# Patient Record
Sex: Male | Born: 1970 | Race: Black or African American | Hispanic: No | Smoking: Current every day smoker
Health system: Southern US, Community
[De-identification: ages and names within clinical notes are randomized; demographics above are authoritative.]

## PROBLEM LIST (undated history)

## (undated) DIAGNOSIS — I2510 Atherosclerotic heart disease of native coronary artery without angina pectoris: Secondary | ICD-10-CM

## (undated) DIAGNOSIS — E11610 Type 2 diabetes mellitus with diabetic neuropathic arthropathy: Secondary | ICD-10-CM

## (undated) DIAGNOSIS — I209 Angina pectoris, unspecified: Secondary | ICD-10-CM

## (undated) DIAGNOSIS — D649 Anemia, unspecified: Secondary | ICD-10-CM

## (undated) DIAGNOSIS — T7840XA Allergy, unspecified, initial encounter: Secondary | ICD-10-CM

## (undated) DIAGNOSIS — Z720 Tobacco use: Secondary | ICD-10-CM

## (undated) DIAGNOSIS — K5900 Constipation, unspecified: Secondary | ICD-10-CM

## (undated) DIAGNOSIS — E1165 Type 2 diabetes mellitus with hyperglycemia: Secondary | ICD-10-CM

## (undated) DIAGNOSIS — G709 Myoneural disorder, unspecified: Secondary | ICD-10-CM

## (undated) DIAGNOSIS — IMO0002 Reserved for concepts with insufficient information to code with codable children: Secondary | ICD-10-CM

## (undated) DIAGNOSIS — H409 Unspecified glaucoma: Secondary | ICD-10-CM

## (undated) DIAGNOSIS — Z9989 Dependence on other enabling machines and devices: Secondary | ICD-10-CM

## (undated) DIAGNOSIS — Z973 Presence of spectacles and contact lenses: Secondary | ICD-10-CM

## (undated) DIAGNOSIS — E785 Hyperlipidemia, unspecified: Secondary | ICD-10-CM

## (undated) DIAGNOSIS — T8781 Dehiscence of amputation stump: Secondary | ICD-10-CM

## (undated) DIAGNOSIS — I10 Essential (primary) hypertension: Secondary | ICD-10-CM

## (undated) DIAGNOSIS — N189 Chronic kidney disease, unspecified: Secondary | ICD-10-CM

## (undated) HISTORY — DX: Type 2 diabetes mellitus with hyperglycemia: E11.65

## (undated) HISTORY — PX: OTHER SURGICAL HISTORY: SHX169

## (undated) HISTORY — PX: EYE SURGERY: SHX253

## (undated) HISTORY — DX: Anemia, unspecified: D64.9

## (undated) HISTORY — DX: Unspecified glaucoma: H40.9

## (undated) HISTORY — DX: Allergy, unspecified, initial encounter: T78.40XA

## (undated) HISTORY — DX: Tobacco use: Z72.0

## (undated) HISTORY — DX: Type 2 diabetes mellitus with diabetic neuropathic arthropathy: E11.610

## (undated) HISTORY — DX: Dehiscence of amputation stump: T87.81

## (undated) HISTORY — DX: Reserved for concepts with insufficient information to code with codable children: IMO0002

---

## 1998-02-04 ENCOUNTER — Encounter: Payer: Self-pay | Admitting: Internal Medicine

## 1998-02-04 ENCOUNTER — Emergency Department (HOSPITAL_COMMUNITY): Admission: EM | Admit: 1998-02-04 | Discharge: 1998-02-04 | Payer: Self-pay | Admitting: Internal Medicine

## 1998-05-18 ENCOUNTER — Encounter: Payer: Self-pay | Admitting: Emergency Medicine

## 1998-05-18 ENCOUNTER — Emergency Department (HOSPITAL_COMMUNITY): Admission: EM | Admit: 1998-05-18 | Discharge: 1998-05-18 | Payer: Self-pay | Admitting: Emergency Medicine

## 1998-11-06 ENCOUNTER — Emergency Department (HOSPITAL_COMMUNITY): Admission: EM | Admit: 1998-11-06 | Discharge: 1998-11-06 | Payer: Self-pay | Admitting: Emergency Medicine

## 2000-08-17 ENCOUNTER — Emergency Department (HOSPITAL_COMMUNITY): Admission: EM | Admit: 2000-08-17 | Discharge: 2000-08-17 | Payer: Self-pay | Admitting: Emergency Medicine

## 2000-08-19 ENCOUNTER — Encounter: Admission: RE | Admit: 2000-08-19 | Discharge: 2000-08-19 | Payer: Self-pay | Admitting: Family Medicine

## 2001-07-26 ENCOUNTER — Emergency Department (HOSPITAL_COMMUNITY): Admission: EM | Admit: 2001-07-26 | Discharge: 2001-07-26 | Payer: Self-pay | Admitting: Emergency Medicine

## 2001-07-26 ENCOUNTER — Encounter: Payer: Self-pay | Admitting: Emergency Medicine

## 2001-12-18 ENCOUNTER — Emergency Department (HOSPITAL_COMMUNITY): Admission: EM | Admit: 2001-12-18 | Discharge: 2001-12-18 | Payer: Self-pay | Admitting: Emergency Medicine

## 2001-12-22 ENCOUNTER — Encounter: Admission: RE | Admit: 2001-12-22 | Discharge: 2001-12-22 | Payer: Self-pay | Admitting: Internal Medicine

## 2003-03-20 ENCOUNTER — Emergency Department (HOSPITAL_COMMUNITY): Admission: EM | Admit: 2003-03-20 | Discharge: 2003-03-20 | Payer: Self-pay | Admitting: Emergency Medicine

## 2003-09-04 ENCOUNTER — Emergency Department (HOSPITAL_COMMUNITY): Admission: EM | Admit: 2003-09-04 | Discharge: 2003-09-04 | Payer: Self-pay | Admitting: Emergency Medicine

## 2006-05-04 ENCOUNTER — Emergency Department (HOSPITAL_COMMUNITY): Admission: EM | Admit: 2006-05-04 | Discharge: 2006-05-04 | Payer: Self-pay | Admitting: Emergency Medicine

## 2006-05-26 DIAGNOSIS — E1165 Type 2 diabetes mellitus with hyperglycemia: Secondary | ICD-10-CM

## 2006-05-26 DIAGNOSIS — E1149 Type 2 diabetes mellitus with other diabetic neurological complication: Secondary | ICD-10-CM | POA: Insufficient documentation

## 2007-10-25 ENCOUNTER — Emergency Department (HOSPITAL_COMMUNITY): Admission: EM | Admit: 2007-10-25 | Discharge: 2007-10-25 | Payer: Self-pay | Admitting: Emergency Medicine

## 2008-01-19 ENCOUNTER — Encounter: Payer: Self-pay | Admitting: Family Medicine

## 2008-01-19 ENCOUNTER — Ambulatory Visit: Payer: Self-pay | Admitting: Internal Medicine

## 2008-01-19 LAB — CONVERTED CEMR LAB
ALT: 9 units/L (ref 0–53)
AST: 9 units/L (ref 0–37)
Albumin: 4.3 g/dL (ref 3.5–5.2)
Alkaline Phosphatase: 80 units/L (ref 39–117)
BUN: 12 mg/dL (ref 6–23)
Basophils Absolute: 0 10*3/uL (ref 0.0–0.1)
Basophils Relative: 0 % (ref 0–1)
Calcium: 9.5 mg/dL (ref 8.4–10.5)
Chloride: 105 meq/L (ref 96–112)
Eosinophils Absolute: 0.1 10*3/uL (ref 0.0–0.7)
HDL: 44 mg/dL (ref >39)
LDL Cholesterol: 46 mg/dL (ref 0–99)
MCHC: 32.6 g/dL (ref 30.0–36.0)
MCV: 83.7 fL (ref 78.0–100.0)
Monocytes Relative: 6 % (ref 3–12)
Neutro Abs: 4.5 10*3/uL (ref 1.7–7.7)
Neutrophils Relative %: 60 % (ref 43–77)
Platelets: 239 10*3/uL (ref 150–400)
Potassium: 4.2 meq/L (ref 3.5–5.3)
RBC: 5.65 M/uL (ref 4.22–5.81)
RDW: 14.8 % (ref 11.5–15.5)
Sodium: 140 meq/L (ref 135–145)
TSH: 1.235 microintl units/mL (ref 0.350–4.50)

## 2008-01-25 ENCOUNTER — Ambulatory Visit: Payer: Self-pay | Admitting: *Deleted

## 2008-04-19 ENCOUNTER — Ambulatory Visit: Payer: Self-pay | Admitting: Internal Medicine

## 2008-04-19 ENCOUNTER — Encounter: Payer: Self-pay | Admitting: Family Medicine

## 2008-04-19 LAB — CONVERTED CEMR LAB
AST: 9 units/L (ref 0–37)
Albumin: 4.2 g/dL (ref 3.5–5.2)
Alkaline Phosphatase: 71 units/L (ref 39–117)
Angiotensin 1 Converting Enzyme: 12 units/L (ref 9–67)
Basophils Absolute: 0 10*3/uL (ref 0.0–0.1)
Basophils Relative: 0 % (ref 0–1)
Chloride: 103 meq/L (ref 96–112)
Lymphocytes Relative: 33 % (ref 12–46)
MCHC: 32.3 g/dL (ref 30.0–36.0)
Monocytes Relative: 6 % (ref 3–12)
Neutro Abs: 6 10*3/uL (ref 1.7–7.7)
Neutrophils Relative %: 60 % (ref 43–77)
Potassium: 4.2 meq/L (ref 3.5–5.3)
RBC: 5.53 M/uL (ref 4.22–5.81)
RDW: 14.9 % (ref 11.5–15.5)
Sodium: 142 meq/L (ref 135–145)
Total Protein: 7.6 g/dL (ref 6.0–8.3)

## 2008-05-24 ENCOUNTER — Ambulatory Visit: Payer: Self-pay | Admitting: Internal Medicine

## 2008-05-27 ENCOUNTER — Ambulatory Visit: Payer: Self-pay | Admitting: Internal Medicine

## 2008-06-19 ENCOUNTER — Emergency Department (HOSPITAL_COMMUNITY): Admission: EM | Admit: 2008-06-19 | Discharge: 2008-06-19 | Payer: Self-pay | Admitting: Emergency Medicine

## 2008-07-22 ENCOUNTER — Ambulatory Visit: Payer: Self-pay | Admitting: Internal Medicine

## 2008-09-28 ENCOUNTER — Emergency Department (HOSPITAL_COMMUNITY): Admission: EM | Admit: 2008-09-28 | Discharge: 2008-09-28 | Payer: Self-pay | Admitting: Emergency Medicine

## 2009-06-01 ENCOUNTER — Emergency Department (HOSPITAL_COMMUNITY): Admission: EM | Admit: 2009-06-01 | Discharge: 2009-06-01 | Payer: Self-pay | Admitting: Emergency Medicine

## 2009-12-06 ENCOUNTER — Emergency Department (HOSPITAL_COMMUNITY): Admission: EM | Admit: 2009-12-06 | Discharge: 2009-12-06 | Payer: Self-pay | Admitting: Emergency Medicine

## 2010-12-25 LAB — URINE MICROSCOPIC-ADD ON

## 2010-12-25 LAB — DIFFERENTIAL
Eosinophils Relative: 2
Lymphocytes Relative: 23
Lymphs Abs: 2.3
Neutrophils Relative %: 73

## 2010-12-25 LAB — COMPREHENSIVE METABOLIC PANEL
AST: 10
CO2: 29
Calcium: 9.2
Creatinine, Ser: 0.72
GFR calc Af Amer: >60
GFR calc non Af Amer: >60

## 2010-12-25 LAB — URINALYSIS, ROUTINE W REFLEX MICROSCOPIC
Bilirubin Urine: NEGATIVE
Leukocytes, UA: NEGATIVE
Nitrite: NEGATIVE
Specific Gravity, Urine: 1.046 — ABNORMAL HIGH
Urobilinogen, UA: 1

## 2010-12-25 LAB — CBC
MCHC: 32.8
MCV: 83.8
RBC: 5.7
RDW: 15.1

## 2010-12-25 LAB — LIPASE, BLOOD: Lipase: 14

## 2011-04-01 ENCOUNTER — Emergency Department (HOSPITAL_COMMUNITY)
Admission: EM | Admit: 2011-04-01 | Discharge: 2011-04-02 | Disposition: A | Discharge status: Home / Self Care | Payer: Self-pay | Attending: Emergency Medicine | Admitting: Emergency Medicine

## 2011-04-01 DIAGNOSIS — Z0389 Encounter for observation for other suspected diseases and conditions ruled out: Secondary | ICD-10-CM | POA: Insufficient documentation

## 2011-04-01 HISTORY — DX: Essential (primary) hypertension: I10

## 2011-04-02 ENCOUNTER — Encounter: Payer: Self-pay | Admitting: *Deleted

## 2011-04-02 ENCOUNTER — Emergency Department (HOSPITAL_COMMUNITY): Payer: Self-pay
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
## 2011-04-02 NOTE — ED Notes (Signed)
Pt not found in departmrnt

## 2011-04-22 ENCOUNTER — Emergency Department (HOSPITAL_COMMUNITY)
Admission: EM | Admit: 2011-04-22 | Discharge: 2011-04-22 | Disposition: A | Discharge status: Home / Self Care | Payer: Self-pay | Attending: Emergency Medicine | Admitting: Emergency Medicine

## 2011-04-22 ENCOUNTER — Encounter (HOSPITAL_COMMUNITY): Payer: Self-pay | Admitting: Emergency Medicine

## 2011-04-22 ENCOUNTER — Emergency Department (HOSPITAL_COMMUNITY): Payer: Self-pay

## 2011-04-22 DIAGNOSIS — X58XXXA Exposure to other specified factors, initial encounter: Secondary | ICD-10-CM | POA: Insufficient documentation

## 2011-04-22 DIAGNOSIS — S4990XA Unspecified injury of shoulder and upper arm, unspecified arm, initial encounter: Secondary | ICD-10-CM

## 2011-04-22 DIAGNOSIS — E119 Type 2 diabetes mellitus without complications: Secondary | ICD-10-CM | POA: Insufficient documentation

## 2011-04-22 DIAGNOSIS — S4980XA Other specified injuries of shoulder and upper arm, unspecified arm, initial encounter: Secondary | ICD-10-CM | POA: Insufficient documentation

## 2011-04-22 DIAGNOSIS — I10 Essential (primary) hypertension: Secondary | ICD-10-CM | POA: Insufficient documentation

## 2011-04-22 DIAGNOSIS — S46909A Unspecified injury of unspecified muscle, fascia and tendon at shoulder and upper arm level, unspecified arm, initial encounter: Secondary | ICD-10-CM | POA: Insufficient documentation

## 2011-04-22 MED ORDER — KETOROLAC TROMETHAMINE 60 MG/2ML IM SOLN
60.0000 mg | Freq: Once | INTRAMUSCULAR | Status: AC
  Administered 2011-04-22: 60 mg via INTRAMUSCULAR
  Filled 2011-04-22: qty 2

## 2011-04-22 MED ORDER — PREDNISONE 10 MG PO TABS: 50.0000 mg | ORAL_TABLET | Freq: Every day | ORAL | Status: DC

## 2011-04-22 MED ORDER — KETOROLAC TROMETHAMINE 30 MG/ML IJ SOLN
INTRAMUSCULAR | Status: AC
  Filled 2011-04-22: qty 1

## 2011-04-22 MED ORDER — TRAMADOL-ACETAMINOPHEN 37.5-325 MG PO TABS: 1.0000 | ORAL_TABLET | Freq: Four times a day (QID) | ORAL | Status: AC | PRN

## 2011-04-22 NOTE — ED Notes (Signed)
Pt states he was changing tire a month ago and hurt his R shoulder. Pt states he has pain in R shoulder and tingling and weakness in R hand and has been dropping things when using that hand. Pt reports occasional pain to R hand but mostly in R shoulder.Pt denies pain med use today.

## 2011-04-22 NOTE — ED Provider Notes (Signed)
History     CSN: [id number]  Arrival date & time 04/22/11  49   First MD Initiated Contact with Patient 04/22/11 1756      Chief Complaint  Patient presents with  . Arm Injury    (Consider location/radiation/quality/duration/timing/severity/associated sxs/prior treatment) HPI  Pt presents to the ED with complaints of shoulder pain. He was changing his tired approx 1 month ago when he injured his right shoulder. Since then he describes symptoms of tingling and weakness in his Right hand and feels as though he has been dropping things. He denies any neck pain or injury, denies inability to use R arm.   Past Medical History  Diagnosis Date  . Diabetes mellitus   . Hypertension     History reviewed. No pertinent past surgical history.  Family History  Problem Relation Age of Onset  . Cancer Mother   . Diabetes Father   . Hypertension Father     History  Substance Use Topics  . Smoking status: Current Everyday Smoker  . Smokeless tobacco: Not on file  . Alcohol Use: No      Review of Systems  All other systems reviewed and are negative.    Allergies  Penicillins  Home Medications   Current Outpatient Rx  Name Route Sig Dispense Refill  . PREDNISONE 10 MG PO TABS Oral Take 5 tablets (50 mg total) by mouth daily. 7 tablet 0  . TRAMADOL-ACETAMINOPHEN 37.5-325 MG PO TABS Oral Take 1 tablet by mouth every 6 (six) hours as needed for pain. 30 tablet 0    BP 147/95  Pulse 102  Temp 98.6 F (37 C)  SpO2 100%  Physical Exam  Nursing note and vitals reviewed. Constitutional: He appears well-developed and well-nourished.  HENT:  Head: Normocephalic and atraumatic.  Cardiovascular: Normal rate.   Pulmonary/Chest: Effort normal.  Musculoskeletal:       Right shoulder: He exhibits decreased range of motion, tenderness, bony tenderness, effusion and spasm. He exhibits no swelling, no crepitus, no deformity, no laceration, no pain, normal pulse and normal  strength.  Skin: Skin is warm and dry.    ED Course  Procedures (including critical care time)  Labs Reviewed - No data to display Dg Shoulder Right  04/22/2011  *RADIOLOGY REPORT*  Clinical Data: Injured right shoulder pain for 1 month.  No injury.  RIGHT SHOULDER - 2+ VIEW  Comparison: 04/02/2011  Findings: Degenerative change AC joint and glenohumeral joint.  No fracture or dislocation.  No acute bony abnormality.  Soft tissues intact.  IMPRESSION: Degenerative changes.  No acute findings.  No change from priors.  Original Report Authenticated By: Staci Righter, M.D.     1. Shoulder injury       MDM  Pt given a sling, referral to ortho, a strong antiinflammatory and a small Rx for [place], [place] 04/22/11 1948

## 2011-04-23 ENCOUNTER — Encounter (HOSPITAL_COMMUNITY): Payer: Self-pay | Admitting: *Deleted

## 2011-04-23 ENCOUNTER — Emergency Department (HOSPITAL_COMMUNITY)
Admission: EM | Admit: 2011-04-23 | Discharge: 2011-04-23 | Disposition: A | Discharge status: Home / Self Care | Payer: Self-pay | Attending: Emergency Medicine | Admitting: Emergency Medicine

## 2011-04-23 DIAGNOSIS — R221 Localized swelling, mass and lump, neck: Secondary | ICD-10-CM | POA: Insufficient documentation

## 2011-04-23 DIAGNOSIS — E119 Type 2 diabetes mellitus without complications: Secondary | ICD-10-CM | POA: Insufficient documentation

## 2011-04-23 DIAGNOSIS — R220 Localized swelling, mass and lump, head: Secondary | ICD-10-CM | POA: Insufficient documentation

## 2011-04-23 DIAGNOSIS — Z79899 Other long term (current) drug therapy: Secondary | ICD-10-CM | POA: Insufficient documentation

## 2011-04-23 DIAGNOSIS — K0889 Other specified disorders of teeth and supporting structures: Secondary | ICD-10-CM

## 2011-04-23 DIAGNOSIS — I10 Essential (primary) hypertension: Secondary | ICD-10-CM | POA: Insufficient documentation

## 2011-04-23 DIAGNOSIS — R6883 Chills (without fever): Secondary | ICD-10-CM | POA: Insufficient documentation

## 2011-04-23 DIAGNOSIS — K089 Disorder of teeth and supporting structures, unspecified: Secondary | ICD-10-CM | POA: Insufficient documentation

## 2011-04-23 DIAGNOSIS — R739 Hyperglycemia, unspecified: Secondary | ICD-10-CM

## 2011-04-23 DIAGNOSIS — R51 Headache: Secondary | ICD-10-CM | POA: Insufficient documentation

## 2011-04-23 LAB — GLUCOSE, CAPILLARY
Glucose-Capillary: 513 mg/dL — ABNORMAL HIGH (ref 70–99)
Glucose-Capillary: 407 mg/dL — ABNORMAL HIGH (ref 70–99)
Glucose-Capillary: 246 mg/dL — ABNORMAL HIGH (ref 70–99)

## 2011-04-23 LAB — POCT I-STAT, CHEM 8
Chloride: 100 mEq/L (ref 96–112)
HCT: 48 % (ref 39.0–52.0)
Potassium: 4.2 mEq/L (ref 3.5–5.1)

## 2011-04-23 MED ORDER — INSULIN REGULAR HUMAN 100 UNIT/ML IJ SOLN
10.0000 [IU] | Freq: Once | INTRAMUSCULAR | Status: AC
  Administered 2011-04-23: 10 [IU] via INTRAVENOUS
  Filled 2011-04-23: qty 0.1

## 2011-04-23 MED ORDER — SODIUM CHLORIDE 0.9 % IV BOLUS (SEPSIS): 1000.0000 mL | Freq: Once | INTRAVENOUS | Status: DC

## 2011-04-23 MED ORDER — LISINOPRIL 10 MG PO TABS: 10.0000 mg | ORAL_TABLET | Freq: Every day | ORAL | Status: DC

## 2011-04-23 MED ORDER — METFORMIN HCL 500 MG PO TABS: 500.0000 mg | ORAL_TABLET | Freq: Two times a day (BID) | ORAL | Status: DC

## 2011-04-23 MED ORDER — CLINDAMYCIN HCL 150 MG PO CAPS: 300.0000 mg | ORAL_CAPSULE | Freq: Three times a day (TID) | ORAL | Status: AC

## 2011-04-23 MED ORDER — SODIUM CHLORIDE 0.9 % IV BOLUS (SEPSIS)
1000.0000 mL | Freq: Once | INTRAVENOUS | Status: AC
  Administered 2011-04-23: 1000 mL via INTRAVENOUS

## 2011-04-23 MED ORDER — INSULIN REGULAR HUMAN 100 UNIT/ML IJ SOLN: 10.0000 [IU] | Freq: Once | INTRAMUSCULAR | Status: DC

## 2011-04-23 MED ORDER — ACETAMINOPHEN 500 MG PO TABS
1000.0000 mg | ORAL_TABLET | Freq: Once | ORAL | Status: AC
  Administered 2011-04-23: 1000 mg via ORAL
  Filled 2011-04-23: qty 2

## 2011-04-23 NOTE — ED Notes (Signed)
Pt brought in via ems and taken to room 3 and made comfortable. Pt is alert and oriented and able to make needs known.

## 2011-04-23 NOTE — ED Notes (Signed)
[id number] Expected date:04/23/11<BR> Expected time: 2:12 AM<BR> Means of arrival:Ambulance<BR> Comments:<BR> Headache, hyperglycemic

## 2011-04-23 NOTE — ED Provider Notes (Signed)
History     CSN: [id number]  Arrival date & time 04/23/11  [id number]   First MD Initiated Contact with Patient 04/23/11 0248      Chief Complaint  Patient presents with  . Headache    started at 0130 pt was seen in ED on 04/22/11 for right shoulder pain. pt was d/c from ED around 2000 on 04/22/11. pt woke up this am with h/a and shaking. pt is noncompliant with taking medications. pt has not taken b/p or DM meds.   . Shaking    started at 0130    (Consider location/radiation/quality/duration/timing/severity/associated sxs/prior treatment) HPI Comments: The patient is a 41 year old male with a history of diabetes and hypertension who is noncompliant with his medications for this he says over 1 year. He states that he was here earlier in the emergency department and had an x-ray of his shoulder along with an intramuscular shot of Toradol. This helped his shoulder pain and currently he has no pain in this area. Upon falling asleep tonight he awoke with pain in his left temporal area, dental pain and pain along his teeth with swelling of his gums. This was associated with chills but denies fevers nausea vomiting shortness of breath cough or difficulty swallowing. The symptoms were acute in onset, constant, nothing makes better or worse. He denies any recent dental work or trauma to his face or teeth.  The history is provided by the patient and medical records.    Past Medical History  Diagnosis Date  . Diabetes mellitus   . Hypertension     History reviewed. No pertinent past surgical history.  Family History  Problem Relation Age of Onset  . Cancer Mother   . Diabetes Father   . Hypertension Father     History  Substance Use Topics  . Smoking status: Current Everyday Smoker  . Smokeless tobacco: Not on file  . Alcohol Use: No      Review of Systems  Constitutional: Positive for chills. Negative for fever.  HENT: Positive for dental problem. Negative for ear pain, sore throat,  facial swelling, rhinorrhea, mouth sores, trouble swallowing, neck pain, neck stiffness and voice change.   Cardiovascular: Negative for chest pain and leg swelling.  Gastrointestinal: Negative for vomiting.  Skin: Negative for rash.  Neurological: Positive for headaches.    Allergies  Penicillins  Home Medications   Current Outpatient Rx  Name Route Sig Dispense Refill  . CLINDAMYCIN HCL 150 MG PO CAPS Oral Take 2 capsules (300 mg total) by mouth 3 (three) times daily. May dispense as 150mg  capsules 60 capsule 0  . LISINOPRIL 10 MG PO TABS Oral Take 1 tablet (10 mg total) by mouth daily. 30 tablet 1  . METFORMIN HCL 500 MG PO TABS Oral Take 1 tablet (500 mg total) by mouth 2 (two) times daily with a meal. 60 tablet 1  . PREDNISONE 10 MG PO TABS Oral Take 5 tablets (50 mg total) by mouth daily. 7 tablet 0  . TRAMADOL-ACETAMINOPHEN 37.5-325 MG PO TABS Oral Take 1 tablet by mouth every 6 (six) hours as needed for pain. 30 tablet 0    BP 130/82  Pulse 100  Temp(Src) 98.7 F (37.1 C) (Oral)  Resp 20  Ht 5\' 4"  (1.626 m)  SpO2 100%  Physical Exam  Nursing note and vitals reviewed. Constitutional: He appears well-developed and well-nourished. No distress.  HENT:  Head: Normocephalic and atraumatic.  Mouth/Throat: No oropharyngeal exudate.       Diffuse  mild dental disease with caries, gingival tenderness but no signs of abscesses, no angioedema of the tongue the lips or the gingiva. Oropharynx is clear without swelling, exudate, asymmetry or hypertrophy.  TM's normal bilaterally  Eyes: Conjunctivae and EOM are normal. Pupils are equal, round, and reactive to light. Right eye exhibits no discharge. Left eye exhibits no discharge. No scleral icterus.  Neck: Normal range of motion. Neck supple. No JVD present. No thyromegaly present.  Cardiovascular: Normal rate, regular rhythm, normal heart sounds and intact distal pulses.  Exam reveals no gallop and no friction rub.   No murmur  heard. Pulmonary/Chest: Effort normal and breath sounds normal. No respiratory distress. He has no wheezes. He has no rales.  Abdominal: Soft. Bowel sounds are normal. He exhibits no distension and no mass. There is no tenderness.  Musculoskeletal: Normal range of motion. He exhibits no edema and no tenderness.  Lymphadenopathy:    He has no cervical adenopathy.  Neurological: He is alert. Coordination normal.  Skin: Skin is warm and dry. No rash noted. No erythema.  Psychiatric: He has a normal mood and affect. His behavior is normal.    ED Course  Procedures (including critical care time)  Labs Reviewed  GLUCOSE, CAPILLARY - Abnormal; Notable for the following:    Glucose-Capillary 513 (*)    All other components within normal limits  POCT I-STAT, CHEM 8 - Abnormal; Notable for the following:    Glucose, Bld 527 (*)    All other components within normal limits  GLUCOSE, CAPILLARY - Abnormal; Notable for the following:    Glucose-Capillary 407 (*)    All other components within normal limits  POCT CBG MONITORING  I-STAT, CHEM 8   Dg Shoulder Right  04/22/2011  *RADIOLOGY REPORT*  Clinical Data: Injured right shoulder pain for 1 month.  No injury.  RIGHT SHOULDER - 2+ VIEW  Comparison: 04/02/2011  Findings: Degenerative change AC joint and glenohumeral joint.  No fracture or dislocation.  No acute bony abnormality.  Soft tissues intact.  IMPRESSION: Degenerative changes.  No acute findings.  No change from priors.  Original Report Authenticated By: Staci Righter, M.D.     1. Hyperglycemia   2. Hypertension   3. Toothache   4. Headache       MDM  Physical exam consistent with dental disease, no drainable abscess is present, patient will require outpatient anti-inflammatories as well as antibiotics. Blood pressure is elevated at 160/100. CBG pending  Elect light panel shows no signs of anion gap acidosis, blood sugar was initially 500, then reduced to 400 with fluids and  after insulin more fluids it came down to 240 or so. Patient is feeling much better, 2 L of IV fluids and given and patient is tolerating by mouth. I will start him back on some home medications including lisinopril, metformin as well as an antibiotic clindamycin for his dental work. I will also give him followup instructions and phone numbers for both dental work and medical care. I spoke with the patient explaining his results and his need for close followup. He has expressed his understanding and is amenable to discharge.  Discharge Prescriptions include:  #1 clindamycin #2 lisinopril #3 metformin    Johnna Acosta, MD 04/23/11 [phone]

## 2011-04-23 NOTE — ED Provider Notes (Signed)
Medical screening examination/treatment/procedure(s) were performed by non-physician practitioner and as supervising physician I was immediately available for consultation/collaboration. Rolland Porter, MD, Abram Sander   Janice Norrie, MD 04/23/11 [phone]

## 2012-01-08 ENCOUNTER — Emergency Department (HOSPITAL_COMMUNITY)
Admission: EM | Admit: 2012-01-08 | Discharge: 2012-01-08 | Disposition: A | Discharge status: Home / Self Care | Payer: Self-pay | Attending: Emergency Medicine | Admitting: Emergency Medicine

## 2012-01-08 ENCOUNTER — Encounter (HOSPITAL_COMMUNITY): Payer: Self-pay | Admitting: *Deleted

## 2012-01-08 ENCOUNTER — Emergency Department (HOSPITAL_COMMUNITY): Payer: Self-pay

## 2012-01-08 DIAGNOSIS — S20219A Contusion of unspecified front wall of thorax, initial encounter: Secondary | ICD-10-CM | POA: Insufficient documentation

## 2012-01-08 DIAGNOSIS — E119 Type 2 diabetes mellitus without complications: Secondary | ICD-10-CM | POA: Insufficient documentation

## 2012-01-08 DIAGNOSIS — Z880 Allergy status to penicillin: Secondary | ICD-10-CM | POA: Insufficient documentation

## 2012-01-08 DIAGNOSIS — Y92009 Unspecified place in unspecified non-institutional (private) residence as the place of occurrence of the external cause: Secondary | ICD-10-CM | POA: Insufficient documentation

## 2012-01-08 DIAGNOSIS — IMO0002 Reserved for concepts with insufficient information to code with codable children: Secondary | ICD-10-CM | POA: Insufficient documentation

## 2012-01-08 DIAGNOSIS — I10 Essential (primary) hypertension: Secondary | ICD-10-CM | POA: Insufficient documentation

## 2012-01-08 DIAGNOSIS — F17200 Nicotine dependence, unspecified, uncomplicated: Secondary | ICD-10-CM | POA: Insufficient documentation

## 2012-01-08 MED ORDER — HYDROCODONE-ACETAMINOPHEN 5-325 MG PO TABS
2.0000 | ORAL_TABLET | Freq: Once | ORAL | Status: AC
  Administered 2012-01-08: 2 via ORAL
  Filled 2012-01-08: qty 2

## 2012-01-08 MED ORDER — IBUPROFEN 400 MG PO TABS
600.0000 mg | ORAL_TABLET | Freq: Once | ORAL | Status: AC
  Administered 2012-01-08: 600 mg via ORAL
  Filled 2012-01-08: qty 1

## 2012-01-08 MED ORDER — IBUPROFEN 600 MG PO TABS: 600.0000 mg | ORAL_TABLET | Freq: Four times a day (QID) | ORAL | Status: DC | PRN

## 2012-01-08 MED ORDER — METHOCARBAMOL 500 MG PO TABS
500.0000 mg | ORAL_TABLET | Freq: Once | ORAL | Status: AC
  Administered 2012-01-08: 500 mg via ORAL
  Filled 2012-01-08: qty 1

## 2012-01-08 MED ORDER — HYDROCODONE-ACETAMINOPHEN 5-325 MG PO TABS: 1.0000 | ORAL_TABLET | Freq: Four times a day (QID) | ORAL | Status: DC | PRN

## 2012-01-08 MED ORDER — METHOCARBAMOL 500 MG PO TABS: 500.0000 mg | ORAL_TABLET | Freq: Two times a day (BID) | ORAL | Status: DC
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
## 2012-01-11 ENCOUNTER — Emergency Department (HOSPITAL_COMMUNITY): Payer: Self-pay

## 2012-01-11 ENCOUNTER — Encounter (HOSPITAL_COMMUNITY): Payer: Self-pay | Admitting: Emergency Medicine

## 2012-01-11 ENCOUNTER — Emergency Department (HOSPITAL_COMMUNITY)
Admission: EM | Admit: 2012-01-11 | Discharge: 2012-01-11 | Disposition: A | Discharge status: Home / Self Care | Payer: Self-pay | Attending: Emergency Medicine | Admitting: Emergency Medicine

## 2012-01-11 DIAGNOSIS — R05 Cough: Secondary | ICD-10-CM | POA: Insufficient documentation

## 2012-01-11 DIAGNOSIS — S20219A Contusion of unspecified front wall of thorax, initial encounter: Secondary | ICD-10-CM | POA: Insufficient documentation

## 2012-01-11 DIAGNOSIS — R0602 Shortness of breath: Secondary | ICD-10-CM | POA: Insufficient documentation

## 2012-01-11 DIAGNOSIS — IMO0002 Reserved for concepts with insufficient information to code with codable children: Secondary | ICD-10-CM | POA: Insufficient documentation

## 2012-01-11 DIAGNOSIS — R059 Cough, unspecified: Secondary | ICD-10-CM | POA: Insufficient documentation

## 2012-01-11 MED ORDER — OXYCODONE-ACETAMINOPHEN 5-325 MG PO TABS
2.0000 | ORAL_TABLET | Freq: Once | ORAL | Status: AC
  Administered 2012-01-11: 2 via ORAL
  Filled 2012-01-11: qty 2

## 2012-01-11 MED ORDER — OXYCODONE-ACETAMINOPHEN 5-325 MG PO TABS: 1.0000 | ORAL_TABLET | Freq: Four times a day (QID) | ORAL | Status: DC | PRN
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
## 2012-01-11 NOTE — ED Provider Notes (Signed)
Medical screening examination/treatment/procedure(s) were performed by non-physician practitioner and as supervising physician I was immediately available for consultation/collaboration.  Jasper Riling. Alvino Chapel, MD 01/11/12 1556

## 2012-07-24 ENCOUNTER — Encounter (HOSPITAL_COMMUNITY): Payer: Self-pay | Admitting: Emergency Medicine

## 2012-07-24 ENCOUNTER — Emergency Department (HOSPITAL_COMMUNITY): Payer: Self-pay

## 2012-07-24 ENCOUNTER — Emergency Department (HOSPITAL_COMMUNITY)
Admission: EM | Admit: 2012-07-24 | Discharge: 2012-07-24 | Disposition: A | Discharge status: Home / Self Care | Payer: Self-pay | Attending: Emergency Medicine | Admitting: Emergency Medicine

## 2012-07-24 DIAGNOSIS — Z79899 Other long term (current) drug therapy: Secondary | ICD-10-CM | POA: Insufficient documentation

## 2012-07-24 DIAGNOSIS — K219 Gastro-esophageal reflux disease without esophagitis: Secondary | ICD-10-CM | POA: Insufficient documentation

## 2012-07-24 DIAGNOSIS — K5900 Constipation, unspecified: Secondary | ICD-10-CM | POA: Insufficient documentation

## 2012-07-24 DIAGNOSIS — R0789 Other chest pain: Secondary | ICD-10-CM

## 2012-07-24 DIAGNOSIS — Z7982 Long term (current) use of aspirin: Secondary | ICD-10-CM | POA: Insufficient documentation

## 2012-07-24 DIAGNOSIS — Z860100 Personal history of colon polyps, unspecified: Secondary | ICD-10-CM | POA: Insufficient documentation

## 2012-07-24 DIAGNOSIS — I4891 Unspecified atrial fibrillation: Secondary | ICD-10-CM | POA: Insufficient documentation

## 2012-07-24 DIAGNOSIS — R51 Headache: Secondary | ICD-10-CM

## 2012-07-24 DIAGNOSIS — I10 Essential (primary) hypertension: Secondary | ICD-10-CM | POA: Insufficient documentation

## 2012-07-24 DIAGNOSIS — Z87442 Personal history of urinary calculi: Secondary | ICD-10-CM | POA: Insufficient documentation

## 2012-07-24 DIAGNOSIS — Z9889 Other specified postprocedural states: Secondary | ICD-10-CM | POA: Insufficient documentation

## 2012-07-24 DIAGNOSIS — Z9089 Acquired absence of other organs: Secondary | ICD-10-CM | POA: Insufficient documentation

## 2012-07-24 DIAGNOSIS — Z8701 Personal history of pneumonia (recurrent): Secondary | ICD-10-CM | POA: Insufficient documentation

## 2012-07-24 DIAGNOSIS — Z87891 Personal history of nicotine dependence: Secondary | ICD-10-CM | POA: Insufficient documentation

## 2012-07-24 DIAGNOSIS — R109 Unspecified abdominal pain: Secondary | ICD-10-CM | POA: Insufficient documentation

## 2012-07-24 LAB — CSF CELL COUNT WITH DIFFERENTIAL
RBC Count, CSF: 0 /mm3
Tube #: 2

## 2012-07-24 LAB — BASIC METABOLIC PANEL
Chloride: 101 mEq/L (ref 96–112)
GFR calc Af Amer: >90 mL/min (ref >90)
GFR calc non Af Amer: >90 mL/min (ref >90)
Potassium: 4.5 mEq/L (ref 3.5–5.1)
Sodium: 137 mEq/L (ref 135–145)

## 2012-07-24 LAB — URINALYSIS, ROUTINE W REFLEX MICROSCOPIC
Bilirubin Urine: NEGATIVE
Glucose, UA: 100 mg/dL — AB
Hgb urine dipstick: NEGATIVE
Ketones, ur: NEGATIVE mg/dL
Protein, ur: NEGATIVE mg/dL

## 2012-07-24 LAB — PROTIME-INR: Prothrombin Time: 12.9 seconds (ref 11.6–15.2)

## 2012-07-24 LAB — POCT I-STAT TROPONIN I: Troponin i, poc: 0.01 ng/mL (ref 0.00–0.08)

## 2012-07-24 LAB — GRAM STAIN

## 2012-07-24 LAB — CBC
MCHC: 29.9 g/dL — ABNORMAL LOW (ref 30.0–36.0)
Platelets: 195 10*3/uL (ref 150–400)
RDW: 15.1 % (ref 11.5–15.5)
WBC: 11.3 10*3/uL — ABNORMAL HIGH (ref 4.0–10.5)

## 2012-07-24 LAB — TROPONIN I: Troponin I: <0.3 ng/mL (ref <0.30)

## 2012-07-24 LAB — D-DIMER, QUANTITATIVE: D-Dimer, Quant: <0.27 ug/mL-FEU (ref 0.00–0.48)

## 2012-07-24 NOTE — ED Provider Notes (Signed)
History     CSN: [id number]  Arrival date & time 07/24/12  [id number]   First MD Initiated Contact with Patient 07/24/12 1139      Chief Complaint  Patient presents with  . Chest Pain  . Headache    (Consider location/radiation/quality/duration/timing/severity/associated sxs/prior treatment) HPI Comments: Patient presents with left-sided chest pain that has been constant since last night of sharp stabbing in the left side of his ribs and worse with movement worse with breathing. Associated with shortness of breath. He's had this pain on and off for 3-4 months. He's not had evaluated. He has a history of diabetes and hypertension and is noncompliant with his medications. He is a smoker. He also complains of a sudden onset headache this morning on the right side of his head there was maximal onset immediately. He has not had headaches like this in the past. Denies any focal weakness, numbness or tingling. Denies any fevers, chills, cough or congestion. He's never had a stress test.  The history is provided by the patient.    Past Medical History  Diagnosis Date  . Diabetes mellitus   . Hypertension     History reviewed. No pertinent past surgical history.  Family History  Problem Relation Age of Onset  . Cancer Mother   . Diabetes Father   . Hypertension Father     History  Substance Use Topics  . Smoking status: Current Every Day Smoker -- 1.00 packs/day    Types: Cigarettes  . Smokeless tobacco: Not on file  . Alcohol Use: Yes      Review of Systems  Constitutional: Positive for activity change. Negative for fever and fatigue.  HENT: Negative for congestion and rhinorrhea.   Respiratory: Positive for chest tightness and shortness of breath. Negative for cough.   Cardiovascular: Positive for chest pain.  Gastrointestinal: Negative for nausea, vomiting and abdominal pain.  Genitourinary: Negative for dysuria and hematuria.  Musculoskeletal: Positive for back pain.  Negative for myalgias and arthralgias.  Neurological: Positive for dizziness, light-headedness and headaches. Negative for weakness.  A complete 10 system review of systems was obtained and all systems are negative except as noted in the HPI and PMH.    Allergies  Bee venom and Penicillins  Home Medications   Current Outpatient Rx  Name  Route  Sig  Dispense  Refill  . ibuprofen (ADVIL,MOTRIN) 600 MG tablet   Oral   Take 1 tablet (600 mg total) by mouth every 6 (six) hours as needed for pain.   30 tablet   0     BP 158/93  Pulse 104  Temp(Src) 98.2 F (36.8 C) (Oral)  Resp 16  SpO2 100%  Physical Exam  Constitutional: He is oriented to person, place, and time. He appears well-developed and well-nourished. No distress.  HENT:  Head: Normocephalic and atraumatic.  Mouth/Throat: Oropharynx is clear and moist. No oropharyngeal exudate.  Eyes: Conjunctivae and EOM are normal. Pupils are equal, round, and reactive to light.  Neck: Normal range of motion. Neck supple.  No meningismus  Cardiovascular: Normal rate, regular rhythm and normal heart sounds.   No murmur heard. Pulmonary/Chest: Effort normal and breath sounds normal. No respiratory distress. He exhibits tenderness.  Left chest wall tender to palpation. No rash  Abdominal: Soft. There is no tenderness. There is no rebound and no guarding.  Musculoskeletal: Normal range of motion. He exhibits no edema and no tenderness.  Neurological: He is alert and oriented to person, place, and time. No  cranial nerve deficit. He exhibits normal muscle tone. Coordination normal.  5 Out of 5 strength throughout, cranial nerves 2-12 intact, no ataxia finger to nose  Skin: Skin is warm.    ED Course  Procedures (including critical care time)  Labs Reviewed  CBC - Abnormal; Notable for the following:    WBC 11.3 (*)    MCHC 29.9 (*)    All other components within normal limits  BASIC METABOLIC PANEL - Abnormal; Notable for the  following:    Glucose, Bld 273 (*)    All other components within normal limits  PROTEIN AND GLUCOSE, CSF - Abnormal; Notable for the following:    Glucose, CSF 135 (*)    Total  Protein, CSF 54 (*)    All other components within normal limits  CSF CULTURE  GRAM STAIN  PROTIME-INR  TROPONIN I  D-DIMER, QUANTITATIVE  SEDIMENTATION RATE  TROPONIN I  URINALYSIS, ROUTINE W REFLEX MICROSCOPIC  CSF CELL COUNT WITH DIFFERENTIAL  POCT I-STAT TROPONIN I   Dg Chest 2 View  07/24/2012  *RADIOLOGY REPORT*  Clinical Data: Chest pain, cough.  CHEST - 2 VIEW  Comparison: 01/11/2012  Findings: Heart and mediastinal contours are within normal limits. No focal opacities or effusions.  No acute bony abnormality.  IMPRESSION: No active cardiopulmonary disease.   Original Report Authenticated By: Rolm Baptise, M.D.    Ct Head Wo Contrast  07/24/2012  *RADIOLOGY REPORT*  Clinical Data:  42 year old male headache dizziness blurred vision and hypertension diabetes.  CT HEAD WITHOUT CONTRAST  Technique:  Contiguous axial images were obtained from the base of the skull through the vertex without contrast.  Comparison: None.  Findings: Visualized paranasal sinuses and mastoids are clear. Visualized orbits and scalp soft tissues are within normal limits. No acute osseous abnormality identified.  Cerebral volume is within normal limits for age.  No midline shift, ventriculomegaly, mass effect, evidence of mass lesion, intracranial hemorrhage or evidence of cortically based acute infarction.  Gray-white matter differentiation is within normal limits throughout the brain.  No suspicious intracranial vascular hyperdensity.  IMPRESSION: Normal noncontrast CT appearance of the brain.   Original Report Authenticated By: Roselyn Reef, M.D.    Dg Lumbar Puncture Fluoro Guide  07/24/2012  *RADIOLOGY REPORT*  Clinical Data: Sudden onset headache.  Untreated hypertension.  FLUOROSCOPIC GUIDED LUMBAR PUNCTURE  Comparison: Head CT  obtained earlier today.  Fluoroscopy time:  0 minutes and 22 seconds  Description: The procedure and risks were discussed with the patient, including the risks of bleeding, infection and CSF leak. Informed consent was obtained.  Preliminary fluoroscopic survey of the lumbar spine demonstrated five non-rib bearing lumbar vertebrae.  Using sterile technique, local anesthesia and fluoroscopic guidance, a 20 gauge spinal needle was inserted into the spinal canal at the L4-5 level.  14 ml of clear, colorless CSF was withdrawn and sent to the laboratory for testing.  An opening pressure of 21 cm of water was obtained and a closing pressure of 15 cm of water was obtained.  The patient tolerated the procedure well with no immediate complications.  IMPRESSION: Successful fluoroscopic guided lumbar puncture, as described above.   Original Report Authenticated By: Claudie Revering, M.D.      1. Chest wall pain   2. Headache   3. Hypertension       MDM  4 months of chest pain has been intermittent but constant since last night. It is reproducible and worse with movement and palpation. This is atypical for  ACS. Patient also complains of sudden onset headache that is different than his usual headaches.  EKG is nonischemic. Troponin is negative. D-dimer is negative. Chest pain is atypical for ACS or PE.  D-dimer obtained as patient stated he may have had some blood streaked sputum and was negative.  CT scan is negative for acute hemorrhage. Given patient's description of his headache, lumbar puncture was performed. Patient given list of PCPs and instructed to be compliant with meds. LP results pending at time of sign out to Dr. Wilson Singer.   Date: 07/24/2012  Rate: 102  Rhythm: sinus tachycardia  QRS Axis: normal  Intervals: normal  ST/T Wave abnormalities: normal  Conduction Disutrbances:none  Narrative Interpretation:   Old EKG Reviewed: none available    Ezequiel Essex, MD 07/24/12 1818

## 2012-07-24 NOTE — Procedures (Signed)
Fluoroscopic lumbar puncture performed at L4-5.  See radiology report.

## 2012-07-24 NOTE — ED Notes (Addendum)
Patient reports chest pain x 3-4 months.  Patient has had a headache since this morning.  Patient has a h/o DM and HTN.  Patient also reports dizziness, and diaphoresis.  Patient denies N/V.  Patient is non-compliant with HTN meds.

## 2012-07-24 NOTE — ED Notes (Signed)
Pt. Urinated right before he went to CT. Will collect urine when available. Nurse was notified

## 2012-07-24 NOTE — Progress Notes (Signed)
During [facility] ED 07/24/12 visit CM spoke with pt who confirms self pay [facility] resident with no pcp. CM discussed and provided written information for self pay pcps, importance of pcp for f/u care, www.needymeds.org, discounted pharmacies and other [place] resources such as financial assistance, DSS and  health department Reviewed Health connect number to assist with finding self pay provider close to pt's residence. Reviewed resources for [place] self pay pcps like [employer], family medicine at [address], [facility] family practice, general medical clinics, [facility], [facility], [facility] urgent care plus others, [facility] out patient pharmacies and housing Pt voiced understanding and appreciation of resources provided  Pt states he has Blue cross and blue shield coverage that will start in May 2014 (scheduled to see a provider at [employer]) but appreciates the list of self pay pcps to bridge him until that time Pt states [facility] but will check with family practice at [place].   CM answered questions about concerns after lumbar puncture and referred him to ED RN

## 2012-07-28 ENCOUNTER — Ambulatory Visit: Payer: Self-pay | Admitting: Family Medicine

## 2012-07-28 LAB — CSF CULTURE W GRAM STAIN: Culture: NO GROWTH

## 2012-07-31 ENCOUNTER — Ambulatory Visit: Payer: Self-pay | Admitting: Internal Medicine

## 2012-08-03 ENCOUNTER — Ambulatory Visit (INDEPENDENT_AMBULATORY_CARE_PROVIDER_SITE_OTHER): Payer: BC Managed Care – PPO | Admitting: Internal Medicine

## 2012-08-03 ENCOUNTER — Encounter: Payer: Self-pay | Admitting: Internal Medicine

## 2012-08-03 VITALS — BP 160/80 | HR 88 | Temp 99.3°F | Ht 65.5 in | Wt 179.0 lb

## 2012-08-03 DIAGNOSIS — I10 Essential (primary) hypertension: Secondary | ICD-10-CM | POA: Insufficient documentation

## 2012-08-03 DIAGNOSIS — E1159 Type 2 diabetes mellitus with other circulatory complications: Secondary | ICD-10-CM | POA: Insufficient documentation

## 2012-08-03 DIAGNOSIS — E1165 Type 2 diabetes mellitus with hyperglycemia: Secondary | ICD-10-CM

## 2012-08-03 DIAGNOSIS — E1142 Type 2 diabetes mellitus with diabetic polyneuropathy: Secondary | ICD-10-CM

## 2012-08-03 DIAGNOSIS — N529 Male erectile dysfunction, unspecified: Secondary | ICD-10-CM | POA: Insufficient documentation

## 2012-08-03 DIAGNOSIS — E1149 Type 2 diabetes mellitus with other diabetic neurological complication: Secondary | ICD-10-CM

## 2012-08-03 DIAGNOSIS — F17200 Nicotine dependence, unspecified, uncomplicated: Secondary | ICD-10-CM

## 2012-08-03 DIAGNOSIS — G43909 Migraine, unspecified, not intractable, without status migrainosus: Secondary | ICD-10-CM

## 2012-08-03 MED ORDER — METFORMIN HCL 500 MG PO TABS: 500.0000 mg | ORAL_TABLET | Freq: Two times a day (BID) | ORAL | Status: DC

## 2012-08-03 MED ORDER — ATORVASTATIN CALCIUM 20 MG PO TABS: 20.0000 mg | ORAL_TABLET | Freq: Every day | ORAL | Status: DC

## 2012-08-03 MED ORDER — AMLODIPINE BESYLATE 5 MG PO TABS: 5.0000 mg | ORAL_TABLET | Freq: Every day | ORAL | Status: DC

## 2012-08-03 MED ORDER — LISINOPRIL 10 MG PO TABS: 10.0000 mg | ORAL_TABLET | Freq: Every day | ORAL | Status: DC

## 2012-08-03 NOTE — Assessment & Plan Note (Signed)
Patient has history of type 2 diabetes that is poorly controlled. He has evidence of diabetic polyneuropathy. Continue metformin 500 mg twice daily. Check A1c and microalb/creatinine ratio. Refer to diabetic educator for nutrition counseling. He may need insulin therapy.  New glucometer with instruction provided.  Reviewed basics of carb modified diet.

## 2012-08-03 NOTE — Assessment & Plan Note (Signed)
I strongly encouraged tobacco cessation.  Plan to discuss use of Chantix at next OV.

## 2012-08-03 NOTE — Assessment & Plan Note (Signed)
Patient with poorly controlled hypertension. Continue lisinopril 10 mg. Add amlodipine 5 mg.

## 2012-08-03 NOTE — Assessment & Plan Note (Signed)
ED likely secondary to history of diabetes, hypertension and tobacco use.  Initiate further workup for erectile dysfunction once blood sugar and hypertension better controlled.

## 2012-08-03 NOTE — Progress Notes (Signed)
Subjective:    Patient ID: [name], male    DOB: 17-Sep-1970, 42 y.o.   MRN: [id number]  HPI  42 year old [place] American male with history of type 2 diabetes hypertension to establish. It has been several years since he was evaluated by primary care physician. [facility]. Patient has been diabetic for at least 10 years. He reports blood sugars are poorly controlled. He does not monitor his blood sugars. He does not follow diabetic diet. He reports increased thirst but no polyuria.  He is not recall his last eye exam. He reports burning sensation in the bottoms of both feet.  Patient also hypertensive. He currently takes lisinopril 10 mg once daily. He has frequent headaches. He was in the ER a month ago with severe migraine. Patient reports he had lumbar puncture which was unremarkable.  Review of Systems   Constitutional: Negative for activity change, and unexpected weight change. poor appetitie Eyes: Negative for visual disturbance.  Respiratory: Negative for cough, chest tightness and shortness of breath.   Cardiovascular: Negative for chest pain.  Genitourinary: Negative for difficulty urinating.  Neurological: positive for headaches.  Gastrointestinal: Negative for abdominal pain, heartburn melena or hematochezia Psych: Negative for depression or anxiety Endo:  Erectile dysfunction        Past Medical History  Diagnosis Date  . Diabetes mellitus   . Hypertension   . Tobacco use     History   Social History  . Marital Status: Single    Spouse Name: N/A    Number of Children: N/A  . Years of Education: N/A   Occupational History  . [employer]   Social History Main Topics  . Smoking status: Current Every Day Smoker -- 1.00 packs/day for 20 years    Types: Cigarettes  . Smokeless tobacco: Not on file  . Alcohol Use: Yes  . Drug Use: No  . Sexually Active: Not Currently   Other Topics Concern  . Not on file   Social History  Narrative  . No narrative on file    No past surgical history on file.  Family History  Problem Relation Age of Onset  . Cancer Mother   . Diabetes Father   . Hypertension Father     Allergies  Allergen Reactions  . Bee Venom Swelling  . Penicillins Hives    No current outpatient prescriptions on file prior to visit.   No current facility-administered medications on file prior to visit.    BP 160/80  Pulse 88  Temp(Src) 99.3 F (37.4 C) (Oral)  Ht 5' 5.5" (1.664 m)  Wt 179 lb (81.194 kg)  BMI 29.32 kg/m2    Objective:   Physical Exam  Constitutional: He is oriented to person, place, and time. He appears well-developed and well-nourished.  HENT:  Head: Normocephalic and atraumatic.  Right Ear: External ear normal.  Left Ear: External ear normal.  Mouth/Throat: Oropharynx is clear and moist.  Eyes: Conjunctivae and EOM are normal. Pupils are equal, round, and reactive to light.  Neck: Neck supple.  No carotid bruit  Cardiovascular: Normal rate, regular rhythm and normal heart sounds.   Pulmonary/Chest: Effort normal and breath sounds normal. He has no wheezes.  Abdominal: Soft. Bowel sounds are normal. He exhibits no mass. There is no tenderness.  Genitourinary: Penis normal.  Uncircumcised, normal testicular exam  Musculoskeletal: He exhibits no edema.  Neurological: He is alert and oriented to person, place, and time. No cranial nerve deficit.  Skin: Skin is warm and dry.  Diabetic foot exam-no cracks or fissures, normal distal pulses, decreased sensation to temperature and vibration  Psychiatric: He has a normal mood and affect. His behavior is normal.          Assessment & Plan:

## 2012-08-04 ENCOUNTER — Other Ambulatory Visit (INDEPENDENT_AMBULATORY_CARE_PROVIDER_SITE_OTHER): Payer: BC Managed Care – PPO

## 2012-08-04 DIAGNOSIS — E1142 Type 2 diabetes mellitus with diabetic polyneuropathy: Secondary | ICD-10-CM

## 2012-08-04 DIAGNOSIS — I10 Essential (primary) hypertension: Secondary | ICD-10-CM

## 2012-08-04 DIAGNOSIS — E1149 Type 2 diabetes mellitus with other diabetic neurological complication: Secondary | ICD-10-CM

## 2012-08-04 DIAGNOSIS — E1165 Type 2 diabetes mellitus with hyperglycemia: Secondary | ICD-10-CM

## 2012-08-04 LAB — CBC WITH DIFFERENTIAL/PLATELET
Basophils Relative: 0.4 % (ref 0.0–3.0)
Eosinophils Relative: 1.7 % (ref 0.0–5.0)
HCT: 46.1 % (ref 39.0–52.0)
Lymphs Abs: 3.6 10*3/uL (ref 0.7–4.0)
MCV: 85.8 fl (ref 78.0–100.0)
Monocytes Absolute: 0.6 10*3/uL (ref 0.1–1.0)
Monocytes Relative: 4.9 % (ref 3.0–12.0)
Platelets: 221 10*3/uL (ref 150.0–400.0)
RBC: 5.37 Mil/uL (ref 4.22–5.81)
WBC: 11.4 10*3/uL — ABNORMAL HIGH (ref 4.5–10.5)

## 2012-08-04 LAB — BASIC METABOLIC PANEL
BUN: 13 mg/dL (ref 6–23)
Chloride: 103 mEq/L (ref 96–112)
GFR: 131.98 mL/min (ref >60.00)
Potassium: 4.5 mEq/L (ref 3.5–5.1)
Sodium: 137 mEq/L (ref 135–145)

## 2012-08-04 LAB — LIPID PANEL
LDL Cholesterol: 46 mg/dL (ref 0–99)
VLDL: 14.2 mg/dL (ref 0.0–40.0)

## 2012-08-04 LAB — HEPATIC FUNCTION PANEL
ALT: 13 U/L (ref 0–53)
AST: 8 U/L (ref 0–37)
Albumin: 3.6 g/dL (ref 3.5–5.2)
Alkaline Phosphatase: 75 U/L (ref 39–117)
Total Bilirubin: 0.3 mg/dL (ref 0.3–1.2)

## 2012-08-04 LAB — MICROALBUMIN / CREATININE URINE RATIO
Creatinine,U: 152.9 mg/dL
Microalb, Ur: 5.7 mg/dL — ABNORMAL HIGH (ref 0.0–1.9)

## 2012-08-04 LAB — VITAMIN B12: Vitamin B-12: 231 pg/mL (ref 211–911)

## 2012-08-04 LAB — HEMOGLOBIN A1C: Hgb A1c MFr Bld: 10.6 % — ABNORMAL HIGH (ref 4.6–6.5)

## 2012-08-11 ENCOUNTER — Encounter: Payer: Self-pay | Admitting: Internal Medicine

## 2012-08-11 ENCOUNTER — Ambulatory Visit (INDEPENDENT_AMBULATORY_CARE_PROVIDER_SITE_OTHER): Payer: BC Managed Care – PPO | Admitting: Internal Medicine

## 2012-08-11 VITALS — BP 162/102 | HR 102 | Temp 98.4°F | Wt 174.0 lb

## 2012-08-11 DIAGNOSIS — E1149 Type 2 diabetes mellitus with other diabetic neurological complication: Secondary | ICD-10-CM

## 2012-08-11 DIAGNOSIS — E1142 Type 2 diabetes mellitus with diabetic polyneuropathy: Secondary | ICD-10-CM

## 2012-08-11 DIAGNOSIS — N529 Male erectile dysfunction, unspecified: Secondary | ICD-10-CM

## 2012-08-11 DIAGNOSIS — I10 Essential (primary) hypertension: Secondary | ICD-10-CM

## 2012-08-11 MED ORDER — SILDENAFIL CITRATE 50 MG PO TABS: 50.0000 mg | ORAL_TABLET | ORAL | Status: DC | PRN

## 2012-08-11 MED ORDER — INSULIN DETEMIR 100 UNIT/ML FLEXPEN: 15.0000 [IU] | PEN_INJECTOR | Freq: Every day | SUBCUTANEOUS | Status: DC

## 2012-08-11 MED ORDER — INSULIN PEN NEEDLE 31G X 8 MM MISC: 1.0000 | Freq: Every day | Status: DC

## 2012-08-11 NOTE — Progress Notes (Signed)
  Subjective:    Patient ID: [name], male    DOB: 11-29-1970, 41 y.o.   MRN: [id number]  HPI  42 year old African American male for followup regarding uncontrolled type 2 diabetes hypertension.  Patient's A1c 10.6. He has noticed mild GI upset with metformin. His fasting blood sugars are 200-250. He is planning to diabetic educator and ophthalmologist.   Review of Systems Negative for chest pain.  He has reduced his tobacco use.  Marland Kitchen Past Medical History  Diagnosis Date  . Diabetes mellitus   . Hypertension   . Tobacco use     History   Social History  . Marital Status: Single    Spouse Name: N/A    Number of Children: N/A  . Years of Education: N/A   Occupational History  . [employer]   Social History Main Topics  . Smoking status: Current Every Day Smoker -- 1.00 packs/day for 20 years    Types: Cigarettes  . Smokeless tobacco: Not on file  . Alcohol Use: Yes  . Drug Use: No  . Sexually Active: Not Currently   Other Topics Concern  . Not on file   Social History Narrative  . No narrative on file    No past surgical history on file.  Family History  Problem Relation Age of Onset  . Cancer Mother   . Diabetes Father   . Hypertension Father     Allergies  Allergen Reactions  . Bee Venom Swelling  . Penicillins Hives    Current Outpatient Prescriptions on File Prior to Visit  Medication Sig Dispense Refill  . amLODipine (NORVASC) 5 MG tablet Take 1 tablet (5 mg total) by mouth daily.  30 tablet  1  . atorvastatin (LIPITOR) 20 MG tablet Take 1 tablet (20 mg total) by mouth daily.  30 tablet  1  . lisinopril (PRINIVIL,ZESTRIL) 10 MG tablet Take 1 tablet (10 mg total) by mouth daily.  30 tablet  1  . metFORMIN (GLUCOPHAGE) 500 MG tablet Take 1 tablet (500 mg total) by mouth 2 (two) times daily with a meal.  60 tablet  1   No current facility-administered medications on file prior to visit.    BP 162/102  Pulse 102  Temp(Src) 98.4 F (36.9 C)  (Oral)  Wt 174 lb (78.926 kg)  BMI 28.5 kg/m2        Objective:   Physical Exam  Constitutional: He is oriented to person, place, and time. He appears well-developed and well-nourished.  Cardiovascular: Normal rate, regular rhythm and normal heart sounds.   Pulmonary/Chest: Effort normal and breath sounds normal. He has no wheezes.  Neurological: He is alert and oriented to person, place, and time.  Psychiatric: He has a normal mood and affect. His behavior is normal.          Assessment & Plan:

## 2012-08-11 NOTE — Assessment & Plan Note (Addendum)
Blood sugars improving. Start Levemir 15 units once daily. Titrate every other day by 2 units until fasting morning blood sugar 150 or less.  Insulin teaching provided.  Lab Results  Component Value Date   HGBA1C 10.6* 08/04/2012

## 2012-08-11 NOTE — Assessment & Plan Note (Signed)
Patient recently started amlodipine.  Wait 2-4 weeks until further medication changes.

## 2012-08-11 NOTE — Assessment & Plan Note (Signed)
Trial of viagra 50 mg.  Samples provided.  Check testosterone levels at next OV.

## 2012-08-11 NOTE — Patient Instructions (Signed)
Increase levemir dose by 2 units every other day until your morning blood sugar is less than 150.

## 2012-09-05 ENCOUNTER — Encounter: Payer: BC Managed Care – PPO | Attending: Internal Medicine | Admitting: *Deleted

## 2012-09-05 ENCOUNTER — Encounter: Payer: Self-pay | Admitting: *Deleted

## 2012-09-05 VITALS — Ht 65.5 in | Wt 181.6 lb

## 2012-09-05 DIAGNOSIS — E119 Type 2 diabetes mellitus without complications: Secondary | ICD-10-CM | POA: Insufficient documentation

## 2012-09-05 DIAGNOSIS — E1165 Type 2 diabetes mellitus with hyperglycemia: Secondary | ICD-10-CM

## 2012-09-05 DIAGNOSIS — Z713 Dietary counseling and surveillance: Secondary | ICD-10-CM | POA: Insufficient documentation

## 2012-09-05 DIAGNOSIS — E1149 Type 2 diabetes mellitus with other diabetic neurological complication: Secondary | ICD-10-CM

## 2012-09-05 NOTE — Progress Notes (Signed)
  Medical Nutrition Therapy:  Appt start time: [id number] end time:  [id number].  Assessment:  Primary concerns today: patient here for initial diabetes education. States history of Diabetes for about 10 years with no diabetes education before today.  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 Does own shopping and food preparation at home. Eats out about twice a week, usually fast food. SMBG twice a day currently, before meals. Reported range is between 200 to 250. States his BG before starting on Levemir was in the 300-500 range. Not active now due to foot pain - neuropathy.  MEDICATIONS: see list. Diabetes medication is Metformin and Levemir   DIETARY INTAKE:  Usual eating pattern includes 1-2 meals and 1 snacks per day.  Everyday foods include starch and meats.  Avoided foods include vegetables, .    24-hr recall:  B ( AM): skips unless he has bacon and egg sandwich, water Snk ( AM): none  L ( PM): skips often, not hungry Snk ( PM): none D ( PM): meat and starch, doesn't like vegetables but would like to start trying some, water Snk ( PM): chips occasionally Beverages: water  Usual physical activity: none now  Estimated energy needs: 1600 calories 180 g carbohydrates 120 g protein 44 g fat  Progress Towards Goal(s):  In progress.   Nutritional Diagnosis:  NB-1.1 Food and nutrition-related knowledge deficit As related to diabetes management.  As evidenced by A1c of 10.6% on 08/04/2012.    Intervention:  Nutrition counseling and diabetes education initiated. Discussed basic physiology of diabetes, Foot Care, Complications, SMBG and rationale of checking BG at alternate times of day, A1c, Carb Counting and reading food labels. Plan to discuss benefits of increased activity and reinforce carb counting at next visit.   Plan:  Aim for 3 Carb Choices per meal (45 grams) +/- 1 either way  Aim for 0-2 Carbs per snack if hungry  Consider reading food labels for Total  Carbohydrate of foods Continue checking BG daily as directed by MD  Continue taking Levemir and Metformin medication as directed by MD  Use moisturizing cream on your feet regularly, note foot care guide in Living Well With Diabetes Book  Handouts given during visit include: Living Well with Diabetes Carb Counting and Food Label handouts Meal Plan Card  Insulin administration sites  Monitoring/Evaluation:  Dietary intake, exercise, reading food labels, and body weight in 4 week(s).

## 2012-09-05 NOTE — Patient Instructions (Signed)
Plan:  Aim for 3 Carb Choices per meal (45 grams) +/- 1 either way  Aim for 0-2 Carbs per snack if hungry  Consider reading food labels for Total Carbohydrate of foods Continue checking BG daily as directed by MD  Continue taking Levemir and Metformin medication as directed by MD

## 2012-10-02 ENCOUNTER — Encounter (INDEPENDENT_AMBULATORY_CARE_PROVIDER_SITE_OTHER): Payer: Self-pay | Admitting: Ophthalmology

## 2012-10-03 ENCOUNTER — Ambulatory Visit: Payer: BC Managed Care – PPO | Admitting: Family Medicine

## 2012-10-09 ENCOUNTER — Ambulatory Visit: Payer: BC Managed Care – PPO | Admitting: Family Medicine

## 2012-10-16 ENCOUNTER — Ambulatory Visit: Payer: BC Managed Care – PPO | Admitting: Family Medicine

## 2012-10-16 ENCOUNTER — Encounter (INDEPENDENT_AMBULATORY_CARE_PROVIDER_SITE_OTHER): Payer: Self-pay | Admitting: Ophthalmology

## 2012-11-28 ENCOUNTER — Ambulatory Visit (INDEPENDENT_AMBULATORY_CARE_PROVIDER_SITE_OTHER): Payer: BC Managed Care – PPO | Admitting: Internal Medicine

## 2012-11-28 ENCOUNTER — Encounter: Payer: Self-pay | Admitting: Internal Medicine

## 2012-11-28 VITALS — BP 172/98 | HR 88 | Temp 99.7°F | Ht 65.5 in | Wt 179.0 lb

## 2012-11-28 DIAGNOSIS — E1142 Type 2 diabetes mellitus with diabetic polyneuropathy: Secondary | ICD-10-CM

## 2012-11-28 DIAGNOSIS — R197 Diarrhea, unspecified: Secondary | ICD-10-CM

## 2012-11-28 DIAGNOSIS — E1149 Type 2 diabetes mellitus with other diabetic neurological complication: Secondary | ICD-10-CM

## 2012-11-28 DIAGNOSIS — R112 Nausea with vomiting, unspecified: Secondary | ICD-10-CM | POA: Insufficient documentation

## 2012-11-28 DIAGNOSIS — R1032 Left lower quadrant pain: Secondary | ICD-10-CM

## 2012-11-28 LAB — BASIC METABOLIC PANEL
CO2: 30 mEq/L (ref 19–32)
Calcium: 9.3 mg/dL (ref 8.4–10.5)
Creatinine, Ser: 0.9 mg/dL (ref 0.4–1.5)
GFR: 123.08 mL/min (ref >60.00)
Glucose, Bld: 192 mg/dL — ABNORMAL HIGH (ref 70–99)

## 2012-11-28 LAB — CBC WITH DIFFERENTIAL/PLATELET
Basophils Absolute: 0 10*3/uL (ref 0.0–0.1)
Eosinophils Absolute: 0.1 10*3/uL (ref 0.0–0.7)
Lymphocytes Relative: 33.9 % (ref 12.0–46.0)
MCHC: 32.8 g/dL (ref 30.0–36.0)
Neutro Abs: 6.1 10*3/uL (ref 1.4–7.7)
Neutrophils Relative %: 60.5 % (ref 43.0–77.0)
Platelets: 246 10*3/uL (ref 150.0–400.0)
RDW: 16.6 % — ABNORMAL HIGH (ref 11.5–14.6)

## 2012-11-28 LAB — HEMOGLOBIN A1C: Hgb A1c MFr Bld: 7 % — ABNORMAL HIGH (ref 4.6–6.5)

## 2012-11-28 LAB — HEPATIC FUNCTION PANEL
Bilirubin, Direct: 0 mg/dL (ref 0.0–0.3)
Total Bilirubin: 0.5 mg/dL (ref 0.3–1.2)
Total Protein: 7.7 g/dL (ref 6.0–8.3)

## 2012-11-28 LAB — LIPASE: Lipase: 18 U/L (ref 11.0–59.0)

## 2012-11-28 MED ORDER — ACYCLOVIR 5 % EX OINT: TOPICAL_OINTMENT | CUTANEOUS | Status: DC

## 2012-11-28 MED ORDER — METFORMIN HCL 500 MG PO TABS: 500.0000 mg | ORAL_TABLET | Freq: Two times a day (BID) | ORAL | Status: DC

## 2012-11-28 MED ORDER — ONDANSETRON HCL 4 MG PO TABS: 4.0000 mg | ORAL_TABLET | Freq: Three times a day (TID) | ORAL | Status: DC | PRN

## 2012-11-28 MED ORDER — INSULIN DETEMIR 100 UNIT/ML FLEXPEN: 15.0000 [IU] | PEN_INJECTOR | Freq: Every day | SUBCUTANEOUS | Status: DC

## 2012-11-28 MED ORDER — ATORVASTATIN CALCIUM 20 MG PO TABS: 20.0000 mg | ORAL_TABLET | Freq: Every day | ORAL | Status: DC

## 2012-11-28 MED ORDER — AMLODIPINE BESYLATE 5 MG PO TABS: 5.0000 mg | ORAL_TABLET | Freq: Every day | ORAL | Status: DC

## 2012-11-28 MED ORDER — LISINOPRIL 10 MG PO TABS: 10.0000 mg | ORAL_TABLET | Freq: Every day | ORAL | Status: DC

## 2012-11-28 MED ORDER — CIPROFLOXACIN HCL 500 MG PO TABS: 500.0000 mg | ORAL_TABLET | Freq: Two times a day (BID) | ORAL | Status: DC

## 2012-11-28 MED ORDER — METRONIDAZOLE 500 MG PO TABS: 500.0000 mg | ORAL_TABLET | Freq: Three times a day (TID) | ORAL | Status: DC

## 2012-11-28 NOTE — Patient Instructions (Signed)
Please hold metformin, lisinopril and lipitor until your nausea, vomiting and diarrhea has resolved. Increase fluid intake Follow BRAT diet as directed Please contact our office if your symptoms do not improve or gets worse.

## 2012-11-28 NOTE — Progress Notes (Signed)
  Subjective:    Patient ID: [name], male    DOB: [birth year], 42 y.o.   MRN: [id number]  HPI  42 year old African American male for followup regarding uncontrolled type 2 diabetes and hypertension.  Patient reports he ran out of insulin one month ago. His blood sugars are greater than 200. He also ran out of his blood pressure and cholesterol medication.  Patient complains of nausea,vomiting, abdominal cramps and diarrhea. His symptoms started 3-4 days ago. He denies any unusual food intake. No sick contacts. He vomited twice today and experienced 2 loose stools. Patient able to keep down water. He reports low-grade fever at home. He has left lower quadrant pain that is rated 6/10.  He denies any use of recent antibiotics   Review of Systems Low grade fever, Negative for hematochezia  Past Medical History  Diagnosis Date  . Diabetes mellitus   . Hypertension   . Tobacco use     History   Social History  . Marital Status: Single    Spouse Name: N/A    Number of Children: N/A  . Years of Education: N/A   Occupational History  . [employer]   Social History Main Topics  . Smoking status: Current Every Day Smoker -- 1.00 packs/day for 20 years    Types: Cigarettes  . Smokeless tobacco: Never Used  . Alcohol Use: No  . Drug Use: No  . Sexual Activity: Not Currently   Other Topics Concern  . Not on file   Social History Narrative  . No narrative on file    No past surgical history on file.  Family History  Problem Relation Age of Onset  . Cancer Mother   . Diabetes Father   . Hypertension Father     Allergies  Allergen Reactions  . Bee Venom Swelling  . Penicillins Hives    Current Outpatient Prescriptions on File Prior to Visit  Medication Sig Dispense Refill  . Insulin Pen Needle (B-D ULTRAFINE III SHORT PEN) 31G X 8 MM MISC 1 each by Does not apply route daily.  100 each  3  . sildenafil (VIAGRA) 50 MG tablet Take 1 tablet (50 mg total) by mouth  as needed for erectile dysfunction.  4 tablet  0   No current facility-administered medications on file prior to visit.    BP 172/98  Pulse 88  Temp(Src) 99.7 F (37.6 C) (Oral)  Ht 5' 5.5" (1.664 m)  Wt 179 lb (81.194 kg)  BMI 29.32 kg/m2       Objective:   Physical Exam  Constitutional: He is oriented to person, place, and time. He appears well-developed and well-nourished.  HENT:  Head: Normocephalic and atraumatic.  Right Ear: External ear normal.  Left Ear: External ear normal.  Mouth/Throat: Oropharynx is clear and moist.  Neck: Neck supple.  Cardiovascular: Normal rate, regular rhythm and normal heart sounds.   Pulmonary/Chest: Effort normal and breath sounds normal. He has no wheezes.  Abdominal: Soft. Bowel sounds are normal. He exhibits no mass. There is no rebound and no guarding.  Mild diffuse abdominal tenderness.  LLQ more tender  Lymphadenopathy:    He has no cervical adenopathy.  Neurological: He is alert and oriented to person, place, and time. No cranial nerve deficit.  Skin: Skin is warm and dry.  Psychiatric: He has a normal mood and affect. His behavior is normal.          Assessment & Plan:

## 2012-11-28 NOTE — Assessment & Plan Note (Signed)
Blood sugars improved while he was taking metformin and Levemir. Patient advised to hold metformin, lisinopril and Lipitor until gastrointestinal illness resolved. Monitor A1c.  He would like to transfer care to Dr. Alain Marion due to transportation issues.

## 2012-11-28 NOTE — Assessment & Plan Note (Signed)
Patient with 4 days of nausea, vomiting, and diarrhea. I suspect viral gastroenteritis.  Patient's abdominal pain mainly localized to left lower quadrant. Consider diverticulitis. Empiric treatment with ciprofloxacin and at night as are considering low-grade fever.  Check CBCD, BMET and Lipase.  Increase fluid intake. Use Zofran 4 mg every 8 hours as needed for nausea.  Patient advised to call office if symptoms persist or worsen.

## 2012-11-29 ENCOUNTER — Telehealth: Payer: Self-pay | Admitting: Internal Medicine

## 2012-11-29 NOTE — Progress Notes (Signed)
Quick Note:  Left a message for return call. ______ 

## 2012-11-29 NOTE — Telephone Encounter (Signed)
See result note.  

## 2012-11-29 NOTE — Telephone Encounter (Signed)
Pt inquiring about lab results from 11/28/12. Please assist.

## 2012-12-01 ENCOUNTER — Encounter: Payer: Self-pay | Admitting: Internal Medicine

## 2012-12-01 ENCOUNTER — Ambulatory Visit (INDEPENDENT_AMBULATORY_CARE_PROVIDER_SITE_OTHER): Payer: Self-pay | Admitting: Internal Medicine

## 2012-12-01 VITALS — BP 142/90 | HR 84 | Temp 99.1°F | Wt 180.0 lb

## 2012-12-01 DIAGNOSIS — Z23 Encounter for immunization: Secondary | ICD-10-CM

## 2012-12-01 DIAGNOSIS — E1140 Type 2 diabetes mellitus with diabetic neuropathy, unspecified: Secondary | ICD-10-CM | POA: Insufficient documentation

## 2012-12-01 DIAGNOSIS — E1149 Type 2 diabetes mellitus with other diabetic neurological complication: Secondary | ICD-10-CM

## 2012-12-01 DIAGNOSIS — F17200 Nicotine dependence, unspecified, uncomplicated: Secondary | ICD-10-CM

## 2012-12-01 DIAGNOSIS — N529 Male erectile dysfunction, unspecified: Secondary | ICD-10-CM

## 2012-12-01 DIAGNOSIS — F430 Acute stress reaction: Secondary | ICD-10-CM | POA: Insufficient documentation

## 2012-12-01 DIAGNOSIS — E1142 Type 2 diabetes mellitus with diabetic polyneuropathy: Secondary | ICD-10-CM

## 2012-12-01 DIAGNOSIS — I10 Essential (primary) hypertension: Secondary | ICD-10-CM

## 2012-12-01 MED ORDER — VARENICLINE TARTRATE 1 MG PO TABS: 1.0000 mg | ORAL_TABLET | Freq: Two times a day (BID) | ORAL | Status: DC

## 2012-12-01 MED ORDER — TADALAFIL 5 MG PO TABS: 5.0000 mg | ORAL_TABLET | Freq: Every day | ORAL | Status: DC | PRN

## 2012-12-01 MED ORDER — VARENICLINE TARTRATE 0.5 MG X 11 & 1 MG X 42 PO MISC: ORAL | Status: DC

## 2012-12-01 NOTE — Progress Notes (Signed)
Subjective:    Patient ID: [name], male    DOB: Apr 13, 1970, 42 y.o.   MRN: [id number]  HPI  42 year old African American male for followup regarding nausea, vomiting And diarrhea. Patient reports his symptoms completely resolved with finishing course of Cipro, Flagyl and Zofran. He is eating and drinking normally.  Patient complains of stress reaction. It stems from marital issues with his wife. Wife has left him in the past 5-6 times.  Type 2 diabetes-blood sugars are improving. He denies any hypoglycemic episodes.  Hypertension - he recently restarted his medications.  Tobacco use - he is interested in smoking cessation.  Review of Systems Negative for chest pain.  Mild numbness in his feet  Past Medical History  Diagnosis Date  . Diabetes mellitus   . Hypertension   . Tobacco use     History   Social History  . Marital Status: Single    Spouse Name: N/A    Number of Children: N/A  . Years of Education: N/A   Occupational History  . [employer]   Social History Main Topics  . Smoking status: Current Every Day Smoker -- 1.00 packs/day for 20 years    Types: Cigarettes  . Smokeless tobacco: Never Used  . Alcohol Use: No  . Drug Use: No  . Sexual Activity: Not Currently   Other Topics Concern  . Not on file   Social History Narrative  . No narrative on file    No past surgical history on file.  Family History  Problem Relation Age of Onset  . Cancer Mother   . Diabetes Father   . Hypertension Father     Allergies  Allergen Reactions  . Bee Venom Swelling  . Penicillins Hives    Current Outpatient Prescriptions on File Prior to Visit  Medication Sig Dispense Refill  . acyclovir ointment (ZOVIRAX) 5 % Apply topically every 3 (three) hours. Apply 6 times daily for 7 days  15 g  0  . amLODipine (NORVASC) 5 MG tablet Take 1 tablet (5 mg total) by mouth daily.  90 tablet  1  . atorvastatin (LIPITOR) 20 MG tablet Take 1 tablet (20 mg total) by  mouth daily.  90 tablet  1  . Insulin Detemir (LEVEMIR FLEXPEN) 100 UNIT/ML SOPN Inject 15 Units into the skin at bedtime.  9 mL  3  . Insulin Pen Needle (B-D ULTRAFINE III SHORT PEN) 31G X 8 MM MISC 1 each by Does not apply route daily.  100 each  3  . lisinopril (PRINIVIL,ZESTRIL) 10 MG tablet Take 1 tablet (10 mg total) by mouth daily.  90 tablet  1  . metFORMIN (GLUCOPHAGE) 500 MG tablet Take 1 tablet (500 mg total) by mouth 2 (two) times daily with a meal.  180 tablet  1   No current facility-administered medications on file prior to visit.    BP 142/90  Pulse 84  Temp(Src) 99.1 F (37.3 C) (Oral)  Wt 180 lb (81.647 kg)  BMI 29.49 kg/m2       Objective:   Physical Exam  Constitutional: He is oriented to person, place, and time. He appears well-developed and well-nourished.  Cardiovascular: Normal rate, regular rhythm and normal heart sounds.   No murmur heard. Pulmonary/Chest: Effort normal and breath sounds normal. He has no wheezes.  Musculoskeletal:  Trace lower extremity edema bilaterally  Neurological: He is alert and oriented to person, place, and time.  Skin:  See diabetic foot exam details  Psychiatric:  He has a normal mood and affect. His behavior is normal.          Assessment & Plan:    1. Viral gastroenteritis-resolved

## 2012-12-01 NOTE — Assessment & Plan Note (Signed)
Blood sugar control improving.  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 He he reports completing diabetic eye exam. He was referred to Dr. Helaine Chess specialist. He has yet to follow through with appointment.  Patient encouraged to keep appt with Dr. Zigmund Daniel. Lab Results  Component Value Date   HGBA1C 7.0* 11/28/2012    Patient updated with influenza vaccine.

## 2012-12-01 NOTE — Assessment & Plan Note (Signed)
Patient restarted BP medications.  No changes for now.  BP: 142/90 mmHg

## 2012-12-01 NOTE — Assessment & Plan Note (Signed)
Start Chantix.  Patient motivated to quit smoking.

## 2012-12-01 NOTE — Assessment & Plan Note (Signed)
Patient has mild diabetic peripheral neuropathy. He wears shoes with wide toe box. He understands the importance of inspecting his feet daily.  Hopefully his mild neuropathy will improve with better control of his diabetes.

## 2012-12-01 NOTE — Assessment & Plan Note (Signed)
Patient experiencing stress from marital issues. Refer to Richardo Priest for counseling.

## 2012-12-01 NOTE — Assessment & Plan Note (Signed)
Trial of Cialis 5 mg.  Samples provided.

## 2013-02-01 ENCOUNTER — Ambulatory Visit: Payer: BC Managed Care – PPO | Admitting: Internal Medicine

## 2013-02-02 ENCOUNTER — Encounter: Payer: Self-pay | Admitting: Internal Medicine

## 2013-02-02 ENCOUNTER — Ambulatory Visit (INDEPENDENT_AMBULATORY_CARE_PROVIDER_SITE_OTHER): Payer: Self-pay | Admitting: Internal Medicine

## 2013-02-02 VITALS — BP 144/92 | HR 88 | Temp 98.4°F | Ht 65.5 in | Wt 184.0 lb

## 2013-02-02 DIAGNOSIS — Z23 Encounter for immunization: Secondary | ICD-10-CM

## 2013-02-02 DIAGNOSIS — I10 Essential (primary) hypertension: Secondary | ICD-10-CM

## 2013-02-02 DIAGNOSIS — E1142 Type 2 diabetes mellitus with diabetic polyneuropathy: Secondary | ICD-10-CM

## 2013-02-02 DIAGNOSIS — E11319 Type 2 diabetes mellitus with unspecified diabetic retinopathy without macular edema: Secondary | ICD-10-CM | POA: Insufficient documentation

## 2013-02-02 DIAGNOSIS — F17200 Nicotine dependence, unspecified, uncomplicated: Secondary | ICD-10-CM

## 2013-02-02 DIAGNOSIS — E1139 Type 2 diabetes mellitus with other diabetic ophthalmic complication: Secondary | ICD-10-CM

## 2013-02-02 DIAGNOSIS — E1149 Type 2 diabetes mellitus with other diabetic neurological complication: Secondary | ICD-10-CM

## 2013-02-02 MED ORDER — INSULIN LISPRO 100 UNIT/ML (KWIKPEN): PEN_INJECTOR | SUBCUTANEOUS | Status: DC

## 2013-02-02 MED ORDER — INSULIN DETEMIR 100 UNIT/ML FLEXPEN: 30.0000 [IU] | PEN_INJECTOR | Freq: Every day | SUBCUTANEOUS | Status: DC

## 2013-02-02 MED ORDER — INSULIN PEN NEEDLE 31G X 8 MM MISC: Status: DC

## 2013-02-02 NOTE — Progress Notes (Signed)
Subjective:    Patient ID: [name], male    DOB: Feb 11, 1971, 42 y.o.   MRN: [id number]  HPI  42 year old African American male with history of uncontrolled type 2 diabetes, hypertension and tobacco use for followup. Patient reports his blood sugars have been much higher in the morning since previous visit. His fasting blood sugars are between 200 -300. He admits to poor dietary compliance. He is eating generous portion of carbs with the evening meal.  He has been urinating more frequently.  He does report using his insulin and metformin a regular basis.  Patient was scheduled to see a retinal specialist. He never followed through due to financial reasons.  Tobacco use-patient never filled Chantix. Smoking habit is unchanged.  This largest carbohydrate meals in the evening. (He eats rice or pasta)  Review of Systems Negative for chest pain, polyuria    Past Medical History  Diagnosis Date  . Diabetes mellitus   . Hypertension   . Tobacco use     History   Social History  . Marital Status: Single    Spouse Name: N/A    Number of Children: N/A  . Years of Education: N/A   Occupational History  . [employer]   Social History Main Topics  . Smoking status: Current Every Day Smoker -- 1.00 packs/day for 20 years    Types: Cigarettes  . Smokeless tobacco: Never Used  . Alcohol Use: No  . Drug Use: No  . Sexual Activity: Not Currently   Other Topics Concern  . Not on file   Social History Narrative  . No narrative on file    No past surgical history on file.  Family History  Problem Relation Age of Onset  . Cancer Mother   . Diabetes Father   . Hypertension Father     Allergies  Allergen Reactions  . Bee Venom Swelling  . Penicillins Hives    Current Outpatient Prescriptions on File Prior to Visit  Medication Sig Dispense Refill  . amLODipine (NORVASC) 5 MG tablet Take 1 tablet (5 mg total) by mouth daily.  90 tablet  1  . atorvastatin (LIPITOR) 20  MG tablet Take 1 tablet (20 mg total) by mouth daily.  90 tablet  1  . lisinopril (PRINIVIL,ZESTRIL) 10 MG tablet Take 1 tablet (10 mg total) by mouth daily.  90 tablet  1  . metFORMIN (GLUCOPHAGE) 500 MG tablet Take 1 tablet (500 mg total) by mouth 2 (two) times daily with a meal.  180 tablet  1  . tadalafil (CIALIS) 5 MG tablet Take 1 tablet (5 mg total) by mouth daily as needed for erectile dysfunction.  30 tablet  0   No current facility-administered medications on file prior to visit.    BP 144/92  Pulse 88  Temp(Src) 98.4 F (36.9 C) (Oral)  Ht 5' 5.5" (1.664 m)  Wt 184 lb (83.462 kg)  BMI 30.14 kg/m2    Objective:   Physical Exam  Constitutional: He is oriented to person, place, and time. He appears well-developed and well-nourished. No distress.  HENT:  Head: Normocephalic and atraumatic.  Neck: Neck supple.  Cardiovascular: Normal rate, regular rhythm and normal heart sounds.   No murmur heard. Pulmonary/Chest: Effort normal and breath sounds normal. He has no wheezes.  Musculoskeletal: He exhibits no edema.  Neurological: He is alert and oriented to person, place, and time. No cranial nerve deficit.  Skin:  See diabetic foot exam  Psychiatric: He has a  normal mood and affect. His behavior is normal.          Assessment & Plan:

## 2013-02-02 NOTE — Assessment & Plan Note (Signed)
Blood sugar control has significantly worsened secondary to dietary compliance. Increase Levemir to 30 units at bedtime. Add Humalog 10 units 10-15 minutes before evening meal. Continue metformin 500 mg twice daily. Patient urged to decrease his carbohydrate portions. Reassess in 3 weeks. Patient advised to complete blood test today to check his electrolytes and kidney function. He declined.

## 2013-02-02 NOTE — Assessment & Plan Note (Signed)
Patient never followed up at retinal specialist as directed. Patient strongly urged to reschedule appointment. We discussed possibility of losing his vision secondary to diabetic retinopathy.

## 2013-02-02 NOTE — Assessment & Plan Note (Signed)
Patient never filled Chantix.  Patient not ready to quit.

## 2013-02-02 NOTE — Patient Instructions (Signed)
Please complete the following lab tests before your next follow up appointment: BMET - 250.02

## 2013-02-02 NOTE — Assessment & Plan Note (Signed)
Stable.  No change in medication for now.

## 2013-02-07 ENCOUNTER — Other Ambulatory Visit: Payer: Self-pay | Admitting: Internal Medicine

## 2013-02-07 DIAGNOSIS — IMO0001 Reserved for inherently not codable concepts without codable children: Secondary | ICD-10-CM

## 2013-02-26 ENCOUNTER — Ambulatory Visit: Payer: BC Managed Care – PPO | Admitting: Internal Medicine

## 2013-02-26 DIAGNOSIS — Z0289 Encounter for other administrative examinations: Secondary | ICD-10-CM

## 2013-03-07 ENCOUNTER — Emergency Department (HOSPITAL_COMMUNITY)
Admission: EM | Admit: 2013-03-07 | Discharge: 2013-03-07 | Disposition: A | Discharge status: Home / Self Care | Payer: BC Managed Care – PPO | Attending: Emergency Medicine | Admitting: Emergency Medicine

## 2013-03-07 ENCOUNTER — Other Ambulatory Visit: Payer: Self-pay

## 2013-03-07 ENCOUNTER — Emergency Department (HOSPITAL_COMMUNITY): Payer: BC Managed Care – PPO

## 2013-03-07 ENCOUNTER — Encounter (HOSPITAL_COMMUNITY): Payer: Self-pay | Admitting: Emergency Medicine

## 2013-03-07 DIAGNOSIS — R000 Tachycardia, unspecified: Secondary | ICD-10-CM | POA: Insufficient documentation

## 2013-03-07 DIAGNOSIS — G43909 Migraine, unspecified, not intractable, without status migrainosus: Secondary | ICD-10-CM | POA: Insufficient documentation

## 2013-03-07 DIAGNOSIS — J029 Acute pharyngitis, unspecified: Secondary | ICD-10-CM | POA: Insufficient documentation

## 2013-03-07 DIAGNOSIS — R05 Cough: Secondary | ICD-10-CM | POA: Insufficient documentation

## 2013-03-07 DIAGNOSIS — R059 Cough, unspecified: Secondary | ICD-10-CM | POA: Insufficient documentation

## 2013-03-07 DIAGNOSIS — E119 Type 2 diabetes mellitus without complications: Secondary | ICD-10-CM | POA: Insufficient documentation

## 2013-03-07 DIAGNOSIS — Z79899 Other long term (current) drug therapy: Secondary | ICD-10-CM | POA: Insufficient documentation

## 2013-03-07 DIAGNOSIS — R509 Fever, unspecified: Secondary | ICD-10-CM | POA: Insufficient documentation

## 2013-03-07 DIAGNOSIS — Z880 Allergy status to penicillin: Secondary | ICD-10-CM | POA: Insufficient documentation

## 2013-03-07 DIAGNOSIS — F17200 Nicotine dependence, unspecified, uncomplicated: Secondary | ICD-10-CM | POA: Insufficient documentation

## 2013-03-07 DIAGNOSIS — E785 Hyperlipidemia, unspecified: Secondary | ICD-10-CM | POA: Insufficient documentation

## 2013-03-07 DIAGNOSIS — Z794 Long term (current) use of insulin: Secondary | ICD-10-CM | POA: Insufficient documentation

## 2013-03-07 DIAGNOSIS — IMO0001 Reserved for inherently not codable concepts without codable children: Secondary | ICD-10-CM | POA: Insufficient documentation

## 2013-03-07 DIAGNOSIS — R1084 Generalized abdominal pain: Secondary | ICD-10-CM | POA: Insufficient documentation

## 2013-03-07 DIAGNOSIS — R5381 Other malaise: Secondary | ICD-10-CM | POA: Insufficient documentation

## 2013-03-07 DIAGNOSIS — R079 Chest pain, unspecified: Secondary | ICD-10-CM | POA: Insufficient documentation

## 2013-03-07 DIAGNOSIS — R109 Unspecified abdominal pain: Secondary | ICD-10-CM

## 2013-03-07 DIAGNOSIS — I10 Essential (primary) hypertension: Secondary | ICD-10-CM | POA: Insufficient documentation

## 2013-03-07 DIAGNOSIS — R0682 Tachypnea, not elsewhere classified: Secondary | ICD-10-CM | POA: Insufficient documentation

## 2013-03-07 DIAGNOSIS — R197 Diarrhea, unspecified: Secondary | ICD-10-CM | POA: Insufficient documentation

## 2013-03-07 DIAGNOSIS — R112 Nausea with vomiting, unspecified: Secondary | ICD-10-CM | POA: Insufficient documentation

## 2013-03-07 HISTORY — DX: Hyperlipidemia, unspecified: E78.5

## 2013-03-07 LAB — URINALYSIS, ROUTINE W REFLEX MICROSCOPIC
Leukocytes, UA: NEGATIVE
Nitrite: NEGATIVE
Specific Gravity, Urine: 1.023 (ref 1.005–1.030)
pH: 5.5 (ref 5.0–8.0)

## 2013-03-07 LAB — CBC WITH DIFFERENTIAL/PLATELET
Basophils Relative: 0 % (ref 0–1)
Eosinophils Absolute: 0.2 10*3/uL (ref 0.0–0.7)
HCT: 45.1 % (ref 39.0–52.0)
Hemoglobin: 14.9 g/dL (ref 13.0–17.0)
Lymphs Abs: 2.5 10*3/uL (ref 0.7–4.0)
MCH: 28.2 pg (ref 26.0–34.0)
MCHC: 33 g/dL (ref 30.0–36.0)
MCV: 85.4 fL (ref 78.0–100.0)
Monocytes Absolute: 0.6 10*3/uL (ref 0.1–1.0)
Monocytes Relative: 7 % (ref 3–12)
Neutrophils Relative %: 64 % (ref 43–77)
WBC: 9.2 10*3/uL (ref 4.0–10.5)

## 2013-03-07 LAB — COMPREHENSIVE METABOLIC PANEL
ALT: 13 U/L (ref 0–53)
AST: 10 U/L (ref 0–37)
Albumin: 3.7 g/dL (ref 3.5–5.2)
Alkaline Phosphatase: 78 U/L (ref 39–117)
BUN: 14 mg/dL (ref 6–23)
CO2: 26 mEq/L (ref 19–32)
Calcium: 9.1 mg/dL (ref 8.4–10.5)
Chloride: 102 mEq/L (ref 96–112)
Creatinine, Ser: 0.87 mg/dL (ref 0.50–1.35)
GFR calc Af Amer: >90 mL/min (ref >90)
GFR calc non Af Amer: >90 mL/min (ref >90)
Glucose, Bld: 127 mg/dL — ABNORMAL HIGH (ref 70–99)
Potassium: 3.7 mEq/L (ref 3.5–5.1)
Sodium: 138 mEq/L (ref 135–145)
Total Bilirubin: 0.6 mg/dL (ref 0.3–1.2)
Total Protein: 7.5 g/dL (ref 6.0–8.3)

## 2013-03-07 LAB — LIPASE, BLOOD: Lipase: 11 U/L (ref 11–59)

## 2013-03-07 MED ORDER — ONDANSETRON 8 MG PO TBDP: 8.0000 mg | ORAL_TABLET | Freq: Three times a day (TID) | ORAL | Status: DC | PRN

## 2013-03-07 MED ORDER — IOHEXOL 300 MG/ML  SOLN
50.0000 mL | Freq: Once | INTRAMUSCULAR | Status: AC | PRN
  Administered 2013-03-07: 50 mL via ORAL

## 2013-03-07 MED ORDER — ONDANSETRON HCL 4 MG/2ML IJ SOLN
4.0000 mg | Freq: Once | INTRAMUSCULAR | Status: AC
  Administered 2013-03-07: 4 mg via INTRAVENOUS
  Filled 2013-03-07: qty 2

## 2013-03-07 MED ORDER — IOHEXOL 300 MG/ML  SOLN
100.0000 mL | Freq: Once | INTRAMUSCULAR | Status: AC | PRN
  Administered 2013-03-07: 100 mL via INTRAVENOUS

## 2013-03-07 MED ORDER — MORPHINE SULFATE 4 MG/ML IJ SOLN
4.0000 mg | Freq: Once | INTRAMUSCULAR | Status: AC
  Administered 2013-03-07: 4 mg via INTRAVENOUS
  Filled 2013-03-07: qty 1

## 2013-03-07 MED ORDER — SODIUM CHLORIDE 0.9 % IV BOLUS (SEPSIS)
1000.0000 mL | Freq: Once | INTRAVENOUS | Status: AC
  Administered 2013-03-07: 1000 mL via INTRAVENOUS

## 2013-03-07 MED ORDER — KETOROLAC TROMETHAMINE 30 MG/ML IJ SOLN
30.0000 mg | Freq: Once | INTRAMUSCULAR | Status: AC
  Administered 2013-03-07: 30 mg via INTRAVENOUS
  Filled 2013-03-07: qty 1

## 2013-03-07 NOTE — ED Notes (Signed)
Pt c/o intermittent central and L sided chest pain, intermittent generalized abdominal pain, migraine, and n/v/d x 1 day.  Pain score 7/10.

## 2013-03-07 NOTE — ED Notes (Signed)
He tells me he's had left-sided mid and lower abd. Pain in "sharp waves" since yesterday.  He grimaces suddenly occasionally as if in pain.  He denies fevr/n/v/d and is in no distress.

## 2013-03-07 NOTE — ED Provider Notes (Signed)
CSN: [id number]     Arrival date & time 03/07/13  0818 History   First MD Initiated Contact with Patient 03/07/13 [id number]     Chief Complaint  Patient presents with  . Abdominal Pain  . Chest Pain  . Diarrhea  . Migraine   (Consider location/radiation/quality/duration/timing/severity/associated sxs/prior Treatment) Patient is a 42 y.o. male presenting with abdominal pain, chest pain, diarrhea, and migraines. The history is provided by the patient.  Abdominal Pain Pain location:  Generalized Associated symptoms: chest pain, cough, diarrhea, fatigue, fever, nausea, sore throat and vomiting   Associated symptoms: no shortness of breath   Chest Pain Associated symptoms: abdominal pain, cough, fatigue, fever, headache, nausea and vomiting   Associated symptoms: no back pain, no numbness, no shortness of breath and no weakness   Diarrhea Associated symptoms: abdominal pain, fever, headaches, myalgias and vomiting   Migraine Associated symptoms include chest pain, abdominal pain and headaches. Pertinent negatives include no shortness of breath.   patient states that starting yesterday he began to have fevers chills, headache, cough, nausea vomiting diarrhea abdominal pain. He states he feels bad all over. He states he's not had an appetite. The headache is throbbing on the right side. Abdominal pain as dull and diffuse. No dysuria. No sick contacts. No blood in the stool or emesis. Patient states he has not checked his blood sugars in a couple weeks. He denies urinary frequency or polyuria  Past Medical History  Diagnosis Date  . Diabetes mellitus   . Hypertension   . Tobacco use   . Hyperlipidemia    Past Surgical History  Procedure Laterality Date  . Spinal tap     Family History  Problem Relation Age of Onset  . Cancer Mother   . Diabetes Father   . Hypertension Father    History  Substance Use Topics  . Smoking status: Current Every Day Smoker -- 1.00 packs/day for 20 years   Types: Cigarettes  . Smokeless tobacco: Never Used  . Alcohol Use: Yes     Comment: occ    Review of Systems  Constitutional: Positive for fever, appetite change and fatigue. Negative for activity change.  HENT: Positive for sore throat.   Eyes: Negative for pain.  Respiratory: Positive for cough. Negative for chest tightness and shortness of breath.   Cardiovascular: Positive for chest pain. Negative for leg swelling.  Gastrointestinal: Positive for nausea, vomiting, abdominal pain and diarrhea.  Genitourinary: Negative for flank pain.  Musculoskeletal: Positive for myalgias. Negative for back pain and neck stiffness.  Skin: Negative for rash.  Neurological: Positive for headaches. Negative for weakness and numbness.  Psychiatric/Behavioral: Negative for behavioral problems.    Allergies  Bee venom and Penicillins  Home Medications   Current Outpatient Rx  Name  Route  Sig  Dispense  Refill  . alum & mag hydroxide-simeth (MAALOX/MYLANTA) 200-200-20 MG/5ML suspension   Oral   Take 30 mLs by mouth every 6 (six) hours as needed for indigestion or heartburn.         Marland Kitchen amLODipine (NORVASC) 5 MG tablet   Oral   Take 1 tablet (5 mg total) by mouth daily.   90 tablet   1   . atorvastatin (LIPITOR) 20 MG tablet   Oral   Take 1 tablet (20 mg total) by mouth daily.   90 tablet   1   . Insulin Detemir (LEVEMIR FLEXPEN) 100 UNIT/ML SOPN   Subcutaneous   Inject 30 Units into the skin at bedtime.  9 mL   3   . insulin lispro (HUMALOG KWIKPEN) 100 UNIT/ML SOPN      Use 10 units 10-15 minutes before your evening meal   9 mL   1   . Insulin Pen Needle (B-D ULTRAFINE III SHORT PEN) 31G X 8 MM MISC      Use twice daily as directed   100 each   3   . lisinopril (PRINIVIL,ZESTRIL) 10 MG tablet   Oral   Take 1 tablet (10 mg total) by mouth daily.   90 tablet   1   . metFORMIN (GLUCOPHAGE) 500 MG tablet   Oral   Take 1 tablet (500 mg total) by mouth 2 (two) times  daily with a meal.   180 tablet   1   . ondansetron (ZOFRAN-ODT) 8 MG disintegrating tablet   Oral   Take 1 tablet (8 mg total) by mouth every 8 (eight) hours as needed for nausea or vomiting.   10 tablet   0    BP 160/98  Pulse 89  Temp(Src) 98.5 F (36.9 C) (Oral)  Resp 22  SpO2 100% Physical Exam  Nursing note and vitals reviewed. Constitutional: He is oriented to person, place, and time. He appears well-developed and well-nourished.  HENT:  Head: Normocephalic and atraumatic.  Minimal posterior pharyngeal erythema without exudate.  Eyes: EOM are normal. Pupils are equal, round, and reactive to light.  Neck: Normal range of motion. Neck supple.  Cardiovascular: Regular rhythm and normal heart sounds.   No murmur heard. Mild tachycardia  Pulmonary/Chest: Effort normal.  Mild tachypnea  Abdominal: Soft. Bowel sounds are normal. He exhibits no distension and no mass. There is tenderness. There is no rebound and no guarding.  Mild diffuse tenderness, worse on the right side. No hernias palpated  Musculoskeletal: Normal range of motion. He exhibits no edema.  Neurological: He is alert and oriented to person, place, and time. No cranial nerve deficit.  Skin: Skin is warm and dry.  Psychiatric: He has a normal mood and affect.    ED Course  Procedures (including critical care time) Labs Review Labs Reviewed  GLUCOSE, CAPILLARY - Abnormal; Notable for the following:    Glucose-Capillary 136 (*)    All other components within normal limits  COMPREHENSIVE METABOLIC PANEL - Abnormal; Notable for the following:    Glucose, Bld 127 (*)    All other components within normal limits  URINALYSIS, ROUTINE W REFLEX MICROSCOPIC - Abnormal; Notable for the following:    Bilirubin Urine SMALL (*)    All other components within normal limits  CBC WITH DIFFERENTIAL  LIPASE, BLOOD   Imaging Review Ct Abdomen Pelvis W Contrast  03/07/2013   CLINICAL DATA:  Left-sided mid abdominal  pain for 24 hr  EXAM: CT ABDOMEN AND PELVIS WITH CONTRAST  TECHNIQUE: Multidetector CT imaging of the abdomen and pelvis was performed using the standard protocol following bolus administration of intravenous contrast.  CONTRAST:  77mL OMNIPAQUE IOHEXOL 300 MG/ML SOLN, 174mL OMNIPAQUE IOHEXOL 300 MG/ML SOLN  COMPARISON:  09/28/2008  FINDINGS: Minimal dependent bibasilar subpleural presumed atelectasis is noted. Lung bases are otherwise clear.  At the dome of the right hepatic lobe, there is a 1.3 cm hypodense mass image 14, with this contiguous peripheral enhancement and subsequent suggestion of partial filling in on the renal delayed images series 7, incompletely visualized. 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 Retrospectively, this is not as well visualized on the prior exam due to  lack of contrast on the previous study.  Medial segment left hepatic lobe hypodensity is compatible with focal fat. Gallbladder, adrenal glands, right kidney, spleen, and pancreas are normal. 3 mm too small to characterize hypodensity left lower renal pole image 31, most likely corresponding to a previously seen presumed hyperdense cyst in this region. No hydroureteronephrosis or radiopaque ureteral or bladder calculus.  No ascites or lymphadenopathy. No free air. Appendix is normal. Bladder is normal. No acute osseous abnormality.  IMPRESSION: No acute intra-abdominal or pelvic pathology.   Electronically Signed   By: Conchita Paris M.D.   On: 03/07/2013 14:04    EKG Interpretation   None       Date: 03/07/2013  Rate: 105  Rhythm: sinus tachycardia  QRS Axis: normal  Intervals: normal  ST/T Wave abnormalities: normal  Conduction Disutrbances:none  Narrative Interpretation:   Old EKG Reviewed: none available    MDM   1. Abdominal pain   2. Nausea vomiting and diarrhea    Patient was abdominal pain and tenderness with nausea vomiting diarrhea. Feels better after IV fluids. He continued  tenderness and CT scan was done which was reassuring. Patient has tolerated orals will be discharged home.    Jasper Riling. Alvino Chapel, MD 03/07/13 [phone]

## 2013-07-10 ENCOUNTER — Emergency Department (HOSPITAL_COMMUNITY)
Admission: EM | Admit: 2013-07-10 | Discharge: 2013-07-10 | Disposition: A | Discharge status: Home / Self Care | Payer: Self-pay | Attending: Emergency Medicine | Admitting: Emergency Medicine

## 2013-07-10 ENCOUNTER — Encounter (HOSPITAL_COMMUNITY): Payer: Self-pay | Admitting: Emergency Medicine

## 2013-07-10 ENCOUNTER — Emergency Department (HOSPITAL_COMMUNITY): Payer: Self-pay

## 2013-07-10 DIAGNOSIS — I10 Essential (primary) hypertension: Secondary | ICD-10-CM | POA: Insufficient documentation

## 2013-07-10 DIAGNOSIS — H53149 Visual discomfort, unspecified: Secondary | ICD-10-CM | POA: Insufficient documentation

## 2013-07-10 DIAGNOSIS — R0602 Shortness of breath: Secondary | ICD-10-CM | POA: Insufficient documentation

## 2013-07-10 DIAGNOSIS — K5900 Constipation, unspecified: Secondary | ICD-10-CM | POA: Insufficient documentation

## 2013-07-10 DIAGNOSIS — R61 Generalized hyperhidrosis: Secondary | ICD-10-CM | POA: Insufficient documentation

## 2013-07-10 DIAGNOSIS — Z794 Long term (current) use of insulin: Secondary | ICD-10-CM | POA: Insufficient documentation

## 2013-07-10 DIAGNOSIS — Z79899 Other long term (current) drug therapy: Secondary | ICD-10-CM | POA: Insufficient documentation

## 2013-07-10 DIAGNOSIS — Z880 Allergy status to penicillin: Secondary | ICD-10-CM | POA: Insufficient documentation

## 2013-07-10 DIAGNOSIS — R109 Unspecified abdominal pain: Secondary | ICD-10-CM

## 2013-07-10 DIAGNOSIS — F17200 Nicotine dependence, unspecified, uncomplicated: Secondary | ICD-10-CM | POA: Insufficient documentation

## 2013-07-10 DIAGNOSIS — D708 Other neutropenia: Secondary | ICD-10-CM | POA: Insufficient documentation

## 2013-07-10 DIAGNOSIS — R059 Cough, unspecified: Secondary | ICD-10-CM | POA: Insufficient documentation

## 2013-07-10 DIAGNOSIS — R739 Hyperglycemia, unspecified: Secondary | ICD-10-CM

## 2013-07-10 DIAGNOSIS — R51 Headache: Secondary | ICD-10-CM | POA: Insufficient documentation

## 2013-07-10 DIAGNOSIS — E119 Type 2 diabetes mellitus without complications: Secondary | ICD-10-CM | POA: Insufficient documentation

## 2013-07-10 DIAGNOSIS — K6289 Other specified diseases of anus and rectum: Secondary | ICD-10-CM | POA: Insufficient documentation

## 2013-07-10 DIAGNOSIS — R509 Fever, unspecified: Secondary | ICD-10-CM | POA: Insufficient documentation

## 2013-07-10 DIAGNOSIS — D1803 Hemangioma of intra-abdominal structures: Secondary | ICD-10-CM

## 2013-07-10 DIAGNOSIS — K921 Melena: Secondary | ICD-10-CM | POA: Insufficient documentation

## 2013-07-10 DIAGNOSIS — H5789 Other specified disorders of eye and adnexa: Secondary | ICD-10-CM | POA: Insufficient documentation

## 2013-07-10 DIAGNOSIS — D72829 Elevated white blood cell count, unspecified: Secondary | ICD-10-CM | POA: Insufficient documentation

## 2013-07-10 DIAGNOSIS — R42 Dizziness and giddiness: Secondary | ICD-10-CM | POA: Insufficient documentation

## 2013-07-10 DIAGNOSIS — R05 Cough: Secondary | ICD-10-CM | POA: Insufficient documentation

## 2013-07-10 DIAGNOSIS — E785 Hyperlipidemia, unspecified: Secondary | ICD-10-CM | POA: Insufficient documentation

## 2013-07-10 DIAGNOSIS — R079 Chest pain, unspecified: Secondary | ICD-10-CM | POA: Insufficient documentation

## 2013-07-10 DIAGNOSIS — R197 Diarrhea, unspecified: Secondary | ICD-10-CM | POA: Insufficient documentation

## 2013-07-10 DIAGNOSIS — M7989 Other specified soft tissue disorders: Secondary | ICD-10-CM | POA: Insufficient documentation

## 2013-07-10 LAB — CBG MONITORING, ED
Glucose-Capillary: 264 mg/dL — ABNORMAL HIGH (ref 70–99)
Glucose-Capillary: 228 mg/dL — ABNORMAL HIGH (ref 70–99)

## 2013-07-10 LAB — BASIC METABOLIC PANEL
BUN: 13 mg/dL (ref 6–23)
CALCIUM: 9.2 mg/dL (ref 8.4–10.5)
CO2: 27 meq/L (ref 19–32)
CREATININE: 0.82 mg/dL (ref 0.50–1.35)
Chloride: 100 mEq/L (ref 96–112)
GFR calc non Af Amer: >90 mL/min (ref >90)
Glucose, Bld: 339 mg/dL — ABNORMAL HIGH (ref 70–99)
Potassium: 4.3 mEq/L (ref 3.7–5.3)
Sodium: 139 mEq/L (ref 137–147)

## 2013-07-10 LAB — CBC
HEMATOCRIT: 44.1 % (ref 39.0–52.0)
Hemoglobin: 15.4 g/dL (ref 13.0–17.0)
MCH: 30.2 pg (ref 26.0–34.0)
MCHC: 34.9 g/dL (ref 30.0–36.0)
MCV: 86.5 fL (ref 78.0–100.0)
Platelets: 200 10*3/uL (ref 150–400)
RBC: 5.1 MIL/uL (ref 4.22–5.81)
RDW: 14 % (ref 11.5–15.5)
WBC: 11.9 10*3/uL — AB (ref 4.0–10.5)

## 2013-07-10 LAB — URINALYSIS, ROUTINE W REFLEX MICROSCOPIC
Bilirubin Urine: NEGATIVE
HGB URINE DIPSTICK: NEGATIVE
KETONES UR: NEGATIVE mg/dL
LEUKOCYTES UA: NEGATIVE
Nitrite: NEGATIVE
PH: 5.5 (ref 5.0–8.0)
Protein, ur: NEGATIVE mg/dL
Specific Gravity, Urine: 1.039 — ABNORMAL HIGH (ref 1.005–1.030)
Urobilinogen, UA: 1 mg/dL (ref 0.0–1.0)

## 2013-07-10 LAB — URINE MICROSCOPIC-ADD ON

## 2013-07-10 LAB — I-STAT TROPONIN, ED
TROPONIN I, POC: 0 ng/mL (ref 0.00–0.08)
Troponin i, poc: 0 ng/mL (ref 0.00–0.08)

## 2013-07-10 LAB — PRO B NATRIURETIC PEPTIDE: Pro B Natriuretic peptide (BNP): 23.1 pg/mL (ref 0–125)

## 2013-07-10 LAB — LIPASE, BLOOD: Lipase: 16 U/L (ref 11–59)

## 2013-07-10 LAB — HEPATIC FUNCTION PANEL
ALK PHOS: 117 U/L (ref 39–117)
ALT: 11 U/L (ref 0–53)
AST: 14 U/L (ref 0–37)
Albumin: 3.4 g/dL — ABNORMAL LOW (ref 3.5–5.2)
BILIRUBIN TOTAL: 0.2 mg/dL — AB (ref 0.3–1.2)
Bilirubin, Direct: <0.2 mg/dL (ref 0.0–0.3)
TOTAL PROTEIN: 7.2 g/dL (ref 6.0–8.3)

## 2013-07-10 LAB — POC OCCULT BLOOD, ED: FECAL OCCULT BLD: NEGATIVE

## 2013-07-10 MED ORDER — IOHEXOL 300 MG/ML  SOLN
80.0000 mL | Freq: Once | INTRAMUSCULAR | Status: AC | PRN
  Administered 2013-07-10: 80 mL via INTRAVENOUS

## 2013-07-10 MED ORDER — IOHEXOL 300 MG/ML  SOLN
25.0000 mL | Freq: Once | INTRAMUSCULAR | Status: AC | PRN
  Administered 2013-07-10: 25 mL via ORAL

## 2013-07-10 MED ORDER — MORPHINE SULFATE 4 MG/ML IJ SOLN
4.0000 mg | Freq: Once | INTRAMUSCULAR | Status: AC
  Administered 2013-07-10: 4 mg via INTRAVENOUS
  Filled 2013-07-10: qty 1

## 2013-07-10 MED ORDER — ONDANSETRON HCL 4 MG/2ML IJ SOLN
4.0000 mg | Freq: Once | INTRAMUSCULAR | Status: AC
  Administered 2013-07-10: 4 mg via INTRAVENOUS
  Filled 2013-07-10: qty 2

## 2013-07-10 MED ORDER — ASPIRIN 81 MG PO CHEW
324.0000 mg | CHEWABLE_TABLET | Freq: Once | ORAL | Status: AC
  Administered 2013-07-10: 324 mg via ORAL
  Filled 2013-07-10: qty 4

## 2013-07-10 MED ORDER — SODIUM CHLORIDE 0.9 % IV BOLUS (SEPSIS)
1000.0000 mL | Freq: Once | INTRAVENOUS | Status: AC
  Administered 2013-07-10: 1000 mL via INTRAVENOUS

## 2013-07-10 MED ORDER — MORPHINE SULFATE 4 MG/ML IJ SOLN: 4.0000 mg | Freq: Once | INTRAMUSCULAR | Status: DC

## 2013-07-10 MED ORDER — HYDROMORPHONE HCL PF 1 MG/ML IJ SOLN
1.0000 mg | Freq: Once | INTRAMUSCULAR | Status: AC
  Administered 2013-07-10: 1 mg via INTRAVENOUS
  Filled 2013-07-10: qty 1

## 2013-07-10 NOTE — ED Notes (Signed)
Pt. reports left chest pain onset last night with slight SOB and occasional dry cough , denies nausea or diaphoresis .

## 2013-07-10 NOTE — Discharge Instructions (Signed)
Call and make follow up appointment with your doctor in 2 days. Return to Emergency department if you develop any worsening symptoms of chest pain or shortness of breath.    Chest Pain (Nonspecific) Chest pain has many causes. Your pain could be caused by something serious, such as a heart attack or a blood clot in the lungs. It could also be caused by something less serious, such as a chest bruise or a virus. Follow up with your doctor. More lab tests or other studies may be needed to find the cause of your pain. Most of the time, nonspecific chest pain will improve within 2 to 3 days of rest and mild pain medicine. HOME CARE  For chest bruises, you may put ice on the sore area for 15-20 minutes, 03-04 times a day. Do this only if it makes you feel better.  Put ice in a plastic bag.  Place a towel between the skin and the bag.  Rest for the next 2 to 3 days.  Go back to work if the pain improves.  See your doctor if the pain lasts longer than 1 to 2 weeks.  Only take medicine as told by your doctor.  Quit smoking if you smoke. GET HELP RIGHT AWAY IF:   There is more pain or pain that spreads to the arm, neck, jaw, back, or belly (abdomen).  You have shortness of breath.  You cough more than usual or cough up blood.  You have very bad back or belly pain, feel sick to your stomach (nauseous), or throw up (vomit).  You have very bad weakness.  You pass out (faint).  You have a fever. Any of these problems may be serious and may be an emergency. Do not wait to see if the problems will go away. Get medical help right away. Call your local emergency services 911 in [place] Do not drive yourself to the hospital. MAKE SURE YOU:   Understand these instructions.  Will watch this condition.  Will get help right away if you or your child is not doing well or gets worse. Document Released: 09/01/2007 Document Revised: 06/07/2011 Document Reviewed: 09/01/2007 [facility] Patient  Information 2014 [place], [place].  High Blood Sugar High blood sugar (hyperglycemia) means that the level of sugar in your blood is higher than it should be. Signs of high blood sugar include:  Feeling thirsty.  Frequent peeing (urinating).  Feeling tired or sleepy.  Dry mouth.  Vision changes.  Feeling weak.  Feeling hungry but losing weight.  Numbness and tingling in your hands or feet.  Headache. When you ignore these signs, your blood sugar may keep going up. These problems may get worse, and other problems may begin. HOME CARE  Check your blood sugars as told by your doctor. Write down the numbers with the date and time.  Take the right amount of insulin or diabetes pills at the right time. Write down the dose with date and time.  Refill your insulin or diabetes pills before running out.  Watch what you eat. Follow your meal plan.  Drink liquids without sugar, such as water. Check with your doctor if you have kidney or heart disease.  Follow your doctor's orders for exercise. Exercise at the same time of day.  Keep your doctor's appointments. GET HELP RIGHT AWAY IF:   You have trouble thinking or are confused.  You have fast breathing with fruity smelling breath.  You pass out (faint).  You have 2 to 3  days of high blood sugars and you do not know why.  You have chest pain.  You are feeling sick to your stomach (nauseous) or throwing up (vomiting).  You have sudden vision changes. MAKE SURE YOU:   Understand these instructions.  Will watch your condition.  Will get help right away if you are not doing well or get worse. Document Released: 01/10/2009 Document Revised: 06/07/2011 Document Reviewed: 01/10/2009 [facility] Patient Information 2014 [place], [place].

## 2013-07-10 NOTE — ED Provider Notes (Signed)
CSN: [id number]     Arrival date & time 07/10/13  [id number] History   First MD Initiated Contact with Patient 07/10/13 0602     Chief Complaint  Patient presents with  . Chest Pain     (Consider location/radiation/quality/duration/timing/severity/associated sxs/prior Treatment) HPI 43 yo male with hx of DM, HTN, HLD, and 20 pack year hx smoker presents with chest pain, that started last night at 11pm. Patient states pain was acute in onset reaching maximal pain within 20 min. Pain described as left sided sharp 6/10 pain constant without radiation. Patient admits to associated SOB. Patient states he was at rest in his bed when pain started. Patient admit to similar episodes in past. Pain worse with movement. Patient admits to dizziness this morning when he woke up to go to the bathroom. Admits to sweats this morning as well. Denies any palpitations or vomiting.    CAD Risk Factors: HTN: Yes Hyperlipidemia: Yes Cigarette smoking: Yes Diabetes Mellitus: Yes Family hx of CAD or MI < 62 yo : Yes Cocaine Use: No  Heart Score: ( a score 0-3 are low risk with less than 2% risk of MACE at 6 weeks.) Suspicion: 1 EKG:0 AGE:72 Risk Factors:2 Troponin:0 Total: 3  PERC Criteria: Age > 52 yo: No HR > 100 bpm: No O2 sat on RA < 95%: No Prior hx of DVT/PE:No Trauma or surgery in past 4 wks:No Hemoptysis:No Exogenous Estrogen/Testosterone use:No Unilateral Leg swelling: No     Past Medical History  Diagnosis Date  . Diabetes mellitus   . Hypertension   . Tobacco use   . Hyperlipidemia    Past Surgical History  Procedure Laterality Date  . Spinal tap     Family History  Problem Relation Age of Onset  . Cancer Mother   . Diabetes Father   . Hypertension Father    History  Substance Use Topics  . Smoking status: Current Every Day Smoker -- 1.00 packs/day for 20 years    Types: Cigarettes  . Smokeless tobacco: Never Used  . Alcohol Use: Yes     Comment: occ    Review of  Systems  Constitutional: Positive for fever and chills.  Eyes: Positive for photophobia and redness. Negative for pain and visual disturbance.  Respiratory: Positive for cough (dry ). Negative for wheezing.   Cardiovascular: Positive for leg swelling (left leg x 1 month).  Gastrointestinal: Positive for abdominal pain (lower abdominal pain), diarrhea (3 episodes since last night), constipation (x 1 week), blood in stool (Started last night. BRBPR. "strings of blood". No hx of hemorrhoids. ) and rectal pain (with BMs, started one month ago. ).  Genitourinary: Negative for dysuria, hematuria, discharge, penile swelling, scrotal swelling, penile pain and testicular pain.  Neurological: Positive for headaches (Sharp HA to left temple started last night also).  All other systems reviewed and are negative.     Allergies  Bee venom and Penicillins  Home Medications   Current Outpatient Rx  Name  Route  Sig  Dispense  Refill  . amLODipine (NORVASC) 5 MG tablet   Oral   Take 1 tablet (5 mg total) by mouth daily.   90 tablet   1   . atorvastatin (LIPITOR) 20 MG tablet   Oral   Take 1 tablet (20 mg total) by mouth daily.   90 tablet   1   . Insulin Detemir (LEVEMIR FLEXPEN) 100 UNIT/ML SOPN   Subcutaneous   Inject 30 Units into the skin at bedtime.  9 mL   3   . insulin lispro (HUMALOG KWIKPEN) 100 UNIT/ML SOPN      Use 10 units 10-15 minutes before your evening meal   9 mL   1   . lisinopril (PRINIVIL,ZESTRIL) 10 MG tablet   Oral   Take 1 tablet (10 mg total) by mouth daily.   90 tablet   1   . metFORMIN (GLUCOPHAGE) 500 MG tablet   Oral   Take 1 tablet (500 mg total) by mouth 2 (two) times daily with a meal.   180 tablet   1   . Insulin Pen Needle (B-D ULTRAFINE III SHORT PEN) 31G X 8 MM MISC      Use twice daily as directed   100 each   3    BP 145/89  Pulse 80  Temp(Src) 98.7 F (37.1 C)  Resp 14  SpO2 100% Physical Exam  ED Course  Procedures  (including critical care time) Labs Review Labs Reviewed  CBC - Abnormal; Notable for the following:    WBC 11.9 (*)    All other components within normal limits  BASIC METABOLIC PANEL - Abnormal; Notable for the following:    Glucose, Bld 339 (*)    All other components within normal limits  HEPATIC FUNCTION PANEL - Abnormal; Notable for the following:    Albumin 3.4 (*)    Total Bilirubin 0.2 (*)    All other components within normal limits  URINALYSIS, ROUTINE W REFLEX MICROSCOPIC - Abnormal; Notable for the following:    Specific Gravity, Urine 1.039 (*)    Glucose, UA >1000 (*)    All other components within normal limits  CBG MONITORING, ED - Abnormal; Notable for the following:    Glucose-Capillary 264 (*)    All other components within normal limits  CBG MONITORING, ED - Abnormal; Notable for the following:    Glucose-Capillary 228 (*)    All other components within normal limits  PRO B NATRIURETIC PEPTIDE  LIPASE, BLOOD  URINE MICROSCOPIC-ADD ON  OCCULT BLOOD X 1 CARD TO LAB, STOOL  I-STAT TROPOININ, ED  I-STAT TROPOININ, ED  POC OCCULT BLOOD, ED   Imaging Review Dg Chest 2 View  07/10/2013   CLINICAL DATA:  CHEST PAIN  EXAM: CHEST  2 VIEW  COMPARISON:  DG CHEST 2 VIEW dated 07/24/2012  FINDINGS: The heart size and mediastinal contours are within normal limits. Both lungs are clear. The visualized skeletal structures are unremarkable.  IMPRESSION: No active cardiopulmonary disease.   Electronically Signed   By: Kathreen Devoid   On: 07/10/2013 06:19   Ct Abdomen Pelvis W Contrast  07/10/2013   CLINICAL DATA:  Abdominal pain.  EXAM: CT ABDOMEN AND PELVIS WITH CONTRAST  TECHNIQUE: Multidetector CT imaging of the abdomen and pelvis was performed using the standard protocol following bolus administration of intravenous contrast.  CONTRAST:  23mL OMNIPAQUE IOHEXOL 300 MG/ML  SOLN  COMPARISON:  CT ABD/PELVIS W CM dated 03/07/2013  FINDINGS: Lung bases are clear  Stable 1.3 cm low  density nodule right lobe of the liver. Liver is otherwise unremarkable. The spleen, adrenals, kidneys, pancreas are unremarkable.  Gallbladder and gallbladder fossa are unremarkable.  The bowel is negative, appendix identified negative.  No abdominal aortic aneurysm. Celiac, SMA, IMA, portal vein unremarkable  No abdominal nor pelvic masses, free fluid, loculated fluid collections, nor adenopathy.  There is no abdominal wall nor inguinal hernia.  There are no aggressive appearing osseous lesions. Degenerative disc disease changes  at L5-S1.  IMPRESSION: Stable likely small hemangioma within the liver. Otherwise no intra abdominal or pelvic pathology. No CT evidence accounting for the patient's clinical presentation.   Electronically Signed   By: Margaree Mackintosh M.D.   On: 07/10/2013 10:00     EKG Interpretation   Date/Time:  Tuesday July 10 2013 05:56:51 EDT Ventricular Rate:  108 PR Interval:  134 QRS Duration: 76 QT Interval:  324 QTC Calculation: 434 R Axis:   87 Text Interpretation:  Sinus tachycardia Right atrial enlargement  Borderline ECG Confirmed by WARD,  DO, KRISTEN [id number]) on 07/10/2013  6:02:56 AM      MDM   Final diagnoses:  Chest pain  Abdominal pain  Hyperglycemia   6:00 am - Patient initially with Tender abdomen on exam. Patient given a liter of fluids and IV pain medication.  7:30 am - Reexamined afterwards and continues to be significantly tender on exam. Discussed patient with Dr. Rogene Houston. Plan to get CT abdomen due to significant abdominal tenderness.   Filed Vitals:   07/10/13 0743 07/10/13 0747 07/10/13 0859  BP: 154/88  145/89  Pulse: 85  80  Temp:  98.7 F (37.1 C)   Resp:   14  SpO2: 100%  100%   Patient afebrile with stable VS.  Hemoccult negative - doubt GI bleed UA consistent with dehydration. Patietn given IV fluids. Mild leukocytosis to 11.9  Hyperglycemia at 339, no gap present. No ketonuria . Doubt DKA. Glucose improved to 228 with IV  fluids Hepatic function appears wnl  Lipase negative. Doubt pancreatitis.   CT negative for any acute changes. Small hemangioma of liver.    Heart Score = 3. Delta troponin negative, Doubt ACS.   Patient PERC negative. PE unlikely. CXR shows no evidence of widened mediastinum, pneumoperitoneum, or pneumomediastinum. Doubt aortic dissection or esophageal rupture. no Cardiomegaly on CXR and no evidence of low voltage on EKG. Doubt Pericardial tamponade.   Discussed results with patient. Patient states he is feeling better after tx. Patient advised to follow up in 2 days with his PCP for further management of his chronic medical conditions. Patient agrees with plan. Return precautions given for any worsening CP, SOB, abdominal pain. Discharged in good condition.   Meds given in ED:  Medications  sodium chloride 0.9 % bolus 1,000 mL (0 mLs Intravenous Stopped 07/10/13 0752)  morphine 4 MG/ML injection 4 mg (4 mg Intravenous Given 07/10/13 0652)  ondansetron (ZOFRAN) injection 4 mg (4 mg Intravenous Given 07/10/13 0652)  sodium chloride 0.9 % bolus 1,000 mL (0 mLs Intravenous Stopped 07/10/13 1035)  aspirin chewable tablet 324 mg (324 mg Oral Given 07/10/13 0753)  iohexol (OMNIPAQUE) 300 MG/ML solution 25 mL (25 mLs Oral Contrast Given 07/10/13 0809)  ondansetron (ZOFRAN) injection 4 mg (4 mg Intravenous Given 07/10/13 0801)  HYDROmorphone (DILAUDID) injection 1 mg (1 mg Intravenous Given 07/10/13 0804)  iohexol (OMNIPAQUE) 300 MG/ML solution 80 mL (80 mLs Intravenous Contrast Given 07/10/13 0917)    New Prescriptions   No medications on file     Sherrie George, PA-C 07/11/13 1507

## 2013-07-10 NOTE — ED Provider Notes (Addendum)
Medical screening examination/treatment/procedure(s) were conducted as a shared visit with non-physician practitioner(s) and myself.  I personally evaluated the patient during the encounter.   EKG Interpretation   Date/Time:  Tuesday July 10 2013 05:56:51 EDT Ventricular Rate:  108 PR Interval:  134 QRS Duration: 76 QT Interval:  324 QTC Calculation: 434 R Axis:   87 Text Interpretation:  Sinus tachycardia Right atrial enlargement  Borderline ECG Confirmed by WARD,  DO, KRISTEN [id number]) on 07/10/2013  6:02:56 AM      Results for orders placed during the hospital encounter of 07/10/13  CBC      Result Value Ref Range   WBC 11.9 (*) 4.0 - 10.5 K/uL   RBC 5.10  4.22 - 5.81 MIL/uL   Hemoglobin 15.4  13.0 - 17.0 g/dL   HCT 44.1  39.0 - 52.0 %   MCV 86.5  78.0 - 100.0 fL   MCH 30.2  26.0 - 34.0 pg   MCHC 34.9  30.0 - 36.0 g/dL   RDW 14.0  11.5 - 15.5 %   Platelets 200  150 - 400 K/uL  BASIC METABOLIC PANEL      Result Value Ref Range   Sodium 139  137 - 147 mEq/L   Potassium 4.3  3.7 - 5.3 mEq/L   Chloride 100  96 - 112 mEq/L   CO2 27  19 - 32 mEq/L   Glucose, Bld 339 (*) 70 - 99 mg/dL   BUN 13  6 - 23 mg/dL   Creatinine, Ser 0.82  0.50 - 1.35 mg/dL   Calcium 9.2  8.4 - 10.5 mg/dL   GFR calc non Af Amer >90  >90 mL/min   GFR calc Af Amer >90  >90 mL/min  PRO B NATRIURETIC PEPTIDE      Result Value Ref Range   Pro B Natriuretic peptide (BNP) 23.1  0 - 125 pg/mL  HEPATIC FUNCTION PANEL      Result Value Ref Range   Total Protein 7.2  6.0 - 8.3 g/dL   Albumin 3.4 (*) 3.5 - 5.2 g/dL   AST 14  0 - 37 U/L   ALT 11  0 - 53 U/L   Alkaline Phosphatase 117  39 - 117 U/L   Total Bilirubin 0.2 (*) 0.3 - 1.2 mg/dL   Bilirubin, Direct <0.2  0.0 - 0.3 mg/dL   Indirect Bilirubin NOT CALCULATED  0.3 - 0.9 mg/dL  LIPASE, BLOOD      Result Value Ref Range   Lipase 16  11 - 59 U/L  URINALYSIS, ROUTINE W REFLEX MICROSCOPIC      Result Value Ref Range   Color, Urine YELLOW  YELLOW   APPearance CLEAR  CLEAR   Specific Gravity, Urine 1.039 (*) 1.005 - 1.030   pH 5.5  5.0 - 8.0   Glucose, UA >1000 (*) NEGATIVE mg/dL   Hgb urine dipstick NEGATIVE  NEGATIVE   Bilirubin Urine NEGATIVE  NEGATIVE   Ketones, ur NEGATIVE  NEGATIVE mg/dL   Protein, ur NEGATIVE  NEGATIVE mg/dL   Urobilinogen, UA 1.0  0.0 - 1.0 mg/dL   Nitrite NEGATIVE  NEGATIVE   Leukocytes, UA NEGATIVE  NEGATIVE  URINE MICROSCOPIC-ADD ON      Result Value Ref Range   Squamous Epithelial / LPF RARE  RARE   WBC, UA 0-2  <3 WBC/hpf   Bacteria, UA RARE  RARE   Urine-Other SPERM PRESENT    CBG MONITORING, ED      Result Value  Ref Range   Glucose-Capillary 264 (*) 70 - 99 mg/dL   Dg Chest 2 View  07/10/2013   CLINICAL DATA:  CHEST PAIN  EXAM: CHEST  2 VIEW  COMPARISON:  DG CHEST 2 VIEW dated 07/24/2012  FINDINGS: The heart size and mediastinal contours are within normal limits. Both lungs are clear. The visualized skeletal structures are unremarkable.  IMPRESSION: No active cardiopulmonary disease.   Electronically Signed   By: Kathreen Devoid   On: 07/10/2013 06:19    Patient with onset of chest pain left-sided abdominal pain nausea vomiting and diarrhea last evening at 11 PM. Patient has been seen for this in the past last time was December. Patient does have several risk factors for coronary artery disease. EKG without acute changes. Chest x-rays negative for pneumonia pneumothorax or pulmonary edema. Labs without significant abnormalities. Patient has marked tenderness in the left side of the abdomen CT abdomen need to be done and serial troponins will need to be done. In addition patient's lipase and liver function tests without any significant abnormalities no evidence of pancreatitis.  Mervin Kung, MD 07/10/13 [id number]  Mervin Kung, MD 07/30/13 [phone]

## 2013-07-20 ENCOUNTER — Encounter: Payer: Self-pay | Admitting: Internal Medicine

## 2013-07-20 ENCOUNTER — Ambulatory Visit (INDEPENDENT_AMBULATORY_CARE_PROVIDER_SITE_OTHER): Payer: Self-pay | Admitting: Internal Medicine

## 2013-07-20 VITALS — BP 150/102 | HR 100 | Temp 98.1°F | Ht 65.5 in | Wt 178.0 lb

## 2013-07-20 DIAGNOSIS — F17200 Nicotine dependence, unspecified, uncomplicated: Secondary | ICD-10-CM

## 2013-07-20 DIAGNOSIS — R079 Chest pain, unspecified: Secondary | ICD-10-CM | POA: Insufficient documentation

## 2013-07-20 DIAGNOSIS — I10 Essential (primary) hypertension: Secondary | ICD-10-CM

## 2013-07-20 LAB — D-DIMER, QUANTITATIVE: D-Dimer, Quant: 0.27 ug/mL-FEU (ref 0.00–0.48)

## 2013-07-20 MED ORDER — LISINOPRIL 20 MG PO TABS: 20.0000 mg | ORAL_TABLET | Freq: Every day | ORAL | Status: DC

## 2013-07-20 MED ORDER — OMEPRAZOLE 20 MG PO CPDR: 20.0000 mg | DELAYED_RELEASE_CAPSULE | Freq: Every day | ORAL | Status: DC

## 2013-07-20 MED ORDER — AMLODIPINE BESYLATE 5 MG PO TABS: 5.0000 mg | ORAL_TABLET | Freq: Every day | ORAL | Status: DC

## 2013-07-20 MED ORDER — METFORMIN HCL 500 MG PO TABS: 500.0000 mg | ORAL_TABLET | Freq: Two times a day (BID) | ORAL | Status: DC

## 2013-07-20 MED ORDER — ATORVASTATIN CALCIUM 20 MG PO TABS: 20.0000 mg | ORAL_TABLET | Freq: Every day | ORAL | Status: DC

## 2013-07-20 NOTE — Assessment & Plan Note (Signed)
Smoking cessation strongly encouraged.

## 2013-07-20 NOTE — Assessment & Plan Note (Signed)
Patient's blood pressure is poorly controlled. Increase lisinopril to 20 mg and continue amlodipine 5 mg. BP: 150/102 mmHg

## 2013-07-20 NOTE — Progress Notes (Signed)
Subjective:    Patient ID: [name], male    DOB: October 17, 1970, 43 y.o.   MRN: [id number]  HPI  43 year old African American male with history of uncontrolled type 2 diabetes, hypertension and hyperlipidemia for emergency room followup. Patient was seen in 07/10/2013 secondary to sharp left-sided chest pain. Patient reports he started having symptoms the night before around 11 PM. He woke up experiencing sharp intermittent pain that was nonexertional. He reports he experienced mild associated shortness of breath. Patient also complained of abdominal discomfort. He reports he has experienced exertional chest tightness in the past.  Emergency room workup included CT of abdomen and pelvis which was unremarkable. His cardiac enzyme was negative and serum lipase was normal. His chest x-ray also unremarkable. Patient was given aspirin and IV morphine in his symptoms improved.  Patient reports chest discomfort has significantly improved.  Patient reports he had associated migraine headache.  He still smokes 1 pack per day despite medical advice.   Review of Systems Negative for leg swelling.  Headache.  Chest wall tenderness.  He denies heartburn symptoms.    Past Medical History  Diagnosis Date  . Diabetes mellitus type II, uncontrolled   . Hypertension   . Tobacco use   . Hyperlipidemia     History   Social History  . Marital Status: Single    Spouse Name: N/A    Number of Children: N/A  . Years of Education: N/A   Occupational History  . 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 Smoking status: Current Every Day Smoker -- 1.00 packs/day for 20 years    Types: Cigarettes  . Smokeless tobacco: Never Used  . Alcohol Use: Yes     Comment: occ  . Drug Use: No  . Sexual Activity: Not Currently   Other Topics Concern  . Not on file   Social History Narrative  . No narrative on file    Past Surgical History  Procedure  Laterality Date  . Spinal tap      Family History  Problem Relation Age of Onset  . Cancer Mother   . Diabetes Father   . Hypertension Father     Allergies  Allergen Reactions  . Bee Venom Swelling  . Penicillins Hives    Current Outpatient Prescriptions on File Prior to Visit  Medication Sig Dispense Refill  . Insulin Detemir (LEVEMIR FLEXPEN) 100 UNIT/ML SOPN Inject 30 Units into the skin at bedtime.  9 mL  3  . insulin lispro (HUMALOG KWIKPEN) 100 UNIT/ML SOPN Use 10 units 10-15 minutes before your evening meal  9 mL  1  . Insulin Pen Needle (B-D ULTRAFINE III SHORT PEN) 31G X 8 MM MISC Use twice daily as directed  100 each  3   No current facility-administered medications on file prior to visit.    BP 150/102  Pulse 100  Temp(Src) 98.1 F (36.7 C) (Oral)  Ht 5' 5.5" (1.664 m)  Wt 178 lb (80.74 kg)  BMI 29.16 kg/m2     Objective:   Physical Exam  Constitutional: He is oriented to person, place, and time. He appears well-developed and well-nourished. No distress.  HENT:  Head: Normocephalic and atraumatic.  Right Ear: External ear normal.  Left Ear: External ear normal.  Neck: Neck supple.  No carotid bruit  Cardiovascular: Normal rate, regular rhythm and normal heart sounds.   Pulmonary/Chest: Effort normal and breath sounds normal.  He has no wheezes.  Left chest wall tenderness (lateral to left nipple)  Musculoskeletal: He exhibits no edema.  Neurological: He is alert and oriented to person, place, and time. No cranial nerve deficit.  Skin: Skin is warm and dry.  Psychiatric: He has a normal mood and affect. His behavior is normal.          Assessment & Plan:

## 2013-07-20 NOTE — Assessment & Plan Note (Addendum)
43 year old [place] American male with recently evaluated in emergency room for atypical chest pain. He reports he has had exertional symptoms in the past. His cardiac enzyme was negative. Obtain d-dimer.  He also complains of abdominal pain.  Trial of empiric PPI.  Refer to cardiology considering multiple risk factors for ischemic heart disease.  EKG shows normal sinus rhythm at 96 beats per minute.

## 2013-07-20 NOTE — Progress Notes (Signed)
Pre visit review using our clinic review tool, if applicable. No additional management support is needed unless otherwise documented below in the visit note. 

## 2013-07-23 ENCOUNTER — Telehealth: Payer: Self-pay | Admitting: Internal Medicine

## 2013-07-23 NOTE — Telephone Encounter (Signed)
Relevant patient education mailed to patient.  

## 2013-07-26 ENCOUNTER — Encounter: Payer: Self-pay | Admitting: Interventional Cardiology

## 2013-07-26 ENCOUNTER — Ambulatory Visit (INDEPENDENT_AMBULATORY_CARE_PROVIDER_SITE_OTHER): Payer: Self-pay | Admitting: Interventional Cardiology

## 2013-07-26 ENCOUNTER — Encounter: Payer: Self-pay | Admitting: Cardiology

## 2013-07-26 VITALS — BP 160/90 | HR 102 | Ht 65.0 in | Wt 176.0 lb

## 2013-07-26 DIAGNOSIS — N529 Male erectile dysfunction, unspecified: Secondary | ICD-10-CM

## 2013-07-26 DIAGNOSIS — F17200 Nicotine dependence, unspecified, uncomplicated: Secondary | ICD-10-CM

## 2013-07-26 DIAGNOSIS — I10 Essential (primary) hypertension: Secondary | ICD-10-CM

## 2013-07-26 DIAGNOSIS — I209 Angina pectoris, unspecified: Secondary | ICD-10-CM

## 2013-07-26 LAB — CBC WITH DIFFERENTIAL/PLATELET
BASOS ABS: 0 10*3/uL (ref 0.0–0.1)
Basophils Relative: 0.4 % (ref 0.0–3.0)
Eosinophils Absolute: 0.2 10*3/uL (ref 0.0–0.7)
Eosinophils Relative: 1.6 % (ref 0.0–5.0)
HCT: 50.4 % (ref 39.0–52.0)
Hemoglobin: 16.4 g/dL (ref 13.0–17.0)
LYMPHS ABS: 2.9 10*3/uL (ref 0.7–4.0)
Lymphocytes Relative: 27.3 % (ref 12.0–46.0)
MCHC: 32.5 g/dL (ref 30.0–36.0)
MCV: 88.8 fl (ref 78.0–100.0)
MONO ABS: 0.5 10*3/uL (ref 0.1–1.0)
MONOS PCT: 4.4 % (ref 3.0–12.0)
Neutro Abs: 7.1 10*3/uL (ref 1.4–7.7)
Neutrophils Relative %: 66.3 % (ref 43.0–77.0)
PLATELETS: 243 10*3/uL (ref 150.0–400.0)
RBC: 5.68 Mil/uL (ref 4.22–5.81)
RDW: 15.8 % — AB (ref 11.5–14.6)
WBC: 10.7 10*3/uL — ABNORMAL HIGH (ref 4.5–10.5)

## 2013-07-26 LAB — BASIC METABOLIC PANEL
BUN: 10 mg/dL (ref 6–23)
CO2: 30 mEq/L (ref 19–32)
Calcium: 10.2 mg/dL (ref 8.4–10.5)
Chloride: 99 mEq/L (ref 96–112)
Creatinine, Ser: 0.8 mg/dL (ref 0.4–1.5)
GFR: 135.18 mL/min (ref >60.00)
GLUCOSE: 214 mg/dL — AB (ref 70–99)
Potassium: 4.4 mEq/L (ref 3.5–5.1)
Sodium: 136 mEq/L (ref 135–145)

## 2013-07-26 LAB — PROTIME-INR
INR: 1.1 ratio — AB (ref 0.8–1.0)
Prothrombin Time: 12 s (ref 9.6–13.1)

## 2013-07-26 MED ORDER — NITROGLYCERIN 0.4 MG SL SUBL: 0.4000 mg | SUBLINGUAL_TABLET | SUBLINGUAL | Status: DC | PRN

## 2013-07-26 NOTE — Progress Notes (Signed)
Patient ID: [name], male   DOB: 1970/09/20, 43 y.o.   MRN: [id number]     Patient ID: [name] MRN: [id number] DOB/AGE: 1970/09/06 43 y.o.   Referring Physician Dr. Shawna Orleans   Reason for Consultation: chest pain  HPI: 43 y/o who has RF for CAd including HTN and DM.  He has had intermittent CP over the past few weeks.  He went to the ER for a visit after the first episode.  He also had left sided abdominal pain with diarrhea which lasted two days.   He had a negative w/u there in the ER.  CT abdomen was negative as well.  The abdominal pain has been intermitent and is not always associated with the chest pain.  CP worse with stretching.  Better with rest.  Not changed by walking.  Lifting soething heavy can bring on chest pain. No regular exercise.  Walks a lot on the job, also lifts a lot.  Works as a Training and development officer at [employer].  Father had MIx3 and CABG.  Unsure of age.  Siblings are healthy.   Patient feels that the symptoms are getting more frequent. He had an episode yesterday lasting 30 minutes. He just rested and it eventually went away.   Current Outpatient Prescriptions  Medication Sig Dispense Refill  . amLODipine (NORVASC) 5 MG tablet Take 1 tablet (5 mg total) by mouth daily.  90 tablet  1  . atorvastatin (LIPITOR) 20 MG tablet Take 1 tablet (20 mg total) by mouth daily.  90 tablet  1  . Insulin Detemir (LEVEMIR FLEXPEN) 100 UNIT/ML SOPN Inject 30 Units into the skin at bedtime.  9 mL  3  . insulin lispro (HUMALOG KWIKPEN) 100 UNIT/ML SOPN Use 10 units 10-15 minutes before your evening meal  9 mL  1  . Insulin Pen Needle (B-D ULTRAFINE III SHORT PEN) 31G X 8 MM MISC Use twice daily as directed  100 each  3  . lisinopril (PRINIVIL,ZESTRIL) 20 MG tablet Take 1 tablet (20 mg total) by mouth daily.  90 tablet  1  . metFORMIN (GLUCOPHAGE) 500 MG tablet Take 1 tablet (500 mg total) by mouth 2 (two) times daily with a meal.  180 tablet  1  . omeprazole (PRILOSEC) 20 MG capsule Take 1  capsule (20 mg total) by mouth daily.  90 capsule  0   No current facility-administered medications for this visit.   Past Medical History  Diagnosis Date  . Diabetes mellitus type II, uncontrolled   . Hypertension   . Tobacco use   . Hyperlipidemia     Family History  Problem Relation Age of Onset  . Cancer Mother   . Diabetes Father   . Hypertension Father     History   Social History  . Marital Status: Single    Spouse Name: N/A    Number of Children: N/A  . Years of Education: N/A   Occupational History  . 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 Smoking status: Current Every Day Smoker -- 1.00 packs/day for 20 years    Types: Cigarettes  . Smokeless tobacco: Never Used  . Alcohol Use: Yes     Comment: occ  . Drug Use: No  . Sexual Activity: Not Currently   Other Topics Concern  . Not on file   Social History Narrative  . No narrative on file    Past Surgical History  Procedure Laterality Date  . Spinal tap        (Not in a hospital admission)  Review of systems complete and found to be negative unless listed above .  No nausea, vomiting.  No fever chills, No focal weakness,  No palpitations.  Physical Exam: Filed Vitals:   07/26/13 0756  BP: 160/90  Pulse: 102    Weight: 176 lb (79.833 kg)  Physical exam:  [place]/AT EOMI No JVD, No carotid bruit RRR S1S2  No wheezing Soft. NT, nondistended No edema. 2+ right radial pulse No focal motor or sensory deficits Normal affect  Labs:   Lab Results  Component Value Date   WBC 11.9* 07/10/2013   HGB 15.4 07/10/2013   HCT 44.1 07/10/2013   MCV 86.5 07/10/2013   PLT 200 07/10/2013   No results found for this basename: NA, K, CL, CO2, BUN, CREATININE, CALCIUM, LABALBU, PROT, BILITOT, ALKPHOS, ALT, AST, GLUCOSE,  in the last 168 hours Lab Results  Component Value Date   TROPONINI <0.30 07/24/2012    Lab Results  Component Value Date   CHOL 101 08/04/2012   CHOL  106 04/19/2008   CHOL 104 01/19/2008   Lab Results  Component Value Date   HDL 40.40 08/04/2012   HDL 44 01/19/2008   Lab Results  Component Value Date   LDLCALC 46 08/04/2012   LDLCALC 46 01/19/2008   Lab Results  Component Value Date   TRIG 71.0 08/04/2012   TRIG 70 01/19/2008   Lab Results  Component Value Date   CHOLHDL 3 08/04/2012   CHOLHDL 2.4 Ratio 01/19/2008   No results found for this basename: LDLDIRECT      Radiology:Negative CXR, negative abdominal CT EKG: NSR, early repol 4/24; sinus tach, early repol 4/14  ASSESSMENT AND PLAN:  1) angina- some atypical features but it is getting more frequent, in a patient with multiple risk factors for heart disease including diabetes, hypertension and cigarette smoking. Given his diabetes, he may not have classic symptoms. Will prescribe sublingual nitroglycerin. Continue amlodipine.  Instructed him that if he has more discomfort that is not relieved with nitroglycerin, he should go to the emergency room. We'll long discussion regarding evaluation of his discomfort. We discussed stress testing versus cardiac catheterization. Since his symptoms are getting worse and episodes are getting longer, we decided on cardiac cath. His questions about the procedure were answered. The risks and benefits including possible stroke, MI, need for emergent surgery and bleeding were all mentioned. He is willing to proceed. If he had a negative stress test and his symptoms continued to get worse, we would likely pursue cardiac catheterization.  2) tobacco abuse: He stop smoking.  3) hypertension: His lisinopril was increased a week ago. We may have to increase his amlodipine as well. I stressed the importance of long-term blood pressure control.  4) he'll also need aggressive diabetes control with a target hemoglobin A1c of less than 7.  Erectile dysfunction: He inquired if he would be a candidate for Viagra.  If his cardiac evaluation is negative and he  does not need any sustained nitrate use, he could use Viagra in the future.  Signed:   Mina Marble, MD, [facility] 07/26/2013, 8:15 AM

## 2013-07-26 NOTE — Patient Instructions (Addendum)
Your physician recommends that you return for lab work today for cbc with diff, bmet and pt/inr.  Your physician has requested that you have a cardiac catheterization. Cardiac catheterization is used to diagnose and/or treat various heart conditions. Doctors may recommend this procedure for a number of different reasons. The most common reason is to evaluate chest pain. Chest pain can be a symptom of coronary artery disease (CAD), and cardiac catheterization can show whether plaque is narrowing or blocking your heart's arteries. This procedure is also used to evaluate the valves, as well as measure the blood flow and oxygen levels in different parts of your heart. For further information please visit [email or web address]. Please follow instruction sheet, as given.   Your physician has recommended you make the following change in your medication:  1. Start Nitroglycerin 0.4 mg SL as needed for chest pain.

## 2013-07-31 ENCOUNTER — Ambulatory Visit (HOSPITAL_COMMUNITY)
Admission: RE | Admit: 2013-07-31 | Discharge: 2013-07-31 | Disposition: A | Discharge status: Home / Self Care | Payer: Self-pay | Source: Ambulatory Visit | Attending: Interventional Cardiology | Admitting: Interventional Cardiology

## 2013-07-31 ENCOUNTER — Encounter (HOSPITAL_COMMUNITY)
Admission: RE | Disposition: A | Discharge status: Home / Self Care | Payer: Self-pay | Source: Ambulatory Visit | Attending: Interventional Cardiology

## 2013-07-31 ENCOUNTER — Encounter (HOSPITAL_COMMUNITY): Payer: Self-pay | Admitting: Interventional Cardiology

## 2013-07-31 DIAGNOSIS — I10 Essential (primary) hypertension: Secondary | ICD-10-CM | POA: Insufficient documentation

## 2013-07-31 DIAGNOSIS — I2510 Atherosclerotic heart disease of native coronary artery without angina pectoris: Secondary | ICD-10-CM

## 2013-07-31 DIAGNOSIS — I209 Angina pectoris, unspecified: Secondary | ICD-10-CM

## 2013-07-31 DIAGNOSIS — Z794 Long term (current) use of insulin: Secondary | ICD-10-CM | POA: Insufficient documentation

## 2013-07-31 DIAGNOSIS — E785 Hyperlipidemia, unspecified: Secondary | ICD-10-CM | POA: Insufficient documentation

## 2013-07-31 DIAGNOSIS — F17200 Nicotine dependence, unspecified, uncomplicated: Secondary | ICD-10-CM | POA: Insufficient documentation

## 2013-07-31 DIAGNOSIS — IMO0001 Reserved for inherently not codable concepts without codable children: Secondary | ICD-10-CM | POA: Insufficient documentation

## 2013-07-31 DIAGNOSIS — E1165 Type 2 diabetes mellitus with hyperglycemia: Secondary | ICD-10-CM

## 2013-07-31 HISTORY — DX: Angina pectoris, unspecified: I20.9

## 2013-07-31 HISTORY — PX: LEFT HEART CATHETERIZATION WITH CORONARY ANGIOGRAM: SHX5451

## 2013-07-31 LAB — GLUCOSE, CAPILLARY
GLUCOSE-CAPILLARY: 234 mg/dL — AB (ref 70–99)
Glucose-Capillary: 264 mg/dL — ABNORMAL HIGH (ref 70–99)

## 2013-07-31 SURGERY — LEFT HEART CATHETERIZATION WITH CORONARY ANGIOGRAM
Anesthesia: LOCAL

## 2013-07-31 MED ORDER — HEPARIN SODIUM (PORCINE) 1000 UNIT/ML IJ SOLN
INTRAMUSCULAR | Status: AC
  Filled 2013-07-31: qty 1

## 2013-07-31 MED ORDER — ASPIRIN 81 MG PO CHEW: 81.0000 mg | CHEWABLE_TABLET | Freq: Every day | ORAL | Status: DC

## 2013-07-31 MED ORDER — SODIUM CHLORIDE 0.9 % IV SOLN: 1.0000 mL/kg/h | INTRAVENOUS | Status: AC

## 2013-07-31 MED ORDER — SODIUM CHLORIDE 0.9 % IJ SOLN: 3.0000 mL | INTRAMUSCULAR | Status: DC | PRN

## 2013-07-31 MED ORDER — SODIUM CHLORIDE 0.9 % IV SOLN
INTRAVENOUS | Status: DC
  Administered 2013-07-31: 1000 mL via INTRAVENOUS

## 2013-07-31 MED ORDER — MIDAZOLAM HCL 2 MG/2ML IJ SOLN
INTRAMUSCULAR | Status: AC
  Filled 2013-07-31: qty 2

## 2013-07-31 MED ORDER — SODIUM CHLORIDE 0.9 % IV SOLN: 250.0000 mL | INTRAVENOUS | Status: DC | PRN

## 2013-07-31 MED ORDER — HEPARIN (PORCINE) IN NACL 2-0.9 UNIT/ML-% IJ SOLN
INTRAMUSCULAR | Status: AC
  Filled 2013-07-31: qty 1000

## 2013-07-31 MED ORDER — LIDOCAINE HCL (PF) 1 % IJ SOLN
INTRAMUSCULAR | Status: AC
  Filled 2013-07-31: qty 30

## 2013-07-31 MED ORDER — METFORMIN HCL 500 MG PO TABS: 500.0000 mg | ORAL_TABLET | Freq: Two times a day (BID) | ORAL | Status: DC

## 2013-07-31 MED ORDER — FENTANYL CITRATE 0.05 MG/ML IJ SOLN
INTRAMUSCULAR | Status: AC
  Filled 2013-07-31: qty 2

## 2013-07-31 MED ORDER — SODIUM CHLORIDE 0.9 % IJ SOLN: 3.0000 mL | Freq: Two times a day (BID) | INTRAMUSCULAR | Status: DC

## 2013-07-31 MED ORDER — VERAPAMIL HCL 2.5 MG/ML IV SOLN
INTRAVENOUS | Status: AC
  Filled 2013-07-31: qty 2

## 2013-07-31 MED ORDER — ASPIRIN 81 MG PO CHEW
81.0000 mg | CHEWABLE_TABLET | ORAL | Status: AC
  Administered 2013-07-31: 81 mg via ORAL
  Filled 2013-07-31: qty 1

## 2013-07-31 MED ORDER — NITROGLYCERIN 0.2 MG/ML ON CALL CATH LAB
INTRAVENOUS | Status: AC
  Filled 2013-07-31: qty 1

## 2013-07-31 NOTE — H&P (View-Only) (Signed)
Patient ID: [name], male   DOB: 1970-11-17, 43 y.o.   MRN: [id number]     Patient ID: [name] MRN: [id number] DOB/AGE: [birth year] 43 y.o.   Referring Physician Dr. Shawna Orleans   Reason for Consultation: chest pain  HPI: 43 y/o who has RF for CAd including HTN and DM.  He has had intermittent CP over the past few weeks.  He went to the ER for a visit after the first episode.  He also had left sided abdominal pain with diarrhea which lasted two days.   He had a negative w/u there in the ER.  CT abdomen was negative as well.  The abdominal pain has been intermitent and is not always associated with the chest pain.  CP worse with stretching.  Better with rest.  Not changed by walking.  Lifting soething heavy can bring on chest pain. No regular exercise.  Walks a lot on the job, also lifts a lot.  Works as a Training and development officer at [employer].  Father had MIx3 and CABG.  Unsure of age.  Siblings are healthy.   Patient feels that the symptoms are getting more frequent. He had an episode yesterday lasting 30 minutes. He just rested and it eventually went away.   Current Outpatient Prescriptions  Medication Sig Dispense Refill  . amLODipine (NORVASC) 5 MG tablet Take 1 tablet (5 mg total) by mouth daily.  90 tablet  1  . atorvastatin (LIPITOR) 20 MG tablet Take 1 tablet (20 mg total) by mouth daily.  90 tablet  1  . Insulin Detemir (LEVEMIR FLEXPEN) 100 UNIT/ML SOPN Inject 30 Units into the skin at bedtime.  9 mL  3  . insulin lispro (HUMALOG KWIKPEN) 100 UNIT/ML SOPN Use 10 units 10-15 minutes before your evening meal  9 mL  1  . Insulin Pen Needle (B-D ULTRAFINE III SHORT PEN) 31G X 8 MM MISC Use twice daily as directed  100 each  3  . lisinopril (PRINIVIL,ZESTRIL) 20 MG tablet Take 1 tablet (20 mg total) by mouth daily.  90 tablet  1  . metFORMIN (GLUCOPHAGE) 500 MG tablet Take 1 tablet (500 mg total) by mouth 2 (two) times daily with a meal.  180 tablet  1  . omeprazole (PRILOSEC) 20 MG capsule Take 1  capsule (20 mg total) by mouth daily.  90 capsule  0   No current facility-administered medications for this visit.   Past Medical History  Diagnosis Date  . Diabetes mellitus type II, uncontrolled   . Hypertension   . Tobacco use   . Hyperlipidemia     Family History  Problem Relation Age of Onset  . Cancer Mother   . Diabetes Father   . Hypertension Father     History   Social History  . Marital Status: Single    Spouse Name: N/A    Number of Children: N/A  . Years of Education: N/A   Occupational History  . 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 Smoking status: Current Every Day Smoker -- 1.00 packs/day for 20 years    Types: Cigarettes  . Smokeless tobacco: Never Used  . Alcohol Use: Yes     Comment: occ  . Drug Use: No  . Sexual Activity: Not Currently   Other Topics Concern  . Not on file   Social History Narrative  . No narrative on file    Past Surgical History  Procedure Laterality Date  . Spinal tap        (Not in a hospital admission)  Review of systems complete and found to be negative unless listed above .  No nausea, vomiting.  No fever chills, No focal weakness,  No palpitations.  Physical Exam: Filed Vitals:   07/26/13 0756  BP: 160/90  Pulse: 102    Weight: 176 lb (79.833 kg)  Physical exam:  [place]/AT EOMI No JVD, No carotid bruit RRR S1S2  No wheezing Soft. NT, nondistended No edema. 2+ right radial pulse No focal motor or sensory deficits Normal affect  Labs:   Lab Results  Component Value Date   WBC 11.9* 07/10/2013   HGB 15.4 07/10/2013   HCT 44.1 07/10/2013   MCV 86.5 07/10/2013   PLT 200 07/10/2013   No results found for this basename: NA, K, CL, CO2, BUN, CREATININE, CALCIUM, LABALBU, PROT, BILITOT, ALKPHOS, ALT, AST, GLUCOSE,  in the last 168 hours Lab Results  Component Value Date   TROPONINI <0.30 07/24/2012    Lab Results  Component Value Date   CHOL 101 08/04/2012   CHOL  106 04/19/2008   CHOL 104 01/19/2008   Lab Results  Component Value Date   HDL 40.40 08/04/2012   HDL 44 01/19/2008   Lab Results  Component Value Date   LDLCALC 46 08/04/2012   LDLCALC 46 01/19/2008   Lab Results  Component Value Date   TRIG 71.0 08/04/2012   TRIG 70 01/19/2008   Lab Results  Component Value Date   CHOLHDL 3 08/04/2012   CHOLHDL 2.4 Ratio 01/19/2008   No results found for this basename: LDLDIRECT      Radiology:Negative CXR, negative abdominal CT EKG: NSR, early repol 4/24; sinus tach, early repol 4/14  ASSESSMENT AND PLAN:  1) angina- some atypical features but it is getting more frequent, in a patient with multiple risk factors for heart disease including diabetes, hypertension and cigarette smoking. Given his diabetes, he may not have classic symptoms. Will prescribe sublingual nitroglycerin. Continue amlodipine.  Instructed him that if he has more discomfort that is not relieved with nitroglycerin, he should go to the emergency room. We'll long discussion regarding evaluation of his discomfort. We discussed stress testing versus cardiac catheterization. Since his symptoms are getting worse and episodes are getting longer, we decided on cardiac cath. His questions about the procedure were answered. The risks and benefits including possible stroke, MI, need for emergent surgery and bleeding were all mentioned. He is willing to proceed. If he had a negative stress test and his symptoms continued to get worse, we would likely pursue cardiac catheterization.  2) tobacco abuse: He stop smoking.  3) hypertension: His lisinopril was increased a week ago. We may have to increase his amlodipine as well. I stressed the importance of long-term blood pressure control.  4) he'll also need aggressive diabetes control with a target hemoglobin A1c of less than 7.  Erectile dysfunction: He inquired if he would be a candidate for Viagra.  If his cardiac evaluation is negative and he  does not need any sustained nitrate use, he could use Viagra in the future.  Signed:   Mina Marble, MD, [facility] 07/26/2013, 8:15 AM

## 2013-07-31 NOTE — Interval H&P Note (Signed)
Cath Lab Visit (complete for each Cath Lab visit)  Clinical Evaluation Leading to the Procedure:   ACS: no  Non-ACS:    Anginal Classification: CCS III  Anti-ischemic medical therapy: Minimal Therapy (1 class of medications)  Non-Invasive Test Results: No non-invasive testing performed  Prior CABG: No previous CABG      History and Physical Interval Note:  07/31/2013 7:53 AM  [name]  has presented today for surgery, with the diagnosis of Chest Pain  The various methods of treatment have been discussed with the patient and family. After consideration of risks, benefits and other options for treatment, the patient has consented to  Procedure(s): LEFT HEART CATHETERIZATION WITH CORONARY ANGIOGRAM (N/A) as a surgical intervention .  The patient's history has been reviewed, patient examined, no change in status, stable for surgery.  I have reviewed the patient's chart and labs.  Questions were answered to the patient's satisfaction.     Jettie Booze

## 2013-07-31 NOTE — Discharge Instructions (Signed)

## 2013-07-31 NOTE — CV Procedure (Signed)
       PROCEDURE:  Left heart catheterization with selective coronary angiography, left ventriculogram.  INDICATIONS:  Recurrent chest pain  The risks, benefits, and details of the procedure were explained to the patient.  The patient verbalized understanding and wanted to proceed.  Informed written consent was obtained.  PROCEDURE TECHNIQUE:  After Xylocaine anesthesia a 24F slender sheath was placed in the right radial artery with a single anterior needle wall stick.  IV heparin was given. Right coronary angiography was done using a Judkins R4 guide catheter.  Left coronary angiography was done using a Judkins L3.5 guide catheter.  Left ventriculography was done using a pigtail catheter.  A TR band was used for hemostasis.   CONTRAST:  Total of 85 cc.  COMPLICATIONS:  None.    HEMODYNAMICS:  Aortic pressure was 117/74; LV pressure was 116/9; LVEDP 12.  There was no gradient between the left ventricle and aorta.    ANGIOGRAPHIC DATA:   The left main coronary artery is widely patent.  The left anterior descending artery is a large vessel which wraps around the apex. In the proximal LAD, there is an area of smooth, mild atherosclerosis, but to 30% in the cranial views. There are multiple small diagonal vessels which are widely patent.  The left circumflex artery is a large vessel. The first obtuse marginal is small but patent. The second obtuse marginal is large and branches across the lateral wall. The circumflex system appears angiographically normal.  The right coronary artery is a large dominant vessel. There is no angiographically apparent coronary artery disease. The PDA and posterolateral artery are medium size and widely patent.    LEFT VENTRICULOGRAM:  Left ventricular angiogram was done in the 30 RAO projection and revealed normal left ventricular wall motion and systolic function with an estimated ejection fraction of 60%.  LVEDP was 12  mmHg.  IMPRESSIONS:  1. Normal left  main coronary artery. 2.  Mild disease in the proximal left anterior descending artery without significant obstructive disease.  Patent  branches. 3. Normal left circumflex artery and its branches. 4. Normal right coronary artery. 5. Normal left ventricular systolic function.  LVEDP 12  mmHg.  Ejection fraction 60 %.  RECOMMENDATION:  He does have evidence of mild atherosclerosis in the proximal LAD. This is not hemodynamically significant but it stresses the importance of risk factor modification. I talked to him extensively about stopping smoking and diabetes control. He would need to change these risk factors to avoid worsening of his atherosclerosis in the future.  He will followup with me in the office. He will also follow up with primary care.  He will not be taking sustained release nitrates.  He will be ok to take Viagra per his request.

## 2013-07-31 NOTE — Progress Notes (Signed)
Discharge instructions dicussed and reviewed with pt.  Pt verbalize understanding.  Will continue to monitor

## 2013-08-17 ENCOUNTER — Ambulatory Visit (INDEPENDENT_AMBULATORY_CARE_PROVIDER_SITE_OTHER): Payer: Self-pay | Admitting: Interventional Cardiology

## 2013-08-17 ENCOUNTER — Encounter: Payer: Self-pay | Admitting: Interventional Cardiology

## 2013-08-17 VITALS — BP 120/80 | HR 118 | Ht 66.0 in | Wt 176.0 lb

## 2013-08-17 DIAGNOSIS — I498 Other specified cardiac arrhythmias: Secondary | ICD-10-CM

## 2013-08-17 DIAGNOSIS — R000 Tachycardia, unspecified: Secondary | ICD-10-CM

## 2013-08-17 DIAGNOSIS — I2510 Atherosclerotic heart disease of native coronary artery without angina pectoris: Secondary | ICD-10-CM | POA: Insufficient documentation

## 2013-08-17 DIAGNOSIS — N529 Male erectile dysfunction, unspecified: Secondary | ICD-10-CM

## 2013-08-17 DIAGNOSIS — F17200 Nicotine dependence, unspecified, uncomplicated: Secondary | ICD-10-CM

## 2013-08-17 MED ORDER — SILDENAFIL CITRATE 100 MG PO TABS: 50.0000 mg | ORAL_TABLET | Freq: Every day | ORAL | Status: DC | PRN

## 2013-08-17 NOTE — Progress Notes (Signed)
Patient ID: [name], male   DOB: 1970-09-25, 43 y.o.   MRN: [id number]    [address] [place], [place]  [address] Phone: [phone] Fax:  [phone]  Date:  08/17/2013   ID:  [name], DOB 03-03-1971, MRN [id number]  PCP:  Drema Pry, DO      History of Present Illness: [name] is a 43 y.o. male risk factors for coronary artery disease who had a cardiac cath in May 2015. This revealed mild, proximal LAD disease. Medical therapy was stressed to him. He has not stopped smoking. His sugar is under better control. He is looking to quit smoking. He does follow his blood pressure. He has occasional sharp chest pains which are not related to exertion. These are unchanged from before the catheterization.   Wt Readings from Last 3 Encounters:  08/17/13 176 lb (79.833 kg)  07/31/13 176 lb (79.833 kg)  07/31/13 176 lb (79.833 kg)     Past Medical History  Diagnosis Date  . Diabetes mellitus type II, uncontrolled   . Hypertension   . Tobacco use   . Hyperlipidemia   . Other and unspecified angina pectoris     Current Outpatient Prescriptions  Medication Sig Dispense Refill  . amLODipine (NORVASC) 5 MG tablet Take 1 tablet (5 mg total) by mouth daily.  90 tablet  1  . atorvastatin (LIPITOR) 20 MG tablet Take 1 tablet (20 mg total) by mouth daily.  90 tablet  1  . Cyanocobalamin (VITAMIN B-12 PO) Take 1 capsule by mouth daily.      . Insulin Detemir (LEVEMIR FLEXPEN) 100 UNIT/ML SOPN Inject 30 Units into the skin at bedtime.  9 mL  3  . insulin lispro (HUMALOG KWIKPEN) 100 UNIT/ML SOPN Use 10 units 10-15 minutes before your evening meal  9 mL  1  . Insulin Pen Needle (B-D ULTRAFINE III SHORT PEN) 31G X 8 MM MISC Use twice daily as directed  100 each  3  . lisinopril (PRINIVIL,ZESTRIL) 20 MG tablet Take 1 tablet (20 mg total) by mouth daily.  90 tablet  1  . metFORMIN (GLUCOPHAGE) 500 MG tablet Take 1 tablet (500 mg total) by mouth 2 (two) times  daily with a meal.  180 tablet  1  . nitroGLYCERIN (NITROSTAT) 0.4 MG SL tablet Place 1 tablet (0.4 mg total) under the tongue every 5 (five) minutes as needed for chest pain.  25 tablet  5  . omeprazole (PRILOSEC) 20 MG capsule Take 1 capsule (20 mg total) by mouth daily.  90 capsule  0  . sildenafil (VIAGRA) 100 MG tablet Take 0.5 tablets (50 mg total) by mouth daily as needed for erectile dysfunction.  5 tablet  11   No current facility-administered medications for this visit.    Allergies:    Allergies  Allergen Reactions  . Bee Venom Swelling  . Penicillins Hives    Social History:  The patient  reports that he has been smoking Cigarettes.  He has a 20 pack-year smoking history. He has never used smokeless tobacco. He reports that he drinks alcohol. He reports that he does not use illicit drugs.   Family History:  The patient's family history includes Cancer in his mother; Diabetes in his father; Hypertension in his father.   ROS:  Please see the history of present illness.  No nausea, vomiting.  No fevers, chills.  No focal weakness.  No dysuria.   All other systems  reviewed and negative.   PHYSICAL EXAM: VS:  BP 120/80  Pulse 118  Ht 5\' 6"  (1.676 m)  Wt 176 lb (79.833 kg)  BMI 28.42 kg/m2 Well nourished, well developed, in no acute distress HEENT: normal Neck: no JVD, no carotid bruits Cardiac:  normal S1, S2; RRR;  Lungs:  clear to auscultation bilaterally, no wheezing, rhonchi or rales Abd: soft, nontender, no hepatomegaly Ext: no edema Skin: warm and dry Neuro:   no focal abnormalities noted  EKG:  Sinus tachycardia     ASSESSMENT AND PLAN:  1. CAD: Mild, nonobstructive. He given his age, I think he needs aggressive risk factor modification. We stressed the importance of smoking cessation and diabetes control. He will call the [place] quit line to help with stopping smoking. He'll also followup with his primary care Dr.  He was counseled extensively today in  the office by myself as well as the nurse practitioner student. 2. Erectile dysfunction: He wants to try Viagra. This is fine since he is not using nitrates. 3. Chest pain: Atypical: Not from coronary disease. He had a negative d-dimer a month ago. 4. Tobacco abuse: He will decide on what tool he would use to help stop smoking. He does not think he can stop on his own.  Signed, Mina Marble, MD, [facility] 08/17/2013 6:33 PM

## 2013-08-17 NOTE — Patient Instructions (Signed)
Rx sent in for Viagra 100 mg take 1/2 tablet as needed.   Your physician recommends that you continue on your current medications as directed. Please refer to the Current Medication list given to you today.  Your physician wants you to follow-up in: 1 year with Dr. Irish Lack. You will receive a reminder letter in the mail two months in advance. If you don't receive a letter, please call our office to schedule the follow-up appointment.  Smoking Cessation Quitting smoking is important to your health and has many advantages. However, it is not always easy to quit since nicotine is a very addictive drug. Often times, people try 3 times or more before being able to quit. This document explains the best ways for you to prepare to quit smoking. Quitting takes hard work and a lot of effort, but you can do it. ADVANTAGES OF QUITTING SMOKING  You will live longer, feel better, and live better.  Your body will feel the impact of quitting smoking almost immediately.  Within 20 minutes, blood pressure decreases. Your pulse returns to its normal level.  After 8 hours, carbon monoxide levels in the blood return to normal. Your oxygen level increases.  After 24 hours, the chance of having a heart attack starts to decrease. Your breath, hair, and body stop smelling like smoke.  After 48 hours, damaged nerve endings begin to recover. Your sense of taste and smell improve.  After 72 hours, the body is virtually free of nicotine. Your bronchial tubes relax and breathing becomes easier.  After 2 to 12 weeks, lungs can hold more air. Exercise becomes easier and circulation improves.  The risk of having a heart attack, stroke, cancer, or lung disease is greatly reduced.  After 1 year, the risk of coronary heart disease is cut in half.  After 5 years, the risk of stroke falls to the same as a nonsmoker.  After 10 years, the risk of lung cancer is cut in half and the risk of other cancers decreases  significantly.  After 15 years, the risk of coronary heart disease drops, usually to the level of a nonsmoker.  If you are pregnant, quitting smoking will improve your chances of having a healthy baby.  The people you live with, especially any children, will be healthier.  You will have extra money to spend on things other than cigarettes. QUESTIONS TO THINK ABOUT BEFORE ATTEMPTING TO QUIT You may want to talk about your answers with your caregiver.  Why do you want to quit?  If you tried to quit in the past, what helped and what did not?  What will be the most difficult situations for you after you quit? How will you plan to handle them?  Who can help you through the tough times? Your family? Friends? A caregiver?  What pleasures do you get from smoking? What ways can you still get pleasure if you quit? Here are some questions to ask your caregiver:  How can you help me to be successful at quitting?  What medicine do you think would be best for me and how should I take it?  What should I do if I need more help?  What is smoking withdrawal like? How can I get information on withdrawal? GET READY  Set a quit date.  Change your environment by getting rid of all cigarettes, ashtrays, matches, and lighters in your home, car, or work. Do not let people smoke in your home.  Review your past attempts to quit.  Think about what worked and what did not. GET SUPPORT AND ENCOURAGEMENT You have a better chance of being successful if you have help. You can get support in many ways.  Tell your family, friends, and co-workers that you are going to quit and need their support. Ask them not to smoke around you.  Get individual, group, or telephone counseling and support. Programs are available at [employer] and health centers. Call your local health department for information about programs in your area.  Spiritual beliefs and practices may help some smokers quit.  Download a "quit  meter" on your computer to keep track of quit statistics, such as how long you have gone without smoking, cigarettes not smoked, and money saved.  Get a self-help book about quitting smoking and staying off of tobacco. Boyle yourself from urges to smoke. Talk to someone, go for a walk, or occupy your time with a task.  Change your normal routine. Take a different route to work. Drink tea instead of coffee. Eat breakfast in a different place.  Reduce your stress. Take a hot bath, exercise, or read a book.  Plan something enjoyable to do every day. Reward yourself for not smoking.  Explore interactive web-based programs that specialize in helping you quit. GET MEDICINE AND USE IT CORRECTLY Medicines can help you stop smoking and decrease the urge to smoke. Combining medicine with the above behavioral methods and support can greatly increase your chances of successfully quitting smoking.  Nicotine replacement therapy helps deliver nicotine to your body without the negative effects and risks of smoking. Nicotine replacement therapy includes nicotine gum, lozenges, inhalers, nasal sprays, and skin patches. Some may be available over-the-counter and others require a prescription.  Antidepressant medicine helps people abstain from smoking, but how this works is unknown. This medicine is available by prescription.  Nicotinic receptor partial agonist medicine simulates the effect of nicotine in your brain. This medicine is available by prescription. Ask your caregiver for advice about which medicines to use and how to use them based on your health history. Your caregiver will tell you what side effects to look out for if you choose to be on a medicine or therapy. Carefully read the information on the package. Do not use any other product containing nicotine while using a nicotine replacement product.  RELAPSE OR DIFFICULT SITUATIONS Most relapses occur within the  first 3 months after quitting. Do not be discouraged if you start smoking again. Remember, most people try several times before finally quitting. You may have symptoms of withdrawal because your body is used to nicotine. You may crave cigarettes, be irritable, feel very hungry, cough often, get headaches, or have difficulty concentrating. The withdrawal symptoms are only temporary. They are strongest when you first quit, but they will go away within 10 14 days. To reduce the chances of relapse, try to:  Avoid drinking alcohol. Drinking lowers your chances of successfully quitting.  Reduce the amount of caffeine you consume. Once you quit smoking, the amount of caffeine in your body increases and can give you symptoms, such as a rapid heartbeat, sweating, and anxiety.  Avoid smokers because they can make you want to smoke.  Do not let weight gain distract you. Many smokers will gain weight when they quit, usually less than 10 pounds. Eat a healthy diet and stay active. You can always lose the weight gained after you quit.  Find ways to improve your mood other than smoking.  FOR MORE INFORMATION  www.smokefree.gov  Document Released: 03/09/2001 Document Revised: 09/14/2011 Document Reviewed: 06/24/2011 [facility] Patient Information 2014 [place], [place].    Visit [email or web address]  Call [phone]

## 2013-08-27 ENCOUNTER — Encounter: Payer: Self-pay | Admitting: Internal Medicine

## 2013-08-27 ENCOUNTER — Ambulatory Visit (INDEPENDENT_AMBULATORY_CARE_PROVIDER_SITE_OTHER): Payer: Self-pay | Admitting: Internal Medicine

## 2013-08-27 VITALS — BP 124/84 | HR 96 | Temp 99.2°F | Ht 66.0 in | Wt 179.0 lb

## 2013-08-27 DIAGNOSIS — E1149 Type 2 diabetes mellitus with other diabetic neurological complication: Secondary | ICD-10-CM

## 2013-08-27 DIAGNOSIS — IMO0002 Reserved for concepts with insufficient information to code with codable children: Secondary | ICD-10-CM

## 2013-08-27 DIAGNOSIS — E1165 Type 2 diabetes mellitus with hyperglycemia: Secondary | ICD-10-CM

## 2013-08-27 DIAGNOSIS — N529 Male erectile dysfunction, unspecified: Secondary | ICD-10-CM

## 2013-08-27 DIAGNOSIS — I2510 Atherosclerotic heart disease of native coronary artery without angina pectoris: Secondary | ICD-10-CM

## 2013-08-27 NOTE — Assessment & Plan Note (Signed)
Samples of basal and meal time insulin provided.  A1c before next OV.

## 2013-08-27 NOTE — Progress Notes (Signed)
Subjective:    Patient ID: [name], male    DOB: 02/28/1971, 43 y.o.   MRN: [id number]  HPI  43 year old [place] American male for followup. Interval medical history-patient seen by cardiology regarding chest pain. He underwent cardiac cath in May of 2015. It showed mild, proximal LAD disease. Cardiology recommends medical treatment.  Type 2 diabetes-patient reports using his insulin as directed. He is not checking his blood sugars on regular basis. He is currently unemployed.  Tobacco use - patient unable to quit smoking.  He complains of ED and he would like to try Cialis.  Review of Systems See HPI     Past Medical History  Diagnosis Date  . Diabetes mellitus type II, uncontrolled   . Hypertension   . Tobacco use   . Hyperlipidemia   . Other and unspecified angina pectoris     History   Social History  . Marital Status: Single    Spouse Name: N/A    Number of Children: N/A  . Years of Education: N/A   Occupational History  . 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 Smoking status: Current Every Day Smoker -- 1.00 packs/day for 20 years    Types: Cigarettes  . Smokeless tobacco: Never Used  . Alcohol Use: Yes     Comment: occ  . Drug Use: No  . Sexual Activity: Not Currently   Other Topics Concern  . Not on file   Social History Narrative  . No narrative on file    Past Surgical History  Procedure Laterality Date  . Spinal tap      Family History  Problem Relation Age of Onset  . Cancer Mother   . Diabetes Father   . Hypertension Father     Allergies  Allergen Reactions  . Bee Venom Swelling  . Penicillins Hives    Current Outpatient Prescriptions on File Prior to Visit  Medication Sig Dispense Refill  . amLODipine (NORVASC) 5 MG tablet Take 1 tablet (5 mg total) by mouth daily.  90 tablet  1  . atorvastatin (LIPITOR) 20 MG tablet Take 1 tablet (20 mg total) by mouth daily.  90 tablet  1    . Insulin Detemir (LEVEMIR FLEXPEN) 100 UNIT/ML SOPN Inject 30 Units into the skin at bedtime.  9 mL  3  . insulin lispro (HUMALOG KWIKPEN) 100 UNIT/ML SOPN Use 10 units 10-15 minutes before your evening meal  9 mL  1  . Insulin Pen Needle (B-D ULTRAFINE III SHORT PEN) 31G X 8 MM MISC Use twice daily as directed  100 each  3  . lisinopril (PRINIVIL,ZESTRIL) 20 MG tablet Take 1 tablet (20 mg total) by mouth daily.  90 tablet  1  . metFORMIN (GLUCOPHAGE) 500 MG tablet Take 1 tablet (500 mg total) by mouth 2 (two) times daily with a meal.  180 tablet  1  . Cyanocobalamin (VITAMIN B-12 PO) Take 1 capsule by mouth daily.      . nitroGLYCERIN (NITROSTAT) 0.4 MG SL tablet Place 1 tablet (0.4 mg total) under the tongue every 5 (five) minutes as needed for chest pain.  25 tablet  5  . omeprazole (PRILOSEC) 20 MG capsule Take 1 capsule (20 mg total) by mouth daily.  90 capsule  0   No current facility-administered medications on file prior to visit.    BP 124/84  Pulse 96  Temp(Src) 99.2 F (37.3 C) (  Oral)  Ht 5\' 6"  (1.676 m)  Wt 179 lb (81.194 kg)  BMI 28.91 kg/m2    Objective:   Physical Exam  Constitutional: He is oriented to person, place, and time. He appears well-developed and well-nourished.  HENT:  Head: Normocephalic and atraumatic.  Cardiovascular: Regular rhythm and normal heart sounds.   No murmur heard. tachycardic  Pulmonary/Chest: Effort normal and breath sounds normal. He has no wheezes.  Neurological: He is alert and oriented to person, place, and time. No cranial nerve deficit.        Assessment & Plan:

## 2013-08-27 NOTE — Patient Instructions (Addendum)
Please complete the following lab tests before your next follow up appointment:  BMET, A1c - 250.02 

## 2013-08-27 NOTE — Assessment & Plan Note (Signed)
Patient requests trial of Cialis.  Rx for 30 day trial of 5 mg provided.

## 2013-08-27 NOTE — Progress Notes (Signed)
Pre visit review using our clinic review tool, if applicable. No additional management support is needed unless otherwise documented below in the visit note. 

## 2013-08-27 NOTE — Assessment & Plan Note (Signed)
Continue medical therapy.  I stressed importance of smoking cessation.

## 2013-10-26 ENCOUNTER — Telehealth: Payer: Self-pay | Admitting: *Deleted

## 2013-10-26 DIAGNOSIS — IMO0002 Reserved for concepts with insufficient information to code with codable children: Secondary | ICD-10-CM

## 2013-10-26 DIAGNOSIS — E1149 Type 2 diabetes mellitus with other diabetic neurological complication: Secondary | ICD-10-CM

## 2013-10-26 DIAGNOSIS — E1165 Type 2 diabetes mellitus with hyperglycemia: Principal | ICD-10-CM

## 2013-10-26 NOTE — Telephone Encounter (Signed)
Left message on machine for patient to schedule a lab appointment for cholesterol Lipid Diabetic bundle

## 2013-12-02 ENCOUNTER — Emergency Department (HOSPITAL_COMMUNITY)
Admission: EM | Admit: 2013-12-02 | Discharge: 2013-12-02 | Disposition: A | Discharge status: Home / Self Care | Payer: Self-pay | Attending: Emergency Medicine | Admitting: Emergency Medicine

## 2013-12-02 ENCOUNTER — Emergency Department (HOSPITAL_COMMUNITY): Payer: Self-pay

## 2013-12-02 ENCOUNTER — Encounter (HOSPITAL_COMMUNITY): Payer: Self-pay | Admitting: Emergency Medicine

## 2013-12-02 DIAGNOSIS — Z79899 Other long term (current) drug therapy: Secondary | ICD-10-CM | POA: Insufficient documentation

## 2013-12-02 DIAGNOSIS — E08621 Diabetes mellitus due to underlying condition with foot ulcer: Secondary | ICD-10-CM

## 2013-12-02 DIAGNOSIS — Z760 Encounter for issue of repeat prescription: Secondary | ICD-10-CM | POA: Insufficient documentation

## 2013-12-02 DIAGNOSIS — Z880 Allergy status to penicillin: Secondary | ICD-10-CM | POA: Insufficient documentation

## 2013-12-02 DIAGNOSIS — L97509 Non-pressure chronic ulcer of other part of unspecified foot with unspecified severity: Secondary | ICD-10-CM | POA: Insufficient documentation

## 2013-12-02 DIAGNOSIS — Z794 Long term (current) use of insulin: Secondary | ICD-10-CM | POA: Insufficient documentation

## 2013-12-02 DIAGNOSIS — I10 Essential (primary) hypertension: Secondary | ICD-10-CM | POA: Insufficient documentation

## 2013-12-02 DIAGNOSIS — E1169 Type 2 diabetes mellitus with other specified complication: Secondary | ICD-10-CM

## 2013-12-02 DIAGNOSIS — L97519 Non-pressure chronic ulcer of other part of right foot with unspecified severity: Secondary | ICD-10-CM

## 2013-12-02 DIAGNOSIS — IMO0002 Reserved for concepts with insufficient information to code with codable children: Secondary | ICD-10-CM | POA: Insufficient documentation

## 2013-12-02 DIAGNOSIS — I209 Angina pectoris, unspecified: Secondary | ICD-10-CM | POA: Insufficient documentation

## 2013-12-02 DIAGNOSIS — F17200 Nicotine dependence, unspecified, uncomplicated: Secondary | ICD-10-CM | POA: Insufficient documentation

## 2013-12-02 DIAGNOSIS — E1165 Type 2 diabetes mellitus with hyperglycemia: Secondary | ICD-10-CM | POA: Insufficient documentation

## 2013-12-02 DIAGNOSIS — E785 Hyperlipidemia, unspecified: Secondary | ICD-10-CM | POA: Insufficient documentation

## 2013-12-02 LAB — CBG MONITORING, ED: Glucose-Capillary: 249 mg/dL — ABNORMAL HIGH (ref 70–99)

## 2013-12-02 MED ORDER — LISINOPRIL 20 MG PO TABS: 20.0000 mg | ORAL_TABLET | Freq: Every day | ORAL | Status: DC

## 2013-12-02 MED ORDER — OMEPRAZOLE 20 MG PO CPDR: 20.0000 mg | DELAYED_RELEASE_CAPSULE | Freq: Every day | ORAL | Status: DC

## 2013-12-02 MED ORDER — INSULIN LISPRO 100 UNIT/ML (KWIKPEN): PEN_INJECTOR | SUBCUTANEOUS | Status: DC

## 2013-12-02 MED ORDER — ATORVASTATIN CALCIUM 20 MG PO TABS: 20.0000 mg | ORAL_TABLET | Freq: Every day | ORAL | Status: DC

## 2013-12-02 MED ORDER — AMLODIPINE BESYLATE 5 MG PO TABS: 5.0000 mg | ORAL_TABLET | Freq: Every day | ORAL | Status: DC

## 2013-12-02 MED ORDER — METFORMIN HCL 500 MG PO TABS: 500.0000 mg | ORAL_TABLET | Freq: Two times a day (BID) | ORAL | Status: DC

## 2013-12-02 MED ORDER — INSULIN DETEMIR 100 UNIT/ML FLEXPEN: 30.0000 [IU] | PEN_INJECTOR | Freq: Every day | SUBCUTANEOUS | Status: DC

## 2013-12-02 MED ORDER — MUPIROCIN CALCIUM 2 % EX CREA: 1.0000 "application " | TOPICAL_CREAM | Freq: Two times a day (BID) | CUTANEOUS | Status: DC

## 2013-12-02 MED ORDER — DOXYCYCLINE HYCLATE 100 MG PO CAPS: 100.0000 mg | ORAL_CAPSULE | Freq: Two times a day (BID) | ORAL | Status: DC

## 2013-12-02 NOTE — ED Notes (Signed)
MD at bedside. 

## 2013-12-02 NOTE — ED Notes (Signed)
Pt without medications x 1 week.

## 2013-12-02 NOTE — ED Notes (Signed)
Pt states that he is diabetic.  C/o ulcer to 3rd digit on rt foot x 1 wk.  Also states that he has been feeling numbness to bilateral legs x 1 wk.

## 2013-12-02 NOTE — ED Provider Notes (Signed)
CSN: [id number]     Arrival date & time 12/02/13  1056 History   First MD Initiated Contact with Patient 12/02/13 1141     Chief Complaint  Patient presents with  . Skin Ulcer     (Consider location/radiation/quality/duration/timing/severity/associated sxs/prior Treatment) HPI Comments: Patient presents with an ulcer on his right foot at the third digit. He states it started about a week ago. He has a history of diabetes and states that he's been out of his medicines for about a week. He still taking insulin reportedly. He states his blood sugars have been high in the 4-500 range. He denies any nausea or vomiting. He denies he fevers. He has bilateral worsening numbness to both legs from the knees down in a stocking glove pattern.  He has had this numbness before and says it normally worsens when his sugars are uncontrolled. He denies any weakness of the legs but he has had some trouble walking due to the numbness and the pain from his ulcer.   Past Medical History  Diagnosis Date  . Diabetes mellitus type II, uncontrolled   . Hypertension   . Tobacco use   . Hyperlipidemia   . Other and unspecified angina pectoris    Past Surgical History  Procedure Laterality Date  . Spinal tap     Family History  Problem Relation Age of Onset  . Cancer Mother   . Diabetes Father   . Hypertension Father    History  Substance Use Topics  . Smoking status: Current Every Day Smoker -- 1.00 packs/day for 20 years    Types: Cigarettes  . Smokeless tobacco: Never Used  . Alcohol Use: Yes     Comment: occ    Review of Systems  Constitutional: Negative for fever, chills, diaphoresis and fatigue.  HENT: Negative for congestion, rhinorrhea and sneezing.   Eyes: Negative.   Respiratory: Negative for cough, chest tightness and shortness of breath.   Cardiovascular: Negative for chest pain and leg swelling.  Gastrointestinal: Negative for nausea, vomiting, abdominal pain, diarrhea and blood in  stool.  Genitourinary: Negative for frequency, hematuria, flank pain and difficulty urinating.  Musculoskeletal: Negative for arthralgias and back pain.  Skin: Positive for wound. Negative for rash.  Neurological: Positive for numbness. Negative for dizziness, speech difficulty, weakness and headaches.      Allergies  Bee venom and Penicillins  Home Medications   Prior to Admission medications   Medication Sig Start Date End Date Taking? Authorizing Provider  Cyanocobalamin (VITAMIN B-12 PO) Take 1 capsule by mouth daily.   Yes Historical Provider, MD  tadalafil (CIALIS) 5 MG tablet Take 1 tablet (5 mg total) by mouth daily as needed for erectile dysfunction. 08/27/13  Yes Doe-Hyun R Shawna Orleans, DO  amLODipine (NORVASC) 5 MG tablet Take 1 tablet (5 mg total) by mouth daily. 12/02/13   Malvin Johns, MD  atorvastatin (LIPITOR) 20 MG tablet Take 1 tablet (20 mg total) by mouth daily. 12/02/13   Malvin Johns, MD  doxycycline (VIBRAMYCIN) 100 MG capsule Take 1 capsule (100 mg total) by mouth 2 (two) times daily. One po bid x 7 days 12/02/13   Malvin Johns, MD  Insulin Detemir (LEVEMIR FLEXPEN) 100 UNIT/ML Pen Inject 30 Units into the skin at bedtime. 12/02/13   Malvin Johns, MD  insulin lispro (HUMALOG KWIKPEN) 100 UNIT/ML KiwkPen Use 10 units 10-15 minutes before your evening meal 12/02/13   Malvin Johns, MD  lisinopril (PRINIVIL,ZESTRIL) 20 MG tablet Take 1 tablet (20 mg total)  by mouth daily. 12/02/13   Malvin Johns, MD  metFORMIN (GLUCOPHAGE) 500 MG tablet Take 1 tablet (500 mg total) by mouth 2 (two) times daily with a meal. 12/02/13   Malvin Johns, MD  mupirocin cream (BACTROBAN) 2 % Apply 1 application topically 2 (two) times daily. 12/02/13   Malvin Johns, MD  nitroGLYCERIN (NITROSTAT) 0.4 MG SL tablet Place 1 tablet (0.4 mg total) under the tongue every 5 (five) minutes as needed for chest pain. 07/26/13   Jettie Booze, MD  omeprazole (PRILOSEC) 20 MG capsule Take 1 capsule (20 mg total) by mouth  daily. 12/02/13   Malvin Johns, MD   BP 145/92  Pulse 97  Temp(Src) 97.8 F (36.6 C) (Oral)  Resp 18  SpO2 99% Physical Exam  Constitutional: He is oriented to person, place, and time. He appears well-developed and well-nourished.  HENT:  Head: Normocephalic and atraumatic.  Eyes: Pupils are equal, round, and reactive to light.  Neck: Normal range of motion. Neck supple.  Cardiovascular: Normal rate, regular rhythm and normal heart sounds.   Pulmonary/Chest: Effort normal and breath sounds normal. No respiratory distress. He has no wheezes. He has no rales. He exhibits no tenderness.  Abdominal: Soft. Bowel sounds are normal. There is no tenderness. There is no rebound and no guarding.  Musculoskeletal: Normal range of motion. He exhibits no edema.  Lymphadenopathy:    He has no cervical adenopathy.  Neurological: He is alert and oriented to person, place, and time.  Subjective numbness to LT both lower extremities from the knees down.  Normal motor function to legs  Skin: Skin is warm and dry. No rash noted.  1cm ulcer to the medial surface of the right third toe.  No evidence of penetration to deeper tissues.  No evidence of gangrene,, although he does have some mild darker discoloration to the dorsum of the toe.  Pedal pulses are intact  Psychiatric: He has a normal mood and affect.    ED Course  Procedures (including critical care time) Labs Review Results for orders placed during the hospital encounter of 12/02/13  CBG MONITORING, ED      Result Value Ref Range   Glucose-Capillary 249 (*) 70 - 99 mg/dL   Dg Toe 3rd Right  12/02/2013   CLINICAL DATA:  Pain and swelling involving the right foot for 1 week. Open sore involving the medial side of the right third digit. History diabetes.  EXAM: RIGHT THIRD TOE  COMPARISON:  None.  FINDINGS: Examination is degraded due to obliquity, overlying osseous and soft tissue structures and the 3rd digit being held in flexion.  No definite  fracture or dislocation. Joint spaces appear preserved given obliquity. No definite erosions. Regional soft tissues appear normal. No discrete area of osteolysis to suggest osteomyelitis. No radiopaque foreign body.  IMPRESSION: No fracture or definite evidence of osteomyelitis. Further evaluation could be performed with MRI as clinically indicated.   Electronically Signed   By: Sandi Mariscal M.D.   On: 12/02/2013 12:42     Imaging Review Dg Toe 3rd Right  12/02/2013   CLINICAL DATA:  Pain and swelling involving the right foot for 1 week. Open sore involving the medial side of the right third digit. History diabetes.  EXAM: RIGHT THIRD TOE  COMPARISON:  None.  FINDINGS: Examination is degraded due to obliquity, overlying osseous and soft tissue structures and the 3rd digit being held in flexion.  No definite fracture or dislocation. Joint spaces appear preserved given obliquity.  No definite erosions. Regional soft tissues appear normal. No discrete area of osteolysis to suggest osteomyelitis. No radiopaque foreign body.  IMPRESSION: No fracture or definite evidence of osteomyelitis. Further evaluation could be performed with MRI as clinically indicated.   Electronically Signed   By: Sandi Mariscal M.D.   On: 12/02/2013 12:42     EKG Interpretation None      MDM   Final diagnoses:  Diabetic ulcer of right foot associated with diabetes mellitus due to underlying condition    Patient presents with a small ulcer to his right toe. I will go ahead and start him on doxycycline and Bactroban ointment. I gave him a referral to call make an appointment to follow up at the wound care center. I advised to return here for symptoms worsen. I also given a refill on his medications. And I gave him a resource guide for outpatient followup.    Malvin Johns, MD 12/02/13 1316

## 2013-12-02 NOTE — Discharge Instructions (Signed)
Diabetes and Foot Care Diabetes may cause you to have problems because of poor blood supply (circulation) to your feet and legs. This may cause the skin on your feet to become thinner, break easier, and heal more slowly. Your skin may become dry, and the skin may peel and crack. You may also have nerve damage in your legs and feet causing decreased feeling in them. You may not notice minor injuries to your feet that could lead to infections or more serious problems. Taking care of your feet is one of the most important things you can do for yourself.  HOME CARE INSTRUCTIONS  Wear shoes at all times, even in the house. Do not go barefoot. Bare feet are easily injured.  Check your feet daily for blisters, cuts, and redness. If you cannot see the bottom of your feet, use a mirror or ask someone for help.  Wash your feet with warm water (do not use hot water) and mild soap. Then pat your feet and the areas between your toes until they are completely dry. Do not soak your feet as this can dry your skin.  Apply a moisturizing lotion or petroleum jelly (that does not contain alcohol and is unscented) to the skin on your feet and to dry, brittle toenails. Do not apply lotion between your toes.  Trim your toenails straight across. Do not dig under them or around the cuticle. File the edges of your nails with an emery board or nail file.  Do not cut corns or calluses or try to remove them with medicine.  Wear clean socks or stockings every day. Make sure they are not too tight. Do not wear knee-high stockings since they may decrease blood flow to your legs.  Wear shoes that fit properly and have enough cushioning. To break in new shoes, wear them for just a few hours a day. This prevents you from injuring your feet. Always look in your shoes before you put them on to be sure there are no objects inside.  Do not cross your legs. This may decrease the blood flow to your feet.  If you find a minor scrape,  cut, or break in the skin on your feet, keep it and the skin around it clean and dry. These areas may be cleansed with mild soap and water. Do not cleanse the area with peroxide, alcohol, or iodine.  When you remove an adhesive bandage, be sure not to damage the skin around it.  If you have a wound, look at it several times a day to make sure it is healing.  Do not use heating pads or hot water bottles. They may burn your skin. If you have lost feeling in your feet or legs, you may not know it is happening until it is too late.  Make sure your health care provider performs a complete foot exam at least annually or more often if you have foot problems. Report any cuts, sores, or bruises to your health care provider immediately. SEEK MEDICAL CARE IF:   You have an injury that is not healing.  You have cuts or breaks in the skin.  You have an ingrown nail.  You notice redness on your legs or feet.  You feel burning or tingling in your legs or feet.  You have pain or cramps in your legs and feet.  Your legs or feet are numb.  Your feet always feel cold. SEEK IMMEDIATE MEDICAL CARE IF:   There is increasing redness,  swelling, or pain in or around a wound.  There is a red line that goes up your leg.  Pus is coming from a wound.  You develop a fever or as directed by your health care provider.  You notice a bad smell coming from an ulcer or wound. Document Released: 03/12/2000 Document Revised: 11/15/2012 Document Reviewed: 08/22/2012 [facility] Patient Information 2015 [place], [place]. This information is not intended to replace advice given to you by your health care provider. Make sure you discuss any questions you have with your health care provider.  Skin Ulcer A skin ulcer is an open sore that can be shallow or deep. Skin ulcers sometimes become infected and are difficult to treat. It may be 1 month or longer before real healing progress is made. CAUSES   Injury.  Problems  with the veins or arteries.  Diabetes.  Insect bites.  Bedsores.  Inflammatory conditions. SYMPTOMS   Pain, redness, swelling, and tenderness around the ulcer.  Fever.  Bleeding from the ulcer.  Yellow or clear fluid coming from the ulcer. DIAGNOSIS  There are many types of skin ulcers. Any open sores will be examined. Certain tests will be done to determine the kind of ulcer you have. The right treatment depends on the type of ulcer you have. TREATMENT  Treatment is a long-term challenge. It may include:  Wearing an elastic wrap, compression stockings, or gel cast over the ulcer area.  Taking antibiotic medicines or putting antibiotic creams on the affected area if there is an infection. HOME CARE INSTRUCTIONS  Put on your bandages (dressings), wraps, or casts over the ulcer as directed by your caregiver.  Change all dressings as directed by your caregiver.  Take all medicines as directed by your caregiver.  Keep the affected area clean and dry.  Avoid injuries to the affected area.  Eat a well-balanced, healthy diet that includes plenty of fruit and vegetables.  If you smoke, consider quitting or decreasing the amount of cigarettes you smoke.  Once the ulcer heals, get regular exercise as directed by your caregiver.  Work with your caregiver to make sure your blood pressure, cholesterol, and diabetes are well-controlled.  Keep your skin moisturized. Dry skin can crack and lead to skin ulcers. SEEK IMMEDIATE MEDICAL CARE IF:   Your pain gets worse.  You have swelling, redness, or fluids around the ulcer.  You have chills.  You have a fever. MAKE SURE YOU:   Understand these instructions.  Will watch your condition.  Will get help right away if you are not doing well or get worse. Document Released: 04/22/2004 Document Revised: 06/07/2011 Document Reviewed: 10/30/2010 [facility] Patient Information 2015 [place], [place]. This information is not intended to  replace advice given to you by your health care provider. Make sure you discuss any questions you have with your health care provider.   Emergency Department Resource Guide 1) Find a Doctor and Pay Out of Pocket Although you won't have to find out who is covered by your insurance plan, it is a good idea to ask around and get recommendations. You will then need to call the office and see if the doctor you have chosen will accept you as a new patient and what types of options they offer for patients who are self-pay. Some doctors offer discounts or will set up payment plans for their patients who do not have insurance, but you will need to ask so you aren't surprised when you get to your appointment.  2)  [place] Department Not all health departments have doctors that can see patients for sick visits, but many do, so it is worth a call to see if yours does. If you don't know where your local health department is, you can check in your phone book. The CDC also has a tool to help you locate your state's health department, and many state websites also have listings of all of their local health departments.  3) Find a [facility] If your illness is not likely to be very severe or complicated, you may want to try a walk in clinic. These are popping up all over the country in pharmacies, drugstores, and shopping centers. They're usually staffed by nurse practitioners or physician assistants that have been trained to treat common illnesses and complaints. They're usually fairly quick and inexpensive. However, if you have serious medical issues or chronic medical problems, these are probably not your best option.  No Primary Care Doctor: - Call Health Connect at  [phone] - they can help you locate a primary care doctor that  accepts your insurance, provides certain services, etc. - Physician Referral Service- [phone]  Chronic Pain Problems: Organization         Address  Phone    Notes  [facility]  [phone] Patients need to be referred by their primary care doctor.   Medication Assistance: Organization         Address  Phone   Notes  [facility] Medication [facility] [address] [place], [place] [address] [phone] --Must be a resident of [facility] -- Must have NO insurance coverage whatsoever (no Medicaid/ Medicare, etc.) -- The pt. MUST have a primary care doctor that directs their care regularly and follows them in the community   MedAssist  [phone]   [employer]  [phone]    Agencies that provide inexpensive medical care: Organization         Address  Phone   Notes  [place]  [phone]   [facility]    [phone]   [facility] [place],  [phone]   [place] [address], [place] [phone]   Planned Parenthood    [phone]   [facility]    [phone]   [place] and [address], [place] Phone:  [phone], Fax:  [phone] Hours of Operation:  9 am - 6 pm, M-F.  Also accepts Medicaid/Medicare and self-pay.  [facility] [address], [address], [place] Phone: [phone], Fax: [phone]. Hours of Operation:  8:30 am - 5:30 pm, M-F.  Also accepts Medicaid and self-pay.  [facility] [place] [address], [place] Phone: [phone]   [place], [place], [place] [phone], Ext. [phone] Mondays & Thursdays: 7-9 AM.  First 15 patients are seen on a first come, first serve basis.    [place] Providers:  Organization         Address  Phone   Notes  [facility] [address], [place] [phone] Also accepts self-pay patients.  [place], [place]  [phone]   [place], [address],  [place] [phone]   [place] [address] [place] [phone]   Lucianne Lei [address], [place]   [phone] Only accepts [place] Access [place] patients after they have their name applied to their card.   Self-Pay (no insurance) in [facility]:  Organization         Address  Phone   Notes  Sickle Cell Patients, [facility] [place] [phone]   [facility] [place] [phone]   [facility] [place]  [place], [place],  [phone]   [facility]/Dr. Osei-Bonsu  [address], [place] or [address], [place] [phone] Phone number for both [place] and [place] locations is the same.  [facility] [address] [place] [phone]   [facility] [address] [place] or [address] [phone]   [facility] [address], [place] [phone], phone; [phone], fax Sees patients 1st and 3rd Saturday of every month.  Must not qualify for public or private insurance (i.e. Medicaid, Medicare, [place] Health Choice, Veterans' Benefits)  Household income should be no more than 200% of the poverty level The clinic cannot treat you if you are pregnant or think you are pregnant  Sexually transmitted diseases are not treated at the clinic.   Dental Care: Organization         Address  Phone  Notes  [facility] Department of [facility] [place] [phone] Accepts children up to age 77 who are enrolled in [place] or [place]; pregnant women with a Medicaid card; and children who have  applied for Medicaid or [place] Health Choice, but were declined, whose parents can pay a reduced fee at time of service.  [facility] Department of [facility]  [address], [place] [phone] Accepts children up to age 58 who are enrolled in [place] or [place]; pregnant women with a Medicaid card; and children who have applied for Medicaid or [place] Health Choice, but were declined, whose parents can pay a reduced fee at time of service.  [place] Adult [facility] Access PROGRAM  [place] [phone] Patients are seen by appointment only. Walk-ins are not accepted. [place] will see patients 22 years of age and older. Monday - Tuesday (8am-5pm) Most Wednesdays (8:30-5pm) $30 per visit, cash only  [facility] Adult Dental Access PROGRAM  [address], [employer] [phone] Patients are seen by appointment only. Walk-ins are not accepted. [place] will see patients 46 years of age and older. One Wednesday Evening (Monthly: Volunteer Based).  $30 per visit, cash only  [place]  [phone] for adults; Children under age 34, call Graduate Pediatric Dentistry at [phone]. Children aged 74-14, please call [phone] to request a pediatric application.  Dental services are provided in all areas of dental care including fillings, crowns and bridges, complete and partial dentures, implants, gum treatment, root canals, and extractions. Preventive care is also provided. Treatment is provided to both adults and children. Patients are selected via a lottery and there is often a waiting list.   [facility] [address], [place]  [phone])  [id number] www.drcivils.com   [facility] [address] [place], [place] [phone], Ext. [phone] Second and Fourth Thursday of each month, opens at 6:30 AM; Clinic ends at 9 AM.  Patients are seen on a first-come first-served basis, and a  limited number are seen during each clinic.   [facility]  [address] Hillard Danker [place], [place] [phone]   Eligibility Requirements You must have lived in [place], [place], or [place] counties for at least the last three months.   You cannot be eligible for state or federal sponsored [employer], including [employer], [place], or [employer].   You generally cannot be eligible for healthcare insurance through your employer.    How to apply: Eligibility screenings are held every Tuesday and Wednesday afternoon from 1:00 pm until 4:00 pm. You do not need an appointment for the interview!  [facility] [address], [place], [place]   [place]  [place] Department  [place]  [phone]    Behavioral Health Resources in the Community: Intensive Outpatient Programs Organization         Address  Phone  Notes  [place]. [address], [place], [place] [phone]   [facility] Outpatient [address], [place], [place]   ADS: Alcohol & Drug Svcs [address], [place], [place]   [place] [address]  [place], [place] or [phone]   Substance Abuse Resources Organization         Address  Phone  Notes  Alcohol and Drug Services  [phone]   [place]  [phone]   [facility] [place]   Chinita Pester  [phone]   Residential & Outpatient Substance Abuse Program  [phone]   Psychological Services Organization         Address  Phone  Notes  [facility] [place]  [place]  [phone]   [place] [address], [place] or [phone]    Mobile Crisis Teams Organization          Address  Phone  Notes  [facility], Mobile Crisis Care Unit  [phone]   [facility]ive Psychotherapeutic Services  [address] [place], [place]   [place] [address], [place] [phone]    Self-Help/Support Groups Organization         Address  Phone             Notes  [place]. of [place] - variety of support groups  [place]yler Call for more information  Narcotics Anonymous (NA), [facility] [address], [employer] [place]  2 meetings at this location   Special educational needs teacher         Address  Phone  Notes  ASAP Residential Treatment [place],    [place]  [phone]   [facility]  [address] [place] [id number], [name], [place]   [place], [place] [phone] Admissions: 8am-3pm M-F  Incentives Substance [place] [address]    [place], [place] [phone]   [facility] [address] Jadene Pierini [place], [place]   [facility] [address]  [place], [place]   [facility]ight Programs -  Intensive Outpatient [address], [place], [place] [phone]   [facility] [facility] [place].,  [place], [place] [phone] or [phone]   [facility]idential Treatment Services (RTS) [address], [place], [place] Accepts Medicaid  [facility] [place] [address],  [place] [phone] Substance Abuse/Addiction Treatment   [facility] Organization         Address  Phone  Notes  [facility]  [phone]   Domenic Schwab, PhD [address] [place]lis [place], [place]   [phone] or [phone]   [employer] [place], [place] [phone]   [address], [place], [place] [phone] Insurance/Medicaid/sponsorship  through [facility] and Families [address] [place] [place]                                    [place], [place] [phone] [place] [address], [place] [phone]    Dr. Adele Schilder  [phone]   [facility] Dept. 1) [address], [place] 2) [place] 3)  [place] [address], [place] [phone]  [phone]   [place] [phone] or [phone] (After Hours)

## 2014-03-06 ENCOUNTER — Encounter (HOSPITAL_COMMUNITY): Payer: Self-pay

## 2014-03-06 ENCOUNTER — Emergency Department (HOSPITAL_COMMUNITY): Payer: Self-pay

## 2014-03-06 ENCOUNTER — Emergency Department (HOSPITAL_COMMUNITY)
Admission: EM | Admit: 2014-03-06 | Discharge: 2014-03-06 | Disposition: A | Discharge status: Home / Self Care | Payer: Self-pay | Attending: Emergency Medicine | Admitting: Emergency Medicine

## 2014-03-06 DIAGNOSIS — K2970 Gastritis, unspecified, without bleeding: Secondary | ICD-10-CM | POA: Insufficient documentation

## 2014-03-06 DIAGNOSIS — Z9889 Other specified postprocedural states: Secondary | ICD-10-CM | POA: Insufficient documentation

## 2014-03-06 DIAGNOSIS — I209 Angina pectoris, unspecified: Secondary | ICD-10-CM | POA: Insufficient documentation

## 2014-03-06 DIAGNOSIS — R109 Unspecified abdominal pain: Secondary | ICD-10-CM

## 2014-03-06 DIAGNOSIS — K920 Hematemesis: Secondary | ICD-10-CM

## 2014-03-06 DIAGNOSIS — G629 Polyneuropathy, unspecified: Secondary | ICD-10-CM | POA: Insufficient documentation

## 2014-03-06 DIAGNOSIS — I10 Essential (primary) hypertension: Secondary | ICD-10-CM | POA: Insufficient documentation

## 2014-03-06 DIAGNOSIS — E119 Type 2 diabetes mellitus without complications: Secondary | ICD-10-CM | POA: Insufficient documentation

## 2014-03-06 DIAGNOSIS — E785 Hyperlipidemia, unspecified: Secondary | ICD-10-CM | POA: Insufficient documentation

## 2014-03-06 DIAGNOSIS — Z794 Long term (current) use of insulin: Secondary | ICD-10-CM | POA: Insufficient documentation

## 2014-03-06 DIAGNOSIS — Z79899 Other long term (current) drug therapy: Secondary | ICD-10-CM | POA: Insufficient documentation

## 2014-03-06 DIAGNOSIS — K921 Melena: Secondary | ICD-10-CM | POA: Insufficient documentation

## 2014-03-06 DIAGNOSIS — Z720 Tobacco use: Secondary | ICD-10-CM | POA: Insufficient documentation

## 2014-03-06 LAB — COMPREHENSIVE METABOLIC PANEL
ALBUMIN: 3.4 g/dL — AB (ref 3.5–5.2)
ALT: 12 U/L (ref 0–53)
ANION GAP: 13 (ref 5–15)
AST: 16 U/L (ref 0–37)
Alkaline Phosphatase: 107 U/L (ref 39–117)
BUN: 16 mg/dL (ref 6–23)
CO2: 25 meq/L (ref 19–32)
CREATININE: 0.85 mg/dL (ref 0.50–1.35)
Calcium: 9.6 mg/dL (ref 8.4–10.5)
Chloride: 99 mEq/L (ref 96–112)
GFR calc Af Amer: >90 mL/min (ref >90)
Glucose, Bld: 300 mg/dL — ABNORMAL HIGH (ref 70–99)
Potassium: 4.5 mEq/L (ref 3.7–5.3)
Sodium: 137 mEq/L (ref 137–147)
Total Protein: 7.5 g/dL (ref 6.0–8.3)

## 2014-03-06 LAB — CBC WITH DIFFERENTIAL/PLATELET
BASOS PCT: 0 % (ref 0–1)
Basophils Absolute: 0 10*3/uL (ref 0.0–0.1)
EOS PCT: 2 % (ref 0–5)
Eosinophils Absolute: 0.2 10*3/uL (ref 0.0–0.7)
HEMATOCRIT: 43 % (ref 39.0–52.0)
Hemoglobin: 14.1 g/dL (ref 13.0–17.0)
Lymphocytes Relative: 34 % (ref 12–46)
Lymphs Abs: 3.6 10*3/uL (ref 0.7–4.0)
MCH: 28.5 pg (ref 26.0–34.0)
MCHC: 32.8 g/dL (ref 30.0–36.0)
MCV: 87 fL (ref 78.0–100.0)
MONO ABS: 0.7 10*3/uL (ref 0.1–1.0)
Monocytes Relative: 6 % (ref 3–12)
Neutro Abs: 6.2 10*3/uL (ref 1.7–7.7)
Neutrophils Relative %: 58 % (ref 43–77)
Platelets: 258 10*3/uL (ref 150–400)
RBC: 4.94 MIL/uL (ref 4.22–5.81)
RDW: 14.6 % (ref 11.5–15.5)
WBC: 10.6 10*3/uL — ABNORMAL HIGH (ref 4.0–10.5)

## 2014-03-06 LAB — LIPASE, BLOOD: LIPASE: 12 U/L (ref 11–59)

## 2014-03-06 LAB — CBG MONITORING, ED: GLUCOSE-CAPILLARY: 285 mg/dL — AB (ref 70–99)

## 2014-03-06 LAB — POC OCCULT BLOOD, ED: Fecal Occult Bld: NEGATIVE

## 2014-03-06 MED ORDER — ONDANSETRON HCL 4 MG/2ML IJ SOLN
4.0000 mg | Freq: Once | INTRAMUSCULAR | Status: AC
  Administered 2014-03-06: 4 mg via INTRAVENOUS
  Filled 2014-03-06: qty 2

## 2014-03-06 MED ORDER — GABAPENTIN 300 MG PO CAPS
300.0000 mg | ORAL_CAPSULE | Freq: Three times a day (TID) | ORAL | Status: DC
Start: 2014-03-07 — End: 2014-07-12

## 2014-03-06 MED ORDER — TRAMADOL HCL 50 MG PO TABS: 50.0000 mg | ORAL_TABLET | Freq: Four times a day (QID) | ORAL | Status: DC | PRN

## 2014-03-06 MED ORDER — SODIUM CHLORIDE 0.9 % IV SOLN
80.0000 mg | Freq: Once | INTRAVENOUS | Status: AC
  Administered 2014-03-06: 80 mg via INTRAVENOUS
  Filled 2014-03-06: qty 80

## 2014-03-06 MED ORDER — GABAPENTIN 300 MG PO CAPS
300.0000 mg | ORAL_CAPSULE | Freq: Once | ORAL | Status: AC
  Administered 2014-03-06: 300 mg via ORAL
  Filled 2014-03-06 (×2): qty 1

## 2014-03-06 MED ORDER — OMEPRAZOLE 20 MG PO CPDR: 20.0000 mg | DELAYED_RELEASE_CAPSULE | Freq: Every day | ORAL | Status: DC

## 2014-03-06 MED ORDER — MORPHINE SULFATE 4 MG/ML IJ SOLN
4.0000 mg | Freq: Once | INTRAMUSCULAR | Status: AC
  Administered 2014-03-06: 4 mg via INTRAVENOUS
  Filled 2014-03-06: qty 1

## 2014-03-06 NOTE — Discharge Instructions (Signed)
We saw you in the ER for the stomach pain, bloody emesis. Please take the omeprazole as prescribed and see the GI doctor - we suspect that you have gastritis. RETURN TO THE ER IF YOU CONTINUE TO HAVE BLOODY VOMITUS OR BLOODY STOOLS.  The pain in your feet appear to be neuropathy. Unfortunately, we dont know how to manage neuropathy from the ER, and it is prudent that you see a primary care doctor for further care. Pain meds have been prescribed for it, and take gabapentin regularly for now. Dont be surprised if there is poor response to meds.  The workup in the ER is not complete, and is limited to screening for life threatening and emergent conditions only, so please see a primary care doctor for further evaluation.   Diabetic Neuropathy Diabetic neuropathy is a nerve disease or nerve damage that is caused by diabetes mellitus. About half of all people with diabetes mellitus have some form of nerve damage. Nerve damage is more common in those who have had diabetes mellitus for many years and who generally have not had good control of their blood sugar (glucose) level. Diabetic neuropathy is a common complication of diabetes mellitus. There are three more common types of diabetic neuropathy and a fourth type that is less common and less understood:   Peripheral neuropathy--This is the most common type of diabetic neuropathy. It causes damage to the nerves of the feet and legs first and then eventually the hands and arms.The damage affects the ability to sense touch.  Autonomic neuropathy--This type causes damage to the autonomic nervous system, which controls the following functions:  Heartbeat.  Body temperature.  Blood pressure.  Urination.  Digestion.  Sweating.  Sexual function.  Focal neuropathy--Focal neuropathy can be painful and unpredictable and occurs most often in older adults with diabetes mellitus. It involves a specific nerve or one area and often comes on suddenly. It  usually does not cause long-term problems.  Radiculoplexus neuropathy-- Sometimes called lumbosacral radiculoplexus neuropathy, radiculoplexus neuropathy affects the nerves of the thighs, hips, buttocks, or legs. It is more common in people with type 2 diabetes mellitus and in older men. It is characterized by debilitating pain, weakness, and atrophy, usually in the thigh muscles. CAUSES  The cause of peripheral, autonomic, and focal neuropathies is diabetes mellitus that is uncontrolled and high glucose levels. The cause of radiculoplexus neuropathy is unknown. However, it is thought to be caused by inflammation related to uncontrolled glucose levels. SIGNS AND SYMPTOMS  Peripheral Neuropathy Peripheral neuropathy develops slowly over time. When the nerves of the feet and legs no longer work there may be:   Burning, stabbing, or aching pain in the legs or feet.  Inability to feel pressure or pain in your feet. This can lead to:  Thick calluses over pressure areas.  Pressure sores.  Ulcers.  Foot deformities.  Reduced ability to feel temperature changes.  Muscle weakness. Autonomic Neuropathy The symptoms of autonomic neuropathy vary depending on which nerves are affected. Symptoms may include:  Problems with digestion, such as:  Feeling sick to your stomach (nausea).  Vomiting.  Bloating.  Constipation.  Diarrhea.  Abdominal pain.  Difficulty with urination. This occurs if you lose your ability to sense when your bladder is full. Problems include:  Urine leakage (incontinence).  Inability to empty your bladder completely (retention).  Rapid or irregular heartbeat (palpitations).  Blood pressure drops when you stand up (orthostatic hypotension). When you stand up you may feel:  Dizzy.  Weak.  Faint.  In men, inability to attain and maintain an erection.  In women, vaginal dryness and problems with decreased sexual desire and arousal.  Problems with body  temperature regulation.  Increased or decreased sweating. Focal Neuropathy  Abnormal eye movements or abnormal alignment of both eyes.  Weakness in the wrist.  Foot drop. This results in an inability to lift the foot properly and abnormal walking or foot movement.  Paralysis on one side of your face (Bell palsy).  Chest or abdominal pain. Radiculoplexus Neuropathy  Sudden, severe pain in your hip, thigh, or buttocks.  Weakness and wasting of thigh muscles.  Difficulty rising from a seated position.  Abdominal swelling.  Unexplained weight loss (usually more than 10 lb [4.5 kg]). DIAGNOSIS  Peripheral Neuropathy Your senses may be tested. Sensory function testing can be done with:  A light touch using a monofilament.  A vibration with tuning fork.  A sharp sensation with a pin prick. Other tests that can help diagnose neuropathy are:  Nerve conduction velocity. This test checks the transmission of an electrical current through a nerve.  Electromyography. This shows how muscles respond to electrical signals transmitted by nearby nerves.  Quantitative sensory testing. This is used to assess how your nerves respond to vibrations and changes in temperature. Autonomic Neuropathy Diagnosis is often based on reported symptoms. Tell your health care provider if you experience:   Dizziness.   Constipation.   Diarrhea.   Inappropriate urination or inability to urinate.   Inability to get or maintain an erection.  Tests that may be done include:   Electrocardiography or Holter monitor. These are tests that can help show problems with the heart rate or heart rhythm.   An X-ray exam may be done. Focal Neuropathy Diagnosis is made based on your symptoms and what your health care provider finds during your exam. Other tests may be done. They may include:  Nerve conduction velocities. This checks the transmission of electrical current through a  nerve.  Electromyography. This shows how muscles respond to electrical signals transmitted by nearby nerves.  Quantitative sensory testing. This test is used to assess how your nerves respond to vibration and changes in temperature. Radiculoplexus Neuropathy  Often the first thing is to eliminate any other issue or problems that might be the cause, as there is no stick test for diagnosis.  X-ray exam of your spine and lumbar region.  Spinal tap to rule out cancer.  MRI to rule out other lesions. TREATMENT  Once nerve damage occurs, it cannot be reversed. The goal of treatment is to keep the disease or nerve damage from getting worse and affecting more nerve fibers. Controlling your blood glucose level is the key. Most people with radiculoplexus neuropathy see at least a partial improvement over time. You will need to keep your blood glucose and HbA1c levels in the target range determined by your health care provider. Things that help control blood glucose levels include:   Blood glucose monitoring.   Meal planning.   Physical activity.   Diabetes medicine.  Over time, maintaining lower blood glucose levels helps lessen symptoms. Sometimes, prescription pain medicine is needed. HOME CARE INSTRUCTIONS:  Do not smoke.  Keep your blood glucose level in the range that you and your health care provider have determined acceptable for you.  Keep your blood pressure level in the range that you and your health care provider have determined acceptable for you.  Eat a well-balanced diet.  Be active  every day.  Check your feet every day. SEEK MEDICAL CARE IF:   You have burning, stabbing, or aching pain in the legs or feet.  You are unable to feel pressure or pain in your feet.  You develop problems with digestion such as:  Nausea.  Vomiting.  Bloating.  Constipation.  Diarrhea.  Abdominal pain.  You have difficulty with urination, such  as:  Incontinence.  Retention.  You have palpitations.  You develop orthostatic hypotension. When you stand up you may feel:  Dizzy.  Weak.  Faint.  You cannot attain and maintain an erection (in men).  You have vaginal dryness and problems with decreased sexual desire and arousal (in women).  You have severe pain in your thighs, legs, or buttocks.  You have unexplained weight loss. Document Released: 05/24/2001 Document Revised: 01/03/2013 Document Reviewed: 08/24/2012 [facility] Patient Information 2015 [place], [place]. This information is not intended to replace advice given to you by your health care provider. Make sure you discuss any questions you have with your health care provider.  Gastritis, Adult Gastritis is soreness and swelling (inflammation) of the lining of the stomach. Gastritis can develop as a sudden onset (acute) or long-term (chronic) condition. If gastritis is not treated, it can lead to stomach bleeding and ulcers. CAUSES  Gastritis occurs when the stomach lining is weak or damaged. Digestive juices from the stomach then inflame the weakened stomach lining. The stomach lining may be weak or damaged due to viral or bacterial infections. One common bacterial infection is the Helicobacter pylori infection. Gastritis can also result from excessive alcohol consumption, taking certain medicines, or having too much acid in the stomach.  SYMPTOMS  In some cases, there are no symptoms. When symptoms are present, they may include:  Pain or a burning sensation in the upper abdomen.  Nausea.  Vomiting.  An uncomfortable feeling of fullness after eating. DIAGNOSIS  Your caregiver may suspect you have gastritis based on your symptoms and a physical exam. To determine the cause of your gastritis, your caregiver may perform the following:  Blood or stool tests to check for the H pylori bacterium.  Gastroscopy. A thin, flexible tube (endoscope) is passed down the  esophagus and into the stomach. The endoscope has a light and camera on the end. Your caregiver uses the endoscope to view the inside of the stomach.  Taking a tissue sample (biopsy) from the stomach to examine under a microscope. TREATMENT  Depending on the cause of your gastritis, medicines may be prescribed. If you have a bacterial infection, such as an H pylori infection, antibiotics may be given. If your gastritis is caused by too much acid in the stomach, H2 blockers or antacids may be given. Your caregiver may recommend that you stop taking aspirin, ibuprofen, or other nonsteroidal anti-inflammatory drugs (NSAIDs). HOME CARE INSTRUCTIONS  Only take over-the-counter or prescription medicines as directed by your caregiver.  If you were given antibiotic medicines, take them as directed. Finish them even if you start to feel better.  Drink enough fluids to keep your urine clear or pale yellow.  Avoid foods and drinks that make your symptoms worse, such as:  Caffeine or alcoholic drinks.  Chocolate.  Peppermint or mint flavorings.  Garlic and onions.  Spicy foods.  Citrus fruits, such as oranges, lemons, or limes.  Tomato-based foods such as sauce, chili, salsa, and pizza.  Fried and fatty foods.  Eat small, frequent meals instead of large meals. SEEK IMMEDIATE MEDICAL CARE IF:  You have black or dark red stools.  You vomit blood or material that looks like coffee grounds.  You are unable to keep fluids down.  Your abdominal pain gets worse.  You have a fever.  You do not feel better after 1 week.  You have any other questions or concerns. MAKE SURE YOU:  Understand these instructions.  Will watch your condition.  Will get help right away if you are not doing well or get worse. Document Released: 03/09/2001 Document Revised: 09/14/2011 Document Reviewed: 04/28/2011 [facility] Patient Information 2015 [place], [place]. This information is not intended to replace  advice given to you by your health care provider. Make sure you discuss any questions you have with your health care provider.  RESOURCE GUIDE  Chronic Pain Problems: Contact [facility]  [phone] Patients need to be referred by their primary care doctor.  Insufficient Money for Medicine: Contact [employer]:  call "211."   No Primary Care Doctor: - Call [facility] Connect  [phone] - can help you locate a primary care doctor that  accepts your insurance, provides certain services, etc. - Physician Referral Service- [phone]  Agencies that provide inexpensive medical care: - [facility]  [facility]  [phone] - [facility]  [phone] - [facility]  [phone] - Planned Parenthood  [facility]  [phone]  [place] Providers: - [facility]- [address]  [phone], Mon-Fri 9am-7pm, Sat 9am-1pm - [facility]- [place], [address] [place]  [place], [address] [place]  [place]- [address]  [place], [address], [phone]  Only accepts [place] patients after they have their name  applied to their card  Self Pay (no insurance) in [place]: - Sickle Cell Patients: Dr Kevan Ny, [name]  [place], [facility]- [place]  [facility] [place]- [id number] [place], [facility]- see information above (Speak to [employer] if you do not have insurance)       -  [facility]- [place],  [place], [place]  Dr Vista Lawman-  [address], [place], [place], [place]        -  Urgent Medical and [facility] - [address] [phone]       -  [facility] [place]- [address], [place], also [address] [phone]       -    [facility]- [place], [place], 1st & 3rd Saturday        every month, 10am-1pm  [facility] [place], [place] [address] [phone]  [facility] [place]. [place],  [phone]  1) Find a Doctor and Pay Out of Pocket Although you won't have to find out who is covered by your insurance plan, it is a good idea to ask around and get  recommendations. You will then need to call the office and see if the doctor you have chosen will accept you as a new patient and what types of options they offer for patients who are self-pay. Some doctors offer discounts or will set up payment plans for their patients who do not have insurance, but you will need to ask so you aren't surprised when you get to your appointment.  2) Contact Your Local Health Department Not all health departments have doctors that can see patients for sick visits, but many do, so it is worth a call to see if yours does. If you don't know where your local health department is, you can check in your phone book. The CDC also has a tool to help you locate your state's health department, and many state websites also have listings of all of their local health departments.  3) Find a [facility] If your illness is not likely to be very severe or complicated, you may want to try a walk in clinic. These are popping up all over the country in pharmacies, drugstores, and shopping centers. They're usually staffed by nurse practitioners or physician assistants that have been trained to treat common illnesses and complaints. They're usually fairly quick and inexpensive. However, if you have serious medical issues or chronic medical problems, these are probably not your  best option  STD Testing - [place], [facility], [address] [place], phone [phone] or [phone].  Monday - Friday, call for an appointment. - [place], [facility], [address], [place], phone [phone] or [phone].  Monday - Friday, call for an appointment.  Abuse/Neglect: - [place] [phone] - [place] [phone] (After Hours)  Emergency Shelter:  [facility] [phone]  Maternity Homes: - [facility] [phone] - [place] [phone]  MRSA Hotline #:   (915)550-2654  Dental Assistance If unable to pay or uninsured, contact:  [facility]. to become qualified for the adult dental clinic.  Patients with Medicaid: [facility] [phone] [address], [place] [address], [phone]  If unable to pay, or uninsured, contact [facility] [phone] in [place], [place] in [facility]) to become qualified for the adult dental clinic  [facility] [address] [place], [place] [address] [phone] [email or web address]  Other [place]: - [facility] [place], [place], [place], [address], [place], 2nd and 4th Thursday of the month at 6:30am.  10 clients each day by appointment, can sometimes see walk-in patients if someone does not show for an appointment. [facility]- [address] Hillard Danker [place], [place], [address], [place], [place], [place], [address], [place] Department- [place] Department- [place] Department- [phone]

## 2014-03-06 NOTE — ED Notes (Signed)
Pt presents with c/o abdominal pain and hematemesis that started around 3 a.m. this morning. Pt reports he has vomited twice since this morning. Pt also c/o knots on the bottoms of both of his feet. Pt reports he has had these for approx 2 months and it is uncomfortable for him to walk. Pt is a diabetic.

## 2014-03-06 NOTE — ED Notes (Signed)
Pt complaints of nausea, not actively vomiting at this time. States he vomited blood streaked emesis twice this am. States he's been under a lot of stress recently and smokes about half a pack a day. Complaints of pain on the edges of his feet. Balls of feet have calyses on both ends, no open ulcers, one small blood filled blister visualized. Feet warm with strong pulses bilaterally. CBG 285, states he's been out of his medications for about a month now and needs to get his prescriptions refilled. Pain radiating across upper abdomen, sharp/aching, tender to touch, hypoactive bowl sounds x4 quadrants, soft. States no BM over the last 3 days and not passing gas.

## 2014-03-06 NOTE — ED Provider Notes (Signed)
CSN: [id number]     Arrival date & time 03/06/14  1145 History   First MD Initiated Contact with Patient 03/06/14 1213     Chief Complaint  Patient presents with  . Hematemesis  . Abdominal Pain     (Consider location/radiation/quality/duration/timing/severity/associated sxs/prior Treatment) HPI Comments: Pt comes in with cc of nausea, abd pain and emesis. Pt has hx of DM. He also c/o bilateral feet numbness. Pt's sx started 3 days ago with abd pain. No BM since then. Pt is passing flatus. There is anorexia and nausea, with emesis x 2 - with blood streaks.  Pt has his blood sugar maintained ok. No chest pain, dib. No bloody stools that he can think of. Pt also has bilateral feet pain - described as burning type pain. Pain started > 1 week ago. Constant. Worse with ambulation.  Patient is a 43 y.o. male presenting with abdominal pain. The history is provided by the patient.  Abdominal Pain Associated symptoms: nausea   Associated symptoms: no chest pain, no cough, no dysuria, no shortness of breath and no vomiting     Past Medical History  Diagnosis Date  . Diabetes mellitus type II, uncontrolled   . Hypertension   . Tobacco use   . Hyperlipidemia   . Other and unspecified angina pectoris    Past Surgical History  Procedure Laterality Date  . Spinal tap     Family History  Problem Relation Age of Onset  . Cancer Mother   . Diabetes Father   . Hypertension Father    History  Substance Use Topics  . Smoking status: Current Every Day Smoker -- 1.00 packs/day for 20 years    Types: Cigarettes  . Smokeless tobacco: Never Used  . Alcohol Use: Yes     Comment: occ    Review of Systems  Constitutional: Negative for activity change and appetite change.  Respiratory: Negative for cough and shortness of breath.   Cardiovascular: Negative for chest pain.  Gastrointestinal: Positive for nausea, abdominal pain and blood in stool. Negative for vomiting.  Genitourinary: Negative  for dysuria.  Neurological: Positive for numbness.      Allergies  Bee venom and Penicillins  Home Medications   Prior to Admission medications   Medication Sig Start Date End Date Taking? Authorizing Provider  amLODipine (NORVASC) 5 MG tablet Take 1 tablet (5 mg total) by mouth daily. 12/02/13  Yes Malvin Johns, MD  atorvastatin (LIPITOR) 20 MG tablet Take 1 tablet (20 mg total) by mouth daily. 12/02/13  Yes Malvin Johns, MD  Cyanocobalamin (VITAMIN B-12 PO) Take 1 capsule by mouth daily.   Yes Historical Provider, MD  Insulin Detemir (LEVEMIR FLEXPEN) 100 UNIT/ML Pen Inject 30 Units into the skin at bedtime. 12/02/13  Yes Malvin Johns, MD  insulin lispro (HUMALOG KWIKPEN) 100 UNIT/ML KiwkPen Use 10 units 10-15 minutes before your evening meal 12/02/13  Yes Malvin Johns, MD  lisinopril (PRINIVIL,ZESTRIL) 20 MG tablet Take 1 tablet (20 mg total) by mouth daily. 12/02/13  Yes Malvin Johns, MD  metFORMIN (GLUCOPHAGE) 500 MG tablet Take 1 tablet (500 mg total) by mouth 2 (two) times daily with a meal. 12/02/13  Yes Malvin Johns, MD  omeprazole (PRILOSEC) 20 MG capsule Take 1 capsule (20 mg total) by mouth daily. 12/02/13  Yes Malvin Johns, MD  doxycycline (VIBRAMYCIN) 100 MG capsule Take 1 capsule (100 mg total) by mouth 2 (two) times daily. One po bid x 7 days Patient not taking: Reported on 03/06/2014 12/02/13  Malvin Johns, MD  gabapentin (NEURONTIN) 300 MG capsule Take 1 capsule (300 mg total) by mouth 3 (three) times daily. 03/07/14   Varney Biles, MD  mupirocin cream (BACTROBAN) 2 % Apply 1 application topically 2 (two) times daily. Patient not taking: Reported on 03/06/2014 12/02/13   Malvin Johns, MD  nitroGLYCERIN (NITROSTAT) 0.4 MG SL tablet Place 1 tablet (0.4 mg total) under the tongue every 5 (five) minutes as needed for chest pain. Patient not taking: Reported on 03/06/2014 07/26/13   Jettie Booze, MD  omeprazole (PRILOSEC) 20 MG capsule Take 1 capsule (20 mg total) by mouth  daily. 03/06/14   Varney Biles, MD  traMADol (ULTRAM) 50 MG tablet Take 1 tablet (50 mg total) by mouth every 6 (six) hours as needed. 03/06/14   Ardell Aaronson, MD   BP 162/105 mmHg  Pulse 104  Temp(Src) 97.8 F (36.6 C) (Oral)  Resp 14  SpO2 100% Physical Exam  Constitutional: He is oriented to person, place, and time. He appears well-developed.  HENT:  Head: Normocephalic and atraumatic.  Eyes: Conjunctivae and EOM are normal. Pupils are equal, round, and reactive to light.  Neck: Normal range of motion. Neck supple.  Cardiovascular: Normal rate and regular rhythm.   Pulmonary/Chest: Effort normal and breath sounds normal.  Abdominal: Soft. Bowel sounds are normal. He exhibits no distension. There is tenderness. There is no rebound and no guarding.  Epigastric abd pain  Musculoskeletal:  Reproducible tenderness at the plantar aspect of the feet. Skin is warm to touch.  Neurological: He is alert and oriented to person, place, and time.  Skin: Skin is warm.  Nursing note and vitals reviewed.   ED Course  Procedures (including critical care time) Labs Review Labs Reviewed  CBC WITH DIFFERENTIAL - Abnormal; Notable for the following:    WBC 10.6 (*)    All other components within normal limits  COMPREHENSIVE METABOLIC PANEL - Abnormal; Notable for the following:    Glucose, Bld 300 (*)    Albumin 3.4 (*)    Total Bilirubin <0.2 (*)    All other components within normal limits  CBG MONITORING, ED - Abnormal; Notable for the following:    Glucose-Capillary 285 (*)    All other components within normal limits  LIPASE, BLOOD  POC OCCULT BLOOD, ED    Imaging Review Dg Abd Acute W/chest  03/06/2014   CLINICAL DATA:  Initial encounter for vomiting.  EXAM: ACUTE ABDOMEN SERIES (ABDOMEN 2 VIEW & CHEST 1 VIEW)  COMPARISON:  CT scan from 07/10/2013.  FINDINGS: The lungs are clear without focal consolidation, edema, effusion or pneumothorax. Cardio pericardial silhouette is within  normal limits for size. Imaged bony structures of the thorax are intact.  Insert upper RA new gaseous bowel dilatation to suggest obstruction. No unexpected abdominal pelvic calcification. Multiple repair phleboliths are seen over the lower pelvis. Visualized bony structures are unremarkable.  IMPRESSION: Negative abdominal radiographs.  No acute cardiopulmonary disease.   Electronically Signed   By: Misty Stanley M.D.   On: 03/06/2014 13:53     EKG Interpretation None      MDM   Final diagnoses:  Abdominal pain  Gastritis  Hematemesis without nausea  Hematochezia  Neuropathy    Pt comes in with cc of of abd pain, nausea, bloody emesis.   DDx includes: Pancreatitis Hepatobiliary pathology including cholecystitis Gastritis/PUD SBO ACS syndrome  AAS ordered, as pt has had no BM. No signs of SBO. Pt's GI labs are normal. Has  epigastric pain - and we have given him GI f.u recs, as it appears that gastritis is possible. No chest pain, dib, and GI sx present for more than 3 days-  ACS unlikely. For the feet - we will start gabapentin. It appears that pt has neuropathy. [facility] outpatient respurced provided.     Varney Biles, MD 03/07/14 [id number]

## 2014-03-07 ENCOUNTER — Encounter (HOSPITAL_COMMUNITY): Payer: Self-pay | Admitting: Interventional Cardiology

## 2014-07-12 ENCOUNTER — Emergency Department (HOSPITAL_COMMUNITY)
Admission: EM | Admit: 2014-07-12 | Discharge: 2014-07-12 | Disposition: A | Discharge status: Home / Self Care | Payer: Self-pay | Attending: Emergency Medicine | Admitting: Emergency Medicine

## 2014-07-12 ENCOUNTER — Encounter (HOSPITAL_COMMUNITY): Payer: Self-pay

## 2014-07-12 DIAGNOSIS — Z720 Tobacco use: Secondary | ICD-10-CM | POA: Insufficient documentation

## 2014-07-12 DIAGNOSIS — E1343 Other specified diabetes mellitus with diabetic autonomic (poly)neuropathy: Secondary | ICD-10-CM

## 2014-07-12 DIAGNOSIS — Z792 Long term (current) use of antibiotics: Secondary | ICD-10-CM | POA: Insufficient documentation

## 2014-07-12 DIAGNOSIS — E1143 Type 2 diabetes mellitus with diabetic autonomic (poly)neuropathy: Secondary | ICD-10-CM | POA: Insufficient documentation

## 2014-07-12 DIAGNOSIS — I10 Essential (primary) hypertension: Secondary | ICD-10-CM | POA: Insufficient documentation

## 2014-07-12 DIAGNOSIS — E785 Hyperlipidemia, unspecified: Secondary | ICD-10-CM | POA: Insufficient documentation

## 2014-07-12 DIAGNOSIS — Z79899 Other long term (current) drug therapy: Secondary | ICD-10-CM | POA: Insufficient documentation

## 2014-07-12 DIAGNOSIS — E138 Other specified diabetes mellitus with unspecified complications: Secondary | ICD-10-CM

## 2014-07-12 DIAGNOSIS — Z880 Allergy status to penicillin: Secondary | ICD-10-CM | POA: Insufficient documentation

## 2014-07-12 DIAGNOSIS — Z794 Long term (current) use of insulin: Secondary | ICD-10-CM | POA: Insufficient documentation

## 2014-07-12 LAB — CBC
HCT: 47.3 % (ref 39.0–52.0)
Hemoglobin: 15.8 g/dL (ref 13.0–17.0)
MCH: 28.7 pg (ref 26.0–34.0)
MCHC: 33.4 g/dL (ref 30.0–36.0)
MCV: 86 fL (ref 78.0–100.0)
Platelets: 242 10*3/uL (ref 150–400)
RBC: 5.5 MIL/uL (ref 4.22–5.81)
RDW: 13.7 % (ref 11.5–15.5)
WBC: 9.3 10*3/uL (ref 4.0–10.5)

## 2014-07-12 LAB — I-STAT TROPONIN, ED: Troponin i, poc: 0 ng/mL (ref 0.00–0.08)

## 2014-07-12 LAB — BASIC METABOLIC PANEL
Anion gap: 9 (ref 5–15)
BUN: 12 mg/dL (ref 6–23)
CHLORIDE: 97 mmol/L (ref 96–112)
CO2: 27 mmol/L (ref 19–32)
Calcium: 9.6 mg/dL (ref 8.4–10.5)
Creatinine, Ser: 1.05 mg/dL (ref 0.50–1.35)
GFR calc Af Amer: >90 mL/min (ref >90)
GFR, EST NON AFRICAN AMERICAN: 85 mL/min — AB (ref >90)
GLUCOSE: 446 mg/dL — AB (ref 70–99)
Potassium: 4.6 mmol/L (ref 3.5–5.1)
SODIUM: 133 mmol/L — AB (ref 135–145)

## 2014-07-12 LAB — RAPID URINE DRUG SCREEN, HOSP PERFORMED
Amphetamines: NOT DETECTED
BARBITURATES: NOT DETECTED
BENZODIAZEPINES: NOT DETECTED
Cocaine: NOT DETECTED
Opiates: NOT DETECTED
Tetrahydrocannabinol: NOT DETECTED

## 2014-07-12 LAB — POC OCCULT BLOOD, ED: Fecal Occult Bld: NEGATIVE

## 2014-07-12 MED ORDER — GABAPENTIN 300 MG PO CAPS: 300.0000 mg | ORAL_CAPSULE | Freq: Three times a day (TID) | ORAL | Status: DC

## 2014-07-12 MED ORDER — INSULIN DETEMIR 100 UNIT/ML FLEXPEN: 30.0000 [IU] | PEN_INJECTOR | Freq: Every day | SUBCUTANEOUS | Status: DC

## 2014-07-12 MED ORDER — AMLODIPINE BESYLATE 5 MG PO TABS: 5.0000 mg | ORAL_TABLET | Freq: Every day | ORAL | Status: DC

## 2014-07-12 MED ORDER — FENTANYL CITRATE (PF) 100 MCG/2ML IJ SOLN
100.0000 ug | Freq: Once | INTRAMUSCULAR | Status: AC
  Administered 2014-07-12: 100 ug via INTRAVENOUS
  Filled 2014-07-12: qty 2

## 2014-07-12 MED ORDER — METFORMIN HCL 500 MG PO TABS: 500.0000 mg | ORAL_TABLET | Freq: Two times a day (BID) | ORAL | Status: DC

## 2014-07-12 MED ORDER — LISINOPRIL 20 MG PO TABS: 20.0000 mg | ORAL_TABLET | Freq: Every day | ORAL | Status: DC

## 2014-07-12 MED ORDER — INSULIN LISPRO 100 UNIT/ML (KWIKPEN): PEN_INJECTOR | SUBCUTANEOUS | Status: DC

## 2014-07-12 NOTE — ED Notes (Addendum)
Pt presents with 2 day h/o L sided chest pain.  Pt reports pain is constant and intermittently radiates into L arm.  +shortness of breath and nausea.  Last cath x 1 year ago with 30% blockage. Pt also reports vomiting bright red blood and noting bright red blood in stool x 2 days with onset of L abdominal pain.  Pt also reports "sores" on both feet, requesting to be checked due to diabetes.

## 2014-07-12 NOTE — ED Notes (Signed)
Pt given glass of water.  

## 2014-07-12 NOTE — ED Provider Notes (Signed)
CSN: [id number]     Arrival date & time 07/12/14  1116 History   First MD Initiated Contact with Patient 07/12/14 1156     No chief complaint on file.     HPI Pt presents with 2 day h/o L sided chest pain. Pt reports pain is constant and intermittently radiates into L arm. +shortness of breath and nausea. Last cath x 1 year ago with 30% blockage. Pt also reports vomiting bright red blood and noting bright red blood in stool x 2 days with onset of L abdominal pain. Pt also reports "sores" on both feet, requesting to be checked due to diabetes Past Medical History  Diagnosis Date  . Diabetes mellitus type II, uncontrolled   . Hypertension   . Tobacco use   . Hyperlipidemia   . Other and unspecified angina pectoris    Past Surgical History  Procedure Laterality Date  . Spinal tap    . Left heart catheterization with coronary angiogram N/A 07/31/2013    Procedure: LEFT HEART CATHETERIZATION WITH CORONARY ANGIOGRAM;  Surgeon: Jettie Booze, MD;  Location: [facility] CATH LAB;  Service: Cardiovascular;  Laterality: N/A;   Family History  Problem Relation Age of Onset  . Cancer Mother   . Diabetes Father   . Hypertension Father    History  Substance Use Topics  . Smoking status: Current Every Day Smoker -- 1.00 packs/day for 20 years    Types: Cigarettes  . Smokeless tobacco: Never Used  . Alcohol Use: Yes     Comment: occ    Review of Systems    Allergies  Bee venom and Penicillins  Home Medications   Prior to Admission medications   Medication Sig Start Date End Date Taking? Authorizing Provider  amLODipine (NORVASC) 5 MG tablet Take 1 tablet (5 mg total) by mouth daily. 07/12/14   Leonard Schwartz, MD  atorvastatin (LIPITOR) 20 MG tablet Take 1 tablet (20 mg total) by mouth daily. 12/02/13   Malvin Johns, MD  Cyanocobalamin (VITAMIN B-12 PO) Take 1 capsule by mouth daily.    Historical Provider, MD  doxycycline (VIBRAMYCIN) 100 MG capsule Take 1 capsule (100 mg total) by  mouth 2 (two) times daily. One po bid x 7 days Patient not taking: Reported on 03/06/2014 12/02/13   Malvin Johns, MD  gabapentin (NEURONTIN) 300 MG capsule Take 1 capsule (300 mg total) by mouth 3 (three) times daily. 07/12/14   Leonard Schwartz, MD  Insulin Detemir (LEVEMIR FLEXPEN) 100 UNIT/ML Pen Inject 30 Units into the skin at bedtime. 07/12/14   Leonard Schwartz, MD  insulin lispro (HUMALOG KWIKPEN) 100 UNIT/ML KiwkPen Use 10 units 10-15 minutes before your evening meal 07/12/14   Leonard Schwartz, MD  lisinopril (PRINIVIL,ZESTRIL) 20 MG tablet Take 1 tablet (20 mg total) by mouth daily. 07/12/14   Leonard Schwartz, MD  metFORMIN (GLUCOPHAGE) 500 MG tablet Take 1 tablet (500 mg total) by mouth 2 (two) times daily with a meal. 07/12/14   Leonard Schwartz, MD  mupirocin cream (BACTROBAN) 2 % Apply 1 application topically 2 (two) times daily. Patient not taking: Reported on 03/06/2014 12/02/13   Malvin Johns, MD  nitroGLYCERIN (NITROSTAT) 0.4 MG SL tablet Place 1 tablet (0.4 mg total) under the tongue every 5 (five) minutes as needed for chest pain. Patient not taking: Reported on 03/06/2014 07/26/13   Jettie Booze, MD  omeprazole (PRILOSEC) 20 MG capsule Take 1 capsule (20 mg total) by mouth daily. 12/02/13   Malvin Johns, MD  omeprazole (  PRILOSEC) 20 MG capsule Take 1 capsule (20 mg total) by mouth daily. 03/06/14   Varney Biles, MD  traMADol (ULTRAM) 50 MG tablet Take 1 tablet (50 mg total) by mouth every 6 (six) hours as needed. 03/06/14   Ankit Kathrynn Humble, MD   BP 115/85 mmHg  Pulse 99  Temp(Src) 97.5 F (36.4 C) (Oral)  Resp 16  Ht 5\' 5"  (1.651 m)  Wt 174 lb (78.926 kg)  BMI 28.96 kg/m2  SpO2 98% Physical Exam Physical Exam  Nursing note and vitals reviewed. Constitutional: He is oriented to person, place, and time. He appears well-developed and well-nourished. No distress.  HENT:  Head: Normocephalic and atraumatic.  Eyes: Pupils are equal, round, and reactive to light.  Neck: Normal range of  motion.  Cardiovascular: Normal rate and intact distal pulses.   Pulmonary/Chest: No respiratory distress.  Abdominal: Normal appearance. He exhibits no distension.  Musculoskeletal: Normal range of motion.  Neurological: He is alert and oriented to person, place, and time. No cranial nerve deficit.  Skin: Skin is warm and dry. No rash noted.  Psychiatric: He has a normal mood and affect. His behavior is normal.   ED Course  Procedures (including critical care time) Labs Review Labs Reviewed  BASIC METABOLIC PANEL - Abnormal; Notable for the following:    Sodium 133 (*)    Glucose, Bld 446 (*)    GFR calc non Af Amer 85 (*)    All other components within normal limits  CBC  URINE RAPID DRUG SCREEN (HOSP PERFORMED)  I-STAT TROPOININ, ED  POC OCCULT BLOOD, ED    Imaging Review No results found.   EKG Interpretation   Date/Time:  Friday July 12 2014 11:21:39 EDT Ventricular Rate:  121 PR Interval:  128 QRS Duration: 74 QT Interval:  304 QTC Calculation: 431 R Axis:   90 Text Interpretation:  Sinus tachycardia Biatrial enlargement Rightward  axis Abnormal ECG Confirmed by Aayla Marrocco  MD, Jonty Morrical ([id number]) on 07/12/2014  11:55:07 AM     Patient is out of his home medications for the last several months.  States he feels back to baseline and wants to go home. MDM   Final diagnoses:  Other specified diabetes mellitus with unspecified complications  Diabetic autonomic neuropathy associated with other specified diabetes mellitus        Leonard Schwartz, MD 07/14/14 2148

## 2014-07-12 NOTE — ED Notes (Signed)
Dr Beaton at bedside 

## 2014-07-12 NOTE — Discharge Instructions (Signed)
Diabetic Neuropathy Diabetic neuropathy is a nerve disease or nerve damage that is caused by diabetes mellitus. About half of all people with diabetes mellitus have some form of nerve damage. Nerve damage is more common in those who have had diabetes mellitus for many years and who generally have not had good control of their blood sugar (glucose) level. Diabetic neuropathy is a common complication of diabetes mellitus. There are three more common types of diabetic neuropathy and a fourth type that is less common and less understood:   Peripheral neuropathy--This is the most common type of diabetic neuropathy. It causes damage to the nerves of the feet and legs first and then eventually the hands and arms.The damage affects the ability to sense touch.  Autonomic neuropathy--This type causes damage to the autonomic nervous system, which controls the following functions:  Heartbeat.  Body temperature.  Blood pressure.  Urination.  Digestion.  Sweating.  Sexual function.  Focal neuropathy--Focal neuropathy can be painful and unpredictable and occurs most often in older adults with diabetes mellitus. It involves a specific nerve or one area and often comes on suddenly. It usually does not cause long-term problems.  Radiculoplexus neuropathy-- Sometimes called lumbosacral radiculoplexus neuropathy, radiculoplexus neuropathy affects the nerves of the thighs, hips, buttocks, or legs. It is more common in people with type 2 diabetes mellitus and in older men. It is characterized by debilitating pain, weakness, and atrophy, usually in the thigh muscles. CAUSES  The cause of peripheral, autonomic, and focal neuropathies is diabetes mellitus that is uncontrolled and high glucose levels. The cause of radiculoplexus neuropathy is unknown. However, it is thought to be caused by inflammation related to uncontrolled glucose levels. SIGNS AND SYMPTOMS  Peripheral Neuropathy Peripheral neuropathy develops  slowly over time. When the nerves of the feet and legs no longer work there may be:   Burning, stabbing, or aching pain in the legs or feet.  Inability to feel pressure or pain in your feet. This can lead to:  Thick calluses over pressure areas.  Pressure sores.  Ulcers.  Foot deformities.  Reduced ability to feel temperature changes.  Muscle weakness. Autonomic Neuropathy The symptoms of autonomic neuropathy vary depending on which nerves are affected. Symptoms may include:  Problems with digestion, such as:  Feeling sick to your stomach (nausea).  Vomiting.  Bloating.  Constipation.  Diarrhea.  Abdominal pain.  Difficulty with urination. This occurs if you lose your ability to sense when your bladder is full. Problems include:  Urine leakage (incontinence).  Inability to empty your bladder completely (retention).  Rapid or irregular heartbeat (palpitations).  Blood pressure drops when you stand up (orthostatic hypotension). When you stand up you may feel:  Dizzy.  Weak.  Faint.  In men, inability to attain and maintain an erection.  In women, vaginal dryness and problems with decreased sexual desire and arousal.  Problems with body temperature regulation.  Increased or decreased sweating. Focal Neuropathy  Abnormal eye movements or abnormal alignment of both eyes.  Weakness in the wrist.  Foot drop. This results in an inability to lift the foot properly and abnormal walking or foot movement.  Paralysis on one side of your face (Bell palsy).  Chest or abdominal pain. Radiculoplexus Neuropathy  Sudden, severe pain in your hip, thigh, or buttocks.  Weakness and wasting of thigh muscles.  Difficulty rising from a seated position.  Abdominal swelling.  Unexplained weight loss (usually more than 10 lb [4.5 kg]). DIAGNOSIS  Peripheral Neuropathy Your senses may   be tested. Sensory function testing can be done with:  A light touch using a  monofilament.  A vibration with tuning fork.  A sharp sensation with a pin prick. Other tests that can help diagnose neuropathy are:  Nerve conduction velocity. This test checks the transmission of an electrical current through a nerve.  Electromyography. This shows how muscles respond to electrical signals transmitted by nearby nerves.  Quantitative sensory testing. This is used to assess how your nerves respond to vibrations and changes in temperature. Autonomic Neuropathy Diagnosis is often based on reported symptoms. Tell your health care provider if you experience:   Dizziness.   Constipation.   Diarrhea.   Inappropriate urination or inability to urinate.   Inability to get or maintain an erection.  Tests that may be done include:   Electrocardiography or Holter monitor. These are tests that can help show problems with the heart rate or heart rhythm.   An X-ray exam may be done. Focal Neuropathy Diagnosis is made based on your symptoms and what your health care provider finds during your exam. Other tests may be done. They may include:  Nerve conduction velocities. This checks the transmission of electrical current through a nerve.  Electromyography. This shows how muscles respond to electrical signals transmitted by nearby nerves.  Quantitative sensory testing. This test is used to assess how your nerves respond to vibration and changes in temperature. Radiculoplexus Neuropathy  Often the first thing is to eliminate any other issue or problems that might be the cause, as there is no stick test for diagnosis.  X-ray exam of your spine and lumbar region.  Spinal tap to rule out cancer.  MRI to rule out other lesions. TREATMENT  Once nerve damage occurs, it cannot be reversed. The goal of treatment is to keep the disease or nerve damage from getting worse and affecting more nerve fibers. Controlling your blood glucose level is the key. Most people with  radiculoplexus neuropathy see at least a partial improvement over time. You will need to keep your blood glucose and HbA1c levels in the target range determined by your health care provider. Things that help control blood glucose levels include:   Blood glucose monitoring.   Meal planning.   Physical activity.   Diabetes medicine.  Over time, maintaining lower blood glucose levels helps lessen symptoms. Sometimes, prescription pain medicine is needed. HOME CARE INSTRUCTIONS:  Do not smoke.  Keep your blood glucose level in the range that you and your health care provider have determined acceptable for you.  Keep your blood pressure level in the range that you and your health care provider have determined acceptable for you.  Eat a well-balanced diet.  Be active every day.  Check your feet every day. SEEK MEDICAL CARE IF:   You have burning, stabbing, or aching pain in the legs or feet.  You are unable to feel pressure or pain in your feet.  You develop problems with digestion such as:  Nausea.  Vomiting.  Bloating.  Constipation.  Diarrhea.  Abdominal pain.  You have difficulty with urination, such as:  Incontinence.  Retention.  You have palpitations.  You develop orthostatic hypotension. When you stand up you may feel:  Dizzy.  Weak.  Faint.  You cannot attain and maintain an erection (in men).  You have vaginal dryness and problems with decreased sexual desire and arousal (in women).  You have severe pain in your thighs, legs, or buttocks.  You have unexplained weight loss.   Document Released: 05/24/2001 Document Revised: 01/03/2013 Document Reviewed: 08/24/2012 [employer] Patient Information 2015 [employer]. This information is not intended to replace advice given to you by your health care provider. Make sure you discuss any questions you have with your health care provider. 

## 2015-07-07 DIAGNOSIS — G629 Polyneuropathy, unspecified: Secondary | ICD-10-CM | POA: Insufficient documentation

## 2015-10-06 IMAGING — CR DG CHEST 2V
2 series · 2 of 2 positions shown · non-contrast
Comparison: DG CHEST 2 VIEW dated 07/24/2012

CLINICAL DATA: CHEST PAIN

EXAM:
CHEST  2 VIEW

[w chest pa]
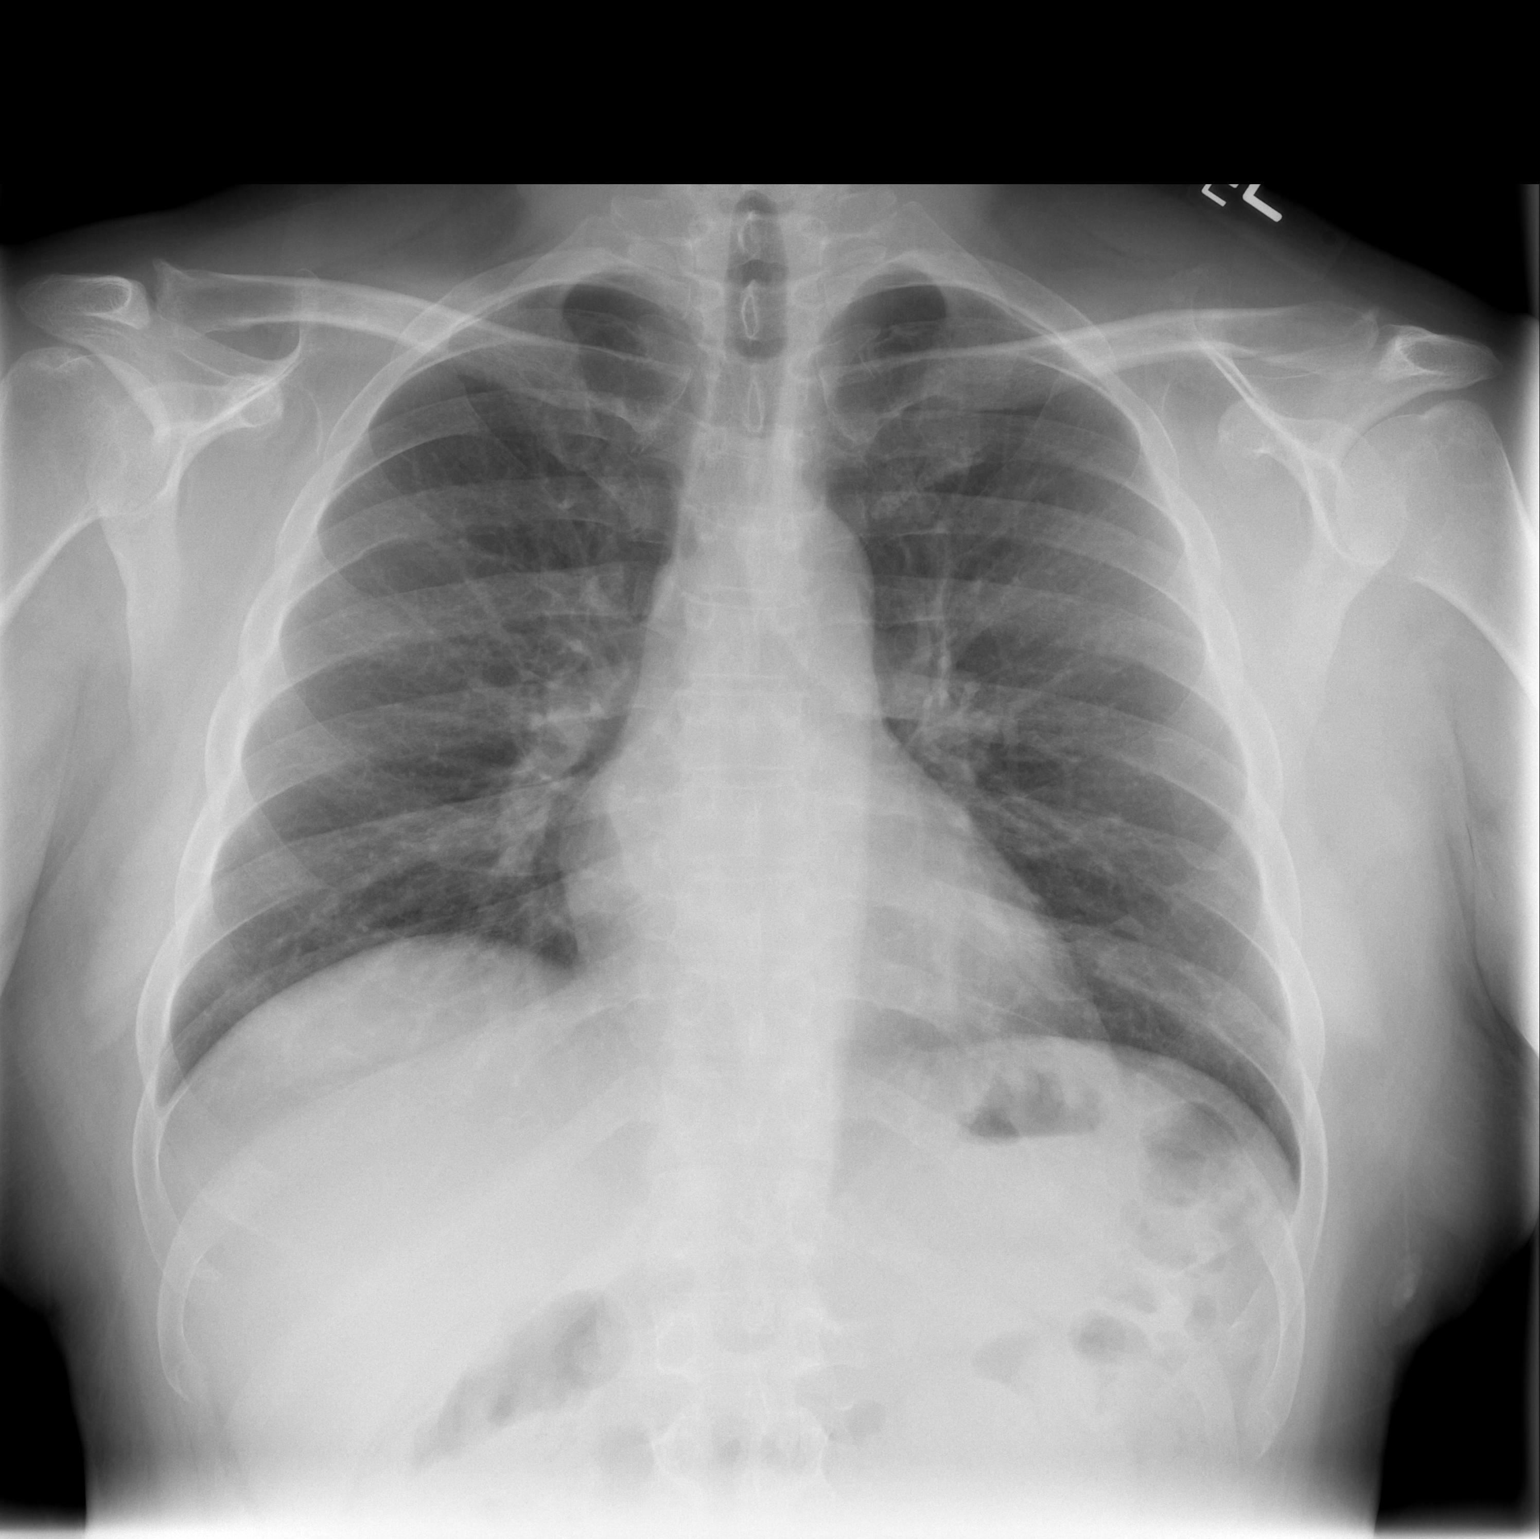

[w chest lat]
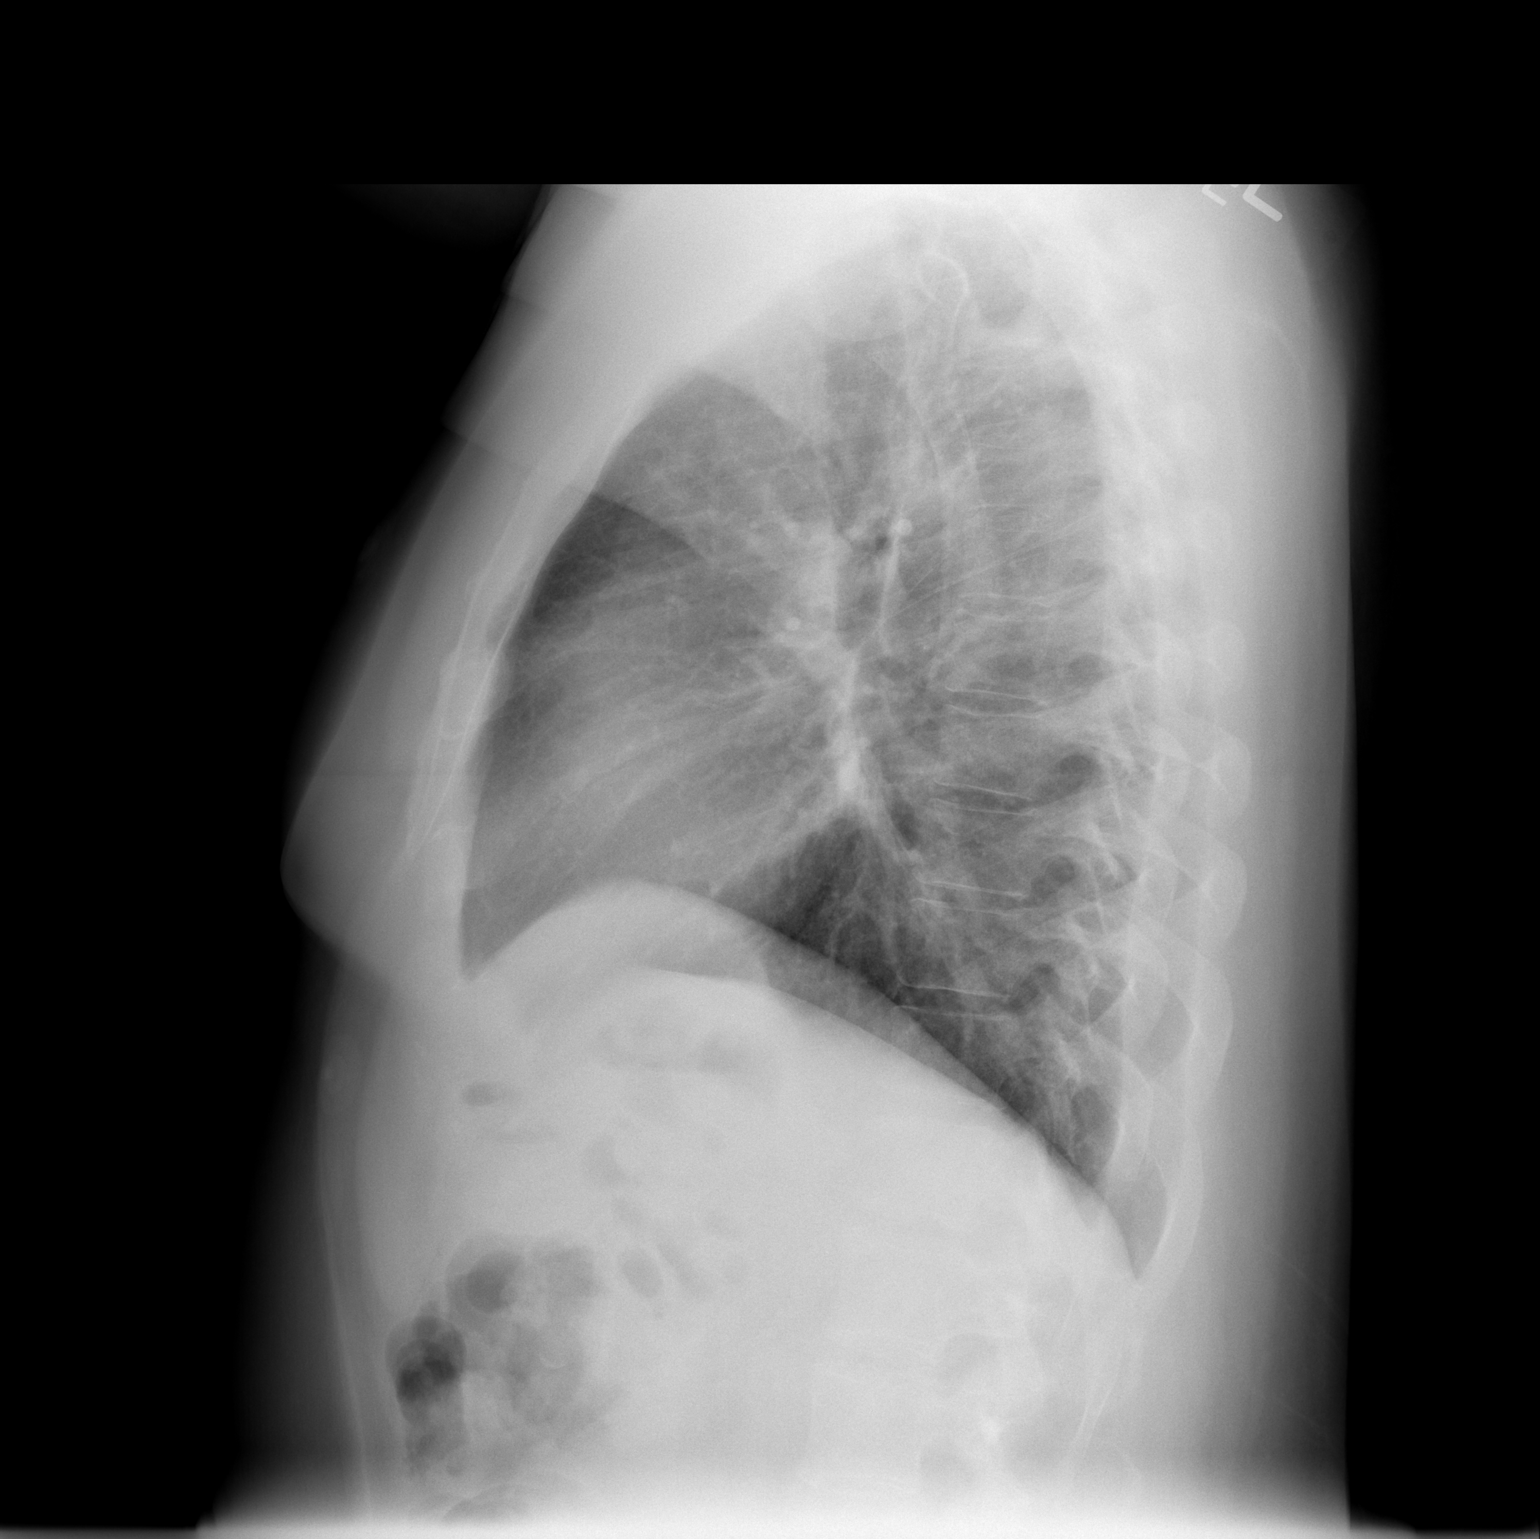

[2 of 2 positions shown; findings below may reference images not displayed]

FINDINGS: The heart size and mediastinal contours are within normal limits.
Both lungs are clear. The visualized skeletal structures are
unremarkable.
IMPRESSION: No active cardiopulmonary disease.

## 2015-10-13 DIAGNOSIS — L732 Hidradenitis suppurativa: Secondary | ICD-10-CM | POA: Insufficient documentation

## 2016-08-20 ENCOUNTER — Inpatient Hospital Stay (HOSPITAL_COMMUNITY)
Admission: EM | Admit: 2016-08-20 | Discharge: 2016-08-25 | DRG: 617 | Disposition: A | Discharge status: Home / Self Care | Payer: Self-pay | Attending: Internal Medicine | Admitting: Internal Medicine

## 2016-08-20 ENCOUNTER — Emergency Department (HOSPITAL_COMMUNITY): Payer: Self-pay

## 2016-08-20 ENCOUNTER — Encounter (HOSPITAL_COMMUNITY): Payer: Self-pay

## 2016-08-20 DIAGNOSIS — I10 Essential (primary) hypertension: Secondary | ICD-10-CM | POA: Diagnosis present

## 2016-08-20 DIAGNOSIS — M869 Osteomyelitis, unspecified: Secondary | ICD-10-CM

## 2016-08-20 DIAGNOSIS — Z91030 Bee allergy status: Secondary | ICD-10-CM

## 2016-08-20 DIAGNOSIS — E13621 Other specified diabetes mellitus with foot ulcer: Secondary | ICD-10-CM

## 2016-08-20 DIAGNOSIS — Z880 Allergy status to penicillin: Secondary | ICD-10-CM

## 2016-08-20 DIAGNOSIS — E1165 Type 2 diabetes mellitus with hyperglycemia: Secondary | ICD-10-CM | POA: Diagnosis present

## 2016-08-20 DIAGNOSIS — Z794 Long term (current) use of insulin: Secondary | ICD-10-CM

## 2016-08-20 DIAGNOSIS — M86472 Chronic osteomyelitis with draining sinus, left ankle and foot: Secondary | ICD-10-CM

## 2016-08-20 DIAGNOSIS — F17210 Nicotine dependence, cigarettes, uncomplicated: Secondary | ICD-10-CM | POA: Diagnosis present

## 2016-08-20 DIAGNOSIS — M86672 Other chronic osteomyelitis, left ankle and foot: Secondary | ICD-10-CM | POA: Diagnosis present

## 2016-08-20 DIAGNOSIS — L97509 Non-pressure chronic ulcer of other part of unspecified foot with unspecified severity: Secondary | ICD-10-CM

## 2016-08-20 DIAGNOSIS — Z79899 Other long term (current) drug therapy: Secondary | ICD-10-CM

## 2016-08-20 DIAGNOSIS — E785 Hyperlipidemia, unspecified: Secondary | ICD-10-CM | POA: Diagnosis present

## 2016-08-20 DIAGNOSIS — L97529 Non-pressure chronic ulcer of other part of left foot with unspecified severity: Secondary | ICD-10-CM | POA: Diagnosis present

## 2016-08-20 DIAGNOSIS — B351 Tinea unguium: Secondary | ICD-10-CM | POA: Diagnosis present

## 2016-08-20 DIAGNOSIS — E11621 Type 2 diabetes mellitus with foot ulcer: Secondary | ICD-10-CM | POA: Diagnosis present

## 2016-08-20 DIAGNOSIS — E1149 Type 2 diabetes mellitus with other diabetic neurological complication: Secondary | ICD-10-CM | POA: Diagnosis present

## 2016-08-20 DIAGNOSIS — K5900 Constipation, unspecified: Secondary | ICD-10-CM | POA: Diagnosis not present

## 2016-08-20 DIAGNOSIS — Z7982 Long term (current) use of aspirin: Secondary | ICD-10-CM

## 2016-08-20 DIAGNOSIS — IMO0002 Reserved for concepts with insufficient information to code with codable children: Secondary | ICD-10-CM | POA: Diagnosis present

## 2016-08-20 DIAGNOSIS — K219 Gastro-esophageal reflux disease without esophagitis: Secondary | ICD-10-CM | POA: Diagnosis present

## 2016-08-20 DIAGNOSIS — E1169 Type 2 diabetes mellitus with other specified complication: Principal | ICD-10-CM | POA: Diagnosis present

## 2016-08-20 HISTORY — DX: Non-pressure chronic ulcer of other part of unspecified foot with unspecified severity: E13.621

## 2016-08-20 LAB — CBC WITH DIFFERENTIAL/PLATELET
BASOS ABS: 0 10*3/uL (ref 0.0–0.1)
BASOS PCT: 0 %
EOS ABS: 0.2 10*3/uL (ref 0.0–0.7)
EOS PCT: 2 %
HCT: 36.5 % — ABNORMAL LOW (ref 39.0–52.0)
Hemoglobin: 11.6 g/dL — ABNORMAL LOW (ref 13.0–17.0)
Lymphocytes Relative: 25 %
Lymphs Abs: 3.8 10*3/uL (ref 0.7–4.0)
MCH: 27.6 pg (ref 26.0–34.0)
MCHC: 31.8 g/dL (ref 30.0–36.0)
MCV: 86.9 fL (ref 78.0–100.0)
MONO ABS: 0.6 10*3/uL (ref 0.1–1.0)
Monocytes Relative: 4 %
Neutro Abs: 10.5 10*3/uL — ABNORMAL HIGH (ref 1.7–7.7)
Neutrophils Relative %: 69 %
PLATELETS: 300 10*3/uL (ref 150–400)
RBC: 4.2 MIL/uL — ABNORMAL LOW (ref 4.22–5.81)
RDW: 14.5 % (ref 11.5–15.5)
WBC: 15.1 10*3/uL — ABNORMAL HIGH (ref 4.0–10.5)

## 2016-08-20 LAB — URINALYSIS, ROUTINE W REFLEX MICROSCOPIC
Bilirubin Urine: NEGATIVE
Glucose, UA: 50 mg/dL — AB
Hgb urine dipstick: NEGATIVE
Ketones, ur: NEGATIVE mg/dL
Leukocytes, UA: NEGATIVE
Nitrite: NEGATIVE
PH: 5 (ref 5.0–8.0)
Protein, ur: >=300 mg/dL — AB
SQUAMOUS EPITHELIAL / LPF: NONE SEEN
Specific Gravity, Urine: 1.025 (ref 1.005–1.030)

## 2016-08-20 LAB — COMPREHENSIVE METABOLIC PANEL
ALBUMIN: 3.2 g/dL — AB (ref 3.5–5.0)
ALT: 13 U/L — ABNORMAL LOW (ref 17–63)
AST: 14 U/L — AB (ref 15–41)
Alkaline Phosphatase: 95 U/L (ref 38–126)
Anion gap: 6 (ref 5–15)
BILIRUBIN TOTAL: 0.2 mg/dL — AB (ref 0.3–1.2)
BUN: 15 mg/dL (ref 6–20)
CALCIUM: 8.7 mg/dL — AB (ref 8.9–10.3)
CO2: 27 mmol/L (ref 22–32)
Chloride: 104 mmol/L (ref 101–111)
Creatinine, Ser: 1.17 mg/dL (ref 0.61–1.24)
GFR calc Af Amer: >60 mL/min (ref >60)
GFR calc non Af Amer: >60 mL/min (ref >60)
GLUCOSE: 208 mg/dL — AB (ref 65–99)
Potassium: 3.9 mmol/L (ref 3.5–5.1)
Sodium: 137 mmol/L (ref 135–145)
TOTAL PROTEIN: 6.9 g/dL (ref 6.5–8.1)

## 2016-08-20 LAB — I-STAT CG4 LACTIC ACID, ED
Lactic Acid, Venous: 1.13 mmol/L (ref 0.5–1.9)
Lactic Acid, Venous: 0.89 mmol/L (ref 0.5–1.9)

## 2016-08-20 LAB — CBG MONITORING, ED: Glucose-Capillary: 190 mg/dL — ABNORMAL HIGH (ref 65–99)

## 2016-08-20 LAB — GLUCOSE, CAPILLARY: Glucose-Capillary: 141 mg/dL — ABNORMAL HIGH (ref 65–99)

## 2016-08-20 LAB — C-REACTIVE PROTEIN: CRP: 2.2 mg/dL — ABNORMAL HIGH (ref <1.0)

## 2016-08-20 LAB — SEDIMENTATION RATE: Sed Rate: 54 mm/hr — ABNORMAL HIGH (ref 0–16)

## 2016-08-20 MED ORDER — METRONIDAZOLE 500 MG PO TABS
500.0000 mg | ORAL_TABLET | Freq: Three times a day (TID) | ORAL | Status: DC
  Administered 2016-08-20 – 2016-08-24 (×10): 500 mg via ORAL
  Filled 2016-08-20 (×11): qty 1

## 2016-08-20 MED ORDER — CLINDAMYCIN PHOSPHATE 600 MG/50ML IV SOLN: 600.0000 mg | Freq: Three times a day (TID) | INTRAVENOUS | Status: DC

## 2016-08-20 MED ORDER — AMLODIPINE BESYLATE 5 MG PO TABS
5.0000 mg | ORAL_TABLET | Freq: Every day | ORAL | Status: DC
  Administered 2016-08-21 – 2016-08-25 (×5): 5 mg via ORAL
  Filled 2016-08-20 (×6): qty 1

## 2016-08-20 MED ORDER — SODIUM CHLORIDE 0.9 % IV BOLUS (SEPSIS)
1000.0000 mL | Freq: Once | INTRAVENOUS | Status: AC
  Administered 2016-08-20: 1000 mL via INTRAVENOUS

## 2016-08-20 MED ORDER — HEPARIN SODIUM (PORCINE) 5000 UNIT/ML IJ SOLN
5000.0000 [IU] | Freq: Three times a day (TID) | INTRAMUSCULAR | Status: DC
  Administered 2016-08-20 – 2016-08-24 (×7): 5000 [IU] via SUBCUTANEOUS
  Filled 2016-08-20 (×8): qty 1

## 2016-08-20 MED ORDER — DEXTROSE 5 % IV SOLN
2.0000 g | INTRAVENOUS | Status: DC
  Administered 2016-08-20 – 2016-08-22 (×3): 2 g via INTRAVENOUS
  Filled 2016-08-20 (×4): qty 2

## 2016-08-20 MED ORDER — NICOTINE 21 MG/24HR TD PT24
21.0000 mg | MEDICATED_PATCH | Freq: Every day | TRANSDERMAL | Status: DC
  Administered 2016-08-20 – 2016-08-25 (×6): 21 mg via TRANSDERMAL
  Filled 2016-08-20 (×6): qty 1

## 2016-08-20 MED ORDER — INSULIN ASPART 100 UNIT/ML ~~LOC~~ SOLN
0.0000 [IU] | Freq: Every day | SUBCUTANEOUS | Status: DC
Start: 2016-08-20 — End: 2016-08-25

## 2016-08-20 MED ORDER — ACETAMINOPHEN 325 MG PO TABS: 650.0000 mg | ORAL_TABLET | Freq: Four times a day (QID) | ORAL | Status: DC | PRN

## 2016-08-20 MED ORDER — INSULIN DETEMIR 100 UNIT/ML FLEXPEN: 15.0000 [IU] | PEN_INJECTOR | Freq: Every day | SUBCUTANEOUS | Status: DC

## 2016-08-20 MED ORDER — TERBINAFINE HCL 250 MG PO TABS
250.0000 mg | ORAL_TABLET | Freq: Every day | ORAL | Status: DC
  Administered 2016-08-21 – 2016-08-23 (×3): 250 mg via ORAL
  Filled 2016-08-20 (×4): qty 1

## 2016-08-20 MED ORDER — AMLODIPINE BESYLATE 5 MG PO TABS: 5.0000 mg | ORAL_TABLET | Freq: Every day | ORAL | Status: DC

## 2016-08-20 MED ORDER — LEVOFLOXACIN IN D5W 750 MG/150ML IV SOLN
750.0000 mg | Freq: Once | INTRAVENOUS | Status: AC
  Administered 2016-08-20: 750 mg via INTRAVENOUS
  Filled 2016-08-20: qty 150

## 2016-08-20 MED ORDER — KETOROLAC TROMETHAMINE 15 MG/ML IJ SOLN
15.0000 mg | Freq: Once | INTRAMUSCULAR | Status: AC
  Administered 2016-08-21: 15 mg via INTRAVENOUS
  Filled 2016-08-20: qty 1

## 2016-08-20 MED ORDER — LISINOPRIL 20 MG PO TABS
20.0000 mg | ORAL_TABLET | Freq: Every day | ORAL | Status: DC
Start: 2016-08-20 — End: 2016-08-20

## 2016-08-20 MED ORDER — GABAPENTIN 300 MG PO CAPS
300.0000 mg | ORAL_CAPSULE | Freq: Three times a day (TID) | ORAL | Status: DC
  Administered 2016-08-21 – 2016-08-25 (×12): 300 mg via ORAL
  Filled 2016-08-20 (×13): qty 1

## 2016-08-20 MED ORDER — INSULIN ASPART 100 UNIT/ML ~~LOC~~ SOLN
0.0000 [IU] | Freq: Three times a day (TID) | SUBCUTANEOUS | Status: DC
  Administered 2016-08-21: 2 [IU] via SUBCUTANEOUS
  Administered 2016-08-21: 5 [IU] via SUBCUTANEOUS
  Administered 2016-08-21: 2 [IU] via SUBCUTANEOUS
  Administered 2016-08-22: 3 [IU] via SUBCUTANEOUS
  Administered 2016-08-22: 5 [IU] via SUBCUTANEOUS
  Administered 2016-08-23: 3 [IU] via SUBCUTANEOUS
  Administered 2016-08-24: 2 [IU] via SUBCUTANEOUS
  Administered 2016-08-24: 3 [IU] via SUBCUTANEOUS

## 2016-08-20 MED ORDER — LISINOPRIL 10 MG PO TABS
10.0000 mg | ORAL_TABLET | Freq: Every day | ORAL | Status: DC
  Administered 2016-08-21 – 2016-08-25 (×5): 10 mg via ORAL
  Filled 2016-08-20 (×6): qty 1

## 2016-08-20 NOTE — H&P (Signed)
Date: 08/20/2016               Patient Name:  [name] MRN: [id number]  DOB: [birth year] Age / Sex: 46 y.o., male   PCP: Rosine Abe, DO         Medical Service: Internal Medicine Teaching Service         Attending Physician: Dr. Larey Dresser    First Contact: Dr. Minus Liberty Pager: [phone]  Second Contact: Dr. Ignacia Marvel Pager: [phone]       After Hours (After 5p/  First Contact Pager: [phone]  weekends / holidays): Second Contact Pager: [phone]   Chief Complaint: Toe infection  History of Present Illness: [name] is a 46 y.o. gentleman with PMH poorly controlled insulin-dependent T2DM, diabetic neuropathy, HLD, HTN, hidradenitis suppurativa, and GERD who presents with a painful and necrotic left great toe that has been progressively worsening over the last 2 months. His symptoms started with swelling, discoloration, and bad odor. He reports that it then "blistered up" under his toe nail and became more painful, and his toe nail is now almost falling off. The pain started two weeks ago.  He has kept it wrapped and took no medications for his symptoms. He has not check his blood sugars in a long time, and states the has not taken his insulin for months. He states that he ran out of his insulin and could not get it refilled. He denies fevers, chills, nausea, polyuria, polydipsia, or past experience with infections of this nature.   In the ED he was afebrile, HR 102, RR 18, BP 194/98, SpO2 100% on RA. Labs remarkable for WBC 15.1, Hb 11.6 (MCV 87), and glucose 208 with glucosuria. XR of the left toe showed soft tissue swelling and emphysema in the toe with no radiographic evidence of osteomyelitis. IMTS contacted for admission.   Meds:  No outpatient prescriptions have been marked as taking for the 08/20/16 encounter [facility] Encounter).   Allergies: Allergies as of 08/20/2016 - Review Complete 08/20/2016  Allergen Reaction Noted  . Bee venom  Swelling 07/24/2012  . Penicillins Hives 04/01/2011   Past Medical History:  Diagnosis Date  . Diabetes mellitus type II, uncontrolled ([place])   . Hyperlipidemia   . Hypertension   . Other and unspecified angina pectoris   . Tobacco use     Family History:  Family History  Problem Relation Age of Onset  . Cancer Mother   . Diabetes Father   . Hypertension Father     Social History:  Social History   Social History  . Marital status: Single    Spouse name: N/A  . Number of children: N/A  . Years of education: N/A   Occupational History  . 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 Smoking status: Current Every Day Smoker    Packs/day: 1.00    Years: 20.00    Types: Cigarettes  . Smokeless tobacco: Never Used  . Alcohol use Yes     Comment: occ  . Drug use: No  . Sexual activity: Not Currently   Other Topics Concern  . Not on file   Social History Narrative   Works in [employer], recently moved in early 2018 from [place]. Previous saw free clinic providers.     Review of Systems: A complete ROS was negative except as per HPI.   Physical Exam: Blood pressure Marland Kitchen)  165/89, pulse 85, temperature 98.9 F (37.2 C), resp. rate 18, SpO2 100 %.  General appearance: WDWN man resting comfortably in bed, in mild distress, conversational and polite HENT: Normocephalic, atraumatic, moist mucous membranes Eyes: PERRL, non-icteric Cardiovascular: Mildly tachycardic rate and regular rhythm, no murmurs, rubs, gallops Respiratory: Clear to auscultation bilaterally, normal work of breathing Abdomen: BS+, soft, non-tender, non-distended Extremities: Severe onychomycosis of all toenails, left great toe is dark, swollen with dry ulcer undermining his nailbed, nail is loose, underlying deep tissues visible, malodorous, tender to palpation and swelling extends to ankle, 1+ peripheral pulses Skin: Warm, dry Neuro: Alert and  oriented Psych: Appropriate affect, clear speech, thoughts linear and goal-directed  Assessment & Plan by Problem: Active Problems:   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 Patient with no systemic symptoms, leukocytosis, mild to moderate pain. Likely developed from severe onychomycosis.  -- MRI left foot w/o contrast -- Consult ortho surgery  -- Wound care consult -- Check ESR, CRP, blood cx, trend CBC -- Ceftriaxone 2g IV QD and Metronidazole 500 mg po Q8H -- Consider obtaining ABIs to assess for PVD  Uncontrolled insulin-dependent T2DM, last A1c in care everywhere 8.9 in 01/2016 but prior to that 12+. Has not taken his insulin in weeks/months. Prescribed Metformin 500 BID, Levemir 30 units QHS, and Humalog 10 units with dinner -- Check A1c -- CBG TID AC HS -- Levemir 15 units QHS -- SSI-M and HS coverage -- CM diet -- Gabapentin 300 mg TID for neuropathy  Onychomycosis, 100% of toenails compromised -- Terbinafine 250 mg for 12 weeks  HTN -- home Amlodipine 5 mg and Lisinopril 20 mg   Tobacco abuse, 1 ppd smoker for 20 years, desires to quit but unsucessful in attempts -- Nicotine patch  FEN/GI: CM diet, replete electrolytes as needed  DVT ppx: Heparin TID  Code status: Full code  Dispo: Admit patient to Observation with expected length of stay less than 2 midnights.  Signed: Asencion Partridge, MD 08/20/2016, 8:46 PM  Pager: [phone]

## 2016-08-20 NOTE — ED Triage Notes (Signed)
Pt presents with left greater toe infection. Toe appears black, swollen with visible underlying fascia. His toe nail is barely hanging on. This has been going on for 2 months. Pt is a diabetic.

## 2016-08-20 NOTE — ED Notes (Signed)
Attempted report x1. 

## 2016-08-20 NOTE — ED Notes (Signed)
Patient transported to MRI 

## 2016-08-20 NOTE — ED Provider Notes (Signed)
[facility] DEPT Provider Note   CSN: [id number] Arrival date & time: 08/20/16  1619     History   Chief Complaint Chief Complaint  Patient presents with  . Toe Pain    HPI BRENAN MODESTO is a 46 y.o. male.  The patient has had about 2 months of symptoms. Developed a wound on his left great toe. Over the last few weeks, has developed pain, swelling about the wound. Has not sought any medical care until now. Denies any fevers or chills. Denies any other systemic symptoms. Had persistence and worsening of symptoms so presented here.   The history is provided by the patient.  Illness  This is a new problem. Episode onset: 2 months. The problem occurs constantly. The problem has been gradually worsening. Pertinent negatives include no chest pain, no abdominal pain, no headaches and no shortness of breath. He has tried nothing for the symptoms.    Past Medical History:  Diagnosis Date  . Diabetes mellitus type II, uncontrolled ([place])   . Hyperlipidemia   . Hypertension   . Other and unspecified angina pectoris   . Tobacco use     Patient Active Problem List   Diagnosis Date Noted  . Foot ulcer due to secondary DM ([place]) 08/20/2016  . Coronary atherosclerosis of native coronary artery 08/17/2013  . Other and unspecified angina pectoris   . Chest pain 07/20/2013  . Diabetic retinopathy ([place]) 02/02/2013  . Stress reaction 12/01/2012  . Diabetic neuropathy ([place]) 12/01/2012  . Hypertension 08/03/2012  . Tobacco use disorder 08/03/2012  . Erectile dysfunction 08/03/2012  . Migraine 08/03/2012  . Diabetes mellitus with neurological manifestations, uncontrolled ([place]) 05/26/2006    Past Surgical History:  Procedure Laterality Date  . LEFT HEART CATHETERIZATION WITH CORONARY ANGIOGRAM N/A 07/31/2013   Procedure: LEFT HEART CATHETERIZATION WITH CORONARY ANGIOGRAM;  Surgeon: Jettie Booze, MD;  Location: [facility] CATH LAB;  Service: Cardiovascular;  Laterality: N/A;  . spinal  tap         Home Medications    Prior to Admission medications   Medication Sig Start Date End Date Taking? Authorizing Provider  amLODipine (NORVASC) 5 MG tablet Take 1 tablet (5 mg total) by mouth daily. 07/12/14   Leonard Schwartz, MD  atorvastatin (LIPITOR) 20 MG tablet Take 1 tablet (20 mg total) by mouth daily. 12/02/13   Malvin Johns, MD  Cyanocobalamin (VITAMIN B-12 PO) Take 1 capsule by mouth daily.    [provider]  doxycycline (VIBRAMYCIN) 100 MG capsule Take 1 capsule (100 mg total) by mouth 2 (two) times daily. One po bid x 7 days Patient not taking: Reported on 03/06/2014 12/02/13   Malvin Johns, MD  gabapentin (NEURONTIN) 300 MG capsule Take 1 capsule (300 mg total) by mouth 3 (three) times daily. 07/12/14   Leonard Schwartz, MD  Insulin Detemir (LEVEMIR FLEXPEN) 100 UNIT/ML Pen Inject 30 Units into the skin at bedtime. 07/12/14   Leonard Schwartz, MD  insulin lispro (HUMALOG KWIKPEN) 100 UNIT/ML KiwkPen Use 10 units 10-15 minutes before your evening meal 07/12/14   Leonard Schwartz, MD  lisinopril (PRINIVIL,ZESTRIL) 20 MG tablet Take 1 tablet (20 mg total) by mouth daily. 07/12/14   Leonard Schwartz, MD  metFORMIN (GLUCOPHAGE) 500 MG tablet Take 1 tablet (500 mg total) by mouth 2 (two) times daily with a meal. 07/12/14   Leonard Schwartz, MD  mupirocin cream (BACTROBAN) 2 % Apply 1 application topically 2 (two) times daily. Patient not taking: Reported on 03/06/2014 12/02/13  Malvin Johns, MD  nitroGLYCERIN (NITROSTAT) 0.4 MG SL tablet Place 1 tablet (0.4 mg total) under the tongue every 5 (five) minutes as needed for chest pain. Patient not taking: Reported on 03/06/2014 07/26/13   Jettie Booze, MD  omeprazole (PRILOSEC) 20 MG capsule Take 1 capsule (20 mg total) by mouth daily. 12/02/13   Malvin Johns, MD  omeprazole (PRILOSEC) 20 MG capsule Take 1 capsule (20 mg total) by mouth daily. 03/06/14   Varney Biles, MD  traMADol (ULTRAM) 50 MG tablet Take 1 tablet (50 mg total)  by mouth every 6 (six) hours as needed. 03/06/14   Varney Biles, MD    Family History Family History  Problem Relation Age of Onset  . Cancer Mother   . Diabetes Father   . Hypertension Father     Social History Social History  Substance Use Topics  . Smoking status: Current Every Day Smoker    Packs/day: 1.00    Years: 20.00    Types: Cigarettes  . Smokeless tobacco: Never Used  . Alcohol use Yes     Comment: occ     Allergies   Bee venom and Penicillins   Review of Systems Review of Systems  Constitutional: Positive for chills. Negative for fever.  Respiratory: Negative for shortness of breath.   Cardiovascular: Negative for chest pain.  Gastrointestinal: Negative for abdominal pain, diarrhea, nausea and vomiting.  Skin: Positive for rash and wound.  Neurological: Negative for light-headedness and headaches.  All other systems reviewed and are negative.    Physical Exam Updated Vital Signs BP (!) 175/116 (BP Location: Right Arm)   Pulse 99   Temp 99 F (37.2 C) (Oral)   Resp 18   SpO2 100%   Physical Exam  Constitutional: He appears well-developed and well-nourished.  HENT:  Head: Normocephalic and atraumatic.  Mouth/Throat: Mucous membranes are not dry.  Eyes: Conjunctivae are normal.  Neck: Neck supple.  Cardiovascular: Normal rate and regular rhythm.   No murmur heard. Pulmonary/Chest: Effort normal and breath sounds normal. No respiratory distress.  Abdominal: Soft. There is no tenderness.  Musculoskeletal: He exhibits no edema.  Left foot: Great toe with wound to the plantar aspect. Surrounding erythema, swelling, warmth, extending up foot. Mild black discoloration on the plantar aspect. No crepitus appreciated. Full ROM of all joints.   Neurological: He is alert.  Skin: Skin is warm and dry.  Psychiatric: He has a normal mood and affect.  Nursing note and vitals reviewed.    ED Treatments / Results  Labs (all labs ordered are listed,  but only abnormal results are displayed) Labs Reviewed  COMPREHENSIVE METABOLIC PANEL - Abnormal; Notable for the following:       Result Value   Glucose, Bld 208 (*)    Calcium 8.7 (*)    Albumin 3.2 (*)    AST 14 (*)    ALT 13 (*)    Total Bilirubin 0.2 (*)    All other components within normal limits  CBC WITH DIFFERENTIAL/PLATELET - Abnormal; Notable for the following:    WBC 15.1 (*)    RBC 4.20 (*)    Hemoglobin 11.6 (*)    HCT 36.5 (*)    Neutro Abs 10.5 (*)    All other components within normal limits  URINALYSIS, ROUTINE W REFLEX MICROSCOPIC - Abnormal; Notable for the following:    Glucose, UA 50 (*)    Protein, ur >=300 (*)    Bacteria, UA RARE (*)  All other components within normal limits  C-REACTIVE PROTEIN - Abnormal; Notable for the following:    CRP 2.2 (*)    All other components within normal limits  SEDIMENTATION RATE - Abnormal; Notable for the following:    Sed Rate 54 (*)    All other components within normal limits  GLUCOSE, CAPILLARY - Abnormal; Notable for the following:    Glucose-Capillary 141 (*)    All other components within normal limits  CBG MONITORING, ED - Abnormal; Notable for the following:    Glucose-Capillary 190 (*)    All other components within normal limits  CULTURE, BLOOD (ROUTINE X 2)  CULTURE, BLOOD (ROUTINE X 2)  HEMOGLOBIN A1C  HIV ANTIBODY (ROUTINE TESTING)  BASIC METABOLIC PANEL  CBC  I-STAT CG4 LACTIC ACID, ED  I-STAT CG4 LACTIC ACID, ED    EKG  EKG Interpretation None       Radiology Dg Toe Great Left  Result Date: 08/20/2016 CLINICAL DATA:  Diabetic with left great toe infection. Presents with black, swollen toe. Necrosis. EXAM: LEFT GREAT TOE COMPARISON:  None. FINDINGS: There is soft tissue swelling throughout the great toe. There is soft tissue emphysema distally, undermining the nail bed. No evidence of foreign body. Allowing for the overlying soft tissue emphysema, no definite bone destruction to  suggest osteomyelitis. No evidence of acute fracture or dislocation. There are prominent vascular calcifications, typical of diabetes. IMPRESSION: Soft tissue swelling and emphysema in the great toe. No radiographic evidence of osteomyelitis. Electronically Signed   By: Richardean Sale M.D.   On: 08/20/2016 17:12    Procedures Procedures (including critical care time)  Medications Ordered in ED Medications  amLODipine (NORVASC) tablet 5 mg (not administered)  gabapentin (NEURONTIN) capsule 300 mg (not administered)  Insulin Detemir (LEVEMIR) FlexPen 15 Units (not administered)  lisinopril (PRINIVIL,ZESTRIL) tablet 20 mg (not administered)  insulin aspart (novoLOG) injection 0-15 Units (not administered)  insulin aspart (novoLOG) injection 0-5 Units (0 Units Subcutaneous Not Given 08/20/16 2136)  heparin injection 5,000 Units (not administered)  nicotine (NICODERM CQ - dosed in mg/24 hours) patch 21 mg (not administered)  cefTRIAXone (ROCEPHIN) 2 g in dextrose 5 % 50 mL IVPB (not administered)    And  metroNIDAZOLE (FLAGYL) tablet 500 mg (not administered)  levofloxacin (LEVAQUIN) IVPB 750 mg (750 mg Intravenous New Bag/Given 08/20/16 2021)  sodium chloride 0.9 % bolus 1,000 mL (1,000 mLs Intravenous New Bag/Given 08/20/16 2020)     Initial Impression / Assessment and Plan / ED Course  I have reviewed the triage vital signs and the nursing notes.  Pertinent labs & imaging results that were available during my care of the patient were reviewed by me and considered in my medical decision making (see chart for details).     Patient with evidence of diabetic foot wound with surrounding cellulitis. X-ray with evidence of soft tissue inflammation and free air, likely related to the patient's open wounds. No crepitus appreciated on exam. Patient is not toxic appearing here. Vital signs show mild tachycardia, otherwise afebrile and normal and stable vital signs. Leukocytosis to 15 here. Meets  sepsis criteria. Started on antibiotics. We'll admit him to the hospital. Orthopedics notified of the patient's case.  Final Clinical Impressions(s) / ED Diagnoses   Final diagnoses:  Diabetic foot ulcer Cellulitis    New Prescriptions Current Discharge Medication List       Maryan Puls, MD 08/20/16 2259    Drenda Freeze, MD 08/21/16 [phone]

## 2016-08-21 ENCOUNTER — Inpatient Hospital Stay (HOSPITAL_COMMUNITY): Payer: Self-pay

## 2016-08-21 DIAGNOSIS — L732 Hidradenitis suppurativa: Secondary | ICD-10-CM

## 2016-08-21 DIAGNOSIS — Z823 Family history of stroke: Secondary | ICD-10-CM

## 2016-08-21 DIAGNOSIS — Z8249 Family history of ischemic heart disease and other diseases of the circulatory system: Secondary | ICD-10-CM

## 2016-08-21 DIAGNOSIS — E785 Hyperlipidemia, unspecified: Secondary | ICD-10-CM

## 2016-08-21 DIAGNOSIS — I10 Essential (primary) hypertension: Secondary | ICD-10-CM

## 2016-08-21 DIAGNOSIS — E13621 Other specified diabetes mellitus with foot ulcer: Secondary | ICD-10-CM

## 2016-08-21 DIAGNOSIS — K219 Gastro-esophageal reflux disease without esophagitis: Secondary | ICD-10-CM

## 2016-08-21 DIAGNOSIS — B351 Tinea unguium: Secondary | ICD-10-CM

## 2016-08-21 DIAGNOSIS — M86472 Chronic osteomyelitis with draining sinus, left ankle and foot: Secondary | ICD-10-CM

## 2016-08-21 DIAGNOSIS — M86172 Other acute osteomyelitis, left ankle and foot: Secondary | ICD-10-CM

## 2016-08-21 DIAGNOSIS — Z809 Family history of malignant neoplasm, unspecified: Secondary | ICD-10-CM

## 2016-08-21 DIAGNOSIS — L97509 Non-pressure chronic ulcer of other part of unspecified foot with unspecified severity: Secondary | ICD-10-CM

## 2016-08-21 DIAGNOSIS — F17210 Nicotine dependence, cigarettes, uncomplicated: Secondary | ICD-10-CM

## 2016-08-21 DIAGNOSIS — Z91120 Patient's intentional underdosing of medication regimen due to financial hardship: Secondary | ICD-10-CM

## 2016-08-21 DIAGNOSIS — E1140 Type 2 diabetes mellitus with diabetic neuropathy, unspecified: Secondary | ICD-10-CM

## 2016-08-21 DIAGNOSIS — Z833 Family history of diabetes mellitus: Secondary | ICD-10-CM

## 2016-08-21 LAB — MRSA PCR SCREENING: MRSA BY PCR: NEGATIVE

## 2016-08-21 LAB — BASIC METABOLIC PANEL
Anion gap: 6 (ref 5–15)
BUN: 15 mg/dL (ref 6–20)
CHLORIDE: 105 mmol/L (ref 101–111)
CO2: 25 mmol/L (ref 22–32)
CREATININE: 1.16 mg/dL (ref 0.61–1.24)
Calcium: 8.2 mg/dL — ABNORMAL LOW (ref 8.9–10.3)
GFR calc Af Amer: >60 mL/min (ref >60)
GFR calc non Af Amer: >60 mL/min (ref >60)
GLUCOSE: 169 mg/dL — AB (ref 65–99)
Potassium: 3.4 mmol/L — ABNORMAL LOW (ref 3.5–5.1)
Sodium: 136 mmol/L (ref 135–145)

## 2016-08-21 LAB — CBC
HCT: 32.5 % — ABNORMAL LOW (ref 39.0–52.0)
Hemoglobin: 10.2 g/dL — ABNORMAL LOW (ref 13.0–17.0)
MCH: 27.3 pg (ref 26.0–34.0)
MCHC: 31.4 g/dL (ref 30.0–36.0)
MCV: 87.1 fL (ref 78.0–100.0)
PLATELETS: 290 10*3/uL (ref 150–400)
RBC: 3.73 MIL/uL — ABNORMAL LOW (ref 4.22–5.81)
RDW: 14.4 % (ref 11.5–15.5)
WBC: 13.9 10*3/uL — ABNORMAL HIGH (ref 4.0–10.5)

## 2016-08-21 LAB — GLUCOSE, CAPILLARY
Glucose-Capillary: 134 mg/dL — ABNORMAL HIGH (ref 65–99)
Glucose-Capillary: 142 mg/dL — ABNORMAL HIGH (ref 65–99)
Glucose-Capillary: 238 mg/dL — ABNORMAL HIGH (ref 65–99)
Glucose-Capillary: 102 mg/dL — ABNORMAL HIGH (ref 65–99)

## 2016-08-21 LAB — HIV ANTIBODY (ROUTINE TESTING W REFLEX): HIV Screen 4th Generation wRfx: NONREACTIVE

## 2016-08-21 MED ORDER — CHLORHEXIDINE GLUCONATE 4 % EX LIQD
60.0000 mL | Freq: Once | CUTANEOUS | Status: AC
  Administered 2016-08-22: 4 via TOPICAL

## 2016-08-21 MED ORDER — GLUCERNA SHAKE PO LIQD
237.0000 mL | Freq: Two times a day (BID) | ORAL | Status: DC
  Administered 2016-08-21 – 2016-08-23 (×2): 237 mL via ORAL

## 2016-08-21 MED ORDER — POTASSIUM CHLORIDE CRYS ER 20 MEQ PO TBCR
40.0000 meq | EXTENDED_RELEASE_TABLET | Freq: Once | ORAL | Status: AC
  Administered 2016-08-21: 40 meq via ORAL
  Filled 2016-08-21: qty 2

## 2016-08-21 NOTE — Progress Notes (Signed)
   Subjective: No acute events since admission. No fevers, chills, nausea, or other systemic symptoms. He spoke with Dr. due to this morning and was informed of the plan to amputate, and is anxious about the prospect of losing his toe.  Objective:  Vital signs in last 24 hours: Vitals:   08/20/16 2040 08/20/16 2138 08/20/16 2300 08/21/16 0400  BP: (!) 165/89 (!) 175/116  (!) 153/84  Pulse: 85 99  79  Resp: 18 18  18   Temp: 98.9 F (37.2 C) 99 F (37.2 C)  99.3 F (37.4 C)  TempSrc:  Oral  Oral  SpO2: 100% 100%  100%  Weight:   182 lb 1.6 oz (82.6 kg)   Height:   5\' 5"  (1.651 m)    Physical Exam  Constitutional: He is oriented to person, place, and time. He appears well-developed and well-nourished. No distress.  Cardiovascular: Normal rate and regular rhythm.   DP pulses 3+ bilaterally  Pulmonary/Chest: Effort normal and breath sounds normal.  Musculoskeletal:  Left great toe with ulceration, dusky discoloration with surrounding pallor on plantar surface Onychomycosis of all toes  Neurological: He is alert and oriented to person, place, and time.  Psychiatric: He has a normal mood and affect. His behavior is normal.   CBC Latest Ref Rng & Units 08/21/2016 08/20/2016 07/12/2014  WBC 4.0 - 10.5 K/uL 13.9(H) 15.1(H) 9.3  Hemoglobin 13.0 - 17.0 g/dL 10.2(L) 11.6(L) 15.8  Hematocrit 39.0 - 52.0 % 32.5(L) 36.5(L) 47.3  Platelets 150 - 400 K/uL 290 300 242   CMP Latest Ref Rng & Units 08/21/2016 08/20/2016 07/12/2014  Glucose 65 - 99 mg/dL 169(H) 208(H) 446(H)  BUN 6 - 20 mg/dL 15 15 12   Creatinine 0.61 - 1.24 mg/dL 1.16 1.17 1.05  Sodium 135 - 145 mmol/L 136 137 133(L)  Potassium 3.5 - 5.1 mmol/L 3.4(L) 3.9 4.6  Chloride 101 - 111 mmol/L 105 104 97  CO2 22 - 32 mmol/L 25 27 27   Calcium 8.9 - 10.3 mg/dL 8.2(L) 8.7(L) 9.6  Total Protein 6.5 - 8.1 g/dL - 6.9 -  Total Bilirubin 0.3 - 1.2 mg/dL - 0.2(L) -  Alkaline Phos 38 - 126 U/L - 95 -  AST 15 - 41 U/L - 14(L) -  ALT 17 - 63 U/L  - 13(L) -     Assessment/Plan:  Active Problems:   Foot ulcer due to secondary DM ([name])   Chronic osteomyelitis of left foot with draining sinus ([name])   46 y.o. male with diabetes and osteomyelitis of left great toe secondary to a diabetic foot ulcer. He is afebrile and hemodynamically stable with leukocytosis improving on antibiotics. Plan for amputation tomorrow.  #Osteomyelitis of L Great Toe #Diabetic Toe Ulcer Leukocytosis -continue ceftriaxone and metronidazole -appreciate management by Dr Sharol Given, plan for amputation tomorrow 5/27 -NPO at midnight  #DM On Humalog 10U once daily with dinner and levemir 30U QHS. -SSI  #HTN -continue home lisinopril 10 mg daily and amlodipine 5 mg daily  #Onychomycosis -continue terbinafine 250 mg PO daily  Fluids: none Diet: Carb; NPO at midnight DVT Prophylaxis: lovenox Code Status: full  Dispo: Anticipated discharge in approximately 1-2 day(s).   Minus Liberty, MD 08/21/2016, 6:35 AM Pager: [phone]

## 2016-08-21 NOTE — Consult Note (Signed)
[facility] Nurse wound consult note Reason for Consult: Simultaneously consulted with Orthopedics.  Patient was seen by Dr. Sharol Given this morning and will have surgery on this toe tomorrow. Wound type: Infectious, neuropathic WOC nursing team will not follow, but will remain available to this patient, the nursing and medical teams.  Please re-consult if needed. Thanks, Maudie Flakes, MSN, RN, [place], Arther Abbott  Pager# [phone]

## 2016-08-21 NOTE — Consult Note (Signed)
ORTHOPAEDIC CONSULTATION  REQUESTING PHYSICIAN: Bartholomew Crews, MD  Chief Complaint: Pain and swelling and drainage left great toe for several months.  HPI: [name] is a 46 y.o. male who presents with chronic ulceration left great toe with drainage pain and swelling. Patient has diabetic insensate neuropathy most recently moved to [facility] from [facility].  Past Medical History:  Diagnosis Date  . Diabetes mellitus type II, uncontrolled ([place])   . Hyperlipidemia   . Hypertension   . Other and unspecified angina pectoris   . Tobacco use    Past Surgical History:  Procedure Laterality Date  . LEFT HEART CATHETERIZATION WITH CORONARY ANGIOGRAM N/A 07/31/2013   Procedure: LEFT HEART CATHETERIZATION WITH CORONARY ANGIOGRAM;  Surgeon: Jettie Booze, MD;  Location: [facility] CATH LAB;  Service: Cardiovascular;  Laterality: N/A;  . spinal tap     Social History   Social History  . Marital status: Single    Spouse name: N/A  . Number of children: N/A  . Years of education: N/A   Occupational History  . 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 Smoking status: Current Every Day Smoker    Packs/day: 1.00    Years: 20.00    Types: Cigarettes  . Smokeless tobacco: Never Used  . Alcohol use Yes     Comment: occ  . Drug use: No  . Sexual activity: Not Currently   Other Topics Concern  . None   Social History Narrative   Works in [employer], recently moved in early 2018 from [place]. Previous saw free clinic providers.    Family History  Problem Relation Age of Onset  . Cancer Mother   . Diabetes Father   . Hypertension Father    - negative except otherwise stated in the family history section Allergies  Allergen Reactions  . Bee Venom Swelling  . Penicillins Hives   Prior to Admission medications   Medication Sig Start Date End Date Taking? Authorizing Provider  amLODipine (NORVASC) 5 MG tablet  Take 1 tablet (5 mg total) by mouth daily. 07/12/14   Leonard Schwartz, MD  atorvastatin (LIPITOR) 20 MG tablet Take 1 tablet (20 mg total) by mouth daily. 12/02/13   Malvin Johns, MD  Cyanocobalamin (VITAMIN B-12 PO) Take 1 capsule by mouth daily.    [provider]  doxycycline (VIBRAMYCIN) 100 MG capsule Take 1 capsule (100 mg total) by mouth 2 (two) times daily. One po bid x 7 days Patient not taking: Reported on 03/06/2014 12/02/13   Malvin Johns, MD  gabapentin (NEURONTIN) 300 MG capsule Take 1 capsule (300 mg total) by mouth 3 (three) times daily. 07/12/14   Leonard Schwartz, MD  Insulin Detemir (LEVEMIR FLEXPEN) 100 UNIT/ML Pen Inject 30 Units into the skin at bedtime. 07/12/14   Leonard Schwartz, MD  insulin lispro (HUMALOG KWIKPEN) 100 UNIT/ML KiwkPen Use 10 units 10-15 minutes before your evening meal 07/12/14   Leonard Schwartz, MD  lisinopril (PRINIVIL,ZESTRIL) 20 MG tablet Take 1 tablet (20 mg total) by mouth daily. 07/12/14   Leonard Schwartz, MD  metFORMIN (GLUCOPHAGE) 500 MG tablet Take 1 tablet (500 mg total) by mouth 2 (two) times daily with a meal. 07/12/14   Leonard Schwartz, MD  mupirocin cream (BACTROBAN) 2 % Apply 1 application topically 2 (two) times daily. Patient not taking: Reported on 03/06/2014 12/02/13   Malvin Johns, MD  nitroGLYCERIN (NITROSTAT) 0.4 MG SL tablet  Place 1 tablet (0.4 mg total) under the tongue every 5 (five) minutes as needed for chest pain. Patient not taking: Reported on 03/06/2014 07/26/13   Jettie Booze, MD  omeprazole (PRILOSEC) 20 MG capsule Take 1 capsule (20 mg total) by mouth daily. 12/02/13   Malvin Johns, MD  omeprazole (PRILOSEC) 20 MG capsule Take 1 capsule (20 mg total) by mouth daily. 03/06/14   Varney Biles, MD  traMADol (ULTRAM) 50 MG tablet Take 1 tablet (50 mg total) by mouth every 6 (six) hours as needed. 03/06/14   Varney Biles, MD   Dg Toe Great Left  Result Date: 08/20/2016 CLINICAL DATA:  Diabetic with left great toe  infection. Presents with black, swollen toe. Necrosis. EXAM: LEFT GREAT TOE COMPARISON:  None. FINDINGS: There is soft tissue swelling throughout the great toe. There is soft tissue emphysema distally, undermining the nail bed. No evidence of foreign body. Allowing for the overlying soft tissue emphysema, no definite bone destruction to suggest osteomyelitis. No evidence of acute fracture or dislocation. There are prominent vascular calcifications, typical of diabetes. IMPRESSION: Soft tissue swelling and emphysema in the great toe. No radiographic evidence of osteomyelitis. Electronically Signed   By: Richardean Sale M.D.   On: 08/20/2016 17:12   - pertinent xrays, CT, MRI studies were reviewed and independently interpreted  Positive ROS: All other systems have been reviewed and were otherwise negative with the exception of those mentioned in the HPI and as above.  Physical Exam: General: Alert, no acute distress Psychiatric: Patient is competent for consent with normal mood and affect Lymphatic: No axillary or cervical lymphadenopathy Cardiovascular: No pedal edema Respiratory: No cyanosis, no use of accessory musculature GI: No organomegaly, abdomen is soft and non-tender  Skin: Examination patient has macerated skin around the left great toe. He has ulceration that extends down to bone.   Neurologic: Patient does not have protective sensation bilateral lower extremities.   MUSCULOSKELETAL:  Examination of the left foot patient has a good dorsalis pedis pulse he has swelling ulceration and maceration of the left great toe. Radiographs shows a lytic changes consistent with osteomyelitis.  Assessment: Assessment: Osteomyelitis ulceration left great toe with diabetic insensate neuropathy.  Plan: Plan: We'll plan for an amputation of left great toe tomorrow Sunday at the MTP joint. Patient can be discharged postoperatively once he is safe with ambulation with touchdown weightbearing on the  left.  Thank you for the consult and the opportunity to see [name], [name] [phone] 8:13 AM

## 2016-08-21 NOTE — Progress Notes (Signed)
Initial Nutrition Assessment  DOCUMENTATION CODES:  Obesity unspecified  INTERVENTION:  Comprehensive DM education given   Glucerna Shake po BID, each supplement provides 220 kcal and 10 grams of protein  NUTRITION DIAGNOSIS:  Food and nutrition related knowledge deficit related to Poor prior education as evidenced by Report of extremely poor eating patterns for diabetic and necrotic DM ulcer.  GOAL:  Patient will meet greater than or equal to 90% of their needs  MONITOR:  PO intake, Supplement acceptance, Diet advancement, Labs, Skin  REASON FOR ASSESSMENT:  Malnutrition Screening Tool    ASSESSMENT:  46 y/o male  Pmhx HTN, DM, HLD, GERD. Presented with painful necrotic Left toe that has worsened over last 2 months. Has poorly controlled BG. Anticipated Amputation of toe Sunday.   Pt reports disordered eating pattern and dietary habits. He does not eat breakfast. He "snack throughout the day" at work rather than having "meal". He then goes home and "may have  bag of chips" before going to bed.   He says this episode was a wake-up call and he desires to improve his DM control. RD visit consisted of Comprehensive Diabetic education.   Handouts provided included: "Label reading for diabetics", "Type 2 diabetes Nutrition Therapy" and a copy of the "My plate".   Pt says "I have not eaten vegetables in over 40 years" and he has been reluctant to incorporate them into his diet because "I dont know how they will affect me".   RD explained the need for a balanced diet. Taught how fiber and protein slow sugar absorption and can improve BG control. Went over the My plate recommendations and what the ideal food group ratios at each meal are food for best glycemic control.   He drinks sugary Beverages. Explained how these are the quickest ways to increase ones BG and are a terrible choice for diabetics. Recommended he either switch to SF beverages or drink his sugary beverage at meals while  incorporating the number of carbs into his total amount for the meal ie Con Carb diet.   Showed nutritional label and what pertinent information to look for- Portion Size and carb amount. Gave starting goal of 60-80 g carb per meal and 15-30 per snack.   Discussed carb counting and showed the appropriate serving sizes of most common carbs foods.   Went over a [employer] and illustrated numerous meal examples that followed the DM diet.   Pt reports understanding. RD encouraged him to f/u with either PCP or endocrinologist where further resources, such as 1 on 1 DM counseling, should be availabele  Physical Assessment: deferred-pt in street clothes   Labs BGs:135-210, WBC: 13.9, CRP: 2.2, k: 3.4,  Medications: IV abx, insulin,    Recent Labs Lab 08/20/16 1638 08/21/16 0345  NA 137 136  K 3.9 3.4*  CL 104 105  CO2 27 25  BUN 15 15  CREATININE 1.17 1.16  CALCIUM 8.7* 8.2*  GLUCOSE 208* 169*   Diet Order:  Diet Carb Modified Fluid consistency: Thin; Room service appropriate? Yes Diet NPO time specified  Skin: DM ulcer left great toe  Last BM:  5/25  Height:  Ht Readings from Last 1 Encounters:  08/20/16 5\' 5"  (1.651 m)   Weight:  Wt Readings from Last 1 Encounters:  08/20/16 182 lb 1.6 oz (82.6 kg)   Wt Readings from Last 10 Encounters:  08/20/16 182 lb 1.6 oz (82.6 kg)  07/12/14 174 lb (78.9 kg)  08/27/13 179 lb (81.2 kg)  08/17/13 176 lb (79.8 kg)  07/31/13 176 lb (79.8 kg)  07/26/13 176 lb (79.8 kg)  07/20/13 178 lb (80.7 kg)  02/02/13 184 lb (83.5 kg)  12/01/12 180 lb (81.6 kg)  11/28/12 179 lb (81.2 kg)   Ideal Body Weight:  59.1 kg  BMI:  Body mass index is 30.3 kg/m.  Estimated Nutritional Needs:  Kcal:  1750-2000 (21-24 kcal/kg bw) Protein:  77-89 g Pro (1.3-1.5 g/kg ibw) Fluid:  1.8-2 Liters (1 ml/kcal)  EDUCATION NEEDS:  Education needs addressed  Burtis Junes RD, LDN, CNSC Clinical Nutrition Pager: [phone] 08/21/2016 2:34 PM

## 2016-08-22 ENCOUNTER — Inpatient Hospital Stay (HOSPITAL_COMMUNITY): Payer: Self-pay | Admitting: Anesthesiology

## 2016-08-22 ENCOUNTER — Encounter (HOSPITAL_COMMUNITY)
Admission: EM | Disposition: A | Discharge status: Home / Self Care | Payer: Self-pay | Source: Home / Self Care | Attending: Internal Medicine

## 2016-08-22 HISTORY — PX: AMPUTATION: SHX166

## 2016-08-22 LAB — CBC
HCT: 35.7 % — ABNORMAL LOW (ref 39.0–52.0)
HEMOGLOBIN: 11.1 g/dL — AB (ref 13.0–17.0)
MCH: 27.1 pg (ref 26.0–34.0)
MCHC: 31.1 g/dL (ref 30.0–36.0)
MCV: 87.1 fL (ref 78.0–100.0)
Platelets: 303 10*3/uL (ref 150–400)
RBC: 4.1 MIL/uL — ABNORMAL LOW (ref 4.22–5.81)
RDW: 14.5 % (ref 11.5–15.5)
WBC: 14.3 10*3/uL — ABNORMAL HIGH (ref 4.0–10.5)

## 2016-08-22 LAB — HEMOGLOBIN A1C
HEMOGLOBIN A1C: 9.8 % — AB (ref 4.8–5.6)
MEAN PLASMA GLUCOSE: 235 mg/dL

## 2016-08-22 LAB — BASIC METABOLIC PANEL
Anion gap: 5 (ref 5–15)
BUN: 20 mg/dL (ref 6–20)
CO2: 28 mmol/L (ref 22–32)
CREATININE: 1.13 mg/dL (ref 0.61–1.24)
Calcium: 8.7 mg/dL — ABNORMAL LOW (ref 8.9–10.3)
Chloride: 106 mmol/L (ref 101–111)
GFR calc Af Amer: >60 mL/min (ref >60)
GFR calc non Af Amer: >60 mL/min (ref >60)
Glucose, Bld: 243 mg/dL — ABNORMAL HIGH (ref 65–99)
Potassium: 4 mmol/L (ref 3.5–5.1)
SODIUM: 139 mmol/L (ref 135–145)

## 2016-08-22 LAB — GLUCOSE, CAPILLARY
GLUCOSE-CAPILLARY: 176 mg/dL — AB (ref 65–99)
Glucose-Capillary: 207 mg/dL — ABNORMAL HIGH (ref 65–99)
Glucose-Capillary: 87 mg/dL (ref 65–99)
Glucose-Capillary: 95 mg/dL (ref 65–99)
Glucose-Capillary: 90 mg/dL (ref 65–99)
Glucose-Capillary: 181 mg/dL — ABNORMAL HIGH (ref 65–99)

## 2016-08-22 SURGERY — AMPUTATION DIGIT
Anesthesia: Regional | Site: Toe | Laterality: Left

## 2016-08-22 MED ORDER — POLYETHYLENE GLYCOL 3350 17 G PO PACK: 17.0000 g | PACK | Freq: Every day | ORAL | Status: DC | PRN

## 2016-08-22 MED ORDER — METHOCARBAMOL 500 MG PO TABS
500.0000 mg | ORAL_TABLET | Freq: Four times a day (QID) | ORAL | Status: DC | PRN
  Administered 2016-08-23: 500 mg via ORAL
  Filled 2016-08-22 (×2): qty 1

## 2016-08-22 MED ORDER — PROPOFOL 500 MG/50ML IV EMUL
INTRAVENOUS | Status: DC | PRN
  Administered 2016-08-22: 25 ug/kg/min via INTRAVENOUS

## 2016-08-22 MED ORDER — ROPIVACAINE HCL 5 MG/ML IJ SOLN
INTRAMUSCULAR | Status: DC | PRN
  Administered 2016-08-22: 40 mL via PERINEURAL

## 2016-08-22 MED ORDER — MIDAZOLAM HCL 2 MG/2ML IJ SOLN
1.0000 mg | Freq: Once | INTRAMUSCULAR | Status: AC
  Administered 2016-08-22: 1 mg via INTRAVENOUS

## 2016-08-22 MED ORDER — POVIDONE-IODINE 10 % EX SWAB: 2.0000 "application " | Freq: Once | CUTANEOUS | Status: DC

## 2016-08-22 MED ORDER — METOCLOPRAMIDE HCL 5 MG/ML IJ SOLN: 5.0000 mg | Freq: Three times a day (TID) | INTRAMUSCULAR | Status: DC | PRN

## 2016-08-22 MED ORDER — PROMETHAZINE HCL 25 MG/ML IJ SOLN: 6.2500 mg | INTRAMUSCULAR | Status: DC | PRN

## 2016-08-22 MED ORDER — ONDANSETRON HCL 4 MG PO TABS
4.0000 mg | ORAL_TABLET | Freq: Four times a day (QID) | ORAL | Status: DC | PRN
  Administered 2016-08-24: 4 mg via ORAL
  Filled 2016-08-22: qty 1

## 2016-08-22 MED ORDER — METOCLOPRAMIDE HCL 5 MG PO TABS
5.0000 mg | ORAL_TABLET | Freq: Three times a day (TID) | ORAL | Status: DC | PRN
  Administered 2016-08-24: 10 mg via ORAL
  Filled 2016-08-22: qty 2

## 2016-08-22 MED ORDER — MAGNESIUM CITRATE PO SOLN: 1.0000 | Freq: Once | ORAL | Status: DC | PRN

## 2016-08-22 MED ORDER — ONDANSETRON HCL 4 MG/2ML IJ SOLN
4.0000 mg | Freq: Four times a day (QID) | INTRAMUSCULAR | Status: DC | PRN
  Administered 2016-08-23 – 2016-08-24 (×2): 4 mg via INTRAVENOUS
  Filled 2016-08-22 (×2): qty 2

## 2016-08-22 MED ORDER — METHOCARBAMOL 1000 MG/10ML IJ SOLN
500.0000 mg | Freq: Four times a day (QID) | INTRAVENOUS | Status: DC | PRN
  Filled 2016-08-22: qty 5

## 2016-08-22 MED ORDER — ACETAMINOPHEN 650 MG RE SUPP: 650.0000 mg | Freq: Four times a day (QID) | RECTAL | Status: DC | PRN

## 2016-08-22 MED ORDER — FENTANYL CITRATE (PF) 100 MCG/2ML IJ SOLN
INTRAMUSCULAR | Status: AC
  Administered 2016-08-22: 100 ug via INTRAVENOUS
  Filled 2016-08-22: qty 2

## 2016-08-22 MED ORDER — MIDAZOLAM HCL 2 MG/2ML IJ SOLN: 0.5000 mg | Freq: Once | INTRAMUSCULAR | Status: DC | PRN

## 2016-08-22 MED ORDER — LACTATED RINGERS IV SOLN
INTRAVENOUS | Status: DC | PRN
  Administered 2016-08-22: 12:00:00 via INTRAVENOUS

## 2016-08-22 MED ORDER — MIDAZOLAM HCL 2 MG/2ML IJ SOLN
INTRAMUSCULAR | Status: AC
Start: 2016-08-22 — End: 2016-08-22
  Filled 2016-08-22: qty 2

## 2016-08-22 MED ORDER — HYDROMORPHONE HCL 1 MG/ML IJ SOLN: 1.0000 mg | INTRAMUSCULAR | Status: DC | PRN

## 2016-08-22 MED ORDER — ACETAMINOPHEN 325 MG PO TABS
650.0000 mg | ORAL_TABLET | Freq: Four times a day (QID) | ORAL | Status: DC | PRN
  Administered 2016-08-24: 650 mg via ORAL
  Filled 2016-08-22: qty 2

## 2016-08-22 MED ORDER — BISACODYL 10 MG RE SUPP: 10.0000 mg | Freq: Every day | RECTAL | Status: DC | PRN

## 2016-08-22 MED ORDER — MIDAZOLAM HCL 2 MG/2ML IJ SOLN
INTRAMUSCULAR | Status: AC
  Administered 2016-08-22: 1 mg via INTRAVENOUS
  Filled 2016-08-22: qty 2

## 2016-08-22 MED ORDER — FENTANYL CITRATE (PF) 100 MCG/2ML IJ SOLN
100.0000 ug | Freq: Once | INTRAMUSCULAR | Status: AC
  Administered 2016-08-22: 100 ug via INTRAVENOUS

## 2016-08-22 MED ORDER — FENTANYL CITRATE (PF) 250 MCG/5ML IJ SOLN
INTRAMUSCULAR | Status: AC
  Filled 2016-08-22: qty 5

## 2016-08-22 MED ORDER — PROPOFOL 10 MG/ML IV BOLUS
INTRAVENOUS | Status: AC
Start: 2016-08-22 — End: 2016-08-22
  Filled 2016-08-22: qty 20

## 2016-08-22 MED ORDER — HYDROMORPHONE HCL 1 MG/ML IJ SOLN
0.2500 mg | INTRAMUSCULAR | Status: DC | PRN
Start: 2016-08-22 — End: 2016-08-22

## 2016-08-22 MED ORDER — MEPERIDINE HCL 25 MG/ML IJ SOLN: 6.2500 mg | INTRAMUSCULAR | Status: DC | PRN

## 2016-08-22 MED ORDER — LACTATED RINGERS IV SOLN
Freq: Once | INTRAVENOUS | Status: AC
  Administered 2016-08-22: 11:00:00 via INTRAVENOUS

## 2016-08-22 MED ORDER — SODIUM CHLORIDE 0.9 % IV SOLN: INTRAVENOUS | Status: DC

## 2016-08-22 MED ORDER — OXYCODONE HCL 5 MG PO TABS
5.0000 mg | ORAL_TABLET | ORAL | Status: DC | PRN
Start: 2016-08-22 — End: 2016-08-25
  Administered 2016-08-22 (×2): 5 mg via ORAL
  Administered 2016-08-23: 10 mg via ORAL
  Administered 2016-08-23: 5 mg via ORAL
  Administered 2016-08-23 – 2016-08-24 (×2): 10 mg via ORAL
  Filled 2016-08-22: qty 1
  Filled 2016-08-22: qty 2
  Filled 2016-08-22: qty 1
  Filled 2016-08-22 (×4): qty 2
  Filled 2016-08-22: qty 1

## 2016-08-22 MED ORDER — 0.9 % SODIUM CHLORIDE (POUR BTL) OPTIME
TOPICAL | Status: DC | PRN
  Administered 2016-08-22: 1000 mL

## 2016-08-22 MED ORDER — DOCUSATE SODIUM 100 MG PO CAPS
100.0000 mg | ORAL_CAPSULE | Freq: Two times a day (BID) | ORAL | Status: DC
  Administered 2016-08-22 – 2016-08-23 (×3): 100 mg via ORAL
  Filled 2016-08-22 (×4): qty 1

## 2016-08-22 SURGICAL SUPPLY — 28 items
BLADE SURG 21 STRL SS (BLADE) ×2 IMPLANT
BNDG CMPR 9X4 STRL LF SNTH (GAUZE/BANDAGES/DRESSINGS)
BNDG COHESIVE 4X5 TAN STRL (GAUZE/BANDAGES/DRESSINGS) ×2 IMPLANT
BNDG ESMARK 4X9 LF (GAUZE/BANDAGES/DRESSINGS) IMPLANT
BNDG GAUZE ELAST 4 BULKY (GAUZE/BANDAGES/DRESSINGS) ×2 IMPLANT
COVER SURGICAL LIGHT HANDLE (MISCELLANEOUS) ×3 IMPLANT
DRAPE U-SHAPE 47X51 STRL (DRAPES) ×2 IMPLANT
DRSG ADAPTIC 3X8 NADH LF (GAUZE/BANDAGES/DRESSINGS) ×1 IMPLANT
DRSG PAD ABDOMINAL 8X10 ST (GAUZE/BANDAGES/DRESSINGS) ×1 IMPLANT
DURAPREP 26ML APPLICATOR (WOUND CARE) ×2 IMPLANT
ELECT REM PT RETURN 9FT ADLT (ELECTROSURGICAL) ×2
ELECTRODE REM PT RTRN 9FT ADLT (ELECTROSURGICAL) ×1 IMPLANT
GAUZE SPONGE 4X4 12PLY STRL (GAUZE/BANDAGES/DRESSINGS) ×1 IMPLANT
GLOVE BIOGEL PI IND STRL 9 (GLOVE) ×1 IMPLANT
GLOVE BIOGEL PI INDICATOR 9 (GLOVE) ×1
GLOVE SURG ORTHO 9.0 STRL STRW (GLOVE) ×2 IMPLANT
GOWN STRL REUS W/ TWL XL LVL3 (GOWN DISPOSABLE) ×2 IMPLANT
GOWN STRL REUS W/TWL XL LVL3 (GOWN DISPOSABLE) ×4
KIT BASIN OR (CUSTOM PROCEDURE TRAY) ×2 IMPLANT
KIT ROOM TURNOVER OR (KITS) ×2 IMPLANT
MANIFOLD NEPTUNE II (INSTRUMENTS) ×1 IMPLANT
NEEDLE 22X1 1/2 (OR ONLY) (NEEDLE) IMPLANT
NS IRRIG 1000ML POUR BTL (IV SOLUTION) ×2 IMPLANT
PACK ORTHO EXTREMITY (CUSTOM PROCEDURE TRAY) ×2 IMPLANT
PAD ARMBOARD 7.5X6 YLW CONV (MISCELLANEOUS) ×4 IMPLANT
SUT ETHILON 2 0 PSLX (SUTURE) ×2 IMPLANT
SYR CONTROL 10ML LL (SYRINGE) IMPLANT
TOWEL OR 17X26 10 PK STRL BLUE (TOWEL DISPOSABLE) ×2 IMPLANT

## 2016-08-22 NOTE — Anesthesia Procedure Notes (Signed)
Anesthesia Regional Block: Ankle block   Pre-Anesthetic Checklist: ,, timeout performed, Correct Patient, Correct Site, Correct Laterality, Correct Procedure, Correct Position, site marked, Risks and benefits discussed,  Surgical consent,  Pre-op evaluation,  At surgeon's request and post-op pain management  Laterality: Left and Lower  Prep: chloraprep       Needles:   Needle Type: Quincke     Needle Length: 4cm  Needle Gauge: 25     Additional Needles:   Procedures:,,,, other,,,,  (perineural infiltration)  Narrative:  Start time: 08/22/2016 12:17 PM End time: 08/22/2016 12:24 PM Injection made incrementally with aspirations every 5 mL.  Performed by: Personally  Anesthesiologist: Glennon Mac, Jalana Moore  Additional Notes: Pt identified in Holding room.  Monitors applied. Working IV access confirmed. Sterile prep L ankle.  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  40cc 0.5% Ropivacaine injected incrementally after negative aspiration.  Patient asymptomatic, VSS, no heme aspirated, tolerated well.  Jenita Seashore, MD

## 2016-08-22 NOTE — Transfer of Care (Signed)
Immediate Anesthesia Transfer of Care Note  Patient: [name]  Procedure(s) Performed: Procedure(s): GREAT TOE AMPUTATION (Left)  Patient Location: PACU  Anesthesia Type:MAC, Regional and MAC combined with regional for post-op pain  Level of Consciousness: awake, oriented, sedated, patient cooperative and responds to stimulation  Airway & Oxygen Therapy: Patient Spontanous Breathing  Post-op Assessment: Report given to RN, Post -op Vital signs reviewed and stable, Patient moving all extremities and Patient moving all extremities X 4  Post vital signs: Reviewed and stable  Last Vitals:  Vitals:   08/22/16 1220 08/22/16 1225  BP: (!) 187/94 (!) 177/103  Pulse: 92 90  Resp: (!) 21 12  Temp:      Last Pain:  Vitals:   08/22/16 1220  TempSrc:   PainSc: 0-No pain      Patients Stated Pain Goal: 2 (03/47/42 5956)  Complications: No apparent anesthesia complications

## 2016-08-22 NOTE — Interval H&P Note (Signed)
History and Physical Interval Note:  08/22/2016 7:32 AM  [name]  has presented today for surgery, with the diagnosis of osteomyelitis  The various methods of treatment have been discussed with the patient and family. After consideration of risks, benefits and other options for treatment, the patient has consented to  Procedure(s): GREAT TOE AMPUTATION (Left) as a surgical intervention .  The patient's history has been reviewed, patient examined, no change in status, stable for surgery.  I have reviewed the patient's chart and labs.  Questions were answered to the patient's satisfaction.     [name]

## 2016-08-22 NOTE — Op Note (Signed)
08/20/2016 - 08/22/2016  2:30 PM  PATIENT:  [name]    PRE-OPERATIVE DIAGNOSIS:  osteomyelitis left great toe  POST-OPERATIVE DIAGNOSIS:  Same  PROCEDURE:  GREAT TOE AMPUTATION  SURGEON:  [name], MD  PHYSICIAN ASSISTANT:None ANESTHESIA:   General  PREOPERATIVE INDICATIONS:  [name] is a  46 y.o. male with a diagnosis of osteomyelitis who failed conservative measures and elected for surgical management.    The risks benefits and alternatives were discussed with the patient preoperatively including but not limited to the risks of infection, bleeding, nerve injury, cardiopulmonary complications, the need for revision surgery, among others, and the patient was willing to proceed.  OPERATIVE IMPLANTS: None  OPERATIVE FINDINGS: No abscess at the MTP joint  OPERATIVE PROCEDURE: Patient was brought to the operating room after undergoing regional anesthetic. After adequate levels anesthesia were obtained patient's left lower extremity was prepped using DuraPrep draped into a sterile field a timeout was called. A fishmouth incision was just made distal to the MTP joint. The great toe was amputated through the MTP joint. Electrocautery was used for hemostasis the wound was irrigated with normal saline. There is no abscess no evidence of involvement of the MTP joint. The skin was closed using 2-0 nylon a sterile compressive dressing was applied patient was taken the PACU in stable condition.  Anticipate patient could discharge to home tomorrow does not need antibiotics for discharge.

## 2016-08-22 NOTE — Progress Notes (Signed)
Subjective: He is feeling well this morning without new complaints. He has mild left foot pain, no fevers or chills. NPO sinc midnight anticipating amputation of L great toe this morning.  Objective:  Vital signs in last 24 hours: Vitals:   08/21/16 0400 08/21/16 1722 08/21/16 2024 08/22/16 0319  BP: (!) 153/84 (!) 152/85 134/75 136/77  Pulse: 79 93 96 87  Resp: 18  18 18   Temp: 99.3 F (37.4 C) 98.6 F (37 C) 99.4 F (37.4 C) 99 F (37.2 C)  TempSrc: Oral Oral Oral Oral  SpO2: 100% 100% 100% 100%  Weight:      Height:       Physical Exam  Constitutional: He appears well-developed and well-nourished. No distress.  Cardiovascular: Normal rate, regular rhythm and intact distal pulses.   Pulmonary/Chest: Effort normal and breath sounds normal.  Musculoskeletal: He exhibits no edema or tenderness.  Psychiatric: He has a normal mood and affect. His behavior is normal.   CBC Latest Ref Rng & Units 08/22/2016 08/21/2016 08/20/2016  WBC 4.0 - 10.5 K/uL 14.3(H) 13.9(H) 15.1(H)  Hemoglobin 13.0 - 17.0 g/dL 11.1(L) 10.2(L) 11.6(L)  Hematocrit 39.0 - 52.0 % 35.7(L) 32.5(L) 36.5(L)  Platelets 150 - 400 K/uL 303 290 300   CMP Latest Ref Rng & Units 08/22/2016 08/21/2016 08/20/2016  Glucose 65 - 99 mg/dL 243(H) 169(H) 208(H)  BUN 6 - 20 mg/dL 20 15 15   Creatinine 0.61 - 1.24 mg/dL 1.13 1.16 1.17  Sodium 135 - 145 mmol/L 139 136 137  Potassium 3.5 - 5.1 mmol/L 4.0 3.4(L) 3.9  Chloride 101 - 111 mmol/L 106 105 104  CO2 22 - 32 mmol/L 28 25 27   Calcium 8.9 - 10.3 mg/dL 8.7(L) 8.2(L) 8.7(L)  Total Protein 6.5 - 8.1 g/dL - - 6.9  Total Bilirubin 0.3 - 1.2 mg/dL - - 0.2(L)  Alkaline Phos 38 - 126 U/L - - 95  AST 15 - 41 U/L - - 14(L)  ALT 17 - 63 U/L - - 13(L)    Assessment/Plan:  Active Problems:   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 male with diabetes and osteomyelitis of left great toe secondary to a diabetic  foot ulcer. He is afebrile and hemodynamically stable with leukocytosis improving on antibiotics. Plan for amputation 5/27.  #Osteomyelitis of L Great Toe #Diabetic Toe Ulcer Osteomyelitis findings on MRI including distal and possible proximal phalanx of the L great toe, elevated inflammatory markers, and persistently elevated leukocytosis. He has palpable pulses bilaterally suggesting fair perfusion of his extremities which is reassuring for would healing. ABIs were obtained at [facility] based clinic but specific results not available for review at this time. He is scheduled for amputation today which should be curative and likely will not need antibiotics past his surgery. He needs assistance with continued smoking abstinence and we will offer nicotine replacement therapy and follow up at clinic.  #DM On Humalog 10U once daily with dinner and levemir 30U QHS. He has been very uncontrolled lately due to lack of medication access. We will arrange close follow up in [facility]. His medication access could be a barrier to leaving today with outpt pharmacy closed and no insurance coverage. I will consult case management about him for these issues.  #HTN -continue home lisinopril 10 mg daily and amlodipine 5 mg daily  #Onychomycosis -continue terbinafine 250 mg PO daily  Dispo: Anticipated discharge in approximately 1 day(s).  Collier Salina, MD PGY-II Internal Medicine Resident Pager# [phone] 08/22/2016, 9:22 AM

## 2016-08-22 NOTE — Anesthesia Preprocedure Evaluation (Addendum)
Anesthesia Evaluation  Patient identified by MRN, date of birth, ID band Patient awake    Reviewed: Allergy & Precautions, NPO status , Patient's Chart, lab work & pertinent test results  History of Anesthesia Complications Negative for: history of anesthetic complications  Airway Mallampati: II  TM Distance: >3 FB Neck ROM: Full    Dental  (+) Poor Dentition, Loose, Missing, Chipped, Dental Advisory Given   Pulmonary COPD, Current Smoker,    breath sounds clear to auscultation       Cardiovascular hypertension, Pt. on medications and Pt. on home beta blockers (-) angina Rhythm:Regular Rate:Normal  '16 Dobutamine stress: Normal left ventricular function and global wall motion with stress. Global  LV function is preserved with stress, EF 60-65% '16 ECHO: EF 55-60%, wall motion and valves OK   Neuro/Psych negative neurological ROS     GI/Hepatic Neg liver ROS, GERD  Medicated and Controlled,  Endo/Other  diabetes (glu 87), Insulin Dependent, Oral Hypoglycemic Agents  Renal/GU negative Renal ROS     Musculoskeletal   Abdominal   Peds  Hematology   Anesthesia Other Findings   Reproductive/Obstetrics                            Anesthesia Physical Anesthesia Plan  ASA: III  Anesthesia Plan: Regional   Post-op Pain Management:    Induction:   Airway Management Planned: Natural Airway and Nasal Cannula  Additional Equipment:   Intra-op Plan:   Post-operative Plan:   Informed Consent: I have reviewed the patients History and Physical, chart, labs and discussed the procedure including the risks, benefits and alternatives for the proposed anesthesia with the patient or authorized representative who has indicated his/her understanding and acceptance.   Dental advisory given  Plan Discussed with: CRNA and Surgeon  Anesthesia Plan Comments: (Plan routine monitors, ankle block with MAC)         Anesthesia Quick Evaluation

## 2016-08-22 NOTE — Progress Notes (Signed)
Orthopedic Tech Progress Note Patient Details:  [name] November 16, 1970 [id number]  Ortho Devices Type of Ortho Device: Postop shoe/boot Ortho Device/Splint Location: LLE Ortho Device/Splint Interventions: Ordered, Application   [name] 08/22/2016, 3:24 PM

## 2016-08-22 NOTE — H&P (View-Only) (Signed)
ORTHOPAEDIC CONSULTATION  REQUESTING PHYSICIAN: Bartholomew Crews, MD  Chief Complaint: Pain and swelling and drainage left great toe for several months.  HPI: [name] is a 46 y.o. male who presents with chronic ulceration left great toe with drainage pain and swelling. Patient has diabetic insensate neuropathy most recently moved to [facility] from [facility]  Past Medical History:  Diagnosis Date  . Diabetes mellitus type II, uncontrolled ([place])   . Hyperlipidemia   . Hypertension   . Other and unspecified angina pectoris   . Tobacco use    Past Surgical History:  Procedure Laterality Date  . LEFT HEART CATHETERIZATION WITH CORONARY ANGIOGRAM N/A 07/31/2013   Procedure: LEFT HEART CATHETERIZATION WITH CORONARY ANGIOGRAM;  Surgeon: Jettie Booze, MD;  Location: [facility] CATH LAB;  Service: Cardiovascular;  Laterality: N/A;  . spinal tap     Social History   Social History  . Marital status: Single    Spouse name: N/A  . Number of children: N/A  . Years of education: N/A   Occupational History  . 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 Smoking status: Current Every Day Smoker    Packs/day: 1.00    Years: 20.00    Types: Cigarettes  . Smokeless tobacco: Never Used  . Alcohol use Yes     Comment: occ  . Drug use: No  . Sexual activity: Not Currently   Other Topics Concern  . None   Social History Narrative   Works in [employer], recently moved in early 2018 from [place]. Previous saw free clinic providers.    Family History  Problem Relation Age of Onset  . Cancer Mother   . Diabetes Father   . Hypertension Father    - negative except otherwise stated in the family history section Allergies  Allergen Reactions  . Bee Venom Swelling  . Penicillins Hives   Prior to Admission medications   Medication Sig Start Date End Date Taking? Authorizing Provider  amLODipine (NORVASC) 5 MG tablet  Take 1 tablet (5 mg total) by mouth daily. 07/12/14   Leonard Schwartz, MD  atorvastatin (LIPITOR) 20 MG tablet Take 1 tablet (20 mg total) by mouth daily. 12/02/13   Malvin Johns, MD  Cyanocobalamin (VITAMIN B-12 PO) Take 1 capsule by mouth daily.    [provider]  doxycycline (VIBRAMYCIN) 100 MG capsule Take 1 capsule (100 mg total) by mouth 2 (two) times daily. One po bid x 7 days Patient not taking: Reported on 03/06/2014 12/02/13   Malvin Johns, MD  gabapentin (NEURONTIN) 300 MG capsule Take 1 capsule (300 mg total) by mouth 3 (three) times daily. 07/12/14   Leonard Schwartz, MD  Insulin Detemir (LEVEMIR FLEXPEN) 100 UNIT/ML Pen Inject 30 Units into the skin at bedtime. 07/12/14   Leonard Schwartz, MD  insulin lispro (HUMALOG KWIKPEN) 100 UNIT/ML KiwkPen Use 10 units 10-15 minutes before your evening meal 07/12/14   Leonard Schwartz, MD  lisinopril (PRINIVIL,ZESTRIL) 20 MG tablet Take 1 tablet (20 mg total) by mouth daily. 07/12/14   Leonard Schwartz, MD  metFORMIN (GLUCOPHAGE) 500 MG tablet Take 1 tablet (500 mg total) by mouth 2 (two) times daily with a meal. 07/12/14   Leonard Schwartz, MD  mupirocin cream (BACTROBAN) 2 % Apply 1 application topically 2 (two) times daily. Patient not taking: Reported on 03/06/2014 12/02/13   Malvin Johns, MD  nitroGLYCERIN (NITROSTAT) 0.4 MG SL tablet  Place 1 tablet (0.4 mg total) under the tongue every 5 (five) minutes as needed for chest pain. Patient not taking: Reported on 03/06/2014 07/26/13   Jettie Booze, MD  omeprazole (PRILOSEC) 20 MG capsule Take 1 capsule (20 mg total) by mouth daily. 12/02/13   Malvin Johns, MD  omeprazole (PRILOSEC) 20 MG capsule Take 1 capsule (20 mg total) by mouth daily. 03/06/14   Varney Biles, MD  traMADol (ULTRAM) 50 MG tablet Take 1 tablet (50 mg total) by mouth every 6 (six) hours as needed. 03/06/14   Varney Biles, MD   Dg Toe Great Left  Result Date: 08/20/2016 CLINICAL DATA:  Diabetic with left great toe  infection. Presents with black, swollen toe. Necrosis. EXAM: LEFT GREAT TOE COMPARISON:  None. FINDINGS: There is soft tissue swelling throughout the great toe. There is soft tissue emphysema distally, undermining the nail bed. No evidence of foreign body. Allowing for the overlying soft tissue emphysema, no definite bone destruction to suggest osteomyelitis. No evidence of acute fracture or dislocation. There are prominent vascular calcifications, typical of diabetes. IMPRESSION: Soft tissue swelling and emphysema in the great toe. No radiographic evidence of osteomyelitis. Electronically Signed   By: Richardean Sale M.D.   On: 08/20/2016 17:12   - pertinent xrays, CT, MRI studies were reviewed and independently interpreted  Positive ROS: All other systems have been reviewed and were otherwise negative with the exception of those mentioned in the HPI and as above.  Physical Exam: General: Alert, no acute distress Psychiatric: Patient is competent for consent with normal mood and affect Lymphatic: No axillary or cervical lymphadenopathy Cardiovascular: No pedal edema Respiratory: No cyanosis, no use of accessory musculature GI: No organomegaly, abdomen is soft and non-tender  Skin: Examination patient has macerated skin around the left great toe. He has ulceration that extends down to bone.   Neurologic: Patient does not have protective sensation bilateral lower extremities.   MUSCULOSKELETAL:  Examination of the left foot patient has a good dorsalis pedis pulse he has swelling ulceration and maceration of the left great toe. Radiographs shows a lytic changes consistent with osteomyelitis.  Assessment: Assessment: Osteomyelitis ulceration left great toe with diabetic insensate neuropathy.  Plan: Plan: We'll plan for an amputation of left great toe tomorrow Sunday at the MTP joint. Patient can be discharged postoperatively once he is safe with ambulation with touchdown weightbearing on the  left.  Thank you for the consult and the opportunity to see [name], [place] [phone] 8:13 AM

## 2016-08-22 NOTE — Anesthesia Postprocedure Evaluation (Signed)
Anesthesia Post Note  Patient: [name]  Procedure(s) Performed: Procedure(s) (LRB): GREAT TOE AMPUTATION (Left)  Patient location during evaluation: PACU Anesthesia Type: Regional and MAC Level of consciousness: awake and alert, patient cooperative and oriented Pain management: pain level controlled Vital Signs Assessment: post-procedure vital signs reviewed and stable Respiratory status: spontaneous breathing, nonlabored ventilation and respiratory function stable Cardiovascular status: blood pressure returned to baseline and stable Postop Assessment: no signs of nausea or vomiting Anesthetic complications: no       Last Vitals:  Vitals:   08/22/16 1220 08/22/16 1225  BP: (!) 187/94 (!) 177/103  Pulse: 92 90  Resp: (!) 21 12  Temp:      Last Pain:  Vitals:   08/22/16 1220  TempSrc:   PainSc: 0-No pain                 Fredderick Swanger,E. Jossilyn Benda

## 2016-08-23 ENCOUNTER — Other Ambulatory Visit: Payer: Self-pay

## 2016-08-23 ENCOUNTER — Encounter (HOSPITAL_COMMUNITY): Payer: Self-pay | Admitting: Orthopedic Surgery

## 2016-08-23 DIAGNOSIS — E119 Type 2 diabetes mellitus without complications: Secondary | ICD-10-CM

## 2016-08-23 LAB — GLUCOSE, CAPILLARY
GLUCOSE-CAPILLARY: 197 mg/dL — AB (ref 65–99)
Glucose-Capillary: 118 mg/dL — ABNORMAL HIGH (ref 65–99)
Glucose-Capillary: 145 mg/dL — ABNORMAL HIGH (ref 65–99)

## 2016-08-23 MED ORDER — LISINOPRIL 20 MG PO TABS: 20.0000 mg | ORAL_TABLET | Freq: Every day | ORAL | 0 refills | Status: DC

## 2016-08-23 MED ORDER — ATORVASTATIN CALCIUM 20 MG PO TABS: 20.0000 mg | ORAL_TABLET | Freq: Every day | ORAL | 0 refills | Status: DC

## 2016-08-23 MED ORDER — OXYCODONE HCL 5 MG PO TABS: 5.0000 mg | ORAL_TABLET | ORAL | 0 refills | Status: AC | PRN

## 2016-08-23 MED ORDER — SENNOSIDES-DOCUSATE SODIUM 8.6-50 MG PO TABS: ORAL_TABLET | ORAL | 0 refills | Status: DC

## 2016-08-23 MED ORDER — TERBINAFINE HCL 250 MG PO TABS: 250.0000 mg | ORAL_TABLET | Freq: Every day | ORAL | 0 refills | Status: DC

## 2016-08-23 MED ORDER — IBUPROFEN 200 MG PO TABS
600.0000 mg | ORAL_TABLET | Freq: Four times a day (QID) | ORAL | Status: DC | PRN
  Administered 2016-08-24: 600 mg via ORAL
  Filled 2016-08-23: qty 3

## 2016-08-23 MED ORDER — INSULIN DETEMIR 100 UNIT/ML FLEXPEN: 30.0000 [IU] | PEN_INJECTOR | Freq: Every day | SUBCUTANEOUS | 0 refills | Status: DC

## 2016-08-23 MED ORDER — ACETAMINOPHEN 325 MG PO TABS: 650.0000 mg | ORAL_TABLET | Freq: Four times a day (QID) | ORAL | Status: AC | PRN

## 2016-08-23 MED ORDER — METOPROLOL TARTRATE 25 MG PO TABS: 25.0000 mg | ORAL_TABLET | Freq: Two times a day (BID) | ORAL | 0 refills | Status: DC

## 2016-08-23 MED ORDER — OXYCODONE HCL 5 MG PO TABS: 5.0000 mg | ORAL_TABLET | ORAL | 0 refills | Status: DC | PRN

## 2016-08-23 MED ORDER — METFORMIN HCL 500 MG PO TABS: 500.0000 mg | ORAL_TABLET | Freq: Two times a day (BID) | ORAL | 0 refills | Status: DC

## 2016-08-23 MED ORDER — NICOTINE 21 MG/24HR TD PT24: 21.0000 mg | MEDICATED_PATCH | Freq: Every day | TRANSDERMAL | 0 refills | Status: DC

## 2016-08-23 MED ORDER — INSULIN LISPRO 100 UNIT/ML (KWIKPEN): 10.0000 [IU] | PEN_INJECTOR | Freq: Every evening | SUBCUTANEOUS | 0 refills | Status: DC

## 2016-08-23 MED ORDER — METFORMIN HCL 500 MG PO TABS
500.0000 mg | ORAL_TABLET | Freq: Two times a day (BID) | ORAL | Status: DC
  Administered 2016-08-23 – 2016-08-25 (×3): 500 mg via ORAL
  Filled 2016-08-23 (×4): qty 1

## 2016-08-23 MED ORDER — GABAPENTIN 300 MG PO CAPS
300.0000 mg | ORAL_CAPSULE | Freq: Three times a day (TID) | ORAL | 0 refills | Status: DC
Start: 2016-08-23 — End: 2017-06-01

## 2016-08-23 MED ORDER — AMLODIPINE BESYLATE 5 MG PO TABS: 5.0000 mg | ORAL_TABLET | Freq: Every day | ORAL | 0 refills | Status: DC

## 2016-08-23 NOTE — Progress Notes (Signed)
Patient sitting by window when I entered the room.  Stated was light-headed and proceeded to vomit.  Did not consume supper meal.  CBG 118 at this time.  V/S stable.  No complaints of chest pain.  Peri-orbital edema present.. C/O stomach "tightness".

## 2016-08-23 NOTE — Progress Notes (Signed)
   Subjective: No acute events overnight, POD1 from left great toe amputation.  Pain has been rising to 7/10 between doses of pain meds, but is gone for a couple of hours after oxycodone.  Has been up and walking with walker without incident.  Objective:  Vital signs in last 24 hours: Vitals:   08/22/16 1526 08/22/16 2111 08/23/16 0029 08/23/16 0434  BP: (!) 178/100 (!) 162/82 (!) 142/75 (!) 160/88  Pulse: 87 98 92 91  Resp: 14 16 16 16   Temp: 97.7 F (36.5 C) 98.7 F (37.1 C) 98.8 F (37.1 C) 99 F (37.2 C)  TempSrc:  Oral Oral Oral  SpO2: 100% 100% 100% 99%  Weight:      Height:       Physical Exam  Constitutional: He is oriented to person, place, and time. He appears well-developed and well-nourished. No distress.  Cardiovascular: Normal rate and regular rhythm.   DP pulses 3+ bilaterally  Pulmonary/Chest: Effort normal and breath sounds normal.  Musculoskeletal:  Left foot wrapped in clean bandage Mild-moderate circumferential tenderness to palpation to mid shin on left, minimal edema, no erythema or streaking  Neurological: He is alert and oriented to person, place, and time.  Psychiatric: He has a normal mood and affect. His behavior is normal.   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 male with diabetes and osteomyelitis of left great toe secondary to a diabetic foot ulcer. He is afebrile and hemodynamically stable with leukocytosis improving on antibiotics, now status post amputation of his great toe on 5/27.  Ready for discharge once evaluated and provided with gait training by PT.  #Osteomyelitis of L Great Toe #Diabetic Toe Ulcer POD1 great toe amputation by Dr Sharol Given, recovering well -pain control with tylenol, ibuprofen, and oxycodone PRN -follow up w/ Dr Sharol Given in 1 week  #DM On Humalog 10U once daily with dinner and levemir 30U QHS. -SSI -resume home  metformin 500 mg BID  #HTN -continue home lisinopril 10 mg daily and amlodipine 5 mg daily  #Onychomycosis -continue terbinafine 250 mg PO daily  Fluids: none Diet: Carb DVT Prophylaxis: lovenox Code Status: full  Dispo: Anticipated discharge home today.  Minus Liberty, MD 08/23/2016, 11:04 AM Pager: [phone]

## 2016-08-23 NOTE — Progress Notes (Signed)
Patient continues to complain of "lightheadedness", but feels noticeably better.  Able to walk around room independently with contact guard assist.  States does not feel comfortable to go home at this time and wishes to stay one more night due to feeling of 'nearly passing out'.  Discussed episode with MD as well as his vital signs and present status.  Patient states requires [place] for ambulation at home and that his father is blind, debilitated, and lives with him.  Reviewed some discharge material with him and removed IV as per his request.

## 2016-08-23 NOTE — Progress Notes (Signed)
Patient ID: [name], male   DOB: Aug 20, 1970, 46 y.o.   MRN: [id number] Postoperative day 1 amputation great toe. No signs of infection at the MTP joint. Patient may discharge to home without oral antibiotics touchdown weightbearing follow-up in the office in 1 week

## 2016-08-23 NOTE — Evaluation (Signed)
Physical Therapy Evaluation Patient Details Name: [name] MRN: [id number] DOB: 1971-01-29 Today's Date: 08/23/2016   History of Present Illness  [name] is a  46 y.o. male with a diagnosis of osteomyelitis who failed conservative measures and elected for surgical management.  Left great toe amputation.   Clinical Impression  Pt admitted with above diagnosis. Pt currently with functional limitations due to the deficits listed below (see PT Problem List). Pt was able to ambulate with RW with good stability overall.  Cues needed for TDWB left LE at times.  Will do well at home.  May need a tray for RW so he can carry things to his dad.   Pt encouraged to purchase a tray at equipment store. Will follow acutely. Pt will benefit from skilled PT to increase their independence and safety with mobility to allow discharge to the venue listed below.     Follow Up Recommendations No PT follow up;Supervision - Intermittent    Equipment Recommendations  Rolling walker with 5" wheels , tray for RW.   Recommendations for Other Services       Precautions / Restrictions Precautions Precautions: Fall Required Braces or Orthoses: Other Brace/Splint Other Brace/Splint: cast shoe left foot Restrictions Weight Bearing Restrictions: Yes LLE Weight Bearing: Touchdown weight bearing      Mobility  Bed Mobility Overal bed mobility: Independent                Transfers Overall transfer level: Needs assistance Equipment used: Rolling walker (2 wheeled) Transfers: Sit to/from Stand Sit to Stand: Supervision         General transfer comment: cues for hand placement as pt attempting to pull up on RW initially.   Ambulation/Gait Ambulation/Gait assistance: Supervision Ambulation Distance (Feet): 100 Feet Assistive device: Rolling walker (2 wheeled) Gait Pattern/deviations: Step-to pattern;Decreased step length - left;Decreased stance time - left;Decreased weight shift to  left;Decreased dorsiflexion - left;Antalgic;Wide base of support   Gait velocity interpretation: Below normal speed for age/gender General Gait Details: Pt needed cues to sequence steps and RW as well as cues for TDWB.  Pt needed incr cues for TDWB as he fatigued. Overall, should do well.  Pt encouraged to ambulate short distances so thatt he could keep weight off of left LE.   Stairs            Wheelchair Mobility    Modified Rankin (Stroke Patients Only)       Balance Overall balance assessment: Needs assistance Sitting-balance support: No upper extremity supported;Feet supported Sitting balance-Leahy Scale: Good     Standing balance support: Bilateral upper extremity supported;During functional activity Standing balance-Leahy Scale: Fair Standing balance comment: can stand statically without RW                             Pertinent Vitals/Pain Pain Assessment: 0-10 Pain Score: 5  Pain Location: left toe Pain Descriptors / Indicators: Aching;Grimacing;Guarding Pain Intervention(s): Limited activity within patient's tolerance;Monitored during session;Repositioned;Premedicated before session    Home Living Family/patient expects to be discharged to:: Private residence Living Arrangements: Parent Available Help at Discharge: Family;Available PRN/intermittently Type of Home: House Home Access: Level entry     Home Layout: One level Home Equipment: Shower seat Additional Comments: Siblings will check in on pt and father    Prior Function Level of Independence: Independent         Comments: 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  Father needs  assist with meal prep etc which pt states he can perform.       Hand Dominance        Extremity/Trunk Assessment   Upper Extremity Assessment Upper Extremity Assessment: Defer to OT evaluation    Lower Extremity Assessment Lower Extremity Assessment: LLE deficits/detail LLE Deficits / Details: ankle NT  secondary to pain, knee and hip WNL LLE Sensation: decreased light touch    Cervical / Trunk Assessment Cervical / Trunk Assessment: Normal  Communication   Communication: No difficulties  Cognition Arousal/Alertness: Awake/alert Behavior During Therapy: WFL for tasks assessed/performed Overall Cognitive Status: Within Functional Limits for tasks assessed                                        General Comments      Exercises     Assessment/Plan    PT Assessment Patient needs continued PT services  PT Problem List Decreased activity tolerance;Decreased balance;Decreased mobility;Decreased knowledge of use of DME;Decreased safety awareness;Decreased knowledge of precautions;Pain       PT Treatment Interventions DME instruction;Gait training;Functional mobility training;Therapeutic activities;Therapeutic exercise;Balance training;Patient/family education    PT Goals (Current goals can be found in the Care Plan section)  Acute Rehab PT Goals Patient Stated Goal: to go home PT Goal Formulation: With patient Time For Goal Achievement: 08/30/16 Potential to Achieve Goals: Good    Frequency Min 3X/week   Barriers to discharge Decreased caregiver support pt takes care of his blind father.     Co-evaluation               AM-PAC PT "6 Clicks" Daily Activity  Outcome Measure Difficulty turning over in bed (including adjusting bedclothes, sheets and blankets)?: None Difficulty moving from lying on back to sitting on the side of the bed? : None Difficulty sitting down on and standing up from a chair with arms (e.g., wheelchair, bedside commode, etc,.)?: None Help needed moving to and from a bed to chair (including a wheelchair)?: A Little Help needed walking in hospital room?: A Little Help needed climbing 3-5 steps with a railing? : A Little 6 Click Score: 21    End of Session Equipment Utilized During Treatment: Gait belt Activity Tolerance: Patient  limited by fatigue;Patient limited by pain Patient left: in chair;with call bell/phone within reach;with family/visitor present Nurse Communication: Mobility status PT Visit Diagnosis: Muscle weakness (generalized) (M62.81);Pain Pain - Right/Left: Left Pain - part of body: Ankle and joints of foot    Time: 1153-1205 PT Time Calculation (min) (ACUTE ONLY): 12 min   Charges:   PT Evaluation $PT Eval Low Complexity: 1 Procedure     PT G Codes:        Kimani Hovis,PT Acute Rehabilitation [phone] (pager)   Denice [name] 08/23/2016, 12:27 PM

## 2016-08-23 NOTE — Discharge Summary (Signed)
Name: [name] MRN: [id number] DOB: Jul 11, 1970 46 y.o. 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      Medication List    STOP taking these medications   insulin lispro 100 UNIT/ML KiwkPen Commonly known as:  HUMALOG KWIKPEN Replaced by:  insulin lispro 100 UNIT/ML injection   traMADol 50 MG tablet Commonly known as:  ULTRAM     TAKE these medications   acetaminophen 325 MG tablet Commonly known as:  TYLENOL Take 2 tablets (650 mg total) by mouth every 6 (six) hours as needed for mild pain (or Fever >/= 101).   amLODipine 5 MG tablet Commonly known as:  NORVASC Take 1 tablet (5 mg total) by mouth daily.   aspirin EC 81 MG tablet Take 81 mg by mouth daily.   atorvastatin 20 MG tablet Commonly known as:  LIPITOR Take 1 tablet (20 mg total) by mouth daily.   blood glucose meter kit and supplies Kit Dispense based on patient and insurance preference. Use up to four times daily as directed. (FOR ICD-9 250.00, 250.01).   gabapentin 300 MG capsule Commonly known as:  NEURONTIN Take 1  capsule (300 mg total) by mouth 3 (three) times daily.   insulin detemir 100 UNIT/ML injection Commonly known as:  LEVEMIR Inject 0.1 mLs (10 Units total) into the skin at bedtime. What changed:  how much to take   insulin lispro 100 UNIT/ML injection Commonly known as:  HUMALOG Inject 0.1 mLs (10 Units total) into the skin daily with supper. Replaces:  insulin lispro 100 UNIT/ML KiwkPen   INSULIN SYRINGE .5CC/31GX5/16" 31G X 5/16" 0.5 ML Misc 1 each by Does not apply route as needed.   lisinopril 20 MG tablet Commonly known as:  PRINIVIL,ZESTRIL Take 1 tablet (20 mg total) by mouth daily.   metFORMIN 500 MG tablet Commonly known as:  GLUCOPHAGE Take 1 tablet (500 mg total) by mouth 2 (two) times daily with a meal.   metoprolol tartrate 25 MG tablet Commonly known as:  LOPRESSOR Take 1 tablet (25 mg total) by mouth 2 (two) times daily.   nicotine 21 mg/24hr patch Commonly known as:  NICODERM CQ - dosed in mg/24 hours Place 1 patch (21 mg total) onto the skin daily.   nitroGLYCERIN 0.4 MG SL tablet Commonly known as:  NITROSTAT Place 1 tablet (0.4 mg total) under the tongue every 5 (five) minutes as needed for chest pain.   omeprazole 20 MG capsule Commonly known as:  PRILOSEC Take 1 capsule (20 mg total) by mouth daily.   ondansetron 4 MG tablet Commonly known as:  ZOFRAN Take 1 tablet (4 mg total)  by mouth every 6 (six) hours as needed for nausea.   oxyCODONE 5 MG immediate release tablet Commonly known as:  Oxy IR/ROXICODONE Take 1-2 tablets (5-10 mg total) by mouth every 4 (four) hours as needed for breakthrough pain.   senna-docusate 8.6-50 MG tablet Commonly known as:  Senokot-S While taking oxycodone, take 1-2 pills twice daily as needed to have a bowel movement daily.   sildenafil 20 MG tablet Commonly known as:  REVATIO Take 20 mg by mouth daily as needed for erectile dysfunction.   VITAMIN B-12 PO Take 1 capsule by mouth daily.              Durable Medical Equipment        Start     Ordered   08/23/16 1235  For home use only DME Walker rolling  Once    Question:  Patient needs a walker to treat with the following condition  Answer:  Toe amputation status ([place])   08/23/16 1234      Disposition and follow-up:   [name] was discharged from [facility] in Good condition.  At the hospital follow up visit please address:  1.  Status Post Left Great Toe Amputation for Osteomyelitis.  Ask about ambulation, pain, swelling, and systemic signs of infection. Ensure follow-up with orthopedic surgery.  2.  DM.  Please titrate insulin and hypoglycemics as indicated.  3.  Tobacco Abuse.  Encourage tobacco cessation and offered pharmacological support as needed.  2.  Labs / imaging needed at time of follow-up: none  3.  Pending labs/ test needing follow-up: none  Follow-up Appointments: Follow-up Information    [name], MD Follow up in 1 week(s).   Specialty:  Orthopedic Surgery Why:  Please call to [facility] appointment. Contact information: [place] [address] [phone]        [place]. Go on 09/01/2016.   Why:  at 10:15am Contact information: 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 Osteomyelitis of Left Great Toe [name] is a 46 year old man with history of poorly controlled diabetes who was admitted with worsening pain and purulent discharge from his left great toe, which has had diabetic ulcer for past several months. He is afebrile and hemodynamically stable had leukocytosis to 15. MRI of the left foot suggested osteomyelitis of the  distal phalanx of left great toe. Orthopedic surgery was consulted, and the great toe was amputated by Dr. Sharol Given on 9/14 complication. His discharge was delayed by nausea, vomiting, and constipation which resolved after escalating bowel regimen and having bowel movements. He was eating and drinking well, pain improved, and able to bear weight and ambulate with walker prior to discharge. He will follow-up with and establish care at the [facility] and follow-up with his orthopedic surgeon next week.  2. DM Controlled diabetes with A1c 9.8% at this admission. He has had difficulties affording diabetes medicines, most recently on 70/30 insulin and metformin. He was maintained on sliding scale corrective insulin and started on 10 units of Levemir as an inpatient, and discharged with prescriptions for Levemir, Humalog once daily, and metformin.  He will need outpatient follow-up to continue  titrating insulin.  3. Tobacco Abuse Counseled on importance of tobacco cessation for wound healing. Motivated to quit smoking. Prescribed nicotine patches on discharge.  Discharge Vitals:   BP (!) 146/89 (BP Location: Right Arm)   Pulse 89   Temp 98.1 F (36.7 C) (Oral)   Resp 20   Ht _0  (1.651 m)   Wt 182 lb 1.6 oz (82.6 kg)   SpO2 100%   BMI 30.30 kg/m   Pertinent Labs, Studies, and Procedures:   CBC Latest Ref Rng & Units 08/24/2016 08/22/2016 08/21/2016  WBC 4.0 - 10.5 K/uL 13.1(H) 14.3(H) 13.9(H)  Hemoglobin 13.0 - 17.0 g/dL 11.1(L) 11.1(L) 10.2(L)  Hematocrit 39.0 - 52.0 % 35.1(L) 35.7(L) 32.5(L)  Platelets 150 - 400 K/uL 289 303 290   CMP Latest Ref Rng & Units 08/24/2016 08/22/2016 08/21/2016  Glucose 65 - 99 mg/dL 131(H) 243(H) 169(H)  BUN 6 - 20 mg/dL _1 Creatinine 0.61 - 1.24 mg/dL 1.00 1.13 1.16  Sodium 135 - 145 mmol/L 134(L) 139 136  Potassium 3.5 - 5.1 mmol/L 4.0 4.0 3.4(L)  Chloride 101 - 111 mmol/L 101 106 105  CO2 22 - 32 mmol/L _2 Calcium 8.9 - 10.3 mg/dL 8.7(L) 8.7(L) 8.2(L)    Total Protein 6.5 - 8.1 g/dL 6.5 - -  Total Bilirubin 0.3 - 1.2 mg/dL 0.5 - -  Alkaline Phos 38 - 126 U/L 65 - -  AST 15 - 41 U/L 29 - -  ALT 17 - 63 U/L 22 - -   Lab Results  Component Value Date   HGBA1C 9.8 (H) 08/20/2016   MRI Left Foot w/o Contrast 08/21/2016 IMPRESSION: 1. Prominent marrow T2 hyperintensity and cortical thinning of the distal first phalanx worrisome for osteomyelitis. Low-level edema in the head of the proximal phalanx is likely reactive. 2. Degenerative changes at the Lisfranc joint. 3. No evidence of soft tissue abscess. Nonspecific soft tissue edema in the dorsal forefoot and great toe. Possible soft tissue ulcer over the plantar aspect of the distal first phalanx.   Discharge Instructions: Discharge Instructions    Diet - low sodium heart healthy    Complete by:  As directed    Diet - low sodium heart healthy    Complete by:  As directed    Increase activity slowly    Complete by:  As directed    Increase activity slowly    Complete by:  As directed    Post-op shoe    Complete by:  As directed    Touch down weight bearing    Complete by:  As directed    Laterality:  left   Extremity:  Lower     You were admitted to the hospital for a bone infection (osteomyelitis) of your left big toe.  Because this is a problem that cannot be fully treated by antibiotics alone, the toe was amputated by Dr Sharol Given.  Most important thing that you can do going forward is making sure your blood sugars are well-controlled and that you quit smoking.  Make sure you keep your feet clean and dry, and get in the habit of looking them over every day to make sure there are no injuries or calluses he hadn't noticed.  Please make sure you follow-up with Dr. Sharol Given to make sure the wound is healing.   It is also important to see a medical doctor regularly; we are looking forward to seeing you at the [facility] to continue working  on your diabetes and smoking.   Please look over the [facility] information I have provided, fill out as much of it as you can, and bring it with you to your appointment at the [facility].  This is a program to provide you with insurance.  If you have fevers, chills, worse redness or swelling in your foot, or pus coming from the wound on your foot, please return to the emergency department.    Signed: Minus Liberty, MD 08/25/2016, 10:38 AM   Pager: [phone]

## 2016-08-23 NOTE — Care Management Note (Signed)
Case Management Note  Patient Details  Name: [name] MRN: [id number] Date of Birth: January 27, 1971  Subjective/Objective:     46 yr old gentleman admitted with foot ulcer, patient underwent a left great toe.            Action/Plan: patient has no HH needs, CM ordered RW and provided information for DSS given by Education officer, museum, also informed patient how to access system or FMLA. CM will arrange a followup appt and medical home for patient at [facility] on Tuesday and will contact patient.     Expected Discharge Date:  08/23/16               Expected Discharge Plan:  Home/Self Care  In-House Referral:  NA  Discharge planning Services  CM Consult (CM will arrange appt at [facility] and contact patient)  Post Acute Care Choice:  Durable Medical Equipment Choice offered to:  Patient  DME Arranged:  Walker rolling DME Agency:  [place]:    [place]:  NA  Status of Service:  Completed, signed off  If discussed at [place] of Stay Meetings, dates discussed:    Additional Comments:  Ninfa Meeker, RN 08/23/2016, 2:34 PM

## 2016-08-24 DIAGNOSIS — E11621 Type 2 diabetes mellitus with foot ulcer: Secondary | ICD-10-CM

## 2016-08-24 LAB — COMPREHENSIVE METABOLIC PANEL
ALT: 22 U/L (ref 17–63)
ANION GAP: 6 (ref 5–15)
AST: 29 U/L (ref 15–41)
Albumin: 2.8 g/dL — ABNORMAL LOW (ref 3.5–5.0)
Alkaline Phosphatase: 65 U/L (ref 38–126)
BUN: 12 mg/dL (ref 6–20)
CHLORIDE: 101 mmol/L (ref 101–111)
CO2: 27 mmol/L (ref 22–32)
Calcium: 8.7 mg/dL — ABNORMAL LOW (ref 8.9–10.3)
Creatinine, Ser: 1 mg/dL (ref 0.61–1.24)
GFR calc non Af Amer: >60 mL/min (ref >60)
Glucose, Bld: 131 mg/dL — ABNORMAL HIGH (ref 65–99)
POTASSIUM: 4 mmol/L (ref 3.5–5.1)
SODIUM: 134 mmol/L — AB (ref 135–145)
Total Bilirubin: 0.5 mg/dL (ref 0.3–1.2)
Total Protein: 6.5 g/dL (ref 6.5–8.1)

## 2016-08-24 LAB — CBC
HCT: 35.1 % — ABNORMAL LOW (ref 39.0–52.0)
Hemoglobin: 11.1 g/dL — ABNORMAL LOW (ref 13.0–17.0)
MCH: 27.1 pg (ref 26.0–34.0)
MCHC: 31.6 g/dL (ref 30.0–36.0)
MCV: 85.8 fL (ref 78.0–100.0)
PLATELETS: 289 10*3/uL (ref 150–400)
RBC: 4.09 MIL/uL — AB (ref 4.22–5.81)
RDW: 14.1 % (ref 11.5–15.5)
WBC: 13.1 10*3/uL — ABNORMAL HIGH (ref 4.0–10.5)

## 2016-08-24 LAB — GLUCOSE, CAPILLARY
GLUCOSE-CAPILLARY: 160 mg/dL — AB (ref 65–99)
GLUCOSE-CAPILLARY: 83 mg/dL (ref 65–99)
Glucose-Capillary: 138 mg/dL — ABNORMAL HIGH (ref 65–99)
Glucose-Capillary: 117 mg/dL — ABNORMAL HIGH (ref 65–99)

## 2016-08-24 MED ORDER — POLYETHYLENE GLYCOL 3350 17 G PO PACK: 17.0000 g | PACK | Freq: Once | ORAL | Status: DC

## 2016-08-24 MED ORDER — SENNOSIDES-DOCUSATE SODIUM 8.6-50 MG PO TABS
2.0000 | ORAL_TABLET | Freq: Two times a day (BID) | ORAL | Status: DC
  Administered 2016-08-24 – 2016-08-25 (×2): 2 via ORAL
  Filled 2016-08-24 (×2): qty 2

## 2016-08-24 MED ORDER — INSULIN DETEMIR 100 UNIT/ML ~~LOC~~ SOLN
10.0000 [IU] | Freq: Every day | SUBCUTANEOUS | Status: DC
  Administered 2016-08-24: 10 [IU] via SUBCUTANEOUS
  Filled 2016-08-24: qty 0.1

## 2016-08-24 MED ORDER — INSULIN LISPRO 100 UNIT/ML ~~LOC~~ SOLN: 10.0000 [IU] | Freq: Every day | SUBCUTANEOUS | 0 refills | Status: DC

## 2016-08-24 MED ORDER — ONDANSETRON HCL 4 MG PO TABS: 4.0000 mg | ORAL_TABLET | Freq: Four times a day (QID) | ORAL | 0 refills | Status: DC | PRN

## 2016-08-24 MED ORDER — SODIUM CHLORIDE 0.9 % IV SOLN
INTRAVENOUS | Status: DC
  Administered 2016-08-24: 15:00:00 via INTRAVENOUS
  Filled 2016-08-24 (×2): qty 1000

## 2016-08-24 MED ORDER — INSULIN DETEMIR 100 UNIT/ML ~~LOC~~ SOLN: 30.0000 [IU] | Freq: Every day | SUBCUTANEOUS | 0 refills | Status: DC

## 2016-08-24 MED ORDER — "INSULIN SYRINGE 31G X 5/16"" 0.5 ML MISC": 1.0000 | 0 refills | Status: DC | PRN

## 2016-08-24 MED FILL — AMLODIPINE BESYLATE 5 MG TA: 5 | 30 days supply | Qty: 30 | Fill #0

## 2016-08-24 MED FILL — HumaLOG 100 UNIT/ML SOLN: 100 | 30 days supply | Qty: 10 | Fill #0

## 2016-08-24 MED FILL — GABAPENTIN 300 MG CAPSULE: 300 | 30 days supply | Qty: 90 | Fill #0

## 2016-08-24 MED FILL — LISINOPRIL 20 MG TABLET: 20 | 30 days supply | Qty: 30 | Fill #0

## 2016-08-24 MED FILL — ATORVASTATIN 20 MG TABLET: 20 | 30 days supply | Qty: 30 | Fill #0

## 2016-08-24 NOTE — Progress Notes (Signed)
Physical Therapy Treatment Patient Details Name: [name] MRN: [id number] DOB: 25-Aug-1970 Today's Date: 08/24/2016    History of Present Illness [name] is a  46 y.o. male with a diagnosis of osteomyelitis who failed conservative measures and elected for surgical management.  Left great toe amputation.     PT Comments    Pt performed supine exercises during session to improve strength and promote functional independence with RW use.  Pt issued HEP for continued use at home.  Pt remains to present with difficulty maintaining weight bearing.  Pt required demonstration and max VCs to correct technique with 50% compliance.  Pt nauseated during session and RN into medicate during session. Plans to d/c home today.      Follow Up Recommendations  No PT follow up;Supervision - Intermittent     Equipment Recommendations  Rolling walker with 5" wheels    Recommendations for Other Services       Precautions / Restrictions Precautions Precautions: Fall Required Braces or Orthoses: Other Brace/Splint Other Brace/Splint: cast shoe left foot Restrictions Weight Bearing Restrictions: Yes LLE Weight Bearing: Touchdown weight bearing Other Position/Activity Restrictions: Pt putting weight through LLE and required re-education on weight bearing to comply with order from MD.  Pt able to maintain 50 % of the time and begins to put weight through his left heel despite cueing for weight bearing.      Mobility  Bed Mobility Overal bed mobility: Independent                Transfers Overall transfer level: Needs assistance Equipment used: Rolling walker (2 wheeled) Transfers: Sit to/from Stand Sit to Stand: Supervision         General transfer comment: Pt stood impulsively placing LLE down for support without use of RW.  Pt reqiored re-ed on correct use and assist from RW to maintain weight bearing.    Ambulation/Gait Ambulation/Gait assistance: Supervision Ambulation  Distance (Feet): 100 Feet Assistive device: Rolling walker (2 wheeled) Gait Pattern/deviations: Step-to pattern;Antalgic;Trunk flexed   Gait velocity interpretation: Below normal speed for age/gender General Gait Details: PTA adjusted RW height to improve fit.  Pt required demonstration for correct use of RW to maintain TDWB.  Pt required cues to keep RW close.  Pt fatigues quickly and begins to put weight through L heel despite cueing.     Stairs            Wheelchair Mobility    Modified Rankin (Stroke Patients Only)       Balance Overall balance assessment: Needs assistance Sitting-balance support: No upper extremity supported;Feet supported Sitting balance-Leahy Scale: Good     Standing balance support: Bilateral upper extremity supported;During functional activity Standing balance-Leahy Scale: Fair Standing balance comment: can stand statically without RW                            Cognition Arousal/Alertness: Awake/alert Behavior During Therapy: Impulsive Overall Cognitive Status: Within Functional Limits for tasks assessed                                        Exercises General Exercises - Lower Extremity Ankle Circles/Pumps: AROM;Both;20 reps;Supine Quad Sets: AROM;Both;Supine;10 reps Gluteal Sets: AROM;Both;10 reps;Supine Heel Slides: AROM;Both;10 reps;Supine Straight Leg Raises: AROM;Both;10 reps;Supine Hip Flexion/Marching: AROM;Both;10 reps;Supine    General Comments  Pertinent Vitals/Pain Pain Assessment: 0-10 Pain Score: 5  Pain Descriptors / Indicators: Aching;Grimacing;Guarding Pain Intervention(s): Monitored during session;Repositioned    Home Living                      Prior Function            PT Goals (current goals can now be found in the care plan section) Acute Rehab PT Goals Patient Stated Goal: to go home Potential to Achieve Goals: Good Progress towards PT goals: Progressing  toward goals    Frequency    Min 3X/week      PT Plan Current plan remains appropriate    Co-evaluation              AM-PAC PT "6 Clicks" Daily Activity  Outcome Measure  Difficulty turning over in bed (including adjusting bedclothes, sheets and blankets)?: None Difficulty moving from lying on back to sitting on the side of the bed? : None Difficulty sitting down on and standing up from a chair with arms (e.g., wheelchair, bedside commode, etc,.)?: A Little Help needed moving to and from a bed to chair (including a wheelchair)?: A Little Help needed walking in hospital room?: A Little Help needed climbing 3-5 steps with a railing? : A Little 6 Click Score: 20    End of Session Equipment Utilized During Treatment: Gait belt Activity Tolerance: Patient limited by fatigue;Patient limited by pain Patient left: in chair;with call bell/phone within reach;with family/visitor present Nurse Communication: Mobility status PT Visit Diagnosis: Muscle weakness (generalized) (M62.81);Pain Pain - Right/Left: Left Pain - part of body: Ankle and joints of foot     Time: 5790-3833 PT Time Calculation (min) (ACUTE ONLY): 37 min  Charges:  $Gait Training: 8-22 mins $Therapeutic Exercise: 8-22 mins                    G Codes:       Governor R[place]s, PTA pager [phone]    Cristela Blue 08/24/2016, 12:08 PM

## 2016-08-24 NOTE — Progress Notes (Signed)
Inpatient Diabetes Program Recommendations  AACE/ADA: New Consensus Statement on Inpatient Glycemic Control (2015)  Target Ranges:  Prepandial:   less than 140 mg/dL      Peak postprandial:   less than 180 mg/dL (1-2 hours)      Critically ill patients:  140 - 180 mg/dL   Results for CHURCHILL, GRIMSLEY (MRN [id number]) as of 08/24/2016 15:23  Ref. Range 08/23/2016 05:42 08/23/2016 17:31 08/23/2016 21:25  Glucose-Capillary Latest Ref Range: 65 - 99 mg/dL 197 (H) 118 (H) 145 (H)   Results for ABDALRAHMAN, CLEMENTSON (MRN [id number]) as of 08/24/2016 15:23  Ref. Range 08/24/2016 08:01 08/24/2016 11:39  Glucose-Capillary Latest Ref Range: 65 - 99 mg/dL 138 (H) 117 (H)    Admit with: Osteomyelitis of L Great Toe  History: DM2  Home DM Meds: 70/30 Insulin- 12 units AM/ 8 units PM (confirmed with patient at bedside)       Metformin 500 mg BID  Current Insulin Orders: Novolog Moderate Correction Scale/ SSI (0-15 units) TID AC + HS      Metformin 500 mg BID      MD- I spoke with patient today and confirmed that he was using 70/30 Insulin pens prior to hospitalization.  Note that discharge plans include Levemir and Humalog.    Note that pt's appetite has been poor at times and that he continues to have nausea and vomiting.  Do you want to send pt home on Levemir 30 units daily?  His fasting CBG this AM was 138 mg/dl and patient has not received any Levemir since his admission on 08/20/16.  Patient also stated he does not have a CBG meter at home.  Please provide patient with CBG meter Rx at time of discharge.     Spoke with patient today about his home diabetes care plan.  Patient stated he does not check his CBGs and does not have a CBG meter at home.  Has been using 70/30 insulin at home prior to admission and uses the insulin pens.  Verbally walked me through how to use insulin pens at home.  Reminded pt to always hold the needle in the skin for 10 seconds after injection to allow for all the  insulin to be absorbed into the skin.  Reviewed Levemir and Humalog insulins with patient and how they work, when to take.  RN to give specific instructions on how much insulin to take at time of d/c.  Reviewed proper storage of insulin and CBG goals.  Encouraged pt to check his CBGs TID before meals at home and to keep a record for the PCP.  Discussed with patient that he will be taken on as a new patient in the Internal Medicine clinic.  Discussed with patient that the providers in the clinic will help him get his medicines and assist with insulin titration.    --Will follow patient during hospitalization--  Wyn Quaker RN, MSN, CDE Diabetes Coordinator Inpatient Glycemic Control Team Team Pager: [phone] (8a-5p)

## 2016-08-24 NOTE — Care Management Note (Addendum)
Case Management Note  Patient Details  Name: [name] MRN: [id number] Date of Birth: [birth year]  Subjective/Objective:    45 yr old gentleman admitted with osteomyelitis of the left great toe. Patient s/p amputation of the left great toe on 08/20/16.                 Action/Plan: Case manager spoke with Patient concerning discharge needs and DME. Patient will be followed by [facility]. CM will try to get appointment scheduled prior to his discharge. CM will provide [place] letter, will fax scripts to [facility] outpatient pharmacy.   Expected Discharge Date:  08/23/16               Expected Discharge Plan:  Home/Self Care  In-House Referral:  NA  Discharge planning Services  CM Consult (CM will arrange appt at [facility] and contact patient)  Post Acute Care Choice:  Durable Medical Equipment Choice offered to:  Patient  DME Arranged:  Walker rolling DME Agency:  [place]:    [place]:  NA  Status of Service:  Completed, signed off  If discussed at [place] of Stay Meetings, dates discussed:    Additional Comments:  Ninfa Meeker, RN 08/24/2016, 11:36 AM

## 2016-08-24 NOTE — Progress Notes (Signed)
Orthopedic Tech Progress Note Patient Details:  [name] Mar 21, 1971 [id number]  Ortho Devices Type of Ortho Device: [place] shoe Ortho Device/Splint Location: LLE Ortho Device/Splint Interventions: Application   [name] 08/24/2016, 3:13 PM

## 2016-08-24 NOTE — Progress Notes (Signed)
Orthopedic Tech Progress Note Patient Details:  MINOR IDEN 1970/04/10 [id number]  Patient ID: [name], male   DOB: 1970-05-23, 46 y.o.   MRN: [id number]   [name] 08/24/2016, 1:21 PMCalled [employer] for left off loading shoe.

## 2016-08-24 NOTE — Progress Notes (Signed)
   Subjective: Prior to planned discharge yesterday afternoon he became nauseous and started vomiting small amount of clear liquid. He has also been anorexic and has not eaten since breakfast yesterday morning. Still complaining of pain to 7 out of 10 in intensity.  Does not feel comfortable with home today.  Objective:  Vital signs in last 24 hours: Vitals:   08/23/16 1737 08/23/16 1908 08/23/16 2100 08/24/16 0500  BP: (!) 170/90 (!) 169/95 (!) 153/98 (!) 141/82  Pulse: (!) 108 100 94 91  Resp: 20 20 18 20   Temp:  99.7 F (37.6 C) 98.4 F (36.9 C) 98.9 F (37.2 C)  TempSrc:  Oral Oral Oral  SpO2: 100% 100% 100% 99%  Weight:      Height:       Physical Exam  Constitutional: He is oriented to person, place, and time. He appears well-developed and well-nourished. No distress.  Cardiovascular: Normal rate and regular rhythm.   Pulmonary/Chest: Effort normal and breath sounds normal.  Musculoskeletal:  Left foot wrapped in clean bandage Mild-moderate circumferential tenderness to palpation to mid shin on left, minimal edema, no erythema or streaking  Neurological: He is alert and oriented to person, place, and time.  Psychiatric: He has a normal mood and affect. His behavior is normal.   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 male with diabetes and osteomyelitis of left great toe secondary to a diabetic foot ulcer. He is afebrile and hemodynamically stable with leukocytosis improving on antibiotics, now status post amputation of his great toe on 5/27.  Discharge is contingent on control of nausea and vomiting and ability to sustain himself orally.   #Nausea and Vomiting Likely multifactorial with contributions of pain, opiate therapy, constipation, and antibiotics. -Tylenol and  NSAID therapy, oxycodone as needed for uncontrolled pain -Antiemetics when necessary -Aggressive bowel regimen -No further antibiotics or antifungals -Will need stimulant laxative while on opiates  #Osteomyelitis of L Great Toe #Diabetic Toe Ulcer POD2 great toe amputation by Dr Sharol Given, recovering well -No further antibiotics -follow up w/ Dr Sharol Given in 1 week  #DM On Humalog 10U once daily with dinner and levemir 30U QHS. -SSI -resume home metformin 500 mg BID  #HTN -continue home lisinopril 10 mg daily and amlodipine 5 mg daily  #Onychomycosis -Discontinued terbinafine as possible contribution to nausea and vomiting  Fluids: none Diet: Carb DVT Prophylaxis: lovenox Code Status: full  Dispo: Anticipated discharge 0-1 days when tolerating by mouth and nausea and vomiting controlled.  Minus Liberty, MD 08/24/2016, 2:23 PM Pager: [phone]

## 2016-08-25 ENCOUNTER — Telehealth: Payer: Self-pay

## 2016-08-25 DIAGNOSIS — Z880 Allergy status to penicillin: Secondary | ICD-10-CM

## 2016-08-25 DIAGNOSIS — F17200 Nicotine dependence, unspecified, uncomplicated: Secondary | ICD-10-CM

## 2016-08-25 DIAGNOSIS — Z794 Long term (current) use of insulin: Secondary | ICD-10-CM

## 2016-08-25 DIAGNOSIS — L97529 Non-pressure chronic ulcer of other part of left foot with unspecified severity: Secondary | ICD-10-CM

## 2016-08-25 DIAGNOSIS — E1169 Type 2 diabetes mellitus with other specified complication: Principal | ICD-10-CM

## 2016-08-25 DIAGNOSIS — Z91030 Bee allergy status: Secondary | ICD-10-CM

## 2016-08-25 DIAGNOSIS — B9689 Other specified bacterial agents as the cause of diseases classified elsewhere: Secondary | ICD-10-CM

## 2016-08-25 DIAGNOSIS — Z89412 Acquired absence of left great toe: Secondary | ICD-10-CM

## 2016-08-25 DIAGNOSIS — Z7982 Long term (current) use of aspirin: Secondary | ICD-10-CM

## 2016-08-25 DIAGNOSIS — Z79899 Other long term (current) drug therapy: Secondary | ICD-10-CM

## 2016-08-25 LAB — GLUCOSE, CAPILLARY
GLUCOSE-CAPILLARY: 98 mg/dL (ref 65–99)
Glucose-Capillary: 100 mg/dL — ABNORMAL HIGH (ref 65–99)

## 2016-08-25 LAB — CULTURE, BLOOD (ROUTINE X 2)
CULTURE: NO GROWTH
CULTURE: NO GROWTH
SPECIAL REQUESTS: ADEQUATE
Special Requests: ADEQUATE

## 2016-08-25 MED ORDER — ONDANSETRON HCL 4 MG PO TABS: 4.0000 mg | ORAL_TABLET | Freq: Four times a day (QID) | ORAL | 0 refills | Status: DC | PRN

## 2016-08-25 MED ORDER — BLOOD GLUCOSE MONITOR KIT: PACK | 0 refills | Status: AC

## 2016-08-25 MED ORDER — INSULIN DETEMIR 100 UNIT/ML ~~LOC~~ SOLN: 10.0000 [IU] | Freq: Every day | SUBCUTANEOUS | 0 refills | Status: DC

## 2016-08-25 MED FILL — ONDANSETRON HCL 4 MG TABLET: 4 | 5 days supply | Qty: 20 | Fill #0

## 2016-08-25 MED FILL — metFORMIN HCL 500 MG TABS: 500 | 30 days supply | Qty: 60 | Fill #0

## 2016-08-25 MED FILL — LEVEMIR 100 UNITS/ML VIAL: 100 | 33 days supply | Qty: 10 | Fill #0

## 2016-08-25 MED FILL — oxyCODONE HCL 5 MG TABS: 5 | 3 days supply | Qty: 30 | Fill #0

## 2016-08-25 NOTE — Telephone Encounter (Signed)
Hospital TOC per Dr Inda Castle, discharge 08/25/2016, appt 09/01/2016 @ 10:15am.

## 2016-08-25 NOTE — Progress Notes (Signed)
Physical Therapy Treatment Patient Details Name: [name] MRN: [id number] DOB: 22-Jul-1970 Today's Date: 08/25/2016    History of Present Illness [name] is a  46 y.o. male with a diagnosis of osteomyelitis who failed conservative measures and elected for surgical management.  Left great toe amputation.     PT Comments    Pt performed increased gait during session.  PTA educated on fit and placement of darco shoe.  MD changed weight bearing status with darco shoe.  Pt reviewed exercises with PTA and educated on technique and frequency.  Plan to d/c home today.    Follow Up Recommendations  No PT follow up;Supervision - Intermittent     Equipment Recommendations  Rolling walker with 5" wheels    Recommendations for Other Services       Precautions / Restrictions Precautions Precautions: Fall Required Braces or Orthoses: Other Brace/Splint Other Brace/Splint: Darco Shoe ( off loading ) Restrictions Weight Bearing Restrictions: Yes LLE Weight Bearing: Weight bearing as tolerated Other Position/Activity Restrictions: Pt with better carryover with darco off loading boot.      Mobility  Bed Mobility Overal bed mobility: Independent                Transfers Overall transfer level: Needs assistance Equipment used: Rolling walker (2 wheeled) Transfers: Sit to/from Stand Sit to Stand: Supervision         General transfer comment: Pt required cues for hand placement to and from seated surface, pt reaching for RW to pull into standing.    Ambulation/Gait Ambulation/Gait assistance: Supervision Ambulation Distance (Feet): 250 Feet Assistive device: Rolling walker (2 wheeled) Gait Pattern/deviations: Step-to pattern;Narrow base of support     General Gait Details: Cues for sequencing.  pt able to advance distance with darco shoe.  Pt tolerated gait well.  Cues for RW position and safety.     Stairs            Wheelchair Mobility    Modified  Rankin (Stroke Patients Only)       Balance Overall balance assessment: Needs assistance Sitting-balance support: No upper extremity supported;Feet supported Sitting balance-Leahy Scale: Good     Standing balance support: Bilateral upper extremity supported;During functional activity Standing balance-Leahy Scale: Fair Standing balance comment: can stand statically without RW                            Cognition Arousal/Alertness: Awake/alert Behavior During Therapy: Impulsive Overall Cognitive Status: Within Functional Limits for tasks assessed                                        Exercises General Exercises - Lower Extremity Ankle Circles/Pumps: AROM;Both;20 reps;Supine Quad Sets: AROM;Both;Supine;10 reps Gluteal Sets: AROM;Both;10 reps;Supine Heel Slides: AROM;Both;10 reps;Supine Hip ABduction/ADduction: AROM;Both;10 reps;Supine Straight Leg Raises: AROM;Both;10 reps;Supine    General Comments        Pertinent Vitals/Pain Pain Assessment: 0-10 Pain Score: 4  Pain Location: left toe Pain Descriptors / Indicators: Discomfort Pain Intervention(s): Monitored during session;Repositioned    Home Living                      Prior Function            PT Goals (current goals can now be found in the care plan section) Acute Rehab PT Goals Patient Stated  Goal: to go home Potential to Achieve Goals: Good Progress towards PT goals: Progressing toward goals    Frequency    Min 3X/week      PT Plan Current plan remains appropriate    Co-evaluation              AM-PAC PT "6 Clicks" Daily Activity  Outcome Measure  Difficulty turning over in bed (including adjusting bedclothes, sheets and blankets)?: None Difficulty moving from lying on back to sitting on the side of the bed? : A Little Difficulty sitting down on and standing up from a chair with arms (e.g., wheelchair, bedside commode, etc,.)?: A Little Help needed  moving to and from a bed to chair (including a wheelchair)?: A Little Help needed walking in hospital room?: A Little Help needed climbing 3-5 steps with a railing? : A Little 6 Click Score: 19    End of Session Equipment Utilized During Treatment: Gait belt Activity Tolerance: Patient limited by fatigue;Patient limited by pain Patient left: with call bell/phone within reach;with family/visitor present;in bed Nurse Communication: Mobility status PT Visit Diagnosis: Muscle weakness (generalized) (M62.81);Pain Pain - Right/Left: Left Pain - part of body: Ankle and joints of foot     Time: 4327-6147 PT Time Calculation (min) (ACUTE ONLY): 21 min  Charges:  $Gait Training: 8-22 mins                    G Codes:       Governor Rooks, PTA pager [place] 08/25/2016, 1:57 PM

## 2016-08-25 NOTE — Progress Notes (Signed)
   Subjective: Has noted some mild discomfort with swallowing since yesterday, was able to eat breakfast and dinner and drink plenty of water without difficulties. Feels ready for discharge.  Objective:  Vital signs in last 24 hours: Vitals:   08/23/16 2100 08/24/16 0500 08/24/16 2116 08/25/16 0446  BP: (!) 153/98 (!) 141/82 140/77 (!) 146/89  Pulse: 94 91 90 89  Resp: 18 20 20 20   Temp: 98.4 F (36.9 C) 98.9 F (37.2 C) 98.6 F (37 C) 98.1 F (36.7 C)  TempSrc: Oral Oral Oral Oral  SpO2: 100% 99% 100% 100%  Weight:      Height:       Physical Exam  Constitutional: He is oriented to person, place, and time. He appears well-developed and well-nourished. No distress.  Cardiovascular: Normal rate and regular rhythm.   Pulmonary/Chest: Effort normal and breath sounds normal.  Musculoskeletal:  Left foot wrapped in clean bandage Mild-moderate circumferential tenderness to palpation to mid shin on left, minimal edema, no erythema or streaking  Neurological: He is alert and oriented to person, place, and time.  Psychiatric: He has a normal mood and affect. His behavior is normal.   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 male with diabetes and osteomyelitis of left great toe secondary to a diabetic foot ulcer. He is afebrile and hemodynamically stable status post amputation of his great toe on 5/27.  He is now eating and drinking well and able to ambulate with walker and ready for discharge.  #Nausea and Vomiting #Constipation Resolved.  Likely multifactorial with contributions of pain, opiate therapy, constipation, and antibiotics. -Tylenol and NSAID therapy, oxycodone as needed for uncontrolled pain -Antiemetics when necessary -No further antibiotics or antifungals -Continue  stimulant laxative while on opiates  #Osteomyelitis of L Great Toe #Diabetic Toe Ulcer Great toe amputation by Dr Sharol Given on 5/27 recovering well. -No further antibiotics -follow up w/ Dr Sharol Given in 1 week  #DM -SSI -resume home metformin 500 mg BID  #HTN -continue home lisinopril 10 mg daily and amlodipine 5 mg daily  #Onychomycosis -Discontinued terbinafine as possible contribution to nausea and vomiting  Fluids: none Diet: Carb DVT Prophylaxis: lovenox Code Status: full  Dispo: Anticipated discharge today.  Minus Liberty, MD 08/25/2016, 6:50 AM Pager: [phone]

## 2016-08-26 ENCOUNTER — Ambulatory Visit: Payer: Self-pay | Attending: Internal Medicine | Admitting: Physician Assistant

## 2016-08-26 ENCOUNTER — Encounter: Payer: Self-pay | Admitting: Physician Assistant

## 2016-08-26 VITALS — BP 166/104 | HR 100 | Temp 98.6°F | Resp 16 | Wt 185.4 lb

## 2016-08-26 DIAGNOSIS — E1165 Type 2 diabetes mellitus with hyperglycemia: Secondary | ICD-10-CM

## 2016-08-26 DIAGNOSIS — Z7982 Long term (current) use of aspirin: Secondary | ICD-10-CM | POA: Insufficient documentation

## 2016-08-26 DIAGNOSIS — Z794 Long term (current) use of insulin: Secondary | ICD-10-CM | POA: Insufficient documentation

## 2016-08-26 DIAGNOSIS — I10 Essential (primary) hypertension: Secondary | ICD-10-CM | POA: Insufficient documentation

## 2016-08-26 DIAGNOSIS — Z8739 Personal history of other diseases of the musculoskeletal system and connective tissue: Secondary | ICD-10-CM | POA: Insufficient documentation

## 2016-08-26 DIAGNOSIS — Z880 Allergy status to penicillin: Secondary | ICD-10-CM | POA: Insufficient documentation

## 2016-08-26 DIAGNOSIS — E1149 Type 2 diabetes mellitus with other diabetic neurological complication: Secondary | ICD-10-CM | POA: Insufficient documentation

## 2016-08-26 DIAGNOSIS — S98112A Complete traumatic amputation of left great toe, initial encounter: Secondary | ICD-10-CM

## 2016-08-26 DIAGNOSIS — Z9114 Patient's other noncompliance with medication regimen: Secondary | ICD-10-CM | POA: Insufficient documentation

## 2016-08-26 DIAGNOSIS — Z89412 Acquired absence of left great toe: Secondary | ICD-10-CM | POA: Insufficient documentation

## 2016-08-26 DIAGNOSIS — Z87891 Personal history of nicotine dependence: Secondary | ICD-10-CM | POA: Insufficient documentation

## 2016-08-26 DIAGNOSIS — E785 Hyperlipidemia, unspecified: Secondary | ICD-10-CM | POA: Insufficient documentation

## 2016-08-26 DIAGNOSIS — IMO0002 Reserved for concepts with insufficient information to code with codable children: Secondary | ICD-10-CM

## 2016-08-26 DIAGNOSIS — F17200 Nicotine dependence, unspecified, uncomplicated: Secondary | ICD-10-CM

## 2016-08-26 DIAGNOSIS — Z79899 Other long term (current) drug therapy: Secondary | ICD-10-CM | POA: Insufficient documentation

## 2016-08-26 LAB — GLUCOSE, POCT (MANUAL RESULT ENTRY): POC Glucose: 231 mg/dl — AB (ref 70–99)

## 2016-08-26 NOTE — Progress Notes (Signed)
Patient ID: [name], male   DOB: Nov 26, 1970, 46 y.o.   MRN: [id number]   [name], is a 46 y.o. male  BJS:[id number]  YWV:[id number]  DOB - [birth year]  Subjective:  Chief Complaint and HPI: [name] is a 46 y.o. 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 Osteomyelitis of Left Great Toe [name] is a 46 year old man with history of poorly controlled diabetes who was admitted with worsening pain and purulent discharge from his left great toe, which has had diabetic ulcer for past several months. He is afebrile and hemodynamically stable had leukocytosis to 15. MRI of the left foot suggested osteomyelitis of the distal phalanx of left great toe. Orthopedic surgery was consulted, and the great toe was amputated by Dr. Sharol Given on 8/54 complication. His discharge was delayed by nausea, vomiting, and constipation which resolved after escalating bowel regimen and having bowel movements. He was eating and drinking well, pain improved, and able to bear weight and ambulate with walker prior to discharge. He will follow-up with and establish care at the [facility] and follow-up with his orthopedic surgeon next week.  2. DM Controlled diabetes with A1c 9.8% at this admission. He has had difficulties affording diabetes medicines, most recently on 70/30 insulin and metformin. He was maintained on sliding scale corrective insulin and started on 10 units of Levemir as an inpatient, and discharged with prescriptions for Levemir, Humalog once daily, and metformin.  He will need outpatient follow-up to continue titrating insulin.  3. Tobacco Abuse Counseled on importance of tobacco cessation for wound  healing. Motivated to quit smoking. Prescribed nicotine patches on discharge. Marland Kitchen   ED/Hospital notes reviewed.    Today he presents able to ambulate without his walker.  Pain is moderate.  Ortho f/up is on June 4th.  He is doing well.  No f/c.  He has not taken any of his meds today but he did get his levemir and BP meds filled.  He has a glucometer.  No new complaints or problems.  Social history:  Working at [employer], +smoker-quit 1 week ago ROS:   Constitutional:  No f/c, No night sweats, No unexplained weight loss. EENT:  No vision changes, No blurry vision, No hearing changes. No mouth, throat, or ear problems.  Respiratory: No cough, No SOB Cardiac: No CP, no palpitations GI:  No abd pain, No N/V/D. GU: No Urinary s/sx Musculoskeletal: No joint pain Neuro: No headache, no dizziness, no motor weakness.  Skin: No rash Endocrine:  No polydipsia. No polyuria.  Psych: Denies SI/HI  No problems updated.  ALLERGIES: Allergies  Allergen Reactions  . Bee Venom Swelling  . Penicillins Hives    Has patient had a PCN reaction causing immediate rash, facial/tongue/throat swelling, SOB or lightheadedness with hypotension: Yes Has patient had a PCN reaction causing severe rash involving mucus membranes or skin necrosis: No Has patient had a PCN reaction that required hospitalization: No Has patient had a PCN reaction occurring within the last 10 years: No If all of the above answers are "NO", then may proceed with Cephalosporin use.     PAST MEDICAL HISTORY: Past Medical History:  Diagnosis Date  . Diabetes mellitus type II, uncontrolled ([place])   . Hyperlipidemia   .  Hypertension   . Other and unspecified angina pectoris   . Tobacco use     MEDICATIONS AT HOME: Prior to Admission medications   Medication Sig Start Date End Date Taking? Authorizing Provider  acetaminophen (TYLENOL) 325 MG tablet Take 2 tablets (650 mg total) by mouth every 6 (six) hours as needed for mild pain (or  Fever >/= 101). 08/23/16 09/22/16 Yes Minus Liberty, MD  amLODipine (NORVASC) 5 MG tablet Take 1 tablet (5 mg total) by mouth daily. 08/23/16 11/21/16 Yes Minus Liberty, MD  aspirin EC 81 MG tablet Take 81 mg by mouth daily. 02/11/16  Yes [provider]  atorvastatin (LIPITOR) 20 MG tablet Take 1 tablet (20 mg total) by mouth daily. 08/23/16 11/21/16 Yes Minus Liberty, MD  blood glucose meter kit and supplies KIT Dispense based on patient and insurance preference. Use up to four times daily as directed. (FOR ICD-9 250.00, 250.01). 08/25/16  Yes Minus Liberty, MD  Cyanocobalamin (VITAMIN B-12 PO) Take 1 capsule by mouth daily.   Yes [provider]  gabapentin (NEURONTIN) 300 MG capsule Take 1 capsule (300 mg total) by mouth 3 (three) times daily. 08/23/16 11/21/16 Yes Minus Liberty, MD  insulin detemir (LEVEMIR) 100 UNIT/ML injection Inject 0.1 mLs (10 Units total) into the skin at bedtime. 08/25/16 11/23/16 Yes Minus Liberty, MD  insulin lispro (HUMALOG) 100 UNIT/ML injection Inject 0.1 mLs (10 Units total) into the skin daily with supper. 08/24/16 11/22/16 Yes Minus Liberty, MD  Insulin Syringe-Needle U-100 (INSULIN SYRINGE .5CC/31GX5/16") 31G X 5/16" 0.5 ML MISC 1 each by Does not apply route as needed. 08/24/16  Yes Minus Liberty, MD  lisinopril (PRINIVIL,ZESTRIL) 20 MG tablet Take 1 tablet (20 mg total) by mouth daily. 08/23/16 11/21/16 Yes Minus Liberty, MD  metFORMIN (GLUCOPHAGE) 500 MG tablet Take 1 tablet (500 mg total) by mouth 2 (two) times daily with a meal. 08/23/16 11/21/16 Yes Minus Liberty, MD  metoprolol tartrate (LOPRESSOR) 25 MG tablet Take 1 tablet (25 mg total) by mouth 2 (two) times daily. 08/23/16 11/21/16 Yes Minus Liberty, MD  nitroGLYCERIN (NITROSTAT) 0.4 MG SL tablet Place 1 tablet (0.4 mg total) under the tongue every 5 (five) minutes as needed for chest pain. 07/26/13  Yes Jettie Booze, MD  omeprazole  (PRILOSEC) 20 MG capsule Take 1 capsule (20 mg total) by mouth daily. 12/02/13  Yes Malvin Johns, MD  ondansetron (ZOFRAN) 4 MG tablet Take 1 tablet (4 mg total) by mouth every 6 (six) hours as needed for nausea. 08/25/16  Yes Minus Liberty, MD  oxyCODONE (OXY IR/ROXICODONE) 5 MG immediate release tablet Take 1-2 tablets (5-10 mg total) by mouth every 4 (four) hours as needed for breakthrough pain. 08/23/16 08/30/16 Yes Lorella Nimrod, MD  senna-docusate (SENOKOT-S) 8.6-50 MG tablet While taking oxycodone, take 1-2 pills twice daily as needed to have a bowel movement daily. 08/23/16  Yes Minus Liberty, MD  sildenafil (REVATIO) 20 MG tablet Take 20 mg by mouth daily as needed for erectile dysfunction. 07/07/15  Yes [provider]  nicotine (NICODERM CQ - DOSED IN MG/24 HOURS) 21 mg/24hr patch Place 1 patch (21 mg total) onto the skin daily. Patient not taking: Reported on 08/26/2016 08/23/16   Minus Liberty, MD     Objective:  EXAM:   Vitals:   08/26/16 1626  BP: (!) 166/104  Pulse: 100  Resp: 16  Temp: 98.6 F (37 C)  TempSrc: Oral  SpO2: 100%  Weight: 185 lb 6.4 oz (84.1 kg)  General appearance : A&OX3. NAD. Non-toxic-appearing, ambulating without assistance.  HEENT: Atraumatic and Normocephalic.  PERRLA. EOM intact.  Neck: supple, no JVD. No cervical lymphadenopathy. No thyromegaly Chest/Lungs:  Breathing-non-labored, Good air entry bilaterally, breath sounds normal without rales, rhonchi, or wheezing  CVS: S1 S2 regular, no murmurs, gallops, rubs  Extremities: Bilateral Lower Ext shows no edema, both legs are warm to touch with = pulse throughout.  l leg/ankle without swelling.  Dressing not removed due to orthopedist instructions of not unwrapping unless their was a problem.   Neurology:  CN II-XII grossly intact, Non focal.   Psych:  TP linear. J/I WNL. Normal speech. Appropriate eye contact and affect.  Skin:  No Rash  Data Review Lab Results  Component  Value Date   HGBA1C 9.8 (H) 08/20/2016   HGBA1C 7.0 (H) 11/28/2012   HGBA1C 10.6 (H) 08/04/2012     Assessment & Plan   1. Hypertension, unspecified type Uncontrolled-hasn't taken meds today-h/o non-compliance Check blood pressures 3 times weekly and record and bring to next visit Take meds as directed We have discussed target BP range and blood pressure goal. I have advised patient to check BP regularly and to call us back or report to clinic if the numbers are consistently higher than 140/90. We discussed the importance of compliance with medical therapy and DASH diet recommended, consequences of uncontrolled hypertension discussed.   2. Diabetes mellitus with neurological manifestations, uncontrolled ([place]) Uncontrolled - POCT glucose (manual entry) Check blood sugars fasting and at bedtime Take all meds as directed.  3. Lower limb amputation, great toe, left ([place]) Doing well.  No sign systemic infection.  Continue to elevate daily to reduce swelling and pain  4. Smoking-1-800-quitnow.  Advised to pick up patches.  He hasn't smoked in 1 week! Congratulations!  Patient have been counseled extensively about nutrition and exercise  Return in about 3 weeks (around 09/16/2016) for assign PCP; f/up htn and DM.  The patient was given clear instructions to go to ER or return to medical center if symptoms don't improve, worsen or new problems develop. The patient verbalized understanding. The patient was told to call to get lab results if they haven't heard anything in the next week.   Freeman Caldron, PA-C [facility] and [place], [place]   08/26/2016, 4:59 PM

## 2016-08-26 NOTE — Patient Instructions (Addendum)
1-800-quitnow  Check blood sugars fasting and at bedtime  Check blood pressures 3 times weekly and record and bring to next visit

## 2016-08-30 ENCOUNTER — Ambulatory Visit (INDEPENDENT_AMBULATORY_CARE_PROVIDER_SITE_OTHER): Payer: Self-pay | Admitting: Family

## 2016-08-30 ENCOUNTER — Encounter (INDEPENDENT_AMBULATORY_CARE_PROVIDER_SITE_OTHER): Payer: Self-pay | Admitting: Family

## 2016-08-30 VITALS — Ht 65.0 in | Wt 185.0 lb

## 2016-08-30 DIAGNOSIS — IMO0002 Reserved for concepts with insufficient information to code with codable children: Secondary | ICD-10-CM

## 2016-08-30 DIAGNOSIS — Z89412 Acquired absence of left great toe: Secondary | ICD-10-CM

## 2016-08-30 MED ORDER — NICOTINE 21 MG/24HR TD PT24: 21.0000 mg | MEDICATED_PATCH | Freq: Every day | TRANSDERMAL | 0 refills | Status: DC

## 2016-08-30 NOTE — Progress Notes (Signed)
   Post-Op Visit Note   Patient: [name]           Date of Birth: 1970-07-04           MRN: [id number] Visit Date: 08/30/2016 PCP: Rosine Abe, DO  Chief Complaint:  Chief Complaint  Patient presents with  . Left Foot - Routine Post Op    08/22/16 left great toe amputation     HPI:  The patient is 46 year old gentleman who is 1 week status post left great toe amputation. The patient has been full weightbearing in a postop shoe. Complains of little drainage wonders when he can return to work.    Ortho Exam Incision well proximally sutures there is no drainage no odor no gaping no erythema no sign of infection  Visit Diagnoses:  1. Great toe amputation status, left [name])     Plan: Have provided a note to continue out of work. He'll minimize his weightbearing. Begin cleansing the foot daily with dial soap. Apply dry dressing and elevate for swelling. Follow-up in office in one more week for suture removal.  Follow-Up Instructions: Return in about 1 week (around 09/06/2016).   Imaging: No results found.  Orders:  No orders of the defined types were placed in this encounter.  Meds ordered this encounter  Medications  . nicotine (NICODERM CQ - DOSED IN MG/24 HOURS) 21 mg/24hr patch    Sig: Place 1 patch (21 mg total) onto the skin daily.    Dispense:  28 patch    Refill:  0     PMFS History: Patient Active Problem List   Diagnosis Date Noted  . Chronic osteomyelitis of left foot with draining sinus ([name])   . 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 Coronary atherosclerosis of native coronary artery 08/17/2013  . Other and unspecified angina pectoris   . Chest pain 07/20/2013  . Diabetic retinopathy ([place]) 02/02/2013  . Stress reaction 12/01/2012  . Diabetic neuropathy ([place]) 12/01/2012  . Hypertension 08/03/2012  . Tobacco use disorder 08/03/2012  . Erectile dysfunction 08/03/2012  . Migraine 08/03/2012  . Diabetes mellitus with neurological  manifestations, uncontrolled ([place]) 05/26/2006   Past Medical History:  Diagnosis Date  . Diabetes mellitus type II, uncontrolled ([place])   . Hyperlipidemia   . Hypertension   . Other and unspecified angina pectoris   . Tobacco use     Family History  Problem Relation Age of Onset  . Cancer Mother   . Diabetes Father   . Hypertension Father     Past Surgical History:  Procedure Laterality Date  . AMPUTATION Left 08/22/2016   Procedure: GREAT TOE AMPUTATION;  Surgeon: [name], MD;  Location: [place];  Service: Orthopedics;  Laterality: Left;  . LEFT HEART CATHETERIZATION WITH CORONARY ANGIOGRAM N/A 07/31/2013   Procedure: LEFT HEART CATHETERIZATION WITH CORONARY ANGIOGRAM;  Surgeon: Jettie Booze, MD;  [facility] LAB;  Service: Cardiovascular;  Laterality: N/A;  . spinal tap     Social History   Occupational History  . 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 Smoking status: Current Every Day Smoker    Packs/day: 1.00    Years: 20.00    Types: Cigarettes  . Smokeless tobacco: Never Used  . Alcohol use No     Comment: occ  . Drug use: No  . Sexual activity: Not Currently

## 2016-09-01 ENCOUNTER — Ambulatory Visit (INDEPENDENT_AMBULATORY_CARE_PROVIDER_SITE_OTHER): Payer: Self-pay | Admitting: Internal Medicine

## 2016-09-01 ENCOUNTER — Ambulatory Visit: Payer: Self-pay | Admitting: Pharmacist

## 2016-09-01 VITALS — BP 168/91 | HR 97 | Temp 98.7°F | Ht 65.0 in | Wt 185.1 lb

## 2016-09-01 DIAGNOSIS — Z79899 Other long term (current) drug therapy: Secondary | ICD-10-CM

## 2016-09-01 DIAGNOSIS — E1149 Type 2 diabetes mellitus with other diabetic neurological complication: Secondary | ICD-10-CM

## 2016-09-01 DIAGNOSIS — Z8249 Family history of ischemic heart disease and other diseases of the circulatory system: Secondary | ICD-10-CM

## 2016-09-01 DIAGNOSIS — F17200 Nicotine dependence, unspecified, uncomplicated: Secondary | ICD-10-CM

## 2016-09-01 DIAGNOSIS — Z809 Family history of malignant neoplasm, unspecified: Secondary | ICD-10-CM

## 2016-09-01 DIAGNOSIS — E1165 Type 2 diabetes mellitus with hyperglycemia: Principal | ICD-10-CM

## 2016-09-01 DIAGNOSIS — Z7982 Long term (current) use of aspirin: Secondary | ICD-10-CM

## 2016-09-01 DIAGNOSIS — Z8739 Personal history of other diseases of the musculoskeletal system and connective tissue: Secondary | ICD-10-CM

## 2016-09-01 DIAGNOSIS — Z89412 Acquired absence of left great toe: Secondary | ICD-10-CM

## 2016-09-01 DIAGNOSIS — IMO0002 Reserved for concepts with insufficient information to code with codable children: Secondary | ICD-10-CM

## 2016-09-01 DIAGNOSIS — F329 Major depressive disorder, single episode, unspecified: Secondary | ICD-10-CM | POA: Insufficient documentation

## 2016-09-01 DIAGNOSIS — F32A Depression, unspecified: Secondary | ICD-10-CM | POA: Insufficient documentation

## 2016-09-01 DIAGNOSIS — L97509 Non-pressure chronic ulcer of other part of unspecified foot with unspecified severity: Secondary | ICD-10-CM

## 2016-09-01 DIAGNOSIS — Z833 Family history of diabetes mellitus: Secondary | ICD-10-CM

## 2016-09-01 DIAGNOSIS — F17210 Nicotine dependence, cigarettes, uncomplicated: Secondary | ICD-10-CM

## 2016-09-01 DIAGNOSIS — E13621 Other specified diabetes mellitus with foot ulcer: Secondary | ICD-10-CM

## 2016-09-01 DIAGNOSIS — Z794 Long term (current) use of insulin: Secondary | ICD-10-CM

## 2016-09-01 DIAGNOSIS — F321 Major depressive disorder, single episode, moderate: Secondary | ICD-10-CM

## 2016-09-01 DIAGNOSIS — I10 Essential (primary) hypertension: Secondary | ICD-10-CM

## 2016-09-01 DIAGNOSIS — I25119 Atherosclerotic heart disease of native coronary artery with unspecified angina pectoris: Secondary | ICD-10-CM

## 2016-09-01 DIAGNOSIS — I2510 Atherosclerotic heart disease of native coronary artery without angina pectoris: Secondary | ICD-10-CM

## 2016-09-01 LAB — GLUCOSE, CAPILLARY: Glucose-Capillary: 347 mg/dL — ABNORMAL HIGH (ref 65–99)

## 2016-09-01 MED ORDER — CITALOPRAM HYDROBROMIDE 20 MG PO TABS: 20.0000 mg | ORAL_TABLET | Freq: Every day | ORAL | 0 refills | Status: DC

## 2016-09-01 MED ORDER — AMLODIPINE BESYLATE 10 MG PO TABS: 10.0000 mg | ORAL_TABLET | Freq: Every day | ORAL | 0 refills | Status: DC

## 2016-09-01 MED ORDER — METFORMIN HCL 1000 MG PO TABS: 500.0000 mg | ORAL_TABLET | Freq: Two times a day (BID) | ORAL | 0 refills | Status: DC

## 2016-09-01 NOTE — Assessment & Plan Note (Addendum)
Lab Results  Component Value Date   HGBA1C 9.8 (H) 08/20/2016   HGBA1C 7.0 (H) 11/28/2012   HGBA1C 10.6 (H) 08/04/2012    Recent Labs  09/01/16 1039  GLUCAP 347*    Current medications: Metformin 1000 mg daily Current insulin: Levemir 10 units daily at bedtime, Humalog 10 units with largest meal  He has not been checking blood sugars at home because he has been too distracted by his foot after the amputation as well as the stressful living situation with his father.  Assessment HgbA1c goal: <7.0 Glycemic control: uncontrolled Complications: neuropathy  Plan Medications: Increase metformin to 2000 mg daily Insulin: Continue current insulin Other: -Emphasized the importance of checking blood sugars at home for glycemic control, wound healing, and to prevent further amputations -Refer to diabetes educator -Follow-up in 2 weeks

## 2016-09-01 NOTE — Assessment & Plan Note (Addendum)
BP Readings from Last 3 Encounters:  09/01/16 (!) 168/91  08/26/16 (!) 166/104  08/25/16 (!) 146/89   Lab Results  Component Value Date   CREATININE 1.00 08/24/2016   Lab Results  Component Value Date   K 4.0 08/24/2016   Current medications: amlodipine 5 mg daily, Lisinopril 20 mg daily, metoprolol 25 mg twice a day  Reports that he did not take medicines this morning because he was in a hurry.  Assessment BP goal: <130/80 BP control: uncontrolled  Plan Medications: Increase amlodipine to 10 mg daily Other: -Sized importance of medication adherence -Check BMP today -Follow-up in 2 weeks -Anticipate need to further uptitrate lisinopril and/or add additional agent

## 2016-09-01 NOTE — Assessment & Plan Note (Signed)
Currently smoking 1 pack per day. Still wants to quit smoking, and was encouraged by his ability to go without smoking or was in the hospital.   -Emphasized importance of tobacco cessation for wound healing -Provided samples of 21 mg nicotine patches -Given information 1 800 quit now and encouraged to call

## 2016-09-01 NOTE — Assessment & Plan Note (Addendum)
Previously [place] patient.  Coronary angiography in 2015 showed mild nonobstructive CAD in proximal LAD.  A/P History of nonobstructive CAD with no anginal symptoms.  -Continue atorvastatin 20 mg daily, metoprolol 25 mg twice a day, aspirin 81 mg daily -Check lipids

## 2016-09-01 NOTE — Assessment & Plan Note (Addendum)
  Depression screen [facility] 2/9 09/01/2016 08/26/2016  Decreased Interest 1 1  Down, Depressed, Hopeless 1 2  PHQ - 2 Score 2 3  Altered sleeping 1 2  Tired, decreased energy 1 2  Change in appetite 3 2  Feeling bad or failure about yourself  1 1  Trouble concentrating 3 1  Moving slowly or fidgety/restless 3 0  Suicidal thoughts 0 0  PHQ-9 Score 14 11  Difficult doing work/chores Very difficult -   Reports symptoms of sadness, hopelessness, frustration, anger which have progressively worsened over the last 10 years. Symptoms are exacerbated by poor social situation in which he is living with his father and feeling lack of independence and interpersonal conflict.  Denies any intent to harm himself, but said that if he were to die he would be okay with it because he doesn't report much in the future. Says that every day his mood makes it hard for him to function.  Current medications: none  Previous medications: none  Assessment Active major depression with moderate severity symptoms.  Plan  Medications: Start citalopram 20 mg daily  Other:  -Provided information [facility] in case of urgent need -Follow-up in 2 weeks

## 2016-09-01 NOTE — Progress Notes (Addendum)
   CC: "I'm here to get my diabetes under control."  HPI:  JosephJoseph [name] is a 46 y.o. man with history of DM, HTN, and non-obstructive CAD who presents for further management of diabetes.  Please see A&P for status of the patient's chronic medical conditions.  He was recently admitted from May 25-30 with osteomyelitis of his left great toe underwent amputation. He recently moved back to [place] and unable to afford medicines.  Had hospital follow-up appointment [facility] last week, but wishes to establish care at the [facility].  Past Medical History:  Diagnosis Date  . Diabetes mellitus type II, uncontrolled ([place])   . Hyperlipidemia   . Hypertension   . Other and unspecified angina pectoris   . Tobacco use    Family History  Problem Relation Age of Onset  . Cancer Mother   . Diabetes Father   . Hypertension Father    Social History   Social History  . Marital status: Single    Spouse name: N/A  . Number of children: N/A  . 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 Smoking status: Current Every Day Smoker    Packs/day: 1.00    Years: 20.00    Types: Cigarettes  . Smokeless tobacco: Never Used  . Alcohol use No     Comment: occ  . Drug use: No  . Sexual activity: Not Currently   Other Topics Concern  . Not on file   Social History Narrative   Works in [employer], recently moved in early 2018 from [place]. Previous saw free clinic providers.     Review of Systems:    Review of Systems  Constitutional: Negative for chills and fever.  Respiratory: Negative for cough, shortness of breath and wheezing.   Cardiovascular: Negative for chest pain and palpitations.  Gastrointestinal: Negative for abdominal pain, constipation, diarrhea, nausea and vomiting.  Genitourinary: Negative for dysuria and hematuria.  Psychiatric/Behavioral: Positive for  depression. Negative for suicidal ideas.    Physical Exam:  Vitals:   09/01/16 1004  BP: (!) 172/89  Pulse: (!) 107  Temp: 98.7 F (37.1 C)  TempSrc: Oral  SpO2: 100%  Weight: 185 lb 1.6 oz (84 kg)  Height: 5\' 5"  (1.651 m)     Physical Exam  Constitutional: He is oriented to person, place, and time. He appears well-developed and well-nourished. No distress.  Cardiovascular: Normal rate and regular rhythm.   Pulmonary/Chest: Effort normal and breath sounds normal.  Musculoskeletal:  Left great toe amputation site well approximated, sutures in place, no drainage, erythema, tenderness Mild tenderness to palpation of left lower leg with no edema  Neurological: He is alert and oriented to person, place, and time.  Skin: Skin is warm and dry. No erythema.  Psychiatric:  Mood depressed Affect normal and reactive       Assessment & Plan:   See Encounters Tab for problem based charting.  Patient discussed with Dr. Dareen Piano

## 2016-09-01 NOTE — Assessment & Plan Note (Addendum)
He was seen by orthopedics 2 days ago for 1 week post-op follow-up and his wound was healing well.  Will follow-up again next week for suture removal.  A/P Healing well status post left great toe amputation 2 weeks ago.  -Keep orthopedics follow-up next week -Emphasized importance of tobacco cessation and glycemic control for wound healing

## 2016-09-01 NOTE — Telephone Encounter (Signed)
Pt seen today in [facility] for HFU.Despina Hidden Cassady6/6/20184:20 PM

## 2016-09-01 NOTE — Patient Instructions (Addendum)
For your high blood pressure, make sure you take your medicines every day as prescribed. I have increased the dose of your amlodipine to 10 mg daily (you can take 2 of the 5 mg pills you have right now until you run out).  For depression, please start taking citalopram every day.  To make sure that your foot heals up and you don't have further amputations, it is very important that you stop smoking and get your blood sugars under control.  Please check your blood sugars twice a day; once first thing in the morning, and once before or after a meal. Take your Levemir every night and your Humalog with your largest meal of the day. I have also increased your metformin dose to 2000 mg per day (you can take two of the 500 mg pills you have twice per day until you run out) and asked for our diabetes educator to set up an appointment with you.    Please call 1-800-QUIT NOW for more help quitting smoking.  Steps to Quit Smoking Smoking tobacco can be bad for your health. It can also affect almost every organ in your body. Smoking puts you and people around you at risk for many serious long-lasting (chronic) diseases. Quitting smoking is hard, but it is one of the best things that you can do for your health. It is never too late to quit. What are the benefits of quitting smoking? When you quit smoking, you lower your risk for getting serious diseases and conditions. They can include:  Lung cancer or lung disease.  Heart disease.  Stroke.  Heart attack.  Not being able to have children (infertility).  Weak bones (osteoporosis) and broken bones (fractures).  If you have coughing, wheezing, and shortness of breath, those symptoms may get better when you quit. You may also get sick less often. If you are pregnant, quitting smoking can help to lower your chances of having a baby of low birth weight. What can I do to help me quit smoking? Talk with your doctor about what can help you quit smoking.  Some things you can do (strategies) include:  Quitting smoking totally, instead of slowly cutting back how much you smoke over a period of time.  Going to in-person counseling. You are more likely to quit if you go to many counseling sessions.  Using resources and support systems, such as: ? Database administrator with a Social worker. ? Phone quitlines. ? Careers information officer. ? Support groups or group counseling. ? Text messaging programs. ? Mobile phone apps or applications.  Taking medicines. Some of these medicines may have nicotine in them. If you are pregnant or breastfeeding, do not take any medicines to quit smoking unless your doctor says it is okay. Talk with your doctor about counseling or other things that can help you.  Talk with your doctor about using more than one strategy at the same time, such as taking medicines while you are also going to in-person counseling. This can help make quitting easier. What things can I do to make it easier to quit? Quitting smoking might feel very hard at first, but there is a lot that you can do to make it easier. Take these steps:  Talk to your family and friends. Ask them to support and encourage you.  Call phone quitlines, reach out to support groups, or work with a Social worker.  Ask people who smoke to not smoke around you.  Avoid places that make you want (trigger) to  smoke, such as: ? Bars. ? Parties. ? Smoke-break areas at work.  Spend time with people who do not smoke.  Lower the stress in your life. Stress can make you want to smoke. Try these things to help your stress: ? Getting regular exercise. ? Deep-breathing exercises. ? Yoga. ? Meditating. ? Doing a body scan. To do this, close your eyes, focus on one area of your body at a time from head to toe, and notice which parts of your body are tense. Try to relax the muscles in those areas.  Download or buy apps on your mobile phone or tablet that can help you stick to your  quit plan. There are many free apps, such as QuitGuide from the [employer] Office manager for Disease Control and Prevention). You can find more support from smokefree.gov and other websites.  This information is not intended to replace advice given to you by your health care provider. Make sure you discuss any questions you have with your health care provider. Document Released: 01/09/2009 Document Revised: 11/11/2015 Document Reviewed: 07/30/2014 Elsevier Interactive Patient Education  2018 [employer].

## 2016-09-01 NOTE — Progress Notes (Signed)
Medications were reviewed with patient. He states he is doing well, may need help with insulin and NRT samples while awaiting insurance coverage. Unable to work with patient for full visit due to wanting to go home. He has a follow up appointment scheduled in [facility] 09/03/16 and can touch base that day.  Patient verbalized understanding by repeating back information and was advised to contact me if further medication-related questions arise.

## 2016-09-02 ENCOUNTER — Encounter: Payer: Self-pay | Admitting: Internal Medicine

## 2016-09-02 LAB — BMP8+ANION GAP
Anion Gap: 13 mmol/L (ref 10.0–18.0)
BUN / CREAT RATIO: 15 (ref 9–20)
BUN: 14 mg/dL (ref 6–24)
CO2: 26 mmol/L (ref 18–29)
CREATININE: 0.94 mg/dL (ref 0.76–1.27)
Calcium: 9.2 mg/dL (ref 8.7–10.2)
Chloride: 98 mmol/L (ref 96–106)
GFR calc Af Amer: 112 mL/min/{1.73_m2} (ref >59)
GFR, EST NON AFRICAN AMERICAN: 97 mL/min/{1.73_m2} (ref >59)
Glucose: 308 mg/dL — ABNORMAL HIGH (ref 65–99)
Potassium: 4.6 mmol/L (ref 3.5–5.2)
SODIUM: 137 mmol/L (ref 134–144)

## 2016-09-02 LAB — LIPID PANEL
CHOL/HDL RATIO: 2.6 ratio (ref 0.0–5.0)
CHOLESTEROL TOTAL: 126 mg/dL (ref 100–199)
HDL: 48 mg/dL (ref >39)
LDL CALC: 62 mg/dL (ref 0–99)
TRIGLYCERIDES: 79 mg/dL (ref 0–149)
VLDL CHOLESTEROL CAL: 16 mg/dL (ref 5–40)

## 2016-09-02 LAB — CBC
HEMATOCRIT: 38.9 % (ref 37.5–51.0)
HEMOGLOBIN: 12.2 g/dL — AB (ref 13.0–17.7)
MCH: 27.5 pg (ref 26.6–33.0)
MCHC: 31.4 g/dL — AB (ref 31.5–35.7)
MCV: 88 fL (ref 79–97)
Platelets: 379 10*3/uL (ref 150–379)
RBC: 4.43 x10E6/uL (ref 4.14–5.80)
RDW: 15 % (ref 12.3–15.4)
WBC: 11.5 10*3/uL — ABNORMAL HIGH (ref 3.4–10.8)

## 2016-09-03 ENCOUNTER — Ambulatory Visit: Payer: Self-pay

## 2016-09-03 ENCOUNTER — Ambulatory Visit: Payer: Self-pay | Admitting: Pharmacist

## 2016-09-03 NOTE — Progress Notes (Signed)
Internal Medicine Clinic Attending  Case discussed with Dr. O'Sullivan at the time of the visit.  We reviewed the resident's history and exam and pertinent patient test results.  I agree with the assessment, diagnosis, and plan of care documented in the resident's note. 

## 2016-09-07 ENCOUNTER — Ambulatory Visit (INDEPENDENT_AMBULATORY_CARE_PROVIDER_SITE_OTHER): Payer: Self-pay | Admitting: Orthopedic Surgery

## 2016-09-07 ENCOUNTER — Telehealth (INDEPENDENT_AMBULATORY_CARE_PROVIDER_SITE_OTHER): Payer: Self-pay | Admitting: Radiology

## 2016-09-07 DIAGNOSIS — IMO0002 Reserved for concepts with insufficient information to code with codable children: Secondary | ICD-10-CM

## 2016-09-07 DIAGNOSIS — Z89412 Acquired absence of left great toe: Secondary | ICD-10-CM

## 2016-09-07 NOTE — Progress Notes (Signed)
Office Visit Note   Patient: [name]           Date of Birth: 07-Aug-1970           MRN: [id number] Visit Date: 09/07/2016              Requested by: Shawna Orleans, Doe-Hyun R, DO No address on file PCP: Shawna Orleans, Doe-Hyun R, DO  No chief complaint on file.     HPI: Patient is a 46 shell gentleman who is 2 weeks status post amputation left great toe at the MTP joint he has no complaints he is ambulating in a postoperative shoe for weightbearing  Assessment & Plan: Visit Diagnoses:  1. Great toe amputation status, left ([place])     Plan: We will harvest the sutures today recommended a stiff soled shoe such as a new balance walking shoe to take pressure off the forefoot he may return to work as he feels comfortable. Follow-up in 4 weeks. He is to wash the foot with soap and water and use moisturizing lotion for scar massage.  Follow-Up Instructions: Return in about 4 weeks (around 10/05/2016).   Ortho Exam  Patient is alert, oriented, no adenopathy, well-dressed, normal affect, normal respiratory effort. Examination the surgical incision is well-healed there is no redness no cellulitis no drainage no signs of infection.  Imaging: No results found.  Labs: Lab Results  Component Value Date   HGBA1C 9.8 (H) 08/20/2016   HGBA1C 7.0 (H) 11/28/2012   HGBA1C 10.6 (H) 08/04/2012   ESRSEDRATE 54 (H) 08/20/2016   ESRSEDRATE 8 07/24/2012   ESRSEDRATE 4 04/19/2008   CRP 2.2 (H) 08/20/2016   REPTSTATUS 08/25/2016 FINAL 08/20/2016   GRAMSTAIN  07/24/2012    CYTOSPIN NO WBC SEEN NO ORGANISMS SEEN Gram Stain Report Called to,Read Back By and Verified With: R.[id number]/[email or web address]   CULT NO GROWTH 5 DAYS 08/20/2016    Orders:  No orders of the defined types were placed in this encounter.  No orders of the defined types were placed in this encounter.    Procedures: No procedures performed  Clinical Data: No additional findings.  ROS:  All other systems negative, except as  noted in the HPI. Review of Systems  Objective: Vital Signs: There were no vitals taken for this visit.  Specialty Comments:  No specialty comments available.  PMFS History: Patient Active Problem List   Diagnosis Date Noted  . Depression 09/01/2016  . Chronic osteomyelitis of left foot with draining sinus ([name])   . 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 Coronary atherosclerosis of native coronary artery 08/17/2013  . Other and unspecified angina pectoris   . Diabetic retinopathy ([place]) 02/02/2013  . Diabetic neuropathy ([place]) 12/01/2012  . Hypertension 08/03/2012  . Tobacco use disorder 08/03/2012  . Erectile dysfunction 08/03/2012  . Migraine 08/03/2012  . Diabetes mellitus with neurological manifestations, uncontrolled ([place]) 05/26/2006   Past Medical History:  Diagnosis Date  . Diabetes mellitus type II, uncontrolled ([place])   . Hyperlipidemia   . Hypertension   . Other and unspecified angina pectoris   . Tobacco use     Family History  Problem Relation Age of Onset  . Cancer Mother   . Diabetes Father   . Hypertension Father     Past Surgical History:  Procedure Laterality Date  . AMPUTATION Left 08/22/2016   Procedure: GREAT TOE AMPUTATION;  Surgeon: [name], MD;  Location: [facility];  Service: Orthopedics;  Laterality: Left;  . LEFT  HEART CATHETERIZATION WITH CORONARY ANGIOGRAM N/A 07/31/2013   Procedure: LEFT HEART CATHETERIZATION WITH CORONARY ANGIOGRAM;  Surgeon: Jettie Booze, MD;  Location: [facility] CATH LAB;  Service: Cardiovascular;  Laterality: N/A;  . 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 Smoking status: Current Every Day Smoker    Packs/day: 1.00    Years: 20.00    Types: Cigarettes  . Smokeless tobacco: Never Used     Comment: wants patches   . Alcohol use No     Comment: occ  . Drug use: No  . Sexual activity: Not Currently

## 2016-09-07 NOTE — Telephone Encounter (Signed)
Office notes, operative note, MRI and xray report all faxed to [facility] [phone] Claim number [id number] per patient request

## 2016-09-08 ENCOUNTER — Ambulatory Visit: Payer: Self-pay

## 2016-09-15 ENCOUNTER — Ambulatory Visit: Payer: Self-pay | Admitting: Dietician

## 2016-09-15 ENCOUNTER — Ambulatory Visit (INDEPENDENT_AMBULATORY_CARE_PROVIDER_SITE_OTHER): Payer: Self-pay | Admitting: Internal Medicine

## 2016-09-15 VITALS — BP 162/86 | HR 97 | Temp 98.1°F | Wt 186.2 lb

## 2016-09-15 DIAGNOSIS — Z9114 Patient's other noncompliance with medication regimen: Secondary | ICD-10-CM | POA: Insufficient documentation

## 2016-09-15 DIAGNOSIS — E13621 Other specified diabetes mellitus with foot ulcer: Secondary | ICD-10-CM

## 2016-09-15 DIAGNOSIS — L97509 Non-pressure chronic ulcer of other part of unspecified foot with unspecified severity: Secondary | ICD-10-CM

## 2016-09-15 DIAGNOSIS — E1165 Type 2 diabetes mellitus with hyperglycemia: Secondary | ICD-10-CM

## 2016-09-15 DIAGNOSIS — I2510 Atherosclerotic heart disease of native coronary artery without angina pectoris: Secondary | ICD-10-CM

## 2016-09-15 DIAGNOSIS — IMO0002 Reserved for concepts with insufficient information to code with codable children: Secondary | ICD-10-CM

## 2016-09-15 DIAGNOSIS — Z89411 Acquired absence of right great toe: Secondary | ICD-10-CM

## 2016-09-15 DIAGNOSIS — F331 Major depressive disorder, recurrent, moderate: Secondary | ICD-10-CM

## 2016-09-15 DIAGNOSIS — I10 Essential (primary) hypertension: Secondary | ICD-10-CM

## 2016-09-15 DIAGNOSIS — H43391 Other vitreous opacities, right eye: Secondary | ICD-10-CM | POA: Insufficient documentation

## 2016-09-15 DIAGNOSIS — E1149 Type 2 diabetes mellitus with other diabetic neurological complication: Secondary | ICD-10-CM

## 2016-09-15 DIAGNOSIS — F17210 Nicotine dependence, cigarettes, uncomplicated: Secondary | ICD-10-CM

## 2016-09-15 DIAGNOSIS — E11621 Type 2 diabetes mellitus with foot ulcer: Secondary | ICD-10-CM

## 2016-09-15 DIAGNOSIS — E1140 Type 2 diabetes mellitus with diabetic neuropathy, unspecified: Secondary | ICD-10-CM

## 2016-09-15 NOTE — Progress Notes (Signed)
   CC: "I'll be honest with you doc, I haven't been taking the medicines."  HPI:  [name] is a 46 y.o. man with history of DM, HTN, and non-obstructive CAD who presents for further management of diabetes.  Please see A&P for status of the patient's chronic medical conditions.  Past Medical History:  Diagnosis Date  . Diabetes mellitus type II, uncontrolled ([place])   . Hyperlipidemia   . Hypertension   . Other and unspecified angina pectoris   . Tobacco use     Review of Systems:    Review of Systems  Constitutional: Negative for chills and fever.  Respiratory: Negative for cough and shortness of breath.   Cardiovascular: Negative for chest pain and palpitations.  Musculoskeletal:       Foot pain is improving  Psychiatric/Behavioral: Positive for depression. Negative for suicidal ideas. The patient is nervous/anxious.        Irritable    Physical Exam:  Vitals:   09/15/16 0957  BP: (!) 162/86  Pulse: 97  Temp: 98.1 F (36.7 C)  TempSrc: Oral  Weight: 186 lb 3.2 oz (84.5 kg)     Physical Exam  Constitutional: He is oriented to person, place, and time. He appears well-developed and well-nourished. No distress.  Eyes: Conjunctivae and EOM are normal. Pupils are equal, round, and reactive to light.  Central visual acuity grossly intact, if the region name badge at 5 feet with each eye  Cardiovascular: Normal rate and regular rhythm.   Pulmonary/Chest: Effort normal and breath sounds normal.  Musculoskeletal:  Right foot status post amputation of great toe MTP, healing well with no exudate, erythema, bleeding, or edema  Neurological: He is alert and oriented to person, place, and time.  Psychiatric: He has a normal mood and affect. His behavior is normal.    Assessment & Plan:   See Encounters Tab for problem based charting.  Patient discussed with Dr. Evette Doffing

## 2016-09-15 NOTE — Patient Instructions (Addendum)
Make sure you focus on taking your medicines every day.  You can take the Levemir insulin at any time of day as long as it's around the same time every day.    We'll see you back in about 2 months. Use those 2 months to take control of your health and make sure you're taking her medicines.  I'm worried that you will have more amputations in the future and that you're going to really regret not having taken the medicines.

## 2016-09-15 NOTE — Assessment & Plan Note (Addendum)
[name] says that he has not been taking any of his medicines, which he attributes to stressful living situation staying with his father.  He says they are like to "alpha dogs "going head-to-head time, constantly fighting and bickering. Says this environment has been distracting and has been unable to focus on his own health and taking medicines because of that.  He says that he knows taking his medicines is important and he is motivated to do so, and is confident that he will be able to do so.  One change that he thinks will improve his adherence is planning to take his medicines first thing when he gets to work, so he is not distracted uncomfortable but his home situation.  He is also hoping to move out from his father's house and find his own housing, and is willing to consider homeless shelters.  -Promoted medication adherence through motivational interviewing techniques

## 2016-09-15 NOTE — Assessment & Plan Note (Addendum)
Lab Results  Component Value Date   HGBA1C 9.8 (H) 08/20/2016   HGBA1C 7.0 (H) 11/28/2012   HGBA1C 10.6 (H) 08/04/2012   No results for input(s): GLUCAP in the last 72 hours.  Current medications: (not taking) Metformin 2000 mg daily Current insulin: (not taking) Levemir 10 units daily at bedtime, Humalog 10 units with largest meal  Assessment HgbA1c goal: <7.0 Glycemic control: uncontrolled Complications: neuropathy  Uncontrolled diabetes secondary to medication nonadherence.  Plan Medications: Continue current indication Insulin: Continue current insulin Other: -Use motivational interviewing to encourage adherence -needs ophtho referral when insurance becomes active

## 2016-09-15 NOTE — Assessment & Plan Note (Signed)
Since he woke up this morning he reports seeing 2 black spots in superior visual field of his right eye.  They look like small dots with linear extensions sticking out from the, and they move slightlybit when he moves his eyes.  They are not preventing him from reading or seeing. He denies any eye pain, loss of vision, or flashing lights.  A/P Symptoms consistent with vitreous floaters.  Vitreous hemorrhage is also a concern in this uncontrolled diabetic without DR screening.  -Continue to monitor symptoms; if numerous new floaters, flashes of light, decreased vision, or eye pain, go to ED -Needs ophthalmology referral for evaluation and by retinopathy screening when insurance becomes active

## 2016-09-15 NOTE — Assessment & Plan Note (Addendum)
BP Readings from Last 3 Encounters:  09/01/16 (!) 168/91  08/26/16 (!) 166/104  08/25/16 (!) 146/89   Lab Results  Component Value Date   CREATININE 0.94 09/01/2016   Lab Results  Component Value Date   K 4.6 09/01/2016   Current medications: (not taking) amlodipine 10 mg daily, Lisinopril 20 mg daily, metoprolol 25 mg twice a day   Assessment BP goal: <130/80 BP control: uncontrolled  Essential hypertension uncontrolled secondary to medication noncompliance.  Plan Medications: Continue current prescription Other: -motivational interviewing for medication adherence

## 2016-09-15 NOTE — Assessment & Plan Note (Signed)
  Depression screen [facility] 2/9 09/01/2016 08/26/2016  Decreased Interest 1 1  Down, Depressed, Hopeless 1 2  PHQ - 2 Score 2 3  Altered sleeping 1 2  Tired, decreased energy 1 2  Change in appetite 3 2  Feeling bad or failure about yourself  1 1  Trouble concentrating 3 1  Moving slowly or fidgety/restless 3 0  Suicidal thoughts 0 0  PHQ-9 Score 14 11  Difficult doing work/chores Very difficult -    Current medications: citalopram 20 mg daily  Previous medications: none  Assessment Active major depression with moderate severity symptoms.  Plan  Medications:   Other:

## 2016-09-15 NOTE — Assessment & Plan Note (Addendum)
He saw orthopedic surgeon Dr. Sharol Given last week for two-week follow-up after right great toe amputation. At that time sutures were removed, he was cleared to return to work and recommended to wear a stiff soled shoe.  His wound is healing well and he is able to ambulate. He has been back at work for 2 days.  A/P Surgical wound healing well. -Follow-up with orthopedic surgery as scheduled

## 2016-09-15 NOTE — Progress Notes (Signed)
Internal Medicine Clinic Attending  Case discussed with Dr. O'Sullivan at the time of the visit.  We reviewed the resident's history and exam and pertinent patient test results.  I agree with the assessment, diagnosis, and plan of care documented in the resident's note. 

## 2016-09-16 ENCOUNTER — Ambulatory Visit: Payer: Self-pay | Admitting: Internal Medicine

## 2016-09-23 ENCOUNTER — Ambulatory Visit: Payer: Self-pay | Admitting: Dietician

## 2016-09-30 NOTE — Addendum Note (Signed)
Addended by: Hulan Fray on: 09/30/2016 04:45 PM   Modules accepted: Orders

## 2016-10-05 ENCOUNTER — Ambulatory Visit (INDEPENDENT_AMBULATORY_CARE_PROVIDER_SITE_OTHER): Payer: MEDICAID | Admitting: Orthopedic Surgery

## 2016-12-27 ENCOUNTER — Telehealth: Payer: Self-pay | Admitting: *Deleted

## 2016-12-27 ENCOUNTER — Emergency Department (HOSPITAL_COMMUNITY)
Admission: EM | Admit: 2016-12-27 | Discharge: 2016-12-27 | Disposition: A | Discharge status: Home / Self Care | Payer: Self-pay | Attending: Emergency Medicine | Admitting: Emergency Medicine

## 2016-12-27 ENCOUNTER — Ambulatory Visit: Payer: Self-pay

## 2016-12-27 ENCOUNTER — Encounter (HOSPITAL_COMMUNITY): Payer: Self-pay | Admitting: *Deleted

## 2016-12-27 DIAGNOSIS — E11319 Type 2 diabetes mellitus with unspecified diabetic retinopathy without macular edema: Secondary | ICD-10-CM | POA: Insufficient documentation

## 2016-12-27 DIAGNOSIS — Z794 Long term (current) use of insulin: Secondary | ICD-10-CM | POA: Insufficient documentation

## 2016-12-27 DIAGNOSIS — Z7982 Long term (current) use of aspirin: Secondary | ICD-10-CM | POA: Insufficient documentation

## 2016-12-27 DIAGNOSIS — Z79899 Other long term (current) drug therapy: Secondary | ICD-10-CM | POA: Insufficient documentation

## 2016-12-27 DIAGNOSIS — E1140 Type 2 diabetes mellitus with diabetic neuropathy, unspecified: Secondary | ICD-10-CM | POA: Insufficient documentation

## 2016-12-27 DIAGNOSIS — F17210 Nicotine dependence, cigarettes, uncomplicated: Secondary | ICD-10-CM | POA: Insufficient documentation

## 2016-12-27 DIAGNOSIS — I2510 Atherosclerotic heart disease of native coronary artery without angina pectoris: Secondary | ICD-10-CM | POA: Insufficient documentation

## 2016-12-27 DIAGNOSIS — I10 Essential (primary) hypertension: Secondary | ICD-10-CM | POA: Insufficient documentation

## 2016-12-27 DIAGNOSIS — M79675 Pain in left toe(s): Secondary | ICD-10-CM | POA: Insufficient documentation

## 2016-12-27 LAB — URINALYSIS, ROUTINE W REFLEX MICROSCOPIC
BACTERIA UA: NONE SEEN
Bilirubin Urine: NEGATIVE
GLUCOSE, UA: 150 mg/dL — AB
HGB URINE DIPSTICK: NEGATIVE
KETONES UR: NEGATIVE mg/dL
LEUKOCYTES UA: NEGATIVE
NITRITE: NEGATIVE
Specific Gravity, Urine: 1.026 (ref 1.005–1.030)
pH: 5 (ref 5.0–8.0)

## 2016-12-27 LAB — CBC
HEMATOCRIT: 38.7 % — AB (ref 39.0–52.0)
HEMOGLOBIN: 12.4 g/dL — AB (ref 13.0–17.0)
MCH: 27.3 pg (ref 26.0–34.0)
MCHC: 32 g/dL (ref 30.0–36.0)
MCV: 85.1 fL (ref 78.0–100.0)
Platelets: 275 10*3/uL (ref 150–400)
RBC: 4.55 MIL/uL (ref 4.22–5.81)
RDW: 14.4 % (ref 11.5–15.5)
WBC: 10.7 10*3/uL — AB (ref 4.0–10.5)

## 2016-12-27 LAB — BASIC METABOLIC PANEL
ANION GAP: 7 (ref 5–15)
BUN: 15 mg/dL (ref 6–20)
CO2: 28 mmol/L (ref 22–32)
Calcium: 9.2 mg/dL (ref 8.9–10.3)
Chloride: 103 mmol/L (ref 101–111)
Creatinine, Ser: 1.32 mg/dL — ABNORMAL HIGH (ref 0.61–1.24)
Glucose, Bld: 197 mg/dL — ABNORMAL HIGH (ref 65–99)
POTASSIUM: 4.2 mmol/L (ref 3.5–5.1)
SODIUM: 138 mmol/L (ref 135–145)

## 2016-12-27 LAB — CBG MONITORING, ED: Glucose-Capillary: 218 mg/dL — ABNORMAL HIGH (ref 65–99)

## 2016-12-27 MED ORDER — SULFAMETHOXAZOLE-TRIMETHOPRIM 800-160 MG PO TABS: 1.0000 | ORAL_TABLET | Freq: Two times a day (BID) | ORAL | 0 refills | Status: AC

## 2016-12-27 MED ORDER — IBUPROFEN 600 MG PO TABS: 600.0000 mg | ORAL_TABLET | Freq: Four times a day (QID) | ORAL | 0 refills | Status: DC | PRN

## 2016-12-27 NOTE — Telephone Encounter (Signed)
Ok thank you 

## 2016-12-27 NOTE — Discharge Instructions (Signed)
Taken Bactrim, twice a day for the next 10 days  Ibuprofen 3 times a day as needed for no more than 5 days  Please call the clinic above for a follow-up appointment.  Emergency department for increased redness swelling pain or fevers.

## 2016-12-27 NOTE — Telephone Encounter (Signed)
Per EPIC, pt went to the ED.

## 2016-12-27 NOTE — ED Triage Notes (Signed)
PT states he had left big toe amputated a couple of months ago.  Pt states swollen and painful.  Pt states he is not been checking blood sugar but takes insulin.

## 2016-12-27 NOTE — Telephone Encounter (Signed)
Pt came to the the clinic to be seen since he works here at [employer]. Stated he had a toe amputated on his left foot this year - now having pain and foot is swollen. Asked if he called the surgeon who did the surgery; stated no, since he works here, decided to come to the clinic. No morning appts available - 1345 PM given to pt in [facility] or go to the ED if unable to wait.

## 2016-12-27 NOTE — ED Provider Notes (Signed)
[facility] DEPT Provider Note   CSN: [id number] Arrival date & time: 12/27/16  1109     History   Chief Complaint Chief Complaint  Patient presents with  . Toe Pain    Infection    HPI [name] is a 46 y.o. male.  HPI  The patient is a 46 year old male, known history of diabetes, hypertension and hyperlipidemia who had a left great toe amputation a couple of months ago by Dr. Sharol Given, his postoperative course has been complicated only by mild persistent pain at that area. He reports that last night around midnight he developed increasing amounts of pain and feels like his foot and toes are slightly swollen. He denies fevers or chills, the pain has been persistent since over the last 12 hours, worse with ambulation. He also states that he has had chronic swelling in his left lower extremity which is not new.  Past Medical History:  Diagnosis Date  . Diabetes mellitus type II, uncontrolled ([place])   . Hyperlipidemia   . Hypertension   . Other and unspecified angina pectoris   . Tobacco use     Patient Active Problem List   Diagnosis Date Noted  . Vitreous floaters of right eye 09/15/2016  . Nonadherence to medication 09/15/2016  . Depression 09/01/2016  . Foot ulcer due to secondary DM ([place]) 08/20/2016  . Coronary atherosclerosis of native coronary artery 08/17/2013  . Diabetic retinopathy ([place]) 02/02/2013  . Diabetic neuropathy ([place]) 12/01/2012  . Hypertension 08/03/2012  . Tobacco use disorder 08/03/2012  . Erectile dysfunction 08/03/2012  . Diabetes mellitus with neurological manifestations, uncontrolled ([place]) 05/26/2006    Past Surgical History:  Procedure Laterality Date  . AMPUTATION Left 08/22/2016   Procedure: GREAT TOE AMPUTATION;  Surgeon: [name], MD;  Location: [facility];  Service: Orthopedics;  Laterality: Left;  . LEFT HEART CATHETERIZATION WITH CORONARY ANGIOGRAM N/A 07/31/2013   Procedure: LEFT HEART CATHETERIZATION WITH CORONARY ANGIOGRAM;   Surgeon: Jettie Booze, MD;  Location: [facility] CATH LAB;  Service: Cardiovascular;  Laterality: N/A;  . spinal tap         Home Medications    Prior to Admission medications   Medication Sig Start Date End Date Taking? Authorizing Provider  amLODipine (NORVASC) 10 MG tablet Take 1 tablet (10 mg total) by mouth daily. 09/01/16 11/30/16  Minus Liberty, MD  aspirin EC 81 MG tablet Take 81 mg by mouth daily. 02/11/16   [provider]  atorvastatin (LIPITOR) 20 MG tablet Take 1 tablet (20 mg total) by mouth daily. 08/23/16 11/21/16  Minus Liberty, MD  blood glucose meter kit and supplies KIT Dispense based on patient and insurance preference. Use up to four times daily as directed. (FOR ICD-9 250.00, 250.01). 08/25/16   Minus Liberty, MD  citalopram (CELEXA) 20 MG tablet Take 1 tablet (20 mg total) by mouth daily. 09/01/16 09/01/17  Minus Liberty, MD  Cyanocobalamin (VITAMIN B-12 PO) Take 1 capsule by mouth daily.    [provider]  gabapentin (NEURONTIN) 300 MG capsule Take 1 capsule (300 mg total) by mouth 3 (three) times daily. 08/23/16 11/21/16  Minus Liberty, MD  ibuprofen (ADVIL,MOTRIN) 600 MG tablet Take 1 tablet (600 mg total) by mouth every 6 (six) hours as needed. 12/27/16   Noemi Chapel, MD  insulin detemir (LEVEMIR) 100 UNIT/ML injection Inject 0.1 mLs (10 Units total) into the skin at bedtime. 08/25/16 11/23/16  Minus Liberty, MD  insulin lispro (HUMALOG) 100 UNIT/ML injection Inject 0.1 mLs (10  Units total) into the skin daily with supper. 08/24/16 11/22/16  Minus Liberty, MD  Insulin Syringe-Needle U-100 (INSULIN SYRINGE .5CC/31GX5/16") 31G X 5/16" 0.5 ML MISC 1 each by Does not apply route as needed. 08/24/16   Minus Liberty, MD  lisinopril (PRINIVIL,ZESTRIL) 20 MG tablet Take 1 tablet (20 mg total) by mouth daily. 08/23/16 11/21/16  Minus Liberty, MD  metFORMIN (GLUCOPHAGE) 1000 MG tablet Take 0.5 tablets (500 mg total) by  mouth 2 (two) times daily with a meal. 09/01/16 11/30/16  Minus Liberty, MD  metoprolol tartrate (LOPRESSOR) 25 MG tablet Take 1 tablet (25 mg total) by mouth 2 (two) times daily. 08/23/16 11/21/16  Minus Liberty, MD  nicotine (NICODERM CQ - DOSED IN MG/24 HOURS) 21 mg/24hr patch Place 1 patch (21 mg total) onto the skin daily. 08/30/16   Suzan Slick, NP  nitroGLYCERIN (NITROSTAT) 0.4 MG SL tablet Place 1 tablet (0.4 mg total) under the tongue every 5 (five) minutes as needed for chest pain. 07/26/13   Jettie Booze, MD  ondansetron (ZOFRAN) 4 MG tablet Take 1 tablet (4 mg total) by mouth every 6 (six) hours as needed for nausea. 08/25/16   Minus Liberty, MD  senna-docusate (SENOKOT-S) 8.6-50 MG tablet While taking oxycodone, take 1-2 pills twice daily as needed to have a bowel movement daily. 08/23/16   Minus Liberty, MD  sildenafil (REVATIO) 20 MG tablet Take 20 mg by mouth daily as needed for erectile dysfunction. 07/07/15   [provider]  sulfamethoxazole-trimethoprim (BACTRIM DS,SEPTRA DS) 800-160 MG tablet Take 1 tablet by mouth 2 (two) times daily. 12/27/16 01/03/17  Noemi Chapel, MD    Family History Family History  Problem Relation Age of Onset  . Cancer Mother   . Diabetes Father   . Hypertension Father     Social History Social History  Substance Use Topics  . Smoking status: Current Every Day Smoker    Packs/day: 1.00    Years: 20.00    Types: Cigarettes  . Smokeless tobacco: Never Used     Comment: wants patches   . Alcohol use No     Comment: occ     Allergies   Bee venom and Penicillins   Review of Systems Review of Systems  Constitutional: Negative for fever.  Respiratory: Negative for cough, chest tightness and shortness of breath.   Cardiovascular: Positive for leg swelling. Negative for chest pain.  Musculoskeletal: Positive for joint swelling.     Physical Exam Updated Vital Signs BP (!) 147/99 (BP Location: Left Arm)    Pulse 98   Temp 97.9 F (36.6 C) (Oral)   Resp 18   Ht '5\' 4"'$  (1.626 m)   Wt 81.6 kg (180 lb)   SpO2 100%   BMI 30.90 kg/m   Physical Exam  Constitutional: He appears well-developed and well-nourished. No distress.  HENT:  Head: Normocephalic and atraumatic.  Mouth/Throat: Oropharynx is clear and moist. No oropharyngeal exudate.  Eyes: Pupils are equal, round, and reactive to light. Conjunctivae and EOM are normal. Right eye exhibits no discharge. Left eye exhibits no discharge. No scleral icterus.  Neck: Normal range of motion. Neck supple. No JVD present. No thyromegaly present.  Cardiovascular: Normal rate, regular rhythm, normal heart sounds and intact distal pulses.  Exam reveals no gallop and no friction rub.   No murmur heard. Pulmonary/Chest: Effort normal and breath sounds normal. No respiratory distress. He has no wheezes. He has no rales.  Abdominal: Soft. Bowel sounds are normal. He exhibits  no distension and no mass. There is no tenderness.  Musculoskeletal: Normal range of motion. He exhibits no edema or tenderness.  The patient has mild tenderness at the surgical bed of the left great toe. The incision site is intact, there is no redness, there is no warmth, there is slight swelling of the left foot compared to the right foot. The other 4 toes of the left foot seemed to have good range of motion without tenderness. There is mild pitting edema of the left lower extremity compared to the right. He states this is chronic.  Lymphadenopathy:    He has no cervical adenopathy.  Neurological: He is alert. Coordination normal.  Skin: Skin is warm and dry. No rash noted. No erythema.  Psychiatric: He has a normal mood and affect. His behavior is normal.  Nursing note and vitals reviewed.    ED Treatments / Results  Labs (all labs ordered are listed, but only abnormal results are displayed) Labs Reviewed  BASIC METABOLIC PANEL - Abnormal; Notable for the following:        Result Value   Glucose, Bld 197 (*)    Creatinine, Ser 1.32 (*)    All other components within normal limits  CBC - Abnormal; Notable for the following:    WBC 10.7 (*)    Hemoglobin 12.4 (*)    HCT 38.7 (*)    All other components within normal limits  CBG MONITORING, ED - Abnormal; Notable for the following:    Glucose-Capillary 218 (*)    All other components within normal limits  URINALYSIS, ROUTINE W REFLEX MICROSCOPIC    EKG  EKG Interpretation None       Radiology No results found.  Procedures Procedures (including critical care time)  Medications Ordered in ED Medications - No data to display   Initial Impression / Assessment and Plan / ED Course  I have reviewed the triage vital signs and the nursing notes.  Pertinent labs & imaging results that were available during my care of the patient were reviewed by me and considered in my medical decision making (see chart for details).    Thankfully white blood cell count is unremarkable, renal function is consistent with a slight chronic renal insufficiency. I do not think imaging is necessary as he may have a very early infection, otherwise he has had some chronic pain since the surgery. Nothing on his exam is suggestive of a systemic or aggressive infection. I will start a course of Bactrim in case he has a mild early staph infection. Short course of anti-inflammatories and close follow-up. He also has requested smoking cessation with a family doctor. He does not have one, he will be [facility].   Final Clinical Impressions(s) / ED Diagnoses   Final diagnoses:  Pain of toe of left foot    New Prescriptions New Prescriptions   IBUPROFEN (ADVIL,MOTRIN) 600 MG TABLET    Take 1 tablet (600 mg total) by mouth every 6 (six) hours as needed.   SULFAMETHOXAZOLE-TRIMETHOPRIM (BACTRIM DS,SEPTRA DS) 800-160 MG TABLET    Take 1 tablet by mouth 2 (two) times daily.     Noemi Chapel, MD 12/27/16 [phone]

## 2017-04-07 LAB — HM DIABETES EYE EXAM

## 2017-04-25 ENCOUNTER — Encounter (HOSPITAL_COMMUNITY): Payer: Self-pay | Admitting: *Deleted

## 2017-04-25 ENCOUNTER — Emergency Department (HOSPITAL_COMMUNITY): Payer: BLUE CROSS/BLUE SHIELD

## 2017-04-25 ENCOUNTER — Emergency Department (HOSPITAL_COMMUNITY)
Admission: EM | Admit: 2017-04-25 | Discharge: 2017-04-25 | Disposition: A | Discharge status: Home / Self Care | Payer: BLUE CROSS/BLUE SHIELD | Attending: Emergency Medicine | Admitting: Emergency Medicine

## 2017-04-25 DIAGNOSIS — Z79899 Other long term (current) drug therapy: Secondary | ICD-10-CM | POA: Diagnosis not present

## 2017-04-25 DIAGNOSIS — Z7982 Long term (current) use of aspirin: Secondary | ICD-10-CM | POA: Insufficient documentation

## 2017-04-25 DIAGNOSIS — M25572 Pain in left ankle and joints of left foot: Secondary | ICD-10-CM | POA: Diagnosis not present

## 2017-04-25 DIAGNOSIS — I10 Essential (primary) hypertension: Secondary | ICD-10-CM | POA: Diagnosis not present

## 2017-04-25 DIAGNOSIS — R079 Chest pain, unspecified: Secondary | ICD-10-CM | POA: Diagnosis present

## 2017-04-25 DIAGNOSIS — F17210 Nicotine dependence, cigarettes, uncomplicated: Secondary | ICD-10-CM | POA: Insufficient documentation

## 2017-04-25 DIAGNOSIS — R0789 Other chest pain: Secondary | ICD-10-CM | POA: Diagnosis not present

## 2017-04-25 DIAGNOSIS — E119 Type 2 diabetes mellitus without complications: Secondary | ICD-10-CM | POA: Diagnosis not present

## 2017-04-25 LAB — BASIC METABOLIC PANEL
Anion gap: 6 (ref 5–15)
BUN: 16 mg/dL (ref 6–20)
CALCIUM: 9 mg/dL (ref 8.9–10.3)
CO2: 26 mmol/L (ref 22–32)
CREATININE: 1.21 mg/dL (ref 0.61–1.24)
Chloride: 104 mmol/L (ref 101–111)
Glucose, Bld: 227 mg/dL — ABNORMAL HIGH (ref 65–99)
Potassium: 4.2 mmol/L (ref 3.5–5.1)
SODIUM: 136 mmol/L (ref 135–145)

## 2017-04-25 LAB — I-STAT TROPONIN, ED: TROPONIN I, POC: 0 ng/mL (ref 0.00–0.08)

## 2017-04-25 LAB — CBC
HCT: 39.7 % (ref 39.0–52.0)
Hemoglobin: 13.2 g/dL (ref 13.0–17.0)
MCH: 28 pg (ref 26.0–34.0)
MCHC: 33.2 g/dL (ref 30.0–36.0)
MCV: 84.3 fL (ref 78.0–100.0)
PLATELETS: 259 10*3/uL (ref 150–400)
RBC: 4.71 MIL/uL (ref 4.22–5.81)
RDW: 14 % (ref 11.5–15.5)
WBC: 10.7 10*3/uL — AB (ref 4.0–10.5)

## 2017-04-25 MED ORDER — METHOCARBAMOL 500 MG PO TABS: 500.0000 mg | ORAL_TABLET | Freq: Two times a day (BID) | ORAL | 0 refills | Status: DC

## 2017-04-25 MED ORDER — DIAZEPAM 5 MG PO TABS
5.0000 mg | ORAL_TABLET | Freq: Once | ORAL | Status: AC
  Administered 2017-04-25: 5 mg via ORAL
  Filled 2017-04-25: qty 1

## 2017-04-25 NOTE — ED Notes (Signed)
Patient transported to X-ray 

## 2017-04-25 NOTE — ED Provider Notes (Signed)
[facility] DEPT Provider Note   CSN: [id number] Arrival date & time: 04/25/17  1962     History   Chief Complaint Chief Complaint  Patient presents with  . Chest Pain  . Foot Pain    left    HPI [name] is a 47 y.o. male.  47 year old male presents with 2 days of constant left-sided chest pain characterized as sharp and worse with movement.  Has had some cough and congestion but denies any dyspnea or diaphoresis.  Pain is also worse when he moves his left arm.  Denies any syncope or near syncope.  No fever or chills.  Has a secondary complaint of long-standing left foot pain worse at the lateral aspect of it as well as worse with standing.  Patient had his left great toe amputation and has had pain since then.  Has been using over-the-counter medications without relief.      Past Medical History:  Diagnosis Date  . Diabetes mellitus type II, uncontrolled ([place])   . Hyperlipidemia   . Hypertension   . Other and unspecified angina pectoris   . Tobacco use     Patient Active Problem List   Diagnosis Date Noted  . Vitreous floaters of right eye 09/15/2016  . Nonadherence to medication 09/15/2016  . Depression 09/01/2016  . Foot ulcer due to secondary DM ([place]) 08/20/2016  . Coronary atherosclerosis of native coronary artery 08/17/2013  . Diabetic retinopathy ([place]) 02/02/2013  . Diabetic neuropathy ([place]) 12/01/2012  . Hypertension 08/03/2012  . Tobacco use disorder 08/03/2012  . Erectile dysfunction 08/03/2012  . Diabetes mellitus with neurological manifestations, uncontrolled ([place]) 05/26/2006    Past Surgical History:  Procedure Laterality Date  . AMPUTATION Left 08/22/2016   Procedure: GREAT TOE AMPUTATION;  Surgeon: [name], MD;  Location: [facility];  Service: Orthopedics;  Laterality: Left;  . LEFT HEART CATHETERIZATION WITH CORONARY ANGIOGRAM N/A 07/31/2013   Procedure: LEFT HEART CATHETERIZATION WITH CORONARY ANGIOGRAM;   Surgeon: Jettie Booze, MD;  Location: [facility] CATH LAB;  Service: Cardiovascular;  Laterality: N/A;  . spinal tap         Home Medications    Prior to Admission medications   Medication Sig Start Date End Date Taking? Authorizing Provider  amLODipine (NORVASC) 10 MG tablet Take 1 tablet (10 mg total) by mouth daily. 09/01/16 11/30/16  Minus Liberty, MD  aspirin EC 81 MG tablet Take 81 mg by mouth daily. 02/11/16   [provider]  atorvastatin (LIPITOR) 20 MG tablet Take 1 tablet (20 mg total) by mouth daily. 08/23/16 11/21/16  Minus Liberty, MD  blood glucose meter kit and supplies KIT Dispense based on patient and insurance preference. Use up to four times daily as directed. (FOR ICD-9 250.00, 250.01). 08/25/16   Minus Liberty, MD  citalopram (CELEXA) 20 MG tablet Take 1 tablet (20 mg total) by mouth daily. 09/01/16 09/01/17  Minus Liberty, MD  Cyanocobalamin (VITAMIN B-12 PO) Take 1 capsule by mouth daily.    [provider]  gabapentin (NEURONTIN) 300 MG capsule Take 1 capsule (300 mg total) by mouth 3 (three) times daily. 08/23/16 11/21/16  Minus Liberty, MD  ibuprofen (ADVIL,MOTRIN) 600 MG tablet Take 1 tablet (600 mg total) by mouth every 6 (six) hours as needed. 12/27/16   Noemi Chapel, MD  insulin detemir (LEVEMIR) 100 UNIT/ML injection Inject 0.1 mLs (10 Units total) into the skin at bedtime. 08/25/16 11/23/16  Minus Liberty, MD  insulin lispro (HUMALOG) 100  UNIT/ML injection Inject 0.1 mLs (10 Units total) into the skin daily with supper. 08/24/16 11/22/16  Minus Liberty, MD  Insulin Syringe-Needle U-100 (INSULIN SYRINGE .5CC/31GX5/16") 31G X 5/16" 0.5 ML MISC 1 each by Does not apply route as needed. 08/24/16   Minus Liberty, MD  lisinopril (PRINIVIL,ZESTRIL) 20 MG tablet Take 1 tablet (20 mg total) by mouth daily. 08/23/16 11/21/16  Minus Liberty, MD  metFORMIN (GLUCOPHAGE) 1000 MG tablet Take 0.5 tablets (500 mg total) by  mouth 2 (two) times daily with a meal. 09/01/16 11/30/16  Minus Liberty, MD  metoprolol tartrate (LOPRESSOR) 25 MG tablet Take 1 tablet (25 mg total) by mouth 2 (two) times daily. 08/23/16 11/21/16  Minus Liberty, MD  nicotine (NICODERM CQ - DOSED IN MG/24 HOURS) 21 mg/24hr patch Place 1 patch (21 mg total) onto the skin daily. 08/30/16   Suzan Slick, NP  nitroGLYCERIN (NITROSTAT) 0.4 MG SL tablet Place 1 tablet (0.4 mg total) under the tongue every 5 (five) minutes as needed for chest pain. 07/26/13   Jettie Booze, MD  ondansetron (ZOFRAN) 4 MG tablet Take 1 tablet (4 mg total) by mouth every 6 (six) hours as needed for nausea. 08/25/16   Minus Liberty, MD  senna-docusate (SENOKOT-S) 8.6-50 MG tablet While taking oxycodone, take 1-2 pills twice daily as needed to have a bowel movement daily. 08/23/16   Minus Liberty, MD  sildenafil (REVATIO) 20 MG tablet Take 20 mg by mouth daily as needed for erectile dysfunction. 07/07/15   [provider]    Family History Family History  Problem Relation Age of Onset  . Cancer Mother   . Diabetes Father   . Hypertension Father     Social History Social History   Tobacco Use  . Smoking status: Current Every Day Smoker    Packs/day: 1.00    Years: 20.00    Pack years: 20.00    Types: Cigarettes  . Smokeless tobacco: Never Used  . Tobacco comment: wants patches   Substance Use Topics  . Alcohol use: No    Comment: occ  . Drug use: No     Allergies   Bee venom and Penicillins   Review of Systems Review of Systems  All other systems reviewed and are negative.    Physical Exam Updated Vital Signs BP (!) 176/138 (BP Location: Left Arm) Comment: pt states they take BP medication but has not taken it this morning.  Pulse (!) 104   Temp 98.6 F (37 C) (Oral)   Resp (!) 22   SpO2 100%   Physical Exam  Constitutional: He is oriented to person, place, and time. He appears well-developed and  well-nourished.  Non-toxic appearance. No distress.  HENT:  Head: Normocephalic and atraumatic.  Eyes: Conjunctivae, EOM and lids are normal. Pupils are equal, round, and reactive to light.  Neck: Normal range of motion. Neck supple. No tracheal deviation present. No thyroid mass present.  Cardiovascular: Normal rate, regular rhythm and normal heart sounds. Exam reveals no gallop.  No murmur heard. Pulmonary/Chest: Effort normal and breath sounds normal. No stridor. No respiratory distress. He has no decreased breath sounds. He has no wheezes. He has no rhonchi. He has no rales.    Abdominal: Soft. Normal appearance and bowel sounds are normal. He exhibits no distension. There is no tenderness. There is no rebound and no CVA tenderness.  Musculoskeletal: Normal range of motion. He exhibits no edema or tenderness.       Feet:  Neurological:  He is alert and oriented to person, place, and time. He has normal strength. No cranial nerve deficit or sensory deficit. GCS eye subscore is 4. GCS verbal subscore is 5. GCS motor subscore is 6.  Skin: Skin is warm and dry. No abrasion and no rash noted.  Psychiatric: He has a normal mood and affect. His speech is normal and behavior is normal.  Nursing note and vitals reviewed.    ED Treatments / Results  Labs (all labs ordered are listed, but only abnormal results are displayed) Labs Reviewed  BASIC METABOLIC PANEL  CBC  I-STAT TROPONIN, ED    EKG  EKG Interpretation  Date/Time:  Monday April 25 2017 08:44:16 EST Ventricular Rate:  103 PR Interval:    QRS Duration: 87 QT Interval:  335 QTC Calculation: 439 R Axis:   83 Text Interpretation:  Sinus tachycardia Right atrial enlargement Confirmed by Lacretia Leigh ([id number]) on 04/25/2017 9:03:36 AM       Radiology No results found.  Procedures Procedures (including critical care time)  Medications Ordered in ED Medications  diazepam (VALIUM) tablet 5 mg (not administered)      Initial Impression / Assessment and Plan / ED Course  I have reviewed the triage vital signs and the nursing notes.  Pertinent labs & imaging results that were available during my care of the patient were reviewed by me and considered in my medical decision making (see chart for details).     Patient with 2 days of persistent chest pain that is reproducible.  Troponin negative.  Chest x-ray without acute findings EKG essentially unchanged.  Suspect musculoskeletal etiology treated with Valium and he feels better.  Will prescribe muscle relaxants.  Patient left foot discomfort is likely from his prior great toe amputation.  Was instructed to follow-up with his doctor for this.  Final Clinical Impressions(s) / ED Diagnoses   Final diagnoses:  None    ED Discharge Orders    None       Lacretia Leigh, MD 04/25/17 1034

## 2017-04-25 NOTE — ED Triage Notes (Signed)
Pt complains of chest pain and shortness of breath or the past couple days, left foot pain since having a toe amputated 6 months ago.

## 2017-05-10 ENCOUNTER — Telehealth: Payer: Self-pay | Admitting: General Practice

## 2017-05-10 NOTE — Telephone Encounter (Signed)
Copied from [place] [id number]. 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

## 2017-05-11 NOTE — Telephone Encounter (Signed)
Ok Thx 

## 2017-05-11 NOTE — Telephone Encounter (Signed)
LVM to inform patient Dr Alain Marion accepted him has a patient. To please call the office back and set up a new patient appointment with him.    When making the appointment, change the type to a OV and in the comments put NEW PATIENT. - We will have a manger override it later. Thank you.

## 2017-05-16 NOTE — Telephone Encounter (Signed)
Patient has made an appointment for 05/31/17.

## 2017-05-25 ENCOUNTER — Encounter (HOSPITAL_COMMUNITY): Payer: Self-pay | Admitting: *Deleted

## 2017-05-25 ENCOUNTER — Inpatient Hospital Stay (HOSPITAL_COMMUNITY)
Admission: EM | Admit: 2017-05-25 | Discharge: 2017-05-29 | DRG: 617 | Disposition: A | Discharge status: Home Health | Payer: BLUE CROSS/BLUE SHIELD | Attending: Oncology | Admitting: Oncology

## 2017-05-25 ENCOUNTER — Emergency Department (HOSPITAL_COMMUNITY): Payer: BLUE CROSS/BLUE SHIELD

## 2017-05-25 ENCOUNTER — Other Ambulatory Visit: Payer: Self-pay

## 2017-05-25 DIAGNOSIS — E785 Hyperlipidemia, unspecified: Secondary | ICD-10-CM | POA: Diagnosis present

## 2017-05-25 DIAGNOSIS — E11628 Type 2 diabetes mellitus with other skin complications: Secondary | ICD-10-CM | POA: Diagnosis present

## 2017-05-25 DIAGNOSIS — Z794 Long term (current) use of insulin: Secondary | ICD-10-CM

## 2017-05-25 DIAGNOSIS — E1169 Type 2 diabetes mellitus with other specified complication: Secondary | ICD-10-CM | POA: Diagnosis present

## 2017-05-25 DIAGNOSIS — Z9119 Patient's noncompliance with other medical treatment and regimen: Secondary | ICD-10-CM

## 2017-05-25 DIAGNOSIS — F17210 Nicotine dependence, cigarettes, uncomplicated: Secondary | ICD-10-CM | POA: Diagnosis present

## 2017-05-25 DIAGNOSIS — Z9114 Patient's other noncompliance with medication regimen: Secondary | ICD-10-CM | POA: Diagnosis not present

## 2017-05-25 DIAGNOSIS — L97519 Non-pressure chronic ulcer of other part of right foot with unspecified severity: Secondary | ICD-10-CM | POA: Diagnosis present

## 2017-05-25 DIAGNOSIS — Z599 Problem related to housing and economic circumstances, unspecified: Secondary | ICD-10-CM

## 2017-05-25 DIAGNOSIS — E1149 Type 2 diabetes mellitus with other diabetic neurological complication: Secondary | ICD-10-CM | POA: Diagnosis present

## 2017-05-25 DIAGNOSIS — Z809 Family history of malignant neoplasm, unspecified: Secondary | ICD-10-CM

## 2017-05-25 DIAGNOSIS — I2510 Atherosclerotic heart disease of native coronary artery without angina pectoris: Secondary | ICD-10-CM | POA: Diagnosis present

## 2017-05-25 DIAGNOSIS — M86272 Subacute osteomyelitis, left ankle and foot: Secondary | ICD-10-CM | POA: Diagnosis present

## 2017-05-25 DIAGNOSIS — E11319 Type 2 diabetes mellitus with unspecified diabetic retinopathy without macular edema: Secondary | ICD-10-CM | POA: Diagnosis present

## 2017-05-25 DIAGNOSIS — E1165 Type 2 diabetes mellitus with hyperglycemia: Secondary | ICD-10-CM | POA: Diagnosis not present

## 2017-05-25 DIAGNOSIS — M869 Osteomyelitis, unspecified: Secondary | ICD-10-CM | POA: Diagnosis present

## 2017-05-25 DIAGNOSIS — Z79899 Other long term (current) drug therapy: Secondary | ICD-10-CM | POA: Diagnosis not present

## 2017-05-25 DIAGNOSIS — E1140 Type 2 diabetes mellitus with diabetic neuropathy, unspecified: Secondary | ICD-10-CM | POA: Diagnosis present

## 2017-05-25 DIAGNOSIS — E11621 Type 2 diabetes mellitus with foot ulcer: Secondary | ICD-10-CM | POA: Diagnosis present

## 2017-05-25 DIAGNOSIS — E1152 Type 2 diabetes mellitus with diabetic peripheral angiopathy with gangrene: Secondary | ICD-10-CM | POA: Diagnosis not present

## 2017-05-25 DIAGNOSIS — I10 Essential (primary) hypertension: Secondary | ICD-10-CM | POA: Diagnosis present

## 2017-05-25 DIAGNOSIS — M25511 Pain in right shoulder: Secondary | ICD-10-CM | POA: Diagnosis not present

## 2017-05-25 DIAGNOSIS — L089 Local infection of the skin and subcutaneous tissue, unspecified: Secondary | ICD-10-CM

## 2017-05-25 DIAGNOSIS — E1159 Type 2 diabetes mellitus with other circulatory complications: Secondary | ICD-10-CM | POA: Diagnosis present

## 2017-05-25 DIAGNOSIS — F329 Major depressive disorder, single episode, unspecified: Secondary | ICD-10-CM | POA: Diagnosis present

## 2017-05-25 DIAGNOSIS — Z880 Allergy status to penicillin: Secondary | ICD-10-CM | POA: Diagnosis not present

## 2017-05-25 DIAGNOSIS — IMO0002 Reserved for concepts with insufficient information to code with codable children: Secondary | ICD-10-CM | POA: Diagnosis present

## 2017-05-25 DIAGNOSIS — Z7982 Long term (current) use of aspirin: Secondary | ICD-10-CM

## 2017-05-25 DIAGNOSIS — Z8249 Family history of ischemic heart disease and other diseases of the circulatory system: Secondary | ICD-10-CM

## 2017-05-25 DIAGNOSIS — Z89412 Acquired absence of left great toe: Secondary | ICD-10-CM | POA: Diagnosis not present

## 2017-05-25 DIAGNOSIS — I96 Gangrene, not elsewhere classified: Secondary | ICD-10-CM | POA: Diagnosis not present

## 2017-05-25 DIAGNOSIS — L02611 Cutaneous abscess of right foot: Secondary | ICD-10-CM | POA: Diagnosis present

## 2017-05-25 DIAGNOSIS — M75101 Unspecified rotator cuff tear or rupture of right shoulder, not specified as traumatic: Secondary | ICD-10-CM

## 2017-05-25 DIAGNOSIS — N179 Acute kidney failure, unspecified: Secondary | ICD-10-CM | POA: Diagnosis not present

## 2017-05-25 DIAGNOSIS — Z7984 Long term (current) use of oral hypoglycemic drugs: Secondary | ICD-10-CM | POA: Diagnosis not present

## 2017-05-25 DIAGNOSIS — K219 Gastro-esophageal reflux disease without esophagitis: Secondary | ICD-10-CM | POA: Diagnosis present

## 2017-05-25 DIAGNOSIS — M86371 Chronic multifocal osteomyelitis, right ankle and foot: Secondary | ICD-10-CM | POA: Diagnosis not present

## 2017-05-25 DIAGNOSIS — Z91030 Bee allergy status: Secondary | ICD-10-CM | POA: Diagnosis not present

## 2017-05-25 DIAGNOSIS — L84 Corns and callosities: Secondary | ICD-10-CM | POA: Diagnosis not present

## 2017-05-25 DIAGNOSIS — M86171 Other acute osteomyelitis, right ankle and foot: Secondary | ICD-10-CM | POA: Diagnosis not present

## 2017-05-25 DIAGNOSIS — R739 Hyperglycemia, unspecified: Secondary | ICD-10-CM

## 2017-05-25 DIAGNOSIS — Z833 Family history of diabetes mellitus: Secondary | ICD-10-CM | POA: Diagnosis not present

## 2017-05-25 DIAGNOSIS — Z89411 Acquired absence of right great toe: Secondary | ICD-10-CM | POA: Diagnosis not present

## 2017-05-25 DIAGNOSIS — Z89421 Acquired absence of other right toe(s): Secondary | ICD-10-CM | POA: Diagnosis not present

## 2017-05-25 DIAGNOSIS — Z91120 Patient's intentional underdosing of medication regimen due to financial hardship: Secondary | ICD-10-CM | POA: Diagnosis not present

## 2017-05-25 DIAGNOSIS — F17200 Nicotine dependence, unspecified, uncomplicated: Secondary | ICD-10-CM | POA: Diagnosis present

## 2017-05-25 DIAGNOSIS — K5900 Constipation, unspecified: Secondary | ICD-10-CM | POA: Diagnosis not present

## 2017-05-25 DIAGNOSIS — L97518 Non-pressure chronic ulcer of other part of right foot with other specified severity: Secondary | ICD-10-CM | POA: Diagnosis not present

## 2017-05-25 DIAGNOSIS — M25571 Pain in right ankle and joints of right foot: Secondary | ICD-10-CM | POA: Diagnosis present

## 2017-05-25 DIAGNOSIS — M7501 Adhesive capsulitis of right shoulder: Secondary | ICD-10-CM | POA: Diagnosis present

## 2017-05-25 LAB — CBC WITH DIFFERENTIAL/PLATELET
Basophils Absolute: 0 10*3/uL (ref 0.0–0.1)
Basophils Relative: 0 %
EOS PCT: 1 %
Eosinophils Absolute: 0.1 10*3/uL (ref 0.0–0.7)
HCT: 36.2 % — ABNORMAL LOW (ref 39.0–52.0)
HEMOGLOBIN: 11.4 g/dL — AB (ref 13.0–17.0)
LYMPHS ABS: 2.3 10*3/uL (ref 0.7–4.0)
LYMPHS PCT: 20 %
MCH: 27.1 pg (ref 26.0–34.0)
MCHC: 31.5 g/dL (ref 30.0–36.0)
MCV: 86 fL (ref 78.0–100.0)
MONOS PCT: 7 %
Monocytes Absolute: 0.8 10*3/uL (ref 0.1–1.0)
Neutro Abs: 8.5 10*3/uL — ABNORMAL HIGH (ref 1.7–7.7)
Neutrophils Relative %: 72 %
Platelets: 347 10*3/uL (ref 150–400)
RBC: 4.21 MIL/uL — AB (ref 4.22–5.81)
RDW: 14 % (ref 11.5–15.5)
WBC: 11.7 10*3/uL — AB (ref 4.0–10.5)

## 2017-05-25 LAB — URINALYSIS, ROUTINE W REFLEX MICROSCOPIC
BILIRUBIN URINE: NEGATIVE
Glucose, UA: 150 mg/dL — AB
Ketones, ur: NEGATIVE mg/dL
Leukocytes, UA: NEGATIVE
Nitrite: NEGATIVE
Specific Gravity, Urine: 1.025 (ref 1.005–1.030)
pH: 5 (ref 5.0–8.0)

## 2017-05-25 LAB — CBG MONITORING, ED: Glucose-Capillary: 123 mg/dL — ABNORMAL HIGH (ref 65–99)

## 2017-05-25 LAB — GLUCOSE, CAPILLARY: Glucose-Capillary: 181 mg/dL — ABNORMAL HIGH (ref 65–99)

## 2017-05-25 LAB — COMPREHENSIVE METABOLIC PANEL
ALBUMIN: 2.8 g/dL — AB (ref 3.5–5.0)
ALT: 14 U/L — AB (ref 17–63)
AST: 14 U/L — AB (ref 15–41)
Alkaline Phosphatase: 92 U/L (ref 38–126)
Anion gap: 10 (ref 5–15)
BUN: 13 mg/dL (ref 6–20)
CO2: 22 mmol/L (ref 22–32)
Calcium: 9.1 mg/dL (ref 8.9–10.3)
Chloride: 105 mmol/L (ref 101–111)
Creatinine, Ser: 1.28 mg/dL — ABNORMAL HIGH (ref 0.61–1.24)
GFR calc Af Amer: >60 mL/min (ref >60)
Glucose, Bld: 203 mg/dL — ABNORMAL HIGH (ref 65–99)
POTASSIUM: 3.9 mmol/L (ref 3.5–5.1)
SODIUM: 137 mmol/L (ref 135–145)
Total Bilirubin: 0.5 mg/dL (ref 0.3–1.2)
Total Protein: 7.3 g/dL (ref 6.5–8.1)

## 2017-05-25 LAB — I-STAT CG4 LACTIC ACID, ED
LACTIC ACID, VENOUS: 0.74 mmol/L (ref 0.5–1.9)
LACTIC ACID, VENOUS: 0.72 mmol/L (ref 0.5–1.9)

## 2017-05-25 LAB — MRSA PCR SCREENING: MRSA by PCR: NEGATIVE

## 2017-05-25 MED ORDER — SENNOSIDES-DOCUSATE SODIUM 8.6-50 MG PO TABS: 1.0000 | ORAL_TABLET | Freq: Two times a day (BID) | ORAL | Status: DC | PRN

## 2017-05-25 MED ORDER — TETANUS-DIPHTH-ACELL PERTUSSIS 5-2.5-18.5 LF-MCG/0.5 IM SUSP
0.5000 mL | Freq: Once | INTRAMUSCULAR | Status: DC
  Filled 2017-05-25: qty 0.5

## 2017-05-25 MED ORDER — ACETAMINOPHEN 325 MG PO TABS
650.0000 mg | ORAL_TABLET | Freq: Four times a day (QID) | ORAL | Status: DC | PRN
  Administered 2017-05-25: 650 mg via ORAL
  Filled 2017-05-25: qty 2

## 2017-05-25 MED ORDER — HYDROMORPHONE HCL 1 MG/ML IJ SOLN
0.5000 mg | Freq: Once | INTRAMUSCULAR | Status: AC
  Administered 2017-05-25: 0.5 mg via INTRAVENOUS
  Filled 2017-05-25: qty 1

## 2017-05-25 MED ORDER — OXYCODONE HCL 5 MG PO TABS
5.0000 mg | ORAL_TABLET | ORAL | Status: DC | PRN
  Administered 2017-05-25 – 2017-05-28 (×5): 5 mg via ORAL
  Filled 2017-05-25 (×5): qty 1

## 2017-05-25 MED ORDER — ATORVASTATIN CALCIUM 20 MG PO TABS
20.0000 mg | ORAL_TABLET | Freq: Every day | ORAL | Status: DC
  Administered 2017-05-26 – 2017-05-29 (×3): 20 mg via ORAL
  Filled 2017-05-25 (×3): qty 1

## 2017-05-25 MED ORDER — AMLODIPINE BESYLATE 10 MG PO TABS
10.0000 mg | ORAL_TABLET | Freq: Every day | ORAL | Status: DC
  Administered 2017-05-25 – 2017-05-29 (×4): 10 mg via ORAL
  Filled 2017-05-25: qty 1
  Filled 2017-05-25: qty 2
  Filled 2017-05-25 (×2): qty 1

## 2017-05-25 MED ORDER — LISINOPRIL 20 MG PO TABS
20.0000 mg | ORAL_TABLET | Freq: Every day | ORAL | Status: DC
  Administered 2017-05-25 – 2017-05-29 (×4): 20 mg via ORAL
  Filled 2017-05-25 (×4): qty 1

## 2017-05-25 MED ORDER — CLINDAMYCIN PHOSPHATE 600 MG/50ML IV SOLN
600.0000 mg | Freq: Once | INTRAVENOUS | Status: AC
  Administered 2017-05-25: 600 mg via INTRAVENOUS
  Filled 2017-05-25: qty 50

## 2017-05-25 MED ORDER — ACETAMINOPHEN 650 MG RE SUPP: 650.0000 mg | Freq: Four times a day (QID) | RECTAL | Status: DC | PRN

## 2017-05-25 MED ORDER — INSULIN ASPART 100 UNIT/ML ~~LOC~~ SOLN
0.0000 [IU] | SUBCUTANEOUS | Status: DC
  Administered 2017-05-25 – 2017-05-26 (×3): 3 [IU] via SUBCUTANEOUS
  Administered 2017-05-26: 5 [IU] via SUBCUTANEOUS
  Administered 2017-05-27 (×4): 3 [IU] via SUBCUTANEOUS
  Administered 2017-05-27: 2 [IU] via SUBCUTANEOUS
  Administered 2017-05-28: 5 [IU] via SUBCUTANEOUS
  Administered 2017-05-28: 2 [IU] via SUBCUTANEOUS

## 2017-05-25 MED ORDER — VANCOMYCIN HCL IN DEXTROSE 1-5 GM/200ML-% IV SOLN
1000.0000 mg | Freq: Two times a day (BID) | INTRAVENOUS | Status: AC
  Administered 2017-05-26 – 2017-05-29 (×8): 1000 mg via INTRAVENOUS
  Filled 2017-05-25 (×8): qty 200

## 2017-05-25 MED ORDER — METOPROLOL TARTRATE 25 MG PO TABS
25.0000 mg | ORAL_TABLET | Freq: Two times a day (BID) | ORAL | Status: DC
  Administered 2017-05-25 – 2017-05-29 (×9): 25 mg via ORAL
  Filled 2017-05-25 (×9): qty 1

## 2017-05-25 MED ORDER — POLYETHYLENE GLYCOL 3350 17 G PO PACK: 17.0000 g | PACK | Freq: Every day | ORAL | Status: DC | PRN

## 2017-05-25 MED ORDER — VANCOMYCIN HCL IN DEXTROSE 1-5 GM/200ML-% IV SOLN
1000.0000 mg | Freq: Once | INTRAVENOUS | Status: AC
Start: 2017-05-25 — End: 2017-05-25
  Administered 2017-05-25: 1000 mg via INTRAVENOUS
  Filled 2017-05-25: qty 200

## 2017-05-25 MED ORDER — CIPROFLOXACIN IN D5W 400 MG/200ML IV SOLN
400.0000 mg | Freq: Once | INTRAVENOUS | Status: DC
  Filled 2017-05-25: qty 200

## 2017-05-25 MED ORDER — SODIUM CHLORIDE 0.9 % IV SOLN
1.0000 g | Freq: Three times a day (TID) | INTRAVENOUS | Status: DC
  Administered 2017-05-25 – 2017-05-29 (×12): 1 g via INTRAVENOUS
  Filled 2017-05-25 (×14): qty 1

## 2017-05-25 MED ORDER — SODIUM CHLORIDE 0.9 % IV SOLN: INTRAVENOUS | Status: DC

## 2017-05-25 NOTE — ED Provider Notes (Signed)
[facility] EMERGENCY DEPARTMENT Provider Note   CSN: [id number] Arrival date & time: 05/25/17  0815     History   Chief Complaint Chief Complaint  Patient presents with  . Toe Pain  . Shoulder Pain    HPI   Blood pressure (!) 183/112, pulse (!) 111, temperature 98.6 F (37 C), temperature source Oral, resp. rate 17, height _0  (1.626 m), weight 85.7 kg (189 lb), SpO2 100 %.  [name] is a 47 y.o. male with past medical history significant for insulin-dependent type 2 diabetes (noncompliant with all medications) hypertension and hyperlipidemia complaining of pain, swelling, foul smell to right great toe worsening over the course of the month.  He denies fevers or chills but he is worried that it may need to be amputated like the left great toe.  He states he has been cleaning and changing the dressing once or twice a day and applying [place] to the area to cover the smell.  He is also reporting a right shoulder pain with reduced range of motion, patient works in the food services in the hospital.  He is right-hand dominant.  There was chart review shows triage note reporting chest pain or shortness of breath however when I discussed this with the patient he adamantly denies this.  Past Medical History:  Diagnosis Date  . Diabetes mellitus type II, uncontrolled ([place])   . Hyperlipidemia   . Hypertension   . Other and unspecified angina pectoris   . Tobacco use     Patient Active Problem List   Diagnosis Date Noted  . Osteomyelitis ([place]) 05/25/2017  . Vitreous floaters of right eye 09/15/2016  . Nonadherence to medication 09/15/2016  . Depression 09/01/2016  . Foot ulcer due to secondary DM ([place]) 08/20/2016  . Coronary atherosclerosis of native coronary artery 08/17/2013  . Diabetic retinopathy ([place]) 02/02/2013  . Diabetic neuropathy ([place]) 12/01/2012  . Hypertension 08/03/2012  . Tobacco use disorder 08/03/2012  . Erectile dysfunction 08/03/2012    . Diabetes mellitus with neurological manifestations, uncontrolled ([place]) 05/26/2006    Past Surgical History:  Procedure Laterality Date  . AMPUTATION Left 08/22/2016   Procedure: GREAT TOE AMPUTATION;  Surgeon: [name], MD;  Location: [facility];  Service: Orthopedics;  Laterality: Left;  . LEFT HEART CATHETERIZATION WITH CORONARY ANGIOGRAM N/A 07/31/2013   Procedure: LEFT HEART CATHETERIZATION WITH CORONARY ANGIOGRAM;  Surgeon: Jettie Booze, MD;  Location: [facility] CATH LAB;  Service: Cardiovascular;  Laterality: N/A;  . spinal tap    . toe amputated         Home Medications    Prior to Admission medications   Medication Sig Start Date End Date Taking? Authorizing Provider  amLODipine (NORVASC) 10 MG tablet Take 1 tablet (10 mg total) by mouth daily. 09/01/16 05/25/17 Yes Minus Liberty, MD  aspirin EC 81 MG tablet Take 81 mg by mouth daily. 02/11/16  Yes [provider]  atorvastatin (LIPITOR) 20 MG tablet Take 1 tablet (20 mg total) by mouth daily. 08/23/16 05/25/17 Yes Minus Liberty, MD  citalopram (CELEXA) 20 MG tablet Take 1 tablet (20 mg total) by mouth daily. 09/01/16 09/01/17 Yes Minus Liberty, MD  Cyanocobalamin (VITAMIN B-12 PO) Take 1 capsule by mouth daily.   Yes [provider]  gabapentin (NEURONTIN) 300 MG capsule Take 1 capsule (300 mg total) by mouth 3 (three) times daily. 08/23/16 05/25/17 Yes Minus Liberty, MD  ibuprofen (ADVIL,MOTRIN) 200 MG tablet Take 600 mg by mouth every  6 (six) hours as needed for moderate pain.   Yes [provider]  insulin detemir (LEVEMIR) 100 UNIT/ML injection Inject 0.1 mLs (10 Units total) into the skin at bedtime. 08/25/16 05/25/17 Yes Minus Liberty, MD  insulin lispro (HUMALOG) 100 UNIT/ML injection Inject 0.1 mLs (10 Units total) into the skin daily with supper. Patient taking differently: Inject 15 Units into the skin daily with supper.  08/24/16 05/25/17 Yes Minus Liberty, MD   lisinopril (PRINIVIL,ZESTRIL) 20 MG tablet Take 1 tablet (20 mg total) by mouth daily. 08/23/16 05/25/17 Yes Minus Liberty, MD  metFORMIN (GLUCOPHAGE) 1000 MG tablet Take 0.5 tablets (500 mg total) by mouth 2 (two) times daily with a meal. 09/01/16 05/25/17 Yes Minus Liberty, MD  methocarbamol (ROBAXIN) 500 MG tablet Take 1 tablet (500 mg total) by mouth 2 (two) times daily. 04/25/17  Yes Lacretia Leigh, MD  metoprolol tartrate (LOPRESSOR) 25 MG tablet Take 1 tablet (25 mg total) by mouth 2 (two) times daily. 08/23/16 05/25/17 Yes Minus Liberty, MD  nitroGLYCERIN (NITROSTAT) 0.4 MG SL tablet Place 1 tablet (0.4 mg total) under the tongue every 5 (five) minutes as needed for chest pain. 07/26/13  Yes Jettie Booze, MD  senna-docusate (SENOKOT-S) 8.6-50 MG tablet While taking oxycodone, take 1-2 pills twice daily as needed to have a bowel movement daily. 08/23/16  Yes Minus Liberty, MD  sildenafil (REVATIO) 20 MG tablet Take 20 mg by mouth daily as needed for erectile dysfunction. 07/07/15  Yes [provider]  blood glucose meter kit and supplies KIT Dispense based on patient and insurance preference. Use up to four times daily as directed. (FOR ICD-9 250.00, 250.01). 08/25/16   Minus Liberty, MD  Insulin Syringe-Needle U-100 (INSULIN SYRINGE .5CC/31GX5/16") 31G X 5/16" 0.5 ML MISC 1 each by Does not apply route as needed. 08/24/16   Minus Liberty, MD  nicotine (NICODERM CQ - DOSED IN MG/24 HOURS) 21 mg/24hr patch Place 1 patch (21 mg total) onto the skin daily. Patient not taking: Reported on 04/25/2017 08/30/16   Suzan Slick, NP    Family History Family History  Problem Relation Age of Onset  . Cancer Mother   . Diabetes Father   . Hypertension Father     Social History Social History   Tobacco Use  . Smoking status: Current Every Day Smoker    Packs/day: 1.00    Years: 20.00    Pack years: 20.00    Types: Cigarettes  . Smokeless tobacco: Never  Used  . Tobacco comment: wants patches   Substance Use Topics  . Alcohol use: No    Comment: occ  . Drug use: No     Allergies   Bee venom and Penicillins   Review of Systems Review of Systems  A complete review of systems was obtained and all systems are negative except as noted in the HPI and PMH.   Physical Exam Updated Vital Signs BP (!) 150/84   Pulse 87   Temp 98.6 F (37 C) (Oral)   Resp (!) 24   Ht _0  (1.626 m)   Wt 85.7 kg (189 lb)   SpO2 100%   BMI 32.44 kg/m   Physical Exam  Constitutional: He is oriented to person, place, and time. He appears well-developed and well-nourished. No distress.  HENT:  Head: Normocephalic and atraumatic.  Mouth/Throat: Oropharynx is clear and moist.  Eyes: Conjunctivae and EOM are normal. Pupils are equal, round, and reactive to light.  Neck: Normal range of motion.  Cardiovascular: Normal rate, regular rhythm and intact distal pulses.  Pulmonary/Chest: Effort normal and breath sounds normal.  Abdominal: Soft. There is no tenderness.  Musculoskeletal: Normal range of motion.       Arms: Neurological: He is alert and oriented to person, place, and time.  Skin: He is not diaphoretic.  Onychomycosis, swelling, foul smell to right great toe with laceration on the dorsal aspect, duskiness on the volar aspect.  No gross discharge.  Psychiatric: He has a normal mood and affect.  Nursing note and vitals reviewed.        ED Treatments / Results  Labs (all labs ordered are listed, but only abnormal results are displayed) Labs Reviewed  COMPREHENSIVE METABOLIC PANEL - Abnormal; Notable for the following components:      Result Value   Glucose, Bld 203 (*)    Creatinine, Ser 1.28 (*)    Albumin 2.8 (*)    AST 14 (*)    ALT 14 (*)    All other components within normal limits  CBC WITH DIFFERENTIAL/PLATELET - Abnormal; Notable for the following components:   WBC 11.7 (*)    RBC 4.21 (*)    Hemoglobin 11.4 (*)    HCT  36.2 (*)    Neutro Abs 8.5 (*)    All other components within normal limits  URINALYSIS, ROUTINE W REFLEX MICROSCOPIC - Abnormal; Notable for the following components:   APPearance HAZY (*)    Glucose, UA 150 (*)    Hgb urine dipstick SMALL (*)    Protein, ur >=300 (*)    Bacteria, UA RARE (*)    Squamous Epithelial / LPF 0-5 (*)    All other components within normal limits  CULTURE, BLOOD (ROUTINE X 2)  CULTURE, BLOOD (ROUTINE X 2)  MRSA PCR SCREENING  I-STAT CG4 LACTIC ACID, ED  I-STAT CG4 LACTIC ACID, ED    EKG  EKG Interpretation None       Radiology Dg Shoulder Right  Result Date: 05/25/2017 CLINICAL DATA:  Pain and decreased range of motion in the right shoulder for a few months. EXAM: RIGHT SHOULDER - 2+ VIEW COMPARISON:  04/22/2011 FINDINGS: Degenerative AC joint spurring that is directed superiorly. Chronic insertional type changes at the greater tuberosity. No glenohumeral narrowing or spurring. No fracture deformity or soft tissue calcification. IMPRESSION: 1. No acute finding or significant change from 2013. 2. AC joint spurring. Electronically Signed   By: Monte Fantasia M.D.   On: 05/25/2017 12:33   Dg Toe Great Right  Result Date: 05/25/2017 CLINICAL DATA:  Right great toe pain and wound. EXAM: RIGHT GREAT TOE COMPARISON:  Radiographs of December 02, 2013. FINDINGS: There is noted lytic destruction involving the distal tuft of the first distal phalanx consistent with osteomyelitis. Soft tissue gas is noted in the right first toe consistent with cellulitis. No radiopaque foreign body is noted. There is also noted lytic destruction involving the third distal phalanx with overlying soft tissue ulceration. IMPRESSION: Findings consistent with osteomyelitis involving first and third distal phalanges. Electronically Signed   By: Marijo Conception, M.D.   On: 05/25/2017 12:33    Procedures Procedures (including critical care time)  Medications Ordered in ED Medications    Tdap (BOOSTRIX) injection 0.5 mL (0.5 mLs Intramuscular Not Given 05/25/17 1321)  amLODipine (NORVASC) tablet 10 mg (not administered)  lisinopril (PRINIVIL,ZESTRIL) tablet 20 mg (not administered)  metoprolol tartrate (LOPRESSOR) tablet 25 mg (not administered)  HYDROmorphone (DILAUDID) injection 0.5 mg (0.5 mg Intravenous Given  05/25/17 1318)  vancomycin (VANCOCIN) IVPB 1000 mg/200 mL premix (1,000 mg Intravenous New Bag/Given 05/25/17 1324)  clindamycin (CLEOCIN) IVPB 600 mg (600 mg Intravenous New Bag/Given 05/25/17 1324)     Initial Impression / Assessment and Plan / ED Course  I have reviewed the triage vital signs and the nursing notes.  Pertinent labs & imaging results that were available during my care of the patient were reviewed by me and considered in my medical decision making (see chart for details).     Vitals:   05/25/17 1115 05/25/17 1130 05/25/17 1230 05/25/17 1245  BP: (!) 181/103 (!) 181/95 (!) 157/75 (!) 150/84  Pulse: 86 85 82 87  Resp: _0 (!) 24  Temp:      TempSrc:      SpO2: 100% 100% 100% 100%  Weight:      Height:        Medications  Tdap (BOOSTRIX) injection 0.5 mL (0.5 mLs Intramuscular Not Given 05/25/17 1321)  amLODipine (NORVASC) tablet 10 mg (not administered)  lisinopril (PRINIVIL,ZESTRIL) tablet 20 mg (not administered)  metoprolol tartrate (LOPRESSOR) tablet 25 mg (not administered)  HYDROmorphone (DILAUDID) injection 0.5 mg (0.5 mg Intravenous Given 05/25/17 1318)  vancomycin (VANCOCIN) IVPB 1000 mg/200 mL premix (1,000 mg Intravenous New Bag/Given 05/25/17 1324)  clindamycin (CLEOCIN) IVPB 600 mg (600 mg Intravenous New Bag/Given 05/25/17 1324)    [name] is 47 y.o. male presenting with diabetic foot wound, ulceration to right great toe.  He has been noncompliant with his insulin.  Foul smell from toe.  Blood work with a mild leukocytosis of 11.7.  Hyperglycemia without ketosis, mild elevation in creatinine to 1.28.  Patient is  initially quite hypertensive, also noncompliant with his hypertensive medications.  X-ray consistent with an osteomyelitis.  This should be covered by the vancomycin.  I will speak to orthopedics.  Admitted to internal medicine teaching service.  Final Clinical Impressions(s) / ED Diagnoses   Final diagnoses:  Osteomyelitis, unspecified site, unspecified type ([place])  Diabetic foot infection ([place])  Non compliance w medication regimen  Hyperglycemia without ketosis    ED Discharge Orders    None       Karen Kays Charna Elizabeth 05/25/17 1434    Quintella Reichert, MD 05/29/17 [phone]

## 2017-05-25 NOTE — H&P (Signed)
Date: 05/25/2017               Patient Name:  [name] MRN: [id number]  DOB: 12-19-1970 Age / Sex: 47 y.o., male   PCP: Patient, No Pcp Per         Medical Service: Internal Medicine Teaching Service         Attending Physician: Dr. Rebeca Alert, Raynaldo Opitz, MD    First Contact: Dr. Ronalee Red Pager: [phone]  Second Contact: Dr. Danford Bad Pager: [phone]       After Hours (After 5p/  First Contact Pager: [phone]  weekends / holidays): Second Contact Pager: [phone]   Chief Complaint: right toe pain  History of Present Illness:  [name] is a 47yo male with PMH of diabetes c/b osteomyelitis s/p L great toe amputation and HTN who presents with 1 month of right great toe pain.   He states that he is not quite sure what the initial wound looked like, however, he stated it started feeling "soft". He has taken alleve and motrin for the pain and has been using cologne to cover the odor. He endorses pain up to his mid-shin and that the pain has made it difficult for him to stand steady. He endorses intermittent numbness and tingling in his bilateral feet. He denies subjective fevers, chest pain, shortness of breath, dysuria, abdominal pain, N/V, polydipsia, or polyuria. He had his left great toe amputated approximately 7-8 months ago.  He does also complain of pain in his right arm for the last 2-3 days. Denies trauma to the area. Approximately 5-6 years ago, he injured his rotator cuff and had similar pain. The pain resolved after his physician gave him some pain medication. He endorses pain with active and passive range of motion and cannot lift his shoulder above 90 degrees.  He smokes 1/2 ppd, denies alcohol use or other illicit drugs. He states he was previously on diabetes and BP medications, but stopped taking all of his medications a few months ago because he ran out of the prescriptions. He was seen in our clinic in June 2018, but did not follow up. He states he previously did not have  insurance and thus was unable to afford appointments or medications. He has new insurance now with his job and states he has an appointment coming up to be seen as a new patient at [facility].  ED Course: - BP 183/112, HR 111, RR 17, temp 98.6, O2 100% on RA - CBC with WBC 11.7, Hb 11.4. Lactic acid 0.74. Cr 1.28 (baseline ~1-1.2), gluc 203. BCx x2 collected. UA with 150 glucose, small Hgb, 300 protein, rare bacteria, 0-5 squam epithelial cells - Right shoulder x-ray with AC joint spurring, no acute changes. Right great toe x-ray with osteomyelitis involving first and third distal phalanges. - Received IV dilaudid and clindamycin/vancomycin. Ortho consulted by EDP.  Meds:  Current Meds  Medication Sig  . amLODipine (NORVASC) 10 MG tablet Take 1 tablet (10 mg total) by mouth daily.  Marland Kitchen aspirin EC 81 MG tablet Take 81 mg by mouth daily.  Marland Kitchen atorvastatin (LIPITOR) 20 MG tablet Take 1 tablet (20 mg total) by mouth daily.  . citalopram (CELEXA) 20 MG tablet Take 1 tablet (20 mg total) by mouth daily.  . Cyanocobalamin (VITAMIN B-12 PO) Take 1 capsule by mouth daily.  Marland Kitchen gabapentin (NEURONTIN) 300 MG capsule Take 1 capsule (300 mg total) by mouth 3 (three) times daily.  Marland Kitchen ibuprofen (ADVIL,MOTRIN) 200 MG tablet  Take 600 mg by mouth every 6 (six) hours as needed for moderate pain.  Marland Kitchen insulin detemir (LEVEMIR) 100 UNIT/ML injection Inject 0.1 mLs (10 Units total) into the skin at bedtime.  . insulin lispro (HUMALOG) 100 UNIT/ML injection Inject 0.1 mLs (10 Units total) into the skin daily with supper. (Patient taking differently: Inject 15 Units into the skin daily with supper. )  . lisinopril (PRINIVIL,ZESTRIL) 20 MG tablet Take 1 tablet (20 mg total) by mouth daily.  . metFORMIN (GLUCOPHAGE) 1000 MG tablet Take 0.5 tablets (500 mg total) by mouth 2 (two) times daily with a meal.  . methocarbamol (ROBAXIN) 500 MG tablet Take 1 tablet (500 mg total) by mouth 2 (two) times daily.  . metoprolol tartrate  (LOPRESSOR) 25 MG tablet Take 1 tablet (25 mg total) by mouth 2 (two) times daily.  . nitroGLYCERIN (NITROSTAT) 0.4 MG SL tablet Place 1 tablet (0.4 mg total) under the tongue every 5 (five) minutes as needed for chest pain.  Marland Kitchen senna-docusate (SENOKOT-S) 8.6-50 MG tablet While taking oxycodone, take 1-2 pills twice daily as needed to have a bowel movement daily.  . sildenafil (REVATIO) 20 MG tablet Take 20 mg by mouth daily as needed for erectile dysfunction.   Allergies: Allergies as of 05/25/2017 - Review Complete 05/25/2017  Allergen Reaction Noted  . Bee venom Swelling 07/24/2012  . Penicillins Hives 04/01/2011   Past Medical History:  Diagnosis Date  . Diabetes mellitus type II, uncontrolled ([place])   . Hyperlipidemia   . Hypertension   . Other and unspecified angina pectoris   . Tobacco use    Family History:  - Father: diabetes Family History  Problem Relation Age of Onset  . Cancer Mother   . Diabetes Father   . Hypertension Father    Social History:  - smokes 1/2ppd - denies alcohol use or other illicit drugs - works at [employer] at [employer] - bee stings - PCN ("I break out")  Review of Systems: A complete ROS was negative except as per HPI.   Physical Exam: Blood pressure (!) 150/84, pulse 87, temperature 98.6 F (37 C), temperature source Oral, resp. rate (!) 24, height 5\' 4"  (1.626 m), weight 189 lb (85.7 kg), SpO2 100 %. GEN: Well-appearing, well-nourished young male. Alert and oriented. No acute distress.  HENT: [place]/AT. Moist mucous membranes. No visible lesions. EYES: PERRL. Sclera non-icteric. Conjunctiva clear. RESP: Clear to auscultation bilaterally. No wheezes, rales, or rhonchi. No increased work of breathing. CV: Normal rate and regular rhythm. No murmurs, gallops, or rubs. No LE edema. ABD: Soft. Non-tender. Non-distended. Normoactive bowel sounds. RIGHT SHOULDER: Decreased active and passive range of motion. TTP over anterior/superior  glenohumeral joint. 2+ radial pulse on R wrist. Normal sensation in BUE. EXT: No edema. Warm. S/p left great toe amputation, does not appear infected. Right great toe ulcerated, foul-smelling. Third toe with extension from the tip that extends below 2nd digit     NEURO: Cranial nerves II-XII grossly intact. Able to lift all four extremities against gravity. No apparent audiovisual hallucinations. Speech fluent and appropriate. PSYCH: Patient is calm and pleasant. Appropriate affect. Well-groomed; speech is appropriate and on-subject.  Labs CBC Latest Ref Rng & Units 05/25/2017 04/25/2017 12/27/2016  WBC 4.0 - 10.5 K/uL 11.7(H) 10.7(H) 10.7(H)  Hemoglobin 13.0 - 17.0 g/dL 11.4(L) 13.2 12.4(L)  Hematocrit 39.0 - 52.0 % 36.2(L) 39.7 38.7(L)  Platelets 150 - 400 K/uL 347 259 275   CMP Latest Ref Rng & Units 05/25/2017  04/25/2017 12/27/2016  Glucose 65 - 99 mg/dL 203(H) 227(H) 197(H)  BUN 6 - 20 mg/dL 13 16 15   Creatinine 0.61 - 1.24 mg/dL 1.28(H) 1.21 1.32(H)  Sodium 135 - 145 mmol/L 137 136 138  Potassium 3.5 - 5.1 mmol/L 3.9 4.2 4.2  Chloride 101 - 111 mmol/L 105 104 103  CO2 22 - 32 mmol/L 22 26 28   Calcium 8.9 - 10.3 mg/dL 9.1 9.0 9.2  Total Protein 6.5 - 8.1 g/dL 7.3 - -  Total Bilirubin 0.3 - 1.2 mg/dL 0.5 - -  Alkaline Phos 38 - 126 U/L 92 - -  AST 15 - 41 U/L 14(L) - -  ALT 17 - 63 U/L 14(L) - -   Lactic acid 0.74 -> 0.72 BCx pending UA with 150 glucose, small Hgb, >=300 protein, 0-5 WBC, rare bacteria, 0-5 squam epithelial cells, +granular casts, +mucus, +hyaline casts  Right toe x-ray Findings consistent with osteomyelitis involving first and third distal phalanges.  Right shoulder x-ray 1. No acute finding or significant change from 2013. 2. AC joint spurring.  Assessment & Plan by Problem: Active Problems:   Osteomyelitis [facility])  Mr. Sassano is a 47yo male with PMH of diabetes c/b osteomyelitis s/p L great toe amputation and HTN who presents with 1 month of right great  toe pain, found to have osteomyelitis of first and third distal phalanges on x-ray. S/p 1 dose of vancomycin and clindamycin. Ortho has been consulted.  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 Afebrile and HDS. Blood cultures drawn. Ortho has been consulted, will require amputation. Received vanc and clindamycin in ED. Will add polymicrobial coverage. - Ortho consulted; appreciate their assistance - Admit to med-surg - NPO - Holding lovenox for possible surgery - Oxycodone 5mg  q4h PRN for pain - Senna BID PRN for constipation - CBC, BMET in AM - F/u BCx 2/27 -> - Continue vancomycin - D/c clindamycin - Will add cefepime for polymicrobial coverage  Right shoulder pain For the last 2-3 days. Passive and active ROM limited secondary to pain. Denies trauma to the area. Neurovascularly intact. X-ray without acute changes compared to previous. Differential includes adhesive capsulitis vs septic arthritis - F/u BCx 2/27 -> - Antibiotics, as above - Consider U/S  - Tylenol PRN for mild pain - Oxycodone 5mg  q4h PRN for severe pain  Diabetes Stopped all medications a few months ago. A1c in 07/2016 was 9.8. Was previously on insulin and metformin - Hold home metformin - SSI-M q4h while NPO - CBG monitoring  HTN BP elevated at 183/112. Reports stopping all of his medications a few months ago. Will need better BP control prior to going to the OR. - Start home amlodipine 10mg  daily, home lisinopril 20mg  daily, and home metoprolol 25mg  BID  HLD Stopped all medications a few months ago - Start home atorvastatin 20mg  daily  Diet: NPO until surgery VTE PPx: SCDs Code Status: Full code Dispo: Admit patient to Inpatient with expected length of stay greater than 2 midnights.  Signed: Colbert Ewing, MD 05/25/2017, 2:06 PM  Pager: Mamie Nick [phone]

## 2017-05-25 NOTE — Progress Notes (Signed)
Patient arrived to 6N25 A&Ox4, VSS, IV intact.  No family at bedside.  Patient oriented to room and equipment.  Will continue to monitor.

## 2017-05-25 NOTE — Progress Notes (Signed)
Pharmacy Antibiotic Note  [name] is a 47 y.o. male admitted on 05/25/2017 with osteomyelitis.  Pharmacy has been consulted for vancomycin and cefepime dosing. SCr 1.28 with CrCl ~ 70 ml/min.  Vancomycin trough goal 15-20  Plan: 1) Vancomycin 1g IV q12 2) Cefepime 1g IV q8 3) Follow renal function, cultures, LOT, level if needed  Height: 5\' 4"  (162.6 cm) Weight: 189 lb (85.7 kg) IBW/kg (Calculated) : 59.2  Temp (24hrs), Avg:98.6 F (37 C), Min:98.6 F (37 C), Max:98.6 F (37 C)  Recent Labs  Lab 05/25/17 0827 05/25/17 0840 05/25/17 1142  WBC 11.7*  --   --   CREATININE 1.28*  --   --   LATICACIDVEN  --  0.74 0.72    Estimated Creatinine Clearance: 70.4 mL/min (A) (by C-G formula based on SCr of 1.28 mg/dL (H)).    Allergies  Allergen Reactions  . Bee Venom Swelling  . Penicillins Hives    Has patient had a PCN reaction causing immediate rash, facial/tongue/throat swelling, SOB or lightheadedness with hypotension: Yes Has patient had a PCN reaction causing severe rash involving mucus membranes or skin necrosis: No Has patient had a PCN reaction that required hospitalization: No Has patient had a PCN reaction occurring within the last 10 years: No If all of the above answers are "NO", then may proceed with Cephalosporin use.     Thank you for allowing pharmacy to be a part of this patient's care.  Deboraha Sprang 05/25/2017 3:27 PM

## 2017-05-25 NOTE — ED Triage Notes (Signed)
States he had left great toe amputated 6 months ago , now his right great toe is hurting the same way. C/o right shoulder pain denies injury.

## 2017-05-26 ENCOUNTER — Other Ambulatory Visit: Payer: Self-pay

## 2017-05-26 DIAGNOSIS — L97518 Non-pressure chronic ulcer of other part of right foot with other specified severity: Secondary | ICD-10-CM

## 2017-05-26 DIAGNOSIS — E11621 Type 2 diabetes mellitus with foot ulcer: Secondary | ICD-10-CM

## 2017-05-26 DIAGNOSIS — M25511 Pain in right shoulder: Secondary | ICD-10-CM

## 2017-05-26 LAB — CBC
HCT: 32.4 % — ABNORMAL LOW (ref 39.0–52.0)
Hemoglobin: 10.4 g/dL — ABNORMAL LOW (ref 13.0–17.0)
MCH: 27.7 pg (ref 26.0–34.0)
MCHC: 32.1 g/dL (ref 30.0–36.0)
MCV: 86.4 fL (ref 78.0–100.0)
Platelets: 330 10*3/uL (ref 150–400)
RBC: 3.75 MIL/uL — ABNORMAL LOW (ref 4.22–5.81)
RDW: 14 % (ref 11.5–15.5)
WBC: 11.8 10*3/uL — ABNORMAL HIGH (ref 4.0–10.5)

## 2017-05-26 LAB — GLUCOSE, CAPILLARY
Glucose-Capillary: 101 mg/dL — ABNORMAL HIGH (ref 65–99)
Glucose-Capillary: 111 mg/dL — ABNORMAL HIGH (ref 65–99)
Glucose-Capillary: 151 mg/dL — ABNORMAL HIGH (ref 65–99)
Glucose-Capillary: 167 mg/dL — ABNORMAL HIGH (ref 65–99)
Glucose-Capillary: 174 mg/dL — ABNORMAL HIGH (ref 65–99)
Glucose-Capillary: 206 mg/dL — ABNORMAL HIGH (ref 65–99)
Glucose-Capillary: 92 mg/dL (ref 65–99)
Glucose-Capillary: 99 mg/dL (ref 65–99)

## 2017-05-26 LAB — BASIC METABOLIC PANEL
Anion gap: 10 (ref 5–15)
BUN: 13 mg/dL (ref 6–20)
CO2: 24 mmol/L (ref 22–32)
Calcium: 8.5 mg/dL — ABNORMAL LOW (ref 8.9–10.3)
Chloride: 103 mmol/L (ref 101–111)
Creatinine, Ser: 1.36 mg/dL — ABNORMAL HIGH (ref 0.61–1.24)
GFR calc Af Amer: >60 mL/min (ref >60)
GFR calc non Af Amer: >60 mL/min (ref >60)
Glucose, Bld: 100 mg/dL — ABNORMAL HIGH (ref 65–99)
Potassium: 4 mmol/L (ref 3.5–5.1)
Sodium: 137 mmol/L (ref 135–145)

## 2017-05-26 NOTE — Progress Notes (Signed)
  Date: 05/26/2017  Patient name: [name]  Medical record number: [id number]  Date of birth: [birth year]   I have seen and evaluated this patient and I have discussed the plan of care with the house staff. Please see their note for complete details. I concur with their findings with the following additions/corrections:   47 yo M with h/o DM with toe amputation last year presenting with diabetic foot ulcers on 1st and 3rd toes of R foot that have gotten worse overtime. Intermittent significant pain. Foul odor.  X-rays show osteomyelitis of 1st and 3rd toe on right foot. Exam is consistent with this, significantly diminished sensation to light touch, but pulses and cap refill are maintained. No fevers or systemic symptoms.  We have consulted orthopedics for likely amputation, and will continue antibiotics in the meantime.  Lenice Pressman, M.D., Ph.D. 05/26/2017, 9:56 PM

## 2017-05-26 NOTE — Progress Notes (Addendum)
Subjective: No acute events overnight following admission. Denies subjective fever or chills. He is resting comfortably on rounds but notes continued pain to his shoulder and toe/foot, pain medicine has provided some relief. He has been NPO in anticipation of potential procedures, is hungry. No adverse reaction to cefepime. Ortho has been consulted. Have not been by to see him yet.  Objective:  Vital signs in last 24 hours: Vitals:   05/25/17 1730 05/25/17 1844 05/25/17 2232 05/26/17 0450  BP: 113/64 (!) 138/103 138/78 131/76  Pulse: 75 77 78 72  Resp: (!) 21 18 16 18   Temp:  98.1 F (36.7 C) 98.7 F (37.1 C) 98.3 F (36.8 C)  TempSrc:  Oral Oral Oral  SpO2: 100% 98% 100% 99%  Weight:  184 lb 4.9 oz (83.6 kg)    Height:  5\' 4"  (1.626 m)     General: Resting in bed comfortably, no acute distress HEENT: Normal conjuctiva, moist mucus membranes  CV: NRRR, nl s1 & s2, no m/r/g Resp: Clear breath sounds bilaterally, normal work of breathing, no distress  Abd: Soft, +BS Extr: s/p L great toe amputation. TTP of R shoulder with pain on ROM testing. R shoulder was above head on entering the room. Stable appearance of R great toe and 3rd toe, neurovascularly intact. Decreased sensation of R great toe. Neuro: Alert and oriented x3 Skin: Warm, dry    Assessment/Plan:  Active Problems:   Osteomyelitis [facility])  [name] is a 47yo male with PMH of diabetes c/b osteomyelitis s/p L great toe amputation and HTN who presents with 1 month of right great toe pain, found to have osteomyelitis of first and third distal phalanges on x-ray. Currently on Vanc/cefepime (has PCN allergy). Ortho has been consulted. Patient is currently NPO and holding lovenox.  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 Remains afebrile and HDS. WBC stable at 11.7. Blood cultures drawn. Ortho has been consulted. Currently on vancomycin and cefepime (has PCN allergy). -  Ortho consulted; appreciate their assistance - F/u ortho recs - NPO - Holding lovenox for possible surgery - Oxycodone 5mg  q4h PRN for pain - Senna BID PRN for constipation - F/u BCx 2/27 -> - Continue vancomycin and cefepime (2/27 -> - CBC in AM - Monitor fever curve  Right shoulder pain Passive and active ROM limited secondary to pain, although slightly improved today. Able to lift arm over his head, but states that it gets "stuck" in that position. Neurovascularly intact. Most likely adhesive capsulitis. - F/u BCx 2/27 -> - Antibiotics, as above - Tylenol PRN for mild pain - Oxycodone 5mg  q4h PRN for severe pain - PT eval  Diabetes Stopped all medications a few months ago. A1c in 07/2016 was 9.8. Was previously on insulin and metformin. BG 100-181 here. Patient reportedly has follow up with new PCP at [facility]. Will be important to improve glycemic control after this admission. - Hold home metformin - SSI-M q4h while NPO - CBG monitoring q4h  ?AKI Cr 1.36, from 1.28 on admission. (baseline 0.9 - 1.3). May be new baseline? - Avoid nephrotoxic agents - BMET in AM  HTN BP improved at 131/76 after restarting previous anti-hypertensives. Reports stopping all of his medications a few months ago. - Continue home amlodipine 10mg  daily, home lisinopril 20mg  daily, and home metoprolol 25mg  BID  HLD Stopped all medications a few months ago - Continue home atorvastatin 20mg  daily  Dispo: Anticipated discharge in approximately 3-4 day(s).  Joseph Ewing, MD 05/26/2017, 6:48 AM Pager: Mamie Nick [phone]

## 2017-05-26 NOTE — Evaluation (Signed)
Physical Therapy Evaluation Patient Details Name: [name] MRN: [id number] DOB: 02-04-1971 Today's Date: 05/26/2017   History of Present Illness  Pt is a 47 y/o male admitted secondary to R great and 3rd toe osteomyelitis. Possible OR on 3/1 or 3/2. Also presenting with R shoulder pain; per MD notes may be adhesive capsulitis. PMH includes DM, HTN, tobacco use, and L great toe amputation.   Clinical Impression  Pt admitted secondary to problem above with deficits below. Pt with R shoulder pain and presenting with limited ROM. Reviewed ROM HEP with pt for R shoulder pain. Pt overall requiring min guard to supervision for mobility. Pt reports possible OR on 3/1 or 3/2 to address R toe osteomyelitis, so will need to reassess mobility following surgery. At this time feel pt will benefit from outpatient PT to address R shoulder deficits, however, will update recommendations as needed following surgery. Will continue to follow acutely to maximize functional mobility independence and safety.      Follow Up Recommendations Outpatient PT;Supervision for mobility/OOB(for R shoulder pain )    Equipment Recommendations  Other (comment)(TBD following surgery )    Recommendations for Other Services OT consult     Precautions / Restrictions Precautions Precautions: None Restrictions Weight Bearing Restrictions: No      Mobility  Bed Mobility Overal bed mobility: Independent                Transfers Overall transfer level: Needs assistance Equipment used: None Transfers: Sit to/from Stand Sit to Stand: Min guard         General transfer comment: Min guard for safety.   Ambulation/Gait Ambulation/Gait assistance: Min guard;Supervision Ambulation Distance (Feet): 500 Feet Assistive device: None Gait Pattern/deviations: Step-through pattern;Decreased stride length;Wide base of support Gait velocity: WFL Gait velocity interpretation: at or above normal speed for  age/gender General Gait Details: Wide BOS with toe out gait throughout. Overall steady requring min guard to supervision for safety.   Stairs            Wheelchair Mobility    Modified Rankin (Stroke Patients Only)       Balance Overall balance assessment: Needs assistance Sitting-balance support: No upper extremity supported;Feet supported Sitting balance-Leahy Scale: Good     Standing balance support: No upper extremity supported;During functional activity Standing balance-Leahy Scale: Good                               Pertinent Vitals/Pain Pain Assessment: 0-10 Pain Score: 6  Pain Location: R shoulder  Pain Descriptors / Indicators: Aching Pain Intervention(s): Limited activity within patient's tolerance;Monitored during session;Repositioned    Home Living Family/patient expects to be discharged to:: Private residence Living Arrangements: Spouse/significant other Available Help at Discharge: Family;Available PRN/intermittently Type of Home: House Home Access: Level entry     Home Layout: One level Home Equipment: Walker - 2 wheels;Shower seat      Prior Function Level of Independence: Independent               Hand Dominance   Dominant Hand: Right    Extremity/Trunk Assessment   Upper Extremity Assessment Upper Extremity Assessment: RUE deficits/detail RUE Deficits / Details: Restrictions noted in shoulder ER, IR, and abduction. Unable to relax shoulder muscles for PT to perform PROM on shoulder. Very painful R shoulder which limited AROM.    Lower Extremity Assessment Lower Extremity Assessment: Overall WFL for tasks assessed    Cervical /  Trunk Assessment Cervical / Trunk Assessment: Normal  Communication   Communication: No difficulties  Cognition Arousal/Alertness: Awake/alert Behavior During Therapy: WFL for tasks assessed/performed Overall Cognitive Status: Within Functional Limits for tasks assessed                                         General Comments      Exercises Other Exercises Other Exercises: Educated about gentle AAROM using LUE for R shoulder ROM. Educated not to go past the point of pain.    Assessment/Plan    PT Assessment Patient needs continued PT services  PT Problem List Decreased strength;Decreased range of motion;Decreased mobility;Pain       PT Treatment Interventions Gait training;DME instruction;Functional mobility training;Therapeutic activities;Balance training;Therapeutic exercise;Neuromuscular re-education;Patient/family education    PT Goals (Current goals can be found in the Care Plan section)  Acute Rehab PT Goals Patient Stated Goal: to decreased R shoulder pain  PT Goal Formulation: With patient Time For Goal Achievement: 06/09/17 Potential to Achieve Goals: Good    Frequency Min 3X/week   Barriers to discharge        Co-evaluation               AM-PAC PT "6 Clicks" Daily Activity  Outcome Measure Difficulty turning over in bed (including adjusting bedclothes, sheets and blankets)?: None Difficulty moving from lying on back to sitting on the side of the bed? : None Difficulty sitting down on and standing up from a chair with arms (e.g., wheelchair, bedside commode, etc,.)?: Unable Help needed moving to and from a bed to chair (including a wheelchair)?: A Little Help needed walking in hospital room?: A Little Help needed climbing 3-5 steps with a railing? : A Little 6 Click Score: 18    End of Session Equipment Utilized During Treatment: Gait belt Activity Tolerance: Patient tolerated treatment well Patient left: in chair;with call bell/phone within reach(RUE positioned on pillow for comfort ) Nurse Communication: Mobility status PT Visit Diagnosis: Other abnormalities of gait and mobility (R26.89);Pain Pain - Right/Left: Right Pain - part of body: Shoulder    Time: 0375-4360 PT Time Calculation (min) (ACUTE ONLY): 20  min   Charges:   PT Evaluation $PT Eval Low Complexity: 1 Low     PT G Codes:        Leighton Ruff, PT, DPT  Acute Rehabilitation Services  Pager: [phone]   Rudean Hitt 05/26/2017, 7:12 PM

## 2017-05-26 NOTE — Consult Note (Signed)
Reason for Consult:Right digit osteomyelitis Referring Physician: [name] is an 47 y.o. male.  HPI: [name] was admitted after struggling for about [name] month+ of great toe ulceration and pain. He had [name] contralateral ulcer last year that resulted in great toe amputation contralaterally by Dr. Sharol Given. He c/o significant intermittent pain in the toe and foot.  Past Medical History:  Diagnosis Date  . Diabetes mellitus type II, uncontrolled ([place])   . Hyperlipidemia   . Hypertension   . Other and unspecified angina pectoris   . Tobacco use     Past Surgical History:  Procedure Laterality Date  . AMPUTATION Left 08/22/2016   Procedure: GREAT TOE AMPUTATION;  Surgeon: Joseph Minion, MD;  Location: [place];  Service: Orthopedics;  Laterality: Left;  . LEFT HEART CATHETERIZATION WITH CORONARY ANGIOGRAM N/[name] 07/31/2013   Procedure: LEFT HEART CATHETERIZATION WITH CORONARY ANGIOGRAM;  Surgeon: Joseph Booze, MD;  Location: [facility] CATH LAB;  Service: Cardiovascular;  Laterality: N/[name];  . spinal tap    . toe amputated      Family History  Problem Relation Age of Onset  . Cancer Mother   . Diabetes Father   . Hypertension Father     Social History:  reports that he has been smoking cigarettes.  He has [name] 20.00 pack-year smoking history. he has never used smokeless tobacco. He reports that he does not drink alcohol or use drugs.  Allergies:  Allergies  Allergen Reactions  . Bee Venom Swelling  . Penicillins Hives    Has patient had [name] PCN reaction causing immediate rash, facial/tongue/throat swelling, SOB or lightheadedness with hypotension: Yes Has patient had [name] PCN reaction causing severe rash involving mucus membranes or skin necrosis: No Has patient had [name] PCN reaction that required hospitalization: No Has patient had [name] PCN reaction occurring within the last 10 years: No If all of the above answers are "NO", then may proceed with Cephalosporin use.     Medications: I have  reviewed the patient's current medications.  Results for orders placed or performed during the hospital encounter of 05/25/17 (from the past 48 hour(s))  Comprehensive metabolic panel     Status: Abnormal   Collection Time: 05/25/17  8:27 AM  Result Value Ref Range   Sodium 137 135 - 145 mmol/L   Potassium 3.9 3.5 - 5.1 mmol/L   Chloride 105 101 - 111 mmol/L   CO2 22 22 - 32 mmol/L   Glucose, Bld 203 (H) 65 - 99 mg/dL   BUN 13 6 - 20 mg/dL   Creatinine, Ser 1.28 (H) 0.61 - 1.24 mg/dL   Calcium 9.1 8.9 - 10.3 mg/dL   Total Protein 7.3 6.5 - 8.1 g/dL   Albumin 2.8 (L) 3.5 - 5.0 g/dL   AST 14 (L) 15 - 41 U/L   ALT 14 (L) 17 - 63 U/L   Alkaline Phosphatase 92 38 - 126 U/L   Total Bilirubin 0.5 0.3 - 1.2 mg/dL   GFR calc non Af Amer >60 >60 mL/min   GFR calc Af Amer >60 >60 mL/min    Comment: (NOTE) The eGFR has been calculated using the CKD EPI equation. This calculation has not been validated in all clinical situations. eGFR's persistently <60 mL/min signify possible Chronic Kidney Disease.    Anion gap 10 5 - 15    Comment: Performed at [facility] [address], [place], [place] [address]  CBC with Differential  Status: Abnormal   Collection Time: 05/25/17  8:27 AM  Result Value Ref Range   WBC 11.7 (H) 4.0 - 10.5 K/uL   RBC 4.21 (L) 4.22 - 5.81 MIL/uL   Hemoglobin 11.4 (L) 13.0 - 17.0 g/dL   HCT 36.2 (L) 39.0 - 52.0 %   MCV 86.0 78.0 - 100.0 fL   MCH 27.1 26.0 - 34.0 pg   MCHC 31.5 30.0 - 36.0 g/dL   RDW 14.0 11.5 - 15.5 %   Platelets 347 150 - 400 K/uL   Neutrophils Relative % 72 %   Neutro Abs 8.5 (H) 1.7 - 7.7 K/uL   Lymphocytes Relative 20 %   Lymphs Abs 2.3 0.7 - 4.0 K/uL   Monocytes Relative 7 %   Monocytes Absolute 0.8 0.1 - 1.0 K/uL   Eosinophils Relative 1 %   Eosinophils Absolute 0.1 0.0 - 0.7 K/uL   Basophils Relative 0 %   Basophils Absolute 0.0 0.0 - 0.1 K/uL    Comment: Performed at [facility], [address], [place], [place]  [address]  I-Stat CG4 Lactic Acid, ED     Status: None   Collection Time: 05/25/17  8:40 AM  Result Value Ref Range   Lactic Acid, Venous 0.74 0.5 - 1.9 mmol/L  I-Stat CG4 Lactic Acid, ED     Status: None   Collection Time: 05/25/17 11:42 AM  Result Value Ref Range   Lactic Acid, Venous 0.72 0.5 - 1.9 mmol/L  Urinalysis, Routine w reflex microscopic     Status: Abnormal   Collection Time: 05/25/17  1:00 PM  Result Value Ref Range   Color, Urine YELLOW YELLOW   APPearance HAZY ([name]) CLEAR   Specific Gravity, Urine 1.025 1.005 - 1.030   pH 5.0 5.0 - 8.0   Glucose, UA 150 ([name]) NEGATIVE mg/dL   Hgb urine dipstick SMALL ([name]) NEGATIVE   Bilirubin Urine NEGATIVE NEGATIVE   Ketones, ur NEGATIVE NEGATIVE mg/dL   Protein, ur >=300 ([name]) NEGATIVE mg/dL   Nitrite NEGATIVE NEGATIVE   Leukocytes, UA NEGATIVE NEGATIVE   RBC / HPF 0-5 0 - 5 RBC/hpf   WBC, UA 0-5 0 - 5 WBC/hpf   Bacteria, UA RARE ([name]) NONE SEEN   Squamous Epithelial / LPF 0-5 ([name]) NONE SEEN   Mucus PRESENT    Hyaline Casts, UA PRESENT    Granular Casts, UA PRESENT     Comment: Performed at [facility], [address], [place], [place] [address]  CBG monitoring, ED     Status: Abnormal   Collection Time: 05/25/17  3:32 PM  Result Value Ref Range   Glucose-Capillary 123 (H) 65 - 99 mg/dL  MRSA PCR Screening     Status: None   Collection Time: 05/25/17  6:25 PM  Result Value Ref Range   MRSA by PCR NEGATIVE NEGATIVE    Comment:        The GeneXpert MRSA Assay (FDA approved for NASAL specimens only), is one component of [name] comprehensive MRSA colonization surveillance program. It is not intended to diagnose MRSA infection nor to guide or monitor treatment for MRSA infections. Performed at [facility], [place] [address], [place], [place] [address]   Glucose, capillary     Status: Abnormal   Collection Time: 05/25/17  8:20 PM  Result Value Ref Range   Glucose-Capillary 181 (H) 65 - 99 mg/dL   Comment 1 Notify RN    Glucose, capillary     Status:  Abnormal   Collection Time: 05/26/17 12:26 AM  Result Value Ref Range   Glucose-Capillary 167 (H) 65 - 99 mg/dL  Glucose, capillary     Status: Abnormal   Collection Time: 05/26/17  3:57 AM  Result Value Ref Range   Glucose-Capillary 111 (H) 65 - 99 mg/dL  CBC     Status: Abnormal   Collection Time: 05/26/17  5:17 AM  Result Value Ref Range   WBC 11.8 (H) 4.0 - 10.5 K/uL   RBC 3.75 (L) 4.22 - 5.81 MIL/uL   Hemoglobin 10.4 (L) 13.0 - 17.0 g/dL   HCT 32.4 (L) 39.0 - 52.0 %   MCV 86.4 78.0 - 100.0 fL   MCH 27.7 26.0 - 34.0 pg   MCHC 32.1 30.0 - 36.0 g/dL   RDW 14.0 11.5 - 15.5 %   Platelets 330 150 - 400 K/uL    Comment: Performed at [facility], [place], [place], [place] [address]  Basic metabolic panel     Status: Abnormal   Collection Time: 05/26/17  5:17 AM  Result Value Ref Range   Sodium 137 135 - 145 mmol/L   Potassium 4.0 3.5 - 5.1 mmol/L   Chloride 103 101 - 111 mmol/L   CO2 24 22 - 32 mmol/L   Glucose, Bld 100 (H) 65 - 99 mg/dL   BUN 13 6 - 20 mg/dL   Creatinine, Ser 1.36 (H) 0.61 - 1.24 mg/dL   Calcium 8.5 (L) 8.9 - 10.3 mg/dL   GFR calc non Af Amer >60 >60 mL/min   GFR calc Af Amer >60 >60 mL/min    Comment: (NOTE) The eGFR has been calculated using the CKD EPI equation. This calculation has not been validated in all clinical situations. eGFR's persistently <60 mL/min signify possible Chronic Kidney Disease.    Anion gap 10 5 - 15    Comment: Performed at [facility] [address] [place], [place] [address]  Glucose, capillary     Status: None   Collection Time: 05/26/17  8:27 AM  Result Value Ref Range   Glucose-Capillary 99 65 - 99 mg/dL  Glucose, capillary     Status: None   Collection Time: 05/26/17 12:02 PM  Result Value Ref Range   Glucose-Capillary 92 65 - 99 mg/dL  Glucose, capillary     Status: Abnormal   Collection Time: 05/26/17 12:39 PM  Result Value Ref Range   Glucose-Capillary 101 (H)  65 - 99 mg/dL    Dg Shoulder Right  Result Date: 05/25/2017 CLINICAL DATA:  Pain and decreased range of motion in the right shoulder for [name] few months. EXAM: RIGHT SHOULDER - 2+ VIEW COMPARISON:  04/22/2011 FINDINGS: Degenerative AC joint spurring that is directed superiorly. Chronic insertional type changes at the greater tuberosity. No glenohumeral narrowing or spurring. No fracture deformity or soft tissue calcification. IMPRESSION: 1. No acute finding or significant change from 2013. 2. AC joint spurring. Electronically Signed   By: Monte Fantasia M.D.   On: 05/25/2017 12:33   Dg Toe Great Right  Result Date: 05/25/2017 CLINICAL DATA:  Right great toe pain and wound. EXAM: RIGHT GREAT TOE COMPARISON:  Radiographs of December 02, 2013. FINDINGS: There is noted lytic destruction involving the distal tuft of the first distal phalanx consistent with osteomyelitis. Soft tissue gas is noted in the right first toe consistent with cellulitis. No radiopaque foreign body is noted. There is also noted lytic destruction involving the third distal phalanx with  overlying soft tissue ulceration. IMPRESSION: Findings consistent with osteomyelitis involving first and third distal phalanges. Electronically Signed   By: Marijo Conception, M.D.   On: 05/25/2017 12:33    Review of Systems  Constitutional: Negative for weight loss.  HENT: Negative for ear discharge, ear pain, hearing loss and tinnitus.   Eyes: Negative for blurred vision, double vision, photophobia and pain.  Respiratory: Negative for cough, sputum production and shortness of breath.   Cardiovascular: Negative for chest pain.  Gastrointestinal: Negative for abdominal pain, nausea and vomiting.  Genitourinary: Negative for dysuria, flank pain, frequency and urgency.  Musculoskeletal: Positive for joint pain (Right great toe). Negative for back pain, falls, myalgias and neck pain.  Neurological: Negative for dizziness, tingling, sensory change, focal  weakness, loss of consciousness and headaches.  Endo/Heme/Allergies: Does not bruise/bleed easily.  Psychiatric/Behavioral: Negative for depression, memory loss and substance abuse. The patient is not nervous/anxious.    Blood pressure 131/76, pulse 72, temperature 98.3 F (36.8 C), temperature source Oral, resp. rate 18, height 5' 4" (1.626 m), weight 83.6 kg (184 lb 4.9 oz), SpO2 99 %. Physical Exam  Constitutional: He appears well-developed and well-nourished. No distress.  HENT:  Head: Normocephalic and atraumatic.  Eyes: Conjunctivae are normal. Right eye exhibits no discharge. Left eye exhibits no discharge. No scleral icterus.  Neck: Normal range of motion.  Cardiovascular: Normal rate and regular rhythm.  Respiratory: Effort normal. No respiratory distress.  Musculoskeletal:  RLE Ulceration tip of great toe, sausage digit, malodorous, 3rd toe tip necrotic, no ecchymosis or rash  Nontender  No knee or ankle effusion  Knee stable to varus/ valgus and anterior/posterior stress  Sens DPN, SPN, TN intact  Motor EHL, ext, flex, evers 5/5  DP 2+, PT 1+, No significant edema   Neurological: He is alert.  Skin: Skin is warm and dry. He is not diaphoretic.  Psychiatric: He has [name] normal mood and affect. His behavior is normal.    Assessment/Plan: Right great toe ulceration, right great and 3rd toe phalangeal osteomyelitis, and right 3rd toe necrosis -- Will need some sort of amputation(s). Dr. Sharol Given to evaluate later today or in AM for definitive plan. Will make NPO after MN in case he can be put on schedule tomorrow. DM HTN    Lisette Abu, PA-C Orthopedic Surgery [phone] 05/26/2017, 12:58 PM

## 2017-05-26 NOTE — Discharge Summary (Addendum)
Name: [name] MRN: [id number] DOB: January 11, 1971 47 y.o. PCP: Cassandria Anger, MD  Date of Admission: 05/25/2017  9:46 AM Date of Discharge: 05/29/2017 Attending Physician: Dr. Beryle Beams  Discharge Diagnosis: 1. 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 Diabetes 3. Right shoulder pain  Discharge Medications: Allergies as of 05/29/2017      Reactions   Bee Venom Swelling   SWELLING REACTION UNSPECIFIED    Penicillins Hives, Other (See Comments)   PATIENT HAS HAD A PCN REACTION WITH IMMEDIATE RASH, FACIAL/TONGUE/THROAT SWELLING, SOB, OR LIGHTHEADEDNESS WITH HYPOTENSION:  #  #  #  YES  #  #  #   Has patient had a PCN reaction causing severe rash involving mucus membranes or skin necrosis: No Has patient had a PCN reaction that required hospitalization: No Has patient had a PCN reaction occurring within the last 10 years: No Allergy Severity noted prior to 05/28/17      Medication List    STOP taking these medications   insulin detemir 100 UNIT/ML injection Commonly known as:  LEVEMIR   insulin lispro 100 UNIT/ML injection Commonly known as:  HUMALOG   INSULIN SYRINGE .5CC/31GX5/16" 31G X 5/16" 0.5 ML Misc   metFORMIN 1000 MG tablet Commonly known as:  GLUCOPHAGE     TAKE these medications   aspirin EC 81 MG tablet Take 81 mg by mouth daily.   blood glucose meter kit and supplies Kit Dispense based on patient and insurance preference. Use up to four times daily as directed. (FOR ICD-9 250.00, 250.01).   ibuprofen 200 MG tablet Commonly known as:  ADVIL,MOTRIN Take 600 mg by mouth every 6 (six) hours as needed for moderate pain.   nitroGLYCERIN 0.4 MG SL tablet Commonly known as:  NITROSTAT Place 1 tablet (0.4 mg total) under the tongue every 5 (five) minutes as needed for chest pain.   oxyCODONE 5 MG immediate release tablet Commonly known as:  Oxy IR/ROXICODONE Take 1 tablet (5 mg total) by mouth every 4 (four) hours as needed for  severe pain.   senna-docusate 8.6-50 MG tablet Commonly known as:  Senokot-S While taking oxycodone, take 1-2 pills twice daily as needed to have a bowel movement daily.   sildenafil 20 MG tablet Commonly known as:  REVATIO Take 20 mg by mouth daily as needed for erectile dysfunction.   VITAMIN B-12 PO Take 1 capsule by mouth daily.       Disposition and follow-up:   [name] was discharged from [facility] in stable condition.  At the hospital follow up visit please address:  1.    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  Diabetes: Please ensure that the patient takes metformin 1068m bid, invokana 1052mqd, and close follow up with pcp   Right shoulder pain: please make sure the patient goes to physical therapy and follows with workup.   2.  Labs / imaging needed at time of follow-up: none  3.  Pending labs/ test needing follow-up: none  Follow-up Appointments: Follow-up Information    DuNewt MinionMD Follow up in 2 week(s).   Specialty:  Orthopedic Surgery Contact information: [phone]        PlCassandria AngerMD. Go on 06/24/2017.   Specialty:  Internal Medicine Why:  9 am Contact information: [address]  [place] [address] [phone]           Hospital Course by problem list:   1. Osteomyelitis of right 1st and 3rd distal phalanges Patient presented with 1 month of worsening great toe pain in the setting of uncontrolled diabetes. Stopped all of his medications a few months ago due to financial issues. X-ray consistent with osteomyelitis of right 1st and 3rd distal phalanges. He was afebrile and WBC elevated to 11.8 on HDS. He was started on vancomycin and cefepime (due to PCN allergy). Ortho was consulted and he had amputation of his 1st and  3rd toe on 05/28/17. The patient did well subsequent to the procedure and he was continued on antibiotics for 24 hrs after. The patient had no growth on blood cultures for 5 days.   2. Diabetes Patient reports that he stopped all his medications a few months ago due to loss of insurance. A1c in 07/2016 was 9.8. He was previously on insulin and metformin. He states that he has an appointment to establish care with a new PCP at [facility]. Will need improved glycemic control after this admission.  He required very minimal amount of sliding scale insulin (4-5 units) during hospitalization. The patient had blood glucose ranging 102-124 on day of discharge. He was discharged with instructions to take invokana 156m daily and metformin 10040mbid. He was told to follow up with pcp within 1-2 weeks.   3. Right shoulder pain Limited active and passive ROM due to pain. Has reportedly had previous rotator cuff injury causing similar pain. X-ray of right shoulder showed no acute changes. Neurovascularly intact. Pain is most likely due to adhesive capsulitis. PT was consulted to assist with ROM exercises and they recommended outpatient PT.   Discharge Vitals:   BP (!) 142/85 (BP Location: Right Arm)   Pulse 80   Temp 99.2 F (37.3 C) (Oral)   Resp 18   Ht _0  (1.626 m)   Wt 184 lb 4.9 oz (83.6 kg)   SpO2 100%   BMI 31.64 kg/m   Pertinent Labs, Studies, and Procedures:  CBC Latest Ref Rng & Units 05/28/2017 05/27/2017 05/26/2017  WBC 4.0 - 10.5 K/uL 12.3(H) 12.6(H) 11.8(H)  Hemoglobin 13.0 - 17.0 g/dL 9.9(L) 9.9(L) 10.4(L)  Hematocrit 39.0 - 52.0 % 31.0(L) 31.5(L) 32.4(L)  Platelets 150 - 400 K/uL 344 331 330   CMP Latest Ref Rng & Units 06/01/2017 05/28/2017 05/27/2017  Glucose 70 - 99 mg/dL 154(H) 102(H) 130(H)  BUN 6 - 23 mg/dL _1 Creatinine 0.40 - 1.50 mg/dL 1.06 1.30(H) 1.33(H)  Sodium 135 - 145 mEq/L 140 137 136  Potassium 3.5 - 5.1 mEq/L 3.9 3.7 3.8  Chloride 96 - 112 mEq/L 103 104 104  CO2 19 -  32 mEq/L _2 Calcium 8.4 - 10.5 mg/dL 8.9 8.2(L) 8.3(L)  Total Protein 6.0 - 8.3 g/dL 7.0 - -  Total Bilirubin 0.2 - 1.2 mg/dL 0.2 - -  Alkaline Phos 39 - 117 U/L 91 - -  AST 0 - 37 U/L 10 - -  ALT 0 - 53 U/L 14 - -   Lactic acid 0.74 -> 0.72 UA with small Hgb, 150 glucose, 300 protein, rare bacteria, 0-5 squam epithelial BCx (2/27) no growth  Right shoulder x-ray 05/25/2017 1. No acute finding or significant change from 2013. 2. AC joint spurring.  Right great toe x-ray 05/25/2017 Findings consistent with osteomyelitis involving first and third distal phalanges.  Discharge Instructions: Discharge  Instructions    Call MD for:  extreme fatigue   Complete by:  As directed    Call MD for:  persistant dizziness or light-headedness   Complete by:  As directed    Call MD for:  redness, tenderness, or signs of infection (pain, swelling, redness, odor or green/yellow discharge around incision site)   Complete by:  As directed    Call MD for:  severe uncontrolled pain   Complete by:  As directed    Call MD for:  temperature >100.4   Complete by:  As directed    Diet - low sodium heart healthy   Complete by:  As directed    Increase activity slowly   Complete by:  As directed      Signed: Lars Mage, MD 06/02/2017, 3:31 PM   Pager: [phone]

## 2017-05-27 DIAGNOSIS — E1152 Type 2 diabetes mellitus with diabetic peripheral angiopathy with gangrene: Secondary | ICD-10-CM

## 2017-05-27 DIAGNOSIS — L97519 Non-pressure chronic ulcer of other part of right foot with unspecified severity: Secondary | ICD-10-CM

## 2017-05-27 DIAGNOSIS — L089 Local infection of the skin and subcutaneous tissue, unspecified: Secondary | ICD-10-CM

## 2017-05-27 DIAGNOSIS — M86171 Other acute osteomyelitis, right ankle and foot: Secondary | ICD-10-CM

## 2017-05-27 DIAGNOSIS — E11628 Type 2 diabetes mellitus with other skin complications: Secondary | ICD-10-CM

## 2017-05-27 DIAGNOSIS — L84 Corns and callosities: Secondary | ICD-10-CM

## 2017-05-27 DIAGNOSIS — I96 Gangrene, not elsewhere classified: Secondary | ICD-10-CM

## 2017-05-27 DIAGNOSIS — N179 Acute kidney failure, unspecified: Secondary | ICD-10-CM

## 2017-05-27 LAB — GLUCOSE, CAPILLARY
Glucose-Capillary: 119 mg/dL — ABNORMAL HIGH (ref 65–99)
Glucose-Capillary: 124 mg/dL — ABNORMAL HIGH (ref 65–99)
Glucose-Capillary: 139 mg/dL — ABNORMAL HIGH (ref 65–99)
Glucose-Capillary: 170 mg/dL — ABNORMAL HIGH (ref 65–99)
Glucose-Capillary: 177 mg/dL — ABNORMAL HIGH (ref 65–99)
Glucose-Capillary: 186 mg/dL — ABNORMAL HIGH (ref 65–99)

## 2017-05-27 LAB — BASIC METABOLIC PANEL
Anion gap: 8 (ref 5–15)
BUN: 16 mg/dL (ref 6–20)
CO2: 24 mmol/L (ref 22–32)
Calcium: 8.3 mg/dL — ABNORMAL LOW (ref 8.9–10.3)
Chloride: 104 mmol/L (ref 101–111)
Creatinine, Ser: 1.33 mg/dL — ABNORMAL HIGH (ref 0.61–1.24)
GFR calc Af Amer: >60 mL/min (ref >60)
GFR calc non Af Amer: >60 mL/min (ref >60)
Glucose, Bld: 130 mg/dL — ABNORMAL HIGH (ref 65–99)
Potassium: 3.8 mmol/L (ref 3.5–5.1)
Sodium: 136 mmol/L (ref 135–145)

## 2017-05-27 LAB — CBC
HCT: 31.5 % — ABNORMAL LOW (ref 39.0–52.0)
Hemoglobin: 9.9 g/dL — ABNORMAL LOW (ref 13.0–17.0)
MCH: 27.1 pg (ref 26.0–34.0)
MCHC: 31.4 g/dL (ref 30.0–36.0)
MCV: 86.3 fL (ref 78.0–100.0)
Platelets: 331 10*3/uL (ref 150–400)
RBC: 3.65 MIL/uL — ABNORMAL LOW (ref 4.22–5.81)
RDW: 13.9 % (ref 11.5–15.5)
WBC: 12.6 10*3/uL — ABNORMAL HIGH (ref 4.0–10.5)

## 2017-05-27 MED ORDER — CHLORHEXIDINE GLUCONATE 4 % EX LIQD
60.0000 mL | Freq: Once | CUTANEOUS | Status: AC
  Administered 2017-05-28: 4 via TOPICAL
  Filled 2017-05-27: qty 15

## 2017-05-27 MED ORDER — POVIDONE-IODINE 10 % EX SWAB: 2.0000 "application " | Freq: Once | CUTANEOUS | Status: DC

## 2017-05-27 MED ORDER — CLINDAMYCIN PHOSPHATE 900 MG/50ML IV SOLN
900.0000 mg | INTRAVENOUS | Status: AC
  Administered 2017-05-28: 900 mg via INTRAVENOUS
  Filled 2017-05-27 (×2): qty 50

## 2017-05-27 NOTE — Progress Notes (Signed)
   Subjective: [name] was seen laying in his bed this morning doing well and stated that he is aware that he will need to get an amputation.   Objective:  Vital signs in last 24 hours: Vitals:   05/26/17 0450 05/26/17 1354 05/26/17 1942 05/27/17 0347  BP: 131/76 (!) 148/78 (!) 149/81 122/73  Pulse: 72 80 86 76  Resp: 18 (!) 22 17 17   Temp: 98.3 F (36.8 C) 98.8 F (37.1 C) 98.9 F (37.2 C) 98.6 F (37 C)  TempSrc: Oral Oral Oral Oral  SpO2: 99% 100% 100% 100%  Weight:      Height:       Physical Exam  Constitutional: He appears well-developed and well-nourished. No distress.  HENT:  Head: Normocephalic and atraumatic.  Eyes: Conjunctivae are normal.  Cardiovascular: Normal rate, regular rhythm and normal heart sounds.  Respiratory: Effort normal and breath sounds normal. No respiratory distress. He has no wheezes.  GI: Soft. Bowel sounds are normal. He exhibits no distension. There is no tenderness.  Musculoskeletal: He exhibits no edema.  Neurological: He is alert.  Skin: Skin is dry. He is not diaphoretic.  Right 1st and 3rd toe are with necrotic tissue and erythematous. Left first toe has amputated. Several calluses present on the plantar aspect of bilateral feet  Psychiatric: He has a normal mood and affect. His behavior is normal. Judgment and thought content normal.    Assessment/Plan:  [name] is a 47yo male with Diabetes mellitus type 2, htn, and history of osteomyelitis of left great toe s/p in first toe amputation presented with one month history of right great toe pain. Right foot xray showed osteomyelitis of first and third distal phalanges.   Osteomyelitis of right 1st and 3rd distal phalanges Patient is currently on vancomycin and cefepime for osteomyelitis.  The BX evaluated patient this morning and scheduled patient for amputation of great toe and third toe on 05/28/2017. CBC this morning showed wbc=12.6 from prior 11.8 yesterday. The patient is  currently afebrile.   -Appreciate orthopedic recommendations -NPO at midnight 05/27/17 -Holding lovenox  -Continue Oxycodone 5mg  q4h PRN for pain -Senna BID PRN for constipation -Blood culture showing no growth for 2 days -Continue vancomycin and cefepime (2/27 ->present) -CBC in AM  Right shoulder pain Thought to be possible septic arthritis vs impingement.  Will consider evaluation with [facility].   - Tylenol PRN for mild pain - Oxycodone 5mg  q4h PRN for severe pain - PT recommended outpatient PT to continue working with patient   Diabetes mellitus Patient's last A1c was 9.8 in May 2018.  The patient states that he stopped all his medication a few months ago.    Patient is currently been placed on a scale insulin every 4 hours.  Patient's glucose has ranged 119-177 over the past 24 hours.  - CBG monitoring q4h -We will counsel the patient on importance of good blood glucose control in order to prevent further infections.  AKI Cr=1.33 from 1.36 yesterday, baseline 0.9 - 1.2   - Avoid nephrotoxic agents - Bmp in AM  HTN His blood pressure has ranged 122-149/73-81  - Continue amlodipine 10mg  daily, lisinopril 20mg  daily, and metoprolol 25mg  BID  HLD - Continue atorvastatin 20mg  daily   Dispo: Anticipated discharge in approximately 1-2 day(s).   Joseph Mage, MD Internal Medicine PGY1 [phone] 05/27/2017, 1:34 PM

## 2017-05-27 NOTE — Consult Note (Signed)
ORTHOPAEDIC CONSULTATION  REQUESTING PHYSICIAN: Oda Kilts, MD  Chief Complaint: Osteomyelitis ulceration drainage right foot first and third toe.  HPI: [name] is a 47 y.o. male who presents with osteomyelitis ulceration right foot great toe and third toe.  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  Past Medical History:  Diagnosis Date  . Diabetes mellitus type II, uncontrolled ([place])   . Hyperlipidemia   . Hypertension   . Other and unspecified angina pectoris   . Tobacco use    Past Surgical History:  Procedure Laterality Date  . AMPUTATION Left 08/22/2016   Procedure: GREAT TOE AMPUTATION;  Surgeon: [name], MD;  Location: [facility];  Service: Orthopedics;  Laterality: Left;  . LEFT HEART CATHETERIZATION WITH CORONARY ANGIOGRAM N/A 07/31/2013   Procedure: LEFT HEART CATHETERIZATION WITH CORONARY ANGIOGRAM;  Surgeon: Jettie Booze, MD;  Location: [facility] CATH LAB;  Service: Cardiovascular;  Laterality: N/A;  . spinal tap    . toe amputated     Social History   Socioeconomic History  . Marital status: Single    Spouse name: None  . Number of children: None  . Years of education: None  . Highest education level: None  Social Needs  . Financial resource strain: None  . Food insecurity - worry: None  . Food insecurity - inability: None  . Transportation needs - medical: None  . Transportation needs - non-medical: None  Occupational History  . Occupation: Leisure centre manager: [employer]  Tobacco Use  . Smoking status: Current Every Day Smoker    Packs/day: 1.00    Years: 20.00    Pack years: 20.00    Types: Cigarettes  . Smokeless tobacco: Never Used  . Tobacco comment: wants patches   Substance and Sexual Activity  . Alcohol use: No    Comment: occ  . Drug use: No  . Sexual activity: Not Currently  Other Topics Concern  . None  Social History Narrative   Works in [employer],  recently moved in early 2018 from [place]. Previous saw free clinic providers.       Living with and helping take are of his elderly father.   Family History  Problem Relation Age of Onset  . Cancer Mother   . Diabetes Father   . Hypertension Father    - negative except otherwise stated in the family history section Allergies  Allergen Reactions  . Bee Venom Swelling  . Penicillins Hives    Has patient had a PCN reaction causing immediate rash, facial/tongue/throat swelling, SOB or lightheadedness with hypotension: Yes Has patient had a PCN reaction causing severe rash involving mucus membranes or skin necrosis: No Has patient had a PCN reaction that required hospitalization: No Has patient had a PCN reaction occurring within the last 10 years: No If all of the above answers are "NO", then may proceed with Cephalosporin use.    Prior to Admission medications   Medication Sig Start Date End Date Taking? Authorizing Provider  amLODipine (NORVASC) 10 MG tablet Take 1 tablet (10 mg total) by mouth daily. 09/01/16 05/25/17 Yes Minus Liberty, MD  aspirin EC 81 MG tablet Take 81 mg by mouth daily. 02/11/16  Yes [provider]  atorvastatin (LIPITOR) 20 MG tablet Take 1 tablet (20 mg total) by mouth daily. 08/23/16 05/25/17 Yes Minus Liberty, MD  citalopram (CELEXA) 20 MG tablet Take 1 tablet (20 mg total) by  mouth daily. 09/01/16 09/01/17 Yes Minus Liberty, MD  Cyanocobalamin (VITAMIN B-12 PO) Take 1 capsule by mouth daily.   Yes [provider]  gabapentin (NEURONTIN) 300 MG capsule Take 1 capsule (300 mg total) by mouth 3 (three) times daily. 08/23/16 05/25/17 Yes Minus Liberty, MD  ibuprofen (ADVIL,MOTRIN) 200 MG tablet Take 600 mg by mouth every 6 (six) hours as needed for moderate pain.   Yes [provider]  insulin detemir (LEVEMIR) 100 UNIT/ML injection Inject 0.1 mLs (10 Units total) into the skin at bedtime. 08/25/16 05/25/17 Yes  Minus Liberty, MD  insulin lispro (HUMALOG) 100 UNIT/ML injection Inject 0.1 mLs (10 Units total) into the skin daily with supper. Patient taking differently: Inject 15 Units into the skin daily with supper.  08/24/16 05/25/17 Yes Minus Liberty, MD  lisinopril (PRINIVIL,ZESTRIL) 20 MG tablet Take 1 tablet (20 mg total) by mouth daily. 08/23/16 05/25/17 Yes Minus Liberty, MD  metFORMIN (GLUCOPHAGE) 1000 MG tablet Take 0.5 tablets (500 mg total) by mouth 2 (two) times daily with a meal. 09/01/16 05/25/17 Yes Minus Liberty, MD  methocarbamol (ROBAXIN) 500 MG tablet Take 1 tablet (500 mg total) by mouth 2 (two) times daily. 04/25/17  Yes Lacretia Leigh, MD  metoprolol tartrate (LOPRESSOR) 25 MG tablet Take 1 tablet (25 mg total) by mouth 2 (two) times daily. 08/23/16 05/25/17 Yes Minus Liberty, MD  nitroGLYCERIN (NITROSTAT) 0.4 MG SL tablet Place 1 tablet (0.4 mg total) under the tongue every 5 (five) minutes as needed for chest pain. 07/26/13  Yes Jettie Booze, MD  senna-docusate (SENOKOT-S) 8.6-50 MG tablet While taking oxycodone, take 1-2 pills twice daily as needed to have a bowel movement daily. 08/23/16  Yes Minus Liberty, MD  sildenafil (REVATIO) 20 MG tablet Take 20 mg by mouth daily as needed for erectile dysfunction. 07/07/15  Yes [provider]  blood glucose meter kit and supplies KIT Dispense based on patient and insurance preference. Use up to four times daily as directed. (FOR ICD-9 250.00, 250.01). 08/25/16   Minus Liberty, MD  Insulin Syringe-Needle U-100 (INSULIN SYRINGE .5CC/31GX5/16") 31G X 5/16" 0.5 ML MISC 1 each by Does not apply route as needed. 08/24/16   Minus Liberty, MD  nicotine (NICODERM CQ - DOSED IN MG/24 HOURS) 21 mg/24hr patch Place 1 patch (21 mg total) onto the skin daily. Patient not taking: Reported on 04/25/2017 08/30/16   Suzan Slick, NP   Dg Shoulder Right  Result Date: 05/25/2017 CLINICAL DATA:  Pain and  decreased range of motion in the right shoulder for a few months. EXAM: RIGHT SHOULDER - 2+ VIEW COMPARISON:  04/22/2011 FINDINGS: Degenerative AC joint spurring that is directed superiorly. Chronic insertional type changes at the greater tuberosity. No glenohumeral narrowing or spurring. No fracture deformity or soft tissue calcification. IMPRESSION: 1. No acute finding or significant change from 2013. 2. AC joint spurring. Electronically Signed   By: Monte Fantasia M.D.   On: 05/25/2017 12:33   Dg Toe Great Right  Result Date: 05/25/2017 CLINICAL DATA:  Right great toe pain and wound. EXAM: RIGHT GREAT TOE COMPARISON:  Radiographs of December 02, 2013. FINDINGS: There is noted lytic destruction involving the distal tuft of the first distal phalanx consistent with osteomyelitis. Soft tissue gas is noted in the right first toe consistent with cellulitis. No radiopaque foreign body is noted. There is also noted lytic destruction involving the third distal phalanx with overlying soft tissue ulceration. IMPRESSION: Findings consistent with osteomyelitis involving first and third distal  phalanges. Electronically Signed   By: Marijo Conception, M.D.   On: 05/25/2017 12:33   - pertinent xrays, CT, MRI studies were reviewed and independently interpreted  Positive ROS: All other systems have been reviewed and were otherwise negative with the exception of those mentioned in the HPI and as above.  Physical Exam: General: Alert, no acute distress Psychiatric: Patient is competent for consent with normal mood and affect Lymphatic: No axillary or cervical lymphadenopathy Cardiovascular: No pedal edema Respiratory: No cyanosis, no use of accessory musculature GI: No organomegaly, abdomen is soft and non-tender  Skin: Patient has maceration ulceration and draining from the great toe and third toe.  There is no ascending cellulitis.  Images:  '@ENCIMAGES'$ @   Neurologic: Patient does not have protective  sensation bilateral lower extremities.   MUSCULOSKELETAL:  Examination patient has a palpable pulse.  Radiographs shows osteomyelitis of the tuft of the great toe and third toe right foot.  He has callus on the plantar aspect of both feet there is no open ulcers on the plantar aspect of his feet patient has no venous ulcers.  Assessment: Assessment: Diabetic insensate neuropathy with osteomyelitis ulceration abscess right great toe and right third toe.  Plan: Plan: We will plan for amputation of the great toe and third toe tomorrow morning Saturday.  Risks and benefits were discussed including risk of the wound not healing.  Patient states he understands wished to proceed at this time  Thank you for the consult and the opportunity to see Mr. Berl Bonfanti, [place] [phone] 8:09 AM

## 2017-05-27 NOTE — H&P (View-Only) (Signed)
ORTHOPAEDIC CONSULTATION  REQUESTING PHYSICIAN: Oda Kilts, MD  Chief Complaint: Osteomyelitis ulceration drainage right foot first and third toe.  HPI: [name] is a 47 y.o. male who presents with osteomyelitis ulceration right foot great toe and third toe.  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  Past Medical History:  Diagnosis Date  . Diabetes mellitus type II, uncontrolled ([place])   . Hyperlipidemia   . Hypertension   . Other and unspecified angina pectoris   . Tobacco use    Past Surgical History:  Procedure Laterality Date  . AMPUTATION Left 08/22/2016   Procedure: GREAT TOE AMPUTATION;  Surgeon: [name], MD;  Location: [facility];  Service: Orthopedics;  Laterality: Left;  . LEFT HEART CATHETERIZATION WITH CORONARY ANGIOGRAM N/A 07/31/2013   Procedure: LEFT HEART CATHETERIZATION WITH CORONARY ANGIOGRAM;  Surgeon: Jettie Booze, MD;  Location: [facility] CATH LAB;  Service: Cardiovascular;  Laterality: N/A;  . spinal tap    . toe amputated     Social History   Socioeconomic History  . Marital status: Single    Spouse name: None  . Number of children: None  . Years of education: None  . Highest education level: None  Social Needs  . Financial resource strain: None  . Food insecurity - worry: None  . Food insecurity - inability: None  . Transportation needs - medical: None  . Transportation needs - non-medical: None  Occupational History  . Occupation: Leisure centre manager: [employer]  Tobacco Use  . Smoking status: Current Every Day Smoker    Packs/day: 1.00    Years: 20.00    Pack years: 20.00    Types: Cigarettes  . Smokeless tobacco: Never Used  . Tobacco comment: wants patches   Substance and Sexual Activity  . Alcohol use: No    Comment: occ  . Drug use: No  . Sexual activity: Not Currently  Other Topics Concern  . None  Social History Narrative   Works in [employer],  recently moved in early 2018 from [place]. Previous saw free clinic providers.       Living with and helping take are of his elderly father.   Family History  Problem Relation Age of Onset  . Cancer Mother   . Diabetes Father   . Hypertension Father    - negative except otherwise stated in the family history section Allergies  Allergen Reactions  . Bee Venom Swelling  . Penicillins Hives    Has patient had a PCN reaction causing immediate rash, facial/tongue/throat swelling, SOB or lightheadedness with hypotension: Yes Has patient had a PCN reaction causing severe rash involving mucus membranes or skin necrosis: No Has patient had a PCN reaction that required hospitalization: No Has patient had a PCN reaction occurring within the last 10 years: No If all of the above answers are "NO", then may proceed with Cephalosporin use.    Prior to Admission medications   Medication Sig Start Date End Date Taking? Authorizing Provider  amLODipine (NORVASC) 10 MG tablet Take 1 tablet (10 mg total) by mouth daily. 09/01/16 05/25/17 Yes Minus Liberty, MD  aspirin EC 81 MG tablet Take 81 mg by mouth daily. 02/11/16  Yes [provider]  atorvastatin (LIPITOR) 20 MG tablet Take 1 tablet (20 mg total) by mouth daily. 08/23/16 05/25/17 Yes Minus Liberty, MD  citalopram (CELEXA) 20 MG tablet Take 1 tablet (20 mg total) by  mouth daily. 09/01/16 09/01/17 Yes Minus Liberty, MD  Cyanocobalamin (VITAMIN B-12 PO) Take 1 capsule by mouth daily.   Yes [provider]  gabapentin (NEURONTIN) 300 MG capsule Take 1 capsule (300 mg total) by mouth 3 (three) times daily. 08/23/16 05/25/17 Yes Minus Liberty, MD  ibuprofen (ADVIL,MOTRIN) 200 MG tablet Take 600 mg by mouth every 6 (six) hours as needed for moderate pain.   Yes [provider]  insulin detemir (LEVEMIR) 100 UNIT/ML injection Inject 0.1 mLs (10 Units total) into the skin at bedtime. 08/25/16 05/25/17 Yes  Minus Liberty, MD  insulin lispro (HUMALOG) 100 UNIT/ML injection Inject 0.1 mLs (10 Units total) into the skin daily with supper. Patient taking differently: Inject 15 Units into the skin daily with supper.  08/24/16 05/25/17 Yes Minus Liberty, MD  lisinopril (PRINIVIL,ZESTRIL) 20 MG tablet Take 1 tablet (20 mg total) by mouth daily. 08/23/16 05/25/17 Yes Minus Liberty, MD  metFORMIN (GLUCOPHAGE) 1000 MG tablet Take 0.5 tablets (500 mg total) by mouth 2 (two) times daily with a meal. 09/01/16 05/25/17 Yes Minus Liberty, MD  methocarbamol (ROBAXIN) 500 MG tablet Take 1 tablet (500 mg total) by mouth 2 (two) times daily. 04/25/17  Yes Lacretia Leigh, MD  metoprolol tartrate (LOPRESSOR) 25 MG tablet Take 1 tablet (25 mg total) by mouth 2 (two) times daily. 08/23/16 05/25/17 Yes Minus Liberty, MD  nitroGLYCERIN (NITROSTAT) 0.4 MG SL tablet Place 1 tablet (0.4 mg total) under the tongue every 5 (five) minutes as needed for chest pain. 07/26/13  Yes Jettie Booze, MD  senna-docusate (SENOKOT-S) 8.6-50 MG tablet While taking oxycodone, take 1-2 pills twice daily as needed to have a bowel movement daily. 08/23/16  Yes Minus Liberty, MD  sildenafil (REVATIO) 20 MG tablet Take 20 mg by mouth daily as needed for erectile dysfunction. 07/07/15  Yes [provider]  blood glucose meter kit and supplies KIT Dispense based on patient and insurance preference. Use up to four times daily as directed. (FOR ICD-9 250.00, 250.01). 08/25/16   Minus Liberty, MD  Insulin Syringe-Needle U-100 (INSULIN SYRINGE .5CC/31GX5/16") 31G X 5/16" 0.5 ML MISC 1 each by Does not apply route as needed. 08/24/16   Minus Liberty, MD  nicotine (NICODERM CQ - DOSED IN MG/24 HOURS) 21 mg/24hr patch Place 1 patch (21 mg total) onto the skin daily. Patient not taking: Reported on 04/25/2017 08/30/16   Suzan Slick, NP   Dg Shoulder Right  Result Date: 05/25/2017 CLINICAL DATA:  Pain and  decreased range of motion in the right shoulder for a few months. EXAM: RIGHT SHOULDER - 2+ VIEW COMPARISON:  04/22/2011 FINDINGS: Degenerative AC joint spurring that is directed superiorly. Chronic insertional type changes at the greater tuberosity. No glenohumeral narrowing or spurring. No fracture deformity or soft tissue calcification. IMPRESSION: 1. No acute finding or significant change from 2013. 2. AC joint spurring. Electronically Signed   By: Monte Fantasia M.D.   On: 05/25/2017 12:33   Dg Toe Great Right  Result Date: 05/25/2017 CLINICAL DATA:  Right great toe pain and wound. EXAM: RIGHT GREAT TOE COMPARISON:  Radiographs of December 02, 2013. FINDINGS: There is noted lytic destruction involving the distal tuft of the first distal phalanx consistent with osteomyelitis. Soft tissue gas is noted in the right first toe consistent with cellulitis. No radiopaque foreign body is noted. There is also noted lytic destruction involving the third distal phalanx with overlying soft tissue ulceration. IMPRESSION: Findings consistent with osteomyelitis involving first and third distal  phalanges. Electronically Signed   By: Marijo Conception, M.D.   On: 05/25/2017 12:33   - pertinent xrays, CT, MRI studies were reviewed and independently interpreted  Positive ROS: All other systems have been reviewed and were otherwise negative with the exception of those mentioned in the HPI and as above.  Physical Exam: General: Alert, no acute distress Psychiatric: Patient is competent for consent with normal mood and affect Lymphatic: No axillary or cervical lymphadenopathy Cardiovascular: No pedal edema Respiratory: No cyanosis, no use of accessory musculature GI: No organomegaly, abdomen is soft and non-tender  Skin: Patient has maceration ulceration and draining from the great toe and third toe.  There is no ascending cellulitis.  Images:  '@ENCIMAGES'$ @   Neurologic: Patient does not have protective  sensation bilateral lower extremities.   MUSCULOSKELETAL:  Examination patient has a palpable pulse.  Radiographs shows osteomyelitis of the tuft of the great toe and third toe right foot.  He has callus on the plantar aspect of both feet there is no open ulcers on the plantar aspect of his feet patient has no venous ulcers.  Assessment: Assessment: Diabetic insensate neuropathy with osteomyelitis ulceration abscess right great toe and right third toe.  Plan: Plan: We will plan for amputation of the great toe and third toe tomorrow morning Saturday.  Risks and benefits were discussed including risk of the wound not healing.  Patient states he understands wished to proceed at this time  Thank you for the consult and the opportunity to see Mr. Zimere Dunlevy, [place] [phone] 8:09 AM

## 2017-05-27 NOTE — Progress Notes (Signed)
Medicine attending: I examined this patient today together with resident physician Dr. Lars Mage and I concur with her evaluation and management plan which we discussed together. We appreciate prompt orthopedic surgery consultation. 47 year old man with hypertension, type 2 diabetes, previous history of osteomyelitis of the left great toe resulting in amputation now presenting with pain of the right great toe with x-rays showing osteomyelitis of the first and third digits. He has been afebrile.  Initial white count 11,700 little change from previous baselines going back to October 2018. On exam, great toe right foot is necrosing and foul-smelling.  Third digit with ischemic changes. Lungs are clear.  Regular cardiac rhythm without murmur. Impression: Infected diabetic foot ulcer with associated osteomyelitis Plan: Amputation per orthopedic surgery on March 2.  Continue antibiotics until source removed.  MRSA screen is negative.  I would favor stopping vancomycin.

## 2017-05-27 NOTE — Evaluation (Signed)
Occupational Therapy Evaluation Patient Details Name: [name] MRN: [id number] DOB: [birth year] Today's Date: 05/27/2017    History of Present Illness Pt is a 47 y/o male admitted secondary to R great and 3rd toe osteomyelitis. Possible OR on 3/1 or 3/2. Also presenting with R shoulder pain; per MD notes may be adhesive capsulitis. PMH includes DM, HTN, tobacco use, and L great toe amputation.    Clinical Impression   Pt reports he was independent with ADL and mobility PTA. Currently pt overall mod I with ADL and functional mobility. Pt presenting with R foot pain < R shoulder pain with limited AROM and decreased strength (grossly 4/5). Pt planning to d/c home with intermittent supervision. Pt with pending sx for R great and 3rd toe amputation (planned for 3/2); will keep on OT caseload and follow up post op for any further OT needs.     Follow Up Recommendations  No OT follow up    Equipment Recommendations  None recommended by OT    Recommendations for Other Services       Precautions / Restrictions Precautions Precautions: None Restrictions Weight Bearing Restrictions: No      Mobility Bed Mobility Overal bed mobility: Independent                Transfers Overall transfer level: Modified independent                    Balance Overall balance assessment: Mild deficits observed, not formally tested                                         ADL either performed or assessed with clinical judgement   ADL Overall ADL's : Modified independent                                             Vision         Perception     Praxis      Pertinent Vitals/Pain Pain Assessment: 0-10 Pain Score: 7  Pain Location: R shoulder, R foot Pain Descriptors / Indicators: Aching Pain Intervention(s): Monitored during session;Limited activity within patient's tolerance     Hand Dominance Right   Extremity/Trunk Assessment  Upper Extremity Assessment Upper Extremity Assessment: RUE deficits/detail RUE Deficits / Details: Able to achieve full PROM shoulder FF and ABD but painful. Pt able to acheive AROM ~90 degrees FF and 60 degrees ABD. Grossly 4/5 strength at shoulder   Lower Extremity Assessment Lower Extremity Assessment: Defer to PT evaluation   Cervical / Trunk Assessment Cervical / Trunk Assessment: Normal   Communication Communication Communication: No difficulties   Cognition Arousal/Alertness: Awake/alert Behavior During Therapy: WFL for tasks assessed/performed Overall Cognitive Status: Within Functional Limits for tasks assessed                                     General Comments       Exercises     Shoulder Instructions      Home Living Family/patient expects to be discharged to:: Private residence Living Arrangements: Spouse/significant other Available Help at Discharge: Family;Available PRN/intermittently Type of Home: House Home Access: Level entry     Home Layout:  One level     Bathroom Shower/Tub: Occupational psychologist: Standard     Home Equipment: Environmental consultant - 2 wheels;Shower seat          Prior Functioning/Environment Level of Independence: Independent                 OT Problem List: Decreased strength;Decreased range of motion;Impaired balance (sitting and/or standing);Pain      OT Treatment/Interventions: Self-care/ADL training;Therapeutic exercise;DME and/or AE instruction;Therapeutic activities;Patient/family education;Balance training    OT Goals(Current goals can be found in the care plan section) Acute Rehab OT Goals Patient Stated Goal: have surgery and get back home OT Goal Formulation: With patient Time For Goal Achievement: 06/10/17 Potential to Achieve Goals: Good  OT Frequency: Min 2X/week   Barriers to D/C:            Co-evaluation              AM-PAC PT "6 Clicks" Daily Activity     Outcome  Measure Help from another person eating meals?: None Help from another person taking care of personal grooming?: None Help from another person toileting, which includes using toliet, bedpan, or urinal?: None Help from another person bathing (including washing, rinsing, drying)?: None Help from another person to put on and taking off regular upper body clothing?: None Help from another person to put on and taking off regular lower body clothing?: None 6 Click Score: 24   End of Session    Activity Tolerance: Patient tolerated treatment well Patient left: in bed;with call bell/phone within reach  OT Visit Diagnosis: Pain Pain - Right/Left: Right Pain - part of body: Shoulder;Ankle and joints of foot                Time: 1165-7903 OT Time Calculation (min): 10 min Charges:  OT General Charges $OT Visit: 1 Visit OT Evaluation $OT Eval Low Complexity: 1 Low G-Codes:     Blakeleigh Domek A. Ulice Brilliant, M.S., OTR/L Pager: [place] 05/27/2017, 2:44 PM

## 2017-05-28 ENCOUNTER — Encounter (HOSPITAL_COMMUNITY)
Admission: EM | Disposition: A | Discharge status: Home Health | Payer: Self-pay | Source: Home / Self Care | Attending: Oncology

## 2017-05-28 ENCOUNTER — Inpatient Hospital Stay (HOSPITAL_COMMUNITY): Payer: BLUE CROSS/BLUE SHIELD | Admitting: Anesthesiology

## 2017-05-28 DIAGNOSIS — E1149 Type 2 diabetes mellitus with other diabetic neurological complication: Secondary | ICD-10-CM

## 2017-05-28 DIAGNOSIS — M869 Osteomyelitis, unspecified: Secondary | ICD-10-CM

## 2017-05-28 DIAGNOSIS — E1165 Type 2 diabetes mellitus with hyperglycemia: Secondary | ICD-10-CM

## 2017-05-28 DIAGNOSIS — L089 Local infection of the skin and subcutaneous tissue, unspecified: Secondary | ICD-10-CM

## 2017-05-28 DIAGNOSIS — M86371 Chronic multifocal osteomyelitis, right ankle and foot: Secondary | ICD-10-CM

## 2017-05-28 DIAGNOSIS — E11628 Type 2 diabetes mellitus with other skin complications: Secondary | ICD-10-CM

## 2017-05-28 DIAGNOSIS — K5900 Constipation, unspecified: Secondary | ICD-10-CM

## 2017-05-28 HISTORY — PX: AMPUTATION: SHX166

## 2017-05-28 LAB — GLUCOSE, CAPILLARY
Glucose-Capillary: 90 mg/dL (ref 65–99)
Glucose-Capillary: 95 mg/dL (ref 65–99)
Glucose-Capillary: 102 mg/dL — ABNORMAL HIGH (ref 65–99)
Glucose-Capillary: 107 mg/dL — ABNORMAL HIGH (ref 65–99)
Glucose-Capillary: 228 mg/dL — ABNORMAL HIGH (ref 65–99)
Glucose-Capillary: 147 mg/dL — ABNORMAL HIGH (ref 65–99)

## 2017-05-28 LAB — CBC
HCT: 31 % — ABNORMAL LOW (ref 39.0–52.0)
Hemoglobin: 9.9 g/dL — ABNORMAL LOW (ref 13.0–17.0)
MCH: 27.4 pg (ref 26.0–34.0)
MCHC: 31.9 g/dL (ref 30.0–36.0)
MCV: 85.9 fL (ref 78.0–100.0)
PLATELETS: 344 10*3/uL (ref 150–400)
RBC: 3.61 MIL/uL — ABNORMAL LOW (ref 4.22–5.81)
RDW: 13.5 % (ref 11.5–15.5)
WBC: 12.3 10*3/uL — ABNORMAL HIGH (ref 4.0–10.5)

## 2017-05-28 LAB — BASIC METABOLIC PANEL
Anion gap: 8 (ref 5–15)
BUN: 16 mg/dL (ref 6–20)
CALCIUM: 8.2 mg/dL — AB (ref 8.9–10.3)
CHLORIDE: 104 mmol/L (ref 101–111)
CO2: 25 mmol/L (ref 22–32)
CREATININE: 1.3 mg/dL — AB (ref 0.61–1.24)
GFR calc Af Amer: >60 mL/min (ref >60)
GFR calc non Af Amer: >60 mL/min (ref >60)
GLUCOSE: 102 mg/dL — AB (ref 65–99)
Potassium: 3.7 mmol/L (ref 3.5–5.1)
Sodium: 137 mmol/L (ref 135–145)

## 2017-05-28 SURGERY — AMPUTATION DIGIT
Anesthesia: Monitor Anesthesia Care | Site: Foot | Laterality: Right

## 2017-05-28 MED ORDER — BISACODYL 10 MG RE SUPP: 10.0000 mg | Freq: Every day | RECTAL | Status: DC | PRN

## 2017-05-28 MED ORDER — ONDANSETRON HCL 4 MG PO TABS: 4.0000 mg | ORAL_TABLET | Freq: Four times a day (QID) | ORAL | Status: DC | PRN

## 2017-05-28 MED ORDER — FENTANYL CITRATE (PF) 100 MCG/2ML IJ SOLN: 25.0000 ug | INTRAMUSCULAR | Status: DC | PRN

## 2017-05-28 MED ORDER — 0.9 % SODIUM CHLORIDE (POUR BTL) OPTIME
TOPICAL | Status: DC | PRN
  Administered 2017-05-28: 1000 mL

## 2017-05-28 MED ORDER — DOCUSATE SODIUM 100 MG PO CAPS
100.0000 mg | ORAL_CAPSULE | Freq: Two times a day (BID) | ORAL | Status: DC
  Administered 2017-05-28 – 2017-05-29 (×2): 100 mg via ORAL
  Filled 2017-05-28 (×2): qty 1

## 2017-05-28 MED ORDER — METHOCARBAMOL 500 MG PO TABS
500.0000 mg | ORAL_TABLET | Freq: Four times a day (QID) | ORAL | Status: DC | PRN
  Administered 2017-05-28: 500 mg via ORAL
  Filled 2017-05-28: qty 1

## 2017-05-28 MED ORDER — SODIUM CHLORIDE 0.9 % IV SOLN
INTRAVENOUS | Status: DC
  Administered 2017-05-28: 17:00:00 via INTRAVENOUS

## 2017-05-28 MED ORDER — FENTANYL CITRATE (PF) 250 MCG/5ML IJ SOLN
INTRAMUSCULAR | Status: AC
  Filled 2017-05-28: qty 5

## 2017-05-28 MED ORDER — ONDANSETRON HCL 4 MG/2ML IJ SOLN
INTRAMUSCULAR | Status: DC | PRN
  Administered 2017-05-28: 4 mg via INTRAVENOUS

## 2017-05-28 MED ORDER — BUPIVACAINE-EPINEPHRINE (PF) 0.5% -1:200000 IJ SOLN
INTRAMUSCULAR | Status: DC | PRN
  Administered 2017-05-28: 30 mL via PERINEURAL

## 2017-05-28 MED ORDER — LACTATED RINGERS IV SOLN
INTRAVENOUS | Status: DC
  Administered 2017-05-28 (×3): via INTRAVENOUS

## 2017-05-28 MED ORDER — METHOCARBAMOL 1000 MG/10ML IJ SOLN
500.0000 mg | Freq: Four times a day (QID) | INTRAVENOUS | Status: DC | PRN
  Filled 2017-05-28: qty 5

## 2017-05-28 MED ORDER — METOCLOPRAMIDE HCL 5 MG PO TABS: 5.0000 mg | ORAL_TABLET | Freq: Three times a day (TID) | ORAL | Status: DC | PRN

## 2017-05-28 MED ORDER — POLYETHYLENE GLYCOL 3350 17 G PO PACK: 17.0000 g | PACK | Freq: Every day | ORAL | Status: DC | PRN

## 2017-05-28 MED ORDER — INSULIN ASPART 100 UNIT/ML ~~LOC~~ SOLN
0.0000 [IU] | Freq: Three times a day (TID) | SUBCUTANEOUS | Status: DC
  Administered 2017-05-29 (×2): 2 [IU] via SUBCUTANEOUS

## 2017-05-28 MED ORDER — MAGNESIUM CITRATE PO SOLN: 1.0000 | Freq: Once | ORAL | Status: DC | PRN

## 2017-05-28 MED ORDER — ONDANSETRON HCL 4 MG/2ML IJ SOLN: 4.0000 mg | Freq: Four times a day (QID) | INTRAMUSCULAR | Status: DC | PRN

## 2017-05-28 MED ORDER — PROPOFOL 10 MG/ML IV BOLUS
INTRAVENOUS | Status: AC
Start: 2017-05-28 — End: ?
  Filled 2017-05-28: qty 20

## 2017-05-28 MED ORDER — MIDAZOLAM HCL 2 MG/2ML IJ SOLN
INTRAMUSCULAR | Status: AC
  Filled 2017-05-28: qty 2

## 2017-05-28 MED ORDER — FENTANYL CITRATE (PF) 250 MCG/5ML IJ SOLN
INTRAMUSCULAR | Status: DC | PRN
  Administered 2017-05-28 (×3): 50 ug via INTRAVENOUS

## 2017-05-28 MED ORDER — PROPOFOL 500 MG/50ML IV EMUL
INTRAVENOUS | Status: DC | PRN
  Administered 2017-05-28: 50 ug/kg/min via INTRAVENOUS

## 2017-05-28 MED ORDER — METOCLOPRAMIDE HCL 5 MG/ML IJ SOLN: 5.0000 mg | Freq: Three times a day (TID) | INTRAMUSCULAR | Status: DC | PRN

## 2017-05-28 MED ORDER — INSULIN ASPART 100 UNIT/ML ~~LOC~~ SOLN: 0.0000 [IU] | Freq: Every day | SUBCUTANEOUS | Status: DC

## 2017-05-28 MED ORDER — MIDAZOLAM HCL 2 MG/2ML IJ SOLN
INTRAMUSCULAR | Status: DC | PRN
  Administered 2017-05-28: 2 mg via INTRAVENOUS

## 2017-05-28 MED ORDER — PROPOFOL 10 MG/ML IV BOLUS
INTRAVENOUS | Status: DC | PRN
  Administered 2017-05-28: 20 mg via INTRAVENOUS

## 2017-05-28 SURGICAL SUPPLY — 30 items
BLADE SURG 21 STRL SS (BLADE) ×2 IMPLANT
BNDG CMPR 9X4 STRL LF SNTH (GAUZE/BANDAGES/DRESSINGS) ×1
BNDG COHESIVE 4X5 TAN STRL (GAUZE/BANDAGES/DRESSINGS) ×2 IMPLANT
BNDG ESMARK 4X9 LF (GAUZE/BANDAGES/DRESSINGS) ×1 IMPLANT
BNDG GAUZE ELAST 4 BULKY (GAUZE/BANDAGES/DRESSINGS) ×1 IMPLANT
COVER SURGICAL LIGHT HANDLE (MISCELLANEOUS) ×4 IMPLANT
DRAPE U-SHAPE 47X51 STRL (DRAPES) ×2 IMPLANT
DRSG ADAPTIC 3X8 NADH LF (GAUZE/BANDAGES/DRESSINGS) ×1 IMPLANT
DRSG PAD ABDOMINAL 8X10 ST (GAUZE/BANDAGES/DRESSINGS) ×1 IMPLANT
DURAPREP 26ML APPLICATOR (WOUND CARE) ×2 IMPLANT
ELECT REM PT RETURN 9FT ADLT (ELECTROSURGICAL) ×2
ELECTRODE REM PT RTRN 9FT ADLT (ELECTROSURGICAL) ×1 IMPLANT
GAUZE SPONGE 4X4 12PLY STRL (GAUZE/BANDAGES/DRESSINGS) IMPLANT
GAUZE SPONGE 4X4 12PLY STRL LF (GAUZE/BANDAGES/DRESSINGS) ×1 IMPLANT
GLOVE BIOGEL PI IND STRL 9 (GLOVE) ×1 IMPLANT
GLOVE BIOGEL PI INDICATOR 9 (GLOVE) ×1
GLOVE SURG ORTHO 9.0 STRL STRW (GLOVE) ×3 IMPLANT
GOWN STRL REUS W/ TWL XL LVL3 (GOWN DISPOSABLE) ×2 IMPLANT
GOWN STRL REUS W/TWL XL LVL3 (GOWN DISPOSABLE) ×4
KIT BASIN OR (CUSTOM PROCEDURE TRAY) ×2 IMPLANT
KIT ROOM TURNOVER OR (KITS) ×2 IMPLANT
MANIFOLD NEPTUNE II (INSTRUMENTS) ×2 IMPLANT
NEEDLE 22X1 1/2 (OR ONLY) (NEEDLE) IMPLANT
NS IRRIG 1000ML POUR BTL (IV SOLUTION) ×2 IMPLANT
PACK ORTHO EXTREMITY (CUSTOM PROCEDURE TRAY) ×2 IMPLANT
PAD ABD 8X10 STRL (GAUZE/BANDAGES/DRESSINGS) ×1 IMPLANT
PAD ARMBOARD 7.5X6 YLW CONV (MISCELLANEOUS) ×4 IMPLANT
SUT ETHILON 2 0 PSLX (SUTURE) ×2 IMPLANT
SYR CONTROL 10ML LL (SYRINGE) IMPLANT
TOWEL OR 17X26 10 PK STRL BLUE (TOWEL DISPOSABLE) ×2 IMPLANT

## 2017-05-28 NOTE — Progress Notes (Signed)
Pharmacy Antibiotic Note  [name] is a 47 y.o. male admitted on 05/25/2017 with osteomyelitis.  Pharmacy has been consulted for vancomycin and cefepime dosing. SCr 1.28 with CrCl ~ 70 ml/min.  Vancomycin trough goal 15-20  Plan: Continue vancomycin 1g IV q12 Continue cefepime 1g IV q8 Monitor clinical picture, renal function, VT prn F/U C&S, abx deescalation / LOT  24 hr postop abx. Stop dates put in place for tomorrow  Height: 5\' 4"  (162.6 cm) Weight: 184 lb 4.9 oz (83.6 kg) IBW/kg (Calculated) : 59.2  Temp (24hrs), Avg:98.3 F (36.8 C), Min:97.3 F (36.3 C), Max:99.1 F (37.3 C)  Recent Labs  Lab 05/25/17 0827 05/25/17 0840 05/25/17 1142 05/26/17 0517 05/27/17 0510 05/28/17 0340  WBC 11.7*  --   --  11.8* 12.6* 12.3*  CREATININE 1.28*  --   --  1.36* 1.33* 1.30*  LATICACIDVEN  --  0.74 0.72  --   --   --     Estimated Creatinine Clearance: 68.6 mL/min (A) (by C-G formula based on SCr of 1.3 mg/dL (H)).    Allergies  Allergen Reactions  . Bee Venom Swelling    SWELLING REACTION UNSPECIFIED   . Penicillins Hives and Other (See Comments)    PATIENT HAS HAD A PCN REACTION WITH IMMEDIATE RASH, FACIAL/TONGUE/THROAT SWELLING, SOB, OR LIGHTHEADEDNESS WITH HYPOTENSION:  #  #  #  YES  #  #  #   Has patient had a PCN reaction causing severe rash involving mucus membranes or skin necrosis: No Has patient had a PCN reaction that required hospitalization: No Has patient had a PCN reaction occurring within the last 10 years: No Allergy Severity noted prior to 05/28/17     Thank you for allowing pharmacy to be a part of this patient's care.  [name] 05/28/2017 1:20 PM

## 2017-05-28 NOTE — Anesthesia Postprocedure Evaluation (Signed)
Anesthesia Post Note  Patient: [name]  Procedure(s) Performed: AMPUTATION 1st & 3rd TOE (Right Foot)     Patient location during evaluation: PACU Anesthesia Type: Regional and MAC Level of consciousness: awake and alert Pain management: pain level controlled Vital Signs Assessment: post-procedure vital signs reviewed and stable Respiratory status: spontaneous breathing, nonlabored ventilation, respiratory function stable and patient connected to nasal cannula oxygen Cardiovascular status: stable and blood pressure returned to baseline Postop Assessment: no apparent nausea or vomiting Anesthetic complications: no    Last Vitals:  Vitals:   05/28/17 1130 05/28/17 1145  BP: (!) 143/86 (!) 148/84  Pulse: 75 76  Resp: 16 18  Temp:  (!) 36.3 C  SpO2: 99% 99%    Last Pain:  Vitals:   05/28/17 1130  TempSrc:   PainSc: 0-No pain                 Kailand Seda

## 2017-05-28 NOTE — Op Note (Signed)
05/28/2017  11:14 AM  PATIENT:  [name]    PRE-OPERATIVE DIAGNOSIS:  OSTJO 1st and 2nd toes  POST-OPERATIVE DIAGNOSIS:  Same  PROCEDURE:  AMPUTATION 1st & 3rd TOE  SURGEON:  [name], MD  PHYSICIAN ASSISTANT:None ANESTHESIA:   General  PREOPERATIVE INDICATIONS:  [name] is a  47 y.o. male with a diagnosis of OSTJO 1st and 2nd toes who failed conservative measures and elected for surgical management.    The risks benefits and alternatives were discussed with the patient preoperatively including but not limited to the risks of infection, bleeding, nerve injury, cardiopulmonary complications, the need for revision surgery, among others, and the patient was willing to proceed.  OPERATIVE IMPLANTS: None.  @ENCIMAGES @  OPERATIVE FINDINGS: No deep abscess.  OPERATIVE PROCEDURE: Patient was brought the operating room after undergoing a regional block.  After adequate levels of anesthesia obtained patient's right lower extremity was prepped using DuraPrep draped into a sterile field a timeout was called.  A racquet incision was made just distal to the MTP joint of the great toe and third toe.  The great toe and third toe were amputated through the MTP joint.  Electrocautery was used for hemostasis the wound was irrigated with normal saline there was no signs of abscess there was minimal petechial bleeding.  The incisions were closed with 2-0 nylon.  A sterile compressive dressing was applied patient was taken the PACU in stable condition.   DISCHARGE PLANNING:  Antibiotic duration: 24 hours postoperatively  Weightbearing: Nonweightbearing on the right  Pain medication: As per protocol  Dressing care/ Wound VAC: Continue dry dressing until follow-up in the office in 2 weeks.  Ambulatory devices: Walker or crutches  Discharge to: Home.  Follow-up: In the office 1 week post operative.

## 2017-05-28 NOTE — Progress Notes (Signed)
    Subjective: [name] was seen laying in his bed eating lunch after amputation of right 1st and 3rd digits. No apparent complications during procedure. He feels well and is without complaint.   Objective:  Vital signs in last 24 hours: Vitals:   05/28/17 1103 05/28/17 1115 05/28/17 1130 05/28/17 1145  BP: 139/77 140/81 (!) 143/86 (!) 148/84  Pulse: 76 78 75 76  Resp: 16 16 16 18   Temp: (!) 97.3 F (36.3 C)   (!) 97.3 F (36.3 C)  TempSrc:      SpO2: 100% 100% 99% 99%  Weight:      Height:       Physical Exam  Constitutional: He appears well-developed and well-nourished. No distress.  HENT:  Head: Normocephalic and atraumatic.  Cardiovascular: Normal rate, regular rhythm and normal heart sounds.  Respiratory: Effort normal and breath sounds normal. No respiratory distress. He has no wheezes.  GI: Soft. Bowel sounds are normal. He exhibits no distension. There is no tenderness.  Musculoskeletal: He exhibits no edema.  Neurological: He is alert.  Skin: Skin is warm and dry. He is not diaphoretic.  S/p amputation of right 1st and 3rd digit. Wrapped in clean dry bandages and is without foul odor.   Psychiatric: He has a normal mood and affect. His behavior is normal. Judgment and thought content normal.   Assessment/Plan: [name] is a 47yo male with uncontrolled diabetes mellitus type 2, htn, and history of osteomyelitis of left great toe s/p in first toe amputation presented with one month history of right great toe pain. Right foot xray showed osteomyelitis of first and third distal phalanges and is now s/p amputation of the affected digits 3/2. Blood cultures are negative and he will remain on an additional 24 hours of antibiotics.  Osteomyelitis of right 1st and 3rd distal phalanges, S/p Amputations 3/2.  Had amputation of both 1st and 3rd digits earlier this morning. Blood cultures continue to remain negative and will require an additional 24 hours of parenteral  antibiotics. He does not seem to require much pain medication.  -Appreciate ortho recs -Expect patient will be ready for discharge within the next day or so -Continue Oxycodone 5mg  q4h PRN for pain -Senna BID PRN for constipation -Continue vancomycin and cefepime (2/27 ->present) for an additional 24 hrs -CBC in AM  Right shoulder pain PT recs outpatient PT which will be ordered on dc.  -Tylenol PRN for mild pain  Diabetes mellitus Patient's last A1c was 9.8 in May 2018.  The patient states that he stopped all his medication a few months ago.  He seems to be doing well with sliding scale insulin.  -CBG monitoring q4h -Will choose appropriate med based on insulin requirements here in hospital -He has an appointment to establish with a PCP 3/29  AKI Cr unchanged at 1.3. Baseline seems to be around 1.  -Bmp in AM  HTN Continue amlodipine 10mg  daily, lisinopril 20mg  daily, and metoprolol 25mg  BID  HLD Continue atorvastatin 20mg  daily  Dispo: Anticipated discharge home tomorrow.   Einar Gip, DO Internal Medicine PGY2 ZLDJT:[phone] 05/28/2017, 1:24 PM

## 2017-05-28 NOTE — Interval H&P Note (Signed)
History and Physical Interval Note:  05/28/2017 7:33 AM  [name]  has presented today for surgery, with the diagnosis of OSTJO 1st and 2nd toes  The various methods of treatment have been discussed with the patient and family. After consideration of risks, benefits and other options for treatment, the patient has consented to  Procedure(s): AMPUTATION 1st & 3rd TOE (Right) as a surgical intervention .  The patient's history has been reviewed, patient examined, no change in status, stable for surgery.  I have reviewed the patient's chart and labs.  Questions were answered to the patient's satisfaction.     [name]

## 2017-05-28 NOTE — Progress Notes (Signed)
Orthopedic Tech Progress Note Patient Details:  [name] 23-Aug-1970 [id number]  Ortho Devices Type of Ortho Device: Postop shoe/boot Ortho Device/Splint Location: rle Ortho Device/Splint Interventions: Application   Post Interventions Patient Tolerated: Well Instructions Provided: Care of device   [name] 05/28/2017, 1:03 PM

## 2017-05-28 NOTE — Anesthesia Preprocedure Evaluation (Signed)
Anesthesia Evaluation  Patient identified by MRN, date of birth, ID band Patient awake    Reviewed: Allergy & Precautions, NPO status , Patient's Chart, lab work & pertinent test results, reviewed documented beta blocker date and time   History of Anesthesia Complications Negative for: history of anesthetic complications  Airway Mallampati: II  TM Distance: >3 FB Neck ROM: Full    Dental  (+) Poor Dentition, Loose, Missing, Chipped, Dental Advisory Given   Pulmonary COPD, Current Smoker,    breath sounds clear to auscultation       Cardiovascular hypertension, Pt. on medications and Pt. on home beta blockers (-) angina+ CAD  (-) Past MI and (-) CHF  Rhythm:Regular Rate:Normal  '16 Dobutamine stress: Normal left ventricular function and global wall motion with stress. Global  LV function is preserved with stress, EF 60-65% '16 ECHO: EF 55-60%, wall motion and valves OK   Neuro/Psych PSYCHIATRIC DISORDERS Depression negative neurological ROS     GI/Hepatic Neg liver ROS, GERD  Medicated and Controlled,  Endo/Other  diabetes, Insulin Dependent, Oral Hypoglycemic Agents  Renal/GU negative Renal ROS     Musculoskeletal   Abdominal   Peds  Hematology   Anesthesia Other Findings   Reproductive/Obstetrics                             Anesthesia Physical Anesthesia Plan  ASA: III  Anesthesia Plan: MAC and Regional   Post-op Pain Management:    Induction:   PONV Risk Score and Plan: 0  Airway Management Planned: Nasal Cannula  Additional Equipment:   Intra-op Plan:   Post-operative Plan:   Informed Consent: I have reviewed the patients History and Physical, chart, labs and discussed the procedure including the risks, benefits and alternatives for the proposed anesthesia with the patient or authorized representative who has indicated his/her understanding and acceptance.     Plan  Discussed with: CRNA and Surgeon  Anesthesia Plan Comments:         Anesthesia Quick Evaluation

## 2017-05-28 NOTE — Transfer of Care (Signed)
Immediate Anesthesia Transfer of Care Note  Patient: [name]  Procedure(s) Performed: AMPUTATION 1st & 3rd TOE (Right Foot)  Patient Location: PACU  Anesthesia Type:MAC  Level of Consciousness: awake, alert  and oriented  Airway & Oxygen Therapy: Patient Spontanous Breathing and Patient connected to nasal cannula oxygen  Post-op Assessment: Report given to RN and Post -op Vital signs reviewed and stable  Post vital signs: Reviewed and stable  Last Vitals:  Vitals:   05/28/17 0809 05/28/17 1103  BP: (!) 165/99 (P) 139/77  Pulse: 74 (P) 76  Resp:  (P) 16  Temp: 36.7 C (!) (P) 36.3 C  SpO2:  (P) 100%    Last Pain:  Vitals:   05/28/17 1103  TempSrc:   PainSc: (P) 0-No pain      Patients Stated Pain Goal: 4 (77/11/65 [id number])  Complications: No apparent anesthesia complications

## 2017-05-28 NOTE — Anesthesia Procedure Notes (Signed)
Anesthesia Regional Block: Popliteal block   Pre-Anesthetic Checklist: ,, timeout performed, Correct Patient, Correct Site, Correct Laterality, Correct Procedure, Correct Position, site marked, Risks and benefits discussed,  Surgical consent,  Pre-op evaluation,  At surgeon's request and post-op pain management  Laterality: Lower and Right  Prep: chloraprep       Needles:  Injection technique: Single-shot  Needle Type: Echogenic Stimulator Needle          Additional Needles:   Procedures:, nerve stimulator,,, ultrasound used (permanent image in chart),,,,   Nerve Stimulator or Paresthesia:  Response: plantar, 0.4 mA,   Additional Responses:   Narrative:  Start time: 05/28/2017 9:51 AM End time: 05/28/2017 9:54 AM Injection made incrementally with aspirations every 5 mL.  Performed by: Personally  Anesthesiologist: Oleta Mouse, MD  Additional Notes: H+P and labs reviewed, risks and benefits discussed with patient, procedure tolerated well without complications

## 2017-05-29 ENCOUNTER — Encounter (HOSPITAL_COMMUNITY): Payer: Self-pay | Admitting: Orthopedic Surgery

## 2017-05-29 DIAGNOSIS — Z89421 Acquired absence of other right toe(s): Secondary | ICD-10-CM

## 2017-05-29 DIAGNOSIS — Z89411 Acquired absence of right great toe: Secondary | ICD-10-CM

## 2017-05-29 DIAGNOSIS — Z91120 Patient's intentional underdosing of medication regimen due to financial hardship: Secondary | ICD-10-CM

## 2017-05-29 LAB — GLUCOSE, CAPILLARY
Glucose-Capillary: 124 mg/dL — ABNORMAL HIGH (ref 65–99)
Glucose-Capillary: 143 mg/dL — ABNORMAL HIGH (ref 65–99)

## 2017-05-29 MED ORDER — METFORMIN HCL ER (MOD) 1000 MG PO TB24: 1000.0000 mg | ORAL_TABLET | Freq: Two times a day (BID) | ORAL | 1 refills | Status: DC

## 2017-05-29 MED ORDER — PHENOL 1.4 % MT LIQD
1.0000 | OROMUCOSAL | Status: DC | PRN
  Administered 2017-05-29: 1 via OROMUCOSAL
  Filled 2017-05-29: qty 177

## 2017-05-29 MED ORDER — OXYCODONE HCL 5 MG PO TABS: 5.0000 mg | ORAL_TABLET | ORAL | 0 refills | Status: DC | PRN

## 2017-05-29 MED ORDER — CANAGLIFLOZIN 100 MG PO TABS: 100.0000 mg | ORAL_TABLET | Freq: Every day | ORAL | 0 refills | Status: DC

## 2017-05-29 NOTE — Care Management (Signed)
RW ordered from [facility] to be delivered prior to d/c.

## 2017-05-29 NOTE — Progress Notes (Signed)
   Subjective: Mr. Min was seen resting in his bed this morning. He stated that his pain was well controlled. He does not have any sob or chest pain. He stated that he had some throat irritation secondary to have been intubated during amputation procedure.   Objective:  Vital signs in last 24 hours:       Vitals:   05/28/17 2128 05/28/17 2151 05/29/17 0203 05/29/17 0456  BP: (!) 145/74 (!) 145/74 138/75 140/64  Pulse: 87 87 79 76  Resp: 16  17 17   Temp: 99 F (37.2 C)  98.4 F (36.9 C) 98.4 F (36.9 C)  TempSrc: Oral  Oral Oral  SpO2: 99%  100% 100%  Weight:      Height:       Physical Exam  Constitutional: He appears well-developed and well-nourished. No distress.  HENT:  Head: Normocephalic.  Eyes: Conjunctivae are normal.  Cardiovascular: Normal rate, regular rhythm and normal heart sounds.  Respiratory: Effort normal and breath sounds normal. No respiratory distress. He has no wheezes.  GI: Soft. Bowel sounds are normal. He exhibits no distension. There is no tenderness.  Musculoskeletal:  Patient's right 1st and 3rd toes have been amputated and are bandaged. Unable to see site of amputation due to dressing.   Neurological: He is alert.  Skin: He is not diaphoretic.  Psychiatric: He has a normal mood and affect. His behavior is normal. Judgment and thought content normal.   Assessment/Plan:  Mr. Mullaly is a 47yo male withDiabetes mellitus type 2, htn, and history of osteomyelitis of left great toe s/p in first toe amputation presented with one month history of right great toe pain. Right foot xray showedosteomyelitis of first and third distal phalanges.  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 He received 24 hrs of antibiotics post operatively with cefepime and vancomycin. The patient received a total of 5 days  antibiotics (2/27-3/3). He is afebrile and his pain is well controlled.   -ContinueOxycodone 5mg  q4h PRN for pain -Senna BID PRN for constipation -Blood culture showing no growth for 3 days -Follow up with orthopedics outpatient in 2 weeks  Right shoulder pain Thought to be possible shoulder impingement.   - Tylenol PRN for mild pain - Oxycodone 5mg  q4h PRN for severe pain - Will follow with  PTrecommended outpatient PT to continue working with patient  Diabetesmellitus Patient's last A1c was 9.8 in May 2018.Patient's glucose has ranged 102-124 over the past 24 hours.  - CBG monitoringq4h -We will counsel the patient on importance of good blood glucose control in order to prevent further infections.  AKI The patient's last cr was 1.30 yesterday and it continues to downtrend from 1.36>1.33. The patient's baseline 0.9-1.2  - Avoid nephrotoxic agents - Bmpin AM  HTN His blood pressure has ranged 136-165/84-99  -Continue amlodipine 10mg  daily, lisinopril 20mg  daily, and metoprolol 25mg  BID  HLD -Continue atorvastatin 20mg  daily   Dispo: Anticipated discharge today.   Lars Mage, MD 05/29/2017, 11:22 AM Pager: [phone]

## 2017-05-29 NOTE — Progress Notes (Signed)
Medicine attending: Clinical status and database reviewed with resident physician Dr. Lars Mage and I concur with her evaluation and management plan which we discussed together.  Now 24 hours post amputation of 2 toes right foot for diabetic ulcers with associated osteomyelitis.  He is afebrile on antibiotics.  Now that we have source control, antibiotics can be discontinued.  Plan discharge today if okay with orthopedic surgery.  He will continue to follow-up with his primary care physician and with orthopedic surgery.

## 2017-05-29 NOTE — Care Management Note (Signed)
Case Management Note  Patient Details  Name: [name] MRN: [id number] Date of Birth: Jul 08, 1970  Subjective/Objective:  Presented with R great toe pain/ osteomyelitis of first and third distal phalanges.    S/p amputation of right 1st and 3rd digit,3/2  PCP: Dr.A. Plotnikov  Action/Plan: Transition to home with home health services (PT) to follow. Pt states has transportation to home.  Expected Discharge Date:  05/29/17               Expected Discharge Plan:  [place]  In-House Referral:     Discharge planning Services  CM Consult  Post Acute Care Choice:    Choice offered to:  Patient  DME Arranged:    DME Agency:     HH Arranged:  PT [place]:  [place], pending MD's order. Order requested per NCM.  Status of Service:  Completed, signed off  If discussed at [place] of Stay Meetings, dates discussed:    Additional Comments:  Sharin Mons, RN 05/29/2017, 1:56 PM

## 2017-05-29 NOTE — Discharge Instructions (Signed)
It was a pleasure to take care of you [name]. During your hospitalization you were taken care of for diabetic foot infection. It is very important that you maintain good control of your blood glucose. It is for this reason that we have started you on the following medications  -Metformin XR 1000mg  twice daily  -Invokana 100mg  daily before breakfast   Please follow up with your primary care physician within 1-2 weeks to follow up on your blood glucose measurements and also orthopedic surgeon in 2 weeks. Please call to schedule appointments.   Bone and Joint Infections, Adult Bone infections (osteomyelitis) and joint infections (septic arthritis) occur when bacteria or other germs get inside a bone or a joint. This can happen if you have an infection in another part of your body that spreads through your blood. Germs from your skin or from outside of your body can also cause this type of infection if you have a wound or a broken bone (fracture) that breaks the skin. Anyone can get a bone infection or joint infection. You may be more likely to get this type of infection if you have a condition, such as diabetes, that lowers your ability to fight infection or increases your chances of getting an infection. Bone and joint infections can cause damage, and they can spread to other areas of your body. They need to be treated quickly. What are the causes? Most bone and joint infections are caused by bacteria. They can also be caused by other germs, such as viruses and funguses. What increases the risk? This condition is more likely to develop in:  People who recently had surgery, especially bone or joint surgery.  People who have a long-term (chronic) disease, such as: ? HIV (human immunodeficiency virus). ? Diabetes. ? Rheumatoid arthritis. ? Sickle cell anemia.  Elderly people.  People who take medicines that block or weaken the bodys defense system (immune system).  People who have a  condition that reduces their blood flow.  People who are on kidney dialysis.  People who have an artificial joint.  People who have had a joint or bone repaired with plates or screws (surgical hardware).  People who use or abuse IV drugs.  People who have had trauma, such as stepping on a nail.  What are the signs or symptoms? Symptoms vary depending on the type and location of your infection. Common symptoms of bone and joint infections include:  Fever and chills.  Redness and warmth.  Swelling.  Pain and stiffness.  Drainage of fluid or pus near the infection.  Weight loss and fatigue.  Decreased ability to use a hand or foot.  How is this diagnosed? This condition may be diagnosed based on symptoms, medical history, a physical exam, and diagnostic tests. Tests can help to identify the cause of the infection. You may have various tests, such as:  A sample of tissue, fluid, or blood taken to be examined under a microscope.  A procedure to remove fluid from the infected joint with a needle (joint aspiration) for testing in a lab.  Pus or discharge swabbed from a wound for testing to identify germs and to determine what type of medicine will kill them (culture and sensitivity).  Blood tests to look for evidence of infection and inflammation (biomarkers).  Imaging studies to determine how severe the bone or joint infection is. These may include: ? X-rays. ? CT scan. ? MRI. ? Bone scan.  How is this treated? Treatment depends on  the cause and type of infection. Antibiotic medicines are usually the first treatment for a bone or joint infection. Treatment with antibiotics may include:  Getting IV antibiotics. This may be done in a hospital at first. You may have to continue IV antibiotics at home for several weeks. You may also have to take antibiotics by mouth for several weeks after that.  Taking more than one kind of antibiotic. Treatment may start with a type of  antibiotic that works against many different bacteria (broad spectrumantibiotics). IV antibiotics may be changed if tests show that another type may work better.  Other treatments may include:  Draining fluid from the joint by placing a needle into it (aspiration).  Surgery to remove: ? Dead or dying tissue from a bone or joint. ? An infected artificial joint. ? Infected plates or screws that were used to repair a broken bone.  Follow these instructions at home:  Take medicines only as directed by your health care provider.  Take your antibiotic medicine as directed by your health care provider. Finish the antibiotic even if you start to feel better.  Follow instructions from your health care provider about how to take IV antibiotics at home.  Ask your health care provider if you have any restrictions on your activities.  Keep all follow-up visits as directed by your health care provider. This is important. Contact a health care provider if:  You have a fever or chills.  You have redness, warmth, pain, or swelling that returns after treatment. Get help right away if:  You have rapid breathing or you have trouble breathing.  You have chest pain.  You cannot drink fluids or make urine.  The affected arm or leg swells, changes color, or turns blue. This information is not intended to replace advice given to you by your health care provider. Make sure you discuss any questions you have with your health care provider. Document Released: 03/15/2005 Document Revised: 08/21/2015 Document Reviewed: 03/13/2014 Elsevier Interactive Patient Education  [employer].

## 2017-05-30 ENCOUNTER — Telehealth: Payer: Self-pay | Admitting: *Deleted

## 2017-05-30 LAB — CULTURE, BLOOD (ROUTINE X 2)
Culture: NO GROWTH
Culture: NO GROWTH
SPECIAL REQUESTS: ADEQUATE
SPECIAL REQUESTS: ADEQUATE

## 2017-05-30 NOTE — Telephone Encounter (Signed)
Transition Care Management Follow-up Telephone Call   Date discharged? 05/29/17   How have you been since you were released from the hospital? Pt states he is doing ok. He had reschedule his appt til late becuase he did not know he was going to be d/c this early. Inform pt that's why I was calling to follow-up, and move hosp f/u appt up sooner    Do you understand why you were in the hospital? YES   Do you understand the discharge instructions? YES   Where were you discharged to? Home   Items Reviewed:  Medications reviewed: YES  Allergies reviewed: YES  Dietary changes reviewed: YES, diabetic and heart healthy  Referrals reviewed: No referral needed   Functional Questionnaire:   Activities of Daily Living (ADLs):   He states he are independent in the following: feeding, continence, grooming and toileting States he require assistance with the following: ambulation, bathing and hygiene and dressing   Any transportation issues/concerns?: NO   Any patient concerns? NO   Confirmed importance and date/time of follow-up visits scheduled YES, appt 05/31/17  Provider Appointment booked with Dr. Alain Marion  Confirmed with patient if condition begins to worsen call PCP or go to the ER.  Patient was given the office number and encouraged to call back with question or concerns.  : YES

## 2017-05-31 ENCOUNTER — Ambulatory Visit: Payer: Self-pay | Admitting: Internal Medicine

## 2017-05-31 ENCOUNTER — Ambulatory Visit: Payer: BLUE CROSS/BLUE SHIELD | Admitting: Internal Medicine

## 2017-06-01 ENCOUNTER — Ambulatory Visit (INDEPENDENT_AMBULATORY_CARE_PROVIDER_SITE_OTHER): Payer: BLUE CROSS/BLUE SHIELD | Admitting: Internal Medicine

## 2017-06-01 ENCOUNTER — Other Ambulatory Visit (INDEPENDENT_AMBULATORY_CARE_PROVIDER_SITE_OTHER): Payer: BLUE CROSS/BLUE SHIELD

## 2017-06-01 ENCOUNTER — Encounter: Payer: Self-pay | Admitting: Internal Medicine

## 2017-06-01 VITALS — BP 146/92 | HR 101 | Temp 98.7°F | Ht 64.0 in | Wt 187.0 lb

## 2017-06-01 DIAGNOSIS — I25119 Atherosclerotic heart disease of native coronary artery with unspecified angina pectoris: Secondary | ICD-10-CM | POA: Diagnosis not present

## 2017-06-01 DIAGNOSIS — D509 Iron deficiency anemia, unspecified: Secondary | ICD-10-CM

## 2017-06-01 DIAGNOSIS — E1159 Type 2 diabetes mellitus with other circulatory complications: Secondary | ICD-10-CM

## 2017-06-01 DIAGNOSIS — I10 Essential (primary) hypertension: Secondary | ICD-10-CM

## 2017-06-01 DIAGNOSIS — E1165 Type 2 diabetes mellitus with hyperglycemia: Secondary | ICD-10-CM

## 2017-06-01 DIAGNOSIS — Z23 Encounter for immunization: Secondary | ICD-10-CM

## 2017-06-01 DIAGNOSIS — I739 Peripheral vascular disease, unspecified: Secondary | ICD-10-CM

## 2017-06-01 DIAGNOSIS — F17200 Nicotine dependence, unspecified, uncomplicated: Secondary | ICD-10-CM

## 2017-06-01 DIAGNOSIS — Z9114 Patient's other noncompliance with medication regimen: Secondary | ICD-10-CM | POA: Diagnosis not present

## 2017-06-01 DIAGNOSIS — M86371 Chronic multifocal osteomyelitis, right ankle and foot: Secondary | ICD-10-CM | POA: Diagnosis not present

## 2017-06-01 DIAGNOSIS — E1142 Type 2 diabetes mellitus with diabetic polyneuropathy: Secondary | ICD-10-CM | POA: Diagnosis not present

## 2017-06-01 DIAGNOSIS — E13621 Other specified diabetes mellitus with foot ulcer: Secondary | ICD-10-CM

## 2017-06-01 DIAGNOSIS — H43391 Other vitreous opacities, right eye: Secondary | ICD-10-CM

## 2017-06-01 DIAGNOSIS — E559 Vitamin D deficiency, unspecified: Secondary | ICD-10-CM | POA: Diagnosis not present

## 2017-06-01 DIAGNOSIS — IMO0002 Reserved for concepts with insufficient information to code with codable children: Secondary | ICD-10-CM

## 2017-06-01 DIAGNOSIS — L97509 Non-pressure chronic ulcer of other part of unspecified foot with unspecified severity: Secondary | ICD-10-CM

## 2017-06-01 DIAGNOSIS — E1149 Type 2 diabetes mellitus with other diabetic neurological complication: Secondary | ICD-10-CM | POA: Diagnosis not present

## 2017-06-01 LAB — LIPID PANEL
Cholesterol: 93 mg/dL (ref 0–200)
HDL: 39.7 mg/dL (ref >39.00)
LDL Cholesterol: 43 mg/dL (ref 0–99)
NONHDL: 52.9
TRIGLYCERIDES: 51 mg/dL (ref 0.0–149.0)
Total CHOL/HDL Ratio: 2
VLDL: 10.2 mg/dL (ref 0.0–40.0)

## 2017-06-01 LAB — URINALYSIS, ROUTINE W REFLEX MICROSCOPIC
BILIRUBIN URINE: NEGATIVE
Ketones, ur: NEGATIVE
Leukocytes, UA: NEGATIVE
Nitrite: NEGATIVE
PH: 5 (ref 5.0–8.0)
Specific Gravity, Urine: >=1.03 — AB (ref 1.000–1.030)
URINE GLUCOSE: NEGATIVE
UROBILINOGEN UA: 1 (ref 0.0–1.0)

## 2017-06-01 LAB — TSH: TSH: 1.97 u[IU]/mL (ref 0.35–4.50)

## 2017-06-01 LAB — HEPATIC FUNCTION PANEL
ALBUMIN: 3.2 g/dL — AB (ref 3.5–5.2)
ALK PHOS: 91 U/L (ref 39–117)
ALT: 14 U/L (ref 0–53)
AST: 10 U/L (ref 0–37)
Bilirubin, Direct: 0.1 mg/dL (ref 0.0–0.3)
TOTAL PROTEIN: 7 g/dL (ref 6.0–8.3)
Total Bilirubin: 0.2 mg/dL (ref 0.2–1.2)

## 2017-06-01 LAB — MICROALBUMIN / CREATININE URINE RATIO
Creatinine,U: 182.9 mg/dL
MICROALB UR: 92 mg/dL — AB (ref 0.0–1.9)
Microalb Creat Ratio: 50.3 mg/g — ABNORMAL HIGH (ref 0.0–30.0)

## 2017-06-01 LAB — BASIC METABOLIC PANEL
BUN: 16 mg/dL (ref 6–23)
CALCIUM: 8.9 mg/dL (ref 8.4–10.5)
CO2: 30 meq/L (ref 19–32)
CREATININE: 1.06 mg/dL (ref 0.40–1.50)
Chloride: 103 mEq/L (ref 96–112)
GFR: 96.04 mL/min (ref >60.00)
GLUCOSE: 154 mg/dL — AB (ref 70–99)
Potassium: 3.9 mEq/L (ref 3.5–5.1)
SODIUM: 140 meq/L (ref 135–145)

## 2017-06-01 LAB — HEMOGLOBIN A1C: Hgb A1c MFr Bld: 11.3 % — ABNORMAL HIGH (ref 4.6–6.5)

## 2017-06-01 LAB — IBC PANEL
IRON: 38 ug/dL — AB (ref 42–165)
Saturation Ratios: 12.5 % — ABNORMAL LOW (ref 20.0–50.0)
Transferrin: 218 mg/dL (ref 212.0–360.0)

## 2017-06-01 LAB — PSA: PSA: 0.64 ng/mL (ref 0.10–4.00)

## 2017-06-01 LAB — VITAMIN B12: Vitamin B-12: 260 pg/mL (ref 211–911)

## 2017-06-01 LAB — VITAMIN D 25 HYDROXY (VIT D DEFICIENCY, FRACTURES): VITD: 2.63 ng/mL — ABNORMAL LOW (ref 30.00–100.00)

## 2017-06-01 LAB — FERRITIN: FERRITIN: 172 ng/mL (ref 22.0–322.0)

## 2017-06-01 LAB — IRON: IRON: 38 ug/dL — AB (ref 42–165)

## 2017-06-01 MED ORDER — METFORMIN HCL 500 MG PO TABS: 500.0000 mg | ORAL_TABLET | Freq: Two times a day (BID) | ORAL | 11 refills | Status: DC

## 2017-06-01 MED ORDER — METOPROLOL TARTRATE 25 MG PO TABS: 25.0000 mg | ORAL_TABLET | Freq: Two times a day (BID) | ORAL | 11 refills | Status: DC

## 2017-06-01 MED ORDER — ATORVASTATIN CALCIUM 20 MG PO TABS: 20.0000 mg | ORAL_TABLET | Freq: Every day | ORAL | 11 refills | Status: DC

## 2017-06-01 MED ORDER — LISINOPRIL 20 MG PO TABS: 20.0000 mg | ORAL_TABLET | Freq: Every day | ORAL | 11 refills | Status: DC

## 2017-06-01 MED ORDER — CITALOPRAM HYDROBROMIDE 20 MG PO TABS: 20.0000 mg | ORAL_TABLET | Freq: Every day | ORAL | 11 refills | Status: DC

## 2017-06-01 MED ORDER — GABAPENTIN 300 MG PO CAPS: 300.0000 mg | ORAL_CAPSULE | Freq: Three times a day (TID) | ORAL | 5 refills | Status: DC

## 2017-06-01 NOTE — Progress Notes (Signed)
Subjective:  Patient ID: [name], male    DOB: 1970-09-26  Age: 47 y.o. MRN: [id number]  CC: No chief complaint on file.   HPI [name] presents for a new pt visit DM2 x20 years, HTN, foot ulcers B w/osteomyelitis.  His medical problems have been unstable lately.  There has been an element of noncompliance due to financial and other reasons.  [name] has run out of his medicines.  He thinks he will be able to get them tomorrow after he gets his paycheck.   Outpatient Medications Prior to Visit  Medication Sig Dispense Refill  . aspirin EC 81 MG tablet Take 81 mg by mouth daily.    . blood glucose meter kit and supplies KIT Dispense based on patient and insurance preference. Use up to four times daily as directed. (FOR ICD-9 250.00, 250.01). 1 each 0  . canagliflozin (INVOKANA) 100 MG TABS tablet Take 1 tablet (100 mg total) by mouth daily before breakfast. 30 tablet 0  . citalopram (CELEXA) 20 MG tablet Take 1 tablet (20 mg total) by mouth daily. 30 tablet 0  . Cyanocobalamin (VITAMIN B-12 PO) Take 1 capsule by mouth daily.    Marland Kitchen ibuprofen (ADVIL,MOTRIN) 200 MG tablet Take 600 mg by mouth every 6 (six) hours as needed for moderate pain.    . methocarbamol (ROBAXIN) 500 MG tablet Take 1 tablet (500 mg total) by mouth 2 (two) times daily. 20 tablet 0  . nicotine (NICODERM CQ - DOSED IN MG/24 HOURS) 21 mg/24hr patch Place 1 patch (21 mg total) onto the skin daily. 28 patch 0  . nitroGLYCERIN (NITROSTAT) 0.4 MG SL tablet Place 1 tablet (0.4 mg total) under the tongue every 5 (five) minutes as needed for chest pain. 25 tablet 5  . oxyCODONE (OXY IR/ROXICODONE) 5 MG immediate release tablet Take 1 tablet (5 mg total) by mouth every 4 (four) hours as needed for severe pain. 10 tablet 0  . senna-docusate (SENOKOT-S) 8.6-50 MG tablet While taking oxycodone, take 1-2 pills twice daily as needed to have a bowel movement daily. 30 tablet 0  . sildenafil (REVATIO) 20 MG tablet Take 20 mg  by mouth daily as needed for erectile dysfunction.    Marland Kitchen amLODipine (NORVASC) 10 MG tablet Take 1 tablet (10 mg total) by mouth daily. 90 tablet 0  . atorvastatin (LIPITOR) 20 MG tablet Take 1 tablet (20 mg total) by mouth daily. 90 tablet 0  . gabapentin (NEURONTIN) 300 MG capsule Take 1 capsule (300 mg total) by mouth 3 (three) times daily. 270 capsule 0  . lisinopril (PRINIVIL,ZESTRIL) 20 MG tablet Take 1 tablet (20 mg total) by mouth daily. 90 tablet 0  . metFORMIN (GLUMETZA) 1000 MG (MOD) 24 hr tablet Take 1 tablet (1,000 mg total) by mouth 2 (two) times daily. (Patient not taking: Reported on 06/01/2017) 60 tablet 1  . metoprolol tartrate (LOPRESSOR) 25 MG tablet Take 1 tablet (25 mg total) by mouth 2 (two) times daily. 180 tablet 0   No facility-administered medications prior to visit.     ROS Review of Systems  Constitutional: Negative for appetite change, fatigue and unexpected weight change.  HENT: Negative for congestion, nosebleeds, sneezing, sore throat and trouble swallowing.   Eyes: Positive for visual disturbance. Negative for itching.  Respiratory: Negative for cough.   Cardiovascular: Negative for chest pain, palpitations and leg swelling.  Gastrointestinal: Negative for abdominal distention, blood in stool, diarrhea and nausea.  Genitourinary: Negative for frequency and hematuria.  Musculoskeletal: Positive for arthralgias and gait problem. Negative for back pain, joint swelling and neck pain.  Skin: Negative for rash.  Neurological: Negative for dizziness, tremors, speech difficulty and weakness.  Psychiatric/Behavioral: Negative for agitation, dysphoric mood and sleep disturbance. The patient is not nervous/anxious.     Objective:  BP (!) 146/92 (BP Location: Left Arm, Patient Position: Sitting, Cuff Size: Large)   Pulse (!) 101   Temp 98.7 F (37.1 C) (Oral)   Ht 5' 4" (1.626 m)   Wt 187 lb (84.8 kg)   SpO2 99%   BMI 32.10 kg/m   BP Readings from Last 3  Encounters:  06/01/17 (!) 146/92  05/29/17 (!) 142/85  04/25/17 (!) 165/113    Wt Readings from Last 3 Encounters:  06/01/17 187 lb (84.8 kg)  05/25/17 184 lb 4.9 oz (83.6 kg)  12/27/16 180 lb (81.6 kg)    Physical Exam  Constitutional: He is oriented to person, place, and time. He appears well-developed. No distress.  NAD  HENT:  Mouth/Throat: Oropharynx is clear and moist.  Eyes: Conjunctivae are normal. Pupils are equal, round, and reactive to light.  Neck: Normal range of motion. No JVD present. No thyromegaly present.  Cardiovascular: Normal rate, regular rhythm, normal heart sounds and intact distal pulses. Exam reveals no gallop and no friction rub.  No murmur heard. Pulmonary/Chest: Effort normal and breath sounds normal. No respiratory distress. He has no wheezes. He has no rales. He exhibits no tenderness.  Abdominal: Soft. Bowel sounds are normal. He exhibits no distension and no mass. There is no tenderness. There is no rebound and no guarding.  Musculoskeletal: Normal range of motion. He exhibits tenderness. He exhibits no edema.  Lymphadenopathy:    He has no cervical adenopathy.  Neurological: He is alert and oriented to person, place, and time. He has normal reflexes. No cranial nerve deficit. He exhibits normal muscle tone. He displays a negative Romberg sign. Coordination abnormal. Gait normal.  Skin: Skin is warm and dry. No rash noted.  Psychiatric: He has a normal mood and affect. His behavior is normal. Judgment and thought content normal.  L foot wounds are dressed  Lab Results  Component Value Date   WBC 12.3 (H) 05/28/2017   HGB 9.9 (L) 05/28/2017   HCT 31.0 (L) 05/28/2017   PLT 344 05/28/2017   GLUCOSE 102 (H) 05/28/2017   CHOL 126 09/01/2016   TRIG 79 09/01/2016   HDL 48 09/01/2016   LDLCALC 62 09/01/2016   ALT 14 (L) 05/25/2017   AST 14 (L) 05/25/2017   NA 137 05/28/2017   K 3.7 05/28/2017   CL 104 05/28/2017   CREATININE 1.30 (H) 05/28/2017     BUN 16 05/28/2017   CO2 25 05/28/2017   TSH 0.74 08/04/2012   INR 1.1 (H) 07/26/2013   HGBA1C 9.8 (H) 08/20/2016   MICROALBUR 5.7 (H) 08/04/2012    Dg Shoulder Right  Result Date: 05/25/2017 CLINICAL DATA:  Pain and decreased range of motion in the right shoulder for a few months. EXAM: RIGHT SHOULDER - 2+ VIEW COMPARISON:  04/22/2011 FINDINGS: Degenerative AC joint spurring that is directed superiorly. Chronic insertional type changes at the greater tuberosity. No glenohumeral narrowing or spurring. No fracture deformity or soft tissue calcification. IMPRESSION: 1. No acute finding or significant change from 2013. 2. AC joint spurring. Electronically Signed   By: Monte Fantasia M.D.   On: 05/25/2017 12:33   Dg Toe Great Right  Result Date: 05/25/2017 CLINICAL DATA:  Right great toe pain and wound. EXAM: RIGHT GREAT TOE COMPARISON:  Radiographs of December 02, 2013. FINDINGS: There is noted lytic destruction involving the distal tuft of the first distal phalanx consistent with osteomyelitis. Soft tissue gas is noted in the right first toe consistent with cellulitis. No radiopaque foreign body is noted. There is also noted lytic destruction involving the third distal phalanx with overlying soft tissue ulceration. IMPRESSION: Findings consistent with osteomyelitis involving first and third distal phalanges. Electronically Signed   By: Marijo Conception, M.D.   On: 05/25/2017 12:33    Assessment & Plan:   There are no diagnoses linked to this encounter. I am having Nat Math. Pro maintain his nitroGLYCERIN, Cyanocobalamin (VITAMIN B-12 PO), aspirin EC, sildenafil, atorvastatin, gabapentin, lisinopril, metoprolol tartrate, senna-docusate, blood glucose meter kit and supplies, nicotine, citalopram, amLODipine, ibuprofen, methocarbamol, oxyCODONE, metFORMIN, and canagliflozin.  No orders of the defined types were placed in this encounter.    Follow-up: No Follow-up on file.  Walker Kehr, MD

## 2017-06-01 NOTE — Assessment & Plan Note (Signed)
Recurrent B feet -- Dr Sharol Given S/p R toe amputation #1,3 04/2017 S/p L toe amp #1 2018

## 2017-06-01 NOTE — Assessment & Plan Note (Signed)
Labs

## 2017-06-01 NOTE — Assessment & Plan Note (Addendum)
1/2 PPD - discussed - she lost weight.  We discussed tobacco associated vascular complications etc.

## 2017-06-02 ENCOUNTER — Telehealth: Payer: Self-pay | Admitting: Internal Medicine

## 2017-06-02 DIAGNOSIS — Z0279 Encounter for issue of other medical certificate: Secondary | ICD-10-CM

## 2017-06-02 MED ORDER — FERROUS SULFATE 325 (65 FE) MG PO TABS: 325.0000 mg | ORAL_TABLET | Freq: Every day | ORAL | 3 refills | Status: DC

## 2017-06-02 MED ORDER — B COMPLEX PO TABS: 1.0000 | ORAL_TABLET | Freq: Every day | ORAL | 3 refills | Status: DC

## 2017-06-02 MED ORDER — VITAMIN D3 1.25 MG (50000 UT) PO CAPS: 1.0000 | ORAL_CAPSULE | ORAL | 3 refills | Status: DC

## 2017-06-02 NOTE — Telephone Encounter (Signed)
FMLA forms have been completed and placed in providers box for him to review and sign.

## 2017-06-02 NOTE — Telephone Encounter (Signed)
The corrections have been made and sent to employer.

## 2017-06-02 NOTE — Telephone Encounter (Signed)
Forms have been signed, copy sent to scan & charged for.   Patient informed his original is up front for pick up.

## 2017-06-02 NOTE — Telephone Encounter (Signed)
Pt called back to advise the dates are incorrect on the FMLA forms. They need the date he went into the hospital, which was 2/27. Pt states his return date says August, and he does not anticipate being out that long. Pt states he does a follow up in 2 weeks. So he says about 3/27 or so.  Pt asked if you can resend to fax:  [phone] Attn: Juliann Pulse  Also. Pt states they are going to fax the first papers back to you.

## 2017-06-02 NOTE — Telephone Encounter (Signed)
We had a misunderstanding on what the patient is needing. He has been out of work since 05/25/17. We can approve him to be out until his FU on 06/15/17. With a return date of 06/16/17. Patient has not been at his job long enough to be eligible for intermittent FMLA.   I will redo the forms and send them to employer again.

## 2017-06-03 ENCOUNTER — Encounter (HOSPITAL_COMMUNITY): Payer: Self-pay

## 2017-06-03 ENCOUNTER — Encounter: Payer: Self-pay | Admitting: Internal Medicine

## 2017-06-03 ENCOUNTER — Ambulatory Visit (HOSPITAL_COMMUNITY)
Admission: RE | Admit: 2017-06-03 | Discharge: 2017-06-03 | Disposition: A | Discharge status: Home / Self Care | Payer: BLUE CROSS/BLUE SHIELD | Source: Ambulatory Visit | Attending: Internal Medicine | Admitting: Internal Medicine

## 2017-06-03 DIAGNOSIS — L97509 Non-pressure chronic ulcer of other part of unspecified foot with unspecified severity: Secondary | ICD-10-CM | POA: Diagnosis not present

## 2017-06-03 DIAGNOSIS — M86371 Chronic multifocal osteomyelitis, right ankle and foot: Secondary | ICD-10-CM

## 2017-06-03 DIAGNOSIS — E13621 Other specified diabetes mellitus with foot ulcer: Secondary | ICD-10-CM

## 2017-06-03 DIAGNOSIS — I739 Peripheral vascular disease, unspecified: Secondary | ICD-10-CM | POA: Insufficient documentation

## 2017-06-03 DIAGNOSIS — E559 Vitamin D deficiency, unspecified: Secondary | ICD-10-CM | POA: Insufficient documentation

## 2017-06-03 NOTE — Assessment & Plan Note (Signed)
Obtain arterial Doppler lower extremity ultrasound to rule out peripheral arterial disease.  Will hold off Invokana for now Check vitamin B12 and vitamin D levels Risks associated with treatment noncompliance were discussed. Compliance was encouraged. Stop smoking

## 2017-06-03 NOTE — Assessment & Plan Note (Signed)
Obtain arterial Doppler lower extremity ultrasound to rule out peripheral arterial disease.  Will hold off Invokana for now  Risks associated with treatment noncompliance were discussed. Compliance was encouraged. Stop smoking

## 2017-06-03 NOTE — Assessment & Plan Note (Signed)
Start Vitamin D 50,000 units weekly

## 2017-06-03 NOTE — Assessment & Plan Note (Addendum)
Restart lisinopril, metoprolol, amlodipine, ASA Risks associated with treatment noncompliance were discussed. Compliance was encouraged. Stop smoking

## 2017-06-03 NOTE — Assessment & Plan Note (Signed)
Risks of noncompliance including extremity amputation, heart attack, stroke, blindness were all discussed.  Compliance encouraged.

## 2017-06-03 NOTE — Assessment & Plan Note (Addendum)
Restart lisinopril, metoprolol, amlodipine Risks associated with treatment noncompliance were discussed. Compliance was encouraged. Stop smoking

## 2017-06-06 ENCOUNTER — Telehealth: Payer: Self-pay | Admitting: Internal Medicine

## 2017-06-06 NOTE — Telephone Encounter (Signed)
Copied from Page [phone]. Topic: General - Other >> Jun 06, 2017  4:35 PM Cecelia Byars, NT wrote: Reason for CRM: [facility] called and needs verbal orders for home  health physical therapy  1 x time a week for 3 weeks

## 2017-06-06 NOTE — Telephone Encounter (Signed)
Sharla Kidney, PT with [facility] called to say "when I was out seeing the patient at 11 am this morning, his BP was 170/100. He denied any symptoms. He said he had taken all of his medicines about 1 hour before my arrival. I did not do any exercise, just teaching about his diet. I wanted to let the provider know about the BP reading." I advised this would be sent to the provider for review.

## 2017-06-07 NOTE — Telephone Encounter (Signed)
Notified Gerilynn @ [phone] ok verbal for PT.Marland KitchenJohny Chess

## 2017-06-08 ENCOUNTER — Encounter: Payer: Self-pay | Admitting: Internal Medicine

## 2017-06-08 ENCOUNTER — Ambulatory Visit (INDEPENDENT_AMBULATORY_CARE_PROVIDER_SITE_OTHER): Payer: BLUE CROSS/BLUE SHIELD

## 2017-06-08 ENCOUNTER — Other Ambulatory Visit (INDEPENDENT_AMBULATORY_CARE_PROVIDER_SITE_OTHER): Payer: Self-pay

## 2017-06-08 ENCOUNTER — Encounter (INDEPENDENT_AMBULATORY_CARE_PROVIDER_SITE_OTHER): Payer: Self-pay

## 2017-06-08 VITALS — Ht 64.0 in | Wt 187.0 lb

## 2017-06-08 DIAGNOSIS — IMO0002 Reserved for concepts with insufficient information to code with codable children: Secondary | ICD-10-CM

## 2017-06-08 NOTE — Progress Notes (Signed)
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 Surgical dressing has remained intact though it is wet today. Patient states that he tried to cover his foot with a bag in the shower but it did not work. The dressing is removed and there is slight maceration to the skin from the wet bandage. Incision is healing and well approximated and the stitches are intact. 4X4 and ace bandage has been applied and advised pt to change this dressing daily. Additional supplies have been given to the pt.  A letter has been given to pt to advise to remain out of work pending his post op evaluation with Dr. Sharol Given next week.  They will discuss his return to work plan at that time. He is very concerned stating his employer is expecting him to return on 06/16/17. Advised pt to weight bear through heel, change dressing daily and to call with any questions or concerns. Pt voiced understanding and will update with any changes.   Tammra Pressman, [place], [employer]

## 2017-06-09 NOTE — Telephone Encounter (Signed)
Spoke with pt and he stated he is doing well today but has not has his BP checked since

## 2017-06-15 ENCOUNTER — Encounter: Payer: Self-pay | Admitting: Internal Medicine

## 2017-06-15 ENCOUNTER — Ambulatory Visit (INDEPENDENT_AMBULATORY_CARE_PROVIDER_SITE_OTHER): Payer: BLUE CROSS/BLUE SHIELD | Admitting: Internal Medicine

## 2017-06-15 ENCOUNTER — Ambulatory Visit (INDEPENDENT_AMBULATORY_CARE_PROVIDER_SITE_OTHER): Payer: BLUE CROSS/BLUE SHIELD | Admitting: Orthopedic Surgery

## 2017-06-15 DIAGNOSIS — E538 Deficiency of other specified B group vitamins: Secondary | ICD-10-CM | POA: Insufficient documentation

## 2017-06-15 DIAGNOSIS — IMO0002 Reserved for concepts with insufficient information to code with codable children: Secondary | ICD-10-CM

## 2017-06-15 DIAGNOSIS — E1149 Type 2 diabetes mellitus with other diabetic neurological complication: Secondary | ICD-10-CM

## 2017-06-15 DIAGNOSIS — E113319 Type 2 diabetes mellitus with moderate nonproliferative diabetic retinopathy with macular edema, unspecified eye: Secondary | ICD-10-CM

## 2017-06-15 DIAGNOSIS — E1159 Type 2 diabetes mellitus with other circulatory complications: Secondary | ICD-10-CM

## 2017-06-15 DIAGNOSIS — E1165 Type 2 diabetes mellitus with hyperglycemia: Secondary | ICD-10-CM | POA: Diagnosis not present

## 2017-06-15 DIAGNOSIS — I10 Essential (primary) hypertension: Secondary | ICD-10-CM

## 2017-06-15 DIAGNOSIS — E13621 Other specified diabetes mellitus with foot ulcer: Secondary | ICD-10-CM

## 2017-06-15 DIAGNOSIS — L97509 Non-pressure chronic ulcer of other part of unspecified foot with unspecified severity: Secondary | ICD-10-CM | POA: Diagnosis not present

## 2017-06-15 DIAGNOSIS — E559 Vitamin D deficiency, unspecified: Secondary | ICD-10-CM | POA: Diagnosis not present

## 2017-06-15 NOTE — Assessment & Plan Note (Signed)
On B12 

## 2017-06-15 NOTE — Assessment & Plan Note (Signed)
back on Metformin ADA diet

## 2017-06-15 NOTE — Assessment & Plan Note (Signed)
On Vit D now 

## 2017-06-15 NOTE — Patient Instructions (Signed)
Hemoglobin A1c Test Some of the sugar (glucose) that circulates in your blood sticks or binds to blood proteins. Hemoglobin (Hb or Hgb) is one type of blood protein that glucose binds to. It also carries oxygen in the red blood cells (RBCs). When glucose binds to Hb, the glucose-coated Hb is called glycated Hb. Once Hb is glycated, it remains that way for the life of the RBC. This is about 120 days. Rather than testing your blood glucose level on one single day, the hemoglobin A1c (HbA1c) test measures the average amount of glycated hemoglobin and, therefore, the average amount of glucose in your blood during the 3-4 months just before the test is done. The HbA1c test is used to monitor long-term control of blood sugar in people who have diabetes mellitus. The HbA1c test can also be used in addition to or in combination with fasting blood glucose level and oral glucose tolerance tests. What do the results mean? It is your responsibility to obtain your test results. Ask the lab or department performing the test when and how you will get your results. Contact your health care provider to discuss any questions you have about your results. Range of Normal Values Ranges for normal values may vary among different labs and hospitals. You should always check with your health care provider after having lab work or other tests done to discuss the meaning of your test results and whether your values are considered within normal limits. The ranges for normal HbA1c test results are as follows:  Adult or child without diabetes: 4-5.9%.  Adult or child with diabetes and good blood glucose control: less than 6.5%.  Several factors can affect HbA1c test results. These may include:  Diseases (hemoglobinopathies) that cause a change in the shape, size, or amount of Hb in your blood.  Longer than normal RBC life span.  Abnormally low levels of certain proteins in your blood.  Eating foods or taking supplements that  are high in vitamin C (ascorbic acid).  Meaning of Results Outside Normal Value Ranges Abnormally high HbA1c values are most commonly an indication of prediabetes mellitus and diabetes mellitus:  An HbA1c result of 5.7-6.4% is considered diagnostic of prediabetes mellitus.  An HbA1c result of 6.5% or higher on two separate occasions is considered diagnostic of diabetes mellitus.  Abnormally low HbA1c values can be caused by several health conditions. These may include:  Pregnancy.  A large amount of blood loss.  Blood transfusions.  Low red blood cell count (anemia). This is caused by premature destruction of red blood cells.  Long-term kidney failure.  Some unusual forms of Hb (Hb variants), such as sickle cell trait.  Discuss your test results with your health care provider. He or she will use the results to make a diagnosis and determine a treatment plan that is right for you. Talk with your health care provider to discuss your results, treatment options, and if necessary, the need for more tests. Talk with your health care provider if you have any questions about your results. This information is not intended to replace advice given to you by your health care provider. Make sure you discuss any questions you have with your health care provider. Document Released: 04/06/2004 Document Revised: 12/10/2015 Document Reviewed: 07/30/2013 Elsevier Interactive Patient Education  2018 [employer]. Diabetes Mellitus and Nutrition When you have diabetes (diabetes mellitus), it is very important to have healthy eating habits because your blood sugar (glucose) levels are greatly affected by what you eat  and drink. Eating healthy foods in the appropriate amounts, at about the same times every day, can help you:  Control your blood glucose.  Lower your risk of heart disease.  Improve your blood pressure.  Reach or maintain a healthy weight.  Every person with diabetes is different, and  each person has different needs for a meal plan. Your health care provider may recommend that you work with a diet and nutrition specialist (dietitian) to make a meal plan that is best for you. Your meal plan may vary depending on factors such as:  The calories you need.  The medicines you take.  Your weight.  Your blood glucose, blood pressure, and cholesterol levels.  Your activity level.  Other health conditions you have, such as heart or kidney disease.  How do carbohydrates affect me? Carbohydrates affect your blood glucose level more than any other type of food. Eating carbohydrates naturally increases the amount of glucose in your blood. Carbohydrate counting is a method for keeping track of how many carbohydrates you eat. Counting carbohydrates is important to keep your blood glucose at a healthy level, especially if you use insulin or take certain oral diabetes medicines. It is important to know how many carbohydrates you can safely have in each meal. This is different for every person. Your dietitian can help you calculate how many carbohydrates you should have at each meal and for snack. Foods that contain carbohydrates include:  Bread, cereal, rice, pasta, and crackers.  Potatoes and corn.  Peas, beans, and lentils.  Milk and yogurt.  Fruit and juice.  Desserts, such as cakes, cookies, ice cream, and candy.  How does alcohol affect me? Alcohol can cause a sudden decrease in blood glucose (hypoglycemia), especially if you use insulin or take certain oral diabetes medicines. Hypoglycemia can be a life-threatening condition. Symptoms of hypoglycemia (sleepiness, dizziness, and confusion) are similar to symptoms of having too much alcohol. If your health care provider says that alcohol is safe for you, follow these guidelines:  Limit alcohol intake to no more than 1 drink per day for nonpregnant women and 2 drinks per day for men. One drink equals 12 oz of beer, 5 oz of  wine, or 1 oz of hard liquor.  Do not drink on an empty stomach.  Keep yourself hydrated with water, diet soda, or unsweetened iced tea.  Keep in mind that regular soda, juice, and other mixers may contain a lot of sugar and must be counted as carbohydrates.  What are tips for following this plan? Reading food labels  Start by checking the serving size on the label. The amount of calories, carbohydrates, fats, and other nutrients listed on the label are based on one serving of the food. Many foods contain more than one serving per package.  Check the total grams (g) of carbohydrates in one serving. You can calculate the number of servings of carbohydrates in one serving by dividing the total carbohydrates by 15. For example, if a food has 30 g of total carbohydrates, it would be equal to 2 servings of carbohydrates.  Check the number of grams (g) of saturated and trans fats in one serving. Choose foods that have low or no amount of these fats.  Check the number of milligrams (mg) of sodium in one serving. Most people should limit total sodium intake to less than 2,300 mg per day.  Always check the nutrition information of foods labeled as "low-fat" or "nonfat". These foods may be higher in  added sugar or refined carbohydrates and should be avoided.  Talk to your dietitian to identify your daily goals for nutrients listed on the label. Shopping  Avoid buying canned, premade, or processed foods. These foods tend to be high in fat, sodium, and added sugar.  Shop around the outside edge of the grocery store. This includes fresh fruits and vegetables, bulk grains, fresh meats, and fresh dairy. Cooking  Use low-heat cooking methods, such as baking, instead of high-heat cooking methods like deep frying.  Cook using healthy oils, such as olive, canola, or sunflower oil.  Avoid cooking with butter, cream, or high-fat meats. Meal planning  Eat meals and snacks regularly, preferably at the  same times every day. Avoid going long periods of time without eating.  Eat foods high in fiber, such as fresh fruits, vegetables, beans, and whole grains. Talk to your dietitian about how many servings of carbohydrates you can eat at each meal.  Eat 4-6 ounces of lean protein each day, such as lean meat, chicken, fish, eggs, or tofu. 1 ounce is equal to 1 ounce of meat, chicken, or fish, 1 egg, or 1/4 cup of tofu.  Eat some foods each day that contain healthy fats, such as avocado, nuts, seeds, and fish. Lifestyle   Check your blood glucose regularly.  Exercise at least 30 minutes 5 or more days each week, or as told by your health care provider.  Take medicines as told by your health care provider.  Do not use any products that contain nicotine or tobacco, such as cigarettes and e-cigarettes. If you need help quitting, ask your health care provider.  Work with a Social worker or diabetes educator to identify strategies to manage stress and any emotional and social challenges. What are some questions to ask my health care provider?  Do I need to meet with a diabetes educator?  Do I need to meet with a dietitian?  What number can I call if I have questions?  When are the best times to check my blood glucose? Where to find more information:  American Diabetes Association: diabetes.org/food-and-fitness/food  Academy of Nutrition and Dietetics: PokerClues.dk  [employer] of Diabetes and Digestive and Kidney Diseases (NIH): ContactWire.be Summary  A healthy meal plan will help you control your blood glucose and maintain a healthy lifestyle.  Working with a diet and nutrition specialist (dietitian) can help you make a meal plan that is best for you.  Keep in mind that carbohydrates and alcohol have immediate effects on your blood glucose levels. It is  important to count carbohydrates and to use alcohol carefully. This information is not intended to replace advice given to you by your health care provider. Make sure you discuss any questions you have with your health care provider. Document Released: 12/10/2004 Document Revised: 04/19/2016 Document Reviewed: 04/19/2016 Elsevier Interactive Patient Education  [employer].

## 2017-06-15 NOTE — Assessment & Plan Note (Signed)
F/u w/Dr Sharol Given

## 2017-06-15 NOTE — Progress Notes (Signed)
Subjective:  Patient ID: [name], male    DOB: 1970/09/27  Age: 46 y.o. MRN: [id number]  CC: No chief complaint on file.   HPI [name] presents for CRF, DM, HTN, wound f/u  Outpatient Medications Prior to Visit  Medication Sig Dispense Refill  . aspirin EC 81 MG tablet Take 81 mg by mouth daily.    Marland Kitchen atorvastatin (LIPITOR) 20 MG tablet Take 1 tablet (20 mg total) by mouth daily. 30 tablet 11  . b complex vitamins tablet Take 1 tablet by mouth daily. 100 tablet 3  . blood glucose meter kit and supplies KIT Dispense based on patient and insurance preference. Use up to four times daily as directed. (FOR ICD-9 250.00, 250.01). 1 each 0  . Cholecalciferol (VITAMIN D3) 50000 units CAPS Take 1 capsule by mouth once a week. 12 capsule 3  . citalopram (CELEXA) 20 MG tablet Take 1 tablet (20 mg total) by mouth daily. 30 tablet 11  . Cyanocobalamin (VITAMIN B-12 PO) Take 1 capsule by mouth daily.    . ferrous sulfate 325 (65 FE) MG tablet Take 1 tablet (325 mg total) by mouth daily. 30 tablet 3  . gabapentin (NEURONTIN) 300 MG capsule Take 1 capsule (300 mg total) by mouth 3 (three) times daily. 90 capsule 5  . ibuprofen (ADVIL,MOTRIN) 200 MG tablet Take 600 mg by mouth every 6 (six) hours as needed for moderate pain.    Marland Kitchen lisinopril (PRINIVIL,ZESTRIL) 20 MG tablet Take 1 tablet (20 mg total) by mouth daily. 30 tablet 11  . metFORMIN (GLUCOPHAGE) 500 MG tablet Take 1 tablet (500 mg total) by mouth 2 (two) times daily with a meal. 60 tablet 11  . metoprolol tartrate (LOPRESSOR) 25 MG tablet Take 1 tablet (25 mg total) by mouth 2 (two) times daily. 60 tablet 11  . nitroGLYCERIN (NITROSTAT) 0.4 MG SL tablet Place 1 tablet (0.4 mg total) under the tongue every 5 (five) minutes as needed for chest pain. 25 tablet 5  . oxyCODONE (OXY IR/ROXICODONE) 5 MG immediate release tablet Take 1 tablet (5 mg total) by mouth every 4 (four) hours as needed for severe pain. 10 tablet 0  .  senna-docusate (SENOKOT-S) 8.6-50 MG tablet While taking oxycodone, take 1-2 pills twice daily as needed to have a bowel movement daily. 30 tablet 0  . sildenafil (REVATIO) 20 MG tablet Take 20 mg by mouth daily as needed for erectile dysfunction.     No facility-administered medications prior to visit.     ROS Review of Systems  Constitutional: Negative for appetite change, fatigue and unexpected weight change.  HENT: Negative for congestion, nosebleeds, sneezing, sore throat and trouble swallowing.   Eyes: Negative for itching and visual disturbance.  Respiratory: Negative for cough.   Cardiovascular: Negative for chest pain, palpitations and leg swelling.  Gastrointestinal: Negative for abdominal distention, blood in stool, diarrhea and nausea.  Genitourinary: Negative for frequency and hematuria.  Musculoskeletal: Positive for arthralgias and gait problem. Negative for back pain, joint swelling and neck pain.  Skin: Negative for rash.  Neurological: Negative for dizziness, tremors, speech difficulty and weakness.  Psychiatric/Behavioral: Negative for agitation, dysphoric mood and sleep disturbance. The patient is not nervous/anxious.     Objective:  BP (!) 136/92 (BP Location: Left Arm, Patient Position: Sitting, Cuff Size: Large)   Pulse 81   Temp 97.7 F (36.5 C) (Oral)   Ht _0  (1.626 m)   Wt 188 lb (85.3 kg)   SpO2  100%   BMI 32.27 kg/m   BP Readings from Last 3 Encounters:  06/15/17 (!) 136/92  06/01/17 (!) 146/92  05/29/17 (!) 142/85    Wt Readings from Last 3 Encounters:  06/15/17 188 lb (85.3 kg)  06/08/17 187 lb (84.8 kg)  06/01/17 187 lb (84.8 kg)    Physical Exam  Constitutional: He is oriented to person, place, and time. He appears well-developed. No distress.  NAD  HENT:  Mouth/Throat: Oropharynx is clear and moist.  Eyes: Conjunctivae are normal. Pupils are equal, round, and reactive to light.  Neck: Normal range of motion. No JVD present. No  thyromegaly present.  Cardiovascular: Normal rate, regular rhythm, normal heart sounds and intact distal pulses. Exam reveals no gallop and no friction rub.  No murmur heard. Pulmonary/Chest: Effort normal and breath sounds normal. No respiratory distress. He has no wheezes. He has no rales. He exhibits no tenderness.  Abdominal: Soft. Bowel sounds are normal. He exhibits no distension and no mass. There is no tenderness. There is no rebound and no guarding.  Musculoskeletal: Normal range of motion. He exhibits edema and tenderness.  Lymphadenopathy:    He has no cervical adenopathy.  Neurological: He is alert and oriented to person, place, and time. He has normal reflexes. No cranial nerve deficit. He exhibits normal muscle tone. He displays a negative Romberg sign. Coordination abnormal. Gait normal.  Skin: Skin is warm and dry. No rash noted.  Psychiatric: He has a normal mood and affect. His behavior is normal. Judgment and thought content normal.  L foot is dressed  Lab Results  Component Value Date   WBC 12.3 (H) 05/28/2017   HGB 9.9 (L) 05/28/2017   HCT 31.0 (L) 05/28/2017   PLT 344 05/28/2017   GLUCOSE 154 (H) 06/01/2017   CHOL 93 06/01/2017   TRIG 51.0 06/01/2017   HDL 39.70 06/01/2017   LDLCALC 43 06/01/2017   ALT 14 06/01/2017   AST 10 06/01/2017   NA 140 06/01/2017   K 3.9 06/01/2017   CL 103 06/01/2017   CREATININE 1.06 06/01/2017   BUN 16 06/01/2017   CO2 30 06/01/2017   TSH 1.97 06/01/2017   PSA 0.64 06/01/2017   INR 1.1 (H) 07/26/2013   HGBA1C 11.3 (H) 06/01/2017   MICROALBUR 92.0 (H) 06/01/2017    No results found.  Assessment & Plan:   There are no diagnoses linked to this encounter. I am having Nat Math. Hallberg maintain his nitroGLYCERIN, Cyanocobalamin (VITAMIN B-12 PO), aspirin EC, sildenafil, senna-docusate, blood glucose meter kit and supplies, ibuprofen, oxyCODONE, gabapentin, atorvastatin, lisinopril, metoprolol tartrate, citalopram, metFORMIN,  Vitamin D3, ferrous sulfate, and b complex vitamins.  No orders of the defined types were placed in this encounter.    Follow-up: No Follow-up on file.  Walker Kehr, MD

## 2017-06-15 NOTE — Assessment & Plan Note (Signed)
Risks including blindness, associated with DM treatment noncompliance were discussed. Compliance was encouraged.

## 2017-06-15 NOTE — Assessment & Plan Note (Addendum)
BP Readings from Last 3 Encounters:  06/15/17 (!) 136/92  06/01/17 (!) 146/92  05/29/17 (!) 142/85   May need to adjust lisinopril, metoprolol, amlodipine

## 2017-06-16 ENCOUNTER — Ambulatory Visit (INDEPENDENT_AMBULATORY_CARE_PROVIDER_SITE_OTHER): Payer: BLUE CROSS/BLUE SHIELD | Admitting: Orthopedic Surgery

## 2017-06-16 ENCOUNTER — Encounter (INDEPENDENT_AMBULATORY_CARE_PROVIDER_SITE_OTHER): Payer: Self-pay | Admitting: Orthopedic Surgery

## 2017-06-16 VITALS — Ht 64.0 in | Wt 188.0 lb

## 2017-06-16 DIAGNOSIS — Z89411 Acquired absence of right great toe: Secondary | ICD-10-CM

## 2017-06-16 DIAGNOSIS — S98111A Complete traumatic amputation of right great toe, initial encounter: Secondary | ICD-10-CM

## 2017-06-16 NOTE — Progress Notes (Signed)
Office Visit Note   Patient: [name]           Date of Birth: July 20, 1970           MRN: [id number] Visit Date: 06/16/2017              Requested by: Cassandria Anger, MD [place], [place] [address] PCP: Cassandria Anger, MD  Chief Complaint  Patient presents with  . Right Foot - Routine Post Op    05/28/17 right foot 1st and 3rd toe amputation       HPI: Patient is a 47 year old gentleman presents in follow-up status post right great toe and third toe amputation.  Patient also has amputation of the left great toe.  Patient states he feels well has no complaints.  Assessment & Plan: Visit Diagnoses:  1. Amputated great toe, right ([place])     Plan: Sutures harvested today patient is given a prescription for a biotech for extra-depth shoes and custom orthotics with peripheral vascular disease disease and diabetic insensate neuropathy.  Patient is status post partial foot amputations on both feet.  Patient would like to return to work tomorrow he was given a note to return to work.  Follow-Up Instructions: Return if symptoms worsen or fail to improve.   Ortho Exam  Patient is alert, oriented, no adenopathy, well-dressed, normal affect, normal respiratory effort. Examination patient has no swelling in his foot he does have dry cracked skin the incisions are well-healed.  The sutures are harvested.  Patient was given instructions and heel cord stretching and also recommended a knee-high 15-20 mm compression stocking.  Imaging: No results found. No images are attached to the encounter.  Labs: Lab Results  Component Value Date   HGBA1C 11.3 (H) 06/01/2017   HGBA1C 9.8 (H) 08/20/2016   HGBA1C 7.0 (H) 11/28/2012   ESRSEDRATE 54 (H) 08/20/2016   ESRSEDRATE 8 07/24/2012   ESRSEDRATE 4 04/19/2008   CRP 2.2 (H) 08/20/2016   REPTSTATUS 05/30/2017 FINAL 05/25/2017   GRAMSTAIN  07/24/2012    CYTOSPIN NO WBC SEEN NO ORGANISMS SEEN Gram Stain Report Called  to,Read Back By and Verified With: [id number]   CULT  05/25/2017    NO GROWTH 5 DAYS Performed at [facility], [place] [address], [place], [place] [address]     @[email or web address] [phone]  Body mass index is 32.27 kg/m.  Orders:  No orders of the defined types were placed in this encounter.  No orders of the defined types were placed in this encounter.    Procedures: No procedures performed  Clinical Data: No additional findings.  ROS:  All other systems negative, except as noted in the HPI. Review of Systems  Objective: Vital Signs: Ht 5\' 4"  (1.626 m)   Wt 188 lb (85.3 kg)   BMI 32.27 kg/m   Specialty Comments:  No specialty comments available.  PMFS History: Patient Active Problem List   Diagnosis Date Noted  . B12 deficiency 06/15/2017  . Vitamin D deficiency 06/03/2017  . Anemia 06/01/2017  . Diabetic foot infection ([place])   . Osteomyelitis ([place]) 05/25/2017  . Vitreous floaters of right eye 09/15/2016  . Non compliance w medication regimen 09/15/2016  . Depression 09/01/2016  . Foot ulcer due to secondary DM ([place]) 08/20/2016  . Coronary atherosclerosis of native coronary artery 08/17/2013  . Diabetic retinopathy ([place]) 02/02/2013  . Diabetic neuropathy ([place]) 12/01/2012  . Hypertension associated with diabetes ([place]) 08/03/2012  . Tobacco use disorder 08/03/2012  .  Erectile dysfunction 08/03/2012  . Diabetes mellitus with neurological manifestations, uncontrolled ([place]) 05/26/2006   Past Medical History:  Diagnosis Date  . Diabetes mellitus type II, uncontrolled ([place])   . Hyperlipidemia   . Hypertension   . Other and unspecified angina pectoris   . Tobacco use     Family History  Problem Relation Age of Onset  . Cancer Mother   . Diabetes Father   . Hypertension Father     Past Surgical History:  Procedure Laterality Date  . AMPUTATION Left 08/22/2016   Procedure: GREAT TOE AMPUTATION;  Surgeon: [name], MD;  Location:  [place];  Service: Orthopedics;  Laterality: Left;  . AMPUTATION Right 05/28/2017   Procedure: AMPUTATION 1st & 3rd TOE;  Surgeon: [name], MD;  Location: [place] [facility];  Service: Orthopedics;  Laterality: Right;  . LEFT HEART CATHETERIZATION WITH CORONARY ANGIOGRAM N/A 07/31/2013   Procedure: LEFT HEART CATHETERIZATION WITH CORONARY ANGIOGRAM;  Surgeon: Jettie Booze, MD;  Location: [facility] CATH LAB;  Service: Cardiovascular;  Laterality: N/A;  . spinal tap    . toe amputated     Social History   Occupational History  . Occupation: Leisure centre manager: [employer]  Tobacco Use  . Smoking status: Current Every Day Smoker    Packs/day: 1.00    Years: 20.00    Pack years: 20.00    Types: Cigarettes  . Smokeless tobacco: Never Used  . Tobacco comment: wants patches   Substance and Sexual Activity  . Alcohol use: No    Comment: occ  . Drug use: No  . Sexual activity: Not Currently

## 2017-06-24 ENCOUNTER — Encounter (HOSPITAL_COMMUNITY): Payer: BLUE CROSS/BLUE SHIELD

## 2017-06-24 ENCOUNTER — Ambulatory Visit: Payer: Self-pay | Admitting: Internal Medicine

## 2017-07-21 ENCOUNTER — Telehealth (INDEPENDENT_AMBULATORY_CARE_PROVIDER_SITE_OTHER): Payer: Self-pay | Admitting: Orthopedic Surgery

## 2017-07-21 NOTE — Telephone Encounter (Signed)
06/16/2017 OV Note refaxed to [facility] [phone]

## 2017-07-28 ENCOUNTER — Ambulatory Visit: Payer: BLUE CROSS/BLUE SHIELD | Admitting: Internal Medicine

## 2017-08-02 ENCOUNTER — Encounter: Payer: Self-pay | Admitting: Internal Medicine

## 2017-08-02 ENCOUNTER — Ambulatory Visit (INDEPENDENT_AMBULATORY_CARE_PROVIDER_SITE_OTHER): Payer: BLUE CROSS/BLUE SHIELD | Admitting: Internal Medicine

## 2017-08-02 DIAGNOSIS — E1159 Type 2 diabetes mellitus with other circulatory complications: Secondary | ICD-10-CM

## 2017-08-02 DIAGNOSIS — E559 Vitamin D deficiency, unspecified: Secondary | ICD-10-CM

## 2017-08-02 DIAGNOSIS — I10 Essential (primary) hypertension: Secondary | ICD-10-CM | POA: Diagnosis not present

## 2017-08-02 DIAGNOSIS — E13621 Other specified diabetes mellitus with foot ulcer: Secondary | ICD-10-CM | POA: Diagnosis not present

## 2017-08-02 DIAGNOSIS — E113319 Type 2 diabetes mellitus with moderate nonproliferative diabetic retinopathy with macular edema, unspecified eye: Secondary | ICD-10-CM | POA: Diagnosis not present

## 2017-08-02 DIAGNOSIS — N5201 Erectile dysfunction due to arterial insufficiency: Secondary | ICD-10-CM | POA: Diagnosis not present

## 2017-08-02 DIAGNOSIS — E538 Deficiency of other specified B group vitamins: Secondary | ICD-10-CM | POA: Diagnosis not present

## 2017-08-02 DIAGNOSIS — E1142 Type 2 diabetes mellitus with diabetic polyneuropathy: Secondary | ICD-10-CM

## 2017-08-02 DIAGNOSIS — L97509 Non-pressure chronic ulcer of other part of unspecified foot with unspecified severity: Secondary | ICD-10-CM | POA: Diagnosis not present

## 2017-08-02 DIAGNOSIS — F17200 Nicotine dependence, unspecified, uncomplicated: Secondary | ICD-10-CM | POA: Diagnosis not present

## 2017-08-02 MED ORDER — VARDENAFIL HCL 20 MG PO TABS: 20.0000 mg | ORAL_TABLET | Freq: Every day | ORAL | 6 refills | Status: DC | PRN

## 2017-08-02 NOTE — Assessment & Plan Note (Signed)
Healed

## 2017-08-02 NOTE — Assessment & Plan Note (Signed)
Will try Levitra (not to take w/NTG) Injections discussed

## 2017-08-02 NOTE — Patient Instructions (Signed)
One of the ED treatment options: Alprostadil intracavernosal injection What is this medicine? ALPROSTADIL (al PROS ta dil) is used to treat erectile dysfunction (ED). This medicine helps to create and maintain an erection. This medicine may be used for other purposes; ask your health care provider or pharmacist if you have questions. COMMON BRAND NAME(S): Caverject, Caverject Impulse, Edex What should I tell my health care provider before I take this medicine? They need to know if you have any of these conditions: -an abnormally formed penis -have been advised not to engage in sexual activity -have ever had a painful erection that lasted more than 4 hours -heart problems -leukemia -low blood pressure -penile implant -sickle cell disease or trait -tumor of the bone marrow (multiple myeloma) -an unusual or allergic reaction to alprostadil or other medicines, foods, dyes, or preservatives How should I use this medicine? This medicine is for injection into the penis. You will be taught how to use this medicine. Use exactly as directed. Do not take your medicine more often than directed. It is important that you put your used needles and syringes in a special sharps container. Do not put them in a trash can. If you do not have a sharps container, call your pharmacist or healthcare provider to get one. This medicine is for use in men only and is not for use in children. Overdosage: If you think you have taken too much of this medicine contact a poison control center or emergency room at once. NOTE: This medicine is only for you. Do not share this medicine with others. What if I miss a dose? You should not use this medicine more than 3 times a week. Each dose should be given at least 24 hours apart. What may interact with this medicine? -medicines for blood pressure This list may not describe all possible interactions. Give your health care provider a list of all the medicines, herbs,  non-prescription drugs, or dietary supplements you use. Also tell them if you smoke, drink alcohol, or use illegal drugs. Some items may interact with your medicine. What should I watch for while using this medicine? Contact your doctor or health care professional right away if you have an erection that lasts longer than 4 hours or if it becomes painful. This may be a sign of a serious problem and must be treated right away to prevent permanent damage. Do not change the dose of your medication. Call your doctor or health care professional to determine if your dose needs to be changed. This medicine does not protect you or your partner against HIV infection (the virus that causes AIDS) or other sexually transmitted diseases. What side effects may I notice from receiving this medicine? Side effects that you should report to your doctor or health care professional as soon as possible: -allergic reactions like skin rash, itching or hives, swelling of the face, lips, or tongue -prolonged or painful erection Side effects that usually do not require medical attention (report to your doctor or health care professional if they continue or are bothersome): -bleeding, bruising, or pain at site of injection -change in blood pressure This list may not describe all possible side effects. Call your doctor for medical advice about side effects. You may report side effects to FDA at 1-800-FDA-1088. Where should I keep my medicine? Keep out of reach of children. Caverject: Store unopened product at or below 25 degrees C (77 degrees F). Do not freeze. See product instructions for storage when product is in  use. Different products may have different instructions for storage. Throw away any unused medicine after the expiration date. Edex: Store unopened product at room temperature between 15 and 30 degrees C (59 and 86 degrees F). Do not freeze. See product instructions for storage when product is in use. Throw away any  unused medicine after the expiration date. When traveling, do not store this medicine in checked luggage during air travel or leave in a closed automobile. NOTE: This sheet is a summary. It may not cover all possible information. If you have questions about this medicine, talk to your doctor, pharmacist, or health care provider.  2018 Elsevier/Gold Standard (2014-08-14 13:19:52)

## 2017-08-02 NOTE — Assessment & Plan Note (Signed)
Vit D 

## 2017-08-02 NOTE — Assessment & Plan Note (Signed)
S/p surgery 

## 2017-08-02 NOTE — Progress Notes (Signed)
Subjective:  Patient ID: [name], male    DOB: 09/19/1970  Age: 47 y.o. MRN: [id number]  CC: No chief complaint on file.   HPI Alvie Fowles Delange presents for DM, dyslipidemia, HTN, neuropathy f/u. C/o ED CBGs 119  Outpatient Medications Prior to Visit  Medication Sig Dispense Refill  . aspirin EC 81 MG tablet Take 81 mg by mouth daily.    Marland Kitchen atorvastatin (LIPITOR) 20 MG tablet Take 1 tablet (20 mg total) by mouth daily. 30 tablet 11  . b complex vitamins tablet Take 1 tablet by mouth daily. 100 tablet 3  . blood glucose meter kit and supplies KIT Dispense based on patient and insurance preference. Use up to four times daily as directed. (FOR ICD-9 250.00, 250.01). 1 each 0  . Cholecalciferol (VITAMIN D3) 50000 units CAPS Take 1 capsule by mouth once a week. 12 capsule 3  . citalopram (CELEXA) 20 MG tablet Take 1 tablet (20 mg total) by mouth daily. 30 tablet 11  . Cyanocobalamin (VITAMIN B-12 PO) Take 1 capsule by mouth daily.    . ferrous sulfate 325 (65 FE) MG tablet Take 1 tablet (325 mg total) by mouth daily. 30 tablet 3  . gabapentin (NEURONTIN) 300 MG capsule Take 1 capsule (300 mg total) by mouth 3 (three) times daily. 90 capsule 5  . ibuprofen (ADVIL,MOTRIN) 200 MG tablet Take 600 mg by mouth every 6 (six) hours as needed for moderate pain.    Marland Kitchen lisinopril (PRINIVIL,ZESTRIL) 20 MG tablet Take 1 tablet (20 mg total) by mouth daily. 30 tablet 11  . metFORMIN (GLUCOPHAGE) 500 MG tablet Take 1 tablet (500 mg total) by mouth 2 (two) times daily with a meal. 60 tablet 11  . metoprolol tartrate (LOPRESSOR) 25 MG tablet Take 1 tablet (25 mg total) by mouth 2 (two) times daily. 60 tablet 11  . nitroGLYCERIN (NITROSTAT) 0.4 MG SL tablet Place 1 tablet (0.4 mg total) under the tongue every 5 (five) minutes as needed for chest pain. 25 tablet 5  . oxyCODONE (OXY IR/ROXICODONE) 5 MG immediate release tablet Take 1 tablet (5 mg total) by mouth every 4 (four) hours as needed for severe  pain. 10 tablet 0  . senna-docusate (SENOKOT-S) 8.6-50 MG tablet While taking oxycodone, take 1-2 pills twice daily as needed to have a bowel movement daily. 30 tablet 0  . sildenafil (REVATIO) 20 MG tablet Take 20 mg by mouth daily as needed for erectile dysfunction.     No facility-administered medications prior to visit.     ROS Review of Systems  Constitutional: Negative for appetite change, fatigue and unexpected weight change.  HENT: Negative for congestion, nosebleeds, sneezing, sore throat and trouble swallowing.   Eyes: Positive for visual disturbance. Negative for itching.  Respiratory: Negative for cough.   Cardiovascular: Negative for chest pain, palpitations and leg swelling.  Gastrointestinal: Negative for abdominal distention, blood in stool, diarrhea and nausea.  Genitourinary: Negative for frequency and hematuria.  Musculoskeletal: Positive for arthralgias, gait problem and myalgias. Negative for back pain and neck pain.  Skin: Negative for rash.  Neurological: Negative for dizziness, tremors, speech difficulty and weakness.  Psychiatric/Behavioral: Negative for agitation, dysphoric mood, sleep disturbance and suicidal ideas. The patient is not nervous/anxious.     Objective:  BP (!) 142/78 (BP Location: Left Arm, Patient Position: Sitting, Cuff Size: Large)   Pulse (!) 102   Temp 98.5 F (36.9 C) (Oral)   Ht 5' 4"  (1.626 m)   Wt  188 lb (85.3 kg)   SpO2 99%   BMI 32.27 kg/m   BP Readings from Last 3 Encounters:  08/02/17 (!) 142/78  06/15/17 (!) 136/92  06/01/17 (!) 146/92    Wt Readings from Last 3 Encounters:  08/02/17 188 lb (85.3 kg)  06/16/17 188 lb (85.3 kg)  06/15/17 188 lb (85.3 kg)    Physical Exam  Constitutional: He is oriented to person, place, and time. He appears well-developed. No distress.  NAD  HENT:  Mouth/Throat: Oropharynx is clear and moist.  Eyes: Pupils are equal, round, and reactive to light. Conjunctivae are normal.  Neck:  Normal range of motion. No JVD present. No thyromegaly present.  Cardiovascular: Normal rate, regular rhythm, normal heart sounds and intact distal pulses. Exam reveals no gallop and no friction rub.  No murmur heard. Pulmonary/Chest: Effort normal and breath sounds normal. No respiratory distress. He has no wheezes. He has no rales. He exhibits no tenderness.  Abdominal: Soft. Bowel sounds are normal. He exhibits no distension and no mass. There is no tenderness. There is no rebound and no guarding.  Musculoskeletal: Normal range of motion. He exhibits tenderness. He exhibits no edema.  Lymphadenopathy:    He has no cervical adenopathy.  Neurological: He is alert and oriented to person, place, and time. He has normal reflexes. No cranial nerve deficit. He exhibits normal muscle tone. He displays a negative Romberg sign. Coordination and gait normal.  Skin: Skin is warm and dry. No rash noted.  Psychiatric: He has a normal mood and affect. His behavior is normal. Judgment and thought content normal.   Calluces, amputated scars  Lab Results  Component Value Date   WBC 12.3 (H) 05/28/2017   HGB 9.9 (L) 05/28/2017   HCT 31.0 (L) 05/28/2017   PLT 344 05/28/2017   GLUCOSE 154 (H) 06/01/2017   CHOL 93 06/01/2017   TRIG 51.0 06/01/2017   HDL 39.70 06/01/2017   LDLCALC 43 06/01/2017   ALT 14 06/01/2017   AST 10 06/01/2017   NA 140 06/01/2017   K 3.9 06/01/2017   CL 103 06/01/2017   CREATININE 1.06 06/01/2017   BUN 16 06/01/2017   CO2 30 06/01/2017   TSH 1.97 06/01/2017   PSA 0.64 06/01/2017   INR 1.1 (H) 07/26/2013   HGBA1C 11.3 (H) 06/01/2017   MICROALBUR 92.0 (H) 06/01/2017    No results found.  Assessment & Plan:   There are no diagnoses linked to this encounter. I am having Nat Math. [place] maintain his nitroGLYCERIN, Cyanocobalamin (VITAMIN B-12 PO), aspirin EC, sildenafil, senna-docusate, blood glucose meter kit and supplies, ibuprofen, oxyCODONE, gabapentin, atorvastatin,  lisinopril, metoprolol tartrate, citalopram, metFORMIN, Vitamin D3, ferrous sulfate, and b complex vitamins.  No orders of the defined types were placed in this encounter.    Follow-up: No follow-ups on file.  Walker Kehr, MD

## 2017-08-02 NOTE — Assessment & Plan Note (Signed)
Gabapentin FMLA

## 2017-08-02 NOTE — Assessment & Plan Note (Signed)
BP Readings from Last 3 Encounters:  08/02/17 (!) 142/78  06/15/17 (!) 136/92  06/01/17 (!) 146/92

## 2017-08-02 NOTE — Assessment & Plan Note (Signed)
Trying to quit 

## 2017-08-02 NOTE — Assessment & Plan Note (Signed)
On B12 

## 2017-08-26 ENCOUNTER — Ambulatory Visit (INDEPENDENT_AMBULATORY_CARE_PROVIDER_SITE_OTHER): Payer: BLUE CROSS/BLUE SHIELD | Admitting: Internal Medicine

## 2017-08-26 ENCOUNTER — Encounter: Payer: Self-pay | Admitting: Internal Medicine

## 2017-08-26 ENCOUNTER — Other Ambulatory Visit (INDEPENDENT_AMBULATORY_CARE_PROVIDER_SITE_OTHER): Payer: BLUE CROSS/BLUE SHIELD

## 2017-08-26 VITALS — BP 176/98 | HR 97 | Temp 98.8°F | Ht 64.0 in | Wt 188.0 lb

## 2017-08-26 DIAGNOSIS — E08621 Diabetes mellitus due to underlying condition with foot ulcer: Secondary | ICD-10-CM | POA: Insufficient documentation

## 2017-08-26 DIAGNOSIS — D509 Iron deficiency anemia, unspecified: Secondary | ICD-10-CM

## 2017-08-26 DIAGNOSIS — E538 Deficiency of other specified B group vitamins: Secondary | ICD-10-CM | POA: Diagnosis not present

## 2017-08-26 DIAGNOSIS — E11621 Type 2 diabetes mellitus with foot ulcer: Secondary | ICD-10-CM

## 2017-08-26 DIAGNOSIS — L97512 Non-pressure chronic ulcer of other part of right foot with fat layer exposed: Secondary | ICD-10-CM

## 2017-08-26 DIAGNOSIS — L97502 Non-pressure chronic ulcer of other part of unspecified foot with fat layer exposed: Secondary | ICD-10-CM

## 2017-08-26 LAB — CBC WITH DIFFERENTIAL/PLATELET
Basophils Absolute: 0.1 10*3/uL (ref 0.0–0.1)
Basophils Relative: 0.7 % (ref 0.0–3.0)
EOS PCT: 2.4 % (ref 0.0–5.0)
Eosinophils Absolute: 0.3 10*3/uL (ref 0.0–0.7)
HCT: 34.1 % — ABNORMAL LOW (ref 39.0–52.0)
HEMOGLOBIN: 11.2 g/dL — AB (ref 13.0–17.0)
Lymphocytes Relative: 28 % (ref 12.0–46.0)
Lymphs Abs: 3.4 10*3/uL (ref 0.7–4.0)
MCHC: 33 g/dL (ref 30.0–36.0)
MCV: 83.4 fl (ref 78.0–100.0)
MONO ABS: 0.7 10*3/uL (ref 0.1–1.0)
MONOS PCT: 5.8 % (ref 3.0–12.0)
Neutro Abs: 7.7 10*3/uL (ref 1.4–7.7)
Neutrophils Relative %: 63.1 % (ref 43.0–77.0)
Platelets: 340 10*3/uL (ref 150.0–400.0)
RBC: 4.08 Mil/uL — AB (ref 4.22–5.81)
RDW: 15.6 % — ABNORMAL HIGH (ref 11.5–15.5)
WBC: 12.2 10*3/uL — ABNORMAL HIGH (ref 4.0–10.5)

## 2017-08-26 LAB — VITAMIN B12: VITAMIN B 12: 371 pg/mL (ref 211–911)

## 2017-08-26 MED ORDER — SULFAMETHOXAZOLE-TRIMETHOPRIM 800-160 MG PO TABS: 1.0000 | ORAL_TABLET | Freq: Two times a day (BID) | ORAL | 0 refills | Status: DC

## 2017-08-26 NOTE — Patient Instructions (Signed)
Please take all new medication as prescribed - the antibiotic (to [facility])  You will be contacted regarding the referral for: podiatry asap  You are given the work note  Ok to also use neosporin and gauze covering for now to help protect this  Please continue all other medications as before, and refills have been done if requested.  Please have the pharmacy call with any other refills you may need.  Please keep your appointments with your specialists as you may have planned  Please go to the LAB in the Basement (turn left off the elevator) for the tests to be done today  You will be contacted by phone if any changes need to be made immediately.  Otherwise, you will receive a letter about your results with an explanation, but please check with MyChart first.  Please remember to sign up for MyChart if you have not done so, as this will be important to you in the future with finding out test results, communicating by private email, and scheduling acute appointments online when needed.

## 2017-08-26 NOTE — Progress Notes (Signed)
Subjective:    Patient ID: [name], male    DOB: 29-May-1970, 47 y.o.   MRN: [id number]  HPI  Here to f/u right foot pain with long hx of uncontrolled DM, PCP is Dr Alain Marion, pt states aporox 1 yr ago became s/p left great toe amputation, then about 4 mo ago also with right great toe and middle toe amputations, seemed to heal ok, but now has much pain worsening to distal plantar foot he can feel despite his neuropathy, has had recently much standing at work, has a ? callous he thinks, wears the DM shoes at work. No fever or drainage.  Also has recent anemia, low b12 and iron, and has been taking his recommended replacement therapy.  Pt denies chest pain, increased sob or doe, wheezing, orthopnea, PND, increased LE swelling, palpitations, dizziness or syncope.  Pt denies polydipsia, polyuria Past Medical History:  Diagnosis Date  . Diabetes mellitus type II, uncontrolled ([place])   . Hyperlipidemia   . Hypertension   . Other and unspecified angina pectoris   . Tobacco use    Past Surgical History:  Procedure Laterality Date  . AMPUTATION Left 08/22/2016   Procedure: GREAT TOE AMPUTATION;  Surgeon: [name], MD;  Location: [place];  Service: Orthopedics;  Laterality: Left;  . AMPUTATION Right 05/28/2017   Procedure: AMPUTATION 1st & 3rd TOE;  Surgeon: [name], MD;  Location: [facility];  Service: Orthopedics;  Laterality: Right;  . EYE SURGERY    . LEFT HEART CATHETERIZATION WITH CORONARY ANGIOGRAM N/A 07/31/2013   Procedure: LEFT HEART CATHETERIZATION WITH CORONARY ANGIOGRAM;  Surgeon: Jettie Booze, MD;  Location: [facility] CATH LAB;  Service: Cardiovascular;  Laterality: N/A;  . spinal tap    . toe amputated      reports that he has been smoking cigarettes.  He has a 20.00 pack-year smoking history. He has never used smokeless tobacco. He reports that he does not drink alcohol or use drugs. family history includes Cancer in his mother; Diabetes in his father; Hypertension in his  father. Allergies  Allergen Reactions  . Bee Venom Swelling    SWELLING REACTION UNSPECIFIED   . Penicillins Hives and Other (See Comments)    PATIENT HAS HAD A PCN REACTION WITH IMMEDIATE RASH, FACIAL/TONGUE/THROAT SWELLING, SOB, OR LIGHTHEADEDNESS WITH HYPOTENSION:  #  #  #  YES  #  #  #   Has patient had a PCN reaction causing severe rash involving mucus membranes or skin necrosis: No Has patient had a PCN reaction that required hospitalization: No Has patient had a PCN reaction occurring within the last 10 years: No Allergy Severity noted prior to 05/28/17    Current Outpatient Medications on File Prior to Visit  Medication Sig Dispense Refill  . aspirin EC 81 MG tablet Take 81 mg by mouth daily.    Marland Kitchen atorvastatin (LIPITOR) 20 MG tablet Take 1 tablet (20 mg total) by mouth daily. 30 tablet 11  . b complex vitamins tablet Take 1 tablet by mouth daily. 100 tablet 3  . blood glucose meter kit and supplies KIT Dispense based on patient and insurance preference. Use up to four times daily as directed. (FOR ICD-9 250.00, 250.01). 1 each 0  . Cholecalciferol (VITAMIN D3) 50000 units CAPS Take 1 capsule by mouth once a week. 12 capsule 3  . citalopram (CELEXA) 20 MG tablet Take 1 tablet (20 mg total) by mouth daily. 30 tablet 11  . Cyanocobalamin (VITAMIN B-12  PO) Take 1 capsule by mouth daily.    . ferrous sulfate 325 (65 FE) MG tablet Take 1 tablet (325 mg total) by mouth daily. 30 tablet 3  . gabapentin (NEURONTIN) 300 MG capsule Take 1 capsule (300 mg total) by mouth 3 (three) times daily. 90 capsule 5  . ibuprofen (ADVIL,MOTRIN) 200 MG tablet Take 600 mg by mouth every 6 (six) hours as needed for moderate pain.    Marland Kitchen lisinopril (PRINIVIL,ZESTRIL) 20 MG tablet Take 1 tablet (20 mg total) by mouth daily. 30 tablet 11  . metFORMIN (GLUCOPHAGE) 500 MG tablet Take 1 tablet (500 mg total) by mouth 2 (two) times daily with a meal. 60 tablet 11  . metoprolol tartrate (LOPRESSOR) 25 MG tablet Take 1  tablet (25 mg total) by mouth 2 (two) times daily. 60 tablet 11  . nitroGLYCERIN (NITROSTAT) 0.4 MG SL tablet Place 1 tablet (0.4 mg total) under the tongue every 5 (five) minutes as needed for chest pain. 25 tablet 5  . oxyCODONE (OXY IR/ROXICODONE) 5 MG immediate release tablet Take 1 tablet (5 mg total) by mouth every 4 (four) hours as needed for severe pain. 10 tablet 0  . senna-docusate (SENOKOT-S) 8.6-50 MG tablet While taking oxycodone, take 1-2 pills twice daily as needed to have a bowel movement daily. 30 tablet 0  . sildenafil (REVATIO) 20 MG tablet Take 20 mg by mouth daily as needed for erectile dysfunction.    . vardenafil (LEVITRA) 20 MG tablet Take 1 tablet (20 mg total) by mouth daily as needed for erectile dysfunction. 20 tablet 6   No current facility-administered medications on file prior to visit.    Review of Systems  Constitutional: Negative for other unusual diaphoresis or sweats HENT: Negative for ear discharge or swelling Eyes: Negative for other worsening visual disturbances Respiratory: Negative for stridor or other swelling  Gastrointestinal: Negative for worsening distension or other blood Genitourinary: Negative for retention or other urinary change Musculoskeletal: Negative for other MSK pain or swelling Skin: Negative for color change or other new lesions Neurological: Negative for worsening tremors and other numbness  Psychiatric/Behavioral: Negative for worsening agitation or other fatigue All other system neg per pt    Objective:   Physical Exam BP (!) 176/98 Comment: Pt stated that he has not taken meds yet today  Pulse 97   Temp 98.8 F (37.1 C) (Oral)   Ht 5' 4"  (1.626 m)   Wt 188 lb (85.3 kg)   SpO2 98%   BMI 32.27 kg/m  VS noted,  Constitutional: Pt appears in NAD HENT: Head: NCAT.  Right Ear: External ear normal.  Left Ear: External ear normal.  Eyes: . Pupils are equal, round, and reactive to light. Conjunctivae and EOM are normal Nose:  without d/c or deformity Neck: Neck supple. Gross normal ROM Cardiovascular: Normal rate and regular rhythm.   Pulmonary/Chest: Effort normal and breath sounds without rales or wheezing.  Neurological: Pt is alert. At baseline orientation, motor grossly intact Skin: Skin is warm, has trace right pedal/ankle edema assoc with 1.5 cm distal plantar ulceration/callous about 5 mm depth but no drainage or red streaks, and foot o/w dorsalis pedis 1+  Psychiatric: Pt behavior is normal without agitation  No other exam findings  Lab Results  Component Value Date   WBC 12.3 (H) 05/28/2017   HGB 9.9 (L) 05/28/2017   HCT 31.0 (L) 05/28/2017   PLT 344 05/28/2017   GLUCOSE 154 (H) 06/01/2017   CHOL 93 06/01/2017  TRIG 51.0 06/01/2017   HDL 39.70 06/01/2017   LDLCALC 43 06/01/2017   ALT 14 06/01/2017   AST 10 06/01/2017   NA 140 06/01/2017   K 3.9 06/01/2017   CL 103 06/01/2017   CREATININE 1.06 06/01/2017   BUN 16 06/01/2017   CO2 30 06/01/2017   TSH 1.97 06/01/2017   PSA 0.64 06/01/2017   INR 1.1 (H) 07/26/2013   HGBA1C 11.3 (H) 06/01/2017   MICROALBUR 92.0 (H) 06/01/2017       Assessment & Plan:

## 2017-08-27 NOTE — Assessment & Plan Note (Signed)
For f/u lab today 

## 2017-08-27 NOTE — Assessment & Plan Note (Signed)
For f/u cbc and iron today

## 2017-08-27 NOTE — Assessment & Plan Note (Signed)
With possible underlying infection, for antibx course, off work x 1 wk, refer podiatry

## 2017-08-29 ENCOUNTER — Telehealth: Payer: Self-pay | Admitting: Internal Medicine

## 2017-08-29 NOTE — Telephone Encounter (Signed)
Patient has dropped off FMLA to be completed for Dr.John taking him out of work. May 31 - June 09 - Return day June 10.   Forms have been completed & placed in Dr. Gwynn Burly box to sign.   Patient also needs the intermitting forms that Dr.Plotnikov completed during his last visit re done. The need more information on why it says he can only work 2 days a week.   Dr.Plot is out of office June 5th. Once he returns I will follow up with him and inform patient.

## 2017-08-31 NOTE — Telephone Encounter (Signed)
Forms have been completed & signed by both providers.   Copy sent to scan, no charge - patient has been charged for forms in March.   Patient informed the original is ready to be picked up. Also informed him the fax number I have will not go through.  He will take them to employer.

## 2017-09-01 ENCOUNTER — Encounter: Payer: Self-pay | Admitting: Podiatry

## 2017-09-01 ENCOUNTER — Ambulatory Visit (INDEPENDENT_AMBULATORY_CARE_PROVIDER_SITE_OTHER): Payer: BLUE CROSS/BLUE SHIELD | Admitting: Podiatry

## 2017-09-01 ENCOUNTER — Ambulatory Visit (INDEPENDENT_AMBULATORY_CARE_PROVIDER_SITE_OTHER): Payer: BLUE CROSS/BLUE SHIELD

## 2017-09-01 DIAGNOSIS — L97519 Non-pressure chronic ulcer of other part of right foot with unspecified severity: Secondary | ICD-10-CM

## 2017-09-01 DIAGNOSIS — E08621 Diabetes mellitus due to underlying condition with foot ulcer: Secondary | ICD-10-CM | POA: Diagnosis not present

## 2017-09-01 DIAGNOSIS — M216X9 Other acquired deformities of unspecified foot: Secondary | ICD-10-CM | POA: Diagnosis not present

## 2017-09-01 DIAGNOSIS — M21961 Unspecified acquired deformity of right lower leg: Secondary | ICD-10-CM | POA: Diagnosis not present

## 2017-09-01 NOTE — Progress Notes (Signed)
Subjective:  Patient ID: [name], male    DOB: 1970/12/28,  MRN: [id number]  Chief Complaint  Patient presents with  . Diabetes    diabetic foot exam - hx of several amputated toes  . Diabetic Ulcer    right foot - 2nd/3rd metatarsal    47 y.o. male presents with the above complaint.  Reports history of amputations to the toes of both feet by Dr. due to.  Chronic wound to the right foot for the past several months.  Was discharged from hospital in March.  Past Medical History:  Diagnosis Date  . Diabetes mellitus type II, uncontrolled ([place])   . Hyperlipidemia   . Hypertension   . Other and unspecified angina pectoris   . Tobacco use    Past Surgical History:  Procedure Laterality Date  . AMPUTATION Left 08/22/2016   Procedure: GREAT TOE AMPUTATION;  Surgeon: [name], MD;  Location: [place];  Service: Orthopedics;  Laterality: Left;  . AMPUTATION Right 05/28/2017   Procedure: AMPUTATION 1st & 3rd TOE;  Surgeon: [name], MD;  Location: [place];  Service: Orthopedics;  Laterality: Right;  . EYE SURGERY    . LEFT HEART CATHETERIZATION WITH CORONARY ANGIOGRAM N/A 07/31/2013   Procedure: LEFT HEART CATHETERIZATION WITH CORONARY ANGIOGRAM;  Surgeon: Jettie Booze, MD;  Location: [facility] CATH LAB;  Service: Cardiovascular;  Laterality: N/A;  . spinal tap    . toe amputated      Current Outpatient Medications:  .  aspirin EC 81 MG tablet, Take 81 mg by mouth daily., Disp: , Rfl:  .  atorvastatin (LIPITOR) 20 MG tablet, Take 1 tablet (20 mg total) by mouth daily., Disp: 30 tablet, Rfl: 11 .  b complex vitamins tablet, Take 1 tablet by mouth daily., Disp: 100 tablet, Rfl: 3 .  blood glucose meter kit and supplies KIT, Dispense based on patient and insurance preference. Use up to four times daily as directed. (FOR ICD-9 250.00, 250.01)., Disp: 1 each, Rfl: 0 .  Cholecalciferol (VITAMIN D3) 50000 units CAPS, Take 1 capsule by mouth once a week., Disp: 12 capsule, Rfl: 3 .   citalopram (CELEXA) 20 MG tablet, Take 1 tablet (20 mg total) by mouth daily., Disp: 30 tablet, Rfl: 11 .  Cyanocobalamin (VITAMIN B-12 PO), Take 1 capsule by mouth daily., Disp: , Rfl:  .  ferrous sulfate 325 (65 FE) MG tablet, Take 1 tablet (325 mg total) by mouth daily., Disp: 30 tablet, Rfl: 3 .  gabapentin (NEURONTIN) 300 MG capsule, Take 1 capsule (300 mg total) by mouth 3 (three) times daily., Disp: 90 capsule, Rfl: 5 .  ibuprofen (ADVIL,MOTRIN) 200 MG tablet, Take 600 mg by mouth every 6 (six) hours as needed for moderate pain., Disp: , Rfl:  .  lisinopril (PRINIVIL,ZESTRIL) 20 MG tablet, Take 1 tablet (20 mg total) by mouth daily., Disp: 30 tablet, Rfl: 11 .  metFORMIN (GLUCOPHAGE) 500 MG tablet, Take 1 tablet (500 mg total) by mouth 2 (two) times daily with a meal., Disp: 60 tablet, Rfl: 11 .  metoprolol tartrate (LOPRESSOR) 25 MG tablet, Take 1 tablet (25 mg total) by mouth 2 (two) times daily., Disp: 60 tablet, Rfl: 11 .  nitroGLYCERIN (NITROSTAT) 0.4 MG SL tablet, Place 1 tablet (0.4 mg total) under the tongue every 5 (five) minutes as needed for chest pain., Disp: 25 tablet, Rfl: 5 .  oxyCODONE (OXY IR/ROXICODONE) 5 MG immediate release tablet, Take 1 tablet (5 mg total) by mouth every  4 (four) hours as needed for severe pain., Disp: 10 tablet, Rfl: 0 .  senna-docusate (SENOKOT-S) 8.6-50 MG tablet, While taking oxycodone, take 1-2 pills twice daily as needed to have a bowel movement daily., Disp: 30 tablet, Rfl: 0 .  sildenafil (REVATIO) 20 MG tablet, Take 20 mg by mouth daily as needed for erectile dysfunction., Disp: , Rfl:  .  sulfamethoxazole-trimethoprim (BACTRIM DS,SEPTRA DS) 800-160 MG tablet, Take 1 tablet by mouth 2 (two) times daily., Disp: 20 tablet, Rfl: 0 .  vardenafil (LEVITRA) 20 MG tablet, Take 1 tablet (20 mg total) by mouth daily as needed for erectile dysfunction., Disp: 20 tablet, Rfl: 6  Allergies  Allergen Reactions  . Bee Venom Swelling    SWELLING REACTION  UNSPECIFIED   . Penicillins Hives and Other (See Comments)    PATIENT HAS HAD A PCN REACTION WITH IMMEDIATE RASH, FACIAL/TONGUE/THROAT SWELLING, SOB, OR LIGHTHEADEDNESS WITH HYPOTENSION:  #  #  #  YES  #  #  #   Has patient had a PCN reaction causing severe rash involving mucus membranes or skin necrosis: No Has patient had a PCN reaction that required hospitalization: No Has patient had a PCN reaction occurring within the last 10 years: No Allergy Severity noted prior to 05/28/17    Review of Systems: Negative except as noted in the HPI. Denies N/V/F/Ch. Objective:  There were no vitals filed for this visit. General AA&O x3. Normal mood and affect.  Vascular Dorsalis pedis and posterior tibial pulses  present 1+ bilaterally  Capillary refill normal to all digits. Pedal hair growth diminished.  Neurologic Epicritic sensation grossly present. Protective sensation absent  Dermatologic (Wound) Wound Location: R 2nd/3rd MPJ plantar Wound Measurement: 2x2 Wound Base: Granular/Healthy Peri-wound: Macerated, Calloused Exudate: None: wound tissue dry   Orthopedic: MMT 5/5 in dorsiflexion, plantarflexion, inversion, and eversion. Normal lower extremity joint ROM without pain or crepitus. History of amputation bilateral hallux, right third toe   Radiographs: Taken and reviewed. No osseous erosions present. No acute fractures or dislocations.  Assessment & Plan:  Patient was evaluated and treated and all questions answered.  Ulceration right second third metatarsal heads -X-rays taken reviewed no acute fracture dislocation no underlying osseous erosions -Discussed with patient that due to the concerns for pressure would likely benefit from eventual trans-metatarsal amputation.  However will attempt aggressive wound care for right now to try to alleviate the condition.  Would also consider possible Achilles tendon lengthening prior to transmetatarsal amputation -Wound excisionally debrided as  below -Dispensed cam boot -Dressed with medihoney and DSD  Procedure: Excisional Debridement of Wound Rationale: Removal of non-viable soft tissue from the wound to promote healing.  Anesthesia: none Pre-Debridement Wound Measurements: 1.5 cm x 1.5 cm x 0.1 cm  Post-Debridement Wound Measurements: 2 cm x 2 cm x 01 cm  Type of Debridement: Excisional Tissue Removed: Non-viable soft tissue Depth of Debridement: subq Instrumentation: 312 blade, tissue nipper. Technique: Sharp excisional debridement to bleeding, viable wound base.  Dressing: Dry, sterile, compression dressing. Disposition: Patient tolerated procedure well. Patient to return in 1 week for follow-up.     History of left hallux amputation  -Would benefit from insert with toe filler will fabricate for patient.  Patient will need appointment with orthotist  Return in about 1 week (around 09/08/2017) for Wound Care.

## 2017-09-08 ENCOUNTER — Ambulatory Visit (INDEPENDENT_AMBULATORY_CARE_PROVIDER_SITE_OTHER): Payer: BLUE CROSS/BLUE SHIELD | Admitting: Podiatry

## 2017-09-08 DIAGNOSIS — M21961 Unspecified acquired deformity of right lower leg: Secondary | ICD-10-CM

## 2017-09-08 DIAGNOSIS — M216X9 Other acquired deformities of unspecified foot: Secondary | ICD-10-CM | POA: Diagnosis not present

## 2017-09-08 DIAGNOSIS — E08621 Diabetes mellitus due to underlying condition with foot ulcer: Secondary | ICD-10-CM

## 2017-09-08 DIAGNOSIS — L97519 Non-pressure chronic ulcer of other part of right foot with unspecified severity: Secondary | ICD-10-CM | POA: Diagnosis not present

## 2017-09-08 NOTE — Patient Instructions (Signed)
Pre-Operative Instructions  Congratulations, you have decided to take an important step towards improving your quality of life.  You can be assured that the doctors and staff at [facility] will be with you every step of the way.  Here are some important things you should know:  1. Plan to be at the surgery center/hospital at least 1 (one) hour prior to your scheduled time, unless otherwise directed by the surgical center/hospital staff.  You must have a responsible adult accompany you, remain during the surgery and drive you home.  Make sure you have directions to the surgical center/hospital to ensure you arrive on time. 2. If you are having surgery at [facility] or [facility], you will need a copy of your medical history and physical form from your family physician within one month prior to the date of surgery. We will give you a form for your primary physician to complete.  3. We make every effort to accommodate the date you request for surgery.  However, there are times where surgery dates or times have to be moved.  We will contact you as soon as possible if a change in schedule is required.   4. No aspirin/ibuprofen for one week before surgery.  If you are on aspirin, any non-steroidal anti-inflammatory medications (Mobic, Aleve, Ibuprofen) should not be taken seven (7) days prior to your surgery.  You make take Tylenol for pain prior to surgery.  5. Medications - If you are taking daily heart and blood pressure medications, seizure, reflux, allergy, asthma, anxiety, pain or diabetes medications, make sure you notify the surgery center/hospital before the day of surgery so they can tell you which medications you should take or avoid the day of surgery. 6. No food or drink after midnight the night before surgery unless directed otherwise by surgical center/hospital staff. 7. No alcoholic beverages 24-hours prior to surgery.  No smoking 24-hours prior or 24-hours after  surgery. 8. Wear loose pants or shorts. They should be loose enough to fit over bandages, boots, and casts. 9. Don't wear slip-on shoes. Sneakers are preferred. 10. Bring your boot with you to the surgery center/hospital.  Also bring crutches or a walker if your physician has prescribed it for you.  If you do not have this equipment, it will be provided for you after surgery. 11. If you have not been contacted by the surgery center/hospital by the day before your surgery, call to confirm the date and time of your surgery. 12. Leave-time from work may vary depending on the type of surgery you have.  Appropriate arrangements should be made prior to surgery with your employer. 13. Prescriptions will be provided immediately following surgery by your doctor.  Fill these as soon as possible after surgery and take the medication as directed. Pain medications will not be refilled on weekends and must be approved by the doctor. 14. Remove nail polish on the operative foot and avoid getting pedicures prior to surgery. 15. Wash the night before surgery.  The night before surgery wash the foot and leg well with water and the antibacterial soap provided. Be sure to pay special attention to beneath the toenails and in between the toes.  Wash for at least three (3) minutes. Rinse thoroughly with water and dry well with a towel.  Perform this wash unless told not to do so by your physician.  Enclosed: 1 Ice pack (please put in freezer the night before surgery)   1 Hibiclens skin cleaner     Pre-op instructions  If you have any questions regarding the instructions, please do not hesitate to call our office.  [place]: [address], [name], [place] [address] -- [phone]  [place]: [address] [place], [place] [address] -- [phone]  [place]: [address]  [place], [place] [address] -- [phone]  [place]: [address], [address], [place], [place] [address] -- [phone]  Website:  [email or web address] 

## 2017-09-09 ENCOUNTER — Telehealth: Payer: Self-pay | Admitting: *Deleted

## 2017-09-09 NOTE — Telephone Encounter (Signed)
I called the patient to see if he'd like to have surgery on Friday, 09/16/2017.  He stated that was fine.  I told him someone from the surgical center would call him a day or two prior to the surgery date and give him the arrival time.  I informed him that he needs to register on-line with the surgical center, instructions are in the brochure that we gave you.  I called Caren Griffins at the surgical center.  She said Dr. March Rummage can start at 7 am that morning.

## 2017-09-12 ENCOUNTER — Encounter: Payer: Self-pay | Admitting: Podiatry

## 2017-09-15 ENCOUNTER — Telehealth: Payer: Self-pay | Admitting: *Deleted

## 2017-09-15 NOTE — Telephone Encounter (Signed)
[facility] states they do not have a program, but have a flyer in office "You Can Quit" call to register [phone] or [email or web address].

## 2017-09-15 NOTE — Telephone Encounter (Signed)
I informed pt of the "You Can Quit" registration [id number].

## 2017-09-16 ENCOUNTER — Encounter: Payer: Self-pay | Admitting: Podiatry

## 2017-09-16 ENCOUNTER — Other Ambulatory Visit: Payer: Self-pay | Admitting: Podiatry

## 2017-09-16 DIAGNOSIS — M216X1 Other acquired deformities of right foot: Secondary | ICD-10-CM | POA: Diagnosis not present

## 2017-09-16 MED ORDER — OXYCODONE-ACETAMINOPHEN 10-325 MG PO TABS: 1.0000 | ORAL_TABLET | ORAL | 0 refills | Status: DC | PRN

## 2017-09-16 MED ORDER — CLINDAMYCIN HCL 150 MG PO CAPS: 150.0000 mg | ORAL_CAPSULE | Freq: Two times a day (BID) | ORAL | 0 refills | Status: DC

## 2017-09-22 ENCOUNTER — Encounter: Payer: Self-pay | Admitting: Podiatry

## 2017-09-22 ENCOUNTER — Ambulatory Visit (INDEPENDENT_AMBULATORY_CARE_PROVIDER_SITE_OTHER): Payer: BLUE CROSS/BLUE SHIELD | Admitting: Podiatry

## 2017-09-22 DIAGNOSIS — L97519 Non-pressure chronic ulcer of other part of right foot with unspecified severity: Secondary | ICD-10-CM | POA: Diagnosis not present

## 2017-09-22 DIAGNOSIS — E08621 Diabetes mellitus due to underlying condition with foot ulcer: Secondary | ICD-10-CM | POA: Diagnosis not present

## 2017-09-22 DIAGNOSIS — M216X9 Other acquired deformities of unspecified foot: Secondary | ICD-10-CM

## 2017-09-22 DIAGNOSIS — M21961 Unspecified acquired deformity of right lower leg: Secondary | ICD-10-CM

## 2017-09-22 NOTE — Progress Notes (Signed)
  Subjective:  Patient ID: [name], male    DOB: 10-01-1970,  MRN: [id number]  Chief Complaint  Patient presents with  . Routine Post Op     dos 06.21.2019 Tendo-Achilles Length Rt and/or Gastrocnemius Recess Rt; pt stated, "been doing okay, not much pain, just regular soreness; unable to take pain meds, makes my stomach hurt"   DOS: 09/16/17 Procedure: Right gastrocnemius recession  47 y.o. male returns for post-op check. Denies N/V/F/Ch.  States is been doing okay not having much pain thinks that the pain meds because stomach pain  Objective:  There were no vitals filed for this visit. General AA&O x3. Normal mood and affect.  Vascular Dorsalis pedis and posterior tibial pulses  present 1+ bilaterally  Capillary refill normal to all digits. Pedal hair growth diminished.  Neurologic Epicritic sensation grossly present. Protective sensation absent  Dermatologic (Wound) Wound Location: Right sub-met 3 Wound Measurement: 0.8 x 0.8 superficial Wound Base: Granular/Healthy Peri-wound: Calloused Exudate: None: wound tissue dry Wound progress: Improved since last check.  Gastroc incision skin healing well without signs of infection. Skin edges well coapted without signs of infection.  Orthopedic: MMT 5/5 in dorsiflexion, plantarflexion, inversion, and eversion. Normal lower extremity joint ROM without pain or crepitus. Ankle range of motion to 10 degrees   Assessment & Plan:  Patient was evaluated and treated and all questions answered.  S/p gastrocnemius recession right -Progressing as expected post-operatively. -Staples: Intact. -Medications refilled: None -Foot redressed.  Ulcer right forefoot -Debrided as below -Appears to be improving -Dressed with medihoney and DSD  Procedure: Excisional Debridement of Wound Rationale: Removal of non-viable soft tissue from the wound to promote healing.  Anesthesia: none Pre-Debridement Wound Measurements: 0.5 cm x 0.5 cm x  0.1 cm  Post-Debridement Wound Measurements: 0.8 cm x 0.8 cm x 0.1 cm  Type of Debridement: Excisional Tissue Removed: Non-viable soft tissue Depth of Debridement: subq Instrumentation: 312 blade Technique: Sharp excisional debridement to bleeding, viable wound base.  Dressing: Dry, sterile, compression dressing. Disposition: Patient tolerated procedure well. Patient to return in 1 week for follow-up.     Return in about 6 days (around 09/28/2017).

## 2017-09-25 NOTE — Progress Notes (Signed)
  Subjective:  Patient ID: [name], male    DOB: June 30, 1970,  MRN: [id number]  Chief Complaint  Patient presents with  . Diabetic Ulcer    right foot - not any better - looks like further breakdown along lateral edge of foot    47 y.o. male presents with the above complaint.  States the wound does not look any better and has a new area of breakdown Objective:  There were no vitals filed for this visit. General AA&O x3. Normal mood and affect.  Vascular Dorsalis pedis and posterior tibial pulses  present 1+ bilaterally  Capillary refill normal to all digits. Pedal hair growth diminished.  Neurologic Epicritic sensation grossly present. Protective sensation absent  Dermatologic (Wound) Wound Location: R 2nd/3rd MPJ plantar Wound Measurement: 2x2 Wound Base: Granular/Healthy Peri-wound: Macerated, Calloused Exudate: None: wound tissue dry   Orthopedic: MMT 5/5 in dorsiflexion, plantarflexion, inversion, and eversion. Normal lower extremity joint ROM without pain or crepitus. History of amputation bilateral hallux, right third toe   Assessment & Plan:  Patient was evaluated and treated and all questions answered.  Ulceration right second third metatarsal heads -Wound appears status quo would benefit from ankle tendon lengthening to decrease plantar forefoot pressure.  Consider TAL versus gastric positional response alternatives patient agrees to proceed  Procedure: Selective Debridement of Wound Rationale: Removal of devitalized tissue from the wound to promote healing.  Pre-Debridement Wound Measurements: 2 cm x 2 cm x 0.2 cm  Post-Debridement Wound Measurements: same as pre-debridement. Type of Debridement: Selective Tissue Removed: Devitalized soft-tissue Instrumentation: 3-0 mm dermal curette Dressing: Dry, sterile, compression dressing. Disposition: Patient tolerated procedure well. Patient to return in 1 week for follow-up.      History of left hallux  amputation  -Would benefit from insert with toe filler will fabricate for patient.  Patient will need appointment with orthotist  Return for post op care.

## 2017-09-27 NOTE — Progress Notes (Signed)
DOS 09/16/2017  Tendo-Achilles lengthening and Gastrocnemius recess right.

## 2017-09-28 ENCOUNTER — Ambulatory Visit (INDEPENDENT_AMBULATORY_CARE_PROVIDER_SITE_OTHER): Payer: BLUE CROSS/BLUE SHIELD | Admitting: Podiatry

## 2017-09-28 ENCOUNTER — Encounter: Payer: Self-pay | Admitting: Podiatry

## 2017-09-28 VITALS — BP 213/121 | HR 101 | Temp 97.8°F | Resp 16

## 2017-09-28 DIAGNOSIS — E08621 Diabetes mellitus due to underlying condition with foot ulcer: Secondary | ICD-10-CM

## 2017-09-28 DIAGNOSIS — L97519 Non-pressure chronic ulcer of other part of right foot with unspecified severity: Secondary | ICD-10-CM | POA: Diagnosis not present

## 2017-10-03 ENCOUNTER — Encounter: Payer: Self-pay | Admitting: Podiatry

## 2017-10-07 ENCOUNTER — Ambulatory Visit (INDEPENDENT_AMBULATORY_CARE_PROVIDER_SITE_OTHER): Payer: BLUE CROSS/BLUE SHIELD | Admitting: Podiatry

## 2017-10-07 ENCOUNTER — Encounter: Payer: Self-pay | Admitting: Podiatry

## 2017-10-07 DIAGNOSIS — L97519 Non-pressure chronic ulcer of other part of right foot with unspecified severity: Secondary | ICD-10-CM | POA: Diagnosis not present

## 2017-10-07 DIAGNOSIS — E08621 Diabetes mellitus due to underlying condition with foot ulcer: Secondary | ICD-10-CM | POA: Diagnosis not present

## 2017-10-07 NOTE — Progress Notes (Signed)
  Subjective:  Patient ID: [name], male    DOB: Oct 21, 1970,  MRN: [id number]  Chief Complaint  Patient presents with  . Diabetic Ulcer    m 9  DFU 2 week follow up   DOS: 09/16/17 Procedure: Right gastrocnemius recession  47 y.o. male returns for post-op check. Denies N/V/F/Ch.  Doing well, no post-op issues.  Objective:  There were no vitals filed for this visit. General AA&O x3. Normal mood and affect.  Vascular Dorsalis pedis and posterior tibial pulses  present 1+ bilaterally  Capillary refill normal to all digits. Pedal hair growth diminished.  Neurologic Epicritic sensation grossly present. Protective sensation absent  Dermatologic (Wound) Wound Location: Right sub-met 3 Wound Measurement: 0.5x0.5 superficial Wound Base: Granular/Healthy Peri-wound: Calloused Exudate: None: wound tissue dry Wound progress: Improved since last check.  Gastroc incision skin healing well without signs of infection. Skin edges well coapted without signs of infection.  Orthopedic: MMT 5/5 in dorsiflexion, plantarflexion, inversion, and eversion. Normal lower extremity joint ROM without pain or crepitus. Ankle range of motion to 10 degrees   Assessment & Plan:  Patient was evaluated and treated and all questions answered.  S/p gastrocnemius recession right -Progressing as expected post-operatively. -Staples: removed. -Medications refilled: None -Foot redressed.  Ulcer right forefoot -Debrided as below -Continues to improve. -Dressed with medihoney and DSD -Will get appt for toe filler.  Procedure: Excisional Debridement of Wound Rationale: Removal of non-viable soft tissue from the wound to promote healing.  Anesthesia: none Pre-Debridement Wound Measurements: overlying hyperkeratosis  Post-Debridement Wound Measurements: 0.5 cm x 0.5 cm x 0.1 cm  Type of Debridement: Excisional Tissue Removed: Non-viable soft tissue Depth of Debridement: subq Instrumentation: 312  blade, tissue nipper. Technique: Sharp excisional debridement to bleeding, viable wound base.  Dressing: Dry, sterile, compression dressing. Disposition: Patient tolerated procedure well. Patient to return in 1 week for follow-up.     Return in about 1 week (around 10/14/2017) for Wound Care, Right.

## 2017-10-12 ENCOUNTER — Ambulatory Visit (INDEPENDENT_AMBULATORY_CARE_PROVIDER_SITE_OTHER): Payer: BLUE CROSS/BLUE SHIELD | Admitting: Orthotics

## 2017-10-12 DIAGNOSIS — M21962 Unspecified acquired deformity of left lower leg: Secondary | ICD-10-CM

## 2017-10-12 DIAGNOSIS — E08621 Diabetes mellitus due to underlying condition with foot ulcer: Secondary | ICD-10-CM | POA: Diagnosis not present

## 2017-10-12 DIAGNOSIS — L97529 Non-pressure chronic ulcer of other part of left foot with unspecified severity: Secondary | ICD-10-CM

## 2017-10-12 DIAGNOSIS — M21961 Unspecified acquired deformity of right lower leg: Secondary | ICD-10-CM

## 2017-10-12 DIAGNOSIS — L97519 Non-pressure chronic ulcer of other part of right foot with unspecified severity: Secondary | ICD-10-CM | POA: Diagnosis not present

## 2017-10-12 DIAGNOSIS — M216X9 Other acquired deformities of unspecified foot: Secondary | ICD-10-CM

## 2017-10-12 NOTE — Progress Notes (Signed)
Patient cast today for b/l toe fillers (hallux)

## 2017-10-13 ENCOUNTER — Ambulatory Visit (INDEPENDENT_AMBULATORY_CARE_PROVIDER_SITE_OTHER): Payer: BLUE CROSS/BLUE SHIELD | Admitting: Podiatry

## 2017-10-13 ENCOUNTER — Encounter: Payer: Self-pay | Admitting: Podiatry

## 2017-10-13 VITALS — BP 166/109 | HR 112 | Temp 98.8°F | Resp 16

## 2017-10-13 DIAGNOSIS — L97519 Non-pressure chronic ulcer of other part of right foot with unspecified severity: Secondary | ICD-10-CM

## 2017-10-13 DIAGNOSIS — E08621 Diabetes mellitus due to underlying condition with foot ulcer: Secondary | ICD-10-CM

## 2017-10-17 NOTE — Progress Notes (Signed)
  Subjective:  Patient ID: [name], male    DOB: September 22, 1970,  MRN: [id number]  Chief Complaint  Patient presents with  . Foot Ulcer    F/U Right foot ulcer -Pt denies N/V/F/Ch/drainage/swelling/or redness Tx: dressing   DOS: 09/16/17 Procedure: Right gastrocnemius recession  47 y.o. male returns for post-op check. Denies N/V/F/Ch.  Denies nausea vomiting fever chills.  Denies drainage or swelling.  Doing well.  Ready to go back to work  Objective:   Vitals:   10/13/17 1648  BP: (!) 166/109  Pulse: (!) 112  Resp: 16  Temp: 98.8 F (37.1 C)   General AA&O x3. Normal mood and affect.  Vascular Dorsalis pedis and posterior tibial pulses  present 1+ bilaterally  Capillary refill normal to all digits. Pedal hair growth diminished.  Neurologic Epicritic sensation grossly present. Protective sensation absent  Dermatologic (Wound) Wound Location: Right sub-met 3 Wound Measurement: Epithelialized Wound Base: Granular/Healthy Peri-wound: Calloused Exudate: None: wound tissue dry Wound progress: Improved since last check.  Gastroc incision skin healing well without signs of infection. Skin edges well coapted without signs of infection.  Orthopedic: MMT 5/5 in dorsiflexion, plantarflexion, inversion, and eversion. Normal lower extremity joint ROM without pain or crepitus. Ankle range of motion to 10 degrees   Assessment & Plan:  Patient was evaluated and treated and all questions answered.  S/p gastrocnemius recession right -Progressing as expected post-operatively. -Incisions healing well. -Medications refilled: None -Foot redressed.  Ulcer right forefoot -Epithelialized today. No debridement.  Cleared to go back to work.  Advised to monitor for signs of worsening should worsen -  Return in about 2 weeks (around 10/27/2017).

## 2017-10-17 NOTE — Progress Notes (Signed)
  Subjective:  Patient ID: [name], male    DOB: 16-Oct-1970,  MRN: [id number]  Chief Complaint  Patient presents with  . Routine Post Op    Pt. stated," my foot hurts every now and than, but it's doing fine." -pt denies N/v/f/ch Tx: oxycodone and antibiotic  . Foot Ulcer    F/U L foot ulcer Pt. stated," it's doing fine." Tx: none   DOS: 09/16/17 Procedure: Right gastrocnemius recession  47 y.o. male returns for post-op check. Denies N/V/F/Ch. Foot hurts every now and then, otherwise states it's doing fine.  Objective:   Vitals:   09/28/17 0929  BP: (!) 213/121  Pulse: (!) 101  Resp: 16  Temp: 97.8 F (36.6 C)   General AA&O x3. Normal mood and affect.  Vascular Dorsalis pedis and posterior tibial pulses  present 1+ bilaterally  Capillary refill normal to all digits. Pedal hair growth diminished.  Neurologic Epicritic sensation grossly present. Protective sensation absent  Dermatologic (Wound) Wound Location: Right sub-met 3 Wound Measurement: 0.5 x 0.8  Wound Base: Granular/Healthy Peri-wound: Calloused Exudate: None: wound tissue dry Wound progress: Improved since last check.  Gastroc incision skin healing well without signs of infection. Skin edges well coapted without signs of infection.  Orthopedic: MMT 5/5 in dorsiflexion, plantarflexion, inversion, and eversion. Normal lower extremity joint ROM without pain or crepitus. Ankle range of motion to 10 degrees   Assessment & Plan:  Patient was evaluated and treated and all questions answered.  S/p gastrocnemius recession right -Progressing as expected post-operatively. -Staples: Intact. -Medications refilled: None -Foot redressed.  Ulcer right forefoot -Debrided as below -Appears to be improving -Dressed with medihoney and DSD  Procedure: Selective Debridement of Wound Rationale: Removal of devitalized tissue from the wound to promote healing.  Pre-Debridement Wound Measurements: 0.8 cm x 0.5 cm x  0.1 cm  Post-Debridement Wound Measurements: same as pre-debridement. Type of Debridement: Selective Tissue Removed: Devitalized soft-tissue Instrumentation: 3-0 mm dermal curette Dressing: Dry, sterile, compression dressing. Disposition: Patient tolerated procedure well. Patient to return in 1 week for follow-up.      Return in about 1 week (around 10/05/2017) for Post-op.

## 2017-10-27 ENCOUNTER — Ambulatory Visit: Payer: BLUE CROSS/BLUE SHIELD | Admitting: Podiatry

## 2017-11-03 ENCOUNTER — Other Ambulatory Visit: Payer: BLUE CROSS/BLUE SHIELD | Admitting: Orthotics

## 2017-11-08 ENCOUNTER — Other Ambulatory Visit: Payer: BLUE CROSS/BLUE SHIELD | Admitting: Orthotics

## 2017-11-14 ENCOUNTER — Other Ambulatory Visit: Payer: BLUE CROSS/BLUE SHIELD | Admitting: Orthotics

## 2017-11-21 ENCOUNTER — Ambulatory Visit: Payer: BLUE CROSS/BLUE SHIELD | Admitting: Orthotics

## 2017-11-21 DIAGNOSIS — Z89419 Acquired absence of unspecified great toe: Secondary | ICD-10-CM

## 2017-11-21 DIAGNOSIS — M216X9 Other acquired deformities of unspecified foot: Secondary | ICD-10-CM

## 2017-11-21 DIAGNOSIS — L97519 Non-pressure chronic ulcer of other part of right foot with unspecified severity: Principal | ICD-10-CM

## 2017-11-21 DIAGNOSIS — E08621 Diabetes mellitus due to underlying condition with foot ulcer: Secondary | ICD-10-CM

## 2017-11-21 DIAGNOSIS — L97402 Non-pressure chronic ulcer of unspecified heel and midfoot with fat layer exposed: Secondary | ICD-10-CM

## 2017-11-21 NOTE — Progress Notes (Signed)
Patient came in today for definitive fitting and dispensing L5000 toefillers b/l.  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  The fit of the f/o were good with no apparent issues.  Patient was advised to monitor residual limb/foot for any irriatation and/or skin breakdown.

## 2017-11-29 LAB — HM DIABETES EYE EXAM

## 2017-12-05 ENCOUNTER — Encounter: Payer: BLUE CROSS/BLUE SHIELD | Admitting: Internal Medicine

## 2018-04-11 ENCOUNTER — Telehealth: Payer: Self-pay | Admitting: Podiatry

## 2018-04-11 NOTE — Telephone Encounter (Signed)
Pt's request for temporary handicap placard was approved by Dr. March Rummage but for only 1 month. Patient will need to make appointment with Dr. March Rummage for longer handicap placard. Left voicemail for patient to call back to schedule.

## 2018-04-11 NOTE — Telephone Encounter (Signed)
Left message informing pt that Ddr. March Rummage had not seen him in office since July 2019, and if he was having a problem with his feet that required he continue to use the handicap parking, Dr. March Rummage needed to see him to make sure his problem was not worsening. I told pt I would have the front staff complete a handicap sticker for 1 month, to get him in to be evaluated.

## 2018-04-11 NOTE — Telephone Encounter (Signed)
Patient needs a handicap form filled out. Please let one of the staff member know how many months he is allowed.

## 2018-04-21 ENCOUNTER — Ambulatory Visit (INDEPENDENT_AMBULATORY_CARE_PROVIDER_SITE_OTHER): Payer: BLUE CROSS/BLUE SHIELD | Admitting: Podiatry

## 2018-04-21 ENCOUNTER — Encounter: Payer: Self-pay | Admitting: Podiatry

## 2018-04-21 VITALS — BP 201/115 | HR 103 | Resp 16

## 2018-04-21 DIAGNOSIS — E1169 Type 2 diabetes mellitus with other specified complication: Secondary | ICD-10-CM | POA: Diagnosis not present

## 2018-04-21 DIAGNOSIS — E1142 Type 2 diabetes mellitus with diabetic polyneuropathy: Secondary | ICD-10-CM | POA: Diagnosis not present

## 2018-04-21 DIAGNOSIS — B351 Tinea unguium: Secondary | ICD-10-CM

## 2018-04-21 DIAGNOSIS — L84 Corns and callosities: Secondary | ICD-10-CM

## 2018-04-21 DIAGNOSIS — Z89419 Acquired absence of unspecified great toe: Secondary | ICD-10-CM | POA: Diagnosis not present

## 2018-04-23 NOTE — Progress Notes (Signed)
Subjective:  Patient ID: [name], male    DOB: 1970/06/01,  MRN: [id number]  Chief Complaint  Patient presents with  . debride    BL nail trimming -BFS: 160 x 1 wk A1C: na   . Foot Ulcer    F/U R foot ulcer Pt. states," it's still very sore (7/10 sharp constant pain)." Tx: none -pt states he would like a handicap sticker -pt denies N/V/F/CH/drainage/swelling/redness    48 y.o. male presents  for diabetic foot care. Last AMBS was 160.  States that he did not follow-up after last visit because he was concerned that I would not let him go back to work.  States that the ulcer has remained healed and he has not had any issues with his foot.  Requesting a handicap sticker because he has a lot of pain in his foot and has to walk a far distance in and out of work and would like to be able to park closer. Reports numbness and tingling in their feet. Denies cramping in legs and thighs.  Review of Systems: Negative except as noted in the HPI. Denies N/V/F/Ch.  Past Medical History:  Diagnosis Date  . Diabetes mellitus type II, uncontrolled ([place])   . Hyperlipidemia   . Hypertension   . Other and unspecified angina pectoris   . Tobacco use     Current Outpatient Medications:  .  aspirin EC 81 MG tablet, Take 81 mg by mouth daily., Disp: , Rfl:  .  b complex vitamins tablet, Take 1 tablet by mouth daily., Disp: 100 tablet, Rfl: 3 .  blood glucose meter kit and supplies KIT, Dispense based on patient and insurance preference. Use up to four times daily as directed. (FOR ICD-9 250.00, 250.01)., Disp: 1 each, Rfl: 0 .  Cholecalciferol (VITAMIN D3) 50000 units CAPS, Take 1 capsule by mouth once a week., Disp: 12 capsule, Rfl: 3 .  citalopram (CELEXA) 20 MG tablet, Take 1 tablet (20 mg total) by mouth daily., Disp: 30 tablet, Rfl: 11 .  clindamycin (CLEOCIN) 150 MG capsule, Take 1 capsule (150 mg total) by mouth 2 (two) times daily., Disp: 14 capsule, Rfl: 0 .  Cyanocobalamin (VITAMIN B-12  PO), Take 1 capsule by mouth daily., Disp: , Rfl:  .  ferrous sulfate 325 (65 FE) MG tablet, Take 1 tablet (325 mg total) by mouth daily., Disp: 30 tablet, Rfl: 3 .  ibuprofen (ADVIL,MOTRIN) 200 MG tablet, Take 600 mg by mouth every 6 (six) hours as needed for moderate pain., Disp: , Rfl:  .  metFORMIN (GLUCOPHAGE) 500 MG tablet, Take 1 tablet (500 mg total) by mouth 2 (two) times daily with a meal., Disp: 60 tablet, Rfl: 11 .  nitroGLYCERIN (NITROSTAT) 0.4 MG SL tablet, Place 1 tablet (0.4 mg total) under the tongue every 5 (five) minutes as needed for chest pain., Disp: 25 tablet, Rfl: 5 .  oxyCODONE (OXY IR/ROXICODONE) 5 MG immediate release tablet, Take 1 tablet (5 mg total) by mouth every 4 (four) hours as needed for severe pain., Disp: 10 tablet, Rfl: 0 .  oxyCODONE-acetaminophen (PERCOCET) 10-325 MG tablet, Take 1 tablet by mouth every 4 (four) hours as needed for pain., Disp: 20 tablet, Rfl: 0 .  senna-docusate (SENOKOT-S) 8.6-50 MG tablet, While taking oxycodone, take 1-2 pills twice daily as needed to have a bowel movement daily., Disp: 30 tablet, Rfl: 0 .  sildenafil (REVATIO) 20 MG tablet, Take 20 mg by mouth daily as needed for erectile dysfunction., Disp: , Rfl:  .  sulfamethoxazole-trimethoprim (BACTRIM DS,SEPTRA DS) 800-160 MG tablet, Take 1 tablet by mouth 2 (two) times daily., Disp: 20 tablet, Rfl: 0 .  vardenafil (LEVITRA) 20 MG tablet, Take 1 tablet (20 mg total) by mouth daily as needed for erectile dysfunction., Disp: 20 tablet, Rfl: 6 .  atorvastatin (LIPITOR) 20 MG tablet, Take 1 tablet (20 mg total) by mouth daily., Disp: 30 tablet, Rfl: 11 .  gabapentin (NEURONTIN) 300 MG capsule, Take 1 capsule (300 mg total) by mouth 3 (three) times daily., Disp: 90 capsule, Rfl: 5 .  lisinopril (PRINIVIL,ZESTRIL) 20 MG tablet, Take 1 tablet (20 mg total) by mouth daily., Disp: 30 tablet, Rfl: 11 .  metoprolol tartrate (LOPRESSOR) 25 MG tablet, Take 1 tablet (25 mg total) by mouth 2 (two)  times daily., Disp: 60 tablet, Rfl: 11  Social History   Tobacco Use  Smoking Status Current Every Day Smoker  . Packs/day: 1.00  . Years: 20.00  . Pack years: 20.00  . Types: Cigarettes  Smokeless Tobacco Never Used  Tobacco Comment   wants patches     Allergies  Allergen Reactions  . Bee Venom Swelling    SWELLING REACTION UNSPECIFIED   . Penicillins Hives and Other (See Comments)    PATIENT HAS HAD A PCN REACTION WITH IMMEDIATE RASH, FACIAL/TONGUE/THROAT SWELLING, SOB, OR LIGHTHEADEDNESS WITH HYPOTENSION:  #  #  #  YES  #  #  #   Has patient had a PCN reaction causing severe rash involving mucus membranes or skin necrosis: No Has patient had a PCN reaction that required hospitalization: No Has patient had a PCN reaction occurring within the last 10 years: No Allergy Severity noted prior to 05/28/17    Objective:   Vitals:   04/21/18 1250  BP: (!) 201/115  Pulse: (!) 103  Resp: 16   There is no height or weight on file to calculate BMI. Constitutional Well developed. Well nourished.  Vascular Dorsalis pedis pulses present 1+ bilaterally  Posterior tibial pulses present 1+ bilaterally  Pedal hair growth diminished. Capillary refill normal to all digits.  No cyanosis or clubbing noted.  Neurologic Normal speech. Oriented to person, place, and time. Epicritic sensation to light touch grossly present bilaterally. Protective sensation with 5.07 monofilament  absent bilaterally. Vibratory sensation absent bilaterally.  Dermatologic Nails elongated, thickened, dystrophic. Significant hyperkeratosis plantar forefoot right sub-met 234 No open ulceration noted upon significant debridement Hyperkeratosis noted left plantar forefoot  Orthopedic: Normal joint ROM without pain or crepitus bilaterally. No visible deformities. No bony tenderness. R Hallux, 3rd toe amputations noted. L hallux amputation noted.   Assessment:   1. Onychomycosis of multiple toenails with type  2 diabetes mellitus and peripheral neuropathy ([place])   2. History of amputation of hallux ([place])   3. Pre-ulcerative calluses    Plan:  Patient was evaluated and treated and all questions answered.  Diabetes with Amputation Hx, Onychomycosis -Educated on diabetic footcare. Diabetic risk level 3 -Nails x10 debrided sharply and manually with large nail nipper and rotary burr.   Procedure: Nail Debridement Rationale: Patient meets criteria for routine foot care due to amputation history Type of Debridement: manual, sharp debridement. Instrumentation: Nail nipper, rotary burr. Number of Nails: 6   Procedure: Paring of Lesion Rationale: painful hyperkeratotic lesion Type of Debridement: manual, sharp debridement. Instrumentation: 312 blade Number of Lesions: 3   No follow-ups on file.

## 2018-05-03 ENCOUNTER — Encounter: Payer: Self-pay | Admitting: Podiatry

## 2018-05-03 ENCOUNTER — Other Ambulatory Visit: Payer: Self-pay

## 2018-05-03 ENCOUNTER — Emergency Department (HOSPITAL_COMMUNITY)
Admission: EM | Admit: 2018-05-03 | Discharge: 2018-05-03 | Disposition: A | Discharge status: Home / Self Care | Payer: BLUE CROSS/BLUE SHIELD | Attending: Emergency Medicine | Admitting: Emergency Medicine

## 2018-05-03 ENCOUNTER — Encounter (HOSPITAL_COMMUNITY): Payer: Self-pay

## 2018-05-03 DIAGNOSIS — I10 Essential (primary) hypertension: Secondary | ICD-10-CM | POA: Diagnosis not present

## 2018-05-03 DIAGNOSIS — Z7982 Long term (current) use of aspirin: Secondary | ICD-10-CM | POA: Insufficient documentation

## 2018-05-03 DIAGNOSIS — L02811 Cutaneous abscess of head [any part, except face]: Secondary | ICD-10-CM

## 2018-05-03 DIAGNOSIS — L0211 Cutaneous abscess of neck: Secondary | ICD-10-CM | POA: Insufficient documentation

## 2018-05-03 DIAGNOSIS — F17210 Nicotine dependence, cigarettes, uncomplicated: Secondary | ICD-10-CM | POA: Insufficient documentation

## 2018-05-03 DIAGNOSIS — Z7984 Long term (current) use of oral hypoglycemic drugs: Secondary | ICD-10-CM | POA: Diagnosis not present

## 2018-05-03 DIAGNOSIS — R221 Localized swelling, mass and lump, neck: Secondary | ICD-10-CM | POA: Diagnosis present

## 2018-05-03 DIAGNOSIS — E119 Type 2 diabetes mellitus without complications: Secondary | ICD-10-CM | POA: Diagnosis not present

## 2018-05-03 MED ORDER — DOXYCYCLINE HYCLATE 100 MG PO CAPS: 100.0000 mg | ORAL_CAPSULE | Freq: Two times a day (BID) | ORAL | 0 refills | Status: DC

## 2018-05-03 MED ORDER — LIDOCAINE-EPINEPHRINE (PF) 2 %-1:200000 IJ SOLN
20.0000 mL | Freq: Once | INTRAMUSCULAR | Status: AC
  Administered 2018-05-03: 20 mL
  Filled 2018-05-03: qty 20

## 2018-05-03 MED ORDER — IBUPROFEN 400 MG PO TABS: 400.0000 mg | ORAL_TABLET | Freq: Three times a day (TID) | ORAL | 0 refills | Status: DC | PRN

## 2018-05-03 NOTE — ED Triage Notes (Signed)
Pt states that his left foot is swollen for about 1 month. Pt states he had amputations at that time.  Pt states that he has a knot on his neck as well.

## 2018-05-06 NOTE — ED Provider Notes (Signed)
[facility] DEPT Provider Note   CSN: [id number] Arrival date & time: 05/03/18  7564     History   Chief Complaint Chief Complaint  Patient presents with  . Joint Swelling  . Neck Pain    HPI [name] is a 48 y.o. male.  HPI Patient is a 48 year old male presents emergency department complaints of posterior neck pain and swelling over the past month with some occasional drainage.  He denies fevers and chills.  He has a history of abscess formation and states he believes he has another abscess of his posterior neck/posterior scalp.  He also presents complaining of ongoing pain in his left foot over the past 2 months.  He has not called his podiatrist about this.  He denies fevers and chills.  Denies redness.  Denies significant swelling of his left foot.  Reports is more of chronic related symptoms in his left foot.  Denies abdominal pain.  No chest pain.  Denies nausea vomiting.  No fevers or chills.  No other complaints at this time.  Symptoms are mild in severity.  No neck stiffness.  Able to fully range neck.   Past Medical History:  Diagnosis Date  . Diabetes mellitus type II, uncontrolled ([place])   . Hyperlipidemia   . Hypertension   . Other and unspecified angina pectoris   . Tobacco use     Patient Active Problem List   Diagnosis Date Noted  . Diabetic ulcer of foot with fat layer exposed ([place]) 08/26/2017  . B12 deficiency 06/15/2017  . Vitamin D deficiency 06/03/2017  . Iron deficiency anemia 06/01/2017  . Diabetic foot infection ([place])   . Osteomyelitis ([place]) 05/25/2017  . Vitreous floaters of right eye 09/15/2016  . Non compliance w medication regimen 09/15/2016  . Depression 09/01/2016  . Foot ulcer due to secondary DM ([place]) 08/20/2016  . Hidradenitis suppurativa 10/13/2015  . Peripheral polyneuropathy 07/07/2015  . Coronary atherosclerosis of native coronary artery 08/17/2013  . Diabetic retinopathy ([place]) 02/02/2013  .  Diabetic neuropathy ([place]) 12/01/2012  . Hypertension associated with diabetes ([place]) 08/03/2012  . Tobacco use disorder 08/03/2012  . Erectile dysfunction 08/03/2012  . Diabetes mellitus with neurological manifestations, uncontrolled ([place]) 05/26/2006    Past Surgical History:  Procedure Laterality Date  . AMPUTATION Left 08/22/2016   Procedure: GREAT TOE AMPUTATION;  Surgeon: [name], MD;  Location: [facility];  Service: Orthopedics;  Laterality: Left;  . AMPUTATION Right 05/28/2017   Procedure: AMPUTATION 1st & 3rd TOE;  Surgeon: [name], MD;  Location: [facility];  Service: Orthopedics;  Laterality: Right;  . EYE SURGERY    . LEFT HEART CATHETERIZATION WITH CORONARY ANGIOGRAM N/A 07/31/2013   Procedure: LEFT HEART CATHETERIZATION WITH CORONARY ANGIOGRAM;  Surgeon: Jettie Booze, MD;  Location: [facility] CATH LAB;  Service: Cardiovascular;  Laterality: N/A;  . spinal tap    . toe amputated          Home Medications    Prior to Admission medications   Medication Sig Start Date End Date Taking? Authorizing Provider  aspirin EC 81 MG tablet Take 81 mg by mouth daily. 02/11/16  Yes [provider]  atorvastatin (LIPITOR) 20 MG tablet Take 1 tablet (20 mg total) by mouth daily. 06/01/17 05/03/18 Yes Plotnikov, Evie Lacks, MD  Cholecalciferol (VITAMIN D3) 50000 units CAPS Take 1 capsule by mouth once a week. 06/02/17  Yes Plotnikov, Evie Lacks, MD  citalopram (CELEXA) 20 MG tablet Take 1 tablet (  20 mg total) by mouth daily. 06/01/17 06/01/18 Yes Plotnikov, Evie Lacks, MD  ferrous sulfate 325 (65 FE) MG tablet Take 1 tablet (325 mg total) by mouth daily. 06/02/17 06/02/18 Yes Plotnikov, Evie Lacks, MD  lisinopril (PRINIVIL,ZESTRIL) 20 MG tablet Take 1 tablet (20 mg total) by mouth daily. 06/01/17 05/03/18 Yes Plotnikov, Evie Lacks, MD  metFORMIN (GLUCOPHAGE) 500 MG tablet Take 1 tablet (500 mg total) by mouth 2 (two) times daily with a meal. 06/01/17  Yes Plotnikov, Evie Lacks, MD  metoprolol tartrate  (LOPRESSOR) 25 MG tablet Take 1 tablet (25 mg total) by mouth 2 (two) times daily. 06/01/17 05/03/18 Yes Plotnikov, Evie Lacks, MD  sildenafil (REVATIO) 20 MG tablet Take 20 mg by mouth daily as needed for erectile dysfunction. 07/07/15  Yes [provider]  b complex vitamins tablet Take 1 tablet by mouth daily. Patient not taking: Reported on 05/03/2018 06/02/17   Plotnikov, Evie Lacks, MD  blood glucose meter kit and supplies KIT Dispense based on patient and insurance preference. Use up to four times daily as directed. (FOR ICD-9 250.00, 250.01). 08/25/16   Minus Liberty, MD  clindamycin (CLEOCIN) 150 MG capsule Take 1 capsule (150 mg total) by mouth 2 (two) times daily. Patient not taking: Reported on 05/03/2018 09/16/17   [name], DPM  doxycycline (VIBRAMYCIN) 100 MG capsule Take 1 capsule (100 mg total) by mouth 2 (two) times daily. 05/03/18   Jola Schmidt, MD  gabapentin (NEURONTIN) 300 MG capsule Take 1 capsule (300 mg total) by mouth 3 (three) times daily. Patient not taking: Reported on 05/03/2018 06/01/17 08/30/17  Plotnikov, Evie Lacks, MD  ibuprofen (ADVIL,MOTRIN) 400 MG tablet Take 1 tablet (400 mg total) by mouth every 8 (eight) hours as needed. 05/03/18   Jola Schmidt, MD  nitroGLYCERIN (NITROSTAT) 0.4 MG SL tablet Place 1 tablet (0.4 mg total) under the tongue every 5 (five) minutes as needed for chest pain. Patient not taking: Reported on 05/03/2018 07/26/13   Jettie Booze, MD  oxyCODONE (OXY IR/ROXICODONE) 5 MG immediate release tablet Take 1 tablet (5 mg total) by mouth every 4 (four) hours as needed for severe pain. Patient not taking: Reported on 05/03/2018 05/29/17   Lars Mage, MD  oxyCODONE-acetaminophen (PERCOCET) 10-325 MG tablet Take 1 tablet by mouth every 4 (four) hours as needed for pain. Patient not taking: Reported on 05/03/2018 09/16/17   [name], DPM  senna-docusate (SENOKOT-S) 8.6-50 MG tablet While taking oxycodone, take 1-2 pills twice daily as  needed to have a bowel movement daily. Patient not taking: Reported on 05/03/2018 08/23/16   Minus Liberty, MD  sulfamethoxazole-trimethoprim (BACTRIM DS,SEPTRA DS) 800-160 MG tablet Take 1 tablet by mouth 2 (two) times daily. Patient not taking: Reported on 05/03/2018 08/26/17   Biagio Borg, MD  vardenafil (LEVITRA) 20 MG tablet Take 1 tablet (20 mg total) by mouth daily as needed for erectile dysfunction. Patient not taking: Reported on 05/03/2018 08/02/17 08/02/18  Plotnikov, Evie Lacks, MD    Family History Family History  Problem Relation Age of Onset  . Cancer Mother   . Diabetes Father   . Hypertension Father     Social History Social History   Tobacco Use  . Smoking status: Current Every Day Smoker    Packs/day: 1.00    Years: 20.00    Pack years: 20.00    Types: Cigarettes  . Smokeless tobacco: Never Used  . Tobacco comment: wants patches   Substance Use Topics  . Alcohol  use: No    Comment: occ  . Drug use: No     Allergies   Bee venom and Penicillins   Review of Systems Review of Systems  All other systems reviewed and are negative.    Physical Exam Updated Vital Signs BP (!) 182/110 (BP Location: Right Arm)   Pulse 83   Temp 98.6 F (37 C) (Oral)   Resp 18   Ht '5\' 4"'$  (1.626 m)   Wt 83.9 kg   SpO2 100%   BMI 31.76 kg/m   Physical Exam Vitals signs and nursing note reviewed.  Constitutional:      Appearance: He is well-developed.  HENT:     Head: Normocephalic.     Comments: Small posterior superior neck abscess without drainage at this time.  No surrounding erythema.  Full range of motion of neck.  No swelling or erythema of the anterior neck.  This area is located at the beginning of the hairline. Neck:     Musculoskeletal: Normal range of motion.  Pulmonary:     Effort: Pulmonary effort is normal.  Abdominal:     General: There is no distension.  Musculoskeletal: Normal range of motion.     Comments: Several amputated toes on each foot  with well-healed surgical incisions.  Normal pulses in both feet.  Compartments of both feet are soft.  There is no erythema or swelling of either foot or leg.  Neurological:     Mental Status: He is alert and oriented to person, place, and time.      ED Treatments / Results  Labs (all labs ordered are listed, but only abnormal results are displayed) Labs Reviewed - No data to display  EKG None  Radiology No results found.  Procedures .Marland KitchenIncision and Drainage Performed by: Jola Schmidt, MD Authorized by: Jola Schmidt, MD     INCISION AND DRAINAGE Performed by: Jola Schmidt Consent: Verbal consent obtained. Risks and benefits: risks, benefits and alternatives were discussed Time out performed prior to procedure Type: abscess Body area: posterior neck Anesthesia: local infiltration Incision was made with a scalpel. Local anesthetic: lidocaine 2% with epinephrine Anesthetic total: 6 ml Complexity: complex Blunt dissection to break up loculations Drainage: purulent Drainage amount: small Packing material: none Patient tolerance: Patient tolerated the procedure well with no immediate complications.     Medications Ordered in ED Medications  lidocaine-EPINEPHrine (XYLOCAINE W/EPI) 2 %-1:200000 (PF) injection 20 mL (20 mLs Infiltration Given 05/03/18 0917)     Initial Impression / Assessment and Plan / ED Course  I have reviewed the triage vital signs and the nursing notes.  Pertinent labs & imaging results that were available during my care of the patient were reviewed by me and considered in my medical decision making (see chart for details).     Nonspecific chronic complaints of the feet.  I recommend they follow-up with his podiatrist regarding these.  In terms of his posterior neck there was apparent abscess.  This underwent incision and drainage.  Patient tolerated the procedure well.  There is a small amount of purulent material removed from the abscess.   After formal incision and drainage I do not think he needs antibiotics.  I have recommended warm compresses.  Patient is encouraged to return the emergency department for new or worsening symptoms  Final Clinical Impressions(s) / ED Diagnoses   Final diagnoses:  Scalp abscess    ED Discharge Orders         Ordered    ibuprofen (  ADVIL,MOTRIN) 400 MG tablet  Every 8 hours PRN     05/03/18 1145    doxycycline (VIBRAMYCIN) 100 MG capsule  2 times daily     05/03/18 1145           Jola Schmidt, MD 05/06/18 1319

## 2018-05-21 ENCOUNTER — Encounter (HOSPITAL_COMMUNITY): Payer: Self-pay | Admitting: Emergency Medicine

## 2018-05-21 ENCOUNTER — Inpatient Hospital Stay (HOSPITAL_COMMUNITY): Payer: BLUE CROSS/BLUE SHIELD

## 2018-05-21 ENCOUNTER — Observation Stay (HOSPITAL_COMMUNITY)
Admission: EM | Admit: 2018-05-21 | Discharge: 2018-05-22 | Disposition: A | Discharge status: Home / Self Care | Payer: BLUE CROSS/BLUE SHIELD | Attending: Internal Medicine | Admitting: Internal Medicine

## 2018-05-21 ENCOUNTER — Emergency Department (HOSPITAL_COMMUNITY): Payer: BLUE CROSS/BLUE SHIELD

## 2018-05-21 ENCOUNTER — Other Ambulatory Visit: Payer: Self-pay

## 2018-05-21 DIAGNOSIS — Z7982 Long term (current) use of aspirin: Secondary | ICD-10-CM | POA: Diagnosis not present

## 2018-05-21 DIAGNOSIS — F17210 Nicotine dependence, cigarettes, uncomplicated: Secondary | ICD-10-CM | POA: Diagnosis not present

## 2018-05-21 DIAGNOSIS — F17200 Nicotine dependence, unspecified, uncomplicated: Secondary | ICD-10-CM | POA: Diagnosis present

## 2018-05-21 DIAGNOSIS — E1165 Type 2 diabetes mellitus with hyperglycemia: Secondary | ICD-10-CM

## 2018-05-21 DIAGNOSIS — IMO0002 Reserved for concepts with insufficient information to code with codable children: Secondary | ICD-10-CM | POA: Diagnosis present

## 2018-05-21 DIAGNOSIS — I129 Hypertensive chronic kidney disease with stage 1 through stage 4 chronic kidney disease, or unspecified chronic kidney disease: Secondary | ICD-10-CM | POA: Insufficient documentation

## 2018-05-21 DIAGNOSIS — I10 Essential (primary) hypertension: Secondary | ICD-10-CM | POA: Diagnosis present

## 2018-05-21 DIAGNOSIS — E1159 Type 2 diabetes mellitus with other circulatory complications: Secondary | ICD-10-CM | POA: Diagnosis present

## 2018-05-21 DIAGNOSIS — Z79899 Other long term (current) drug therapy: Secondary | ICD-10-CM | POA: Diagnosis not present

## 2018-05-21 DIAGNOSIS — M868X7 Other osteomyelitis, ankle and foot: Secondary | ICD-10-CM | POA: Diagnosis not present

## 2018-05-21 DIAGNOSIS — E11610 Type 2 diabetes mellitus with diabetic neuropathic arthropathy: Secondary | ICD-10-CM | POA: Diagnosis not present

## 2018-05-21 DIAGNOSIS — N183 Chronic kidney disease, stage 3 (moderate): Secondary | ICD-10-CM | POA: Insufficient documentation

## 2018-05-21 DIAGNOSIS — M86179 Other acute osteomyelitis, unspecified ankle and foot: Secondary | ICD-10-CM | POA: Diagnosis present

## 2018-05-21 DIAGNOSIS — E785 Hyperlipidemia, unspecified: Secondary | ICD-10-CM | POA: Insufficient documentation

## 2018-05-21 DIAGNOSIS — M869 Osteomyelitis, unspecified: Secondary | ICD-10-CM | POA: Diagnosis present

## 2018-05-21 DIAGNOSIS — M86272 Subacute osteomyelitis, left ankle and foot: Secondary | ICD-10-CM | POA: Diagnosis present

## 2018-05-21 DIAGNOSIS — E1149 Type 2 diabetes mellitus with other diabetic neurological complication: Secondary | ICD-10-CM | POA: Diagnosis present

## 2018-05-21 DIAGNOSIS — Z7984 Long term (current) use of oral hypoglycemic drugs: Secondary | ICD-10-CM | POA: Insufficient documentation

## 2018-05-21 DIAGNOSIS — L0390 Cellulitis, unspecified: Secondary | ICD-10-CM | POA: Diagnosis present

## 2018-05-21 DIAGNOSIS — M86371 Chronic multifocal osteomyelitis, right ankle and foot: Secondary | ICD-10-CM | POA: Diagnosis not present

## 2018-05-21 DIAGNOSIS — E1122 Type 2 diabetes mellitus with diabetic chronic kidney disease: Secondary | ICD-10-CM | POA: Insufficient documentation

## 2018-05-21 LAB — BASIC METABOLIC PANEL
Anion gap: 6 (ref 5–15)
BUN: 18 mg/dL (ref 6–20)
CO2: 26 mmol/L (ref 22–32)
Calcium: 9.2 mg/dL (ref 8.9–10.3)
Chloride: 106 mmol/L (ref 98–111)
Creatinine, Ser: 1.65 mg/dL — ABNORMAL HIGH (ref 0.61–1.24)
GFR calc Af Amer: 56 mL/min — ABNORMAL LOW (ref >60)
GFR, EST NON AFRICAN AMERICAN: 48 mL/min — AB (ref >60)
Glucose, Bld: 188 mg/dL — ABNORMAL HIGH (ref 70–99)
Potassium: 4.1 mmol/L (ref 3.5–5.1)
Sodium: 138 mmol/L (ref 135–145)

## 2018-05-21 LAB — CBC WITH DIFFERENTIAL/PLATELET
Abs Immature Granulocytes: 0.04 10*3/uL (ref 0.00–0.07)
BASOS PCT: 0 %
Basophils Absolute: 0 10*3/uL (ref 0.0–0.1)
Eosinophils Absolute: 0.3 10*3/uL (ref 0.0–0.5)
Eosinophils Relative: 3 %
HCT: 37.5 % — ABNORMAL LOW (ref 39.0–52.0)
Hemoglobin: 11.7 g/dL — ABNORMAL LOW (ref 13.0–17.0)
Immature Granulocytes: 0 %
Lymphocytes Relative: 24 %
Lymphs Abs: 2.6 10*3/uL (ref 0.7–4.0)
MCH: 27.5 pg (ref 26.0–34.0)
MCHC: 31.2 g/dL (ref 30.0–36.0)
MCV: 88 fL (ref 80.0–100.0)
Monocytes Absolute: 0.7 10*3/uL (ref 0.1–1.0)
Monocytes Relative: 6 %
Neutro Abs: 7.4 10*3/uL (ref 1.7–7.7)
Neutrophils Relative %: 67 %
Platelets: 314 10*3/uL (ref 150–400)
RBC: 4.26 MIL/uL (ref 4.22–5.81)
RDW: 14.6 % (ref 11.5–15.5)
WBC: 11.1 10*3/uL — ABNORMAL HIGH (ref 4.0–10.5)
nRBC: 0 % (ref 0.0–0.2)

## 2018-05-21 LAB — MRSA PCR SCREENING: MRSA by PCR: NEGATIVE

## 2018-05-21 LAB — LACTIC ACID, PLASMA
Lactic Acid, Venous: 0.8 mmol/L (ref 0.5–1.9)
Lactic Acid, Venous: 0.8 mmol/L (ref 0.5–1.9)

## 2018-05-21 LAB — GLUCOSE, CAPILLARY
GLUCOSE-CAPILLARY: 151 mg/dL — AB (ref 70–99)
GLUCOSE-CAPILLARY: 216 mg/dL — AB (ref 70–99)
Glucose-Capillary: 159 mg/dL — ABNORMAL HIGH (ref 70–99)

## 2018-05-21 LAB — HEMOGLOBIN A1C
Hgb A1c MFr Bld: 7.9 % — ABNORMAL HIGH (ref 4.8–5.6)
Mean Plasma Glucose: 180.03 mg/dL

## 2018-05-21 LAB — PREALBUMIN: Prealbumin: 18.9 mg/dL (ref 18–38)

## 2018-05-21 MED ORDER — ASPIRIN EC 81 MG PO TBEC
81.0000 mg | DELAYED_RELEASE_TABLET | Freq: Every day | ORAL | Status: DC
  Administered 2018-05-21 – 2018-05-22 (×2): 81 mg via ORAL
  Filled 2018-05-21 (×2): qty 1

## 2018-05-21 MED ORDER — HEPARIN SODIUM (PORCINE) 5000 UNIT/ML IJ SOLN
5000.0000 [IU] | Freq: Three times a day (TID) | INTRAMUSCULAR | Status: DC
  Administered 2018-05-21 – 2018-05-22 (×3): 5000 [IU] via SUBCUTANEOUS
  Filled 2018-05-21 (×3): qty 1

## 2018-05-21 MED ORDER — INSULIN ASPART 100 UNIT/ML ~~LOC~~ SOLN: 0.0000 [IU] | Freq: Every day | SUBCUTANEOUS | Status: DC

## 2018-05-21 MED ORDER — FERROUS SULFATE 325 (65 FE) MG PO TABS
325.0000 mg | ORAL_TABLET | Freq: Every day | ORAL | Status: DC
  Administered 2018-05-22: 325 mg via ORAL
  Filled 2018-05-21: qty 1

## 2018-05-21 MED ORDER — LISINOPRIL 20 MG PO TABS
20.0000 mg | ORAL_TABLET | Freq: Every day | ORAL | Status: DC
  Administered 2018-05-22: 20 mg via ORAL
  Filled 2018-05-21: qty 1

## 2018-05-21 MED ORDER — IBUPROFEN 200 MG PO TABS
400.0000 mg | ORAL_TABLET | Freq: Four times a day (QID) | ORAL | Status: DC | PRN
  Administered 2018-05-21 (×2): 400 mg via ORAL
  Filled 2018-05-21 (×2): qty 2

## 2018-05-21 MED ORDER — VANCOMYCIN HCL 10 G IV SOLR
1750.0000 mg | Freq: Once | INTRAVENOUS | Status: AC
  Administered 2018-05-21: 1750 mg via INTRAVENOUS
  Filled 2018-05-21: qty 1750

## 2018-05-21 MED ORDER — CITALOPRAM HYDROBROMIDE 20 MG PO TABS
20.0000 mg | ORAL_TABLET | Freq: Every day | ORAL | Status: DC
  Administered 2018-05-22: 20 mg via ORAL
  Filled 2018-05-21: qty 1

## 2018-05-21 MED ORDER — INSULIN ASPART 100 UNIT/ML ~~LOC~~ SOLN
0.0000 [IU] | Freq: Three times a day (TID) | SUBCUTANEOUS | Status: DC
  Administered 2018-05-21: 3 [IU] via SUBCUTANEOUS
  Administered 2018-05-22: 2 [IU] via SUBCUTANEOUS

## 2018-05-21 MED ORDER — ATORVASTATIN CALCIUM 10 MG PO TABS
20.0000 mg | ORAL_TABLET | Freq: Every day | ORAL | Status: DC
  Administered 2018-05-21 – 2018-05-22 (×2): 20 mg via ORAL
  Filled 2018-05-21 (×2): qty 2

## 2018-05-21 MED ORDER — NICOTINE 14 MG/24HR TD PT24
14.0000 mg | MEDICATED_PATCH | Freq: Every day | TRANSDERMAL | Status: DC
  Filled 2018-05-21: qty 1

## 2018-05-21 MED ORDER — ACETAMINOPHEN 325 MG PO TABS
650.0000 mg | ORAL_TABLET | Freq: Four times a day (QID) | ORAL | Status: DC | PRN
  Administered 2018-05-22: 650 mg via ORAL
  Filled 2018-05-21: qty 2

## 2018-05-21 MED ORDER — LISINOPRIL 20 MG PO TABS
20.0000 mg | ORAL_TABLET | Freq: Once | ORAL | Status: AC
  Administered 2018-05-21: 20 mg via ORAL
  Filled 2018-05-21: qty 1

## 2018-05-21 MED ORDER — METOPROLOL TARTRATE 25 MG PO TABS
25.0000 mg | ORAL_TABLET | Freq: Once | ORAL | Status: AC
  Administered 2018-05-21: 25 mg via ORAL
  Filled 2018-05-21: qty 1

## 2018-05-21 MED ORDER — VANCOMYCIN HCL IN DEXTROSE 1-5 GM/200ML-% IV SOLN
1000.0000 mg | INTRAVENOUS | Status: DC
  Administered 2018-05-22: 1000 mg via INTRAVENOUS
  Filled 2018-05-21: qty 200

## 2018-05-21 MED ORDER — ACETAMINOPHEN 650 MG RE SUPP: 650.0000 mg | Freq: Four times a day (QID) | RECTAL | Status: DC | PRN

## 2018-05-21 MED ORDER — METOPROLOL TARTRATE 25 MG PO TABS
25.0000 mg | ORAL_TABLET | Freq: Two times a day (BID) | ORAL | Status: DC
  Administered 2018-05-21 – 2018-05-22 (×2): 25 mg via ORAL
  Filled 2018-05-21 (×2): qty 1

## 2018-05-21 NOTE — Plan of Care (Signed)
?  Problem: Education: ?Goal: Knowledge of General Education information will improve ?Description: Including pain rating scale, medication(s)/side effects and non-pharmacologic comfort measures ?Outcome: Progressing ?  ?Problem: Health Behavior/Discharge Planning: ?Goal: Ability to manage health-related needs will improve ?Outcome: Progressing ?  ?Problem: Clinical Measurements: ?Goal: Diagnostic test results will improve ?Outcome: Progressing ?Goal: Cardiovascular complication will be avoided ?Outcome: Progressing ?  ?

## 2018-05-21 NOTE — ED Triage Notes (Signed)
History of amputation left greater toe and have a callus bottom and medial aspect of left. Notice today blood medial side of foot. Bleeding controlled. Pain 8/10 pressure has history of neuropathy, DM, and HTN.

## 2018-05-21 NOTE — Progress Notes (Signed)
Pharmacy Antibiotic Note  [name] is a 48 y.o. male admitted on 05/21/2018 with foot pain and bleeding.  Pharmacy has been consulted for vancomycin dosing for osteomyelitis.  Patient has a history of left greater toe amputation due to osteomyelitis.  SCr 1.65 (SCr 1-1.3 in 2018), CrCL 54 ml/min - afebrile, WBC 11.1, LA 0.8.  Plan: Vanc 1750mg  IV x 1, then 1gm IV Q24H for AUC 458 using SCr 1.65 Monitor renal fxn, clinical progress, vanc level as indicated   Height: 5\' 4"  (162.6 cm) Weight: 189 lb 3 oz (85.8 kg) IBW/kg (Calculated) : 59.2  Temp (24hrs), Avg:98.3 F (36.8 C), Min:98.3 F (36.8 C), Max:98.3 F (36.8 C)  Recent Labs  Lab 05/21/18 0841 05/21/18 0849  WBC  --  11.1*  CREATININE  --  1.65*  LATICACIDVEN 0.8  --     Estimated Creatinine Clearance: 54.1 mL/min (A) (by C-G formula based on SCr of 1.65 mg/dL (H)).    Allergies  Allergen Reactions  . Bee Venom Swelling    SWELLING REACTION UNSPECIFIED   . Penicillins Hives and Other (See Comments)    PATIENT HAS HAD A PCN REACTION WITH IMMEDIATE RASH, FACIAL/TONGUE/THROAT SWELLING, SOB, OR LIGHTHEADEDNESS WITH HYPOTENSION:  #  #  #  YES  #  #  #   Has patient had a PCN reaction causing severe rash involving mucus membranes or skin necrosis: No Has patient had a PCN reaction that required hospitalization: No Has patient had a PCN reaction occurring within the last 10 years: No Allergy Severity noted prior to 05/28/17     Vanc 2/23 >>  2/23 BCx -   [name] D. Mina Marble, PharmD, BCPS, [place] 05/21/2018, 10:24 AM

## 2018-05-21 NOTE — Consult Note (Signed)
ORTHOPAEDIC CONSULTATION  REQUESTING PHYSICIAN: Barb Merino, MD  Chief Complaint: Bleeding with ulcerative callus left foot.  HPI: [name] is a 48 y.o. male who presents with acute bleeding left foot with chronic ulcers of both feet status post bilateral great toe amputations.  Past Medical History:  Diagnosis Date  . Diabetes mellitus type II, uncontrolled ([place])   . Hyperlipidemia   . Hypertension   . Other and unspecified angina pectoris   . Tobacco use    Past Surgical History:  Procedure Laterality Date  . AMPUTATION Left 08/22/2016   Procedure: GREAT TOE AMPUTATION;  Surgeon: [name], MD;  Location: [facility];  Service: Orthopedics;  Laterality: Left;  . AMPUTATION Right 05/28/2017   Procedure: AMPUTATION 1st & 3rd TOE;  Surgeon: [name], MD;  Location: [place];  Service: Orthopedics;  Laterality: Right;  . EYE SURGERY    . LEFT HEART CATHETERIZATION WITH CORONARY ANGIOGRAM N/A 07/31/2013   Procedure: LEFT HEART CATHETERIZATION WITH CORONARY ANGIOGRAM;  Surgeon: Jettie Booze, MD;  Location: [facility] CATH LAB;  Service: Cardiovascular;  Laterality: N/A;  . spinal tap    . toe amputated     Social History   Socioeconomic History  . Marital status: Legally Separated    Spouse name: Not on file  . Number of children: Not on file  . Years of education: Not on file  . Highest education level: Not on file  Occupational History  . Occupation: Leisure centre manager: [employer]  Social Needs  . Financial resource strain: Not on file  . Food insecurity:    Worry: Not on file    Inability: Not on file  . Transportation needs:    Medical: Not on file    Non-medical: Not on file  Tobacco Use  . Smoking status: Current Every Day Smoker    Packs/day: 1.00    Years: 20.00    Pack years: 20.00    Types: Cigarettes  . Smokeless tobacco: Never Used  . Tobacco comment: wants patches   Substance and Sexual Activity  . Alcohol use: No   Comment: occ  . Drug use: No  . Sexual activity: Not Currently  Lifestyle  . Physical activity:    Days per week: Not on file    Minutes per session: Not on file  . Stress: Not on file  Relationships  . Social connections:    Talks on phone: Not on file    Gets together: Not on file    Attends religious service: Not on file    Active member of club or organization: Not on file    Attends meetings of clubs or organizations: Not on file    Relationship status: Not on file  Other Topics Concern  . Not on file  Social History Narrative   Works in [employer], recently moved in early 2018 from [place]. Previous saw free clinic providers.       Living with and helping take are of his elderly father.   Family History  Problem Relation Age of Onset  . Cancer Mother   . Diabetes Father   . Hypertension Father    - negative except otherwise stated in the family history section Allergies  Allergen Reactions  . Bee Venom Swelling    SWELLING REACTION UNSPECIFIED   . Penicillins Hives and Other (See Comments)    PATIENT HAS HAD A PCN REACTION WITH IMMEDIATE RASH, FACIAL/TONGUE/THROAT SWELLING,  SOB, OR LIGHTHEADEDNESS WITH HYPOTENSION:  #  #  #  YES  #  #  #   Has patient had a PCN reaction causing severe rash involving mucus membranes or skin necrosis: No Has patient had a PCN reaction that required hospitalization: No Has patient had a PCN reaction occurring within the last 10 years: No Allergy Severity noted prior to 05/28/17    Prior to Admission medications   Medication Sig Start Date End Date Taking? Authorizing Provider  aspirin EC 81 MG tablet Take 81 mg by mouth daily. 02/11/16  Yes [provider]  atorvastatin (LIPITOR) 20 MG tablet Take 1 tablet (20 mg total) by mouth daily. 06/01/17 05/21/18 Yes Plotnikov, Evie Lacks, MD  citalopram (CELEXA) 20 MG tablet Take 1 tablet (20 mg total) by mouth daily. 06/01/17 06/01/18 Yes Plotnikov, Evie Lacks, MD  ferrous sulfate 325  (65 FE) MG tablet Take 1 tablet (325 mg total) by mouth daily. 06/02/17 06/02/18 Yes Plotnikov, Evie Lacks, MD  ibuprofen (ADVIL,MOTRIN) 400 MG tablet Take 1 tablet (400 mg total) by mouth every 8 (eight) hours as needed. Patient taking differently: Take 400 mg by mouth every 8 (eight) hours as needed for mild pain.  05/03/18  Yes Jola Schmidt, MD  lisinopril (PRINIVIL,ZESTRIL) 20 MG tablet Take 1 tablet (20 mg total) by mouth daily. 06/01/17 05/21/18 Yes Plotnikov, Evie Lacks, MD  metFORMIN (GLUCOPHAGE) 500 MG tablet Take 1 tablet (500 mg total) by mouth 2 (two) times daily with a meal. 06/01/17  Yes Plotnikov, Evie Lacks, MD  metoprolol tartrate (LOPRESSOR) 25 MG tablet Take 1 tablet (25 mg total) by mouth 2 (two) times daily. 06/01/17 05/21/18 Yes Plotnikov, Evie Lacks, MD  blood glucose meter kit and supplies KIT Dispense based on patient and insurance preference. Use up to four times daily as directed. (FOR ICD-9 250.00, 250.01). 08/25/16   Minus Liberty, MD  gabapentin (NEURONTIN) 300 MG capsule Take 1 capsule (300 mg total) by mouth 3 (three) times daily. Patient not taking: Reported on 05/03/2018 06/01/17 08/30/17  Plotnikov, Evie Lacks, MD   Mr Foot Left Wo Contrast  Result Date: 05/21/2018 CLINICAL DATA:  Chronic left foot pain and swelling in a diabetic patient. Wound on the medial aspect of the left foot. EXAM: MRI OF THE LEFT FOOT WITHOUT CONTRAST TECHNIQUE: Multiplanar, multisequence MR imaging of the left forefoot was performed. No intravenous contrast was administered. COMPARISON:  Plain films left foot 05/21/2018 and 08/20/2016. MRI left foot 08/21/2016. FINDINGS: Bones/Joint/Cartilage As seen on the prior study, the patient is status post amputation of the great toe. Charcot change is seen about the tarsometatarsal joints with subluxation of first through fourth metatarsals laterally, early bony destructive change about the tarsometatarsal joints, joint effusions at the first, second and third  tarsometatarsal joints and marrow edema about the joints. Edema extends into the neck of the first metatarsal, mid diaphysis of the second metatarsal and mid diaphysis of the third metatarsal. There is also some marrow edema in the proximal fourth and fifth metatarsals. Remote healed fracture of distal third metatarsal is identified. Mild edema is seen in the head of the second metatarsal. Sclerosis and thickening of the base of the proximal phalanx of the second toe is identified. Bone marrow signal is otherwise normal. Ligaments Intact. Muscles and Tendons Intermediate increased T2 signal in intrinsic musculature the foot is likely due to diabetic myopathy. Soft tissues There is subcutaneous edema about the foot. No abscess is identified. The patient's skin wound is not  visualized. IMPRESSION: Charcot change at the articulations of the midfoot. Marrow edema in all of the metatarsals and throughout the cuneiforms and visualized cuboid is likely due to stress change related to neuropathic disease. Osteomyelitis is possible but the appearance is not typical for osteomyelitis. Negative for abscess. Status post amputation of the great toe. Remote healed third metatarsal fracture. Sclerosis and thickening of the base the proximal phalanx of the second toe likely due to remote osteomyelitis. Electronically Signed   By: Inge Rise M.D.   On: 05/21/2018 14:54   Dg Foot Complete Left  Result Date: 05/21/2018 CLINICAL DATA:  Left foot wound for 6 months. EXAM: LEFT FOOT - COMPLETE 3+ VIEW COMPARISON:  08/20/2016 FINDINGS: There is an abnormal appearance of the bases of the first through fifth metatarsal bones with subchondral osteolytic changes. Similar but less pronounced osteolytic changes are seen within the cuneiform bones and the cuboid bone at the tarsometatarsal junctions. There is a widening of the first Lisfranc joint, consistent with disruption of the associated ligaments. Mixed lytic and sclerotic  changes and remodeling a seen of the proximal second phalanx. There has been a prior first toe amputation at the metatarsophalangeal joint. There is diffuse soft tissue swelling of the foot. No soft tissue emphysema is seen. IMPRESSION: 1. Abnormal appearance of the bases of the first through fifth metatarsal bones with irregular subchondral osteolytic changes. Similar changes within the cuneiform bones and the cuboid bone at the tarsometatarsal junctions. Findings are consistent with osteomyelitis. 2. Widening of the first Lisfranc joint, consistent with joint disruption. 3. Diffuse soft tissue edema without soft tissue emphysema. Electronically Signed   By: Fidela Salisbury M.D.   On: 05/21/2018 10:03   - pertinent xrays, CT, MRI studies were reviewed and independently interpreted  Positive ROS: All other systems have been reviewed and were otherwise negative with the exception of those mentioned in the HPI and as above.  Physical Exam: General: Alert, no acute distress Psychiatric: Patient is competent for consent with normal mood and affect Lymphatic: No axillary or cervical lymphadenopathy Cardiovascular: No pedal edema Respiratory: No cyanosis, no use of accessory musculature GI: No organomegaly, abdomen is soft and non-tender    Images:  _0 @  Labs:  Lab Results  Component Value Date   HGBA1C 7.9 (H) 05/21/2018   HGBA1C 11.3 (H) 06/01/2017   HGBA1C 9.8 (H) 08/20/2016   ESRSEDRATE 54 (H) 08/20/2016   ESRSEDRATE 8 07/24/2012   ESRSEDRATE 4 04/19/2008   CRP 2.2 (H) 08/20/2016   REPTSTATUS PENDING 05/21/2018   GRAMSTAIN  07/24/2012    CYTOSPIN NO WBC SEEN NO ORGANISMS SEEN Gram Stain Report Called to,Read Back By and Verified With: [id number]   CULT NO GROWTH < 12 HOURS 05/21/2018    Lab Results  Component Value Date   ALBUMIN 3.2 (L) 06/01/2017   ALBUMIN 2.8 (L) 05/25/2017   ALBUMIN 2.8 (L) 08/24/2016    Neurologic: Patient does not have  protective sensation bilateral lower extremities.   MUSCULOSKELETAL:   Skin: Examination patient has no ascending cellulitis.  He has thick callus on the plantar aspect of both feet.  There is no fluctuance no abscess no cellulitis there is no ulcer that probes to bone on either foot.  He does not have a palpable dorsalis pedis pulse.  Calf is nontender.  Hemoglobin A1c has improved and is currently 7.9.  Patient albumin is also improved at 3.2.  I have reviewed the MRI scan and patient does have edema throughout the metatarsals  which has the appearance more of a stress reaction.  There is no abscess.  The MRI scan appears more consistent with Charcot arthropathy than osteomyelitis.    Assessment: Assessment: Diabetic insensate neuropathy with Charcot arthropathy with acute bleeding but no definite osteomyelitis.  Plan: Plan: Would recommend several days of IV antibiotics discharge on oral doxycycline and I will follow-up in the office in 1 week for wound care.  Thank you for the consult and the opportunity to see [name], [place] [phone] 5:27 PM

## 2018-05-21 NOTE — H&P (Signed)
History and Physical    ACE BERGFELD DUK:[id number] DOB: 12/22/1970 DOA: 05/21/2018  PCP: Cassandria Anger, MD  Patient coming from: home   I have personally briefly reviewed patient's old medical records available.   Chief Complaint: left foot pain and bleeding   HPI: [name] is a 48 y.o. male with medical history significant of type 2 diabetes currently on metformin, hypertension, CKD stage III, previous history of diabetic foot infections and bilateral toe amputations who is presenting to the emergency room with an episode of bleeding from his left foot and ongoing pain for more than 6 months.  According to the patient, he had amputation of the right foot toes about 2 years ago and then he had amputation of left foot toes about a year ago and has been doing well.  He says that his left foot is somehow more swollen for last many months and has a big callus on his foot. Today, while he was coming to work, he noticed some blood on the medial aspect of the left foot that prompted him to come to the emergency room.  Patient is having worsening pain, about 8 out of 10, more with ambulation and weightbearing, is stated going on for more than 6 months and now worse.  Denies any fever or chills.  His left foot is slightly bigger than the right, however he thinks it is his usual self. Patient really does not want to stay in the hospital. ED Course: Blood pressures were high, otherwise is stable.  Afebrile.  WBC count is 11.  Creatinine 1.65.  Blood sugars 188.  X-ray of the left foot shows multiple metatarsal head osteomyelitic changes, unsure whether acute or chronic.  Blood cultures were drawn and patient was given vancomycin in the ER.  Case was discussed with orthopedics and admission requested.  Review of Systems: As per HPI otherwise 10 point review of systems negative.    Past Medical History:  Diagnosis Date  . Diabetes mellitus type II, uncontrolled ([place])   . Hyperlipidemia     . Hypertension   . Other and unspecified angina pectoris   . Tobacco use     Past Surgical History:  Procedure Laterality Date  . AMPUTATION Left 08/22/2016   Procedure: GREAT TOE AMPUTATION;  Surgeon: [name], MD;  Location: [place];  Service: Orthopedics;  Laterality: Left;  . AMPUTATION Right 05/28/2017   Procedure: AMPUTATION 1st & 3rd TOE;  Surgeon: [name], MD;  Location: [place];  Service: Orthopedics;  Laterality: Right;  . EYE SURGERY    . LEFT HEART CATHETERIZATION WITH CORONARY ANGIOGRAM N/A 07/31/2013   Procedure: LEFT HEART CATHETERIZATION WITH CORONARY ANGIOGRAM;  Surgeon: Jettie Booze, MD;  Location: [facility] CATH LAB;  Service: Cardiovascular;  Laterality: N/A;  . spinal tap    . toe amputated       reports that he has been smoking cigarettes. He has a 20.00 pack-year smoking history. He has never used smokeless tobacco. He reports that he does not drink alcohol or use drugs.  Allergies  Allergen Reactions  . Bee Venom Swelling    SWELLING REACTION UNSPECIFIED   . Penicillins Hives and Other (See Comments)    PATIENT HAS HAD A PCN REACTION WITH IMMEDIATE RASH, FACIAL/TONGUE/THROAT SWELLING, SOB, OR LIGHTHEADEDNESS WITH HYPOTENSION:  #  #  #  YES  #  #  #   Has patient had a PCN reaction causing severe rash involving mucus membranes or skin  necrosis: No Has patient had a PCN reaction that required hospitalization: No Has patient had a PCN reaction occurring within the last 10 years: No Allergy Severity noted prior to 05/28/17     Family History  Problem Relation Age of Onset  . Cancer Mother   . Diabetes Father   . Hypertension Father      Prior to Admission medications   Medication Sig Start Date End Date Taking? Authorizing Provider  aspirin EC 81 MG tablet Take 81 mg by mouth daily. 02/11/16   [provider]  atorvastatin (LIPITOR) 20 MG tablet Take 1 tablet (20 mg total) by mouth daily. 06/01/17 05/03/18  Plotnikov, Evie Lacks, MD  blood  glucose meter kit and supplies KIT Dispense based on patient and insurance preference. Use up to four times daily as directed. (FOR ICD-9 250.00, 250.01). 08/25/16   Minus Liberty, MD  citalopram (CELEXA) 20 MG tablet Take 1 tablet (20 mg total) by mouth daily. 06/01/17 06/01/18  Plotnikov, Evie Lacks, MD  ferrous sulfate 325 (65 FE) MG tablet Take 1 tablet (325 mg total) by mouth daily. 06/02/17 06/02/18  Plotnikov, Evie Lacks, MD  gabapentin (NEURONTIN) 300 MG capsule Take 1 capsule (300 mg total) by mouth 3 (three) times daily. Patient not taking: Reported on 05/03/2018 06/01/17 08/30/17  Plotnikov, Evie Lacks, MD  ibuprofen (ADVIL,MOTRIN) 400 MG tablet Take 1 tablet (400 mg total) by mouth every 8 (eight) hours as needed. 05/03/18   Jola Schmidt, MD  lisinopril (PRINIVIL,ZESTRIL) 20 MG tablet Take 1 tablet (20 mg total) by mouth daily. 06/01/17 05/03/18  Plotnikov, Evie Lacks, MD  metFORMIN (GLUCOPHAGE) 500 MG tablet Take 1 tablet (500 mg total) by mouth 2 (two) times daily with a meal. 06/01/17   Plotnikov, Evie Lacks, MD  metoprolol tartrate (LOPRESSOR) 25 MG tablet Take 1 tablet (25 mg total) by mouth 2 (two) times daily. 06/01/17 05/03/18  Plotnikov, Evie Lacks, MD    Physical Exam: Vitals:   05/21/18 0810 05/21/18 1022 05/21/18 1028 05/21/18 1214  BP:  (!) 207/112 (!) 207/112 (!) 117/104  Pulse:  77 77 78  Resp:  _0 Temp:   97.8 F (36.6 C) 98.7 F (37.1 C)  TempSrc:   Oral Oral  SpO2:  100% 99% 98%  Weight: 85.8 kg     Height: _1  (1.626 m)       Constitutional: NAD, calm, comfortable Vitals:   05/21/18 0810 05/21/18 1022 05/21/18 1028 05/21/18 1214  BP:  (!) 207/112 (!) 207/112 (!) 117/104  Pulse:  77 77 78  Resp:  _2 Temp:   97.8 F (36.6 C) 98.7 F (37.1 C)  TempSrc:   Oral Oral  SpO2:  100% 99% 98%  Weight: 85.8 kg     Height: _3  (1.626 m)      Eyes: PERRL, lids and conjunctivae normal ENMT: Mucous membranes are moist. Posterior pharynx clear of any exudate or  lesions.Normal dentition.  Neck: normal, supple, no masses, no thyromegaly Respiratory: clear to auscultation bilaterally, no wheezing, no crackles. Normal respiratory effort. No accessory muscle use.  Cardiovascular: Regular rate and rhythm, no murmurs / rubs / gallops. No extremity edema. 2+ pedal pulses. No carotid bruits.  Abdomen: no tenderness, no masses palpated. No hepatosplenomegaly. Bowel sounds positive.  Skin: no rashes, lesions, ulcers. No induration Neurologic: CN 2-12 grossly intact. Sensation intact, DTR normal. Strength 5/5 in all 4.  Psychiatric: Normal judgment and insight. Alert and oriented x 3. Normal  mood.   Musculoskeletal:  Right foot with multiple toe amputations, clean and dry and nontender. Left foot with great toe amputation, callus with fluctuation on the medial aspect of the foot. There is generalized swelling but no localized erythema or redness.  No drainage. No localized tenderness on palpation.  Labs on Admission: I have personally reviewed following labs and imaging studies  CBC: Recent Labs  Lab 05/21/18 0849  WBC 11.1*  NEUTROABS 7.4  HGB 11.7*  HCT 37.5*  MCV 88.0  PLT 542   Basic Metabolic Panel: Recent Labs  Lab 05/21/18 0849  NA 138  K 4.1  CL 106  CO2 26  GLUCOSE 188*  BUN 18  CREATININE 1.65*  CALCIUM 9.2   GFR: Estimated Creatinine Clearance: 54.1 mL/min (A) (by C-G formula based on SCr of 1.65 mg/dL (H)). Liver Function Tests: No results for input(s): AST, ALT, ALKPHOS, BILITOT, PROT, ALBUMIN in the last 168 hours. No results for input(s): LIPASE, AMYLASE in the last 168 hours. No results for input(s): AMMONIA in the last 168 hours. Coagulation Profile: No results for input(s): INR, PROTIME in the last 168 hours. Cardiac Enzymes: No results for input(s): CKTOTAL, CKMB, CKMBINDEX, TROPONINI in the last 168 hours. BNP (last 3 results) No results for input(s): PROBNP in the last 8760 hours. HbA1C: No results for  input(s): HGBA1C in the last 72 hours. CBG: No results for input(s): GLUCAP in the last 168 hours. Lipid Profile: No results for input(s): CHOL, HDL, LDLCALC, TRIG, CHOLHDL, LDLDIRECT in the last 72 hours. Thyroid Function Tests: No results for input(s): TSH, T4TOTAL, FREET4, T3FREE, THYROIDAB in the last 72 hours. Anemia Panel: No results for input(s): VITAMINB12, FOLATE, FERRITIN, TIBC, IRON, RETICCTPCT in the last 72 hours. Urine analysis:    Component Value Date/Time   COLORURINE YELLOW 06/01/2017 1602   APPEARANCEUR CLEAR 06/01/2017 1602   LABSPEC >=1.030 (A) 06/01/2017 1602   PHURINE 5.0 06/01/2017 1602   GLUCOSEU NEGATIVE 06/01/2017 1602   HGBUR LARGE (A) 06/01/2017 1602   BILIRUBINUR NEGATIVE 06/01/2017 1602   KETONESUR NEGATIVE 06/01/2017 1602   PROTEINUR >=300 (A) 05/25/2017 1300   UROBILINOGEN 1.0 06/01/2017 1602   NITRITE NEGATIVE 06/01/2017 1602   LEUKOCYTESUR NEGATIVE 06/01/2017 1602    Radiological Exams on Admission: Dg Foot Complete Left  Result Date: 05/21/2018 CLINICAL DATA:  Left foot wound for 6 months. EXAM: LEFT FOOT - COMPLETE 3+ VIEW COMPARISON:  08/20/2016 FINDINGS: There is an abnormal appearance of the bases of the first through fifth metatarsal bones with subchondral osteolytic changes. Similar but less pronounced osteolytic changes are seen within the cuneiform bones and the cuboid bone at the tarsometatarsal junctions. There is a widening of the first Lisfranc joint, consistent with disruption of the associated ligaments. Mixed lytic and sclerotic changes and remodeling a seen of the proximal second phalanx. There has been a prior first toe amputation at the metatarsophalangeal joint. There is diffuse soft tissue swelling of the foot. No soft tissue emphysema is seen. IMPRESSION: 1. Abnormal appearance of the bases of the first through fifth metatarsal bones with irregular subchondral osteolytic changes. Similar changes within the cuneiform bones and the  cuboid bone at the tarsometatarsal junctions. Findings are consistent with osteomyelitis. 2. Widening of the first Lisfranc joint, consistent with joint disruption. 3. Diffuse soft tissue edema without soft tissue emphysema. Electronically Signed   By: Fidela Salisbury M.D.   On: 05/21/2018 10:03     Assessment/Plan Principal Problem:   Osteomyelitis ([name]) Active Problems:  Diabetes mellitus with neurological manifestations, uncontrolled ([place])   Hypertension associated with diabetes ([place])   Tobacco use disorder   Osteomyelitis of foot, acute ([place])     1.Osteomyelitis of the multiple bones of left foot: Suspect subacute osteomyelitis.  Continue vancomycin with levels.  Will check MRI of the foot.  Patient will be followed by orthopedics.  Blood cultures were drawn, however do not anticipate positive growth because of no systemic evidence of infection.  Adequate pain medications.  2.  Type 2 diabetes with uncontrolled blood sugars: Patient only on metformin at home.  Unsure about his diabetic control.  Will check A1c.  Keep on sliding scale insulin.  May need to uptitrate his medications depends upon the results.  3.  Hypertension: Accelerated blood pressures on arrival.He had missed his morning doses of medications.  Blood pressure medications were given with fair control.  4.  Diabetic neuropathy: On gabapentin that he will continue.  DVT prophylaxis: Heparin subcu Code Status: Full code Family Communication: No family at bedside Disposition Plan: Home when is stable Consults called: Orthopedics Admission status: Inpatient   Barb Merino MD [facility] Pager [phone]  If 7PM-7AM, please contact night-coverage [email or web address] Password [facility]  05/21/2018, 12:18 PM

## 2018-05-21 NOTE — ED Provider Notes (Addendum)
[facility] EMERGENCY DEPARTMENT Provider Note   CSN: [id number] Arrival date & time: 05/21/18  0801    History   Chief Complaint Chief Complaint  Patient presents with  . Foot Pain  . Wound Check    HPI [name] is a 48 y.o. male.     The history is provided by the patient and medical records. No language interpreter was used.  Foot Pain   Wound Check     [name] is a 48 y.o. 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  Patient states that he has had this callus for a month or so, but the skin began bleeding today.  He has had progressively worsening pain over the last several days, but today, he went to work and occurred fairly well, he felt it was so painful.  Pain is described as sharp.  He saw his doctor about 2 weeks ago who started him on doxycycline for the above issue and he does not feel like it is been helpful. Per chart review, it actually appears that he was placed on this medication because of a scalp abscess, not his foot pain. Did take this medication until completed.  No fever or chills.  Of note, hypertensive in triage.  He did not take either of his blood pressure medications today.  Denies any chest pain or shortness of breath.  No headaches or abdominal pain.  No visual changes, lightheadedness or syncopal episodes.   Past Medical History:  Diagnosis Date  . Diabetes mellitus type II, uncontrolled ([place])   . Hyperlipidemia   . Hypertension   . Other and unspecified angina pectoris   . Tobacco use     Patient Active Problem List   Diagnosis Date Noted  . Wound cellulitis 05/22/2018  . Osteomyelitis of foot, acute ([place]) 05/21/2018  . 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 Diabetic ulcer of foot with fat layer  exposed ([place]) 08/26/2017  . B12 deficiency 06/15/2017  . Vitamin D deficiency 06/03/2017  . Iron deficiency anemia 06/01/2017  . Diabetic foot infection ([place])   . Osteomyelitis ([place]) 05/25/2017  . Vitreous floaters of right eye 09/15/2016  . Non compliance w medication regimen 09/15/2016  . Depression 09/01/2016  . Foot ulcer due to secondary DM ([place]) 08/20/2016  . Hidradenitis suppurativa 10/13/2015  . Peripheral polyneuropathy 07/07/2015  . Coronary atherosclerosis of native coronary artery 08/17/2013  . Diabetic retinopathy ([place]) 02/02/2013  . Diabetic neuropathy ([place]) 12/01/2012  . Hypertension associated with diabetes ([place]) 08/03/2012  . Tobacco use disorder 08/03/2012  . Erectile dysfunction 08/03/2012  . Diabetes mellitus with neurological manifestations, uncontrolled ([place]) 05/26/2006    Past Surgical History:  Procedure Laterality Date  . AMPUTATION Left 08/22/2016   Procedure: GREAT TOE AMPUTATION;  Surgeon: [name], MD;  Location: [facility];  Service: Orthopedics;  Laterality: Left;  . AMPUTATION Right 05/28/2017   Procedure: AMPUTATION 1st & 3rd TOE;  Surgeon: [name], MD;  Location: [facility];  Service: Orthopedics;  Laterality: Right;  . EYE SURGERY    . LEFT HEART CATHETERIZATION WITH CORONARY ANGIOGRAM N/A 07/31/2013   Procedure: LEFT HEART CATHETERIZATION WITH CORONARY ANGIOGRAM;  Surgeon: Jettie Booze, MD;  Location: [facility] CATH LAB;  Service: Cardiovascular;  Laterality: N/A;  . spinal tap    .  toe amputated          Home Medications    Prior to Admission medications   Medication Sig Start Date End Date Taking? Authorizing Provider  aspirin EC 81 MG tablet Take 81 mg by mouth daily. 02/11/16  Yes [provider]  atorvastatin (LIPITOR) 20 MG tablet Take 1 tablet (20 mg total) by mouth daily. 06/01/17 05/21/18 Yes Plotnikov, Evie Lacks, MD  citalopram (CELEXA) 20 MG tablet Take 1 tablet (20 mg total) by mouth daily. 06/01/17 06/01/18 Yes Plotnikov,  Evie Lacks, MD  ferrous sulfate 325 (65 FE) MG tablet Take 1 tablet (325 mg total) by mouth daily. 06/02/17 06/02/18 Yes Plotnikov, Evie Lacks, MD  lisinopril (PRINIVIL,ZESTRIL) 20 MG tablet Take 1 tablet (20 mg total) by mouth daily. 06/01/17 05/21/18 Yes Plotnikov, Evie Lacks, MD  metFORMIN (GLUCOPHAGE) 500 MG tablet Take 1 tablet (500 mg total) by mouth 2 (two) times daily with a meal. 06/01/17  Yes Plotnikov, Evie Lacks, MD  metoprolol tartrate (LOPRESSOR) 25 MG tablet Take 1 tablet (25 mg total) by mouth 2 (two) times daily. 06/01/17 05/21/18 Yes Plotnikov, Evie Lacks, MD  blood glucose meter kit and supplies KIT Dispense based on patient and insurance preference. Use up to four times daily as directed. (FOR ICD-9 250.00, 250.01). 08/25/16   Minus Liberty, MD  doxycycline (VIBRAMYCIN) 100 MG capsule Take 1 capsule (100 mg total) by mouth 2 (two) times daily. 05/22/18   Caren Griffins, MD  gabapentin (NEURONTIN) 300 MG capsule Take 1 capsule (300 mg total) by mouth 3 (three) times daily. Patient not taking: Reported on 05/03/2018 06/01/17 08/30/17  Plotnikov, Evie Lacks, MD    Family History Family History  Problem Relation Age of Onset  . Cancer Mother   . Diabetes Father   . Hypertension Father     Social History Social History   Tobacco Use  . Smoking status: Current Every Day Smoker    Packs/day: 1.00    Years: 20.00    Pack years: 20.00    Types: Cigarettes  . Smokeless tobacco: Never Used  . Tobacco comment: wants patches   Substance Use Topics  . Alcohol use: No    Comment: occ  . Drug use: No     Allergies   Bee venom and Penicillins   Review of Systems Review of Systems  Musculoskeletal: Positive for arthralgias and myalgias.  Skin: Positive for wound.  Neurological: Negative for weakness and numbness.  All other systems reviewed and are negative.    Physical Exam Updated Vital Signs BP (!) 168/97 (BP Location: Left Arm)   Pulse 77   Temp 98.3 F (36.8 C) (Oral)    Resp 16   Ht _0  (1.626 m)   Wt 89 kg   SpO2 100%   BMI 33.68 kg/m   Physical Exam Vitals signs and nursing note reviewed.  Constitutional:      General: He is not in acute distress.    Appearance: He is well-developed.  HENT:     Head: Normocephalic and atraumatic.  Neck:     Musculoskeletal: Neck supple.  Cardiovascular:     Heart sounds: Normal heart sounds. No murmur.     Comments: Tachycardic, but regular. Pulmonary:     Effort: Pulmonary effort is normal. No respiratory distress.     Breath sounds: Normal breath sounds.  Abdominal:     General: There is no distension.     Palpations: Abdomen is soft.     Tenderness: There  is no abdominal tenderness.  Musculoskeletal:     Comments: Tenderness and swelling to the arch of the left foot.  No overlying erythema. 2+ DP pulse. Sensation intact, however diminished which per patient is baseline due to his chronic diabetic neuropathy. Full ROM and strength to LE's.   Skin:    General: Skin is warm and dry.  Neurological:     Mental Status: He is alert and oriented to person, place, and time.      ED Treatments / Results  Labs (all labs ordered are listed, but only abnormal results are displayed) Labs Reviewed  CBC WITH DIFFERENTIAL/PLATELET - Abnormal; Notable for the following components:      Result Value   WBC 11.1 (*)    Hemoglobin 11.7 (*)    HCT 37.5 (*)    All other components within normal limits  BASIC METABOLIC PANEL - Abnormal; Notable for the following components:   Glucose, Bld 188 (*)    Creatinine, Ser 1.65 (*)    GFR calc non Af Amer 48 (*)    GFR calc Af Amer 56 (*)    All other components within normal limits  HEMOGLOBIN A1C - Abnormal; Notable for the following components:   Hgb A1c MFr Bld 7.9 (*)    All other components within normal limits  GLUCOSE, CAPILLARY - Abnormal; Notable for the following components:   Glucose-Capillary 151 (*)    All other components within normal limits  BASIC  METABOLIC PANEL - Abnormal; Notable for the following components:   Glucose, Bld 133 (*)    BUN 26 (*)    Creatinine, Ser 1.68 (*)    Calcium 8.4 (*)    GFR calc non Af Amer 47 (*)    GFR calc Af Amer 55 (*)    All other components within normal limits  CBC - Abnormal; Notable for the following components:   WBC 11.4 (*)    RBC 3.83 (*)    Hemoglobin 9.9 (*)    HCT 33.3 (*)    MCH 25.8 (*)    MCHC 29.7 (*)    All other components within normal limits  GLUCOSE, CAPILLARY - Abnormal; Notable for the following components:   Glucose-Capillary 216 (*)    All other components within normal limits  GLUCOSE, CAPILLARY - Abnormal; Notable for the following components:   Glucose-Capillary 159 (*)    All other components within normal limits  GLUCOSE, CAPILLARY - Abnormal; Notable for the following components:   Glucose-Capillary 125 (*)    All other components within normal limits  GLUCOSE, CAPILLARY - Abnormal; Notable for the following components:   Glucose-Capillary 177 (*)    All other components within normal limits  CULTURE, BLOOD (ROUTINE X 2)  CULTURE, BLOOD (ROUTINE X 2)  MRSA PCR SCREENING  LACTIC ACID, PLASMA  LACTIC ACID, PLASMA  HIV ANTIBODY (ROUTINE TESTING W REFLEX)  PREALBUMIN    EKG None  Radiology No results found.  Procedures Procedures (including critical care time)  CRITICAL CARE Performed by: Ozella Almond Ward  Total critical care time: 35 minutes  Critical care time was exclusive of separately billable procedures and treating other patients.  Critical care was necessary to treat or prevent imminent or life-threatening deterioration.  Critical care was time spent personally by me on the following activities: development of treatment plan with patient and/or surrogate as well as nursing, discussions with consultants, evaluation of patient's response to treatment, examination of patient, obtaining history from patient or surrogate, ordering  and  performing treatments and interventions, ordering and review of laboratory studies, ordering and review of radiographic studies, pulse oximetry and re-evaluation of patient's condition.   Medications Ordered in ED Medications  lisinopril (PRINIVIL,ZESTRIL) tablet 20 mg (20 mg Oral Given 05/21/18 0834)  metoprolol tartrate (LOPRESSOR) tablet 25 mg (25 mg Oral Given 05/21/18 0835)  vancomycin (VANCOCIN) 1,750 mg in sodium chloride 0.9 % 500 mL IVPB (0 mg Intravenous Stopped 05/21/18 1342)     Initial Impression / Assessment and Plan / ED Course  I have reviewed the triage vital signs and the nursing notes.  Pertinent labs & imaging results that were available during my care of the patient were reviewed by me and considered in my medical decision making (see chart for details).       [name] is a 48 y.o. male who presents to ED for worsening left foot pain.  Does have a history of diabetes as well as osteomyelitis requiring amputation of the great toe of this foot.  Given his worsening pain, leukocytosis of 11.1.  Blood cultures were obtained.  Fortunately, lactic is negative.  X-ray of the foot is concerning for osteomyelitis at the bases of his first through fifth metatarsal bones as well as within the cuneiform bones and the cuboid bone at the tarsometatarsal junction.  Given a dose of vancomycin in the ER.  Discussed with on-call orthopedics, Dr. Erlinda Hong.  He will inform Dr. Sharol Given that patient is here for consult. Hospitalist consulted who will admit.   Patient discussed with Dr. Roderic Palau who agrees with treatment plan.    Final Clinical Impressions(s) / ED Diagnoses   Final diagnoses:  Osteomyelitis of left foot, unspecified type [facility])  Uncontrolled hypertension    ED Discharge Orders         Ordered    doxycycline (VIBRAMYCIN) 100 MG capsule  2 times daily     05/22/18 0931           Ward, Ozella Almond, PA-C 05/21/18 1105    [name] Ferguson, MD 05/22/18 2254    Ward,  Ozella Almond, PA-C 05/31/18 1628    [name] Ferguson, MD 06/04/18 1452

## 2018-05-22 DIAGNOSIS — M86179 Other acute osteomyelitis, unspecified ankle and foot: Secondary | ICD-10-CM

## 2018-05-22 DIAGNOSIS — E11610 Type 2 diabetes mellitus with diabetic neuropathic arthropathy: Secondary | ICD-10-CM

## 2018-05-22 DIAGNOSIS — E1149 Type 2 diabetes mellitus with other diabetic neurological complication: Secondary | ICD-10-CM | POA: Diagnosis not present

## 2018-05-22 DIAGNOSIS — E1165 Type 2 diabetes mellitus with hyperglycemia: Secondary | ICD-10-CM

## 2018-05-22 DIAGNOSIS — I10 Essential (primary) hypertension: Secondary | ICD-10-CM

## 2018-05-22 DIAGNOSIS — E1159 Type 2 diabetes mellitus with other circulatory complications: Secondary | ICD-10-CM | POA: Diagnosis not present

## 2018-05-22 DIAGNOSIS — L0390 Cellulitis, unspecified: Secondary | ICD-10-CM | POA: Diagnosis present

## 2018-05-22 DIAGNOSIS — M86371 Chronic multifocal osteomyelitis, right ankle and foot: Secondary | ICD-10-CM | POA: Diagnosis not present

## 2018-05-22 LAB — BASIC METABOLIC PANEL
Anion gap: 8 (ref 5–15)
BUN: 26 mg/dL — ABNORMAL HIGH (ref 6–20)
CO2: 22 mmol/L (ref 22–32)
Calcium: 8.4 mg/dL — ABNORMAL LOW (ref 8.9–10.3)
Chloride: 107 mmol/L (ref 98–111)
Creatinine, Ser: 1.68 mg/dL — ABNORMAL HIGH (ref 0.61–1.24)
GFR calc Af Amer: 55 mL/min — ABNORMAL LOW (ref >60)
GFR, EST NON AFRICAN AMERICAN: 47 mL/min — AB (ref >60)
Glucose, Bld: 133 mg/dL — ABNORMAL HIGH (ref 70–99)
Potassium: 4 mmol/L (ref 3.5–5.1)
SODIUM: 137 mmol/L (ref 135–145)

## 2018-05-22 LAB — CBC
HCT: 33.3 % — ABNORMAL LOW (ref 39.0–52.0)
Hemoglobin: 9.9 g/dL — ABNORMAL LOW (ref 13.0–17.0)
MCH: 25.8 pg — ABNORMAL LOW (ref 26.0–34.0)
MCHC: 29.7 g/dL — ABNORMAL LOW (ref 30.0–36.0)
MCV: 86.9 fL (ref 80.0–100.0)
Platelets: 264 10*3/uL (ref 150–400)
RBC: 3.83 MIL/uL — ABNORMAL LOW (ref 4.22–5.81)
RDW: 14.5 % (ref 11.5–15.5)
WBC: 11.4 10*3/uL — ABNORMAL HIGH (ref 4.0–10.5)
nRBC: 0 % (ref 0.0–0.2)

## 2018-05-22 LAB — GLUCOSE, CAPILLARY
GLUCOSE-CAPILLARY: 125 mg/dL — AB (ref 70–99)
GLUCOSE-CAPILLARY: 177 mg/dL — AB (ref 70–99)

## 2018-05-22 LAB — HIV ANTIBODY (ROUTINE TESTING W REFLEX): HIV SCREEN 4TH GENERATION: NONREACTIVE

## 2018-05-22 MED ORDER — DOXYCYCLINE HYCLATE 100 MG PO CAPS: 100.0000 mg | ORAL_CAPSULE | Freq: Two times a day (BID) | ORAL | 0 refills | Status: DC

## 2018-05-22 NOTE — Plan of Care (Signed)
  Problem: Pain Managment: Goal: General experience of comfort will improve Outcome: Progressing   Problem: Safety: Goal: Ability to remain free from injury will improve Outcome: Progressing   Problem: Skin Integrity: Goal: Risk for impaired skin integrity will decrease Outcome: Progressing   

## 2018-05-22 NOTE — Progress Notes (Signed)
Provided discharge education/instructions, all questions and concerns addressed, Pt not in distress, discharged home with belongings.

## 2018-05-22 NOTE — Plan of Care (Signed)
  Problem: Health Behavior/Discharge Planning: Goal: Ability to manage health-related needs will improve Outcome: Progressing   Problem: Activity: Goal: Risk for activity intolerance will decrease Outcome: Progressing   Problem: Nutrition: Goal: Adequate nutrition will be maintained Outcome: Progressing   Problem: Coping: Goal: Level of anxiety will decrease Outcome: Progressing   Problem: Elimination: Goal: Will not experience complications related to bowel motility Outcome: Progressing   Problem: Pain Managment: Goal: General experience of comfort will improve Outcome: Progressing   Problem: Safety: Goal: Ability to remain free from injury will improve Outcome: Progressing   Problem: Skin Integrity: Goal: Risk for impaired skin integrity will decrease Outcome: Progressing   

## 2018-05-22 NOTE — Discharge Instructions (Signed)
Follow with Dr. Sharol Given whithin a week. Please call his office shortly after discharge  Please get a complete blood count and chemistry panel checked by your Primary MD at your next visit, and again as instructed by your Primary MD. Please get your medications reviewed and adjusted by your Primary MD.  Please request your Primary MD to go over all Hospital Tests and Procedure/Radiological results at the follow up, please get all Hospital records sent to your Prim MD by signing hospital release before you go home.  In some cases, there will be blood work, cultures and biopsy results pending at the time of your discharge. Please request that your primary care M.D. goes through all the records of your hospital data and follows up on these results.  If you had Pneumonia of Lung problems at the Hospital: Please get a 2 view Chest X ray done in 6-8 weeks after hospital discharge or sooner if instructed by your Primary MD.  If you have Congestive Heart Failure: Please call your Cardiologist or Primary MD anytime you have any of the following symptoms:  1) 3 pound weight gain in 24 hours or 5 pounds in 1 week  2) shortness of breath, with or without a dry hacking cough  3) swelling in the hands, feet or stomach  4) if you have to sleep on extra pillows at night in order to breathe  Follow cardiac low salt diet and 1.5 lit/day fluid restriction.  If you have diabetes Accuchecks 4 times/day, Once in AM empty stomach and then before each meal. Log in all results and show them to your primary doctor at your next visit. If any glucose reading is under 80 or above 300 call your primary MD immediately.  If you have Seizure/Convulsions/Epilepsy: Please do not drive, operate heavy machinery, participate in activities at heights or participate in high speed sports until you have seen by Primary MD or a Neurologist and advised to do so again.  If you had Gastrointestinal Bleeding: Please ask your Primary MD  to check a complete blood count within one week of discharge or at your next visit. Your endoscopic/colonoscopic biopsies that are pending at the time of discharge, will also need to followed by your Primary MD.  Get Medicines reviewed and adjusted. Please take all your medications with you for your next visit with your Primary MD  Please request your Primary MD to go over all hospital tests and procedure/radiological results at the follow up, please ask your Primary MD to get all Hospital records sent to his/her office.  If you experience worsening of your admission symptoms, develop shortness of breath, life threatening emergency, suicidal or homicidal thoughts you must seek medical attention immediately by calling 911 or calling your MD immediately  if symptoms less severe.  You must read complete instructions/literature along with all the possible adverse reactions/side effects for all the Medicines you take and that have been prescribed to you. Take any new Medicines after you have completely understood and accpet all the possible adverse reactions/side effects.   Do not drive or operate heavy machinery when taking Pain medications.   Do not take more than prescribed Pain, Sleep and Anxiety Medications  Special Instructions: If you have smoked or chewed Tobacco  in the last 2 yrs please stop smoking, stop any regular Alcohol  and or any Recreational drug use.  Wear Seat belts while driving.  Please note You were cared for by a hospitalist during your hospital stay. If you  have any questions about your discharge medications or the care you received while you were in the hospital after you are discharged, you can call the unit and asked to speak with the hospitalist on call if the hospitalist that took care of you is not available. Once you are discharged, your primary care physician will handle any further medical issues. Please note that NO REFILLS for any discharge medications will be  authorized once you are discharged, as it is imperative that you return to your primary care physician (or establish a relationship with a primary care physician if you do not have one) for your aftercare needs so that they can reassess your need for medications and monitor your lab values.  You can reach the hospitalist office at phone [phone] or fax [phone]   If you do not have a primary care physician, you can call [phone] for a physician referral.  Activity: As tolerated with Full fall precautions use walker/cane & assistance as needed    Diet: diabetic  Disposition Home

## 2018-05-22 NOTE — Discharge Summary (Addendum)
Physician Discharge Summary  [name] KPV:[id number] DOB: 1970/10/01 DOA: 05/21/2018  PCP: Cassandria Anger, MD  Admit date: 05/21/2018 Discharge date: 05/22/2018  Admitted From: home Disposition: home  Recommendations for Outpatient Follow-up:  1. Orthopedics (Dr. Sharol Given) in 1 week.  Discharge Condition: stable CODE STATUS: full Diet recommendation: regular diet  HPI: Per admitting MD, [name] is a 48 y.o. male with medical history significant of type 2 diabetes with neuropathy, hypertension, CKD stage III, previous history of diabetic foot infections and bilateral toe amputations. He presented to the ED 05/21/2018 with an episode of bleeding from his left foot, and ongoing pain for >6 months, getting worse. There is a callous on the bottom of both feet. According to the patient, he had amputation of the right foot toes about 2 years ago and then he had amputation of left foot toes about a year ago and has been doing well. Patient is having worsening pain of left medial foot, about 8 out of 10, more with ambulation and weightbearing. Denies any fever or chills. His left foot is slightly bigger than the right, however he thinks it is his usual self.  Hospital Course: Primary problem: Suspect osteomyelitis / cellulitis -patient was admitted to the hospital with small area of bleeding at the callus from the bottom of the left foot, with initial concerns for cellulitis and osteomyelitis. Xray of L foot was initially concerning for osteomyelitis at the bases of 1st-5th metatarsal bones, however a follow up MRI indicated no abscesses and changes more consistent with charcot arthropathy rather than osteomyelitis.  Dr. Sharol Given was consulted and followed patient while hospitalized, I have discussed case with Dr. Sharol Given over the phone, patient is afebrile, stable, he was initially admitted on broad-spectrum antibiotics but will be transitioned to doxycycline and will have outpatient  follow-up in orthopedic surgery's office within a week. Active problems: Hypertension -resume home medications on discharge Diabetic neuropathy -Continue gabapentin 344m tid. Type II diabetes -resume home medications on discharge CKD stage III -patient's creatinine 2018 was around 1.3, however this year appears to be around 1.6 and stable.  Suspect some progression of his CKD due to diabetes.  Patient also is taking ibuprofen on a daily basis, advised to avoid in the future.  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 ([facility]   Wound cellulitis   Discharge Instructions  Allergies as of 05/22/2018      Reactions   Bee Venom Swelling   SWELLING REACTION UNSPECIFIED    Penicillins Hives, Other (See Comments)   PATIENT HAS HAD A PCN REACTION WITH IMMEDIATE RASH, FACIAL/TONGUE/THROAT SWELLING, SOB, OR LIGHTHEADEDNESS WITH HYPOTENSION:  #  #  #  YES  #  #  #   Has patient had a PCN reaction causing severe rash involving mucus membranes or skin necrosis: No Has patient had a PCN reaction that required hospitalization: No Has patient had a PCN reaction occurring within the last 10 years: No Allergy Severity noted prior to 05/28/17      Medication List    STOP taking these medications   ibuprofen 400 MG tablet Commonly known as:  ADVIL,MOTRIN     TAKE these medications   aspirin EC 81 MG tablet Take 81 mg by mouth  daily.   atorvastatin 20 MG tablet Commonly known as:  LIPITOR Take 1 tablet (20 mg total) by mouth daily.   blood glucose meter kit and supplies Kit Dispense based on patient and insurance preference. Use up to four times daily as directed. (FOR ICD-9 250.00, 250.01).   citalopram 20 MG  tablet Commonly known as:  CELEXA Take 1 tablet (20 mg total) by mouth daily.   doxycycline 100 MG capsule Commonly known as:  VIBRAMYCIN Take 1 capsule (100 mg total) by mouth 2 (two) times daily.   ferrous sulfate 325 (65 FE) MG tablet Take 1 tablet (325 mg total) by mouth daily.   gabapentin 300 MG capsule Commonly known as:  NEURONTIN Take 1 capsule (300 mg total) by mouth 3 (three) times daily.   lisinopril 20 MG tablet Commonly known as:  PRINIVIL,ZESTRIL Take 1 tablet (20 mg total) by mouth daily.   metFORMIN 500 MG tablet Commonly known as:  GLUCOPHAGE Take 1 tablet (500 mg total) by mouth 2 (two) times daily with a meal.   metoprolol tartrate 25 MG tablet Commonly known as:  LOPRESSOR Take 1 tablet (25 mg total) by mouth 2 (two) times daily.      Follow-up Information    [name], MD Follow up in 1 week(s).   Specialty:  Orthopedic Surgery Contact information: [place] [address] [phone]           Consultations: Ortho  Mr Foot Left Wo Contrast  Result Date: 05/21/2018 CLINICAL DATA:  Chronic left foot pain and swelling in a diabetic patient. Wound on the medial aspect of the left foot. EXAM: MRI OF THE LEFT FOOT WITHOUT CONTRAST TECHNIQUE: Multiplanar, multisequence MR imaging of the left forefoot was performed. No intravenous contrast was administered. COMPARISON:  Plain films left foot 05/21/2018 and 08/20/2016. MRI left foot 08/21/2016. FINDINGS: Bones/Joint/Cartilage As seen on the prior study, the patient is status post amputation of the great toe. Charcot change is seen about the tarsometatarsal joints with subluxation of first through fourth metatarsals laterally, early bony destructive change about the tarsometatarsal joints, joint effusions at the first, second and third tarsometatarsal joints and marrow edema about the joints. Edema extends into the neck of the first metatarsal, mid diaphysis of the second  metatarsal and mid diaphysis of the third metatarsal. There is also some marrow edema in the proximal fourth and fifth metatarsals. Remote healed fracture of distal third metatarsal is identified. Mild edema is seen in the head of the second metatarsal. Sclerosis and thickening of the base of the proximal phalanx of the second toe is identified. Bone marrow signal is otherwise normal. Ligaments Intact. Muscles and Tendons Intermediate increased T2 signal in intrinsic musculature the foot is likely due to diabetic myopathy. Soft tissues There is subcutaneous edema about the foot. No abscess is identified. The patient's skin wound is not visualized. IMPRESSION: Charcot change at the articulations of the midfoot. Marrow edema in all of the metatarsals and throughout the cuneiforms and visualized cuboid is likely due to stress change related to neuropathic disease. Osteomyelitis is possible but the appearance is not typical for osteomyelitis. Negative for abscess. Status post amputation of the great toe. Remote healed third metatarsal fracture. Sclerosis and thickening of the base the proximal phalanx of the second toe likely due to remote osteomyelitis. Electronically Signed   By: Inge Rise M.D.   On: 05/21/2018 14:54   Dg Foot Complete Left  Result Date: 05/21/2018 CLINICAL DATA:  Left foot wound for 6 months. EXAM: LEFT FOOT - COMPLETE 3+ VIEW COMPARISON:  08/20/2016 FINDINGS: There is an abnormal appearance of the bases of the first through fifth metatarsal bones with subchondral osteolytic changes. Similar but less pronounced osteolytic changes are seen within the cuneiform bones and the cuboid bone at the tarsometatarsal junctions. There is a widening of the first Lisfranc joint, consistent with disruption of the associated ligaments. Mixed lytic and sclerotic changes and remodeling a seen of the proximal second phalanx. There has been a prior first toe amputation at the metatarsophalangeal joint. There  is diffuse soft tissue swelling of the foot. No soft tissue emphysema is seen. IMPRESSION: 1. Abnormal appearance of the bases of the first through fifth metatarsal bones with irregular subchondral osteolytic changes. Similar changes within the cuneiform bones and the cuboid bone at the tarsometatarsal junctions. Findings are consistent with osteomyelitis. 2. Widening of the first Lisfranc joint, consistent with joint disruption. 3. Diffuse soft tissue edema without soft tissue emphysema. Electronically Signed   By: Fidela Salisbury M.D.   On: 05/21/2018 10:03    Subjective: No chest pain, no shortness of breath, eager to go home  Discharge Exam: BP (!) 168/97 (BP Location: Left Arm)   Pulse 77   Temp 98.3 F (36.8 C) (Oral)   Resp 16   Ht 5' 4" (1.626 m)   Wt 89 kg   SpO2 100%   BMI 33.68 kg/m   General: Pt is alert, awake, not in acute distress Cardiovascular: RRR, S1/S2 +, no rubs, no gallops Respiratory: CTA bilaterally, no wheezing, no rhonchi Abdominal: Soft, NT, ND, bowel sounds + Extremities: L foot- tenderness, swelling of medial L foot. No erythema. DP pulse 2+ right foot, absent left foot. Baseline sensation is diminished, unchanged. Full ROM, strength in bilateral LEs. Thick callous on the plantar aspect of both feet. No ulceration.   The results of significant diagnostics from this hospitalization (including imaging, microbiology, ancillary and laboratory) are listed below for reference.     Microbiology: Recent Results (from the past 240 hour(s))  Culture, blood (routine x 2)     Status: None (Preliminary result)   Collection Time: 05/21/18  8:49 AM  Result Value Ref Range Status   Specimen Description BLOOD RIGHT ARM  Final   Special Requests   Final    BOTTLES DRAWN AEROBIC AND ANAEROBIC Blood Culture results may not be optimal due to an excessive volume of blood received in culture bottles Performed at [facility] [address], [place], [place]  [address]    Culture NO GROWTH < 12 HOURS  Final   Report Status PENDING  Incomplete  Culture, blood (routine x 2)     Status: None (Preliminary result)   Collection Time: 05/21/18  9:00 AM  Result Value Ref Range Status   Specimen Description BLOOD LEFT ANTECUBITAL  Final   Special Requests   Final    BOTTLES DRAWN AEROBIC AND ANAEROBIC Blood Culture results may not be optimal due to an inadequate volume of blood received in culture bottles Performed at [facility] [address] [place], [place] [address]    Culture NO GROWTH < 12 HOURS  Final   Report Status PENDING  Incomplete  MRSA PCR Screening     Status: None   Collection Time: 05/21/18  5:16 PM  Result Value Ref Range Status   MRSA by PCR NEGATIVE NEGATIVE Final    Comment:  The GeneXpert MRSA Assay (FDA approved for NASAL specimens only), is one component of a comprehensive MRSA colonization surveillance program. It is not intended to diagnose MRSA infection nor to guide or monitor treatment for MRSA infections. Performed at [facility], [place] [address], [place], [place] [address]      Labs: BNP (last 3 results) No results for input(s): BNP in the last 8760 hours. Basic Metabolic Panel: Recent Labs  Lab 05/21/18 0849 05/22/18 0240  NA 138 137  K 4.1 4.0  CL 106 107  CO2 26 22  GLUCOSE 188* 133*  BUN 18 26*  CREATININE 1.65* 1.68*  CALCIUM 9.2 8.4*   Liver Function Tests: No results for input(s): AST, ALT, ALKPHOS, BILITOT, PROT, ALBUMIN in the last 168 hours. No results for input(s): LIPASE, AMYLASE in the last 168 hours. No results for input(s): AMMONIA in the last 168 hours. CBC: Recent Labs  Lab 05/21/18 0849 05/22/18 0240  WBC 11.1* 11.4*  NEUTROABS 7.4  --   HGB 11.7* 9.9*  HCT 37.5* 33.3*  MCV 88.0 86.9  PLT 314 264   Cardiac Enzymes: No results for input(s): CKTOTAL, CKMB, CKMBINDEX, TROPONINI in the last 168 hours. BNP: Invalid input(s): POCBNP CBG: Recent Labs    Lab 05/21/18 1316 05/21/18 1617 05/21/18 2128 05/22/18 0723  GLUCAP 151* 216* 159* 125*   D-Dimer No results for input(s): DDIMER in the last 72 hours. Hgb A1c Recent Labs    05/21/18 1404  HGBA1C 7.9*   Lipid Profile No results for input(s): CHOL, HDL, LDLCALC, TRIG, CHOLHDL, LDLDIRECT in the last 72 hours. Thyroid function studies No results for input(s): TSH, T4TOTAL, T3FREE, THYROIDAB in the last 72 hours.  Invalid input(s): FREET3 Anemia work up No results for input(s): VITAMINB12, FOLATE, FERRITIN, TIBC, IRON, RETICCTPCT in the last 72 hours. Urinalysis    Component Value Date/Time   COLORURINE YELLOW 06/01/2017 1602   APPEARANCEUR CLEAR 06/01/2017 1602   LABSPEC >=1.030 (A) 06/01/2017 1602   PHURINE 5.0 06/01/2017 1602   GLUCOSEU NEGATIVE 06/01/2017 1602   HGBUR LARGE (A) 06/01/2017 1602   BILIRUBINUR NEGATIVE 06/01/2017 1602   KETONESUR NEGATIVE 06/01/2017 1602   PROTEINUR >=300 (A) 05/25/2017 1300   UROBILINOGEN 1.0 06/01/2017 1602   NITRITE NEGATIVE 06/01/2017 1602   LEUKOCYTESUR NEGATIVE 06/01/2017 1602   Sepsis Labs Invalid input(s): PROCALCITONIN,  WBC,  LACTICIDVEN  FURTHER DISCHARGE INSTRUCTIONS:   Get Medicines reviewed and adjusted: Please take all your medications with you for your next visit with your Primary MD   Laboratory/radiological data: Please request your Primary MD to go over all hospital tests and procedure/radiological results at the follow up, please ask your Primary MD to get all Hospital records sent to his/her office.   In some cases, they will be blood work, cultures and biopsy results pending at the time of your discharge. Please request that your primary care M.D. goes through all the records of your hospital data and follows up on these results.   Also Note the following: If you experience worsening of your admission symptoms, develop shortness of breath, life threatening emergency, suicidal or homicidal thoughts you must  seek medical attention immediately by calling 911 or calling your MD immediately  if symptoms less severe.   You must read complete instructions/literature along with all the possible adverse reactions/side effects for all the Medicines you take and that have been prescribed to you. Take any new Medicines after you have completely understood and accpet all the possible adverse reactions/side  effects.    Do not drive when taking Pain medications or sleeping medications (Benzodaizepines)   Do not take more than prescribed Pain, Sleep and Anxiety Medications. It is not advisable to combine anxiety,sleep and pain medications without talking with your primary care practitioner   Special Instructions: If you have smoked or chewed Tobacco  in the last 2 yrs please stop smoking, stop any regular Alcohol  and or any Recreational drug use.   Wear Seat belts while driving.   Please note: You were cared for by a hospitalist during your hospital stay. Once you are discharged, your primary care physician will handle any further medical issues. Please note that NO REFILLS for any discharge medications will be authorized once you are discharged, as it is imperative that you return to your primary care physician (or establish a relationship with a primary care physician if you do not have one) for your post hospital discharge needs so that they can reassess your need for medications and monitor your lab values.  Time coordinating discharge: 35 minutes  SIGNED:  Hazel Sams, PA-S  05/22/2018, 11:22 AM

## 2018-05-23 DIAGNOSIS — M79676 Pain in unspecified toe(s): Secondary | ICD-10-CM

## 2018-05-26 LAB — CULTURE, BLOOD (ROUTINE X 2)
Culture: NO GROWTH
Culture: NO GROWTH

## 2018-05-30 ENCOUNTER — Ambulatory Visit (INDEPENDENT_AMBULATORY_CARE_PROVIDER_SITE_OTHER): Payer: BLUE CROSS/BLUE SHIELD | Admitting: Orthopedic Surgery

## 2018-05-30 ENCOUNTER — Encounter (INDEPENDENT_AMBULATORY_CARE_PROVIDER_SITE_OTHER): Payer: Self-pay | Admitting: Orthopedic Surgery

## 2018-05-30 VITALS — Ht 64.0 in | Wt 196.0 lb

## 2018-05-30 DIAGNOSIS — E11610 Type 2 diabetes mellitus with diabetic neuropathic arthropathy: Secondary | ICD-10-CM

## 2018-05-30 DIAGNOSIS — M14672 Charcot's joint, left ankle and foot: Secondary | ICD-10-CM

## 2018-05-30 NOTE — Progress Notes (Addendum)
Office Visit Note   Patient: [name]           Date of Birth: 1971-01-09           MRN: [id number] Visit Date: 05/30/2018              Requested by: Cassandria Anger, MD [place], [place] [address] PCP: Cassandria Anger, MD  Chief Complaint  Patient presents with  . Left Foot - Follow-up      HPI: The patient is a 48 year old gentleman who presents for posthospitalization follow-up for diabetic insensate neuropathy with ulcerations of the left midfoot due to Charcot arthropathy with collapse. He was hospitalized from 05/21/2018 through 05/22/2018 and has remained out of work until yesterday 05/29/2018. He asked for FMLA to cover his days out while hospitalized and he may require days out for flares as well.  He was treated with intravenous antibiotic therapy and is on doxycycline 100 mg twice daily for a total of 10 days post discharge. He was followed by podiatry, Dr. March Rummage and did have inserts made by [facility] but has developed some worsening of Charcot collapse on the left foot and we discussed adding double upright bracing for the left side today.  The patient is concerned about his work and the need to stand for her and walk for long periods.  He is wearing new balance stiff soled shoes with inserts.  He does report pain over the left ankle and foot.  Assessment & Plan: Visit Diagnoses:  1. Charcot's joint, left ankle and foot   2. 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 Sharol Given.  The patient will likely continue to have intermittent flares and difficulties with his left Charcot foot and may require periods out of work of 1 to 2 days as a time secondary to this over the next 6 months.  He continues to be at risk for amputation should he develop further collapse and recurrent wounds.  He was given another prescription for [employer] to add double  upright bracing to the left shoe. We will also refer her to endocrinology as he reports a history of diabetes for 20 or more years now and has never been formally evaluated and now has worsening manifestations of his diabetes.   He will follow-up here in 4 weeks or sooner should he have difficulties in the interim.  Follow-Up Instructions: Return in about 4 weeks (around 06/27/2018).   Ortho Exam  Patient is alert, oriented, no adenopathy, well-dressed, normal affect, normal respiratory effort. The left plantar foot has a very large area of thick adherent callus approximately 12 x 7 cm and this was debrided sharply with a #10 blade knife per Dr. Sharol Given.  There is no underlying callus.  He has an excellent palpable pedal pulse.  He has a well-healed amputation site over the left great toe.  There is no current sign of cellulitis of the foot or ankle.  He does have moderate rocker-bottom collapse.  There is no open wound currently.  Imaging: No results found. No images are attached to the encounter.  Labs: Lab Results  Component Value Date   HGBA1C 7.9 (H) 05/21/2018   HGBA1C 11.3 (H) 06/01/2017   HGBA1C 9.8 (H) 08/20/2016   ESRSEDRATE 54 (H) 08/20/2016   ESRSEDRATE 8 07/24/2012   ESRSEDRATE 4 04/19/2008   CRP 2.2 (  H) 08/20/2016   REPTSTATUS 05/26/2018 FINAL 05/21/2018   GRAMSTAIN  07/24/2012    CYTOSPIN NO WBC SEEN NO ORGANISMS SEEN Gram Stain Report Called to,Read Back By and Verified With: [id number]   CULT NO GROWTH 5 DAYS 05/21/2018     Lab Results  Component Value Date   ALBUMIN 3.2 (L) 06/01/2017   ALBUMIN 2.8 (L) 05/25/2017   ALBUMIN 2.8 (L) 08/24/2016   PREALBUMIN 18.9 05/21/2018    Body mass index is 33.64 kg/m.  Orders:  No orders of the defined types were placed in this encounter.  No orders of the defined types were placed in this encounter.    Procedures: No procedures performed  Clinical Data: No additional findings.  ROS:  All  other systems negative, except as noted in the HPI. Review of Systems  Objective: Vital Signs: Ht 5\' 4"  (1.626 m)   Wt 196 lb (88.9 kg)   BMI 33.64 kg/m   Specialty Comments:  No specialty comments available.  PMFS History: Patient Active Problem List   Diagnosis Date Noted  . Wound cellulitis 05/22/2018  . Osteomyelitis of foot, acute ([place]) 05/21/2018  . 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 Diabetic ulcer of foot with fat layer exposed ([place]) 08/26/2017  . B12 deficiency 06/15/2017  . Vitamin D deficiency 06/03/2017  . Iron deficiency anemia 06/01/2017  . Diabetic foot infection ([place])   . Osteomyelitis ([place]) 05/25/2017  . Vitreous floaters of right eye 09/15/2016  . Non compliance w medication regimen 09/15/2016  . Depression 09/01/2016  . Foot ulcer due to secondary DM ([place]) 08/20/2016  . Hidradenitis suppurativa 10/13/2015  . Peripheral polyneuropathy 07/07/2015  . Coronary atherosclerosis of native coronary artery 08/17/2013  . Diabetic retinopathy ([place]) 02/02/2013  . Diabetic neuropathy ([place]) 12/01/2012  . Hypertension associated with diabetes ([place]) 08/03/2012  . Tobacco use disorder 08/03/2012  . Erectile dysfunction 08/03/2012  . Diabetes mellitus with neurological manifestations, uncontrolled ([place]) 05/26/2006   Past Medical History:  Diagnosis Date  . Diabetes mellitus type II, uncontrolled ([place])   . Hyperlipidemia   . Hypertension   . Other and unspecified angina pectoris   . Tobacco use     Family History  Problem Relation Age of Onset  . Cancer Mother   . Diabetes Father   . Hypertension Father     Past Surgical History:  Procedure Laterality Date  . AMPUTATION Left 08/22/2016   Procedure: GREAT TOE AMPUTATION;  Surgeon: [name], MD;  Location: [facility];  Service: Orthopedics;  Laterality: Left;  . AMPUTATION Right 05/28/2017   Procedure: AMPUTATION 1st & 3rd TOE;  Surgeon: [name], MD;  Location: [facility];   Service: Orthopedics;  Laterality: Right;  . EYE SURGERY    . LEFT HEART CATHETERIZATION WITH CORONARY ANGIOGRAM N/A 07/31/2013   Procedure: LEFT HEART CATHETERIZATION WITH CORONARY ANGIOGRAM;  Surgeon: Jettie Booze, MD;  Location: [facility] CATH LAB;  Service: Cardiovascular;  Laterality: N/A;  . spinal tap    . toe amputated     Social History   Occupational History  . Occupation: Leisure centre manager: [employer]  Tobacco Use  . Smoking status: Current Every Day Smoker    Packs/day: 1.00    Years: 20.00    Pack years: 20.00    Types: Cigarettes  . Smokeless tobacco: Never Used  . Tobacco comment: wants patches   Substance and Sexual Activity  . Alcohol use: No  Comment: occ  . Drug use: No  . Sexual activity: Not Currently

## 2018-05-30 NOTE — Addendum Note (Signed)
Addended by: Milas Gain on: 05/30/2018 01:24 PM   Modules accepted: Orders

## 2018-06-02 ENCOUNTER — Telehealth: Payer: Self-pay | Admitting: Podiatry

## 2018-06-02 NOTE — Telephone Encounter (Signed)
I would like a copy of my medical records.

## 2018-06-05 ENCOUNTER — Encounter (HOSPITAL_COMMUNITY): Payer: Self-pay

## 2018-06-05 ENCOUNTER — Emergency Department (HOSPITAL_COMMUNITY)
Admission: EM | Admit: 2018-06-05 | Discharge: 2018-06-05 | Disposition: A | Discharge status: Home / Self Care | Payer: BLUE CROSS/BLUE SHIELD | Attending: Emergency Medicine | Admitting: Emergency Medicine

## 2018-06-05 ENCOUNTER — Other Ambulatory Visit: Payer: Self-pay

## 2018-06-05 DIAGNOSIS — R51 Headache: Secondary | ICD-10-CM | POA: Diagnosis present

## 2018-06-05 DIAGNOSIS — Z5321 Procedure and treatment not carried out due to patient leaving prior to being seen by health care provider: Secondary | ICD-10-CM | POA: Diagnosis not present

## 2018-06-05 LAB — COMPREHENSIVE METABOLIC PANEL
ALT: 14 U/L (ref 0–44)
AST: 15 U/L (ref 15–41)
Albumin: 3.6 g/dL (ref 3.5–5.0)
Alkaline Phosphatase: 92 U/L (ref 38–126)
Anion gap: 5 (ref 5–15)
BUN: 22 mg/dL — ABNORMAL HIGH (ref 6–20)
CHLORIDE: 107 mmol/L (ref 98–111)
CO2: 27 mmol/L (ref 22–32)
Calcium: 9.3 mg/dL (ref 8.9–10.3)
Creatinine, Ser: 1.49 mg/dL — ABNORMAL HIGH (ref 0.61–1.24)
GFR calc Af Amer: >60 mL/min (ref >60)
GFR calc non Af Amer: 55 mL/min — ABNORMAL LOW (ref >60)
Glucose, Bld: 173 mg/dL — ABNORMAL HIGH (ref 70–99)
Potassium: 4.5 mmol/L (ref 3.5–5.1)
SODIUM: 139 mmol/L (ref 135–145)
Total Bilirubin: 0.3 mg/dL (ref 0.3–1.2)
Total Protein: 7.9 g/dL (ref 6.5–8.1)

## 2018-06-05 LAB — CBC
HEMATOCRIT: 40.8 % (ref 39.0–52.0)
Hemoglobin: 12.2 g/dL — ABNORMAL LOW (ref 13.0–17.0)
MCH: 27.2 pg (ref 26.0–34.0)
MCHC: 29.9 g/dL — ABNORMAL LOW (ref 30.0–36.0)
MCV: 90.9 fL (ref 80.0–100.0)
Platelets: 272 10*3/uL (ref 150–400)
RBC: 4.49 MIL/uL (ref 4.22–5.81)
RDW: 14.6 % (ref 11.5–15.5)
WBC: 10.7 10*3/uL — ABNORMAL HIGH (ref 4.0–10.5)
nRBC: 0 % (ref 0.0–0.2)

## 2018-06-05 LAB — LIPASE, BLOOD: Lipase: 36 U/L (ref 11–51)

## 2018-06-05 MED ORDER — SODIUM CHLORIDE 0.9% FLUSH: 3.0000 mL | Freq: Once | INTRAVENOUS | Status: DC

## 2018-06-05 NOTE — ED Triage Notes (Signed)
Patient c/o left abdominal pain and lower abdominal pain x 3 days. Patient c/o constipation x 3 days. Patient denies passing gas.  Patient also c/o constant headache x 2 weeks. Patient denies any blurred vision or sensitivity to light. Patient was hypertensive in triage (172/106). Patient states that he did not take his BP meds today.

## 2018-06-05 NOTE — ED Notes (Signed)
Pt called to be roomed with no response.

## 2018-06-05 NOTE — ED Notes (Signed)
Called for patient in lobby.

## 2018-06-26 ENCOUNTER — Telehealth (INDEPENDENT_AMBULATORY_CARE_PROVIDER_SITE_OTHER): Payer: Self-pay | Admitting: *Deleted

## 2018-06-26 NOTE — Telephone Encounter (Signed)
Called pt and they answered no to all covid-19 pre screening questions.  

## 2018-06-27 ENCOUNTER — Encounter (INDEPENDENT_AMBULATORY_CARE_PROVIDER_SITE_OTHER): Payer: Self-pay | Admitting: Orthopedic Surgery

## 2018-06-27 ENCOUNTER — Ambulatory Visit (INDEPENDENT_AMBULATORY_CARE_PROVIDER_SITE_OTHER): Payer: BLUE CROSS/BLUE SHIELD | Admitting: Orthopedic Surgery

## 2018-06-27 ENCOUNTER — Other Ambulatory Visit: Payer: Self-pay

## 2018-06-27 VITALS — Ht 64.0 in | Wt 189.0 lb

## 2018-06-27 DIAGNOSIS — E11610 Type 2 diabetes mellitus with diabetic neuropathic arthropathy: Secondary | ICD-10-CM

## 2018-06-27 DIAGNOSIS — S98111A Complete traumatic amputation of right great toe, initial encounter: Secondary | ICD-10-CM

## 2018-06-27 DIAGNOSIS — S98112A Complete traumatic amputation of left great toe, initial encounter: Secondary | ICD-10-CM

## 2018-06-27 DIAGNOSIS — Z89412 Acquired absence of left great toe: Secondary | ICD-10-CM | POA: Diagnosis not present

## 2018-06-27 NOTE — Progress Notes (Signed)
Office Visit Note   Patient: [name]           Date of Birth: [birth year]           MRN: [id number] Visit Date: 06/27/2018              Requested by: Cassandria Anger, MD [place],  [place] [address] PCP: Cassandria Anger, MD  Chief Complaint  Patient presents with   Left Foot - Follow-up      HPI: Patient is a 48 year old gentleman with diabetic insensate neuropathy Charcot collapse on the left foot with amputation left great toe as well as amputation of the right first and third toes.  Patient complains of pain on the plantar aspect of both feet.  Patient states that he works standing on his feet over 8 hours a day.  Assessment & Plan: Visit Diagnoses:  1. 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 ([place])   2. Amputated great toe, right ([place])   3. Amputated great toe, left ([place])     Plan: Patient was given a note to be out of work for 2 weeks and follow-up in 2 weeks.  I have concerned with the large ulcer on the plantar aspect of the left foot.  Follow-Up Instructions: Return in about 2 weeks (around 07/11/2018).   Ortho Exam  Patient is alert, oriented, no adenopathy, well-dressed, normal affect, normal respiratory effort. Examination patient has good pulses bilaterally.  He has a massive ulcerative callus on the plantar aspect of the rocker-bottom deformity of the left foot.  The left great toe amputation is well-healed.  After informed consent a 10 blade knife was used to debride the skin and soft tissue back to healthy viable tissue.  Silver nitrate was used for hemostasis a Band-Aid was applied the left foot ulcer measures 7 x 5 cm after debridement and is 5 mm deep.  Examination the right foot patient has a well-healed first and third toe amputation.  There are 2 ulcers laterally these were also debrided of skin and soft tissue with a 10 blade knife these each measured 3 cm in diameter there is no exposed bone or tendon in all 3  wounds.  There is no cellulitis no drainage.  Imaging: No results found. No images are attached to the encounter.  Labs: Lab Results  Component Value Date   HGBA1C 7.9 (H) 05/21/2018   HGBA1C 11.3 (H) 06/01/2017   HGBA1C 9.8 (H) 08/20/2016   ESRSEDRATE 54 (H) 08/20/2016   ESRSEDRATE 8 07/24/2012   ESRSEDRATE 4 04/19/2008   CRP 2.2 (H) 08/20/2016   REPTSTATUS 05/26/2018 FINAL 05/21/2018   GRAMSTAIN  07/24/2012    CYTOSPIN NO WBC SEEN NO ORGANISMS SEEN Gram Stain Report Called to,Read Back By and Verified With: R.[id number]/[email or web address]   CULT NO GROWTH 5 DAYS 05/21/2018     Lab Results  Component Value Date   ALBUMIN 3.6 06/05/2018   ALBUMIN 3.2 (L) 06/01/2017   ALBUMIN 2.8 (L) 05/25/2017   PREALBUMIN 18.9 05/21/2018    Body mass index is 32.44 kg/m.  Orders:  No orders of the defined types were placed in this encounter.  No orders of the defined types were placed in this encounter.    Procedures: No procedures performed  Clinical Data: No additional findings.  ROS:  All other systems negative, except as noted in the HPI. Review of Systems  Objective: Vital Signs: Ht 5\' 4"  (1.626 m)    Wt 189 lb (85.7  kg)    BMI 32.44 kg/m   Specialty Comments:  No specialty comments available.  PMFS History: Patient Active Problem List   Diagnosis Date Noted   Wound cellulitis 05/22/2018   Osteomyelitis of foot, acute ([place]) 05/21/2018   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 ([place])    Diabetic ulcer of foot with fat layer exposed ([place]) 08/26/2017   B12 deficiency 06/15/2017   Vitamin D deficiency 06/03/2017   Iron deficiency anemia 06/01/2017   Diabetic foot infection ([place])    Osteomyelitis ([place]) 05/25/2017   Vitreous floaters of right eye 09/15/2016   Non compliance w medication regimen 09/15/2016   Depression 09/01/2016   Foot ulcer due to secondary DM ([place]) 08/20/2016   Hidradenitis suppurativa 10/13/2015    Peripheral polyneuropathy 07/07/2015   Coronary atherosclerosis of native coronary artery 08/17/2013   Diabetic retinopathy ([place]) 02/02/2013   Diabetic neuropathy ([place]) 12/01/2012   Hypertension associated with diabetes ([place]) 08/03/2012   Tobacco use disorder 08/03/2012   Erectile dysfunction 08/03/2012   Diabetes mellitus with neurological manifestations, uncontrolled ([place]) 05/26/2006   Past Medical History:  Diagnosis Date   Diabetes mellitus type II, uncontrolled ([place])    Hyperlipidemia    Hypertension    Other and unspecified angina pectoris    Tobacco use     Family History  Problem Relation Age of Onset   Cancer Mother    Diabetes Father    Hypertension Father     Past Surgical History:  Procedure Laterality Date   AMPUTATION Left 08/22/2016   Procedure: GREAT TOE AMPUTATION;  Surgeon: [name], MD;  Location: [facility];  Service: Orthopedics;  Laterality: Left;   AMPUTATION Right 05/28/2017   Procedure: AMPUTATION 1st & 3rd TOE;  Surgeon: [name], MD;  Location: [facility];  Service: Orthopedics;  Laterality: Right;   EYE SURGERY     LEFT HEART CATHETERIZATION WITH CORONARY ANGIOGRAM N/A 07/31/2013   Procedure: LEFT HEART CATHETERIZATION WITH CORONARY ANGIOGRAM;  Surgeon: Jettie Booze, MD;  Location[facility] LAB;  Service: Cardiovascular;  Laterality: N/A;   spinal tap     toe amputated     Social History   Occupational History   Occupation: Leisure centre manager: [employer]  Tobacco Use   Smoking status: Current Every Day Smoker    Packs/day: 1.00    Years: 20.00    Pack years: 20.00    Types: Cigarettes   Smokeless tobacco: Never Used   Tobacco comment: wants patches   Substance and Sexual Activity   Alcohol use: Not Currently    Comment: occ   Drug use: No   Sexual activity: Not Currently

## 2018-06-30 ENCOUNTER — Encounter: Payer: Self-pay | Admitting: Internal Medicine

## 2018-06-30 ENCOUNTER — Ambulatory Visit: Payer: BLUE CROSS/BLUE SHIELD | Admitting: Podiatry

## 2018-07-11 ENCOUNTER — Ambulatory Visit (INDEPENDENT_AMBULATORY_CARE_PROVIDER_SITE_OTHER): Payer: BLUE CROSS/BLUE SHIELD | Admitting: Orthopedic Surgery

## 2018-07-11 ENCOUNTER — Encounter (INDEPENDENT_AMBULATORY_CARE_PROVIDER_SITE_OTHER): Payer: Self-pay | Admitting: Orthopedic Surgery

## 2018-07-11 ENCOUNTER — Other Ambulatory Visit: Payer: Self-pay

## 2018-07-11 VITALS — Ht 64.0 in | Wt 189.0 lb

## 2018-07-11 DIAGNOSIS — S98112A Complete traumatic amputation of left great toe, initial encounter: Secondary | ICD-10-CM

## 2018-07-11 DIAGNOSIS — Z89412 Acquired absence of left great toe: Secondary | ICD-10-CM

## 2018-07-11 DIAGNOSIS — E11610 Type 2 diabetes mellitus with diabetic neuropathic arthropathy: Secondary | ICD-10-CM

## 2018-07-11 DIAGNOSIS — L97521 Non-pressure chronic ulcer of other part of left foot limited to breakdown of skin: Secondary | ICD-10-CM

## 2018-07-11 NOTE — Progress Notes (Signed)
Office Visit Note   Patient: [name]           Date of Birth: 1970-07-10           MRN: [id number] Visit Date: 07/11/2018              Requested by: Cassandria Anger, MD [place], [place] [address] PCP: Cassandria Anger, MD  Chief Complaint  Patient presents with   Left Foot - Follow-up      HPI: Patient is a 48 year old gentleman with diabetic insensate neuropathy Charcot collapse left foot status post left great toe amputation with double upright braces custom orthotics and extra-depth shoe.  Patient was states that he has recurrent ulcerations on the plantar aspect of his left foot.  He states he does want to return to work but has difficulty standing.  Assessment & Plan: Visit Diagnoses:  1. 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 ([place])   2. Amputated great toe, left ([place])   3. Non-pressure chronic ulcer of other part of left foot limited to breakdown of skin [facility])     Plan: Patient was given a note that he may return to part-time work he needs to minimize standing.  Discussed that with patient's Charcot arthropathy collapse of the foot with ulceration and toe amputation patient's best option is to apply for disability he should not be working long-term on his feet.  Patient is at risk of amputation.  Follow-Up Instructions: Return in about 3 months (around 10/10/2018).   Ortho Exam  Patient is alert, oriented, no adenopathy, well-dressed, normal affect, normal respiratory effort. Examination patient has a palpable pulse he has a Charcot rocker-bottom deformity of the left foot with a large ulcer.  The great toe amputation is well-healed he is currently wearing extra-depth shoe with double upright braces and an orthotic.  After informed consent a 10 blade knife was used to debride the skin and soft tissue back to bleeding viable granulation tissue this was touched with silver nitrate the ulcerative area is 6 cm in diameter and  3 mm deep.  There is no abscess no ulceration no signs of infection no signs of active Charcot arthropathy.  Imaging: No results found. No images are attached to the encounter.  Labs: Lab Results  Component Value Date   HGBA1C 7.9 (H) 05/21/2018   HGBA1C 11.3 (H) 06/01/2017   HGBA1C 9.8 (H) 08/20/2016   ESRSEDRATE 54 (H) 08/20/2016   ESRSEDRATE 8 07/24/2012   ESRSEDRATE 4 04/19/2008   CRP 2.2 (H) 08/20/2016   REPTSTATUS 05/26/2018 FINAL 05/21/2018   GRAMSTAIN  07/24/2012    CYTOSPIN NO WBC SEEN NO ORGANISMS SEEN Gram Stain Report Called to,Read Back By and Verified With: R.[email or web address]/[id number]/[email or web address]   CULT NO GROWTH 5 DAYS 05/21/2018     Lab Results  Component Value Date   ALBUMIN 3.6 06/05/2018   ALBUMIN 3.2 (L) 06/01/2017   ALBUMIN 2.8 (L) 05/25/2017   PREALBUMIN 18.9 05/21/2018    Body mass index is 32.44 kg/m.  Orders:  No orders of the defined types were placed in this encounter.  No orders of the defined types were placed in this encounter.    Procedures: No procedures performed  Clinical Data: No additional findings.  ROS:  All other systems negative, except as noted in the HPI. Review of Systems  Objective: Vital Signs: Ht 5\' 4"  (1.626 m)    Wt 189 lb (85.7 kg)    BMI 32.44 kg/m  Specialty Comments:  No specialty comments available.  PMFS History: Patient Active Problem List   Diagnosis Date Noted   Wound cellulitis 05/22/2018   Osteomyelitis of foot, acute ([place]) 05/21/2018   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 ([place])    Diabetic ulcer of foot with fat layer exposed ([place]) 08/26/2017   B12 deficiency 06/15/2017   Vitamin D deficiency 06/03/2017   Iron deficiency anemia 06/01/2017   Diabetic foot infection ([place])    Osteomyelitis ([place]) 05/25/2017   Vitreous floaters of right eye 09/15/2016   Non compliance w medication regimen 09/15/2016   Depression 09/01/2016   Foot ulcer due to secondary DM  ([place]) 08/20/2016   Hidradenitis suppurativa 10/13/2015   Peripheral polyneuropathy 07/07/2015   Coronary atherosclerosis of native coronary artery 08/17/2013   Diabetic retinopathy ([place]) 02/02/2013   Diabetic neuropathy ([place]) 12/01/2012   Hypertension associated with diabetes ([place]) 08/03/2012   Tobacco use disorder 08/03/2012   Erectile dysfunction 08/03/2012   Diabetes mellitus with neurological manifestations, uncontrolled ([place]) 05/26/2006   Past Medical History:  Diagnosis Date   Diabetes mellitus type II, uncontrolled ([place])    Hyperlipidemia    Hypertension    Other and unspecified angina pectoris    Tobacco use     Family History  Problem Relation Age of Onset   Cancer Mother    Diabetes Father    Hypertension Father     Past Surgical History:  Procedure Laterality Date   AMPUTATION Left 08/22/2016   Procedure: GREAT TOE AMPUTATION;  Surgeon: [name], MD;  Location: [facility];  Service: Orthopedics;  Laterality: Left;   AMPUTATION Right 05/28/2017   Procedure: AMPUTATION 1st & 3rd TOE;  Surgeon: [name], MD;  Location: [facility];  Service: Orthopedics;  Laterality: Right;   EYE SURGERY     LEFT HEART CATHETERIZATION WITH CORONARY ANGIOGRAM N/A 07/31/2013   Procedure: LEFT HEART CATHETERIZATION WITH CORONARY ANGIOGRAM;  Surgeon: Jettie Booze, MD;  Location: [facility] CATH LAB;  Service: Cardiovascular;  Laterality: N/A;   spinal tap     toe amputated     Social History   Occupational History   Occupation: Leisure centre manager: [employer]  Tobacco Use   Smoking status: Current Every Day Smoker    Packs/day: 1.00    Years: 20.00    Pack years: 20.00    Types: Cigarettes   Smokeless tobacco: Never Used   Tobacco comment: wants patches   Substance and Sexual Activity   Alcohol use: Not Currently    Comment: occ   Drug use: No   Sexual activity: Not Currently

## 2018-07-14 ENCOUNTER — Ambulatory Visit (INDEPENDENT_AMBULATORY_CARE_PROVIDER_SITE_OTHER): Payer: BLUE CROSS/BLUE SHIELD | Admitting: Internal Medicine

## 2018-07-14 ENCOUNTER — Encounter: Payer: Self-pay | Admitting: Internal Medicine

## 2018-07-14 ENCOUNTER — Ambulatory Visit: Payer: BLUE CROSS/BLUE SHIELD | Admitting: Podiatry

## 2018-07-14 ENCOUNTER — Other Ambulatory Visit: Payer: Self-pay

## 2018-07-14 DIAGNOSIS — L089 Local infection of the skin and subcutaneous tissue, unspecified: Secondary | ICD-10-CM | POA: Insufficient documentation

## 2018-07-14 DIAGNOSIS — E1142 Type 2 diabetes mellitus with diabetic polyneuropathy: Secondary | ICD-10-CM

## 2018-07-14 DIAGNOSIS — E11628 Type 2 diabetes mellitus with other skin complications: Secondary | ICD-10-CM | POA: Insufficient documentation

## 2018-07-14 DIAGNOSIS — E113593 Type 2 diabetes mellitus with proliferative diabetic retinopathy without macular edema, bilateral: Secondary | ICD-10-CM | POA: Diagnosis not present

## 2018-07-14 DIAGNOSIS — E1165 Type 2 diabetes mellitus with hyperglycemia: Secondary | ICD-10-CM

## 2018-07-14 NOTE — Progress Notes (Signed)
Virtual Visit via Video Note  I connected with [name] on 07/14/18 at  2:00 PM EDT by a video enabled telemedicine application and verified that I am speaking with the correct person using two identifiers.   I discussed the limitations of evaluation and management by telemedicine and the availability of in person appointments. The patient expressed understanding and agreed to proceed.  -Location of the patient : Home  -Location of the provider : Office  -The names of all persons participating in the telemedicine service : [place]     Name: [name]  MRN/ DOB: [id number], 04-May-1970   Age/ Sex: 48 y.o., male    PCP: Plotnikov, Evie Lacks, MD   Reason for Endocrinology Evaluation: Type 2 Diabetes Mellitus     Date of Initial Endocrinology Visit: 07/14/2018     PATIENT IDENTIFIER: [name] is a 48 y.o. male with a past medical history of T2DM, and HTN, Hx  toe amputations Bilaterally .The patient presented for initial endocrinology clinic visit on 07/14/2018 for consultative assistance with his diabetes management.    HPI: Mr. Dutton was    Diagnosed with T2DM 20 yrs ago Prior Medications tried/Intolerance: Was on insulin 6 months ago, Glipizide  Currently checking blood sugars 2x / day,  before breakfast and bedtime  Hypoglycemia episodes : 0           Hemoglobin A1c has ranged from 7.0% in 2014, peaking at 11.3% in 2019. Patient required assistance for hypoglycemia: no Patient has required hospitalization within the last 1 year from hyper or hypoglycemia: no  In terms of diet, the patient drinks sweet tea. Eats 2 meals a day and snacks between meals.    HOME DIABETES REGIMEN: Metformin 500 mg BID   Statin: yes ACE-I/ARB: yes Prior Diabetic Education: no   GLUCOSE LOG: Date  Lunch  Bedtime  07/14/18 208 160  4/16 200 140  4/14 160 140    DIABETIC COMPLICATIONS: Microvascular complications:   Neuropathy, charcot foot,  retinopathy  S/P laser   Denies: CKD,  Last eye exam: Completed 2019  Macrovascular complications:    Denies: CAD, PVD, CVA   PAST HISTORY: Past Medical History:  Past Medical History:  Diagnosis Date  . Diabetes mellitus type II, uncontrolled ([place])   . Hyperlipidemia   . Hypertension   . Other and unspecified angina pectoris   . Tobacco use     Past Surgical History:  Past Surgical History:  Procedure Laterality Date  . AMPUTATION Left 08/22/2016   Procedure: GREAT TOE AMPUTATION;  Surgeon: [name], MD;  Location: [facility];  Service: Orthopedics;  Laterality: Left;  . AMPUTATION Right 05/28/2017   Procedure: AMPUTATION 1st & 3rd TOE;  Surgeon: [name], MD;  Location: [facility];  Service: Orthopedics;  Laterality: Right;  . EYE SURGERY    . LEFT HEART CATHETERIZATION WITH CORONARY ANGIOGRAM N/A 07/31/2013   Procedure: LEFT HEART CATHETERIZATION WITH CORONARY ANGIOGRAM;  Surgeon: Jettie Booze, MD;  Location: [facility] CATH LAB;  Service: Cardiovascular;  Laterality: N/A;  . spinal tap    . toe amputated        Social History:  reports that he has been smoking cigarettes. He has a 20.00 pack-year smoking history. He has never used smokeless tobacco. He reports previous alcohol use. He reports that he does not use drugs. Family History:  Family History  Problem Relation Age of Onset  . Cancer Mother   . Diabetes  Father   . Hypertension Father       HOME MEDICATIONS: Allergies as of 07/14/2018      Reactions   Bee Venom Swelling   SWELLING REACTION UNSPECIFIED    Penicillins Hives, Other (See Comments)   PATIENT HAS HAD A PCN REACTION WITH IMMEDIATE RASH, FACIAL/TONGUE/THROAT SWELLING, SOB, OR LIGHTHEADEDNESS WITH HYPOTENSION:  #  #  #  YES  #  #  #   Has patient had a PCN reaction causing severe rash involving mucus membranes or skin necrosis: No Has patient had a PCN reaction that required hospitalization: No Has patient had a PCN reaction occurring within the  last 10 years: No Allergy Severity noted prior to 05/28/17      Medication List       Accurate as of July 14, 2018 12:25 PM. Always use your most recent med list.        aspirin EC 81 MG tablet Take 81 mg by mouth daily.   atorvastatin 20 MG tablet Commonly known as:  LIPITOR Take 1 tablet (20 mg total) by mouth daily.   blood glucose meter kit and supplies Kit Dispense based on patient and insurance preference. Use up to four times daily as directed. (FOR ICD-9 250.00, 250.01).   citalopram 20 MG tablet Commonly known as:  CeleXA Take 1 tablet (20 mg total) by mouth daily.   doxycycline 100 MG capsule Commonly known as:  VIBRAMYCIN Take 1 capsule (100 mg total) by mouth 2 (two) times daily.   ferrous sulfate 325 (65 FE) MG tablet Take 1 tablet (325 mg total) by mouth daily.   gabapentin 300 MG capsule Commonly known as:  Neurontin Take 1 capsule (300 mg total) by mouth 3 (three) times daily.   lisinopril 20 MG tablet Commonly known as:  ZESTRIL Take 1 tablet (20 mg total) by mouth daily.   metFORMIN 500 MG tablet Commonly known as:  GLUCOPHAGE Take 1 tablet (500 mg total) by mouth 2 (two) times daily with a meal.   metoprolol tartrate 25 MG tablet Commonly known as:  LOPRESSOR Take 1 tablet (25 mg total) by mouth 2 (two) times daily.        ALLERGIES: Allergies  Allergen Reactions  . Bee Venom Swelling    SWELLING REACTION UNSPECIFIED   . Penicillins Hives and Other (See Comments)     REVIEW OF SYSTEMS: A comprehensive ROS was conducted with the patient and is negative except as per HPI and below:  Review of Systems  Constitutional: Negative for fever and weight loss.  HENT: Negative for congestion and sore throat.   Eyes: Negative for blurred vision and pain.  Respiratory: Negative for cough and shortness of breath.   Cardiovascular: Negative for chest pain and palpitations.  Gastrointestinal: Negative for diarrhea and nausea.  Genitourinary:  Negative for frequency.  Neurological: Positive for tingling. Negative for tremors.  Endo/Heme/Allergies: Negative for polydipsia.  Psychiatric/Behavioral: Negative for depression and memory loss.        DATA REVIEWED:  Lab Results  Component Value Date   HGBA1C 7.9 (H) 05/21/2018   HGBA1C 11.3 (H) 06/01/2017   HGBA1C 9.8 (H) 08/20/2016   Lab Results  Component Value Date   MICROALBUR 92.0 (H) 06/01/2017   LDLCALC 43 06/01/2017   CREATININE 1.49 (H) 06/05/2018   Lab Results  Component Value Date   MICRALBCREAT 50.3 (H) 06/01/2017    Lab Results  Component Value Date   CHOL 93 06/01/2017   HDL 39.70 06/01/2017  LDLCALC 43 06/01/2017   TRIG 51.0 06/01/2017   CHOLHDL 2 06/01/2017        ASSESSMENT / PLAN / RECOMMENDATIONS:   1) Type 2 Diabetes Mellitus, Historically Poorly controlled, With Neuropathic complications, retinopathy and B/L toe amputations - Most recent A1c of 7.9 %. Goal A1c <  7.0 %.   Plan: GENERAL: I have discussed with the patient the pathophysiology of diabetes. We went over the natural progression of the disease. We talked about both insulin resistance and insulin deficiency. We stressed the importance of lifestyle changes including diet and exercise. I explained the complications associated with diabetes including retinopathy, nephropathy, neuropathy as well as increased risk of cardiovascular disease. We went over the benefit seen with glycemic control.    I explained to the patient that diabetic patients are at higher than normal risk for amputations. The patient was informed that diabetes is the number one cause of non-traumatic amputations in [place]. Unfortunately the patient already has a lot of the complications, but we discussed that controlling his BG's at this time will slow the progression of microvascular damage.   We discussed the importance of avoiding sugar-sweetened beverages and avoiding snacks.   We will start by titrating his  metformin dose up and will see him in 4 weeks to assess the need for another add on therapy.   MEDICATIONS:  Increase Metformin to 2 tablets with Breakfast and 2 with Supper - titration provided to increase dose to 2 tabs with supper and 1 tab with Breakfast for one week, followed by 2 tabs BID   EDUCATION / INSTRUCTIONS:  BG monitoring instructions: Patient is instructed to check his blood sugars 2 times a day, fasting and bedtime.  Call [facility] if: BG persistently < 70 or > 300. . I reviewed the Rule of 15 for the treatment of hypoglycemia in detail with the patient. Literature supplied.   2) Diabetic complications:   Eye: Does  have known diabetic retinopathy.   Neuro/ Feet: Does  have known diabetic peripheral neuropathy.  Renal: Patient does not have known baseline CKD. He is  on an ACEI/ARB at present.   3) Lipids: Patient is on a statin.    4) Hypertension: Historically this has been above goal of < 140/90 mmHg.     I discussed the assessment and treatment plan with the patient. The patient was provided an opportunity to ask questions and all were answered. The patient agreed with the plan and demonstrated an understanding of the instructions.   The patient was advised to call back or seek an in-person evaluation if the symptoms worsen or if the condition fails to improve as anticipated.  F/u in 4 weeks    Signed electronically by: Mack Guise, MD  [facility] Endocrinology  [facility] [address] [place], [place] [address] Phone: [phone] FAX: [phone]   CC: Cassandria Anger, MD [place] [address] Phone: [phone] Fax: [phone]   Return to Endocrinology clinic as below: Future Appointments  Date Time Provider [place]  07/14/2018  2:00 PM Brenly Trawick, Melanie Crazier, MD [facility] None  10/10/2018  8:15 AM [place], Neta Mends, PA-C [facility] None

## 2018-07-18 ENCOUNTER — Other Ambulatory Visit: Payer: Self-pay | Admitting: Internal Medicine

## 2018-07-18 NOTE — Telephone Encounter (Signed)
Requested medication (s) are due for refill today: yes  Requested medication (s) are on the active medication list: yes  Last refill:   06/01/17  Expired RX's  Future visit scheduled: No  Notes to clinic:  Needs appointment. Expired RX's    Requested Prescriptions  Pending Prescriptions Disp Refills   gabapentin (NEURONTIN) 300 MG capsule 90 capsule 5    Sig: Take 1 capsule (300 mg total) by mouth 3 (three) times daily.     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 associated with type 2 diabetes mellitus, unspecified laterality ([place])   [facility] Plotnikov, Evie Lacks, MD   1 year ago Hypertension associated with diabetes ([place])   [place] Plotnikov, Evie Lacks, MD   1 year ago Foot ulcer due to secondary DM [facility])   [place], Evie Lacks, MD   4 years ago Coronary atherosclerosis of native coronary artery   Therapist, music at [employer], [place], DO      Future Appointments            In 2 months Rayburn, Neta Mends, PA-C [place]          metFORMIN (GLUCOPHAGE) 500 MG tablet 60 tablet 11    Sig: Take 1 tablet (500 mg total) by mouth 2 (two) times daily with a meal.     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 associated with type 2 diabetes mellitus, unspecified laterality ([place])   [facility], Evie Lacks, MD   1 year ago Hypertension associated with diabetes ([place])   [facility], Evie Lacks, MD   1 year ago Foot ulcer due to secondary DM [facility])   [facility], Evie Lacks, MD   4 years ago Coronary atherosclerosis of native coronary artery   Therapist, music at [employer], [place], DO      Future Appointments            In 2 months Rayburn, Neta Mends, PA-C [place] - HBA1C is between 0 and 7.9 and within 180 days    Hgb A1c MFr Bld  Date Value Ref Range Status  05/21/2018 7.9 (H) 4.8 - 5.6 %  Final    Comment:    (NOTE) Pre diabetes:          5.7%-6.4% Diabetes:              >6.4% Glycemic control for   <7.0% adults with diabetes          Passed - eGFR in normal range and within 360 days    GFR calc Af Amer  Date Value Ref Range Status  06/05/2018 >60 >60 mL/min Final   GFR calc non Af Amer  Date Value Ref Range Status  06/05/2018 55 (L) >60 mL/min Final   GFR  Date Value Ref Range Status  06/01/2017 96.04 >60.00 mL/min Final        lisinopril (ZESTRIL) 20 MG tablet 30 tablet 11    Sig: Take 1 tablet (20 mg total) by mouth daily.     Cardiovascular:  ACE Inhibitors Failed - 07/18/2018 10:42 AM      Failed - Cr in normal range and within 180 days    Creatinine, Ser  Date Value Ref Range Status  06/05/2018 1.49 (H) 0.61 - 1.24 mg/dL Final         Failed - Last BP in normal range    BP Readings from Last  1 Encounters:  06/05/18 (!) 172/106         Failed - Valid encounter within last 6 months    Recent Outpatient Visits          10 months ago Diabetic ulcer of other part of right foot associated with diabetes mellitus due to underlying condition, with fat layer exposed ([place])   [facility] -Georges Mouse, MD   11 months ago 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 associated with type 2 diabetes mellitus, unspecified laterality ([place])   [facility], Evie Lacks, MD   1 year ago Hypertension associated with diabetes ([place])   [facility], Evie Lacks, MD   1 year ago Foot ulcer due to secondary DM [facility])   [facility], Evie Lacks, MD   4 years ago Coronary atherosclerosis of native coronary artery   Therapist, music at [employer], [place], DO      Future Appointments            In 2 months Rayburn, Neta Mends, PA-C [place] in normal range and within 180 days    Potassium  Date Value Ref Range Status  06/05/2018 4.5 3.5 - 5.1 mmol/L Final         Passed - Patient is not pregnant

## 2018-07-19 MED ORDER — LISINOPRIL 20 MG PO TABS: 20.0000 mg | ORAL_TABLET | Freq: Every day | ORAL | 0 refills | Status: DC

## 2018-07-19 MED ORDER — GABAPENTIN 300 MG PO CAPS: 300.0000 mg | ORAL_CAPSULE | Freq: Three times a day (TID) | ORAL | 0 refills | Status: DC

## 2018-07-19 MED ORDER — METFORMIN HCL 500 MG PO TABS: 500.0000 mg | ORAL_TABLET | Freq: Two times a day (BID) | ORAL | 0 refills | Status: DC

## 2018-07-26 ENCOUNTER — Encounter (INDEPENDENT_AMBULATORY_CARE_PROVIDER_SITE_OTHER): Payer: Self-pay | Admitting: Family

## 2018-07-26 ENCOUNTER — Other Ambulatory Visit: Payer: Self-pay

## 2018-07-26 ENCOUNTER — Ambulatory Visit (INDEPENDENT_AMBULATORY_CARE_PROVIDER_SITE_OTHER): Payer: BLUE CROSS/BLUE SHIELD | Admitting: Family

## 2018-07-26 VITALS — Ht 64.0 in | Wt 189.0 lb

## 2018-07-26 DIAGNOSIS — L97422 Non-pressure chronic ulcer of left heel and midfoot with fat layer exposed: Secondary | ICD-10-CM | POA: Diagnosis not present

## 2018-07-26 DIAGNOSIS — E08621 Diabetes mellitus due to underlying condition with foot ulcer: Secondary | ICD-10-CM | POA: Diagnosis not present

## 2018-07-26 DIAGNOSIS — E11610 Type 2 diabetes mellitus with diabetic neuropathic arthropathy: Secondary | ICD-10-CM | POA: Diagnosis not present

## 2018-07-26 MED ORDER — MUPIROCIN 2 % EX OINT: 1.0000 "application " | TOPICAL_OINTMENT | Freq: Two times a day (BID) | CUTANEOUS | 6 refills | Status: DC

## 2018-07-26 NOTE — Progress Notes (Signed)
Office Visit Note   Patient: [name]           Date of Birth: July 20, 1970           MRN: [id number] Visit Date: 07/26/2018              Requested by: Cassandria Anger, MD [place], [place] [address] PCP: Cassandria Anger, MD  Chief Complaint  Patient presents with  . 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 Today is wearing regular shoe wear. Has double upright braces custom orthotics and extra-depth shoe is not wearing these today. States they are painful.   Patient states that he has recurrent ulcerations on the plantar aspect of his left foot. States he has a current ulcer that is new, states noticed blood from foot and pain that began a week ago. States this has not been ongoing. Denies any fever or chills. No purulence. He has returned to work over at [employer] working in [employer], is on his feet for prolonged period of time.  Assessment & Plan: Visit Diagnoses:  1. Diabetic ulcer of left midfoot associated with diabetes mellitus due to underlying condition, with fat layer exposed ([place])   2. Diabetic neurogenic arthropathy ([place])     Plan: Patient was given a note that he may return to seated light duty work this following Monday. he needs to minimize standing.  Patient is at risk of amputation.  Follow-Up Instructions: Return in about 15 days (around 08/10/2018).   Ortho Exam  Patient is alert, oriented, no adenopathy, well-dressed, normal affect, normal respiratory effort. Examination patient has a palpable pulse he has a Charcot rocker-bottom deformity of the left foot with a large ulcer.  The great toe amputation is well-healed. After informed consent a 10 blade knife was used to debride the skin and soft tissue back to bleeding viable granulation tissue this was touched with silver  nitrate the ulcerative area is 3 cm in diameter, there is a central ulcer that is 7 mm and 5 mm deep. Does not probe to bone or tendon. There is no abscess no ulceration no signs of infection no signs of active Charcot arthropathy.  Imaging: No results found. No images are attached to the encounter.  Labs: Lab Results  Component Value Date   HGBA1C 7.9 (H) 05/21/2018   HGBA1C 11.3 (H) 06/01/2017   HGBA1C 9.8 (H) 08/20/2016   ESRSEDRATE 54 (H) 08/20/2016   ESRSEDRATE 8 07/24/2012   ESRSEDRATE 4 04/19/2008   CRP 2.2 (H) 08/20/2016   REPTSTATUS 05/26/2018 FINAL 05/21/2018   GRAMSTAIN  07/24/2012    CYTOSPIN NO WBC SEEN NO ORGANISMS SEEN Gram Stain Report Called to,Read Back By and Verified With: [id number]   CULT NO GROWTH 5 DAYS 05/21/2018     Lab Results  Component Value Date   ALBUMIN 3.6 06/05/2018   ALBUMIN 3.2 (L) 06/01/2017   ALBUMIN 2.8 (L) 05/25/2017   PREALBUMIN 18.9 05/21/2018    Body mass index is 32.44 kg/m.  Orders:  No orders of the defined types were placed in this encounter.  Meds ordered this encounter  Medications  . mupirocin ointment (BACTROBAN) 2 %    Sig: Apply 1 application topically 2 (two) times daily.    Dispense:  22 g  Refill:  6     Procedures: No procedures performed  Clinical Data: No additional findings.  ROS:  All other systems negative, except as noted in the HPI. Review of Systems  Constitutional: Negative for chills and fever.  Cardiovascular: Negative for leg swelling.  Skin: Positive for wound. Negative for color change.    Objective: Vital Signs: Ht 5\' 4"  (1.626 m)   Wt 189 lb (85.7 kg)   BMI 32.44 kg/m   Specialty Comments:  No specialty comments available.  PMFS History: Patient Active Problem List   Diagnosis Date Noted  . 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 Type 2 diabetes mellitus with  hyperglycemia, without long-term current use of insulin ([place]) 07/14/2018  . Wound cellulitis 05/22/2018  . Osteomyelitis of foot, acute ([place]) 05/21/2018  . 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 ([place])   . Diabetic ulcer of foot with fat layer exposed ([place]) 08/26/2017  . B12 deficiency 06/15/2017  . Vitamin D deficiency 06/03/2017  . Iron deficiency anemia 06/01/2017  . Diabetic foot infection ([place])   . Osteomyelitis ([place]) 05/25/2017  . Vitreous floaters of right eye 09/15/2016  . Non compliance w medication regimen 09/15/2016  . Depression 09/01/2016  . Foot ulcer due to secondary DM ([place]) 08/20/2016  . Hidradenitis suppurativa 10/13/2015  . Peripheral polyneuropathy 07/07/2015  . Coronary atherosclerosis of native coronary artery 08/17/2013  . Diabetic retinopathy ([place]) 02/02/2013  . Diabetic neuropathy ([place]) 12/01/2012  . Hypertension associated with diabetes ([place]) 08/03/2012  . Tobacco use disorder 08/03/2012  . Erectile dysfunction 08/03/2012  . Diabetes mellitus with neurological manifestations, uncontrolled ([place]) 05/26/2006   Past Medical History:  Diagnosis Date  . Diabetes mellitus type II, uncontrolled ([place])   . Hyperlipidemia   . Hypertension   . Other and unspecified angina pectoris   . Tobacco use     Family History  Problem Relation Age of Onset  . Cancer Mother   . Diabetes Father   . Hypertension Father     Past Surgical History:  Procedure Laterality Date  . AMPUTATION Left 08/22/2016   Procedure: GREAT TOE AMPUTATION;  Surgeon: [name], MD;  Location: [facility];  Service: Orthopedics;  Laterality: Left;  . AMPUTATION Right 05/28/2017   Procedure: AMPUTATION 1st & 3rd TOE;  Surgeon: [name], MD;  Location: [facility];  Service: Orthopedics;  Laterality: Right;  . EYE SURGERY    . LEFT HEART CATHETERIZATION WITH CORONARY ANGIOGRAM N/A 07/31/2013   Procedure: LEFT HEART CATHETERIZATION WITH CORONARY ANGIOGRAM;  Surgeon: Jettie Booze, MD;  Location: [facility] CATH LAB;  Service: Cardiovascular;  Laterality: N/A;  . spinal tap    . toe amputated     Social History   Occupational History  . Occupation: Leisure centre manager: [employer]  Tobacco Use  . Smoking status: Current Every Day Smoker    Packs/day: 1.00    Years: 20.00    Pack years: 20.00    Types: Cigarettes  . Smokeless tobacco: Never Used  . Tobacco comment: wants patches   Substance and Sexual Activity  . Alcohol use: Not Currently    Comment: occ  . Drug use: No  . Sexual activity: Not Currently

## 2018-08-03 ENCOUNTER — Telehealth: Payer: Self-pay | Admitting: Orthopedic Surgery

## 2018-08-03 NOTE — Telephone Encounter (Signed)
Received voicemail message from patient stating he was recently out of work on light duty and his employer need to know specifically how long he can stand in 6 hours without sitting down? Patient asked if his employer Rolanda Lundborg can be called with this information. The number to contact Levada Dy is [phone]. The number to contact patient is [phone]

## 2018-08-04 NOTE — Telephone Encounter (Signed)
Can you call?

## 2018-08-04 NOTE — Telephone Encounter (Signed)
Patient called back and wanted to know how long he could stand in between his 6 hours of worktime and it was advised to him no mo re than 15 mins every 3-4 hours if he can tolerate.

## 2018-08-04 NOTE — Telephone Encounter (Signed)
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 If they have any other concerns please advise.

## 2018-08-07 ENCOUNTER — Other Ambulatory Visit (INDEPENDENT_AMBULATORY_CARE_PROVIDER_SITE_OTHER): Payer: Self-pay

## 2018-08-10 ENCOUNTER — Other Ambulatory Visit: Payer: Self-pay

## 2018-08-10 ENCOUNTER — Encounter: Payer: Self-pay | Admitting: Orthopedic Surgery

## 2018-08-10 ENCOUNTER — Ambulatory Visit (INDEPENDENT_AMBULATORY_CARE_PROVIDER_SITE_OTHER): Payer: BLUE CROSS/BLUE SHIELD | Admitting: Orthopedic Surgery

## 2018-08-10 VITALS — Ht 64.0 in | Wt 189.0 lb

## 2018-08-10 DIAGNOSIS — E11610 Type 2 diabetes mellitus with diabetic neuropathic arthropathy: Secondary | ICD-10-CM

## 2018-08-10 DIAGNOSIS — E08621 Diabetes mellitus due to underlying condition with foot ulcer: Secondary | ICD-10-CM

## 2018-08-10 DIAGNOSIS — L97422 Non-pressure chronic ulcer of left heel and midfoot with fat layer exposed: Secondary | ICD-10-CM

## 2018-08-11 ENCOUNTER — Ambulatory Visit (INDEPENDENT_AMBULATORY_CARE_PROVIDER_SITE_OTHER): Payer: BLUE CROSS/BLUE SHIELD | Admitting: Internal Medicine

## 2018-08-11 ENCOUNTER — Telehealth: Payer: Self-pay | Admitting: Orthopedic Surgery

## 2018-08-11 ENCOUNTER — Encounter: Payer: Self-pay | Admitting: Internal Medicine

## 2018-08-11 DIAGNOSIS — E1165 Type 2 diabetes mellitus with hyperglycemia: Secondary | ICD-10-CM | POA: Diagnosis not present

## 2018-08-11 DIAGNOSIS — E113593 Type 2 diabetes mellitus with proliferative diabetic retinopathy without macular edema, bilateral: Secondary | ICD-10-CM | POA: Diagnosis not present

## 2018-08-11 NOTE — Progress Notes (Signed)
Virtual Visit via Video Note  I connected with [name] on 08/11/18 at  2:00 PM EDT by a video enabled telemedicine application and verified that I am speaking with the correct person using two identifiers.   I discussed the limitations of evaluation and management by telemedicine and the availability of in person appointments. The patient expressed understanding and agreed to proceed.   -Location of the patient :Home -Location of the provider : Office -The names of all persons participating in the telemedicine service : Pt and myself        Name: [name]  Age/ Sex: 48 y.o., male   MRN/ DOB: [id number], 1970-05-12     PCP: Cassandria Anger, MD   Reason for Endocrinology Evaluation: Type 2 Diabetes Mellitus     Initial Endocrinology Clinic Visit: 07/14/2018    PATIENT IDENTIFIER: [name] is a 48 y.o. male with a past medical history of T2DM, and HTN, Hx  toe amputations Bilaterally  The patient has followed with Endocrinology clinic since 07/14/2018 for consultative assistance with management of his diabetes.  DIABETIC HISTORY:  [name] was diagnosed with T2DM over 20 years ago. He has been on Metformin since diagnosis, he also has been on glipizide. He was on insulin until sometime in 2019. His hemoglobin A1c has ranged from 7.0% in 2014, peaking at 11.3% in 2019.   SUBJECTIVE:   During the last visit (07/14/2018): BG's were between 140-200 mg/dL he titrated Metformin   Today (08/11/2018): [name] is here for a 4 week virtual follow up on diabetes management.  He checks his blood sugars 1 times daily, preprandial to breakfast . The patient has not had hypoglycemic episodes since the last clinic visit.Otherwise, the patient has not required any recent emergency interventions for hypoglycemia and has not had recent hospitalizations secondary to hyper or hypoglycemic episodes.    ROS: As per HPI and as detailed below: Review of Systems   Constitutional: Negative for fever.  Respiratory: Negative for cough and shortness of breath.   Cardiovascular: Negative for chest pain and palpitations.  Gastrointestinal: Negative for diarrhea and nausea.  Genitourinary: Negative for frequency.  Endo/Heme/Allergies: Negative for polydipsia.      HOME DIABETES REGIMEN:  Metformin 500 mg XR 2 tabs BID - Taking 3 a day     GLUCOSE LOG :   Date  Fasting   08/11/18 120  5/14 115  5/13 125  5/12 120     HISTORY:  Past Medical History:  Past Medical History:  Diagnosis Date  . Diabetes mellitus type II, uncontrolled ([place])   . Hyperlipidemia   . Hypertension   . Other and unspecified angina pectoris   . Tobacco use     Past Surgical History:  Past Surgical History:  Procedure Laterality Date  . AMPUTATION Left 08/22/2016   Procedure: GREAT TOE AMPUTATION;  Surgeon: [name], MD;  Location: [facility];  Service: Orthopedics;  Laterality: Left;  . AMPUTATION Right 05/28/2017   Procedure: AMPUTATION 1st & 3rd TOE;  Surgeon: [name], MD;  Location: [facility];  Service: Orthopedics;  Laterality: Right;  . EYE SURGERY    . LEFT HEART CATHETERIZATION WITH CORONARY ANGIOGRAM N/A 07/31/2013   Procedure: LEFT HEART CATHETERIZATION WITH CORONARY ANGIOGRAM;  Surgeon: Jettie Booze, MD;  [facility] CATH LAB;  Service: Cardiovascular;  Laterality: N/A;  . spinal tap    . toe amputated       Social History:  reports that he has been smoking cigarettes. He has a 20.00 pack-year smoking history. He has never used smokeless tobacco. He reports previous alcohol use. He reports that he does not use drugs. Family History:  Family History  Problem Relation Age of Onset  . Cancer Mother   . Diabetes Father   . Hypertension Father       HOME MEDICATIONS: Allergies as of 08/11/2018      Reactions   Bee Venom Swelling   SWELLING REACTION UNSPECIFIED    Penicillins Hives, Other (See Comments)   PATIENT HAS HAD A PCN REACTION  WITH IMMEDIATE RASH, FACIAL/TONGUE/THROAT SWELLING, SOB, OR LIGHTHEADEDNESS WITH HYPOTENSION:  #  #  #  YES  #  #  #   Has patient had a PCN reaction causing severe rash involving mucus membranes or skin necrosis: No Has patient had a PCN reaction that required hospitalization: No Has patient had a PCN reaction occurring within the last 10 years: No Allergy Severity noted prior to 05/28/17      Medication List       Accurate as of Aug 11, 2018  9:03 AM. If you have any questions, ask your nurse or doctor.        aspirin EC 81 MG tablet Take 81 mg by mouth daily.   atorvastatin 20 MG tablet Commonly known as:  LIPITOR Take 1 tablet (20 mg total) by mouth daily.   blood glucose meter kit and supplies Kit Dispense based on patient and insurance preference. Use up to four times daily as directed. (FOR ICD-9 250.00, 250.01).   citalopram 20 MG tablet Commonly known as:  CeleXA Take 1 tablet (20 mg total) by mouth daily.   doxycycline 100 MG capsule Commonly known as:  VIBRAMYCIN Take 1 capsule (100 mg total) by mouth 2 (two) times daily.   ferrous sulfate 325 (65 FE) MG tablet Take 1 tablet (325 mg total) by mouth daily.   gabapentin 300 MG capsule Commonly known as:  Neurontin Take 1 capsule (300 mg total) by mouth 3 (three) times daily. Patient needs office visit before refills will be given   lisinopril 20 MG tablet Commonly known as:  ZESTRIL Take 1 tablet (20 mg total) by mouth daily. Patient needs office visit before refills will be given   metFORMIN 500 MG tablet Commonly known as:  GLUCOPHAGE Take 1 tablet (500 mg total) by mouth 2 (two) times daily with a meal. Patient needs office visit before refills will be given   metoprolol tartrate 25 MG tablet Commonly known as:  LOPRESSOR Take 1 tablet (25 mg total) by mouth 2 (two) times daily.   mupirocin ointment 2 % Commonly known as:  Bactroban Apply 1 application topically 2 (two) times daily.        DATA  REVIEWED:  Lab Results  Component Value Date   HGBA1C 7.9 (H) 05/21/2018   HGBA1C 11.3 (H) 06/01/2017   HGBA1C 9.8 (H) 08/20/2016   Lab Results  Component Value Date   MICROALBUR 92.0 (H) 06/01/2017   LDLCALC 43 06/01/2017   CREATININE 1.49 (H) 06/05/2018   Lab Results  Component Value Date   MICRALBCREAT 50.3 (H) 06/01/2017     Lab Results  Component Value Date   CHOL 93 06/01/2017   HDL 39.70 06/01/2017   LDLCALC 43 06/01/2017   TRIG 51.0 06/01/2017   CHOLHDL 2 06/01/2017         ASSESSMENT / PLAN / RECOMMENDATIONS:   1) Type 2 Diabetes  Mellitus, Historically Poorly controlled, With Neuropathic complications, retinopathy and B/L toe amputations - Most recent A1c of 7.9 %. Goal A1c <  7.0 %.   Plan:  - His BG's look optimal, we will not be making any changes at this time - I have asked him to check BG's at bedtime as well.   MEDICATIONS:  Continue Metformin 500 mg XR 2 tab with Breakfast and 1 tablet with Supper   EDUCATION / INSTRUCTIONS:  BG monitoring instructions: Patient is instructed to check his blood sugars 2 times a day, fasting and bedtime.  Call [place] [facility] if: BG persistently < 70 or > 300. . I reviewed the Rule of 15 for the treatment of hypoglycemia in detail with the patient. Literature supplied.    I discussed the assessment and treatment plan with the patient. The patient was provided an opportunity to ask questions and all were answered. The patient agreed with the plan and demonstrated an understanding of the instructions.   The patient was advised to call back or seek an in-person evaluation if the symptoms worsen or if the condition fails to improve as anticipated.    F/U in 3 months    Signed electronically by: Mack Guise, MD  [facility] Endocrinology  [facility] [address] [place], [place] [address] Phone: [phone] FAX: [phone]   CC: Cassandria Anger, MD  [place] [address] Phone: [phone]  Fax: [phone]  Return to Endocrinology clinic as below: Future Appointments  Date Time Provider [place]  08/11/2018  2:00 PM [place] Stauber, Melanie Crazier, MD [facility] None  09/11/2018  9:15 AM [name], MD [facility] None  10/10/2018  8:15 AM Rayburn, Neta Mends, PA-C [facility] None

## 2018-08-11 NOTE — Telephone Encounter (Signed)
Blanch Media would like [name] to call her about a note that was sent for the PT.

## 2018-08-11 NOTE — Telephone Encounter (Signed)
Returned call to Blanch Media at [employer] job and she states that she received patient's note and wanted to know how his appt went yesterday and I informed her that our physician has not updated his chart yet and as soon as he do we will give her a call to update her of patient's condition. She wants to be informed if patient will be returning to work or will he need to be released for disability and needs this in his office notes. I will update Dr Sharol Given of this on Monday and hold this message.

## 2018-08-15 ENCOUNTER — Encounter: Payer: Self-pay | Admitting: Orthopedic Surgery

## 2018-08-15 NOTE — Telephone Encounter (Signed)
Patient's HR office was called and indeed informed that per Dr Sharol Given recommended at this time that patient is permanently disabled and needs to sign up for disability. She informed me that this was all she needed from [place] at this time.

## 2018-08-15 NOTE — Progress Notes (Signed)
Office Visit Note   Patient: [name]           Date of Birth: Aug 08, 1970           MRN: [id number] Visit Date: 08/10/2018              Requested by: Cassandria Anger, MD [place], [place] [address] PCP: Cassandria Anger, MD  Chief Complaint  Patient presents with  . Right Foot - Routine Post Op    05/28/17 right foot 1st & 3rd toe amp      HPI: Patient is a 48 year old gentleman who presents in follow-up for both lower extremities.  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  Patient states that even with minimal weightbearing he is having worsening of the ulcers on both feet.  Assessment & Plan: Visit Diagnoses:  1. Diabetic neurogenic arthropathy ([place])   2. Diabetic ulcer of left midfoot associated with diabetes mellitus due to underlying condition, with fat layer exposed ([place])   3. 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 ([place])     Plan: The ulcers were debrided on both feet.  With patient's diabetic insensate neuropathy history of partial foot amputations with this Charcot arthropathy and present ulcerations.  I have recommended the patient be permanently disabled.  Discussed that with prolonged weightbearing on either foot he could require an amputation.  Follow-Up Instructions: Return in about 4 weeks (around 09/07/2018).   Ortho Exam  Patient is alert, oriented, no adenopathy, well-dressed, normal affect, normal respiratory effort. Examination patient has good pulses.  There is no signs of any infection or active Charcot process in either foot.  The left foot has a large Wegner grade 1 ulcer.  After informed consent a 10 blade knife was used to debride the skin and soft tissue back to healthy viable granulation tissue the ulcer is 6 x 4 cm and 3 mm deep.  Right foot he also has another Wegner grade 1 ulcer which is 5 cm in diameter.  After informed consent a 10 blade knife was used  to debride the skin and soft tissue back to healthy viable granulation tissue.  Silver nitrate was used on both feet and a dry dressing was applied.  Imaging: No results found. No images are attached to the encounter.  Labs: Lab Results  Component Value Date   HGBA1C 7.9 (H) 05/21/2018   HGBA1C 11.3 (H) 06/01/2017   HGBA1C 9.8 (H) 08/20/2016   ESRSEDRATE 54 (H) 08/20/2016   ESRSEDRATE 8 07/24/2012   ESRSEDRATE 4 04/19/2008   CRP 2.2 (H) 08/20/2016   REPTSTATUS 05/26/2018 FINAL 05/21/2018   GRAMSTAIN  07/24/2012    CYTOSPIN NO WBC SEEN NO ORGANISMS SEEN Gram Stain Report Called to,Read Back By and Verified With: [id number]   CULT NO GROWTH 5 DAYS 05/21/2018     Lab Results  Component Value Date   ALBUMIN 3.6 06/05/2018   ALBUMIN 3.2 (L) 06/01/2017   ALBUMIN 2.8 (L) 05/25/2017   PREALBUMIN 18.9 05/21/2018    Body mass index is 32.44 kg/m.  Orders:  No orders of the defined types were placed in this encounter.  No orders of the defined types were placed in this encounter.    Procedures: No procedures performed  Clinical Data: No additional findings.  ROS:  All other systems negative, except as noted in the HPI. Review of Systems  Objective: Vital Signs: Ht 5\' 4"  (1.626 m)  Wt 189 lb (85.7 kg)   BMI 32.44 kg/m   Specialty Comments:  No specialty comments available.  PMFS History: Patient Active Problem List   Diagnosis Date Noted  . 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 Type 2 diabetes mellitus with hyperglycemia, without long-term current use of insulin ([place]) 07/14/2018  . Wound cellulitis 05/22/2018  . Osteomyelitis of foot, acute ([place]) 05/21/2018  . 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 ([place])   . Diabetic ulcer of foot with fat layer exposed ([place]) 08/26/2017  . B12 deficiency 06/15/2017  . Vitamin D deficiency  06/03/2017  . Iron deficiency anemia 06/01/2017  . Diabetic foot infection ([place])   . Osteomyelitis ([place]) 05/25/2017  . Vitreous floaters of right eye 09/15/2016  . Non compliance w medication regimen 09/15/2016  . Depression 09/01/2016  . Foot ulcer due to secondary DM ([place]) 08/20/2016  . Hidradenitis suppurativa 10/13/2015  . Peripheral polyneuropathy 07/07/2015  . Coronary atherosclerosis of native coronary artery 08/17/2013  . Diabetic retinopathy ([place]) 02/02/2013  . Diabetic neuropathy ([place]) 12/01/2012  . Hypertension associated with diabetes ([place]) 08/03/2012  . Tobacco use disorder 08/03/2012  . Erectile dysfunction 08/03/2012  . Diabetes mellitus with neurological manifestations, uncontrolled ([place]) 05/26/2006   Past Medical History:  Diagnosis Date  . Diabetes mellitus type II, uncontrolled ([place])   . Hyperlipidemia   . Hypertension   . Other and unspecified angina pectoris   . Tobacco use     Family History  Problem Relation Age of Onset  . Cancer Mother   . Diabetes Father   . Hypertension Father     Past Surgical History:  Procedure Laterality Date  . AMPUTATION Left 08/22/2016   Procedure: GREAT TOE AMPUTATION;  Surgeon: [name], MD;  Location: [facility];  Service: Orthopedics;  Laterality: Left;  . AMPUTATION Right 05/28/2017   Procedure: AMPUTATION 1st & 3rd TOE;  Surgeon: [name], MD;  Location: [facility];  Service: Orthopedics;  Laterality: Right;  . EYE SURGERY    . LEFT HEART CATHETERIZATION WITH CORONARY ANGIOGRAM N/A 07/31/2013   Procedure: LEFT HEART CATHETERIZATION WITH CORONARY ANGIOGRAM;  Surgeon: Jettie Booze, MD;  Location: [facility] CATH LAB;  Service: Cardiovascular;  Laterality: N/A;  . spinal tap    . toe amputated     Social History   Occupational History  . Occupation: Leisure centre manager: [employer]  Tobacco Use  . Smoking status: Current Every Day Smoker    Packs/day: 1.00    Years: 20.00    Pack years: 20.00    Types:  Cigarettes  . Smokeless tobacco: Never Used  . Tobacco comment: wants patches   Substance and Sexual Activity  . Alcohol use: Not Currently    Comment: occ  . Drug use: No  . Sexual activity: Not Currently

## 2018-09-11 ENCOUNTER — Encounter: Payer: Self-pay | Admitting: Orthopedic Surgery

## 2018-09-11 ENCOUNTER — Other Ambulatory Visit: Payer: Self-pay

## 2018-09-11 ENCOUNTER — Ambulatory Visit (INDEPENDENT_AMBULATORY_CARE_PROVIDER_SITE_OTHER): Payer: BC Managed Care – PPO | Admitting: Orthopedic Surgery

## 2018-09-11 VITALS — Ht 64.0 in | Wt 180.0 lb

## 2018-09-11 DIAGNOSIS — L97422 Non-pressure chronic ulcer of left heel and midfoot with fat layer exposed: Secondary | ICD-10-CM | POA: Diagnosis not present

## 2018-09-11 DIAGNOSIS — E08621 Diabetes mellitus due to underlying condition with foot ulcer: Secondary | ICD-10-CM | POA: Diagnosis not present

## 2018-09-11 DIAGNOSIS — E11610 Type 2 diabetes mellitus with diabetic neuropathic arthropathy: Secondary | ICD-10-CM | POA: Diagnosis not present

## 2018-09-11 DIAGNOSIS — L97511 Non-pressure chronic ulcer of other part of right foot limited to breakdown of skin: Secondary | ICD-10-CM | POA: Insufficient documentation

## 2018-09-11 NOTE — Progress Notes (Addendum)
Office Visit Note   Patient: [name]           Date of Birth: 05-16-1970           MRN: [id number] Visit Date: 09/11/2018              Requested by: Cassandria Anger, MD [place],  [place] [address] PCP: Cassandria Anger, MD  Chief Complaint  Patient presents with  . Right Foot - Follow-up  . Left Foot - Follow-up      HPI: Patient is a 48 year old gentleman who presents in follow-up for diabetic insensate neuropathic ulcers on the plantar aspect of both feet.  Patient is currently been doing dressing changes with Bactroban every 2 days.  Patient did have some donuts currently does not have any at this time.  He has custom orthotics with a double upright brace on the left.  Assessment & Plan: Visit Diagnoses:  1. Diabetic ulcer of left midfoot associated with diabetes mellitus due to underlying condition, with fat layer exposed ([place])   2. 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 ([place])   3. Non-pressure chronic ulcer of other part of right foot limited to breakdown of skin ([place])     Plan: A felt relieving donut was placed beneath his orthotic for the left shoe.  Patient will decrease his activities.  Follow-Up Instructions: Return in about 3 weeks (around 10/02/2018).   Ortho Exam  Patient is alert, oriented, no adenopathy, well-dressed, normal affect, normal respiratory effort. Examination patient has good pulses in both feet.  He has dorsiflexion to neutral bilaterally.  He has a large Wegner grade 1 ulcer on the left foot and 3 Wegner grade 1 ulcers on the right foot.  After informed consent a 10 blade knife was used to debride the skin and soft tissue back to healthy viable granulation tissue on the left foot.  The ulcer is 5 cm in diameter.  Silver nitrate was used for hemostasis.  The ulcer is 5 mm deep.  Examination the right foot there are 2 ulcers over the forefoot 1 ulcer over the midfoot.  These were also debrided of skin  and soft tissue with a 10 blade knife.  Silver nitrate was used for hemostasis the forefoot ulcers were 2 cm in diameter each and 3 mm deep in the midfoot ulcer is 4 cm in diameter and 3 mm deep.  Dry dressings were applied to both feet a felt relieving pad was placed under the orthotic for the left foot.  Imaging: No results found. No images are attached to the encounter.  Labs: Lab Results  Component Value Date   HGBA1C 7.9 (H) 05/21/2018   HGBA1C 11.3 (H) 06/01/2017   HGBA1C 9.8 (H) 08/20/2016   ESRSEDRATE 54 (H) 08/20/2016   ESRSEDRATE 8 07/24/2012   ESRSEDRATE 4 04/19/2008   CRP 2.2 (H) 08/20/2016   REPTSTATUS 05/26/2018 FINAL 05/21/2018   GRAMSTAIN  07/24/2012    CYTOSPIN NO WBC SEEN NO ORGANISMS SEEN Gram Stain Report Called to,Read Back By and Verified With: [id number]   CULT NO GROWTH 5 DAYS 05/21/2018     Lab Results  Component Value Date   ALBUMIN 3.6 06/05/2018   ALBUMIN 3.2 (L) 06/01/2017   ALBUMIN 2.8 (L) 05/25/2017   PREALBUMIN 18.9 05/21/2018    Body mass index is 30.9 kg/m.  Orders:  No orders of the defined types were placed in this encounter.  No orders of the defined types  were placed in this encounter.    Procedures: No procedures performed  Clinical Data: No additional findings.  ROS:  All other systems negative, except as noted in the HPI. Review of Systems  Objective: Vital Signs: Ht 5\' 4"  (1.626 m)   Wt 180 lb (81.6 kg)   BMI 30.90 kg/m   Specialty Comments:  No specialty comments available.  PMFS History: Patient Active Problem List   Diagnosis Date Noted  . Non-pressure chronic ulcer of other part of right foot limited to breakdown of skin ([place]) 09/11/2018  . 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 Type 2 diabetes mellitus with hyperglycemia, without long-term current use of insulin ([place]) 07/14/2018  . Wound  cellulitis 05/22/2018  . Osteomyelitis of foot, acute ([place]) 05/21/2018  . 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 ([place])   . Diabetic ulcer of left midfoot associated with diabetes mellitus due to underlying condition, with fat layer exposed ([place]) 08/26/2017  . B12 deficiency 06/15/2017  . Vitamin D deficiency 06/03/2017  . Iron deficiency anemia 06/01/2017  . Diabetic foot infection ([place])   . Osteomyelitis ([place]) 05/25/2017  . Vitreous floaters of right eye 09/15/2016  . Non compliance w medication regimen 09/15/2016  . Depression 09/01/2016  . Foot ulcer due to secondary DM ([place]) 08/20/2016  . Hidradenitis suppurativa 10/13/2015  . Peripheral polyneuropathy 07/07/2015  . Coronary atherosclerosis of native coronary artery 08/17/2013  . Diabetic retinopathy ([place]) 02/02/2013  . Diabetic neuropathy ([place]) 12/01/2012  . Hypertension associated with diabetes ([place]) 08/03/2012  . Tobacco use disorder 08/03/2012  . Erectile dysfunction 08/03/2012  . Diabetes mellitus with neurological manifestations, uncontrolled ([place]) 05/26/2006   Past Medical History:  Diagnosis Date  . Diabetes mellitus type II, uncontrolled ([place])   . Hyperlipidemia   . Hypertension   . Other and unspecified angina pectoris   . Tobacco use     Family History  Problem Relation Age of Onset  . Cancer Mother   . Diabetes Father   . Hypertension Father     Past Surgical History:  Procedure Laterality Date  . AMPUTATION Left 08/22/2016   Procedure: GREAT TOE AMPUTATION;  Surgeon: [name], MD;  Location: [facility];  Service: Orthopedics;  Laterality: Left;  . AMPUTATION Right 05/28/2017   Procedure: AMPUTATION 1st & 3rd TOE;  Surgeon: [name], MD;  Location: [facility];  Service: Orthopedics;  Laterality: Right;  . EYE SURGERY    . LEFT HEART CATHETERIZATION WITH CORONARY ANGIOGRAM N/A 07/31/2013   Procedure: LEFT HEART CATHETERIZATION WITH CORONARY ANGIOGRAM;  Surgeon: Jettie Booze, MD;   Location[facility] C[facility] LAB;  Service: Cardiovascular;  Laterality: N/A;  . spinal tap    . toe amputated     Social History   Occupational History  . Occupation: Leisure centre manager: [employer]  Tobacco Use  . Smoking status: Current Every Day Smoker    Packs/day: 1.00    Years: 20.00    Pack years: 20.00    Types: Cigarettes  . Smokeless tobacco: Never Used  . Tobacco comment: wants patches   Substance and Sexual Activity  . Alcohol use: Not Currently    Comment: occ  . Drug use: No  . Sexual activity: Not Currently

## 2018-09-28 ENCOUNTER — Encounter: Payer: BLUE CROSS/BLUE SHIELD | Admitting: Internal Medicine

## 2018-10-02 ENCOUNTER — Other Ambulatory Visit: Payer: Self-pay

## 2018-10-02 ENCOUNTER — Ambulatory Visit (INDEPENDENT_AMBULATORY_CARE_PROVIDER_SITE_OTHER): Payer: Self-pay | Admitting: Orthopedic Surgery

## 2018-10-02 ENCOUNTER — Encounter: Payer: Self-pay | Admitting: Orthopedic Surgery

## 2018-10-02 VITALS — Ht 64.0 in | Wt 180.0 lb

## 2018-10-02 DIAGNOSIS — E08621 Diabetes mellitus due to underlying condition with foot ulcer: Secondary | ICD-10-CM

## 2018-10-02 DIAGNOSIS — L97422 Non-pressure chronic ulcer of left heel and midfoot with fat layer exposed: Secondary | ICD-10-CM

## 2018-10-02 DIAGNOSIS — E11610 Type 2 diabetes mellitus with diabetic neuropathic arthropathy: Secondary | ICD-10-CM

## 2018-10-02 NOTE — Progress Notes (Signed)
Office Visit Note   Patient: [name]           Date of Birth: 1971/02/22           MRN: [id number] Visit Date: 10/02/2018              Requested by: Cassandria Anger, MD [place],  [place] [address] PCP: Cassandria Anger, MD  Chief Complaint  Patient presents with  . Left Foot - Follow-up  . Right Foot - Follow-up      HPI: Patient is a 48 year old gentleman who presents in follow-up for diabetic insensate neuropathy Charcot collapse with a large Wegner grade 1 ulcer of the plantar aspect of the left foot.  Patient has been using Bactroban for dressing changes complains of the ulcer getting larger with more drainage.  Assessment & Plan: Visit Diagnoses:  1. Diabetic ulcer of left midfoot associated with diabetes mellitus due to underlying condition, with fat layer exposed ([place])   2. 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 ([place])     Plan: Ulcer was debrided of skin and soft tissue he has a good felt relieving pad underneath the custom orthotics.  Continue with wound care.  Follow-Up Instructions: Return in about 4 weeks (around 10/30/2018).   Ortho Exam  Patient is alert, oriented, no adenopathy, well-dressed, normal affect, normal respiratory effort. Examination patient has a palpable dorsalis pedis pulse there is no redness no cellulitis no signs of infection or active Charcot arthropathy.  Patient has a massive ulcer on the Charcot rocker-bottom deformity.  After informed consent 10 blade knife was used to debride the skin and soft tissue back to healthy viable granulation tissue this was touched with silver nitrate the ulcer is 5 x 6 cm and 3 mm deep.  Band-Aid and ointment was applied.  Imaging: No results found. No images are attached to the encounter.  Labs: Lab Results  Component Value Date   HGBA1C 7.9 (H) 05/21/2018   HGBA1C 11.3 (H) 06/01/2017   HGBA1C 9.8 (H) 08/20/2016   ESRSEDRATE 54 (H) 08/20/2016   ESRSEDRATE 8 07/24/2012   ESRSEDRATE 4 04/19/2008   CRP 2.2 (H) 08/20/2016   REPTSTATUS 05/26/2018 FINAL 05/21/2018   GRAMSTAIN  07/24/2012    CYTOSPIN NO WBC SEEN NO ORGANISMS SEEN Gram Stain Report Called to,Read Back By and Verified With: [id number]   CULT NO GROWTH 5 DAYS 05/21/2018     Lab Results  Component Value Date   ALBUMIN 3.6 06/05/2018   ALBUMIN 3.2 (L) 06/01/2017   ALBUMIN 2.8 (L) 05/25/2017   PREALBUMIN 18.9 05/21/2018    No results found for: MG Lab Results  Component Value Date   VD25OH 2.63 (L) 06/01/2017    Lab Results  Component Value Date   PREALBUMIN 18.9 05/21/2018   CBC EXTENDED Latest Ref Rng & Units 06/05/2018 05/22/2018 05/21/2018  WBC 4.0 - 10.5 K/uL 10.7(H) 11.4(H) 11.1(H)  RBC 4.22 - 5.81 MIL/uL 4.49 3.83(L) 4.26  HGB 13.0 - 17.0 g/dL 12.2(L) 9.9(L) 11.7(L)  HCT 39.0 - 52.0 % 40.8 33.3(L) 37.5(L)  PLT 150 - 400 K/uL 272 264 314  NEUTROABS 1.7 - 7.7 K/uL - - 7.4  LYMPHSABS 0.7 - 4.0 K/uL - - 2.6     Body mass index is 30.9 kg/m.  Orders:  No orders of the defined types were placed in this encounter.  No orders of the defined types were placed in this encounter.    Procedures: No procedures performed  Clinical Data: No additional findings.  ROS:  All other systems negative, except as noted in the HPI. Review of Systems  Objective: Vital Signs: Ht 5\' 4"  (1.626 m)   Wt 180 lb (81.6 kg)   BMI 30.90 kg/m   Specialty Comments:  No specialty comments available.  PMFS History: Patient Active Problem List   Diagnosis Date Noted  . Non-pressure chronic ulcer of other part of right foot limited to breakdown of skin ([place]) 09/11/2018  . 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 Type 2 diabetes mellitus with hyperglycemia, without long-term current use of insulin ([place]) 07/14/2018  . Wound cellulitis 05/22/2018  .  Osteomyelitis of foot, acute ([place]) 05/21/2018  . 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 ([place])   . Diabetic ulcer of left midfoot associated with diabetes mellitus due to underlying condition, with fat layer exposed ([place]) 08/26/2017  . B12 deficiency 06/15/2017  . Vitamin D deficiency 06/03/2017  . Iron deficiency anemia 06/01/2017  . Diabetic foot infection ([place])   . Osteomyelitis ([place]) 05/25/2017  . Vitreous floaters of right eye 09/15/2016  . Non compliance w medication regimen 09/15/2016  . Depression 09/01/2016  . Foot ulcer due to secondary DM ([place]) 08/20/2016  . Hidradenitis suppurativa 10/13/2015  . Peripheral polyneuropathy 07/07/2015  . Coronary atherosclerosis of native coronary artery 08/17/2013  . Diabetic retinopathy ([place]) 02/02/2013  . Diabetic neuropathy ([place]) 12/01/2012  . Hypertension associated with diabetes ([place]) 08/03/2012  . Tobacco use disorder 08/03/2012  . Erectile dysfunction 08/03/2012  . Diabetes mellitus with neurological manifestations, uncontrolled ([place]) 05/26/2006   Past Medical History:  Diagnosis Date  . Diabetes mellitus type II, uncontrolled ([place])   . Hyperlipidemia   . Hypertension   . Other and unspecified angina pectoris   . Tobacco use     Family History  Problem Relation Age of Onset  . Cancer Mother   . Diabetes Father   . Hypertension Father     Past Surgical History:  Procedure Laterality Date  . AMPUTATION Left 08/22/2016   Procedure: GREAT TOE AMPUTATION;  Surgeon: [name], MD;  Location: [facility];  Service: Orthopedics;  Laterality: Left;  . AMPUTATION Right 05/28/2017   Procedure: AMPUTATION 1st & 3rd TOE;  Surgeon: [name], MD;  Location: [facility];  Service: Orthopedics;  Laterality: Right;  . EYE SURGERY    . LEFT HEART CATHETERIZATION WITH CORONARY ANGIOGRAM N/A 07/31/2013   Procedure: LEFT HEART CATHETERIZATION WITH CORONARY ANGIOGRAM;  Surgeon: Jettie Booze, MD;  Location[facility] LAB;   Service: Cardiovascular;  Laterality: N/A;  . spinal tap    . toe amputated     Social History   Occupational History  . Occupation: Leisure centre manager: [employer]  Tobacco Use  . Smoking status: Current Every Day Smoker    Packs/day: 1.00    Years: 20.00    Pack years: 20.00    Types: Cigarettes  . Smokeless tobacco: Never Used  . Tobacco comment: wants patches   Substance and Sexual Activity  . Alcohol use: Not Currently    Comment: occ  . Drug use: No  . Sexual activity: Not Currently

## 2018-10-10 ENCOUNTER — Ambulatory Visit: Payer: Self-pay | Admitting: Physician Assistant

## 2018-10-17 ENCOUNTER — Encounter: Payer: Self-pay | Admitting: Internal Medicine

## 2018-10-17 ENCOUNTER — Ambulatory Visit (INDEPENDENT_AMBULATORY_CARE_PROVIDER_SITE_OTHER): Payer: BLUE CROSS/BLUE SHIELD | Admitting: Internal Medicine

## 2018-10-17 ENCOUNTER — Other Ambulatory Visit (INDEPENDENT_AMBULATORY_CARE_PROVIDER_SITE_OTHER): Payer: BLUE CROSS/BLUE SHIELD

## 2018-10-17 ENCOUNTER — Other Ambulatory Visit: Payer: Self-pay

## 2018-10-17 DIAGNOSIS — E538 Deficiency of other specified B group vitamins: Secondary | ICD-10-CM | POA: Diagnosis not present

## 2018-10-17 DIAGNOSIS — E113593 Type 2 diabetes mellitus with proliferative diabetic retinopathy without macular edema, bilateral: Secondary | ICD-10-CM | POA: Diagnosis not present

## 2018-10-17 DIAGNOSIS — E559 Vitamin D deficiency, unspecified: Secondary | ICD-10-CM | POA: Diagnosis not present

## 2018-10-17 DIAGNOSIS — F321 Major depressive disorder, single episode, moderate: Secondary | ICD-10-CM | POA: Diagnosis not present

## 2018-10-17 DIAGNOSIS — E1159 Type 2 diabetes mellitus with other circulatory complications: Secondary | ICD-10-CM | POA: Diagnosis not present

## 2018-10-17 DIAGNOSIS — I152 Hypertension secondary to endocrine disorders: Secondary | ICD-10-CM

## 2018-10-17 DIAGNOSIS — I10 Essential (primary) hypertension: Secondary | ICD-10-CM | POA: Diagnosis not present

## 2018-10-17 DIAGNOSIS — E118 Type 2 diabetes mellitus with unspecified complications: Secondary | ICD-10-CM

## 2018-10-17 HISTORY — DX: Type 2 diabetes mellitus with unspecified complications: E11.8

## 2018-10-17 LAB — HEPATIC FUNCTION PANEL
ALT: 10 U/L (ref 0–53)
AST: 7 U/L (ref 0–37)
Albumin: 3.9 g/dL (ref 3.5–5.2)
Alkaline Phosphatase: 130 U/L — ABNORMAL HIGH (ref 39–117)
Bilirubin, Direct: 0.1 mg/dL (ref 0.0–0.3)
Total Bilirubin: 0.2 mg/dL (ref 0.2–1.2)
Total Protein: 7.3 g/dL (ref 6.0–8.3)

## 2018-10-17 LAB — URINALYSIS, ROUTINE W REFLEX MICROSCOPIC
Bilirubin Urine: NEGATIVE
Ketones, ur: NEGATIVE
Leukocytes,Ua: NEGATIVE
Nitrite: NEGATIVE
Specific Gravity, Urine: >=1.03 — AB (ref 1.000–1.030)
Total Protein, Urine: >=300 — AB
Urine Glucose: >=1000 — AB
Urobilinogen, UA: 0.2 (ref 0.0–1.0)
pH: 5 (ref 5.0–8.0)

## 2018-10-17 LAB — VITAMIN B12: Vitamin B-12: 348 pg/mL (ref 211–911)

## 2018-10-17 LAB — CBC WITH DIFFERENTIAL/PLATELET
Basophils Absolute: 0.1 10*3/uL (ref 0.0–0.1)
Basophils Relative: 0.9 % (ref 0.0–3.0)
Eosinophils Absolute: 0.2 10*3/uL (ref 0.0–0.7)
Eosinophils Relative: 1.6 % (ref 0.0–5.0)
HCT: 39.8 % (ref 39.0–52.0)
Hemoglobin: 12.7 g/dL — ABNORMAL LOW (ref 13.0–17.0)
Lymphocytes Relative: 24 % (ref 12.0–46.0)
Lymphs Abs: 2.8 10*3/uL (ref 0.7–4.0)
MCHC: 32 g/dL (ref 30.0–36.0)
MCV: 85.5 fl (ref 78.0–100.0)
Monocytes Absolute: 0.5 10*3/uL (ref 0.1–1.0)
Monocytes Relative: 4.4 % (ref 3.0–12.0)
Neutro Abs: 8.1 10*3/uL — ABNORMAL HIGH (ref 1.4–7.7)
Neutrophils Relative %: 69.1 % (ref 43.0–77.0)
Platelets: 242 10*3/uL (ref 150.0–400.0)
RBC: 4.65 Mil/uL (ref 4.22–5.81)
RDW: 15.6 % — ABNORMAL HIGH (ref 11.5–15.5)
WBC: 11.7 10*3/uL — ABNORMAL HIGH (ref 4.0–10.5)

## 2018-10-17 LAB — BASIC METABOLIC PANEL
BUN: 22 mg/dL (ref 6–23)
CO2: 23 mEq/L (ref 19–32)
Calcium: 9.1 mg/dL (ref 8.4–10.5)
Chloride: 101 mEq/L (ref 96–112)
Creatinine, Ser: 1.85 mg/dL — ABNORMAL HIGH (ref 0.40–1.50)
GFR: 47.24 mL/min — ABNORMAL LOW (ref >60.00)
Glucose, Bld: 344 mg/dL — ABNORMAL HIGH (ref 70–99)
Potassium: 4.8 mEq/L (ref 3.5–5.1)
Sodium: 136 mEq/L (ref 135–145)

## 2018-10-17 LAB — LIPID PANEL
Cholesterol: 126 mg/dL (ref 0–200)
HDL: 49.9 mg/dL (ref >39.00)
LDL Cholesterol: 58 mg/dL (ref 0–99)
NonHDL: 76.33
Total CHOL/HDL Ratio: 3
Triglycerides: 90 mg/dL (ref 0.0–149.0)
VLDL: 18 mg/dL (ref 0.0–40.0)

## 2018-10-17 LAB — VITAMIN D 25 HYDROXY (VIT D DEFICIENCY, FRACTURES): VITD: 12.27 ng/mL — ABNORMAL LOW (ref 30.00–100.00)

## 2018-10-17 LAB — TSH: TSH: 2.25 u[IU]/mL (ref 0.35–4.50)

## 2018-10-17 LAB — HEMOGLOBIN A1C: Hgb A1c MFr Bld: 11.2 % — ABNORMAL HIGH (ref 4.6–6.5)

## 2018-10-17 LAB — PSA: PSA: 0.75 ng/mL (ref 0.10–4.00)

## 2018-10-17 MED ORDER — LISINOPRIL 20 MG PO TABS: 20.0000 mg | ORAL_TABLET | Freq: Every day | ORAL | 3 refills | Status: DC

## 2018-10-17 MED ORDER — ATORVASTATIN CALCIUM 20 MG PO TABS: 20.0000 mg | ORAL_TABLET | Freq: Every day | ORAL | 3 refills | Status: DC

## 2018-10-17 MED ORDER — CITALOPRAM HYDROBROMIDE 20 MG PO TABS: 20.0000 mg | ORAL_TABLET | Freq: Every day | ORAL | 3 refills | Status: DC

## 2018-10-17 MED ORDER — METFORMIN HCL 500 MG PO TABS: 500.0000 mg | ORAL_TABLET | Freq: Two times a day (BID) | ORAL | 3 refills | Status: DC

## 2018-10-17 MED ORDER — GABAPENTIN 300 MG PO CAPS: 300.0000 mg | ORAL_CAPSULE | Freq: Three times a day (TID) | ORAL | 3 refills | Status: DC

## 2018-10-17 MED ORDER — FERROUS SULFATE 325 (65 FE) MG PO TABS: 325.0000 mg | ORAL_TABLET | Freq: Every day | ORAL | 1 refills | Status: DC

## 2018-10-17 MED ORDER — METOPROLOL TARTRATE 25 MG PO TABS: 25.0000 mg | ORAL_TABLET | Freq: Two times a day (BID) | ORAL | 3 refills | Status: DC

## 2018-10-17 NOTE — Assessment & Plan Note (Signed)
Labs

## 2018-10-17 NOTE — Assessment & Plan Note (Signed)
lisinopril, metoprolol

## 2018-10-17 NOTE — Assessment & Plan Note (Signed)
F/u w/ Dr Sharol Given: L big toe removed Lmidfoot ulcers x2 R big toe and 3d toe removed R midfoot ulcer - shallow Ortho shoes Disabled since 2019

## 2018-10-17 NOTE — Patient Instructions (Signed)
These suggestions will probably help you to improve your metabolism if you are not overweight and to lose weight if you are overweight: 1.  Reduce your consumption of sugars and starches.  Eliminate high fructose corn syrup from your diet.  Reduce your consumption of processed foods.  For desserts try to have seasonal fruits, berries with with green, nuts, cheeses or dark chocolate with more than 70% cacao. 2.  Do not snack 3.  You do not have to eat breakfast.  If you choose to have breakfast-eat plain greek yogurt, eggs, oatmeal (without sugar) 4.  Drink water, freshly brewed unsweetened tea (green, black or herbal) or coffee.  Do not drink sodas including diet sodas , juices, beverages sweetened with artificial sweeteners. 5.  Reduce your consumption of refined grains. 6.  Avoid protein drinks such as Optifast, Slim fast etc. Eat chicken, fish, meat, dairy and beans for your sources of protein 7.  Natural unprocessed fats like cold pressed virgin olive oil, butter, coconut oil are good for you.  Eat avocados 8.  Increase your consumption of fiber.  Fruits, berries, vegetables, whole grains, flaxseeds, Chia seeds, beans, popcorn, nuts, oatmeal are good sources of fiber 9.  Use vinegar in your diet, i.e. apple cider vinegar, red wine or balsamic vinegar 10.  You can try fasting.  For example you can skip breakfast and lunch every other day (24-hour fast) 11.  Stress reduction, good night sleep, relaxation, meditation, yoga and other physical activity is likely to help you to maintain low weight too. 12.  If you drink alcohol, limit your alcohol intake to no more than 2 drinks a day.   Mediterranean diet is good for you.  The Mediterranean diet is a way of eating based on the traditional cuisine of countries bordering the [employer]. While there is no single definition of the Mediterranean diet, it is typically high in vegetables, fruits, whole grains, beans, nut and seeds, and olive  oil. The main components of Mediterranean diet include: Marland Kitchen Daily consumption of vegetables, fruits, whole grains and healthy fats  . Weekly intake of fish, poultry, beans and eggs  . Moderate portions of dairy products  . Limited intake of red meat Other important elements of the Mediterranean diet are sharing meals with family and friends, enjoying a glass of red wine and being physically active. Health benefits of a Mediterranean diet: A traditional Mediterranean diet consisting of large quantities of fresh fruits and vegetables, nuts, fish and olive oil-coupled with physical activity-can reduce your risk of serious mental and physical health problems by: Preventing heart disease and strokes. Following a Mediterranean diet limits your intake of refined breads, processed foods, and red meat, and encourages drinking red wine instead of hard liquor-all factors that can help prevent heart disease and stroke. Keeping you agile. If you're an older adult, the nutrients gained with a Mediterranean diet may reduce your risk of developing muscle weakness and other signs of frailty by about 70 percent. Reducing the risk of Alzheimer's. Research suggests that the [place] diet may improve cholesterol, blood sugar levels, and overall blood vessel health, which in turn may reduce your risk of Alzheimer's disease or dementia. Halving the risk of Parkinson's disease. The high levels of antioxidants in the Mediterranean diet can prevent cells from undergoing a damaging process called oxidative stress, thereby cutting the risk of Parkinson's disease in half. Increasing longevity. By reducing your risk of developing heart disease or cancer with the Mediterranean diet, you're reducing your risk  of death at any age by 20%. Protecting against type 2 diabetes. A Mediterranean diet is rich in fiber which digests slowly, prevents huge swings in blood sugar, and can help you maintain a healthy weight.    Cabbage soup  recipe that will not make you gain weight: Take 1 small head of CABG, 1 average pack of celery, 4 green peppers, 4 onions, 2 cans diced tomatoes (they are not available without salt), salt and spices to taste.  Chop cabbage, celery, peppers and onions.  And tomatoes and 2-2.5 liters (2.5 quarts) of water so that it would just cover the vegetables.  Bring to boil.  Add spices and salt.  Turn heat to low/medium and simmer for 20-25 minutes.  Naturally, you can make a smaller batch and change some of the ingredients.

## 2018-10-17 NOTE — Assessment & Plan Note (Signed)
Celexa

## 2018-10-17 NOTE — Assessment & Plan Note (Signed)
Labs Metformin 

## 2018-10-17 NOTE — Progress Notes (Signed)
Subjective:  Patient ID: [name], male    DOB: 12/03/1970  Age: 48 y.o. MRN: [id number]  CC: No chief complaint on file.   HPI [name] presents for DM, foot amputation, tobacco 1 ppd, HTN f/u  Outpatient Medications Prior to Visit  Medication Sig Dispense Refill  . aspirin EC 81 MG tablet Take 81 mg by mouth daily.    . blood glucose meter kit and supplies KIT Dispense based on patient and insurance preference. Use up to four times daily as directed. (FOR ICD-9 250.00, 250.01). 1 each 0  . ferrous sulfate 325 (65 FE) MG tablet Take 1 tablet (325 mg total) by mouth daily. 30 tablet 3  . gabapentin (NEURONTIN) 300 MG capsule Take 1 capsule (300 mg total) by mouth 3 (three) times daily. Patient needs office visit before refills will be given 90 capsule 0  . lisinopril (ZESTRIL) 20 MG tablet Take 1 tablet (20 mg total) by mouth daily. Patient needs office visit before refills will be given 30 tablet 0  . metFORMIN (GLUCOPHAGE) 500 MG tablet Take 1 tablet (500 mg total) by mouth 2 (two) times daily with a meal. Patient needs office visit before refills will be given 60 tablet 0  . mupirocin ointment (BACTROBAN) 2 % Apply 1 application topically 2 (two) times daily. 22 g 6  . atorvastatin (LIPITOR) 20 MG tablet Take 1 tablet (20 mg total) by mouth daily. 30 tablet 11  . citalopram (CELEXA) 20 MG tablet Take 1 tablet (20 mg total) by mouth daily. 30 tablet 11  . metoprolol tartrate (LOPRESSOR) 25 MG tablet Take 1 tablet (25 mg total) by mouth 2 (two) times daily. 60 tablet 11  . doxycycline (VIBRAMYCIN) 100 MG capsule Take 1 capsule (100 mg total) by mouth 2 (two) times daily. (Patient not taking: Reported on 10/17/2018) 20 capsule 0   No facility-administered medications prior to visit.     ROS: Review of Systems  Constitutional: Positive for fatigue and unexpected weight change. Negative for appetite change.  HENT: Negative for congestion, nosebleeds, sneezing, sore throat  and trouble swallowing.   Eyes: Negative for itching and visual disturbance.  Respiratory: Negative for cough.   Cardiovascular: Negative for chest pain, palpitations and leg swelling.  Gastrointestinal: Negative for abdominal distention, blood in stool, diarrhea and nausea.  Genitourinary: Negative for frequency and hematuria.  Musculoskeletal: Positive for arthralgias and gait problem. Negative for back pain, joint swelling and neck pain.  Skin: Positive for wound. Negative for rash.  Neurological: Positive for weakness and numbness. Negative for dizziness, tremors and speech difficulty.  Psychiatric/Behavioral: Negative for agitation, dysphoric mood and sleep disturbance. The patient is not nervous/anxious.     Objective:  BP 130/88 (BP Location: Left Arm, Patient Position: Sitting, Cuff Size: Large)   Pulse (!) 108   Temp 98.7 F (37.1 C) (Oral)   Ht '5\' 4"'$  (1.626 m)   Wt 199 lb (90.3 kg)   SpO2 99%   BMI 34.16 kg/m   BP Readings from Last 3 Encounters:  10/17/18 130/88  06/05/18 (!) 172/106  05/22/18 (!) 168/97    Wt Readings from Last 3 Encounters:  10/17/18 199 lb (90.3 kg)  10/02/18 180 lb (81.6 kg)  09/11/18 180 lb (81.6 kg)    Physical Exam Constitutional:      General: He is not in acute distress.    Appearance: He is well-developed.     Comments: NAD  Eyes:     Conjunctiva/sclera: Conjunctivae  normal.     Pupils: Pupils are equal, round, and reactive to light.  Neck:     Musculoskeletal: Normal range of motion.     Thyroid: No thyromegaly.     Vascular: No JVD.  Cardiovascular:     Rate and Rhythm: Normal rate and regular rhythm.     Heart sounds: Normal heart sounds. No murmur. No friction rub. No gallop.   Pulmonary:     Effort: Pulmonary effort is normal. No respiratory distress.     Breath sounds: Normal breath sounds. No wheezing or rales.  Chest:     Chest wall: No tenderness.  Abdominal:     General: Bowel sounds are normal. There is no  distension.     Palpations: Abdomen is soft. There is no mass.     Tenderness: There is no abdominal tenderness. There is no guarding or rebound.  Musculoskeletal: Normal range of motion.        General: No tenderness.  Lymphadenopathy:     Cervical: No cervical adenopathy.  Skin:    General: Skin is warm and dry.     Findings: No rash.  Neurological:     Mental Status: He is alert and oriented to person, place, and time.     Cranial Nerves: No cranial nerve deficit.     Motor: No abnormal muscle tone.     Coordination: Coordination abnormal.     Gait: Gait normal.     Deep Tendon Reflexes: Reflexes are normal and symmetric.  Psychiatric:        Behavior: Behavior normal.        Thought Content: Thought content normal.        Judgment: Judgment normal.   L big toe removed Lmidfoot ulcers x2 R big toe and 3d toe removed R midfoot ulcer - shallow  Lab Results  Component Value Date   WBC 10.7 (H) 06/05/2018   HGB 12.2 (L) 06/05/2018   HCT 40.8 06/05/2018   PLT 272 06/05/2018   GLUCOSE 173 (H) 06/05/2018   CHOL 93 06/01/2017   TRIG 51.0 06/01/2017   HDL 39.70 06/01/2017   LDLCALC 43 06/01/2017   ALT 14 06/05/2018   AST 15 06/05/2018   NA 139 06/05/2018   K 4.5 06/05/2018   CL 107 06/05/2018   CREATININE 1.49 (H) 06/05/2018   BUN 22 (H) 06/05/2018   CO2 27 06/05/2018   TSH 1.97 06/01/2017   PSA 0.64 06/01/2017   INR 1.1 (H) 07/26/2013   HGBA1C 7.9 (H) 05/21/2018   MICROALBUR 92.0 (H) 06/01/2017    No results found.  Assessment & Plan:   There are no diagnoses linked to this encounter.   No orders of the defined types were placed in this encounter.    Follow-up: No follow-ups on file.  Walker Kehr, MD

## 2018-10-18 ENCOUNTER — Other Ambulatory Visit: Payer: Self-pay | Admitting: Internal Medicine

## 2018-10-18 DIAGNOSIS — E113593 Type 2 diabetes mellitus with proliferative diabetic retinopathy without macular edema, bilateral: Secondary | ICD-10-CM

## 2018-10-18 MED ORDER — B COMPLEX PO TABS: 1.0000 | ORAL_TABLET | Freq: Every day | ORAL | 3 refills | Status: DC

## 2018-10-18 MED ORDER — VITAMIN D3 1.25 MG (50000 UT) PO CAPS: 1.0000 | ORAL_CAPSULE | ORAL | 0 refills | Status: DC

## 2018-10-18 MED ORDER — REPAGLINIDE 1 MG PO TABS: 1.0000 mg | ORAL_TABLET | Freq: Three times a day (TID) | ORAL | 3 refills | Status: DC

## 2018-10-18 MED ORDER — VITAMIN D3 50 MCG (2000 UT) PO CAPS: 2000.0000 [IU] | ORAL_CAPSULE | Freq: Every day | ORAL | 3 refills | Status: DC

## 2018-10-26 ENCOUNTER — Telehealth: Payer: Self-pay | Admitting: Internal Medicine

## 2018-10-26 NOTE — Telephone Encounter (Signed)
Pt states that he went to pick upmetFORMIN (GLUCOPHAGE) 500 MG tabletlisinopril (ZESTRIL) 20 MG tablet  ferrous sulfate 325 (65 FE) MG tablet gabapentin (NEURONTIN) 300 MG capsule  He can't afford these meds and the pharmacy told him to talk to the dr about some other options, dosages , generic etc. pls fu with pt at [phone]

## 2018-10-26 NOTE — Telephone Encounter (Signed)
I do not understand.  Very all were supposed to be generic. Thanks

## 2018-10-27 NOTE — Telephone Encounter (Signed)
Left detailed message informing pt I placed a GoodRx card upfront for him.

## 2018-10-27 NOTE — Telephone Encounter (Signed)
Left message for patient to call back  

## 2018-10-30 ENCOUNTER — Other Ambulatory Visit: Payer: Self-pay

## 2018-10-30 ENCOUNTER — Ambulatory Visit (INDEPENDENT_AMBULATORY_CARE_PROVIDER_SITE_OTHER): Payer: BLUE CROSS/BLUE SHIELD | Admitting: Orthopedic Surgery

## 2018-10-30 ENCOUNTER — Encounter: Payer: Self-pay | Admitting: Orthopedic Surgery

## 2018-10-30 VITALS — Ht 64.0 in | Wt 199.0 lb

## 2018-10-30 DIAGNOSIS — E08621 Diabetes mellitus due to underlying condition with foot ulcer: Secondary | ICD-10-CM | POA: Diagnosis not present

## 2018-10-30 DIAGNOSIS — L97511 Non-pressure chronic ulcer of other part of right foot limited to breakdown of skin: Secondary | ICD-10-CM | POA: Diagnosis not present

## 2018-10-30 DIAGNOSIS — L97422 Non-pressure chronic ulcer of left heel and midfoot with fat layer exposed: Secondary | ICD-10-CM | POA: Diagnosis not present

## 2018-10-31 ENCOUNTER — Encounter: Payer: Self-pay | Admitting: Orthopedic Surgery

## 2018-10-31 NOTE — Progress Notes (Signed)
Office Visit Note   Patient: [name]           Date of Birth: 04-27-1970           MRN: [id number] Visit Date: 10/30/2018              Requested by: Cassandria Anger, MD [place],  [place] [address] PCP: Cassandria Anger, MD  Chief Complaint  Patient presents with  . Right Foot - Wound Check  . 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  Patient denies any drainage or cellulitis.  He states he has been using Bactroban dressing changes.   Assessment & Plan: Visit Diagnoses:  1. Diabetic ulcer of left midfoot associated with diabetes mellitus due to underlying condition, with fat layer exposed ([place])   2. Non-pressure chronic ulcer of other part of right foot limited to breakdown of skin ([place])     Plan: Ulcers were debrided x3.  Patient will continue with Bactroban ointment dressing changes for the wounds.  Follow-Up Instructions: No follow-ups on file.   Ortho Exam  Patient is alert, oriented, no adenopathy, well-dressed, normal affect, normal respiratory effort. Examination patient ambulates with a double upright brace on the left.  He has 3 large ulcers on the plantar aspect of both feet.  After informed consent a 10 blade knife was used to debride the ulcer on the left foot this is 6 cm in diameter 5 mm deep with healthy tissue at the base of the wound.  A Band-Aid was applied.  Right foot has 2 ulcers one is 3 cm in diameter the other is 4 cm in diameter and these were both debrided of skin and soft tissue with a 10 blade knife these were each 5 mm deep.  Band-Aids were applied.  There is no exposed bone or tendon good healthy granulation tissue at the base silver nitrate was used for hemostasis.  Hemoglobin A1c most recently 11.2 with poor control of diabetes.  Imaging: No results found. No images are attached to the encounter.  Labs:  Lab Results  Component Value Date   HGBA1C 11.2 (H) 10/17/2018   HGBA1C 7.9 (H) 05/21/2018   HGBA1C 11.3 (H) 06/01/2017   ESRSEDRATE 54 (H) 08/20/2016   ESRSEDRATE 8 07/24/2012   ESRSEDRATE 4 04/19/2008   CRP 2.2 (H) 08/20/2016   REPTSTATUS 05/26/2018 FINAL 05/21/2018   GRAMSTAIN  07/24/2012    CYTOSPIN NO WBC SEEN NO ORGANISMS SEEN Gram Stain Report Called to,Read Back By and Verified With: [id number]   CULT NO GROWTH 5 DAYS 05/21/2018     Lab Results  Component Value Date   ALBUMIN 3.9 10/17/2018   ALBUMIN 3.6 06/05/2018   ALBUMIN 3.2 (L) 06/01/2017   PREALBUMIN 18.9 05/21/2018    No results found for: MG Lab Results  Component Value Date   VD25OH 12.27 (L) 10/17/2018   VD25OH 2.63 (L) 06/01/2017    Lab Results  Component Value Date   PREALBUMIN 18.9 05/21/2018   CBC EXTENDED Latest Ref Rng & Units 10/17/2018 06/05/2018 05/22/2018  WBC 4.0 - 10.5 K/uL 11.7(H) 10.7(H) 11.4(H)  RBC 4.22 - 5.81 Mil/uL 4.65 4.49 3.83(L)  HGB 13.0 - 17.0 g/dL 12.7(L) 12.2(L) 9.9(L)  HCT 39.0 - 52.0 % 39.8 40.8 33.3(L)  PLT 150.0 - 400.0 K/uL 242.0 272 264  NEUTROABS 1.4 - 7.7 K/uL 8.1(H) - -  LYMPHSABS 0.7 - 4.0 K/uL 2.8 - -     Body mass index is 34.16 kg/m.  Orders:  No orders of the defined types were placed in this encounter.  No orders of the defined types were placed in this encounter.    Procedures: No procedures performed  Clinical Data: No additional findings.  ROS:  All other systems negative, except as noted in the HPI. Review of Systems  Objective: Vital Signs: Ht 5\' 4"  (1.626 m)   Wt 199 lb (90.3 kg)   BMI 34.16 kg/m   Specialty Comments:  No specialty comments available.  PMFS History: Patient Active Problem List   Diagnosis Date Noted  . Diabetic foot ([facility]) 10/17/2018  . Non-pressure chronic ulcer of other part of right foot limited to breakdown of skin ([place]) 09/11/2018  . 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 Type 2 diabetes mellitus with hyperglycemia, without long-term current use of insulin ([place]) 07/14/2018  . Wound cellulitis 05/22/2018  . Osteomyelitis of foot, acute ([place]) 05/21/2018  . 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 ([place])   . Diabetic ulcer of left midfoot associated with diabetes mellitus due to underlying condition, with fat layer exposed ([place]) 08/26/2017  . B12 deficiency 06/15/2017  . Vitamin D deficiency 06/03/2017  . Iron deficiency anemia 06/01/2017  . Diabetic foot infection ([place])   . Osteomyelitis ([place]) 05/25/2017  . Vitreous floaters of right eye 09/15/2016  . Non compliance w medication regimen 09/15/2016  . Depression 09/01/2016  . Foot ulcer due to secondary DM ([place]) 08/20/2016  . Hidradenitis suppurativa 10/13/2015  . Peripheral polyneuropathy 07/07/2015  . Coronary atherosclerosis of native coronary artery 08/17/2013  . Diabetic retinopathy ([place]) 02/02/2013  . Diabetic neuropathy ([place]) 12/01/2012  . Hypertension associated with diabetes ([place]) 08/03/2012  . Tobacco use disorder 08/03/2012  . Erectile dysfunction 08/03/2012  . Diabetes mellitus with neurological manifestations, uncontrolled ([place]) 05/26/2006   Past Medical History:  Diagnosis Date  . Diabetes mellitus type II, uncontrolled ([place])   . Hyperlipidemia   . Hypertension   . Other and unspecified angina pectoris   . Tobacco use     Family History  Problem Relation Age of Onset  . Cancer Mother   . Diabetes Father   . Hypertension Father     Past Surgical History:  Procedure Laterality Date  . AMPUTATION Left 08/22/2016   Procedure: GREAT TOE AMPUTATION;  Surgeon: [name], MD;  Location: [facility];  Service: Orthopedics;  Laterality: Left;  . AMPUTATION Right 05/28/2017   Procedure: AMPUTATION 1st & 3rd TOE;  Surgeon: [name], MD;  Location: [facility];  Service: Orthopedics;   Laterality: Right;  . EYE SURGERY    . LEFT HEART CATHETERIZATION WITH CORONARY ANGIOGRAM N/A 07/31/2013   Procedure: LEFT HEART CATHETERIZATION WITH CORONARY ANGIOGRAM;  Surgeon: Jettie Booze, MD;  Location: [facility] CATH LAB;  Service: Cardiovascular;  Laterality: N/A;  . spinal tap    . toe amputated     Social History   Occupational History  . Occupation: Leisure centre manager: [employer]  Tobacco Use  . Smoking status: Current Every Day Smoker    Packs/day: 1.00    Years: 20.00    Pack years: 20.00    Types: Cigarettes  . Smokeless tobacco: Never Used  . Tobacco comment: wants patches   Substance and Sexual Activity  . Alcohol  use: Not Currently    Comment: occ  . Drug use: No  . Sexual activity: Not Currently

## 2018-11-09 ENCOUNTER — Other Ambulatory Visit: Payer: Self-pay

## 2018-11-10 ENCOUNTER — Ambulatory Visit (INDEPENDENT_AMBULATORY_CARE_PROVIDER_SITE_OTHER): Payer: BLUE CROSS/BLUE SHIELD | Admitting: Internal Medicine

## 2018-11-10 VITALS — BP 162/98 | HR 104 | Temp 98.6°F | Ht 64.0 in | Wt 197.0 lb

## 2018-11-10 DIAGNOSIS — E113593 Type 2 diabetes mellitus with proliferative diabetic retinopathy without macular edema, bilateral: Secondary | ICD-10-CM

## 2018-11-10 DIAGNOSIS — S98139A Complete traumatic amputation of one unspecified lesser toe, initial encounter: Secondary | ICD-10-CM | POA: Diagnosis not present

## 2018-11-10 LAB — GLUCOSE, POCT (MANUAL RESULT ENTRY): POC Glucose: 292 mg/dl — AB (ref 70–99)

## 2018-11-10 MED ORDER — GLIPIZIDE 5 MG PO TABS: 5.0000 mg | ORAL_TABLET | Freq: Two times a day (BID) | ORAL | 3 refills | Status: DC

## 2018-11-10 NOTE — Progress Notes (Signed)
Name: [name]  Age/ Sex: 48 y.o., male   MRN/ DOB: [id number], Apr 25, 1970     PCP: Cassandria Anger, MD   Reason for Endocrinology Evaluation: Type 2 Diabetes Mellitus     Initial Endocrinology Clinic Visit: 07/14/2018    PATIENT IDENTIFIER: [name] is a 48 y.o. male with a past medical history of T2DM, and HTN, Hx toe amputations Bilaterally  The patient has followed with Endocrinology clinic since 07/14/2018 for consultative assistance with management of his diabetes.  DIABETIC HISTORY:  Mr. Corter was diagnosed with T2DM over 20 years ago. He has been on Metformin since diagnosis, he also has been on glipizide. He was on insulin until sometime in 2019. His hemoglobin A1c has ranged from 7.0% in 2014, peaking at 11.3% in 2019.   SUBJECTIVE:   During the last visit (08/11/2018): Continued Metformin as he stated his sugars were no higher then 140 mg/dL.   Today (11/10/2018): [name] is here for a 3 month  follow up on diabetes management.  He checks his blood sugars 2 times daily, preprandial to breakfast . The patient has not had hypoglycemic episodes since the last clinic visit.Otherwise, the patient has not required any recent emergency interventions for hypoglycemia and has not had recent hospitalizations secondary to hyper or hypoglycemic episodes.     ROS: As per HPI and as detailed below: Review of Systems  Constitutional: Negative for fever.  HENT: Negative for congestion and sore throat.   Respiratory: Negative for cough and shortness of breath.   Cardiovascular: Negative for chest pain and palpitations.  Gastrointestinal: Negative for diarrhea and nausea.  Genitourinary: Negative for frequency.  Endo/Heme/Allergies: Negative for polydipsia.      HOME DIABETES REGIMEN:  Metformin 500 mg XR 2 tabs BID     GLUCOSE LOG : Did not bring      HISTORY:  Past Medical History:  Past Medical History:  Diagnosis Date  . Diabetes  mellitus type II, uncontrolled ([place])   . Hyperlipidemia   . Hypertension   . Other and unspecified angina pectoris   . Tobacco use    Past Surgical History:  Past Surgical History:  Procedure Laterality Date  . AMPUTATION Left 08/22/2016   Procedure: GREAT TOE AMPUTATION;  Surgeon: [name], MD;  Location: [facility];  Service: Orthopedics;  Laterality: Left;  . AMPUTATION Right 05/28/2017   Procedure: AMPUTATION 1st & 3rd TOE;  Surgeon: [name], MD;  Location: [facility];  Service: Orthopedics;  Laterality: Right;  . EYE SURGERY    . LEFT HEART CATHETERIZATION WITH CORONARY ANGIOGRAM N/A 07/31/2013   Procedure: LEFT HEART CATHETERIZATION WITH CORONARY ANGIOGRAM;  Surgeon: Jettie Booze, MD;  Location: [facility] CATH LAB;  Service: Cardiovascular;  Laterality: N/A;  . spinal tap    . toe amputated      Social History:  reports that he has been smoking cigarettes. He has a 20.00 pack-year smoking history. He has never used smokeless tobacco. He reports previous alcohol use. He reports that he does not use drugs. Family History:  Family History  Problem Relation Age of Onset  . Cancer Mother   . Diabetes Father   . Hypertension Father      HOME MEDICATIONS: Allergies as of 11/10/2018      Reactions   Bee Venom Swelling   SWELLING REACTION UNSPECIFIED    Penicillins Hives, Other (See Comments)   PATIENT HAS HAD A PCN REACTION WITH IMMEDIATE  RASH, FACIAL/TONGUE/THROAT SWELLING, SOB, OR LIGHTHEADEDNESS WITH HYPOTENSION:  #  #  #  YES  #  #  #   Has patient had a PCN reaction causing severe rash involving mucus membranes or skin necrosis: No Has patient had a PCN reaction that required hospitalization: No Has patient had a PCN reaction occurring within the last 10 years: No Allergy Severity noted prior to 05/28/17      Medication List       Accurate as of November 10, 2018  2:14 PM. If you have any questions, ask your nurse or doctor.        aspirin EC 81 MG tablet Take 81 mg by  mouth daily.   atorvastatin 20 MG tablet Commonly known as: LIPITOR Take 1 tablet (20 mg total) by mouth daily.   b complex vitamins tablet Take 1 tablet by mouth daily.   blood glucose meter kit and supplies Kit Dispense based on patient and insurance preference. Use up to four times daily as directed. (FOR ICD-9 250.00, 250.01).   citalopram 20 MG tablet Commonly known as: CeleXA Take 1 tablet (20 mg total) by mouth daily.   ferrous sulfate 325 (65 FE) MG tablet Take 1 tablet (325 mg total) by mouth daily.   gabapentin 300 MG capsule Commonly known as: Neurontin Take 1 capsule (300 mg total) by mouth 3 (three) times daily. Patient needs office visit before refills will be given   lisinopril 20 MG tablet Commonly known as: ZESTRIL Take 1 tablet (20 mg total) by mouth daily.   metFORMIN 500 MG tablet Commonly known as: GLUCOPHAGE Take 1 tablet (500 mg total) by mouth 2 (two) times daily with a meal.   metoprolol tartrate 25 MG tablet Commonly known as: LOPRESSOR Take 1 tablet (25 mg total) by mouth 2 (two) times daily.   mupirocin ointment 2 % Commonly known as: Bactroban Apply 1 application topically 2 (two) times daily.   repaglinide 1 MG tablet Commonly known as: Prandin Take 1 tablet (1 mg total) by mouth 3 (three) times daily before meals.   Vitamin D3 1.25 MG (50000 UT) Caps Take 1 capsule by mouth once a week.   Vitamin D3 50 MCG (2000 UT) capsule Take 1 capsule (2,000 Units total) by mouth daily.      OBJECTIVE:    PHYSICAL EXAM: VS: BP (!) 162/98 (BP Location: Left Arm, Patient Position: Sitting, Cuff Size: Normal)   Pulse (!) 104   Temp 98.6 F (37 C)   Ht '5\' 4"'$  (1.626 m)   Wt 197 lb (89.4 kg)   SpO2 98%   BMI 33.81 kg/m    EXAM: General: Pt appears well and is in NAD  Hydration: Well-hydrated with with moist mucous membranes and good skin turgor  Eyes: External eye exam normal without stare, lid lag or exophthalmos.  EOM intact.   Neck:  General: Supple without adenopathy. Thyroid: Thyroid size normal.  No goiter or nodules appreciated. No thyroid bruit.  Lungs: Clear with good BS bilat with no rales, rhonchi, or wheezes  Heart: Auscultation: RRR with normal S1 and S2, no gallops or murmurs Carotid arteries: no bruits Periph. circulation: no peripheral edema  Abdomen: Normoactive bowel sounds, soft, nontender, without masses or organomegaly palpable  Lymphatics:   Extremities:  BL LE: no pretibial edema normal ROM and strength, no joint enlargement or tenderness  Skin: Hair: texture and amount normal with gender appropriate distribution Skin Inspection: no rashes Skin Palpation: skin temperature, texture, and thickness  normal to palpation  Neuro: Cranial nerves: II - XII grossly intact ;  Motor: normal strength throughout  Mental Status: Judgment, insight: intact Orientation: oriented to time, place, and person Mood and affect: no depression, anxiety, or agitation   Diabetic Foot Exam 11/10/2018  Right 1st and 3rd toe amputations, Left 1st toe amputation Left plantar open wound under, Right plantar callous formation  The pedal pulses are undetectable  The sensation is decreased to a screening 5.07, 10 gram monofilament bilaterally  DATA REVIEWED:  Lab Results  Component Value Date   HGBA1C 11.2 (H) 10/17/2018   HGBA1C 7.9 (H) 05/21/2018   HGBA1C 11.3 (H) 06/01/2017   Lab Results  Component Value Date   MICROALBUR 92.0 (H) 06/01/2017   LDLCALC 58 10/17/2018   CREATININE 1.85 (H) 10/17/2018   Lab Results  Component Value Date   MICRALBCREAT 50.3 (H) 06/01/2017     Lab Results  Component Value Date   CHOL 126 10/17/2018   HDL 49.90 10/17/2018   LDLCALC 58 10/17/2018   TRIG 90.0 10/17/2018   CHOLHDL 3 10/17/2018         ASSESSMENT / PLAN / RECOMMENDATIONS:   1) Type 2 Diabetes Mellitus, Poorly controlled, With Neuropathic complications, retinopathy and B/L toe amputations - Most recent A1c of 11.2  %. Goal A1c <  7.0 %.   - Pt claims to checking his glucose twice a day with BG's in the 120's mg/dL. I explained to him that his A1c tells a different story, unfortunately moving forward will have to depend on in-office BG readings and his A1C in making treatment decision.  - Discussed the importance of lifestyle changes in diabetes control - I have discussed with the patient the pathophysiology of diabetes. We went over the natural progression of the disease. We talked about both insulin resistance and insulin deficiency. We stressed the importance of lifestyle changes including diet and exercise. I explained the complications associated with diabetes including retinopathy, nephropathy, neuropathy as well as increased risk of cardiovascular disease. We went over the benefit seen with glycemic control.   I explained to the patient that diabetic patients are at higher than normal risk for amputations.  - I have advised him that with an A1c of 11.2% , insulin is recommended but he declined and would like to try Glipizide instead.   MEDICATIONS:  Continue Metformin 500 mg XR 2 tab with Breakfast and 1 tablet with Supper   Glipizide 5 mg, 1 tablet with Breakfast and 1 tablet with Supper   EDUCATION / INSTRUCTIONS:  BG monitoring instructions: Patient is instructed to check his blood sugars 2 times a day, fasting and bedtime.  Call [facility] if: BG persistently < 70 or > 300. . I reviewed the Rule of 15 for the treatment of hypoglycemia in detail with the patient. Literature supplied.     F/U in 3 months    Signed electronically by: Mack Guise, MD  [facility] Endocrinology  [facility] [address] [place], [place] [address] Phone: [phone] FAX: [phone]   CC: Cassandria Anger, MD [place] [address] Phone: [phone]  Fax: [phone]  Return to Endocrinology clinic as below: Future  Appointments  Date Time Provider [place]  11/27/2018  9:30 AM [name], MD [facility] None  01/17/2019  2:00 PM Plotnikov, Evie Lacks, MD [id number]

## 2018-11-10 NOTE — Patient Instructions (Addendum)
-   Continue Metformin 500 mg Twice a day  - Start Glipizide 5 mg, 1 tablet Before Breakfast and Before Supper    - Check Sugar Before breakfast and Supper    - HOW TO TREAT LOW BLOOD SUGARS (Blood sugar LESS THAN 70 MG/DL)  Please follow the RULE OF 15 for the treatment of hypoglycemia treatment (when your (blood sugars are less than 70 mg/dL)    STEP 1: Take 15 grams of carbohydrates when your blood sugar is low, which includes:   3-4 GLUCOSE TABS  OR  3-4 OZ OF JUICE OR REGULAR SODA OR  ONE TUBE OF GLUCOSE GEL     STEP 2: RECHECK blood sugar in 15 MINUTES STEP 3: If your blood sugar is still low at the 15 minute recheck --> then, go back to STEP 1 and treat AGAIN with another 15 grams of carbohydrates.

## 2018-11-13 ENCOUNTER — Encounter: Payer: Self-pay | Admitting: Internal Medicine

## 2018-11-13 DIAGNOSIS — S98139A Complete traumatic amputation of one unspecified lesser toe, initial encounter: Secondary | ICD-10-CM | POA: Insufficient documentation

## 2018-11-27 ENCOUNTER — Encounter: Payer: Self-pay | Admitting: Orthopedic Surgery

## 2018-11-27 ENCOUNTER — Ambulatory Visit (INDEPENDENT_AMBULATORY_CARE_PROVIDER_SITE_OTHER): Payer: Self-pay | Admitting: Orthopedic Surgery

## 2018-11-27 VITALS — Ht 64.0 in | Wt 197.0 lb

## 2018-11-27 DIAGNOSIS — L97511 Non-pressure chronic ulcer of other part of right foot limited to breakdown of skin: Secondary | ICD-10-CM

## 2018-11-27 DIAGNOSIS — E08621 Diabetes mellitus due to underlying condition with foot ulcer: Secondary | ICD-10-CM

## 2018-11-27 DIAGNOSIS — E11610 Type 2 diabetes mellitus with diabetic neuropathic arthropathy: Secondary | ICD-10-CM

## 2018-11-27 DIAGNOSIS — L97422 Non-pressure chronic ulcer of left heel and midfoot with fat layer exposed: Secondary | ICD-10-CM

## 2018-11-27 NOTE — Progress Notes (Signed)
Office Visit Note   Patient: [name]           Date of Birth: 1971-03-03           MRN: [id number] Visit Date: 11/27/2018              Requested by: Cassandria Anger, MD [place],   [id number] PCP: Cassandria Anger, MD  Chief Complaint  Patient presents with  . Left Foot - Follow-up  . 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  Patient has a double upright brace on the left extra-depth shoes custom orthotics bilaterally.  Assessment & Plan: Visit Diagnoses:  1. Diabetic ulcer of left midfoot associated with diabetes mellitus due to underlying condition, with fat layer exposed ([place])   2. 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 ([place])   4. Diabetic neurogenic arthropathy ([place])     Plan: The ulcers were debrided x2 continue with protective shoe wear and orthotics reevaluate in 4 weeks.  Follow-Up Instructions: Return in about 4 weeks (around 12/25/2018).   Ortho Exam  Patient is alert, oriented, no adenopathy, well-dressed, normal affect, normal respiratory effort. Examination patient has increasing size of the ulcers on both feet with hypertrophic callus.  There is no ascending cellulitis no drainage no signs of infection.  After informed consent the ulcer on the left foot was debrided of skin and soft tissue back to healthy viable granulation tissue this was touched with silver nitrate the ulcer is 5 cm in diameter 5 mm deep a Band-Aid was applied.  Semination the right foot he has a hypertrophic callus over the base of the fifth metatarsal this was pared this is 2 cm in diameter no open ulcer.  Patient does have a Waggoner grade 1 ulcer over the forefoot.  After informed consent this was  debrided of skin and soft tissue back to healthy viable granulation tissue this was 3 cm in diameter on the right foot 5 mm deep a Band-Aid was also applied.  There is no exposed bone or tendon on either ulcer no drainage no signs of infection.  Imaging: No results found. No images are attached to the encounter.  Labs: Lab Results  Component Value Date   HGBA1C 11.2 (H) 10/17/2018   HGBA1C 7.9 (H) 05/21/2018   HGBA1C 11.3 (H) 06/01/2017   ESRSEDRATE 54 (H) 08/20/2016   ESRSEDRATE 8 07/24/2012   ESRSEDRATE 4 04/19/2008   CRP 2.2 (H) 08/20/2016   REPTSTATUS 05/26/2018 FINAL 05/21/2018   GRAMSTAIN  07/24/2012    CYTOSPIN NO WBC SEEN NO ORGANISMS SEEN Gram Stain Report Called to,Read Back By and Verified With: R.[id number]/[email or web address]   CULT NO GROWTH 5 DAYS 05/21/2018     Lab Results  Component Value Date   ALBUMIN 3.9 10/17/2018   ALBUMIN 3.6 06/05/2018   ALBUMIN 3.2 (L) 06/01/2017   PREALBUMIN 18.9 05/21/2018    No results found for: MG Lab Results  Component Value Date   VD25OH 12.27 (L) 10/17/2018   VD25OH 2.63 (L) 06/01/2017    Lab Results  Component Value Date   PREALBUMIN 18.9 05/21/2018   CBC EXTENDED Latest Ref Rng & Units 10/17/2018 06/05/2018 05/22/2018  WBC 4.0 - 10.5 K/uL 11.7(H) 10.7(H)  11.4(H)  RBC 4.22 - 5.81 Mil/uL 4.65 4.49 3.83(L)  HGB 13.0 - 17.0 g/dL 12.7(L) 12.2(L) 9.9(L)  HCT 39.0 - 52.0 % 39.8 40.8 33.3(L)  PLT 150.0 - 400.0 K/uL 242.0 272 264  NEUTROABS 1.4 - 7.7 K/uL 8.1(H) - -  LYMPHSABS 0.7 - 4.0 K/uL 2.8 - -     Body mass index is 33.81 kg/m.  Orders:  No orders of the defined types were placed in this encounter.  No orders of the defined types were placed in this encounter.    Procedures: No procedures performed  Clinical Data: No additional findings.  ROS:  All other systems negative, except as noted in the HPI. Review of Systems  Objective: Vital Signs: Ht 5\' 4"  (1.626 m)   Wt 197 lb (89.4 kg)   BMI 33.81  kg/m   Specialty Comments:  No specialty comments available.  PMFS History: Patient Active Problem List   Diagnosis Date Noted  . 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[place]) 11/13/2018  . Diabetic foot ([place]) 10/17/2018  . Non-pressure chronic ulcer of other part of right foot limited to breakdown of skin ([place]) 09/11/2018  . Type 2 diabetes mellitus with proliferative retinopathy of both eyes, without long-term current use of insulin ([place]) 07/14/2018  . Type 2 diabetes mellitus with hyperglycemia, without long-term current use of insulin ([place]) 07/14/2018  . Wound cellulitis 05/22/2018  . Osteomyelitis of foot, acute ([place]) 05/21/2018  . 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 Diabetic ulcer of left midfoot associated with diabetes mellitus due to underlying condition, with fat layer exposed ([place]) 08/26/2017  . B12 deficiency 06/15/2017  . Vitamin D deficiency 06/03/2017  . Iron deficiency anemia 06/01/2017  . Diabetic foot infection ([name])   . Osteomyelitis ([place]) 05/25/2017  . Vitreous floaters of right eye 09/15/2016  . Non compliance w medication regimen 09/15/2016  . Depression 09/01/2016  . Foot ulcer due to secondary DM ([place]) 08/20/2016  . Hidradenitis suppurativa 10/13/2015  . Peripheral polyneuropathy 07/07/2015  . Coronary atherosclerosis of native coronary artery 08/17/2013  . Diabetic retinopathy ([place]) 02/02/2013  . Diabetic neuropathy ([place]) 12/01/2012  . Hypertension associated with diabetes ([place]) 08/03/2012  . Tobacco use disorder 08/03/2012  . Erectile dysfunction 08/03/2012  . Diabetes mellitus with neurological manifestations, uncontrolled ([place]) 05/26/2006   Past Medical History:  Diagnosis Date  . Diabetes mellitus type II, uncontrolled ([place])   . Hyperlipidemia   . Hypertension   . Other and unspecified angina pectoris   . Tobacco use     Family History  Problem Relation Age of Onset  . Cancer Mother   . Diabetes Father   . Hypertension  Father     Past Surgical History:  Procedure Laterality Date  . AMPUTATION Left 08/22/2016   Procedure: GREAT TOE AMPUTATION;  Surgeon: [name], MD;  Location: [facility];  Service: Orthopedics;  Laterality: Left;  . AMPUTATION Right 05/28/2017   Procedure: AMPUTATION 1st & 3rd TOE;  Surgeon: [name], MD;  Location: [facility];  Service: Orthopedics;  Laterality: Right;  . EYE SURGERY    . LEFT HEART CATHETERIZATION WITH CORONARY ANGIOGRAM N/A 07/31/2013   Procedure: LEFT HEART CATHETERIZATION WITH CORONARY ANGIOGRAM;  Surgeon: Jettie Booze, MD;  Location: [facility];  Service: Cardiovascular;  Laterality: N/A;  . spinal tap    . toe amputated     Social History   Occupational History  . Occupation: Leisure centre manager: [employer]  Tobacco Use  . Smoking status: Current Every Day Smoker    Packs/day: 1.00    Years: 20.00    Pack years: 20.00    Types: Cigarettes  . Smokeless tobacco: Never Used  . Tobacco comment: wants patches   Substance and Sexual Activity  . Alcohol use: Not Currently    Comment: occ  . Drug use: No  . Sexual activity: Not Currently

## 2018-12-25 ENCOUNTER — Ambulatory Visit (INDEPENDENT_AMBULATORY_CARE_PROVIDER_SITE_OTHER): Payer: Medicaid Other | Admitting: Orthopedic Surgery

## 2018-12-25 ENCOUNTER — Other Ambulatory Visit: Payer: Self-pay

## 2018-12-25 ENCOUNTER — Encounter: Payer: Self-pay | Admitting: Orthopedic Surgery

## 2018-12-25 VITALS — Ht 64.0 in | Wt 197.0 lb

## 2018-12-25 DIAGNOSIS — L97511 Non-pressure chronic ulcer of other part of right foot limited to breakdown of skin: Secondary | ICD-10-CM | POA: Diagnosis not present

## 2018-12-25 DIAGNOSIS — E08621 Diabetes mellitus due to underlying condition with foot ulcer: Secondary | ICD-10-CM | POA: Diagnosis not present

## 2018-12-25 DIAGNOSIS — L97422 Non-pressure chronic ulcer of left heel and midfoot with fat layer exposed: Secondary | ICD-10-CM | POA: Diagnosis not present

## 2018-12-25 NOTE — Progress Notes (Signed)
Office Visit Note   Patient: [name]           Date of Birth: [birth year]           MRN: [id number] Visit Date: 12/25/2018              Requested by: Cassandria Anger, MD [place],  [place] [id number] PCP: Cassandria Anger, MD  Chief Complaint  Patient presents with  . Right Foot - Follow-up  . Left Foot - Follow-up      HPI: Patient is a 48 year old gentleman status post right foot partial foot amputation with bilateral Wegner grade 1 ulcers on both feet.  Patient is wearing a double upright brace on the left custom orthotics bilaterally extra-depth shoes bilaterally.  Assessment & Plan: Visit Diagnoses:  1. Diabetic ulcer of left midfoot associated with diabetes mellitus due to underlying condition, with fat layer exposed ([place])   2. Non-pressure chronic ulcer of other part of right foot limited to breakdown of skin ([place])     Plan: Patient's wounds appeared healthy after debridement.  He will continue with protective shoe wear minimize weightbearing.  Recommend that he could increase his Neurontin to 100 mg 3 times a day to see if this would help with his neuropathic pain.  Follow-Up Instructions: Return in about 4 weeks (around 01/22/2019).   Ortho Exam  Patient is alert, oriented, no adenopathy, well-dressed, normal affect, normal respiratory effort. Examination patient's feet are plantigrade bilaterally.  There is no redness no cellulitis no signs of infection.  Patient has large insensate neuropathic Wegner grade 1 ulcers on the plantar aspect of both feet.  After informed consent a 10 blade knife was used to debride the ulcer and callus from both feet.  The right foot ulcer is 3 cm in diameter 5 mm deep this was touched with silver nitrate inside healthy granulation tissue no exposed bone or tendon.  The left foot ulcer was debrided with a 10 blade knife the skin and soft tissue back to healthy viable granulation tissue this measured 4 x 6 cm after  debridement and 5 mm deep.  This was also touched with silver nitrate for hemostasis there was good healthy granulation tissue with no exposed bone or tendon no drainage.  Imaging: No results found. No images are attached to the encounter.  Labs: Lab Results  Component Value Date   HGBA1C 11.2 (H) 10/17/2018   HGBA1C 7.9 (H) 05/21/2018   HGBA1C 11.3 (H) 06/01/2017   ESRSEDRATE 54 (H) 08/20/2016   ESRSEDRATE 8 07/24/2012   ESRSEDRATE 4 04/19/2008   CRP 2.2 (H) 08/20/2016   REPTSTATUS 05/26/2018 FINAL 05/21/2018   GRAMSTAIN  07/24/2012    CYTOSPIN NO WBC SEEN NO ORGANISMS SEEN Gram Stain Report Called to,Read Back By and Verified With: R.[id number]/[email or web address]   CULT NO GROWTH 5 DAYS 05/21/2018     Lab Results  Component Value Date   ALBUMIN 3.9 10/17/2018   ALBUMIN 3.6 06/05/2018   ALBUMIN 3.2 (L) 06/01/2017   PREALBUMIN 18.9 05/21/2018    No results found for: MG Lab Results  Component Value Date   VD25OH 12.27 (L) 10/17/2018   VD25OH 2.63 (L) 06/01/2017    Lab Results  Component Value Date   PREALBUMIN 18.9 05/21/2018   CBC EXTENDED Latest Ref Rng & Units 10/17/2018 06/05/2018 05/22/2018  WBC 4.0 - 10.5 K/uL 11.7(H) 10.7(H) 11.4(H)  RBC 4.22 - 5.81 Mil/uL 4.65 4.49 3.83(L)  HGB 13.0 - 17.0 g/dL 12.7(L)  12.2(L) 9.9(L)  HCT 39.0 - 52.0 % 39.8 40.8 33.3(L)  PLT 150.0 - 400.0 K/uL 242.0 272 264  NEUTROABS 1.4 - 7.7 K/uL 8.1(H) - -  LYMPHSABS 0.7 - 4.0 K/uL 2.8 - -     Body mass index is 33.81 kg/m.  Orders:  No orders of the defined types were placed in this encounter.  No orders of the defined types were placed in this encounter.    Procedures: No procedures performed  Clinical Data: No additional findings.  ROS:  All other systems negative, except as noted in the HPI. Review of Systems  Objective: Vital Signs: Ht 5\' 4"  (1.626 m)   Wt 197 lb (89.4 kg)   BMI 33.81 kg/m   Specialty Comments:  No specialty comments available.  PMFS  History: Patient Active Problem List   Diagnosis Date Noted  . 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[place]) 11/13/2018  . Diabetic foot ([place]) 10/17/2018  . Non-pressure chronic ulcer of other part of right foot limited to breakdown of skin ([place]) 09/11/2018  . Type 2 diabetes mellitus with proliferative retinopathy of both eyes, without long-term current use of insulin ([place]) 07/14/2018  . Type 2 diabetes mellitus with hyperglycemia, without long-term current use of insulin ([place]) 07/14/2018  . Wound cellulitis 05/22/2018  . Osteomyelitis of foot, acute ([place]) 05/21/2018  . 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 Diabetic ulcer of left midfoot associated with diabetes mellitus due to underlying condition, with fat layer exposed ([place]) 08/26/2017  . B12 deficiency 06/15/2017  . Vitamin D deficiency 06/03/2017  . Iron deficiency anemia 06/01/2017  . Diabetic foot infection ([name])   . Osteomyelitis ([place]) 05/25/2017  . Vitreous floaters of right eye 09/15/2016  . Non compliance w medication regimen 09/15/2016  . Depression 09/01/2016  . Foot ulcer due to secondary DM ([place]) 08/20/2016  . Hidradenitis suppurativa 10/13/2015  . Peripheral polyneuropathy 07/07/2015  . Coronary atherosclerosis of native coronary artery 08/17/2013  . Diabetic retinopathy ([place]) 02/02/2013  . Diabetic neuropathy ([place]) 12/01/2012  . Hypertension associated with diabetes ([place]) 08/03/2012  . Tobacco use disorder 08/03/2012  . Erectile dysfunction 08/03/2012  . Diabetes mellitus with neurological manifestations, uncontrolled ([place]) 05/26/2006   Past Medical History:  Diagnosis Date  . Diabetes mellitus type II, uncontrolled ([place])   . Hyperlipidemia   . Hypertension   . Other and unspecified angina pectoris   . Tobacco use     Family History  Problem Relation Age of Onset  . Cancer Mother   . Diabetes Father   . Hypertension Father     Past Surgical History:  Procedure Laterality Date  . AMPUTATION  Left 08/22/2016   Procedure: GREAT TOE AMPUTATION;  Surgeon: [name], MD;  Location: [facility];  Service: Orthopedics;  Laterality: Left;  . AMPUTATION Right 05/28/2017   Procedure: AMPUTATION 1st & 3rd TOE;  Surgeon: [name], MD;  Location: [facility];  Service: Orthopedics;  Laterality: Right;  . EYE SURGERY    . LEFT HEART CATHETERIZATION WITH CORONARY ANGIOGRAM N/A 07/31/2013   Procedure: LEFT HEART CATHETERIZATION WITH CORONARY ANGIOGRAM;  Surgeon: Jettie Booze, MD;  Location: [facility];  Service: Cardiovascular;  Laterality: N/A;  . spinal tap    . toe amputated     Social History   Occupational History  . Occupation: Leisure centre manager: [employer]  Tobacco Use  . Smoking status: Current Every Day Smoker    Packs/day: 1.00  Years: 20.00    Pack years: 20.00    Types: Cigarettes  . Smokeless tobacco: Never Used  . Tobacco comment: wants patches   Substance and Sexual Activity  . Alcohol use: Not Currently    Comment: occ  . Drug use: No  . Sexual activity: Not Currently

## 2019-01-17 ENCOUNTER — Ambulatory Visit: Payer: PRIVATE HEALTH INSURANCE | Admitting: Internal Medicine

## 2019-01-22 ENCOUNTER — Ambulatory Visit: Payer: Medicaid Other | Admitting: Orthopedic Surgery

## 2019-01-24 ENCOUNTER — Ambulatory Visit (INDEPENDENT_AMBULATORY_CARE_PROVIDER_SITE_OTHER): Payer: Medicaid Other | Admitting: Internal Medicine

## 2019-01-24 ENCOUNTER — Other Ambulatory Visit: Payer: Self-pay

## 2019-01-24 ENCOUNTER — Ambulatory Visit: Payer: Medicaid Other | Admitting: Family

## 2019-01-24 ENCOUNTER — Encounter: Payer: Self-pay | Admitting: Internal Medicine

## 2019-01-24 ENCOUNTER — Other Ambulatory Visit (INDEPENDENT_AMBULATORY_CARE_PROVIDER_SITE_OTHER): Payer: Medicaid Other

## 2019-01-24 DIAGNOSIS — E538 Deficiency of other specified B group vitamins: Secondary | ICD-10-CM

## 2019-01-24 DIAGNOSIS — E11610 Type 2 diabetes mellitus with diabetic neuropathic arthropathy: Secondary | ICD-10-CM

## 2019-01-24 DIAGNOSIS — N183 Chronic kidney disease, stage 3 unspecified: Secondary | ICD-10-CM

## 2019-01-24 DIAGNOSIS — I10 Essential (primary) hypertension: Secondary | ICD-10-CM

## 2019-01-24 DIAGNOSIS — E1159 Type 2 diabetes mellitus with other circulatory complications: Secondary | ICD-10-CM

## 2019-01-24 DIAGNOSIS — I25119 Atherosclerotic heart disease of native coronary artery with unspecified angina pectoris: Secondary | ICD-10-CM | POA: Diagnosis not present

## 2019-01-24 DIAGNOSIS — Z6833 Body mass index (BMI) 33.0-33.9, adult: Secondary | ICD-10-CM

## 2019-01-24 DIAGNOSIS — F17200 Nicotine dependence, unspecified, uncomplicated: Secondary | ICD-10-CM

## 2019-01-24 DIAGNOSIS — I152 Hypertension secondary to endocrine disorders: Secondary | ICD-10-CM

## 2019-01-24 DIAGNOSIS — N1832 Chronic kidney disease, stage 3b: Secondary | ICD-10-CM | POA: Insufficient documentation

## 2019-01-24 DIAGNOSIS — E669 Obesity, unspecified: Secondary | ICD-10-CM | POA: Insufficient documentation

## 2019-01-24 DIAGNOSIS — E6609 Other obesity due to excess calories: Secondary | ICD-10-CM

## 2019-01-24 LAB — BASIC METABOLIC PANEL
BUN: 18 mg/dL (ref 6–23)
CO2: 28 mEq/L (ref 19–32)
Calcium: 9.6 mg/dL (ref 8.4–10.5)
Chloride: 102 mEq/L (ref 96–112)
Creatinine, Ser: 1.63 mg/dL — ABNORMAL HIGH (ref 0.40–1.50)
GFR: 54.62 mL/min — ABNORMAL LOW (ref >60.00)
Glucose, Bld: 198 mg/dL — ABNORMAL HIGH (ref 70–99)
Potassium: 4.2 mEq/L (ref 3.5–5.1)
Sodium: 136 mEq/L (ref 135–145)

## 2019-01-24 LAB — HEPATIC FUNCTION PANEL
ALT: 9 U/L (ref 0–53)
AST: 8 U/L (ref 0–37)
Albumin: 3.8 g/dL (ref 3.5–5.2)
Alkaline Phosphatase: 92 U/L (ref 39–117)
Bilirubin, Direct: 0.1 mg/dL (ref 0.0–0.3)
Total Bilirubin: 0.4 mg/dL (ref 0.2–1.2)
Total Protein: 7.5 g/dL (ref 6.0–8.3)

## 2019-01-24 MED ORDER — CARVEDILOL 12.5 MG PO TABS: 12.5000 mg | ORAL_TABLET | Freq: Two times a day (BID) | ORAL | 11 refills | Status: DC

## 2019-01-24 NOTE — Assessment & Plan Note (Signed)
Diabetic kidney disease

## 2019-01-24 NOTE — Assessment & Plan Note (Signed)
Diet discussed 

## 2019-01-24 NOTE — Assessment & Plan Note (Addendum)
NAS diet. Wt loss Cont Lisinopril. Added Coreg

## 2019-01-24 NOTE — Assessment & Plan Note (Signed)
Try ON!, Zyn or Velo pouches in place of smoking

## 2019-01-24 NOTE — Progress Notes (Signed)
Subjective:  Patient ID: [name], male    DOB: [birth year]  Age: 48 y.o. MRN: [id number]  CC: No chief complaint on file.   HPI [name] presents for a 3 mo f/u - HTN, DM, diabetic foot C/o wt gain, smoking  Not taking Metoprolol  Outpatient Medications Prior to Visit  Medication Sig Dispense Refill   aspirin EC 81 MG tablet Take 81 mg by mouth daily.     b complex vitamins tablet Take 1 tablet by mouth daily. 100 tablet 3   blood glucose meter kit and supplies KIT Dispense based on patient and insurance preference. Use up to four times daily as directed. (FOR ICD-9 250.00, 250.01). 1 each 0   Cholecalciferol (VITAMIN D3) 1.25 MG (50000 UT) CAPS Take 1 capsule by mouth once a week. 8 capsule 0   Cholecalciferol (VITAMIN D3) 50 MCG (2000 UT) capsule Take 1 capsule (2,000 Units total) by mouth daily. 100 capsule 3   citalopram (CELEXA) 20 MG tablet Take 1 tablet (20 mg total) by mouth daily. 90 tablet 3   ferrous sulfate 325 (65 FE) MG tablet Take 1 tablet (325 mg total) by mouth daily. 90 tablet 1   gabapentin (NEURONTIN) 300 MG capsule Take 1 capsule (300 mg total) by mouth 3 (three) times daily. Patient needs office visit before refills will be given 270 capsule 3   glipiZIDE (GLUCOTROL) 5 MG tablet Take 1 tablet (5 mg total) by mouth 2 (two) times daily before a meal. 60 tablet 3   lisinopril (ZESTRIL) 20 MG tablet Take 1 tablet (20 mg total) by mouth daily. 90 tablet 3   metFORMIN (GLUCOPHAGE) 500 MG tablet Take 1 tablet (500 mg total) by mouth 2 (two) times daily with a meal. 180 tablet 3   mupirocin ointment (BACTROBAN) 2 % Apply 1 application topically 2 (two) times daily. 22 g 6   repaglinide (PRANDIN) 1 MG tablet Take 1 tablet (1 mg total) by mouth 3 (three) times daily before meals. 270 tablet 3   atorvastatin (LIPITOR) 20 MG tablet Take 1 tablet (20 mg total) by mouth daily. 90 tablet 3   metoprolol tartrate (LOPRESSOR) 25 MG tablet Take 1 tablet  (25 mg total) by mouth 2 (two) times daily. 180 tablet 3   No facility-administered medications prior to visit.     ROS: Review of Systems  Constitutional: Positive for fatigue and unexpected weight change. Negative for appetite change.  HENT: Negative for congestion, nosebleeds, sneezing, sore throat and trouble swallowing.   Eyes: Negative for itching and visual disturbance.  Respiratory: Negative for cough.   Cardiovascular: Negative for chest pain, palpitations and leg swelling.  Gastrointestinal: Positive for constipation. Negative for abdominal distention, blood in stool, diarrhea and nausea.  Genitourinary: Negative for frequency and hematuria.  Musculoskeletal: Negative for back pain, gait problem, joint swelling and neck pain.  Skin: Positive for wound. Negative for rash.  Neurological: Negative for dizziness, tremors, speech difficulty and weakness.  Psychiatric/Behavioral: Negative for agitation, dysphoric mood, sleep disturbance and suicidal ideas. The patient is not nervous/anxious.     Objective:  BP (!) 160/98 (BP Location: Left Arm, Patient Position: Sitting, Cuff Size: Large)    Pulse (!) 107    Temp 98.1 F (36.7 C) (Oral)    Ht _0  (1.626 m)    Wt 198 lb (89.8 kg)    SpO2 99%    BMI 33.99 kg/m   BP Readings from Last 3 Encounters:  01/24/19 Marland Kitchen)  160/98  11/10/18 (!) 162/98  10/17/18 130/88    Wt Readings from Last 3 Encounters:  01/24/19 198 lb (89.8 kg)  12/25/18 197 lb (89.4 kg)  11/27/18 197 lb (89.4 kg)    Physical Exam Constitutional:      General: He is not in acute distress.    Appearance: Normal appearance. He is well-developed. He is obese.     Comments: NAD  Eyes:     Conjunctiva/sclera: Conjunctivae normal.     Pupils: Pupils are equal, round, and reactive to light.  Neck:     Musculoskeletal: Normal range of motion.     Thyroid: No thyromegaly.     Vascular: No JVD.  Cardiovascular:     Rate and Rhythm: Normal rate and regular rhythm.       Heart sounds: Normal heart sounds. No murmur. No friction rub. No gallop.   Pulmonary:     Effort: Pulmonary effort is normal. No respiratory distress.     Breath sounds: Normal breath sounds. No wheezing or rales.  Chest:     Chest wall: No tenderness.  Abdominal:     General: Bowel sounds are normal. There is no distension.     Palpations: Abdomen is soft. There is no mass.     Tenderness: There is no abdominal tenderness. There is no guarding or rebound.  Musculoskeletal: Normal range of motion.        General: No tenderness.  Lymphadenopathy:     Cervical: No cervical adenopathy.  Skin:    General: Skin is warm and dry.     Findings: No rash.  Neurological:     Mental Status: He is alert and oriented to person, place, and time.     Cranial Nerves: No cranial nerve deficit.     Motor: No abnormal muscle tone.     Coordination: Coordination normal.     Gait: Gait normal.     Deep Tendon Reflexes: Reflexes are normal and symmetric.  Psychiatric:        Behavior: Behavior normal.        Thought Content: Thought content normal.        Judgment: Judgment normal.   wound is dressed  Lab Results  Component Value Date   WBC 11.7 (H) 10/17/2018   HGB 12.7 (L) 10/17/2018   HCT 39.8 10/17/2018   PLT 242.0 10/17/2018   GLUCOSE 344 (H) 10/17/2018   CHOL 126 10/17/2018   TRIG 90.0 10/17/2018   HDL 49.90 10/17/2018   LDLCALC 58 10/17/2018   ALT 10 10/17/2018   AST 7 10/17/2018   NA 136 10/17/2018   K 4.8 10/17/2018   CL 101 10/17/2018   CREATININE 1.85 (H) 10/17/2018   BUN 22 10/17/2018   CO2 23 10/17/2018   TSH 2.25 10/17/2018   PSA 0.75 10/17/2018   INR 1.1 (H) 07/26/2013   HGBA1C 11.2 (H) 10/17/2018   MICROALBUR 92.0 (H) 06/01/2017    No results found.  Assessment & Plan:   There are no diagnoses linked to this encounter.   No orders of the defined types were placed in this encounter.    Follow-up: No follow-ups on file.  Walker Kehr, MD

## 2019-01-24 NOTE — Patient Instructions (Addendum)
  These suggestions will probably help you to improve your metabolism if you are not overweight and to lose weight if you are overweight:   1.  Reduce your consumption of sugars and starches.  Eliminate high fructose corn syrup from your diet.  Reduce your consumption of processed foods.  For desserts try to have seasonal fruits, berries, nuts, cheeses or dark chocolate with more than 70% cacao. 2.  Do not snack 3.  You do not have to eat breakfast.  If you choose to have breakfast-eat plain greek yogurt, eggs, oatmeal (without sugar) 4.  Drink water, freshly brewed unsweetened tea (green, black or herbal) or coffee.  Do not drink sodas including diet sodas , juices, beverages sweetened with artificial sweeteners. 5.  Reduce your consumption of refined grains. 6.  Avoid protein drinks such as Optifast, Slim fast etc. Eat chicken, fish, meat, dairy and beans for your sources of protein 7.  Natural unprocessed fats like cold pressed virgin olive oil, butter, coconut oil are good for you.  Eat avocados 8.  Increase your consumption of fiber.  Fruits, berries, vegetables, whole grains, flaxseeds, Chia seeds, beans, popcorn, nuts, oatmeal are good sources of fiber 9.  Use vinegar in your diet, i.e. apple cider vinegar, red wine or balsamic vinegar 10.  You can try fasting.  For example you can skip breakfast and lunch every other day (24-hour fast) 11.  Stress reduction, good night sleep, relaxation, meditation, yoga and other physical activity is likely to help you to maintain low weight too. 12. If you're fasting - don't use Repaglinide   Try ON!, Zyn or Velo pouches in place of smoking

## 2019-01-24 NOTE — Assessment & Plan Note (Signed)
On B12 

## 2019-01-24 NOTE — Assessment & Plan Note (Signed)
F/u w/ podiatry

## 2019-01-25 ENCOUNTER — Ambulatory Visit (INDEPENDENT_AMBULATORY_CARE_PROVIDER_SITE_OTHER): Payer: Medicaid Other | Admitting: Orthopedic Surgery

## 2019-01-25 ENCOUNTER — Encounter: Payer: Self-pay | Admitting: Orthopedic Surgery

## 2019-01-25 VITALS — Ht 64.0 in | Wt 198.0 lb

## 2019-01-25 DIAGNOSIS — L97422 Non-pressure chronic ulcer of left heel and midfoot with fat layer exposed: Secondary | ICD-10-CM | POA: Diagnosis not present

## 2019-01-25 DIAGNOSIS — E08621 Diabetes mellitus due to underlying condition with foot ulcer: Secondary | ICD-10-CM

## 2019-01-25 LAB — HEMOGLOBIN A1C: Hgb A1c MFr Bld: 12.4 % — ABNORMAL HIGH (ref 4.6–6.5)

## 2019-01-25 LAB — VITAMIN D 25 HYDROXY (VIT D DEFICIENCY, FRACTURES): VITD: 14.82 ng/mL — ABNORMAL LOW (ref 30.00–100.00)

## 2019-01-26 ENCOUNTER — Other Ambulatory Visit: Payer: Self-pay | Admitting: Internal Medicine

## 2019-01-26 MED ORDER — VITAMIN D3 1.25 MG (50000 UT) PO CAPS: 1.0000 | ORAL_CAPSULE | ORAL | 0 refills | Status: DC

## 2019-01-26 MED ORDER — REPAGLINIDE 2 MG PO TABS: 2.0000 mg | ORAL_TABLET | Freq: Three times a day (TID) | ORAL | 0 refills | Status: DC

## 2019-01-30 ENCOUNTER — Encounter: Payer: Self-pay | Admitting: Orthopedic Surgery

## 2019-01-30 NOTE — Progress Notes (Signed)
Office Visit Note   Patient: [name]           Date of Birth: Mar 10, 1971           MRN: [id number] Visit Date: 01/25/2019              Requested by: Cassandria Anger, MD [place],  [place] [id number] PCP: Cassandria Anger, MD  Chief Complaint  Patient presents with  . Right Foot - Follow-up  . Left Foot - Follow-up      HPI: Patient is a 48 year old gentleman who presents in follow-up for Wagner grade 1 ulcer left foot.  Patient is in regular shoewear full weightbearing has a double upright brace dry dressing change daily.  Patient states he has an odor and a small amount of clear drainage.  Assessment & Plan: Visit Diagnoses:  1. Diabetic ulcer of left midfoot associated with diabetes mellitus due to underlying condition, with fat layer exposed ([place])     Plan: Callus was pared continue with the protective shoe wear.  Importance of pressure unloading was reviewed.  Follow-Up Instructions: Return in about 4 weeks (around 02/22/2019).   Ortho Exam  Patient is alert, oriented, no adenopathy, well-dressed, normal affect, normal respiratory effort. Examination patient has a Wagner grade 1 ulcer on the left foot that is 5 cm in diameter 5 mm deep there is callus which is pared.  There is healthy granulation tissue at the base no exposed bone or tendon no cellulitis no odor no drainage.  Imaging: No results found. No images are attached to the encounter.  Labs: Lab Results  Component Value Date   HGBA1C 12.4 (H) 01/24/2019   HGBA1C 11.2 (H) 10/17/2018   HGBA1C 7.9 (H) 05/21/2018   ESRSEDRATE 54 (H) 08/20/2016   ESRSEDRATE 8 07/24/2012   ESRSEDRATE 4 04/19/2008   CRP 2.2 (H) 08/20/2016   REPTSTATUS 05/26/2018 FINAL 05/21/2018   GRAMSTAIN  07/24/2012    CYTOSPIN NO WBC SEEN NO ORGANISMS SEEN Gram Stain Report Called to,Read Back By and Verified With: R.[id number]/[email or web address]   CULT NO GROWTH 5 DAYS 05/21/2018     Lab Results   Component Value Date   ALBUMIN 3.8 01/24/2019   ALBUMIN 3.9 10/17/2018   ALBUMIN 3.6 06/05/2018   PREALBUMIN 18.9 05/21/2018    No results found for: MG Lab Results  Component Value Date   VD25OH 14.82 (L) 01/24/2019   VD25OH 12.27 (L) 10/17/2018   VD25OH 2.63 (L) 06/01/2017    Lab Results  Component Value Date   PREALBUMIN 18.9 05/21/2018   CBC EXTENDED Latest Ref Rng & Units 10/17/2018 06/05/2018 05/22/2018  WBC 4.0 - 10.5 K/uL 11.7(H) 10.7(H) 11.4(H)  RBC 4.22 - 5.81 Mil/uL 4.65 4.49 3.83(L)  HGB 13.0 - 17.0 g/dL 12.7(L) 12.2(L) 9.9(L)  HCT 39.0 - 52.0 % 39.8 40.8 33.3(L)  PLT 150.0 - 400.0 K/uL 242.0 272 264  NEUTROABS 1.4 - 7.7 K/uL 8.1(H) - -  LYMPHSABS 0.7 - 4.0 K/uL 2.8 - -     Body mass index is 33.99 kg/m.  Orders:  No orders of the defined types were placed in this encounter.  No orders of the defined types were placed in this encounter.    Procedures: No procedures performed  Clinical Data: No additional findings.  ROS:  All other systems negative, except as noted in the HPI. Review of Systems  Objective: Vital Signs: Ht 5\' 4"  (1.626 m)   Wt 198 lb (89.8 kg)   BMI  33.99 kg/m   Specialty Comments:  No specialty comments available.  PMFS History: Patient Active Problem List   Diagnosis Date Noted  . CRI (chronic renal insufficiency), stage 3 (moderate) 01/24/2019  . Obesity 01/24/2019  . 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[place]) 11/13/2018  . Diabetic foot ([place]) 10/17/2018  . Non-pressure chronic ulcer of other part of right foot limited to breakdown of skin ([place]) 09/11/2018  . 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 Type 2 diabetes mellitus with hyperglycemia, without long-term current use of insulin ([place]) 07/14/2018  . Wound cellulitis 05/22/2018  . Osteomyelitis of foot, acute ([place]) 05/21/2018  . 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 ([place])    . Diabetic ulcer of left midfoot associated with diabetes mellitus due to underlying condition, with fat layer exposed ([place]) 08/26/2017  . B12 deficiency 06/15/2017  . Vitamin D deficiency 06/03/2017  . Iron deficiency anemia 06/01/2017  . Diabetic foot infection ([place])   . Osteomyelitis ([place]) 05/25/2017  . Vitreous floaters of right eye 09/15/2016  . Non compliance w medication regimen 09/15/2016  . Depression 09/01/2016  . Foot ulcer due to secondary DM ([place]) 08/20/2016  . Hidradenitis suppurativa 10/13/2015  . Peripheral polyneuropathy 07/07/2015  . Coronary atherosclerosis of native coronary artery 08/17/2013  . Diabetic retinopathy ([place]) 02/02/2013  . Diabetic neuropathy ([place]) 12/01/2012  . Hypertension associated with diabetes ([place]) 08/03/2012  . Tobacco use disorder 08/03/2012  . Erectile dysfunction 08/03/2012  . Diabetes mellitus with neurological manifestations, uncontrolled ([place]) 05/26/2006   Past Medical History:  Diagnosis Date  . Diabetes mellitus type II, uncontrolled ([place])   . Hyperlipidemia   . Hypertension   . Other and unspecified angina pectoris   . Tobacco use     Family History  Problem Relation Age of Onset  . Cancer Mother   . Diabetes Father   . Hypertension Father     Past Surgical History:  Procedure Laterality Date  . AMPUTATION Left 08/22/2016   Procedure: GREAT TOE AMPUTATION;  Surgeon: [name], MD;  Location: [facility];  Service: Orthopedics;  Laterality: Left;  . AMPUTATION Right 05/28/2017   Procedure: AMPUTATION 1st & 3rd TOE;  Surgeon: [name], MD;  Location: [facility];  Service: Orthopedics;  Laterality: Right;  . EYE SURGERY    . LEFT HEART CATHETERIZATION WITH CORONARY ANGIOGRAM N/A 07/31/2013   Procedure: LEFT HEART CATHETERIZATION WITH CORONARY ANGIOGRAM;  Surgeon: Jettie Booze, MD;  Location: [facility] CATH [facility];  Service: Cardiovascular;  Laterality: N/A;  . spinal tap    . toe amputated     Social History   Occupational  History  . Occupation: Leisure centre manager:  [employer]  Tobacco Use  . Smoking status: Current Every Day Smoker    Packs/day: 1.00    Years: 20.00    Pack years: 20.00    Types: Cigarettes  . Smokeless tobacco: Never Used  . Tobacco comment: wants patches   Substance and Sexual Activity  . Alcohol use: Not Currently    Comment: occ  . Drug use: No  . Sexual activity: Not Currently

## 2019-02-14 ENCOUNTER — Ambulatory Visit: Payer: PRIVATE HEALTH INSURANCE | Admitting: Internal Medicine

## 2019-02-26 ENCOUNTER — Ambulatory Visit: Payer: Medicaid Other | Admitting: Orthopedic Surgery

## 2019-02-28 ENCOUNTER — Ambulatory Visit: Payer: Medicaid Other | Admitting: Internal Medicine

## 2019-03-13 ENCOUNTER — Other Ambulatory Visit: Payer: Self-pay

## 2019-03-13 ENCOUNTER — Ambulatory Visit (INDEPENDENT_AMBULATORY_CARE_PROVIDER_SITE_OTHER): Payer: Medicaid Other | Admitting: Orthopedic Surgery

## 2019-03-13 ENCOUNTER — Encounter: Payer: Self-pay | Admitting: Orthopedic Surgery

## 2019-03-13 VITALS — Ht 64.0 in | Wt 198.0 lb

## 2019-03-13 DIAGNOSIS — L97511 Non-pressure chronic ulcer of other part of right foot limited to breakdown of skin: Secondary | ICD-10-CM

## 2019-03-13 MED ORDER — MUPIROCIN 2 % EX OINT: 1.0000 "application " | TOPICAL_OINTMENT | Freq: Two times a day (BID) | CUTANEOUS | 6 refills | Status: DC

## 2019-03-13 NOTE — Addendum Note (Signed)
Addended by: Georgette Dover on: 03/13/2019 02:09 PM   Modules accepted: Orders

## 2019-03-13 NOTE — Progress Notes (Signed)
Office Visit Note   Patient: Joseph SHEHADEH           Date of Birth: December 13, 1970           MRN: [id number] Visit Date: 03/13/2019              Requested by: Cassandria Anger, MD [place],  [place] [id number] PCP: Cassandria Anger, MD  Chief Complaint  Patient presents with  . Right Foot - Follow-up  . Left Foot - Follow-up      HPI: This is a pleasant 48 year old gentleman we have been following for a left foot plantar ulcer down to 5 pad he was last here 3 months ago for debridement.  He is now another ulcer beneath his right forefoot he thought that was it was draining just a little bit  Assessment & Plan: Visit Diagnoses: No diagnosis found.  Plan: He will cover these with mupirocin and dressing follow-up in 4 weeks sooner if he has any concerns  Follow-Up Instructions: No follow-ups on file.   Ortho Exam  Patient is alert, oriented, no adenopathy, well-dressed, normal affect, normal respiratory effort. Bilateral foot pulses are palpable.  Left foot: 6 x 6 ulcer down to fat pad no associated foul odor.  After verbal consent this was debrided to a healthy bleeding surface  Right foot: There is a 4 x 4 ulcer does not probe deep cannot appreciate any drainage today just mild odor verbal consent was contained obtained and is 2 was debrided down to a bleeding base dressings were applied  Imaging: No results found. No images are attached to the encounter.  Labs: Lab Results  Component Value Date   HGBA1C 12.4 (H) 01/24/2019   HGBA1C 11.2 (H) 10/17/2018   HGBA1C 7.9 (H) 05/21/2018   ESRSEDRATE 54 (H) 08/20/2016   ESRSEDRATE 8 07/24/2012   ESRSEDRATE 4 04/19/2008   CRP 2.2 (H) 08/20/2016   REPTSTATUS 05/26/2018 FINAL 05/21/2018   GRAMSTAIN  07/24/2012    CYTOSPIN NO WBC SEEN NO ORGANISMS SEEN Gram Stain Report Called to,Read Back By and Verified With: R.KELSER/[id number]/MURPHYD   CULT NO GROWTH 5 DAYS 05/21/2018     Lab Results  Component  Value Date   ALBUMIN 3.8 01/24/2019   ALBUMIN 3.9 10/17/2018   ALBUMIN 3.6 06/05/2018   PREALBUMIN 18.9 05/21/2018    No results found for: MG Lab Results  Component Value Date   VD25OH 14.82 (L) 01/24/2019   VD25OH 12.27 (L) 10/17/2018   VD25OH 2.63 (L) 06/01/2017    Lab Results  Component Value Date   PREALBUMIN 18.9 05/21/2018   CBC EXTENDED Latest Ref Rng & Units 10/17/2018 06/05/2018 05/22/2018  WBC 4.0 - 10.5 K/uL 11.7(H) 10.7(H) 11.4(H)  RBC 4.22 - 5.81 Mil/uL 4.65 4.49 3.83(L)  HGB 13.0 - 17.0 g/dL 12.7(L) 12.2(L) 9.9(L)  HCT 39.0 - 52.0 % 39.8 40.8 33.3(L)  PLT 150.0 - 400.0 K/uL 242.0 272 264  NEUTROABS 1.4 - 7.7 K/uL 8.1(H) - -  LYMPHSABS 0.7 - 4.0 K/uL 2.8 - -     Body mass index is 33.99 kg/m.  Orders:  No orders of the defined types were placed in this encounter.  Meds ordered this encounter  Medications  . mupirocin ointment (BACTROBAN) 2 %    Sig: Apply 1 application topically 2 (two) times daily.    Dispense:  22 g    Refill:  6     Procedures: No procedures performed  Clinical Data: No additional findings.  ROS:  All other systems negative, except as noted in the HPI. Review of Systems  Objective: Vital Signs: Ht 5\' 4"  (1.626 m)   Wt 198 lb (89.8 kg)   BMI 33.99 kg/m   Specialty Comments:  No specialty comments available.  PMFS History: Patient Active Problem List   Diagnosis Date Noted  . CRI (chronic renal insufficiency), stage 3 (moderate) 01/24/2019  . Obesity 01/24/2019  . 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[place]) 11/13/2018  . Diabetic foot ([place]) 10/17/2018  . 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 Type 2 diabetes mellitus with hyperglycemia, without long-term current use of insulin ([place]) 07/14/2018  . Wound cellulitis 05/22/2018  . Osteomyelitis  of foot, acute ([place]) 05/21/2018  . 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 ([place])   . Diabetic ulcer of left midfoot associated with diabetes mellitus due to underlying condition, with fat layer exposed ([place]) 08/26/2017  . B12 deficiency 06/15/2017  . Vitamin D deficiency 06/03/2017  . Iron deficiency anemia 06/01/2017  . Diabetic foot infection ([place])   . Osteomyelitis ([place]) 05/25/2017  . Vitreous floaters of right eye 09/15/2016  . Non compliance w medication regimen 09/15/2016  . Depression 09/01/2016  . Foot ulcer due to secondary DM ([place]) 08/20/2016  . Hidradenitis suppurativa 10/13/2015  . Peripheral polyneuropathy 07/07/2015  . Coronary atherosclerosis of native coronary artery 08/17/2013  . Diabetic retinopathy ([place]) 02/02/2013  . Diabetic neuropathy ([place]) 12/01/2012  . Hypertension associated with diabetes ([place]) 08/03/2012  . Tobacco use disorder 08/03/2012  . Erectile dysfunction 08/03/2012  . Diabetes mellitus with neurological manifestations, uncontrolled ([place]) 05/26/2006   Past Medical History:  Diagnosis Date  . Diabetes mellitus type II, uncontrolled ([place])   . Hyperlipidemia   . Hypertension   . Other and unspecified angina pectoris   . Tobacco use     Family History  Problem Relation Age of Onset  . Cancer Mother   . Diabetes Father   . Hypertension Father     Past Surgical History:  Procedure Laterality Date  . AMPUTATION Left 08/22/2016   Procedure: GREAT TOE AMPUTATION;  Surgeon: [name], MD;  Location: [facility];  Service: Orthopedics;  Laterality: Left;  . AMPUTATION Right 05/28/2017   Procedure: AMPUTATION 1st & 3rd TOE;  Surgeon: [name], MD;  Location: [facility];  Service: Orthopedics;  Laterality: Right;  . EYE SURGERY    . LEFT HEART CATHETERIZATION WITH CORONARY ANGIOGRAM N/A 07/31/2013   Procedure: LEFT HEART CATHETERIZATION WITH CORONARY ANGIOGRAM;  Surgeon: Jettie Booze, MD;  Location: [facility] CATH LAB;  Service:  Cardiovascular;  Laterality: N/A;  . spinal tap    . toe amputated     Social History   Occupational History  . Occupation: Leisure centre manager: [employer]  Tobacco Use  . Smoking status: Current Every Day Smoker    Packs/day: 1.00    Years: 20.00    Pack years: 20.00    Types: Cigarettes  . Smokeless tobacco: Never Used  . Tobacco comment: wants patches   Substance and Sexual Activity  . Alcohol use: Not Currently    Comment: occ  . Drug use: No  . Sexual activity: Not Currently

## 2019-03-27 ENCOUNTER — Encounter: Payer: Self-pay | Admitting: Internal Medicine

## 2019-03-27 ENCOUNTER — Other Ambulatory Visit: Payer: Self-pay

## 2019-03-27 ENCOUNTER — Ambulatory Visit (INDEPENDENT_AMBULATORY_CARE_PROVIDER_SITE_OTHER): Payer: Medicaid Other | Admitting: Internal Medicine

## 2019-03-27 VITALS — BP 162/102 | HR 95 | Temp 98.0°F | Ht 64.0 in | Wt 200.8 lb

## 2019-03-27 DIAGNOSIS — E11610 Type 2 diabetes mellitus with diabetic neuropathic arthropathy: Secondary | ICD-10-CM

## 2019-03-27 DIAGNOSIS — E1165 Type 2 diabetes mellitus with hyperglycemia: Secondary | ICD-10-CM | POA: Diagnosis not present

## 2019-03-27 DIAGNOSIS — E113593 Type 2 diabetes mellitus with proliferative diabetic retinopathy without macular edema, bilateral: Secondary | ICD-10-CM | POA: Diagnosis not present

## 2019-03-27 LAB — GLUCOSE, POCT (MANUAL RESULT ENTRY): POC Glucose: 286 mg/dl — AB (ref 70–99)

## 2019-03-27 MED ORDER — TRESIBA FLEXTOUCH 100 UNIT/ML ~~LOC~~ SOPN: 20.0000 [IU] | PEN_INJECTOR | Freq: Every day | SUBCUTANEOUS | 3 refills | Status: DC

## 2019-03-27 MED ORDER — INSULIN PEN NEEDLE 32G X 4 MM MISC: 1.0000 | Freq: Every day | 3 refills | Status: DC

## 2019-03-27 NOTE — Progress Notes (Signed)
Name: [name]  Age/ Sex: 48 y.o., male   MRN/ DOB: [id number], [birth year]     PCP: Cassandria Anger, MD   Reason for Endocrinology Evaluation: Type 2 Diabetes Mellitus     Initial Endocrinology Clinic Visit: 07/14/2018    PATIENT IDENTIFIER: [name] is a 48 y.o. male with a past medical history of T2DM, and HTN, Hx toe amputations Bilaterally  The patient has followed with Endocrinology clinic since 07/14/2018 for consultative assistance with management of his diabetes.  DIABETIC HISTORY:  [name] was diagnosed with T2DM over 20 years ago. He has been on Metformin since diagnosis, he also has been on glipizide and prandin but never took them. He was on insulin until sometime in 2019. His hemoglobin A1c has ranged from 7.0% in 2014, peaking at 11.3% in 2019.   SUBJECTIVE:   During the last visit (11/10/2018): Continued Metformin and added Glipizide    Today (03/27/2019): [name] is here for a 3 month  follow up on diabetes management.  He checks his blood sugars 2 times a week . Pt did not being his meter today. Since his last visit here, Glipizide has been switched to Prandin by his PCP. The patient has not had hypoglycemic episodes since the last clinic visit.Otherwise, the patient has not required any recent emergency interventions for hypoglycemia and has not had recent hospitalizations secondary to hyper or hypoglycemic episodes.    The pt admits to not taking Glipizide nor the prandin, pt states he can not afford his medications.   Pt is following with Dr. Sharol Given for charcot foot  ROS: As per HPI and as detailed below: Review of Systems  Constitutional: Negative for fever.  HENT: Negative for congestion and sore throat.   Respiratory: Negative for cough and shortness of breath.   Cardiovascular: Negative for chest pain and palpitations.  Gastrointestinal: Negative for diarrhea and nausea.  Genitourinary: Negative for frequency.    Endo/Heme/Allergies: Negative for polydipsia.      HOME DIABETES REGIMEN:  Metformin 500 mg XR 1 tab BID  Prandin 2 mg TID - not taking    GLUCOSE LOG : Did not bring      HISTORY:  Past Medical History:  Past Medical History:  Diagnosis Date  . Diabetes mellitus type II, uncontrolled ([place])   . Hyperlipidemia   . Hypertension   . Other and unspecified angina pectoris   . Tobacco use    Past Surgical History:  Past Surgical History:  Procedure Laterality Date  . AMPUTATION Left 08/22/2016   Procedure: GREAT TOE AMPUTATION;  Surgeon: [name], MD;  Location: [facility];  Service: Orthopedics;  Laterality: Left;  . AMPUTATION Right 05/28/2017   Procedure: AMPUTATION 1st & 3rd TOE;  Surgeon: [name], MD;  Location: [facility];  Service: Orthopedics;  Laterality: Right;  . EYE SURGERY    . LEFT HEART CATHETERIZATION WITH CORONARY ANGIOGRAM N/A 07/31/2013   Procedure: LEFT HEART CATHETERIZATION WITH CORONARY ANGIOGRAM;  Surgeon: Jettie Booze, MD;  Location: [facility] CATH LAB;  Service: Cardiovascular;  Laterality: N/A;  . spinal tap    . toe amputated      Social History:  reports that he has been smoking cigarettes. He has a 20.00 pack-year smoking history. He has never used smokeless tobacco. He reports previous alcohol use. He reports that he does not use drugs. Family History:  Family History  Problem Relation Age of Onset  . Cancer Mother   .  Diabetes Father   . Hypertension Father      HOME MEDICATIONS: Allergies as of 03/27/2019      Reactions   Bee Venom Swelling   SWELLING REACTION UNSPECIFIED    Penicillins Hives, Other (See Comments)   PATIENT HAS HAD A PCN REACTION WITH IMMEDIATE RASH, FACIAL/TONGUE/THROAT SWELLING, SOB, OR LIGHTHEADEDNESS WITH HYPOTENSION:  #  #  #  YES  #  #  #   Has patient had a PCN reaction causing severe rash involving mucus membranes or skin necrosis: No Has patient had a PCN reaction that required hospitalization: No Has patient  had a PCN reaction occurring within the last 10 years: No Allergy Severity noted prior to 05/28/17      Medication List       Accurate as of March 27, 2019  2:41 PM. If you have any questions, ask your nurse or doctor.        STOP taking these medications   repaglinide 2 MG tablet Commonly known as: Prandin Stopped by: Dorita Sciara, MD     TAKE these medications   aspirin EC 81 MG tablet Take 81 mg by mouth daily.   atorvastatin 20 MG tablet Commonly known as: LIPITOR Take 1 tablet (20 mg total) by mouth daily.   b complex vitamins tablet Take 1 tablet by mouth daily.   blood glucose meter kit and supplies Kit Dispense based on patient and insurance preference. Use up to four times daily as directed. (FOR ICD-9 250.00, 250.01).   carvedilol 12.5 MG tablet Commonly known as: COREG Take 1 tablet (12.5 mg total) by mouth 2 (two) times daily with a meal.   citalopram 20 MG tablet Commonly known as: CeleXA Take 1 tablet (20 mg total) by mouth daily.   ferrous sulfate 325 (65 FE) MG tablet Take 1 tablet (325 mg total) by mouth daily.   gabapentin 300 MG capsule Commonly known as: Neurontin Take 1 capsule (300 mg total) by mouth 3 (three) times daily. Patient needs office visit before refills will be given   Insulin Pen Needle 32G X 4 MM Misc 1 Device by Does not apply route daily. Started by: Dorita Sciara, MD   lisinopril 20 MG tablet Commonly known as: ZESTRIL Take 1 tablet (20 mg total) by mouth daily.   metFORMIN 500 MG tablet Commonly known as: GLUCOPHAGE Take 1 tablet (500 mg total) by mouth 2 (two) times daily with a meal.   mupirocin ointment 2 % Commonly known as: Bactroban Apply 1 application topically 2 (two) times daily.   Tyler Aas FlexTouch 100 UNIT/ML Sopn FlexTouch Pen Generic drug: insulin degludec Inject 0.2 mLs (20 Units total) into the skin daily. Started by: Dorita Sciara, MD   Vitamin D3 50 MCG (2000 UT)  capsule Take 1 capsule (2,000 Units total) by mouth daily.   Vitamin D3 1.25 MG (50000 UT) Caps Take 1 capsule by mouth once a week.      OBJECTIVE:    PHYSICAL EXAM: VS: BP (!) 162/102 (BP Location: Left Arm, Patient Position: Sitting, Cuff Size: Normal)   Pulse 95   Temp 98 F (36.7 C)   Ht 5' 4"  (1.626 m)   Wt 200 lb 12.8 oz (91.1 kg)   SpO2 98%   BMI 34.47 kg/m    EXAM: General: Pt appears well and is in NAD  Lungs: Clear with good BS bilat with no rales, rhonchi, or wheezes  Heart: Auscultation: RRR with normal S1 and S2  Abdomen: Normoactive bowel sounds, soft, nontender, without masses or organomegaly palpable  Lymphatics:   Extremities: BL LE: no pretibial edema   Neuro: Cranial nerves: II - XII grossly intact  Motor: normal strength throughout  Mental Status: Judgment, insight: intact Orientation: oriented to time, place, and person Mood and affect: no depression, anxiety, or agitation   Diabetic Foot Exam 11/10/2018  Right 1st and 3rd toe amputations, Left 1st toe amputation Left plantar open wound under, Right plantar callous formation  The pedal pulses are undetectable  The sensation is decreased to a screening 5.07, 10 gram monofilament bilaterally  DATA REVIEWED:  Lab Results  Component Value Date   HGBA1C 12.4 (H) 01/24/2019   HGBA1C 11.2 (H) 10/17/2018   HGBA1C 7.9 (H) 05/21/2018   Lab Results  Component Value Date   MICROALBUR 92.0 (H) 06/01/2017   LDLCALC 58 10/17/2018   CREATININE 1.63 (H) 01/24/2019   Lab Results  Component Value Date   MICRALBCREAT 50.3 (H) 06/01/2017     Lab Results  Component Value Date   CHOL 126 10/17/2018   HDL 49.90 10/17/2018   LDLCALC 58 10/17/2018   TRIG 90.0 10/17/2018   CHOLHDL 3 10/17/2018         ASSESSMENT / PLAN / RECOMMENDATIONS:   1) Type 2 Diabetes Mellitus, Poorly controlled, With Neuropathic complications, retinopathy and B/L toe amputations - Most recent A1c of 12.4 %. Goal A1c <  7.0  %.   - Pt continues with hyperglycemia due to medication non-adherence. I have put him on glipizide that he did not take, his PCP put him on prandin but the pt admits that he is unable to take any medication other then metformin due to cost.  - I have advised the pt to contact medicaid and ask for a patient advocate or a social worker to help with medication , he also states he is very confused about what to take and not to take.  - Given that there are no pt assistance programs for prandin nor glipizide that I am aware of, will switch him to long acting insulin and proceed with pt assistance program. He was provided with the forms to fill out and a sample of tresiba.  - We discussed the importance of compliance with medications to improve his glycemic control and avoid foot infections, promote healing and avoid amputations.  - Pt expressed understanding    MEDICATIONS:  Continue Metformin 500 mg XR 1 tab with Breakfast and 1 tablet with Supper   Stop Prandin   StartTresiba at 20 units daily    EDUCATION / INSTRUCTIONS:  BG monitoring instructions: Patient is instructed to check his blood sugars 2 times a day, fasting and bedtime.  Call [facility] if: BG persistently < 70 or > 300. . I reviewed the Rule of 15 for the treatment of hypoglycemia in detail with the patient. Literature supplied.     F/U in 3 months    Signed electronically by: Mack Guise, MD  [facility] Endocrinology  [facility] [address] [place], [place] [address] Phone: [phone] FAX: [phone]   CC: Cassandria Anger, MD [place] [address] Phone: [phone]  Fax: [phone]  Return to Endocrinology clinic as below: Future Appointments  Date Time Provider [place]  04/10/2019  1:00 PM [name], MD [facility] None  04/26/2019  1:40 PM Plotnikov, Evie Lacks, MD [facility] None  07/03/2019  9:50 AM Eleanora Guinyard,  Melanie Crazier,  MD [facility] None

## 2019-03-27 NOTE — Patient Instructions (Addendum)
-   Continue Metformin 500 mg Twice a day  - Stop Prandin - Start Tresiba (insulin) 20 units daily    - Check Sugar Before breakfast and Supper    - HOW TO TREAT LOW BLOOD SUGARS (Blood sugar LESS THAN 70 MG/DL)  Please follow the RULE OF 15 for the treatment of hypoglycemia treatment (when your (blood sugars are less than 70 mg/dL)    STEP 1: Take 15 grams of carbohydrates when your blood sugar is low, which includes:   3-4 GLUCOSE TABS  OR  3-4 OZ OF JUICE OR REGULAR SODA OR  ONE TUBE OF GLUCOSE GEL     STEP 2: RECHECK blood sugar in 15 MINUTES STEP 3: If your blood sugar is still low at the 15 minute recheck --> then, go back to STEP 1 and treat AGAIN with another 15 grams of carbohydrates.

## 2019-04-04 ENCOUNTER — Other Ambulatory Visit: Payer: Self-pay

## 2019-04-04 ENCOUNTER — Telehealth: Payer: Self-pay | Admitting: Internal Medicine

## 2019-04-04 MED ORDER — TRESIBA FLEXTOUCH 100 UNIT/ML ~~LOC~~ SOPN: 20.0000 [IU] | PEN_INJECTOR | Freq: Every day | SUBCUTANEOUS | 3 refills | Status: DC

## 2019-04-04 NOTE — Telephone Encounter (Signed)
Refill sent, vm lft for pt

## 2019-04-04 NOTE — Telephone Encounter (Signed)
MEDICATION: Tyler Aas  PHARMACY:  [facility] on [address]  IS THIS A 90 DAY SUPPLY :   IS PATIENT OUT OF MEDICATION:  NEW RX from 03/17/19  IF NOT; HOW MUCH IS LEFT:   LAST APPOINTMENT DATE: @12 /29/2020  NEXT APPOINTMENT DATE:@4 /08/2019  DO WE HAVE YOUR PERMISSION TO LEAVE A DETAILED MESSAGE: yes  OTHER COMMENTS:    **Let patient know to contact pharmacy at the end of the day to make sure medication is ready. **  ** Please notify patient to allow 48-72 hours to process**  **Encourage patient to contact the pharmacy for refills or they can request refills through [facility]**

## 2019-04-09 ENCOUNTER — Telehealth: Payer: Self-pay

## 2019-04-09 NOTE — Telephone Encounter (Signed)
Patient called in stating that his medicaid denied his medication and he needs an alternative to take since he can't get anything approved     Please call and advise

## 2019-04-09 NOTE — Telephone Encounter (Signed)
Pt's are responsible for reaching out to their insurance companies and letting us know what is covered so that we can order it. I will reach out to pt and inform him of this and let him know to call us back once he knows.

## 2019-04-10 ENCOUNTER — Ambulatory Visit (INDEPENDENT_AMBULATORY_CARE_PROVIDER_SITE_OTHER): Payer: Medicaid Other | Admitting: Orthopedic Surgery

## 2019-04-10 ENCOUNTER — Other Ambulatory Visit: Payer: Self-pay

## 2019-04-10 ENCOUNTER — Encounter: Payer: Self-pay | Admitting: Orthopedic Surgery

## 2019-04-10 VITALS — Ht 64.0 in | Wt 200.0 lb

## 2019-04-10 DIAGNOSIS — L97511 Non-pressure chronic ulcer of other part of right foot limited to breakdown of skin: Secondary | ICD-10-CM | POA: Diagnosis not present

## 2019-04-10 DIAGNOSIS — E11610 Type 2 diabetes mellitus with diabetic neuropathic arthropathy: Secondary | ICD-10-CM | POA: Diagnosis not present

## 2019-04-10 DIAGNOSIS — L97422 Non-pressure chronic ulcer of left heel and midfoot with fat layer exposed: Secondary | ICD-10-CM

## 2019-04-10 DIAGNOSIS — E08621 Diabetes mellitus due to underlying condition with foot ulcer: Secondary | ICD-10-CM

## 2019-04-10 NOTE — Progress Notes (Signed)
Office Visit Note   Patient: [name]           Date of Birth: September 26, 1970           MRN: [id number] Visit Date: 04/10/2019              Requested by: Cassandria Anger, MD [address] [place],  [place] [address] PCP: Plotnikov, Evie Lacks, MD  Chief Complaint  Patient presents with  . Right Foot - Follow-up  . Left Foot - Follow-up      HPI: Patient is a 49 year old gentleman with diabetic insensate neuropathy toe collapse who presents with recurrent ulcerations midfoot bilateral feet.  He currently wears extra-depth shoes custom orthotics and a double upright brace.  Assessment & Plan: Visit Diagnoses:  1. 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 Diabetic ulcer of left midfoot associated with diabetes mellitus due to underlying condition, with fat layer exposed ([place])   3. 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  Follow-Up Instructions: Return in about 4 weeks (around 05/08/2019).   Ortho Exam  Patient is alert, oriented, no adenopathy, well-dressed, normal affect, normal respiratory effort. Examination patient has 2 large recurrent Wagner grade 1 ulcers in the plantar aspect of both feet he has Charcot arthropathy with rocker-bottom deformity without redness cellulitis drainage no signs of active Charcot process.  He has massive ulcers on the plantar aspect of both feet.  After informed consent the ulcer on the right foot was debrided of skin and soft tissue back to healthy viable tissue.  The ulcer was 4 x 6 cm after debridement and 5 mm deep.  Examination of the left foot he also has a large midfoot ulcer after informed consent a 10 blade knife was used to debride the skin and soft tissue back to healthy viable tissue this ulcer is 6 cm in diameter 5 mm deep.  Band-Aids were applied to both wounds.  There is no  exposed bone or tendon no cellulitis no drainage.  Imaging: No results found. No images are attached to the encounter.  Labs: Lab Results  Component Value Date   HGBA1C 12.4 (H) 01/24/2019   HGBA1C 11.2 (H) 10/17/2018   HGBA1C 7.9 (H) 05/21/2018   ESRSEDRATE 54 (H) 08/20/2016   ESRSEDRATE 8 07/24/2012   ESRSEDRATE 4 04/19/2008   CRP 2.2 (H) 08/20/2016   REPTSTATUS 05/26/2018 FINAL 05/21/2018   GRAMSTAIN  07/24/2012    CYTOSPIN NO WBC SEEN NO ORGANISMS SEEN Gram Stain Report Called to,Read Back By and Verified With: R.[id number]/[email or web address]   CULT NO GROWTH 5 DAYS 05/21/2018     Lab Results  Component Value Date   ALBUMIN 3.8 01/24/2019   ALBUMIN 3.9 10/17/2018   ALBUMIN 3.6 06/05/2018   PREALBUMIN 18.9 05/21/2018    No results found for: MG Lab Results  Component Value Date   VD25OH 14.82 (L) 01/24/2019   VD25OH 12.27 (L) 10/17/2018   VD25OH 2.63 (L) 06/01/2017    Lab Results  Component Value Date   PREALBUMIN 18.9 05/21/2018   CBC EXTENDED Latest Ref Rng & Units 10/17/2018 06/05/2018 05/22/2018  WBC 4.0 - 10.5 K/uL 11.7(H) 10.7(H) 11.4(H)  RBC 4.22 - 5.81 Mil/uL 4.65 4.49 3.83(L)  HGB 13.0 - 17.0 g/dL 12.7(L) 12.2(L) 9.9(L)  HCT 39.0 - 52.0 % 39.8 40.8 33.3(L)  PLT 150.0 - 400.0 K/uL 242.0  272 264  NEUTROABS 1.4 - 7.7 K/uL 8.1(H) - -  LYMPHSABS 0.7 - 4.0 K/uL 2.8 - -     Body mass index is 34.33 kg/m.  Orders:  No orders of the defined types were placed in this encounter.  No orders of the defined types were placed in this encounter.    Procedures: No procedures performed  Clinical Data: No additional findings.  ROS:  All other systems negative, except as noted in the HPI. Review of Systems  Objective: Vital Signs: Ht 5\' 4"  (1.626 m)   Wt 200 lb (90.7 kg)   BMI 34.33 kg/m   Specialty Comments:  No specialty comments available.  PMFS History: Patient Active Problem List   Diagnosis Date Noted  . CRI (chronic renal  insufficiency), stage 3 (moderate) 01/24/2019  . Obesity 01/24/2019  . 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[place]) 11/13/2018  . Diabetic foot ([place]) 10/17/2018  . Non-pressure chronic ulcer of other part of right foot limited to breakdown of skin ([place]) 09/11/2018  . 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 Type 2 diabetes mellitus with hyperglycemia, without long-term current use of insulin ([place]) 07/14/2018  . Wound cellulitis 05/22/2018  . Osteomyelitis of foot, acute ([place]) 05/21/2018  . 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 Diabetic ulcer of left midfoot associated with diabetes mellitus due to underlying condition, with fat layer exposed ([place]) 08/26/2017  . B12 deficiency 06/15/2017  . Vitamin D deficiency 06/03/2017  . Iron deficiency anemia 06/01/2017  . Diabetic foot infection ([place])   . Osteomyelitis ([place]) 05/25/2017  . Vitreous floaters of right eye 09/15/2016  . Non compliance w medication regimen 09/15/2016  . Depression 09/01/2016  . Foot ulcer due to secondary DM ([place]) 08/20/2016  . Hidradenitis suppurativa 10/13/2015  . Peripheral polyneuropathy 07/07/2015  . Coronary atherosclerosis of native coronary artery 08/17/2013  . Diabetic retinopathy ([place]) 02/02/2013  . Diabetic neuropathy ([place]) 12/01/2012  . Hypertension associated with diabetes ([place]) 08/03/2012  . Tobacco use disorder 08/03/2012  . Erectile dysfunction 08/03/2012  . Diabetes mellitus with neurological manifestations, uncontrolled ([place]) 05/26/2006   Past Medical History:  Diagnosis Date  . Diabetes mellitus type II, uncontrolled ([place])   . Hyperlipidemia   . Hypertension   . Other and unspecified angina pectoris   . Tobacco use     Family History  Problem Relation Age of Onset  . Cancer Mother   . Diabetes Father   . Hypertension Father     Past Surgical History:  Procedure Laterality Date  .  AMPUTATION Left 08/22/2016   Procedure: GREAT TOE AMPUTATION;  Surgeon: [name], MD;  Location: [facility];  Service: Orthopedics;  Laterality: Left;  . AMPUTATION Right 05/28/2017   Procedure: AMPUTATION 1st & 3rd TOE;  Surgeon: [name], MD;  Location: [facility];  Service: Orthopedics;  Laterality: Right;  . EYE SURGERY    . LEFT HEART CATHETERIZATION WITH CORONARY ANGIOGRAM N/A 07/31/2013   Procedure: LEFT HEART CATHETERIZATION WITH CORONARY ANGIOGRAM;  Surgeon: Jettie Booze, MD;  Location: [facility] C[facility] LAB;  Service: Cardiovascular;  Laterality: N/A;  . spinal tap    . toe amputated     Social History   Occupational History  . Occupation: Leisure centre manager: [employer]  Tobacco Use  . Smoking status: Current Every Day Smoker    Packs/day: 1.00    Years:  20.00    Pack years: 20.00    Types: Cigarettes  . Smokeless tobacco: Never Used  . Tobacco comment: wants patches   Substance and Sexual Activity  . Alcohol use: Not Currently    Comment: occ  . Drug use: No  . Sexual activity: Not Currently

## 2019-04-12 NOTE — Telephone Encounter (Signed)
Patient called back in today saying that he reached out to his PCP and they said they were unable to assist him because Dr. Kelton Pillar prescribed the Rx. I explained to patient that I think he was supposed top reach out to insurance company to find out what was covered. He stated that they were busy and he has been unsuccessfully with reaching them. I told him I will let the nurse know. He said ok and expressed that he is still without insulin.    Please advise

## 2019-04-13 NOTE — Telephone Encounter (Signed)
Correction [place], I called pt and lft vm to stop by and pick up sample and application.

## 2019-04-13 NOTE — Telephone Encounter (Signed)
Signed application has been faxed

## 2019-04-13 NOTE — Telephone Encounter (Signed)
Phone message from 04/12/19 was not sent to me, would you like to begin a PA for trulicity or change? I will also check to see if we have samples since pt stated that he is out of insulin,please advise.

## 2019-04-13 NOTE — Telephone Encounter (Signed)
Pt dropped off application when he picked up sample, placed it on Dr. Kelton Pillar desk for [employer]

## 2019-04-26 ENCOUNTER — Other Ambulatory Visit: Payer: Self-pay

## 2019-04-26 ENCOUNTER — Ambulatory Visit: Payer: Medicaid Other | Admitting: Internal Medicine

## 2019-04-26 ENCOUNTER — Encounter: Payer: Self-pay | Admitting: Internal Medicine

## 2019-04-26 DIAGNOSIS — E559 Vitamin D deficiency, unspecified: Secondary | ICD-10-CM

## 2019-04-26 DIAGNOSIS — E08621 Diabetes mellitus due to underlying condition with foot ulcer: Secondary | ICD-10-CM

## 2019-04-26 DIAGNOSIS — L97422 Non-pressure chronic ulcer of left heel and midfoot with fat layer exposed: Secondary | ICD-10-CM

## 2019-04-26 DIAGNOSIS — D509 Iron deficiency anemia, unspecified: Secondary | ICD-10-CM

## 2019-04-26 DIAGNOSIS — E538 Deficiency of other specified B group vitamins: Secondary | ICD-10-CM | POA: Diagnosis not present

## 2019-04-26 DIAGNOSIS — F321 Major depressive disorder, single episode, moderate: Secondary | ICD-10-CM

## 2019-04-26 MED ORDER — ESCITALOPRAM OXALATE 10 MG PO TABS: 10.0000 mg | ORAL_TABLET | Freq: Every day | ORAL | 1 refills | Status: DC

## 2019-04-26 MED ORDER — AMLODIPINE BESY-BENAZEPRIL HCL 5-20 MG PO CAPS: 1.0000 | ORAL_CAPSULE | Freq: Every day | ORAL | 3 refills | Status: DC

## 2019-04-26 NOTE — Assessment & Plan Note (Signed)
CBC

## 2019-04-26 NOTE — Patient Instructions (Signed)
Stop Lisinopril. Start Lotrel

## 2019-04-26 NOTE — Assessment & Plan Note (Signed)
Vit B12 

## 2019-04-26 NOTE — Assessment & Plan Note (Signed)
F/u w/Endo - Abby Nena Jordan, MD Tyler Aas Metformin

## 2019-04-26 NOTE — Progress Notes (Signed)
Subjective:  Patient ID: [name], male    DOB: November 21, 1970  Age: 49 y.o. MRN: [id number]  CC: No chief complaint on file.   HPI [name] presents for DM - Tyler Aas is not covered per pt... C/o insomnia, poor appetite, some depression Girlfriend has cerebral palsy - stress  Outpatient Medications Prior to Visit  Medication Sig Dispense Refill  . aspirin EC 81 MG tablet Take 81 mg by mouth daily.    Marland Kitchen b complex vitamins tablet Take 1 tablet by mouth daily. 100 tablet 3  . blood glucose meter kit and supplies KIT Dispense based on patient and insurance preference. Use up to four times daily as directed. (FOR ICD-9 250.00, 250.01). 1 each 0  . carvedilol (COREG) 12.5 MG tablet Take 1 tablet (12.5 mg total) by mouth 2 (two) times daily with a meal. 60 tablet 11  . Cholecalciferol (VITAMIN D3) 1.25 MG (50000 UT) CAPS Take 1 capsule by mouth once a week. 12 capsule 0  . Cholecalciferol (VITAMIN D3) 50 MCG (2000 UT) capsule Take 1 capsule (2,000 Units total) by mouth daily. 100 capsule 3  . citalopram (CELEXA) 20 MG tablet Take 1 tablet (20 mg total) by mouth daily. 90 tablet 3  . ferrous sulfate 325 (65 FE) MG tablet Take 1 tablet (325 mg total) by mouth daily. 90 tablet 1  . gabapentin (NEURONTIN) 300 MG capsule Take 1 capsule (300 mg total) by mouth 3 (three) times daily. Patient needs office visit before refills will be given 270 capsule 3  . insulin degludec (TRESIBA FLEXTOUCH) 100 UNIT/ML SOPN FlexTouch Pen Inject 0.2 mLs (20 Units total) into the skin daily. 18 mL 3  . Insulin Pen Needle 32G X 4 MM MISC 1 Device by Does not apply route daily. 100 each 3  . lisinopril (ZESTRIL) 20 MG tablet Take 1 tablet (20 mg total) by mouth daily. 90 tablet 3  . metFORMIN (GLUCOPHAGE) 500 MG tablet Take 1 tablet (500 mg total) by mouth 2 (two) times daily with a meal. 180 tablet 3  . mupirocin ointment (BACTROBAN) 2 % Apply 1 application topically 2 (two) times daily. 22 g 6  .  atorvastatin (LIPITOR) 20 MG tablet Take 1 tablet (20 mg total) by mouth daily. 90 tablet 3   No facility-administered medications prior to visit.    ROS: Review of Systems  Constitutional: Positive for fatigue. Negative for appetite change and unexpected weight change.  HENT: Negative for congestion, nosebleeds, sneezing, sore throat and trouble swallowing.   Eyes: Negative for itching and visual disturbance.  Respiratory: Negative for cough.   Cardiovascular: Negative for chest pain, palpitations and leg swelling.  Gastrointestinal: Negative for abdominal distention, blood in stool, diarrhea and nausea.  Genitourinary: Negative for frequency and hematuria.  Musculoskeletal: Negative for back pain, gait problem, joint swelling and neck pain.  Skin: Negative for rash.  Neurological: Negative for dizziness, tremors, speech difficulty and weakness.  Psychiatric/Behavioral: Positive for dysphoric mood and sleep disturbance. Negative for agitation and suicidal ideas. The patient is not nervous/anxious.     Objective:  BP (!) 178/112 (BP Location: Left Arm, Patient Position: Sitting, Cuff Size: Large)   Pulse 99   Temp 98.5 F (36.9 C) (Oral)   Ht '5\' 4"'$  (1.626 m)   Wt 200 lb (90.7 kg)   SpO2 99%   BMI 34.33 kg/m   BP Readings from Last 3 Encounters:  04/26/19 (!) 178/112  03/27/19 (!) 162/102  01/24/19 (!) 160/98  Wt Readings from Last 3 Encounters:  04/26/19 200 lb (90.7 kg)  04/10/19 200 lb (90.7 kg)  03/27/19 200 lb 12.8 oz (91.1 kg)    Physical Exam Constitutional:      General: He is not in acute distress.    Appearance: He is well-developed. He is obese.     Comments: NAD  Eyes:     Conjunctiva/sclera: Conjunctivae normal.     Pupils: Pupils are equal, round, and reactive to light.  Neck:     Thyroid: No thyromegaly.     Vascular: No JVD.  Cardiovascular:     Rate and Rhythm: Normal rate and regular rhythm.     Heart sounds: Normal heart sounds. No murmur.  No friction rub. No gallop.   Pulmonary:     Effort: Pulmonary effort is normal. No respiratory distress.     Breath sounds: Normal breath sounds. No wheezing or rales.  Chest:     Chest wall: No tenderness.  Abdominal:     General: Bowel sounds are normal. There is no distension.     Palpations: Abdomen is soft. There is no mass.     Tenderness: There is no abdominal tenderness. There is no guarding or rebound.  Musculoskeletal:        General: Deformity present. No tenderness. Normal range of motion.     Cervical back: Normal range of motion.  Lymphadenopathy:     Cervical: No cervical adenopathy.  Skin:    General: Skin is warm and dry.     Findings: No rash.  Neurological:     Mental Status: He is alert and oriented to person, place, and time.     Cranial Nerves: No cranial nerve deficit.     Motor: No abnormal muscle tone.     Coordination: Coordination normal.     Gait: Gait normal.     Deep Tendon Reflexes: Reflexes are normal and symmetric.  Psychiatric:        Behavior: Behavior normal.        Thought Content: Thought content normal.        Judgment: Judgment normal.   feet deformities  Lab Results  Component Value Date   WBC 11.7 (H) 10/17/2018   HGB 12.7 (L) 10/17/2018   HCT 39.8 10/17/2018   PLT 242.0 10/17/2018   GLUCOSE 198 (H) 01/24/2019   CHOL 126 10/17/2018   TRIG 90.0 10/17/2018   HDL 49.90 10/17/2018   LDLCALC 58 10/17/2018   ALT 9 01/24/2019   AST 8 01/24/2019   NA 136 01/24/2019   K 4.2 01/24/2019   CL 102 01/24/2019   CREATININE 1.63 (H) 01/24/2019   BUN 18 01/24/2019   CO2 28 01/24/2019   TSH 2.25 10/17/2018   PSA 0.75 10/17/2018   INR 1.1 (H) 07/26/2013   HGBA1C 12.4 (H) 01/24/2019   MICROALBUR 92.0 (H) 06/01/2017    No results found.  Assessment & Plan:    Follow-up: No follow-ups on file.  Walker Kehr, MD

## 2019-04-26 NOTE — Assessment & Plan Note (Signed)
Start Lexapro at hs

## 2019-04-26 NOTE — Assessment & Plan Note (Signed)
Vit D weekly

## 2019-05-08 ENCOUNTER — Other Ambulatory Visit: Payer: Self-pay

## 2019-05-08 ENCOUNTER — Encounter: Payer: Self-pay | Admitting: Orthopedic Surgery

## 2019-05-08 ENCOUNTER — Ambulatory Visit (INDEPENDENT_AMBULATORY_CARE_PROVIDER_SITE_OTHER): Payer: Medicaid Other | Admitting: Orthopedic Surgery

## 2019-05-08 VITALS — Ht 64.0 in | Wt 200.0 lb

## 2019-05-08 DIAGNOSIS — L97511 Non-pressure chronic ulcer of other part of right foot limited to breakdown of skin: Secondary | ICD-10-CM | POA: Diagnosis not present

## 2019-05-08 DIAGNOSIS — L97422 Non-pressure chronic ulcer of left heel and midfoot with fat layer exposed: Secondary | ICD-10-CM | POA: Diagnosis not present

## 2019-05-08 DIAGNOSIS — E08621 Diabetes mellitus due to underlying condition with foot ulcer: Secondary | ICD-10-CM

## 2019-05-08 NOTE — Progress Notes (Signed)
Office Visit Note   Patient: [name]           Date of Birth: 1970/12/06           MRN: [id number] Visit Date: 05/08/2019              Requested by: Cassandria Anger, MD [address] [place],  [place] [address] PCP: Plotnikov, Evie Lacks, MD  Chief Complaint  Patient presents with  . Left Foot - Follow-up  . Right Foot - Follow-up      HPI: Patient is a 49 year old gentleman presents in follow-up for Wagner grade 1 ulcers bilateral feet.  Patient is status post ray amputations with Charcot collapse.  Patient states he has been doing Bactroban dressing changes daily he has been full weightbearing he states he does have custom orthotics but is not wearing them today.  Assessment & Plan: Visit Diagnoses:  1. Non-pressure chronic ulcer of other part of right foot limited to breakdown of skin ([place])   2. Diabetic ulcer of left midfoot associated with diabetes mellitus due to underlying condition, with fat layer exposed ([place])     Plan: Recommended minimizing weightbearing continue with Bactroban and dry dressing changes daily.  Follow-Up Instructions: Return in about 3 weeks (around 05/29/2019).   Ortho Exam  Patient is alert, oriented, no adenopathy, well-dressed, normal affect, normal respiratory effort. Examination patient's ulcer on the left foot appears much larger.  The right foot ulcer appears stable.  After informed consent a 10 blade knife was used to debride the skin and soft tissue back to healthy viable tissue there is no exposed bone or tendon the left foot midfoot ulcer is 5 cm in diameter 5 mm deep silver nitrate was used hemostasis sterile dressing was applied.  Examination the right foot this also was debrided of skin and soft tissue back to healthy viable tissue this wound was 2 x 5 cm and 3 mm deep.  There is no exposed bone or tendon.  Sterile dressing was applied.  Imaging: No results found. No images are attached to the encounter.  Labs: Lab  Results  Component Value Date   HGBA1C 12.4 (H) 01/24/2019   HGBA1C 11.2 (H) 10/17/2018   HGBA1C 7.9 (H) 05/21/2018   ESRSEDRATE 54 (H) 08/20/2016   ESRSEDRATE 8 07/24/2012   ESRSEDRATE 4 04/19/2008   CRP 2.2 (H) 08/20/2016   REPTSTATUS 05/26/2018 FINAL 05/21/2018   GRAMSTAIN  07/24/2012    CYTOSPIN NO WBC SEEN NO ORGANISMS SEEN Gram Stain Report Called to,Read Back By and Verified With: R.[id number]/[email or web address]   CULT NO GROWTH 5 DAYS 05/21/2018     Lab Results  Component Value Date   ALBUMIN 3.8 01/24/2019   ALBUMIN 3.9 10/17/2018   ALBUMIN 3.6 06/05/2018   PREALBUMIN 18.9 05/21/2018    No results found for: MG Lab Results  Component Value Date   VD25OH 14.82 (L) 01/24/2019   VD25OH 12.27 (L) 10/17/2018   VD25OH 2.63 (L) 06/01/2017    Lab Results  Component Value Date   PREALBUMIN 18.9 05/21/2018   CBC EXTENDED Latest Ref Rng & Units 10/17/2018 06/05/2018 05/22/2018  WBC 4.0 - 10.5 K/uL 11.7(H) 10.7(H) 11.4(H)  RBC 4.22 - 5.81 Mil/uL 4.65 4.49 3.83(L)  HGB 13.0 - 17.0 g/dL 12.7(L) 12.2(L) 9.9(L)  HCT 39.0 - 52.0 % 39.8 40.8 33.3(L)  PLT 150.0 - 400.0 K/uL 242.0 272 264  NEUTROABS 1.4 - 7.7 K/uL 8.1(H) - -  LYMPHSABS 0.7 - 4.0 K/uL 2.8 - -  Body mass index is 34.33 kg/m.  Orders:  No orders of the defined types were placed in this encounter.  No orders of the defined types were placed in this encounter.    Procedures: No procedures performed  Clinical Data: No additional findings.  ROS:  All other systems negative, except as noted in the HPI. Review of Systems  Objective: Vital Signs: Ht 5\' 4"  (1.626 m)   Wt 200 lb (90.7 kg)   BMI 34.33 kg/m   Specialty Comments:  No specialty comments available.  PMFS History: Patient Active Problem List   Diagnosis Date Noted  . CRI (chronic renal insufficiency), stage 3 (moderate) 01/24/2019  . Obesity 01/24/2019  . 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[place]) 11/13/2018  . Diabetic foot ([place]) 10/17/2018  .  Non-pressure chronic ulcer of other part of right foot limited to breakdown of skin ([place]) 09/11/2018  . Type 2 diabetes mellitus with proliferative retinopathy of both eyes, without long-term current use of insulin ([place]) 07/14/2018  . Type 2 diabetes mellitus with hyperglycemia, without long-term current use of insulin ([place]) 07/14/2018  . Wound cellulitis 05/22/2018  . Osteomyelitis of foot, acute ([place]) 05/21/2018  . 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 Diabetic ulcer of left midfoot associated with diabetes mellitus due to underlying condition, with fat layer exposed ([place]) 08/26/2017  . B12 deficiency 06/15/2017  . Vitamin D deficiency 06/03/2017  . Iron deficiency anemia 06/01/2017  . Diabetic foot infection ([place])   . Osteomyelitis ([place]) 05/25/2017  . Vitreous floaters of right eye 09/15/2016  . Non compliance w medication regimen 09/15/2016  . Depression 09/01/2016  . Foot ulcer due to secondary DM ([place]) 08/20/2016  . Hidradenitis suppurativa 10/13/2015  . Peripheral polyneuropathy 07/07/2015  . Coronary atherosclerosis of native coronary artery 08/17/2013  . Diabetic retinopathy ([place]) 02/02/2013  . Diabetic neuropathy ([place]) 12/01/2012  . Hypertension associated with diabetes ([place]) 08/03/2012  . Tobacco use disorder 08/03/2012  . Erectile dysfunction 08/03/2012  . Diabetes mellitus with neurological manifestations, uncontrolled ([place]) 05/26/2006   Past Medical History:  Diagnosis Date  . Diabetes mellitus type II, uncontrolled ([place])   . Hyperlipidemia   . Hypertension   . Other and unspecified angina pectoris   . Tobacco use     Family History  Problem Relation Age of Onset  . Cancer Mother   . Diabetes Father   . Hypertension Father     Past Surgical History:  Procedure Laterality Date  . AMPUTATION Left 08/22/2016   Procedure: GREAT TOE AMPUTATION;  Surgeon: [name], MD;  Location: [facility];  Service: Orthopedics;  Laterality:  Left;  . AMPUTATION Right 05/28/2017   Procedure: AMPUTATION 1st & 3rd TOE;  Surgeon: [name], MD;  Location: [facility];  Service: Orthopedics;  Laterality: Right;  . EYE SURGERY    . LEFT HEART CATHETERIZATION WITH CORONARY ANGIOGRAM N/A 07/31/2013   Procedure: LEFT HEART CATHETERIZATION WITH CORONARY ANGIOGRAM;  Surgeon: Jettie Booze, MD;  Location[facility] CATH LAB;  Service: Cardiovascular;  Laterality: N/A;  . spinal tap    . toe amputated     Social History   Occupational History  . Occupation: Leisure centre manager: [employer]  Tobacco Use  . Smoking status: Current Every Day Smoker    Packs/day: 1.00    Years: 20.00    Pack years: 20.00    Types: Cigarettes  . Smokeless tobacco: Never Used  . Tobacco comment: wants patches  Substance and Sexual Activity  . Alcohol use: Not Currently    Comment: occ  . Drug use: No  . Sexual activity: Not Currently

## 2019-05-29 ENCOUNTER — Ambulatory Visit (INDEPENDENT_AMBULATORY_CARE_PROVIDER_SITE_OTHER): Payer: Medicaid Other | Admitting: Physician Assistant

## 2019-05-29 ENCOUNTER — Other Ambulatory Visit: Payer: Self-pay

## 2019-05-29 ENCOUNTER — Encounter: Payer: Self-pay | Admitting: Orthopedic Surgery

## 2019-05-29 VITALS — Ht 64.0 in | Wt 200.0 lb

## 2019-05-29 DIAGNOSIS — E11610 Type 2 diabetes mellitus with diabetic neuropathic arthropathy: Secondary | ICD-10-CM | POA: Diagnosis not present

## 2019-05-29 NOTE — Progress Notes (Signed)
Office Visit Note   Patient: [name]           Date of Birth: 02/17/1971           MRN: [id number] Visit Date: 05/29/2019              Requested by: Cassandria Anger, MD [address] [place],  [place] [address] PCP: Plotnikov, Evie Lacks, MD  Chief Complaint  Patient presents with  . Right Foot - Open Wound  . Left Foot - Open Wound      HPI: This is a pleasant gentleman for follow-up on his bilateral foot ulcers.  He has no drainage or bandage.  He does have thickened calluses on the plantar surface of his left foot and underneath the metatarsal heads on his right foot.  There is no surrounding cellulitis he feels well he is using orthotics  Assessment & Plan: Visit Diagnoses: No diagnosis found.  Plan: He will follow up in 1 month for reevaluation.  He is to use his orthotics.  Given his Charcot arthropathy he understands that he will not be able to do any type of weightbearing work.  I have also showed him some stretching of the Achilles especially for his right foot.  Follow-Up Instructions: No follow-ups on file.   Ortho Exam  Patient is alert, oriented, no adenopathy, well-dressed, normal affect, normal respiratory effort. Bilateral feet no erythema no drainage minimal swelling he has thickened calluses beneath this metatarsal heads on the right foot and on the plantar surface of the foot on the left.  With verbal permission these were debrided to a softer skin.  Imaging: No results found. No images are attached to the encounter.  Labs: Lab Results  Component Value Date   HGBA1C 12.4 (H) 01/24/2019   HGBA1C 11.2 (H) 10/17/2018   HGBA1C 7.9 (H) 05/21/2018   ESRSEDRATE 54 (H) 08/20/2016   ESRSEDRATE 8 07/24/2012   ESRSEDRATE 4 04/19/2008   CRP 2.2 (H) 08/20/2016   REPTSTATUS 05/26/2018 FINAL 05/21/2018   GRAMSTAIN  07/24/2012    CYTOSPIN NO WBC SEEN NO ORGANISMS SEEN Gram Stain Report Called to,Read Back By and Verified With:  R.[id number]   CULT NO GROWTH 5 DAYS 05/21/2018     Lab Results  Component Value Date   ALBUMIN 3.8 01/24/2019   ALBUMIN 3.9 10/17/2018   ALBUMIN 3.6 06/05/2018   PREALBUMIN 18.9 05/21/2018    No results found for: MG Lab Results  Component Value Date   VD25OH 14.82 (L) 01/24/2019   VD25OH 12.27 (L) 10/17/2018   VD25OH 2.63 (L) 06/01/2017    Lab Results  Component Value Date   PREALBUMIN 18.9 05/21/2018   CBC EXTENDED Latest Ref Rng & Units 10/17/2018 06/05/2018 05/22/2018  WBC 4.0 - 10.5 K/uL 11.7(H) 10.7(H) 11.4(H)  RBC 4.22 - 5.81 Mil/uL 4.65 4.49 3.83(L)  HGB 13.0 - 17.0 g/dL 12.7(L) 12.2(L) 9.9(L)  HCT 39.0 - 52.0 % 39.8 40.8 33.3(L)  PLT 150.0 - 400.0 K/uL 242.0 272 264  NEUTROABS 1.4 - 7.7 K/uL 8.1(H) - -  LYMPHSABS 0.7 - 4.0 K/uL 2.8 - -     Body mass index is 34.33 kg/m.  Orders:  No orders of the defined types were placed in this encounter.  No orders of the defined types were placed in this encounter.    Procedures: No procedures performed  Clinical Data: No additional findings.  ROS:  All other systems negative, except as noted in the HPI. Review of Systems  Objective: Vital Signs: Ht 5\' 4"  (1.626 m)   Wt 200 lb (90.7 kg)   BMI 34.33 kg/m   Specialty Comments:  No specialty comments available.  PMFS History: Patient Active Problem List   Diagnosis Date Noted  . CRI (chronic renal insufficiency), stage 3 (moderate) 01/24/2019  . Obesity 01/24/2019  . 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[place]) 11/13/2018  . Diabetic foot ([place]) 10/17/2018  . Non-pressure chronic ulcer of other part of right foot limited to breakdown of skin ([place]) 09/11/2018  . 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 Type 2 diabetes mellitus with hyperglycemia, without long-term current use of insulin ([place]) 07/14/2018  . Wound cellulitis 05/22/2018  . Osteomyelitis of foot, acute  ([place]) 05/21/2018  . 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 ([place])   . Diabetic ulcer of left midfoot associated with diabetes mellitus due to underlying condition, with fat layer exposed ([place]) 08/26/2017  . B12 deficiency 06/15/2017  . Vitamin D deficiency 06/03/2017  . Iron deficiency anemia 06/01/2017  . Diabetic foot infection ([place])   . Osteomyelitis ([place]) 05/25/2017  . Vitreous floaters of right eye 09/15/2016  . Non compliance w medication regimen 09/15/2016  . Depression 09/01/2016  . Foot ulcer due to secondary DM ([place]) 08/20/2016  . Hidradenitis suppurativa 10/13/2015  . Peripheral polyneuropathy 07/07/2015  . Coronary atherosclerosis of native coronary artery 08/17/2013  . Diabetic retinopathy ([place]) 02/02/2013  . Diabetic neuropathy ([place]) 12/01/2012  . Hypertension associated with diabetes ([place]) 08/03/2012  . Tobacco use disorder 08/03/2012  . Erectile dysfunction 08/03/2012  . Diabetes mellitus with neurological manifestations, uncontrolled ([place]) 05/26/2006   Past Medical History:  Diagnosis Date  . Diabetes mellitus type II, uncontrolled ([place])   . Hyperlipidemia   . Hypertension   . Other and unspecified angina pectoris   . Tobacco use     Family History  Problem Relation Age of Onset  . Cancer Mother   . Diabetes Father   . Hypertension Father     Past Surgical History:  Procedure Laterality Date  . AMPUTATION Left 08/22/2016   Procedure: GREAT TOE AMPUTATION;  Surgeon: [name], MD;  Location: [facility];  Service: Orthopedics;  Laterality: Left;  . AMPUTATION Right 05/28/2017   Procedure: AMPUTATION 1st & 3rd TOE;  Surgeon: [name], MD;  Location: [facility];  Service: Orthopedics;  Laterality: Right;  . EYE SURGERY    . LEFT HEART CATHETERIZATION WITH CORONARY ANGIOGRAM N/A 07/31/2013   Procedure: LEFT HEART CATHETERIZATION WITH CORONARY ANGIOGRAM;  Surgeon: Jettie Booze, MD;  Location: [facility] CATH LAB;  Service: Cardiovascular;   Laterality: N/A;  . spinal tap    . toe amputated     Social History   Occupational History  . Occupation: Leisure centre manager: [employer]  Tobacco Use  . Smoking status: Current Every Day Smoker    Packs/day: 1.00    Years: 20.00    Pack years: 20.00    Types: Cigarettes  . Smokeless tobacco: Never Used  . Tobacco comment: wants patches   Substance and Sexual Activity  . Alcohol use: Not Currently    Comment: occ  . Drug use: No  . Sexual activity: Not Currently

## 2019-06-26 ENCOUNTER — Ambulatory Visit (INDEPENDENT_AMBULATORY_CARE_PROVIDER_SITE_OTHER): Payer: Medicaid Other | Admitting: Orthopedic Surgery

## 2019-06-26 ENCOUNTER — Other Ambulatory Visit: Payer: Self-pay

## 2019-06-26 ENCOUNTER — Ambulatory Visit: Payer: Medicaid Other | Admitting: Internal Medicine

## 2019-06-26 ENCOUNTER — Encounter: Payer: Self-pay | Admitting: Orthopedic Surgery

## 2019-06-26 DIAGNOSIS — M86179 Other acute osteomyelitis, unspecified ankle and foot: Secondary | ICD-10-CM

## 2019-06-26 DIAGNOSIS — M86172 Other acute osteomyelitis, left ankle and foot: Secondary | ICD-10-CM

## 2019-06-26 DIAGNOSIS — E11610 Type 2 diabetes mellitus with diabetic neuropathic arthropathy: Secondary | ICD-10-CM | POA: Diagnosis not present

## 2019-06-26 DIAGNOSIS — L02612 Cutaneous abscess of left foot: Secondary | ICD-10-CM

## 2019-06-26 MED ORDER — DOXYCYCLINE HYCLATE 100 MG PO TABS: 100.0000 mg | ORAL_TABLET | Freq: Two times a day (BID) | ORAL | 0 refills | Status: DC

## 2019-06-26 NOTE — Progress Notes (Signed)
Office Visit Note   Patient: [name]           Date of Birth: 1970-05-28           MRN: [id number] Visit Date: 06/26/2019              Requested by: Cassandria Anger, MD [address] [place],  [place] [address] PCP: Plotnikov, Evie Lacks, MD  Chief Complaint  Patient presents with  . Left Foot - Follow-up  . Right Foot - Follow-up      HPI: Patient is a 49 year old gentleman who was seen in follow-up for both lower extremities.  Patient states that he has developed new purulent drainage from the mid aspect the left foot.  Patient notices a foul-smelling odor.  Assessment & Plan: Visit Diagnoses:  1. 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 ([place])   2. Cutaneous abscess of left foot   3. Osteomyelitis of foot, acute ([place])     Plan: Due to the exposed bone abscess and osteomyelitis of the left foot have recommended patient proceed with a transtibial amputation.  Risks and benefits and postoperative care was discussed.  Patient states he would like to wait until next week.  We will call a prescription for doxycycline.  Discussed that if he develops any systemic symptoms he would need to go to the emergency room urgently for urgent surgical intervention.  Discussed inpatient versus outpatient rehab.  Patient currently has an x-ray Shoe with double upright brace from [employer] and he will follow-up with [employer] for prosthetic evaluation fabrication.  Follow-Up Instructions: Return in about 2 weeks (around 07/10/2019).   Ortho Exam  Patient is alert, oriented, no adenopathy, well-dressed, normal affect, normal respiratory effort. Examination patient has good pulses in both feet he has 2 ulcers on the plantar aspect of the right foot.  After informed consent a 10 blade knife was used to breed the skin and soft tissue back to healthy viable granulation tissue beneath the 2nd-4th metatarsal heads.  The ulcer is 2 x 5 cm in diameter and 5 mm deep this  does not probe to bone or tendon the deep tissue was healthy and viable.  Examination of the left foot patient has a large ulcer on the mid plantar aspect the left foot.  The ulcer was debrided with a 10 blade knife there was purulent drainage the ulcer probes to bone.  The ulcer is 2 cm in diameter and 2 cm deep.  Patient's last hemoglobin A1c was uncontrolled at 12.4.  Imaging: No results found. No images are attached to the encounter.  Labs: Lab Results  Component Value Date   HGBA1C 12.4 (H) 01/24/2019   HGBA1C 11.2 (H) 10/17/2018   HGBA1C 7.9 (H) 05/21/2018   ESRSEDRATE 54 (H) 08/20/2016   ESRSEDRATE 8 07/24/2012   ESRSEDRATE 4 04/19/2008   CRP 2.2 (H) 08/20/2016   REPTSTATUS 05/26/2018 FINAL 05/21/2018   GRAMSTAIN  07/24/2012    CYTOSPIN NO WBC SEEN NO ORGANISMS SEEN Gram Stain Report Called to,Read Back By and Verified With: R.[id number]/[email or web address]   CULT NO GROWTH 5 DAYS 05/21/2018     Lab Results  Component Value Date   ALBUMIN 3.8 01/24/2019   ALBUMIN 3.9 10/17/2018   ALBUMIN 3.6 06/05/2018   PREALBUMIN 18.9 05/21/2018    No results found for: MG Lab Results  Component Value Date   VD25OH 14.82 (L) 01/24/2019   VD25OH 12.27 (L) 10/17/2018   VD25OH 2.63 (L) 06/01/2017  Lab Results  Component Value Date   PREALBUMIN 18.9 05/21/2018   CBC EXTENDED Latest Ref Rng & Units 10/17/2018 06/05/2018 05/22/2018  WBC 4.0 - 10.5 K/uL 11.7(H) 10.7(H) 11.4(H)  RBC 4.22 - 5.81 Mil/uL 4.65 4.49 3.83(L)  HGB 13.0 - 17.0 g/dL 12.7(L) 12.2(L) 9.9(L)  HCT 39.0 - 52.0 % 39.8 40.8 33.3(L)  PLT 150.0 - 400.0 K/uL 242.0 272 264  NEUTROABS 1.4 - 7.7 K/uL 8.1(H) - -  LYMPHSABS 0.7 - 4.0 K/uL 2.8 - -     There is no height or weight on file to calculate BMI.  Orders:  No orders of the defined types were placed in this encounter.  Meds ordered this encounter  Medications  . doxycycline (VIBRA-TABS) 100 MG tablet    Sig: Take 1 tablet (100 mg total) by mouth 2 (two)  times daily.    Dispense:  30 tablet    Refill:  0     Procedures: No procedures performed  Clinical Data: No additional findings.  ROS:  All other systems negative, except as noted in the HPI. Review of Systems  Objective: Vital Signs: There were no vitals taken for this visit.  Specialty Comments:  No specialty comments available.  PMFS History: Patient Active Problem List   Diagnosis Date Noted  . CRI (chronic renal insufficiency), stage 3 (moderate) 01/24/2019  . Obesity 01/24/2019  . 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[place]) 11/13/2018  . Diabetic foot ([place]) 10/17/2018  . Non-pressure chronic ulcer of other part of right foot limited to breakdown of skin ([place]) 09/11/2018  . Type 2 diabetes mellitus with proliferative retinopathy of both eyes, without long-term current use of insulin ([place]) 07/14/2018  . Type 2 diabetes mellitus with hyperglycemia, without long-term current use of insulin ([place]) 07/14/2018  . Wound cellulitis 05/22/2018  . Osteomyelitis of foot, acute ([place]) 05/21/2018  . 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 ([place])   . Diabetic ulcer of left midfoot associated with diabetes mellitus due to underlying condition, with fat layer exposed ([place]) 08/26/2017  . B12 deficiency 06/15/2017  . Vitamin D deficiency 06/03/2017  . Iron deficiency anemia 06/01/2017  . Diabetic foot infection ([place])   . Osteomyelitis ([place]) 05/25/2017  . Vitreous floaters of right eye 09/15/2016  . Non compliance w medication regimen 09/15/2016  . Depression 09/01/2016  . Foot ulcer due to secondary DM ([place]) 08/20/2016  . Hidradenitis suppurativa 10/13/2015  . Peripheral polyneuropathy 07/07/2015  . Coronary atherosclerosis of native coronary artery 08/17/2013  . Diabetic retinopathy ([place]) 02/02/2013  . Diabetic neuropathy ([place]) 12/01/2012  . Hypertension associated with diabetes ([place]) 08/03/2012  . Tobacco use disorder 08/03/2012  . Erectile dysfunction 08/03/2012  .  Diabetes mellitus with neurological manifestations, uncontrolled ([place]) 05/26/2006   Past Medical History:  Diagnosis Date  . Diabetes mellitus type II, uncontrolled ([place])   . Hyperlipidemia   . Hypertension   . Other and unspecified angina pectoris   . Tobacco use     Family History  Problem Relation Age of Onset  . Cancer Mother   . Diabetes Father   . Hypertension Father     Past Surgical History:  Procedure Laterality Date  . AMPUTATION Left 08/22/2016   Procedure: GREAT TOE AMPUTATION;  Surgeon: [name], MD;  Location: [facility];  Service: Orthopedics;  Laterality: Left;  . AMPUTATION Right 05/28/2017   Procedure: AMPUTATION 1st & 3rd TOE;  Surgeon: [name], MD;  Location: [facility];  Service: Orthopedics;  Laterality: Right;  .  EYE SURGERY    . LEFT HEART CATHETERIZATION WITH CORONARY ANGIOGRAM N/A 07/31/2013   Procedure: LEFT HEART CATHETERIZATION WITH CORONARY ANGIOGRAM;  Surgeon: Jettie Booze, MD;  Location: [facility] CATH LAB;  Service: Cardiovascular;  Laterality: N/A;  . spinal tap    . toe amputated     Social History   Occupational History  . Occupation: Leisure centre manager: [employer]  Tobacco Use  . Smoking status: Current Every Day Smoker    Packs/day: 1.00    Years: 20.00    Pack years: 20.00    Types: Cigarettes  . Smokeless tobacco: Never Used  . Tobacco comment: wants patches   Substance and Sexual Activity  . Alcohol use: Not Currently    Comment: occ  . Drug use: No  . Sexual activity: Not Currently

## 2019-06-28 ENCOUNTER — Ambulatory Visit (INDEPENDENT_AMBULATORY_CARE_PROVIDER_SITE_OTHER): Payer: Medicaid Other | Admitting: Internal Medicine

## 2019-06-28 ENCOUNTER — Other Ambulatory Visit: Payer: Self-pay

## 2019-06-28 ENCOUNTER — Ambulatory Visit (INDEPENDENT_AMBULATORY_CARE_PROVIDER_SITE_OTHER): Payer: Medicaid Other | Admitting: Podiatry

## 2019-06-28 ENCOUNTER — Encounter: Payer: Self-pay | Admitting: Internal Medicine

## 2019-06-28 ENCOUNTER — Ambulatory Visit (INDEPENDENT_AMBULATORY_CARE_PROVIDER_SITE_OTHER): Payer: Medicaid Other

## 2019-06-28 ENCOUNTER — Telehealth: Payer: Self-pay | Admitting: *Deleted

## 2019-06-28 VITALS — BP 196/102 | HR 109 | Temp 98.8°F | Ht 64.0 in | Wt 196.0 lb

## 2019-06-28 VITALS — BP 177/101 | HR 104 | Temp 99.1°F

## 2019-06-28 DIAGNOSIS — E538 Deficiency of other specified B group vitamins: Secondary | ICD-10-CM

## 2019-06-28 DIAGNOSIS — Z9114 Patient's other noncompliance with medication regimen: Secondary | ICD-10-CM

## 2019-06-28 DIAGNOSIS — M86172 Other acute osteomyelitis, left ankle and foot: Secondary | ICD-10-CM | POA: Diagnosis not present

## 2019-06-28 DIAGNOSIS — M86179 Other acute osteomyelitis, unspecified ankle and foot: Secondary | ICD-10-CM

## 2019-06-28 DIAGNOSIS — N183 Chronic kidney disease, stage 3 unspecified: Secondary | ICD-10-CM

## 2019-06-28 DIAGNOSIS — E113593 Type 2 diabetes mellitus with proliferative diabetic retinopathy without macular edema, bilateral: Secondary | ICD-10-CM | POA: Diagnosis not present

## 2019-06-28 DIAGNOSIS — E11628 Type 2 diabetes mellitus with other skin complications: Secondary | ICD-10-CM

## 2019-06-28 DIAGNOSIS — E559 Vitamin D deficiency, unspecified: Secondary | ICD-10-CM

## 2019-06-28 DIAGNOSIS — L089 Local infection of the skin and subcutaneous tissue, unspecified: Secondary | ICD-10-CM | POA: Diagnosis not present

## 2019-06-28 LAB — BASIC METABOLIC PANEL
BUN: 21 mg/dL (ref 6–23)
CO2: 28 mEq/L (ref 19–32)
Calcium: 8.8 mg/dL (ref 8.4–10.5)
Chloride: 103 mEq/L (ref 96–112)
Creatinine, Ser: 2.03 mg/dL — ABNORMAL HIGH (ref 0.40–1.50)
GFR: 42.32 mL/min — ABNORMAL LOW (ref >60.00)
Glucose, Bld: 214 mg/dL — ABNORMAL HIGH (ref 70–99)
Potassium: 4.1 mEq/L (ref 3.5–5.1)
Sodium: 138 mEq/L (ref 135–145)

## 2019-06-28 LAB — CBC WITH DIFFERENTIAL/PLATELET
Basophils Absolute: 0.1 10*3/uL (ref 0.0–0.1)
Basophils Relative: 0.9 % (ref 0.0–3.0)
Eosinophils Absolute: 0.2 10*3/uL (ref 0.0–0.7)
Eosinophils Relative: 1.3 % (ref 0.0–5.0)
HCT: 34.5 % — ABNORMAL LOW (ref 39.0–52.0)
Hemoglobin: 11.3 g/dL — ABNORMAL LOW (ref 13.0–17.0)
Lymphocytes Relative: 15.7 % (ref 12.0–46.0)
Lymphs Abs: 2.3 10*3/uL (ref 0.7–4.0)
MCHC: 32.8 g/dL (ref 30.0–36.0)
MCV: 84.3 fl (ref 78.0–100.0)
Monocytes Absolute: 0.9 10*3/uL (ref 0.1–1.0)
Monocytes Relative: 5.9 % (ref 3.0–12.0)
Neutro Abs: 11.3 10*3/uL — ABNORMAL HIGH (ref 1.4–7.7)
Neutrophils Relative %: 76.2 % (ref 43.0–77.0)
Platelets: 273 10*3/uL (ref 150.0–400.0)
RBC: 4.09 Mil/uL — ABNORMAL LOW (ref 4.22–5.81)
RDW: 15.1 % (ref 11.5–15.5)
WBC: 14.9 10*3/uL — ABNORMAL HIGH (ref 4.0–10.5)

## 2019-06-28 LAB — SEDIMENTATION RATE: Sed Rate: 80 mm/hr — ABNORMAL HIGH (ref 0–15)

## 2019-06-28 LAB — HEMOGLOBIN A1C: Hgb A1c MFr Bld: 9 % — ABNORMAL HIGH (ref 4.6–6.5)

## 2019-06-28 MED ORDER — CARVEDILOL 25 MG PO TABS: 25.0000 mg | ORAL_TABLET | Freq: Two times a day (BID) | ORAL | 11 refills | Status: DC

## 2019-06-28 NOTE — Assessment & Plan Note (Signed)
Ongoing issue

## 2019-06-28 NOTE — Assessment & Plan Note (Signed)
The patient states he is taking vitamin B12

## 2019-06-28 NOTE — Progress Notes (Signed)
  Subjective:  Patient ID: [name], male    DOB: Nov 15, 1970,  MRN: [id number]  Chief Complaint  Patient presents with  . Wound Check    L plantar midfoot. x1 yr. Second opinion. Pt stated, "I went in for my regular wound care 2 days ago. When the new doctor started scraping, a lot of pus came out. It hadn't had any symptoms/drainage before that. They're talking about amputating the foot. I have had streaks up my leg for the past month, but the wound only hurts when I walk on it - 6/10".   . Diabetes    Pt could not recall a recent HgbA1c result. He stated, "[My glucose has] been in the 200s because of this infection".    49 y.o. male presents for wound care. Hx confirmed with patient.  Objective:  Physical Exam: Wound Location: left midfoot Wound Measurement: 7.5x4.5 post-debridement Wound Base: Granular/Healthy Peri-wound: Macerated, Calloused Exudate: Scant/small amount Purulent exudate + induration     Radiographs:  X-ray of the left foot: no soft tissue emphysema, no signs of osteomyelitis. Charcot midfoot changes noted.  Vitals:   06/28/19 1530  BP: (!) 177/101  Pulse: (!) 104  Temp: 99.1 F (37.3 C)    Assessment:   1. Acute osteomyelitis of foot ([place])    Plan:  Patient was evaluated and treated and all questions answered.  Ulcer left midfoot. -Labs reviewed. Elevated ESR and WBC.  He is tachycardic as well.  -Will direct admit for IV abx given local purulence and failure of p.o. therapy. The purulence was superficial and did not extend deep. There was no probe to bone noted today. -The patient that I do think he has signs of a local infection but at this point I do not see definitive involvement of the bone or signs of osteomyelitis.  X-rays were taken today. No XR were available for comparison from Dr. Jess Barters evaluation.  -I discussed with the patient that based upon today's findings, his foot does appear salvageable.  We discussed that attempted salvage  would be severlely limited by his poor glycemic control. I gave no guarantees on the success of attempted salvage. -Offload ulcer with CAM boot -Wound cleansed and debrided as below -Plan for admission to the hospital for IV Abx. Would benefit from MRI for eval of OM.  -Would benefit from [facility] application and debridement in the OR.   Procedure: Excisional Debridement of Wound Rationale: Removal of non-viable soft tissue from the wound to promote healing.  Anesthesia: none Pre-Debridement Wound Measurements: 1.5 cm x 1.5 cm x 0.3 cm  Post-Debridement Wound Measurements: 7.5 cm x 4.5 cm x 0.3 cm  Type of Debridement: Sharp Excisional Tissue Removed: Non-viable soft tissue Depth of Debridement: subcutaneous tissue. Technique: Sharp excisional debridement to bleeding, viable wound base.  Dressing: Dry, sterile, compression dressing. Disposition: Patient tolerated procedure well. Patient to return in 1 week for follow-up.  No follow-ups on file.

## 2019-06-28 NOTE — Assessment & Plan Note (Addendum)
[name] is out of insulin. He has not been able to see Dr Kelton Pillar lately.  He understands that his diabetes control is very poor and that we are far apart from our goal hemoglobin A1c of 7.0 or less.  He understands that his poor diabetes control makes his prospect of recovering from foot osteomyelitis very unlikely.

## 2019-06-28 NOTE — Assessment & Plan Note (Signed)
BMET 

## 2019-06-28 NOTE — Assessment & Plan Note (Addendum)
The patient states that he is taking Vit d now.  Compliance encouraged

## 2019-06-28 NOTE — Telephone Encounter (Signed)
[facility] - Pt Placement Department-Sean states no current vacancies, may be late in the evening or tomorrow. I informed Dr. March Rummage and he stated he would like to continue for pt to go to [facility] to inpt Med/Surg for diabetic left foot infection. Hilliard Clark states he will contact pt as soon as possible. I informed pt that [facility] - Pt Placement would possibly call tomorrow or late today.

## 2019-06-28 NOTE — Assessment & Plan Note (Addendum)
[name] is aware that his foot infection is very serious.  He understands that his diabetes control is very poor and that we are far apart from our goal hemoglobin A1c of 7.0 or less.  He understands that his poor diabetes control makes his prospect of recovering from foot osteomyelitis very unlikely.  I very much agree with Dr. Jess Barters assessment and plan, however, I am not a surgeon.  [name] would like to see Dr. March Rummage ([facility]) for a second opinion and I will try to arrange for his consultation soon.  [name] understands that we do not have much time and if his condition deteriorates, he knows to go the emergency room ASAP. He was offered a transtibial amputation and I agree.

## 2019-06-28 NOTE — Progress Notes (Signed)
Subjective:  Patient ID: [name], male    DOB: December 23, 1970  Age: 49 y.o. MRN: [id number]  CC: No chief complaint on file.   HPI [name] presents for L foot infection. He was offered a transtibial amputation.  F/u DM, HTN, Vit d def.  He is out of insulin. He has not been able to see Dr Kelton Pillar in a follow-up for unclear reasons.   She has noticed discolored streaks on his left shin and calf about a month ago.  There is no pain or fever.  He is asking for a 2nd opinion re his foot . Per dr Jess Barters note on 06/26/19: "  HPI: Patient is a 49 year old gentleman who was seen in follow-up for both lower extremities.  Patient states that he has developed new purulent drainage from the mid aspect the left foot.  Patient notices a foul-smelling odor.  Assessment & Plan: Visit Diagnoses:  1. 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 ([place])   2. Cutaneous abscess of left foot   3. Osteomyelitis of foot, acute ([place])     Plan: Due to the exposed bone abscess and osteomyelitis of the left foot have recommended patient proceed with a transtibial amputation.  Risks and benefits and postoperative care was discussed.  Patient states he would like to wait until next week.  We will call a prescription for doxycycline.  Discussed that if he develops any systemic symptoms he would need to go to the emergency room urgently for urgent surgical intervention.  Discussed inpatient versus outpatient rehab.  Patient currently has an x-ray Shoe with double upright brace from [employer] and he will follow-up with [employer] for prosthetic evaluation fabrication.  Follow-Up Instructions: Return in about 2 weeks (around 07/10/2019).   Ortho Exam  Patient is alert, oriented, no adenopathy, well-dressed, normal affect, normal respiratory effort. Examination patient has good pulses in both feet he has 2 ulcers on the plantar aspect of the right foot.  After informed consent a  10 blade knife was used to breed the skin and soft tissue back to healthy viable granulation tissue beneath the 2nd-4th metatarsal heads.  The ulcer is 2 x 5 cm in diameter and 5 mm deep this does not probe to bone or tendon the deep tissue was healthy and viable.  Examination of the left foot patient has a large ulcer on the mid plantar aspect the left foot.  The ulcer was debrided with a 10 blade knife there was purulent drainage the ulcer probes to bone.  The ulcer is 2 cm in diameter and 2 cm deep.  Patient's last hemoglobin A1c was uncontrolled at 12.4."   Outpatient Medications Prior to Visit  Medication Sig Dispense Refill  . amLODipine-benazepril (LOTREL) 5-20 MG capsule Take 1 capsule by mouth daily. 90 capsule 3  . aspirin EC 81 MG tablet Take 81 mg by mouth daily.    Marland Kitchen b complex vitamins tablet Take 1 tablet by mouth daily. 100 tablet 3  . blood glucose meter kit and supplies KIT Dispense based on patient and insurance preference. Use up to four times daily as directed. (FOR ICD-9 250.00, 250.01). 1 each 0  . carvedilol (COREG) 12.5 MG tablet Take 1 tablet (12.5 mg total) by mouth 2 (two) times daily with a meal. 60 tablet 11  . Cholecalciferol (VITAMIN D3) 1.25 MG (50000 UT) CAPS Take 1 capsule by mouth once a week. 12 capsule 0  . Cholecalciferol (VITAMIN D3) 50 MCG (2000 UT)  capsule Take 1 capsule (2,000 Units total) by mouth daily. 100 capsule 3  . citalopram (CELEXA) 20 MG tablet Take 1 tablet (20 mg total) by mouth daily. 90 tablet 3  . doxycycline (VIBRA-TABS) 100 MG tablet Take 1 tablet (100 mg total) by mouth 2 (two) times daily. 30 tablet 0  . escitalopram (LEXAPRO) 10 MG tablet Take 1 tablet (10 mg total) by mouth at bedtime. 90 tablet 1  . ferrous sulfate 325 (65 FE) MG tablet Take 1 tablet (325 mg total) by mouth daily. 90 tablet 1  . gabapentin (NEURONTIN) 300 MG capsule Take 1 capsule (300 mg total) by mouth 3 (three) times daily. Patient needs office visit before  refills will be given 270 capsule 3  . insulin degludec (TRESIBA FLEXTOUCH) 100 UNIT/ML SOPN FlexTouch Pen Inject 0.2 mLs (20 Units total) into the skin daily. 18 mL 3  . Insulin Pen Needle 32G X 4 MM MISC 1 Device by Does not apply route daily. 100 each 3  . metFORMIN (GLUCOPHAGE) 500 MG tablet Take 1 tablet (500 mg total) by mouth 2 (two) times daily with a meal. 180 tablet 3  . mupirocin ointment (BACTROBAN) 2 % Apply 1 application topically 2 (two) times daily. 22 g 6  . atorvastatin (LIPITOR) 20 MG tablet Take 1 tablet (20 mg total) by mouth daily. 90 tablet 3   No facility-administered medications prior to visit.    ROS: Review of Systems  Constitutional: Positive for fatigue. Negative for appetite change and unexpected weight change.  HENT: Negative for congestion, nosebleeds, sneezing, sore throat and trouble swallowing.   Eyes: Negative for itching and visual disturbance.  Respiratory: Negative for cough.   Cardiovascular: Negative for chest pain, palpitations and leg swelling.  Gastrointestinal: Negative for abdominal distention, blood in stool, diarrhea and nausea.  Genitourinary: Negative for frequency and hematuria.  Musculoskeletal: Positive for gait problem. Negative for back pain, joint swelling and neck pain.  Skin: Positive for color change and wound. Negative for rash.  Neurological: Negative for dizziness, tremors, speech difficulty and weakness.  Psychiatric/Behavioral: Negative for agitation, dysphoric mood, sleep disturbance and suicidal ideas. The patient is not nervous/anxious.     Objective:  BP (!) 196/102 (BP Location: Left Arm, Patient Position: Sitting, Cuff Size: Large)   Pulse (!) 109   Temp 98.8 F (37.1 C) (Oral)   Ht '5\' 4"'$  (1.626 m)   Wt 196 lb (88.9 kg) Comment: wearing leg brace  SpO2 98%   BMI 33.64 kg/m   BP Readings from Last 3 Encounters:  06/28/19 (!) 196/102  04/26/19 (!) 178/112  03/27/19 (!) 162/102    Wt Readings from Last 3  Encounters:  06/28/19 196 lb (88.9 kg)  05/29/19 200 lb (90.7 kg)  05/08/19 200 lb (90.7 kg)    Physical Exam Constitutional:      General: He is not in acute distress.    Appearance: He is well-developed.     Comments: NAD  Eyes:     Conjunctiva/sclera: Conjunctivae normal.     Pupils: Pupils are equal, round, and reactive to light.  Neck:     Thyroid: No thyromegaly.     Vascular: No JVD.  Cardiovascular:     Rate and Rhythm: Normal rate and regular rhythm.     Heart sounds: Normal heart sounds. No murmur. No friction rub. No gallop.   Pulmonary:     Effort: Pulmonary effort is normal. No respiratory distress.     Breath sounds: Normal breath sounds.  No wheezing or rales.  Chest:     Chest wall: No tenderness.  Abdominal:     General: Bowel sounds are normal. There is no distension.     Palpations: Abdomen is soft. There is no mass.     Tenderness: There is no abdominal tenderness. There is no guarding or rebound.  Musculoskeletal:        General: Swelling and deformity present. No tenderness. Normal range of motion.     Cervical back: Normal range of motion.  Lymphadenopathy:     Cervical: No cervical adenopathy.  Skin:    General: Skin is warm and dry.     Findings: Lesion present. No rash.  Neurological:     Mental Status: He is alert and oriented to person, place, and time.     Cranial Nerves: No cranial nerve deficit.     Motor: No abnormal muscle tone.     Coordination: Coordination abnormal.     Gait: Gait abnormal.     Deep Tendon Reflexes: Reflexes are normal and symmetric.  Psychiatric:        Behavior: Behavior normal.        Thought Content: Thought content normal.        Judgment: Judgment normal.   Left foot with postop and Charcot deformities. L foot with a large deep ulcer in the bottom of his left foot. discolored streaks on his left shin and calf   I spent >40 minutes with the patient and more than 50% of time was spent in counseling and  coordination of care for his foot osteomyelitis. His wound was redressed.  Coban bandage applied.  Lab Results  Component Value Date   WBC 11.7 (H) 10/17/2018   HGB 12.7 (L) 10/17/2018   HCT 39.8 10/17/2018   PLT 242.0 10/17/2018   GLUCOSE 198 (H) 01/24/2019   CHOL 126 10/17/2018   TRIG 90.0 10/17/2018   HDL 49.90 10/17/2018   LDLCALC 58 10/17/2018   ALT 9 01/24/2019   AST 8 01/24/2019   NA 136 01/24/2019   K 4.2 01/24/2019   CL 102 01/24/2019   CREATININE 1.63 (H) 01/24/2019   BUN 18 01/24/2019   CO2 28 01/24/2019   TSH 2.25 10/17/2018   PSA 0.75 10/17/2018   INR 1.1 (H) 07/26/2013   HGBA1C 12.4 (H) 01/24/2019   MICROALBUR 92.0 (H) 06/01/2017    No results found.  Assessment & Plan:     Walker Kehr, MD

## 2019-06-29 ENCOUNTER — Encounter (HOSPITAL_COMMUNITY)
Admission: AD | Disposition: A | Discharge status: Home / Self Care | Payer: Self-pay | Source: Ambulatory Visit | Attending: Internal Medicine

## 2019-06-29 ENCOUNTER — Inpatient Hospital Stay (HOSPITAL_COMMUNITY): Payer: Medicaid Other

## 2019-06-29 ENCOUNTER — Inpatient Hospital Stay (HOSPITAL_COMMUNITY): Payer: Medicaid Other | Admitting: Anesthesiology

## 2019-06-29 ENCOUNTER — Encounter (HOSPITAL_COMMUNITY): Payer: Self-pay | Admitting: Internal Medicine

## 2019-06-29 ENCOUNTER — Inpatient Hospital Stay (HOSPITAL_COMMUNITY)
Admission: AD | Admit: 2019-06-29 | Discharge: 2019-07-02 | DRG: 624 | Disposition: A | Discharge status: Home / Self Care | Payer: Medicaid Other | Source: Ambulatory Visit | Attending: Internal Medicine | Admitting: Internal Medicine

## 2019-06-29 ENCOUNTER — Other Ambulatory Visit: Payer: Self-pay

## 2019-06-29 DIAGNOSIS — L97428 Non-pressure chronic ulcer of left heel and midfoot with other specified severity: Secondary | ICD-10-CM | POA: Diagnosis not present

## 2019-06-29 DIAGNOSIS — L97423 Non-pressure chronic ulcer of left heel and midfoot with necrosis of muscle: Secondary | ICD-10-CM | POA: Diagnosis not present

## 2019-06-29 DIAGNOSIS — L97422 Non-pressure chronic ulcer of left heel and midfoot with fat layer exposed: Secondary | ICD-10-CM | POA: Diagnosis not present

## 2019-06-29 DIAGNOSIS — L97429 Non-pressure chronic ulcer of left heel and midfoot with unspecified severity: Secondary | ICD-10-CM | POA: Diagnosis not present

## 2019-06-29 DIAGNOSIS — Z8249 Family history of ischemic heart disease and other diseases of the circulatory system: Secondary | ICD-10-CM | POA: Diagnosis not present

## 2019-06-29 DIAGNOSIS — N179 Acute kidney failure, unspecified: Secondary | ICD-10-CM | POA: Diagnosis not present

## 2019-06-29 DIAGNOSIS — N1831 Chronic kidney disease, stage 3a: Secondary | ICD-10-CM | POA: Diagnosis present

## 2019-06-29 DIAGNOSIS — E1140 Type 2 diabetes mellitus with diabetic neuropathy, unspecified: Secondary | ICD-10-CM | POA: Diagnosis present

## 2019-06-29 DIAGNOSIS — E1122 Type 2 diabetes mellitus with diabetic chronic kidney disease: Secondary | ICD-10-CM | POA: Diagnosis present

## 2019-06-29 DIAGNOSIS — Z809 Family history of malignant neoplasm, unspecified: Secondary | ICD-10-CM

## 2019-06-29 DIAGNOSIS — L0889 Other specified local infections of the skin and subcutaneous tissue: Secondary | ICD-10-CM

## 2019-06-29 DIAGNOSIS — E11628 Type 2 diabetes mellitus with other skin complications: Secondary | ICD-10-CM

## 2019-06-29 DIAGNOSIS — Z91030 Bee allergy status: Secondary | ICD-10-CM | POA: Diagnosis not present

## 2019-06-29 DIAGNOSIS — Z880 Allergy status to penicillin: Secondary | ICD-10-CM | POA: Diagnosis not present

## 2019-06-29 DIAGNOSIS — E11649 Type 2 diabetes mellitus with hypoglycemia without coma: Secondary | ICD-10-CM | POA: Diagnosis not present

## 2019-06-29 DIAGNOSIS — Z7982 Long term (current) use of aspirin: Secondary | ICD-10-CM

## 2019-06-29 DIAGNOSIS — E11621 Type 2 diabetes mellitus with foot ulcer: Secondary | ICD-10-CM | POA: Diagnosis present

## 2019-06-29 DIAGNOSIS — L089 Local infection of the skin and subcutaneous tissue, unspecified: Secondary | ICD-10-CM

## 2019-06-29 DIAGNOSIS — E785 Hyperlipidemia, unspecified: Secondary | ICD-10-CM | POA: Diagnosis present

## 2019-06-29 DIAGNOSIS — L97529 Non-pressure chronic ulcer of other part of left foot with unspecified severity: Secondary | ICD-10-CM | POA: Diagnosis present

## 2019-06-29 DIAGNOSIS — Z833 Family history of diabetes mellitus: Secondary | ICD-10-CM

## 2019-06-29 DIAGNOSIS — Z794 Long term (current) use of insulin: Secondary | ICD-10-CM

## 2019-06-29 DIAGNOSIS — F17210 Nicotine dependence, cigarettes, uncomplicated: Secondary | ICD-10-CM | POA: Diagnosis present

## 2019-06-29 DIAGNOSIS — I129 Hypertensive chronic kidney disease with stage 1 through stage 4 chronic kidney disease, or unspecified chronic kidney disease: Secondary | ICD-10-CM | POA: Diagnosis present

## 2019-06-29 DIAGNOSIS — Z20822 Contact with and (suspected) exposure to covid-19: Secondary | ICD-10-CM | POA: Diagnosis present

## 2019-06-29 HISTORY — PX: IRRIGATION AND DEBRIDEMENT FOOT: SHX6602

## 2019-06-29 LAB — COMPREHENSIVE METABOLIC PANEL
ALT: 15 U/L (ref 0–44)
AST: 11 U/L — ABNORMAL LOW (ref 15–41)
Albumin: 2.9 g/dL — ABNORMAL LOW (ref 3.5–5.0)
Alkaline Phosphatase: 78 U/L (ref 38–126)
Anion gap: 8 (ref 5–15)
BUN: 18 mg/dL (ref 6–20)
CO2: 25 mmol/L (ref 22–32)
Calcium: 8.5 mg/dL — ABNORMAL LOW (ref 8.9–10.3)
Chloride: 107 mmol/L (ref 98–111)
Creatinine, Ser: 1.94 mg/dL — ABNORMAL HIGH (ref 0.61–1.24)
GFR calc Af Amer: 46 mL/min — ABNORMAL LOW (ref >60)
GFR calc non Af Amer: 39 mL/min — ABNORMAL LOW (ref >60)
Glucose, Bld: 171 mg/dL — ABNORMAL HIGH (ref 70–99)
Potassium: 4 mmol/L (ref 3.5–5.1)
Sodium: 140 mmol/L (ref 135–145)
Total Bilirubin: 0.4 mg/dL (ref 0.3–1.2)
Total Protein: 7.3 g/dL (ref 6.5–8.1)

## 2019-06-29 LAB — CBC WITH DIFFERENTIAL/PLATELET
Abs Immature Granulocytes: 0.06 10*3/uL (ref 0.00–0.07)
Basophils Absolute: 0 10*3/uL (ref 0.0–0.1)
Basophils Relative: 0 %
Eosinophils Absolute: 0.2 10*3/uL (ref 0.0–0.5)
Eosinophils Relative: 2 %
HCT: 34.7 % — ABNORMAL LOW (ref 39.0–52.0)
Hemoglobin: 10.7 g/dL — ABNORMAL LOW (ref 13.0–17.0)
Immature Granulocytes: 0 %
Lymphocytes Relative: 21 %
Lymphs Abs: 2.8 10*3/uL (ref 0.7–4.0)
MCH: 27.2 pg (ref 26.0–34.0)
MCHC: 30.8 g/dL (ref 30.0–36.0)
MCV: 88.1 fL (ref 80.0–100.0)
Monocytes Absolute: 0.7 10*3/uL (ref 0.1–1.0)
Monocytes Relative: 5 %
Neutro Abs: 9.8 10*3/uL — ABNORMAL HIGH (ref 1.7–7.7)
Neutrophils Relative %: 72 %
Platelets: 303 10*3/uL (ref 150–400)
RBC: 3.94 MIL/uL — ABNORMAL LOW (ref 4.22–5.81)
RDW: 14.4 % (ref 11.5–15.5)
WBC: 13.7 10*3/uL — ABNORMAL HIGH (ref 4.0–10.5)
nRBC: 0 % (ref 0.0–0.2)

## 2019-06-29 LAB — RESPIRATORY PANEL BY RT PCR (FLU A&B, COVID)
Influenza A by PCR: NEGATIVE
Influenza B by PCR: NEGATIVE
SARS Coronavirus 2 by RT PCR: NEGATIVE

## 2019-06-29 LAB — GLUCOSE, CAPILLARY
Glucose-Capillary: 140 mg/dL — ABNORMAL HIGH (ref 70–99)
Glucose-Capillary: 54 mg/dL — ABNORMAL LOW (ref 70–99)
Glucose-Capillary: 61 mg/dL — ABNORMAL LOW (ref 70–99)
Glucose-Capillary: 131 mg/dL — ABNORMAL HIGH (ref 70–99)
Glucose-Capillary: 71 mg/dL (ref 70–99)
Glucose-Capillary: 93 mg/dL (ref 70–99)

## 2019-06-29 LAB — HEMOGLOBIN A1C
Hgb A1c MFr Bld: 8.8 % — ABNORMAL HIGH (ref 4.8–5.6)
Mean Plasma Glucose: 205.86 mg/dL

## 2019-06-29 LAB — TSH: TSH: 1.77 u[IU]/mL (ref 0.350–4.500)

## 2019-06-29 LAB — C-REACTIVE PROTEIN: CRP: 8.1 mg/dL — ABNORMAL HIGH (ref <1.0)

## 2019-06-29 LAB — HIV ANTIBODY (ROUTINE TESTING W REFLEX): HIV Screen 4th Generation wRfx: NONREACTIVE

## 2019-06-29 LAB — SARS CORONAVIRUS 2 (TAT 6-24 HRS): SARS Coronavirus 2: NEGATIVE

## 2019-06-29 LAB — MRSA PCR SCREENING: MRSA by PCR: NEGATIVE

## 2019-06-29 LAB — SEDIMENTATION RATE: Sed Rate: 71 mm/hr — ABNORMAL HIGH (ref 0–16)

## 2019-06-29 SURGERY — IRRIGATION AND DEBRIDEMENT FOOT
Anesthesia: Monitor Anesthesia Care | Site: Foot | Laterality: Left

## 2019-06-29 MED ORDER — POLYETHYLENE GLYCOL 3350 17 G PO PACK: 17.0000 g | PACK | Freq: Every day | ORAL | Status: DC | PRN

## 2019-06-29 MED ORDER — DEXTROSE 50 % IV SOLN
INTRAVENOUS | Status: AC
  Administered 2019-06-29: 25 mL via INTRAVENOUS
  Filled 2019-06-29: qty 50

## 2019-06-29 MED ORDER — GABAPENTIN 300 MG PO CAPS
300.0000 mg | ORAL_CAPSULE | Freq: Three times a day (TID) | ORAL | Status: DC
  Administered 2019-06-29 – 2019-07-02 (×9): 300 mg via ORAL
  Filled 2019-06-29 (×9): qty 1

## 2019-06-29 MED ORDER — ATORVASTATIN CALCIUM 10 MG PO TABS
20.0000 mg | ORAL_TABLET | Freq: Every day | ORAL | Status: DC
  Administered 2019-06-29 – 2019-07-02 (×4): 20 mg via ORAL
  Filled 2019-06-29 (×4): qty 2

## 2019-06-29 MED ORDER — BUPIVACAINE HCL (PF) 0.5 % IJ SOLN
INTRAMUSCULAR | Status: AC
  Filled 2019-06-29: qty 30

## 2019-06-29 MED ORDER — FERROUS SULFATE 325 (65 FE) MG PO TABS
325.0000 mg | ORAL_TABLET | Freq: Every day | ORAL | Status: DC
  Administered 2019-06-30 – 2019-07-02 (×3): 325 mg via ORAL
  Filled 2019-06-29 (×3): qty 1

## 2019-06-29 MED ORDER — VITAMIN D (ERGOCALCIFEROL) 1.25 MG (50000 UNIT) PO CAPS: 50000.0000 [IU] | ORAL_CAPSULE | ORAL | Status: DC

## 2019-06-29 MED ORDER — DOCUSATE SODIUM 100 MG PO CAPS: 100.0000 mg | ORAL_CAPSULE | Freq: Every day | ORAL | Status: DC | PRN

## 2019-06-29 MED ORDER — ONDANSETRON HCL 4 MG/2ML IJ SOLN
INTRAMUSCULAR | Status: AC
  Filled 2019-06-29: qty 2

## 2019-06-29 MED ORDER — PROPOFOL 1000 MG/100ML IV EMUL
INTRAVENOUS | Status: AC
  Filled 2019-06-29: qty 100

## 2019-06-29 MED ORDER — LACTATED RINGERS IV SOLN: INTRAVENOUS | Status: DC

## 2019-06-29 MED ORDER — ALBUTEROL SULFATE (2.5 MG/3ML) 0.083% IN NEBU: 2.5000 mg | INHALATION_SOLUTION | RESPIRATORY_TRACT | Status: DC | PRN

## 2019-06-29 MED ORDER — GADOBUTROL 1 MMOL/ML IV SOLN
10.0000 mL | Freq: Once | INTRAVENOUS | Status: AC
  Administered 2019-06-29: 8 mL via INTRAVENOUS

## 2019-06-29 MED ORDER — PROPOFOL 500 MG/50ML IV EMUL
INTRAVENOUS | Status: DC | PRN
  Administered 2019-06-29: 25 ug/kg/min via INTRAVENOUS

## 2019-06-29 MED ORDER — INSULIN ASPART 100 UNIT/ML ~~LOC~~ SOLN
0.0000 [IU] | Freq: Three times a day (TID) | SUBCUTANEOUS | Status: DC
  Administered 2019-06-29 – 2019-06-30 (×2): 2 [IU] via SUBCUTANEOUS

## 2019-06-29 MED ORDER — FENTANYL CITRATE (PF) 100 MCG/2ML IJ SOLN
INTRAMUSCULAR | Status: AC
  Filled 2019-06-29: qty 2

## 2019-06-29 MED ORDER — MIDAZOLAM HCL 5 MG/5ML IJ SOLN
INTRAMUSCULAR | Status: DC | PRN
  Administered 2019-06-29: 2 mg via INTRAVENOUS

## 2019-06-29 MED ORDER — ONDANSETRON HCL 4 MG/2ML IJ SOLN: 4.0000 mg | Freq: Four times a day (QID) | INTRAMUSCULAR | Status: DC | PRN

## 2019-06-29 MED ORDER — HYDROMORPHONE HCL 1 MG/ML IJ SOLN: 0.2500 mg | INTRAMUSCULAR | Status: DC | PRN

## 2019-06-29 MED ORDER — HYDROCODONE-ACETAMINOPHEN 5-325 MG PO TABS: 1.0000 | ORAL_TABLET | ORAL | Status: DC | PRN

## 2019-06-29 MED ORDER — VANCOMYCIN HCL 1000 MG IV SOLR
INTRAVENOUS | Status: DC | PRN
  Administered 2019-06-29: 1000 mg

## 2019-06-29 MED ORDER — ACETAMINOPHEN 10 MG/ML IV SOLN: 1000.0000 mg | Freq: Once | INTRAVENOUS | Status: DC | PRN

## 2019-06-29 MED ORDER — DEXTROSE 50 % IV SOLN: 25.0000 mL | Freq: Once | INTRAVENOUS | Status: AC

## 2019-06-29 MED ORDER — LACTATED RINGERS IV SOLN: INTRAVENOUS | Status: DC | PRN

## 2019-06-29 MED ORDER — INSULIN ASPART 100 UNIT/ML ~~LOC~~ SOLN
6.0000 [IU] | Freq: Three times a day (TID) | SUBCUTANEOUS | Status: DC
  Administered 2019-06-29 – 2019-07-01 (×4): 6 [IU] via SUBCUTANEOUS

## 2019-06-29 MED ORDER — FENTANYL CITRATE (PF) 100 MCG/2ML IJ SOLN
INTRAMUSCULAR | Status: DC | PRN
  Administered 2019-06-29: 25 ug via INTRAVENOUS
  Administered 2019-06-29: 100 ug via INTRAVENOUS
  Administered 2019-06-29: 50 ug via INTRAVENOUS

## 2019-06-29 MED ORDER — ESCITALOPRAM OXALATE 10 MG PO TABS
10.0000 mg | ORAL_TABLET | Freq: Every day | ORAL | Status: DC
  Administered 2019-06-30 – 2019-07-02 (×3): 10 mg via ORAL
  Filled 2019-06-29 (×3): qty 1

## 2019-06-29 MED ORDER — CARVEDILOL 25 MG PO TABS
25.0000 mg | ORAL_TABLET | ORAL | Status: AC
  Administered 2019-06-29: 25 mg via ORAL
  Filled 2019-06-29: qty 1

## 2019-06-29 MED ORDER — ACETAMINOPHEN 650 MG RE SUPP: 650.0000 mg | Freq: Four times a day (QID) | RECTAL | Status: DC | PRN

## 2019-06-29 MED ORDER — ACETAMINOPHEN 325 MG PO TABS
650.0000 mg | ORAL_TABLET | Freq: Four times a day (QID) | ORAL | Status: DC | PRN
  Administered 2019-06-29: 650 mg via ORAL
  Filled 2019-06-29: qty 2

## 2019-06-29 MED ORDER — VANCOMYCIN HCL 2000 MG/400ML IV SOLN
2000.0000 mg | Freq: Once | INTRAVENOUS | Status: AC
  Administered 2019-06-29: 2000 mg via INTRAVENOUS
  Filled 2019-06-29: qty 400

## 2019-06-29 MED ORDER — PROMETHAZINE HCL 25 MG/ML IJ SOLN: 6.2500 mg | INTRAMUSCULAR | Status: DC | PRN

## 2019-06-29 MED ORDER — AMLODIPINE BESYLATE 5 MG PO TABS
5.0000 mg | ORAL_TABLET | Freq: Every day | ORAL | Status: DC
  Administered 2019-06-29 – 2019-06-30 (×2): 5 mg via ORAL
  Filled 2019-06-29 (×2): qty 1

## 2019-06-29 MED ORDER — INSULIN GLARGINE 100 UNIT/ML ~~LOC~~ SOLN
20.0000 [IU] | Freq: Every day | SUBCUTANEOUS | Status: DC
  Administered 2019-06-29 – 2019-07-02 (×4): 20 [IU] via SUBCUTANEOUS
  Filled 2019-06-29 (×4): qty 0.2

## 2019-06-29 MED ORDER — BENAZEPRIL HCL 20 MG PO TABS
20.0000 mg | ORAL_TABLET | Freq: Every day | ORAL | Status: DC
  Filled 2019-06-29: qty 1

## 2019-06-29 MED ORDER — SODIUM CHLORIDE 0.9 % IV SOLN: INTRAVENOUS | Status: AC

## 2019-06-29 MED ORDER — SODIUM CHLORIDE 0.9% FLUSH
3.0000 mL | Freq: Two times a day (BID) | INTRAVENOUS | Status: DC
  Administered 2019-06-30 – 2019-07-02 (×4): 3 mL via INTRAVENOUS

## 2019-06-29 MED ORDER — AMLODIPINE BESY-BENAZEPRIL HCL 5-20 MG PO CAPS: 1.0000 | ORAL_CAPSULE | Freq: Every day | ORAL | Status: DC

## 2019-06-29 MED ORDER — CARVEDILOL 25 MG PO TABS
25.0000 mg | ORAL_TABLET | Freq: Two times a day (BID) | ORAL | Status: DC
  Administered 2019-06-30 – 2019-07-02 (×5): 25 mg via ORAL
  Filled 2019-06-29 (×5): qty 1

## 2019-06-29 MED ORDER — SODIUM CHLORIDE 0.9 % IV SOLN
2.0000 g | Freq: Two times a day (BID) | INTRAVENOUS | Status: DC
  Administered 2019-06-29 – 2019-07-02 (×7): 2 g via INTRAVENOUS
  Filled 2019-06-29 (×7): qty 2

## 2019-06-29 MED ORDER — VANCOMYCIN HCL 1000 MG IV SOLR
INTRAVENOUS | Status: AC
  Filled 2019-06-29: qty 1000

## 2019-06-29 MED ORDER — HEPARIN SODIUM (PORCINE) 5000 UNIT/ML IJ SOLN
5000.0000 [IU] | Freq: Three times a day (TID) | INTRAMUSCULAR | Status: DC
  Administered 2019-06-29 – 2019-06-30 (×2): 5000 [IU] via SUBCUTANEOUS
  Filled 2019-06-29 (×2): qty 1

## 2019-06-29 MED ORDER — SODIUM CHLORIDE 0.9 % IR SOLN
Status: DC | PRN
  Administered 2019-06-29: 3000 mL

## 2019-06-29 MED ORDER — MEPERIDINE HCL 50 MG/ML IJ SOLN: 6.2500 mg | INTRAMUSCULAR | Status: DC | PRN

## 2019-06-29 MED ORDER — VANCOMYCIN HCL 1250 MG/250ML IV SOLN
1250.0000 mg | INTRAVENOUS | Status: DC
  Administered 2019-06-30: 1250 mg via INTRAVENOUS
  Filled 2019-06-29 (×2): qty 250

## 2019-06-29 MED ORDER — CITALOPRAM HYDROBROMIDE 20 MG PO TABS: 20.0000 mg | ORAL_TABLET | Freq: Every day | ORAL | Status: DC

## 2019-06-29 MED ORDER — BUPIVACAINE HCL (PF) 0.5 % IJ SOLN
INTRAMUSCULAR | Status: DC | PRN
  Administered 2019-06-29: 10 mL

## 2019-06-29 MED ORDER — MIDAZOLAM HCL 2 MG/2ML IJ SOLN
INTRAMUSCULAR | Status: AC
  Filled 2019-06-29: qty 2

## 2019-06-29 SURGICAL SUPPLY — 51 items
APL PRP STRL LF DISP 70% ISPRP (MISCELLANEOUS) ×1
BLADE HEX COATED 2.75 (ELECTRODE) ×2 IMPLANT
BLADE SURG 15 STRL LF DISP TIS (BLADE) ×1 IMPLANT
BLADE SURG 15 STRL SS (BLADE) ×2
BNDG CMPR 9X4 STRL LF SNTH (GAUZE/BANDAGES/DRESSINGS)
BNDG ELASTIC 3X5.8 VLCR STR LF (GAUZE/BANDAGES/DRESSINGS) ×2 IMPLANT
BNDG ELASTIC 4X5.8 VLCR STR LF (GAUZE/BANDAGES/DRESSINGS) ×1 IMPLANT
BNDG ESMARK 4X9 LF (GAUZE/BANDAGES/DRESSINGS) ×1 IMPLANT
BNDG GAUZE ELAST 4 BULKY (GAUZE/BANDAGES/DRESSINGS) ×2 IMPLANT
CHLORAPREP W/TINT 26 (MISCELLANEOUS) ×2 IMPLANT
COVER BACK TABLE 60X90IN (DRAPES) ×1 IMPLANT
COVER WAND RF STERILE (DRAPES) IMPLANT
CUFF TOURN SGL QUICK 18X4 (TOURNIQUET CUFF) IMPLANT
DRAPE EXTREMITY T 121X128X90 (DISPOSABLE) ×2 IMPLANT
DRAPE IMP U-DRAPE 54X76 (DRAPES) ×2 IMPLANT
DRAPE U-SHAPE 47X51 STRL (DRAPES) ×1 IMPLANT
DRSG EMULSION OIL 3X3 NADH (GAUZE/BANDAGES/DRESSINGS) ×2 IMPLANT
DRSG PAD ABDOMINAL 8X10 ST (GAUZE/BANDAGES/DRESSINGS) IMPLANT
ELECT REM PT RETURN 15FT ADLT (MISCELLANEOUS) ×2 IMPLANT
GAUZE 4X4 16PLY RFD (DISPOSABLE) IMPLANT
GAUZE SPONGE 4X4 12PLY STRL (GAUZE/BANDAGES/DRESSINGS) ×2 IMPLANT
GLOVE BIO SURGEON STRL SZ7.5 (GLOVE) ×2 IMPLANT
GLOVE BIOGEL PI IND STRL 8 (GLOVE) ×1 IMPLANT
GLOVE BIOGEL PI INDICATOR 8 (GLOVE) ×1
GOWN STRL REUS W/ TWL LRG LVL3 (GOWN DISPOSABLE) ×1 IMPLANT
GOWN STRL REUS W/ TWL XL LVL3 (GOWN DISPOSABLE) ×1 IMPLANT
GOWN STRL REUS W/TWL LRG LVL3 (GOWN DISPOSABLE) ×2
GOWN STRL REUS W/TWL XL LVL3 (GOWN DISPOSABLE) ×2
KIT BASIN OR (CUSTOM PROCEDURE TRAY) ×2 IMPLANT
MANIFOLD NEPTUNE II (INSTRUMENTS) ×2 IMPLANT
NDL HYPO 25X1 1.5 SAFETY (NEEDLE) ×1 IMPLANT
NEEDLE HYPO 25X1 1.5 SAFETY (NEEDLE) ×2 IMPLANT
NS IRRIG 1000ML POUR BTL (IV SOLUTION) IMPLANT
PACK ORTHO EXTREMITY (CUSTOM PROCEDURE TRAY) ×2 IMPLANT
PADDING CAST ABS 4INX4YD NS (CAST SUPPLIES)
PADDING CAST ABS COTTON 4X4 ST (CAST SUPPLIES) ×1 IMPLANT
PENCIL SMOKE EVACUATOR (MISCELLANEOUS) IMPLANT
SET IRRIG Y TYPE TUR BLADDER L (SET/KITS/TRAYS/PACK) IMPLANT
SLEEVE SCD COMPRESS KNEE MED (MISCELLANEOUS) ×2 IMPLANT
STOCKINETTE 8 INCH (MISCELLANEOUS) ×3 IMPLANT
SUT ETHILON 4 0 PS 2 18 (SUTURE) IMPLANT
SUT MNCRL AB 3-0 PS2 18 (SUTURE) IMPLANT
SUT MNCRL AB 4-0 PS2 18 (SUTURE) IMPLANT
SUT MON AB 5-0 PS2 18 (SUTURE) IMPLANT
SUT VIC AB 3-0 FS2 27 (SUTURE) ×2 IMPLANT
SUT VICRYL 4-0 PS2 18IN ABS (SUTURE) ×1 IMPLANT
SYR BULB 3OZ (MISCELLANEOUS) ×1 IMPLANT
SYR CONTROL 10ML LL (SYRINGE) ×2 IMPLANT
TOWEL OR 17X26 10 PK STRL BLUE (TOWEL DISPOSABLE) ×2 IMPLANT
UNDERPAD 30X36 HEAVY ABSORB (UNDERPADS AND DIAPERS) ×2 IMPLANT
YANKAUER SUCT BULB TIP NO VENT (SUCTIONS) ×1 IMPLANT

## 2019-06-29 NOTE — H&P (Signed)
History and Physical    [name]:[id number] DOB: 1970/12/10 DOA: 06/29/2019  PCP: Cassandria Anger, MD  Patient coming from:  Home in referral from podiatry office on 06/28/2019  I have personally briefly reviewed patient's old medical records in [place]  Chief Complaint:  Left diabetic foot ucler with concern for osteomyelitis  HPI: [name] is a 49 y.o. 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 Patient was seen by Dr Sharol Given on 3/30 diagnosed with infected ulcer left foot ulcer with concern for osteomyelitis. At that time patient was started on oral doxycycline with plans for further intervention.  Patient however desired a second opinion and followed up with  primary care on 4/1 and was referred to [name], patients podiatrist.  S/p podiatry evaluation patient was referred for admission for IV antibiotics , further imaging with MRI with plans for aggressive woundcare and further surgical intervention.  Currently patient states, when he initially presented  For care on 3/30 he only noted patient associated with left foot plantar ulcer. He noted no drainage, or redness. ON further ROS patient denies any fever/ chills/ nausea/vomiting,chest pain, sob, palpitations presyncope, cough , dysuria. He notes no weight loss , but notes decrease appetite.    ED Course: N/A   Review of Systems: As per HPI otherwise 10 point review of systems negative.   Past Medical History:  Diagnosis Date  . Diabetes mellitus type II, uncontrolled ([place])   . Hyperlipidemia   . Hypertension   . Other and unspecified angina pectoris   . Tobacco use      Past Surgical History:  Procedure Laterality Date  . AMPUTATION Left 08/22/2016   Procedure: GREAT TOE AMPUTATION;  Surgeon: [name], MD;  Location: [facility];  Service: Orthopedics;  Laterality: Left;  . AMPUTATION Right 05/28/2017   Procedure: AMPUTATION 1st & 3rd TOE;  Surgeon: [name], MD;  Location: [facility];  Service: Orthopedics;  Laterality: Right;  . EYE SURGERY    . LEFT HEART CATHETERIZATION WITH CORONARY ANGIOGRAM N/A 07/31/2013   Procedure: LEFT HEART CATHETERIZATION WITH CORONARY ANGIOGRAM;  Surgeon: Jettie Booze, MD;  Location: [facility] CATH LAB;  Service: Cardiovascular;  Laterality: N/A;  . spinal tap    . toe amputated       reports that he has been smoking cigarettes. He has a 20.00 pack-year smoking history. He has never used smokeless tobacco. He reports previous alcohol use. He reports that he does not use drugs.  Allergies  Allergen Reactions  . Bee Venom Swelling    SWELLING REACTION UNSPECIFIED   . Penicillins Hives and Other (See Comments)    PATIENT HAS HAD A PCN REACTION WITH IMMEDIATE RASH, FACIAL/TONGUE/THROAT SWELLING, SOB, OR LIGHTHEADEDNESS WITH HYPOTENSION:  #  #  #  YES  #  #  #   Has patient had a PCN reaction causing severe rash involving mucus membranes or skin necrosis: No Has patient had a PCN reaction that required hospitalization: No Has patient had a PCN reaction occurring within the last 10 years: No Allergy Severity noted prior to 05/28/17     Family History  Problem Relation Age of Onset  .  Cancer Mother   . Diabetes Father   . Hypertension Father     Prior to Admission medications   Medication Sig Start Date End Date Taking? Authorizing Provider  amLODipine-benazepril (LOTREL) 5-20 MG capsule Take 1 capsule by mouth daily. 04/26/19  Yes Plotnikov, Evie Lacks, MD  aspirin EC 81 MG tablet Take 81 mg by mouth daily. 02/11/16  Yes [provider]  atorvastatin (LIPITOR) 20 MG tablet Take 1 tablet (20 mg total) by mouth  daily. 10/17/18 06/29/19 Yes Plotnikov, Evie Lacks, MD  b complex vitamins tablet Take 1 tablet by mouth daily. 10/18/18  Yes Plotnikov, Evie Lacks, MD  carvedilol (COREG) 25 MG tablet Take 1 tablet (25 mg total) by mouth 2 (two) times daily. 06/28/19 06/27/20 Yes Plotnikov, Evie Lacks, MD  Cholecalciferol (VITAMIN D3) 1.25 MG (50000 UT) CAPS Take 1 capsule by mouth once a week. 01/26/19  Yes Plotnikov, Evie Lacks, MD  Cholecalciferol (VITAMIN D3) 50 MCG (2000 UT) capsule Take 1 capsule (2,000 Units total) by mouth daily. 10/18/18  Yes Plotnikov, Evie Lacks, MD  doxycycline (VIBRA-TABS) 100 MG tablet Take 1 tablet (100 mg total) by mouth 2 (two) times daily. 06/26/19  Yes [name], MD  escitalopram (LEXAPRO) 10 MG tablet Take 1 tablet (10 mg total) by mouth at bedtime. 04/26/19  Yes Plotnikov, Evie Lacks, MD  ferrous sulfate 325 (65 FE) MG tablet Take 1 tablet (325 mg total) by mouth daily. 10/17/18 10/17/19 Yes Plotnikov, Evie Lacks, MD  gabapentin (NEURONTIN) 300 MG capsule Take 1 capsule (300 mg total) by mouth 3 (three) times daily. Patient needs office visit before refills will be given 10/17/18  Yes Plotnikov, Evie Lacks, MD  insulin degludec (TRESIBA FLEXTOUCH) 100 UNIT/ML SOPN FlexTouch Pen Inject 0.2 mLs (20 Units total) into the skin daily. 04/04/19  Yes Shamleffer, Melanie Crazier, MD  metFORMIN (GLUCOPHAGE) 500 MG tablet Take 1 tablet (500 mg total) by mouth 2 (two) times daily with a meal. 10/17/18  Yes Plotnikov, Evie Lacks, MD  blood glucose meter kit and supplies KIT Dispense based on patient and insurance preference. Use up to four times daily as directed. (FOR ICD-9 250.00, 250.01). 08/25/16   Minus Liberty, MD  citalopram (CELEXA) 20 MG tablet Take 1 tablet (20 mg total) by mouth daily. Patient not taking: Reported on 06/29/2019 10/17/18 10/17/19  Plotnikov, Evie Lacks, MD  Insulin Pen Needle 32G X 4 MM MISC 1 Device by Does not apply route daily. 03/27/19   Shamleffer, Melanie Crazier, MD  mupirocin  ointment (BACTROBAN) 2 % Apply 1 application topically 2 (two) times daily. 03/13/19   Persons, Bevely Palmer, [place]    Physical Exam: Vitals:   06/29/19 0952 06/29/19 0957  BP: (!) 169/98 (!) 169/98  Pulse: 96 95  Resp: 18 18  Temp: 98.6 F (37 C) 98.6 F (37 C)  TempSrc: Oral Oral  SpO2: 100% 99%    Constitutional: NAD, calm, comfortable Vitals:   06/29/19 0952 06/29/19 0957  BP: (!) 169/98 (!) 169/98  Pulse: 96 95  Resp: 18 18  Temp: 98.6 F (37 C) 98.6 F (37 C)  TempSrc: Oral Oral  SpO2: 100% 99%   Eyes: PERRL, lids and conjunctivae normal ENMT: Mucous membranes are moist. Posterior pharynx clear of any exudate or lesions.Normal dentition.  Neck: normal, supple, no masses, no thyromegaly Respiratory: clear to auscultation bilaterally, no wheezing, no crackles. Normal respiratory effort. No accessory muscle use.  Cardiovascular: Regular rate and rhythm, no murmurs / rubs / gallops. No  extremity edema. warm  Abdomen: no tenderness, no masses palpated. No hepatosplenomegaly. Bowel sounds positive.  Musculoskeletal: no clubbing / cyanosis. No joint deformity upper and lower extremities b/l toe amputations. Good ROM, no contractures. Normal muscle tone.  Skin: no rashes, lesions, Left plantar ulcer no odor noted, +drainage on dressing, no expanding cellulitis  Neurologic: CN 2-12 grossly intact. Sensation intact, DTR normal. Strength 5/5 in all 4.  Psychiatric: Normal judgment and insight. Alert and oriented x 3. Normal mood.    Labs on Admission: I have personally reviewed following labs and imaging studies  CBC: Recent Labs  Lab 06/28/19 1010  WBC 14.9*  NEUTROABS 11.3*  HGB 11.3*  HCT 34.5*  MCV 84.3  PLT 644.0   Basic Metabolic Panel: Recent Labs  Lab 06/28/19 1010  NA 138  K 4.1  CL 103  CO2 28  GLUCOSE 214*  BUN 21  CREATININE 2.03*  CALCIUM 8.8   GFR: Estimated Creatinine Clearance: 44.3 mL/min (A) (by C-G formula based on SCr of 2.03 mg/dL  (H)). Liver Function Tests: No results for input(s): AST, ALT, ALKPHOS, BILITOT, PROT, ALBUMIN in the last 168 hours. No results for input(s): LIPASE, AMYLASE in the last 168 hours. No results for input(s): AMMONIA in the last 168 hours. Coagulation Profile: No results for input(s): INR, PROTIME in the last 168 hours. Cardiac Enzymes: No results for input(s): CKTOTAL, CKMB, CKMBINDEX, TROPONINI in the last 168 hours. BNP (last 3 results) No results for input(s): PROBNP in the last 8760 hours. HbA1C: Recent Labs    06/28/19 1010  HGBA1C 9.0*   CBG: No results for input(s): GLUCAP in the last 168 hours. Lipid Profile: No results for input(s): CHOL, HDL, LDLCALC, TRIG, CHOLHDL, LDLDIRECT in the last 72 hours. Thyroid Function Tests: No results for input(s): TSH, T4TOTAL, FREET4, T3FREE, THYROIDAB in the last 72 hours. Anemia Panel: No results for input(s): VITAMINB12, FOLATE, FERRITIN, TIBC, IRON, RETICCTPCT in the last 72 hours. Urine analysis:    Component Value Date/Time   COLORURINE YELLOW 10/17/2018 1441   APPEARANCEUR CLEAR 10/17/2018 1441   LABSPEC >=1.030 (A) 10/17/2018 1441   PHURINE 5.0 10/17/2018 1441   GLUCOSEU >=1000 (A) 10/17/2018 1441   HGBUR MODERATE (A) 10/17/2018 1441   BILIRUBINUR NEGATIVE 10/17/2018 1441   KETONESUR NEGATIVE 10/17/2018 1441   PROTEINUR >=300 (A) 05/25/2017 1300   UROBILINOGEN 0.2 10/17/2018 1441   NITRITE NEGATIVE 10/17/2018 1441   LEUKOCYTESUR NEGATIVE 10/17/2018 1441    Radiological Exams on Admission: DG Foot Complete Left  Result Date: 06/28/2019 Please see detailed radiograph report in office note.   EKG: Independently reviewed. Pending.   Assessment/Plan  Left diabetic foot ulcer with concern for Acute Osteomyelitis  -MRI  -broad spectrum iv abx  -wound care  - podiatry consult  -prn pain medications    Mild AKI on CRI  - ivfs  - hold nephrotoxic medications ( holding ace)   DMII insulin dependent uncontrolled   -resume lantus 20 units daily  - patient states he was loss to follow up with Endocrinologist and has been of insulin x 2 months  - A1c 9  -insulin sliding scale finger sticks per hospital protocol  -diabetic educator consult   HTN - not at goal  -resume home medications as able  - titrate home medications as need  HLD -continue statin   Tobacco abuse  1/2 pack per day  Patient goal is for complete cessation  Currently does not want nicotine patch   Fen:  Stable replete  prn    DVT prophylaxis: heparin Code Status: FULL Family Communication: N/A Disposition Plan:  Per podiatry  Consults called:[name] , Podiatry  Admission status:inpatient   Clance Boll MD [facility] If 7PM-7AM, please contact night-coverage [email or web address] Password [facility]  06/29/2019, 10:37 AM

## 2019-06-29 NOTE — Progress Notes (Signed)
Pharmacy Antibiotic Note  [name] is a 49 y.o. male admitted on 06/29/2019 with diabetic foot infection.  MRI ordered to rule out osteomyelitis. Pharmacy has been consulted for Vancomycin & Cefepime dosing. WBC elevated at 13.7, Afebrile. Scr elevated 1.94 (est CrCl ~52ml/min)  Plan: Cefepime 2gm IV q12h Vancomycin 2g IV x1 followed by 1250mg  IV q24h to target AUC 400-550 Monitor renal function and cx data  Check Vancomycin levels at steady-state     Temp (24hrs), Avg:98.8 F (37.1 C), Min:98.6 F (37 C), Max:99.1 F (37.3 C)  Recent Labs  Lab 06/28/19 1010 06/29/19 1048  WBC 14.9* 13.7*  CREATININE 2.03*  --     Estimated Creatinine Clearance: 44.3 mL/min (A) (by C-G formula based on SCr of 2.03 mg/dL (H)).    Allergies  Allergen Reactions  . Bee Venom Swelling    SWELLING REACTION UNSPECIFIED   . Penicillins Hives and Other (See Comments)    PATIENT HAS HAD A PCN REACTION WITH IMMEDIATE RASH, FACIAL/TONGUE/THROAT SWELLING, SOB, OR LIGHTHEADEDNESS WITH HYPOTENSION:  #  #  #  YES  #  #  #   Has patient had a PCN reaction causing severe rash involving mucus membranes or skin necrosis: No Has patient had a PCN reaction that required hospitalization: No Has patient had a PCN reaction occurring within the last 10 years: No Allergy Severity noted prior to 05/28/17     Antimicrobials this admission: 4/2 Cefepime >>  4/2 Vancomycin >>   Dose adjustments this admission:  Microbiology results:  Thank you for allowing pharmacy to be a part of this patient's care.  Netta Cedars PharmD, BCPS 06/29/2019 11:04 AM

## 2019-06-29 NOTE — Plan of Care (Signed)
Nutrition Education Note RD consulted for nutrition education regarding diabetes.   Lab Results  Component Value Date   HGBA1C 8.8 (H) 06/29/2019   Met with patient at bedside this afternoon. He reports ongoing poor appetite/intake over the past 2-3 months. He stated that he just has not had an appetite and will snack on things like chips, crackers, and drink fruit juices, but usually does not eat a meal. He reports typically having a decent appetite, 1-2 meals/day and stated that he does not like many vegetables, reports only eating cabbage or green beans.   RD provided "Carbohydrate Counting for People with Diabetes" handout from the Academy of [employer] and Dietetics. Discussed different food groups and their effects on blood sugar, emphasizing carbohydrate-containing foods. Provided list of carbohydrates and recommended serving sizes of common foods.  Discussed importance of controlled and consistent carbohydrate intake throughout the day. Provided examples of ways to balance meals/snacks and encouraged intake of high-fiber, whole grain complex carbohydrates. Teach back method used.  Expect fair compliance.  There is no height or weight on file to calculate BMI. Pt meets criteria for obese based on current BMI.  Current diet order is NPO, will monitor for diet advancement and provide nutrition supplements as appropriate (pt would prefer strawberry or vanilla flavored supplements). Labs and medications reviewed. No further nutrition interventions warranted at this time. RD contact information provided. If additional nutrition issues arise, please re-consult RD.  Lajuan Lines, RD, LDN Clinical Nutrition After Hours/Weekend Pager # in [phone]

## 2019-06-29 NOTE — Progress Notes (Addendum)
Subjective:  Patient ID: [name], male    DOB: 1970/04/01,  MRN: [id number]  Seen in pre-op. Patient denies complaints.  Objective:   Vitals:   06/29/19 0957 06/29/19 1800  BP: (!) 169/98 (!) 184/81  Pulse: 95 95  Resp: 18 18  Temp: 98.6 F (37 C) 98.7 F (37.1 C)  SpO2: 99% 100%   General AA&O x3. Normal mood and affect.  Vascular Dorsalis pedis and posterior tibial pulses 2/4 bilat. Brisk capillary refill to all digits. Pedal hair present.  Neurologic Epicritic sensation grossly intact.  Dermatologic Wound dressed LLE. No ascending erythema.  Orthopedic: MMT 5/5 in dorsiflexion, plantarflexion, inversion, and eversion. Normal joint ROM without pain or crepitus.   Results for orders placed or performed during the hospital encounter of 06/29/19 (from the past 24 hour(s))  HIV Antibody (routine testing w rflx)     Status: None   Collection Time: 06/29/19 10:48 AM  Result Value Ref Range   HIV Screen 4th Generation wRfx NON REACTIVE NON REACTIVE  Comprehensive metabolic panel     Status: Abnormal   Collection Time: 06/29/19 10:48 AM  Result Value Ref Range   Sodium 140 135 - 145 mmol/L   Potassium 4.0 3.5 - 5.1 mmol/L   Chloride 107 98 - 111 mmol/L   CO2 25 22 - 32 mmol/L   Glucose, Bld 171 (H) 70 - 99 mg/dL   BUN 18 6 - 20 mg/dL   Creatinine, Ser 1.94 (H) 0.61 - 1.24 mg/dL   Calcium 8.5 (L) 8.9 - 10.3 mg/dL   Total Protein 7.3 6.5 - 8.1 g/dL   Albumin 2.9 (L) 3.5 - 5.0 g/dL   AST 11 (L) 15 - 41 U/L   ALT 15 0 - 44 U/L   Alkaline Phosphatase 78 38 - 126 U/L   Total Bilirubin 0.4 0.3 - 1.2 mg/dL   GFR calc non Af Amer 39 (L) >60 mL/min   GFR calc Af Amer 46 (L) >60 mL/min   Anion gap 8 5 - 15  CBC WITH DIFFERENTIAL     Status: Abnormal   Collection Time: 06/29/19 10:48 AM  Result Value Ref Range   WBC 13.7 (H) 4.0 - 10.5 K/uL   RBC 3.94 (L) 4.22 - 5.81 MIL/uL   Hemoglobin 10.7 (L) 13.0 - 17.0 g/dL   HCT 34.7 (L) 39.0 - 52.0 %   MCV 88.1 80.0 - 100.0 fL     MCH 27.2 26.0 - 34.0 pg   MCHC 30.8 30.0 - 36.0 g/dL   RDW 14.4 11.5 - 15.5 %   Platelets 303 150 - 400 K/uL   nRBC 0.0 0.0 - 0.2 %   Neutrophils Relative % 72 %   Neutro Abs 9.8 (H) 1.7 - 7.7 K/uL   Lymphocytes Relative 21 %   Lymphs Abs 2.8 0.7 - 4.0 K/uL   Monocytes Relative 5 %   Monocytes Absolute 0.7 0.1 - 1.0 K/uL   Eosinophils Relative 2 %   Eosinophils Absolute 0.2 0.0 - 0.5 K/uL   Basophils Relative 0 %   Basophils Absolute 0.0 0.0 - 0.1 K/uL   Immature Granulocytes 0 %   Abs Immature Granulocytes 0.06 0.00 - 0.07 K/uL  TSH     Status: None   Collection Time: 06/29/19 10:48 AM  Result Value Ref Range   TSH 1.770 0.350 - 4.500 uIU/mL  Hemoglobin A1c     Status: Abnormal   Collection Time: 06/29/19 10:48 AM  Result Value Ref Range  Hgb A1c MFr Bld 8.8 (H) 4.8 - 5.6 %   Mean Plasma Glucose 205.86 mg/dL  C-reactive protein     Status: Abnormal   Collection Time: 06/29/19 10:48 AM  Result Value Ref Range   CRP 8.1 (H) <1.0 mg/dL  Sedimentation rate     Status: Abnormal   Collection Time: 06/29/19 10:48 AM  Result Value Ref Range   Sed Rate 71 (H) 0 - 16 mm/hr  Glucose, capillary     Status: Abnormal   Collection Time: 06/29/19 11:36 AM  Result Value Ref Range   Glucose-Capillary 140 (H) 70 - 99 mg/dL  MRSA PCR Screening     Status: None   Collection Time: 06/29/19 12:04 PM   Specimen: Nasal Mucosa; Nasopharyngeal  Result Value Ref Range   MRSA by PCR NEGATIVE NEGATIVE  SARS CORONAVIRUS 2 (TAT 6-24 HRS) Nasopharyngeal Nasopharyngeal Swab     Status: None   Collection Time: 06/29/19 12:04 PM   Specimen: Nasopharyngeal Swab  Result Value Ref Range   SARS Coronavirus 2 NEGATIVE NEGATIVE  Respiratory Panel by RT PCR (Flu A&B, Covid) - Nasopharyngeal Swab     Status: None   Collection Time: 06/29/19  4:26 PM   Specimen: Nasopharyngeal Swab  Result Value Ref Range   SARS Coronavirus 2 by RT PCR NEGATIVE NEGATIVE   Influenza A by PCR NEGATIVE NEGATIVE    Influenza B by PCR NEGATIVE NEGATIVE  Glucose, capillary     Status: Abnormal   Collection Time: 06/29/19  6:10 PM  Result Value Ref Range   Glucose-Capillary 54 (L) 70 - 99 mg/dL  Glucose, capillary     Status: Abnormal   Collection Time: 06/29/19  6:12 PM  Result Value Ref Range   Glucose-Capillary 61 (L) 70 - 99 mg/dL  Glucose, capillary     Status: Abnormal   Collection Time: 06/29/19  6:38 PM  Result Value Ref Range   Glucose-Capillary 131 (H) 70 - 99 mg/dL    Assessment & Plan:  Patient was evaluated and treated and all questions answered.  Diabetic Foot Ulcer Left With Infection -Labs reviewed. Leukocytosis, slightly decreased from yesterday. -MRI reviewed. No signs of deep abscess or osteo. Stable Charcot changes. -Proceed with debridement and VAC application tonight. Discussed with patient proceeding with these based upon MRI findings. Patient agreed to proceed.  -Continue ABx -Will continue to follow.  [name], DPM  Accessible via secure chat for questions or concerns.

## 2019-06-29 NOTE — Op Note (Signed)
Patient Name: [name] DOB: [birth year]  MRN: [id number]   Date of Service: 06/29/2019  Surgeon: Dr. Hardie Pulley, DPM Assistants: None Pre-operative Diagnosis:  Diabetic ulcer left foot Post-operative Diagnosis:  * No Diagnosis Codes entered * Procedures:  1) Debridement left foot ulcer  2) Application of wound VAC Pathology/Specimens: * No specimens in log * Anesthesia: MAC/local Hemostasis: * Missing tourniquet times found for documented tourniquets in log: [id number] * Estimated Blood Loss: 10 ml Materials: * No implants in log * Medications: 1g Vancomycin topical Complications: none  Indications for Procedure:  This is a 49 y.o. male with a chornic LLE wound. He recently had signs of infection. BKA was recommended. HE declined and sought second opinion. The wound did have signs of local infection but without bone exposure. MRI was performed and did not suggest signs of OM. Patient opted for limb saving approach and we did discuss that this was reasonable given the findings.    Procedure in Detail: Patient was identified in pre-operative holding area. Formal consent was signed and the left lower extremity was marked. Patient was brought back to the operating room. Anesthesia was induced. The extremity was prepped and draped in the usual sterile fashion. Timeout was taken to confirm patient name, laterality, and procedure prior to incision.   Attention was then directed to the left foot. There was a wound measuring 7.5 x 3 to the plantar left foot. The wound was sharply excisionally debrided to bleeding fascial tissue. The wound measured 8x3 post-debridement. A black sponge for a wound VAC was then applied and adhered with transparent dressing. IT was set to 125 mm Hg with good seal noted.  The foot was then dressed with 4x4, kerlix, ACE bandage. Patient tolerated the procedure well.   Disposition: Following a period of post-operative monitoring, patient will be transferred back  to the floor.

## 2019-06-29 NOTE — Plan of Care (Signed)

## 2019-06-29 NOTE — Progress Notes (Signed)
Patient arrived to 1514 from PACU, no c/o pain per patient. Asking for food. A X O X 4 slightly drowsy. Vitals stable. Dressing intact, wound vac running. Patient oriented to room and staff. WIll continue to monitor.

## 2019-06-29 NOTE — Transfer of Care (Signed)
Immediate Anesthesia Transfer of Care Note  Patient: [name]  Procedure(s) Performed: IRRIGATION AND DEBRIDEMENT FOOT application wound vac (Left Foot)  Patient Location: PACU  Anesthesia Type:MAC  Level of Consciousness: awake, alert , oriented and patient cooperative  Airway & Oxygen Therapy: Patient Spontanous Breathing and Patient connected to face mask oxygen  Post-op Assessment: Report given to RN, Post -op Vital signs reviewed and stable and Patient moving all extremities X 4  Post vital signs: stable  Last Vitals:  Vitals Value Taken Time  BP 144/89 06/29/19 2019  Temp    Pulse 81 06/29/19 2021  Resp 16 06/29/19 2021  SpO2 100 % 06/29/19 2021  Vitals shown include unvalidated device data.  Last Pain:  Vitals:   06/29/19 1800  TempSrc: Oral  PainSc: 0-No pain         Complications: No apparent anesthesia complications

## 2019-06-29 NOTE — Progress Notes (Signed)
Inpatient Diabetes Program Recommendations  AACE/ADA: New Consensus Statement on Inpatient Glycemic Control (2015)  Target Ranges:  Prepandial:   less than 140 mg/dL      Peak postprandial:   less than 180 mg/dL (1-2 hours)      Critically ill patients:  140 - 180 mg/dL   Lab Results  Component Value Date   GLUCAP 140 (H) 06/29/2019   HGBA1C 8.8 (H) 06/29/2019    Review of Glycemic Control Results for [name], [name] (MRN [id number]) as of 06/29/2019 15:39  Ref. Range 01/24/2019 14:42 06/28/2019 10:10  Hemoglobin A1C Latest Ref Range: 4.6 - 6.5 % 12.4 (H) 9.0 (H)   Diabetes history: DM 2 Outpatient Diabetes medications: Tresiba 20 units Daily, Metformin 500 mg bid Current orders for Inpatient glycemic control:  Lantus 20 units Daily Novolog 0-15 units tid Novolog 6 units tid  Inpatient Diabetes Program Recommendations:    Spoke with pt at bedside regarding A1c and glucose control at home. Pt was taking Tresiba 20 units and metformin at home. Pt was given Tresiba samples from his Endocrinologist and filled out prior authorization forms for it at the time of his appointment on 03/27/19. Pt had been rationing his insulin.  Pt's A1c has improved from 12.4% to 9% this admission. Pt has [employer] and should be able to afford either Lantus or Levemir insulins.   Spoke with pt about consistently checking glucose at home. Pt has plenty of glucose testing supplies at home.  Tried calling pt's Endocrinologist, Dr. Kelton Pillar at [name]. Office closed today.  At time of d/c MD to please write prescription for Lantus solostar insulin pen (with potential to substitute with Levemir flexpen) at time of d/c along with insulin pen needles.  Lantus SoloStar Insulin Pen (Order # [id number]) Levemir Flextouch Insulin Pen (Order # [id number]) Insulin pen needles order [phone]   Pt complains of lack of appetite over the past 3-4 days.  Thanks,  Joseph Headings RN, MSN,  BC-ADM Inpatient Diabetes Coordinator Team Pager [phone] (8a-5p)

## 2019-06-29 NOTE — Progress Notes (Signed)
PT Cancellation Note  Patient Details Name: [name] MRN: [id number] DOB: 1970/11/25   Cancelled Treatment:     PT order received but eval deferred - RN advises pt for surgery this evening at 6pm.  Will follow.   Rehema Muffley 06/29/2019, 3:48 PM

## 2019-06-29 NOTE — Anesthesia Preprocedure Evaluation (Addendum)
Anesthesia Evaluation  Patient identified by MRN, date of birth, ID band Patient awake    Reviewed: Allergy & Precautions, NPO status , Patient's Chart, lab work & pertinent test results, reviewed documented beta blocker date and time   Airway Mallampati: II   Neck ROM: Full    Dental  (+) Missing,    Pulmonary Current Smoker,    breath sounds clear to auscultation       Cardiovascular hypertension, Pt. on medications and Pt. on home beta blockers + CAD   Rhythm:Regular Rate:Normal     Neuro/Psych PSYCHIATRIC DISORDERS Depression    GI/Hepatic   Endo/Other  diabetes, Type 2, Insulin Dependent, Oral Hypoglycemic Agents  Renal/GU      Musculoskeletal negative musculoskeletal ROS (+)   Abdominal Normal abdominal exam  (+)   Peds  Hematology   Anesthesia Other Findings   Reproductive/Obstetrics                            Anesthesia Physical Anesthesia Plan  ASA: III  Anesthesia Plan: MAC   Post-op Pain Management:    Induction: Intravenous  PONV Risk Score and Plan: 1 and Propofol infusion  Airway Management Planned: Natural Airway and Simple Face Mask  Additional Equipment: None  Intra-op Plan:   Post-operative Plan:   Informed Consent: I have reviewed the patients History and Physical, chart, labs and discussed the procedure including the risks, benefits and alternatives for the proposed anesthesia with the patient or authorized representative who has indicated his/her understanding and acceptance.       Plan Discussed with: CRNA  Anesthesia Plan Comments:        Anesthesia Quick Evaluation

## 2019-06-29 NOTE — Anesthesia Procedure Notes (Addendum)
Procedure Name: MAC Date/Time: 06/29/2019 7:40 PM Performed by: Lissa Morales, CRNA Pre-anesthesia Checklist: Patient identified, Emergency Drugs available, Suction available, Patient being monitored and Timeout performed Patient Re-evaluated:Patient Re-evaluated prior to induction Oxygen Delivery Method: Simple face mask Placement Confirmation: positive ETCO2

## 2019-06-29 NOTE — Discharge Instructions (Addendum)
Resources from Diabetes Talk Glucerna shake supplement Take multivitamin Daily  Wound Care Orders: -Change dressing three times weekly. Cleanse wound. Black foam to wound base, followed by adherent dressing. Pierce dressing for track pad. Set to 125 mm Hg. Bridge to side of foot. Dress with padded kerlix and ACE bandage. Do not have tubing directly under ACE bandage.

## 2019-06-30 DIAGNOSIS — E11621 Type 2 diabetes mellitus with foot ulcer: Principal | ICD-10-CM

## 2019-06-30 DIAGNOSIS — L97429 Non-pressure chronic ulcer of left heel and midfoot with unspecified severity: Secondary | ICD-10-CM

## 2019-06-30 LAB — CBC
HCT: 30.7 % — ABNORMAL LOW (ref 39.0–52.0)
Hemoglobin: 9.4 g/dL — ABNORMAL LOW (ref 13.0–17.0)
MCH: 27.3 pg (ref 26.0–34.0)
MCHC: 30.6 g/dL (ref 30.0–36.0)
MCV: 89.2 fL (ref 80.0–100.0)
Platelets: 288 10*3/uL (ref 150–400)
RBC: 3.44 MIL/uL — ABNORMAL LOW (ref 4.22–5.81)
RDW: 14.6 % (ref 11.5–15.5)
WBC: 14.1 10*3/uL — ABNORMAL HIGH (ref 4.0–10.5)
nRBC: 0 % (ref 0.0–0.2)

## 2019-06-30 LAB — COMPREHENSIVE METABOLIC PANEL
ALT: 13 U/L (ref 0–44)
AST: 9 U/L — ABNORMAL LOW (ref 15–41)
Albumin: 2.4 g/dL — ABNORMAL LOW (ref 3.5–5.0)
Alkaline Phosphatase: 63 U/L (ref 38–126)
Anion gap: 7 (ref 5–15)
BUN: 21 mg/dL — ABNORMAL HIGH (ref 6–20)
CO2: 23 mmol/L (ref 22–32)
Calcium: 8.2 mg/dL — ABNORMAL LOW (ref 8.9–10.3)
Chloride: 111 mmol/L (ref 98–111)
Creatinine, Ser: 1.89 mg/dL — ABNORMAL HIGH (ref 0.61–1.24)
GFR calc Af Amer: 47 mL/min — ABNORMAL LOW (ref >60)
GFR calc non Af Amer: 41 mL/min — ABNORMAL LOW (ref >60)
Glucose, Bld: 143 mg/dL — ABNORMAL HIGH (ref 70–99)
Potassium: 3.9 mmol/L (ref 3.5–5.1)
Sodium: 141 mmol/L (ref 135–145)
Total Bilirubin: 0.6 mg/dL (ref 0.3–1.2)
Total Protein: 6 g/dL — ABNORMAL LOW (ref 6.5–8.1)

## 2019-06-30 LAB — PROTIME-INR
INR: 1.1 (ref 0.8–1.2)
Prothrombin Time: 14.1 seconds (ref 11.4–15.2)

## 2019-06-30 LAB — GLUCOSE, CAPILLARY
Glucose-Capillary: 140 mg/dL — ABNORMAL HIGH (ref 70–99)
Glucose-Capillary: 99 mg/dL (ref 70–99)
Glucose-Capillary: 100 mg/dL — ABNORMAL HIGH (ref 70–99)
Glucose-Capillary: 96 mg/dL (ref 70–99)

## 2019-06-30 MED ORDER — GLUCERNA SHAKE PO LIQD
237.0000 mL | Freq: Three times a day (TID) | ORAL | Status: DC
  Administered 2019-06-30 – 2019-07-01 (×3): 237 mL via ORAL
  Filled 2019-06-30 (×9): qty 237

## 2019-06-30 MED ORDER — AMLODIPINE BESYLATE 10 MG PO TABS
10.0000 mg | ORAL_TABLET | Freq: Every day | ORAL | Status: DC
  Administered 2019-07-01 – 2019-07-02 (×2): 10 mg via ORAL
  Filled 2019-06-30 (×2): qty 1

## 2019-06-30 MED ORDER — JUVEN PO PACK
1.0000 | PACK | Freq: Two times a day (BID) | ORAL | Status: DC
  Administered 2019-06-30 – 2019-07-02 (×5): 1 via ORAL
  Filled 2019-06-30 (×6): qty 1

## 2019-06-30 MED ORDER — ENOXAPARIN SODIUM 40 MG/0.4ML ~~LOC~~ SOLN
40.0000 mg | SUBCUTANEOUS | Status: DC
  Administered 2019-06-30 – 2019-07-02 (×3): 40 mg via SUBCUTANEOUS
  Filled 2019-06-30 (×3): qty 0.4

## 2019-06-30 NOTE — Progress Notes (Signed)
Subjective:  Patient ID: [name], male    DOB: 10-18-1970,  MRN: [id number]  Seen bedside. Pain controlled. Denies complaints. Objective:   Vitals:   06/30/19 0514 06/30/19 0857  BP: (!) 167/96 (!) 157/89  Pulse: 84 86  Resp: 18 18  Temp: 98.1 F (36.7 C) 98.3 F (36.8 C)  SpO2: 100% 95%   General AA&O x3. Normal mood and affect.  Vascular Dorsalis pedis and posterior tibial pulses 2/4 bilat. Brisk capillary refill to all digits. Pedal hair present.  Neurologic Epicritic sensation grossly intact.  Dermatologic Wound VAC on and functioning with minimal drainage in cannister  Orthopedic: Calf warm, no pain to palpation   Results for orders placed or performed during the hospital encounter of 06/29/19 (from the past 24 hour(s))  HIV Antibody (routine testing w rflx)     Status: None   Collection Time: 06/29/19 10:48 AM  Result Value Ref Range   HIV Screen 4th Generation wRfx NON REACTIVE NON REACTIVE  Comprehensive metabolic panel     Status: Abnormal   Collection Time: 06/29/19 10:48 AM  Result Value Ref Range   Sodium 140 135 - 145 mmol/L   Potassium 4.0 3.5 - 5.1 mmol/L   Chloride 107 98 - 111 mmol/L   CO2 25 22 - 32 mmol/L   Glucose, Bld 171 (H) 70 - 99 mg/dL   BUN 18 6 - 20 mg/dL   Creatinine, Ser 1.94 (H) 0.61 - 1.24 mg/dL   Calcium 8.5 (L) 8.9 - 10.3 mg/dL   Total Protein 7.3 6.5 - 8.1 g/dL   Albumin 2.9 (L) 3.5 - 5.0 g/dL   AST 11 (L) 15 - 41 U/L   ALT 15 0 - 44 U/L   Alkaline Phosphatase 78 38 - 126 U/L   Total Bilirubin 0.4 0.3 - 1.2 mg/dL   GFR calc non Af Amer 39 (L) >60 mL/min   GFR calc Af Amer 46 (L) >60 mL/min   Anion gap 8 5 - 15  CBC WITH DIFFERENTIAL     Status: Abnormal   Collection Time: 06/29/19 10:48 AM  Result Value Ref Range   WBC 13.7 (H) 4.0 - 10.5 K/uL   RBC 3.94 (L) 4.22 - 5.81 MIL/uL   Hemoglobin 10.7 (L) 13.0 - 17.0 g/dL   HCT 34.7 (L) 39.0 - 52.0 %   MCV 88.1 80.0 - 100.0 fL   MCH 27.2 26.0 - 34.0 pg   MCHC 30.8 30.0 -  36.0 g/dL   RDW 14.4 11.5 - 15.5 %   Platelets 303 150 - 400 K/uL   nRBC 0.0 0.0 - 0.2 %   Neutrophils Relative % 72 %   Neutro Abs 9.8 (H) 1.7 - 7.7 K/uL   Lymphocytes Relative 21 %   Lymphs Abs 2.8 0.7 - 4.0 K/uL   Monocytes Relative 5 %   Monocytes Absolute 0.7 0.1 - 1.0 K/uL   Eosinophils Relative 2 %   Eosinophils Absolute 0.2 0.0 - 0.5 K/uL   Basophils Relative 0 %   Basophils Absolute 0.0 0.0 - 0.1 K/uL   Immature Granulocytes 0 %   Abs Immature Granulocytes 0.06 0.00 - 0.07 K/uL  TSH     Status: None   Collection Time: 06/29/19 10:48 AM  Result Value Ref Range   TSH 1.770 0.350 - 4.500 uIU/mL  Hemoglobin A1c     Status: Abnormal   Collection Time: 06/29/19 10:48 AM  Result Value Ref Range   Hgb A1c MFr Bld 8.8 (H)  4.8 - 5.6 %   Mean Plasma Glucose 205.86 mg/dL  C-reactive protein     Status: Abnormal   Collection Time: 06/29/19 10:48 AM  Result Value Ref Range   CRP 8.1 (H) <1.0 mg/dL  Sedimentation rate     Status: Abnormal   Collection Time: 06/29/19 10:48 AM  Result Value Ref Range   Sed Rate 71 (H) 0 - 16 mm/hr  Glucose, capillary     Status: Abnormal   Collection Time: 06/29/19 11:36 AM  Result Value Ref Range   Glucose-Capillary 140 (H) 70 - 99 mg/dL  MRSA PCR Screening     Status: None   Collection Time: 06/29/19 12:04 PM   Specimen: Nasal Mucosa; Nasopharyngeal  Result Value Ref Range   MRSA by PCR NEGATIVE NEGATIVE  SARS CORONAVIRUS 2 (TAT 6-24 HRS) Nasopharyngeal Nasopharyngeal Swab     Status: None   Collection Time: 06/29/19 12:04 PM   Specimen: Nasopharyngeal Swab  Result Value Ref Range   SARS Coronavirus 2 NEGATIVE NEGATIVE  Respiratory Panel by RT PCR (Flu A&B, Covid) - Nasopharyngeal Swab     Status: None   Collection Time: 06/29/19  4:26 PM   Specimen: Nasopharyngeal Swab  Result Value Ref Range   SARS Coronavirus 2 by RT PCR NEGATIVE NEGATIVE   Influenza A by PCR NEGATIVE NEGATIVE   Influenza B by PCR NEGATIVE NEGATIVE  Glucose,  capillary     Status: Abnormal   Collection Time: 06/29/19  6:10 PM  Result Value Ref Range   Glucose-Capillary 54 (L) 70 - 99 mg/dL  Glucose, capillary     Status: Abnormal   Collection Time: 06/29/19  6:12 PM  Result Value Ref Range   Glucose-Capillary 61 (L) 70 - 99 mg/dL  Glucose, capillary     Status: Abnormal   Collection Time: 06/29/19  6:38 PM  Result Value Ref Range   Glucose-Capillary 131 (H) 70 - 99 mg/dL  Glucose, capillary     Status: None   Collection Time: 06/29/19  8:22 PM  Result Value Ref Range   Glucose-Capillary 71 70 - 99 mg/dL  Glucose, capillary     Status: None   Collection Time: 06/29/19  9:04 PM  Result Value Ref Range   Glucose-Capillary 93 70 - 99 mg/dL  Comprehensive metabolic panel     Status: Abnormal   Collection Time: 06/30/19  5:43 AM  Result Value Ref Range   Sodium 141 135 - 145 mmol/L   Potassium 3.9 3.5 - 5.1 mmol/L   Chloride 111 98 - 111 mmol/L   CO2 23 22 - 32 mmol/L   Glucose, Bld 143 (H) 70 - 99 mg/dL   BUN 21 (H) 6 - 20 mg/dL   Creatinine, Ser 1.89 (H) 0.61 - 1.24 mg/dL   Calcium 8.2 (L) 8.9 - 10.3 mg/dL   Total Protein 6.0 (L) 6.5 - 8.1 g/dL   Albumin 2.4 (L) 3.5 - 5.0 g/dL   AST 9 (L) 15 - 41 U/L   ALT 13 0 - 44 U/L   Alkaline Phosphatase 63 38 - 126 U/L   Total Bilirubin 0.6 0.3 - 1.2 mg/dL   GFR calc non Af Amer 41 (L) >60 mL/min   GFR calc Af Amer 47 (L) >60 mL/min   Anion gap 7 5 - 15  CBC     Status: Abnormal   Collection Time: 06/30/19  5:43 AM  Result Value Ref Range   WBC 14.1 (H) 4.0 - 10.5 K/uL   RBC  3.44 (L) 4.22 - 5.81 MIL/uL   Hemoglobin 9.4 (L) 13.0 - 17.0 g/dL   HCT 30.7 (L) 39.0 - 52.0 %   MCV 89.2 80.0 - 100.0 fL   MCH 27.3 26.0 - 34.0 pg   MCHC 30.6 30.0 - 36.0 g/dL   RDW 14.6 11.5 - 15.5 %   Platelets 288 150 - 400 K/uL   nRBC 0.0 0.0 - 0.2 %  Protime-INR     Status: None   Collection Time: 06/30/19  5:43 AM  Result Value Ref Range   Prothrombin Time 14.1 11.4 - 15.2 seconds   INR 1.1 0.8 - 1.2    Glucose, capillary     Status: Abnormal   Collection Time: 06/30/19  7:53 AM  Result Value Ref Range   Glucose-Capillary 140 (H) 70 - 99 mg/dL    Assessment & Plan:  Patient was evaluated and treated and all questions answered.  Diabetic Foot Ulcer Left With Infection -Micro from 4/1 (office culture) pending -No believed osteomyelitis found intra-op. Nor suggested on MRI. -Continue [place]. Due for change Sunday. -Will need [facility] for [facility] therapy. Orders placed. -Change dressing three times weekly. Cleanse wound. Black foam to wound base, followed by adherent dressing. Pierce dressing for track pad. Set to 125 mm Hg. Bridge to side of foot. -Plan for D/c tomorrow after change assuming home needs arranged. -Plan for PO Abx at d/c. -Will continue to follow.  [name], DPM  Accessible via secure chat for questions or concerns.

## 2019-06-30 NOTE — Progress Notes (Signed)
PROGRESS NOTE    [name]    Code Status: Full Code  LKG:[id number] DOB: 08-Aug-1970 DOA: 06/29/2019 LOS: 1 days  PCP: Cassandria Anger, MD CC: No chief complaint on file.      Hospital Summary   Per HPI: [name] is a 49 y.o. 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 Patient was seen by Dr Sharol Given on 3/30 diagnosed with infected ulcer left foot ulcer with concern for osteomyelitis.  At that time patient was started on oral doxycycline with plans for further intervention. He was recommended a BKA, however desired a second opinion and followed up with  primary care on 4/1 and was referred to [name], patients podiatrist. After podiatry evaluation patient was referred for admission for IV antibiotics, further imaging with MRI with plans for aggressive woundcare and further surgical intervention.   4/2: No evidence of Osteomyelitis on MRI. Underwent debridement of left foot ulcer and application of wound VAC  A & P   Active Problems:   Diabetic foot ulcer ([place])   1. Left diabetic foot ulcer without osteomyelitis, s/p debridement and wound vac (06/29/19 with Dr. March Rummage) a. Wound Vac change tomorrow b. Will need home health for vac (orders placed by podiatry) c. Ok to DC tomorrow per podiatry with PO antibiotics d. Continue vancomycin/cefepime for now e. Cultures pending, follow up f. Wound care: Change dressing three times weekly. Cleanse wound. Black foam to wound base, followed by adherent dressing. Pierce dressing for track pad. Set to 125 mm Hg. Bridge to side of foot.  2. AKI on CKD 3a a. Improving b. Continue holding ACEi  3. Type  2 Diabetes a. Continue Lantus 20 u, Novolog 6 u TID with meals, and mdoerate sliding scale  4. Hypertension a. Home benazepril on hold b. Increase amlodipine to 10 mg c. Continue Coreg  5. Hyperlipidemia a. Continue statin  6. Tobacco use a. Recommend cessation   DVT prophylaxis: lovenox Family Communication: no family at bedside Disposition Plan:   Patient came from:  home                                                                                          Anticipated d/c place: home  Barriers to d/c: continue IV antibiotics, pending micro cultures. Plan for Dc tomorrow pending podiatry recommendations  Pressure injury documentation    None  Consultants  Podiatry  Procedures  4/2 wound debridement and wound vac  Antibiotics   Anti-infectives (From admission, onward)   Start     Dose/Rate Route Frequency Ordered Stop   06/30/19 1400  vancomycin (VANCOREADY) IVPB 1250 mg/250 mL     1,250 mg 166.7 mL/hr over 90 Minutes Intravenous Every 24 hours 06/29/19 1129     06/29/19 2004  vancomycin (VANCOCIN) powder  Status:  Discontinued  As needed 06/29/19 2004 06/29/19 2105   06/29/19 1200  ceFEPIme (MAXIPIME) 2 g in sodium chloride 0.9 % 100 mL IVPB     2 g 200 mL/hr over 30 Minutes Intravenous Every 12 hours 06/29/19 1118     06/29/19 1130  vancomycin (VANCOREADY) IVPB 2000 mg/400 mL     2,000 mg 200 mL/hr over 120 Minutes Intravenous  Once 06/29/19 1118 06/29/19 1515        Subjective   Patient seen and examined at bedside in no acute distress and resting comfortably. No acute events overnight. Denies any acute complaints at this time. Tolerating diet well.   Objective   Vitals:   06/29/19 2102 06/30/19 0102 06/30/19 0514 06/30/19 0857  BP: (!) 165/96 (!) 149/83 (!) 167/96 (!) 157/89  Pulse: 80 83 84 86  Resp: 20 16 18 18   Temp: (!) 97.5 F (36.4 C) 98.1 F (36.7 C) 98.1 F (36.7 C) 98.3 F (36.8 C)  TempSrc:    Oral  SpO2: 100% 100% 100% 95%    Weight:      Height:        Intake/Output Summary (Last 24 hours) at 06/30/2019 1026 Last data filed at 06/30/2019 0445 Gross per 24 hour  Intake 1493.13 ml  Output 750 ml  Net 743.13 ml   Filed Weights   06/29/19 1800  Weight: 88.9 kg    Examination:  Physical Exam Vitals and nursing note reviewed.  Constitutional:      Appearance: Normal appearance.  HENT:     Head: Normocephalic and atraumatic.  Eyes:     Conjunctiva/sclera: Conjunctivae normal.  Cardiovascular:     Rate and Rhythm: Normal rate and regular rhythm.  Pulmonary:     Effort: Pulmonary effort is normal.     Breath sounds: Normal breath sounds.  Abdominal:     General: Abdomen is flat.     Palpations: Abdomen is soft.  Musculoskeletal:     Comments: Left foot with badage c/d/i Right foot with ace wrap  Skin:    Coloration: Skin is not jaundiced or pale.  Neurological:     Mental Status: He is alert. Mental status is at baseline.  Psychiatric:        Mood and Affect: Mood normal.        Behavior: Behavior normal.     Data Reviewed: I have personally reviewed following labs and imaging studies  CBC: Recent Labs  Lab 06/28/19 1010 06/29/19 1048 06/30/19 0543  WBC 14.9* 13.7* 14.1*  NEUTROABS 11.3* 9.8*  --   HGB 11.3* 10.7* 9.4*  HCT 34.5* 34.7* 30.7*  MCV 84.3 88.1 89.2  PLT 273.0 303 163   Basic Metabolic Panel: Recent Labs  Lab 06/28/19 1010 06/29/19 1048 06/30/19 0543  NA 138 140 141  K 4.1 4.0 3.9  CL 103 107 111  CO2 28 25 23   GLUCOSE 214* 171* 143*  BUN 21 18 21*  CREATININE 2.03* 1.94* 1.89*  CALCIUM 8.8 8.5* 8.2*   GFR: Estimated Creatinine Clearance: 47.5 mL/min (A) (by C-G formula based on SCr of 1.89 mg/dL (H)). Liver Function Tests: Recent Labs  Lab 06/29/19 1048 06/30/19 0543  AST 11* 9*  ALT 15 13  ALKPHOS 78 63  BILITOT 0.4 0.6  PROT 7.3 6.0*  ALBUMIN 2.9* 2.4*   No results for input(s): LIPASE, AMYLASE in the last 168 hours. No results for input(s):  AMMONIA in the last 168 hours. Coagulation Profile: Recent Labs  Lab 06/30/19 0543  INR  1.1   Cardiac Enzymes: No results for input(s): CKTOTAL, CKMB, CKMBINDEX, TROPONINI in the last 168 hours. BNP (last 3 results) No results for input(s): PROBNP in the last 8760 hours. HbA1C: Recent Labs    06/28/19 1010 06/29/19 1048  HGBA1C 9.0* 8.8*   CBG: Recent Labs  Lab 06/29/19 1812 06/29/19 1838 06/29/19 2022 06/29/19 2104 06/30/19 0753  GLUCAP 61* 131* 71 93 140*   Lipid Profile: No results for input(s): CHOL, HDL, LDLCALC, TRIG, CHOLHDL, LDLDIRECT in the last 72 hours. Thyroid Function Tests: Recent Labs    06/29/19 1048  TSH 1.770   Anemia Panel: No results for input(s): VITAMINB12, FOLATE, FERRITIN, TIBC, IRON, RETICCTPCT in the last 72 hours. Sepsis Labs: No results for input(s): PROCALCITON, LATICACIDVEN in the last 168 hours.  Recent Results (from the past 240 hour(s))  MRSA PCR Screening     Status: None   Collection Time: 06/29/19 12:04 PM   Specimen: Nasal Mucosa; Nasopharyngeal  Result Value Ref Range Status   MRSA by PCR NEGATIVE NEGATIVE Final    Comment:        The GeneXpert MRSA Assay (FDA approved for NASAL specimens only), is one component of a comprehensive MRSA colonization surveillance program. It is not intended to diagnose MRSA infection nor to guide or monitor treatment for MRSA infections. Performed at [facility], [place] [address], [place], [place] [address]   SARS CORONAVIRUS 2 (TAT 6-24 HRS) Nasopharyngeal Nasopharyngeal Swab     Status: None   Collection Time: 06/29/19 12:04 PM   Specimen: Nasopharyngeal Swab  Result Value Ref Range Status   SARS Coronavirus 2 NEGATIVE NEGATIVE Final    Comment: (NOTE) SARS-CoV-2 target nucleic acids are NOT DETECTED. The SARS-CoV-2 RNA is generally detectable in upper and lower respiratory specimens during the acute phase of infection. Negative results do not preclude  SARS-CoV-2 infection, do not rule out co-infections with other pathogens, and should not be used as the sole basis for treatment or other patient management decisions. Negative results must be combined with clinical observations, patient history, and epidemiological information. The expected result is Negative. Fact Sheet for Patients: SugarRoll.be Fact Sheet for Healthcare Providers: https://www.woods-mathews.com/ This test is not yet approved or cleared by the [place] FDA and  has been authorized for detection and/or diagnosis of SARS-CoV-2 by FDA under an Emergency Use Authorization (EUA). This EUA will remain  in effect (meaning this test can be used) for the duration of the COVID-19 declaration under Section 56 4(b)(1) of the Act, 21 U.S.C. section 360bbb-3(b)(1), unless the authorization is terminated or revoked sooner. Performed at [facility], [place] [address] [place], [place] [address]   Respiratory Panel by RT PCR (Flu A&B, Covid) - Nasopharyngeal Swab     Status: None   Collection Time: 06/29/19  4:26 PM   Specimen: Nasopharyngeal Swab  Result Value Ref Range Status   SARS Coronavirus 2 by RT PCR NEGATIVE NEGATIVE Final    Comment: (NOTE) SARS-CoV-2 target nucleic acids are NOT DETECTED. The SARS-CoV-2 RNA is generally detectable in upper respiratoy specimens during the acute phase of infection. The lowest concentration of SARS-CoV-2 viral copies this assay can detect is 131 copies/mL. A negative result does not preclude SARS-Cov-2 infection and should not be used as the sole basis for treatment or other patient management decisions. A negative result may occur with  improper specimen collection/handling, submission of specimen other than nasopharyngeal swab, presence of viral mutation(s) within the areas targeted by this  assay, and inadequate number of viral copies (<131 copies/mL). A negative result must be combined  with clinical observations, patient history, and epidemiological information. The expected result is Negative. Fact Sheet for Patients:  PinkCheek.be Fact Sheet for Healthcare Providers:  GravelBags.it This test is not yet ap proved or cleared by the [place] FDA and  has been authorized for detection and/or diagnosis of SARS-CoV-2 by FDA under an Emergency Use Authorization (EUA). This EUA will remain  in effect (meaning this test can be used) for the duration of the COVID-19 declaration under Section 564(b)(1) of the Act, 21 U.S.C. section 360bbb-3(b)(1), unless the authorization is terminated or revoked sooner.    Influenza A by PCR NEGATIVE NEGATIVE Final   Influenza B by PCR NEGATIVE NEGATIVE Final    Comment: (NOTE) The Xpert Xpress SARS-CoV-2/FLU/RSV assay is intended as an aid in  the diagnosis of influenza from Nasopharyngeal swab specimens and  should not be used as a sole basis for treatment. Nasal washings and  aspirates are unacceptable for Xpert Xpress SARS-CoV-2/FLU/RSV  testing. Fact Sheet for Patients: PinkCheek.be Fact Sheet for Healthcare Providers: GravelBags.it This test is not yet approved or cleared by the [place] FDA and  has been authorized for detection and/or diagnosis of SARS-CoV-2 by  FDA under an Emergency Use Authorization (EUA). This EUA will remain  in effect (meaning this test can be used) for the duration of the  Covid-19 declaration under Section 564(b)(1) of the Act, 21  U.S.C. section 360bbb-3(b)(1), unless the authorization is  terminated or revoked. Performed at [facility], [place] [address] [place], [place] [address]          Radiology Studies: MR FOOT LEFT W WO CONTRAST  Result Date: 06/29/2019 CLINICAL DATA:  Foot pain and swelling. EXAM: MRI OF THE LEFT FOREFOOT WITHOUT AND WITH CONTRAST  TECHNIQUE: Multiplanar, multisequence MR imaging of the left foot was performed both before and after administration of intravenous contrast. 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 Chronic deformity of the second proximal phalanx. I do not see any findings suspicious for septic arthritis or osteomyelitis. 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 There appears to be a large open wound on the plantar aspect of the midfoot with possible surrounding blister formation. I do not see a discrete drainable soft tissue abscess or significant cellulitis or myofasciitis. IMPRESSION: 1. Large open wound on the plantar aspect of the midfoot with possible surrounding blister formation. No discrete drainable soft tissue abscess or significant cellulitis. 2. No findings suspicious for septic arthritis or osteomyelitis. 3. 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 Electronically Signed   By: Marijo Sanes M.D.   On: 06/29/2019 18:39   DG Foot Complete Left  Result Date: 06/28/2019 Please see detailed radiograph report in office note.       Scheduled Meds: . amLODipine  5 mg Oral Daily  . atorvastatin  20 mg Oral Daily  . carvedilol  25 mg Oral BID WC  . escitalopram  10 mg Oral Daily  . feeding supplement (GLUCERNA SHAKE)  237 mL Oral TID BM  . ferrous sulfate  325 mg Oral Daily  . gabapentin  300 mg Oral TID  . heparin  5,000 Units Subcutaneous Q8H  . insulin aspart  0-15 Units Subcutaneous TID WC  . insulin aspart  6 Units Subcutaneous TID WC  . insulin glargine  20  Units Subcutaneous Daily  . nutrition supplement (JUVEN)  1 packet Oral BID BM  . sodium chloride flush  3 mL Intravenous Q12H  . [START ON 07/04/2019] Vitamin D (Ergocalciferol)  50,000 Units Oral Weekly   Continuous Infusions: . ceFEPime (MAXIPIME) IV 2 g (06/29/19 2316)  .  vancomycin       Time spent: 25 minutes with over 50% of the time coordinating the patient's care    Harold Hedge, DO [facility] Pager [phone]  Call night coverage person covering after 7pm

## 2019-06-30 NOTE — Evaluation (Signed)
Occupational Therapy Evaluation Patient Details Name: [name] MRN: [id number] DOB: [birth year] Today's Date: 06/30/2019    History of Present Illness Pt s/p I&D of L foot with wound vac placement.  Pt with hx of uncontrolled DM and toe amputations bil feet.   Clinical Impression   PTA Pt independent in ADL and mobility. Lives with his significant other who is independent at [facility] level - and he does most of the IADL around the house. Today he is +2 safety for transfers, heavy education on how to use RW to maintain NWB on LLE. Pt required constant cues and is currently unable to maintain NWB for wound vac integrity. Pt able to don shoe on RLE without assist, but required mod A for boot on LLE. Pt will benefit from skilled OT in the acute setting as well as afterwards at the [facility] level to maximize safety and independence in ADL, functional transfers and IADL and return to PLOF.     Follow Up Recommendations  Home health OT;Supervision - Intermittent    Equipment Recommendations  3 in 1 bedside commode    Recommendations for Other Services       Precautions / Restrictions Precautions Precautions: Fall Required Braces or Orthoses: Other Brace Other Brace: air walker boot L foot Restrictions Weight Bearing Restrictions: Yes LLE Weight Bearing: Non weight bearing Other Position/Activity Restrictions: NWB L foot      Mobility Bed Mobility Overal bed mobility: Modified Independent             General bed mobility comments: Pt unassisted to EOB sitting  Transfers Overall transfer level: Needs assistance Equipment used: Rolling walker (2 wheeled) Transfers: Sit to/from Stand Sit to Stand: [employer] safety/equipment(initially +2 for safety, able to progress)         General transfer comment: cues for safety, LE management to minimize WB and use of UEs to self assist    Balance Overall balance assessment: Needs assistance Sitting-balance support: No upper extremity  supported;Feet supported Sitting balance-Leahy Scale: Good     Standing balance support: Bilateral upper extremity supported Standing balance-Leahy Scale: Poor Standing balance comment: dependent on BUE for standing and cues to maintain WB precautions                           ADL either performed or assessed with clinical judgement   ADL Overall ADL's : Needs assistance/impaired Eating/Feeding: Set up   Grooming: Set up;Sitting Grooming Details (indicate cue type and reason): in recliner Upper Body Bathing: Set up   Lower Body Bathing: Min guard   Upper Body Dressing : Set up   Lower Body Dressing: Sitting/lateral leans;Moderate assistance Lower Body Dressing Details (indicate cue type and reason): to don boot max A, able to don shoe on R foot without assist Toilet Transfer: Minimal assistance;+2 for safety/equipment;RW Toilet Transfer Details (indicate cue type and reason): vc for safe hand placement Toileting- Clothing Manipulation and Hygiene: Moderate assistance;Sit to/from stand Toileting - Clothing Manipulation Details (indicate cue type and reason): cannot maintain WB in standing without BUE support     Functional mobility during ADLs: Minimal assistance;+2 for safety/equipment;Rolling walker;Cueing for sequencing;Cueing for safety General ADL Comments: educated on sequence of dressing, performing ADL in seating due to NWB of LLE     Vision Baseline Vision/History: Wears glasses Patient Visual Report: No change from baseline       Perception     Praxis      Pertinent  Vitals/Pain Pain Assessment: No/denies pain     Hand Dominance Right   Extremity/Trunk Assessment Upper Extremity Assessment Upper Extremity Assessment: Overall WFL for tasks assessed   Lower Extremity Assessment Lower Extremity Assessment: Overall WFL for tasks assessed       Communication Communication Communication: No difficulties   Cognition Arousal/Alertness:  Awake/alert Behavior During Therapy: Impulsive;WFL for tasks assessed/performed Overall Cognitive Status: No family/caregiver present to determine baseline cognitive functioning Area of Impairment: Safety/judgement                         Safety/Judgement: Decreased awareness of deficits;Decreased awareness of safety     General Comments: Pt reports walking on foot to bathroom earlier, and even with cues had trouble following directions for maintaining WB precautions   General Comments  Pt very plesant, very good attitude and enjoys joking around, hard worker    Exercises     Shoulder Instructions      Home Living Family/patient expects to be discharged to:: Private residence Living Arrangements: Spouse/significant other Available Help at Discharge: Family;Available PRN/intermittently Type of Home: House Home Access: Ramped entrance     Home Layout: One level     Bathroom Shower/Tub: Occupational psychologist: Standard Bathroom Accessibility: Yes   Home Equipment: Walker - 2 wheels          Prior Functioning/Environment Level of Independence: Independent        Comments: Pt was assisting GF who is in [place] 2* CP        OT Problem List: Decreased activity tolerance;Impaired balance (sitting and/or standing);Decreased safety awareness;Decreased knowledge of use of DME or AE;Decreased knowledge of precautions;Pain      OT Treatment/Interventions: Self-care/ADL training;Energy conservation;DME and/or AE instruction;Therapeutic activities;Patient/family education;Balance training    OT Goals(Current goals can be found in the care plan section) Acute Rehab OT Goals Patient Stated Goal: Regain IND OT Goal Formulation: With patient Time For Goal Achievement: 07/14/19 Potential to Achieve Goals: Good ADL Goals Pt Will Perform Grooming: with modified independence;sitting Pt Will Perform Upper Body Dressing: with modified independence;sitting Pt Will  Perform Lower Body Dressing: with supervision;sitting/lateral leans Pt Will Transfer to Toilet: with modified independence;ambulating Pt Will Perform Toileting - Clothing Manipulation and hygiene: with modified independence;sitting/lateral leans  OT Frequency: Min 2X/week   Barriers to D/C:    sig other is in a WC, unsure of ability to provide assistance if needed       Co-evaluation PT/OT/SLP Co-Evaluation/Treatment: Yes Reason for Co-Treatment: To address functional/ADL transfers;For patient/therapist safety PT goals addressed during session: Mobility/safety with mobility;Balance;Proper use of DME OT goals addressed during session: ADL's and self-care;Proper use of Adaptive equipment and DME      AM-PAC OT "6 Clicks" Daily Activity     Outcome Measure Help from another person eating meals?: A Little Help from another person taking care of personal grooming?: A Little Help from another person toileting, which includes using toliet, bedpan, or urinal?: A Lot Help from another person bathing (including washing, rinsing, drying)?: A Little Help from another person to put on and taking off regular upper body clothing?: A Little Help from another person to put on and taking off regular lower body clothing?: A Lot 6 Click Score: 16   End of Session Equipment Utilized During Treatment: Gait belt;Rolling walker;Other (comment)(air boot on LLE, wound vac) Nurse Communication: Mobility status;Precautions;Weight bearing status  Activity Tolerance: Patient tolerated treatment well Patient left: in chair;with call bell/phone within  reach;with chair alarm set  OT Visit Diagnosis: Unsteadiness on feet (R26.81);Other abnormalities of gait and mobility (R26.89);Pain Pain - Right/Left: Left Pain - part of body: Ankle and joints of foot                Time: 1779-3903 OT Time Calculation (min): 30 min Charges:  OT General Charges $OT Visit: 1 Visit OT Evaluation $OT Eval Moderate Complexity: [place] OTR/L Acute Rehabilitation Services Pager: [phone] Office: [place] 06/30/2019, 1:01 PM

## 2019-06-30 NOTE — TOC Initial Note (Signed)
Transition of Care [facility]) - Initial/Assessment Note    Patient Details  Name: [name] MRN: [id number] Date of Birth: [birth year]  Transition of Care (TOC) CM/SW Contact:    Joaquin Courts, RN Phone Number: 06/30/2019, 4:17 PM  Clinical Narrative:    CM received call from MD stating patient will need wound vac for home.  Completed vac forms were faxed to [facility] and a voicemail was left for [employer] rep, Olivia Mackie, requesting vac unit delivery to hospital.  CM called all [place] agencies servicing the [place] area including [facility], [facility], [facility], [employer], [facility], [facility], [facility], [facility], [facility], and [facility]. All agencies have declined due to payor source and lack of staffing. CM is unable to arrange [facility] services for patient.  [name] was notified of this and stated patient could come to office to have wound vac dressings changed.                 Expected Discharge Plan: [place] Barriers to Discharge: Continued Medical Work up   Patient Goals and CMS Choice Patient states their goals for this hospitalization and ongoing recovery are:: to go home CMS Medicare.gov Compare Post Acute Care list provided to:: Patient Choice offered to / list presented to : Patient  Expected Discharge Plan and Services Expected Discharge Plan: [place]   Discharge Planning Services: CM Consult Post Acute Care Choice: [place] arrangements for the past 2 months: Single Family Home                 DME Arranged: Negative pressure wound device DME Agency: [employer] Date DME Agency Contacted: 06/30/19 Time DME Agency Contacted: 108 Representative spoke with at DME Agency: faxed to number on forms, voicemail left for [facility] Arranged: (see note)          Prior Living Arrangements/Services Living arrangements for the past 2 months: Single Family Home   Patient language and need for interpreter reviewed:: Yes Do you feel safe  going back to the place where you live?: Yes      Need for Family Participation in Patient Care: No (Comment) Care giver support system in place?: No (comment)   Criminal Activity/Legal Involvement Pertinent to Current Situation/Hospitalization: No - Comment as needed  Activities of Daily Living Home Assistive Devices/Equipment: Eyeglasses, CBG Meter ADL Screening (condition at time of admission) Patient's cognitive ability adequate to safely complete daily activities?: Yes Is the patient deaf or have difficulty hearing?: No Does the patient have difficulty seeing, even when wearing glasses/contacts?: No Does the patient have difficulty concentrating, remembering, or making decisions?: No Patient able to express need for assistance with ADLs?: Yes Does the patient have difficulty dressing or bathing?: No Independently performs ADLs?: Yes (appropriate for developmental age) Does the patient have difficulty walking or climbing stairs?: Yes Weakness of Legs: Left Weakness of Arms/Hands: None  Permission Sought/Granted                  Emotional Assessment           Psych Involvement: No (comment)  Admission diagnosis:  Diabetic foot ulcer ([place]) [V40.086, L97.509] Patient Active Problem List   Diagnosis Date Noted  . Diabetic foot ulcer ([place]) 06/29/2019  . CRI (chronic renal insufficiency), stage 3 (moderate) 01/24/2019  . Obesity 01/24/2019  . 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[place]) 11/13/2018  . Diabetic foot ([place]) 10/17/2018  . Non-pressure chronic ulcer of other part of  right foot limited to breakdown of skin ([place]) 09/11/2018  . 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 Type 2 diabetes mellitus with hyperglycemia, without long-term current use of insulin ([place]) 07/14/2018  . Wound cellulitis 05/22/2018  . Osteomyelitis of foot, acute ([place]) 05/21/2018  . 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 Diabetic ulcer of left midfoot associated with diabetes mellitus due to underlying condition, with fat layer exposed ([place]) 08/26/2017  . B12 deficiency 06/15/2017  . Vitamin D deficiency 06/03/2017  . Iron deficiency anemia 06/01/2017  . Diabetic foot infection ([place])   . Osteomyelitis ([place]) 05/25/2017  . Vitreous floaters of right eye 09/15/2016  . Non compliance w medication regimen 09/15/2016  . Depression 09/01/2016  . Foot ulcer due to secondary DM ([place]) 08/20/2016  . Hidradenitis suppurativa 10/13/2015  . Peripheral polyneuropathy 07/07/2015  . Coronary atherosclerosis of native coronary artery 08/17/2013  . Diabetic retinopathy ([place]) 02/02/2013  . Diabetic neuropathy ([place]) 12/01/2012  . Hypertension associated with diabetes ([place]) 08/03/2012  . Tobacco use disorder 08/03/2012  . Erectile dysfunction 08/03/2012  . Diabetes mellitus with neurological manifestations, uncontrolled ([place]) 05/26/2006   PCP:  Plotnikov, Evie Lacks, MD Pharmacy:   [facility] [address] ([address]), [place] - [place] [address] [place] [address] Phone: [phone] Fax: [phone]  [place], [place] - [address] [facility] [address] [place] [address] Phone: [phone] Fax: [phone]  [id number] - [place], [place] [address] Phone: [phone] Fax: [phone]     Social Determinants of Health (SDOH) Interventions    Readmission Risk Interventions No flowsheet data found.

## 2019-06-30 NOTE — Evaluation (Signed)
Physical Therapy Evaluation Patient Details Name: [name] MRN: [id number] DOB: 01/22/1971 Today's Date: 06/30/2019   History of Present Illness  Pt s/p I&D of L foot with wound vac placement.  Pt with hx of uncontrolled DM and toe amputations bil feet.  Clinical Impression  Pt admitted as above and presenting with functional mobility limitations 2* NWB on L LE and ambulatory balance deficits.  Pt should progress to dc home with intermittent assist of family and would benefit from follow up HHPT to regain function in home setting.    Follow Up Recommendations Home health PT    Equipment Recommendations  Other (comment)(TBD)    Recommendations for Other Services       Precautions / Restrictions Precautions Precautions: Fall Restrictions Weight Bearing Restrictions: Yes Other Position/Activity Restrictions: NWB L foot      Mobility  Bed Mobility Overal bed mobility: Modified Independent             General bed mobility comments: Pt unassisted to EOB sitting  Transfers Overall transfer level: Needs assistance Equipment used: Rolling walker (2 wheeled) Transfers: Sit to/from Stand Sit to Stand: Min assist         General transfer comment: cues for safety, LE management to minimize WB and use of UEs to self assist  Ambulation/Gait Ambulation/Gait assistance: Min assist Gait Distance (Feet): 32 Feet Assistive device: Rolling walker (2 wheeled) Gait Pattern/deviations: Step-to pattern;Decreased step length - right;Decreased step length - left;Shuffle;Trunk flexed Gait velocity: decr   General Gait Details: cues for sequence, posture, increased UE WB for NWB, and safety;  Physical assist for balance and support  Stairs            Wheelchair Mobility    Modified [place] (Stroke Patients Only)       Balance Overall balance assessment: Needs assistance Sitting-balance support: No upper extremity supported;Feet supported Sitting balance-Leahy  Scale: Good     Standing balance support: Bilateral upper extremity supported Standing balance-Leahy Scale: Poor                               Pertinent Vitals/Pain Pain Assessment: No/denies pain    Home Living Family/patient expects to be discharged to:: Private residence Living Arrangements: Spouse/significant other Available Help at Discharge: Family;Available PRN/intermittently Type of Home: House Home Access: Ramped entrance     Home Layout: One level Home Equipment: Walker - 2 wheels      Prior Function Level of Independence: Independent         Comments: Pt was assisting GF who is in WC 2* CP     Hand Dominance   Dominant Hand: Right    Extremity/Trunk Assessment   Upper Extremity Assessment Upper Extremity Assessment: Overall WFL for tasks assessed    Lower Extremity Assessment Lower Extremity Assessment: Overall WFL for tasks assessed       Communication   Communication: No difficulties  Cognition Arousal/Alertness: Awake/alert Behavior During Therapy: Impulsive;WFL for tasks assessed/performed Overall Cognitive Status: Within Functional Limits for tasks assessed                                        General Comments      Exercises     Assessment/Plan    PT Assessment Patient needs continued PT services  PT Problem List Decreased strength;Decreased activity tolerance;Decreased balance;Decreased mobility;Decreased knowledge  of use of DME;Decreased safety awareness;Decreased knowledge of precautions       PT Treatment Interventions DME instruction;Gait training;Functional mobility training;Therapeutic activities;Therapeutic exercise;Balance training;Patient/family education    PT Goals (Current goals can be found in the Care Plan section)  Acute Rehab PT Goals Patient Stated Goal: Regain IND PT Goal Formulation: With patient Time For Goal Achievement: 07/14/19 Potential to Achieve Goals: Fair     Frequency Min 3X/week   Barriers to discharge Decreased caregiver support Does not have 24/7 assist; pt assists GF who is in wc    Co-evaluation PT/OT/SLP Co-Evaluation/Treatment: Yes Reason for Co-Treatment: To address functional/ADL transfers PT goals addressed during session: Mobility/safety with mobility OT goals addressed during session: ADL's and self-care       AM-PAC PT "6 Clicks" Mobility  Outcome Measure Help needed turning from your back to your side while in a flat bed without using bedrails?: None Help needed moving from lying on your back to sitting on the side of a flat bed without using bedrails?: None Help needed moving to and from a bed to a chair (including a wheelchair)?: A Little Help needed standing up from a chair using your arms (e.g., wheelchair or bedside chair)?: A Little Help needed to walk in hospital room?: A Little Help needed climbing 3-5 steps with a railing? : A Lot 6 Click Score: 19    End of Session Equipment Utilized During Treatment: Gait belt Activity Tolerance: Patient tolerated treatment well;Patient limited by fatigue Patient left: in chair;with call bell/phone within reach;with chair alarm set Nurse Communication: Mobility status PT Visit Diagnosis: Difficulty in walking, not elsewhere classified (R26.2)    Time: 1120-1150 PT Time Calculation (min) (ACUTE ONLY): 30 min   Charges:   PT Evaluation $PT Eval Low Complexity: 1 Low          [place] Pager [phone] Office [phone]   Jasmyne Lodato 06/30/2019, 12:30 PM

## 2019-07-01 DIAGNOSIS — L089 Local infection of the skin and subcutaneous tissue, unspecified: Secondary | ICD-10-CM

## 2019-07-01 DIAGNOSIS — E11628 Type 2 diabetes mellitus with other skin complications: Secondary | ICD-10-CM

## 2019-07-01 DIAGNOSIS — L97422 Non-pressure chronic ulcer of left heel and midfoot with fat layer exposed: Secondary | ICD-10-CM

## 2019-07-01 LAB — BASIC METABOLIC PANEL
Anion gap: 5 (ref 5–15)
BUN: 27 mg/dL — ABNORMAL HIGH (ref 6–20)
CO2: 23 mmol/L (ref 22–32)
Calcium: 8.4 mg/dL — ABNORMAL LOW (ref 8.9–10.3)
Chloride: 112 mmol/L — ABNORMAL HIGH (ref 98–111)
Creatinine, Ser: 2.05 mg/dL — ABNORMAL HIGH (ref 0.61–1.24)
GFR calc Af Amer: 43 mL/min — ABNORMAL LOW (ref >60)
GFR calc non Af Amer: 37 mL/min — ABNORMAL LOW (ref >60)
Glucose, Bld: 80 mg/dL (ref 70–99)
Potassium: 4.5 mmol/L (ref 3.5–5.1)
Sodium: 140 mmol/L (ref 135–145)

## 2019-07-01 LAB — WOUND CULTURE
MICRO NUMBER:: 10317896
RESULT:: NO GROWTH
SPECIMEN QUALITY:: ADEQUATE

## 2019-07-01 LAB — GLUCOSE, CAPILLARY
Glucose-Capillary: 76 mg/dL (ref 70–99)
Glucose-Capillary: 93 mg/dL (ref 70–99)
Glucose-Capillary: 116 mg/dL — ABNORMAL HIGH (ref 70–99)
Glucose-Capillary: 149 mg/dL — ABNORMAL HIGH (ref 70–99)
Glucose-Capillary: 179 mg/dL — ABNORMAL HIGH (ref 70–99)

## 2019-07-01 LAB — CBC
HCT: 30.2 % — ABNORMAL LOW (ref 39.0–52.0)
Hemoglobin: 9.2 g/dL — ABNORMAL LOW (ref 13.0–17.0)
MCH: 27.5 pg (ref 26.0–34.0)
MCHC: 30.5 g/dL (ref 30.0–36.0)
MCV: 90.1 fL (ref 80.0–100.0)
Platelets: 301 10*3/uL (ref 150–400)
RBC: 3.35 MIL/uL — ABNORMAL LOW (ref 4.22–5.81)
RDW: 14.6 % (ref 11.5–15.5)
WBC: 12.4 10*3/uL — ABNORMAL HIGH (ref 4.0–10.5)
nRBC: 0 % (ref 0.0–0.2)

## 2019-07-01 MED ORDER — SODIUM CHLORIDE 0.9 % IV SOLN: INTRAVENOUS | Status: AC

## 2019-07-01 MED ORDER — VANCOMYCIN HCL 750 MG/150ML IV SOLN
750.0000 mg | INTRAVENOUS | Status: DC
  Administered 2019-07-01 – 2019-07-02 (×2): 750 mg via INTRAVENOUS
  Filled 2019-07-01 (×2): qty 150

## 2019-07-01 MED ORDER — DEXTROSE 50 % IV SOLN
INTRAVENOUS | Status: AC
  Filled 2019-07-01: qty 50

## 2019-07-01 MED ORDER — INSULIN ASPART 100 UNIT/ML ~~LOC~~ SOLN
0.0000 [IU] | Freq: Three times a day (TID) | SUBCUTANEOUS | Status: DC
  Administered 2019-07-02: 1 [IU] via SUBCUTANEOUS

## 2019-07-01 MED ORDER — TRAZODONE HCL 50 MG PO TABS: 50.0000 mg | ORAL_TABLET | Freq: Once | ORAL | Status: DC

## 2019-07-01 NOTE — Anesthesia Postprocedure Evaluation (Signed)
Anesthesia Post Note  Patient: [name]  Procedure(s) Performed: IRRIGATION AND DEBRIDEMENT FOOT application wound vac (Left Foot)     Patient location during evaluation: PACU Anesthesia Type: MAC Level of consciousness: awake and alert Pain management: pain level controlled Vital Signs Assessment: post-procedure vital signs reviewed and stable Respiratory status: spontaneous breathing, nonlabored ventilation, respiratory function stable and patient connected to nasal cannula oxygen Cardiovascular status: stable and blood pressure returned to baseline Postop Assessment: no apparent nausea or vomiting Anesthetic complications: no    Last Vitals:  Vitals:   06/30/19 2039 07/01/19 0547  BP: (!) 154/92 (!) 152/94  Pulse: 80 71  Resp: 18 20  Temp: 37.2 C 36.8 C  SpO2: 100% 100%    Last Pain:  Vitals:   07/01/19 0547  TempSrc: Oral  PainSc:                  Effie Berkshire

## 2019-07-01 NOTE — Progress Notes (Signed)
Writer called pt's room by pt, stating "I don't feel well". Pt very sweaty and closed his eyes and laid back in bed. Difficult to arouse. RR called. CBG 40. 1 amp of Dextrose given IV. Sarah from [facility] here. Aware of pt condition. Paging doctor to check on orders for Novolog coverage.  Pt now alert and talking in full sentences.  Pt continues on telemetry. MD returned page and will adjust orders. Closely monitoring.  Rechecking CBG now. CBG 149 Pt eating graham crackers.

## 2019-07-01 NOTE — Progress Notes (Signed)
PROGRESS NOTE    [name]    Code Status: Full Code  UJW:[id number] DOB: [birth year] DOA: 06/29/2019 LOS: 2 days  PCP: Cassandria Anger, MD CC: No chief complaint on file.      Hospital Summary   Per HPI: [name] is a 49 y.o. 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 Patient was seen by Dr Sharol Given on 3/30 diagnosed with infected ulcer left foot ulcer with concern for osteomyelitis.  At that time patient was started on oral doxycycline with plans for further intervention. He was recommended a BKA, however desired a second opinion and followed up with  primary care on 4/1 and was referred to [name], patients podiatrist. After podiatry evaluation patient was referred for admission for IV antibiotics, further imaging with MRI with plans for aggressive woundcare and further surgical intervention.   4/2: No evidence of Osteomyelitis on MRI. Underwent debridement of left foot ulcer and application of wound VAC  A & P   Principal Problem:   Diabetic infection of left foot ([place]) Active Problems:   Diabetic foot ulcer ([place])   1. Left diabetic foot ulcer without osteomyelitis, s/p debridement and wound vac (06/29/19 with Dr. March Rummage) with gram-positive cocci in pairs a. Will need home health and wound VAC at discharge.  Unfortunately he cannot get his wound VAC until tomorrow and so needs to stay until supplies are delivered. b. Continue vancomycin/cefepime for now, at discharge transition to p.o. antibiotics c. Cultures pending d. Wound care: Change dressing three times weekly. Cleanse wound. Black foam to wound base, followed by adherent  dressing. Pierce dressing for track pad. Set to 125 mm Hg. Bridge to side of foot.  2. AKI on CKD 3a a. Continue holding ACEi b. Will give IV hydration  3. Type 2 Diabetes a. Continue Lantus 20 u, Novolog 6 u TID with meals, and mdoerate sliding scale  4. Hypertension a. Home benazepril on hold b. Increased amlodipine to 10 mg c. Continue Coreg  5. Hyperlipidemia a. Continue statin  6. Tobacco use a. Recommend cessation   DVT prophylaxis: lovenox Family Communication: no family at bedside patient to update girlfriend Disposition Plan:   Patient came from:  home                                                                                          Anticipated d/c place: home  Barriers to d/c: Continue IV antibiotics, IV fluids and awaiting on wound VAC supplies to be delivered tomorrow.  Form signed on chart  Pressure injury documentation    None  Consultants  Podiatry  Procedures  4/2 wound debridement and wound vac  Antibiotics   Anti-infectives (From admission, onward)   Start     Dose/Rate Route Frequency Ordered Stop   06/30/19 1400  vancomycin (VANCOREADY) IVPB 1250  mg/250 mL     1,250 mg 166.7 mL/hr over 90 Minutes Intravenous Every 24 hours 06/29/19 1129     06/29/19 2004  vancomycin (VANCOCIN) powder  Status:  Discontinued       As needed 06/29/19 2004 06/29/19 2105   06/29/19 1200  ceFEPIme (MAXIPIME) 2 g in sodium chloride 0.9 % 100 mL IVPB     2 g 200 mL/hr over 30 Minutes Intravenous Every 12 hours 06/29/19 1118     06/29/19 1130  vancomycin (VANCOREADY) IVPB 2000 mg/400 mL     2,000 mg 200 mL/hr over 120 Minutes Intravenous  Once 06/29/19 1118 06/29/19 1515        Subjective   Patient seen and examined at bedside no acute distress and resting comfortably.  No events overnight.  Tolerating diet.  Denies any chest pain, shortness of breath, fever, nausea, vomiting, urinary complaints.  Admits to having bowel movement.  Otherwise ROS  negative   Objective   Vitals:   06/30/19 1153 06/30/19 1711 06/30/19 2039 07/01/19 0547  BP: (!) 147/83 (!) 155/90 (!) 154/92 (!) 152/94  Pulse: 85 85 80 71  Resp: 18 18 18 20   Temp: 98.4 F (36.9 C) 98.1 F (36.7 C) 98.9 F (37.2 C) 98.3 F (36.8 C)  TempSrc: Oral Oral Oral Oral  SpO2: 100% 99% 100% 100%  Weight:      Height:        Intake/Output Summary (Last 24 hours) at 07/01/2019 1155 Last data filed at 07/01/2019 0830 Gross per 24 hour  Intake 600 ml  Output 850 ml  Net -250 ml   Filed Weights   06/29/19 1800  Weight: 88.9 kg    Examination:  Physical Exam Vitals and nursing note reviewed.  Constitutional:      Appearance: Normal appearance.  HENT:     Head: Normocephalic and atraumatic.  Eyes:     Conjunctiva/sclera: Conjunctivae normal.  Cardiovascular:     Rate and Rhythm: Normal rate and regular rhythm.  Pulmonary:     Effort: Pulmonary effort is normal.     Breath sounds: Normal breath sounds.  Abdominal:     General: Abdomen is flat.     Palpations: Abdomen is soft.  Musculoskeletal:     Comments: Bilateral foot Ace wrap in place  Skin:    Coloration: Skin is not jaundiced or pale.  Neurological:     Mental Status: He is alert. Mental status is at baseline.  Psychiatric:        Mood and Affect: Mood normal.        Behavior: Behavior normal.     Data Reviewed: I have personally reviewed following labs and imaging studies  CBC: Recent Labs  Lab 06/28/19 1010 06/29/19 1048 06/30/19 0543 07/01/19 0538  WBC 14.9* 13.7* 14.1* 12.4*  NEUTROABS 11.3* 9.8*  --   --   HGB 11.3* 10.7* 9.4* 9.2*  HCT 34.5* 34.7* 30.7* 30.2*  MCV 84.3 88.1 89.2 90.1  PLT 273.0 303 288 997   Basic Metabolic Panel: Recent Labs  Lab 06/28/19 1010 06/29/19 1048 06/30/19 0543 07/01/19 0538  NA 138 140 141 140  K 4.1 4.0 3.9 4.5  CL 103 107 111 112*  CO2 28 25 23 23   GLUCOSE 214* 171* 143* 80  BUN 21 18 21* 27*  CREATININE 2.03* 1.94* 1.89* 2.05*    CALCIUM 8.8 8.5* 8.2* 8.4*   GFR: Estimated Creatinine Clearance: 43.8 mL/min (A) (by C-G formula based on SCr of 2.05 mg/dL (  H)). Liver Function Tests: Recent Labs  Lab 06/29/19 1048 06/30/19 0543  AST 11* 9*  ALT 15 13  ALKPHOS 78 63  BILITOT 0.4 0.6  PROT 7.3 6.0*  ALBUMIN 2.9* 2.4*   No results for input(s): LIPASE, AMYLASE in the last 168 hours. No results for input(s): AMMONIA in the last 168 hours. Coagulation Profile: Recent Labs  Lab 06/30/19 0543  INR 1.1   Cardiac Enzymes: No results for input(s): CKTOTAL, CKMB, CKMBINDEX, TROPONINI in the last 168 hours. BNP (last 3 results) No results for input(s): PROBNP in the last 8760 hours. HbA1C: Recent Labs    06/29/19 1048  HGBA1C 8.8*   CBG: Recent Labs  Lab 06/30/19 1153 06/30/19 1719 06/30/19 2043 07/01/19 0819 07/01/19 1147  GLUCAP 99 100* 96 76 93   Lipid Profile: No results for input(s): CHOL, HDL, LDLCALC, TRIG, CHOLHDL, LDLDIRECT in the last 72 hours. Thyroid Function Tests: Recent Labs    06/29/19 1048  TSH 1.770   Anemia Panel: No results for input(s): VITAMINB12, FOLATE, FERRITIN, TIBC, IRON, RETICCTPCT in the last 72 hours. Sepsis Labs: No results for input(s): PROCALCITON, LATICACIDVEN in the last 168 hours.  Recent Results (from the past 240 hour(s))  WOUND CULTURE     Status: None   Collection Time: 06/28/19  4:00 PM   Specimen: Wound  Result Value Ref Range Status   MICRO NUMBER: [id number]  Final   SPECIMEN QUALITY: Adequate  Final   SOURCE: FOOT, LEFT  Final   STATUS: FINAL  Final   GRAM STAIN:   Final    No white blood cells seen Few epithelial cells Few Gram positive cocci in pairs   RESULT: No Growth  Final  MRSA PCR Screening     Status: None   Collection Time: 06/29/19 12:04 PM   Specimen: Nasal Mucosa; Nasopharyngeal  Result Value Ref Range Status   MRSA by PCR NEGATIVE NEGATIVE Final    Comment:        The GeneXpert MRSA Assay (FDA approved for NASAL  specimens only), is one component of a comprehensive MRSA colonization surveillance program. It is not intended to diagnose MRSA infection nor to guide or monitor treatment for MRSA infections. Performed at [facility], [place] [address], [place], [place] [address]   SARS CORONAVIRUS 2 (TAT 6-24 HRS) Nasopharyngeal Nasopharyngeal Swab     Status: None   Collection Time: 06/29/19 12:04 PM   Specimen: Nasopharyngeal Swab  Result Value Ref Range Status   SARS Coronavirus 2 NEGATIVE NEGATIVE Final    Comment: (NOTE) SARS-CoV-2 target nucleic acids are NOT DETECTED. The SARS-CoV-2 RNA is generally detectable in upper and lower respiratory specimens during the acute phase of infection. Negative results do not preclude SARS-CoV-2 infection, do not rule out co-infections with other pathogens, and should not be used as the sole basis for treatment or other patient management decisions. Negative results must be combined with clinical observations, patient history, and epidemiological information. The expected result is Negative. Fact Sheet for Patients: SugarRoll.be Fact Sheet for Healthcare Providers: https://www.woods-mathews.com/ This test is not yet approved or cleared by the [place] FDA and  has been authorized for detection and/or diagnosis of SARS-CoV-2 by FDA under an Emergency Use Authorization (EUA). This EUA will remain  in effect (meaning this test can be used) for the duration of the COVID-19 declaration under Section 56 4(b)(1) of the Act, 21 U.S.C. section 360bbb-3(b)(1), unless the authorization is terminated or revoked sooner. Performed at [facility], [place] [address], [place], [place] [address]   Respiratory Panel by RT PCR (Flu A&B, Covid) - Nasopharyngeal Swab     Status: None   Collection Time: 06/29/19  4:26 PM   Specimen: Nasopharyngeal Swab  Result Value Ref Range Status   SARS Coronavirus 2  by RT PCR NEGATIVE NEGATIVE Final    Comment: (NOTE) SARS-CoV-2 target nucleic acids are NOT DETECTED. The SARS-CoV-2 RNA is generally detectable in upper respiratoy specimens during the acute phase of infection. The lowest concentration of SARS-CoV-2 viral copies this assay can detect is 131 copies/mL. A negative result does not preclude SARS-Cov-2 infection and should not be used as the sole basis for treatment or other patient management decisions. A negative result may occur with  improper specimen collection/handling, submission of specimen other than nasopharyngeal swab, presence of viral mutation(s) within the areas targeted by this assay, and inadequate number of viral copies (<131 copies/mL). A negative result must be combined with clinical observations, patient history, and epidemiological information. The expected result is Negative. Fact Sheet for Patients:  PinkCheek.be Fact Sheet for Healthcare Providers:  GravelBags.it This test is not yet ap proved or cleared by the [place] FDA and  has been authorized for detection and/or diagnosis of SARS-CoV-2 by FDA under an Emergency Use Authorization (EUA). This EUA will remain  in effect (meaning this test can be used) for the duration of the COVID-19 declaration under Section 564(b)(1) of the Act, 21 U.S.C. section 360bbb-3(b)(1), unless the authorization is terminated or revoked sooner.    Influenza A by PCR NEGATIVE NEGATIVE Final   Influenza B by PCR NEGATIVE NEGATIVE Final    Comment: (NOTE) The Xpert Xpress SARS-CoV-2/FLU/RSV assay is intended as an aid in  the diagnosis of influenza from Nasopharyngeal swab specimens and  should not be used as a sole basis for treatment. Nasal washings and  aspirates are unacceptable for Xpert Xpress SARS-CoV-2/FLU/RSV  testing. Fact Sheet for Patients: PinkCheek.be Fact Sheet for Healthcare  Providers: GravelBags.it This test is not yet approved or cleared by the [place] FDA and  has been authorized for detection and/or diagnosis of SARS-CoV-2 by  FDA under an Emergency Use Authorization (EUA). This EUA will remain  in effect (meaning this test can be used) for the duration of the  Covid-19 declaration under Section 564(b)(1) of the Act, 21  U.S.C. section 360bbb-3(b)(1), unless the authorization is  terminated or revoked. Performed at [facility], [place] [address] [place], [place] [address]          Radiology Studies: MR FOOT LEFT W WO CONTRAST  Result Date: 06/29/2019 CLINICAL DATA:  Foot pain and swelling. EXAM: MRI OF THE LEFT FOREFOOT WITHOUT AND WITH CONTRAST TECHNIQUE: Multiplanar, multisequence MR imaging of the left foot was performed both before and after administration of intravenous contrast. 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 Chronic deformity of the second proximal phalanx. I do not see any findings suspicious for septic arthritis or osteomyelitis. 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 There appears to be a large open wound on the plantar aspect of the midfoot with possible surrounding blister formation. I do not see a discrete drainable soft tissue abscess or significant cellulitis or myofasciitis. IMPRESSION: 1. Large open wound on the plantar aspect of the midfoot with possible surrounding blister formation. No discrete drainable soft tissue abscess or significant cellulitis. 2.  No findings suspicious for septic arthritis or osteomyelitis. 3. 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 Electronically Signed   By: Marijo Sanes M.D.   On: 06/29/2019 18:39        Scheduled Meds: . amLODipine  10 mg Oral Daily  . atorvastatin  20 mg  Oral Daily  . carvedilol  25 mg Oral BID WC  . enoxaparin (LOVENOX) injection  40 mg Subcutaneous Q24H  . escitalopram  10 mg Oral Daily  . feeding supplement (GLUCERNA SHAKE)  237 mL Oral TID BM  . ferrous sulfate  325 mg Oral Daily  . gabapentin  300 mg Oral TID  . insulin aspart  0-15 Units Subcutaneous TID WC  . insulin aspart  6 Units Subcutaneous TID WC  . insulin glargine  20 Units Subcutaneous Daily  . nutrition supplement (JUVEN)  1 packet Oral BID BM  . sodium chloride flush  3 mL Intravenous Q12H  . traZODone  50 mg Oral Once  . [START ON 07/04/2019] Vitamin D (Ergocalciferol)  50,000 Units Oral Weekly   Continuous Infusions: . ceFEPime (MAXIPIME) IV 2 g (07/01/19 0026)  . vancomycin 1,250 mg (06/30/19 1417)     Time spent: 26 minutes with over 50% of the time coordinating the patient's care    Harold Hedge, DO [facility] Pager [phone]  Call night coverage person covering after 7pm

## 2019-07-01 NOTE — Progress Notes (Signed)
    Durable Medical Equipment  (From admission, onward)         Start     Ordered   07/01/19 0953  For home use only DME standard manual wheelchair with seat cushion  Once    Comments: Patient suffers from left foot wound with wound vac and ambulatory dysfunction which impairs their ability to perform daily activities like dressing in the home.  A walker will not resolve issue with performing activities of daily living. A wheelchair will allow patient to safely perform daily activities. Patient can safely propel the wheelchair in the home or has a caregiver who can provide assistance. Length of need 6 months . Accessories: elevating leg rests (ELRs), wheel locks, extensions and anti-tippers.   07/01/19 1740

## 2019-07-01 NOTE — Progress Notes (Signed)
  Subjective:  Patient ID: [name], male    DOB: 01-07-1971,  MRN: [id number]  Seen bedside. In good spirits denies complaints. Objective:   Vitals:   06/30/19 2039 07/01/19 0547  BP: (!) 154/92 (!) 152/94  Pulse: 80 71  Resp: 18 20  Temp: 98.9 F (37.2 C) 98.3 F (36.8 C)  SpO2: 100% 100%   General AA&O x3. Normal mood and affect.  Vascular Dorsalis pedis and posterior tibial pulses 2/4 bilat. Brisk capillary refill to all digits. Pedal hair present.  Neurologic Epicritic sensation grossly intact.  Dermatologic Wound healing well, granular base, no signs of infeciton.  Orthopedic: Calf warm, no pain to palpation   Results for orders placed or performed during the hospital encounter of 06/29/19 (from the past 24 hour(s))  Glucose, capillary     Status: Abnormal   Collection Time: 06/30/19  7:53 AM  Result Value Ref Range   Glucose-Capillary 140 (H) 70 - 99 mg/dL  Glucose, capillary     Status: None   Collection Time: 06/30/19 11:53 AM  Result Value Ref Range   Glucose-Capillary 99 70 - 99 mg/dL  Glucose, capillary     Status: Abnormal   Collection Time: 06/30/19  5:19 PM  Result Value Ref Range   Glucose-Capillary 100 (H) 70 - 99 mg/dL   Comment 1 Notify RN   Glucose, capillary     Status: None   Collection Time: 06/30/19  8:43 PM  Result Value Ref Range   Glucose-Capillary 96 70 - 99 mg/dL  Basic metabolic panel     Status: Abnormal   Collection Time: 07/01/19  5:38 AM  Result Value Ref Range   Sodium 140 135 - 145 mmol/L   Potassium 4.5 3.5 - 5.1 mmol/L   Chloride 112 (H) 98 - 111 mmol/L   CO2 23 22 - 32 mmol/L   Glucose, Bld 80 70 - 99 mg/dL   BUN 27 (H) 6 - 20 mg/dL   Creatinine, Ser 2.05 (H) 0.61 - 1.24 mg/dL   Calcium 8.4 (L) 8.9 - 10.3 mg/dL   GFR calc non Af Amer 37 (L) >60 mL/min   GFR calc Af Amer 43 (L) >60 mL/min   Anion gap 5 5 - 15  CBC     Status: Abnormal   Collection Time: 07/01/19  5:38 AM  Result Value Ref Range   WBC 12.4 (H) 4.0 -  10.5 K/uL   RBC 3.35 (L) 4.22 - 5.81 MIL/uL   Hemoglobin 9.2 (L) 13.0 - 17.0 g/dL   HCT 30.2 (L) 39.0 - 52.0 %   MCV 90.1 80.0 - 100.0 fL   MCH 27.5 26.0 - 34.0 pg   MCHC 30.5 30.0 - 36.0 g/dL   RDW 14.6 11.5 - 15.5 %   Platelets 301 150 - 400 K/uL   nRBC 0.0 0.0 - 0.2 %    Assessment & Plan:  Patient was evaluated and treated and all questions answered.  Diabetic Foot Ulcer Left With Infection -Micro from 4/1 (office culture) pending -No believed osteomyelitis found intra-op. Nor suggested on MRI. [facility] removed today, no VAC sponge available for DPM to reapply. RN to reapply today. -Plan for d/c today with PO abx and outpatient f/u for VAC changes, assuming VAC has been delivered for home use.  [name], DPM  Accessible via secure chat for questions or concerns.

## 2019-07-01 NOTE — TOC Progression Note (Signed)
Transition of Care [facility]) - Progression Note    Patient Details  Name: [name] MRN: [id number] Date of Birth: January 17, 1971  [facility] CM/SW Contact  Joaquin Courts, RN Phone Number: 07/01/2019, 4:42 PM  Clinical Narrative:    CM spoke with [employer] rep Amy who states agency can provide [facility] services to patient only, agency cannot staff/provide HHPT services, however if MD was in agreement for [facility] services only then agency will accept patient. CM notified MD of this.  Medicaid prior-authorization form for wound vac submitted to [employer] rep.  Neither medicaid nor [employer] are open on Sunday, request will have to wait until Monday to process.  Additionally, patient was ordered for wheelchair and referral was given to [facility], however when rep ran patient's insurance it stated not eligible for wheelchair.  CM contacted weekday rep Thedore Mins with request to verify eligibility and work on resolving issue with insurance once Medicaid is open on Monday.      Expected Discharge Plan: [place] Barriers to Discharge: Continued Medical Work up  Expected Discharge Plan and Services Expected Discharge Plan: [place]   Discharge Planning Services: CM Consult Post Acute Care Choice: [place] arrangements for the past 2 months: Single Family Home                 DME Arranged: Negative pressure wound device DME Agency: [employer] Date DME Agency Contacted: 06/30/19 Time DME Agency Contacted: 1400 Representative spoke with at DME Agency: faxed to number on forms, voicemail left for tracy HH Arranged: RN [place] Agency: [facility] Date [place]: 07/01/19 Time [place]: [place] Representative spoke with at [place]: Amy   Social Determinants of Health ([place]) Interventions    Readmission Risk Interventions No flowsheet data found.

## 2019-07-01 NOTE — Progress Notes (Signed)
Pharmacy Antibiotic Note  [name] is a 49 y.o. male admitted on 06/29/2019 with diabetic foot infection.  MRI ordered to rule out osteomyelitis. Pharmacy has been consulted for Vancomycin & Cefepime dosing.  D3 Vanc/cefepime SCr 2.05 rising CrCl 44 WBC 12.4 improving  Afebrile  Plan:  Due to rising SCr, change vanc from 1250mg  IV q24 to 750mg  IV q24 - goal AUC 400-550  Continue same cefepime dosing - 2g IV q12   Height: 5\' 4"  (162.6 cm) Weight: 88.9 kg (195 lb 15.8 oz) IBW/kg (Calculated) : 59.2  Temp (24hrs), Avg:98.4 F (36.9 C), Min:98.1 F (36.7 C), Max:98.9 F (37.2 C)  Recent Labs  Lab 06/28/19 1010 06/29/19 1048 06/30/19 0543 07/01/19 0538  WBC 14.9* 13.7* 14.1* 12.4*  CREATININE 2.03* 1.94* 1.89* 2.05*    Estimated Creatinine Clearance: 43.8 mL/min (A) (by C-G formula based on SCr of 2.05 mg/dL (H)).    Allergies  Allergen Reactions  . Bee Venom Swelling    SWELLING REACTION UNSPECIFIED   . Penicillins Hives and Other (See Comments)    PATIENT HAS HAD A PCN REACTION WITH IMMEDIATE RASH, FACIAL/TONGUE/THROAT SWELLING, SOB, OR LIGHTHEADEDNESS WITH HYPOTENSION:  **Can tolerate cephalosporins**    Antimicrobials this admission: 4/2 Cefepime >>  4/2 Vancomycin >>   Dose adjustments this admission:  Microbiology results:  Thank you for allowing pharmacy to be a part of this patient's care.  Kara Mead PharmD, BCPS 07/01/2019 1:17 PM

## 2019-07-01 NOTE — Progress Notes (Signed)
Physical Therapy Treatment Patient Details Name: Joseph Joseph MRN: [id number] DOB: Nov 17, 1970 Today's Date: 07/01/2019    History of Present Illness Pt s/p I&D of L foot with wound vac placement.  Pt with hx of uncontrolled DM and toe amputations bil feet.    PT Comments    Pt continues very motivated/cooperative but struggling to follow through with NWB on L LE.  Have requested use of wc in home - home is wc accessible as pt's significant other does use a wc.  Follow up home health services have been difficult to secure for pt 2* payor source but Case Manager has found an agency to provide the essential follow up wound care pt needs (pt transporting several times a week to physician's office as an alternative is not practical for pt).  Per Case Management  [facility] agency "found a [facility] Joseph agency for the patient but they will only provide Joseph Joseph, they will decline if he needs HHPT" but "MD will not remove the HHPT from the order unless you say that he is ok without HHPT".  Case Manager has clarified that "the [facility] rep did say that they can encorporate some PT aspects into the RN visits, so i can ask them to work with him on this but they cannot send a PT because of payment issues with medicaid" and "i will contact the md again and ask if he could change his order once your note is in and i will let him know i have requests Joseph [facility] agency to include NWB training during RN visits".  With this information, HHPT recommendation has been removed in order that this pt can receive the critical wound care treatment he requires.     Follow Up Recommendations  No PT follow up     Equipment Recommendations  Wheelchair (measurements PT)    Recommendations for Other Services       Precautions / Restrictions Precautions Precautions: Fall Required Braces or Orthoses: Other Brace Other Brace: air walker boot L foot Restrictions Weight Bearing Restrictions: Yes LLE Weight Bearing: Non weight bearing Other  Position/Activity Restrictions: NWB L foot    Mobility  Bed Mobility                  Transfers Overall transfer level: Needs assistance Equipment used: Rolling walker (2 wheeled) Transfers: Sit to/from Stand Sit to Stand: Min guard         General transfer comment: cues for safety, LE management to minimize WB on L LE and use of UEs to self assist  Ambulation/Gait Ambulation/Gait assistance: Min assist;Min guard Gait Distance (Feet): 32 Feet Assistive device: Rolling walker (2 wheeled) Gait Pattern/deviations: Step-to pattern;Decreased step length - right;Decreased step length - left;Shuffle;Trunk flexed Gait velocity: decr   General Gait Details: cues for sequence, posture, increased UE WB for NWB, and safety;  Physical assist for balance and support.  Pt continues to struggle with NWB on L LE   Stairs             Wheelchair Mobility    Modified Rankin (Stroke Patients Only)       Balance Overall balance assessment: Needs assistance Sitting-balance support: No upper extremity supported;Feet supported Sitting balance-Leahy Scale: Good     Standing balance support: Bilateral upper extremity supported Standing balance-Leahy Scale: Poor Standing balance comment: dependent on BUE for standing and cues to maintain WB precautions  Cognition Arousal/Alertness: Awake/alert Behavior During Therapy: Impulsive;WFL for tasks assessed/performed   Area of Impairment: Safety/judgement                         Safety/Judgement: Decreased awareness of deficits;Decreased awareness of safety     General Comments: Pt sitting up on window - states he made it over from bed without use of RW but it was close so he didnt put weight on L LE      Exercises      General Comments        Pertinent Vitals/Pain Pain Assessment: No/denies pain    Home Living                      Prior Function             PT Goals (current goals can now be found in the care plan section) Acute Rehab PT Goals Patient Stated Goal: Regain IND PT Goal Formulation: With patient Time For Goal Achievement: 07/14/19 Potential to Achieve Goals: Fair Progress towards PT goals: Progressing toward goals    Frequency    Min 3X/week      PT Plan Discharge plan needs to be updated    Co-evaluation              AM-PAC PT "6 Clicks" Mobility   Outcome Measure  Help needed turning from your back to your side while in a flat bed without using bedrails?: None Help needed moving from lying on your back to sitting on the side of a flat bed without using bedrails?: None Help needed moving to and from a bed to a chair (including a wheelchair)?: A Little Help needed standing up from a chair using your arms (e.g., wheelchair or bedside chair)?: A Little Help needed to walk in hospital room?: A Little Help needed climbing 3-5 steps with a railing? : A Lot 6 Click Score: 19    End of Session Equipment Utilized During Treatment: Gait belt Activity Tolerance: Patient tolerated treatment well;Patient limited by fatigue Patient left: in chair;with call bell/phone within reach;with chair alarm set Nurse Communication: Mobility status PT Visit Diagnosis: Difficulty in walking, not elsewhere classified (R26.2)     Time: 6761-9509 PT Time Calculation (min) (ACUTE ONLY): 22 min  Charges:  $Gait Training: 8-22 mins                     [place] Pager [phone] Office [phone]    [name] 07/01/2019, 1:43 PM

## 2019-07-02 LAB — BASIC METABOLIC PANEL
Anion gap: 4 — ABNORMAL LOW (ref 5–15)
BUN: 27 mg/dL — ABNORMAL HIGH (ref 6–20)
CO2: 22 mmol/L (ref 22–32)
Calcium: 8.4 mg/dL — ABNORMAL LOW (ref 8.9–10.3)
Chloride: 112 mmol/L — ABNORMAL HIGH (ref 98–111)
Creatinine, Ser: 2 mg/dL — ABNORMAL HIGH (ref 0.61–1.24)
GFR calc Af Amer: 44 mL/min — ABNORMAL LOW (ref >60)
GFR calc non Af Amer: 38 mL/min — ABNORMAL LOW (ref >60)
Glucose, Bld: 92 mg/dL (ref 70–99)
Potassium: 4.2 mmol/L (ref 3.5–5.1)
Sodium: 138 mmol/L (ref 135–145)

## 2019-07-02 LAB — GLUCOSE, CAPILLARY
Glucose-Capillary: 70 mg/dL (ref 70–99)
Glucose-Capillary: 139 mg/dL — ABNORMAL HIGH (ref 70–99)

## 2019-07-02 MED ORDER — LANTUS SOLOSTAR 100 UNIT/ML ~~LOC~~ SOPN: 15.0000 [IU] | PEN_INJECTOR | Freq: Every day | SUBCUTANEOUS | 1 refills | Status: DC

## 2019-07-02 MED ORDER — AMLODIPINE BESYLATE 10 MG PO TABS: 10.0000 mg | ORAL_TABLET | Freq: Every day | ORAL | 0 refills | Status: DC

## 2019-07-02 MED ORDER — INSULIN PEN NEEDLE 32G X 4 MM MISC: 30.0000 | Freq: Every day | 1 refills | Status: DC

## 2019-07-02 MED ORDER — CEFUROXIME AXETIL 500 MG PO TABS: 500.0000 mg | ORAL_TABLET | Freq: Two times a day (BID) | ORAL | 0 refills | Status: AC

## 2019-07-02 MED ORDER — INSULIN PEN NEEDLE 32G X 4 MM MISC: 1.0000 | Freq: Every day | 1 refills | Status: DC

## 2019-07-02 NOTE — TOC Transition Note (Signed)
Transition of Care [facility]) - CM/SW Discharge Note   Patient Details  Name: [name] MRN: [id number] Date of Birth: 03-10-1971  [facility] of [facility] CM/SW Contact:  Lennart Pall, LCSW Phone Number: 07/02/2019, 2:09 PM   Clinical Narrative:   Have confirmed that [facility] for home has been delivered to pt's room and HHRN/PT set via [facility].  No further needs.    Final next level of care: [place] Barriers to Discharge: Barriers Resolved   Patient Goals and CMS Choice Patient states their goals for this hospitalization and ongoing recovery are:: to go home CMS Medicare.gov Compare Post Acute Care list provided to:: Patient Choice offered to / list presented to : Patient  Discharge Placement                       Discharge Plan and Services   Discharge Planning Services: CM Consult Post Acute Care Choice: Home Health          DME Arranged: Negative pressure wound device DME Agency: [employer] Date DME Agency Contacted: 06/30/19 Time DME Agency Contacted: 1400 Representative spoke with at DME Agency: confirmed order being processed with Leontine Locket HH Arranged: PT, RN [facility] Agency: [facility] Date [place]: 07/01/19 Time [place]: [phone] Representative spoke with at [place]: Amy  Social Determinants of Health ([place]) Interventions     Readmission Risk Interventions No flowsheet data found.

## 2019-07-02 NOTE — Discharge Summary (Addendum)
Physician Discharge Summary  [name] ZOX:[id number] DOB: 1970/03/30   PCP: Joseph Anger, MD  Admit date: 06/29/2019 Discharge date: 07/02/2019 Length of Stay: 3 days   Code Status: Full Code  Admitted From:  Home Discharged to:   [place]: RN  Equipment/Devices: Wound Vac Discharge Condition:  Stable  Recommendations for Outpatient Follow-up   1. Follow up with PCP in 1 week and discuss BP and episode of hypoglycemia as well as follow up on renal function. Had improved AKI and holding Lotrel at DC. Discharged with Lantus 15 u and continued Metformin 2. Follow up BMP/CBC  3. 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 Patient was seen by [name] on 3/30 diagnosed with infected ulcer left foot ulcer with concern for osteomyelitis.  At that time patient was started on oral doxycycline with plans for further intervention. He was recommended a BKA, however desired a second opinion and followed up with primary care on 4/1 and was referred toDr [name], patients podiatrist. After podiatry evaluation patient was referred for admission for IV antibiotics, further imaging with MRI with plans foraggressive woundcare and further surgical intervention.   4/2: No evidence of Osteomyelitis on MRI. Underwent debridement of left foot ulcer and application of wound VAC 4/4: hypoglycemic episode requiring 1 amp dextrose 4/5: discharged in stable condition with wound Vac. Discussed antibiotics with ID, ok to discharge on Ceftin 500 mg bid for total 14 days antibiotics  (last dose 4/16) for gram positive cocci in pairs on gram stain.   A & P   Principal Problem:   Diabetic infection of left foot ([place]) Active Problems:   Diabetic foot ulcer ([place])    1. Left diabetic foot ulcer without osteomyelitis, s/p debridement and wound vac (06/29/19 with Joseph. March [name]) with gram-positive cocci in pairs a. Discussed with ID: Ceftin 500 mg bid x two weeks total antibiotics from procedure (last dose on 4/16) b. Wound vac  c. Follow up with podiatry this week  2. AKI on CKD 3a a. Improved, not resolved b. Continue holding home Lotrel at discharge and changed to Amlodipine, continued Coreg c. Follow up BMP outpatient  3. Type 2 Diabetes with hypoglycemia a. Hypoglycemia with POC glucose to 40, resolved with 1 amp dextrose and PO intake b. Discharged with Lantus 15 u daily and home metformin which should be more affordable with insurance, DC'd [place] c. Follow up with pcp  4. Hypertension a. Hold home Lotrel  b. Continue Amlodipine 10 mg (started this stay) and Coreg  5. Hyperlipidemia a. Continue statin  6. Tobacco use a. Recommend cessation    Consultants  . Podiatry  Procedures  Left diabetic foot ulcer without osteomyelitis, s/p debridement and wound vac (06/29/19 with Joseph. March [name])  Antibiotics   Anti-infectives (From admission, onward)   Start     Dose/Rate Route Frequency Ordered Stop   07/02/19 0000  cefUROXime (CEFTIN) 500 MG tablet     500 mg Oral 2 times daily with meals 07/02/19 1321 07/13/19 2359   07/01/19 1400  vancomycin (VANCOREADY) IVPB 750 mg/150 mL     750 mg 150  mL/hr over 60 Minutes Intravenous Every 24 hours 07/01/19 1317     06/30/19 1400  vancomycin (VANCOREADY) IVPB 1250 mg/250 mL  Status:  Discontinued     1,250 mg 166.7 mL/hr over 90 Minutes Intravenous Every 24 hours 06/29/19 1129 07/01/19 1317   06/29/19 2004  vancomycin (VANCOCIN) powder  Status:  Discontinued       As needed 06/29/19 2004 06/29/19 2105   06/29/19  1200  ceFEPIme (MAXIPIME) 2 g in sodium chloride 0.9 % 100 mL IVPB     2 g 200 mL/hr over 30 Minutes Intravenous Every 12 hours 06/29/19 1118     06/29/19 1130  vancomycin (VANCOREADY) IVPB 2000 mg/400 mL     2,000 mg 200 mL/hr over 120 Minutes Intravenous  Once 06/29/19 1118 06/29/19 1515       Subjective  Patient seen and examined at bedside no acute distress and resting comfortably.  No events overnight.  Tolerating diet. In good spirits and anticipating discharge.   Denies any chest pain, shortness of breath, fever, nausea, vomiting, urinary or bowel complaints. Otherwise ROS negative    Objective   Discharge Exam: Vitals:   07/02/19 0539 07/02/19 1328  BP: (!) 158/93 (!) 143/83  Pulse: 74 78  Resp: 17 18  Temp: 97.6 F (36.4 C) 98.4 F (36.9 C)  SpO2: 99% 99%   Vitals:   07/01/19 2104 07/02/19 0237 07/02/19 0539 07/02/19 1328  BP: (!) 170/94 139/87 (!) 158/93 (!) 143/83  Pulse: 72 75 74 78  Resp: 18 20 17 18   Temp: (!) 97.5 F (36.4 C) 97.8 F (36.6 C) 97.6 F (36.4 C) 98.4 F (36.9 C)  TempSrc: Oral Oral Oral Oral  SpO2: 97% 98% 99% 99%  Weight:      Height:        Physical Exam Vitals and nursing note reviewed.  Constitutional:      Appearance: Normal appearance.  HENT:     Head: Normocephalic and atraumatic.  Eyes:     Conjunctiva/sclera: Conjunctivae normal.  Cardiovascular:     Rate and Rhythm: Normal rate and regular rhythm.  Pulmonary:     Effort: Pulmonary effort is normal.     Breath sounds: Normal breath sounds.  Abdominal:     General: Abdomen is flat.     Palpations: Abdomen is soft.  Musculoskeletal:     Comments: Left foot with wound vac, no sign of worsening infection  Skin:    Coloration: Skin is not jaundiced or pale.  Neurological:     Mental Status: He is alert. Mental status is at baseline.  Psychiatric:        Mood and Affect: Mood normal.        Behavior: Behavior normal.       The results of significant diagnostics  from this hospitalization (including imaging, microbiology, ancillary and laboratory) are listed below for reference.     Microbiology: Recent Results (from the past 240 hour(s))  WOUND CULTURE     Status: None   Collection Time: 06/28/19  4:00 PM   Specimen: Wound  Result Value Ref Range Status   MICRO NUMBER: [id number]  Final   SPECIMEN QUALITY: Adequate  Final   SOURCE: FOOT, LEFT  Final   STATUS: FINAL  Final   GRAM STAIN:   Final    No white blood cells seen Few epithelial cells Few Gram positive cocci in pairs   RESULT: No Growth  Final  MRSA PCR Screening     Status:  None   Collection Time: 06/29/19 12:04 PM   Specimen: Nasal Mucosa; Nasopharyngeal  Result Value Ref Range Status   MRSA by PCR NEGATIVE NEGATIVE Final    Comment:        The GeneXpert MRSA Assay (FDA approved for NASAL specimens only), is one component of a comprehensive MRSA colonization surveillance program. It is not intended to diagnose MRSA infection nor to guide or monitor treatment for MRSA infections. Performed at [facility], [place], [place] [address] [place], [place] [address]   SARS CORONAVIRUS 2 (TAT 6-24 HRS) Nasopharyngeal Nasopharyngeal Swab     Status: None   Collection Time: 06/29/19 12:04 PM   Specimen: Nasopharyngeal Swab  Result Value Ref Range Status   SARS Coronavirus 2 NEGATIVE NEGATIVE Final    Comment: (NOTE) SARS-CoV-2 target nucleic acids are NOT DETECTED. The SARS-CoV-2 RNA is generally detectable in upper and lower respiratory specimens during the acute phase of infection. Negative results do not preclude SARS-CoV-2 infection, do not rule out co-infections with other pathogens, and should not be used as the sole basis for treatment or other patient management decisions. Negative results must be combined with clinical observations, patient history, and epidemiological information. The expected result is Negative. Fact Sheet for  Patients: SugarRoll.be Fact Sheet for Healthcare Providers: https://www.woods-mathews.com/ This test is not yet approved or cleared by the [place] FDA and  has been authorized for detection and/or diagnosis of SARS-CoV-2 by FDA under an Emergency Use Authorization (EUA). This EUA will remain  in effect (meaning this test can be used) for the duration of the COVID-19 declaration under Section 56 4(b)(1) of the Act, 21 U.S.C. section 360bbb-3(b)(1), unless the authorization is terminated or revoked sooner. Performed at [facility], [place] [address], [place], [place] [address]   Respiratory Panel by RT PCR (Flu A&B, Covid) - Nasopharyngeal Swab     Status: None   Collection Time: 06/29/19  4:26 PM   Specimen: Nasopharyngeal Swab  Result Value Ref Range Status   SARS Coronavirus 2 by RT PCR NEGATIVE NEGATIVE Final    Comment: (NOTE) SARS-CoV-2 target nucleic acids are NOT DETECTED. The SARS-CoV-2 RNA is generally detectable in upper respiratoy specimens during the acute phase of infection. The lowest concentration of SARS-CoV-2 viral copies this assay can detect is 131 copies/mL. A negative result does not preclude SARS-Cov-2 infection and should not be used as the sole basis for treatment or other patient management decisions. A negative result may occur with  improper specimen collection/handling, submission of specimen other than nasopharyngeal swab, presence of viral mutation(s) within the areas targeted by this assay, and inadequate number of viral copies (<131 copies/mL). A negative result must be combined with clinical observations, patient history, and epidemiological information. The expected result is Negative. Fact Sheet for Patients:  PinkCheek.be Fact Sheet for Healthcare Providers:  GravelBags.it This test is not yet ap proved or cleared by the [place] FDA  and  has been authorized for detection and/or diagnosis of SARS-CoV-2 by FDA under an Emergency Use Authorization (EUA). This EUA will remain  in effect (meaning this test can be used) for the duration of the COVID-19 declaration under Section 564(b)(1) of the Act, 21 U.S.C. section 360bbb-3(b)(1), unless the authorization is terminated or revoked sooner.    Influenza A by PCR NEGATIVE NEGATIVE Final   Influenza B by PCR NEGATIVE NEGATIVE Final    Comment: (NOTE) The Xpert Xpress SARS-CoV-2/FLU/RSV assay is intended as an aid in  the diagnosis  of influenza from Nasopharyngeal swab specimens and  should not be used as a sole basis for treatment. Nasal washings and  aspirates are unacceptable for Xpert Xpress SARS-CoV-2/FLU/RSV  testing. Fact Sheet for Patients: PinkCheek.be Fact Sheet for Healthcare Providers: GravelBags.it This test is not yet approved or cleared by the [place] FDA and  has been authorized for detection and/or diagnosis of SARS-CoV-2 by  FDA under an Emergency Use Authorization (EUA). This EUA will remain  in effect (meaning this test can be used) for the duration of the  Covid-19 declaration under Section 564(b)(1) of the Act, 21  U.S.C. section 360bbb-3(b)(1), unless the authorization is  terminated or revoked. Performed at [facility], [place] [address], [place], [place] [address]      Labs: BNP (last 3 results) No results for input(s): BNP in the last 8760 hours. Basic Metabolic Panel: Recent Labs  Lab 06/28/19 1010 06/29/19 1048 06/30/19 0543 07/01/19 0538 07/02/19 0542  NA 138 140 141 140 138  K 4.1 4.0 3.9 4.5 4.2  CL 103 107 111 112* 112*  CO2 28 25 23 23 22   GLUCOSE 214* 171* 143* 80 92  BUN 21 18 21* 27* 27*  CREATININE 2.03* 1.94* 1.89* 2.05* 2.00*  CALCIUM 8.8 8.5* 8.2* 8.4* 8.4*   Liver Function Tests: Recent Labs  Lab 06/29/19 1048 06/30/19 0543  AST  11* 9*  ALT 15 13  ALKPHOS 78 63  BILITOT 0.4 0.6  PROT 7.3 6.0*  ALBUMIN 2.9* 2.4*   No results for input(s): LIPASE, AMYLASE in the last 168 hours. No results for input(s): AMMONIA in the last 168 hours. CBC: Recent Labs  Lab 06/28/19 1010 06/29/19 1048 06/30/19 0543 07/01/19 0538  WBC 14.9* 13.7* 14.1* 12.4*  NEUTROABS 11.3* 9.8*  --   --   HGB 11.3* 10.7* 9.4* 9.2*  HCT 34.5* 34.7* 30.7* 30.2*  MCV 84.3 88.1 89.2 90.1  PLT 273.0 303 288 301   Cardiac Enzymes: No results for input(s): CKTOTAL, CKMB, CKMBINDEX, TROPONINI in the last 168 hours. BNP: Invalid input(s): POCBNP CBG: Recent Labs  Lab 07/01/19 1534 07/01/19 1626 07/01/19 2101 07/02/19 0726 07/02/19 1118  GLUCAP 149* 116* 179* 70 139*   D-Dimer No results for input(s): DDIMER in the last 72 hours. Hgb A1c No results for input(s): HGBA1C in the last 72 hours. Lipid Profile No results for input(s): CHOL, HDL, LDLCALC, TRIG, CHOLHDL, LDLDIRECT in the last 72 hours. Thyroid function studies No results for input(s): TSH, T4TOTAL, T3FREE, THYROIDAB in the last 72 hours.  Invalid input(s): FREET3 Anemia work up No results for input(s): VITAMINB12, FOLATE, FERRITIN, TIBC, IRON, RETICCTPCT in the last 72 hours. Urinalysis    Component Value Date/Time   COLORURINE YELLOW 10/17/2018 1441   APPEARANCEUR CLEAR 10/17/2018 1441   LABSPEC >=1.030 (A) 10/17/2018 1441   PHURINE 5.0 10/17/2018 1441   GLUCOSEU >=1000 (A) 10/17/2018 1441   HGBUR MODERATE (A) 10/17/2018 1441   BILIRUBINUR NEGATIVE 10/17/2018 1441   KETONESUR NEGATIVE 10/17/2018 1441   PROTEINUR >=300 (A) 05/25/2017 1300   UROBILINOGEN 0.2 10/17/2018 1441   NITRITE NEGATIVE 10/17/2018 1441   LEUKOCYTESUR NEGATIVE 10/17/2018 1441   Sepsis Labs Invalid input(s): PROCALCITONIN,  WBC,  LACTICIDVEN Microbiology Recent Results (from the past 240 hour(s))  WOUND CULTURE     Status: None   Collection Time: 06/28/19  4:00 PM   Specimen: Wound   Result Value Ref Range Status   MICRO NUMBER: [id number]  Final   SPECIMEN QUALITY: Adequate  Final   SOURCE: FOOT, LEFT  Final   STATUS: FINAL  Final   GRAM STAIN:   Final    No white blood cells seen Few epithelial cells Few Gram positive cocci in pairs   RESULT: No Growth  Final  MRSA PCR Screening     Status: None   Collection Time: 06/29/19 12:04 PM   Specimen: Nasal Mucosa; Nasopharyngeal  Result Value Ref Range Status   MRSA by PCR NEGATIVE NEGATIVE Final    Comment:        The GeneXpert MRSA Assay (FDA approved for NASAL specimens only), is one component of a comprehensive MRSA colonization surveillance program. It is not intended to diagnose MRSA infection nor to guide or monitor treatment for MRSA infections. Performed at [facility], [place] [address], [place], [place] [address]   SARS CORONAVIRUS 2 (TAT 6-24 HRS) Nasopharyngeal Nasopharyngeal Swab     Status: None   Collection Time: 06/29/19 12:04 PM   Specimen: Nasopharyngeal Swab  Result Value Ref Range Status   SARS Coronavirus 2 NEGATIVE NEGATIVE Final    Comment: (NOTE) SARS-CoV-2 target nucleic acids are NOT DETECTED. The SARS-CoV-2 RNA is generally detectable in upper and lower respiratory specimens during the acute phase of infection. Negative results do not preclude SARS-CoV-2 infection, do not rule out co-infections with other pathogens, and should not be used as the sole basis for treatment or other patient management decisions. Negative results must be combined with clinical observations, patient history, and epidemiological information. The expected result is Negative. Fact Sheet for Patients: SugarRoll.be Fact Sheet for Healthcare Providers: https://www.woods-mathews.com/ This test is not yet approved or cleared by the [place] FDA and  has been authorized for detection and/or diagnosis of SARS-CoV-2 by FDA under an Emergency Use  Authorization (EUA). This EUA will remain  in effect (meaning this test can be used) for the duration of the COVID-19 declaration under Section 56 4(b)(1) of the Act, 21 U.S.C. section 360bbb-3(b)(1), unless the authorization is terminated or revoked sooner. Performed at [facility], [place] [address] [place], [place] [address]   Respiratory Panel by RT PCR (Flu A&B, Covid) - Nasopharyngeal Swab     Status: None   Collection Time: 06/29/19  4:26 PM   Specimen: Nasopharyngeal Swab  Result Value Ref Range Status   SARS Coronavirus 2 by RT PCR NEGATIVE NEGATIVE Final    Comment: (NOTE) SARS-CoV-2 target nucleic acids are NOT DETECTED. The SARS-CoV-2 RNA is generally detectable in upper respiratoy specimens during the acute phase of infection. The lowest concentration of SARS-CoV-2 viral copies this assay can detect is 131 copies/mL. A negative result does not preclude SARS-Cov-2 infection and should not be used as the sole basis for treatment or other patient management decisions. A negative result may occur with  improper specimen collection/handling, submission of specimen other than nasopharyngeal swab, presence of viral mutation(s) within the areas targeted by this assay, and inadequate number of viral copies (<131 copies/mL). A negative result must be combined with clinical observations, patient history, and epidemiological information. The expected result is Negative. Fact Sheet for Patients:  PinkCheek.be Fact Sheet for Healthcare Providers:  GravelBags.it This test is not yet ap proved or cleared by the [place] FDA and  has been authorized for detection and/or diagnosis of SARS-CoV-2 by FDA under an Emergency Use Authorization (EUA). This EUA will remain  in effect (meaning this test can be used) for the duration of the COVID-19 declaration under Section 564(b)(1)  of the Act, 21 U.S.C. section 360bbb-3(b)(1),  unless the authorization is terminated or revoked sooner.    Influenza A by PCR NEGATIVE NEGATIVE Final   Influenza B by PCR NEGATIVE NEGATIVE Final    Comment: (NOTE) The Xpert Xpress SARS-CoV-2/FLU/RSV assay is intended as an aid in  the diagnosis of influenza from Nasopharyngeal swab specimens and  should not be used as a sole basis for treatment. Nasal washings and  aspirates are unacceptable for Xpert Xpress SARS-CoV-2/FLU/RSV  testing. Fact Sheet for Patients: PinkCheek.be Fact Sheet for Healthcare Providers: GravelBags.it This test is not yet approved or cleared by the [place] FDA and  has been authorized for detection and/or diagnosis of SARS-CoV-2 by  FDA under an Emergency Use Authorization (EUA). This EUA will remain  in effect (meaning this test can be used) for the duration of the  Covid-19 declaration under Section 564(b)(1) of the Act, 21  U.S.C. section 360bbb-3(b)(1), unless the authorization is  terminated or revoked. Performed at [facility], [place] [address], [place], [place] [address]     Discharge Instructions     Discharge Instructions    Diet - low sodium heart healthy   Complete by: As directed    Discharge instructions   Complete by: As directed    You were seen and examined in the hospital for a diabetic foot infection and cared for by a hospitalist.   Upon Discharge:  - Take Cefuroxime (Ceftin) twice daily for the next 11 days, do not skip doses. Make sure you take to completion. If you begin to have diarrhea then contact your primary care physician - Hold your Amlodipine-Benazepril (Lotrel), Take Amlodipine 10 mg daily instead - Get lab work this week and follow up with your primary care physician - check your blood sugar with meals. If your blood sugar is below 80 or you have symptoms of low blood sugar then have a sugary snack and recheck your blood sugar - make  an appointment with your Podiatrist this week Make an appointment with your primary care physician within 7 days Get lab work prior to your follow up appointment with your PCP Bring all home medications to your appointment to review Request that your primary physician go over all hospital tests and procedures/radiological results at the follow up.   Please get all hospital records sent to your physician by signing a hospital release before you go home.   Read the complete instructions along with all the possible side effects for all the medicines you take and that have been prescribed to you. Take any new medicines after you have completely understood and accept all the possible adverse reactions/side effects.   If you have any questions about your discharge medications or the care you received while you were in the hospital, you can call the unit and asked to speak with the hospitalist on call. Once you are discharged, your primary care physician will handle any further medical issues. Please note that NO REFILLS for any discharge medications will be authorized, as it is imperative that you return to your primary care physician (or establish a relationship with a primary care physician if you do not have one) for your aftercare needs so that they can reassess your need for medications and monitor your lab values.   Do not drive, operate heavy machinery, perform activities at heights, swimming or participation in water activities or provide baby sitting services if your were admitted for loss of consciousness/seizures or if you  are on sedating medications including, but not limited to benzodiazepines, sleep medications, narcotic pain medications, etc., until you have been cleared to do so by a medical doctor.   Do not take more than prescribed medications.   Wear a seat belt while driving.  If you have smoked or chewed Tobacco in the last 2 years please stop smoking; also stop any regular Alcohol  and/or any Recreational drug use including marijuana.  If you experience worsening of your admission symptoms or develop shortness of breath, chest pain, suicidal or homicidal thoughts or experience a life threatening emergency, you must seek medical attention immediately by calling 911 or calling your PCP immediately.   Increase activity slowly   Complete by: As directed      Allergies as of 07/02/2019      Reactions   Bee Venom Swelling   SWELLING REACTION UNSPECIFIED    Penicillins Hives, Other (See Comments)   PATIENT HAS HAD A PCN REACTION WITH IMMEDIATE RASH, FACIAL/TONGUE/THROAT SWELLING, SOB, OR LIGHTHEADEDNESS WITH HYPOTENSION:  **Can tolerate cephalosporins**      Medication List    STOP taking these medications   amLODipine-benazepril 5-20 MG capsule Commonly known as: Lotrel   doxycycline 100 MG tablet Commonly known as: VIBRA-TABS   Tresiba FlexTouch 100 UNIT/ML FlexTouch Pen Generic drug: insulin degludec     TAKE these medications   amLODipine 10 MG tablet Commonly known as: NORVASC Take 1 tablet (10 mg total) by mouth daily. Start taking on: July 03, 2019   aspirin EC 81 MG tablet Take 81 mg by mouth daily.   atorvastatin 20 MG tablet Commonly known as: LIPITOR Take 1 tablet (20 mg total) by mouth daily.   b complex vitamins tablet Take 1 tablet by mouth daily.   blood glucose meter kit and supplies Kit Dispense based on patient and insurance preference. Use up to four times daily as directed. (FOR ICD-9 250.00, 250.01).   carvedilol 25 MG tablet Commonly known as: Coreg Take 1 tablet (25 mg total) by mouth 2 (two) times daily.   cefUROXime 500 MG tablet Commonly known as: CEFTIN Take 1 tablet (500 mg total) by mouth 2 (two) times daily with a meal for 11 days.   escitalopram 10 MG tablet Commonly known as: Lexapro Take 1 tablet (10 mg total) by mouth at bedtime.   ferrous sulfate 325 (65 FE) MG tablet Take 1 tablet (325 mg total) by mouth  daily.   gabapentin 300 MG capsule Commonly known as: Neurontin Take 1 capsule (300 mg total) by mouth 3 (three) times daily. Patient needs office visit before refills will be [name]   Insulin Pen Needle 32G X 4 MM Misc 1 Device by Does not apply route daily. What changed: Another medication with the same name was added. Make sure you understand how and when to take each.   Insulin Pen Needle 32G X 4 MM Misc 30 each by Does not apply route daily. What changed: You were already taking a medication with the same name, and this prescription was added. Make sure you understand how and when to take each.   Insulin Pen Needle 32G X 4 MM Misc 1 each by Does not apply route daily. What changed: You were already taking a medication with the same name, and this prescription was added. Make sure you understand how and when to take each.   Lantus SoloStar 100 UNIT/ML Solostar Pen Generic drug: insulin glargine Inject 15 Units into the skin daily.   metFORMIN  500 MG tablet Commonly known as: GLUCOPHAGE Take 1 tablet (500 mg total) by mouth 2 (two) times daily with a meal.   mupirocin ointment 2 % Commonly known as: Bactroban Apply 1 application topically 2 (two) times daily.   Vitamin D3 50 MCG (2000 UT) capsule Take 1 capsule (2,000 Units total) by mouth daily.   Vitamin D3 1.25 MG (50000 UT) Caps Take 1 capsule by mouth once a week.            Durable Medical Equipment  (From admission, onward)         Start     Ordered   07/01/19 0953  For home use only DME standard manual wheelchair with seat cushion  Once    Comments: Patient suffers from left foot wound with wound vac and ambulatory dysfunction which impairs their ability to perform daily activities like dressing in the home.  A walker will not resolve issue with performing activities of daily living. A wheelchair will allow patient to safely perform daily activities. Patient can safely propel the wheelchair in the home or has  a caregiver who can provide assistance. Length of need 6 months . Accessories: elevating leg rests (ELRs), wheel locks, extensions and anti-tippers.   07/01/19 0953          Allergies  Allergen Reactions  . Bee Venom Swelling    SWELLING REACTION UNSPECIFIED   . Penicillins Hives and Other (See Comments)    PATIENT HAS HAD A PCN REACTION WITH IMMEDIATE RASH, FACIAL/TONGUE/THROAT SWELLING, SOB, OR LIGHTHEADEDNESS WITH HYPOTENSION:  **Can tolerate cephalosporins**    Time coordinating discharge: Over 30 minutes   SIGNED:   Harold Hedge, D.O. [facility] Pager: [phone]  07/02/2019, 2:04 PM

## 2019-07-03 ENCOUNTER — Ambulatory Visit (INDEPENDENT_AMBULATORY_CARE_PROVIDER_SITE_OTHER): Payer: Medicaid Other | Admitting: Podiatry

## 2019-07-03 ENCOUNTER — Ambulatory Visit: Payer: Medicaid Other | Admitting: Internal Medicine

## 2019-07-03 ENCOUNTER — Encounter: Payer: Self-pay | Admitting: Podiatry

## 2019-07-03 ENCOUNTER — Other Ambulatory Visit: Payer: Self-pay

## 2019-07-03 ENCOUNTER — Telehealth: Payer: Self-pay | Admitting: *Deleted

## 2019-07-03 ENCOUNTER — Ambulatory Visit: Payer: Medicaid Other | Admitting: Podiatry

## 2019-07-03 VITALS — Temp 97.6°F

## 2019-07-03 DIAGNOSIS — E08621 Diabetes mellitus due to underlying condition with foot ulcer: Secondary | ICD-10-CM | POA: Diagnosis not present

## 2019-07-03 DIAGNOSIS — L97422 Non-pressure chronic ulcer of left heel and midfoot with fat layer exposed: Secondary | ICD-10-CM

## 2019-07-03 NOTE — Telephone Encounter (Signed)
[name] w/ [facility] is requesting order clarification on the Wound Vac Change. She saw him today and changed but How often should she changing and since he is coming in of Friday should she change before visit. Please call [phone].

## 2019-07-03 NOTE — Telephone Encounter (Signed)
I spoke with [facility] - [name] and informed that beginning next week [facility] should do Monday and Wednesday wound vac care and Dr. March Rummage would perform on Fridays. [name] asked if the wound vac was placed at the hospital and I stated it was. [name] states she will contact [employer] for supplies and states at her visit today pt did not have the wound vac turned on. [name] states she told him it would only perform properly if cut on.

## 2019-07-05 ENCOUNTER — Ambulatory Visit (INDEPENDENT_AMBULATORY_CARE_PROVIDER_SITE_OTHER): Payer: Medicaid Other | Admitting: Podiatry

## 2019-07-05 ENCOUNTER — Other Ambulatory Visit: Payer: Self-pay

## 2019-07-05 DIAGNOSIS — E08621 Diabetes mellitus due to underlying condition with foot ulcer: Secondary | ICD-10-CM

## 2019-07-05 DIAGNOSIS — L97422 Non-pressure chronic ulcer of left heel and midfoot with fat layer exposed: Secondary | ICD-10-CM

## 2019-07-06 ENCOUNTER — Ambulatory Visit: Payer: Medicaid Other | Admitting: Podiatry

## 2019-07-09 ENCOUNTER — Telehealth: Payer: Self-pay | Admitting: Internal Medicine

## 2019-07-09 MED ORDER — AMLODIPINE BESYLATE 10 MG PO TABS: 10.0000 mg | ORAL_TABLET | Freq: Every day | ORAL | 3 refills | Status: DC

## 2019-07-09 NOTE — Telephone Encounter (Signed)
FYI

## 2019-07-09 NOTE — Telephone Encounter (Signed)
He should be on Amlodipine and Carvedilol Thanks,

## 2019-07-09 NOTE — Telephone Encounter (Signed)
   Patient states he is taking his medications daily as instructed. He does not have a BP monitor [facility] to return to see patient on Wednesday

## 2019-07-09 NOTE — Telephone Encounter (Signed)
New message:   Jackelyn Poling is calling with [facility] and states the pt's BP 180/108 HR 107 and states pt has not taken his medication

## 2019-07-09 NOTE — Telephone Encounter (Signed)
LM for pt to call back.

## 2019-07-10 NOTE — Telephone Encounter (Signed)
Pt.notified

## 2019-07-11 ENCOUNTER — Telehealth: Payer: Self-pay | Admitting: Internal Medicine

## 2019-07-11 ENCOUNTER — Telehealth: Payer: Self-pay

## 2019-07-11 NOTE — Telephone Encounter (Signed)
Spoke to nurse Olivia Mackie from [facility] and informed her that the parameters from pt last visit 02/2019 were anything persistently below 70 or anything persistently above 300 to contact office. Also asked if she would let pt know that he needs to reschedule appt with [place] that he missed on 07/03/2019 due to hospitalization.

## 2019-07-11 NOTE — Telephone Encounter (Signed)
Please advise, pt reports to be taking both BP medications as prescribed.

## 2019-07-11 NOTE — Telephone Encounter (Signed)
Nurse from [facility] called stating the patient has blood sugar of 284 and she wanted to let the Dr know. Nurse number is [phone].

## 2019-07-11 NOTE — Telephone Encounter (Signed)
New message   1. What are BP readings? 170/100 - taken in both arm   2. Are you having any other symptoms (ex. Dizziness, headache, blurred vision, passed out)? No   3. What is your BP issue?  Wanted to let the MD to know, it was elevated on Monday 4.12.21 as well.

## 2019-07-12 ENCOUNTER — Other Ambulatory Visit: Payer: Self-pay

## 2019-07-13 ENCOUNTER — Other Ambulatory Visit: Payer: Self-pay | Admitting: Internal Medicine

## 2019-07-13 ENCOUNTER — Ambulatory Visit: Payer: Medicaid Other | Admitting: Podiatry

## 2019-07-13 VITALS — Temp 96.6°F

## 2019-07-13 DIAGNOSIS — L97422 Non-pressure chronic ulcer of left heel and midfoot with fat layer exposed: Secondary | ICD-10-CM | POA: Diagnosis not present

## 2019-07-13 DIAGNOSIS — E08621 Diabetes mellitus due to underlying condition with foot ulcer: Secondary | ICD-10-CM

## 2019-07-13 MED ORDER — CLONIDINE HCL 0.1 MG PO TABS: 0.1000 mg | ORAL_TABLET | Freq: Two times a day (BID) | ORAL | 11 refills | Status: DC

## 2019-07-13 NOTE — Telephone Encounter (Signed)
I emailed Rx for Clonidine to [facility]  - take 1 bid Thx

## 2019-07-16 ENCOUNTER — Encounter: Payer: Self-pay | Admitting: Internal Medicine

## 2019-07-16 ENCOUNTER — Ambulatory Visit (INDEPENDENT_AMBULATORY_CARE_PROVIDER_SITE_OTHER): Payer: Medicaid Other | Admitting: Internal Medicine

## 2019-07-16 ENCOUNTER — Other Ambulatory Visit: Payer: Self-pay

## 2019-07-16 VITALS — BP 132/84 | HR 110 | Temp 98.6°F | Ht 64.0 in | Wt 197.8 lb

## 2019-07-16 DIAGNOSIS — E1121 Type 2 diabetes mellitus with diabetic nephropathy: Secondary | ICD-10-CM | POA: Diagnosis not present

## 2019-07-16 DIAGNOSIS — Z794 Long term (current) use of insulin: Secondary | ICD-10-CM | POA: Insufficient documentation

## 2019-07-16 DIAGNOSIS — N1831 Chronic kidney disease, stage 3a: Secondary | ICD-10-CM

## 2019-07-16 DIAGNOSIS — E1142 Type 2 diabetes mellitus with diabetic polyneuropathy: Secondary | ICD-10-CM | POA: Diagnosis not present

## 2019-07-16 DIAGNOSIS — E1122 Type 2 diabetes mellitus with diabetic chronic kidney disease: Secondary | ICD-10-CM | POA: Insufficient documentation

## 2019-07-16 DIAGNOSIS — E113593 Type 2 diabetes mellitus with proliferative diabetic retinopathy without macular edema, bilateral: Secondary | ICD-10-CM

## 2019-07-16 LAB — GLUCOSE, POCT (MANUAL RESULT ENTRY): POC Glucose: 189 mg/dl — AB (ref 70–99)

## 2019-07-16 NOTE — Telephone Encounter (Signed)
Pt.notified

## 2019-07-16 NOTE — Patient Instructions (Signed)
-   Continue Metformin 500 mg TWO tablet daily  - Continue Lantus 15 units daily   - Avoid sugar -sweetened beverages, may try flavored sparking water such as [place]       HOW TO TREAT LOW BLOOD SUGARS (Blood sugar LESS THAN 70 MG/DL)  Please follow the RULE OF 15 for the treatment of hypoglycemia treatment (when your (blood sugars are less than 70 mg/dL)    STEP 1: Take 15 grams of carbohydrates when your blood sugar is low, which includes:   3-4 GLUCOSE TABS  OR  3-4 OZ OF JUICE OR REGULAR SODA OR  ONE TUBE OF GLUCOSE GEL     STEP 2: RECHECK blood sugar in 15 MINUTES STEP 3: If your blood sugar is still low at the 15 minute recheck --> then, go back to STEP 1 and treat AGAIN with another 15 grams of carbohydrates.

## 2019-07-16 NOTE — Progress Notes (Signed)
Name: [name]  Age/ Sex: 49 y.o., male   MRN/ DOB: [id number], January 07, 1971     PCP: Cassandria Anger, MD   Reason for Endocrinology Evaluation: Type 2 Diabetes Mellitus     Initial Endocrinology Clinic Visit: 07/14/2018    PATIENT IDENTIFIER: [name] is a 49 y.o. male with a past medical history of T2DM, and HTN, Hx toe amputations Bilaterally  The patient has followed with Endocrinology clinic since 07/14/2018 for consultative assistance with management of his diabetes.  DIABETIC HISTORY:  [name] was diagnosed with T2DM over 20 years ago. He has been on Metformin since diagnosis, he also has been on glipizide and prandin but never took them. He was on insulin until sometime in 2019. His hemoglobin A1c has ranged from 7.0% in 2014, peaking at 11.3% in 2019.   Pt could not afford prandin nor glipizide and was switched to basal insulin in 02/2019   SUBJECTIVE:   During the last visit (03/27/2019): Continued Metformin , stopped prandin and started Basal insulin.    Today (07/16/2019): [name] is here for a 3 month  follow up on diabetes management.  He checks his blood sugars 2 times a week . Pt did not being his meter today. The patient has not had hypoglycemic episodes since being home but he did have a hypoglycemic episodes during hospitalization S/P left foot ulcer debridement 06/29/2019  Eats 2 meals a day , snacks regularly    ROS: As per HPI and as detailed below: Review of Systems  Constitutional: Positive for fever.  Gastrointestinal: Positive for constipation. Negative for diarrhea and nausea.  Genitourinary: Negative for frequency.  Endo/Heme/Allergies: Negative for polydipsia.      HOME DIABETES REGIMEN:  Metformin 500 mg XR 2 tab daily  Lantus 15 units daily - skips it 2 days a week    GLUCOSE LOG : Did not bring      HISTORY:  Past Medical History:  Past Medical History:  Diagnosis Date  . Diabetes mellitus type II,  uncontrolled ([place])   . Hyperlipidemia   . Hypertension   . Other and unspecified angina pectoris   . Tobacco use    Past Surgical History:  Past Surgical History:  Procedure Laterality Date  . AMPUTATION Left 08/22/2016   Procedure: GREAT TOE AMPUTATION;  Surgeon: [name], MD;  Location: [place];  Service: Orthopedics;  Laterality: Left;  . AMPUTATION Right 05/28/2017   Procedure: AMPUTATION 1st & 3rd TOE;  Surgeon: [name], MD;  Location: [facility];  Service: Orthopedics;  Laterality: Right;  . EYE SURGERY    . IRRIGATION AND DEBRIDEMENT FOOT Left 06/29/2019   Procedure: IRRIGATION AND DEBRIDEMENT FOOT application wound vac;  Surgeon: [name], DPM;  Location: [facility];  Service: Podiatry;  Laterality: Left;  . LEFT HEART CATHETERIZATION WITH CORONARY ANGIOGRAM N/A 07/31/2013   Procedure: LEFT HEART CATHETERIZATION WITH CORONARY ANGIOGRAM;  Surgeon: Jettie Booze, MD;  Location[facility] C[facility];  Service: Cardiovascular;  Laterality: N/A;  . spinal tap    . toe amputated      Social History:  reports that he has been smoking cigarettes. He has a 20.00 pack-year smoking history. He has never used smokeless tobacco. He reports previous alcohol use. He reports that he does not use drugs. Family History:  Family History  Problem Relation Age of Onset  . Cancer Mother   . Diabetes Father   . Hypertension Father  HOME MEDICATIONS: Allergies as of 07/16/2019      Reactions   Bee Venom Swelling   SWELLING REACTION UNSPECIFIED    Penicillins Hives, Other (See Comments)   PATIENT HAS HAD A PCN REACTION WITH IMMEDIATE RASH, FACIAL/TONGUE/THROAT SWELLING, SOB, OR LIGHTHEADEDNESS WITH HYPOTENSION:  **Can tolerate cephalosporins**      Medication List       Accurate as of July 16, 2019  3:17 PM. If you have any questions, ask your nurse or doctor.        amLODipine 10 MG tablet Commonly known as: NORVASC Take 1 tablet (10 mg total) by mouth daily.   aspirin EC 81  MG tablet Take 81 mg by mouth daily.   atorvastatin 20 MG tablet Commonly known as: LIPITOR Take 1 tablet (20 mg total) by mouth daily.   b complex vitamins tablet Take 1 tablet by mouth daily.   blood glucose meter kit and supplies Kit Dispense based on patient and insurance preference. Use up to four times daily as directed. (FOR ICD-9 250.00, 250.01).   carvedilol 25 MG tablet Commonly known as: Coreg Take 1 tablet (25 mg total) by mouth 2 (two) times daily.   cloNIDine 0.1 MG tablet Commonly known as: CATAPRES Take 1 tablet (0.1 mg total) by mouth 2 (two) times daily.   escitalopram 10 MG tablet Commonly known as: Lexapro Take 1 tablet (10 mg total) by mouth at bedtime.   ferrous sulfate 325 (65 FE) MG tablet Take 1 tablet (325 mg total) by mouth daily.   gabapentin 300 MG capsule Commonly known as: Neurontin Take 1 capsule (300 mg total) by mouth 3 (three) times daily. Patient needs office visit before refills will be given   Insulin Pen Needle 32G X 4 MM Misc 1 Device by Does not apply route daily.   Insulin Pen Needle 32G X 4 MM Misc 30 each by Does not apply route daily.   Insulin Pen Needle 32G X 4 MM Misc 1 each by Does not apply route daily.   Lantus SoloStar 100 UNIT/ML Solostar Pen Generic drug: insulin glargine Inject 15 Units into the skin daily.   metFORMIN 500 MG tablet Commonly known as: GLUCOPHAGE Take 1 tablet (500 mg total) by mouth 2 (two) times daily with a meal.   mupirocin ointment 2 % Commonly known as: Bactroban Apply 1 application topically 2 (two) times daily.   Vitamin D3 50 MCG (2000 UT) capsule Take 1 capsule (2,000 Units total) by mouth daily.   Vitamin D3 1.25 MG (50000 UT) Caps Take 1 capsule by mouth once a week.      OBJECTIVE:    PHYSICAL EXAM: VS: BP 132/84 (BP Location: Left Arm, Patient Position: Sitting, Cuff Size: Large)   Pulse (!) 110   Temp 98.6 F (37 C)   Ht 5' 4"  (1.626 m)   Wt 197 lb 12.8 oz (89.7  kg)   SpO2 98%   BMI 33.95 kg/m    EXAM: General: Pt appears well and is in NAD  Lungs: Clear with good BS bilat with no rales, rhonchi, or wheezes  Heart: Auscultation: RRR with normal S1 and S2  Extremities: BL LE: no pretibial edema on the right, left boot in place  Mental Status: Judgment, insight: intact Orientation: oriented to time, place, and person Mood and affect: no depression, anxiety, or agitation    DATA REVIEWED:  Lab Results  Component Value Date   HGBA1C 8.8 (H) 06/29/2019   HGBA1C 9.0 (H) 06/28/2019  HGBA1C 12.4 (H) 01/24/2019   Lab Results  Component Value Date   MICROALBUR 92.0 (H) 06/01/2017   LDLCALC 58 10/17/2018   CREATININE 2.00 (H) 07/02/2019   Lab Results  Component Value Date   MICRALBCREAT 50.3 (H) 06/01/2017     Lab Results  Component Value Date   CHOL 126 10/17/2018   HDL 49.90 10/17/2018   LDLCALC 58 10/17/2018   TRIG 90.0 10/17/2018   CHOLHDL 3 10/17/2018         ASSESSMENT / PLAN / RECOMMENDATIONS:   1) Type 2 Diabetes Mellitus, Poorly controlled, With Neuropathic complications, retinopathy , CKD III and B/L toe amputations - Most recent A1c of 8.8 %. Goal A1c <  7.0 %.   - A1c has slowly improved, he admits to continued dietary indiscretions, and at times he would forget to take his medications.  - I went over with the patient various strategies as to how he can be reminded to take his medications - GFR low which limites oral glycemic agent choices.  - His post-prandial today is 189 mg/dL which is acceptable  - We again discussed avoiding sugar-sweetened beverages, will refer him to our CDE    MEDICATIONS:  Continue Metformin 500 mg XR 2 tabs daily   Continue Lantus 15 units daily    EDUCATION / INSTRUCTIONS:  BG monitoring instructions: Patient is instructed to check his blood sugars 2 times a day, fasting and bedtime.  Call [facility] if: BG persistently < 70 or > 300. . I reviewed the Rule of  15 for the treatment of hypoglycemia in detail with the patient. Literature supplied.     F/U in 3 months    Signed electronically by: Mack Guise, MD  [facility] Endocrinology  [facility] [address] [place], [place] [address] Phone: [phone] FAX: [phone]   CC: Cassandria Anger, MD [place] [address] Phone: [phone]  Fax: [phone]  Return to Endocrinology clinic as below: Future Appointments  Date Time Provider [place]  07/19/2019 10:40 AM Plotnikov, Evie Lacks, MD [facility] None  07/20/2019  2:15 PM [name], DPM [facility]  10/19/2019  8:50 AM Elanor Cale, Melanie Crazier, MD [facility] None

## 2019-07-18 ENCOUNTER — Telehealth: Payer: Self-pay | Admitting: Internal Medicine

## 2019-07-18 ENCOUNTER — Other Ambulatory Visit: Payer: Self-pay

## 2019-07-18 MED ORDER — ACCU-CHEK SOFTCLIX LANCETS MISC: 12 refills | Status: DC

## 2019-07-18 MED ORDER — GLUCOSE BLOOD VI STRP: ORAL_STRIP | 12 refills | Status: DC

## 2019-07-18 NOTE — Telephone Encounter (Signed)
Clarification given to [name], pt is taking both meds

## 2019-07-18 NOTE — Telephone Encounter (Signed)
   Traci from [facility] calling to verify if  patient should be taking both Carvedilol and Clonidine  Phone [phone]

## 2019-07-18 NOTE — Telephone Encounter (Signed)
done

## 2019-07-18 NOTE — Telephone Encounter (Signed)
Medication Refill Request  Did you call your pharmacy and request this refill first? No - hasn't used this pharmacy for this RX  . If patient has not contacted pharmacy first, instruct them to do so for future refills.  . Remind them that contacting the pharmacy for their refill is the quickest method to get the refill.  . Refill policy also stated that it will take anywhere between 24-72 hours to receive the refill.    Name of medication? Lancets / test strips (Accu chek glucometer)  Is this a 90 day supply? Patient unsure  Name and location of pharmacy?  [facility] ([address] [place] - [address] Phone:  [phone]  Fax:  [phone]       . Is the request for diabetes test strips? yes . If yes, what brand? accu chek glucometer

## 2019-07-19 ENCOUNTER — Encounter: Payer: Self-pay | Admitting: Internal Medicine

## 2019-07-19 ENCOUNTER — Ambulatory Visit (INDEPENDENT_AMBULATORY_CARE_PROVIDER_SITE_OTHER): Payer: Medicaid Other | Admitting: Internal Medicine

## 2019-07-19 ENCOUNTER — Other Ambulatory Visit: Payer: Self-pay

## 2019-07-19 DIAGNOSIS — L089 Local infection of the skin and subcutaneous tissue, unspecified: Secondary | ICD-10-CM | POA: Diagnosis not present

## 2019-07-19 DIAGNOSIS — E11628 Type 2 diabetes mellitus with other skin complications: Secondary | ICD-10-CM

## 2019-07-19 DIAGNOSIS — E559 Vitamin D deficiency, unspecified: Secondary | ICD-10-CM | POA: Diagnosis not present

## 2019-07-19 DIAGNOSIS — E538 Deficiency of other specified B group vitamins: Secondary | ICD-10-CM | POA: Diagnosis not present

## 2019-07-19 MED ORDER — SENNOSIDES-DOCUSATE SODIUM 8.6-50 MG PO TABS: 2.0000 | ORAL_TABLET | Freq: Every day | ORAL | 6 refills | Status: DC

## 2019-07-19 NOTE — Assessment & Plan Note (Addendum)
F/u w/[name] Vacuum device PO abx Better per pt

## 2019-07-19 NOTE — Assessment & Plan Note (Signed)
On B12 

## 2019-07-19 NOTE — Progress Notes (Signed)
Subjective:  Patient ID: [name], male    DOB: Mar 13, 1971  Age: 49 y.o. MRN: [id number]  CC: No chief complaint on file.   HPI [name] presents for foot infection, HTN, DM f/u [facility] 97-115 C/o constipation [name] has to change doctors due to [facility] Access  Outpatient Medications Prior to Visit  Medication Sig Dispense Refill  . Accu-Chek Softclix Lancets lancets Use as instructed to test blood sugar 2 times daily E11.65 100 each 12  . amLODipine (NORVASC) 10 MG tablet Take 1 tablet (10 mg total) by mouth daily. 90 tablet 3  . aspirin EC 81 MG tablet Take 81 mg by mouth daily.    Marland Kitchen b complex vitamins tablet Take 1 tablet by mouth daily. 100 tablet 3  . blood glucose meter kit and supplies KIT Dispense based on patient and insurance preference. Use up to four times daily as directed. (FOR ICD-9 250.00, 250.01). 1 each 0  . carvedilol (COREG) 25 MG tablet Take 1 tablet (25 mg total) by mouth 2 (two) times daily. 60 tablet 11  . Cholecalciferol (VITAMIN D3) 1.25 MG (50000 UT) CAPS Take 1 capsule by mouth once a week. 12 capsule 0  . Cholecalciferol (VITAMIN D3) 50 MCG (2000 UT) capsule Take 1 capsule (2,000 Units total) by mouth daily. 100 capsule 3  . cloNIDine (CATAPRES) 0.1 MG tablet Take 1 tablet (0.1 mg total) by mouth 2 (two) times daily. 60 tablet 11  . escitalopram (LEXAPRO) 10 MG tablet Take 1 tablet (10 mg total) by mouth at bedtime. 90 tablet 1  . ferrous sulfate 325 (65 FE) MG tablet Take 1 tablet (325 mg total) by mouth daily. 90 tablet 1  . gabapentin (NEURONTIN) 300 MG capsule Take 1 capsule (300 mg total) by mouth 3 (three) times daily. Patient needs office visit before refills will be given 270 capsule 3  . glucose blood test strip Use as instructed to test blood sugar 2 times daily E11.65 100 each 12  . insulin glargine (LANTUS SOLOSTAR) 100 UNIT/ML Solostar Pen Inject 15 Units into the skin daily. 15 mL 1  . Insulin Pen Needle 32G X 4 MM MISC 1  Device by Does not apply route daily. 100 each 3  . Insulin Pen Needle 32G X 4 MM MISC 30 each by Does not apply route daily. 30 each 1  . Insulin Pen Needle 32G X 4 MM MISC 1 each by Does not apply route daily. 30 each 1  . metFORMIN (GLUCOPHAGE) 500 MG tablet Take 1 tablet (500 mg total) by mouth 2 (two) times daily with a meal. 180 tablet 3  . mupirocin ointment (BACTROBAN) 2 % Apply 1 application topically 2 (two) times daily. 22 g 6  . atorvastatin (LIPITOR) 20 MG tablet Take 1 tablet (20 mg total) by mouth daily. 90 tablet 3   No facility-administered medications prior to visit.    ROS: Review of Systems  Objective:  BP (!) 166/92 (BP Location: Left Arm, Patient Position: Sitting, Cuff Size: Large)   Pulse 93   Temp 97.9 F (36.6 C) (Oral)   Ht 5' 4" (1.626 m)   Wt 201 lb (91.2 kg)   SpO2 99%   BMI 34.50 kg/m   BP Readings from Last 3 Encounters:  07/19/19 (!) 166/92  07/16/19 132/84  07/02/19 (!) 143/83    Wt Readings from Last 3 Encounters:  07/19/19 201 lb (91.2 kg)  07/16/19 197 lb 12.8 oz (89.7 kg)  06/29/19  195 lb 15.8 oz (88.9 kg)    Physical Exam  Lab Results  Component Value Date   WBC 12.4 (H) 07/01/2019   HGB 9.2 (L) 07/01/2019   HCT 30.2 (L) 07/01/2019   PLT 301 07/01/2019   GLUCOSE 92 07/02/2019   CHOL 126 10/17/2018   TRIG 90.0 10/17/2018   HDL 49.90 10/17/2018   LDLCALC 58 10/17/2018   ALT 13 06/30/2019   AST 9 (L) 06/30/2019   NA 138 07/02/2019   K 4.2 07/02/2019   CL 112 (H) 07/02/2019   CREATININE 2.00 (H) 07/02/2019   BUN 27 (H) 07/02/2019   CO2 22 07/02/2019   TSH 1.770 06/29/2019   PSA 0.75 10/17/2018   INR 1.1 06/30/2019   HGBA1C 8.8 (H) 06/29/2019   MICROALBUR 92.0 (H) 06/01/2017    MR FOOT LEFT W WO CONTRAST  Result Date: 06/29/2019 CLINICAL DATA:  Foot pain and swelling. EXAM: MRI OF THE LEFT FOREFOOT WITHOUT AND WITH CONTRAST TECHNIQUE: Multiplanar, multisequence MR imaging of the left foot was performed both before and  after administration of intravenous contrast. CONTRAST:  63m GADAVIST GADOBUTROL 1 MMOL/ML IV SOLN COMPARISON:  MRI 05/21/2018 and radiographs 06/28/2019 FINDINGS: Surgical changes from prior right great toe amputation. Chronic deformity of the second proximal phalanx. I do not see any findings suspicious for septic arthritis or osteomyelitis. 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 There appears to be a large open wound on the plantar aspect of the midfoot with possible surrounding blister formation. I do not see a discrete drainable soft tissue abscess or significant cellulitis or myofasciitis. IMPRESSION: 1. Large open wound on the plantar aspect of the midfoot with possible surrounding blister formation. No discrete drainable soft tissue abscess or significant cellulitis. 2. No findings suspicious for septic arthritis or osteomyelitis. 3. 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 Electronically Signed   By: PMarijo SanesM.D.   On: 06/29/2019 18:39    Assessment & Plan:   There are no diagnoses linked to this encounter.   No orders of the defined types were placed in this encounter.    Follow-up: No follow-ups on file.  AWalker Kehr MD

## 2019-07-19 NOTE — Assessment & Plan Note (Signed)
Vit D 

## 2019-07-20 ENCOUNTER — Ambulatory Visit: Payer: Medicaid Other | Admitting: Podiatry

## 2019-07-20 DIAGNOSIS — L97522 Non-pressure chronic ulcer of other part of left foot with fat layer exposed: Secondary | ICD-10-CM | POA: Diagnosis not present

## 2019-07-20 DIAGNOSIS — L97511 Non-pressure chronic ulcer of other part of right foot limited to breakdown of skin: Secondary | ICD-10-CM

## 2019-07-20 NOTE — Progress Notes (Signed)
  Subjective:  Patient ID: [name], male    DOB: 1970/05/24,  MRN: [id number]  Chief Complaint  Patient presents with  . Wound Check    Pt denies fever/chills/nausea/vomiting and states he has no concerns.    49 y.o. male presents for wound care. Hx confirmed with patient.  Objective:  Physical Exam: Wound Location: left midfoot Wound Measurement: 1.5x1 Wound Base: Granular/Healthy Peri-wound: Macerated Exudate: Scant/small amount Serosanguinous exudate wound without warmth, erythema, signs of acute infection  Superficial abrasion right dorsal foot measuring 3x1    Assessment:   1. Ulcer of left foot with fat layer exposed ([place])   2. Ulcerated, foot, right, limited to breakdown of skin [facility])    Plan:  Patient was evaluated and treated and all questions answered.  Ulcer left foot -Continues to improve. Almost fully healed -Discussed once the wound heals he will likely benefit from [place] fabrication given propensity of the area to ulcerate 2/2 midfoot charcot changes. -Wound VAC reapplied  Procedure: Wound VAC Application Location: left midfoot Wound Measurement: 1.5 cm x 1 cm x 0.3 cm  Technique: Black foam to wound base, followed by adherent dressing. Set to 125 mmHg with good seal noted. Disposition: Patient tolerated procedure well.   Return in about 1 week (around 07/27/2019).

## 2019-07-25 ENCOUNTER — Other Ambulatory Visit: Payer: Self-pay

## 2019-07-25 MED ORDER — ACCU-CHEK FASTCLIX LANCETS MISC: 1.0000 | 12 refills | Status: DC

## 2019-07-26 ENCOUNTER — Other Ambulatory Visit: Payer: Self-pay

## 2019-07-26 ENCOUNTER — Ambulatory Visit (INDEPENDENT_AMBULATORY_CARE_PROVIDER_SITE_OTHER): Payer: Medicaid Other | Admitting: Podiatry

## 2019-07-26 DIAGNOSIS — L97522 Non-pressure chronic ulcer of other part of left foot with fat layer exposed: Secondary | ICD-10-CM | POA: Diagnosis not present

## 2019-08-01 ENCOUNTER — Telehealth: Payer: Self-pay | Admitting: *Deleted

## 2019-08-01 NOTE — Telephone Encounter (Signed)
I spoke with [facility] - [name] states she removed the wound vac and applied a wet-to-dry dressing. I told her that would fine.

## 2019-08-01 NOTE — Telephone Encounter (Signed)
[facility] - [name] states pt's wound vac doesn't need changing, wound is partially open and macerated, would like to do wet to dry dressing until appt tomorrow's appt.

## 2019-08-02 ENCOUNTER — Ambulatory Visit: Payer: Medicaid Other | Admitting: Podiatry

## 2019-08-02 ENCOUNTER — Other Ambulatory Visit: Payer: Self-pay

## 2019-08-02 DIAGNOSIS — L97522 Non-pressure chronic ulcer of other part of left foot with fat layer exposed: Secondary | ICD-10-CM

## 2019-08-02 NOTE — Telephone Encounter (Signed)
That's fine

## 2019-08-03 ENCOUNTER — Telehealth: Payer: Self-pay | Admitting: *Deleted

## 2019-08-03 MED ORDER — SILVER SULFADIAZINE 1 % EX CREA: 1.0000 "application " | TOPICAL_CREAM | Freq: Every day | CUTANEOUS | 1 refills | Status: DC

## 2019-08-03 NOTE — Telephone Encounter (Signed)
Pt called states the wound vac was removed and he would like to know how to dress the foot.

## 2019-08-03 NOTE — Telephone Encounter (Signed)
Dr. March Rummage ordered pt to apply silvadene dressing daily. I informed pt and sent orders for silvadene to the [facility].

## 2019-08-06 NOTE — Telephone Encounter (Signed)
Debbie - [facility] states the wound vac is to be removed and she needed wound care dressing instructions.

## 2019-08-06 NOTE — Telephone Encounter (Signed)
I informed [name] - [facility] of Dr. March Rummage orders for Daily silvadene cream and dry sterile dressing. [name] states they can go out 3 times a week and will teach pt or pt's caregiver dressing changes for the remaining 4 days.

## 2019-08-06 NOTE — Telephone Encounter (Signed)
Unable to leave wound care instructions for [facility] - Debbie, her voicemail box is full.

## 2019-08-09 ENCOUNTER — Ambulatory Visit: Payer: Medicaid Other | Admitting: Podiatry

## 2019-08-16 ENCOUNTER — Ambulatory Visit (INDEPENDENT_AMBULATORY_CARE_PROVIDER_SITE_OTHER): Payer: Medicaid Other | Admitting: Podiatry

## 2019-08-16 ENCOUNTER — Other Ambulatory Visit: Payer: Self-pay

## 2019-08-16 DIAGNOSIS — L97522 Non-pressure chronic ulcer of other part of left foot with fat layer exposed: Secondary | ICD-10-CM | POA: Diagnosis not present

## 2019-08-20 ENCOUNTER — Telehealth: Payer: Self-pay | Admitting: *Deleted

## 2019-08-20 NOTE — Telephone Encounter (Signed)
Pt called back and left message with questions.  I returned call and pt states medicaid gave him numbers for medical supply companies. I gave him [employer] number and [employer] number to see if they could help pt.

## 2019-08-20 NOTE — Telephone Encounter (Signed)
Pt states Dr. March Rummage gave rx for diabetic shoes and [facility] states they no longer sell diabetic shoes.

## 2019-08-22 ENCOUNTER — Telehealth: Payer: Self-pay | Admitting: Podiatry

## 2019-08-22 NOTE — Telephone Encounter (Signed)
Pt called me back and left message originally stating [employer] told him they did the diabetic shoes and he would need a rx faxed to them..  I returned pts call and he said he spoke to [employer] again and they do not do diabetic shoes. He has recently changed medicaid plans but has not gotten a confirmation yet and I told him to once he gets that we can see if they will cover it in our office.

## 2019-08-30 ENCOUNTER — Other Ambulatory Visit: Payer: Self-pay

## 2019-08-30 ENCOUNTER — Ambulatory Visit: Payer: Medicaid Other | Admitting: Podiatry

## 2019-08-30 VITALS — Temp 97.5°F

## 2019-08-30 DIAGNOSIS — L97522 Non-pressure chronic ulcer of other part of left foot with fat layer exposed: Secondary | ICD-10-CM

## 2019-08-30 DIAGNOSIS — L97511 Non-pressure chronic ulcer of other part of right foot limited to breakdown of skin: Secondary | ICD-10-CM

## 2019-09-04 ENCOUNTER — Ambulatory Visit: Payer: Medicaid Other | Admitting: Nutrition

## 2019-09-05 ENCOUNTER — Ambulatory Visit: Payer: Medicaid Other | Admitting: Family Medicine

## 2019-09-10 NOTE — Progress Notes (Signed)
  Subjective:  Patient ID: [name], male    DOB: 02-22-1971,  MRN: [id number]  Chief Complaint  Patient presents with  . Wound Check    L midfoot. Pt stated, "It's doing well. The wound vac hasn't been draining anything. 4/10 pain. No pus/foul odor/fever/chills/N&V/abnormal glucose".    49 y.o. male presents for wound care. Hx confirmed with patient.  Objective:  Physical Exam: Wound Location: left midfoot Wound Measurement: 5x3 Wound Base: Granular/Healthy Peri-wound: Calloused Exudate: Moderate amount Serosanguinous exudate wound without warmth, erythema, signs of acute infection  Assessment:   1. Diabetic ulcer of left midfoot associated with diabetes mellitus due to underlying condition, with fat layer exposed ([place])      Plan:  Patient was evaluated and treated and all questions answered.  Ulcer left midfoot -Improving, no signs of infection -Offload ulcer with surgical shoe -Wound cleansed and debrided -Continue VAC with HHC  Procedure: Wound VAC Application Location: left midfoot Wound Measurement: 5 cm x 3 cm x 0.3 cm  Technique: Black foam to wound base, followed by adherent dressing. Set to 125 mmHg with good seal noted. Disposition: Patient tolerated procedure well.  No follow-ups on file.

## 2019-09-12 ENCOUNTER — Other Ambulatory Visit: Payer: Self-pay

## 2019-09-12 ENCOUNTER — Encounter: Payer: Medicaid Other | Attending: Internal Medicine | Admitting: Nutrition

## 2019-09-12 DIAGNOSIS — E1165 Type 2 diabetes mellitus with hyperglycemia: Secondary | ICD-10-CM

## 2019-09-16 NOTE — Progress Notes (Signed)
Patient was identified by name and DOB.  He is here today "to learn about how to control my diabetes".  Motivation for the above is "9" (out of 10). Discussed his complications from diabetes, lack of circulation to extremities, kidney failure, eye disease, and the need to get this diabetes under good control! He agreed and says he is eager to "do what it takes". SBGM;  Did not bring meter.  Says he test his blood sugar 1-2 hours after breakfast, and it is ususally between 120-160.   Diabetes Medications:  Lantus 15u-bedtime-- "forgets some times, maybe 2 or 3 times a week"   Metformin: 1 tablet q AM. Exercise:  None due to difficulty walking Wants to loose 40-50 pounds Typical day: 3M water to take his medciations 12PM: 1 hot dog with cheese and chilly with french fries, or 1 egg cooked in margarine,with cheese and 2-3 pieces of bacon, 2 slices of bread, sweet tea, or water. 3-4 PM: chips- up to 1/2 a large bag, sweat tea, or water, sometimes fruit coctalin,  Or canned peaches or pairs in sweet syrup, watermellon, brownies, or cookies and icecream.   8PM: Lantus 15u.   10PM: bed--does not sleep, just lays there and doses off until 2AM.    Says sometimes he goes for 3 days and does not eat anything--he has no appetitie.  Suggestions given . 1.  Stop all sweet drinks today!.   2.  Test blood sugar before breakfast once a day, and at bedtime the next day, alternating. 3.  Note on refrigerator to take Lantus insulin. Discussed the importance of the insulin, in bringing down the blood sugar from all the snacking he does in the afternoon and evening.   4.  Choose lower fat choices for snacking:  Handout given for this. 5.  Switch to canned fruit in its own juice,  6.  Do chair exercises given --starting out doing 10 min. Twice daily and working up to 15 min. Twice daily.   7.  Discussed the idea tht high fat foods like fries, cheese, bacon and things cooked in lots of oil will raise blood sugar.   Handout given for other breakfasts chioces.   Return 1 month.

## 2019-09-16 NOTE — Patient Instructions (Signed)
1.  Stop all sweet drinks today!.   2.  Test blood sugar before breakfast once a day, and at bedtime the next day, alternating. 3.  Note on refrigerator to take Lantus insulin. Discussed the importance of the insulin, in bringing down the blood sugar from all the snacking he does in the afternoon and evening.   4.  Choose lower fat choices for snacking from handout given 5.  Switch to canned fruit in its own juice,  6.  Do chair exercises given --starting out doing 10 min. Twice daily and working up to 15 min. Twice daily.   7.  Choose from list of other breakfasts chioces on handout given 8. Return 1 month.

## 2019-09-24 NOTE — Progress Notes (Signed)
  Subjective:  Patient ID: [name], male    DOB: Oct 21, 1970,  MRN: [id number]  Chief Complaint  Patient presents with  . Wound Check    Pt states no concerns and denies fever/chills/nausea/vomiting.   49 y.o. male presents for postop care.  Denies postop issues.  Has been using the wound VAC as directed with home health care assistance  Objective:  Physical Exam: Wound is fully granular with small central skin bridge no warmth no erythema no surrounding signs of infection  2.5x1, 2x4      Assessment:   1. 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  Post-operative State -Wound cleansed -Wound VAC reapplied  Procedure: Wound VAC Application Location: left midfoot Wound Measurement: 2.5x1, 2x4 Technique: Black foam to wound base, followed by adherent dressing. Set to 125 mmHg with good seal noted. Disposition: Patient tolerated procedure well.    No follow-ups on file.

## 2019-09-29 NOTE — Progress Notes (Addendum)
  Subjective:  Patient ID: [name], male    DOB: [birth year],  MRN: [id number]  Chief Complaint  Patient presents with  . Wound Check    pt is here for a wound check of the left foot pt states that he is doing a lot better, pt states that the left foot is painful to the touch. pt also states that he has not been wearing his boot like he should be    49 y.o. male presents for wound care. Hx confirmed with patient.  Objective:  Physical Exam: Wound Location: left midfoot Wound Measurement: 0.3x0.2 Wound Base: Granular/Healthy Peri-wound: Macerated Exudate: Scant/small amount Serosanguinous exudate wound without warmth, erythema, signs of acute infection  Assessment:   1. Ulcer of left foot with fat layer exposed ([place])    Plan:  Patient was evaluated and treated and all questions answered.  Ulcer left foot -Discussed the importance of compliance with weightbearing precautions -No further need for VAC therapy -Dressed with Silvadene and DSD  No follow-ups on file.

## 2019-10-04 ENCOUNTER — Ambulatory Visit (INDEPENDENT_AMBULATORY_CARE_PROVIDER_SITE_OTHER): Payer: Medicaid Other | Admitting: Podiatry

## 2019-10-04 DIAGNOSIS — Z5329 Procedure and treatment not carried out because of patient's decision for other reasons: Secondary | ICD-10-CM

## 2019-10-04 NOTE — Progress Notes (Signed)
   Complete physical exam  Patient: [name]   DOB: 01/16/2000   49 y.o. Male  MRN: [id number]  Subjective:    No chief complaint on file.   [name] is a 49 y.o. male who presents today for a complete physical exam. She reports consuming a {diet types:17450} diet. {types:19826} She generally feels {DESC; WELL/FAIRLY WELL/POORLY:18703}. She reports sleeping {DESC; WELL/FAIRLY WELL/POORLY:18703}. She {does/does not:200015} have additional problems to discuss today.    Most recent fall risk assessment:    09/23/2021   10:42 AM  Fall Risk   Falls in the past year? 0  Number falls in past yr: 0  Injury with Fall? 0  Risk for fall due to : No Fall Risks  Follow up Falls evaluation completed     Most recent depression screenings:    09/23/2021   10:42 AM 08/14/2020   10:46 AM  PHQ 2/9 Scores  PHQ - 2 Score 0 0  PHQ- 9 Score 5     {VISON DENTAL STD PSA (Optional):27386}  {History (Optional):23778}  Patient Care Team: Jessup, Joy, NP as PCP - General (Nurse Practitioner)   Outpatient Medications Prior to Visit  Medication Sig   fluticasone (FLONASE) 50 MCG/ACT nasal spray Place 2 sprays into both nostrils in the morning and at bedtime. After 7 days, reduce to once daily.   norgestimate-ethinyl estradiol (SPRINTEC 28) 0.25-35 MG-MCG tablet Take 1 tablet by mouth daily.   Nystatin POWD Apply liberally to affected area 2 times per day   spironolactone (ALDACTONE) 100 MG tablet Take 1 tablet (100 mg total) by mouth daily.   No facility-administered medications prior to visit.    ROS        Objective:     There were no vitals taken for this visit. {Vitals History (Optional):23777}  Physical Exam   No results found for any visits on 10/29/21. {Show previous labs (optional):23779}    Assessment & Plan:    Routine Health Maintenance and Physical Exam  Immunization History  Administered Date(s) Administered   DTaP 04/01/1999, 05/28/1999,  08/06/1999, 04/21/2000, 11/05/2003   Hepatitis A 09/01/2007, 09/06/2008   Hepatitis B 01/17/1999, 02/24/1999, 08/06/1999   HiB (PRP-OMP) 04/01/1999, 05/28/1999, 08/06/1999, 04/21/2000   IPV 04/01/1999, 05/28/1999, 01/25/2000, 11/05/2003   Influenza,inj,Quad PF,6+ Mos 12/07/2013   Influenza-Unspecified 03/08/2012   MMR 01/24/2001, 11/05/2003   Meningococcal Polysaccharide 09/06/2011   Pneumococcal Conjugate-13 04/21/2000   Pneumococcal-Unspecified 08/06/1999, 10/20/1999   Tdap 09/06/2011   Varicella 01/25/2000, 09/01/2007    Health Maintenance  Topic Date Due   HIV Screening  Never done   Hepatitis C Screening  Never done   INFLUENZA VACCINE  10/27/2021   PAP-Cervical Cytology Screening  10/29/2021 (Originally 01/16/2020)   PAP SMEAR-Modifier  10/29/2021 (Originally 01/16/2020)   TETANUS/TDAP  10/29/2021 (Originally 09/05/2021)   HPV VACCINES  Discontinued   COVID-19 Vaccine  Discontinued    Discussed health benefits of physical activity, and encouraged her to engage in regular exercise appropriate for her age and condition.  Problem List Items Addressed This Visit   None Visit Diagnoses     Annual physical exam    -  Primary   Cervical cancer screening       Need for Tdap vaccination          No follow-ups on file.     Joy Jessup, NP   

## 2019-10-05 ENCOUNTER — Encounter: Payer: Self-pay | Admitting: Gastroenterology

## 2019-10-05 ENCOUNTER — Encounter: Payer: Self-pay | Admitting: Nurse Practitioner

## 2019-10-05 ENCOUNTER — Ambulatory Visit: Payer: Medicaid Other | Attending: Family Medicine | Admitting: Nurse Practitioner

## 2019-10-05 ENCOUNTER — Other Ambulatory Visit: Payer: Self-pay

## 2019-10-05 DIAGNOSIS — E1142 Type 2 diabetes mellitus with diabetic polyneuropathy: Secondary | ICD-10-CM | POA: Diagnosis not present

## 2019-10-05 DIAGNOSIS — Z794 Long term (current) use of insulin: Secondary | ICD-10-CM

## 2019-10-05 DIAGNOSIS — I10 Essential (primary) hypertension: Secondary | ICD-10-CM

## 2019-10-05 DIAGNOSIS — E1159 Type 2 diabetes mellitus with other circulatory complications: Secondary | ICD-10-CM

## 2019-10-05 DIAGNOSIS — Z1211 Encounter for screening for malignant neoplasm of colon: Secondary | ICD-10-CM

## 2019-10-05 DIAGNOSIS — Z7689 Persons encountering health services in other specified circumstances: Secondary | ICD-10-CM

## 2019-10-05 DIAGNOSIS — E785 Hyperlipidemia, unspecified: Secondary | ICD-10-CM

## 2019-10-05 DIAGNOSIS — I152 Hypertension secondary to endocrine disorders: Secondary | ICD-10-CM

## 2019-10-05 MED ORDER — ATORVASTATIN CALCIUM 20 MG PO TABS: 20.0000 mg | ORAL_TABLET | Freq: Every day | ORAL | 1 refills | Status: DC

## 2019-10-05 MED ORDER — BLOOD PRESSURE MONITOR DEVI: 0 refills | Status: AC

## 2019-10-05 NOTE — Progress Notes (Signed)
Virtual Visit via Telephone Note Due to national recommendations of social distancing due to [place] 19, telehealth visit is felt to be most appropriate for this patient at this time.  I discussed the limitations, risks, security and privacy concerns of performing an evaluation and management service by telephone and the availability of in person appointments. I also discussed with the patient that there may be a patient responsible charge related to this service. The patient expressed understanding and agreed to proceed.    I connected with [name] on 10/05/19  at  10:30 AM EDT  EDT by telephone and verified that I am speaking with the correct person using two identifiers.   Consent I discussed the limitations, risks, security and privacy concerns of performing an evaluation and management service by telephone and the availability of in person appointments. I also discussed with the patient that there may be a patient responsible charge related to this service. The patient expressed understanding and agreed to proceed.   Location of Patient: Private Residence    Location of Provider: [place] and [employer] Office    Persons participating in Telemedicine visit: [name] FNP-BC [place]    History of Present Illness: Telemedicine visit for: Establish Care  has a past medical history of Diabetes mellitus type II, uncontrolled ([place]), Hyperlipidemia, Hypertension, Other and unspecified angina pectoris, and Tobacco use, bilateral toe amputations.   DM TYPE 2 Diagnosed over 20 years ago.  Highest A1c 11.3.  Diabetes is not well controlled due to poor dietary adherence and lack of exercise.  He also has chronic kidney disease.  Currently limited in regard to exercise due to foot problem.  His diet consists mostly of carbohydrates and he states he does not eat any vegetables.  He sees Dr. Kelton Pillar for his diabetes. Taking Lantus 15 units daily and  Metformin 500 mg BID. Renal function has declined however currently on low dose metformin based on guidelines.  Currently seeing Dr. March Rummage with podiatry for ulcer of left foot with fat layer exposed.  Hyperglycemic symptoms include polyneuropathy for which he takes gabapentin. Monitoring blood glucose levels 1-2 times per day.  LDL at goal.  He is taking atorvastatin 20 mg daily Lab Results  Component Value Date   HGBA1C 8.8 (H) 06/29/2019   Lab Results  Component Value Date   LDLCALC 58 10/17/2018   Essential Hypertension Blood pressure has been labile.  He does not monitor his blood pressure at home as he does not have a device.  I have attempted to order for a blood pressure monitor through his insurance company.  He endorses medication compliance taking amlodipine 10 mg daily, carvedilol 25 mg twice daily, clonidine 0.1 mg twice daily Denies chest pain, shortness of breath, palpitations, lightheadedness, dizziness, headaches or BLE edema.  BP Readings from Last 3 Encounters:  07/19/19 (!) 166/92  07/16/19 132/84  07/02/19 (!) 143/83   Past Medical History:  Diagnosis Date  . Diabetes mellitus type II, uncontrolled ([place])   . Hyperlipidemia   . Hypertension   . Other and unspecified angina pectoris   . Tobacco use     Past Surgical History:  Procedure Laterality Date  . AMPUTATION Left 08/22/2016   Procedure: GREAT TOE AMPUTATION;  Surgeon: [name], MD;  Location: [facility];  Service: Orthopedics;  Laterality: Left;  . AMPUTATION Right 05/28/2017   Procedure: AMPUTATION 1st & 3rd TOE;  Surgeon: [name], MD;  Location: [facility];  Service: Orthopedics;  Laterality: Right;  . EYE SURGERY     Both Eye Lasik   . IRRIGATION AND DEBRIDEMENT FOOT Left 06/29/2019   Procedure: IRRIGATION AND DEBRIDEMENT FOOT application wound vac;  Surgeon: [name], DPM;  Location: [facility];  Service: Podiatry;  Laterality: Left;  . LEFT HEART CATHETERIZATION WITH CORONARY ANGIOGRAM N/A 07/31/2013    Procedure: LEFT HEART CATHETERIZATION WITH CORONARY ANGIOGRAM;  Surgeon: Jettie Booze, MD;  [facility] C[facility] LAB;  Service: Cardiovascular;  Laterality: N/A;  . spinal tap    . toe amputated      Family History  Problem Relation Age of Onset  . Cancer Mother   . Lung cancer Mother   . Diabetes Father   . Hypertension Father     Social History   Socioeconomic History  . Marital status: Legally Separated    Spouse name: Not on file  . Number of children: Not on file  . Years of education: Not on file  . Highest education level: Not on file  Occupational History  . Occupation: Leisure centre manager: [employer]  Tobacco Use  . Smoking status: Current Every Day Smoker    Packs/day: 1.00    Years: 20.00    Pack years: 20.00    Types: Cigarettes  . Smokeless tobacco: Never Used  . Tobacco comment: wants patches   Vaping Use  . Vaping Use: Never used  Substance and Sexual Activity  . Alcohol use: Not Currently    Comment: occ  . Drug use: No  . Sexual activity: Not Currently  Other Topics Concern  . Not on file  Social History Narrative   Works in [employer], recently moved in early 2018 from [place]. Previous saw free clinic providers.       Living with and helping take are of his elderly father.   Social Determinants of Health   Financial Resource Strain:   . Difficulty of Paying Living Expenses:   Food Insecurity:   . Worried About Charity fundraiser in the Last Year:   . Arboriculturist in the Last Year:   Transportation Needs:   . Film/video editor (Medical):   Marland Kitchen Lack of Transportation (Non-Medical):   Physical Activity:   . Days of Exercise per Week:   . Minutes of Exercise per Session:   Stress:   . Feeling of Stress :   Social Connections:   . Frequency of Communication with Friends and Family:   . Frequency of Social Gatherings with Friends and Family:   . Attends Religious Services:   . Active Member of Clubs or  Organizations:   . Attends Archivist Meetings:   Marland Kitchen Marital Status:      Observations/Objective: Awake, alert and oriented x 3   ROS  Assessment and Plan: Ravinder was seen today for new patient (initial visit).  Diagnoses and all orders for this visit:  Encounter to establish care  Type 2 diabetes mellitus with diabetic polyneuropathy, with long-term current use of insulin ([place]) Continue blood sugar control as discussed in office today, low carbohydrate diet, and regular physical exercise as tolerated, 150 minutes per week (30 min each day, 5 days per week, or 50 min 3 days per week). Keep blood sugar logs with fasting goal of 90-130 mg/dl, post prandial (after you eat) less than 180.  For Hypoglycemia: BS <60 and Hyperglycemia BS >400; contact the clinic ASAP. Annual eye exams  and foot exams are recommended.  Colon cancer screening -     Ambulatory referral to Gastroenterology  Hypertension associated with diabetes ([place]) -     Blood Pressure Monitor DEVI; Please provide patient with insurance approved blood pressure monitor. I10.0 Continue all antihypertensives as prescribed.  Remember to bring in your blood pressure log with you for your follow up appointment.  DASH/Mediterranean Diets are healthier choices for HTN.   Dyslipidemia, goal LDL below 70 -     atorvastatin (LIPITOR) 20 MG tablet; Take 1 tablet (20 mg total) by mouth daily. INSTRUCTIONS: Work on a low fat, heart healthy diet and participate in regular aerobic exercise program by working out at least 150 minutes per week; 5 days a week-30 minutes per day. Avoid red meat/beef/steak,  fried foods. junk foods, sodas, sugary drinks, unhealthy snacking, alcohol and smoking.  Drink at least 80 oz of water per day and monitor your carbohydrate intake daily.      Follow Up Instructions Return in about 4 weeks (around 11/02/2019).     I discussed the assessment and treatment plan with the patient. The patient was  provided an opportunity to ask questions and all were answered. The patient agreed with the plan and demonstrated an understanding of the instructions.   The patient was advised to call back or seek an in-person evaluation if the symptoms worsen or if the condition fails to improve as anticipated.  I provided 17 minutes of non-face-to-face time during this encounter including median intraservice time, reviewing previous notes, labs, imaging, medications and explaining diagnosis and management.  Gildardo Pounds, FNP-BC

## 2019-10-10 ENCOUNTER — Other Ambulatory Visit: Payer: Self-pay | Admitting: Nurse Practitioner

## 2019-10-10 ENCOUNTER — Other Ambulatory Visit: Payer: Self-pay

## 2019-10-10 ENCOUNTER — Ambulatory Visit: Payer: Medicaid Other | Attending: Nurse Practitioner

## 2019-10-10 DIAGNOSIS — E785 Hyperlipidemia, unspecified: Secondary | ICD-10-CM

## 2019-10-10 DIAGNOSIS — D72829 Elevated white blood cell count, unspecified: Secondary | ICD-10-CM

## 2019-10-10 DIAGNOSIS — E1159 Type 2 diabetes mellitus with other circulatory complications: Secondary | ICD-10-CM

## 2019-10-10 NOTE — Progress Notes (Addendum)
  Subjective:  Patient ID: [name], male    DOB: 19-Sep-1970,  MRN: [id number]  Chief Complaint  Patient presents with  . Wound Check    L plantar midfoot. Pt stated, "Doing better. 4/10 pain (improved). [facility] told me that they're not doing shoes right now". No pus/foul odor/fever/chills/N&V/significantly abnormal glucose.  . Follow-up    R plantar forefoot, submet 3-5. Pt stated, "The [submet 3] callus is the most painful - 6/10. No drainage".    49 y.o. male presents for wound care. Hx confirmed with patient.  Objective:  Physical Exam: Wound Location: left midfoot Wound Measurement: healed Wound Base: epithelized. Peri-wound: Macerated Exudate: Scant/small amount Serosanguinous exudate wound without warmth, erythema, signs of acute infection  Plantar H PK right submet three with small open ulcer upon debridement. Assessment:   1. Ulcer of left foot with fat layer exposed ([place])   2. Ulcerated, foot, right, limited to breakdown of skin [facility])    Plan:  Patient was evaluated and treated and all questions answered.  Ulcer left foot -Appears healed.  Would benefit from diabetic shoes.  Superficial ulcer right foot -Gently debrided dressed with Silvadene and Band-Aid  No follow-ups on file.

## 2019-10-10 NOTE — Progress Notes (Signed)
  Subjective:  Patient ID: [name], male    DOB: Jan 03, 1971,  MRN: [id number]  Chief Complaint  Patient presents with  . Follow-up    pt states "I'm doing good and that the [facility] nurse told me that because I am healing so well that she doesn't need to come out anymore". still having a throbbing sensation , but nothing severe     49 y.o. male presents for wound care. Hx confirmed with patient.  Objective:  Physical Exam: Wound Location: left midfoot Wound Measurement: 0.5x1 Wound Base: Granular/Healthy Peri-wound: Macerated Exudate: Scant/small amount Serosanguinous exudate wound without warmth, erythema, signs of acute infection  Assessment:   1. Ulcer of left foot with fat layer exposed ([place])    Plan:  Patient was evaluated and treated and all questions answered.  Ulcer left foot -Continues to improve. Almost fully healed -Discussed once the wound heals he will likely benefit from [place] fabrication given propensity of the area to ulcerate 2/2 midfoot charcot changes. -Dressed with silvadene and DSD. -No further VAC  No follow-ups on file.

## 2019-10-10 NOTE — Progress Notes (Signed)
  Subjective:  Patient ID: [name], male    DOB: 1970/09/15,  MRN: [id number]  Chief Complaint  Patient presents with  . Wound Check    Wound vac check. Pt states no concerns. Denies fever/chills/nausea/vomiting.    49 y.o. male presents for wound care. Hx confirmed with patient.  Objective:  Physical Exam: Wound Location: left midfoot Wound Measurement: 1.5x1 Wound Base: Granular/Healthy Peri-wound: Macerated Exudate: Scant/small amount Serosanguinous exudate wound without warmth, erythema, signs of acute infection  Assessment:   1. Ulcer of left foot with fat layer exposed ([place])    Plan:  Patient was evaluated and treated and all questions answered.  Ulcer left foot -Continues to improve. Almost fully healed -Discussed once the wound heals he will likely benefit from [place] fabrication given propensity of the area to ulcerate 2/2 midfoot charcot changes. -Dressed with silvadene and DSD. -No further VAC  No follow-ups on file.

## 2019-10-10 NOTE — Progress Notes (Signed)
  Subjective:  Patient ID: [name], male    DOB: 03-12-1971,  MRN: [id number]  Chief Complaint  Patient presents with  . wound vac    L foot wound vac change; "no concerns"    49 y.o. male presents for wound care. Hx confirmed with patient.  Objective:  Physical Exam: Wound Location: left midfoot Wound Measurement: 6*3.5 Wound Base: Granular/Healthy Peri-wound: Macerated Exudate: Scant/small amount Serosanguinous exudate wound without warmth, erythema, signs of acute infection    Assessment:   1. 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  Ulcer left midfoot -Wound cleansed and Betadine applied for maceration  Procedure: Wound VAC Application Location: left midfoot Wound Measurement: 6*3.5  Technique: Black foam to wound base, followed by adherent dressing. Set to 125 mmHg with good seal noted. Disposition: Patient tolerated procedure well.  No follow-ups on file.

## 2019-10-11 LAB — CMP14+EGFR
ALT: 8 IU/L (ref 0–44)
AST: 9 IU/L (ref 0–40)
Albumin/Globulin Ratio: 1.1 — ABNORMAL LOW (ref 1.2–2.2)
Albumin: 3.7 g/dL — ABNORMAL LOW (ref 4.0–5.0)
Alkaline Phosphatase: 101 IU/L (ref 48–121)
BUN/Creatinine Ratio: 10 (ref 9–20)
BUN: 21 mg/dL (ref 6–24)
Bilirubin Total: <0.2 mg/dL (ref 0.0–1.2)
CO2: 21 mmol/L (ref 20–29)
Calcium: 9.4 mg/dL (ref 8.7–10.2)
Chloride: 106 mmol/L (ref 96–106)
Creatinine, Ser: 2.2 mg/dL — ABNORMAL HIGH (ref 0.76–1.27)
GFR calc Af Amer: 39 mL/min/{1.73_m2} — ABNORMAL LOW (ref >59)
GFR calc non Af Amer: 34 mL/min/{1.73_m2} — ABNORMAL LOW (ref >59)
Globulin, Total: 3.3 g/dL (ref 1.5–4.5)
Glucose: 202 mg/dL — ABNORMAL HIGH (ref 65–99)
Potassium: 4.6 mmol/L (ref 3.5–5.2)
Sodium: 139 mmol/L (ref 134–144)
Total Protein: 7 g/dL (ref 6.0–8.5)

## 2019-10-11 LAB — CBC
Hematocrit: 36.6 % — ABNORMAL LOW (ref 37.5–51.0)
Hemoglobin: 11.6 g/dL — ABNORMAL LOW (ref 13.0–17.7)
MCH: 27.4 pg (ref 26.6–33.0)
MCHC: 31.7 g/dL (ref 31.5–35.7)
MCV: 86 fL (ref 79–97)
Platelets: 326 10*3/uL (ref 150–450)
RBC: 4.24 x10E6/uL (ref 4.14–5.80)
RDW: 13.6 % (ref 11.6–15.4)
WBC: 12.1 10*3/uL — ABNORMAL HIGH (ref 3.4–10.8)

## 2019-10-11 LAB — LIPID PANEL
Chol/HDL Ratio: 2.1 ratio (ref 0.0–5.0)
Cholesterol, Total: 97 mg/dL — ABNORMAL LOW (ref 100–199)
HDL: 47 mg/dL (ref >39)
LDL Chol Calc (NIH): 39 mg/dL (ref 0–99)
Triglycerides: 43 mg/dL (ref 0–149)
VLDL Cholesterol Cal: 11 mg/dL (ref 5–40)

## 2019-10-16 ENCOUNTER — Encounter: Payer: Medicaid Other | Attending: Internal Medicine | Admitting: Nutrition

## 2019-10-16 DIAGNOSIS — E1165 Type 2 diabetes mellitus with hyperglycemia: Secondary | ICD-10-CM | POA: Insufficient documentation

## 2019-10-19 ENCOUNTER — Other Ambulatory Visit: Payer: Self-pay

## 2019-10-19 ENCOUNTER — Encounter: Payer: Self-pay | Admitting: Internal Medicine

## 2019-10-19 ENCOUNTER — Ambulatory Visit (INDEPENDENT_AMBULATORY_CARE_PROVIDER_SITE_OTHER): Payer: Medicaid Other | Admitting: Internal Medicine

## 2019-10-19 ENCOUNTER — Encounter: Payer: Self-pay | Admitting: Podiatry

## 2019-10-19 ENCOUNTER — Ambulatory Visit (INDEPENDENT_AMBULATORY_CARE_PROVIDER_SITE_OTHER): Payer: Medicaid Other | Admitting: Podiatry

## 2019-10-19 VITALS — BP 168/98 | HR 102 | Ht 64.0 in | Wt 200.0 lb

## 2019-10-19 DIAGNOSIS — E1121 Type 2 diabetes mellitus with diabetic nephropathy: Secondary | ICD-10-CM

## 2019-10-19 DIAGNOSIS — E1142 Type 2 diabetes mellitus with diabetic polyneuropathy: Secondary | ICD-10-CM | POA: Diagnosis not present

## 2019-10-19 DIAGNOSIS — L97511 Non-pressure chronic ulcer of other part of right foot limited to breakdown of skin: Secondary | ICD-10-CM

## 2019-10-19 DIAGNOSIS — L97522 Non-pressure chronic ulcer of other part of left foot with fat layer exposed: Secondary | ICD-10-CM | POA: Diagnosis not present

## 2019-10-19 DIAGNOSIS — E113593 Type 2 diabetes mellitus with proliferative diabetic retinopathy without macular edema, bilateral: Secondary | ICD-10-CM

## 2019-10-19 DIAGNOSIS — Z794 Long term (current) use of insulin: Secondary | ICD-10-CM

## 2019-10-19 DIAGNOSIS — N1831 Chronic kidney disease, stage 3a: Secondary | ICD-10-CM

## 2019-10-19 LAB — POCT GLYCOSYLATED HEMOGLOBIN (HGB A1C): Hemoglobin A1C: 8.7 % — AB (ref 4.0–5.6)

## 2019-10-19 MED ORDER — LANTUS SOLOSTAR 100 UNIT/ML ~~LOC~~ SOPN: 15.0000 [IU] | PEN_INJECTOR | Freq: Every day | SUBCUTANEOUS | 11 refills | Status: DC

## 2019-10-19 MED ORDER — METFORMIN HCL 500 MG PO TABS: 500.0000 mg | ORAL_TABLET | Freq: Two times a day (BID) | ORAL | 3 refills | Status: DC

## 2019-10-19 MED ORDER — CLINDAMYCIN HCL 300 MG PO CAPS
300.0000 mg | ORAL_CAPSULE | Freq: Two times a day (BID) | ORAL | 0 refills | Status: DC
Start: 2019-10-19 — End: 2019-11-26

## 2019-10-19 NOTE — Progress Notes (Signed)
Name: [name]  Age/ Sex: 49 y.o., male   MRN/ DOB: [id number], Jul 16, 1970     PCP: Gildardo Pounds, NP   Reason for Endocrinology Evaluation: Type 2 Diabetes Mellitus     Initial Endocrinology Clinic Visit: 07/14/2018    PATIENT IDENTIFIER: [name] is a 49 y.o. male with a past medical history of T2DM, and HTN, Hx toe amputations Bilaterally  The patient has followed with Endocrinology clinic since 07/14/2018 for consultative assistance with management of his diabetes.  DIABETIC HISTORY:  [name] was diagnosed with T2DM over 20 years ago. He has been on Metformin since diagnosis, he also has been on glipizide and prandin but never took them. He was on insulin until sometime in 2019. His hemoglobin A1c has ranged from 7.0% in 2014, peaking at 11.3% in 2019.   Pt could not afford prandin nor glipizide and was switched to basal insulin in 02/2019   SUBJECTIVE:   During the last visit (07/16/2019): A1c 8.8% Continued Metformin and lantus    Today (10/19/2019): [name] is here for a 3 month  follow up on diabetes management.  He has not been checking his sugar , nor has he been taking his medications for at least the past 3 weeks, the pt admits to having insulin and metformin at home.  S/P left foot ulcer debridement 06/29/2019   Denies sob  Denies nausea or diarrhea     HOME DIABETES REGIMEN:  Metformin 500 mg XR 2 tab daily - stopped taking  Lantus 15 units daily - stopped 3 weeks     GLUCOSE LOG : Did not bring      HISTORY:  Past Medical History:  Past Medical History:  Diagnosis Date  . Diabetes mellitus type II, uncontrolled ([place])   . Hyperlipidemia   . Hypertension   . Other and unspecified angina pectoris   . Tobacco use    Past Surgical History:  Past Surgical History:  Procedure Laterality Date  . AMPUTATION Left 08/22/2016   Procedure: GREAT TOE AMPUTATION;  Surgeon: [name], MD;  Location: [facility];  Service:  Orthopedics;  Laterality: Left;  . AMPUTATION Right 05/28/2017   Procedure: AMPUTATION 1st & 3rd TOE;  Surgeon: [name], MD;  Location: [facility];  Service: Orthopedics;  Laterality: Right;  . EYE SURGERY     Both Eye Lasik   . IRRIGATION AND DEBRIDEMENT FOOT Left 06/29/2019   Procedure: IRRIGATION AND DEBRIDEMENT FOOT application wound vac;  Surgeon: [name], DPM;  Location: [facility];  Service: Podiatry;  Laterality: Left;  . LEFT HEART CATHETERIZATION WITH CORONARY ANGIOGRAM N/A 07/31/2013   Procedure: LEFT HEART CATHETERIZATION WITH CORONARY ANGIOGRAM;  Surgeon: Jettie Booze, MD;  Location: [facility] CATH LAB;  Service: Cardiovascular;  Laterality: N/A;  . spinal tap    . toe amputated      Social History:  reports that he has been smoking cigarettes. He has a 20.00 pack-year smoking history. He has never used smokeless tobacco. He reports previous alcohol use. He reports that he does not use drugs. Family History:  Family History  Problem Relation Age of Onset  . Cancer Mother   . Lung cancer Mother   . Diabetes Father   . Hypertension Father      HOME MEDICATIONS: Allergies as of 10/19/2019      Reactions   Bee Venom Swelling   SWELLING REACTION UNSPECIFIED    Penicillins Hives, Other (See  Comments)   PATIENT HAS HAD A PCN REACTION WITH IMMEDIATE RASH, FACIAL/TONGUE/THROAT SWELLING, SOB, OR LIGHTHEADEDNESS WITH HYPOTENSION:  **Can tolerate cephalosporins**   Clonidine Derivatives    Pt states "It is making me dizzy"      Medication List       Accurate as of October 19, 2019  9:11 AM. If you have any questions, ask your nurse or doctor.        Accu-Chek FastClix Lancets Misc 1 Package by Does not apply route as directed. Use as instructed to test blood sugar 2 times daily E11.65   amLODipine 10 MG tablet Commonly known as: NORVASC Take 1 tablet (10 mg total) by mouth daily.   aspirin EC 81 MG tablet Take 81 mg by mouth daily.   atorvastatin 20 MG  tablet Commonly known as: LIPITOR Take 1 tablet (20 mg total) by mouth daily.   b complex vitamins tablet Take 1 tablet by mouth daily.   blood glucose meter kit and supplies Kit Dispense based on patient and insurance preference. Use up to four times daily as directed. (FOR ICD-9 250.00, 250.01).   Blood Pressure Monitor Devi Please provide patient with insurance approved blood pressure monitor. I10.0   carvedilol 25 MG tablet Commonly known as: Coreg Take 1 tablet (25 mg total) by mouth 2 (two) times daily.   clindamycin 300 MG capsule Commonly known as: Cleocin Take 1 capsule (300 mg total) by mouth 2 (two) times daily. Started by: [name], DPM   cloNIDine 0.1 MG tablet Commonly known as: CATAPRES Take 1 tablet (0.1 mg total) by mouth 2 (two) times daily.   escitalopram 10 MG tablet Commonly known as: Lexapro Take 1 tablet (10 mg total) by mouth at bedtime.   ferrous sulfate 325 (65 FE) MG tablet Take 1 tablet (325 mg total) by mouth daily.   gabapentin 300 MG capsule Commonly known as: Neurontin Take 1 capsule (300 mg total) by mouth 3 (three) times daily. Patient needs office visit before refills will be given   glucose blood test strip Use as instructed to test blood sugar 2 times daily E11.65   Insulin Pen Needle 32G X 4 MM Misc 1 each by Does not apply route daily.   Lantus SoloStar 100 UNIT/ML Solostar Pen Generic drug: insulin glargine Inject 15 Units into the skin daily.   metFORMIN 500 MG tablet Commonly known as: GLUCOPHAGE Take 1 tablet (500 mg total) by mouth 2 (two) times daily with a meal.   senna-docusate 8.6-50 MG tablet Commonly known as: Senokot-S Take 2 tablets by mouth daily.   silver sulfADIAZINE 1 % cream Commonly known as: Silvadene Apply 1 application topically daily.   Vitamin D3 50 MCG (2000 UT) capsule Take 1 capsule (2,000 Units total) by mouth daily.      OBJECTIVE:    PHYSICAL EXAM: VS: BP (!) 168/98 (BP  Location: Left Arm, Patient Position: Sitting, Cuff Size: Normal)   Pulse 102   Ht _0  (1.626 m)   Wt 200 lb (90.7 kg)   SpO2 98%   BMI 34.33 kg/m    EXAM: General: Pt appears well and is in NAD  Lungs: Clear with good BS bilat with no rales, rhonchi, or wheezes  Heart: Auscultation: RRR with normal S1 and S2  Extremities: BL LE: no pretibial edema on the right, left boot in place  Mental Status: Judgment, insight: intact Orientation: oriented to time, place, and person Mood and affect: no depression, anxiety, or agitation  DATA REVIEWED:  Lab Results  Component Value Date   HGBA1C 8.7 (A) 10/19/2019   HGBA1C 8.8 (H) 06/29/2019   HGBA1C 9.0 (H) 06/28/2019   Lab Results  Component Value Date   MICROALBUR 92.0 (H) 06/01/2017   LDLCALC 39 10/10/2019   CREATININE 2.20 (H) 10/10/2019   Lab Results  Component Value Date   MICRALBCREAT 50.3 (H) 06/01/2017     Lab Results  Component Value Date   CHOL 97 (L) 10/10/2019   HDL 47 10/10/2019   LDLCALC 39 10/10/2019   TRIG 43 10/10/2019   CHOLHDL 2.1 10/10/2019         ASSESSMENT / PLAN / RECOMMENDATIONS:   1) Type 2 Diabetes Mellitus, Poorly controlled, With Neuropathic complications, retinopathy , CKD III and B/L toe amputations - Most recent A1c of 8.7 %. Goal A1c <  7.0 %.   - Poorly controlled diabetes due to medication non-adherence, we again discussed the risk of microvascular complications to include renal failure, increased risk of infections , delayed healing as well as amputations with hyperglycemia. - Pt counseled about the importance of compliance with medication intake.   MEDICATIONS:  Restart Metformin 500 mg XR 2 tabs daily   Restart Lantus 15 units daily    EDUCATION / INSTRUCTIONS:  BG monitoring instructions: Patient is instructed to check his blood sugars 2 times a day, fasting and bedtime.  Call [place] [facility] if: BG persistently < 70 or > 300. . I reviewed the Rule of 15  for the treatment of hypoglycemia in detail with the patient. Literature supplied.     F/U in 6 months    Signed electronically by: Mack Guise, MD  [facility] Endocrinology  [facility] [address] [place], [place] [address] Phone: [phone] FAX: [phone]   CC: Gildardo Pounds, NP [place] [address] Phone: [phone]  Fax: [phone]  Return to Endocrinology clinic as below: Future Appointments  Date Time Provider [place]  11/01/2019  8:50 AM Argentina Donovan, PA-C [facility] None  11/07/2019  2:00 PM Ocie Doyne, RN [place] [facility]  11/16/2019  9:10 AM Gildardo Pounds, NP [facility] None  11/26/2019 10:00 AM [facility] PREVISIT RM 51 [facility]  12/10/2019  9:30 AM Armbruster, Carlota Raspberry, MD [facility]

## 2019-10-19 NOTE — Progress Notes (Signed)
°  Subjective:  Patient ID: [name], male    DOB: 1970/07/13,  MRN: [id number]  Chief Complaint  Patient presents with   Foot Ulcer    i have some spots on both feet and they are starting to smell and i was to get some kind of boot    49 y.o. male presents for wound care. Hx confirmed with patient.  Reports severe malodor to both feet.  Missed his last appointment due to car trouble. Objective:  Physical Exam: Wound Location: left midfoot Wound Measurement: 6x5 Wound Base: fibronecrotic Peri-wound: hyperkeratotic, blistered rim Exudate: pink caseous discharge, appearance of macerated skin cells wound without warmth, erythema, signs of acute infection  Wound Location: right submet 2/3 Wound Measurement: 4x4 post debridement Wound Base: fibrogranular Peri-wound: Hyperkeratotic Exudate: pink caseous discharge, appearance of macerated skin cells wound without warmth, erythema, signs of acute infection  Assessment:   1. Ulcer of left foot with fat layer exposed ([place])   2. Ulcerated, foot, right, limited to breakdown of skin [facility])    Plan:  Patient was evaluated and treated and all questions answered.  Ulcers bilateral feet -Debrided as below -Both wounds without frank purulence, but with malodorous macerated skin cells -Rx doxycycline as prophylaxis -Dressed with Betadine wet-to-dry bilaterally -Patient to performed Epsom salt soaks to promote drainage and to thoroughly cleanse the areas -Discussed the importance of compliance with appointments if he hopes for salvage of his foot. -We will have to hold off putting for boot to the left side for right now.  Once we get the left foot more healed we can look into fitting for a Crow boot left  Procedure: Excisional Debridement of Wound Indication: Removal of non-viable soft tissue from the wound to promote healing.  Anesthesia: none Pre-Debridement Wound Measurements: overlying HPK  Post-Debridement Wound Measurements:  6x5x0.3; 4x4x0x2   Type of Debridement: Sharp Excisional Tissue Removed: Non-viable soft tissue Instrumentation: 15 blade and tissue nipper Depth of Debridement: subcutaneous tissue. Technique: Sharp excisional debridement to bleeding, viable wound base.  Dressing: Dry, sterile, compression dressing. Disposition: Patient tolerated procedure well. Patient to return in 1 week for follow-up.  No follow-ups on file.

## 2019-10-19 NOTE — Patient Instructions (Signed)
-   Restart Metformin 500 mg TWO tablet daily  - Restart  Lantus 15 units daily      HOW TO TREAT LOW BLOOD SUGARS (Blood sugar LESS THAN 70 MG/DL)  Please follow the RULE OF 15 for the treatment of hypoglycemia treatment (when your (blood sugars are less than 70 mg/dL)    STEP 1: Take 15 grams of carbohydrates when your blood sugar is low, which includes:   3-4 GLUCOSE TABS  OR  3-4 OZ OF JUICE OR REGULAR SODA OR  ONE TUBE OF GLUCOSE GEL     STEP 2: RECHECK blood sugar in 15 MINUTES STEP 3: If your blood sugar is still low at the 15 minute recheck --> then, go back to STEP 1 and treat AGAIN with another 15 grams of carbohydrates.

## 2019-10-22 ENCOUNTER — Other Ambulatory Visit: Payer: Self-pay | Admitting: Nurse Practitioner

## 2019-10-22 NOTE — Telephone Encounter (Signed)
Medication: amLODipine (NORVASC) 10 MG tablet [[id number]] , aspirin EC 81 MG tablet [[id number]] , atorvastatin (LIPITOR) 20 MG tablet [[id number]] , carvedilol (COREG) 25 MG tablet [[id number]] , clindamycin (CLEOCIN) 300 MG capsule [[id number]] , cloNIDine (CATAPRES) 0.1 MG tablet [[id number]] , escitalopram (LEXAPRO) 10 MG tablet [[id number]] , ferrous sulfate 325 (65 FE) MG tablet [[id number]]  ENDED, gabapentin (NEURONTIN) 300 MG capsule [[id number]] , senna-docusate (SENOKOT-S) 8.6-50 MG tablet [[id number]] , Cholecalciferol (VITAMIN D3) 50 MCG (2000 UT) capsule [[id number]]   Has the patient contacted their pharmacy? YES (Agent: If no, request that the patient contact the pharmacy for the refill.) (Agent: If yes, when and what did the pharmacy advise?)  Preferred Pharmacy (with phone number or street name): [facility] [place]  Phone:  [phone] Fax:  [phone]     Agent: Please be advised that RX refills may take up to 3 business days. We ask that you follow-up with your pharmacy.

## 2019-10-22 NOTE — Telephone Encounter (Signed)
[place] called and spoke to [employer] about the refills requested. I asked if they received the refills sent for Atorvastatin sent on 7/9 #90/1 refill-Yes Carvedilol sent on 4/1 #60/11 refills-No Clindamycin-picked up by patient 7/23 Clonidine sent 4/21 #60/11 refills-Yes Senokot sent 4/22 #100/6 refills-Yes; she will refill the one's above that they have.

## 2019-10-22 NOTE — Telephone Encounter (Signed)
Requested medication (s) are due for refill today: Yes  Requested medication (s) are on the active medication list: Yes  Last refill:  All expired or refilled by another provider  Future visit scheduled: Yes  Notes to clinic: Unable to refill due to expired Rx and last refilled by another provider     Requested Prescriptions  Pending Prescriptions Disp Refills   aspirin EC 81 MG tablet 30 tablet     Sig: Take 1 tablet (81 mg total) by mouth daily.      Analgesics:  NSAIDS - aspirin Passed - 10/22/2019  5:52 PM      Passed - Patient is not pregnant      Passed - Valid encounter within last 12 months    Recent Outpatient Visits           2 weeks ago Encounter to establish care   [place], Vernia Buff, NP   3 years ago Hypertension, unspecified type   [place], Dionne Bucy, [place]       Future Appointments             In 1 week Argentina Donovan, PA-C [place]   In 3 weeks Gildardo Pounds, NP [place]              escitalopram (LEXAPRO) 10 MG tablet 90 tablet 1    Sig: Take 1 tablet (10 mg total) by mouth at bedtime.      Psychiatry:  Antidepressants - SSRI Passed - 10/22/2019  5:52 PM      Passed - Completed PHQ-2 or PHQ-9 in the last 360 days.      Passed - Valid encounter within last 6 months    Recent Outpatient Visits           2 weeks ago Encounter to establish care   [place], Vernia Buff, NP   3 years ago Hypertension, unspecified type   [place], Dionne Bucy, PA-C       Future Appointments             In 1 week Argentina Donovan, PA-C [place]   In 3 weeks Gildardo Pounds, NP [place]              ferrous sulfate 325 (65 FE) MG tablet 90 tablet 1    Sig: Take 1  tablet (325 mg total) by mouth daily.      Endocrinology:  Minerals - Iron Supplementation Failed - 10/22/2019  5:52 PM      Failed - HGB in normal range and within 360 days    Hemoglobin  Date Value Ref Range Status  10/10/2019 11.6 (L) 13.0 - 17.7 g/dL Final          Failed - HCT in normal range and within 360 days    Hematocrit  Date Value Ref Range Status  10/10/2019 36.6 (L) 37.5 - 51.0 % Final          Failed - Fe (serum) in normal range and within 360 days    Iron  Date Value Ref Range Status  06/01/2017 38 (L) 42 - 165 ug/dL Final  06/01/2017 38 (L) 42 - 165 ug/dL Final   Saturation Ratios  Date Value Ref Range Status  06/01/2017 12.5 (  L) 20.0 - 50.0 % Final          Failed - Ferritin in normal range and within 360 days    Ferritin  Date Value Ref Range Status  06/01/2017 172.0 22.0 - 322.0 ng/mL Final          Passed - RBC in normal range and within 360 days    RBC  Date Value Ref Range Status  10/10/2019 4.24 4.14 - 5.80 x10E6/uL Final  07/01/2019 3.35 (L) 4.22 - 5.81 MIL/uL Final          Passed - Valid encounter within last 12 months    Recent Outpatient Visits           2 weeks ago Encounter to establish care   [place], Vernia Buff, NP   3 years ago Hypertension, unspecified type   [place], Dionne Bucy, [place]       Future Appointments             In 1 week Argentina Donovan, PA-C [place]   In 3 weeks Gildardo Pounds, NP [place]              gabapentin (NEURONTIN) 300 MG capsule 270 capsule 3    Sig: Take 1 capsule (300 mg total) by mouth 3 (three) times daily. Patient needs office visit before refills will be given      Neurology: Anticonvulsants - gabapentin Passed - 10/22/2019  5:52 PM      Passed - Valid encounter within last 12 months    Recent Outpatient Visits           2 weeks ago  Encounter to establish care   [place], Vernia Buff, NP   3 years ago Hypertension, unspecified type   [place], Dionne Bucy, PA-C       Future Appointments             In 1 week Argentina Donovan, PA-C [place]   In 3 weeks Gildardo Pounds, NP [place]              Cholecalciferol (VITAMIN D3) 50 MCG (2000 UT) capsule 100 capsule 3    Sig: Take 1 capsule (2,000 Units total) by mouth daily.      Endocrinology:  Vitamins - Vitamin D Supplementation Failed - 10/22/2019  5:52 PM      Failed - 50,000 IU strengths are not delegated      Failed - Phosphate in normal range and within 360 days    No results found for: PHOS        Failed - Vitamin D in normal range and within 360 days    Vit D, 1,25-Dihydroxy  Date Value Ref Range Status  01/19/2008 13 (L) 30 - 89 Final    Comment:    See lab report for associated comment(s)   VITD  Date Value Ref Range Status  01/24/2019 14.82 (L) 30.00 - 100.00 ng/mL Final          Passed - Ca in normal range and within 360 days    Calcium  Date Value Ref Range Status  10/10/2019 9.4 8.7 - 10.2 mg/dL Final   Calcium, Ion  Date Value Ref Range Status  04/23/2011 1.17 1.12 - 1.32  mmol/L Final          Passed - Valid encounter within last 12 months    Recent Outpatient Visits           2 weeks ago Encounter to establish care   [name], [place], NP   3 years ago Hypertension, unspecified type   [place], Dionne Bucy, PA-C       Future Appointments             In 1 week Argentina Donovan, PA-C [place]   In 3 weeks Gildardo Pounds, NP [place]              carvedilol (COREG) 25 MG tablet 60 tablet 11    Sig: Take 1 tablet (25 mg total)  by mouth 2 (two) times daily.      Cardiovascular:  Beta Blockers Failed - 10/22/2019  5:52 PM      Failed - Last BP in normal range    BP Readings from Last 1 Encounters:  10/19/19 (!) 168/98          Passed - Last Heart Rate in normal range    Pulse Readings from Last 1 Encounters:  10/19/19 102          Passed - Valid encounter within last 6 months    Recent Outpatient Visits           2 weeks ago Encounter to establish care   [place], Vernia Buff, NP   3 years ago Hypertension, unspecified type   [place], Dionne Bucy, [place]       Future Appointments             In 1 week [place], Dionne Bucy, PA-C [place]   In 3 weeks Gildardo Pounds, NP [place]

## 2019-10-23 MED ORDER — GABAPENTIN 300 MG PO CAPS: 300.0000 mg | ORAL_CAPSULE | Freq: Three times a day (TID) | ORAL | 3 refills | Status: DC

## 2019-10-23 MED ORDER — ASPIRIN EC 81 MG PO TBEC: 81.0000 mg | DELAYED_RELEASE_TABLET | Freq: Every day | ORAL | 2 refills | Status: DC

## 2019-10-23 MED ORDER — FERROUS SULFATE 325 (65 FE) MG PO TABS: 325.0000 mg | ORAL_TABLET | Freq: Every day | ORAL | 1 refills | Status: DC

## 2019-10-23 MED ORDER — ESCITALOPRAM OXALATE 10 MG PO TABS: 10.0000 mg | ORAL_TABLET | Freq: Every day | ORAL | 1 refills | Status: DC

## 2019-10-23 MED ORDER — CARVEDILOL 25 MG PO TABS: 25.0000 mg | ORAL_TABLET | Freq: Two times a day (BID) | ORAL | 1 refills | Status: DC

## 2019-10-23 MED ORDER — VITAMIN D3 50 MCG (2000 UT) PO CAPS: 2000.0000 [IU] | ORAL_CAPSULE | Freq: Every day | ORAL | 2 refills | Status: DC

## 2019-11-01 ENCOUNTER — Ambulatory Visit: Payer: Medicaid Other | Attending: Physician Assistant | Admitting: Physician Assistant

## 2019-11-01 ENCOUNTER — Other Ambulatory Visit: Payer: Self-pay

## 2019-11-01 DIAGNOSIS — L299 Pruritus, unspecified: Secondary | ICD-10-CM | POA: Diagnosis not present

## 2019-11-01 DIAGNOSIS — R21 Rash and other nonspecific skin eruption: Secondary | ICD-10-CM | POA: Diagnosis not present

## 2019-11-01 MED ORDER — CETIRIZINE HCL 10 MG PO TABS: 10.0000 mg | ORAL_TABLET | Freq: Every day | ORAL | 11 refills | Status: DC

## 2019-11-01 NOTE — Progress Notes (Signed)
Virtual Visit via Telephone Note  I connected with [name] on 11/01/19 at  8:50 AM EDT by telephone and verified that I am speaking with the correct person using two identifiers.   I discussed the limitations, risks, security and privacy concerns of performing an evaluation and management service by telephone and the availability of in person appointments. I also discussed with the patient that there may be a patient responsible charge related to this service. The patient expressed understanding and agreed to proceed.  PATIENT visit by telephone virtually in the context of Covid-19 pandemic. Patient location:  home My Location:  [place] office Persons on the call:  Me and the patient  History of Present Illness:  He has been having a rash that itches on his chest now for over a year.  He also gets lesions on his elbows and upper buttocks at times.  Not painful.  No fevers.  He has not tried any creams.  He says it is reminiscent of when he had chicken pox.    Just saw endocrine and A1C=8.7 last month    Observations/Objective:  NAD.  A&Ox3   Assessment and Plan: 1. Rash Will see if this helps in the event it is an allergic type rash(when appt was made, he did not say he had a rash, hence the virtual appt.) - Ambulatory referral to Dermatology - cetirizine (ZYRTEC) 10 MG tablet; Take 1 tablet (10 mg total) by mouth daily. Prn itching  Dispense: 30 tablet; Refill: 11  2. Itching - cetirizine (ZYRTEC) 10 MG tablet; Take 1 tablet (10 mg total) by mouth daily. Prn itching  Dispense: 30 tablet; Refill: 11    Follow Up Instructions: See PCP in 2-3 months   I discussed the assessment and treatment plan with the patient. The patient was provided an opportunity to ask questions and all were answered. The patient agreed with the plan and demonstrated an understanding of the instructions.   The patient was advised to call back or seek an in-person evaluation if the symptoms worsen or if  the condition fails to improve as anticipated.  I provided 9 minutes of non-face-to-face time during this encounter.   [place]eman Caldron, PA-C  Patient ID: TRONG GOSLING, male   DOB: 1970-05-24, 49 y.o.   MRN: [id number]

## 2019-11-06 ENCOUNTER — Other Ambulatory Visit: Payer: Self-pay

## 2019-11-06 ENCOUNTER — Ambulatory Visit: Payer: Medicaid Other | Admitting: Podiatry

## 2019-11-06 DIAGNOSIS — L97522 Non-pressure chronic ulcer of other part of left foot with fat layer exposed: Secondary | ICD-10-CM

## 2019-11-06 DIAGNOSIS — L97511 Non-pressure chronic ulcer of other part of right foot limited to breakdown of skin: Secondary | ICD-10-CM

## 2019-11-06 NOTE — Progress Notes (Addendum)
  Subjective:  Patient ID: [name], male    DOB: 1970/12/26,  MRN: [id number]  Chief Complaint  Patient presents with  . Wound Check    L plantar midfoot. Pt stated, "The odor resolved with doxycycline. No drainage. It's still pretty sore". No fever/chills/N&V.  Marland Kitchen Callouses    R plantar forefoot. Pt stated, "They feel the same".  . Diabetes    Most recent HgbA1c on file = 8.7.   49 y.o. male presents for wound care. Hx confirmed with patient.  Thinks the wounds are doing much better. Objective:  Physical Exam: Wound Location: left midfoot Wound Measurement: 0.5x1 Wound Base: fibronecrotic Peri-wound: hyperkeratotic, blistered rim Exudate: scant pink caseous discharge, appearance of macerated skin cells wound without warmth, erythema, signs of acute infection  Wound Location: right submet 2/3 Wound Measurement: epithelialized post debridement Wound Base: epithelialized Peri-wound: Hyperkeratotic wound without warmth, erythema, signs of acute infection  Assessment:   1. Ulcer of left foot with fat layer exposed ([place])   2. Ulcerated, foot, right, limited to breakdown of skin [facility])    Plan:  Patient was evaluated and treated and all questions answered.  Ulcers bilateral feet -Debrided as below -Rx for [employer] for DM shoes and toe filler left. Would consider brace if left foot insert does not alleviate pressure sufficiently. -Dressed with betadine and DSD.  Procedure: Selective Debridement of Wound Rationale: Removal of devitalized tissue from the wound to promote healing.  Pre-Debridement Wound Measurements: 0.5 cm x 1 cm x 0.2 cm  Post-Debridement Wound Measurements: same as pre-debridement. Type of Debridement: sharp selective Tissue Removed: Devitalized soft-tissue Dressing: Dry, sterile, compression dressing. Disposition: Patient tolerated procedure well. Patient to return in 1 week for follow-up.    No follow-ups on file.

## 2019-11-07 ENCOUNTER — Encounter: Payer: Medicaid Other | Attending: Internal Medicine | Admitting: Nutrition

## 2019-11-07 ENCOUNTER — Other Ambulatory Visit: Payer: Self-pay

## 2019-11-07 DIAGNOSIS — E1165 Type 2 diabetes mellitus with hyperglycemia: Secondary | ICD-10-CM | POA: Diagnosis not present

## 2019-11-07 DIAGNOSIS — IMO0002 Reserved for concepts with insufficient information to code with codable children: Secondary | ICD-10-CM

## 2019-11-07 DIAGNOSIS — E1149 Type 2 diabetes mellitus with other diabetic neurological complication: Secondary | ICD-10-CM

## 2019-11-08 ENCOUNTER — Telehealth: Payer: Self-pay | Admitting: *Deleted

## 2019-11-08 NOTE — Progress Notes (Signed)
Patient feels that he is not making progress in his diet/blood sugar control. He is feeling like he needing more help with unhealthy choices that he is making.  Money is a strong issue with food choices, plus girlfriend has MS with constant care, and refusal to help with food prep, or house work.  Says stress level is high at home, and does not feel like cooking and cleaning up.   SBGM: tests once a day, Fasting:  Today blood sugar was 138. Exercise: has stationary bike, but does not use it due to foot problems.   Diet history reveals he is sleeping until 10AM,  12PM: balogna sandwich, no mayo and flavored water to drink-no calories 8-9PM: bag of chips that he eats all evening while watching TV.  Drinking water, or unsweet tea during day.  Says has stopped drinking sweet drinks  Does not take a mulivitamin, but does take Vit.D  Discussed need to take with with balogna sandwich.  He agreed to do this. Discussion: 1.  Gave much praise for stopping sweet drinks. And wanting to do some exercise 2.  Suggested he wear a Phylliss Blakes for 14 days and return with reader for review of blood sugars.  Believe that this might help to motivate him to change night time eating habits, and start eating more vegetables and fruit.   He agreed to wear the Elenor Legato, and a LIbre 14 day sensor Lot number:  Exp. Date   Was inserted into his right upper, outer arm and he was given a log book to record exercise time and other meals eaten that are different from the above diet.  He agreed to do this and return in 2 weeks for download 3.  Need for exercise.  Suggested talking with food doctor first, about using the bike, but with no resistance on pedals and to make sure shoes fit properly before starting this.  Work up to 20 min. Daily.  And to start back doing chair exercises of holding a weighted object, like a can of vegetables in each hand and raising arms above head, to the sides and holding for 20 sec.  He re demonstrated  this exercise and 2 other shown.

## 2019-11-08 NOTE — Patient Instructions (Addendum)
Return 2 weeks for review of Libre sensor readings Do chair exercises shown, for 20 min.  Call foot doctor and ask if can start back on bike with no resistance on the pedlas, for 10-15  minutes daily Try raw veg., like broccoli carrots and celery for HS snacking, with light ranch dressing

## 2019-11-08 NOTE — Telephone Encounter (Signed)
No contra-indications to using exercise bike

## 2019-11-08 NOTE — Telephone Encounter (Signed)
Pt states his dietician needs to know if he can use the exercise bike after his surgery on the feet.

## 2019-11-09 NOTE — Telephone Encounter (Signed)
Left message for pt to call for information requested 11/08/2019.

## 2019-11-09 NOTE — Telephone Encounter (Signed)
I informed pt of Dr. Eleanora Neighbor okay for him to use the bike. Pt states understanding and said [employer] does not take his Medicaid for diabetic shoes. I told pt I would in for the Diabetic shoe coordinator and she knew of a facility that did and would call him.

## 2019-11-16 ENCOUNTER — Other Ambulatory Visit: Payer: Self-pay

## 2019-11-16 ENCOUNTER — Encounter: Payer: Self-pay | Admitting: Nurse Practitioner

## 2019-11-16 ENCOUNTER — Ambulatory Visit: Payer: Medicaid Other | Attending: Nurse Practitioner | Admitting: Nurse Practitioner

## 2019-11-16 VITALS — BP 197/117 | HR 92 | Temp 97.7°F | Ht 65.5 in | Wt 200.0 lb

## 2019-11-16 DIAGNOSIS — Z794 Long term (current) use of insulin: Secondary | ICD-10-CM | POA: Diagnosis not present

## 2019-11-16 DIAGNOSIS — E1159 Type 2 diabetes mellitus with other circulatory complications: Secondary | ICD-10-CM

## 2019-11-16 DIAGNOSIS — L089 Local infection of the skin and subcutaneous tissue, unspecified: Secondary | ICD-10-CM

## 2019-11-16 DIAGNOSIS — E1142 Type 2 diabetes mellitus with diabetic polyneuropathy: Secondary | ICD-10-CM | POA: Diagnosis not present

## 2019-11-16 DIAGNOSIS — I10 Essential (primary) hypertension: Secondary | ICD-10-CM

## 2019-11-16 DIAGNOSIS — E785 Hyperlipidemia, unspecified: Secondary | ICD-10-CM | POA: Diagnosis not present

## 2019-11-16 LAB — GLUCOSE, POCT (MANUAL RESULT ENTRY): POC Glucose: 184 mg/dl — AB (ref 70–99)

## 2019-11-16 MED ORDER — GLUCOSE BLOOD VI STRP: ORAL_STRIP | 12 refills | Status: DC

## 2019-11-16 MED ORDER — AMLODIPINE BESYLATE 10 MG PO TABS: 10.0000 mg | ORAL_TABLET | Freq: Every day | ORAL | 3 refills | Status: DC

## 2019-11-16 MED ORDER — CARVEDILOL 25 MG PO TABS: 25.0000 mg | ORAL_TABLET | Freq: Two times a day (BID) | ORAL | 1 refills | Status: DC

## 2019-11-16 MED ORDER — ACCU-CHEK FASTCLIX LANCETS MISC: 1.0000 | 12 refills | Status: DC

## 2019-11-16 MED ORDER — SULFAMETHOXAZOLE-TRIMETHOPRIM 400-80 MG PO TABS: 2.0000 | ORAL_TABLET | Freq: Two times a day (BID) | ORAL | 0 refills | Status: AC

## 2019-11-16 MED ORDER — ATORVASTATIN CALCIUM 20 MG PO TABS: 20.0000 mg | ORAL_TABLET | Freq: Every day | ORAL | 1 refills | Status: DC

## 2019-11-16 MED ORDER — CLONIDINE HCL 0.1 MG PO TABS
0.2000 mg | ORAL_TABLET | Freq: Once | ORAL | Status: AC
  Administered 2019-11-16: 0.2 mg via ORAL

## 2019-11-16 MED ORDER — LISINOPRIL 5 MG PO TABS: 5.0000 mg | ORAL_TABLET | Freq: Every day | ORAL | 3 refills | Status: DC

## 2019-11-16 NOTE — Progress Notes (Signed)
Assessment & Plan:  [name] was seen today for follow-up.  Diagnoses and all orders for this visit:  Type 2 diabetes mellitus with diabetic polyneuropathy, with long-term current use of insulin ([name]) -     Glucose (CBG) -     glucose blood test strip; Use as instructed to test blood sugar 2 times daily E11.65 -     Accu-Chek FastClix Lancets MISC; 1 Package by Does not apply route as directed. Use as instructed to test blood sugar 2 times daily E11.65 Continue blood sugar control as discussed in office today, low carbohydrate diet, and regular physical exercise as tolerated, 150 minutes per week (30 min each day, 5 days per week, or 50 min 3 days per week). Keep blood sugar logs with fasting goal of 90-130 mg/dl, post prandial (after you eat) less than 180.  For Hypoglycemia: BS <60 and Hyperglycemia BS >400; contact the clinic ASAP. Annual eye exams and foot exams are recommended.   Essential hypertension -     cloNIDine (CATAPRES) tablet 0.2 mg -     amLODipine (NORVASC) 10 MG tablet; Take 1 tablet (10 mg total) by mouth daily. -     carvedilol (COREG) 25 MG tablet; Take 1 tablet (25 mg total) by mouth 2 (two) times daily. -     lisinopril (ZESTRIL) 5 MG tablet; Take 1 tablet (5 mg total) by mouth daily. Continue all antihypertensives as prescribed.  Remember to bring in your blood pressure log with you for your follow up appointment.  DASH/Mediterranean Diets are healthier choices for HTN.    Dyslipidemia, goal LDL below 70 -     atorvastatin (LIPITOR) 20 MG tablet; Take 1 tablet (20 mg total) by mouth daily. INSTRUCTIONS: Work on a low fat, heart healthy diet and participate in regular aerobic exercise program by working out at least 150 minutes per week; 5 days a week-30 minutes per day. Avoid red meat/beef/steak,  fried foods. junk foods, sodas, sugary drinks, unhealthy snacking, alcohol and smoking.  Drink at least 80 oz of water per day and monitor your carbohydrate intake daily.    Skin infection -     sulfamethoxazole-trimethoprim (BACTRIM) 400-80 MG tablet; Take 2 tablets by mouth 2 (two) times daily for 7 days.    Patient has been counseled on age-appropriate routine health concerns for screening and prevention. These are reviewed and up-to-date. Referrals have been placed accordingly. Immunizations are up-to-date or declined.    Subjective:   Chief Complaint  Patient presents with  . Follow-up    Pt. is here for hypertension follow up.    HPI [name] 49 y.o. male presents to office today for follow up.  has a past medical history of Diabetes mellitus type II, uncontrolled ([place]), Hyperlipidemia, Hypertension, Other and unspecified angina pectoris, and Tobacco use.   Essential Hypertension Not well controlled. Required 0.2mg  of clonidine today. Added lisinopril 5mg  today. He endorses medication adherence taking amlodipine 10 mg daily, carvedilol 25 mg BID, clonidine 0.1 mg BID. Denies chest pain, shortness of breath, palpitations, lightheadedness, dizziness, headaches or BLE edema. He continues to smoke.   BP Readings from Last 3 Encounters:  11/16/19 (!) 197/117  10/19/19 (!) 168/98  07/19/19 (!) 166/92    DM TYPE 2 Not well controlled. LDL at goal. Current medications: atorvastatin 20 mg daily, lantus 15 units daily and metformin 500 MG BID.  He takes gabapentin 300 mg TID for neuropathy.  Lab Results  Component Value Date   LDLCALC 39  10/10/2019   Lab Results  Component Value Date   HGBA1C 8.7 (A) 10/19/2019     Rash: Patient complains of rash involving the bilateral arms, legs and torso. Rash started several months ago. Appearance of rash at onset: Other appearance: similar to previous chicken pox lesions.  Discomfort associated with rash: is pruritic.  Patient has had previous evaluation of rash. Patient has not had previous treatment.   Patient has not identified precipitant. Patient has not had new exposures (soaps, lotions, laundry  detergents, foods, medications, plants, insects or animals.)  Review of Systems  Constitutional: Negative for fever, malaise/fatigue and weight loss.  HENT: Negative.  Negative for nosebleeds.   Eyes: Negative.  Negative for blurred vision, double vision and photophobia.  Respiratory: Negative.  Negative for cough and shortness of breath.   Cardiovascular: Negative.  Negative for chest pain, palpitations and leg swelling.  Gastrointestinal: Negative.  Negative for heartburn, nausea and vomiting.  Musculoskeletal: Negative.  Negative for myalgias.  Skin: Positive for itching and rash.  Neurological: Negative.  Negative for dizziness, focal weakness, seizures and headaches.  Psychiatric/Behavioral: Negative.  Negative for suicidal ideas.    Past Medical History:  Diagnosis Date  . Diabetes mellitus type II, uncontrolled ([place])   . Hyperlipidemia   . Hypertension   . Other and unspecified angina pectoris   . Tobacco use     Past Surgical History:  Procedure Laterality Date  . AMPUTATION Left 08/22/2016   Procedure: GREAT TOE AMPUTATION;  Surgeon: [name], MD;  Location: [place];  Service: Orthopedics;  Laterality: Left;  . AMPUTATION Right 05/28/2017   Procedure: AMPUTATION 1st & 3rd TOE;  Surgeon: [name], MD;  Location: [place];  Service: Orthopedics;  Laterality: Right;  . EYE SURGERY     Both Eye Lasik   . IRRIGATION AND DEBRIDEMENT FOOT Left 06/29/2019   Procedure: IRRIGATION AND DEBRIDEMENT FOOT application wound vac;  Surgeon: [name], DPM;  Location: [facility];  Service: Podiatry;  Laterality: Left;  . LEFT HEART CATHETERIZATION WITH CORONARY ANGIOGRAM N/A 07/31/2013   Procedure: LEFT HEART CATHETERIZATION WITH CORONARY ANGIOGRAM;  Surgeon: Jettie Booze, MD;  Location: [facility] CATH LAB;  Service: Cardiovascular;  Laterality: N/A;  . spinal tap    . toe amputated      Family History  Problem Relation Age of Onset  . Cancer Mother   . Lung cancer Mother   .  Diabetes Father   . Hypertension Father     Social History Reviewed with no changes to be made today.   Outpatient Medications Prior to Visit  Medication Sig Dispense Refill  . aspirin EC 81 MG tablet Take 1 tablet (81 mg total) by mouth daily. 30 tablet 2  . b complex vitamins tablet Take 1 tablet by mouth daily. 100 tablet 3  . blood glucose meter kit and supplies KIT Dispense based on patient and insurance preference. Use up to four times daily as directed. (FOR ICD-9 250.00, 250.01). 1 each 0  . Blood Pressure Monitor DEVI Please provide patient with insurance approved blood pressure monitor. I10.0 1 each 0  . cetirizine (ZYRTEC) 10 MG tablet Take 1 tablet (10 mg total) by mouth daily. Prn itching 30 tablet 11  . Cholecalciferol (VITAMIN D3) 50 MCG (2000 UT) capsule Take 1 capsule (2,000 Units total) by mouth daily. 100 capsule 2  . clindamycin (CLEOCIN) 300 MG capsule Take 1 capsule (300 mg total) by mouth 2 (two) times daily. 21 capsule  0  . escitalopram (LEXAPRO) 10 MG tablet Take 1 tablet (10 mg total) by mouth at bedtime. 90 tablet 1  . ferrous sulfate 325 (65 FE) MG tablet Take 1 tablet (325 mg total) by mouth daily. 90 tablet 1  . gabapentin (NEURONTIN) 300 MG capsule Take 1 capsule (300 mg total) by mouth 3 (three) times daily. Patient needs office visit before refills will be given 270 capsule 3  . insulin glargine (LANTUS SOLOSTAR) 100 UNIT/ML Solostar Pen Inject 15 Units into the skin daily. 15 mL 11  . Insulin Pen Needle 32G X 4 MM MISC 1 each by Does not apply route daily. 30 each 1  . metFORMIN (GLUCOPHAGE) 500 MG tablet Take 1 tablet (500 mg total) by mouth 2 (two) times daily with a meal. 180 tablet 3  . Accu-Chek FastClix Lancets MISC 1 Package by Does not apply route as directed. Use as instructed to test blood sugar 2 times daily E11.65 100 each 12  . carvedilol (COREG) 25 MG tablet Take 1 tablet (25 mg total) by mouth 2 (two) times daily. 180 tablet 1  . glucose blood  test strip Use as instructed to test blood sugar 2 times daily E11.65 100 each 12  . cloNIDine (CATAPRES) 0.1 MG tablet Take 1 tablet (0.1 mg total) by mouth 2 (two) times daily. (Patient not taking: Reported on 10/19/2019) 60 tablet 11  . silver sulfADIAZINE (SILVADENE) 1 % cream Apply 1 application topically daily. (Patient not taking: Reported on 10/19/2019) 50 g 1  . amLODipine (NORVASC) 10 MG tablet Take 1 tablet (10 mg total) by mouth daily. (Patient not taking: Reported on 10/19/2019) 90 tablet 3  . atorvastatin (LIPITOR) 20 MG tablet Take 1 tablet (20 mg total) by mouth daily. (Patient not taking: Reported on 10/19/2019) 90 tablet 1  . senna-docusate (SENOKOT-S) 8.6-50 MG tablet Take 2 tablets by mouth daily. (Patient not taking: Reported on 10/19/2019) 100 tablet 6   No facility-administered medications prior to visit.    Allergies  Allergen Reactions  . Bee Venom Swelling    SWELLING REACTION UNSPECIFIED   . Penicillins Hives and Other (See Comments)    PATIENT HAS HAD A PCN REACTION WITH IMMEDIATE RASH, FACIAL/TONGUE/THROAT SWELLING, SOB, OR LIGHTHEADEDNESS WITH HYPOTENSION:  **Can tolerate cephalosporins**  . Clonidine Derivatives     Pt states "It is making me dizzy"       Objective:    BP (!) 197/117 (BP Location: Right Arm, Patient Position: Sitting, Cuff Size: Normal)   Pulse 92   Temp 97.7 F (36.5 C) (Temporal)   Ht 5' 5.5" (1.664 m)   Wt 200 lb (90.7 kg)   SpO2 100%   BMI 32.78 kg/m  Wt Readings from Last 3 Encounters:  11/16/19 200 lb (90.7 kg)  10/19/19 200 lb (90.7 kg)  09/16/19 198 lb 6.4 oz (90 kg)    Physical Exam Vitals and nursing note reviewed.  Constitutional:      Appearance: He is well-developed.  HENT:     Head: Normocephalic and atraumatic.  Cardiovascular:     Rate and Rhythm: Normal rate and regular rhythm.     Heart sounds: Normal heart sounds. No murmur heard.  No friction rub. No gallop.   Pulmonary:     Effort: Pulmonary effort is  normal. No tachypnea or respiratory distress.     Breath sounds: Normal breath sounds. No decreased breath sounds, wheezing, rhonchi or rales.  Chest:     Chest wall: No tenderness.  Abdominal:     General: Bowel sounds are normal.     Palpations: Abdomen is soft.  Musculoskeletal:        General: Normal range of motion.     Cervical back: Normal range of motion.  Skin:    General: Skin is warm and dry.     Findings: Rash present. Rash is papular. Rash is not crusting, pustular or vesicular.  Neurological:     Mental Status: He is alert and oriented to person, place, and time.     Coordination: Coordination normal.  Psychiatric:        Behavior: Behavior normal. Behavior is cooperative.        Thought Content: Thought content normal.        Judgment: Judgment normal.          Patient has been counseled extensively about nutrition and exercise as well as the importance of adherence with medications and regular follow-up. The patient was given clear instructions to go to ER or return to medical center if symptoms don't improve, worsen or new problems develop. The patient verbalized understanding.   Follow-up: Return in about 3 weeks (around 12/07/2019) for BP CHECK WITH LUKE/CMP. See me in 6 weeks for BP recheck.   Gildardo Pounds, FNP-BC [facility] and [facility] [place], [place]   11/17/2019, 11:43 PM

## 2019-11-17 ENCOUNTER — Encounter: Payer: Self-pay | Admitting: Nurse Practitioner

## 2019-11-21 ENCOUNTER — Encounter: Payer: Medicaid Other | Admitting: Nutrition

## 2019-11-26 ENCOUNTER — Other Ambulatory Visit: Payer: Self-pay

## 2019-11-26 ENCOUNTER — Ambulatory Visit (AMBULATORY_SURGERY_CENTER): Payer: Self-pay

## 2019-11-26 VITALS — Ht 64.0 in | Wt 197.6 lb

## 2019-11-26 DIAGNOSIS — Z1211 Encounter for screening for malignant neoplasm of colon: Secondary | ICD-10-CM

## 2019-11-26 MED ORDER — NA SULFATE-K SULFATE-MG SULF 17.5-3.13-1.6 GM/177ML PO SOLN: 1.0000 | Freq: Once | ORAL | 0 refills | Status: AC

## 2019-11-26 NOTE — Progress Notes (Signed)
No egg or soy allergy known to patient  No issues with past sedation with any surgeries or procedures No intubation problems in the past  No FH of Malignant Hyperthermia No diet pills per patient No home 02 use per patient  No blood thinners per patient  Pt reports issues with constipation - instructed to start Miralax twice daily x 5 days prior to prep due to constipation (patient reports 1 BM per 3-4 days); No A fib or A flutter  EMMI video via [place] 19 guidelines implemented in PV today with Pt and RN  COVID vaccines completed on 06/2019 per pt;  Due to the COVID-19 pandemic we are asking patients to follow these guidelines. Please only bring one care partner. Please be aware that your care partner may wait in the car in the parking lot or if they feel like they will be too hot to wait in the car, they may wait in the lobby on the 4th floor. All care partners are required to wear a mask the entire time (we do not have any that we can provide them), they need to practice social distancing, and we will do a Covid check for all patient's and care partners when you arrive. Also we will check their temperature and your temperature. If the care partner waits in their car they need to stay in the parking lot the entire time and we will call them on their cell phone when the patient is ready for discharge so they can bring the car to the front of the building. Also all patient's will need to wear a mask into building.

## 2019-11-27 ENCOUNTER — Ambulatory Visit (INDEPENDENT_AMBULATORY_CARE_PROVIDER_SITE_OTHER): Payer: Medicaid Other | Admitting: Podiatry

## 2019-11-27 DIAGNOSIS — L97522 Non-pressure chronic ulcer of other part of left foot with fat layer exposed: Secondary | ICD-10-CM | POA: Diagnosis not present

## 2019-11-27 DIAGNOSIS — L97511 Non-pressure chronic ulcer of other part of right foot limited to breakdown of skin: Secondary | ICD-10-CM | POA: Diagnosis not present

## 2019-11-27 NOTE — Progress Notes (Signed)
  Subjective:  Patient ID: [name], male    DOB: 1970/06/24,  MRN: [id number]  Chief Complaint  Patient presents with  . Wound Check    Pt states no concerns and healing well   49 y.o. male presents for wound care. Hx confirmed with patient.  Thinks the wounds are doing much better. Objective:  Physical Exam: Wound Location: left midfoot Wound Measurement: epithelialized Wound Base: hyperkeratotic Peri-wound: hyperkeratotic Exudate: n/a wound without warmth, erythema, signs of acute infection  Wound Location: right submet 2/3 Wound Measurement: epithelialized post debridement Wound Base: hyperatotic, fibrotic Peri-wound: Hyperkeratotic wound without warmth, erythema, signs of acute infection  Assessment:   1. Ulcer of left foot with fat layer exposed ([place])   2. Ulcerated, foot, right, limited to breakdown of skin [facility])    Plan:  Patient was evaluated and treated and all questions answered.  Ulcers bilateral feet -Right foot debrided as below -Rx for DM shoes and toe filler left. Would consider brace if left foot insert does not alleviate pressure sufficiently. -Dressed with silvadene and DSD.  Procedure: Selective Debridement of Wound Rationale: Removal of devitalized tissue from the wound to promote healing.  Pre-Debridement Wound Measurements: 0.5x0.5  Post-Debridement Wound Measurements: same as pre-debridement. Type of Debridement: sharp selective Tissue Removed: Devitalized soft-tissue Dressing: Dry, sterile, compression dressing. Disposition: Patient tolerated procedure well. Patient to return in 1 week for follow-up.    No follow-ups on file.

## 2019-12-07 ENCOUNTER — Ambulatory Visit: Payer: Medicaid Other | Attending: Family Medicine | Admitting: Pharmacist

## 2019-12-07 ENCOUNTER — Encounter: Payer: Self-pay | Admitting: Pharmacist

## 2019-12-07 ENCOUNTER — Other Ambulatory Visit: Payer: Self-pay

## 2019-12-07 DIAGNOSIS — E1142 Type 2 diabetes mellitus with diabetic polyneuropathy: Secondary | ICD-10-CM

## 2019-12-07 DIAGNOSIS — I10 Essential (primary) hypertension: Secondary | ICD-10-CM

## 2019-12-07 DIAGNOSIS — Z794 Long term (current) use of insulin: Secondary | ICD-10-CM

## 2019-12-07 NOTE — Progress Notes (Signed)
   S:    PCP: Zelda   Patient arrives in good spirits.  Presents to the clinic for hypertension evaluation, counseling, and management. Patient was referred and last seen by Primary Care Provider on 11/16/2019.   Pt denies HA, blurred vision, dizziness. Denies chest pains or shortness of breath. He reports that is trying to better with his medication adherence overall. He is taking his clonidine and lisinopril. However, he is not taking carvedilol.   Medication adherence denied. Current BP Medications include:  Carvedilol 25 mg BID (not taking), clonidine 0.1 mg BID, lisinopril 5 mg    Dietary habits include: not compliant with salt restriction but is interesting in using salt substitutes; drinks green tea throughout the day  Exercise habits include: unable to exercise fully d/t toe amputations; does plan to increase activity level on his bike  Family / Social history:  - FHx: DM, HTN - Tobacco: current every day smoker - Alcohol: none currently    O:  Today's Vitals   12/07/19 1030  BP: (!) 156/95  Pulse: 93   Home BP readings: has cuff at home but cannot recall readings   Last 3 Office BP readings: BP Readings from Last 3 Encounters:  11/16/19 (!) 197/117  10/19/19 (!) 168/98  07/19/19 (!) 166/92    BMET    Component Value Date/Time   NA 139 10/10/2019 0958   K 4.6 10/10/2019 0958   CL 106 10/10/2019 0958   CO2 21 10/10/2019 0958   GLUCOSE 202 (H) 10/10/2019 0958   GLUCOSE 92 07/02/2019 0542   BUN 21 10/10/2019 0958   CREATININE 2.20 (H) 10/10/2019 0958   CALCIUM 9.4 10/10/2019 0958   GFRNONAA 34 (L) 10/10/2019 0958   GFRAA 39 (L) 10/10/2019 0958    Renal function: CrCl cannot be calculated (Patient's most recent lab result is older than the maximum 21 days allowed.).  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 BP Goal = < 130/80 mmHg. Pt is not fully adherent. This is a concern, especially regarding pt's BB and clonidine. I had a discussion with him regarding importance of medication adherence. Pt verbalizes plan to improve this.   -Continued current regimen.  -Counseled on lifestyle modifications for blood pressure control including reduced dietary sodium, increased exercise, adequate sleep.  Results reviewed and written information provided.   Total time in face-to-face counseling 15 minutes.   F/U Clinic Visit in 2 weeks.   Benard Halsted, PharmD, [place] [phone]

## 2019-12-08 ENCOUNTER — Other Ambulatory Visit: Payer: Self-pay | Admitting: Nurse Practitioner

## 2019-12-08 DIAGNOSIS — N1832 Chronic kidney disease, stage 3b: Secondary | ICD-10-CM

## 2019-12-08 LAB — CMP14+EGFR
ALT: 9 IU/L (ref 0–44)
AST: 9 IU/L (ref 0–40)
Albumin/Globulin Ratio: 1.1 — ABNORMAL LOW (ref 1.2–2.2)
Albumin: 3.8 g/dL — ABNORMAL LOW (ref 4.0–5.0)
Alkaline Phosphatase: 108 IU/L (ref 48–121)
BUN/Creatinine Ratio: 9 (ref 9–20)
BUN: 20 mg/dL (ref 6–24)
Bilirubin Total: 0.3 mg/dL (ref 0.0–1.2)
CO2: 25 mmol/L (ref 20–29)
Calcium: 9.2 mg/dL (ref 8.7–10.2)
Chloride: 102 mmol/L (ref 96–106)
Creatinine, Ser: 2.16 mg/dL — ABNORMAL HIGH (ref 0.76–1.27)
GFR calc Af Amer: 40 mL/min/{1.73_m2} — ABNORMAL LOW (ref >59)
GFR calc non Af Amer: 34 mL/min/{1.73_m2} — ABNORMAL LOW (ref >59)
Globulin, Total: 3.6 g/dL (ref 1.5–4.5)
Glucose: 147 mg/dL — ABNORMAL HIGH (ref 65–99)
Potassium: 4.6 mmol/L (ref 3.5–5.2)
Sodium: 138 mmol/L (ref 134–144)
Total Protein: 7.4 g/dL (ref 6.0–8.5)

## 2019-12-10 ENCOUNTER — Other Ambulatory Visit: Payer: Self-pay

## 2019-12-10 ENCOUNTER — Encounter: Payer: Self-pay | Admitting: Gastroenterology

## 2019-12-10 ENCOUNTER — Ambulatory Visit (AMBULATORY_SURGERY_CENTER): Payer: Medicaid Other | Admitting: Gastroenterology

## 2019-12-10 VITALS — BP 158/92 | HR 89 | Temp 97.3°F | Resp 18 | Ht 64.0 in | Wt 197.6 lb

## 2019-12-10 DIAGNOSIS — D122 Benign neoplasm of ascending colon: Secondary | ICD-10-CM

## 2019-12-10 DIAGNOSIS — Z1211 Encounter for screening for malignant neoplasm of colon: Secondary | ICD-10-CM | POA: Diagnosis not present

## 2019-12-10 DIAGNOSIS — D125 Benign neoplasm of sigmoid colon: Secondary | ICD-10-CM | POA: Diagnosis not present

## 2019-12-10 DIAGNOSIS — D123 Benign neoplasm of transverse colon: Secondary | ICD-10-CM

## 2019-12-10 MED ORDER — SODIUM CHLORIDE 0.9 % IV SOLN: 500.0000 mL | Freq: Once | INTRAVENOUS | Status: DC

## 2019-12-10 NOTE — Progress Notes (Signed)
Called to room to assist during endoscopic procedure.  Patient ID and intended procedure confirmed with present staff. Received instructions for my participation in the procedure from the performing physician.  

## 2019-12-10 NOTE — Op Note (Signed)
[place] Patient Name: [name] Procedure Date: 12/10/2019 8:49 AM MRN: [id number] Endoscopist: Remo Lipps P. Havery Moros , MD Age: 49 Referring MD:  Date of Birth: 11/10/1970 Gender: Male Account #: [id number] Procedure:                Colonoscopy Indications:              Screening for colorectal malignant neoplasm, This                            is the patient's first colonoscopy Medicines:                Monitored Anesthesia Care Procedure:                Pre-Anesthesia Assessment:                           - Prior to the procedure, a History and Physical                            was performed, and patient medications and                            allergies were reviewed. The patient's tolerance of                            previous anesthesia was also reviewed. The risks                            and benefits of the procedure and the sedation                            options and risks were discussed with the patient.                            All questions were answered, and informed consent                            was obtained. Prior Anticoagulants: The patient has                            taken no previous anticoagulant or antiplatelet                            agents. ASA Grade Assessment: III - A patient with                            severe systemic disease. After reviewing the risks                            and benefits, the patient was deemed in                            satisfactory condition to undergo the procedure.  After obtaining informed consent, the colonoscope                            was passed under direct vision. Throughout the                            procedure, the patient's blood pressure, pulse, and                            oxygen saturations were monitored continuously. The                            Colonoscope was introduced through the anus and                            advanced to the the  cecum, identified by                            appendiceal orifice and ileocecal valve. The                            colonoscopy was performed without difficulty. The                            patient tolerated the procedure well. The quality                            of the bowel preparation was good. The ileocecal                            valve, appendiceal orifice, and rectum were                            photographed. Scope In: 9:04:13 AM Scope Out: 9:26:10 AM Scope Withdrawal Time: 0 hours 18 minutes 22 seconds  Total Procedure Duration: 0 hours 21 minutes 57 seconds  Findings:                 The perianal and digital rectal examinations were                            normal.                           A 5 mm polyp was found in the ascending colon. The                            polyp was sessile. The polyp was removed with a                            cold snare. Resection and retrieval were complete.                           Two sessile polyps were found in the transverse  colon. The polyps were 3 mm in size. These polyps                            were removed with a cold snare. Resection and                            retrieval were complete.                           A 4-5 mm polyp was found in the sigmoid colon. The                            polyp was sessile. The polyp was removed with a                            cold snare. Resection and retrieval were complete.                           Scattered small-mouthed diverticula were found in                            the transverse colon and left colon.                           Internal hemorrhoids were found during retroflexion.                           The exam was otherwise without abnormality. Complications:            No immediate complications. Estimated blood loss:                            Minimal. Estimated Blood Loss:     Estimated blood loss was minimal. Impression:                - One 5 mm polyp in the ascending colon, removed                            with a cold snare. Resected and retrieved.                           - Two 3 mm polyps in the transverse colon, removed                            with a cold snare. Resected and retrieved.                           - One 4 mm polyp in the sigmoid colon, removed with                            a cold snare. Resected and retrieved.                           - Diverticulosis in the transverse  colon and in the                            left colon.                           - Internal hemorrhoids.                           - The examination was otherwise normal. Recommendation:           - Patient has a contact number available for                            emergencies. The signs and symptoms of potential                            delayed complications were discussed with the                            patient. Return to normal activities tomorrow.                            Written discharge instructions were provided to the                            patient.                           - Resume previous diet.                           - Continue present medications.                           - Await pathology results. Remo Lipps P. Rasheda Ledger, MD 12/10/2019 9:30:10 AM This report has been signed electronically.

## 2019-12-10 NOTE — Progress Notes (Signed)
Pt's states no medical or surgical changes since previsit or office visit. 

## 2019-12-10 NOTE — Progress Notes (Signed)
Report to PACU, RN, vss, BBS= Clear.  

## 2019-12-10 NOTE — Patient Instructions (Signed)
Please read handouts provided. Continue present medications. Await pathology results.   YOU HAD AN ENDOSCOPIC PROCEDURE TODAY AT THE [facility]:   Refer to the procedure report that was given to you for any specific questions about what was found during the examination.  If the procedure report does not answer your questions, please call your gastroenterologist to clarify.  If you requested that your care partner not be given the details of your procedure findings, then the procedure report has been included in a sealed envelope for you to review at your convenience later.  YOU SHOULD EXPECT: Some feelings of bloating in the abdomen. Passage of more gas than usual.  Walking can help get rid of the air that was put into your GI tract during the procedure and reduce the bloating. If you had a lower endoscopy (such as a colonoscopy or flexible sigmoidoscopy) you may notice spotting of blood in your stool or on the toilet paper. If you underwent a bowel prep for your procedure, you may not have a normal bowel movement for a few days.  Please Note:  You might notice some irritation and congestion in your nose or some drainage.  This is from the oxygen used during your procedure.  There is no need for concern and it should clear up in a day or so.  SYMPTOMS TO REPORT IMMEDIATELY:  Following lower endoscopy (colonoscopy or flexible sigmoidoscopy):  Excessive amounts of blood in the stool  Significant tenderness or worsening of abdominal pains  Swelling of the abdomen that is new, acute  Fever of 100F or higher   For urgent or emergent issues, a gastroenterologist can be reached at any hour by calling [phone]. Do not use MyChart messaging for urgent concerns.    DIET:  We do recommend a small meal at first, but then you may proceed to your regular diet.  Drink plenty of fluids but you should avoid alcoholic beverages for 24 hours.  ACTIVITY:  You should plan to take it easy  for the rest of today and you should NOT DRIVE or use heavy machinery until tomorrow (because of the sedation medicines used during the test).    FOLLOW UP: Our staff will call the number listed on your records 48-72 hours following your procedure to check on you and address any questions or concerns that you may have regarding the information given to you following your procedure. If we do not reach you, we will leave a message.  We will attempt to reach you two times.  During this call, we will ask if you have developed any symptoms of COVID 19. If you develop any symptoms (ie: fever, flu-like symptoms, shortness of breath, cough etc.) before then, please call [phone].  If you test positive for Covid 19 in the 2 weeks post procedure, please call and report this information to us.    If any biopsies were taken you will be contacted by phone or by letter within the next 1-3 weeks.  Please call us at [phone] if you have not heard about the biopsies in 3 weeks.    SIGNATURES/CONFIDENTIALITY: You and/or your care partner have signed paperwork which will be entered into your electronic medical record.  These signatures attest to the fact that that the information above on your After Visit Summary has been reviewed and is understood.  Full responsibility of the confidentiality of this discharge information lies with you and/or your care-partner.  

## 2019-12-12 ENCOUNTER — Telehealth: Payer: Self-pay

## 2019-12-12 NOTE — Telephone Encounter (Signed)
  Follow up Call-  Call back number 12/10/2019  Post procedure Call Back phone  # [phone]  Permission to leave phone message Yes  Some recent data might be hidden     Patient questions:  Do you have a fever, pain , or abdominal swelling? No. Pain Score  0 *  Have you tolerated food without any problems? Yes.    Have you been able to return to your normal activities? Yes.    Do you have any questions about your discharge instructions: Diet   No. Medications  No. Follow up visit  No.  Do you have questions or concerns about your Care? No.  Actions: * If pain score is 4 or above: No action needed, pain <4.  1. Have you developed a fever since your procedure? no  2.   Have you had an respiratory symptoms (SOB or cough) since your procedure? no  3.   Have you tested positive for COVID 19 since your procedure no  4.   Have you had any family members/close contacts diagnosed with the COVID 19 since your procedure?  no   If yes to any of these questions please route to Joylene John, RN and Joella Prince, RN

## 2019-12-21 ENCOUNTER — Ambulatory Visit: Payer: Medicaid Other | Attending: Nurse Practitioner | Admitting: Pharmacist

## 2019-12-21 ENCOUNTER — Encounter: Payer: Self-pay | Admitting: Pharmacist

## 2019-12-21 ENCOUNTER — Other Ambulatory Visit: Payer: Self-pay

## 2019-12-21 VITALS — BP 153/96 | HR 95

## 2019-12-21 DIAGNOSIS — I10 Essential (primary) hypertension: Secondary | ICD-10-CM

## 2019-12-21 NOTE — Patient Instructions (Addendum)
Dermatology appointment with [facility] scheduled for 03/12/2020 @ 11:15 AM. Address: [address], [place]. Phone: [phone].

## 2019-12-21 NOTE — Progress Notes (Signed)
° °  S:    PCP: Zelda   Patient arrives in good spirits.  Presents to the clinic for hypertension evaluation, counseling, and management. Patient was referred and last seen by Primary Care Provider on 11/16/2019. Pharmacy saw him 12/07/19. Pt reported non-compliance with carvedilol at that visit. No medication changes were made.   Today, pt reports doing better with adherence. He has taken his morning doses of carvedilol and clonidine today. Additionally, he has taken his lisinopril and amlodipine this morning.   Pt denies HA, blurred vision, dizziness. Denies chest pains or shortness of breath.   Medication adherence reported. Current BP Medications include:  Amlodipine 10 mg daily, Carvedilol 25 mg BID, clonidine 0.1 mg BID, lisinopril 5 mg   Dietary habits include: now using salt substitutes; drinks green tea throughout the day  Exercise habits include: unable to exercise fully d/t toe amputations; does plan to increase activity level on his bike  Family / Social history:  - FHx: DM, HTN - Tobacco: current every day smoker - Alcohol: none currently    O:  Today's Vitals   12/21/19 1115  BP: (!) 153/96  Pulse: 95     Home BP readings: has cuff at home but cannot recall readings   Last 3 Office BP readings: BP Readings from Last 3 Encounters:  12/21/19 (!) 153/96  12/10/19 (!) 158/92  12/07/19 (!) 156/95   BMET    Component Value Date/Time   NA 138 12/07/2019 1030   K 4.6 12/07/2019 1030   CL 102 12/07/2019 1030   CO2 25 12/07/2019 1030   GLUCOSE 147 (H) 12/07/2019 1030   GLUCOSE 92 07/02/2019 0542   BUN 20 12/07/2019 1030   CREATININE 2.16 (H) 12/07/2019 1030   CALCIUM 9.2 12/07/2019 1030   GFRNONAA 34 (L) 12/07/2019 1030   GFRAA 40 (L) 12/07/2019 1030    Renal function: Estimated Creatinine Clearance: 41.3 mL/min (A) (by C-G formula based on SCr of 2.16 mg/dL (H)).  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 BP Goal = < 130/80 mmHg. He reports doing a better job from an adherence standpoint, however, his HR today is in the 90s with carvedilol 25 mg BID. Will hold off on changes today. He has a referral authorized for nephrology. I have provided the number to that clinic so he can call them to set up an appointment.  -Continued current regimen.  -Counseled on lifestyle modifications for blood pressure control including reduced dietary sodium, increased exercise, adequate sleep. -Consider hydralazine in the future. We could titrate lisinopril dose but would have to be cautious given renal function.   Results reviewed and written information provided.   Total time in face-to-face counseling 15 minutes.   F/U Clinic Visit in 2 weeks.   Benard Halsted, PharmD, [place] [phone]

## 2019-12-25 ENCOUNTER — Ambulatory Visit: Payer: Medicaid Other | Admitting: Podiatry

## 2019-12-25 ENCOUNTER — Other Ambulatory Visit: Payer: Self-pay

## 2019-12-25 DIAGNOSIS — L97522 Non-pressure chronic ulcer of other part of left foot with fat layer exposed: Secondary | ICD-10-CM | POA: Diagnosis not present

## 2019-12-25 DIAGNOSIS — L97511 Non-pressure chronic ulcer of other part of right foot limited to breakdown of skin: Secondary | ICD-10-CM

## 2019-12-26 ENCOUNTER — Telehealth: Payer: Self-pay | Admitting: Nurse Practitioner

## 2019-12-26 NOTE — Telephone Encounter (Signed)
No appt available before 01/01/20   Copied from [place] #[id number]. Topic: General - Other >> Dec 25, 2019 11:28 AM [name] wrote: Reason for CRM: pt called in for assistance. Pt says that he is aware that he has an apt with PCP on 10/5, pt would like to know if provider could see him sooner? Pt states that he seen his podiatrist and was told that he need a Rx for diabetic shoes from PCP in order for him to receive his diabetic shoes.    CB: [phone] - please assist pt

## 2019-12-26 NOTE — Progress Notes (Signed)
  Subjective:  Patient ID: [name], male    DOB: 07/07/1970,  MRN: [id number]  Chief Complaint  Patient presents with  . Wound Check    Denies constitutional symptoms. Left plantar midfoot soreness at callous. Pt still waiting on getting his shoes.   49 y.o. male presents for wound care. Hx confirmed with patient. States he has had a lot going on and did not get his Rx filled for his shoes. Objective:  Physical Exam: Wound Location: left midfoot Wound Measurement: epithelialized Wound Base: hyperkeratotic Peri-wound: hyperkeratotic Exudate: n/a wound without warmth, erythema, signs of acute infection  Wound Location: right submet 2/3 Wound Measurement: 1x1.5 Wound Base: hyperatotic, fibrotic Peri-wound: Hyperkeratotic wound without warmth, erythema, signs of acute infection  Assessment:   1. 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 Ulcerated, foot, right, limited to breakdown of skin [facility])    Plan:  Patient was evaluated and treated and all questions answered.  Ulcers bilateral feet -Left foot with significant hyperkeratosis but no open wound -Right with small residual wound -Discussed the importance of compliance with the boot and getting his Diabetic shoes.  Procedure: Selective Debridement of Wound Rationale: Removal of devitalized tissue from the wound to promote healing.  Pre-Debridement Wound Measurements: HPK  Post-Debridement Wound Measurements: 1 cm x 1.5 cm x 0.2 cm Type of Debridement: sharp selective Tissue Removed: Devitalized soft-tissue Dressing: Dry, sterile, compression dressing. Disposition: Patient tolerated procedure well. Patient to return in 1 week for follow-up. \  No follow-ups on file.

## 2019-12-31 ENCOUNTER — Other Ambulatory Visit: Payer: Self-pay | Admitting: Nephrology

## 2019-12-31 DIAGNOSIS — N1832 Chronic kidney disease, stage 3b: Secondary | ICD-10-CM

## 2020-01-01 ENCOUNTER — Encounter: Payer: Self-pay | Admitting: Nurse Practitioner

## 2020-01-01 ENCOUNTER — Other Ambulatory Visit: Payer: Self-pay

## 2020-01-01 ENCOUNTER — Ambulatory Visit: Payer: Medicaid Other | Attending: Nurse Practitioner | Admitting: Nurse Practitioner

## 2020-01-01 ENCOUNTER — Telehealth: Payer: Self-pay

## 2020-01-01 VITALS — BP 181/98 | HR 85 | Temp 97.7°F | Ht 65.5 in | Wt 199.0 lb

## 2020-01-01 DIAGNOSIS — F17200 Nicotine dependence, unspecified, uncomplicated: Secondary | ICD-10-CM | POA: Diagnosis not present

## 2020-01-01 DIAGNOSIS — I25119 Atherosclerotic heart disease of native coronary artery with unspecified angina pectoris: Secondary | ICD-10-CM | POA: Diagnosis not present

## 2020-01-01 DIAGNOSIS — E1142 Type 2 diabetes mellitus with diabetic polyneuropathy: Secondary | ICD-10-CM

## 2020-01-01 DIAGNOSIS — Z794 Long term (current) use of insulin: Secondary | ICD-10-CM | POA: Diagnosis not present

## 2020-01-01 DIAGNOSIS — I10 Essential (primary) hypertension: Secondary | ICD-10-CM | POA: Diagnosis not present

## 2020-01-01 DIAGNOSIS — A4902 Methicillin resistant Staphylococcus aureus infection, unspecified site: Secondary | ICD-10-CM

## 2020-01-01 DIAGNOSIS — L089 Local infection of the skin and subcutaneous tissue, unspecified: Secondary | ICD-10-CM

## 2020-01-01 LAB — GLUCOSE, POCT (MANUAL RESULT ENTRY): POC Glucose: 210 mg/dl — AB (ref 70–99)

## 2020-01-01 MED ORDER — NICOTINE 21 MG/24HR TD PT24: 21.0000 mg | MEDICATED_PATCH | Freq: Every day | TRANSDERMAL | 0 refills | Status: AC

## 2020-01-01 MED ORDER — LISINOPRIL 5 MG PO TABS: 10.0000 mg | ORAL_TABLET | Freq: Every day | ORAL | 3 refills | Status: DC

## 2020-01-01 MED ORDER — SULFAMETHOXAZOLE-TRIMETHOPRIM 800-160 MG PO TABS: 1.0000 | ORAL_TABLET | Freq: Two times a day (BID) | ORAL | 0 refills | Status: DC

## 2020-01-01 MED ORDER — LISINOPRIL 10 MG PO TABS: 10.0000 mg | ORAL_TABLET | Freq: Every day | ORAL | 3 refills | Status: DC

## 2020-01-01 NOTE — Telephone Encounter (Signed)
Patient is in the clinic today for appointment with PCP and is requesting order for diabetic shoe.  Call placed to [facility].    Need to confirm if PCP needs to write the order or if the podiatrist is writing the order.  Also need to confirm where the order should be sent if [employer] is not in network with patient's insurance.  Message left for Dawn with call back requested to this CM

## 2020-01-01 NOTE — Telephone Encounter (Signed)
Pt arrive to appt, 01/01/2020.

## 2020-01-01 NOTE — Progress Notes (Signed)
Assessment & Plan:  [name] was seen today for blood pressure check.  Diagnoses and all orders for this visit:  Type 2 diabetes mellitus with diabetic polyneuropathy, with long-term current use of insulin ([name]) -     Glucose (CBG)  Tobacco dependence -     nicotine (NICODERM CQ - DOSED IN MG/24 HOURS) 21 mg/24hr patch; Place 1 patch (21 mg total) onto the skin daily. Call the pharmacy 1 week prior to completion for next set of patches 1. [name] continues to smoke 1 pack of cigarettes per day. 2. [name] was counseled on the dangers of tobacco use, and was advised to quit. We reviewed specific strategies to maximize success, including removing cigarettes and smoking materials from environment, stress management and support of family/friends as well as pharmacological alternatives. 3. A total of 5 minutes was spent on counseling for smoking cessation and [name] is ready to quit and has chosen nicotine patches to start today.  4. [name] was offered Wellbutrin, Chantix, Nicotine patch, Nicotine gum or lozenges.  Due to out of pocket costs [name] was also given smoking cessation support and advised to contact: the Smoking Cessation hotline: 1-800-QUIT-NOW.  [name] was also informed of our Smoking cessation classes which are also available through [facility] and [facility] by calling [phone] or visit our website at [email or web address]  5. Will follow up at next scheduled office visit.    Essential hypertension -     ECHOCARDIOGRAM COMPLETE; Future -     lisinopril (ZESTRIL) 10 MG tablet; Take 1 tablet (10 mg total) by mouth daily. Continue all antihypertensives as prescribed.  Remember to bring in your blood pressure log with you for your follow up appointment.  DASH/Mediterranean Diets are healthier choices for HTN.    Atherosclerosis of native coronary artery of native heart with angina pectoris ([place]) -     ECHOCARDIOGRAM COMPLETE; Future  Infection of skin due to  methicillin resistant Staphylococcus aureus (MRSA) -     sulfamethoxazole-trimethoprim (BACTRIM DS) 800-160 MG tablet; Take 1 tablet by mouth 2 (two) times daily for 7 days.      Patient has been counseled on age-appropriate routine health concerns for screening and prevention. These are reviewed and up-to-date. Referrals have been placed accordingly. Immunizations are up-to-date or declined.    Subjective:   Chief Complaint  Patient presents with  . Blood Pressure Check    Pt. is here for BP check. Pt. stated he's been breaking out on his arms and chest.    HPI [name] 49 y.o. male presents to office today for HTN.  has a past medical history of Allergy, Anemia, Diabetes mellitus type II, uncontrolled ([place]), Glaucoma, Hyperlipidemia, Hypertension, Other and unspecified angina pectoris, and Tobacco use.   Last EKG abnormal with possible atrial enlargement. Will order ECHO. May need cardiology referral.   Essential Hypertension Poorly controlled. He is still smoking. Increasing lisinoprol from 79m to 10 mg today. He will continue on clonidine 0.1 mg BID, carvedilol 25 mg BID and amlodipine 10 mg daily. Denies chest pain, palpitations, lightheadedness, dizziness, headaches or BLE edema.  BP Readings from Last 3 Encounters:  01/01/20 (!) 181/98  12/21/19 (!) 153/96  12/10/19 (!) 158/92    DM TYPE  Seeing endocrinology Dr. SKelton Pillar Does not have his meter today.  Taking metformin 5060mBID, lantus 15 unit daily.  Lab Results  Component Value Date   HGBA1C 8.7 (A) 10/19/2019   SKIN infection Onset a few months ago. Associated symptoms:  Itching and irritation.         Review of Systems  Constitutional: Negative for fever, malaise/fatigue and weight loss.  HENT: Negative.  Negative for nosebleeds.   Eyes: Negative.  Negative for blurred vision, double vision and photophobia.  Respiratory: Negative.  Negative for cough and shortness of breath.   Cardiovascular:  Negative.  Negative for chest pain, palpitations and leg swelling.  Gastrointestinal: Negative.  Negative for heartburn, nausea and vomiting.  Musculoskeletal: Negative.  Negative for myalgias.  Skin: Positive for itching and rash.       SEE HPI  Neurological: Negative.  Negative for dizziness, focal weakness, seizures and headaches.  Psychiatric/Behavioral: Negative.  Negative for suicidal ideas.    Past Medical History:  Diagnosis Date  . Allergy    seasonal allergies  . Anemia    on meds  . Diabetes mellitus type II, uncontrolled ([place])    on meds  . Glaucoma    right   . Hyperlipidemia    on meds  . Hypertension    on meds  . Other and unspecified angina pectoris   . Tobacco use     Past Surgical History:  Procedure Laterality Date  . AMPUTATION Left 08/22/2016   Procedure: GREAT TOE AMPUTATION;  Surgeon: [name], MD;  Location: [place] [facility];  Service: Orthopedics;  Laterality: Left;  . AMPUTATION Right 05/28/2017   Procedure: AMPUTATION 1st & 3rd TOE;  Surgeon: [name], MD;  Location: [facility];  Service: Orthopedics;  Laterality: Right;  . EYE SURGERY     Both Eye Lasik   . IRRIGATION AND DEBRIDEMENT FOOT Left 06/29/2019   Procedure: IRRIGATION AND DEBRIDEMENT FOOT application wound vac;  Surgeon: [name], DPM;  Location: [facility];  Service: Podiatry;  Laterality: Left;  . LEFT HEART CATHETERIZATION WITH CORONARY ANGIOGRAM N/A 07/31/2013   Procedure: LEFT HEART CATHETERIZATION WITH CORONARY ANGIOGRAM;  Surgeon: Jettie Booze, MD;  Location[facility] C[facility] LAB;  Service: Cardiovascular;  Laterality: N/A;  . spinal tap    . toe amputated      Family History  Problem Relation Age of Onset  . Cancer Mother   . Lung cancer Mother 29  . Diabetes Father   . Hypertension Father   . Colon cancer Neg Hx   . Colon polyps Neg Hx   . Esophageal cancer Neg Hx   . Rectal cancer Neg Hx   . Stomach cancer Neg Hx     Social History Reviewed with no changes to be made  today.   Outpatient Medications Prior to Visit  Medication Sig Dispense Refill  . Accu-Chek FastClix Lancets MISC 1 Package by Does not apply route as directed. Use as instructed to test blood sugar 2 times daily E11.65 100 each 12  . amLODipine (NORVASC) 10 MG tablet Take 1 tablet (10 mg total) by mouth daily. 90 tablet 3  . aspirin EC 81 MG tablet Take 1 tablet (81 mg total) by mouth daily. 30 tablet 2  . atorvastatin (LIPITOR) 20 MG tablet Take 1 tablet (20 mg total) by mouth daily. 90 tablet 1  . blood glucose meter kit and supplies KIT Dispense based on patient and insurance preference. Use up to four times daily as directed. (FOR ICD-9 250.00, 250.01). 1 each 0  . Blood Pressure Monitor DEVI Please provide patient with insurance approved blood pressure monitor. I10.0 1 each 0  . carvedilol (COREG) 25 MG tablet Take 1 tablet (25 mg total) by mouth 2 (  two) times daily. 180 tablet 1  . cetirizine (ZYRTEC) 10 MG tablet Take 1 tablet (10 mg total) by mouth daily. Prn itching 30 tablet 11  . Cholecalciferol (VITAMIN D3) 50 MCG (2000 UT) capsule Take 1 capsule (2,000 Units total) by mouth daily. 100 capsule 2  . cloNIDine (CATAPRES) 0.1 MG tablet Take 1 tablet (0.1 mg total) by mouth 2 (two) times daily. 60 tablet 11  . escitalopram (LEXAPRO) 10 MG tablet Take 1 tablet (10 mg total) by mouth at bedtime. 90 tablet 1  . ferrous sulfate 325 (65 FE) MG tablet Take 1 tablet (325 mg total) by mouth daily. 90 tablet 1  . gabapentin (NEURONTIN) 300 MG capsule Take 1 capsule (300 mg total) by mouth 3 (three) times daily. Patient needs office visit before refills will be given 270 capsule 3  . glucose blood test strip Use as instructed to test blood sugar 2 times daily E11.65 100 each 12  . insulin glargine (LANTUS SOLOSTAR) 100 UNIT/ML Solostar Pen Inject 15 Units into the skin daily. 15 mL 11  . Insulin Pen Needle 32G X 4 MM MISC 1 each by Does not apply route daily. 30 each 1  . metFORMIN (GLUCOPHAGE)  500 MG tablet Take 1 tablet (500 mg total) by mouth 2 (two) times daily with a meal. 180 tablet 3  . lisinopril (ZESTRIL) 5 MG tablet Take 1 tablet (5 mg total) by mouth daily. 90 tablet 3  . b complex vitamins tablet Take 1 tablet by mouth daily. (Patient not taking: Reported on 01/01/2020) 100 tablet 3   No facility-administered medications prior to visit.    Allergies  Allergen Reactions  . Bee Venom Swelling    SWELLING REACTION UNSPECIFIED   . Penicillins Hives and Other (See Comments)    PATIENT HAS HAD A PCN REACTION WITH IMMEDIATE RASH, FACIAL/TONGUE/THROAT SWELLING, SOB, OR LIGHTHEADEDNESS WITH HYPOTENSION:  **Can tolerate cephalosporins**  . Clonidine Derivatives     Pt states "It is making me dizzy"       Objective:    BP (!) 181/98 (BP Location: Left Arm, Patient Position: Sitting, Cuff Size: Normal)   Pulse 85   Temp 97.7 F (36.5 C) (Temporal)   Ht 5' 5.5" (1.664 m)   Wt 199 lb (90.3 kg)   SpO2 100%   BMI 32.61 kg/m  Wt Readings from Last 3 Encounters:  01/01/20 199 lb (90.3 kg)  12/10/19 197 lb 9.6 oz (89.6 kg)  11/26/19 197 lb 9.6 oz (89.6 kg)    Physical Exam Vitals and nursing note reviewed.  Constitutional:      Appearance: He is well-developed.  HENT:     Head: Normocephalic and atraumatic.  Cardiovascular:     Rate and Rhythm: Normal rate and regular rhythm.     Heart sounds: Normal heart sounds. No murmur heard.  No friction rub. No gallop.   Pulmonary:     Effort: Pulmonary effort is normal. No tachypnea or respiratory distress.     Breath sounds: Normal breath sounds. No decreased breath sounds, wheezing, rhonchi or rales.  Chest:     Chest wall: No tenderness.  Abdominal:     General: Bowel sounds are normal.     Palpations: Abdomen is soft.  Musculoskeletal:        General: Normal range of motion.     Cervical back: Normal range of motion.  Skin:    General: Skin is warm and dry.     Findings: Rash (see photos) present.  Neurological:     Mental Status: He is alert and oriented to person, place, and time.     Coordination: Coordination normal.  Psychiatric:        Behavior: Behavior normal. Behavior is cooperative.        Thought Content: Thought content normal.        Judgment: Judgment normal.          Patient has been counseled extensively about nutrition and exercise as well as the importance of adherence with medications and regular follow-up. The patient was given clear instructions to go to ER or return to medical center if symptoms don't improve, worsen or new problems develop. The patient verbalized understanding.   Follow-up: Return for BP CHECK WITH LUKE in 2 weeks. BMP needed. Bring meds. See me in 8 weeks.   Gildardo Pounds, FNP-BC [facility] and [place], [place]   01/03/2020, 9:01 AM

## 2020-01-02 ENCOUNTER — Telehealth: Payer: Self-pay | Admitting: Podiatry

## 2020-01-02 ENCOUNTER — Telehealth: Payer: Self-pay

## 2020-01-02 DIAGNOSIS — E08621 Diabetes mellitus due to underlying condition with foot ulcer: Secondary | ICD-10-CM

## 2020-01-02 DIAGNOSIS — L97522 Non-pressure chronic ulcer of other part of left foot with fat layer exposed: Secondary | ICD-10-CM

## 2020-01-02 NOTE — Telephone Encounter (Signed)
I can write it Friday.

## 2020-01-02 NOTE — Telephone Encounter (Signed)
Are you able to place referral for patient for diabetic exam to get diabetic shoes

## 2020-01-02 NOTE — Telephone Encounter (Signed)
Received message yesterday 10.5 from [facility] about pts diabetic shoes.  I returned call and spoke to her and explained that pts insurance does not cover the shoes in our office that he needs a rx from pcp to go to either [facility] ([place])or [facility]([place]).

## 2020-01-02 NOTE — Telephone Encounter (Signed)
Call received from Dawn/[facility]. She explained that medicaid does not accept orders from a podiatrist. They need the order from the PCP.  Dawn stated that the only companies that supply diabetic shoes for medicaid patients are [facility] in [place] and [facility] in [place].  The orders can be faxed to either of those companies.

## 2020-01-02 NOTE — Telephone Encounter (Signed)
Patient called requesting a new prescription for diabetic shoes. He found a place in [place] that will take his insurance. Please provide new Rx for diabetic shoes.   (if you want me to write it let me know and I will have him come by and pick it up).

## 2020-01-02 NOTE — Telephone Encounter (Signed)
New message    The patient is asking for a referral to get his foot exam for diabetic shoes   [facility] in [place] due to insurance will cover the company will not take the referral from his Foot doctor only from his PCP or Endocrinology.    Phone # [phone]

## 2020-01-03 ENCOUNTER — Encounter: Payer: Self-pay | Admitting: Nurse Practitioner

## 2020-01-03 MED ORDER — SULFAMETHOXAZOLE-TRIMETHOPRIM 800-160 MG PO TABS: 1.0000 | ORAL_TABLET | Freq: Two times a day (BID) | ORAL | 0 refills | Status: AC

## 2020-01-03 NOTE — Telephone Encounter (Signed)
F/u   The patient voiced the [facility] will not accept the prescription from this food MD only from his PCP or Endocrinology.   Please advise

## 2020-01-04 NOTE — Telephone Encounter (Signed)
Rx written, placed up front for pickup

## 2020-01-04 NOTE — Addendum Note (Signed)
Addended by: Hardie Pulley on: 01/04/2020 07:38 AM   Modules accepted: Orders

## 2020-01-04 NOTE — Telephone Encounter (Signed)
Left message requesting patient have DME supplier send forms to fill out for shoe request.  Per Dr Kelton Pillar [facility] is medical supply place, does he need to go all the way to [place] to get this ? there are local supplies that he can get shoes from. He typically get s prescription from his podiatrist and either me or his PCP co-sign it . I suggest he contac t his podiatrist for an updated exam and prescription. If he doesn't have one, I will be happy to refer him to one

## 2020-01-07 ENCOUNTER — Other Ambulatory Visit: Payer: Self-pay | Admitting: Nurse Practitioner

## 2020-01-07 MED ORDER — MISC. DEVICES MISC: 0 refills | Status: DC

## 2020-01-07 NOTE — Telephone Encounter (Signed)
Spoke with patient and he didn't have time to contact the DME company just yet. He will try to contact them today

## 2020-01-07 NOTE — Telephone Encounter (Signed)
Prescription has been placed for diabetic shoes

## 2020-01-08 ENCOUNTER — Ambulatory Visit
Admission: RE | Admit: 2020-01-08 | Discharge: 2020-01-08 | Disposition: A | Discharge status: Home / Self Care | Payer: Medicaid Other | Source: Ambulatory Visit | Attending: Nephrology | Admitting: Nephrology

## 2020-01-08 DIAGNOSIS — N1832 Chronic kidney disease, stage 3b: Secondary | ICD-10-CM

## 2020-01-14 MED ORDER — MISC. DEVICES MISC: 0 refills | Status: AC

## 2020-01-14 NOTE — Addendum Note (Signed)
Addended byMariane Baumgarten on: 01/14/2020 02:32 PM   Modules accepted: Orders

## 2020-01-14 NOTE — Telephone Encounter (Signed)
CMA faxed it to [facility].

## 2020-01-15 ENCOUNTER — Ambulatory Visit: Payer: Medicaid Other | Admitting: Pharmacist

## 2020-01-22 ENCOUNTER — Other Ambulatory Visit: Payer: Self-pay

## 2020-01-22 ENCOUNTER — Encounter: Payer: Self-pay | Admitting: Pharmacist

## 2020-01-22 ENCOUNTER — Ambulatory Visit: Payer: Medicaid Other | Attending: Nurse Practitioner | Admitting: Pharmacist

## 2020-01-22 ENCOUNTER — Ambulatory Visit (HOSPITAL_COMMUNITY)
Admission: RE | Admit: 2020-01-22 | Discharge: 2020-01-22 | Disposition: A | Discharge status: Home / Self Care | Payer: Medicaid Other | Source: Ambulatory Visit | Attending: Nurse Practitioner | Admitting: Nurse Practitioner

## 2020-01-22 VITALS — BP 113/79 | HR 88

## 2020-01-22 DIAGNOSIS — I10 Essential (primary) hypertension: Secondary | ICD-10-CM | POA: Insufficient documentation

## 2020-01-22 DIAGNOSIS — E119 Type 2 diabetes mellitus without complications: Secondary | ICD-10-CM | POA: Insufficient documentation

## 2020-01-22 DIAGNOSIS — E785 Hyperlipidemia, unspecified: Secondary | ICD-10-CM | POA: Insufficient documentation

## 2020-01-22 DIAGNOSIS — I25119 Atherosclerotic heart disease of native coronary artery with unspecified angina pectoris: Secondary | ICD-10-CM | POA: Diagnosis present

## 2020-01-22 NOTE — Progress Notes (Signed)
Echocardiogram 2D Echocardiogram has been performed.  Oneal Deputy Annagrace Carr 01/22/2020, 2:03 PM

## 2020-01-22 NOTE — Patient Instructions (Signed)
Morning Medications Evening Medications   Carvedilol: take one 25mg  tablet Carvedilol: take one 25mg  tablet  Clonidine: take one 0.1 mg tablet  Clonidine: take one 0.1 mg tablet  Lisinopril: take one 10 mg tablet   Amlodipine: take one 10 mg tablet

## 2020-01-22 NOTE — Progress Notes (Signed)
S:    PCP: Zelda   Patient arrives in good spirits.  Presents to the clinic for hypertension evaluation, counseling, and management. Patient was referred by Archie Patten. Last seen by Primary Care Provider on 01/21/20. Pharmacy saw him 12/21/2019. Patient has history of medication non-compliance. Lisinopril with increased to 10 mg daily last visit.  Today, patient presents with medication bottles in hand, which represented some medication adherence issues. Contacted the patient's pharmacy to confirm fill history, which supported non-compliance as well.   Pt denies HA, blurred vision. Denies chest pains or shortness of breath. Reports occasional dizziness after taking clonidine.   After interviewing the patient, there was some confusion with medication directions (see below). Of note, he reports taking all four blood pressure medications this morning. Current BP Medications include:    Amlodipine 10 mg daily   Carvedilol 25 mg BID (actually only taking once daily)  Clonidine 0.1 mg BID (reports missed doses)  Lisinopril 10 mg (actually taking two 5 mg tablets BID)  Dietary habits include: now using salt substitutes; drinks green tea throughout the day  Exercise habits include: unable to exercise fully d/t toe amputations; does plan to increase activity level on his bike  Family / Social history:  - FHx: DM, HTN - Tobacco: current every day smoker - Alcohol: none currently    O:  Today's Vitals   01/22/20 1116  BP: 113/79  Pulse: 88    Home BP readings: has cuff at home but does not use   Last 3 Office BP readings: BP Readings from Last 3 Encounters:  01/22/20 113/79  01/01/20 (!) 181/98  12/21/19 (!) 153/96   BMET    Component Value Date/Time   NA 138 12/07/2019 1030   K 4.6 12/07/2019 1030   CL 102 12/07/2019 1030   CO2 25 12/07/2019 1030   GLUCOSE 147 (H) 12/07/2019 1030   GLUCOSE 92 07/02/2019 0542   BUN 20 12/07/2019 1030   CREATININE 2.16 (H) 12/07/2019  1030   CALCIUM 9.2 12/07/2019 1030   GFRNONAA 34 (L) 12/07/2019 1030   GFRAA 40 (L) 12/07/2019 1030    Renal function: CrCl cannot be calculated (Patient's most recent lab result is older than the maximum 21 days allowed.).  Clinical ASCVD: No ; Coronary angiography in 2015 showed mild nonobstructive CAD in proximal LAD; patient does have a ECHO scheduled for today.  The ASCVD Risk score Mikey Bussing DC Jr., et al., 2013) failed to calculate for the following reasons:   The valid total cholesterol range is 130 to 320 mg/dL   A/P: Hypertension longstanding, potentially controlled (if taking medications) on current regimen. BP Goal = < 130/80 mmHg. Patient has history of non-compliance. He reports taking all four blood pressure medications this morning, resulting in a controlled BP of 113/79 mmHg at today's visit. Will hold off on changes today, and provide motivational interviewing for medication compliance. Patient's kidneys have declined over past year, checking BMP today.  -Continued current regimen, educated on proper use of his prescribed medications. Advised patient to pick up new lisinopril 10 mg prescription from pharmacy to avoid further confusion of taking two 5 mg tables.  -Provided patient with detailed medication chart -Labs today: BMP -Counseled on lifestyle modifications for blood pressure control including reduced dietary sodium, increased exercise, adequate sleep.   Results reviewed and written information provided.   Total time in face-to-face counseling 15 minutes.   F/U PCP visit on 02/26/20  Harriet Pho, PharmD PGY-1 [facility] Pharmacy Resident  01/22/2020 4:04 PM  Benard Halsted, PharmD, [place] [phone]

## 2020-01-23 LAB — ECHOCARDIOGRAM COMPLETE
Area-P 1/2: 2.36 cm2
S' Lateral: 1.8 cm

## 2020-01-23 LAB — BASIC METABOLIC PANEL
BUN/Creatinine Ratio: 9 (ref 9–20)
BUN: 25 mg/dL — ABNORMAL HIGH (ref 6–24)
CO2: 22 mmol/L (ref 20–29)
Calcium: 8.7 mg/dL (ref 8.7–10.2)
Chloride: 102 mmol/L (ref 96–106)
Creatinine, Ser: 2.77 mg/dL — ABNORMAL HIGH (ref 0.76–1.27)
GFR calc Af Amer: 29 mL/min/{1.73_m2} — ABNORMAL LOW (ref >59)
GFR calc non Af Amer: 25 mL/min/{1.73_m2} — ABNORMAL LOW (ref >59)
Glucose: 192 mg/dL — ABNORMAL HIGH (ref 65–99)
Potassium: 4.3 mmol/L (ref 3.5–5.2)
Sodium: 137 mmol/L (ref 134–144)

## 2020-01-25 ENCOUNTER — Ambulatory Visit
Admission: EM | Admit: 2020-01-25 | Discharge: 2020-01-25 | Disposition: A | Discharge status: Home / Self Care | Payer: Medicaid Other | Attending: Physician Assistant | Admitting: Physician Assistant

## 2020-01-25 ENCOUNTER — Encounter: Payer: Self-pay | Admitting: Emergency Medicine

## 2020-01-25 ENCOUNTER — Other Ambulatory Visit: Payer: Self-pay

## 2020-01-25 DIAGNOSIS — L02412 Cutaneous abscess of left axilla: Secondary | ICD-10-CM | POA: Diagnosis not present

## 2020-01-25 MED ORDER — TRAMADOL HCL 50 MG PO TABS: 50.0000 mg | ORAL_TABLET | Freq: Three times a day (TID) | ORAL | 0 refills | Status: DC | PRN

## 2020-01-25 MED ORDER — DOXYCYCLINE HYCLATE 100 MG PO CAPS: 100.0000 mg | ORAL_CAPSULE | Freq: Two times a day (BID) | ORAL | 0 refills | Status: DC

## 2020-01-25 NOTE — ED Provider Notes (Signed)
[name]    CSN: [id number] Arrival date & time: 01/25/20  1126      History   Chief Complaint Chief Complaint  Patient presents with  . Abscess    HPI [name] is a 49 y.o. male.   49 year old male with history of DM, HTN, HLD comes in for 1 week history of left axilla abscess. No obvious erythema, warmth. Denies fever. Topical medicine, warm compress without relief. Fasting CBG~120     Past Medical History:  Diagnosis Date  . Allergy    seasonal allergies  . Anemia    on meds  . Diabetes mellitus type II, uncontrolled ([place])    on meds  . Glaucoma    right   . Hyperlipidemia    on meds  . Hypertension    on meds  . Other and unspecified angina pectoris   . Tobacco use     Patient Active Problem List   Diagnosis Date Noted  . Type 2 diabetes mellitus with diabetic polyneuropathy, with long-term current use of insulin ([place]) 07/16/2019  . Type 2 diabetes mellitus with stage 3a chronic kidney disease, with long-term current use of insulin ([place]) 07/16/2019  . Diabetic foot ulcer ([place]) 06/29/2019  . CRI (chronic renal insufficiency), stage 3 (moderate) ([place]) 01/24/2019  . Obesity 01/24/2019  . 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[place]) 11/13/2018  . Diabetic foot ([place]) 10/17/2018  . Non-pressure chronic ulcer of other part of right foot limited to breakdown of skin ([place]) 09/11/2018  . Type 2 diabetes mellitus with proliferative retinopathy of both eyes, without long-term current use of insulin ([place]) 07/14/2018  . Diabetic infection of left foot ([place]) 07/14/2018  . Wound cellulitis 05/22/2018  . Osteomyelitis of foot, acute ([place]) 05/21/2018  . 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 Diabetic ulcer of left midfoot associated with diabetes mellitus due to underlying condition, with fat layer exposed ([place]) 08/26/2017  . B12 deficiency 06/15/2017  . Vitamin D deficiency 06/03/2017  . Iron deficiency anemia 06/01/2017  .  Diabetic foot infection ([place])   . Osteomyelitis ([place]) 05/25/2017  . Vitreous floaters of right eye 09/15/2016  . Non compliance w medication regimen 09/15/2016  . Depression 09/01/2016  . Foot ulcer due to secondary DM ([place]) 08/20/2016  . Hidradenitis suppurativa 10/13/2015  . Peripheral polyneuropathy 07/07/2015  . Coronary atherosclerosis of native coronary artery 08/17/2013  . Diabetic retinopathy ([place]) 02/02/2013  . Diabetic neuropathy ([place]) 12/01/2012  . Hypertension associated with diabetes ([place]) 08/03/2012  . Tobacco use disorder 08/03/2012  . Erectile dysfunction 08/03/2012  . Diabetes mellitus with neurological manifestations, uncontrolled ([place]) 05/26/2006    Past Surgical History:  Procedure Laterality Date  . AMPUTATION Left 08/22/2016   Procedure: GREAT TOE AMPUTATION;  Surgeon: [name], MD;  Location: [place];  Service: Orthopedics;  Laterality: Left;  . AMPUTATION Right 05/28/2017   Procedure: AMPUTATION 1st & 3rd TOE;  Surgeon: [name], MD;  Location: [facility];  Service: Orthopedics;  Laterality: Right;  . EYE SURGERY     Both Eye Lasik   . IRRIGATION AND DEBRIDEMENT FOOT Left 06/29/2019   Procedure: IRRIGATION AND DEBRIDEMENT FOOT application wound vac;  Surgeon: [name], DPM;  Location: [facility];  Service: Podiatry;  Laterality: Left;  . LEFT HEART CATHETERIZATION WITH CORONARY ANGIOGRAM N/A 07/31/2013   Procedure: LEFT HEART CATHETERIZATION WITH CORONARY ANGIOGRAM;  Surgeon: Jettie Booze, MD;  Location: [facility] CATH LAB;  Service: Cardiovascular;  Laterality: N/A;  . spinal tap    . toe amputated         Home Medications    Prior to Admission medications   Medication Sig Start Date End Date Taking? Authorizing Provider  Accu-Chek FastClix Lancets MISC 1 Package by Does not apply route as directed. Use as instructed to test blood sugar 2 times daily E11.65 11/16/19   Gildardo Pounds, NP  amLODipine (NORVASC) 10 MG tablet Take 1 tablet (10 mg total)  by mouth daily. 11/16/19   Gildardo Pounds, NP  aspirin EC 81 MG tablet Take 1 tablet (81 mg total) by mouth daily. 10/23/19   Charlott Rakes, MD  atorvastatin (LIPITOR) 20 MG tablet Take 1 tablet (20 mg total) by mouth daily. 11/16/19 02/14/20  Gildardo Pounds, NP  b complex vitamins tablet Take 1 tablet by mouth daily. Patient not taking: Reported on 01/01/2020 10/18/18   Plotnikov, Evie Lacks, MD  blood glucose meter kit and supplies KIT Dispense based on patient and insurance preference. Use up to four times daily as directed. (FOR ICD-9 250.00, 250.01). 08/25/16   Minus Liberty, MD  Blood Pressure Monitor DEVI Please provide patient with insurance approved blood pressure monitor. I10.0 10/05/19   Gildardo Pounds, NP  carvedilol (COREG) 25 MG tablet Take 1 tablet (25 mg total) by mouth 2 (two) times daily. 11/16/19 05/14/20  Gildardo Pounds, NP  cetirizine (ZYRTEC) 10 MG tablet Take 1 tablet (10 mg total) by mouth daily. Prn itching 11/01/19   Argentina Donovan, PA-C  Cholecalciferol (VITAMIN D3) 50 MCG (2000 UT) capsule Take 1 capsule (2,000 Units total) by mouth daily. 10/23/19   Charlott Rakes, MD  cloNIDine (CATAPRES) 0.1 MG tablet Take 1 tablet (0.1 mg total) by mouth 2 (two) times daily. 07/13/19   Plotnikov, Evie Lacks, MD  doxycycline (VIBRAMYCIN) 100 MG capsule Take 1 capsule (100 mg total) by mouth 2 (two) times daily. 01/25/20   Tasia Catchings, Amy V, PA-C  escitalopram (LEXAPRO) 10 MG tablet Take 1 tablet (10 mg total) by mouth at bedtime. 10/23/19   Charlott Rakes, MD  ferrous sulfate 325 (65 FE) MG tablet Take 1 tablet (325 mg total) by mouth daily. 10/23/19 04/20/20  Charlott Rakes, MD  gabapentin (NEURONTIN) 300 MG capsule Take 1 capsule (300 mg total) by mouth 3 (three) times daily. Patient needs office visit before refills will be given 10/23/19   Gildardo Pounds, NP  glucose blood test strip Use as instructed to test blood sugar 2 times daily E11.65 11/16/19   Gildardo Pounds, NP  insulin  glargine (LANTUS SOLOSTAR) 100 UNIT/ML Solostar Pen Inject 15 Units into the skin daily. 10/19/19   Shamleffer, Melanie Crazier, MD  Insulin Pen Needle 32G X 4 MM MISC 1 each by Does not apply route daily. 07/02/19   Harold Hedge, MD  lisinopril (ZESTRIL) 10 MG tablet Take 1 tablet (10 mg total) by mouth daily. 01/01/20 03/31/20  Gildardo Pounds, NP  metFORMIN (GLUCOPHAGE) 500 MG tablet Take 1 tablet (500 mg total) by mouth 2 (two) times daily with a meal. 10/19/19   Shamleffer, Melanie Crazier, MD  Misc. Devices MISC Please provide patient with insurance approved diabetic shoes. E11.65 01/14/20   Gildardo Pounds, NP  nicotine (NICODERM CQ - DOSED IN MG/24 HOURS) 21 mg/24hr patch Place 1 patch (21 mg total) onto the skin daily. Call the pharmacy 1 week prior to completion for next set of patches 01/01/20 02/12/20  [name]  W, NP  traMADol (ULTRAM) 50 MG tablet Take 1 tablet (50 mg total) by mouth every 8 (eight) hours as needed. 01/25/20   Ok Edwards, PA-C    Family History Family History  Problem Relation Age of Onset  . Cancer Mother   . Lung cancer Mother 55  . Diabetes Father   . Hypertension Father   . Colon cancer Neg Hx   . Colon polyps Neg Hx   . Esophageal cancer Neg Hx   . Rectal cancer Neg Hx   . Stomach cancer Neg Hx     Social History Social History   Tobacco Use  . Smoking status: Current Every Day Smoker    Packs/day: 0.00    Years: 20.00    Pack years: 0.00    Types: Cigarettes  . Smokeless tobacco: Never Used  . Tobacco comment: wants patches   Vaping Use  . Vaping Use: Never used  Substance Use Topics  . Alcohol use: Not Currently    Comment: occ  . Drug use: No     Allergies   Bee venom, Penicillins, and Clonidine derivatives   Review of Systems Review of Systems  Reason unable to perform ROS: See HPI as above.     Physical Exam Triage Vital Signs ED Triage Vitals  Enc Vitals Group     BP 01/25/20 1148 (!) 195/124     Pulse Rate 01/25/20  1148 92     Resp 01/25/20 1148 18     Temp 01/25/20 1148 98.8 F (37.1 C)     Temp Source 01/25/20 1148 Oral     SpO2 01/25/20 1148 98 %     Weight --      Height --      Head Circumference --      Peak Flow --      Pain Score 01/25/20 1149 8     Pain Loc --      Pain Edu? --      Excl. in [place]? --    No data found.  Updated Vital Signs BP (!) 199/120 (BP Location: Left Arm)   Pulse 92   Temp 98.8 F (37.1 C) (Oral)   Resp 18   SpO2 98%   Physical Exam Constitutional:      General: He is not in acute distress.    Appearance: Normal appearance. He is well-developed. He is not toxic-appearing or diaphoretic.  HENT:     Head: Normocephalic and atraumatic.  Eyes:     Conjunctiva/sclera: Conjunctivae normal.     Pupils: Pupils are equal, round, and reactive to light.  Pulmonary:     Effort: Pulmonary effort is normal. No respiratory distress.  Musculoskeletal:     Cervical back: Normal range of motion and neck supple.  Skin:    General: Skin is warm and dry.     Comments: 2 abscess to the axilla approx 6cm x 3cm in size, seems to be communicating. No significant cellulitis noted.   Neurological:     Mental Status: He is alert and oriented to person, place, and time.      UC Treatments / Results  Labs (all labs ordered are listed, but only abnormal results are displayed) Labs Reviewed  AEROBIC CULTURE (SUPERFICIAL SPECIMEN)    EKG   Radiology No results found.  Procedures Incision and Drainage  Date/Time: 01/25/2020 5:15 PM Performed by: Ok Edwards, PA-C Authorized by: Ok Edwards, PA-C   Consent:    Consent obtained:  Verbal   Consent given by:  Patient   Risks discussed:  Bleeding, incomplete drainage, infection and pain   Alternatives discussed:  Referral Location:    Type:  Abscess   Location: left axilla  Pre-procedure details:    Skin preparation:  Chloraprep Anesthesia (see MAR for exact dosages):    Anesthesia method:  Local infiltration    Local anesthetic:  Lidocaine 2% WITH epi Procedure type:    Complexity:  Complex Procedure details:    Needle aspiration: no     Incision types:  Cruciate   Incision depth:  Subcutaneous   Scalpel blade:  11   Wound management:  Probed and deloculated, extensive cleaning and debrided   Drainage:  Purulent   Drainage amount:  Copious   Wound treatment:  Wound left open   Packing materials:  None Post-procedure details:    Patient tolerance of procedure:  Tolerated well, no immediate complications   (including critical care time)  Medications Ordered in UC Medications - No data to display  Initial Impression / Assessment and Plan / UC Course  I have reviewed the triage vital signs and the nursing notes.  Pertinent labs & imaging results that were available during my care of the patient were reviewed by me and considered in my medical decision making (see chart for details).    Patient tolerated procedure well. No significant cellulitis noted. Large communicating abscess, given history of DM, will send for wound culture and start on doxycycline.  Wound care instructions given. Return precautions given. Patient expresses understanding and agrees to plan.   Final Clinical Impressions(s) / UC Diagnoses   Final diagnoses:  Abscess of axilla, left    ED Prescriptions    Medication Sig Dispense Auth. Provider   doxycycline (VIBRAMYCIN) 100 MG capsule Take 1 capsule (100 mg total) by mouth 2 (two) times daily. 20 capsule Aadith Raudenbush V, PA-C   traMADol (ULTRAM) 50 MG tablet Take 1 tablet (50 mg total) by mouth every 8 (eight) hours as needed. 6 tablet Ok Edwards, PA-C     I have reviewed the PDMP during this encounter.   Ok Edwards, PA-C 01/25/20 1717

## 2020-01-25 NOTE — ED Triage Notes (Signed)
Pt with painful abscess under left axillary area x 1 week; denies drainage

## 2020-01-25 NOTE — Discharge Instructions (Addendum)
Start doxycycline as directed. Tylenol 1000mg  three times a day. Tramadol as needed. You can remove current dressing in 24 hours. Keep wound clean and dry. You can clean gently with soap and water. Do not soak area in water. Monitor for spreading redness, increased warmth, increased swelling, fever, go to the ED for further evaluation. Otherwise follow up with PCP in 1 week for recheck

## 2020-01-27 LAB — AEROBIC CULTURE W GRAM STAIN (SUPERFICIAL SPECIMEN)

## 2020-01-28 ENCOUNTER — Other Ambulatory Visit: Payer: Self-pay | Admitting: Nurse Practitioner

## 2020-01-28 DIAGNOSIS — E1159 Type 2 diabetes mellitus with other circulatory complications: Secondary | ICD-10-CM

## 2020-01-28 DIAGNOSIS — I25119 Atherosclerotic heart disease of native coronary artery with unspecified angina pectoris: Secondary | ICD-10-CM

## 2020-01-28 DIAGNOSIS — I5189 Other ill-defined heart diseases: Secondary | ICD-10-CM

## 2020-01-28 MED ORDER — INSULIN LISPRO (1 UNIT DIAL) 100 UNIT/ML (KWIKPEN): PEN_INJECTOR | SUBCUTANEOUS | 5 refills | Status: DC

## 2020-01-28 MED ORDER — LANTUS SOLOSTAR 100 UNIT/ML ~~LOC~~ SOPN: 15.0000 [IU] | PEN_INJECTOR | Freq: Two times a day (BID) | SUBCUTANEOUS | 0 refills | Status: DC

## 2020-01-28 MED ORDER — INSULIN PEN NEEDLE 32G X 4 MM MISC: 1 refills | Status: DC

## 2020-01-29 ENCOUNTER — Ambulatory Visit (INDEPENDENT_AMBULATORY_CARE_PROVIDER_SITE_OTHER): Payer: Medicaid Other | Admitting: Podiatry

## 2020-01-29 ENCOUNTER — Other Ambulatory Visit: Payer: Self-pay

## 2020-01-29 DIAGNOSIS — L97522 Non-pressure chronic ulcer of other part of left foot with fat layer exposed: Secondary | ICD-10-CM | POA: Diagnosis not present

## 2020-01-29 DIAGNOSIS — L97511 Non-pressure chronic ulcer of other part of right foot limited to breakdown of skin: Secondary | ICD-10-CM

## 2020-01-29 NOTE — Progress Notes (Signed)
  Subjective:  Patient ID: [name], male    DOB: 09-22-1970,  MRN: [id number]  Chief Complaint  Patient presents with  . Wound Check    Bilateral foot wounds, pt concerned with appearance. Denies fever/nausea/vomiting/chills.   49 y.o. male presents for wound care. Hx confirmed with patient. Purchased shoes because he is having issues with getting the DM shoes that were ordered. Objective:  Physical Exam: Wound Location: left midfoot Wound Measurement: 3x1 Wound Base: fibrotic, macerated Peri-wound: hyperkeratotic Exudate: dry wound without warmth, erythema, signs of acute infection  Wound Location: right submet 2/3 Wound Measurement: epithealized Wound Base: hyperatotic, fibrotic Peri-wound: Hyperkeratotic wound without warmth, erythema, signs of acute infection  Assessment:   1. Ulcer of left foot with fat layer exposed ([place])   2. Ulcerated, foot, right, limited to breakdown of skin [facility])    Plan:  Patient was evaluated and treated and all questions answered.  Ulcers bilateral feet -Left recurrence noted. -Right appears heald. -Discussed again the importance of compliance with the boot and getting his Diabetic shoes. We will keep him in the boot  Procedure: Excisional Debridement of Wound Indication: Removal of non-viable soft tissue from the wound to promote healing.  Anesthesia: none Pre-Debridement Wound Measurements: overlying HPK   Post-Debridement Wound Measurements: 3 cm x 1 cm x 0.2 cm  Type of Debridement: Sharp Excisional Tissue Removed: Non-viable soft tissue Instrumentation: 15 blade and tissue nipper Depth of Debridement: subcutaneous tissue. Technique: Sharp excisional debridement to bleeding, viable wound base.  Dressing: Dry, sterile, compression dressing. Disposition: Patient tolerated procedure well. Patient to return in 1 week for follow-up.  No follow-ups on file.

## 2020-02-07 ENCOUNTER — Telehealth: Payer: Self-pay | Admitting: Nurse Practitioner

## 2020-02-07 NOTE — Telephone Encounter (Signed)
Copied from [place] [phone]. Topic: General - Other >> Feb 07, 2020  1:10 PM Leward Quan A wrote: Reason for CRM: Patient called in to inquire of [name] that since he is on so many medication he would like an updated  list sent to his My Cart of what he is to be taking and when to take them, any questions please call Ph# [phone]

## 2020-02-08 NOTE — Telephone Encounter (Signed)
Copied from [place] [id number]. Topic: General - Inquiry >> Feb 08, 2020  1:29 PM Gillis Ends D wrote: Reason for CRM: Patient wanted to know why he was referred back to a Cardiologist. He can be reached at [phone]. Please advise

## 2020-02-12 ENCOUNTER — Other Ambulatory Visit: Payer: Self-pay | Admitting: Nurse Practitioner

## 2020-02-12 ENCOUNTER — Ambulatory Visit: Payer: Medicaid Other | Attending: Nurse Practitioner

## 2020-02-12 ENCOUNTER — Other Ambulatory Visit: Payer: Self-pay

## 2020-02-12 DIAGNOSIS — E1122 Type 2 diabetes mellitus with diabetic chronic kidney disease: Secondary | ICD-10-CM

## 2020-02-12 DIAGNOSIS — N1831 Chronic kidney disease, stage 3a: Secondary | ICD-10-CM

## 2020-02-12 DIAGNOSIS — Z794 Long term (current) use of insulin: Secondary | ICD-10-CM

## 2020-02-13 ENCOUNTER — Other Ambulatory Visit: Payer: Self-pay | Admitting: Nurse Practitioner

## 2020-02-13 LAB — CMP14+EGFR
ALT: 11 IU/L (ref 0–44)
AST: 10 IU/L (ref 0–40)
Albumin/Globulin Ratio: 1 — ABNORMAL LOW (ref 1.2–2.2)
Albumin: 3.3 g/dL — ABNORMAL LOW (ref 4.0–5.0)
Alkaline Phosphatase: 109 IU/L (ref 44–121)
BUN/Creatinine Ratio: 8 — ABNORMAL LOW (ref 9–20)
BUN: 21 mg/dL (ref 6–24)
Bilirubin Total: <0.2 mg/dL (ref 0.0–1.2)
CO2: 22 mmol/L (ref 20–29)
Calcium: 8.9 mg/dL (ref 8.7–10.2)
Chloride: 104 mmol/L (ref 96–106)
Creatinine, Ser: 2.79 mg/dL — ABNORMAL HIGH (ref 0.76–1.27)
GFR calc Af Amer: 29 mL/min/{1.73_m2} — ABNORMAL LOW (ref >59)
GFR calc non Af Amer: 25 mL/min/{1.73_m2} — ABNORMAL LOW (ref >59)
Globulin, Total: 3.4 g/dL (ref 1.5–4.5)
Glucose: 226 mg/dL — ABNORMAL HIGH (ref 65–99)
Potassium: 4.4 mmol/L (ref 3.5–5.2)
Sodium: 138 mmol/L (ref 134–144)
Total Protein: 6.7 g/dL (ref 6.0–8.5)

## 2020-02-14 NOTE — Telephone Encounter (Signed)
CMA spoke to patient and informed him PCP refer him to Cardiologist based on her Echocardiogram results. Pt. Understood.

## 2020-02-18 NOTE — Telephone Encounter (Signed)
Spoke to patient and informed that his Lantus should be cover now under his insurance.  CMA spoke to the pharmacist and was able to have them change it to a Brand instead of Generic.

## 2020-02-18 NOTE — Telephone Encounter (Signed)
Copied from [place] [phone]. Topic: General - Other >> Feb 18, 2020 11:35 AM Yvette Rack wrote: Reason for CRM: Pt stated he was advised by his pharmacy that his insurance will not cover the cost for his insulin. Pt stated he will need a Rx for another medication that his insurance will cover. Pt requests call back

## 2020-02-26 ENCOUNTER — Ambulatory Visit: Payer: Medicaid Other | Attending: Nurse Practitioner | Admitting: Nurse Practitioner

## 2020-02-26 ENCOUNTER — Other Ambulatory Visit: Payer: Self-pay

## 2020-02-26 ENCOUNTER — Encounter: Payer: Self-pay | Admitting: Nurse Practitioner

## 2020-02-26 DIAGNOSIS — F32A Depression, unspecified: Secondary | ICD-10-CM

## 2020-02-26 DIAGNOSIS — E785 Hyperlipidemia, unspecified: Secondary | ICD-10-CM

## 2020-02-26 DIAGNOSIS — I25119 Atherosclerotic heart disease of native coronary artery with unspecified angina pectoris: Secondary | ICD-10-CM

## 2020-02-26 DIAGNOSIS — Z794 Long term (current) use of insulin: Secondary | ICD-10-CM | POA: Diagnosis not present

## 2020-02-26 DIAGNOSIS — E1142 Type 2 diabetes mellitus with diabetic polyneuropathy: Secondary | ICD-10-CM | POA: Diagnosis not present

## 2020-02-26 DIAGNOSIS — F17200 Nicotine dependence, unspecified, uncomplicated: Secondary | ICD-10-CM

## 2020-02-26 DIAGNOSIS — I10 Essential (primary) hypertension: Secondary | ICD-10-CM | POA: Diagnosis not present

## 2020-02-26 DIAGNOSIS — D509 Iron deficiency anemia, unspecified: Secondary | ICD-10-CM

## 2020-02-26 DIAGNOSIS — F419 Anxiety disorder, unspecified: Secondary | ICD-10-CM

## 2020-02-26 MED ORDER — CARVEDILOL 25 MG PO TABS: 25.0000 mg | ORAL_TABLET | Freq: Two times a day (BID) | ORAL | 1 refills | Status: DC

## 2020-02-26 MED ORDER — ACCU-CHEK FASTCLIX LANCETS MISC: 1.0000 | 12 refills | Status: DC

## 2020-02-26 MED ORDER — AMLODIPINE BESYLATE 10 MG PO TABS: 10.0000 mg | ORAL_TABLET | Freq: Every day | ORAL | 3 refills | Status: DC

## 2020-02-26 MED ORDER — LANTUS SOLOSTAR 100 UNIT/ML ~~LOC~~ SOPN: 15.0000 [IU] | PEN_INJECTOR | Freq: Two times a day (BID) | SUBCUTANEOUS | 0 refills | Status: DC

## 2020-02-26 MED ORDER — ASPIRIN EC 81 MG PO TBEC: 81.0000 mg | DELAYED_RELEASE_TABLET | Freq: Every day | ORAL | 1 refills | Status: DC

## 2020-02-26 MED ORDER — ESCITALOPRAM OXALATE 10 MG PO TABS: 10.0000 mg | ORAL_TABLET | Freq: Every day | ORAL | 1 refills | Status: DC

## 2020-02-26 MED ORDER — BUPROPION HCL ER (SR) 150 MG PO TB12: 150.0000 mg | ORAL_TABLET | Freq: Every day | ORAL | 0 refills | Status: DC

## 2020-02-26 MED ORDER — ATORVASTATIN CALCIUM 20 MG PO TABS: 20.0000 mg | ORAL_TABLET | Freq: Every day | ORAL | 1 refills | Status: DC

## 2020-02-26 MED ORDER — FERROUS SULFATE 325 (65 FE) MG PO TABS: 325.0000 mg | ORAL_TABLET | Freq: Every day | ORAL | 1 refills | Status: DC

## 2020-02-26 MED ORDER — GABAPENTIN 300 MG PO CAPS: 300.0000 mg | ORAL_CAPSULE | Freq: Three times a day (TID) | ORAL | 3 refills | Status: DC

## 2020-02-26 MED ORDER — INSULIN PEN NEEDLE 32G X 4 MM MISC: 1 refills | Status: DC

## 2020-02-26 MED ORDER — LISINOPRIL 10 MG PO TABS: 10.0000 mg | ORAL_TABLET | Freq: Every day | ORAL | 3 refills | Status: DC

## 2020-02-26 MED ORDER — GLUCOSE BLOOD VI STRP: ORAL_STRIP | 12 refills | Status: DC

## 2020-02-26 MED ORDER — INSULIN LISPRO (1 UNIT DIAL) 100 UNIT/ML (KWIKPEN): PEN_INJECTOR | SUBCUTANEOUS | 5 refills | Status: DC

## 2020-02-26 NOTE — Progress Notes (Addendum)
Virtual Visit via Telephone Note Due to national recommendations of social distancing due to [place] 19, telehealth visit is felt to be most appropriate for this patient at this time.  I discussed the limitations, risks, security and privacy concerns of performing an evaluation and management service by telephone and the availability of in person appointments. I also discussed with the patient that there may be a patient responsible charge related to this service. The patient expressed understanding and agreed to proceed.    I connected with Brandun Pinn Motton on 02/26/20  at  11:10 AM EST  EDT by telephone and verified that I am speaking with the correct person using two identifiers.   Consent I discussed the limitations, risks, security and privacy concerns of performing an evaluation and management service by telephone and the availability of in person appointments. I also discussed with the patient that there may be a patient responsible charge related to this service. The patient expressed understanding and agreed to proceed.   Location of Patient: Private Residence    Location of Provider: [facility] [employer] [facility]    Persons participating in Telemedicine visit: [name] FNP-BC [place]    History of Present Illness: Telemedicine visit for: F/U PMH:  Anemia, Diabetes mellitus type II, uncontrolled with neuropathy, diabetic foot ulcer and toe amputation, Coronary atherosclerosis,  Glaucoma, Hyperlipidemia, Hypertension, unspecified angina pectoris, and Tobacco use,  DM TYPE 2 He is not at home and does not have his meter. Last reading he can recall was a fasting reading of 135. Diabetes is not well controlled. He endorses mediation adherence taking lantus 15 units BID, SSI Humalog. Takes gabapentin for peripheral neuropathy. On ACE and Statin. LDL at goal  Lab Results  Component Value Date   HGBA1C 8.7 (A) 10/19/2019   Lab Results   Component Value Date   LDLCALC 39 10/10/2019   Essential Hypertension Continues to smoke. Would like to quit. Will try wellbutrin. Nicotine patches did not work for him. He is only taking 0.1 mg clonidine daily instead of BID. States when he takes the second dose it makes him dizzy. Blood pressures at home still slightly elevated with last reading 140/100.Marland Kitchen He does have a history of non adherence. Other medications include amlodipine 10 mg daily, carvedilol 25 mg BID and lisinopril 10 mg daily. Denies chest pain, shortness of breath, palpitations, lightheadedness, dizziness, headaches or BLE edema. Will have him come in for BP check and may need to increase lisinopril based on renal function. His last appt with nephrology was 12-28-2019. BP Readings from Last 3 Encounters:  01/25/20 (!) 199/120  01/22/20 113/79  01/01/20 (!) 181/98    Past Medical History:  Diagnosis Date  . Allergy    seasonal allergies  . Anemia    on meds  . Diabetes mellitus type II, uncontrolled ([place])    on meds  . Glaucoma    right   . Hyperlipidemia    on meds  . Hypertension    on meds  . Other and unspecified angina pectoris   . Tobacco use     Past Surgical History:  Procedure Laterality Date  . AMPUTATION Left 08/22/2016   Procedure: GREAT TOE AMPUTATION;  Surgeon: [name], MD;  Location: [facility];  Service: Orthopedics;  Laterality: Left;  . AMPUTATION Right 05/28/2017   Procedure: AMPUTATION 1st & 3rd TOE;  Surgeon: [name], MD;  Location: [facility];  Service: Orthopedics;  Laterality: Right;  .  EYE SURGERY     Both Eye Lasik   . IRRIGATION AND DEBRIDEMENT FOOT Left 06/29/2019   Procedure: IRRIGATION AND DEBRIDEMENT FOOT application wound vac;  Surgeon: [name], DPM;  Location: [facility];  Service: Podiatry;  Laterality: Left;  . LEFT HEART CATHETERIZATION WITH CORONARY ANGIOGRAM N/A 07/31/2013   Procedure: LEFT HEART CATHETERIZATION WITH CORONARY ANGIOGRAM;  Surgeon: Jettie Booze,  MD;  Location: [facility] CATH [facility];  Service: Cardiovascular;  Laterality: N/A;  . spinal tap    . toe amputated      Family History  Problem Relation Age of Onset  . Cancer Mother   . Lung cancer Mother 32  . Diabetes Father   . Hypertension Father   . Colon cancer Neg Hx   . Colon polyps Neg Hx   . Esophageal cancer Neg Hx   . Rectal cancer Neg Hx   . Stomach cancer Neg Hx     Social History   Socioeconomic History  . Marital status: Legally Separated    Spouse name: Not on file  . Number of children: Not on file  . Years of education: Not on file  . Highest education level: Not on file  Occupational History  . Occupation: Leisure centre manager:  [employer]  Tobacco Use  . Smoking status: Current Every Day Smoker    Packs/day: 0.00    Years: 20.00    Pack years: 0.00    Types: Cigarettes  . Smokeless tobacco: Never Used  . Tobacco comment: wants patches   Vaping Use  . Vaping Use: Never used  Substance and Sexual Activity  . Alcohol use: Not Currently    Comment: occ  . Drug use: No  . Sexual activity: Not Currently  Other Topics Concern  . Not on file  Social History Narrative   Works in [employer], recently moved in early 2018 from [place]. Previous saw free clinic providers.       Living with and helping take are of his elderly father.   Social Determinants of Health   Financial Resource Strain:   . Difficulty of Paying Living Expenses: Not on file  Food Insecurity:   . Worried About Charity fundraiser in the Last Year: Not on file  . Ran Out of Food in the Last Year: Not on file  Transportation Needs:   . Lack of Transportation (Medical): Not on file  . Lack of Transportation (Non-Medical): Not on file  Physical Activity:   . Days of Exercise per Week: Not on file  . Minutes of Exercise per Session: Not on file  Stress:   . Feeling of Stress : Not on file  Social Connections:   . Frequency of Communication with Friends and Family: Not  on file  . Frequency of Social Gatherings with Friends and Family: Not on file  . Attends Religious Services: Not on file  . Active Member of Clubs or Organizations: Not on file  . Attends Archivist Meetings: Not on file  . Marital Status: Not on file     Observations/Objective: Awake, alert and oriented x 3   Review of Systems  Constitutional: Negative for fever, malaise/fatigue and weight loss.  HENT: Negative.  Negative for nosebleeds.   Eyes: Negative.  Negative for blurred vision, double vision and photophobia.  Respiratory: Negative.  Negative for cough and shortness of breath.   Cardiovascular: Negative.  Negative for chest pain, palpitations and leg swelling.  Gastrointestinal: Negative.  Negative for heartburn, nausea and vomiting.  Musculoskeletal: Negative.  Negative for myalgias.  Neurological: Negative.  Negative for dizziness, focal weakness, seizures and headaches.  Psychiatric/Behavioral: Positive for depression. Negative for suicidal ideas.    Assessment and Plan: Diagnoses and all orders for this visit:  Type 2 diabetes mellitus with diabetic polyneuropathy, with long-term current use of insulin ([name]) -     gabapentin (NEURONTIN) 300 MG capsule; Take 1 capsule (300 mg total) by mouth 3 (three) times daily. Patient needs office visit before refills will be given -     glucose blood test strip; Use as instructed to test blood sugar 2 times daily E11.65 -     insulin glargine (LANTUS SOLOSTAR) 100 UNIT/ML Solostar Pen; Inject 15 Units into the skin 2 (two) times daily. -     insulin lispro (HUMALOG KWIKPEN) 100 UNIT/ML KwikPen; For blood sugars 0-150 give 0 units of insulin, 151-200 give 2 units of insulin, 201-250 give 4 units, 251-300 give 6 units, 301-350 give 8 units, 351-400 give 10 units,> 400 give 12 units and call M.D. Discussed hypoglycemia protocol. -     Insulin Pen Needle 32G X 4 MM MISC; Use as instructed. Inject into the skin three time daily. -      Accu-Chek FastClix Lancets MISC; 1 Package by Does not apply route as directed. Use as instructed to test blood sugar 2 times daily E11.65 Continue blood sugar control as discussed in office today, low carbohydrate diet, and regular physical exercise as tolerated, 150 minutes per week (30 min each day, 5 days per week, or 50 min 3 days per week). Keep blood sugar logs with fasting goal of 90-130 mg/dl, post prandial (after you eat) less than 180.  For Hypoglycemia: BS <60 and Hyperglycemia BS >400; contact the clinic ASAP. Annual eye exams and foot exams are recommended.   Dyslipidemia, goal LDL below 70 -     atorvastatin (LIPITOR) 20 MG tablet; Take 1 tablet (20 mg total) by mouth daily. INSTRUCTIONS: Work on a low fat, heart healthy diet and participate in regular aerobic exercise program by working out at least 150 minutes per week; 5 days a week-30 minutes per day. Avoid red meat/beef/steak,  fried foods. junk foods, sodas, sugary drinks, unhealthy snacking, alcohol and smoking.  Drink at least 80 oz of water per day and monitor your carbohydrate intake daily.    Essential hypertension -     carvedilol (COREG) 25 MG tablet; Take 1 tablet (25 mg total) by mouth 2 (two) times daily. -     lisinopril (ZESTRIL) 10 MG tablet; Take 1 tablet (10 mg total) by mouth daily. -     amLODipine (NORVASC) 10 MG tablet; Take 1 tablet (10 mg total) by mouth daily. Continue all antihypertensives as prescribed.  Remember to bring in your blood pressure log with you for your follow up appointment.  DASH/Mediterranean Diets are healthier choices for HTN.    Tobacco dependency -     buPROPion (WELLBUTRIN SR) 150 MG 12 hr tablet; Take 1 tablet (150 mg total) by mouth daily.  Anxiety and depression -     escitalopram (LEXAPRO) 10 MG tablet; Take 1 tablet (10 mg total) by mouth at bedtime.  Atherosclerosis of native coronary artery of native heart with angina pectoris ([name]) -     aspirin EC 81 MG tablet;  Take 1 tablet (81 mg total) by mouth daily.  Iron deficiency anemia, unspecified iron deficiency anemia type -  ferrous sulfate 325 (65 FE) MG tablet; Take 1 tablet (325 mg total) by mouth daily.     Follow Up Instructions Return in about 6 weeks (around 04/08/2020).     I discussed the assessment and treatment plan with the patient. The patient was provided an opportunity to ask questions and all were answered. The patient agreed with the plan and demonstrated an understanding of the instructions.   The patient was advised to call back or seek an in-person evaluation if the symptoms worsen or if the condition fails to improve as anticipated.  I provided 20 minutes of non-face-to-face time during this encounter including median intraservice time, reviewing previous notes, labs, imaging, medications and explaining diagnosis and management.  Gildardo Pounds, FNP-BC

## 2020-02-27 ENCOUNTER — Other Ambulatory Visit: Payer: Self-pay | Admitting: Nurse Practitioner

## 2020-02-27 DIAGNOSIS — Z1211 Encounter for screening for malignant neoplasm of colon: Secondary | ICD-10-CM

## 2020-02-27 DIAGNOSIS — E785 Hyperlipidemia, unspecified: Secondary | ICD-10-CM

## 2020-02-27 DIAGNOSIS — IMO0002 Reserved for concepts with insufficient information to code with codable children: Secondary | ICD-10-CM

## 2020-02-27 DIAGNOSIS — E1149 Type 2 diabetes mellitus with other diabetic neurological complication: Secondary | ICD-10-CM

## 2020-02-27 DIAGNOSIS — E1165 Type 2 diabetes mellitus with hyperglycemia: Secondary | ICD-10-CM

## 2020-02-27 DIAGNOSIS — D509 Iron deficiency anemia, unspecified: Secondary | ICD-10-CM

## 2020-02-29 ENCOUNTER — Other Ambulatory Visit: Payer: Self-pay

## 2020-02-29 ENCOUNTER — Ambulatory Visit (INDEPENDENT_AMBULATORY_CARE_PROVIDER_SITE_OTHER): Payer: Medicaid Other | Admitting: Podiatry

## 2020-02-29 DIAGNOSIS — L97522 Non-pressure chronic ulcer of other part of left foot with fat layer exposed: Secondary | ICD-10-CM | POA: Diagnosis not present

## 2020-02-29 DIAGNOSIS — L97511 Non-pressure chronic ulcer of other part of right foot limited to breakdown of skin: Secondary | ICD-10-CM

## 2020-02-29 NOTE — Progress Notes (Signed)
  Subjective:  Patient ID: [name], male    DOB: [birth year],  MRN: [id number]  Chief Complaint  Patient presents with  . Wound Check    PT stated that he is doing okay he does have some pain but its not as bad. He denies any fever/chills/nausea and vomitting at this time.    49 y.o. male presents for wound care. Hx confirmed with patient. Purchased shoes because he is having issues with getting the DM shoes that were ordered. Objective:  Physical Exam: Wound Location: left midfoot Wound Measurement: 1x1 Wound Base: fibrotic, macerated Peri-wound: hyperkeratotic Exudate: dry wound without warmth, erythema, signs of acute infection  Wound Location: right submet 2/3 Wound Measurement: 0.5x0.3 Wound Base: hyperatotic, fibrotic Peri-wound: Hyperkeratotic wound without warmth, erythema, signs of acute infection  Assessment:   1. Ulcer of left foot with fat layer exposed ([place])   2. Ulcerated, foot, right, limited to breakdown of skin [facility])    Plan:  Patient was evaluated and treated and all questions answered.  Ulcers bilateral feet -Both wounds healing. It is imperative he get diabetic shoes with custom inserts if these are to go on to complete healing and not cause further consequence. These were thoroughly debrided today. -Rx written for him to get DM [facility].  Procedure: Excisional Debridement of Wound Indication: Removal of non-viable soft tissue from the wound to promote healing.  Anesthesia: none Pre-Debridement Wound Measurements: overlying HPK Post-Debridement Wound Measurements: 1 cm x 1 cm x 0.2 cm, 0.5x0.3x0.2 Type of Debridement: Sharp Excisional Tissue Removed: Non-viable soft tissue Instrumentation: 15 blade and tissue nipper Depth of Debridement: subcutaneous tissue. Technique: Sharp excisional debridement to bleeding, viable wound base.  Dressing: Dry, sterile, compression dressing. Disposition: Patient tolerated procedure well. Patient to  return in 1 week for follow-up.     No follow-ups on file.

## 2020-03-03 ENCOUNTER — Other Ambulatory Visit: Payer: Self-pay

## 2020-03-03 ENCOUNTER — Ambulatory Visit: Payer: Medicaid Other | Attending: Nurse Practitioner

## 2020-03-03 DIAGNOSIS — IMO0002 Reserved for concepts with insufficient information to code with codable children: Secondary | ICD-10-CM

## 2020-03-03 DIAGNOSIS — E1165 Type 2 diabetes mellitus with hyperglycemia: Secondary | ICD-10-CM

## 2020-03-03 DIAGNOSIS — E11628 Type 2 diabetes mellitus with other skin complications: Secondary | ICD-10-CM

## 2020-03-03 DIAGNOSIS — E1149 Type 2 diabetes mellitus with other diabetic neurological complication: Secondary | ICD-10-CM

## 2020-03-03 DIAGNOSIS — E11621 Type 2 diabetes mellitus with foot ulcer: Secondary | ICD-10-CM

## 2020-03-03 DIAGNOSIS — L089 Local infection of the skin and subcutaneous tissue, unspecified: Secondary | ICD-10-CM

## 2020-03-03 DIAGNOSIS — D509 Iron deficiency anemia, unspecified: Secondary | ICD-10-CM

## 2020-03-04 LAB — CBC WITH DIFFERENTIAL/PLATELET
Basophils Absolute: 0 10*3/uL (ref 0.0–0.2)
Basos: 0 %
EOS (ABSOLUTE): 0.3 10*3/uL (ref 0.0–0.4)
Eos: 2 %
Hematocrit: 33.9 % — ABNORMAL LOW (ref 37.5–51.0)
Hemoglobin: 10.6 g/dL — ABNORMAL LOW (ref 13.0–17.7)
Immature Grans (Abs): 0.1 10*3/uL (ref 0.0–0.1)
Immature Granulocytes: 1 %
Lymphocytes Absolute: 2.6 10*3/uL (ref 0.7–3.1)
Lymphs: 20 %
MCH: 27 pg (ref 26.6–33.0)
MCHC: 31.3 g/dL — ABNORMAL LOW (ref 31.5–35.7)
MCV: 87 fL (ref 79–97)
Monocytes Absolute: 0.7 10*3/uL (ref 0.1–0.9)
Monocytes: 5 %
Neutrophils Absolute: 9.2 10*3/uL — ABNORMAL HIGH (ref 1.4–7.0)
Neutrophils: 72 %
Platelets: 301 10*3/uL (ref 150–450)
RBC: 3.92 x10E6/uL — ABNORMAL LOW (ref 4.14–5.80)
RDW: 14.1 % (ref 11.6–15.4)
WBC: 12.8 10*3/uL — ABNORMAL HIGH (ref 3.4–10.8)

## 2020-03-04 LAB — HEMOGLOBIN A1C
Est. average glucose Bld gHb Est-mCnc: 180 mg/dL
Hgb A1c MFr Bld: 7.9 % — ABNORMAL HIGH (ref 4.8–5.6)

## 2020-03-06 ENCOUNTER — Ambulatory Visit (INDEPENDENT_AMBULATORY_CARE_PROVIDER_SITE_OTHER): Payer: Medicaid Other | Admitting: Interventional Cardiology

## 2020-03-06 ENCOUNTER — Encounter: Payer: Self-pay | Admitting: Interventional Cardiology

## 2020-03-06 ENCOUNTER — Other Ambulatory Visit: Payer: Self-pay

## 2020-03-06 VITALS — BP 160/94 | HR 82 | Ht 65.5 in | Wt 201.4 lb

## 2020-03-06 DIAGNOSIS — Z794 Long term (current) use of insulin: Secondary | ICD-10-CM

## 2020-03-06 DIAGNOSIS — I10 Essential (primary) hypertension: Secondary | ICD-10-CM

## 2020-03-06 DIAGNOSIS — E1151 Type 2 diabetes mellitus with diabetic peripheral angiopathy without gangrene: Secondary | ICD-10-CM

## 2020-03-06 DIAGNOSIS — I25119 Atherosclerotic heart disease of native coronary artery with unspecified angina pectoris: Secondary | ICD-10-CM

## 2020-03-06 DIAGNOSIS — N1832 Chronic kidney disease, stage 3b: Secondary | ICD-10-CM | POA: Diagnosis not present

## 2020-03-06 MED ORDER — CLONIDINE HCL 0.1 MG PO TABS: 0.1000 mg | ORAL_TABLET | Freq: Two times a day (BID) | ORAL | 11 refills | Status: DC

## 2020-03-06 NOTE — Patient Instructions (Signed)
Medication Instructions:  Your physician has recommended you make the following change in your medication:   TAKE: clonidine 0.1 mg tablet twice a day  *If you need a refill on your cardiac medications before your next appointment, please call your pharmacy*   Lab Work: None   If you have labs (blood work) drawn today and your tests are completely normal, you will receive your results only by: Marland Kitchen MyChart Message (if you have MyChart) OR . A paper copy in the mail If you have any lab test that is abnormal or we need to change your treatment, we will call you to review the results.   Testing/Procedures: Your physician has requested that you have en exercise stress myoview. For further information please visit [email or web address]. Please follow instruction sheet, as given.  Follow-Up: Based on test results   Other Instructions  High-Fiber Diet Fiber, also called dietary fiber, is a type of carbohydrate that is found in fruits, vegetables, whole grains, and beans. A high-fiber diet can have many health benefits. Your health care provider may recommend a high-fiber diet to help:  Prevent constipation. Fiber can make your bowel movements more regular.  Lower your cholesterol.  Relieve the following conditions: ? Swelling of veins in the anus (hemorrhoids). ? Swelling and irritation (inflammation) of specific areas of the digestive tract (uncomplicated diverticulosis). ? A problem of the large intestine (colon) that sometimes causes pain and diarrhea (irritable bowel syndrome, IBS).  Prevent overeating as part of a weight-loss plan.  Prevent heart disease, type 2 diabetes, and certain cancers. What is my plan? The recommended daily fiber intake in grams (g) includes:  38 g for men age 45 or younger.  30 g for men over age 92.  49 g for women age 35 or younger.  21 g for women over age 90. You can get the recommended daily intake of dietary fiber by:  Eating a variety of  fruits, vegetables, grains, and beans.  Taking a fiber supplement, if it is not possible to get enough fiber through your diet. What do I need to know about a high-fiber diet?  It is better to get fiber through food sources rather than from fiber supplements. There is not a lot of research about how effective supplements are.  Always check the fiber content on the nutrition facts label of any prepackaged food. Look for foods that contain 5 g of fiber or more per serving.  Talk with a diet and nutrition specialist (dietitian) if you have questions about specific foods that are recommended or not recommended for your medical condition, especially if those foods are not listed below.  Gradually increase how much fiber you consume. If you increase your intake of dietary fiber too quickly, you may have bloating, cramping, or gas.  Drink plenty of water. Water helps you to digest fiber. What are tips for following this plan?  Eat a wide variety of high-fiber foods.  Make sure that half of the grains that you eat each day are whole grains.  Eat breads and cereals that are made with whole-grain flour instead of refined flour or white flour.  Eat brown rice, bulgur wheat, or millet instead of white rice.  Start the day with a breakfast that is high in fiber, such as a cereal that contains 5 g of fiber or more per serving.  Use beans in place of meat in soups, salads, and pasta dishes.  Eat high-fiber snacks, such as berries, raw vegetables, nuts, and popcorn.  Choose whole fruits and vegetables instead of processed forms like juice or sauce. What foods can I eat?  Fruits Berries. Pears. Apples. Oranges. Avocado. Prunes and raisins. Dried figs. Vegetables Sweet potatoes. Spinach. Kale. Artichokes. Cabbage. Broccoli. Cauliflower. Green peas. Carrots. Squash. Grains Whole-grain breads. Multigrain cereal. Oats and oatmeal. Brown rice. Barley. Bulgur wheat. Petrey. Quinoa. Bran muffins.  Popcorn. Rye wafer crackers. Meats and other proteins Navy, kidney, and pinto beans. Soybeans. Split peas. Lentils. Nuts and seeds. Dairy Fiber-fortified yogurt. Beverages Fiber-fortified soy milk. Fiber-fortified orange juice. Other foods Fiber bars. The items listed above may not be a complete list of recommended foods and beverages. Contact a dietitian for more options. What foods are not recommended? Fruits Fruit juice. Cooked, strained fruit. Vegetables Fried potatoes. Canned vegetables. Well-cooked vegetables. Grains White bread. Pasta made with refined flour. White rice. Meats and other proteins Fatty cuts of meat. Fried chicken or fried fish. Dairy Milk. Yogurt. Cream cheese. Sour cream. Fats and oils Butters. Beverages Soft drinks. Other foods Cakes and pastries. The items listed above may not be a complete list of foods and beverages to avoid. Contact a dietitian for more information. Summary  Fiber is a type of carbohydrate. It is found in fruits, vegetables, whole grains, and beans.  There are many health benefits of eating a high-fiber diet, such as preventing constipation, lowering blood cholesterol, helping with weight loss, and reducing your risk of heart disease, diabetes, and certain cancers.  Gradually increase your intake of fiber. Increasing too fast can result in cramping, bloating, and gas. Drink plenty of water while you increase your fiber.  The best sources of fiber include whole fruits and vegetables, whole grains, nuts, seeds, and beans. This information is not intended to replace advice given to you by your health care provider. Make sure you discuss any questions you have with your health care provider. Document Revised: 01/17/2017 Document Reviewed: 01/17/2017 Elsevier Patient Education  2020 [employer].

## 2020-03-06 NOTE — Progress Notes (Signed)
Cardiology Office Note   Date:  03/06/2020   ID:  [name], DOB 07-27-1970, MRN [id number]  PCP:  Joseph Pounds, NP    No chief complaint on file.  CAD  Wt Readings from Last 3 Encounters:  03/06/20 201 lb 6.4 oz (91.4 kg)  01/01/20 199 lb (90.3 kg)  12/10/19 197 lb 9.6 oz (89.6 kg)       History of Present Illness: [name] is a 49 y.o. male who is being seen today for the evaluation of CAD at the request of Joseph Pounds, NP.  Records show I saw him in 2015.    Echo in 2016 showed: "Left ventricular systolic function is normal.  LV ejection fraction = 55-60%.   Left ventricular filling pattern is impaired.  The right ventricle is normal in size and function.  No significant stenosis or regurgitation seen  There is no pericardial effusion.  No old study suitable for comparison"  Has PAD, with an ulcer on his foot.     He does report chest pains.  He describes pain on both sides of his chest.  No triggers that he can think of.  Pain lasts for 5-10 minutes, occurs once a month.  Walking is difficult due to feet problems.  Most strenuous activity is housework.  Girlfriend has CP and is unable.   He had a cath a few years ago and had minimal disease in 2015:  1. "Normal left main coronary artery. 2.  Mild disease in the proximal left anterior descending artery without significant obstructive disease.  Patent  branches. 3. Normal left circumflex artery and its branches. 4. Normal right coronary artery. 5. Normal left ventricular systolic function.  LVEDP 12  mmHg.  Ejection fraction 60 %."   Past Medical History:  Diagnosis Date  . Allergy    seasonal allergies  . Anemia    on meds  . Diabetes mellitus type II, uncontrolled ([place])    on meds  . Glaucoma    right   . Hyperlipidemia    on meds  . Hypertension    on meds  . Other and unspecified angina pectoris   . Tobacco use     Past Surgical History:  Procedure Laterality Date   . AMPUTATION Left 08/22/2016   Procedure: GREAT TOE AMPUTATION;  Surgeon: Joseph Minion, MD;  Location: [facility];  Service: Orthopedics;  Laterality: Left;  . AMPUTATION Right 05/28/2017   Procedure: AMPUTATION 1st & 3rd TOE;  Surgeon: Joseph Minion, MD;  Location: [facility];  Service: Orthopedics;  Laterality: Right;  . EYE SURGERY     Both Eye Lasik   . IRRIGATION AND DEBRIDEMENT FOOT Left 06/29/2019   Procedure: IRRIGATION AND DEBRIDEMENT FOOT application wound vac;  Surgeon: [name], DPM;  Location: [facility];  Service: Podiatry;  Laterality: Left;  . LEFT HEART CATHETERIZATION WITH CORONARY ANGIOGRAM N/A 07/31/2013   Procedure: LEFT HEART CATHETERIZATION WITH CORONARY ANGIOGRAM;  Surgeon: Joseph Booze, MD;  Location: [facility] C[facility];  Service: Cardiovascular;  Laterality: N/A;  . spinal tap    . toe amputated       Current Outpatient Medications  Medication Sig Dispense Refill  . Accu-Chek FastClix Lancets MISC 1 Package by Does not apply route as directed. Use as instructed to test blood sugar 2 times daily E11.65 100 each 12  . amLODipine (NORVASC) 10 MG tablet Take 1 tablet (10 mg total) by mouth daily. 90 tablet  3  . aspirin EC 81 MG tablet Take 1 tablet (81 mg total) by mouth daily. 90 tablet 1  . atorvastatin (LIPITOR) 20 MG tablet Take 1 tablet (20 mg total) by mouth daily. 90 tablet 1  . b complex vitamins tablet Take 1 tablet by mouth daily. 100 tablet 3  . blood glucose meter kit and supplies KIT Dispense based on patient and insurance preference. Use up to four times daily as directed. (FOR ICD-9 250.00, 250.01). 1 each 0  . Blood Pressure Monitor DEVI Please provide patient with insurance approved blood pressure monitor. I10.0 1 each 0  . buPROPion (WELLBUTRIN SR) 150 MG 12 hr tablet Take 1 tablet (150 mg total) by mouth daily. 90 tablet 0  . carvedilol (COREG) 25 MG tablet Take 1 tablet (25 mg total) by mouth 2 (two) times daily. 180 tablet 1  . cetirizine (ZYRTEC) 10 MG  tablet Take 1 tablet (10 mg total) by mouth daily. Prn itching 30 tablet 11  . Cholecalciferol (VITAMIN D3) 50 MCG (2000 UT) capsule Take 1 capsule (2,000 Units total) by mouth daily. 100 capsule 2  . cloNIDine (CATAPRES) 0.1 MG tablet Take 1 tablet (0.1 mg total) by mouth 2 (two) times daily. (Patient taking differently: Take 0.1 mg by mouth daily.) 60 tablet 11  . escitalopram (LEXAPRO) 10 MG tablet Take 1 tablet (10 mg total) by mouth at bedtime. 90 tablet 1  . ferrous sulfate 325 (65 FE) MG tablet Take 1 tablet (325 mg total) by mouth daily. 90 tablet 1  . gabapentin (NEURONTIN) 300 MG capsule Take 1 capsule (300 mg total) by mouth 3 (three) times daily. Patient needs office visit before refills will be given 270 capsule 3  . glucose blood test strip Use as instructed to test blood sugar 2 times daily E11.65 100 each 12  . insulin glargine (LANTUS SOLOSTAR) 100 UNIT/ML Solostar Pen Inject 15 Units into the skin 2 (two) times daily. 27 mL 0  . insulin lispro (HUMALOG KWIKPEN) 100 UNIT/ML KwikPen For blood sugars 0-150 give 0 units of insulin, 151-200 give 2 units of insulin, 201-250 give 4 units, 251-300 give 6 units, 301-350 give 8 units, 351-400 give 10 units,> 400 give 12 units and call M.D. Discussed hypoglycemia protocol. 15 mL 5  . Insulin Pen Needle 32G X 4 MM MISC Use as instructed. Inject into the skin three time daily. 100 each 1  . lisinopril (ZESTRIL) 10 MG tablet Take 1 tablet (10 mg total) by mouth daily. 90 tablet 3  . Misc. Devices MISC Please provide patient with insurance approved diabetic shoes. E11.65 1 each 0  . traMADol (ULTRAM) 50 MG tablet Take 1 tablet (50 mg total) by mouth every 8 (eight) hours as needed. 6 tablet 0   No current facility-administered medications for this visit.    Allergies:   Bee venom, Penicillins, and Clonidine derivatives    Social History:  The patient  reports that he has been smoking cigarettes. He has been smoking about 0.00 packs per day for  the past 20.00 years. He has never used smokeless tobacco. He reports previous alcohol use. He reports that he does not use drugs.   Family History:  The patient's family history includes Cancer in his mother; Diabetes in his father; Hypertension in his father; Lung cancer (age of onset: 87) in his mother.    ROS:  Please see the history of present illness.   Otherwise, review of systems are positive for chest pain.  All other systems are reviewed and negative.    PHYSICAL EXAM: VS:  BP (!) 160/94   Pulse 82   Ht 5' 5.5" (1.664 m)   Wt 201 lb 6.4 oz (91.4 kg)   SpO2 93%   BMI 33.00 kg/m  , BMI Body mass index is 33 kg/m. GEN: Well nourished, well developed, in no acute distress  HEENT: normal  Neck: no JVD, carotid bruits, or masses Cardiac: RRR; no murmurs, rubs, or gallops,no edema  Respiratory:  clear to auscultation bilaterally, normal work of breathing GI: soft, nontender, nondistended, + BS, obese MS: no deformity or atrophy  Skin: warm and dry, no rash Neuro:  Strength and sensation are intact Psych: euthymic mood, full affect   EKG:   The ekg ordered today demonstrates normal ECG   Recent Labs: 06/29/2019: TSH 1.770 02/12/2020: ALT 11; BUN 21; Creatinine, Ser 2.79; Potassium 4.4; Sodium 138 03/03/2020: Hemoglobin 10.6; Platelets 301   Lipid Panel    Component Value Date/Time   CHOL 97 (L) 10/10/2019 0958   TRIG 43 10/10/2019 0958   HDL 47 10/10/2019 0958   CHOLHDL 2.1 10/10/2019 0958   CHOLHDL 3 10/17/2018 1441   VLDL 18.0 10/17/2018 1441   LDLCALC 39 10/10/2019 0958     Other studies Reviewed: Additional studies/ records that were reviewed today with results demonstrating: *cath results reviewed- labs reviewed.   ASSESSMENT AND PLAN:  1.   CAD: Minimal in 2015.  Atypical chest pain.  Plan for stress test.  Will try exercise cardiolite. Otherwise can change to [place].  Would only cath is high risk stress test and sx worsened.  2.   DM: 8.7 A1C.  Whole  food, [name] based diet.  3.   Foot ulcer: Follows with podiatry.  Prior amputations due to infected toes.  4.   HTN:  High today.  ACE-I limited by CRI.  BP has been high at home.  increase clonidine to 0.1 BID.  5.   CRI: DM, HTN.  Avoid nephrotoxins. ACE-I dose limited.  6.   TObacco abuse: needs to stop smoking.  Has failed with patches.  He has some pills to try.  Looks like wellbutrin.    Current medicines are reviewed at length with the patient today.  The patient concerns regarding his medicines were addressed.  The following changes have been made:  No change  Labs/ tests ordered today include: stress test No orders of the defined types were placed in this encounter.   Recommend 150 minutes/week of aerobic exercise Low fat, low carb, high fiber diet recommended  Disposition:   FU in 1 year   Signed, Larae Grooms, MD  03/06/2020 10:09 AM    [facility] [address], [place], [place]  [address] Phone: [phone]; Fax: [phone]

## 2020-03-12 ENCOUNTER — Telehealth: Payer: Self-pay

## 2020-03-12 NOTE — Telephone Encounter (Signed)
Spoke with the patient, detailed instructions given. He stated that he understood and would be here for his test. Asked to call back with any questions. S.Carleta Woodrow EMTP 

## 2020-03-14 ENCOUNTER — Other Ambulatory Visit (HOSPITAL_COMMUNITY)
Admission: RE | Admit: 2020-03-14 | Discharge: 2020-03-14 | Disposition: A | Discharge status: Home / Self Care | Payer: Medicaid Other | Source: Ambulatory Visit | Attending: Interventional Cardiology | Admitting: Interventional Cardiology

## 2020-03-14 DIAGNOSIS — Z01812 Encounter for preprocedural laboratory examination: Secondary | ICD-10-CM | POA: Insufficient documentation

## 2020-03-14 DIAGNOSIS — Z20822 Contact with and (suspected) exposure to covid-19: Secondary | ICD-10-CM | POA: Insufficient documentation

## 2020-03-15 LAB — SARS CORONAVIRUS 2 (TAT 6-24 HRS): SARS Coronavirus 2: NEGATIVE

## 2020-03-18 ENCOUNTER — Other Ambulatory Visit: Payer: Self-pay

## 2020-03-18 ENCOUNTER — Ambulatory Visit (HOSPITAL_COMMUNITY): Payer: Medicaid Other | Attending: Cardiology

## 2020-03-18 DIAGNOSIS — I25119 Atherosclerotic heart disease of native coronary artery with unspecified angina pectoris: Secondary | ICD-10-CM | POA: Diagnosis not present

## 2020-03-18 DIAGNOSIS — I10 Essential (primary) hypertension: Secondary | ICD-10-CM | POA: Diagnosis not present

## 2020-03-18 LAB — MYOCARDIAL PERFUSION IMAGING
LV dias vol: 61 mL (ref 62–150)
LV sys vol: 28 mL
Peak HR: 113 {beats}/min
Rest HR: 104 {beats}/min
SDS: 1
SRS: 0
SSS: 1
TID: 0.95

## 2020-03-18 MED ORDER — TECHNETIUM TC 99M TETROFOSMIN IV KIT
30.5000 | PACK | Freq: Once | INTRAVENOUS | Status: AC | PRN
  Administered 2020-03-18: 30.5 via INTRAVENOUS
  Filled 2020-03-18: qty 31

## 2020-03-18 MED ORDER — REGADENOSON 0.4 MG/5ML IV SOLN
0.4000 mg | Freq: Once | INTRAVENOUS | Status: AC
  Administered 2020-03-18: 0.4 mg via INTRAVENOUS

## 2020-03-18 MED ORDER — TECHNETIUM TC 99M TETROFOSMIN IV KIT
10.5000 | PACK | Freq: Once | INTRAVENOUS | Status: AC | PRN
  Administered 2020-03-18: 10.5 via INTRAVENOUS
  Filled 2020-03-18: qty 11

## 2020-03-19 ENCOUNTER — Ambulatory Visit (INDEPENDENT_AMBULATORY_CARE_PROVIDER_SITE_OTHER): Payer: Medicaid Other | Admitting: Podiatry

## 2020-03-19 ENCOUNTER — Other Ambulatory Visit: Payer: Self-pay | Admitting: Podiatry

## 2020-03-19 ENCOUNTER — Ambulatory Visit (INDEPENDENT_AMBULATORY_CARE_PROVIDER_SITE_OTHER): Payer: Medicaid Other

## 2020-03-19 VITALS — Temp 99.0°F

## 2020-03-19 DIAGNOSIS — E0843 Diabetes mellitus due to underlying condition with diabetic autonomic (poly)neuropathy: Secondary | ICD-10-CM | POA: Diagnosis not present

## 2020-03-19 DIAGNOSIS — L97512 Non-pressure chronic ulcer of other part of right foot with fat layer exposed: Secondary | ICD-10-CM

## 2020-03-19 DIAGNOSIS — M79671 Pain in right foot: Secondary | ICD-10-CM

## 2020-03-19 DIAGNOSIS — M869 Osteomyelitis, unspecified: Secondary | ICD-10-CM

## 2020-03-19 DIAGNOSIS — L97511 Non-pressure chronic ulcer of other part of right foot limited to breakdown of skin: Secondary | ICD-10-CM | POA: Diagnosis not present

## 2020-03-19 MED ORDER — CIPROFLOXACIN HCL 500 MG PO TABS: 500.0000 mg | ORAL_TABLET | Freq: Two times a day (BID) | ORAL | 0 refills | Status: DC

## 2020-03-19 MED ORDER — SULFAMETHOXAZOLE-TRIMETHOPRIM 800-160 MG PO TABS: 1.0000 | ORAL_TABLET | Freq: Two times a day (BID) | ORAL | 0 refills | Status: DC

## 2020-03-19 NOTE — Progress Notes (Signed)
   Subjective:  49 y.o. male with PMHx of diabetes mellitus PMHx great toe amputations bilateral feet. He is actively being treated for bilateral foot wounds to the fifth metatarsals bilateral. Patient states that recently he has noticed increased pain associated to the right fifth toe area. He presents for further treatment and evaluation.    Past Medical History:  Diagnosis Date  . Allergy    seasonal allergies  . Anemia    on meds  . Diabetes mellitus type II, uncontrolled ([place])    on meds  . Glaucoma    right   . Hyperlipidemia    on meds  . Hypertension    on meds  . Other and unspecified angina pectoris   . Tobacco use       Objective/Physical Exam General: The patient is alert and oriented x3 in no acute distress.  Dermatology:  Wound #1 noted to the RT fifth MTPJ measuring 2.0x2.0x1.0 cm (LxWxD).   To the noted ulceration(s), there is no eschar. Heavy necrotic debris with strong malodor. Wound probes to bone. Skin is warm bilateral lower extremities.  Vascular: Palpable pedal pulses bilaterally. No significant edema or erythema noted. Capillary refill within normal limits.  Neurological: Epicritic and protective threshold diminished bilaterally.   Musculoskeletal Exam: H/o bilateral great toe amputations. Absence of the RT 3rd toe also noted.   Radiographic exam: Cortical erosion noted to the lateral aspect of the fifth metatarsal head consistent with osteomyelitis of the right foot. Abscence of the great toe and 3rd toe of the right foot at the MTPJ. Dorsal dislocation of the  2nd toe at the MTPJ noted.   Assessment: 1. Ulcer RT fifth MTPJ secondary to diabetes mellitus 2. diabetes mellitus w/ peripheral neuropathy 3. OM RT fifth metatarsal head  4. H/o b/l toe amputations  Plan of Care:  1. Patient was evaluated. 2. medically necessary excisional debridement including muscle and deep fascial tissue was performed using a tissue nipper and a chisel blade.  Excisional debridement of all the necrotic nonviable tissue down to healthy bleeding viable tissue was performed with post-debridement measurements same as pre-. 3. the wound was cleansed and dry sterile dressing applied. 4. Recommend betadine daily.  5. Post surgical shoe dispensed.  6. Today we discussed the conservative versus surgical management of the presenting pathology. The patient opts for surgical management. All possible complications and details of the procedure were explained. All patient questions were answered. No guarantees were expressed or implied. 7. Authorization for surgery was initiated today. Surgery will consist of transmetatarsal amputation right foot. Surgery will be scheduled for next Thursday, 03/27/20, with Dr. March Rummage.  8. Cultures taken and sent to pathology  9. Rx Bactrim DS and Cipro 500mg  BID #20 10. Strongly recommended immediately going to the ED if patient begins to experience symptoms associated to increasing infection.  11. RTC 1 week postop    Edrick Kins, DPM [facility]  Dr. Edrick Kins, DPM    [address], [place] [address]                Office [phone]  Fax [phone]

## 2020-03-20 ENCOUNTER — Telehealth: Payer: Self-pay

## 2020-03-20 NOTE — Telephone Encounter (Signed)
DOS 03/27/2020  TRANSMETATARSAL AMPUTATION RT FOOT - 28805  RECEIVED AUTHORIZATION FAX FROM [facility] - NO AUTH REQUIRED FOR CPT [phone], REF [id number]

## 2020-03-22 LAB — WOUND CULTURE
MICRO NUMBER:: 11348760
SPECIMEN QUALITY:: ADEQUATE

## 2020-03-22 LAB — CLIENT EDUCATION TRACKING

## 2020-03-24 ENCOUNTER — Telehealth: Payer: Self-pay | Admitting: Interventional Cardiology

## 2020-03-24 NOTE — Telephone Encounter (Signed)
Patient was just seen on 03/06/20 and had a normal stress test on 03/18/20. Will forward to Dr. Irish Lack to see if he is cleared for surgery and if he can hold ASA.

## 2020-03-24 NOTE — Telephone Encounter (Signed)
   Primary Cardiologist: Larae Grooms, MD  Chart reviewed as part of pre-operative protocol coverage. Given past medical history and time since last visit, based on ACC/AHA guidelines, Arlind Klingerman Hon would be at acceptable risk for the planned procedure without further cardiovascular testing. He was last seen 03/06/20 with atypical chest pain and subsequent gated lexiscan was normal with no evidence of ischemia.   Await recommendations regarding holding Aspirin prior to transmetatarsal amputation of the right foot from his primary cardiologist, Dr. Irish Lack.   Loel Dubonnet, NP 03/24/2020, 3:16 PM

## 2020-03-24 NOTE — Telephone Encounter (Signed)
° °  [facility] Pre-operative Risk Assessment    HEARTCARE STAFF: - Please ensure there is not already an duplicate clearance open for this procedure. - Under Visit Info/Reason for Call, type in Other and utilize the format Clearance MM/DD/YY or Clearance TBD. Do not use dashes or single digits. - If request is for dental extraction, please clarify the # of teeth to be extracted.  Request for surgical clearance:  1. What type of surgery is being performed? Transmetatarsal amputation of the right foot  2. When is this surgery scheduled? 03/27/2020   3. What type of clearance is required (medical clearance vs. Pharmacy clearance to hold med vs. Both)? Medical  4. Are there any medications that need to be held prior to surgery and how long?Asa 20m   5. Practice name and name of physician performing surgery? Dr MHardie Pulley [facility]  6. What is the office phone number? [phone]   7.   What is the office fax number? [phone] 8.   Anesthesia type (None, local, MAC, general) ? Choice   Joseph Slovak12/27/2021, 11:16 AM  _________________________________________________________________   (provider comments below)

## 2020-03-24 NOTE — Telephone Encounter (Signed)
See clearance phone note that was already open.

## 2020-03-24 NOTE — Telephone Encounter (Signed)
Patient is calling back with questions regarding his medical clearance for surgery on 03/27/20. Please advise.

## 2020-03-24 NOTE — Telephone Encounter (Signed)
Patient called the office to follow up on clearance request, see other phone note from today. I spoke with him and made him aware that we are waiting to hear back from Dr Irish Lack in regards to holding his Asa 81 mg prior to his procedure. He verbalized his understanding and appreciation.

## 2020-03-24 NOTE — Telephone Encounter (Signed)
Pt called in and stated that he dropped off a surgical clearance on 12/23 for foot surgery. He would like to know the status.  He is having surgery on 12/30 and they need it back as soon as possible.    Fax number was on the paper.  He did not have the info with him   Best number [phone]

## 2020-03-24 NOTE — Telephone Encounter (Signed)
Spoke with Darrick Penna, surgery coordinator at [facility] and she states that the patient needs a history and physical filled out prior to patients upcoming procedure on 03/27/2020. She states that she informed the patient that he needed to take the form to his primary care providers office to be completed however the patient says that his primary care told him to bring the form to our office for completion. Please advise. Thanks, MI

## 2020-03-25 ENCOUNTER — Other Ambulatory Visit: Payer: Self-pay | Admitting: Podiatry

## 2020-03-25 ENCOUNTER — Inpatient Hospital Stay (HOSPITAL_COMMUNITY): Payer: Medicaid Other | Admitting: Certified Registered Nurse Anesthetist

## 2020-03-25 ENCOUNTER — Inpatient Hospital Stay (HOSPITAL_COMMUNITY)
Admission: EM | Admit: 2020-03-25 | Discharge: 2020-04-08 | DRG: 239 | Disposition: A | Discharge status: Home Health | Payer: Medicaid Other | Attending: Internal Medicine | Admitting: Internal Medicine

## 2020-03-25 ENCOUNTER — Inpatient Hospital Stay (HOSPITAL_COMMUNITY): Payer: Medicaid Other

## 2020-03-25 ENCOUNTER — Ambulatory Visit: Payer: Self-pay | Admitting: Podiatry

## 2020-03-25 ENCOUNTER — Encounter (HOSPITAL_COMMUNITY): Payer: Self-pay

## 2020-03-25 ENCOUNTER — Ambulatory Visit (INDEPENDENT_AMBULATORY_CARE_PROVIDER_SITE_OTHER): Payer: Medicaid Other | Admitting: Podiatry

## 2020-03-25 ENCOUNTER — Other Ambulatory Visit: Payer: Self-pay

## 2020-03-25 ENCOUNTER — Encounter (HOSPITAL_COMMUNITY)
Admission: EM | Disposition: A | Discharge status: Home Health | Payer: Self-pay | Source: Home / Self Care | Attending: Internal Medicine

## 2020-03-25 ENCOUNTER — Ambulatory Visit (INDEPENDENT_AMBULATORY_CARE_PROVIDER_SITE_OTHER): Payer: Medicaid Other

## 2020-03-25 VITALS — Temp 98.7°F

## 2020-03-25 DIAGNOSIS — M86171 Other acute osteomyelitis, right ankle and foot: Secondary | ICD-10-CM | POA: Diagnosis not present

## 2020-03-25 DIAGNOSIS — E0843 Diabetes mellitus due to underlying condition with diabetic autonomic (poly)neuropathy: Secondary | ICD-10-CM

## 2020-03-25 DIAGNOSIS — L97422 Non-pressure chronic ulcer of left heel and midfoot with fat layer exposed: Secondary | ICD-10-CM | POA: Diagnosis not present

## 2020-03-25 DIAGNOSIS — F32A Depression, unspecified: Secondary | ICD-10-CM | POA: Diagnosis present

## 2020-03-25 DIAGNOSIS — E1149 Type 2 diabetes mellitus with other diabetic neurological complication: Secondary | ICD-10-CM | POA: Diagnosis present

## 2020-03-25 DIAGNOSIS — Z9889 Other specified postprocedural states: Secondary | ICD-10-CM | POA: Diagnosis not present

## 2020-03-25 DIAGNOSIS — Z794 Long term (current) use of insulin: Secondary | ICD-10-CM | POA: Diagnosis not present

## 2020-03-25 DIAGNOSIS — N179 Acute kidney failure, unspecified: Secondary | ICD-10-CM | POA: Diagnosis not present

## 2020-03-25 DIAGNOSIS — H409 Unspecified glaucoma: Secondary | ICD-10-CM | POA: Diagnosis present

## 2020-03-25 DIAGNOSIS — D75839 Thrombocytosis, unspecified: Secondary | ICD-10-CM | POA: Diagnosis not present

## 2020-03-25 DIAGNOSIS — E1122 Type 2 diabetes mellitus with diabetic chronic kidney disease: Secondary | ICD-10-CM | POA: Diagnosis present

## 2020-03-25 DIAGNOSIS — I2510 Atherosclerotic heart disease of native coronary artery without angina pectoris: Secondary | ICD-10-CM | POA: Diagnosis present

## 2020-03-25 DIAGNOSIS — E1159 Type 2 diabetes mellitus with other circulatory complications: Secondary | ICD-10-CM | POA: Diagnosis not present

## 2020-03-25 DIAGNOSIS — Z833 Family history of diabetes mellitus: Secondary | ICD-10-CM

## 2020-03-25 DIAGNOSIS — E11628 Type 2 diabetes mellitus with other skin complications: Secondary | ICD-10-CM

## 2020-03-25 DIAGNOSIS — N183 Chronic kidney disease, stage 3 unspecified: Secondary | ICD-10-CM | POA: Diagnosis present

## 2020-03-25 DIAGNOSIS — B957 Other staphylococcus as the cause of diseases classified elsewhere: Secondary | ICD-10-CM | POA: Diagnosis present

## 2020-03-25 DIAGNOSIS — E1165 Type 2 diabetes mellitus with hyperglycemia: Secondary | ICD-10-CM | POA: Diagnosis present

## 2020-03-25 DIAGNOSIS — M869 Osteomyelitis, unspecified: Secondary | ICD-10-CM

## 2020-03-25 DIAGNOSIS — N1832 Chronic kidney disease, stage 3b: Secondary | ICD-10-CM | POA: Diagnosis present

## 2020-03-25 DIAGNOSIS — M868X7 Other osteomyelitis, ankle and foot: Secondary | ICD-10-CM | POA: Diagnosis present

## 2020-03-25 DIAGNOSIS — L97519 Non-pressure chronic ulcer of other part of right foot with unspecified severity: Secondary | ICD-10-CM | POA: Diagnosis present

## 2020-03-25 DIAGNOSIS — M216X1 Other acquired deformities of right foot: Secondary | ICD-10-CM

## 2020-03-25 DIAGNOSIS — F17210 Nicotine dependence, cigarettes, uncomplicated: Secondary | ICD-10-CM | POA: Diagnosis present

## 2020-03-25 DIAGNOSIS — E785 Hyperlipidemia, unspecified: Secondary | ICD-10-CM | POA: Diagnosis present

## 2020-03-25 DIAGNOSIS — L97514 Non-pressure chronic ulcer of other part of right foot with necrosis of bone: Secondary | ICD-10-CM | POA: Diagnosis not present

## 2020-03-25 DIAGNOSIS — A480 Gas gangrene: Secondary | ICD-10-CM

## 2020-03-25 DIAGNOSIS — J982 Interstitial emphysema: Secondary | ICD-10-CM | POA: Diagnosis present

## 2020-03-25 DIAGNOSIS — M8580 Other specified disorders of bone density and structure, unspecified site: Secondary | ICD-10-CM

## 2020-03-25 DIAGNOSIS — Z20822 Contact with and (suspected) exposure to covid-19: Secondary | ICD-10-CM | POA: Diagnosis present

## 2020-03-25 DIAGNOSIS — E1169 Type 2 diabetes mellitus with other specified complication: Secondary | ICD-10-CM | POA: Diagnosis present

## 2020-03-25 DIAGNOSIS — Z79899 Other long term (current) drug therapy: Secondary | ICD-10-CM

## 2020-03-25 DIAGNOSIS — L089 Local infection of the skin and subcutaneous tissue, unspecified: Secondary | ICD-10-CM | POA: Diagnosis not present

## 2020-03-25 DIAGNOSIS — E1152 Type 2 diabetes mellitus with diabetic peripheral angiopathy with gangrene: Secondary | ICD-10-CM | POA: Diagnosis present

## 2020-03-25 DIAGNOSIS — E11621 Type 2 diabetes mellitus with foot ulcer: Secondary | ICD-10-CM | POA: Diagnosis present

## 2020-03-25 DIAGNOSIS — T148XXA Other injury of unspecified body region, initial encounter: Secondary | ICD-10-CM | POA: Diagnosis present

## 2020-03-25 DIAGNOSIS — I10 Essential (primary) hypertension: Secondary | ICD-10-CM | POA: Diagnosis not present

## 2020-03-25 DIAGNOSIS — D72829 Elevated white blood cell count, unspecified: Secondary | ICD-10-CM | POA: Diagnosis not present

## 2020-03-25 DIAGNOSIS — E1142 Type 2 diabetes mellitus with diabetic polyneuropathy: Secondary | ICD-10-CM | POA: Diagnosis present

## 2020-03-25 DIAGNOSIS — IMO0002 Reserved for concepts with insufficient information to code with codable children: Secondary | ICD-10-CM | POA: Diagnosis present

## 2020-03-25 DIAGNOSIS — E871 Hypo-osmolality and hyponatremia: Secondary | ICD-10-CM | POA: Diagnosis not present

## 2020-03-25 DIAGNOSIS — L02611 Cutaneous abscess of right foot: Secondary | ICD-10-CM | POA: Diagnosis present

## 2020-03-25 DIAGNOSIS — Z89421 Acquired absence of other right toe(s): Secondary | ICD-10-CM | POA: Diagnosis not present

## 2020-03-25 DIAGNOSIS — Z89412 Acquired absence of left great toe: Secondary | ICD-10-CM | POA: Diagnosis not present

## 2020-03-25 DIAGNOSIS — D509 Iron deficiency anemia, unspecified: Secondary | ICD-10-CM | POA: Diagnosis present

## 2020-03-25 DIAGNOSIS — R339 Retention of urine, unspecified: Secondary | ICD-10-CM | POA: Diagnosis not present

## 2020-03-25 DIAGNOSIS — Z8249 Family history of ischemic heart disease and other diseases of the circulatory system: Secondary | ICD-10-CM | POA: Diagnosis not present

## 2020-03-25 DIAGNOSIS — I313 Pericardial effusion (noninflammatory): Secondary | ICD-10-CM | POA: Diagnosis present

## 2020-03-25 DIAGNOSIS — L97509 Non-pressure chronic ulcer of other part of unspecified foot with unspecified severity: Secondary | ICD-10-CM | POA: Diagnosis present

## 2020-03-25 DIAGNOSIS — R7881 Bacteremia: Secondary | ICD-10-CM | POA: Diagnosis present

## 2020-03-25 DIAGNOSIS — D631 Anemia in chronic kidney disease: Secondary | ICD-10-CM | POA: Diagnosis present

## 2020-03-25 DIAGNOSIS — L03115 Cellulitis of right lower limb: Secondary | ICD-10-CM | POA: Diagnosis present

## 2020-03-25 DIAGNOSIS — R1110 Vomiting, unspecified: Secondary | ICD-10-CM

## 2020-03-25 DIAGNOSIS — I152 Hypertension secondary to endocrine disorders: Secondary | ICD-10-CM | POA: Diagnosis present

## 2020-03-25 DIAGNOSIS — G629 Polyneuropathy, unspecified: Secondary | ICD-10-CM

## 2020-03-25 DIAGNOSIS — Z7982 Long term (current) use of aspirin: Secondary | ICD-10-CM

## 2020-03-25 DIAGNOSIS — Z801 Family history of malignant neoplasm of trachea, bronchus and lung: Secondary | ICD-10-CM

## 2020-03-25 DIAGNOSIS — K529 Noninfective gastroenteritis and colitis, unspecified: Secondary | ICD-10-CM | POA: Diagnosis not present

## 2020-03-25 DIAGNOSIS — N1831 Chronic kidney disease, stage 3a: Secondary | ICD-10-CM | POA: Diagnosis present

## 2020-03-25 DIAGNOSIS — N184 Chronic kidney disease, stage 4 (severe): Secondary | ICD-10-CM | POA: Diagnosis not present

## 2020-03-25 DIAGNOSIS — E11649 Type 2 diabetes mellitus with hypoglycemia without coma: Secondary | ICD-10-CM | POA: Diagnosis not present

## 2020-03-25 LAB — PREALBUMIN: Prealbumin: 10.5 mg/dL — ABNORMAL LOW (ref 18–38)

## 2020-03-25 LAB — COMPREHENSIVE METABOLIC PANEL
ALT: 28 U/L (ref 0–44)
AST: 14 U/L — ABNORMAL LOW (ref 15–41)
Albumin: 2.9 g/dL — ABNORMAL LOW (ref 3.5–5.0)
Alkaline Phosphatase: 83 U/L (ref 38–126)
Anion gap: 9 (ref 5–15)
BUN: 29 mg/dL — ABNORMAL HIGH (ref 6–20)
CO2: 19 mmol/L — ABNORMAL LOW (ref 22–32)
Calcium: 8.6 mg/dL — ABNORMAL LOW (ref 8.9–10.3)
Chloride: 110 mmol/L (ref 98–111)
Creatinine, Ser: 2.58 mg/dL — ABNORMAL HIGH (ref 0.61–1.24)
GFR, Estimated: 29 mL/min — ABNORMAL LOW (ref >60)
Glucose, Bld: 193 mg/dL — ABNORMAL HIGH (ref 70–99)
Potassium: 3.9 mmol/L (ref 3.5–5.1)
Sodium: 138 mmol/L (ref 135–145)
Total Bilirubin: 0.3 mg/dL (ref 0.3–1.2)
Total Protein: 7.9 g/dL (ref 6.5–8.1)

## 2020-03-25 LAB — CBC WITH DIFFERENTIAL/PLATELET
Abs Immature Granulocytes: 0.29 10*3/uL — ABNORMAL HIGH (ref 0.00–0.07)
Basophils Absolute: 0 10*3/uL (ref 0.0–0.1)
Basophils Relative: 0 %
Eosinophils Absolute: 0.3 10*3/uL (ref 0.0–0.5)
Eosinophils Relative: 2 %
HCT: 30.1 % — ABNORMAL LOW (ref 39.0–52.0)
Hemoglobin: 9.4 g/dL — ABNORMAL LOW (ref 13.0–17.0)
Immature Granulocytes: 2 %
Lymphocytes Relative: 17 %
Lymphs Abs: 3.1 10*3/uL (ref 0.7–4.0)
MCH: 27.2 pg (ref 26.0–34.0)
MCHC: 31.2 g/dL (ref 30.0–36.0)
MCV: 87 fL (ref 80.0–100.0)
Monocytes Absolute: 1.1 10*3/uL — ABNORMAL HIGH (ref 0.1–1.0)
Monocytes Relative: 6 %
Neutro Abs: 13.1 10*3/uL — ABNORMAL HIGH (ref 1.7–7.7)
Neutrophils Relative %: 73 %
Platelets: 428 10*3/uL — ABNORMAL HIGH (ref 150–400)
RBC: 3.46 MIL/uL — ABNORMAL LOW (ref 4.22–5.81)
RDW: 14.7 % (ref 11.5–15.5)
WBC: 17.8 10*3/uL — ABNORMAL HIGH (ref 4.0–10.5)
nRBC: 0 % (ref 0.0–0.2)

## 2020-03-25 LAB — RESP PANEL BY RT-PCR (FLU A&B, COVID) ARPGX2
Influenza A by PCR: NEGATIVE
Influenza B by PCR: NEGATIVE
SARS Coronavirus 2 by RT PCR: NEGATIVE

## 2020-03-25 LAB — GLUCOSE, CAPILLARY
Glucose-Capillary: 100 mg/dL — ABNORMAL HIGH (ref 70–99)
Glucose-Capillary: 103 mg/dL — ABNORMAL HIGH (ref 70–99)

## 2020-03-25 LAB — C-REACTIVE PROTEIN: CRP: 5 mg/dL — ABNORMAL HIGH (ref <1.0)

## 2020-03-25 LAB — LACTIC ACID, PLASMA: Lactic Acid, Venous: 1.3 mmol/L (ref 0.5–1.9)

## 2020-03-25 SURGERY — IRRIGATION AND DEBRIDEMENT EXTREMITY
Anesthesia: Monitor Anesthesia Care | Laterality: Right

## 2020-03-25 MED ORDER — METRONIDAZOLE 500 MG PO TABS
500.0000 mg | ORAL_TABLET | Freq: Once | ORAL | Status: AC
  Administered 2020-03-25: 500 mg via ORAL
  Filled 2020-03-25: qty 1

## 2020-03-25 MED ORDER — SODIUM CHLORIDE 0.9 % IV SOLN
2.0000 g | Freq: Once | INTRAVENOUS | Status: AC
  Administered 2020-03-25: 2 g via INTRAVENOUS
  Filled 2020-03-25: qty 2

## 2020-03-25 MED ORDER — ONDANSETRON HCL 4 MG/2ML IJ SOLN
INTRAMUSCULAR | Status: AC
  Filled 2020-03-25: qty 2

## 2020-03-25 MED ORDER — ONDANSETRON HCL 4 MG/2ML IJ SOLN
4.0000 mg | Freq: Four times a day (QID) | INTRAMUSCULAR | Status: DC | PRN
  Administered 2020-03-28 – 2020-04-03 (×4): 4 mg via INTRAVENOUS
  Filled 2020-03-25 (×4): qty 2

## 2020-03-25 MED ORDER — OXYCODONE HCL 5 MG PO TABS: 5.0000 mg | ORAL_TABLET | Freq: Once | ORAL | Status: DC | PRN

## 2020-03-25 MED ORDER — PROPOFOL 500 MG/50ML IV EMUL
INTRAVENOUS | Status: AC
  Filled 2020-03-25: qty 50

## 2020-03-25 MED ORDER — OXYCODONE HCL 5 MG/5ML PO SOLN: 5.0000 mg | Freq: Once | ORAL | Status: DC | PRN

## 2020-03-25 MED ORDER — LACTATED RINGERS IV SOLN: INTRAVENOUS | Status: DC

## 2020-03-25 MED ORDER — NICOTINE 7 MG/24HR TD PT24
7.0000 mg | MEDICATED_PATCH | Freq: Every day | TRANSDERMAL | Status: DC
  Administered 2020-03-27 – 2020-04-08 (×13): 7 mg via TRANSDERMAL
  Filled 2020-03-25 (×14): qty 1

## 2020-03-25 MED ORDER — MIDAZOLAM HCL 5 MG/5ML IJ SOLN
INTRAMUSCULAR | Status: DC | PRN
  Administered 2020-03-25 (×2): 1 mg via INTRAVENOUS

## 2020-03-25 MED ORDER — VANCOMYCIN HCL 1500 MG/300ML IV SOLN
1500.0000 mg | Freq: Once | INTRAVENOUS | Status: AC
  Administered 2020-03-25: 1500 mg via INTRAVENOUS
  Filled 2020-03-25: qty 300

## 2020-03-25 MED ORDER — FERROUS SULFATE 325 (65 FE) MG PO TABS
325.0000 mg | ORAL_TABLET | Freq: Every day | ORAL | Status: DC
  Administered 2020-03-26 – 2020-04-08 (×14): 325 mg via ORAL
  Filled 2020-03-25 (×14): qty 1

## 2020-03-25 MED ORDER — CARVEDILOL 25 MG PO TABS
25.0000 mg | ORAL_TABLET | Freq: Two times a day (BID) | ORAL | Status: DC
  Administered 2020-03-25 – 2020-04-08 (×27): 25 mg via ORAL
  Filled 2020-03-25 (×10): qty 1
  Filled 2020-03-25: qty 2
  Filled 2020-03-25 (×2): qty 1
  Filled 2020-03-25: qty 2
  Filled 2020-03-25 (×9): qty 1
  Filled 2020-03-25: qty 2
  Filled 2020-03-25 (×3): qty 1

## 2020-03-25 MED ORDER — ONDANSETRON HCL 4 MG PO TABS: 4.0000 mg | ORAL_TABLET | Freq: Four times a day (QID) | ORAL | Status: DC | PRN

## 2020-03-25 MED ORDER — HYDRALAZINE HCL 20 MG/ML IJ SOLN
10.0000 mg | INTRAMUSCULAR | Status: DC | PRN
  Administered 2020-03-25: 10 mg via INTRAVENOUS
  Filled 2020-03-25: qty 1

## 2020-03-25 MED ORDER — ASPIRIN EC 81 MG PO TBEC
81.0000 mg | DELAYED_RELEASE_TABLET | Freq: Every day | ORAL | Status: DC
  Administered 2020-03-26 – 2020-04-08 (×14): 81 mg via ORAL
  Filled 2020-03-25 (×14): qty 1

## 2020-03-25 MED ORDER — ACETAMINOPHEN 325 MG PO TABS
650.0000 mg | ORAL_TABLET | Freq: Four times a day (QID) | ORAL | Status: DC | PRN
  Administered 2020-03-26 – 2020-04-02 (×2): 650 mg via ORAL
  Filled 2020-03-25 (×2): qty 2

## 2020-03-25 MED ORDER — PROPOFOL 500 MG/50ML IV EMUL
INTRAVENOUS | Status: DC | PRN
  Administered 2020-03-25: 125 ug/kg/min via INTRAVENOUS

## 2020-03-25 MED ORDER — AMLODIPINE BESYLATE 10 MG PO TABS
10.0000 mg | ORAL_TABLET | Freq: Every day | ORAL | Status: DC
  Administered 2020-03-26 – 2020-04-08 (×13): 10 mg via ORAL
  Filled 2020-03-25 (×14): qty 1

## 2020-03-25 MED ORDER — ESCITALOPRAM OXALATE 10 MG PO TABS
10.0000 mg | ORAL_TABLET | Freq: Every day | ORAL | Status: DC
  Administered 2020-03-25 – 2020-04-07 (×13): 10 mg via ORAL
  Filled 2020-03-25 (×15): qty 1

## 2020-03-25 MED ORDER — ACETAMINOPHEN 650 MG RE SUPP: 650.0000 mg | Freq: Four times a day (QID) | RECTAL | Status: DC | PRN

## 2020-03-25 MED ORDER — CLONIDINE HCL 0.1 MG PO TABS
0.1000 mg | ORAL_TABLET | Freq: Two times a day (BID) | ORAL | Status: DC
  Administered 2020-03-25 – 2020-04-08 (×27): 0.1 mg via ORAL
  Filled 2020-03-25 (×27): qty 1

## 2020-03-25 MED ORDER — VANCOMYCIN HCL 1250 MG/250ML IV SOLN
1250.0000 mg | INTRAVENOUS | Status: DC
  Administered 2020-03-27: 1250 mg via INTRAVENOUS
  Filled 2020-03-25 (×2): qty 250

## 2020-03-25 MED ORDER — GABAPENTIN 300 MG PO CAPS
300.0000 mg | ORAL_CAPSULE | Freq: Three times a day (TID) | ORAL | Status: DC
  Administered 2020-03-25 – 2020-04-08 (×40): 300 mg via ORAL
  Filled 2020-03-25 (×40): qty 1

## 2020-03-25 MED ORDER — B COMPLEX-C PO TABS
1.0000 | ORAL_TABLET | Freq: Every day | ORAL | Status: DC
  Administered 2020-03-26 – 2020-04-08 (×14): 1 via ORAL
  Filled 2020-03-25 (×15): qty 1

## 2020-03-25 MED ORDER — ONDANSETRON HCL 4 MG/2ML IJ SOLN
INTRAMUSCULAR | Status: DC | PRN
  Administered 2020-03-25: 4 mg via INTRAVENOUS

## 2020-03-25 MED ORDER — SODIUM CHLORIDE 0.9 % IV SOLN
2.0000 g | INTRAVENOUS | Status: DC
  Administered 2020-03-25 – 2020-04-01 (×8): 2 g via INTRAVENOUS
  Filled 2020-03-25 (×2): qty 2
  Filled 2020-03-25: qty 20
  Filled 2020-03-25: qty 2
  Filled 2020-03-25: qty 20
  Filled 2020-03-25 (×2): qty 2
  Filled 2020-03-25: qty 20

## 2020-03-25 MED ORDER — BUPROPION HCL ER (SR) 150 MG PO TB12
150.0000 mg | ORAL_TABLET | Freq: Every day | ORAL | Status: DC
  Administered 2020-03-26 – 2020-04-08 (×14): 150 mg via ORAL
  Filled 2020-03-25 (×14): qty 1

## 2020-03-25 MED ORDER — SODIUM CHLORIDE 0.9 % IV SOLN
INTRAVENOUS | Status: AC
  Filled 2020-03-25: qty 20

## 2020-03-25 MED ORDER — PROMETHAZINE HCL 25 MG/ML IJ SOLN: 6.2500 mg | INTRAMUSCULAR | Status: DC | PRN

## 2020-03-25 MED ORDER — SENNOSIDES-DOCUSATE SODIUM 8.6-50 MG PO TABS
1.0000 | ORAL_TABLET | Freq: Two times a day (BID) | ORAL | Status: DC
  Administered 2020-03-25 – 2020-03-28 (×6): 1 via ORAL
  Filled 2020-03-25 (×6): qty 1

## 2020-03-25 MED ORDER — BUPIVACAINE HCL (PF) 0.5 % IJ SOLN
INTRAMUSCULAR | Status: DC | PRN
  Administered 2020-03-25: 20 mL

## 2020-03-25 MED ORDER — INSULIN GLARGINE 100 UNIT/ML ~~LOC~~ SOLN
15.0000 [IU] | Freq: Two times a day (BID) | SUBCUTANEOUS | Status: DC
  Administered 2020-03-26 – 2020-03-30 (×10): 15 [IU] via SUBCUTANEOUS
  Filled 2020-03-25 (×11): qty 0.15

## 2020-03-25 MED ORDER — HYDROMORPHONE HCL 1 MG/ML IJ SOLN
0.5000 mg | INTRAMUSCULAR | Status: DC | PRN
  Administered 2020-03-27 – 2020-04-02 (×4): 0.5 mg via INTRAVENOUS
  Filled 2020-03-25 (×4): qty 0.5

## 2020-03-25 MED ORDER — VANCOMYCIN HCL POWD
Status: DC | PRN
  Administered 2020-03-25: 1000 mg via TOPICAL

## 2020-03-25 MED ORDER — FENTANYL CITRATE (PF) 100 MCG/2ML IJ SOLN: 25.0000 ug | INTRAMUSCULAR | Status: DC | PRN

## 2020-03-25 MED ORDER — VANCOMYCIN HCL 1000 MG IV SOLR
INTRAVENOUS | Status: AC
  Filled 2020-03-25: qty 1000

## 2020-03-25 MED ORDER — OXYCODONE HCL 5 MG PO TABS
5.0000 mg | ORAL_TABLET | ORAL | Status: DC | PRN
  Administered 2020-03-26 – 2020-04-07 (×9): 5 mg via ORAL
  Filled 2020-03-25 (×12): qty 1

## 2020-03-25 MED ORDER — ATORVASTATIN CALCIUM 20 MG PO TABS
20.0000 mg | ORAL_TABLET | Freq: Every day | ORAL | Status: DC
  Administered 2020-03-26 – 2020-04-08 (×14): 20 mg via ORAL
  Filled 2020-03-25 (×14): qty 1

## 2020-03-25 MED ORDER — INSULIN ASPART 100 UNIT/ML ~~LOC~~ SOLN
0.0000 [IU] | SUBCUTANEOUS | Status: DC
  Administered 2020-03-26: 1 [IU] via SUBCUTANEOUS
  Administered 2020-03-26: 2 [IU] via SUBCUTANEOUS
  Administered 2020-03-26 – 2020-03-30 (×10): 1 [IU] via SUBCUTANEOUS
  Administered 2020-03-30: 2 [IU] via SUBCUTANEOUS
  Administered 2020-04-02 (×3): 1 [IU] via SUBCUTANEOUS

## 2020-03-25 MED ORDER — MIDAZOLAM HCL 2 MG/2ML IJ SOLN
INTRAMUSCULAR | Status: AC
  Filled 2020-03-25: qty 2

## 2020-03-25 MED ORDER — METRONIDAZOLE 500 MG PO TABS
500.0000 mg | ORAL_TABLET | Freq: Three times a day (TID) | ORAL | Status: DC
  Administered 2020-03-25 – 2020-04-08 (×40): 500 mg via ORAL
  Filled 2020-03-25 (×41): qty 1

## 2020-03-25 MED ORDER — PROPOFOL 10 MG/ML IV BOLUS
INTRAVENOUS | Status: DC | PRN
  Administered 2020-03-25 (×2): 20 mg via INTRAVENOUS

## 2020-03-25 MED ORDER — MEPERIDINE HCL 50 MG/ML IJ SOLN: 6.2500 mg | INTRAMUSCULAR | Status: DC | PRN

## 2020-03-25 MED ORDER — BUPIVACAINE HCL (PF) 0.5 % IJ SOLN
INTRAMUSCULAR | Status: AC
  Filled 2020-03-25: qty 30

## 2020-03-25 MED ORDER — HEPARIN SODIUM (PORCINE) 5000 UNIT/ML IJ SOLN
5000.0000 [IU] | Freq: Three times a day (TID) | INTRAMUSCULAR | Status: DC
  Administered 2020-03-26 – 2020-04-08 (×38): 5000 [IU] via SUBCUTANEOUS
  Filled 2020-03-25 (×40): qty 1

## 2020-03-25 MED ORDER — METHOCARBAMOL 1000 MG/10ML IJ SOLN: 500.0000 mg | Freq: Four times a day (QID) | INTRAVENOUS | Status: DC | PRN

## 2020-03-25 MED ORDER — MIDAZOLAM HCL 2 MG/2ML IJ SOLN: 0.5000 mg | Freq: Once | INTRAMUSCULAR | Status: DC | PRN

## 2020-03-25 SURGICAL SUPPLY — 57 items
APL PRP STRL LF DISP 70% ISPRP (MISCELLANEOUS) ×1
BLADE SURG 15 STRL LF DISP TIS (BLADE) ×1 IMPLANT
BLADE SURG 15 STRL SS (BLADE) ×2
BNDG CMPR 9X4 STRL LF SNTH (GAUZE/BANDAGES/DRESSINGS) ×1
BNDG ELASTIC 3X5.8 VLCR STR LF (GAUZE/BANDAGES/DRESSINGS) ×2 IMPLANT
BNDG ELASTIC 4X5.8 VLCR STR LF (GAUZE/BANDAGES/DRESSINGS) ×2 IMPLANT
BNDG ELASTIC 6X5.8 VLCR STR LF (GAUZE/BANDAGES/DRESSINGS) ×2 IMPLANT
BNDG ESMARK 4X9 LF (GAUZE/BANDAGES/DRESSINGS) ×2 IMPLANT
BNDG GAUZE ELAST 4 BULKY (GAUZE/BANDAGES/DRESSINGS) ×2 IMPLANT
CHLORAPREP W/TINT 26 (MISCELLANEOUS) ×2 IMPLANT
CNTNR URN SCR LID CUP LEK RST (MISCELLANEOUS) ×1 IMPLANT
CONT SPEC 4OZ STRL OR WHT (MISCELLANEOUS) ×2
COVER BACK TABLE 60X90IN (DRAPES) ×2 IMPLANT
CUFF TOURN SGL QUICK 18X4 (TOURNIQUET CUFF) ×2 IMPLANT
CUFF TOURN SGL QUICK 24 (TOURNIQUET CUFF) ×2
CUFF TRNQT CYL 24X4X16.5-23 (TOURNIQUET CUFF) ×1 IMPLANT
DRAPE 3/4 80X56 (DRAPES) ×2 IMPLANT
DRAPE EXTREMITY T 121X128X90 (DISPOSABLE) ×2 IMPLANT
DRAPE SHEET LG 3/4 BI-LAMINATE (DRAPES) ×2 IMPLANT
DRAPE U-SHAPE 47X51 STRL (DRAPES) ×2 IMPLANT
ELECT REM PT RETURN 15FT ADLT (MISCELLANEOUS) ×2 IMPLANT
GAUZE SPONGE 4X4 12PLY STRL (GAUZE/BANDAGES/DRESSINGS) ×2 IMPLANT
GAUZE XEROFORM 1X8 LF (GAUZE/BANDAGES/DRESSINGS) ×2 IMPLANT
GLOVE BIO SURGEON STRL SZ7.5 (GLOVE) ×2 IMPLANT
GLOVE BIOGEL PI IND STRL 8 (GLOVE) ×1 IMPLANT
GLOVE BIOGEL PI INDICATOR 8 (GLOVE) ×1
GOWN STRL REUS W/ TWL XL LVL3 (GOWN DISPOSABLE) ×1 IMPLANT
GOWN STRL REUS W/TWL XL LVL3 (GOWN DISPOSABLE) ×2
KIT BASIN OR (CUSTOM PROCEDURE TRAY) ×2 IMPLANT
KIT TURNOVER KIT A (KITS) IMPLANT
MANIFOLD NEPTUNE II (INSTRUMENTS) ×2 IMPLANT
NDL HYPO 25X1 1.5 SAFETY (NEEDLE) ×1 IMPLANT
NEEDLE HYPO 25X1 1.5 SAFETY (NEEDLE) ×2 IMPLANT
NS IRRIG 1000ML POUR BTL (IV SOLUTION) ×2 IMPLANT
PADDING CAST ABS 4INX4YD NS (CAST SUPPLIES) ×1
PADDING CAST ABS COTTON 4X4 ST (CAST SUPPLIES) ×1 IMPLANT
PENCIL SMOKE EVAC W/HOLSTER (ELECTROSURGICAL) ×2 IMPLANT
PENCIL SMOKE EVACUATOR (MISCELLANEOUS) IMPLANT
SET IRRIG Y TYPE TUR BLADDER L (SET/KITS/TRAYS/PACK) ×2 IMPLANT
SPONGE LAP 4X18 RFD (DISPOSABLE) ×2 IMPLANT
STAPLER VISISTAT 35W (STAPLE) ×2 IMPLANT
STOCKINETTE 6  STRL (DRAPES) ×2
STOCKINETTE 6 STRL (DRAPES) ×1 IMPLANT
SUCTION FRAZIER HANDLE 10FR (MISCELLANEOUS) ×2
SUCTION TUBE FRAZIER 10FR DISP (MISCELLANEOUS) ×1 IMPLANT
SUT ETHILON 3 0 PS 1 (SUTURE) ×2 IMPLANT
SUT ETHILON 4 0 PS 2 18 (SUTURE) ×2 IMPLANT
SUT MNCRL AB 3-0 PS2 18 (SUTURE) ×2 IMPLANT
SUT MNCRL AB 4-0 PS2 18 (SUTURE) ×2 IMPLANT
SUT VIC AB 2-0 SH 27 (SUTURE) ×2
SUT VIC AB 2-0 SH 27XBRD (SUTURE) ×1 IMPLANT
SYR BULB EAR ULCER 3OZ GRN STR (SYRINGE) ×2 IMPLANT
SYR CONTROL 10ML LL (SYRINGE) ×2 IMPLANT
TRAY PREP A LATEX SAFE STRL (SET/KITS/TRAYS/PACK) ×2 IMPLANT
TUBE IRRIGATION SET MISONIX (TUBING) ×2 IMPLANT
UNDERPAD 30X36 HEAVY ABSORB (UNDERPADS AND DIAPERS) ×2 IMPLANT
YANKAUER SUCT BULB TIP NO VENT (SUCTIONS) ×2 IMPLANT

## 2020-03-25 NOTE — Transfer of Care (Signed)
Immediate Anesthesia Transfer of Care Note  Patient: [name]  Procedure(s) Performed: IRRIGATION AND DEBRIDEMENT OF RIGHT FOOT. AMPUTATION OF FIFTH TOE AND PARTIAL OF FOURTH. (Right )  Patient Location: PACU  Anesthesia Type:MAC  Level of Consciousness: oriented and sedated  Airway & Oxygen Therapy: Patient Spontanous Breathing and Patient connected to face mask oxygen  Post-op Assessment: Report given to RN and Post -op Vital signs reviewed and stable  Post vital signs: Reviewed and stable  Last Vitals:  Vitals Value Taken Time  BP    Temp    Pulse    Resp    SpO2      Last Pain:  Vitals:   03/25/20 1156  PainSc: 6          Complications: No complications documented.

## 2020-03-25 NOTE — ED Provider Notes (Signed)
[facility] DEPT Provider Note   CSN: [id number] Arrival date & time: 03/25/20  1144     History Chief Complaint  Patient presents with   Foot Pain    [name] is a 49 y.o. male.  HPI 49 year old presents with right foot infection. He has had issues with that foot for months, but now the infection is worsening. Is having purulent drainage. No fevers. Has pain with movement, otherwise it's not too bad. Saw podiatry today, and his surgery was bumped up and he was told to come to the ER for admission.  Past Medical History:  Diagnosis Date   Allergy    seasonal allergies   Anemia    on meds   Diabetes mellitus type II, uncontrolled ([place])    on meds   Glaucoma    right    Hyperlipidemia    on meds   Hypertension    on meds   Other and unspecified angina pectoris    Tobacco use     Patient Active Problem List   Diagnosis Date Noted   Type 2 diabetes mellitus with diabetic polyneuropathy, with long-term current use of insulin ([place]) 07/16/2019   Type 2 diabetes mellitus with stage 3a chronic kidney disease, with long-term current use of insulin ([place]) 07/16/2019   Diabetic foot ulcer ([place]) 06/29/2019   CRI (chronic renal insufficiency), stage 3 (moderate) ([place]) 01/24/2019   Obesity 01/24/2019   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[place]) 11/13/2018   Diabetic foot ([place]) 10/17/2018   Non-pressure chronic ulcer of other part of right foot limited to breakdown of skin ([place]) 09/11/2018   Type 2 diabetes mellitus with proliferative retinopathy of both eyes, without long-term current use of insulin ([place]) 07/14/2018   Diabetic infection of left foot ([place]) 07/14/2018   Wound cellulitis 05/22/2018   Osteomyelitis of foot, acute ([place]) 05/21/2018   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 ([place])    Diabetic ulcer of left midfoot associated with diabetes mellitus due to underlying condition, with fat layer exposed ([place])  08/26/2017   B12 deficiency 06/15/2017   Vitamin D deficiency 06/03/2017   Iron deficiency anemia 06/01/2017   Diabetic foot infection ([place])    Osteomyelitis ([place]) 05/25/2017   Vitreous floaters of right eye 09/15/2016   Non compliance w medication regimen 09/15/2016   Depression 09/01/2016   Foot ulcer due to secondary DM ([place]) 08/20/2016   Hidradenitis suppurativa 10/13/2015   Peripheral polyneuropathy 07/07/2015   Coronary atherosclerosis of native coronary artery 08/17/2013   Diabetic retinopathy ([place]) 02/02/2013   Diabetic neuropathy ([place]) 12/01/2012   Hypertension associated with diabetes ([place]) 08/03/2012   Tobacco use disorder 08/03/2012   Erectile dysfunction 08/03/2012   Diabetes mellitus with neurological manifestations, uncontrolled ([place]) 05/26/2006    Past Surgical History:  Procedure Laterality Date   AMPUTATION Left 08/22/2016   Procedure: GREAT TOE AMPUTATION;  Surgeon: [name], MD;  Location: [facility];  Service: Orthopedics;  Laterality: Left;   AMPUTATION Right 05/28/2017   Procedure: AMPUTATION 1st & 3rd TOE;  Surgeon: [name], MD;  Location: [facility];  Service: Orthopedics;  Laterality: Right;   EYE SURGERY     Both Eye Lasik    IRRIGATION AND DEBRIDEMENT FOOT Left 06/29/2019   Procedure: IRRIGATION AND DEBRIDEMENT FOOT application wound vac;  Surgeon: [name], DPM;  Location: [facility];  Service: Podiatry;  Laterality: Left;   LEFT HEART CATHETERIZATION WITH CORONARY ANGIOGRAM N/A 07/31/2013   Procedure:  LEFT HEART CATHETERIZATION WITH CORONARY ANGIOGRAM;  Surgeon: Jettie Booze, MD;  Location: [facility] CATH LAB;  Service: Cardiovascular;  Laterality: N/A;   spinal tap     toe amputated         Family History  Problem Relation Age of Onset   Cancer Mother    Lung cancer Mother 44   Diabetes Father    Hypertension Father    Colon cancer Neg Hx    Colon polyps Neg Hx    Esophageal cancer Neg Hx    Rectal cancer  Neg Hx    Stomach cancer Neg Hx     Social History   Tobacco Use   Smoking status: Current Every Day Smoker    Packs/day: 0.00    Years: 20.00    Pack years: 0.00    Types: Cigarettes   Smokeless tobacco: Never Used   Tobacco comment: wants patches   Vaping Use   Vaping Use: Never used  Substance Use Topics   Alcohol use: Not Currently    Comment: occ   Drug use: No    Home Medications Prior to Admission medications   Medication Sig Start Date End Date Taking? Authorizing Provider  Accu-Chek FastClix Lancets MISC 1 Package by Does not apply route as directed. Use as instructed to test blood sugar 2 times daily E11.65 02/26/20   Gildardo Pounds, NP  amLODipine (NORVASC) 10 MG tablet Take 1 tablet (10 mg total) by mouth daily. 02/26/20   Gildardo Pounds, NP  aspirin EC 81 MG tablet Take 1 tablet (81 mg total) by mouth daily. 02/26/20   Gildardo Pounds, NP  atorvastatin (LIPITOR) 20 MG tablet Take 1 tablet (20 mg total) by mouth daily. 02/26/20 05/26/20  Gildardo Pounds, NP  b complex vitamins tablet Take 1 tablet by mouth daily. 10/18/18   Plotnikov, Evie Lacks, MD  blood glucose meter kit and supplies KIT Dispense based on patient and insurance preference. Use up to four times daily as directed. (FOR ICD-9 250.00, 250.01). 08/25/16   Minus Liberty, MD  Blood Pressure Monitor DEVI Please provide patient with insurance approved blood pressure monitor. I10.0 10/05/19   Gildardo Pounds, NP  buPROPion (WELLBUTRIN SR) 150 MG 12 hr tablet Take 1 tablet (150 mg total) by mouth daily. 02/26/20 05/26/20  Gildardo Pounds, NP  carvedilol (COREG) 25 MG tablet Take 1 tablet (25 mg total) by mouth 2 (two) times daily. 02/26/20 08/24/20  Gildardo Pounds, NP  cetirizine (ZYRTEC) 10 MG tablet Take 1 tablet (10 mg total) by mouth daily. Prn itching 11/01/19   Argentina Donovan, PA-C  Cholecalciferol (VITAMIN D3) 50 MCG (2000 UT) capsule Take 1 capsule (2,000 Units total) by mouth daily.  10/23/19   Charlott Rakes, MD  ciprofloxacin (CIPRO) 500 MG tablet Take 1 tablet (500 mg total) by mouth 2 (two) times daily. 03/19/20   Edrick Kins, DPM  cloNIDine (CATAPRES) 0.1 MG tablet Take 1 tablet (0.1 mg total) by mouth 2 (two) times daily. 03/06/20   Jettie Booze, MD  escitalopram (LEXAPRO) 10 MG tablet Take 1 tablet (10 mg total) by mouth at bedtime. 02/26/20   Gildardo Pounds, NP  ferrous sulfate 325 (65 FE) MG tablet Take 1 tablet (325 mg total) by mouth daily. 02/26/20 08/24/20  Gildardo Pounds, NP  gabapentin (NEURONTIN) 300 MG capsule Take 1 capsule (300 mg total) by mouth 3 (three) times daily. Patient needs office visit before refills will be given  02/26/20   Gildardo Pounds, NP  glucose blood test strip Use as instructed to test blood sugar 2 times daily E11.65 02/26/20   Gildardo Pounds, NP  insulin glargine (LANTUS SOLOSTAR) 100 UNIT/ML Solostar Pen Inject 15 Units into the skin 2 (two) times daily. 02/26/20 05/26/20  Gildardo Pounds, NP  insulin lispro (HUMALOG KWIKPEN) 100 UNIT/ML KwikPen For blood sugars 0-150 give 0 units of insulin, 151-200 give 2 units of insulin, 201-250 give 4 units, 251-300 give 6 units, 301-350 give 8 units, 351-400 give 10 units,> 400 give 12 units and call M.D. Discussed hypoglycemia protocol. 02/26/20   Gildardo Pounds, NP  Insulin Pen Needle 32G X 4 MM MISC Use as instructed. Inject into the skin three time daily. 02/26/20   Gildardo Pounds, NP  lisinopril (ZESTRIL) 10 MG tablet Take 1 tablet (10 mg total) by mouth daily. 02/26/20 05/26/20  Gildardo Pounds, NP  Misc. Devices MISC Please provide patient with insurance approved diabetic shoes. E11.65 01/14/20   Gildardo Pounds, NP  sulfamethoxazole-trimethoprim (BACTRIM DS) 800-160 MG tablet Take 1 tablet by mouth 2 (two) times daily. 03/19/20   Edrick Kins, DPM  traMADol (ULTRAM) 50 MG tablet Take 1 tablet (50 mg total) by mouth every 8 (eight) hours as needed. 01/25/20   Tasia Catchings, Amy V,  PA-C    Allergies    Bee venom, Penicillins, and Clonidine derivatives  Review of Systems   Review of Systems  Constitutional: Negative for fever.  Musculoskeletal: Positive for arthralgias.  Skin: Positive for wound.  All other systems reviewed and are negative.   Physical Exam Updated Vital Signs BP (!) 183/99    Pulse 92    Temp 98.2 F (36.8 C)    Resp 16    SpO2 100%   Physical Exam Vitals and nursing note reviewed.  Constitutional:      Appearance: He is well-developed and well-nourished.  HENT:     Head: Normocephalic and atraumatic.     Right Ear: External ear normal.     Left Ear: External ear normal.     Nose: Nose normal.  Eyes:     General:        Right eye: No discharge.        Left eye: No discharge.  Cardiovascular:     Rate and Rhythm: Normal rate and regular rhythm.     Heart sounds: Normal heart sounds.  Pulmonary:     Effort: Pulmonary effort is normal.     Breath sounds: Normal breath sounds.  Abdominal:     General: There is no distension.  Musculoskeletal:        General: No edema.     Cervical back: Neck supple.     Comments: Right foot is wrapped. It is also swollen with drainage and foul smell from lateral distal foot wound  Skin:    General: Skin is warm and dry.  Neurological:     Mental Status: He is alert.  Psychiatric:        Mood and Affect: Mood is not anxious.     ED Results / Procedures / Treatments   Labs (all labs ordered are listed, but only abnormal results are displayed) Labs Reviewed  CULTURE, BLOOD (ROUTINE X 2)  CULTURE, BLOOD (ROUTINE X 2)  RESP PANEL BY RT-PCR (FLU A&B, COVID) ARPGX2  COMPREHENSIVE METABOLIC PANEL  LACTIC ACID, PLASMA  LACTIC ACID, PLASMA  CBC WITH DIFFERENTIAL/PLATELET    EKG None  Radiology  DG Foot Complete Right  Result Date: 03/25/2020 Please see detailed radiograph report in office note.   Procedures Procedures (including critical care time)  Medications Ordered in  ED Medications  vancomycin (VANCOREADY) IVPB 1500 mg/300 mL (has no administration in time range)  ceFEPIme (MAXIPIME) 2 g in sodium chloride 0.9 % 100 mL IVPB (0 g Intravenous Stopped 03/25/20 1510)  metroNIDAZOLE (FLAGYL) tablet 500 mg (500 mg Oral Given 03/25/20 1442)    ED Course  I have reviewed the triage vital signs and the nursing notes.  Pertinent labs & imaging results that were available during my care of the patient were reviewed by me and considered in my medical decision making (see chart for details).    MDM Rules/Calculators/A&P                          I discussed case with Dr. March Rummage. He'd like to take to OR tonight if possible. Labs, antibiotics (discussed with pharmacy) have been ordered. Is not ill/septic appearing. Will need admission when labs return. Care to Dr. Stark Jock. Final Clinical Impression(s) / ED Diagnoses Final diagnoses:  Acute osteomyelitis of right foot [facility])    Rx / DC Orders ED Discharge Orders    None       Sherwood Gambler, MD 03/25/20 [phone]

## 2020-03-25 NOTE — ED Triage Notes (Signed)
Pt presents with c/o foot pain secondary to a right foot infection. Pt reports he is supposed to have surgery on that foot on Thursday but he went to his MD today and was told that he needed to come on to the ER.

## 2020-03-25 NOTE — Patient Instructions (Addendum)
DUE TO COVID-19 ONLY ONE VISITOR IS ALLOWED TO COME WITH YOU AND STAY IN THE WAITING ROOM ONLY DURING PRE OP AND PROCEDURE DAY OF SURGERY. THE 1 VISITOR  MAY VISIT WITH YOU AFTER SURGERY IN YOUR PRIVATE ROOM DURING VISITING HOURS ONLY!  YOU NEED TO HAVE A COVID 19 TEST ON: 03/26/20 @ 10:00 AM , THIS TEST MUST BE DONE BEFORE SURGERY,  COVID TESTING SITE [place] [address], IT IS ON THE RIGHT GOING OUT [address] APPROXIMATELY  2 MINUTES PAST [employer] ON THE RIGHT. ONCE YOUR COVID TEST IS COMPLETED,  PLEASE BEGIN THE QUARANTINE INSTRUCTIONS AS OUTLINED IN YOUR HANDOUT.                [name]    Your procedure is scheduled on: 03/27/20   Report to [facility] Main  Entrance   Report to admitting at: 6:15 AM      Call this number if you have problems the morning of surgery [phone]    Remember: Do not eat food or drink liquids :After Midnight.   BRUSH YOUR TEETH MORNING OF SURGERY AND RINSE YOUR MOUTH OUT, NO CHEWING GUM CANDY OR MINTS.    Take these medicines the morning of surgery with A SIP OF WATER: amlodipine,bupropion,carvedilol,cetirizine,cipro,clonidine,gabapentin,bactrim.  How to Manage Your Diabetes Before and After Surgery  Why is it important to control my blood sugar before and after surgery? . Improving blood sugar levels before and after surgery helps healing and can limit problems. . A way of improving blood sugar control is eating a healthy diet by: o  Eating less sugar and carbohydrates o  Increasing activity/exercise o  Talking with your doctor about reaching your blood sugar goals . High blood sugars (greater than 180 mg/dL) can raise your risk of infections and slow your recovery, so you will need to focus on controlling your diabetes during the weeks before surgery. . Make sure that the doctor who takes care of your diabetes knows about your planned surgery including the date and location.  How do I  manage my blood sugar before surgery? . Check your blood sugar at least 4 times a day, starting 2 days before surgery, to make sure that the level is not too high or low. o Check your blood sugar the morning of your surgery when you wake up and every 2 hours until you get to the Short Stay unit. . If your blood sugar is less than 70 mg/dL, you will need to treat for low blood sugar: o Do not take insulin. o Treat a low blood sugar (less than 70 mg/dL) with  cup of clear juice (cranberry or apple), 4 glucose tablets, OR glucose gel. o Recheck blood sugar in 15 minutes after treatment (to make sure it is greater than 70 mg/dL). If your blood sugar is not greater than 70 mg/dL on recheck, call [phone] for further instructions. . Report your blood sugar to the short stay nurse when you get to Short Stay.  . If you are admitted to the hospital after surgery: o Your blood sugar will be checked by the staff and you will probably be given insulin after surgery (instead of oral diabetes medicines) to make sure you have good blood sugar levels. o The goal for blood sugar control after surgery is 80-180 mg/dL.   WHAT DO I DO ABOUT MY DIABETES MEDICATION?  Marland Kitchen Do not take oral diabetes medicines (pills) the morning of surgery.  Marland Kitchen  THE DAY BEFORE SURGERY, take usual dose of Lantus and humalog insulin in the morning. At night time: Take half of the lantus dose,and DO NOT take the bedtime dose of Humalog.       . THE MORNING OF SURGERY, take half of the lantus insulin dose.  . If your CBG is greater than 220 mg/dL, you may take  of your sliding scale  . (correction) dose of Humalog insulin.                        You may not have any metal on your body including hair pins and              piercings  Do not wear jewelry,lotions, powders or perfumes, deodorant             Men may shave face and neck.   Do not bring valuables to the hospital. [place].  Contacts, dentures or bridgework may not be worn into surgery.  Leave suitcase in the car. After surgery it may be brought to your room.     Patients discharged the day of surgery will not be allowed to drive home. IF YOU ARE HAVING SURGERY AND GOING HOME THE SAME DAY, YOU MUST HAVE AN ADULT TO DRIVE YOU HOME AND BE WITH YOU FOR 24 HOURS. YOU MAY GO HOME BY TAXI OR UBER OR ORTHERWISE, BUT AN ADULT MUST ACCOMPANY YOU HOME AND STAY WITH YOU FOR 24 HOURS.  Name and phone number of your driver:  Special Instructions: N/A              Please read over the following fact sheets you were given: _____________________________________________________________________         [facility] - Preparing for Surgery Before surgery, you can play an important role.  Because skin is not sterile, your skin needs to be as free of germs as possible.  You can reduce the number of germs on your skin by washing with CHG (chlorahexidine gluconate) soap before surgery.  CHG is an antiseptic cleaner which kills germs and bonds with the skin to continue killing germs even after washing. Please DO NOT use if you have an allergy to CHG or antibacterial soaps.  If your skin becomes reddened/irritated stop using the CHG and inform your nurse when you arrive at Short Stay. Do not shave (including legs and underarms) for at least 48 hours prior to the first CHG shower.  You may shave your face/neck. Please follow these instructions carefully:  1.  Shower with CHG Soap the night before surgery and the  morning of Surgery.  2.  If you choose to wash your hair, wash your hair first as usual with your  normal  shampoo.  3.  After you shampoo, rinse your hair and body thoroughly to remove the  shampoo.                           4.  Use CHG as you would any other liquid soap.  You can apply chg directly  to the skin and wash                       Gently with a scrungie or clean washcloth.  5.  Apply the CHG Soap to your body  ONLY FROM THE NECK DOWN.   Do not use on face/ open                           Wound or open sores. Avoid contact with eyes, ears mouth and genitals (private parts).                       Wash face,  Genitals (private parts) with your normal soap.             6.  Wash thoroughly, paying special attention to the area where your surgery  will be performed.  7.  Thoroughly rinse your body with warm water from the neck down.  8.  DO NOT shower/wash with your normal soap after using and rinsing off  the CHG Soap.                9.  Pat yourself dry with a clean towel.            10.  Wear clean pajamas.            11.  Place clean sheets on your bed the night of your first shower and do not  sleep with pets. Day of Surgery : Do not apply any lotions/deodorants the morning of surgery.  Please wear clean clothes to the hospital/surgery center.  FAILURE TO FOLLOW THESE INSTRUCTIONS MAY RESULT IN THE CANCELLATION OF YOUR SURGERY PATIENT SIGNATURE_________________________________  NURSE SIGNATURE__________________________________  ________________________________________________________________________

## 2020-03-25 NOTE — H&P (Signed)
[facility] History and Physical   Patient: [name] AYT:[id number]   PCP: Gildardo Pounds, NP DOB: [birth year]   DOA: 03/25/2020   DOS: 03/25/2020   DOS: the patient was seen and examined on 03/25/2020  Patient coming from: The patient is coming from Home  Chief Complaint: Wound infection  HPI: KEYSHAWN HELLWIG is a 49 y.o. male with Past medical history of type 2 diabetes mellitus, prior history of amputation, HTN, CKD 3a. Patient presented with complaints of worsening wound ongoing for last 1 week. Denies any fever or chills.  Had foul-smelling discharge coming out from the wound. No diarrhea no constipation. No injury reported as well. Patient was seen by podiatry in the clinic and was brought to the ER for further work-up. At the time of my evaluation denies any pain in his leg.  No headache no chest pain.  No shortness of breath. Tolerated prior amputations well without any complication. Able to ambulate around the house without any shortness of breath or chest pain. No dizziness no lightheadedness. Reports occasional fatigue. Smokes 5 to 6 cigarettes a day. No alcohol abuse no drug abuse.  ED Course: X-ray negative for osteomyelitis.  Patient was referred for admission for podiatry evaluation.  Review of Systems: as mentioned in the history of present illness.  All other systems reviewed and are negative.  Past Medical History:  Diagnosis Date  . Allergy    seasonal allergies  . Anemia    on meds  . Diabetes mellitus type II, uncontrolled ([place])    on meds  . Glaucoma    right   . Hyperlipidemia    on meds  . Hypertension    on meds  . Other and unspecified angina pectoris   . Tobacco use    Past Surgical History:  Procedure Laterality Date  . AMPUTATION Left 08/22/2016   Procedure: GREAT TOE AMPUTATION;  Surgeon: [name], MD;  Location: [place];  Service: Orthopedics;  Laterality: Left;  . AMPUTATION Right 05/28/2017   Procedure: AMPUTATION  1st & 3rd TOE;  Surgeon: [name], MD;  Location: [place];  Service: Orthopedics;  Laterality: Right;  . EYE SURGERY     Both Eye Lasik   . IRRIGATION AND DEBRIDEMENT FOOT Left 06/29/2019   Procedure: IRRIGATION AND DEBRIDEMENT FOOT application wound vac;  Surgeon: [name], DPM;  Location: [facility];  Service: Podiatry;  Laterality: Left;  . LEFT HEART CATHETERIZATION WITH CORONARY ANGIOGRAM N/A 07/31/2013   Procedure: LEFT HEART CATHETERIZATION WITH CORONARY ANGIOGRAM;  Surgeon: Jettie Booze, MD;  Location: [facility] C[facility];  Service: Cardiovascular;  Laterality: N/A;  . spinal tap    . toe amputated     Social History:  reports that he has been smoking cigarettes. He has been smoking about 0.00 packs per day for the past 20.00 years. He has never used smokeless tobacco. He reports previous alcohol use. He reports that he does not use drugs.  Allergies  Allergen Reactions  . Bee Venom Swelling    SWELLING REACTION UNSPECIFIED   . Penicillins Hives and Other (See Comments)    PATIENT HAS HAD A PCN REACTION WITH IMMEDIATE RASH, FACIAL/TONGUE/THROAT SWELLING, SOB, OR LIGHTHEADEDNESS WITH HYPOTENSION:  **Can tolerate cephalosporins**  . Clonidine Derivatives     Pt states "It is making me dizzy"   Family history reviewed and not pertinent Family History  Problem Relation Age of Onset  . Cancer Mother   . Lung cancer Mother  55  . Diabetes Father   . Hypertension Father   . Colon cancer Neg Hx   . Colon polyps Neg Hx   . Esophageal cancer Neg Hx   . Rectal cancer Neg Hx   . Stomach cancer Neg Hx      Prior to Admission medications   Medication Sig Start Date End Date Taking? Authorizing Provider  acetaminophen (TYLENOL) 500 MG tablet Take 1,000 mg by mouth every 6 (six) hours as needed for mild pain.   Yes [provider]  amLODipine (NORVASC) 10 MG tablet Take 1 tablet (10 mg total) by mouth daily. 02/26/20  Yes Claiborne Rigg, NP  aspirin EC 81 MG tablet Take  1 tablet (81 mg total) by mouth daily. 02/26/20  Yes Claiborne Rigg, NP  atorvastatin (LIPITOR) 20 MG tablet Take 1 tablet (20 mg total) by mouth daily. 02/26/20 05/26/20 Yes Claiborne Rigg, NP  b complex vitamins tablet Take 1 tablet by mouth daily. 10/18/18  Yes Plotnikov, Georgina Quint, MD  buPROPion (WELLBUTRIN SR) 150 MG 12 hr tablet Take 1 tablet (150 mg total) by mouth daily. 02/26/20 05/26/20 Yes Claiborne Rigg, NP  carvedilol (COREG) 25 MG tablet Take 1 tablet (25 mg total) by mouth 2 (two) times daily. 02/26/20 08/24/20 Yes Claiborne Rigg, NP  Cholecalciferol (VITAMIN D3) 50 MCG (2000 UT) capsule Take 1 capsule (2,000 Units total) by mouth daily. 10/23/19  Yes Hoy Register, MD  ciprofloxacin (CIPRO) 500 MG tablet Take 1 tablet (500 mg total) by mouth 2 (two) times daily. 03/19/20  Yes Felecia Shelling, DPM  cloNIDine (CATAPRES) 0.1 MG tablet Take 1 tablet (0.1 mg total) by mouth 2 (two) times daily. 03/06/20  Yes Corky Crafts, MD  escitalopram (LEXAPRO) 10 MG tablet Take 1 tablet (10 mg total) by mouth at bedtime. 02/26/20  Yes Claiborne Rigg, NP  ferrous sulfate 325 (65 FE) MG tablet Take 1 tablet (325 mg total) by mouth daily. 02/26/20 08/24/20 Yes Claiborne Rigg, NP  gabapentin (NEURONTIN) 300 MG capsule Take 1 capsule (300 mg total) by mouth 3 (three) times daily. Patient needs office visit before refills will be given 02/26/20  Yes Claiborne Rigg, NP  insulin glargine (LANTUS SOLOSTAR) 100 UNIT/ML Solostar Pen Inject 15 Units into the skin 2 (two) times daily. 02/26/20 05/26/20 Yes Claiborne Rigg, NP  lisinopril (ZESTRIL) 10 MG tablet Take 1 tablet (10 mg total) by mouth daily. 02/26/20 05/26/20 Yes Claiborne Rigg, NP  sulfamethoxazole-trimethoprim (BACTRIM DS) 800-160 MG tablet Take 1 tablet by mouth 2 (two) times daily. 03/19/20  Yes Felecia Shelling, DPM  Accu-Chek FastClix Lancets MISC 1 Package by Does not apply route as directed. Use as instructed to test blood sugar 2  times daily E11.65 02/26/20   Claiborne Rigg, NP  blood glucose meter kit and supplies KIT Dispense based on patient and insurance preference. Use up to four times daily as directed. (FOR ICD-9 250.00, 250.01). 08/25/16   Alm Bustard, MD  Blood Pressure Monitor DEVI Please provide patient with insurance approved blood pressure monitor. I10.0 10/05/19   Claiborne Rigg, NP  glucose blood test strip Use as instructed to test blood sugar 2 times daily E11.65 02/26/20   Claiborne Rigg, NP  insulin lispro (HUMALOG KWIKPEN) 100 UNIT/ML KwikPen For blood sugars 0-150 give 0 units of insulin, 151-200 give 2 units of insulin, 201-250 give 4 units, 251-300 give 6 units, 301-350 give 8 units, 351-400 give  10 units,> 400 give 12 units and call M.D. Discussed hypoglycemia protocol. 02/26/20   Gildardo Pounds, NP  Insulin Pen Needle 32G X 4 MM MISC Use as instructed. Inject into the skin three time daily. 02/26/20   Gildardo Pounds, NP  Misc. Devices MISC Please provide patient with insurance approved diabetic shoes. E11.65 01/14/20   Gildardo Pounds, NP  cetirizine (ZYRTEC) 10 MG tablet Take 1 tablet (10 mg total) by mouth daily. Prn itching Patient not taking: Reported on 03/25/2020 11/01/19 03/25/20  Argentina Donovan, PA-C    Physical Exam: Vitals:   03/25/20 1700 03/25/20 1800 03/25/20 1830 03/25/20 1916  BP: (!) 205/108 (!) 189/108 (!) 159/87   Pulse: 81 85 92   Resp: $Remo'16 18 16   'MsgFu$ Temp:      SpO2: 100% 100% 100%   Weight:    90.7 kg  Height:    '5\' 4"'$  (1.626 m)    General: alert and oriented to time, place, and person. Appear in mild distress, affect appropriate Eyes: PERRL, Conjunctiva normal ENT: Oral Mucosa Clear, moist  Neck: no JVD, no Abnormal Mass Or lumps Cardiovascular: S1 and S2 Present, no Murmur, peripheral pulses symmetrical Respiratory: good respiratory effort, Bilateral Air entry equal and Decreased, no of accessory muscle use, Clear to Auscultation, no Crackles, no  wheezes Abdomen: Bowel Sound present, Soft and no tenderness, no hernia Skin: Redness of right leg Extremities: Trace pedal edema, no calf tenderness Neurologic: without any new focal findings Gait not checked due to patient safety concerns  Data Reviewed: I have personally reviewed and interpreted labs, imaging as discussed below.  CBC: Recent Labs  Lab 03/25/20 1500  WBC 17.8*  NEUTROABS 13.1*  HGB 9.4*  HCT 30.1*  MCV 87.0  PLT 629*   Basic Metabolic Panel: Recent Labs  Lab 03/25/20 1500  NA 138  K 3.9  CL 110  CO2 19*  GLUCOSE 193*  BUN 29*  CREATININE 2.58*  CALCIUM 8.6*   GFR: Estimated Creatinine Clearance: 34.8 mL/min (A) (by C-G formula based on SCr of 2.58 mg/dL (H)). Liver Function Tests: Recent Labs  Lab 03/25/20 1500  AST 14*  ALT 28  ALKPHOS 83  BILITOT 0.3  PROT 7.9  ALBUMIN 2.9*   No results for input(s): LIPASE, AMYLASE in the last 168 hours. No results for input(s): AMMONIA in the last 168 hours. Coagulation Profile: No results for input(s): INR, PROTIME in the last 168 hours. Cardiac Enzymes: No results for input(s): CKTOTAL, CKMB, CKMBINDEX, TROPONINI in the last 168 hours. BNP (last 3 results) No results for input(s): PROBNP in the last 8760 hours. HbA1C: No results for input(s): HGBA1C in the last 72 hours. CBG: Recent Labs  Lab 03/25/20 1922  GLUCAP 100*   Lipid Profile: No results for input(s): CHOL, HDL, LDLCALC, TRIG, CHOLHDL, LDLDIRECT in the last 72 hours. Thyroid Function Tests: No results for input(s): TSH, T4TOTAL, FREET4, T3FREE, THYROIDAB in the last 72 hours. Anemia Panel: No results for input(s): VITAMINB12, FOLATE, FERRITIN, TIBC, IRON, RETICCTPCT in the last 72 hours. Urine analysis:    Component Value Date/Time   COLORURINE YELLOW 10/17/2018 1441   APPEARANCEUR CLEAR 10/17/2018 1441   LABSPEC >=1.030 (A) 10/17/2018 1441   PHURINE 5.0 10/17/2018 1441   GLUCOSEU >=1000 (A) 10/17/2018 1441   HGBUR MODERATE  (A) 10/17/2018 1441   BILIRUBINUR NEGATIVE 10/17/2018 1441   KETONESUR NEGATIVE 10/17/2018 1441   PROTEINUR >=300 (A) 05/25/2017 1300   UROBILINOGEN 0.2 10/17/2018 1441  NITRITE NEGATIVE 10/17/2018 1441   LEUKOCYTESUR NEGATIVE 10/17/2018 1441    Radiological Exams on Admission: DG Foot Complete Right  Result Date: 03/25/2020 Please see detailed radiograph report in office note.  Echocardiogram: 60 to 65% EF.  Recent stress test Mar 18, 2020 normal.  I reviewed all nursing notes, pharmacy notes, vitals, pertinent old records.  Assessment/Plan 1.  Right leg cellulitis with osteomyelitis. Evidence of gas concerning for necrosis and gangrene Podiatry consulted. Patient will be taken to the OR today. Remains NPO. Low risk for cardiovascular outcome based on the recent negative stress test. Pain control per podiatry. DVT prophylaxis per podiatry.  2.  Type 2 diabetes mellitus uncontrolled with hyperglycemia with renal complication and nonhealing wound as well as HLD Currently n.p.o. Holding oral hypoglycemic agent as well as insulin. Sliding scale insulin for now for every 4 hours.  3.  Chronic kidney disease stage IIIa. Renal function baseline. Monitor  4.  CAD. Negative stress test. Potential PVD. Doppler 2019 noncompressible arteries but normal ABI. We will check ABI again. Patient can proceed with the procedure.  5.  HTN. Blood pressure elevated. As needed IV hydralazine. Continue with home regimen of Norvasc, Coreg, clonidine Hold lisinopril.  6.  Neuropathy. Continue gabapentin. Continue pain control with as well per  7.  Active smoker. Counseled the patient to quit smoking. Patient currently willing. Patient is on Wellbutrin dose can be adjusted for smoking cessation. Nicotine patch for now.  Nutrition: NPO  DVT Prophylaxis: Subcutaneous Heparin   Advance goals of care discussion: Full code   Consults: Podiatry  Family Communication: no family was  present at bedside, at the time of interview.   Disposition:  From: Home Likely will need SNF on discharge.   Author: Berle Mull, MD [facility] 03/25/2020 7:38 PM   To reach On-call, see care teams to locate the attending and reach out to them via [email or web address]. If 7PM-7AM, please contact night-coverage If you still have difficulty reaching the attending provider, please page the [facility] (Director on Call) for [facility] on amion for assistance.

## 2020-03-25 NOTE — Plan of Care (Signed)
Plan for OR today for emergency Incision and Drainage. PT states he has not had anything to eat or drink today. Will follow post-operatively.  [name], DPM

## 2020-03-25 NOTE — H&P (Signed)
Anesthesia H&P Update: History and Physical Exam reviewed; patient is OK for planned anesthetic and procedure. ? ?

## 2020-03-25 NOTE — Progress Notes (Signed)
  Subjective:  Patient ID: [name], male    DOB: 12-06-1970,  MRN: [id number]  Chief Complaint  Patient presents with  . Wound Check    "I think it is worse" Denies fever/chills/nausea/vomiting.    49 y.o. male presents for wound care. Hx confirmed with patient.  Thinks that the wound is doing worse since he was seen in the office last week.  Denies nausea vomiting fevers and chills.  Denies other acute concerns.  Unsure how the ulcer started as this was a new ulcer that we have not treated.  States that there was a new spot that has since developed since last visit. Objective:  Physical Exam: Wound Location: Right fifth metatarsal head and proximal metatarsal Wound Base: Necrotic and boggy Peri-wound: Reddened Exudate: Large amount of dishwater pus appears to have worsened since last recheck, + induration and + tenderness  Vitals:   03/25/20 1027  Temp: 98.7 F (37.1 C)  BP 105  No images are attached to the encounter.  Radiographs:  X-ray of the right foot: Osteomyelitis noted to the fifth metatarsal with multiple foci of subcutaneous emphysema concerning for gas gangrene Assessment:   1. Other acute osteomyelitis of right foot ([place])   2. 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 Ulcer of right foot with necrosis of bone ([place])   4. Diabetic foot infection ([place])   5. Bone erosion    Plan:  Patient was evaluated and treated and all questions answered.  Ulcer right foot with osteomyelitis and gas gangrene -Purulence was cultured today -Wound has significantly worsened since he was seen in the office last week.  He has failed outpatient therapy with p.o. antibiotics -Discussed with patient that given findings in appearance he is to proceed directly to the emergency room for treatment.  We discussed that should he not get proper treatment he stands a risk of losing his foot.  He will need admission and surgery for incision and drainage.  I am concerned from a soft tissue envelope  and the proximal aspect of the new lesion to the fifth met tarsal area that transmetatarsal amputation may not be possible.  Will assess options after initial incision and drainage surgery.  We will follow patient while hospitalized.  No follow-ups on file.

## 2020-03-25 NOTE — Progress Notes (Signed)
Pharmacy Antibiotic Note  [name] is a 49 y.o. male admitted on 03/25/2020 with diabetic foot infection.  Pharmacy has been consulted for Vancomycin dosing. SCr 2.58  Plan: Vancomycin 1500 mg IV x1 then 1250 IV q48h.  (est AUC 488 using SCr 2.58) Ceftriaxone and metronidazole per MD. Measure Vanc peak and trough at steady state.  Goal AUC = 400 - 550.   Follow up renal function, culture results, and clinical course.   Height: 5\' 4"  (162.6 cm) Weight: 90.7 kg (200 lb) IBW/kg (Calculated) : 59.2  Temp (24hrs), Avg:98.5 F (36.9 C), Min:98.2 F (36.8 C), Max:98.7 F (37.1 C)  Recent Labs  Lab 03/25/20 1500  WBC 17.8*  CREATININE 2.58*  LATICACIDVEN 1.3    Estimated Creatinine Clearance: 34.8 mL/min (A) (by C-G formula based on SCr of 2.58 mg/dL (H)).    Allergies  Allergen Reactions  . Bee Venom Swelling    SWELLING REACTION UNSPECIFIED   . Penicillins Hives and Other (See Comments)    PATIENT HAS HAD A PCN REACTION WITH IMMEDIATE RASH, FACIAL/TONGUE/THROAT SWELLING, SOB, OR LIGHTHEADEDNESS WITH HYPOTENSION:  **Can tolerate cephalosporins**  . Clonidine Derivatives     Pt states "It is making me dizzy"    Antimicrobials this admission:  PTA Cipro / Bactrim 12/22 >>12/28 12/28 Vanc >> 12/28 Cefepime x1 12/28 metronidazole >>  12/28 Ceftriaxone >>   Dose adjustments this admission:   Microbiology results:  12/22 Wound (no site specified):  Proteus mirabilis (pan-sens except amp, cefazolin) 12/28 BCx:   Thank you for allowing pharmacy to be a part of this patient's care.  Gretta Arab PharmD, BCPS Clinical Pharmacist [facility] [phone] 03/25/2020 6:44 PM

## 2020-03-25 NOTE — Progress Notes (Signed)
Pt. Needs orders for upcomming surgery.PAT and labs on 03/26/20.Thanks.

## 2020-03-25 NOTE — Anesthesia Preprocedure Evaluation (Addendum)
Anesthesia Evaluation  Patient identified by MRN, date of birth, ID band Patient awake    Reviewed: Allergy & Precautions, NPO status , Patient's Chart, lab work & pertinent test results, reviewed documented beta blocker date and time   History of Anesthesia Complications Negative for: history of anesthetic complications  Airway Mallampati: I  TM Distance: >3 FB Neck ROM: Full    Dental  (+) Poor Dentition, Missing, Dental Advisory Given, Chipped   Pulmonary Current SmokerPatient did not abstain from smoking.,  03/25/2020 SARS coronavirus NEG   breath sounds clear to auscultation       Cardiovascular hypertension, Pt. on medications and Pt. on home beta blockers (-) angina+ Peripheral Vascular Disease   Rhythm:Regular Rate:Normal  12/2019 ECHO: EF 60-65%, mod LVH, Grade 1 DD, no significant valvular abnormalities  03/18/2020 Stress: EF: 55%. no ST segment deviation noted during stress.The study is normal, EF normal (55-65%). low risk study.     Neuro/Psych Depression Glaucoma Diabetic peripheral neuropathy    GI/Hepatic negative GI ROS, Neg liver ROS,   Endo/Other  diabetes (glu 100), Insulin Dependent  Renal/GU Renal Insufficiency and ARFRenal disease (creat 2.58)     Musculoskeletal   Abdominal   Peds  Hematology  (+) Blood dyscrasia (Hb 9.4), anemia ,   Anesthesia Other Findings   Reproductive/Obstetrics                            Anesthesia Physical Anesthesia Plan  ASA: III  Anesthesia Plan: MAC   Post-op Pain Management:    Induction:   PONV Risk Score and Plan: 0 and Ondansetron  Airway Management Planned: Natural Airway and Simple Face Mask  Additional Equipment: None  Intra-op Plan:   Post-operative Plan:   Informed Consent: I have reviewed the patients History and Physical, chart, labs and discussed the procedure including the risks, benefits and alternatives  for the proposed anesthesia with the patient or authorized representative who has indicated his/her understanding and acceptance.     Dental advisory given  Plan Discussed with: CRNA and Surgeon  Anesthesia Plan Comments:        Anesthesia Quick Evaluation

## 2020-03-25 NOTE — Op Note (Signed)
Patient Name: [name] DOB: [birth year]  MRN: [id number]   Date of Surgery: 03/25/2020  Surgeon: Dr. Hardie Pulley, DPM Assistants: none  Pre-operative Diagnosis:  Gas gangrene, abscess, diabetic foot infection right foot Post-operative Diagnosis:  Same Procedures:  1) Incision and drainage complex foot abscess right foot  2) Partial 5th Ray Resection Pathology/Specimens: ID Type Source Tests Collected by Time Destination  A : right fifth metatarsal Tissue Wound GRAM STAIN, AEROBIC/ANAEROBIC CULTURE (SURGICAL/DEEP WOUND) [name], DPM 03/25/2020 2012    Anesthesia: MAC local Hemostasis: * Missing tourniquet times found for documented tourniquets in log: 250539 * Estimated Blood Loss: 25 ml Materials: * No implants in log * Medications: 20 mls 0.5% Marcaine plain, 1 g vancomycin powder topical Complications: None  Indications for Procedure:  This is a 49 y.o. male with a severe right foot infection.  He was scheduled for transmetatarsal mutation after being seen in the office last week however the wound significantly worsened.  When he presented to the office today he was noted that he had a new large wound that communicated to the first wound with dishwater pus and severe malodor.  X-rays were taken and showed subcutaneous emphysema.  It was discussed he would benefit from incision and drainage with removal of all nonviable soft tissue and bone.  This it was discussed he likely will still need transmetatarsal amputation later if possible.  All risk benefits alternatives of surgery were discussed the patient.  Side effects of not pursuing surgery were discussed as well.  Patient agreed to proceed   Procedure in Detail: Patient was identified in pre-operative holding area. Formal consent was signed and the right lower extremity was marked. Patient was brought back to the operating room. Anesthesia was induced. The extremity was prepped and draped in the usual sterile  fashion. Timeout was taken to confirm patient name, laterality, and procedure prior to incision.   Attention was then directed to the right foot.  There were 2 large wounds with purulence expressible from both areas.  The distal wound went directly down to the fifth metatarsal bone and the fifth proximal phalanx.  The wound was sharply incised from the proximal aspect of the fifth metatarsal to the distal aspect of the fifth toe.  Dissection was continued down to level of the bone.  This dissection was carried through large amounts of nonviable necrotic tissue.  The fifth metatarsophalangeal joint was examined and both the proximal phalanx and metatarsal head were noted to be soft and readily punctured with a 15 blade.  They had a slight grayish appearance. The wound was initially irrigated with approximately 1 L normal saline via pulse lavage.  For this reason both the fifth toe and the fifth metatarsal were deemed nonviable.  An incision was then made around the circumferential aspect of the fifth toe and the fifth toe was disarticulated at the metatarsophalangeal joint.  The fifth metatarsal was then transected with a bone cutter until more firm viable bone was noted.  The majority of the fifth metatarsal was noted to be soft and nonviable.  A portion of the fifth metatarsal head was collected for bone culture.  The wound was then further excisionally debrided with a rongeur to viable bleeding tissue.  The wound was then further irrigated with 2 L normal saline via pulse lavage.  1 g of vancomycin powder was applied topically to the wound.  The foot was then dressed with Betadine soaked 4 x 4's, Kerlix, Ace bandage. Patient tolerated  the procedure well.  Disposition: Following a period of post-operative monitoring, patient will be transferred to the floor.  He will be admitted to the hospital service.  He will most likely will need a modified transmetatarsal amputation.  We will tentatively plan for this  procedure Thursday after allowing the tissues to settle.  He will continue IV antibiotics while in the hospital.  He will likely remain hospitalized for several days as cultures return and through his second surgery.  Podiatry will continue to follow.

## 2020-03-25 NOTE — Anesthesia Procedure Notes (Signed)
Performed by: Theadore Blunck D, CRNA Oxygen Delivery Method: Simple face mask       

## 2020-03-25 NOTE — Progress Notes (Signed)
A consult was received from an ED physician for Vancomycin per pharmacy dosing.  The patient's profile has been reviewed for ht/wt/allergies/indication/available labs.   A one time order has been placed for Vancomycin 1500mg .  Further antibiotics/pharmacy consults should be ordered by admitting physician if indicated.                       Thank you,  Gretta Arab PharmD, BCPS Clinical Pharmacist [facility] [phone] 03/25/2020 2:22 PM

## 2020-03-25 NOTE — Telephone Encounter (Signed)
   Primary Cardiologist: Larae Grooms, MD  Chart reviewed as part of pre-operative protocol coverage. Given past medical history and time since last visit, based on ACC/AHA guidelines, [name] would be at acceptable risk for the planned procedure without further cardiovascular testing. He was last seen 03/06/20 by Dr. Irish Lack and had normal exercise cardiolite 03/18/20.   Seen by podiatry today with office note remarkable for "Discussed with patient that given findings in appearance he is to proceed directly to the emergency room for treatment.  We discussed that should he not get proper treatment he stands a risk of losing his foot.  He will need admission and surgery for incision and drainage.  I am concerned from a soft tissue envelope and the proximal aspect of the new lesion to the fifth met tarsal area that transmetatarsal amputation may not be possible."  Preop request was not clear how long Aspirin was requested to be held prior to procedure. As he will likely be admitted today based on podiatry and ED notation, will route clearance to requesting office and allow in-hospital team to determine length of aspirin hold if indicated.  I will route this recommendation to the requesting party via Epic fax function and remove from pre-op pool.  Please call with questions.  Loel Dubonnet, NP 03/25/2020, 2:33 PM

## 2020-03-25 NOTE — Anesthesia Postprocedure Evaluation (Signed)
Anesthesia Post Note  Patient: [name]  Procedure(s) Performed: IRRIGATION AND DEBRIDEMENT OF RIGHT FOOT. AMPUTATION OF FIFTH TOE AND PARTIAL OF FOURTH. (Right )     Patient location during evaluation: PACU Anesthesia Type: MAC Level of consciousness: awake and alert, patient cooperative and oriented Pain management: pain level controlled (pt says he does not have pain) Vital Signs Assessment: post-procedure vital signs reviewed and stable Respiratory status: spontaneous breathing, nonlabored ventilation and respiratory function stable Cardiovascular status: stable and blood pressure returned to baseline Postop Assessment: no apparent nausea or vomiting Anesthetic complications: no   No complications documented.  Last Vitals:  Vitals:   03/25/20 1830 03/25/20 2034  BP: (!) 159/87   Pulse: 92 84  Resp: 16   Temp:  37.1 C  SpO2: 100%                   Zayan Delvecchio,E. Kawehi Hostetter

## 2020-03-26 ENCOUNTER — Inpatient Hospital Stay (HOSPITAL_COMMUNITY): Payer: Medicaid Other

## 2020-03-26 ENCOUNTER — Encounter (HOSPITAL_COMMUNITY): Payer: Self-pay | Admitting: Podiatry

## 2020-03-26 ENCOUNTER — Other Ambulatory Visit (HOSPITAL_COMMUNITY): Payer: Medicaid Other

## 2020-03-26 ENCOUNTER — Encounter (HOSPITAL_COMMUNITY)
Admission: RE | Admit: 2020-03-26 | Discharge: 2020-03-26 | Disposition: A | Discharge status: Home / Self Care | Payer: Medicaid Other | Source: Ambulatory Visit | Attending: Nurse Practitioner | Admitting: Nurse Practitioner

## 2020-03-26 DIAGNOSIS — A480 Gas gangrene: Secondary | ICD-10-CM | POA: Diagnosis not present

## 2020-03-26 DIAGNOSIS — M86171 Other acute osteomyelitis, right ankle and foot: Secondary | ICD-10-CM | POA: Diagnosis not present

## 2020-03-26 DIAGNOSIS — E0843 Diabetes mellitus due to underlying condition with diabetic autonomic (poly)neuropathy: Secondary | ICD-10-CM | POA: Diagnosis not present

## 2020-03-26 DIAGNOSIS — L089 Local infection of the skin and subcutaneous tissue, unspecified: Secondary | ICD-10-CM

## 2020-03-26 DIAGNOSIS — T148XXA Other injury of unspecified body region, initial encounter: Secondary | ICD-10-CM

## 2020-03-26 DIAGNOSIS — Z9889 Other specified postprocedural states: Secondary | ICD-10-CM | POA: Diagnosis not present

## 2020-03-26 LAB — COMPREHENSIVE METABOLIC PANEL
ALT: 21 U/L (ref 0–44)
AST: 13 U/L — ABNORMAL LOW (ref 15–41)
Albumin: 2.6 g/dL — ABNORMAL LOW (ref 3.5–5.0)
Alkaline Phosphatase: 68 U/L (ref 38–126)
Anion gap: 8 (ref 5–15)
BUN: 26 mg/dL — ABNORMAL HIGH (ref 6–20)
CO2: 17 mmol/L — ABNORMAL LOW (ref 22–32)
Calcium: 8.4 mg/dL — ABNORMAL LOW (ref 8.9–10.3)
Chloride: 112 mmol/L — ABNORMAL HIGH (ref 98–111)
Creatinine, Ser: 2.67 mg/dL — ABNORMAL HIGH (ref 0.61–1.24)
GFR, Estimated: 28 mL/min — ABNORMAL LOW (ref >60)
Glucose, Bld: 160 mg/dL — ABNORMAL HIGH (ref 70–99)
Potassium: 4.4 mmol/L (ref 3.5–5.1)
Sodium: 137 mmol/L (ref 135–145)
Total Bilirubin: 0.4 mg/dL (ref 0.3–1.2)
Total Protein: 6.8 g/dL (ref 6.5–8.1)

## 2020-03-26 LAB — CBC
HCT: 26.5 % — ABNORMAL LOW (ref 39.0–52.0)
Hemoglobin: 8.2 g/dL — ABNORMAL LOW (ref 13.0–17.0)
MCH: 27.4 pg (ref 26.0–34.0)
MCHC: 30.9 g/dL (ref 30.0–36.0)
MCV: 88.6 fL (ref 80.0–100.0)
Platelets: 382 10*3/uL (ref 150–400)
RBC: 2.99 MIL/uL — ABNORMAL LOW (ref 4.22–5.81)
RDW: 14.8 % (ref 11.5–15.5)
WBC: 16.6 10*3/uL — ABNORMAL HIGH (ref 4.0–10.5)
nRBC: 0 % (ref 0.0–0.2)

## 2020-03-26 LAB — GLUCOSE, CAPILLARY
Glucose-Capillary: 108 mg/dL — ABNORMAL HIGH (ref 70–99)
Glucose-Capillary: 108 mg/dL — ABNORMAL HIGH (ref 70–99)
Glucose-Capillary: 118 mg/dL — ABNORMAL HIGH (ref 70–99)
Glucose-Capillary: 121 mg/dL — ABNORMAL HIGH (ref 70–99)
Glucose-Capillary: 139 mg/dL — ABNORMAL HIGH (ref 70–99)
Glucose-Capillary: 143 mg/dL — ABNORMAL HIGH (ref 70–99)
Glucose-Capillary: 163 mg/dL — ABNORMAL HIGH (ref 70–99)

## 2020-03-26 LAB — SEDIMENTATION RATE: Sed Rate: 80 mm/hr — ABNORMAL HIGH (ref 0–16)

## 2020-03-26 LAB — HEMOGLOBIN A1C
Hgb A1c MFr Bld: 7.9 % — ABNORMAL HIGH (ref 4.8–5.6)
Mean Plasma Glucose: 180.03 mg/dL

## 2020-03-26 LAB — HIV ANTIBODY (ROUTINE TESTING W REFLEX): HIV Screen 4th Generation wRfx: NONREACTIVE

## 2020-03-26 MED ORDER — CLINDAMYCIN PHOSPHATE 900 MG/50ML IV SOLN
900.0000 mg | INTRAVENOUS | Status: AC
  Administered 2020-03-27: 900 mg via INTRAVENOUS
  Filled 2020-03-26 (×3): qty 50

## 2020-03-26 MED ORDER — CHLORHEXIDINE GLUCONATE CLOTH 2 % EX PADS
6.0000 | MEDICATED_PAD | CUTANEOUS | Status: DC
  Administered 2020-03-26: 6 via TOPICAL

## 2020-03-26 MED ORDER — CHLORHEXIDINE GLUCONATE 0.12 % MT SOLN
15.0000 mL | Freq: Once | OROMUCOSAL | Status: AC
  Administered 2020-03-27: 15 mL via OROMUCOSAL
  Filled 2020-03-26: qty 15

## 2020-03-26 MED ORDER — ORAL CARE MOUTH RINSE
15.0000 mL | Freq: Once | OROMUCOSAL | Status: AC
  Administered 2020-03-26: 15 mL via OROMUCOSAL

## 2020-03-26 MED ORDER — LACTATED RINGERS IV SOLN: INTRAVENOUS | Status: DC

## 2020-03-26 MED FILL — Vancomycin HCl For IV Soln 1 GM (Base Equivalent): INTRAVENOUS | Qty: 1000 | Status: AC

## 2020-03-26 NOTE — Progress Notes (Signed)
Pt alert and aware in bed. He states he is experiencing some pain. He requested prayer. The chaplain offered caring and supportive presence, prayers and blessings. Further visits will be offered during pt's stay.

## 2020-03-26 NOTE — Progress Notes (Signed)
Subjective:  Patient ID: [name], male    DOB: 1970-08-26,  MRN: [id number]  Patient seen bedside. Denies complaints today. No issues since surgery.  Objective:   Vitals:   03/26/20 0817 03/26/20 1145  BP: 135/81 138/81  Pulse: 78 76  Resp: 19 16  Temp: 98.6 F (37 C) 98.1 F (36.7 C)  SpO2: 100% 98%   General AA&O x3. Normal mood and affect.  Vascular Foot warm and well perfused.  Neurologic Epicritic sensation grossly diminished.  Dermatologic Dressing C/D/I  Orthopedic: + motor to remaining toes and ankle   Results for orders placed or performed during the hospital encounter of 03/25/20  Resp Panel by RT-PCR (Flu A&B, Covid) Nasopharyngeal Swab     Status: None   Collection Time: 03/25/20  2:24 PM   Specimen: Nasopharyngeal Swab; Nasopharyngeal(NP) swabs in vial transport medium  Result Value Ref Range Status   SARS Coronavirus 2 by RT PCR NEGATIVE NEGATIVE Final    Comment: (NOTE) SARS-CoV-2 target nucleic acids are NOT DETECTED.  The SARS-CoV-2 RNA is generally detectable in upper respiratory specimens during the acute phase of infection. The lowest concentration of SARS-CoV-2 viral copies this assay can detect is 138 copies/mL. A negative result does not preclude SARS-Cov-2 infection and should not be used as the sole basis for treatment or other patient management decisions. A negative result may occur with  improper specimen collection/handling, submission of specimen other than nasopharyngeal swab, presence of viral mutation(s) within the areas targeted by this assay, and inadequate number of viral copies(<138 copies/mL). A negative result must be combined with clinical observations, patient history, and epidemiological information. The expected result is Negative.  Fact Sheet for Patients:  EntrepreneurPulse.com.au  Fact Sheet for Healthcare Providers:  IncredibleEmployment.be  This test is no t yet approved or  cleared by the [place] FDA and  has been authorized for detection and/or diagnosis of SARS-CoV-2 by FDA under an Emergency Use Authorization (EUA). This EUA will remain  in effect (meaning this test can be used) for the duration of the COVID-19 declaration under Section 564(b)(1) of the Act, 21 U.S.C.section 360bbb-3(b)(1), unless the authorization is terminated  or revoked sooner.       Influenza A by PCR NEGATIVE NEGATIVE Final   Influenza B by PCR NEGATIVE NEGATIVE Final    Comment: (NOTE) The Xpert Xpress SARS-CoV-2/FLU/RSV plus assay is intended as an aid in the diagnosis of influenza from Nasopharyngeal swab specimens and should not be used as a sole basis for treatment. Nasal washings and aspirates are unacceptable for Xpert Xpress SARS-CoV-2/FLU/RSV testing.  Fact Sheet for Patients: EntrepreneurPulse.com.au  Fact Sheet for Healthcare Providers: IncredibleEmployment.be  This test is not yet approved or cleared by the [place] FDA and has been authorized for detection and/or diagnosis of SARS-CoV-2 by FDA under an Emergency Use Authorization (EUA). This EUA will remain in effect (meaning this test can be used) for the duration of the COVID-19 declaration under Section 564(b)(1) of the Act, 21 U.S.C. section 360bbb-3(b)(1), unless the authorization is terminated or revoked.  Performed at [facility], [place] [address] [place], [place] [address]   Culture, blood (routine x 2)     Status: None (Preliminary result)   Collection Time: 03/25/20  3:00 PM   Specimen: BLOOD  Result Value Ref Range Status   Specimen Description   Final    BLOOD LEFT ANTECUBITAL Performed at [facility] [address] [place],  [address]  Special Requests   Final    BOTTLES DRAWN AEROBIC AND ANAEROBIC Blood Culture adequate volume Performed at [facility] [address]  [place], [place] [address]    Culture   Final    NO GROWTH < 12 HOURS Performed at [facility] [address] [place], [place] [address]    Report Status PENDING  Incomplete  Culture, blood (routine x 2)     Status: None (Preliminary result)   Collection Time: 03/25/20  3:00 PM   Specimen: BLOOD LEFT HAND  Result Value Ref Range Status   Specimen Description   Final    BLOOD LEFT HAND Performed at [facility] [address], [place], [place] [address]    Special Requests   Final    BOTTLES DRAWN AEROBIC AND ANAEROBIC Blood Culture adequate volume Performed at [facility] [address] [place], [place] [address]    Culture   Final    NO GROWTH < 12 HOURS Performed at [facility] [address], [place], [place] [address]    Report Status PENDING  Incomplete  Aerobic/Anaerobic Culture (surgical/deep wound)     Status: None (Preliminary result)   Collection Time: 03/25/20  8:12 PM   Specimen: Wound; Tissue  Result Value Ref Range Status   Specimen Description   Final    WOUND Performed at [facility] [address], [place], [place] [address]    Special Requests   Final    NONE Performed at [facility], [place] [address] [place], [place] [address]    Gram Stain   Final    NO WBC SEEN NO ORGANISMS SEEN Performed at [facility], [place] [address], [place],  [address]    Culture PENDING  Incomplete   Report Status PENDING  Incomplete   Assessment & Plan:  Patient was evaluated and treated and all questions answered.  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 Orders already in computer from outpatient procedure. -Continue empiric abx. Cultures pending -WBAT RLE in boot -NPO after midnight -PT/OT -Will continue to follow.  [name], DPM  Accessible via secure chat for questions or  concerns.

## 2020-03-26 NOTE — Consult Note (Addendum)
WOC consult requested for RLE wound prior to podiatry involvement.  Their team is now following for assessment and plan of care and they performed surgery yesterday.  Please refer to their team for further questions regarding plan of care.  Please re-consult if further assistance is needed.  Thank-you,  Julien Girt MSN, [place], [place], [place], [place]

## 2020-03-26 NOTE — Progress Notes (Signed)
ABI completed.   Please see CV Proc for preliminary results.   Jaeline Whobrey, RVT  

## 2020-03-26 NOTE — Progress Notes (Signed)
Inpatient Diabetes Program Recommendations  AACE/ADA: New Consensus Statement on Inpatient Glycemic Control (2015)  Target Ranges:  Prepandial:   less than 140 mg/dL      Peak postprandial:   less than 180 mg/dL (1-2 hours)      Critically ill patients:  140 - 180 mg/dL   Lab Results  Component Value Date   GLUCAP 118 (H) 03/26/2020   HGBA1C 7.9 (H) 03/25/2020    Review of Glycemic Control  Diabetes history: DM2 Outpatient Diabetes medications: Lantus 15 units bid, Humalog 0-12 units tidwc (not covered on insurance) Current orders for Inpatient glycemic control: Lantus 15 units bid, Novolog 0-9 units Q4H  HgbA1C - 7.9%  Inpatient Diabetes Program Recommendations:     Agree with orders.  Will need to find out why Medicaid is not covering pt's Humalog insulin. TOC consult.  Continue to follow.  Thank you. Lorenda Peck, RD, LDN, CDE Inpatient Diabetes Coordinator [phone]

## 2020-03-26 NOTE — Progress Notes (Signed)
[facility]  PROGRESS NOTE  [name] ZOX:[id number] DOB: [birth year] DOA: 03/25/2020 PCP: Gildardo Pounds, NP   Brief HPI:   49 year old male with past medical history of diabetes mellitus type 2, prior history of amputation, hypertension, CKD stage III presented with worsening right foot wound ongoing for past 1 week.  He had foul-smelling discharge coming from the wound.  He was seen by podiatry.  He underwent incision and drainage of right foot gas gangrene with partial fifth ray resection.    Subjective   Patient seen and examined, s/p I&D right foot gas gangrene with partial fifth ray resection.  Denies any pain.   Assessment/Plan:     1. 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  Patient underwent incision and drainage for gas gangrene of right foot along with partial fifth ray resection.  Podiatry following, plan for transmetatarsal amputation in a.m. Continue vancomycin.  Follow wound culture results. 2. Diabetes mellitus type 2-continue sliding scale insulin with NovoLog.  CBG well controlled. 3. CKD stage III-creatinine 2.67, at baseline. 4. Hypertension-continue Norvasc, Coreg, clonidine.  Blood pressure well controlled. 5. Neuropathy-continue gabapentin. 6. Tobacco abuse-nicotine patch     COVID-19 Labs  Recent Labs    03/25/20 2307  CRP 5.0*    Lab Results  Component Value Date   SARSCOV2NAA NEGATIVE 03/25/2020   SARSCOV2NAA NEGATIVE 03/14/2020   [place] NEGATIVE 06/29/2019   [place] NEGATIVE 06/29/2019     Scheduled medications:   . amLODipine  10 mg Oral Daily  . aspirin EC  81 mg Oral Daily  . atorvastatin  20 mg Oral Daily  . B-complex with vitamin C  1 tablet Oral Daily  . buPROPion  150 mg Oral Daily  . carvedilol  25 mg Oral BID  . cloNIDine  0.1 mg Oral BID  . escitalopram  10 mg Oral QHS  . ferrous sulfate  325 mg Oral Daily  . gabapentin  300 mg Oral TID  . heparin  5,000  Units Subcutaneous Q8H  . insulin aspart  0-9 Units Subcutaneous Q4H  . insulin glargine  15 Units Subcutaneous BID  . metroNIDAZOLE  500 mg Oral Q8H  . nicotine  7 mg Transdermal Daily  . senna-docusate  1 tablet Oral BID         CBG: Recent Labs  Lab 03/26/20 0013 03/26/20 0402 03/26/20 0714 03/26/20 1140 03/26/20 1627  GLUCAP 108* 139* 118* 121* 108*    SpO2: 98 % O2 Flow Rate (L/min): 10 L/min    CBC: Recent Labs  Lab 03/25/20 1500 03/26/20 0403  WBC 17.8* 16.6*  NEUTROABS 13.1*  --   HGB 9.4* 8.2*  HCT 30.1* 26.5*  MCV 87.0 88.6  PLT 428* 811    Basic Metabolic Panel: Recent Labs  Lab 03/25/20 1500 03/26/20 0403  NA 138 137  K 3.9 4.4  CL 110 112*  CO2 19* 17*  GLUCOSE 193* 160*  BUN 29* 26*  CREATININE 2.58* 2.67*  CALCIUM 8.6* 8.4*     Liver Function Tests: Recent Labs  Lab 03/25/20 1500 03/26/20 0403  AST 14* 13*  ALT 28 21  ALKPHOS 83 68  BILITOT 0.3 0.4  PROT 7.9 6.8  ALBUMIN 2.9* 2.6*     Antibiotics: Anti-infectives (From admission, onward)   Start     Dose/Rate Route Frequency Ordered Stop   03/27/20 1500  vancomycin (VANCOREADY) IVPB 1250 mg/250 mL        1,250 mg 166.7 mL/hr over 90  Minutes Intravenous Every 48 hours 03/25/20 2020     03/27/20 0600  clindamycin (CLEOCIN) IVPB 900 mg        900 mg 100 mL/hr over 30 Minutes Intravenous On call to O.R. 03/26/20 1633 03/28/20 0559   03/25/20 2200  metroNIDAZOLE (FLAGYL) tablet 500 mg        500 mg Oral Every 8 hours 03/25/20 1841     03/25/20 1845  cefTRIAXone (ROCEPHIN) 2 g in sodium chloride 0.9 % 100 mL IVPB        2 g 200 mL/hr over 30 Minutes Intravenous Every 24 hours 03/25/20 1841     03/25/20 1500  vancomycin (VANCOREADY) IVPB 1500 mg/300 mL        1,500 mg 150 mL/hr over 120 Minutes Intravenous  Once 03/25/20 1426 03/25/20 1744   03/25/20 1430  ceFEPIme (MAXIPIME) 2 g in sodium chloride 0.9 % 100 mL IVPB        2 g 200 mL/hr over 30 Minutes Intravenous  Once  03/25/20 1425 03/25/20 1510   03/25/20 1430  metroNIDAZOLE (FLAGYL) tablet 500 mg        500 mg Oral  Once 03/25/20 1425 03/25/20 1442       DVT prophylaxis: Heparin  Code Status: Full code  Family Communication: No family at bedside   Consultants:  Podiatry  Procedures:      Objective   Vitals:   03/26/20 0439 03/26/20 0817 03/26/20 1145 03/26/20 1631  BP: 137/85 135/81 138/81 116/74  Pulse: 81 78 76 72  Resp: 16 19 16 16   Temp: 98.4 F (36.9 C) 98.6 F (37 C) 98.1 F (36.7 C) 98 F (36.7 C)  TempSrc: Oral Oral Oral Oral  SpO2: 100% 100% 98% 98%  Weight:      Height:        Intake/Output Summary (Last 24 hours) at 03/26/2020 1745 Last data filed at 03/26/2020 1416 Gross per 24 hour  Intake 2200.83 ml  Output 250 ml  Net 1950.83 ml    12/27 1901 - 12/29 0700 In: 1720.8 [P.O.:600; I.V.:1020.8] Out: -   Filed Weights   03/25/20 1916  Weight: 90.7 kg    Physical Examination:    General-appears in no acute distress  Heart-S1-S2, regular, no murmur auscultated  Lungs-clear to auscultation bilaterally, no wheezing or crackles auscultated  Abdomen-soft, nontender, no organomegaly  Extremities-right foot in dressing  Neuro-alert, oriented x3, no focal deficit noted   Status is: Inpatient  Dispo: The patient is from: Home              Anticipated d/c is to: Home              Anticipated d/c date is: 04/01/2020              Patient currently not medically stable for discharge  Barrier to discharge-plan for transmetatarsal amputation in a.m.      Data Reviewed:   Recent Results (from the past 240 hour(s))  WOUND CULTURE     Status: Abnormal   Collection Time: 03/19/20 10:00 AM  Result Value Ref Range Status   MICRO NUMBER: [id number]  Final   SPECIMEN QUALITY: Adequate  Final   SOURCE: WOUND (SITE NOT SPECIFIED)  Final   STATUS: FINAL  Final   GRAM STAIN:   Final    Rare epithelial cells Rare Polymorphonuclear leukocytes Many Gram  negative bacilli Many Gram positive cocci in clusters   ISOLATE 1: Proteus mirabilis (A)  Final  Comment: Heavy growth of Proteus mirabilis      Susceptibility   Proteus mirabilis - AEROBIC CULT, GRAM STAIN NEGATIVE 1    AMPICILLIN  Resistant     AMPICILLIN/SULBACTAM <=2 Sensitive     CEFAZOLIN* 8 Resistant      * For infections other than uncomplicated UTIcaused by E. coli, K. pneumoniae or P. mirabilis:Cefazolin is resistant if MIC > or = 8 mcg/mL.(Distinguishing susceptible versus intermediatefor isolates with MIC < or = 4 mcg/mL requiresadditional testing.)    CEFEPIME <=1 Sensitive     CEFTRIAXONE <=1 Sensitive     CIPROFLOXACIN <=0.25 Sensitive     LEVOFLOXACIN <=0.12 Sensitive     ERTAPENEM <=0.5 Sensitive     GENTAMICIN <=1 Sensitive     PIP/TAZO <=4 Sensitive     TOBRAMYCIN <=1 Sensitive     TRIMETH/SULFA* <=20 Sensitive      * For infections other than uncomplicated UTIcaused by E. coli, K. pneumoniae or P. mirabilis:Cefazolin is resistant if MIC > or = 8 mcg/mL.(Distinguishing susceptible versus intermediatefor isolates with MIC < or = 4 mcg/mL requiresadditional testing.)Legend:S = Susceptible  I = IntermediateR = Resistant  NS = Not susceptible* = Not tested  NR = Not reported**NN = See antimicrobic comments  WOUND CULTURE     Status: None (Preliminary result)   Collection Time: 03/25/20 11:00 AM   Specimen: Foot; Wound  Result Value Ref Range Status   MICRO NUMBER: [id number]  Preliminary   SPECIMEN QUALITY: Adequate  Preliminary   SOURCE: ULCER RIGHT FOOT  Preliminary   STATUS: PRELIMINARY  Preliminary   GRAM STAIN:   Preliminary    Many White blood cells seen Rare epithelial cells Many Gram negative bacilli Many Gram positive cocci in clusters  Resp Panel by RT-PCR (Flu A&B, Covid) Nasopharyngeal Swab     Status: None   Collection Time: 03/25/20  2:24 PM   Specimen: Nasopharyngeal Swab; Nasopharyngeal(NP) swabs in vial transport medium  Result Value Ref Range Status    SARS Coronavirus 2 by RT PCR NEGATIVE NEGATIVE Final    Comment: (NOTE) SARS-CoV-2 target nucleic acids are NOT DETECTED.  The SARS-CoV-2 RNA is generally detectable in upper respiratory specimens during the acute phase of infection. The lowest concentration of SARS-CoV-2 viral copies this assay can detect is 138 copies/mL. A negative result does not preclude SARS-Cov-2 infection and should not be used as the sole basis for treatment or other patient management decisions. A negative result may occur with  improper specimen collection/handling, submission of specimen other than nasopharyngeal swab, presence of viral mutation(s) within the areas targeted by this assay, and inadequate number of viral copies(<138 copies/mL). A negative result must be combined with clinical observations, patient history, and epidemiological information. The expected result is Negative.  Fact Sheet for Patients:  EntrepreneurPulse.com.au  Fact Sheet for Healthcare Providers:  IncredibleEmployment.be  This test is no t yet approved or cleared by the [place] FDA and  has been authorized for detection and/or diagnosis of SARS-CoV-2 by FDA under an Emergency Use Authorization (EUA). This EUA will remain  in effect (meaning this test can be used) for the duration of the COVID-19 declaration under Section 564(b)(1) of the Act, 21 U.S.C.section 360bbb-3(b)(1), unless the authorization is terminated  or revoked sooner.       Influenza A by PCR NEGATIVE NEGATIVE Final   Influenza B by PCR NEGATIVE NEGATIVE Final    Comment: (NOTE) The Xpert Xpress SARS-CoV-2/FLU/RSV plus assay is intended as an aid in the  diagnosis of influenza from Nasopharyngeal swab specimens and should not be used as a sole basis for treatment. Nasal washings and aspirates are unacceptable for Xpert Xpress SARS-CoV-2/FLU/RSV testing.  Fact Sheet for  Patients: EntrepreneurPulse.com.au  Fact Sheet for Healthcare Providers: IncredibleEmployment.be  This test is not yet approved or cleared by the [place] FDA and has been authorized for detection and/or diagnosis of SARS-CoV-2 by FDA under an Emergency Use Authorization (EUA). This EUA will remain in effect (meaning this test can be used) for the duration of the COVID-19 declaration under Section 564(b)(1) of the Act, 21 U.S.C. section 360bbb-3(b)(1), unless the authorization is terminated or revoked.  Performed at [facility], [place] [address] [place], [place] [address]   Culture, blood (routine x 2)     Status: None (Preliminary result)   Collection Time: 03/25/20  3:00 PM   Specimen: BLOOD  Result Value Ref Range Status   Specimen Description   Final    BLOOD LEFT ANTECUBITAL Performed at [facility] [address] [place], [place] [address]    Special Requests   Final    BOTTLES DRAWN AEROBIC AND ANAEROBIC Blood Culture adequate volume Performed at [facility] [address] [place], [place] [address]    Culture   Final    NO GROWTH < 12 HOURS Performed at [facility] [address] [place], [place] [address]    Report Status PENDING  Incomplete  Culture, blood (routine x 2)     Status: None (Preliminary result)   Collection Time: 03/25/20  3:00 PM   Specimen: BLOOD LEFT HAND  Result Value Ref Range Status   Specimen Description   Final    BLOOD LEFT HAND Performed at [facility] [address] [place], [place] [address]    Special Requests   Final    BOTTLES DRAWN AEROBIC AND ANAEROBIC Blood Culture adequate volume Performed at [facility] [address] [place], [place] [address]    Culture   Final    NO GROWTH < 12 HOURS Performed at [facility] [address] [place], [place] [address]    Report  Status PENDING  Incomplete  Aerobic/Anaerobic Culture (surgical/deep wound)     Status: None (Preliminary result)   Collection Time: 03/25/20  8:12 PM   Specimen: Wound; Tissue  Result Value Ref Range Status   Specimen Description   Final    WOUND Performed at [facility] [address] [place], [place] [address]    Special Requests   Final    NONE Performed at [facility], [place] [address] [place], [place] [address]    Gram Stain   Final    NO WBC SEEN NO ORGANISMS SEEN Performed at [facility], [place] [address], [place], [place] [address]    Culture PENDING  Incomplete   Report Status PENDING  Incomplete    No results for input(s): LIPASE, AMYLASE in the last 168 hours. No results for input(s): AMMONIA in the last 168 hours.  Cardiac Enzymes: No results for input(s): CKTOTAL, CKMB, CKMBINDEX, TROPONINI in the last 168 hours. BNP (last 3 results) No results for input(s): BNP in the last 8760 hours.  ProBNP (last 3 results) No results for input(s): PROBNP in the last 8760 hours.  Studies:  DG Foot 2 Views Right  Result Date: 03/25/2020 CLINICAL DATA:  Transmetatarsal amputation EXAM: RIGHT FOOT - 2 VIEW  COMPARISON:  03/19/2020 FINDINGS: Previous amputation at the level of the MTP joint involving the first and third digits. Chronic dorsal displacement of the base of the second proximal phalanx with respect to the head of the metatarsal. Interval amputation of fifth digit at the level of the proximal shaft of the metatarsal. Vascular calcifications. IMPRESSION: 1. Interval amputation of fifth digit at the level of the proximal shaft of the metatarsal. 2. Previous amputation of the first and third digits. Chronic dislocation at the second MTP joint. Electronically Signed   By: Donavan Foil M.D.   On: 03/25/2020 21:26   DG Foot Complete Right  Result Date: 03/25/2020 Please see detailed radiograph report in office note.  VAS [place]  ABI WITH/WO TBI  Result Date: 03/26/2020 LOWER EXTREMITY DOPPLER STUDY Indications: Gangrene, and peripheral artery disease.  Vascular Interventions: Pre-Op TMA 12/30 s/p toe amputations. Performing Technologist: Vonzell Schlatter RVT  Examination Guidelines: A complete evaluation includes at minimum, Doppler waveform signals and systolic blood pressure reading at the level of bilateral brachial, anterior tibial, and posterior tibial arteries, when vessel segments are accessible. Bilateral testing is considered an integral part of a complete examination. Photoelectric Plethysmograph (PPG) waveforms and toe systolic pressure readings are included as required and additional duplex testing as needed. Limited examinations for reoccurring indications may be performed as noted.  ABI Findings: +---------+------------------+-----+---------+---------------------+ Right    Rt Pressure (mmHg)IndexWaveform Comment               +---------+------------------+-----+---------+---------------------+ Brachial 125                    triphasic                      +---------+------------------+-----+---------+---------------------+ PTA      154               1.23 triphasic                      +---------+------------------+-----+---------+---------------------+ DP       164               1.31 triphasic                      +---------+------------------+-----+---------+---------------------+ Great Toe                                recent toes amputated +---------+------------------+-----+---------+---------------------+ +--------+------------------+-----+---------+---------------+ Left    Lt Pressure (mmHg)IndexWaveform Comment         +--------+------------------+-----+---------+---------------+ Brachial                       triphasicIV and bandages +--------+------------------+-----+---------+---------------+ PTA     121               0.97 triphasic                 +--------+------------------+-----+---------+---------------+ DP      140               1.12 triphasic                +--------+------------------+-----+---------+---------------+  Summary: Right: Resting right ankle-brachial index indicates noncompressible right lower extremity arteries. Left: Resting left ankle-brachial index is within normal range. No evidence of significant left lower extremity arterial disease.  *See table(s) above for measurements and observations.  Electronically signed by Jamelle Haring on 03/26/2020 at 5:30:30  PM.   Final        Oswald Hillock   [facility] If 7PM-7AM, please contact night-coverage at [email or web address], Office  [phone]   03/26/2020, 5:45 PM  LOS: 1 day

## 2020-03-26 NOTE — Plan of Care (Signed)
  Problem: Education: Goal: Knowledge of General Education information will improve Description: Including pain rating scale, medication(s)/side effects and non-pharmacologic comfort measures Outcome: Progressing   Problem: Health Behavior/Discharge Planning: Goal: Ability to manage health-related needs will improve Outcome: Progressing   Problem: Clinical Measurements: Goal: Will remain free from infection Outcome: Progressing   

## 2020-03-27 ENCOUNTER — Encounter (HOSPITAL_COMMUNITY): Payer: Self-pay | Admitting: Internal Medicine

## 2020-03-27 ENCOUNTER — Encounter (HOSPITAL_COMMUNITY)
Admission: EM | Disposition: A | Discharge status: Home Health | Payer: Self-pay | Source: Home / Self Care | Attending: Internal Medicine

## 2020-03-27 ENCOUNTER — Inpatient Hospital Stay (HOSPITAL_COMMUNITY): Payer: Medicaid Other | Admitting: Physician Assistant

## 2020-03-27 ENCOUNTER — Ambulatory Visit (HOSPITAL_COMMUNITY): Admission: RE | Admit: 2020-03-27 | Payer: Medicaid Other | Source: Home / Self Care | Admitting: Podiatry

## 2020-03-27 ENCOUNTER — Inpatient Hospital Stay (HOSPITAL_COMMUNITY): Payer: Medicaid Other

## 2020-03-27 DIAGNOSIS — E0843 Diabetes mellitus due to underlying condition with diabetic autonomic (poly)neuropathy: Secondary | ICD-10-CM | POA: Diagnosis not present

## 2020-03-27 DIAGNOSIS — T148XXA Other injury of unspecified body region, initial encounter: Secondary | ICD-10-CM | POA: Diagnosis not present

## 2020-03-27 DIAGNOSIS — M86171 Other acute osteomyelitis, right ankle and foot: Secondary | ICD-10-CM | POA: Diagnosis not present

## 2020-03-27 DIAGNOSIS — Z9889 Other specified postprocedural states: Secondary | ICD-10-CM | POA: Diagnosis not present

## 2020-03-27 DIAGNOSIS — L97514 Non-pressure chronic ulcer of other part of right foot with necrosis of bone: Secondary | ICD-10-CM

## 2020-03-27 HISTORY — PX: TRANSMETATARSAL AMPUTATION: SHX6197

## 2020-03-27 LAB — GLUCOSE, CAPILLARY
Glucose-Capillary: 109 mg/dL — ABNORMAL HIGH (ref 70–99)
Glucose-Capillary: 123 mg/dL — ABNORMAL HIGH (ref 70–99)
Glucose-Capillary: 125 mg/dL — ABNORMAL HIGH (ref 70–99)
Glucose-Capillary: 125 mg/dL — ABNORMAL HIGH (ref 70–99)
Glucose-Capillary: 126 mg/dL — ABNORMAL HIGH (ref 70–99)
Glucose-Capillary: 147 mg/dL — ABNORMAL HIGH (ref 70–99)
Glucose-Capillary: 90 mg/dL (ref 70–99)

## 2020-03-27 LAB — CBC
HCT: 24.7 % — ABNORMAL LOW (ref 39.0–52.0)
Hemoglobin: 7.6 g/dL — ABNORMAL LOW (ref 13.0–17.0)
MCH: 27.2 pg (ref 26.0–34.0)
MCHC: 30.8 g/dL (ref 30.0–36.0)
MCV: 88.5 fL (ref 80.0–100.0)
Platelets: 376 10*3/uL (ref 150–400)
RBC: 2.79 MIL/uL — ABNORMAL LOW (ref 4.22–5.81)
RDW: 15 % (ref 11.5–15.5)
WBC: 14 10*3/uL — ABNORMAL HIGH (ref 4.0–10.5)
nRBC: 0 % (ref 0.0–0.2)

## 2020-03-27 LAB — BASIC METABOLIC PANEL
Anion gap: 7 (ref 5–15)
BUN: 28 mg/dL — ABNORMAL HIGH (ref 6–20)
CO2: 19 mmol/L — ABNORMAL LOW (ref 22–32)
Calcium: 8.1 mg/dL — ABNORMAL LOW (ref 8.9–10.3)
Chloride: 110 mmol/L (ref 98–111)
Creatinine, Ser: 2.89 mg/dL — ABNORMAL HIGH (ref 0.61–1.24)
GFR, Estimated: 26 mL/min — ABNORMAL LOW (ref >60)
Glucose, Bld: 121 mg/dL — ABNORMAL HIGH (ref 70–99)
Potassium: 3.9 mmol/L (ref 3.5–5.1)
Sodium: 136 mmol/L (ref 135–145)

## 2020-03-27 LAB — PREPARE RBC (CROSSMATCH)

## 2020-03-27 LAB — ABO/RH: ABO/RH(D): O POS

## 2020-03-27 SURGERY — AMPUTATION, FOOT, TRANSMETATARSAL
Anesthesia: General | Site: Toe | Laterality: Right

## 2020-03-27 MED ORDER — SODIUM CHLORIDE 0.9% IV SOLUTION: Freq: Once | INTRAVENOUS | Status: AC

## 2020-03-27 MED ORDER — MIDAZOLAM HCL 5 MG/5ML IJ SOLN
INTRAMUSCULAR | Status: DC | PRN
  Administered 2020-03-27: 2 mg via INTRAVENOUS

## 2020-03-27 MED ORDER — PROPOFOL 500 MG/50ML IV EMUL
INTRAVENOUS | Status: DC | PRN
  Administered 2020-03-27: 100 ug/kg/min via INTRAVENOUS

## 2020-03-27 MED ORDER — VANCOMYCIN HCL 1 G IV SOLR
INTRAVENOUS | Status: DC | PRN
  Administered 2020-03-27: 1000 mg via TOPICAL

## 2020-03-27 MED ORDER — BUPIVACAINE HCL (PF) 0.5 % IJ SOLN
INTRAMUSCULAR | Status: AC
  Filled 2020-03-27: qty 30

## 2020-03-27 MED ORDER — SODIUM CHLORIDE 0.9 % IR SOLN
Status: DC | PRN
  Administered 2020-03-27: 3000 mL

## 2020-03-27 MED ORDER — JUVEN PO PACK
1.0000 | PACK | Freq: Two times a day (BID) | ORAL | Status: DC
  Administered 2020-03-27 – 2020-04-02 (×7): 1 via ORAL
  Filled 2020-03-27 (×15): qty 1

## 2020-03-27 MED ORDER — PROMETHAZINE HCL 25 MG/ML IJ SOLN
6.2500 mg | INTRAMUSCULAR | Status: DC | PRN
Start: 2020-03-27 — End: 2020-03-27

## 2020-03-27 MED ORDER — OXYCODONE HCL 5 MG PO TABS: 5.0000 mg | ORAL_TABLET | Freq: Once | ORAL | Status: DC | PRN

## 2020-03-27 MED ORDER — PROSOURCE PLUS PO LIQD
30.0000 mL | Freq: Two times a day (BID) | ORAL | Status: DC
  Administered 2020-03-27 – 2020-03-31 (×8): 30 mL via ORAL
  Filled 2020-03-27 (×12): qty 30

## 2020-03-27 MED ORDER — ONDANSETRON HCL 4 MG/2ML IJ SOLN
INTRAMUSCULAR | Status: DC | PRN
  Administered 2020-03-27: 4 mg via INTRAVENOUS

## 2020-03-27 MED ORDER — OXYCODONE HCL 5 MG/5ML PO SOLN: 5.0000 mg | Freq: Once | ORAL | Status: DC | PRN

## 2020-03-27 MED ORDER — HYDROMORPHONE HCL 1 MG/ML IJ SOLN
0.2500 mg | INTRAMUSCULAR | Status: DC | PRN
Start: 2020-03-27 — End: 2020-03-27

## 2020-03-27 MED ORDER — BUPIVACAINE HCL (PF) 0.5 % IJ SOLN
INTRAMUSCULAR | Status: DC | PRN
Start: 2020-03-27 — End: 2020-03-27
  Administered 2020-03-27: 20 mL

## 2020-03-27 MED ORDER — 0.9 % SODIUM CHLORIDE (POUR BTL) OPTIME
TOPICAL | Status: DC | PRN
  Administered 2020-03-27: 1000 mL

## 2020-03-27 MED ORDER — AMISULPRIDE (ANTIEMETIC) 5 MG/2ML IV SOLN: 10.0000 mg | Freq: Once | INTRAVENOUS | Status: DC | PRN

## 2020-03-27 MED ORDER — LACTATED RINGERS IV SOLN: INTRAVENOUS | Status: DC | PRN

## 2020-03-27 MED ORDER — VANCOMYCIN HCL 1000 MG IV SOLR
INTRAVENOUS | Status: AC
  Filled 2020-03-27: qty 1000

## 2020-03-27 SURGICAL SUPPLY — 44 items
BLADE OSCILLATING/SAGITTAL (BLADE) ×2
BLADE SURG SZ10 CARB STEEL (BLADE) ×4 IMPLANT
BLADE SW THK.38XMED LNG THN (BLADE) IMPLANT
BNDG ELASTIC 4X5.8 VLCR STR LF (GAUZE/BANDAGES/DRESSINGS) ×2 IMPLANT
BNDG GAUZE ELAST 4 BULKY (GAUZE/BANDAGES/DRESSINGS) ×1 IMPLANT
CANISTER WOUND CARE 500ML ATS (WOUND CARE) ×1 IMPLANT
COVER MAYO STAND STRL (DRAPES) ×2 IMPLANT
COVER SURGICAL LIGHT HANDLE (MISCELLANEOUS) ×2 IMPLANT
COVER WAND RF STERILE (DRAPES) ×1 IMPLANT
CUFF TOURN SGL QUICK 18X4 (TOURNIQUET CUFF) ×2 IMPLANT
CUFF TOURN SGL QUICK 24 (TOURNIQUET CUFF)
CUFF TRNQT CYL 24X4X16.5-23 (TOURNIQUET CUFF) ×1 IMPLANT
DECANTER SPIKE VIAL GLASS SM (MISCELLANEOUS) ×1 IMPLANT
DRSG ADAPTIC 3X8 NADH LF (GAUZE/BANDAGES/DRESSINGS) ×1 IMPLANT
DRSG PAD ABDOMINAL 8X10 ST (GAUZE/BANDAGES/DRESSINGS) ×1 IMPLANT
DRSG VAC ATS MED SENSATRAC (GAUZE/BANDAGES/DRESSINGS) ×1 IMPLANT
ELECT REM PT RETURN 15FT ADLT (MISCELLANEOUS) ×2 IMPLANT
GAUZE SPONGE 4X4 12PLY STRL (GAUZE/BANDAGES/DRESSINGS) ×1 IMPLANT
GAUZE XEROFORM 5X9 LF (GAUZE/BANDAGES/DRESSINGS) IMPLANT
GLOVE BIO SURGEON STRL SZ7.5 (GLOVE) ×2 IMPLANT
GLOVE SRG 8 PF TXTR STRL LF DI (GLOVE) ×1 IMPLANT
GLOVE SURG UNDER POLY LF SZ8 (GLOVE) ×2
GOWN STRL REUS W/TWL XL LVL3 (GOWN DISPOSABLE) ×2 IMPLANT
HANDPIECE INTERPULSE COAX TIP (DISPOSABLE) ×2
KIT BASIN OR (CUSTOM PROCEDURE TRAY) ×2 IMPLANT
MANIFOLD NEPTUNE II (INSTRUMENTS) ×2 IMPLANT
MICROMATRIX 1000MG (Tissue) ×2 IMPLANT
NS IRRIG 1000ML POUR BTL (IV SOLUTION) ×2 IMPLANT
PACK ORTHO EXTREMITY (CUSTOM PROCEDURE TRAY) ×2 IMPLANT
PAD CAST 4YDX4 CTTN HI CHSV (CAST SUPPLIES) IMPLANT
PADDING CAST COTTON 4X4 STRL (CAST SUPPLIES) ×2
SET HNDPC FAN SPRY TIP SCT (DISPOSABLE) IMPLANT
SOLUTION PARTIC MCRMTRX 1000MG (Tissue) IMPLANT
STAPLER VISISTAT 35W (STAPLE) ×1 IMPLANT
STOCKINETTE 8 INCH (MISCELLANEOUS) ×2 IMPLANT
SUT ETHILON 2 0 PS N (SUTURE) ×4 IMPLANT
SUT ETHILON 3 0 PS 1 (SUTURE) ×2 IMPLANT
SUT VIC AB 3-0 CT1 27 (SUTURE) ×2
SUT VIC AB 3-0 CT1 TAPERPNT 27 (SUTURE) IMPLANT
SWAB COLLECTION DEVICE MRSA (MISCELLANEOUS) ×1 IMPLANT
SWAB CULTURE ESWAB REG 1ML (MISCELLANEOUS) ×1 IMPLANT
TOWEL OR 17X26 10 PK STRL BLUE (TOWEL DISPOSABLE) ×2 IMPLANT
TRAY PREP A LATEX SAFE STRL (SET/KITS/TRAYS/PACK) ×2 IMPLANT
UNDERPAD 30X36 HEAVY ABSORB (UNDERPADS AND DIAPERS) ×4 IMPLANT

## 2020-03-27 NOTE — Progress Notes (Addendum)
Initial Nutrition Assessment  DOCUMENTATION CODES:   Obesity unspecified  INTERVENTION:  - will order 30 ml Prosource Plus BID, each supplement provides 100 kcal and 15 grams protein.  - will order Juven BID, each packet provides 95 calories, 2.5 grams of protein (collagen), and 9.8 grams of carbohydrate (3 grams sugar); also contains 7 grams of L-arginine and L-glutamine, 300 mg vitamin C, 15 mg vitamin E, 1.2 mcg vitamin B-12, 9.5 mg zinc, 200 mg calcium, and 1.5 g  Calcium Beta-hydroxy-Beta-methylbutyrate to support wound healing.   NUTRITION DIAGNOSIS:   Increased nutrient needs related to acute illness,wound healing as evidenced by estimated needs.  GOAL:   Patient will meet greater than or equal to 90% of their needs  MONITOR:   PO intake,Supplement acceptance,Labs,Weight trends  REASON FOR ASSESSMENT:   Consult Wound healing  ASSESSMENT:   49 y.o. male with medical history of type 2 DM, prior hx of amputation, HTN, and stage 3 CKD. He smokes 5-6 cigarettes/day. He presented to the ED with complaints of worsening wound x1 week. Foul-smelling drainage from wound.  Patient out of the room to OR at the time of attempted visit. Patient with R foot osteomyelitis and ulceration. In OR for transmetatarsal amputation and wound vac placement.   Patient ate 50% of lunch yesterday and no other intakes documented since admission. MST score of 1.   He has not been seen by a [place] RD since 06/29/19. At that time he had been experiencing poor appetite x2-3 months.   Weight on admission date of 12/28 was 200 lb, which appears to be a stated weight although weight has been stable (197-201 lb) since 07/19/19. No information documented in the edema section of flow sheet.    Labs reviewed; CBGs: 125, 125, 109, 90 mg/dl, BUN: 28 mg/dl, creatinine: 2.89 mg/dl, Ca: 8.1 mg/dl, GFR: 26 ml/min.  Medications reviewed; 1 tablet vitamin B-complex with C, 325 mg ferrous sulfate/day, sliding  scale novolog, 15 units lantus BID, 1 tablet senokot BID. IVF; LR @ 50 ml/hr.     NUTRITION - FOCUSED PHYSICAL EXAM:  unable to complete at this time.   Diet Order:   Diet Order            Diet Carb Modified Fluid consistency: Thin; Room service appropriate? Yes  Diet effective now                 EDUCATION NEEDS:   No education needs have been identified at this time  Skin:  Skin Assessment: Skin Integrity Issues: Skin Integrity Issues:: Incisions Incisions: R foot (12/28 and 12/30)  Last BM:  PTA/unknown  Height:   Ht Readings from Last 1 Encounters:  03/25/20 5\' 4"  (1.626 m)    Weight:   Wt Readings from Last 1 Encounters:  03/25/20 90.7 kg    Estimated Nutritional Needs:  Kcal:  1900-2100 kcal Protein:  90-105 grams Fluid:  >/= 1.9 L/day      Jarome Matin, MS, RD, LDN, CNSC Inpatient Clinical Dietitian RD pager # available in AMION  After hours/weekend pager # available in [facility]

## 2020-03-27 NOTE — Progress Notes (Signed)
Subjective:  Patient ID: [name], male    DOB: January 24, 1971,  MRN: [id number]  Patient seen in pre-op. Understands plan for OR today. Denies overnight events and interval issues.  Objective:   Vitals:   03/27/20 0611 03/27/20 0643  BP: (!) 142/85 139/80  Pulse: 77 76  Resp: 16 18  Temp: 97.8 F (36.6 C) 98.3 F (36.8 C)  SpO2: 100% 100%   General AA&O x3. Normal mood and affect.  Vascular Foot warm and well perfused.  Neurologic Epicritic sensation grossly diminished.  Dermatologic Dressing C/D/I  Orthopedic: + motor to remaining toes and ankle   Results for orders placed or performed during the hospital encounter of 03/25/20  Resp Panel by RT-PCR (Flu A&B, Covid) Nasopharyngeal Swab     Status: None   Collection Time: 03/25/20  2:24 PM   Specimen: Nasopharyngeal Swab; Nasopharyngeal(NP) swabs in vial transport medium  Result Value Ref Range Status   SARS Coronavirus 2 by RT PCR NEGATIVE NEGATIVE Final    Comment: (NOTE) SARS-CoV-2 target nucleic acids are NOT DETECTED.  The SARS-CoV-2 RNA is generally detectable in upper respiratory specimens during the acute phase of infection. The lowest concentration of SARS-CoV-2 viral copies this assay can detect is 138 copies/mL. A negative result does not preclude SARS-Cov-2 infection and should not be used as the sole basis for treatment or other patient management decisions. A negative result may occur with  improper specimen collection/handling, submission of specimen other than nasopharyngeal swab, presence of viral mutation(s) within the areas targeted by this assay, and inadequate number of viral copies(<138 copies/mL). A negative result must be combined with clinical observations, patient history, and epidemiological information. The expected result is Negative.  Fact Sheet for Patients:  EntrepreneurPulse.com.au  Fact Sheet for Healthcare Providers:   IncredibleEmployment.be  This test is no t yet approved or cleared by the [place] FDA and  has been authorized for detection and/or diagnosis of SARS-CoV-2 by FDA under an Emergency Use Authorization (EUA). This EUA will remain  in effect (meaning this test can be used) for the duration of the COVID-19 declaration under Section 564(b)(1) of the Act, 21 U.S.C.section 360bbb-3(b)(1), unless the authorization is terminated  or revoked sooner.       Influenza A by PCR NEGATIVE NEGATIVE Final   Influenza B by PCR NEGATIVE NEGATIVE Final    Comment: (NOTE) The Xpert Xpress SARS-CoV-2/FLU/RSV plus assay is intended as an aid in the diagnosis of influenza from Nasopharyngeal swab specimens and should not be used as a sole basis for treatment. Nasal washings and aspirates are unacceptable for Xpert Xpress SARS-CoV-2/FLU/RSV testing.  Fact Sheet for Patients: EntrepreneurPulse.com.au  Fact Sheet for Healthcare Providers: IncredibleEmployment.be  This test is not yet approved or cleared by the [place] FDA and has been authorized for detection and/or diagnosis of SARS-CoV-2 by FDA under an Emergency Use Authorization (EUA). This EUA will remain in effect (meaning this test can be used) for the duration of the COVID-19 declaration under Section 564(b)(1) of the Act, 21 U.S.C. section 360bbb-3(b)(1), unless the authorization is terminated or revoked.  Performed at [facility], [place] [address] [place], [place] [address]   Culture, blood (routine x 2)     Status: None (Preliminary result)   Collection Time: 03/25/20  3:00 PM   Specimen: BLOOD  Result Value Ref Range Status   Specimen Description   Final    BLOOD LEFT ANTECUBITAL Performed at [facility] [address]  [place], [place] [address]    Special Requests   Final    BOTTLES DRAWN AEROBIC AND ANAEROBIC Blood Culture  adequate volume Performed at [facility] [address], [place], [place] [address]    Culture   Final    NO GROWTH 2 DAYS Performed at [facility] [address], [place], [place] [address]    Report Status PENDING  Incomplete  Culture, blood (routine x 2)     Status: None (Preliminary result)   Collection Time: 03/25/20  3:00 PM   Specimen: BLOOD LEFT HAND  Result Value Ref Range Status   Specimen Description   Final    BLOOD LEFT HAND Performed at [facility] [address] [place], [place] [address]    Special Requests   Final    BOTTLES DRAWN AEROBIC AND ANAEROBIC Blood Culture adequate volume Performed at [facility] [address], [place], [place] [address]    Culture   Final    NO GROWTH 2 DAYS Performed at [facility] [address] [place], [place] [address]    Report Status PENDING  Incomplete  Aerobic/Anaerobic Culture (surgical/deep wound)     Status: None (Preliminary result)   Collection Time: 03/25/20  8:12 PM   Specimen: Wound; Tissue  Result Value Ref Range Status   Specimen Description   Final    WOUND Performed at [facility] [address], [place], [place] [address]    Special Requests   Final    NONE Performed at [facility], [place] [address], [place], [place] [address]    Gram Stain   Final    NO WBC SEEN NO ORGANISMS SEEN Performed at [facility], [place] [address], [place],  [address]    Culture PENDING  Incomplete   Report Status PENDING  Incomplete   Assessment & Plan:  Patient was evaluated and treated and all questions answered.  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 Orders already in computer from outpatient procedure. -Continue empiric abx. Cultures pending -WBAT RLE in boot after surgery. -PT/OT -Will continue to follow.  [name],  DPM  Accessible via secure chat for questions or concerns.

## 2020-03-27 NOTE — Anesthesia Preprocedure Evaluation (Signed)
Anesthesia Evaluation  Patient identified by MRN, date of birth, ID band Patient awake    Reviewed: Allergy & Precautions, NPO status , Patient's Chart, lab work & pertinent test results, reviewed documented beta blocker date and time   History of Anesthesia Complications Negative for: history of anesthetic complications  Airway Mallampati: I  TM Distance: >3 FB Neck ROM: Full    Dental  (+) Poor Dentition, Missing, Dental Advisory Given, Chipped   Pulmonary Current SmokerPatient did not abstain from smoking.,  03/25/2020 SARS coronavirus NEG   breath sounds clear to auscultation       Cardiovascular hypertension, Pt. on medications and Pt. on home beta blockers (-) angina+ Peripheral Vascular Disease   Rhythm:Regular Rate:Normal  12/2019 ECHO: EF 60-65%, mod LVH, Grade 1 DD, no significant valvular abnormalities  03/18/2020 Stress: EF: 55%. no ST segment deviation noted during stress.The study is normal, EF normal (55-65%). low risk study.     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)     Musculoskeletal   Abdominal (+) + obese,   Peds  Hematology  (+) Blood dyscrasia (Hb 9.4), anemia ,   Anesthesia Other Findings   Reproductive/Obstetrics                             Anesthesia Physical  Anesthesia Plan  ASA: III  Anesthesia Plan: General   Post-op Pain Management:    Induction: Intravenous  PONV Risk Score and Plan: 1 and Ondansetron and Treatment may vary due to age or medical condition  Airway Management Planned: LMA  Additional Equipment: None  Intra-op Plan:   Post-operative Plan:   Informed Consent: I have reviewed the patients History and Physical, chart, labs and discussed the procedure  including the risks, benefits and alternatives for the proposed anesthesia with the patient or authorized representative who has indicated his/her understanding and acceptance.     Dental advisory given  Plan Discussed with: CRNA and Surgeon  Anesthesia Plan Comments:         Anesthesia Quick Evaluation

## 2020-03-27 NOTE — H&P (Signed)
Anesthesia H&P Update: History and Physical Exam reviewed; patient is OK for planned anesthetic and procedure. ? ?

## 2020-03-27 NOTE — TOC Progression Note (Signed)
Transition of Care [facility]) - Progression Note    Patient Details  Name: IMMANUEL FEDAK MRN: [id number] Date of Birth: [birth year]  Transition of [facility]) CM/SW Contact  Purcell Mouton, RN Phone Number: 03/27/2020, 3:11 PM  Clinical Narrative:     Spoke with pt concerning discharge plans. Pt states that he will go home. TOC will continue to follow.   Expected Discharge Plan: [place] Barriers to Discharge: No Barriers Identified  Expected Discharge Plan and Services Expected Discharge Plan: [place] arrangements for the past 2 months: Single Family Home                                       Social Determinants of Health (SDOH) Interventions    Readmission Risk Interventions No flowsheet data found.

## 2020-03-27 NOTE — Op Note (Signed)
Patient Name: [name] DOB: 1971/02/25  MRN: [id number]   Date of Surgery: 03/27/20  Surgeon: Dr. Hardie Pulley, DPM Assistants: none  Pre-operative Diagnosis:  Osteomyelitis, ulceration right foot Post-operative Diagnosis:  same Procedures:  1) Transmetatarsal amputation right foot  2) Medial Plantar artery rotation flap  3) Application of wound VAC Pathology/Specimens: ID Type Source Tests Collected by Time Destination  1 : Forefoot right foot Amputation Foot, Right SURGICAL PATHOLOGY [name], DPM 03/27/2020 9767   2 : Right 5th metatarsal access for viable margins ink side distal Tissue Foot, Right SURGICAL PATHOLOGY [name], DPM 03/27/2020 3419   A : Right 5th metatarsal wound Wound Foot, Right GRAM STAIN (Canceled), AEROBIC/ANAEROBIC CULTURE (SURGICAL/DEEP WOUND) [name], DPM 03/27/2020 0830    Anesthesia: MAC/local Hemostasis:  Total Tourniquet Time Documented: Calf (Right) - 70 minutes Total: Calf (Right) - 70 minutes  Estimated Blood Loss: 25 mL Materials:  Implant Name Type Inv. Item Serial No. Manufacturer Lot No. LRB No. Used Action  MICROMATRIX 1000MG - [id number] Tissue MICROMATRIX 1000MG [id number] Right 1 Implanted   Medications: 1g vancomycin powder, 20 ml marcaine 0.5% plain. Complications: none  Indications for Procedure:  This is a 49 y.o. male with an infection to the right foot. He previously underwent incision and drainage for osteomyelitis. He presents today for planned conversion to TMA.   Procedure in Detail: Patient was identified in pre-operative holding area. Formal consent was signed and the right lower extremity was marked. Patient was brought back to the operating room. Anesthesia was induced. The extremity was prepped and draped in the usual sterile fashion. Timeout was taken to confirm patient name, laterality, and procedure prior to incision.   Attention was then directed to the right foot.  There was a large wound laterally with exposed 5th metatarsal and some necrosis of the wound bed. The wound was first copiously irrigated with 3L of normal saline via pulse lavage. This was excisionally sharply debrided with a 15 blade. The resultant wound deficit measured 9x5. The wound extent was to bone with necrosis of the 5th metatarsal noted. To cover this deficit a medial plantar artery flap was planned in lieu of the traditional transmetatarsal amputation flap.  The flaps were planned with care to preserve the medial plantar artery angiosome. An incision was made at the distal aspect of the first metatarsal. The incision was slightly curved at the MPJ level so as not to include the remaining digits in the flaps. Full thickness dorsal and plantar flaps were raised off of the bones. The skin was raised off of the 1st metatarsal medially with care to preserve the skin at this area. The remaining digits were disarticulated at the metatarsophalangeal joints. Full thickness flaps were raised off of the remaining metatarsals. Hemostasis was achieved with cautery. The plantar plates and associated tendons were excised from the plantar portion of the flap.  The 1st, 2nd, 3rd, and 4th metatarsals were then each transected with care to preserve the metatarsal parabola. The distal metatarssals were excised in toto. After transection of the 1st-4th metatarsals, the 5th metatarsal which was much shorter than the others was transected to a point of hard viable bone. The resected piece was inked and sent for pathology to assess for viable resection margin. A curette was used to obtain a piece of the cancellous bone of the remaining 5th metatarsal for micro culture.  The wound was then thoroughly irrigated for another 3L of normal saline via pulse  lavage. The medial plantar flap was rotated into position over the deficit. The excess skin was excised and the skin edges remodeled. The skin was then undermined to allow  for layered closure. Deep closure was performed with 3-0 vicryl to cover the remaining bones. Skin closure was performed with 3-0 nylon mattress suture and skin staples. The lateral wound was much improved, but was left open to allow for delayed healing. The residual wound was 3x0.5x0.5  A wound VAC sponge was applied to the wound bed and along the incision over adaptic. Adherent dressing was applied, a suction hole was pierced into the drape. The trackpad was applied over this area and set to suction at 125 mmHg with good seal noted.  The foot was then dressed with cast padding and ACE bandage. Patient tolerated the procedure well.  Disposition: Following a period of post-operative monitoring, patient will be transferred back to the floor. He will need continued care at least until dressing change on Saturday. Depending on results of the bone culture he may need continued abx therapy.  He will likely benefit from delayed closureof the lateral wound at a later date.

## 2020-03-27 NOTE — Progress Notes (Signed)
Orthopedic Tech Progress Note Patient Details:  [name] 09-03-1970 [id number] Will apply when drain is pulled out. Ortho Devices Type of Ortho Device: CAM walker Ortho Device/Splint Location: RLE Ortho Device/Splint Interventions: Ordered   Post Interventions Instructions Provided: Care of device   [name] 03/27/2020, 11:38 AM

## 2020-03-27 NOTE — Anesthesia Postprocedure Evaluation (Signed)
Anesthesia Post Note  Patient: [name]  Procedure(s) Performed: TRANSMETATARSAL AMPUTATION RIGHT FOOT, MEDIAL PLANTAR ARTERY FLAP, APPLICATION OF WOUND VAC  (Right Toe)     Patient location during evaluation: PACU Anesthesia Type: General Level of consciousness: awake and alert Pain management: pain level controlled Vital Signs Assessment: post-procedure vital signs reviewed and stable Respiratory status: spontaneous breathing, nonlabored ventilation and respiratory function stable Cardiovascular status: blood pressure returned to baseline and stable Postop Assessment: no apparent nausea or vomiting Anesthetic complications: no   No complications documented.  Last Vitals:  Vitals:   03/27/20 1015 03/27/20 1153  BP: 125/77 (!) 141/86  Pulse: 68 69  Resp: 11 17  Temp:  36.4 C  SpO2: 100% 100%    Last Pain:  Vitals:   03/27/20 1153  TempSrc: Oral  PainSc:                  Lynda Rainwater

## 2020-03-27 NOTE — Brief Op Note (Signed)
03/27/2020  9:43 AM  PATIENT:  [name]  49 y.o. male  PRE-OPERATIVE DIAGNOSIS:  ULCER RIGHT FOOT  POST-OPERATIVE DIAGNOSIS:  ULCER RIGHT FOOT  PROCEDURE:  Procedure(s): TRANSMETATARSAL AMPUTATION RIGHT FOOT, MEDIAL PLANTAR ARTERY FLAP, APPLICATION OF WOUND VAC  (Right)  SURGEON:  Surgeon(s) and Role:    * [name], DPM - Primary  PHYSICIAN ASSISTANT:   ASSISTANTS: none   ANESTHESIA:   local and MAC  EBL:  25 mL   BLOOD ADMINISTERED:none  DRAINS: Wound VAC   LOCAL MEDICATIONS USED:  MARCAINE    and Amount: 20 ml  SPECIMEN:   ID Type Source Tests Collected by Time Destination  1 : Forefoot right foot Amputation Foot, Right SURGICAL PATHOLOGY [name], DPM 03/27/2020 7414   2 : Right 5th metatarsal access for viable margins ink side distal Tissue Foot, Right SURGICAL PATHOLOGY [name], DPM 03/27/2020 2395   A : Right 5th metatarsal wound Wound Foot, Right GRAM STAIN (Canceled), AEROBIC/ANAEROBIC CULTURE (SURGICAL/DEEP WOUND) [name], DPM 03/27/2020 0830       DISPOSITION OF SPECIMEN:  As above  COUNTS:  YES  TOURNIQUET:   Total Tourniquet Time Documented: Calf (Right) - 70 minutes Total: Calf (Right) - 70 minutes   DICTATION: .Viviann Spare Dictation  PLAN OF CARE: Admit to inpatient   PATIENT DISPOSITION:  PACU - hemodynamically stable.   Delay start of Pharmacological VTE agent (>24hrs) due to surgical blood loss or risk of bleeding: yes

## 2020-03-27 NOTE — TOC Progression Note (Signed)
Transition of Care [facility]) - Progression Note    Patient Details  Name: [name] MRN: [id number] Date of Birth: [birth year]  [facility]) CM/SW Contact  Purcell Mouton, RN Phone Number: 03/27/2020, 1:14 PM  Clinical Narrative:    TOC will follow for disposition and discharge needs.   Expected Discharge Plan: [place] Barriers to Discharge: No Barriers Identified  Expected Discharge Plan and Services Expected Discharge Plan: [place] arrangements for the past 2 months: Single Family Home                                       Social Determinants of Health (SDOH) Interventions    Readmission Risk Interventions No flowsheet data found.

## 2020-03-27 NOTE — Progress Notes (Signed)
[facility]  PROGRESS NOTE  [name] CZY:[id number] DOB: 22-Jan-1971 DOA: 03/25/2020 PCP: Gildardo Pounds, NP   Brief HPI:   49 year old male with past medical history of diabetes mellitus type 2, prior history of amputation, hypertension, CKD stage III presented with worsening right foot wound ongoing for past 1 week.  He had foul-smelling discharge coming from the wound.  He was seen by podiatry.  He underwent incision and drainage of right foot gas gangrene with partial fifth ray resection.    Subjective   Patient seen and examined, underwent neurosurgery, transmetatarsal amputation of right foot, application of wound VAC.   Assessment/Plan:     1. 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  Patient underwent incision and drainage for gas gangrene of right foot along with partial fifth ray resection.  Podiatry took patient for surgery, he underwent transmetatarsal amputation of right foot.discharge continue vancomycin.  Follow wound culture results. 2. Diabetes mellitus type 2-continue sliding scale insulin with NovoLog.  CBG well controlled. 3. CKD stage III-creatinine 2.67, at baseline. 4. Hypertension-continue Norvasc, Coreg, clonidine.  Blood pressure well controlled. 5. Neuropathy-continue gabapentin. 6. Tobacco abuse-nicotine patch     COVID-19 Labs  Recent Labs    03/25/20 2307  CRP 5.0*    Lab Results  Component Value Date   SARSCOV2NAA NEGATIVE 03/25/2020   SARSCOV2NAA NEGATIVE 03/14/2020   SARSCOV2NAA NEGATIVE 06/29/2019   [place] NEGATIVE 06/29/2019     Scheduled medications:   . (feeding supplement) PROSource Plus  30 mL Oral BID BM  . amLODipine  10 mg Oral Daily  . aspirin EC  81 mg Oral Daily  . atorvastatin  20 mg Oral Daily  . B-complex with vitamin C  1 tablet Oral Daily  . buPROPion  150 mg Oral Daily  . carvedilol  25 mg Oral BID  . Chlorhexidine Gluconate Cloth  6 each Topical UD   . cloNIDine  0.1 mg Oral BID  . escitalopram  10 mg Oral QHS  . ferrous sulfate  325 mg Oral Daily  . gabapentin  300 mg Oral TID  . heparin  5,000 Units Subcutaneous Q8H  . insulin aspart  0-9 Units Subcutaneous Q4H  . insulin glargine  15 Units Subcutaneous BID  . metroNIDAZOLE  500 mg Oral Q8H  . nicotine  7 mg Transdermal Daily  . nutrition supplement (JUVEN)  1 packet Oral BID BM  . senna-docusate  1 tablet Oral BID         CBG: Recent Labs  Lab 03/26/20 2353 03/27/20 0435 03/27/20 0641 03/27/20 0940 03/27/20 1119  GLUCAP 143* 125* 125* 109* 90    SpO2: 100 % O2 Flow Rate (L/min): 2 L/min    CBC: Recent Labs  Lab 03/25/20 1500 03/26/20 0403 03/27/20 0426  WBC 17.8* 16.6* 14.0*  NEUTROABS 13.1*  --   --   HGB 9.4* 8.2* 7.6*  HCT 30.1* 26.5* 24.7*  MCV 87.0 88.6 88.5  PLT 428* 382 093    Basic Metabolic Panel: Recent Labs  Lab 03/25/20 1500 03/26/20 0403 03/27/20 0426  NA 138 137 136  K 3.9 4.4 3.9  CL 110 112* 110  CO2 19* 17* 19*  GLUCOSE 193* 160* 121*  BUN 29* 26* 28*  CREATININE 2.58* 2.67* 2.89*  CALCIUM 8.6* 8.4* 8.1*     Liver Function Tests: Recent Labs  Lab 03/25/20 1500 03/26/20 0403  AST 14* 13*  ALT 28 21  ALKPHOS 83 68  BILITOT 0.3 0.4  PROT 7.9 6.8  ALBUMIN 2.9* 2.6*     Antibiotics: Anti-infectives (From admission, onward)   Start     Dose/Rate Route Frequency Ordered Stop   03/27/20 1500  vancomycin (VANCOREADY) IVPB 1250 mg/250 mL        1,250 mg 166.7 mL/hr over 90 Minutes Intravenous Every 48 hours 03/25/20 2020     03/27/20 0815  vancomycin (VANCOCIN) powder  Status:  Discontinued          As needed 03/27/20 0815 03/27/20 1103   03/27/20 0600  clindamycin (CLEOCIN) IVPB 900 mg        900 mg 100 mL/hr over 30 Minutes Intravenous On call to O.R. 03/26/20 1633 03/27/20 0745   03/25/20 2200  metroNIDAZOLE (FLAGYL) tablet 500 mg        500 mg Oral Every 8 hours 03/25/20 1841     03/25/20 1845  cefTRIAXone  (ROCEPHIN) 2 g in sodium chloride 0.9 % 100 mL IVPB        2 g 200 mL/hr over 30 Minutes Intravenous Every 24 hours 03/25/20 1841     03/25/20 1500  vancomycin (VANCOREADY) IVPB 1500 mg/300 mL        1,500 mg 150 mL/hr over 120 Minutes Intravenous  Once 03/25/20 1426 03/25/20 1744   03/25/20 1430  ceFEPIme (MAXIPIME) 2 g in sodium chloride 0.9 % 100 mL IVPB        2 g 200 mL/hr over 30 Minutes Intravenous  Once 03/25/20 1425 03/25/20 1510   03/25/20 1430  metroNIDAZOLE (FLAGYL) tablet 500 mg        500 mg Oral  Once 03/25/20 1425 03/25/20 1442       DVT prophylaxis: Heparin  Code Status: Full code  Family Communication: No family at bedside   Consultants:  Podiatry  Procedures:      Objective   Vitals:   03/27/20 1000 03/27/20 1015 03/27/20 1153 03/27/20 1241  BP: (!) 141/82 125/77 (!) 141/86 (!) 150/88  Pulse: 69 68 69 72  Resp: 15 11 17    Temp:   97.6 F (36.4 C)   TempSrc:   Oral   SpO2: 100% 100% 100% 100%  Weight:      Height:        Intake/Output Summary (Last 24 hours) at 03/27/2020 1446 Last data filed at 03/27/2020 2458 Gross per 24 hour  Intake 1379.17 ml  Output 260 ml  Net 1119.17 ml    12/28 1901 - 12/30 0700 In: 0998 [P.O.:1380; I.V.:2000] Out: 485 [Urine:485]  Filed Weights   03/25/20 1916  Weight: 90.7 kg    Physical Examination:    General-appears in no acute distress  Heart-S1-S2, regular, no murmur auscultated  Lungs-clear to auscultation bilaterally, no wheezing or crackles auscultated  Abdomen-soft, nontender, no organomegaly  Extremities-right foot in dressing  Neuro-alert, oriented x3, no focal deficit noted   Status is: Inpatient  Dispo: The patient is from: Home              Anticipated d/c is to: Home              Anticipated d/c date is: 04/01/2020              Patient currently not medically stable for discharge  Barrier to discharge-plan for transmetatarsal amputation in a.m.      Data Reviewed:    Recent Results (from the past 240 hour(s))  WOUND CULTURE     Status: Abnormal   Collection Time: 03/19/20 10:00  AM  Result Value Ref Range Status   MICRO NUMBER: [id number]  Final   SPECIMEN QUALITY: Adequate  Final   SOURCE: WOUND (SITE NOT SPECIFIED)  Final   STATUS: FINAL  Final   GRAM STAIN:   Final    Rare epithelial cells Rare Polymorphonuclear leukocytes Many Gram negative bacilli Many Gram positive cocci in clusters   ISOLATE 1: Proteus mirabilis (A)  Final    Comment: Heavy growth of Proteus mirabilis      Susceptibility   Proteus mirabilis - AEROBIC CULT, GRAM STAIN NEGATIVE 1    AMPICILLIN  Resistant     AMPICILLIN/SULBACTAM <=2 Sensitive     CEFAZOLIN* 8 Resistant      * For infections other than uncomplicated UTIcaused by E. coli, K. pneumoniae or P. mirabilis:Cefazolin is resistant if MIC > or = 8 mcg/mL.(Distinguishing susceptible versus intermediatefor isolates with MIC < or = 4 mcg/mL requiresadditional testing.)    CEFEPIME <=1 Sensitive     CEFTRIAXONE <=1 Sensitive     CIPROFLOXACIN <=0.25 Sensitive     LEVOFLOXACIN <=0.12 Sensitive     ERTAPENEM <=0.5 Sensitive     GENTAMICIN <=1 Sensitive     PIP/TAZO <=4 Sensitive     TOBRAMYCIN <=1 Sensitive     TRIMETH/SULFA* <=20 Sensitive      * For infections other than uncomplicated UTIcaused by E. coli, K. pneumoniae or P. mirabilis:Cefazolin is resistant if MIC > or = 8 mcg/mL.(Distinguishing susceptible versus intermediatefor isolates with MIC < or = 4 mcg/mL requiresadditional testing.)Legend:S = Susceptible  I = IntermediateR = Resistant  NS = Not susceptible* = Not tested  NR = Not reported**NN = See antimicrobic comments  WOUND CULTURE     Status: None (Preliminary result)   Collection Time: 03/25/20 11:00 AM   Specimen: Foot; Wound  Result Value Ref Range Status   MICRO NUMBER: [id number]  Preliminary   SPECIMEN QUALITY: Adequate  Preliminary   SOURCE: ULCER RIGHT FOOT  Preliminary   STATUS: PRELIMINARY   Preliminary   GRAM STAIN:   Preliminary    Many White blood cells seen Rare epithelial cells Many Gram negative bacilli Many Gram positive cocci in clusters  Resp Panel by RT-PCR (Flu A&B, Covid) Nasopharyngeal Swab     Status: None   Collection Time: 03/25/20  2:24 PM   Specimen: Nasopharyngeal Swab; Nasopharyngeal(NP) swabs in vial transport medium  Result Value Ref Range Status   SARS Coronavirus 2 by RT PCR NEGATIVE NEGATIVE Final    Comment: (NOTE) SARS-CoV-2 target nucleic acids are NOT DETECTED.  The SARS-CoV-2 RNA is generally detectable in upper respiratory specimens during the acute phase of infection. The lowest concentration of SARS-CoV-2 viral copies this assay can detect is 138 copies/mL. A negative result does not preclude SARS-Cov-2 infection and should not be used as the sole basis for treatment or other patient management decisions. A negative result may occur with  improper specimen collection/handling, submission of specimen other than nasopharyngeal swab, presence of viral mutation(s) within the areas targeted by this assay, and inadequate number of viral copies(<138 copies/mL). A negative result must be combined with clinical observations, patient history, and epidemiological information. The expected result is Negative.  Fact Sheet for Patients:  EntrepreneurPulse.com.au  Fact Sheet for Healthcare Providers:  IncredibleEmployment.be  This test is no t yet approved or cleared by the [place] FDA and  has been authorized for detection and/or diagnosis of SARS-CoV-2 by FDA under an Emergency Use Authorization (EUA). This EUA will remain  in effect (meaning this test can be used) for the duration of the COVID-19 declaration under Section 564(b)(1) of the Act, 21 U.S.C.section 360bbb-3(b)(1), unless the authorization is terminated  or revoked sooner.       Influenza A by PCR NEGATIVE NEGATIVE Final   Influenza B by PCR  NEGATIVE NEGATIVE Final    Comment: (NOTE) The Xpert Xpress SARS-CoV-2/FLU/RSV plus assay is intended as an aid in the diagnosis of influenza from Nasopharyngeal swab specimens and should not be used as a sole basis for treatment. Nasal washings and aspirates are unacceptable for Xpert Xpress SARS-CoV-2/FLU/RSV testing.  Fact Sheet for Patients: EntrepreneurPulse.com.au  Fact Sheet for Healthcare Providers: IncredibleEmployment.be  This test is not yet approved or cleared by the [place] FDA and has been authorized for detection and/or diagnosis of SARS-CoV-2 by FDA under an Emergency Use Authorization (EUA). This EUA will remain in effect (meaning this test can be used) for the duration of the COVID-19 declaration under Section 564(b)(1) of the Act, 21 U.S.C. section 360bbb-3(b)(1), unless the authorization is terminated or revoked.  Performed at [name], [place] [address], [place], [place] [address]   Culture, blood (routine x 2)     Status: None (Preliminary result)   Collection Time: 03/25/20  3:00 PM   Specimen: BLOOD  Result Value Ref Range Status   Specimen Description   Final    BLOOD LEFT ANTECUBITAL Performed at [facility] [address] [place], [place] [address]    Special Requests   Final    BOTTLES DRAWN AEROBIC AND ANAEROBIC Blood Culture adequate volume Performed at [facility] [address] [place], [place] [address]    Culture   Final    NO GROWTH 2 DAYS Performed at [facility] [address], [place], [place] [address]    Report Status PENDING  Incomplete  Culture, blood (routine x 2)     Status: None (Preliminary result)   Collection Time: 03/25/20  3:00 PM   Specimen: BLOOD LEFT HAND  Result Value Ref Range Status   Specimen Description   Final    BLOOD LEFT HAND Performed at [facility] [address],  [place], [place] [address]    Special Requests   Final    BOTTLES DRAWN AEROBIC AND ANAEROBIC Blood Culture adequate volume Performed at [facility] [address], [place], [place] [address]    Culture   Final    NO GROWTH 2 DAYS Performed at [facility] [address], [place], [place] [address]    Report Status PENDING  Incomplete  Aerobic/Anaerobic Culture (surgical/deep wound)     Status: None (Preliminary result)   Collection Time: 03/25/20  8:12 PM   Specimen: Wound; Tissue  Result Value Ref Range Status   Specimen Description   Final    WOUND Performed at [facility] [address] [place], [place] [address]    Special Requests   Final    NONE Performed at [facility], [place] [address] [place], [place] [address]    Gram Stain NO WBC SEEN NO ORGANISMS SEEN   Final   Culture   Final    CULTURE REINCUBATED FOR BETTER GROWTH Performed at [facility], [place] [address], [place], [place] [address]    Report Status PENDING  Incomplete    No results for input(s): LIPASE, AMYLASE in the last 168 hours. No results  for input(s): AMMONIA in the last 168 hours.  Cardiac Enzymes: No results for input(s): CKTOTAL, CKMB, CKMBINDEX, TROPONINI in the last 168 hours. BNP (last 3 results) No results for input(s): BNP in the last 8760 hours.  ProBNP (last 3 results) No results for input(s): PROBNP in the last 8760 hours.  Studies:  DG Foot 2 Views Right  Result Date: 03/27/2020 CLINICAL DATA:  Postop transmetatarsal amputation EXAM: RIGHT FOOT - 2 VIEW COMPARISON:  None. FINDINGS: Interval transmetatarsal amputation. No bone destruction. No periosteal reaction. No fracture or dislocation. Postsurgical changes in the overlying soft tissues. Small plantar calcaneal spur. Lateral wound VAC noted. IMPRESSION: Interval transmetatarsal amputation. Electronically Signed   By: Kathreen Devoid   On: 03/27/2020 10:57   DG  Foot 2 Views Right  Result Date: 03/25/2020 CLINICAL DATA:  Transmetatarsal amputation EXAM: RIGHT FOOT - 2 VIEW COMPARISON:  03/19/2020 FINDINGS: Previous amputation at the level of the MTP joint involving the first and third digits. Chronic dorsal displacement of the base of the second proximal phalanx with respect to the head of the metatarsal. Interval amputation of fifth digit at the level of the proximal shaft of the metatarsal. Vascular calcifications. IMPRESSION: 1. Interval amputation of fifth digit at the level of the proximal shaft of the metatarsal. 2. Previous amputation of the first and third digits. Chronic dislocation at the second MTP joint. Electronically Signed   By: Donavan Foil M.D.   On: 03/25/2020 21:26   VAS [place] ABI WITH/WO TBI  Result Date: 03/26/2020 LOWER EXTREMITY DOPPLER STUDY Indications: Gangrene, and peripheral artery disease.  Vascular Interventions: Pre-Op TMA 12/30 s/p toe amputations. Performing Technologist: Vonzell Schlatter RVT  Examination Guidelines: A complete evaluation includes at minimum, Doppler waveform signals and systolic blood pressure reading at the level of bilateral brachial, anterior tibial, and posterior tibial arteries, when vessel segments are accessible. Bilateral testing is considered an integral part of a complete examination. Photoelectric Plethysmograph (PPG) waveforms and toe systolic pressure readings are included as required and additional duplex testing as needed. Limited examinations for reoccurring indications may be performed as noted.  ABI Findings: +---------+------------------+-----+---------+---------------------+ Right    Rt Pressure (mmHg)IndexWaveform Comment               +---------+------------------+-----+---------+---------------------+ Brachial 125                    triphasic                      +---------+------------------+-----+---------+---------------------+ PTA      154               1.23 triphasic                       +---------+------------------+-----+---------+---------------------+ DP       164               1.31 triphasic                      +---------+------------------+-----+---------+---------------------+ Great Toe                                recent toes amputated +---------+------------------+-----+---------+---------------------+ +--------+------------------+-----+---------+---------------+ Left    Lt Pressure (mmHg)IndexWaveform Comment         +--------+------------------+-----+---------+---------------+ Brachial  triphasicIV and bandages +--------+------------------+-----+---------+---------------+ PTA     121               0.97 triphasic                +--------+------------------+-----+---------+---------------+ DP      140               1.12 triphasic                +--------+------------------+-----+---------+---------------+  Summary: Right: Resting right ankle-brachial index indicates noncompressible right lower extremity arteries. Left: Resting left ankle-brachial index is within normal range. No evidence of significant left lower extremity arterial disease.  *See table(s) above for measurements and observations.  Electronically signed by Jamelle Haring on 03/26/2020 at 5:30:30 PM.   Final        [facility] If 7PM-7AM, please contact night-coverage at [email or web address], Office  [phone]   03/27/2020, 2:46 PM  LOS: 2 days

## 2020-03-27 NOTE — Progress Notes (Signed)
CRITICAL VALUE ALERT  Critical Value: Hgb-7.6 from 8.2 03/26/2020  Date & Time Notied: 0500  Provider Notified: Ardith Dark NP  Orders Received/Actions taken: MD Aware/Defer to AP/will monitor

## 2020-03-27 NOTE — Transfer of Care (Signed)
Immediate Anesthesia Transfer of Care Note  Patient: [name]  Procedure(s) Performed: TRANSMETATARSAL AMPUTATION RIGHT FOOT, MEDIAL PLANTAR ARTERY FLAP, APPLICATION OF WOUND VAC  (Right Toe)  Patient Location: PACU  Anesthesia Type:MAC  Level of Consciousness: awake, alert  and oriented  Airway & Oxygen Therapy: Patient Spontanous Breathing and Patient connected to face mask  Post-op Assessment: Report given to RN and Post -op Vital signs reviewed and stable  Post vital signs: Reviewed and stable  Last Vitals:  Vitals Value Taken Time  BP    Temp    Pulse 74 03/27/20 0934  Resp 17 03/27/20 0934  SpO2 100 % 03/27/20 0934  Vitals shown include unvalidated device data.  Last Pain:  Vitals:   03/27/20 0710  TempSrc:   PainSc: 4       Patients Stated Pain Goal: 3 (22/97/98 9211)  Complications: No complications documented.

## 2020-03-28 ENCOUNTER — Inpatient Hospital Stay (HOSPITAL_COMMUNITY): Payer: Medicaid Other

## 2020-03-28 DIAGNOSIS — Z9889 Other specified postprocedural states: Secondary | ICD-10-CM | POA: Diagnosis not present

## 2020-03-28 DIAGNOSIS — E0843 Diabetes mellitus due to underlying condition with diabetic autonomic (poly)neuropathy: Secondary | ICD-10-CM | POA: Diagnosis not present

## 2020-03-28 DIAGNOSIS — M86171 Other acute osteomyelitis, right ankle and foot: Secondary | ICD-10-CM | POA: Diagnosis not present

## 2020-03-28 DIAGNOSIS — A480 Gas gangrene: Secondary | ICD-10-CM | POA: Diagnosis not present

## 2020-03-28 LAB — GLUCOSE, CAPILLARY
Glucose-Capillary: 75 mg/dL (ref 70–99)
Glucose-Capillary: 75 mg/dL (ref 70–99)
Glucose-Capillary: 90 mg/dL (ref 70–99)
Glucose-Capillary: 115 mg/dL — ABNORMAL HIGH (ref 70–99)
Glucose-Capillary: 104 mg/dL — ABNORMAL HIGH (ref 70–99)

## 2020-03-28 LAB — WOUND CULTURE
MICRO NUMBER:: 11362994
SPECIMEN QUALITY:: ADEQUATE

## 2020-03-28 LAB — CBC
HCT: 26.1 % — ABNORMAL LOW (ref 39.0–52.0)
Hemoglobin: 7.8 g/dL — ABNORMAL LOW (ref 13.0–17.0)
MCH: 27.4 pg (ref 26.0–34.0)
MCHC: 29.9 g/dL — ABNORMAL LOW (ref 30.0–36.0)
MCV: 91.6 fL (ref 80.0–100.0)
Platelets: 378 10*3/uL (ref 150–400)
RBC: 2.85 MIL/uL — ABNORMAL LOW (ref 4.22–5.81)
RDW: 14.9 % (ref 11.5–15.5)
WBC: 15.4 10*3/uL — ABNORMAL HIGH (ref 4.0–10.5)
nRBC: 0 % (ref 0.0–0.2)

## 2020-03-28 MED ORDER — DEXTROSE-NACL 5-0.45 % IV SOLN: INTRAVENOUS | Status: DC

## 2020-03-28 MED ORDER — VANCOMYCIN HCL IN DEXTROSE 1-5 GM/200ML-% IV SOLN
1000.0000 mg | INTRAVENOUS | Status: DC
  Administered 2020-03-29: 1000 mg via INTRAVENOUS
  Filled 2020-03-28 (×2): qty 200

## 2020-03-28 MED ORDER — PROCHLORPERAZINE EDISYLATE 10 MG/2ML IJ SOLN
10.0000 mg | Freq: Four times a day (QID) | INTRAMUSCULAR | Status: DC | PRN
  Administered 2020-03-28 – 2020-04-01 (×3): 10 mg via INTRAVENOUS
  Filled 2020-03-28 (×3): qty 2

## 2020-03-28 NOTE — Progress Notes (Signed)
Pt voiding 200 mL this shift. Bladder scanned performed showing 521mL. In and out catheterization performed returning 625 mL of clear, yellow urine. Pt tolerated procedure well. MD made aware. Will continue to monitor.

## 2020-03-28 NOTE — Progress Notes (Signed)
PT Cancellation Note  Patient Details Name: [name] MRN: [id number] DOB: January 14, 1971   Cancelled Treatment:     PT order received and eval attempted x 2 this am but deferred 2* ongoing nausea/vomiting and lethargy.  RN aware.   Will follow.   Shandria Clinch 03/28/2020, 1:15 PM

## 2020-03-28 NOTE — Progress Notes (Signed)
Pt sleepy minimally verbal.  The chaplain attempted several visits, silent prayers offered at bedside. Further visits will be offered.

## 2020-03-28 NOTE — Progress Notes (Signed)
[facility]  PROGRESS NOTE  [name] XBD:[id number] DOB: [birth year] DOA: 03/25/2020 PCP: Gildardo Pounds, NP   Brief HPI:   49 year old male with past medical history of diabetes mellitus type 2, prior history of amputation, hypertension, CKD stage III presented with worsening right foot wound ongoing for past 1 week.  He had foul-smelling discharge coming from the wound.  He was seen by podiatry.  He underwent incision and drainage of right foot gas gangrene with partial fifth ray resection.    Subjective   Patient seen and examined, has been having intermittent episodes of vomiting.   Assessment/Plan:     1. 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  Patient underwent incision and drainage for gas gangrene of right foot along with partial fifth ray resection.  Podiatry took patient for surgery, he underwent transmetatarsal amputation of right foot.discharge continue vancomycin.  Follow wound culture results. 2. Vomiting-we will obtain abdominal x-ray to rule out ileus versus SBO.  Will keep n.p.o., start D5 half-normal saline at 100 mill per hour.  Continue Zofran as needed for nausea vomiting vomiting 3. Diabetes mellitus type 2-continue sliding scale insulin with NovoLog.  CBG well controlled. 4. CKD stage III-creatinine is 2.89, little worse from yesterday.  Likely from vomiting.  Started on IV fluids.  Follow BMP in am. 5. Hypertension-continue Norvasc, Coreg, clonidine.  Blood pressure well controlled. 6. Neuropathy-continue gabapentin. 7. Tobacco abuse-nicotine patch     COVID-19 Labs  Recent Labs    03/25/20 2307  CRP 5.0*    Lab Results  Component Value Date   SARSCOV2NAA NEGATIVE 03/25/2020   SARSCOV2NAA NEGATIVE 03/14/2020   SARSCOV2NAA NEGATIVE 06/29/2019   [place] NEGATIVE 06/29/2019     Scheduled medications:   . (feeding supplement) PROSource Plus  30 mL Oral BID BM  . amLODipine  10 mg  Oral Daily  . aspirin EC  81 mg Oral Daily  . atorvastatin  20 mg Oral Daily  . B-complex with vitamin C  1 tablet Oral Daily  . buPROPion  150 mg Oral Daily  . carvedilol  25 mg Oral BID  . Chlorhexidine Gluconate Cloth  6 each Topical UD  . cloNIDine  0.1 mg Oral BID  . escitalopram  10 mg Oral QHS  . ferrous sulfate  325 mg Oral Daily  . gabapentin  300 mg Oral TID  . heparin  5,000 Units Subcutaneous Q8H  . insulin aspart  0-9 Units Subcutaneous Q4H  . insulin glargine  15 Units Subcutaneous BID  . metroNIDAZOLE  500 mg Oral Q8H  . nicotine  7 mg Transdermal Daily  . nutrition supplement (JUVEN)  1 packet Oral BID BM  . senna-docusate  1 tablet Oral BID         CBG: Recent Labs  Lab 03/27/20 1944 03/27/20 2330 03/28/20 0344 03/28/20 0744 03/28/20 1140  GLUCAP 147* 123* 75 75 90    SpO2: 100 % O2 Flow Rate (L/min): 2 L/min    CBC: Recent Labs  Lab 03/25/20 1500 03/26/20 0403 03/27/20 0426 03/28/20 0305  WBC 17.8* 16.6* 14.0* 15.4*  NEUTROABS 13.1*  --   --   --   HGB 9.4* 8.2* 7.6* 7.8*  HCT 30.1* 26.5* 24.7* 26.1*  MCV 87.0 88.6 88.5 91.6  PLT 428* 382 376 834    Basic Metabolic Panel: Recent Labs  Lab 03/25/20 1500 03/26/20 0403 03/27/20 0426  NA 138 137 136  K 3.9 4.4 3.9  CL 110 112*  110  CO2 19* 17* 19*  GLUCOSE 193* 160* 121*  BUN 29* 26* 28*  CREATININE 2.58* 2.67* 2.89*  CALCIUM 8.6* 8.4* 8.1*     Liver Function Tests: Recent Labs  Lab 03/25/20 1500 03/26/20 0403  AST 14* 13*  ALT 28 21  ALKPHOS 83 68  BILITOT 0.3 0.4  PROT 7.9 6.8  ALBUMIN 2.9* 2.6*     Antibiotics: Anti-infectives (From admission, onward)   Start     Dose/Rate Route Frequency Ordered Stop   03/29/20 1800  vancomycin (VANCOCIN) IVPB 1000 mg/200 mL premix        1,000 mg 200 mL/hr over 60 Minutes Intravenous Every 48 hours 03/28/20 1012     03/27/20 1500  vancomycin (VANCOREADY) IVPB 1250 mg/250 mL  Status:  Discontinued        1,250 mg 166.7  mL/hr over 90 Minutes Intravenous Every 48 hours 03/25/20 2020 03/28/20 1012   03/27/20 0815  vancomycin (VANCOCIN) powder  Status:  Discontinued          As needed 03/27/20 0815 03/27/20 1103   03/27/20 0600  clindamycin (CLEOCIN) IVPB 900 mg        900 mg 100 mL/hr over 30 Minutes Intravenous On call to O.R. 03/26/20 1633 03/27/20 0745   03/25/20 2200  metroNIDAZOLE (FLAGYL) tablet 500 mg        500 mg Oral Every 8 hours 03/25/20 1841     03/25/20 1845  cefTRIAXone (ROCEPHIN) 2 g in sodium chloride 0.9 % 100 mL IVPB        2 g 200 mL/hr over 30 Minutes Intravenous Every 24 hours 03/25/20 1841     03/25/20 1500  vancomycin (VANCOREADY) IVPB 1500 mg/300 mL        1,500 mg 150 mL/hr over 120 Minutes Intravenous  Once 03/25/20 1426 03/25/20 1744   03/25/20 1430  ceFEPIme (MAXIPIME) 2 g in sodium chloride 0.9 % 100 mL IVPB        2 g 200 mL/hr over 30 Minutes Intravenous  Once 03/25/20 1425 03/25/20 1510   03/25/20 1430  metroNIDAZOLE (FLAGYL) tablet 500 mg        500 mg Oral  Once 03/25/20 1425 03/25/20 1442       DVT prophylaxis: Heparin  Code Status: Full code  Family Communication: No family at bedside   Consultants:  Podiatry  Procedures:      Objective   Vitals:   03/28/20 0248 03/28/20 0555 03/28/20 1100 03/28/20 1327  BP: 131/65 132/80 111/68 125/72  Pulse: 78 73 74 72  Resp: 17 14 16 16   Temp: 99 F (37.2 C) 98.1 F (36.7 C) 98 F (36.7 C) 98.3 F (36.8 C)  TempSrc: Oral Oral Oral Oral  SpO2:  100% 99% 100%  Weight:      Height:        Intake/Output Summary (Last 24 hours) at 03/28/2020 1425 Last data filed at 03/28/2020 1039 Gross per 24 hour  Intake 1370 ml  Output 200 ml  Net 1170 ml    12/29 1901 - 12/31 0700 In: 3229.2 [P.O.:1320; I.V.:1559.2] Out: 260 [Urine:235]  Filed Weights   03/25/20 1916  Weight: 90.7 kg    Physical Examination:    General-appears in no acute distress  Heart-S1-S2, regular, no murmur  auscultated  Lungs-clear to auscultation bilaterally, no wheezing or crackles auscultated  Abdomen-soft, nontender, no organomegaly  Extremities- right foot in dressing  Neuro-alert, oriented x3, no focal deficit noted   Status is: Inpatient  Dispo: The patient is from: Home              Anticipated d/c is to: Home              Anticipated d/c date is: 04/01/2020              Patient currently not medically stable for discharge  Barrier to discharge-plan for transmetatarsal amputation in a.m.      Data Reviewed:   Recent Results (from the past 240 hour(s))  WOUND CULTURE     Status: Abnormal   Collection Time: 03/19/20 10:00 AM  Result Value Ref Range Status   MICRO NUMBER: [id number]  Final   SPECIMEN QUALITY: Adequate  Final   SOURCE: WOUND (SITE NOT SPECIFIED)  Final   STATUS: FINAL  Final   GRAM STAIN:   Final    Rare epithelial cells Rare Polymorphonuclear leukocytes Many Gram negative bacilli Many Gram positive cocci in clusters   ISOLATE 1: Proteus mirabilis (A)  Final    Comment: Heavy growth of Proteus mirabilis      Susceptibility   Proteus mirabilis - AEROBIC CULT, GRAM STAIN NEGATIVE 1    AMPICILLIN  Resistant     AMPICILLIN/SULBACTAM <=2 Sensitive     CEFAZOLIN* 8 Resistant      * For infections other than uncomplicated UTIcaused by E. coli, K. pneumoniae or P. mirabilis:Cefazolin is resistant if MIC > or = 8 mcg/mL.(Distinguishing susceptible versus intermediatefor isolates with MIC < or = 4 mcg/mL requiresadditional testing.)    CEFEPIME <=1 Sensitive     CEFTRIAXONE <=1 Sensitive     CIPROFLOXACIN <=0.25 Sensitive     LEVOFLOXACIN <=0.12 Sensitive     ERTAPENEM <=0.5 Sensitive     GENTAMICIN <=1 Sensitive     PIP/TAZO <=4 Sensitive     TOBRAMYCIN <=1 Sensitive     TRIMETH/SULFA* <=20 Sensitive      * For infections other than uncomplicated UTIcaused by E. coli, K. pneumoniae or P. mirabilis:Cefazolin is resistant if MIC > or = 8 mcg/mL.(Distinguishing  susceptible versus intermediatefor isolates with MIC < or = 4 mcg/mL requiresadditional testing.)Legend:S = Susceptible  I = IntermediateR = Resistant  NS = Not susceptible* = Not tested  NR = Not reported**NN = See antimicrobic comments  WOUND CULTURE     Status: None   Collection Time: 03/25/20 11:00 AM   Specimen: Foot; Wound  Result Value Ref Range Status   MICRO NUMBER: [id number]  Final   SPECIMEN QUALITY: Adequate  Final   SOURCE: ULCER RIGHT FOOT  Final   STATUS: FINAL  Final   GRAM STAIN:   Final    Many White blood cells seen Rare epithelial cells Many Gram negative bacilli Many Gram positive cocci in clusters   RESULT:   Final    Growth of skin flora (note: Growth does not include S. aureus, beta-hemolytic Streptococci or P. aeruginosa).  Resp Panel by RT-PCR (Flu A&B, Covid) Nasopharyngeal Swab     Status: None   Collection Time: 03/25/20  2:24 PM   Specimen: Nasopharyngeal Swab; Nasopharyngeal(NP) swabs in vial transport medium  Result Value Ref Range Status   SARS Coronavirus 2 by RT PCR NEGATIVE NEGATIVE Final    Comment: (NOTE) SARS-CoV-2 target nucleic acids are NOT DETECTED.  The SARS-CoV-2 RNA is generally detectable in upper respiratory specimens during the acute phase of infection. The lowest concentration of SARS-CoV-2 viral copies this assay can detect is 138 copies/mL. A negative result does not preclude  SARS-Cov-2 infection and should not be used as the sole basis for treatment or other patient management decisions. A negative result may occur with  improper specimen collection/handling, submission of specimen other than nasopharyngeal swab, presence of viral mutation(s) within the areas targeted by this assay, and inadequate number of viral copies(<138 copies/mL). A negative result must be combined with clinical observations, patient history, and epidemiological information. The expected result is Negative.  Fact Sheet for Patients:   EntrepreneurPulse.com.au  Fact Sheet for Healthcare Providers:  IncredibleEmployment.be  This test is no t yet approved or cleared by the [place] FDA and  has been authorized for detection and/or diagnosis of SARS-CoV-2 by FDA under an Emergency Use Authorization (EUA). This EUA will remain  in effect (meaning this test can be used) for the duration of the COVID-19 declaration under Section 564(b)(1) of the Act, 21 U.S.C.section 360bbb-3(b)(1), unless the authorization is terminated  or revoked sooner.       Influenza A by PCR NEGATIVE NEGATIVE Final   Influenza B by PCR NEGATIVE NEGATIVE Final    Comment: (NOTE) The Xpert Xpress SARS-CoV-2/FLU/RSV plus assay is intended as an aid in the diagnosis of influenza from Nasopharyngeal swab specimens and should not be used as a sole basis for treatment. Nasal washings and aspirates are unacceptable for Xpert Xpress SARS-CoV-2/FLU/RSV testing.  Fact Sheet for Patients: EntrepreneurPulse.com.au  Fact Sheet for Healthcare Providers: IncredibleEmployment.be  This test is not yet approved or cleared by the [place] FDA and has been authorized for detection and/or diagnosis of SARS-CoV-2 by FDA under an Emergency Use Authorization (EUA). This EUA will remain in effect (meaning this test can be used) for the duration of the COVID-19 declaration under Section 564(b)(1) of the Act, 21 U.S.C. section 360bbb-3(b)(1), unless the authorization is terminated or revoked.  Performed at [facility], [place] [address] [place], [place] [address]   Culture, blood (routine x 2)     Status: None (Preliminary result)   Collection Time: 03/25/20  3:00 PM   Specimen: BLOOD  Result Value Ref Range Status   Specimen Description   Final    BLOOD LEFT ANTECUBITAL Performed at [place] [address] [place], [place] [address]     Special Requests   Final    BOTTLES DRAWN AEROBIC AND ANAEROBIC Blood Culture adequate volume Performed at [place] [address], [place], [place] [address]    Culture   Final    NO GROWTH 3 DAYS Performed at [facility], [place] [address] [place], [place] [address]    Report Status PENDING  Incomplete  Culture, blood (routine x 2)     Status: None (Preliminary result)   Collection Time: 03/25/20  3:00 PM   Specimen: BLOOD LEFT HAND  Result Value Ref Range Status   Specimen Description   Final    BLOOD LEFT HAND Performed at [place] [address], [place], [place] [address]    Special Requests   Final    BOTTLES DRAWN AEROBIC AND ANAEROBIC Blood Culture adequate volume Performed at [place] [address], [place], [place] [address]    Culture   Final    NO GROWTH 3 DAYS Performed at [facility], [place] [address] [place], [place] [address]    Report Status PENDING  Incomplete  Aerobic/Anaerobic Culture (surgical/deep wound)     Status: None (Preliminary result)   Collection Time: 03/25/20  8:12  PM   Specimen: Wound; Tissue  Result Value Ref Range Status   Specimen Description   Final    WOUND Performed at [facility] [address], [place], [place] [address]    Special Requests   Final    NONE Performed at [name], [place] [address] [place], [place] [address]    Gram Stain   Final    NO WBC SEEN NO ORGANISMS SEEN Performed at [facility], [place] [address] [place], [place] [address]    Culture   Final    CULTURE REINCUBATED FOR BETTER GROWTH NO ANAEROBES ISOLATED; CULTURE IN PROGRESS FOR 5 DAYS    Report Status PENDING  Incomplete  Aerobic/Anaerobic Culture (surgical/deep wound)     Status: None (Preliminary result)   Collection Time: 03/27/20  8:30 AM   Specimen: Foot, Right; Wound  Result Value Ref Range Status   Specimen  Description   Final    FOOT RIGHT 5TH METATARSAL Performed at [facility] [address], [place], [place] [address]    Special Requests   Final    NONE Performed at [facility], [place] [address] [place], [place] [address]    Gram Stain NO WBC SEEN NO ORGANISMS SEEN   Final   Culture   Final    NO GROWTH < 24 HOURS Performed at [facility], [place] [address] [place], [place] [address]    Report Status PENDING  Incomplete    No results for input(s): LIPASE, AMYLASE in the last 168 hours. No results for input(s): AMMONIA in the last 168 hours.  Cardiac Enzymes: No results for input(s): CKTOTAL, CKMB, CKMBINDEX, TROPONINI in the last 168 hours. BNP (last 3 results) No results for input(s): BNP in the last 8760 hours.  ProBNP (last 3 results) No results for input(s): PROBNP in the last 8760 hours.  Studies:  DG Foot 2 Views Right  Result Date: 03/27/2020 CLINICAL DATA:  Postop transmetatarsal amputation EXAM: RIGHT FOOT - 2 VIEW COMPARISON:  None. FINDINGS: Interval transmetatarsal amputation. No bone destruction. No periosteal reaction. No fracture or dislocation. Postsurgical changes in the overlying soft tissues. Small plantar calcaneal spur. Lateral wound VAC noted. IMPRESSION: Interval transmetatarsal amputation. Electronically Signed   By: Kathreen Devoid   On: 03/27/2020 10:57   VAS [place] ABI WITH/WO TBI  Result Date: 03/26/2020 LOWER EXTREMITY DOPPLER STUDY Indications: Gangrene, and peripheral artery disease.  Vascular Interventions: Pre-Op TMA 12/30 s/p toe amputations. Performing Technologist: Vonzell Schlatter RVT  Examination Guidelines: A complete evaluation includes at minimum, Doppler waveform signals and systolic blood pressure reading at the level of bilateral brachial, anterior tibial, and posterior tibial arteries, when vessel segments are accessible. Bilateral testing is considered an integral part of a complete examination.  Photoelectric Plethysmograph (PPG) waveforms and toe systolic pressure readings are included as required and additional duplex testing as needed. Limited examinations for reoccurring indications may be performed as noted.  ABI Findings: +---------+------------------+-----+---------+---------------------+ Right    Rt Pressure (mmHg)IndexWaveform Comment               +---------+------------------+-----+---------+---------------------+ Brachial 125                    triphasic                      +---------+------------------+-----+---------+---------------------+ PTA      154  1.23 triphasic                      +---------+------------------+-----+---------+---------------------+ DP       164               1.31 triphasic                      +---------+------------------+-----+---------+---------------------+ Great Toe                                recent toes amputated +---------+------------------+-----+---------+---------------------+ +--------+------------------+-----+---------+---------------+ Left    Lt Pressure (mmHg)IndexWaveform Comment         +--------+------------------+-----+---------+---------------+ Brachial                       triphasicIV and bandages +--------+------------------+-----+---------+---------------+ PTA     121               0.97 triphasic                +--------+------------------+-----+---------+---------------+ DP      140               1.12 triphasic                +--------+------------------+-----+---------+---------------+  Summary: Right: Resting right ankle-brachial index indicates noncompressible right lower extremity arteries. Left: Resting left ankle-brachial index is within normal range. No evidence of significant left lower extremity arterial disease.  *See table(s) above for measurements and observations.  Electronically signed by Jamelle Haring on 03/26/2020 at 5:30:30 PM.   Final        [facility] If 7PM-7AM, please contact night-coverage at [email or web address], Office  [phone]   03/28/2020, 2:25 PM  LOS: 3 days

## 2020-03-28 NOTE — Progress Notes (Signed)
Patient has had three episodes of emesis, each time small amounts. Zofran given at 0130. Paged M. Sharlet Salina NP to request additional nausea medication / orders. VSS. Will continue to monitor.

## 2020-03-28 NOTE — Progress Notes (Signed)
Pharmacy Antibiotic Note  [name] is a 49 y.o. male admitted on 03/25/2020 with diabetic foot infection.  Pharmacy has been consulted for Vancomycin dosing.  SCr up to 2.89. Discussed with MD, will continue current regimen and await cx results for now. New cx drawn on 12/30 in the OR.  Plan: Decrease vancomycin to 1g IV Q48h Ceftriaxone + Flagyl per MD Monitor renal function F/U cx results, LOT   Height: 5\' 4"  (162.6 cm) Weight: 90.7 kg (200 lb) IBW/kg (Calculated) : 59.2  Temp (24hrs), Avg:98.1 F (36.7 C), Min:97.5 F (36.4 C), Max:99 F (37.2 C)  Recent Labs  Lab 03/25/20 1500 03/26/20 0403 03/27/20 0426 03/28/20 0305  WBC 17.8* 16.6* 14.0* 15.4*  CREATININE 2.58* 2.67* 2.89*  --   LATICACIDVEN 1.3  --   --   --     Estimated Creatinine Clearance: 31.1 mL/min (A) (by C-G formula based on SCr of 2.89 mg/dL (H)).    Allergies  Allergen Reactions  . Bee Venom Swelling    SWELLING REACTION UNSPECIFIED   . Penicillins Hives and Other (See Comments)    PATIENT HAS HAD A PCN REACTION WITH IMMEDIATE RASH, FACIAL/TONGUE/THROAT SWELLING, SOB, OR LIGHTHEADEDNESS WITH HYPOTENSION:  **Can tolerate cephalosporins**  . Clonidine Derivatives     Pt states "It is making me dizzy"    Thank you for allowing pharmacy to be a part of this patient's care.  Elenor Quinones, PharmD, BCPS, BCIDP Clinical Pharmacist 03/28/2020 8:20 AM

## 2020-03-29 DIAGNOSIS — M86171 Other acute osteomyelitis, right ankle and foot: Secondary | ICD-10-CM | POA: Diagnosis not present

## 2020-03-29 DIAGNOSIS — A480 Gas gangrene: Secondary | ICD-10-CM | POA: Diagnosis not present

## 2020-03-29 DIAGNOSIS — Z9889 Other specified postprocedural states: Secondary | ICD-10-CM | POA: Diagnosis not present

## 2020-03-29 DIAGNOSIS — E0843 Diabetes mellitus due to underlying condition with diabetic autonomic (poly)neuropathy: Secondary | ICD-10-CM | POA: Diagnosis not present

## 2020-03-29 LAB — CBC
HCT: 27.4 % — ABNORMAL LOW (ref 39.0–52.0)
Hemoglobin: 8.4 g/dL — ABNORMAL LOW (ref 13.0–17.0)
MCH: 27.5 pg (ref 26.0–34.0)
MCHC: 30.7 g/dL (ref 30.0–36.0)
MCV: 89.5 fL (ref 80.0–100.0)
Platelets: 423 10*3/uL — ABNORMAL HIGH (ref 150–400)
RBC: 3.06 MIL/uL — ABNORMAL LOW (ref 4.22–5.81)
RDW: 15.2 % (ref 11.5–15.5)
WBC: 23.6 10*3/uL — ABNORMAL HIGH (ref 4.0–10.5)
nRBC: 0 % (ref 0.0–0.2)

## 2020-03-29 LAB — GLUCOSE, CAPILLARY
Glucose-Capillary: 107 mg/dL — ABNORMAL HIGH (ref 70–99)
Glucose-Capillary: 109 mg/dL — ABNORMAL HIGH (ref 70–99)
Glucose-Capillary: 113 mg/dL — ABNORMAL HIGH (ref 70–99)
Glucose-Capillary: 118 mg/dL — ABNORMAL HIGH (ref 70–99)
Glucose-Capillary: 132 mg/dL — ABNORMAL HIGH (ref 70–99)
Glucose-Capillary: 133 mg/dL — ABNORMAL HIGH (ref 70–99)
Glucose-Capillary: 68 mg/dL — ABNORMAL LOW (ref 70–99)

## 2020-03-29 LAB — BASIC METABOLIC PANEL
Anion gap: 8 (ref 5–15)
BUN: 36 mg/dL — ABNORMAL HIGH (ref 6–20)
CO2: 17 mmol/L — ABNORMAL LOW (ref 22–32)
Calcium: 8.2 mg/dL — ABNORMAL LOW (ref 8.9–10.3)
Chloride: 112 mmol/L — ABNORMAL HIGH (ref 98–111)
Creatinine, Ser: 2.79 mg/dL — ABNORMAL HIGH (ref 0.61–1.24)
GFR, Estimated: 27 mL/min — ABNORMAL LOW (ref >60)
Glucose, Bld: 113 mg/dL — ABNORMAL HIGH (ref 70–99)
Potassium: 3.7 mmol/L (ref 3.5–5.1)
Sodium: 137 mmol/L (ref 135–145)

## 2020-03-29 MED ORDER — DEXTROSE 50 % IV SOLN
12.5000 g | INTRAVENOUS | Status: AC
  Administered 2020-03-29: 12.5 g via INTRAVENOUS

## 2020-03-29 MED ORDER — MELATONIN 5 MG PO TABS
5.0000 mg | ORAL_TABLET | Freq: Every evening | ORAL | Status: DC | PRN
Start: 2020-03-29 — End: 2020-04-09
  Administered 2020-03-29 – 2020-04-06 (×3): 5 mg via ORAL
  Filled 2020-03-29 (×3): qty 1

## 2020-03-29 MED ORDER — DEXTROSE 50 % IV SOLN
INTRAVENOUS | Status: AC
  Filled 2020-03-29: qty 50

## 2020-03-29 NOTE — Progress Notes (Signed)
CBG rechecked and was 107.

## 2020-03-29 NOTE — Progress Notes (Signed)
[facility]  PROGRESS NOTE  [name] GBE:[id number] DOB: Dec 29, 1970 DOA: 03/25/2020 PCP: Gildardo Pounds, NP   Brief HPI:   50 year old male with past medical history of diabetes mellitus type 2, prior history of amputation, hypertension, CKD stage III presented with worsening right foot wound ongoing for past 1 week.  He had foul-smelling discharge coming from the wound.  He was seen by podiatry.  He underwent incision and drainage of right foot gas gangrene with partial fifth ray resection.    Subjective   Patient seen and examined, developed vomiting yesterday had 2 loose stools this morning.  Abdominal x-ray obtained shows possible gastroenteritis.  Denies vomiting this morning.  Wants to try liquid diet.   Assessment/Plan:     1. 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  Patient underwent incision and drainage for gas gangrene of right foot along with partial fifth ray resection.  Podiatry took patient for surgery, he underwent transmetatarsal amputation of right foot.discharge continue vancomycin.  Follow wound culture results.  If cultures remain negative he can be discharged on p.o. antibiotics as per podiatry.-Heel WB in boot RLE 2. Gastroenteritis-patient developed vomiting, abdominal x-ray showed changes consistent with gastroenteritis.  Vomiting has resolved.  Will start clear liquid diet.  Continue Zofran 4 nausea and vomiting.  Continue D5 half-normal saline at 100 mL/h.   3. Diabetes mellitus type 2-continue sliding scale insulin with NovoLog.  CBG well controlled. 4. CKD stage III-creatinine is 2.79, almost at baseline.    Follow BMP in am. 5. Hypertension-continue Norvasc, Coreg, clonidine.  Blood pressure well controlled. 6. Neuropathy-continue gabapentin. 7. Tobacco abuse-nicotine patch     COVID-19 Labs  No results for input(s): DDIMER, FERRITIN, LDH, CRP in the last 72 hours.  Lab Results  Component  Value Date   SARSCOV2NAA NEGATIVE 03/25/2020   SARSCOV2NAA NEGATIVE 03/14/2020   SARSCOV2NAA NEGATIVE 06/29/2019   [place] NEGATIVE 06/29/2019     Scheduled medications:   . (feeding supplement) PROSource Plus  30 mL Oral BID BM  . amLODipine  10 mg Oral Daily  . aspirin EC  81 mg Oral Daily  . atorvastatin  20 mg Oral Daily  . B-complex with vitamin C  1 tablet Oral Daily  . buPROPion  150 mg Oral Daily  . carvedilol  25 mg Oral BID  . Chlorhexidine Gluconate Cloth  6 each Topical UD  . cloNIDine  0.1 mg Oral BID  . escitalopram  10 mg Oral QHS  . ferrous sulfate  325 mg Oral Daily  . gabapentin  300 mg Oral TID  . heparin  5,000 Units Subcutaneous Q8H  . insulin aspart  0-9 Units Subcutaneous Q4H  . insulin glargine  15 Units Subcutaneous BID  . metroNIDAZOLE  500 mg Oral Q8H  . nicotine  7 mg Transdermal Daily  . nutrition supplement (JUVEN)  1 packet Oral BID BM         CBG: Recent Labs  Lab 03/29/20 0031 03/29/20 0146 03/29/20 0424 03/29/20 0806 03/29/20 1110  GLUCAP 68* 107* 109* 113* 118*    SpO2: 100 % O2 Flow Rate (L/min): 2 L/min    CBC: Recent Labs  Lab 03/25/20 1500 03/26/20 0403 03/27/20 0426 03/28/20 0305 03/29/20 0443  WBC 17.8* 16.6* 14.0* 15.4* 23.6*  NEUTROABS 13.1*  --   --   --   --   HGB 9.4* 8.2* 7.6* 7.8* 8.4*  HCT 30.1* 26.5* 24.7* 26.1* 27.4*  MCV 87.0 88.6 88.5 91.6  89.5  PLT 428* 382 376 378 423*    Basic Metabolic Panel: Recent Labs  Lab 03/25/20 1500 03/26/20 0403 03/27/20 0426 03/29/20 0443  NA 138 137 136 137  K 3.9 4.4 3.9 3.7  CL 110 112* 110 112*  CO2 19* 17* 19* 17*  GLUCOSE 193* 160* 121* 113*  BUN 29* 26* 28* 36*  CREATININE 2.58* 2.67* 2.89* 2.79*  CALCIUM 8.6* 8.4* 8.1* 8.2*     Liver Function Tests: Recent Labs  Lab 03/25/20 1500 03/26/20 0403  AST 14* 13*  ALT 28 21  ALKPHOS 83 68  BILITOT 0.3 0.4  PROT 7.9 6.8  ALBUMIN 2.9* 2.6*     Antibiotics: Anti-infectives (From  admission, onward)   Start     Dose/Rate Route Frequency Ordered Stop   03/29/20 1800  vancomycin (VANCOCIN) IVPB 1000 mg/200 mL premix        1,000 mg 200 mL/hr over 60 Minutes Intravenous Every 48 hours 03/28/20 1012     03/27/20 1500  vancomycin (VANCOREADY) IVPB 1250 mg/250 mL  Status:  Discontinued        1,250 mg 166.7 mL/hr over 90 Minutes Intravenous Every 48 hours 03/25/20 2020 03/28/20 1012   03/27/20 0815  vancomycin (VANCOCIN) powder  Status:  Discontinued          As needed 03/27/20 0815 03/27/20 1103   03/27/20 0600  clindamycin (CLEOCIN) IVPB 900 mg        900 mg 100 mL/hr over 30 Minutes Intravenous On call to O.R. 03/26/20 1633 03/27/20 0745   03/25/20 2200  metroNIDAZOLE (FLAGYL) tablet 500 mg        500 mg Oral Every 8 hours 03/25/20 1841     03/25/20 1845  cefTRIAXone (ROCEPHIN) 2 g in sodium chloride 0.9 % 100 mL IVPB        2 g 200 mL/hr over 30 Minutes Intravenous Every 24 hours 03/25/20 1841     03/25/20 1500  vancomycin (VANCOREADY) IVPB 1500 mg/300 mL        1,500 mg 150 mL/hr over 120 Minutes Intravenous  Once 03/25/20 1426 03/25/20 1744   03/25/20 1430  ceFEPIme (MAXIPIME) 2 g in sodium chloride 0.9 % 100 mL IVPB        2 g 200 mL/hr over 30 Minutes Intravenous  Once 03/25/20 1425 03/25/20 1510   03/25/20 1430  metroNIDAZOLE (FLAGYL) tablet 500 mg        500 mg Oral  Once 03/25/20 1425 03/25/20 1442       DVT prophylaxis: Heparin  Code Status: Full code  Family Communication: No family at bedside   Consultants:  Podiatry  Procedures:      Objective   Vitals:   03/28/20 1327 03/28/20 1948 03/29/20 0431 03/29/20 1344  BP: 125/72 (!) 142/78 (!) 153/83 115/71  Pulse: 72 78 87 76  Resp: 16 18 20 18   Temp: 98.3 F (36.8 C) 98.2 F (36.8 C) 99.1 F (37.3 C) 98.4 F (36.9 C)  TempSrc: Oral Oral Oral Oral  SpO2: 100% 100% 100% 100%  Weight:      Height:        Intake/Output Summary (Last 24 hours) at 03/29/2020 1517 Last data filed at  03/29/2020 1513 Gross per 24 hour  Intake 3270 ml  Output 625 ml  Net 2645 ml    12/30 1901 - 01/01 0700 In: 1763.3 [P.O.:540; I.V.:873.3] Out: 825 [Urine:825]  Filed Weights   03/25/20 1916  Weight: 90.7 kg  Physical Examination:   General-appears in no acute distress Heart-S1-S2, regular, no murmur auscultated Lungs-clear to auscultation bilaterally, no wheezing or crackles auscultated Abdomen-soft, nontender, no organomegaly Extremities-right foot in dressing Neuro-alert, oriented x3, no focal deficit noted  Status is: Inpatient  Dispo: The patient is from: Home              Anticipated d/c is to: Home              Anticipated d/c date is: 03/31/2020              Patient currently not medically stable for discharge  Barrier to discharge-on empiric antibiotics after transmetatarsal amputation, podiatry following      Data Reviewed:   Recent Results (from the past 240 hour(s))  WOUND CULTURE     Status: None   Collection Time: 03/25/20 11:00 AM   Specimen: Foot; Wound  Result Value Ref Range Status   MICRO NUMBER: [id number]  Final   SPECIMEN QUALITY: Adequate  Final   SOURCE: ULCER RIGHT FOOT  Final   STATUS: FINAL  Final   GRAM STAIN:   Final    Many White blood cells seen Rare epithelial cells Many Gram negative bacilli Many Gram positive cocci in clusters   RESULT:   Final    Growth of skin flora (note: Growth does not include S. aureus, beta-hemolytic Streptococci or P. aeruginosa).  Resp Panel by RT-PCR (Flu A&B, Covid) Nasopharyngeal Swab     Status: None   Collection Time: 03/25/20  2:24 PM   Specimen: Nasopharyngeal Swab; Nasopharyngeal(NP) swabs in vial transport medium  Result Value Ref Range Status   SARS Coronavirus 2 by RT PCR NEGATIVE NEGATIVE Final    Comment: (NOTE) SARS-CoV-2 target nucleic acids are NOT DETECTED.  The SARS-CoV-2 RNA is generally detectable in upper respiratory specimens during the acute phase of infection. The  lowest concentration of SARS-CoV-2 viral copies this assay can detect is 138 copies/mL. A negative result does not preclude SARS-Cov-2 infection and should not be used as the sole basis for treatment or other patient management decisions. A negative result may occur with  improper specimen collection/handling, submission of specimen other than nasopharyngeal swab, presence of viral mutation(s) within the areas targeted by this assay, and inadequate number of viral copies(<138 copies/mL). A negative result must be combined with clinical observations, patient history, and epidemiological information. The expected result is Negative.  Fact Sheet for Patients:  EntrepreneurPulse.com.au  Fact Sheet for Healthcare Providers:  IncredibleEmployment.be  This test is no t yet approved or cleared by the [place] FDA and  has been authorized for detection and/or diagnosis of SARS-CoV-2 by FDA under an Emergency Use Authorization (EUA). This EUA will remain  in effect (meaning this test can be used) for the duration of the COVID-19 declaration under Section 564(b)(1) of the Act, 21 U.S.C.section 360bbb-3(b)(1), unless the authorization is terminated  or revoked sooner.       Influenza A by PCR NEGATIVE NEGATIVE Final   Influenza B by PCR NEGATIVE NEGATIVE Final    Comment: (NOTE) The Xpert Xpress SARS-CoV-2/FLU/RSV plus assay is intended as an aid in the diagnosis of influenza from Nasopharyngeal swab specimens and should not be used as a sole basis for treatment. Nasal washings and aspirates are unacceptable for Xpert Xpress SARS-CoV-2/FLU/RSV testing.  Fact Sheet for Patients: EntrepreneurPulse.com.au  Fact Sheet for Healthcare Providers: IncredibleEmployment.be  This test is not yet approved or cleared by the [place] FDA and has been  authorized for detection and/or diagnosis of SARS-CoV-2 by FDA under  an Emergency Use Authorization (EUA). This EUA will remain in effect (meaning this test can be used) for the duration of the COVID-19 declaration under Section 564(b)(1) of the Act, 21 U.S.C. section 360bbb-3(b)(1), unless the authorization is terminated or revoked.  Performed at [facility], [place] [address], [place], [place] [address]   Culture, blood (routine x 2)     Status: None (Preliminary result)   Collection Time: 03/25/20  3:00 PM   Specimen: BLOOD  Result Value Ref Range Status   Specimen Description   Final    BLOOD LEFT ANTECUBITAL Performed at [facility] [place] [address] [place], [place] [address]    Special Requests   Final    BOTTLES DRAWN AEROBIC AND ANAEROBIC Blood Culture adequate volume Performed at [facility] [address] [place], [place] [address]    Culture   Final    NO GROWTH 4 DAYS Performed at [facility], [place] [address] [place], [place] [address]    Report Status PENDING  Incomplete  Culture, blood (routine x 2)     Status: None (Preliminary result)   Collection Time: 03/25/20  3:00 PM   Specimen: BLOOD LEFT HAND  Result Value Ref Range Status   Specimen Description   Final    BLOOD LEFT HAND Performed at [facility] [address], [place], [place] [address]    Special Requests   Final    BOTTLES DRAWN AEROBIC AND ANAEROBIC Blood Culture adequate volume Performed at [facility] [address] [place], [place] [address]    Culture   Final    NO GROWTH 4 DAYS Performed at [facility], [place] [address], [place], [place] [address]    Report Status PENDING  Incomplete  Aerobic/Anaerobic Culture (surgical/deep wound)     Status: None (Preliminary result)   Collection Time: 03/25/20  8:12 PM   Specimen: Wound; Tissue  Result Value Ref Range Status   Specimen Description   Final    WOUND Performed at [facility] [address], [place], [place] [address]    Special Requests   Final    NONE Performed at [facility], [place] [address] [place], [place] [address]    Gram Stain   Final    NO WBC SEEN NO ORGANISMS SEEN Performed at [facility], [place] [address], [place], [place] [address]    Culture   Final    RARE STAPHYLOCOCCUS LUGDUNENSIS NO ANAEROBES ISOLATED; CULTURE IN PROGRESS FOR 5 DAYS    Report Status PENDING  Incomplete  Aerobic/Anaerobic Culture (surgical/deep wound)     Status: None (Preliminary result)   Collection Time: 03/27/20  8:30 AM   Specimen: Foot, Right; Wound  Result Value Ref Range Status   Specimen Description   Final    FOOT RIGHT 5TH METATARSAL Performed at [facility] [address], [place], [place] [address]    Special Requests   Final    NONE Performed at [facility], [place] [address] [place], [place] [address]    Gram Stain NO WBC SEEN NO ORGANISMS SEEN   Final   Culture   Final    NO GROWTH 2 DAYS Performed at [facility], [place] [address], [place], [place] [address]    Report Status PENDING  Incomplete    No  results for input(s): LIPASE, AMYLASE in the last 168 hours. No results for input(s): AMMONIA in the last 168 hours.  Cardiac Enzymes: No results for input(s): CKTOTAL, CKMB, CKMBINDEX, TROPONINI in the last 168 hours. BNP (last 3 results) No results for input(s): BNP in the last 8760 hours.  ProBNP (last 3 results) No results for input(s): PROBNP in the last 8760 hours.  Studies:  DG Abd 2 Views  Result Date: 03/28/2020 CLINICAL DATA:  RIGHT foot wound. EXAM: ABDOMEN - 2 VIEW COMPARISON:  None. FINDINGS: Scattered nonspecific air-fluid levels within the small and large bowel. No dilated large or small bowel loops. No evidence of free intraperitoneal air is seen. No acute appearing osseous abnormality. IMPRESSION: 1. Scattered nonspecific air-fluid levels  throughout the abdomen, involving both large and small bowel, suggesting gastroenteritis or enterocolitis of infectious or inflammatory nature. 2. No evidence of bowel obstruction. Electronically Signed   By: Franki Cabot M.D.   On: 03/28/2020 15:40       [place]   [facility] If 7PM-7AM, please contact night-coverage at [email or web address], Office  [phone]   03/29/2020, 3:17 PM  LOS: 4 days

## 2020-03-29 NOTE — Progress Notes (Signed)
Subjective:  Patient ID: [name], male    DOB: 1970-06-28,  MRN: [id number]  Patient seen bedside. Denies new complaints today. Foot is hurting but controlled with medications. Eager to go home.  Objective:   Vitals:   03/28/20 1948 03/29/20 0431  BP: (!) 142/78 (!) 153/83  Pulse: 78 87  Resp: 18 20  Temp: 98.2 F (36.8 C) 99.1 F (37.3 C)  SpO2: 100% 100%   General AA&O x3. Normal mood and affect.  Vascular Foot warm and well perfused.  Neurologic Epicritic sensation grossly diminished.  Dermatologic Wound well-appearing. Skin edges soft and viable. Lateral wound granulating in. No residual warmth/erythema. Continued edema. <50 ccs in cannister.  Orthopedic: + motor to foot   Results for orders placed or performed during the hospital encounter of 03/25/20  Resp Panel by RT-PCR (Flu A&B, Covid) Nasopharyngeal Swab     Status: None   Collection Time: 03/25/20  2:24 PM   Specimen: Nasopharyngeal Swab; Nasopharyngeal(NP) swabs in vial transport medium  Result Value Ref Range Status   SARS Coronavirus 2 by RT PCR NEGATIVE NEGATIVE Final    Comment: (NOTE) SARS-CoV-2 target nucleic acids are NOT DETECTED.  The SARS-CoV-2 RNA is generally detectable in upper respiratory specimens during the acute phase of infection. The lowest concentration of SARS-CoV-2 viral copies this assay can detect is 138 copies/mL. A negative result does not preclude SARS-Cov-2 infection and should not be used as the sole basis for treatment or other patient management decisions. A negative result may occur with  improper specimen collection/handling, submission of specimen other than nasopharyngeal swab, presence of viral mutation(s) within the areas targeted by this assay, and inadequate number of viral copies(<138 copies/mL). A negative result must be combined with clinical observations, patient history, and epidemiological information. The expected result is Negative.  Fact Sheet for  Patients:  EntrepreneurPulse.com.au  Fact Sheet for Healthcare Providers:  IncredibleEmployment.be  This test is no t yet approved or cleared by the [place] FDA and  has been authorized for detection and/or diagnosis of SARS-CoV-2 by FDA under an Emergency Use Authorization (EUA). This EUA will remain  in effect (meaning this test can be used) for the duration of the COVID-19 declaration under Section 564(b)(1) of the Act, 21 U.S.C.section 360bbb-3(b)(1), unless the authorization is terminated  or revoked sooner.       Influenza A by PCR NEGATIVE NEGATIVE Final   Influenza B by PCR NEGATIVE NEGATIVE Final    Comment: (NOTE) The Xpert Xpress SARS-CoV-2/FLU/RSV plus assay is intended as an aid in the diagnosis of influenza from Nasopharyngeal swab specimens and should not be used as a sole basis for treatment. Nasal washings and aspirates are unacceptable for Xpert Xpress SARS-CoV-2/FLU/RSV testing.  Fact Sheet for Patients: EntrepreneurPulse.com.au  Fact Sheet for Healthcare Providers: IncredibleEmployment.be  This test is not yet approved or cleared by the [place] FDA and has been authorized for detection and/or diagnosis of SARS-CoV-2 by FDA under an Emergency Use Authorization (EUA). This EUA will remain in effect (meaning this test can be used) for the duration of the COVID-19 declaration under Section 564(b)(1) of the Act, 21 U.S.C. section 360bbb-3(b)(1), unless the authorization is terminated or revoked.  Performed at [facility], [place] [address] [place],  [address]   Culture, blood (routine x 2)     Status: None (Preliminary result)   Collection Time: 03/25/20  3:00 PM   Specimen: BLOOD  Result Value Ref Range Status   Specimen Description  Final    BLOOD LEFT ANTECUBITAL Performed at [facility] [address] [place], [place]  [address]    Special Requests   Final    BOTTLES DRAWN AEROBIC AND ANAEROBIC Blood Culture adequate volume Performed at [facility] [address], [place], [place] [address]    Culture   Final    NO GROWTH 4 DAYS Performed at [facility], [place] [address] [place], [place] [address]    Report Status PENDING  Incomplete  Culture, blood (routine x 2)     Status: None (Preliminary result)   Collection Time: 03/25/20  3:00 PM   Specimen: BLOOD LEFT HAND  Result Value Ref Range Status   Specimen Description   Final    BLOOD LEFT HAND Performed at [facility] [address] [place], [place] [address]    Special Requests   Final    BOTTLES DRAWN AEROBIC AND ANAEROBIC Blood Culture adequate volume Performed at [facility] [address], [place], [place] [address]    Culture   Final    NO GROWTH 4 DAYS Performed at [facility], [place] [address] [place], [place] [address]    Report Status PENDING  Incomplete  Aerobic/Anaerobic Culture (surgical/deep wound)     Status: None (Preliminary result)   Collection Time: 03/25/20  8:12 PM   Specimen: Wound; Tissue  Result Value Ref Range Status   Specimen Description   Final    WOUND Performed at [place] [address] [place], [place] [address]    Special Requests   Final    NONE Performed at [facility], [place] [address], [place], [place] [address]    Gram Stain   Final    NO WBC SEEN NO ORGANISMS SEEN Performed at [facility], [place] [address], [place], [place] [address]    Culture   Final    RARE STAPHYLOCOCCUS LUGDUNENSIS NO ANAEROBES ISOLATED; CULTURE IN PROGRESS FOR 5 DAYS    Report Status PENDING  Incomplete  Aerobic/Anaerobic Culture (surgical/deep wound)     Status: None (Preliminary result)   Collection Time: 03/27/20  8:30 AM   Specimen: Foot, Right; Wound  Result Value Ref Range Status   Specimen  Description   Final    FOOT RIGHT 5TH METATARSAL Performed at [facility] [address], [place], [place] [address]    Special Requests   Final    NONE Performed at [facility], [place] [address], [place], [place] [address]    Gram Stain NO WBC SEEN NO ORGANISMS SEEN   Final   Culture   Final    NO GROWTH 2 DAYS Performed at [facility], [place] [address] [place],  [address]    Report Status PENDING  Incomplete   Assessment & Plan:  Patient was evaluated and treated and all questions answered.   S/p TMA with Rotation flap, residual lateral wound -Wound VAC removed. Wound healing well. Reapply VAC today. Orders in for this change. -Continue empiric abx. Cultures pending. No growth of clean margin of bone x2 days. If this remains negative should need only PO abx at d/c. Previous cultures proteus and skin flora.  -Heel WB in boot RLE -PT/OT -Will continue to follow. -WBC elevated, wound well appearing. Will monitor. -Anticipate d/c tomorrow or Monday.  [name], DPM  Accessible via secure chat for questions or concerns.

## 2020-03-29 NOTE — Progress Notes (Signed)
Patient's blood glucose was 68, standing order for adult hypoglycemia initiated. Patient is asymptomatic.

## 2020-03-29 NOTE — Evaluation (Signed)
Physical Therapy Evaluation Patient Details Name: [name] MRN: [id number] DOB: 27-Jul-1970 Today's Date: 03/29/2020   History of Present Illness  50 year old male with past medical history of diabetes mellitus type 2, prior history of amputation L foot (great toe amp), hypertension, CKD stage III presented with worsening right foot. He had foul-smelling discharge coming from the R foot wound.  He was seen by podiatry and is now s/p TMA R  foot with wound vac placement  Clinical Impression  Pt admitted with above diagnosis.  Pt with extremely unsteady gait today. Requiring +2 mod assist for balance to amb a few steps with RW. May need SNF however recommend HHPT if pt not agreeable, he may need to function from w/c level initially if d/c's home   Pt currently with functional limitations due to the deficits listed below (see PT Problem List). Pt will benefit from skilled PT to increase their independence and safety with mobility to allow discharge to the venue listed below.       Follow Up Recommendations SNF;Home health PT (pending progress)    Equipment Recommendations  None recommended by PT    Recommendations for Other Services       Precautions / Restrictions Precautions Precautions: Fall Required Braces or Orthoses: Other Brace Other Brace: cam boot on R Restrictions Weight Bearing Restrictions: No Other Position/Activity Restrictions: WBAT in camboot      Mobility  Bed Mobility                    Transfers Overall transfer level: Needs assistance Equipment used: Rolling walker (2 wheeled) Transfers: Sit to/from Stand Sit to Stand: Mod assist;+2 safety/equipment         General transfer comment: cues for hand placement  Ambulation/Gait Ambulation/Gait assistance: Mod assist;+2 physical assistance;Min assist Gait Distance (Feet): 6 Feet Assistive device: Rolling walker (2 wheeled) Gait Pattern/deviations: Step-to pattern;Step-through pattern      General Gait Details: extremely unsteady, repeated anterior LOB, difficulty maintaining decr step length to improve balance and safety and stay within frame of RW. requiring +2 throughout for balance/fall prevention.  Stairs            Wheelchair Mobility    Modified Rankin (Stroke Patients Only)       Balance Overall balance assessment: Needs assistance Sitting-balance support: Feet supported Sitting balance-Leahy Scale: Fair Sitting balance - Comments: LOB, to L initially, requiring assist back  to midline     Standing balance-Leahy Scale: Poor Standing balance comment: reliant on UEs and external assist                             Pertinent Vitals/Pain Pain Assessment: No/denies pain    Home Living Family/patient expects to be discharged to:: Private residence Living Arrangements: Spouse/significant other Available Help at Discharge: Family;Available PRN/intermittently Type of Home: House Home Access: Ramped entrance     Home Layout: One level Home Equipment: Walker - 2 wheels;Wheelchair - manual      Prior Function Level of Independence: Independent         Comments: girlfriend has CP, uses a w/c     Hand Dominance        Extremity/Trunk Assessment   Upper Extremity Assessment Upper Extremity Assessment: Defer to OT evaluation    Lower Extremity Assessment Lower Extremity Assessment: Generalized weakness       Communication   Communication: No difficulties  Cognition Arousal/Alertness: Awake/alert Behavior During Therapy: [facility]  for tasks assessed/performed Overall Cognitive Status: Within Functional Limits for tasks assessed                                        General Comments      Exercises     Assessment/Plan    PT Assessment Patient needs continued PT services  PT Problem List Decreased strength;Decreased activity tolerance;Decreased balance;Decreased mobility;Decreased knowledge of use of  DME;Decreased knowledge of precautions       PT Treatment Interventions DME instruction;Therapeutic activities;Functional mobility training;Gait training;Therapeutic exercise;Patient/family education    PT Goals (Current goals can be found in the Care Plan section)  Acute Rehab PT Goals Patient Stated Goal: home PT Goal Formulation: With patient Time For Goal Achievement: 04/12/20 Potential to Achieve Goals: Good    Frequency Min 3X/week   Barriers to discharge        Co-evaluation               AM-PAC PT "6 Clicks" Mobility  Outcome Measure Help needed turning from your back to your side while in a flat bed without using bedrails?: A Little Help needed moving from lying on your back to sitting on the side of a flat bed without using bedrails?: A Little Help needed moving to and from a bed to a chair (including a wheelchair)?: A Lot Help needed standing up from a chair using your arms (e.g., wheelchair or bedside chair)?: A Lot Help needed to walk in hospital room?: A Lot Help needed climbing 3-5 steps with a railing? : Total 6 Click Score: 13    End of Session Equipment Utilized During Treatment: Gait belt Activity Tolerance: Patient tolerated treatment well Patient left: in chair;with call bell/phone within reach;with chair alarm set Nurse Communication: Mobility status PT Visit Diagnosis: Other abnormalities of gait and mobility (R26.89)    Time: 0383-3383 PT Time Calculation (min) (ACUTE ONLY): 15 min   Charges:   PT Evaluation $PT Eval Low Complexity: [place], PT  Acute Rehab Dept ([place]) [phone] Pager [phone]  03/29/2020   [facility] 03/29/2020, 12:50 PM

## 2020-03-30 DIAGNOSIS — A480 Gas gangrene: Secondary | ICD-10-CM | POA: Diagnosis not present

## 2020-03-30 DIAGNOSIS — I10 Essential (primary) hypertension: Secondary | ICD-10-CM | POA: Diagnosis not present

## 2020-03-30 DIAGNOSIS — D72829 Elevated white blood cell count, unspecified: Secondary | ICD-10-CM

## 2020-03-30 LAB — GLUCOSE, CAPILLARY
Glucose-Capillary: 125 mg/dL — ABNORMAL HIGH (ref 70–99)
Glucose-Capillary: 129 mg/dL — ABNORMAL HIGH (ref 70–99)
Glucose-Capillary: 139 mg/dL — ABNORMAL HIGH (ref 70–99)
Glucose-Capillary: 157 mg/dL — ABNORMAL HIGH (ref 70–99)
Glucose-Capillary: 84 mg/dL (ref 70–99)
Glucose-Capillary: 96 mg/dL (ref 70–99)

## 2020-03-30 LAB — BLOOD CULTURE ID PANEL (REFLEXED) - BCID2

## 2020-03-30 LAB — CBC WITH DIFFERENTIAL/PLATELET
Abs Immature Granulocytes: 0.26 10*3/uL — ABNORMAL HIGH (ref 0.00–0.07)
Basophils Absolute: 0.1 10*3/uL (ref 0.0–0.1)
Basophils Relative: 0 %
Eosinophils Absolute: 0.5 10*3/uL (ref 0.0–0.5)
Eosinophils Relative: 2 %
HCT: 26.4 % — ABNORMAL LOW (ref 39.0–52.0)
Hemoglobin: 8.1 g/dL — ABNORMAL LOW (ref 13.0–17.0)
Immature Granulocytes: 1 %
Lymphocytes Relative: 8 %
Lymphs Abs: 2.1 10*3/uL (ref 0.7–4.0)
MCH: 27.3 pg (ref 26.0–34.0)
MCHC: 30.7 g/dL (ref 30.0–36.0)
MCV: 88.9 fL (ref 80.0–100.0)
Monocytes Absolute: 1.4 10*3/uL — ABNORMAL HIGH (ref 0.1–1.0)
Monocytes Relative: 6 %
Neutro Abs: 21.7 10*3/uL — ABNORMAL HIGH (ref 1.7–7.7)
Neutrophils Relative %: 83 %
Platelets: 443 10*3/uL — ABNORMAL HIGH (ref 150–400)
RBC: 2.97 MIL/uL — ABNORMAL LOW (ref 4.22–5.81)
RDW: 15.1 % (ref 11.5–15.5)
WBC: 26 10*3/uL — ABNORMAL HIGH (ref 4.0–10.5)
nRBC: 0 % (ref 0.0–0.2)

## 2020-03-30 LAB — CULTURE, BLOOD (ROUTINE X 2)
Culture: NO GROWTH
Special Requests: ADEQUATE

## 2020-03-30 LAB — COMPREHENSIVE METABOLIC PANEL
ALT: 15 U/L (ref 0–44)
AST: 15 U/L (ref 15–41)
Albumin: 2.3 g/dL — ABNORMAL LOW (ref 3.5–5.0)
Alkaline Phosphatase: 60 U/L (ref 38–126)
Anion gap: 7 (ref 5–15)
BUN: 29 mg/dL — ABNORMAL HIGH (ref 6–20)
CO2: 17 mmol/L — ABNORMAL LOW (ref 22–32)
Calcium: 8 mg/dL — ABNORMAL LOW (ref 8.9–10.3)
Chloride: 109 mmol/L (ref 98–111)
Creatinine, Ser: 2.45 mg/dL — ABNORMAL HIGH (ref 0.61–1.24)
GFR, Estimated: 31 mL/min — ABNORMAL LOW (ref >60)
Glucose, Bld: 135 mg/dL — ABNORMAL HIGH (ref 70–99)
Potassium: 3.7 mmol/L (ref 3.5–5.1)
Sodium: 133 mmol/L — ABNORMAL LOW (ref 135–145)
Total Bilirubin: 0.2 mg/dL — ABNORMAL LOW (ref 0.3–1.2)
Total Protein: 6.5 g/dL (ref 6.5–8.1)

## 2020-03-30 LAB — MAGNESIUM: Magnesium: 1.7 mg/dL (ref 1.7–2.4)

## 2020-03-30 NOTE — Progress Notes (Signed)
Patient ID: [name], male   DOB: 1970-08-06, 50 y.o.   MRN: [id number]  PROGRESS NOTE    [name]  HYW:[id number] DOB: 1970/10/29 DOA: 03/25/2020 PCP: Gildardo Pounds, NP   Brief Narrative:  50 year old male with history of diabetes mellitus type 2, prior amputation of the hypertension, chronic renal disease stage III presented with worsening right cellulitis wound with foul-smelling discharge.  He was started on broad-spectrum antibiotics for right foot cellulitis concerning for necrosis and gangrene.  Podiatry was consulted.  He underwent I&D of right foot along with partial fifth ray resection.  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  Currently still on broad-spectrum antibiotics including Rocephin, vancomycin and Flagyl.  Wound cultures growing Staphylococcus lugdunensis: Susceptibilities pending.  Blood cultures negative so far -Currently afebrile with worsening leukocytosis -PT recommends SNF/home health PT.  Consult social worker  Leukocytosis -Worsening.  Continue antibiotics.  Follow cultures  Gastroenteritis -Improving.  Advance diet as tolerated.  DC IV fluids -Use antiemetics as needed  Diabetes mellitus type 2 -Continue CBGs with SSI  Chronic renal disease stage IIIb -Creatinine 2.45 today.  Monitor  Anemia of chronic disease -From renal failure.  Hemoglobin stable.  Monitor  Hyponatremia -Sodium slightly on the lower side.  Monitor  Neuropathy -Continue gabapentin  Hypertension -Blood pressure stable.  Continue with Norvasc, Coreg and clonidine  Tobacco abuse -Continue nicotine patch  DVT prophylaxis: Heparin subcutaneous Code Status: Full Family Communication: None at bedside Disposition Plan: Status is: Inpatient  Remains inpatient appropriate because:Inpatient level of care  appropriate due to severity of illness   Dispo: The patient is from: Home              Anticipated d/c is to: Home versus SNF              Anticipated d/c date is: 2 days              Patient currently is not medically stable to d/c.  Consultants: Podiatry  Procedures: I&D for gas gangrene of right foot along with partial fifth ray resection  Antimicrobials:  Anti-infectives (From admission, onward)   Start     Dose/Rate Route Frequency Ordered Stop   03/29/20 1800  vancomycin (VANCOCIN) IVPB 1000 mg/200 mL premix        1,000 mg 200 mL/hr over 60 Minutes Intravenous Every 48 hours 03/28/20 1012     03/27/20 1500  vancomycin (VANCOREADY) IVPB 1250 mg/250 mL  Status:  Discontinued        1,250 mg 166.7 mL/hr over 90 Minutes Intravenous Every 48 hours 03/25/20 2020 03/28/20 1012   03/27/20 0815  vancomycin (VANCOCIN) powder  Status:  Discontinued          As needed 03/27/20 0815 03/27/20 1103   03/27/20 0600  clindamycin (CLEOCIN) IVPB 900 mg        900 mg 100 mL/hr over 30 Minutes Intravenous On call to O.R. 03/26/20 1633 03/27/20 0745   03/25/20 2200  metroNIDAZOLE (FLAGYL) tablet 500 mg        500 mg Oral Every 8 hours 03/25/20 1841     03/25/20 1845  cefTRIAXone (ROCEPHIN) 2 g in sodium chloride 0.9 % 100 mL IVPB        2 g 200 mL/hr over 30 Minutes Intravenous Every 24 hours 03/25/20 1841     03/25/20  1500  vancomycin (VANCOREADY) IVPB 1500 mg/300 mL        1,500 mg 150 mL/hr over 120 Minutes Intravenous  Once 03/25/20 1426 03/25/20 1744   03/25/20 1430  ceFEPIme (MAXIPIME) 2 g in sodium chloride 0.9 % 100 mL IVPB        2 g 200 mL/hr over 30 Minutes Intravenous  Once 03/25/20 1425 03/25/20 1510   03/25/20 1430  metroNIDAZOLE (FLAGYL) tablet 500 mg        500 mg Oral  Once 03/25/20 1425 03/25/20 1442       Subjective: Patient seen and examined at bedside.  Denies worsening foot pain.  No overnight fever, vomiting or shortness of breath reported.  Objective: Vitals:    03/29/20 0431 03/29/20 1344 03/29/20 2030 03/30/20 0423  BP: (!) 153/83 115/71 (!) 144/82 132/79  Pulse: 87 76 76 70  Resp: 20 18 18 18   Temp: 99.1 F (37.3 C) 98.4 F (36.9 C) 98.4 F (36.9 C) 98.2 F (36.8 C)  TempSrc: Oral Oral Oral Oral  SpO2: 100% 100% 99% 98%  Weight:      Height:        Intake/Output Summary (Last 24 hours) at 03/30/2020 1058 Last data filed at 03/30/2020 1005 Gross per 24 hour  Intake 4125 ml  Output 1150 ml  Net 2975 ml   Filed Weights   03/25/20 1916  Weight: 90.7 kg    Examination:  General exam: Appears calm and comfortable.  Looks chronically ill. Respiratory system: Bilateral decreased breath sounds at bases Cardiovascular system: S1 & S2 heard, Rate controlled Gastrointestinal system: Abdomen is nondistended, soft and nontender. Normal bowel sounds heard. Extremities: No cyanosis, clubbing; trace lower extremity edema.  Right foot dressing present Central nervous system: Alert and oriented. No focal neurological deficits. Moving extremities Skin: No other obvious ecchymosis/lesions  psychiatry: Flat affect   Data Reviewed: I have personally reviewed following labs and imaging studies  CBC: Recent Labs  Lab 03/25/20 1500 03/26/20 0403 03/27/20 0426 03/28/20 0305 03/29/20 0443 03/30/20 0749  WBC 17.8* 16.6* 14.0* 15.4* 23.6* 26.0*  NEUTROABS 13.1*  --   --   --   --  21.7*  HGB 9.4* 8.2* 7.6* 7.8* 8.4* 8.1*  HCT 30.1* 26.5* 24.7* 26.1* 27.4* 26.4*  MCV 87.0 88.6 88.5 91.6 89.5 88.9  PLT 428* 382 376 378 423* 941*   Basic Metabolic Panel: Recent Labs  Lab 03/25/20 1500 03/26/20 0403 03/27/20 0426 03/29/20 0443 03/30/20 0749  NA 138 137 136 137 133*  K 3.9 4.4 3.9 3.7 3.7  CL 110 112* 110 112* 109  CO2 19* 17* 19* 17* 17*  GLUCOSE 193* 160* 121* 113* 135*  BUN 29* 26* 28* 36* 29*  CREATININE 2.58* 2.67* 2.89* 2.79* 2.45*  CALCIUM 8.6* 8.4* 8.1* 8.2* 8.0*  MG  --   --   --   --  1.7   GFR: Estimated Creatinine  Clearance: 36.6 mL/min (A) (by C-G formula based on SCr of 2.45 mg/dL (H)). Liver Function Tests: Recent Labs  Lab 03/25/20 1500 03/26/20 0403 03/30/20 0749  AST 14* 13* 15  ALT 28 21 15   ALKPHOS 83 68 60  BILITOT 0.3 0.4 0.2*  PROT 7.9 6.8 6.5  ALBUMIN 2.9* 2.6* 2.3*   No results for input(s): LIPASE, AMYLASE in the last 168 hours. No results for input(s): AMMONIA in the last 168 hours. Coagulation Profile: No results for input(s): INR, PROTIME in the last 168 hours. Cardiac Enzymes: No results for  input(s): CKTOTAL, CKMB, CKMBINDEX, TROPONINI in the last 168 hours. BNP (last 3 results) No results for input(s): PROBNP in the last 8760 hours. HbA1C: No results for input(s): HGBA1C in the last 72 hours. CBG: Recent Labs  Lab 03/29/20 1715 03/29/20 2027 03/30/20 0019 03/30/20 0419 03/30/20 0739  GLUCAP 132* 133* 157* 139* 125*   Lipid Profile: No results for input(s): CHOL, HDL, LDLCALC, TRIG, CHOLHDL, LDLDIRECT in the last 72 hours. Thyroid Function Tests: No results for input(s): TSH, T4TOTAL, FREET4, T3FREE, THYROIDAB in the last 72 hours. Anemia Panel: No results for input(s): VITAMINB12, FOLATE, FERRITIN, TIBC, IRON, RETICCTPCT in the last 72 hours. Sepsis Labs: Recent Labs  Lab 03/25/20 1500  LATICACIDVEN 1.3    Recent Results (from the past 240 hour(s))  WOUND CULTURE     Status: None   Collection Time: 03/25/20 11:00 AM   Specimen: Foot; Wound  Result Value Ref Range Status   MICRO NUMBER: [id number]  Final   SPECIMEN QUALITY: Adequate  Final   SOURCE: ULCER RIGHT FOOT  Final   STATUS: FINAL  Final   GRAM STAIN:   Final    Many White blood cells seen Rare epithelial cells Many Gram negative bacilli Many Gram positive cocci in clusters   RESULT:   Final    Growth of skin flora (note: Growth does not include S. aureus, beta-hemolytic Streptococci or P. aeruginosa).  Resp Panel by RT-PCR (Flu A&B, Covid) Nasopharyngeal Swab     Status: None   Collection  Time: 03/25/20  2:24 PM   Specimen: Nasopharyngeal Swab; Nasopharyngeal(NP) swabs in vial transport medium  Result Value Ref Range Status   SARS Coronavirus 2 by RT PCR NEGATIVE NEGATIVE Final    Comment: (NOTE) SARS-CoV-2 target nucleic acids are NOT DETECTED.  The SARS-CoV-2 RNA is generally detectable in upper respiratory specimens during the acute phase of infection. The lowest concentration of SARS-CoV-2 viral copies this assay can detect is 138 copies/mL. A negative result does not preclude SARS-Cov-2 infection and should not be used as the sole basis for treatment or other patient management decisions. A negative result may occur with  improper specimen collection/handling, submission of specimen other than nasopharyngeal swab, presence of viral mutation(s) within the areas targeted by this assay, and inadequate number of viral copies(<138 copies/mL). A negative result must be combined with clinical observations, patient history, and epidemiological information. The expected result is Negative.  Fact Sheet for Patients:  EntrepreneurPulse.com.au  Fact Sheet for Healthcare Providers:  IncredibleEmployment.be  This test is no t yet approved or cleared by the [place] FDA and  has been authorized for detection and/or diagnosis of SARS-CoV-2 by FDA under an Emergency Use Authorization (EUA). This EUA will remain  in effect (meaning this test can be used) for the duration of the COVID-19 declaration under Section 564(b)(1) of the Act, 21 U.S.C.section 360bbb-3(b)(1), unless the authorization is terminated  or revoked sooner.       Influenza A by PCR NEGATIVE NEGATIVE Final   Influenza B by PCR NEGATIVE NEGATIVE Final    Comment: (NOTE) The Xpert Xpress SARS-CoV-2/FLU/RSV plus assay is intended as an aid in the diagnosis of influenza from Nasopharyngeal swab specimens and should not be used as a sole basis for treatment. Nasal washings  and aspirates are unacceptable for Xpert Xpress SARS-CoV-2/FLU/RSV testing.  Fact Sheet for Patients: EntrepreneurPulse.com.au  Fact Sheet for Healthcare Providers: IncredibleEmployment.be  This test is not yet approved or cleared by the [place] FDA and has been  authorized for detection and/or diagnosis of SARS-CoV-2 by FDA under an Emergency Use Authorization (EUA). This EUA will remain in effect (meaning this test can be used) for the duration of the COVID-19 declaration under Section 564(b)(1) of the Act, 21 U.S.C. section 360bbb-3(b)(1), unless the authorization is terminated or revoked.  Performed at [facility], [place] [address], [place], [place] [address]   Culture, blood (routine x 2)     Status: None (Preliminary result)   Collection Time: 03/25/20  3:00 PM   Specimen: BLOOD  Result Value Ref Range Status   Specimen Description   Final    BLOOD LEFT ANTECUBITAL Performed at [place] [address] [place], [place] [address]    Special Requests   Final    BOTTLES DRAWN AEROBIC AND ANAEROBIC Blood Culture adequate volume Performed at [facility] [address] [place], [place] [address]    Culture  Setup Time   Final    GRAM POSITIVE COCCI IN CLUSTERS ANAEROBIC BOTTLE ONLY Organism ID to follow CRITICAL RESULT CALLED TO, READ BACK BY AND VERIFIED WITH: C. SHADE PHARMD, AT 1055 03/30/20 BY Rush Landmark Performed at [facility], [place] [address], [place], [place] [address]    Culture GRAM POSITIVE COCCI  Final   Report Status PENDING  Incomplete  Culture, blood (routine x 2)     Status: None   Collection Time: 03/25/20  3:00 PM   Specimen: BLOOD LEFT HAND  Result Value Ref Range Status   Specimen Description   Final    BLOOD LEFT HAND Performed at [facility] [address] [place], [place] [address]    Special Requests   Final     BOTTLES DRAWN AEROBIC AND ANAEROBIC Blood Culture adequate volume Performed at [place] [address], [place], [place] [address]    Culture   Final    NO GROWTH 5 DAYS Performed at [facility], [place] [address] [place],  [address]    Report Status 03/30/2020 FINAL  Final  Blood Culture ID Panel (Reflexed)     Status: None   Collection Time: 03/25/20  3:00 PM  Result Value Ref Range Status   Enterococcus faecalis NOT DETECTED NOT DETECTED Final   Enterococcus Faecium NOT DETECTED NOT DETECTED Final   Listeria monocytogenes NOT DETECTED NOT DETECTED Final   Staphylococcus species NOT DETECTED NOT DETECTED Final   Staphylococcus aureus (BCID) NOT DETECTED NOT DETECTED Final   Staphylococcus epidermidis NOT DETECTED NOT DETECTED Final   Staphylococcus lugdunensis NOT DETECTED NOT DETECTED Final   Streptococcus species NOT DETECTED NOT DETECTED Final   Streptococcus agalactiae NOT DETECTED NOT DETECTED Final   Streptococcus pneumoniae NOT DETECTED NOT DETECTED Final   Streptococcus pyogenes NOT DETECTED NOT DETECTED Final   A.calcoaceticus-baumannii NOT DETECTED NOT DETECTED Final   Bacteroides fragilis NOT DETECTED NOT DETECTED Final   Enterobacterales NOT DETECTED NOT DETECTED Final   Enterobacter cloacae complex NOT DETECTED NOT DETECTED Final   Escherichia coli NOT DETECTED NOT DETECTED Final   Klebsiella aerogenes NOT DETECTED NOT DETECTED Final   Klebsiella oxytoca NOT DETECTED NOT DETECTED Final   Klebsiella pneumoniae NOT DETECTED NOT DETECTED Final   Proteus species NOT DETECTED NOT DETECTED Final   Salmonella species NOT DETECTED NOT DETECTED Final   Serratia marcescens NOT DETECTED NOT DETECTED Final   Haemophilus influenzae NOT DETECTED NOT DETECTED Final   Neisseria meningitidis NOT DETECTED NOT DETECTED Final  Pseudomonas aeruginosa NOT DETECTED NOT DETECTED Final   Stenotrophomonas maltophilia NOT DETECTED NOT DETECTED Final    Candida albicans NOT DETECTED NOT DETECTED Final   Candida auris NOT DETECTED NOT DETECTED Final   Candida glabrata NOT DETECTED NOT DETECTED Final   Candida krusei NOT DETECTED NOT DETECTED Final   Candida parapsilosis NOT DETECTED NOT DETECTED Final   Candida tropicalis NOT DETECTED NOT DETECTED Final   Cryptococcus neoformans/gattii NOT DETECTED NOT DETECTED Final    Comment: Performed at [facility], [place] [address] [place], [place] [address]  Aerobic/Anaerobic Culture (surgical/deep wound)     Status: None (Preliminary result)   Collection Time: 03/25/20  8:12 PM   Specimen: Wound; Tissue  Result Value Ref Range Status   Specimen Description   Final    WOUND Performed at [facility] [address], [place], [place] [address]    Special Requests   Final    NONE Performed at [facility], [place] [address], [place], [place] [address]    Gram Stain   Final    NO WBC SEEN NO ORGANISMS SEEN Performed at [facility], [place] [address], [place], [place] [address]    Culture   Final    RARE STAPHYLOCOCCUS LUGDUNENSIS SUSCEPTIBILITIES TO FOLLOW NO ANAEROBES ISOLATED; CULTURE IN PROGRESS FOR 5 DAYS    Report Status PENDING  Incomplete  Aerobic/Anaerobic Culture (surgical/deep wound)     Status: None (Preliminary result)   Collection Time: 03/27/20  8:30 AM   Specimen: Foot, Right; Wound  Result Value Ref Range Status   Specimen Description   Final    FOOT RIGHT 5TH METATARSAL Performed at [facility] [address] [place], [place] [address]    Special Requests   Final    NONE Performed at [facility], [place] [address] [place], [place] [address]    Gram Stain NO WBC SEEN NO ORGANISMS SEEN   Final   Culture   Final    NO GROWTH 3 DAYS Performed at [facility], [place] [address] [place], [place] [address]    Report Status PENDING  Incomplete         Radiology Studies: DG  Abd 2 Views  Result Date: 03/28/2020 CLINICAL DATA:  RIGHT foot wound. EXAM: ABDOMEN - 2 VIEW COMPARISON:  None. FINDINGS: Scattered nonspecific air-fluid levels within the small and large bowel. No dilated large or small bowel loops. No evidence of free intraperitoneal air is seen. No acute appearing osseous abnormality. IMPRESSION: 1. Scattered nonspecific air-fluid levels throughout the abdomen, involving both large and small bowel, suggesting gastroenteritis or enterocolitis of infectious or inflammatory nature. 2. No evidence of bowel obstruction. Electronically Signed   By: Franki Cabot M.D.   On: 03/28/2020 15:40        Scheduled Meds: . (feeding supplement) PROSource Plus  30 mL Oral BID BM  . amLODipine  10 mg Oral Daily  . aspirin EC  81 mg Oral Daily  . atorvastatin  20 mg Oral Daily  . B-complex with vitamin C  1 tablet Oral Daily  . buPROPion  150 mg Oral Daily  . carvedilol  25 mg Oral BID  . Chlorhexidine Gluconate Cloth  6 each Topical UD  . cloNIDine  0.1 mg Oral BID  . escitalopram  10 mg Oral QHS  . ferrous sulfate  325 mg Oral Daily  . gabapentin  300 mg Oral TID  . heparin  5,000 Units Subcutaneous Q8H  . insulin aspart  0-9 Units Subcutaneous Q4H  . insulin glargine  15 Units Subcutaneous BID  . metroNIDAZOLE  500 mg Oral Q8H  . nicotine  7 mg Transdermal Daily  . nutrition supplement (JUVEN)  1 packet Oral BID BM   Continuous Infusions: . cefTRIAXone (ROCEPHIN)  IV 2 g (03/29/20 1733)  . dextrose 5 % and 0.45% NaCl 100 mL/hr at 03/30/20 0543  . lactated ringers Stopped (03/28/20 1121)  . methocarbamol (ROBAXIN) IV    . vancomycin 1,000 mg (03/29/20 1829)          Aline August, MD [facility] 03/30/2020, 10:58 AM

## 2020-03-30 NOTE — Progress Notes (Signed)
PHARMACY - PHYSICIAN COMMUNICATION CRITICAL VALUE ALERT - BLOOD CULTURE IDENTIFICATION (BCID)  [name] is an 50 y.o. male who presented to [facility] on 03/25/2020 with a chief complaint of diabetic foot infection s/p I&D and amputation on 12/28 and 12/30.  Cultures of foot/abscess growing rare staph lugdunensis.  Assessment:   12/28 BCx: 1/4 bottles with GPC clusters.  BCID = no results  Name of physician (or Provider) Contacted: Dr. Starla Link  Current antibiotics: Vancomycin, Ceftriaxone, Metronidazole  Changes to prescribed antibiotics recommended:  Patient is on recommended antibiotics - No changes needed  Results for orders placed or performed during the hospital encounter of 03/25/20  Blood Culture ID Panel (Reflexed) (Collected: 03/25/2020  3:00 PM)  Result Value Ref Range   Enterococcus faecalis NOT DETECTED NOT DETECTED   Enterococcus Faecium NOT DETECTED NOT DETECTED   Listeria monocytogenes NOT DETECTED NOT DETECTED   Staphylococcus species NOT DETECTED NOT DETECTED   Staphylococcus aureus (BCID) NOT DETECTED NOT DETECTED   Staphylococcus epidermidis NOT DETECTED NOT DETECTED   Staphylococcus lugdunensis NOT DETECTED NOT DETECTED   Streptococcus species NOT DETECTED NOT DETECTED   Streptococcus agalactiae NOT DETECTED NOT DETECTED   Streptococcus pneumoniae NOT DETECTED NOT DETECTED   Streptococcus pyogenes NOT DETECTED NOT DETECTED   A.calcoaceticus-baumannii NOT DETECTED NOT DETECTED   Bacteroides fragilis NOT DETECTED NOT DETECTED   Enterobacterales NOT DETECTED NOT DETECTED   Enterobacter cloacae complex NOT DETECTED NOT DETECTED   Escherichia coli NOT DETECTED NOT DETECTED   Klebsiella aerogenes NOT DETECTED NOT DETECTED   Klebsiella oxytoca NOT DETECTED NOT DETECTED   Klebsiella pneumoniae NOT DETECTED NOT DETECTED   Proteus species NOT DETECTED NOT DETECTED   Salmonella species NOT DETECTED NOT DETECTED   Serratia marcescens NOT DETECTED NOT DETECTED    Haemophilus influenzae NOT DETECTED NOT DETECTED   Neisseria meningitidis NOT DETECTED NOT DETECTED   Pseudomonas aeruginosa NOT DETECTED NOT DETECTED   Stenotrophomonas maltophilia NOT DETECTED NOT DETECTED   Candida albicans NOT DETECTED NOT DETECTED   Candida auris NOT DETECTED NOT DETECTED   Candida glabrata NOT DETECTED NOT DETECTED   Candida krusei NOT DETECTED NOT DETECTED   Candida parapsilosis NOT DETECTED NOT DETECTED   Candida tropicalis NOT DETECTED NOT DETECTED   Cryptococcus neoformans/gattii NOT DETECTED NOT DETECTED    Gretta Arab PharmD, BCPS Clinical Pharmacist [facility] main pharmacy [phone] 03/30/2020 10:57 AM

## 2020-03-31 ENCOUNTER — Encounter (HOSPITAL_COMMUNITY): Payer: Self-pay | Admitting: Podiatry

## 2020-03-31 ENCOUNTER — Inpatient Hospital Stay (HOSPITAL_COMMUNITY): Payer: Medicaid Other

## 2020-03-31 DIAGNOSIS — T148XXA Other injury of unspecified body region, initial encounter: Secondary | ICD-10-CM | POA: Diagnosis not present

## 2020-03-31 DIAGNOSIS — R7881 Bacteremia: Secondary | ICD-10-CM

## 2020-03-31 DIAGNOSIS — N184 Chronic kidney disease, stage 4 (severe): Secondary | ICD-10-CM

## 2020-03-31 DIAGNOSIS — A480 Gas gangrene: Secondary | ICD-10-CM | POA: Diagnosis not present

## 2020-03-31 LAB — CBC WITH DIFFERENTIAL/PLATELET
Abs Immature Granulocytes: 0.23 10*3/uL — ABNORMAL HIGH (ref 0.00–0.07)
Basophils Absolute: 0.1 10*3/uL (ref 0.0–0.1)
Basophils Relative: 0 %
Eosinophils Absolute: 0.5 10*3/uL (ref 0.0–0.5)
Eosinophils Relative: 2 %
HCT: 25.4 % — ABNORMAL LOW (ref 39.0–52.0)
Hemoglobin: 7.9 g/dL — ABNORMAL LOW (ref 13.0–17.0)
Immature Granulocytes: 1 %
Lymphocytes Relative: 9 %
Lymphs Abs: 2.1 10*3/uL (ref 0.7–4.0)
MCH: 27.2 pg (ref 26.0–34.0)
MCHC: 31.1 g/dL (ref 30.0–36.0)
MCV: 87.6 fL (ref 80.0–100.0)
Monocytes Absolute: 1.1 10*3/uL — ABNORMAL HIGH (ref 0.1–1.0)
Monocytes Relative: 4 %
Neutro Abs: 20.3 10*3/uL — ABNORMAL HIGH (ref 1.7–7.7)
Neutrophils Relative %: 84 %
Platelets: 451 10*3/uL — ABNORMAL HIGH (ref 150–400)
RBC: 2.9 MIL/uL — ABNORMAL LOW (ref 4.22–5.81)
RDW: 15.2 % (ref 11.5–15.5)
WBC: 24.3 10*3/uL — ABNORMAL HIGH (ref 4.0–10.5)
nRBC: 0 % (ref 0.0–0.2)

## 2020-03-31 LAB — GLUCOSE, CAPILLARY
Glucose-Capillary: 42 mg/dL — CL (ref 70–99)
Glucose-Capillary: 90 mg/dL (ref 70–99)
Glucose-Capillary: 72 mg/dL (ref 70–99)
Glucose-Capillary: 64 mg/dL — ABNORMAL LOW (ref 70–99)
Glucose-Capillary: 74 mg/dL (ref 70–99)
Glucose-Capillary: 82 mg/dL (ref 70–99)
Glucose-Capillary: 56 mg/dL — ABNORMAL LOW (ref 70–99)
Glucose-Capillary: 86 mg/dL (ref 70–99)
Glucose-Capillary: 101 mg/dL — ABNORMAL HIGH (ref 70–99)
Glucose-Capillary: 64 mg/dL — ABNORMAL LOW (ref 70–99)

## 2020-03-31 LAB — ECHOCARDIOGRAM COMPLETE
Area-P 1/2: 3.53 cm2
Calc EF: 64.1 %
Height: 64 in
S' Lateral: 2.4 cm
Single Plane A2C EF: 67.1 %
Single Plane A4C EF: 63.3 %
Weight: 3200 oz

## 2020-03-31 LAB — TYPE AND SCREEN
ABO/RH(D): O POS
Antibody Screen: NEGATIVE
Unit division: 0

## 2020-03-31 LAB — C-REACTIVE PROTEIN: CRP: 8.9 mg/dL — ABNORMAL HIGH (ref <1.0)

## 2020-03-31 LAB — BASIC METABOLIC PANEL
Anion gap: 7 (ref 5–15)
BUN: 29 mg/dL — ABNORMAL HIGH (ref 6–20)
CO2: 18 mmol/L — ABNORMAL LOW (ref 22–32)
Calcium: 8.2 mg/dL — ABNORMAL LOW (ref 8.9–10.3)
Chloride: 115 mmol/L — ABNORMAL HIGH (ref 98–111)
Creatinine, Ser: 2.34 mg/dL — ABNORMAL HIGH (ref 0.61–1.24)
GFR, Estimated: 33 mL/min — ABNORMAL LOW (ref >60)
Glucose, Bld: 65 mg/dL — ABNORMAL LOW (ref 70–99)
Potassium: 3.8 mmol/L (ref 3.5–5.1)
Sodium: 140 mmol/L (ref 135–145)

## 2020-03-31 LAB — BPAM RBC
Blood Product Expiration Date: 202201312359
Unit Type and Rh: 5100

## 2020-03-31 LAB — SURGICAL PATHOLOGY

## 2020-03-31 LAB — MAGNESIUM: Magnesium: 1.8 mg/dL (ref 1.7–2.4)

## 2020-03-31 MED ORDER — VANCOMYCIN HCL IN DEXTROSE 1-5 GM/200ML-% IV SOLN
1000.0000 mg | INTRAVENOUS | Status: DC
  Administered 2020-03-31 – 2020-04-01 (×2): 1000 mg via INTRAVENOUS
  Filled 2020-03-31 (×2): qty 200

## 2020-03-31 MED ORDER — DEXTROSE 50 % IV SOLN
INTRAVENOUS | Status: AC
  Filled 2020-03-31: qty 50

## 2020-03-31 MED ORDER — INSULIN GLARGINE 100 UNIT/ML ~~LOC~~ SOLN: 7.0000 [IU] | Freq: Two times a day (BID) | SUBCUTANEOUS | Status: DC

## 2020-03-31 MED ORDER — INSULIN GLARGINE 100 UNIT/ML ~~LOC~~ SOLN
5.0000 [IU] | Freq: Every day | SUBCUTANEOUS | Status: DC
  Administered 2020-04-02 – 2020-04-04 (×3): 5 [IU] via SUBCUTANEOUS
  Filled 2020-03-31 (×5): qty 0.05

## 2020-03-31 MED ORDER — INSULIN GLARGINE 100 UNIT/ML ~~LOC~~ SOLN
7.0000 [IU] | Freq: Two times a day (BID) | SUBCUTANEOUS | Status: DC
  Administered 2020-03-31: 7 [IU] via SUBCUTANEOUS
  Filled 2020-03-31 (×2): qty 0.07

## 2020-03-31 NOTE — Progress Notes (Signed)
Inpatient Diabetes Program Recommendations  AACE/ADA: New Consensus Statement on Inpatient Glycemic Control (2015)  Target Ranges:  Prepandial:   less than 140 mg/dL      Peak postprandial:   less than 180 mg/dL (1-2 hours)      Critically ill patients:  140 - 180 mg/dL   Results for CLARON, ROSENCRANS (MRN [id number]) as of 03/31/2020 07:17  Ref. Range 03/30/2020 00:19 03/30/2020 04:19 03/30/2020 07:39 03/30/2020 12:16 03/30/2020 16:49 03/30/2020 21:09  Glucose-Capillary Latest Ref Range: 70 - 99 mg/dL 157 (H)  2 units NOVOLOG  139 (H)  1 unit NOVOLOG  125 (H)  1 unit NOVOLOG  129 (H)  1 unit NOVOLOG  15 units LANTUS  96 84     15 units LANTUS   Results for DOMINGO, FUSON (MRN [id number]) as of 03/31/2020 07:17  Ref. Range 03/31/2020 01:04 03/31/2020 01:35 03/31/2020 03:35  Glucose-Capillary Latest Ref Range: 70 - 99 mg/dL 42 (LL) 90 72     Home DM Meds: Lantus 15 units BID       Humalog 0-12 units per SSI  Current Orders: Lantus 15 units BID      Novolog Sensitive Correction Scale/ SSI (0-9 units) Q4 hours     MD- Note patient with Severe Hypoglycemia at 1am today.  Please consider:  1. Reduce Lantus to 10 units BID  2. Change timing of the Novolog SSi to TID AC + HS (pt allowed PO diet)     --Will follow patient during hospitalization--  Wyn Quaker RN, MSN, CDE Diabetes Coordinator Inpatient Glycemic Control Team Team Pager: [phone] (8a-5p)

## 2020-03-31 NOTE — Progress Notes (Incomplete)
Patient's

## 2020-03-31 NOTE — Progress Notes (Signed)
Patient ID: [name], male   DOB: 06-02-1970, 50 y.o.   MRN: [id number]  PROGRESS NOTE    [name]  YDX:[id number] DOB: [birth year] DOA: 03/25/2020 PCP: Gildardo Pounds, NP   Brief Narrative:  50 year old male with history of diabetes mellitus type 2, prior amputation of the hypertension, chronic renal disease stage III presented with worsening right cellulitis wound with foul-smelling discharge.  He was started on broad-spectrum antibiotics for right foot cellulitis concerning for necrosis and gangrene.  Podiatry was consulted.  He underwent I&D of right foot along with partial fifth ray resection.  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  Currently still on broad-spectrum antibiotics including Rocephin, vancomycin and Flagyl.  Wound cultures growing Staphylococcus lugdunensis: Susceptibilities pending.  Blood cultures as below -Currently afebrile with significant leukocytosis -PT recommends SNF/home health PT.  Consult social worker  Gram-positive bacteremia -Blood cultures from 03/25/2020 was reported positive on 03/30/2020 for gram-positive cocci in clusters.  Await final identification and sensitivities.  Blood cultures repeated for today. -2D echo  Leukocytosis -WBCs 24.3 today; slight improvement since yesterday.  Continue antibiotics.  Follow cultures  Gastroenteritis -Improving.  Advance diet as tolerated.  Off IV fluids. -Use antiemetics as needed  Diabetes mellitus type 2 with hypoglycemia -Continue CBGs with SSI.  Decrease Lantus to 7 units twice a day.  Chronic renal disease stage IIIb -Creatinine 2.34 today.  Monitor  Anemia of chronic disease -From renal failure.  Hemoglobin stable.  Monitor  Hyponatremia -Improved.  Neuropathy -Continue gabapentin  Hypertension -Blood pressure stable.   Continue with Norvasc, Coreg and clonidine  Tobacco abuse -Continue nicotine patch  DVT prophylaxis: Heparin subcutaneous Code Status: Full Family Communication: None at bedside Disposition Plan: Status is: Inpatient  Remains inpatient appropriate because:Inpatient level of care appropriate due to severity of illness   Dispo: The patient is from: Home              Anticipated d/c is to: Home versus SNF              Anticipated d/c date is: 2 days              Patient currently is not medically stable to d/c.  Consultants: Podiatry  Procedures: I&D for gas gangrene of right foot along with partial fifth ray resection  Antimicrobials:  Anti-infectives (From admission, onward)   Start     Dose/Rate Route Frequency Ordered Stop   03/29/20 1800  vancomycin (VANCOCIN) IVPB 1000 mg/200 mL premix        1,000 mg 200 mL/hr over 60 Minutes Intravenous Every 48 hours 03/28/20 1012     03/27/20 1500  vancomycin (VANCOREADY) IVPB 1250 mg/250 mL  Status:  Discontinued        1,250 mg 166.7 mL/hr over 90 Minutes Intravenous Every 48 hours 03/25/20 2020 03/28/20 1012   03/27/20 0815  vancomycin (VANCOCIN) powder  Status:  Discontinued          As needed 03/27/20 0815 03/27/20 1103   03/27/20 0600  clindamycin (CLEOCIN) IVPB 900 mg        900 mg 100 mL/hr over 30 Minutes Intravenous On call to O.R. 03/26/20 1633 03/27/20 0745   03/25/20 2200  metroNIDAZOLE (FLAGYL) tablet 500 mg        500 mg Oral Every 8 hours 03/25/20 1841  03/25/20 1845  cefTRIAXone (ROCEPHIN) 2 g in sodium chloride 0.9 % 100 mL IVPB        2 g 200 mL/hr over 30 Minutes Intravenous Every 24 hours 03/25/20 1841     03/25/20 1500  vancomycin (VANCOREADY) IVPB 1500 mg/300 mL        1,500 mg 150 mL/hr over 120 Minutes Intravenous  Once 03/25/20 1426 03/25/20 1744   03/25/20 1430  ceFEPIme (MAXIPIME) 2 g in sodium chloride 0.9 % 100 mL IVPB        2 g 200 mL/hr over 30 Minutes Intravenous  Once 03/25/20 1425 03/25/20  1510   03/25/20 1430  metroNIDAZOLE (FLAGYL) tablet 500 mg        500 mg Oral  Once 03/25/20 1425 03/25/20 1442       Subjective: Patient seen and examined at bedside.  Denies worsening lower extremity pain.  No overnight fever, vomiting or worsening shortness of breath reported.  Wants to go home. Objective: Vitals:   03/30/20 0423 03/30/20 1257 03/30/20 2141 03/31/20 0541  BP: 132/79 140/80 (!) 142/75 (!) 141/76  Pulse: 70 75 82 82  Resp: 18 20 18 18   Temp: 98.2 F (36.8 C) 97.7 F (36.5 C) 98.2 F (36.8 C) 98.2 F (36.8 C)  TempSrc: Oral Oral Oral Oral  SpO2: 98% 100% 99% 100%  Weight:      Height:        Intake/Output Summary (Last 24 hours) at 03/31/2020 0821 Last data filed at 03/31/2020 0500 Gross per 24 hour  Intake 300 ml  Output 1015 ml  Net -715 ml   Filed Weights   03/25/20 1916  Weight: 90.7 kg    Examination:  General exam: Chronically ill looking.  No acute distress. Respiratory system: Decreased breath sounds at bases bilaterally.  No wheezing  cardiovascular system: Rate controlled, S1-S2 heard Gastrointestinal system: Abdomen is nondistended, soft and nontender.  Bowel sounds are heard  extremities: Mild lower extremity edema present.  No clubbing.  Right foot dressing present Central nervous system: Awake, slow to respond.  No focal neurological deficits.  Moves extremities  Skin: No obvious petechiae/other lesions psychiatry: Affect is flat   Data Reviewed: I have personally reviewed following labs and imaging studies  CBC: Recent Labs  Lab 03/25/20 1500 03/26/20 0403 03/27/20 0426 03/28/20 0305 03/29/20 0443 03/30/20 0749 03/31/20 0454  WBC 17.8*   < > 14.0* 15.4* 23.6* 26.0* 24.3*  NEUTROABS 13.1*  --   --   --   --  21.7* 20.3*  HGB 9.4*   < > 7.6* 7.8* 8.4* 8.1* 7.9*  HCT 30.1*   < > 24.7* 26.1* 27.4* 26.4* 25.4*  MCV 87.0   < > 88.5 91.6 89.5 88.9 87.6  PLT 428*   < > 376 378 423* 443* 451*   < > = values in this interval not  displayed.   Basic Metabolic Panel: Recent Labs  Lab 03/26/20 0403 03/27/20 0426 03/29/20 0443 03/30/20 0749 03/31/20 0454  NA 137 136 137 133* 140  K 4.4 3.9 3.7 3.7 3.8  CL 112* 110 112* 109 115*  CO2 17* 19* 17* 17* 18*  GLUCOSE 160* 121* 113* 135* 65*  BUN 26* 28* 36* 29* 29*  CREATININE 2.67* 2.89* 2.79* 2.45* 2.34*  CALCIUM 8.4* 8.1* 8.2* 8.0* 8.2*  MG  --   --   --  1.7 1.8   GFR: Estimated Creatinine Clearance: 38.4 mL/min (A) (by C-G formula based on SCr of 2.34  mg/dL (H)). Liver Function Tests: Recent Labs  Lab 03/25/20 1500 03/26/20 0403 03/30/20 0749  AST 14* 13* 15  ALT 28 21 15   ALKPHOS 83 68 60  BILITOT 0.3 0.4 0.2*  PROT 7.9 6.8 6.5  ALBUMIN 2.9* 2.6* 2.3*   No results for input(s): LIPASE, AMYLASE in the last 168 hours. No results for input(s): AMMONIA in the last 168 hours. Coagulation Profile: No results for input(s): INR, PROTIME in the last 168 hours. Cardiac Enzymes: No results for input(s): CKTOTAL, CKMB, CKMBINDEX, TROPONINI in the last 168 hours. BNP (last 3 results) No results for input(s): PROBNP in the last 8760 hours. HbA1C: No results for input(s): HGBA1C in the last 72 hours. CBG: Recent Labs  Lab 03/31/20 0104 03/31/20 0135 03/31/20 0335 03/31/20 0739 03/31/20 0806  GLUCAP 42* 90 72 64* 74   Lipid Profile: No results for input(s): CHOL, HDL, LDLCALC, TRIG, CHOLHDL, LDLDIRECT in the last 72 hours. Thyroid Function Tests: No results for input(s): TSH, T4TOTAL, FREET4, T3FREE, THYROIDAB in the last 72 hours. Anemia Panel: No results for input(s): VITAMINB12, FOLATE, FERRITIN, TIBC, IRON, RETICCTPCT in the last 72 hours. Sepsis Labs: Recent Labs  Lab 03/25/20 1500  LATICACIDVEN 1.3    Recent Results (from the past 240 hour(s))  WOUND CULTURE     Status: None   Collection Time: 03/25/20 11:00 AM   Specimen: Foot; Wound  Result Value Ref Range Status   MICRO NUMBER: [id number]  Final   SPECIMEN QUALITY: Adequate  Final    SOURCE: ULCER RIGHT FOOT  Final   STATUS: FINAL  Final   GRAM STAIN:   Final    Many White blood cells seen Rare epithelial cells Many Gram negative bacilli Many Gram positive cocci in clusters   RESULT:   Final    Growth of skin flora (note: Growth does not include S. aureus, beta-hemolytic Streptococci or P. aeruginosa).  Resp Panel by RT-PCR (Flu A&B, Covid) Nasopharyngeal Swab     Status: None   Collection Time: 03/25/20  2:24 PM   Specimen: Nasopharyngeal Swab; Nasopharyngeal(NP) swabs in vial transport medium  Result Value Ref Range Status   SARS Coronavirus 2 by RT PCR NEGATIVE NEGATIVE Final    Comment: (NOTE) SARS-CoV-2 target nucleic acids are NOT DETECTED.  The SARS-CoV-2 RNA is generally detectable in upper respiratory specimens during the acute phase of infection. The lowest concentration of SARS-CoV-2 viral copies this assay can detect is 138 copies/mL. A negative result does not preclude SARS-Cov-2 infection and should not be used as the sole basis for treatment or other patient management decisions. A negative result may occur with  improper specimen collection/handling, submission of specimen other than nasopharyngeal swab, presence of viral mutation(s) within the areas targeted by this assay, and inadequate number of viral copies(<138 copies/mL). A negative result must be combined with clinical observations, patient history, and epidemiological information. The expected result is Negative.  Fact Sheet for Patients:  EntrepreneurPulse.com.au  Fact Sheet for Healthcare Providers:  IncredibleEmployment.be  This test is no t yet approved or cleared by the [place] FDA and  has been authorized for detection and/or diagnosis of SARS-CoV-2 by FDA under an Emergency Use Authorization (EUA). This EUA will remain  in effect (meaning this test can be used) for the duration of the COVID-19 declaration under Section 564(b)(1) of the  Act, 21 U.S.C.section 360bbb-3(b)(1), unless the authorization is terminated  or revoked sooner.       Influenza A by PCR NEGATIVE NEGATIVE Final  Influenza B by PCR NEGATIVE NEGATIVE Final    Comment: (NOTE) The Xpert Xpress SARS-CoV-2/FLU/RSV plus assay is intended as an aid in the diagnosis of influenza from Nasopharyngeal swab specimens and should not be used as a sole basis for treatment. Nasal washings and aspirates are unacceptable for Xpert Xpress SARS-CoV-2/FLU/RSV testing.  Fact Sheet for Patients: EntrepreneurPulse.com.au  Fact Sheet for Healthcare Providers: IncredibleEmployment.be  This test is not yet approved or cleared by the [place] FDA and has been authorized for detection and/or diagnosis of SARS-CoV-2 by FDA under an Emergency Use Authorization (EUA). This EUA will remain in effect (meaning this test can be used) for the duration of the COVID-19 declaration under Section 564(b)(1) of the Act, 21 U.S.C. section 360bbb-3(b)(1), unless the authorization is terminated or revoked.  Performed at [facility], [place] [address], [place], [place] [address]   Culture, blood (routine x 2)     Status: None (Preliminary result)   Collection Time: 03/25/20  3:00 PM   Specimen: BLOOD  Result Value Ref Range Status   Specimen Description   Final    BLOOD LEFT ANTECUBITAL Performed at [facility] [address] [place], [place] [address]    Special Requests   Final    BOTTLES DRAWN AEROBIC AND ANAEROBIC Blood Culture adequate volume Performed at [facility] [address], [place], [place] [address]    Culture  Setup Time   Final    GRAM POSITIVE COCCI IN CLUSTERS ANAEROBIC BOTTLE ONLY Organism ID to follow CRITICAL RESULT CALLED TO, READ BACK BY AND VERIFIED WITH: C. SHADE PHARMD, AT 1055 03/30/20 BY Rush Landmark Performed at [facility], [place] [address]  [place], [place] [address]    Culture GRAM POSITIVE COCCI  Final   Report Status PENDING  Incomplete  Culture, blood (routine x 2)     Status: None   Collection Time: 03/25/20  3:00 PM   Specimen: BLOOD LEFT HAND  Result Value Ref Range Status   Specimen Description   Final    BLOOD LEFT HAND Performed at [facility] [name], [place], [place] [address]    Special Requests   Final    BOTTLES DRAWN AEROBIC AND ANAEROBIC Blood Culture adequate volume Performed at [facility] [address] [place], [place] [address]    Culture   Final    NO GROWTH 5 DAYS Performed at [facility], [place] [address], [place], [place] [address]    Report Status 03/30/2020 FINAL  Final  Blood Culture ID Panel (Reflexed)     Status: None   Collection Time: 03/25/20  3:00 PM  Result Value Ref Range Status   Enterococcus faecalis NOT DETECTED NOT DETECTED Final   Enterococcus Faecium NOT DETECTED NOT DETECTED Final   Listeria monocytogenes NOT DETECTED NOT DETECTED Final   Staphylococcus species NOT DETECTED NOT DETECTED Final   Staphylococcus aureus (BCID) NOT DETECTED NOT DETECTED Final   Staphylococcus epidermidis NOT DETECTED NOT DETECTED Final   Staphylococcus lugdunensis NOT DETECTED NOT DETECTED Final   Streptococcus species NOT DETECTED NOT DETECTED Final   Streptococcus agalactiae NOT DETECTED NOT DETECTED Final   Streptococcus pneumoniae NOT DETECTED NOT DETECTED Final   Streptococcus pyogenes NOT DETECTED NOT DETECTED Final   A.calcoaceticus-baumannii NOT DETECTED NOT DETECTED Final   Bacteroides fragilis NOT DETECTED NOT DETECTED Final   Enterobacterales NOT DETECTED NOT DETECTED Final   Enterobacter cloacae complex NOT DETECTED  NOT DETECTED Final   Escherichia coli NOT DETECTED NOT DETECTED Final   Klebsiella aerogenes NOT DETECTED NOT DETECTED Final   Klebsiella oxytoca NOT DETECTED NOT DETECTED Final   Klebsiella pneumoniae NOT DETECTED  NOT DETECTED Final   Proteus species NOT DETECTED NOT DETECTED Final   Salmonella species NOT DETECTED NOT DETECTED Final   Serratia marcescens NOT DETECTED NOT DETECTED Final   Haemophilus influenzae NOT DETECTED NOT DETECTED Final   Neisseria meningitidis NOT DETECTED NOT DETECTED Final   Pseudomonas aeruginosa NOT DETECTED NOT DETECTED Final   Stenotrophomonas maltophilia NOT DETECTED NOT DETECTED Final   Candida albicans NOT DETECTED NOT DETECTED Final   Candida auris NOT DETECTED NOT DETECTED Final   Candida glabrata NOT DETECTED NOT DETECTED Final   Candida krusei NOT DETECTED NOT DETECTED Final   Candida parapsilosis NOT DETECTED NOT DETECTED Final   Candida tropicalis NOT DETECTED NOT DETECTED Final   Cryptococcus neoformans/gattii NOT DETECTED NOT DETECTED Final    Comment: Performed at [facility], [place] [address], [place], [place] [address]  Aerobic/Anaerobic Culture (surgical/deep wound)     Status: None (Preliminary result)   Collection Time: 03/25/20  8:12 PM   Specimen: Wound; Tissue  Result Value Ref Range Status   Specimen Description   Final    WOUND Performed at [facility] [address] [place], [place] [address]    Special Requests   Final    NONE Performed at [facility], [place] [address], [place], [place] [address]    Gram Stain   Final    NO WBC SEEN NO ORGANISMS SEEN Performed at [facility], [place] [address], [place], [place] [address]    Culture   Final    RARE STAPHYLOCOCCUS LUGDUNENSIS SUSCEPTIBILITIES TO FOLLOW NO ANAEROBES ISOLATED; CULTURE IN PROGRESS FOR 5 DAYS    Report Status PENDING  Incomplete  Aerobic/Anaerobic Culture (surgical/deep wound)     Status: None (Preliminary result)   Collection Time: 03/27/20  8:30 AM   Specimen: Foot, Right; Wound  Result Value Ref Range Status   Specimen Description   Final    FOOT RIGHT 5TH METATARSAL Performed at [facility] [address], [place], [place] [address]    Special Requests   Final    NONE Performed at [facility], [place] [address], [place], [place] [address]    Gram Stain NO WBC SEEN NO ORGANISMS SEEN   Final   Culture   Final    NO GROWTH 3 DAYS Performed at [facility], [place] [address] [place], [place] [address]    Report Status PENDING  Incomplete         Radiology Studies: No results found.      Scheduled Meds: . (feeding supplement) PROSource Plus  30 mL Oral BID BM  . amLODipine  10 mg Oral Daily  . aspirin EC  81 mg Oral Daily  . atorvastatin  20 mg Oral Daily  . B-complex with vitamin C  1 tablet Oral Daily  . buPROPion  150 mg Oral Daily  . carvedilol  25 mg Oral BID  . Chlorhexidine Gluconate Cloth  6 each Topical UD  . cloNIDine  0.1 mg Oral BID  . dextrose      . escitalopram  10 mg Oral QHS  . ferrous sulfate  325 mg Oral Daily  . gabapentin  300 mg Oral TID  . heparin  5,000 Units  Subcutaneous Q8H  . insulin aspart  0-9 Units Subcutaneous Q4H  . insulin glargine  15 Units Subcutaneous BID  . metroNIDAZOLE  500 mg Oral Q8H  . nicotine  7 mg Transdermal Daily  . nutrition supplement (JUVEN)  1 packet Oral BID BM   Continuous Infusions: . cefTRIAXone (ROCEPHIN)  IV 2 g (03/30/20 1824)  . methocarbamol (ROBAXIN) IV    . vancomycin 1,000 mg (03/29/20 1829)          Aline August, MD [facility] 03/31/2020, 8:21 AM

## 2020-03-31 NOTE — Progress Notes (Signed)
Subjective: [name] is a 50 y.o. male patient seen at bedside, resting comfortably in no acute distress s/p day #4 S/p TMA with Rotation flap, residual lateral wound, right foot performed by Dr. March Rummage. Patient denies pain at surgical site, denies calf pain, denies headache, chest pain, shortness of breath, nausea, vomitting, denies loss of appetite, reports that he is ready to go home and does not understand why he is still in hospital. No other issues noted.   Patient Active Problem List   Diagnosis Date Noted  . Wound infection 03/25/2020  . Gas gangrene of foot ([place])   . Type 2 diabetes mellitus with diabetic polyneuropathy, with long-term current use of insulin ([place]) 07/16/2019  . Type 2 diabetes mellitus with stage 3a chronic kidney disease, with long-term current use of insulin ([place]) 07/16/2019  . Diabetic foot ulcer ([place]) 06/29/2019  . CRI (chronic renal insufficiency), stage 3 (moderate) ([place]) 01/24/2019  . Obesity 01/24/2019  . 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[name]) 11/13/2018  . Diabetic foot ([place]) 10/17/2018  . Non-pressure chronic ulcer of other part of right foot limited to breakdown of skin ([place]) 09/11/2018  . Type 2 diabetes mellitus with proliferative retinopathy of both eyes, without long-term current use of insulin ([place]) 07/14/2018  . Diabetic infection of left foot ([place]) 07/14/2018  . Wound cellulitis 05/22/2018  . Osteomyelitis of foot, acute ([place]) 05/21/2018  . 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 ([place])   . Diabetic ulcer of left midfoot associated with diabetes mellitus due to underlying condition, with fat layer exposed ([place]) 08/26/2017  . B12 deficiency 06/15/2017  . Vitamin D deficiency 06/03/2017  . Iron deficiency anemia 06/01/2017  . Diabetic foot infection ([place])   . Osteomyelitis ([place]) 05/25/2017  . Vitreous floaters of right eye 09/15/2016  . Non compliance w medication regimen 09/15/2016  . Depression 09/01/2016  . Foot ulcer due to  secondary DM ([place]) 08/20/2016  . Hidradenitis suppurativa 10/13/2015  . Peripheral polyneuropathy 07/07/2015  . Coronary atherosclerosis of native coronary artery 08/17/2013  . Diabetic retinopathy ([place]) 02/02/2013  . Diabetic neuropathy ([place]) 12/01/2012  . Hypertension associated with diabetes ([place]) 08/03/2012  . Tobacco use disorder 08/03/2012  . Erectile dysfunction 08/03/2012  . Diabetes mellitus with neurological manifestations, uncontrolled ([place]) 05/26/2006     Current Facility-Administered Medications:  .  (feeding supplement) PROSource Plus liquid 30 mL, 30 mL, Oral, BID BM, Darrick Meigs, Gagan S, MD, 30 mL at 03/30/20 2119 .  acetaminophen (TYLENOL) tablet 650 mg, 650 mg, Oral, Q6H PRN, 650 mg at 03/26/20 1014 **OR** acetaminophen (TYLENOL) suppository 650 mg, 650 mg, Rectal, Q6H PRN, [name], DPM .  amLODipine (NORVASC) tablet 10 mg, 10 mg, Oral, Daily, [name], DPM, 10 mg at 03/31/20 0950 .  aspirin EC tablet 81 mg, 81 mg, Oral, Daily, [name], DPM, 81 mg at 03/31/20 0950 .  atorvastatin (LIPITOR) tablet 20 mg, 20 mg, Oral, Daily, [name], DPM, 20 mg at 03/31/20 0951 .  B-complex with vitamin C tablet 1 tablet, 1 tablet, Oral, Daily, [name], DPM, 1 tablet at 03/31/20 0947 .  buPROPion (WELLBUTRIN SR) 12 hr tablet 150 mg, 150 mg, Oral, Daily, [name], DPM, 150 mg at 03/31/20 0951 .  carvedilol (COREG) tablet 25 mg, 25 mg, Oral, BID, [name], DPM, 25 mg at 03/31/20 0950 .  cefTRIAXone (ROCEPHIN) 2 g in sodium chloride 0.9 % 100 mL IVPB, 2 g, Intravenous, Q24H, Price, Christian Mate, DPM, Last  Rate: 200 mL/hr at 03/30/20 1824, 2 g at 03/30/20 1824 .  Chlorhexidine Gluconate Cloth 2 % PADS 6 each, 6 each, Topical, UD, [name], DPM, 6 each at 03/26/20 2144 .  cloNIDine (CATAPRES) tablet 0.1 mg, 0.1 mg, Oral, BID, [name], DPM, 0.1 mg at 03/31/20 0949 .  escitalopram (LEXAPRO) tablet 10 mg, 10 mg, Oral, QHS, [name], DPM, 10 mg at 03/30/20 2117 .  ferrous sulfate tablet 325 mg, 325 mg, Oral, Daily, [name], DPM, 325 mg at 03/31/20 0949 .  gabapentin (NEURONTIN) capsule 300 mg, 300 mg, Oral, TID, [name], DPM, 300 mg at 03/31/20 0951 .  heparin injection 5,000 Units, 5,000 Units, Subcutaneous, Q8H, [name], DPM, 5,000 Units at 03/31/20 0507 .  hydrALAZINE (APRESOLINE) injection 10 mg, 10 mg, Intravenous, Q4H PRN, [name], DPM, 10 mg at 03/25/20 1811 .  HYDROmorphone (DILAUDID) injection 0.5 mg, 0.5 mg, Intravenous, Q2H PRN, [name], DPM, 0.5 mg at 03/28/20 1010 .  insulin aspart (novoLOG) injection 0-9 Units, 0-9 Units, Subcutaneous, Q4H, [name], DPM, 1 Units at 03/30/20 1221 .  insulin glargine (LANTUS) injection 7 Units, 7 Units, Subcutaneous, BID, Starla Link, Kshitiz, MD, 7 Units at 03/31/20 1237 .  melatonin tablet 5 mg, 5 mg, Oral, QHS PRN, Blount, Xenia T, NP, 5 mg at 03/29/20 2158 .  methocarbamol (ROBAXIN) 500 mg in dextrose 5 % 50 mL IVPB, 500 mg, Intravenous, Q6H PRN, [name], DPM .  metroNIDAZOLE (FLAGYL) tablet 500 mg, 500 mg, Oral, Q8H, [name], DPM, 500 mg at 03/31/20 0507 .  nicotine (NICODERM CQ - dosed in mg/24 hr) patch 7 mg, 7 mg, Transdermal, Daily, [name], DPM, 7 mg at 03/31/20 9629 .  nutrition supplement (JUVEN) (JUVEN) powder packet 1 packet, 1 packet, Oral, BID BM, Oswald Hillock, MD, 1 packet at 03/30/20 1552 .  ondansetron (ZOFRAN) tablet 4 mg, 4 mg, Oral, Q6H PRN **OR** ondansetron (ZOFRAN) injection 4 mg, 4 mg, Intravenous, Q6H PRN, [name], DPM, 4 mg at 03/28/20 1028 .  oxyCODONE (Oxy IR/ROXICODONE) immediate release tablet 5 mg, 5 mg, Oral, Q4H PRN, [name], DPM, 5 mg at 03/29/20 0917 .  prochlorperazine (COMPAZINE) injection 10 mg, 10 mg, Intravenous, Q6H PRN, Lang Snow, FNP, 10 mg at 03/28/20 1534 .  vancomycin (VANCOCIN) IVPB 1000 mg/200 mL premix, 1,000 mg, Intravenous,  Q36H, [name], RPH, Last Rate: 200 mL/hr at 03/31/20 1002, 1,000 mg at 03/31/20 1002  Allergies  Allergen Reactions  . Bee Venom Swelling    SWELLING REACTION UNSPECIFIED   . Penicillins Hives and Other (See Comments)    Full body hives as a child with no shortness of breath or swelling  **Can tolerate cephalosporins**  . Clonidine Derivatives     Pt states "It is making me dizzy"     Objective: Today's Vitals   03/30/20 2000 03/30/20 2141 03/31/20 0541 03/31/20 1304  BP:  (!) 142/75 (!) 141/76 135/77  Pulse:  82 82 79  Resp:  18 18 16   Temp:  98.2 F (36.8 C) 98.2 F (36.8 C) 98.6 F (37 C)  TempSrc:  Oral Oral Oral  SpO2:  99% 100%   Weight:      Height:      PainSc: 3        General: No acute distress  Right Lower extremity: Dressing to left foot clean, dry, intact. No strikethrough noted, Upon  removal of dressings staples intact with no dehiscence and wound vac intact to dorsolateral foot. CFT to TMA flaps intact, No significant erythema, minimal edema, mild bloodly drainage in canister approximately 60cc. No other acute signs of infection. No calf pain. Range of motion excluding surgical site within normal limits with no pain or crepitation.    Results for orders placed or performed during the hospital encounter of 03/25/20  Resp Panel by RT-PCR (Flu A&B, Covid) Nasopharyngeal Swab     Status: None   Collection Time: 03/25/20  2:24 PM   Specimen: Nasopharyngeal Swab; Nasopharyngeal(NP) swabs in vial transport medium  Result Value Ref Range Status   SARS Coronavirus 2 by RT PCR NEGATIVE NEGATIVE Final    Comment: (NOTE) SARS-CoV-2 target nucleic acids are NOT DETECTED.  The SARS-CoV-2 RNA is generally detectable in upper respiratory specimens during the acute phase of infection. The lowest concentration of SARS-CoV-2 viral copies this assay can detect is 138 copies/mL. A negative result does not preclude SARS-Cov-2 infection and should not be used as the  sole basis for treatment or other patient management decisions. A negative result may occur with  improper specimen collection/handling, submission of specimen other than nasopharyngeal swab, presence of viral mutation(s) within the areas targeted by this assay, and inadequate number of viral copies(<138 copies/mL). A negative result must be combined with clinical observations, patient history, and epidemiological information. The expected result is Negative.  Fact Sheet for Patients:  EntrepreneurPulse.com.au  Fact Sheet for Healthcare Providers:  IncredibleEmployment.be  This test is no t yet approved or cleared by the [place] FDA and  has been authorized for detection and/or diagnosis of SARS-CoV-2 by FDA under an Emergency Use Authorization (EUA). This EUA will remain  in effect (meaning this test can be used) for the duration of the COVID-19 declaration under Section 564(b)(1) of the Act, 21 U.S.C.section 360bbb-3(b)(1), unless the authorization is terminated  or revoked sooner.       Influenza A by PCR NEGATIVE NEGATIVE Final   Influenza B by PCR NEGATIVE NEGATIVE Final    Comment: (NOTE) The Xpert Xpress SARS-CoV-2/FLU/RSV plus assay is intended as an aid in the diagnosis of influenza from Nasopharyngeal swab specimens and should not be used as a sole basis for treatment. Nasal washings and aspirates are unacceptable for Xpert Xpress SARS-CoV-2/FLU/RSV testing.  Fact Sheet for Patients: EntrepreneurPulse.com.au  Fact Sheet for Healthcare Providers: IncredibleEmployment.be  This test is not yet approved or cleared by the [place] FDA and has been authorized for detection and/or diagnosis of SARS-CoV-2 by FDA under an Emergency Use Authorization (EUA). This EUA will remain in effect (meaning this test can be used) for the duration of the COVID-19 declaration under Section 564(b)(1) of the  Act, 21 U.S.C. section 360bbb-3(b)(1), unless the authorization is terminated or revoked.  Performed at [facility], [place] [address] [place], [place] [address]   Culture, blood (routine x 2)     Status: None (Preliminary result)   Collection Time: 03/25/20  3:00 PM   Specimen: BLOOD  Result Value Ref Range Status   Specimen Description   Final    BLOOD LEFT ANTECUBITAL Performed at [facility] [address] [place], [place] [address]    Special Requests   Final    BOTTLES DRAWN AEROBIC AND ANAEROBIC Blood Culture adequate volume Performed at [facility] [address], [place], [place] [address]    Culture  Setup Time   Final  GRAM POSITIVE COCCI IN CLUSTERS ANAEROBIC BOTTLE ONLY Organism ID to follow CRITICAL RESULT CALLED TO, READ BACK BY AND VERIFIED WITH: C. SHADE PHARMD, AT 1055 03/30/20 BY Rush Landmark Performed at [facility], [place] [address], [place], [place] [address]    Culture GRAM POSITIVE COCCI  Final   Report Status PENDING  Incomplete  Culture, blood (routine x 2)     Status: None   Collection Time: 03/25/20  3:00 PM   Specimen: BLOOD LEFT HAND  Result Value Ref Range Status   Specimen Description   Final    BLOOD LEFT HAND Performed at [facility] [address], [place], [place] [address]    Special Requests   Final    BOTTLES DRAWN AEROBIC AND ANAEROBIC Blood Culture adequate volume Performed at [facility] [address] [place], [place] [address]    Culture   Final    NO GROWTH 5 DAYS Performed at [facility], [place] [address] [place], [place] [address]    Report Status 03/30/2020 FINAL  Final  Blood Culture ID Panel (Reflexed)     Status: None   Collection Time: 03/25/20  3:00 PM  Result Value Ref Range Status   Enterococcus faecalis NOT DETECTED NOT DETECTED Final   Enterococcus Faecium NOT DETECTED NOT DETECTED Final   Listeria  monocytogenes NOT DETECTED NOT DETECTED Final   Staphylococcus species NOT DETECTED NOT DETECTED Final   Staphylococcus aureus (BCID) NOT DETECTED NOT DETECTED Final   Staphylococcus epidermidis NOT DETECTED NOT DETECTED Final   Staphylococcus lugdunensis NOT DETECTED NOT DETECTED Final   Streptococcus species NOT DETECTED NOT DETECTED Final   Streptococcus agalactiae NOT DETECTED NOT DETECTED Final   Streptococcus pneumoniae NOT DETECTED NOT DETECTED Final   Streptococcus pyogenes NOT DETECTED NOT DETECTED Final   A.calcoaceticus-baumannii NOT DETECTED NOT DETECTED Final   Bacteroides fragilis NOT DETECTED NOT DETECTED Final   Enterobacterales NOT DETECTED NOT DETECTED Final   Enterobacter cloacae complex NOT DETECTED NOT DETECTED Final   Escherichia coli NOT DETECTED NOT DETECTED Final   Klebsiella aerogenes NOT DETECTED NOT DETECTED Final   Klebsiella oxytoca NOT DETECTED NOT DETECTED Final   Klebsiella pneumoniae NOT DETECTED NOT DETECTED Final   Proteus species NOT DETECTED NOT DETECTED Final   Salmonella species NOT DETECTED NOT DETECTED Final   Serratia marcescens NOT DETECTED NOT DETECTED Final   Haemophilus influenzae NOT DETECTED NOT DETECTED Final   Neisseria meningitidis NOT DETECTED NOT DETECTED Final   Pseudomonas aeruginosa NOT DETECTED NOT DETECTED Final   Stenotrophomonas maltophilia NOT DETECTED NOT DETECTED Final   Candida albicans NOT DETECTED NOT DETECTED Final   Candida auris NOT DETECTED NOT DETECTED Final   Candida glabrata NOT DETECTED NOT DETECTED Final   Candida krusei NOT DETECTED NOT DETECTED Final   Candida parapsilosis NOT DETECTED NOT DETECTED Final   Candida tropicalis NOT DETECTED NOT DETECTED Final   Cryptococcus neoformans/gattii NOT DETECTED NOT DETECTED Final    Comment: Performed at [facility], [address], [place], [place] [address]  Aerobic/Anaerobic Culture (surgical/deep wound)     Status: None (Preliminary result)   Collection  Time: 03/25/20  8:12 PM   Specimen: Wound; Tissue  Result Value Ref Range Status   Specimen Description   Final    WOUND Performed at [facility] [address] [place], [place] [address]    Special Requests   Final    NONE Performed at [facility]  [place] [address] [place], [place] [address]    Gram Stain   Final    NO WBC SEEN NO ORGANISMS SEEN Performed at [facility], [place] [address], [place], [place] [address]    Culture   Final    RARE STAPHYLOCOCCUS LUGDUNENSIS SUSCEPTIBILITIES TO FOLLOW MIXED ANAEROBIC FLORA PRESENT.  CALL LAB IF FURTHER IID REQUIRED.    Report Status PENDING  Incomplete  Aerobic/Anaerobic Culture (surgical/deep wound)     Status: None (Preliminary result)   Collection Time: 03/27/20  8:30 AM   Specimen: Foot, Right; Wound  Result Value Ref Range Status   Specimen Description   Final    FOOT RIGHT 5TH METATARSAL Performed at [facility] [address], [place], [place] [address]    Special Requests   Final    NONE Performed at [facility], [place] [address] [place], [place] [address]    Gram Stain NO WBC SEEN NO ORGANISMS SEEN   Final   Culture   Final    NO GROWTH 4 DAYS Performed at [facility], [place] [address] [place], [place] [address]    Report Status PENDING  Incomplete  Culture, blood (routine x 2)     Status: None (Preliminary result)   Collection Time: 03/31/20  4:54 AM   Specimen: BLOOD  Result Value Ref Range Status   Specimen Description   Final    BLOOD FOREARM Performed at [facility] [address], [place], [place] [address]    Special Requests   Final    BOTTLES DRAWN AEROBIC ONLY Blood Culture adequate volume Performed at [facility] [facility] [address], [place], [place] [address]    Culture   Final    NO GROWTH < 12 HOURS Performed at [facility] [address], [name], [place]  [address]    Report Status PENDING  Incomplete  Culture, blood (routine x 2)     Status: None (Preliminary result)   Collection Time: 03/31/20  4:54 AM   Specimen: BLOOD LEFT HAND  Result Value Ref Range Status   Specimen Description   Final    BLOOD LEFT HAND Performed at [facility] [address], [place], [place] [address]    Special Requests   Final    BOTTLES DRAWN AEROBIC ONLY Blood Culture adequate volume Performed at [facility] [address], [place], [place] [address]    Culture   Final    NO GROWTH < 12 HOURS Performed at [facility] [facility] [address] [place], [place] [address]    Report Status PENDING  Incomplete      Assessment and Plan:  Problem List Items Addressed This Visit   None   Visit Diagnoses    Acute osteomyelitis of right foot ([place])    -  Primary   Relevant Medications   ceFEPIme (MAXIPIME) 2 g in sodium chloride 0.9 % 100 mL IVPB (Completed)   metroNIDAZOLE (FLAGYL) tablet 500 mg (Completed)   vancomycin (VANCOREADY) IVPB 1500 mg/300 mL (Completed)   cefTRIAXone (ROCEPHIN) 2 g in sodium chloride 0.9 % 100 mL IVPB   metroNIDAZOLE (FLAGYL) tablet 500 mg   clindamycin (CLEOCIN) IVPB 900 mg (Completed)   vancomycin (VANCOCIN) IVPB 1000 mg/200 mL premix   Post-operative state       Relevant Orders   DG Foot 2 Views Right (Completed)   DG Foot 2 Views Right (  Completed)   Diabetes 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  -At this time there are no additional podiatric procedures planned; patient is stable from podiatry point of view -Recommend continue with antibiotics and follow up cultures due to continued elevated wbc  -Weightbearing to heel with CAM boot on right -Continue PT/OT -Continue with rest and elevation to assist with pain and edema control  -Anticipated discharge plan of care: To home with antibiotics based on culture results with home nursing. Patient after discharge to follow up in office with Dr. March Rummage within 1 week.   Dr. Landis Martins, DPM [facility] [phone] office [phone] cell

## 2020-03-31 NOTE — Progress Notes (Signed)
Physical Therapy working with pt. He c/o "dizziness". VSS. CBG checked and was 56. Hypoglycemia protocol followed. Patient's appetite is poor and nursing staff continues to encourage pt to eat and drink at least every 1-2 hrs. Continue to monitor. Eulas Post, RN

## 2020-03-31 NOTE — Progress Notes (Signed)
Pharmacy Penicillin Allergy Assessment   History of B-Lactam Allergy: I discussed [name]'s penicillin allergy history with him this afternoon. He recalls that he had full body hives as a child with no swelling or shortness of breath. He has not taken any penicillin since that time. When I asked about specific antibiotics he also recalled having hives with amoxicillin but could not remember exactly when this was.  [name] reports that he does tolerate cephalosporins including cefdinir and cephalexin.   [name] did not wish to re-try penicillins at this time.   [name], PharmD, BCPS, BCIDP Infectious Diseases Clinical Pharmacist Phone: [phone] 03/31/2020 1:17 PM

## 2020-03-31 NOTE — Progress Notes (Signed)
  Echocardiogram 2D Echocardiogram has been performed.  Bobbye [place] 03/31/2020, 11:53 AM

## 2020-03-31 NOTE — Progress Notes (Signed)
Physical Therapy Treatment Patient Details Name: [name] MRN: [id number] DOB: 05-May-1970 Today's Date: 03/31/2020    History of Present Illness 50 year old male with past medical history of diabetes mellitus type 2, prior history of amputation L foot (great toe amp), hypertension, CKD stage III presented with worsening right foot. He had foul-smelling discharge coming from the R foot wound.  He was seen by podiatry and is now s/p TMA R  foot with wound vac placement    PT Comments    Pt reports being tired but agreeable for therapy.  Pt reports feeling very unsteady and also required assist for stability with standing and attempts for marching.  Pt reported dizziness with standing and requested return to bed as dizziness worsened.  Vitals obtained.  Also requested nurse tech check blood sugar and glucose 56, RN notified.  Continue to recommend SNF.   03/31/20 1417  Vital Signs  Pulse Rate 77  Pulse Rate Source Monitor  BP (!) 142/82  BP Location Right Arm  BP Method Automatic  Patient Position (if appropriate) Lying  Oxygen Therapy  SpO2 98 %  O2 Device Room Air     Follow Up Recommendations  SNF     Equipment Recommendations  None recommended by PT    Recommendations for Other Services       Precautions / Restrictions Precautions Precautions: Fall Precaution Comments: NPWT R foot Required Braces or Orthoses: Other Brace Other Brace: cam boot on right Restrictions Other Position/Activity Restrictions: WBAT in [place]    Mobility  Bed Mobility Overal bed mobility: Needs Assistance Bed Mobility: Supine to Sit;Sit to Supine     Supine to sit: Supervision Sit to supine: Min assist   General bed mobility comments: pt requested assist to don CAM boot stating he could not perform on his own, assist for return to bed due to dizziness  Transfers Overall transfer level: Needs assistance Equipment used: Rolling walker (2 wheeled) Transfers: Sit to/from  Stand Sit to Stand: Mod assist;Min assist         General transfer comment: verbal cues for hand placement and weight shifting, mod assist initially for rise and stability however improved to min assist with second performance; pt with dizziness upon standing and requested return to bed, vitals obtained and RN notified, pt feels blood sugar may be low  Ambulation/Gait                 Stairs             Wheelchair Mobility    Modified Rankin (Stroke Patients Only)       Balance Overall balance assessment: Needs assistance         Standing balance support: Bilateral upper extremity supported Standing balance-Leahy Scale: Poor Standing balance comment: reliant on UEs and external assist                            Cognition Arousal/Alertness: Awake/alert Behavior During Therapy: WFL for tasks assessed/performed Overall Cognitive Status: Within Functional Limits for tasks assessed                                        Exercises      General Comments        Pertinent Vitals/Pain Pain Assessment: No/denies pain    Home Living  Prior Function            PT Goals (current goals can now be found in the care plan section) Progress towards PT goals: Progressing toward goals    Frequency    Min 3X/week      PT Plan Current plan remains appropriate    Co-evaluation              AM-PAC PT "6 Clicks" Mobility   Outcome Measure  Help needed turning from your back to your side while in a flat bed without using bedrails?: A Little Help needed moving from lying on your back to sitting on the side of a flat bed without using bedrails?: A Little Help needed moving to and from a bed to a chair (including a wheelchair)?: A Lot Help needed standing up from a chair using your arms (e.g., wheelchair or bedside chair)?: A Lot Help needed to walk in hospital room?: A Lot Help needed climbing  3-5 steps with a railing? : Total 6 Click Score: 13    End of Session Equipment Utilized During Treatment: Gait belt Activity Tolerance: Other (comment) (limited by low glucose level) Patient left: with call bell/phone within reach;in bed;with bed alarm set Nurse Communication: Mobility status PT Visit Diagnosis: Other abnormalities of gait and mobility (R26.89)     Time: 6045-4098 PT Time Calculation (min) (ACUTE ONLY): 18 min  Charges:  $Therapeutic Activity: 8-22 mins                    Jannette Spanner PT, DPT Acute Rehabilitation Services Pager: [phone] Office: [phone]  Aceson Labell,KATHrine E 03/31/2020, 4:06 PM

## 2020-03-31 NOTE — Progress Notes (Signed)
Pharmacy Antibiotic Note  [name] is a 50 y.o. male admitted on 03/25/2020 with diabetic foot infection.  Pharmacy has been consulted for Vancomycin dosing.  Day #7 abx - Afebrile - WBC 24.3, unchanged - SCr 2.34, slightly improved - CRP 8.9 - Repeat blood cultures pending  Plan: Adjust vancomycin to 1g IV Q36h (estimated AUC 479 using SCr 2.34) Ceftriaxone + Flagyl per MD Monitor renal function F/U cx results, LOT   Height: 5\' 4"  (162.6 cm) Weight: 90.7 kg (200 lb) IBW/kg (Calculated) : 59.2  Temp (24hrs), Avg:98 F (36.7 C), Min:97.7 F (36.5 C), Max:98.2 F (36.8 C)  Recent Labs  Lab 03/25/20 1500 03/26/20 0403 03/27/20 0426 03/28/20 0305 03/29/20 0443 03/30/20 0749 03/31/20 0454  WBC 17.8* 16.6* 14.0* 15.4* 23.6* 26.0* 24.3*  CREATININE 2.58* 2.67* 2.89*  --  2.79* 2.45* 2.34*  LATICACIDVEN 1.3  --   --   --   --   --   --     Estimated Creatinine Clearance: 38.4 mL/min (A) (by C-G formula based on SCr of 2.34 mg/dL (H)).    Allergies  Allergen Reactions  . Bee Venom Swelling    SWELLING REACTION UNSPECIFIED   . Penicillins Hives and Other (See Comments)    PATIENT HAS HAD A PCN REACTION WITH IMMEDIATE RASH, FACIAL/TONGUE/THROAT SWELLING, SOB, OR LIGHTHEADEDNESS WITH HYPOTENSION:  **Can tolerate cephalosporins**  . Clonidine Derivatives     Pt states "It is making me dizzy"   Antimicrobials this admission:  PTA Cipro/Bactrim 12/22 >>12/28 12/28 Vanc >> 12/28 Cefepime x1 12/28 metronidazole >>  12/28 Ceftriaxone >>  12/30 clinda x 1  Dose adjustments this admission:   Microbiology results:  12/22 Wound (no site specified):  Proteus mirabilis (pan-sens except amp, cefazolin) 12/28 BCx: 1/4 GPC, nothing on BCID 12/28 tissue from wound: rare staph lugdunensis 12/30 right foot 5th metatarsal wound Cx: ngtd 1/3 BCx:  Thank you for allowing pharmacy to be a part of this patient's care.  Peggyann Juba, PharmD, BCPS Pharmacy:  [phone] 03/31/2020 9:11 AM

## 2020-03-31 NOTE — Progress Notes (Signed)
Hospitalist on call Jeannette Corpus) notified via Amion of patient's recent hypoglycemia and treatment measures to increase blood glucose. Patient asymptomatic and appears pleasant and fully oriented. Patient denies dizziness. Glucose at recheck is 90. Extra orange juice provided. Will recheck bedside glucose for 0400 coverage as appropriate.

## 2020-04-01 DIAGNOSIS — A480 Gas gangrene: Secondary | ICD-10-CM | POA: Diagnosis not present

## 2020-04-01 DIAGNOSIS — T148XXA Other injury of unspecified body region, initial encounter: Secondary | ICD-10-CM | POA: Diagnosis not present

## 2020-04-01 DIAGNOSIS — N184 Chronic kidney disease, stage 4 (severe): Secondary | ICD-10-CM | POA: Diagnosis not present

## 2020-04-01 DIAGNOSIS — R7881 Bacteremia: Secondary | ICD-10-CM | POA: Diagnosis not present

## 2020-04-01 LAB — GLUCOSE, CAPILLARY
Glucose-Capillary: 101 mg/dL — ABNORMAL HIGH (ref 70–99)
Glucose-Capillary: 106 mg/dL — ABNORMAL HIGH (ref 70–99)
Glucose-Capillary: 118 mg/dL — ABNORMAL HIGH (ref 70–99)
Glucose-Capillary: 144 mg/dL — ABNORMAL HIGH (ref 70–99)
Glucose-Capillary: 81 mg/dL (ref 70–99)
Glucose-Capillary: 84 mg/dL (ref 70–99)
Glucose-Capillary: 92 mg/dL (ref 70–99)

## 2020-04-01 LAB — AEROBIC/ANAEROBIC CULTURE W GRAM STAIN (SURGICAL/DEEP WOUND)
Culture: NO GROWTH
Gram Stain: NONE SEEN
Gram Stain: NONE SEEN

## 2020-04-01 LAB — CBC WITH DIFFERENTIAL/PLATELET
Abs Immature Granulocytes: 0.16 10*3/uL — ABNORMAL HIGH (ref 0.00–0.07)
Basophils Absolute: 0 10*3/uL (ref 0.0–0.1)
Basophils Relative: 0 %
Eosinophils Absolute: 0.5 10*3/uL (ref 0.0–0.5)
Eosinophils Relative: 2 %
HCT: 26.9 % — ABNORMAL LOW (ref 39.0–52.0)
Hemoglobin: 8.2 g/dL — ABNORMAL LOW (ref 13.0–17.0)
Immature Granulocytes: 1 %
Lymphocytes Relative: 13 %
Lymphs Abs: 2.8 10*3/uL (ref 0.7–4.0)
MCH: 27.5 pg (ref 26.0–34.0)
MCHC: 30.5 g/dL (ref 30.0–36.0)
MCV: 90.3 fL (ref 80.0–100.0)
Monocytes Absolute: 0.9 10*3/uL (ref 0.1–1.0)
Monocytes Relative: 5 %
Neutro Abs: 16.7 10*3/uL — ABNORMAL HIGH (ref 1.7–7.7)
Neutrophils Relative %: 79 %
Platelets: 453 10*3/uL — ABNORMAL HIGH (ref 150–400)
RBC: 2.98 MIL/uL — ABNORMAL LOW (ref 4.22–5.81)
RDW: 15.3 % (ref 11.5–15.5)
WBC: 21.1 10*3/uL — ABNORMAL HIGH (ref 4.0–10.5)
nRBC: 0 % (ref 0.0–0.2)

## 2020-04-01 LAB — BASIC METABOLIC PANEL
Anion gap: 8 (ref 5–15)
BUN: 23 mg/dL — ABNORMAL HIGH (ref 6–20)
CO2: 18 mmol/L — ABNORMAL LOW (ref 22–32)
Calcium: 8 mg/dL — ABNORMAL LOW (ref 8.9–10.3)
Chloride: 112 mmol/L — ABNORMAL HIGH (ref 98–111)
Creatinine, Ser: 2.15 mg/dL — ABNORMAL HIGH (ref 0.61–1.24)
GFR, Estimated: 37 mL/min — ABNORMAL LOW (ref >60)
Glucose, Bld: 140 mg/dL — ABNORMAL HIGH (ref 70–99)
Potassium: 3.5 mmol/L (ref 3.5–5.1)
Sodium: 138 mmol/L (ref 135–145)

## 2020-04-01 LAB — MAGNESIUM: Magnesium: 1.9 mg/dL (ref 1.7–2.4)

## 2020-04-01 LAB — C-REACTIVE PROTEIN: CRP: 6.6 mg/dL — ABNORMAL HIGH (ref <1.0)

## 2020-04-01 NOTE — Plan of Care (Signed)
  Problem: Education: Goal: Knowledge of General Education information will improve Description: Including pain rating scale, medication(s)/side effects and non-pharmacologic comfort measures Outcome: Progressing   Problem: Clinical Measurements: Goal: Will remain free from infection Outcome: Progressing   Problem: Activity: Goal: Risk for activity intolerance will decrease Outcome: Progressing   Problem: Nutrition: Goal: Adequate nutrition will be maintained Outcome: Progressing   Problem: Coping: Goal: Level of anxiety will decrease Outcome: Progressing   Problem: Pain Managment: Goal: General experience of comfort will improve Outcome: Progressing   Problem: Safety: Goal: Ability to remain free from injury will improve Outcome: Progressing   Problem: Skin Integrity: Goal: Risk for impaired skin integrity will decrease Outcome: Progressing   

## 2020-04-01 NOTE — Progress Notes (Signed)
Subjective: [name] is a 50 y.o. male patient seen at bedside, resting comfortably in no acute distress s/p day #4 S/p TMA with Rotation flap, residual lateral wound, right foot. Pain is controlled right foot but he is having issues with balance. PT has recommended D/c to SNF.  Patient Active Problem List   Diagnosis Date Noted  . Wound infection 03/25/2020  . Gas gangrene of foot ([place])   . Type 2 diabetes mellitus with diabetic polyneuropathy, with long-term current use of insulin ([place]) 07/16/2019  . Type 2 diabetes mellitus with stage 3a chronic kidney disease, with long-term current use of insulin ([place]) 07/16/2019  . Diabetic foot ulcer ([place]) 06/29/2019  . CRI (chronic renal insufficiency), stage 3 (moderate) ([place]) 01/24/2019  . Obesity 01/24/2019  . 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[place]) 11/13/2018  . Diabetic foot ([place]) 10/17/2018  . Non-pressure chronic ulcer of other part of right foot limited to breakdown of skin ([place]) 09/11/2018  . Type 2 diabetes mellitus with proliferative retinopathy of both eyes, without long-term current use of insulin ([place]) 07/14/2018  . Diabetic infection of left foot ([place]) 07/14/2018  . Wound cellulitis 05/22/2018  . Osteomyelitis of foot, acute ([place]) 05/21/2018  . 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 ([place])   . Diabetic ulcer of left midfoot associated with diabetes mellitus due to underlying condition, with fat layer exposed ([place]) 08/26/2017  . B12 deficiency 06/15/2017  . Vitamin D deficiency 06/03/2017  . Iron deficiency anemia 06/01/2017  . Diabetic foot infection ([place])   . Osteomyelitis ([place]) 05/25/2017  . Vitreous floaters of right eye 09/15/2016  . Non compliance w medication regimen 09/15/2016  . Depression 09/01/2016  . Foot ulcer due to secondary DM ([place]) 08/20/2016  . Hidradenitis suppurativa 10/13/2015  . Peripheral polyneuropathy 07/07/2015  . Coronary atherosclerosis of native coronary artery 08/17/2013  . Diabetic  retinopathy ([place]) 02/02/2013  . Diabetic neuropathy ([place]) 12/01/2012  . Hypertension associated with diabetes ([place]) 08/03/2012  . Tobacco use disorder 08/03/2012  . Erectile dysfunction 08/03/2012  . Diabetes mellitus with neurological manifestations, uncontrolled ([place]) 05/26/2006     Current Facility-Administered Medications:  .  (feeding supplement) PROSource Plus liquid 30 mL, 30 mL, Oral, BID BM, Darrick Meigs, Marge Duncans, MD, 30 mL at 03/31/20 1400 .  acetaminophen (TYLENOL) tablet 650 mg, 650 mg, Oral, Q6H PRN, 650 mg at 03/26/20 1014 **OR** acetaminophen (TYLENOL) suppository 650 mg, 650 mg, Rectal, Q6H PRN, [name], DPM .  amLODipine (NORVASC) tablet 10 mg, 10 mg, Oral, Daily, [name], DPM, 10 mg at 04/01/20 1200 .  aspirin EC tablet 81 mg, 81 mg, Oral, Daily, [name], DPM, 81 mg at 04/01/20 1159 .  atorvastatin (LIPITOR) tablet 20 mg, 20 mg, Oral, Daily, [name], DPM, 20 mg at 04/01/20 1202 .  B-complex with vitamin C tablet 1 tablet, 1 tablet, Oral, Daily, [name], DPM, 1 tablet at 04/01/20 1202 .  buPROPion (WELLBUTRIN SR) 12 hr tablet 150 mg, 150 mg, Oral, Daily, [name], DPM, 150 mg at 04/01/20 1202 .  carvedilol (COREG) tablet 25 mg, 25 mg, Oral, BID, [name], DPM, 25 mg at 04/01/20 1201 .  cefTRIAXone (ROCEPHIN) 2 g in sodium chloride 0.9 % 100 mL IVPB, 2 g, Intravenous, Q24H, Daishaun Ayre, Christian Mate, DPM, Last Rate: 200 mL/hr at 04/01/20 1707, 2 g at 04/01/20 1707 .  Chlorhexidine Gluconate Cloth 2 % PADS 6 each, 6 each, Topical, UD, [name], DPM, 6 each  at 03/26/20 2144 .  cloNIDine (CATAPRES) tablet 0.1 mg, 0.1 mg, Oral, BID, [name], DPM, 0.1 mg at 04/01/20 1200 .  escitalopram (LEXAPRO) tablet 10 mg, 10 mg, Oral, QHS, [name], DPM, 10 mg at 03/31/20 2141 .  ferrous sulfate tablet 325 mg, 325 mg, Oral, Daily, [name], DPM, 325 mg at 04/01/20 1159 .  gabapentin (NEURONTIN) capsule 300 mg, 300 mg,  Oral, TID, [name], DPM, 300 mg at 04/01/20 1626 .  heparin injection 5,000 Units, 5,000 Units, Subcutaneous, Q8H, [name], DPM, 5,000 Units at 04/01/20 1455 .  hydrALAZINE (APRESOLINE) injection 10 mg, 10 mg, Intravenous, Q4H PRN, [name], DPM, 10 mg at 03/25/20 1811 .  HYDROmorphone (DILAUDID) injection 0.5 mg, 0.5 mg, Intravenous, Q2H PRN, [name], DPM, 0.5 mg at 03/28/20 1010 .  insulin aspart (novoLOG) injection 0-9 Units, 0-9 Units, Subcutaneous, Q4H, [name], DPM, 1 Units at 03/30/20 1221 .  insulin glargine (LANTUS) injection 5 Units, 5 Units, Subcutaneous, Daily, Alekh, Kshitiz, MD .  melatonin tablet 5 mg, 5 mg, Oral, QHS PRN, Blount, Xenia T, NP, 5 mg at 03/29/20 2158 .  methocarbamol (ROBAXIN) 500 mg in dextrose 5 % 50 mL IVPB, 500 mg, Intravenous, Q6H PRN, [name], DPM .  metroNIDAZOLE (FLAGYL) tablet 500 mg, 500 mg, Oral, Q8H, [name], DPM, 500 mg at 04/01/20 1455 .  nicotine (NICODERM CQ - dosed in mg/24 hr) patch 7 mg, 7 mg, Transdermal, Daily, [name], DPM, 7 mg at 04/01/20 1016 .  nutrition supplement (JUVEN) (JUVEN) powder packet 1 packet, 1 packet, Oral, BID BM, Oswald Hillock, MD, 1 packet at 03/31/20 1739 .  ondansetron (ZOFRAN) tablet 4 mg, 4 mg, Oral, Q6H PRN **OR** ondansetron (ZOFRAN) injection 4 mg, 4 mg, Intravenous, Q6H PRN, [name], DPM, 4 mg at 04/01/20 0903 .  oxyCODONE (Oxy IR/ROXICODONE) immediate release tablet 5 mg, 5 mg, Oral, Q4H PRN, [name], DPM, 5 mg at 03/29/20 0917 .  prochlorperazine (COMPAZINE) injection 10 mg, 10 mg, Intravenous, Q6H PRN, Lang Snow, FNP, 10 mg at 04/01/20 1014 .  vancomycin (VANCOCIN) IVPB 1000 mg/200 mL premix, 1,000 mg, Intravenous, Q36H, [name], RPH, Last Rate: 200 mL/hr at 03/31/20 1002, 1,000 mg at 03/31/20 1002  Allergies  Allergen Reactions  . Bee Venom Swelling    SWELLING REACTION UNSPECIFIED   . Penicillins Hives and  Other (See Comments)    Full body hives as a child with no shortness of breath or swelling  **Can tolerate cephalosporins**  . Clonidine Derivatives     Pt states "It is making me dizzy"     Objective: Today's Vitals   03/31/20 2143 04/01/20 0416 04/01/20 0903 04/01/20 1515  BP: 136/78 137/71  132/74  Pulse: 80 83  76  Resp: 16 19  15   Temp: 98 F (36.7 C) 99.3 F (37.4 C)  97.9 F (36.6 C)  TempSrc: Oral Oral  Oral  SpO2: 98% 99%  92%  Weight:      Height:      PainSc: 0-No pain 0-No pain 0-No pain     General: No acute distress  Right Lower extremity: Dressing to left foot clean, dry, intact. No strikethrough noted, Upon removal of VAC staples intact with no dehiscence. Minimal SS drainage in cannister. Wound macerated today, but granulating in well. No other acute signs of infection. No calf pain. Range of motion excluding surgical site within  normal limits with no pain or crepitation.    Results for orders placed or performed during the hospital encounter of 03/25/20  Resp Panel by RT-PCR (Flu A&B, Covid) Nasopharyngeal Swab     Status: None   Collection Time: 03/25/20  2:24 PM   Specimen: Nasopharyngeal Swab; Nasopharyngeal(NP) swabs in vial transport medium  Result Value Ref Range Status   SARS Coronavirus 2 by RT PCR NEGATIVE NEGATIVE Final    Comment: (NOTE) SARS-CoV-2 target nucleic acids are NOT DETECTED.  The SARS-CoV-2 RNA is generally detectable in upper respiratory specimens during the acute phase of infection. The lowest concentration of SARS-CoV-2 viral copies this assay can detect is 138 copies/mL. A negative result does not preclude SARS-Cov-2 infection and should not be used as the sole basis for treatment or other patient management decisions. A negative result may occur with  improper specimen collection/handling, submission of specimen other than nasopharyngeal swab, presence of viral mutation(s) within the areas targeted by this assay, and  inadequate number of viral copies(<138 copies/mL). A negative result must be combined with clinical observations, patient history, and epidemiological information. The expected result is Negative.  Fact Sheet for Patients:  EntrepreneurPulse.com.au  Fact Sheet for Healthcare Providers:  IncredibleEmployment.be  This test is no t yet approved or cleared by the [place] FDA and  has been authorized for detection and/or diagnosis of SARS-CoV-2 by FDA under an Emergency Use Authorization (EUA). This EUA will remain  in effect (meaning this test can be used) for the duration of the COVID-19 declaration under Section 564(b)(1) of the Act, 21 U.S.C.section 360bbb-3(b)(1), unless the authorization is terminated  or revoked sooner.       Influenza A by PCR NEGATIVE NEGATIVE Final   Influenza B by PCR NEGATIVE NEGATIVE Final    Comment: (NOTE) The Xpert Xpress SARS-CoV-2/FLU/RSV plus assay is intended as an aid in the diagnosis of influenza from Nasopharyngeal swab specimens and should not be used as a sole basis for treatment. Nasal washings and aspirates are unacceptable for Xpert Xpress SARS-CoV-2/FLU/RSV testing.  Fact Sheet for Patients: EntrepreneurPulse.com.au  Fact Sheet for Healthcare Providers: IncredibleEmployment.be  This test is not yet approved or cleared by the [place] FDA and has been authorized for detection and/or diagnosis of SARS-CoV-2 by FDA under an Emergency Use Authorization (EUA). This EUA will remain in effect (meaning this test can be used) for the duration of the COVID-19 declaration under Section 564(b)(1) of the Act, 21 U.S.C. section 360bbb-3(b)(1), unless the authorization is terminated or revoked.  Performed at [facility], [place] [address], [place], [place] [address]   Culture, blood (routine x 2)     Status: None (Preliminary result)   Collection  Time: 03/25/20  3:00 PM   Specimen: BLOOD  Result Value Ref Range Status   Specimen Description   Final    BLOOD LEFT ANTECUBITAL Performed at [facility] [address], [place], [place] [address]    Special Requests   Final    BOTTLES DRAWN AEROBIC AND ANAEROBIC Blood Culture adequate volume Performed at [facility] [address] [place], [place] [address]    Culture  Setup Time   Final    GRAM POSITIVE COCCI IN CLUSTERS ANAEROBIC BOTTLE ONLY CRITICAL RESULT CALLED TO, READ BACK BY AND VERIFIED WITH: C. SHADE PHARMD, AT 1055 03/30/20 BY Rush Landmark Performed at [facility], [place] [address] [place], [place] [address]    Culture GRAM POSITIVE COCCI  Final   Report Status PENDING  Incomplete  Culture, blood (routine x 2)     Status: None   Collection Time: 03/25/20  3:00 PM   Specimen: BLOOD LEFT HAND  Result Value Ref Range Status   Specimen Description   Final    BLOOD LEFT HAND Performed at [facility] [address], [place], [place] [address]    Special Requests   Final    BOTTLES DRAWN AEROBIC AND ANAEROBIC Blood Culture adequate volume Performed at [place] [facility] [address] [place], [place] [address]    Culture   Final    NO GROWTH 5 DAYS Performed at [facility], [place] [address] [place], [place] [address]    Report Status 03/30/2020 FINAL  Final  Blood Culture ID Panel (Reflexed)     Status: None   Collection Time: 03/25/20  3:00 PM  Result Value Ref Range Status   Enterococcus faecalis NOT DETECTED NOT DETECTED Final   Enterococcus Faecium NOT DETECTED NOT DETECTED Final   Listeria monocytogenes NOT DETECTED NOT DETECTED Final   Staphylococcus species NOT DETECTED NOT DETECTED Final   Staphylococcus aureus (BCID) NOT DETECTED NOT DETECTED Final   Staphylococcus epidermidis NOT DETECTED NOT DETECTED Final   Staphylococcus lugdunensis NOT DETECTED NOT DETECTED Final    Streptococcus species NOT DETECTED NOT DETECTED Final   Streptococcus agalactiae NOT DETECTED NOT DETECTED Final   Streptococcus pneumoniae NOT DETECTED NOT DETECTED Final   Streptococcus pyogenes NOT DETECTED NOT DETECTED Final   A.calcoaceticus-baumannii NOT DETECTED NOT DETECTED Final   Bacteroides fragilis NOT DETECTED NOT DETECTED Final   Enterobacterales NOT DETECTED NOT DETECTED Final   Enterobacter cloacae complex NOT DETECTED NOT DETECTED Final   Escherichia coli NOT DETECTED NOT DETECTED Final   Klebsiella aerogenes NOT DETECTED NOT DETECTED Final   Klebsiella oxytoca NOT DETECTED NOT DETECTED Final   Klebsiella pneumoniae NOT DETECTED NOT DETECTED Final   Proteus species NOT DETECTED NOT DETECTED Final   Salmonella species NOT DETECTED NOT DETECTED Final   Serratia marcescens NOT DETECTED NOT DETECTED Final   Haemophilus influenzae NOT DETECTED NOT DETECTED Final   Neisseria meningitidis NOT DETECTED NOT DETECTED Final   Pseudomonas aeruginosa NOT DETECTED NOT DETECTED Final   Stenotrophomonas maltophilia NOT DETECTED NOT DETECTED Final   Candida albicans NOT DETECTED NOT DETECTED Final   Candida auris NOT DETECTED NOT DETECTED Final   Candida glabrata NOT DETECTED NOT DETECTED Final   Candida krusei NOT DETECTED NOT DETECTED Final   Candida parapsilosis NOT DETECTED NOT DETECTED Final   Candida tropicalis NOT DETECTED NOT DETECTED Final   Cryptococcus neoformans/gattii NOT DETECTED NOT DETECTED Final    Comment: Performed at [facility], [address] [place], [place] [address]  Aerobic/Anaerobic Culture (surgical/deep wound)     Status: None   Collection Time: 03/25/20  8:12 PM   Specimen: Wound; Tissue  Result Value Ref Range Status   Specimen Description   Final    WOUND Performed at [facility] [address], [place], [place] [address]    Special Requests   Final    NONE Performed at [facility], [place] [address] [place], [place] [address]    Gram Stain   Final    NO WBC SEEN NO ORGANISMS SEEN Performed at [facility], [place] [address] [place],  [address]    Culture   Final    RARE STAPHYLOCOCCUS LUGDUNENSIS MIXED  ANAEROBIC FLORA PRESENT.  CALL LAB IF FURTHER IID REQUIRED.    Report Status 04/01/2020 FINAL  Final   Organism ID, Bacteria STAPHYLOCOCCUS LUGDUNENSIS  Final      Susceptibility   Staphylococcus lugdunensis - MIC*    CIPROFLOXACIN <=0.5 SENSITIVE Sensitive     ERYTHROMYCIN >=8 RESISTANT Resistant     GENTAMICIN <=0.5 SENSITIVE Sensitive     OXACILLIN >=4 RESISTANT Resistant     TETRACYCLINE <=1 SENSITIVE Sensitive     VANCOMYCIN <=0.5 SENSITIVE Sensitive     TRIMETH/SULFA <=10 SENSITIVE Sensitive     CLINDAMYCIN >=8 RESISTANT Resistant     RIFAMPIN <=0.5 SENSITIVE Sensitive     Inducible Clindamycin NEGATIVE Sensitive     * RARE STAPHYLOCOCCUS LUGDUNENSIS  Aerobic/Anaerobic Culture (surgical/deep wound)     Status: None   Collection Time: 03/27/20  8:30 AM   Specimen: Foot, Right; Wound  Result Value Ref Range Status   Specimen Description   Final    FOOT RIGHT 5TH METATARSAL Performed at [facility] [address], [place], [place] [address]    Special Requests   Final    NONE Performed at [facility], [place] [address], [place], [place] [address]    Gram Stain NO WBC SEEN NO ORGANISMS SEEN   Final   Culture   Final    No growth aerobically or anaerobically. Performed at [facility], [place] [address] [place], [place] [address]    Report Status 04/01/2020 FINAL  Final  Culture, blood (routine x 2)     Status: None (Preliminary result)   Collection Time: 03/31/20  4:54 AM   Specimen: BLOOD  Result Value Ref Range Status   Specimen Description   Final    BLOOD FOREARM Performed at [facility] [address], [place], [place] [address]    Special Requests   Final    BOTTLES DRAWN  AEROBIC ONLY Blood Culture adequate volume Performed at [facility] [address], [place], [place] [address]    Culture   Final    NO GROWTH < 24 HOURS Performed at [place] [address], [place], [place] [address]    Report Status PENDING  Incomplete  Culture, blood (routine x 2)     Status: None (Preliminary result)   Collection Time: 03/31/20  4:54 AM   Specimen: BLOOD LEFT HAND  Result Value Ref Range Status   Specimen Description   Final    BLOOD LEFT HAND Performed at [facility] [address] [place], [place] [address]    Special Requests   Final    BOTTLES DRAWN AEROBIC ONLY Blood Culture adequate volume Performed at [facility] [address] [place], [place] [address]    Culture   Final    NO GROWTH < 24 HOURS Performed at [facility] [address], [place], [place] [address]    Report Status PENDING  Incomplete      Assessment and Plan:  Problem List Items Addressed This Visit   None   Visit Diagnoses    Acute osteomyelitis of right foot ([place])    -  Primary   Relevant Medications   ceFEPIme (MAXIPIME) 2 g in sodium chloride 0.9 % 100 mL IVPB (Completed)   metroNIDAZOLE (FLAGYL) tablet 500 mg (Completed)   vancomycin (VANCOREADY) IVPB 1500 mg/300 mL (Completed)   cefTRIAXone (ROCEPHIN) 2 g in sodium chloride 0.9 % 100 mL  IVPB   metroNIDAZOLE (FLAGYL) tablet 500 mg   clindamycin (CLEOCIN) IVPB 900 mg (Completed)   vancomycin (VANCOCIN) IVPB 1000 mg/200 mL premix   Post-operative state       Relevant Orders   DG Foot 2 Views Right (Completed)   DG Foot 2 Views Right (Completed)   Diabetes mellitus due to underlying condition with diabetic autonomic neuropathy, unspecified whether long term insulin use ([name])       Relevant Medications   aspirin EC tablet 81 mg   atorvastatin (LIPITOR) tablet 20 mg   insulin aspart (novoLOG) injection 0-9 Units   clindamycin (CLEOCIN)  IVPB 900 mg (Completed)   insulin glargine (LANTUS) injection 5 Units   Vomiting       Relevant Orders   DG Abd 2 Views (Completed)     -Patient seen and evaluated at bedside -Continue with wound vac dressing changes - wound VAC not reapplied today given maceration. Resume VAC tomorrow; Patient will need home care for vac.  -At this time there are no additional podiatric procedures planned; patient is stable from podiatry point of view -WBC downtrending. Clean margin suggestive of no residual OM. At d/c can de-escalate to 2 weeks PO abx for ST infection. -Weightbearing to heel with CAM boot on right -Continue PT/OT -Continue with rest and elevation to assist with pain and edema control  -Anticipated discharge plan of care: D/c home vs SNF  [name], DPM

## 2020-04-01 NOTE — Progress Notes (Signed)
Patient up to bathroom and chair several times on 7 a to 7  p shift, balance poor, patient at significant risk for falling. Very poor po intake, tells nursing staff he will eat something but then does not.  Ate no solid food this shift, did drink an ensure and gatorade.

## 2020-04-01 NOTE — Progress Notes (Signed)
Patient ID: [name], male   DOB: 1970/12/19, 50 y.o.   MRN: [id number]  PROGRESS NOTE    [name]  UXL:[id number] DOB: November 20, 1970 DOA: 03/25/2020 PCP: Gildardo Pounds, NP   Brief Narrative:  50 year old male with history of diabetes mellitus type 2, prior amputation of the hypertension, chronic renal disease stage III presented with worsening right cellulitis wound with foul-smelling discharge.  He was started on broad-spectrum antibiotics for right foot cellulitis concerning for necrosis and gangrene.  Podiatry was consulted.  He underwent I&D of right foot along with partial fifth ray resection.  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  Currently still on broad-spectrum antibiotics including Rocephin, vancomycin and Flagyl.  Wound cultures growing Staphylococcus lugdunensis: Susceptibilities pending.  Blood cultures as below -Currently afebrile with significant leukocytosis.  CRP 6.6 today -PT recommends SNF.  Consult social worker  Gram-positive bacteremia -Blood cultures from 03/25/2020 was reported positive on 03/30/2020 for gram-positive cocci in clusters.  Await final identification and sensitivities.  Blood cultures repeated on 03/31/2020 are negative so far. -2D echo did not show any vegetation.  Leukocytosis -WBCs 21.1 today; slight improvement since yesterday.  Continue antibiotics.  Follow cultures  Thrombocytosis -Probably reactive.  Monitor  Gastroenteritis -Improving.  Still intermittently vomiting.  Off IV fluids. -Use antiemetics as needed  Diabetes mellitus type 2 with hypoglycemia -Continue CBGs with SSI.  DC Lantus because of persistent hypoglycemia.  Chronic renal disease stage IIIb -Creatinine 2.15 today.  Monitor  Anemia of chronic disease -From renal failure.  Hemoglobin stable.   Monitor  Hyponatremia -Improved.  Neuropathy -Continue gabapentin  Hypertension -Blood pressure stable.  Continue with Norvasc, Coreg and clonidine  Tobacco abuse -Continue nicotine patch  DVT prophylaxis: Heparin subcutaneous Code Status: Full Family Communication: None at bedside Disposition Plan: Status is: Inpatient  Remains inpatient appropriate because:Inpatient level of care appropriate due to severity of illness   Dispo: The patient is from: Home              Anticipated d/c is to: SNF              Anticipated d/c date is: 2 days              Patient currently is not medically stable to d/c.  Consultants: Podiatry  Procedures: I&D for gas gangrene of right foot along with partial fifth ray resection  Antimicrobials:  Anti-infectives (From admission, onward)   Start     Dose/Rate Route Frequency Ordered Stop   03/31/20 1000  vancomycin (VANCOCIN) IVPB 1000 mg/200 mL premix        1,000 mg 200 mL/hr over 60 Minutes Intravenous Every 36 hours 03/31/20 0914     03/29/20 1800  vancomycin (VANCOCIN) IVPB 1000 mg/200 mL premix  Status:  Discontinued        1,000 mg 200 mL/hr over 60 Minutes Intravenous Every 48 hours 03/28/20 1012 03/31/20 0914   03/27/20 1500  vancomycin (VANCOREADY) IVPB 1250 mg/250 mL  Status:  Discontinued        1,250 mg 166.7 mL/hr over 90 Minutes Intravenous Every 48 hours 03/25/20 2020 03/28/20 1012   03/27/20 0815  vancomycin (VANCOCIN) powder  Status:  Discontinued          As needed 03/27/20 0815 03/27/20 1103   03/27/20 0600  clindamycin (CLEOCIN) IVPB 900 mg  900 mg 100 mL/hr over 30 Minutes Intravenous On call to O.R. 03/26/20 1633 03/27/20 0745   03/25/20 2200  metroNIDAZOLE (FLAGYL) tablet 500 mg        500 mg Oral Every 8 hours 03/25/20 1841     03/25/20 1845  cefTRIAXone (ROCEPHIN) 2 g in sodium chloride 0.9 % 100 mL IVPB        2 g 200 mL/hr over 30 Minutes Intravenous Every 24 hours 03/25/20 1841     03/25/20 1500   vancomycin (VANCOREADY) IVPB 1500 mg/300 mL        1,500 mg 150 mL/hr over 120 Minutes Intravenous  Once 03/25/20 1426 03/25/20 1744   03/25/20 1430  ceFEPIme (MAXIPIME) 2 g in sodium chloride 0.9 % 100 mL IVPB        2 g 200 mL/hr over 30 Minutes Intravenous  Once 03/25/20 1425 03/25/20 1510   03/25/20 1430  metroNIDAZOLE (FLAGYL) tablet 500 mg        500 mg Oral  Once 03/25/20 1425 03/25/20 1442       Subjective: Patient seen and examined at bedside.  Poor historian.  Denies worsening lower extremity pain, fever.  Wants to go home today.  Nursing staff reports vomiting this morning and low blood sugars. objective: Vitals:   03/31/20 1304 03/31/20 1417 03/31/20 2143 04/01/20 0416  BP: 135/77 (!) 142/82 136/78 137/71  Pulse: 79 77 80 83  Resp: 16  16 19   Temp: 98.6 F (37 C)  98 F (36.7 C) 99.3 F (37.4 C)  TempSrc: Oral  Oral Oral  SpO2:  98% 98% 99%  Weight:      Height:        Intake/Output Summary (Last 24 hours) at 04/01/2020 0804 Last data filed at 04/01/2020 0600 Gross per 24 hour  Intake 800 ml  Output 965 ml  Net -165 ml   Filed Weights   03/25/20 1916  Weight: 90.7 kg    Examination:  General exam: No distress.  Chronically ill looking.   Respiratory system: Bilateral decreased breath sounds at bases with some scattered crackles cardiovascular system: S1-S2 heard, rate controlled  gastrointestinal system: Abdomen is nondistended, soft and nontender.  Normal bowel sounds heard  extremities: Right foot has a dressing.  Mild lower extremity edema present.  No cyanosis.   Central nervous system: Alert and awake.  No focal neurological deficits.  Moving extremities  skin: No other ecchymosis/lesions  psychiatry: Flat affect   Data Reviewed: I have personally reviewed following labs and imaging studies  CBC: Recent Labs  Lab 03/25/20 1500 03/26/20 0403 03/28/20 0305 03/29/20 0443 03/30/20 0749 03/31/20 0454 04/01/20 0412  WBC 17.8*   < > 15.4* 23.6*  26.0* 24.3* 21.1*  NEUTROABS 13.1*  --   --   --  21.7* 20.3* 16.7*  HGB 9.4*   < > 7.8* 8.4* 8.1* 7.9* 8.2*  HCT 30.1*   < > 26.1* 27.4* 26.4* 25.4* 26.9*  MCV 87.0   < > 91.6 89.5 88.9 87.6 90.3  PLT 428*   < > 378 423* 443* 451* 453*   < > = values in this interval not displayed.   Basic Metabolic Panel: Recent Labs  Lab 03/27/20 0426 03/29/20 0443 03/30/20 0749 03/31/20 0454 04/01/20 0412  NA 136 137 133* 140 138  K 3.9 3.7 3.7 3.8 3.5  CL 110 112* 109 115* 112*  CO2 19* 17* 17* 18* 18*  GLUCOSE 121* 113* 135* 65* 140*  BUN 28* 36*  29* 29* 23*  CREATININE 2.89* 2.79* 2.45* 2.34* 2.15*  CALCIUM 8.1* 8.2* 8.0* 8.2* 8.0*  MG  --   --  1.7 1.8 1.9   GFR: Estimated Creatinine Clearance: 41.7 mL/min (A) (by C-G formula based on SCr of 2.15 mg/dL (H)). Liver Function Tests: Recent Labs  Lab 03/25/20 1500 03/26/20 0403 03/30/20 0749  AST 14* 13* 15  ALT 28 21 15   ALKPHOS 83 68 60  BILITOT 0.3 0.4 0.2*  PROT 7.9 6.8 6.5  ALBUMIN 2.9* 2.6* 2.3*   No results for input(s): LIPASE, AMYLASE in the last 168 hours. No results for input(s): AMMONIA in the last 168 hours. Coagulation Profile: No results for input(s): INR, PROTIME in the last 168 hours. Cardiac Enzymes: No results for input(s): CKTOTAL, CKMB, CKMBINDEX, TROPONINI in the last 168 hours. BNP (last 3 results) No results for input(s): PROBNP in the last 8760 hours. HbA1C: No results for input(s): HGBA1C in the last 72 hours. CBG: Recent Labs  Lab 03/31/20 1626 03/31/20 2009 04/01/20 0020 04/01/20 0410 04/01/20 0736  GLUCAP 101* 64* 92 118* 101*   Lipid Profile: No results for input(s): CHOL, HDL, LDLCALC, TRIG, CHOLHDL, LDLDIRECT in the last 72 hours. Thyroid Function Tests: No results for input(s): TSH, T4TOTAL, FREET4, T3FREE, THYROIDAB in the last 72 hours. Anemia Panel: No results for input(s): VITAMINB12, FOLATE, FERRITIN, TIBC, IRON, RETICCTPCT in the last 72 hours. Sepsis Labs: Recent Labs   Lab 03/25/20 1500  LATICACIDVEN 1.3    Recent Results (from the past 240 hour(s))  WOUND CULTURE     Status: None   Collection Time: 03/25/20 11:00 AM   Specimen: Foot; Wound  Result Value Ref Range Status   MICRO NUMBER: [id number]  Final   SPECIMEN QUALITY: Adequate  Final   SOURCE: ULCER RIGHT FOOT  Final   STATUS: FINAL  Final   GRAM STAIN:   Final    Many White blood cells seen Rare epithelial cells Many Gram negative bacilli Many Gram positive cocci in clusters   RESULT:   Final    Growth of skin flora (note: Growth does not include S. aureus, beta-hemolytic Streptococci or P. aeruginosa).  Resp Panel by RT-PCR (Flu A&B, Covid) Nasopharyngeal Swab     Status: None   Collection Time: 03/25/20  2:24 PM   Specimen: Nasopharyngeal Swab; Nasopharyngeal(NP) swabs in vial transport medium  Result Value Ref Range Status   SARS Coronavirus 2 by RT PCR NEGATIVE NEGATIVE Final    Comment: (NOTE) SARS-CoV-2 target nucleic acids are NOT DETECTED.  The SARS-CoV-2 RNA is generally detectable in upper respiratory specimens during the acute phase of infection. The lowest concentration of SARS-CoV-2 viral copies this assay can detect is 138 copies/mL. A negative result does not preclude SARS-Cov-2 infection and should not be used as the sole basis for treatment or other patient management decisions. A negative result may occur with  improper specimen collection/handling, submission of specimen other than nasopharyngeal swab, presence of viral mutation(s) within the areas targeted by this assay, and inadequate number of viral copies(<138 copies/mL). A negative result must be combined with clinical observations, patient history, and epidemiological information. The expected result is Negative.  Fact Sheet for Patients:  EntrepreneurPulse.com.au  Fact Sheet for Healthcare Providers:  IncredibleEmployment.be  This test is no t yet approved or cleared by  the [place] FDA and  has been authorized for detection and/or diagnosis of SARS-CoV-2 by FDA under an Emergency Use Authorization (EUA). This EUA will remain  in  effect (meaning this test can be used) for the duration of the COVID-19 declaration under Section 564(b)(1) of the Act, 21 U.S.C.section 360bbb-3(b)(1), unless the authorization is terminated  or revoked sooner.       Influenza A by PCR NEGATIVE NEGATIVE Final   Influenza B by PCR NEGATIVE NEGATIVE Final    Comment: (NOTE) The Xpert Xpress SARS-CoV-2/FLU/RSV plus assay is intended as an aid in the diagnosis of influenza from Nasopharyngeal swab specimens and should not be used as a sole basis for treatment. Nasal washings and aspirates are unacceptable for Xpert Xpress SARS-CoV-2/FLU/RSV testing.  Fact Sheet for Patients: EntrepreneurPulse.com.au  Fact Sheet for Healthcare Providers: IncredibleEmployment.be  This test is not yet approved or cleared by the [place] FDA and has been authorized for detection and/or diagnosis of SARS-CoV-2 by FDA under an Emergency Use Authorization (EUA). This EUA will remain in effect (meaning this test can be used) for the duration of the COVID-19 declaration under Section 564(b)(1) of the Act, 21 U.S.C. section 360bbb-3(b)(1), unless the authorization is terminated or revoked.  Performed at [facility], [place] [address], [place], [place] [address]   Culture, blood (routine x 2)     Status: None (Preliminary result)   Collection Time: 03/25/20  3:00 PM   Specimen: BLOOD  Result Value Ref Range Status   Specimen Description   Final    BLOOD LEFT ANTECUBITAL Performed at [facility] [address], [name], [place] [address]    Special Requests   Final    BOTTLES DRAWN AEROBIC AND ANAEROBIC Blood Culture adequate volume Performed at [facility] [address] [place],  [place] [address]    Culture  Setup Time   Final    GRAM POSITIVE COCCI IN CLUSTERS ANAEROBIC BOTTLE ONLY CRITICAL RESULT CALLED TO, READ BACK BY AND VERIFIED WITH: C. SHADE PHARMD, AT 1055 03/30/20 BY Rush Landmark Performed at [facility], [place] [address] [place], [place] [address]    Culture GRAM POSITIVE COCCI  Final   Report Status PENDING  Incomplete  Culture, blood (routine x 2)     Status: None   Collection Time: 03/25/20  3:00 PM   Specimen: BLOOD LEFT HAND  Result Value Ref Range Status   Specimen Description   Final    BLOOD LEFT HAND Performed at [facility] [address] [place], [place] [address]    Special Requests   Final    BOTTLES DRAWN AEROBIC AND ANAEROBIC Blood Culture adequate volume Performed at [facility] [address], [place], [place] [address]    Culture   Final    NO GROWTH 5 DAYS Performed at [facility], [place] [address], [place], [place] [address]    Report Status 03/30/2020 FINAL  Final  Blood Culture ID Panel (Reflexed)     Status: None   Collection Time: 03/25/20  3:00 PM  Result Value Ref Range Status   Enterococcus faecalis NOT DETECTED NOT DETECTED Final   Enterococcus Faecium NOT DETECTED NOT DETECTED Final   Listeria monocytogenes NOT DETECTED NOT DETECTED Final   Staphylococcus species NOT DETECTED NOT DETECTED Final   Staphylococcus aureus (BCID) NOT DETECTED NOT DETECTED Final   Staphylococcus epidermidis NOT DETECTED NOT DETECTED Final   Staphylococcus lugdunensis NOT DETECTED NOT DETECTED Final   Streptococcus species NOT DETECTED NOT DETECTED Final   Streptococcus agalactiae NOT DETECTED NOT DETECTED Final   Streptococcus pneumoniae NOT DETECTED NOT  DETECTED Final   Streptococcus pyogenes NOT DETECTED NOT DETECTED Final   A.calcoaceticus-baumannii NOT DETECTED NOT DETECTED Final   Bacteroides fragilis NOT DETECTED NOT DETECTED Final   Enterobacterales NOT DETECTED NOT DETECTED Final    Enterobacter cloacae complex NOT DETECTED NOT DETECTED Final   Escherichia coli NOT DETECTED NOT DETECTED Final   Klebsiella aerogenes NOT DETECTED NOT DETECTED Final   Klebsiella oxytoca NOT DETECTED NOT DETECTED Final   Klebsiella pneumoniae NOT DETECTED NOT DETECTED Final   Proteus species NOT DETECTED NOT DETECTED Final   Salmonella species NOT DETECTED NOT DETECTED Final   Serratia marcescens NOT DETECTED NOT DETECTED Final   Haemophilus influenzae NOT DETECTED NOT DETECTED Final   Neisseria meningitidis NOT DETECTED NOT DETECTED Final   Pseudomonas aeruginosa NOT DETECTED NOT DETECTED Final   Stenotrophomonas maltophilia NOT DETECTED NOT DETECTED Final   Candida albicans NOT DETECTED NOT DETECTED Final   Candida auris NOT DETECTED NOT DETECTED Final   Candida glabrata NOT DETECTED NOT DETECTED Final   Candida krusei NOT DETECTED NOT DETECTED Final   Candida parapsilosis NOT DETECTED NOT DETECTED Final   Candida tropicalis NOT DETECTED NOT DETECTED Final   Cryptococcus neoformans/gattii NOT DETECTED NOT DETECTED Final    Comment: Performed at [facility], [address] [place], [place] [address]  Aerobic/Anaerobic Culture (surgical/deep wound)     Status: None (Preliminary result)   Collection Time: 03/25/20  8:12 PM   Specimen: Wound; Tissue  Result Value Ref Range Status   Specimen Description   Final    WOUND Performed at [facility] [address], [place], [place] [address]    Special Requests   Final    NONE Performed at [facility], [place] [address], [place], [place] [address]    Gram Stain   Final    NO WBC SEEN NO ORGANISMS SEEN Performed at [name], [place] [address], [place], [place] [address]    Culture   Final    RARE STAPHYLOCOCCUS LUGDUNENSIS SUSCEPTIBILITIES TO FOLLOW MIXED ANAEROBIC FLORA PRESENT.  CALL LAB IF FURTHER IID REQUIRED.    Report Status PENDING  Incomplete  Aerobic/Anaerobic Culture  (surgical/deep wound)     Status: None (Preliminary result)   Collection Time: 03/27/20  8:30 AM   Specimen: Foot, Right; Wound  Result Value Ref Range Status   Specimen Description   Final    FOOT RIGHT 5TH METATARSAL Performed at [facility] [address], [place], [place] [address]    Special Requests   Final    NONE Performed at [facility], [place] [address], [place], [place] [address]    Gram Stain NO WBC SEEN NO ORGANISMS SEEN   Final   Culture   Final    NO GROWTH 4 DAYS Performed at [facility], [place] [address] [place], [place] [address]    Report Status PENDING  Incomplete  Culture, blood (routine x 2)     Status: None (Preliminary result)   Collection Time: 03/31/20  4:54 AM   Specimen: BLOOD  Result Value Ref Range Status   Specimen Description   Final    BLOOD FOREARM Performed at [facility] [address], [place], [place] [address]    Special Requests   Final    BOTTLES DRAWN AEROBIC ONLY Blood Culture adequate volume Performed at [facility] [address] [place],  [address]    Culture  Final    NO GROWTH < 24 HOURS Performed at [facility], [place] [address] [place], [place] [address]    Report Status PENDING  Incomplete  Culture, blood (routine x 2)     Status: None (Preliminary result)   Collection Time: 03/31/20  4:54 AM   Specimen: BLOOD LEFT HAND  Result Value Ref Range Status   Specimen Description   Final    BLOOD LEFT HAND Performed at [facility] [address] [place], [place] [address]    Special Requests   Final    BOTTLES DRAWN AEROBIC ONLY Blood Culture adequate volume Performed at [facility] [address] [place], [place] [address]    Culture   Final    NO GROWTH < 24 HOURS Performed at [facility] [address] [place],  [address]    Report Status PENDING  Incomplete          Radiology Studies: ECHOCARDIOGRAM COMPLETE  Result Date: 03/31/2020    ECHOCARDIOGRAM REPORT   Patient Name:   [name] Date of Exam: 03/31/2020 Medical Rec #:  [id number]        Height:       64.0 in Accession #:    [id number]       Weight:       200.0 lb Date of Birth:  [birth year]        BSA:          1.956 m Patient Age:    5 years         BP:           141/76 mmHg Patient Gender: M                HR:           77 bpm. Exam Location:  Inpatient Procedure: 2D Echo, Cardiac Doppler and Color Doppler Indications:    Bacteremia  History:        Patient has prior history of Echocardiogram examinations, most                 recent 01/22/2020. Signs/Symptoms:Bacteremia; Risk                 Factors:Diabetes, Current Smoker and Hypertension.  Sonographer:    Roseanna Rainbow RDCS Referring Phys: [id number] [facility]  Sonographer Comments: Technically difficult study due to poor echo windows. IMPRESSIONS  1. Left ventricular ejection fraction, by estimation, is 60 to 65%. Left ventricular ejection fraction by 2D MOD biplane is 64.1 %. Left ventricular ejection fraction by PLAX is 65 %. The left ventricle has normal function. The left ventricle has no regional wall motion abnormalities. There is mild concentric left ventricular hypertrophy. Left ventricular diastolic parameters are consistent with Grade I diastolic dysfunction (impaired relaxation).  2. Right ventricular systolic function is normal. The right ventricular size is normal.  3. The pericardial effusion is circumferential and posterior to the left ventricle. There is no evidence of cardiac tamponade.  4. The mitral valve is normal in structure. No evidence of mitral valve regurgitation. No evidence of mitral stenosis.  5. The aortic valve was not well visualized. Aortic valve regurgitation is not visualized. No aortic stenosis is present.  6. The inferior vena cava is normal in size with greater than 50% respiratory variability, suggesting  right atrial pressure of 3 mmHg. FINDINGS  Left Ventricle: Left ventricular ejection fraction, by estimation, is 60 to 65%. Left ventricular ejection  fraction by PLAX is 65 %. Left ventricular ejection fraction by 2D MOD biplane is 64.1 %. The left ventricle has normal function. The left ventricle has no regional wall motion abnormalities. The left ventricular internal cavity size was normal in size. There is mild concentric left ventricular hypertrophy. Left ventricular diastolic parameters are consistent with Grade I diastolic dysfunction (impaired relaxation). Indeterminate filling pressures. Right Ventricle: The right ventricular size is normal. No increase in right ventricular wall thickness. Right ventricular systolic function is normal. Left Atrium: Left atrial size was normal in size. Right Atrium: Right atrial size was normal in size. Pericardium: Trivial pericardial effusion is present. The pericardial effusion is circumferential and posterior to the left ventricle. There is no evidence of cardiac tamponade. Mitral Valve: The mitral valve is normal in structure. No evidence of mitral valve regurgitation. No evidence of mitral valve stenosis. Tricuspid Valve: The tricuspid valve is normal in structure. Tricuspid valve regurgitation is trivial. No evidence of tricuspid stenosis. Aortic Valve: The aortic valve was not well visualized. Aortic valve regurgitation is not visualized. No aortic stenosis is present. Pulmonic Valve: The pulmonic valve was normal in structure. Pulmonic valve regurgitation is not visualized. No evidence of pulmonic stenosis. Aorta: The aortic root is normal in size and structure. Venous: The inferior vena cava is normal in size with greater than 50% respiratory variability, suggesting right atrial pressure of 3 mmHg. IAS/Shunts: No atrial level shunt detected by color flow Doppler.  LEFT VENTRICLE PLAX 2D                        Biplane EF (MOD) LV EF:         Left            LV  Biplane EF:   Left                ventricular                      ventricular                ejection                         ejection                fraction by                      fraction by                PLAX is 65                       2D MOD                %.                               biplane is LVIDd:         3.70 cm                          64.1 %. LVIDs:         2.40 cm LV PW:         1.33 cm         Diastology LV IVS:        1.17 cm  LV e' medial:    9.46 cm/s LVOT diam:     1.90 cm         LV E/e' medial:  10.1 LV SV:         69              LV e' lateral:   8.38 cm/s LV SV Index:   35              LV E/e' lateral: 11.4 LVOT Area:     2.84 cm  LV Volumes (MOD) LV vol d, MOD    93.7 ml A2C: LV vol d, MOD    75.8 ml A4C: LV vol s, MOD    30.8 ml A2C: LV vol s, MOD    27.8 ml A4C: LV SV MOD A2C:   62.9 ml LV SV MOD A4C:   75.8 ml LV SV MOD BP:    54.9 ml RIGHT VENTRICLE             IVC RV S prime:     12.00 cm/s  IVC diam: 1.50 cm TAPSE (M-mode): 2.1 cm LEFT ATRIUM           Index       RIGHT ATRIUM          Index LA diam:      3.70 cm 1.89 cm/m  RA Area:     9.46 cm LA Vol (A2C): 40.6 ml 20.75 ml/m RA Volume:   17.70 ml 9.05 ml/m LA Vol (A4C): 26.4 ml 13.49 ml/m  AORTIC VALVE LVOT Vmax:   111.00 cm/s LVOT Vmean:  75.400 cm/s LVOT VTI:    0.243 m  AORTA Ao Root diam: 3.20 cm MITRAL VALVE MV Area (PHT): 3.53 cm    SHUNTS MV Decel Time: 215 msec    Systemic VTI:  0.24 m MV E velocity: 95.50 cm/s  Systemic Diam: 1.90 cm MV A velocity: 81.00 cm/s MV E/A ratio:  1.18 Eleonore Chiquito MD Electronically signed by Eleonore Chiquito MD Signature Date/Time: 03/31/2020/3:17:08 PM    Final         Scheduled Meds: . (feeding supplement) PROSource Plus  30 mL Oral BID BM  . amLODipine  10 mg Oral Daily  . aspirin EC  81 mg Oral Daily  . atorvastatin  20 mg Oral Daily  . B-complex with vitamin C  1 tablet Oral Daily  . buPROPion  150 mg Oral Daily  . carvedilol  25 mg Oral BID  . Chlorhexidine  Gluconate Cloth  6 each Topical UD  . cloNIDine  0.1 mg Oral BID  . escitalopram  10 mg Oral QHS  . ferrous sulfate  325 mg Oral Daily  . gabapentin  300 mg Oral TID  . heparin  5,000 Units Subcutaneous Q8H  . insulin aspart  0-9 Units Subcutaneous Q4H  . insulin glargine  5 Units Subcutaneous Daily  . metroNIDAZOLE  500 mg Oral Q8H  . nicotine  7 mg Transdermal Daily  . nutrition supplement (JUVEN)  1 packet Oral BID BM   Continuous Infusions: . cefTRIAXone (ROCEPHIN)  IV 2 g (03/31/20 1732)  . methocarbamol (ROBAXIN) IV    . vancomycin 1,000 mg (03/31/20 1002)          Aline August, MD [facility] 04/01/2020, 8:04 AM

## 2020-04-02 DIAGNOSIS — M86171 Other acute osteomyelitis, right ankle and foot: Secondary | ICD-10-CM | POA: Diagnosis not present

## 2020-04-02 DIAGNOSIS — E0843 Diabetes mellitus due to underlying condition with diabetic autonomic (poly)neuropathy: Secondary | ICD-10-CM | POA: Diagnosis not present

## 2020-04-02 DIAGNOSIS — Z9889 Other specified postprocedural states: Secondary | ICD-10-CM | POA: Diagnosis not present

## 2020-04-02 LAB — CBC WITH DIFFERENTIAL/PLATELET
Abs Immature Granulocytes: 0.3 10*3/uL — ABNORMAL HIGH (ref 0.00–0.07)
Basophils Absolute: 0.1 10*3/uL (ref 0.0–0.1)
Basophils Relative: 0 %
Eosinophils Absolute: 0.5 10*3/uL (ref 0.0–0.5)
Eosinophils Relative: 2 %
HCT: 26.6 % — ABNORMAL LOW (ref 39.0–52.0)
Hemoglobin: 8.4 g/dL — ABNORMAL LOW (ref 13.0–17.0)
Immature Granulocytes: 2 %
Lymphocytes Relative: 15 %
Lymphs Abs: 2.9 10*3/uL (ref 0.7–4.0)
MCH: 27.6 pg (ref 26.0–34.0)
MCHC: 31.6 g/dL (ref 30.0–36.0)
MCV: 87.5 fL (ref 80.0–100.0)
Monocytes Absolute: 1.1 10*3/uL — ABNORMAL HIGH (ref 0.1–1.0)
Monocytes Relative: 5 %
Neutro Abs: 14.5 10*3/uL — ABNORMAL HIGH (ref 1.7–7.7)
Neutrophils Relative %: 76 %
Platelets: 498 10*3/uL — ABNORMAL HIGH (ref 150–400)
RBC: 3.04 MIL/uL — ABNORMAL LOW (ref 4.22–5.81)
RDW: 15.8 % — ABNORMAL HIGH (ref 11.5–15.5)
WBC: 19.3 10*3/uL — ABNORMAL HIGH (ref 4.0–10.5)
nRBC: 0 % (ref 0.0–0.2)

## 2020-04-02 LAB — COMPREHENSIVE METABOLIC PANEL WITH GFR
ALT: 14 U/L (ref 0–44)
AST: 14 U/L — ABNORMAL LOW (ref 15–41)
Albumin: 2.4 g/dL — ABNORMAL LOW (ref 3.5–5.0)
Alkaline Phosphatase: 62 U/L (ref 38–126)
Anion gap: 7 (ref 5–15)
BUN: 20 mg/dL (ref 6–20)
CO2: 19 mmol/L — ABNORMAL LOW (ref 22–32)
Calcium: 7.9 mg/dL — ABNORMAL LOW (ref 8.9–10.3)
Chloride: 111 mmol/L (ref 98–111)
Creatinine, Ser: 2.28 mg/dL — ABNORMAL HIGH (ref 0.61–1.24)
GFR, Estimated: 34 mL/min — ABNORMAL LOW
Glucose, Bld: 139 mg/dL — ABNORMAL HIGH (ref 70–99)
Potassium: 3.8 mmol/L (ref 3.5–5.1)
Sodium: 137 mmol/L (ref 135–145)
Total Bilirubin: 0.4 mg/dL (ref 0.3–1.2)
Total Protein: 6.8 g/dL (ref 6.5–8.1)

## 2020-04-02 LAB — GLUCOSE, CAPILLARY
Glucose-Capillary: 129 mg/dL — ABNORMAL HIGH (ref 70–99)
Glucose-Capillary: 113 mg/dL — ABNORMAL HIGH (ref 70–99)
Glucose-Capillary: 103 mg/dL — ABNORMAL HIGH (ref 70–99)
Glucose-Capillary: 144 mg/dL — ABNORMAL HIGH (ref 70–99)
Glucose-Capillary: 202 mg/dL — ABNORMAL HIGH (ref 70–99)

## 2020-04-02 LAB — C-REACTIVE PROTEIN: CRP: 4.2 mg/dL — ABNORMAL HIGH

## 2020-04-02 LAB — CULTURE, BLOOD (ROUTINE X 2): Special Requests: ADEQUATE

## 2020-04-02 LAB — MAGNESIUM: Magnesium: 1.7 mg/dL (ref 1.7–2.4)

## 2020-04-02 MED ORDER — DOXYCYCLINE HYCLATE 100 MG PO TABS
100.0000 mg | ORAL_TABLET | Freq: Two times a day (BID) | ORAL | Status: DC
  Administered 2020-04-02 – 2020-04-08 (×12): 100 mg via ORAL
  Filled 2020-04-02 (×13): qty 1

## 2020-04-02 MED ORDER — INSULIN ASPART 100 UNIT/ML ~~LOC~~ SOLN
0.0000 [IU] | Freq: Three times a day (TID) | SUBCUTANEOUS | Status: DC
  Administered 2020-04-02: 2 [IU] via SUBCUTANEOUS
  Administered 2020-04-03 – 2020-04-05 (×3): 1 [IU] via SUBCUTANEOUS

## 2020-04-02 NOTE — Progress Notes (Signed)
Physical Therapy Treatment Patient Details Name: [name] MRN: [id number] DOB: March 14, 1971 Today's Date: 04/02/2020    History of Present Illness 50 year old male with past medical history of diabetes mellitus type 2, prior history of amputation L foot (great toe amp), hypertension, CKD stage III presented with worsening right foot. He had foul-smelling discharge coming from the R foot wound.  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 Continue to recommend  SNF. Will continue to update d/c rec's pending progress. Pt is agreeable to SNF at this time.   Follow Up Recommendations  SNF     Equipment Recommendations  None recommended by PT    Recommendations for Other Services       Precautions / Restrictions Precautions Precautions: Fall Required Braces or Orthoses: Other Brace Other Brace: cam boot on right Restrictions Weight Bearing Restrictions: No Other Position/Activity Restrictions: WBAT in camb[place]    Mobility  Bed Mobility Overal bed mobility: Needs Assistance Bed Mobility: Supine to Sit     Supine to sit: Supervision     General bed mobility comments: for safety, requires assist to don camboot RLE prior to OOB  Transfers Overall transfer level: Needs assistance Equipment used: Rolling walker (2 wheeled) Transfers: Sit to/from Stand Sit to Stand: Min assist         General transfer comment: verbal cues for hand placement, assist to safely rise and transition to RW  Ambulation/Gait Ambulation/Gait assistance: Min assist Gait Distance (Feet): 18 Feet Assistive device: Rolling walker (2 wheeled) Gait Pattern/deviations: Step-to pattern;Decreased stance time - right Gait velocity: decr   General Gait Details: assist for intermittent steadying, cues for step length and RW position. +2 for chair follow. pt fatigued after above distance  requiring seated rest   Stairs             Wheelchair Mobility    Modified Rankin (Stroke Patients Only)       Balance   Sitting-balance support: Feet supported;No upper extremity supported Sitting balance-Leahy Scale: Good     Standing balance support: Bilateral upper extremity supported Standing balance-Leahy Scale: Poor Standing balance comment: reliant on UEs and external assist                            Cognition Arousal/Alertness: Awake/alert Behavior During Therapy: WFL for tasks assessed/performed Overall Cognitive Status: Within Functional Limits for tasks assessed                                        Exercises      General Comments        Pertinent Vitals/Pain Pain Assessment: No/denies pain    Home Living                      Prior Function            PT Goals (current goals can now be found in the care plan section) Acute Rehab PT Goals Patient Stated Goal: home PT Goal Formulation: With patient Time For Goal Achievement: 04/12/20 Potential to Achieve Goals: Good Progress towards PT goals: Progressing toward goals    Frequency    Min 3X/week      PT Plan Current plan  remains appropriate    Co-evaluation              AM-PAC PT "6 Clicks" Mobility   Outcome Measure  Help needed turning from your back to your side while in a flat bed without using bedrails?: A Little Help needed moving from lying on your back to sitting on the side of a flat bed without using bedrails?: A Little Help needed moving to and from a bed to a chair (including a wheelchair)?: A Little Help needed standing up from a chair using your arms (e.g., wheelchair or bedside chair)?: A Little Help needed to walk in hospital room?: A Little Help needed climbing 3-5 steps with a railing? : A Lot 6 Click Score: 17    End of Session Equipment Utilized During Treatment: Gait belt Activity Tolerance: Patient tolerated  treatment well Patient left: in chair;with call bell/phone within reach;with chair alarm set Nurse Communication: Mobility status PT Visit Diagnosis: Other abnormalities of gait and mobility (R26.89)     Time: 3546-5681 PT Time Calculation (min) (ACUTE ONLY): 16 min  Charges:  $Gait Training: 8-22 mins                     Baxter Flattery, PT  Acute Rehab Dept ([place]) [phone] Pager [phone]  04/02/2020    [facility] 04/02/2020, 4:34 PM

## 2020-04-02 NOTE — Progress Notes (Signed)
Patient's wound a measurements: Length: 3 cm, Width: 1.5 cm, Depth: 0.04 cm. Wound vac dressing reapplied.  Jerene Pitch

## 2020-04-02 NOTE — TOC Progression Note (Signed)
Transition of Care [facility]) - Progression Note    Patient Details  Name: [name] MRN: [id number] Date of Birth: 23-Jul-1970  Transition of [facility]) CM/SW Contact  Purcell Mouton, RN Phone Number: 04/02/2020, 4:12 PM  Clinical Narrative:     Spoke with pt concerning discharge plans. Pt continued to decline SNF. At present time unable to find [facility] for pt. Explained to pt that he will need to go to Outpatient PT related to not being able to find [facility]. Pt may be able to go to wound care center for wound care. Pt understood. Pt was concerned about his truck in the ED. Asked pt to call [employer]. Will continue to follow.   Expected Discharge Plan: [place] Barriers to Discharge: No Barriers Identified  Expected Discharge Plan and Services Expected Discharge Plan: [place] arrangements for the past 2 months: Single Family Home                                       Social Determinants of Health (SDOH) Interventions    Readmission Risk Interventions No flowsheet data found.

## 2020-04-02 NOTE — Progress Notes (Signed)
Pt alert and aware sitting up in chair at bedside. Pt states he would like to go home. Yet the team is recommending that he go to rehab. We discussed this subject the pro and cons. The chaplain offered caring presence, prayers and blessing. He provided a new testament bible for pt to read. Further visits will be offered.

## 2020-04-02 NOTE — Progress Notes (Signed)
Patient's blood sugar was checked at 2032 and it was 139 mg/dL.

## 2020-04-02 NOTE — Progress Notes (Signed)
[facility]  PROGRESS NOTE  [name] OIN:[id number] DOB: June 24, 1970 DOA: 03/25/2020 PCP: Gildardo Pounds, NP Admit date - 03/25/2020   Admitting Physician Lavina Hamman, MD  Outpatient Primary MD for the patient is Gildardo Pounds, NP  LOS - 8 Brief Narrative   [name] is a 49 y.o. year old male with medical history significant for Type 2 diabetes, HTN, CKD stage III, chronic right foot wound presenting with worsening right foot pain for the past week and after palpation evaluation by his podiatrist was directed to the ED due to concern for right foot ulcer with osteomyelitis and gas gangrene based off wound evaluation) necrotic, red and, purulent drainage) and x-ray (outpatient) presents with osteomyelitis of fifth metatarsal with multifocal subcutaneous emphysema. He underwent emergent operative debridement on 12/30 with transmetatarsal amputation. During hospital course he is remained on vancomycin, ceftriaxone, and Flagyl for Peptostreptococcus bacteremia (1 of 2 blood cultures 03/25/2020) and Staphylococcus lugdunensis surgical wound culture.   Subjective  Feels foot pain well controlled. Denies any fevers or chills. Anxious to be able to go home.  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 Remains afebrile, white count slowly downtrending, repeat blood cultures unremarkable. Suspect polymicrobial, surgical culture positive for Staphylococcus lugdunensis -Transition vancomycin to doxycycline for staphylococcal bacteremia -Continue p.o. Flagyl for Peptostreptococcus, discontinue ceftriaxone -Plan to continue oral antibiotics for 2 weeks for soft tissue infection right foot -PT recommends SNF continue monitor CBC for downtrend  depending resuming wound VAC, and will need home care for VAC  Peptostreptococcus bacteremia. Repeat blood cultures on 1/30 remain negative. TTE with no vegetations -Continue to monitor on transition to doxycycline  Type 2 diabetes with peripheral neuropathy and hyperlipidemia. A1c 7.9. Glucose remains well controlled here -Continue Lantus 5 units, sliding scale insulin as needed -Continue gabapentin-continue statin  Pericardial effusion. Incidentally noted on TTE from 1/3. Trivial in size per report circumferential with no evidence of cardiac tamponade. Symptomatically has no dyspnea, no chest pain, no tachycardia -Closely monitor clinically  Chronic normocytic anemia, stable. Has history of iron deficiency anemia, takes iron at home. Slightly worse from baseline, likely related to postoperative changes. Previous baseline 9-10, currently 7.9-8.4. No episodes of bleeding-monitor CBC  CKD stage III, stable. Creatinine stable at baseline -Avoid nephrotoxins, monitor output  Hypertension at goal -Continue amlodipine, Coreg, clonidine -Home lisinopril held on admission  Mood disorder, stable-continue Lexapro, Wellbutrin  HLD, stable-continue Lipitor, aspirin    Family Communication  : None  Code Status : Full  Disposition Plan  :  Patient is from home. Anticipated d/c date: 1-2 days. Barriers to d/c or necessity for inpatient status:  Transitioning from IV antibiotics to p.o., needs close monitoring to ensure no repeat fevers, continue downtrending white count. PT recommends skilled nursing facility but patient likes to stay home, will arrange outpatient PT Consults  : Podiatry  Procedures  : Transmetatarsal amputation 12/30  DVT Prophylaxis  :  Heparin  MDM: The below labs and imaging reports were reviewed and summarized above.  Medication management as above.  Lab Results  Component Value Date   PLT 498 (H) 04/02/2020    Diet :  Diet Order            Diet heart  healthy/carb modified Room service appropriate? Yes; Fluid consistency: Thin  Diet  effective now                  Inpatient Medications Scheduled Meds: . (feeding supplement) PROSource Plus  30 mL Oral BID BM  . amLODipine  10 mg Oral Daily  . aspirin EC  81 mg Oral Daily  . atorvastatin  20 mg Oral Daily  . B-complex with vitamin C  1 tablet Oral Daily  . buPROPion  150 mg Oral Daily  . carvedilol  25 mg Oral BID  . Chlorhexidine Gluconate Cloth  6 each Topical UD  . cloNIDine  0.1 mg Oral BID  . doxycycline  100 mg Oral Q12H  . escitalopram  10 mg Oral QHS  . ferrous sulfate  325 mg Oral Daily  . gabapentin  300 mg Oral TID  . heparin  5,000 Units Subcutaneous Q8H  . insulin aspart  0-6 Units Subcutaneous TID WC  . insulin glargine  5 Units Subcutaneous Daily  . metroNIDAZOLE  500 mg Oral Q8H  . nicotine  7 mg Transdermal Daily  . nutrition supplement (JUVEN)  1 packet Oral BID BM   Continuous Infusions: . cefTRIAXone (ROCEPHIN)  IV 2 g (04/01/20 1707)  . methocarbamol (ROBAXIN) IV     PRN Meds:.acetaminophen **OR** acetaminophen, hydrALAZINE, HYDROmorphone (DILAUDID) injection, melatonin, methocarbamol (ROBAXIN) IV, ondansetron **OR** ondansetron (ZOFRAN) IV, oxyCODONE, prochlorperazine  Antibiotics  :   Anti-infectives (From admission, onward)   Start     Dose/Rate Route Frequency Ordered Stop   04/02/20 2200  doxycycline (VIBRA-TABS) tablet 100 mg        100 mg Oral Every 12 hours 04/02/20 1405     03/31/20 1000  vancomycin (VANCOCIN) IVPB 1000 mg/200 mL premix  Status:  Discontinued        1,000 mg 200 mL/hr over 60 Minutes Intravenous Every 36 hours 03/31/20 0914 04/02/20 1405   03/29/20 1800  vancomycin (VANCOCIN) IVPB 1000 mg/200 mL premix  Status:  Discontinued        1,000 mg 200 mL/hr over 60 Minutes Intravenous Every 48 hours 03/28/20 1012 03/31/20 0914   03/27/20 1500  vancomycin (VANCOREADY) IVPB 1250 mg/250 mL  Status:  Discontinued        1,250  mg 166.7 mL/hr over 90 Minutes Intravenous Every 48 hours 03/25/20 2020 03/28/20 1012   03/27/20 0815  vancomycin (VANCOCIN) powder  Status:  Discontinued          As needed 03/27/20 0815 03/27/20 1103   03/27/20 0600  clindamycin (CLEOCIN) IVPB 900 mg        900 mg 100 mL/hr over 30 Minutes Intravenous On call to O.R. 03/26/20 1633 03/27/20 0745   03/25/20 2200  metroNIDAZOLE (FLAGYL) tablet 500 mg        500 mg Oral Every 8 hours 03/25/20 1841     03/25/20 1845  cefTRIAXone (ROCEPHIN) 2 g in sodium chloride 0.9 % 100 mL IVPB        2 g 200 mL/hr over 30 Minutes Intravenous Every 24 hours 03/25/20 1841     03/25/20 1500  vancomycin (VANCOREADY) IVPB 1500 mg/300 mL        1,500 mg 150 mL/hr over 120 Minutes Intravenous  Once 03/25/20 1426 03/25/20 1744   03/25/20 1430  ceFEPIme (MAXIPIME) 2 g in sodium chloride 0.9 % 100 mL IVPB        2 g 200 mL/hr over 30 Minutes Intravenous  Once 03/25/20 1425 03/25/20 1510   03/25/20 1430  metroNIDAZOLE (FLAGYL) tablet 500  mg        500 mg Oral  Once 03/25/20 1425 03/25/20 1442       Objective   Vitals:   04/02/20 0418 04/02/20 0910 04/02/20 0911 04/02/20 1227  BP: (!) 143/81 (!) 151/90 (!) 151/90 127/73  Pulse: 81 81  80  Resp: 18 20  20   Temp: 98.8 F (37.1 C) 98.9 F (37.2 C)  98.1 F (36.7 C)  TempSrc: Oral Oral  Oral  SpO2: 99% 100%  98%  Weight:      Height:        SpO2: 98 % O2 Flow Rate (L/min): 2 L/min  Wt Readings from Last 3 Encounters:  03/25/20 90.7 kg  03/18/20 91.2 kg  03/06/20 91.4 kg     Intake/Output Summary (Last 24 hours) at 04/02/2020 1503 Last data filed at 04/02/2020 1448 Gross per 24 hour  Intake 1160 ml  Output 1100 ml  Net 60 ml    Physical Exam:     Awake Alert, Oriented X 3, Normal affect No new F.N deficits,  [place].AT, Normal respiratory effort on room air, CTAB RRR,No Gallops,Rubs or new Murmurs,  +ve B.Sounds, Abd Soft, No tenderness, No rebound, guarding or rigidity. Right foot status  post transmetatarsal imitation with dressing in place that is clean, dry, intact.   I have personally reviewed the following:   Data Reviewed:  CBC Recent Labs  Lab 03/29/20 0443 03/30/20 0749 03/31/20 0454 04/01/20 0412 04/02/20 0605  WBC 23.6* 26.0* 24.3* 21.1* 19.3*  HGB 8.4* 8.1* 7.9* 8.2* 8.4*  HCT 27.4* 26.4* 25.4* 26.9* 26.6*  PLT 423* 443* 451* 453* 498*  MCV 89.5 88.9 87.6 90.3 87.5  MCH 27.5 27.3 27.2 27.5 27.6  MCHC 30.7 30.7 31.1 30.5 31.6  RDW 15.2 15.1 15.2 15.3 15.8*  LYMPHSABS  --  2.1 2.1 2.8 2.9  MONOABS  --  1.4* 1.1* 0.9 1.1*  EOSABS  --  0.5 0.5 0.5 0.5  BASOSABS  --  0.1 0.1 0.0 0.1    Chemistries  Recent Labs  Lab 03/29/20 0443 03/30/20 0749 03/31/20 0454 04/01/20 0412 04/02/20 0605  NA 137 133* 140 138 137  K 3.7 3.7 3.8 3.5 3.8  CL 112* 109 115* 112* 111  CO2 17* 17* 18* 18* 19*  GLUCOSE 113* 135* 65* 140* 139*  BUN 36* 29* 29* 23* 20  CREATININE 2.79* 2.45* 2.34* 2.15* 2.28*  CALCIUM 8.2* 8.0* 8.2* 8.0* 7.9*  MG  --  1.7 1.8 1.9 1.7  AST  --  15  --   --  14*  ALT  --  15  --   --  14  ALKPHOS  --  60  --   --  62  BILITOT  --  0.2*  --   --  0.4   ------------------------------------------------------------------------------------------------------------------ No results for input(s): CHOL, HDL, LDLCALC, TRIG, CHOLHDL, LDLDIRECT in the last 72 hours.  Lab Results  Component Value Date   HGBA1C 7.9 (H) 03/25/2020   ------------------------------------------------------------------------------------------------------------------ No results for input(s): TSH, T4TOTAL, T3FREE, THYROIDAB in the last 72 hours.  Invalid input(s): FREET3 ------------------------------------------------------------------------------------------------------------------ No results for input(s): VITAMINB12, FOLATE, FERRITIN, TIBC, IRON, RETICCTPCT in the last 72 hours.  Coagulation profile No results for input(s): INR, PROTIME in the last 168  hours.  No results for input(s): DDIMER in the last 72 hours.  Cardiac Enzymes No results for input(s): CKMB, TROPONINI, MYOGLOBIN in the last 168 hours.  Invalid input(s): CK ------------------------------------------------------------------------------------------------------------------ No results found for: BNP  Micro  Results Recent Results (from the past 240 hour(s))  WOUND CULTURE     Status: None   Collection Time: 03/25/20 11:00 AM   Specimen: Foot; Wound  Result Value Ref Range Status   MICRO NUMBER: [id number]  Final   SPECIMEN QUALITY: Adequate  Final   SOURCE: ULCER RIGHT FOOT  Final   STATUS: FINAL  Final   GRAM STAIN:   Final    Many White blood cells seen Rare epithelial cells Many Gram negative bacilli Many Gram positive cocci in clusters   RESULT:   Final    Growth of skin flora (note: Growth does not include S. aureus, beta-hemolytic Streptococci or P. aeruginosa).  Resp Panel by RT-PCR (Flu A&B, Covid) Nasopharyngeal Swab     Status: None   Collection Time: 03/25/20  2:24 PM   Specimen: Nasopharyngeal Swab; Nasopharyngeal(NP) swabs in vial transport medium  Result Value Ref Range Status   SARS Coronavirus 2 by RT PCR NEGATIVE NEGATIVE Final    Comment: (NOTE) SARS-CoV-2 target nucleic acids are NOT DETECTED.  The SARS-CoV-2 RNA is generally detectable in upper respiratory specimens during the acute phase of infection. The lowest concentration of SARS-CoV-2 viral copies this assay can detect is 138 copies/mL. A negative result does not preclude SARS-Cov-2 infection and should not be used as the sole basis for treatment or other patient management decisions. A negative result may occur with  improper specimen collection/handling, submission of specimen other than nasopharyngeal swab, presence of viral mutation(s) within the areas targeted by this assay, and inadequate number of viral copies(<138 copies/mL). A negative result must be combined with clinical  observations, patient history, and epidemiological information. The expected result is Negative.  Fact Sheet for Patients:  EntrepreneurPulse.com.au  Fact Sheet for Healthcare Providers:  IncredibleEmployment.be  This test is no t yet approved or cleared by the [place] FDA and  has been authorized for detection and/or diagnosis of SARS-CoV-2 by FDA under an Emergency Use Authorization (EUA). This EUA will remain  in effect (meaning this test can be used) for the duration of the COVID-19 declaration under Section 564(b)(1) of the Act, 21 U.S.C.section 360bbb-3(b)(1), unless the authorization is terminated  or revoked sooner.       Influenza A by PCR NEGATIVE NEGATIVE Final   Influenza B by PCR NEGATIVE NEGATIVE Final    Comment: (NOTE) The Xpert Xpress SARS-CoV-2/FLU/RSV plus assay is intended as an aid in the diagnosis of influenza from Nasopharyngeal swab specimens and should not be used as a sole basis for treatment. Nasal washings and aspirates are unacceptable for Xpert Xpress SARS-CoV-2/FLU/RSV testing.  Fact Sheet for Patients: EntrepreneurPulse.com.au  Fact Sheet for Healthcare Providers: IncredibleEmployment.be  This test is not yet approved or cleared by the [place] FDA and has been authorized for detection and/or diagnosis of SARS-CoV-2 by FDA under an Emergency Use Authorization (EUA). This EUA will remain in effect (meaning this test can be used) for the duration of the COVID-19 declaration under Section 564(b)(1) of the Act, 21 U.S.C. section 360bbb-3(b)(1), unless the authorization is terminated or revoked.  Performed at [facility], [place] [address] [place], [place] [address]   Culture, blood (routine x 2)     Status: Abnormal   Collection Time: 03/25/20  3:00 PM   Specimen: BLOOD  Result Value Ref Range Status   Specimen Description   Final    BLOOD  LEFT ANTECUBITAL Performed at [facility] [address] [place], [place] [address]  Special Requests   Final    BOTTLES DRAWN AEROBIC AND ANAEROBIC Blood Culture adequate volume Performed at [facility] [address], [place], [place] [address]    Culture  Setup Time   Final    GRAM POSITIVE COCCI IN CLUSTERS ANAEROBIC BOTTLE ONLY CRITICAL RESULT CALLED TO, READ BACK BY AND VERIFIED WITH: C. SHADE PHARMD, AT 1055 03/30/20 BY D. VANHOOK    Culture (A)  Final    PEPTOSTREPTOCOCCUS ASACCHAROLYTICUS Standardized susceptibility testing for this organism is not available. Performed at [facility], [place] [address] [place], [place] [address]    Report Status 04/02/2020 FINAL  Final  Culture, blood (routine x 2)     Status: None   Collection Time: 03/25/20  3:00 PM   Specimen: BLOOD LEFT HAND  Result Value Ref Range Status   Specimen Description   Final    BLOOD LEFT HAND Performed at [facility] [address], [place], [place] [address]    Special Requests   Final    BOTTLES DRAWN AEROBIC AND ANAEROBIC Blood Culture adequate volume Performed at [facility] [address] [place], [place] [address]    Culture   Final    NO GROWTH 5 DAYS Performed at [facility], [place] [address], [place], [place] [address]    Report Status 03/30/2020 FINAL  Final  Blood Culture ID Panel (Reflexed)     Status: None   Collection Time: 03/25/20  3:00 PM  Result Value Ref Range Status   Enterococcus faecalis NOT DETECTED NOT DETECTED Final   Enterococcus Faecium NOT DETECTED NOT DETECTED Final   Listeria monocytogenes NOT DETECTED NOT DETECTED Final   Staphylococcus species NOT DETECTED NOT DETECTED Final   Staphylococcus aureus (BCID) NOT DETECTED NOT DETECTED Final   Staphylococcus epidermidis NOT DETECTED NOT DETECTED Final   Staphylococcus lugdunensis NOT DETECTED NOT DETECTED Final   Streptococcus  species NOT DETECTED NOT DETECTED Final   Streptococcus agalactiae NOT DETECTED NOT DETECTED Final   Streptococcus pneumoniae NOT DETECTED NOT DETECTED Final   Streptococcus pyogenes NOT DETECTED NOT DETECTED Final   A.calcoaceticus-baumannii NOT DETECTED NOT DETECTED Final   Bacteroides fragilis NOT DETECTED NOT DETECTED Final   Enterobacterales NOT DETECTED NOT DETECTED Final   Enterobacter cloacae complex NOT DETECTED NOT DETECTED Final   Escherichia coli NOT DETECTED NOT DETECTED Final   Klebsiella aerogenes NOT DETECTED NOT DETECTED Final   Klebsiella oxytoca NOT DETECTED NOT DETECTED Final   Klebsiella pneumoniae NOT DETECTED NOT DETECTED Final   Proteus species NOT DETECTED NOT DETECTED Final   Salmonella species NOT DETECTED NOT DETECTED Final   Serratia marcescens NOT DETECTED NOT DETECTED Final   Haemophilus influenzae NOT DETECTED NOT DETECTED Final   Neisseria meningitidis NOT DETECTED NOT DETECTED Final   Pseudomonas aeruginosa NOT DETECTED NOT DETECTED Final   Stenotrophomonas maltophilia NOT DETECTED NOT DETECTED Final   Candida albicans NOT DETECTED NOT DETECTED Final   Candida auris NOT DETECTED NOT DETECTED Final   Candida glabrata NOT DETECTED NOT DETECTED Final   Candida krusei NOT DETECTED NOT DETECTED Final   Candida parapsilosis NOT DETECTED NOT DETECTED Final   Candida tropicalis NOT DETECTED NOT DETECTED Final   Cryptococcus neoformans/gattii NOT DETECTED NOT DETECTED Final    Comment: Performed at [facility], [address] [place], [place] [address]  Aerobic/Anaerobic Culture (surgical/deep wound)     Status: None   Collection Time: 03/25/20  8:12 PM  Specimen: Wound; Tissue  Result Value Ref Range Status   Specimen Description   Final    WOUND Performed at [facility] [address], [place], [place] [address]    Special Requests   Final    NONE Performed at [facility], [place] [address]  [place], [place] [address]    Gram Stain   Final    NO WBC SEEN NO ORGANISMS SEEN Performed at [facility], [place] [address], [place], [place] [address]    Culture   Final    RARE STAPHYLOCOCCUS LUGDUNENSIS MIXED ANAEROBIC FLORA PRESENT.  CALL LAB IF FURTHER IID REQUIRED.    Report Status 04/01/2020 FINAL  Final   Organism ID, Bacteria STAPHYLOCOCCUS LUGDUNENSIS  Final      Susceptibility   Staphylococcus lugdunensis - MIC*    CIPROFLOXACIN <=0.5 SENSITIVE Sensitive     ERYTHROMYCIN >=8 RESISTANT Resistant     GENTAMICIN <=0.5 SENSITIVE Sensitive     OXACILLIN >=4 RESISTANT Resistant     TETRACYCLINE <=1 SENSITIVE Sensitive     VANCOMYCIN <=0.5 SENSITIVE Sensitive     TRIMETH/SULFA <=10 SENSITIVE Sensitive     CLINDAMYCIN >=8 RESISTANT Resistant     RIFAMPIN <=0.5 SENSITIVE Sensitive     Inducible Clindamycin NEGATIVE Sensitive     * RARE STAPHYLOCOCCUS LUGDUNENSIS  Aerobic/Anaerobic Culture (surgical/deep wound)     Status: None   Collection Time: 03/27/20  8:30 AM   Specimen: Foot, Right; Wound  Result Value Ref Range Status   Specimen Description   Final    FOOT RIGHT 5TH METATARSAL Performed at [facility] [address] [place], [place] [address]    Special Requests   Final    NONE Performed at [facility], [place] [address], [place], [place] [address]    Gram Stain NO WBC SEEN NO ORGANISMS SEEN   Final   Culture   Final    No growth aerobically or anaerobically. Performed at [facility], [place] [address] [place], [place] [address]    Report Status 04/01/2020 FINAL  Final  Culture, blood (routine x 2)     Status: None (Preliminary result)   Collection Time: 03/31/20  4:54 AM   Specimen: BLOOD  Result Value Ref Range Status   Specimen Description   Final    BLOOD FOREARM Performed at [facility] [address], [place], [place] [address]    Special Requests   Final    BOTTLES DRAWN AEROBIC ONLY  Blood Culture adequate volume Performed at [facility] [address], [place], [place] [address]    Culture   Final    NO GROWTH 2 DAYS Performed at [facility] [address], [place], [place] [address]    Report Status PENDING  Incomplete  Culture, blood (routine x 2)     Status: None (Preliminary result)   Collection Time: 03/31/20  4:54 AM   Specimen: BLOOD LEFT HAND  Result Value Ref Range Status   Specimen Description   Final    BLOOD LEFT HAND Performed at [facility] [address], [place], [place] [address]    Special Requests   Final    BOTTLES DRAWN AEROBIC ONLY Blood Culture adequate volume Performed at [facility] [address], [place],  [address]    Culture   Final    NO GROWTH 2 DAYS Performed at [facility],  [address], [place], [place] [address]    Report Status PENDING  Incomplete    Radiology Reports DG Abd 2 Views  Result Date: 03/28/2020 CLINICAL DATA:  RIGHT foot wound. EXAM: ABDOMEN - 2 VIEW COMPARISON:  None. FINDINGS: Scattered nonspecific air-fluid levels within the small and large bowel. No dilated large or small bowel loops. No evidence of free intraperitoneal air is seen. No acute appearing osseous abnormality. IMPRESSION: 1. Scattered nonspecific air-fluid levels throughout the abdomen, involving both large and small bowel, suggesting gastroenteritis or enterocolitis of infectious or inflammatory nature. 2. No evidence of bowel obstruction. Electronically Signed   By: Franki Cabot M.D.   On: 03/28/2020 15:40   DG Foot 2 Views Right  Result Date: 03/27/2020 CLINICAL DATA:  Postop transmetatarsal amputation EXAM: RIGHT FOOT - 2 VIEW COMPARISON:  None. FINDINGS: Interval transmetatarsal amputation. No bone destruction. No periosteal reaction. No fracture or dislocation. Postsurgical changes in the overlying soft tissues. Small plantar calcaneal spur. Lateral  wound VAC noted. IMPRESSION: Interval transmetatarsal amputation. Electronically Signed   By: Kathreen Devoid   On: 03/27/2020 10:57   DG Foot 2 Views Right  Result Date: 03/25/2020 CLINICAL DATA:  Transmetatarsal amputation EXAM: RIGHT FOOT - 2 VIEW COMPARISON:  03/19/2020 FINDINGS: Previous amputation at the level of the MTP joint involving the first and third digits. Chronic dorsal displacement of the base of the second proximal phalanx with respect to the head of the metatarsal. Interval amputation of fifth digit at the level of the proximal shaft of the metatarsal. Vascular calcifications. IMPRESSION: 1. Interval amputation of fifth digit at the level of the proximal shaft of the metatarsal. 2. Previous amputation of the first and third digits. Chronic dislocation at the second MTP joint. Electronically Signed   By: Donavan Foil M.D.   On: 03/25/2020 21:26   DG Foot Complete Right  Result Date: 03/25/2020 Please see detailed radiograph report in office note.  DG Foot Complete Right  Result Date: 03/19/2020 Please see detailed radiograph report in office note.  VAS [place] ABI WITH/WO TBI  Result Date: 03/26/2020 LOWER EXTREMITY DOPPLER STUDY Indications: Gangrene, and peripheral artery disease.  Vascular Interventions: Pre-Op TMA 12/30 s/p toe amputations. Performing Technologist: Vonzell Schlatter RVT  Examination Guidelines: A complete evaluation includes at minimum, Doppler waveform signals and systolic blood pressure reading at the level of bilateral brachial, anterior tibial, and posterior tibial arteries, when vessel segments are accessible. Bilateral testing is considered an integral part of a complete examination. Photoelectric Plethysmograph (PPG) waveforms and toe systolic pressure readings are included as required and additional duplex testing as needed. Limited examinations for reoccurring indications may be performed as noted.  ABI Findings:  +---------+------------------+-----+---------+---------------------+ Right    Rt Pressure (mmHg)IndexWaveform Comment               +---------+------------------+-----+---------+---------------------+ Brachial 125                    triphasic                      +---------+------------------+-----+---------+---------------------+ PTA      154               1.23 triphasic                      +---------+------------------+-----+---------+---------------------+ DP       164               1.31 triphasic                      +---------+------------------+-----+---------+---------------------+  Great Toe                                recent toes amputated +---------+------------------+-----+---------+---------------------+ +--------+------------------+-----+---------+---------------+ Left    Lt Pressure (mmHg)IndexWaveform Comment         +--------+------------------+-----+---------+---------------+ Brachial                       triphasicIV and bandages +--------+------------------+-----+---------+---------------+ PTA     121               0.97 triphasic                +--------+------------------+-----+---------+---------------+ DP      140               1.12 triphasic                +--------+------------------+-----+---------+---------------+  Summary: Right: Resting right ankle-brachial index indicates noncompressible right lower extremity arteries. Left: Resting left ankle-brachial index is within normal range. No evidence of significant left lower extremity arterial disease.  *See table(s) above for measurements and observations.  Electronically signed by Jamelle Haring on 03/26/2020 at 5:30:30 PM.   Final    MYOCARDIAL PERFUSION IMAGING  Result Date: 03/18/2020  Nuclear stress EF: 55%.  There was no ST segment deviation noted during stress.  The study is normal.  The left ventricular ejection fraction is normal (55-65%).  This is a low risk study.   Gwyndolyn Kaufman, MD   ECHOCARDIOGRAM COMPLETE  Result Date: 03/31/2020    ECHOCARDIOGRAM REPORT   Patient Name:   [name] Date of Exam: 03/31/2020 Medical Rec #:  [id number]        Height:       64.0 in Accession #:    [id number]       Weight:       200.0 lb Date of Birth:  08/01/1970        BSA:          1.956 m Patient Age:    28 years         BP:           141/76 mmHg Patient Gender: M                HR:           77 bpm. Exam Location:  Inpatient Procedure: 2D Echo, Cardiac Doppler and Color Doppler Indications:    Bacteremia  History:        Patient has prior history of Echocardiogram examinations, most                 recent 01/22/2020. Signs/Symptoms:Bacteremia; Risk                 Factors:Diabetes, Current Smoker and Hypertension.  Sonographer:    Roseanna Rainbow RDCS Referring Phys: [id number] [facility]  Sonographer Comments: Technically difficult study due to poor echo windows. IMPRESSIONS  1. Left ventricular ejection fraction, by estimation, is 60 to 65%. Left ventricular ejection fraction by 2D MOD biplane is 64.1 %. Left ventricular ejection fraction by PLAX is 65 %. The left ventricle has normal function. The left ventricle has no regional wall motion abnormalities. There is mild concentric left ventricular hypertrophy. Left ventricular diastolic parameters are consistent with Grade I diastolic dysfunction (impaired relaxation).  2. Right ventricular systolic function is normal. The right ventricular size is normal.  3. The pericardial effusion  is circumferential and posterior to the left ventricle. There is no evidence of cardiac tamponade.  4. The mitral valve is normal in structure. No evidence of mitral valve regurgitation. No evidence of mitral stenosis.  5. The aortic valve was not well visualized. Aortic valve regurgitation is not visualized. No aortic stenosis is present.  6. The inferior vena cava is normal in size with greater than 50% respiratory variability, suggesting right atrial  pressure of 3 mmHg. FINDINGS  Left Ventricle: Left ventricular ejection fraction, by estimation, is 60 to 65%. Left ventricular ejection fraction by PLAX is 65 %. Left ventricular ejection fraction by 2D MOD biplane is 64.1 %. The left ventricle has normal function. The left ventricle has no regional wall motion abnormalities. The left ventricular internal cavity size was normal in size. There is mild concentric left ventricular hypertrophy. Left ventricular diastolic parameters are consistent with Grade I diastolic dysfunction (impaired relaxation). Indeterminate filling pressures. Right Ventricle: The right ventricular size is normal. No increase in right ventricular wall thickness. Right ventricular systolic function is normal. Left Atrium: Left atrial size was normal in size. Right Atrium: Right atrial size was normal in size. Pericardium: Trivial pericardial effusion is present. The pericardial effusion is circumferential and posterior to the left ventricle. There is no evidence of cardiac tamponade. Mitral Valve: The mitral valve is normal in structure. No evidence of mitral valve regurgitation. No evidence of mitral valve stenosis. Tricuspid Valve: The tricuspid valve is normal in structure. Tricuspid valve regurgitation is trivial. No evidence of tricuspid stenosis. Aortic Valve: The aortic valve was not well visualized. Aortic valve regurgitation is not visualized. No aortic stenosis is present. Pulmonic Valve: The pulmonic valve was normal in structure. Pulmonic valve regurgitation is not visualized. No evidence of pulmonic stenosis. Aorta: The aortic root is normal in size and structure. Venous: The inferior vena cava is normal in size with greater than 50% respiratory variability, suggesting right atrial pressure of 3 mmHg. IAS/Shunts: No atrial level shunt detected by color flow Doppler.  LEFT VENTRICLE PLAX 2D                        Biplane EF (MOD) LV EF:         Left            LV Biplane EF:   Left                 ventricular                      ventricular                ejection                         ejection                fraction by                      fraction by                PLAX is 65                       2D MOD                %.  biplane is LVIDd:         3.70 cm                          64.1 %. LVIDs:         2.40 cm LV PW:         1.33 cm         Diastology LV IVS:        1.17 cm         LV e' medial:    9.46 cm/s LVOT diam:     1.90 cm         LV E/e' medial:  10.1 LV SV:         69              LV e' lateral:   8.38 cm/s LV SV Index:   35              LV E/e' lateral: 11.4 LVOT Area:     2.84 cm  LV Volumes (MOD) LV vol d, MOD    93.7 ml A2C: LV vol d, MOD    75.8 ml A4C: LV vol s, MOD    30.8 ml A2C: LV vol s, MOD    27.8 ml A4C: LV SV MOD A2C:   62.9 ml LV SV MOD A4C:   75.8 ml LV SV MOD BP:    54.9 ml RIGHT VENTRICLE             IVC RV S prime:     12.00 cm/s  IVC diam: 1.50 cm TAPSE (M-mode): 2.1 cm LEFT ATRIUM           Index       RIGHT ATRIUM          Index LA diam:      3.70 cm 1.89 cm/m  RA Area:     9.46 cm LA Vol (A2C): 40.6 ml 20.75 ml/m RA Volume:   17.70 ml 9.05 ml/m LA Vol (A4C): 26.4 ml 13.49 ml/m  AORTIC VALVE LVOT Vmax:   111.00 cm/s LVOT Vmean:  75.400 cm/s LVOT VTI:    0.243 m  AORTA Ao Root diam: 3.20 cm MITRAL VALVE MV Area (PHT): 3.53 cm    SHUNTS MV Decel Time: 215 msec    Systemic VTI:  0.24 m MV E velocity: 95.50 cm/s  Systemic Diam: 1.90 cm MV A velocity: 81.00 cm/s MV E/A ratio:  1.18 Eleonore Chiquito MD Electronically signed by Eleonore Chiquito MD Signature Date/Time: 03/31/2020/3:17:08 PM    Final      Time Spent in minutes  30     Desiree Hane M.D on 04/02/2020 at 3:03 PM  To page go to [email or web address] - password [facility]

## 2020-04-03 DIAGNOSIS — E0843 Diabetes mellitus due to underlying condition with diabetic autonomic (poly)neuropathy: Secondary | ICD-10-CM | POA: Diagnosis not present

## 2020-04-03 DIAGNOSIS — M86171 Other acute osteomyelitis, right ankle and foot: Secondary | ICD-10-CM | POA: Diagnosis not present

## 2020-04-03 DIAGNOSIS — L97422 Non-pressure chronic ulcer of left heel and midfoot with fat layer exposed: Secondary | ICD-10-CM

## 2020-04-03 DIAGNOSIS — E11621 Type 2 diabetes mellitus with foot ulcer: Secondary | ICD-10-CM | POA: Diagnosis not present

## 2020-04-03 DIAGNOSIS — D72829 Elevated white blood cell count, unspecified: Secondary | ICD-10-CM | POA: Diagnosis not present

## 2020-04-03 LAB — BASIC METABOLIC PANEL
Anion gap: 10 (ref 5–15)
BUN: 21 mg/dL — ABNORMAL HIGH (ref 6–20)
CO2: 17 mmol/L — ABNORMAL LOW (ref 22–32)
Calcium: 8.2 mg/dL — ABNORMAL LOW (ref 8.9–10.3)
Chloride: 111 mmol/L (ref 98–111)
Creatinine, Ser: 2.48 mg/dL — ABNORMAL HIGH (ref 0.61–1.24)
GFR, Estimated: 31 mL/min — ABNORMAL LOW (ref >60)
Glucose, Bld: 90 mg/dL (ref 70–99)
Potassium: 3.9 mmol/L (ref 3.5–5.1)
Sodium: 138 mmol/L (ref 135–145)

## 2020-04-03 LAB — GLUCOSE, CAPILLARY
Glucose-Capillary: 139 mg/dL — ABNORMAL HIGH (ref 70–99)
Glucose-Capillary: 90 mg/dL (ref 70–99)
Glucose-Capillary: 143 mg/dL — ABNORMAL HIGH (ref 70–99)

## 2020-04-03 LAB — CBC
HCT: 27.2 % — ABNORMAL LOW (ref 39.0–52.0)
Hemoglobin: 8.5 g/dL — ABNORMAL LOW (ref 13.0–17.0)
MCH: 28 pg (ref 26.0–34.0)
MCHC: 31.3 g/dL (ref 30.0–36.0)
MCV: 89.5 fL (ref 80.0–100.0)
Platelets: 484 10*3/uL — ABNORMAL HIGH (ref 150–400)
RBC: 3.04 MIL/uL — ABNORMAL LOW (ref 4.22–5.81)
RDW: 16 % — ABNORMAL HIGH (ref 11.5–15.5)
WBC: 19.2 10*3/uL — ABNORMAL HIGH (ref 4.0–10.5)
nRBC: 0 % (ref 0.0–0.2)

## 2020-04-03 MED ORDER — ADULT MULTIVITAMIN W/MINERALS CH
1.0000 | ORAL_TABLET | ORAL | Status: DC
  Administered 2020-04-03 – 2020-04-08 (×6): 1 via ORAL
  Filled 2020-04-03 (×6): qty 1

## 2020-04-03 MED ORDER — ENSURE MAX PROTEIN PO LIQD
11.0000 [oz_av] | Freq: Every day | ORAL | Status: DC
  Administered 2020-04-03: 11 [oz_av] via ORAL
  Filled 2020-04-03 (×6): qty 330

## 2020-04-03 NOTE — Plan of Care (Signed)
  Problem: Education: Goal: Knowledge of General Education information will improve Description: Including pain rating scale, medication(s)/side effects and non-pharmacologic comfort measures Outcome: Progressing   Problem: Clinical Measurements: Goal: Respiratory complications will improve Outcome: Progressing Goal: Cardiovascular complication will be avoided Outcome: Progressing   Problem: Nutrition: Goal: Adequate nutrition will be maintained Outcome: Progressing   Problem: Coping: Goal: Level of anxiety will decrease Outcome: Progressing   Problem: Elimination: Goal: Will not experience complications related to urinary retention Outcome: Progressing   Problem: Pain Managment: Goal: General experience of comfort will improve Outcome: Progressing   Problem: Safety: Goal: Ability to remain free from injury will improve Outcome: Progressing   Problem: Skin Integrity: Goal: Risk for impaired skin integrity will decrease Outcome: Progressing

## 2020-04-03 NOTE — Progress Notes (Signed)
Patient up to [facility], voided approximately 50 mls yellow urine and had a small soft BM.  Placed patient back in bed, performed bladder scanner which read as 640 mls in bladder.  Notified MD, in and out cath performed as per protocol.  Obtained 500 mls yellow urine per cath.  This information will be passed on to night shift to monitor for retention.

## 2020-04-03 NOTE — Progress Notes (Signed)
Foley catheter discontinued at 0900, patient has not voided since removal.  Has no urge to void.  Bladder scan performed, read as 120 mls.  Patient encouraged to drink.  Will continue to monitor.

## 2020-04-03 NOTE — Progress Notes (Signed)
[facility]  PROGRESS NOTE  [name] ZOX:[id number] DOB: 1970-05-19 DOA: 03/25/2020 PCP: Gildardo Pounds, NP Admit date - 03/25/2020   Admitting Physician Lavina Hamman, MD  Outpatient Primary MD for the patient is Gildardo Pounds, NP  LOS - 9 Brief Narrative   [name] is a 50 y.o. year old male with medical history significant for Type 2 diabetes, HTN, CKD stage III, chronic right foot wound presenting with worsening right foot pain for the past week and after palpation evaluation by his podiatrist was directed to the ED due to concern for right foot ulcer with osteomyelitis and gas gangrene based off wound evaluation) necrotic, red and, purulent drainage) and x-ray (outpatient) presents with osteomyelitis of fifth metatarsal with multifocal subcutaneous emphysema. He underwent emergent operative debridement on 12/30 with transmetatarsal amputation. During hospital course he is remained on vancomycin, ceftriaxone, and Flagyl for Peptostreptococcus bacteremia (1 of 2 blood cultures 03/25/2020) and Staphylococcus lugdunensis surgical wound culture.   Subjective  Anxious to go home.  States he has been having loose stool with occasional abdominal pain.  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 Remains afebrile, white count slowly downtrending, repeat blood cultures unremarkable. Suspect polymicrobial, surgical culture positive for Staphylococcus lugdunensis -Doxycycline for staphylococcal bacteremia, discontinue vancomycin on 1/5 -Continue p.o. Flagyl for Peptostreptococcus, discontinued ceftriaxone on 1/5 -Plan to continue oral antibiotics for 2 weeks for soft tissue infection right foot -PT  recommends SNF continue monitor CBC for downtrend depending resuming wound VAC, and will need home care for [facility].  Patient does not want to go to SNF  Peptostreptococcus bacteremia. Repeat blood cultures on 1/30 remain negative. TTE with no vegetations -Continue to monitor on transition to doxycycline  Leukocytosis, slowly downtrending.  19.2 today essentially unchanged from 2.3 yesterday.  Remains afebrile.  No localizing signs or symptoms of infection. - Nursing will monitor stool to ensure Bristol size not consistent with C. difficile - Continue closely monitor CBC  Type 2 diabetes with peripheral neuropathy and hyperlipidemia. A1c 7.9. Glucose remains well controlled here -Continue Lantus 5 units, sliding scale insulin as needed -Continue gabapentin-continue statin  Pericardial effusion. Incidentally noted on TTE from 1/3. Trivial in size per report circumferential with no evidence of cardiac tamponade. Symptomatically has no dyspnea, no chest pain, no tachycardia -Closely monitor clinically  Chronic normocytic anemia, stable. Has history of iron deficiency anemia, takes iron at home. Slightly worse from baseline, likely related to postoperative changes. Previous baseline 9-10, currently 7.9-8.4. No episodes of bleeding-monitor CBC  CKD stage III, stable. Creatinine stable at baseline -Avoid nephrotoxins, monitor output  Hypertension at goal -Continue amlodipine, Coreg, clonidine -Home lisinopril held on admission  Mood disorder, stable-continue Lexapro, Wellbutrin  HLD, stable-continue Lipitor, aspirin    Family Communication  : None  Code Status : Full  Disposition Plan  :  Patient is from home. Anticipated d/c date: 1-2 days. Barriers to d/c or necessity for inpatient status:  Ensure stability/improvement in leukocytosis. PT recommends skilled nursing facility but patient likes to stay home, will arrange outpatient PT Consults  : Podiatry  Procedures  : Transmetatarsal  amputation 12/30  DVT Prophylaxis  :  Heparin  MDM: The below labs and imaging reports were reviewed and summarized above.  Medication management  as above.  Lab Results  Component Value Date   PLT 484 (H) 04/03/2020    Diet :  Diet Order            Diet heart healthy/carb modified Room service appropriate? Yes; Fluid consistency: Thin  Diet effective now                  Inpatient Medications Scheduled Meds: . amLODipine  10 mg Oral Daily  . aspirin EC  81 mg Oral Daily  . atorvastatin  20 mg Oral Daily  . B-complex with vitamin C  1 tablet Oral Daily  . buPROPion  150 mg Oral Daily  . carvedilol  25 mg Oral BID  . Chlorhexidine Gluconate Cloth  6 each Topical UD  . cloNIDine  0.1 mg Oral BID  . doxycycline  100 mg Oral Q12H  . escitalopram  10 mg Oral QHS  . ferrous sulfate  325 mg Oral Daily  . gabapentin  300 mg Oral TID  . heparin  5,000 Units Subcutaneous Q8H  . insulin aspart  0-6 Units Subcutaneous TID WC  . insulin glargine  5 Units Subcutaneous Daily  . metroNIDAZOLE  500 mg Oral Q8H  . multivitamin with minerals  1 tablet Oral Q24H  . nicotine  7 mg Transdermal Daily  . Ensure Max Protein  11 oz Oral QHS   Continuous Infusions: . methocarbamol (ROBAXIN) IV     PRN Meds:.acetaminophen **OR** acetaminophen, hydrALAZINE, HYDROmorphone (DILAUDID) injection, melatonin, methocarbamol (ROBAXIN) IV, ondansetron **OR** ondansetron (ZOFRAN) IV, oxyCODONE, prochlorperazine  Antibiotics  :   Anti-infectives (From admission, onward)   Start     Dose/Rate Route Frequency Ordered Stop   04/02/20 2200  doxycycline (VIBRA-TABS) tablet 100 mg        100 mg Oral Every 12 hours 04/02/20 1405     03/31/20 1000  vancomycin (VANCOCIN) IVPB 1000 mg/200 mL premix  Status:  Discontinued        1,000 mg 200 mL/hr over 60 Minutes Intravenous Every 36 hours 03/31/20 0914 04/02/20 1405   03/29/20 1800  vancomycin (VANCOCIN) IVPB 1000 mg/200 mL premix  Status:  Discontinued         1,000 mg 200 mL/hr over 60 Minutes Intravenous Every 48 hours 03/28/20 1012 03/31/20 0914   03/27/20 1500  vancomycin (VANCOREADY) IVPB 1250 mg/250 mL  Status:  Discontinued        1,250 mg 166.7 mL/hr over 90 Minutes Intravenous Every 48 hours 03/25/20 2020 03/28/20 1012   03/27/20 0815  vancomycin (VANCOCIN) powder  Status:  Discontinued          As needed 03/27/20 0815 03/27/20 1103   03/27/20 0600  clindamycin (CLEOCIN) IVPB 900 mg        900 mg 100 mL/hr over 30 Minutes Intravenous On call to O.R. 03/26/20 1633 03/27/20 0745   03/25/20 2200  metroNIDAZOLE (FLAGYL) tablet 500 mg        500 mg Oral Every 8 hours 03/25/20 1841     03/25/20 1845  cefTRIAXone (ROCEPHIN) 2 g in sodium chloride 0.9 % 100 mL IVPB  Status:  Discontinued        2 g 200 mL/hr over 30 Minutes Intravenous Every 24 hours 03/25/20 1841 04/02/20 1508   03/25/20 1500  vancomycin (VANCOREADY) IVPB 1500 mg/300 mL        1,500 mg 150 mL/hr over 120 Minutes Intravenous  Once 03/25/20 1426 03/25/20 1744   03/25/20 1430  ceFEPIme (MAXIPIME) 2 g  in sodium chloride 0.9 % 100 mL IVPB        2 g 200 mL/hr over 30 Minutes Intravenous  Once 03/25/20 1425 03/25/20 1510   03/25/20 1430  metroNIDAZOLE (FLAGYL) tablet 500 mg        500 mg Oral  Once 03/25/20 1425 03/25/20 1442       Objective   Vitals:   04/02/20 1227 04/02/20 2036 04/03/20 0601 04/03/20 1441  BP: 127/73 137/87 (!) 143/85 140/82  Pulse: 80 73 78 78  Resp: 20 20 18 20   Temp: 98.1 F (36.7 C) 97.6 F (36.4 C) 98.6 F (37 C) 98 F (36.7 C)  TempSrc: Oral Oral Oral Oral  SpO2: 98% 100% 99% 99%  Weight:      Height:        SpO2: 99 % O2 Flow Rate (L/min): 2 L/min  Wt Readings from Last 3 Encounters:  03/25/20 90.7 kg  03/18/20 91.2 kg  03/06/20 91.4 kg     Intake/Output Summary (Last 24 hours) at 04/03/2020 1621 Last data filed at 04/03/2020 5361 Gross per 24 hour  Intake 480 ml  Output 725 ml  Net -245 ml    Physical  Exam:     Awake Alert, Oriented X 3, Normal affect No new F.N deficits,  [place].AT, Normal respiratory effort on room air, CTAB RRR,No Gallops,Rubs or new Murmurs,  +ve B.Sounds, Abd Soft, No tenderness, No rebound, guarding or rigidity. Right foot status post transmetatarsal imitation with dressing in place that is clean, dry, intact.  And wound VAC in place   I have personally reviewed the following:   Data Reviewed:  CBC Recent Labs  Lab 03/30/20 0749 03/31/20 0454 04/01/20 0412 04/02/20 0605 04/03/20 0537  WBC 26.0* 24.3* 21.1* 19.3* 19.2*  HGB 8.1* 7.9* 8.2* 8.4* 8.5*  HCT 26.4* 25.4* 26.9* 26.6* 27.2*  PLT 443* 451* 453* 498* 484*  MCV 88.9 87.6 90.3 87.5 89.5  MCH 27.3 27.2 27.5 27.6 28.0  MCHC 30.7 31.1 30.5 31.6 31.3  RDW 15.1 15.2 15.3 15.8* 16.0*  LYMPHSABS 2.1 2.1 2.8 2.9  --   MONOABS 1.4* 1.1* 0.9 1.1*  --   EOSABS 0.5 0.5 0.5 0.5  --   BASOSABS 0.1 0.1 0.0 0.1  --     Chemistries  Recent Labs  Lab 03/30/20 0749 03/31/20 0454 04/01/20 0412 04/02/20 0605 04/03/20 0537  NA 133* 140 138 137 138  K 3.7 3.8 3.5 3.8 3.9  CL 109 115* 112* 111 111  CO2 17* 18* 18* 19* 17*  GLUCOSE 135* 65* 140* 139* 90  BUN 29* 29* 23* 20 21*  CREATININE 2.45* 2.34* 2.15* 2.28* 2.48*  CALCIUM 8.0* 8.2* 8.0* 7.9* 8.2*  MG 1.7 1.8 1.9 1.7  --   AST 15  --   --  14*  --   ALT 15  --   --  14  --   ALKPHOS 60  --   --  62  --   BILITOT 0.2*  --   --  0.4  --    ------------------------------------------------------------------------------------------------------------------ No results for input(s): CHOL, HDL, LDLCALC, TRIG, CHOLHDL, LDLDIRECT in the last 72 hours.  Lab Results  Component Value Date   HGBA1C 7.9 (H) 03/25/2020   ------------------------------------------------------------------------------------------------------------------ No results for input(s): TSH, T4TOTAL, T3FREE, THYROIDAB in the last 72 hours.  Invalid input(s):  FREET3 ------------------------------------------------------------------------------------------------------------------ No results for input(s): VITAMINB12, FOLATE, FERRITIN, TIBC, IRON, RETICCTPCT in the last 72 hours.  Coagulation profile No results for input(s):  INR, PROTIME in the last 168 hours.  No results for input(s): DDIMER in the last 72 hours.  Cardiac Enzymes No results for input(s): CKMB, TROPONINI, MYOGLOBIN in the last 168 hours.  Invalid input(s): CK ------------------------------------------------------------------------------------------------------------------ No results found for: BNP  Micro Results Recent Results (from the past 240 hour(s))  WOUND CULTURE     Status: None   Collection Time: 03/25/20 11:00 AM   Specimen: Foot; Wound  Result Value Ref Range Status   MICRO NUMBER: [id number]  Final   SPECIMEN QUALITY: Adequate  Final   SOURCE: ULCER RIGHT FOOT  Final   STATUS: FINAL  Final   GRAM STAIN:   Final    Many White blood cells seen Rare epithelial cells Many Gram negative bacilli Many Gram positive cocci in clusters   RESULT:   Final    Growth of skin flora (note: Growth does not include S. aureus, beta-hemolytic Streptococci or P. aeruginosa).  Resp Panel by RT-PCR (Flu A&B, Covid) Nasopharyngeal Swab     Status: None   Collection Time: 03/25/20  2:24 PM   Specimen: Nasopharyngeal Swab; Nasopharyngeal(NP) swabs in vial transport medium  Result Value Ref Range Status   SARS Coronavirus 2 by RT PCR NEGATIVE NEGATIVE Final    Comment: (NOTE) SARS-CoV-2 target nucleic acids are NOT DETECTED.  The SARS-CoV-2 RNA is generally detectable in upper respiratory specimens during the acute phase of infection. The lowest concentration of SARS-CoV-2 viral copies this assay can detect is 138 copies/mL. A negative result does not preclude SARS-Cov-2 infection and should not be used as the sole basis for treatment or other patient management decisions. A  negative result may occur with  improper specimen collection/handling, submission of specimen other than nasopharyngeal swab, presence of viral mutation(s) within the areas targeted by this assay, and inadequate number of viral copies(<138 copies/mL). A negative result must be combined with clinical observations, patient history, and epidemiological information. The expected result is Negative.  Fact Sheet for Patients:  EntrepreneurPulse.com.au  Fact Sheet for Healthcare Providers:  IncredibleEmployment.be  This test is no t yet approved or cleared by the [place] FDA and  has been authorized for detection and/or diagnosis of SARS-CoV-2 by FDA under an Emergency Use Authorization (EUA). This EUA will remain  in effect (meaning this test can be used) for the duration of the COVID-19 declaration under Section 564(b)(1) of the Act, 21 U.S.C.section 360bbb-3(b)(1), unless the authorization is terminated  or revoked sooner.       Influenza A by PCR NEGATIVE NEGATIVE Final   Influenza B by PCR NEGATIVE NEGATIVE Final    Comment: (NOTE) The Xpert Xpress SARS-CoV-2/FLU/RSV plus assay is intended as an aid in the diagnosis of influenza from Nasopharyngeal swab specimens and should not be used as a sole basis for treatment. Nasal washings and aspirates are unacceptable for Xpert Xpress SARS-CoV-2/FLU/RSV testing.  Fact Sheet for Patients: EntrepreneurPulse.com.au  Fact Sheet for Healthcare Providers: IncredibleEmployment.be  This test is not yet approved or cleared by the [place] FDA and has been authorized for detection and/or diagnosis of SARS-CoV-2 by FDA under an Emergency Use Authorization (EUA). This EUA will remain in effect (meaning this test can be used) for the duration of the COVID-19 declaration under Section 564(b)(1) of the Act, 21 U.S.C. section 360bbb-3(b)(1), unless the authorization  is terminated or revoked.  Performed at [facility], [place] [address] [place], [place] [address]   Culture, blood (routine x 2)     Status: Abnormal  Collection Time: 03/25/20  3:00 PM   Specimen: BLOOD  Result Value Ref Range Status   Specimen Description   Final    BLOOD LEFT ANTECUBITAL Performed at [facility] [address], [place], [place] [address]    Special Requests   Final    BOTTLES DRAWN AEROBIC AND ANAEROBIC Blood Culture adequate volume Performed at [facility] [address] [place], [place] [address]    Culture  Setup Time   Final    GRAM POSITIVE COCCI IN CLUSTERS ANAEROBIC BOTTLE ONLY CRITICAL RESULT CALLED TO, READ BACK BY AND VERIFIED WITH: C. SHADE PHARMD, AT 1055 03/30/20 BY D. VANHOOK    Culture (A)  Final    PEPTOSTREPTOCOCCUS ASACCHAROLYTICUS Standardized susceptibility testing for this organism is not available. Performed at [facility], [name] [address], [place], [place] [address]    Report Status 04/02/2020 FINAL  Final  Culture, blood (routine x 2)     Status: None   Collection Time: 03/25/20  3:00 PM   Specimen: BLOOD LEFT HAND  Result Value Ref Range Status   Specimen Description   Final    BLOOD LEFT HAND Performed at [facility] [address] [place], [place] [address]    Special Requests   Final    BOTTLES DRAWN AEROBIC AND ANAEROBIC Blood Culture adequate volume Performed at [facility] [address] [place], [place] [address]    Culture   Final    NO GROWTH 5 DAYS Performed at [facility], [place] [address], [place], [place] [address]    Report Status 03/30/2020 FINAL  Final  Blood Culture ID Panel (Reflexed)     Status: None   Collection Time: 03/25/20  3:00 PM  Result Value Ref Range Status   Enterococcus faecalis NOT DETECTED NOT DETECTED Final   Enterococcus Faecium NOT DETECTED NOT DETECTED Final   Listeria  monocytogenes NOT DETECTED NOT DETECTED Final   Staphylococcus species NOT DETECTED NOT DETECTED Final   Staphylococcus aureus (BCID) NOT DETECTED NOT DETECTED Final   Staphylococcus epidermidis NOT DETECTED NOT DETECTED Final   Staphylococcus lugdunensis NOT DETECTED NOT DETECTED Final   Streptococcus species NOT DETECTED NOT DETECTED Final   Streptococcus agalactiae NOT DETECTED NOT DETECTED Final   Streptococcus pneumoniae NOT DETECTED NOT DETECTED Final   Streptococcus pyogenes NOT DETECTED NOT DETECTED Final   A.calcoaceticus-baumannii NOT DETECTED NOT DETECTED Final   Bacteroides fragilis NOT DETECTED NOT DETECTED Final   Enterobacterales NOT DETECTED NOT DETECTED Final   Enterobacter cloacae complex NOT DETECTED NOT DETECTED Final   Escherichia coli NOT DETECTED NOT DETECTED Final   Klebsiella aerogenes NOT DETECTED NOT DETECTED Final   Klebsiella oxytoca NOT DETECTED NOT DETECTED Final   Klebsiella pneumoniae NOT DETECTED NOT DETECTED Final   Proteus species NOT DETECTED NOT DETECTED Final   Salmonella species NOT DETECTED NOT DETECTED Final   Serratia marcescens NOT DETECTED NOT DETECTED Final   Haemophilus influenzae NOT DETECTED NOT DETECTED Final   Neisseria meningitidis NOT DETECTED NOT DETECTED Final   Pseudomonas aeruginosa NOT DETECTED NOT DETECTED Final   Stenotrophomonas maltophilia NOT DETECTED NOT DETECTED Final   Candida albicans NOT DETECTED NOT DETECTED Final   Candida auris NOT DETECTED NOT DETECTED Final   Candida glabrata NOT DETECTED NOT DETECTED Final   Candida krusei NOT DETECTED NOT DETECTED Final   Candida parapsilosis NOT DETECTED NOT DETECTED Final   Candida tropicalis NOT DETECTED NOT DETECTED Final  Cryptococcus neoformans/gattii NOT DETECTED NOT DETECTED Final    Comment: Performed at [facility], [place] [address], [place], [name] [address]  Aerobic/Anaerobic Culture (surgical/deep wound)     Status: None   Collection Time: 03/25/20  8:12 PM    Specimen: Wound; Tissue  Result Value Ref Range Status   Specimen Description   Final    WOUND Performed at [facility] [address], [place], [place] [address]    Special Requests   Final    NONE Performed at [facility], [place] [address], [place], [place] [address]    Gram Stain   Final    NO WBC SEEN NO ORGANISMS SEEN Performed at [facility], [place] [address] [place] [address], [place], [place] [address]    Culture   Final    RARE STAPHYLOCOCCUS LUGDUNENSIS MIXED ANAEROBIC FLORA PRESENT.  CALL LAB IF FURTHER IID REQUIRED.    Report Status 04/01/2020 FINAL  Final   Organism ID, Bacteria STAPHYLOCOCCUS LUGDUNENSIS  Final      Susceptibility   Staphylococcus lugdunensis - MIC*    CIPROFLOXACIN <=0.5 SENSITIVE Sensitive     ERYTHROMYCIN >=8 RESISTANT Resistant     GENTAMICIN <=0.5 SENSITIVE Sensitive     OXACILLIN >=4 RESISTANT Resistant     TETRACYCLINE <=1 SENSITIVE Sensitive     VANCOMYCIN <=0.5 SENSITIVE Sensitive     TRIMETH/SULFA <=10 SENSITIVE Sensitive     CLINDAMYCIN >=8 RESISTANT Resistant     RIFAMPIN <=0.5 SENSITIVE Sensitive     Inducible Clindamycin NEGATIVE Sensitive     * RARE STAPHYLOCOCCUS LUGDUNENSIS  Aerobic/Anaerobic Culture (surgical/deep wound)     Status: None   Collection Time: 03/27/20  8:30 AM   Specimen: Foot, Right; Wound  Result Value Ref Range Status   Specimen Description   Final    FOOT RIGHT 5TH METATARSAL Performed at [facility] [address], [place], [place] [address]    Special Requests   Final    NONE Performed at [facility], [place] [address], [place], [place] [address]    Gram Stain NO WBC SEEN NO ORGANISMS SEEN   Final   Culture   Final    No growth aerobically or anaerobically. Performed at [facility], [place] [address] [place], [place] [address]    Report Status 04/01/2020 FINAL  Final  Culture, blood (routine x 2)     Status: None  (Preliminary result)   Collection Time: 03/31/20  4:54 AM   Specimen: BLOOD  Result Value Ref Range Status   Specimen Description   Final    BLOOD FOREARM Performed at [facility] [address] [place], [place] [address]    Special Requests   Final    BOTTLES DRAWN AEROBIC ONLY Blood Culture adequate volume Performed at [facility] [address] [place], [place] [address]    Culture   Final    NO GROWTH 3 DAYS Performed at [facility], [place] [address] [place], [place] [address]    Report Status PENDING  Incomplete  Culture, blood (routine x 2)     Status: None (Preliminary result)   Collection Time: 03/31/20  4:54 AM   Specimen: BLOOD LEFT HAND  Result Value Ref Range Status   Specimen Description   Final    BLOOD LEFT HAND Performed at [facility] [address], [place],  [address]    Special Requests  Final    BOTTLES DRAWN AEROBIC ONLY Blood Culture adequate volume Performed at [facility] [address] [place], [place] [address]    Culture   Final    NO GROWTH 3 DAYS Performed at [facility], [place] [address], [place], [place] [address]    Report Status PENDING  Incomplete    Radiology Reports DG Abd 2 Views  Result Date: 03/28/2020 CLINICAL DATA:  RIGHT foot wound. EXAM: ABDOMEN - 2 VIEW COMPARISON:  None. FINDINGS: Scattered nonspecific air-fluid levels within the small and large bowel. No dilated large or small bowel loops. No evidence of free intraperitoneal air is seen. No acute appearing osseous abnormality. IMPRESSION: 1. Scattered nonspecific air-fluid levels throughout the abdomen, involving both large and small bowel, suggesting gastroenteritis or enterocolitis of infectious or inflammatory nature. 2. No evidence of bowel obstruction. Electronically Signed   By: Franki Cabot M.D.   On: 03/28/2020 15:40   DG Foot 2 Views Right  Result Date:  03/27/2020 CLINICAL DATA:  Postop transmetatarsal amputation EXAM: RIGHT FOOT - 2 VIEW COMPARISON:  None. FINDINGS: Interval transmetatarsal amputation. No bone destruction. No periosteal reaction. No fracture or dislocation. Postsurgical changes in the overlying soft tissues. Small plantar calcaneal spur. Lateral wound VAC noted. IMPRESSION: Interval transmetatarsal amputation. Electronically Signed   By: Kathreen Devoid   On: 03/27/2020 10:57   DG Foot 2 Views Right  Result Date: 03/25/2020 CLINICAL DATA:  Transmetatarsal amputation EXAM: RIGHT FOOT - 2 VIEW COMPARISON:  03/19/2020 FINDINGS: Previous amputation at the level of the MTP joint involving the first and third digits. Chronic dorsal displacement of the base of the second proximal phalanx with respect to the head of the metatarsal. Interval amputation of fifth digit at the level of the proximal shaft of the metatarsal. Vascular calcifications. IMPRESSION: 1. Interval amputation of fifth digit at the level of the proximal shaft of the metatarsal. 2. Previous amputation of the first and third digits. Chronic dislocation at the second MTP joint. Electronically Signed   By: Donavan Foil M.D.   On: 03/25/2020 21:26   DG Foot Complete Right  Result Date: 03/25/2020 Please see detailed radiograph report in office note.  DG Foot Complete Right  Result Date: 03/19/2020 Please see detailed radiograph report in office note.  VAS [place] ABI WITH/WO TBI  Result Date: 03/26/2020 LOWER EXTREMITY DOPPLER STUDY Indications: Gangrene, and peripheral artery disease.  Vascular Interventions: Pre-Op TMA 12/30 s/p toe amputations. Performing Technologist: Vonzell Schlatter RVT  Examination Guidelines: A complete evaluation includes at minimum, Doppler waveform signals and systolic blood pressure reading at the level of bilateral brachial, anterior tibial, and posterior tibial arteries, when vessel segments are accessible. Bilateral testing is considered an integral  part of a complete examination. Photoelectric Plethysmograph (PPG) waveforms and toe systolic pressure readings are included as required and additional duplex testing as needed. Limited examinations for reoccurring indications may be performed as noted.  ABI Findings: +---------+------------------+-----+---------+---------------------+ Right    Rt Pressure (mmHg)IndexWaveform Comment               +---------+------------------+-----+---------+---------------------+ Brachial 125                    triphasic                      +---------+------------------+-----+---------+---------------------+ PTA      154               1.23 triphasic                      +---------+------------------+-----+---------+---------------------+  DP       164               1.31 triphasic                      +---------+------------------+-----+---------+---------------------+ Great Toe                                recent toes amputated +---------+------------------+-----+---------+---------------------+ +--------+------------------+-----+---------+---------------+ Left    Lt Pressure (mmHg)IndexWaveform Comment         +--------+------------------+-----+---------+---------------+ Brachial                       triphasicIV and bandages +--------+------------------+-----+---------+---------------+ PTA     121               0.97 triphasic                +--------+------------------+-----+---------+---------------+ DP      140               1.12 triphasic                +--------+------------------+-----+---------+---------------+  Summary: Right: Resting right ankle-brachial index indicates noncompressible right lower extremity arteries. Left: Resting left ankle-brachial index is within normal range. No evidence of significant left lower extremity arterial disease.  *See table(s) above for measurements and observations.  Electronically signed by Jamelle Haring on 03/26/2020 at 5:30:30  PM.   Final    MYOCARDIAL PERFUSION IMAGING  Result Date: 03/18/2020  Nuclear stress EF: 55%.  There was no ST segment deviation noted during stress.  The study is normal.  The left ventricular ejection fraction is normal (55-65%).  This is a low risk study.  Gwyndolyn Kaufman, MD   ECHOCARDIOGRAM COMPLETE  Result Date: 03/31/2020    ECHOCARDIOGRAM REPORT   Patient Name:   [name] Date of Exam: 03/31/2020 Medical Rec #:  [id number]        Height:       64.0 in Accession #:    [id number]       Weight:       200.0 lb Date of Birth:  08-Jul-1970        BSA:          1.956 m Patient Age:    8 years         BP:           141/76 mmHg Patient Gender: M                HR:           77 bpm. Exam Location:  Inpatient Procedure: 2D Echo, Cardiac Doppler and Color Doppler Indications:    Bacteremia  History:        Patient has prior history of Echocardiogram examinations, most                 recent 01/22/2020. Signs/Symptoms:Bacteremia; Risk                 Factors:Diabetes, Current Smoker and Hypertension.  Sonographer:    Roseanna Rainbow RDCS Referring Phys: [id number] [facility]  Sonographer Comments: Technically difficult study due to poor echo windows. IMPRESSIONS  1. Left ventricular ejection fraction, by estimation, is 60 to 65%. Left ventricular ejection fraction by 2D MOD biplane is 64.1 %. Left ventricular ejection fraction by PLAX is 65 %. The left ventricle has normal function. The left  ventricle has no regional wall motion abnormalities. There is mild concentric left ventricular hypertrophy. Left ventricular diastolic parameters are consistent with Grade I diastolic dysfunction (impaired relaxation).  2. Right ventricular systolic function is normal. The right ventricular size is normal.  3. The pericardial effusion is circumferential and posterior to the left ventricle. There is no evidence of cardiac tamponade.  4. The mitral valve is normal in structure. No evidence of mitral valve regurgitation.  No evidence of mitral stenosis.  5. The aortic valve was not well visualized. Aortic valve regurgitation is not visualized. No aortic stenosis is present.  6. The inferior vena cava is normal in size with greater than 50% respiratory variability, suggesting right atrial pressure of 3 mmHg. FINDINGS  Left Ventricle: Left ventricular ejection fraction, by estimation, is 60 to 65%. Left ventricular ejection fraction by PLAX is 65 %. Left ventricular ejection fraction by 2D MOD biplane is 64.1 %. The left ventricle has normal function. The left ventricle has no regional wall motion abnormalities. The left ventricular internal cavity size was normal in size. There is mild concentric left ventricular hypertrophy. Left ventricular diastolic parameters are consistent with Grade I diastolic dysfunction (impaired relaxation). Indeterminate filling pressures. Right Ventricle: The right ventricular size is normal. No increase in right ventricular wall thickness. Right ventricular systolic function is normal. Left Atrium: Left atrial size was normal in size. Right Atrium: Right atrial size was normal in size. Pericardium: Trivial pericardial effusion is present. The pericardial effusion is circumferential and posterior to the left ventricle. There is no evidence of cardiac tamponade. Mitral Valve: The mitral valve is normal in structure. No evidence of mitral valve regurgitation. No evidence of mitral valve stenosis. Tricuspid Valve: The tricuspid valve is normal in structure. Tricuspid valve regurgitation is trivial. No evidence of tricuspid stenosis. Aortic Valve: The aortic valve was not well visualized. Aortic valve regurgitation is not visualized. No aortic stenosis is present. Pulmonic Valve: The pulmonic valve was normal in structure. Pulmonic valve regurgitation is not visualized. No evidence of pulmonic stenosis. Aorta: The aortic root is normal in size and structure. Venous: The inferior vena cava is normal in size  with greater than 50% respiratory variability, suggesting right atrial pressure of 3 mmHg. IAS/Shunts: No atrial level shunt detected by color flow Doppler.  LEFT VENTRICLE PLAX 2D                        Biplane EF (MOD) LV EF:         Left            LV Biplane EF:   Left                ventricular                      ventricular                ejection                         ejection                fraction by                      fraction by                PLAX is 65  2D MOD                %.                               biplane is LVIDd:         3.70 cm                          64.1 %. LVIDs:         2.40 cm LV PW:         1.33 cm         Diastology LV IVS:        1.17 cm         LV e' medial:    9.46 cm/s LVOT diam:     1.90 cm         LV E/e' medial:  10.1 LV SV:         69              LV e' lateral:   8.38 cm/s LV SV Index:   35              LV E/e' lateral: 11.4 LVOT Area:     2.84 cm  LV Volumes (MOD) LV vol d, MOD    93.7 ml A2C: LV vol d, MOD    75.8 ml A4C: LV vol s, MOD    30.8 ml A2C: LV vol s, MOD    27.8 ml A4C: LV SV MOD A2C:   62.9 ml LV SV MOD A4C:   75.8 ml LV SV MOD BP:    54.9 ml RIGHT VENTRICLE             IVC RV S prime:     12.00 cm/s  IVC diam: 1.50 cm TAPSE (M-mode): 2.1 cm LEFT ATRIUM           Index       RIGHT ATRIUM          Index LA diam:      3.70 cm 1.89 cm/m  RA Area:     9.46 cm LA Vol (A2C): 40.6 ml 20.75 ml/m RA Volume:   17.70 ml 9.05 ml/m LA Vol (A4C): 26.4 ml 13.49 ml/m  AORTIC VALVE LVOT Vmax:   111.00 cm/s LVOT Vmean:  75.400 cm/s LVOT VTI:    0.243 m  AORTA Ao Root diam: 3.20 cm MITRAL VALVE MV Area (PHT): 3.53 cm    SHUNTS MV Decel Time: 215 msec    Systemic VTI:  0.24 m MV E velocity: 95.50 cm/s  Systemic Diam: 1.90 cm MV A velocity: 81.00 cm/s MV E/A ratio:  1.18 Eleonore Chiquito MD Electronically signed by Eleonore Chiquito MD Signature Date/Time: 03/31/2020/3:17:08 PM    Final      Time Spent in minutes  30     Desiree Hane M.D on  04/03/2020 at 4:21 PM  To page go to [email or web address] - password [facility]

## 2020-04-03 NOTE — Progress Notes (Signed)
Nutrition Follow-up  DOCUMENTATION CODES:   Obesity unspecified  INTERVENTION:   -MVI with minerals daily -D/c Prosource Plus -D/c Juven -Ensure Max po daily, each supplement provides 150 kcal and 30 grams of protein.  -Magic cup BID with meals, each supplement provides 290 kcal and 9 grams of protein  NUTRITION DIAGNOSIS:   Increased nutrient needs related to acute illness,wound healing as evidenced by estimated needs.  Ongoing  GOAL:   Patient will meet greater than or equal to 90% of their needs  Progressing   MONITOR:   PO intake,Supplement acceptance,Labs,Weight trends  REASON FOR ASSESSMENT:   Consult Wound healing  ASSESSMENT:   50 y.o. male with medical history of type 2 DM, prior hx of amputation, HTN, and stage 3 CKD. He smokes 5-6 cigarettes/day. He presented to the ED with complaints of worsening wound x1 week. Foul-smelling drainage from wound.  12/30- s/p Procedures:             1) Transmetatarsal amputation right foot             2) Medial Plantar artery rotation flap             3) Application of wound VAC  Reviewed I/O's: +175 ml x 24 hours and +7.7 L since admission  UOP: 1 L x 24 hours  Pt unavailable at time of attempted contact.   Pt with very good appetite. Noted meal completion 80-100%. He is refusing Juven and Prosource Plus supplements.   Per chart review, SNF is being recommended, however, pt desires to go home. TOC continuing to work on most appropriate discharge disposition.   Medications reviewed and include ferrous sulfate and flagyl.   Labs reviewed: CBGS: 103-202 (inpatient orders for glycemic control are 0-6 units insulin aspart TID with meals and 5 units insulin glargine daily).   Diet Order:   Diet Order            Diet heart healthy/carb modified Room service appropriate? Yes; Fluid consistency: Thin  Diet effective now                 EDUCATION NEEDS:   No education needs have been identified at this  time  Skin:  Skin Assessment: Skin Integrity Issues: Skin Integrity Issues:: Incisions,Wound VAC Wound Vac: rt foot s/p TMA on 03/27/20 Incisions: rt foot  Last BM:  04/04/19  Height:   Ht Readings from Last 1 Encounters:  03/25/20 5\' 4"  (1.626 m)    Weight:   Wt Readings from Last 1 Encounters:  03/25/20 90.7 kg   BMI:  Body mass index is 34.33 kg/m.  Estimated Nutritional Needs:   Kcal:  1900-2100 kcal  Protein:  90-105 grams  Fluid:  >/= 1.9 L/day    Loistine Chance, RD, LDN, CDCES Registered Dietitian II Certified Diabetes Care and Education Specialist Please refer to [facility] for RD and/or RD on-call/weekend/after hours pager

## 2020-04-04 ENCOUNTER — Encounter: Payer: Medicaid Other | Admitting: Podiatry

## 2020-04-04 DIAGNOSIS — Z794 Long term (current) use of insulin: Secondary | ICD-10-CM | POA: Diagnosis not present

## 2020-04-04 DIAGNOSIS — M86171 Other acute osteomyelitis, right ankle and foot: Secondary | ICD-10-CM | POA: Diagnosis not present

## 2020-04-04 DIAGNOSIS — E1142 Type 2 diabetes mellitus with diabetic polyneuropathy: Secondary | ICD-10-CM | POA: Diagnosis not present

## 2020-04-04 LAB — BASIC METABOLIC PANEL
Anion gap: 6 (ref 5–15)
BUN: 20 mg/dL (ref 6–20)
CO2: 22 mmol/L (ref 22–32)
Calcium: 8.3 mg/dL — ABNORMAL LOW (ref 8.9–10.3)
Chloride: 110 mmol/L (ref 98–111)
Creatinine, Ser: 2.25 mg/dL — ABNORMAL HIGH (ref 0.61–1.24)
GFR, Estimated: 35 mL/min — ABNORMAL LOW (ref >60)
Glucose, Bld: 100 mg/dL — ABNORMAL HIGH (ref 70–99)
Potassium: 3.9 mmol/L (ref 3.5–5.1)
Sodium: 138 mmol/L (ref 135–145)

## 2020-04-04 LAB — GLUCOSE, CAPILLARY
Glucose-Capillary: 179 mg/dL — ABNORMAL HIGH (ref 70–99)
Glucose-Capillary: 148 mg/dL — ABNORMAL HIGH (ref 70–99)
Glucose-Capillary: 92 mg/dL (ref 70–99)
Glucose-Capillary: 102 mg/dL — ABNORMAL HIGH (ref 70–99)

## 2020-04-04 LAB — CBC
HCT: 25.3 % — ABNORMAL LOW (ref 39.0–52.0)
Hemoglobin: 7.9 g/dL — ABNORMAL LOW (ref 13.0–17.0)
MCH: 27.1 pg (ref 26.0–34.0)
MCHC: 31.2 g/dL (ref 30.0–36.0)
MCV: 86.6 fL (ref 80.0–100.0)
Platelets: 472 10*3/uL — ABNORMAL HIGH (ref 150–400)
RBC: 2.92 MIL/uL — ABNORMAL LOW (ref 4.22–5.81)
RDW: 16.1 % — ABNORMAL HIGH (ref 11.5–15.5)
WBC: 19.4 10*3/uL — ABNORMAL HIGH (ref 4.0–10.5)
nRBC: 0 % (ref 0.0–0.2)

## 2020-04-04 NOTE — Progress Notes (Signed)
Pt alert and aware sitting up in chair. Pt states how grateful he is to have a bible to read. He said he has questions. And so we explored those questions. He decided that he wanted to be saved. So we prayed the sinner's prayer and he gave his life to [place]. The chaplain offered caring and supportive presence, prayers and blessing. Further visits will be offered while a pt here.

## 2020-04-04 NOTE — TOC Progression Note (Addendum)
Transition of Care [facility]) - Progression Note    Patient Details  Name: [name] MRN: [id number] Date of Birth: [birth year]  Transition of [facility]) CM/SW Contact  Purcell Mouton, RN Phone Number: 04/04/2020, 4:52 PM  Clinical Narrative:     [name] was closed, VM was left. Will give pt information to call for an appointment. Pt is aware that he will need to call to make an appointment at wound care center.   Expected Discharge Plan: [place] Barriers to Discharge: No Barriers Identified  Expected Discharge Plan and Services Expected Discharge Plan: [place] arrangements for the past 2 months: Single Family Home                                       Social Determinants of Health (SDOH) Interventions    Readmission Risk Interventions No flowsheet data found.

## 2020-04-04 NOTE — Progress Notes (Signed)
Physical Therapy Treatment Patient Details Name: [name] MRN: [id number] DOB: May 02, 1970 Today's Date: 04/04/2020    History of Present Illness 50 year old male with past medical history of diabetes mellitus type 2, prior history of amputation L foot (great toe amp), hypertension, CKD stage III presented with worsening right foot. He had foul-smelling discharge coming from the R foot wound.  He was seen by podiatry and is now s/p TMA R  foot with wound vac placement    PT Comments    Pt progressing well. incr gait distance today however continues to need min to min/guard assist as well as cues for sequence and safety with amb.   Discussed utilizing w/c at home and doing transfers only initially for incr safety  And fall prevention. Home is w/c accessible as pt's girlfriend uses a w/c. [name] does not wish to go to rehab/SNF  Follow Up Recommendations  HHPT/OT     Equipment Recommendations  None recommended by PT    Recommendations for Other Services       Precautions / Restrictions Precautions Precautions: Fall Precaution Comments: NPWT R foot Required Braces or Orthoses: Other Brace Other Brace: cam boot on right Restrictions Weight Bearing Restrictions: No    Mobility  Bed Mobility Overal bed mobility: Needs Assistance Bed Mobility: Supine to Sit     Supine to sit: Modified independent (Device/Increase time)     General bed mobility comments: no physical assist  Transfers Overall transfer level: Needs assistance Equipment used: Rolling walker (2 wheeled) Transfers: Sit to/from Stand Sit to Stand: Min guard         General transfer comment: verbal cues for hand placement, min/guard for safety  to safely rise and transition to RW  Ambulation/Gait Ambulation/Gait assistance: Min guard;Min assist Gait Distance (Feet): 40 Feet (x2) Assistive device: Rolling walker (2 wheeled) Gait Pattern/deviations: Step-to pattern;Decreased stance time - right      General Gait Details: multi-modal cues for RW position, step length/step to gait. steadying assist however no overt LOB   Stairs             Wheelchair Mobility    Modified Rankin (Stroke Patients Only)       Balance   Sitting-balance support: Feet supported;No upper extremity supported Sitting balance-Leahy Scale: Good     Standing balance support: Bilateral upper extremity supported Standing balance-Leahy Scale: Poor Standing balance comment: reliant on UEs and external assist                            Cognition Arousal/Alertness: Awake/alert Behavior During Therapy: WFL for tasks assessed/performed Overall Cognitive Status: Within Functional Limits for tasks assessed                                        Exercises      General Comments        Pertinent Vitals/Pain Pain Assessment: No/denies pain    Home Living                      Prior Function            PT Goals (current goals can now be found in the care plan section) Acute Rehab PT Goals Patient Stated Goal: home PT Goal Formulation: With patient Time For Goal Achievement: 04/12/20 Potential to Achieve Goals: Good Progress towards PT goals:  Progressing toward goals    Frequency    Min 3X/week      PT Plan Current plan remains appropriate    Co-evaluation              AM-PAC PT "6 Clicks" Mobility   Outcome Measure  Help needed turning from your back to your side while in a flat bed without using bedrails?: A Little Help needed moving from lying on your back to sitting on the side of a flat bed without using bedrails?: A Little Help needed moving to and from a bed to a chair (including a wheelchair)?: A Little Help needed standing up from a chair using your arms (e.g., wheelchair or bedside chair)?: A Little Help needed to walk in hospital room?: A Little Help needed climbing 3-5 steps with a railing? : A Little 6 Click Score: 18     End of Session Equipment Utilized During Treatment: Gait belt Activity Tolerance: Patient tolerated treatment well Patient left: in chair;with call bell/phone within reach;with chair alarm set Nurse Communication: Mobility status PT Visit Diagnosis: Other abnormalities of gait and mobility (R26.89)     Time: 1025-8527 PT Time Calculation (min) (ACUTE ONLY): 21 min  Charges:  $Gait Training: 8-22 mins                     Baxter Flattery, PT  Acute Rehab Dept ([place]) [phone] Pager [phone]  04/04/2020    [facility] 04/04/2020, 2:14 PM

## 2020-04-04 NOTE — Plan of Care (Signed)
  Problem: Education: Goal: Knowledge of General Education information will improve Description: Including pain rating scale, medication(s)/side effects and non-pharmacologic comfort measures Outcome: Progressing   Problem: Clinical Measurements: Goal: Respiratory complications will improve Outcome: Progressing   Problem: Activity: Goal: Risk for activity intolerance will decrease Outcome: Progressing   Problem: Nutrition: Goal: Adequate nutrition will be maintained Outcome: Progressing   Problem: Coping: Goal: Level of anxiety will decrease Outcome: Progressing   Problem: Elimination: Goal: Will not experience complications related to bowel motility Outcome: Progressing   Problem: Pain Managment: Goal: General experience of comfort will improve Outcome: Progressing   Problem: Safety: Goal: Ability to remain free from injury will improve Outcome: Progressing   Problem: Skin Integrity: Goal: Risk for impaired skin integrity will decrease Outcome: Progressing

## 2020-04-04 NOTE — Discharge Summary (Signed)
[name] RJJ:[id number] DOB: [birth year] DOA: 03/25/2020  PCP: Gildardo Pounds, NP  Admit date: 03/25/2020 Discharge date: 04/08/2020  Admitted From: home Disposition:  home  Recommendations for Outpatient Follow-up:  1. Follow up with PCP in 1-2 weeks --repeat Outpatient TTE in 4-6 weeks --outpatient referral made to urology for foley due to urinary retention--they will call patient to confirm appointment --advised to hold lisinopril (previous home med) in setting of AKI until BMP check 2. Please obtain CBC (Wbc) and BMP (cr)in one week   Home Health:PT, RN  Equipment/Devices:foley, wound vac Discharge Condition:Stable  CODE STATUS:FULL Code   Brief/Interim Summary: History of present illness:  [name] is a 50 y.o. year old male with medical history significant for Type 2 diabetes, HTN, CKD stage III, chronic right foot wound presenting with worsening right foot pain for the past week and after palpation evaluation by his podiatrist was directed to the ED due to concern for right foot ulcer with osteomyelitis and gas gangrene based off wound evaluation) necrotic, red and, purulent drainage) and x-ray (outpatient) presents with osteomyelitis of fifth metatarsal with multifocal subcutaneous emphysema. He underwent emergent operative debridement on 12/30 with transmetatarsal amputation. During hospital course he is remained on vancomycin, ceftriaxone, and Flagyl for Peptostreptococcus bacteremia (1 of 2 blood cultures 03/25/2020) and Staphylococcus lugdunensis surgical wound culture.  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 Remains afebrile,  white count slowly downtrending from 30 to 19, repeat blood cultures showed clearance of bacteremia. Empirically on vancomycin, flagyl, and ceftriaxone before deescalating to oral antibiotics based off culture data -Doxycycline for staphylococcal bacteremia,  -Continue p.o. Flagyl for Peptostreptococcus, -Plan to continue doxycycline and flagyl for additional 8 days to complete  2 week course for surrounding cellulitis of right foot -PT recommends HHPT.  --Will need home care for [facility] on discharge, provided with assistance from case management --Weightbearing to heel with CAM boot on right --Continue with rest and elevation to assist with pain and edema control  --Close follow up with podiatrist arranged within week of discharge  Peptostreptococcus bacteremia. Repeat blood cultures on 1/3 show clearance. TTE with no vegetations -Continue doxycycline as above x8 days on discharge, to complete total course of 2 weeks  Leukocytosis, slowly downtrended from peak of 30 on admission. Remains stable at 19.x 3 days.   Remains afebrile, no new pain or worsening redness at foot or other areas, wound vac draining apporpriately.  No localizing signs or symptoms of infection.  In chart review seems to have elevated WBC at baseline of 12-14.  - Advise close CBC with PCP follow up  Type 2 diabetes with peripheral neuropathy and hyperlipidemia. A1c 7.9. Glucose remains well controlled here on carb modified diet and patient admits to lower appetite in hospital because of food preference -will reduce home regimen from 15 U BID to Lantus 15 U daily with close monitoring of glucose at home and titrate as needed with help of PCP--I expect him to need more as he admits he will eat more of the type of foods he likes -Continue gabapentin -continue statin  Pericardial effusion. Incidentally noted on TTE from 1/3. Trivial in size per report circumferential with no evidence of cardiac tamponade. Symptomatically has no  dyspnea, no chest  pain, no tachycardia -Closely monitor clinically --repeat Outpatient TTE in 4-6 weeks  Chronic normocytic anemia, stable.  Has history of iron deficiency anemia, takes iron at home. Slightly worse from baseline, likely related to postoperative changes. Previous baseline 9-10, currently 7.9-8.4. No episodes of bleeding -continue iron  CKD stage III, stable.  Creatinine stable at baseline -Avoid nephrotoxins, monitor output  Acute urinary retention.  Patient did not pass voiding trial continue to retain urine despite In-N-Out cath and required foley and maintained adequate output - Foley catheter replaced prior to discharge - Will need outpatient follow-up with urology  Hypertension at goal -Continue amlodipine, Coreg, clonidine -Home lisinopril held on admission due to AKI, advise to continue to hold until seen by PCP  Mood disorder, stable -continue Lexapro, Wellbutrin  HLD, stable -continue Lipitor, aspirin   Consultations:  Podiatry  Procedures/Studies: S/p TMA of Right foot on 12/30 Subjective:  Discharge Exam: Vitals:   04/07/20 2053 04/08/20 1431  BP: (!) 150/81 (!) 144/85  Pulse: 78 81  Resp: 14 16  Temp: 98.4 F (36.9 C)   SpO2: 99% 100%   Vitals:   04/07/20 0509 04/07/20 1243 04/07/20 2053 04/08/20 1431  BP: (!) 153/82 103/73 (!) 150/81 (!) 144/85  Pulse: 84 71 78 81  Resp: 17 16 14 16   Temp: 98.4 F (36.9 C) 98 F (36.7 C) 98.4 F (36.9 C)   TempSrc: Oral Oral Oral   SpO2: 99% 100% 99% 100%  Weight:      Height:       Awake Alert, Oriented X 3, Normal affect No new F.N deficits,  [place].AT, Normal respiratory effort on room air, CTAB RRR,No Gallops,Rubs or new Murmurs,  +ve B.Sounds, Abd Soft, No tenderness, No rebound, guarding or rigidity. Right foot status post transmetatarsal amputation with dressing in place that is clean, dry, intact.  And wound VAC in place  Discharge Diagnoses:  Active Problems:   Diabetes  mellitus with neurological manifestations, uncontrolled ([place])   Hypertension associated with diabetes ([place])   Depression   Peripheral polyneuropathy   CRI (chronic renal insufficiency), stage 3 (moderate) ([name])   Diabetic foot ulcer ([name])   Wound infection   Gas gangrene of foot [facility])    Discharge Instructions  Discharge Instructions    Diet - low sodium heart healthy   Complete by: As directed    Diet - low sodium heart healthy   Complete by: As directed    Discharge wound care:   Complete by: As directed    Wound vac in place. Follow up in wound care center   Discharge wound care:   Complete by: As directed    Wound vac in place. To go home with. To follow with podiatry   Increase activity slowly   Complete by: As directed    Increase activity slowly   Complete by: As directed      Allergies as of 04/08/2020      Reactions   Bee Venom Swelling   SWELLING REACTION UNSPECIFIED    Penicillins Hives, Other (See Comments)   Full body hives as a child with no shortness of breath or swelling  **Can tolerate cephalosporins**   Clonidine Derivatives    Pt states "It is making me dizzy"      Medication List    STOP taking these medications   ciprofloxacin 500 MG tablet Commonly known as: Cipro   lisinopril 10 MG tablet Commonly known as: ZESTRIL   sulfamethoxazole-trimethoprim 800-160 MG tablet Commonly known as:  BACTRIM DS     TAKE these medications   Accu-Chek FastClix Lancets Misc 1 Package by Does not apply route as directed. Use as instructed to test blood sugar 2 times daily E11.65   acetaminophen 500 MG tablet Commonly known as: TYLENOL Take 1,000 mg by mouth every 6 (six) hours as needed for mild pain.   amLODipine 10 MG tablet Commonly known as: NORVASC Take 1 tablet (10 mg total) by mouth daily.   aspirin EC 81 MG tablet Take 1 tablet (81 mg total) by mouth daily.   atorvastatin 20 MG tablet Commonly known as: LIPITOR Take 1 tablet (20 mg total)  by mouth daily.   b complex vitamins tablet Take 1 tablet by mouth daily.   blood glucose meter kit and supplies Kit Dispense based on patient and insurance preference. Use up to four times daily as directed. (FOR ICD-9 250.00, 250.01).   Blood Pressure Monitor Devi Please provide patient with insurance approved blood pressure monitor. I10.0   buPROPion 150 MG 12 hr tablet Commonly known as: Wellbutrin SR Take 1 tablet (150 mg total) by mouth daily.   carvedilol 25 MG tablet Commonly known as: Coreg Take 1 tablet (25 mg total) by mouth 2 (two) times daily.   cloNIDine 0.1 MG tablet Commonly known as: CATAPRES Take 1 tablet (0.1 mg total) by mouth 2 (two) times daily.   doxycycline 100 MG tablet Commonly known as: VIBRA-TABS Take 1 tablet (100 mg total) by mouth every 12 (twelve) hours for 8 days.   escitalopram 10 MG tablet Commonly known as: Lexapro Take 1 tablet (10 mg total) by mouth at bedtime.   ferrous sulfate 325 (65 FE) MG tablet Take 1 tablet (325 mg total) by mouth daily.   gabapentin 300 MG capsule Commonly known as: Neurontin Take 1 capsule (300 mg total) by mouth 3 (three) times daily. Patient needs office visit before refills will be given   glucose blood test strip Use as instructed to test blood sugar 2 times daily E11.65   insulin lispro 100 UNIT/ML KwikPen Commonly known as: HumaLOG KwikPen For blood sugars 0-150 give 0 units of insulin, 151-200 give 2 units of insulin, 201-250 give 4 units, 251-300 give 6 units, 301-350 give 8 units, 351-400 give 10 units,> 400 give 12 units and call M.D. Discussed hypoglycemia protocol.   Insulin Pen Needle 32G X 4 MM Misc Use as instructed. Inject into the skin three time daily.   Lantus SoloStar 100 UNIT/ML Solostar Pen Generic drug: insulin glargine Inject 15 Units into the skin daily. What changed: when to take this   metroNIDAZOLE 500 MG tablet Commonly known as: FLAGYL Take 1 tablet (500 mg total) by  mouth every 8 (eight) hours for 8 days.   Misc. Devices Misc Please provide patient with insurance approved diabetic shoes. E11.65   nicotine 7 mg/24hr patch Commonly known as: NICODERM CQ - dosed in mg/24 hr Place 1 patch (7 mg total) onto the skin daily.   Vitamin D3 50 MCG (2000 UT) capsule Take 1 capsule (2,000 Units total) by mouth daily.            Discharge Care Instructions  (From admission, onward)         Start     Ordered   04/08/20 0000  Discharge wound care:       Comments: Wound vac in place. Follow up in wound care center   04/08/20 1615   04/08/20 0000  Discharge wound care:  Comments: Wound vac in place. To go home with. To follow with podiatry   04/08/20 1615          Follow-up Information    [facility]. Schedule an appointment as soon as possible for a visit in 1 week(s).   Why: Please call and make an appointment next week Contact information: [place], [place] Follow up.   Why: agency will provide home health physical therapy and nurse. Contact information: [address] [place] [address] [phone]              Allergies  Allergen Reactions  . Bee Venom Swelling    SWELLING REACTION UNSPECIFIED   . Penicillins Hives and Other (See Comments)    Full body hives as a child with no shortness of breath or swelling  **Can tolerate cephalosporins**  . Clonidine Derivatives     Pt states "It is making me dizzy"        The results of significant diagnostics from this hospitalization (including imaging, microbiology, ancillary and laboratory) are listed below for reference.     Microbiology: Recent Results (from the past 240 hour(s))  Culture, blood (routine x 2)     Status: None   Collection Time: 03/31/20  4:54 AM   Specimen: BLOOD  Result Value Ref Range Status   Specimen Description   Final     BLOOD FOREARM Performed at [facility] [address] [place], [place] [address]    Special Requests   Final    BOTTLES DRAWN AEROBIC ONLY Blood Culture adequate volume Performed at [facility] [address], [place], [place] [address]    Culture   Final    NO GROWTH 5 DAYS Performed at [facility], [place] [address], [place], [place] [address]    Report Status 04/05/2020 FINAL  Final  Culture, blood (routine x 2)     Status: None   Collection Time: 03/31/20  4:54 AM   Specimen: BLOOD LEFT HAND  Result Value Ref Range Status   Specimen Description   Final    BLOOD LEFT HAND Performed at [facility] [address], [place], [place] [address]    Special Requests   Final    BOTTLES DRAWN AEROBIC ONLY Blood Culture adequate volume Performed at [facility] [address], [place], [place] [address]    Culture   Final    NO GROWTH 5 DAYS Performed at [facility], [place] [address] [place], [place] [address]    Report Status 04/05/2020 FINAL  Final     Labs: BNP (last 3 results) No results for input(s): BNP in the last 8760 hours. Basic Metabolic Panel: Recent Labs  Lab 04/02/20 0605 04/03/20 0537 04/04/20 0835  NA 137 138 138  K 3.8 3.9 3.9  CL 111 111 110  CO2 19* 17* 22  GLUCOSE 139* 90 100*  BUN 20 21* 20  CREATININE 2.28* 2.48* 2.25*  CALCIUM 7.9* 8.2* 8.3*  MG 1.7  --   --    Liver Function Tests: Recent Labs  Lab 04/02/20 0605  AST 14*  ALT 14  ALKPHOS 62  BILITOT 0.4  PROT 6.8  ALBUMIN 2.4*   No results for input(s): LIPASE, AMYLASE in the last 168 hours. No results for input(s): AMMONIA in the last 168 hours.  CBC: Recent Labs  Lab 04/02/20 0605 04/03/20 0537 04/04/20 0523  WBC 19.3* 19.2* 19.4*  NEUTROABS 14.5*  --   --   HGB 8.4* 8.5* 7.9*  HCT 26.6* 27.2* 25.3*  MCV 87.5 89.5 86.6  PLT 498* 484* 472*   Cardiac Enzymes: No results for input(s):  CKTOTAL, CKMB, CKMBINDEX, TROPONINI in the last 168 hours. BNP: Invalid input(s): POCBNP CBG: Recent Labs  Lab 04/07/20 2130 04/08/20 0242 04/08/20 0344 04/08/20 0751 04/08/20 1109  GLUCAP 98 72 109* 156* 137*   D-Dimer No results for input(s): DDIMER in the last 72 hours. Hgb A1c No results for input(s): HGBA1C in the last 72 hours. Lipid Profile No results for input(s): CHOL, HDL, LDLCALC, TRIG, CHOLHDL, LDLDIRECT in the last 72 hours. Thyroid function studies No results for input(s): TSH, T4TOTAL, T3FREE, THYROIDAB in the last 72 hours.  Invalid input(s): FREET3 Anemia work up No results for input(s): VITAMINB12, FOLATE, FERRITIN, TIBC, IRON, RETICCTPCT in the last 72 hours. Urinalysis    Component Value Date/Time   COLORURINE YELLOW 10/17/2018 1441   APPEARANCEUR CLEAR 10/17/2018 1441   LABSPEC >=1.030 (A) 10/17/2018 1441   PHURINE 5.0 10/17/2018 1441   GLUCOSEU >=1000 (A) 10/17/2018 1441   HGBUR MODERATE (A) 10/17/2018 1441   BILIRUBINUR NEGATIVE 10/17/2018 1441   KETONESUR NEGATIVE 10/17/2018 1441   PROTEINUR >=300 (A) 05/25/2017 1300   UROBILINOGEN 0.2 10/17/2018 1441   NITRITE NEGATIVE 10/17/2018 1441   LEUKOCYTESUR NEGATIVE 10/17/2018 1441   Sepsis Labs Invalid input(s): PROCALCITONIN,  WBC,  LACTICIDVEN Microbiology Recent Results (from the past 240 hour(s))  Culture, blood (routine x 2)     Status: None   Collection Time: 03/31/20  4:54 AM   Specimen: BLOOD  Result Value Ref Range Status   Specimen Description   Final    BLOOD FOREARM Performed at [facility], [place] [address], [place], [place] [address]    Special Requests   Final    BOTTLES DRAWN AEROBIC ONLY Blood Culture adequate volume Performed at [facility] [address], [place], [place] [address]    Culture   Final    NO GROWTH 5 DAYS Performed at [facility], [place] [address], [place], [place] [address]    Report Status 04/05/2020 FINAL   Final  Culture, blood (routine x 2)     Status: None   Collection Time: 03/31/20  4:54 AM   Specimen: BLOOD LEFT HAND  Result Value Ref Range Status   Specimen Description   Final    BLOOD LEFT HAND Performed at [facility] [address] [place], [place] [address]    Special Requests   Final    BOTTLES DRAWN AEROBIC ONLY Blood Culture adequate volume Performed at [facility] [address], [place], [place] [address]    Culture   Final    NO GROWTH 5 DAYS Performed at [facility], [place] [address] [place], [place] [address]    Report Status 04/05/2020 FINAL  Final     Time coordinating discharge: Over 30 minutes  SIGNED:   Desiree Hane, MD  [facility] 04/08/2020, 4:17 PM Pager   If 7PM-7AM, please contact night-coverage [email or web address] Password [email or web address]

## 2020-04-04 NOTE — Progress Notes (Signed)
[facility]  PROGRESS NOTE  Aarya Quebedeaux Heiland TFT:[id number] DOB: [birth year] DOA: 03/25/2020 PCP: Gildardo Pounds, NP Admit date - 03/25/2020   Admitting Physician Lavina Hamman, MD  Outpatient Primary MD for the patient is Gildardo Pounds, NP  LOS - 10 Brief Narrative   MCKAY TEGTMEYER is a 50 y.o. year old male with medical history significant for Type 2 diabetes, HTN, CKD stage III, chronic right foot wound presenting with worsening right foot pain for the past week and after palpation evaluation by his podiatrist was directed to the ED due to concern for right foot ulcer with osteomyelitis and gas gangrene based off wound evaluation) necrotic, red and, purulent drainage) and x-ray (outpatient) presents with osteomyelitis of fifth metatarsal with multifocal subcutaneous emphysema. He underwent emergent operative debridement on 12/30 with transmetatarsal amputation. During hospital course he is remained on vancomycin, ceftriaxone, and Flagyl for Peptostreptococcus bacteremia (1 of 2 blood cultures 03/25/2020) and Staphylococcus lugdunensis surgical wound culture.   Subjective  No acute events overnight.  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 Remains afebrile, white count slowly downtrending, repeat blood cultures unremarkable. Suspect polymicrobial, surgical culture positive for Staphylococcus lugdunensis -Doxycycline for staphylococcal bacteremia, discontinue vancomycin on 1/5 -Continue p.o. Flagyl for Peptostreptococcus, discontinued ceftriaxone on 1/5 -Plan to continue oral antibiotics for 2 weeks for soft tissue infection right foot -PT recommends SNF continue monitor CBC for downtrend depending resuming wound VAC,  and will need home care for [facility].  Patient does not want to go to SNF  Peptostreptococcus bacteremia. Repeat blood cultures on 1/30 remain negative. TTE with no vegetations -Stable antibiotics  Leukocytosis, slowly downtrending.  Stable at 19.  Remains afebrile.  No localizing signs or symptoms of infection. - Continue closely monitor CBC  Type 2 diabetes with peripheral neuropathy and hyperlipidemia. A1c 7.9. Glucose remains well controlled here -Continue Lantus 5 units, sliding scale insulin as needed -Continue gabapentin -continue statin  Pericardial effusion. Incidentally noted on TTE from 1/3. Trivial in size per report circumferential with no evidence of cardiac tamponade. Symptomatically has no dyspnea, no chest pain, no tachycardia -Closely monitor clinically  Chronic normocytic anemia, stable. Has history of iron deficiency anemia, takes iron at home. Slightly worse from baseline, likely related to postoperative changes. Previous baseline 9-10, currently 7.9-8.4. No episodes of bleeding-monitor CBC  CKD stage III, stable. Creatinine stable at baseline -Avoid nephrotoxins, monitor output  Hypertension at goal -Continue amlodipine, Coreg, clonidine -Home lisinopril held on admission  Mood disorder, stable-continue Lexapro, Wellbutrin  HLD, stable-continue Lipitor, aspirin    Family Communication  : None  Code Status : Full  Disposition Plan  :  Patient is from home. Anticipated d/c date: Medically stable for discharge. Barriers to d/c or necessity for inpatient status:  Medically stable for discharge, needs safe disposition will need case management assistance to ensure wound VAC when discharged home  Consults  : Podiatry  Procedures  : Transmetatarsal amputation 12/30  DVT Prophylaxis  :  Heparin  MDM: The below labs and imaging reports were reviewed and summarized above.  Medication management as above.  Lab Results  Component Value Date   PLT 472 (H)  04/04/2020    Diet :  Diet Order  Diet heart healthy/carb modified Room service appropriate? Yes; Fluid consistency: Thin  Diet effective now                  Inpatient Medications Scheduled Meds: . amLODipine  10 mg Oral Daily  . aspirin EC  81 mg Oral Daily  . atorvastatin  20 mg Oral Daily  . B-complex with vitamin C  1 tablet Oral Daily  . buPROPion  150 mg Oral Daily  . carvedilol  25 mg Oral BID  . Chlorhexidine Gluconate Cloth  6 each Topical UD  . cloNIDine  0.1 mg Oral BID  . doxycycline  100 mg Oral Q12H  . escitalopram  10 mg Oral QHS  . ferrous sulfate  325 mg Oral Daily  . gabapentin  300 mg Oral TID  . heparin  5,000 Units Subcutaneous Q8H  . insulin aspart  0-6 Units Subcutaneous TID WC  . insulin glargine  5 Units Subcutaneous Daily  . metroNIDAZOLE  500 mg Oral Q8H  . multivitamin with minerals  1 tablet Oral Q24H  . nicotine  7 mg Transdermal Daily  . Ensure Max Protein  11 oz Oral QHS   Continuous Infusions: . methocarbamol (ROBAXIN) IV     PRN Meds:.acetaminophen **OR** acetaminophen, hydrALAZINE, HYDROmorphone (DILAUDID) injection, melatonin, methocarbamol (ROBAXIN) IV, ondansetron **OR** ondansetron (ZOFRAN) IV, oxyCODONE, prochlorperazine  Antibiotics  :   Anti-infectives (From admission, onward)   Start     Dose/Rate Route Frequency Ordered Stop   04/02/20 2200  doxycycline (VIBRA-TABS) tablet 100 mg        100 mg Oral Every 12 hours 04/02/20 1405     03/31/20 1000  vancomycin (VANCOCIN) IVPB 1000 mg/200 mL premix  Status:  Discontinued        1,000 mg 200 mL/hr over 60 Minutes Intravenous Every 36 hours 03/31/20 0914 04/02/20 1405   03/29/20 1800  vancomycin (VANCOCIN) IVPB 1000 mg/200 mL premix  Status:  Discontinued        1,000 mg 200 mL/hr over 60 Minutes Intravenous Every 48 hours 03/28/20 1012 03/31/20 0914   03/27/20 1500  vancomycin (VANCOREADY) IVPB 1250 mg/250 mL  Status:  Discontinued        1,250 mg 166.7 mL/hr over  90 Minutes Intravenous Every 48 hours 03/25/20 2020 03/28/20 1012   03/27/20 0815  vancomycin (VANCOCIN) powder  Status:  Discontinued          As needed 03/27/20 0815 03/27/20 1103   03/27/20 0600  clindamycin (CLEOCIN) IVPB 900 mg        900 mg 100 mL/hr over 30 Minutes Intravenous On call to O.R. 03/26/20 1633 03/27/20 0745   03/25/20 2200  metroNIDAZOLE (FLAGYL) tablet 500 mg        500 mg Oral Every 8 hours 03/25/20 1841     03/25/20 1845  cefTRIAXone (ROCEPHIN) 2 g in sodium chloride 0.9 % 100 mL IVPB  Status:  Discontinued        2 g 200 mL/hr over 30 Minutes Intravenous Every 24 hours 03/25/20 1841 04/02/20 1508   03/25/20 1500  vancomycin (VANCOREADY) IVPB 1500 mg/300 mL        1,500 mg 150 mL/hr over 120 Minutes Intravenous  Once 03/25/20 1426 03/25/20 1744   03/25/20 1430  ceFEPIme (MAXIPIME) 2 g in sodium chloride 0.9 % 100 mL IVPB        2 g 200 mL/hr over 30 Minutes Intravenous  Once 03/25/20 1425 03/25/20 1510   03/25/20 1430  metroNIDAZOLE (FLAGYL) tablet 500 mg        500 mg Oral  Once 03/25/20 1425 03/25/20 1442       Objective   Vitals:   04/03/20 1441 04/03/20 2143 04/04/20 0518 04/04/20 1310  BP: 140/82 133/79 (!) 143/83 126/76  Pulse: 78 75 79 70  Resp: 20 20 20 18   Temp: 98 F (36.7 C) 98.1 F (36.7 C) 98.2 F (36.8 C) 97.8 F (36.6 C)  TempSrc: Oral Oral Oral Oral  SpO2: 99% 95% 100% 100%  Weight:      Height:        SpO2: 100 % O2 Flow Rate (L/min): 2 L/min  Wt Readings from Last 3 Encounters:  03/25/20 90.7 kg  03/18/20 91.2 kg  03/06/20 91.4 kg     Intake/Output Summary (Last 24 hours) at 04/04/2020 1913 Last data filed at 04/04/2020 1836 Gross per 24 hour  Intake 1530 ml  Output 850 ml  Net 680 ml    Physical Exam:     Awake Alert, Oriented X 3, Normal affect No new F.N deficits,  [place].AT, Normal respiratory effort on room air, CTAB RRR,No Gallops,Rubs or new Murmurs,  +ve B.Sounds, Abd Soft, No tenderness, No rebound,  guarding or rigidity. Right foot status post transmetatarsal imitation with dressing in place that is clean, dry, intact.  And wound VAC in place   I have personally reviewed the following:   Data Reviewed:  CBC Recent Labs  Lab 03/30/20 0749 03/31/20 0454 04/01/20 0412 04/02/20 0605 04/03/20 0537 04/04/20 0523  WBC 26.0* 24.3* 21.1* 19.3* 19.2* 19.4*  HGB 8.1* 7.9* 8.2* 8.4* 8.5* 7.9*  HCT 26.4* 25.4* 26.9* 26.6* 27.2* 25.3*  PLT 443* 451* 453* 498* 484* 472*  MCV 88.9 87.6 90.3 87.5 89.5 86.6  MCH 27.3 27.2 27.5 27.6 28.0 27.1  MCHC 30.7 31.1 30.5 31.6 31.3 31.2  RDW 15.1 15.2 15.3 15.8* 16.0* 16.1*  LYMPHSABS 2.1 2.1 2.8 2.9  --   --   MONOABS 1.4* 1.1* 0.9 1.1*  --   --   EOSABS 0.5 0.5 0.5 0.5  --   --   BASOSABS 0.1 0.1 0.0 0.1  --   --     Chemistries  Recent Labs  Lab 03/30/20 0749 03/31/20 0454 04/01/20 0412 04/02/20 0605 04/03/20 0537 04/04/20 0835  NA 133* 140 138 137 138 138  K 3.7 3.8 3.5 3.8 3.9 3.9  CL 109 115* 112* 111 111 110  CO2 17* 18* 18* 19* 17* 22  GLUCOSE 135* 65* 140* 139* 90 100*  BUN 29* 29* 23* 20 21* 20  CREATININE 2.45* 2.34* 2.15* 2.28* 2.48* 2.25*  CALCIUM 8.0* 8.2* 8.0* 7.9* 8.2* 8.3*  MG 1.7 1.8 1.9 1.7  --   --   AST 15  --   --  14*  --   --   ALT 15  --   --  14  --   --   ALKPHOS 60  --   --  62  --   --   BILITOT 0.2*  --   --  0.4  --   --    ------------------------------------------------------------------------------------------------------------------ No results for input(s): CHOL, HDL, LDLCALC, TRIG, CHOLHDL, LDLDIRECT in the last 72 hours.  Lab Results  Component Value Date   HGBA1C 7.9 (H) 03/25/2020   ------------------------------------------------------------------------------------------------------------------ No results for input(s): TSH, T4TOTAL, T3FREE, THYROIDAB in the last 72 hours.  Invalid input(s):  FREET3 ------------------------------------------------------------------------------------------------------------------ No results for input(s): VITAMINB12, FOLATE, FERRITIN, TIBC, IRON,  RETICCTPCT in the last 72 hours.  Coagulation profile No results for input(s): INR, PROTIME in the last 168 hours.  No results for input(s): DDIMER in the last 72 hours.  Cardiac Enzymes No results for input(s): CKMB, TROPONINI, MYOGLOBIN in the last 168 hours.  Invalid input(s): CK ------------------------------------------------------------------------------------------------------------------ No results found for: BNP  Micro Results Recent Results (from the past 240 hour(s))  Aerobic/Anaerobic Culture (surgical/deep wound)     Status: None   Collection Time: 03/25/20  8:12 PM   Specimen: Wound; Tissue  Result Value Ref Range Status   Specimen Description   Final    WOUND Performed at [facility] [address], [place], [place] [address]    Special Requests   Final    NONE Performed at [facility], [place] [address], [place], [place] [address]    Gram Stain   Final    NO WBC SEEN NO ORGANISMS SEEN Performed at [facility], [place] [address], [place], [place] [address]    Culture   Final    RARE STAPHYLOCOCCUS LUGDUNENSIS MIXED ANAEROBIC FLORA PRESENT.  CALL LAB IF FURTHER IID REQUIRED.    Report Status 04/01/2020 FINAL  Final   Organism ID, Bacteria STAPHYLOCOCCUS LUGDUNENSIS  Final      Susceptibility   Staphylococcus lugdunensis - MIC*    CIPROFLOXACIN <=0.5 SENSITIVE Sensitive     ERYTHROMYCIN >=8 RESISTANT Resistant     GENTAMICIN <=0.5 SENSITIVE Sensitive     OXACILLIN >=4 RESISTANT Resistant     TETRACYCLINE <=1 SENSITIVE Sensitive     VANCOMYCIN <=0.5 SENSITIVE Sensitive     TRIMETH/SULFA <=10 SENSITIVE Sensitive     CLINDAMYCIN >=8 RESISTANT Resistant     RIFAMPIN <=0.5 SENSITIVE Sensitive     Inducible Clindamycin NEGATIVE  Sensitive     * RARE STAPHYLOCOCCUS LUGDUNENSIS  Aerobic/Anaerobic Culture (surgical/deep wound)     Status: None   Collection Time: 03/27/20  8:30 AM   Specimen: Foot, Right; Wound  Result Value Ref Range Status   Specimen Description   Final    FOOT RIGHT 5TH METATARSAL Performed at [facility] [address] [place], [place] [address]    Special Requests   Final    NONE Performed at [facility], [place] [address], [place], [place] [address]    Gram Stain NO WBC SEEN NO ORGANISMS SEEN   Final   Culture   Final    No growth aerobically or anaerobically. Performed at [facility], [place] [address] [place], [place] [address]    Report Status 04/01/2020 FINAL  Final  Culture, blood (routine x 2)     Status: None (Preliminary result)   Collection Time: 03/31/20  4:54 AM   Specimen: BLOOD  Result Value Ref Range Status   Specimen Description   Final    BLOOD FOREARM Performed at [facility] [address], [place], [place] [address]    Special Requests   Final    BOTTLES DRAWN AEROBIC ONLY Blood Culture adequate volume Performed at [facility] [address], [place], [place] [address]    Culture   Final    NO GROWTH 3 DAYS Performed at [facility], [place] [address] [place], [place] [address]    Report Status PENDING  Incomplete  Culture, blood (routine x 2)     Status: None (Preliminary result)   Collection Time: 03/31/20  4:54 AM   Specimen: BLOOD  LEFT HAND  Result Value Ref Range Status   Specimen Description   Final    BLOOD LEFT HAND Performed at [facility] [address] [place], [place] [address]    Special Requests   Final    BOTTLES DRAWN AEROBIC ONLY Blood Culture adequate volume Performed at [facility] [address] [place], [place] [address]    Culture   Final    NO GROWTH 3 DAYS Performed at [facility],  [place] [address] [place], [place] [address]    Report Status PENDING  Incomplete    Radiology Reports DG Abd 2 Views  Result Date: 03/28/2020 CLINICAL DATA:  RIGHT foot wound. EXAM: ABDOMEN - 2 VIEW COMPARISON:  None. FINDINGS: Scattered nonspecific air-fluid levels within the small and large bowel. No dilated large or small bowel loops. No evidence of free intraperitoneal air is seen. No acute appearing osseous abnormality. IMPRESSION: 1. Scattered nonspecific air-fluid levels throughout the abdomen, involving both large and small bowel, suggesting gastroenteritis or enterocolitis of infectious or inflammatory nature. 2. No evidence of bowel obstruction. Electronically Signed   By: Franki Cabot M.D.   On: 03/28/2020 15:40   DG Foot 2 Views Right  Result Date: 03/27/2020 CLINICAL DATA:  Postop transmetatarsal amputation EXAM: RIGHT FOOT - 2 VIEW COMPARISON:  None. FINDINGS: Interval transmetatarsal amputation. No bone destruction. No periosteal reaction. No fracture or dislocation. Postsurgical changes in the overlying soft tissues. Small plantar calcaneal spur. Lateral wound VAC noted. IMPRESSION: Interval transmetatarsal amputation. Electronically Signed   By: Kathreen Devoid   On: 03/27/2020 10:57   DG Foot 2 Views Right  Result Date: 03/25/2020 CLINICAL DATA:  Transmetatarsal amputation EXAM: RIGHT FOOT - 2 VIEW COMPARISON:  03/19/2020 FINDINGS: Previous amputation at the level of the MTP joint involving the first and third digits. Chronic dorsal displacement of the base of the second proximal phalanx with respect to the head of the metatarsal. Interval amputation of fifth digit at the level of the proximal shaft of the metatarsal. Vascular calcifications. IMPRESSION: 1. Interval amputation of fifth digit at the level of the proximal shaft of the metatarsal. 2. Previous amputation of the first and third digits. Chronic dislocation at the second MTP joint. Electronically Signed   By: Donavan Foil M.D.    On: 03/25/2020 21:26   DG Foot Complete Right  Result Date: 03/25/2020 Please see detailed radiograph report in office note.  DG Foot Complete Right  Result Date: 03/19/2020 Please see detailed radiograph report in office note.  VAS [place] ABI WITH/WO TBI  Result Date: 03/26/2020 LOWER EXTREMITY DOPPLER STUDY Indications: Gangrene, and peripheral artery disease.  Vascular Interventions: Pre-Op TMA 12/30 s/p toe amputations. Performing Technologist: Vonzell Schlatter RVT  Examination Guidelines: A complete evaluation includes at minimum, Doppler waveform signals and systolic blood pressure reading at the level of bilateral brachial, anterior tibial, and posterior tibial arteries, when vessel segments are accessible. Bilateral testing is considered an integral part of a complete examination. Photoelectric Plethysmograph (PPG) waveforms and toe systolic pressure readings are included as required and additional duplex testing as needed. Limited examinations for reoccurring indications may be performed as noted.  ABI Findings: +---------+------------------+-----+---------+---------------------+ Right    Rt Pressure (mmHg)IndexWaveform Comment               +---------+------------------+-----+---------+---------------------+ Brachial 125                    triphasic                      +---------+------------------+-----+---------+---------------------+  PTA      154               1.23 triphasic                      +---------+------------------+-----+---------+---------------------+ DP       164               1.31 triphasic                      +---------+------------------+-----+---------+---------------------+ Great Toe                                recent toes amputated +---------+------------------+-----+---------+---------------------+ +--------+------------------+-----+---------+---------------+ Left    Lt Pressure (mmHg)IndexWaveform Comment          +--------+------------------+-----+---------+---------------+ Brachial                       triphasicIV and bandages +--------+------------------+-----+---------+---------------+ PTA     121               0.97 triphasic                +--------+------------------+-----+---------+---------------+ DP      140               1.12 triphasic                +--------+------------------+-----+---------+---------------+  Summary: Right: Resting right ankle-brachial index indicates noncompressible right lower extremity arteries. Left: Resting left ankle-brachial index is within normal range. No evidence of significant left lower extremity arterial disease.  *See table(s) above for measurements and observations.  Electronically signed by Jamelle Haring on 03/26/2020 at 5:30:30 PM.   Final    MYOCARDIAL PERFUSION IMAGING  Result Date: 03/18/2020  Nuclear stress EF: 55%.  There was no ST segment deviation noted during stress.  The study is normal.  The left ventricular ejection fraction is normal (55-65%).  This is a low risk study.  Gwyndolyn Kaufman, MD   ECHOCARDIOGRAM COMPLETE  Result Date: 03/31/2020    ECHOCARDIOGRAM REPORT   Patient Name:   JERIMIE MANCUSO Date of Exam: 03/31/2020 Medical Rec #:  [id number]        Height:       64.0 in Accession #:    [id number]       Weight:       200.0 lb Date of Birth:  [birth year]        BSA:          1.956 m Patient Age:    60 years         BP:           141/76 mmHg Patient Gender: M                HR:           77 bpm. Exam Location:  Inpatient Procedure: 2D Echo, Cardiac Doppler and Color Doppler Indications:    Bacteremia  History:        Patient has prior history of Echocardiogram examinations, most                 recent 01/22/2020. Signs/Symptoms:Bacteremia; Risk                 Factors:Diabetes, Current Smoker and Hypertension.  Sonographer:    Roseanna Rainbow RDCS Referring Phys: [id number] [facility]  Sonographer Comments: Technically difficult study  due to poor echo windows. IMPRESSIONS  1. Left ventricular ejection fraction, by estimation, is 60 to 65%. Left ventricular ejection fraction by 2D MOD biplane is 64.1 %. Left ventricular ejection fraction by PLAX is 65 %. The left ventricle has normal function. The left ventricle has no regional wall motion abnormalities. There is mild concentric left ventricular hypertrophy. Left ventricular diastolic parameters are consistent with Grade I diastolic dysfunction (impaired relaxation).  2. Right ventricular systolic function is normal. The right ventricular size is normal.  3. The pericardial effusion is circumferential and posterior to the left ventricle. There is no evidence of cardiac tamponade.  4. The mitral valve is normal in structure. No evidence of mitral valve regurgitation. No evidence of mitral stenosis.  5. The aortic valve was not well visualized. Aortic valve regurgitation is not visualized. No aortic stenosis is present.  6. The inferior vena cava is normal in size with greater than 50% respiratory variability, suggesting right atrial pressure of 3 mmHg. FINDINGS  Left Ventricle: Left ventricular ejection fraction, by estimation, is 60 to 65%. Left ventricular ejection fraction by PLAX is 65 %. Left ventricular ejection fraction by 2D MOD biplane is 64.1 %. The left ventricle has normal function. The left ventricle has no regional wall motion abnormalities. The left ventricular internal cavity size was normal in size. There is mild concentric left ventricular hypertrophy. Left ventricular diastolic parameters are consistent with Grade I diastolic dysfunction (impaired relaxation). Indeterminate filling pressures. Right Ventricle: The right ventricular size is normal. No increase in right ventricular wall thickness. Right ventricular systolic function is normal. Left Atrium: Left atrial size was normal in size. Right Atrium: Right atrial size was normal in size. Pericardium: Trivial pericardial  effusion is present. The pericardial effusion is circumferential and posterior to the left ventricle. There is no evidence of cardiac tamponade. Mitral Valve: The mitral valve is normal in structure. No evidence of mitral valve regurgitation. No evidence of mitral valve stenosis. Tricuspid Valve: The tricuspid valve is normal in structure. Tricuspid valve regurgitation is trivial. No evidence of tricuspid stenosis. Aortic Valve: The aortic valve was not well visualized. Aortic valve regurgitation is not visualized. No aortic stenosis is present. Pulmonic Valve: The pulmonic valve was normal in structure. Pulmonic valve regurgitation is not visualized. No evidence of pulmonic stenosis. Aorta: The aortic root is normal in size and structure. Venous: The inferior vena cava is normal in size with greater than 50% respiratory variability, suggesting right atrial pressure of 3 mmHg. IAS/Shunts: No atrial level shunt detected by color flow Doppler.  LEFT VENTRICLE PLAX 2D                        Biplane EF (MOD) LV EF:         Left            LV Biplane EF:   Left                ventricular                      ventricular                ejection                         ejection                fraction by  fraction by                PLAX is 65                       2D MOD                %.                               biplane is LVIDd:         3.70 cm                          64.1 %. LVIDs:         2.40 cm LV PW:         1.33 cm         Diastology LV IVS:        1.17 cm         LV e' medial:    9.46 cm/s LVOT diam:     1.90 cm         LV E/e' medial:  10.1 LV SV:         69              LV e' lateral:   8.38 cm/s LV SV Index:   35              LV E/e' lateral: 11.4 LVOT Area:     2.84 cm  LV Volumes (MOD) LV vol d, MOD    93.7 ml A2C: LV vol d, MOD    75.8 ml A4C: LV vol s, MOD    30.8 ml A2C: LV vol s, MOD    27.8 ml A4C: LV SV MOD A2C:   62.9 ml LV SV MOD A4C:   75.8 ml LV SV MOD BP:    54.9 ml RIGHT  VENTRICLE             IVC RV S prime:     12.00 cm/s  IVC diam: 1.50 cm TAPSE (M-mode): 2.1 cm LEFT ATRIUM           Index       RIGHT ATRIUM          Index LA diam:      3.70 cm 1.89 cm/m  RA Area:     9.46 cm LA Vol (A2C): 40.6 ml 20.75 ml/m RA Volume:   17.70 ml 9.05 ml/m LA Vol (A4C): 26.4 ml 13.49 ml/m  AORTIC VALVE LVOT Vmax:   111.00 cm/s LVOT Vmean:  75.400 cm/s LVOT VTI:    0.243 m  AORTA Ao Root diam: 3.20 cm MITRAL VALVE MV Area (PHT): 3.53 cm    SHUNTS MV Decel Time: 215 msec    Systemic VTI:  0.24 m MV E velocity: 95.50 cm/s  Systemic Diam: 1.90 cm MV A velocity: 81.00 cm/s MV E/A ratio:  1.18 Eleonore Chiquito MD Electronically signed by Eleonore Chiquito MD Signature Date/Time: 03/31/2020/3:17:08 PM    Final      Time Spent in minutes  30     Desiree Hane M.D on 04/04/2020 at 7:13 PM  To page go to [email or web address] - password [email or web address]

## 2020-04-04 NOTE — Progress Notes (Signed)
Patient had voided 375 mL. Rescanned bladder and there was 253 mL in bladder. Will continue to monitor.

## 2020-04-04 NOTE — Progress Notes (Addendum)
Performed bladder scanner which read 336 mL in bladder. Nurse encouraged patient to use the bedside commode to see if that would help patient to urinate. Patient refused and patient stated that he did not have to urinate. Patient is also aware that a catheter might have to be put back in place for urinary retention, and patient is already saying that he does not want that. Will continue to monitor.

## 2020-04-05 DIAGNOSIS — E0843 Diabetes mellitus due to underlying condition with diabetic autonomic (poly)neuropathy: Secondary | ICD-10-CM | POA: Diagnosis not present

## 2020-04-05 LAB — CULTURE, BLOOD (ROUTINE X 2)
Culture: NO GROWTH
Culture: NO GROWTH
Special Requests: ADEQUATE
Special Requests: ADEQUATE

## 2020-04-05 MED ORDER — INSULIN GLARGINE 100 UNIT/ML ~~LOC~~ SOLN
2.0000 [IU] | Freq: Every day | SUBCUTANEOUS | Status: DC
  Administered 2020-04-06: 2 [IU] via SUBCUTANEOUS
  Filled 2020-04-05 (×3): qty 0.02

## 2020-04-05 NOTE — Progress Notes (Signed)
Physical Therapy Treatment Patient Details Name: [name] MRN: [id number] DOB: [birth year] Today's Date: 04/05/2020    History of Present Illness 50 year old male with past medical history of diabetes mellitus type 2, prior history of amputation L foot (great toe amp), hypertension, CKD stage III presented with worsening right foot. He had foul-smelling discharge coming from the R foot wound.  He was seen by podiatry and is now s/p TMA R  foot with wound vac placement    PT Comments    Pt progressing well.worked on stand pivot transfers  Today to simulate transfers from bed to w/c at home. Reviewed locking brakes, safety, etc. Pt familiar with  Techniques.  He is hopeful to go home soon , however [name] states their may be a delay d/t VAC for home not available. It is noted that there is very little exudate in VAC canister--he is hopeful to transition to home even if he has to do dressing changes until [name] arrives at home.    Follow Up Recommendations  Home health PT;Other (comment) (HHOT)     Equipment Recommendations  None recommended by PT --has w/c   Recommendations for Other Services       Precautions / Restrictions Precautions Precautions: Fall Precaution Comments: NPWT R foot Required Braces or Orthoses: Other Brace Other Brace: cam boot on right Restrictions Weight Bearing Restrictions: No Other Position/Activity Restrictions: WBAT in camboot    Mobility  Bed Mobility Overal bed mobility: Needs Assistance Bed Mobility: Supine to Sit     Supine to sit: Modified independent (Device/Increase time)     General bed mobility comments: no physical assist  Transfers Overall transfer level: Needs assistance Equipment used: Rolling walker (2 wheeled) Transfers: Sit to/from [facility] Sit to Stand: Supervision Stand pivot transfers: Supervision       General transfer comment: for safety only. cues to stay in frame of walker for stand pivot , no  physical assist (to simulate pivot to w/c at home)  Ambulation/Gait                 Stairs             Wheelchair Mobility    Modified Rankin (Stroke Patients Only)       Balance     Sitting balance-Leahy Scale: Good       Standing balance-Leahy Scale: Poor Standing balance comment: reliant on UEs                            Cognition Arousal/Alertness: Awake/alert Behavior During Therapy: WFL for tasks assessed/performed Overall Cognitive Status: Within Functional Limits for tasks assessed                                        Exercises      General Comments        Pertinent Vitals/Pain Pain Assessment: No/denies pain    Home Living                      Prior Function            PT Goals (current goals can now be found in the care plan section) Acute Rehab PT Goals Patient Stated Goal: home PT Goal Formulation: With patient Time For Goal Achievement: 04/12/20 Potential to Achieve Goals: Good Progress towards PT goals: Progressing toward  goals    Frequency    Min 3X/week      PT Plan Current plan remains appropriate    Co-evaluation              AM-PAC PT "6 Clicks" Mobility   Outcome Measure  Help needed turning from your back to your side while in a flat bed without using bedrails?: None Help needed moving from lying on your back to sitting on the side of a flat bed without using bedrails?: None Help needed moving to and from a bed to a chair (including a wheelchair)?: None Help needed standing up from a chair using your arms (e.g., wheelchair or bedside chair)?: None Help needed to walk in hospital room?: A Little Help needed climbing 3-5 steps with a railing? : A Little 6 Click Score: 22    End of Session Equipment Utilized During Treatment: Gait belt Activity Tolerance: Patient tolerated treatment well Patient left: in chair;with call bell/phone within reach;with chair alarm  set   PT Visit Diagnosis: Other abnormalities of gait and mobility (R26.89)     Time: 9518-8416 PT Time Calculation (min) (ACUTE ONLY): 18 min  Charges:  $Therapeutic Activity: 8-22 mins                     [name], PT  Acute Rehab Dept ([place]) [phone] Pager [phone]  04/05/2020\    [name] 04/05/2020, 11:41 AM

## 2020-04-05 NOTE — Progress Notes (Signed)
[facility]  PROGRESS NOTE  [name] MEQ:[id number] DOB: 01-13-1971 DOA: 03/25/2020 PCP: Gildardo Pounds, NP Admit date - 03/25/2020   Admitting Physician Lavina Hamman, MD  Outpatient Primary MD for the patient is Gildardo Pounds, NP  LOS - 11 Brief Narrative   [name] is a 50 y.o. year old male with medical history significant for Type 2 diabetes, HTN, CKD stage III, chronic right foot wound presenting with worsening right foot pain for the past week and after palpation evaluation by his podiatrist was directed to the ED due to concern for right foot ulcer with osteomyelitis and gas gangrene based off wound evaluation) necrotic, red and, purulent drainage) and x-ray (outpatient) presents with osteomyelitis of fifth metatarsal with multifocal subcutaneous emphysema. He underwent emergent operative debridement on 12/30 with transmetatarsal amputation. During hospital course he is remained on vancomycin, ceftriaxone, and Flagyl for Peptostreptococcus bacteremia (1 of 2 blood cultures 03/25/2020) and Staphylococcus lugdunensis surgical wound culture.   Subjective  No acute events overnight.  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 Remains afebrile, white count slowly downtrending, repeat blood cultures unremarkable. Suspect polymicrobial, surgical culture positive for Staphylococcus lugdunensis -Doxycycline for staphylococcal bacteremia, discontinue vancomycin on 1/5 -Continue p.o. Flagyl for Peptostreptococcus, discontinued ceftriaxone on 1/5 -Plan to continue oral antibiotics for 2 weeks for soft tissue infection right foot -PT recommends SNF continue monitor CBC for downtrend depending resuming wound VAC,  and will need home care for [facility].  PT recs HHPT but with arrangements with [facility] will likely get outpatient PT--[facility] assisting but due to weekend will likely have to wait till Monday when wound care center open to ensure able to go home with wound vac  Peptostreptococcus bacteremia. Repeat blood cultures on 1/30 remain negative. TTE with no vegetations -Stable antibiotics  Leukocytosis, slowly downtrending.  Stable at 19.  Remains afebrile.  No localizing signs or symptoms of infection. - Continue closely monitor CBC  Type 2 diabetes with peripheral neuropathy and hyperlipidemia. A1c 7.9. Glucose remains well controlled here -Continue Lantus 5 units, sliding scale insulin as needed -Continue gabapentin -continue statin  Pericardial effusion. Incidentally noted on TTE from 1/3. Trivial in size per report circumferential with no evidence of cardiac tamponade. Symptomatically has no dyspnea, no chest pain, no tachycardia -Closely monitor clinically  Chronic normocytic anemia, stable. Has history of iron deficiency anemia, takes iron at home. Slightly worse from baseline, likely related to postoperative changes. Previous baseline 9-10, currently 7.9-8.4. No episodes of bleeding-monitor CBC  CKD stage III, stable. Creatinine stable at baseline -Avoid nephrotoxins, monitor output  Hypertension at goal -Continue amlodipine, Coreg, clonidine -Home lisinopril held on admission  Mood disorder, stable-continue Lexapro, Wellbutrin  HLD, stable-continue Lipitor, aspirin    Family Communication  : None  Code Status : Full  Disposition Plan  :  Patient is from home. Anticipated d/c date: Medically stable for discharge. Barriers to d/c or necessity for inpatient status:  Medically stable for discharge, needs safe disposition appreciate case management assistance to ensure wound VAC when discharged home, currently wound care center closed this weekend, and need insurance auth  Consults  :  Podiatry  Procedures  : Transmetatarsal amputation 12/30  DVT Prophylaxis  :  Heparin  MDM: The below labs and imaging reports  were reviewed and summarized above.  Medication management as above.  Lab Results  Component Value Date   PLT 472 (H) 04/04/2020    Diet :  Diet Order            Diet heart healthy/carb modified Room service appropriate? Yes; Fluid consistency: Thin  Diet effective now                  Inpatient Medications Scheduled Meds: . amLODipine  10 mg Oral Daily  . aspirin EC  81 mg Oral Daily  . atorvastatin  20 mg Oral Daily  . B-complex with vitamin C  1 tablet Oral Daily  . buPROPion  150 mg Oral Daily  . carvedilol  25 mg Oral BID  . Chlorhexidine Gluconate Cloth  6 each Topical UD  . cloNIDine  0.1 mg Oral BID  . doxycycline  100 mg Oral Q12H  . escitalopram  10 mg Oral QHS  . ferrous sulfate  325 mg Oral Daily  . gabapentin  300 mg Oral TID  . heparin  5,000 Units Subcutaneous Q8H  . insulin aspart  0-6 Units Subcutaneous TID WC  . [START ON 04/06/2020] insulin glargine  2 Units Subcutaneous Daily  . metroNIDAZOLE  500 mg Oral Q8H  . multivitamin with minerals  1 tablet Oral Q24H  . nicotine  7 mg Transdermal Daily  . Ensure Max Protein  11 oz Oral QHS   Continuous Infusions: . methocarbamol (ROBAXIN) IV     PRN Meds:.acetaminophen **OR** acetaminophen, hydrALAZINE, HYDROmorphone (DILAUDID) injection, melatonin, methocarbamol (ROBAXIN) IV, ondansetron **OR** ondansetron (ZOFRAN) IV, oxyCODONE, prochlorperazine  Antibiotics  :   Anti-infectives (From admission, onward)   Start     Dose/Rate Route Frequency Ordered Stop   04/02/20 2200  doxycycline (VIBRA-TABS) tablet 100 mg        100 mg Oral Every 12 hours 04/02/20 1405     03/31/20 1000  vancomycin (VANCOCIN) IVPB 1000 mg/200 mL premix  Status:  Discontinued        1,000 mg 200 mL/hr over 60 Minutes Intravenous Every 36 hours 03/31/20 0914 04/02/20 1405   03/29/20 1800  vancomycin  (VANCOCIN) IVPB 1000 mg/200 mL premix  Status:  Discontinued        1,000 mg 200 mL/hr over 60 Minutes Intravenous Every 48 hours 03/28/20 1012 03/31/20 0914   03/27/20 1500  vancomycin (VANCOREADY) IVPB 1250 mg/250 mL  Status:  Discontinued        1,250 mg 166.7 mL/hr over 90 Minutes Intravenous Every 48 hours 03/25/20 2020 03/28/20 1012   03/27/20 0815  vancomycin (VANCOCIN) powder  Status:  Discontinued          As needed 03/27/20 0815 03/27/20 1103   03/27/20 0600  clindamycin (CLEOCIN) IVPB 900 mg        900 mg 100 mL/hr over 30 Minutes Intravenous On call to O.R. 03/26/20 1633 03/27/20 0745   03/25/20 2200  metroNIDAZOLE (FLAGYL) tablet 500 mg        500 mg Oral Every 8 hours 03/25/20 1841     03/25/20 1845  cefTRIAXone (ROCEPHIN) 2 g in sodium chloride 0.9 % 100 mL IVPB  Status:  Discontinued        2 g 200 mL/hr over 30 Minutes Intravenous Every 24 hours 03/25/20 1841 04/02/20 1508   03/25/20 1500  vancomycin (VANCOREADY) IVPB 1500 mg/300 mL        1,500 mg 150 mL/hr over 120 Minutes Intravenous  Once 03/25/20 1426  03/25/20 1744   03/25/20 1430  ceFEPIme (MAXIPIME) 2 g in sodium chloride 0.9 % 100 mL IVPB        2 g 200 mL/hr over 30 Minutes Intravenous  Once 03/25/20 1425 03/25/20 1510   03/25/20 1430  metroNIDAZOLE (FLAGYL) tablet 500 mg        500 mg Oral  Once 03/25/20 1425 03/25/20 1442       Objective   Vitals:   04/04/20 0518 04/04/20 1310 04/04/20 2045 04/05/20 0445  BP: (!) 143/83 126/76 135/80 (!) 143/86  Pulse: 79 70 76 78  Resp: 20 18 (!) 22 (!) 21  Temp: 98.2 F (36.8 C) 97.8 F (36.6 C) 98 F (36.7 C) 97.7 F (36.5 C)  TempSrc: Oral Oral Oral Oral  SpO2: 100% 100% 100% 99%  Weight:      Height:        SpO2: 99 % O2 Flow Rate (L/min): 2 L/min  Wt Readings from Last 3 Encounters:  03/25/20 90.7 kg  03/18/20 91.2 kg  03/06/20 91.4 kg     Intake/Output Summary (Last 24 hours) at 04/05/2020 1508 Last data filed at 04/05/2020 0100 Gross per 24  hour  Intake 480 ml  Output 1275 ml  Net -795 ml    Physical Exam:     Awake Alert, Oriented X 3, Normal affect No new F.N deficits,  [place].AT, Normal respiratory effort on room air, CTAB RRR,No Gallops,Rubs or new Murmurs,  +ve B.Sounds, Abd Soft, No tenderness, No rebound, guarding or rigidity. Right foot status post transmetatarsal imitation with dressing in place that is clean, dry, intact.  And wound VAC in place   I have personally reviewed the following:   Data Reviewed:  CBC Recent Labs  Lab 03/30/20 0749 03/31/20 0454 04/01/20 0412 04/02/20 0605 04/03/20 0537 04/04/20 0523  WBC 26.0* 24.3* 21.1* 19.3* 19.2* 19.4*  HGB 8.1* 7.9* 8.2* 8.4* 8.5* 7.9*  HCT 26.4* 25.4* 26.9* 26.6* 27.2* 25.3*  PLT 443* 451* 453* 498* 484* 472*  MCV 88.9 87.6 90.3 87.5 89.5 86.6  MCH 27.3 27.2 27.5 27.6 28.0 27.1  MCHC 30.7 31.1 30.5 31.6 31.3 31.2  RDW 15.1 15.2 15.3 15.8* 16.0* 16.1*  LYMPHSABS 2.1 2.1 2.8 2.9  --   --   MONOABS 1.4* 1.1* 0.9 1.1*  --   --   EOSABS 0.5 0.5 0.5 0.5  --   --   BASOSABS 0.1 0.1 0.0 0.1  --   --     Chemistries  Recent Labs  Lab 03/30/20 0749 03/31/20 0454 04/01/20 0412 04/02/20 0605 04/03/20 0537 04/04/20 0835  NA 133* 140 138 137 138 138  K 3.7 3.8 3.5 3.8 3.9 3.9  CL 109 115* 112* 111 111 110  CO2 17* 18* 18* 19* 17* 22  GLUCOSE 135* 65* 140* 139* 90 100*  BUN 29* 29* 23* 20 21* 20  CREATININE 2.45* 2.34* 2.15* 2.28* 2.48* 2.25*  CALCIUM 8.0* 8.2* 8.0* 7.9* 8.2* 8.3*  MG 1.7 1.8 1.9 1.7  --   --   AST 15  --   --  14*  --   --   ALT 15  --   --  14  --   --   ALKPHOS 60  --   --  62  --   --   BILITOT 0.2*  --   --  0.4  --   --    ------------------------------------------------------------------------------------------------------------------ No results for input(s): CHOL, HDL, LDLCALC, TRIG, CHOLHDL, LDLDIRECT in  the last 72 hours.  Lab Results  Component Value Date   HGBA1C 7.9 (H) 03/25/2020    ------------------------------------------------------------------------------------------------------------------ No results for input(s): TSH, T4TOTAL, T3FREE, THYROIDAB in the last 72 hours.  Invalid input(s): FREET3 ------------------------------------------------------------------------------------------------------------------ No results for input(s): VITAMINB12, FOLATE, FERRITIN, TIBC, IRON, RETICCTPCT in the last 72 hours.  Coagulation profile No results for input(s): INR, PROTIME in the last 168 hours.  No results for input(s): DDIMER in the last 72 hours.  Cardiac Enzymes No results for input(s): CKMB, TROPONINI, MYOGLOBIN in the last 168 hours.  Invalid input(s): CK ------------------------------------------------------------------------------------------------------------------ No results found for: BNP  Micro Results Recent Results (from the past 240 hour(s))  Aerobic/Anaerobic Culture (surgical/deep wound)     Status: None   Collection Time: 03/27/20  8:30 AM   Specimen: Foot, Right; Wound  Result Value Ref Range Status   Specimen Description   Final    FOOT RIGHT 5TH METATARSAL Performed at [place] [address] [place], [name] [address]    Special Requests   Final    NONE Performed at [facility], [place] [address], [place], [place] [address]    Gram Stain NO WBC SEEN NO ORGANISMS SEEN   Final   Culture   Final    No growth aerobically or anaerobically. Performed at [facility], [place] [address] [place], [place] [address]    Report Status 04/01/2020 FINAL  Final  Culture, blood (routine x 2)     Status: None   Collection Time: 03/31/20  4:54 AM   Specimen: BLOOD  Result Value Ref Range Status   Specimen Description   Final    BLOOD FOREARM Performed at [facility] [address] [place], [place] [address]    Special Requests   Final    BOTTLES DRAWN AEROBIC ONLY Blood Culture  adequate volume Performed at [place] [address] [place], [place] [address]    Culture   Final    NO GROWTH 5 DAYS Performed at [facility], [place] [address], [employer] [place], [place] [address]    Report Status 04/05/2020 FINAL  Final  Culture, blood (routine x 2)     Status: None   Collection Time: 03/31/20  4:54 AM   Specimen: BLOOD LEFT HAND  Result Value Ref Range Status   Specimen Description   Final    BLOOD LEFT HAND Performed at [facility] [address] [place], [place] [address]    Special Requests   Final    BOTTLES DRAWN AEROBIC ONLY Blood Culture adequate volume Performed at [facility] [address], [place], [place] [address]    Culture   Final    NO GROWTH 5 DAYS Performed at [facility], [place] [address], [place], [place] [address]    Report Status 04/05/2020 FINAL  Final    Radiology Reports DG Abd 2 Views  Result Date: 03/28/2020 CLINICAL DATA:  RIGHT foot wound. EXAM: ABDOMEN - 2 VIEW COMPARISON:  None. FINDINGS: Scattered nonspecific air-fluid levels within the small and large bowel. No dilated large or small bowel loops. No evidence of free intraperitoneal air is seen. No acute appearing osseous abnormality. IMPRESSION: 1. Scattered nonspecific air-fluid levels throughout the abdomen, involving both large and small bowel, suggesting gastroenteritis or enterocolitis of infectious or inflammatory nature. 2. No evidence of bowel obstruction. Electronically Signed   By: Franki Cabot M.D.   On: 03/28/2020 15:40  DG Foot 2 Views Right  Result Date: 03/27/2020 CLINICAL DATA:  Postop transmetatarsal amputation EXAM: RIGHT FOOT - 2 VIEW COMPARISON:  None. FINDINGS: Interval transmetatarsal amputation. No bone destruction. No periosteal reaction. No fracture or dislocation. Postsurgical changes in the overlying soft tissues. Small plantar calcaneal spur. Lateral wound VAC noted. IMPRESSION:  Interval transmetatarsal amputation. Electronically Signed   By: Kathreen Devoid   On: 03/27/2020 10:57   DG Foot 2 Views Right  Result Date: 03/25/2020 CLINICAL DATA:  Transmetatarsal amputation EXAM: RIGHT FOOT - 2 VIEW COMPARISON:  03/19/2020 FINDINGS: Previous amputation at the level of the MTP joint involving the first and third digits. Chronic dorsal displacement of the base of the second proximal phalanx with respect to the head of the metatarsal. Interval amputation of fifth digit at the level of the proximal shaft of the metatarsal. Vascular calcifications. IMPRESSION: 1. Interval amputation of fifth digit at the level of the proximal shaft of the metatarsal. 2. Previous amputation of the first and third digits. Chronic dislocation at the second MTP joint. Electronically Signed   By: Donavan Foil M.D.   On: 03/25/2020 21:26   DG Foot Complete Right  Result Date: 03/25/2020 Please see detailed radiograph report in office note.  DG Foot Complete Right  Result Date: 03/19/2020 Please see detailed radiograph report in office note.  VAS [place] ABI WITH/WO TBI  Result Date: 03/26/2020 LOWER EXTREMITY DOPPLER STUDY Indications: Gangrene, and peripheral artery disease.  Vascular Interventions: Pre-Op TMA 12/30 s/p toe amputations. Performing Technologist: Vonzell Schlatter RVT  Examination Guidelines: A complete evaluation includes at minimum, Doppler waveform signals and systolic blood pressure reading at the level of bilateral brachial, anterior tibial, and posterior tibial arteries, when vessel segments are accessible. Bilateral testing is considered an integral part of a complete examination. Photoelectric Plethysmograph (PPG) waveforms and toe systolic pressure readings are included as required and additional duplex testing as needed. Limited examinations for reoccurring indications may be performed as noted.  ABI Findings: +---------+------------------+-----+---------+---------------------+ Right     Rt Pressure (mmHg)IndexWaveform Comment               +---------+------------------+-----+---------+---------------------+ Brachial 125                    triphasic                      +---------+------------------+-----+---------+---------------------+ PTA      154               1.23 triphasic                      +---------+------------------+-----+---------+---------------------+ DP       164               1.31 triphasic                      +---------+------------------+-----+---------+---------------------+ Great Toe                                recent toes amputated +---------+------------------+-----+---------+---------------------+ +--------+------------------+-----+---------+---------------+ Left    Lt Pressure (mmHg)IndexWaveform Comment         +--------+------------------+-----+---------+---------------+ Brachial                       triphasicIV and bandages +--------+------------------+-----+---------+---------------+ PTA     121  0.97 triphasic                +--------+------------------+-----+---------+---------------+ DP      140               1.12 triphasic                +--------+------------------+-----+---------+---------------+  Summary: Right: Resting right ankle-brachial index indicates noncompressible right lower extremity arteries. Left: Resting left ankle-brachial index is within normal range. No evidence of significant left lower extremity arterial disease.  *See table(s) above for measurements and observations.  Electronically signed by Jamelle Haring on 03/26/2020 at 5:30:30 PM.   Final    MYOCARDIAL PERFUSION IMAGING  Result Date: 03/18/2020  Nuclear stress EF: 55%.  There was no ST segment deviation noted during stress.  The study is normal.  The left ventricular ejection fraction is normal (55-65%).  This is a low risk study.  Gwyndolyn Kaufman, MD   ECHOCARDIOGRAM COMPLETE  Result Date: 03/31/2020     ECHOCARDIOGRAM REPORT   Patient Name:   ABU HEAVIN Date of Exam: 03/31/2020 Medical Rec #:  [id number]        Height:       64.0 in Accession #:    [id number]       Weight:       200.0 lb Date of Birth:  February 28, 1971        BSA:          1.956 m Patient Age:    19 years         BP:           141/76 mmHg Patient Gender: M                HR:           77 bpm. Exam Location:  Inpatient Procedure: 2D Echo, Cardiac Doppler and Color Doppler Indications:    Bacteremia  History:        Patient has prior history of Echocardiogram examinations, most                 recent 01/22/2020. Signs/Symptoms:Bacteremia; Risk                 Factors:Diabetes, Current Smoker and Hypertension.  Sonographer:    Roseanna Rainbow RDCS Referring Phys: [id number] [facility]  Sonographer Comments: Technically difficult study due to poor echo windows. IMPRESSIONS  1. Left ventricular ejection fraction, by estimation, is 60 to 65%. Left ventricular ejection fraction by 2D MOD biplane is 64.1 %. Left ventricular ejection fraction by PLAX is 65 %. The left ventricle has normal function. The left ventricle has no regional wall motion abnormalities. There is mild concentric left ventricular hypertrophy. Left ventricular diastolic parameters are consistent with Grade I diastolic dysfunction (impaired relaxation).  2. Right ventricular systolic function is normal. The right ventricular size is normal.  3. The pericardial effusion is circumferential and posterior to the left ventricle. There is no evidence of cardiac tamponade.  4. The mitral valve is normal in structure. No evidence of mitral valve regurgitation. No evidence of mitral stenosis.  5. The aortic valve was not well visualized. Aortic valve regurgitation is not visualized. No aortic stenosis is present.  6. The inferior vena cava is normal in size with greater than 50% respiratory variability, suggesting right atrial pressure of 3 mmHg. FINDINGS  Left Ventricle: Left ventricular ejection  fraction, by estimation, is 60 to 65%. Left ventricular ejection fraction by PLAX is 65 %.  Left ventricular ejection fraction by 2D MOD biplane is 64.1 %. The left ventricle has normal function. The left ventricle has no regional wall motion abnormalities. The left ventricular internal cavity size was normal in size. There is mild concentric left ventricular hypertrophy. Left ventricular diastolic parameters are consistent with Grade I diastolic dysfunction (impaired relaxation). Indeterminate filling pressures. Right Ventricle: The right ventricular size is normal. No increase in right ventricular wall thickness. Right ventricular systolic function is normal. Left Atrium: Left atrial size was normal in size. Right Atrium: Right atrial size was normal in size. Pericardium: Trivial pericardial effusion is present. The pericardial effusion is circumferential and posterior to the left ventricle. There is no evidence of cardiac tamponade. Mitral Valve: The mitral valve is normal in structure. No evidence of mitral valve regurgitation. No evidence of mitral valve stenosis. Tricuspid Valve: The tricuspid valve is normal in structure. Tricuspid valve regurgitation is trivial. No evidence of tricuspid stenosis. Aortic Valve: The aortic valve was not well visualized. Aortic valve regurgitation is not visualized. No aortic stenosis is present. Pulmonic Valve: The pulmonic valve was normal in structure. Pulmonic valve regurgitation is not visualized. No evidence of pulmonic stenosis. Aorta: The aortic root is normal in size and structure. Venous: The inferior vena cava is normal in size with greater than 50% respiratory variability, suggesting right atrial pressure of 3 mmHg. IAS/Shunts: No atrial level shunt detected by color flow Doppler.  LEFT VENTRICLE PLAX 2D                        Biplane EF (MOD) LV EF:         Left            LV Biplane EF:   Left                ventricular                      ventricular                 ejection                         ejection                fraction by                      fraction by                PLAX is 65                       2D MOD                %.                               biplane is LVIDd:         3.70 cm                          64.1 %. LVIDs:         2.40 cm LV PW:         1.33 cm         Diastology LV IVS:        1.17 cm  LV e' medial:    9.46 cm/s LVOT diam:     1.90 cm         LV E/e' medial:  10.1 LV SV:         69              LV e' lateral:   8.38 cm/s LV SV Index:   35              LV E/e' lateral: 11.4 LVOT Area:     2.84 cm  LV Volumes (MOD) LV vol d, MOD    93.7 ml A2C: LV vol d, MOD    75.8 ml A4C: LV vol s, MOD    30.8 ml A2C: LV vol s, MOD    27.8 ml A4C: LV SV MOD A2C:   62.9 ml LV SV MOD A4C:   75.8 ml LV SV MOD BP:    54.9 ml RIGHT VENTRICLE             IVC RV S prime:     12.00 cm/s  IVC diam: 1.50 cm TAPSE (M-mode): 2.1 cm LEFT ATRIUM           Index       RIGHT ATRIUM          Index LA diam:      3.70 cm 1.89 cm/m  RA Area:     9.46 cm LA Vol (A2C): 40.6 ml 20.75 ml/m RA Volume:   17.70 ml 9.05 ml/m LA Vol (A4C): 26.4 ml 13.49 ml/m  AORTIC VALVE LVOT Vmax:   111.00 cm/s LVOT Vmean:  75.400 cm/s LVOT VTI:    0.243 m  AORTA Ao Root diam: 3.20 cm MITRAL VALVE MV Area (PHT): 3.53 cm    SHUNTS MV Decel Time: 215 msec    Systemic VTI:  0.24 m MV E velocity: 95.50 cm/s  Systemic Diam: 1.90 cm MV A velocity: 81.00 cm/s MV E/A ratio:  1.18 Eleonore Chiquito MD Electronically signed by Eleonore Chiquito MD Signature Date/Time: 03/31/2020/3:17:08 PM    Final      Time Spent in minutes  30     Desiree Hane M.D on 04/05/2020 at 3:08 PM  To page go to [email or web address] - password [facility]

## 2020-04-06 DIAGNOSIS — E11621 Type 2 diabetes mellitus with foot ulcer: Secondary | ICD-10-CM | POA: Diagnosis not present

## 2020-04-06 DIAGNOSIS — L97422 Non-pressure chronic ulcer of left heel and midfoot with fat layer exposed: Secondary | ICD-10-CM | POA: Diagnosis not present

## 2020-04-06 DIAGNOSIS — E0843 Diabetes mellitus due to underlying condition with diabetic autonomic (poly)neuropathy: Secondary | ICD-10-CM | POA: Diagnosis not present

## 2020-04-06 NOTE — Progress Notes (Signed)
[facility]  PROGRESS NOTE  [name] EQA:[id number] DOB: 1970/08/07 DOA: 03/25/2020 PCP: Gildardo Pounds, NP Admit date - 03/25/2020   Admitting Physician Lavina Hamman, MD  Outpatient Primary MD for the patient is Gildardo Pounds, NP  LOS - 12 Brief Narrative   [name] is a 50 y.o. year old male with medical history significant for Type 2 diabetes, HTN, CKD stage III, chronic right foot wound presenting with worsening right foot pain for the past week and after palpation evaluation by his podiatrist was directed to the ED due to concern for right foot ulcer with osteomyelitis and gas gangrene based off wound evaluation) necrotic, red and, purulent drainage) and x-ray (outpatient) presents with osteomyelitis of fifth metatarsal with multifocal subcutaneous emphysema. He underwent emergent operative debridement on 12/30 with transmetatarsal amputation. During hospital course he is remained on vancomycin, ceftriaxone, and Flagyl for Peptostreptococcus bacteremia (1 of 2 blood cultures 03/25/2020) and Staphylococcus lugdunensis surgical wound culture.   Subjective  No acute events overnight.  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 Remains afebrile, white count slowly downtrending, repeat blood cultures unremarkable. Suspect polymicrobial, surgical culture positive for Staphylococcus lugdunensis -Doxycycline for staphylococcal bacteremia, discontinue vancomycin on 1/5 -Continue p.o. Flagyl for Peptostreptococcus, discontinued ceftriaxone on 1/5 -Plan to continue oral antibiotics for 2 weeks for soft tissue infection right foot -PT recommends SNF continue monitor CBC for downtrend depending resuming wound VAC, and  will need home care for [facility]  PT recs HHPT--TOC was able to arrange home health therapy with home health RN, and PT --TOC assisting with obtaining insurance Auth for initiating home wound VAC but needs signature from his podiatrist will likely have to wait till Monday   Peptostreptococcus bacteremia. Repeat blood cultures on 1/30 remain negative. TTE with no vegetations -Stable antibiotics  Leukocytosis, slowly downtrending.  Stable at 19.  Remains afebrile.  No localizing signs or symptoms of infection. - Continue closely monitor CBC  Type 2 diabetes with peripheral neuropathy and hyperlipidemia. A1c 7.9. Glucose remains well controlled here -Continue Lantus 5 units, sliding scale insulin as needed -Continue gabapentin -continue statin  Pericardial effusion. Incidentally noted on TTE from 1/3. Trivial in size per report circumferential with no evidence of cardiac tamponade. Symptomatically has no dyspnea, no chest pain, no tachycardia -Closely monitor clinically  Chronic normocytic anemia, stable. Has history of iron deficiency anemia, takes iron at home. Slightly worse from baseline, likely related to postoperative changes. Previous baseline 9-10, currently 7.9-8.4. No episodes of bleeding -monitor CBC  CKD stage III, stable. Creatinine stable at baseline -Avoid nephrotoxins, monitor output  Hypertension at goal -Continue amlodipine, Coreg, clonidine -Home lisinopril held on admission  Mood disorder, stable-continue Lexapro, Wellbutrin  HLD, stable-continue Lipitor, aspirin    Family Communication  : None  Code Status : Full  Disposition Plan  :  Patient is from home. Anticipated d/c date: Medically stable for discharge. Barriers to d/c or necessity for inpatient status:  Medically stable for discharge, but needs safe disposition appreciate case management assistance to ensure wound VAC when discharged home via insurance auth needing signature from his podiatrist, currently  wound care center closed this weekend  Consults  : Podiatry  Procedures  : Transmetatarsal amputation 12/30  DVT Prophylaxis  :  Heparin  MDM: The  below labs and imaging reports were reviewed and summarized above.  Medication management as above.  Lab Results  Component Value Date   PLT 472 (H) 04/04/2020    Diet :  Diet Order            Diet heart healthy/carb modified Room service appropriate? Yes; Fluid consistency: Thin  Diet effective now                  Inpatient Medications Scheduled Meds: . amLODipine  10 mg Oral Daily  . aspirin EC  81 mg Oral Daily  . atorvastatin  20 mg Oral Daily  . B-complex with vitamin C  1 tablet Oral Daily  . buPROPion  150 mg Oral Daily  . carvedilol  25 mg Oral BID  . Chlorhexidine Gluconate Cloth  6 each Topical UD  . cloNIDine  0.1 mg Oral BID  . doxycycline  100 mg Oral Q12H  . escitalopram  10 mg Oral QHS  . ferrous sulfate  325 mg Oral Daily  . gabapentin  300 mg Oral TID  . heparin  5,000 Units Subcutaneous Q8H  . insulin aspart  0-6 Units Subcutaneous TID WC  . insulin glargine  2 Units Subcutaneous Daily  . metroNIDAZOLE  500 mg Oral Q8H  . multivitamin with minerals  1 tablet Oral Q24H  . nicotine  7 mg Transdermal Daily  . Ensure Max Protein  11 oz Oral QHS   Continuous Infusions: . methocarbamol (ROBAXIN) IV     PRN Meds:.acetaminophen **OR** acetaminophen, hydrALAZINE, HYDROmorphone (DILAUDID) injection, melatonin, methocarbamol (ROBAXIN) IV, ondansetron **OR** ondansetron (ZOFRAN) IV, oxyCODONE, prochlorperazine  Antibiotics  :   Anti-infectives (From admission, onward)   Start     Dose/Rate Route Frequency Ordered Stop   04/02/20 2200  doxycycline (VIBRA-TABS) tablet 100 mg        100 mg Oral Every 12 hours 04/02/20 1405     03/31/20 1000  vancomycin (VANCOCIN) IVPB 1000 mg/200 mL premix  Status:  Discontinued        1,000 mg 200 mL/hr over 60 Minutes Intravenous Every 36 hours 03/31/20 0914 04/02/20 1405    03/29/20 1800  vancomycin (VANCOCIN) IVPB 1000 mg/200 mL premix  Status:  Discontinued        1,000 mg 200 mL/hr over 60 Minutes Intravenous Every 48 hours 03/28/20 1012 03/31/20 0914   03/27/20 1500  vancomycin (VANCOREADY) IVPB 1250 mg/250 mL  Status:  Discontinued        1,250 mg 166.7 mL/hr over 90 Minutes Intravenous Every 48 hours 03/25/20 2020 03/28/20 1012   03/27/20 0815  vancomycin (VANCOCIN) powder  Status:  Discontinued          As needed 03/27/20 0815 03/27/20 1103   03/27/20 0600  clindamycin (CLEOCIN) IVPB 900 mg        900 mg 100 mL/hr over 30 Minutes Intravenous On call to O.R. 03/26/20 1633 03/27/20 0745   03/25/20 2200  metroNIDAZOLE (FLAGYL) tablet 500 mg        500 mg Oral Every 8 hours 03/25/20 1841     03/25/20 1845  cefTRIAXone (ROCEPHIN) 2 g in sodium chloride 0.9 % 100 mL IVPB  Status:  Discontinued        2 g 200 mL/hr over 30 Minutes Intravenous Every 24 hours 03/25/20 1841 04/02/20 1508   03/25/20 1500  vancomycin (VANCOREADY) IVPB 1500 mg/300 mL        1,500 mg 150 mL/hr over 120 Minutes Intravenous  Once  03/25/20 1426 03/25/20 1744   03/25/20 1430  ceFEPIme (MAXIPIME) 2 g in sodium chloride 0.9 % 100 mL IVPB        2 g 200 mL/hr over 30 Minutes Intravenous  Once 03/25/20 1425 03/25/20 1510   03/25/20 1430  metroNIDAZOLE (FLAGYL) tablet 500 mg        500 mg Oral  Once 03/25/20 1425 03/25/20 1442       Objective   Vitals:   04/05/20 0445 04/05/20 2102 04/06/20 0505 04/06/20 1217  BP: (!) 143/86 126/84 140/83 126/77  Pulse: 78 72 80 77  Resp: (!) 21 18 18 17   Temp: 97.7 F (36.5 C) 97.7 F (36.5 C) 98.6 F (37 C) 98.7 F (37.1 C)  TempSrc: Oral Oral Oral Oral  SpO2: 99% 100% 99% 99%  Weight:      Height:        SpO2: 99 % O2 Flow Rate (L/min): 2 L/min  Wt Readings from Last 3 Encounters:  03/25/20 90.7 kg  03/18/20 91.2 kg  03/06/20 91.4 kg     Intake/Output Summary (Last 24 hours) at 04/06/2020 1701 Last data filed at 04/06/2020  1656 Gross per 24 hour  Intake 504 ml  Output 1700 ml  Net -1196 ml    Physical Exam:     Awake Alert, Oriented X 3, Normal affect No new F.N deficits,  [place].AT, Normal respiratory effort on room air, Right foot status post transmetatarsal imitation with dressing in place that is clean, dry, intact.  And wound VAC in place   I have personally reviewed the following:   Data Reviewed:  CBC Recent Labs  Lab 03/31/20 0454 04/01/20 0412 04/02/20 0605 04/03/20 0537 04/04/20 0523  WBC 24.3* 21.1* 19.3* 19.2* 19.4*  HGB 7.9* 8.2* 8.4* 8.5* 7.9*  HCT 25.4* 26.9* 26.6* 27.2* 25.3*  PLT 451* 453* 498* 484* 472*  MCV 87.6 90.3 87.5 89.5 86.6  MCH 27.2 27.5 27.6 28.0 27.1  MCHC 31.1 30.5 31.6 31.3 31.2  RDW 15.2 15.3 15.8* 16.0* 16.1*  LYMPHSABS 2.1 2.8 2.9  --   --   MONOABS 1.1* 0.9 1.1*  --   --   EOSABS 0.5 0.5 0.5  --   --   BASOSABS 0.1 0.0 0.1  --   --     Chemistries  Recent Labs  Lab 03/31/20 0454 04/01/20 0412 04/02/20 0605 04/03/20 0537 04/04/20 0835  NA 140 138 137 138 138  K 3.8 3.5 3.8 3.9 3.9  CL 115* 112* 111 111 110  CO2 18* 18* 19* 17* 22  GLUCOSE 65* 140* 139* 90 100*  BUN 29* 23* 20 21* 20  CREATININE 2.34* 2.15* 2.28* 2.48* 2.25*  CALCIUM 8.2* 8.0* 7.9* 8.2* 8.3*  MG 1.8 1.9 1.7  --   --   AST  --   --  14*  --   --   ALT  --   --  14  --   --   ALKPHOS  --   --  62  --   --   BILITOT  --   --  0.4  --   --    ------------------------------------------------------------------------------------------------------------------ No results for input(s): CHOL, HDL, LDLCALC, TRIG, CHOLHDL, LDLDIRECT in the last 72 hours.  Lab Results  Component Value Date   HGBA1C 7.9 (H) 03/25/2020   ------------------------------------------------------------------------------------------------------------------ No results for input(s): TSH, T4TOTAL, T3FREE, THYROIDAB in the last 72 hours.  Invalid input(s):  FREET3 ------------------------------------------------------------------------------------------------------------------ No results for input(s): VITAMINB12, FOLATE, FERRITIN, TIBC,  IRON, RETICCTPCT in the last 72 hours.  Coagulation profile No results for input(s): INR, PROTIME in the last 168 hours.  No results for input(s): DDIMER in the last 72 hours.  Cardiac Enzymes No results for input(s): CKMB, TROPONINI, MYOGLOBIN in the last 168 hours.  Invalid input(s): CK ------------------------------------------------------------------------------------------------------------------ No results found for: BNP  Micro Results Recent Results (from the past 240 hour(s))  Culture, blood (routine x 2)     Status: None   Collection Time: 03/31/20  4:54 AM   Specimen: BLOOD  Result Value Ref Range Status   Specimen Description   Final    BLOOD FOREARM Performed at [facility] [address], [place], [place] [address]    Special Requests   Final    BOTTLES DRAWN AEROBIC ONLY Blood Culture adequate volume Performed at [facility] [address], [place], [place] [address]    Culture   Final    NO GROWTH 5 DAYS Performed at [facility], [place] [address], [place], [place] [address]    Report Status 04/05/2020 FINAL  Final  Culture, blood (routine x 2)     Status: None   Collection Time: 03/31/20  4:54 AM   Specimen: BLOOD LEFT HAND  Result Value Ref Range Status   Specimen Description   Final    BLOOD LEFT HAND Performed at [facility] [address], [place], [place] [address]    Special Requests   Final    BOTTLES DRAWN AEROBIC ONLY Blood Culture adequate volume Performed at [facility] [address], [place], [place] [address]    Culture   Final    NO GROWTH 5 DAYS Performed at [facility], [place] [address], [place], [place] [address]    Report Status 04/05/2020 FINAL  Final     Radiology Reports DG Abd 2 Views  Result Date: 03/28/2020 CLINICAL DATA:  RIGHT foot wound. EXAM: ABDOMEN - 2 VIEW COMPARISON:  None. FINDINGS: Scattered nonspecific air-fluid levels within the small and large bowel. No dilated large or small bowel loops. No evidence of free intraperitoneal air is seen. No acute appearing osseous abnormality. IMPRESSION: 1. Scattered nonspecific air-fluid levels throughout the abdomen, involving both large and small bowel, suggesting gastroenteritis or enterocolitis of infectious or inflammatory nature. 2. No evidence of bowel obstruction. Electronically Signed   By: Franki Cabot M.D.   On: 03/28/2020 15:40   DG Foot 2 Views Right  Result Date: 03/27/2020 CLINICAL DATA:  Postop transmetatarsal amputation EXAM: RIGHT FOOT - 2 VIEW COMPARISON:  None. FINDINGS: Interval transmetatarsal amputation. No bone destruction. No periosteal reaction. No fracture or dislocation. Postsurgical changes in the overlying soft tissues. Small plantar calcaneal spur. Lateral wound VAC noted. IMPRESSION: Interval transmetatarsal amputation. Electronically Signed   By: Kathreen Devoid   On: 03/27/2020 10:57   DG Foot 2 Views Right  Result Date: 03/25/2020 CLINICAL DATA:  Transmetatarsal amputation EXAM: RIGHT FOOT - 2 VIEW COMPARISON:  03/19/2020 FINDINGS: Previous amputation at the level of the MTP joint involving the first and third digits. Chronic dorsal displacement of the base of the second proximal phalanx with respect to the head of the metatarsal. Interval amputation of fifth digit at the level of the proximal shaft of the metatarsal. Vascular calcifications. IMPRESSION: 1. Interval amputation of fifth digit at the level of the proximal shaft of the metatarsal. 2. Previous amputation of the first and third digits. Chronic dislocation at the second MTP  joint. Electronically Signed   By: Donavan Foil M.D.   On: 03/25/2020 21:26   DG Foot Complete Right  Result Date:  03/25/2020 Please see detailed radiograph report in office note.  DG Foot Complete Right  Result Date: 03/19/2020 Please see detailed radiograph report in office note.  VAS [place] ABI WITH/WO TBI  Result Date: 03/26/2020 LOWER EXTREMITY DOPPLER STUDY Indications: Gangrene, and peripheral artery disease.  Vascular Interventions: Pre-Op TMA 12/30 s/p toe amputations. Performing Technologist: Vonzell Schlatter RVT  Examination Guidelines: A complete evaluation includes at minimum, Doppler waveform signals and systolic blood pressure reading at the level of bilateral brachial, anterior tibial, and posterior tibial arteries, when vessel segments are accessible. Bilateral testing is considered an integral part of a complete examination. Photoelectric Plethysmograph (PPG) waveforms and toe systolic pressure readings are included as required and additional duplex testing as needed. Limited examinations for reoccurring indications may be performed as noted.  ABI Findings: +---------+------------------+-----+---------+---------------------+ Right    Rt Pressure (mmHg)IndexWaveform Comment               +---------+------------------+-----+---------+---------------------+ Brachial 125                    triphasic                      +---------+------------------+-----+---------+---------------------+ PTA      154               1.23 triphasic                      +---------+------------------+-----+---------+---------------------+ DP       164               1.31 triphasic                      +---------+------------------+-----+---------+---------------------+ Great Toe                                recent toes amputated +---------+------------------+-----+---------+---------------------+ +--------+------------------+-----+---------+---------------+ Left    Lt Pressure (mmHg)IndexWaveform Comment         +--------+------------------+-----+---------+---------------+ Brachial                        triphasicIV and bandages +--------+------------------+-----+---------+---------------+ PTA     121               0.97 triphasic                +--------+------------------+-----+---------+---------------+ DP      140               1.12 triphasic                +--------+------------------+-----+---------+---------------+  Summary: Right: Resting right ankle-brachial index indicates noncompressible right lower extremity arteries. Left: Resting left ankle-brachial index is within normal range. No evidence of significant left lower extremity arterial disease.  *See table(s) above for measurements and observations.  Electronically signed by Jamelle Haring on 03/26/2020 at 5:30:30 PM.   Final    MYOCARDIAL PERFUSION IMAGING  Result Date: 03/18/2020  Nuclear stress EF: 55%.  There was no ST segment deviation noted during stress.  The study is normal.  The left ventricular ejection fraction is normal (55-65%).  This is a low risk study.  Gwyndolyn Kaufman, MD   ECHOCARDIOGRAM COMPLETE  Result Date: 03/31/2020    ECHOCARDIOGRAM REPORT   Patient  Name:   MAYFORD ALBERG Date of Exam: 03/31/2020 Medical Rec #:  [id number]        Height:       64.0 in Accession #:    [id number]       Weight:       200.0 lb Date of Birth:  1971/02/12        BSA:          1.956 m Patient Age:    88 years         BP:           141/76 mmHg Patient Gender: M                HR:           77 bpm. Exam Location:  Inpatient Procedure: 2D Echo, Cardiac Doppler and Color Doppler Indications:    Bacteremia  History:        Patient has prior history of Echocardiogram examinations, most                 recent 01/22/2020. Signs/Symptoms:Bacteremia; Risk                 Factors:Diabetes, Current Smoker and Hypertension.  Sonographer:    Roseanna Rainbow RDCS Referring Phys: [id number] [facility]  Sonographer Comments: Technically difficult study due to poor echo windows. IMPRESSIONS  1. Left ventricular ejection fraction, by  estimation, is 60 to 65%. Left ventricular ejection fraction by 2D MOD biplane is 64.1 %. Left ventricular ejection fraction by PLAX is 65 %. The left ventricle has normal function. The left ventricle has no regional wall motion abnormalities. There is mild concentric left ventricular hypertrophy. Left ventricular diastolic parameters are consistent with Grade I diastolic dysfunction (impaired relaxation).  2. Right ventricular systolic function is normal. The right ventricular size is normal.  3. The pericardial effusion is circumferential and posterior to the left ventricle. There is no evidence of cardiac tamponade.  4. The mitral valve is normal in structure. No evidence of mitral valve regurgitation. No evidence of mitral stenosis.  5. The aortic valve was not well visualized. Aortic valve regurgitation is not visualized. No aortic stenosis is present.  6. The inferior vena cava is normal in size with greater than 50% respiratory variability, suggesting right atrial pressure of 3 mmHg. FINDINGS  Left Ventricle: Left ventricular ejection fraction, by estimation, is 60 to 65%. Left ventricular ejection fraction by PLAX is 65 %. Left ventricular ejection fraction by 2D MOD biplane is 64.1 %. The left ventricle has normal function. The left ventricle has no regional wall motion abnormalities. The left ventricular internal cavity size was normal in size. There is mild concentric left ventricular hypertrophy. Left ventricular diastolic parameters are consistent with Grade I diastolic dysfunction (impaired relaxation). Indeterminate filling pressures. Right Ventricle: The right ventricular size is normal. No increase in right ventricular wall thickness. Right ventricular systolic function is normal. Left Atrium: Left atrial size was normal in size. Right Atrium: Right atrial size was normal in size. Pericardium: Trivial pericardial effusion is present. The pericardial effusion is circumferential and posterior to the  left ventricle. There is no evidence of cardiac tamponade. Mitral Valve: The mitral valve is normal in structure. No evidence of mitral valve regurgitation. No evidence of mitral valve stenosis. Tricuspid Valve: The tricuspid valve is normal in structure. Tricuspid valve regurgitation is trivial. No evidence of tricuspid stenosis. Aortic Valve: The aortic valve was not well visualized. Aortic valve regurgitation is not  visualized. No aortic stenosis is present. Pulmonic Valve: The pulmonic valve was normal in structure. Pulmonic valve regurgitation is not visualized. No evidence of pulmonic stenosis. Aorta: The aortic root is normal in size and structure. Venous: The inferior vena cava is normal in size with greater than 50% respiratory variability, suggesting right atrial pressure of 3 mmHg. IAS/Shunts: No atrial level shunt detected by color flow Doppler.  LEFT VENTRICLE PLAX 2D                        Biplane EF (MOD) LV EF:         Left            LV Biplane EF:   Left                ventricular                      ventricular                ejection                         ejection                fraction by                      fraction by                PLAX is 65                       2D MOD                %.                               biplane is LVIDd:         3.70 cm                          64.1 %. LVIDs:         2.40 cm LV PW:         1.33 cm         Diastology LV IVS:        1.17 cm         LV e' medial:    9.46 cm/s LVOT diam:     1.90 cm         LV E/e' medial:  10.1 LV SV:         69              LV e' lateral:   8.38 cm/s LV SV Index:   35              LV E/e' lateral: 11.4 LVOT Area:     2.84 cm  LV Volumes (MOD) LV vol d, MOD    93.7 ml A2C: LV vol d, MOD    75.8 ml A4C: LV vol s, MOD    30.8 ml A2C: LV vol s, MOD    27.8 ml A4C: LV SV MOD A2C:   62.9 ml LV SV MOD A4C:   75.8 ml LV SV MOD BP:    54.9 ml RIGHT VENTRICLE             IVC  RV S prime:     12.00 cm/s  IVC diam: 1.50 cm TAPSE  (M-mode): 2.1 cm LEFT ATRIUM           Index       RIGHT ATRIUM          Index LA diam:      3.70 cm 1.89 cm/m  RA Area:     9.46 cm LA Vol (A2C): 40.6 ml 20.75 ml/m RA Volume:   17.70 ml 9.05 ml/m LA Vol (A4C): 26.4 ml 13.49 ml/m  AORTIC VALVE LVOT Vmax:   111.00 cm/s LVOT Vmean:  75.400 cm/s LVOT VTI:    0.243 m  AORTA Ao Root diam: 3.20 cm MITRAL VALVE MV Area (PHT): 3.53 cm    SHUNTS MV Decel Time: 215 msec    Systemic VTI:  0.24 m MV E velocity: 95.50 cm/s  Systemic Diam: 1.90 cm MV A velocity: 81.00 cm/s MV E/A ratio:  1.18 Eleonore Chiquito MD Electronically signed by Eleonore Chiquito MD Signature Date/Time: 03/31/2020/3:17:08 PM    Final      Time Spent in minutes  30     Desiree Hane M.D on 04/06/2020 at 5:01 PM  To page go to [email or web address] - password [email or web address]

## 2020-04-06 NOTE — TOC Progression Note (Signed)
Transition of Care [facility]) - Progression Note    Patient Details  Name: [name] MRN: [id number] Date of Birth: [birth year]  [facility] CM/SW Contact  Joaquin Courts, RN Phone Number: 04/06/2020, 1:51 PM  Clinical Narrative:    CM attempted to begin the process for patient to receive wound vac for home use.  Vac therapy insurance auth form initiated but requires clinical information and signature supplies by MD.  Podiatry on-call service contacted, however on-call provider is unfamiliar with wound and would not be able to provide needed information.  On-call provide will attempt to reach [name] and notify this form needs to be completed.  Once form is completed it will need to be sent to [facility] for processing.  TOC will continue to follow.   Expected Discharge Plan: [place] Barriers to Discharge: No Barriers Identified  Expected Discharge Plan and Services Expected Discharge Plan: [place] arrangements for the past 2 months: Single Family Home                                       Social Determinants of Health (SDOH) Interventions    Readmission Risk Interventions No flowsheet data found.

## 2020-04-06 NOTE — TOC Progression Note (Signed)
Transition of Care [facility]) - Progression Note    Patient Details  Name: [name] MRN: [id number] Date of Birth: 1970/12/25  Transition [facility]) CM/SW Contact  Joaquin Courts, RN Phone Number: 04/06/2020, 2:23 PM  Clinical Narrative:    HHPT/RN services to be provided by [facility], rep Corene Cornea given referral.    Expected Discharge Plan: [place] Barriers to Discharge: No Barriers Identified  Expected Discharge Plan and Services Expected Discharge Plan: [place] arrangements for the past 2 months: [place]: PT,RN [place]: [place] ([place]) Date [place]: 04/06/20 Time [place] [name]: 1422 Representative spoke with at [place]: [place] ([place]) Interventions    Readmission Risk Interventions No flowsheet data found.

## 2020-04-07 DIAGNOSIS — E0843 Diabetes mellitus due to underlying condition with diabetic autonomic (poly)neuropathy: Secondary | ICD-10-CM | POA: Diagnosis not present

## 2020-04-07 DIAGNOSIS — M86171 Other acute osteomyelitis, right ankle and foot: Secondary | ICD-10-CM | POA: Diagnosis not present

## 2020-04-07 LAB — GLUCOSE, CAPILLARY
Glucose-Capillary: 120 mg/dL — ABNORMAL HIGH (ref 70–99)
Glucose-Capillary: 94 mg/dL (ref 70–99)
Glucose-Capillary: 70 mg/dL (ref 70–99)
Glucose-Capillary: 159 mg/dL — ABNORMAL HIGH (ref 70–99)
Glucose-Capillary: 153 mg/dL — ABNORMAL HIGH (ref 70–99)
Glucose-Capillary: 141 mg/dL — ABNORMAL HIGH (ref 70–99)
Glucose-Capillary: 93 mg/dL (ref 70–99)
Glucose-Capillary: 88 mg/dL (ref 70–99)
Glucose-Capillary: 96 mg/dL (ref 70–99)
Glucose-Capillary: 97 mg/dL (ref 70–99)
Glucose-Capillary: 60 mg/dL — ABNORMAL LOW (ref 70–99)
Glucose-Capillary: 90 mg/dL (ref 70–99)
Glucose-Capillary: 108 mg/dL — ABNORMAL HIGH (ref 70–99)

## 2020-04-07 NOTE — Progress Notes (Signed)
Pt alert and aware sitting up in chair. Pt states he is doing better and is looking forward to going home soon.  He said he has been here for two weeks. He talked about how much he misses home. The chaplain offered caring and supportive presence and listening ear. Prayers and blessings were offered and follow-up visits for as long as he remains a pt here.

## 2020-04-07 NOTE — Progress Notes (Signed)
Physical Therapy Treatment Patient Details Name: [name] AGE MRN: [id number] DOB: January 28, 1971 Today's Date: 04/07/2020    History of Present Illness 50 year old male with past medical history of diabetes mellitus type 2, prior history of amputation L foot (great toe amp), hypertension, CKD stage III presented with worsening right foot. He had foul-smelling discharge coming from the R foot wound.  He was seen by podiatry and is now s/p TMA R  foot with wound vac placement    PT Comments    Pt preferred to amb only in room today. Then reviewed stand pivot transfers. Pt continues to progress and is hopeful to go home. Pt asking about driving, have advised him to speak with his MD.  Again reviewed importance of bed to chair transfers only for safety until HHPT advises ok to amb.   Follow Up Recommendations  Home health PT;Other (comment) (HHOT)     Equipment Recommendations  None recommended by PT    Recommendations for Other Services       Precautions / Restrictions Precautions Precautions: Fall Precaution Comments: NPWT R foot Required Braces or Orthoses: Other Brace Other Brace: cam boot on right Restrictions Weight Bearing Restrictions: No Other Position/Activity Restrictions: WBAT in camboot    Mobility  Bed Mobility Overal bed mobility: Needs Assistance Bed Mobility: Supine to Sit     Supine to sit: Modified independent (Device/Increase time)     General bed mobility comments: no physical assist  Transfers Overall transfer level: Needs assistance Equipment used: Rolling walker (2 wheeled) Transfers: Sit to/from [facility] Sit to Stand: Supervision Stand pivot transfers: Supervision       General transfer comment: for safety only. cues to stay in frame of walker for stand pivot , no physical assist (to simulate pivot to w/c at home)  Ambulation/Gait Ambulation/Gait assistance: Min guard Gait Distance (Feet): 20 Feet Assistive device:  Rolling walker (2 wheeled) Gait Pattern/deviations: Step-to pattern;Decreased stance time - right     General Gait Details: multi-modal cues for RW position, step length/step to gait. steadying assist however no overt LOB   Stairs             Wheelchair Mobility    Modified Rankin (Stroke Patients Only)       Balance     Sitting balance-Leahy Scale: Good       Standing balance-Leahy Scale: Poor Standing balance comment: reliant on UEs                            Cognition Arousal/Alertness: Awake/alert Behavior During Therapy: WFL for tasks assessed/performed Overall Cognitive Status: Within Functional Limits for tasks assessed                                        Exercises      General Comments        Pertinent Vitals/Pain Pain Assessment: No/denies pain    Home Living                      Prior Function            PT Goals (current goals can now be found in the care plan section) Acute Rehab PT Goals Patient Stated Goal: home PT Goal Formulation: With patient Time For Goal Achievement: 04/12/20 Potential to Achieve Goals: Good Progress towards PT goals: Progressing toward  goals    Frequency    Min 3X/week      PT Plan Current plan remains appropriate    Co-evaluation              AM-PAC PT "6 Clicks" Mobility   Outcome Measure  Help needed turning from your back to your side while in a flat bed without using bedrails?: None Help needed moving from lying on your back to sitting on the side of a flat bed without using bedrails?: None Help needed moving to and from a bed to a chair (including a wheelchair)?: None Help needed standing up from a chair using your arms (e.g., wheelchair or bedside chair)?: None Help needed to walk in hospital room?: A Little Help needed climbing 3-5 steps with a railing? : A Little 6 Click Score: 22    End of Session Equipment Utilized During Treatment: Gait  belt Activity Tolerance: Patient tolerated treatment well Patient left: in chair;with call bell/phone within reach;with chair alarm set   PT Visit Diagnosis: Other abnormalities of gait and mobility (R26.89)     Time: 3539-1225 PT Time Calculation (min) (ACUTE ONLY): 18 min  Charges:  $Gait Training: 8-22 mins                     Baxter Flattery, PT  Acute Rehab Dept ([place]) [phone] Pager [phone]  04/07/2020    [facility] 04/07/2020, 11:38 AM

## 2020-04-07 NOTE — Progress Notes (Signed)
[facility]  PROGRESS NOTE  [name] Null NLG:[id number] DOB: 11/12/1970 DOA: 03/25/2020 PCP: Gildardo Pounds, NP Admit date - 03/25/2020   Admitting Physician Lavina Hamman, MD  Outpatient Primary MD for the patient is Gildardo Pounds, NP  LOS - 13 Brief Narrative   [name] is a 50 y.o. year old male with medical history significant for Type 2 diabetes, HTN, CKD stage III, chronic right foot wound presenting with worsening right foot pain for the past week and after palpation evaluation by his podiatrist was directed to the ED due to concern for right foot ulcer with osteomyelitis and gas gangrene based off wound evaluation) necrotic, red and, purulent drainage) and x-ray (outpatient) presents with osteomyelitis of fifth metatarsal with multifocal subcutaneous emphysema. He underwent emergent operative debridement on 12/30 with transmetatarsal amputation. During hospital course he is remained on vancomycin, ceftriaxone, and Flagyl for Peptostreptococcus bacteremia (1 of 2 blood cultures 03/25/2020) and Staphylococcus lugdunensis surgical wound culture.   Subjective  No acute events overnight.  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 Remains afebrile, white count slowly downtrending, repeat blood cultures unremarkable. Suspect polymicrobial, surgical culture positive for Staphylococcus lugdunensis -Doxycycline for staphylococcal bacteremia, discontinue vancomycin on 1/5 -Continue p.o. Flagyl for Peptostreptococcus, discontinued ceftriaxone on 1/5 -Plan to continue oral antibiotics for 2 weeks for soft tissue infection right foot -PT recommends SNF continue monitor CBC for downtrend depending resuming wound VAC, and  will need home care for [facility].  PT recs HHPT--TOC was able to arrange home health therapy with home health RN, and PT --TOC assisting with obtaining insurance Auth for initiating home wound VAC but needs signature from his podiatrist will likely have to wait till Monday   Peptostreptococcus bacteremia. Repeat blood cultures on 1/30 remain negative. TTE with no vegetations -Stable antibiotics  Leukocytosis, slowly downtrending.  Stable at 19.  Remains afebrile.  No localizing signs or symptoms of infection. - Continue closely monitor CBC  Type 2 diabetes with peripheral neuropathy and hyperlipidemia. A1c 7.9. Glucose remains well controlled here -Continue Lantus 5 units, sliding scale insulin as needed -Continue gabapentin -continue statin  Pericardial effusion. Incidentally noted on TTE from 1/3. Trivial in size per report circumferential with no evidence of cardiac tamponade. Symptomatically has no dyspnea, no chest pain, no tachycardia -Closely monitor clinically  Chronic normocytic anemia, stable. Has history of iron deficiency anemia, takes iron at home. Slightly worse from baseline, likely related to postoperative changes. Previous baseline 9-10, currently 7.9-8.4. No episodes of bleeding -monitor CBC  CKD stage III, stable. Creatinine stable at baseline -Avoid nephrotoxins, monitor output  Hypertension at goal -Continue amlodipine, Coreg, clonidine -Home lisinopril held on admission  Mood disorder, stable-continue Lexapro, Wellbutrin  HLD, stable-continue Lipitor, aspirin    Family Communication  : None  Code Status : Full  Disposition Plan  :  Patient is from home. Anticipated d/c date: Medically stable for discharge. Barriers to d/c or necessity for inpatient status:  Medically stable for discharge, but needs safe disposition appreciate case management assistance to ensure wound VAC when discharged home via insurance auth needing signature from his podiatrist, hopeful for  discharge in 24 hours  Consults  : Podiatry  Procedures  : Transmetatarsal amputation 12/30  DVT Prophylaxis  :  Heparin  MDM: The below  labs and imaging reports were reviewed and summarized above.  Medication management as above.  Lab Results  Component Value Date   PLT 472 (H) 04/04/2020    Diet :  Diet Order            Diet heart healthy/carb modified Room service appropriate? Yes; Fluid consistency: Thin  Diet effective now                  Inpatient Medications Scheduled Meds: . amLODipine  10 mg Oral Daily  . aspirin EC  81 mg Oral Daily  . atorvastatin  20 mg Oral Daily  . B-complex with vitamin C  1 tablet Oral Daily  . buPROPion  150 mg Oral Daily  . carvedilol  25 mg Oral BID  . Chlorhexidine Gluconate Cloth  6 each Topical UD  . cloNIDine  0.1 mg Oral BID  . doxycycline  100 mg Oral Q12H  . escitalopram  10 mg Oral QHS  . ferrous sulfate  325 mg Oral Daily  . gabapentin  300 mg Oral TID  . heparin  5,000 Units Subcutaneous Q8H  . insulin aspart  0-6 Units Subcutaneous TID WC  . insulin glargine  2 Units Subcutaneous Daily  . metroNIDAZOLE  500 mg Oral Q8H  . multivitamin with minerals  1 tablet Oral Q24H  . nicotine  7 mg Transdermal Daily  . Ensure Max Protein  11 oz Oral QHS   Continuous Infusions: . methocarbamol (ROBAXIN) IV     PRN Meds:.acetaminophen **OR** acetaminophen, hydrALAZINE, HYDROmorphone (DILAUDID) injection, melatonin, methocarbamol (ROBAXIN) IV, ondansetron **OR** ondansetron (ZOFRAN) IV, oxyCODONE, prochlorperazine  Antibiotics  :   Anti-infectives (From admission, onward)   Start     Dose/Rate Route Frequency Ordered Stop   04/02/20 2200  doxycycline (VIBRA-TABS) tablet 100 mg        100 mg Oral Every 12 hours 04/02/20 1405     03/31/20 1000  vancomycin (VANCOCIN) IVPB 1000 mg/200 mL premix  Status:  Discontinued        1,000 mg 200 mL/hr over 60 Minutes Intravenous Every 36 hours 03/31/20 0914 04/02/20 1405   03/29/20 1800   vancomycin (VANCOCIN) IVPB 1000 mg/200 mL premix  Status:  Discontinued        1,000 mg 200 mL/hr over 60 Minutes Intravenous Every 48 hours 03/28/20 1012 03/31/20 0914   03/27/20 1500  vancomycin (VANCOREADY) IVPB 1250 mg/250 mL  Status:  Discontinued        1,250 mg 166.7 mL/hr over 90 Minutes Intravenous Every 48 hours 03/25/20 2020 03/28/20 1012   03/27/20 0815  vancomycin (VANCOCIN) powder  Status:  Discontinued          As needed 03/27/20 0815 03/27/20 1103   03/27/20 0600  clindamycin (CLEOCIN) IVPB 900 mg        900 mg 100 mL/hr over 30 Minutes Intravenous On call to O.R. 03/26/20 1633 03/27/20 0745   03/25/20 2200  metroNIDAZOLE (FLAGYL) tablet 500 mg        500 mg Oral Every 8 hours 03/25/20 1841     03/25/20 1845  cefTRIAXone (ROCEPHIN) 2 g in sodium chloride 0.9 % 100 mL IVPB  Status:  Discontinued        2 g 200 mL/hr over 30 Minutes Intravenous Every 24 hours 03/25/20 1841 04/02/20 1508   03/25/20 1500  vancomycin (VANCOREADY) IVPB 1500 mg/300 mL        1,500 mg 150 mL/hr over 120 Minutes Intravenous  Once 03/25/20  1426 03/25/20 1744   03/25/20 1430  ceFEPIme (MAXIPIME) 2 g in sodium chloride 0.9 % 100 mL IVPB        2 g 200 mL/hr over 30 Minutes Intravenous  Once 03/25/20 1425 03/25/20 1510   03/25/20 1430  metroNIDAZOLE (FLAGYL) tablet 500 mg        500 mg Oral  Once 03/25/20 1425 03/25/20 1442       Objective   Vitals:   04/06/20 2145 04/07/20 0509 04/07/20 1243 04/07/20 2053  BP: (!) 148/81 (!) 153/82 103/73 (!) 150/81  Pulse: 81 84 71 78  Resp: 18 17 16 14   Temp: 98.1 F (36.7 C) 98.4 F (36.9 C) 98 F (36.7 C) 98.4 F (36.9 C)  TempSrc: Oral Oral Oral Oral  SpO2: 100% 99% 100% 99%  Weight:      Height:        SpO2: 99 % O2 Flow Rate (L/min): 2 L/min  Wt Readings from Last 3 Encounters:  03/25/20 90.7 kg  03/18/20 91.2 kg  03/06/20 91.4 kg     Intake/Output Summary (Last 24 hours) at 04/07/2020 2219 Last data filed at 04/07/2020 2100 Gross  per 24 hour  Intake 120 ml  Output 1100 ml  Net -980 ml    Physical Exam:  Awake Alert, Oriented X 3, Normal affect No new F.N deficits,  [place].AT, Normal respiratory effort on room air, Right foot status post transmetatarsal imitation with dressing in place that is clean, dry, intact.  And wound VAC in place   I have personally reviewed the following:   Data Reviewed:  CBC Recent Labs  Lab 04/01/20 0412 04/02/20 0605 04/03/20 0537 04/04/20 0523  WBC 21.1* 19.3* 19.2* 19.4*  HGB 8.2* 8.4* 8.5* 7.9*  HCT 26.9* 26.6* 27.2* 25.3*  PLT 453* 498* 484* 472*  MCV 90.3 87.5 89.5 86.6  MCH 27.5 27.6 28.0 27.1  MCHC 30.5 31.6 31.3 31.2  RDW 15.3 15.8* 16.0* 16.1*  LYMPHSABS 2.8 2.9  --   --   MONOABS 0.9 1.1*  --   --   EOSABS 0.5 0.5  --   --   BASOSABS 0.0 0.1  --   --     Chemistries  Recent Labs  Lab 04/01/20 0412 04/02/20 0605 04/03/20 0537 04/04/20 0835  NA 138 137 138 138  K 3.5 3.8 3.9 3.9  CL 112* 111 111 110  CO2 18* 19* 17* 22  GLUCOSE 140* 139* 90 100*  BUN 23* 20 21* 20  CREATININE 2.15* 2.28* 2.48* 2.25*  CALCIUM 8.0* 7.9* 8.2* 8.3*  MG 1.9 1.7  --   --   AST  --  14*  --   --   ALT  --  14  --   --   ALKPHOS  --  62  --   --   BILITOT  --  0.4  --   --    ------------------------------------------------------------------------------------------------------------------ No results for input(s): CHOL, HDL, LDLCALC, TRIG, CHOLHDL, LDLDIRECT in the last 72 hours.  Lab Results  Component Value Date   HGBA1C 7.9 (H) 03/25/2020   ------------------------------------------------------------------------------------------------------------------ No results for input(s): TSH, T4TOTAL, T3FREE, THYROIDAB in the last 72 hours.  Invalid input(s): FREET3 ------------------------------------------------------------------------------------------------------------------ No results for input(s): VITAMINB12, FOLATE, FERRITIN, TIBC, IRON, RETICCTPCT in the last 72  hours.  Coagulation profile No results for input(s): INR, PROTIME in the last 168 hours.  No results for input(s): DDIMER in the last 72 hours.  Cardiac Enzymes No results for input(s): CKMB,  TROPONINI, MYOGLOBIN in the last 168 hours.  Invalid input(s): CK ------------------------------------------------------------------------------------------------------------------ No results found for: BNP  Micro Results Recent Results (from the past 240 hour(s))  Culture, blood (routine x 2)     Status: None   Collection Time: 03/31/20  4:54 AM   Specimen: BLOOD  Result Value Ref Range Status   Specimen Description   Final    BLOOD FOREARM Performed at [facility] [address] [place], [place] [address]    Special Requests   Final    BOTTLES DRAWN AEROBIC ONLY Blood Culture adequate volume Performed at [facility] [address] [place], [place] [address]    Culture   Final    NO GROWTH 5 DAYS Performed at [facility], [place] [address], [place], [place] [address]    Report Status 04/05/2020 FINAL  Final  Culture, blood (routine x 2)     Status: None   Collection Time: 03/31/20  4:54 AM   Specimen: BLOOD LEFT HAND  Result Value Ref Range Status   Specimen Description   Final    BLOOD LEFT HAND Performed at [facility] [address] [place], [place] [address]    Special Requests   Final    BOTTLES DRAWN AEROBIC ONLY Blood Culture adequate volume Performed at [facility] [address], [place], [place] [address]    Culture   Final    NO GROWTH 5 DAYS Performed at [facility], [place] [address], [place], [place] [address]    Report Status 04/05/2020 FINAL  Final    Radiology Reports DG Abd 2 Views  Result Date: 03/28/2020 CLINICAL DATA:  RIGHT foot wound. EXAM: ABDOMEN - 2 VIEW COMPARISON:  None. FINDINGS: Scattered nonspecific air-fluid levels within the small and large  bowel. No dilated large or small bowel loops. No evidence of free intraperitoneal air is seen. No acute appearing osseous abnormality. IMPRESSION: 1. Scattered nonspecific air-fluid levels throughout the abdomen, involving both large and small bowel, suggesting gastroenteritis or enterocolitis of infectious or inflammatory nature. 2. No evidence of bowel obstruction. Electronically Signed   By: Franki Cabot M.D.   On: 03/28/2020 15:40   DG Foot 2 Views Right  Result Date: 03/27/2020 CLINICAL DATA:  Postop transmetatarsal amputation EXAM: RIGHT FOOT - 2 VIEW COMPARISON:  None. FINDINGS: Interval transmetatarsal amputation. No bone destruction. No periosteal reaction. No fracture or dislocation. Postsurgical changes in the overlying soft tissues. Small plantar calcaneal spur. Lateral wound VAC noted. IMPRESSION: Interval transmetatarsal amputation. Electronically Signed   By: Kathreen Devoid   On: 03/27/2020 10:57   DG Foot 2 Views Right  Result Date: 03/25/2020 CLINICAL DATA:  Transmetatarsal amputation EXAM: RIGHT FOOT - 2 VIEW COMPARISON:  03/19/2020 FINDINGS: Previous amputation at the level of the MTP joint involving the first and third digits. Chronic dorsal displacement of the base of the second proximal phalanx with respect to the head of the metatarsal. Interval amputation of fifth digit at the level of the proximal shaft of the metatarsal. Vascular calcifications. IMPRESSION: 1. Interval amputation of fifth digit at the level of the proximal shaft of the metatarsal. 2. Previous amputation of the first and third digits. Chronic dislocation at the second MTP joint. Electronically Signed   By: Donavan Foil M.D.   On: 03/25/2020 21:26   DG Foot Complete Right  Result Date: 03/25/2020 Please see detailed radiograph report in office note.  DG Foot Complete Right  Result  Date: 03/19/2020 Please see detailed radiograph report in office note.  VAS [place] ABI WITH/WO TBI  Result Date:  03/26/2020 LOWER EXTREMITY DOPPLER STUDY Indications: Gangrene, and peripheral artery disease.  Vascular Interventions: Pre-Op TMA 12/30 s/p toe amputations. Performing Technologist: Vonzell Schlatter RVT  Examination Guidelines: A complete evaluation includes at minimum, Doppler waveform signals and systolic blood pressure reading at the level of bilateral brachial, anterior tibial, and posterior tibial arteries, when vessel segments are accessible. Bilateral testing is considered an integral part of a complete examination. Photoelectric Plethysmograph (PPG) waveforms and toe systolic pressure readings are included as required and additional duplex testing as needed. Limited examinations for reoccurring indications may be performed as noted.  ABI Findings: +---------+------------------+-----+---------+---------------------+ Right    Rt Pressure (mmHg)IndexWaveform Comment               +---------+------------------+-----+---------+---------------------+ Brachial 125                    triphasic                      +---------+------------------+-----+---------+---------------------+ PTA      154               1.23 triphasic                      +---------+------------------+-----+---------+---------------------+ DP       164               1.31 triphasic                      +---------+------------------+-----+---------+---------------------+ Great Toe                                recent toes amputated +---------+------------------+-----+---------+---------------------+ +--------+------------------+-----+---------+---------------+ Left    Lt Pressure (mmHg)IndexWaveform Comment         +--------+------------------+-----+---------+---------------+ Brachial                       triphasicIV and bandages +--------+------------------+-----+---------+---------------+ PTA     121               0.97 triphasic                 +--------+------------------+-----+---------+---------------+ DP      140               1.12 triphasic                +--------+------------------+-----+---------+---------------+  Summary: Right: Resting right ankle-brachial index indicates noncompressible right lower extremity arteries. Left: Resting left ankle-brachial index is within normal range. No evidence of significant left lower extremity arterial disease.  *See table(s) above for measurements and observations.  Electronically signed by Jamelle Haring on 03/26/2020 at 5:30:30 PM.   Final    MYOCARDIAL PERFUSION IMAGING  Result Date: 03/18/2020  Nuclear stress EF: 55%.  There was no ST segment deviation noted during stress.  The study is normal.  The left ventricular ejection fraction is normal (55-65%).  This is a low risk study.  Gwyndolyn Kaufman, MD   ECHOCARDIOGRAM COMPLETE  Result Date: 03/31/2020    ECHOCARDIOGRAM REPORT   Patient Name:   [name] Date of Exam: 03/31/2020 Medical Rec #:  [id number]        Height:       64.0 in Accession #:    [id number]  Weight:       200.0 lb Date of Birth:  Jun 10, 1970        BSA:          1.956 m Patient Age:    38 years         BP:           141/76 mmHg Patient Gender: M                HR:           77 bpm. Exam Location:  Inpatient Procedure: 2D Echo, Cardiac Doppler and Color Doppler Indications:    Bacteremia  History:        Patient has prior history of Echocardiogram examinations, most                 recent 01/22/2020. Signs/Symptoms:Bacteremia; Risk                 Factors:Diabetes, Current Smoker and Hypertension.  Sonographer:    Roseanna Rainbow RDCS Referring Phys: [id number] [facility]  Sonographer Comments: Technically difficult study due to poor echo windows. IMPRESSIONS  1. Left ventricular ejection fraction, by estimation, is 60 to 65%. Left ventricular ejection fraction by 2D MOD biplane is 64.1 %. Left ventricular ejection fraction by PLAX is 65 %. The left ventricle has  normal function. The left ventricle has no regional wall motion abnormalities. There is mild concentric left ventricular hypertrophy. Left ventricular diastolic parameters are consistent with Grade I diastolic dysfunction (impaired relaxation).  2. Right ventricular systolic function is normal. The right ventricular size is normal.  3. The pericardial effusion is circumferential and posterior to the left ventricle. There is no evidence of cardiac tamponade.  4. The mitral valve is normal in structure. No evidence of mitral valve regurgitation. No evidence of mitral stenosis.  5. The aortic valve was not well visualized. Aortic valve regurgitation is not visualized. No aortic stenosis is present.  6. The inferior vena cava is normal in size with greater than 50% respiratory variability, suggesting right atrial pressure of 3 mmHg. FINDINGS  Left Ventricle: Left ventricular ejection fraction, by estimation, is 60 to 65%. Left ventricular ejection fraction by PLAX is 65 %. Left ventricular ejection fraction by 2D MOD biplane is 64.1 %. The left ventricle has normal function. The left ventricle has no regional wall motion abnormalities. The left ventricular internal cavity size was normal in size. There is mild concentric left ventricular hypertrophy. Left ventricular diastolic parameters are consistent with Grade I diastolic dysfunction (impaired relaxation). Indeterminate filling pressures. Right Ventricle: The right ventricular size is normal. No increase in right ventricular wall thickness. Right ventricular systolic function is normal. Left Atrium: Left atrial size was normal in size. Right Atrium: Right atrial size was normal in size. Pericardium: Trivial pericardial effusion is present. The pericardial effusion is circumferential and posterior to the left ventricle. There is no evidence of cardiac tamponade. Mitral Valve: The mitral valve is normal in structure. No evidence of mitral valve regurgitation. No  evidence of mitral valve stenosis. Tricuspid Valve: The tricuspid valve is normal in structure. Tricuspid valve regurgitation is trivial. No evidence of tricuspid stenosis. Aortic Valve: The aortic valve was not well visualized. Aortic valve regurgitation is not visualized. No aortic stenosis is present. Pulmonic Valve: The pulmonic valve was normal in structure. Pulmonic valve regurgitation is not visualized. No evidence of pulmonic stenosis. Aorta: The aortic root is normal in size and structure. Venous: The inferior vena cava is normal  in size with greater than 50% respiratory variability, suggesting right atrial pressure of 3 mmHg. IAS/Shunts: No atrial level shunt detected by color flow Doppler.  LEFT VENTRICLE PLAX 2D                        Biplane EF (MOD) LV EF:         Left            LV Biplane EF:   Left                ventricular                      ventricular                ejection                         ejection                fraction by                      fraction by                PLAX is 65                       2D MOD                %.                               biplane is LVIDd:         3.70 cm                          64.1 %. LVIDs:         2.40 cm LV PW:         1.33 cm         Diastology LV IVS:        1.17 cm         LV e' medial:    9.46 cm/s LVOT diam:     1.90 cm         LV E/e' medial:  10.1 LV SV:         69              LV e' lateral:   8.38 cm/s LV SV Index:   35              LV E/e' lateral: 11.4 LVOT Area:     2.84 cm  LV Volumes (MOD) LV vol d, MOD    93.7 ml A2C: LV vol d, MOD    75.8 ml A4C: LV vol s, MOD    30.8 ml A2C: LV vol s, MOD    27.8 ml A4C: LV SV MOD A2C:   62.9 ml LV SV MOD A4C:   75.8 ml LV SV MOD BP:    54.9 ml RIGHT VENTRICLE             IVC RV S prime:     12.00 cm/s  IVC diam: 1.50 cm TAPSE (M-mode): 2.1 cm LEFT ATRIUM           Index       RIGHT ATRIUM  Index LA diam:      3.70 cm 1.89 cm/m  RA Area:     9.46 cm LA Vol (A2C): 40.6 ml 20.75  ml/m RA Volume:   17.70 ml 9.05 ml/m LA Vol (A4C): 26.4 ml 13.49 ml/m  AORTIC VALVE LVOT Vmax:   111.00 cm/s LVOT Vmean:  75.400 cm/s LVOT VTI:    0.243 m  AORTA Ao Root diam: 3.20 cm MITRAL VALVE MV Area (PHT): 3.53 cm    SHUNTS MV Decel Time: 215 msec    Systemic VTI:  0.24 m MV E velocity: 95.50 cm/s  Systemic Diam: 1.90 cm MV A velocity: 81.00 cm/s MV E/A ratio:  1.18 Eleonore Chiquito MD Electronically signed by Eleonore Chiquito MD Signature Date/Time: 03/31/2020/3:17:08 PM    Final      Time Spent in minutes  30     Desiree Hane M.D on 04/07/2020 at 10:19 PM  To page go to [email or web address] - password [facility]

## 2020-04-07 NOTE — TOC Progression Note (Signed)
Transition of Care [facility]) - Progression Note    Patient Details  Name: [name] MRN: [id number] Date of Birth: [birth year]  [facility] CM/SW Contact  Purcell Mouton, RN Phone Number: 04/07/2020, 12:41 PM  Clinical Narrative:     A call was made to [facility] [phone], office is closed at present time. Need MD(Podiatrist) to sign for wound Vac.  Expected Discharge Plan: [place] Barriers to Discharge: No Barriers Identified  Expected Discharge Plan and Services Expected Discharge Plan: [place] arrangements for the past 2 months: [place]: PT,RN [place]: [place] ([place]) Date [place]: 04/06/20 Time [place]: 1422 Representative spoke with at [place]: [place] ([place]) Interventions    Readmission Risk Interventions No flowsheet data found.

## 2020-04-08 DIAGNOSIS — I152 Hypertension secondary to endocrine disorders: Secondary | ICD-10-CM

## 2020-04-08 DIAGNOSIS — M86171 Other acute osteomyelitis, right ankle and foot: Secondary | ICD-10-CM | POA: Diagnosis not present

## 2020-04-08 DIAGNOSIS — E1159 Type 2 diabetes mellitus with other circulatory complications: Secondary | ICD-10-CM | POA: Diagnosis not present

## 2020-04-08 DIAGNOSIS — E1142 Type 2 diabetes mellitus with diabetic polyneuropathy: Secondary | ICD-10-CM | POA: Diagnosis not present

## 2020-04-08 DIAGNOSIS — E11621 Type 2 diabetes mellitus with foot ulcer: Secondary | ICD-10-CM | POA: Diagnosis not present

## 2020-04-08 LAB — GLUCOSE, CAPILLARY
Glucose-Capillary: 98 mg/dL (ref 70–99)
Glucose-Capillary: 72 mg/dL (ref 70–99)
Glucose-Capillary: 109 mg/dL — ABNORMAL HIGH (ref 70–99)
Glucose-Capillary: 156 mg/dL — ABNORMAL HIGH (ref 70–99)
Glucose-Capillary: 137 mg/dL — ABNORMAL HIGH (ref 70–99)

## 2020-04-08 MED ORDER — LANTUS SOLOSTAR 100 UNIT/ML ~~LOC~~ SOPN: 15.0000 [IU] | PEN_INJECTOR | Freq: Every day | SUBCUTANEOUS | 0 refills | Status: DC

## 2020-04-08 MED ORDER — NICOTINE 7 MG/24HR TD PT24: 7.0000 mg | MEDICATED_PATCH | Freq: Every day | TRANSDERMAL | 0 refills | Status: DC

## 2020-04-08 MED ORDER — DOXYCYCLINE HYCLATE 100 MG PO TABS: 100.0000 mg | ORAL_TABLET | Freq: Two times a day (BID) | ORAL | 0 refills | Status: AC

## 2020-04-08 MED ORDER — METRONIDAZOLE 500 MG PO TABS: 500.0000 mg | ORAL_TABLET | Freq: Three times a day (TID) | ORAL | 0 refills | Status: AC

## 2020-04-08 NOTE — Progress Notes (Signed)
Wound vac machine delivered to pt and Tracey from [facility] will call the pt in the room to provide instruction about the wound vac over the phone. Pt is very familiar with the wound vac as he head one on his other foot last year. Pt refused to be connected and switched  to the new wound vac. Pt said he will connect himself to the wound vac when he gets home and that the wound vac has 2 hour timeline.

## 2020-04-08 NOTE — Discharge Instructions (Signed)
You were treated for infection in the bone of your foot, osteomyelitis mutation and antibiotics.  You will continue to take doxycycline and Flagyl for additional 8 days.  You have close follow-up with your podiatrist Dr. March Rummage.  Concerning your Foley you should continue that until you are contacted by urology office for an appointment.  They will contact you on phone.  Concerning her diabetes you should continue taking your Lantus with close monitoring of your blood sugars.  We have reduced your home regimen to 15 units daily please closely monitor as you increase your fluid intake at home.  It is recommended you follow-up with your primary care doctor within 1 to 2 weeks of leaving the hospital.

## 2020-04-08 NOTE — TOC Progression Note (Signed)
Transition of Care [facility]) - Progression Note    Patient Details  Name: ALCIDES NUTTING MRN: [id number] Date of Birth: [birth year]  Transition of Care [facility]) CM/SW Contact  Purcell Mouton, RN Phone Number: 04/08/2020, 11:13 AM  Clinical Narrative:    Waiting on [employer] for wound VAC. A call to [employer] revealed that insurance has not given authorization at present time.    Expected Discharge Plan: [place] Barriers to Discharge: No Barriers Identified  Expected Discharge Plan and Services Expected Discharge Plan: [place] arrangements for the past 2 months: [place]: PT,RN [place]: [place] ([place]) Date [place]: 04/06/20 Time [place]: 1422 Representative spoke with at [place]: [place] ([place]) Interventions    Readmission Risk Interventions No flowsheet data found.

## 2020-04-09 ENCOUNTER — Telehealth: Payer: Self-pay

## 2020-04-09 LAB — GLUCOSE, CAPILLARY: Glucose-Capillary: 109 mg/dL — ABNORMAL HIGH (ref 70–99)

## 2020-04-09 NOTE — Telephone Encounter (Signed)
Transition Care Management Unsuccessful Follow-up Telephone Call  Date of discharge and from where:  04/08/2020, [facility]   Attempts:  1st Attempt  Reason for unsuccessful TCM follow-up call:  Left voice message - call placed to # [phone]. Call back requested to this CM.  Patient has appointment with Ms Raul Del, NP 04/14/2020.

## 2020-04-10 ENCOUNTER — Telehealth: Payer: Self-pay | Admitting: *Deleted

## 2020-04-10 ENCOUNTER — Telehealth: Payer: Self-pay

## 2020-04-10 NOTE — Telephone Encounter (Signed)
We can remove and do a betadine WTD and will reapply tomorrow in office. Please let her know

## 2020-04-10 NOTE — Telephone Encounter (Signed)
Please contact [name] w/ [facility] [id number] [phone]) , has more questions concerning removing the wound vac.

## 2020-04-10 NOTE — Telephone Encounter (Signed)
Pt called to inform him to bring wound vac machine/sponge/cannister. Pt states he is in possession of these items and is understanding that he must bring them to his appointment.

## 2020-04-10 NOTE — Telephone Encounter (Signed)
Transition Care Management Follow-up Telephone Call  Date of discharge and from where: 04/08/2020, [name]   How have you been since you were released from the hospital? He said he feels fine, except some right foot pain.  He explained that he is just trying to " learn to balance again."   Any questions or concerns? Yes - he just wants to know when he can have the foley catheter removed. He has contacted urology and has an appointment scheduled for 04/15/2020.  He was not sure of the name of the urology practice but he has the phone number.  He said he has been emptying the urine bag and reports that the urine is somewhat cloudy.   Items Reviewed:  Did the pt receive and understand the discharge instructions provided? Yes   Medications obtained and verified? Yes  - he said that he has all medications and did not have any questions about the med regime or need to review the med list  Other? No   Any new allergies since your discharge? No   Do you have support at home? Yes - his girlfriend  [place] and Equipment/Supplies: Were home health services ordered? yes If so, what is the name of the agency? [facility]  Has the agency set up a time to come to the patient's home? The nurse saw him this morning Were any new equipment or medical supplies ordered?  Yes: he has a wound VAC and boot for his right foot.  What is the name of the medical supply agency? [employer] - wound VAC Were you able to get the supplies/equipment?yes Do you have any questions related to the use of the equipment or supplies? No - he is familiar with the use of the VAC and has been wearing the boot.   He also has a walker, home BP monitor and glucometer. He has not checked his BP yet today. His blood sugar was 120 this morning.   Functional Questionnaire: (I = Independent and D = Dependent) ADLs:independent. Using walker when ambulating  Follow up appointments reviewed:   PCP Hospital f/u appt  confirmed? Yes  [name], NP 04/14/2020.   Specialist f/u appt confirmed? Yes  - Podiatry - 04/11/2020; urology - 04/15/2020; endocrinology- 04/21/2020.   Are transportation arrangements needed? No  - he uses [facility] transportation services  If their condition worsens, is the pt aware to call PCP or go to the Emergency Dept.? Yes  Was the patient provided with contact information for the PCP's office or ED? Yes  Was to pt encouraged to call back with questions or concerns?yes

## 2020-04-10 NOTE — Telephone Encounter (Signed)
[name] [name] is wanting to know if she should be changing patient's wound vac before his appointment on 04/11/20. Please advise.

## 2020-04-11 ENCOUNTER — Ambulatory Visit (INDEPENDENT_AMBULATORY_CARE_PROVIDER_SITE_OTHER): Payer: Medicaid Other | Admitting: Podiatry

## 2020-04-11 ENCOUNTER — Other Ambulatory Visit: Payer: Self-pay

## 2020-04-11 DIAGNOSIS — L97514 Non-pressure chronic ulcer of other part of right foot with necrosis of bone: Secondary | ICD-10-CM | POA: Diagnosis not present

## 2020-04-11 NOTE — Progress Notes (Signed)
  Subjective:  Patient ID: [name], male    DOB: 1971-02-15,  MRN: [id number]  No chief complaint on file.  DOS: 03/27/20 Procedure: TMA with Rotation flap  50 y.o. male presents with the above complaint. History confirmed with patient.  Still having issues with balance.  Otherwise no new complaints.  Objective:  Physical Exam: tenderness at the surgical site, local edema noted and calf supple, nontender. Incision: healing well, no significant drainage, no dehiscence, no significant erythema; lateral wound healing well - measuring 2x0.5 Assessment:   1. 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  Post-operative State -Overall healing well. No suture or staple removal. -VAC reapplied along lateral wound and incision. -Weight-bear as tolerated in boot  Procedure: Wound VAC Application Location: right lateral foot Wound Measurement: 2 cm x 0.5 cm x 0.5 cm  Technique: Black foam to wound base, followed by adherent dressing. Set to 125 mmHg with good seal noted. Disposition: Patient tolerated procedure well.   No follow-ups on file.

## 2020-04-13 NOTE — Telephone Encounter (Signed)
NOTED

## 2020-04-14 ENCOUNTER — Encounter: Payer: Self-pay | Admitting: Nurse Practitioner

## 2020-04-14 ENCOUNTER — Other Ambulatory Visit: Payer: Self-pay

## 2020-04-14 ENCOUNTER — Ambulatory Visit: Payer: Medicaid Other | Attending: Nurse Practitioner | Admitting: Nurse Practitioner

## 2020-04-14 ENCOUNTER — Telehealth: Payer: Self-pay | Admitting: Nurse Practitioner

## 2020-04-14 DIAGNOSIS — F419 Anxiety disorder, unspecified: Secondary | ICD-10-CM

## 2020-04-14 DIAGNOSIS — E1122 Type 2 diabetes mellitus with diabetic chronic kidney disease: Secondary | ICD-10-CM

## 2020-04-14 DIAGNOSIS — Z09 Encounter for follow-up examination after completed treatment for conditions other than malignant neoplasm: Secondary | ICD-10-CM | POA: Diagnosis not present

## 2020-04-14 DIAGNOSIS — F32A Depression, unspecified: Secondary | ICD-10-CM

## 2020-04-14 DIAGNOSIS — Z794 Long term (current) use of insulin: Secondary | ICD-10-CM

## 2020-04-14 DIAGNOSIS — N1831 Chronic kidney disease, stage 3a: Secondary | ICD-10-CM

## 2020-04-14 MED ORDER — ESCITALOPRAM OXALATE 20 MG PO TABS: 20.0000 mg | ORAL_TABLET | Freq: Every day | ORAL | 1 refills | Status: DC

## 2020-04-14 NOTE — Progress Notes (Signed)
Virtual Visit via Telephone Note Due to national recommendations of social distancing due to Post 19, telehealth visit is felt to be most appropriate for this patient at this time.  I discussed the limitations, risks, security and privacy concerns of performing an evaluation and management service by telephone and the availability of in person appointments. I also discussed with the patient that there may be a patient responsible charge related to this service. The patient expressed understanding and agreed to proceed.    I connected with [name] on 04/14/20  at  10:10 AM EST  EDT by telephone and verified that I am speaking with the correct person using two identifiers.   Consent I discussed the limitations, risks, security and privacy concerns of performing an evaluation and management service by telephone and the availability of in person appointments. I also discussed with the patient that there may be a patient responsible charge related to this service. The patient expressed understanding and agreed to proceed.   Location of Patient: Private  Residence    Location of Provider: [place] and [place] participating in Telemedicine visit: [name] FNP-BC [place]    History of Present Illness: Telemedicine visit for: HFU He has a PMH of poorly controlled DM2, HTN, CKD stage 3, amputation of the toes B/L Feet.    Admitted on 12-28 with right diabetic foot ulcer complicated by osteomyelitis, gas gangrene. Required emergent debridement with transmetatarsal amputation.  Discharged home with doxycycline, po flagyl and HH PT with wound vac CAM boot and close follow up with podiatry. Hospital course also complicated by urinary retention and he was unable to pass voiding trial. Needs to follow up with Urology as he currently has a foley catheter.   He is concerned today due to the weather he will not be able to make his urology  appointment tomorrow. I have instructed him to contact urology and see if he can be rescheduled ASAP.  He also was evaluated by a [name] Nurse last Thursday however she did not follow up with him on Saturday. I will attempt to reach out to them regarding the expected next visit and patient will reach out as well.  Depression Feeling more depressed and discouraged regarding his current state of health. Agreeable to increasing lexapro to 20mg    Depression screen [facility] 2/9 02/26/2020 01/01/2020 10/05/2019 10/17/2018 09/01/2016  Decreased Interest 2 0 3 0 1  Down, Depressed, Hopeless 0 0 3 0 1  PHQ - 2 Score 2 0 6 0 2  Altered sleeping 0 1 3 - 1  Tired, decreased energy 2 1 3  - 1  Change in appetite 2 2 3  - 3  Feeling bad or failure about yourself  0 0 0 - 1  Trouble concentrating 0 0 0 - 3  Moving slowly or fidgety/restless 0 0 0 - 3  Suicidal thoughts 0 0 0 - 0  PHQ-9 Score 6 4 15  - 14  Difficult doing work/chores - - - - Very difficult  Some recent data might be hidden   DM 2 Currently taking lantus 15 units daily. Not on humalog due to low readings related to decreased appetite.  Lab Results  Component Value Date   HGBA1C 7.9 (H) 03/25/2020    Past Medical History:  Diagnosis Date  . Allergy    seasonal allergies  . Anemia    on meds  . Diabetes mellitus type II, uncontrolled ([place])  on meds  . Glaucoma    right   . Hyperlipidemia    on meds  . Hypertension    on meds  . Other and unspecified angina pectoris   . Tobacco use     Past Surgical History:  Procedure Laterality Date  . AMPUTATION Left 08/22/2016   Procedure: GREAT TOE AMPUTATION;  Surgeon: [name], MD;  Location: [place];  Service: Orthopedics;  Laterality: Left;  . AMPUTATION Right 05/28/2017   Procedure: AMPUTATION 1st & 3rd TOE;  Surgeon: [name], MD;  Location: [place];  Service: Orthopedics;  Laterality: Right;  . EYE SURGERY     Both Eye Lasik   . I & D EXTREMITY Right 03/25/2020   Procedure:  IRRIGATION AND DEBRIDEMENT OF RIGHT FOOT. AMPUTATION OF FIFTH TOE AND PARTIAL OF FOURTH.;  Surgeon: [name], DPM;  Location: [facility];  Service: Podiatry;  Laterality: Right;  . IRRIGATION AND DEBRIDEMENT FOOT Left 06/29/2019   Procedure: IRRIGATION AND DEBRIDEMENT FOOT application wound vac;  Surgeon: [name], DPM;  Location: [facility];  Service: Podiatry;  Laterality: Left;  . LEFT HEART CATHETERIZATION WITH CORONARY ANGIOGRAM N/A 07/31/2013   Procedure: LEFT HEART CATHETERIZATION WITH CORONARY ANGIOGRAM;  Surgeon: Jettie Booze, MD;  Location: [facility] CATH LAB;  Service: Cardiovascular;  Laterality: N/A;  . spinal tap    . toe amputated    . TRANSMETATARSAL AMPUTATION Right 03/27/2020   Procedure: TRANSMETATARSAL AMPUTATION RIGHT FOOT, MEDIAL PLANTAR ARTERY FLAP, APPLICATION OF WOUND VAC ;  Surgeon: [name], DPM;  Location: [facility];  Service: Podiatry;  Laterality: Right;    Family History  Problem Relation Age of Onset  . Cancer Mother   . Lung cancer Mother 55  . Diabetes Father   . Hypertension Father   . Colon cancer Neg Hx   . Colon polyps Neg Hx   . Esophageal cancer Neg Hx   . Rectal cancer Neg Hx   . Stomach cancer Neg Hx     Social History   Socioeconomic History  . Marital status: Legally Separated    Spouse name: Not on file  . Number of children: Not on file  . Years of education: Not on file  . Highest education level: Not on file  Occupational History  . Occupation: Leisure centre manager: [employer]  Tobacco Use  . Smoking status: Current Every Day Smoker    Packs/day: 0.00    Years: 20.00    Pack years: 0.00    Types: Cigarettes  . Smokeless tobacco: Never Used  . Tobacco comment: wants patches   Vaping Use  . Vaping Use: Never used  Substance and Sexual Activity  . Alcohol use: Not Currently    Comment: occ  . Drug use: No  . Sexual activity: Not Currently  Other Topics Concern  . Not on file  Social History Narrative    Works in [employer], recently moved in early 2018 from [place]. Previous saw free clinic providers.       Living with and helping take are of his elderly father.   Social Determinants of Health   Financial Resource Strain: Not on file  Food Insecurity: Not on file  Transportation Needs: Not on file  Physical Activity: Not on file  Stress: Not on file  Social Connections: Not on file     Observations/Objective: Awake, alert and oriented x 3   Review of Systems  Constitutional:  Negative for fever, malaise/fatigue and weight loss.  HENT: Negative.  Negative for nosebleeds.   Eyes: Negative.  Negative for blurred vision, double vision and photophobia.  Respiratory: Negative.  Negative for cough and shortness of breath.   Cardiovascular: Negative.  Negative for chest pain, palpitations and leg swelling.  Gastrointestinal: Negative.  Negative for heartburn, nausea and vomiting.  Musculoskeletal: Negative for back pain and myalgias.  Neurological: Negative.  Negative for dizziness, focal weakness, seizures and headaches.  Psychiatric/Behavioral: Positive for depression. Negative for suicidal ideas. The patient is nervous/anxious.     Assessment and Plan: Donnell was seen today for hospitalization follow-up.  Diagnoses and all orders for this visit:  Hospital discharge follow-up Follow up with podiatry Will follow up with [facility] regarding missed visit  Anxiety and depression -     escitalopram (LEXAPRO) 20 MG tablet; Take 1 tablet (20 mg total) by mouth at bedtime.  Type 2 diabetes mellitus with stage 3a chronic kidney disease, with long-term current use of insulin ([name]) Continue blood sugar control as discussed in office today, low carbohydrate diet, and regular physical exercise as tolerated, 150 minutes per week (30 min each day, 5 days per week, or 50 min 3 days per week). Keep blood sugar logs with fasting goal of 90-130 mg/dl, post prandial (after you eat) less than 180.   For Hypoglycemia: BS <60 and Hyperglycemia BS >400; contact the clinic ASAP. Annual eye exams and foot exams are recommended.  He will send his blood glucose readings each week for the next 2 weeks via mychart     Follow Up Instructions Return in about 3 months (around 07/13/2020).     I discussed the assessment and treatment plan with the patient. The patient was provided an opportunity to ask questions and all were answered. The patient agreed with the plan and demonstrated an understanding of the instructions.   The patient was advised to call back or seek an in-person evaluation if the symptoms worsen or if the condition fails to improve as anticipated.  I provided 22 minutes of non-face-to-face time during this encounter including median intraservice time, reviewing previous notes, labs, imaging, medications and explaining diagnosis and management.  Gildardo Pounds, FNP-BC

## 2020-04-14 NOTE — Telephone Encounter (Signed)
Called Donita , DOB and name verified, Verbal orders provided.

## 2020-04-14 NOTE — Telephone Encounter (Signed)
Donita with [facility] calling for skilled nursing verbal orders 1 wk 1 3 wk 8  cb [phone]

## 2020-04-17 ENCOUNTER — Telehealth: Payer: Self-pay | Admitting: Nurse Practitioner

## 2020-04-17 NOTE — Telephone Encounter (Signed)
Will forward to pcp

## 2020-04-17 NOTE — Telephone Encounter (Signed)
Donita with [facility] called saying pt needs referral to a uroloigist for a foley cath that was left in from the hospital   CB#  [phone]

## 2020-04-18 ENCOUNTER — Encounter: Payer: Self-pay | Admitting: Nurse Practitioner

## 2020-04-21 ENCOUNTER — Ambulatory Visit: Payer: Medicaid Other | Admitting: Internal Medicine

## 2020-04-21 ENCOUNTER — Ambulatory Visit: Payer: Self-pay | Admitting: *Deleted

## 2020-04-21 ENCOUNTER — Telehealth: Payer: Self-pay

## 2020-04-21 NOTE — Telephone Encounter (Addendum)
Patient has been in hospital recently and is complaining dark stool that started in hospital and no taste that also started in hospital. Patient is currently on iron supplement and treatment from hospital/surgery. Patient is not constipated and he does not report any rectal bleeding. Call to office for hospital follow up appointment-no answer. Message sent over for scheduling- patient is aware if he sees blood in stool- he needs to call back- patient has been anemic for some time- not sure if hemoccult study has been done.   Reason for Disposition . Unusual stool color probably from food or medicine  Answer Assessment - Initial Assessment Questions 1. COLOR: "What color is it?" "Is that color in part or all of the stool?"     Black- all 2. ONSET: "When was the unusual color first noted?"     Started in the hospital 3. CAUSE: "Have you eaten any food or taken any medicine of this color?" (See listing in BACKGROUND)     Patient is using iron 4. OTHER SYMPTOMS: "Do you have any other symptoms?" (e.g., diarrhea, jaundice, abdominal pain, fever).     No  Protocols used: STOOLS - UNUSUAL COLOR-A-AH

## 2020-04-21 NOTE — Telephone Encounter (Signed)
Nurse from [facility] called today about pt foot wasn't looking good when she went to apply the wound vac on Saturday. Nurse states that the skin is intact but yellowish drainage. Pt is schedule tomorrow to see Dr. March Rummage.

## 2020-04-22 ENCOUNTER — Ambulatory Visit (INDEPENDENT_AMBULATORY_CARE_PROVIDER_SITE_OTHER): Payer: Medicaid Other | Admitting: Podiatry

## 2020-04-22 ENCOUNTER — Other Ambulatory Visit: Payer: Self-pay | Admitting: Podiatry

## 2020-04-22 ENCOUNTER — Ambulatory Visit: Payer: Medicaid Other | Attending: Nurse Practitioner

## 2020-04-22 ENCOUNTER — Other Ambulatory Visit: Payer: Self-pay

## 2020-04-22 ENCOUNTER — Ambulatory Visit (INDEPENDENT_AMBULATORY_CARE_PROVIDER_SITE_OTHER): Payer: Medicaid Other

## 2020-04-22 ENCOUNTER — Other Ambulatory Visit: Payer: Self-pay | Admitting: Nurse Practitioner

## 2020-04-22 ENCOUNTER — Other Ambulatory Visit: Payer: Medicaid Other

## 2020-04-22 DIAGNOSIS — E113593 Type 2 diabetes mellitus with proliferative diabetic retinopathy without macular edema, bilateral: Secondary | ICD-10-CM

## 2020-04-22 DIAGNOSIS — L97514 Non-pressure chronic ulcer of other part of right foot with necrosis of bone: Secondary | ICD-10-CM | POA: Diagnosis not present

## 2020-04-22 DIAGNOSIS — Z9889 Other specified postprocedural states: Secondary | ICD-10-CM

## 2020-04-22 DIAGNOSIS — D72829 Elevated white blood cell count, unspecified: Secondary | ICD-10-CM

## 2020-04-22 DIAGNOSIS — E1159 Type 2 diabetes mellitus with other circulatory complications: Secondary | ICD-10-CM

## 2020-04-22 DIAGNOSIS — I152 Hypertension secondary to endocrine disorders: Secondary | ICD-10-CM

## 2020-04-22 NOTE — Telephone Encounter (Signed)
He had colonoscopy last year. We are waiting for his lab work to be done to check his CBC as well.

## 2020-04-22 NOTE — Progress Notes (Signed)
  Subjective:  Patient ID: MANOLITO JUREWICZ, male    DOB: 11-May-1970,  MRN: [id number]  Chief Complaint  Patient presents with  . Post-op Problem    -per nurse stitche cam loose and foot looked infected -per pt foot looks and feels fine, with occasional throbbing; 5/10 -pt denies N/V/F/C/h Tx: vac change    DOS: 03/27/20 Procedure: TMA with Rotation flap  50 y.o. male presents for wound care. Hx confirmed with patient.  Objective:  Physical Exam: Wound Location: right lateral foot Wound Measurement: 3x1x0.5 Wound Base: Mixed Granular/Fibrotic Peri-wound: Normal Exudate: Scant/small amount Serosanguinous exudate wound without warmth, erythema, signs of acute infection  Some marginal necrosis along the incision noted.   Assessment:   1. 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 Post-operative state    Plan:  Patient was evaluated and treated and all questions answered.  Ulcer right foot, s/p TMA -No signs of acute infection noted today. -Dressing applied consisting of prisma, sterile gauze, kerlix and ACE bandage -Offload ulcer with CAM boot -Resume VAC tomorrow  No follow-ups on file.

## 2020-04-22 NOTE — Telephone Encounter (Signed)
Iron can cause dark stools. Please let us know if you notice any blood in your stools

## 2020-04-23 ENCOUNTER — Telehealth: Payer: Self-pay | Admitting: *Deleted

## 2020-04-23 NOTE — Telephone Encounter (Signed)
[name] w/ [facility] is concerned with the masceration on the bottom of patient's foot were the toes would have ended has healed 90% since Monday. Could she get a verbal order to discontinue wound vac or use it on the open outer side of the foot,where the toes were amputated, so much moisture there.Please advise.

## 2020-04-23 NOTE — Telephone Encounter (Signed)
Called [facility],(  2nd attempt ) per message from physician, no answer.

## 2020-04-23 NOTE — Telephone Encounter (Signed)
Yes please provide verbal order only for wound VAC to be used on the lateral side of the foot.

## 2020-04-23 NOTE — Telephone Encounter (Signed)
Called [facility], no answer, left V message to return call back concerning patient and doctor's order.

## 2020-04-24 NOTE — Telephone Encounter (Signed)
Called and spoke with Judson Roch w/ [facility],giving message per [name], verbalized understanding and said that she would also continue to cover the foot (toe  Amputations) with gauges until next visit.

## 2020-04-25 ENCOUNTER — Telehealth: Payer: Self-pay | Admitting: *Deleted

## 2020-04-25 ENCOUNTER — Encounter: Payer: Medicaid Other | Admitting: Podiatry

## 2020-04-25 NOTE — Telephone Encounter (Signed)
completed

## 2020-04-29 ENCOUNTER — Other Ambulatory Visit: Payer: Self-pay

## 2020-04-29 ENCOUNTER — Ambulatory Visit (INDEPENDENT_AMBULATORY_CARE_PROVIDER_SITE_OTHER): Payer: Medicaid Other | Admitting: Podiatry

## 2020-04-29 DIAGNOSIS — Z9889 Other specified postprocedural states: Secondary | ICD-10-CM

## 2020-04-29 DIAGNOSIS — L97514 Non-pressure chronic ulcer of other part of right foot with necrosis of bone: Secondary | ICD-10-CM

## 2020-05-01 NOTE — Telephone Encounter (Signed)
Spoke to patient to inform on PCP advising and scheduled a lab appt.

## 2020-05-02 ENCOUNTER — Ambulatory Visit: Payer: Medicaid Other | Attending: Nurse Practitioner

## 2020-05-02 ENCOUNTER — Other Ambulatory Visit: Payer: Self-pay

## 2020-05-02 DIAGNOSIS — D72829 Elevated white blood cell count, unspecified: Secondary | ICD-10-CM

## 2020-05-02 DIAGNOSIS — I152 Hypertension secondary to endocrine disorders: Secondary | ICD-10-CM

## 2020-05-02 DIAGNOSIS — E1159 Type 2 diabetes mellitus with other circulatory complications: Secondary | ICD-10-CM

## 2020-05-02 NOTE — Telephone Encounter (Signed)
Copied from [place] [id number]. Topic: Quick Communication - Home Health Verbal Orders >> Apr 30, 2020 10:41 AM C[place], Jaci Carrel wrote: Caller/Agency: [facility] Callback Number: [phone] Requesting /PT Frequency: 1 week 1, 2 week 5

## 2020-05-03 LAB — CBC WITH DIFFERENTIAL/PLATELET
Basophils Absolute: 0.1 10*3/uL (ref 0.0–0.2)
Basos: 0 %
EOS (ABSOLUTE): 0.4 10*3/uL (ref 0.0–0.4)
Eos: 3 %
Hematocrit: 32.7 % — ABNORMAL LOW (ref 37.5–51.0)
Hemoglobin: 10.5 g/dL — ABNORMAL LOW (ref 13.0–17.7)
Immature Grans (Abs): 0.1 10*3/uL (ref 0.0–0.1)
Immature Granulocytes: 1 %
Lymphocytes Absolute: 2.9 10*3/uL (ref 0.7–3.1)
Lymphs: 20 %
MCH: 27.9 pg (ref 26.6–33.0)
MCHC: 32.1 g/dL (ref 31.5–35.7)
MCV: 87 fL (ref 79–97)
Monocytes Absolute: 0.9 10*3/uL (ref 0.1–0.9)
Monocytes: 6 %
Neutrophils Absolute: 10.5 10*3/uL — ABNORMAL HIGH (ref 1.4–7.0)
Neutrophils: 70 %
Platelets: 344 10*3/uL (ref 150–450)
RBC: 3.77 x10E6/uL — ABNORMAL LOW (ref 4.14–5.80)
RDW: 14.9 % (ref 11.6–15.4)
WBC: 14.8 10*3/uL — ABNORMAL HIGH (ref 3.4–10.8)

## 2020-05-03 LAB — CMP14+EGFR
ALT: 26 IU/L (ref 0–44)
AST: 16 IU/L (ref 0–40)
Albumin/Globulin Ratio: 1.1 — ABNORMAL LOW (ref 1.2–2.2)
Albumin: 3.5 g/dL — ABNORMAL LOW (ref 4.0–5.0)
Alkaline Phosphatase: 136 IU/L — ABNORMAL HIGH (ref 44–121)
BUN/Creatinine Ratio: 9 (ref 9–20)
BUN: 22 mg/dL (ref 6–24)
Bilirubin Total: <0.2 mg/dL (ref 0.0–1.2)
CO2: 20 mmol/L (ref 20–29)
Calcium: 8.9 mg/dL (ref 8.7–10.2)
Chloride: 103 mmol/L (ref 96–106)
Creatinine, Ser: 2.34 mg/dL — ABNORMAL HIGH (ref 0.76–1.27)
GFR calc Af Amer: 36 mL/min/{1.73_m2} — ABNORMAL LOW (ref >59)
GFR calc non Af Amer: 31 mL/min/{1.73_m2} — ABNORMAL LOW (ref >59)
Globulin, Total: 3.3 g/dL (ref 1.5–4.5)
Glucose: 258 mg/dL — ABNORMAL HIGH (ref 65–99)
Potassium: 4.6 mmol/L (ref 3.5–5.2)
Sodium: 138 mmol/L (ref 134–144)
Total Protein: 6.8 g/dL (ref 6.0–8.5)

## 2020-05-06 NOTE — Telephone Encounter (Signed)
Attempt to reach [facility] [place] for verbal approvals. No answer and LVM.

## 2020-05-07 ENCOUNTER — Telehealth: Payer: Self-pay | Admitting: *Deleted

## 2020-05-07 ENCOUNTER — Telehealth: Payer: Self-pay | Admitting: Podiatry

## 2020-05-07 NOTE — Telephone Encounter (Signed)
Patient's home health nurse is calling with concerns about patients foot. She is requesting a call back as soon as possible as she is at his house with him.

## 2020-05-07 NOTE — Telephone Encounter (Signed)
Sarah w/ [facility] is calling with concerns about the top of patient's foot,odor,large amount of drainage and masceration around edges. Called and spoke with Judson Roch, and she said that she has spoken with [name] already.

## 2020-05-07 NOTE — Telephone Encounter (Signed)
Called back, ordered betadine WTD. Go to ED should the wound worsen

## 2020-05-09 ENCOUNTER — Telehealth: Payer: Self-pay | Admitting: Podiatry

## 2020-05-09 ENCOUNTER — Other Ambulatory Visit: Payer: Self-pay

## 2020-05-09 ENCOUNTER — Ambulatory Visit (INDEPENDENT_AMBULATORY_CARE_PROVIDER_SITE_OTHER): Payer: Medicaid Other | Admitting: Podiatry

## 2020-05-09 ENCOUNTER — Telehealth: Payer: Self-pay | Admitting: Nurse Practitioner

## 2020-05-09 ENCOUNTER — Ambulatory Visit: Payer: Self-pay | Admitting: Podiatry

## 2020-05-09 DIAGNOSIS — L97514 Non-pressure chronic ulcer of other part of right foot with necrosis of bone: Secondary | ICD-10-CM

## 2020-05-09 DIAGNOSIS — Z9889 Other specified postprocedural states: Secondary | ICD-10-CM

## 2020-05-09 NOTE — Telephone Encounter (Signed)
Copied from [place] [id number]. Topic: Appointment Scheduling - Scheduling Inquiry for Clinic >> May 09, 2020  1:32 PM Lenon Curt, Rana Snare A wrote: Patient has recently had a procedure (amputation of toes on right foot) and has wounds in need of cleaning  Patient has an appointment at [facility] for a wound cleaning on 05/16/20  A physical is required for patient to be seen at the wound center  Agent was unable to schedule a satisfactory appointment at the time of call  Patient's home health aide is requesting to be contacted for further advising

## 2020-05-09 NOTE — Telephone Encounter (Signed)
They need new skilled nurse frequencies verbal orders since it was changed the wound care iodine soak from wet to dry. [phone]

## 2020-05-12 NOTE — Telephone Encounter (Signed)
Judson Roch was following up to see if it was possible to get the PT an appt prior to Friday the 18th. Pre-surgery physical is required and there were no appts open, please advise if PT can be fit in before this time. Judson Roch also wanted to update the verbal orders to 1x5. Please advise.

## 2020-05-13 ENCOUNTER — Encounter (HOSPITAL_BASED_OUTPATIENT_CLINIC_OR_DEPARTMENT_OTHER): Payer: Self-pay | Admitting: Podiatry

## 2020-05-13 ENCOUNTER — Other Ambulatory Visit: Payer: Self-pay

## 2020-05-13 ENCOUNTER — Encounter: Payer: Medicaid Other | Admitting: Podiatry

## 2020-05-13 ENCOUNTER — Other Ambulatory Visit (HOSPITAL_COMMUNITY)
Admission: RE | Admit: 2020-05-13 | Discharge: 2020-05-13 | Disposition: A | Discharge status: Home / Self Care | Payer: Medicaid Other | Source: Ambulatory Visit | Attending: Podiatry | Admitting: Podiatry

## 2020-05-13 ENCOUNTER — Telehealth: Payer: Self-pay

## 2020-05-13 DIAGNOSIS — Z20822 Contact with and (suspected) exposure to covid-19: Secondary | ICD-10-CM | POA: Diagnosis not present

## 2020-05-13 DIAGNOSIS — Z01812 Encounter for preprocedural laboratory examination: Secondary | ICD-10-CM | POA: Diagnosis not present

## 2020-05-13 LAB — SARS CORONAVIRUS 2 (TAT 6-24 HRS): SARS Coronavirus 2: NEGATIVE

## 2020-05-13 NOTE — Telephone Encounter (Signed)
Spoke to [employer]. Patient have a appt. W/ his kidney specialist who can fill out a form for his procedure on 05/16/2020. Verbal approve for verbal orders for 1x5.

## 2020-05-13 NOTE — Progress Notes (Signed)
  Subjective:  Patient ID: [name], male    DOB: 02/12/1971,  MRN: [id number]  Chief Complaint  Patient presents with  . Routine Post Op    POV#3 Pt states nurse had some concern over loose staples/wound opening. Pt denies fever/nausea/vomiting/chills. Pt states he has finished his antibiotics and is not currently taking any.   DOS: 03/27/20 Procedure: TMA with Rotation flap  50 y.o. male presents for wound care. Hx confirmed with patient. Nurse was concerned the wound was not making progress and was looking worse. Objective:  Physical Exam: Wound Location: right lateral foot Wound Measurement: 3x1x0.5 Wound Base: Mixed Granular/Fibrotic Peri-wound: Normal Exudate: Scant/small amount Serosanguinous exudate wound without warmth, erythema, signs of acute infection  Continued marginal necrosis along the incision noted  Assessment:   1. 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 Post-operative state    Plan:  Patient was evaluated and treated and all questions answered.  Ulcer right foot, s/p TMA -Wound laterally remains slow to heal. He has marginal wound necrosis. He would benefit from debridement and graft application for promotion of healing and to prevent infection and further wound complications.   -Patient has failed all conservative therapy and wishes to proceed with surgical intervention. All risks, benefits, and alternatives discussed with patient. No guarantees given. Consent reviewed and signed by patient. -Planned procedures: Right foot debridement and irrigation with application of skin graft substitute  -ASA 3 - Patient with moderate systemic disease with functional limitations; Risk factors: DM -Post-op anticoagulation: chemoprophylaxis not indicated   No follow-ups on file.

## 2020-05-13 NOTE — Telephone Encounter (Signed)
DOS 05/16/2020  IRRIGATION & DEBRIDEMENT RT - 24401 APPLICATION SKIN GRAFT RT - 15275  RECEIVED AUTH FAX FROM [facility] - CPT 02725 DOES NOT [place], CPT [place] WAS APPROVED. AUTH # [id number] GOOD FOR 05/16/2020 ONLY

## 2020-05-13 NOTE — H&P (View-Only) (Signed)
  Subjective:  Patient ID: [name], male    DOB: November 20, 1970,  MRN: [id number]  Chief Complaint  Patient presents with  . Routine Post Op    POV#3 Pt states nurse had some concern over loose staples/wound opening. Pt denies fever/nausea/vomiting/chills. Pt states he has finished his antibiotics and is not currently taking any.   DOS: 03/27/20 Procedure: TMA with Rotation flap  50 y.o. male presents for wound care. Hx confirmed with patient. Nurse was concerned the wound was not making progress and was looking worse. Objective:  Physical Exam: Wound Location: right lateral foot Wound Measurement: 3x1x0.5 Wound Base: Mixed Granular/Fibrotic Peri-wound: Normal Exudate: Scant/small amount Serosanguinous exudate wound without warmth, erythema, signs of acute infection  Continued marginal necrosis along the incision noted  Assessment:   1. 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 Post-operative state    Plan:  Patient was evaluated and treated and all questions answered.  Ulcer right foot, s/p TMA -Wound laterally remains slow to heal. He has marginal wound necrosis. He would benefit from debridement and graft application for promotion of healing and to prevent infection and further wound complications.   -Patient has failed all conservative therapy and wishes to proceed with surgical intervention. All risks, benefits, and alternatives discussed with patient. No guarantees given. Consent reviewed and signed by patient. -Planned procedures: Right foot debridement and irrigation with application of skin graft substitute  -ASA 3 - Patient with moderate systemic disease with functional limitations; Risk factors: DM -Post-op anticoagulation: chemoprophylaxis not indicated   No follow-ups on file.

## 2020-05-13 NOTE — Addendum Note (Signed)
Addended by: Hardie Pulley on: 05/13/2020 05:13 PM   Modules accepted: Orders
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
## 2020-05-14 ENCOUNTER — Emergency Department (HOSPITAL_COMMUNITY)
Admission: EM | Admit: 2020-05-14 | Discharge: 2020-05-14 | Disposition: A | Discharge status: Home / Self Care | Payer: Medicaid Other | Attending: Emergency Medicine | Admitting: Emergency Medicine

## 2020-05-14 ENCOUNTER — Emergency Department (HOSPITAL_COMMUNITY): Payer: Medicaid Other

## 2020-05-14 ENCOUNTER — Encounter (HOSPITAL_COMMUNITY): Payer: Self-pay

## 2020-05-14 ENCOUNTER — Encounter (HOSPITAL_BASED_OUTPATIENT_CLINIC_OR_DEPARTMENT_OTHER): Payer: Self-pay | Admitting: Podiatry

## 2020-05-14 DIAGNOSIS — M542 Cervicalgia: Secondary | ICD-10-CM | POA: Insufficient documentation

## 2020-05-14 DIAGNOSIS — N1831 Chronic kidney disease, stage 3a: Secondary | ICD-10-CM | POA: Insufficient documentation

## 2020-05-14 DIAGNOSIS — Y92410 Unspecified street and highway as the place of occurrence of the external cause: Secondary | ICD-10-CM | POA: Insufficient documentation

## 2020-05-14 DIAGNOSIS — E113553 Type 2 diabetes mellitus with stable proliferative diabetic retinopathy, bilateral: Secondary | ICD-10-CM | POA: Insufficient documentation

## 2020-05-14 DIAGNOSIS — R519 Headache, unspecified: Secondary | ICD-10-CM | POA: Diagnosis not present

## 2020-05-14 DIAGNOSIS — E1142 Type 2 diabetes mellitus with diabetic polyneuropathy: Secondary | ICD-10-CM | POA: Insufficient documentation

## 2020-05-14 DIAGNOSIS — I129 Hypertensive chronic kidney disease with stage 1 through stage 4 chronic kidney disease, or unspecified chronic kidney disease: Secondary | ICD-10-CM | POA: Diagnosis not present

## 2020-05-14 DIAGNOSIS — S27321A Contusion of lung, unilateral, initial encounter: Secondary | ICD-10-CM | POA: Diagnosis not present

## 2020-05-14 DIAGNOSIS — Z79899 Other long term (current) drug therapy: Secondary | ICD-10-CM | POA: Insufficient documentation

## 2020-05-14 DIAGNOSIS — F17210 Nicotine dependence, cigarettes, uncomplicated: Secondary | ICD-10-CM | POA: Insufficient documentation

## 2020-05-14 DIAGNOSIS — Z794 Long term (current) use of insulin: Secondary | ICD-10-CM | POA: Diagnosis not present

## 2020-05-14 DIAGNOSIS — S299XXA Unspecified injury of thorax, initial encounter: Secondary | ICD-10-CM | POA: Diagnosis present

## 2020-05-14 DIAGNOSIS — Z7982 Long term (current) use of aspirin: Secondary | ICD-10-CM | POA: Diagnosis not present

## 2020-05-14 LAB — COMPREHENSIVE METABOLIC PANEL
ALT: 25 U/L (ref 0–44)
AST: 17 U/L (ref 15–41)
Albumin: 3.3 g/dL — ABNORMAL LOW (ref 3.5–5.0)
Alkaline Phosphatase: 117 U/L (ref 38–126)
Anion gap: 12 (ref 5–15)
BUN: 20 mg/dL (ref 6–20)
CO2: 20 mmol/L — ABNORMAL LOW (ref 22–32)
Calcium: 8.8 mg/dL — ABNORMAL LOW (ref 8.9–10.3)
Chloride: 105 mmol/L (ref 98–111)
Creatinine, Ser: 2.13 mg/dL — ABNORMAL HIGH (ref 0.61–1.24)
GFR, Estimated: 37 mL/min — ABNORMAL LOW (ref >60)
Glucose, Bld: 131 mg/dL — ABNORMAL HIGH (ref 70–99)
Potassium: 3.6 mmol/L (ref 3.5–5.1)
Sodium: 137 mmol/L (ref 135–145)
Total Bilirubin: 0.5 mg/dL (ref 0.3–1.2)
Total Protein: 8.2 g/dL — ABNORMAL HIGH (ref 6.5–8.1)

## 2020-05-14 LAB — CBC WITH DIFFERENTIAL/PLATELET
Abs Immature Granulocytes: 0.13 10*3/uL — ABNORMAL HIGH (ref 0.00–0.07)
Basophils Absolute: 0 10*3/uL (ref 0.0–0.1)
Basophils Relative: 0 %
Eosinophils Absolute: 0.1 10*3/uL (ref 0.0–0.5)
Eosinophils Relative: 1 %
HCT: 33 % — ABNORMAL LOW (ref 39.0–52.0)
Hemoglobin: 10.2 g/dL — ABNORMAL LOW (ref 13.0–17.0)
Immature Granulocytes: 1 %
Lymphocytes Relative: 13 %
Lymphs Abs: 2.3 10*3/uL (ref 0.7–4.0)
MCH: 27.9 pg (ref 26.0–34.0)
MCHC: 30.9 g/dL (ref 30.0–36.0)
MCV: 90.4 fL (ref 80.0–100.0)
Monocytes Absolute: 0.9 10*3/uL (ref 0.1–1.0)
Monocytes Relative: 5 %
Neutro Abs: 14.4 10*3/uL — ABNORMAL HIGH (ref 1.7–7.7)
Neutrophils Relative %: 80 %
Platelets: 337 10*3/uL (ref 150–400)
RBC: 3.65 MIL/uL — ABNORMAL LOW (ref 4.22–5.81)
RDW: 15.8 % — ABNORMAL HIGH (ref 11.5–15.5)
WBC: 17.9 10*3/uL — ABNORMAL HIGH (ref 4.0–10.5)
nRBC: 0 % (ref 0.0–0.2)

## 2020-05-14 MED ORDER — HYDROCODONE-ACETAMINOPHEN 5-325 MG PO TABS: 1.0000 | ORAL_TABLET | Freq: Four times a day (QID) | ORAL | 0 refills | Status: DC | PRN

## 2020-05-14 MED ORDER — ONDANSETRON HCL 4 MG/2ML IJ SOLN
4.0000 mg | Freq: Once | INTRAMUSCULAR | Status: AC
  Administered 2020-05-14: 4 mg via INTRAVENOUS
  Filled 2020-05-14: qty 2

## 2020-05-14 MED ORDER — HYDROMORPHONE HCL 1 MG/ML IJ SOLN
0.5000 mg | Freq: Once | INTRAMUSCULAR | Status: AC
  Administered 2020-05-14: 0.5 mg via INTRAVENOUS
  Filled 2020-05-14: qty 1
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
## 2020-05-14 NOTE — Discharge Instructions (Addendum)
Follow-up with your doctor or return to the emergency department if problems

## 2020-05-15 ENCOUNTER — Encounter (HOSPITAL_BASED_OUTPATIENT_CLINIC_OR_DEPARTMENT_OTHER): Payer: Self-pay | Admitting: Podiatry

## 2020-05-15 ENCOUNTER — Telehealth: Payer: Self-pay

## 2020-05-15 NOTE — Telephone Encounter (Signed)
Transition Care Management Follow-up Telephone Call  Date of discharge and from where: 05/14/2020 from [place]  How have you been since you were released from the hospital? Pt states that he is in a lot of pain right now. Pt has been encouraged to seek medical attention if the pain worsens. Pt has also been encouraged to schedule with pcp for further evaluation.   Any questions or concerns? No  Items Reviewed:  Did the pt receive and understand the discharge instructions provided? Yes   Medications obtained and verified? Yes   Other? No   Any new allergies since your discharge? Yes   Dietary orders reviewed? N/A  Do you have support at home? Yes   Functional Questionnaire: (I = Independent and D = Dependent) ADLs: I  Bathing/Dressing- I  Meal Prep- I  Eating- I  Maintaining continence- I  Transferring/Ambulation- I  Managing Meds- I   Follow up appointments reviewed:    [facility] f/u appt confirmed? Yes  Scheduled to see Hardie Pulley, DPM on 05/27/2020 @ 08:45AM.  Are transportation arrangements needed? No  If their condition worsens, is the pt aware to call PCP or go to the Emergency Dept.? Yes Was the patient provided with contact information for the PCP's office or ED? Yes Was to pt encouraged to call back with questions or concerns? Yes

## 2020-05-15 NOTE — Anesthesia Preprocedure Evaluation (Addendum)
Anesthesia Evaluation  Patient identified by MRN, date of birth, ID band Patient awake    Reviewed: Allergy & Precautions, NPO status , Patient's Chart, lab work & pertinent test results  Airway Mallampati: II  TM Distance: >3 FB     Dental  (+) Dental Advisory Given   Pulmonary Current Smoker and Patient abstained from smoking.,    breath sounds clear to auscultation       Cardiovascular hypertension, Pt. on medications + CAD   Rhythm:Regular Rate:Normal     Neuro/Psych  Neuromuscular disease    GI/Hepatic negative GI ROS, Neg liver ROS,   Endo/Other  diabetes, Type 2, Insulin Dependent  Renal/GU CRFRenal disease     Musculoskeletal   Abdominal   Peds  Hematology  (+) anemia ,   Anesthesia Other Findings   Reproductive/Obstetrics                            Anesthesia Physical Anesthesia Plan  ASA: III  Anesthesia Plan: MAC   Post-op Pain Management:    Induction:   PONV Risk Score and Plan: 0 and Propofol infusion, Ondansetron and Treatment may vary due to age or medical condition  Airway Management Planned: Natural Airway and Simple Face Mask  Additional Equipment: None  Intra-op Plan:   Post-operative Plan:   Informed Consent:   Plan Discussed with:   Anesthesia Plan Comments:         Anesthesia Quick Evaluation

## 2020-05-16 ENCOUNTER — Ambulatory Visit (HOSPITAL_BASED_OUTPATIENT_CLINIC_OR_DEPARTMENT_OTHER)
Admission: RE | Admit: 2020-05-16 | Discharge: 2020-05-16 | Disposition: A | Discharge status: Home / Self Care | Payer: Medicaid Other | Attending: Podiatry | Admitting: Podiatry

## 2020-05-16 ENCOUNTER — Encounter (HOSPITAL_BASED_OUTPATIENT_CLINIC_OR_DEPARTMENT_OTHER): Payer: Self-pay | Admitting: Podiatry

## 2020-05-16 ENCOUNTER — Telehealth: Payer: Self-pay | Admitting: Nurse Practitioner

## 2020-05-16 ENCOUNTER — Encounter: Payer: Self-pay | Admitting: Podiatry

## 2020-05-16 ENCOUNTER — Ambulatory Visit (HOSPITAL_BASED_OUTPATIENT_CLINIC_OR_DEPARTMENT_OTHER): Payer: Medicaid Other | Admitting: Anesthesiology

## 2020-05-16 ENCOUNTER — Encounter (HOSPITAL_BASED_OUTPATIENT_CLINIC_OR_DEPARTMENT_OTHER)
Admission: RE | Disposition: A | Discharge status: Home / Self Care | Payer: Self-pay | Source: Home / Self Care | Attending: Podiatry

## 2020-05-16 DIAGNOSIS — F17290 Nicotine dependence, other tobacco product, uncomplicated: Secondary | ICD-10-CM | POA: Diagnosis not present

## 2020-05-16 DIAGNOSIS — Y839 Surgical procedure, unspecified as the cause of abnormal reaction of the patient, or of later complication, without mention of misadventure at the time of the procedure: Secondary | ICD-10-CM | POA: Insufficient documentation

## 2020-05-16 DIAGNOSIS — Z794 Long term (current) use of insulin: Secondary | ICD-10-CM | POA: Insufficient documentation

## 2020-05-16 DIAGNOSIS — T8131XA Disruption of external operation (surgical) wound, not elsewhere classified, initial encounter: Secondary | ICD-10-CM | POA: Insufficient documentation

## 2020-05-16 DIAGNOSIS — E11621 Type 2 diabetes mellitus with foot ulcer: Secondary | ICD-10-CM | POA: Insufficient documentation

## 2020-05-16 DIAGNOSIS — L97514 Non-pressure chronic ulcer of other part of right foot with necrosis of bone: Secondary | ICD-10-CM | POA: Diagnosis not present

## 2020-05-16 HISTORY — DX: Dependence on other enabling machines and devices: Z99.89

## 2020-05-16 HISTORY — DX: Constipation, unspecified: K59.00

## 2020-05-16 HISTORY — DX: Chronic kidney disease, unspecified: N18.9

## 2020-05-16 HISTORY — PX: IRRIGATION AND DEBRIDEMENT FOOT: SHX6602

## 2020-05-16 HISTORY — DX: Presence of spectacles and contact lenses: Z97.3

## 2020-05-16 LAB — GLUCOSE, CAPILLARY
Glucose-Capillary: 136 mg/dL — ABNORMAL HIGH (ref 70–99)
Glucose-Capillary: 117 mg/dL — ABNORMAL HIGH (ref 70–99)

## 2020-05-16 SURGERY — IRRIGATION AND DEBRIDEMENT FOOT
Anesthesia: Monitor Anesthesia Care | Site: Foot | Laterality: Right

## 2020-05-16 MED ORDER — MIDAZOLAM HCL 5 MG/5ML IJ SOLN
INTRAMUSCULAR | Status: DC | PRN
  Administered 2020-05-16: 1 mg via INTRAVENOUS

## 2020-05-16 MED ORDER — BUPIVACAINE HCL (PF) 0.5 % IJ SOLN
INTRAMUSCULAR | Status: DC | PRN
  Administered 2020-05-16: 10 mL

## 2020-05-16 MED ORDER — FENTANYL CITRATE (PF) 100 MCG/2ML IJ SOLN
25.0000 ug | INTRAMUSCULAR | Status: DC | PRN
Start: 2020-05-16 — End: 2020-05-16

## 2020-05-16 MED ORDER — LIDOCAINE HCL (PF) 2 % IJ SOLN
INTRAMUSCULAR | Status: AC
  Filled 2020-05-16: qty 5

## 2020-05-16 MED ORDER — CLINDAMYCIN PHOSPHATE 900 MG/50ML IV SOLN
INTRAVENOUS | Status: AC
  Filled 2020-05-16: qty 50

## 2020-05-16 MED ORDER — FENTANYL CITRATE (PF) 100 MCG/2ML IJ SOLN
INTRAMUSCULAR | Status: DC | PRN
  Administered 2020-05-16: 50 ug via INTRAVENOUS

## 2020-05-16 MED ORDER — MIDAZOLAM HCL 2 MG/2ML IJ SOLN
INTRAMUSCULAR | Status: AC
  Filled 2020-05-16: qty 2

## 2020-05-16 MED ORDER — CLINDAMYCIN PHOSPHATE 900 MG/50ML IV SOLN
900.0000 mg | INTRAVENOUS | Status: AC
  Administered 2020-05-16: 900 mg via INTRAVENOUS

## 2020-05-16 MED ORDER — PROPOFOL 10 MG/ML IV BOLUS
INTRAVENOUS | Status: DC | PRN
  Administered 2020-05-16: 20 mg via INTRAVENOUS

## 2020-05-16 MED ORDER — SODIUM CHLORIDE 0.9 % IR SOLN
Status: DC | PRN
  Administered 2020-05-16: 200 mL

## 2020-05-16 MED ORDER — PROPOFOL 10 MG/ML IV BOLUS
INTRAVENOUS | Status: AC
  Filled 2020-05-16: qty 40

## 2020-05-16 MED ORDER — SODIUM CHLORIDE 0.9 % IV SOLN: INTRAVENOUS | Status: DC

## 2020-05-16 MED ORDER — PROPOFOL 500 MG/50ML IV EMUL
INTRAVENOUS | Status: DC | PRN
  Administered 2020-05-16: 100 ug/kg/min via INTRAVENOUS

## 2020-05-16 MED ORDER — FENTANYL CITRATE (PF) 100 MCG/2ML IJ SOLN
INTRAMUSCULAR | Status: AC
  Filled 2020-05-16: qty 2

## 2020-05-16 MED ORDER — LIDOCAINE HCL (CARDIAC) PF 100 MG/5ML IV SOSY
PREFILLED_SYRINGE | INTRAVENOUS | Status: DC | PRN
  Administered 2020-05-16: 40 mg via INTRAVENOUS

## 2020-05-16 MED ORDER — LACTATED RINGERS IV SOLN: INTRAVENOUS | Status: DC

## 2020-05-16 MED ORDER — VANCOMYCIN HCL 1 G IV SOLR
INTRAVENOUS | Status: DC | PRN
  Administered 2020-05-16: 1000 mg

## 2020-05-16 SURGICAL SUPPLY — 67 items
APL PRP STRL LF DISP 70% ISPRP (MISCELLANEOUS)
BLADE SURG 15 STRL LF DISP TIS (BLADE) ×1 IMPLANT
BLADE SURG 15 STRL SS (BLADE) ×2
BNDG CMPR 9X4 STRL LF SNTH (GAUZE/BANDAGES/DRESSINGS)
BNDG ELASTIC 3X5.8 VLCR STR LF (GAUZE/BANDAGES/DRESSINGS) ×2 IMPLANT
BNDG ELASTIC 4X5.8 VLCR STR LF (GAUZE/BANDAGES/DRESSINGS) ×2 IMPLANT
BNDG ESMARK 4X9 LF (GAUZE/BANDAGES/DRESSINGS) IMPLANT
BNDG GAUZE ELAST 4 BULKY (GAUZE/BANDAGES/DRESSINGS) ×2 IMPLANT
BRUSH SCRUB EZ PLAIN DRY (MISCELLANEOUS) ×1 IMPLANT
CHLORAPREP W/TINT 26 (MISCELLANEOUS) IMPLANT
CNTNR URN SCR LID CUP LEK RST (MISCELLANEOUS) IMPLANT
CONT SPEC 4OZ STRL OR WHT (MISCELLANEOUS)
COVER BACK TABLE 60X90IN (DRAPES) ×2 IMPLANT
COVER WAND RF STERILE (DRAPES) ×2 IMPLANT
CUFF TOURN SGL QUICK 18X4 (TOURNIQUET CUFF) IMPLANT
CUFF TOURN SGL QUICK 24 (TOURNIQUET CUFF)
CUFF TRNQT CYL 24X4X16.5-23 (TOURNIQUET CUFF) IMPLANT
DRAPE 3/4 80X56 (DRAPES) ×2 IMPLANT
DRAPE EXTREMITY T 121X128X90 (DISPOSABLE) ×2 IMPLANT
DRAPE SHEET LG 3/4 BI-LAMINATE (DRAPES) ×2 IMPLANT
DRAPE U-SHAPE 47X51 STRL (DRAPES) ×1 IMPLANT
DRSG ADAPTIC 3X8 NADH LF (GAUZE/BANDAGES/DRESSINGS) ×1 IMPLANT
DRSG PAD ABDOMINAL 8X10 ST (GAUZE/BANDAGES/DRESSINGS) ×1 IMPLANT
ELECT REM PT RETURN 9FT ADLT (ELECTROSURGICAL) ×2
ELECTRODE REM PT RTRN 9FT ADLT (ELECTROSURGICAL) ×1 IMPLANT
GAUZE SPONGE 4X4 12PLY STRL (GAUZE/BANDAGES/DRESSINGS) ×2 IMPLANT
GAUZE XEROFORM 1X8 LF (GAUZE/BANDAGES/DRESSINGS) IMPLANT
GLOVE SRG 8 PF TXTR STRL LF DI (GLOVE) ×1 IMPLANT
GLOVE SURG ENC MOIS LTX SZ7.5 (GLOVE) ×2 IMPLANT
GLOVE SURG UNDER POLY LF SZ8 (GLOVE) ×2
GOWN STRL REUS W/TWL XL LVL3 (GOWN DISPOSABLE) ×2 IMPLANT
IV NS 1000ML (IV SOLUTION) ×2
IV NS 1000ML BAXH (IV SOLUTION) IMPLANT
KIT TURNOVER CYSTO (KITS) ×2 IMPLANT
MANIFOLD NEPTUNE II (INSTRUMENTS) ×2 IMPLANT
MATRIX PURAPLYAM COM 3X4 12 SQ (Tissue) IMPLANT
NDL HYPO 25X1 1.5 SAFETY (NEEDLE) ×1 IMPLANT
NEEDLE HYPO 25X1 1.5 SAFETY (NEEDLE) ×2 IMPLANT
NS IRRIG 1000ML POUR BTL (IV SOLUTION) IMPLANT
NS IRRIG 500ML POUR BTL (IV SOLUTION) ×1 IMPLANT
PACK BASIN DAY SURGERY FS (CUSTOM PROCEDURE TRAY) ×2 IMPLANT
PADDING CAST ABS 4INX4YD NS (CAST SUPPLIES)
PADDING CAST ABS COTTON 4X4 ST (CAST SUPPLIES) ×1 IMPLANT
PENCIL SMOKE EVAC W/HOLSTER (ELECTROSURGICAL) ×1 IMPLANT
PROBE DEBRIDE SONICVAC MISONIX (TIP) ×1 IMPLANT
PURAPLYAM COM 3X4 12 SQ (Tissue) ×4 IMPLANT
SET IRRIG Y TYPE TUR BLADDER L (SET/KITS/TRAYS/PACK) IMPLANT
SPONGE LAP 4X18 RFD (DISPOSABLE) ×2 IMPLANT
STAPLER VISISTAT 35W (STAPLE) IMPLANT
STOCKINETTE 6  STRL (DRAPES) ×2
STOCKINETTE 6 STRL (DRAPES) ×1 IMPLANT
STRIP CLOSURE SKIN 1/2X4 (GAUZE/BANDAGES/DRESSINGS) ×1 IMPLANT
SUCTION FRAZIER HANDLE 10FR (MISCELLANEOUS)
SUCTION TUBE FRAZIER 10FR DISP (MISCELLANEOUS) IMPLANT
SUT ETHILON 2 0 PS N (SUTURE) ×2 IMPLANT
SUT ETHILON 3 0 PS 1 (SUTURE) IMPLANT
SUT ETHILON 4 0 PS 2 18 (SUTURE) IMPLANT
SUT MNCRL AB 3-0 PS2 18 (SUTURE) IMPLANT
SUT MNCRL AB 4-0 PS2 18 (SUTURE) IMPLANT
SUT VIC AB 2-0 SH 27 (SUTURE)
SUT VIC AB 2-0 SH 27XBRD (SUTURE) IMPLANT
SYR BULB EAR ULCER 3OZ GRN STR (SYRINGE) ×2 IMPLANT
SYR CONTROL 10ML LL (SYRINGE) ×2 IMPLANT
TRAY DSU PREP LF (CUSTOM PROCEDURE TRAY) ×2 IMPLANT
TUBE IRRIGATION SET MISONIX (TUBING) ×1 IMPLANT
UNDERPAD 30X36 HEAVY ABSORB (UNDERPADS AND DIAPERS) ×3 IMPLANT
YANKAUER SUCT BULB TIP NO VENT (SUCTIONS) ×2 IMPLANT

## 2020-05-16 NOTE — Op Note (Addendum)
Patient Name: [name] DOB: 21-Nov-1970  MRN: [id number]   Date of Service: 05/16/20   Surgeon: Dr. Hardie Pulley, DPM Assistants: None Pre-operative Diagnosis: Diabetic ulcer right foot, wound dehiscence Post-operative Diagnosis: same Procedures:             1) Wound debridement right foot - wound bed prep for grafting  2) Application of skin graft substitute Pathology/Specimens: ID Type Source Tests Collected by Time Destination  A :  Wound Wound AEROBIC/ANAEROBIC CULTURE W GRAM STAIN (SURGICAL/DEEP WOUND) [name], DPM 05/16/2020 0659    Anesthesia: MAC Hemostasis: Anatomic Estimated Blood Loss: 5 ml Materials: None Medications: 1g Vancomycin powder topical, 10 ccs 0.5% marcaine plain Complications: None  Indications for Procedure:  This is a 50 y.o. 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 He presents for care of delayed healing wound.   Procedure in Detail: Patient was identified in pre-operative holding area. Formal consent was signed and the right lower extremity was marked. Patient was brought back to the operating room and placed on the operating room table in the supine position. Anesthesia was induced.   The extremity was prepped and draped in the usual sterile fashion. Timeout was taken to confirm patient name, laterality, and procedure prior to incision. Attention was then directed to the right foot where a wound laterally measuring 1x5 was noted as well as unstageable eschar distally.  The wound was sharply excisionally debrided with a 15 blade, followed by a misonix ultrasonic debrider. Debridement was performed to bleeding, viable wound base. The wound was debrided to and including the level of the fascial tissue. The wounds were then loosely approximated with retention sutures. Following debridement and retention sutures, the wounds measured 7x1.5 and 4x0.2 distal with a small skin bridge, as well as 1x3.5 laterally. Vancomycin was applied  topically.  Puraply graft was hydrated and applied to each wound and adhered with steri strips and adaptic.  The foot was then dressed with 4x4, kerlix, ABD, and ACE bandage. Patient tolerated the procedure well.   Disposition: Following a period of post-operative monitoring, patient will be transferred back home. He will need continued outpatient debridements and possible grafts until closure is noted.

## 2020-05-16 NOTE — Interval H&P Note (Signed)
History and Physical Interval Note:  05/16/2020 7:28 AM  [name]  has presented today for surgery, with the diagnosis of Ulcer Right Foot.  The various methods of treatment have been discussed with the patient and family. After consideration of risks, benefits and other options for treatment, the patient has consented to  Procedure(s) with comments: IRRIGATION AND DEBRIDEMENT FOOT (Right) - Leave patient in bed as a surgical intervention.  The patient's history has been reviewed, patient examined, no change in status, stable for surgery.  I have reviewed the patient's chart and labs.  Questions were answered to the patient's satisfaction.     [name]

## 2020-05-16 NOTE — Anesthesia Postprocedure Evaluation (Signed)
Anesthesia Post Note  Patient: [name]  Procedure(s) Performed: IRRIGATION AND DEBRIDEMENT FOOT (Right Foot)     Patient location during evaluation: PACU Anesthesia Type: MAC Level of consciousness: awake and alert Pain management: pain level controlled Vital Signs Assessment: post-procedure vital signs reviewed and stable Respiratory status: spontaneous breathing, nonlabored ventilation, respiratory function stable and patient connected to nasal cannula oxygen Cardiovascular status: stable and blood pressure returned to baseline Postop Assessment: no apparent nausea or vomiting Anesthetic complications: no   No complications documented.  Last Vitals:  Vitals:   05/16/20 0930 05/16/20 0959  BP: (!) 195/112 (!) 186/106  Pulse: 91 95  Resp: 13 14  Temp:  37.4 C  SpO2: 99% 99%    Last Pain:  Vitals:   05/16/20 0959  TempSrc:   PainSc: 0-No pain                 Tiajuana Amass

## 2020-05-16 NOTE — Discharge Instructions (Signed)
  After Surgery Instructions   1) If you are recuperating from surgery anywhere other than home, please be sure to leave us the number where you can be reached.  2) Go directly home and rest.  3) Keep the operated foot(feet) elevated six inches above the hip when sitting or lying down. This will help control swelling and pain.  4) Support the elevated foot and leg with pillows. DO NOT PLACE PILLOWS UNDER THE KNEE.  5) DO NOT REMOVE or get your bandages WET, unless you were given different instructions by your doctor to do so. This increases the risk of infection.  6) Wear your surgical shoe or surgical boot at all times when you are up on your feet.  7) A limited amount of pain and swelling may occur. The skin may take on a bruised appearance. DO NOT BE ALARMED, THIS IS NORMAL.  8) For slight pain and swelling, apply an ice pack directly over the bandages for 15 minutes only out of each hour of the day. Continue until seen in the office for your first post op visit. DO NOT APPLY ANY FORM OF HEAT TO THE AREA.  9) Have prescriptions filled immediately and take as directed.  10) Drink lots of liquids, water and juice to stay hydrated.  11) CALL IMMEDIATELY IF:  *Bleeding continues until the following day of surgery  *Pain increases and/or does not respond to medication  *Bandages or cast appears to tight  *If your bandage gets wet  *Trip, fall or stump your surgical foot  *If your temperature goes above 101  *If you have ANY questions at all  12) You are expected to be weightbearing after your surgery.   If you need to reach the nurse for any reason, please call: [place]: [phone] [place]: [phone] [place]: [phone]     Post Anesthesia Home Care Instructions  Activity: Get plenty of rest for the remainder of the day. A responsible individual must stay with you for 24 hours following the procedure.  For the next 24 hours, DO NOT: -Drive  a car -Operate machinery -Drink alcoholic beverages -Take any medication unless instructed by your physician -Make any legal decisions or sign important papers.  Meals: Start with liquid foods such as gelatin or soup. Progress to regular foods as tolerated. Avoid greasy, spicy, heavy foods. If nausea and/or vomiting occur, drink only clear liquids until the nausea and/or vomiting subsides. Call your physician if vomiting continues.  Special Instructions/Symptoms: Your throat may feel dry or sore from the anesthesia or the breathing tube placed in your throat during surgery. If this causes discomfort, gargle with warm salt water. The discomfort should disappear within 24 hours.    

## 2020-05-16 NOTE — Transfer of Care (Signed)
Immediate Anesthesia Transfer of Care Note  Patient: [name]  Procedure(s) Performed: IRRIGATION AND DEBRIDEMENT FOOT (Right Foot)  Patient Location: PACU  Anesthesia Type:MAC  Level of Consciousness: drowsy  Airway & Oxygen Therapy: Patient Spontanous Breathing and Patient connected to nasal cannula oxygen  Post-op Assessment: Report given to RN and Post -op Vital signs reviewed and stable  Post vital signs: Reviewed and stable  Last Vitals:  Vitals Value Taken Time  BP    Temp    Pulse 87 05/16/20 0818  Resp 18 05/16/20 0818  SpO2 100 % 05/16/20 0818  Vitals shown include unvalidated device data.  Last Pain:  Vitals:   05/16/20 0614  TempSrc: Oral  PainSc: 5       Patients Stated Pain Goal: 5 (95/63/87 5643)  Complications: No complications documented.

## 2020-05-16 NOTE — Telephone Encounter (Signed)
Noted  
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
## 2020-05-19 ENCOUNTER — Telehealth: Payer: Self-pay

## 2020-05-19 ENCOUNTER — Telehealth: Payer: Self-pay | Admitting: Nurse Practitioner

## 2020-05-19 ENCOUNTER — Encounter (HOSPITAL_BASED_OUTPATIENT_CLINIC_OR_DEPARTMENT_OTHER): Payer: Self-pay | Admitting: Podiatry

## 2020-05-19 NOTE — Telephone Encounter (Signed)
Copied from [place] [id number]. Topic: General - Other >> May 19, 2020  9:18 AM Yvette Rack wrote: Reason for CRM: Sarah with [facility] called to report that pt blood pressure reading is 190/98. Judson Roch stated she woke pt up and he has not taken his medication but the reading is out of range set so she must report it. Cb# [phone]

## 2020-05-19 NOTE — Telephone Encounter (Signed)
NOTED
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
## 2020-05-19 NOTE — Telephone Encounter (Signed)
The nurse from [facility] would like to know if the patient could start PT again. Also the pt would like to return the wound vac. Please advise.

## 2020-05-19 NOTE — Telephone Encounter (Signed)
Yes he can start PT, we can also return the wound VAC

## 2020-05-19 NOTE — Telephone Encounter (Signed)
LVM with the nurse updating her on the following information.

## 2020-05-21 ENCOUNTER — Telehealth: Payer: Self-pay

## 2020-05-21 NOTE — Telephone Encounter (Signed)
[facility] called today about needing clinical notes for the patient. Please fax information over to [phone] with the reference: [id number].

## 2020-05-22 NOTE — Telephone Encounter (Signed)
Attempt to reach [name] to inform patient is willing to reschedule his PT with them. No answer and LVM.

## 2020-05-23 ENCOUNTER — Telehealth: Payer: Self-pay | Admitting: Nurse Practitioner

## 2020-05-23 NOTE — Telephone Encounter (Signed)
Copied from [place] [phone]. Topic: General - Other >> May 23, 2020  2:49 PM Oneta Rack wrote: Osvaldo Human name: Tanja Port. Relation to pt: PTA from  [facility]  Call back number: [phone]   Reason for call:  Report a missed visit, patient had stomach pain.

## 2020-05-23 NOTE — Telephone Encounter (Signed)
FYI

## 2020-05-26 NOTE — Progress Notes (Signed)
  Subjective:  Patient ID: [name], male    DOB: 1970-08-10,  MRN: [id number]  Chief Complaint  Patient presents with  . Routine Post Op    POV#2 -pt denies N/V/F/Ch -pt states he is doing alright -w/ throbbing occasional pai; 5/10 -wound vac change every M/W/F   . Diabetes    FBS: 130 A1C; unkown    DOS: 03/27/20 Procedure: TMA with Rotation flap  50 y.o. male presents for wound care. Hx confirmed with patient.  Thinks the foot is doing okay having some throbbing has been getting VAC changes Monday Wednesday Friday states that the nurse changed yesterday.  Last fasting blood sugar 130 Objective:  Physical Exam: Wound Location: right lateral foot Wound Measurement: 3x1x0.5 Wound Base: Mixed Granular/Fibrotic Peri-wound: Normal Exudate: Scant/small amount Serosanguinous exudate wound without warmth, erythema, signs of acute infection  Some marginal necrosis along the incision noted.   Assessment:   1. 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 Post-operative state    Plan:  Patient was evaluated and treated and all questions answered.  Ulcer right foot, s/p TMA -Betadine wet-to-dry applied today to be reapplied by home health care  No follow-ups on file.

## 2020-05-27 ENCOUNTER — Ambulatory Visit (INDEPENDENT_AMBULATORY_CARE_PROVIDER_SITE_OTHER): Payer: Medicaid Other | Admitting: Podiatry

## 2020-05-27 ENCOUNTER — Ambulatory Visit: Payer: Self-pay | Admitting: Podiatry

## 2020-05-27 ENCOUNTER — Other Ambulatory Visit: Payer: Self-pay

## 2020-05-27 DIAGNOSIS — L97521 Non-pressure chronic ulcer of other part of left foot limited to breakdown of skin: Secondary | ICD-10-CM

## 2020-05-27 DIAGNOSIS — L97514 Non-pressure chronic ulcer of other part of right foot with necrosis of bone: Secondary | ICD-10-CM

## 2020-05-27 NOTE — Addendum Note (Signed)
Addended by: Hardie Pulley on: 05/27/2020 09:59 AM   Modules accepted: Level of Service

## 2020-05-27 NOTE — Progress Notes (Addendum)
  Subjective:  Patient ID: [name], male    DOB: Mar 26, 1971,  MRN: [id number]  Chief Complaint  Patient presents with  . Routine Post Op    POV#1 -pt denies N/V?F?Ch - dressing intact -pt states," foot has been throbbing a lot; 5/10 occasional pain." - worse with walking Tx: sx shoe, icing and elevation    DOS: 03/27/20 Procedure: TMA with Rotation flap  49 y.o. male presents for wound care. Hx confirmed with patient.   Also requests that I look at the left foot today. He is having pain. Objective:  Physical Exam: Wound Location: right lateral foot Wound Measurement: 2x7.5, 2x0.3 Wound Base: Mixed Granular/Fibrotic Peri-wound: Normal Exudate: Scant/small amount Serosanguinous exudate wound without warmth, erythema, signs of acute infection  Left foot plantar ulceration with only minor skin breakdown. Assessment:   1. 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  Ulcer right foot, s/p TMA -Again would benefit from debridement of the wound, given worsening. -Patient has failed all conservative therapy and wishes to proceed with surgical intervention. All risks, benefits, and alternatives discussed with patient. No guarantees given. Consent reviewed and signed by patient. -Planned procedures: Right foot debridement and irrigation with application of skin graft substitute  -ASA 3 - Patient with moderate systemic disease with functional limitations; Risk factors: DM -Post-op anticoagulation: chemoprophylaxis not indicated   Left foot ulcer -Minimal debridement today -Dressed with povidone and mepilex -Would benefit from surgical shoe, rx sent for [facility] for patient.  No follow-ups on file.

## 2020-05-27 NOTE — H&P (View-Only) (Signed)
  Subjective:  Patient ID: [name], male    DOB: 09-02-1970,  MRN: [id number]  Chief Complaint  Patient presents with  . Routine Post Op    POV#1 -pt denies N/V?F?Ch - dressing intact -pt states," foot has been throbbing a lot; 5/10 occasional pain." - worse with walking Tx: sx shoe, icing and elevation    DOS: 03/27/20 Procedure: TMA with Rotation flap  50 y.o. male presents for wound care. Hx confirmed with patient.   Also requests that I look at the left foot today. He is having pain. Objective:  Physical Exam: Wound Location: right lateral foot Wound Measurement: 2x7.5, 2x0.3 Wound Base: Mixed Granular/Fibrotic Peri-wound: Normal Exudate: Scant/small amount Serosanguinous exudate wound without warmth, erythema, signs of acute infection  Left foot plantar ulceration with only minor skin breakdown. Assessment:   1. 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  Ulcer right foot, s/p TMA -Again would benefit from debridement of the wound, given worsening. -Patient has failed all conservative therapy and wishes to proceed with surgical intervention. All risks, benefits, and alternatives discussed with patient. No guarantees given. Consent reviewed and signed by patient. -Planned procedures: Right foot debridement and irrigation with application of skin graft substitute  -ASA 3 - Patient with moderate systemic disease with functional limitations; Risk factors: DM -Post-op anticoagulation: chemoprophylaxis not indicated   Left foot ulcer -Minimal debridement today -Dressed with povidone and mepilex -Would benefit from surgical shoe, rx sent for [facility] for patient.  No follow-ups on file.

## 2020-05-28 ENCOUNTER — Ambulatory Visit
Admission: EM | Admit: 2020-05-28 | Discharge: 2020-05-28 | Disposition: A | Discharge status: Home / Self Care | Payer: Medicaid Other | Attending: Family Medicine | Admitting: Family Medicine

## 2020-05-28 ENCOUNTER — Other Ambulatory Visit: Payer: Self-pay

## 2020-05-28 ENCOUNTER — Ambulatory Visit (INDEPENDENT_AMBULATORY_CARE_PROVIDER_SITE_OTHER): Payer: Medicaid Other

## 2020-05-28 ENCOUNTER — Encounter: Payer: Self-pay | Admitting: Emergency Medicine

## 2020-05-28 DIAGNOSIS — R079 Chest pain, unspecified: Secondary | ICD-10-CM | POA: Diagnosis not present

## 2020-05-28 DIAGNOSIS — I160 Hypertensive urgency: Secondary | ICD-10-CM

## 2020-05-28 DIAGNOSIS — R000 Tachycardia, unspecified: Secondary | ICD-10-CM

## 2020-05-28 MED ORDER — CLONIDINE HCL 0.1 MG PO TABS
0.1000 mg | ORAL_TABLET | Freq: Once | ORAL | Status: AC
  Administered 2020-05-28: 0.1 mg via ORAL
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
## 2020-05-28 NOTE — Discharge Instructions (Addendum)
Go to [facility]  [address] [place], [place] [address] [phone] For further work-up to rule out a blood clot of the right lung and blood work to evaluate the cause of your beating at a rapid pace.
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
## 2020-05-29 ENCOUNTER — Telehealth: Payer: Self-pay

## 2020-05-29 NOTE — Telephone Encounter (Signed)
DOS 06/04/2020  DEBRIDE WOUND RT - [id number] APPLICATION SKIN GRAFT RT - [id number]  RECEIVED AUTH FAX FROM [facility]. PER [employer], NO PRECERT REQUIRED FOR CPT 11043. APPROVED AUTH FOR CPT [id number], AUTH # [id number] GOOD FOR 06/04/2020 ONLY

## 2020-05-31 ENCOUNTER — Other Ambulatory Visit (HOSPITAL_COMMUNITY)
Admission: RE | Admit: 2020-05-31 | Discharge: 2020-05-31 | Disposition: A | Discharge status: Home / Self Care | Payer: Medicaid Other | Source: Ambulatory Visit | Attending: Podiatry | Admitting: Podiatry

## 2020-05-31 DIAGNOSIS — Z01812 Encounter for preprocedural laboratory examination: Secondary | ICD-10-CM | POA: Insufficient documentation

## 2020-05-31 DIAGNOSIS — Z20822 Contact with and (suspected) exposure to covid-19: Secondary | ICD-10-CM | POA: Diagnosis not present

## 2020-05-31 LAB — SARS CORONAVIRUS 2 (TAT 6-24 HRS): SARS Coronavirus 2: NEGATIVE

## 2020-06-02 ENCOUNTER — Encounter (HOSPITAL_BASED_OUTPATIENT_CLINIC_OR_DEPARTMENT_OTHER): Payer: Self-pay | Admitting: Podiatry

## 2020-06-02 ENCOUNTER — Other Ambulatory Visit: Payer: Self-pay
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
## 2020-06-03 ENCOUNTER — Encounter: Payer: Self-pay | Admitting: Nurse Practitioner

## 2020-06-03 ENCOUNTER — Ambulatory Visit: Payer: Medicaid Other | Attending: Nurse Practitioner | Admitting: Nurse Practitioner

## 2020-06-03 DIAGNOSIS — F32A Depression, unspecified: Secondary | ICD-10-CM

## 2020-06-03 DIAGNOSIS — F17200 Nicotine dependence, unspecified, uncomplicated: Secondary | ICD-10-CM

## 2020-06-03 DIAGNOSIS — Z09 Encounter for follow-up examination after completed treatment for conditions other than malignant neoplasm: Secondary | ICD-10-CM | POA: Diagnosis not present

## 2020-06-03 DIAGNOSIS — F419 Anxiety disorder, unspecified: Secondary | ICD-10-CM

## 2020-06-03 MED ORDER — NICOTINE 21 MG/24HR TD PT24: 21.0000 mg | MEDICATED_PATCH | Freq: Every day | TRANSDERMAL | 0 refills | Status: AC
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
## 2020-06-04 ENCOUNTER — Ambulatory Visit (HOSPITAL_BASED_OUTPATIENT_CLINIC_OR_DEPARTMENT_OTHER): Payer: Medicaid Other | Admitting: Anesthesiology

## 2020-06-04 ENCOUNTER — Encounter (HOSPITAL_BASED_OUTPATIENT_CLINIC_OR_DEPARTMENT_OTHER)
Admission: RE | Disposition: A | Discharge status: Home / Self Care | Payer: Self-pay | Source: Home / Self Care | Attending: Podiatry

## 2020-06-04 ENCOUNTER — Ambulatory Visit (HOSPITAL_BASED_OUTPATIENT_CLINIC_OR_DEPARTMENT_OTHER)
Admission: RE | Admit: 2020-06-04 | Discharge: 2020-06-04 | Disposition: A | Discharge status: Home / Self Care | Payer: Medicaid Other | Attending: Podiatry | Admitting: Podiatry

## 2020-06-04 ENCOUNTER — Encounter: Payer: Self-pay | Admitting: Podiatry

## 2020-06-04 ENCOUNTER — Encounter (HOSPITAL_BASED_OUTPATIENT_CLINIC_OR_DEPARTMENT_OTHER): Payer: Self-pay | Admitting: Podiatry

## 2020-06-04 DIAGNOSIS — L97514 Non-pressure chronic ulcer of other part of right foot with necrosis of bone: Secondary | ICD-10-CM | POA: Diagnosis not present

## 2020-06-04 DIAGNOSIS — E11621 Type 2 diabetes mellitus with foot ulcer: Secondary | ICD-10-CM | POA: Diagnosis not present

## 2020-06-04 DIAGNOSIS — L97519 Non-pressure chronic ulcer of other part of right foot with unspecified severity: Secondary | ICD-10-CM | POA: Diagnosis present

## 2020-06-04 DIAGNOSIS — T8753 Necrosis of amputation stump, right lower extremity: Secondary | ICD-10-CM | POA: Insufficient documentation

## 2020-06-04 DIAGNOSIS — Y838 Other surgical procedures as the cause of abnormal reaction of the patient, or of later complication, without mention of misadventure at the time of the procedure: Secondary | ICD-10-CM | POA: Insufficient documentation

## 2020-06-04 HISTORY — PX: IRRIGATION AND DEBRIDEMENT FOOT: SHX6602

## 2020-06-04 LAB — GLUCOSE, CAPILLARY
Glucose-Capillary: 113 mg/dL — ABNORMAL HIGH (ref 70–99)
Glucose-Capillary: 106 mg/dL — ABNORMAL HIGH (ref 70–99)

## 2020-06-04 SURGERY — IRRIGATION AND DEBRIDEMENT FOOT
Anesthesia: Monitor Anesthesia Care | Site: Foot | Laterality: Right

## 2020-06-04 MED ORDER — ACETAMINOPHEN 500 MG PO TABS
ORAL_TABLET | ORAL | Status: AC
  Filled 2020-06-04: qty 1

## 2020-06-04 MED ORDER — SODIUM CHLORIDE 0.9 % IV SOLN: INTRAVENOUS | Status: DC

## 2020-06-04 MED ORDER — ONDANSETRON HCL 4 MG/2ML IJ SOLN
INTRAMUSCULAR | Status: DC | PRN
Start: 2020-06-04 — End: 2020-06-04
  Administered 2020-06-04: 4 mg via INTRAVENOUS

## 2020-06-04 MED ORDER — AMISULPRIDE (ANTIEMETIC) 5 MG/2ML IV SOLN: 10.0000 mg | Freq: Once | INTRAVENOUS | Status: DC | PRN

## 2020-06-04 MED ORDER — PROPOFOL 500 MG/50ML IV EMUL
INTRAVENOUS | Status: AC
  Filled 2020-06-04: qty 50

## 2020-06-04 MED ORDER — PROPOFOL 500 MG/50ML IV EMUL
INTRAVENOUS | Status: DC | PRN
  Administered 2020-06-04: 50 ug/kg/min via INTRAVENOUS

## 2020-06-04 MED ORDER — BUPIVACAINE HCL (PF) 0.5 % IJ SOLN
INTRAMUSCULAR | Status: DC | PRN
  Administered 2020-06-04: 10 mL

## 2020-06-04 MED ORDER — HYDROCODONE-ACETAMINOPHEN 5-325 MG PO TABS: 1.0000 | ORAL_TABLET | ORAL | 0 refills | Status: DC | PRN

## 2020-06-04 MED ORDER — ACETAMINOPHEN 500 MG PO TABS
1000.0000 mg | ORAL_TABLET | Freq: Once | ORAL | Status: AC
  Administered 2020-06-04: 1000 mg via ORAL

## 2020-06-04 MED ORDER — FENTANYL CITRATE (PF) 100 MCG/2ML IJ SOLN
INTRAMUSCULAR | Status: AC
  Filled 2020-06-04: qty 2

## 2020-06-04 MED ORDER — MIDAZOLAM HCL 5 MG/5ML IJ SOLN
INTRAMUSCULAR | Status: DC | PRN
  Administered 2020-06-04: 2 mg via INTRAVENOUS

## 2020-06-04 MED ORDER — ONDANSETRON HCL 4 MG/2ML IJ SOLN
INTRAMUSCULAR | Status: AC
  Filled 2020-06-04: qty 2

## 2020-06-04 MED ORDER — VANCOMYCIN HCL 1 G IV SOLR
INTRAVENOUS | Status: DC | PRN
  Administered 2020-06-04: 1000 mg via TOPICAL

## 2020-06-04 MED ORDER — CLINDAMYCIN PHOSPHATE 900 MG/50ML IV SOLN
INTRAVENOUS | Status: AC
  Filled 2020-06-04: qty 50

## 2020-06-04 MED ORDER — CLINDAMYCIN PHOSPHATE 900 MG/50ML IV SOLN
900.0000 mg | INTRAVENOUS | Status: AC
  Administered 2020-06-04: 900 mg via INTRAVENOUS

## 2020-06-04 MED ORDER — PROPOFOL 10 MG/ML IV BOLUS
INTRAVENOUS | Status: DC | PRN
  Administered 2020-06-04: 30 mg via INTRAVENOUS

## 2020-06-04 MED ORDER — FENTANYL CITRATE (PF) 100 MCG/2ML IJ SOLN
INTRAMUSCULAR | Status: DC | PRN
  Administered 2020-06-04 (×2): 25 ug via INTRAVENOUS

## 2020-06-04 MED ORDER — CLINDAMYCIN HCL 150 MG PO CAPS: 150.0000 mg | ORAL_CAPSULE | Freq: Two times a day (BID) | ORAL | 0 refills | Status: DC

## 2020-06-04 MED ORDER — LIDOCAINE 2% (20 MG/ML) 5 ML SYRINGE
INTRAMUSCULAR | Status: AC
  Filled 2020-06-04: qty 5

## 2020-06-04 MED ORDER — MIDAZOLAM HCL 2 MG/2ML IJ SOLN
INTRAMUSCULAR | Status: AC
  Filled 2020-06-04: qty 2

## 2020-06-04 MED ORDER — FENTANYL CITRATE (PF) 100 MCG/2ML IJ SOLN
25.0000 ug | INTRAMUSCULAR | Status: DC | PRN
Start: 2020-06-04 — End: 2020-06-04

## 2020-06-04 MED ORDER — ACETAMINOPHEN 500 MG PO TABS
ORAL_TABLET | ORAL | Status: AC
  Filled 2020-06-04: qty 2

## 2020-06-04 SURGICAL SUPPLY — 63 items
ALLOGRAFT NUSHIELD 6X6 36SQ (Tissue) ×1 IMPLANT
APL PRP STRL LF DISP 70% ISPRP (MISCELLANEOUS)
BLADE SURG 15 STRL LF DISP TIS (BLADE) ×1 IMPLANT
BLADE SURG 15 STRL SS (BLADE) ×2
BNDG CMPR 9X4 STRL LF SNTH (GAUZE/BANDAGES/DRESSINGS)
BNDG ELASTIC 3X5.8 VLCR STR LF (GAUZE/BANDAGES/DRESSINGS) ×2 IMPLANT
BNDG ELASTIC 4X5.8 VLCR STR LF (GAUZE/BANDAGES/DRESSINGS) ×2 IMPLANT
BNDG ESMARK 4X9 LF (GAUZE/BANDAGES/DRESSINGS) IMPLANT
BNDG GAUZE ELAST 4 BULKY (GAUZE/BANDAGES/DRESSINGS) ×2 IMPLANT
BRUSH SCRUB EZ PLAIN DRY (MISCELLANEOUS) ×1 IMPLANT
CHLORAPREP W/TINT 26 (MISCELLANEOUS) IMPLANT
CNTNR URN SCR LID CUP LEK RST (MISCELLANEOUS) IMPLANT
CONT SPEC 4OZ STRL OR WHT (MISCELLANEOUS)
COVER BACK TABLE 60X90IN (DRAPES) ×2 IMPLANT
COVER WAND RF STERILE (DRAPES) ×1 IMPLANT
CUFF TOURN SGL QUICK 18X4 (TOURNIQUET CUFF) IMPLANT
CUFF TOURN SGL QUICK 24 (TOURNIQUET CUFF)
CUFF TRNQT CYL 24X4X16.5-23 (TOURNIQUET CUFF) IMPLANT
DRAPE 3/4 80X56 (DRAPES) ×1 IMPLANT
DRAPE EXTREMITY T 121X128X90 (DISPOSABLE) ×2 IMPLANT
DRAPE SHEET LG 3/4 BI-LAMINATE (DRAPES) ×2 IMPLANT
DRAPE U-SHAPE 47X51 STRL (DRAPES) ×1 IMPLANT
DRSG OPSITE POSTOP 3X4 (GAUZE/BANDAGES/DRESSINGS) ×1 IMPLANT
ELECT REM PT RETURN 9FT ADLT (ELECTROSURGICAL)
ELECTRODE REM PT RTRN 9FT ADLT (ELECTROSURGICAL) ×1 IMPLANT
GAUZE SPONGE 4X4 12PLY STRL (GAUZE/BANDAGES/DRESSINGS) ×2 IMPLANT
GAUZE XEROFORM 1X8 LF (GAUZE/BANDAGES/DRESSINGS) IMPLANT
GLOVE SRG 8 PF TXTR STRL LF DI (GLOVE) ×1 IMPLANT
GLOVE SURG ENC MOIS LTX SZ7.5 (GLOVE) ×2 IMPLANT
GLOVE SURG UNDER POLY LF SZ8 (GLOVE) ×2
GOWN STRL REUS W/TWL XL LVL3 (GOWN DISPOSABLE) ×2 IMPLANT
IV NS 1000ML (IV SOLUTION) ×2
IV NS 1000ML BAXH (IV SOLUTION) IMPLANT
KIT TURNOVER CYSTO (KITS) ×2 IMPLANT
MANIFOLD NEPTUNE II (INSTRUMENTS) ×2 IMPLANT
NDL HYPO 25X1 1.5 SAFETY (NEEDLE) ×1 IMPLANT
NEEDLE HYPO 25X1 1.5 SAFETY (NEEDLE) ×2 IMPLANT
NS IRRIG 1000ML POUR BTL (IV SOLUTION) IMPLANT
NS IRRIG 500ML POUR BTL (IV SOLUTION) ×1 IMPLANT
PACK BASIN DAY SURGERY FS (CUSTOM PROCEDURE TRAY) ×2 IMPLANT
PADDING CAST ABS 4INX4YD NS (CAST SUPPLIES)
PADDING CAST ABS COTTON 4X4 ST (CAST SUPPLIES) ×1 IMPLANT
PENCIL SMOKE EVAC W/HOLSTER (ELECTROSURGICAL) ×1 IMPLANT
PROBE DEBRIDE SONICVAC MISONIX (TIP) ×1 IMPLANT
SET IRRIG Y TYPE TUR BLADDER L (SET/KITS/TRAYS/PACK) IMPLANT
SPONGE LAP 4X18 RFD (DISPOSABLE) ×2 IMPLANT
STAPLER VISISTAT 35W (STAPLE) ×1 IMPLANT
STOCKINETTE 6  STRL (DRAPES) ×2
STOCKINETTE 6 STRL (DRAPES) ×1 IMPLANT
SUCTION FRAZIER HANDLE 10FR (MISCELLANEOUS)
SUCTION TUBE FRAZIER 10FR DISP (MISCELLANEOUS) IMPLANT
SUT ETHILON 3 0 PS 1 (SUTURE) ×1 IMPLANT
SUT ETHILON 4 0 PS 2 18 (SUTURE) IMPLANT
SUT MNCRL AB 3-0 PS2 18 (SUTURE) IMPLANT
SUT MNCRL AB 4-0 PS2 18 (SUTURE) IMPLANT
SUT VIC AB 2-0 SH 27 (SUTURE)
SUT VIC AB 2-0 SH 27XBRD (SUTURE) IMPLANT
SYR BULB EAR ULCER 3OZ GRN STR (SYRINGE) ×2 IMPLANT
SYR CONTROL 10ML LL (SYRINGE) ×2 IMPLANT
TRAY DSU PREP LF (CUSTOM PROCEDURE TRAY) ×2 IMPLANT
TUBE IRRIGATION SET MISONIX (TUBING) ×1 IMPLANT
UNDERPAD 30X36 HEAVY ABSORB (UNDERPADS AND DIAPERS) ×3 IMPLANT
YANKAUER SUCT BULB TIP NO VENT (SUCTIONS) ×2 IMPLANT

## 2020-06-04 NOTE — Op Note (Signed)
Patient Name: [name] DOB: 07/15/1970  MRN: [id number]   Date of Service: 06/04/20   Surgeon: Dr. Hardie Pulley, DPM Assistants: None Pre-operative Diagnosis: Ulcer right foot Post-operative Diagnosis: same Procedures:             1) Debridement and irrigation right foot wound  2) Partial wound closure right foot wounds Pathology/Specimens: * No specimens in log * Anesthesia: MAC/local Hemostasis: Anatomic Estimated Blood Loss: 11m Materials:  Implant Name Type Inv. Item Serial No. Manufacturer Lot No. LRB No. Used Action  Organogenesis NuShield    [employer] [id number] Implanted    Medications: 1g vancomycin powder topically. Complications: None  Indications for Procedure:  This is a 50y.o. 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  He presents for planned debridement of the wound to promote healing.   Procedure in Detail: Patient was identified in pre-operative holding area. Formal consent was signed and the right lower extremity was marked. Patient was brought back to the operating room and remained on a stretcher and the supine position. Anesthesia was induced.   The extremity was prepped and draped in the usual sterile fashion. Timeout was taken to confirm patient name, laterality, and procedure prior to incision. Attention was then directed to the right foot where a wound measuring 6 x 1.5 was noted at the anterior margin, and 3 x 1 at the lateral margin.  Both wounds were sharply excisionally debrided with a 15 blade, followed by a misonix ultrasonic debrider. Debridement was performed to bleeding, viable wound base. The wound was debrided to the level of the fascial tissue.  Partial wound closure of the anterolateral margin of the anterior wound was performed with 3-0 nylon.  The lateral wound was similarly closed with 3-0 nylon.  A resulting 5 x 1.5 wound remained anteriorly.  In organogenesis NuShield graft was  applied to the wound and hydrated.  It was held in place with staples.  A non adherent honeycomb dressing was applied over this.  The foot was then further dressed with 4 x 4's Kerlix and Ace bandage.   Disposition: Following a period of post-operative monitoring, patient will be transferred back home.

## 2020-06-04 NOTE — Discharge Instructions (Signed)
  After Surgery Instructions   1) If you are recuperating from surgery anywhere other than home, please be sure to leave [place] the number where you can be reached.  2) Go directly home and rest.  3) Keep the operated foot(feet) elevated six inches above the hip when sitting or lying down. This will help control swelling and pain.  4) Support the elevated foot and leg with pillows. DO NOT PLACE PILLOWS UNDER THE KNEE.  5) DO NOT REMOVE or get your bandages WET, unless you were given different instructions by your doctor to do so. This increases the risk of infection.  6) Wear your surgical shoe or surgical boot at all times when you are up on your feet.  7) A limited amount of pain and swelling may occur. The skin may take on a bruised appearance. DO NOT BE ALARMED, THIS IS NORMAL.  8) For slight pain and swelling, apply an ice pack directly over the bandages for 15 minutes only out of each hour of the day. Continue until seen in the office for your first post op visit. DO NOT APPLY ANY FORM OF HEAT TO THE AREA.  9) Have prescriptions filled immediately and take as directed.  10) Drink lots of liquids, water and juice to stay hydrated.  11) CALL IMMEDIATELY IF:  *Bleeding continues until the following day of surgery  *Pain increases and/or does not respond to medication  *Bandages or cast appears to tight  *If your bandage gets wet  *Trip, fall or stump your surgical foot  *If your temperature goes above 101  *If you have ANY questions at all  12) You are expected to be weightbearing after your surgery.   If you need to reach the nurse for any reason, please call: [place]: [phone] [place]: [phone] [place]: [phone]   Post Anesthesia Home Care Instructions  Activity: Get plenty of rest for the remainder of the day. A responsible adult should stay with you for 24 hours following the procedure.  For the next 24 hours, DO NOT: -Drive a  car -Paediatric nurse -Drink alcoholic beverages -Take any medication unless instructed by your physician -Make any legal decisions or sign important papers.  Meals: Start with liquid foods such as gelatin or soup. Progress to regular foods as tolerated. Avoid greasy, spicy, heavy foods. If nausea and/or vomiting occur, drink only clear liquids until the nausea and/or vomiting subsides. Call your physician if vomiting continues.  Special Instructions/Symptoms: Your throat may feel dry or sore from the anesthesia or the breathing tube placed in your throat during surgery. If this causes discomfort, gargle with warm salt water. The discomfort should disappear within 24 hours.

## 2020-06-04 NOTE — Transfer of Care (Signed)
Immediate Anesthesia Transfer of Care Note  Patient: [name]  Procedure(s) Performed: Procedure(s) (LRB): IRRIGATION AND DEBRIDEMENT FOOT, APPLICATION OF SKIN GRAFT SUBSTITUTE (Right)  Patient Location: PACU  Anesthesia Type: MAC  Level of Consciousness: awake, sedated, patient cooperative and responds to stimulation  Airway & Oxygen Therapy: Patient Spontanous Breathing and Patient connected to face mask oxygen HOB elevated   Post-op Assessment: Report given to PACU RN, Post -op Vital signs reviewed and stable and Patient moving all extremities  Post vital signs: Reviewed and stable  Complications: No apparent anesthesia complications

## 2020-06-04 NOTE — Interval H&P Note (Signed)
History and Physical Interval Note:  06/04/2020 12:29 PM  [name]  has presented today for surgery, with the diagnosis of Ulcer right foot.  The various methods of treatment have been discussed with the patient and family. After consideration of risks, benefits and other options for treatment, the patient has consented to  Procedure(s): IRRIGATION AND DEBRIDEMENT FOOT, Possible skin graft substitute (Right) as a surgical intervention.  The patient's history has been reviewed, patient examined, no change in status, stable for surgery.  I have reviewed the patient's chart and labs.  Questions were answered to the patient's satisfaction.     [name]

## 2020-06-04 NOTE — Anesthesia Procedure Notes (Signed)
Procedure Name: MAC Date/Time: 06/04/2020 1:04 PM Performed by: Justice Rocher, CRNA Pre-anesthesia Checklist: Patient identified, Emergency Drugs available, Suction available, Patient being monitored and Timeout performed Patient Re-evaluated:Patient Re-evaluated prior to induction Oxygen Delivery Method: Simple face mask Preoxygenation: Pre-oxygenation with 100% oxygen Induction Type: IV induction Placement Confirmation: positive ETCO2 and breath sounds checked- equal and bilateral Comments: PT with O2 FM with O/A requiring chin hold intermittently during the OR course. Pt stable

## 2020-06-04 NOTE — Anesthesia Postprocedure Evaluation (Signed)
Anesthesia Post Note  Patient: [name]  Procedure(s) Performed: IRRIGATION AND DEBRIDEMENT FOOT, APPLICATION OF SKIN GRAFT SUBSTITUTE (Right Foot)     Patient location during evaluation: PACU Anesthesia Type: MAC Level of consciousness: awake and alert Pain management: pain level controlled Vital Signs Assessment: post-procedure vital signs reviewed and stable Respiratory status: spontaneous breathing, nonlabored ventilation, respiratory function stable and patient connected to nasal cannula oxygen Cardiovascular status: stable and blood pressure returned to baseline Postop Assessment: no apparent nausea or vomiting Anesthetic complications: no   No complications documented.  Last Vitals:  Vitals:   06/04/20 1500 06/04/20 1524  BP: 117/75 120/75  Pulse:  71  Resp: 19 16  Temp: (!) 36.1 C 36.4 C  SpO2: 96% 100%    Last Pain:  Vitals:   06/04/20 1524  TempSrc:   PainSc: 3                  Tiajuana Amass

## 2020-06-04 NOTE — Anesthesia Preprocedure Evaluation (Signed)
Anesthesia Evaluation  Patient identified by MRN, date of birth, ID band Patient awake    Reviewed: Allergy & Precautions, NPO status , Patient's Chart, lab work & pertinent test results, reviewed documented beta blocker date and time   Airway Mallampati: II  TM Distance: >3 FB     Dental  (+) Dental Advisory Given   Pulmonary Current Smoker and Patient abstained from smoking.,    breath sounds clear to auscultation       Cardiovascular hypertension, Pt. on medications and Pt. on home beta blockers + CAD   Rhythm:Regular Rate:Normal     Neuro/Psych  Neuromuscular disease    GI/Hepatic negative GI ROS, Neg liver ROS,   Endo/Other  diabetes, Type 2, Insulin Dependent  Renal/GU CRFRenal disease     Musculoskeletal   Abdominal   Peds  Hematology  (+) anemia ,   Anesthesia Other Findings   Reproductive/Obstetrics                             Anesthesia Physical  Anesthesia Plan  ASA: III  Anesthesia Plan: MAC   Post-op Pain Management:    Induction:   PONV Risk Score and Plan: 0 and Propofol infusion, Ondansetron and Treatment may vary due to age or medical condition  Airway Management Planned: Natural Airway and Simple Face Mask  Additional Equipment: None  Intra-op Plan:   Post-operative Plan:   Informed Consent: I have reviewed the patients History and Physical, chart, labs and discussed the procedure including the risks, benefits and alternatives for the proposed anesthesia with the patient or authorized representative who has indicated his/her understanding and acceptance.     Dental advisory given  Plan Discussed with: CRNA  Anesthesia Plan Comments:         Anesthesia Quick Evaluation

## 2020-06-05 ENCOUNTER — Telehealth: Payer: Self-pay | Admitting: Nurse Practitioner

## 2020-06-05 NOTE — Telephone Encounter (Signed)
Verbal order were given for patient. 

## 2020-06-05 NOTE — Telephone Encounter (Signed)
Home Health Verbal Orders - Caller/Agency: [facility] Number: [phone] Requesting OT/PT/Skilled Nursing/Social Work/Speech Therapy: Nursing to continue to treat Frequency: 1 week 9

## 2020-06-06 ENCOUNTER — Encounter (HOSPITAL_BASED_OUTPATIENT_CLINIC_OR_DEPARTMENT_OTHER): Payer: Self-pay | Admitting: Podiatry

## 2020-06-06 NOTE — Telephone Encounter (Signed)
[facility]  Callback Number: [phone]  Requesting /PT  Frequency: 1  X 8 weeks

## 2020-06-10 ENCOUNTER — Other Ambulatory Visit: Payer: Self-pay

## 2020-06-10 ENCOUNTER — Ambulatory Visit (INDEPENDENT_AMBULATORY_CARE_PROVIDER_SITE_OTHER): Payer: Medicaid Other | Admitting: Podiatry

## 2020-06-10 DIAGNOSIS — L97514 Non-pressure chronic ulcer of other part of right foot with necrosis of bone: Secondary | ICD-10-CM

## 2020-06-10 NOTE — Progress Notes (Signed)
  Subjective:  Patient ID: [name], male    DOB: April 15, 1970,  MRN: [id number]  Chief Complaint  Patient presents with  . wound care    Right foot wound care - no discharge - no bleeding.    DOS: 03/27/20 Procedure: TMA with Rotation flap  50 y.o. male presents for wound care. Hx confirmed with patient.  Did not change dressings and surgery denies postop issues or concerns.  Home health care has not come due to surgery.  [facility] with [facility] for [facility] care Objective:  Physical Exam: Wound Location: right lateral foot Wound Measurement:4x2.5 distal Wound Base: Mixed Granular/Fibrotic Peri-wound: Normal Exudate: Large amount Serosanguinous exudate wound without warmth, erythema, signs of acute infection Incisions healing well with intact staple and suture material  Assessment:   1. 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  Ulcer right foot, s/p TMA -Improving s/p debridement and partial closure. -New [facility] orders written for silver alginate dressing to be applied every other day -We did discuss the importance of limiting weightbearing to prevent worsening -Dressed with Betadine wet-to-dry today  Return in about 2 weeks (around 06/24/2020) for Wound Care.

## 2020-06-10 NOTE — Telephone Encounter (Signed)
Verbal orders were given to pt.

## 2020-06-12 ENCOUNTER — Telehealth: Payer: Self-pay | Admitting: *Deleted

## 2020-06-12 NOTE — Telephone Encounter (Signed)
I'm fine with those orders please tell her they are approved.

## 2020-06-12 NOTE — Telephone Encounter (Signed)
Clarise Cruz w/ [facility] [phone]) is requesting wound care orders for patient.   She is requesting for the amputation site:cleanse wound with normal saline,apply medi honey and cover with dry dressing.  For the incision site: lateral aspect, paint with betadine, wrap whole foot with keflex daily.Please advise

## 2020-06-16 NOTE — Telephone Encounter (Signed)
Called and informed Judson Roch w/ [facility] that [name] had approved her requested orders for patient, verbalized understanding.

## 2020-06-23 ENCOUNTER — Encounter: Payer: Self-pay | Admitting: Nurse Practitioner

## 2020-06-23 ENCOUNTER — Other Ambulatory Visit: Payer: Self-pay

## 2020-06-23 ENCOUNTER — Ambulatory Visit: Payer: Medicaid Other | Attending: Nurse Practitioner | Admitting: Nurse Practitioner

## 2020-06-23 VITALS — BP 174/104 | HR 103 | Resp 20 | Ht 64.0 in | Wt 177.0 lb

## 2020-06-23 DIAGNOSIS — E1165 Type 2 diabetes mellitus with hyperglycemia: Secondary | ICD-10-CM | POA: Diagnosis not present

## 2020-06-23 DIAGNOSIS — IMO0002 Reserved for concepts with insufficient information to code with codable children: Secondary | ICD-10-CM

## 2020-06-23 DIAGNOSIS — E1149 Type 2 diabetes mellitus with other diabetic neurological complication: Secondary | ICD-10-CM

## 2020-06-23 DIAGNOSIS — E1159 Type 2 diabetes mellitus with other circulatory complications: Secondary | ICD-10-CM | POA: Diagnosis not present

## 2020-06-23 DIAGNOSIS — D72829 Elevated white blood cell count, unspecified: Secondary | ICD-10-CM | POA: Diagnosis not present

## 2020-06-23 DIAGNOSIS — I152 Hypertension secondary to endocrine disorders: Secondary | ICD-10-CM

## 2020-06-23 LAB — POCT GLYCOSYLATED HEMOGLOBIN (HGB A1C): HbA1c, POC (controlled diabetic range): 7 % (ref 0.0–7.0)

## 2020-06-23 LAB — GLUCOSE, POCT (MANUAL RESULT ENTRY): POC Glucose: 133 mg/dl — AB (ref 70–99)

## 2020-06-23 MED ORDER — CLONIDINE HCL 0.2 MG PO TABS
0.2000 mg | ORAL_TABLET | Freq: Once | ORAL | Status: AC
  Administered 2020-06-23: 0.2 mg via ORAL

## 2020-06-23 MED ORDER — CLONIDINE HCL 0.2 MG PO TABS: 0.2000 mg | ORAL_TABLET | Freq: Once | ORAL | Status: DC
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
## 2020-06-24 ENCOUNTER — Ambulatory Visit (INDEPENDENT_AMBULATORY_CARE_PROVIDER_SITE_OTHER): Payer: Medicaid Other | Admitting: Podiatry

## 2020-06-24 ENCOUNTER — Ambulatory Visit: Payer: Medicaid Other | Attending: Nurse Practitioner

## 2020-06-24 DIAGNOSIS — L97521 Non-pressure chronic ulcer of other part of left foot limited to breakdown of skin: Secondary | ICD-10-CM

## 2020-06-24 DIAGNOSIS — L97514 Non-pressure chronic ulcer of other part of right foot with necrosis of bone: Secondary | ICD-10-CM

## 2020-06-24 NOTE — Progress Notes (Signed)
  Subjective:  Patient ID: [name], male    DOB: May 05, 1970,  MRN: [id number]  Chief Complaint  Patient presents with  . Routine Post Op     Return in about 2 weeks (around 06/24/2020) for Wound Care POV #3 DOS 05/16/2020 & 3/9/20222RT FOOT WOUND DEBRIDMENT & IRRIGATION, POSS SKIN GRAFT SUBSTITIUTE   DOS: 03/27/20 Procedure: TMA with Rotation flap  50 y.o. male presents for wound care. Hx confirmed with patient.  States he is having trouble with his left foot as well  Objective:  Physical Exam: Wound Location: right lateral foot Wound Measurement:2x3 Wound Base: Mixed Granular/Fibrotic Peri-wound: Normal Exudate: Large amount Serosanguinous exudate wound without warmth, erythema, signs of acute infection Incisions healing well with intact staple and suture material  Assessment:   1. 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 Ulcer of left foot, limited to breakdown of skin [facility])    Plan:  Patient was evaluated and treated and all questions answered.  Ulcer right foot, s/p TMA -Improving s/p debridement and partial closure. -We did discuss the importance of limiting weightbearing to prevent worsening -Dressed with Betadine wet-to-dry today  No follow-ups on file.

## 2020-06-25 LAB — CBC WITH DIFFERENTIAL/PLATELET
Basophils Absolute: 0.1 10*3/uL (ref 0.0–0.2)
Basos: 1 %
EOS (ABSOLUTE): 0.3 10*3/uL (ref 0.0–0.4)
Eos: 3 %
Hematocrit: 33.5 % — ABNORMAL LOW (ref 37.5–51.0)
Hemoglobin: 10.8 g/dL — ABNORMAL LOW (ref 13.0–17.7)
Immature Grans (Abs): 0 10*3/uL (ref 0.0–0.1)
Immature Granulocytes: 0 %
Lymphocytes Absolute: 3.3 10*3/uL — ABNORMAL HIGH (ref 0.7–3.1)
Lymphs: 26 %
MCH: 27 pg (ref 26.6–33.0)
MCHC: 32.2 g/dL (ref 31.5–35.7)
MCV: 84 fL (ref 79–97)
Monocytes Absolute: 0.8 10*3/uL (ref 0.1–0.9)
Monocytes: 6 %
Neutrophils Absolute: 8.1 10*3/uL — ABNORMAL HIGH (ref 1.4–7.0)
Neutrophils: 64 %
Platelets: 437 10*3/uL (ref 150–450)
RBC: 4 x10E6/uL — ABNORMAL LOW (ref 4.14–5.80)
RDW: 14.1 % (ref 11.6–15.4)
WBC: 12.7 10*3/uL — ABNORMAL HIGH (ref 3.4–10.8)

## 2020-06-29 ENCOUNTER — Other Ambulatory Visit: Payer: Self-pay | Admitting: Nurse Practitioner

## 2020-06-29 DIAGNOSIS — D72828 Other elevated white blood cell count: Secondary | ICD-10-CM

## 2020-06-30 ENCOUNTER — Telehealth: Payer: Self-pay | Admitting: Physician Assistant

## 2020-06-30 NOTE — Telephone Encounter (Signed)
Received a new hem referral from [name], NP for Granulocytosis.[name] has been cld and scheduled to see Murray Hodgkins on 4/8 at 1pm. Pt aware to arrive 20 minutes early.
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
## 2020-07-04 ENCOUNTER — Inpatient Hospital Stay: Payer: Medicaid Other

## 2020-07-04 ENCOUNTER — Inpatient Hospital Stay: Payer: No Typology Code available for payment source | Admitting: Physician Assistant

## 2020-07-04 ENCOUNTER — Inpatient Hospital Stay: Payer: Medicaid Other | Attending: Physician Assistant | Admitting: Physician Assistant

## 2020-07-04 ENCOUNTER — Other Ambulatory Visit: Payer: Self-pay

## 2020-07-04 ENCOUNTER — Encounter: Payer: Self-pay | Admitting: Physician Assistant

## 2020-07-04 ENCOUNTER — Inpatient Hospital Stay: Payer: No Typology Code available for payment source

## 2020-07-04 ENCOUNTER — Telehealth: Payer: Self-pay | Admitting: Physician Assistant

## 2020-07-04 VITALS — BP 162/93 | HR 90 | Temp 95.2°F | Resp 18 | Wt 182.7 lb

## 2020-07-04 DIAGNOSIS — IMO0002 Reserved for concepts with insufficient information to code with codable children: Secondary | ICD-10-CM

## 2020-07-04 DIAGNOSIS — G629 Polyneuropathy, unspecified: Secondary | ICD-10-CM | POA: Insufficient documentation

## 2020-07-04 DIAGNOSIS — E139 Other specified diabetes mellitus without complications: Secondary | ICD-10-CM

## 2020-07-04 DIAGNOSIS — D72829 Elevated white blood cell count, unspecified: Secondary | ICD-10-CM | POA: Insufficient documentation

## 2020-07-04 DIAGNOSIS — D72825 Bandemia: Secondary | ICD-10-CM | POA: Diagnosis not present

## 2020-07-04 DIAGNOSIS — K5900 Constipation, unspecified: Secondary | ICD-10-CM | POA: Insufficient documentation

## 2020-07-04 DIAGNOSIS — Z79899 Other long term (current) drug therapy: Secondary | ICD-10-CM | POA: Diagnosis not present

## 2020-07-04 DIAGNOSIS — Z808 Family history of malignant neoplasm of other organs or systems: Secondary | ICD-10-CM | POA: Insufficient documentation

## 2020-07-04 DIAGNOSIS — Z801 Family history of malignant neoplasm of trachea, bronchus and lung: Secondary | ICD-10-CM | POA: Diagnosis not present

## 2020-07-04 DIAGNOSIS — D649 Anemia, unspecified: Secondary | ICD-10-CM | POA: Insufficient documentation

## 2020-07-04 DIAGNOSIS — F17210 Nicotine dependence, cigarettes, uncomplicated: Secondary | ICD-10-CM | POA: Insufficient documentation

## 2020-07-04 DIAGNOSIS — E785 Hyperlipidemia, unspecified: Secondary | ICD-10-CM | POA: Diagnosis not present

## 2020-07-04 DIAGNOSIS — Z720 Tobacco use: Secondary | ICD-10-CM | POA: Diagnosis not present

## 2020-07-04 DIAGNOSIS — Z7982 Long term (current) use of aspirin: Secondary | ICD-10-CM | POA: Diagnosis not present

## 2020-07-04 DIAGNOSIS — Z794 Long term (current) use of insulin: Secondary | ICD-10-CM | POA: Diagnosis not present

## 2020-07-04 DIAGNOSIS — E1149 Type 2 diabetes mellitus with other diabetic neurological complication: Secondary | ICD-10-CM

## 2020-07-04 DIAGNOSIS — N183 Chronic kidney disease, stage 3 unspecified: Secondary | ICD-10-CM | POA: Diagnosis not present

## 2020-07-04 DIAGNOSIS — E1165 Type 2 diabetes mellitus with hyperglycemia: Secondary | ICD-10-CM

## 2020-07-04 DIAGNOSIS — I10 Essential (primary) hypertension: Secondary | ICD-10-CM | POA: Insufficient documentation

## 2020-07-04 DIAGNOSIS — F17200 Nicotine dependence, unspecified, uncomplicated: Secondary | ICD-10-CM

## 2020-07-04 LAB — IRON AND TIBC
Iron: 45 ug/dL (ref 42–163)
Saturation Ratios: 17 % — ABNORMAL LOW (ref 20–55)
TIBC: 261 ug/dL (ref 202–409)
UIBC: 216 ug/dL (ref 117–376)

## 2020-07-04 LAB — CMP (CANCER CENTER ONLY)
ALT: 24 U/L (ref 0–44)
AST: 11 U/L — ABNORMAL LOW (ref 15–41)
Albumin: 3.1 g/dL — ABNORMAL LOW (ref 3.5–5.0)
Alkaline Phosphatase: 127 U/L — ABNORMAL HIGH (ref 38–126)
Anion gap: 12 (ref 5–15)
BUN: 26 mg/dL — ABNORMAL HIGH (ref 6–20)
CO2: 21 mmol/L — ABNORMAL LOW (ref 22–32)
Calcium: 8.5 mg/dL — ABNORMAL LOW (ref 8.9–10.3)
Chloride: 106 mmol/L (ref 98–111)
Creatinine: 2.38 mg/dL — ABNORMAL HIGH (ref 0.61–1.24)
GFR, Estimated: 32 mL/min — ABNORMAL LOW (ref >60)
Glucose, Bld: 139 mg/dL — ABNORMAL HIGH (ref 70–99)
Potassium: 4.3 mmol/L (ref 3.5–5.1)
Sodium: 139 mmol/L (ref 135–145)
Total Bilirubin: 0.3 mg/dL (ref 0.3–1.2)
Total Protein: 8 g/dL (ref 6.5–8.1)

## 2020-07-04 LAB — CBC WITH DIFFERENTIAL (CANCER CENTER ONLY)
Abs Immature Granulocytes: 0.05 10*3/uL (ref 0.00–0.07)
Basophils Absolute: 0 10*3/uL (ref 0.0–0.1)
Basophils Relative: 0 %
Eosinophils Absolute: 0.4 10*3/uL (ref 0.0–0.5)
Eosinophils Relative: 3 %
HCT: 30.1 % — ABNORMAL LOW (ref 39.0–52.0)
Hemoglobin: 9.4 g/dL — ABNORMAL LOW (ref 13.0–17.0)
Immature Granulocytes: 0 %
Lymphocytes Relative: 26 %
Lymphs Abs: 3.6 10*3/uL (ref 0.7–4.0)
MCH: 26.7 pg (ref 26.0–34.0)
MCHC: 31.2 g/dL (ref 30.0–36.0)
MCV: 85.5 fL (ref 80.0–100.0)
Monocytes Absolute: 0.7 10*3/uL (ref 0.1–1.0)
Monocytes Relative: 5 %
Neutro Abs: 9.3 10*3/uL — ABNORMAL HIGH (ref 1.7–7.7)
Neutrophils Relative %: 66 %
Platelet Count: 302 10*3/uL (ref 150–400)
RBC: 3.52 MIL/uL — ABNORMAL LOW (ref 4.22–5.81)
RDW: 15.5 % (ref 11.5–15.5)
WBC Count: 14 10*3/uL — ABNORMAL HIGH (ref 4.0–10.5)
nRBC: 0 % (ref 0.0–0.2)

## 2020-07-04 LAB — RETIC PANEL
Immature Retic Fract: 14.5 % (ref 2.3–15.9)
RBC.: 3.54 MIL/uL — ABNORMAL LOW (ref 4.22–5.81)
Retic Count, Absolute: 48.5 10*3/uL (ref 19.0–186.0)
Retic Ct Pct: 1.4 % (ref 0.4–3.1)
Reticulocyte Hemoglobin: 30 pg (ref >27.9)

## 2020-07-04 LAB — FERRITIN: Ferritin: 176 ng/mL (ref 24–336)

## 2020-07-04 LAB — FOLATE: Folate: 4.8 ng/mL — ABNORMAL LOW (ref >5.9)

## 2020-07-04 LAB — SAVE SMEAR(SSMR), FOR PROVIDER SLIDE REVIEW

## 2020-07-04 LAB — VITAMIN B12: Vitamin B-12: 222 pg/mL (ref 180–914)

## 2020-07-04 LAB — C-REACTIVE PROTEIN: CRP: 2.7 mg/dL — ABNORMAL HIGH (ref <1.0)

## 2020-07-04 LAB — SEDIMENTATION RATE: Sed Rate: 72 mm/hr — ABNORMAL HIGH (ref 0–16)
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
## 2020-07-04 NOTE — Telephone Encounter (Signed)
Moved patients appt to earlier time today per Provider request. Called and spoke with patient. He confirmed he will be here at the earlier time.

## 2020-07-05 DIAGNOSIS — D649 Anemia, unspecified: Secondary | ICD-10-CM | POA: Insufficient documentation

## 2020-07-05 DIAGNOSIS — D72829 Elevated white blood cell count, unspecified: Secondary | ICD-10-CM | POA: Insufficient documentation

## 2020-07-05 DIAGNOSIS — D631 Anemia in chronic kidney disease: Secondary | ICD-10-CM | POA: Insufficient documentation

## 2020-07-05 LAB — ERYTHROPOIETIN: Erythropoietin: 16.4 m[IU]/mL (ref 2.6–18.5)

## 2020-07-07 LAB — PROTEIN ELECTROPHORESIS, SERUM, WITH REFLEX
A/G Ratio: 0.8 (ref 0.7–1.7)
Albumin ELP: 3.2 g/dL (ref 2.9–4.4)
Alpha-1-Globulin: 0.3 g/dL (ref 0.0–0.4)
Alpha-2-Globulin: 0.8 g/dL (ref 0.4–1.0)
Beta Globulin: 1 g/dL (ref 0.7–1.3)
Gamma Globulin: 1.9 g/dL — ABNORMAL HIGH (ref 0.4–1.8)
Globulin, Total: 4 g/dL — ABNORMAL HIGH (ref 2.2–3.9)
Total Protein ELP: 7.2 g/dL (ref 6.0–8.5)

## 2020-07-08 ENCOUNTER — Ambulatory Visit (INDEPENDENT_AMBULATORY_CARE_PROVIDER_SITE_OTHER): Payer: Medicaid Other | Admitting: Podiatry

## 2020-07-08 ENCOUNTER — Other Ambulatory Visit: Payer: Self-pay

## 2020-07-08 DIAGNOSIS — L97514 Non-pressure chronic ulcer of other part of right foot with necrosis of bone: Secondary | ICD-10-CM

## 2020-07-08 DIAGNOSIS — L97521 Non-pressure chronic ulcer of other part of left foot limited to breakdown of skin: Secondary | ICD-10-CM

## 2020-07-08 NOTE — Progress Notes (Signed)
  Subjective:  Patient ID: [name], male    DOB: [birth year],  MRN: [id number]  Chief Complaint  Patient presents with  . Wound Check    2 WEEK fu  Wound Care POV #3 DOS 05/16/2020 & 3/9/20222RT FOOT WOUND DEBRIDMENT & IRRIGATION, POSS SKIN GRAFT SUBSTITIUTE   DOS: 03/27/20 Procedure: TMA with Rotation flap  50 y.o. male presents for wound care. Hx confirmed with patient.  Denies nausea vomiting fevers and chills is applying medihoney.  States that he has a sore on the left foot. Objective:  Physical Exam: Wound Location: right lateral foot Wound Measurement: 3 x 2 Wound Base: Mixed Granular/Fibrotic Peri-wound: Normal Exudate: Large amount Serosanguinous exudate wound without warmth, erythema, signs of acute infection  Epithelialized tissue left after debridement of large callus with skin maceration but without open ulceration  Assessment:   1. 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 Ulcer of left foot, limited to breakdown of skin [facility])    Plan:  Patient was evaluated and treated and all questions answered.  Ulcer right foot, s/p TMA -Wounds debrided bilaterally -Somewhat improving.  Wound continues to be macerated. Dressed with Betadine wet-to-dry bilaterally today -Likely will need repeat debridement at a later date we will further discussed with patient  Return in about 2 weeks (around 07/22/2020) for Wound Care.

## 2020-07-08 NOTE — Patient Instructions (Signed)
We have ordered a vascular test at:  [facility] [address] [place],  [place]  [address] Get Driving Directions Main: [phone]

## 2020-07-09 ENCOUNTER — Telehealth: Payer: Self-pay | Admitting: Physician Assistant

## 2020-07-09 MED ORDER — FOLIC ACID 1 MG PO TABS: 1.0000 mg | ORAL_TABLET | Freq: Every day | ORAL | 3 refills | Status: DC

## 2020-07-09 NOTE — Telephone Encounter (Signed)
I called [name] to review the lab results after consultation on 07/04/2020.  Results indicate that likely cause of leukocytosis is ongoing foot wound.  This was further confirmed with elevated sed rate. In addition, anemia is likely secondary to CKD but there is a component of folate deficiency. I sent a prescription for folic acid 1 mg daily.   Plan to see [name] back in clinic in approximately 3 months to repeat labs. Patient expressed understanding and satisfaction with the plan provided.

## 2020-07-15 ENCOUNTER — Other Ambulatory Visit: Payer: Self-pay

## 2020-07-15 ENCOUNTER — Ambulatory Visit (HOSPITAL_COMMUNITY)
Admission: RE | Admit: 2020-07-15 | Discharge: 2020-07-15 | Disposition: A | Discharge status: Home / Self Care | Payer: Medicaid Other | Source: Ambulatory Visit | Attending: Podiatry | Admitting: Podiatry

## 2020-07-15 DIAGNOSIS — L97514 Non-pressure chronic ulcer of other part of right foot with necrosis of bone: Secondary | ICD-10-CM | POA: Insufficient documentation

## 2020-07-18 ENCOUNTER — Other Ambulatory Visit: Payer: Self-pay | Admitting: Nurse Practitioner

## 2020-07-18 DIAGNOSIS — I10 Essential (primary) hypertension: Secondary | ICD-10-CM

## 2020-07-18 MED ORDER — CLONIDINE HCL 0.1 MG PO TABS: 0.1000 mg | ORAL_TABLET | Freq: Two times a day (BID) | ORAL | 2 refills | Status: DC

## 2020-07-18 NOTE — Telephone Encounter (Signed)
Requested medication (s) are due for refill today: yes  Requested medication (s) are on the active medication list: yes  Last refill: ?  Future visit scheduled: yes  Notes to clinic: historical provider, Jettie Booze, MD    Requested Prescriptions  Pending Prescriptions Disp Refills   cloNIDine (CATAPRES) 0.1 MG tablet 60 tablet 11    Sig: Take 1 tablet (0.1 mg total) by mouth 2 (two) times daily.      Cardiovascular:  Alpha-2 Agonists Failed - 07/18/2020 10:52 AM      Failed - Last BP in normal range    BP Readings from Last 1 Encounters:  07/04/20 (!) 162/93          Passed - Last Heart Rate in normal range    Pulse Readings from Last 1 Encounters:  07/04/20 90          Passed - Valid encounter within last 6 months    Recent Outpatient Visits           3 weeks ago Diabetes mellitus with neurological manifestations, uncontrolled ([place])   [place], Vernia Buff, NP   1 month ago Hospital discharge follow-up   [place], Zelda W, NP   3 months ago Hospital discharge follow-up   [place], [place] W, NP   4 months ago Type 2 diabetes mellitus with diabetic polyneuropathy, with long-term current use of insulin [facility])   [place], Vernia Buff, NP   5 months ago Essential hypertension   [place], RPH-CPP       Future Appointments             In 2 months Gildardo Pounds, NP [place]

## 2020-07-18 NOTE — Telephone Encounter (Signed)
Medication Refill - Medication: cloNIDine (CATAPRES) 0.1 MG tablet   Has the patient contacted their pharmacy? Yes.   (Agent: If no, request that the patient contact the pharmacy for the refill.) (Agent: If yes, when and what did the pharmacy advise?)pcp has to call Rx in   Preferred Pharmacy (with phone number or street name): [facility] ([address]), [place]  [address] [place]) [place] [address]  Phone:  [phone] Fax:  [phone]   Agent: Please be advised that RX refills may take up to 3 business days. We ask that you follow-up with your pharmacy.

## 2020-07-22 ENCOUNTER — Ambulatory Visit: Payer: Self-pay | Admitting: Podiatry

## 2020-07-22 ENCOUNTER — Ambulatory Visit: Payer: Medicaid Other | Admitting: Podiatry

## 2020-07-22 ENCOUNTER — Other Ambulatory Visit: Payer: Self-pay

## 2020-07-22 ENCOUNTER — Ambulatory Visit (INDEPENDENT_AMBULATORY_CARE_PROVIDER_SITE_OTHER): Payer: Medicaid Other

## 2020-07-22 DIAGNOSIS — L97514 Non-pressure chronic ulcer of other part of right foot with necrosis of bone: Secondary | ICD-10-CM | POA: Diagnosis not present

## 2020-07-22 DIAGNOSIS — M8580 Other specified disorders of bone density and structure, unspecified site: Secondary | ICD-10-CM

## 2020-07-22 DIAGNOSIS — Z5189 Encounter for other specified aftercare: Secondary | ICD-10-CM

## 2020-07-22 NOTE — Progress Notes (Signed)
  Subjective:  Patient ID: [name], male    DOB: 03/20/1971,  MRN: [id number]  Chief Complaint  Patient presents with  . Diabetic Ulcer    Right foot necrosis of the bone. Pt states he has been doing well. Last dressing change was 2 days ago.    DOS: 03/27/20 Procedure: TMA with Rotation flap  50 y.o. male presents for wound care. Hx confirmed with patient.  Denies new postop issues Objective:  Physical Exam: Wound Location: right lateral foot Wound Measurement:4x3 distal Wound Base: Mixed Granular/Fibrotic Peri-wound: Normal Exudate: Large amount Serosanguinous exudate wound without warmth, erythema, signs of acute infection Wound appears healed laterally Palpable bone at the distal aspect of the wound likely second metatarsal  Assessment:   1. Visit for wound check   2. 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 Bone erosion    Plan:  Patient was evaluated and treated and all questions answered.  Ulcer right foot, s/p TMA -Still slow to heal but without signs of acute infection.  He does have some exposed bone distally.  X-rays today show some erosion of this bone.  We did discuss that he would benefit from wound debridement closure and possible metatarsal resection.  Patient amenable -Patient has failed all conservative therapy and wishes to proceed with surgical intervention. All risks, benefits, and alternatives discussed with patient. No guarantees given. Consent reviewed and signed by patient. -Planned procedures: right foot -ASA 3 - Patient with moderate systemic disease with functional limitations; Risk factors: DM -Post-op anticoagulation: chemoprophylaxis not indicated   No follow-ups on file.

## 2020-07-22 NOTE — H&P (View-Only) (Signed)
  Subjective:  Patient ID: [name], male    DOB: 03-21-1971,  MRN: [id number]  Chief Complaint  Patient presents with  . Diabetic Ulcer    Right foot necrosis of the bone. Pt states he has been doing well. Last dressing change was 2 days ago.    DOS: 03/27/20 Procedure: TMA with Rotation flap  50 y.o. male presents for wound care. Hx confirmed with patient.  Denies new postop issues Objective:  Physical Exam: Wound Location: right lateral foot Wound Measurement:4x3 distal Wound Base: Mixed Granular/Fibrotic Peri-wound: Normal Exudate: Large amount Serosanguinous exudate wound without warmth, erythema, signs of acute infection Wound appears healed laterally Palpable bone at the distal aspect of the wound likely second metatarsal  Assessment:   1. Visit for wound check   2. 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 Bone erosion    Plan:  Patient was evaluated and treated and all questions answered.  Ulcer right foot, s/p TMA -Still slow to heal but without signs of acute infection.  He does have some exposed bone distally.  X-rays today show some erosion of this bone.  We did discuss that he would benefit from wound debridement closure and possible metatarsal resection.  Patient amenable -Patient has failed all conservative therapy and wishes to proceed with surgical intervention. All risks, benefits, and alternatives discussed with patient. No guarantees given. Consent reviewed and signed by patient. -Planned procedures: right foot -ASA 3 - Patient with moderate systemic disease with functional limitations; Risk factors: DM -Post-op anticoagulation: chemoprophylaxis not indicated   No follow-ups on file.

## 2020-07-23 ENCOUNTER — Telehealth: Payer: Self-pay | Admitting: Nurse Practitioner

## 2020-07-23 DIAGNOSIS — Z794 Long term (current) use of insulin: Secondary | ICD-10-CM

## 2020-07-23 DIAGNOSIS — E1142 Type 2 diabetes mellitus with diabetic polyneuropathy: Secondary | ICD-10-CM

## 2020-07-23 MED ORDER — INSULIN PEN NEEDLE 32G X 4 MM MISC: 1 refills | Status: DC

## 2020-07-23 MED ORDER — LANTUS SOLOSTAR 100 UNIT/ML ~~LOC~~ SOPN: 15.0000 [IU] | PEN_INJECTOR | Freq: Every day | SUBCUTANEOUS | 0 refills | Status: DC

## 2020-07-23 NOTE — Telephone Encounter (Signed)
Rx sent 

## 2020-07-23 NOTE — Addendum Note (Signed)
Addended by: Daisy Blossom, Annie Main L on: 07/23/2020 04:07 PM   Modules accepted: Orders

## 2020-07-23 NOTE — Telephone Encounter (Signed)
[place] with [facility] is calling and the pt has new pharm and needs new rx lantus solostar 15 ml

## 2020-07-25 ENCOUNTER — Other Ambulatory Visit (HOSPITAL_COMMUNITY)
Admission: RE | Admit: 2020-07-25 | Discharge: 2020-07-25 | Disposition: A | Discharge status: Home / Self Care | Payer: Medicaid Other | Source: Ambulatory Visit | Attending: Podiatry | Admitting: Podiatry

## 2020-07-25 ENCOUNTER — Encounter (HOSPITAL_COMMUNITY): Payer: Self-pay

## 2020-07-25 ENCOUNTER — Encounter (HOSPITAL_COMMUNITY)
Admission: RE | Admit: 2020-07-25 | Discharge: 2020-07-25 | Disposition: A | Discharge status: Home / Self Care | Payer: Medicaid Other | Source: Ambulatory Visit | Attending: Podiatry | Admitting: Podiatry

## 2020-07-25 ENCOUNTER — Other Ambulatory Visit: Payer: Self-pay

## 2020-07-25 DIAGNOSIS — Z01812 Encounter for preprocedural laboratory examination: Secondary | ICD-10-CM | POA: Diagnosis not present

## 2020-07-25 DIAGNOSIS — Z20822 Contact with and (suspected) exposure to covid-19: Secondary | ICD-10-CM | POA: Diagnosis not present

## 2020-07-25 HISTORY — DX: Myoneural disorder, unspecified: G70.9

## 2020-07-25 HISTORY — DX: Atherosclerotic heart disease of native coronary artery without angina pectoris: I25.10

## 2020-07-25 LAB — CBC
HCT: 32.3 % — ABNORMAL LOW (ref 39.0–52.0)
Hemoglobin: 9.6 g/dL — ABNORMAL LOW (ref 13.0–17.0)
MCH: 26.7 pg (ref 26.0–34.0)
MCHC: 29.7 g/dL — ABNORMAL LOW (ref 30.0–36.0)
MCV: 89.7 fL (ref 80.0–100.0)
Platelets: 219 10*3/uL (ref 150–400)
RBC: 3.6 MIL/uL — ABNORMAL LOW (ref 4.22–5.81)
RDW: 15.3 % (ref 11.5–15.5)
WBC: 11.3 10*3/uL — ABNORMAL HIGH (ref 4.0–10.5)
nRBC: 0 % (ref 0.0–0.2)

## 2020-07-25 LAB — BASIC METABOLIC PANEL
Anion gap: 7 (ref 5–15)
BUN: 23 mg/dL — ABNORMAL HIGH (ref 6–20)
CO2: 22 mmol/L (ref 22–32)
Calcium: 8.9 mg/dL (ref 8.9–10.3)
Chloride: 108 mmol/L (ref 98–111)
Creatinine, Ser: 2.08 mg/dL — ABNORMAL HIGH (ref 0.61–1.24)
GFR, Estimated: 38 mL/min — ABNORMAL LOW (ref >60)
Glucose, Bld: 307 mg/dL — ABNORMAL HIGH (ref 70–99)
Potassium: 4.7 mmol/L (ref 3.5–5.1)
Sodium: 137 mmol/L (ref 135–145)

## 2020-07-25 LAB — GLUCOSE, CAPILLARY: Glucose-Capillary: 280 mg/dL — ABNORMAL HIGH (ref 70–99)

## 2020-07-25 NOTE — Progress Notes (Addendum)
PCP - lov 06-23-20 epic  [name], NP Cardiologist - Dr. Eulas Post  PPM/ICD -  Device Orders -  Rep Notified -   Chest x-ray - 05-28-20 EKG - 03-06-20 Stress Test - 03-18-20 ECHO - 03-31-20 Cardiac Cath -  HGBA1C--06-23-20 epic 7.0  Sleep Study -  CPAP -   Fasting Blood Sugar - 200 Checks Blood Sugar ___1__ times a day  Blood Thinner Instructions: Aspirin Instructions: 81 mg  ERAS Protcol - PRE-SURGERY Ensure or G2-   COVID TEST- 4-29  Activity--Able to walk a flight of stairs without sob Anesthesia review: HTN,DM, hgb9.6, creatine 2.08  Patient denies shortness of breath, fever, cough and chest pain at PAT appointment   All instructions explained to the patient, with a verbal understanding of the material. Patient agrees to go over the instructions while at home for a better understanding. Patient also instructed to self quarantine after being tested for COVID-19. The opportunity to ask questions was provided.

## 2020-07-25 NOTE — Patient Instructions (Signed)
DUE TO COVID-19 ONLY ONE VISITOR IS ALLOWED TO COME WITH YOU AND STAY IN THE WAITING ROOM ONLY DURING PRE OP AND PROCEDURE DAY OF SURGERY.   TWO VISITOR  MAY VISIT WITH YOU AFTER SURGERY IN YOUR PRIVATE ROOM DURING VISITING HOURS ONLY!  YOU NEED TO HAVE A COVID 19 TEST ON__4-29_____ @_______ , THIS TEST MUST BE DONE BEFORE SURGERY,  COVID TESTING SITE [address] [place] [address], IT IS ON THE RIGHT GOING OUT [address] APPROXIMATELY  2 MINUTES PAST [employer] ON THE RIGHT. ONCE YOUR COVID TEST IS COMPLETED,  PLEASE BEGIN THE QUARANTINE INSTRUCTIONS AS OUTLINED IN YOUR HANDOUT.                [name]  07/25/2020   Your procedure is scheduled on: 07-29-20   Report to [facility] Main  Entrance   Report to admitting at       3:00 PM     Call this number if you have problems the morning of surgery [phone]    Remember: Do not eat food After Midnight.   You may have clear liquids until2:00 pm then nothing by mouth    CLEAR LIQUID DIET   Foods Allowed                                                                                Foods Excluded  Black Coffee and tea, regular and decaf                             liquids that you cannot  Plain Jell-O any favor except red or purple                                           see through such as: Fruit ices (not with fruit pulp)                                                       milk, soups, orange juice  Iced Popsicles                                                    All solid food Carbonated beverages, regular and diet                                    Cranberry, grape and apple juices Sports drinks like Gatorade Lightly seasoned clear broth or consume(fat free) Sugar, honey syrup   _____________________________________________________________________    BRUSH YOUR TEETH MORNING OF SURGERY AND RINSE YOUR MOUTH OUT, NO CHEWING GUM CANDY OR MINTS.     Take these medicines the  morning of surgery with A SIP OF WATER: gabapentin, clonodine,carvedilol,amlodipine  Take 1/2 of normal insulin dose the night before surgery DO NOT TAKE ANY DIABETIC MEDICATIONS DAY OF YOUR SURGERY                               You may not have any metal on your body including hair pins and              piercings  Do not wear jewelry, , lotions, powders or perfumes, deodorant              Men may shave face and neck.   Do not bring valuables to the hospital. [place].  Contacts, dentures or bridgework may not be worn into surgery.       Patients discharged the day of surgery will not be allowed to drive home. IF YOU ARE HAVING SURGERY AND GOING HOME THE SAME DAY, YOU MUST HAVE AN ADULT TO DRIVE YOU HOME AND BE WITH YOU FOR 24 HOURS. YOU MAY GO HOME BY TAXI OR UBER OR ORTHERWISE, BUT AN ADULT MUST ACCOMPANY YOU HOME AND STAY WITH YOU FOR 24 HOURS.  Name and phone number of your driver:  Special Instructions: N/A              Please read over the following fact sheets you were given: _____________________________________________________________________             [facility] - Preparing for Surgery Before surgery, you can play an important role.  Because skin is not sterile, your skin needs to be as free of germs as possible.  You can reduce the number of germs on your skin by washing with CHG (chlorahexidine gluconate) soap before surgery.  CHG is an antiseptic cleaner which kills germs and bonds with the skin to continue killing germs even after washing. Please DO NOT use if you have an allergy to CHG or antibacterial soaps.  If your skin becomes reddened/irritated stop using the CHG and inform your nurse when you arrive at Short Stay. Do not shave (including legs and underarms) for at least 48 hours prior to the first CHG shower.  You may shave your face/neck. Please follow these instructions carefully:  1.  Shower with CHG Soap the  night before surgery and the  morning of Surgery.  2.  If you choose to wash your hair, wash your hair first as usual with your  normal  shampoo.  3.  After you shampoo, rinse your hair and body thoroughly to remove the  shampoo.                           4.  Use CHG as you would any other liquid soap.  You can apply chg directly  to the skin and wash                       Gently with a scrungie or clean washcloth.  5.  Apply the CHG Soap to your body ONLY FROM THE NECK DOWN.   Do not use on face/ open                           Wound or open sores.  Avoid contact with eyes, ears mouth and genitals (private parts).                       Wash face,  Genitals (private parts) with your normal soap.             6.  Wash thoroughly, paying special attention to the area where your surgery  will be performed.  7.  Thoroughly rinse your body with warm water from the neck down.  8.  DO NOT shower/wash with your normal soap after using and rinsing off  the CHG Soap.                9.  Pat yourself dry with a clean towel.            10.  Wear clean pajamas.            11.  Place clean sheets on your bed the night of your first shower and do not  sleep with pets. Day of Surgery : Do not apply any lotions/deodorants the morning of surgery.  Please wear clean clothes to the hospital/surgery center.  FAILURE TO FOLLOW THESE INSTRUCTIONS MAY RESULT IN THE CANCELLATION OF YOUR SURGERY PATIENT SIGNATURE_________________________________  NURSE SIGNATURE__________________________________  ________________________________________________________________________   [name]  An incentive spirometer is a tool that can help keep your lungs clear and active. This tool measures how well you are filling your lungs with each breath. Taking long deep breaths may help reverse or decrease the chance of developing breathing (pulmonary) problems (especially infection) following:  A long period of time when you  are unable to move or be active. BEFORE THE PROCEDURE   If the spirometer includes an indicator to show your best effort, your nurse or respiratory therapist will set it to a desired goal.  If possible, sit up straight or lean slightly forward. Try not to slouch.  Hold the incentive spirometer in an upright position. INSTRUCTIONS FOR USE  1. Sit on the edge of your bed if possible, or sit up as far as you can in bed or on a chair. 2. Hold the incentive spirometer in an upright position. 3. Breathe out normally. 4. Place the mouthpiece in your mouth and seal your lips tightly around it. 5. Breathe in slowly and as deeply as possible, raising the piston or the ball toward the top of the column. 6. Hold your breath for 3-5 seconds or for as long as possible. Allow the piston or ball to fall to the bottom of the column. 7. Remove the mouthpiece from your mouth and breathe out normally. 8. Rest for a few seconds and repeat Steps 1 through 7 at least 10 times every 1-2 hours when you are awake. Take your time and take a few normal breaths between deep breaths. 9. The spirometer may include an indicator to show your best effort. Use the indicator as a goal to work toward during each repetition. 10. After each set of 10 deep breaths, practice coughing to be sure your lungs are clear. If you have an incision (the cut made at the time of surgery), support your incision when coughing by placing a pillow or rolled up towels firmly against it. Once you are able to get out of bed, walk around indoors and cough well. You may stop using the incentive spirometer when instructed by your caregiver.  RISKS AND COMPLICATIONS  Take your time so you do not get dizzy or light-headed.  If you are in pain, you may need to take or ask for pain medication before doing incentive spirometry. It is harder to take a deep breath if you are having pain. AFTER USE  Rest and breathe slowly and easily.  It can be helpful to  keep track of a log of your progress. Your caregiver can provide you with a simple table to help with this. If you are using the spirometer at home, follow these instructions: [place] IF:   You are having difficultly using the spirometer.  You have trouble using the spirometer as often as instructed.  Your pain medication is not giving enough relief while using the spirometer.  You develop fever of 100.5 F (38.1 C) or higher. SEEK IMMEDIATE MEDICAL CARE IF:   You cough up bloody sputum that had not been present before.  You develop fever of 102 F (38.9 C) or greater.  You develop worsening pain at or near the incision site. MAKE SURE YOU:   Understand these instructions.  Will watch your condition.  Will get help right away if you are not doing well or get worse. Document Released: 07/26/2006 Document Revised: 06/07/2011 Document Reviewed: 09/26/2006 [facility] Patient Information 2014 [place], [place].   ________________________________________________________________________

## 2020-07-26 LAB — SARS CORONAVIRUS 2 (TAT 6-24 HRS): SARS Coronavirus 2: NEGATIVE

## 2020-07-28 ENCOUNTER — Telehealth: Payer: Self-pay | Admitting: Nurse Practitioner

## 2020-07-28 NOTE — Telephone Encounter (Signed)
Home Health Verbal Orders - Caller/Agency: Judson Roch from [place] Number: [phone] Requesting Social Work Frequency: 1x 1

## 2020-07-29 ENCOUNTER — Encounter (HOSPITAL_COMMUNITY): Payer: Self-pay | Admitting: Podiatry

## 2020-07-29 ENCOUNTER — Ambulatory Visit (HOSPITAL_COMMUNITY)
Admission: RE | Admit: 2020-07-29 | Discharge: 2020-07-29 | Disposition: A | Discharge status: Home / Self Care | Payer: Medicaid Other | Attending: Podiatry | Admitting: Podiatry

## 2020-07-29 ENCOUNTER — Ambulatory Visit (HOSPITAL_COMMUNITY): Payer: Medicaid Other | Admitting: Certified Registered"

## 2020-07-29 ENCOUNTER — Encounter (HOSPITAL_COMMUNITY)
Admission: RE | Disposition: A | Discharge status: Home / Self Care | Payer: Self-pay | Source: Home / Self Care | Attending: Podiatry

## 2020-07-29 ENCOUNTER — Ambulatory Visit (HOSPITAL_COMMUNITY): Payer: Medicaid Other | Admitting: Physician Assistant

## 2020-07-29 DIAGNOSIS — Z794 Long term (current) use of insulin: Secondary | ICD-10-CM | POA: Insufficient documentation

## 2020-07-29 DIAGNOSIS — L97514 Non-pressure chronic ulcer of other part of right foot with necrosis of bone: Secondary | ICD-10-CM | POA: Insufficient documentation

## 2020-07-29 DIAGNOSIS — B9689 Other specified bacterial agents as the cause of diseases classified elsewhere: Secondary | ICD-10-CM | POA: Insufficient documentation

## 2020-07-29 DIAGNOSIS — E11621 Type 2 diabetes mellitus with foot ulcer: Secondary | ICD-10-CM | POA: Insufficient documentation

## 2020-07-29 DIAGNOSIS — I10 Essential (primary) hypertension: Secondary | ICD-10-CM | POA: Insufficient documentation

## 2020-07-29 DIAGNOSIS — Z1619 Resistance to other specified beta lactam antibiotics: Secondary | ICD-10-CM | POA: Insufficient documentation

## 2020-07-29 DIAGNOSIS — F17200 Nicotine dependence, unspecified, uncomplicated: Secondary | ICD-10-CM | POA: Insufficient documentation

## 2020-07-29 HISTORY — PX: IRRIGATION AND DEBRIDEMENT FOOT: SHX6602

## 2020-07-29 LAB — GLUCOSE, CAPILLARY
Glucose-Capillary: 155 mg/dL — ABNORMAL HIGH (ref 70–99)
Glucose-Capillary: 111 mg/dL — ABNORMAL HIGH (ref 70–99)

## 2020-07-29 SURGERY — IRRIGATION AND DEBRIDEMENT FOOT
Anesthesia: Monitor Anesthesia Care | Laterality: Right

## 2020-07-29 MED ORDER — GLYCOPYRROLATE PF 0.2 MG/ML IJ SOSY
PREFILLED_SYRINGE | INTRAMUSCULAR | Status: AC
  Filled 2020-07-29: qty 1

## 2020-07-29 MED ORDER — ONDANSETRON HCL 4 MG/2ML IJ SOLN
INTRAMUSCULAR | Status: AC
  Filled 2020-07-29: qty 2

## 2020-07-29 MED ORDER — OXYCODONE HCL 5 MG/5ML PO SOLN
5.0000 mg | Freq: Once | ORAL | Status: DC | PRN
Start: 2020-07-29 — End: 2020-07-30

## 2020-07-29 MED ORDER — HYDROMORPHONE HCL 1 MG/ML IJ SOLN: 0.2500 mg | INTRAMUSCULAR | Status: DC | PRN

## 2020-07-29 MED ORDER — PHENYLEPHRINE HCL (PRESSORS) 10 MG/ML IV SOLN
INTRAVENOUS | Status: AC
  Filled 2020-07-29: qty 1

## 2020-07-29 MED ORDER — LACTATED RINGERS IV SOLN
INTRAVENOUS | Status: DC
Start: 2020-07-29 — End: 2020-07-30

## 2020-07-29 MED ORDER — DEXMEDETOMIDINE (PRECEDEX) IN NS 20 MCG/5ML (4 MCG/ML) IV SYRINGE
PREFILLED_SYRINGE | INTRAVENOUS | Status: DC | PRN
  Administered 2020-07-29: 8 ug via INTRAVENOUS

## 2020-07-29 MED ORDER — LIDOCAINE 2% (20 MG/ML) 5 ML SYRINGE
INTRAMUSCULAR | Status: AC
  Filled 2020-07-29: qty 5

## 2020-07-29 MED ORDER — VANCOMYCIN HCL 1000 MG IV SOLR
INTRAVENOUS | Status: AC
  Filled 2020-07-29: qty 1000

## 2020-07-29 MED ORDER — OXYCODONE HCL 5 MG PO TABS
5.0000 mg | ORAL_TABLET | Freq: Once | ORAL | Status: DC | PRN
Start: 2020-07-29 — End: 2020-07-30

## 2020-07-29 MED ORDER — VANCOMYCIN HCL 1000 MG IV SOLR
INTRAVENOUS | Status: DC | PRN
  Administered 2020-07-29: 1000 mg via TOPICAL

## 2020-07-29 MED ORDER — DEXMEDETOMIDINE (PRECEDEX) IN NS 20 MCG/5ML (4 MCG/ML) IV SYRINGE
PREFILLED_SYRINGE | INTRAVENOUS | Status: AC
  Filled 2020-07-29: qty 5

## 2020-07-29 MED ORDER — HYDROCODONE-ACETAMINOPHEN 5-325 MG PO TABS: 1.0000 | ORAL_TABLET | ORAL | 0 refills | Status: DC | PRN

## 2020-07-29 MED ORDER — PHENYLEPHRINE 40 MCG/ML (10ML) SYRINGE FOR IV PUSH (FOR BLOOD PRESSURE SUPPORT)
PREFILLED_SYRINGE | INTRAVENOUS | Status: AC
  Filled 2020-07-29: qty 10

## 2020-07-29 MED ORDER — LIDOCAINE HCL (CARDIAC) PF 100 MG/5ML IV SOSY
PREFILLED_SYRINGE | INTRAVENOUS | Status: DC | PRN
  Administered 2020-07-29: 50 mg via INTRAVENOUS

## 2020-07-29 MED ORDER — ONDANSETRON HCL 4 MG/2ML IJ SOLN
INTRAMUSCULAR | Status: DC | PRN
  Administered 2020-07-29: 4 mg via INTRAVENOUS

## 2020-07-29 MED ORDER — FENTANYL CITRATE (PF) 250 MCG/5ML IJ SOLN
INTRAMUSCULAR | Status: AC
  Filled 2020-07-29: qty 5

## 2020-07-29 MED ORDER — BUPIVACAINE HCL (PF) 0.5 % IJ SOLN
INTRAMUSCULAR | Status: DC | PRN
  Administered 2020-07-29: 10 mL

## 2020-07-29 MED ORDER — ORAL CARE MOUTH RINSE: 15.0000 mL | Freq: Once | OROMUCOSAL | Status: AC

## 2020-07-29 MED ORDER — MIDAZOLAM HCL 2 MG/2ML IJ SOLN
INTRAMUSCULAR | Status: AC
  Filled 2020-07-29: qty 2

## 2020-07-29 MED ORDER — CHLORHEXIDINE GLUCONATE 0.12 % MT SOLN
15.0000 mL | Freq: Once | OROMUCOSAL | Status: AC
  Administered 2020-07-29: 15 mL via OROMUCOSAL

## 2020-07-29 MED ORDER — MIDAZOLAM HCL 5 MG/5ML IJ SOLN
INTRAMUSCULAR | Status: DC | PRN
  Administered 2020-07-29: 2 mg via INTRAVENOUS

## 2020-07-29 MED ORDER — PROPOFOL 10 MG/ML IV BOLUS
INTRAVENOUS | Status: DC | PRN
  Administered 2020-07-29: 40 mg via INTRAVENOUS

## 2020-07-29 MED ORDER — PROPOFOL 500 MG/50ML IV EMUL
INTRAVENOUS | Status: DC | PRN
  Administered 2020-07-29: 100 ug/kg/min via INTRAVENOUS

## 2020-07-29 MED ORDER — GLYCOPYRROLATE 0.2 MG/ML IJ SOLN
INTRAMUSCULAR | Status: DC | PRN
  Administered 2020-07-29: .1 mg via INTRAVENOUS

## 2020-07-29 MED ORDER — PHENYLEPHRINE HCL-NACL 10-0.9 MG/250ML-% IV SOLN
INTRAVENOUS | Status: DC | PRN
  Administered 2020-07-29: 10 ug/min via INTRAVENOUS

## 2020-07-29 MED ORDER — BUPIVACAINE HCL (PF) 0.5 % IJ SOLN
INTRAMUSCULAR | Status: AC
  Filled 2020-07-29: qty 30

## 2020-07-29 MED ORDER — FENTANYL CITRATE (PF) 100 MCG/2ML IJ SOLN
INTRAMUSCULAR | Status: DC | PRN
  Administered 2020-07-29: 25 ug via INTRAVENOUS
  Administered 2020-07-29: 50 ug via INTRAVENOUS

## 2020-07-29 MED ORDER — PROMETHAZINE HCL 25 MG/ML IJ SOLN: 6.2500 mg | INTRAMUSCULAR | Status: DC | PRN

## 2020-07-29 MED ORDER — PROPOFOL 10 MG/ML IV BOLUS
INTRAVENOUS | Status: AC
  Filled 2020-07-29: qty 20

## 2020-07-29 MED ORDER — CLINDAMYCIN PHOSPHATE 600 MG/50ML IV SOLN
INTRAVENOUS | Status: DC | PRN
  Administered 2020-07-29: 600 mg via INTRAVENOUS

## 2020-07-29 MED ORDER — CLINDAMYCIN PHOSPHATE 900 MG/50ML IV SOLN
INTRAVENOUS | Status: AC
  Filled 2020-07-29: qty 50

## 2020-07-29 SURGICAL SUPPLY — 44 items
BLADE OSCILLATING/SAGITTAL (BLADE) ×2
BLADE SURG 15 STRL LF DISP TIS (BLADE) ×1 IMPLANT
BLADE SURG 15 STRL SS (BLADE) ×2
BLADE SW THK.38XMED LNG THN (BLADE) IMPLANT
BNDG ELASTIC 4X5.8 VLCR STR LF (GAUZE/BANDAGES/DRESSINGS) ×2 IMPLANT
BNDG GAUZE ELAST 4 BULKY (GAUZE/BANDAGES/DRESSINGS) ×2 IMPLANT
CNTNR URN SCR LID CUP LEK RST (MISCELLANEOUS) ×1 IMPLANT
CONT SPEC 4OZ STRL OR WHT (MISCELLANEOUS) ×2
COVER BACK TABLE 60X90IN (DRAPES) ×2 IMPLANT
DRAPE 3/4 80X56 (DRAPES) ×2 IMPLANT
DRAPE EXTREMITY T 121X128X90 (DISPOSABLE) ×2 IMPLANT
DRAPE SHEET LG 3/4 BI-LAMINATE (DRAPES) ×2 IMPLANT
DRAPE U-SHAPE 47X51 STRL (DRAPES) ×2 IMPLANT
DRSG PAD ABDOMINAL 8X10 ST (GAUZE/BANDAGES/DRESSINGS) ×1 IMPLANT
ELECT REM PT RETURN 15FT ADLT (MISCELLANEOUS) ×2 IMPLANT
GAUZE SPONGE 4X4 12PLY STRL (GAUZE/BANDAGES/DRESSINGS) ×2 IMPLANT
GAUZE XEROFORM 1X8 LF (GAUZE/BANDAGES/DRESSINGS) ×2 IMPLANT
GLOVE SRG 8 PF TXTR STRL LF DI (GLOVE) ×1 IMPLANT
GLOVE SURG ENC MOIS LTX SZ7.5 (GLOVE) ×2 IMPLANT
GLOVE SURG LTX SZ8 (GLOVE) ×2 IMPLANT
GLOVE SURG UNDER POLY LF SZ8 (GLOVE) ×2
GOWN STRL REUS W/ TWL XL LVL3 (GOWN DISPOSABLE) ×1 IMPLANT
GOWN STRL REUS W/TWL XL LVL3 (GOWN DISPOSABLE) ×2
KIT BASIN OR (CUSTOM PROCEDURE TRAY) ×2 IMPLANT
KIT TURNOVER KIT A (KITS) ×2 IMPLANT
MANIFOLD NEPTUNE II (INSTRUMENTS) ×2 IMPLANT
MATRIX MICRONIZED WOUND 500 (Tissue) ×1 IMPLANT
MATRIX PURAPLY AM 5X5 COLLAGEN (Tissue) IMPLANT
NDL HYPO 25X1 1.5 SAFETY (NEEDLE) ×1 IMPLANT
NEEDLE HYPO 25X1 1.5 SAFETY (NEEDLE) ×2 IMPLANT
NS IRRIG 1000ML POUR BTL (IV SOLUTION) ×2 IMPLANT
PURAPLY AM 5X5 COLLAGEN (Tissue) ×2 IMPLANT
SET IRRIG Y TYPE TUR BLADDER L (SET/KITS/TRAYS/PACK) ×2 IMPLANT
STAPLER VISISTAT 35W (STAPLE) ×2 IMPLANT
SUCTION FRAZIER HANDLE 10FR (MISCELLANEOUS) ×2
SUCTION TUBE FRAZIER 10FR DISP (MISCELLANEOUS) ×1 IMPLANT
SUT ETHILON 3 0 PS 1 (SUTURE) ×2 IMPLANT
SUT ETHILON 4 0 PS 2 18 (SUTURE) ×2 IMPLANT
SUT MNCRL AB 4-0 PS2 18 (SUTURE) ×2 IMPLANT
SUT VIC AB 2-0 SH 27 (SUTURE) ×2
SUT VIC AB 2-0 SH 27XBRD (SUTURE) ×1 IMPLANT
SYR CONTROL 10ML LL (SYRINGE) ×2 IMPLANT
TUBE IRRIGATION SET MISONIX (TUBING) ×2 IMPLANT
UNDERPAD 30X36 HEAVY ABSORB (UNDERPADS AND DIAPERS) ×2 IMPLANT

## 2020-07-29 NOTE — Op Note (Signed)
  Patient Name: [name] DOB: [birth year]  MRN: [id number]   Date of Surgery: 07/29/2020  Surgeon: Dr. 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 Pathology/Specimens: ID Type Source Tests Collected by Time Destination  A : BONE OF 2ND METATARSEL Tissue Bone FUNGUS CULTURE WITH STAIN, AEROBIC/ANAEROBIC CULTURE W GRAM STAIN (SURGICAL/DEEP WOUND), ANAEROBIC CULTURE W GRAM STAIN [name], DPM 07/29/2020 1819    Anesthesia: MAC/local Hemostasis: * No tourniquets in log * Estimated Blood Loss: 50 ml Materials:  Implant Name Type Inv. Item Serial No. Manufacturer Lot No. LRB No. Used Action  MATRIX MICRONIZED WOUND 500 - [id number] Tissue MATRIX MICRONIZED WOUND 500  [employer] [id number] Right 1 Implanted  PURAPLY NWOUND MATRIX     [employer] [id number] Right 1 Implanted   Medications: 1g Vancomycin powder. Complications: none  Indications for Procedure:  This is a 50 y.o. 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  The wound recently had signs of bone erosion concerning for osteomyelitis.  Discussed would benefit from debridement of the wound with removal of exposed bone.  All risk-benefit alternatives of surgery were discussed with the patient no guarantees were given.   Procedure in Detail: Patient was identified in pre-operative holding area. Formal consent was signed and the right lower extremity was marked. Patient was brought back to the operating room. Anesthesia was induced. The extremity was prepped and draped in the usual sterile fashion. Timeout was taken to confirm patient name, laterality, and procedure prior to incision.    Attention was then directed to the right foot. There was a wound measuring 1 x 3.5 x 1 with exposed bone.  The wound was incised at each margin for better exposure.  A sample of bone was taken for culture. The wound was then thoroughly irrigated with 1L of NS.   The wound was then thoroughly debrided with a rongeur including excisional debridement of the second and third metatarsal bones.  The bones were debrided to from viable bone.  The bones were then transected and smoothed with a oscillating saw and rasp.  The final wound measured 1 x 4 x 1.   PuraPly graft was packed into the wound.  The wound was then loosely closed with 3-0 nylon and skin staples.  The mixture of Osiris matrix powder and vancomycin powder was applied topically to the incision.  The foot was then dressed with xeroform, 4x4, kerlix, ABD, ACE bandage. Patient tolerated the procedure well.   Disposition: Following a period of post-operative monitoring, patient will be transferred home.

## 2020-07-29 NOTE — Interval H&P Note (Signed)
History and Physical Interval Note:  07/29/2020 5:12 PM  [name]  has presented today for surgery, with the diagnosis of Osteomyelitis.  The various methods of treatment have been discussed with the patient and family. After consideration of risks, benefits and other options for treatment, the patient has consented to  Procedure(s): IRRIGATION AND DEBRIDEMENT FOOT; METATARSAL RESECTION AS INDICATED RIGHT FOOT (Right) as a surgical intervention.  The patient's history has been reviewed, patient examined, no change in status, stable for surgery.  I have reviewed the patient's chart and labs.  Questions were answered to the patient's satisfaction.     [name]

## 2020-07-29 NOTE — Progress Notes (Signed)
Orthopedic Tech Progress Note Patient Details:  [name] [birth year] [id number]  Ortho Devices Type of Ortho Device: Postop shoe/boot Ortho Device/Splint Location: Right Foot Ortho Device/Splint Interventions: Application   Post Interventions Patient Tolerated: Well   [name] 07/29/2020, 7:51 PM

## 2020-07-29 NOTE — Anesthesia Preprocedure Evaluation (Signed)
Anesthesia Evaluation  Patient identified by MRN, date of birth, ID band Patient awake    Reviewed: Allergy & Precautions, NPO status , Patient's Chart, lab work & pertinent test results, reviewed documented beta blocker date and time   Airway Mallampati: II  TM Distance: >3 FB     Dental  (+) Dental Advisory Given   Pulmonary neg pulmonary ROS, Current Smoker and Patient abstained from smoking.,    breath sounds clear to auscultation       Cardiovascular hypertension, Pt. on medications and Pt. on home beta blockers negative cardio ROS   Rhythm:Regular Rate:Normal     Neuro/Psych negative neurological ROS  negative psych ROS   GI/Hepatic negative GI ROS, Neg liver ROS,   Endo/Other  negative endocrine ROSdiabetes, Type 2, Insulin Dependent  Renal/GU CRFnegative Renal ROS  negative genitourinary   Musculoskeletal negative musculoskeletal ROS (+)   Abdominal   Peds negative pediatric ROS (+)  Hematology negative hematology ROS (+) anemia ,   Anesthesia Other Findings   Reproductive/Obstetrics negative OB ROS                             Anesthesia Physical  Anesthesia Plan  ASA: III  Anesthesia Plan: MAC   Post-op Pain Management:    Induction:   PONV Risk Score and Plan: 0 and Propofol infusion, Ondansetron and Treatment may vary due to age or medical condition  Airway Management Planned: Natural Airway and Simple Face Mask  Additional Equipment: None  Intra-op Plan:   Post-operative Plan:   Informed Consent: I have reviewed the patients History and Physical, chart, labs and discussed the procedure including the risks, benefits and alternatives for the proposed anesthesia with the patient or authorized representative who has indicated his/her understanding and acceptance.     Dental advisory given  Plan Discussed with: CRNA  Anesthesia Plan Comments:          Anesthesia Quick Evaluation

## 2020-07-29 NOTE — Anesthesia Postprocedure Evaluation (Signed)
Anesthesia Post Note  Patient: [name]  Procedure(s) Performed: IRRIGATION AND DEBRIDEMENT FOOT; METATARSAL RESECTION AS INDICATED RIGHT FOOT (Right )     Patient location during evaluation: PACU Anesthesia Type: MAC Level of consciousness: awake and alert Pain management: pain level controlled Vital Signs Assessment: post-procedure vital signs reviewed and stable Respiratory status: spontaneous breathing, nonlabored ventilation and respiratory function stable Cardiovascular status: blood pressure returned to baseline and stable Postop Assessment: no apparent nausea or vomiting Anesthetic complications: no   No complications documented.  Last Vitals:  Vitals:   07/29/20 1830 07/29/20 1845  BP: 96/71 119/78  Pulse: 72 74  Resp: 12 18  Temp: 36.5 C   SpO2: 100% 100%    Last Pain:  Vitals:   07/29/20 1845  PainSc: Asleep                 Lynda Rainwater

## 2020-07-29 NOTE — Telephone Encounter (Signed)
Verbal orders were given for patient. 

## 2020-07-29 NOTE — Transfer of Care (Signed)
Immediate Anesthesia Transfer of Care Note  Patient: [name]  Procedure(s) Performed: IRRIGATION AND DEBRIDEMENT FOOT; METATARSAL RESECTION AS INDICATED RIGHT FOOT (Right )  Patient Location: PACU  Anesthesia Type:MAC  Level of Consciousness: drowsy  Airway & Oxygen Therapy: Patient Spontanous Breathing and Patient connected to face mask oxygen  Post-op Assessment: Report given to RN and Post -op Vital signs reviewed and stable  Post vital signs: Reviewed and stable  Last Vitals:  Vitals Value Taken Time  BP 96/71 07/29/20 1830  Temp 36.5 C 07/29/20 1830  Pulse 72 07/29/20 1839  Resp 13 07/29/20 1839  SpO2 100 % 07/29/20 1839  Vitals shown include unvalidated device data.  Last Pain:  Vitals:   07/29/20 1830  PainSc: 0-No pain         Complications: No complications documented.

## 2020-07-29 NOTE — Discharge Instructions (Signed)
  After Surgery Instructions   1) If you are recuperating from surgery anywhere other than home, please be sure to leave us the number where you can be reached.  2) Go directly home and rest.  3) Keep the operated foot(feet) elevated six inches above the hip when sitting or lying down. This will help control swelling and pain.  4) Support the elevated foot and leg with pillows. DO NOT PLACE PILLOWS UNDER THE KNEE.  5) DO NOT REMOVE or get your bandages WET, unless you were given different instructions by your doctor to do so. This increases the risk of infection.  6) Wear your surgical shoe or surgical boot at all times when you are up on your feet.  7) A limited amount of pain and swelling may occur. The skin may take on a bruised appearance. DO NOT BE ALARMED, THIS IS NORMAL.  8) For slight pain and swelling, apply an ice pack directly over the bandages for 15 minutes only out of each hour of the day. Continue until seen in the office for your first post op visit. DO NOT APPLY ANY FORM OF HEAT TO THE AREA.  9) Have prescriptions filled immediately and take as directed.  10) Drink lots of liquids, water and juice to stay hydrated.  11) CALL IMMEDIATELY IF:  *Bleeding continues until the following day of surgery  *Pain increases and/or does not respond to medication  *Bandages or cast appears to tight  *If your bandage gets wet  *Trip, fall or stump your surgical foot  *If your temperature goes above 101  *If you have ANY questions at all  12) You are expected to be weightbearing after your surgery.   If you need to reach the nurse for any reason, please call: [place]: [phone] [place]: [phone] [place]: [phone]  

## 2020-07-30 ENCOUNTER — Telehealth: Payer: Self-pay | Admitting: Nurse Practitioner

## 2020-07-30 ENCOUNTER — Other Ambulatory Visit: Payer: Self-pay | Admitting: *Deleted

## 2020-07-30 ENCOUNTER — Telehealth: Payer: Self-pay | Admitting: *Deleted

## 2020-07-30 NOTE — Telephone Encounter (Signed)
Called to request nursing treatment for 1xwk 9.  Any questions, please call at [phone]

## 2020-07-30 NOTE — Telephone Encounter (Signed)
Returned [place] call and provider verbal orders

## 2020-07-30 NOTE — Telephone Encounter (Signed)
[name] w/ [facility] is calling orders of the right foot 1 day post surgery. Should the surgical dressings be left in place until his f/u on May 10 th.  She would also like to make physician aware that patient has a golf ball size callus on bottom of left foot.

## 2020-07-31 ENCOUNTER — Telehealth: Payer: Self-pay | Admitting: Podiatry

## 2020-07-31 ENCOUNTER — Telehealth: Payer: Self-pay | Admitting: Nurse Practitioner

## 2020-07-31 ENCOUNTER — Encounter (HOSPITAL_COMMUNITY): Payer: Self-pay | Admitting: Podiatry

## 2020-07-31 NOTE — Telephone Encounter (Signed)
Returned call to [place] left a detailed vm giving verbal orders

## 2020-07-31 NOTE — Telephone Encounter (Signed)
Pt's physical therapist called stating she needs clarification on weight bearing precautions. Please advise.

## 2020-07-31 NOTE — Telephone Encounter (Signed)
Cecile Hearing PT with [facility] is calling and would like PT orders 1x1, 2x2, 1x5

## 2020-07-31 NOTE — Telephone Encounter (Signed)
Returned call to patient's nurse and gave information per [name], verbalized understanding ,wanted the doctor to know that patient has been putting weight  on the surgery foot.

## 2020-07-31 NOTE — Telephone Encounter (Signed)
Aware of callus left. Please leave dressing intact until 1st POV

## 2020-07-31 NOTE — Telephone Encounter (Signed)
Can you please advise he is heel weightbearing as tolerated in his surgical shoe

## 2020-08-04 ENCOUNTER — Other Ambulatory Visit: Payer: Self-pay | Admitting: Podiatry

## 2020-08-04 DIAGNOSIS — M86171 Other acute osteomyelitis, right ankle and foot: Secondary | ICD-10-CM

## 2020-08-04 DIAGNOSIS — L97514 Non-pressure chronic ulcer of other part of right foot with necrosis of bone: Secondary | ICD-10-CM

## 2020-08-04 LAB — AEROBIC/ANAEROBIC CULTURE W GRAM STAIN (SURGICAL/DEEP WOUND)

## 2020-08-05 ENCOUNTER — Other Ambulatory Visit: Payer: Self-pay

## 2020-08-05 ENCOUNTER — Ambulatory Visit (INDEPENDENT_AMBULATORY_CARE_PROVIDER_SITE_OTHER): Payer: Medicaid Other | Admitting: Podiatry

## 2020-08-05 DIAGNOSIS — L97514 Non-pressure chronic ulcer of other part of right foot with necrosis of bone: Secondary | ICD-10-CM

## 2020-08-05 DIAGNOSIS — M86171 Other acute osteomyelitis, right ankle and foot: Secondary | ICD-10-CM | POA: Diagnosis not present

## 2020-08-05 NOTE — Progress Notes (Signed)
  Subjective:  Patient ID: [name], male    DOB: 01-12-1971,  MRN: [id number]  Chief Complaint  Patient presents with  . Routine Post Op    POV #1 DOS 07/29/2020 RT FOOT WOUND DEBRIDEMENT W/METATARSAL RESECTINO AS INDICATED. Doing well, denies concerns/f/c/n/v. Manageable pain lvls.   . Callouses    Thick/painful callous on bottom on left foot.   . Nail Problem    Thick/painful toenails. Desires trim.     DOS: 07/29/20 Procedures:             1) Debridement of wound down to and including bone.  50 y.o. male presents with the above complaint. History confirmed with patient.   Objective:  Physical Exam: tenderness at the surgical site, local edema noted and calf supple, nontender. Incision: slow healing, slight clear drainage present no warmth, no erythema, no purulence  Assessment:   1. Other acute osteomyelitis of right foot ([name])   2. 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  Post-operative State -Pending referral to ID for abx -Dressed with betadine WTD right -Left foot wound debrided, dressed with betadine and mepilex.  No follow-ups on file.

## 2020-08-06 ENCOUNTER — Telehealth: Payer: Self-pay | Admitting: Physician Assistant

## 2020-08-06 NOTE — Telephone Encounter (Signed)
Called and spoke with patient to confirm 7/13 appt. Patient expressed he is aware

## 2020-08-07 ENCOUNTER — Other Ambulatory Visit: Payer: Self-pay

## 2020-08-07 ENCOUNTER — Telehealth: Payer: Self-pay | Admitting: Podiatry

## 2020-08-07 ENCOUNTER — Ambulatory Visit (INDEPENDENT_AMBULATORY_CARE_PROVIDER_SITE_OTHER): Payer: Medicaid Other | Admitting: Internal Medicine

## 2020-08-07 ENCOUNTER — Encounter: Payer: Self-pay | Admitting: Internal Medicine

## 2020-08-07 ENCOUNTER — Other Ambulatory Visit (HOSPITAL_COMMUNITY): Payer: Self-pay

## 2020-08-07 VITALS — BP 163/88 | HR 85 | Temp 98.0°F | Ht 69.0 in | Wt 187.0 lb

## 2020-08-07 DIAGNOSIS — F17200 Nicotine dependence, unspecified, uncomplicated: Secondary | ICD-10-CM

## 2020-08-07 DIAGNOSIS — M869 Osteomyelitis, unspecified: Secondary | ICD-10-CM | POA: Diagnosis not present

## 2020-08-07 DIAGNOSIS — E11628 Type 2 diabetes mellitus with other skin complications: Secondary | ICD-10-CM

## 2020-08-07 DIAGNOSIS — L089 Local infection of the skin and subcutaneous tissue, unspecified: Secondary | ICD-10-CM | POA: Diagnosis not present

## 2020-08-07 DIAGNOSIS — E1065 Type 1 diabetes mellitus with hyperglycemia: Secondary | ICD-10-CM

## 2020-08-07 MED ORDER — DOXYCYCLINE HYCLATE 100 MG PO TABS: 100.0000 mg | ORAL_TABLET | Freq: Two times a day (BID) | ORAL | 1 refills | Status: AC

## 2020-08-07 MED ORDER — LEVOFLOXACIN 750 MG PO TABS: 750.0000 mg | ORAL_TABLET | ORAL | 1 refills | Status: AC
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
## 2020-08-07 NOTE — Patient Instructions (Addendum)
We'll get blood tests today and also try to schedule a vascular study for your feet  Will also arrange antibiotics for you 1) dalbavancin will be 2 infusion outpatient at the infusion center 2) take oral pills with doxycycline/levofloxacin for 6 weeks   Follow up with me in 4 weeks  Avoid dairy, polyvalent cations like calcium, iron, magnesium, while you are on doxycycline/levofloxacin  Doxy is 100 twice a day Levo is 750 once a day   Please stop smoking if you could, to improve healing of the foot

## 2020-08-07 NOTE — Telephone Encounter (Signed)
Can you give verbal orders for medihoney to open areas of incision every 2-3 times per week. Cover with dry sterile dressing

## 2020-08-07 NOTE — Telephone Encounter (Signed)
Called and left a message for [name] and relayed the message per [name]. [name]

## 2020-08-07 NOTE — Telephone Encounter (Signed)
Home health nurse is requesting wound care orders for the patient; needs the order for home health records. If you are unable to contact, it will be ok for the orders to be given verbally over voicemail.

## 2020-08-08 ENCOUNTER — Ambulatory Visit (HOSPITAL_COMMUNITY)
Admission: RE | Admit: 2020-08-08 | Discharge: 2020-08-08 | Disposition: A | Discharge status: Home / Self Care | Payer: Medicaid Other | Source: Ambulatory Visit | Attending: Internal Medicine | Admitting: Internal Medicine

## 2020-08-08 ENCOUNTER — Encounter: Payer: Self-pay | Admitting: Internal Medicine

## 2020-08-08 DIAGNOSIS — F17200 Nicotine dependence, unspecified, uncomplicated: Secondary | ICD-10-CM | POA: Diagnosis present

## 2020-08-08 DIAGNOSIS — E11628 Type 2 diabetes mellitus with other skin complications: Secondary | ICD-10-CM | POA: Diagnosis present

## 2020-08-08 DIAGNOSIS — L089 Local infection of the skin and subcutaneous tissue, unspecified: Secondary | ICD-10-CM

## 2020-08-08 DIAGNOSIS — M869 Osteomyelitis, unspecified: Secondary | ICD-10-CM | POA: Diagnosis present

## 2020-08-08 DIAGNOSIS — E1065 Type 1 diabetes mellitus with hyperglycemia: Secondary | ICD-10-CM | POA: Diagnosis present

## 2020-08-08 LAB — COMPREHENSIVE METABOLIC PANEL
AG Ratio: 0.9 (calc) — ABNORMAL LOW (ref 1.0–2.5)
ALT: 21 U/L (ref 9–46)
AST: 12 U/L (ref 10–35)
Albumin: 3.4 g/dL — ABNORMAL LOW (ref 3.6–5.1)
Alkaline phosphatase (APISO): 129 U/L (ref 35–144)
BUN/Creatinine Ratio: 10 (calc) (ref 6–22)
BUN: 23 mg/dL (ref 7–25)
CO2: 24 mmol/L (ref 20–32)
Calcium: 8.7 mg/dL (ref 8.6–10.3)
Chloride: 106 mmol/L (ref 98–110)
Creat: 2.37 mg/dL — ABNORMAL HIGH (ref 0.70–1.33)
Globulin: 3.9 g/dL (calc) — ABNORMAL HIGH (ref 1.9–3.7)
Glucose, Bld: 218 mg/dL — ABNORMAL HIGH (ref 65–99)
Potassium: 4.5 mmol/L (ref 3.5–5.3)
Sodium: 136 mmol/L (ref 135–146)
Total Bilirubin: 0.3 mg/dL (ref 0.2–1.2)
Total Protein: 7.3 g/dL (ref 6.1–8.1)

## 2020-08-08 LAB — CBC WITH DIFFERENTIAL/PLATELET
Absolute Monocytes: 725 cells/uL (ref 200–950)
Basophils Absolute: 59 cells/uL (ref 0–200)
Basophils Relative: 0.4 %
Eosinophils Absolute: 503 cells/uL — ABNORMAL HIGH (ref 15–500)
Eosinophils Relative: 3.4 %
HCT: 27.9 % — ABNORMAL LOW (ref 38.5–50.0)
Hemoglobin: 8.7 g/dL — ABNORMAL LOW (ref 13.2–17.1)
Lymphs Abs: 3448 cells/uL (ref 850–3900)
MCH: 26.3 pg — ABNORMAL LOW (ref 27.0–33.0)
MCHC: 31.2 g/dL — ABNORMAL LOW (ref 32.0–36.0)
MCV: 84.3 fL (ref 80.0–100.0)
MPV: 10.4 fL (ref 7.5–12.5)
Monocytes Relative: 4.9 %
Neutro Abs: 10064 cells/uL — ABNORMAL HIGH (ref 1500–7800)
Neutrophils Relative %: 68 %
Platelets: 339 10*3/uL (ref 140–400)
RBC: 3.31 10*6/uL — ABNORMAL LOW (ref 4.20–5.80)
RDW: 14.1 % (ref 11.0–15.0)
Total Lymphocyte: 23.3 %
WBC: 14.8 10*3/uL — ABNORMAL HIGH (ref 3.8–10.8)

## 2020-08-08 LAB — C-REACTIVE PROTEIN: CRP: 43.9 mg/L — ABNORMAL HIGH (ref <8.0)

## 2020-08-08 NOTE — Progress Notes (Signed)
ABI has been completed.   Preliminary results in CV Proc.   Abram Sander 08/08/2020 4:16 PM

## 2020-08-08 NOTE — Progress Notes (Signed)
Thanks. Good to know.  [name]

## 2020-08-08 NOTE — Progress Notes (Signed)
ABI scheduled. Short Stay orders faxed to Short Stay, awaiting confirmed scheduling.  [name]

## 2020-08-11 ENCOUNTER — Telehealth: Payer: Self-pay

## 2020-08-11 ENCOUNTER — Telehealth: Payer: Self-pay | Admitting: Podiatry

## 2020-08-11 NOTE — Telephone Encounter (Signed)
I called patient to let him know he is appointment at the [facility] for IV Dalvancin.Patient scheduled for 08/13/20 @ 10:00 am. Patient aware of appointment and location. Patient verbalized understanding.  [name]

## 2020-08-11 NOTE — Telephone Encounter (Signed)
Pt called stating he needed a new rx to take to [facility] for diabetic shoes and/or foot exam. Pt is scheduled to see [name] 5.24 and I have included in the note to make sure they give rx to pt for diabetic shoes and inserts.

## 2020-08-11 NOTE — Telephone Encounter (Signed)
I have to see if he's ready to be casted there based on his wound progress so I won't write an rx for now

## 2020-08-12 ENCOUNTER — Other Ambulatory Visit (HOSPITAL_COMMUNITY): Payer: Self-pay | Admitting: *Deleted

## 2020-08-13 ENCOUNTER — Other Ambulatory Visit: Payer: Self-pay

## 2020-08-13 ENCOUNTER — Ambulatory Visit (HOSPITAL_COMMUNITY)
Admission: RE | Admit: 2020-08-13 | Discharge: 2020-08-13 | Disposition: A | Discharge status: Home / Self Care | Payer: Medicaid Other | Source: Ambulatory Visit | Attending: Internal Medicine | Admitting: Internal Medicine

## 2020-08-13 DIAGNOSIS — E11628 Type 2 diabetes mellitus with other skin complications: Secondary | ICD-10-CM | POA: Insufficient documentation

## 2020-08-13 MED ORDER — DEXTROSE 5 % IV SOLN
1500.0000 mg | INTRAVENOUS | Status: DC
  Administered 2020-08-13: 1500 mg via INTRAVENOUS
  Filled 2020-08-13: qty 75

## 2020-08-19 ENCOUNTER — Ambulatory Visit (INDEPENDENT_AMBULATORY_CARE_PROVIDER_SITE_OTHER): Payer: Medicaid Other | Admitting: Podiatry

## 2020-08-19 ENCOUNTER — Other Ambulatory Visit: Payer: Self-pay

## 2020-08-19 DIAGNOSIS — L97514 Non-pressure chronic ulcer of other part of right foot with necrosis of bone: Secondary | ICD-10-CM

## 2020-08-19 MED ORDER — SANTYL 250 UNIT/GM EX OINT
TOPICAL_OINTMENT | CUTANEOUS | 0 refills | Status: DC
Start: 2020-08-19 — End: 2020-09-05
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
## 2020-08-20 ENCOUNTER — Ambulatory Visit (HOSPITAL_COMMUNITY)
Admission: RE | Admit: 2020-08-20 | Discharge: 2020-08-20 | Disposition: A | Discharge status: Home / Self Care | Payer: Medicaid Other | Source: Ambulatory Visit | Attending: Internal Medicine | Admitting: Internal Medicine

## 2020-08-20 DIAGNOSIS — E11628 Type 2 diabetes mellitus with other skin complications: Secondary | ICD-10-CM | POA: Insufficient documentation

## 2020-08-20 MED ORDER — DEXTROSE 5 % IV SOLN
1500.0000 mg | INTRAVENOUS | Status: DC
  Administered 2020-08-20: 1500 mg via INTRAVENOUS
  Filled 2020-08-20: qty 75

## 2020-08-21 ENCOUNTER — Telehealth: Payer: Self-pay | Admitting: Podiatry

## 2020-08-21 NOTE — Telephone Encounter (Signed)
Please advise patient there is no asimilar alternative but I would advise calling the pharmacy to see if there is any copay assistance available. If not we can hold off for now with that medication

## 2020-08-21 NOTE — Telephone Encounter (Signed)
Patient called and stated that the ointment that was prescribed is not covered by his insurance and that he would like an alternative. Its cost about $300.00 for the medication.

## 2020-08-26 ENCOUNTER — Encounter: Payer: Self-pay | Admitting: Registered"

## 2020-08-26 ENCOUNTER — Other Ambulatory Visit: Payer: Self-pay

## 2020-08-26 ENCOUNTER — Encounter: Payer: Medicaid Other | Attending: Physician Assistant | Admitting: Registered"

## 2020-08-26 DIAGNOSIS — E119 Type 2 diabetes mellitus without complications: Secondary | ICD-10-CM | POA: Insufficient documentation

## 2020-08-26 NOTE — Patient Instructions (Addendum)
-   Contact primary care provider for assistance with [facility]. Ask for in-person appointments because that is your preference.   - Add ginger or other herbs and spices to microwaveable vegetables. Have vegetables with lunch and dinner.   - Aim to have at least 2-3 meals a day.   - Balance meals with 1/2 plate of non-starchy vegetables + 1/4 plate of lean protein + 1/4 plate of carbohydrates. See page 21 in book.   - Check out [employer]Pages.[employer]ee.

## 2020-08-26 NOTE — Progress Notes (Signed)
Diabetes Self-Management Education  Visit Type:  Follow-up  Appt. Start Time: 11:07 Appt. End Time: 12:30  08/26/2020  [name], identified by name and date of birth, is a 50 y.o. male with a diagnosis of Diabetes:  .   ASSESSMENT  States he doesn't eat vegetables, other than green vegetables. States he is scared to eat vegetables because he doesn't know how his body will tolerate them after 50 years of not eating vegetables. States right now he is a meat and starch guy but willing to try new vegetables. States he does not have an appetite; eats once a day.   States he does not take insulin; stopped taking about 5 months ago. Reports his Freestyle Libre device fell off and he does not know how to put it back on. States he has no discussed this with his provider. States he checks BS 2x/day: FBS (185) and before bed (185-200).  Reports he lives with girlfriend who has cerebral palsy. States he does all of the cooking. States he has to stand up and cook but has toe amputations and ulcers on the bottom of his foot making it challenging to cook. States he will be attending smoking cessation class in June 2022 for the first time. Pt states he has a lot going on and he is stressed.   States he has physical therapy once a week.  Pt expectations: wants help with eating vegetables   There were no vitals taken for this visit. There is no height or weight on file to calculate BMI.    Diabetes Self-Management Education - 08/26/20 1130      Health Coping   How would you rate your overall health? Poor      Psychosocial Assessment   Patient Belief/Attitude about Diabetes Afraid    Self-care barriers None    Self-management support None    Special Needs None    Preferred Learning Style No preference indicated    Learning Readiness Contemplating      Complications   Last HgB A1C per patient/outside source 7.9 %    How often do you check your blood sugar? 1-2 times/day    Fasting  Blood glucose range (mg/dL) 180-200    Postprandial Blood glucose range (mg/dL) 180-200    Number of hypoglycemic episodes per month 0    Number of hyperglycemic episodes per week 0    Have you had a dilated eye exam in the past 12 months? No    Have you had a dental exam in the past 12 months? No    Are you checking your feet? Yes    How many days per week are you checking your feet? 2      Dietary Intake   Breakfast skipped    Lunch 12:30 pm - [employer] - steak and gravy + green beans + macaroni and cheese + roll + sweet tea    Dinner skipped    Beverage(s) sweet tea, water (2-3*16 oz; 32-48 oz)      Exercise   Exercise Type ADL's    How many days per week to you exercise? 7    How many minutes per day do you exercise? 30    Total minutes per week of exercise 210      Patient Education   Previous Diabetes Education Yes (please comment)    Disease state  Definition of diabetes, type 1 and 2, and the diagnosis of diabetes;Factors that contribute to the development of diabetes  Nutrition management  Role of diet in the treatment of diabetes and the relationship between the three main macronutrients and blood glucose level;Food label reading, portion sizes and measuring food.;Effects of alcohol on blood glucose and safety factors with consumption of alcohol.;Information on hints to eating out and maintain blood glucose control.    Physical activity and exercise  Role of exercise on diabetes management, blood pressure control and cardiac health.    Medications Other (comment)   discussed importance of taking medication and updating his healthcare team about his regimen   Monitoring Interpreting lab values - A1C, lipid, urine microalbumina.;Identified appropriate SMBG and/or A1C goals.    Acute complications Taught treatment of hypoglycemia - the 15 rule.;Discussed and identified patients' treatment of hyperglycemia.    Chronic complications Identified and discussed with patient  current chronic  complications;Lipid levels, blood glucose control and heart disease;Applicable immunizations;Reviewed with patient heart disease, higher risk of, and prevention    Psychosocial adjustment Role of stress on diabetes;Worked with patient to identify barriers to care and solutions;Helped patient identify a support system for diabetes management;Identified and addressed patients feelings and concerns about diabetes    Personal strategies to promote health Review risk of smoking and offered smoking cessation      Individualized Goals (developed by patient)   Nutrition Follow meal plan discussed    Medications take my medication as prescribed    Monitoring  test my blood glucose as discussed    Reducing Risk examine blood glucose patterns;treat hypoglycemia with 15 grams of carbs if blood glucose less than 70mg /dL;stop smoking    Health Coping ask for help with (comment)   Freestyle Libre; connect with mental health counselor for stress management     Post-Education Assessment   Patient understands the diabetes disease and treatment process. Demonstrates understanding / competency    Patient understands incorporating nutritional management into lifestyle. Needs Instruction    Patient undertands incorporating physical activity into lifestyle. Needs Review    Patient understands using medications safely. Demonstrates understanding / competency    Patient understands monitoring blood glucose, interpreting and using results Demonstrates understanding / competency    Patient understands prevention, detection, and treatment of acute complications. Demonstrates understanding / competency    Patient understands prevention, detection, and treatment of chronic complications. Needs Instruction    Patient understands how to develop strategies to address psychosocial issues. Demonstrates understanding / competency    Patient understands how to develop strategies to promote health/change behavior. Needs Review       Outcomes   Program Status Not Completed      Subsequent Visit   Since your last visit have you continued or begun to take your medications as prescribed? No    Since your last visit have you had your blood pressure checked? Yes    Is your most recent blood pressure lower, unchanged, or higher since your last visit? Unchanged    Since your last visit, are you checking your blood glucose at least once a day? Yes           Learning Objective:  Patient will have a greater understanding of diabetes self-management. Patient education plan is to attend individual and/or group sessions per assessed needs and concerns.   Plan:   Patient Instructions  - Contact primary care provider for assistance with [facility]. Ask for in-person appointments because that is your preference.   - Add ginger or other herbs and spices to microwaveable vegetables. Have vegetables with lunch and dinner.   -  Aim to have at least 2-3 meals a day.   - Balance meals with 1/2 plate of non-starchy vegetables + 1/4 plate of lean protein + 1/4 plate of carbohydrates. See page 21 in book.   - Check out [employer].        Expected Outcomes:  Demonstrated interest in learning. Expect positive outcomes  Education material provided: ADA - How to Thrive: A Guide for Your Journey with Diabetes, Support group flyer and Diabetes Resources  If problems or questions, patient to contact team via:  Phone and Email  Future DSME appointment: - 4-6 wks

## 2020-08-28 ENCOUNTER — Telehealth: Payer: Self-pay | Admitting: Podiatry

## 2020-08-28 LAB — FUNGUS CULTURE WITH STAIN

## 2020-08-28 LAB — FUNGUS CULTURE RESULT

## 2020-08-28 LAB — FUNGAL ORGANISM REFLEX

## 2020-08-28 NOTE — Telephone Encounter (Signed)
Katie with [facility] called our office in regards to [place] wanting  clarification on his wound care orders of his right foot. She also wants to know how often should this  be done.   Call back - [phone]

## 2020-09-01 ENCOUNTER — Other Ambulatory Visit: Payer: Self-pay | Admitting: Family Medicine

## 2020-09-01 ENCOUNTER — Telehealth: Payer: Self-pay | Admitting: *Deleted

## 2020-09-01 DIAGNOSIS — I10 Essential (primary) hypertension: Secondary | ICD-10-CM

## 2020-09-01 NOTE — Telephone Encounter (Signed)
[name] w/ [facility] [phone]) is calling for updated wound care orders,frequency is needed for patient. Please advise.

## 2020-09-01 NOTE — Telephone Encounter (Signed)
Requested Prescriptions  Pending Prescriptions Disp Refills  . cloNIDine (CATAPRES) 0.1 MG tablet [Pharmacy Med Name: cloNIDine HCl 0.1 MG Oral Tablet] 60 tablet 0    Sig: Take 1 tablet by mouth twice daily     Cardiovascular:  Alpha-2 Agonists Passed - 09/01/2020  2:13 PM      Passed - Last BP in normal range    BP Readings from Last 1 Encounters:  08/20/20 122/75         Passed - Last Heart Rate in normal range    Pulse Readings from Last 1 Encounters:  08/20/20 74         Passed - Valid encounter within last 6 months    Recent Outpatient Visits          2 months ago Diabetes mellitus with neurological manifestations, uncontrolled ([place])   [place], Vernia Buff, NP   3 months ago Hospital discharge follow-up   [place] Gildardo Pounds, NP   4 months ago Hospital discharge follow-up   [place], [place] W, NP   6 months ago Type 2 diabetes mellitus with diabetic polyneuropathy, with long-term current use of insulin [facility])   [place], Vernia Buff, NP   7 months ago Essential hypertension   [place], RPH-CPP      Future Appointments            In 3 days Vu, Rockey Situ, MD [facility]   In 3 weeks Gildardo Pounds, NP [place]

## 2020-09-02 ENCOUNTER — Ambulatory Visit (INDEPENDENT_AMBULATORY_CARE_PROVIDER_SITE_OTHER): Payer: Medicaid Other | Admitting: Podiatry

## 2020-09-02 ENCOUNTER — Other Ambulatory Visit: Payer: Self-pay

## 2020-09-02 DIAGNOSIS — L97521 Non-pressure chronic ulcer of other part of left foot limited to breakdown of skin: Secondary | ICD-10-CM | POA: Diagnosis not present

## 2020-09-02 DIAGNOSIS — L97514 Non-pressure chronic ulcer of other part of right foot with necrosis of bone: Secondary | ICD-10-CM

## 2020-09-02 DIAGNOSIS — M86171 Other acute osteomyelitis, right ankle and foot: Secondary | ICD-10-CM

## 2020-09-03 ENCOUNTER — Ambulatory Visit
Admission: EM | Admit: 2020-09-03 | Discharge: 2020-09-03 | Disposition: A | Discharge status: Home / Self Care | Payer: Medicaid Other | Attending: Student | Admitting: Student

## 2020-09-03 ENCOUNTER — Encounter: Payer: Self-pay | Admitting: Emergency Medicine

## 2020-09-03 ENCOUNTER — Telehealth: Payer: Self-pay | Admitting: Podiatry

## 2020-09-03 ENCOUNTER — Other Ambulatory Visit: Payer: Self-pay

## 2020-09-03 DIAGNOSIS — Z794 Long term (current) use of insulin: Secondary | ICD-10-CM | POA: Diagnosis not present

## 2020-09-03 DIAGNOSIS — N1832 Chronic kidney disease, stage 3b: Secondary | ICD-10-CM | POA: Diagnosis not present

## 2020-09-03 DIAGNOSIS — M25512 Pain in left shoulder: Secondary | ICD-10-CM | POA: Diagnosis not present

## 2020-09-03 DIAGNOSIS — E1142 Type 2 diabetes mellitus with diabetic polyneuropathy: Secondary | ICD-10-CM | POA: Diagnosis not present

## 2020-09-03 MED ORDER — TIZANIDINE HCL 2 MG PO CAPS: 2.0000 mg | ORAL_CAPSULE | Freq: Three times a day (TID) | ORAL | 0 refills | Status: DC

## 2020-09-03 NOTE — Telephone Encounter (Signed)
Patient was seen in office 09/02/20, hospitalized 09/03/20.

## 2020-09-03 NOTE — Discharge Instructions (Addendum)
-  Start the muscle relaxer-Zanaflex (tizanidine), up to 3 times daily for muscle spasms and pain.  This can make you drowsy, so take at bedtime or when you do not need to drive or operate machinery. -Avoid ibuprofen due to the kidney disease.  -Continue monitoring your sugars at home -Ask your primary care for referral to orthopedist.

## 2020-09-03 NOTE — ED Triage Notes (Signed)
Pt has a history of rotator cuff tear, since February with pain off and on.
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
## 2020-09-03 NOTE — Telephone Encounter (Signed)
Patient was seen yesterday and did not received the new ointment for his foot at his pharmacy. Please seen as soon as you can.

## 2020-09-04 ENCOUNTER — Ambulatory Visit (INDEPENDENT_AMBULATORY_CARE_PROVIDER_SITE_OTHER): Payer: Medicaid Other | Admitting: Internal Medicine

## 2020-09-04 ENCOUNTER — Encounter: Payer: Self-pay | Admitting: Internal Medicine

## 2020-09-04 ENCOUNTER — Other Ambulatory Visit: Payer: Self-pay

## 2020-09-04 VITALS — BP 159/93 | HR 87 | Resp 16 | Ht 69.0 in | Wt 188.4 lb

## 2020-09-04 DIAGNOSIS — E11628 Type 2 diabetes mellitus with other skin complications: Secondary | ICD-10-CM

## 2020-09-04 DIAGNOSIS — L089 Local infection of the skin and subcutaneous tissue, unspecified: Secondary | ICD-10-CM

## 2020-09-04 DIAGNOSIS — M869 Osteomyelitis, unspecified: Secondary | ICD-10-CM | POA: Diagnosis not present
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
## 2020-09-04 NOTE — Patient Instructions (Signed)
Please pickup another refill of doxycycline and levofloxacin and finish that   Will get blood tests today   I will send dr Hardie Pulley a message regarding exposed bone still and needing coverage(graft)   See me in around 6 weeks  Follow up with podiatry as discussed with them

## 2020-09-05 ENCOUNTER — Other Ambulatory Visit: Payer: Self-pay

## 2020-09-05 ENCOUNTER — Telehealth: Payer: Self-pay | Admitting: *Deleted

## 2020-09-05 ENCOUNTER — Encounter (HOSPITAL_BASED_OUTPATIENT_CLINIC_OR_DEPARTMENT_OTHER): Payer: Medicaid Other | Attending: Internal Medicine | Admitting: Internal Medicine

## 2020-09-05 DIAGNOSIS — M86671 Other chronic osteomyelitis, right ankle and foot: Secondary | ICD-10-CM | POA: Diagnosis not present

## 2020-09-05 DIAGNOSIS — L97514 Non-pressure chronic ulcer of other part of right foot with necrosis of bone: Secondary | ICD-10-CM | POA: Diagnosis not present

## 2020-09-05 DIAGNOSIS — N183 Chronic kidney disease, stage 3 unspecified: Secondary | ICD-10-CM | POA: Diagnosis not present

## 2020-09-05 DIAGNOSIS — I129 Hypertensive chronic kidney disease with stage 1 through stage 4 chronic kidney disease, or unspecified chronic kidney disease: Secondary | ICD-10-CM | POA: Insufficient documentation

## 2020-09-05 DIAGNOSIS — E1151 Type 2 diabetes mellitus with diabetic peripheral angiopathy without gangrene: Secondary | ICD-10-CM | POA: Insufficient documentation

## 2020-09-05 DIAGNOSIS — E11621 Type 2 diabetes mellitus with foot ulcer: Secondary | ICD-10-CM | POA: Diagnosis not present

## 2020-09-05 DIAGNOSIS — E1122 Type 2 diabetes mellitus with diabetic chronic kidney disease: Secondary | ICD-10-CM | POA: Insufficient documentation

## 2020-09-05 DIAGNOSIS — E1140 Type 2 diabetes mellitus with diabetic neuropathy, unspecified: Secondary | ICD-10-CM | POA: Diagnosis not present

## 2020-09-05 LAB — CBC WITH DIFFERENTIAL/PLATELET
Absolute Monocytes: 673 cells/uL (ref 200–950)
Basophils Absolute: 35 cells/uL (ref 0–200)
Basophils Relative: 0.3 %
Eosinophils Absolute: 354 cells/uL (ref 15–500)
Eosinophils Relative: 3 %
HCT: 30.2 % — ABNORMAL LOW (ref 38.5–50.0)
Hemoglobin: 9.3 g/dL — ABNORMAL LOW (ref 13.2–17.1)
Lymphs Abs: 3339 cells/uL (ref 850–3900)
MCH: 26.2 pg — ABNORMAL LOW (ref 27.0–33.0)
MCHC: 30.8 g/dL — ABNORMAL LOW (ref 32.0–36.0)
MCV: 85.1 fL (ref 80.0–100.0)
MPV: 11.5 fL (ref 7.5–12.5)
Monocytes Relative: 5.7 %
Neutro Abs: 7399 cells/uL (ref 1500–7800)
Neutrophils Relative %: 62.7 %
Platelets: 247 10*3/uL (ref 140–400)
RBC: 3.55 10*6/uL — ABNORMAL LOW (ref 4.20–5.80)
RDW: 14.5 % (ref 11.0–15.0)
Total Lymphocyte: 28.3 %
WBC: 11.8 10*3/uL — ABNORMAL HIGH (ref 3.8–10.8)

## 2020-09-05 LAB — COMPLETE METABOLIC PANEL WITH GFR
AG Ratio: 1 (calc) (ref 1.0–2.5)
ALT: 17 U/L (ref 9–46)
AST: 12 U/L (ref 10–35)
Albumin: 3.5 g/dL — ABNORMAL LOW (ref 3.6–5.1)
Alkaline phosphatase (APISO): 145 U/L — ABNORMAL HIGH (ref 35–144)
BUN/Creatinine Ratio: 11 (calc) (ref 6–22)
BUN: 28 mg/dL — ABNORMAL HIGH (ref 7–25)
CO2: 23 mmol/L (ref 20–32)
Calcium: 8.9 mg/dL (ref 8.6–10.3)
Chloride: 102 mmol/L (ref 98–110)
Creat: 2.5 mg/dL — ABNORMAL HIGH (ref 0.70–1.33)
GFR, Est African American: 33 mL/min/{1.73_m2} — ABNORMAL LOW (ref >=60)
GFR, Est Non African American: 29 mL/min/{1.73_m2} — ABNORMAL LOW (ref >=60)
Globulin: 3.5 g/dL (calc) (ref 1.9–3.7)
Glucose, Bld: 448 mg/dL — ABNORMAL HIGH (ref 65–99)
Potassium: 4.2 mmol/L (ref 3.5–5.3)
Sodium: 132 mmol/L — ABNORMAL LOW (ref 135–146)
Total Bilirubin: 0.3 mg/dL (ref 0.2–1.2)
Total Protein: 7 g/dL (ref 6.1–8.1)

## 2020-09-05 LAB — C-REACTIVE PROTEIN: CRP: 57 mg/L — ABNORMAL HIGH (ref <8.0)

## 2020-09-05 MED ORDER — SANTYL 250 UNIT/GM EX OINT: TOPICAL_OINTMENT | CUTANEOUS | 0 refills | Status: DC

## 2020-09-05 NOTE — Progress Notes (Signed)
JUEL, BELLEROSE ([id number]) Visit Report for 09/05/2020 Chief Complaint Document Details Patient Name: Date of Service: Babb RD, [name] 09/05/2020 12:30 PM Medical Record Number: [id number] Patient Account Number: [id number] Date of Birth/Sex: Treating RN: 09-28-1970 (50 y.o. Male) Lorrin Jackson Primary Care Provider: [name] Other Clinician: Referring Provider: Treating Provider/Extender: Tomasa Blase, [name] in Treatment: 0 Information Obtained from: Patient Chief Complaint 09/05/2020; patient is here for review of the wound extensively on a right TMA amputation site Electronic Signature(s) Signed: 09/05/2020 5:16:47 PM By: [name] MD Entered By: [name] on 09/05/2020 14:26:55 -------------------------------------------------------------------------------- Debridement Details Patient Name: Date of Service: [name], [place] 09/05/2020 12:30 PM Medical Record Number: [id number] Patient Account Number: [id number] Date of Birth/Sex: Treating RN: [birth year] (50 y.o. Male) Lorrin Jackson Primary Care Provider: [name] Other Clinician: Referring Provider: Treating Provider/Extender: Lenetta Quaker in Treatment: 0 Debridement Performed for Assessment: Wound #1 Right Amputation Site - Transmetatarsal Performed By: Physician Ricard Dillon., MD Debridement Type: Debridement Severity of Tissue Pre Debridement: Bone involvement without necrosis Level of Consciousness (Pre-procedure): Awake and Alert Pre-procedure Verification/Time Out Yes - 14:00 Taken: Start Time: 14:01 Pain Control: Lidocaine 4% T opical Solution T Area Debrided (L x W): otal 2.2 (cm) x 0.7 (cm) = 1.54 (cm) Tissue and other material debrided: Slough, Subcutaneous, Slough Level: Skin/Subcutaneous Tissue Debridement Description: Excisional Instrument: Curette Bleeding: Minimum Hemostasis Achieved: Pressure End Time: 14:05 Response to  Treatment: Procedure was tolerated well Level of Consciousness (Post- Awake and Alert procedure): Post Debridement Measurements of Total Wound Length: (cm) 2.2 Width: (cm) 0.7 Depth: (cm) 0.8 Volume: (cm) 0.968 Character of Wound/Ulcer Post Debridement: Stable Severity of Tissue Post Debridement: Bone involvement without necrosis Post Procedure Diagnosis Same as Pre-procedure Electronic Signature(s) Signed: 09/05/2020 5:10:19 PM By: Lorrin Jackson Signed: 09/05/2020 5:16:47 PM By: [name] MD Entered By: [name] on 09/05/2020 14:24:50 -------------------------------------------------------------------------------- HPI Details Patient Name: Date of Service: [name], [name] 09/05/2020 12:30 PM Medical Record Number: [id number] Patient Account Number: [id number] Date of Birth/Sex: Treating RN: December 14, 1970 (50 y.o. Male) Lorrin Jackson Primary Care Provider: [name] Other Clinician: Referring Provider: Treating Provider/Extender: Lenetta Quaker in Treatment: 0 History of Present Illness HPI Description: ADMISSION 09/05/2020 This is a 50 year old man who has type 2 diabetes. Essentially the problem began admitted to [facility] health in December with infection gas gangrene. Cultures ultimately grew staph lugdunensis. 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 March Rummage. Shortly thereafter he developed a nonhealing wound with wound dehiscence noted on 05/09/2020. He had a wound VAC I think for a period of time after the surgery but it did not help. He has undergone an IandD and skin graft on the right foot on 05/16/2020. He underwent a further IandD on 06/04/2020 not debrided to bone. 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 Culture of the metatarsal grew Enterobacter cloacae I. He has been followed by Dr. [name] of infectious disease.  Currently on Levaquin and doxycycline I think at about the 4-week of 6 mark. At the last visit with Dr. March Rummage he was supposed to have use Santyl although I do not think he has it and he has been using I think a Betadine wet-to-dry. He is referred here by Dr. [name] for our review who saw him yesterday. I cannot see that he has had imaging studies of the right  foot. No MRI Past medical history includes type 2 diabetes, in 2021 and ulcer of the left foot that ultimately healed with a wound VAC, stage III chronic kidney disease, first toe amputations bilaterally, hypertension, still a 1/2 pack/day smoker. The patient had ABIs on 08/09/2018 which was 1.00 on the right and 0.96 on the left both areas had triphasic waveforms. Not felt to have an arterial issue. Electronic Signature(s) Signed: 09/05/2020 5:16:47 PM By: [name] MD Entered By: [name] on 09/05/2020 14:31:16 -------------------------------------------------------------------------------- Physical Exam Details Patient Name: Date of Service: [name], [name] 09/05/2020 12:30 PM Medical Record Number: [id number] Patient Account Number: [id number] Date of Birth/Sex: Treating RN: July 18, 1970 (50 y.o. Male) Lorrin Jackson Primary Care Provider: [name] Other Clinician: Referring Provider: Treating Provider/Extender: Lenetta Quaker in Treatment: 0 Constitutional Patient is hypertensive.. Pulse regular and within target range for patient.Marland Kitchen Respirations regular, non-labored and within target range.. Temperature is normal and within the target range for the patient.Marland Kitchen Appears in no distress. Respiratory work of breathing is normal. Cardiovascular Pedal pulses needle pulses are palpable on the right. Notes Wound exam; the patient has a deep wound on the transmetatarsal amputation site at the tip. This is more superficial laterally. He had 2 sutures over the top of the wound which really were not doing  any good at all I remove these he has sutures laterally I did not touch. The wound probes to bone medially the rest of this has a granulated surface which is reason for cautious optimism. I used a #5 curette to clean off the surface of the granulation. This bleeds reasonably easily. The wound circumference has thick callused skin. I did not touch this but if we are successful in getting this to epithelialize something will no doubt have to be done about this. Electronic Signature(s) Signed: 09/05/2020 5:16:47 PM By: [name] MD Entered By: [name] on 09/05/2020 14:33:38 -------------------------------------------------------------------------------- Physician Orders Details Patient Name: Date of Service: [name], [place] 09/05/2020 12:30 PM Medical Record Number: [id number] Patient Account Number: [id number] Date of Birth/Sex: Treating RN: 1971/02/08 (50 y.o. Male) Lorrin Jackson Primary Care Provider: [name] Other Clinician: Referring Provider: Treating Provider/Extender: Lenetta Quaker in Treatment: 0 Verbal / Phone Orders: No Diagnosis Coding Follow-up Appointments ppointment in 1 week. - with Dr. Dellia Nims Return A Bathing/ Shower/ Hygiene May shower and wash wound with soap and water. - on days you change dressing Edema Control - Lymphedema / SCD / Other Avoid standing for long periods of time. Exercise regularly Off-Loading Open toe surgical shoe to: - Wear at all times except driving. Additional Orders / Instructions Follow Agar dmit to [facility] for wound care. May utilize formulary equivalent dressing for wound treatment orders unless otherwise specified. - [facility]: 1-2x week for wound assessment and dressing changes. New wound care orders this week; continue Home Health for wound care. May utilize formulary equivalent dressing for wound treatment orders unless otherwise specified. Wound  Treatment Wound #1 - Amputation Site - Transmetatarsal Wound Laterality: Right Cleanser: Soap and Water [facility]) Every Other Day/30 Days Discharge Instructions: May shower and wash wound with dial antibacterial soap and water prior to dressing change. Cleanser: Wound Cleanser Every Other Day/30 Days Discharge Instructions: Cleanse the wound with wound cleanser prior to applying a clean dressing using gauze sponges, not tissue or cotton balls. Prim Dressing: KerraCel Ag Gelling Fiber Dressing, 4x5 in (silver alginate) [facility]) Every  Other Day/30 Days ary Discharge Instructions: Apply silver alginate to wound bed as instructed Secondary Dressing: Woven Gauze Sponge, Non-Sterile 4x4 in [facility]) Every Other Day/30 Days Discharge Instructions: Apply over primary dressing as directed. Secondary Dressing: ABD Pad, 5x9 [facility]) Every Other Day/30 Days Discharge Instructions: Apply over primary dressing as directed. Secured With: [employer], 4.5x3.1 (in/yd) [facility]) Every Other Day/30 Days Discharge Instructions: Secure with Kerlix as directed. Secured With: Transpore Surgical Tape, 2x10 (in/yd) [facility]) Every Other Day/30 Days Discharge Instructions: Secure dressing with tape as directed. Electronic Signature(s) Signed: 09/05/2020 5:10:19 PM By: Lorrin Jackson Signed: 09/05/2020 5:16:47 PM By: [name] MD Entered By: Lorrin Jackson on 09/05/2020 14:10:27 -------------------------------------------------------------------------------- Problem List Details Patient Name: Date of Service: [name], [name] 09/05/2020 12:30 PM Medical Record Number: [id number] Patient Account Number: [id number] Date of Birth/Sex: Treating RN: September 07, 1970 (50 y.o. Male) Lorrin Jackson Primary Care Provider: [name] Other Clinician: Referring Provider: Treating Provider/Extender: Lenetta Quaker in Treatment: 0 Active  Problems ICD-10 Encounter Code Description Active Date MDM Diagnosis E11.621 Type 2 diabetes mellitus with foot ulcer 09/05/2020 No Yes L97.514 Non-pressure chronic ulcer of other part of right foot with necrosis of bone 09/05/2020 No Yes T81.31XS Disruption of external operation (surgical) wound, not elsewhere classified, 09/05/2020 No Yes sequela M86.671 Other chronic osteomyelitis, right ankle and foot 09/05/2020 No Yes Inactive Problems Resolved Problems Electronic Signature(s) Signed: 09/05/2020 5:16:47 PM By: [name] MD Entered By: [name] on 09/05/2020 14:34:18 -------------------------------------------------------------------------------- Progress Note Details Patient Name: Date of Service: [name], [name] 09/05/2020 12:30 PM Medical Record Number: [id number] Patient Account Number: [id number] Date of Birth/Sex: Treating RN: 11-09-1970 (50 y.o. Male) Lorrin Jackson Primary Care Provider: [name] Other Clinician: Referring Provider: Treating Provider/Extender: Tomasa Blase, [name] in Treatment: 0 Subjective Chief Complaint Information obtained from Patient 09/05/2020; patient is here for review of the wound extensively on a right TMA amputation site History of Present Illness (HPI) ADMISSION 09/05/2020 This is a 50 year old man who has type 2 diabetes. Essentially the problem began admitted to [facility] health in December with infection gas gangrene. Cultures ultimately grew staph lugdunensis. 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 March Rummage. Shortly thereafter he developed a nonhealing wound with wound dehiscence noted on 05/09/2020. He had a wound VAC I think for a period of time after the surgery but it did not help. He has undergone an IandD and skin graft on the right foot on 05/16/2020. He underwent a further IandD on 06/04/2020 not debrided to bone. Noted to have exposed bone with osteomyelitis on  07/29/2020 he underwent an operative debridement with resection of the metatarsal. Culture of the metatarsal grew Enterobacter cloacae I. He has been followed by Dr. [name] of infectious disease. Currently on Levaquin and doxycycline I think at about the 4-week of 6 mark. At the last visit with Dr. March Rummage he was supposed to have use Santyl although I do not think he has it and he has been using I think a Betadine wet-to-dry. He is referred here by Dr. [name] for our review who saw him yesterday. I cannot see that he has had imaging studies of the right foot. No MRI Past medical history includes type 2 diabetes, in 2021 and ulcer of the left foot that ultimately healed with a wound VAC, stage III chronic kidney disease, first toe amputations bilaterally, hypertension, still a 1/2 pack/day smoker. The patient had ABIs on  08/09/2018 which was 1.00 on the right and 0.96 on the left both areas had triphasic waveforms. Not felt to have an arterial issue. Patient History Information obtained from Patient. Allergies bee venom protein (honey bee) (Reaction: swelling), penicillin (Severity: Severe, Reaction: hives) Family History Cancer - Mother, Diabetes - Father, Heart Disease - Father, Hypertension - Mother,Father, No family history of Hereditary Spherocytosis, Kidney Disease, Lung Disease, Seizures, Stroke, Thyroid Problems, Tuberculosis. Social History Current every day smoker - 1/2 ppd, Marital Status - Separated, Alcohol Use - Never, Drug Use - No History, Caffeine Use - Never. Medical History Eyes Denies history of Cataracts, Glaucoma, Optic Neuritis Ear/Nose/Mouth/Throat Denies history of Chronic sinus problems/congestion, Middle ear problems Hematologic/Lymphatic Patient has history of Anemia Cardiovascular Patient has history of Coronary Artery Disease, Hypertension Endocrine Patient has history of Type II Diabetes Genitourinary Denies history of End Stage Renal Disease Integumentary (Skin) Denies  history of History of Burn Musculoskeletal Patient has history of Osteomyelitis - right foot Neurologic Patient has history of Neuropathy Oncologic Denies history of Received Chemotherapy, Received Radiation Psychiatric Denies history of Anorexia/bulimia, Confinement Anxiety Patient is treated with Insulin. Blood sugar is not tested. Hospitalization/Surgery History - right transmet amputation. - IandD right foot wound x2. - 07/29/20 IandD and partial ray amp right foot. Medical A Surgical History Notes nd Respiratory small pulmonary contusion 05/14/20 Cardiovascular hyperlipidemia Genitourinary CKD stage3 Review of Systems (ROS) Constitutional Symptoms (General Health) Denies complaints or symptoms of Fatigue, Fever, Chills, Marked Weight Change. Eyes Complains or has symptoms of Glasses / Contacts - glasses. Ear/Nose/Mouth/Throat Denies complaints or symptoms of Chronic sinus problems or rhinitis. Respiratory Denies complaints or symptoms of Chronic or frequent coughs, Shortness of Breath. Gastrointestinal Denies complaints or symptoms of Frequent diarrhea, Nausea, Vomiting. Endocrine Denies complaints or symptoms of Heat/cold intolerance. Genitourinary Denies complaints or symptoms of Frequent urination. Integumentary (Skin) Complains or has symptoms of Wounds - right foot. Musculoskeletal Denies complaints or symptoms of Muscle Pain, Muscle Weakness. Neurologic Complains or has symptoms of Numbness/parasthesias. Psychiatric Denies complaints or symptoms of Claustrophobia, Suicidal. Objective Constitutional Patient is hypertensive.. Pulse regular and within target range for patient.Marland Kitchen Respirations regular, non-labored and within target range.. Temperature is normal and within the target range for the patient.Marland Kitchen Appears in no distress. Vitals Time Taken: 12:53 PM, Height: 64 in, Source: Stated, Weight: 188 lbs, Source: Stated, BMI: 32.3, Temperature: 98.6 F, Pulse: 88 bpm,  Respiratory Rate: 18 breaths/min, Blood Pressure: 158/84 mmHg, Capillary Blood Glucose: 170 mg/dl. General Notes: glucose per pt report this am Respiratory work of breathing is normal. Cardiovascular Pedal pulses needle pulses are palpable on the right. General Notes: Wound exam; the patient has a deep wound on the transmetatarsal amputation site at the tip. This is more superficial laterally. He had 2 sutures over the top of the wound which really were not doing any good at all I remove these he has sutures laterally I did not touch. The wound probes to bone medially the rest of this has a granulated surface which is reason for cautious optimism. I used a #5 curette to clean off the surface of the granulation. This bleeds reasonably easily. The wound circumference has thick callused skin. I did not touch this but if we are successful in getting this to epithelialize something will no doubt have to be done about this. Integumentary (Hair, Skin) Wound #1 status is Open. Original cause of wound was Surgical Injury. The date acquired was: 02/28/2020. 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 The wound measures 2.2cm length x 0.7cm width x 0.8cm depth; 1.21cm^2 area and 0.968cm^3 volume. There is bone and Fat Layer (Subcutaneous Tissue) exposed. There is no tunneling or undermining noted. There is a small amount of serosanguineous drainage noted. The wound margin is distinct with the outline attached to the wound base. There is large (67-100%) red granulation within the wound bed. There is no necrotic tissue within the wound bed. Assessment Active Problems ICD-10 Type 2 diabetes mellitus with foot ulcer Non-pressure chronic ulcer of other part of right foot with necrosis of bone Disruption of external operation (surgical) wound, not elsewhere classified, sequela Other chronic osteomyelitis, right ankle and foot Procedures Wound #1 Pre-procedure diagnosis of Wound #1  is a Diabetic Wound/Ulcer of the Lower Extremity located on the Right Amputation Site - Transmetatarsal .Severity of Tissue Pre Debridement is: Bone involvement without necrosis. There was a Excisional Skin/Subcutaneous Tissue Debridement with a total area of 1.54 sq cm performed by Ricard Dillon., MD. With the following instrument(s): Curette Material removed includes Subcutaneous Tissue and Slough and after achieving pain control using Lidocaine 4% Topical Solution. No specimens were taken. A time out was conducted at 14:00, prior to the start of the procedure. A Minimum amount of bleeding was controlled with Pressure. The procedure was tolerated well. Post Debridement Measurements: 2.2cm length x 0.7cm width x 0.8cm depth; 0.968cm^3 volume. Character of Wound/Ulcer Post Debridement is stable. Severity of Tissue Post Debridement is: Bone involvement without necrosis. Post procedure Diagnosis Wound #1: Same as Pre-Procedure Plan Follow-up Appointments: Return Appointment in 1 week. - with Dr. Dellia Nims Bathing/ Shower/ Hygiene: May shower and wash wound with soap and water. - on days you change dressing Edema Control - Lymphedema / SCD / Other: Avoid standing for long periods of time. Exercise regularly Off-Loading: Open toe surgical shoe to: - Wear at all times except driving. Additional Orders / Instructions: Follow Nutritious Diet Home Health: Admit to Home Health for wound care. May utilize formulary equivalent dressing for wound treatment orders unless otherwise specified. - [facility]: 1-2x week for wound assessment and dressing changes. New wound care orders this week; continue Home Health for wound care. May utilize formulary equivalent dressing for wound treatment orders unless otherwise specified. WOUND #1: - Amputation Site - Transmetatarsal Wound Laterality: Right Cleanser: Soap and Water [facility]) Every Other Day/30 Days Discharge Instructions: May shower and  wash wound with dial antibacterial soap and water prior to dressing change. Cleanser: Wound Cleanser Every Other Day/30 Days Discharge Instructions: Cleanse the wound with wound cleanser prior to applying a clean dressing using gauze sponges, not tissue or cotton balls. Prim Dressing: KerraCel Ag Gelling Fiber Dressing, 4x5 in (silver alginate) (Home Health) Every Other Day/30 Days ary Discharge Instructions: Apply silver alginate to wound bed as instructed Secondary Dressing: Woven Gauze Sponge, Non-Sterile 4x4 in [facility]) Every Other Day/30 Days Discharge Instructions: Apply over primary dressing as directed. Secondary Dressing: ABD Pad, 5x9 [facility]) Every Other Day/30 Days Discharge Instructions: Apply over primary dressing as directed. Secured With: [employer], 4.5x3.1 (in/yd) [facility]) Every Other Day/30 Days Discharge Instructions: Secure with Kerlix as directed. Secured With: Transpore Surgical T ape, 2x10 (in/yd) (Home Health) Every Other Day/30 Days Discharge Instructions: Secure dressing with tape as directed. 1. Wagner grade 3 diabetic foot ulcer right foot status post right transmetatarsal amputation with wound dehiscence. 2. There have not been any recent imaging studies but it would appear that the involved bone was  removed by Dr. March Rummage in early May. Culture of this bone grew Enterobacter cloacae I currently on antibiotic treatment by Dr. [name] of infectious disease. 3. I do not think at this point there is any need for additional cultures as he is on antibiotics. The wound bed looks clean but there is still exposed bone. He does not appear to have an arterial issue 4. Social front has a couple of complication. Firstly he has Medicaid is primary his insurance which will not pay for outpatient advanced products. 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  He does have a modified surgical shoe and I have asked him to wear this except times when he is driving then he can switch into his normal shoe this should assure that there is no pressure on this area. 5. We use silver alginate in the wound for this week. This is predominantly because its antibacterial and absorptive of debris. Ultimately within the next week or 2 we will switch to silver collagen. I would like to have the opportunity to put an advanced product in here although I am not optimistic of getting this through Medicaid. I spent 50 minutes in review of this patient's past medical history, face-to-face evaluation and preparation of this record Electronic Signature(s) Signed: 09/05/2020 5:16:47 PM By: [name] MD Entered By: [name] on 09/05/2020 14:37:45 -------------------------------------------------------------------------------- HxROS Details Patient Name: Date of Service: [name], [name] 09/05/2020 12:30 PM Medical Record Number: [id number] Patient Account Number: [id number] Date of Birth/Sex: Treating RN: Apr 02, 1970 (50 y.o. Male) Baruch Gouty Primary Care Provider: [name] Other Clinician: Referring Provider: Treating Provider/Extender: Lenetta Quaker in Treatment: 0 Information Obtained From Patient Constitutional Symptoms (General Health) Complaints and Symptoms: Negative for: Fatigue; Fever; Chills; Marked Weight Change Eyes Complaints and Symptoms: Positive for: Glasses / Contacts - glasses Medical History: Negative for: Cataracts; Glaucoma; Optic Neuritis Ear/Nose/Mouth/Throat Complaints and Symptoms: Negative for: Chronic sinus problems or rhinitis Medical History: Negative for: Chronic sinus problems/congestion; Middle ear problems Respiratory Complaints and Symptoms: Negative for: Chronic or frequent coughs; Shortness of Breath Medical History: Past Medical History Notes: small pulmonary contusion  05/14/20 Gastrointestinal Complaints and Symptoms: Negative for: Frequent diarrhea; Nausea; Vomiting Endocrine Complaints and Symptoms: Negative for: Heat/cold intolerance Medical History: Positive for: Type II Diabetes Time with diabetes: 20 yrs Treated with: Insulin Blood sugar tested every day: No Genitourinary Complaints and Symptoms: Negative for: Frequent urination Medical History: Negative for: End Stage Renal Disease Past Medical History Notes: CKD stage3 Integumentary (Skin) Complaints and Symptoms: Positive for: Wounds - right foot Medical History: Negative for: History of Burn Musculoskeletal Complaints and Symptoms: Negative for: Muscle Pain; Muscle Weakness Medical History: Positive for: Osteomyelitis - right foot Neurologic Complaints and Symptoms: Positive for: Numbness/parasthesias Medical History: Positive for: Neuropathy Psychiatric Complaints and Symptoms: Negative for: Claustrophobia; Suicidal Medical History: Negative for: Anorexia/bulimia; Confinement Anxiety Hematologic/Lymphatic Medical History: Positive for: Anemia Cardiovascular Medical History: Positive for: Coronary Artery Disease; Hypertension Past Medical History Notes: hyperlipidemia Immunological Oncologic Medical History: Negative for: Received Chemotherapy; Received Radiation Immunizations Pneumococcal Vaccine: Received Pneumococcal Vaccination: No Implantable Devices No devices added Hospitalization / Surgery History Type of Hospitalization/Surgery right transmet amputation IandD right foot wound x2 07/29/20 IandD and partial ray amp right foot Family and Social History Cancer: Yes - Mother; Diabetes: Yes - Father; Heart Disease: Yes - Father; Hereditary Spherocytosis: No; Hypertension: Yes -  Mother,Father; Kidney Disease: No; Lung Disease: No; Seizures: No; Stroke: No; Thyroid Problems: No; Tuberculosis: No; Current every day smoker - 1/2 ppd; Marital Status - Separated;  Alcohol Use: Never; Drug Use: No History; Caffeine Use: Never; Financial Concerns: No; Food, Clothing or Shelter Needs: No; Support System Lacking: No; Transportation Concerns: No Electronic Signature(s) Signed: 09/05/2020 5:16:47 PM By: [name] MD Signed: 09/05/2020 6:16:40 PM By: Baruch Gouty RN, BSN Entered By: Baruch Gouty on 09/05/2020 13:12:24 -------------------------------------------------------------------------------- SuperBill Details Patient Name: Date of Service: [name], [name] 09/05/2020 Medical Record Number: [id number] Patient Account Number: [id number] Date of Birth/Sex: Treating RN: [birth year] (50 y.o. Male) Lorrin Jackson Primary Care Provider: [name] Other Clinician: Referring Provider: Treating Provider/Extender: Tomasa Blase, [name] in Treatment: 0 Diagnosis Coding ICD-10 Codes Code Description E11.621 Type 2 diabetes mellitus with foot ulcer L97.514 Non-pressure chronic ulcer of other part of right foot with necrosis of bone T81.31XS Disruption of external operation (surgical) wound, not elsewhere classified, sequela M86.671 Other chronic osteomyelitis, right ankle and foot Facility Procedures CPT4 Code: [id number] Description: 99213 - WOUND CARE VISIT-LEV 3 EST PT Modifier: 25 Quantity: 1 Physician Procedures : CPT4 Code Description Modifier [id number] - WC PHYS LEVEL 4 - NEW PT 25 ICD-10 Diagnosis Description E11.621 Type 2 diabetes mellitus with foot ulcer L97.514 Non-pressure chronic ulcer of other part of right foot with necrosis of bone T81.31XS  Disruption of external operation (surgical) wound, not elsewhere classified, sequela M86.671 Other chronic osteomyelitis, right ankle and foot Quantity: 1 : [id number] - WC PHYS SUBQ TISS 20 SQ CM ICD-10 Diagnosis Description L97.514 Non-pressure chronic ulcer of other part of right foot with necrosis of bone E11.621 Type 2 diabetes mellitus with foot  ulcer Quantity: 1 Electronic Signature(s) Signed: 09/05/2020 4:56:04 PM By: Lorrin Jackson Signed: 09/05/2020 5:16:47 PM By: [name] MD Entered By: Lorrin Jackson on 09/05/2020 16:56:03

## 2020-09-05 NOTE — Telephone Encounter (Signed)
CRITICAL VALUE STICKER  CRITICAL VALUE: glucose 448  RECEIVER (on-site recipient of call): Harvin Hazel, RN  DATE & TIME NOTIFIED: 09/05/20 11:10  MESSENGER (representative from lab): [facility]  MD NOTIFIED: Vu  TIME OF NOTIFICATION:11:26  RESPONSE:

## 2020-09-05 NOTE — Progress Notes (Signed)
[name], [name] ([id number]) Visit Report for 09/05/2020 Allergy List Details Patient Name: Date of Service: [place] RD, [name] 09/05/2020 12:30 PM Medical Record Number: [id number] Patient Account Number: [id number] Date of Birth/Sex: Treating RN: [birth year] (50 y.o. Male) [name] Primary Care [name]: [name] Other Clinician: Referring [name]: Treating [name]/Extender: [name] in Treatment: 0 Allergies Active Allergies bee venom protein (honey bee) Reaction: swelling penicillin Reaction: hives Severity: Severe Allergy Notes Electronic Signature(s) Signed: 09/05/2020 6:16:40 PM By: Joseph Gouty RN, BSN Entered By: [name] on 09/05/2020 12:56:54 -------------------------------------------------------------------------------- Arrival Information Details Patient Name: Date of Service: [name] RD, [name] 09/05/2020 12:30 PM Medical Record Number: [id number] Patient Account Number: [id number] Date of Birth/Sex: Treating RN: [name] (50 y.o. Male) [name] Primary Care [name]: [name] Other Clinician: Referring Coral Soler: Treating [name]/Extender: [name] in Treatment: 0 Visit Information Patient Arrived: [name] Arrival Time: 12:48 Accompanied By: self Transfer Assistance: None Patient Identification Verified: Yes Secondary Verification Process Completed: Yes Patient Requires Transmission-Based Precautions: No Patient Has Alerts: No Electronic Signature(s) Signed: 09/05/2020 6:16:40 PM By: Joseph Gouty RN, BSN Entered By: [name] on 09/05/2020 12:53:39 -------------------------------------------------------------------------------- Clinic Level of Care Assessment Details Patient Name: Date of Service: [facility] RD, [name] 09/05/2020 12:30 PM Medical Record Number: [id number] Patient Account Number: [id number] Date of Birth/Sex: Treating RN: [name] (50  y.o. Male) [name] Primary Care [name]: [name] Other Clinician: Referring Arkel Cartwright: Treating [name]/Extender: [name] in Treatment: 0 Clinic Level of Care Assessment Items TOOL 1 Quantity Score X- 1 0 Use when EandM and Procedure is performed on INITIAL visit ASSESSMENTS - Nursing Assessment / Reassessment X- 1 20 General Physical Exam (combine w/ comprehensive assessment (listed just below) when performed on new pt. evals) X- 1 25 Comprehensive Assessment (HX, ROS, Risk Assessments, Wounds Hx, etc.) ASSESSMENTS - Wound and Skin Assessment / Reassessment []  - 0 Dermatologic / Skin Assessment (not related to wound area) ASSESSMENTS - Ostomy and/or Continence Assessment and Care []  - 0 Incontinence Assessment and Management []  - 0 Ostomy Care Assessment and Management (repouching, etc.) PROCESS - Coordination of Care []  - 0 Simple Patient / Family Education for ongoing care X- 1 20 Complex (extensive) Patient / Family Education for ongoing care X- 1 10 Staff obtains Programmer, systems, Records, T Results / Process Orders est X- 1 10 Staff telephones HHA, Nursing Homes / Clarify orders / etc []  - 0 Routine Transfer to another Facility (non-emergent condition) []  - 0 Routine Hospital Admission (non-emergent condition) []  - 0 New Admissions / Biomedical engineer / Ordering NPWT Apligraf, etc. , []  - 0 Emergency Hospital Admission (emergent condition) PROCESS - Special Needs []  - 0 Pediatric / Minor Patient Management []  - 0 Isolation Patient Management []  - 0 Hearing / Language / Visual special needs []  - 0 Assessment of Community assistance (transportation, D/C planning, etc.) []  - 0 Additional assistance / Altered mentation []  - 0 Support Surface(s) Assessment (bed, cushion, seat, etc.) INTERVENTIONS - Miscellaneous []  - 0 External ear exam []  - 0 Patient Transfer (multiple staff / Civil Service fast streamer / Similar devices) []  -  0 Simple Staple / Suture removal (25 or less) []  - 0 Complex Staple / Suture removal (26 or more) []  - 0 Hypo/Hyperglycemic Management (do not check if billed separately) []  - 0 Ankle / Brachial Index (ABI) - do not check if billed separately Has the patient been seen at the hospital within the last three  years: Yes Total Score: 85 Level Of Care: New/Established - Level 3 Electronic Signature(s) Signed: 09/05/2020 5:10:19 PM By: [name] Signed: 09/05/2020 5:10:19 PM By: [name] Entered By: [name] on 09/05/2020 14:11:39 -------------------------------------------------------------------------------- Lower Extremity Assessment Details Patient Name: Date of Service: [name] RD, [name] 09/05/2020 12:30 PM Medical Record Number: [id number] Patient Account Number: [id number] Date of Birth/Sex: Treating RN: [birth year] (50 y.o. Male) [name] Primary Care [name]: [name] Other Clinician: Referring [name]: Treating [name]/Extender: [name] in Treatment: 0 [name]: [Left: No] [Right: No] E[Left: dema] [Right: :] Calf Left: Right: Point of Measurement: From Medial Instep 36.5 cm 35.1 cm Ankle Left: Right: Point of Measurement: From Medial Instep 23.4 cm 24.3 cm Vascular Assessment Pulses: Dorsalis Pedis Palpable: [Left:Yes] [Right:No] Electronic Signature(s) Signed: 09/05/2020 6:16:40 PM By: Joseph Gouty RN, BSN Entered By: [name] on 09/05/2020 13:26:40 -------------------------------------------------------------------------------- Multi Wound Chart Details Patient Name: Date of Service: [name] RD, [name] 09/05/2020 12:30 PM Medical Record Number: [id number] Patient Account Number: [id number] Date of Birth/Sex: Treating RN: [name] (50 y.o. Male) [name] Primary Care Brittanie Dosanjh: [name] Other Clinician: Referring Tynisha Ogan: Treating Shekia Kuper/Extender: [name] in Treatment: 0 Vital Signs Height(in): 64 Capillary Blood Glucose(mg/dl): 170 Weight(lbs): 188 Pulse(bpm): 67 Body Mass Index(BMI): 32 Blood Pressure(mmHg): 158/84 Temperature(F): 98.6 Respiratory Rate(breaths/min): 18 Photos: [1:No Photos Right Amputation Site -] [N/A:N/A N/A] Wound Location: [1:Transmetatarsal Surgical Injury] [N/A:N/A] Wounding Event: [1:Diabetic Wound/Ulcer of the Lower] [N/A:N/A] Primary Etiology: [1:Extremity Open Surgical Wound] [N/A:N/A] Secondary Etiology: [1:Anemia, Coronary Artery Disease,] [N/A:N/A] Comorbid History: [1:Hypertension, Type II Diabetes, Osteomyelitis, Neuropathy 02/28/2020] [N/A:N/A] Date Acquired: [1:0] [N/A:N/A] Weeks of Treatment: [1:Open] [N/A:N/A] Wound Status: [1:2.2x0.7x0.8] [N/A:N/A] Measurements L x W x D (cm) [1:1.21] [N/A:N/A] A (cm) : rea [1:0.968] [N/A:N/A] Volume (cm) : [1:Grade 3] [N/A:N/A] Classification: [1:Culture] [N/A:N/A] Wagner Verification: [1:Small] [N/A:N/A] Exudate A mount: [1:Serosanguineous] [N/A:N/A] Exudate Type: [1:red, brown] [N/A:N/A] Exudate Color: [1:Distinct, outline attached] [N/A:N/A] Wound Margin: [1:Large (67-100%)] [N/A:N/A] Granulation A mount: [1:Red] [N/A:N/A] Granulation Quality: [1:None Present (0%)] [N/A:N/A] Necrotic A mount: [1:Fat Layer (Subcutaneous Tissue): Yes N/A] Exposed Structures: [1:Bone: Yes Fascia: No Tendon: No Muscle: No Joint: No None] [N/A:N/A] Epithelialization: [1:Debridement - Excisional] [N/A:N/A] Debridement: Pre-procedure Verification/Time Out 14:00 [N/A:N/A] Taken: [1:Lidocaine 4% Topical Solution] [N/A:N/A] Pain Control: [1:Subcutaneous] [N/A:N/A] Tissue Debrided: [1:Skin/Subcutaneous Tissue] [N/A:N/A] Level: [1:1.54] [N/A:N/A] Debridement A (sq cm): [1:rea Curette] [N/A:N/A] Instrument: [1:Minimum] [N/A:N/A] Bleeding: [1:Pressure] [N/A:N/A] Hemostasis A chieved: [1:Procedure was tolerated well] [N/A:N/A] Debridement  Treatment Response: [1:2.2x0.7x0.8] [N/A:N/A] Post Debridement Measurements L x W x D (cm) [1:0.968] [N/A:N/A] Post Debridement Volume: (cm) [1:Debridement] [N/A:N/A] Treatment Notes Electronic Signature(s) Signed: 09/05/2020 5:10:19 PM By: [name] Signed: 09/05/2020 5:16:47 PM By: [name] MD Entered By: [name] on 09/05/2020 14:24:20 -------------------------------------------------------------------------------- Multi-Disciplinary Care Plan Details Patient Name: Date of Service: [name] RD, [name] 09/05/2020 12:30 PM Medical Record Number: [id number] Patient Account Number: [id number] Date of Birth/Sex: Treating RN: [name] (50 y.o. Male) [name] Primary Care Shakela Donati: [name] Other Clinician: Referring Rodger Giangregorio: Treating Talitha Dicarlo/Extender: [name] in Treatment: 0 Active Inactive Orientation to the Wound Care Program Nursing Diagnoses: Knowledge deficit related to the wound healing center program Goals: Patient/caregiver will verbalize understanding of the [place] Date Initiated: 09/05/2020 Target Resolution Date: 10/03/2020 Goal Status: Active Interventions: Provide education on orientation to the wound center Notes: Wound/Skin Impairment Nursing Diagnoses: Impaired tissue integrity Goals: Patient/caregiver will verbalize understanding of skin care regimen Date  Initiated: 09/05/2020 Target Resolution Date: 10/03/2020 Goal Status: Active Ulcer/skin breakdown will have a volume reduction of 30% by week 4 Date Initiated: 09/05/2020 Target Resolution Date: 10/03/2020 Goal Status: Active Interventions: Assess patient/caregiver ability to obtain necessary supplies Assess patient/caregiver ability to perform ulcer/skin care regimen upon admission and as needed Assess ulceration(s) every visit Provide education on ulcer and skin care Treatment Activities: Patient referred to home care :  09/05/2020 Skin care regimen initiated : 09/05/2020 Topical wound management initiated : 09/05/2020 Notes: Electronic Signature(s) Signed: 09/05/2020 5:10:19 PM By: [name] Entered By: [name] on 09/05/2020 14:01:06 -------------------------------------------------------------------------------- Pain Assessment Details Patient Name: Date of Service: [name] RD, [name] 09/05/2020 12:30 PM Medical Record Number: [id number] Patient Account Number: [id number] Date of Birth/Sex: Treating RN: [name]-[birth year]-[name] (50 y.o. Male) [name] Primary Care Rylynn Kobs: [name] Other Clinician: Referring Andretta Ergle: Treating Justine Dines/Extender: [name] in Treatment: 0 Active Problems Location of Pain Severity and Description of Pain Patient Has Paino No Site Locations Rate the pain. Current Pain Level: 0 Pain Management and Medication Current Pain Management: Electronic Signature(s) Signed: 09/05/2020 6:16:40 PM By: Joseph Gouty RN, BSN Entered By: [name] on 09/05/2020 13:36:35 -------------------------------------------------------------------------------- Patient/Caregiver Education Details Patient Name: Date of Service: [name] RD, [name] 6/10/2022andnbsp12:30 PM Medical Record Number: [id number] Patient Account Number: [id number] Date of Birth/Gender: Treating RN: [name] (50 y.o. Male) [name] Primary Care Physician: [name] Other Clinician: Referring Physician: Treating Physician/Extender: [name] in Treatment: 0 Education Assessment Education Provided To: Patient Education Topics Provided Offloading: Methods: Explain/Verbal, Printed Responses: State content correctly Rives: o Handouts: Welcome T [facility] [place] o Methods: Explain/Verbal, Printed Responses: State content correctly Wound/Skin Impairment: Methods: Demonstration,  Explain/Verbal, Printed Responses: State content correctly Electronic Signature(s) Signed: 09/05/2020 5:10:19 PM By: [name] Entered By: [name] on 09/05/2020 14:02:01 -------------------------------------------------------------------------------- Wound Assessment Details Patient Name: Date of Service: [name] RD, [name] 09/05/2020 12:30 PM Medical Record Number: [id number] Patient Account Number: [id number] Date of Birth/Sex: Treating RN: [birth year] (50 y.o. Male) [name] Primary Care Mashelle Busick: [name] Other Clinician: Referring Harvin Konicek: Treating Plato Alspaugh/Extender: [name] in Treatment: 0 Wound Status Wound Number: 1 Primary Diabetic Wound/Ulcer of the Lower Extremity Etiology: Wound Location: Right Amputation Site - Transmetatarsal Secondary Open Surgical Wound Wounding Event: Surgical Injury Etiology: Date Acquired: 02/28/2020 Wound Open Wound Open Weeks Of Treatment: 0 Status: Clustered Wound: No Comorbid Anemia, Coronary Artery Disease, Hypertension, Type II History: Diabetes, Osteomyelitis, Neuropathy Photos Wound Measurements Length: (cm) 2.2 Width: (cm) 0.7 Depth: (cm) 0.8 Area: (cm) 1.21 Volume: (cm) 0.968 % Reduction in Area: 0% % Reduction in Volume: 0% Epithelialization: None Tunneling: No Undermining: No Wound Description Classification: Grade 3 Wound Margin: Distinct, outline attached Exudate Amount: Small Exudate Type: Serosanguineous Exudate Color: red, brown Foul Odor After Cleansing: No Slough/Fibrino Yes Wound Bed Granulation Amount: Large (67-100%) Exposed Structure Granulation Quality: Red Fascia Exposed: No Necrotic Amount: None Present (0%) Fat Layer (Subcutaneous Tissue) Exposed: Yes Tendon Exposed: No Muscle Exposed: No Joint Exposed: No Bone Exposed: Yes Electronic Signature(s) Signed: 09/05/2020 4:58:44 PM By: Sandre Kitty Signed: 09/05/2020 6:16:40 PM By:  Joseph Gouty RN, BSN Entered By: Sandre Kitty on 09/05/2020 16:26:25 -------------------------------------------------------------------------------- [place] Details Patient Name: Date of Service: [name] RD, [name] 09/05/2020 12:30 PM Medical Record Number: [id number] Patient Account Number: [id number] Date of Birth/Sex: Treating RN: [name] (50 y.o. Male) [name] Primary Care Keldrick Pomplun: [name]  Other Clinician: Referring Margalit Leece: Treating Lakiya Cottam/Extender: [name] in Treatment: 0 Vital Signs Time Taken: 12:53 Temperature (F): 98.6 Height (in): 64 Pulse (bpm): 88 Source: Stated Respiratory Rate (breaths/min): 18 Weight (lbs): 188 Blood Pressure (mmHg): 158/84 Source: Stated Capillary Blood Glucose (mg/dl): 170 Body Mass Index (BMI): 32.3 Reference Range: 80 - 120 mg / dl Notes glucose per pt report this am Electronic Signature(s) Signed: 09/05/2020 6:16:40 PM By: Joseph Gouty RN, BSN Entered By: [name] on 09/05/2020 12:54:42

## 2020-09-05 NOTE — Progress Notes (Signed)
[name], [name] ([id number]) Visit Report for 09/05/2020 Abuse/Suicide Risk Screen Details Patient Name: Date of Service: [name], [name] 09/05/2020 12:30 PM Medical Record Number: [id number] Patient Account Number: [id number] Date of Birth/Sex: Treating RN: 11/27/1970 (50 y.o. Male) Baruch Gouty Primary Care Lashon Beringer: [name] Other Clinician: Referring Farren Landa: Treating Delray Reza/Extender: Tomasa Blase, [name] in Treatment: 0 Abuse/Suicide Risk Screen Items Answer ABUSE RISK SCREEN: Has anyone close to you tried to hurt or harm you recentlyo No Do you feel uncomfortable with anyone in your familyo No Has anyone forced you do things that you didnt want to doo No Electronic Signature(s) Signed: 09/05/2020 6:16:40 PM By: Baruch Gouty RN, BSN Entered By: Baruch Gouty on 09/05/2020 13:12:33 -------------------------------------------------------------------------------- Activities of Daily Living Details Patient Name: Date of Service: [name], [name] 09/05/2020 12:30 PM Medical Record Number: [id number] Patient Account Number: [id number] Date of Birth/Sex: Treating RN: [birth year] (50 y.o. Male) Baruch Gouty Primary Care Simmie Garin: [name] Other Clinician: Referring Pamila Mendibles: Treating Malinda Mayden/Extender: Lenetta Quaker in Treatment: 0 Activities of Daily Living Items Answer Activities of Daily Living (Please select one for each item) Drive Automobile Completely Able T Medications ake Completely Able Use T elephone Completely Able Care for Appearance Completely Able Use T oilet Completely Able Bath / Shower Completely Able Dress Self Completely Able Feed Self Completely Able Walk Completely Able Get In / Out Bed Completely Able Housework Completely Able Prepare Meals Completely Able Handle Money Completely Able Shop for Self Completely Able Electronic Signature(s) Signed: 09/05/2020 6:16:40 PM By:  Baruch Gouty RN, BSN Entered By: Baruch Gouty on 09/05/2020 13:13:39 -------------------------------------------------------------------------------- Education Screening Details Patient Name: Date of Service: [name], [name] 09/05/2020 12:30 PM Medical Record Number: [id number] Patient Account Number: [id number] Date of Birth/Sex: Treating RN: 1970-05-24 (50 y.o. Male) Baruch Gouty Primary Care Saga Balthazar: [name] Other Clinician: Referring Bethel Gaglio: Treating Everett Ricciardelli/Extender: Lenetta Quaker in Treatment: 0 Primary Learner Assessed: Patient Learning Preferences/Education Level/Primary Language Learning Preference: Explanation, Demonstration, Printed Material Highest Education Level: High School Preferred Language: English Cognitive Barrier Language Barrier: No Translator Needed: No Memory Deficit: No Emotional Barrier: No Cultural/Religious Beliefs Affecting Medical Care: No Physical Barrier Impaired Vision: Yes Glasses Impaired Hearing: No Decreased Hand dexterity: No Knowledge/Comprehension Knowledge Level: High Comprehension Level: High Ability to understand written instructions: High Ability to understand verbal instructions: High Motivation Anxiety Level: Calm Cooperation: Cooperative Education Importance: Acknowledges Need Interest in Health Problems: Asks Questions Perception: Coherent Willingness to Engage in Self-Management High Activities: Readiness to Engage in Self-Management High Activities: Electronic Signature(s) Signed: 09/05/2020 6:16:40 PM By: Baruch Gouty RN, BSN Entered By: Baruch Gouty on 09/05/2020 13:14:49 -------------------------------------------------------------------------------- Fall Risk Assessment Details Patient Name: Date of Service: [name], [name] 09/05/2020 12:30 PM Medical Record Number: [id number] Patient Account Number: [id number] Date of Birth/Sex: Treating RN: 12-11-1970  (50 y.o. Male) Baruch Gouty Primary Care Akoni Parton: [name] Other Clinician: Referring Felder Lebeda: Treating Nyaja [place]/Extender: Lenetta Quaker in Treatment: 0 Fall Risk Assessment Items Have you had 2 or more falls in the last 12 monthso 0 Yes Have you had any fall that resulted in injury in the last 12 monthso 0 No FALLS RISK SCREEN History of falling - immediate or within 3 months 0 No Secondary diagnosis (Do you have 2 or more medical diagnoseso) 0 No Ambulatory aid None/bed rest/wheelchair/nurse 0 No Crutches/cane/walker 15 Yes Furniture 0 No Intravenous therapy Access/Saline/Heparin Lock 0 No Gait/Transferring Normal/ bed rest/ wheelchair 0  No Weak (short steps with or without shuffle, stooped but able to lift head while walking, may seek 10 Yes support from furniture) Impaired (short steps with shuffle, may have difficulty arising from chair, head down, impaired 0 No balance) Mental Status Oriented to own ability 0 Yes Electronic Signature(s) Signed: 09/05/2020 6:16:40 PM By: Baruch Gouty RN, BSN Entered By: Baruch Gouty on 09/05/2020 13:15:15 -------------------------------------------------------------------------------- Foot Assessment Details Patient Name: Date of Service: [name], [name] 09/05/2020 12:30 PM Medical Record Number: [id number] Patient Account Number: [id number] Date of Birth/Sex: Treating RN: Jan 22, 1971 (50 y.o. Male) Baruch Gouty Primary Care Micholas Drumwright: [name] Other Clinician: Referring Froylan Hobby: Treating [name]/Extender: Tomasa Blase, [name] in Treatment: 0 Foot Assessment Items Site Locations + = Sensation present, - = Sensation absent, C = Callus, U = Ulcer R = Redness, W = Warmth, M = Maceration, PU = Pre-ulcerative lesion F = Fissure, S = Swelling, D = Dryness Assessment Right: Left: Other Deformity: No No Prior Foot Ulcer: Yes Yes Prior Amputation: Yes Yes Charcot  Joint: No No Ambulatory Status: Ambulatory With Help Assistance Device: Cane Gait: Steady Electronic Signature(s) Signed: 09/05/2020 6:16:40 PM By: Baruch Gouty RN, BSN Entered By: Baruch Gouty on 09/05/2020 13:25:37 -------------------------------------------------------------------------------- Nutrition Risk Screening Details Patient Name: Date of Service: [name], [name] 09/05/2020 12:30 PM Medical Record Number: [id number] Patient Account Number: [id number] Date of Birth/Sex: Treating RN: 1970/11/26 (50 y.o. Male) Baruch Gouty Primary Care Tamerra Merkley: [name] Other Clinician: Referring Neira Bentsen: Treating Kadien Lineman/Extender: Tomasa Blase, [name] in Treatment: 0 Height (in): 64 Weight (lbs): 188 Body Mass Index (BMI): 32.3 Nutrition Risk Screening Items Score Screening NUTRITION RISK SCREEN: I have an illness or condition that made me change the kind and/or amount of food I eat 0 No I eat fewer than two meals per day 0 No I eat few fruits and vegetables, or milk products 2 Yes I have three or more drinks of beer, liquor or wine almost every day 0 No I have tooth or mouth problems that make it hard for me to eat 0 No I don't always have enough money to buy the food I need 0 No I eat alone most of the time 0 No I take three or more different prescribed or over-the-counter drugs a day 1 Yes Without wanting to, I have lost or gained 10 pounds in the last six months 2 Yes I am not always physically able to shop, cook and/or feed myself 0 No Nutrition Protocols Good Risk Protocol Moderate Risk Protocol 0 Provide education on nutrition High Risk Proctocol Risk Level: Moderate Risk Score: 5 Electronic Signature(s) Signed: 09/05/2020 6:16:40 PM By: Baruch Gouty RN, BSN Entered By: Baruch Gouty on 09/05/2020 13:16:34

## 2020-09-05 NOTE — Addendum Note (Signed)
Addended by: Hardie Pulley on: 09/05/2020 06:22 PM   Modules accepted: Orders

## 2020-09-12 ENCOUNTER — Other Ambulatory Visit: Payer: Self-pay

## 2020-09-12 ENCOUNTER — Encounter (HOSPITAL_BASED_OUTPATIENT_CLINIC_OR_DEPARTMENT_OTHER): Payer: Medicaid Other | Admitting: Internal Medicine

## 2020-09-12 DIAGNOSIS — E11621 Type 2 diabetes mellitus with foot ulcer: Secondary | ICD-10-CM | POA: Diagnosis not present

## 2020-09-12 NOTE — Progress Notes (Signed)
OWAIN, ECKERMAN ([id number]) Visit Report for 09/12/2020 Debridement Details Patient Name: Date of Service: [place] [address], [name] 09/12/2020 10:30 A M Medical Record Number: [id number] Patient Account Number: [id number] Date of Birth/Sex: Treating RN: [birth year] (50 y.o. [name] Primary Care Provider: [name] Other Clinician: Referring Provider: Treating Provider/Extender: Lenetta Quaker in Treatment: 1 Debridement Performed for Assessment: Wound #1 Right Amputation Site - Transmetatarsal Performed By: Physician Ricard Dillon., MD Debridement Type: Debridement Severity of Tissue Pre Debridement: Fat layer exposed Level of Consciousness (Pre-procedure): Awake and Alert Pre-procedure Verification/Time Out Yes - 11:49 Taken: Start Time: 11:50 Pain Control: Lidocaine 4% T opical Solution T Area Debrided (L x W): otal 0.7 (cm) x 1.5 (cm) = 1.05 (cm) Tissue and other material debrided: Non-Viable, Subcutaneous Level: Skin/Subcutaneous Tissue Debridement Description: Excisional Instrument: Curette Bleeding: Minimum Hemostasis Achieved: Pressure End Time: 11:54 Response to Treatment: Procedure was tolerated well Level of Consciousness (Post- Awake and Alert procedure): Post Debridement Measurements of Total Wound Length: (cm) 0.7 Width: (cm) 1.6 Depth: (cm) 0.9 Volume: (cm) 0.792 Character of Wound/Ulcer Post Debridement: Stable Severity of Tissue Post Debridement: Fat layer exposed Post Procedure Diagnosis Same as Pre-procedure Electronic Signature(s) Signed: 09/12/2020 5:27:23 PM By: [name] MD Signed: 09/12/2020 5:38:00 PM By: Lorrin Jackson Entered By: [name] on 09/12/2020 12:41:20 -------------------------------------------------------------------------------- HPI Details Patient Name: Date of Service: [name], [name] 09/12/2020 10:30 A M Medical Record Number: [id number] Patient Account Number:  [id number] Date of Birth/Sex: Treating RN: 28-May-1970 (50 y.o. [name] Primary Care Provider: [name] Other Clinician: Referring Provider: Treating Provider/Extender: Tomasa Blase, [name] in Treatment: 1 History of Present Illness HPI Description: ADMISSION 09/05/2020 This is a 50 year old man who has type 2 diabetes. Essentially the problem began admitted to [facility] health in December with infection gas gangrene. Cultures ultimately grew staph lugdunensis. 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 March Rummage. Shortly thereafter he developed a nonhealing wound with wound dehiscence noted on 05/09/2020. He had a wound VAC I think for a period of time after the surgery but it did not help. He has undergone an IandD and skin graft on the right foot on 05/16/2020. He underwent a further IandD on 06/04/2020 not debrided to bone. 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 Culture of the metatarsal grew Enterobacter cloacae I. He has been followed by Dr. [name] of infectious disease. Currently on Levaquin and doxycycline I think at about the 4-week of 6 mark. At the last visit with Dr. March Rummage he was supposed to have use Santyl although I do not think he has it and he has been using I think a Betadine wet-to-dry. He is referred here by Dr. [name] for our review who saw him yesterday. I cannot see that he has had imaging studies of the right foot. No MRI Past medical history includes type 2 diabetes, in 2021 and ulcer of the left foot that ultimately healed with a wound VAC, stage III chronic kidney disease, first toe amputations bilaterally, hypertension, still a 1/2 pack/day smoker. The patient had ABIs on 08/09/2018 which was 1.00 on the right and 0.96 on the left both areas had triphasic waveforms. Not felt to have an arterial issue. 09/12/2020; 1 week follow-up. He has  been doing the dressings along with home health. This is a deep punched out area in the incision of his transmit  site. We use silver alginate last week to help with drainage and antibacterial properties. He tolerated this well Electronic Signature(s) Signed: 09/12/2020 5:27:23 PM By: [name] MD Entered By: [name] on 09/12/2020 12:42:07 -------------------------------------------------------------------------------- Physical Exam Details Patient Name: Date of Service: [name], [place] [name] 09/12/2020 10:30 A M Medical Record Number: [id number] Patient Account Number: [id number] Date of Birth/Sex: Treating RN: [birth year] (50 y.o. [name] Primary Care Provider: [name] Other Clinician: Referring Provider: Treating Provider/Extender: Tomasa Blase, [name] in Treatment: 1 Constitutional Sitting or standing Blood Pressure is within target range for patient.. Pulse regular and within target range for patient.Marland Kitchen Respirations regular, non-labored and within target range.. Temperature is normal and within the target range for the patient.Marland Kitchen Appears in no distress. Notes Wound exam; deep wound on the transmetatarsal amputation site. As opposed to last week he has no exposed bone but a fairly long wound. Fortunately the granulation at the bottom of this looks healthy. I used a #5 curette to remove fibrinous debris from the wound surface and also callus from the circumference also nonviable subcutaneous tissue Electronic Signature(s) Signed: 09/12/2020 5:27:23 PM By: [name] MD Entered By: [name] on 09/12/2020 12:46:40 -------------------------------------------------------------------------------- Physician Orders Details Patient Name: Date of Service: [name], [place] [name] 09/12/2020 10:30 A M Medical Record Number: [id number] Patient Account Number: [id number] Date of Birth/Sex: Treating RN: Jul 29, 1970 (50 y.o. [name] Primary  Care Provider: [name] Other Clinician: Referring Provider: Treating Provider/Extender: Lenetta Quaker in Treatment: 1 Verbal / Phone Orders: No Diagnosis Coding ICD-10 Coding Code Description E11.621 Type 2 diabetes mellitus with foot ulcer L97.514 Non-pressure chronic ulcer of other part of right foot with necrosis of bone T81.31XS Disruption of external operation (surgical) wound, not elsewhere classified, sequela M86.671 Other chronic osteomyelitis, right ankle and foot Follow-up Appointments ppointment in 1 week. - with Dr. Dellia Nims Return A Bathing/ Shower/ Hygiene May shower and wash wound with soap and water. - on days you change dressing Edema Control - Lymphedema / SCD / Other Avoid standing for long periods of time. Exercise regularly Off-Loading Open toe surgical shoe to: - Wear at all times except driving. Additional Orders / Instructions Follow [facility] to [place] for wound care. May utilize formulary equivalent dressing for wound treatment orders unless otherwise specified. - A [facility]: 2x week for wound assessment and dressing changes. Wound Treatment Wound #1 - Amputation Site - Transmetatarsal Wound Laterality: Right Cleanser: Soap and Water [facility]) Every Other Day/30 Days Discharge Instructions: May shower and wash wound with dial antibacterial soap and water prior to dressing change. Cleanser: Wound Cleanser Every Other Day/30 Days Discharge Instructions: Cleanse the wound with wound cleanser prior to applying a clean dressing using gauze sponges, not tissue or cotton balls. Prim Dressing: KerraCel Ag Gelling Fiber Dressing, 4x5 in (silver alginate) (Home Health) Every Other Day/30 Days ary Discharge Instructions: Pack up into undermined area Secondary Dressing: Woven Gauze Sponge, Non-Sterile 4x4 in [facility]) Every Other Day/30 Days Discharge Instructions: Apply over primary  dressing as directed. Secondary Dressing: ABD Pad, 5x9 [facility]) Every Other Day/30 Days Discharge Instructions: Apply over primary dressing as directed. Secured With: [employer], 4.5x3.1 (in/yd) [facility]) Every Other Day/30 Days Discharge Instructions: Secure with Kerlix as directed. Secured With: Transpore Surgical Tape, 2x10 (in/yd) [facility]) Every Other Day/30 Days Discharge Instructions: Secure dressing with tape as directed. Electronic Signature(s) Signed: 09/12/2020 5:27:23 PM  By: [name] MD Signed: 09/12/2020 5:38:00 PM By: Lorrin Jackson Entered By: Lorrin Jackson on 09/12/2020 11:58:02 -------------------------------------------------------------------------------- Problem List Details Patient Name: Date of Service: [name], [place] [name] 09/12/2020 10:30 A M Medical Record Number: [id number] Patient Account Number: [id number] Date of Birth/Sex: Treating RN: 04/08/1970 (50 y.o. [name] Primary Care Provider: [name] Other Clinician: Referring Provider: Treating Provider/Extender: Lenetta Quaker in Treatment: 1 Active Problems ICD-10 Encounter Code Description Active Date MDM Diagnosis E11.621 Type 2 diabetes mellitus with foot ulcer 09/05/2020 No Yes L97.514 Non-pressure chronic ulcer of other part of right foot with necrosis of bone 09/05/2020 No Yes T81.31XS Disruption of external operation (surgical) wound, not elsewhere classified, 09/05/2020 No Yes sequela M86.671 Other chronic osteomyelitis, right ankle and foot 09/05/2020 No Yes Inactive Problems Resolved Problems Electronic Signature(s) Signed: 09/12/2020 5:27:23 PM By: [name] MD Previous Signature: 09/12/2020 11:49:05 AM Version By: Lorrin Jackson Entered By: [name] on 09/12/2020 12:38:27 -------------------------------------------------------------------------------- Progress Note Details Patient Name: Date of Service: [name],  [name] [name] 09/12/2020 10:30 A M Medical Record Number: [id number] Patient Account Number: [id number] Date of Birth/Sex: Treating RN: 08/25/1970 (50 y.o. [name] Primary Care Provider: [name] Other Clinician: Referring Provider: Treating Provider/Extender: Tomasa Blase, [name] in Treatment: 1 Subjective History of Present Illness (HPI) ADMISSION 09/05/2020 This is a 50 year old man who has type 2 diabetes. Essentially the problem began admitted to [facility] health in December with infection gas gangrene. Cultures ultimately grew staph lugdunensis. 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 March Rummage. Shortly thereafter he developed a nonhealing wound with wound dehiscence noted on 05/09/2020. He had a wound VAC I think for a period of time after the surgery but it did not help. He has undergone an IandD and skin graft on the right foot on 05/16/2020. He underwent a further IandD on 06/04/2020 not debrided to bone. 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 Culture of the metatarsal grew Enterobacter cloacae I. He has been followed by Dr. [name] of infectious disease. Currently on Levaquin and doxycycline I think at about the 4-week of 6 mark. At the last visit with Dr. March Rummage he was supposed to have use Santyl although I do not think he has it and he has been using I think a Betadine wet-to-dry. He is referred here by Dr. [name] for our review who saw him yesterday. I cannot see that he has had imaging studies of the right foot. No MRI Past medical history includes type 2 diabetes, in 2021 and ulcer of the left foot that ultimately healed with a wound VAC, stage III chronic kidney disease, first toe amputations bilaterally, hypertension, still a 1/2 pack/day smoker. The patient had ABIs on 08/09/2018 which was 1.00 on the right and 0.96 on the left both areas had  triphasic waveforms. Not felt to have an arterial issue. 09/12/2020; 1 week follow-up. He has been doing the dressings along with home health. This is a deep punched out area in the incision of his transmit site. We use silver alginate last week to help with drainage and antibacterial properties. He tolerated this well Objective Constitutional Sitting or standing Blood Pressure is within target range for patient.. Pulse regular and within target range for patient.Marland Kitchen Respirations regular, non-labored and within target range.. Temperature is normal and within the target range for the patient.Marland Kitchen Appears in no distress. Vitals  Time Taken: 11:27 AM, Height: 64 in, Source: Stated, Weight: 188 lbs, Source: Stated, BMI: 32.3, Temperature: 98.4 F, Pulse: 85 bpm, Respiratory Rate: 18 breaths/min, Blood Pressure: 135/76 mmHg, Capillary Blood Glucose: 120 mg/dl. General Notes: glucose per pt report this am General Notes: Wound exam; deep wound on the transmetatarsal amputation site. As opposed to last week he has no exposed bone but a fairly long wound. Fortunately the granulation at the bottom of this looks healthy. I used a #5 curette to remove fibrinous debris from the wound surface and also callus from the circumference also nonviable subcutaneous tissue Integumentary (Hair, Skin) Wound #1 status is Open. Original cause of wound was Surgical Injury. The date acquired was: 02/28/2020. The wound has been in treatment 1 weeks. The wound is located on the Right Amputation Site - Transmetatarsal. The wound measures 0.7cm length x 1.6cm width x 0.9cm depth; 0.88cm^2 area and 0.792cm^3 volume. There is bone and Fat Layer (Subcutaneous Tissue) exposed. There is no tunneling or undermining noted. There is a medium amount of serosanguineous drainage noted. The wound margin is distinct with the outline attached to the wound base. There is large (67-100%) red granulation within the wound bed. There is no necrotic tissue  within the wound bed. Assessment Active Problems ICD-10 Type 2 diabetes mellitus with foot ulcer Non-pressure chronic ulcer of other part of right foot with necrosis of bone Disruption of external operation (surgical) wound, not elsewhere classified, sequela Other chronic osteomyelitis, right ankle and foot Procedures Wound #1 Pre-procedure diagnosis of Wound #1 is a Diabetic Wound/Ulcer of the Lower Extremity located on the Right Amputation Site - Transmetatarsal .Severity of Tissue Pre Debridement is: Fat layer exposed. There was a Excisional Skin/Subcutaneous Tissue Debridement with a total area of 1.05 sq cm performed by Ricard Dillon., MD. With the following instrument(s): Curette to remove Non-Viable tissue/material. Material removed includes Subcutaneous Tissue after achieving pain control using Lidocaine 4% Topical Solution. No specimens were taken. A time out was conducted at 11:49, prior to the start of the procedure. A Minimum amount of bleeding was controlled with Pressure. The procedure was tolerated well. Post Debridement Measurements: 0.7cm length x 1.6cm width x 0.9cm depth; 0.792cm^3 volume. Character of Wound/Ulcer Post Debridement is stable. Severity of Tissue Post Debridement is: Fat layer exposed. Post procedure Diagnosis Wound #1: Same as Pre-Procedure Plan Follow-up Appointments: Return Appointment in 1 week. - with Dr. Dellia Nims Bathing/ Shower/ Hygiene: May shower and wash wound with soap and water. - on days you change dressing Edema Control - Lymphedema / SCD / Other: Avoid standing for long periods of time. Exercise regularly Off-Loading: Open toe surgical shoe to: - Wear at all times except driving. Additional Orders / Instructions: Follow Nutritious Diet Home Health: Admit to Home Health for wound care. May utilize formulary equivalent dressing for wound treatment orders unless otherwise specified. - [facility]: 2x week for wound assessment and  dressing changes. WOUND #1: - Amputation Site - Transmetatarsal Wound Laterality: Right Cleanser: Soap and Water [facility]) Every Other Day/30 Days Discharge Instructions: May shower and wash wound with dial antibacterial soap and water prior to dressing change. Cleanser: Wound Cleanser Every Other Day/30 Days Discharge Instructions: Cleanse the wound with wound cleanser prior to applying a clean dressing using gauze sponges, not tissue or cotton balls. Prim Dressing: KerraCel Ag Gelling Fiber Dressing, 4x5 in (silver alginate) (Home Health) Every Other Day/30 Days ary Discharge Instructions: Pack up into undermined area Secondary Dressing: Woven Gauze Sponge, Non-Sterile  4x4 in [facility]) Every Other Day/30 Days Discharge Instructions: Apply over primary dressing as directed. Secondary Dressing: ABD Pad, 5x9 [facility]) Every Other Day/30 Days Discharge Instructions: Apply over primary dressing as directed. Secured With: [employer], 4.5x3.1 (in/yd) [facility]) Every Other Day/30 Days Discharge Instructions: Secure with Kerlix as directed. Secured With: Transpore Surgical T ape, 2x10 (in/yd) (Home Health) Every Other Day/30 Days Discharge Instructions: Secure dressing with tape as directed. 1. Extensive debridement today were continuing with the silver alginate 2 plan would be to change to silver collagen next week. Very unfortunate we do not have an option of an advanced treatment product. 3. He has home health 4. No evidence of infection today and I could not probe to bone which was quite an improvement from last week Electronic Signature(s) Signed: 09/12/2020 5:27:23 PM By: [name] MD Entered By: [name] on 09/12/2020 12:47:38 -------------------------------------------------------------------------------- SuperBill Details Patient Name: Date of Service: [name], [name] 09/12/2020 Medical Record Number: [id number] Patient Account Number:  [id number] Date of Birth/Sex: Treating RN: [birth year] (50 y.o. [name] Primary Care Provider: [name] Other Clinician: Referring Provider: Treating Provider/Extender: Tomasa Blase, [name] in Treatment: 1 Diagnosis Coding ICD-10 Codes Code Description E11.621 Type 2 diabetes mellitus with foot ulcer L97.514 Non-pressure chronic ulcer of other part of right foot with necrosis of bone T81.31XS Disruption of external operation (surgical) wound, not elsewhere classified, sequela M86.671 Other chronic osteomyelitis, right ankle and foot Facility Procedures CPT4 Code: [id number] Description: 85929 - DEB SUBQ TISSUE 20 SQ CM/< ICD-10 Diagnosis Description L97.514 Non-pressure chronic ulcer of other part of right foot with necrosis of bone Modifier: Quantity: 1 Physician Procedures : CPT4 Code Description Modifier [id number] - WC PHYS SUBQ TISS 20 SQ CM ICD-10 Diagnosis Description L97.514 Non-pressure chronic ulcer of other part of right foot with necrosis of bone Quantity: 1 Electronic Signature(s) Signed: 09/12/2020 5:27:23 PM By: [name] MD Entered By: [name] on 09/12/2020 12:47:58

## 2020-09-12 NOTE — Progress Notes (Addendum)
Joseph, [name] ([id number]) Visit Report for 09/12/2020 Arrival Information Details Patient Name: Date of Service: [address] RD, [name] 09/12/2020 10:30 A M Medical Record Number: [id number] Patient Account Number: [id number] Date of Birth/Sex: Treating RN: 05-13-1970 (50 y.o. [name], [name] Primary Care Dariush Mcnellis: [name] Other Clinician: Referring Florian Chauca: Treating Alysia Scism/Extender: Lenetta Quaker in Treatment: 1 Visit Information History Since Last Visit Added or deleted any medications: No Patient Arrived: [name] Any new allergies or adverse reactions: No Arrival Time: 11:26 Had a fall or experienced change in No Accompanied By: self activities of daily living that may affect Transfer Assistance: None risk of falls: Patient Identification Verified: Yes Signs or symptoms of abuse/neglect since last visito No Secondary Verification Process Completed: Yes Hospitalized since last visit: No Patient Requires Transmission-Based Precautions: No Implantable device outside of the clinic excluding No Patient Has Alerts: No cellular tissue based products placed in the center since last visit: Has Dressing in Place as Prescribed: Yes Pain Present Now: Yes Electronic Signature(s) Signed: 09/12/2020 5:33:29 PM By: Baruch Gouty RN, BSN Entered By: Baruch Gouty on 09/12/2020 11:27:05 -------------------------------------------------------------------------------- Encounter Discharge Information Details Patient Name: Date of Service: [name], [place] [name] 09/12/2020 10:30 A M Medical Record Number: [id number] Patient Account Number: [id number] Date of Birth/Sex: Treating RN: [birth year] (50 y.o. [name] Primary Care Conrad Wenzlick: [name] Other Clinician: Referring Sherida Dobkins: Treating Emelynn Rance/Extender: Lenetta Quaker in Treatment: 1 Encounter Discharge Information Items Post Procedure Vitals Discharge Condition:  Stable Temperature (F): 98.4 Ambulatory Status: Cane Pulse (bpm): 85 Discharge Destination: Home Respiratory Rate (breaths/min): 18 Transportation: Private Auto Blood Pressure (mmHg): 135/76 Accompanied By: self Schedule Follow-up Appointment: Yes Clinical Summary of Care: Patient Declined Electronic Signature(s) Signed: 09/12/2020 5:33:29 PM By: Baruch Gouty RN, BSN Entered By: Baruch Gouty on 09/12/2020 17:30:58 -------------------------------------------------------------------------------- Lower Extremity Assessment Details Patient Name: Date of Service: [name], [name] 09/12/2020 10:30 A M Medical Record Number: [id number] Patient Account Number: [id number] Date of Birth/Sex: Treating RN: [birth year] (50 y.o. [name] Primary Care Josie Mesa: [name] Other Clinician: Referring Venson Ferencz: Treating Toney Lizaola/Extender: Tomasa Blase, [name] in Treatment: 1 Edema Assessment Assessed: [Left: No] [Right: No] E[Left: dema] [Right: :] Calf Left: Right: Point of Measurement: From Medial Instep 33 cm Ankle Left: Right: Point of Measurement: From Medial Instep 23.5 cm Vascular Assessment Pulses: Dorsalis Pedis Palpable: [Right:Yes] Electronic Signature(s) Signed: 09/12/2020 5:33:29 PM By: Baruch Gouty RN, BSN Entered By: Baruch Gouty on 09/12/2020 11:35:47 -------------------------------------------------------------------------------- Multi Wound Chart Details Patient Name: Date of Service: [name], [place] [name] 09/12/2020 10:30 A M Medical Record Number: [id number] Patient Account Number: [id number] Date of Birth/Sex: Treating RN: [birth year] (50 y.o. [name] Primary Care Crystalmarie Yasin: [name] Other Clinician: Referring Sung Renton: Treating Ashaunte Standley/Extender: Tomasa Blase, [name] in Treatment: 1 Vital Signs Height(in): 64 Capillary Blood Glucose(mg/dl): 120 Weight(lbs): 188 Pulse(bpm): 82 Body  Mass Index(BMI): 32 Blood Pressure(mmHg): 135/76 Temperature(F): 98.4 Respiratory Rate(breaths/min): 18 Photos: [1:No Photos Right Amputation Site -] [N/A:N/A N/A] Wound Location: [1:Transmetatarsal Surgical Injury] [N/A:N/A] Wounding Event: [1:Diabetic Wound/Ulcer of the Lower] [N/A:N/A] Primary Etiology: [1:Extremity Open Surgical Wound] [N/A:N/A] Secondary Etiology: [1:Anemia, Coronary Artery Disease,] [N/A:N/A] Comorbid History: [1:Hypertension, Type II Diabetes, Osteomyelitis, Neuropathy 02/28/2020] [N/A:N/A] Date Acquired: [1:1] [N/A:N/A] Weeks of Treatment: [1:Open] [N/A:N/A] Wound Status: [1:0.7x1.6x0.9] [N/A:N/A] Measurements L x W x D (cm) [1:0.88] [N/A:N/A] A (cm) : rea [1:0.792] [N/A:N/A] Volume (cm) : [1:27.30%] [N/A:N/A] % Reduction in A rea: [1:18.20%] [N/A:N/A] % Reduction  in Volume: [1:Grade 3] [N/A:N/A] Classification: [1:Medium] [N/A:N/A] Exudate A mount: [1:Serosanguineous] [N/A:N/A] Exudate Type: [1:red, brown] [N/A:N/A] Exudate Color: [1:Distinct, outline attached] [N/A:N/A] Wound Margin: [1:Large (67-100%)] [N/A:N/A] Granulation A mount: [1:Red] [N/A:N/A] Granulation Quality: [1:None Present (0%)] [N/A:N/A] Necrotic A mount: [1:Fat Layer (Subcutaneous Tissue): Yes N/A] Exposed Structures: [1:Bone: Yes Fascia: No Tendon: No Muscle: No Joint: No Small (1-33%)] [N/A:N/A] Epithelialization: [1:Debridement - Excisional] [N/A:N/A] Debridement: Pre-procedure Verification/Time Out 11:49 [N/A:N/A] Taken: [1:Lidocaine 4% Topical Solution] [N/A:N/A] Pain Control: [1:Subcutaneous] [N/A:N/A] Tissue Debrided: [1:Skin/Subcutaneous Tissue] [N/A:N/A] Level: [1:1.05] [N/A:N/A] Debridement A (sq cm): [1:rea Curette] [N/A:N/A] Instrument: [1:Minimum] [N/A:N/A] Bleeding: [1:Pressure] [N/A:N/A] Hemostasis A chieved: [1:Procedure was tolerated well] [N/A:N/A] Debridement Treatment Response: [1:0.7x1.6x0.9] [N/A:N/A] Post Debridement Measurements L x W x D (cm) [1:0.792]  [N/A:N/A] Post Debridement Volume: (cm) [1:Debridement] [N/A:N/A] Treatment Notes Electronic Signature(s) Signed: 09/12/2020 5:27:23 PM By: [name] MD Signed: 09/12/2020 5:38:00 PM By: Lorrin Jackson Entered By: [name] on 09/12/2020 12:41:04 -------------------------------------------------------------------------------- Multi-Disciplinary Care Plan Details Patient Name: Date of Service: [name], [place] [name] 09/12/2020 10:30 A M Medical Record Number: [id number] Patient Account Number: [id number] Date of Birth/Sex: Treating RN: 09/06/1970 (50 y.o. [name] Primary Care Malayjah Otoole: [name] Other Clinician: Referring Mercedees Convery: Treating Nirvana Blanchett/Extender: Lenetta Quaker in Treatment: 1 Active Inactive Orientation to the Wound Care Program Nursing Diagnoses: Knowledge deficit related to the wound healing center program Goals: Patient/caregiver will verbalize understanding of the [place] Date Initiated: 09/05/2020 Target Resolution Date: 10/03/2020 Goal Status: Active Interventions: Provide education on orientation to the wound center Notes: Wound/Skin Impairment Nursing Diagnoses: Impaired tissue integrity Goals: Patient/caregiver will verbalize understanding of skin care regimen Date Initiated: 09/05/2020 Target Resolution Date: 10/03/2020 Goal Status: Active Ulcer/skin breakdown will have a volume reduction of 30% by week 4 Date Initiated: 09/05/2020 Target Resolution Date: 10/03/2020 Goal Status: Active Interventions: Assess patient/caregiver ability to obtain necessary supplies Assess patient/caregiver ability to perform ulcer/skin care regimen upon admission and as needed Assess ulceration(s) every visit Provide education on ulcer and skin care Treatment Activities: Patient referred to home care : 09/05/2020 Skin care regimen initiated : 09/05/2020 Topical wound management initiated :  09/05/2020 Notes: Electronic Signature(s) Signed: 09/12/2020 11:49:19 AM By: Lorrin Jackson Entered By: Lorrin Jackson on 09/12/2020 11:49:18 -------------------------------------------------------------------------------- Pain Assessment Details Patient Name: Date of Service: [name], [name] 09/12/2020 10:30 A M Medical Record Number: [id number] Patient Account Number: [id number] Date of Birth/Sex: Treating RN: [birth year] (50 y.o. [name] Primary Care Beau Vanduzer: [name] Other Clinician: Referring Amaliya Whitelaw: Treating Antonella Upson/Extender: Lenetta Quaker in Treatment: 1 Active Problems Location of Pain Severity and Description of Pain Patient Has Paino Yes Site Locations Pain Location: Pain in Ulcers With Dressing Change: Yes Duration of the Pain. Constant / Intermittento Intermittent Rate the pain. Current Pain Level: 2 Least Pain Level: 0 Character of Pain Describe the Pain: Aching Pain Management and Medication Current Pain Management: Medication: No Rest: Yes How does your wound impact your activities of daily livingo Sleep: No Bathing: No Appetite: No Relationship With Others: No Bladder Continence: No Emotions: No Bowel Continence: No Work: No Toileting: No Drive: No Dressing: No Hobbies: No Electronic Signature(s) Signed: 09/12/2020 5:33:29 PM By: Baruch Gouty RN, BSN Entered By: Baruch Gouty on 09/12/2020 11:28:53 -------------------------------------------------------------------------------- Patient/Caregiver Education Details Patient Name: Date of Service: [name], [place] 6/17/2022andnbsp10:30 A M Medical Record Number: [id number] Patient Account Number: [id number] Date of Birth/Gender: Treating RN: 1971-02-12 (50 y.o. [name] Primary  Care Physician: [name] Other Clinician: Referring Physician: Treating Physician/Extender: Lenetta Quaker in Treatment:  1 Education Assessment Education Provided To: Patient Education Topics Provided Offloading: Methods: Explain/Verbal, Printed Responses: State content correctly Wound/Skin Impairment: Methods: Explain/Verbal, Printed Responses: State content correctly Electronic Signature(s) Signed: 09/12/2020 5:38:00 PM By: Lorrin Jackson Entered By: Lorrin Jackson on 09/12/2020 11:49:40 -------------------------------------------------------------------------------- Wound Assessment Details Patient Name: Date of Service: [name], [place] [name] 09/12/2020 10:30 A M Medical Record Number: [id number] Patient Account Number: [id number] Date of Birth/Sex: Treating RN: [birth year] (50 y.o. 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 Secondary Open Surgical Wound Wounding Event: Surgical Injury Etiology: Date Acquired: 02/28/2020 Wound Open Weeks Of Treatment: 1 Status: Clustered Wound: No Comorbid Anemia, Coronary Artery Disease, Hypertension, Type II History: Diabetes, Osteomyelitis, Neuropathy Photos Wound Measurements Length: (cm) 0.7 Width: (cm) 1.6 Depth: (cm) 0.9 Area: (cm) 0.88 Volume: (cm) 0.792 % Reduction in Area: 27.3% % Reduction in Volume: 18.2% Epithelialization: Small (1-33%) Tunneling: No Undermining: No Wound Description Classification: Grade 3 Wound Margin: Distinct, outline attached Exudate Amount: Medium Exudate Type: Serosanguineous Exudate Color: red, brown Foul Odor After Cleansing: No Slough/Fibrino Yes Wound Bed Granulation Amount: Large (67-100%) Exposed Structure Granulation Quality: Red Fascia Exposed: No Necrotic Amount: None Present (0%) Fat Layer (Subcutaneous Tissue)  Exposed: Yes Tendon Exposed: No Muscle Exposed: No Joint Exposed: No Bone Exposed: Yes Treatment Notes Wound #1 (Amputation Site - Transmetatarsal) Wound Laterality: Right Cleanser Soap and Water Discharge Instruction: May shower and wash wound with dial antibacterial soap and water prior to dressing change. Wound Cleanser Discharge Instruction: Cleanse the wound with wound cleanser prior to applying a clean dressing using gauze sponges, not tissue or cotton balls. Peri-Wound Care Topical Primary Dressing KerraCel Ag Gelling Fiber Dressing, 4x5 in (silver alginate) Discharge Instruction: Pack up into undermined area Secondary Dressing Woven Gauze Sponge, Non-Sterile 4x4 in Discharge Instruction: Apply over primary dressing as directed. ABD Pad, 5x9 Discharge Instruction: Apply over primary dressing as directed. Secured With [employer], 4.5x3.1 (in/yd) Discharge Instruction: Secure with Kerlix as directed. Transpore Surgical Tape, 2x10 (in/yd) Discharge Instruction: Secure dressing with tape as directed. Compression Wrap Compression Stockings Add-Ons Electronic Signature(s) Signed: 09/15/2020 6:32:01 PM By: Baruch Gouty RN, BSN Signed: 09/18/2020 8:38:46 AM By: Leane Call Previous Signature: 09/12/2020 5:33:29 PM Version By: Baruch Gouty RN, BSN Entered By: Leane Call on 09/15/2020 15:56:41 -------------------------------------------------------------------------------- Vitals Details Patient Name: Date of Service: [name], [place] [name] 09/12/2020 10:30 A M Medical Record Number: [id number] Patient Account Number: [id number] Date of Birth/Sex: Treating RN: 1970-12-03 (50 y.o. [name] Primary Care Scharlene Catalina: [name] Other Clinician: Referring Dewight Catino: Treating Fleming Prill/Extender: Lenetta Quaker in Treatment: 1 Vital Signs Time Taken: 11:27 Temperature (F): 98.4 Height (in): 64 Pulse (bpm): 85 Source:  Stated Respiratory Rate (breaths/min): 18 Weight (lbs): 188 Blood Pressure (mmHg): 135/76 Source: Stated Capillary Blood Glucose (mg/dl): 120 Body Mass Index (BMI): 32.3 Reference Range: 80 - 120 mg / dl Notes glucose per pt report this am Electronic Signature(s) Signed: 09/12/2020 5:33:29 PM By: Baruch Gouty RN, BSN Entered By: Baruch Gouty on 09/12/2020 11:27:56

## 2020-09-18 NOTE — Progress Notes (Signed)
  Subjective:  Patient ID: [name], male    DOB: [birth year],  MRN: [id number]  Chief Complaint  Patient presents with   Routine Post Op    POV #3 DOS 07/29/2020 RT FOOT WOUND DEBRIDEMENT W/METATARSAL RESECTINO AS INDICATED/ pt needs new  rx for diabetic shoes and inserts. Pt states he has been doing well.     DOS: 07/29/20 Procedures:             1) Debridement of wound down to and including bone.  50 y.o. male presents with the above complaint. History confirmed with patient.    Objective:  Physical Exam: tenderness at the surgical site, local edema noted and calf supple, nontender. Incision: Wound 3 x 5.5 left, 2 x 1 right centrally, 1 x 0.5 right lateral  Assessment:   1. 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 Other acute osteomyelitis of right foot ([name])   3. Ulcer of left foot, limited to breakdown of skin [name])      Plan:  Patient was evaluated and treated and all questions answered.  Post-operative State -ABx per ID -Wound excisionally debrided left, selectively debrided right -Betadine and dry sterile dressing applied left, Silvadene and dry sterile dressing applied right -Discussed that it is too early to consider fabrication of diabetic shoes  Procedure: Excisional Debridement of Wound Indication: Removal of non-viable soft tissue from the wound to promote healing.  Anesthesia: none Pre-Debridement Wound Measurements: Overlying H PK left Post-Debridement Wound Measurements: 3 cm x 5.5 cm x 0.3 cm  Type of Debridement: Sharp Excisional Tissue Removed: Non-viable soft tissue Instrumentation: 15 blade and tissue nipper Depth of Debridement: subcutaneous tissue. Technique: Sharp excisional debridement to bleeding, viable wound base.  Dressing: Dry, sterile, compression dressing. Disposition: Patient tolerated procedure well.  Procedure: Selective Debridement of Wound Rationale: Removal of devitalized tissue from the wound to promote healing.   Pre-Debridement Wound Measurements: 2x1, 1x0.5  Post-Debridement Wound Measurements: same as pre-debridement. Type of Debridement: sharp selective Instrumentation: dermal curette, tissue nipper Tissue Removed: Devitalized soft-tissue Dressing: Dry, sterile, compression dressing. Disposition: Patient tolerated procedure well.     No follow-ups on file.

## 2020-09-19 ENCOUNTER — Encounter (HOSPITAL_BASED_OUTPATIENT_CLINIC_OR_DEPARTMENT_OTHER): Payer: Medicaid Other | Admitting: Internal Medicine

## 2020-09-19 ENCOUNTER — Ambulatory Visit (INDEPENDENT_AMBULATORY_CARE_PROVIDER_SITE_OTHER): Payer: Medicaid Other | Admitting: Podiatry

## 2020-09-19 ENCOUNTER — Other Ambulatory Visit: Payer: Self-pay

## 2020-09-19 DIAGNOSIS — L97514 Non-pressure chronic ulcer of other part of right foot with necrosis of bone: Secondary | ICD-10-CM

## 2020-09-19 DIAGNOSIS — M86171 Other acute osteomyelitis, right ankle and foot: Secondary | ICD-10-CM

## 2020-09-19 DIAGNOSIS — E11621 Type 2 diabetes mellitus with foot ulcer: Secondary | ICD-10-CM | POA: Diagnosis not present

## 2020-09-19 NOTE — Progress Notes (Signed)
  Subjective:  Patient ID: [name], male    DOB: 03/16/1971,  MRN: [id number]  Chief Complaint  Patient presents with   Routine Post Op    POV#4 Pt states no new concerns, healing well, denies fever/chills/nausea/vomiting. Pt to see wound care today.   DOS: 07/29/20 Procedures:             1) Debridement of wound down to and including bone.  50 y.o. male presents with the above complaint. History confirmed with patient.    Objective:  Physical Exam: tenderness at the surgical site, local edema noted and calf supple, nontender. Incision: Wound with dehiscence but no full ulceration noted. Significant HPK about the distal wound. Early pre-ulcer right 5th met base.  Assessment:   1. 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 Other acute osteomyelitis of right foot ([place])    Plan:  Patient was evaluated and treated and all questions answered.  Post-operative State -ABx per ID -At this point will defer to [facility]. -Minimally debrided excess HPK today. -Dress with saline WTD.  Return in about 6 weeks (around 10/31/2020) for Wound Care.

## 2020-09-19 NOTE — Progress Notes (Signed)
[name], [name] ([id number]) Visit Report for 09/19/2020 Arrival Information Details Patient Name: Date of Service: [address], [name] 09/19/2020 11:00 A M Medical Record Number: [id number] Patient Account Number: [id number] Date of Birth/Sex: Treating RN: 1971/02/23 (50 y.o. [name], [name] Primary Care Eve Rey: [name] Other Clinician: Referring Ashyra Cantin: Treating Jerita Wimbush/Extender: Lenetta Quaker in Treatment: 2 Visit Information History Since Last Visit Added or deleted any medications: No Patient Arrived: [name] Any new allergies or adverse reactions: No Arrival Time: 11:17 Had a fall or experienced change in No Accompanied By: self activities of daily living that may affect Transfer Assistance: None risk of falls: Patient Identification Verified: Yes Signs or symptoms of abuse/neglect since last visito No Secondary Verification Process Completed: Yes Hospitalized since last visit: No Patient Requires Transmission-Based Precautions: No Implantable device outside of the clinic excluding No Patient Has Alerts: No cellular tissue based products placed in the center since last visit: Has Dressing in Place as Prescribed: Yes Pain Present Now: Yes Electronic Signature(s) Signed: 09/19/2020 6:18:32 PM By: Rhae Hammock RN Entered By: Rhae Hammock on 09/19/2020 11:21:35 -------------------------------------------------------------------------------- Clinic Level of Care Assessment Details Patient Name: Date of Service: [name], [place]. 09/19/2020 11:00 A M Medical Record Number: [id number] Patient Account Number: [id number] Date of Birth/Sex: Treating RN: 16-Nov-1970 (50 y.o. [name] Primary Care Adaleena Mooers: [name] Other Clinician: Referring Izamar Linden: Treating Catricia Scheerer/Extender: Lenetta Quaker in Treatment: 2 Clinic Level of Care Assessment Items TOOL 4 Quantity Score []  - 0 Use when only  an EandM is performed on FOLLOW-UP visit ASSESSMENTS - Nursing Assessment / Reassessment X- 1 10 Reassessment of Co-morbidities (includes updates in patient status) X- 1 5 Reassessment of Adherence to Treatment Plan ASSESSMENTS - Wound and Skin A ssessment / Reassessment X - Simple Wound Assessment / Reassessment - one wound 1 5 []  - 0 Complex Wound Assessment / Reassessment - multiple wounds []  - 0 Dermatologic / Skin Assessment (not related to wound area) ASSESSMENTS - Focused Assessment []  - 0 Circumferential Edema Measurements - multi extremities []  - 0 Nutritional Assessment / Counseling / Intervention X- 1 5 Lower Extremity Assessment (monofilament, tuning fork, pulses) []  - 0 Peripheral Arterial Disease Assessment (using hand held doppler) ASSESSMENTS - Ostomy and/or Continence Assessment and Care []  - 0 Incontinence Assessment and Management []  - 0 Ostomy Care Assessment and Management (repouching, etc.) PROCESS - Coordination of Care X - Simple Patient / Family Education for ongoing care 1 15 []  - 0 Complex (extensive) Patient / Family Education for ongoing care X- 1 10 Staff obtains Consents, Records, T Results / Process Orders est X- 1 10 Staff telephones HHA, Nursing Homes / Clarify orders / etc []  - 0 Routine Transfer to another Facility (non-emergent condition) []  - 0 Routine Hospital Admission (non-emergent condition) []  - 0 New Admissions / Biomedical engineer / Ordering NPWT Apligraf, etc. , []  - 0 Emergency Hospital Admission (emergent condition) X- 1 10 Simple Discharge Coordination []  - 0 Complex (extensive) Discharge Coordination PROCESS - Special Needs []  - 0 Pediatric / Minor Patient Management []  - 0 Isolation Patient Management []  - 0 Hearing / Language / Visual special needs []  - 0 Assessment of Community assistance (transportation, D/C planning, etc.) []  - 0 Additional assistance / Altered mentation []  - 0 Support Surface(s)  Assessment (bed, cushion, seat, etc.) INTERVENTIONS - Wound Cleansing / Measurement X - Simple Wound Cleansing - one wound 1 5 []  - 0 Complex Wound Cleansing -  multiple wounds X- 1 5 Wound Imaging (photographs - any number of wounds) []  - 0 Wound Tracing (instead of photographs) []  - 0 Simple Wound Measurement - one wound X- 1 5 Complex Wound Measurement - multiple wounds INTERVENTIONS - Wound Dressings X - Small Wound Dressing one or multiple wounds 1 10 []  - 0 Medium Wound Dressing one or multiple wounds []  - 0 Large Wound Dressing one or multiple wounds X- 1 5 Application of Medications - topical []  - 0 Application of Medications - injection INTERVENTIONS - Miscellaneous []  - 0 External ear exam []  - 0 Specimen Collection (cultures, biopsies, blood, body fluids, etc.) []  - 0 Specimen(s) / Culture(s) sent or taken to Lab for analysis []  - 0 Patient Transfer (multiple staff / Civil Service fast streamer / Similar devices) []  - 0 Simple Staple / Suture removal (25 or less) []  - 0 Complex Staple / Suture removal (26 or more) []  - 0 Hypo / Hyperglycemic Management (close monitor of Blood Glucose) []  - 0 Ankle / Brachial Index (ABI) - do not check if billed separately X- 1 5 Vital Signs Has the patient been seen at the hospital within the last three years: Yes Total Score: 105 Level Of Care: New/Established - Level 3 Electronic Signature(s) Signed: 09/19/2020 6:17:32 PM By: Baruch Gouty RN, BSN Entered By: Baruch Gouty on 09/19/2020 12:04:59 -------------------------------------------------------------------------------- Encounter Discharge Information Details Patient Name: Date of Service: [name] RD, [name] 09/19/2020 11:00 A M Medical Record Number: [id number] Patient Account Number: [id number] Date of Birth/Sex: Treating RN: [birth year] (50 y.o. [name] Primary Care Kyjuan Gause: [name] Other Clinician: Referring Torrez Renfroe: Treating Khaled Herda/Extender: Lenetta Quaker in Treatment: 2 Encounter Discharge Information Items Discharge Condition: Stable Ambulatory Status: Cane Discharge Destination: Home Transportation: Private Auto Accompanied By: self Schedule Follow-up Appointment: Yes Clinical Summary of Care: Electronic Signature(s) Signed: 09/19/2020 6:02:43 PM By: [name] Entered By: [name] on 09/19/2020 12:20:39 -------------------------------------------------------------------------------- Lower Extremity Assessment Details Patient Name: Date of Service: [name], [name] 09/19/2020 11:00 A M Medical Record Number: [id number] Patient Account Number: [id number] Date of Birth/Sex: Treating RN: January 17, 1971 (50 y.o. [name], [name] Primary Care Derrica Sieg: [name] Other Clinician: Referring Sefora Tietje: Treating Olympia Adelsberger/Extender: Tomasa Blase, [name] in Treatment: 2 Edema Assessment Assessed: [Left: No] [Right: Yes] Edema: [Left: N] [Right: o] Calf Left: Right: Point of Measurement: From Medial Instep 33 cm Ankle Left: Right: Point of Measurement: From Medial Instep 23.5 cm Vascular Assessment Pulses: Dorsalis Pedis Palpable: [Right:Yes] Posterior Tibial Palpable: [Right:Yes] Electronic Signature(s) Signed: 09/19/2020 6:18:32 PM By: Rhae Hammock RN Entered By: Rhae Hammock on 09/19/2020 11:26:07 -------------------------------------------------------------------------------- Multi Wound Chart Details Patient Name: Date of Service: [name] RD, [name] 09/19/2020 11:00 A M Medical Record Number: [id number] Patient Account Number: [id number] Date of Birth/Sex: Treating RN: 21-Dec-1970 (50 y.o. [name] Primary Care Yzabella Crunk: [name] Other Clinician: Referring Farrell Broerman: Treating Kizzy Olafson/Extender: Tomasa Blase, [name] in Treatment: 2 Vital Signs Height(in): 64 Capillary Blood Glucose(mg/dl): 180 Weight(lbs):  188 Pulse(bpm): 59 Body Mass Index(BMI): 32 Blood Pressure(mmHg): 144/78 Temperature(F): 98.4 Respiratory Rate(breaths/min): 17 Photos: [1:No Photos Right Amputation Site -] [N/A:N/A N/A] Wound Location: [1:Transmetatarsal Surgical Injury] [N/A:N/A] Wounding Event: [1:Diabetic Wound/Ulcer of the Lower] [N/A:N/A] Primary Etiology: [1:Extremity Open Surgical Wound] [N/A:N/A] Secondary Etiology: [1:Anemia, Coronary Artery Disease,] [N/A:N/A] Comorbid History: [1:Hypertension, Type II Diabetes, Osteomyelitis, Neuropathy 02/28/2020] [N/A:N/A] Date Acquired: [1:2] [N/A:N/A] Weeks of Treatment: [1:Open] [N/A:N/A] Wound Status: [1:0.4x1.5x0.9] [N/A:N/A] Measurements L x W x D (  cm) [1:0.471] [N/A:N/A] A (cm) : rea [1:0.424] [N/A:N/A] Volume (cm) : [1:61.10%] [N/A:N/A] % Reduction in A rea: [1:56.20%] [N/A:N/A] % Reduction in Volume: [1:Grade 3] [N/A:N/A] Classification: [1:Medium] [N/A:N/A] Exudate A mount: [1:Serosanguineous] [N/A:N/A] Exudate Type: [1:red, brown] [N/A:N/A] Exudate Color: [1:Distinct, outline attached] [N/A:N/A] Wound Margin: [1:Large (67-100%)] [N/A:N/A] Granulation A mount: [1:Red] [N/A:N/A] Granulation Quality: [1:None Present (0%)] [N/A:N/A] Necrotic A mount: [1:Fat Layer (Subcutaneous Tissue): Yes N/A] Exposed Structures: [1:Bone: Yes Fascia: No Tendon: No Muscle: No Joint: No Medium (34-66%)] [N/A:N/A] Treatment Notes Electronic Signature(s) Signed: 09/19/2020 5:20:40 PM By: [name] MD Signed: 09/19/2020 6:17:32 PM By: Baruch Gouty RN, BSN Entered By: [name] on 09/19/2020 12:15:09 -------------------------------------------------------------------------------- Multi-Disciplinary Care Plan Details Patient Name: Date of Service: [name] RD, [name] 09/19/2020 11:00 A M Medical Record Number: [id number] Patient Account Number: [id number] Date of Birth/Sex: Treating RN: [birth year] (50 y.o. [name] Primary Care Florette Thai: [name] Other Clinician: Referring Nyelle Wolfson: Treating Kento Gossman/Extender: Lenetta Quaker in Treatment: 2 Active Inactive Orientation to the Wound Care Program Nursing Diagnoses: Knowledge deficit related to the wound healing center program Goals: Patient/caregiver will verbalize understanding of the [place] Date Initiated: 09/05/2020 Target Resolution Date: 10/03/2020 Goal Status: Active Interventions: Provide education on orientation to the wound center Notes: Wound/Skin Impairment Nursing Diagnoses: Impaired tissue integrity Goals: Patient/caregiver will verbalize understanding of skin care regimen Date Initiated: 09/05/2020 Target Resolution Date: 10/03/2020 Goal Status: Active Ulcer/skin breakdown will have a volume reduction of 30% by week 4 Date Initiated: 09/05/2020 Target Resolution Date: 10/03/2020 Goal Status: Active Interventions: Assess patient/caregiver ability to obtain necessary supplies Assess patient/caregiver ability to perform ulcer/skin care regimen upon admission and as needed Assess ulceration(s) every visit Provide education on ulcer and skin care Treatment Activities: Patient referred to home care : 09/05/2020 Skin care regimen initiated : 09/05/2020 Topical wound management initiated : 09/05/2020 Notes: Electronic Signature(s) Signed: 09/19/2020 6:17:32 PM By: Baruch Gouty RN, BSN Entered By: Baruch Gouty on 09/19/2020 12:03:50 -------------------------------------------------------------------------------- Pain Assessment Details Patient Name: Date of Service: [name] RD, [name] 09/19/2020 11:00 A M Medical Record Number: [id number] Patient Account Number: [id number] Date of Birth/Sex: Treating RN: 07-Sep-1970 (50 y.o. [name], [name] Primary Care Felicitas Sine: [name] Other Clinician: Referring Cassius Cullinane: Treating Fabienne Nolasco/Extender: Lenetta Quaker in Treatment: 2 Active  Problems Location of Pain Severity and Description of Pain Patient Has Paino Yes Site Locations Pain Location: Pain in Ulcers With Dressing Change: Yes Duration of the Pain. Constant / Intermittento Intermittent Rate the pain. Current Pain Level: 6 Worst Pain Level: 6 Least Pain Level: 0 Tolerable Pain Level: 6 Character of Pain Describe the Pain: Aching Pain Management and Medication Current Pain Management: Medication: No Cold Application: No Rest: No Massage: No Activity: No T.E.N.S.: No Heat Application: No Leg drop or elevation: No Is the Current Pain Management Adequate: Adequate How does your wound impact your activities of daily livingo Sleep: No Bathing: No Appetite: No Relationship With Others: No Bladder Continence: No Emotions: No Bowel Continence: No Work: No Toileting: No Drive: No Dressing: No Hobbies: No Electronic Signature(s) Signed: 09/19/2020 6:18:32 PM By: Rhae Hammock RN Entered By: Rhae Hammock on 09/19/2020 11:22:45 -------------------------------------------------------------------------------- Patient/Caregiver Education Details Patient Name: Date of Service: [name] RD, [name] 6/24/2022andnbsp11:00 A M Medical Record Number: [id number] Patient Account Number: [id number] Date of Birth/Gender: Treating RN: 09/27/1970 (50 y.o. [name] Primary Care Physician: [name] Other Clinician: Referring Physician: Treating Physician/Extender: Tomasa Blase, Army Melia  Weeks in Treatment: 2 Education Assessment Education Provided To: Patient Education Topics Provided Wound/Skin Impairment: Methods: Explain/Verbal Responses: Reinforcements needed, State content correctly Electronic Signature(s) Signed: 09/19/2020 6:17:32 PM By: Baruch Gouty RN, BSN Entered By: Baruch Gouty on 09/19/2020 12:04:18 -------------------------------------------------------------------------------- Wound Assessment  Details Patient Name: Date of Service: [name] RD, [place] [name] 09/19/2020 11:00 A M Medical Record Number: [id number] Patient Account Number: [id number] Date of Birth/Sex: Treating RN: [birth year] (50 y.o. 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 Secondary Open Surgical Wound Wounding Event: Surgical Injury Etiology: Date Acquired: 02/28/2020 Wound Open Weeks Of Treatment: 2 Status: Clustered Wound: No Comorbid Anemia, Coronary Artery Disease, Hypertension, Type II History: Diabetes, Osteomyelitis, Neuropathy Photos Wound Measurements Length: (cm) 0.4 Width: (cm) 1.5 Depth: (cm) 0.9 Area: (cm) 0.471 Volume: (cm) 0.424 % Reduction in Area: 61.1% % Reduction in Volume: 56.2% Epithelialization: Medium (34-66%) Tunneling: No Undermining: No Wound Description Classification: Grade 3 Wound Margin: Distinct, outline attached Exudate Amount: Medium Exudate Type: Serosanguineous Exudate Color: red, brown Foul Odor After Cleansing: No Slough/Fibrino Yes Wound Bed Granulation Amount: Large (67-100%) Exposed Structure Granulation Quality: Red Fascia Exposed: No Necrotic Amount: None Present (0%) Fat Layer (Subcutaneous Tissue) Exposed: Yes Tendon Exposed: No Muscle Exposed: No Joint Exposed: No Bone Exposed: Yes Treatment Notes Wound #1 (Amputation Site - Transmetatarsal) Wound Laterality: Right Cleanser Soap and Water Discharge Instruction: May shower and wash wound with dial antibacterial soap and water prior to dressing change. Wound Cleanser Discharge Instruction: Cleanse the wound with wound cleanser prior to applying a clean dressing using gauze sponges, not tissue or cotton  balls. Peri-Wound Care Topical Primary Dressing KerraCel Ag Gelling Fiber Dressing, 4x5 in (silver alginate) Discharge Instruction: Pack up into undermined area Secondary Dressing Woven Gauze Sponge, Non-Sterile 4x4 in Discharge Instruction: Apply over primary dressing as directed. ABD Pad, 5x9 Discharge Instruction: Apply over primary dressing as directed. Secured With [employer], 4.5x3.1 (in/yd) Discharge Instruction: Secure with Kerlix as directed. Transpore Surgical Tape, 2x10 (in/yd) Discharge Instruction: Secure dressing with tape as directed. Compression Wrap Compression Stockings Add-Ons Electronic Signature(s) Signed: 09/19/2020 5:07:58 PM By: Sandre Kitty Signed: 09/19/2020 6:18:32 PM By: Rhae Hammock RN Entered By: Sandre Kitty on 09/19/2020 16:49:48 -------------------------------------------------------------------------------- Vitals Details Patient Name: Date of Service: [name] RD, [place] [name] 09/19/2020 11:00 A M Medical Record Number: [id number] Patient Account Number: [id number] Date of Birth/Sex: Treating RN: 08/18/1970 (50 y.o. [name], [name] Primary Care Letica Giaimo: [name] Other Clinician: Referring Shandale Malak: Treating Jarek Longton/Extender: Tomasa Blase, [name] in Treatment: 2 Vital Signs Time Taken: 11:21 Temperature (F): 98.4 Height (in): 64 Pulse (bpm): 88 Weight (lbs): 188 Respiratory Rate (breaths/min): 17 Body Mass Index (BMI): 32.3 Blood Pressure (mmHg): 144/78 Capillary Blood Glucose (mg/dl): 180 Reference Range: 80 - 120 mg / dl Electronic Signature(s) Signed: 09/19/2020 6:18:32 PM By: Rhae Hammock RN Signed: 09/19/2020 6:18:32 PM By: Rhae Hammock RN Entered By: Rhae Hammock on 09/19/2020 11:22:02

## 2020-09-19 NOTE — Progress Notes (Signed)
RAJ, LANDRESS ([id number]) Visit Report for 09/19/2020 HPI Details Patient Name: Date of Service: [address] RD, [name] 09/19/2020 11:00 A M Medical Record Number: [id number] Patient Account Number: [id number] Date of Birth/Sex: Treating RN: Dec 21, 1970 (50 y.o. [name] Primary Care Provider: [name] Other Clinician: Referring Provider: Treating Provider/Extender: Lenetta Quaker in Treatment: 2 History of Present Illness HPI Description: ADMISSION 09/05/2020 This is a 50 year old man who has type 2 diabetes. Essentially the problem began admitted to [facility] health in December with infection gas gangrene. Cultures ultimately grew staph lugdunensis. 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 March Rummage. Shortly thereafter he developed a nonhealing wound with wound dehiscence noted on 05/09/2020. He had a wound VAC I think for a period of time after the surgery but it did not help. He has undergone an IandD and skin graft on the right foot on 05/16/2020. He underwent a further IandD on 06/04/2020 not debrided to bone. 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 Culture of the metatarsal grew Enterobacter cloacae I. He has been followed by Dr. [name] of infectious disease. Currently on Levaquin and doxycycline I think at about the 4-week of 6 mark. At the last visit with Dr. March Rummage he was supposed to have use Santyl although I do not think he has it and he has been using I think a Betadine wet-to-dry. He is referred here by Dr. [name] for our review who saw him yesterday. I cannot see that he has had imaging studies of the right foot. No MRI Past medical history includes type 2 diabetes, in 2021 and ulcer of the left foot that ultimately healed with a wound VAC, stage III chronic kidney disease, first toe amputations bilaterally, hypertension, still a 1/2  pack/day smoker. The patient had ABIs on 08/09/2018 which was 1.00 on the right and 0.96 on the left both areas had triphasic waveforms. Not felt to have an arterial issue. 09/12/2020; 1 week follow-up. He has been doing the dressings along with home health. This is a deep punched out area in the incision of his transmit site. We use silver alginate last week to help with drainage and antibacterial properties. He tolerated this well 6/24; the patient was actually at his podiatrist this morning who debrided the wound. We have been using silver alginate. They are going to follow him in 6 weeks. Electronic Signature(s) Signed: 09/19/2020 5:20:40 PM By: [name] MD Entered By: [name] on 09/19/2020 12:16:15 -------------------------------------------------------------------------------- Physical Exam Details Patient Name: Date of Service: [name], [place] [name] 09/19/2020 11:00 A M Medical Record Number: [id number] Patient Account Number: [id number] Date of Birth/Sex: Treating RN: Aug 08, 1970 (50 y.o. [name] Primary Care Provider: [name] Other Clinician: Referring Provider: Treating Provider/Extender: Lenetta Quaker in Treatment: 2 Constitutional Patient is hypertensive.. Pulse regular and within target range for patient.Marland Kitchen Respirations regular, non-labored and within target range.. Temperature is normal and within the target range for the patient.Marland Kitchen Appears in no distress. Notes Wound exam; this is in a transmetatarsal amputation site. He has a linear horizontal slitlike area. This is even difficult to get into today. It certainly does not go to bone which is gratifying as this was clearly the case when he first came in here. Nevertheless I am not certain there is going to be enough viable tissue around this for adhesion Electronic Signature(s) Signed: 09/19/2020 5:20:40 PM  By: [name] MD Entered By: [name] on 09/19/2020  12:17:26 -------------------------------------------------------------------------------- Physician Orders Details Patient Name: Date of Service: [name], [place] [name] 09/19/2020 11:00 A M Medical Record Number: [id number] Patient Account Number: [id number] Date of Birth/Sex: Treating RN: Aug 21, 1970 (50 y.o. [name] Primary Care Provider: [name] Other Clinician: Referring Provider: Treating Provider/Extender: Lenetta Quaker in Treatment: 2 Verbal / Phone Orders: No Diagnosis Coding ICD-10 Coding Code Description E11.621 Type 2 diabetes mellitus with foot ulcer L97.514 Non-pressure chronic ulcer of other part of right foot with necrosis of bone T81.31XS Disruption of external operation (surgical) wound, not elsewhere classified, sequela M86.671 Other chronic osteomyelitis, right ankle and foot Follow-up Appointments ppointment in 1 week. - with Dr. Dellia Nims Return A Bathing/ Shower/ Hygiene May shower and wash wound with soap and water. - on days you change dressing Edema Control - Lymphedema / SCD / Other Avoid standing for long periods of time. Exercise regularly Off-Loading Open toe surgical shoe to: - Wear at all times except driving. Additional Orders / Instructions Follow Nutritious Diet Non Wound Condition Other Non Wound Condition Orders/Instructions: - cushion bottom of left foot Home Health No change in wound care orders this week; continue Home Health for wound care. May utilize formulary equivalent dressing for wound treatment orders unless otherwise specified. Other Home Health Orders/Instructions: - Advanced Wound Treatment Wound #1 - Amputation Site - Transmetatarsal Wound Laterality: Right Cleanser: Soap and Water [facility]) Every Other Day/30 Days Discharge Instructions: May shower and wash wound with dial antibacterial soap and water prior to dressing change. Cleanser: Wound Cleanser Every Other Day/30  Days Discharge Instructions: Cleanse the wound with wound cleanser prior to applying a clean dressing using gauze sponges, not tissue or cotton balls. Prim Dressing: KerraCel Ag Gelling Fiber Dressing, 4x5 in (silver alginate) (Home Health) Every Other Day/30 Days ary Discharge Instructions: Pack up into undermined area Secondary Dressing: Woven Gauze Sponge, Non-Sterile 4x4 in [facility]) Every Other Day/30 Days Discharge Instructions: Apply over primary dressing as directed. Secondary Dressing: ABD Pad, 5x9 [facility]) Every Other Day/30 Days Discharge Instructions: Apply over primary dressing as directed. Secured With: [employer], 4.5x3.1 (in/yd) [facility]) Every Other Day/30 Days Discharge Instructions: Secure with Kerlix as directed. Secured With: Transpore Surgical Tape, 2x10 (in/yd) [facility]) Every Other Day/30 Days Discharge Instructions: Secure dressing with tape as directed. Electronic Signature(s) Signed: 09/19/2020 5:20:40 PM By: [name] MD Signed: 09/19/2020 6:17:32 PM By: Baruch Gouty RN, BSN Entered By: Baruch Gouty on 09/19/2020 11:58:33 -------------------------------------------------------------------------------- Problem List Details Patient Name: Date of Service: [name], [place] [name] 09/19/2020 11:00 A M Medical Record Number: [id number] Patient Account Number: [id number] Date of Birth/Sex: Treating RN: [birth year] (50 y.o. [name] Primary Care Provider: [name] Other Clinician: Referring Provider: Treating Provider/Extender: Lenetta Quaker in Treatment: 2 Active Problems ICD-10 Encounter Code Description Active Date MDM Diagnosis E11.621 Type 2 diabetes mellitus with foot ulcer 09/05/2020 No Yes L97.514 Non-pressure chronic ulcer of other part of right foot with necrosis of bone 09/05/2020 No Yes T81.31XS Disruption of external operation (surgical) wound, not elsewhere classified, 09/05/2020  No Yes sequela M86.671 Other chronic osteomyelitis, right ankle and foot 09/05/2020 No Yes Inactive Problems Resolved Problems Electronic Signature(s) Signed: 09/19/2020 5:20:40 PM By: [name] MD Entered By: [name] on 09/19/2020 12:14:53 -------------------------------------------------------------------------------- Progress Note Details Patient Name: Date of Service: [name], [place] [name] 09/19/2020 11:00 A M Medical Record Number: [id number]  Patient Account Number: [id number] Date of Birth/Sex: Treating RN: 04/10/1970 (50 y.o. [name] Primary Care Provider: [name] Other Clinician: Referring Provider: Treating Provider/Extender: Lenetta Quaker in Treatment: 2 Subjective History of Present Illness (HPI) ADMISSION 09/05/2020 This is a 50 year old man who has type 2 diabetes. Essentially the problem began admitted to [facility] health in December with infection gas gangrene. Cultures ultimately grew staph lugdunensis. 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 March Rummage. Shortly thereafter he developed a nonhealing wound with wound dehiscence noted on 05/09/2020. He had a wound VAC I think for a period of time after the surgery but it did not help. He has undergone an IandD and skin graft on the right foot on 05/16/2020. He underwent a further IandD on 06/04/2020 not debrided to bone. 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 Culture of the metatarsal grew Enterobacter cloacae I. He has been followed by Dr. [name] of infectious disease. Currently on Levaquin and doxycycline I think at about the 4-week of 6 mark. At the last visit with Dr. March Rummage he was supposed to have use Santyl although I do not think he has it and he has been using I think a Betadine wet-to-dry. He is referred here by Dr. [name] for our review who saw him yesterday. I  cannot see that he has had imaging studies of the right foot. No MRI Past medical history includes type 2 diabetes, in 2021 and ulcer of the left foot that ultimately healed with a wound VAC, stage III chronic kidney disease, first toe amputations bilaterally, hypertension, still a 1/2 pack/day smoker. The patient had ABIs on 08/09/2018 which was 1.00 on the right and 0.96 on the left both areas had triphasic waveforms. Not felt to have an arterial issue. 09/12/2020; 1 week follow-up. He has been doing the dressings along with home health. This is a deep punched out area in the incision of his transmit site. We use silver alginate last week to help with drainage and antibacterial properties. He tolerated this well 6/24; the patient was actually at his podiatrist this morning who debrided the wound. We have been using silver alginate. They are going to follow him in 6 weeks. Objective Constitutional Patient is hypertensive.. Pulse regular and within target range for patient.Marland Kitchen Respirations regular, non-labored and within target range.. Temperature is normal and within the target range for the patient.Marland Kitchen Appears in no distress. Vitals Time Taken: 11:21 AM, Height: 64 in, Weight: 188 lbs, BMI: 32.3, Temperature: 98.4 F, Pulse: 88 bpm, Respiratory Rate: 17 breaths/min, Blood Pressure: 144/78 mmHg, Capillary Blood Glucose: 180 mg/dl. General Notes: Wound exam; this is in a transmetatarsal amputation site. He has a linear horizontal slitlike area. This is even difficult to get into today. It certainly does not go to bone which is gratifying as this was clearly the case when he first came in here. Nevertheless I am not certain there is going to be enough viable tissue around this for adhesion Integumentary (Hair, Skin) Wound #1 status is Open. Original cause of wound was Surgical Injury. The date acquired was: 02/28/2020. The wound has been in treatment 2 weeks. The wound is located on the Right Amputation  Site - Transmetatarsal. The wound measures 0.4cm length x 1.5cm width x 0.9cm depth; 0.471cm^2 area and 0.424cm^3 volume. There is bone and Fat Layer (Subcutaneous Tissue) exposed. There is no tunneling or undermining noted. There  is a medium amount of serosanguineous drainage noted. The wound margin is distinct with the outline attached to the wound base. There is large (67-100%) red granulation within the wound bed. There is no necrotic tissue within the wound bed. Assessment Active Problems ICD-10 Type 2 diabetes mellitus with foot ulcer Non-pressure chronic ulcer of other part of right foot with necrosis of bone Disruption of external operation (surgical) wound, not elsewhere classified, sequela Other chronic osteomyelitis, right ankle and foot Plan Follow-up Appointments: Return Appointment in 1 week. - with Dr. Dellia Nims Bathing/ Shower/ Hygiene: May shower and wash wound with soap and water. - on days you change dressing Edema Control - Lymphedema / SCD / Other: Avoid standing for long periods of time. Exercise regularly Off-Loading: Open toe surgical shoe to: - Wear at all times except driving. Additional Orders / Instructions: Follow Nutritious Diet Non Wound Condition: Other Non Wound Condition Orders/Instructions: - cushion bottom of left foot Home Health: No change in wound care orders this week; continue Home Health for wound care. May utilize formulary equivalent dressing for wound treatment orders unless otherwise specified. Other Home Health Orders/Instructions: - Advanced WOUND #1: - Amputation Site - Transmetatarsal Wound Laterality: Right Cleanser: Soap and Water [facility]) Every Other Day/30 Days Discharge Instructions: May shower and wash wound with dial antibacterial soap and water prior to dressing change. Cleanser: Wound Cleanser Every Other Day/30 Days Discharge Instructions: Cleanse the wound with wound cleanser prior to applying a clean dressing using gauze  sponges, not tissue or cotton balls. Prim Dressing: KerraCel Ag Gelling Fiber Dressing, 4x5 in (silver alginate) (Home Health) Every Other Day/30 Days ary Discharge Instructions: Pack up into undermined area Secondary Dressing: Woven Gauze Sponge, Non-Sterile 4x4 in [facility]) Every Other Day/30 Days Discharge Instructions: Apply over primary dressing as directed. Secondary Dressing: ABD Pad, 5x9 [facility]) Every Other Day/30 Days Discharge Instructions: Apply over primary dressing as directed. Secured With: [employer], 4.5x3.1 (in/yd) [facility]) Every Other Day/30 Days Discharge Instructions: Secure with Kerlix as directed. Secured With: Transpore Surgical T ape, 2x10 (in/yd) (Home Health) Every Other Day/30 Days Discharge Instructions: Secure dressing with tape as directed. 1. Smaller and certainly the improvement in depth indicated by the lack of exposed bone. 2. There is no evidence of infection 3. Nevertheless I am uncertain about how easily this is going to heal. The walls of the tissue around the wound looked fairly senescent and I am not certain that we will be able to get easily viable tissue over this area. An extensive debridement may be necessary although I am hoping to avoid this Electronic Signature(s) Signed: 09/19/2020 5:20:40 PM By: [name] MD Entered By: [name] on 09/19/2020 12:18:42 -------------------------------------------------------------------------------- SuperBill Details Patient Name: Date of Service: [name], [name] 09/19/2020 Medical Record Number: [id number] Patient Account Number: [id number] Date of Birth/Sex: Treating RN: [birth year] (50 y.o. [name] Primary Care Provider: [name] Other Clinician: Referring Provider: Treating Provider/Extender: Lenetta Quaker in Treatment: 2 Diagnosis Coding ICD-10 Codes Code Description E11.621 Type 2 diabetes mellitus with foot  ulcer L97.514 Non-pressure chronic ulcer of other part of right foot with necrosis of bone T81.31XS Disruption of external operation (surgical) wound, not elsewhere classified, sequela M86.671 Other chronic osteomyelitis, right ankle and foot Facility Procedures CPT4 Code: [id number] Description: 99213 - WOUND CARE VISIT-LEV 3 EST PT Modifier: Quantity: 1 Physician Procedures : CPT4 Code Description Modifier [id number] - WC PHYS LEVEL 3 - EST PT ICD-10  Diagnosis Description E11.621 Type 2 diabetes mellitus with foot ulcer L97.514 Non-pressure chronic ulcer of other part of right foot with necrosis of bone T81.31XS  Disruption of external operation (surgical) wound, not elsewhere classified, sequela Quantity: 1 Electronic Signature(s) Signed: 09/19/2020 5:20:40 PM By: [name] MD Entered By: [name] on 09/19/2020 12:18:59

## 2020-09-23 ENCOUNTER — Other Ambulatory Visit: Payer: Self-pay

## 2020-09-23 ENCOUNTER — Encounter: Payer: Self-pay | Admitting: Nurse Practitioner

## 2020-09-23 ENCOUNTER — Ambulatory Visit: Payer: Medicaid Other | Attending: Nurse Practitioner | Admitting: Nurse Practitioner

## 2020-09-23 VITALS — BP 133/78 | HR 85 | Ht 69.0 in | Wt 182.2 lb

## 2020-09-23 DIAGNOSIS — F17210 Nicotine dependence, cigarettes, uncomplicated: Secondary | ICD-10-CM

## 2020-09-23 DIAGNOSIS — I10 Essential (primary) hypertension: Secondary | ICD-10-CM | POA: Diagnosis not present

## 2020-09-23 DIAGNOSIS — Z89412 Acquired absence of left great toe: Secondary | ICD-10-CM | POA: Diagnosis not present

## 2020-09-23 DIAGNOSIS — F321 Major depressive disorder, single episode, moderate: Secondary | ICD-10-CM

## 2020-09-23 DIAGNOSIS — M791 Myalgia, unspecified site: Secondary | ICD-10-CM | POA: Diagnosis not present

## 2020-09-23 DIAGNOSIS — I25119 Atherosclerotic heart disease of native coronary artery with unspecified angina pectoris: Secondary | ICD-10-CM

## 2020-09-23 DIAGNOSIS — F419 Anxiety disorder, unspecified: Secondary | ICD-10-CM | POA: Insufficient documentation

## 2020-09-23 DIAGNOSIS — L97514 Non-pressure chronic ulcer of other part of right foot with necrosis of bone: Secondary | ICD-10-CM | POA: Diagnosis not present

## 2020-09-23 DIAGNOSIS — Z794 Long term (current) use of insulin: Secondary | ICD-10-CM | POA: Diagnosis not present

## 2020-09-23 DIAGNOSIS — D509 Iron deficiency anemia, unspecified: Secondary | ICD-10-CM

## 2020-09-23 DIAGNOSIS — Z89411 Acquired absence of right great toe: Secondary | ICD-10-CM | POA: Diagnosis not present

## 2020-09-23 DIAGNOSIS — E1165 Type 2 diabetes mellitus with hyperglycemia: Secondary | ICD-10-CM | POA: Diagnosis not present

## 2020-09-23 DIAGNOSIS — E1122 Type 2 diabetes mellitus with diabetic chronic kidney disease: Secondary | ICD-10-CM | POA: Insufficient documentation

## 2020-09-23 DIAGNOSIS — I129 Hypertensive chronic kidney disease with stage 1 through stage 4 chronic kidney disease, or unspecified chronic kidney disease: Secondary | ICD-10-CM | POA: Diagnosis not present

## 2020-09-23 DIAGNOSIS — Z8349 Family history of other endocrine, nutritional and metabolic diseases: Secondary | ICD-10-CM | POA: Insufficient documentation

## 2020-09-23 DIAGNOSIS — E1142 Type 2 diabetes mellitus with diabetic polyneuropathy: Secondary | ICD-10-CM | POA: Diagnosis not present

## 2020-09-23 DIAGNOSIS — E785 Hyperlipidemia, unspecified: Secondary | ICD-10-CM | POA: Diagnosis not present

## 2020-09-23 DIAGNOSIS — K5909 Other constipation: Secondary | ICD-10-CM

## 2020-09-23 DIAGNOSIS — N1832 Chronic kidney disease, stage 3b: Secondary | ICD-10-CM | POA: Diagnosis not present

## 2020-09-23 DIAGNOSIS — Z8249 Family history of ischemic heart disease and other diseases of the circulatory system: Secondary | ICD-10-CM | POA: Insufficient documentation

## 2020-09-23 DIAGNOSIS — E11621 Type 2 diabetes mellitus with foot ulcer: Secondary | ICD-10-CM | POA: Insufficient documentation

## 2020-09-23 DIAGNOSIS — Z89421 Acquired absence of other right toe(s): Secondary | ICD-10-CM | POA: Insufficient documentation

## 2020-09-23 DIAGNOSIS — Z7982 Long term (current) use of aspirin: Secondary | ICD-10-CM | POA: Insufficient documentation

## 2020-09-23 DIAGNOSIS — F17200 Nicotine dependence, unspecified, uncomplicated: Secondary | ICD-10-CM

## 2020-09-23 DIAGNOSIS — Z833 Family history of diabetes mellitus: Secondary | ICD-10-CM | POA: Insufficient documentation

## 2020-09-23 DIAGNOSIS — Z79899 Other long term (current) drug therapy: Secondary | ICD-10-CM | POA: Insufficient documentation

## 2020-09-23 LAB — POCT GLYCOSYLATED HEMOGLOBIN (HGB A1C): HbA1c, POC (controlled diabetic range): 10.8 % — AB (ref 0.0–7.0)

## 2020-09-23 LAB — GLUCOSE, POCT (MANUAL RESULT ENTRY): POC Glucose: 223 mg/dl — AB (ref 70–99)

## 2020-09-23 MED ORDER — SENNOSIDES 8.6 MG PO TABS: 1.0000 | ORAL_TABLET | Freq: Every day | ORAL | 0 refills | Status: DC | PRN

## 2020-09-23 MED ORDER — FERROUS SULFATE 325 (65 FE) MG PO TABS: 325.0000 mg | ORAL_TABLET | Freq: Every day | ORAL | 1 refills | Status: DC

## 2020-09-23 MED ORDER — GLUCOSE BLOOD VI STRP: ORAL_STRIP | 12 refills | Status: AC

## 2020-09-23 MED ORDER — TIZANIDINE HCL 2 MG PO CAPS: 2.0000 mg | ORAL_CAPSULE | Freq: Three times a day (TID) | ORAL | 0 refills | Status: DC

## 2020-09-23 MED ORDER — CLONIDINE HCL 0.1 MG PO TABS: 0.1000 mg | ORAL_TABLET | Freq: Two times a day (BID) | ORAL | 1 refills | Status: DC

## 2020-09-23 MED ORDER — ESCITALOPRAM OXALATE 20 MG PO TABS: 20.0000 mg | ORAL_TABLET | Freq: Every day | ORAL | 1 refills | Status: DC

## 2020-09-23 MED ORDER — GABAPENTIN 300 MG PO CAPS: 300.0000 mg | ORAL_CAPSULE | Freq: Three times a day (TID) | ORAL | 3 refills | Status: DC

## 2020-09-23 MED ORDER — CARVEDILOL 25 MG PO TABS: 25.0000 mg | ORAL_TABLET | Freq: Two times a day (BID) | ORAL | 1 refills | Status: DC

## 2020-09-23 MED ORDER — AMLODIPINE BESYLATE 10 MG PO TABS
10.0000 mg | ORAL_TABLET | Freq: Every day | ORAL | 3 refills | Status: DC
Start: 2020-09-23 — End: 2021-04-02

## 2020-09-23 MED ORDER — LANTUS SOLOSTAR 100 UNIT/ML ~~LOC~~ SOPN: 15.0000 [IU] | PEN_INJECTOR | Freq: Two times a day (BID) | SUBCUTANEOUS | 1 refills | Status: DC

## 2020-09-23 MED ORDER — ATORVASTATIN CALCIUM 20 MG PO TABS: 20.0000 mg | ORAL_TABLET | Freq: Every day | ORAL | 1 refills | Status: DC

## 2020-09-23 MED ORDER — ASPIRIN EC 81 MG PO TBEC: 81.0000 mg | DELAYED_RELEASE_TABLET | Freq: Every day | ORAL | 1 refills | Status: DC

## 2020-09-23 MED ORDER — NICOTINE 21 MG/24HR TD PT24
21.0000 mg | MEDICATED_PATCH | Freq: Every day | TRANSDERMAL | 0 refills | Status: AC
  Filled 2020-09-23 – 2020-10-01 (×2): qty 42, 42d supply, fill #0

## 2020-09-23 MED ORDER — ACCU-CHEK FASTCLIX LANCETS MISC: 1.0000 | 12 refills | Status: AC
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
## 2020-09-24 ENCOUNTER — Encounter (HOSPITAL_BASED_OUTPATIENT_CLINIC_OR_DEPARTMENT_OTHER): Payer: Medicaid Other | Admitting: Internal Medicine

## 2020-09-24 DIAGNOSIS — E11621 Type 2 diabetes mellitus with foot ulcer: Secondary | ICD-10-CM | POA: Diagnosis not present

## 2020-09-24 NOTE — Progress Notes (Signed)
DEMAREE, LIBERTO ([id number]) Visit Report for 09/24/2020 Arrival Information Details Patient Name: Date of Service: [name], [name] [name] 09/24/2020 10:15 A M Medical Record Number: [id number] Patient Account Number: [id number] Date of Birth/Sex: Treating RN: June 02, 1970 (50 y.o. [name], [name] Primary Care Saraih Lorton: [name] Other Clinician: Referring Shy Guallpa: Treating Kingslee Mairena/Extender: Lenetta Quaker in Treatment: 2 Visit Information History Since Last Visit Added or deleted any medications: No Patient Arrived: [name] Any new allergies or adverse reactions: No Arrival Time: 10:05 Had a fall or experienced change in No Accompanied By: self activities of daily living that may affect Transfer Assistance: None risk of falls: Patient Identification Verified: Yes Signs or symptoms of abuse/neglect since last visito No Secondary Verification Process Completed: Yes Hospitalized since last visit: No Patient Requires Transmission-Based Precautions: No Implantable device outside of the clinic excluding No Patient Has Alerts: No cellular tissue based products placed in the center since last visit: Has Dressing in Place as Prescribed: Yes Pain Present Now: No Electronic Signature(s) Signed: 09/24/2020 6:02:58 PM By: Rhae Hammock RN Entered By: Rhae Hammock on 09/24/2020 10:05:20 -------------------------------------------------------------------------------- Clinic Level of Care Assessment Details Patient Name: Date of Service: [name], [name]. 09/24/2020 10:15 A M Medical Record Number: [id number] Patient Account Number: [id number] Date of Birth/Sex: Treating RN: [birth year] (50 y.o. [name] Primary Care Manna Gose: [name] Other Clinician: Referring Arslan Kier: Treating Everly Rubalcava/Extender: Lenetta Quaker in Treatment: 2 Clinic Level of Care Assessment Items TOOL 4 Quantity Score X- 1 0 Use when only an  EandM is performed on FOLLOW-UP visit ASSESSMENTS - Nursing Assessment / Reassessment X- 1 10 Reassessment of Co-morbidities (includes updates in patient status) X- 1 5 Reassessment of Adherence to Treatment Plan ASSESSMENTS - Wound and Skin A ssessment / Reassessment X - Simple Wound Assessment / Reassessment - one wound 1 5 []  - 0 Complex Wound Assessment / Reassessment - multiple wounds []  - 0 Dermatologic / Skin Assessment (not related to wound area) ASSESSMENTS - Focused Assessment []  - 0 Circumferential Edema Measurements - multi extremities []  - 0 Nutritional Assessment / Counseling / Intervention X- 1 5 Lower Extremity Assessment (monofilament, tuning fork, pulses) []  - 0 Peripheral Arterial Disease Assessment (using hand held doppler) ASSESSMENTS - Ostomy and/or Continence Assessment and Care []  - 0 Incontinence Assessment and Management []  - 0 Ostomy Care Assessment and Management (repouching, etc.) PROCESS - Coordination of Care X - Simple Patient / Family Education for ongoing care 1 15 []  - 0 Complex (extensive) Patient / Family Education for ongoing care X- 1 10 Staff obtains Consents, Records, T Results / Process Orders est X- 1 10 Staff telephones HHA, Nursing Homes / Clarify orders / etc []  - 0 Routine Transfer to another Facility (non-emergent condition) []  - 0 Routine Hospital Admission (non-emergent condition) []  - 0 New Admissions / Biomedical engineer / Ordering NPWT Apligraf, etc. , []  - 0 Emergency Hospital Admission (emergent condition) X- 1 10 Simple Discharge Coordination []  - 0 Complex (extensive) Discharge Coordination PROCESS - Special Needs []  - 0 Pediatric / Minor Patient Management []  - 0 Isolation Patient Management []  - 0 Hearing / Language / Visual special needs []  - 0 Assessment of Community assistance (transportation, D/C planning, etc.) []  - 0 Additional assistance / Altered mentation []  - 0 Support Surface(s)  Assessment (bed, cushion, seat, etc.) INTERVENTIONS - Wound Cleansing / Measurement X - Simple Wound Cleansing - one wound 1 5 []  - 0 Complex Wound Cleansing -  multiple wounds X- 1 5 Wound Imaging (photographs - any number of wounds) []  - 0 Wound Tracing (instead of photographs) X- 1 5 Simple Wound Measurement - one wound []  - 0 Complex Wound Measurement - multiple wounds INTERVENTIONS - Wound Dressings X - Small Wound Dressing one or multiple wounds 1 10 []  - 0 Medium Wound Dressing one or multiple wounds []  - 0 Large Wound Dressing one or multiple wounds X- 1 5 Application of Medications - topical []  - 0 Application of Medications - injection INTERVENTIONS - Miscellaneous []  - 0 External ear exam []  - 0 Specimen Collection (cultures, biopsies, blood, body fluids, etc.) []  - 0 Specimen(s) / Culture(s) sent or taken to Lab for analysis []  - 0 Patient Transfer (multiple staff / Civil Service fast streamer / Similar devices) []  - 0 Simple Staple / Suture removal (25 or less) []  - 0 Complex Staple / Suture removal (26 or more) []  - 0 Hypo / Hyperglycemic Management (close monitor of Blood Glucose) []  - 0 Ankle / Brachial Index (ABI) - do not check if billed separately X- 1 5 Vital Signs Has the patient been seen at the hospital within the last three years: Yes Total Score: 105 Level Of Care: New/Established - Level 3 Electronic Signature(s) Signed: 09/24/2020 4:56:05 PM By: Levan Hurst RN, BSN Entered By: Levan Hurst on 09/24/2020 16:46:22 -------------------------------------------------------------------------------- Encounter Discharge Information Details Patient Name: Date of Service: [name], [name] [name] 09/24/2020 10:15 A M Medical Record Number: [id number] Patient Account Number: [id number] Date of Birth/Sex: Treating RN: 26-Jan-1971 (50 y.o. [name] Primary Care Aarit Kashuba: [name] Other Clinician: Referring Quaran Kedzierski: Treating Jesalyn Finazzo/Extender: Lenetta Quaker in Treatment: 2 Encounter Discharge Information Items Discharge Condition: Stable Ambulatory Status: Cane Discharge Destination: Home Transportation: Private Auto Accompanied By: self Schedule Follow-up Appointment: Yes Clinical Summary of Care: Electronic Signature(s) Signed: 09/24/2020 6:10:10 PM By: [name] Entered By: [name] on 09/24/2020 10:41:25 -------------------------------------------------------------------------------- Lower Extremity Assessment Details Patient Name: Date of Service: Joseph RD, [name] [name] 09/24/2020 10:15 A M Medical Record Number: [id number] Patient Account Number: [id number] Date of Birth/Sex: Treating RN: [birth year] (50 y.o. [name], [name] Primary Care Dominik Lauricella: [name] Other Clinician: Referring Vernon Ariel: Treating Miral Hoopes/Extender: Tomasa Blase, [name] in Treatment: 2 Edema Assessment Assessed: [Left: No] [Right: Yes] Edema: [Left: N] [Right: o] Calf Left: Right: Point of Measurement: From Medial Instep 34.5 cm Ankle Left: Right: Point of Measurement: From Medial Instep 23.5 cm Vascular Assessment Pulses: Dorsalis Pedis Palpable: [Right:Yes] Posterior Tibial Palpable: [Right:Yes] Electronic Signature(s) Signed: 09/24/2020 6:02:58 PM By: Rhae Hammock RN Entered By: Rhae Hammock on 09/24/2020 10:03:59 -------------------------------------------------------------------------------- Multi Wound Chart Details Patient Name: Date of Service: [name], [name] [name] 09/24/2020 10:15 A M Medical Record Number: [id number] Patient Account Number: [id number] Date of Birth/Sex: Treating RN: July 16, 1970 (50 y.o. [name] Primary Care Brentt Fread: [name] Other Clinician: Referring Darey Hershberger: Treating Jaianna Nicoll/Extender: Tomasa Blase, [name] in Treatment: 2 Vital Signs Height(in): 64 Capillary Blood Glucose(mg/dl): 130 Weight(lbs):  188 Pulse(bpm): 53 Body Mass Index(BMI): 32 Blood Pressure(mmHg): 157/84 Temperature(F): 97.4 Respiratory Rate(breaths/min): 17 Photos: [1:No Photos Right Amputation Site -] [N/A:N/A N/A] Wound Location: [1:Transmetatarsal Surgical Injury] [N/A:N/A] Wounding Event: [1:Diabetic Wound/Ulcer of the Lower] [N/A:N/A] Primary Etiology: [1:Extremity Open Surgical Wound] [N/A:N/A] Secondary Etiology: [1:Anemia, Coronary Artery Disease,] [N/A:N/A] Comorbid History: [1:Hypertension, Type II Diabetes, Osteomyelitis, Neuropathy 02/28/2020] [N/A:N/A] Date Acquired: [1:2] [N/A:N/A] Weeks of Treatment: [1:Open] [N/A:N/A] Wound Status: [1:0.4x1.5x0.7] [N/A:N/A] Measurements L x W x D (  cm) [1:0.471] [N/A:N/A] A (cm) : rea [1:0.33] [N/A:N/A] Volume (cm) : [1:61.10%] [N/A:N/A] % Reduction in A rea: [1:65.90%] [N/A:N/A] % Reduction in Volume: [1:Grade 3] [N/A:N/A] Classification: [1:Medium] [N/A:N/A] Exudate A mount: [1:Serosanguineous] [N/A:N/A] Exudate Type: [1:red, brown] [N/A:N/A] Exudate Color: [1:Distinct, outline attached] [N/A:N/A] Wound Margin: [1:Large (67-100%)] [N/A:N/A] Granulation A mount: [1:Red] [N/A:N/A] Granulation Quality: [1:None Present (0%)] [N/A:N/A] Necrotic A mount: [1:Fat Layer (Subcutaneous Tissue): Yes N/A] Exposed Structures: [1:Bone: Yes Fascia: No Tendon: No Muscle: No Joint: No Medium (34-66%)] [N/A:N/A] Treatment Notes Electronic Signature(s) Signed: 09/24/2020 4:58:19 PM By: [name] MD Signed: 09/24/2020 5:41:46 PM By: Baruch Gouty RN, BSN Entered By: [name] on 09/24/2020 10:28:00 -------------------------------------------------------------------------------- Multi-Disciplinary Care Plan Details Patient Name: Date of Service: [name], [name] [name] 09/24/2020 10:15 A M Medical Record Number: [id number] Patient Account Number: [id number] Date of Birth/Sex: Treating RN: [birth year] (50 y.o. [name] Primary Care Ovie Eastep: [name] Other Clinician: Referring Romir Klimowicz: Treating English Craighead/Extender: Lenetta Quaker in Treatment: 2 Active Inactive Orientation to the Wound Care Program Nursing Diagnoses: Knowledge deficit related to the wound healing center program Goals: Patient/caregiver will verbalize understanding of the [place] Date Initiated: 09/05/2020 Target Resolution Date: 10/03/2020 Goal Status: Active Interventions: Provide education on orientation to the wound center Notes: Wound/Skin Impairment Nursing Diagnoses: Impaired tissue integrity Goals: Patient/caregiver will verbalize understanding of skin care regimen Date Initiated: 09/05/2020 Target Resolution Date: 10/03/2020 Goal Status: Active Ulcer/skin breakdown will have a volume reduction of 30% by week 4 Date Initiated: 09/05/2020 Target Resolution Date: 10/03/2020 Goal Status: Active Interventions: Assess patient/caregiver ability to obtain necessary supplies Assess patient/caregiver ability to perform ulcer/skin care regimen upon admission and as needed Assess ulceration(s) every visit Provide education on ulcer and skin care Treatment Activities: Patient referred to home care : 09/05/2020 Skin care regimen initiated : 09/05/2020 Topical wound management initiated : 09/05/2020 Notes: Electronic Signature(s) Signed: 09/24/2020 4:56:05 PM By: Levan Hurst RN, BSN Entered By: Levan Hurst on 09/24/2020 16:44:53 -------------------------------------------------------------------------------- Pain Assessment Details Patient Name: Date of Service: [name], [name] [name] 09/24/2020 10:15 A M Medical Record Number: [id number] Patient Account Number: [id number] Date of Birth/Sex: Treating RN: [birth year] (50 y.o. [name], [name] Primary Care Gianluca Chhim: [name] Other Clinician: Referring Friend Dorfman: Treating Sylva Overley/Extender: Tomasa Blase, [name] in Treatment: 2 Active  Problems Location of Pain Severity and Description of Pain Patient Has Paino No Site Locations Pain Management and Medication Current Pain Management: Electronic Signature(s) Signed: 09/24/2020 6:02:58 PM By: Rhae Hammock RN Entered By: Rhae Hammock on 09/24/2020 10:04:33 -------------------------------------------------------------------------------- Patient/Caregiver Education Details Patient Name: Date of Service: [name], [name] 6/29/2022andnbsp10:15 A M Medical Record Number: [id number] Patient Account Number: [id number] Date of Birth/Gender: Treating RN: [birth year] (50 y.o. [name] Primary Care Physician: [name] Other Clinician: Referring Physician: Treating Physician/Extender: Lenetta Quaker in Treatment: 2 Education Assessment Education Provided To: Patient Education Topics Provided Wound/Skin Impairment: Methods: Explain/Verbal Responses: State content correctly Electronic Signature(s) Signed: 09/24/2020 4:56:05 PM By: Levan Hurst RN, BSN Entered By: Levan Hurst on 09/24/2020 16:45:03 -------------------------------------------------------------------------------- Wound Assessment Details Patient Name: Date of Service: [name], [name] [name] 09/24/2020 10:15 A M Medical Record Number: [id number] Patient Account Number: [id number] Date of Birth/Sex: Treating RN: [birth year] (50 y.o. Erie Noe Primary Care Wilburt Messina: [name] Other Clinician: Referring Tyreck Bell: Treating Kymoni Lesperance/Extender: Tomasa Blase, [name] in Treatment: 2 Wound Status Wound Number: 1 Primary Diabetic Wound/Ulcer of the Lower Extremity Etiology: Wound  Location: Right Amputation Site - Transmetatarsal Secondary Open Surgical Wound Wounding Event: Surgical Injury Etiology: Date Acquired: 02/28/2020 Wound Open Weeks Of Treatment: 2 Status: Clustered Wound: No Comorbid Anemia, Coronary Artery Disease,  Hypertension, Type II History: Diabetes, Osteomyelitis, Neuropathy Photos Wound Measurements Length: (cm) 0.4 Width: (cm) 1.5 Depth: (cm) 0.7 Area: (cm) 0.471 Volume: (cm) 0.33 % Reduction in Area: 61.1% % Reduction in Volume: 65.9% Epithelialization: Medium (34-66%) Tunneling: No Undermining: No Wound Description Classification: Grade 3 Wound Margin: Distinct, outline attached Exudate Amount: Medium Exudate Type: Serosanguineous Exudate Color: red, brown Foul Odor After Cleansing: No Slough/Fibrino Yes Wound Bed Granulation Amount: Large (67-100%) Exposed Structure Granulation Quality: Red Fascia Exposed: No Necrotic Amount: None Present (0%) Fat Layer (Subcutaneous Tissue) Exposed: Yes Tendon Exposed: No Muscle Exposed: No Joint Exposed: No Bone Exposed: Yes Treatment Notes Wound #1 (Amputation Site - Transmetatarsal) Wound Laterality: Right Cleanser Soap and Water Discharge Instruction: May shower and wash wound with dial antibacterial soap and water prior to dressing change. Wound Cleanser Discharge Instruction: Cleanse the wound with wound cleanser prior to applying a clean dressing using gauze sponges, not tissue or cotton balls. Peri-Wound Care Topical Primary Dressing KerraCel Ag Gelling Fiber Dressing, 4x5 in (silver alginate) Discharge Instruction: Pack up into undermined area Secondary Dressing Woven Gauze Sponge, Non-Sterile 4x4 in Discharge Instruction: Apply over primary dressing as directed. ABD Pad, 5x9 Discharge Instruction: Apply over primary dressing as directed. Secured With [employer], 4.5x3.1 (in/yd) Discharge Instruction: Secure with Kerlix as directed. Transpore Surgical Tape, 2x10 (in/yd) Discharge Instruction: Secure dressing with tape as directed. Compression Wrap Compression Stockings Add-Ons Electronic Signature(s) Signed: 09/24/2020 5:02:46 PM By: Sandre Kitty Signed: 09/24/2020 6:02:58 PM By: Rhae Hammock  RN Entered By: Sandre Kitty on 09/24/2020 16:30:57 -------------------------------------------------------------------------------- Vitals Details Patient Name: Date of Service: [name], [name] [name] 09/24/2020 10:15 A M Medical Record Number: [id number] Patient Account Number: [id number] Date of Birth/Sex: Treating RN: 10/28/1970 (50 y.o. [name], [name] Primary Care Kelena Garrow: [name] Other Clinician: Referring Marion Seese: Treating Zola Runion/Extender: Tomasa Blase, [name] in Treatment: 2 Vital Signs Time Taken: 10:04 Temperature (F): 97.4 Height (in): 64 Pulse (bpm): 89 Weight (lbs): 188 Respiratory Rate (breaths/min): 17 Body Mass Index (BMI): 32.3 Blood Pressure (mmHg): 157/84 Capillary Blood Glucose (mg/dl): 130 Reference Range: 80 - 120 mg / dl Electronic Signature(s) Signed: 09/24/2020 6:02:58 PM By: Rhae Hammock RN Entered By: Rhae Hammock on 09/24/2020 10:05:03

## 2020-09-24 NOTE — Progress Notes (Signed)
RONDEL, EPISCOPO ([id number]) Visit Report for 09/24/2020 HPI Details Patient Name: Date of Service: [address] [place] [address], [name] 09/24/2020 10:15 A M Medical Record Number: [id number] Patient Account Number: [id number] Date of Birth/Sex: Treating RN: 1970/08/06 (50 y.o. [name] Primary Care Provider: [name] Other Clinician: Referring Provider: Treating Provider/Extender: Lenetta Quaker in Treatment: 2 History of Present Illness HPI Description: ADMISSION 09/05/2020 This is a 50 year old man who has type 2 diabetes. Essentially the problem began admitted to [facility] in December with infection gas gangrene. Cultures ultimately grew staph lugdunensis. 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 March Rummage. Shortly thereafter he developed a nonhealing wound with wound dehiscence noted on 05/09/2020. He had a wound VAC I think for a period of time after the surgery but it did not help. He has undergone an IandD and skin graft on the right foot on 05/16/2020. He underwent a further IandD on 06/04/2020 not debrided to bone. 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 Culture of the metatarsal grew Enterobacter cloacae I. He has been followed by Dr. [name] of infectious disease. Currently on Levaquin and doxycycline I think at about the 4-week of 6 mark. At the last visit with Dr. March Rummage he was supposed to have use Santyl although I do not think he has it and he has been using I think a Betadine wet-to-dry. He is referred here by Dr. [name] for our review who saw him yesterday. I cannot see that he has had imaging studies of the right foot. No MRI Past medical history includes type 2 diabetes, in 2021 and ulcer of the left foot that ultimately healed with a wound VAC, stage III chronic kidney disease, first toe amputations bilaterally, hypertension, still a 1/2  pack/day smoker. The patient had ABIs on 08/09/2018 which was 1.00 on the right and 0.96 on the left both areas had triphasic waveforms. Not felt to have an arterial issue. 09/12/2020; 1 week follow-up. He has been doing the dressings along with home health. This is a deep punched out area in the incision of his transmit site. We use silver alginate last week to help with drainage and antibacterial properties. He tolerated this well 6/24; the patient was actually at his podiatrist this morning who debrided the wound. We have been using silver alginate. They are going to follow him in 6 weeks. 6/29; patient is using silver alginate. Apparently saw Dr. March Rummage this week although I have not checked his note I will try to do so. He is following up in 6 weeks. This was the original transmetatarsal amputation when I first saw this 3 mid part of his amputation site was deep down to bone however this seems to have progressively close down which is gratifying to see. He still has a fairly callus uneven surface however spreading this apart its difficult to identify anything that does not look epithelialized. When I first saw this I thought he would require an extensive debridement perhaps back in the OR by Dr. March Rummage however all of this appears to be looking better and at this point I would continue with the silver alginate see if this holds together over the next several weeks. He does not have an arterial issue Electronic Signature(s) Signed: 09/24/2020 4:58:19 PM By: [name] MD Entered By: [name] on 09/24/2020 10:29:49 -------------------------------------------------------------------------------- Physical Exam Details Patient Name: Date of Service: [name], [place]  [name] 09/24/2020 10:15 A M Medical Record Number: [id number] Patient Account Number: [id number] Date of Birth/Sex: Treating RN: 1970/06/26 (50 y.o. [name] Primary Care Provider: [name] Other Clinician: Referring  Provider: Treating Provider/Extender: Lenetta Quaker in Treatment: 2 Constitutional Patient is hypertensive.. Pulse regular and within target range for patient.Marland Kitchen Respirations regular, non-labored and within target range.. Temperature is normal and within the target range for the patient.Marland Kitchen Appears in no distress. Notes Wound exam; transmetatarsal amputation site. Linear definite appears to be adherent. Surrounding tissue is callused thick however spreading this apart does not reveal much that looks open to me. Certainly no palpable deep tissue no palpable bone no evidence of infection Electronic Signature(s) Signed: 09/24/2020 4:58:19 PM By: [name] MD Entered By: [name] on 09/24/2020 10:30:47 -------------------------------------------------------------------------------- Physician Orders Details Patient Name: Date of Service: [name], [place] [name] 09/24/2020 10:15 A M Medical Record Number: [id number] Patient Account Number: [id number] Date of Birth/Sex: Treating RN: [birth year] (50 y.o. [name] Primary Care Provider: [name] Other Clinician: Referring Provider: Treating Provider/Extender: Lenetta Quaker in Treatment: 2 Verbal / Phone Orders: No Diagnosis Coding ICD-10 Coding Code Description E11.621 Type 2 diabetes mellitus with foot ulcer L97.514 Non-pressure chronic ulcer of other part of right foot with necrosis of bone T81.31XS Disruption of external operation (surgical) wound, not elsewhere classified, sequela M86.671 Other chronic osteomyelitis, right ankle and foot Follow-up Appointments ppointment in 2 weeks. - with Dr. Dellia Nims Return A Bathing/ Shower/ Hygiene May shower and wash wound with soap and water. - on days you change dressing Edema Control - Lymphedema / SCD / Other Avoid standing for long periods of time. Exercise regularly Off-Loading Open toe surgical shoe to: - Wear at all  times except driving. Additional Orders / Instructions Follow Nutritious Diet Non Wound Condition Other Non Wound Condition Orders/Instructions: - cushion bottom of left foot Home Health No change in wound care orders this week; continue Home Health for wound care. May utilize formulary equivalent dressing for wound treatment orders unless otherwise specified. Other Home Health Orders/Instructions: - Advanced Wound Treatment Wound #1 - Amputation Site - Transmetatarsal Wound Laterality: Right Cleanser: Soap and Water [facility]) Every Other Day/30 Days Discharge Instructions: May shower and wash wound with dial antibacterial soap and water prior to dressing change. Cleanser: Wound Cleanser Every Other Day/30 Days Discharge Instructions: Cleanse the wound with wound cleanser prior to applying a clean dressing using gauze sponges, not tissue or cotton balls. Prim Dressing: KerraCel Ag Gelling Fiber Dressing, 4x5 in (silver alginate) (Home Health) Every Other Day/30 Days ary Discharge Instructions: Pack up into undermined area Secondary Dressing: Woven Gauze Sponge, Non-Sterile 4x4 in [facility]) Every Other Day/30 Days Discharge Instructions: Apply over primary dressing as directed. Secondary Dressing: ABD Pad, 5x9 [facility]) Every Other Day/30 Days Discharge Instructions: Apply over primary dressing as directed. Secured With: [employer], 4.5x3.1 (in/yd) [facility]) Every Other Day/30 Days Discharge Instructions: Secure with Kerlix as directed. Secured With: Transpore Surgical Tape, 2x10 (in/yd) [facility]) Every Other Day/30 Days Discharge Instructions: Secure dressing with tape as directed. Electronic Signature(s) Signed: 09/24/2020 4:56:05 PM By: Levan Hurst RN, BSN Signed: 09/24/2020 4:58:19 PM By: [name] MD Entered By: Levan Hurst on 09/24/2020 10:20:13 -------------------------------------------------------------------------------- Problem List  Details Patient Name: Date of Service: [name], [place] [name] 09/24/2020 10:15 A M Medical Record Number: [id number] Patient Account Number: [id number] Date of Birth/Sex: Treating RN: June 18, 1970 (50 y.o. Jonette Eva,  [place] Primary Care Provider: [name] Other Clinician: Referring Provider: Treating Provider/Extender: Lenetta Quaker in Treatment: 2 Active Problems ICD-10 Encounter Code Description Active Date MDM Diagnosis E11.621 Type 2 diabetes mellitus with foot ulcer 09/05/2020 No Yes L97.514 Non-pressure chronic ulcer of other part of right foot with necrosis of bone 09/05/2020 No Yes T81.31XS Disruption of external operation (surgical) wound, not elsewhere classified, 09/05/2020 No Yes sequela M86.671 Other chronic osteomyelitis, right ankle and foot 09/05/2020 No Yes Inactive Problems Resolved Problems Electronic Signature(s) Signed: 09/24/2020 4:58:19 PM By: [name] MD Entered By: [name] on 09/24/2020 10:27:48 -------------------------------------------------------------------------------- Progress Note Details Patient Name: Date of Service: [name], [place] [name] 09/24/2020 10:15 A M Medical Record Number: [id number] Patient Account Number: [id number] Date of Birth/Sex: Treating RN: [birth year] (50 y.o. [name] Primary Care Provider: [name] Other Clinician: Referring Provider: Treating Provider/Extender: Lenetta Quaker in Treatment: 2 Subjective History of Present Illness (HPI) ADMISSION 09/05/2020 This is a 50 year old man who has type 2 diabetes. Essentially the problem began admitted to [facility] in December with infection gas gangrene. Cultures ultimately grew staph lugdunensis. 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 March Rummage. Shortly thereafter he developed a nonhealing wound with wound dehiscence noted on 05/09/2020. He had a wound VAC I think for a  period of time after the surgery but it did not help. He has undergone an IandD and skin graft on the right foot on 05/16/2020. He underwent a further IandD on 06/04/2020 not debrided to bone. 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 Culture of the metatarsal grew Enterobacter cloacae I. He has been followed by Dr. [name] of infectious disease. Currently on Levaquin and doxycycline I think at about the 4-week of 6 mark. At the last visit with Dr. March Rummage he was supposed to have use Santyl although I do not think he has it and he has been using I think a Betadine wet-to-dry. He is referred here by Dr. [name] for our review who saw him yesterday. I cannot see that he has had imaging studies of the right foot. No MRI Past medical history includes type 2 diabetes, in 2021 and ulcer of the left foot that ultimately healed with a wound VAC, stage III chronic kidney disease, first toe amputations bilaterally, hypertension, still a 1/2 pack/day smoker. The patient had ABIs on 08/09/2018 which was 1.00 on the right and 0.96 on the left both areas had triphasic waveforms. Not felt to have an arterial issue. 09/12/2020; 1 week follow-up. He has been doing the dressings along with home health. This is a deep punched out area in the incision of his transmit site. We use silver alginate last week to help with drainage and antibacterial properties. He tolerated this well 6/24; the patient was actually at his podiatrist this morning who debrided the wound. We have been using silver alginate. They are going to follow him in 6 weeks. 6/29; patient is using silver alginate. Apparently saw Dr. March Rummage this week although I have not checked his note I will try to do so. He is following up in 6 weeks. This was the original transmetatarsal amputation when I first saw this 3 mid part of his amputation site was deep down to bone however this seems to  have progressively close down which is gratifying to see. He still has a fairly callus uneven surface however spreading  this apart its difficult to identify anything that does not look epithelialized. When I first saw this I thought he would require an extensive debridement perhaps back in the OR by Dr. March Rummage however all of this appears to be looking better and at this point I would continue with the silver alginate see if this holds together over the next several weeks. He does not have an arterial issue Objective Constitutional Patient is hypertensive.. Pulse regular and within target range for patient.Marland Kitchen Respirations regular, non-labored and within target range.. Temperature is normal and within the target range for the patient.Marland Kitchen Appears in no distress. Vitals Time Taken: 10:04 AM, Height: 64 in, Weight: 188 lbs, BMI: 32.3, Temperature: 97.4 F, Pulse: 89 bpm, Respiratory Rate: 17 breaths/min, Blood Pressure: 157/84 mmHg, Capillary Blood Glucose: 130 mg/dl. General Notes: Wound exam; transmetatarsal amputation site. Linear definite appears to be adherent. Surrounding tissue is callused thick however spreading this apart does not reveal much that looks open to me. Certainly no palpable deep tissue no palpable bone no evidence of infection Integumentary (Hair, Skin) Wound #1 status is Open. Original cause of wound was Surgical Injury. The date acquired was: 02/28/2020. The wound has been in treatment 2 weeks. The wound is located on the Right Amputation Site - Transmetatarsal. The wound measures 0.4cm length x 1.5cm width x 0.7cm depth; 0.471cm^2 area and 0.33cm^3 volume. There is bone and Fat Layer (Subcutaneous Tissue) exposed. There is no tunneling or undermining noted. There is a medium amount of serosanguineous drainage noted. The wound margin is distinct with the outline attached to the wound base. There is large (67-100%) red granulation within the wound bed. There is no necrotic tissue  within the wound bed. Assessment Active Problems ICD-10 Type 2 diabetes mellitus with foot ulcer Non-pressure chronic ulcer of other part of right foot with necrosis of bone Disruption of external operation (surgical) wound, not elsewhere classified, sequela Other chronic osteomyelitis, right ankle and foot Plan Follow-up Appointments: Return Appointment in 2 weeks. - with Dr. Dellia Nims Bathing/ Shower/ Hygiene: May shower and wash wound with soap and water. - on days you change dressing Edema Control - Lymphedema / SCD / Other: Avoid standing for long periods of time. Exercise regularly Off-Loading: Open toe surgical shoe to: - Wear at all times except driving. Additional Orders / Instructions: Follow Nutritious Diet Non Wound Condition: Other Non Wound Condition Orders/Instructions: - cushion bottom of left foot Home Health: No change in wound care orders this week; continue Home Health for wound care. May utilize formulary equivalent dressing for wound treatment orders unless otherwise specified. Other Home Health Orders/Instructions: - Advanced WOUND #1: - Amputation Site - Transmetatarsal Wound Laterality: Right Cleanser: Soap and Water [facility]) Every Other Day/30 Days Discharge Instructions: May shower and wash wound with dial antibacterial soap and water prior to dressing change. Cleanser: Wound Cleanser Every Other Day/30 Days Discharge Instructions: Cleanse the wound with wound cleanser prior to applying a clean dressing using gauze sponges, not tissue or cotton balls. Prim Dressing: KerraCel Ag Gelling Fiber Dressing, 4x5 in (silver alginate) (Home Health) Every Other Day/30 Days ary Discharge Instructions: Pack up into undermined area Secondary Dressing: Woven Gauze Sponge, Non-Sterile 4x4 in [facility]) Every Other Day/30 Days Discharge Instructions: Apply over primary dressing as directed. Secondary Dressing: ABD Pad, 5x9 [facility]) Every Other Day/30  Days Discharge Instructions: Apply over primary dressing as directed. Secured With: [employer], 4.5x3.1 (in/yd) [facility]) Every Other Day/30 Days Discharge Instructions: Secure with Kerlix  as directed. Secured With: Transpore Surgical T ape, 2x10 (in/yd) (Home Health) Every Other Day/30 Days Discharge Instructions: Secure dressing with tape as directed. 1. For now I am simply going to watch this. I wonder if this is going to hold together or it might reopen again. There was no drainage nothing that I could really identify probe deeply. I have continued with the silver alginate. Explained this approach to the patient Electronic Signature(s) Signed: 09/24/2020 4:58:19 PM By: [name] MD Entered By: [name] on 09/24/2020 10:31:32 -------------------------------------------------------------------------------- SuperBill Details Patient Name: Date of Service: [name], [name] 09/24/2020 Medical Record Number: [id number] Patient Account Number: [id number] Date of Birth/Sex: Treating RN: 11-03-1970 (50 y.o. [name] Primary Care Provider: [name] Other Clinician: Referring Provider: Treating Provider/Extender: Lenetta Quaker in Treatment: 2 Diagnosis Coding ICD-10 Codes Code Description E11.621 Type 2 diabetes mellitus with foot ulcer L97.514 Non-pressure chronic ulcer of other part of right foot with necrosis of bone T81.31XS Disruption of external operation (surgical) wound, not elsewhere classified, sequela M86.671 Other chronic osteomyelitis, right ankle and foot Facility Procedures CPT4 Code: [id number] Description: 99213 - WOUND CARE VISIT-LEV 3 EST PT Modifier: Quantity: 1 Physician Procedures Electronic Signature(s) Signed: 09/24/2020 4:56:05 PM By: Levan Hurst RN, BSN Signed: 09/24/2020 4:58:19 PM By: [name] MD Entered By: Levan Hurst on 09/24/2020 16:46:28

## 2020-09-25 ENCOUNTER — Other Ambulatory Visit: Payer: Self-pay

## 2020-10-01 ENCOUNTER — Other Ambulatory Visit: Payer: Self-pay

## 2020-10-08 ENCOUNTER — Other Ambulatory Visit: Payer: Self-pay

## 2020-10-08 ENCOUNTER — Telehealth: Payer: Self-pay | Admitting: Nurse Practitioner

## 2020-10-08 ENCOUNTER — Encounter (HOSPITAL_BASED_OUTPATIENT_CLINIC_OR_DEPARTMENT_OTHER): Payer: Medicaid Other | Attending: Internal Medicine | Admitting: Internal Medicine

## 2020-10-08 ENCOUNTER — Inpatient Hospital Stay: Payer: Medicaid Other

## 2020-10-08 ENCOUNTER — Inpatient Hospital Stay: Payer: Medicaid Other | Admitting: Physician Assistant

## 2020-10-08 ENCOUNTER — Ambulatory Visit: Payer: Medicaid Other | Admitting: Registered"

## 2020-10-08 DIAGNOSIS — T8781 Dehiscence of amputation stump: Secondary | ICD-10-CM | POA: Insufficient documentation

## 2020-10-08 DIAGNOSIS — I129 Hypertensive chronic kidney disease with stage 1 through stage 4 chronic kidney disease, or unspecified chronic kidney disease: Secondary | ICD-10-CM | POA: Diagnosis not present

## 2020-10-08 DIAGNOSIS — F17210 Nicotine dependence, cigarettes, uncomplicated: Secondary | ICD-10-CM | POA: Diagnosis not present

## 2020-10-08 DIAGNOSIS — E11621 Type 2 diabetes mellitus with foot ulcer: Secondary | ICD-10-CM | POA: Insufficient documentation

## 2020-10-08 DIAGNOSIS — N183 Chronic kidney disease, stage 3 unspecified: Secondary | ICD-10-CM | POA: Insufficient documentation

## 2020-10-08 DIAGNOSIS — L97514 Non-pressure chronic ulcer of other part of right foot with necrosis of bone: Secondary | ICD-10-CM | POA: Diagnosis not present

## 2020-10-08 DIAGNOSIS — L84 Corns and callosities: Secondary | ICD-10-CM | POA: Diagnosis not present

## 2020-10-08 DIAGNOSIS — E1169 Type 2 diabetes mellitus with other specified complication: Secondary | ICD-10-CM | POA: Insufficient documentation

## 2020-10-08 DIAGNOSIS — M86671 Other chronic osteomyelitis, right ankle and foot: Secondary | ICD-10-CM | POA: Diagnosis not present

## 2020-10-08 DIAGNOSIS — E1122 Type 2 diabetes mellitus with diabetic chronic kidney disease: Secondary | ICD-10-CM | POA: Insufficient documentation

## 2020-10-08 DIAGNOSIS — Y835 Amputation of limb(s) as the cause of abnormal reaction of the patient, or of later complication, without mention of misadventure at the time of the procedure: Secondary | ICD-10-CM | POA: Diagnosis not present

## 2020-10-08 NOTE — Telephone Encounter (Signed)
Amy was called and given verbal orders for patient.

## 2020-10-08 NOTE — Telephone Encounter (Signed)
Copied from [place] [id number]. Topic: General - Other >> Oct 08, 2020  8:53 AM Oneta Rack wrote: Caller name: Amy  Relation to pt: RN from [place]  Call back number: [phone]    Reason for call:  Verbal orders for nursing wound care 2x 9

## 2020-10-09 NOTE — Progress Notes (Signed)
KEYSEAN, SAVINO ([id number]) Visit Report for 10/08/2020 Debridement Details Patient Name: Date of Service: [place] [address], [name] 10/08/2020 10:00 A M Medical Record Number: [id number] Patient Account Number: [id number] Date of Birth/Sex: Treating RN: Feb 01, 1971 (50 y.o. [name] Primary Care Provider: [name] Other Clinician: Referring Provider: Treating Provider/Extender: Lenetta Quaker in Treatment: 4 Debridement Performed for Assessment: Wound #1 Right Amputation Site - Transmetatarsal Performed By: Physician Ricard Dillon., MD Debridement Type: Debridement Severity of Tissue Pre Debridement: Fat layer exposed Level of Consciousness (Pre-procedure): Awake and Alert Pre-procedure Verification/Time Out Yes - 10:30 Taken: Start Time: 10:30 T Area Debrided (L x W): otal 1 (cm) x 1 (cm) = 1 (cm) Tissue and other material debrided: Viable, Non-Viable, Callus, Subcutaneous Level: Skin/Subcutaneous Tissue Debridement Description: Excisional Instrument: Blade, Curette, Forceps Bleeding: Minimum Hemostasis Achieved: Pressure End Time: 10:32 Procedural Pain: 0 Post Procedural Pain: 0 Response to Treatment: Procedure was tolerated well Level of Consciousness (Post- Awake and Alert procedure): Post Debridement Measurements of Total Wound Length: (cm) 0.2 Width: (cm) 0.2 Depth: (cm) 0.8 Volume: (cm) 0.025 Character of Wound/Ulcer Post Debridement: Stable Severity of Tissue Post Debridement: Fat layer exposed Post Procedure Diagnosis Same as Pre-procedure Electronic Signature(s) Signed: 10/08/2020 5:03:28 PM By: [name] MD Signed: 10/09/2020 6:01:10 PM By: Levan Hurst RN, BSN Entered By: [name] on 10/08/2020 10:39:24 -------------------------------------------------------------------------------- HPI Details Patient Name: Date of Service: [name], [name] 10/08/2020 10:00 A M Medical Record Number:  [id number] Patient Account Number: [id number] Date of Birth/Sex: Treating RN: [birth year] (50 y.o. [name] Primary Care Provider: [name] Other Clinician: Referring Provider: Treating Provider/Extender: Lenetta Quaker in Treatment: 4 History of Present Illness HPI Description: ADMISSION 09/05/2020 This is a 50 year old man who has type 2 diabetes. Essentially the problem began admitted to [facility] health in December with infection gas gangrene. Cultures ultimately grew staph lugdunensis. 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 March Rummage. Shortly thereafter he developed a nonhealing wound with wound dehiscence noted on 05/09/2020. He had a wound VAC I think for a period of time after the surgery but it did not help. He has undergone an IandD and skin graft on the right foot on 05/16/2020. He underwent a further IandD on 06/04/2020 not debrided to bone. 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 Culture of the metatarsal grew Enterobacter cloacae I. He has been followed by Dr. [name] of infectious disease. Currently on Levaquin and doxycycline I think at about the 4-week of 6 mark. At the last visit with Dr. March Rummage he was supposed to have use Santyl although I do not think he has it and he has been using I think a Betadine wet-to-dry. He is referred here by Dr. [name] for our review who saw him yesterday. I cannot see that he has had imaging studies of the right foot. No MRI Past medical history includes type 2 diabetes, in 2021 and ulcer of the left foot that ultimately healed with a wound VAC, stage III chronic kidney disease, first toe amputations bilaterally, hypertension, still a 1/2 pack/day smoker. The patient had ABIs on 08/09/2018 which was 1.00 on the right and 0.96 on the left both areas had triphasic waveforms. Not felt to have an arterial  issue. 09/12/2020; 1 week follow-up. He has been doing the dressings along with home health. This is a deep punched out area  in the incision of his transmit site. We use silver alginate last week to help with drainage and antibacterial properties. He tolerated this well 6/24; the patient was actually at his podiatrist this morning who debrided the wound. We have been using silver alginate. They are going to follow him in 6 weeks. 6/29; patient is using silver alginate. Apparently saw Dr. March Rummage this week although I have not checked his note I will try to do so. He is following up in 6 weeks. This was the original transmetatarsal amputation when I first saw this 3 mid part of his amputation site was deep down to bone however this seems to have progressively close down which is gratifying to see. He still has a fairly callus uneven surface however spreading this apart its difficult to identify anything that does not look epithelialized. When I first saw this I thought he would require an extensive debridement perhaps back in the OR by Dr. March Rummage however all of this appears to be looking better and at this point I would continue with the silver alginate see if this holds together over the next several weeks. He does not have an arterial issue 7/13; 2-week follow-up. Unfortunately the area did not hold together there was drainage on his dressing. Still copious amounts of callus. It was difficult to see where this actually was open therefore I elected to go ahead with a vigorous debridement. Still using silver alginate Electronic Signature(s) Signed: 10/08/2020 5:03:28 PM By: [name] MD Entered By: [name] on 10/08/2020 10:40:06 -------------------------------------------------------------------------------- Physical Exam Details Patient Name: Date of Service: [name], [name] 10/08/2020 10:00 A M Medical Record Number: [id number] Patient Account Number: [id number] Date of Birth/Sex:  Treating RN: 11-12-1970 (50 y.o. [name] Primary Care Provider: [name] Other Clinician: Referring Provider: Treating Provider/Extender: Tomasa Blase, [name] in Treatment: 4 Constitutional Sitting or standing Blood Pressure is within target range for patient.. Pulse regular and within target range for patient.Marland Kitchen Respirations regular, non-labored and within target range.. Temperature is normal and within the target range for the patient.Marland Kitchen Appears in no distress. Notes Wound exam; transmetatarsal amputation site; thick callus and subcutaneous tissue. I used a #15 scalpel and pickups to remove copious amounts of callus from the surface of this. Fortunately there is not too many areas that looked like they were substantially open I was eventually able to identify for open areas including 1 crevice. Nothing probe to bone there was no evidence of purulence. Electronic Signature(s) Signed: 10/08/2020 5:03:28 PM By: [name] MD Entered By: [name] on 10/08/2020 10:41:22 -------------------------------------------------------------------------------- Physician Orders Details Patient Name: Date of Service: [name], [place] 10/08/2020 10:00 A M Medical Record Number: [id number] Patient Account Number: [id number] Date of Birth/Sex: Treating RN: 05-05-1970 (50 y.o. [name] Primary Care Provider: Other Clinician: [name] Referring Provider: Treating Provider/Extender: Lenetta Quaker in Treatment: 4 Verbal / Phone Orders: No Diagnosis Coding ICD-10 Coding Code Description E11.621 Type 2 diabetes mellitus with foot ulcer L97.514 Non-pressure chronic ulcer of other part of right foot with necrosis of bone T81.31XS Disruption of external operation (surgical) wound, not elsewhere classified, sequela M86.671 Other chronic osteomyelitis, right ankle and foot Follow-up Appointments ppointment in 1 week. - Dr.  Dellia Nims Return A Bathing/ Shower/ Hygiene May shower and wash wound with soap and water. - on days you change dressing Edema Control - Lymphedema / SCD / Other Avoid standing for long periods of time. Exercise regularly Off-Loading  Open toe surgical shoe to: - Wear at all times except driving. Additional Orders / Instructions Follow Nutritious Diet Non Wound Condition Other Non Wound Condition Orders/Instructions: - cushion bottom of left foot Home Health No change in wound care orders this week; continue Home Health for wound care. May utilize formulary equivalent dressing for wound treatment orders unless otherwise specified. Other Home Health Orders/Instructions: - Advanced Wound Treatment Wound #1 - Amputation Site - Transmetatarsal Wound Laterality: Right Cleanser: Soap and Water [facility]) Every Other Day/30 Days Discharge Instructions: May shower and wash wound with dial antibacterial soap and water prior to dressing change. Cleanser: Wound Cleanser Every Other Day/30 Days Discharge Instructions: Cleanse the wound with wound cleanser prior to applying a clean dressing using gauze sponges, not tissue or cotton balls. Prim Dressing: KerraCel Ag Gelling Fiber Dressing, 4x5 in (silver alginate) (Home Health) Every Other Day/30 Days ary Discharge Instructions: Pack up into undermined area Secondary Dressing: Woven Gauze Sponge, Non-Sterile 4x4 in [facility]) Every Other Day/30 Days Discharge Instructions: Apply over primary dressing as directed. Secondary Dressing: ABD Pad, 5x9 [facility]) Every Other Day/30 Days Discharge Instructions: Apply over primary dressing as directed. Secured With: [employer], 4.5x3.1 (in/yd) [facility]) Every Other Day/30 Days Discharge Instructions: Secure with Kerlix as directed. Secured With: Transpore Surgical Tape, 2x10 (in/yd) [facility]) Every Other Day/30 Days Discharge Instructions: Secure dressing with tape as  directed. Electronic Signature(s) Signed: 10/08/2020 5:03:28 PM By: [name] MD Signed: 10/09/2020 6:01:10 PM By: Levan Hurst RN, BSN Entered By: Levan Hurst on 10/08/2020 10:42:34 -------------------------------------------------------------------------------- Problem List Details Patient Name: Date of Service: [name], [name] 10/08/2020 10:00 A M Medical Record Number: [id number] Patient Account Number: [id number] Date of Birth/Sex: Treating RN: 04/28/1970 (50 y.o. [name] Primary Care Provider: [name] Other Clinician: Referring Provider: Treating Provider/Extender: Lenetta Quaker in Treatment: 4 Active Problems ICD-10 Encounter Code Description Active Date MDM Diagnosis E11.621 Type 2 diabetes mellitus with foot ulcer 09/05/2020 No Yes L97.514 Non-pressure chronic ulcer of other part of right foot with necrosis of bone 09/05/2020 No Yes T81.31XS Disruption of external operation (surgical) wound, not elsewhere classified, 09/05/2020 No Yes sequela M86.671 Other chronic osteomyelitis, right ankle and foot 09/05/2020 No Yes Inactive Problems Resolved Problems Electronic Signature(s) Signed: 10/08/2020 5:03:28 PM By: [name] MD Entered By: [name] on 10/08/2020 10:39:02 -------------------------------------------------------------------------------- Progress Note Details Patient Name: Date of Service: [name], [name] 10/08/2020 10:00 A M Medical Record Number: [id number] Patient Account Number: [id number] Date of Birth/Sex: Treating RN: 19-Oct-1970 (50 y.o. [name] Primary Care Provider: [name] Other Clinician: Referring Provider: Treating Provider/Extender: Tomasa Blase, [name] in Treatment: 4 Subjective History of Present Illness (HPI) ADMISSION 09/05/2020 This is a 50 year old man who has type 2 diabetes. Essentially the problem began admitted to [facility] health in  December with infection gas gangrene. Cultures ultimately grew staph lugdunensis. 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 March Rummage. Shortly thereafter he developed a nonhealing wound with wound dehiscence noted on 05/09/2020. He had a wound VAC I think for a period of time after the surgery but it did not help. He has undergone an IandD and skin graft on the right foot on 05/16/2020. He underwent a further IandD on 06/04/2020 not debrided to bone. 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 Culture of the metatarsal grew Enterobacter cloacae  I. He has been followed by Dr. [name] of infectious disease. Currently on Levaquin and doxycycline I think at about the 4-week of 6 mark. At the last visit with Dr. March Rummage he was supposed to have use Santyl although I do not think he has it and he has been using I think a Betadine wet-to-dry. He is referred here by Dr. [name] for our review who saw him yesterday. I cannot see that he has had imaging studies of the right foot. No MRI Past medical history includes type 2 diabetes, in 2021 and ulcer of the left foot that ultimately healed with a wound VAC, stage III chronic kidney disease, first toe amputations bilaterally, hypertension, still a 1/2 pack/day smoker. The patient had ABIs on 08/09/2018 which was 1.00 on the right and 0.96 on the left both areas had triphasic waveforms. Not felt to have an arterial issue. 09/12/2020; 1 week follow-up. He has been doing the dressings along with home health. This is a deep punched out area in the incision of his transmit site. We use silver alginate last week to help with drainage and antibacterial properties. He tolerated this well 6/24; the patient was actually at his podiatrist this morning who debrided the wound. We have been using silver alginate. They are going to follow him in 6 weeks. 6/29; patient is using  silver alginate. Apparently saw Dr. March Rummage this week although I have not checked his note I will try to do so. He is following up in 6 weeks. This was the original transmetatarsal amputation when I first saw this 3 mid part of his amputation site was deep down to bone however this seems to have progressively close down which is gratifying to see. He still has a fairly callus uneven surface however spreading this apart its difficult to identify anything that does not look epithelialized. When I first saw this I thought he would require an extensive debridement perhaps back in the OR by Dr. March Rummage however all of this appears to be looking better and at this point I would continue with the silver alginate see if this holds together over the next several weeks. He does not have an arterial issue 7/13; 2-week follow-up. Unfortunately the area did not hold together there was drainage on his dressing. Still copious amounts of callus. It was difficult to see where this actually was open therefore I elected to go ahead with a vigorous debridement. Still using silver alginate Objective Constitutional Sitting or standing Blood Pressure is within target range for patient.. Pulse regular and within target range for patient.Marland Kitchen Respirations regular, non-labored and within target range.. Temperature is normal and within the target range for the patient.Marland Kitchen Appears in no distress. Vitals Time Taken: 10:24 AM, Height: 64 in, Weight: 188 lbs, BMI: 32.3, Temperature: 97.9 F, Pulse: 80 bpm, Respiratory Rate: 16 breaths/min, Blood Pressure: 139/85 mmHg, Capillary Blood Glucose: 220 mg/dl. General Notes: glucose per pt report General Notes: Wound exam; transmetatarsal amputation site; thick callus and subcutaneous tissue. I used a #15 scalpel and pickups to remove copious amounts of callus from the surface of this. Fortunately there is not too many areas that looked like they were substantially open I was eventually able  to identify for open areas including 1 crevice. Nothing probe to bone there was no evidence of purulence. Integumentary (Hair, Skin) Wound #1 status is Open. Original cause of wound was Surgical Injury. The date acquired was: 02/28/2020. The wound has been in treatment 4 weeks. The wound is located  on the Right Amputation Site - Transmetatarsal. The wound measures 0.2cm length x 0.2cm width x 0.8cm depth; 0.031cm^2 area and 0.025cm^3 volume. There is Fat Layer (Subcutaneous Tissue) exposed. There is no tunneling or undermining noted. There is a medium amount of serosanguineous drainage noted. The wound margin is distinct with the outline attached to the wound base. There is large (67-100%) pink, pale granulation within the wound bed. There is no necrotic tissue within the wound bed. Assessment Active Problems ICD-10 Type 2 diabetes mellitus with foot ulcer Non-pressure chronic ulcer of other part of right foot with necrosis of bone Disruption of external operation (surgical) wound, not elsewhere classified, sequela Other chronic osteomyelitis, right ankle and foot Procedures Wound #1 Pre-procedure diagnosis of Wound #1 is a Diabetic Wound/Ulcer of the Lower Extremity located on the Right Amputation Site - Transmetatarsal .Severity of Tissue Pre Debridement is: Fat layer exposed. There was a Excisional Skin/Subcutaneous Tissue Debridement with a total area of 1 sq cm performed by Ricard Dillon., MD. With the following instrument(s): Blade, Curette, and Forceps to remove Viable and Non-Viable tissue/material. Material removed includes Callus and Subcutaneous Tissue and. No specimens were taken. A time out was conducted at 10:30, prior to the start of the procedure. A Minimum amount of bleeding was controlled with Pressure. The procedure was tolerated well with a pain level of 0 throughout and a pain level of 0 following the procedure. Post Debridement Measurements: 0.2cm length x 0.2cm width x  0.8cm depth; 0.025cm^3 volume. Character of Wound/Ulcer Post Debridement is stable. Severity of Tissue Post Debridement is: Fat layer exposed. Post procedure Diagnosis Wound #1: Same as Pre-Procedure Plan Follow-up Appointments: Return Appointment in 2 weeks. - with Dr. Dellia Nims Bathing/ Shower/ Hygiene: May shower and wash wound with soap and water. - on days you change dressing Edema Control - Lymphedema / SCD / Other: Avoid standing for long periods of time. Exercise regularly Off-Loading: Open toe surgical shoe to: - Wear at all times except driving. Additional Orders / Instructions: Follow Nutritious Diet Non Wound Condition: Other Non Wound Condition Orders/Instructions: - cushion bottom of left foot Home Health: No change in wound care orders this week; continue Home Health for wound care. May utilize formulary equivalent dressing for wound treatment orders unless otherwise specified. Other Home Health Orders/Instructions: - Advanced WOUND #1: - Amputation Site - Transmetatarsal Wound Laterality: Right Cleanser: Soap and Water [facility]) Every Other Day/30 Days Discharge Instructions: May shower and wash wound with dial antibacterial soap and water prior to dressing change. Cleanser: Wound Cleanser Every Other Day/30 Days Discharge Instructions: Cleanse the wound with wound cleanser prior to applying a clean dressing using gauze sponges, not tissue or cotton balls. Prim Dressing: KerraCel Ag Gelling Fiber Dressing, 4x5 in (silver alginate) (Home Health) Every Other Day/30 Days ary Discharge Instructions: Pack up into undermined area Secondary Dressing: Woven Gauze Sponge, Non-Sterile 4x4 in [facility]) Every Other Day/30 Days Discharge Instructions: Apply over primary dressing as directed. Secondary Dressing: ABD Pad, 5x9 [facility]) Every Other Day/30 Days Discharge Instructions: Apply over primary dressing as directed. Secured With: [employer], 4.5x3.1 (in/yd)  [facility]) Every Other Day/30 Days Discharge Instructions: Secure with Kerlix as directed. Secured With: Transpore Surgical T ape, 2x10 (in/yd) (Home Health) Every Other Day/30 Days Discharge Instructions: Secure dressing with tape as directed. #1 after extensive debridement of this area I am going to continue to use silver alginate. 2. Fortunately this did not identify any deep probing openings to bone.  At 1 point this did open to bone in quite a wide area 3. I did not see anything that was worth culturing 4. Follow-up next week Electronic Signature(s) Signed: 10/08/2020 5:03:28 PM By: [name] MD Entered By: [name] on 10/08/2020 10:42:52 -------------------------------------------------------------------------------- SuperBill Details Patient Name: Date of Service: [name], [name] 10/08/2020 Medical Record Number: [id number] Patient Account Number: [id number] Date of Birth/Sex: Treating RN: 05/11/1970 (50 y.o. [name] Primary Care Provider: [name] Other Clinician: Referring Provider: Treating Provider/Extender: Lenetta Quaker in Treatment: 4 Diagnosis Coding ICD-10 Codes Code Description [phone] Type 2 diabetes mellitus with foot ulcer L97.514 Non-pressure chronic ulcer of other part of right foot with necrosis of bone T81.31XS Disruption of external operation (surgical) wound, not elsewhere classified, sequela M86.671 Other chronic osteomyelitis, right ankle and foot Facility Procedures CPT4 Code: [id number] T Description: 11042 - DEB SUBQ TISSUE 20 SQ CM/< ICD-10 Diagnosis Description L97.514 Non-pressure chronic ulcer of other part of right foot with necrosis of bone 81.31XS Disruption of external operation (surgical) wound, not elsewhere classified, sequela Modifier: Quantity: 1 Physician Procedures : CPT4 Code Description Modifier [id number] 11042 - WC PHYS SUBQ TISS 20 SQ CM ICD-10 Diagnosis Description L97.514  Non-pressure chronic ulcer of other part of right foot with necrosis of bone T81.31XS Disruption of external operation (surgical) wound, not  elsewhere classified, sequela Quantity: 1 Electronic Signature(s) Signed: 10/08/2020 5:03:28 PM By: [name] MD Entered By: [name] on 10/08/2020 10:43:08

## 2020-10-13 ENCOUNTER — Other Ambulatory Visit: Payer: Self-pay | Admitting: Physician Assistant

## 2020-10-13 DIAGNOSIS — D649 Anemia, unspecified: Secondary | ICD-10-CM

## 2020-10-13 NOTE — Progress Notes (Signed)
[name], [name] ([id number]) Visit Report for 10/08/2020 Arrival Information Details Patient Name: Date of Service: [place] RD, [name] 10/08/2020 10:00 A M Medical Record Number: [id number] Patient Account Number: [id number] Date of Birth/Sex: Treating RN: [birth year] (50 y.o. [name] Primary Care Elyna Pangilinan: [name] Other Clinician: Referring Thomasa Heidler: Treating Davied Nocito/Extender: Lenetta Quaker in Treatment: 4 Visit Information History Since Last Visit Added or deleted any medications: No Patient Arrived: Ambulatory Any new allergies or adverse reactions: No Arrival Time: 10:24 Had a fall or experienced change in No Accompanied By: alone activities of daily living that may affect Transfer Assistance: None risk of falls: Patient Identification Verified: Yes Signs or symptoms of abuse/neglect since last visito No Secondary Verification Process Completed: Yes Hospitalized since last visit: No Patient Requires Transmission-Based Precautions: No Implantable device outside of the clinic excluding No Patient Has Alerts: No cellular tissue based products placed in the center since last visit: Has Dressing in Place as Prescribed: Yes Has Compression in Place as Prescribed: Yes Pain Present Now: No Electronic Signature(s) Signed: 10/09/2020 6:01:10 PM By: Levan Hurst RN, BSN Entered By: Levan Hurst on 10/08/2020 10:24:35 -------------------------------------------------------------------------------- Encounter Discharge Information Details Patient Name: Date of Service: [name], [name] 10/08/2020 10:00 A M Medical Record Number: [id number] Patient Account Number: [id number] Date of Birth/Sex: Treating RN: 27-Jan-1971 (50 y.o. [name] Primary Care Shanieka Blea: [name] Other Clinician: Referring Lucian Baswell: Treating Areen Trautner/Extender: Lenetta Quaker in Treatment: 4 Encounter Discharge Information Items  Post Procedure Vitals Discharge Condition: Stable Temperature (F): 97.9 Ambulatory Status: Cane Pulse (bpm): 80 Discharge Destination: Home Respiratory Rate (breaths/min): 16 Transportation: Private Auto Blood Pressure (mmHg): 139/85 Accompanied By: alone Schedule Follow-up Appointment: Yes Clinical Summary of Care: Patient Declined Electronic Signature(s) Signed: 10/09/2020 6:01:10 PM By: Levan Hurst RN, BSN Entered By: Levan Hurst on 10/08/2020 15:28:08 -------------------------------------------------------------------------------- Lower Extremity Assessment Details Patient Name: Date of Service: [name], [name] 10/08/2020 10:00 A M Medical Record Number: [id number] Patient Account Number: [id number] Date of Birth/Sex: Treating RN: 02-Jan-1971 (50 y.o. [name] Primary Care Mahkai Fangman: [name] Other Clinician: Referring Kymora Sciara: Treating Shameca Landen/Extender: [name], [name] in Treatment: 4 Edema Assessment Assessed: [Left: No] [Right: No] Edema: [Left: N] [Right: o] Calf Left: Right: Point of Measurement: From Medial Instep 34.5 cm Ankle Left: Right: Point of Measurement: From Medial Instep 23.5 cm Vascular Assessment Pulses: Dorsalis Pedis Palpable: [Right:Yes] Electronic Signature(s) Signed: 10/09/2020 6:01:10 PM By: Levan Hurst RN, BSN Entered By: Levan Hurst on 10/08/2020 10:29:07 -------------------------------------------------------------------------------- Multi Wound Chart Details Patient Name: Date of Service: [name], [name] 10/08/2020 10:00 A M Medical Record Number: [id number] Patient Account Number: [id number] Date of Birth/Sex: Treating RN: 05-19-1970 (50 y.o. [name] Primary Care Zariah Jost: [name] Other Clinician: Referring Zacharius Funari: Treating Nadea Kirkland/Extender: [name], [name] in Treatment: 4 Vital Signs Height(in): 64 Capillary Blood Glucose(mg/dl):  220 Weight(lbs): 188 Pulse(bpm): 80 Body Mass Index(BMI): 32 Blood Pressure(mmHg): 139/85 Temperature(F): 97.9 Respiratory Rate(breaths/min): 16 Photos: [1:No Photos Right Amputation Site -] [N/A:N/A N/A] Wound Location: [1:Transmetatarsal Surgical Injury] [N/A:N/A] Wounding Event: [1:Diabetic Wound/Ulcer of the Lower] [N/A:N/A] Primary Etiology: [1:Extremity Open Surgical Wound] [N/A:N/A] Secondary Etiology: [1:Anemia, Coronary Artery Disease,] [N/A:N/A] Comorbid History: [1:Hypertension, Type II Diabetes, Osteomyelitis, Neuropathy 02/28/2020] [N/A:N/A] Date Acquired: [1:4] [N/A:N/A] Weeks of Treatment: [1:Open] [N/A:N/A] Wound Status: [1:0.2x0.2x0.8] [N/A:N/A] Measurements L x W x D (cm) [1:0.031] [N/A:N/A] A (cm) : rea [1:0.025] [N/A:N/A] Volume (cm) : [1:97.40%] [N/A:N/A] %  Reduction in A rea: [1:97.40%] [N/A:N/A] % Reduction in Volume: [1:Grade 3] [N/A:N/A] Classification: [1:Medium] [N/A:N/A] Exudate A mount: [1:Serosanguineous] [N/A:N/A] Exudate Type: [1:red, brown] [N/A:N/A] Exudate Color: [1:Distinct, outline attached] [N/A:N/A] Wound Margin: [1:Large (67-100%)] [N/A:N/A] Granulation A mount: [1:Pink, Pale] [N/A:N/A] Granulation Quality: [1:None Present (0%)] [N/A:N/A] Necrotic A mount: [1:Fat Layer (Subcutaneous Tissue): Yes N/A] Exposed Structures: [1:Fascia: No Tendon: No Muscle: No Joint: No Bone: No Medium (34-66%)] [N/A:N/A] Epithelialization: [1:Debridement - Excisional] [N/A:N/A] Debridement: Pre-procedure Verification/Time Out 10:30 [N/A:N/A] Taken: [1:Callus, Subcutaneous] [N/A:N/A] Tissue Debrided: [1:Skin/Subcutaneous Tissue] [N/A:N/A] Level: [1:1] [N/A:N/A] Debridement A (sq cm): [1:rea Blade, Curette, Forceps] [N/A:N/A] Instrument: [1:Minimum] [N/A:N/A] Bleeding: [1:Pressure] [N/A:N/A] Hemostasis A chieved: [1:0] [N/A:N/A] Procedural Pain: [1:0] [N/A:N/A] Post Procedural Pain: [1:Procedure was tolerated well] [N/A:N/A] Debridement Treatment  Response: [1:0.2x0.2x0.8] [N/A:N/A] Post Debridement Measurements L x W x D (cm) [1:0.025] [N/A:N/A] Post Debridement Volume: (cm) [1:Debridement] [N/A:N/A] Treatment Notes Electronic Signature(s) Signed: 10/08/2020 5:03:28 PM By: [name] MD Signed: 10/09/2020 6:01:10 PM By: Levan Hurst RN, BSN Entered By: [name] on 10/08/2020 10:39:13 -------------------------------------------------------------------------------- Multi-Disciplinary Care Plan Details Patient Name: Date of Service: [name], [name] 10/08/2020 10:00 A M Medical Record Number: [id number] Patient Account Number: [id number] Date of Birth/Sex: Treating RN: 1970-05-18 (50 y.o. [name] Primary Care Robi Mitter: [name] Other Clinician: Referring Stefanee Mckell: Treating [name]/Extender: [name], [name] in Treatment: 4 Active Inactive Wound/Skin Impairment Nursing Diagnoses: Impaired tissue integrity Goals: Patient/caregiver will verbalize understanding of skin care regimen Date Initiated: 09/05/2020 Target Resolution Date: 11/07/2020 Goal Status: Active Ulcer/skin breakdown will have a volume reduction of 30% by week 4 Date Initiated: 09/05/2020 Date Inactivated: 10/08/2020 Target Resolution Date: 10/03/2020 Goal Status: Met Interventions: Assess patient/caregiver ability to obtain necessary supplies Assess patient/caregiver ability to perform ulcer/skin care regimen upon admission and as needed Assess ulceration(s) every visit Provide education on ulcer and skin care Treatment Activities: Patient referred to home care : 09/05/2020 Skin care regimen initiated : 09/05/2020 Topical wound management initiated : 09/05/2020 Notes: Electronic Signature(s) Signed: 10/09/2020 6:01:10 PM By: Levan Hurst RN, BSN Entered By: Levan Hurst on 10/08/2020 10:34:22 -------------------------------------------------------------------------------- Pain Assessment  Details Patient Name: Date of Service: [name], [name] 10/08/2020 10:00 A M Medical Record Number: [id number] Patient Account Number: [id number] Date of Birth/Sex: Treating RN: 1971/01/14 (50 y.o. [name] Primary Care Annabelle Rexroad: [name] Other Clinician: Referring Windsor Zirkelbach: Treating Astra Gregg/Extender: Lenetta Quaker in Treatment: 4 Active Problems Location of Pain Severity and Description of Pain Patient Has Paino No Site Locations Pain Management and Medication Current Pain Management: Electronic Signature(s) Signed: 10/09/2020 6:01:10 PM By: Levan Hurst RN, BSN Entered By: Levan Hurst on 10/08/2020 10:29:01 -------------------------------------------------------------------------------- Patient/Caregiver Education Details Patient Name: Date of Service: [name], [place] 7/13/2022andnbsp10:00 [place] Record Number: [id number] Patient Account Number: [id number] Date of Birth/Gender: Treating RN: [birth year] (50 y.o. [name] Primary Care Physician: [name] Other Clinician: Referring Physician: Treating Physician/Extender: Lenetta Quaker in Treatment: 4 Education Assessment Education Provided To: Patient Education Topics Provided Wound/Skin Impairment: Methods: Explain/Verbal Responses: State content correctly Electronic Signature(s) Signed: 10/09/2020 6:01:10 PM By: Levan Hurst RN, BSN Entered By: Levan Hurst on 10/08/2020 10:34:33 -------------------------------------------------------------------------------- Wound Assessment Details Patient Name: Date of Service: [name], [name] 10/08/2020 10:00 A M Medical Record Number: [id number] Patient Account Number: [id number] Date of Birth/Sex: Treating RN: Feb 21, 1971 (50 y.o. 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 Secondary Open Surgical Wound Wounding Event: Surgical Injury Etiology: Date Acquired: 02/28/2020 Wound Open Weeks Of Treatment: 4 Status: Clustered Wound: No Comorbid Anemia, Coronary Artery Disease, Hypertension, Type II History: Diabetes, Osteomyelitis, Neuropathy Photos Wound Measurements Length: (cm) 1 Width: (cm) 4 Depth: (cm) 0.8 Area: (cm) 3.142 Volume: (cm) 2.513 Wound Description Classification: Grade 3 Wound Margin: Distinct, outline attached Exudate Amount: Medium Exudate Type: Serosanguineous Exudate Color: red, brown Foul Odor After Cleansing: Slough/Fibrino % Reduction in Area: -159.7% % Reduction in Volume: -159.6% Epithelialization: Medium (34-66%) Tunneling: No Undermining: No No No Wound Bed Granulation Amount: Large (67-100%) Exposed Structure Granulation Quality: Pink, Pale Fascia Exposed: No Necrotic Amount: None Present (0%) Fat Layer (Subcutaneous Tissue) Exposed: Yes Tendon Exposed: No Muscle Exposed: No Joint Exposed: No Bone Exposed: No Treatment Notes Wound #1 (Amputation Site - Transmetatarsal) Wound Laterality: Right Cleanser Soap and Water Discharge Instruction: May shower and wash wound with dial antibacterial soap and water prior to dressing change. Wound Cleanser Discharge Instruction: Cleanse the wound with wound cleanser prior to applying a clean dressing using gauze sponges, not tissue or cotton balls. Peri-Wound Care Topical Primary Dressing KerraCel Ag Gelling Fiber Dressing, 4x5 in (silver alginate) Discharge Instruction: Pack up into undermined area Secondary Dressing Woven Gauze Sponge, Non-Sterile 4x4 in Discharge Instruction: Apply over primary dressing as directed. ABD Pad, 5x9 Discharge Instruction: Apply over primary dressing as  directed. Secured With [employer], 4.5x3.1 (in/yd) Discharge Instruction: Secure with Kerlix as directed. Transpore Surgical Tape, 2x10 (in/yd) Discharge Instruction: Secure dressing with tape as directed. Compression Wrap Compression Stockings Add-Ons Electronic Signature(s) Signed: 10/09/2020 6:01:10 PM By: Levan Hurst RN, BSN Signed: 10/13/2020 3:49:49 PM By: Sandre Kitty Entered By: Sandre Kitty on 10/09/2020 09:59:58 -------------------------------------------------------------------------------- [place] Details Patient Name: Date of Service: [name], [name] 10/08/2020 10:00 A M Medical Record Number: [id number] Patient Account Number: [id number] Date of Birth/Sex: Treating RN: 12/08/1970 (50 y.o. [name] Primary Care Roberth Berling: [name] Other Clinician: Referring Daci Stubbe: Treating Edge Mauger/Extender: Lenetta Quaker in Treatment: 4 Vital Signs Time Taken: 10:24 Temperature (F): 97.9 Height (in): 64 Pulse (bpm): 80 Weight (lbs): 188 Respiratory Rate (breaths/min): 16 Body Mass Index (BMI): 32.3 Blood Pressure (mmHg): 139/85 Capillary Blood Glucose (mg/dl): 220 Reference Range: 80 - 120 mg / dl Notes glucose per pt report Electronic Signature(s) Signed: 10/09/2020 6:01:10 PM By: Levan Hurst RN, BSN Entered By: Levan Hurst on 10/08/2020 10:25:04

## 2020-10-14 ENCOUNTER — Inpatient Hospital Stay (HOSPITAL_BASED_OUTPATIENT_CLINIC_OR_DEPARTMENT_OTHER): Payer: Medicaid Other | Admitting: Physician Assistant

## 2020-10-14 ENCOUNTER — Inpatient Hospital Stay: Payer: Medicaid Other | Attending: Physician Assistant

## 2020-10-14 ENCOUNTER — Other Ambulatory Visit: Payer: Self-pay

## 2020-10-14 VITALS — BP 155/86 | HR 90 | Temp 98.7°F | Resp 19 | Ht 69.0 in | Wt 189.8 lb

## 2020-10-14 DIAGNOSIS — Z79899 Other long term (current) drug therapy: Secondary | ICD-10-CM | POA: Insufficient documentation

## 2020-10-14 DIAGNOSIS — E785 Hyperlipidemia, unspecified: Secondary | ICD-10-CM | POA: Diagnosis not present

## 2020-10-14 DIAGNOSIS — N189 Chronic kidney disease, unspecified: Secondary | ICD-10-CM | POA: Insufficient documentation

## 2020-10-14 DIAGNOSIS — D72829 Elevated white blood cell count, unspecified: Secondary | ICD-10-CM | POA: Diagnosis not present

## 2020-10-14 DIAGNOSIS — D649 Anemia, unspecified: Secondary | ICD-10-CM

## 2020-10-14 DIAGNOSIS — I2510 Atherosclerotic heart disease of native coronary artery without angina pectoris: Secondary | ICD-10-CM | POA: Insufficient documentation

## 2020-10-14 DIAGNOSIS — Z7982 Long term (current) use of aspirin: Secondary | ICD-10-CM | POA: Diagnosis not present

## 2020-10-14 DIAGNOSIS — H409 Unspecified glaucoma: Secondary | ICD-10-CM | POA: Insufficient documentation

## 2020-10-14 DIAGNOSIS — I129 Hypertensive chronic kidney disease with stage 1 through stage 4 chronic kidney disease, or unspecified chronic kidney disease: Secondary | ICD-10-CM | POA: Insufficient documentation

## 2020-10-14 DIAGNOSIS — E1122 Type 2 diabetes mellitus with diabetic chronic kidney disease: Secondary | ICD-10-CM | POA: Diagnosis not present

## 2020-10-14 DIAGNOSIS — Z794 Long term (current) use of insulin: Secondary | ICD-10-CM | POA: Insufficient documentation

## 2020-10-14 LAB — CBC WITH DIFFERENTIAL (CANCER CENTER ONLY)
Abs Immature Granulocytes: 0.06 10*3/uL (ref 0.00–0.07)
Basophils Absolute: 0 10*3/uL (ref 0.0–0.1)
Basophils Relative: 0 %
Eosinophils Absolute: 0.3 10*3/uL (ref 0.0–0.5)
Eosinophils Relative: 3 %
HCT: 32.1 % — ABNORMAL LOW (ref 39.0–52.0)
Hemoglobin: 10.4 g/dL — ABNORMAL LOW (ref 13.0–17.0)
Immature Granulocytes: 1 %
Lymphocytes Relative: 30 %
Lymphs Abs: 3.6 10*3/uL (ref 0.7–4.0)
MCH: 26.9 pg (ref 26.0–34.0)
MCHC: 32.4 g/dL (ref 30.0–36.0)
MCV: 82.9 fL (ref 80.0–100.0)
Monocytes Absolute: 0.7 10*3/uL (ref 0.1–1.0)
Monocytes Relative: 5 %
Neutro Abs: 7.5 10*3/uL (ref 1.7–7.7)
Neutrophils Relative %: 61 %
Platelet Count: 213 10*3/uL (ref 150–400)
RBC: 3.87 MIL/uL — ABNORMAL LOW (ref 4.22–5.81)
RDW: 16.6 % — ABNORMAL HIGH (ref 11.5–15.5)
WBC Count: 12.2 10*3/uL — ABNORMAL HIGH (ref 4.0–10.5)
nRBC: 0 % (ref 0.0–0.2)

## 2020-10-14 LAB — FOLATE: Folate: 7.1 ng/mL (ref >5.9)

## 2020-10-14 LAB — SEDIMENTATION RATE: Sed Rate: 45 mm/hr — ABNORMAL HIGH (ref 0–16)
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
## 2020-10-15 ENCOUNTER — Encounter: Payer: Self-pay | Admitting: Registered"

## 2020-10-15 ENCOUNTER — Encounter: Payer: Medicaid Other | Attending: Physician Assistant | Admitting: Registered"

## 2020-10-15 ENCOUNTER — Encounter (HOSPITAL_BASED_OUTPATIENT_CLINIC_OR_DEPARTMENT_OTHER): Payer: Medicaid Other | Admitting: Internal Medicine

## 2020-10-15 ENCOUNTER — Other Ambulatory Visit: Payer: Self-pay

## 2020-10-15 DIAGNOSIS — E11621 Type 2 diabetes mellitus with foot ulcer: Secondary | ICD-10-CM | POA: Diagnosis not present

## 2020-10-15 DIAGNOSIS — E119 Type 2 diabetes mellitus without complications: Secondary | ICD-10-CM | POA: Diagnosis present

## 2020-10-15 NOTE — Progress Notes (Signed)
  Diabetes Self-Management Education  Visit Type:  Follow-up  Appt. Start Time: 8:03 Appt. End Time: 8:40  10/15/2020  [name], identified by name and date of birth, is a 50 y.o. male with a diagnosis of Diabetes:  .   ASSESSMENT  States his dad is currently passing away. States he has been stressing and smoking calms him down. States he did not attend smoking cessation class in June because its a lot going on. Reports he has increased insulin since previous visit. 15 units in the morning and 15 units at night. Reports recent BP155/80, BS 185, and A1c 10.8.   There were no vitals taken for this visit. There is no height or weight on file to calculate BMI.    Diabetes Self-Management Education - 00/37/04 8889       Complications   Last HgB A1C per patient/outside source 10.8 %    How often do you check your blood sugar? 1-2 times/day    Fasting Blood glucose range (mg/dL) 180-200      Individualized Goals (developed by patient)   Health Coping ask for help with (comment)   smoking cessation, managing stress     Post-Education Assessment   Patient understands how to develop strategies to promote health/change behavior. Demonstrates understanding / competency      Outcomes   Program Status Not Completed      Subsequent Visit   Since your last visit have you continued or begun to take your medications as prescribed? Yes    Since your last visit have you had your blood pressure checked? Yes    Is your most recent blood pressure lower, unchanged, or higher since your last visit? Unchanged    Since your last visit have you experienced any weight changes? No change    Since your last visit, are you checking your blood glucose at least once a day? Yes             Learning Objective:  Patient will have a greater understanding of diabetes self-management. Patient education plan is to attend individual and/or group sessions per assessed needs and concerns.   Plan:    Patient Instructions  - Contact Smoking Cessation program to begin August 1.    Expected Outcomes:  Demonstrated interest in learning. Expect positive outcomes  Education material provided: Smoking Cessation Program information  If problems or questions, patient to contact team via:  Phone and Email  Future DSME appointment: - 4-6 wks

## 2020-10-15 NOTE — Patient Instructions (Signed)
-   Contact Smoking Cessation program to begin August 1.

## 2020-10-17 ENCOUNTER — Ambulatory Visit: Payer: Medicaid Other | Admitting: Internal Medicine

## 2020-10-17 ENCOUNTER — Other Ambulatory Visit: Payer: Self-pay

## 2020-10-17 ENCOUNTER — Encounter: Payer: Self-pay | Admitting: Internal Medicine

## 2020-10-17 VITALS — BP 139/78 | HR 85 | Temp 98.7°F | Wt 184.0 lb

## 2020-10-17 DIAGNOSIS — M869 Osteomyelitis, unspecified: Secondary | ICD-10-CM

## 2020-10-17 DIAGNOSIS — E11628 Type 2 diabetes mellitus with other skin complications: Secondary | ICD-10-CM

## 2020-10-17 DIAGNOSIS — L089 Local infection of the skin and subcutaneous tissue, unspecified: Secondary | ICD-10-CM | POA: Diagnosis not present

## 2020-10-17 NOTE — Patient Instructions (Signed)
At this time it is difficult to say if you have ongoing infection  I am glad the anterior part had closed and covered the bone   Know that you are still at high risk for recurrent infection   At this time there is no role of further suppressive antibiotics   Keep seeing dr Dellia Nims for wound care. If there is concerning sign of recurrent infection, will need repeat imaging and deep cultures to guide further antibiotics
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
## 2020-10-18 LAB — COMPREHENSIVE METABOLIC PANEL
AG Ratio: 1 (calc) (ref 1.0–2.5)
ALT: 11 U/L (ref 9–46)
AST: 10 U/L (ref 10–35)
Albumin: 3.5 g/dL — ABNORMAL LOW (ref 3.6–5.1)
Alkaline phosphatase (APISO): 90 U/L (ref 35–144)
BUN/Creatinine Ratio: 11 (calc) (ref 6–22)
BUN: 33 mg/dL — ABNORMAL HIGH (ref 7–25)
CO2: 23 mmol/L (ref 20–32)
Calcium: 8.9 mg/dL (ref 8.6–10.3)
Chloride: 107 mmol/L (ref 98–110)
Creat: 3.14 mg/dL — ABNORMAL HIGH (ref 0.70–1.30)
Globulin: 3.4 g/dL (calc) (ref 1.9–3.7)
Glucose, Bld: 90 mg/dL (ref 65–99)
Potassium: 4.8 mmol/L (ref 3.5–5.3)
Sodium: 137 mmol/L (ref 135–146)
Total Bilirubin: 0.4 mg/dL (ref 0.2–1.2)
Total Protein: 6.9 g/dL (ref 6.1–8.1)

## 2020-10-18 LAB — CBC WITH DIFFERENTIAL/PLATELET
Absolute Monocytes: 771 cells/uL (ref 200–950)
Basophils Absolute: 35 cells/uL (ref 0–200)
Basophils Relative: 0.3 %
Eosinophils Absolute: 265 cells/uL (ref 15–500)
Eosinophils Relative: 2.3 %
HCT: 30.8 % — ABNORMAL LOW (ref 38.5–50.0)
Hemoglobin: 9.7 g/dL — ABNORMAL LOW (ref 13.2–17.1)
Lymphs Abs: 2898 cells/uL (ref 850–3900)
MCH: 26.4 pg — ABNORMAL LOW (ref 27.0–33.0)
MCHC: 31.5 g/dL — ABNORMAL LOW (ref 32.0–36.0)
MCV: 83.9 fL (ref 80.0–100.0)
MPV: 10.8 fL (ref 7.5–12.5)
Monocytes Relative: 6.7 %
Neutro Abs: 7533 cells/uL (ref 1500–7800)
Neutrophils Relative %: 65.5 %
Platelets: 262 10*3/uL (ref 140–400)
RBC: 3.67 10*6/uL — ABNORMAL LOW (ref 4.20–5.80)
RDW: 15.1 % — ABNORMAL HIGH (ref 11.0–15.0)
Total Lymphocyte: 25.2 %
WBC: 11.5 10*3/uL — ABNORMAL HIGH (ref 3.8–10.8)

## 2020-10-18 LAB — C-REACTIVE PROTEIN: CRP: 67.4 mg/L — ABNORMAL HIGH (ref <8.0)

## 2020-10-20 NOTE — Progress Notes (Signed)
SHANON, BECVAR ([id number]) Visit Report for 10/15/2020 Arrival Information Details Patient Name: Date of Service: [address] [address], [name] 10/15/2020 10:45 A M Medical Record Number: [id number] Patient Account Number: [id number] Date of Birth/Sex: Treating RN: 1971/03/16 (50 y.o. [name] Primary Care Devine Klingel: [name] Other Clinician: Referring Kalvyn Desa: Treating Ziyad Dyar/Extender: Lenetta Quaker in Treatment: 5 Visit Information History Since Last Visit Added or deleted any medications: No Patient Arrived: [name] Any new allergies or adverse reactions: No Arrival Time: 10:34 Had a fall or experienced change in No Accompanied By: self activities of daily living that may affect Transfer Assistance: None risk of falls: Patient Identification Verified: Yes Signs or symptoms of abuse/neglect since last visito No Secondary Verification Process Completed: Yes Hospitalized since last visit: No Patient Requires Transmission-Based Precautions: No Implantable device outside of the clinic excluding No Patient Has Alerts: No cellular tissue based products placed in the center since last visit: Has Dressing in Place as Prescribed: Yes Pain Present Now: No Electronic Signature(s) Signed: 10/17/2020 10:49:45 AM By: Sandre Kitty Entered By: Sandre Kitty on 10/15/2020 10:34:27 -------------------------------------------------------------------------------- Encounter Discharge Information Details Patient Name: Date of Service: [name], [name] 10/15/2020 10:45 A M Medical Record Number: [id number] Patient Account Number: [id number] Date of Birth/Sex: Treating RN: 09-11-1970 (50 y.o. [name], [name] Primary Care Halimah Bewick: [name] Other Clinician: Referring Kathee Tumlin: Treating Jennier Schissler/Extender: Lenetta Quaker in Treatment: 5 Encounter Discharge Information Items Post Procedure Vitals Discharge Condition:  Stable Temperature (F): 97.4 Ambulatory Status: Ambulatory Pulse (bpm): 74 Discharge Destination: Home Respiratory Rate (breaths/min): 17 Transportation: Private Auto Blood Pressure (mmHg): 143/77 Accompanied By: self Schedule Follow-up Appointment: Yes Clinical Summary of Care: Patient Declined Electronic Signature(s) Signed: 10/16/2020 7:48:44 PM By: Rhae Hammock RN Entered By: Rhae Hammock on 10/15/2020 12:06:02 -------------------------------------------------------------------------------- Lower Extremity Assessment Details Patient Name: Date of Service: [name], [name] 10/15/2020 10:45 A M Medical Record Number: [id number] Patient Account Number: [id number] Date of Birth/Sex: Treating RN: 01/04/1971 (50 y.o. [name] Primary Care Marsi Turvey: [name] Other Clinician: Referring Gilma Bessette: Treating Bassem Bernasconi/Extender: Tomasa Blase, [name] in Treatment: 5 Edema Assessment Assessed: [Left: No] [Right: No] Edema: [Left: N] [Right: o] Calf Left: Right: Point of Measurement: From Medial Instep 34.5 cm Ankle Left: Right: Point of Measurement: From Medial Instep 23.5 cm Vascular Assessment Pulses: Dorsalis Pedis Palpable: [Right:Yes] Electronic Signature(s) Signed: 10/20/2020 4:44:07 PM By: Levan Hurst RN, BSN Entered By: Levan Hurst on 10/15/2020 10:49:17 -------------------------------------------------------------------------------- Multi Wound Chart Details Patient Name: Date of Service: [name], [name] 10/15/2020 10:45 A M Medical Record Number: [id number] Patient Account Number: [id number] Date of Birth/Sex: Treating RN: 10-30-1970 (50 y.o. [name] Primary Care Cheskel Silverio: [name] Other Clinician: Referring Viney Acocella: Treating Juno Alers/Extender: Tomasa Blase, [name] in Treatment: 5 Vital Signs Height(in): 64 Capillary Blood Glucose(mg/dl): 180 Weight(lbs): 188 Pulse(bpm):  85 Body Mass Index(BMI): 32 Blood Pressure(mmHg): 148/82 Temperature(F): 98.7 Respiratory Rate(breaths/min): 16 Photos: [1:No Photos Right Amputation Site -] [N/A:N/A N/A] Wound Location: [1:Transmetatarsal Surgical Injury] [N/A:N/A] Wounding Event: [1:Diabetic Wound/Ulcer of the Lower] [N/A:N/A] Primary Etiology: [1:Extremity Open Surgical Wound] [N/A:N/A] Secondary Etiology: [1:Anemia, Coronary Artery Disease,] [N/A:N/A] Comorbid History: [1:Hypertension, Type II Diabetes, Osteomyelitis, Neuropathy 02/28/2020] [N/A:N/A] Date Acquired: [1:5] [N/A:N/A] Weeks of Treatment: [1:Open] [N/A:N/A] Wound Status: [1:1x0.5x1.4] [N/A:N/A] Measurements L x W x D (cm) [1:0.393] [N/A:N/A] A (cm) : rea [1:0.55] [N/A:N/A] Volume (cm) : [1:67.50%] [N/A:N/A] % Reduction in A rea: [1:43.20%] [N/A:N/A] % Reduction in Volume: [  1:12] Position 1 (o'clock): [1:1.2] Maximum Distance 1 (cm): [1:Yes] [N/A:N/A] Tunneling: [1:Grade 3] [N/A:N/A] Classification: [1:Medium] [N/A:N/A] Exudate A mount: [1:Serosanguineous] [N/A:N/A] Exudate Type: [1:red, brown] [N/A:N/A] Exudate Color: [1:Distinct, outline attached] [N/A:N/A] Wound Margin: [1:Large (67-100%)] [N/A:N/A] Granulation A mount: [1:Pink, Pale] [N/A:N/A] Granulation Quality: [1:None Present (0%)] [N/A:N/A] Necrotic A mount: [1:Fat Layer (Subcutaneous Tissue): Yes N/A] Exposed Structures: [1:Fascia: No Tendon: No Muscle: No Joint: No Bone: No Medium (34-66%)] [N/A:N/A] Epithelialization: [1:Debridement - Excisional] [N/A:N/A] Debridement: Pre-procedure Verification/Time Out 11:00 [N/A:N/A] Taken: [1:Callus, Subcutaneous] [N/A:N/A] Tissue Debrided: [1:Skin/Subcutaneous Tissue] [N/A:N/A] Level: [1:6] [N/A:N/A] Debridement A (sq cm): [1:rea Blade, Curette, Forceps] [N/A:N/A] Instrument: [1:Minimum] [N/A:N/A] Bleeding: [1:Pressure] [N/A:N/A] Hemostasis A chieved: [1:0] [N/A:N/A] Procedural Pain: [1:0] [N/A:N/A] Post Procedural Pain: [1:Procedure  was tolerated well] [N/A:N/A] Debridement Treatment Response: [1:1x0.5x1.4] [N/A:N/A] Post Debridement Measurements L x W x D (cm) [1:0.55] [N/A:N/A] Post Debridement Volume: (cm) [1:Debridement] [N/A:N/A] Treatment Notes Electronic Signature(s) Signed: 10/15/2020 4:58:30 PM By: [name] MD Signed: 10/20/2020 4:44:07 PM By: Levan Hurst RN, BSN Entered By: [name] on 10/15/2020 11:08:40 -------------------------------------------------------------------------------- Multi-Disciplinary Care Plan Details Patient Name: Date of Service: [name], [name] 10/15/2020 10:45 A M Medical Record Number: [id number] Patient Account Number: [id number] Date of Birth/Sex: Treating RN: [birth year] (50 y.o. [name] Primary Care Daymian Lill: [name] Other Clinician: Referring Micaiah Remillard: Treating Hani Patnode/Extender: Tomasa Blase, [name] in Treatment: 5 Active Inactive Wound/Skin Impairment Nursing Diagnoses: Impaired tissue integrity Goals: Patient/caregiver will verbalize understanding of skin care regimen Date Initiated: 09/05/2020 Target Resolution Date: 11/07/2020 Goal Status: Active Ulcer/skin breakdown will have a volume reduction of 30% by week 4 Date Initiated: 09/05/2020 Date Inactivated: 10/08/2020 Target Resolution Date: 10/03/2020 Goal Status: Met Interventions: Assess patient/caregiver ability to obtain necessary supplies Assess patient/caregiver ability to perform ulcer/skin care regimen upon admission and as needed Assess ulceration(s) every visit Provide education on ulcer and skin care Treatment Activities: Patient referred to home care : 09/05/2020 Skin care regimen initiated : 09/05/2020 Topical wound management initiated : 09/05/2020 Notes: Electronic Signature(s) Signed: 10/20/2020 4:44:07 PM By: Levan Hurst RN, BSN Entered By: Levan Hurst on 10/15/2020  11:00:17 -------------------------------------------------------------------------------- Pain Assessment Details Patient Name: Date of Service: [name], [name] 10/15/2020 10:45 A M Medical Record Number: [id number] Patient Account Number: [id number] Date of Birth/Sex: Treating RN: November 02, 1970 (50 y.o. [name] Primary Care Latif Nazareno: [name] Other Clinician: Referring Camar Guyton: Treating Ladarrius Bogdanski/Extender: Tomasa Blase, [name] in Treatment: 5 Active Problems Location of Pain Severity and Description of Pain Patient Has Paino No Site Locations Pain Management and Medication Current Pain Management: Electronic Signature(s) Signed: 10/17/2020 10:49:45 AM By: Sandre Kitty Signed: 10/20/2020 4:44:07 PM By: Levan Hurst RN, BSN Entered By: Sandre Kitty on 10/15/2020 10:35:01 -------------------------------------------------------------------------------- Patient/Caregiver Education Details Patient Name: Date of Service: [name], [place] 7/20/2022andnbsp10:45 A M Medical Record Number: [id number] Patient Account Number: [id number] Date of Birth/Gender: Treating RN: [birth year] (50 y.o. [name] Primary Care Physician: [name] Other Clinician: Referring Physician: Treating Physician/Extender: Lenetta Quaker in Treatment: 5 Education Assessment Education Provided To: Patient Education Topics Provided Wound/Skin Impairment: Methods: Explain/Verbal Responses: State content correctly Electronic Signature(s) Signed: 10/20/2020 4:44:07 PM By: Levan Hurst RN, BSN Entered By: Levan Hurst on 10/15/2020 18:03:10 -------------------------------------------------------------------------------- Wound Assessment Details Patient Name: Date of Service: [name], [name] 10/15/2020 10:45 A M Medical Record Number: [id number] Patient Account Number: [id number] Date of Birth/Sex: Treating  RN: 11-16-1970 (50 y.o. 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 Secondary Open Surgical Wound Wounding Event: Surgical Injury Etiology: Date Acquired: 02/28/2020 Wound Open Weeks Of Treatment: 5 Status: Clustered Wound: No Comorbid Anemia, Coronary Artery Disease, Hypertension, Type II History: Diabetes, Osteomyelitis, Neuropathy Photos Photo Uploaded By: Scammell, Michael on 10/16/2020 13:39:24 Wound Measurements Length: (cm) 1 Width: (cm) 0.5 Depth: (cm) 1.4 Area: (cm) 0.393 Volume: (cm) 0.55 % Reduction in Area: 67.5% % Reduction in Volume: 43.2% Epithelialization: Medium (34-66%) Tunneling: Yes Position (o'clock): 12 Maximum Distance: (cm) 1.2 Undermining: No Wound Description Classification: Grade 3 Wound Margin: Distinct, outline attached Exudate Amount: Medium Exudate Type: Serosanguineous Exudate Color: red, brown Foul Odor After Cleansing: No Slough/Fibrino No Wound Bed Granulation Amount: Large (67-100%) Exposed Structure Granulation Quality: Pink, Pale Fascia Exposed: No Necrotic Amount: None Present (0%) Fat Layer (Subcutaneous Tissue) Exposed: Yes Tendon Exposed: No Muscle Exposed: No Joint Exposed: No Bone Exposed: No Treatment Notes Wound #1 (Amputation Site - Transmetatarsal) Wound Laterality: Right Cleanser Soap and Water Discharge Instruction: May shower and wash wound with dial antibacterial soap and water prior to dressing change. Wound Cleanser Discharge Instruction: Cleanse the wound with wound cleanser prior to applying a clean dressing using gauze sponges, not tissue or cotton balls. Peri-Wound Care Topical Primary Dressing KerraCel Ag Gelling Fiber  Dressing, 4x5 in (silver alginate) Discharge Instruction: Pack into undermined area Secondary Dressing Woven Gauze Sponge, Non-Sterile 4x4 in Discharge Instruction: Apply over primary dressing as directed. ABD Pad, 5x9 Discharge Instruction: Apply over primary dressing as directed. Secured With Kerlix Roll Sterile, 4.5x3.1 (in/yd) Discharge Instruction: Secure with Kerlix as directed. Transpore Surgical Tape, 2x10 (in/yd) Discharge Instruction: Secure dressing with tape as directed. Compression Wrap Compression Stockings Add-Ons Electronic Signature(s) Signed: 10/20/2020 4:44:07 PM By: Lynch, Shatara RN, BSN Entered By: Lynch, Shatara on 10/15/2020 10:49:34 -------------------------------------------------------------------------------- Vitals Details Patient Name: Date of Service: [name], [name] 10/15/2020 10:45 A M Medical Record Number: [id number] Patient Account Number: [id number] Date of Birth/Sex: Treating RN: 11/21/1970 (50 y.o. M) Lynch, Shatara Primary Care Provider: Fleming, Zelda Other Clinician: Referring Provider: Treating Provider/Extender: Robson, Michael Fleming, Zelda Weeks in Treatment: 5 Vital Signs Time Taken: 10:34 Temperature (°F): 98.7 Height (in): 64 Pulse (bpm): 85 Weight (lbs): 188 Respiratory Rate (breaths/min): 16 Body Mass Index (BMI): 32.3 Blood Pressure (mmHg): 148/82 Capillary Blood Glucose (mg/dl): 180 Reference Range: 80 - 120 mg / dl Electronic Signature(s) Signed: 10/17/2020 10:49:45 AM By: Dawkins, Destiny Entered By: Dawkins, Destiny on 10/15/2020 10:34:53 

## 2020-10-20 NOTE — Progress Notes (Signed)
[name], [name] ([id number]) Visit Report for 10/15/2020 Debridement Details Patient Name: Date of Service: [address], [name] 10/15/2020 10:45 A M Medical Record Number: [id number] Patient Account Number: [id number] Date of Birth/Sex: Treating RN: Jul 21, 1970 (50 y.o. [name] Primary Care Provider: [name] Other Clinician: Referring Provider: Treating Provider/Extender: Lenetta Quaker in Treatment: 5 Debridement Performed for Assessment: Wound #1 Right Amputation Site - Transmetatarsal Performed By: Physician Ricard Dillon., MD Debridement Type: Debridement Severity of Tissue Pre Debridement: Fat layer exposed Level of Consciousness (Pre-procedure): Awake and Alert Pre-procedure Verification/Time Out Yes - 11:00 Taken: Start Time: 11:00 T Area Debrided (L x W): otal 2 (cm) x 3 (cm) = 6 (cm) Tissue and other material debrided: Viable, Non-Viable, Callus, Subcutaneous Level: Skin/Subcutaneous Tissue Debridement Description: Excisional Instrument: Blade, Curette, Forceps Bleeding: Minimum Hemostasis Achieved: Pressure End Time: 11:02 Procedural Pain: 0 Post Procedural Pain: 0 Response to Treatment: Procedure was tolerated well Level of Consciousness (Post- Awake and Alert procedure): Post Debridement Measurements of Total Wound Length: (cm) 1 Width: (cm) 0.5 Depth: (cm) 1.4 Volume: (cm) 0.55 Character of Wound/Ulcer Post Debridement: Stable Severity of Tissue Post Debridement: Fat layer exposed Post Procedure Diagnosis Same as Pre-procedure Electronic Signature(s) Signed: 10/15/2020 4:58:30 PM By: [name] MD Signed: 10/20/2020 4:44:07 PM By: Levan Hurst RN, BSN Entered By: [name] on 10/15/2020 11:10:06 -------------------------------------------------------------------------------- HPI Details Patient Name: Date of Service: [name] RD, [name] 10/15/2020 10:45 A M Medical Record Number: [id number] Patient  Account Number: [id number] Date of Birth/Sex: Treating RN: Mar 20, 1971 (50 y.o. [name] Primary Care Provider: [name] Other Clinician: Referring Provider: Treating Provider/Extender: Lenetta Quaker in Treatment: 5 History of Present Illness HPI Description: ADMISSION 09/05/2020 This is a 50 year old man who has type 2 diabetes. Essentially the problem began admitted to [facility] health in December with infection gas gangrene. Cultures ultimately grew staph lugdunensis. 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 March Rummage. Shortly thereafter he developed a nonhealing wound with wound dehiscence noted on 05/09/2020. He had a wound VAC I think for a period of time after the surgery but it did not help. He has undergone an IandD and skin graft on the right foot on 05/16/2020. He underwent a further IandD on 06/04/2020 not debrided to bone. 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 Culture of the metatarsal grew Enterobacter cloacae I. He has been followed by Dr. [name] of infectious disease. Currently on Levaquin and doxycycline I think at about the 4-week of 6 mark. At the last visit with Dr. March Rummage he was supposed to have use Santyl although I do not think he has it and he has been using I think a Betadine wet-to-dry. He is referred here by Dr. [name] for our review who saw him yesterday. I cannot see that he has had imaging studies of the right foot. No MRI Past medical history includes type 2 diabetes, in 2021 and ulcer of the left foot that ultimately healed with a wound VAC, stage III chronic kidney disease, first toe amputations bilaterally, hypertension, still a 1/2 pack/day smoker. The patient had ABIs on 08/09/2018 which was 1.00 on the right and 0.96 on the left both areas had triphasic waveforms. Not felt to have an arterial issue. 09/12/2020; 1 week  follow-up. He has been doing the dressings along with home health. This is a deep punched out area  in the incision of his transmit site. We use silver alginate last week to help with drainage and antibacterial properties. He tolerated this well 6/24; the patient was actually at his podiatrist this morning who debrided the wound. We have been using silver alginate. They are going to follow him in 6 weeks. 6/29; patient is using silver alginate. Apparently saw Dr. March Rummage this week although I have not checked his note I will try to do so. He is following up in 6 weeks. This was the original transmetatarsal amputation when I first saw this 3 mid part of his amputation site was deep down to bone however this seems to have progressively close down which is gratifying to see. He still has a fairly callus uneven surface however spreading this apart its difficult to identify anything that does not look epithelialized. When I first saw this I thought he would require an extensive debridement perhaps back in the OR by Dr. March Rummage however all of this appears to be looking better and at this point I would continue with the silver alginate see if this holds together over the next several weeks. He does not have an arterial issue 7/13; 2-week follow-up. Unfortunately the area did not hold together there was drainage on his dressing. Still copious amounts of callus. It was difficult to see where this actually was open therefore I elected to go ahead with a vigorous debridement. Still using silver alginate 7/20 I brought him back after extensive callus debridement last week. The only open area was on the medial part of the foot extensive debridement of thick surrounding callus and I was able to do identify the remaining tunneling area. Still using silver alginate Electronic Signature(s) Signed: 10/15/2020 4:58:30 PM By: [name] MD Entered By: [name] on 10/15/2020  11:11:42 -------------------------------------------------------------------------------- Physical Exam Details Patient Name: Date of Service: [name] RD, [name] 10/15/2020 10:45 A M Medical Record Number: [id number] Patient Account Number: [id number] Date of Birth/Sex: Treating RN: 29-Oct-1970 (50 y.o. [name] Primary Care Provider: [name] Other Clinician: Referring Provider: Treating Provider/Extender: Lenetta Quaker in Treatment: 5 Constitutional Patient is hypertensive.. Pulse regular and within target range for patient.Marland Kitchen Respirations regular, non-labored and within target range.. Temperature is normal and within the target range for the patient.Marland Kitchen Appears in no distress. Notes Wound exam; transmetatarsal amputation extensive debridement of callus from last time. Most of the top of this wound had closed however medially there was an extensive area of callus that I removed with pickups and #10 scalpel to reveal a full-thickness wound with some tunneling. I then used a #3 curette to remove debris from this area. Granulation looks to clean up quite nicely Electronic Signature(s) Signed: 10/15/2020 4:58:30 PM By: [name] MD Entered By: [name] on 10/15/2020 11:12:45 -------------------------------------------------------------------------------- Physician Orders Details Patient Name: Date of Service: [name] RD, [place]. 10/15/2020 10:45 A M Medical Record Number: [id number] Patient Account Number: [id number] Date of Birth/Sex: Treating RN: [birth year] (50 y.o. [name] Primary Care Provider: [name] Other Clinician: Referring Provider: Treating Provider/Extender: Lenetta Quaker in Treatment: 5 Verbal / Phone Orders: No Diagnosis Coding ICD-10 Coding Code Description E11.621 Type 2 diabetes mellitus with foot ulcer L97.514 Non-pressure chronic ulcer of other part of right foot with necrosis  of bone T81.31XS Disruption of external operation (surgical) wound, not elsewhere classified, sequela M86.671 Other chronic osteomyelitis, right ankle and foot Follow-up Appointments ppointment in 1 week. - Dr. Dellia Nims Return A  Bathing/ Shower/ Hygiene May shower and wash wound with soap and water. - on days you change dressing Edema Control - Lymphedema / SCD / Other Avoid standing for long periods of time. Exercise regularly Off-Loading Open toe surgical shoe to: - Wear at all times except driving. Additional Orders / Instructions Follow Nutritious Diet Non Wound Condition Other Non Wound Condition Orders/Instructions: - cushion bottom of left foot with orthopedic felt or foam Home Health No change in wound care orders this week; continue Home Health for wound care. May utilize formulary equivalent dressing for wound treatment orders unless otherwise specified. Other Home Health Orders/Instructions: - Advanced Wound Treatment Wound #1 - Amputation Site - Transmetatarsal Wound Laterality: Right Cleanser: Soap and Water [facility]) Every Other Day/30 Days Discharge Instructions: May shower and wash wound with dial antibacterial soap and water prior to dressing change. Cleanser: Wound Cleanser Every Other Day/30 Days Discharge Instructions: Cleanse the wound with wound cleanser prior to applying a clean dressing using gauze sponges, not tissue or cotton balls. Prim Dressing: KerraCel Ag Gelling Fiber Dressing, 4x5 in (silver alginate) (Home Health) Every Other Day/30 Days ary Discharge Instructions: Pack into undermined area Secondary Dressing: Woven Gauze Sponge, Non-Sterile 4x4 in [facility]) Every Other Day/30 Days Discharge Instructions: Apply over primary dressing as directed. Secondary Dressing: ABD Pad, 5x9 [facility]) Every Other Day/30 Days Discharge Instructions: Apply over primary dressing as directed. Secured With: [employer], 4.5x3.1 (in/yd) [facility])  Every Other Day/30 Days Discharge Instructions: Secure with Kerlix as directed. Secured With: Transpore Surgical Tape, 2x10 (in/yd) [facility]) Every Other Day/30 Days Discharge Instructions: Secure dressing with tape as directed. Electronic Signature(s) Signed: 10/15/2020 4:58:30 PM By: [name] MD Signed: 10/20/2020 4:44:07 PM By: Levan Hurst RN, BSN Entered By: Levan Hurst on 10/15/2020 11:04:32 -------------------------------------------------------------------------------- Problem List Details Patient Name: Date of Service: [name] RD, [name] 10/15/2020 10:45 A M Medical Record Number: [id number] Patient Account Number: [id number] Date of Birth/Sex: Treating RN: [birth year] (50 y.o. [name] Primary Care Provider: [name] Other Clinician: Referring Provider: Treating Provider/Extender: Lenetta Quaker in Treatment: 5 Active Problems ICD-10 Encounter Code Description Active Date MDM Diagnosis E11.621 Type 2 diabetes mellitus with foot ulcer 09/05/2020 No Yes L97.514 Non-pressure chronic ulcer of other part of right foot with necrosis of bone 09/05/2020 No Yes T81.31XS Disruption of external operation (surgical) wound, not elsewhere classified, 09/05/2020 No Yes sequela M86.671 Other chronic osteomyelitis, right ankle and foot 09/05/2020 No Yes Inactive Problems Resolved Problems Electronic Signature(s) Signed: 10/15/2020 4:58:30 PM By: [name] MD Entered By: [name] on 10/15/2020 11:08:32 -------------------------------------------------------------------------------- Progress Note Details Patient Name: Date of Service: [name] RD, [name] 10/15/2020 10:45 A M Medical Record Number: [id number] Patient Account Number: [id number] Date of Birth/Sex: Treating RN: [birth year] (50 y.o. [name] Primary Care Provider: [name] Other Clinician: Referring Provider: Treating Provider/Extender: Tomasa Blase, [name] in Treatment: 5 Subjective History of Present Illness (HPI) ADMISSION 09/05/2020 This is a 50 year old man who has type 2 diabetes. Essentially the problem began admitted to [facility] health in December with infection gas gangrene. Cultures ultimately grew staph lugdunensis. 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 March Rummage. Shortly thereafter he developed a nonhealing wound with wound dehiscence noted on 05/09/2020. He had a wound VAC I think for a period of time after the surgery but it did not help. He has undergone an IandD and skin graft on the right  foot on 05/16/2020. He underwent a further IandD on 06/04/2020 not debrided to bone. 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 Culture of the metatarsal grew Enterobacter cloacae I. He has been followed by Dr. [name] of infectious disease. Currently on Levaquin and doxycycline I think at about the 4-week of 6 mark. At the last visit with Dr. March Rummage he was supposed to have use Santyl although I do not think he has it and he has been using I think a Betadine wet-to-dry. He is referred here by Dr. [name] for our review who saw him yesterday. I cannot see that he has had imaging studies of the right foot. No MRI Past medical history includes type 2 diabetes, in 2021 and ulcer of the left foot that ultimately healed with a wound VAC, stage III chronic kidney disease, first toe amputations bilaterally, hypertension, still a 1/2 pack/day smoker. The patient had ABIs on 08/09/2018 which was 1.00 on the right and 0.96 on the left both areas had triphasic waveforms. Not felt to have an arterial issue. 09/12/2020; 1 week follow-up. He has been doing the dressings along with home health. This is a deep punched out area in the incision of his transmit site. We use silver alginate last week to help with drainage and antibacterial  properties. He tolerated this well 6/24; the patient was actually at his podiatrist this morning who debrided the wound. We have been using silver alginate. They are going to follow him in 6 weeks. 6/29; patient is using silver alginate. Apparently saw Dr. March Rummage this week although I have not checked his note I will try to do so. He is following up in 6 weeks. This was the original transmetatarsal amputation when I first saw this 3 mid part of his amputation site was deep down to bone however this seems to have progressively close down which is gratifying to see. He still has a fairly callus uneven surface however spreading this apart its difficult to identify anything that does not look epithelialized. When I first saw this I thought he would require an extensive debridement perhaps back in the OR by Dr. March Rummage however all of this appears to be looking better and at this point I would continue with the silver alginate see if this holds together over the next several weeks. He does not have an arterial issue 7/13; 2-week follow-up. Unfortunately the area did not hold together there was drainage on his dressing. Still copious amounts of callus. It was difficult to see where this actually was open therefore I elected to go ahead with a vigorous debridement. Still using silver alginate 7/20 I brought him back after extensive callus debridement last week. The only open area was on the medial part of the foot extensive debridement of thick surrounding callus and I was able to do identify the remaining tunneling area. Still using silver alginate Objective Constitutional Patient is hypertensive.. Pulse regular and within target range for patient.Marland Kitchen Respirations regular, non-labored and within target range.. Temperature is normal and within the target range for the patient.Marland Kitchen Appears in no distress. Vitals Time Taken: 10:34 AM, Height: 64 in, Weight: 188 lbs, BMI: 32.3, Temperature: 98.7 F, Pulse: 85 bpm,  Respiratory Rate: 16 breaths/min, Blood Pressure: 148/82 mmHg, Capillary Blood Glucose: 180 mg/dl. General Notes: Wound exam; transmetatarsal amputation extensive debridement of callus from last time. Most of the top of this wound had closed however medially there was an extensive  area of callus that I removed with pickups and #10 scalpel to reveal a full-thickness wound with some tunneling. I then used a #3 curette to remove debris from this area. Granulation looks to clean up quite nicely Integumentary (Hair, Skin) Wound #1 status is Open. Original cause of wound was Surgical Injury. The date acquired was: 02/28/2020. The wound has been in treatment 5 weeks. The wound is located on the Right Amputation Site - Transmetatarsal. The wound measures 1cm length x 0.5cm width x 1.4cm depth; 0.393cm^2 area and 0.55cm^3 volume. There is Fat Layer (Subcutaneous Tissue) exposed. There is no undermining noted, however, there is tunneling at 12:00 with a maximum distance of 1.2cm. There is a medium amount of serosanguineous drainage noted. The wound margin is distinct with the outline attached to the wound base. There is large (67-100%) pink, pale granulation within the wound bed. There is no necrotic tissue within the wound bed. Assessment Active Problems ICD-10 Type 2 diabetes mellitus with foot ulcer Non-pressure chronic ulcer of other part of right foot with necrosis of bone Disruption of external operation (surgical) wound, not elsewhere classified, sequela Other chronic osteomyelitis, right ankle and foot Procedures Wound #1 Pre-procedure diagnosis of Wound #1 is a Diabetic Wound/Ulcer of the Lower Extremity located on the Right Amputation Site - Transmetatarsal .Severity of Tissue Pre Debridement is: Fat layer exposed. There was a Excisional Skin/Subcutaneous Tissue Debridement with a total area of 6 sq cm performed by Ricard Dillon., MD. With the following instrument(s): Blade, Curette, and  Forceps to remove Viable and Non-Viable tissue/material. Material removed includes Callus and Subcutaneous Tissue and. No specimens were taken. A time out was conducted at 11:00, prior to the start of the procedure. A Minimum amount of bleeding was controlled with Pressure. The procedure was tolerated well with a pain level of 0 throughout and a pain level of 0 following the procedure. Post Debridement Measurements: 1cm length x 0.5cm width x 1.4cm depth; 0.55cm^3 volume. Character of Wound/Ulcer Post Debridement is stable. Severity of Tissue Post Debridement is: Fat layer exposed. Post procedure Diagnosis Wound #1: Same as Pre-Procedure Plan Follow-up Appointments: Return Appointment in 1 week. - Dr. Dellia Nims Bathing/ Shower/ Hygiene: May shower and wash wound with soap and water. - on days you change dressing Edema Control - Lymphedema / SCD / Other: Avoid standing for long periods of time. Exercise regularly Off-Loading: Open toe surgical shoe to: - Wear at all times except driving. Additional Orders / Instructions: Follow Nutritious Diet Non Wound Condition: Other Non Wound Condition Orders/Instructions: - cushion bottom of left foot with orthopedic felt or foam Home Health: No change in wound care orders this week; continue Home Health for wound care. May utilize formulary equivalent dressing for wound treatment orders unless otherwise specified. Other Home Health Orders/Instructions: - Advanced WOUND #1: - Amputation Site - Transmetatarsal Wound Laterality: Right Cleanser: Soap and Water [facility]) Every Other Day/30 Days Discharge Instructions: May shower and wash wound with dial antibacterial soap and water prior to dressing change. Cleanser: Wound Cleanser Every Other Day/30 Days Discharge Instructions: Cleanse the wound with wound cleanser prior to applying a clean dressing using gauze sponges, not tissue or cotton balls. Prim Dressing: KerraCel Ag Gelling Fiber Dressing, 4x5  in (silver alginate) (Home Health) Every Other Day/30 Days ary Discharge Instructions: Pack into undermined area Secondary Dressing: Woven Gauze Sponge, Non-Sterile 4x4 in [facility]) Every Other Day/30 Days Discharge Instructions: Apply over primary dressing as directed. Secondary Dressing: ABD Pad, 5x9 (Home  Health) Every Other Day/30 Days Discharge Instructions: Apply over primary dressing as directed. Secured With: [employer], 4.5x3.1 (in/yd) [facility]) Every Other Day/30 Days Discharge Instructions: Secure with Kerlix as directed. Secured With: Transpore Surgical T ape, 2x10 (in/yd) (Home Health) Every Other Day/30 Days Discharge Instructions: Secure dressing with tape as directed. 1. I am still using silver alginate in this case to the medial wound on the transmetatarsal amputation site. May need silver collagen next week depending on how it responds. 2. No evidence of infection Electronic Signature(s) Signed: 10/15/2020 4:58:30 PM By: [name] MD Entered By: [name] on 10/15/2020 11:13:23 -------------------------------------------------------------------------------- SuperBill Details Patient Name: Date of Service: [name] RD, [name] 10/15/2020 Medical Record Number: [id number] Patient Account Number: [id number] Date of Birth/Sex: Treating RN: 1970-09-24 (50 y.o. [name] Primary Care Provider: [name] Other Clinician: Referring Provider: Treating Provider/Extender: Tomasa Blase, [name] in Treatment: 5 Diagnosis Coding ICD-10 Codes Code Description E11.621 Type 2 diabetes mellitus with foot ulcer L97.514 Non-pressure chronic ulcer of other part of right foot with necrosis of bone T81.31XS Disruption of external operation (surgical) wound, not elsewhere classified, sequela M86.671 Other chronic osteomyelitis, right ankle and foot Facility Procedures Physician Procedures : CPT4 Code Description Modifier [id number]  [id number] - WC PHYS SUBQ TISS 20 SQ CM ICD-10 Diagnosis Description L97.514 Non-pressure chronic ulcer of other part of right foot with necrosis of bone Quantity: 1 Electronic Signature(s) Signed: 10/15/2020 4:58:30 PM By: [name] MD Entered By: [name] on 10/15/2020 11:13:36

## 2020-10-22 ENCOUNTER — Encounter (HOSPITAL_BASED_OUTPATIENT_CLINIC_OR_DEPARTMENT_OTHER): Payer: Medicaid Other | Admitting: Internal Medicine

## 2020-10-22 ENCOUNTER — Other Ambulatory Visit: Payer: Self-pay

## 2020-10-22 DIAGNOSIS — E11621 Type 2 diabetes mellitus with foot ulcer: Secondary | ICD-10-CM | POA: Diagnosis not present

## 2020-10-22 NOTE — Progress Notes (Signed)
[name], [name] ([id number]) Visit Report for 10/22/2020 Debridement Details Patient Name: Date of Service: [name], [name] 10/22/2020 11:00 A M Medical Record Number: [id number] Patient Account Number: [id number] Date of Birth/Sex: Treating RN: 02/21/1971 (50 y.o. [name] Primary Care Provider: Geryl [name] Other Clinician: Referring Provider: Treating Provider/Extender: Lenetta Quaker in Treatment: 6 Debridement Performed for Assessment: Wound #1 Right Amputation Site - Transmetatarsal Performed By: Physician Ricard Dillon., MD Debridement Type: Debridement Severity of Tissue Pre Debridement: Fat layer exposed Level of Consciousness (Pre-procedure): Awake and Alert Pre-procedure Verification/Time Out Yes - 11:35 Taken: Start Time: 11:36 T Area Debrided (L x W): otal 1 (cm) x 0.5 (cm) = 0.5 (cm) Tissue and other material debrided: Non-Viable, Callus, Subcutaneous Level: Skin/Subcutaneous Tissue Debridement Description: Excisional Instrument: Curette Bleeding: Minimum Hemostasis Achieved: Pressure End Time: 11:38 Response to Treatment: Procedure was tolerated well Level of Consciousness (Post- Awake and Alert procedure): Post Debridement Measurements of Total Wound Length: (cm) 1 Width: (cm) 0.5 Depth: (cm) 1.2 Volume: (cm) 0.471 Character of Wound/Ulcer Post Debridement: Stable Severity of Tissue Post Debridement: Fat layer exposed Post Procedure Diagnosis Same as Pre-procedure Electronic Signature(s) Signed: 10/22/2020 4:50:28 PM By: [name] MD Signed: 10/22/2020 5:49:27 PM By: Levan Hurst RN, BSN Entered By: [name] on 10/22/2020 11:55:43 -------------------------------------------------------------------------------- Debridement Details Patient Name: Date of Service: [name] RD, [name] 10/22/2020 11:00 A M Medical Record Number: [id number] Patient Account Number: [id number] Date of Birth/Sex: Treating  RN: 03-06-1971 (50 y.o. [name] Primary Care Provider: Geryl [name] Other Clinician: Referring Provider: Treating Provider/Extender: Lenetta Quaker in Treatment: 6 Debridement Performed for Assessment: Wound #2 Hood Performed By: Physician Ricard Dillon., MD Debridement Type: Debridement Severity of Tissue Pre Debridement: Fat layer exposed Level of Consciousness (Pre-procedure): Awake and Alert Pre-procedure Verification/Time Out Yes - 11:35 Taken: Start Time: 11:38 T Area Debrided (L x W): otal 4 (cm) x 4 (cm) = 16 (cm) Tissue and other material debrided: Non-Viable, Callus, Subcutaneous Level: Skin/Subcutaneous Tissue Debridement Description: Excisional Instrument: Blade, Forceps Bleeding: Minimum Hemostasis Achieved: Pressure End Time: 11:38 Response to Treatment: Procedure was tolerated well Level of Consciousness (Post- Awake and Alert procedure): Post Debridement Measurements of Total Wound Length: (cm) 4 Width: (cm) 4 Depth: (cm) 0.2 Volume: (cm) 2.513 Character of Wound/Ulcer Post Debridement: Stable Severity of Tissue Post Debridement: Fat layer exposed Post Procedure Diagnosis Same as Pre-procedure Electronic Signature(s) Signed: 10/22/2020 4:50:28 PM By: [name] MD Signed: 10/22/2020 5:49:27 PM By: Levan Hurst RN, BSN Entered By: [name] on 10/22/2020 11:55:57 -------------------------------------------------------------------------------- HPI Details Patient Name: Date of Service: [name] RD, [name] 10/22/2020 11:00 A M Medical Record Number: [id number] Patient Account Number: [id number] Date of Birth/Sex: Treating RN: 01-01-1971 (50 y.o. [name] Primary Care Provider: Geryl [name] Other Clinician: Referring Provider: Treating Provider/Extender: Lenetta Quaker in Treatment: 6 History of Present Illness HPI Description: ADMISSION 09/05/2020 This is a  50 year old man who has type 2 diabetes. Essentially the problem began admitted to [facility] health in December with infection gas gangrene. Cultures ultimately grew staph lugdunensis. 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 March Rummage. Shortly thereafter he developed a nonhealing wound with wound dehiscence noted on 05/09/2020. He had a wound VAC I think for a period of time after the surgery but it did not help. He has undergone an IandD and skin graft on the right foot on 05/16/2020. He underwent a  further IandD on 06/04/2020 not debrided to bone. 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 Culture of the metatarsal grew Enterobacter cloacae I. He has been followed by Dr. [name] of infectious disease. Currently on Levaquin and doxycycline I think at about the 4-week of 6 mark. At the last visit with Dr. March Rummage he was supposed to have use Santyl although I do not think he has it and he has been using I think a Betadine wet-to-dry. He is referred here by Dr. [name] for our review who saw him yesterday. I cannot see that he has had imaging studies of the right foot. No MRI Past medical history includes type 2 diabetes, in 2021 and ulcer of the left foot that ultimately healed with a wound VAC, stage III chronic kidney disease, first toe amputations bilaterally, hypertension, still a 1/2 pack/day smoker. The patient had ABIs on 08/09/2018 which was 1.00 on the right and 0.96 on the left both areas had triphasic waveforms. Not felt to have an arterial issue. 09/12/2020; 1 week follow-up. He has been doing the dressings along with home health. This is a deep punched out area in the incision of his transmit site. We use silver alginate last week to help with drainage and antibacterial properties. He tolerated this well 6/24; the patient was actually at his podiatrist this morning who debrided the wound. We have  been using silver alginate. They are going to follow him in 6 weeks. 6/29; patient is using silver alginate. Apparently saw Dr. March Rummage this week although I have not checked his note I will try to do so. He is following up in 6 weeks. This was the original transmetatarsal amputation when I first saw this 3 mid part of his amputation site was deep down to bone however this seems to have progressively close down which is gratifying to see. He still has a fairly callus uneven surface however spreading this apart its difficult to identify anything that does not look epithelialized. When I first saw this I thought he would require an extensive debridement perhaps back in the OR by Dr. March Rummage however all of this appears to be looking better and at this point I would continue with the silver alginate see if this holds together over the next several weeks. He does not have an arterial issue 7/13; 2-week follow-up. Unfortunately the area did not hold together there was drainage on his dressing. Still copious amounts of callus. It was difficult to see where this actually was open therefore I elected to go ahead with a vigorous debridement. Still using silver alginate 7/20 I brought him back after extensive callus debridement last week. The only open area was on the medial part of the foot extensive debridement of thick surrounding callus and I was able to do identify the remaining tunneling area. 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 This is the area that we may have been following. This is smaller this week but still surrounded with thick callus He came in today with a large area of callus on the left midfoot. This I had noticed previously. HOWEVER our intake nurse clearly incorrectly documented this as opening. The callus and split there was an odor so all of this had to be removed. Electronic Signature(s) Signed: 10/22/2020 4:50:28 PM By:  [name] MD Entered By: [name] on 10/22/2020 12:02:02 -------------------------------------------------------------------------------- Physical  Exam Details Patient Name: Date of Service: [name], [name] 10/22/2020 11:00 A M Medical Record Number: [id number] Patient Account Number: [id number] Date of Birth/Sex: Treating RN: 1970/04/11 (50 y.o. [name] Primary Care Provider: Geryl [name] Other Clinician: Referring Provider: Treating Provider/Extender: Tomasa Blase, [name] in Treatment: 6 Constitutional Sitting or standing Blood Pressure is within target range for patient.. Pulse regular and within target range for patient.Marland Kitchen Respirations regular, non-labored and within target range.. Temperature is normal and within the target range for the patient.Marland Kitchen Appears in no distress. Notes Wound exam; transmetatarsal amputation I carefully removed callus and some subcutaneous debris around the remaining wound area. Hemostasis with direct pressure. The wound itself appears healthy and well granulated On the left foot and extensive area of callus removed first with a #10 scalpel and pickups. Underneath a nonviable necrotic surface which I pared down with a #5 curette until we got to an open area. Some of this had normal surrounding what looks like skin however in the mid part of it clearly an open wound Electronic Signature(s) Signed: 10/22/2020 4:50:28 PM By: [name] MD Entered By: [name] on 10/22/2020 12:03:17 -------------------------------------------------------------------------------- Physician Orders Details Patient Name: Date of Service: [name], [place] [name] 10/22/2020 11:00 A M Medical Record Number: [id number] Patient Account Number: [id number] Date of Birth/Sex: Treating RN: [birth year] (50 y.o. [name] Primary Care Provider: Geryl [name] Other Clinician: Referring Provider: Treating Provider/Extender: Lenetta Quaker in Treatment: 6 Verbal / Phone Orders: No Diagnosis Coding ICD-10 Coding Code Description E11.621 Type 2 diabetes mellitus with foot ulcer L97.514 Non-pressure chronic ulcer of other part of right foot with necrosis of bone T81.31XS Disruption of external operation (surgical) wound, not elsewhere classified, sequela M86.671 Other chronic osteomyelitis, right ankle and foot Follow-up Appointments ppointment in 1 week. - with Jeri Cos, PA Return A Bathing/ Shower/ Hygiene May shower and wash wound with soap and water. - on days you change dressing Edema Control - Lymphedema / SCD / Other Avoid standing for long periods of time. Exercise regularly Off-Loading Open toe surgical shoe to: - Wear at all times except driving. Wedge shoe to: - T Left Foot o Additional Orders / Instructions Follow Nutritious Diet Non Wound Condition Other Non Wound Condition Orders/Instructions: - cushion bottom of left foot with orthopedic felt or foam [place] wound care orders this week; continue Home Health for wound care. May utilize formulary equivalent dressing for wound treatment orders unless otherwise specified. - New wound left foot Other Home Health Orders/Instructions: - [facility] Wound Treatment Wound #1 - Amputation Site - Transmetatarsal Wound Laterality: Right Cleanser: Soap and Water [facility]) Every Other Day/30 Days Discharge Instructions: May shower and wash wound with dial antibacterial soap and water prior to dressing change. Cleanser: Wound Cleanser Every Other Day/30 Days Discharge Instructions: Cleanse the wound with wound cleanser prior to applying a clean dressing using gauze sponges, not tissue or cotton balls. Prim Dressing: KerraCel Ag Gelling Fiber Dressing, 4x5 in (silver alginate) (Home Health) Every Other Day/30 Days ary Discharge Instructions: Pack into undermined area Secondary Dressing: Woven Gauze Sponge,  Non-Sterile 4x4 in [facility]) Every Other Day/30 Days Discharge Instructions: Apply over primary dressing as directed. Secondary Dressing: ABD Pad, 5x9 [address] [facility]) Every Other Day/30 Days Discharge Instructions: Apply over primary dressing as directed. Secured With: Coban Self-Adherent Wrap 4x5 (in/yd) Every Other Day/30 Days Discharge Instructions: Secure with Coban as directed. Secured With: [employer]  Sterile, 4.5x3.1 (in/yd) [facility]) Every Other Day/30 Days Discharge Instructions: Secure with Kerlix as directed. Secured With: Transpore Surgical Tape, 2x10 (in/yd) [facility]) Every Other Day/30 Days Discharge Instructions: Secure dressing with tape as directed. Wound #2 - Foot Wound Laterality: Plantar, Left Cleanser: Soap and Water [facility]) Every Other Day/30 Days Discharge Instructions: May shower and wash wound with dial antibacterial soap and water prior to dressing change. Cleanser: Wound Cleanser Every Other Day/30 Days Discharge Instructions: Cleanse the wound with wound cleanser prior to applying a clean dressing using gauze sponges, not tissue or cotton balls. Prim Dressing: KerraCel Ag Gelling Fiber Dressing, 4x5 in (silver alginate) (Home Health) Every Other Day/30 Days ary Discharge Instructions: Pack into undermined area Secondary Dressing: Woven Gauze Sponge, Non-Sterile 4x4 in [facility]) Every Other Day/30 Days Discharge Instructions: Apply over primary dressing as directed. Secondary Dressing: ABD Pad, 5x9 [facility]) Every Other Day/30 Days Discharge Instructions: Apply over primary dressing as directed. Secured With: Coban Self-Adherent Wrap 4x5 (in/yd) Every Other Day/30 Days Discharge Instructions: Secure with Coban as directed. Secured With: [employer], 4.5x3.1 (in/yd) [facility]) Every Other Day/30 Days Discharge Instructions: Secure with Kerlix as directed. Secured With: Transpore Surgical Tape, 2x10 (in/yd) [facility]) Every  Other Day/30 Days Discharge Instructions: Secure dressing with tape as directed. Electronic Signature(s) Signed: 10/22/2020 4:50:28 PM By: [name] MD Signed: 10/22/2020 5:58:16 PM By: Lorrin Jackson Entered By: Lorrin Jackson on 10/22/2020 11:54:14 -------------------------------------------------------------------------------- Problem List Details Patient Name: Date of Service: [name], [place] [name] 10/22/2020 11:00 A M Medical Record Number: [id number] Patient Account Number: [id number] Date of Birth/Sex: Treating RN: Jan 10, 1971 (50 y.o. [name] Primary Care Provider: Geryl [name] Other Clinician: Referring Provider: Treating Provider/Extender: Lenetta Quaker in Treatment: 6 Active Problems ICD-10 Encounter Code Description Active Date MDM Diagnosis E11.621 Type 2 diabetes mellitus with foot ulcer 09/05/2020 No Yes L97.514 Non-pressure chronic ulcer of other part of right foot with necrosis of bone 09/05/2020 No Yes T81.31XS Disruption of external operation (surgical) wound, not elsewhere classified, 09/05/2020 No Yes sequela L97.528 Non-pressure chronic ulcer of other part of left foot with other specified 10/22/2020 No Yes severity Inactive Problems ICD-10 Code Description Active Date Inactive Date M86.671 Other chronic osteomyelitis, right ankle and foot 09/05/2020 09/05/2020 Resolved Problems Electronic Signature(s) Signed: 10/22/2020 4:50:28 PM By: [name] MD Entered By: [name] on 10/22/2020 11:55:21 -------------------------------------------------------------------------------- Progress Note Details Patient Name: Date of Service: [name], [place] [name] 10/22/2020 11:00 A M Medical Record Number: [id number] Patient Account Number: [id number] Date of Birth/Sex: Treating RN: 01-29-1971 (50 y.o. [name] Primary Care Provider: Geryl [name] Other Clinician: Referring Provider: Treating Provider/Extender:  Tomasa Blase, [name] in Treatment: 6 Subjective History of Present Illness (HPI) ADMISSION 09/05/2020 This is a 50 year old man who has type 2 diabetes. Essentially the problem began admitted to [facility] in December with infection gas gangrene. Cultures ultimately grew staph lugdunensis. 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 March Rummage. Shortly thereafter he developed a nonhealing wound with wound dehiscence noted on 05/09/2020. He had a wound VAC I think for a period of time after the surgery but it did not help. He has undergone an IandD and skin graft on the right foot on 05/16/2020. He underwent a further IandD on 06/04/2020 not debrided to bone. 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 Culture of the  metatarsal grew Enterobacter cloacae I. He has been followed by Dr. [name] of infectious disease. Currently on Levaquin and doxycycline I think at about the 4-week of 6 mark. At the last visit with Dr. March Rummage he was supposed to have use Santyl although I do not think he has it and he has been using I think a Betadine wet-to-dry. He is referred here by Dr. [name] for our review who saw him yesterday. I cannot see that he has had imaging studies of the right foot. No MRI Past medical history includes type 2 diabetes, in 2021 and ulcer of the left foot that ultimately healed with a wound VAC, stage III chronic kidney disease, first toe amputations bilaterally, hypertension, still a 1/2 pack/day smoker. The patient had ABIs on 08/09/2018 which was 1.00 on the right and 0.96 on the left both areas had triphasic waveforms. Not felt to have an arterial issue. 09/12/2020; 1 week follow-up. He has been doing the dressings along with home health. This is a deep punched out area in the incision of his transmit site. We use silver alginate last week to help with drainage and  antibacterial properties. He tolerated this well 6/24; the patient was actually at his podiatrist this morning who debrided the wound. We have been using silver alginate. They are going to follow him in 6 weeks. 6/29; patient is using silver alginate. Apparently saw Dr. March Rummage this week although I have not checked his note I will try to do so. He is following up in 6 weeks. This was the original transmetatarsal amputation when I first saw this 3 mid part of his amputation site was deep down to bone however this seems to have progressively close down which is gratifying to see. He still has a fairly callus uneven surface however spreading this apart its difficult to identify anything that does not look epithelialized. When I first saw this I thought he would require an extensive debridement perhaps back in the OR by Dr. March Rummage however all of this appears to be looking better and at this point I would continue with the silver alginate see if this holds together over the next several weeks. He does not have an arterial issue 7/13; 2-week follow-up. Unfortunately the area did not hold together there was drainage on his dressing. Still copious amounts of callus. It was difficult to see where this actually was open therefore I elected to go ahead with a vigorous debridement. Still using silver alginate 7/20 I brought him back after extensive callus debridement last week. The only open area was on the medial part of the foot extensive debridement of thick surrounding callus and I was able to do identify the remaining tunneling area. 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 This is the area that we may have been following. This is smaller this week but still surrounded with thick callus He came in today with a large area of callus on the left midfoot. This I had noticed previously. HOWEVER our intake nurse clearly incorrectly documented this as  opening. The callus and split there was an odor so all of this had to be removed. Objective Constitutional Sitting or standing Blood Pressure is within target range for patient.. Pulse regular and within target range for patient.Marland Kitchen Respirations regular, non-labored and within target range.. Temperature is normal and within the target range for the patient.Marland Kitchen Appears in no distress. Vitals Time Taken: 11:23 AM, Height:  64 in, Weight: 188 lbs, BMI: 32.3, Temperature: 97.9 F, Pulse: 85 bpm, Respiratory Rate: 16 breaths/min, Blood Pressure: 143/81 mmHg, Capillary Blood Glucose: 200 mg/dl. General Notes: Wound exam; transmetatarsal amputation I carefully removed callus and some subcutaneous debris around the remaining wound area. Hemostasis with direct pressure. The wound itself appears healthy and well granulated oo On the left foot and extensive area of callus removed first with a #10 scalpel and pickups. Underneath a nonviable necrotic surface which I pared down with a #5 curette until we got to an open area. Some of this had normal surrounding what looks like skin however in the mid part of it clearly an open wound Integumentary (Hair, Skin) Wound #1 status is Open. Original cause of wound was Surgical Injury. The date acquired was: 02/28/2020. The wound has been in treatment 6 weeks. The wound is located on the Right Amputation Site - Transmetatarsal. The wound measures 1cm length x 0.5cm width x 1.2cm depth; 0.393cm^2 area and 0.471cm^3 volume. There is Fat Layer (Subcutaneous Tissue) exposed. There is no tunneling or undermining noted. There is a medium amount of serosanguineous drainage noted. The wound margin is distinct with the outline attached to the wound base. There is large (67-100%) pink, pale granulation within the wound bed. There is no necrotic tissue within the wound bed. General Notes: calloused periwound Wound #2 status is Open. Original cause of wound was Gradually Appeared. The  date acquired was: 10/15/2020. The wound is located on the [place]. The wound measures 4cm length x 4cm width x 0.2cm depth; 12.566cm^2 area and 2.513cm^3 volume. There is Fat Layer (Subcutaneous Tissue) exposed. There is no tunneling or undermining noted. There is a medium amount of serosanguineous drainage noted. The wound margin is distinct with the outline attached to the wound base. There is large (67-100%) pink, pale granulation within the wound bed. There is no necrotic tissue within the wound bed. Assessment Active Problems ICD-10 Type 2 diabetes mellitus with foot ulcer Non-pressure chronic ulcer of other part of right foot with necrosis of bone Disruption of external operation (surgical) wound, not elsewhere classified, sequela Non-pressure chronic ulcer of other part of left foot with other specified severity Procedures Wound #1 Pre-procedure diagnosis of Wound #1 is a Diabetic Wound/Ulcer of the Lower Extremity located on the Right Amputation Site - Transmetatarsal .Severity of Tissue Pre Debridement is: Fat layer exposed. There was a Excisional Skin/Subcutaneous Tissue Debridement with a total area of 0.5 sq cm performed by Ricard Dillon., MD. With the following instrument(s): Curette to remove Non-Viable tissue/material. Material removed includes Callus and Subcutaneous Tissue and. No specimens were taken. A time out was conducted at 11:35, prior to the start of the procedure. A Minimum amount of bleeding was controlled with Pressure. The procedure was tolerated well. Post Debridement Measurements: 1cm length x 0.5cm width x 1.2cm depth; 0.471cm^3 volume. Character of Wound/Ulcer Post Debridement is stable. Severity of Tissue Post Debridement is: Fat layer exposed. Post procedure Diagnosis Wound #1: Same as Pre-Procedure Wound #2 Pre-procedure diagnosis of Wound #2 is a Diabetic Wound/Ulcer of the Lower Extremity located on the Left,Plantar Foot .Severity of Tissue Pre  Debridement is: Fat layer exposed. There was a Excisional Skin/Subcutaneous Tissue Debridement with a total area of 16 sq cm performed by Ricard Dillon., MD. With the following instrument(s): Blade, and Forceps to remove Non-Viable tissue/material. Material removed includes Callus and Subcutaneous Tissue and. No specimens were taken. A time out was conducted at 11:35, prior to the  start of the procedure. A Minimum amount of bleeding was controlled with Pressure. The procedure was tolerated well. Post Debridement Measurements: 4cm length x 4cm width x 0.2cm depth; 2.513cm^3 volume. Character of Wound/Ulcer Post Debridement is stable. Severity of Tissue Post Debridement is: Fat layer exposed. Post procedure Diagnosis Wound #2: Same as Pre-Procedure Plan Follow-up Appointments: Return Appointment in 1 week. - with Jeri Cos, PA Bathing/ Shower/ Hygiene: May shower and wash wound with soap and water. - on days you change dressing Edema Control - Lymphedema / SCD / Other: Avoid standing for long periods of time. Exercise regularly Off-Loading: Open toe surgical shoe to: - Wear at all times except driving. Wedge shoe to: - T Left Foot o Additional Orders / Instructions: Follow Nutritious Diet Non Wound Condition: Other Non Wound Condition Orders/Instructions: - cushion bottom of left foot with orthopedic felt or foam Home Health: New wound care orders this week; continue Home Health for wound care. May utilize formulary equivalent dressing for wound treatment orders unless otherwise specified. - New wound left foot Other Home Health Orders/Instructions: - [place] #1: - Amputation Site - Transmetatarsal Wound Laterality: Right Cleanser: Soap and Water [place] [facility]) Every Other Day/30 Days Discharge Instructions: May shower and wash wound with dial antibacterial soap and water prior to dressing change. Cleanser: Wound Cleanser Every Other Day/30 Days Discharge  Instructions: Cleanse the wound with wound cleanser prior to applying a clean dressing using gauze sponges, not tissue or cotton balls. Prim Dressing: KerraCel Ag Gelling Fiber Dressing, 4x5 in (silver alginate) (Home Health) Every Other Day/30 Days ary Discharge Instructions: Pack into undermined area Secondary Dressing: Woven Gauze Sponge, Non-Sterile 4x4 in [facility]) Every Other Day/30 Days Discharge Instructions: Apply over primary dressing as directed. Secondary Dressing: ABD Pad, 5x9 [facility]) Every Other Day/30 Days Discharge Instructions: Apply over primary dressing as directed. Secured With: Coban Self-Adherent Wrap 4x5 (in/yd) Every Other Day/30 Days Discharge Instructions: Secure with Coban as directed. Secured With: [employer], 4.5x3.1 (in/yd) [facility]) Every Other Day/30 Days Discharge Instructions: Secure with Kerlix as directed. Secured With: Transpore Surgical T ape, 2x10 (in/yd) (Home Health) Every Other Day/30 Days Discharge Instructions: Secure dressing with tape as directed. WOUND #2: - Foot Wound Laterality: Plantar, Left Cleanser: Soap and Water [facility]) Every Other Day/30 Days Discharge Instructions: May shower and wash wound with dial antibacterial soap and water prior to dressing change. Cleanser: Wound Cleanser Every Other Day/30 Days Discharge Instructions: Cleanse the wound with wound cleanser prior to applying a clean dressing using gauze sponges, not tissue or cotton balls. Prim Dressing: KerraCel Ag Gelling Fiber Dressing, 4x5 in (silver alginate) (Home Health) Every Other Day/30 Days ary Discharge Instructions: Pack into undermined area Secondary Dressing: Woven Gauze Sponge, Non-Sterile 4x4 in [facility]) Every Other Day/30 Days Discharge Instructions: Apply over primary dressing as directed. Secondary Dressing: ABD Pad, 5x9 [facility]) Every Other Day/30 Days Discharge Instructions: Apply over primary dressing as  directed. Secured With: Coban Self-Adherent Wrap 4x5 (in/yd) Every Other Day/30 Days Discharge Instructions: Secure with Coban as directed. Secured With: [employer], 4.5x3.1 (in/yd) [facility]) Every Other Day/30 Days Discharge Instructions: Secure with Kerlix as directed. Secured With: Transpore Surgical T ape, 2x10 (in/yd) (Home Health) Every Other Day/30 Days Discharge Instructions: Secure dressing with tape as directed. 1. Apply silver alginate to the new extensive area in the left midfoot. This is hopefully not going to take him long to epithelialize over but I removed an  extensive amount of callus and nonviable subcutaneous tissue. I have given him a forefoot off loader hopefully to shift his weight back on his heel 2. Still silver alginate to the area on the right TMA. This again has a lot of callus around the wound however he is enough to remove. The wound bed looks healthy and slightly smaller. 3. No evidence of infection in either area Electronic Signature(s) Signed: 10/22/2020 4:50:28 PM By: [name] MD Entered By: [name] on 10/22/2020 12:05:23 -------------------------------------------------------------------------------- SuperBill Details Patient Name: Date of Service: [name] RD, [name] 10/22/2020 Medical Record Number: [id number] Patient Account Number: [id number] Date of Birth/Sex: Treating RN: 08/17/1970 (50 y.o. [name] Primary Care Provider: Geryl [name] Other Clinician: Referring Provider: Treating Provider/Extender: Lenetta Quaker in Treatment: 6 Diagnosis Coding ICD-10 Codes Code Description E11.621 Type 2 diabetes mellitus with foot ulcer L97.514 Non-pressure chronic ulcer of other part of right foot with necrosis of bone T81.31XS Disruption of external operation (surgical) wound, not elsewhere classified, sequela L97.528 Non-pressure chronic ulcer of other part of left foot with other specified  severity Facility Procedures CPT4 Code: 93241991 Description: 44458 - DEB SUBQ TISSUE 20 SQ CM/< ICD-10 Diagnosis Description 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

## 2020-10-22 NOTE — Progress Notes (Signed)
KELLY, EISLER ([id number]) Visit Report for 10/22/2020 Arrival Information Details Patient Name: Date of Service: [address] RD, [name] 10/22/2020 11:00 A M Medical Record Number: [id number] Patient Account Number: [id number] Date of Birth/Sex: Treating RN: 01-Sep-1970 (50 y.o. [name] Primary Care Tobby Fawcett: [name] Other Clinician: Referring Kirston Luty: Treating Amadi Yoshino/Extender: Lenetta Quaker in Treatment: 6 Visit Information History Since Last Visit Added or deleted any medications: No Patient Arrived: [name] Any new allergies or adverse reactions: No Arrival Time: 11:20 Had a fall or experienced change in No Transfer Assistance: None activities of daily living that may affect Patient Identification Verified: Yes risk of falls: Secondary Verification Process Completed: Yes Signs or symptoms of abuse/neglect since last visito No Patient Requires Transmission-Based Precautions: No Hospitalized since last visit: No Patient Has Alerts: No Implantable device outside of the clinic excluding No cellular tissue based products placed in the center since last visit: Has Dressing in Place as Prescribed: Yes Pain Present Now: No Electronic Signature(s) Signed: 10/22/2020 5:58:16 PM By: Lorrin Jackson Entered By: Lorrin Jackson on 10/22/2020 11:23:38 -------------------------------------------------------------------------------- Encounter Discharge Information Details Patient Name: Date of Service: [name], [place] [name] 10/22/2020 11:00 A M Medical Record Number: [id number] Patient Account Number: [id number] Date of Birth/Sex: Treating RN: 26-Apr-1970 (50 y.o. [name] Primary Care Nael Petrosyan: [name] Other Clinician: Referring Skylee Baird: Treating Christian Borgerding/Extender: Lenetta Quaker in Treatment: 6 Encounter Discharge Information Items Post Procedure Vitals Discharge Condition: Stable Temperature (F):  97.9 Ambulatory Status: Cane Pulse (bpm): 85 Discharge Destination: Home Respiratory Rate (breaths/min): 16 Transportation: Private Auto Blood Pressure (mmHg): 143/81 Schedule Follow-up Appointment: Yes Clinical Summary of Care: Provided on 10/22/2020 Form Type Recipient Paper Patient Patient Electronic Signature(s) Signed: 10/22/2020 5:58:16 PM By: Lorrin Jackson Entered By: Lorrin Jackson on 10/22/2020 12:10:06 -------------------------------------------------------------------------------- Lower Extremity Assessment Details Patient Name: Date of Service: [name], [name] 10/22/2020 11:00 A M Medical Record Number: [id number] Patient Account Number: [id number] Date of Birth/Sex: Treating RN: Nov 16, 1970 (50 y.o. [name] Primary Care Christiann Hagerty: [name] Other Clinician: Referring Clotine Heiner: Treating Demi Trieu/Extender: Tomasa Blase, [name] in Treatment: 6 Edema Assessment Assessed: [Left: No] [Right: No] Edema: [Left: N] [Right: o] Calf Left: Right: Point of Measurement: From Medial Instep 34 cm Ankle Left: Right: Point of Measurement: From Medial Instep 23 cm Vascular Assessment Pulses: Dorsalis Pedis Palpable: [Right:Yes] Electronic Signature(s) Signed: 10/22/2020 5:58:16 PM By: Lorrin Jackson Entered By: Lorrin Jackson on 10/22/2020 11:36:12 -------------------------------------------------------------------------------- Multi Wound Chart Details Patient Name: Date of Service: [name], [name] 10/22/2020 11:00 A M Medical Record Number: [id number] Patient Account Number: [id number] Date of Birth/Sex: Treating RN: 10-28-1970 (50 y.o. [name] Primary Care Brodyn Depuy: [name] Other Clinician: Referring Anthonella Klausner: Treating Coleman Kalas/Extender: Tomasa Blase, [name] in Treatment: 6 Vital Signs Height(in): 22 Capillary Blood Glucose(mg/dl): 200 Weight(lbs): 188 Pulse(bpm): 70 Body Mass Index(BMI):  77 Blood Pressure(mmHg): 143/81 Temperature(F): 97.9 Respiratory Rate(breaths/min): 16 Photos: [1:Right Amputation Site -] [2:Left, Plantar Foot] [N/A:N/A N/A] Wound Location: [1:Transmetatarsal Surgical Injury] [2:Gradually Appeared] [N/A:N/A] Wounding Event: [1:Diabetic Wound/Ulcer of the Lower] [2:Diabetic Wound/Ulcer of the Lower] [N/A:N/A] Primary Etiology: [1:Extremity Open Surgical Wound] [2:Extremity N/A] [N/A:N/A] Secondary Etiology: [1:Anemia, Coronary Artery Disease,] [2:Anemia, Coronary Artery Disease,] [N/A:N/A] Comorbid History: [1:Hypertension, Type II Diabetes, Osteomyelitis, Neuropathy 02/28/2020] [2:Hypertension, Type II Diabetes, Osteomyelitis, Neuropathy 10/15/2020] [N/A:N/A] Date Acquired: [1:6] [2:0] [N/A:N/A] Weeks of Treatment: [1:Open] [2:Open] [N/A:N/A] Wound Status: [1:1x0.5x1.2] [2:4x4x0.2] [N/A:N/A] Measurements L x W x D (cm) [1:0.393] [2:12.566] [  N/A:N/A] A (cm) : rea [1:0.471] [2:2.513] [N/A:N/A] Volume (cm) : [1:67.50%] [2:-1500.80%] [N/A:N/A] % Reduction in A [1:rea: 51.30%] [2:-1500.60%] [N/A:N/A] % Reduction in Volume: [1:Grade 3] [2:Grade 1] [N/A:N/A] Classification: [1:Medium] [2:Medium] [N/A:N/A] Exudate A mount: [1:Serosanguineous] [2:Serosanguineous] [N/A:N/A] Exudate Type: [1:red, brown] [2:red, brown] [N/A:N/A] Exudate Color: [1:Distinct, outline attached] [2:Distinct, outline attached] [N/A:N/A] Wound Margin: [1:Large (67-100%)] [2:Large (67-100%)] [N/A:N/A] Granulation A mount: [1:Pink, Pale] [2:Pink, Pale] [N/A:N/A] Granulation Quality: [1:None Present (0%)] [2:None Present (0%)] [N/A:N/A] Necrotic A mount: [1:Fat Layer (Subcutaneous Tissue): Yes Fat Layer (Subcutaneous Tissue): Yes N/A] Exposed Structures: [1:Fascia: No Tendon: No Muscle: No Joint: No Bone: No Medium (34-66%)] [2:Fascia: No Tendon: No Muscle: No Joint: No Bone: No None] [N/A:N/A] Epithelialization: [1:Debridement - Excisional] [2:Debridement - Excisional]  [N/A:N/A] Debridement: Pre-procedure Verification/Time Out 11:35 [2:11:35] [N/A:N/A] Taken: [1:Callus, Subcutaneous] [2:Callus, Subcutaneous] [N/A:N/A] Tissue Debrided: [1:Skin/Subcutaneous Tissue] [2:Skin/Subcutaneous Tissue] [N/A:N/A] Level: [1:0.5] [2:16] [N/A:N/A] Debridement A (sq cm): [1:rea Curette] [2:Blade, Forceps] [N/A:N/A] Instrument: [1:Minimum] [2:Minimum] [N/A:N/A] Bleeding: [1:Pressure] [2:Pressure] [N/A:N/A] Hemostasis A chieved: [1:Procedure was tolerated well] [2:Procedure was tolerated well] [N/A:N/A] Debridement Treatment Response: [1:1x0.5x1.2] [2:4x4x0.2] [N/A:N/A] Post Debridement Measurements L x W x D (cm) [1:0.471] [2:2.513] [N/A:N/A] Post Debridement Volume: (cm) [1:calloused periwound] [2:N/A] [N/A:N/A] Assessment Notes: [1:Debridement] [2:Debridement] [N/A:N/A] Treatment Notes Electronic Signature(s) Signed: 10/22/2020 4:50:28 PM By: [name] MD Signed: 10/22/2020 5:49:27 PM By: Levan Hurst RN, BSN Entered By: [name] on 10/22/2020 11:55:30 -------------------------------------------------------------------------------- Multi-Disciplinary Care Plan Details Patient Name: Date of Service: [name], [name] 10/22/2020 11:00 A M Medical Record Number: [id number] Patient Account Number: [id number] Date of Birth/Sex: Treating RN: [birth year] (50 y.o. [name] Primary Care Isadore Palecek: [name] Other Clinician: Referring Aireal Slater: Treating Jamaris Theard/Extender: Tomasa Blase, [name] in Treatment: 6 Active Inactive Wound/Skin Impairment Nursing Diagnoses: Impaired tissue integrity Goals: Patient/caregiver will verbalize understanding of skin care regimen Date Initiated: 09/05/2020 Target Resolution Date: 11/07/2020 Goal Status: Active Ulcer/skin breakdown will have a volume reduction of 30% by week 4 Date Initiated: 09/05/2020 Date Inactivated: 10/08/2020 Target Resolution Date: 10/03/2020 Goal Status:  Met Interventions: Assess patient/caregiver ability to obtain necessary supplies Assess patient/caregiver ability to perform ulcer/skin care regimen upon admission and as needed Assess ulceration(s) every visit Provide education on ulcer and skin care Treatment Activities: Patient referred to home care : 09/05/2020 Skin care regimen initiated : 09/05/2020 Topical wound management initiated : 09/05/2020 Notes: Electronic Signature(s) Signed: 10/22/2020 5:58:16 PM By: Lorrin Jackson Entered By: Lorrin Jackson on 10/22/2020 11:36:29 -------------------------------------------------------------------------------- Pain Assessment Details Patient Name: Date of Service: [name], [name] 10/22/2020 11:00 A M Medical Record Number: [id number] Patient Account Number: [id number] Date of Birth/Sex: Treating RN: 1971-01-21 (50 y.o. [name] Primary Care Edith Groleau: [name] Other Clinician: Referring Ericha Whittingham: Treating Teresita Fanton/Extender: Lenetta Quaker in Treatment: 6 Active Problems Location of Pain Severity and Description of Pain Patient Has Paino No Site Locations Pain Management and Medication Current Pain Management: Electronic Signature(s) Signed: 10/22/2020 5:58:16 PM By: Lorrin Jackson Entered By: Lorrin Jackson on 10/22/2020 11:27:40 -------------------------------------------------------------------------------- Patient/Caregiver Education Details Patient Name: Date of Service: [name], [name] 7/27/2022andnbsp11:00 A M Medical Record Number: [id number] Patient Account Number: [id number] Date of Birth/Gender: Treating RN: [birth year] (50 y.o. [name] Primary Care Physician: [name] Other Clinician: Referring Physician: Treating Physician/Extender: Lenetta Quaker in Treatment: 6 Education Assessment Education Provided To: Patient Education Topics Provided Elevated Blood Sugar/ Impact on  Healing: Methods: Explain/Verbal Responses: State content correctly Wound/Skin Impairment: Methods: Explain/Verbal, Printed  Responses: State content correctly Electronic Signature(s) Signed: 10/22/2020 5:58:16 PM By: Lorrin Jackson Entered By: Lorrin Jackson on 10/22/2020 11:36:56 -------------------------------------------------------------------------------- Wound Assessment Details Patient Name: Date of Service: [name], [place] [name] 10/22/2020 11:00 A M Medical Record Number: [id number] Patient Account Number: [id number] Date of Birth/Sex: Treating RN: 12/15/1970 (50 y.o. [name] Primary Care Lynea Rollison: [name] Other Clinician: Referring Jaecob Lowden: Treating Alyn Jurney/Extender: Tomasa Blase, [name] in Treatment: 6 Wound Status Wound Number: 1 Primary Diabetic Wound/Ulcer of the Lower Extremity Etiology: Wound Location: Right Amputation Site - Transmetatarsal Secondary Open Surgical Wound Wounding Event: Surgical Injury Etiology: Date Acquired: 02/28/2020 Wound Open Weeks Of Treatment: 6 Status: Clustered Wound: No Comorbid Anemia, Coronary Artery Disease, Hypertension, Type II History: Diabetes, Osteomyelitis, Neuropathy Photos Wound Measurements Length: (cm) 1 Width: (cm) 0.5 Depth: (cm) 1.2 Area: (cm) 0.393 Volume: (cm) 0.471 % Reduction in Area: 67.5% % Reduction in Volume: 51.3% Epithelialization: Medium (34-66%) Tunneling: No Undermining: No Wound Description Classification: Grade 3 Wound Margin: Distinct, outline attached Exudate Amount: Medium Exudate Type: Serosanguineous Exudate Color: red, brown Foul Odor After Cleansing: No Slough/Fibrino No Wound Bed Granulation Amount: Large (67-100%) Exposed Structure Granulation Quality: Pink, Pale Fascia Exposed: No Necrotic Amount: None Present (0%) Fat Layer (Subcutaneous Tissue) Exposed: Yes Tendon Exposed: No Muscle Exposed: No Joint Exposed: No Bone Exposed:  No Assessment Notes calloused periwound Treatment Notes Wound #1 (Amputation Site - Transmetatarsal) Wound Laterality: Right Cleanser Soap and Water Discharge Instruction: May shower and wash wound with dial antibacterial soap and water prior to dressing change. Wound Cleanser Discharge Instruction: Cleanse the wound with wound cleanser prior to applying a clean dressing using gauze sponges, not tissue or cotton balls. Peri-Wound Care Topical Primary Dressing KerraCel Ag Gelling Fiber Dressing, 4x5 in (silver alginate) Discharge Instruction: Pack into undermined area Secondary Dressing Woven Gauze Sponge, Non-Sterile 4x4 in Discharge Instruction: Apply over primary dressing as directed. ABD Pad, 5x9 Discharge Instruction: Apply over primary dressing as directed. Secured With [employer] 4x5 (in/yd) Discharge Instruction: Secure with Coban as directed. Kerlix Roll Sterile, 4.5x3.1 (in/yd) Discharge Instruction: Secure with Kerlix as directed. Transpore Surgical Tape, 2x10 (in/yd) Discharge Instruction: Secure dressing with tape as directed. Compression Wrap Compression Stockings Add-Ons Electronic Signature(s) Signed: 10/22/2020 5:58:16 PM By: Lorrin Jackson Entered By: Lorrin Jackson on 10/22/2020 11:30:05 -------------------------------------------------------------------------------- Wound Assessment Details Patient Name: Date of Service: [name], [name] 10/22/2020 11:00 A M Medical Record Number: [id number] Patient Account Number: [id number] Date of Birth/Sex: Treating RN: Jan 01, 1971 (50 y.o. [name] Primary Care Creedence Kunesh: [name] Other Clinician: Referring Shaynah Hund: Treating Ethyl Vila/Extender: Tomasa Blase, [name] in Treatment: 6 Wound Status Wound Number: 2 Primary Diabetic Wound/Ulcer of the Lower Extremity Etiology: Wound Location: Left, Plantar Foot Wound Open Wounding Event: Gradually Appeared Status: Date  Acquired: 10/15/2020 Comorbid Anemia, Coronary Artery Disease, Hypertension, Type II Weeks Of Treatment: 0 History: Diabetes, Osteomyelitis, Neuropathy Clustered Wound: No Photos Wound Measurements Length: (cm) 4 Width: (cm) 4 Depth: (cm) 0.2 Area: (cm) 12.566 Volume: (cm) 2.513 % Reduction in Area: -1500.8% % Reduction in Volume: -1500.6% Epithelialization: None Tunneling: No Undermining: No Wound Description Classification: Grade 1 Wound Margin: Distinct, outline attached Exudate Amount: Medium Exudate Type: Serosanguineous Exudate Color: red, brown Foul Odor After Cleansing: No Slough/Fibrino No Wound Bed Granulation Amount: Large (67-100%) Exposed Structure Granulation Quality: Pink, Pale Fascia Exposed: No Necrotic Amount: None Present (0%) Fat Layer (Subcutaneous Tissue) Exposed: Yes Tendon Exposed: No Muscle Exposed: No Joint Exposed: No Bone Exposed:  No Treatment Notes Wound #2 (Foot) Wound Laterality: Plantar, Left Cleanser Soap and Water Discharge Instruction: May shower and wash wound with dial antibacterial soap and water prior to dressing change. Wound Cleanser Discharge Instruction: Cleanse the wound with wound cleanser prior to applying a clean dressing using gauze sponges, not tissue or cotton balls. Peri-Wound Care Topical Primary Dressing KerraCel Ag Gelling Fiber Dressing, 4x5 in (silver alginate) Discharge Instruction: Pack into undermined area Secondary Dressing Woven Gauze Sponge, Non-Sterile 4x4 in Discharge Instruction: Apply over primary dressing as directed. ABD Pad, 5x9 Discharge Instruction: Apply over primary dressing as directed. Secured With [employer] 4x5 (in/yd) Discharge Instruction: Secure with Coban as directed. Kerlix Roll Sterile, 4.5x3.1 (in/yd) Discharge Instruction: Secure with Kerlix as directed. Transpore Surgical Tape, 2x10 (in/yd) Discharge Instruction: Secure dressing with tape as  directed. Compression Wrap Compression Stockings Add-Ons Electronic Signature(s) Signed: 10/22/2020 5:58:16 PM By: Lorrin Jackson Entered By: Lorrin Jackson on 10/22/2020 11:51:03 -------------------------------------------------------------------------------- Vitals Details Patient Name: Date of Service: [name], [name] 10/22/2020 11:00 A M Medical Record Number: [id number] Patient Account Number: [id number] Date of Birth/Sex: Treating RN: 10-13-1970 (50 y.o. [name] Primary Care Jeroline Wolbert: [name] Other Clinician: Referring Kyarra Vancamp: Treating Macoy Rodwell/Extender: Lenetta Quaker in Treatment: 6 Vital Signs Time Taken: 11:23 Temperature (F): 97.9 Height (in): 64 Pulse (bpm): 85 Weight (lbs): 188 Respiratory Rate (breaths/min): 16 Body Mass Index (BMI): 32.3 Blood Pressure (mmHg): 143/81 Capillary Blood Glucose (mg/dl): 200 Reference Range: 80 - 120 mg / dl Electronic Signature(s) Signed: 10/22/2020 5:58:16 PM By: Lorrin Jackson Entered By: Lorrin Jackson on 10/22/2020 11:24:09

## 2020-10-23 ENCOUNTER — Ambulatory Visit: Payer: Medicaid Other | Attending: Nurse Practitioner | Admitting: Pharmacist

## 2020-10-23 ENCOUNTER — Other Ambulatory Visit: Payer: Self-pay

## 2020-10-23 ENCOUNTER — Telehealth: Payer: Self-pay

## 2020-10-23 ENCOUNTER — Encounter: Payer: Self-pay | Admitting: Pharmacist

## 2020-10-23 DIAGNOSIS — Z794 Long term (current) use of insulin: Secondary | ICD-10-CM

## 2020-10-23 DIAGNOSIS — E1142 Type 2 diabetes mellitus with diabetic polyneuropathy: Secondary | ICD-10-CM | POA: Diagnosis not present

## 2020-10-23 LAB — GLUCOSE, POCT (MANUAL RESULT ENTRY): POC Glucose: 226 mg/dl — AB (ref 70–99)

## 2020-10-23 MED ORDER — TRULICITY 0.75 MG/0.5ML ~~LOC~~ SOAJ
0.7500 mg | SUBCUTANEOUS | 0 refills | Status: DC
  Filled 2020-10-23: qty 2, 28d supply, fill #0

## 2020-10-23 NOTE — Telephone Encounter (Signed)
PA for Trulicity approved until 10/23/2021

## 2020-10-23 NOTE — Telephone Encounter (Signed)
Thank you :)

## 2020-10-23 NOTE — Progress Notes (Signed)
    S:    PCP: Zelda   No chief complaint on file.  Patient arrives in good spirits.  Presents for diabetes evaluation, education, and management. Patient was referred and last seen by Primary Care Provider on 09/23/2020.   Patient reports diabetes is longstanding. He denies any hx of pancreatitis. No history of thyroid cancer. He does have a hx CAD. He denies any hx of ACS or stroke. No hx of CHF. He does have CKD. Last renal function on 10/17/20 showed a creatinine 3.14 mg/dL. eGFR estimated 25-27 ml/min/1.37m^2.   Family/Social History:  -Fhx: DM, HTN, HLD -Tobacco: 0.5 PPD smoker  -Alcohol: none reported  Insurance coverage/medication affordability: [place] Medicaid  Medication adherence reported.   Current diabetes medications include: Humalog sliding scale, Lantus 15 units BID Current hypertension medications include: amlodipine 10 mg daily clonidine 0.1 mg BID, carvedilol 25 mg BID Current hyperlipidemia medications include: atorvastatin 20 mg daily  Patient denies hypoglycemic events.  Patient reported dietary habits:  - Patient admits to dietary indiscretion  - He had pizza last night  - He saw a nutritionist on 10/15/2020. He says he is trying to implement changes   Patient-reported exercise habits:  - Limited d/t limited mobility    Patient denies nocturia (nighttime urination).  Patient denies neuropathy (nerve pain). Patient denies visual changes. Patient reports self foot exams.     O:  POCT: 226   Lab Results  Component Value Date   HGBA1C 10.8 (A) 09/23/2020   There were no vitals filed for this visit.  Lipid Panel     Component Value Date/Time   CHOL 97 (L) 10/10/2019 0958   TRIG 43 10/10/2019 0958   HDL 47 10/10/2019 0958   CHOLHDL 2.1 10/10/2019 0958   CHOLHDL 3 10/17/2018 1441   VLDL 18.0 10/17/2018 1441   LDLCALC 39 10/10/2019 0958    Home fasting blood sugars: reports 200s. Does not have his meter with him today.    Clinical Atherosclerotic  Cardiovascular Disease (ASCVD): No  The ASCVD Risk score Mikey Bussing DC Jr., et al., 2013) failed to calculate for the following reasons:   The valid total cholesterol range is 130 to 320 mg/dL   A/P: Diabetes longstanding currently uncontrolled. Patient is able to verbalize appropriate hypoglycemia management plan. Medication adherence appears appropriate. CKD is a compelling indication for SGLT-2 inhibition, however, his eGFR is contraindicative to starting an SGLT-2i at this  time. We had a discussion about addition of Trulicity and he is amenable to this.  -Started Trulicity 4.91 mg weekly.  -Continued Lantus 15 units BID. -Extensively discussed pathophysiology of diabetes, recommended lifestyle interventions, dietary effects on blood sugar control -Counseled on s/sx of and management of hypoglycemia -Next A1C anticipated 11/2020.   Written patient instructions provided.  Total time in face to face counseling 30 minutes.   Follow up Pharmacist Clinic Visit in 4 weeks.    Benard Halsted, PharmD, [facility], [place] [phone]

## 2020-10-24 ENCOUNTER — Other Ambulatory Visit: Payer: Self-pay

## 2020-10-24 ENCOUNTER — Telehealth: Payer: Self-pay | Admitting: Nurse Practitioner

## 2020-10-24 NOTE — Telephone Encounter (Signed)
Attempted to call patient but call kept dropping when patient answered.   Letter can not be written at this time, he will need to report to [employer].

## 2020-10-24 NOTE — Telephone Encounter (Signed)
Pt requesting a letter for [employer] in [facility] stating  he's in a wheelchair. Needs it by Friday 29TH OF AUG.  (PT is currently not in a wheelchair) pt states he wants to get out of it.  PT asked if he can get a call when this is ready for pick up.

## 2020-10-27 ENCOUNTER — Encounter: Payer: Medicare Other | Attending: Physician Assistant | Admitting: Nutrition

## 2020-10-27 ENCOUNTER — Other Ambulatory Visit: Payer: Self-pay

## 2020-10-27 ENCOUNTER — Other Ambulatory Visit: Payer: Self-pay | Admitting: Nurse Practitioner

## 2020-10-27 DIAGNOSIS — E1142 Type 2 diabetes mellitus with diabetic polyneuropathy: Secondary | ICD-10-CM

## 2020-10-27 DIAGNOSIS — E1149 Type 2 diabetes mellitus with other diabetic neurological complication: Secondary | ICD-10-CM | POA: Diagnosis not present

## 2020-10-27 DIAGNOSIS — Z794 Long term (current) use of insulin: Secondary | ICD-10-CM

## 2020-10-27 DIAGNOSIS — E1165 Type 2 diabetes mellitus with hyperglycemia: Secondary | ICD-10-CM | POA: Diagnosis present

## 2020-10-27 DIAGNOSIS — IMO0002 Reserved for concepts with insufficient information to code with codable children: Secondary | ICD-10-CM

## 2020-10-27 MED ORDER — FREESTYLE LIBRE SENSOR SYSTEM MISC: 99 refills | Status: DC

## 2020-10-27 MED ORDER — FREESTYLE LIBRE READER DEVI: 12 refills | Status: DC

## 2020-10-27 NOTE — Progress Notes (Signed)
Patient is here today to review blood sugar readings.  He reported that the [place] gave him ended and he did not contact his MD for a prescription for this, and wanted another sample.  I only the the [place] 2 samples, which must be read on his phone.  However, his phone is not capable to download the app for this new sensor.  HE was shown again how to apply and start the sensor, and reported good understanding of this.   He did not bring his meter, but says his blood sugars are in the 180s most of the time. I will message MS. Flemming to order this for him. Insulin dose: Lantus BID.  He says he is taking this every day without missing any doses. He reports taking Trulicity once a week, and took this yesterday Exercise:  says this is hard due to "feet problems" he is having.  Gave him list of chair exercises to do, and stressed the need for this daily. He reports having seen the dietitian and he has gotten "further insight into his diet".   He had no final questions.

## 2020-10-27 NOTE — Patient Instructions (Signed)
Do 15-20 minutes of exercise every day. Use Freestyle Libre sensor every 14 days. Call if questions

## 2020-10-29 ENCOUNTER — Other Ambulatory Visit (HOSPITAL_COMMUNITY): Payer: Self-pay | Admitting: Physician Assistant

## 2020-10-29 ENCOUNTER — Encounter (HOSPITAL_BASED_OUTPATIENT_CLINIC_OR_DEPARTMENT_OTHER): Payer: Medicare Other | Attending: Physician Assistant | Admitting: Physician Assistant

## 2020-10-29 ENCOUNTER — Other Ambulatory Visit: Payer: Self-pay

## 2020-10-29 ENCOUNTER — Other Ambulatory Visit: Payer: Self-pay | Admitting: Physician Assistant

## 2020-10-29 DIAGNOSIS — L97528 Non-pressure chronic ulcer of other part of left foot with other specified severity: Secondary | ICD-10-CM | POA: Insufficient documentation

## 2020-10-29 DIAGNOSIS — E11621 Type 2 diabetes mellitus with foot ulcer: Secondary | ICD-10-CM | POA: Insufficient documentation

## 2020-10-29 DIAGNOSIS — L97514 Non-pressure chronic ulcer of other part of right foot with necrosis of bone: Secondary | ICD-10-CM

## 2020-10-29 DIAGNOSIS — F17200 Nicotine dependence, unspecified, uncomplicated: Secondary | ICD-10-CM | POA: Diagnosis not present

## 2020-10-29 DIAGNOSIS — E1122 Type 2 diabetes mellitus with diabetic chronic kidney disease: Secondary | ICD-10-CM | POA: Insufficient documentation

## 2020-10-29 NOTE — Progress Notes (Signed)
[name], [name]. ([id number]) Visit Report for 10/29/2020 Arrival Information Details Patient Name: Date of Service: [name], Joseph RCUS [name]. 10/29/2020 10:30 A M Medical Record Number: [id number] Patient Account Number: [id number] Date of Birth/Sex: Treating RN: 12/14/1970 (50 y.o. M) [name] Primary Care Provider: Fleming, [name] Other Clinician: Referring Provider: Treating Provider/Extender: [name] [name] Weeks in Treatment: 7 Visit Information History Since Last Visit Added or deleted any medications: No Patient Arrived: [place] Any new allergies or adverse reactions: No Arrival Time: 10:47 Had a fall or experienced change in No Transfer Assistance: None activities of daily living that may affect Patient Identification Verified: Yes risk of falls: Secondary Verification Process Completed: Yes Signs or symptoms of abuse/neglect since last visito No Patient Requires Transmission-Based Precautions: No Hospitalized since last visit: No Patient Has Alerts: No Implantable device outside of the clinic excluding No cellular tissue based products placed in the center since last visit: Has Dressing in Place as Prescribed: Yes Has Footwear/Offloading in Place as Prescribed: Yes Left: Wedge Shoe Pain Present Now: No Electronic Signature(s) Signed: 10/29/2020 5:27:26 PM By: [name] Entered By: [name] on 10/29/2020 10:50:58 -------------------------------------------------------------------------------- Lower Extremity Assessment Details Patient Name: Date of Service: [name], Joseph RCUS [name]. 10/29/2020 10:30 A M Medical Record Number: [id number] Patient Account Number: [id number] Date of Birth/Sex: Treating RN: 10/12/1970 (50 y.o. M) [name] Primary Care Provider: Fleming, [name] Other Clinician: Referring Provider: Treating Provider/Extender: [name] [name] Weeks in Treatment: 7 Edema Assessment Assessed: [Left: Yes] [Right:  Yes] Edema: [Left: No] [Right: No] Calf Left: Right: Point of Measurement: From Medial Instep 34 cm Ankle Left: Right: Point of Measurement: From Medial Instep 23 cm Vascular Assessment Pulses: Dorsalis Pedis Palpable: [Right:Yes] Electronic Signature(s) Signed: 10/29/2020 5:27:26 PM By: [name] Entered By: [name] on 10/29/2020 10:58:50 -------------------------------------------------------------------------------- Multi-Disciplinary Care Plan Details Patient Name: Date of Service: [name], Joseph RCUS [name]. 10/29/2020 10:30 A M Medical Record Number: [id number] Patient Account Number: [id number] Date of Birth/Sex: Treating RN: 05/08/1970 (50 y.o. M) [name] Primary Care Provider: Fleming, [name] Other Clinician: Referring Provider: Treating Provider/Extender: [name] [name] Weeks in Treatment: 7 Active Inactive Wound/Skin Impairment Nursing Diagnoses: Impaired tissue integrity Goals: Patient/caregiver will verbalize understanding of skin care regimen Date Initiated: 09/05/2020 Target Resolution Date: 11/07/2020 Goal Status: Active Ulcer/skin breakdown will have a volume reduction of 30% by week 4 Date Initiated: 09/05/2020 Date Inactivated: 10/08/2020 Target Resolution Date: 10/03/2020 Goal Status: Met Interventions: Assess patient/caregiver ability to obtain necessary supplies Assess patient/caregiver ability to perform ulcer/skin care regimen upon admission and as needed Assess ulceration(s) every visit Provide education on ulcer and skin care Treatment Activities: Patient referred to home care : 09/05/2020 Skin care regimen initiated : 09/05/2020 Topical wound management initiated : 09/05/2020 Notes: Electronic Signature(s) Signed: 10/29/2020 5:57:23 PM By: Boehlein, Linda RN, BSN Entered By: [name] on 10/29/2020 11:33:31 -------------------------------------------------------------------------------- Pain Assessment  Details Patient Name: Date of Service: [name], Joseph RCUS [name]. 10/29/2020 10:30 A M Medical Record Number: [id number] Patient Account Number: [id number] Date of Birth/Sex: Treating RN: 02/22/1971 (50 y.o. M) [name] Primary Care Provider: Fleming, [name] Other Clinician: Referring Provider: Treating Provider/Extender: [name] [name] Weeks in Treatment: 7 Active Problems Location of Pain Severity and Description of Pain Patient Has Paino No Site Locations Pain Management and Medication Current Pain Management: Electronic Signature(s) Signed: 10/29/2020 5:27:26 PM By: [name] Entered By: [name] on 10/29/2020 10:52:17 -------------------------------------------------------------------------------- Patient/Caregiver Education Details Patient Name:   Date of Service: [name], Joseph RCUS [name]. 8/3/2022andnbsp10:30 A M Medical Record Number: [id number] Patient Account Number: [id number] Date of Birth/Gender: Treating RN: 10/07/1970 (50 y.o. [name] Primary Care Physician: [name] Other Clinician: Referring Physician: Treating Physician/Extender: Noni Saupe in Treatment: 7 Education Assessment Education Provided To: Patient Education Topics Provided Offloading: Methods: Explain/Verbal Responses: Reinforcements needed, State content correctly Wound/Skin Impairment: Methods: Explain/Verbal Responses: Reinforcements needed, State content correctly Electronic Signature(s) Signed: 10/29/2020 5:57:23 PM By: Joseph Gouty RN, BSN Entered By: [name] on 10/29/2020 11:36:59 -------------------------------------------------------------------------------- Wound Assessment Details Patient Name: Date of Service: [name], Joseph RCUS [name]. 10/29/2020 10:30 A M Medical Record Number: [id number] Patient Account Number: [id number] Date of Birth/Sex: Treating RN: [birth year] (50 y.o. 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 Secondary Open Surgical Wound Wounding Event: Surgical Injury Etiology: Date Acquired: 02/28/2020 Wound Open Weeks Of Treatment: 7 Status: Clustered Wound: No Comorbid Anemia, Coronary Artery Disease, Hypertension, Type II History: Diabetes, Osteomyelitis, Neuropathy Photos Wound Measurements Length: (cm) 0.4 Width: (cm) 0.3 Depth: (cm) 0.7 Area: (cm) 0.094 Volume: (cm) 0.066 % Reduction in Area: 92.2% % Reduction in Volume: 93.2% Epithelialization: Medium (34-66%) Tunneling: No Undermining: No Wound Description Classification: Grade 3 Wound Margin: Distinct, outline attached Exudate Amount: Medium Exudate Type: Serosanguineous Exudate Color: red, brown Foul Odor After Cleansing: No Slough/Fibrino No Wound Bed Granulation Amount: Large (67-100%) Exposed Structure Granulation Quality: Pink, Pale Fascia Exposed: No Necrotic Amount: None Present (0%) Fat Layer (Subcutaneous Tissue) Exposed: Yes Tendon Exposed: No Muscle Exposed: No Joint Exposed: No Bone Exposed: No Electronic Signature(s) Signed: 10/29/2020 5:27:26 PM By: Lorrin Jackson Entered By: Lorrin Jackson on 10/29/2020 11:02:59 -------------------------------------------------------------------------------- Wound Assessment Details Patient Name: Date of Service: [name], Joseph RCUS [name]. 10/29/2020 10:30 A M Medical Record Number: [id number] Patient Account Number: [id number] Date of Birth/Sex: Treating RN: [birth year] (50 y.o. [name] Primary Care Taiyo Kozma: [name] Other Clinician: Referring Janalee Grobe: Treating Lety Cullens/Extender: [name], [name] Weeks in Treatment: 7 Wound Status Wound Number: 2  Primary Diabetic Wound/Ulcer of the Lower Extremity Etiology: Wound Location: Left, Plantar Foot Wound Open Wounding Event: Gradually Appeared Status: Date Acquired: 10/15/2020 Comorbid Anemia, Coronary Artery Disease, Hypertension, Type II Weeks Of Treatment: 1 History: Diabetes, Osteomyelitis, Neuropathy Clustered Wound: No Photos Wound Measurements Length: (cm) 0.3 Width: (cm) 1 Depth: (cm) 0.1 Area: (cm) 0.236 Volume: (cm) 0.024 % Reduction in Area: 98.1% % Reduction in Volume: 99% Epithelialization: None Tunneling: No Undermining: No Wound Description Classification: Grade 1 Wound Margin: Distinct, outline attached Exudate Amount: Medium Exudate Type: Serosanguineous Exudate Color: red, brown Foul Odor After Cleansing: No Slough/Fibrino No Wound Bed Granulation Amount: Large (67-100%) Exposed Structure Granulation Quality: Pink, Pale Fascia Exposed: No Necrotic Amount: None Present (0%) Fat Layer (Subcutaneous Tissue) Exposed: Yes Tendon Exposed: No Muscle Exposed: No Joint Exposed: No Bone Exposed: No Electronic Signature(s) Signed: 10/29/2020 5:27:26 PM By: Lorrin Jackson Entered By: Lorrin Jackson on 10/29/2020 11:04:30 -------------------------------------------------------------------------------- Wound Assessment Details Patient Name: Date of Service: [name], Joseph RCUS [name]. 10/29/2020 10:30 A M Medical Record Number: [id number] Patient Account Number: [id number] Date of Birth/Sex: Treating RN: 06-Jan-1971 (49 y.o. [name] Primary Care Ersilia Brawley: [name] Other Clinician: Referring Tanayia Wahlquist: Treating Mosiah Bastin/Extender: [name], [name] Weeks in Treatment:  7 Wound Status Wound Number: 3 Primary Diabetic Wound/Ulcer of the Lower Extremity Etiology: Wound Location: Left, Lateral Foot Wound Open Wounding Event: Gradually Appeared Status: Date Acquired: 10/29/2020 Comorbid Anemia, Coronary Artery Disease, Hypertension, Type  II Weeks Of Treatment: 0 History: Diabetes, Osteomyelitis, Neuropathy Clustered Wound: No Wound Measurements Length: (cm) 0.4 Width: (cm) 0.4 Depth: (cm) 0.7 Area: (cm) 0.126 Volume: (cm) 0.088 % Reduction in Area: % Reduction in Volume: Epithelialization: None Tunneling: No Undermining: Yes Starting Position (o'clock): 9 Ending Position (o'clock): 11 Maximum Distance: (cm) 2 Wound Description Classification: Grade 2 Wound Margin: Well defined, not attached Exudate Amount: Medium Exudate Type: Serosanguineous Exudate Color: red, brown Foul Odor After Cleansing: No Slough/Fibrino No Wound Bed Granulation Amount: Large (67-100%) Exposed Structure Granulation Quality: Red Fascia Exposed: No Necrotic Amount: None Present (0%) Fat Layer (Subcutaneous Tissue) Exposed: Yes Tendon Exposed: No Muscle Exposed: No Joint Exposed: No Bone Exposed: No Electronic Signature(s) Signed: 10/29/2020 5:57:23 PM By: Boehlein, Linda RN, BSN Entered By: [name] on 10/29/2020 12:01:19 -------------------------------------------------------------------------------- Wound Assessment Details Patient Name: Date of Service: [name], Joseph RCUS [name]. 10/29/2020 10:30 A M Medical Record Number: [id number] Patient Account Number: [id number] Date of Birth/Sex: Treating RN: 02/12/1971 (50 y.o. M) [name] Primary Care Provider: Fleming, [name] Other Clinician: Referring Provider: Treating Provider/Extender: [name] [name] Weeks in Treatment: 7 Wound Status Wound Number: 4 Primary Diabetic Wound/Ulcer of the Lower Extremity Etiology: Wound Location: Right, Distal, Lateral Foot Wound Open Wounding Event: Gradually Appeared Status: Date Acquired: 10/29/2020 Comorbid Anemia, Coronary Artery Disease, Hypertension, Type II Weeks Of Treatment: 0 History: Diabetes, Osteomyelitis, Neuropathy Clustered Wound: No Wound Measurements Length: (cm) 0.3 Width: (cm) 0.3 Depth:  (cm) 0.5 Area: (cm) 0.071 Volume: (cm) 0.035 Wound Description Classification: Grade 2 Wound Margin: Thickened Exudate Amount: Medium Exudate Type: Serosanguineous Exudate Color: red, brown Foul Odor After Cleansing: Slough/Fibrino % Reduction in Area: % Reduction in Volume: Epithelialization: None Tunneling: No Undermining: No No No Wound Bed Granulation Amount: Large (67-100%) Exposed Structure Granulation Quality: Red Fascia Exposed: No Necrotic Amount: None Present (0%) Fat Layer (Subcutaneous Tissue) Exposed: Yes Tendon Exposed: No Muscle Exposed: No Joint Exposed: No Bone Exposed: No Electronic Signature(s) Signed: 10/29/2020 5:57:23 PM By: Boehlein, Linda RN, BSN Entered By: [name] on 10/29/2020 12:07:31 -------------------------------------------------------------------------------- Vitals Details Patient Name: Date of Service: [name], Joseph RCUS [name]. 10/29/2020 10:30 A M Medical Record Number: [id number] Patient Account Number: [id number] Date of Birth/Sex: Treating RN: 06/22/1970 (50 y.o. M) [name] Primary Care Provider: Fleming, [name] Other Clinician: Referring Provider: Treating Provider/Extender: [name] [name] Weeks in Treatment: 7 Vital Signs Time Taken: 10:51 Temperature (°F): 98.2 Height (in): 64 Pulse (bpm): 97 Weight (lbs): 188 Respiratory Rate (breaths/min): 18 Body Mass Index (BMI): 32.3 Blood Pressure (mmHg): 132/82 Capillary Blood Glucose (mg/dl): 130 Reference Range: 80 - 120 mg / dl Electronic Signature(s) Signed: 10/29/2020 5:27:26 PM By: [name] Entered By: [name] on 10/29/2020 10:51:40 

## 2020-10-29 NOTE — Progress Notes (Addendum)
Joseph, [name] ([id number]) Visit Report for 10/29/2020 Chief Complaint Document Details Patient Name: Date of Service: [place] RD, [name] 10/29/2020 10:30 A M Medical Record Number: [id number] Patient Account Number: [id number] Date of Birth/Sex: Treating RN: 25-Dec-1970 (50 y.o. [name] Primary Care Provider: [name] Other Clinician: Referring Provider: Treating Provider/Extender: Dorris Singh, Army Melia Weeks in Treatment: 7 Information Obtained from: Patient Chief Complaint 09/05/2020; patient is here for review of the wound extensively on a right TMA amputation site Electronic Signature(s) Signed: 10/29/2020 11:30:38 AM By: Worthy Keeler PA-C Entered By: Worthy Keeler on 10/29/2020 11:30:38 -------------------------------------------------------------------------------- Debridement Details Patient Name: Date of Service: [name], [place] 10/29/2020 10:30 A M Medical Record Number: [id number] Patient Account Number: [id number] Date of Birth/Sex: Treating RN: [birth year] (50 y.o. [name] Primary Care Provider: [name] Other Clinician: Referring Provider: Treating Provider/Extender: Lenox Ponds Weeks in Treatment: 7 Debridement Performed for Assessment: Wound #2 Left,Plantar Foot Performed By: Physician Worthy Keeler, PA Debridement Type: Debridement Severity of Tissue Pre Debridement: Fat layer exposed Level of Consciousness (Pre-procedure): Awake and Alert Pre-procedure Verification/Time Out Yes - 11:30 Taken: Start Time: 11:33 Pain Control: Other : banzocaine 20% spray T Area Debrided (L x W): otal 5.5 (cm) x 5 (cm) = 27.5 (cm) Tissue and other material debrided: Non-Viable, Callus, Skin: Epidermis Level: Skin/Epidermis Debridement Description: Selective/Open Wound Instrument: Curette Bleeding: Minimum Hemostasis Achieved: Pressure End Time: 11:46 Procedural Pain: 0 Post Procedural Pain: 0 Response  to Treatment: Procedure was tolerated well Level of Consciousness (Post- Awake and Alert procedure): Post Debridement Measurements of Total Wound Length: (cm) 0.1 Width: (cm) 0.1 Depth: (cm) 0.1 Volume: (cm) 0.001 Character of Wound/Ulcer Post Debridement: Improved Severity of Tissue Post Debridement: Limited to breakdown of skin Post Procedure Diagnosis Same as Pre-procedure Electronic Signature(s) Signed: 10/29/2020 5:22:07 PM By: Worthy Keeler PA-C Signed: 10/29/2020 5:57:23 PM By: Baruch Gouty RN, BSN Entered By: Baruch Gouty on 10/29/2020 11:45:34 -------------------------------------------------------------------------------- Debridement Details Patient Name: Date of Service: [name] RD, [place] 10/29/2020 10:30 A M Medical Record Number: [id number] Patient Account Number: [id number] Date of Birth/Sex: Treating RN: Apr 20, 1970 (50 y.o. [name] Primary Care Provider: [name] Other Clinician: Referring Provider: Treating Provider/Extender: Lenox Ponds Weeks in Treatment: 7 Debridement Performed for Assessment: Wound #3 Right,Proximal,Lateral Foot Performed By: Physician Worthy Keeler, PA Debridement Type: Debridement Severity of Tissue Pre Debridement: Fat layer exposed Level of Consciousness (Pre-procedure): Awake and Alert Pre-procedure Verification/Time Out Yes - 11:30 Taken: Start Time: 11:33 Pain Control: Other : banzocaine 20% spray T Area Debrided (L x W): otal 4 (cm) x 4 (cm) = 16 (cm) Tissue and other material debrided: Non-Viable, Callus, Skin: Epidermis Level: Skin/Epidermis Debridement Description: Selective/Open Wound Instrument: Curette, Forceps, Scissors Bleeding: Minimum Hemostasis Achieved: Pressure End Time: 11:46 Procedural Pain: 0 Post Procedural Pain: 0 Response to Treatment: Procedure was tolerated well Level of Consciousness (Post- Awake and Alert procedure): Post Debridement Measurements of  Total Wound Length: (cm) 0.4 Width: (cm) 0.4 Depth: (cm) 0.7 Volume: (cm) 0.088 Character of Wound/Ulcer Post Debridement: Improved Severity of Tissue Post Debridement: Fat layer exposed Post Procedure Diagnosis Same as Pre-procedure Electronic Signature(s) Signed: 10/29/2020 5:22:07 PM By: Worthy Keeler PA-C Signed: 10/29/2020 5:57:23 PM By: Baruch Gouty RN, BSN Entered By: Baruch Gouty on 10/29/2020 13:10:14 -------------------------------------------------------------------------------- Debridement Details Patient Name: Date of Service: [name] RD, [name] 10/29/2020 10:30 A M Medical Record Number: [id number] Patient  Account Number: [id number] Date of Birth/Sex: Treating RN: September 28, 1970 (50 y.o. [name] Primary Care Provider: [name] Other Clinician: Referring Provider: Treating Provider/Extender: Lenox Ponds Weeks in Treatment: 7 Debridement Performed for Assessment: Wound #4 Right,Distal,Lateral Foot Performed By: Physician Worthy Keeler, PA Debridement Type: Debridement Severity of Tissue Pre Debridement: Fat layer exposed Level of Consciousness (Pre-procedure): Awake and Alert Pre-procedure Verification/Time Out Yes - 11:30 Taken: Start Time: 11:33 Pain Control: Other : banzocaine 20% spray T Area Debrided (L x W): otal 1 (cm) x 1 (cm) = 1 (cm) Tissue and other material debrided: Non-Viable, Callus, Skin: Epidermis Level: Skin/Epidermis Debridement Description: Selective/Open Wound Instrument: Curette Bleeding: Minimum Hemostasis Achieved: Pressure End Time: 11:46 Procedural Pain: 0 Post Procedural Pain: 0 Response to Treatment: Procedure was tolerated well Level of Consciousness (Post- Awake and Alert procedure): Post Debridement Measurements of Total Wound Length: (cm) 0.3 Width: (cm) 0.3 Depth: (cm) 0.5 Volume: (cm) 0.035 Character of Wound/Ulcer Post Debridement: Improved Severity of Tissue Post  Debridement: Fat layer exposed Post Procedure Diagnosis Same as Pre-procedure Electronic Signature(s) Signed: 10/29/2020 5:22:07 PM By: Worthy Keeler PA-C Signed: 10/29/2020 5:57:23 PM By: Baruch Gouty RN, BSN Entered By: Baruch Gouty on 10/29/2020 13:11:02 -------------------------------------------------------------------------------- Debridement Details Patient Name: Date of Service: [name] RD, [place] 10/29/2020 10:30 A M Medical Record Number: [id number] Patient Account Number: [id number] Date of Birth/Sex: Treating RN: 16-Jun-1970 (50 y.o. [name] Primary Care Provider: [name] Other Clinician: Referring Provider: Treating Provider/Extender: Lenox Ponds Weeks in Treatment: 7 Debridement Performed for Assessment: Wound #1 Right Amputation Site - Transmetatarsal Performed By: Physician Worthy Keeler, PA Debridement Type: Debridement Severity of Tissue Pre Debridement: Fat layer exposed Level of Consciousness (Pre-procedure): Awake and Alert Pre-procedure Verification/Time Out Yes - 11:30 Taken: Start Time: 11:33 Pain Control: Other : banzocaine 20% spray T Area Debrided (L x W): otal 1 (cm) x 2 (cm) = 2 (cm) Tissue and other material debrided: Non-Viable, Callus, Slough, Subcutaneous, Skin: Epidermis, Slough Level: Skin/Subcutaneous Tissue Debridement Description: Excisional Instrument: Curette Bleeding: Minimum Hemostasis Achieved: Pressure End Time: 11:46 Procedural Pain: 0 Post Procedural Pain: 0 Response to Treatment: Procedure was tolerated well Level of Consciousness (Post- Awake and Alert procedure): Post Debridement Measurements of Total Wound Length: (cm) 0.4 Width: (cm) 0.3 Depth: (cm) 0.7 Volume: (cm) 0.066 Character of Wound/Ulcer Post Debridement: Improved Severity of Tissue Post Debridement: Fat layer exposed Post Procedure Diagnosis Same as Pre-procedure Electronic Signature(s) Signed: 10/29/2020  5:22:07 PM By: Worthy Keeler PA-C Signed: 10/29/2020 5:57:23 PM By: Baruch Gouty RN, BSN Entered By: Baruch Gouty on 10/29/2020 13:12:49 -------------------------------------------------------------------------------- HPI Details Patient Name: Date of Service: [name] RD, [place] 10/29/2020 10:30 A M Medical Record Number: [id number] Patient Account Number: [id number] Date of Birth/Sex: Treating RN: 1970-10-14 (51 y.o. [name] Primary Care Provider: [name] Other Clinician: Referring Provider: Treating Provider/Extender: Lenox Ponds Weeks in Treatment: 7 History of Present Illness HPI Description: ADMISSION 09/05/2020 This is a 50 year old man who has type 2 diabetes. 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 Cultures ultimately grew staph lugdunensis. 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 March Rummage. Shortly thereafter he developed a nonhealing wound with wound dehiscence noted on 05/09/2020. He had a wound VAC I think for a period of time after the surgery but it did not help. He has undergone an IandD and skin graft on  the right foot on 05/16/2020. He underwent a further IandD on 06/04/2020 not debrided to bone. 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 Culture of the metatarsal grew Enterobacter cloacae I. He has been followed by Dr. [name] of infectious disease. Currently on Levaquin and doxycycline I think at about the 4-week of 6 mark. At the last visit with Dr. March Rummage he was supposed to have use Santyl although I do not think he has it and he has been using I think a Betadine wet-to-dry. He is referred here by Dr. [name] for our review who saw him yesterday. I cannot see that he has had imaging studies of the right foot. No MRI Past medical history includes type 2 diabetes, in 2021 and  ulcer of the left foot that ultimately healed with a wound VAC, stage III chronic kidney disease, first toe amputations bilaterally, hypertension, still a 1/2 pack/day smoker. The patient had ABIs on 08/09/2018 which was 1.00 on the right and 0.96 on the left both areas had triphasic waveforms. Not felt to have an arterial issue. 09/12/2020; 1 week follow-up. He has been doing the dressings along with home health. This is a deep punched out area in the incision of his transmit site. We use silver alginate last week to help with drainage and antibacterial properties. He tolerated this well 6/24; the patient was actually at his podiatrist this morning who debrided the wound. We have been using silver alginate. They are going to follow him in 6 weeks. 6/29; patient is using silver alginate. Apparently saw Dr. March Rummage this week although I have not checked his note I will try to do so. He is following up in 6 weeks. This was the original transmetatarsal amputation when I first saw this 3 mid part of his amputation site was deep down to bone however this seems to have progressively close down which is gratifying to see. He still has a fairly callus uneven surface however spreading this apart its difficult to identify anything that does not look epithelialized. When I first saw this I thought he would require an extensive debridement perhaps back in the OR by Dr. March Rummage however all of this appears to be looking better and at this point I would continue with the silver alginate see if this holds together over the next several weeks. He does not have an arterial issue 7/13; 2-week follow-up. Unfortunately the area did not hold together there was drainage on his dressing. Still copious amounts of callus. It was difficult to see where this actually was open therefore I elected to go ahead with a vigorous debridement. Still using silver alginate 7/20 I brought him back after extensive callus debridement last week. The  only open area was on the medial part of the foot extensive debridement of thick surrounding callus and I was able to do identify the remaining tunneling area. 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 This is the area that we may have been following. This is smaller this week but still surrounded with thick callus He came in today with a large area of callus on the left midfoot. This I had noticed previously. HOWEVER our intake nurse clearly incorrectly documented this as opening. The callus and split there was an odor so all of this had to be removed. 10/29/2020 unfortunately upon evaluation today this patient appears to have significant  mount of callus noted at this point. There does not appear to be any signs of active infection systemically which is great although locally he is having some discomfort around the lateral portion of his foot on the right. The left foot is no having a lot of callus I am not certain even has an open wound if there is anything is extremely tiny. Electronic Signature(s) Signed: 10/29/2020 1:34:30 PM By: Worthy Keeler PA-C Entered By: Worthy Keeler on 10/29/2020 13:34:29 -------------------------------------------------------------------------------- Physical Exam Details Patient Name: Date of Service: [name], [name] 10/29/2020 10:30 A M Medical Record Number: [id number] Patient Account Number: [id number] Date of Birth/Sex: Treating RN: 04-13-1970 (50 y.o. [name] Primary Care Provider: [name] Other Clinician: Referring Provider: Treating Provider/Extender: Dorris Singh, Zelda Weeks in Treatment: 7 Constitutional Well-nourished and well-hydrated in no acute distress. Respiratory normal breathing without difficulty. Psychiatric this patient is able to make decisions and demonstrates good insight into disease process. Alert and Oriented x 3. pleasant and  cooperative. Notes Upon inspection as I continue to debride there actually did appear to be a couple of areas that opened up as far as regions of ulceration that were noted underneath the callus and were not completely obvious and apparent just upon initial inspection. In fact there were 3 spots on the plantar aspect of the foot that we had to count separately that were all open wounds and did not directly connect. Some of these may just be as a result of the extensive callus and some of the abnormal rubbing that hopefully will heal quite readily. Unfortunately there is one on the plantar aspect of the foot that is a small wound opening but unfortunately has a lot of undermining. With this reason I really think we need to check into an MRI to further evaluate and see where things stand here. I think that there is at least a chance of underlying osteomyelitis has had this before although it could be that this is just a deeper wound without obvious signs of osteo which would be the ideal thing. Either way I do believe that the patient needs to have a MRI to further evaluate this. Electronic Signature(s) Signed: 10/29/2020 1:35:46 PM By: Worthy Keeler PA-C Entered By: Worthy Keeler on 10/29/2020 13:35:46 -------------------------------------------------------------------------------- Physician Orders Details Patient Name: Date of Service: [name], [place] 10/29/2020 10:30 A M Medical Record Number: [id number] Patient Account Number: [id number] Date of Birth/Sex: Treating RN: April 05, 1970 (50 y.o. [name] Primary Care Provider: [name] Other Clinician: Referring Provider: Treating Provider/Extender: Lenox Ponds Weeks in Treatment: 7 Verbal / Phone Orders: No Diagnosis Coding ICD-10 Coding Code Description E11.621 Type 2 diabetes mellitus with foot ulcer L97.514 Non-pressure chronic ulcer of other part of right foot with necrosis of bone T81.31XS  Disruption of external operation (surgical) wound, not elsewhere classified, sequela L97.528 Non-pressure chronic ulcer of other part of left foot with other specified severity Follow-up Appointments ppointment in 1 week. - with Margarita Grizzle Return A Bathing/ Shower/ Hygiene May shower and wash wound with soap and water. - on days you change dressing Edema Control - Lymphedema / SCD / Other Avoid standing for long periods of time. Exercise regularly Off-Loading Open toe surgical shoe to: - Wear at all times except driving. on right foot Other: - may wear shoe with memory foam insert to left foot Additional Orders / Instructions Follow Nutritious Diet Non Wound Condition Other Non Wound Condition  Orders/Instructions: - cushion bottom of left foot with orthopedic felt or foam Home Health No change in wound care orders this week; continue Home Health for wound care. May utilize formulary equivalent dressing for wound treatment orders unless otherwise specified. Other Home Health Orders/Instructions: - [facility] Wound Treatment Wound #1 - Amputation Site - Transmetatarsal Wound Laterality: Right Cleanser: Soap and Water [facility]) Every Other Day/30 Days Discharge Instructions: May shower and wash wound with dial antibacterial soap and water prior to dressing change. Cleanser: Wound Cleanser Every Other Day/30 Days Discharge Instructions: Cleanse the wound with wound cleanser prior to applying a clean dressing using gauze sponges, not tissue or cotton balls. Prim Dressing: KerraCel Ag Gelling Fiber Dressing, 4x5 in (silver alginate) (Home Health) Every Other Day/30 Days ary Discharge Instructions: Pack into undermined area Secondary Dressing: Woven Gauze Sponge, Non-Sterile 4x4 in [facility]) Every Other Day/30 Days Discharge Instructions: Apply over primary dressing as directed. Secondary Dressing: ABD Pad, 5x9 [facility]) Every Other Day/30 Days Discharge Instructions: Apply  over primary dressing as directed. Secured With: Coban Self-Adherent Wrap 4x5 (in/yd) Every Other Day/30 Days Discharge Instructions: Secure with Coban as directed. Secured With: [employer], 4.5x3.1 (in/yd) [facility]) Every Other Day/30 Days Discharge Instructions: Secure with Kerlix as directed. Secured With: Transpore Surgical Tape, 2x10 (in/yd) [facility]) Every Other Day/30 Days Discharge Instructions: Secure dressing with tape as directed. Wound #2 - Foot Wound Laterality: Plantar, Left Cleanser: Soap and Water [facility]) Every Other Day/30 Days Discharge Instructions: May shower and wash wound with dial antibacterial soap and water prior to dressing change. Cleanser: Wound Cleanser Every Other Day/30 Days Discharge Instructions: Cleanse the wound with wound cleanser prior to applying a clean dressing using gauze sponges, not tissue or cotton balls. Prim Dressing: KerraCel Ag Gelling Fiber Dressing, 4x5 in (silver alginate) (Home Health) Every Other Day/30 Days ary Discharge Instructions: Pack into undermined area Secondary Dressing: Woven Gauze Sponge, Non-Sterile 4x4 in [facility]) Every Other Day/30 Days Discharge Instructions: Apply over primary dressing as directed. Secondary Dressing: ABD Pad, 5x9 [facility]) Every Other Day/30 Days Discharge Instructions: Apply over primary dressing as directed. Secured With: Coban Self-Adherent Wrap 4x5 (in/yd) Every Other Day/30 Days Discharge Instructions: Secure with Coban as directed. Secured With: [employer], 4.5x3.1 (in/yd) [facility]) Every Other Day/30 Days Discharge Instructions: Secure with Kerlix as directed. Secured With: Transpore Surgical Tape, 2x10 (in/yd) [facility]) Every Other Day/30 Days Discharge Instructions: Secure dressing with tape as directed. Wound #3 - Foot Wound Laterality: Left, Lateral Cleanser: Soap and Water [facility]) Every Other Day/30 Days Discharge Instructions: May  shower and wash wound with dial antibacterial soap and water prior to dressing change. Cleanser: Wound Cleanser Every Other Day/30 Days Discharge Instructions: Cleanse the wound with wound cleanser prior to applying a clean dressing using gauze sponges, not tissue or cotton balls. Prim Dressing: KerraCel Ag Gelling Fiber Dressing, 4x5 in (silver alginate) (Home Health) Every Other Day/30 Days ary Discharge Instructions: Pack into undermined area Secondary Dressing: Woven Gauze Sponge, Non-Sterile 4x4 in [facility]) Every Other Day/30 Days Discharge Instructions: Apply over primary dressing as directed. Secondary Dressing: ABD Pad, 5x9 [facility]) Every Other Day/30 Days Discharge Instructions: Apply over primary dressing as directed. Secured With: Coban Self-Adherent Wrap 4x5 (in/yd) Every Other Day/30 Days Discharge Instructions: Secure with Coban as directed. Secured With: [employer], 4.5x3.1 (in/yd) [facility]) Every Other Day/30 Days Discharge Instructions: Secure with Kerlix as directed. Secured With: [employer]  Surgical Tape, 2x10 (in/yd) [facility]) Every Other Day/30 Days Discharge Instructions: Secure dressing with tape as directed. Wound #4 - Foot Wound Laterality: Right, Lateral, Distal Cleanser: Soap and Water [facility]) Every Other Day/30 Days Discharge Instructions: May shower and wash wound with dial antibacterial soap and water prior to dressing change. Cleanser: Wound Cleanser Every Other Day/30 Days Discharge Instructions: Cleanse the wound with wound cleanser prior to applying a clean dressing using gauze sponges, not tissue or cotton balls. Prim Dressing: KerraCel Ag Gelling Fiber Dressing, 4x5 in (silver alginate) (Home Health) Every Other Day/30 Days ary Discharge Instructions: Pack into undermined area Secondary Dressing: Woven Gauze Sponge, Non-Sterile 4x4 in [facility]) Every Other Day/30 Days Discharge Instructions: Apply over primary dressing  as directed. Secondary Dressing: ABD Pad, 5x9 [facility]) Every Other Day/30 Days Discharge Instructions: Apply over primary dressing as directed. Secured With: Coban Self-Adherent Wrap 4x5 (in/yd) Every Other Day/30 Days Discharge Instructions: Secure with Coban as directed. Secured With: [employer], 4.5x3.1 (in/yd) [facility]) Every Other Day/30 Days Discharge Instructions: Secure with Kerlix as directed. Secured With: Transpore Surgical Tape, 2x10 (in/yd) [facility]) Every Other Day/30 Days Discharge Instructions: Secure dressing with tape as directed. Radiology MRI, lower extremity with/without contrast right foot - diabetic foot ulcer right foot - (ICD10 L97.514 - Non-pressure chronic ulcer of other part of right foot with necrosis of bone) Electronic Signature(s) Signed: 10/29/2020 5:22:07 PM By: Worthy Keeler PA-C Signed: 10/29/2020 5:57:23 PM By: Baruch Gouty RN, BSN Entered By: Baruch Gouty on 10/29/2020 12:08:32 Prescription 10/29/2020 -------------------------------------------------------------------------------- Denton Brick PA Patient Name: Provider: Sep 02, 1970 [id number] Date of Birth: NPI#: Jerilynn Mages [id number] Sex: DEA #: [phone] Phone #: License #: [place] Patient Address: [place] [address] [place], [place] [address] [place], [place] [address] [phone] Allergies penicillin; bee venom protein (honey bee) Provider's Orders MRI, lower extremity with/without contrast right foot - ICD10: L97.514 - diabetic foot ulcer right foot Hand Signature: Date(s): Electronic Signature(s) Signed: 10/29/2020 5:22:07 PM By: Worthy Keeler PA-C Signed: 10/29/2020 5:57:23 PM By: Baruch Gouty RN, BSN Entered By: Baruch Gouty on 10/29/2020 12:08:32 -------------------------------------------------------------------------------- Problem List Details Patient Name: Date  of Service: [name] RD, [place] 10/29/2020 10:30 A M Medical Record Number: [id number] Patient Account Number: [id number] Date of Birth/Sex: Treating RN: 13-Sep-1970 (50 y.o. [name] Primary Care Provider: [name] Other Clinician: Referring Provider: Treating Provider/Extender: Lenox Ponds Weeks in Treatment: 7 Active Problems ICD-10 Encounter Code Description Active Date MDM Diagnosis E11.621 Type 2 diabetes mellitus with foot ulcer 09/05/2020 No Yes L97.514 Non-pressure chronic ulcer of other part of right foot with necrosis of bone 09/05/2020 No Yes T81.31XS Disruption of external operation (surgical) wound, not elsewhere classified, 09/05/2020 No Yes sequela L97.528 Non-pressure chronic ulcer of other part of left foot with other specified 10/22/2020 No Yes severity Inactive Problems ICD-10 Code Description Active Date Inactive Date M86.671 Other chronic osteomyelitis, right ankle and foot 09/05/2020 09/05/2020 Resolved Problems Electronic Signature(s) Signed: 10/29/2020 11:30:24 AM By: Worthy Keeler PA-C Entered By: Worthy Keeler on 10/29/2020 11:30:23 -------------------------------------------------------------------------------- Progress Note Details Patient Name: Date of Service: [name], [name] 10/29/2020 10:30 A M Medical Record Number: [id number] Patient Account Number: [id number] Date of Birth/Sex: Treating RN: 1970/05/22 (50 y.o. [name] Primary Care Provider: [name] Other Clinician: Referring Provider: Treating Provider/Extender: Dorris Singh, Zelda Weeks in  Treatment: 7 Subjective Chief Complaint Information obtained from Patient 09/05/2020; patient is here for review of the wound extensively on a right TMA amputation site History of Present Illness (HPI) ADMISSION 09/05/2020 This is a 50 year old man who has type 2 diabetes. 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 Cultures ultimately grew staph lugdunensis. 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 March Rummage. Shortly thereafter he developed a nonhealing wound with wound dehiscence noted on 05/09/2020. He had a wound VAC I think for a period of time after the surgery but it did not help. He has undergone an IandD and skin graft on the right foot on 05/16/2020. He underwent a further IandD on 06/04/2020 not debrided to bone. 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 Culture of the metatarsal grew Enterobacter cloacae I. He has been followed by Dr. [name] of infectious disease. Currently on Levaquin and doxycycline I think at about the 4-week of 6 mark. At the last visit with Dr. March Rummage he was supposed to have use Santyl although I do not think he has it and he has been using I think a Betadine wet-to-dry. He is referred here by Dr. [name] for our review who saw him yesterday. I cannot see that he has had imaging studies of the right foot. No MRI Past medical history includes type 2 diabetes, in 2021 and ulcer of the left foot that ultimately healed with a wound VAC, stage III chronic kidney disease, first toe amputations bilaterally, hypertension, still a 1/2 pack/day smoker. The patient had ABIs on 08/09/2018 which was 1.00 on the right and 0.96 on the left both areas had triphasic waveforms. Not felt to have an arterial issue. 09/12/2020; 1 week follow-up. He has been doing the dressings along with home health. This is a deep punched out area in the incision of his transmit site. We use silver alginate last week to help with drainage and antibacterial properties. He tolerated this well 6/24; the patient was actually at his podiatrist this morning who debrided the wound. We have been using silver alginate. They are going to follow him in 6 weeks. 6/29; patient is using silver  alginate. Apparently saw Dr. March Rummage this week although I have not checked his note I will try to do so. He is following up in 6 weeks. This was the original transmetatarsal amputation when I first saw this 3 mid part of his amputation site was deep down to bone however this seems to have progressively close down which is gratifying to see. He still has a fairly callus uneven surface however spreading this apart its difficult to identify anything that does not look epithelialized. When I first saw this I thought he would require an extensive debridement perhaps back in the OR by Dr. March Rummage however all of this appears to be looking better and at this point I would continue with the silver alginate see if this holds together over the next several weeks. He does not have an arterial issue 7/13; 2-week follow-up. Unfortunately the area did not hold together there was drainage on his dressing. Still copious amounts of callus. It was difficult to see where this actually was open therefore I elected to go ahead with a vigorous debridement. Still using silver alginate 7/20 I brought him back after extensive callus debridement last week. The only open area was on the medial part of  the foot extensive debridement of thick surrounding callus and I was able to do identify the remaining tunneling area. 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 This is the area that we may have been following. This is smaller this week but still surrounded with thick callus He came in today with a large area of callus on the left midfoot. This I had noticed previously. HOWEVER our intake nurse clearly incorrectly documented this as opening. The callus and split there was an odor so all of this had to be removed. 10/29/2020 unfortunately upon evaluation today this patient appears to have significant mount of callus noted at this point. There does not appear to be any signs  of active infection systemically which is great although locally he is having some discomfort around the lateral portion of his foot on the right. The left foot is no having a lot of callus I am not certain even has an open wound if there is anything is extremely tiny. Objective Constitutional Well-nourished and well-hydrated in no acute distress. Vitals Time Taken: 10:51 AM, Height: 64 in, Weight: 188 lbs, BMI: 32.3, Temperature: 98.2 F, Pulse: 97 bpm, Respiratory Rate: 18 breaths/min, Blood Pressure: 132/82 mmHg, Capillary Blood Glucose: 130 mg/dl. Respiratory normal breathing without difficulty. Psychiatric this patient is able to make decisions and demonstrates good insight into disease process. Alert and Oriented x 3. pleasant and cooperative. General Notes: Upon inspection as I continue to debride there actually did appear to be a couple of areas that opened up as far as regions of ulceration that were noted underneath the callus and were not completely obvious and apparent just upon initial inspection. In fact there were 3 spots on the plantar aspect of the foot that we had to count separately that were all open wounds and did not directly connect. Some of these may just be as a result of the extensive callus and some of the abnormal rubbing that hopefully will heal quite readily. Unfortunately there is one on the plantar aspect of the foot that is a small wound opening but unfortunately has a lot of undermining. With this reason I really think we need to check into an MRI to further evaluate and see where things stand here. I think that there is at least a chance of underlying osteomyelitis has had this before although it could be that this is just a deeper wound without obvious signs of osteo which would be the ideal thing. Either way I do believe that the patient needs to have a MRI to further evaluate this. Integumentary (Hair, Skin) Wound #1 status is Open. Original cause of wound  was Surgical Injury. The date acquired was: 02/28/2020. The wound has been in treatment 7 weeks. The wound is located on the Right Amputation Site - Transmetatarsal. The wound measures 0.4cm length x 0.3cm width x 0.7cm depth; 0.094cm^2 area and 0.066cm^3 volume. There is Fat Layer (Subcutaneous Tissue) exposed. There is no tunneling or undermining noted. There is a medium amount of serosanguineous drainage noted. The wound margin is distinct with the outline attached to the wound base. There is large (67-100%) pink, pale granulation within the wound bed. There is no necrotic tissue within the wound bed. Wound #2 status is Open. Original cause of wound was Gradually Appeared. The date acquired was: 10/15/2020. The wound has been in treatment 1 weeks. The wound is located on the Bath. The wound measures 0.3cm  length x 1cm width x 0.1cm depth; 0.236cm^2 area and 0.024cm^3 volume. There is Fat Layer (Subcutaneous Tissue) exposed. There is no tunneling or undermining noted. There is a medium amount of serosanguineous drainage noted. The wound margin is distinct with the outline attached to the wound base. There is large (67-100%) pink, pale granulation within the wound bed. There is no necrotic tissue within the wound bed. Wound #3 status is Open. Original cause of wound was Gradually Appeared. The date acquired was: 10/29/2020. The wound is located on the Right,Proximal,Lateral Foot. The wound measures 0.4cm length x 0.4cm width x 0.7cm depth; 0.126cm^2 area and 0.088cm^3 volume. There is Fat Layer (Subcutaneous Tissue) exposed. There is no tunneling noted, however, there is undermining starting at 9:00 and ending at 11:00 with a maximum distance of 2cm. There is a medium amount of serosanguineous drainage noted. The wound margin is well defined and not attached to the wound base. There is large (67- 100%) red granulation within the wound bed. There is no necrotic tissue within the wound  bed. Wound #4 status is Open. Original cause of wound was Gradually Appeared. The date acquired was: 10/29/2020. The wound is located on the Right,Distal,Lateral Foot. The wound measures 0.3cm length x 0.3cm width x 0.5cm depth; 0.071cm^2 area and 0.035cm^3 volume. There is Fat Layer (Subcutaneous Tissue) exposed. There is no tunneling or undermining noted. There is a medium amount of serosanguineous drainage noted. The wound margin is thickened. There is large (67-100%) red granulation within the wound bed. There is no necrotic tissue within the wound bed. Assessment Active Problems ICD-10 Type 2 diabetes mellitus with foot ulcer Non-pressure chronic ulcer of other part of right foot with necrosis of bone Disruption of external operation (surgical) wound, not elsewhere classified, sequela Non-pressure chronic ulcer of other part of left foot with other specified severity Procedures Wound #1 Pre-procedure diagnosis of Wound #1 is a Diabetic Wound/Ulcer of the Lower Extremity located on the Right Amputation Site - Transmetatarsal .Severity of Tissue Pre Debridement is: Fat layer exposed. There was a Excisional Skin/Subcutaneous Tissue Debridement with a total area of 2 sq cm performed by Worthy Keeler, PA. With the following instrument(s): Curette to remove Non-Viable tissue/material. Material removed includes Callus, Subcutaneous Tissue, Slough, and Skin: Epidermis after achieving pain control using Other (banzocaine 20% spray). No specimens were taken. A time out was conducted at 11:30, prior to the start of the procedure. A Minimum amount of bleeding was controlled with Pressure. The procedure was tolerated well with a pain level of 0 throughout and a pain level of 0 following the procedure. Post Debridement Measurements: 0.4cm length x 0.3cm width x 0.7cm depth; 0.066cm^3 volume. Character of Wound/Ulcer Post Debridement is improved. Severity of Tissue Post Debridement is: Fat layer  exposed. Post procedure Diagnosis Wound #1: Same as Pre-Procedure Wound #2 Pre-procedure diagnosis of Wound #2 is a Diabetic Wound/Ulcer of the Lower Extremity located on the Left,Plantar Foot .Severity of Tissue Pre Debridement is: Fat layer exposed. There was a Selective/Open Wound Skin/Epidermis Debridement with a total area of 27.5 sq cm performed by Worthy Keeler, PA. With the following instrument(s): Curette to remove Non-Viable tissue/material. Material removed includes Callus and Skin: Epidermis and after achieving pain control using Other (banzocaine 20% spray). No specimens were taken. A time out was conducted at 11:30, prior to the start of the procedure. A Minimum amount of bleeding was controlled with Pressure. The procedure was tolerated well with a pain level of 0 throughout and  a pain level of 0 following the procedure. Post Debridement Measurements: 0.1cm length x 0.1cm width x 0.1cm depth; 0.001cm^3 volume. Character of Wound/Ulcer Post Debridement is improved. Severity of Tissue Post Debridement is: Limited to breakdown of skin. Post procedure Diagnosis Wound #2: Same as Pre-Procedure Wound #3 Pre-procedure diagnosis of Wound #3 is a Diabetic Wound/Ulcer of the Lower Extremity located on the Right,Proximal,Lateral Foot .Severity of Tissue Pre Debridement is: Fat layer exposed. There was a Selective/Open Wound Skin/Epidermis Debridement with a total area of 16 sq cm performed by Worthy Keeler, PA. With the following instrument(s): Curette, Forceps, and Scissors to remove Non-Viable tissue/material. Material removed includes Callus and Skin: Epidermis and after achieving pain control using Other (banzocaine 20% spray). No specimens were taken. A time out was conducted at 11:30, prior to the start of the procedure. A Minimum amount of bleeding was controlled with Pressure. The procedure was tolerated well with a pain level of 0 throughout and a pain level of 0 following the  procedure. Post Debridement Measurements: 0.4cm length x 0.4cm width x 0.7cm depth; 0.088cm^3 volume. Character of Wound/Ulcer Post Debridement is improved. Severity of Tissue Post Debridement is: Fat layer exposed. Post procedure Diagnosis Wound #3: Same as Pre-Procedure Wound #4 Pre-procedure diagnosis of Wound #4 is a Diabetic Wound/Ulcer of the Lower Extremity located on the Right,Distal,Lateral Foot .Severity of Tissue Pre Debridement is: Fat layer exposed. There was a Selective/Open Wound Skin/Epidermis Debridement with a total area of 1 sq cm performed by Worthy Keeler, PA. With the following instrument(s): Curette to remove Non-Viable tissue/material. Material removed includes Callus and Skin: Epidermis and after achieving pain control using Other (banzocaine 20% spray). No specimens were taken. A time out was conducted at 11:30, prior to the start of the procedure. A Minimum amount of bleeding was controlled with Pressure. The procedure was tolerated well with a pain level of 0 throughout and a pain level of 0 following the procedure. Post Debridement Measurements: 0.3cm length x 0.3cm width x 0.5cm depth; 0.035cm^3 volume. Character of Wound/Ulcer Post Debridement is improved. Severity of Tissue Post Debridement is: Fat layer exposed. Post procedure Diagnosis Wound #4: Same as Pre-Procedure Plan Follow-up Appointments: Return Appointment in 1 week. - with Glynn Octave Shower/ Hygiene: May shower and wash wound with soap and water. - on days you change dressing Edema Control - Lymphedema / SCD / Other: Avoid standing for long periods of time. Exercise regularly Off-Loading: Open toe surgical shoe to: - Wear at all times except driving. on right foot Other: - may wear shoe with memory foam insert to left foot Additional Orders / Instructions: Follow Nutritious Diet Non Wound Condition: Other Non Wound Condition Orders/Instructions: - cushion bottom of left foot with orthopedic  felt or foam Home Health: No change in wound care orders this week; continue Home Health for wound care. May utilize formulary equivalent dressing for wound treatment orders unless otherwise specified. Other Home Health Orders/Instructions: - [facility] ordered were: MRI, lower extremity with/without contrast right foot - diabetic foot ulcer right foot WOUND #1: - Amputation Site - Transmetatarsal Wound Laterality: Right Cleanser: Soap and Water [facility]) Every Other Day/30 Days Discharge Instructions: May shower and wash wound with dial antibacterial soap and water prior to dressing change. Cleanser: Wound Cleanser Every Other Day/30 Days Discharge Instructions: Cleanse the wound with wound cleanser prior to applying a clean dressing using gauze sponges, not tissue or cotton balls. Prim Dressing: KerraCel Ag Gelling Fiber Dressing, 4x5  in (silver alginate) [name]) Every Other Day/30 Days ary Discharge Instructions: Pack into undermined area Secondary Dressing: Woven Gauze Sponge, Non-Sterile 4x4 in [facility]) Every Other Day/30 Days Discharge Instructions: Apply over primary dressing as directed. Secondary Dressing: ABD Pad, 5x9 [facility]) Every Other Day/30 Days Discharge Instructions: Apply over primary dressing as directed. Secured With: Coban Self-Adherent Wrap 4x5 (in/yd) Every Other Day/30 Days Discharge Instructions: Secure with Coban as directed. Secured With: [employer], 4.5x3.1 (in/yd) [facility]) Every Other Day/30 Days Discharge Instructions: Secure with Kerlix as directed. Secured With: [employer]ore Surgical T ape, 2x10 (in/yd) (Home Health) Every Other Day/30 Days Discharge Instructions: Secure dressing with tape as directed. WOUND #2: - Foot Wound Laterality: Plantar, Left Cleanser: Soap and Water [facility]) Every Other Day/30 Days Discharge Instructions: May shower and wash wound with dial antibacterial soap and water prior to  dressing change. Cleanser: Wound Cleanser Every Other Day/30 Days Discharge Instructions: Cleanse the wound with wound cleanser prior to applying a clean dressing using gauze sponges, not tissue or cotton balls. Prim Dressing: KerraCel Ag Gelling Fiber Dressing, 4x5 in (silver alginate) (Home Health) Every Other Day/30 Days ary Discharge Instructions: Pack into undermined area Secondary Dressing: Woven Gauze Sponge, Non-Sterile 4x4 in [facility]) Every Other Day/30 Days Discharge Instructions: Apply over primary dressing as directed. Secondary Dressing: ABD Pad, 5x9 [facility]) Every Other Day/30 Days Discharge Instructions: Apply over primary dressing as directed. Secured With: Coban Self-Adherent Wrap 4x5 (in/yd) Every Other Day/30 Days Discharge Instructions: Secure with Coban as directed. Secured With: [employer], 4.5x3.1 (in/yd) [facility]) Every Other Day/30 Days Discharge Instructions: Secure with Kerlix as directed. Secured With: [employer] Surgical T ape, 2x10 (in/yd) (Home Health) Every Other Day/30 Days Discharge Instructions: Secure dressing with tape as directed. WOUND #3: - Foot Wound Laterality: Left, Lateral Cleanser: Soap and Water [facility]) Every Other Day/30 Days Discharge Instructions: May shower and wash wound with dial antibacterial soap and water prior to dressing change. Cleanser: Wound Cleanser Every Other Day/30 Days Discharge Instructions: Cleanse the wound with wound cleanser prior to applying a clean dressing using gauze sponges, not tissue or cotton balls. Prim Dressing: KerraCel Ag Gelling Fiber Dressing, 4x5 in (silver alginate) (Home Health) Every Other Day/30 Days ary Discharge Instructions: Pack into undermined area Secondary Dressing: Woven Gauze Sponge, Non-Sterile 4x4 in [facility]) Every Other Day/30 Days Discharge Instructions: Apply over primary dressing as directed. Secondary Dressing: ABD Pad, 5x9 [facility]) Every Other  Day/30 Days Discharge Instructions: Apply over primary dressing as directed. Secured With: Coban Self-Adherent Wrap 4x5 (in/yd) Every Other Day/30 Days Discharge Instructions: Secure with Coban as directed. Secured With: [employer], 4.5x3.1 (in/yd) [facility]) Every Other Day/30 Days Discharge Instructions: Secure with Kerlix as directed. Secured With: [employer]ore Surgical T ape, 2x10 (in/yd) (Home Health) Every Other Day/30 Days Discharge Instructions: Secure dressing with tape as directed. WOUND #4: - Foot Wound Laterality: Right, Lateral, Distal Cleanser: Soap and Water [facility]) Every Other Day/30 Days Discharge Instructions: May shower and wash wound with dial antibacterial soap and water prior to dressing change. Cleanser: Wound Cleanser Every Other Day/30 Days Discharge Instructions: Cleanse the wound with wound cleanser prior to applying a clean dressing using gauze sponges, not tissue or cotton balls. Prim Dressing: KerraCel Ag Gelling Fiber Dressing, 4x5 in (silver alginate) (Home Health) Every Other Day/30 Days ary Discharge Instructions: Pack into undermined area Secondary Dressing: Woven Gauze Sponge, Non-Sterile 4x4 in [facility]) Every Other Day/30 Days  Discharge Instructions: Apply over primary dressing as directed. Secondary Dressing: ABD Pad, 5x9 [facility]) Every Other Day/30 Days Discharge Instructions: Apply over primary dressing as directed. Secured With: Coban Self-Adherent Wrap 4x5 (in/yd) Every Other Day/30 Days Discharge Instructions: Secure with Coban as directed. Secured With: [employer], 4.5x3.1 (in/yd) [facility]) Every Other Day/30 Days Discharge Instructions: Secure with Kerlix as directed. Secured With: [employer] Surgical T ape, 2x10 (in/yd) (Home Health) Every Other Day/30 Days Discharge Instructions: Secure dressing with tape as directed. 1. After the extensive callus and subcutaneous debridement today the patient did have a  couple wound areas that were not previously noted on the left foot that were noted today. With that being said I removed a significant amount of the callus not necessarily everything but it is much as I could at least today. Subsequently I think we will get a continue with the silver alginate for the time being I think that is probably still the best way to go. 2. Also can recommend that we use roll gauze and Coban to secure in place probably still the best option here. 3. I do think the patient needs to have an MRI to further evaluate the situation. Obviously if he is getting there for the MRI needs to not be wearing the silver alginate dressing for that as it will affect the MRI machine potentially. We will see patient back for reevaluation in 1 week here in the clinic. If anything worsens or changes patient will contact our office for additional recommendations. Electronic Signature(s) Signed: 10/29/2020 1:36:41 PM By: Worthy Keeler PA-C Entered By: Worthy Keeler on 10/29/2020 13:36:41 -------------------------------------------------------------------------------- SuperBill Details Patient Name: Date of Service: [name], [name] 10/29/2020 Medical Record Number: [id number] Patient Account Number: [id number] Date of Birth/Sex: Treating RN: 12-21-1970 (50 y.o. [name] Primary Care Provider: [name] Other Clinician: Referring Provider: Treating Provider/Extender: Dorris Singh, Army Melia Weeks in Treatment: 7 Diagnosis Coding ICD-10 Codes Code Description E11.621 Type 2 diabetes mellitus with foot ulcer L97.514 Non-pressure chronic ulcer of other part of right foot with necrosis of bone T81.31XS Disruption of external operation (surgical) wound, not elsewhere classified, sequela L97.528 Non-pressure chronic ulcer of other part of left foot with other specified severity Facility Procedures CPT4 Code: 62263335 Description: 45625 - DEB SUBQ TISSUE 20 SQ CM/<  ICD-10 Diagnosis Description L97.514 Non-pressure chronic ulcer of other part of right foot with necrosis of bone Modifier: Quantity: 1 CPT4 Code: 63893734 Description: 28768 - DEBRIDE WOUND 1ST 20 SQ CM OR < ICD-10 Diagnosis Description L97.514 Non-pressure chronic ulcer of other part of right foot with necrosis of bone L97.528 Non-pressure chronic ulcer of other part of left foot with other specified  severit Modifier: y Quantity: 1 CPT4 Code: 11572620 Description: 35597 - DEBRIDE WOUND EA ADDL 20 SQ CM ICD-10 Diagnosis Description L97.514 Non-pressure chronic ulcer of other part of right foot with necrosis of bone L97.528 Non-pressure chronic ulcer of other part of left foot with other specified  severity Modifier: Quantity: 2 Physician Procedures : CPT4 Code Description Modifier [id number] 36468 - WC PHYS LEVEL 4 - EST PT 25 ICD-10 Diagnosis Description E11.621 Type 2 diabetes mellitus with foot ulcer L97.514 Non-pressure chronic ulcer of other part of right foot with necrosis of bone T81.31XS  Disruption of external operation (surgical) wound, not elsewhere classified, sequela L97.528 Non-pressure chronic ulcer of other part of left foot with other specified severity Quantity: 1 : [id number] 11042 - WC PHYS SUBQ TISS 20  SQ CM ICD-10 Diagnosis Description L97.514 Non-pressure chronic ulcer of other part of right foot with necrosis of bone Quantity: 1 : [id number] - WC PHYS DEBR WO ANESTH 20 SQ CM ICD-10 Diagnosis Description L97.514 Non-pressure chronic ulcer of other part of right foot with necrosis of bone L97.528 Non-pressure chronic ulcer of other part of left foot with other specified  severity Quantity: 1 : [id number] - WC PHYS DEBR WO ANESTH EA ADD 20 CM ICD-10 Diagnosis Description L97.514 Non-pressure chronic ulcer of other part of right foot with necrosis of bone L97.528 Non-pressure chronic ulcer of other part of left foot with other specified  severity Quantity: 2 Electronic  Signature(s) Signed: 10/29/2020 1:37:32 PM By: Worthy Keeler PA-C Entered By: Worthy Keeler on 10/29/2020 13:37:31

## 2020-10-30 ENCOUNTER — Other Ambulatory Visit: Payer: Self-pay | Admitting: Nurse Practitioner

## 2020-10-30 NOTE — Telephone Encounter (Signed)
Copied from [place] [id number]. Topic: Quick Communication - Rx Refill/Question >> Oct 30, 2020  9:14 AM Tessa Lerner A wrote: Medication: Continuous Blood Gluc Sensor ([place]) MISC   Has the patient contacted their pharmacy? Yes.   (Agent: If no, request that the patient contact the pharmacy for the refill.) (Agent: If yes, when and what did the pharmacy advise?)  Preferred Pharmacy (with phone number or street name): [facility] [place]  Phone:  [phone] Fax:  [phone]  Agent: Please be advised that RX refills may take up to 3 business days. We ask that you follow-up with your pharmacy.

## 2020-10-30 NOTE — Telephone Encounter (Signed)
Last ordered by [name] on 10/27/20 with 12 refills. This request has already been ordered by other.

## 2020-11-03 ENCOUNTER — Telehealth: Payer: Self-pay | Admitting: Podiatry

## 2020-11-03 ENCOUNTER — Encounter (HOSPITAL_BASED_OUTPATIENT_CLINIC_OR_DEPARTMENT_OTHER): Payer: Medicare Other | Admitting: Physician Assistant

## 2020-11-03 ENCOUNTER — Encounter: Payer: Self-pay | Admitting: Podiatry

## 2020-11-03 NOTE — Telephone Encounter (Signed)
Fine with me

## 2020-11-03 NOTE — Telephone Encounter (Signed)
Letter sent via my chart

## 2020-11-03 NOTE — Telephone Encounter (Signed)
Patient is requesting a letter stating he relys on a wheelchair, walker, or cane for mobility. Patient states he was summoned for jury duty, but is unable to drive there  due to limited mobility. Will this be ok? Please advise.

## 2020-11-04 ENCOUNTER — Telehealth: Payer: Self-pay | Admitting: Nurse Practitioner

## 2020-11-04 ENCOUNTER — Other Ambulatory Visit: Payer: Self-pay | Admitting: Pharmacist

## 2020-11-04 MED ORDER — FREESTYLE LIBRE 2 READER DEVI: 6 refills | Status: DC

## 2020-11-04 MED ORDER — FREESTYLE LIBRE 2 SENSOR MISC: 6 refills | Status: DC

## 2020-11-04 NOTE — Telephone Encounter (Unsigned)
Home Health Verbal Orders - Caller/Agency: [facility]/ [place]  Callback Number: [phone]/ vm can be left  Requesting Skilled Nursing Frequency: 1x a week for 2 weeks and  2x's a week for 8 weeks

## 2020-11-05 ENCOUNTER — Telehealth: Payer: Self-pay

## 2020-11-05 ENCOUNTER — Other Ambulatory Visit: Payer: Self-pay

## 2020-11-05 ENCOUNTER — Encounter (HOSPITAL_BASED_OUTPATIENT_CLINIC_OR_DEPARTMENT_OTHER): Payer: Medicare Other | Admitting: Physician Assistant

## 2020-11-05 DIAGNOSIS — E11621 Type 2 diabetes mellitus with foot ulcer: Secondary | ICD-10-CM | POA: Diagnosis not present

## 2020-11-05 NOTE — Telephone Encounter (Signed)
Freestyle Libre 2 Reader and Sensor PA approved until 05/04/2021

## 2020-11-05 NOTE — Telephone Encounter (Signed)
Verbal orders given via VM 

## 2020-11-05 NOTE — Progress Notes (Addendum)
[name], [name] ([id number]) Visit Report for 11/05/2020 Chief Complaint Document Details Patient Name: Date of Service: [name], [name] 11/05/2020 10:30 A M Medical Record Number: [id number] Patient Account Number: [id number] Date of Birth/Sex: Treating RN: Feb 04, 1971 (50 y.o. [name] Primary Care Provider: Geryl [name] Other Clinician: Referring Provider: Treating Provider/Extender: Dorris Singh, Army Melia Weeks in Treatment: 8 Information Obtained from: Patient Chief Complaint 09/05/2020; patient is here for review of the wound extensively on a right TMA amputation site Electronic Signature(s) Signed: 11/05/2020 10:19:32 AM By: Joseph Keeler PA-C Entered By: [name] on 11/05/2020 10:19:32 -------------------------------------------------------------------------------- Debridement Details Patient Name: Date of Service: [name], [name] 11/05/2020 10:30 A M Medical Record Number: [id number] Patient Account Number: [id number] Date of Birth/Sex: Treating RN: [birth year] (50 y.o. [name] Primary Care Provider: Geryl [name] Other Clinician: Referring Provider: Treating Provider/Extender: Lenox Ponds Weeks in Treatment: 8 Debridement Performed for Assessment: Wound #1 Right Amputation Site - Transmetatarsal Performed By: Physician Joseph Keeler, PA Debridement Type: Debridement Severity of Tissue Pre Debridement: Fat layer exposed Level of Consciousness (Pre-procedure): Awake and Alert Pre-procedure Verification/Time Out Yes - 10:55 Taken: Start Time: 10:55 T Area Debrided (L x W): otal 2 (cm) x 2.5 (cm) = 5 (cm) Tissue and other material debrided: Viable, Non-Viable, Callus, Subcutaneous, Skin: Epidermis Level: Skin/Subcutaneous Tissue Debridement Description: Excisional Instrument: Curette Bleeding: Minimum Hemostasis Achieved: Pressure Procedural Pain: 0 Post Procedural Pain: 0 Response to Treatment:  Procedure was tolerated well Level of Consciousness (Post- Awake and Alert procedure): Post Debridement Measurements of Total Wound Length: (cm) 1.2 Width: (cm) 0.3 Depth: (cm) 0.4 Volume: (cm) 0.113 Character of Wound/Ulcer Post Debridement: Improved Severity of Tissue Post Debridement: Fat layer exposed Post Procedure Diagnosis Same as Pre-procedure Electronic Signature(s) Signed: 11/05/2020 3:55:35 PM By: Baruch Gouty RN, BSN Signed: 11/06/2020 5:47:57 PM By: Joseph Keeler PA-C Entered By: Baruch Gouty on 11/05/2020 11:04:46 -------------------------------------------------------------------------------- Debridement Details Patient Name: Date of Service: [name] RD, [name] 11/05/2020 10:30 A M Medical Record Number: [id number] Patient Account Number: [id number] Date of Birth/Sex: Treating RN: Jun 01, 1970 (50 y.o. [name] Primary Care Provider: Geryl [name] Other Clinician: Referring Provider: Treating Provider/Extender: Lenox Ponds Weeks in Treatment: 8 Debridement Performed for Assessment: Wound #3 Right,Proximal,Lateral Foot Performed By: Physician Joseph Keeler, PA Debridement Type: Debridement Severity of Tissue Pre Debridement: Fat layer exposed Level of Consciousness (Pre-procedure): Awake and Alert Pre-procedure Verification/Time Out Yes - 10:55 Taken: Start Time: 10:55 T Area Debrided (L x W): otal 0.5 (cm) x 0.5 (cm) = 0.25 (cm) Tissue and other material debrided: Viable, Non-Viable, Callus, Subcutaneous, Skin: Epidermis Level: Skin/Subcutaneous Tissue Debridement Description: Excisional Instrument: Curette Bleeding: Minimum Hemostasis Achieved: Pressure End Time: 11:06 Procedural Pain: 0 Post Procedural Pain: 0 Response to Treatment: Procedure was tolerated well Level of Consciousness (Post- Awake and Alert procedure): Post Debridement Measurements of Total Wound Length: (cm) 0.5 Width: (cm) 0.5 Depth: (cm)  0.6 Volume: (cm) 0.118 Character of Wound/Ulcer Post Debridement: Improved Severity of Tissue Post Debridement: Fat layer exposed Post Procedure Diagnosis Same as Pre-procedure Electronic Signature(s) Signed: 11/05/2020 3:55:35 PM By: Baruch Gouty RN, BSN Signed: 11/06/2020 5:47:57 PM By: Joseph Keeler PA-C Entered By: Baruch Gouty on 11/05/2020 11:07:32 -------------------------------------------------------------------------------- Debridement Details Patient Name: Date of Service: [name] RD, [name] 11/05/2020 10:30 A M Medical Record Number: [id number] Patient Account Number: [id number] Date of Birth/Sex: Treating RN: 01-09-1971 (50 y.o. [name]  Primary Care Provider: Other Clinician: Geryl [name] Referring Provider: Treating Provider/Extender: Dorris Singh, Army Melia Weeks in Treatment: 8 Debridement Performed for Assessment: Wound #4 Right,Distal,Lateral Foot Performed By: Physician Joseph Keeler, PA Debridement Type: Debridement Severity of Tissue Pre Debridement: Fat layer exposed Level of Consciousness (Pre-procedure): Awake and Alert Pre-procedure Verification/Time Out Yes - 10:55 Taken: Start Time: 10:55 T Area Debrided (L x W): otal 0.5 (cm) x 0.5 (cm) = 0.25 (cm) Tissue and other material debrided: Viable, Non-Viable, Callus, Subcutaneous, Skin: Epidermis Level: Skin/Subcutaneous Tissue Debridement Description: Excisional Instrument: Curette Bleeding: Minimum Hemostasis Achieved: Pressure Procedural Pain: 0 Post Procedural Pain: 0 Response to Treatment: Procedure was tolerated well Level of Consciousness (Post- Awake and Alert procedure): Post Debridement Measurements of Total Wound Length: (cm) 0.5 Width: (cm) 0.5 Depth: (cm) 2.2 Volume: (cm) 0.432 Character of Wound/Ulcer Post Debridement: Improved Severity of Tissue Post Debridement: Fat layer exposed Post Procedure Diagnosis Same as Pre-procedure Electronic  Signature(s) Signed: 11/05/2020 3:55:35 PM By: Baruch Gouty RN, BSN Signed: 11/06/2020 5:47:57 PM By: Joseph Keeler PA-C Entered By: Baruch Gouty on 11/05/2020 11:08:06 -------------------------------------------------------------------------------- HPI Details Patient Name: Date of Service: [name] RD, [name] 11/05/2020 10:30 A M Medical Record Number: [id number] Patient Account Number: [id number] Date of Birth/Sex: Treating RN: [birth year] (50 y.o. [name] Primary Care Provider: Geryl [name] Other Clinician: Referring Provider: Treating Provider/Extender: Lenox Ponds Weeks in Treatment: 8 History of Present Illness HPI Description: ADMISSION 09/05/2020 This is a 50 year old man who has type 2 diabetes. Essentially the problem began admitted to [facility] in December with infection gas gangrene. Cultures ultimately grew staph lugdunensis. 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 March Rummage. Shortly thereafter he developed a nonhealing wound with wound dehiscence noted on 05/09/2020. He had a wound VAC I think for a period of time after the surgery but it did not help. He has undergone an IandD and skin graft on the right foot on 05/16/2020. He underwent a further IandD on 06/04/2020 not debrided to bone. 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 Culture of the metatarsal grew Enterobacter cloacae I. He has been followed by Dr. [name] of infectious disease. Currently on Levaquin and doxycycline I think at about the 4-week of 6 mark. At the last visit with Dr. March Rummage he was supposed to have use Santyl although I do not think he has it and he has been using I think a Betadine wet-to-dry. He is referred here by Dr. [name] for our review who saw him yesterday. I cannot see that he has had imaging studies of the right foot. No MRI Past medical history  includes type 2 diabetes, in 2021 and ulcer of the left foot that ultimately healed with a wound VAC, stage III chronic kidney disease, first toe amputations bilaterally, hypertension, still a 1/2 pack/day smoker. The patient had ABIs on 08/09/2018 which was 1.00 on the right and 0.96 on the left both areas had triphasic waveforms. Not felt to have an arterial issue. 09/12/2020; 1 week follow-up. He has been doing the dressings along with home health. This is a deep punched out area in the incision of his transmit site. We use silver alginate last week to help with drainage and antibacterial properties. He tolerated this well 6/24; the patient was actually at his podiatrist this morning who debrided the wound. We have been using silver alginate. They are  going to follow him in 6 weeks. 6/29; patient is using silver alginate. Apparently saw Dr. March Rummage this week although I have not checked his note I will try to do so. He is following up in 6 weeks. This was the original transmetatarsal amputation when I first saw this 3 mid part of his amputation site was deep down to bone however this seems to have progressively close down which is gratifying to see. He still has a fairly callus uneven surface however spreading this apart its difficult to identify anything that does not look epithelialized. When I first saw this I thought he would require an extensive debridement perhaps back in the OR by Dr. March Rummage however all of this appears to be looking better and at this point I would continue with the silver alginate see if this holds together over the next several weeks. He does not have an arterial issue 7/13; 2-week follow-up. Unfortunately the area did not hold together there was drainage on his dressing. Still copious amounts of callus. It was difficult to see where this actually was open therefore I elected to go ahead with a vigorous debridement. Still using silver alginate 7/20 I brought him back after  extensive callus debridement last week. The only open area was on the medial part of the foot extensive debridement of thick surrounding callus and I was able to do identify the remaining tunneling area. 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 This is the area that we may have been following. This is smaller this week but still surrounded with thick callus He came in today with a large area of callus on the left midfoot. This I had noticed previously. HOWEVER our intake nurse clearly incorrectly documented this as opening. The callus and split there was an odor so all of this had to be removed. 10/29/2020 unfortunately upon evaluation today this patient appears to have significant mount of callus noted at this point. There does not appear to be any signs of active infection systemically which is great although locally he is having some discomfort around the lateral portion of his foot on the right. The left foot is no having a lot of callus I am not certain even has an open wound if there is anything is extremely tiny. 11/05/2020 upon evaluation today patient's wounds actually appear to be doing a bit better compared to last week since we have uncovered some of these areas I think there are some definite improvements. With that being said there is still quite a bit of tunneling and undermining at many locations that again has me concerned I still think that the MRI is the best thing to do he has that scheduled for Saturday. Electronic Signature(s) Signed: 11/05/2020 11:13:03 AM By: Joseph Keeler PA-C Entered By: [name] on 11/05/2020 11:13:03 -------------------------------------------------------------------------------- Physical Exam Details Patient Name: Date of Service: [name], [name] 11/05/2020 10:30 A M Medical Record Number: [id number] Patient Account Number: [id number] Date of Birth/Sex: Treating RN: Oct 28, 1970  (49 y.o. [name] Primary Care Provider: Geryl [name] Other Clinician: Referring Provider: Treating Provider/Extender: Dorris Singh, Zelda Weeks in Treatment: 8 Constitutional Well-nourished and well-hydrated in no acute distress. Respiratory normal breathing without difficulty. Psychiatric this patient is able to make decisions and demonstrates good insight into disease process. Alert and Oriented x 3. pleasant and cooperative. Notes Upon inspection patient's wound bed actually showed signs of good  granulation epithelization at this point. There does not appear to be any evidence of active infection at this point which is great news and overall I am extremely pleased with where things stand today. No fevers, chills, nausea, vomiting, or diarrhea. Electronic Signature(s) Signed: 11/05/2020 11:13:18 AM By: Joseph Keeler PA-C Entered By: [name] on 11/05/2020 11:13:17 -------------------------------------------------------------------------------- Physician Orders Details Patient Name: Date of Service: [name], [name] 11/05/2020 10:30 A M Medical Record Number: [id number] Patient Account Number: [id number] Date of Birth/Sex: Treating RN: 1970-12-04 (50 y.o. [name] Primary Care Provider: Other Clinician: Geryl [name] Referring Provider: Treating Provider/Extender: Lenox Ponds Weeks in Treatment: 8 Verbal / Phone Orders: No Diagnosis Coding ICD-10 Coding Code Description E11.621 Type 2 diabetes mellitus with foot ulcer L97.514 Non-pressure chronic ulcer of other part of right foot with necrosis of bone T81.31XS Disruption of external operation (surgical) wound, not elsewhere classified, sequela L97.528 Non-pressure chronic ulcer of other part of left foot with other specified severity Follow-up Appointments ppointment in 1 week. - with Margarita Grizzle Return A Bathing/ Shower/ Hygiene May shower and wash wound with soap  and water. - on days you change dressing Edema Control - Lymphedema / SCD / Other Elevate legs to the level of the heart or above for 30 minutes daily and/or when sitting, a frequency of: Avoid standing for long periods of time. Off-Loading Open toe surgical shoe to: - Wear at all times except driving. on right foot Other: - may wear shoe with memory foam insert to left foot Additional Orders / Instructions Follow Nutritious Diet Non Wound Condition Other Non Wound Condition Orders/Instructions: - cushion bottom of left foot with orthopedic felt or foam Home Health No change in wound care orders this week; continue Home Health for wound care. May utilize formulary equivalent dressing for wound treatment orders unless otherwise specified. Other Home Health Orders/Instructions: - [facility] Wound Treatment Wound #1 - Amputation Site - Transmetatarsal Wound Laterality: Right Cleanser: Soap and Water [facility]) Every Other Day/30 Days Discharge Instructions: May shower and wash wound with dial antibacterial soap and water prior to dressing change. Cleanser: Wound Cleanser Every Other Day/30 Days Discharge Instructions: Cleanse the wound with wound cleanser prior to applying a clean dressing using gauze sponges, not tissue or cotton balls. Prim Dressing: KerraCel Ag Gelling Fiber Dressing, 4x5 in (silver alginate) (Home Health) Every Other Day/30 Days ary Discharge Instructions: Pack into any undermined area Secondary Dressing: Woven Gauze Sponge, Non-Sterile 4x4 in [facility]) Every Other Day/30 Days Discharge Instructions: Apply over primary dressing as directed. Secondary Dressing: ABD Pad, 5x9 [facility]) Every Other Day/30 Days Discharge Instructions: Apply over primary dressing as directed. Secured With: [employer], 4.5x3.1 (in/yd) [facility]) Every Other Day/30 Days Discharge Instructions: Secure with Kerlix as directed. Secured With: Transpore Surgical Tape,  2x10 (in/yd) [facility]) Every Other Day/30 Days Discharge Instructions: Secure dressing with tape as directed. Wound #3 - Foot Wound Laterality: Right, Lateral, Proximal Cleanser: Soap and Water [facility]) Every Other Day/30 Days Discharge Instructions: May shower and wash wound with dial antibacterial soap and water prior to dressing change. Cleanser: Wound Cleanser Every Other Day/30 Days Discharge Instructions: Cleanse the wound with wound cleanser prior to applying a clean dressing using gauze sponges, not tissue or cotton balls. Prim Dressing: KerraCel Ag Gelling Fiber Dressing, 4x5 in (silver alginate) (Home Health) Every Other Day/30 Days ary Discharge Instructions: Pack into any undermined area Secondary Dressing: Woven Gauze  Sponge, Non-Sterile 4x4 in [facility]) Every Other Day/30 Days Discharge Instructions: Apply over primary dressing as directed. Secondary Dressing: ABD Pad, 5x9 [facility]) Every Other Day/30 Days Discharge Instructions: Apply over primary dressing as directed. Secured With: [employer], 4.5x3.1 (in/yd) [facility]) Every Other Day/30 Days Discharge Instructions: Secure with Kerlix as directed. Secured With: Transpore Surgical Tape, 2x10 (in/yd) [facility]) Every Other Day/30 Days Discharge Instructions: Secure dressing with tape as directed. Wound #4 - Foot Wound Laterality: Right, Lateral, Distal Cleanser: Soap and Water [facility]) Every Other Day/30 Days Discharge Instructions: May shower and wash wound with dial antibacterial soap and water prior to dressing change. Cleanser: Wound Cleanser Every Other Day/30 Days Discharge Instructions: Cleanse the wound with wound cleanser prior to applying a clean dressing using gauze sponges, not tissue or cotton balls. Prim Dressing: KerraCel Ag Gelling Fiber Dressing, 4x5 in (silver alginate) (Home Health) Every Other Day/30 Days ary Discharge Instructions: Pack into any undermined  area Secondary Dressing: Woven Gauze Sponge, Non-Sterile 4x4 in [facility]) Every Other Day/30 Days Discharge Instructions: Apply over primary dressing as directed. Secondary Dressing: ABD Pad, 5x9 [facility]) Every Other Day/30 Days Discharge Instructions: Apply over primary dressing as directed. Secured With: [employer], 4.5x3.1 (in/yd) [facility]) Every Other Day/30 Days Discharge Instructions: Secure with Kerlix as directed. Secured With: Transpore Surgical Tape, 2x10 (in/yd) [facility]) Every Other Day/30 Days Discharge Instructions: Secure dressing with tape as directed. Wound #5 - Foot Wound Laterality: Plantar, Right Cleanser: Soap and Water [facility]) Every Other Day/30 Days Discharge Instructions: May shower and wash wound with dial antibacterial soap and water prior to dressing change. Cleanser: Wound Cleanser Every Other Day/30 Days Discharge Instructions: Cleanse the wound with wound cleanser prior to applying a clean dressing using gauze sponges, not tissue or cotton balls. Prim Dressing: KerraCel Ag Gelling Fiber Dressing, 4x5 in (silver alginate) (Home Health) Every Other Day/30 Days ary Discharge Instructions: Pack into any undermined area Secondary Dressing: Woven Gauze Sponge, Non-Sterile 4x4 in [facility]) Every Other Day/30 Days Discharge Instructions: Apply over primary dressing as directed. Secondary Dressing: ABD Pad, 5x9 [facility]) Every Other Day/30 Days Discharge Instructions: Apply over primary dressing as directed. Secured With: [employer], 4.5x3.1 (in/yd) [facility]) Every Other Day/30 Days Discharge Instructions: Secure with Kerlix as directed. Secured With: Transpore Surgical Tape, 2x10 (in/yd) [facility]) Every Other Day/30 Days Discharge Instructions: Secure dressing with tape as directed. Electronic Signature(s) Signed: 11/05/2020 3:55:35 PM By: Baruch Gouty RN, BSN Signed: 11/06/2020 5:47:57 PM By: Joseph Keeler  PA-C Entered By: Baruch Gouty on 11/05/2020 11:13:51 -------------------------------------------------------------------------------- Problem List Details Patient Name: Date of Service: [name] RD, [name] 11/05/2020 10:30 A M Medical Record Number: [id number] Patient Account Number: [id number] Date of Birth/Sex: Treating RN: [birth year] (50 y.o. [name] Primary Care Provider: Geryl [name] Other Clinician: Referring Provider: Treating Provider/Extender: Lenox Ponds Weeks in Treatment: 8 Active Problems ICD-10 Encounter Code Description Active Date MDM Diagnosis E11.621 Type 2 diabetes mellitus with foot ulcer 09/05/2020 No Yes L97.514 Non-pressure chronic ulcer of other part of right foot with necrosis of bone 09/05/2020 No Yes T81.31XS Disruption of external operation (surgical) wound, not elsewhere classified, 09/05/2020 No Yes sequela L97.528 Non-pressure chronic ulcer of other part of left foot with other specified 10/22/2020 No Yes severity Inactive Problems ICD-10 Code Description Active Date Inactive Date M86.671 Other chronic osteomyelitis, right ankle and foot 09/05/2020 09/05/2020 Resolved Problems Electronic Signature(s) Signed:  11/05/2020 10:19:26 AM By: Joseph Keeler PA-C Entered By: [name] on 11/05/2020 10:19:26 -------------------------------------------------------------------------------- Progress Note Details Patient Name: Date of Service: [name], [name] 11/05/2020 10:30 A M Medical Record Number: [id number] Patient Account Number: [id number] Date of Birth/Sex: Treating RN: [birth year] (50 y.o. [name] Primary Care Provider: Geryl [name] Other Clinician: Referring Provider: Treating Provider/Extender: Lenox Ponds Weeks in Treatment: 8 Subjective Chief Complaint Information obtained from Patient 09/05/2020; patient is here for review of the wound extensively on a right TMA  amputation site History of Present Illness (HPI) ADMISSION 09/05/2020 This is a 50 year old man who has type 2 diabetes. Essentially the problem began admitted to [facility] health in December with infection gas gangrene. Cultures ultimately grew staph lugdunensis. 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 March Rummage. Shortly thereafter he developed a nonhealing wound with wound dehiscence noted on 05/09/2020. He had a wound VAC I think for a period of time after the surgery but it did not help. He has undergone an IandD and skin graft on the right foot on 05/16/2020. He underwent a further IandD on 06/04/2020 not debrided to bone. 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 Culture of the metatarsal grew Enterobacter cloacae I. He has been followed by Dr. [name] of infectious disease. Currently on Levaquin and doxycycline I think at about the 4-week of 6 mark. At the last visit with Dr. March Rummage he was supposed to have use Santyl although I do not think he has it and he has been using I think a Betadine wet-to-dry. He is referred here by Dr. [name] for our review who saw him yesterday. I cannot see that he has had imaging studies of the right foot. No MRI Past medical history includes type 2 diabetes, in 2021 and ulcer of the left foot that ultimately healed with a wound VAC, stage III chronic kidney disease, first toe amputations bilaterally, hypertension, still a 1/2 pack/day smoker. The patient had ABIs on 08/09/2018 which was 1.00 on the right and 0.96 on the left both areas had triphasic waveforms. Not felt to have an arterial issue. 09/12/2020; 1 week follow-up. He has been doing the dressings along with home health. This is a deep punched out area in the incision of his transmit site. We use silver alginate last week to help with drainage and antibacterial properties. He tolerated this well 6/24; the  patient was actually at his podiatrist this morning who debrided the wound. We have been using silver alginate. They are going to follow him in 6 weeks. 6/29; patient is using silver alginate. Apparently saw Dr. March Rummage this week although I have not checked his note I will try to do so. He is following up in 6 weeks. This was the original transmetatarsal amputation when I first saw this 3 mid part of his amputation site was deep down to bone however this seems to have progressively close down which is gratifying to see. He still has a fairly callus uneven surface however spreading this apart its difficult to identify anything that does not look epithelialized. When I first saw this I thought he would require an extensive debridement perhaps back in the OR by Dr. March Rummage however all of this appears to be looking better and at this point I would continue with the silver alginate see if this holds together over the next several weeks. He does  not have an arterial issue 7/13; 2-week follow-up. Unfortunately the area did not hold together there was drainage on his dressing. Still copious amounts of callus. It was difficult to see where this actually was open therefore I elected to go ahead with a vigorous debridement. Still using silver alginate 7/20 I brought him back after extensive callus debridement last week. The only open area was on the medial part of the foot extensive debridement of thick surrounding callus and I was able to do identify the remaining tunneling area. 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 This is the area that we may have been following. This is smaller this week but still surrounded with thick callus He came in today with a large area of callus on the left midfoot. This I had noticed previously. HOWEVER our intake nurse clearly incorrectly documented this as opening. The callus and split there was an odor so all of  this had to be removed. 10/29/2020 unfortunately upon evaluation today this patient appears to have significant mount of callus noted at this point. There does not appear to be any signs of active infection systemically which is great although locally he is having some discomfort around the lateral portion of his foot on the right. The left foot is no having a lot of callus I am not certain even has an open wound if there is anything is extremely tiny. 11/05/2020 upon evaluation today patient's wounds actually appear to be doing a bit better compared to last week since we have uncovered some of these areas I think there are some definite improvements. With that being said there is still quite a bit of tunneling and undermining at many locations that again has me concerned I still think that the MRI is the best thing to do he has that scheduled for Saturday. Objective Constitutional Well-nourished and well-hydrated in no acute distress. Vitals Time Taken: 10:20 AM, Height: 64 in, Weight: 188 lbs, BMI: 32.3, Temperature: 98.0 F, Pulse: 90 bpm, Respiratory Rate: 18 breaths/min, Blood Pressure: 154/92 mmHg, Capillary Blood Glucose: 160 mg/dl. Respiratory normal breathing without difficulty. Psychiatric this patient is able to make decisions and demonstrates good insight into disease process. Alert and Oriented x 3. pleasant and cooperative. General Notes: Upon inspection patient's wound bed actually showed signs of good granulation epithelization at this point. There does not appear to be any evidence of active infection at this point which is great news and overall I am extremely pleased with where things stand today. No fevers, chills, nausea, vomiting, or diarrhea. Integumentary (Hair, Skin) Wound #1 status is Open. Original cause of wound was Surgical Injury. The date acquired was: 02/28/2020. The wound has been in treatment 8 weeks. 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 The wound measures 0.5cm length x 0.2cm width x 0.6cm depth; 0.079cm^2 area and 0.047cm^3 volume. There is Fat Layer (Subcutaneous Tissue) exposed. There is no tunneling or undermining noted. There is a medium amount of serosanguineous drainage noted. The wound margin is distinct with the outline attached to the wound base. There is large (67-100%) pink, pale granulation within the wound bed. There is a small (1-33%) amount of necrotic tissue within the wound bed including Adherent Slough. Wound #2 status is Healed - Epithelialized. Original cause of wound was Gradually Appeared. The date acquired was: 10/15/2020. The wound has been in treatment 2 weeks. The wound is located on the  Left,Plantar Foot. The wound measures 0cm length x 0cm width x 0cm depth; 0cm^2 area and 0cm^3 volume. Wound #3 status is Open. Original cause of wound was Gradually Appeared. The date acquired was: 10/29/2020. The wound has been in treatment 1 weeks. The wound is located on the Right,Proximal,Lateral Foot. The wound measures 0.5cm length x 0.5cm width x 0.5cm depth; 0.196cm^2 area and 0.098cm^3 volume. There is Fat Layer (Subcutaneous Tissue) exposed. There is a medium amount of serosanguineous drainage noted. The wound margin is well defined and not attached to the wound base. There is large (67-100%) red granulation within the wound bed. There is no necrotic tissue within the wound bed. Wound #3 status is Open. Original cause of wound was Gradually Appeared. The date acquired was: 10/29/2020. The wound has been in treatment 1 weeks. The wound is located on the Right,Proximal,Lateral Foot. The wound measures 0.5cm length x 0.5cm width x 0.5cm depth; 0.196cm^2 area and 0.098cm^3 volume. There is Fat Layer (Subcutaneous Tissue) exposed. There is no tunneling or undermining noted. There is a medium amount of serosanguineous drainage noted. The wound margin is distinct with the outline attached to the wound base.  There is large (67-100%) red granulation within the wound bed. There is no necrotic tissue within the wound bed. General Notes: Calloused Periwound Wound #4 status is Open. Original cause of wound was Gradually Appeared. The date acquired was: 10/29/2020. The wound has been in treatment 1 weeks. The wound is located on the Right,Distal,Lateral Foot. The wound measures 0.3cm length x 0.2cm width x 0.7cm depth; 0.047cm^2 area and 0.033cm^3 volume. There is Fat Layer (Subcutaneous Tissue) exposed. There is no tunneling or undermining noted. There is a medium amount of serosanguineous drainage noted. The wound margin is thickened. There is large (67-100%) red granulation within the wound bed. There is no necrotic tissue within the wound bed. General Notes: slight maceration, calloused periwound Wound #5 status is Open. Original cause of wound was Gradually Appeared. The date acquired was: 10/29/2020. The wound is located on the [place]. The wound measures 0.5cm length x 0.2cm width x 0.5cm depth; 0.079cm^2 area and 0.039cm^3 volume. There is Fat Layer (Subcutaneous Tissue) exposed. There is undermining starting at 12:00 and ending at 12:00 with a maximum distance of 1.9cm. There is a medium amount of serosanguineous drainage noted. The wound margin is well defined and not attached to the wound base. There is large (67-100%) red granulation within the wound bed. There is no necrotic tissue within the wound bed. 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 There was a Excisional Skin/Subcutaneous Tissue Debridement with a total area of 5 sq cm performed by Joseph Keeler, PA. With the following instrument(s): Curette to remove Viable and Non-Viable tissue/material. Material removed includes Callus, Subcutaneous Tissue, and Skin: Epidermis. No specimens were taken. A time out was conducted at 10:55, prior to the start of the procedure. A Minimum amount of bleeding was controlled with Pressure. The procedure was tolerated well with a pain level of 0 throughout and a pain level of 0 following the  procedure. Post Debridement Measurements: 1.2cm length x 0.3cm width x 0.4cm depth; 0.113cm^3 volume. Character of Wound/Ulcer Post Debridement is improved. Severity of Tissue Post Debridement is: Fat layer exposed. Post procedure Diagnosis Wound #1: Same as Pre-Procedure Wound #3 Pre-procedure diagnosis of Wound #3 is a Diabetic Wound/Ulcer of the Lower Extremity located on the Right,Proximal,Lateral Foot .Severity of Tissue Pre Debridement is: Fat layer exposed. There was a Excisional Skin/Subcutaneous Tissue Debridement with a total area of 0.25 sq cm performed by Joseph Keeler, PA. With the following instrument(s): Curette to remove Viable and Non-Viable tissue/material. Material removed includes Callus, Subcutaneous Tissue, and Skin: Epidermis. No specimens were taken. A time out was conducted at 10:55, prior to the start of the procedure. A Minimum amount of bleeding was controlled with Pressure. The procedure was tolerated well with a pain level of 0 throughout and a pain level of 0 following the procedure. Post Debridement Measurements: 0.5cm length x 0.5cm width x 0.6cm depth; 0.118cm^3 volume. Character of Wound/Ulcer Post Debridement is improved. Severity of Tissue Post Debridement is: Fat layer exposed. Post procedure Diagnosis Wound #3: Same as Pre-Procedure Wound #4 Pre-procedure diagnosis of  Wound #4 is a Diabetic Wound/Ulcer of the Lower Extremity located on the Right,Distal,Lateral Foot .Severity of Tissue Pre Debridement is: Fat layer exposed. There was a Excisional Skin/Subcutaneous Tissue Debridement with a total area of 0.25 sq cm performed by Joseph Keeler, PA. With the following instrument(s): Curette to remove Viable and Non-Viable tissue/material. Material removed includes Callus, Subcutaneous Tissue, and Skin: Epidermis. No specimens were taken. A time out was conducted at 10:55, prior to the start of the procedure. A Minimum amount of bleeding was controlled with Pressure. The procedure was tolerated well with a pain level of 0 throughout and a pain level of 0 following the procedure. Post Debridement Measurements: 0.5cm length x 0.5cm width x 2.2cm depth; 0.432cm^3 volume. Character of Wound/Ulcer Post Debridement is improved. Severity of Tissue Post Debridement is: Fat layer exposed. Post procedure Diagnosis Wound #4: Same as Pre-Procedure Plan Follow-up Appointments: Return Appointment in 1 week. - with Glynn Octave Shower/ Hygiene: May shower and wash wound with soap and water. - on days you change dressing Edema Control - Lymphedema / SCD / Other: Elevate legs to the level of the heart or above for 30 minutes daily and/or when sitting, a frequency of: Avoid standing for long periods of time. Off-Loading: Open toe surgical shoe to: - Wear at all times except driving. on right foot Other: - may wear shoe with memory foam insert to left foot Additional Orders / Instructions: Follow Nutritious Diet Non Wound Condition: Other Non Wound Condition Orders/Instructions: - cushion bottom of left foot with orthopedic felt or foam Home Health: No change in wound care orders this week; continue Home Health for wound care. May utilize formulary equivalent dressing for wound treatment orders unless otherwise specified. Other Home Health Orders/Instructions: - [place] #1: - Amputation Site - Transmetatarsal Wound Laterality: Right Cleanser: Soap and Water [facility]) Every Other Day/30 Days Discharge Instructions: May shower and wash wound with dial antibacterial soap and water prior to dressing change. Cleanser: Wound Cleanser Every Other Day/30 Days Discharge Instructions: Cleanse the wound with wound cleanser prior to applying a clean dressing using gauze sponges, not tissue or cotton balls. Prim Dressing: KerraCel Ag Gelling Fiber Dressing, 4x5 in (silver alginate) (Home Health) Every Other Day/30 Days ary Discharge Instructions: Pack into any undermined area Secondary Dressing: Woven Gauze Sponge,  Non-Sterile 4x4 in [facility]) Every Other Day/30 Days Discharge Instructions: Apply over primary dressing as directed. Secondary Dressing: ABD Pad, 5x9 [facility]) Every Other Day/30 Days Discharge Instructions: Apply over primary dressing as directed. Secured With: Coban Self-Adherent Wrap 4x5 (in/yd) Every Other Day/30 Days Discharge Instructions: Secure with Coban as directed. Secured With: [employer], 4.5x3.1 (in/yd) [facility]) Every Other Day/30 Days Discharge Instructions: Secure with Kerlix as directed. Secured With: Transpore Surgical T ape, 2x10 (in/yd) (Home Health) Every Other Day/30 Days Discharge Instructions: Secure dressing with tape as directed. WOUND #3: - Foot Wound Laterality: Right, Lateral, Proximal Cleanser: Soap and Water [facility]) Every Other Day/30 Days Discharge Instructions: May shower and wash wound with dial antibacterial soap and water prior to dressing change. Cleanser: Wound Cleanser Every Other Day/30 Days Discharge Instructions: Cleanse the wound with wound cleanser prior to applying a clean dressing using gauze sponges, not tissue or cotton balls. Prim Dressing: KerraCel Ag Gelling Fiber Dressing, 4x5 in (silver alginate) (Home Health) Every Other Day/30 Days ary Discharge Instructions:  Pack into any undermined area Secondary Dressing: Woven Gauze Sponge, Non-Sterile 4x4 in [facility]) Every Other Day/30 Days Discharge Instructions: Apply over primary dressing as directed. Secondary Dressing: ABD Pad, 5x9 [facility]) Every Other Day/30 Days Discharge Instructions: Apply over primary dressing as directed. Secured With: Coban Self-Adherent Wrap 4x5 (in/yd) Every Other Day/30 Days Discharge Instructions: Secure with Coban as directed. Secured With: [employer], 4.5x3.1 (in/yd) [facility]) Every Other Day/30 Days Discharge Instructions: Secure with Kerlix as directed. Secured With: Transpore Surgical T ape, 2x10 (in/yd) (Home Health) Every Other Day/30 Days Discharge Instructions: Secure dressing with tape as directed. WOUND #4: - Foot Wound Laterality: Right, Lateral, Distal Cleanser: Soap and Water [facility]) Every Other Day/30 Days Discharge Instructions: May shower and wash wound with dial antibacterial soap and water prior to dressing change. Cleanser: Wound Cleanser Every Other Day/30 Days Discharge Instructions: Cleanse the wound with wound cleanser prior to applying a clean dressing using gauze sponges, not tissue or cotton balls. Prim Dressing: KerraCel Ag Gelling Fiber Dressing, 4x5 in (silver alginate) (Home Health) Every Other Day/30 Days ary Discharge Instructions: Pack into any undermined area Secondary Dressing: Woven Gauze Sponge, Non-Sterile 4x4 in [facility]) Every Other Day/30 Days Discharge Instructions: Apply over primary dressing as directed. Secondary Dressing: ABD Pad, 5x9 [facility]) Every Other Day/30 Days Discharge Instructions: Apply over primary dressing as directed. Secured With: Coban Self-Adherent Wrap 4x5 (in/yd) Every Other Day/30 Days Discharge Instructions: Secure with Coban as directed. Secured With: [employer], 4.5x3.1 (in/yd) [facility]) Every Other Day/30 Days Discharge Instructions: Secure with Kerlix as  directed. Secured With: Transpore Surgical T ape, 2x10 (in/yd) (Home Health) Every Other Day/30 Days Discharge Instructions: Secure dressing with tape as directed. WOUND #5: - Foot Wound Laterality: Plantar, Right Cleanser: Soap and Water [facility]) Every Other Day/30 Days Discharge Instructions: May shower and wash wound with dial antibacterial soap and water prior to dressing change. Cleanser: Wound Cleanser Every Other Day/30 Days Discharge Instructions: Cleanse the wound with wound cleanser prior to applying a clean dressing using gauze sponges, not tissue or cotton balls. Prim Dressing: KerraCel Ag Gelling Fiber Dressing, 4x5 in (silver alginate) (Home Health) Every Other Day/30 Days ary Discharge Instructions: Pack into any undermined area Secondary Dressing: Woven Gauze Sponge, Non-Sterile 4x4 in [facility]) Every Other Day/30 Days Discharge Instructions: Apply over primary dressing as directed. Secondary Dressing: ABD Pad, 5x9 [facility]) Every Other  Day/30 Days Discharge Instructions: Apply over primary dressing as directed. Secured With: Coban Self-Adherent Wrap 4x5 (in/yd) Every Other Day/30 Days Discharge Instructions: Secure with Coban as directed. Secured With: [employer], 4.5x3.1 (in/yd) [facility]) Every Other Day/30 Days Discharge Instructions: Secure with Kerlix as directed. Secured With: Transpore Surgical T ape, 2x10 (in/yd) (Home Health) Every Other Day/30 Days Discharge Instructions: Secure dressing with tape as directed. 1. Would recommend currently that we going continue with the wound care measures as before I did perform a fairly extensive debridement of multiple locations today were again removing callus overall I think he is doing quite well with this and I am very pleased. With that being said I still think there could be something underlying all this that is because so many areas of tunneling/undermining/sinus tracking at this point. I do believe  that we need to do what we can in order to identify and get these areas to improve. For that reason he does have an MRI on Saturday. 2. I am also can recommend that we continue with silver alginate to all open wound locations. 3. I would also suggest that we continue with an ABD pad to cover followed by roll gauze to both pad as well as secure and this will help as well catching drainage. We will see patient back for reevaluation in 1 week here in the clinic. If anything worsens or changes patient will contact our office for additional recommendations. Electronic Signature(s) Signed: 11/05/2020 11:14:15 AM By: Joseph Keeler PA-C Entered By: [name] on 11/05/2020 11:14:15 -------------------------------------------------------------------------------- SuperBill Details Patient Name: Date of Service: [name], [name] 11/05/2020 Medical Record Number: [id number] Patient Account Number: [id number] Date of Birth/Sex: Treating RN: 1971/03/01 (49 y.o. [name] Primary Care Provider: Geryl [name] Other Clinician: Referring Provider: Treating Provider/Extender: Dorris Singh, [employer] Melia Weeks in Treatment: 8 Diagnosis Coding ICD-10 Codes Code Description E11.621 Type 2 diabetes mellitus with foot ulcer L97.514 Non-pressure chronic ulcer of other part of right foot with necrosis of bone T81.31XS Disruption of external operation (surgical) wound, not elsewhere classified, sequela L97.528 Non-pressure chronic ulcer of other part of left foot with other specified severity Facility Procedures CPT4 Code: [id number] Description: 44818 - DEB SUBQ TISSUE 20 SQ CM/< ICD-10 Diagnosis Description L97.514 Non-pressure chronic ulcer of other part of right foot with necrosis of bone L97.528 Non-pressure chronic ulcer of other part of left foot with other specified seve Modifier: rity Quantity: 1 Physician Procedures : CPT4 Code Description Modifier [id number] - WC PHYS SUBQ  TISS 20 SQ CM ICD-10 Diagnosis Description L97.514 Non-pressure chronic ulcer of other part of right foot with necrosis of bone L97.528 Non-pressure chronic ulcer of other part of left foot  with other specified severity Quantity: 1 Electronic Signature(s) Signed: 11/05/2020 11:14:21 AM By: Joseph Keeler PA-C Entered By: [name] on 11/05/2020 11:14:20

## 2020-11-06 ENCOUNTER — Encounter: Payer: Medicare Other | Admitting: Registered"

## 2020-11-08 ENCOUNTER — Encounter (HOSPITAL_COMMUNITY): Payer: Self-pay

## 2020-11-08 ENCOUNTER — Ambulatory Visit (HOSPITAL_COMMUNITY): Admission: RE | Admit: 2020-11-08 | Payer: Medicare Other | Source: Ambulatory Visit

## 2020-11-11 NOTE — Progress Notes (Signed)
STAN, CANTAVE ([id number]) Visit Report for 11/05/2020 Arrival Information Details Patient Name: Date of Service: [name]. 11/05/2020 10:30 A M Medical Record Number: [id number] Patient Account Number: [id number] Date of Birth/Sex: Treating RN: 05-30-1970 (50 y.o. [name] Primary Care [name]: [name] Other Clinician: Referring [name]: Treating [name]/Extender: [name]: 8 Visit Information History Since Last Visit Added or deleted any medications: No Patient Arrived: [name] Any new allergies or adverse reactions: No Arrival Time: 10:19 Had a fall or experienced change in No Transfer Assistance: None activities of daily living that may affect Patient Identification Verified: Yes risk of falls: Secondary Verification Process Completed: Yes Signs or symptoms of abuse/neglect since last visito No Patient Requires Transmission-Based Precautions: No Hospitalized since last visit: No Patient Has Alerts: No Implantable device outside of the clinic excluding No cellular tissue based products placed in the center since last visit: Has Dressing in Place as Prescribed: Yes Pain Present Now: No Electronic Signature(s) Signed: 11/05/2020 3:42:23 PM By: Lorrin Jackson Entered By: Lorrin Jackson on 11/05/2020 10:20:21 -------------------------------------------------------------------------------- Encounter Discharge Information Details Patient Name: Date of Service: [name], [name]. 11/05/2020 10:30 A M Medical Record Number: [id number] Patient Account Number: [id number] Date of Birth/Sex: Treating RN: Sep 25, 1970 (50 y.o. [name], [name] Primary Care [name]: [name] Other Clinician: Referring Philamena Kramar: Treating [name]/Extender: [name]: 8 Encounter Discharge Information Items Post Procedure Vitals Discharge Condition: Stable Temperature (F):  97.7 Ambulatory Status: Ambulatory Pulse (bpm): 74 Discharge Destination: Home Respiratory Rate (breaths/min): 17 Transportation: Private Auto Blood Pressure (mmHg): 147/74 Accompanied By: self Schedule Follow-up Appointment: Yes Clinical Summary of Care: Patient Declined Electronic Signature(s) Signed: 11/05/2020 3:40:52 PM By: Rhae Hammock RN Entered By: Rhae Hammock on 11/05/2020 11:26:30 -------------------------------------------------------------------------------- Lower Extremity Assessment Details Patient Name: Date of Service: [name], [name]. 11/05/2020 10:30 A M Medical Record Number: [id number] Patient Account Number: [id number] Date of Birth/Sex: Treating RN: 26-Jun-1970 (50 y.o. [name] Primary Care [name]: [name] Other Clinician: Referring [name]: Treating Erique Kaser/Extender: [name], [name] Weeks in [name]: 8 Edema Assessment Assessed: [Left: No] [Right: Yes] Edema: [Left: N] [Right: o] Calf Left: Right: Point of Measurement: From Medial Instep 34 cm Ankle Left: Right: Point of Measurement: From Medial Instep 22 cm Vascular Assessment Pulses: Dorsalis Pedis Palpable: [Right:Yes] Electronic Signature(s) Signed: 11/05/2020 3:42:23 PM By: Lorrin Jackson Entered By: Lorrin Jackson on 11/05/2020 10:23:36 -------------------------------------------------------------------------------- Multi-Disciplinary Care Plan Details Patient Name: Date of Service: [name], [name]. 11/05/2020 10:30 A M Medical Record Number: [id number] Patient Account Number: [id number] Date of Birth/Sex: Treating RN: Dec 05, 1970 (50 y.o. [name] Primary Care Samyah Bilbo: [name] Other Clinician: Referring [name]: Treating [name]/Extender: [name], [name] Weeks in [name]: 8 Active Inactive Wound/Skin Impairment Nursing Diagnoses: Impaired tissue integrity Goals: Patient/caregiver will  verbalize understanding of skin care regimen Date Initiated: 09/05/2020 Target Resolution Date: 12/03/2020 Goal Status: Active Ulcer/skin breakdown will have a volume reduction of 30% by week 4 Date Initiated: 09/05/2020 Date Inactivated: 10/08/2020 Target Resolution Date: 10/03/2020 Goal Status: Met Interventions: Assess patient/caregiver ability to obtain necessary supplies Assess patient/caregiver ability to perform ulcer/skin care regimen upon admission and as needed Assess ulceration(s) every visit Provide education on ulcer and skin care [name] Activities: Patient referred to home care : 09/05/2020 Skin care regimen initiated : 09/05/2020 Topical wound management initiated : 09/05/2020 Notes: Electronic Signature(s) Signed: 11/05/2020 3:55:35 PM By: Baruch Gouty  RN, BSN Entered By: Baruch Gouty on 11/05/2020 10:54:31 -------------------------------------------------------------------------------- Pain Assessment Details Patient Name: Date of Service: [name]. 11/05/2020 10:30 A M Medical Record Number: [id number] Patient Account Number: [id number] Date of Birth/Sex: Treating RN: 1970-11-23 (50 y.o. [name] Primary Care [name]: [name] Other Clinician: Referring [name]: Treating [name]/Extender: [name], [name] Weeks in [name]: 8 Active Problems Location of Pain Severity and Description of Pain Patient Has Paino No Site Locations Pain Management and Medication Current Pain Management: Electronic Signature(s) Signed: 11/05/2020 3:42:23 PM By: Lorrin Jackson Entered By: Lorrin Jackson on 11/05/2020 10:20:45 -------------------------------------------------------------------------------- Patient/Caregiver Education Details Patient Name: Date of Service: [name], [name]. 8/10/2022andnbsp10:30 A M Medical Record Number: [id number] Patient Account Number: [id number] Date of Birth/Gender: Treating RN: 1970-12-15 (50  y.o. [name] Primary Care Physician: [name] Other Clinician: Referring Physician: Treating Physician/Extender: Noni Saupe in [name]: 8 Education Assessment Education Provided To: Patient Education Topics Provided Elevated Blood Sugar/ Impact on Healing: Methods: Explain/Verbal Responses: Reinforcements needed, State content correctly Offloading: Methods: Explain/Verbal Responses: Reinforcements needed, State content correctly Wound/Skin Impairment: Methods: Explain/Verbal Responses: Reinforcements needed, State content correctly Electronic Signature(s) Signed: 11/05/2020 3:55:35 PM By: Baruch Gouty RN, BSN Entered By: Baruch Gouty on 11/05/2020 10:55:39 -------------------------------------------------------------------------------- Wound Assessment Details Patient Name: Date of Service: [name], [name]. 11/05/2020 10:30 A M Medical Record Number: [id number] Patient Account Number: [id number] Date of Birth/Sex: Treating RN: Jul 20, 1970 (50 y.o. 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 Secondary Open Surgical Wound Wounding Event: Surgical Injury Etiology: Date Acquired: 02/28/2020 Wound Open Weeks Of [name]: 8 Status: Clustered Wound: No Comorbid Anemia, Coronary Artery Disease, Hypertension, Type II History: Diabetes, Osteomyelitis, Neuropathy Photos Wound Measurements Length: (cm) 0.5 Width: (cm) 0.2 Depth: (cm) 0.6 Area: (cm) 0.079 Volume: (cm) 0.047 % Reduction in Area: 93.5% % Reduction in Volume: 95.1% Epithelialization: Medium (34-66%) Tunneling: No Undermining: No Wound Description Classification: Grade 3 Wound  Margin: Distinct, outline attached Exudate Amount: Medium Exudate Type: Serosanguineous Exudate Color: red, brown Foul Odor After Cleansing: No Slough/Fibrino Yes Wound Bed Granulation Amount: Large (67-100%) Exposed Structure Granulation Quality: Pink, Pale Fascia Exposed: No Necrotic Amount: Small (1-33%) Fat Layer (Subcutaneous Tissue) Exposed: Yes Necrotic Quality: Adherent Slough Tendon Exposed: No Muscle Exposed: No Joint Exposed: No Bone Exposed: No [name] Notes Wound #1 (Amputation Site - Transmetatarsal) Wound Laterality: Right Cleanser Soap and Water Discharge Instruction: May shower and wash wound with dial antibacterial soap and water prior to dressing change. Wound Cleanser Discharge Instruction: Cleanse the wound with wound cleanser prior to applying a clean dressing using gauze sponges, not tissue or cotton balls. Peri-Wound Care Topical Primary Dressing KerraCel Ag Gelling Fiber Dressing, 4x5 in (silver alginate) Discharge Instruction: Pack into any undermined area Secondary Dressing Woven Gauze Sponge, Non-Sterile 4x4 in Discharge Instruction: Apply over primary dressing as directed. ABD Pad, 5x9 Discharge Instruction: Apply over primary dressing as directed. Secured With [employer], 4.5x3.1 (in/yd) Discharge Instruction: Secure with Kerlix as directed. Transpore Surgical Tape, 2x10 (in/yd) Discharge Instruction: Secure dressing with tape as directed. Compression Wrap Compression Stockings Add-Ons Electronic Signature(s) Signed: 11/05/2020 3:42:23 PM By: Lorrin Jackson Signed: 11/11/2020 11:32:43 AM By: Sandre Kitty Entered By: Sandre Kitty on 11/05/2020 10:29:48 -------------------------------------------------------------------------------- Wound Assessment Details  Patient Name: Date of Service: [name]. 11/05/2020 10:30 A M Medical Record Number: [id number] Patient Account Number: [id number] Date of  Birth/Sex: Treating RN: [birth year] (50 y.o. [name] Primary Care Mirl Hillery: [name] Other Clinician: Referring Hayle Parisi: Treating Lorelie Biermann/Extender: [name], [name] Weeks in [name]: 8 Wound Status Wound Number: 2 Primary Diabetic Wound/Ulcer of the Lower Extremity Etiology: Wound Location: Left, Plantar Foot Wound Healed - Epithelialized Wounding Event: Gradually Appeared Status: Date Acquired: 10/15/2020 Comorbid Anemia, Coronary Artery Disease, Hypertension, Type II Weeks Of [name]: 2 History: Diabetes, Osteomyelitis, Neuropathy Clustered Wound: No Photos Wound Measurements Length: (cm) 0 Width: (cm) 0 Depth: (cm) 0 Area: (cm) 0 Volume: (cm) 0 % Reduction in Area: 100% % Reduction in Volume: 100% Wound Description Classification: Grade 1 Electronic Signature(s) Signed: 11/05/2020 3:42:23 PM By: Lorrin Jackson Signed: 11/11/2020 11:32:43 AM By: Sandre Kitty Entered By: Sandre Kitty on 11/05/2020 10:30:54 -------------------------------------------------------------------------------- Wound Assessment Details Patient Name: Date of Service: [name], [name]. 11/05/2020 10:30 A M Medical Record Number: [id number] Patient Account Number: [id number] Date of Birth/Sex: Treating RN: [birth year] (50 y.o. [name] Primary Care Leaira Fullam: [name] Other Clinician: Referring Nello Corro: Treating Sham Alviar/Extender: [name], [name] Weeks in [name]: 8 Wound Status Wound Number: 3 Primary Diabetic Wound/Ulcer of the Lower Extremity Etiology: Wound Location: Right, Proximal, Lateral Foot Wound Open Wounding Event: Gradually Appeared Status: Date Acquired: 10/29/2020 Comorbid Anemia, Coronary Artery Disease, Hypertension, Type II Weeks Of [name]: 1 History: Diabetes, Osteomyelitis, Neuropathy Clustered Wound: No Wound Measurements Length: (cm) 0.5 Width: (cm) 0.5 Depth: (cm) 0.5 Area: (cm)  0.196 Volume: (cm) 0.098 Wound Description Classification: Grade 2 Wound Margin: Distinct, outline attached Exudate Amount: Medium Exudate Type: Serosanguineous Exudate Color: red, brown Foul Odor After Cleansing: Slough/Fibrino % Reduction in Area: -55.6% % Reduction in Volume: -11.4% Tunneling: No Undermining: No No No Wound Bed Granulation Amount: Large (67-100%) Exposed Structure Granulation Quality: Red Fascia Exposed: No Necrotic Amount: None Present (0%) Fat Layer (Subcutaneous Tissue) Exposed: Yes Tendon Exposed: No Muscle Exposed: No Joint Exposed: No Bone Exposed: No Assessment Notes Calloused Periwound Electronic Signature(s) Signed: 11/05/2020 3:42:23 PM By: Lorrin Jackson Entered By: Lorrin Jackson on 11/05/2020 10:31:38 -------------------------------------------------------------------------------- Wound Assessment Details Patient Name: Date of Service: [name], [name]. 11/05/2020 10:30 A M Medical Record Number: [id number] Patient Account Number: [id number] Date of Birth/Sex: Treating RN: April 04, 1970 (50 y.o. [name] Primary Care Ryka Beighley: [name] Other Clinician: Referring Qusay Villada: Treating Damaris Geers/Extender: [name], [name] Weeks in [name]: 8 Wound Status Wound Number: 3 Primary Diabetic Wound/Ulcer of the Lower Extremity Etiology: Wound Location: Right, Proximal, Lateral Foot Wound Open Wounding Event: Gradually Appeared Status: Date Acquired: 10/29/2020 Comorbid Anemia, Coronary Artery Disease, Hypertension, Type II Weeks Of [name]: 1 History: Diabetes, Osteomyelitis, Neuropathy Clustered Wound: No Photos Wound Measurements Length: (cm) 0.5 Width: (cm) 0.5 Depth: (cm) 0.5 Area: (cm) 0.196 Volume: (cm) 0.098 % Reduction in Area: -55.6% % Reduction in Volume: -11.4% Tunneling: No Undermining: No Wound Description Classification: Grade 2 Wound Margin: Distinct, outline attached Exudate  Amount: Medium Exudate Type: Serosanguineous Exudate Color: red, brown Foul Odor After Cleansing: No Slough/Fibrino No Wound Bed Granulation Amount: Large (67-100%) Exposed Structure Granulation Quality: Red Fascia Exposed: No Necrotic Amount: None Present (0%) Fat Layer (Subcutaneous Tissue) Exposed: Yes Tendon Exposed: No Muscle Exposed: No Joint Exposed: No Bone Exposed: No Assessment Notes Calloused Periwound [name] Notes Wound #3 (Foot) Wound Laterality: Right, Lateral, Proximal Cleanser Soap and Water  Discharge Instruction: May shower and wash wound with dial antibacterial soap and water prior to dressing change. Wound Cleanser Discharge Instruction: Cleanse the wound with wound cleanser prior to applying a clean dressing using gauze sponges, not tissue or cotton balls. Peri-Wound Care Topical Primary Dressing KerraCel Ag Gelling Fiber Dressing, 4x5 in (silver alginate) Discharge Instruction: Pack into any undermined area Secondary Dressing Woven Gauze Sponge, Non-Sterile 4x4 in Discharge Instruction: Apply over primary dressing as directed. ABD Pad, 5x9 Discharge Instruction: Apply over primary dressing as directed. Secured With [employer], 4.5x3.1 (in/yd) Discharge Instruction: Secure with Kerlix as directed. Transpore Surgical Tape, 2x10 (in/yd) Discharge Instruction: Secure dressing with tape as directed. Compression Wrap Compression Stockings Add-Ons Electronic Signature(s) Signed: 11/05/2020 3:36:01 PM By: [name] Signed: 11/05/2020 3:42:23 PM By: Lorrin Jackson Entered By: [name] on 11/05/2020 11:19:03 -------------------------------------------------------------------------------- Wound Assessment Details Patient Name: Date of Service: [name], [name]. 11/05/2020 10:30 A M Medical Record Number: [id number] Patient Account Number: [id number] Date of Birth/Sex: Treating RN: 24-May-1970 (50 y.o. [name] Primary Care  Diem Pagnotta: [name] Other Clinician: Referring Dory Verdun: Treating Saul Fabiano/Extender: [name], [name] Weeks in [name]: 8 Wound Status Wound Number: 4 Primary Diabetic Wound/Ulcer of the Lower Extremity Etiology: Wound Location: Right, Distal, Lateral Foot Wound Open Wounding Event: Gradually Appeared Status: Date Acquired: 10/29/2020 Comorbid Anemia, Coronary Artery Disease, Hypertension, Type II Weeks Of [name]: 1 History: Diabetes, Osteomyelitis, Neuropathy Clustered Wound: No Photos Wound Measurements Length: (cm) 0.3 Width: (cm) 0.2 Depth: (cm) 0.7 Area: (cm) 0.047 Volume: (cm) 0.033 % Reduction in Area: 33.8% % Reduction in Volume: 5.7% Epithelialization: Small (1-33%) Tunneling: No Undermining: No Wound Description Classification: Grade 2 Wound Margin: Thickened Exudate Amount: Medium Exudate Type: Serosanguineous Exudate Color: red, brown Foul Odor After Cleansing: No Slough/Fibrino No Wound Bed Granulation Amount: Large (67-100%) Exposed Structure Granulation Quality: Red Fascia Exposed: No Necrotic Amount: None Present (0%) Fat Layer (Subcutaneous Tissue) Exposed: Yes Tendon Exposed: No Muscle Exposed: No Joint Exposed: No Bone Exposed: No Assessment Notes slight maceration, calloused periwound [name] Notes Wound #4 (Foot) Wound Laterality: Right, Lateral, Distal Cleanser Soap and Water Discharge Instruction: May shower and wash wound with dial antibacterial soap and water prior to dressing change. Wound Cleanser Discharge Instruction: Cleanse the wound with wound cleanser prior to applying a clean dressing using gauze sponges, not tissue or cotton balls. Peri-Wound Care Topical Primary Dressing KerraCel Ag Gelling Fiber Dressing, 4x5 in (silver alginate) Discharge Instruction: Pack into any undermined area Secondary Dressing Woven Gauze Sponge, Non-Sterile 4x4 in Discharge Instruction: Apply over primary dressing  as directed. ABD Pad, 5x9 Discharge Instruction: Apply over primary dressing as directed. Secured With [employer], 4.5x3.1 (in/yd) Discharge Instruction: Secure with Kerlix as directed. Transpore Surgical Tape, 2x10 (in/yd) Discharge Instruction: Secure dressing with tape as directed. Compression Wrap Compression Stockings Add-Ons Electronic Signature(s) Signed: 11/05/2020 3:36:01 PM By: [name] Signed: 11/05/2020 3:42:23 PM By: Lorrin Jackson Entered By: [name] on 11/05/2020 11:20:20 -------------------------------------------------------------------------------- Wound Assessment Details Patient Name: Date of Service: [name], [name]. 11/05/2020 10:30 A M Medical Record Number: [id number] Patient Account Number: [id number] Date of Birth/Sex: Treating RN: 09/27/1970 (50 y.o. [name] Primary Care Jocilyn Trego: [name] Other Clinician: Referring Trei Schoch: Treating Shereece Wellborn/Extender: [name], [name] Weeks in [name]: 8 Wound Status Wound Number: 5 Primary Diabetic Wound/Ulcer of the Lower Extremity Etiology: Wound Location: Right, Plantar Foot Wound Open Wounding Event: Gradually Appeared Status: Date Acquired: 10/29/2020 Comorbid Anemia, Coronary  Artery Disease, Hypertension, Type II Weeks Of [name]: 0 History: Diabetes, Osteomyelitis, Neuropathy Clustered Wound: No Photos Wound Measurements Length: (cm) 0.5 Width: (cm) 0.2 Depth: (cm) 0.5 Area: (cm) 0.079 Volume: (cm) 0.039 % Reduction in Area: 0% % Reduction in Volume: 0% Undermining: Yes Starting Position (o'clock): 12 Ending Position (o'clock): 12 Maximum Distance: (cm) 1.9 Wound Description Classification: Grade 2 Wound Margin: Well defined, not attached Exudate Amount: Medium Exudate Type: Serosanguineous Exudate Color: red, brown Foul Odor After Cleansing: No Slough/Fibrino No Wound Bed Granulation Amount: Large (67-100%) Exposed  Structure Granulation Quality: Red Fascia Exposed: No Necrotic Amount: None Present (0%) Fat Layer (Subcutaneous Tissue) Exposed: Yes Tendon Exposed: No Muscle Exposed: No Joint Exposed: No Bone Exposed: No [name] Notes Wound #5 (Foot) Wound Laterality: Plantar, Right Cleanser Soap and Water Discharge Instruction: May shower and wash wound with dial antibacterial soap and water prior to dressing change. Wound Cleanser Discharge Instruction: Cleanse the wound with wound cleanser prior to applying a clean dressing using gauze sponges, not tissue or cotton balls. Peri-Wound Care Topical Primary Dressing KerraCel Ag Gelling Fiber Dressing, 4x5 in (silver alginate) Discharge Instruction: Pack into any undermined area Secondary Dressing Woven Gauze Sponge, Non-Sterile 4x4 in Discharge Instruction: Apply over primary dressing as directed. ABD Pad, 5x9 Discharge Instruction: Apply over primary dressing as directed. Secured With [employer], 4.5x3.1 (in/yd) Discharge Instruction: Secure with Kerlix as directed. Transpore Surgical Tape, 2x10 (in/yd) Discharge Instruction: Secure dressing with tape as directed. Compression Wrap Compression Stockings Add-Ons Electronic Signature(s) Signed: 11/05/2020 3:36:01 PM By: [name] Signed: 11/05/2020 3:55:35 PM By: Baruch Gouty RN, BSN Entered By: [name] on 11/05/2020 11:21:16 -------------------------------------------------------------------------------- Vitals Details Patient Name: Date of Service: [name], [name]. 11/05/2020 10:30 A M Medical Record Number: [id number] Patient Account Number: [id number] Date of Birth/Sex: Treating RN: July 18, 1970 (50 y.o. [name] Primary Care Tequlia Gonsalves: [name] Other Clinician: Referring Norah Devin: Treating Minette Manders/Extender: [name], [name] Weeks in [name]: 8 Vital Signs Time Taken: 10:20 Temperature (F): 98.0 Height (in): 64 Pulse  (bpm): 90 Weight (lbs): 188 Respiratory Rate (breaths/min): 18 Body Mass Index (BMI): 32.3 Blood Pressure (mmHg): 154/92 Capillary Blood Glucose (mg/dl): 160 Reference Range: 80 - 120 mg / dl Electronic Signature(s) Signed: 11/05/2020 3:42:23 PM By: Lorrin Jackson Entered By: Lorrin Jackson on 11/05/2020 10:22:34

## 2020-11-12 ENCOUNTER — Encounter (HOSPITAL_BASED_OUTPATIENT_CLINIC_OR_DEPARTMENT_OTHER): Payer: Medicare Other | Admitting: Physician Assistant

## 2020-11-15 ENCOUNTER — Ambulatory Visit (HOSPITAL_COMMUNITY)
Admission: RE | Admit: 2020-11-15 | Discharge: 2020-11-15 | Disposition: A | Discharge status: Home / Self Care | Payer: Medicare Other | Source: Ambulatory Visit | Attending: Physician Assistant | Admitting: Physician Assistant

## 2020-11-15 ENCOUNTER — Other Ambulatory Visit: Payer: Self-pay

## 2020-11-15 DIAGNOSIS — L97514 Non-pressure chronic ulcer of other part of right foot with necrosis of bone: Secondary | ICD-10-CM | POA: Diagnosis present

## 2020-11-15 MED ORDER — GADOBUTROL 1 MMOL/ML IV SOLN
8.3000 mL | Freq: Once | INTRAVENOUS | Status: AC | PRN
  Administered 2020-11-15: 8.3 mL via INTRAVENOUS

## 2020-11-24 ENCOUNTER — Ambulatory Visit (INDEPENDENT_AMBULATORY_CARE_PROVIDER_SITE_OTHER): Payer: Medicare Other | Admitting: Licensed Clinical Social Worker

## 2020-11-24 ENCOUNTER — Other Ambulatory Visit: Payer: Self-pay

## 2020-11-24 ENCOUNTER — Telehealth: Payer: Self-pay | Admitting: Nurse Practitioner

## 2020-11-24 DIAGNOSIS — F330 Major depressive disorder, recurrent, mild: Secondary | ICD-10-CM

## 2020-11-24 NOTE — Telephone Encounter (Signed)
[name] with [facility] called saying after Sept 1 his insurance changes and he can not use them anymore.  The doctor will have to use another home health agency that his insurance will pay for.  He will have [name] # is [phone]
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
## 2020-11-25 ENCOUNTER — Ambulatory Visit: Payer: Medicare Other | Attending: Nurse Practitioner | Admitting: Pharmacist

## 2020-11-25 ENCOUNTER — Encounter: Payer: Self-pay | Admitting: Pharmacist

## 2020-11-25 ENCOUNTER — Other Ambulatory Visit: Payer: Self-pay

## 2020-11-25 DIAGNOSIS — E1142 Type 2 diabetes mellitus with diabetic polyneuropathy: Secondary | ICD-10-CM

## 2020-11-25 DIAGNOSIS — Z794 Long term (current) use of insulin: Secondary | ICD-10-CM

## 2020-11-25 LAB — GLUCOSE, POCT (MANUAL RESULT ENTRY): POC Glucose: 134 mg/dl — AB (ref 70–99)

## 2020-11-25 MED ORDER — TRULICITY 1.5 MG/0.5ML ~~LOC~~ SOAJ
1.5000 mg | SUBCUTANEOUS | 2 refills | Status: DC
  Filled 2020-11-25 – 2020-11-26 (×2): qty 2, 28d supply, fill #0
  Filled 2021-01-01: qty 2, 28d supply, fill #1

## 2020-11-25 NOTE — Progress Notes (Signed)
    S:    PCP: Zelda   No chief complaint on file.  Patient arrives in good spirits.  Presents for diabetes evaluation, education, and management. Patient was referred and last seen by Primary Care Provider on 09/23/2020. I saw him on 7/90/2409 and started Trulicity.    Today, he denies any NV, abdominal pain since addition of Trulicity. Denies any visual changes.   Family/Social History:  -Fhx: DM, HTN, HLD -Tobacco: 0.5 PPD smoker  -Alcohol: none reported  Insurance coverage/medication affordability:  Medicaid  Medication adherence reported.   Current diabetes medications include: Humalog sliding scale, Lantus 15 units BID, Trulicity 7.35 mg weekly Current hypertension medications include: amlodipine 10 mg daily clonidine 0.1 mg BID, carvedilol 25 mg BID Current hyperlipidemia medications include: atorvastatin 20 mg daily  Patient denies hypoglycemic events.  Patient reported dietary habits:  - Patient admits to dietary indiscretion  - He had pizza last night  - He saw a nutritionist on 10/15/2020. He says he is trying to implement changes   Patient-reported exercise habits:  - Limited d/t limited mobility    Patient denies nocturia (nighttime urination).  Patient denies neuropathy (nerve pain). Patient denies visual changes. Patient reports self foot exams.    O:  POCT: 134   Lab Results  Component Value Date   HGBA1C 10.8 (A) 09/23/2020   There were no vitals filed for this visit.  Lipid Panel     Component Value Date/Time   CHOL 97 (L) 10/10/2019 0958   TRIG 43 10/10/2019 0958   HDL 47 10/10/2019 0958   CHOLHDL 2.1 10/10/2019 0958   CHOLHDL 3 10/17/2018 1441   VLDL 18.0 10/17/2018 1441   LDLCALC 39 10/10/2019 0958   Home fasting blood sugars: (180-200)  Clinical Atherosclerotic Cardiovascular Disease (ASCVD): No  The ASCVD Risk score Mikey Bussing DC Jr., et al., 2013) failed to calculate for the following reasons:   The valid total cholesterol range is 130  to 320 mg/dL   A/P: Diabetes longstanding currently uncontrolled. Patient is able to verbalize appropriate hypoglycemia management plan. Medication adherence appears appropriate. He is tolerating Trulicity well. Amenable to increase to 1.5 mg dose.  -Increase Trulicity 1.5 mg weekly.  -Continued Lantus 15 units BID. -Extensively discussed pathophysiology of diabetes, recommended lifestyle interventions, dietary effects on blood sugar control -Counseled on s/sx of and management of hypoglycemia -Next A1C anticipated 11/2020.   Written patient instructions provided.  Total time in face to face counseling 30 minutes.   Follow up PCP Clinic Visit in 1 month.    Benard Halsted, PharmD, [facility], [place] [phone]

## 2020-11-26 ENCOUNTER — Telehealth (INDEPENDENT_AMBULATORY_CARE_PROVIDER_SITE_OTHER): Payer: Medicare Other | Admitting: Family

## 2020-11-26 ENCOUNTER — Ambulatory Visit (INDEPENDENT_AMBULATORY_CARE_PROVIDER_SITE_OTHER): Payer: Medicare Other | Admitting: Internal Medicine

## 2020-11-26 ENCOUNTER — Encounter (HOSPITAL_BASED_OUTPATIENT_CLINIC_OR_DEPARTMENT_OTHER): Payer: Medicare Other | Attending: Physician Assistant | Admitting: Physician Assistant

## 2020-11-26 ENCOUNTER — Encounter: Payer: Self-pay | Admitting: Family

## 2020-11-26 ENCOUNTER — Other Ambulatory Visit: Payer: Self-pay | Admitting: Nurse Practitioner

## 2020-11-26 ENCOUNTER — Other Ambulatory Visit: Payer: Self-pay

## 2020-11-26 ENCOUNTER — Encounter: Payer: Self-pay | Admitting: Internal Medicine

## 2020-11-26 ENCOUNTER — Telehealth: Payer: Self-pay

## 2020-11-26 VITALS — BP 165/92 | HR 94 | Temp 97.6°F | Wt 191.0 lb

## 2020-11-26 DIAGNOSIS — Z7689 Persons encountering health services in other specified circumstances: Secondary | ICD-10-CM | POA: Diagnosis not present

## 2020-11-26 DIAGNOSIS — L97528 Non-pressure chronic ulcer of other part of left foot with other specified severity: Secondary | ICD-10-CM | POA: Insufficient documentation

## 2020-11-26 DIAGNOSIS — L97514 Non-pressure chronic ulcer of other part of right foot with necrosis of bone: Secondary | ICD-10-CM | POA: Insufficient documentation

## 2020-11-26 DIAGNOSIS — T8781 Dehiscence of amputation stump: Secondary | ICD-10-CM | POA: Diagnosis not present

## 2020-11-26 DIAGNOSIS — F17210 Nicotine dependence, cigarettes, uncomplicated: Secondary | ICD-10-CM | POA: Diagnosis not present

## 2020-11-26 DIAGNOSIS — E1122 Type 2 diabetes mellitus with diabetic chronic kidney disease: Secondary | ICD-10-CM | POA: Diagnosis not present

## 2020-11-26 DIAGNOSIS — Z89412 Acquired absence of left great toe: Secondary | ICD-10-CM | POA: Insufficient documentation

## 2020-11-26 DIAGNOSIS — E11621 Type 2 diabetes mellitus with foot ulcer: Secondary | ICD-10-CM | POA: Diagnosis present

## 2020-11-26 DIAGNOSIS — Y835 Amputation of limb(s) as the cause of abnormal reaction of the patient, or of later complication, without mention of misadventure at the time of the procedure: Secondary | ICD-10-CM | POA: Insufficient documentation

## 2020-11-26 DIAGNOSIS — L089 Local infection of the skin and subcutaneous tissue, unspecified: Secondary | ICD-10-CM | POA: Diagnosis not present

## 2020-11-26 DIAGNOSIS — Z89431 Acquired absence of right foot: Secondary | ICD-10-CM | POA: Insufficient documentation

## 2020-11-26 DIAGNOSIS — I129 Hypertensive chronic kidney disease with stage 1 through stage 4 chronic kidney disease, or unspecified chronic kidney disease: Secondary | ICD-10-CM | POA: Insufficient documentation

## 2020-11-26 DIAGNOSIS — E11628 Type 2 diabetes mellitus with other skin complications: Secondary | ICD-10-CM

## 2020-11-26 DIAGNOSIS — N183 Chronic kidney disease, stage 3 unspecified: Secondary | ICD-10-CM | POA: Diagnosis not present

## 2020-11-26 LAB — MICROALBUMIN / CREATININE URINE RATIO
Creatinine, Urine: 85.1 mg/dL
Microalb/Creat Ratio: 808 mg/g creat — ABNORMAL HIGH (ref 0–29)
Microalbumin, Urine: 687.8 ug/mL

## 2020-11-26 MED ORDER — LISINOPRIL 2.5 MG PO TABS: 2.5000 mg | ORAL_TABLET | Freq: Every day | ORAL | 3 refills | Status: DC

## 2020-11-26 NOTE — Telephone Encounter (Signed)
Trulicity PA approved until 11/26/21

## 2020-11-26 NOTE — Progress Notes (Signed)
BP Readings from Last 3 Encounters:  11/26/20 (!) 165/92  10/17/20 139/78  10/14/20 (!) 155/86
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
## 2020-11-26 NOTE — Progress Notes (Signed)
CEJAY, CAMBRE ([id number]) Visit Report for 11/26/2020 Arrival Information Details Patient Name: Date of Service: [name], [name]. 11/26/2020 11:00 A M Medical Record Number: [id number] Patient Account Number: [id number] Date of Birth/Sex: Treating RN: 08-Nov-1970 (50 y.o. [name], [name] Primary Care Hebah Bogosian: [name] Other Clinician: Referring Lexa Coronado: Treating Brandace Cargle/Extender: Lenox Ponds Weeks in Treatment: 11 Visit Information History Since Last Visit All ordered tests and consults were completed: Yes Patient Arrived: Cane Added or deleted any medications: No Arrival Time: 10:58 Any new allergies or adverse reactions: No Accompanied By: self Had a fall or experienced change in No Transfer Assistance: None activities of daily living that may affect Patient Identification Verified: Yes risk of falls: Secondary Verification Process Completed: Yes Signs or symptoms of abuse/neglect since last visito No Patient Requires Transmission-Based Precautions: No Hospitalized since last visit: No Patient Has Alerts: No Implantable device outside of the clinic excluding No cellular tissue based products placed in the center since last visit: Has Dressing in Place as Prescribed: Yes Pain Present Now: Yes Electronic Signature(s) Signed: 11/26/2020 4:49:46 PM By: Baruch Gouty RN, BSN Entered By: Baruch Gouty on 11/26/2020 11:06:06 -------------------------------------------------------------------------------- Lower Extremity Assessment Details Patient Name: Date of Service: [name], [name] 11/26/2020 11:00 A M Medical Record Number: [id number] Patient Account Number: [id number] Date of Birth/Sex: Treating RN: Oct 30, 1970 (50 y.o. [name] Primary Care Sharah Finnell: [name] Other Clinician: Referring Kenniyah Sasaki: Treating Shephanie Romas/Extender: Dorris Singh, Zelda Weeks in Treatment: 11 Edema Assessment Assessed: [Left:  No] [Right: No] Edema: [Left: N] [Right: o] Calf Left: Right: Point of Measurement: From Medial Instep 35 cm Ankle Left: Right: Point of Measurement: From Medial Instep 23 cm Vascular Assessment Pulses: Dorsalis Pedis Palpable: [Right:Yes] Electronic Signature(s) Signed: 11/26/2020 4:49:46 PM By: Baruch Gouty RN, BSN Entered By: Baruch Gouty on 11/26/2020 11:10:32 -------------------------------------------------------------------------------- [place] Details Patient Name: Date of Service: [name], [name] 11/26/2020 11:00 A M Medical Record Number: [id number] Patient Account Number: [id number] Date of Birth/Sex: Treating RN: September 11, 1970 (50 y.o. [name] Primary Care Neelah Mannings: [name] Other Clinician: Referring Aldene Hendon: Treating Dianelly Ferran/Extender: Lenox Ponds Weeks in Treatment: 11 Active Inactive Osteomyelitis Nursing Diagnoses: Infection: osteomyelitis Knowledge deficit related to disease process and management Goals: Patient/caregiver will verbalize understanding of disease process and disease management Date Initiated: 11/26/2020 Target Resolution Date: 12/24/2020 Goal Status: Active Patient's osteomyelitis will resolve Date Initiated: 11/26/2020 Target Resolution Date: 12/24/2020 Goal Status: Active Interventions: Assess for signs and symptoms of osteomyelitis resolution every visit Provide education on osteomyelitis Screen for HBO Treatment Activities: Systemic antibiotics : 11/26/2020 Notes: Wound/Skin Impairment Nursing Diagnoses: Impaired tissue integrity Goals: Patient/caregiver will verbalize understanding of skin care regimen Date Initiated: 09/05/2020 Target Resolution Date: 12/03/2020 Goal Status: Active Ulcer/skin breakdown will have a volume reduction of 30% by week 4 Date Initiated: 09/05/2020 Date Inactivated: 10/08/2020 Target Resolution Date: 10/03/2020 Goal Status:  Met Interventions: Assess patient/caregiver ability to obtain necessary supplies Assess patient/caregiver ability to perform ulcer/skin care regimen upon admission and as needed Assess ulceration(s) every visit Provide education on ulcer and skin care Treatment Activities: Patient referred to home care : 09/05/2020 Skin care regimen initiated : 09/05/2020 Topical wound management initiated : 09/05/2020 Notes: Electronic Signature(s) Signed: 11/26/2020 4:49:46 PM By: Baruch Gouty RN, BSN Entered By: Baruch Gouty on 11/26/2020 11:18:13 -------------------------------------------------------------------------------- Pain Assessment Details Patient Name: Date of Service: [name], [name] 11/26/2020 11:00 A M Medical Record Number: [id number] Patient Account  Number: [id number] Date of Birth/Sex: Treating RN: [birth year] (50 y.o. [name] Primary Care Octavian Godek: [name] Other Clinician: Referring Dalaney Needle: Treating Mariaha Ellington/Extender: Lenox Ponds Weeks in Treatment: 11 Active Problems Location of Pain Severity and Description of Pain Patient Has Paino Yes Site Locations Pain Location: Generalized Pain, Pain in Ulcers With Dressing Change: No Duration of the Pain. Constant / Intermittento Intermittent Rate the pain. Current Pain Level: 4 Least Pain Level: 0 Character of Pain Describe the Pain: Aching, Throbbing Pain Management and Medication Current Pain Management: Rest: Yes Is the Current Pain Management Adequate: Adequate How does your wound impact your activities of daily livingo Sleep: No Bathing: No Appetite: No Relationship With Others: No Bladder Continence: No Emotions: No Bowel Continence: No Work: No Toileting: No Drive: No Dressing: No Hobbies: No Electronic Signature(s) Signed: 11/26/2020 4:49:46 PM By: Baruch Gouty RN, BSN Entered By: Baruch Gouty on 11/26/2020  11:08:00 -------------------------------------------------------------------------------- Patient/Caregiver Education Details Patient Name: Date of Service: [name], [place] 8/31/2022andnbsp11:00 A M Medical Record Number: [id number] Patient Account Number: [id number] Date of Birth/Gender: Treating RN: 04-Dec-1970 (50 y.o. [name] Primary Care Physician: [name] Other Clinician: Referring Physician: Treating Physician/Extender: Noni Saupe in Treatment: 11 Education Assessment Education Provided To: Patient Education Topics Provided Infection: Methods: Explain/Verbal Responses: Reinforcements needed, State content correctly Wound/Skin Impairment: Methods: Explain/Verbal Responses: Reinforcements needed, State content correctly Electronic Signature(s) Signed: 11/26/2020 4:49:46 PM By: Baruch Gouty RN, BSN Entered By: Baruch Gouty on 11/26/2020 11:18:40 -------------------------------------------------------------------------------- Wound Assessment Details Patient Name: Date of Service: [name], [name] 11/26/2020 11:00 A M Medical Record Number: [id number] Patient Account Number: [id number] Date of Birth/Sex: Treating RN: February 10, 1971 (50 y.o. [name] Primary Care Zariah Jost: [name] Other Clinician: Referring Yizel Canby: Treating Yeray Tomas/Extender: Dorris Singh, Army Melia Weeks in Treatment: 11 Wound Status Wound Number: 1 Primary Diabetic Wound/Ulcer of the Lower Extremity Etiology: Wound Location: Right Amputation Site - Transmetatarsal Secondary Open Surgical Wound Wounding Event: Surgical Injury Etiology: Date Acquired: 02/28/2020 Wound Open Weeks Of Treatment: 11 Status: Clustered Wound: No Comorbid Anemia, Coronary Artery Disease, Hypertension, Type II History: Diabetes, Osteomyelitis, Neuropathy Photos Wound Measurements Length: (cm) Width: (cm) Depth: (cm) Area: (cm) Volume:  (cm) 0 % Reduction in Area: 100% 0 % Reduction in Volume: 100% 0 Epithelialization: Large (67-100%) 0 Tunneling: No 0 Undermining: No Wound Description Classification: Grade 3 Exudate Amount: None Present Foul Odor After Cleansing: No Slough/Fibrino No Wound Bed Granulation Amount: None Present (0%) Exposed Structure Necrotic Amount: None Present (0%) Fascia Exposed: No Fat Layer (Subcutaneous Tissue) Exposed: No Tendon Exposed: No Muscle Exposed: No Joint Exposed: No Bone Exposed: No Electronic Signature(s) Signed: 11/26/2020 4:49:46 PM By: Baruch Gouty RN, BSN Entered By: Baruch Gouty on 11/26/2020 11:22:41 -------------------------------------------------------------------------------- Wound Assessment Details Patient Name: Date of Service: [name], [name] 11/26/2020 11:00 A M Medical Record Number: [id number] Patient Account Number: [id number] Date of Birth/Sex: Treating RN: 1970/09/19 (50 y.o. [name] Primary Care Carmen Tolliver: [name] Other Clinician: Referring Davied Nocito: Treating Gwenn Teodoro/Extender: Dorris Singh, Zelda Weeks in Treatment: 11 Wound Status Wound Number: 3 Primary Diabetic Wound/Ulcer of the Lower Extremity Etiology: Wound Location: Right, Proximal, Lateral Foot Wound Open Wounding Event: Gradually Appeared Status: Date Acquired: 10/29/2020 Comorbid Anemia, Coronary Artery Disease, Hypertension, Type II Weeks Of Treatment: 4 History: Diabetes, Osteomyelitis, Neuropathy Clustered Wound: No Photos Wound Measurements Length: (cm) 0.4 Width: (cm) 0.3 Depth: (cm) 1.4 Area: (cm) 0.094 Volume: (cm)  0.132 % Reduction in Area: 25.4% % Reduction in Volume: -50% Epithelialization: Large (67-100%) Tunneling: No Undermining: Yes Starting Position (o'clock): 12 Ending Position (o'clock): 12 Maximum Distance: (cm) 0.4 Wound Description Classification: Grade 3 Wagner Verification: MRI Wound Margin:  Thickened Exudate Amount: Medium Exudate Type: Serosanguineous Exudate Color: red, brown Foul Odor After Cleansing: No Slough/Fibrino No Wound Bed Granulation Amount: Large (67-100%) Exposed Structure Granulation Quality: Red Fascia Exposed: No Necrotic Amount: None Present (0%) Fat Layer (Subcutaneous Tissue) Exposed: Yes Tendon Exposed: No Muscle Exposed: No Joint Exposed: No Bone Exposed: No Electronic Signature(s) Signed: 11/26/2020 4:49:46 PM By: Baruch Gouty RN, BSN Entered By: Baruch Gouty on 11/26/2020 12:25:05 -------------------------------------------------------------------------------- Wound Assessment Details Patient Name: Date of Service: [name], [name] 11/26/2020 11:00 A M Medical Record Number: [id number] Patient Account Number: [id number] Date of Birth/Sex: Treating RN: 03-20-1971 (50 y.o. [name] Primary Care Preethi Scantlebury: [name] Other Clinician: Referring Jag Lenz: Treating Lola Czerwonka/Extender: Dorris Singh, Zelda Weeks in Treatment: 11 Wound Status Wound Number: 4 Primary Diabetic Wound/Ulcer of the Lower Extremity Etiology: Wound Location: Right, Distal, Lateral Foot Wound Healed - Epithelialized Wounding Event: Gradually Appeared Status: Date Acquired: 10/29/2020 Comorbid Anemia, Coronary Artery Disease, Hypertension, Type II Weeks Of Treatment: 4 History: Diabetes, Osteomyelitis, Neuropathy Clustered Wound: No Photos Wound Measurements Length: (cm) Width: (cm) Depth: (cm) Area: (cm) Volume: (cm) 0 % Reduction in Area: 100% 0 % Reduction in Volume: 100% 0 Epithelialization: Large (67-100%) 0 Tunneling: No 0 Undermining: No Wound Description Classification: Grade 2 Exudate Amount: None Present Foul Odor After Cleansing: No Slough/Fibrino No Wound Bed Granulation Amount: None Present (0%) Exposed Structure Necrotic Amount: None Present (0%) Fascia Exposed: No Fat Layer (Subcutaneous Tissue)  Exposed: No Tendon Exposed: No Muscle Exposed: No Joint Exposed: No Bone Exposed: No Electronic Signature(s) Signed: 11/26/2020 4:49:46 PM By: Baruch Gouty RN, BSN Entered By: Baruch Gouty on 11/26/2020 12:14:54 -------------------------------------------------------------------------------- Wound Assessment Details Patient Name: Date of Service: [name], [name] 11/26/2020 11:00 A M Medical Record Number: [id number] Patient Account Number: [id number] Date of Birth/Sex: Treating RN: [birth year] (50 y.o. [name] Primary Care Aissa Lisowski: [name] Other Clinician: Referring Zamier Eggebrecht: Treating Raymie Trani/Extender: Dorris Singh, Zelda Weeks in Treatment: 11 Wound Status Wound Number: 5 Primary Diabetic Wound/Ulcer of the Lower Extremity Etiology: Wound Location: Right, Plantar Foot Wound Open Wounding Event: Gradually Appeared Status: Date Acquired: 10/29/2020 Comorbid Anemia, Coronary Artery Disease, Hypertension, Type II Weeks Of Treatment: 3 History: Diabetes, Osteomyelitis, Neuropathy Clustered Wound: No Photos Wound Measurements Length: (cm) Width: (cm) Depth: (cm) Area: (cm) Volume: (cm) 0 % Reduction in Area: 100% 0 % Reduction in Volume: 100% 0 Epithelialization: Large (67-100%) 0 Tunneling: No 0 Undermining: No Wound Description Classification: Grade 2 Exudate Amount: None Present Foul Odor After Cleansing: No Slough/Fibrino No Wound Bed Granulation Amount: None Present (0%) Exposed Structure Necrotic Amount: None Present (0%) Fascia Exposed: No Fat Layer (Subcutaneous Tissue) Exposed: No Tendon Exposed: No Muscle Exposed: No Joint Exposed: No Bone Exposed: No Electronic Signature(s) Signed: 11/26/2020 4:49:46 PM By: Baruch Gouty RN, BSN Entered By: Baruch Gouty on 11/26/2020 11:25:59 -------------------------------------------------------------------------------- [place] Details Patient Name: Date of  Service: [name], [name] 11/26/2020 11:00 A M Medical Record Number: [id number] Patient Account Number: [id number] Date of Birth/Sex: Treating RN: 22-Jan-1971 (50 y.o. [name] Primary Care Terrie Haring: [name] Other Clinician: Referring Genoa Freyre: Treating Dorothy Polhemus/Extender: Dorris Singh, Zelda Weeks in Treatment: 11 Vital Signs Time Taken: 11:06 Temperature (F): 97.9 Height (  in): 64 Pulse (bpm): 87 Source: Stated Respiratory Rate (breaths/min): 18 Weight (lbs): 188 Blood Pressure (mmHg): 153/88 Source: Stated Capillary Blood Glucose (mg/dl): 170 Body Mass Index (BMI): 32.3 Reference Range: 80 - 120 mg / dl Notes glucose per pt report this am Electronic Signature(s) Signed: 11/26/2020 4:49:46 PM By: Baruch Gouty RN, BSN Entered By: Baruch Gouty on 11/26/2020 11:07:14

## 2020-11-26 NOTE — Patient Instructions (Signed)
Grossly your foot appears the same as before  The mri is not entirely reflective of success of treatment  Regardless there is a real high possibility you'll develop infection again   Unless there is obvious sign of active infection (swelling/pain/fever/chill/discharge, increased size ulcer), then you'll need to either see dr Sharol Given (if you accept the fact you might need amputation), or [name] (if you want to push ahead with salvaging the foot). Either case we'll need at least debridement and bone culture to guide the next course of antibiotics

## 2020-11-26 NOTE — Progress Notes (Signed)
Pt presents for telemedicine visit to establish care °

## 2020-11-26 NOTE — Progress Notes (Addendum)
KANON, NOVOSEL ([id number]) Visit Report for 11/26/2020 Chief Complaint Document Details Patient Name: Date of Service: [place] RD, [name] 11/26/2020 11:00 A M Medical Record Number: [id number] Patient Account Number: [id number] Date of Birth/Sex: Treating RN: 1970-06-29 (50 y.o. [name] Primary Care Provider: [name] Other Clinician: Referring Provider: Treating Provider/Extender: Lenox Ponds Weeks in Treatment: 11 Information Obtained from: Patient Chief Complaint 09/05/2020; patient is here for review of the wound extensively on a right TMA amputation site Electronic Signature(s) Signed: 11/26/2020 11:23:27 AM By: Worthy Keeler PA-C Entered By: Worthy Keeler on 11/26/2020 11:23:26 -------------------------------------------------------------------------------- Debridement Details Patient Name: Date of Service: [name], [name] 11/26/2020 11:00 A M Medical Record Number: [id number] Patient Account Number: [id number] Date of Birth/Sex: Treating RN: 19-Feb-1971 (50 y.o. [name] Primary Care Provider: [name] Other Clinician: Referring Provider: Treating Provider/Extender: Lenox Ponds Weeks in Treatment: 11 Debridement Performed for Assessment: Wound #3 Right,Proximal,Lateral Foot Performed By: Physician Worthy Keeler, PA Debridement Type: Debridement Severity of Tissue Pre Debridement: Fat layer exposed Level of Consciousness (Pre-procedure): Awake and Alert Pre-procedure Verification/Time Out Yes - 12:00 Taken: Start Time: 12:01 T Area Debrided (L x W): otal 0.8 (cm) x 0.5 (cm) = 0.4 (cm) Tissue and other material debrided: Viable, Non-Viable, Callus, Slough, Subcutaneous, Skin: Epidermis, Slough Level: Skin/Subcutaneous Tissue Debridement Description: Excisional Instrument: Curette Bleeding: Minimum Hemostasis Achieved: Pressure End Time: 12:18 Procedural Pain: 0 Post Procedural Pain:  0 Response to Treatment: Procedure was tolerated well Level of Consciousness (Post- Awake and Alert procedure): Post Debridement Measurements of Total Wound Length: (cm) 0.5 Width: (cm) 0.5 Depth: (cm) 1.4 Volume: (cm) 0.275 Character of Wound/Ulcer Post Debridement: Improved Severity of Tissue Post Debridement: Fat layer exposed Post Procedure Diagnosis Same as Pre-procedure Electronic Signature(s) Signed: 11/26/2020 4:49:46 PM By: Baruch Gouty RN, BSN Signed: 11/26/2020 5:21:16 PM By: Worthy Keeler PA-C Entered By: Baruch Gouty on 11/26/2020 12:18:22 -------------------------------------------------------------------------------- Debridement Details Patient Name: Date of Service: [name], [name] 11/26/2020 11:00 A M Medical Record Number: [id number] Patient Account Number: [id number] Date of Birth/Sex: Treating RN: 05/12/1970 (50 y.o. [name] Primary Care Provider: [name] Other Clinician: Referring Provider: Treating Provider/Extender: Lenox Ponds Weeks in Treatment: 11 Debridement Performed for Assessment: Wound #1 Right Amputation Site - Transmetatarsal Performed By: Physician Worthy Keeler, PA Debridement Type: Debridement Severity of Tissue Pre Debridement: Fat layer exposed Level of Consciousness (Pre-procedure): Awake and Alert Pre-procedure Verification/Time Out Yes - 12:00 Taken: Start Time: 12:01 T Area Debrided (L x W): otal 3 (cm) x 3 (cm) = 9 (cm) Tissue and other material debrided: Non-Viable, Callus, Skin: Epidermis Level: Skin/Epidermis Debridement Description: Selective/Open Wound Instrument: Curette Bleeding: None End Time: 12:18 Procedural Pain: 0 Post Procedural Pain: 0 Response to Treatment: Procedure was tolerated well Level of Consciousness (Post- Awake and Alert procedure): Post Debridement Measurements of Total Wound Length: (cm) 0.1 Width: (cm) 0.1 Depth: (cm) 0.1 Volume: (cm)  0.001 Character of Wound/Ulcer Post Debridement: Improved Severity of Tissue Post Debridement: Fat layer exposed Post Procedure Diagnosis Same as Pre-procedure Electronic Signature(s) Signed: 11/26/2020 4:49:46 PM By: Baruch Gouty RN, BSN Signed: 11/26/2020 5:21:16 PM By: Worthy Keeler PA-C Entered By: Baruch Gouty on 11/26/2020 12:20:32 -------------------------------------------------------------------------------- HPI Details Patient Name: Date of Service: [name], [name] 11/26/2020 11:00 A M Medical Record Number: [id number] Patient Account Number: [id number] Date of Birth/Sex: Treating RN: [birth year] (50 y.o. [name]  Primary Care Provider: Other Clinician: [name] Referring Provider: Treating Provider/Extender: Lenox Ponds Weeks in Treatment: 11 History of Present Illness HPI Description: ADMISSION 09/05/2020 This is a 50 year old man who has type 2 diabetes. Essentially the problem began admitted to [facility] health in December with infection gas gangrene. Cultures ultimately grew staph lugdunensis. 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 March Rummage. Shortly thereafter he developed a nonhealing wound with wound dehiscence noted on 05/09/2020. He had a wound VAC I think for a period of time after the surgery but it did not help. He has undergone an IandD and skin graft on the right foot on 05/16/2020. He underwent a further IandD on 06/04/2020 not debrided to bone. 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 Culture of the metatarsal grew Enterobacter cloacae I. He has been followed by Dr. [name] of infectious disease. Currently on Levaquin and doxycycline I think at about the 4-week of 6 mark. At the last visit with Dr. March Rummage he was supposed to have use Santyl although I do not think he has it and he has been using I think a Betadine  wet-to-dry. He is referred here by Dr. [name] for our review who saw him yesterday. I cannot see that he has had imaging studies of the right foot. No MRI Past medical history includes type 2 diabetes, in 2021 and ulcer of the left foot that ultimately healed with a wound VAC, stage III chronic kidney disease, first toe amputations bilaterally, hypertension, still a 1/2 pack/day smoker. The patient had ABIs on 08/09/2018 which was 1.00 on the right and 0.96 on the left both areas had triphasic waveforms. Not felt to have an arterial issue. 09/12/2020; 1 week follow-up. He has been doing the dressings along with home health. This is a deep punched out area in the incision of his transmit site. We use silver alginate last week to help with drainage and antibacterial properties. He tolerated this well 6/24; the patient was actually at his podiatrist this morning who debrided the wound. We have been using silver alginate. They are going to follow him in 6 weeks. 6/29; patient is using silver alginate. Apparently saw Dr. March Rummage this week although I have not checked his note I will try to do so. He is following up in 6 weeks. This was the original transmetatarsal amputation when I first saw this 3 mid part of his amputation site was deep down to bone however this seems to have progressively close down which is gratifying to see. He still has a fairly callus uneven surface however spreading this apart its difficult to identify anything that does not look epithelialized. When I first saw this I thought he would require an extensive debridement perhaps back in the OR by Dr. March Rummage however all of this appears to be looking better and at this point I would continue with the silver alginate see if this holds together over the next several weeks. He does not have an arterial issue 7/13; 2-week follow-up. Unfortunately the area did not hold together there was drainage on his dressing. Still copious amounts of callus. It  was difficult to see where this actually was open therefore I elected to go ahead with a vigorous debridement. Still using silver alginate 7/20 I brought him back after extensive callus debridement last week. The only open area was on the medial part of the foot extensive debridement  of thick surrounding callus and I was able to do identify the remaining tunneling area. 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 This is the area that we may have been following. This is smaller this week but still surrounded with thick callus He came in today with a large area of callus on the left midfoot. This I had noticed previously. HOWEVER our intake nurse clearly incorrectly documented this as opening. The callus and split there was an odor so all of this had to be removed. 10/29/2020 unfortunately upon evaluation today this patient appears to have significant mount of callus noted at this point. There does not appear to be any signs of active infection systemically which is great although locally he is having some discomfort around the lateral portion of his foot on the right. The left foot is no having a lot of callus I am not certain even has an open wound if there is anything is extremely tiny. 11/05/2020 upon evaluation today patient's wounds actually appear to be doing a bit better compared to last week since we have uncovered some of these areas I think there are some definite improvements. With that being said there is still quite a bit of tunneling and undermining at many locations that again has me concerned I still think that the MRI is the best thing to do he has that scheduled for Saturday. 11/26/2020 upon evaluation today patient appears to be doing decently well in regard to his foot in general. He did have an MRI I did review this and he had multiple findings on MRI consistent with osteomyelitis across the board in regard pretty much to  his remaining metatarsal regions. Nonetheless there was also some evidence of some involvement of the navicular bone as well. Either way I think that this patient actually is in desperate need of get him in for hyperbaric oxygen therapy we discussed that today that will be detailed in the plan. Nonetheless he is also still on the doxycycline which I think is appropriate for the time being as well I did go ahead and place him on that following the results of the MRI based on what we were seeing. I gave him that prescription for 14 days for the time being and we will go from there. Electronic Signature(s) Signed: 11/26/2020 1:34:38 PM By: Worthy Keeler PA-C Entered By: Worthy Keeler on 11/26/2020 13:34:38 -------------------------------------------------------------------------------- Physical Exam Details Patient Name: Date of Service: [name], [name] 11/26/2020 11:00 A M Medical Record Number: [id number] Patient Account Number: [id number] Date of Birth/Sex: Treating RN: Jan 31, 1971 (50 y.o. [name] Primary Care Provider: [name] Other Clinician: Referring Provider: Treating Provider/Extender: Dorris Singh, Zelda Weeks in Treatment: 15 Constitutional Well-nourished and well-hydrated in no acute distress. Respiratory normal breathing without difficulty. Psychiatric this patient is able to make decisions and demonstrates good insight into disease process. Alert and Oriented x 3. pleasant and cooperative. Notes Upon inspection patient's wound bed actually showed signs of doing somewhat better in a couple areas he still has some fairly deep ulcerations and others. Nonetheless coupled with the significant osteomyelitis internally I feel like that hyperbaric oxygen therapy would be appropriate for this patient and we need to see what we can do about getting this moving in the appropriate direction. I think that the biggest thing is that we need to get insurance  approval and is soon as we can  we get him started. Electronic Signature(s) Signed: 11/26/2020 1:35:27 PM By: Worthy Keeler PA-C Previous Signature: 11/26/2020 1:35:11 PM Version By: Worthy Keeler PA-C Entered By: Worthy Keeler on 11/26/2020 13:35:27 -------------------------------------------------------------------------------- Physician Orders Details Patient Name: Date of Service: [name], [place] 11/26/2020 11:00 A M Medical Record Number: [id number] Patient Account Number: [id number] Date of Birth/Sex: Treating RN: 09/11/1970 (50 y.o. [name] Primary Care Provider: [name] Other Clinician: Referring Provider: Treating Provider/Extender: Lenox Ponds Weeks in Treatment: [phone] Verbal / Phone Orders: No Diagnosis Coding ICD-10 Coding Code Description E11.621 Type 2 diabetes mellitus with foot ulcer L97.514 Non-pressure chronic ulcer of other part of right foot with necrosis of bone T81.31XS Disruption of external operation (surgical) wound, not elsewhere classified, sequela L97.528 Non-pressure chronic ulcer of other part of left foot with other specified severity Follow-up Appointments ppointment in 1 week. - with Margarita Grizzle Return A Bathing/ Shower/ Hygiene May shower and wash wound with soap and water. - on days you change dressing Edema Control - Lymphedema / SCD / Other Elevate legs to the level of the heart or above for 30 minutes daily and/or when sitting, a frequency of: Avoid standing for long periods of time. Moisturize legs daily. - to foot with dressing changes Off-Loading Open toe surgical shoe to: - Wear at all times except driving. on right foot Other: - may wear shoe with memory foam insert to left foot Additional Orders / Instructions Stop/Decrease Smoking Follow Nutritious Diet Non Wound Condition Other Non Wound Condition Orders/Instructions: - cushion bottom of left foot with orthopedic felt or foam [place] to  [place] for wound care. May utilize formulary equivalent dressing for wound treatment orders unless otherwise specified. Dressing changes to be completed by [place] on Monday / Wednesday / Friday except when patient has scheduled visit at [facility]. Hyperbaric Oxygen Therapy Indication: - wagner grade 3 diabetic foot ulcer of right foot If appropriate for treatment, begin HBOT per protocol: 2.5 ATA for 90 Minutes with 2 Five (5) Minute A Breaks ir Total Number of Treatments: - 40 One treatments per day (delivered Monday through Friday unless otherwise specified in Special Instructions below): Finger stick Blood Glucose Pre- and Post- HBOT Treatment. Follow Hyperbaric Oxygen Glycemia Protocol Afrin (Oxymetazoline HCL) 0.05% nasal spray - 1 spray in both nostrils daily as needed prior to HBO treatment for difficulty clearing ears Wound Treatment Wound #1 - Amputation Site - Transmetatarsal Wound Laterality: Right Cleanser: Soap and Water [facility]) Every Other Day/30 Days Discharge Instructions: May shower and wash wound with dial antibacterial soap and water prior to dressing change. Cleanser: Wound Cleanser Every Other Day/30 Days Discharge Instructions: Cleanse the wound with wound cleanser prior to applying a clean dressing using gauze sponges, not tissue or cotton balls. Peri-Wound Care: Sween Lotion (Moisturizing lotion) Every Other Day/30 Days Discharge Instructions: Apply moisturizing lotion to foot with dressing changes Prim Dressing: KerraCel Ag Gelling Fiber Dressing, 4x5 in (silver alginate) (Home Health) Every Other Day/30 Days ary Discharge Instructions: Pack into any undermined area Secondary Dressing: Woven Gauze Sponge, Non-Sterile 4x4 in [place]) Every Other Day/30 Days Discharge Instructions: Apply over primary dressing as directed. Secondary Dressing: ABD Pad, 5x9 [facility]) Every Other Day/30 Days Discharge Instructions: Apply over primary  dressing as directed. Secured With: [employer], 4.5x3.1 (in/yd) [facility]) Every Other Day/30 Days Discharge Instructions: Secure with Kerlix as directed. Secured With: Transpore Surgical Tape, 2x10 (in/yd) (Home  Health) Every Other Day/30 Days Discharge Instructions: Secure dressing with tape as directed. Wound #3 - Foot Wound Laterality: Right, Lateral, Proximal Cleanser: Soap and Water [facility]) Every Other Day/30 Days Discharge Instructions: May shower and wash wound with dial antibacterial soap and water prior to dressing change. Cleanser: Wound Cleanser Every Other Day/30 Days Discharge Instructions: Cleanse the wound with wound cleanser prior to applying a clean dressing using gauze sponges, not tissue or cotton balls. Peri-Wound Care: Sween Lotion (Moisturizing lotion) Every Other Day/30 Days Discharge Instructions: Apply moisturizing lotion to foot with dressing changes Prim Dressing: KerraCel Ag Gelling Fiber Dressing, 4x5 in (silver alginate) (Home Health) Every Other Day/30 Days ary Discharge Instructions: Pack into any undermined area Secondary Dressing: Woven Gauze Sponge, Non-Sterile 4x4 in [facility]) Every Other Day/30 Days Discharge Instructions: Apply over primary dressing as directed. Secondary Dressing: ABD Pad, 5x9 [facility]) Every Other Day/30 Days Discharge Instructions: Apply over primary dressing as directed. Secured With: [employer], 4.5x3.1 (in/yd) [facility]) Every Other Day/30 Days Discharge Instructions: Secure with Kerlix as directed. Secured With: Transpore Surgical Tape, 2x10 (in/yd) [facility]) Every Other Day/30 Days Discharge Instructions: Secure dressing with tape as directed. GLYCEMIA INTERVENTIONS PROTOCOL PRE-HBO GLYCEMIA INTERVENTIONS ACTION INTERVENTION Obtain pre-HBO capillary blood glucose (ensure 1 physician order is in chart). A. Notify HBO physician and await physician orders. 2 If result is 70 mg/dl or  below: B. If the result meets the hospital definition of a critical result, follow hospital policy. A. Give patient an 8 ounce Glucerna Shake, an 8 ounce Ensure, or 8 ounces of a Glucerna/Ensure equivalent dietary supplement*. B. Wait 30 minutes. If result is 71 mg/dl to 130 mg/dl: C. Retest patients capillary blood glucose (CBG). D. If result greater than or equal to 110 mg/dl, proceed with HBO. If result less than 110 mg/dl, notify HBO physician and consider holding HBO. If result is 131 mg/dl to 249 mg/dl: A. Proceed with HBO. A. Notify HBO physician and await physician orders. B. It is recommended to hold HBO and do If result is 250 mg/dl or greater: blood/urine ketone testing. C. If the result meets the hospital definition of a critical result, follow hospital policy. POST-HBO GLYCEMIA INTERVENTIONS ACTION INTERVENTION Obtain post HBO capillary blood glucose (ensure 1 physician order is in chart). A. Notify HBO physician and await physician orders. 2 If result is 70 mg/dl or below: B. If the result meets the hospital definition of a critical result, follow hospital policy. A. Give patient an 8 ounce Glucerna Shake, an 8 ounce Ensure, or 8 ounces of a Glucerna/Ensure equivalent dietary supplement*. B. Wait 15 minutes for symptoms of If result is 71 mg/dl to 100 mg/dl: hypoglycemia (i.e. nervousness, anxiety, sweating, chills, clamminess, irritability, confusion, tachycardia or dizziness). C. If patient asymptomatic, discharge patient. If patient symptomatic, repeat capillary blood glucose (CBG) and notify HBO physician. If result is 101 mg/dl to 249 mg/dl: A. Discharge patient. A. Notify HBO physician and await physician orders. B. It is recommended to do blood/urine ketone If result is 250 mg/dl or greater: testing. C. If the result meets the hospital definition of a critical result, follow hospital policy. *Juice or candies are NOT equivalent products.  If patient refuses the Glucerna or Ensure, please consult the hospital dietitian for an appropriate substitute. Electronic Signature(s) Signed: 11/28/2020 12:11:16 PM By: Baruch Gouty RN, BSN Signed: 11/28/2020 2:25:54 PM By: Worthy Keeler PA-C Previous Signature: 11/26/2020 4:49:46 PM Version By: Baruch Gouty RN, BSN Previous Signature: 11/26/2020  5:21:16 PM Version By: Worthy Keeler PA-C Entered By: Baruch Gouty on 11/28/2020 11:43:31 -------------------------------------------------------------------------------- Problem List Details Patient Name: Date of Service: [name], [name] 11/26/2020 11:00 A M Medical Record Number: [id number] Patient Account Number: [id number] Date of Birth/Sex: Treating RN: Feb 20, 1971 (50 y.o. [name], [name] Primary Care Provider: [name] Other Clinician: Referring Provider: Treating Provider/Extender: Lenox Ponds Weeks in Treatment: 11 Active Problems ICD-10 Encounter Code Description Active Date MDM Diagnosis E11.621 Type 2 diabetes mellitus with foot ulcer 09/05/2020 No Yes L97.514 Non-pressure chronic ulcer of other part of right foot with necrosis of bone 09/05/2020 No Yes T81.31XS Disruption of external operation (surgical) wound, not elsewhere classified, 09/05/2020 No Yes sequela L97.528 Non-pressure chronic ulcer of other part of left foot with other specified 10/22/2020 No Yes severity F17.218 Nicotine dependence, cigarettes, with other nicotine-induced disorders 11/26/2020 No Yes Inactive Problems ICD-10 Code Description Active Date Inactive Date M86.671 Other chronic osteomyelitis, right ankle and foot 09/05/2020 09/05/2020 Resolved Problems Electronic Signature(s) Signed: 11/26/2020 1:37:53 PM By: Worthy Keeler PA-C Previous Signature: 11/26/2020 11:23:21 AM Version By: Worthy Keeler PA-C Entered By: Worthy Keeler on 11/26/2020  13:37:52 -------------------------------------------------------------------------------- Progress Note Details Patient Name: Date of Service: [name], [name] 11/26/2020 11:00 A M Medical Record Number: [id number] Patient Account Number: [id number] Date of Birth/Sex: Treating RN: 12-12-1970 (50 y.o. [name] Primary Care Provider: [name] Other Clinician: Referring Provider: Treating Provider/Extender: Lenox Ponds Weeks in Treatment: 11 Subjective Chief Complaint Information obtained from Patient 09/05/2020; patient is here for review of the wound extensively on a right TMA amputation site History of Present Illness (HPI) ADMISSION 09/05/2020 This is a 50 year old man who has type 2 diabetes. Essentially the problem began admitted to [facility] health in December with infection gas gangrene. Cultures ultimately grew staph lugdunensis. 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 March Rummage. Shortly thereafter he developed a nonhealing wound with wound dehiscence noted on 05/09/2020. He had a wound VAC I think for a period of time after the surgery but it did not help. He has undergone an IandD and skin graft on the right foot on 05/16/2020. He underwent a further IandD on 06/04/2020 not debrided to bone. 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 Culture of the metatarsal grew Enterobacter cloacae I. He has been followed by Dr. [name] of infectious disease. Currently on Levaquin and doxycycline I think at about the 4-week of 6 mark. At the last visit with Dr. March Rummage he was supposed to have use Santyl although I do not think he has it and he has been using I think a Betadine wet-to-dry. He is referred here by Dr. [name] for our review who saw him yesterday. I cannot see that he has had imaging studies of the right foot. No MRI Past medical history includes type  2 diabetes, in 2021 and ulcer of the left foot that ultimately healed with a wound VAC, stage III chronic kidney disease, first toe amputations bilaterally, hypertension, still a 1/2 pack/day smoker. The patient had ABIs on 08/09/2018 which was 1.00 on the right and 0.96 on the left both areas had triphasic waveforms. Not felt to have an arterial issue. 09/12/2020; 1 week follow-up. He has been doing the dressings along with home health. This is a deep punched out area in the incision of his transmit site. We use  silver alginate last week to help with drainage and antibacterial properties. He tolerated this well 6/24; the patient was actually at his podiatrist this morning who debrided the wound. We have been using silver alginate. They are going to follow him in 6 weeks. 6/29; patient is using silver alginate. Apparently saw Dr. March Rummage this week although I have not checked his note I will try to do so. He is following up in 6 weeks. This was the original transmetatarsal amputation when I first saw this 3 mid part of his amputation site was deep down to bone however this seems to have progressively close down which is gratifying to see. He still has a fairly callus uneven surface however spreading this apart its difficult to identify anything that does not look epithelialized. When I first saw this I thought he would require an extensive debridement perhaps back in the OR by Dr. March Rummage however all of this appears to be looking better and at this point I would continue with the silver alginate see if this holds together over the next several weeks. He does not have an arterial issue 7/13; 2-week follow-up. Unfortunately the area did not hold together there was drainage on his dressing. Still copious amounts of callus. It was difficult to see where this actually was open therefore I elected to go ahead with a vigorous debridement. Still using silver alginate 7/20 I brought him back after extensive callus  debridement last week. The only open area was on the medial part of the foot extensive debridement of thick surrounding callus and I was able to do identify the remaining tunneling area. 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 This is the area that we may have been following. This is smaller this week but still surrounded with thick callus He came in today with a large area of callus on the left midfoot. This I had noticed previously. HOWEVER our intake nurse clearly incorrectly documented this as opening. The callus and split there was an odor so all of this had to be removed. 10/29/2020 unfortunately upon evaluation today this patient appears to have significant mount of callus noted at this point. There does not appear to be any signs of active infection systemically which is great although locally he is having some discomfort around the lateral portion of his foot on the right. The left foot is no having a lot of callus I am not certain even has an open wound if there is anything is extremely tiny. 11/05/2020 upon evaluation today patient's wounds actually appear to be doing a bit better compared to last week since we have uncovered some of these areas I think there are some definite improvements. With that being said there is still quite a bit of tunneling and undermining at many locations that again has me concerned I still think that the MRI is the best thing to do he has that scheduled for Saturday. 11/26/2020 upon evaluation today patient appears to be doing decently well in regard to his foot in general. He did have an MRI I did review this and he had multiple findings on MRI consistent with osteomyelitis across the board in regard pretty much to his remaining metatarsal regions. Nonetheless there was also some evidence of some involvement of the navicular bone as well. Either way I think that this patient actually is in desperate  need of get him in for hyperbaric oxygen therapy we discussed  that today that will be detailed in the plan. Nonetheless he is also still on the doxycycline which I think is appropriate for the time being as well I did go ahead and place him on that following the results of the MRI based on what we were seeing. I gave him that prescription for 14 days for the time being and we will go from there. Objective Constitutional Well-nourished and well-hydrated in no acute distress. Vitals Time Taken: 11:06 AM, Height: 64 in, Source: Stated, Weight: 188 lbs, Source: Stated, BMI: 32.3, Temperature: 97.9 F, Pulse: 87 bpm, Respiratory Rate: 18 breaths/min, Blood Pressure: 153/88 mmHg, Capillary Blood Glucose: 170 mg/dl. General Notes: glucose per pt report this am Respiratory normal breathing without difficulty. Psychiatric this patient is able to make decisions and demonstrates good insight into disease process. Alert and Oriented x 3. pleasant and cooperative. General Notes: Upon inspection patient's wound bed actually showed signs of doing somewhat better in a couple areas he still has some fairly deep ulcerations and others. Nonetheless coupled with the significant osteomyelitis internally I feel like that hyperbaric oxygen therapy would be appropriate for this patient and we need to see what we can do about getting this moving in the appropriate direction. I think that the biggest thing is that we need to get insurance approval and is soon as we can we get him started. Integumentary (Hair, Skin) Wound #1 status is Open. Original cause of wound was Surgical Injury. The date acquired was: 02/28/2020. The wound has been in treatment 11 weeks. The wound is located on the Right Amputation Site - Transmetatarsal. The wound measures 0cm length x 0cm width x 0cm depth; 0cm^2 area and 0cm^3 volume. There is no tunneling or undermining noted. There is a none present amount of drainage noted. There is no  granulation within the wound bed. There is no necrotic tissue within the wound bed. Wound #3 status is Open. Original cause of wound was Gradually Appeared. The date acquired was: 10/29/2020. The wound has been in treatment 4 weeks. The wound is located on the Right,Proximal,Lateral Foot. The wound measures 0.4cm length x 0.3cm width x 1.4cm depth; 0.094cm^2 area and 0.132cm^3 volume. There is Fat Layer (Subcutaneous Tissue) exposed. There is no tunneling noted, however, there is undermining starting at 12:00 and ending at 12:00 with a maximum distance of 0.4cm. There is a medium amount of serosanguineous drainage noted. The wound margin is thickened. There is large (67-100%) red granulation within the wound bed. There is no necrotic tissue within the wound bed. Wound #4 status is Healed - Epithelialized. Original cause of wound was Gradually Appeared. The date acquired was: 10/29/2020. The wound has been in treatment 4 weeks. The wound is located on the Right,Distal,Lateral Foot. The wound measures 0cm length x 0cm width x 0cm depth; 0cm^2 area and 0cm^3 volume. There is no tunneling or undermining noted. There is a none present amount of drainage noted. There is no granulation within the wound bed. There is no necrotic tissue within the wound bed. Wound #5 status is Open. Original cause of wound was Gradually Appeared. The date acquired was: 10/29/2020. The wound has been in treatment 3 weeks. The wound is located on the [place]. The wound measures 0cm length x 0cm width x 0cm depth; 0cm^2 area and 0cm^3 volume. There is no tunneling or undermining noted. There is a none present amount of drainage noted. There is no granulation within the wound bed. There is no necrotic tissue within the  wound bed. Assessment Active Problems ICD-10 Type 2 diabetes mellitus with foot ulcer Non-pressure chronic ulcer of other part of right foot with necrosis of bone Disruption of external operation  (surgical) wound, not elsewhere classified, sequela Non-pressure chronic ulcer of other part of left foot with other specified severity Nicotine dependence, cigarettes, with other nicotine-induced disorders Procedures Wound #1 Pre-procedure diagnosis of Wound #1 is a Diabetic Wound/Ulcer of the Lower Extremity located on the Right Amputation Site - Transmetatarsal .Severity of Tissue Pre Debridement is: Fat layer exposed. There was a Selective/Open Wound Skin/Epidermis Debridement with a total area of 9 sq cm performed by Worthy Keeler, PA. With the following instrument(s): Curette to remove Non-Viable tissue/material. Material removed includes Callus and Skin: Epidermis and. No specimens were taken. A time out was conducted at 12:00, prior to the start of the procedure. There was no bleeding. The procedure was tolerated well with a pain level of 0 throughout and a pain level of 0 following the procedure. Post Debridement Measurements: 0.1cm length x 0.1cm width x 0.1cm depth; 0.001cm^3 volume. Character of Wound/Ulcer Post Debridement is improved. Severity of Tissue Post Debridement is: Fat layer exposed. Post procedure Diagnosis Wound #1: Same as Pre-Procedure Wound #3 Pre-procedure diagnosis of Wound #3 is a Diabetic Wound/Ulcer of the Lower Extremity located on the Right,Proximal,Lateral Foot .Severity of Tissue Pre Debridement is: Fat layer exposed. There was a Excisional Skin/Subcutaneous Tissue Debridement with a total area of 0.4 sq cm performed by Worthy Keeler, PA. With the following instrument(s): Curette to remove Viable and Non-Viable tissue/material. Material removed includes Callus, Subcutaneous Tissue, Slough, and Skin: Epidermis. No specimens were taken. A time out was conducted at 12:00, prior to the start of the procedure. A Minimum amount of bleeding was controlled with Pressure. The procedure was tolerated well with a pain level of 0 throughout and a pain level of 0  following the procedure. Post Debridement Measurements: 0.5cm length x 0.5cm width x 1.4cm depth; 0.275cm^3 volume. Character of Wound/Ulcer Post Debridement is improved. Severity of Tissue Post Debridement is: Fat layer exposed. Post procedure Diagnosis Wound #3: Same as Pre-Procedure Plan Follow-up Appointments: Return Appointment in 1 week. - with Glynn Octave Shower/ Hygiene: May shower and wash wound with soap and water. - on days you change dressing Edema Control - Lymphedema / SCD / Other: Elevate legs to the level of the heart or above for 30 minutes daily and/or when sitting, a frequency of: Avoid standing for long periods of time. Moisturize legs daily. - to foot with dressing changes Off-Loading: Open toe surgical shoe to: - Wear at all times except driving. on right foot Other: - may wear shoe with memory foam insert to left foot Additional Orders / Instructions: Stop/Decrease Smoking Follow Nutritious Diet Non Wound Condition: Other Non Wound Condition Orders/Instructions: - cushion bottom of left foot with orthopedic felt or foam Home Health: No change in wound care orders this week; continue Home Health for wound care. May utilize formulary equivalent dressing for wound treatment orders unless otherwise specified. Other Home Health Orders/Instructions: - [facility] Hyperbaric Oxygen Therapy: Indication: - wagner grade 3 diabetic foot ulcer of right foot If appropriate for treatment, begin HBOT per protocol: 2.5 ATA for 90 Minutes with 2 Five (5) Minute Air Breaks T Number of Treatments: - 40 otal One treatments per day (delivered Monday through Friday unless otherwise specified in Special Instructions below): Finger stick Blood Glucose Pre- and Post- HBOT Treatment. Follow Hyperbaric Oxygen Glycemia Protocol  Afrin (Oxymetazoline HCL) 0.05% nasal spray - 1 spray in both nostrils daily as needed prior to HBO treatment for difficulty clearing ears WOUND #1: -  Amputation Site - Transmetatarsal Wound Laterality: Right Cleanser: Soap and Water [facility]) Every Other Day/30 Days Discharge Instructions: May shower and wash wound with dial antibacterial soap and water prior to dressing change. Cleanser: Wound Cleanser Every Other Day/30 Days Discharge Instructions: Cleanse the wound with wound cleanser prior to applying a clean dressing using gauze sponges, not tissue or cotton balls. Peri-Wound Care: Sween Lotion (Moisturizing lotion) Every Other Day/30 Days Discharge Instructions: Apply moisturizing lotion to foot with dressing changes Prim Dressing: KerraCel Ag Gelling Fiber Dressing, 4x5 in (silver alginate) (Home Health) Every Other Day/30 Days ary Discharge Instructions: Pack into any undermined area Secondary Dressing: Woven Gauze Sponge, Non-Sterile 4x4 in [facility]) Every Other Day/30 Days Discharge Instructions: Apply over primary dressing as directed. Secondary Dressing: ABD Pad, 5x9 [facility]) Every Other Day/30 Days Discharge Instructions: Apply over primary dressing as directed. Secured With: [employer], 4.5x3.1 (in/yd) [facility]) Every Other Day/30 Days Discharge Instructions: Secure with Kerlix as directed. Secured With: [employer] T ape, 2x10 (in/yd) (Home Health) Every Other Day/30 Days Discharge Instructions: Secure dressing with tape as directed. WOUND #3: - Foot Wound Laterality: Right, Lateral, Proximal Cleanser: Soap and Water [facility]) Every Other Day/30 Days Discharge Instructions: May shower and wash wound with dial antibacterial soap and water prior to dressing change. Cleanser: Wound Cleanser Every Other Day/30 Days Discharge Instructions: Cleanse the wound with wound cleanser prior to applying a clean dressing using gauze sponges, not tissue or cotton balls. Peri-Wound Care: Sween Lotion (Moisturizing lotion) Every Other Day/30 Days Discharge Instructions: Apply moisturizing lotion to foot  with dressing changes Prim Dressing: KerraCel Ag Gelling Fiber Dressing, 4x5 in (silver alginate) (Home Health) Every Other Day/30 Days ary Discharge Instructions: Pack into any undermined area Secondary Dressing: Woven Gauze Sponge, Non-Sterile 4x4 in [facility]) Every Other Day/30 Days Discharge Instructions: Apply over primary dressing as directed. Secondary Dressing: ABD Pad, 5x9 [facility]) Every Other Day/30 Days Discharge Instructions: Apply over primary dressing as directed. Secured With: [employer], 4.5x3.1 (in/yd) [facility]) Every Other Day/30 Days Discharge Instructions: Secure with Kerlix as directed. Secured With: [employer] T ape, 2x10 (in/yd) (Home Health) Every Other Day/30 Days Discharge Instructions: Secure dressing with tape as directed. 1. Would recommend at this point in the meantime that we go ahead and continue to utilize the wound care measures as before. I do think that this is obviously making some improvement in general and still good to be the appropriate way to go. This includes the use of the silver alginate dressing. 2. I am also can recommend that we going continue with the ABD pad to cover followed by roll gauze to secure in place. 3. I am also can recommend that we should have the patient continue with the postop offloading shoe which I think is also beneficial for him. 4. I am good to go and place the order for hyperbaric oxygen therapy. I think we should diam at 2.5 ATA for a total of 40 treatments and again this will follow the diabetic protocol as far as his blood sugars are concerned. The soonest we can gain insurance approval we will get this started he has had a fairly recent x- ray which was negative he has also had a fairly decently recent EKG which was negative he has no heart disease  he is a current smoker. 5. I did have a fairly lengthy conversation with him today lasted about 5 minutes in regard to smoking cessation and how  important it is can be as far as wound healing is concerned. I told him before to go as far as hyperbarics I really do want him to stop smoking as well he tells me that he is good to do that he is also can work on his blood sugar control. We will see patient back for reevaluation in 1 week here in the clinic. If anything worsens or changes patient will contact our office for additional recommendations. Electronic Signature(s) Signed: 11/26/2020 1:38:07 PM By: Worthy Keeler PA-C Previous Signature: 11/26/2020 1:36:57 PM Version By: Worthy Keeler PA-C Entered By: Worthy Keeler on 11/26/2020 13:38:07 -------------------------------------------------------------------------------- SuperBill Details Patient Name: Date of Service: [name], [name] 11/26/2020 Medical Record Number: [id number] Patient Account Number: [id number] Date of Birth/Sex: Treating RN: 08/15/1970 (50 y.o. [name] Primary Care Provider: [name] Other Clinician: Referring Provider: Treating Provider/Extender: Lenox Ponds Weeks in Treatment: 11 Diagnosis Coding ICD-10 Codes Code Description E11.621 Type 2 diabetes mellitus with foot ulcer L97.514 Non-pressure chronic ulcer of other part of right foot with necrosis of bone T81.31XS Disruption of external operation (surgical) wound, not elsewhere classified, sequela L97.528 Non-pressure chronic ulcer of other part of left foot with other specified severity F17.218 Nicotine dependence, cigarettes, with other nicotine-induced disorders Facility Procedures CPT4 Code: 33545625 Description: 63893 - DEB SUBQ TISSUE 20 SQ CM/< ICD-10 Diagnosis Description L97.514 Non-pressure chronic ulcer of other part of right foot with necrosis of bone Modifier: Quantity: 1 CPT4 Code: 73428768 Description: 11572 - DEBRIDE WOUND 1ST 20 SQ CM OR < ICD-10 Diagnosis Description L97.514 Non-pressure chronic ulcer of other part of right foot with necrosis  of bone Modifier: Quantity: 1 CPT4 Code: 62035597 Description: 99406-SMOKING CESSATION 3-10MINS ICD-10 Diagnosis Description F17.218 Nicotine dependence, cigarettes, with other nicotine-induced disorders Modifier: Quantity: 1 Physician Procedures : CPT4 Code Description Modifier [id number] 36468 - WC PHYS LEVEL 4 - EST PT 25 ICD-10 Diagnosis Description E11.621 Type 2 diabetes mellitus with foot ulcer L97.514 Non-pressure chronic ulcer of other part of right foot with necrosis of bone T81.31XS  Disruption of external operation (surgical) wound, not elsewhere classified, sequela L97.528 Non-pressure chronic ulcer of other part of left foot with other specified severity [id number] 11042 - WC PHYS SUBQ TISS 20 SQ CM 1 ICD-10 Diagnosis Description  L97.514 Non-pressure chronic ulcer of other part of right foot with necrosis of bone Quantity: 1 : [id number] 04888 - WC PHYS DEBR WO ANESTH 20 SQ CM 1 ICD-10 Diagnosis Description L97.514 Non-pressure chronic ulcer of other part of right foot with necrosis of bone Quantity: : 99406 99406- SMOKING CESSATION 3-10 MINS 1 ICD-10 Diagnosis Description F17.218 Nicotine dependence, cigarettes, with other nicotine-induced disorders Quantity: Electronic Signature(s) Signed: 11/26/2020 1:38:21 PM By: Worthy Keeler PA-C Previous Signature: 11/26/2020 1:37:33 PM Version By: Worthy Keeler PA-C Entered By: Worthy Keeler on 11/26/2020 13:38:21

## 2020-12-02 ENCOUNTER — Other Ambulatory Visit: Payer: Self-pay

## 2020-12-03 ENCOUNTER — Other Ambulatory Visit: Payer: Self-pay

## 2020-12-03 ENCOUNTER — Encounter (HOSPITAL_BASED_OUTPATIENT_CLINIC_OR_DEPARTMENT_OTHER): Payer: 59 | Attending: Physician Assistant | Admitting: Physician Assistant

## 2020-12-03 DIAGNOSIS — F17218 Nicotine dependence, cigarettes, with other nicotine-induced disorders: Secondary | ICD-10-CM | POA: Insufficient documentation

## 2020-12-03 DIAGNOSIS — L97514 Non-pressure chronic ulcer of other part of right foot with necrosis of bone: Secondary | ICD-10-CM | POA: Diagnosis not present

## 2020-12-03 DIAGNOSIS — E11621 Type 2 diabetes mellitus with foot ulcer: Secondary | ICD-10-CM | POA: Diagnosis present

## 2020-12-03 DIAGNOSIS — L97528 Non-pressure chronic ulcer of other part of left foot with other specified severity: Secondary | ICD-10-CM | POA: Diagnosis not present

## 2020-12-03 NOTE — Progress Notes (Addendum)
[name], [name] ([id number]) Visit Report for 12/03/2020 Chief Complaint Document Details Patient Name: Date of Service: [name], [name] Joseph J. 12/03/2020 8:45 A M Medical Record Number: [id number] Patient Account Number: [id number] Date of Birth/Sex: [name]: 1970/10/26 (50 y.o. [name] Primary Care Provider: Durene [name] Other Clinician: Referring Provider: Treating Provider/Extender: [name]: 12 Information Obtained from: Patient Chief Complaint 09/05/2020; patient is here for review of the wound extensively on a right TMA amputation site. He also has a wound on the plantar aspect of the left foot Electronic Signature(s) Signed: 12/03/2020 9:32:47 AM By: Joseph Keeler PA-C Previous Signature: 12/03/2020 8:45:50 AM Version By: Joseph Keeler PA-C Entered By: [name] on 12/03/2020 09:32:47 -------------------------------------------------------------------------------- Debridement Details Patient Name: Date of Service: Joseph RD, Joseph Joseph J. 12/03/2020 8:45 A M Medical Record Number: [id number] Patient Account Number: [id number] Date of Birth/Sex: [name]: [birth year] (50 y.o. [name] Primary Care Provider: Durene [name] Other Clinician: Referring Provider: Treating Provider/Extender: [name]: 12 Debridement Performed for Assessment: Wound #6 Left,Plantar Foot Performed By: Physician Joseph Keeler, PA Debridement Type: Debridement Severity of Tissue Pre Debridement: Fat layer exposed Level of Consciousness (Pre-procedure): Awake and Alert Pre-procedure Verification/Time Out Yes - 09:10 Taken: Start Time: 09:10 T Area Debrided (L x W): otal 6.5 (cm) x 5.5 (cm) = 35.75 (cm) Tissue and other material debrided: Viable, Non-Viable, Callus, Slough, Subcutaneous, Skin: Epidermis, Slough Level: Skin/Subcutaneous Tissue Debridement Description: Excisional Instrument:  Curette Bleeding: Minimum Hemostasis Achieved: Pressure Procedural Pain: 0 Post Procedural Pain: 0 Response to [name]: Procedure was tolerated well Level of Consciousness (Post- Awake and Alert procedure): Post Debridement Measurements of Total Wound Length: (cm) 0.8 Width: (cm) 0.8 Depth: (cm) 0.4 Volume: (cm) 0.201 Character of Wound/Ulcer Post Debridement: Improved Severity of Tissue Post Debridement: Fat layer exposed Post Procedure Diagnosis Same as Pre-procedure Electronic Signature(s) Signed: 12/03/2020 6:00:14 PM By: Joseph Keeler PA-C Signed: 12/03/2020 6:09:03 PM By: Joseph Gouty [name], BSN Entered By: [name] on 12/03/2020 09:20:13 -------------------------------------------------------------------------------- Debridement Details Patient Name: Date of Service: Joseph Hahn RD, Joseph Joseph J. 12/03/2020 8:45 A M Medical Record Number: [id number] Patient Account Number: [id number] Date of Birth/Sex: [name]: 1970-10-11 (50 y.o. [name] Primary Care Provider: Durene [name] Other Clinician: Referring Provider: Treating Provider/Extender: [name]: 12 Debridement Performed for Assessment: Wound #3 Right,Proximal,Lateral Foot Performed By: Physician Joseph Keeler, PA Debridement Type: Debridement Severity of Tissue Pre Debridement: Fat layer exposed Level of Consciousness (Pre-procedure): Awake and Alert Pre-procedure Verification/Time Out Yes - 09:10 Taken: Start Time: 09:10 T Area Debrided (L x W): otal 0.8 (cm) x 0.4 (cm) = 0.32 (cm) Tissue and other material debrided: Viable, Non-Viable, Callus, Slough, Subcutaneous, Skin: Epidermis, Slough Level: Skin/Subcutaneous Tissue Debridement Description: Excisional Instrument: Curette Bleeding: Minimum Hemostasis Achieved: Pressure Procedural Pain: 0 Post Procedural Pain: 0 Response to [name]: Procedure was tolerated well Level of Consciousness (Post-  Awake and Alert procedure): Post Debridement Measurements of Total Wound Length: (cm) 0.8 Width: (cm) 0.4 Depth: (cm) 0.9 Volume: (cm) 0.226 Character of Wound/Ulcer Post Debridement: Improved Severity of Tissue Post Debridement: Fat layer exposed Post Procedure Diagnosis Same as Pre-procedure Electronic Signature(s) Signed: 12/03/2020 6:00:14 PM By: Joseph Keeler PA-C Signed: 12/03/2020 6:09:03 PM By: Joseph Gouty [name], BSN Entered By: [name] on 12/03/2020 09:22:01 -------------------------------------------------------------------------------- HPI Details Patient Name: Date of Service: Joseph RD, Joseph Joseph J. 12/03/2020  8:45 A M Medical Record Number: [id number] Patient Account Number: [id number] Date of Birth/Sex: [name]: [birth year] (50 y.o. [name] Primary Care Provider: Durene [name] Other Clinician: Referring Provider: Treating Provider/Extender: [name]: 12 History of Present Illness HPI Description: ADMISSION 09/05/2020 This is a 50 year old man who has type 2 diabetes. Essentially the problem began admitted to [facility] health in December with infection gas gangrene. Cultures ultimately grew staph lugdunensis. 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 March Rummage. Shortly thereafter he developed a nonhealing wound with wound dehiscence noted on 05/09/2020. He had a wound VAC I think for a period of time after the surgery but it did not help. He has undergone an IandD and skin graft on the right foot on 05/16/2020. He underwent a further IandD on 06/04/2020 not debrided to bone. 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 Culture of the metatarsal grew Enterobacter cloacae I. He has been followed by Dr. [name] of infectious disease. Currently on Levaquin and doxycycline I think at about the 4-week of 6 mark. At the  last visit with Dr. March Rummage he was supposed to have use Santyl although I do not think he has it and he has been using I think a Betadine wet-to-dry. He is referred here by Dr. [name] for our review who saw him yesterday. I cannot see that he has had imaging studies of the right foot. No MRI Past medical history includes type 2 diabetes, in 2021 and ulcer of the left foot that ultimately healed with a wound VAC, stage III chronic kidney disease, first toe amputations bilaterally, hypertension, still a 1/2 pack/day smoker. The patient had ABIs on 08/09/2018 which was 1.00 on the right and 0.96 on the left both areas had triphasic waveforms. Not felt to have an arterial issue. 09/12/2020; 1 week follow-up. He has been doing the dressings along with home health. This is a deep punched out area in the incision of his transmit site. We use silver alginate last week to help with drainage and antibacterial properties. He tolerated this well 6/24; the patient was actually at his podiatrist this morning who debrided the wound. We have been using silver alginate. They are going to follow him in 6 weeks. 6/29; patient is using silver alginate. Apparently saw Dr. March Rummage this week although I have not checked his note I will try to do so. He is following up in 6 weeks. This was the original transmetatarsal amputation when I first saw this 3 mid part of his amputation site was deep down to bone however this seems to have progressively close down which is gratifying to see. He still has a fairly callus uneven surface however spreading this apart its difficult to identify anything that does not look epithelialized. When I first saw this I thought he would require an extensive debridement perhaps back in the OR by Dr. March Rummage however all of this appears to be looking better and at this point I would continue with the silver alginate see if this holds together over the next several weeks. He does not have an arterial issue 7/13;  2-week follow-up. Unfortunately the area did not hold together there was drainage on his dressing. Still copious amounts of callus. It was difficult to see where this actually was open therefore I elected to go ahead with a vigorous debridement. Still using silver alginate 7/20 I brought  him back after extensive callus debridement last week. The only open area was on the medial part of the foot extensive debridement of thick surrounding callus and I was able to do identify the remaining tunneling area. 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 This is the area that we may have been following. This is smaller this week but still surrounded with thick callus He came in today with a large area of callus on the left midfoot. This I had noticed previously. HOWEVER our intake nurse clearly incorrectly documented this as opening. The callus and split there was an odor so all of this had to be removed. 10/29/2020 unfortunately upon evaluation today this patient appears to have significant mount of callus noted at this point. There does not appear to be any signs of active infection systemically which is great although locally he is having some discomfort around the lateral portion of his foot on the right. The left foot is no having a lot of callus I am not certain even has an open wound if there is anything is extremely tiny. 11/05/2020 upon evaluation today patient's wounds actually appear to be doing a bit better compared to last week since we have uncovered some of these areas I think there are some definite improvements. With that being said there is still quite a bit of tunneling and undermining at many locations that again has me concerned I still think that the MRI is the best thing to do he has that scheduled for Saturday. 11/26/2020 upon evaluation today patient appears to be doing decently well in regard to his foot in general. He did have  an MRI I did review this and he had multiple findings on MRI consistent with osteomyelitis across the board in regard pretty much to his remaining metatarsal regions. Nonetheless there was also some evidence of some involvement of the navicular bone as well. Either way I think that this patient actually is in desperate need of get him in for hyperbaric oxygen therapy we discussed that today that will be detailed in the plan. Nonetheless he is also still on the doxycycline which I think is appropriate for the time being as well I did go ahead and place him on that following the results of the MRI based on what we were seeing. I gave him that prescription for 14 days for the time being and we will go from there. 12/03/2020 upon evaluation today patient appears to be doing decently well in regard to his wounds in general in regard to the right foot. With that being said he does have a callus on the left foot in the end there was actually an area open area here that will need to be addressed. This is small and I think we take care of it now hopefully will heal quite readily. Fortunately I do not see any signs of infection worsening I am getting need to refill the antibiotic for him today. He has been taking doxycycline which seems to be doing well. We will also get a go ahead and get him approved for HBO therapy. Electronic Signature(s) Signed: 12/03/2020 9:33:03 AM By: Joseph Keeler PA-C Entered By: [name] on 12/03/2020 09:33:03 -------------------------------------------------------------------------------- Physical Exam Details Patient Name: Date of Service: Joseph RD, Joseph Joseph J. 12/03/2020 8:45 A M Medical Record Number: [id number] Patient Account Number: [id number] Date of Birth/Sex: [name]: 1970/05/30 (50 y.o. [name] Primary  Care Provider: Durene [name] Other Clinician: Referring Provider: Treating Provider/Extender: Dorris Singh, Zelda Weeks in [name]:  11 Constitutional Well-nourished and well-hydrated in no acute distress. Respiratory normal breathing without difficulty. Psychiatric this patient is able to make decisions and demonstrates good insight into disease process. Alert and Oriented x 3. pleasant and cooperative. Notes Upon inspection patient's wound bed actually showed signs of good granulation epithelization at this point. Fortunately there appears to be significant improvement which is great news he still does have chronic osteomyelitis in the right foot. In the left foot area he did have a significant callus region that when removed revealed a wound up underneath. I did have to perform some debridement down to subcutaneous tissue here as well. Fortunately I feel like he is doing decently well overall. Electronic Signature(s) Signed: 12/03/2020 9:33:36 AM By: Joseph Keeler PA-C Entered By: [name] on 12/03/2020 09:33:35 -------------------------------------------------------------------------------- Physician Orders Details Patient Name: Date of Service: Joseph RD, Joseph Joseph J. 12/03/2020 8:45 A M Medical Record Number: [id number] Patient Account Number: [id number] Date of Birth/Sex: [name]: [birth year] (50 y.o. [name] Primary Care Provider: Durene [name] Other Clinician: Referring Provider: Treating Provider/Extender: [name]: 12 Verbal / Phone Orders: No Diagnosis Coding ICD-10 Coding Code Description E11.621 Type 2 diabetes mellitus with foot ulcer L97.514 Non-pressure chronic ulcer of other part of right foot with necrosis of bone T81.31XS Disruption of external operation (surgical) wound, not elsewhere classified, sequela L97.528 Non-pressure chronic ulcer of other part of left foot with other specified severity F17.218 Nicotine dependence, cigarettes, with other nicotine-induced disorders Follow-up Appointments ppointment in 1 week. - with Margarita Grizzle Return  A Bathing/ Shower/ Hygiene May shower and wash wound with soap and water. - on days you change dressing Edema Control - Lymphedema / SCD / Other Elevate legs to the level of the heart or above for 30 minutes daily and/or when sitting, a frequency of: Avoid standing for long periods of time. Moisturize legs daily. - to foot with dressing changes Off-Loading Open toe surgical shoe to: - Wear at all times except driving. on right foot Other: - may wear shoe with memory foam insert to left foot Additional Orders / Instructions Stop/Decrease Smoking Follow Nutritious Diet Non Wound Condition Other Non Wound Condition Orders/Instructions: - cushion bottom of left foot with orthopedic felt or foam [place] [facility] for wound care. May utilize formulary equivalent dressing for wound [name] orders unless otherwise specified. Dressing changes to be completed by [facility] on Monday / Wednesday / Friday except when patient has scheduled visit at [facility]. Hyperbaric Oxygen Therapy Indication: - wagner grade 3 diabetic foot ulcer of right foot If appropriate for [name], begin HBOT per protocol: 2.5 ATA for 90 Minutes with 2 Five (5) Minute A Breaks ir Total Number of Treatments: - 40 One treatments per day (delivered Monday through Friday unless otherwise specified in Special Instructions below): Finger stick Blood Glucose Pre- and Post- HBOT [name]. Follow Hyperbaric Oxygen Glycemia Protocol A frin (Oxymetazoline HCL) 0.05% nasal spray - 1 spray in both nostrils daily as needed prior to HBO [name] for difficulty clearing ears Wound [name] Wound #3 - Foot Wound Laterality: Right, Lateral, Proximal Cleanser: Soap and Water Every Other Day/30 Days Discharge Instructions: May shower and wash wound with dial antibacterial soap and water prior to dressing change. Cleanser: Wound Cleanser Every Other Day/30 Days Discharge Instructions: Cleanse the wound with  wound cleanser  prior to applying a clean dressing using gauze sponges, not tissue or cotton balls. Peri-Wound Care: Sween Lotion (Moisturizing lotion) Every Other Day/30 Days Discharge Instructions: Apply moisturizing lotion to foot with dressing changes Prim Dressing: KerraCel Ag Gelling Fiber Dressing, 4x5 in (silver alginate) (DME) (Dispense As Written) Every Other Day/30 Days ary Discharge Instructions: Pack into any undermined area Secondary Dressing: Woven Gauze Sponge, Non-Sterile 4x4 in (DME) (Generic) Every Other Day/30 Days Discharge Instructions: Apply over primary dressing as directed. Secondary Dressing: ABD Pad, 5x9 Every Other Day/30 Days Discharge Instructions: Apply over primary dressing as directed. Secured With: Coban Self-Adherent Wrap 4x5 (in/yd) (DME) (Generic) Every Other Day/30 Days Discharge Instructions: Secure with Coban as directed. Secured With: [employer], 4.5x3.1 (in/yd) (DME) (Generic) Every Other Day/30 Days Discharge Instructions: Secure with Kerlix as directed. Secured With: 32M Medipore H Soft Cloth Surgical Tape, 2x2 (in/yd) (DME) (Generic) Every Other Day/30 Days Discharge Instructions: Secure dressing with tape as directed. Wound #6 - Foot Wound Laterality: Plantar, Left Cleanser: Soap and Water Every Other Day/30 Days Discharge Instructions: May shower and wash wound with dial antibacterial soap and water prior to dressing change. Cleanser: Wound Cleanser Every Other Day/30 Days Discharge Instructions: Cleanse the wound with wound cleanser prior to applying a clean dressing using gauze sponges, not tissue or cotton balls. Peri-Wound Care: Sween Lotion (Moisturizing lotion) Every Other Day/30 Days Discharge Instructions: Apply moisturizing lotion to foot with dressing changes Prim Dressing: KerraCel Ag Gelling Fiber Dressing, 4x5 in (silver alginate) (DME) (Dispense As Written) Every Other Day/30 Days ary Discharge Instructions: Pack into any  undermined area Secondary Dressing: Woven Gauze Sponge, Non-Sterile 4x4 in (DME) (Generic) Every Other Day/30 Days Discharge Instructions: Apply over primary dressing as directed. Secondary Dressing: Optifoam Non-Adhesive Dressing, 4x4 in (DME) (Generic) Every Other Day/30 Days Discharge Instructions: Apply over primary dressing cut to make foam donut Secured With: Coban Self-Adherent Wrap 4x5 (in/yd) (DME) (Generic) Every Other Day/30 Days Discharge Instructions: Secure with Coban as directed. Secured With: [employer], 4.5x3.1 (in/yd) (DME) (Generic) Every Other Day/30 Days Discharge Instructions: Secure with Kerlix as directed. Secured With: 32M Medipore H Soft Cloth Surgical Tape, 2x2 (in/yd) (DME) (Generic) Every Other Day/30 Days Discharge Instructions: Secure dressing with tape as directed. GLYCEMIA INTERVENTIONS PROTOCOL PRE-HBO GLYCEMIA INTERVENTIONS ACTION INTERVENTION Obtain pre-HBO capillary blood glucose (ensure 1 physician order is in chart). A. Notify HBO physician and await physician orders. 2 If result is 70 mg/dl or below: B. If the result meets the hospital definition of a critical result, follow hospital policy. A. Give patient an 8 ounce Glucerna Shake, an 8 ounce Ensure, or 8 ounces of a Glucerna/Ensure equivalent dietary supplement*. B. Wait 30 minutes. If result is 71 mg/dl to 130 mg/dl: C. Retest patients capillary blood glucose (CBG). D. If result greater than or equal to 110 mg/dl, proceed with HBO. If result less than 110 mg/dl, notify HBO physician and consider holding HBO. If result is 131 mg/dl to 249 mg/dl: A. Proceed with HBO. A. Notify HBO physician and await physician orders. B. It is recommended to hold HBO and do If result is 250 mg/dl or greater: blood/urine ketone testing. C. If the result meets the hospital definition of a critical result, follow hospital policy. POST-HBO GLYCEMIA INTERVENTIONS ACTION INTERVENTION Obtain  post HBO capillary blood glucose (ensure 1 physician order is in chart). A. Notify HBO physician and await physician orders. 2 If result is 70 mg/dl or below: B. If the result meets the hospital definition  of a critical result, follow hospital policy. A. Give patient an 8 ounce Glucerna Shake, an 8 ounce Ensure, or 8 ounces of a Glucerna/Ensure equivalent dietary supplement*. B. Wait 15 minutes for symptoms of If result is 71 mg/dl to 100 mg/dl: hypoglycemia (i.e. nervousness, anxiety, sweating, chills, clamminess, irritability, confusion, tachycardia or dizziness). C. If patient asymptomatic, discharge patient. If patient symptomatic, repeat capillary blood glucose (CBG) and notify HBO physician. If result is 101 mg/dl to 249 mg/dl: A. Discharge patient. A. Notify HBO physician and await physician orders. B. It is recommended to do blood/urine ketone If result is 250 mg/dl or greater: testing. C. If the result meets the hospital definition of a critical result, follow hospital policy. *Juice or candies are NOT equivalent products. If patient refuses the Glucerna or Ensure, please consult the hospital dietitian for an appropriate substitute. Electronic Signature(s) Signed: 12/03/2020 5:29:03 PM By: [name] Signed: 12/03/2020 6:00:14 PM By: Joseph Keeler PA-C Entered By: [name] on 12/03/2020 09:43:43 -------------------------------------------------------------------------------- Problem List Details Patient Name: Date of Service: Joseph Hahn RD, Joseph Joseph J. 12/03/2020 8:45 A M Medical Record Number: [id number] Patient Account Number: [id number] Date of Birth/Sex: [name]: 05-Mar-1971 (50 y.o. [name] Primary Care Provider: Minette Brine, [place] Other Clinician: Referring Provider: Treating Provider/Extender: [name]: 12 Active Problems ICD-10 Encounter Code Description Active Date MDM Diagnosis [phone] Other  chronic osteomyelitis, right ankle and foot 09/05/2020 No Yes E11.621 Type 2 diabetes mellitus with foot ulcer 09/05/2020 No Yes L97.514 Non-pressure chronic ulcer of other part of right foot with necrosis of bone 09/05/2020 No Yes T81.31XS Disruption of external operation (surgical) wound, not elsewhere classified, 09/05/2020 No Yes sequela L97.522 Non-pressure chronic ulcer of other part of left foot with fat layer exposed 10/22/2020 No Yes F17.218 Nicotine dependence, cigarettes, with other nicotine-induced disorders 11/26/2020 No Yes Inactive Problems Resolved Problems Electronic Signature(s) Signed: 12/03/2020 9:32:15 AM By: Joseph Keeler PA-C Previous Signature: 12/03/2020 8:45:40 AM Version By: Joseph Keeler PA-C Entered By: [name] on 12/03/2020 09:32:15 -------------------------------------------------------------------------------- Progress Note Details Patient Name: Date of Service: Joseph RD, Joseph Joseph J. 12/03/2020 8:45 A M Medical Record Number: [id number] Patient Account Number: [id number] Date of Birth/Sex: [name]: Jan 28, 1971 (50 y.o. [name] Primary Care Provider: Durene [name] Other Clinician: Referring Provider: Treating Provider/Extender: [name]: 12 Subjective Chief Complaint Information obtained from Patient 09/05/2020; patient is here for review of the wound extensively on a right TMA amputation site. He also has a wound on the plantar aspect of the left foot History of Present Illness (HPI) ADMISSION 09/05/2020 This is a 50 year old man who has type 2 diabetes. Essentially the problem began admitted to [facility] health in December with infection gas gangrene. Cultures ultimately grew staph lugdunensis. 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 March Rummage. Shortly thereafter he developed a nonhealing wound with wound dehiscence noted on 05/09/2020. He had a wound VAC I think for a period  of time after the surgery but it did not help. He has undergone an IandD and skin graft on the right foot on 05/16/2020. He underwent a further IandD on 06/04/2020 not debrided to bone. 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 Culture of the metatarsal grew Enterobacter cloacae I. He has been followed by Dr. [name] of infectious disease. Currently on Levaquin and doxycycline I think at  about the 4-week of 6 mark. At the last visit with Dr. March Rummage he was supposed to have use Santyl although I do not think he has it and he has been using I think a Betadine wet-to-dry. He is referred here by Dr. [name] for our review who saw him yesterday. I cannot see that he has had imaging studies of the right foot. No MRI Past medical history includes type 2 diabetes, in 2021 and ulcer of the left foot that ultimately healed with a wound VAC, stage III chronic kidney disease, first toe amputations bilaterally, hypertension, still a 1/2 pack/day smoker. The patient had ABIs on 08/09/2018 which was 1.00 on the right and 0.96 on the left both areas had triphasic waveforms. Not felt to have an arterial issue. 09/12/2020; 1 week follow-up. He has been doing the dressings along with home health. This is a deep punched out area in the incision of his transmit site. We use silver alginate last week to help with drainage and antibacterial properties. He tolerated this well 6/24; the patient was actually at his podiatrist this morning who debrided the wound. We have been using silver alginate. They are going to follow him in 6 weeks. 6/29; patient is using silver alginate. Apparently saw Dr. March Rummage this week although I have not checked his note I will try to do so. He is following up in 6 weeks. This was the original transmetatarsal amputation when I first saw this 3 mid part of his amputation site was deep down to bone however this seems to  have progressively close down which is gratifying to see. He still has a fairly callus uneven surface however spreading this apart its difficult to identify anything that does not look epithelialized. When I first saw this I thought he would require an extensive debridement perhaps back in the OR by Dr. March Rummage however all of this appears to be looking better and at this point I would continue with the silver alginate see if this holds together over the next several weeks. He does not have an arterial issue 7/13; 2-week follow-up. Unfortunately the area did not hold together there was drainage on his dressing. Still copious amounts of callus. It was difficult to see where this actually was open therefore I elected to go ahead with a vigorous debridement. Still using silver alginate 7/20 I brought him back after extensive callus debridement last week. The only open area was on the medial part of the foot extensive debridement of thick surrounding callus and I was able to do identify the remaining tunneling area. 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 This is the area that we may have been following. This is smaller this week but still surrounded with thick callus He came in today with a large area of callus on the left midfoot. This I had noticed previously. HOWEVER our intake nurse clearly incorrectly documented this as opening. The callus and split there was an odor so all of this had to be removed. 10/29/2020 unfortunately upon evaluation today this patient appears to have significant mount of callus noted at this point. There does not appear to be any signs of active infection systemically which is great although locally he is having some discomfort around the lateral portion of his foot on the right. The left foot is no having a lot of callus I am not certain even has an open wound if there is anything  is extremely tiny. 11/05/2020  upon evaluation today patient's wounds actually appear to be doing a bit better compared to last week since we have uncovered some of these areas I think there are some definite improvements. With that being said there is still quite a bit of tunneling and undermining at many locations that again has me concerned I still think that the MRI is the best thing to do he has that scheduled for Saturday. 11/26/2020 upon evaluation today patient appears to be doing decently well in regard to his foot in general. He did have an MRI I did review this and he had multiple findings on MRI consistent with osteomyelitis across the board in regard pretty much to his remaining metatarsal regions. Nonetheless there was also some evidence of some involvement of the navicular bone as well. Either way I think that this patient actually is in desperate need of get him in for hyperbaric oxygen therapy we discussed that today that will be detailed in the plan. Nonetheless he is also still on the doxycycline which I think is appropriate for the time being as well I did go ahead and place him on that following the results of the MRI based on what we were seeing. I gave him that prescription for 14 days for the time being and we will go from there. 12/03/2020 upon evaluation today patient appears to be doing decently well in regard to his wounds in general in regard to the right foot. With that being said he does have a callus on the left foot in the end there was actually an area open area here that will need to be addressed. This is small and I think we take care of it now hopefully will heal quite readily. Fortunately I do not see any signs of infection worsening I am getting need to refill the antibiotic for him today. He has been taking doxycycline which seems to be doing well. We will also get a go ahead and get him approved for HBO therapy. Objective Constitutional Well-nourished and well-hydrated in no acute  distress. Vitals Time Taken: 8:38 AM, Height: 64 in, Weight: 188 lbs, BMI: 32.3, Temperature: 97.9 F, Pulse: 92 bpm, Respiratory Rate: 16 breaths/min, Blood Pressure: 157/91 mmHg, Capillary Blood Glucose: 180 mg/dl. Respiratory normal breathing without difficulty. Psychiatric this patient is able to make decisions and demonstrates good insight into disease process. Alert and Oriented x 3. pleasant and cooperative. General Notes: Upon inspection patient's wound bed actually showed signs of good granulation epithelization at this point. Fortunately there appears to be significant improvement which is great news he still does have chronic osteomyelitis in the right foot. In the left foot area he did have a significant callus region that when removed revealed a wound up underneath. I did have to perform some debridement down to subcutaneous tissue here as well. Fortunately I feel like he is doing decently well overall. Integumentary (Hair, Skin) Wound #1 status is Healed - Epithelialized. Original cause of wound was Surgical Injury. The date acquired was: 02/28/2020. The wound has been in [name] 12 weeks. The wound is located on the Right Amputation Site - Transmetatarsal. The wound measures 0cm length x 0cm width x 0cm depth; 0cm^2 area and 0cm^3 volume. There is no tunneling or undermining noted. There is a none present amount of drainage noted. The wound margin is distinct with the outline attached to the wound base. There is no granulation within the wound bed. There is no necrotic tissue within the  wound bed. General Notes: callous noted. Wound #3 status is Open. Original cause of wound was Gradually Appeared. The date acquired was: 10/29/2020. The wound has been in [name] 5 weeks. The wound is located on the Right,Proximal,Lateral Foot. The wound measures 0.7cm length x 0.3cm width x 0.9cm depth; 0.165cm^2 area and 0.148cm^3 volume. There is Fat Layer (Subcutaneous Tissue) exposed. There is  no tunneling noted, however, there is undermining starting at 12:00 and ending at 12:00 with a maximum distance of 0.6cm. There is a medium amount of serosanguineous drainage noted. The wound margin is thickened. There is large (67-100%) red granulation within the wound bed. There is no necrotic tissue within the wound bed. Wound #6 status is Open. Original cause of wound was Gradually Appeared. The date acquired was: 12/03/2020. The wound is located on the [place]. The wound measures 0.8cm length x 0.8cm width x 0.4cm depth; 0.503cm^2 area and 0.201cm^3 volume. There is Fat Layer (Subcutaneous Tissue) exposed. There is no tunneling or undermining noted. There is a medium amount of serous drainage noted. The wound margin is distinct with the outline attached to the wound base. There is large (67-100%) red granulation within the wound bed. There is no necrotic tissue within the wound bed. Assessment Active Problems ICD-10 Other chronic osteomyelitis, right ankle and foot Type 2 diabetes mellitus with foot ulcer Non-pressure chronic ulcer of other part of right foot with necrosis of bone Disruption of external operation (surgical) wound, not elsewhere classified, sequela Non-pressure chronic ulcer of other part of left foot with fat layer exposed Nicotine dependence, cigarettes, with other nicotine-induced disorders Procedures Wound #3 Pre-procedure diagnosis of Wound #3 is a Diabetic Wound/Ulcer of the Lower Extremity located on the Right,Proximal,Lateral Foot .Severity of Tissue Pre Debridement is: Fat layer exposed. There was a Excisional Skin/Subcutaneous Tissue Debridement with a total area of 0.32 sq cm performed by Joseph Keeler, PA. With the following instrument(s): Curette to remove Viable and Non-Viable tissue/material. Material removed includes Callus, Subcutaneous Tissue, Slough, and Skin: Epidermis. No specimens were taken. A time out was conducted at 09:10, prior to the  start of the procedure. A Minimum amount of bleeding was controlled with Pressure. The procedure was tolerated well with a pain level of 0 throughout and a pain level of 0 following the procedure. Post Debridement Measurements: 0.8cm length x 0.4cm width x 0.9cm depth; 0.226cm^3 volume. Character of Wound/Ulcer Post Debridement is improved. Severity of Tissue Post Debridement is: Fat layer exposed. Post procedure Diagnosis Wound #3: Same as Pre-Procedure Wound #6 Pre-procedure diagnosis of Wound #6 is a Diabetic Wound/Ulcer of the Lower Extremity located on the Left,Plantar Foot .Severity of Tissue Pre Debridement is: Fat layer exposed. There was a Excisional Skin/Subcutaneous Tissue Debridement with a total area of 35.75 sq cm performed by Joseph Keeler, PA. With the following instrument(s): Curette to remove Viable and Non-Viable tissue/material. Material removed includes Callus, Subcutaneous Tissue, Slough, and Skin: Epidermis. No specimens were taken. A time out was conducted at 09:10, prior to the start of the procedure. A Minimum amount of bleeding was controlled with Pressure. The procedure was tolerated well with a pain level of 0 throughout and a pain level of 0 following the procedure. Post Debridement Measurements: 0.8cm length x 0.8cm width x 0.4cm depth; 0.201cm^3 volume. Character of Wound/Ulcer Post Debridement is improved. Severity of Tissue Post Debridement is: Fat layer exposed. Post procedure Diagnosis Wound #6: Same as Pre-Procedure Plan Follow-up Appointments: Return Appointment in 1 week. - with Margarita Grizzle  Bathing/ Shower/ Hygiene: May shower and wash wound with soap and water. - on days you change dressing Edema Control - Lymphedema / SCD / Other: Elevate legs to the level of the heart or above for 30 minutes daily and/or when sitting, a frequency of: Avoid standing for long periods of time. Moisturize legs daily. - to foot with dressing changes Off-Loading: Open toe  surgical shoe to: - Wear at all times except driving. on right foot Other: - may wear shoe with memory foam insert to left foot Additional Orders / Instructions: Stop/Decrease Smoking Follow Nutritious Diet Non Wound Condition: Other Non Wound Condition Orders/Instructions: - cushion bottom of left foot with orthopedic felt or foam Home Health: Admit to [place] for wound care. May utilize formulary equivalent dressing for wound [name] orders unless otherwise specified. Dressing changes to be completed by [facility] on Monday / Wednesday / Friday except when patient has scheduled visit at [facility]. Hyperbaric Oxygen Therapy: Indication: - wagner grade 3 diabetic foot ulcer of right foot If appropriate for [name], begin HBOT per protocol: 2.5 ATA for 90 Minutes with 2 Five (5) Minute Air Breaks T Number of Treatments: - 40 otal One treatments per day (delivered Monday through Friday unless otherwise specified in Special Instructions below): Finger stick Blood Glucose Pre- and Post- HBOT [name]. Follow Hyperbaric Oxygen Glycemia Protocol Afrin (Oxymetazoline HCL) 0.05% nasal spray - 1 spray in both nostrils daily as needed prior to HBO [name] for difficulty clearing ears WOUND #3: - Foot Wound Laterality: Right, Lateral, Proximal Cleanser: Soap and Water Every Other Day/30 Days Discharge Instructions: May shower and wash wound with dial antibacterial soap and water prior to dressing change. Cleanser: Wound Cleanser Every Other Day/30 Days Discharge Instructions: Cleanse the wound with wound cleanser prior to applying a clean dressing using gauze sponges, not tissue or cotton balls. Peri-Wound Care: Sween Lotion (Moisturizing lotion) Every Other Day/30 Days Discharge Instructions: Apply moisturizing lotion to foot with dressing changes Prim Dressing: KerraCel Ag Gelling Fiber Dressing, 4x5 in (silver alginate) (DME) (Dispense As Written) Every Other Day/30  Days ary Discharge Instructions: Pack into any undermined area Secondary Dressing: Woven Gauze Sponge, Non-Sterile 4x4 in (DME) (Generic) Every Other Day/30 Days Discharge Instructions: Apply over primary dressing as directed. Secondary Dressing: ABD Pad, 5x9 Every Other Day/30 Days Discharge Instructions: Apply over primary dressing as directed. Secured With: [employer], 4.5x3.1 (in/yd) (DME) (Generic) Every Other Day/30 Days Discharge Instructions: Secure with Kerlix as directed. Secured With: 81M Medipore H Soft Cloth Surgical T ape, 2x2 (in/yd) (DME) (Generic) Every Other Day/30 Days Discharge Instructions: Secure dressing with tape as directed. WOUND #6: - Foot Wound Laterality: Plantar, Left Cleanser: Soap and Water Every Other Day/30 Days Discharge Instructions: May shower and wash wound with dial antibacterial soap and water prior to dressing change. Cleanser: Wound Cleanser Every Other Day/30 Days Discharge Instructions: Cleanse the wound with wound cleanser prior to applying a clean dressing using gauze sponges, not tissue or cotton balls. Peri-Wound Care: Sween Lotion (Moisturizing lotion) Every Other Day/30 Days Discharge Instructions: Apply moisturizing lotion to foot with dressing changes Prim Dressing: KerraCel Ag Gelling Fiber Dressing, 4x5 in (silver alginate) (DME) (Dispense As Written) Every Other Day/30 Days ary Discharge Instructions: Pack into any undermined area Secondary Dressing: Woven Gauze Sponge, Non-Sterile 4x4 in (DME) (Generic) Every Other Day/30 Days Discharge Instructions: Apply over primary dressing as directed. Secondary Dressing: Optifoam Non-Adhesive Dressing, 4x4 in (DME) (Generic) Every Other Day/30 Days Discharge Instructions: Apply  over primary dressing cut to make foam donut Secured With: [employer], 4.5x3.1 (in/yd) (DME) (Generic) Every Other Day/30 Days Discharge Instructions: Secure with Kerlix as directed. Secured With: 53M  Medipore H Soft Cloth Surgical T ape, 2x2 (in/yd) (DME) (Generic) Every Other Day/30 Days Discharge Instructions: Secure dressing with tape as directed. 1. Would recommend currently that we go ahead and continue with the silver alginate dressing I think this is probably still the best way to go. 2. I am also going to recommend that we have the patient continue with trying to is much as possible to offload in regard to the feet I do believe he is going need diabetic shoes we just need to get him healed so that he can get to that point. 3. I would recommend that we go ahead and see about getting approval for the HBO therapy. At this point he has his new insurance so we should be able to proceed everything else checks out he needs to get in as soon as possible in my opinion. We will see patient back for reevaluation in 1 week here in the clinic. If anything worsens or changes patient will contact our office for additional recommendations. Electronic Signature(s) Signed: 12/03/2020 9:34:33 AM By: Joseph Keeler PA-C Entered By: [name] on 12/03/2020 09:34:33 -------------------------------------------------------------------------------- SuperBill Details Patient Name: Date of Service: Joseph RD, Joseph Joseph J. 12/03/2020 Medical Record Number: [id number] Patient Account Number: [id number] Date of Birth/Sex: [name]: 12/14/1970 (50 y.o. [name] Primary Care Provider: Durene [name] Other Clinician: Referring Provider: Treating Provider/Extender: [name]: 12 Diagnosis Coding ICD-10 Codes Code Description E11.621 Type 2 diabetes mellitus with foot ulcer L97.514 Non-pressure chronic ulcer of other part of right foot with necrosis of bone T81.31XS Disruption of external operation (surgical) wound, not elsewhere classified, sequela L97.528 Non-pressure chronic ulcer of other part of left foot with other specified severity F17.218 Nicotine  dependence, cigarettes, with other nicotine-induced disorders Facility Procedures CPT4 Code: 32951884 Description: 16606 - DEB SUBQ TISSUE 20 SQ CM/< ICD-10 Diagnosis Description L97.514 Non-pressure chronic ulcer of other part of right foot with necrosis of bone L97.528 Non-pressure chronic ulcer of other part of left foot with other specified sever Modifier: ity Quantity: 1 CPT4 Code: [id number] Description: 11045 - DEB SUBQ TISS EA ADDL 20CM ICD-10 Diagnosis Description L97.514 Non-pressure chronic ulcer of other part of right foot with necrosis of bone L97.528 Non-pressure chronic ulcer of other part of left foot with other specified sever Modifier: ity Quantity: 1 Physician Procedures : CPT4 Code Description Modifier [id number] - WC PHYS SUBQ TISS 20 SQ CM ICD-10 Diagnosis Description L97.514 Non-pressure chronic ulcer of other part of right foot with necrosis of bone L97.528 Non-pressure chronic ulcer of other part of left foot  with other specified severity Quantity: 1 : [id number] - WC PHYS SUBQ TISS EA ADDL 20 CM ICD-10 Diagnosis Description L97.514 Non-pressure chronic ulcer of other part of right foot with necrosis of bone L97.528 Non-pressure chronic ulcer of other part of left foot with other specified  severity Quantity: 1 Electronic Signature(s) Signed: 12/03/2020 9:34:50 AM By: Joseph Keeler PA-C Entered By: [name] on 12/03/2020 09:34:49

## 2020-12-03 NOTE — Progress Notes (Signed)
[name], [name] ([id number]) Visit Report for 12/03/2020 Arrival Information Details Patient Name: Date of Service: [place] [address], [name] 12/03/2020 8:45 A M Medical Record Number: [id number] Patient Account Number: [id number] Date of Birth/Sex: Treating RN: July 16, 1970 (50 y.o. [name] Primary Care Hristopher Missildine: [name] Other Clinician: Referring Evelyn Moch: Treating Gawain Crombie/Extender: [name] Visit Information History Since Last Visit Added or deleted any medications: No Patient Arrived: Cane Any new allergies or adverse reactions: No Arrival Time: 08:38 Had a fall or experienced change in No Accompanied By: self activities of daily living that may affect Transfer Assistance: None risk of falls: Patient Identification Verified: Yes Signs or symptoms of abuse/neglect since last visito No Secondary Verification Process Completed: Yes Hospitalized since last visit: No Patient Requires Transmission-Based Precautions: No Implantable device outside of the clinic excluding No Patient Has Alerts: No cellular tissue based products placed in the center since last visit: Has Dressing in Place as Prescribed: Yes Has Footwear/Offloading in Place as Prescribed: Yes Left: Other:diabetic shoes Right: Other:diabetic shoes Pain Present Now: Yes Electronic Signature(s) Signed: 12/03/2020 5:29:03 PM By: [name] Entered By: [name] on 12/03/2020 08:49:13 -------------------------------------------------------------------------------- Encounter Discharge Information Details Patient Name: Date of Service: [name] RD, [place]. 12/03/2020 8:45 A M Medical Record Number: [id number] Patient Account Number: [id number] Date of Birth/Sex: Treating RN: [name] (50 y.o. [name] Primary Care Donyale Falcon: [name] Other Clinician: Referring Nyan Dufresne: Treating Sakib Noguez/Extender: Lenox Ponds Weeks in Treatment:  12 Encounter Discharge Information Items Post Procedure Vitals Discharge Condition: Stable Temperature (F): 97.9 Ambulatory Status: Cane Pulse (bpm): 92 Discharge Destination: Home Respiratory Rate (breaths/min): 16 Transportation: Private Auto Blood Pressure (mmHg): 157/91 Accompanied By: self Schedule Follow-up Appointment: Yes Clinical Summary of Care: Electronic Signature(s) Signed: 12/03/2020 5:29:03 PM By: [name] Entered By: [name] on 12/03/2020 09:44:35 -------------------------------------------------------------------------------- Lower Extremity Assessment Details Patient Name: Date of Service: [name], [name] 12/03/2020 8:45 A M Medical Record Number: [id number] Patient Account Number: [id number] Date of Birth/Sex: Treating RN: [name] (50 y.o. [name] Primary Care Maclovia Uher: [name] Other Clinician: Referring Tobey Lippard: Treating Jaylani Mcguinn/Extender: Dorris Singh, Zelda Weeks in Treatment: 12 Edema Assessment Assessed: [Left: Yes] [Right: Yes] Edema: [Left: No] [Right: No] Calf Left: Right: Point of Measurement: From Medial Instep 35 cm 33 cm Ankle Left: Right: Point of Measurement: From Medial Instep 23 cm 23 cm Vascular Assessment Pulses: Dorsalis Pedis Palpable: [Right:Yes] Electronic Signature(s) Signed: 12/03/2020 5:29:03 PM By: [name] Entered By: [name] on 12/03/2020 08:50:17 -------------------------------------------------------------------------------- [place] Details Patient Name: Date of Service: [name] RD, [place] 12/03/2020 8:45 A M Medical Record Number: [id number] Patient Account Number: [id number] Date of Birth/Sex: Treating RN: Oct 04, 1970 (50 y.o. [name] Primary Care Rewa Weissberg: [name] Other Clinician: Referring Koreen Lizaola: Treating Khloi Rawl/Extender: [name] Active Inactive Osteomyelitis Nursing  Diagnoses: Infection: osteomyelitis Knowledge deficit related to disease process and management Goals: Patient/caregiver will verbalize understanding of disease process and disease management Date Initiated: 11/26/2020 Target Resolution Date: 12/24/2020 Goal Status: Active Patient's osteomyelitis will resolve Date Initiated: 11/26/2020 Target Resolution Date: 12/24/2020 Goal Status: Active Interventions: Assess for signs and symptoms of osteomyelitis resolution every visit Provide education on osteomyelitis Screen for HBO Treatment Activities: Systemic antibiotics : 11/26/2020 Notes: Wound/Skin Impairment Nursing Diagnoses: Impaired tissue integrity Goals: Patient/caregiver will verbalize understanding of skin care regimen Date Initiated: 09/05/2020 Target Resolution Date: 12/25/2020 Goal  Status: Active Ulcer/skin breakdown will have a volume reduction of 30% by week 4 Date Initiated: 09/05/2020 Date Inactivated: 10/08/2020 Target Resolution Date: 10/03/2020 Goal Status: Met Interventions: Assess patient/caregiver ability to obtain necessary supplies Assess patient/caregiver ability to perform ulcer/skin care regimen upon admission and as needed Assess ulceration(s) every visit Provide education on ulcer and skin care Treatment Activities: Patient referred to home care : 09/05/2020 Skin care regimen initiated : 09/05/2020 Topical wound management initiated : 09/05/2020 Notes: Electronic Signature(s) Signed: 12/03/2020 5:29:03 PM By: [name] Entered By: [name] on 12/03/2020 08:52:45 -------------------------------------------------------------------------------- Pain Assessment Details Patient Name: Date of Service: [name] RD, [name] 12/03/2020 8:45 A M Medical Record Number: [id number] Patient Account Number: [id number] Date of Birth/Sex: Treating RN: [birth year] (50 y.o. [name] Primary Care Davon Folta: [name] Other Clinician: Referring  Edge Mauger: Treating Kekai Geter/Extender: [name] Active Problems Location of Pain Severity and Description of Pain Patient Has Paino Yes Site Locations Pain Location: Pain Location: Pain in Ulcers Rate the pain. Current Pain Level: 3 Worst Pain Level: 8 Least Pain Level: 0 Tolerable Pain Level: 8 Character of Pain Describe the Pain: Burning Pain Management and Medication Current Pain Management: Medication: No Cold Application: No Rest: No Massage: No Activity: No T.E.N.S.: No Heat Application: No Leg drop or elevation: No Is the Current Pain Management Adequate: Adequate How does your wound impact your activities of daily livingo Sleep: No Bathing: No Appetite: No Relationship With Others: No Bladder Continence: No Emotions: No Bowel Continence: No Work: No Toileting: No Drive: No Dressing: No Hobbies: No Electronic Signature(s) Signed: 12/03/2020 5:29:03 PM By: [name] Entered By: [name] on 12/03/2020 08:50:01 -------------------------------------------------------------------------------- Patient/Caregiver Education Details Patient Name: Date of Service: [name] RD, [name] 9/7/2022andnbsp8:45 A M Medical Record Number: [id number] Patient Account Number: [id number] Date of Birth/Gender: Treating RN: 07/12/1970 (50 y.o. [name] Primary Care Physician: [name] Other Clinician: Referring Physician: Treating Physician/Extender: Noni Saupe in Treatment: 12 Education Assessment Education Provided To: Patient Education Topics Provided Infection: Handouts: CDC antimicrobial patient education_English, Infection Prevention and Management Methods: Explain/Verbal Responses: Reinforcements needed Electronic Signature(s) Signed: 12/03/2020 5:29:03 PM By: [name] Signed: 12/03/2020 5:29:03 PM By: [name] Entered By: [name] on 12/03/2020  08:53:08 -------------------------------------------------------------------------------- Wound Assessment Details Patient Name: Date of Service: [name] RD, [place]. 12/03/2020 8:45 A M Medical Record Number: [id number] Patient Account Number: [id number] Date of Birth/Sex: Treating RN: [name] (50 y.o. 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 Secondary Open Surgical Wound Wounding Event: Surgical Injury Etiology: Date Acquired: 02/28/2020 Wound Healed - Epithelialized Weeks Of Treatment: 12 Status: Clustered Wound: No Comorbid Anemia, Coronary Artery Disease, Hypertension, Type II History: Diabetes, Osteomyelitis, Neuropathy Photos Wound Measurements Length: (cm) Width: (cm) Depth: (cm) Area: (cm) Volume: (cm) 0 % Reduction in Area: 100% 0 % Reduction in Volume: 100% 0 Epithelialization: Large (67-100%) 0 Tunneling: No 0 Undermining: No Wound Description Classification: Grade 3 Wound Margin: Distinct, outline attached Exudate Amount: None Present Foul Odor After Cleansing: No Slough/Fibrino No Wound Bed Granulation Amount: None Present (0%) Exposed Structure Necrotic Amount: None Present (0%) Fascia Exposed: No Fat Layer (Subcutaneous Tissue) Exposed: No Tendon Exposed: No Muscle Exposed: No Joint Exposed: No Bone Exposed:  No Assessment Notes callous noted. Treatment Notes Wound #1 (Amputation Site - Transmetatarsal) Wound Laterality: Right Cleanser Peri-Wound Care Topical Primary Dressing Secondary Dressing Secured With Compression Wrap Compression Stockings Add-Ons Electronic Signature(s) Signed: 12/03/2020 6:09:03 PM By: Baruch Gouty RN, BSN Entered By: Baruch Gouty on 12/03/2020 09:22:40 -------------------------------------------------------------------------------- Wound Assessment Details Patient Name: Date of Service: [name] RD, [name] 12/03/2020 8:45 A M Medical Record Number: [id number] Patient Account Number: [id number] Date of Birth/Sex: Treating RN: [name] (50 y.o. [name] Primary Care Carmello Cabiness: [name] Other Clinician: Referring Jabril Pursell: Treating Kwamaine Cuppett/Extender: Dorris Singh, Zelda Weeks in Treatment: 12 Wound Status Wound Number: 3 Primary Diabetic Wound/Ulcer of the Lower Extremity Etiology: Wound Location: Right, Proximal, Lateral Foot Wound Open Wounding Event: Gradually Appeared Status: Date Acquired: 10/29/2020 Comorbid Anemia, Coronary Artery Disease, Hypertension, Type II Weeks Of Treatment: 5 History: Diabetes, Osteomyelitis, Neuropathy Clustered Wound: No Photos Wound Measurements Length: (cm) 0.7 Width: (cm) 0.3 Depth: (cm) 0.9 Area: (cm) 0.165 Volume: (cm) 0.148 % Reduction in Area: -31% % Reduction in Volume: -68.2% Epithelialization: Large (67-100%) Tunneling: No Undermining: Yes Starting Position (o'clock): 12 Ending Position (o'clock): 12 Maximum Distance: (cm) 0.6 Wound Description Classification: Grade 3 Wound Margin: Thickened Exudate Amount: Medium Exudate Type: Serosanguineous Exudate Color: red, brown Foul Odor After Cleansing: No Slough/Fibrino No Wound Bed Granulation Amount: Large (67-100%) Exposed Structure Granulation Quality: Red Fascia Exposed: No Necrotic Amount: None Present (0%) Fat Layer (Subcutaneous Tissue) Exposed: Yes Tendon Exposed: No Muscle Exposed: No Joint Exposed: No Bone Exposed: No Treatment Notes Wound #3 (Foot) Wound Laterality: Right, Lateral, Proximal Cleanser Soap and Water Discharge Instruction: May shower and wash wound with dial antibacterial soap and water prior to dressing change. Wound Cleanser Discharge  Instruction: Cleanse the wound with wound cleanser prior to applying a clean dressing using gauze sponges, not tissue or cotton balls. Peri-Wound Care Sween Lotion (Moisturizing lotion) Discharge Instruction: Apply moisturizing lotion to foot with dressing changes Topical Primary Dressing KerraCel Ag Gelling Fiber Dressing, 4x5 in (silver alginate) Discharge Instruction: Pack into any undermined area Secondary Dressing Woven Gauze Sponge, Non-Sterile 4x4 in Discharge Instruction: Apply over primary dressing as directed. ABD Pad, 5x9 Discharge Instruction: Apply over primary dressing as directed. Secured With [employer] 4x5 (in/yd) Discharge Instruction: Secure with Coban as directed. Kerlix Roll Sterile, 4.5x3.1 (in/yd) Discharge Instruction: Secure with Kerlix as directed. 26M Medipore H Soft Cloth Surgical T ape, 2x2 (in/yd) Discharge Instruction: Secure dressing with tape as directed. Compression Wrap Compression Stockings Add-Ons Electronic Signature(s) Signed: 12/03/2020 5:29:03 PM By: [name] Entered By: [name] on 12/03/2020 08:55:51 -------------------------------------------------------------------------------- Wound Assessment Details Patient Name: Date of Service: [name] RD, [name] 12/03/2020 8:45 A M Medical Record Number: [id number] Patient Account Number: [id number] Date of Birth/Sex: Treating RN: May 06, 1970 (50 y.o. [name] Primary Care Haylei Cobin: [name] Other Clinician: Referring Aarav Burgett: Treating November Sypher/Extender: Dorris Singh, Zelda Weeks in Treatment: 12 Wound Status Wound Number: 6 Primary Diabetic Wound/Ulcer of the Lower Extremity Etiology: Wound Location: Left, Plantar Foot Wound Open Wounding Event: Gradually Appeared Status: Status: Date Acquired: 12/03/2020 Comorbid Anemia, Coronary Artery Disease, Hypertension, Type II Weeks Of Treatment: 0 History: Diabetes, Osteomyelitis,  Neuropathy Clustered Wound: No Wound Measurements Length: (cm) 0.8 Width: (cm) 0.8 Depth: (cm) 0.4 Area: (cm) 0.503 Volume: (cm) 0.201 % Reduction in Area: % Reduction in Volume: Epithelialization: None Tunneling: No Undermining: No Wound Description Classification: Grade 1 Wound Margin: Distinct, outline attached Exudate Amount: Medium Exudate Type: Serous  Exudate Color: amber Foul Odor After Cleansing: No Slough/Fibrino No Wound Bed Granulation Amount: Large (67-100%) Exposed Structure Granulation Quality: Red Fascia Exposed: No Necrotic Amount: None Present (0%) Fat Layer (Subcutaneous Tissue) Exposed: Yes Tendon Exposed: No Muscle Exposed: No Joint Exposed: No Bone Exposed: No Treatment Notes Wound #6 (Foot) Wound Laterality: Plantar, Left Cleanser Soap and Water Discharge Instruction: May shower and wash wound with dial antibacterial soap and water prior to dressing change. Wound Cleanser Discharge Instruction: Cleanse the wound with wound cleanser prior to applying a clean dressing using gauze sponges, not tissue or cotton balls. Peri-Wound Care Sween Lotion (Moisturizing lotion) Discharge Instruction: Apply moisturizing lotion to foot with dressing changes Topical Primary Dressing KerraCel Ag Gelling Fiber Dressing, 4x5 in (silver alginate) Discharge Instruction: Pack into any undermined area Secondary Dressing Woven Gauze Sponge, Non-Sterile 4x4 in Discharge Instruction: Apply over primary dressing as directed. Optifoam Non-Adhesive Dressing, 4x4 in Discharge Instruction: Apply over primary dressing cut to make foam donut Secured With Coban Self-Adherent Wrap 4x5 (in/yd) Discharge Instruction: Secure with Coban as directed. Kerlix Roll Sterile, 4.5x3.1 (in/yd) Discharge Instruction: Secure with Kerlix as directed. 75M Medipore H Soft Cloth Surgical T ape, 2x2 (in/yd) Discharge Instruction: Secure dressing with tape as directed. Compression  Wrap Compression Stockings Add-Ons Electronic Signature(s) Signed: 12/03/2020 6:09:03 PM By: Baruch Gouty RN, BSN Entered By: Baruch Gouty on 12/03/2020 09:17:31 -------------------------------------------------------------------------------- Vitals Details Patient Name: Date of Service: [name] RD, [name] 12/03/2020 8:45 A M Medical Record Number: [id number] Patient Account Number: [id number] Date of Birth/Sex: Treating RN: 04/20/1970 (50 y.o. [name] Primary Care Nirvana Blanchett: [name] Other Clinician: Referring Zackarie Chason: Treating Akiel Fennell/Extender: [name] Vital Signs Time Taken: 08:38 Temperature (F): 97.9 Height (in): 64 Pulse (bpm): 92 Weight (lbs): 188 Respiratory Rate (breaths/min): 16 Body Mass Index (BMI): 32.3 Blood Pressure (mmHg): 157/91 Capillary Blood Glucose (mg/dl): 180 Reference Range: 80 - 120 mg / dl Electronic Signature(s) Signed: 12/03/2020 5:29:03 PM By: [name] Entered By: [name] on 12/03/2020 08:49:43

## 2020-12-08 ENCOUNTER — Other Ambulatory Visit: Payer: Self-pay

## 2020-12-08 ENCOUNTER — Encounter (HOSPITAL_BASED_OUTPATIENT_CLINIC_OR_DEPARTMENT_OTHER): Payer: 59 | Admitting: Internal Medicine

## 2020-12-08 DIAGNOSIS — E11621 Type 2 diabetes mellitus with foot ulcer: Secondary | ICD-10-CM | POA: Diagnosis not present

## 2020-12-08 LAB — GLUCOSE, CAPILLARY
Glucose-Capillary: 127 mg/dL — ABNORMAL HIGH (ref 70–99)
Glucose-Capillary: 132 mg/dL — ABNORMAL HIGH (ref 70–99)

## 2020-12-09 ENCOUNTER — Encounter (HOSPITAL_BASED_OUTPATIENT_CLINIC_OR_DEPARTMENT_OTHER): Payer: 59 | Admitting: Internal Medicine

## 2020-12-09 ENCOUNTER — Ambulatory Visit (HOSPITAL_COMMUNITY): Payer: Medicare Other | Admitting: Licensed Clinical Social Worker

## 2020-12-10 ENCOUNTER — Encounter (HOSPITAL_BASED_OUTPATIENT_CLINIC_OR_DEPARTMENT_OTHER): Payer: 59 | Admitting: Physician Assistant

## 2020-12-10 NOTE — Progress Notes (Signed)
[name], [name] ([id number]) Visit Report for 12/08/2020 HBO Details Patient Name: Date of Service: [place] RD, [name] 12/08/2020 10:00 A M Medical Record Number: [id number] Patient Account Number: [id number] Date of Birth/Sex: Treating RN: [birth year] (50 y.o. [name] Primary Care Lynn Sissel: [name] Other Clinician: [name] Referring Sherwood Castilla: Treating Ahmod Gillespie/Extender: [name], [name] in Treatment: 13 HBO Treatment Course Details Treatment Course Number: 1 Ordering Teira Arcilla: [name] Treatments Ordered: otal 40 HBO Treatment Start Date: 12/08/2020 HBO Indication: Diabetic Ulcer(s) of the Lower Extremity HBO Treatment Details Treatment Number: Treatment Aborted/Not Restarted: Patient Choice Patient Type: Outpatient Chamber Type: Monoplace Chamber Serial #: [id number] Treatment Protocol: 2.5 ATA with 90 minutes oxygen, with two 5 minute air breaks Treatment Details Compression Rate Down: 1.0 psi / minute De-Compression Rate Up: 1.0 psi / minute A breaks and ir breathing Compress Tx Pressure Decompress Decompress periods Begins Reached Begins Ends (leave unused spaces blank) Chamber Pressure (ATA 1 2.5 2.5 2.5 2.5 2.5 - - 2.5 1 ) Clock Time (24 hr) 10:50 10:52 - - - - - - 10:52 10:54 Treatment Length: 4 (minutes) Treatment Segments: 0 Vital Signs Capillary Blood Glucose Reference Range: 80 - 120 mg / dl HBO Diabetic Blood Glucose Intervention Range: <131 mg/dl or >249 mg/dl Type: Time Vitals Blood Pulse: Respiratory Capillary Blood Glucose Pulse Action Temperature: Taken: Pressure: Rate: Glucose (mg/dl): Meter #: Oximetry (%) Taken: Pre 10:23 124/70 81 16 98.2 127 8 oz Glucerna Shake given Post 11:08 130/84 81 18 98.2 Pre 10:46 132 Treatment Response Treatment Toleration: Poor Treatment Completion Status: Treatment Aborted/Not Restarted Reason: Patient Choice Treatment Notes Oriented patient to hyperbaric  treatments as he only visited to take a look during his wound care encounter. T aught patient about how to equalize middle ear pressure during treatment. Patient's blood glucose level was below required level per protocols. Patient was given an 8 oz Glucerna shake. After 23 minutes patient's blood glucose was remeasured at 132 mg/dL which was above the recommended level of 131 mg/dL (110mg /dL on retest). Patient was placed in the chamber and chamber was set to pressurize to treatment depth. Upon reaching 2 psi, patient complained of pain to his left chest. I removed patient from chamber after allowing chamber to depressurize to 0 psig. At which point patient also complained of pain from his left ear. Vasco Chong examined patient. Patient states that he will start treatment tomorrow (12/09/20). Additional Procedure Documentation Tissue Sevierity: Fat layer exposed Cari Burgo Notes I was notified that after reaching two PSI the patient was complaining of left-sided chest pain and left-sided ear pain. Upon evaluation patient reports pain localized to his left shoulder that is worse with movement. He also states that the pain radiates to his ear. He denies shortness of breath or squeezing pain. Pain is replicated on movement of his left arm. He also reports radiated pain to his left ear. He denies issues with hearing. PE: Heart regular rate and rhythm, breath sounds clear to auscultation, left ear with visualized tympanic membrane. No drainage noted. Patient wanted to return and complete HBO treatment tomorrow. I think this is reasonable and does not require emergent evaluation. I think his pain is musculoskeletal in nature. Physician HBO Attestation: I certify that I supervised this HBO treatment in accordance with Medicare guidelines. A trained emergency response team is readily available per Yes hospital policies and procedures. Continue HBOT as ordered. Yes Electronic Signature(s) Signed: 12/09/2020  3:51:11 PM By: Kalman Shan DO Previous Signature:  12/08/2020 4:47:36 PM Version By: Kalman Shan DO Entered By: Kalman Shan on 12/09/2020 15:50:57 -------------------------------------------------------------------------------- HBO Safety Checklist Details Patient Name: Date of Service: [name], [name] 12/08/2020 10:00 A M Medical Record Number: [id number] Patient Account Number: [id number] Date of Birth/Sex: Treating RN: 09/30/1970 (50 y.o. [name] Primary Care [name]: [name] Other Clinician: [name] Referring Brodin Gelpi: Treating [name]/Extender: [name], [name] in Treatment: 13 HBO Safety Checklist Items Safety Checklist Consent Form Signed Patient voided / foley secured and emptied When did you last eato 0000 overnight Last dose of injectable or oral agent 0830 Ostomy pouch emptied and vented if applicable NA All implantable devices assessed, documented and approved NA Intravenous access site secured and place NA Valuables secured Linens and cotton and cotton/polyester blend (less than 51% polyester) Personal oil-based products / skin lotions / body lotions removed Wigs or hairpieces removed NA Smoking or tobacco materials removed NA Books / newspapers / magazines / loose paper removed Cologne, aftershave, perfume and deodorant removed Jewelry removed (may wrap wedding band) Make-up removed NA Hair care products removed Battery operated devices (external) removed Heating patches and chemical warmers removed Titanium eyewear removed NA Nail polish cured greater than 10 hours NA Casting material cured greater than 10 hours NA Hearing aids removed NA Loose dentures or partials removed NA Prosthetics have been removed NA Patient demonstrates correct use of air break device (if applicable) Patient concerns have been addressed Patient grounding bracelet on and cord attached to chamber Specifics  for Inpatients (complete in addition to above) Medication sheet sent with patient NA Intravenous medications needed or due during therapy sent with patient NA Drainage tubes (e.g. nasogastric tube or chest tube secured and vented) NA Endotracheal or Tracheotomy tube secured NA Cuff deflated of air and inflated with saline NA Airway suctioned NA Notes Paper version used prior to treatment. Data entered afterwards. Electronic Signature(s) Signed: 12/10/2020 6:20:23 PM By: [name] EMT Entered By: [name] on 12/08/2020 13:56:25

## 2020-12-10 NOTE — Progress Notes (Signed)
FALCON, MCCASKEY ([id number]) Visit Report for 12/08/2020 Arrival Information Details Patient Name: Date of Service: [address], [name] 12/08/2020 10:00 A M Medical Record Number: [id number] Patient Account Number: [id number] Date of Birth/Sex: Treating RN: Oct 19, 1970 (50 y.o. [name] Primary Care Reeya Bound: [name] Other Clinician: [name] Referring Devery Odwyer: Treating Lizbett Garciagarcia/Extender: [name], [name] in Treatment: 13 Visit Information History Since Last Visit All ordered tests and consults were completed: Yes Patient Arrived: Cane Added or deleted any medications: No Arrival Time: 09:30 Any new allergies or adverse reactions: No Accompanied By: self Had a fall or experienced change in No Transfer Assistance: None activities of daily living that may affect Patient Identification Verified: Yes risk of falls: Secondary Verification Process Completed: Yes Signs or symptoms of abuse/neglect since last visito No Patient Requires Transmission-Based Precautions: No Hospitalized since last visit: No Patient Has Alerts: No Implantable device outside of the clinic excluding No cellular tissue based products placed in the center since last visit: Pain Present Now: No Electronic Signature(s) Signed: 12/10/2020 6:20:23 PM By: [name] EMT Entered By: [name] on 12/08/2020 11:24:42 -------------------------------------------------------------------------------- Encounter Discharge Information Details Patient Name: Date of Service: [name], [name] 12/08/2020 10:00 A M Medical Record Number: [id number] Patient Account Number: [id number] Date of Birth/Sex: Treating RN: 1970/07/04 (50 y.o. [name] Primary Care Carzell Saldivar: [name] Other Clinician: [name] Referring Sreekar Broyhill: Treating Gaege Sangalang/Extender: [name] Weeks in Treatment: 13 Encounter Discharge Information  Items Discharge Condition: Stable Ambulatory Status: Ambulatory Discharge Destination: Home Transportation: Private Auto Accompanied By: self Schedule Follow-up Appointment: No Clinical Summary of Care: Electronic Signature(s) Signed: 12/10/2020 6:16:01 PM By: [name] EMT Entered By: [name] on 12/10/2020 18:16:01 -------------------------------------------------------------------------------- Patient/Caregiver Education Details Patient Name: Date of Service: [name], [name] 9/12/2022andnbsp10:00 A M Medical Record Number: [id number] Patient Account Number: [id number] Date of Birth/Gender: Treating RN: [birth year] (50 y.o. [name] Primary Care Physician: [name] Other Clinician: [name] Referring Physician: Treating Physician/Extender: Katy Fitch in Treatment: 13 Education Assessment Education Provided To: Patient Education Topics Provided Hyperbaric Oxygenation: Handouts: Hyperbaric Oxygen Methods: Explain/Verbal Responses: Reinforcements needed Electronic Signature(s) Signed: 12/10/2020 6:09:32 PM By: [name] EMT Entered By: [name] on 12/10/2020 18:09:32 -------------------------------------------------------------------------------- [place] Details Patient Name: Date of Service: [name], [name] 12/08/2020 10:00 A M Medical Record Number: [id number] Patient Account Number: [id number] Date of Birth/Sex: Treating RN: 1971/02/01 (50 y.o. [name] Primary Care Aubrianne Molyneux: [name] Other Clinician: [name] Referring Romana Deaton: Treating Sterling Mondo/Extender: [name], [name] in Treatment: 13 Vital Signs Time Taken: 10:23 Temperature (F): 98.2 Height (in): 64 Pulse (bpm): 81 Weight (lbs): 188 Respiratory Rate (breaths/min): 16 Body Mass Index (BMI): 32.3 Blood Pressure (mmHg): 124/70 Capillary Blood Glucose (mg/dl):  127 Reference Range: 80 - 120 mg / dl Electronic Signature(s) Signed: 12/10/2020 6:20:23 PM By: [name] EMT Entered By: [name] on 12/08/2020 12:00:35

## 2020-12-11 ENCOUNTER — Encounter (HOSPITAL_BASED_OUTPATIENT_CLINIC_OR_DEPARTMENT_OTHER): Payer: 59 | Admitting: Internal Medicine

## 2020-12-11 NOTE — Progress Notes (Signed)
ALQUAN, MORRISH ([id number]) Visit Report for 12/08/2020 SuperBill Details Patient Name: Date of Service: [address] RD, [name] 12/08/2020 Medical Record Number: [id number] Patient Account Number: [id number] Date of Birth/Sex: Treating RN: 1970-10-31 (50 y.o. [name] Primary Care Provider: [name] Other Clinician: [name] Referring Provider: Treating Provider/Extender: [name], [name] in Treatment: 13 Diagnosis Coding ICD-10 Codes Code Description E11.621 Type 2 diabetes mellitus with foot ulcer L97.514 Non-pressure chronic ulcer of other part of right foot with necrosis of bone T81.31XS Disruption of external operation (surgical) wound, not elsewhere classified, sequela L97.528 Non-pressure chronic ulcer of other part of left foot with other specified severity F17.218 Nicotine dependence, cigarettes, with other nicotine-induced disorders Facility Procedures CPT4 Code Description Modifier Quantity [id number] - WOUND CARE VISIT-LEV 3 EST PT 1 Electronic Signature(s) Signed: 12/10/2020 6:26:00 PM By: [name] EMT Signed: 12/11/2020 8:48:03 AM By: Kalman Shan DO Entered By: [name] on 12/10/2020 18:25:59

## 2020-12-12 ENCOUNTER — Encounter (HOSPITAL_BASED_OUTPATIENT_CLINIC_OR_DEPARTMENT_OTHER): Payer: 59 | Admitting: Internal Medicine

## 2020-12-15 ENCOUNTER — Encounter (HOSPITAL_BASED_OUTPATIENT_CLINIC_OR_DEPARTMENT_OTHER): Payer: 59 | Admitting: Internal Medicine

## 2020-12-16 ENCOUNTER — Other Ambulatory Visit: Payer: Self-pay | Admitting: Nurse Practitioner

## 2020-12-16 ENCOUNTER — Encounter (HOSPITAL_BASED_OUTPATIENT_CLINIC_OR_DEPARTMENT_OTHER): Payer: 59 | Admitting: Internal Medicine

## 2020-12-16 DIAGNOSIS — I10 Essential (primary) hypertension: Secondary | ICD-10-CM

## 2020-12-16 DIAGNOSIS — E785 Hyperlipidemia, unspecified: Secondary | ICD-10-CM

## 2020-12-16 DIAGNOSIS — F321 Major depressive disorder, single episode, moderate: Secondary | ICD-10-CM

## 2020-12-16 DIAGNOSIS — E1142 Type 2 diabetes mellitus with diabetic polyneuropathy: Secondary | ICD-10-CM

## 2020-12-17 ENCOUNTER — Encounter (HOSPITAL_BASED_OUTPATIENT_CLINIC_OR_DEPARTMENT_OTHER): Payer: 59 | Admitting: Physician Assistant

## 2020-12-17 NOTE — Telephone Encounter (Signed)
Please schedule in-person appointment for medication refills.

## 2020-12-18 ENCOUNTER — Encounter (HOSPITAL_BASED_OUTPATIENT_CLINIC_OR_DEPARTMENT_OTHER): Payer: 59 | Admitting: Internal Medicine

## 2020-12-19 ENCOUNTER — Encounter (HOSPITAL_BASED_OUTPATIENT_CLINIC_OR_DEPARTMENT_OTHER): Payer: 59 | Admitting: Internal Medicine

## 2020-12-22 ENCOUNTER — Encounter (HOSPITAL_BASED_OUTPATIENT_CLINIC_OR_DEPARTMENT_OTHER): Payer: 59 | Admitting: Internal Medicine

## 2020-12-22 ENCOUNTER — Other Ambulatory Visit: Payer: Self-pay | Admitting: Nurse Practitioner

## 2020-12-22 DIAGNOSIS — F321 Major depressive disorder, single episode, moderate: Secondary | ICD-10-CM

## 2020-12-22 DIAGNOSIS — Z794 Long term (current) use of insulin: Secondary | ICD-10-CM

## 2020-12-22 DIAGNOSIS — K5909 Other constipation: Secondary | ICD-10-CM

## 2020-12-22 DIAGNOSIS — I10 Essential (primary) hypertension: Secondary | ICD-10-CM

## 2020-12-22 DIAGNOSIS — I25119 Atherosclerotic heart disease of native coronary artery with unspecified angina pectoris: Secondary | ICD-10-CM

## 2020-12-22 DIAGNOSIS — E785 Hyperlipidemia, unspecified: Secondary | ICD-10-CM

## 2020-12-23 ENCOUNTER — Encounter (HOSPITAL_BASED_OUTPATIENT_CLINIC_OR_DEPARTMENT_OTHER): Payer: 59 | Admitting: Internal Medicine

## 2020-12-23 ENCOUNTER — Other Ambulatory Visit: Payer: Self-pay

## 2020-12-23 ENCOUNTER — Telehealth: Payer: Self-pay | Admitting: *Deleted

## 2020-12-23 DIAGNOSIS — E11621 Type 2 diabetes mellitus with foot ulcer: Secondary | ICD-10-CM | POA: Diagnosis not present

## 2020-12-23 LAB — GLUCOSE, CAPILLARY
Glucose-Capillary: 144 mg/dL — ABNORMAL HIGH (ref 70–99)
Glucose-Capillary: 103 mg/dL — ABNORMAL HIGH (ref 70–99)

## 2020-12-23 NOTE — Telephone Encounter (Signed)
Requested medication (s) are due for refill today:    Requested medication (s) are on the active medication list:    Future visit scheduled:      Last ordered   Requested Prescriptions  Pending Prescriptions Disp Refills   TRULICITY 1.02 HE/5.2DP SOPN [Pharmacy Med Name: Trulicity 8.24 MP/5.3IR Solution pen-injector] 2 mL 10    Sig: [place]     Endocrinology:  Diabetes - GLP-1 Receptor Agonists Failed - 12/22/2020  1:07 PM      Failed - HBA1C is between 0 and 7.9 and within 180 days    HbA1c, POC (controlled diabetic range)  Date Value Ref Range Status  09/23/2020 10.8 (A) 0.0 - 7.0 % Final          Passed - Valid encounter within last 6 months    Recent Outpatient Visits           4 weeks ago Type 2 diabetes mellitus with diabetic polyneuropathy, with long-term current use of insulin ([place])   [place], Annie Main L, RPH-CPP   2 months ago Type 2 diabetes mellitus with diabetic polyneuropathy, with long-term current use of insulin ([place])   [place], Jarome Matin, RPH-CPP   3 months ago Type 2 diabetes mellitus with diabetic polyneuropathy, with long-term current use of insulin ([place])   [place], [place] W, NP   6 months ago Diabetes mellitus with neurological manifestations, uncontrolled ([place])   [place], Vernia Buff, NP   6 months ago Hospital discharge follow-up   [place], [place] W, NP               Cholecalciferol (VITAMIN D3) 50 MCG (2000 UT) capsule [Pharmacy Med Name: Vitamin D3 50 MCG (2000 UT) Capsule] 30 capsule 10    Sig: TAKE [place] MORNING     Endocrinology:  Vitamins - Vitamin D Supplementation Failed - 12/22/2020  1:07 PM      Failed - 50,000 IU strengths are not delegated      Failed - Phosphate in normal range and  within 360 days    No results found for: PHOS        Failed - Vitamin D in normal range and within 360 days    Vit D, 1,25-Dihydroxy  Date Value Ref Range Status  01/19/2008 13 (L) 30 - 89 Final    Comment:    See lab report for associated comment(s)   VITD  Date Value Ref Range Status  01/24/2019 14.82 (L) 30.00 - 100.00 ng/mL Final          Passed - Ca in normal range and within 360 days    Calcium  Date Value Ref Range Status  10/17/2020 8.9 8.6 - 10.3 mg/dL Final   Calcium, Ion  Date Value Ref Range Status  04/23/2011 1.17 1.12 - 1.32 mmol/L Final          Passed - Valid encounter within last 12 months    Recent Outpatient Visits           4 weeks ago Type 2 diabetes mellitus with diabetic polyneuropathy, with long-term current use of insulin [facility])   [place], Annie Main L, RPH-CPP   2 months ago Type 2 diabetes mellitus with diabetic polyneuropathy, with long-term current use of insulin ([place])  [place], Jarome Matin, RPH-CPP   3 months ago Type 2 diabetes mellitus with diabetic polyneuropathy, with long-term current use of insulin [facility])   [place], [place], NP   6 months ago Diabetes mellitus with neurological manifestations, uncontrolled ([place])   [place], Vernia Buff, NP   6 months ago Hospital discharge follow-up   [place], Vernia Buff, NP               gabapentin (NEURONTIN) 300 MG capsule [Pharmacy Med Name: Gabapentin 300 MG Capsule] 90 capsule 10    Sig: TAKE 1 CAPSULE BY MOUTH THREE TIMES DAILY     Neurology: Anticonvulsants - gabapentin Passed - 12/22/2020  1:07 PM      Passed - Valid encounter within last 12 months    Recent Outpatient Visits           4 weeks ago Type 2 diabetes mellitus with diabetic polyneuropathy, with long-term current use of  insulin ([place])   [place], Jarome Matin, RPH-CPP   2 months ago Type 2 diabetes mellitus with diabetic polyneuropathy, with long-term current use of insulin ([place])   [place], Jarome Matin, RPH-CPP   3 months ago Type 2 diabetes mellitus with diabetic polyneuropathy, with long-term current use of insulin ([place])   [place], [place] W, NP   6 months ago Diabetes mellitus with neurological manifestations, uncontrolled ([place])   [place], Vernia Buff, NP   6 months ago Hospital discharge follow-up   [place], Zelda W, NP               B Complex-C (B-COMPLEX WITH VITAMIN C) tablet [Pharmacy Med Name: B Complex-C Tablet] 30 tablet 10    Sig: TAKE 1 TABLET EVERY MORNING     There is no refill protocol information for this order     amLODipine (NORVASC) 10 MG tablet [Pharmacy Med Name: amLODIPine Besylate 10 MG Tablet] 30 tablet 10    Sig: TAKE 1 TABLET EVERY MORNING     Cardiovascular:  Calcium Channel Blockers Failed - 12/22/2020  1:07 PM      Failed - Last BP in normal range    BP Readings from Last 1 Encounters:  11/26/20 (!) 165/92          Passed - Valid encounter within last 6 months    Recent Outpatient Visits           4 weeks ago Type 2 diabetes mellitus with diabetic polyneuropathy, with long-term current use of insulin ([place])   [place], Annie Main L, RPH-CPP   2 months ago Type 2 diabetes mellitus with diabetic polyneuropathy, with long-term current use of insulin [facility])   [place], Annie Main L, RPH-CPP   3 months ago Type 2 diabetes mellitus with diabetic polyneuropathy, with long-term current use of insulin [facility])   [place], [place] W, NP   6 months ago  Diabetes mellitus with neurological manifestations, uncontrolled ([place])   [place] Gildardo Pounds, NP   6 months ago Hospital discharge follow-up   [place] Gildardo Pounds, NP  ACETAMINOPHEN EXTRA STRENGTH 500 MG tablet [Pharmacy Med Name: Acetaminophen Extra Strength 500 MG Tablet] 240 tablet 10    Sig: TAKE 2 TABLETS EVERY 6 HOURS AS NEEDED FOR PAIN     Over the Counter:  OTC Passed - 12/22/2020  1:07 PM      Passed - Valid encounter within last 12 months    Recent Outpatient Visits           4 weeks ago Type 2 diabetes mellitus with diabetic polyneuropathy, with long-term current use of insulin ([place])   [place], Annie Main L, RPH-CPP   2 months ago Type 2 diabetes mellitus with diabetic polyneuropathy, with long-term current use of insulin ([place])   [place], Jarome Matin, RPH-CPP   3 months ago Type 2 diabetes mellitus with diabetic polyneuropathy, with long-term current use of insulin ([place])   [place], [place] W, NP   6 months ago Diabetes mellitus with neurological manifestations, uncontrolled ([place])   [place], Vernia Buff, NP   6 months ago Hospital discharge follow-up   [place], Zelda W, NP               escitalopram (LEXAPRO) 20 MG tablet [Pharmacy Med Name: Escitalopram Oxalate 20 MG Tablet] 30 tablet 10    Sig: TAKE 1 TABLET BY MOUTH EVERY MORNING     Psychiatry:  Antidepressants - SSRI Passed - 12/22/2020  1:07 PM      Passed - Completed PHQ-2 or PHQ-9 in the last 360 days      Passed - Valid encounter within last 6 months    Recent Outpatient Visits           4 weeks ago Type 2 diabetes mellitus with diabetic polyneuropathy, with long-term current use of insulin ([place])   [place], Annie Main L, RPH-CPP   2 months ago Type 2 diabetes mellitus with diabetic polyneuropathy, with long-term current use of insulin ([place])   [place], Annie Main L, RPH-CPP   3 months ago Type 2 diabetes mellitus with diabetic polyneuropathy, with long-term current use of insulin ([place])   [place], [place] W, NP   6 months ago Diabetes mellitus with neurological manifestations, uncontrolled ([place])   [place], Vernia Buff, NP   6 months ago Hospital discharge follow-up   [place], [place] W, NP               ASPIRIN ADULT LOW STRENGTH 81 MG EC tablet [Pharmacy Med Name: Aspirin Adult Low Strength 81 MG Tablet delayed release] 30 tablet 10    Sig: TAKE 1 TABLET BY MOUTH EVERY MORNING     Analgesics:  NSAIDS - aspirin Passed - 12/22/2020  1:07 PM      Passed - Patient is not pregnant      Passed - Valid encounter within last 12 months    Recent Outpatient Visits           4 weeks ago Type 2 diabetes mellitus with diabetic polyneuropathy, with long-term current use of insulin [facility])   [place], Annie Main L, RPH-CPP   2 months ago Type 2 diabetes mellitus with diabetic polyneuropathy, with long-term current  use of insulin [facility])   [place], Jarome Matin, RPH-CPP   3 months ago Type 2 diabetes mellitus with diabetic polyneuropathy, with long-term current use of insulin [facility])   [place], [place] W, NP   6 months ago Diabetes mellitus with neurological manifestations, uncontrolled ([place])   [place], Vernia Buff, NP   6 months ago Hospital discharge follow-up   [place], NP                cloNIDine (CATAPRES) 0.1 MG tablet [Pharmacy Med Name: cloNIDine HCl 0.1 MG Tablet] 60 tablet 10    Sig: TAKE 1 TABLET BY MOUTH TWICE DAILY     Cardiovascular:  Alpha-2 Agonists Failed - 12/22/2020  1:07 PM      Failed - Last BP in normal range    BP Readings from Last 1 Encounters:  11/26/20 (!) 165/92          Passed - Last Heart Rate in normal range    Pulse Readings from Last 1 Encounters:  11/26/20 94          Passed - Valid encounter within last 6 months    Recent Outpatient Visits           4 weeks ago Type 2 diabetes mellitus with diabetic polyneuropathy, with long-term current use of insulin ([place])   [place], Annie Main L, RPH-CPP   2 months ago Type 2 diabetes mellitus with diabetic polyneuropathy, with long-term current use of insulin [facility])   [place], Annie Main L, RPH-CPP   3 months ago Type 2 diabetes mellitus with diabetic polyneuropathy, with long-term current use of insulin [facility])   [place], [place] W, NP   6 months ago Diabetes mellitus with neurological manifestations, uncontrolled ([place])   [place], Vernia Buff, NP   6 months ago Hospital discharge follow-up   [place], NP               insulin glargine-yfgn (SEMGLEE) 100 UNIT/ML Pen [Pharmacy Med Name: Insulin Glargine-yfgn 100 UNIT/ML Solution pen-injector] 9 mL 10    Sig: INJECT 15MLS SUBCUTANEOUSLY TWICE DAILY     Off-Protocol Failed - 12/22/2020  1:07 PM      Failed - Medication not assigned to a protocol, review manually.      Passed - Valid encounter within last 12 months    Recent Outpatient Visits           4 weeks ago Type 2 diabetes mellitus with diabetic polyneuropathy, with long-term current use of insulin [facility])   [place], Annie Main  L, RPH-CPP   2 months ago Type 2 diabetes mellitus with diabetic polyneuropathy, with long-term current use of insulin [facility])   [place], Annie Main L, RPH-CPP   3 months ago Type 2 diabetes mellitus with diabetic polyneuropathy, with long-term current use of insulin [facility])   [place], [place] W, NP   6 months ago Diabetes mellitus with neurological manifestations, uncontrolled ([place])   [place] Gildardo Pounds, NP   6 months ago Hospital discharge follow-up   [facility] [place], Zelda W, NP               insulin lispro (HUMALOG) 100 UNIT/ML injection [Pharmacy Med Name: Insulin Lispro 100 UNIT/ML Solution] 6 mL 10    Sig: PER SLIDING SCALE     Endocrinology:  Diabetes - Insulins Failed - 12/22/2020  1:07 PM      Failed - HBA1C is between 0 and 7.9 and within 180 days    HbA1c, POC (controlled diabetic range)  Date Value Ref Range Status  09/23/2020 10.8 (A) 0.0 - 7.0 % Final          Passed - Valid encounter within last 6 months    Recent Outpatient Visits           4 weeks ago Type 2 diabetes mellitus with diabetic polyneuropathy, with long-term current use of insulin ([place])   [place], Jarome Matin, RPH-CPP   2 months ago Type 2 diabetes mellitus with diabetic polyneuropathy, with long-term current use of insulin ([place])   [place], Jarome Matin, RPH-CPP   3 months ago Type 2 diabetes mellitus with diabetic polyneuropathy, with long-term current use of insulin ([place])   [place], Vernia Buff, NP   6 months ago Diabetes mellitus with neurological manifestations, uncontrolled ([place])   [place], Vernia Buff, NP   6 months ago Hospital discharge follow-up   [place], [place], NP               ferrous sulfate 325 (65 FE) MG EC tablet [Pharmacy Med Name: Ferrous Sulfate 325 (65 Fe) MG Tablet delayed release] 30 tablet 10    Sig: TAKE 1 TABLET BY [place] MORNING     Endocrinology:  Minerals - Iron Supplementation Failed - 12/22/2020  1:07 PM      Failed - HGB in normal range and within 360 days    Hemoglobin  Date Value Ref Range Status  10/17/2020 9.7 (L) 13.2 - 17.1 g/dL Final  10/14/2020 10.4 (L) 13.0 - 17.0 g/dL Final  06/24/2020 10.8 (L) 13.0 - 17.7 g/dL Final          Failed - HCT in normal range and within 360 days    HCT  Date Value Ref Range Status  10/17/2020 30.8 (L) 38.5 - 50.0 % Final   Hematocrit  Date Value Ref Range Status  06/24/2020 33.5 (L) 37.5 - 51.0 % Final          Failed - RBC in normal range and within 360 days    RBC  Date Value Ref Range Status  10/17/2020 3.67 (L) 4.20 - 5.80 Million/uL Final          Passed - Fe (serum) in normal range and within 360 days    Iron  Date Value Ref Range Status  07/04/2020 45 42 - 163 ug/dL Final   Saturation Ratios  Date Value Ref Range Status  07/04/2020 17 (L) 20 - 55 % Final          Passed - Ferritin in normal range and within 360 days    Ferritin  Date Value Ref Range Status  07/04/2020 176 24 - 336 ng/mL Final    Comment:    Performed at [facility], [address], [place], [place] [address]  Passed - Valid encounter within last 12 months    Recent Outpatient Visits           4 weeks ago Type 2 diabetes mellitus with diabetic polyneuropathy, with long-term current use of insulin ([place])   [place], Jarome Matin, RPH-CPP   2 months ago Type 2 diabetes mellitus with diabetic polyneuropathy, with long-term current use of insulin ([place])   [place], Jarome Matin, RPH-CPP   3 months ago Type 2 diabetes mellitus with  diabetic polyneuropathy, with long-term current use of insulin ([place])   [place], Vernia Buff, NP   6 months ago Diabetes mellitus with neurological manifestations, uncontrolled ([place])   [place], Vernia Buff, NP   6 months ago Hospital discharge follow-up   [place], Vernia Buff, NP               carvedilol (COREG) 25 MG tablet [Pharmacy Med Name: Carvedilol 25 MG Tablet] 60 tablet 10    Sig: TAKE 1 TABLET TWICE DAILY     Cardiovascular:  Beta Blockers Failed - 12/22/2020  1:07 PM      Failed - Last BP in normal range    BP Readings from Last 1 Encounters:  11/26/20 (!) 165/92          Passed - Last Heart Rate in normal range    Pulse Readings from Last 1 Encounters:  11/26/20 94          Passed - Valid encounter within last 6 months    Recent Outpatient Visits           4 weeks ago Type 2 diabetes mellitus with diabetic polyneuropathy, with long-term current use of insulin ([place])   [place], Annie Main L, RPH-CPP   2 months ago Type 2 diabetes mellitus with diabetic polyneuropathy, with long-term current use of insulin ([place])   [place], Annie Main L, RPH-CPP   3 months ago Type 2 diabetes mellitus with diabetic polyneuropathy, with long-term current use of insulin ([place])   [place], [place] W, NP   6 months ago Diabetes mellitus with neurological manifestations, uncontrolled ([place])   [place], Vernia Buff, NP   6 months ago Hospital discharge follow-up   [place], [place] W, NP               atorvastatin (LIPITOR) 20 MG tablet [Pharmacy Med Name: Atorvastatin Calcium 20 MG Tablet] 30 tablet 10    Sig: TAKE 1 TABLET BY MOUTH DAILY AT 9:00 AM     Cardiovascular:   Antilipid - Statins Failed - 12/22/2020  1:07 PM      Failed - Total Cholesterol in normal range and within 360 days    Cholesterol, Total  Date Value Ref Range Status  10/10/2019 97 (L) 100 - 199 mg/dL Final          Failed - LDL in normal range and within 360 days    LDL Chol Calc (NIH)  Date Value Ref Range Status  10/10/2019 39 0 - 99 mg/dL Final          Failed - HDL in normal range and within 360 days    HDL  Date Value Ref  Range Status  10/10/2019 47 >39 mg/dL Final          Failed - Triglycerides in normal range and within 360 days    Triglycerides  Date Value Ref Range Status  10/10/2019 43 0 - 149 mg/dL Final          Passed - Patient is not pregnant      Passed - Valid encounter within last 12 months    Recent Outpatient Visits           4 weeks ago Type 2 diabetes mellitus with diabetic polyneuropathy, with long-term current use of insulin ([place])   [place], Annie Main L, RPH-CPP   2 months ago Type 2 diabetes mellitus with diabetic polyneuropathy, with long-term current use of insulin ([place])   [place], Annie Main L, RPH-CPP   3 months ago Type 2 diabetes mellitus with diabetic polyneuropathy, with long-term current use of insulin ([place])   [place], [place] W, NP   6 months ago Diabetes mellitus with neurological manifestations, uncontrolled ([place])   [place], Vernia Buff, NP   6 months ago Hospital discharge follow-up   [place], [place] W, NP               senna (SENOKOT) 8.6 MG TABS tablet [Pharmacy Med Name: Senna 8.6 MG Tablet] 30 tablet 10    Sig: TAKE 1 TABLET BY MOUTH DAILY AS NEEDED FOR CONSTIPATION     Over the Counter:  OTC Passed - 12/22/2020  1:07 PM      Passed - Valid encounter within last 12 months    Recent Outpatient Visits           4  weeks ago Type 2 diabetes mellitus with diabetic polyneuropathy, with long-term current use of insulin ([place])   [place], Annie Main L, RPH-CPP   2 months ago Type 2 diabetes mellitus with diabetic polyneuropathy, with long-term current use of insulin ([place])   [place], Annie Main L, RPH-CPP   3 months ago Type 2 diabetes mellitus with diabetic polyneuropathy, with long-term current use of insulin ([place])   [place], [place] W, NP   6 months ago Diabetes mellitus with neurological manifestations, uncontrolled ([place])   [place], Vernia Buff, NP   6 months ago Hospital discharge follow-up   [place], [place] W, NP               polyethylene glycol powder (GLYCOLAX/MIRALAX) 17 GM/SCOOP powder [Pharmacy Med Name: Polyethylene Glycol 3350 17 GM/SCOOP Powder] 582.8571 g 10    Sig: DISSOLVE 1 CAPFUL (17G) IN 8OZ OF LIQUID AND DRINK BY MOUTH AS NEEDED FOR CONSTIPATION     Gastroenterology:  Laxatives Passed - 12/22/2020  1:07 PM      Passed - Valid encounter within last 12 months    Recent Outpatient Visits           4 weeks ago Type 2 diabetes mellitus with diabetic polyneuropathy, with long-term current use of insulin [facility])   [place], Annie Main L, RPH-CPP   2 months ago Type 2 diabetes mellitus with diabetic polyneuropathy, with long-term current use of insulin ([place])   [place]  [place], [place] L, RPH-CPP   3 months ago Type 2 diabetes mellitus with diabetic polyneuropathy, with long-term current use of insulin [facility])   [place], [place] W, NP   6 months ago Diabetes mellitus with neurological manifestations, uncontrolled [facility])   [place] Gildardo Pounds, NP   6 months ago Hospital discharge follow-up   [place] Gildardo Pounds, NP

## 2020-12-23 NOTE — Telephone Encounter (Signed)
I called and left a message for the pt to call [facility] back and let us know which provider he was using [name] or Any Minette Brine?   Also which practice he is using [facility] or [place]?  It looks like he has established care with [name] at [facility].   We have 16 prescription refill requests for him.

## 2020-12-23 NOTE — Telephone Encounter (Addendum)
I called and left a message for the pt to call [facility] back and let us know which provider he was using [name] or Any Min[facility] Brine?   Also which practice he is using [facility] or [place]?  It looks like he has established care with [name] at [facility].   We have 16 prescription refill requests for him.    See refill encounter for 12/22/2020.

## 2020-12-23 NOTE — Telephone Encounter (Signed)
Requested medication (s) are due for refill today:  Please review  Requested medication (s) are on the active medication list:   Yes  Future visit scheduled:   No   Last ordered: Please review for refills pt has established care at [facility].      Requested Prescriptions  Pending Prescriptions Disp Refills   TRULICITY 0.93 GH/8.2XH SOPN [Pharmacy Med Name: Trulicity 3.71 IR/6.7EL Solution pen-injector] 2 mL 10    Sig: [place] ON SUNDAY     Endocrinology:  Diabetes - GLP-1 Receptor Agonists Failed - 12/22/2020  1:07 PM      Failed - HBA1C is between 0 and 7.9 and within 180 days    HbA1c, POC (controlled diabetic range)  Date Value Ref Range Status  09/23/2020 10.8 (A) 0.0 - 7.0 % Final          Passed - Valid encounter within last 6 months    Recent Outpatient Visits           4 weeks ago Type 2 diabetes mellitus with diabetic polyneuropathy, with long-term current use of insulin ([place])   [place], Annie Main L, RPH-CPP   2 months ago Type 2 diabetes mellitus with diabetic polyneuropathy, with long-term current use of insulin ([place])   [place], Jarome Matin, RPH-CPP   3 months ago Type 2 diabetes mellitus with diabetic polyneuropathy, with long-term current use of insulin ([place])   [place], [place] W, NP   6 months ago Diabetes mellitus with neurological manifestations, uncontrolled ([place])   [place], Vernia Buff, NP   6 months ago Hospital discharge follow-up   [place], [place] W, NP               Cholecalciferol (VITAMIN D3) 50 MCG (2000 UT) capsule [Pharmacy Med Name: Vitamin D3 50 MCG (2000 UT) Capsule] 30 capsule 10    Sig: TAKE [place] MORNING     Endocrinology:  Vitamins - Vitamin D Supplementation Failed - 12/22/2020  1:07 PM       Failed - 50,000 IU strengths are not delegated      Failed - Phosphate in normal range and within 360 days    No results found for: PHOS        Failed - Vitamin D in normal range and within 360 days    Vit D, 1,25-Dihydroxy  Date Value Ref Range Status  01/19/2008 13 (L) 30 - 89 Final    Comment:    See lab report for associated comment(s)   VITD  Date Value Ref Range Status  01/24/2019 14.82 (L) 30.00 - 100.00 ng/mL Final          Passed - Ca in normal range and within 360 days    Calcium  Date Value Ref Range Status  10/17/2020 8.9 8.6 - 10.3 mg/dL Final   Calcium, Ion  Date Value Ref Range Status  04/23/2011 1.17 1.12 - 1.32 mmol/L Final          Passed - Valid encounter within last 12 months    Recent Outpatient Visits           4 weeks ago Type 2 diabetes mellitus with diabetic polyneuropathy, with long-term current use of insulin [facility])   [place], Jarome Matin, RPH-CPP  2 months ago Type 2 diabetes mellitus with diabetic polyneuropathy, with long-term current use of insulin ([place])   [place], Jarome Matin, RPH-CPP   3 months ago Type 2 diabetes mellitus with diabetic polyneuropathy, with long-term current use of insulin [facility])   [place], Vernia Buff, NP   6 months ago Diabetes mellitus with neurological manifestations, uncontrolled ([place])   [place], Vernia Buff, NP   6 months ago Hospital discharge follow-up   [place], Vernia Buff, NP               gabapentin (NEURONTIN) 300 MG capsule [Pharmacy Med Name: Gabapentin 300 MG Capsule] 90 capsule 10    Sig: TAKE 1 CAPSULE BY MOUTH THREE TIMES DAILY     Neurology: Anticonvulsants - gabapentin Passed - 12/22/2020  1:07 PM      Passed - Valid encounter within last 12 months    Recent Outpatient Visits           4  weeks ago Type 2 diabetes mellitus with diabetic polyneuropathy, with long-term current use of insulin ([place])   [place], Jarome Matin, RPH-CPP   2 months ago Type 2 diabetes mellitus with diabetic polyneuropathy, with long-term current use of insulin ([place])   [place], Jarome Matin, RPH-CPP   3 months ago Type 2 diabetes mellitus with diabetic polyneuropathy, with long-term current use of insulin ([place])   [place], [place] W, NP   6 months ago Diabetes mellitus with neurological manifestations, uncontrolled ([place])   [place], Vernia Buff, NP   6 months ago Hospital discharge follow-up   [place], Zelda W, NP               B Complex-C (B-COMPLEX WITH VITAMIN C) tablet [Pharmacy Med Name: B Complex-C Tablet] 30 tablet 10    Sig: TAKE 1 TABLET EVERY MORNING     There is no refill protocol information for this order     amLODipine (NORVASC) 10 MG tablet [Pharmacy Med Name: amLODIPine Besylate 10 MG Tablet] 30 tablet 10    Sig: TAKE 1 TABLET EVERY MORNING     Cardiovascular:  Calcium Channel Blockers Failed - 12/22/2020  1:07 PM      Failed - Last BP in normal range    BP Readings from Last 1 Encounters:  11/26/20 (!) 165/92          Passed - Valid encounter within last 6 months    Recent Outpatient Visits           4 weeks ago Type 2 diabetes mellitus with diabetic polyneuropathy, with long-term current use of insulin ([place])   [place], Annie Main L, RPH-CPP   2 months ago Type 2 diabetes mellitus with diabetic polyneuropathy, with long-term current use of insulin [facility])   [place], Annie Main L, RPH-CPP   3 months ago Type 2 diabetes mellitus with diabetic polyneuropathy, with long-term current use of  insulin [facility])   [place], [place] W, NP   6 months ago Diabetes mellitus with neurological manifestations, uncontrolled [facility])   [place], Vernia Buff, NP  6 months ago Hospital discharge follow-up   [place], [place] W, NP               ACETAMINOPHEN EXTRA STRENGTH 500 MG tablet [Pharmacy Med Name: Acetaminophen Extra Strength 500 MG Tablet] 240 tablet 10    Sig: TAKE 2 TABLETS EVERY 6 HOURS AS NEEDED FOR PAIN     Over the Counter:  OTC Passed - 12/22/2020  1:07 PM      Passed - Valid encounter within last 12 months    Recent Outpatient Visits           4 weeks ago Type 2 diabetes mellitus with diabetic polyneuropathy, with long-term current use of insulin ([place])   [place], Jarome Matin, RPH-CPP   2 months ago Type 2 diabetes mellitus with diabetic polyneuropathy, with long-term current use of insulin ([place])   [place], Jarome Matin, RPH-CPP   3 months ago Type 2 diabetes mellitus with diabetic polyneuropathy, with long-term current use of insulin ([place])   [place], [place] W, NP   6 months ago Diabetes mellitus with neurological manifestations, uncontrolled ([place])   [place], Vernia Buff, NP   6 months ago Hospital discharge follow-up   [place], Zelda W, NP               escitalopram (LEXAPRO) 20 MG tablet [Pharmacy Med Name: Escitalopram Oxalate 20 MG Tablet] 30 tablet 10    Sig: TAKE 1 TABLET BY MOUTH EVERY MORNING     Psychiatry:  Antidepressants - SSRI Passed - 12/22/2020  1:07 PM      Passed - Completed PHQ-2 or PHQ-9 in the last 360 days      Passed - Valid encounter within last 6 months    Recent Outpatient Visits           4 weeks ago Type 2 diabetes  mellitus with diabetic polyneuropathy, with long-term current use of insulin ([place])   [place], Annie Main L, RPH-CPP   2 months ago Type 2 diabetes mellitus with diabetic polyneuropathy, with long-term current use of insulin [facility])   [place], Annie Main L, RPH-CPP   3 months ago Type 2 diabetes mellitus with diabetic polyneuropathy, with long-term current use of insulin [facility])   [place], [place] W, NP   6 months ago Diabetes mellitus with neurological manifestations, uncontrolled ([place])   [place], Vernia Buff, NP   6 months ago Hospital discharge follow-up   [place], [place] W, NP               ASPIRIN ADULT LOW STRENGTH 81 MG EC tablet [Pharmacy Med Name: Aspirin Adult Low Strength 81 MG Tablet delayed release] 30 tablet 10    Sig: TAKE 1 TABLET BY MOUTH EVERY MORNING     Analgesics:  NSAIDS - aspirin Passed - 12/22/2020  1:07 PM      Passed - Patient is not pregnant      Passed - Valid encounter within last 12 months    Recent Outpatient Visits           4 weeks ago Type 2 diabetes mellitus with diabetic polyneuropathy, with long-term current  use of insulin [facility])   [place], Jarome Matin, RPH-CPP   2 months ago Type 2 diabetes mellitus with diabetic polyneuropathy, with long-term current use of insulin [facility])   [place], Annie Main L, RPH-CPP   3 months ago Type 2 diabetes mellitus with diabetic polyneuropathy, with long-term current use of insulin [facility])   [place], [place] W, NP   6 months ago Diabetes mellitus with neurological manifestations, uncontrolled ([place])   [place], Vernia Buff, NP   6 months ago Hospital discharge  follow-up   [place], [place] W, NP               cloNIDine (CATAPRES) 0.1 MG tablet [Pharmacy Med Name: cloNIDine HCl 0.1 MG Tablet] 60 tablet 10    Sig: TAKE 1 TABLET BY MOUTH TWICE DAILY     Cardiovascular:  Alpha-2 Agonists Failed - 12/22/2020  1:07 PM      Failed - Last BP in normal range    BP Readings from Last 1 Encounters:  11/26/20 (!) 165/92          Passed - Last Heart Rate in normal range    Pulse Readings from Last 1 Encounters:  11/26/20 94          Passed - Valid encounter within last 6 months    Recent Outpatient Visits           4 weeks ago Type 2 diabetes mellitus with diabetic polyneuropathy, with long-term current use of insulin ([place])   [place], Annie Main L, RPH-CPP   2 months ago Type 2 diabetes mellitus with diabetic polyneuropathy, with long-term current use of insulin [facility])   [place], Annie Main L, RPH-CPP   3 months ago Type 2 diabetes mellitus with diabetic polyneuropathy, with long-term current use of insulin [facility])   [place], [place] W, NP   6 months ago Diabetes mellitus with neurological manifestations, uncontrolled ([place])   [place], Vernia Buff, NP   6 months ago Hospital discharge follow-up   [place], NP               insulin glargine-yfgn (SEMGLEE) 100 UNIT/ML Pen [Pharmacy Med Name: Insulin Glargine-yfgn 100 UNIT/ML Solution pen-injector] 9 mL 10    Sig: INJECT 15MLS SUBCUTANEOUSLY TWICE DAILY     Off-Protocol Failed - 12/22/2020  1:07 PM      Failed - Medication not assigned to a protocol, review manually.      Passed - Valid encounter within last 12 months    Recent Outpatient Visits           4 weeks ago Type 2 diabetes mellitus with diabetic polyneuropathy, with long-term  current use of insulin [facility])   [place], Annie Main L, RPH-CPP   2 months ago Type 2 diabetes mellitus with diabetic polyneuropathy, with long-term current use of insulin [facility])   [place], Annie Main L, RPH-CPP   3 months ago Type 2 diabetes mellitus with diabetic polyneuropathy, with long-term current use of insulin [facility])   [place], NP   6 months ago Diabetes  mellitus with neurological manifestations, uncontrolled ([place])   [place], Vernia Buff, NP   6 months ago Hospital discharge follow-up   [place], [place] W, NP               insulin lispro (HUMALOG) 100 UNIT/ML injection [Pharmacy Med Name: Insulin Lispro 100 UNIT/ML Solution] 6 mL 10    Sig: PER [place]     Endocrinology:  Diabetes - Insulins Failed - 12/22/2020  1:07 PM      Failed - HBA1C is between 0 and 7.9 and within 180 days    HbA1c, POC (controlled diabetic range)  Date Value Ref Range Status  09/23/2020 10.8 (A) 0.0 - 7.0 % Final          Passed - Valid encounter within last 6 months    Recent Outpatient Visits           4 weeks ago Type 2 diabetes mellitus with diabetic polyneuropathy, with long-term current use of insulin ([place])   [place], Jarome Matin, RPH-CPP   2 months ago Type 2 diabetes mellitus with diabetic polyneuropathy, with long-term current use of insulin ([place])   [place], Jarome Matin, RPH-CPP   3 months ago Type 2 diabetes mellitus with diabetic polyneuropathy, with long-term current use of insulin ([place])   [place], Vernia Buff, NP   6 months ago Diabetes mellitus with neurological manifestations, uncontrolled ([place])   [place], Vernia Buff, NP   6 months ago Hospital discharge follow-up   [place], [place], NP               ferrous sulfate 325 (65 FE) MG EC tablet [Pharmacy Med Name: Ferrous Sulfate 325 (65 Fe) MG Tablet delayed release] 30 tablet 10    Sig: TAKE 1 TABLET BY [place] MORNING     Endocrinology:  Minerals - Iron Supplementation Failed - 12/22/2020  1:07 PM      Failed - HGB in normal range and within 360 days    Hemoglobin  Date Value Ref Range Status  10/17/2020 9.7 (L) 13.2 - 17.1 g/dL Final  10/14/2020 10.4 (L) 13.0 - 17.0 g/dL Final  06/24/2020 10.8 (L) 13.0 - 17.7 g/dL Final          Failed - HCT in normal range and within 360 days    HCT  Date Value Ref Range Status  10/17/2020 30.8 (L) 38.5 - 50.0 % Final   Hematocrit  Date Value Ref Range Status  06/24/2020 33.5 (L) 37.5 - 51.0 % Final          Failed - RBC in normal range and within 360 days    RBC  Date Value Ref Range Status  10/17/2020 3.67 (L) 4.20 - 5.80 Million/uL Final          Passed - Fe (serum) in normal range and within 360 days    Iron  Date Value Ref Range Status  07/04/2020 45 42 - 163 ug/dL Final   Saturation Ratios  Date Value Ref Range Status  07/04/2020 17 (L) 20 - 55 % Final          Passed - Ferritin in normal range and within 360 days    Ferritin  Date Value Ref Range Status  07/04/2020  176 24 - 336 ng/mL Final    Comment:    Performed at [facility], [place] [address] [place], [place] [address]          Passed - Valid encounter within last 12 months    Recent Outpatient Visits           4 weeks ago Type 2 diabetes mellitus with diabetic polyneuropathy, with long-term current use of insulin [facility])   [place], Annie Main L, RPH-CPP   2 months ago Type 2 diabetes mellitus with diabetic polyneuropathy, with long-term current use of insulin ([place])   [place], Annie Main L, RPH-CPP   3 months ago Type 2 diabetes mellitus with diabetic polyneuropathy, with long-term current use of insulin ([place])   [place], [place] W, NP   6 months ago Diabetes mellitus with neurological manifestations, uncontrolled ([place])   [place] Gildardo Pounds, NP   6 months ago Hospital discharge follow-up   [place], [place] W, NP               carvedilol (COREG) 25 MG tablet [Pharmacy Med Name: Carvedilol 25 MG Tablet] 60 tablet 10    Sig: TAKE 1 TABLET TWICE DAILY     Cardiovascular:  Beta Blockers Failed - 12/22/2020  1:07 PM      Failed - Last BP in normal range    BP Readings from Last 1 Encounters:  11/26/20 (!) 165/92          Passed - Last Heart Rate in normal range    Pulse Readings from Last 1 Encounters:  11/26/20 94          Passed - Valid encounter within last 6 months    Recent Outpatient Visits           4 weeks ago Type 2 diabetes mellitus with diabetic polyneuropathy, with long-term current use of insulin ([place])   [place], Annie Main L, RPH-CPP   2 months ago Type 2 diabetes mellitus with diabetic polyneuropathy, with long-term current use of insulin ([place])   [place], Annie Main L, RPH-CPP   3 months ago Type 2 diabetes mellitus with diabetic polyneuropathy, with long-term current use of insulin ([place])   [place], [place] W, NP   6 months ago Diabetes mellitus with neurological manifestations, uncontrolled ([place])   [place], [place] W, NP   6 months ago Hospital discharge follow-up   [place], [place] W, NP               atorvastatin (LIPITOR) 20 MG tablet [Pharmacy Med Name: Atorvastatin Calcium 20  MG Tablet] 30 tablet 10    Sig: TAKE 1 TABLET BY MOUTH DAILY AT 9:00 AM     Cardiovascular:  Antilipid - Statins Failed - 12/22/2020  1:07 PM      Failed - Total Cholesterol in normal range and within 360 days    Cholesterol, Total  Date Value Ref Range Status  10/10/2019 97 (L) 100 - 199 mg/dL Final          Failed - LDL in normal range and within 360 days    LDL Chol Calc (NIH)  Date Value Ref Range  Status  10/10/2019 39 0 - 99 mg/dL Final          Failed - HDL in normal range and within 360 days    HDL  Date Value Ref Range Status  10/10/2019 47 >39 mg/dL Final          Failed - Triglycerides in normal range and within 360 days    Triglycerides  Date Value Ref Range Status  10/10/2019 43 0 - 149 mg/dL Final          Passed - Patient is not pregnant      Passed - Valid encounter within last 12 months    Recent Outpatient Visits           4 weeks ago Type 2 diabetes mellitus with diabetic polyneuropathy, with long-term current use of insulin ([place])   [place], Annie Main L, RPH-CPP   2 months ago Type 2 diabetes mellitus with diabetic polyneuropathy, with long-term current use of insulin ([place])   [place]oz[place], Annie Main L, RPH-CPP   3 months ago Type 2 diabetes mellitus with diabetic polyneuropathy, with long-term current use of insulin ([place])   [place], [place] W, NP   6 months ago Diabetes mellitus with neurological manifestations, uncontrolled ([place])   [place], Vernia Buff, NP   6 months ago Hospital discharge follow-up   [place], [place] W, NP               senna (SENOKOT) 8.6 MG TABS tablet [Pharmacy Med Name: Senna 8.6 MG Tablet] 30 tablet 10    Sig: TAKE 1 TABLET BY MOUTH DAILY AS NEEDED FOR CONSTIPATION     Over the Counter:  OTC Passed - 12/22/2020  1:07 PM       Passed - Valid encounter within last 12 months    Recent Outpatient Visits           4 weeks ago Type 2 diabetes mellitus with diabetic polyneuropathy, with long-term current use of insulin ([name])   [place], Annie Main L, RPH-CPP   2 months ago Type 2 diabetes mellitus with diabetic polyneuropathy, with long-term current use of insulin ([place])   [place], Annie Main L, RPH-CPP   3 months ago Type 2 diabetes mellitus with diabetic polyneuropathy, with long-term current use of insulin [facility])   [place], [place] W, NP   6 months ago Diabetes mellitus with neurological manifestations, uncontrolled ([place])   [place] Kiefer, Vernia Buff, NP   6 months ago Hospital discharge follow-up   [place], [place] W, NP               polyethylene glycol powder (GLYCOLAX/MIRALAX) 17 GM/SCOOP powder [Pharmacy Med Name: Polyethylene Glycol 3350 17 GM/SCOOP Powder] 582.8571 g 10    Sig: DISSOLVE 1 CAPFUL (17G) IN 8OZ OF LIQUID AND DRINK BY MOUTH AS NEEDED FOR CONSTIPATION     Gastroenterology:  Laxatives Passed - 12/22/2020  1:07 PM      Passed - Valid encounter within last 12 months    Recent Outpatient Visits           4 weeks ago Type 2 diabetes mellitus with diabetic polyneuropathy, with long-term current use of insulin ([place])  [place], Jarome Matin, RPH-CPP   2 months ago Type 2 diabetes mellitus with diabetic polyneuropathy, with long-term current use of insulin [facility])   [place], Annie Main L, RPH-CPP   3 months ago Type 2 diabetes mellitus with diabetic polyneuropathy, with long-term current use of insulin [facility])   [place], [place] W, NP   6 months ago Diabetes mellitus with  neurological manifestations, uncontrolled [facility])   [place] Gildardo Pounds, NP   6 months ago Hospital discharge follow-up   [place] Gildardo Pounds, NP

## 2020-12-23 NOTE — Telephone Encounter (Signed)
I attempted to call pt again to clarify which provider and practice he prefers and is using.  Left voicemail to call [facility] back for clarification.

## 2020-12-24 ENCOUNTER — Ambulatory Visit: Payer: Medicaid Other | Admitting: Nurse Practitioner

## 2020-12-24 ENCOUNTER — Encounter (HOSPITAL_BASED_OUTPATIENT_CLINIC_OR_DEPARTMENT_OTHER): Payer: 59 | Admitting: Physician Assistant

## 2020-12-24 DIAGNOSIS — E11621 Type 2 diabetes mellitus with foot ulcer: Secondary | ICD-10-CM | POA: Diagnosis not present

## 2020-12-24 LAB — GLUCOSE, CAPILLARY
Glucose-Capillary: 146 mg/dL — ABNORMAL HIGH (ref 70–99)
Glucose-Capillary: 188 mg/dL — ABNORMAL HIGH (ref 70–99)

## 2020-12-24 NOTE — Progress Notes (Addendum)
DYRELL, TUCCILLO ([id number]) Visit Report for 12/24/2020 Arrival Information Details Patient Name: Date of Service: [address] RD, [name] 12/24/2020 10:00 A M Medical Record Number: [id number] Patient Account Number: [id number] Date of Birth/Sex: Treating RN: [birth year] (50 y.o. [name], [name] Primary Care Romanda Turrubiates: [name] Other Clinician: [name] Referring Rafik Koppel: Treating Arli Bree/Extender: [name], [name] in Treatment: 15 Visit Information History Since Last Visit All ordered tests and consults were completed: Yes Patient Arrived: Cane Added or deleted any medications: No Arrival Time: 09:40 Any new allergies or adverse reactions: No Accompanied By: self Had a fall or experienced change in No Transfer Assistance: None activities of daily living that may affect Patient Identification Verified: Yes risk of falls: Secondary Verification Process Completed: Yes Signs or symptoms of abuse/neglect since last visito No Patient Requires Transmission-Based Precautions: No Hospitalized since last visit: No Patient Has Alerts: No Implantable device outside of the clinic excluding No cellular tissue based products placed in the center since last visit: Pain Present Now: No Electronic Signature(s) Signed: 12/24/2020 11:25:12 AM By: [name] EMT Entered By: [name] on 12/24/2020 09:55:14 -------------------------------------------------------------------------------- Encounter Discharge Information Details Patient Name: Date of Service: [name], [place]. 12/24/2020 10:00 A M Medical Record Number: [id number] Patient Account Number: [id number] Date of Birth/Sex: Treating RN: 1970-07-09 (50 y.o. [name] Primary Care Jemeka Wagler: [name] Other Clinician: [name] Referring Charnell Peplinski: Treating Miley Blanchett/Extender: [name], [name] in Treatment: 15 Encounter Discharge Information  Items Discharge Condition: Stable Ambulatory Status: Cane Discharge Destination: Home Transportation: Private Auto Accompanied By: self Schedule Follow-up Appointment: No Clinical Summary of Care: Electronic Signature(s) Signed: 12/24/2020 3:49:49 PM By: [name] EMT Entered By: [name] on 12/24/2020 15:49:48 -------------------------------------------------------------------------------- Vitals Details Patient Name: Date of Service: [name], [place]. 12/24/2020 10:00 A M Medical Record Number: [id number] Patient Account Number: [id number] Date of Birth/Sex: Treating RN: [birth year] (50 y.o. [name] Primary Care Valmai Vandenberghe: [name] Other Clinician: [name] Referring Azarria Balint: Treating Tevion Laforge/Extender: [name], [name] in Treatment: 15 Vital Signs Time Taken: 09:46 Temperature (F): 97.5 Height (in): 64 Pulse (bpm): 96 Weight (lbs): 188 Respiratory Rate (breaths/min): 20 Body Mass Index (BMI): 32.3 Blood Pressure (mmHg): 140/83 Capillary Blood Glucose (mg/dl): 146 Reference Range: 80 - 120 mg / dl Electronic Signature(s) Signed: 12/24/2020 11:25:12 AM By: [name] EMT Entered By: [name] on 12/24/2020 10:09:59

## 2020-12-24 NOTE — Progress Notes (Signed)
KIREN, MCISAAC ([id number]) Visit Report for 12/23/2020 Arrival Information Details Patient Name: Date of Service: [place] [address], [name] 12/23/2020 10:00 A M Medical Record Number: [id number] Patient Account Number: [id number] Date of Birth/Sex: Treating RN: Jul 13, 1970 (50 y.o. [name], [name] Primary Care Jassen Sarver: [name] Other Clinician: [name] Referring Mayumi Summerson: Treating Amberleigh Gerken/Extender: [name], [name] in Treatment: 15 Visit Information History Since Last Visit All ordered tests and consults were completed: Yes Patient Arrived: [name] Added or deleted any medications: No Arrival Time: 09:48 Any new allergies or adverse reactions: No Accompanied By: self Had a fall or experienced change in No Transfer Assistance: None activities of daily living that may affect Patient Identification Verified: Yes risk of falls: Secondary Verification Process Completed: Yes Signs or symptoms of abuse/neglect since last visito No Patient Requires Transmission-Based Precautions: No Hospitalized since last visit: No Patient Has Alerts: No Implantable device outside of the clinic excluding No cellular tissue based products placed in the center since last visit: Pain Present Now: No Electronic Signature(s) Signed: 12/24/2020 11:07:05 AM By: [name] EMT Entered By: [name] on 12/23/2020 11:41:20 -------------------------------------------------------------------------------- Encounter Discharge Information Details Patient Name: Date of Service: [name], [place]. 12/23/2020 10:00 A M Medical Record Number: [id number] Patient Account Number: [id number] Date of Birth/Sex: Treating RN: 06-20-1970 (50 y.o. [name], [name] Primary Care Reyli Schroth: [name] Other Clinician: [name] Referring Rori Goar: Treating Tayla Panozzo/Extender: [name], [name] in Treatment: 15 Encounter Discharge Information  Items Discharge Condition: Stable Ambulatory Status: Cane Discharge Destination: Home Transportation: Private Auto Accompanied By: self Schedule Follow-up Appointment: No Clinical Summary of Care: Electronic Signature(s) Signed: 12/24/2020 11:07:05 AM By: [name] EMT Entered By: [name] on 12/23/2020 13:15:04 -------------------------------------------------------------------------------- Patient/Caregiver Education Details Patient Name: Date of Service: [name], [name] 9/27/2022andnbsp10:00 A M Medical Record Number: [id number] Patient Account Number: [id number] Date of Birth/Gender: Treating RN: 10-16-1970 (50 y.o. [name], [name] Primary Care Physician: [name] Other Clinician: [name] Referring Physician: Treating Physician/Extender: Bobbye Charleston in Treatment: 15 Education Assessment Education Provided To: Patient Education Topics Provided Hyperbaric Oxygenation: Methods: Explain/Verbal Responses: State content correctly Electronic Signature(s) Signed: 12/24/2020 11:07:05 AM By: [name] EMT Entered By: [name] on 12/23/2020 13:14:48 -------------------------------------------------------------------------------- Vitals Details Patient Name: Date of Service: [name], [name] 12/23/2020 10:00 A M Medical Record Number: [id number] Patient Account Number: [id number] Date of Birth/Sex: Treating RN: 10/07/1970 (50 y.o. [name], [name] Primary Care Mckenzey Parcell: [name] Other Clinician: [name] Referring Keiosha Cancro: Treating Chasidy Janak/Extender: [name], [name] in Treatment: 15 Vital Signs Time Taken: 09:53 Temperature (F): 98.0 Height (in): 64 Pulse (bpm): 97 Weight (lbs): 188 Respiratory Rate (breaths/min): 20 Body Mass Index (BMI): 32.3 Blood Pressure (mmHg): 162/98 Capillary Blood Glucose (mg/dl): 127 Reference Range: 80 - 120 mg /  dl Electronic Signature(s) Signed: 12/24/2020 11:07:05 AM By: [name] EMT Entered By: [name] on 12/23/2020 11:43:49

## 2020-12-24 NOTE — Progress Notes (Signed)
[name], [name] ([id number]) Visit Report for 12/23/2020 SuperBill Details Patient Name: Date of Service: [name], [name] 12/23/2020 Medical Record Number: [id number] Patient Account Number: [id number] Date of Birth/Sex: Treating RN: [birth year] (50 y.o. [name], [name] Primary Care Provider: [name] Other Clinician: [name] Referring Provider: Treating Provider/Extender: [name], [name] in Treatment: 15 Diagnosis Coding ICD-10 Codes Code Description E11.621 Type 2 diabetes mellitus with foot ulcer L97.514 Non-pressure chronic ulcer of other part of right foot with necrosis of bone T81.31XS Disruption of external operation (surgical) wound, not elsewhere classified, sequela L97.528 Non-pressure chronic ulcer of other part of left foot with other specified severity F17.218 Nicotine dependence, cigarettes, with other nicotine-induced disorders Facility Procedures CPT4 Code Description Modifier Quantity [id number] G0277-(Facility Use Only) HBOT full body chamber, 71min , 5 ICD-10 Diagnosis Description E11.621 Type 2 diabetes mellitus with foot ulcer L97.514 Non-pressure chronic ulcer of other part of right foot with necrosis of bone T81.31XS Disruption of external operation (surgical) wound, not elsewhere classified, sequela L97.528 Non-pressure chronic ulcer of other part of left foot with other specified severity Physician Procedures Quantity CPT4 Code Description Modifier 5170017 49449 - WC PHYS HYPERBARIC OXYGEN THERAPY 1 ICD-10 Diagnosis Description E11.621 Type 2 diabetes mellitus with foot ulcer L97.514 Non-pressure chronic ulcer of other part of right foot with necrosis of bone T81.31XS Disruption of external operation (surgical) wound, not elsewhere classified, sequela L97.528 Non-pressure chronic ulcer of other part of left foot with other specified severity Electronic Signature(s) Signed: 12/23/2020 4:21:38 PM By: [name] MD Signed: 12/24/2020 11:07:05 AM By: [name] EMT Entered By: [name] on 12/23/2020 13:13:39

## 2020-12-24 NOTE — Progress Notes (Signed)
DAO, MEMMOTT ([id number]) Visit Report for 12/23/2020 HBO Details Patient Name: Date of Service: [place] [name], [name] 12/23/2020 10:00 A M Medical Record Number: [id number] Patient Account Number: [id number] Date of Birth/Sex: Treating RN: 07-03-1970 (50 y.o. [name], [name] Primary Care Zandra Lajeunesse: [name] Other Clinician: [name] Referring Gerrad Welker: Treating Clint Strupp/Extender: [name], [name] in Treatment: 15 HBO Treatment Course Details Treatment Course Number: 1 Ordering Timathy Newberry: [name] Treatments Ordered: otal 40 HBO Treatment Start Date: 12/08/2020 HBO Indication: Diabetic Ulcer(s) of the Lower Extremity HBO Treatment Details Treatment Number: 1 Patient Type: Outpatient Chamber Type: Monoplace Chamber Serial #: [id number] Treatment Protocol: 2.5 ATA with 90 minutes oxygen, with two 5 minute air breaks Treatment Details Compression Rate Down: 1.0 psi / minute De-Compression Rate Up: 1.0 psi / minute A breaks and breathing ir Compress Tx Pressure periods Decompress Decompress Begins Reached (leave unused spaces Begins Ends blank) Chamber Pressure (ATA 1 2.5 2.5 2.5 2.5 2.5 - - 2.5 1 ) Clock Time (24 hr) 10:29 10:53 11:23 11:28 11:58 12:03 - - 12:33 12:50 Treatment Length: 141 (minutes) Treatment Segments: 5 Vital Signs Capillary Blood Glucose Reference Range: 80 - 120 mg / dl HBO Diabetic Blood Glucose Intervention Range: <131 mg/dl or >249 mg/dl Type: Time Vitals Blood Pulse: Respiratory Temperature: Capillary Blood Glucose Pulse Action Taken: Pressure: Rate: Glucose (mg/dl): Meter #: Oximetry (%) Taken: Pre 09:53 162/98 97 20 98 127 Post 12:52 124/85 90 18 97.7 103 discharge per protocol >101 mg/dL Treatment Response Treatment Toleration: Well Treatment Completion Status: Treatment Completed without Adverse Event Additional Procedure Documentation Tissue Sevierity: Fat layer exposed Jemma Rasp Notes Patient  returned after a hiatus. Surprisingly did well during this treatment. No concerns were raised Physician HBO Attestation: I certify that I supervised this HBO treatment in accordance with Medicare guidelines. A trained emergency response team is readily available per Yes hospital policies and procedures. Continue HBOT as ordered. Yes Electronic Signature(s) Signed: 12/23/2020 4:21:38 PM By: [name] MD Entered By: [name] on 12/23/2020 16:20:35 -------------------------------------------------------------------------------- HBO Safety Checklist Details Patient Name: Date of Service: [name], [place] [name] 12/23/2020 10:00 A M Medical Record Number: [id number] Patient Account Number: [id number] Date of Birth/Sex: Treating RN: [birth year] (50 y.o. [name], [name] Primary Care Deolinda Frid: [name] Other Clinician: [name] Referring Vahan Wadsworth: Treating George Haggart/Extender: [name], [name] in Treatment: 15 HBO Safety Checklist Items Safety Checklist Consent Form Signed Patient voided / foley secured and emptied When did you last eato 0000 overnight Last dose of injectable or oral agent 0830 Ostomy pouch emptied and vented if applicable NA All implantable devices assessed, documented and approved NA Intravenous access site secured and place NA Valuables secured Linens and cotton and cotton/polyester blend (less than 51% polyester) Personal oil-based products / skin lotions / body lotions removed Wigs or hairpieces removed NA Smoking or tobacco materials removed NA Books / newspapers / magazines / loose paper removed NA Cologne, aftershave, perfume and deodorant removed Jewelry removed (may wrap wedding band) Make-up removed NA Hair care products removed NA Battery operated devices (external) removed Heating patches and chemical warmers removed Titanium eyewear removed NA Nail polish cured greater than 10 hours NA Casting  material cured greater than 10 hours NA Hearing aids removed Loose dentures or partials removed Prosthetics have been removed NA Patient demonstrates correct use of air break device (if applicable) Patient concerns have been addressed Patient grounding bracelet on and cord attached to chamber Specifics for Inpatients (complete  in addition to above) Medication sheet sent with patient NA Intravenous medications needed or due during therapy sent with patient NA Drainage tubes (e.g. nasogastric tube or chest tube secured and vented) NA Endotracheal or Tracheotomy tube secured NA Cuff deflated of air and inflated with saline NA Airway suctioned NA Notes Paper version of the safety checklist was used prior to treatment start. Data entered afterward. Electronic Signature(s) Signed: 12/24/2020 11:07:05 AM By: [name] EMT Entered By: [name] on 12/23/2020 12:15:47

## 2020-12-25 ENCOUNTER — Encounter (HOSPITAL_BASED_OUTPATIENT_CLINIC_OR_DEPARTMENT_OTHER): Payer: 59 | Admitting: Internal Medicine

## 2020-12-25 ENCOUNTER — Other Ambulatory Visit: Payer: Self-pay

## 2020-12-25 DIAGNOSIS — E11621 Type 2 diabetes mellitus with foot ulcer: Secondary | ICD-10-CM | POA: Diagnosis not present

## 2020-12-25 LAB — GLUCOSE, CAPILLARY
Glucose-Capillary: 166 mg/dL — ABNORMAL HIGH (ref 70–99)
Glucose-Capillary: 141 mg/dL — ABNORMAL HIGH (ref 70–99)

## 2020-12-25 NOTE — Progress Notes (Addendum)
[name], [name] ([id number]) Visit Report for 12/25/2020 Arrival Information Details Patient Name: Date of Service: [name] RD, [name] Joseph J. 12/25/2020 8:30 A M Medical Record Number: [id number] Patient Account Number: [id number] Date of Birth/Sex: Treating RN: 25-Apr-1970 (50 y.o. [name], [name] Primary Care [name]: [name] Other Clinician: Donavan [name] Referring [name]: Treating [name]/Extender: [name], [name] in Treatment: 15 Visit Information History Since Last Visit All ordered tests and consults were completed: Yes Patient Arrived: Cane Added or deleted any medications: No Arrival Time: 08:34 Any new allergies or adverse reactions: No Accompanied By: self Had a fall or experienced change in No Transfer Assistance: None activities of daily living that may affect Patient Identification Verified: Yes risk of falls: Secondary Verification Process Completed: Yes Signs or symptoms of abuse/neglect since last visito No Patient Requires Transmission-Based Precautions: No Hospitalized since last visit: No Patient Has Alerts: No Implantable device outside of the clinic excluding No cellular tissue based products placed in the center since last visit: Pain Present Now: No Electronic Signature(s) Signed: 12/25/2020 12:09:10 PM By: [name] EMT Entered By: [name] on 12/25/2020 11:18:01 -------------------------------------------------------------------------------- Encounter Discharge Information Details Patient Name: Date of Service: [name] RD, [place] Joseph J. 12/25/2020 8:30 A M Medical Record Number: [id number] Patient Account Number: [id number] Date of Birth/Sex: Treating RN: 09-02-1970 (51 y.o. [name] Primary Care [name]: [name] Other Clinician: Donavan [name] Referring [name]: Treating [name]/Extender: [name], [name] in Treatment: 15 Encounter Discharge Information  Items Discharge Condition: Stable Ambulatory Status: Cane Discharge Destination: Home Transportation: Private Auto Accompanied By: self Schedule Follow-up Appointment: No Clinical Summary of Care: Electronic Signature(s) Signed: 12/25/2020 12:09:10 PM By: [name] EMT Entered By: [name] on 12/25/2020 11:22:31 -------------------------------------------------------------------------------- Patient/Caregiver Education Details Patient Name: Date of Service: [name] RD, [place] Joseph [name] 9/29/2022andnbsp8:30 A M Medical Record Number: [id number] Patient Account Number: [id number] Date of Birth/Gender: Treating RN: 1970/05/29 (50 y.o. [name] Primary Care Physician: [name] Other Clinician: Donavan [name] Referring Physician: Treating Physician/Extender: [name] in Treatment: 15 Education Assessment Education Provided To: Patient Education Topics Provided Smoking and Wound Healing: Methods: Explain/Verbal Responses: Reinforcements needed Notes Instructed patient to not smoke prior to hyperbaric treatments. Shared information how to quit and/or reduce smoking. Electronic Signature(s) Signed: 12/25/2020 1:20:13 PM By: [name] EMT Entered By: [name] on 12/25/2020 12:20:17 -------------------------------------------------------------------------------- Vitals Details Patient Name: Date of Service: [name] RD, [place] Joseph J. 12/25/2020 8:30 A M Medical Record Number: [id number] Patient Account Number: [id number] Date of Birth/Sex: Treating RN: 06-13-1970 (50 y.o. [name], [name] Primary Care [name]: [name] Other Clinician: Donavan [name] Referring [name]: Treating [name]/Extender: [name], [name] in Treatment: 15 Vital Signs Time Taken: 08:38 Temperature (F): 97.5 Height (in): 64 Pulse (bpm): 98 Weight (lbs): 188 Respiratory Rate (breaths/min): 21 Body Mass Index  (BMI): 32.3 Blood Pressure (mmHg): 146/100 Capillary Blood Glucose (mg/dl): 166 Reference Range: 80 - 120 mg / dl Electronic Signature(s) Signed: 12/25/2020 12:09:10 PM By: [name] EMT Entered By: [name] on 12/25/2020 09:43:26

## 2020-12-26 ENCOUNTER — Ambulatory Visit (INDEPENDENT_AMBULATORY_CARE_PROVIDER_SITE_OTHER): Payer: 59 | Admitting: Podiatry

## 2020-12-26 ENCOUNTER — Ambulatory Visit: Payer: Medicaid Other | Admitting: Podiatry

## 2020-12-26 ENCOUNTER — Encounter (HOSPITAL_BASED_OUTPATIENT_CLINIC_OR_DEPARTMENT_OTHER): Payer: 59 | Admitting: Internal Medicine

## 2020-12-26 DIAGNOSIS — L97521 Non-pressure chronic ulcer of other part of left foot limited to breakdown of skin: Secondary | ICD-10-CM | POA: Diagnosis not present

## 2020-12-26 DIAGNOSIS — E11621 Type 2 diabetes mellitus with foot ulcer: Secondary | ICD-10-CM | POA: Diagnosis not present

## 2020-12-26 DIAGNOSIS — L97514 Non-pressure chronic ulcer of other part of right foot with necrosis of bone: Secondary | ICD-10-CM

## 2020-12-26 LAB — GLUCOSE, CAPILLARY
Glucose-Capillary: 116 mg/dL — ABNORMAL HIGH (ref 70–99)
Glucose-Capillary: 138 mg/dL — ABNORMAL HIGH (ref 70–99)
Glucose-Capillary: 133 mg/dL — ABNORMAL HIGH (ref 70–99)

## 2020-12-26 NOTE — Progress Notes (Signed)
DA, AUTHEMENT ([id number]) Visit Report for 12/24/2020 SuperBill Details Patient Name: Date of Service: [place] RD, [name] 12/24/2020 Medical Record Number: [id number] Patient Account Number: [id number] Date of Birth/Sex: Treating RN: 03-13-1971 (50 y.o. [name] Primary Care Provider: [name] Other Clinician: [name] Referring Provider: Treating Provider/Extender: [name], [name] in Treatment: 15 Diagnosis Coding ICD-10 Codes Code Description E11.621 Type 2 diabetes mellitus with foot ulcer L97.514 Non-pressure chronic ulcer of other part of right foot with necrosis of bone T81.31XS Disruption of external operation (surgical) wound, not elsewhere classified, sequela L97.528 Non-pressure chronic ulcer of other part of left foot with other specified severity F17.218 Nicotine dependence, cigarettes, with other nicotine-induced disorders Facility Procedures CPT4 Code Description Modifier Quantity [id number] G0277-(Facility Use Only) HBOT full body chamber, 80min , 4 ICD-10 Diagnosis Description E11.621 Type 2 diabetes mellitus with foot ulcer L97.514 Non-pressure chronic ulcer of other part of right foot with necrosis of bone T81.31XS Disruption of external operation (surgical) wound, not elsewhere classified, sequela L97.528 Non-pressure chronic ulcer of other part of left foot with other specified severity Physician Procedures Quantity CPT4 Code Description Modifier [id number] 98338 - WC PHYS HYPERBARIC OXYGEN THERAPY 1 ICD-10 Diagnosis Description E11.621 Type 2 diabetes mellitus with foot ulcer L97.514 Non-pressure chronic ulcer of other part of right foot with necrosis of bone T81.31XS Disruption of external operation (surgical) wound, not elsewhere classified, sequela L97.528 Non-pressure chronic ulcer of other part of left foot with other specified severity Electronic Signature(s) Signed: 12/24/2020 3:49:28 PM By: [name] EMT Signed: 12/26/2020 4:42:31 PM By: Worthy Keeler PA-C Entered By: [name] on 12/24/2020 15:49:27

## 2020-12-26 NOTE — Progress Notes (Signed)
MOHAN, ERVEN ([id number]) Visit Report for 12/24/2020 HBO Details Patient Name: Date of Service: [place] [address], [name] 12/24/2020 10:00 A M Medical Record Number: [id number] Patient Account Number: [id number] Date of Birth/Sex: Treating RN: 1971-01-09 (50 y.o. [name] Primary Care Lawerence Dery: [name] Other Clinician: [name] Referring Blia Totman: Treating Britanee Vanblarcom/Extender: [name], [name] in Treatment: 15 HBO Treatment Course Details Treatment Course Number: 1 Ordering Nafis Farnan: [name] Treatments Ordered: otal 40 HBO Treatment Start Date: 12/08/2020 HBO Indication: Diabetic Ulcer(s) of the Lower Extremity HBO Treatment Details Treatment Number: 2 Patient Type: Outpatient Chamber Type: Monoplace Chamber Serial #: [id number] Treatment Protocol: 2.5 ATA with 90 minutes oxygen, with two 5 minute air breaks Treatment Details Compression Rate Down: 1.0 psi / minute De-Compression Rate Up: 1.5 psi / minute A breaks and breathing ir Compress Tx Pressure periods Decompress Decompress Begins Reached (leave unused spaces Begins Ends blank) Chamber Pressure (ATA 1 2.5 2.5 2.5 2.5 2.5 - - 2.5 1 ) Clock Time (24 hr) 10:06 10:29 10:59 11:04 11:34 11:39 - - 12:09 12:21 Treatment Length: 135 (minutes) Treatment Segments: 4 Vital Signs Capillary Blood Glucose Reference Range: 80 - 120 mg / dl HBO Diabetic Blood Glucose Intervention Range: <131 mg/dl or >249 mg/dl Time Vitals Blood Respiratory Capillary Blood Glucose Pulse Action Type: Pulse: Temperature: Taken: Pressure: Rate: Glucose (mg/dl): Meter #: Oximetry (%) Taken: Pre 09:46 140/83 96 20 97.5 146 2 Post 12:25 132/86 90 20 97.7 188 2 Treatment Response Treatment Toleration: Well Treatment Completion Status: Treatment Completed without Adverse Event Additional Procedure Documentation Tissue Sevierity: Fat layer exposed Electronic Signature(s) Signed: 12/24/2020 2:51:57 PM  By: [name] EMT Signed: 12/26/2020 4:42:31 PM By: Worthy Keeler PA-C Entered By: [name] on 12/24/2020 14:51:57 -------------------------------------------------------------------------------- HBO Safety Checklist Details Patient Name: Date of Service: [name], [place]. 12/24/2020 10:00 A M Medical Record Number: [id number] Patient Account Number: [id number] Date of Birth/Sex: Treating RN: 1970-06-28 (50 y.o. [name] Primary Care Toniqua Melamed: [name] Other Clinician: [name] Referring Carrin Vannostrand: Treating Laketha Leopard/Extender: [name], [name] in Treatment: 15 HBO Safety Checklist Items Safety Checklist Consent Form Signed Patient voided / foley secured and emptied When did you last eato Breakfast Last dose of injectable or oral agent 0800 Ostomy pouch emptied and vented if applicable NA All implantable devices assessed, documented and approved NA Intravenous access site secured and place NA Valuables secured Linens and cotton and cotton/polyester blend (less than 51% polyester) Personal oil-based products / skin lotions / body lotions removed Wigs or hairpieces removed NA Smoking or tobacco materials removed NA Books / newspapers / magazines / loose paper removed Cologne, aftershave, perfume and deodorant removed Jewelry removed (may wrap wedding band) Make-up removed NA Hair care products removed Battery operated devices (external) removed Heating patches and chemical warmers removed Titanium eyewear removed Nail polish cured greater than 10 hours NA Casting material cured greater than 10 hours NA Hearing aids removed NA Loose dentures or partials removed NA Prosthetics have been removed NA Patient demonstrates correct use of air break device (if applicable) Patient concerns have been addressed Patient grounding bracelet on and cord attached to chamber Specifics for Inpatients (complete in addition  to above) Medication sheet sent with patient NA Intravenous medications needed or due during therapy sent with patient NA Drainage tubes (e.g. nasogastric tube or chest tube secured and vented) NA Endotracheal or Tracheotomy tube secured NA Cuff deflated of air and inflated with  saline NA Airway suctioned NA Notes Paper version of safety checklist used prior to treatment. Data entered afterward. Electronic Signature(s) Signed: 12/24/2020 11:25:12 AM By: [name] EMT Entered By: [name] on 12/24/2020 11:09:51

## 2020-12-26 NOTE — Progress Notes (Signed)
EMARION, TORAL ([id number]) Visit Report for 12/25/2020 SuperBill Details Patient Name: Date of Service: [address], [name] 12/25/2020 Medical Record Number: [id number] Patient Account Number: [id number] Date of Birth/Sex: Treating RN: [birth year] (50 y.o. [name], [name] Primary Care Provider: [name] Other Clinician: [name] Referring Provider: Treating Provider/Extender: [name], [name] in Treatment: 15 Diagnosis Coding ICD-10 Codes Code Description E11.621 Type 2 diabetes mellitus with foot ulcer L97.514 Non-pressure chronic ulcer of other part of right foot with necrosis of bone T81.31XS Disruption of external operation (surgical) wound, not elsewhere classified, sequela L97.528 Non-pressure chronic ulcer of other part of left foot with other specified severity F17.218 Nicotine dependence, cigarettes, with other nicotine-induced disorders Facility Procedures CPT4 Code Description Modifier Quantity [id number] G0277-(Facility Use Only) HBOT full body chamber, 36min , 4 ICD-10 Diagnosis Description E11.621 Type 2 diabetes mellitus with foot ulcer L97.514 Non-pressure chronic ulcer of other part of right foot with necrosis of bone T81.31XS Disruption of external operation (surgical) wound, not elsewhere classified, sequela L97.528 Non-pressure chronic ulcer of other part of left foot with other specified severity Physician Procedures Quantity CPT4 Code Description Modifier [id number] 02725 - WC PHYS HYPERBARIC OXYGEN THERAPY 1 ICD-10 Diagnosis Description E11.621 Type 2 diabetes mellitus with foot ulcer L97.514 Non-pressure chronic ulcer of other part of right foot with necrosis of bone T81.31XS Disruption of external operation (surgical) wound, not elsewhere classified, sequela L97.528 Non-pressure chronic ulcer of other part of left foot with other specified severity Electronic Signature(s) Signed: 12/25/2020 1:20:13 PM By: [name]  EMT Signed: 12/26/2020 3:46:44 PM By: [name] MD Entered By: [name] on 12/25/2020 12:13:36

## 2020-12-26 NOTE — Progress Notes (Signed)
Joseph, [name] ([id number]) Visit Report for 12/26/2020 Arrival Information Details Patient Name: Date of Service: [place] RD, [name] 12/26/2020 8:30 A M Medical Record Number: [id number] Patient Account Number: [id number] Date of Birth/Sex: Treating RN: [birth year] (50 y.o. [name], [name] Primary Care [name]: [name] Other Clinician: Donavan [name] Referring [name]: Treating [name]/Extender: [name], [name] in Treatment: 16 Visit Information History Since Last Visit All ordered tests and consults were completed: Yes Patient Arrived: [name] Added or deleted any medications: No Arrival Time: 08:16 Any new allergies or adverse reactions: No Accompanied By: self Had a fall or experienced change in No Transfer Assistance: None activities of daily living that may affect Patient Identification Verified: Yes risk of falls: Secondary Verification Process Completed: Yes Signs or symptoms of abuse/neglect since last visito No Patient Requires Transmission-Based Precautions: No Hospitalized since last visit: No Patient Has Alerts: No Implantable device outside of the clinic excluding No cellular tissue based products placed in the center since last visit: Pain Present Now: No Electronic Signature(s) Signed: 12/26/2020 12:21:44 PM By: [name] EMT Entered By: [name] on 12/26/2020 12:01:26 -------------------------------------------------------------------------------- Encounter Discharge Information Details Patient Name: Date of Service: [name] RD, [name] 12/26/2020 8:30 A M Medical Record Number: [id number] Patient Account Number: [id number] Date of Birth/Sex: Treating RN: [birth year] (50 y.o. [name] Primary Care [name]: [name] Other Clinician: Donavan [name] Referring [name]: Treating [name]/Extender: [name], [name] in Treatment: 16 Encounter Discharge Information  Items Discharge Condition: Stable Ambulatory Status: Cane Discharge Destination: Home Transportation: Private Auto Accompanied By: self Schedule Follow-up Appointment: No Clinical Summary of Care: Electronic Signature(s) Signed: 12/26/2020 12:21:44 PM By: [name] EMT Entered By: [name] on 12/26/2020 12:01:48 -------------------------------------------------------------------------------- Vitals Details Patient Name: Date of Service: [name] RD, [name] 12/26/2020 8:30 A M Medical Record Number: [id number] Patient Account Number: [id number] Date of Birth/Sex: Treating RN: 1970-07-20 (50 y.o. [name] Primary Care [name]: [name] Other Clinician: Donavan [name] Referring [name]: Treating [name]/Extender: [name], [name] in Treatment: 16 Vital Signs Time Taken: 08:21 Temperature (F): 97.9 Height (in): 64 Pulse (bpm): 92 Weight (lbs): 188 Respiratory Rate (breaths/min): 18 Body Mass Index (BMI): 32.3 Blood Pressure (mmHg): 130/87 Capillary Blood Glucose (mg/dl): 116 Reference Range: 80 - 120 mg / dl Electronic Signature(s) Signed: 12/26/2020 12:21:44 PM By: [name] EMT Entered By: [name] on 12/26/2020 09:39:50

## 2020-12-26 NOTE — Progress Notes (Signed)
MATHEWS, STUHR ([id number]) Visit Report for 12/25/2020 HBO Details Patient Name: Date of Service: [place] RD, [name] 12/25/2020 8:30 A M Medical Record Number: [id number] Patient Account Number: [id number] Date of Birth/Sex: Treating RN: 10/28/1970 (50 y.o. [name], [name] Primary Care Menna Abeln: [name] Other Clinician: [name] Referring Chesley Valls: Treating Maycol Hoying/Extender: [name], [name] in Treatment: 15 HBO Treatment Course Details Treatment Course Number: 1 Ordering Joretta Eads: [name] Treatments Ordered: otal 40 HBO Treatment Start Date: 12/08/2020 HBO Indication: Diabetic Ulcer(s) of the Lower Extremity HBO Treatment Details Treatment Number: 3 Patient Type: Outpatient Chamber Type: Monoplace Chamber Serial #: [id number] Treatment Protocol: 2.5 ATA with 90 minutes oxygen, with two 5 minute air breaks Treatment Details Compression Rate Down: 1.0 psi / minute De-Compression Rate Up: 1.5 psi / minute A breaks and breathing ir Compress Tx Pressure periods Decompress Decompress Begins Reached (leave unused spaces Begins Ends blank) Chamber Pressure (ATA 1 2.5 2.5 2.5 2.5 2.5 - - 2.5 1 ) Clock Time (24 hr) 08:55 09:15 09:45 09:50 10:20 10:25 - - 10:55 11:10 Treatment Length: 135 (minutes) Treatment Segments: 4 Vital Signs Capillary Blood Glucose Reference Range: 80 - 120 mg / dl HBO Diabetic Blood Glucose Intervention Range: <131 mg/dl or >249 mg/dl Time Vitals Blood Respiratory Capillary Blood Glucose Pulse Action Type: Pulse: Temperature: Taken: Pressure: Rate: Glucose (mg/dl): Meter #: Oximetry (%) Taken: Pre 08:38 146/100 98 21 97.5 166 Post 11:10 144/90 85 20 97.6 141 Treatment Response Treatment Toleration: Well Treatment Completion Status: Treatment Completed without Adverse Event Additional Procedure Documentation Tissue Sevierity: Fat layer exposed Harris Kistler Notes No concerns with treatment given Physician  HBO Attestation: I certify that I supervised this HBO treatment in accordance with Medicare guidelines. A trained emergency response team is readily available per Yes hospital policies and procedures. Continue HBOT as ordered. Yes Electronic Signature(s) Signed: 12/26/2020 3:46:44 PM By: [name] MD Previous Signature: 12/25/2020 1:21:22 PM Version By: [name] EMT Entered By: [name] on 12/25/2020 17:26:42 -------------------------------------------------------------------------------- HBO Safety Checklist Details Patient Name: Date of Service: [name], [place] [name] 12/25/2020 8:30 A M Medical Record Number: [id number] Patient Account Number: [id number] Date of Birth/Sex: Treating RN: 1971/01/31 (50 y.o. [name], [name] Primary Care Joannie Medine: [name] Other Clinician: [name] Referring Craige Patel: Treating Cynethia Schindler/Extender: [name], [name] in Treatment: 15 HBO Safety Checklist Items Safety Checklist Consent Form Signed Patient voided / foley secured and emptied When did you last eato Breakfast Last dose of injectable or oral agent 0800 Ostomy pouch emptied and vented if applicable NA All implantable devices assessed, documented and approved NA Intravenous access site secured and place NA Valuables secured Linens and cotton and cotton/polyester blend (less than 51% polyester) Personal oil-based products / skin lotions / body lotions removed Wigs or hairpieces removed NA Smoking or tobacco materials removed NA Books / newspapers / magazines / loose paper removed Cologne, aftershave, perfume and deodorant removed Jewelry removed (may wrap wedding band) Make-up removed NA Hair care products removed Battery operated devices (external) removed Heating patches and chemical warmers removed Titanium eyewear removed NA Nail polish cured greater than 10 hours NA Casting material cured greater than 10 hours NA Hearing  aids removed NA Loose dentures or partials removed NA Prosthetics have been removed NA Patient demonstrates correct use of air break device (if applicable) Patient concerns have been addressed Patient grounding bracelet on and cord attached to chamber Specifics for Inpatients (complete in addition to above) Medication sheet  sent with patient NA Intravenous medications needed or due during therapy sent with patient NA Drainage tubes (e.g. nasogastric tube or chest tube secured and vented) NA Endotracheal or Tracheotomy tube secured NA Cuff deflated of air and inflated with saline NA Airway suctioned NA Notes Paper version of safety checklist used prior to treatment start. Data entered afterward. Electronic Signature(s) Signed: 12/25/2020 12:09:10 PM By: [name] EMT Entered By: [name] on 12/25/2020 10:53:48

## 2020-12-26 NOTE — Progress Notes (Signed)
EISA, NECAISE ([id number]) Visit Report for 12/26/2020 SuperBill Details Patient Name: Date of Service: [place] [address], [place] 12/26/2020 Medical Record Number: [id number] Patient Account Number: [id number] Date of Birth/Sex: Treating RN: 1970/08/28 (50 y.o. [name] Primary Care Provider: [name] Other Clinician: [name] Referring Provider: Treating Provider/Extender: [name], [name] in Treatment: 16 Diagnosis Coding ICD-10 Codes Code Description E11.621 Type 2 diabetes mellitus with foot ulcer L97.514 Non-pressure chronic ulcer of other part of right foot with necrosis of bone T81.31XS Disruption of external operation (surgical) wound, not elsewhere classified, sequela L97.528 Non-pressure chronic ulcer of other part of left foot with other specified severity F17.218 Nicotine dependence, cigarettes, with other nicotine-induced disorders Facility Procedures CPT4 Code Description Modifier Quantity 11572620 G0277-(Facility Use Only) HBOT full body chamber, 57min , 4 ICD-10 Diagnosis Description E11.621 Type 2 diabetes mellitus with foot ulcer L97.514 Non-pressure chronic ulcer of other part of right foot with necrosis of bone T81.31XS Disruption of external operation (surgical) wound, not elsewhere classified, sequela L97.528 Non-pressure chronic ulcer of other part of left foot with other specified severity Physician Procedures Quantity CPT4 Code Description Modifier [id number] 63845 - WC PHYS HYPERBARIC OXYGEN THERAPY 1 ICD-10 Diagnosis Description E11.621 Type 2 diabetes mellitus with foot ulcer L97.514 Non-pressure chronic ulcer of other part of right foot with necrosis of bone T81.31XS Disruption of external operation (surgical) wound, not elsewhere classified, sequela L97.528 Non-pressure chronic ulcer of other part of left foot with other specified severity Electronic Signature(s) Signed: 12/26/2020 12:21:44 PM By: [name] EMT Signed: 12/26/2020 3:46:44 PM By: [name] MD Entered By: [name] on 12/26/2020 12:01:09

## 2020-12-26 NOTE — Progress Notes (Signed)
[name] ([id number]) Visit Report for 12/26/2020 HBO Details Patient Name: Date of Service: [place] RD, [name] 12/26/2020 8:30 A M Medical Record Number: [id number] Patient Account Number: [id number] Date of Birth/Sex: Treating RN: 06-15-1970 (50 y.o. [name] Primary Care Dashon Mcintire: [name] Other Clinician: [name] Referring Colletta Spillers: Treating Terita Hejl/Extender: [name], [name] in Treatment: 16 HBO Treatment Course Details Treatment Course Number: 1 Ordering Laquinton Bihm: [name] Treatments Ordered: otal 40 HBO Treatment Start Date: 12/08/2020 HBO Indication: Diabetic Ulcer(s) of the Lower Extremity HBO Treatment Details Treatment Number: 4 Patient Type: Outpatient Chamber Type: Monoplace Chamber Serial #: [id number] Treatment Protocol: 2.5 ATA with 90 minutes oxygen, with two 5 minute air breaks Treatment Details Compression Rate Down: 1.5 psi / minute De-Compression Rate Up: 2.0 psi / minute A breaks and breathing ir Compress Tx Pressure periods Decompress Decompress Begins Reached (leave unused spaces Begins Ends blank) Chamber Pressure (ATA 1 2.5 2.5 2.5 2.5 2.5 - - 2.5 1 ) Clock Time (24 hr) 09:02 09:16 09:46 09:51 10:21 10:26 - - 10:56 11:06 Treatment Length: 124 (minutes) Treatment Segments: 4 Vital Signs Capillary Blood Glucose Reference Range: 80 - 120 mg / dl HBO Diabetic Blood Glucose Intervention Range: <131 mg/dl or >249 mg/dl Time Vitals Blood Respiratory Capillary Blood Glucose Pulse Action Type: Pulse: Temperature: Taken: Pressure: Rate: Glucose (mg/dl): Meter #: Oximetry (%) Taken: Pre 08:21 130/87 92 18 97.9 116 2 Post 11:10 128/85 87 18 97.5 133 2 Pre 09:02 138 2 Treatment Response Treatment Toleration: Well Treatment Completion Status: Treatment Completed without Adverse Event Additional Procedure Documentation Tissue Sevierity: Fat layer exposed [name] Notes No concerns with  treatment given Physician HBO Attestation: I certify that I supervised this HBO treatment in accordance with Medicare guidelines. A trained emergency response team is readily available per Yes hospital policies and procedures. Continue HBOT as ordered. Yes Electronic Signature(s) Signed: 12/26/2020 3:46:44 PM By: [name] MD Previous Signature: 12/26/2020 12:21:44 PM Version By: [name] EMT Entered By: [name] on 12/26/2020 15:44:12 -------------------------------------------------------------------------------- HBO Safety Checklist Details Patient Name: Date of Service: [name], [name] 12/26/2020 8:30 A M Medical Record Number: [id number] Patient Account Number: [id number] Date of Birth/Sex: Treating RN: [birth year] (50 y.o. [name] Primary Care Payslee Bateson: [name] Other Clinician: [name] Referring Symia Herdt: Treating Reinette Cuneo/Extender: [name], [name] in Treatment: 16 HBO Safety Checklist Items Safety Checklist Consent Form Signed Patient voided / foley secured and emptied When did you last eato snack this AM Last dose of injectable or oral agent n/a Ostomy pouch emptied and vented if applicable NA All implantable devices assessed, documented and approved NA Intravenous access site secured and place NA Valuables secured Linens and cotton and cotton/polyester blend (less than 51% polyester) Personal oil-based products / skin lotions / body lotions removed Wigs or hairpieces removed NA Smoking or tobacco materials removed NA Books / newspapers / magazines / loose paper removed Cologne, aftershave, perfume and deodorant removed Jewelry removed (may wrap wedding band) NA Make-up removed NA Hair care products removed NA Battery operated devices (external) removed Heating patches and chemical warmers removed Titanium eyewear removed NA Nail polish cured greater than 10 hours NA Casting material  cured greater than 10 hours NA Hearing aids removed NA Loose dentures or partials removed NA Prosthetics have been removed NA Patient demonstrates correct use of air break device (if applicable) Patient concerns have been addressed Patient grounding bracelet on and cord attached to chamber  Specifics for Inpatients (complete in addition to above) Medication sheet sent with patient NA Intravenous medications needed or due during therapy sent with patient NA Drainage tubes (e.g. nasogastric tube or chest tube secured and vented) NA Endotracheal or Tracheotomy tube secured NA Cuff deflated of air and inflated with saline NA Airway suctioned NA Notes Paper version of safety checklist used prior to treatment. Data entered afterward. Electronic Signature(s) Signed: 12/26/2020 12:21:44 PM By: [name] EMT Entered By: [name] on 12/26/2020 12:03:29

## 2020-12-29 ENCOUNTER — Encounter (HOSPITAL_BASED_OUTPATIENT_CLINIC_OR_DEPARTMENT_OTHER): Payer: 59 | Admitting: Internal Medicine

## 2020-12-30 ENCOUNTER — Encounter (HOSPITAL_BASED_OUTPATIENT_CLINIC_OR_DEPARTMENT_OTHER): Payer: 59 | Admitting: Internal Medicine

## 2020-12-30 ENCOUNTER — Other Ambulatory Visit: Payer: Self-pay

## 2020-12-30 DIAGNOSIS — M86671 Other chronic osteomyelitis, right ankle and foot: Secondary | ICD-10-CM | POA: Insufficient documentation

## 2020-12-30 DIAGNOSIS — E611 Iron deficiency: Secondary | ICD-10-CM | POA: Diagnosis not present

## 2020-12-30 DIAGNOSIS — F17218 Nicotine dependence, cigarettes, with other nicotine-induced disorders: Secondary | ICD-10-CM | POA: Insufficient documentation

## 2020-12-30 DIAGNOSIS — T8131XS Disruption of external operation (surgical) wound, not elsewhere classified, sequela: Secondary | ICD-10-CM | POA: Insufficient documentation

## 2020-12-30 DIAGNOSIS — E11621 Type 2 diabetes mellitus with foot ulcer: Secondary | ICD-10-CM | POA: Insufficient documentation

## 2020-12-30 DIAGNOSIS — X58XXXS Exposure to other specified factors, sequela: Secondary | ICD-10-CM | POA: Insufficient documentation

## 2020-12-30 DIAGNOSIS — D649 Anemia, unspecified: Secondary | ICD-10-CM | POA: Insufficient documentation

## 2020-12-30 DIAGNOSIS — D72829 Elevated white blood cell count, unspecified: Secondary | ICD-10-CM | POA: Insufficient documentation

## 2020-12-30 DIAGNOSIS — L97514 Non-pressure chronic ulcer of other part of right foot with necrosis of bone: Secondary | ICD-10-CM | POA: Insufficient documentation

## 2020-12-30 DIAGNOSIS — L97522 Non-pressure chronic ulcer of other part of left foot with fat layer exposed: Secondary | ICD-10-CM | POA: Insufficient documentation

## 2020-12-30 LAB — GLUCOSE, CAPILLARY
Glucose-Capillary: 68 mg/dL — ABNORMAL LOW (ref 70–99)
Glucose-Capillary: 129 mg/dL — ABNORMAL HIGH (ref 70–99)
Glucose-Capillary: 95 mg/dL (ref 70–99)
Glucose-Capillary: 125 mg/dL — ABNORMAL HIGH (ref 70–99)

## 2020-12-30 NOTE — Progress Notes (Signed)
[name], [name] ([id number]) Visit Report for 12/30/2020 Arrival Information Details Patient Name: Date of Service: [name] [name] RD, [place] 12/30/2020 10:00 A M Medical Record Number: [id number] Patient Account Number: [id number] Date of Birth/Sex: Treating RN: 1971/03/15 (50 y.o. [name] [name], [name] Primary Care [name] [name]: [name] [name] Other Clinician: Donavan [name] Referring [name] [name]: Treating [name] [name]/Extender: [name] [name], [name] [name] in Treatment: 16 Visit Information History Since Last Visit All ordered tests and consults were completed: Yes Patient Arrived: [name] [name] Added or deleted Joseph medications: No Arrival Time: 08:24 Joseph new allergies or adverse reactions: No Accompanied By: self Had a fall or experienced change in No Transfer Assistance: None activities of daily living that may affect Patient Identification Verified: Yes risk of falls: Secondary Verification Process Completed: Yes Signs or symptoms of abuse/neglect since last visito No Patient Requires Transmission-Based Precautions: No Hospitalized since last visit: No Patient Has Alerts: No Implantable device outside of the clinic excluding No cellular tissue based products placed in the center since last visit: Pain Present Now: No Electronic Signature(s) Signed: 12/30/2020 4:26:10 PM By: [name] [name] EMT Entered By: [name] [name] on 12/30/2020 11:49:58 -------------------------------------------------------------------------------- Encounter Discharge Information Details Patient Name: Date of Service: [name] [name] RD, [name] 12/30/2020 10:00 A M Medical Record Number: [id number] Patient Account Number: [id number] Date of Birth/Sex: Treating RN: [birth year] (50 y.o. [name] [name], [name] Primary Care Minor Iden: [name] [name] Other Clinician: Donavan [name] Referring [name] [name]: Treating [name] [name]/Extender: [name] [name], [name] [name] in Treatment: 16 Encounter Discharge Information  Items Discharge Condition: Stable Ambulatory Status: Cane Discharge Destination: Home Transportation: Private Auto Accompanied By: self Schedule Follow-up Appointment: No Clinical Summary of Care: Electronic Signature(s) Signed: 12/30/2020 4:26:10 PM By: [name] [name] EMT Entered By: [name] [name] on 12/30/2020 13:15:32 -------------------------------------------------------------------------------- [place] Details Patient Name: Date of Service: [name] [name] RD, [place] 12/30/2020 10:00 A M Medical Record Number: [id number] Patient Account Number: [id number] Date of Birth/Sex: Treating RN: [birth year] (50 y.o. [name] [name], [name] Primary Care [name] [name]: [name] [name] Other Clinician: Donavan [name] Referring [name] [name]: Treating [name] [name]/Extender: [name] [name], [name] [name] in Treatment: 16 Vital Signs Time Taken: 08:27 Temperature (F): 97.5 Height (in): 64 Pulse (bpm): 92 Weight (lbs): 188 Respiratory Rate (breaths/min): 14 Body Mass Index (BMI): 32.3 Blood Pressure (mmHg): 124/86 Capillary Blood Glucose (mg/dl): 68 Reference Range: 80 - 120 mg / dl Electronic Signature(s) Signed: 12/30/2020 4:26:10 PM By: [name] [name] EMT Entered By: [name] [name] on 12/30/2020 11:51:55

## 2020-12-31 ENCOUNTER — Encounter (HOSPITAL_BASED_OUTPATIENT_CLINIC_OR_DEPARTMENT_OTHER): Payer: 59 | Admitting: Physician Assistant

## 2020-12-31 ENCOUNTER — Encounter (HOSPITAL_BASED_OUTPATIENT_CLINIC_OR_DEPARTMENT_OTHER): Payer: 59 | Attending: Physician Assistant | Admitting: Physician Assistant

## 2020-12-31 DIAGNOSIS — L97522 Non-pressure chronic ulcer of other part of left foot with fat layer exposed: Secondary | ICD-10-CM | POA: Insufficient documentation

## 2020-12-31 DIAGNOSIS — Y835 Amputation of limb(s) as the cause of abnormal reaction of the patient, or of later complication, without mention of misadventure at the time of the procedure: Secondary | ICD-10-CM | POA: Insufficient documentation

## 2020-12-31 DIAGNOSIS — E1122 Type 2 diabetes mellitus with diabetic chronic kidney disease: Secondary | ICD-10-CM | POA: Insufficient documentation

## 2020-12-31 DIAGNOSIS — M86671 Other chronic osteomyelitis, right ankle and foot: Secondary | ICD-10-CM | POA: Diagnosis not present

## 2020-12-31 DIAGNOSIS — F17218 Nicotine dependence, cigarettes, with other nicotine-induced disorders: Secondary | ICD-10-CM | POA: Insufficient documentation

## 2020-12-31 DIAGNOSIS — Z89431 Acquired absence of right foot: Secondary | ICD-10-CM | POA: Diagnosis not present

## 2020-12-31 DIAGNOSIS — L84 Corns and callosities: Secondary | ICD-10-CM | POA: Diagnosis not present

## 2020-12-31 DIAGNOSIS — Z89412 Acquired absence of left great toe: Secondary | ICD-10-CM | POA: Diagnosis not present

## 2020-12-31 DIAGNOSIS — N183 Chronic kidney disease, stage 3 unspecified: Secondary | ICD-10-CM | POA: Insufficient documentation

## 2020-12-31 DIAGNOSIS — Z89411 Acquired absence of right great toe: Secondary | ICD-10-CM | POA: Diagnosis not present

## 2020-12-31 DIAGNOSIS — E11621 Type 2 diabetes mellitus with foot ulcer: Secondary | ICD-10-CM | POA: Insufficient documentation

## 2020-12-31 DIAGNOSIS — T8781 Dehiscence of amputation stump: Secondary | ICD-10-CM | POA: Insufficient documentation

## 2020-12-31 DIAGNOSIS — D72829 Elevated white blood cell count, unspecified: Secondary | ICD-10-CM | POA: Diagnosis not present

## 2020-12-31 DIAGNOSIS — E1169 Type 2 diabetes mellitus with other specified complication: Secondary | ICD-10-CM | POA: Diagnosis not present

## 2020-12-31 DIAGNOSIS — L97514 Non-pressure chronic ulcer of other part of right foot with necrosis of bone: Secondary | ICD-10-CM | POA: Diagnosis not present

## 2020-12-31 LAB — GLUCOSE, CAPILLARY
Glucose-Capillary: 126 mg/dL — ABNORMAL HIGH (ref 70–99)
Glucose-Capillary: 155 mg/dL — ABNORMAL HIGH (ref 70–99)
Glucose-Capillary: 197 mg/dL — ABNORMAL HIGH (ref 70–99)

## 2020-12-31 NOTE — Progress Notes (Signed)
DYLANN, GALLIER ([id number]) Visit Report for 12/30/2020 SuperBill Details Patient Name: Date of Service: [place] RD, [name] 12/30/2020 Medical Record Number: [id number] Patient Account Number: [id number] Date of Birth/Sex: Treating RN: [birth year] (50 y.o. [name], [name] Primary Care Provider: [name] Other Clinician: [name] Referring Provider: Treating Provider/Extender: [name], [name] in Treatment: 16 Diagnosis Coding ICD-10 Codes Code Description E11.621 Type 2 diabetes mellitus with foot ulcer L97.514 Non-pressure chronic ulcer of other part of right foot with necrosis of bone T81.31XS Disruption of external operation (surgical) wound, not elsewhere classified, sequela L97.528 Non-pressure chronic ulcer of other part of left foot with other specified severity F17.218 Nicotine dependence, cigarettes, with other nicotine-induced disorders Facility Procedures CPT4 Code Description Modifier Quantity [id number] G0277-(Facility Use Only) HBOT full body chamber, 37min , 3 ICD-10 Diagnosis Description E11.621 Type 2 diabetes mellitus with foot ulcer L97.514 Non-pressure chronic ulcer of other part of right foot with necrosis of bone T81.31XS Disruption of external operation (surgical) wound, not elsewhere classified, sequela L97.528 Non-pressure chronic ulcer of other part of left foot with other specified severity Physician Procedures Quantity CPT4 Code Description Modifier [id number] 81448 - WC PHYS HYPERBARIC OXYGEN THERAPY 1 ICD-10 Diagnosis Description E11.621 Type 2 diabetes mellitus with foot ulcer L97.514 Non-pressure chronic ulcer of other part of right foot with necrosis of bone T81.31XS Disruption of external operation (surgical) wound, not elsewhere classified, sequela L97.528 Non-pressure chronic ulcer of other part of left foot with other specified severity Electronic Signature(s) Signed: 12/30/2020 4:26:10 PM By: [name] EMT Signed: 12/31/2020 10:14:24 AM By: [name] MD Entered By: [name] on 12/30/2020 12:39:04

## 2020-12-31 NOTE — Progress Notes (Signed)
WARNELL, RASNIC ([id number]) Visit Report for 12/30/2020 HBO Details Patient Name: Date of Service: [place] [address], [place] 12/30/2020 10:00 A M Medical Record Number: [id number] Patient Account Number: [id number] Date of Birth/Sex: Treating RN: [birth year] (49 y.o. [name], [place] Primary Care Sarp Vernier: [name] Other Clinician: [name] Referring Destan Franchini: Treating Kolbi Tofte/Extender: [name], [name] in Treatment: 16 HBO Treatment Course Details Treatment Course Number: 1 Ordering Zac Torti: [name] Treatments Ordered: otal 40 HBO Treatment Start Date: 12/08/2020 HBO Indication: Diabetic Ulcer(s) of the Lower Extremity HBO Treatment Details Treatment Number: 5 Patient Type: Outpatient Chamber Type: Monoplace Chamber Serial #: [id number] Treatment Protocol: 2.5 ATA with 90 minutes oxygen, with two 5 minute air breaks Treatment Details Compression Rate Down: 2.0 psi / minute De-Compression Rate Up: 1.0 psi / minute A breaks and breathing ir Compress Tx Pressure periods Decompress Decompress Begins Reached (leave unused spaces Begins Ends blank) Chamber Pressure (ATA 1 2.5 2.5 2.5 2.5 2.5 - - 2.5 1 ) Clock Time (24 hr) 09:44 10:55 10:25 10:30 10:54 10:59 - - 11:00 11:24 Treatment Length: 100 (minutes) Treatment Segments: 3 Vital Signs Capillary Blood Glucose Reference Range: 80 - 120 mg / dl HBO Diabetic Blood Glucose Intervention Range: <131 mg/dl or >249 mg/dl Type: Time Vitals Blood Pulse: Respiratory Temperature: Capillary Blood Glucose Pulse Action Taken: Pressure: Rate: Glucose (mg/dl): Meter #: Oximetry (%) Taken: Pre 08:27 124/86 92 14 97.5 68 2 8 oz Glucerna Shake given Post 11:29 137/86 89 16 97.5 125 2 discharge per protocol >101 mg/dL Pre 08:58 95 2 patient refused additional nutrition supplement Pre 09:27 129 2 Cleared for treatment Treatment Response Treatment Toleration: Well Adverse Events: 1:Barotrauma -  Sinus Treatment Completion Status: Treatment Aborted/Not Restarted Reason: Adverse Event Treatment Notes Approximately 6606, while at treatment depth at a continuous pressure of 2.5 ATA, patient complained that it was "hard to breathe," illustrating by breathing through his nose. As a precaution I had the patient take an early air break. 1059 after patient finished air break patient described pain in his forehead and nasal areas. He reported using Afrin this morning and requested to be removed from the chamber. Per protocol, physician was alerted and decompression was started at 69. Patient traveled at a rate of 1.0 psi/min until reaching 1 ATA at 1124. Vital signs were within normal limits. Patient denied using Afrin this morning once outside the chamber and refused needing an antihistamine to remedy his sinus pain. Patient was discharged. Additional Procedure Documentation Tissue Sevierity: Fat layer exposed Genie Mirabal Notes The patient stayed at treatment depth for 2 segments however then complained of sinus issues and asked to be brought to the surface. I will look at him tomorrow to discuss Physician HBO Attestation: I certify that I supervised this HBO treatment in accordance with Medicare guidelines. A trained emergency response team is readily available per Yes hospital policies and procedures. Continue HBOT as ordered. Yes Electronic Signature(s) Signed: 12/31/2020 10:14:24 AM By: [name] MD Previous Signature: 12/30/2020 4:26:10 PM Version By: [name] EMT Entered By: [name] on 12/31/2020 10:11:19 -------------------------------------------------------------------------------- HBO Safety Checklist Details Patient Name: Date of Service: [name], [place] 12/30/2020 10:00 A M Medical Record Number: [id number] Patient Account Number: [id number] Date of Birth/Sex: Treating RN: 1970-04-28 (50 y.o. [name], [name] Primary Care Norah Fick: [name] Other Clinician: [name] Referring Dajuan Turnley: Treating Lakeishia Truluck/Extender: [name], [name] in Treatment: 16 HBO Safety Checklist Items Safety Checklist Consent Form Signed Patient  voided / foley secured and emptied NA When did you last eato Yesterday evening Last dose of injectable or oral agent not today Ostomy pouch emptied and vented if applicable NA All implantable devices assessed, documented and approved NA Intravenous access site secured and place NA Valuables secured NA Linens and cotton and cotton/polyester blend (less than 51% polyester) Personal oil-based products / skin lotions / body lotions removed Wigs or hairpieces removed NA Smoking or tobacco materials removed NA Books / newspapers / magazines / loose paper removed Cologne, aftershave, perfume and deodorant removed Jewelry removed (may wrap wedding band) NA Make-up removed NA Hair care products removed Battery operated devices (external) removed Heating patches and chemical warmers removed Titanium eyewear removed NA Nail polish cured greater than 10 hours NA Casting material cured greater than 10 hours NA Hearing aids removed NA Loose dentures or partials removed NA Prosthetics have been removed NA Patient demonstrates correct use of air break device (if applicable) Patient concerns have been addressed Patient grounding bracelet on and cord attached to chamber Specifics for Inpatients (complete in addition to above) Medication sheet sent with patient NA Intravenous medications needed or due during therapy sent with patient NA Drainage tubes (e.g. nasogastric tube or chest tube secured and vented) NA Endotracheal or Tracheotomy tube secured NA Cuff deflated of air and inflated with saline NA Airway suctioned NA Notes Paper version used prior to treatment. Data entered afterward. Electronic Signature(s) Signed: 12/30/2020 6:24:56 PM By: [name] EMT Previous Signature: 12/30/2020 4:26:10 PM Version By: [name] EMT Entered By: [name] on 12/30/2020 17:02:52

## 2020-12-31 NOTE — Progress Notes (Addendum)
[name], [name] ([id number]) Visit Report for 12/31/2020 Arrival Information Details Patient Name: Date of Service: [employer] [name], [name] 12/31/2020 8:00 A M Medical Record Number: [id number] Patient Account Number: [id number] Date of Birth/Sex: Treating RN: 1970-10-02 (50 y.o. [name] Primary Care [name]: [name] Other Clinician: [name] Referring Aura Bibby: Treating Janai Brannigan/Extender: [name], [name] in Treatment: 16 Visit Information History Since Last Visit All ordered tests and consults were completed: Yes Patient Arrived: Cane Added or deleted any medications: No Arrival Time: 08:11 Any new allergies or adverse reactions: No Accompanied By: self Had a fall or experienced change in No Transfer Assistance: None activities of daily living that may affect Patient Identification Verified: Yes risk of falls: Secondary Verification Process Completed: Yes Signs or symptoms of abuse/neglect since last visito No Patient Requires Transmission-Based Precautions: No Hospitalized since last visit: No Patient Has Alerts: No Implantable device outside of the clinic excluding No cellular tissue based products placed in the center since last visit: Has Dressing in Place as Prescribed: Yes Has Compression in Place as Prescribed: Yes Pain Present Now: No Electronic Signature(s) Signed: 12/31/2020 12:11:49 PM By: [name] EMT Previous Signature: 12/31/2020 9:20:19 AM Version By: [name] EMT Entered By: [name] on 12/31/2020 11:56:39 -------------------------------------------------------------------------------- Encounter Discharge Information Details Patient Name: Date of Service: [name], [name]. 12/31/2020 8:00 A M Medical Record Number: [id number] Patient Account Number: [id number] Date of Birth/Sex: Treating RN: [birth year] (50 y.o. [name] Primary Care [name]: [name] Other Clinician:  [name] Referring Dawt Reeb: Treating Khylan Sawyer/Extender: [name], [name] in Treatment: 16 Encounter Discharge Information Items Discharge Condition: Stable Ambulatory Status: Cane Discharge Destination: Other (Note Required) Transportation: Other Accompanied By: self Schedule Follow-up Appointment: No Clinical Summary of Care: Notes Wound Care Encounter after HBOT Electronic Signature(s) Signed: 12/31/2020 12:11:49 PM By: [name] EMT Entered By: [name] on 12/31/2020 11:18:53 -------------------------------------------------------------------------------- [place] Details Patient Name: Date of Service: [name], [place]. 12/31/2020 8:00 A M Medical Record Number: [id number] Patient Account Number: [id number] Date of Birth/Sex: Treating RN: 12-02-1970 (50 y.o. [name] Primary Care [name]: [name] Other Clinician: [name] Referring [name]: Treating [name]/Extender: [name], [name] in Treatment: 16 Vital Signs Time Taken: 08:19 Capillary Blood Glucose (mg/dl): 126 Height (in): 64 Reference Range: 80 - 120 mg / dl Weight (lbs): 188 Body Mass Index (BMI): 32.3 Electronic Signature(s) Signed: 12/31/2020 9:20:19 AM By: [name] EMT Entered By: [name] on 12/31/2020 09:13:09

## 2020-12-31 NOTE — Progress Notes (Addendum)
TATUM, MASSMAN ([id number]) Visit Report for 12/31/2020 Chief Complaint Document Details Patient Name: Date of Service: [address], [name] 12/31/2020 10:15 A M Medical Record Number: [id number] Patient Account Number: [id number] Date of Birth/Sex: Treating RN: 1971/01/30 (50 y.o. [name] Primary Care Provider: [name] Other Clinician: Referring Provider: Treating Provider/Extender: [name], [name] in Treatment: 16 Information Obtained from: Patient Chief Complaint 09/05/2020; patient is here for review of the wound extensively on a right TMA amputation site. He also has a wound on the plantar aspect of the left foot Electronic Signature(s) Signed: 12/31/2020 10:43:22 AM By: Worthy Keeler PA-C Entered By: Worthy Keeler on 12/31/2020 10:43:22 -------------------------------------------------------------------------------- Debridement Details Patient Name: Date of Service: [name], [name] 12/31/2020 10:15 A M Medical Record Number: [id number] Patient Account Number: [id number] Date of Birth/Sex: Treating RN: 1970/06/09 (50 y.o. [name] Primary Care Provider: [name] Other Clinician: Referring Provider: Treating Provider/Extender: [name], [name] in Treatment: 16 Debridement Performed for Assessment: Wound #3 Right,Proximal,Lateral Foot Performed By: Physician Worthy Keeler, PA Debridement Type: Debridement Severity of Tissue Pre Debridement: Fat layer exposed Level of Consciousness (Pre-procedure): Awake and Alert Pre-procedure Verification/Time Out Yes - 11:55 Taken: Start Time: 11:58 T Area Debrided (L x W): otal 1 (cm) x 1 (cm) = 1 (cm) Tissue and other material debrided: Viable, Non-Viable, Callus, Slough, Subcutaneous, Skin: Epidermis, Slough Level: Skin/Subcutaneous Tissue Debridement Description: Excisional Instrument: Curette Bleeding: Minimum Hemostasis Achieved: Pressure Procedural  Pain: 0 Post Procedural Pain: 0 Response to Treatment: Procedure was tolerated well Level of Consciousness (Post- Awake and Alert procedure): Post Debridement Measurements of Total Wound Length: (cm) 0.7 Width: (cm) 0.3 Depth: (cm) 1.4 Volume: (cm) 0.231 Character of Wound/Ulcer Post Debridement: Improved Severity of Tissue Post Debridement: Fat layer exposed Post Procedure Diagnosis Same as Pre-procedure Electronic Signature(s) Signed: 01/01/2021 6:17:04 PM By: Baruch Gouty RN, BSN Signed: 01/02/2021 9:30:20 AM By: Worthy Keeler PA-C Entered By: Baruch Gouty on 12/31/2020 12:06:12 -------------------------------------------------------------------------------- Debridement Details Patient Name: Date of Service: [name], [place]. 12/31/2020 10:15 A M Medical Record Number: [id number] Patient Account Number: [id number] Date of Birth/Sex: Treating RN: 12-Jun-1970 (50 y.o. [name] Primary Care Provider: [name] Other Clinician: Referring Provider: Treating Provider/Extender: [name], [name] in Treatment: 16 Debridement Performed for Assessment: Wound #7 Right,Lateral,Plantar Foot Performed By: Physician Worthy Keeler, PA Debridement Type: Debridement Severity of Tissue Pre Debridement: Necrosis of bone Level of Consciousness (Pre-procedure): Awake and Alert Pre-procedure Verification/Time Out Yes - 11:55 Taken: Start Time: 11:58 T Area Debrided (L x W): otal 3 (cm) x 2 (cm) = 6 (cm) Tissue and other material debrided: Viable, Non-Viable, Bone, Callus, Slough, Subcutaneous, Skin: Epidermis, Slough Level: Skin/Subcutaneous Tissue/Muscle/Bone Debridement Description: Excisional Instrument: Curette Specimen: Tissue Culture Number of Specimens T aken: 1 Bleeding: Minimum Hemostasis Achieved: Pressure Procedural Pain: 0 Post Procedural Pain: 0 Response to Treatment: Procedure was tolerated well Level of Consciousness (Post-  Awake and Alert procedure): Post Debridement Measurements of Total Wound Length: (cm) 0.5 Width: (cm) 0.5 Depth: (cm) 1 Volume: (cm) 0.196 Character of Wound/Ulcer Post Debridement: Improved Severity of Tissue Post Debridement: Necrosis of bone Post Procedure Diagnosis Same as Pre-procedure Electronic Signature(s) Signed: 01/01/2021 6:17:04 PM By: Baruch Gouty RN, BSN Signed: 01/02/2021 9:30:20 AM By: Worthy Keeler PA-C Entered By: Baruch Gouty on 12/31/2020 12:13:48 -------------------------------------------------------------------------------- Debridement Details Patient Name: Date of Service: [name], [name] 12/31/2020 10:15  A M Medical Record Number: [id number] Patient Account Number: [id number] Date of Birth/Sex: Treating RN: 08-Feb-1971 (51 y.o. [name] Primary Care Provider: [name] Other Clinician: Referring Provider: Treating Provider/Extender: [name], [name] in Treatment: 16 Debridement Performed for Assessment: Wound #6 Left,Plantar Foot Performed By: Physician Worthy Keeler, PA Debridement Type: Debridement Severity of Tissue Pre Debridement: Fat layer exposed Level of Consciousness (Pre-procedure): Awake and Alert Pre-procedure Verification/Time Out Yes - 11:55 Taken: Start Time: 11:58 T Area Debrided (L x W): otal 1.3 (cm) x 0.7 (cm) = 0.91 (cm) Tissue and other material debrided: Viable, Non-Viable, Callus, Slough, Subcutaneous, Skin: Epidermis, Slough Level: Skin/Subcutaneous Tissue Debridement Description: Excisional Instrument: Curette Bleeding: Minimum Hemostasis Achieved: Pressure Procedural Pain: 0 Post Procedural Pain: 0 Response to Treatment: Procedure was tolerated well Level of Consciousness (Post- Awake and Alert procedure): Post Debridement Measurements of Total Wound Length: (cm) 0.9 Width: (cm) 0.4 Depth: (cm) 0.2 Volume: (cm) 0.057 Character of Wound/Ulcer Post Debridement:  Improved Severity of Tissue Post Debridement: Fat layer exposed Post Procedure Diagnosis Same as Pre-procedure Electronic Signature(s) Signed: 01/01/2021 6:17:04 PM By: Baruch Gouty RN, BSN Signed: 01/02/2021 9:30:20 AM By: Worthy Keeler PA-C Entered By: Baruch Gouty on 12/31/2020 12:14:51 -------------------------------------------------------------------------------- HPI Details Patient Name: Date of Service: [name], [place]. 12/31/2020 10:15 A M Medical Record Number: [id number] Patient Account Number: [id number] Date of Birth/Sex: Treating RN: [birth year] (50 y.o. [name] Primary Care Provider: [name] Other Clinician: Referring Provider: Treating Provider/Extender: [name], [name] in Treatment: 16 History of Present Illness HPI Description: ADMISSION 09/05/2020 This is a 50 year old man who has type 2 diabetes. Essentially the problem began admitted to [facility] health in December with infection gas gangrene. Cultures ultimately grew staph lugdunensis. 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 March Rummage. Shortly thereafter he developed a nonhealing wound with wound dehiscence noted on 05/09/2020. He had a wound VAC I think for a period of time after the surgery but it did not help. He has undergone an IandD and skin graft on the right foot on 05/16/2020. He underwent a further IandD on 06/04/2020 not debrided to bone. 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 Culture of the metatarsal grew Enterobacter cloacae I. He has been followed by Dr. [name] of infectious disease. Currently on Levaquin and doxycycline I think at about the 4-week of 6 mark. At the last visit with Dr. March Rummage he was supposed to have use Santyl although I do not think he has it and he has been using I think a Betadine wet-to-dry. He is referred here by Dr. [name] for our  review who saw him yesterday. I cannot see that he has had imaging studies of the right foot. No MRI Past medical history includes type 2 diabetes, in 2021 and ulcer of the left foot that ultimately healed with a wound VAC, stage III chronic kidney disease, first toe amputations bilaterally, hypertension, still a 1/2 pack/day smoker. The patient had ABIs on 08/09/2018 which was 1.00 on the right and 0.96 on the left both areas had triphasic waveforms. Not felt to have an arterial issue. 09/12/2020; 1 week follow-up. He has been doing the dressings along with home health. This is a deep punched out area in the incision of his transmit site. We use silver alginate last week to help with drainage and antibacterial properties. He tolerated this  well 6/24; the patient was actually at his podiatrist this morning who debrided the wound. We have been using silver alginate. They are going to follow him in 6 weeks. 6/29; patient is using silver alginate. Apparently saw Dr. March Rummage this week although I have not checked his note I will try to do so. He is following up in 6 weeks. This was the original transmetatarsal amputation when I first saw this 3 mid part of his amputation site was deep down to bone however this seems to have progressively close down which is gratifying to see. He still has a fairly callus uneven surface however spreading this apart its difficult to identify anything that does not look epithelialized. When I first saw this I thought he would require an extensive debridement perhaps back in the OR by Dr. March Rummage however all of this appears to be looking better and at this point I would continue with the silver alginate see if this holds together over the next several weeks. He does not have an arterial issue 7/13; 2-week follow-up. Unfortunately the area did not hold together there was drainage on his dressing. Still copious amounts of callus. It was difficult to see where this actually was open  therefore I elected to go ahead with a vigorous debridement. Still using silver alginate 7/20 I brought him back after extensive callus debridement last week. The only open area was on the medial part of the foot extensive debridement of thick surrounding callus and I was able to do identify the remaining tunneling area. 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 This is the area that we may have been following. This is smaller this week but still surrounded with thick callus He came in today with a large area of callus on the left midfoot. This I had noticed previously. HOWEVER our intake nurse clearly incorrectly documented this as opening. The callus and split there was an odor so all of this had to be removed. 10/29/2020 unfortunately upon evaluation today this patient appears to have significant mount of callus noted at this point. There does not appear to be any signs of active infection systemically which is great although locally he is having some discomfort around the lateral portion of his foot on the right. The left foot is no having a lot of callus I am not certain even has an open wound if there is anything is extremely tiny. 11/05/2020 upon evaluation today patient's wounds actually appear to be doing a bit better compared to last week since we have uncovered some of these areas I think there are some definite improvements. With that being said there is still quite a bit of tunneling and undermining at many locations that again has me concerned I still think that the MRI is the best thing to do he has that scheduled for Saturday. 11/26/2020 upon evaluation today patient appears to be doing decently well in regard to his foot in general. He did have an MRI I did review this and he had multiple findings on MRI consistent with osteomyelitis across the board in regard pretty much to his remaining metatarsal regions. Nonetheless there  was also some evidence of some involvement of the navicular bone as well. Either way I think that this patient actually is in desperate need of get him in for hyperbaric oxygen therapy we discussed that today that will be detailed in the plan. Nonetheless he is also still  on the doxycycline which I think is appropriate for the time being as well I did go ahead and place him on that following the results of the MRI based on what we were seeing. I gave him that prescription for 14 days for the time being and we will go from there. 12/03/2020 upon evaluation today patient appears to be doing decently well in regard to his wounds in general in regard to the right foot. With that being said he does have a callus on the left foot in the end there was actually an area open area here that will need to be addressed. This is small and I think we take care of it now hopefully will heal quite readily. Fortunately I do not see any signs of infection worsening I am getting need to refill the antibiotic for him today. He has been taking doxycycline which seems to be doing well. We will also get a go ahead and get him approved for HBO therapy. 12/31/2020 upon evaluation today patient appears to be doing really roughly the same as compared to last time I saw him. Fortunately there is no evidence of active infection which is great news systemically he has started hyperbarics he seems to be doing decently well he said a couple times where anxiety kind of got to him but overall I think he is doing awesome. He is no longer on the antibiotic therapy. With that being said I do believe that he is going to need likely some additional antibiotics based on what I am seeing today we can actually obtain a bone culture from the plantar wound that was hiding underneath the callus. He was having pain in this area which is what may be try to clear away the callus to look sure enough there was an actual opening that went deeper into the  wound bed region here. Electronic Signature(s) Signed: 12/31/2020 1:39:28 PM By: Worthy Keeler PA-C Entered By: Worthy Keeler on 12/31/2020 13:39:28 -------------------------------------------------------------------------------- Physical Exam Details Patient Name: Date of Service: [name], [name] 12/31/2020 10:15 A M Medical Record Number: [id number] Patient Account Number: [id number] Date of Birth/Sex: Treating RN: 1970-11-02 (50 y.o. [name] Primary Care Provider: [name] Other Clinician: Referring Provider: Treating Provider/Extender: [name], [name] in Treatment: 16 Constitutional Well-nourished and well-hydrated in no acute distress. Respiratory normal breathing without difficulty. Psychiatric this patient is able to make decisions and demonstrates good insight into disease process. Alert and Oriented x 3. pleasant and cooperative. Notes Patient's wound bed on the right foot actually showed a lateral wound that was still open as well as the plantar wound which previously I thought had closed up but nonetheless still was open and hiding applied. This is where he was somewhat painful. In regard to the left foot before debridement I did change gloves and instruments so that would not pass anything along I did perform some of the debridement to clear away callus around the edges of the wound he tolerated that today without complication and postdebridement the wound bed appears to be doing much better this is very close to complete closure. Electronic Signature(s) Signed: 12/31/2020 1:40:16 PM By: Worthy Keeler PA-C Entered By: Worthy Keeler on 12/31/2020 13:40:15 -------------------------------------------------------------------------------- Physician Orders Details Patient Name: Date of Service: [name], [name] 12/31/2020 10:15 A M Medical Record Number: [id number] Patient Account Number: [id number] Date of Birth/Sex: Treating  RN: 1970/10/08 (50 y.o. [name] Primary Care Provider:  Minette Brine, Amy Other Clinician: Referring Provider: Treating Provider/Extender: [name], [name] in Treatment: [phone] Verbal / Phone Orders: No Diagnosis Coding ICD-10 Coding Code Description [id number] Other chronic osteomyelitis, right ankle and foot E11.621 Type 2 diabetes mellitus with foot ulcer L97.514 Non-pressure chronic ulcer of other part of right foot with necrosis of bone T81.31XS Disruption of external operation (surgical) wound, not elsewhere classified, sequela F17.218 Nicotine dependence, cigarettes, with other nicotine-induced disorders L97.522 Non-pressure chronic ulcer of other part of left foot with fat layer exposed Follow-up Appointments ppointment in 2 weeks. - with Margarita Grizzle Return A Nurse Visit: - Fri 10/7 and Wed10/12 after HBO Bathing/ Shower/ Hygiene May shower and wash wound with soap and water. - on days you change dressing Edema Control - Lymphedema / SCD / Other Elevate legs to the level of the heart or above for 30 minutes daily and/or when sitting, a frequency of: Avoid standing for long periods of time. Moisturize legs daily. - to foot with dressing changes Off-Loading Open toe surgical shoe to: - Wear at all times except driving. on right foot Other: - may wear shoe with memory foam insert to left foot Additional Orders / Instructions Stop/Decrease Smoking Follow Nutritious Diet Non Wound Condition Other Non Wound Condition Orders/Instructions: - cushion bottom of left foot with orthopedic felt or foam [place] to [place] for wound care. May utilize formulary equivalent dressing for wound treatment orders unless otherwise specified. Dressing changes to be completed by [place] on Monday / Wednesday / Friday except when patient has scheduled visit at [facility]. Hyperbaric Oxygen Therapy Indication: - wagner grade 3 diabetic foot ulcer of right  foot If appropriate for treatment, begin HBOT per protocol: 2.5 ATA for 90 Minutes with 2 Five (5) Minute A Breaks ir Total Number of Treatments: - 40 One treatments per day (delivered Monday through Friday unless otherwise specified in Special Instructions below): Finger stick Blood Glucose Pre- and Post- HBOT Treatment. Follow Hyperbaric Oxygen Glycemia Protocol A frin (Oxymetazoline HCL) 0.05% nasal spray - 1 spray in both nostrils daily as needed prior to HBO treatment for difficulty clearing ears Wound Treatment Wound #3 - Foot Wound Laterality: Right, Lateral, Proximal Cleanser: Soap and Water Every Other Day/30 Days Discharge Instructions: May shower and wash wound with dial antibacterial soap and water prior to dressing change. Cleanser: Wound Cleanser Every Other Day/30 Days Discharge Instructions: Cleanse the wound with wound cleanser prior to applying a clean dressing using gauze sponges, not tissue or cotton balls. Peri-Wound Care: Sween Lotion (Moisturizing lotion) Every Other Day/30 Days Discharge Instructions: Apply moisturizing lotion to foot with dressing changes Prim Dressing: Hydrofera Blue Classic Foam, 2x2 in Every Other Day/30 Days ary Discharge Instructions: rope Moisten with saline prior to applying to wound bed Secondary Dressing: Woven Gauze Sponge, Non-Sterile 4x4 in (Generic) Every Other Day/30 Days Discharge Instructions: Apply over primary dressing as directed. Secondary Dressing: ABD Pad, 5x9 Every Other Day/30 Days Discharge Instructions: Apply over primary dressing as directed. Secured With: Coban Self-Adherent Wrap 4x5 (in/yd) (Generic) Every Other Day/30 Days Discharge Instructions: Secure with Coban as directed. Secured With: [employer], 4.5x3.1 (in/yd) (Generic) Every Other Day/30 Days Discharge Instructions: Secure with Kerlix as directed. Secured With: 46M Medipore H Soft Cloth Surgical Tape, 2x2 (in/yd) (Generic) Every Other Day/30  Days Discharge Instructions: Secure dressing with tape as directed. Wound #6 - Foot Wound Laterality: Plantar, Left Cleanser: Soap and Water Every Other Day/30 Days Discharge Instructions: May shower  and wash wound with dial antibacterial soap and water prior to dressing change. Cleanser: Wound Cleanser Every Other Day/30 Days Discharge Instructions: Cleanse the wound with wound cleanser prior to applying a clean dressing using gauze sponges, not tissue or cotton balls. Peri-Wound Care: Sween Lotion (Moisturizing lotion) Every Other Day/30 Days Discharge Instructions: Apply moisturizing lotion to foot with dressing changes Prim Dressing: Hydrofera Blue Classic Foam, 2x2 in Every Other Day/30 Days ary Discharge Instructions: Moisten with saline prior to applying to wound bed Secondary Dressing: Woven Gauze Sponge, Non-Sterile 4x4 in (Generic) Every Other Day/30 Days Discharge Instructions: Apply over primary dressing as directed. Secondary Dressing: Optifoam Non-Adhesive Dressing, 4x4 in (Generic) Every Other Day/30 Days Discharge Instructions: Apply over primary dressing cut to make foam donut Secured With: Coban Self-Adherent Wrap 4x5 (in/yd) (Generic) Every Other Day/30 Days Discharge Instructions: Secure with Coban as directed. Secured With: [employer], 4.5x3.1 (in/yd) (Generic) Every Other Day/30 Days Discharge Instructions: Secure with Kerlix as directed. Secured With: 2M Medipore H Soft Cloth Surgical Tape, 2x2 (in/yd) (Generic) Every Other Day/30 Days Discharge Instructions: Secure dressing with tape as directed. Wound #7 - Foot Wound Laterality: Plantar, Right, Lateral Cleanser: Soap and Water Every Other Day/30 Days Discharge Instructions: May shower and wash wound with dial antibacterial soap and water prior to dressing change. Cleanser: Wound Cleanser Every Other Day/30 Days Discharge Instructions: Cleanse the wound with wound cleanser prior to applying a clean dressing  using gauze sponges, not tissue or cotton balls. Peri-Wound Care: Sween Lotion (Moisturizing lotion) Every Other Day/30 Days Discharge Instructions: Apply moisturizing lotion to foot with dressing changes Prim Dressing: Hydrofera Blue Classic Foam, 2x2 in Every Other Day/30 Days ary Discharge Instructions: rope Moisten with saline prior to applying to wound bed Secondary Dressing: Woven Gauze Sponge, Non-Sterile 4x4 in (Generic) Every Other Day/30 Days Discharge Instructions: Apply over primary dressing as directed. Secondary Dressing: ABD Pad, 5x9 Every Other Day/30 Days Discharge Instructions: Apply over primary dressing as directed. Secured With: Coban Self-Adherent Wrap 4x5 (in/yd) (Generic) Every Other Day/30 Days Discharge Instructions: Secure with Coban as directed. Secured With: [employer], 4.5x3.1 (in/yd) (Generic) Every Other Day/30 Days Discharge Instructions: Secure with Kerlix as directed. Secured With: 2M Medipore H Soft Cloth Surgical Tape, 2x2 (in/yd) (Generic) Every Other Day/30 Days Discharge Instructions: Secure dressing with tape as directed. Laboratory naerobe culture (MICRO) - right lateral plantar foot Bacteria identified in Unspecified specimen by A LOINC Code: 814-4 Convenience Name: Anerobic culture GLYCEMIA INTERVENTIONS PROTOCOL PRE-HBO GLYCEMIA INTERVENTIONS ACTION INTERVENTION Obtain pre-HBO capillary blood glucose (ensure 1 physician order is in chart). A. Notify HBO physician and await physician orders. 2 If result is 70 mg/dl or below: B. If the result meets the hospital definition of a critical result, follow hospital policy. A. Give patient an 8 ounce Glucerna Shake, an 8 ounce Ensure, or 8 ounces of a Glucerna/Ensure equivalent dietary supplement*. B. Wait 30 minutes. If result is 71 mg/dl to 130 mg/dl: C. Retest patients capillary blood glucose (CBG). D. If result greater than or equal to 110 mg/dl, proceed with HBO. If result  less than 110 mg/dl, notify HBO physician and consider holding HBO. If result is 131 mg/dl to 249 mg/dl: A. Proceed with HBO. A. Notify HBO physician and await physician orders. B. It is recommended to hold HBO and do If result is 250 mg/dl or greater: blood/urine ketone testing. C. If the result meets the hospital definition of a critical result, follow hospital policy. POST-HBO GLYCEMIA INTERVENTIONS ACTION  INTERVENTION Obtain post HBO capillary blood glucose (ensure 1 physician order is in chart). A. Notify HBO physician and await physician orders. 2 If result is 70 mg/dl or below: B. If the result meets the hospital definition of a critical result, follow hospital policy. A. Give patient an 8 ounce Glucerna Shake, an 8 ounce Ensure, or 8 ounces of a Glucerna/Ensure equivalent dietary supplement*. B. Wait 15 minutes for symptoms of If result is 71 mg/dl to 100 mg/dl: hypoglycemia (i.e. nervousness, anxiety, sweating, chills, clamminess, irritability, confusion, tachycardia or dizziness). C. If patient asymptomatic, discharge patient. If patient symptomatic, repeat capillary blood glucose (CBG) and notify HBO physician. If result is 101 mg/dl to 249 mg/dl: A. Discharge patient. A. Notify HBO physician and await physician orders. B. It is recommended to do blood/urine ketone If result is 250 mg/dl or greater: testing. C. If the result meets the hospital definition of a critical result, follow hospital policy. *Juice or candies are NOT equivalent products. If patient refuses the Glucerna or Ensure, please consult the hospital dietitian for an appropriate substitute. Electronic Signature(s) Signed: 01/01/2021 6:17:04 PM By: Baruch Gouty RN, BSN Signed: 01/02/2021 9:30:20 AM By: Worthy Keeler PA-C Entered By: Baruch Gouty on 12/31/2020 12:39:39 -------------------------------------------------------------------------------- Problem List Details Patient  Name: Date of Service: [name], [name] 12/31/2020 10:15 A M Medical Record Number: [id number] Patient Account Number: [id number] Date of Birth/Sex: Treating RN: 1970/06/25 (50 y.o. [name] Primary Care Provider: Minette Brine, [place] Other Clinician: Referring Provider: Treating Provider/Extender: [name], [name] in Treatment: 16 Active Problems ICD-10 Encounter Code Description Active Date MDM Diagnosis [phone] Other chronic osteomyelitis, right ankle and foot 09/05/2020 No Yes E11.621 Type 2 diabetes mellitus with foot ulcer 09/05/2020 No Yes L97.514 Non-pressure chronic ulcer of other part of right foot with necrosis of bone 09/05/2020 No Yes T81.31XS Disruption of external operation (surgical) wound, not elsewhere classified, 09/05/2020 No Yes sequela F17.218 Nicotine dependence, cigarettes, with other nicotine-induced disorders 11/26/2020 No Yes L97.522 Non-pressure chronic ulcer of other part of left foot with fat layer exposed 10/22/2020 No Yes Inactive Problems Resolved Problems Electronic Signature(s) Signed: 12/31/2020 10:43:08 AM By: Worthy Keeler PA-C Entered By: Worthy Keeler on 12/31/2020 10:43:08 -------------------------------------------------------------------------------- Progress Note Details Patient Name: Date of Service: [name], [place] [name] 12/31/2020 10:15 A M Medical Record Number: [id number] Patient Account Number: [id number] Date of Birth/Sex: Treating RN: 06-Sep-1970 (50 y.o. [name] Primary Care Provider: [name] Other Clinician: Referring Provider: Treating Provider/Extender: [name], [name] in Treatment: 16 Subjective Chief Complaint Information obtained from Patient 09/05/2020; patient is here for review of the wound extensively on a right TMA amputation site. He also has a wound on the plantar aspect of the left foot History of Present Illness (HPI) ADMISSION 09/05/2020 This is a  50 year old man who has type 2 diabetes. Essentially the problem began admitted to [facility] health in December with infection gas gangrene. Cultures ultimately grew staph lugdunensis. 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 March Rummage. Shortly thereafter he developed a nonhealing wound with wound dehiscence noted on 05/09/2020. He had a wound VAC I think for a period of time after the surgery but it did not help. He has undergone an IandD and skin graft on the right foot on 05/16/2020. He underwent a further IandD on 06/04/2020 not debrided to bone. Noted to have exposed bone with osteomyelitis on 07/29/2020 he underwent an operative debridement with resection  of the metatarsal. Culture of the metatarsal grew Enterobacter cloacae I. He has been followed by Dr. [name] of infectious disease. Currently on Levaquin and doxycycline I think at about the 4-week of 6 mark. At the last visit with Dr. March Rummage he was supposed to have use Santyl although I do not think he has it and he has been using I think a Betadine wet-to-dry. He is referred here by Dr. [name] for our review who saw him yesterday. I cannot see that he has had imaging studies of the right foot. No MRI Past medical history includes type 2 diabetes, in 2021 and ulcer of the left foot that ultimately healed with a wound VAC, stage III chronic kidney disease, first toe amputations bilaterally, hypertension, still a 1/2 pack/day smoker. The patient had ABIs on 08/09/2018 which was 1.00 on the right and 0.96 on the left both areas had triphasic waveforms. Not felt to have an arterial issue. 09/12/2020; 1 week follow-up. He has been doing the dressings along with home health. This is a deep punched out area in the incision of his transmit site. We use silver alginate last week to help with drainage and antibacterial properties. He tolerated this well 6/24; the patient was actually at his podiatrist this morning who debrided the wound. We have  been using silver alginate. They are going to follow him in 6 weeks. 6/29; patient is using silver alginate. Apparently saw Dr. March Rummage this week although I have not checked his note I will try to do so. He is following up in 6 weeks. This was the original transmetatarsal amputation when I first saw this 3 mid part of his amputation site was deep down to bone however this seems to have progressively close down which is gratifying to see. He still has a fairly callus uneven surface however spreading this apart its difficult to identify anything that does not look epithelialized. When I first saw this I thought he would require an extensive debridement perhaps back in the OR by Dr. March Rummage however all of this appears to be looking better and at this point I would continue with the silver alginate see if this holds together over the next several weeks. He does not have an arterial issue 7/13; 2-week follow-up. Unfortunately the area did not hold together there was drainage on his dressing. Still copious amounts of callus. It was difficult to see where this actually was open therefore I elected to go ahead with a vigorous debridement. Still using silver alginate 7/20 I brought him back after extensive callus debridement last week. The only open area was on the medial part of the foot extensive debridement of thick surrounding callus and I was able to do identify the remaining tunneling area. 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 This is the area that we may have been following. This is smaller this week but still surrounded with thick callus He came in today with a large area of callus on the left midfoot. This I had noticed previously. HOWEVER our intake nurse clearly incorrectly documented this as opening. The callus and split there was an odor so all of this had to be removed. 10/29/2020 unfortunately upon evaluation today this patient  appears to have significant mount of callus noted at this point. There does not appear to be any signs of active infection systemically which is great although locally he is having some discomfort around the lateral  portion of his foot on the right. The left foot is no having a lot of callus I am not certain even has an open wound if there is anything is extremely tiny. 11/05/2020 upon evaluation today patient's wounds actually appear to be doing a bit better compared to last week since we have uncovered some of these areas I think there are some definite improvements. With that being said there is still quite a bit of tunneling and undermining at many locations that again has me concerned I still think that the MRI is the best thing to do he has that scheduled for Saturday. 11/26/2020 upon evaluation today patient appears to be doing decently well in regard to his foot in general. He did have an MRI I did review this and he had multiple findings on MRI consistent with osteomyelitis across the board in regard pretty much to his remaining metatarsal regions. Nonetheless there was also some evidence of some involvement of the navicular bone as well. Either way I think that this patient actually is in desperate need of get him in for hyperbaric oxygen therapy we discussed that today that will be detailed in the plan. Nonetheless he is also still on the doxycycline which I think is appropriate for the time being as well I did go ahead and place him on that following the results of the MRI based on what we were seeing. I gave him that prescription for 14 days for the time being and we will go from there. 12/03/2020 upon evaluation today patient appears to be doing decently well in regard to his wounds in general in regard to the right foot. With that being said he does have a callus on the left foot in the end there was actually an area open area here that will need to be addressed. This is small and I think we  take care of it now hopefully will heal quite readily. Fortunately I do not see any signs of infection worsening I am getting need to refill the antibiotic for him today. He has been taking doxycycline which seems to be doing well. We will also get a go ahead and get him approved for HBO therapy. 12/31/2020 upon evaluation today patient appears to be doing really roughly the same as compared to last time I saw him. Fortunately there is no evidence of active infection which is great news systemically he has started hyperbarics he seems to be doing decently well he said a couple times where anxiety kind of got to him but overall I think he is doing awesome. He is no longer on the antibiotic therapy. With that being said I do believe that he is going to need likely some additional antibiotics based on what I am seeing today we can actually obtain a bone culture from the plantar wound that was hiding underneath the callus. He was having pain in this area which is what may be try to clear away the callus to look sure enough there was an actual opening that went deeper into the wound bed region here. Objective Constitutional Well-nourished and well-hydrated in no acute distress. Vitals Time Taken: 11:10 AM, Height: 64 in, Weight: 188 lbs, BMI: 32.3, Temperature: 97.8 F, Pulse: 92 bpm, Respiratory Rate: 18 breaths/min, Blood Pressure: 129/85 mmHg, Capillary Blood Glucose: 197 mg/dl. Respiratory normal breathing without difficulty. Psychiatric this patient is able to make decisions and demonstrates good insight into disease process. Alert and Oriented x 3. pleasant and cooperative. General Notes: Patient's wound  bed on the right foot actually showed a lateral wound that was still open as well as the plantar wound which previously I thought had closed up but nonetheless still was open and hiding applied. This is where he was somewhat painful. In regard to the left foot before debridement I did change  gloves and instruments so that would not pass anything along I did perform some of the debridement to clear away callus around the edges of the wound he tolerated that today without complication and postdebridement the wound bed appears to be doing much better this is very close to complete closure. Integumentary (Hair, Skin) Wound #3 status is Open. Original cause of wound was Gradually Appeared. The date acquired was: 10/29/2020. The wound has been in treatment 9 weeks. The wound is located on the Right,Proximal,Lateral Foot. The wound measures 0.7cm length x 0.2cm width x 1.4cm depth; 0.11cm^2 area and 0.154cm^3 volume. There is Fat Layer (Subcutaneous Tissue) exposed. There is no tunneling or undermining noted. There is a medium amount of serosanguineous drainage noted. The wound margin is thickened. There is large (67-100%) red granulation within the wound bed. There is no necrotic tissue within the wound bed. Wound #6 status is Open. Original cause of wound was Gradually Appeared. The date acquired was: 12/03/2020. The wound has been in treatment 4 weeks. The wound is located on the [place]. The wound measures 0.8cm length x 0.4cm width x 0.2cm depth; 0.251cm^2 area and 0.05cm^3 volume. There is Fat Layer (Subcutaneous Tissue) exposed. There is no tunneling noted, however, there is undermining starting at 6:00 and ending at 12:00 with a maximum distance of 0.3cm. There is a small amount of serosanguineous drainage noted. The wound margin is thickened. There is large (67-100%) red granulation within the wound bed. There is no necrotic tissue within the wound bed. Wound #7 status is Open. Original cause of wound was Gradually Appeared. The date acquired was: 12/31/2020. The wound is located on the [place]. The wound measures 0.5cm length x 0.5cm width x 1cm depth; 0.196cm^2 area and 0.196cm^3 volume. There is bone and Fat Layer (Subcutaneous Tissue) exposed. There is no  tunneling noted, however, there is undermining starting at 12:00 and ending at 12:00 with a maximum distance of 1.9cm. There is a medium amount of serosanguineous drainage noted. The wound margin is distinct with the outline attached to the wound base. There is large (67-100%) red granulation within the wound bed. There is a small (1-33%) amount of necrotic tissue within the wound bed including Adherent Slough. Assessment Active Problems ICD-10 Other chronic osteomyelitis, right ankle and foot Type 2 diabetes mellitus with foot ulcer Non-pressure chronic ulcer of other part of right foot with necrosis of bone Disruption of external operation (surgical) wound, not elsewhere classified, sequela Nicotine dependence, cigarettes, with other nicotine-induced disorders Non-pressure chronic ulcer of other part of left foot with fat layer exposed Procedures Wound #3 Pre-procedure diagnosis of Wound #3 is a Diabetic Wound/Ulcer of the Lower Extremity located on the Right,Proximal,Lateral Foot .Severity of Tissue Pre Debridement is: Fat layer exposed. There was a Excisional Skin/Subcutaneous Tissue Debridement with a total area of 1 sq cm performed by Worthy Keeler, PA. With the following instrument(s): Curette to remove Viable and Non-Viable tissue/material. Material removed includes Callus, Subcutaneous Tissue, Slough, and Skin: Epidermis. No specimens were taken. A time out was conducted at 11:55, prior to the start of the procedure. A Minimum amount of bleeding was controlled with Pressure. The procedure was tolerated  well with a pain level of 0 throughout and a pain level of 0 following the procedure. Post Debridement Measurements: 0.7cm length x 0.3cm width x 1.4cm depth; 0.231cm^3 volume. Character of Wound/Ulcer Post Debridement is improved. Severity of Tissue Post Debridement is: Fat layer exposed. Post procedure Diagnosis Wound #3: Same as Pre-Procedure Wound #6 Pre-procedure diagnosis of  Wound #6 is a Diabetic Wound/Ulcer of the Lower Extremity located on the Left,Plantar Foot .Severity of Tissue Pre Debridement is: Fat layer exposed. There was a Excisional Skin/Subcutaneous Tissue Debridement with a total area of 0.91 sq cm performed by Worthy Keeler, PA. With the following instrument(s): Curette to remove Viable and Non-Viable tissue/material. Material removed includes Callus, Subcutaneous Tissue, Slough, and Skin: Epidermis. No specimens were taken. A time out was conducted at 11:55, prior to the start of the procedure. A Minimum amount of bleeding was controlled with Pressure. The procedure was tolerated well with a pain level of 0 throughout and a pain level of 0 following the procedure. Post Debridement Measurements: 0.9cm length x 0.4cm width x 0.2cm depth; 0.057cm^3 volume. Character of Wound/Ulcer Post Debridement is improved. Severity of Tissue Post Debridement is: Fat layer exposed. Post procedure Diagnosis Wound #6: Same as Pre-Procedure Wound #7 Pre-procedure diagnosis of Wound #7 is a Diabetic Wound/Ulcer of the Lower Extremity located on the Right,Lateral,Plantar Foot .Severity of Tissue Pre Debridement is: Necrosis of bone. There was a Excisional Skin/Subcutaneous Tissue/Muscle/Bone Debridement with a total area of 6 sq cm performed by Worthy Keeler, PA. With the following instrument(s): Curette to remove Viable and Non-Viable tissue/material. Material removed includes Bone,Callus, Subcutaneous Tissue, Slough, and Skin: Epidermis. 1 specimen was taken by a Tissue Culture and sent to the lab per facility protocol. A time out was conducted at 11:55, prior to the start of the procedure. A Minimum amount of bleeding was controlled with Pressure. The procedure was tolerated well with a pain level of 0 throughout and a pain level of 0 following the procedure. Post Debridement Measurements: 0.5cm length x 0.5cm width x 1cm depth; 0.196cm^3 volume. Character of  Wound/Ulcer Post Debridement is improved. Severity of Tissue Post Debridement is: Necrosis of bone. Post procedure Diagnosis Wound #7: Same as Pre-Procedure Plan Follow-up Appointments: Return Appointment in 2 weeks. - with Margarita Grizzle Nurse Visit: - Fri 10/7 and Wed10/12 after HBO Bathing/ Shower/ Hygiene: May shower and wash wound with soap and water. - on days you change dressing Edema Control - Lymphedema / SCD / Other: Elevate legs to the level of the heart or above for 30 minutes daily and/or when sitting, a frequency of: Avoid standing for long periods of time. Moisturize legs daily. - to foot with dressing changes Off-Loading: Open toe surgical shoe to: - Wear at all times except driving. on right foot Other: - may wear shoe with memory foam insert to left foot Additional Orders / Instructions: Stop/Decrease Smoking Follow Nutritious Diet Non Wound Condition: Other Non Wound Condition Orders/Instructions: - cushion bottom of left foot with orthopedic felt or foam Home Health: Admit to [facility] for wound care. May utilize formulary equivalent dressing for wound treatment orders unless otherwise specified. Dressing changes to be completed by [facility] on Monday / Wednesday / Friday except when patient has scheduled visit at [facility]. Hyperbaric Oxygen Therapy: Indication: - wagner grade 3 diabetic foot ulcer of right foot If appropriate for treatment, begin HBOT per protocol: 2.5 ATA for 90 Minutes with 2 Five (5) Minute Air Breaks T Number of  Treatments: - 40 otal One treatments per day (delivered Monday through Friday unless otherwise specified in Special Instructions below): Finger stick Blood Glucose Pre- and Post- HBOT Treatment. Follow Hyperbaric Oxygen Glycemia Protocol Afrin (Oxymetazoline HCL) 0.05% nasal spray - 1 spray in both nostrils daily as needed prior to HBO treatment for difficulty clearing ears Laboratory ordered were: Anerobic culture bone - right  lateral plantar foot WOUND #3: - Foot Wound Laterality: Right, Lateral, Proximal Cleanser: Soap and Water Every Other Day/30 Days Discharge Instructions: May shower and wash wound with dial antibacterial soap and water prior to dressing change. Cleanser: Wound Cleanser Every Other Day/30 Days Discharge Instructions: Cleanse the wound with wound cleanser prior to applying a clean dressing using gauze sponges, not tissue or cotton balls. Peri-Wound Care: Sween Lotion (Moisturizing lotion) Every Other Day/30 Days Discharge Instructions: Apply moisturizing lotion to foot with dressing changes Prim Dressing: Hydrofera Blue Classic Foam, 2x2 in Every Other Day/30 Days ary Discharge Instructions: rope Moisten with saline prior to applying to wound bed Secondary Dressing: Woven Gauze Sponge, Non-Sterile 4x4 in (Generic) Every Other Day/30 Days Discharge Instructions: Apply over primary dressing as directed. Secondary Dressing: ABD Pad, 5x9 Every Other Day/30 Days Discharge Instructions: Apply over primary dressing as directed. Secured With: Coban Self-Adherent Wrap 4x5 (in/yd) (Generic) Every Other Day/30 Days Discharge Instructions: Secure with Coban as directed. Secured With: [employer], 4.5x3.1 (in/yd) (Generic) Every Other Day/30 Days Discharge Instructions: Secure with Kerlix as directed. Secured With: 39M Medipore H Soft Cloth Surgical T ape, 2x2 (in/yd) (Generic) Every Other Day/30 Days Discharge Instructions: Secure dressing with tape as directed. WOUND #6: - Foot Wound Laterality: Plantar, Left Cleanser: Soap and Water Every Other Day/30 Days Discharge Instructions: May shower and wash wound with dial antibacterial soap and water prior to dressing change. Cleanser: Wound Cleanser Every Other Day/30 Days Discharge Instructions: Cleanse the wound with wound cleanser prior to applying a clean dressing using gauze sponges, not tissue or cotton balls. Peri-Wound Care: Sween Lotion  (Moisturizing lotion) Every Other Day/30 Days Discharge Instructions: Apply moisturizing lotion to foot with dressing changes Prim Dressing: Hydrofera Blue Classic Foam, 2x2 in Every Other Day/30 Days ary Discharge Instructions: Moisten with saline prior to applying to wound bed Secondary Dressing: Woven Gauze Sponge, Non-Sterile 4x4 in (Generic) Every Other Day/30 Days Discharge Instructions: Apply over primary dressing as directed. Secondary Dressing: Optifoam Non-Adhesive Dressing, 4x4 in (Generic) Every Other Day/30 Days Discharge Instructions: Apply over primary dressing cut to make foam donut Secured With: Coban Self-Adherent Wrap 4x5 (in/yd) (Generic) Every Other Day/30 Days Discharge Instructions: Secure with Coban as directed. Secured With: [employer], 4.5x3.1 (in/yd) (Generic) Every Other Day/30 Days Discharge Instructions: Secure with Kerlix as directed. Secured With: 39M Medipore H Soft Cloth Surgical T ape, 2x2 (in/yd) (Generic) Every Other Day/30 Days Discharge Instructions: Secure dressing with tape as directed. WOUND #7: - Foot Wound Laterality: Plantar, Right, Lateral Cleanser: Soap and Water Every Other Day/30 Days Discharge Instructions: May shower and wash wound with dial antibacterial soap and water prior to dressing change. Cleanser: Wound Cleanser Every Other Day/30 Days Discharge Instructions: Cleanse the wound with wound cleanser prior to applying a clean dressing using gauze sponges, not tissue or cotton balls. Peri-Wound Care: Sween Lotion (Moisturizing lotion) Every Other Day/30 Days Discharge Instructions: Apply moisturizing lotion to foot with dressing changes Prim Dressing: Hydrofera Blue Classic Foam, 2x2 in Every Other Day/30 Days ary Discharge Instructions: rope Moisten with saline prior to applying to wound bed Secondary  Dressing: Woven Gauze Sponge, Non-Sterile 4x4 in (Generic) Every Other Day/30 Days Discharge Instructions: Apply over primary  dressing as directed. Secondary Dressing: ABD Pad, 5x9 Every Other Day/30 Days Discharge Instructions: Apply over primary dressing as directed. Secured With: Coban Self-Adherent Wrap 4x5 (in/yd) (Generic) Every Other Day/30 Days Discharge Instructions: Secure with Coban as directed. Secured With: [employer], 4.5x3.1 (in/yd) (Generic) Every Other Day/30 Days Discharge Instructions: Secure with Kerlix as directed. Secured With: 69M Medipore H Soft Cloth Surgical T ape, 2x2 (in/yd) (Generic) Every Other Day/30 Days Discharge Instructions: Secure dressing with tape as directed. 1. Would recommend currently that we going continue with the wound care measures as before we are using hyperbaric oxygen therapy I think that still appropriate for this patient. 2. I am also can recommend that we have the patient continue with the [facility] that I do feel like is doing a good job for him as well. 3. I am also can recommend an ABD pad to cover followed by roll gauze to secure in place. 4. I did obtain a bone culture from the plantar wound right foot today. This was on debridement. Subsequently I am to see what this culture shows and depending on the results of the culture will initiate antibiotics as necessary right now I do not wanted to start him on something without having a more directed treatment option here for him. We will see patient back for reevaluation in 2 weeks here in the clinic. If anything worsens or changes patient will contact our office for additional recommendations. Electronic Signature(s) Signed: 12/31/2020 1:41:14 PM By: Worthy Keeler PA-C Entered By: Worthy Keeler on 12/31/2020 13:41:13 -------------------------------------------------------------------------------- SuperBill Details Patient Name: Date of Service: [name], [name] 12/31/2020 Medical Record Number: [id number] Patient Account Number: [id number] Date of Birth/Sex: Treating RN: 1970-08-07 (50 y.o.  [name] Primary Care Provider: [name] Other Clinician: Referring Provider: Treating Provider/Extender: [name], [name] in Treatment: 16 Diagnosis Coding ICD-10 Codes Code Description [phone] Other chronic osteomyelitis, right ankle and foot E11.621 Type 2 diabetes mellitus with foot ulcer L97.514 Non-pressure chronic ulcer of other part of right foot with necrosis of bone T81.31XS Disruption of external operation (surgical) wound, not elsewhere classified, sequela F17.218 Nicotine dependence, cigarettes, with other nicotine-induced disorders L97.522 Non-pressure chronic ulcer of other part of left foot with fat layer exposed Facility Procedures CPT4 Code: 46803212 Description: 11042 - DEB SUBQ TISSUE 20 SQ CM/< ICD-10 Diagnosis Description 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 Modifier: Quantity: 1 CPT4 Code: 24825003 Description: 11044 - DEB BONE 20 SQ CM/< ICD-10 Diagnosis Description L97.514 Non-pressure chronic ulcer of other part of right foot with necrosis of bone Modifier: Quantity: 1 Physician Procedures CPT4: Description Modifier Code [id number] 16945 - WC PHYS LEVEL 4 - EST PT 25 ICD-10 Diagnosis Description M86.671 Other chronic osteomyelitis, right ankle and foot E11.621 Type 2 diabetes mellitus with foot ulcer L97.514 Non-pressure chronic ulcer of  other part of right foot with necrosis of bone T81.31XS Disruption of external operation (surgical) wound, not elsewhere classified, sequela Quantity: 1 CPT4: 0388828 11042 - WC PHYS SUBQ TISS 20 SQ CM ICD-10 Diagnosis Description 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

## 2021-01-01 ENCOUNTER — Encounter (HOSPITAL_BASED_OUTPATIENT_CLINIC_OR_DEPARTMENT_OTHER): Payer: 59 | Admitting: Internal Medicine

## 2021-01-01 ENCOUNTER — Other Ambulatory Visit: Payer: Self-pay

## 2021-01-01 NOTE — Progress Notes (Signed)
Joseph, [name] ([id number]) Visit Report for 12/31/2020 Arrival Information Details Patient Name: Date of Service: [place] RD, [name] 12/31/2020 10:15 A M Medical Record Number: [id number] Patient Account Number: [id number] Date of Birth/Sex: Treating [name]: [birth year] (50 y.o. [name], [name] Primary Care [name]: [name], [place] Other Clinician: Referring [name]: Treating [name]/Extender: [name], [name] in Treatment: 16 Visit Information History Since Last Visit Added or deleted any medications: No Patient Arrived: [name] Any new allergies or adverse reactions: No Arrival Time: 11:23 Had a fall or experienced change in No Accompanied By: self activities of daily living that may affect Transfer Assistance: None risk of falls: Patient Identification Verified: Yes Signs or symptoms of abuse/neglect since last visito No Secondary Verification Process Completed: Yes Hospitalized since last visit: No Patient Requires Transmission-Based Precautions: No Implantable device outside of the clinic excluding No Patient Has Alerts: No cellular tissue based products placed in the center since last visit: Has Dressing in Place as Prescribed: Yes Pain Present Now: Yes Electronic Signature(s) Signed: 01/01/2021 6:17:04 PM By: Joseph Gouty [name], BSN Entered By: [name] on 12/31/2020 11:23:37 -------------------------------------------------------------------------------- Encounter Discharge Information Details Patient Name: Date of Service: [name] RD, [name] 12/31/2020 10:15 A M Medical Record Number: [id number] Patient Account Number: [id number] Date of Birth/Sex: Treating [name]: 06/01/1970 (50 y.o. [name] Primary Care [name]: [name] Other Clinician: Referring [name]: Treating [name]/Extender: [name], [name] in Treatment: [phone] Encounter Discharge Information Items Post Procedure Vitals Discharge Condition:  Stable Temperature (F): 97.8 Ambulatory Status: Cane Pulse (bpm): 92 Discharge Destination: Home Respiratory Rate (breaths/min): 18 Transportation: Private Auto Blood Pressure (mmHg): 129/85 Accompanied By: self Schedule Follow-up Appointment: Yes Clinical Summary of Care: Patient Declined Electronic Signature(s) Signed: 01/01/2021 6:17:04 PM By: Joseph Gouty [name], BSN Entered By: [name] on 12/31/2020 12:33:56 -------------------------------------------------------------------------------- Lower Extremity Assessment Details Patient Name: Date of Service: [name], [name] 12/31/2020 10:15 A M Medical Record Number: [id number] Patient Account Number: [id number] Date of Birth/Sex: Treating [name]: 11-Apr-1970 (50 y.o. [name] Primary Care Denyla Cortese: [name] Other Clinician: Referring [name]: Treating [name]/Extender: [name], [name] in Treatment: 16 Edema Assessment Assessed: [Left: No] [Right: No] Edema: [Left: No] [Right: No] Calf Left: Right: Point of Measurement: From Medial Instep 34.7 cm 33 cm Ankle Left: Right: Point of Measurement: From Medial Instep 23.5 cm 23 cm Vascular Assessment Pulses: Dorsalis Pedis Palpable: [Left:No] [Right:Yes] Electronic Signature(s) Signed: 01/01/2021 6:17:04 PM By: Joseph Gouty [name], BSN Entered By: [name] on 12/31/2020 11:28:58 -------------------------------------------------------------------------------- Multi-Disciplinary Care Plan Details Patient Name: Date of Service: [name] RD, [name] 12/31/2020 10:15 A M Medical Record Number: [id number] Patient Account Number: [id number] Date of Birth/Sex: Treating [name]: 1970/05/18 (50 y.o. [name] Primary Care Malic Rosten: [name] Other Clinician: Referring Tristen Luce: Treating Jaythen Hamme/Extender: [name], [name] in Treatment: [phone] Active Inactive Osteomyelitis Nursing Diagnoses: Infection:  osteomyelitis Knowledge deficit related to disease process and management Goals: Patient/caregiver will verbalize understanding of disease process and disease management Date Initiated: 11/26/2020 Target Resolution Date: 01/21/2021 Goal Status: Active Patient's osteomyelitis will resolve Date Initiated: 11/26/2020 Target Resolution Date: 01/21/2021 Goal Status: Active Interventions: Assess for signs and symptoms of osteomyelitis resolution every visit Provide education on osteomyelitis Screen for HBO Treatment Activities: Systemic antibiotics : 11/26/2020 Notes: Wound/Skin Impairment Nursing Diagnoses: Impaired tissue integrity Goals: Patient/caregiver will verbalize understanding of skin care regimen Date Initiated: 09/05/2020 Target Resolution Date: 01/21/2021 Goal Status: Active Ulcer/skin breakdown  will have a volume reduction of 30% by week 4 Date Initiated: 09/05/2020 Date Inactivated: 10/08/2020 Target Resolution Date: 10/03/2020 Goal Status: Met Interventions: Assess patient/caregiver ability to obtain necessary supplies Assess patient/caregiver ability to perform ulcer/skin care regimen upon admission and as needed Assess ulceration(s) every visit Provide education on ulcer and skin care Treatment Activities: Patient referred to home care : 09/05/2020 Skin care regimen initiated : 09/05/2020 Topical wound management initiated : 09/05/2020 Notes: Electronic Signature(s) Signed: 01/01/2021 6:17:04 PM By: Joseph Gouty [name], BSN Entered By: [name] on 12/31/2020 11:35:14 -------------------------------------------------------------------------------- Pain Assessment Details Patient Name: Date of Service: [name] RD, [name] 12/31/2020 10:15 A M Medical Record Number: [id number] Patient Account Number: [id number] Date of Birth/Sex: Treating [name]: January 23, 1971 (50 y.o. [name] Primary Care Layza Summa: [name] Other Clinician: Referring  Tarick Parenteau: Treating Merlina Marchena/Extender: [name], [name] in Treatment: 16 Active Problems Location of Pain Severity and Description of Pain Patient Has Paino Yes Site Locations Pain Location: Pain Location: Generalized Pain, Pain in Ulcers With Dressing Change: No Duration of the Pain. Constant / Intermittento Intermittent Rate the pain. Current Pain Level: 3 Worst Pain Level: 6 Least Pain Level: 0 Character of Pain Describe the Pain: Throbbing Pain Management and Medication Current Pain Management: Rest: Yes Is the Current Pain Management Adequate: Adequate How does your wound impact your activities of daily livingo Sleep: No Bathing: No Appetite: No Relationship With Others: No Bladder Continence: No Emotions: No Bowel Continence: No Work: No Toileting: No Drive: No Dressing: No Hobbies: No Electronic Signature(s) Signed: 01/01/2021 6:17:04 PM By: Joseph Gouty [name], BSN Entered By: [name] on 12/31/2020 11:24:46 -------------------------------------------------------------------------------- Patient/Caregiver Education Details Patient Name: Date of Service: [name] RD, [name] 10/5/2022andnbsp10:15 A M Medical Record Number: [id number] Patient Account Number: [id number] Date of Birth/Gender: Treating [name]: [birth year] (50 y.o. [name] Primary Care Physician: [name] Other Clinician: Referring Physician: Treating Physician/Extender: [name], [name] in Treatment: 16 Education Assessment Education Provided To: Patient Education Topics Provided Hyperbaric Oxygenation: Methods: Explain/Verbal Responses: Reinforcements needed, State content correctly Infection: Methods: Explain/Verbal Responses: Reinforcements needed, State content correctly Offloading: Methods: Explain/Verbal Responses: Reinforcements needed, State content correctly Wound/Skin Impairment: Methods: Explain/Verbal Responses:  Reinforcements needed, State content correctly Electronic Signature(s) Signed: 01/01/2021 6:17:04 PM By: Joseph Gouty [name], BSN Entered By: [name] on 12/31/2020 11:37:08 -------------------------------------------------------------------------------- Wound Assessment Details Patient Name: Date of Service: [name] RD, [name] 12/31/2020 10:15 A M Medical Record Number: [id number] Patient Account Number: [id number] Date of Birth/Sex: Treating [name]: October 11, 1970 (50 y.o. [name] Primary Care Claudio Mondry: [name] Other Clinician: Referring Lugene Beougher: Treating Berlynn Warsame/Extender: [name], [name] in Treatment: 16 Wound Status Wound Number: 3 Primary Diabetic Wound/Ulcer of the Lower Extremity Etiology: Wound Location: Right, Proximal, Lateral Foot Wound Open Wounding Event: Gradually Appeared Status: Date Acquired: 10/29/2020 Comorbid Anemia, Coronary Artery Disease, Hypertension, Type II [name] Of Treatment: 9 History: Diabetes, Osteomyelitis, Neuropathy Clustered Wound: No Wound Measurements Length: (cm) 0.7 Width: (cm) 0.2 Depth: (cm) 1.4 Area: (cm) 0.11 Volume: (cm) 0.154 % Reduction in Area: 12.7% % Reduction in Volume: -75% Epithelialization: Small (1-33%) Tunneling: No Undermining: No Wound Description Classification: Grade 3 Wound Margin: Thickened Exudate Amount: Medium Exudate Type: Serosanguineous Exudate Color: red, brown Foul Odor After Cleansing: No Slough/Fibrino No Wound Bed Granulation Amount: Large (67-100%) Exposed Structure Granulation Quality: Red Fascia Exposed: No Necrotic Amount: None Present (0%) Fat Layer (Subcutaneous Tissue) Exposed: Yes Tendon Exposed: No Muscle Exposed:  No Joint Exposed: No Bone Exposed: No Treatment Notes Wound #3 (Foot) Wound Laterality: Right, Lateral, Proximal Cleanser Soap and Water Discharge Instruction: May shower and wash wound with dial antibacterial soap and water prior  to dressing change. Wound Cleanser Discharge Instruction: Cleanse the wound with wound cleanser prior to applying a clean dressing using gauze sponges, not tissue or cotton balls. Peri-Wound Care Sween Lotion (Moisturizing lotion) Discharge Instruction: Apply moisturizing lotion to foot with dressing changes Topical Primary Dressing Hydrofera Blue Classic Foam, 2x2 in Discharge Instruction: rope Moisten with saline prior to applying to wound bed Secondary Dressing Woven Gauze Sponge, Non-Sterile 4x4 in Discharge Instruction: Apply over primary dressing as directed. ABD Pad, 5x9 Discharge Instruction: Apply over primary dressing as directed. Secured With [employer] 4x5 (in/yd) Discharge Instruction: Secure with Coban as directed. Kerlix Roll Sterile, 4.5x3.1 (in/yd) Discharge Instruction: Secure with Kerlix as directed. 726M Medipore H Soft Cloth Surgical T ape, 2x2 (in/yd) Discharge Instruction: Secure dressing with tape as directed. Compression Wrap Compression Stockings Add-Ons Electronic Signature(s) Signed: 01/01/2021 6:17:04 PM By: Joseph Gouty [name], BSN Entered By: [name] on 12/31/2020 11:33:47 -------------------------------------------------------------------------------- Wound Assessment Details Patient Name: Date of Service: [name] RD, [name] 12/31/2020 10:15 A M Medical Record Number: [id number] Patient Account Number: [id number] Date of Birth/Sex: Treating [name]: [birth year] (50 y.o. [name] Primary Care Christapher Gillian: [name] Other Clinician: Referring Cleburne Savini: Treating Shaneisha Burkel/Extender: [name], [name] in Treatment: 16 Wound Status Wound Number: 6 Primary Diabetic Wound/Ulcer of the Lower Extremity Etiology: Wound Location: Left, Plantar Foot Wound Open Wounding Event: Gradually Appeared Status: Date Acquired: 12/03/2020 Comorbid Anemia, Coronary Artery Disease, Hypertension, Type II [name] Of  Treatment: 4 History: Diabetes, Osteomyelitis, Neuropathy Clustered Wound: No Wound Measurements Length: (cm) 0.8 Width: (cm) 0.4 Depth: (cm) 0.2 Area: (cm) 0.251 Volume: (cm) 0.05 % Reduction in Area: 50.1% % Reduction in Volume: 75.1% Epithelialization: None Tunneling: No Undermining: Yes Starting Position (o'clock): 6 Ending Position (o'clock): 12 Maximum Distance: (cm) 0.3 Wound Description Classification: Grade 1 Wound Margin: Thickened Exudate Amount: Small Exudate Type: Serosanguineous Exudate Color: red, brown Foul Odor After Cleansing: No Slough/Fibrino No Wound Bed Granulation Amount: Large (67-100%) Exposed Structure Granulation Quality: Red Fascia Exposed: No Necrotic Amount: None Present (0%) Fat Layer (Subcutaneous Tissue) Exposed: Yes Tendon Exposed: No Muscle Exposed: No Joint Exposed: No Bone Exposed: No Treatment Notes Wound #6 (Foot) Wound Laterality: Plantar, Left Cleanser Soap and Water Discharge Instruction: May shower and wash wound with dial antibacterial soap and water prior to dressing change. Wound Cleanser Discharge Instruction: Cleanse the wound with wound cleanser prior to applying a clean dressing using gauze sponges, not tissue or cotton balls. Peri-Wound Care Sween Lotion (Moisturizing lotion) Discharge Instruction: Apply moisturizing lotion to foot with dressing changes Topical Primary Dressing Hydrofera Blue Classic Foam, 2x2 in Discharge Instruction: Moisten with saline prior to applying to wound bed Secondary Dressing Woven Gauze Sponge, Non-Sterile 4x4 in Discharge Instruction: Apply over primary dressing as directed. Optifoam Non-Adhesive Dressing, 4x4 in Discharge Instruction: Apply over primary dressing cut to make foam donut Secured With Coban Self-Adherent Wrap 4x5 (in/yd) Discharge Instruction: Secure with Coban as directed. Kerlix Roll Sterile, 4.5x3.1 (in/yd) Discharge Instruction: Secure with Kerlix as  directed. 726M Medipore H Soft Cloth Surgical T ape, 2x2 (in/yd) Discharge Instruction: Secure dressing with tape as directed. Compression Wrap Compression Stockings Add-Ons Electronic Signature(s) Signed: 01/01/2021 6:17:04 PM By: Joseph Gouty [name], BSN Entered By: [name] on 12/31/2020 11:34:33 -------------------------------------------------------------------------------- Wound Assessment Details  Patient Name: Date of Service: [place] RD, [name] 12/31/2020 10:15 A M Medical Record Number: [id number] Patient Account Number: [id number] Date of Birth/Sex: Treating [name]: 05-26-1970 (50 y.o. [name] Primary Care Albeiro Trompeter: [name] Other Clinician: Referring Zilah Villaflor: Treating Michaele Amundson/Extender: [name], [name] in Treatment: 16 Wound Status Wound Number: 7 Primary Diabetic Wound/Ulcer of the Lower Extremity Etiology: Wound Location: Right, Lateral, Plantar Foot Wound Open Wounding Event: Gradually Appeared Status: Date Acquired: 12/31/2020 Date Acquired: 12/31/2020 Comorbid Anemia, Coronary Artery Disease, Hypertension, Type II [name] Of Treatment: 0 History: Diabetes, Osteomyelitis, Neuropathy Clustered Wound: No Wound Measurements Length: (cm) 0.5 Width: (cm) 0.5 Depth: (cm) 1 Area: (cm) 0.196 Volume: (cm) 0.196 % Reduction in Area: % Reduction in Volume: Tunneling: No Undermining: Yes Starting Position (o'clock): 12 Ending Position (o'clock): 12 Maximum Distance: (cm) 1.9 Wound Description Classification: Grade 3 Wagner Verification: MRI Wound Margin: Distinct, outline attached Exudate Amount: Medium Exudate Type: Serosanguineous Exudate Color: red, brown Foul Odor After Cleansing: No Slough/Fibrino Yes Wound Bed Granulation Amount: Large (67-100%) Exposed Structure Granulation Quality: Red Fascia Exposed: No Necrotic Amount: Small (1-33%) Fat Layer (Subcutaneous Tissue) Exposed: Yes Necrotic Quality: Adherent  Slough Tendon Exposed: No Muscle Exposed: No Joint Exposed: No Bone Exposed: Yes Treatment Notes Wound #7 (Foot) Wound Laterality: Plantar, Right, Lateral Cleanser Soap and Water Discharge Instruction: May shower and wash wound with dial antibacterial soap and water prior to dressing change. Wound Cleanser Discharge Instruction: Cleanse the wound with wound cleanser prior to applying a clean dressing using gauze sponges, not tissue or cotton balls. Peri-Wound Care Sween Lotion (Moisturizing lotion) Discharge Instruction: Apply moisturizing lotion to foot with dressing changes Topical Primary Dressing Hydrofera Blue Classic Foam, 2x2 in Discharge Instruction: rope Moisten with saline prior to applying to wound bed Secondary Dressing Woven Gauze Sponge, Non-Sterile 4x4 in Discharge Instruction: Apply over primary dressing as directed. ABD Pad, 5x9 Discharge Instruction: Apply over primary dressing as directed. Secured With [employer] 4x5 (in/yd) Discharge Instruction: Secure with Coban as directed. Kerlix Roll Sterile, 4.5x3.1 (in/yd) Discharge Instruction: Secure with Kerlix as directed. 39M Medipore H Soft Cloth Surgical T ape, 2x2 (in/yd) Discharge Instruction: Secure dressing with tape as directed. Compression Wrap Compression Stockings Add-Ons Electronic Signature(s) Signed: 01/01/2021 6:17:04 PM By: Joseph Gouty [name], BSN Signed: 01/01/2021 6:17:04 PM By: Joseph Gouty [name], BSN Entered By: [name] on 12/31/2020 12:11:46 -------------------------------------------------------------------------------- Vitals Details Patient Name: Date of Service: [name] RD, [name] 12/31/2020 10:15 A M Medical Record Number: [id number] Patient Account Number: [id number] Date of Birth/Sex: Treating [name]: 1970-10-12 (50 y.o. [name] Primary Care Dyneshia Baccam: [name] Other Clinician: [name] Referring Tamyah Cutbirth: Treating Matheau Orona/Extender:  [name], [name] in Treatment: 16 Vital Signs Time Taken: 11:10 Temperature (F): 97.8 Height (in): 64 Pulse (bpm): 92 Weight (lbs): 188 Respiratory Rate (breaths/min): 18 Body Mass Index (BMI): 32.3 Blood Pressure (mmHg): 129/85 Capillary Blood Glucose (mg/dl): 197 Reference Range: 80 - 120 mg / dl Electronic Signature(s) Signed: 12/31/2020 12:11:49 PM By: [name] EMT Entered By: [name] on 12/31/2020 11:14:10

## 2021-01-02 ENCOUNTER — Encounter (HOSPITAL_BASED_OUTPATIENT_CLINIC_OR_DEPARTMENT_OTHER): Payer: 59 | Admitting: Internal Medicine

## 2021-01-02 ENCOUNTER — Other Ambulatory Visit: Payer: Self-pay

## 2021-01-02 DIAGNOSIS — D72829 Elevated white blood cell count, unspecified: Secondary | ICD-10-CM | POA: Insufficient documentation

## 2021-01-02 DIAGNOSIS — L97514 Non-pressure chronic ulcer of other part of right foot with necrosis of bone: Secondary | ICD-10-CM

## 2021-01-02 DIAGNOSIS — E11621 Type 2 diabetes mellitus with foot ulcer: Secondary | ICD-10-CM | POA: Diagnosis not present

## 2021-01-02 DIAGNOSIS — M86671 Other chronic osteomyelitis, right ankle and foot: Secondary | ICD-10-CM | POA: Diagnosis not present

## 2021-01-02 LAB — GLUCOSE, CAPILLARY
Glucose-Capillary: 165 mg/dL — ABNORMAL HIGH (ref 70–99)
Glucose-Capillary: 103 mg/dL — ABNORMAL HIGH (ref 70–99)

## 2021-01-02 NOTE — Progress Notes (Signed)
DAQUAN, CRAPPS ([id number]) Visit Report for 12/31/2020 HBO Details Patient Name: Date of Service: [address], [name] 12/31/2020 8:00 A M Medical Record Number: [id number] Patient Account Number: [id number] Date of Birth/Sex: Treating RN: 08-06-1970 (50 y.o. [name] Primary Care Rahil Passey: [name] Other Clinician: [name] Referring Demesha Boorman: Treating Taniah Reinecke/Extender: [name], [name] in Treatment: 16 HBO Treatment Course Details Treatment Course Number: 1 Ordering Eliah Marquard: [name] Treatments Ordered: otal 40 HBO Treatment Start Date: 12/08/2020 HBO Indication: Diabetic Ulcer(s) of the Lower Extremity HBO Treatment Details Treatment Number: 6 Patient Type: Outpatient Chamber Type: Monoplace Chamber Serial #: [id number] Treatment Protocol: 2.5 ATA with 90 minutes oxygen, with two 5 minute air breaks Treatment Details Compression Rate Down: 1.5 psi / minute De-Compression Rate Up: 2.0 psi / minute A breaks and breathing ir Compress Tx Pressure periods Decompress Decompress Begins Reached (leave unused spaces Begins Ends blank) Chamber Pressure (ATA 1 2.5 2.5 2.5 2.5 2.5 - - 2.5 1 ) Clock Time (24 hr) 08:55 09:14 09:44 09:49 10:19 10:24 - - 10:54 11:05 Treatment Length: 130 (minutes) Treatment Segments: 4 Vital Signs Capillary Blood Glucose Reference Range: 80 - 120 mg / dl HBO Diabetic Blood Glucose Intervention Range: <131 mg/dl or >249 mg/dl Type: Time Vitals Blood Pulse: Respiratory Capillary Blood Glucose Pulse Action Temperature: Taken: Pressure: Rate: Glucose (mg/dl): Meter #: Oximetry (%) Taken: Pre 08:19 126 2 8 oz Glucerna Shake given Pre 08:53 132/86 92 20 97.6 155 2 cleared for treatment Pre 11:10 129/85 92 18 197 197 2 Treatment Response Treatment Toleration: Well Treatment Completion Status: Treatment Completed without Adverse Event Additional Procedure Documentation Tissue Sevierity: Fat layer  exposed Electronic Signature(s) Signed: 12/31/2020 12:11:49 PM By: [name] EMT Signed: 01/02/2021 9:30:20 AM By: Worthy Keeler PA-C Entered By: [name] on 12/31/2020 11:17:30 -------------------------------------------------------------------------------- HBO Safety Checklist Details Patient Name: Date of Service: [name], [place]. 12/31/2020 8:00 A M Medical Record Number: [id number] Patient Account Number: [id number] Date of Birth/Sex: Treating RN: [birth year] (50 y.o. [name] Primary Care Caliya Narine: [name] Other Clinician: [name] Referring Dacy Enrico: Treating Jeselle Hiser/Extender: [name], [name] in Treatment: 16 HBO Safety Checklist Items Safety Checklist Consent Form Signed Patient voided / foley secured and emptied When did you last eato Breakfast Last dose of injectable or oral agent n/a Ostomy pouch emptied and vented if applicable NA All implantable devices assessed, documented and approved NA Intravenous access site secured and place NA Valuables secured Linens and cotton and cotton/polyester blend (less than 51% polyester) Personal oil-based products / skin lotions / body lotions removed NA Wigs or hairpieces removed NA Smoking or tobacco materials removed Books / newspapers / magazines / loose paper removed Cologne, aftershave, perfume and deodorant removed Jewelry removed (may wrap wedding band) Make-up removed NA Hair care products removed NA Battery operated devices (external) removed Heating patches and chemical warmers removed Titanium eyewear removed NA Nail polish cured greater than 10 hours NA Casting material cured greater than 10 hours NA Hearing aids removed NA Loose dentures or partials removed NA Prosthetics have been removed NA Patient demonstrates correct use of air break device (if applicable) Patient concerns have been addressed Patient grounding bracelet on and  cord attached to chamber Specifics for Inpatients (complete in addition to above) Medication sheet sent with patient NA Intravenous medications needed or due during therapy sent with patient NA Drainage tubes (e.g. nasogastric tube or chest tube secured and vented)  NA Endotracheal or Tracheotomy tube secured NA Cuff deflated of air and inflated with saline NA Airway suctioned NA Notes Paper version used prior to treatment. Electronic Signature(s) Signed: 12/31/2020 9:20:19 AM By: [name] EMT Entered By: [name] on 12/31/2020 09:15:27

## 2021-01-02 NOTE — Progress Notes (Addendum)
[name], [name] ([id number]) Visit Report for 01/02/2021 HBO Details Patient Name: Date of Service: [address], [place] 01/02/2021 9:00 A M Medical Record Number: [id number] Patient Account Number: [id number] Date of Birth/Sex: Treating RN: May 02, 1970 (50 y.o. [name] Primary Care Rowland Ericsson: [name] Other Clinician: [name] Referring Kamica Florance: Treating Fedra Lanter/Extender: [name], [name] in Treatment: 17 HBO Treatment Course Details Treatment Course Number: 1 Ordering Krissie Merrick: [name] Treatments Ordered: otal 40 HBO Treatment Start Date: 12/08/2020 HBO Indication: Diabetic Ulcer(s) of the Lower Extremity HBO Treatment Details Treatment Number: 7 Patient Type: Outpatient Chamber Type: Monoplace Chamber Serial #: [id number] Treatment Protocol: 2.5 ATA with 90 minutes oxygen, with two 5 minute air breaks Treatment Details Compression Rate Down: 2.0 psi / minute De-Compression Rate Up: 2.0 psi / minute A breaks and breathing ir Compress Tx Pressure periods Decompress Decompress Begins Reached (leave unused spaces Begins Ends blank) Chamber Pressure (ATA 1 2.5 2.5 2.5 2.5 2.5 - - 2.5 1 ) Clock Time (24 hr) 09:15 09:26 09:56 10:01 10:31 10:36 - - 11:06 11:17 Treatment Length: 122 (minutes) Treatment Segments: 4 Vital Signs Capillary Blood Glucose Reference Range: 80 - 120 mg / dl HBO Diabetic Blood Glucose Intervention Range: <131 mg/dl or >249 mg/dl Type: Time Vitals Blood Pulse: Respiratory Temperature: Capillary Blood Glucose Pulse Action Taken: Pressure: Rate: Glucose (mg/dl): Meter #: Oximetry (%) Taken: Pre 08:26 139/85 108 18 98.4 165 2 Post 11:20 128/89 94 18 97.6 103 2 Patient discharged per [facility] protocol. Treatment Response Treatment Toleration: Well Treatment Completion Status: Treatment Completed without Adverse Event Physician HBO Attestation: I certify that I supervised this HBO treatment in accordance  with Medicare guidelines. A trained emergency response team is readily available per Yes hospital policies and procedures. Continue HBOT as ordered. Yes Electronic Signature(s) Signed: 01/05/2021 9:30:42 AM By: Kalman Shan DO Previous Signature: 01/02/2021 1:40:10 PM Version By: [name] EMT Entered By: Kalman Shan on 01/05/2021 09:29:41 -------------------------------------------------------------------------------- HBO Safety Checklist Details Patient Name: Date of Service: [name], [place] 01/02/2021 9:00 A M Medical Record Number: [id number] Patient Account Number: [id number] Date of Birth/Sex: Treating RN: 04-29-1970 (50 y.o. [name] Primary Care Melody Cirrincione: [name] Other Clinician: [name] Referring Annahi Short: Treating Tara Rud/Extender: [name], [name] in Treatment: 17 HBO Safety Checklist Items Safety Checklist Consent Form Signed Patient voided / foley secured and emptied When did you last eato Breakfast Last dose of injectable or oral agent 0800 Ostomy pouch emptied and vented if applicable NA All implantable devices assessed, documented and approved NA Intravenous access site secured and place NA Valuables secured Linens and cotton and cotton/polyester blend (less than 51% polyester) Personal oil-based products / skin lotions / body lotions removed Wigs or hairpieces removed NA Smoking or tobacco materials removed NA Books / newspapers / magazines / loose paper removed Cologne, aftershave, perfume and deodorant removed Jewelry removed (may wrap wedding band) Make-up removed NA Hair care products removed Battery operated devices (external) removed Heating patches and chemical warmers removed Titanium eyewear removed NA Nail polish cured greater than 10 hours NA Casting material cured greater than 10 hours Hearing aids removed NA Loose dentures or partials removed NA Prosthetics have  been removed NA Patient demonstrates correct use of air break device (if applicable) Patient concerns have been addressed Patient grounding bracelet on and cord attached to chamber Specifics for Inpatients (complete in addition to above) Medication sheet sent with patient NA Intravenous medications needed or due  during therapy sent with patient NA Drainage tubes (e.g. nasogastric tube or chest tube secured and vented) NA Endotracheal or Tracheotomy tube secured NA Cuff deflated of air and inflated with saline NA Airway suctioned NA Notes Paper version used prior to treatment. Electronic Signature(s) Signed: 01/02/2021 9:58:48 AM By: [name] EMT Entered By: [name] on 01/02/2021 09:58:48

## 2021-01-02 NOTE — Progress Notes (Signed)
[name], [name] ([id number]) Visit Report for 12/31/2020 SuperBill Details Patient Name: Date of Service: [address], [name] 12/31/2020 Medical Record Number: [id number] Patient Account Number: [id number] Date of Birth/Sex: Treating RN: 01/23/1971 (50 y.o. [name] Primary Care Provider: [name] Other Clinician: [name] Referring Provider: Treating Provider/Extender: [name], [name] in Treatment: 16 Diagnosis Coding ICD-10 Codes Code Description E11.621 Type 2 diabetes mellitus with foot ulcer L97.514 Non-pressure chronic ulcer of other part of right foot with necrosis of bone T81.31XS Disruption of external operation (surgical) wound, not elsewhere classified, sequela L97.528 Non-pressure chronic ulcer of other part of left foot with other specified severity F17.218 Nicotine dependence, cigarettes, with other nicotine-induced disorders Facility Procedures CPT4 Code Description Modifier Quantity [id number] G0277-(Facility Use Only) HBOT full body chamber, 51min , 4 ICD-10 Diagnosis Description E11.621 Type 2 diabetes mellitus with foot ulcer L97.514 Non-pressure chronic ulcer of other part of right foot with necrosis of bone T81.31XS Disruption of external operation (surgical) wound, not elsewhere classified, sequela L97.528 Non-pressure chronic ulcer of other part of left foot with other specified severity Physician Procedures Quantity CPT4 Code Description Modifier [id number] - WC PHYS HYPERBARIC OXYGEN THERAPY 1 ICD-10 Diagnosis Description E11.621 Type 2 diabetes mellitus with foot ulcer L97.514 Non-pressure chronic ulcer of other part of right foot with necrosis of bone T81.31XS Disruption of external operation (surgical) wound, not elsewhere classified, sequela L97.528 Non-pressure chronic ulcer of other part of left foot with other specified severity Electronic Signature(s) Signed: 12/31/2020 12:11:49 PM By: [name] EMT Signed: 01/02/2021 9:30:20 AM By: Worthy Keeler PA-C Entered By: [name] on 12/31/2020 11:17:56

## 2021-01-02 NOTE — Progress Notes (Signed)
  Subjective:  Patient ID: [name], male    DOB: [birth year],  MRN: [id number]  Chief Complaint  Patient presents with   Wound Check    Left plantar callous is painful, right foot check   DOS: 07/29/20 Procedures:             1) Debridement of wound down to and including bone.  50 y.o. male presents with the above complaint. History confirmed with patient.    Objective:  Physical Exam: tenderness at the surgical site, local edema noted and calf supple, nontender. 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 Ulcer of left foot, limited to breakdown of skin [facility])    Plan:  Patient was evaluated and treated and all questions answered.  Right foot wound -Well-healed.  Defer to wound care center  Large callus left foot -Debrided with small ulceration upon debridement. Should heal uneventfully.  Dressed with Silvadene foam border dressing No follow-ups on file.

## 2021-01-02 NOTE — Progress Notes (Signed)
ULES, MARSALA ([id number]) Visit Report for 01/02/2021 Arrival Information Details Patient Name: Date of Service: [name], [name] 01/02/2021 7:30 A M Medical Record Number: [id number] Patient Account Number: [id number] Date of Birth/Sex: Treating RN: 06/09/1970 (50 y.o. [name], [name] Primary Care [name]: [name] Other Clinician: Referring [name]: Treating [name]/Extender: [name], [name] in Treatment: 64 Visit Information History Since Last Visit Added or deleted any medications: No Patient Arrived: Ambulatory Any new allergies or adverse reactions: No Arrival Time: 08:25 Had a fall or experienced change in No Accompanied By: self activities of daily living that may affect Transfer Assistance: None risk of falls: Patient Identification Verified: Yes Signs or symptoms of abuse/neglect since last visito No Secondary Verification Process Completed: Yes Hospitalized since last visit: No Patient Requires Transmission-Based Precautions: No Implantable device outside of the clinic excluding No Patient Has Alerts: No cellular tissue based products placed in the center since last visit: Has Dressing in Place as Prescribed: Yes Pain Present Now: No Electronic Signature(s) Signed: 01/02/2021 12:34:01 PM By: Baruch Gouty RN, BSN Entered By: Baruch Gouty on 01/02/2021 08:26:18 -------------------------------------------------------------------------------- Clinic Level of Care Assessment Details Patient Name: Date of Service: [name], [name] 01/02/2021 7:30 A M Medical Record Number: [id number] Patient Account Number: [id number] Date of Birth/Sex: Treating RN: [birth year] (50 y.o. [name] Primary Care [name]: [name] Other Clinician: Referring [name]: Treating [name]/Extender: [name], [name] in Treatment: 17 Clinic Level of Care Assessment Items TOOL 4 Quantity Score []  - 0 Use when  only an EandM is performed on FOLLOW-UP visit ASSESSMENTS - Nursing Assessment / Reassessment X- 1 10 Reassessment of Co-morbidities (includes updates in patient status) X- 1 5 Reassessment of Adherence to Treatment Plan ASSESSMENTS - Wound and Skin A ssessment / Reassessment []  - 0 Simple Wound Assessment / Reassessment - one wound X- 2 5 Complex Wound Assessment / Reassessment - multiple wounds []  - 0 Dermatologic / Skin Assessment (not related to wound area) ASSESSMENTS - Focused Assessment []  - 0 Circumferential Edema Measurements - multi extremities []  - 0 Nutritional Assessment / Counseling / Intervention []  - 0 Lower Extremity Assessment (monofilament, tuning fork, pulses) []  - 0 Peripheral Arterial Disease Assessment (using hand held doppler) ASSESSMENTS - Ostomy and/or Continence Assessment and Care []  - 0 Incontinence Assessment and Management []  - 0 Ostomy Care Assessment and Management (repouching, etc.) PROCESS - Coordination of Care X - Simple Patient / Family Education for ongoing care 1 15 []  - 0 Complex (extensive) Patient / Family Education for ongoing care X- 1 10 Staff obtains Programmer, systems, Records, T Results / Process Orders est []  - 0 Staff telephones HHA, Nursing Homes / Clarify orders / etc []  - 0 Routine Transfer to another Facility (non-emergent condition) []  - 0 Routine Hospital Admission (non-emergent condition) []  - 0 New Admissions / Biomedical engineer / Ordering NPWT Apligraf, etc. , []  - 0 Emergency Hospital Admission (emergent condition) X- 1 10 Simple Discharge Coordination []  - 0 Complex (extensive) Discharge Coordination PROCESS - Special Needs []  - 0 Pediatric / Minor Patient Management []  - 0 Isolation Patient Management []  - 0 Hearing / Language / Visual special needs []  - 0 Assessment of Community assistance (transportation, D/C planning, etc.) []  - 0 Additional assistance / Altered mentation []  - 0 Support  Surface(s) Assessment (bed, cushion, seat, etc.) INTERVENTIONS - Wound Cleansing / Measurement []  - 0 Simple Wound Cleansing - one wound X- 2 5 Complex Wound Cleansing -  multiple wounds X- 1 5 Wound Imaging (photographs - any number of wounds) []  - 0 Wound Tracing (instead of photographs) []  - 0 Simple Wound Measurement - one wound X- 2 5 Complex Wound Measurement - multiple wounds INTERVENTIONS - Wound Dressings X - Small Wound Dressing one or multiple wounds 2 10 []  - 0 Medium Wound Dressing one or multiple wounds []  - 0 Large Wound Dressing one or multiple wounds []  - 0 Application of Medications - topical []  - 0 Application of Medications - injection INTERVENTIONS - Miscellaneous []  - 0 External ear exam []  - 0 Specimen Collection (cultures, biopsies, blood, body fluids, etc.) []  - 0 Specimen(s) / Culture(s) sent or taken to Lab for analysis []  - 0 Patient Transfer (multiple staff / Civil Service fast streamer / Similar devices) []  - 0 Simple Staple / Suture removal (25 or less) []  - 0 Complex Staple / Suture removal (26 or more) []  - 0 Hypo / Hyperglycemic Management (close monitor of Blood Glucose) []  - 0 Ankle / Brachial Index (ABI) - do not check if billed separately X- 1 5 Vital Signs Has the patient been seen at the hospital within the last three years: Yes Total Score: 110 Level Of Care: New/Established - Level 3 Electronic Signature(s) Signed: 01/02/2021 12:34:01 PM By: Baruch Gouty RN, BSN Entered By: Baruch Gouty on 01/02/2021 10:36:48 -------------------------------------------------------------------------------- Encounter Discharge Information Details Patient Name: Date of Service: [name] RD, [name] 01/02/2021 7:30 A M Medical Record Number: [id number] Patient Account Number: [id number] Date of Birth/Sex: Treating RN: 24-Aug-1970 (50 y.o. [name] Primary Care [place]: [name] Other Clinician: Referring [name]: Treating  [name]/Extender: [name], [name] in Treatment: 17 Encounter Discharge Information Items Discharge Condition: Stable Ambulatory Status: Cane Discharge Destination: Home Transportation: Private Auto Accompanied By: self Schedule Follow-up Appointment: Yes Clinical Summary of Care: Patient Declined Electronic Signature(s) Signed: 01/02/2021 12:34:01 PM By: Baruch Gouty RN, BSN Entered By: Baruch Gouty on 01/02/2021 10:38:25 -------------------------------------------------------------------------------- Patient/Caregiver Education Details Patient Name: Date of Service: [name] RD, [place] 10/7/2022andnbsp7:30 A M Medical Record Number: [id number] Patient Account Number: [id number] Date of Birth/Gender: Treating RN: [birth year] (50 y.o. [name] Primary Care Physician: [name] Other Clinician: Referring Physician: Treating Physician/Extender: [name] Weeks in Treatment: 17 Education Assessment Education Provided To: Patient Education Topics Provided Hyperbaric Oxygenation: Methods: Explain/Verbal Responses: Reinforcements needed, State content correctly Infection: Methods: Explain/Verbal Responses: Reinforcements needed, State content correctly Wound/Skin Impairment: Methods: Explain/Verbal Responses: Reinforcements needed, State content correctly Electronic Signature(s) Signed: 01/02/2021 12:34:01 PM By: Baruch Gouty RN, BSN Entered By: Baruch Gouty on 01/02/2021 10:38:07 -------------------------------------------------------------------------------- Wound Assessment Details Patient Name: Date of Service: [name] RD, [name] 01/02/2021 7:30 A M Medical Record Number: [id number] Patient Account Number: [id number] Date of Birth/Sex: Treating RN: Feb 05, 1971 (50 y.o. [name] Primary Care Akeria Hedstrom: [name] Other Clinician: Referring Ahliyah Nienow: Treating Alka Falwell/Extender: [name], [name] in Treatment: 17 Wound Status Wound Number: 3 Primary Etiology: Diabetic Wound/Ulcer of the Lower Extremity Wound Location: Right, Proximal, Lateral Foot Wound Status: Open Wounding Event: Gradually Appeared Date Acquired: 10/29/2020 Weeks Of Treatment: 9 Clustered Wound: No Wound Measurements Length: (cm) 0.7 Width: (cm) 0.2 Depth: (cm) 1.4 Area: (cm) 0.11 Volume: (cm) 0.154 % Reduction in Area: 12.7% % Reduction in Volume: -75% Wound Description Classification: Grade 3 Exudate Amount: Medium Exudate Type: Serosanguineous Exudate Color: red, brown Treatment Notes Wound #3 (Foot) Wound Laterality: Right, Lateral, Proximal Cleanser Soap and Water Discharge Instruction: May  shower and wash wound with dial antibacterial soap and water prior to dressing change. Wound Cleanser Discharge Instruction: Cleanse the wound with wound cleanser prior to applying a clean dressing using gauze sponges, not tissue or cotton balls. Peri-Wound Care Sween Lotion (Moisturizing lotion) Discharge Instruction: Apply moisturizing lotion to foot with dressing changes Topical Primary Dressing Hydrofera Blue Classic Foam, 2x2 in Discharge Instruction: rope Moisten with saline prior to applying to wound bed Secondary Dressing Woven Gauze Sponge, Non-Sterile 4x4 in Discharge Instruction: Apply over primary dressing as directed. ABD Pad, 5x9 Discharge Instruction: Apply over primary dressing as directed. Secured With [employer] 4x5 (in/yd) Discharge Instruction: Secure with Coban as directed. Kerlix Roll Sterile, 4.5x3.1 (in/yd) Discharge Instruction: Secure with Kerlix as directed. 56M Medipore H Soft Cloth Surgical T ape, 2x2 (in/yd) Discharge Instruction: Secure dressing with tape as directed. Compression Wrap Compression Stockings Add-Ons Electronic Signature(s) Signed: 01/02/2021 12:34:01 PM By: Baruch Gouty RN, BSN Entered By: Baruch Gouty  on 01/02/2021 09:17:32 -------------------------------------------------------------------------------- Wound Assessment Details Patient Name: Date of Service: [name] RD, [place] [name] 01/02/2021 7:30 A M Medical Record Number: [id number] Patient Account Number: [id number] Date of Birth/Sex: Treating RN: 04-25-1970 (50 y.o. [name] Primary Care Jillane Po: [name] Other Clinician: Referring Kemyra August: Treating Daneen Volcy/Extender: [name], [name] in Treatment: 17 Wound Status Wound Number: 6 Primary Etiology: Diabetic Wound/Ulcer of the Lower Extremity Wound Location: Left, Plantar Foot Wound Status: Open Wounding Event: Gradually Appeared Date Acquired: 12/03/2020 Weeks Of Treatment: 4 Clustered Wound: No Wound Measurements Length: (cm) 0.8 Width: (cm) 0.4 Depth: (cm) 0.2 Area: (cm) 0.251 Volume: (cm) 0.05 % Reduction in Area: 50.1% % Reduction in Volume: 75.1% Wound Description Classification: Grade 1 Exudate Amount: Small Exudate Type: Serosanguineous Exudate Color: red, brown Treatment Notes Wound #6 (Foot) Wound Laterality: Plantar, Left Cleanser Soap and Water Discharge Instruction: May shower and wash wound with dial antibacterial soap and water prior to dressing change. Wound Cleanser Discharge Instruction: Cleanse the wound with wound cleanser prior to applying a clean dressing using gauze sponges, not tissue or cotton balls. Peri-Wound Care Sween Lotion (Moisturizing lotion) Discharge Instruction: Apply moisturizing lotion to foot with dressing changes Topical Primary Dressing Hydrofera Blue Classic Foam, 2x2 in Discharge Instruction: Moisten with saline prior to applying to wound bed Secondary Dressing Woven Gauze Sponge, Non-Sterile 4x4 in Discharge Instruction: Apply over primary dressing as directed. Optifoam Non-Adhesive Dressing, 4x4 in Discharge Instruction: Apply over primary dressing cut to make foam donut Secured  With Coban Self-Adherent Wrap 4x5 (in/yd) Discharge Instruction: Secure with Coban as directed. Kerlix Roll Sterile, 4.5x3.1 (in/yd) Discharge Instruction: Secure with Kerlix as directed. 56M Medipore H Soft Cloth Surgical T ape, 2x2 (in/yd) Discharge Instruction: Secure dressing with tape as directed. Compression Wrap Compression Stockings Add-Ons Electronic Signature(s) Signed: 01/02/2021 12:34:01 PM By: Baruch Gouty RN, BSN Entered By: Baruch Gouty on 01/02/2021 09:17:32 -------------------------------------------------------------------------------- Wound Assessment Details Patient Name: Date of Service: [name] RD, [place] [name] 01/02/2021 7:30 A M Medical Record Number: [id number] Patient Account Number: [id number] Date of Birth/Sex: Treating RN: 1970/04/16 (50 y.o. [name] Primary Care Brandilynn Taormina: [name] Other Clinician: Referring Garhett Bernhard: Treating Amed Datta/Extender: [name], [name] in Treatment: 17 Wound Status Wound Number: 7 Primary Etiology: Diabetic Wound/Ulcer of the Lower Extremity Wound Location: Right, Lateral, Plantar Foot Wound Status: Open Wounding Event: Gradually Appeared Date Acquired: 12/31/2020 Weeks Of Treatment: 0 Clustered Wound: No Wound Measurements Length: (cm) 0.5 Width: (cm) 0.5 Depth: (cm) 1 Area: (cm) 0.196 Volume: (  cm) 0.196 % Reduction in Area: 0% % Reduction in Volume: 0% Wound Description Classification: Grade 3 Exudate Amount: Medium Exudate Type: Serosanguineous Exudate Color: red, brown Treatment Notes Wound #7 (Foot) Wound Laterality: Plantar, Right, Lateral Cleanser Soap and Water Discharge Instruction: May shower and wash wound with dial antibacterial soap and water prior to dressing change. Wound Cleanser Discharge Instruction: Cleanse the wound with wound cleanser prior to applying a clean dressing using gauze sponges, not tissue or cotton balls. Peri-Wound Care Sween Lotion  (Moisturizing lotion) Discharge Instruction: Apply moisturizing lotion to foot with dressing changes Topical Primary Dressing Hydrofera Blue Classic Foam, 2x2 in Discharge Instruction: rope Moisten with saline prior to applying to wound bed Secondary Dressing Woven Gauze Sponge, Non-Sterile 4x4 in Discharge Instruction: Apply over primary dressing as directed. ABD Pad, 5x9 Discharge Instruction: Apply over primary dressing as directed. Secured With [employer] 4x5 (in/yd) Discharge Instruction: Secure with Coban as directed. Kerlix Roll Sterile, 4.5x3.1 (in/yd) Discharge Instruction: Secure with Kerlix as directed. 16M Medipore H Soft Cloth Surgical T ape, 2x2 (in/yd) Discharge Instruction: Secure dressing with tape as directed. Compression Wrap Compression Stockings Add-Ons Electronic Signature(s) Signed: 01/02/2021 12:34:01 PM By: Baruch Gouty RN, BSN Entered By: Baruch Gouty on 01/02/2021 09:17:32 -------------------------------------------------------------------------------- [place] Details Patient Name: Date of Service: [name] RD, [place] [name] 01/02/2021 7:30 A M Medical Record Number: [id number] Patient Account Number: [id number] Date of Birth/Sex: Treating RN: 1971-01-15 (50 y.o. [name] Primary Care Tyler Robidoux: [name] Other Clinician: Referring Hernandez Losasso: Treating Maeve Debord/Extender: [name], [name] in Treatment: 17 Vital Signs Time Taken: 08:26 Temperature (F): 98.4 Height (in): 64 Pulse (bpm): 108 Source: Stated Respiratory Rate (breaths/min): 18 Weight (lbs): 188 Blood Pressure (mmHg): 139/85 Source: Stated Reference Range: 80 - 120 mg / dl Body Mass Index (BMI): 32.3 Electronic Signature(s) Signed: 01/02/2021 12:34:01 PM By: Baruch Gouty RN, BSN Entered By: Baruch Gouty on 01/02/2021 08:26:53

## 2021-01-02 NOTE — Progress Notes (Signed)
MORT, SMELSER ([id number]) Visit Report for 01/02/2021 SuperBill Details Patient Name: Date of Service: [place] RD, [name] 01/02/2021 Medical Record Number: [id number] Patient Account Number: [id number] Date of Birth/Sex: Treating RN: October 14, 1970 (50 y.o. [name] Primary Care Provider: [name] Other Clinician: Referring Provider: Treating Provider/Extender: [name], [name] in Treatment: 17 Diagnosis Coding ICD-10 Codes Code Description [phone] Other chronic osteomyelitis, right ankle and foot E11.621 Type 2 diabetes mellitus with foot ulcer L97.514 Non-pressure chronic ulcer of other part of right foot with necrosis of bone T81.31XS Disruption of external operation (surgical) wound, not elsewhere classified, sequela F17.218 Nicotine dependence, cigarettes, with other nicotine-induced disorders L97.522 Non-pressure chronic ulcer of other part of left foot with fat layer exposed Facility Procedures CPT4 Code Description Modifier Quantity [id number] - WOUND CARE VISIT-LEV 3 EST PT 1 Electronic Signature(s) Signed: 01/02/2021 10:52:33 AM By: Kalman Shan DO Signed: 01/02/2021 12:34:01 PM By: Baruch Gouty RN, BSN Entered By: Baruch Gouty on 01/02/2021 10:38:34

## 2021-01-05 ENCOUNTER — Encounter (HOSPITAL_BASED_OUTPATIENT_CLINIC_OR_DEPARTMENT_OTHER): Payer: 59 | Admitting: Internal Medicine

## 2021-01-05 ENCOUNTER — Other Ambulatory Visit: Payer: Self-pay

## 2021-01-05 DIAGNOSIS — M86671 Other chronic osteomyelitis, right ankle and foot: Secondary | ICD-10-CM

## 2021-01-05 DIAGNOSIS — L97514 Non-pressure chronic ulcer of other part of right foot with necrosis of bone: Secondary | ICD-10-CM | POA: Diagnosis not present

## 2021-01-05 DIAGNOSIS — E11621 Type 2 diabetes mellitus with foot ulcer: Secondary | ICD-10-CM

## 2021-01-05 DIAGNOSIS — D72829 Elevated white blood cell count, unspecified: Secondary | ICD-10-CM | POA: Diagnosis not present

## 2021-01-05 LAB — AEROBIC/ANAEROBIC CULTURE W GRAM STAIN (SURGICAL/DEEP WOUND)

## 2021-01-05 LAB — GLUCOSE, CAPILLARY
Glucose-Capillary: 230 mg/dL — ABNORMAL HIGH (ref 70–99)
Glucose-Capillary: 111 mg/dL — ABNORMAL HIGH (ref 70–99)

## 2021-01-05 NOTE — Progress Notes (Signed)
[name], [name] ([id number]) Visit Report for 01/02/2021 SuperBill Details Patient Name: Date of Service: [name], [name]. 01/02/2021 Medical Record Number: [id number] Patient Account Number: [id number] Date of Birth/Sex: Treating RN: 1971-02-24 (50 y.o. [name] Primary Care Provider: [name] Other Clinician: [name] Referring Provider: Treating Provider/Extender: [name], [name] in Treatment: 17 Diagnosis Coding ICD-10 Codes Code Description [phone] Other chronic osteomyelitis, right ankle and foot E11.621 Type 2 diabetes mellitus with foot ulcer L97.514 Non-pressure chronic ulcer of other part of right foot with necrosis of bone T81.31XS Disruption of external operation (surgical) wound, not elsewhere classified, sequela F17.218 Nicotine dependence, cigarettes, with other nicotine-induced disorders L97.522 Non-pressure chronic ulcer of other part of left foot with fat layer exposed Facility Procedures CPT4 Code Description Modifier Quantity [id number]-(Facility Use Only) HBOT full body chamber, 19min , 4 ICD-10 Diagnosis Description M86.671 Other chronic osteomyelitis, right ankle and foot E11.621 Type 2 diabetes mellitus with foot ulcer L97.514 Non-pressure chronic ulcer of other part of right foot with necrosis of bone T81.31XS Disruption of external operation (surgical) wound, not elsewhere classified, sequela Physician Procedures Quantity CPT4 Code Description Modifier [id number] - WC PHYS HYPERBARIC OXYGEN THERAPY 1 ICD-10 Diagnosis Description M86.671 Other chronic osteomyelitis, right ankle and foot E11.621 Type 2 diabetes mellitus with foot ulcer L97.514 Non-pressure chronic ulcer of other part of right foot with necrosis of bone T81.31XS Disruption of external operation (surgical) wound, not elsewhere classified, sequela Electronic Signature(s) Signed: 01/02/2021 1:40:25 PM By: [name] EMT Signed:  01/05/2021 9:30:42 AM By: Kalman Shan DO Entered By: [name] on 01/02/2021 13:40:24

## 2021-01-05 NOTE — Progress Notes (Addendum)
CORNEY, KNIGHTON ([id number]) Visit Report for 01/05/2021 Arrival Information Details Patient Name: Date of Service: [place] RD, [name] 01/05/2021 8:00 A M Medical Record Number: [id number] Patient Account Number: [id number] Date of Birth/Sex: Treating RN: June 24, 1970 (50 y.o. [name] Primary Care Renarda Mullinix: [name] Other Clinician: [name] Referring Sarea Fyfe: Treating Isabelly Kobler/Extender: [name], [name] in Treatment: 17 Visit Information History Since Last Visit All ordered tests and consults were completed: Yes Patient Arrived: Cane Added or deleted any medications: No Arrival Time: 08:30 Any new allergies or adverse reactions: No Accompanied By: self Had a fall or experienced change in No Transfer Assistance: None activities of daily living that may affect Patient Identification Verified: Yes risk of falls: Secondary Verification Process Completed: Yes Signs or symptoms of abuse/neglect since last visito No Patient Requires Transmission-Based Precautions: No Hospitalized since last visit: No Patient Has Alerts: No Implantable device outside of the clinic excluding No cellular tissue based products placed in the center since last visit: Pain Present Now: No Electronic Signature(s) Signed: 01/05/2021 11:27:25 AM By: [name] EMT Entered By: [name] on 01/05/2021 11:27:25 -------------------------------------------------------------------------------- Encounter Discharge Information Details Patient Name: Date of Service: [name], [place]. 01/05/2021 8:00 A M Medical Record Number: [id number] Patient Account Number: [id number] Date of Birth/Sex: Treating RN: September 22, 1970 (50 y.o. [name] Primary Care Yulisa Chirico: [name] Other Clinician: [name] Referring Elody Kleinsasser: Treating Jafeth Mustin/Extender: [name], [name] in Treatment: 17 Encounter Discharge Information  Items Discharge Condition: Stable Ambulatory Status: Cane Discharge Destination: Home Transportation: Private Auto Accompanied By: self Schedule Follow-up Appointment: No Clinical Summary of Care: Electronic Signature(s) Signed: 01/05/2021 1:50:19 PM By: [name] EMT Entered By: [name] on 01/05/2021 13:50:19 -------------------------------------------------------------------------------- Vitals Details Patient Name: Date of Service: [name], [place]. 01/05/2021 8:00 A M Medical Record Number: [id number] Patient Account Number: [id number] Date of Birth/Sex: Treating RN: [birth year] (50 y.o. [name] Primary Care Peggi Yono: [name] Other Clinician: [name] Referring Alayasia Breeding: Treating [place] Rico/Extender: [name], [name] in Treatment: 17 Vital Signs Time Taken: 10:54 Temperature (F): 97.6 Height (in): 64 Pulse (bpm): 102 Weight (lbs): 188 Respiratory Rate (breaths/min): 16 Body Mass Index (BMI): 32.3 Blood Pressure (mmHg): 134/86 Capillary Blood Glucose (mg/dl): 230 Reference Range: 80 - 120 mg / dl Electronic Signature(s) Signed: 01/05/2021 11:29:21 AM By: [name] EMT Entered By: [name] on 01/05/2021 11:29:21

## 2021-01-05 NOTE — Progress Notes (Addendum)
Joseph, [name] ([id number]) Visit Report for 01/05/2021 HBO Details Patient Name: Date of Service: [address], [name] 01/05/2021 8:00 A M Medical Record Number: [id number] Patient Account Number: [id number] Date of Birth/Sex: Treating RN: 1970/11/29 (50 y.o. [name] Primary Care [name]: [name] Other Clinician: Donavan [name] Referring [name]: Treating [name]/Extender: [name], [name] in Treatment: 17 HBO Treatment Course Details Treatment Course Number: 1 Ordering [name]: [name] Treatments Ordered: otal 40 HBO Treatment Start Date: 12/08/2020 HBO Indication: Diabetic Ulcer(s) of the Lower Extremity HBO Treatment Details Treatment Number: 8 Patient Type: Outpatient Chamber Type: Monoplace Chamber Serial #: [id number] Treatment Protocol: 2.5 ATA with 90 minutes oxygen, with two 5 minute air breaks Treatment Details Compression Rate Down: 2.0 psi / minute De-Compression Rate Up: 2.0 psi / minute A breaks and breathing ir Compress Tx Pressure periods Decompress Decompress Begins Reached (leave unused spaces Begins Ends blank) Chamber Pressure (ATA 1 2.5 2.5 2.5 2.5 2.5 - - 2.5 1 ) Clock Time (24 hr) 08:49 09:00 09:30 09:35 10:05 10:10 - - 10:40 10:51 Treatment Length: 122 (minutes) Treatment Segments: 4 Vital Signs Capillary Blood Glucose Reference Range: 80 - 120 mg / dl HBO Diabetic Blood Glucose Intervention Range: <131 mg/dl or >249 mg/dl Type: Time Vitals Blood Pulse: Respiratory Temperature: Capillary Blood Glucose Pulse Action Taken: Pressure: Rate: Glucose (mg/dl): Meter #: Oximetry (%) Taken: Pre 10:54 134/86 102 16 97.6 230 Post 10:54 103/77 93 16 97.7 111 discharge per protocol >101 mg/dL Treatment Response Treatment Toleration: Well Treatment Completion Status: Treatment Completed without Adverse Event Additional Procedure Documentation Tissue Sevierity: Limited to breakdown of skin Physician HBO  Attestation: I certify that I supervised this HBO treatment in accordance with Medicare guidelines. A trained emergency response team is readily available per Yes hospital policies and procedures. Continue HBOT as ordered. Yes Electronic Signature(s) Signed: 01/06/2021 12:41:04 PM By: Joseph Shan DO Previous Signature: 01/05/2021 1:49:18 PM Version By: [name] EMT Previous Signature: 01/05/2021 1:48:39 PM Version By: [name] EMT Entered By: [name] on 01/06/2021 12:37:27 -------------------------------------------------------------------------------- HBO Safety Checklist Details Patient Name: Date of Service: Joseph Hahn RD, [name] 01/05/2021 8:00 A M Medical Record Number: [id number] Patient Account Number: [id number] Date of Birth/Sex: Treating RN: Jul 30, 1970 (50 y.o. [name] Primary Care [name]: [name] Other Clinician: Donavan [name] Referring Danyale Ridinger: Treating [name]/Extender: [name], [name] in Treatment: 17 HBO Safety Checklist Items Safety Checklist Consent Form Signed Patient voided / foley secured and emptied When did you last eato Breakfast Last dose of injectable or oral agent Ostomy pouch emptied and vented if applicable NA All implantable devices assessed, documented and approved NA Intravenous access site secured and place NA Valuables secured Linens and cotton and cotton/polyester blend (less than 51% polyester) Personal oil-based products / skin lotions / body lotions removed Wigs or hairpieces removed NA Smoking or tobacco materials removed NA Books / newspapers / magazines / loose paper removed Cologne, aftershave, perfume and deodorant removed Jewelry removed (may wrap wedding band) Make-up removed NA Hair care products removed Battery operated devices (external) removed Heating patches and chemical warmers removed Titanium eyewear removed NA Nail polish cured greater  than 10 hours NA Casting material cured greater than 10 hours NA Hearing aids removed NA Loose dentures or partials removed NA Prosthetics have been removed NA Patient demonstrates correct use of air break device (if applicable) Patient concerns have been addressed Patient grounding bracelet on and cord attached to chamber Specifics  for Inpatients (complete in addition to above) Medication sheet sent with patient NA Intravenous medications needed or due during therapy sent with patient NA Drainage tubes (e.g. nasogastric tube or chest tube secured and vented) NA Endotracheal or Tracheotomy tube secured NA Cuff deflated of air and inflated with saline NA Airway suctioned NA Notes Paper version used prior to treatment start. Electronic Signature(s) Signed: 01/05/2021 1:46:57 PM By: [name] EMT Previous Signature: 01/05/2021 11:31:08 AM Version By: [name] EMT Entered By: [name] on 01/05/2021 13:46:57

## 2021-01-06 ENCOUNTER — Encounter (HOSPITAL_BASED_OUTPATIENT_CLINIC_OR_DEPARTMENT_OTHER): Payer: 59 | Admitting: Internal Medicine

## 2021-01-06 DIAGNOSIS — M86671 Other chronic osteomyelitis, right ankle and foot: Secondary | ICD-10-CM | POA: Diagnosis not present

## 2021-01-06 DIAGNOSIS — E11621 Type 2 diabetes mellitus with foot ulcer: Secondary | ICD-10-CM | POA: Diagnosis not present

## 2021-01-06 DIAGNOSIS — L97514 Non-pressure chronic ulcer of other part of right foot with necrosis of bone: Secondary | ICD-10-CM | POA: Diagnosis not present

## 2021-01-06 DIAGNOSIS — D72829 Elevated white blood cell count, unspecified: Secondary | ICD-10-CM | POA: Diagnosis not present

## 2021-01-06 LAB — GLUCOSE, CAPILLARY
Glucose-Capillary: 130 mg/dL — ABNORMAL HIGH (ref 70–99)
Glucose-Capillary: 159 mg/dL — ABNORMAL HIGH (ref 70–99)
Glucose-Capillary: 163 mg/dL — ABNORMAL HIGH (ref 70–99)

## 2021-01-06 NOTE — Progress Notes (Signed)
KINGSLY, KLOEPFER ([id number]) Visit Report for 01/06/2021 SuperBill Details Patient Name: Date of Service: [place] RD, [name] 01/06/2021 Medical Record Number: [id number] Patient Account Number: [id number] Date of Birth/Sex: Treating RN: Jul 27, 1970 (50 y.o. [name], [name] Primary Care Provider: [name] Other Clinician: [name] Referring Provider: Treating Provider/Extender: [name], [name] in Treatment: 17 Diagnosis Coding ICD-10 Codes Code Description [phone] Other chronic osteomyelitis, right ankle and foot E11.621 Type 2 diabetes mellitus with foot ulcer L97.514 Non-pressure chronic ulcer of other part of right foot with necrosis of bone T81.31XS Disruption of external operation (surgical) wound, not elsewhere classified, sequela F17.218 Nicotine dependence, cigarettes, with other nicotine-induced disorders L97.522 Non-pressure chronic ulcer of other part of left foot with fat layer exposed Facility Procedures CPT4 Code Description Modifier Quantity [id number] G0277-(Facility Use Only) HBOT full body chamber, 5min , 4 ICD-10 Diagnosis Description M86.671 Other chronic osteomyelitis, right ankle and foot E11.621 Type 2 diabetes mellitus with foot ulcer L97.514 Non-pressure chronic ulcer of other part of right foot with necrosis of bone T81.31XS Disruption of external operation (surgical) wound, not elsewhere classified, sequela Physician Procedures Quantity CPT4 Code Description Modifier [id number] - WC PHYS HYPERBARIC OXYGEN THERAPY 1 ICD-10 Diagnosis Description M86.671 Other chronic osteomyelitis, right ankle and foot E11.621 Type 2 diabetes mellitus with foot ulcer L97.514 Non-pressure chronic ulcer of other part of right foot with necrosis of bone T81.31XS Disruption of external operation (surgical) wound, not elsewhere classified, sequela Electronic Signature(s) Signed: 01/06/2021 1:37:06 PM By: [name] EMT Signed:  01/06/2021 5:01:54 PM By: Kalman Shan DO Entered By: [name] on 01/06/2021 13:37:05

## 2021-01-06 NOTE — Progress Notes (Addendum)
CORDELLE, DAHMEN ([id number]) Visit Report for 01/02/2021 Arrival Information Details Patient Name: Date of Service: [place] RD, [place] 01/02/2021 9:00 A M Medical Record Number: [id number] Patient Account Number: [id number] Date of Birth/Sex: Treating RN: Apr 13, 1970 (50 y.o. [name], [name] Primary Care Evy Lutterman: [name] Other Clinician: [name] Referring Nalanie Winiecki: Treating Leyton Brownlee/Extender: [name], [name] in Treatment: 17 Visit Information History Since Last Visit All ordered tests and consults were completed: Yes Patient Arrived: Cane Added or deleted any medications: No Arrival Time: 08:25 Any new allergies or adverse reactions: No Accompanied By: self Had a fall or experienced change in No Transfer Assistance: None activities of daily living that may affect Patient Identification Verified: Yes risk of falls: Secondary Verification Process Completed: Yes Signs or symptoms of abuse/neglect since last visito No Patient Requires Transmission-Based Precautions: No Hospitalized since last visit: No Patient Has Alerts: No Implantable device outside of the clinic excluding No cellular tissue based products placed in the center since last visit: Pain Present Now: No Electronic Signature(s) Signed: 01/02/2021 9:51:50 AM By: [name] EMT Previous Signature: 01/02/2021 9:50:17 AM Version By: [name] EMT Entered By: [name] on 01/02/2021 09:51:49 -------------------------------------------------------------------------------- Encounter Discharge Information Details Patient Name: Date of Service: [name], [place]. 01/02/2021 9:00 A M Medical Record Number: [id number] Patient Account Number: [id number] Date of Birth/Sex: Treating RN: 05-05-1970 (50 y.o. [name] Primary Care Rommel Hogston: [name] Other Clinician: [name] Referring Bilan Tedesco: Treating Chantay Whitelock/Extender: [name] Weeks in Treatment: 17 Encounter Discharge Information Items Discharge Condition: Stable Ambulatory Status: Cane Discharge Destination: Home Transportation: Private Auto Accompanied By: self Schedule Follow-up Appointment: No Clinical Summary of Care: Electronic Signature(s) Signed: 01/02/2021 1:41:07 PM By: [name] EMT Entered By: [name] on 01/02/2021 13:41:07 -------------------------------------------------------------------------------- [place] Details Patient Name: Date of Service: [name], [place] 01/02/2021 9:00 A M Medical Record Number: [id number] Patient Account Number: [id number] Date of Birth/Sex: Treating RN: 1970-05-17 (50 y.o. [name] Primary Care Ajai Harville: [name] Other Clinician: Referring Adaleena Mooers: Treating Adela Esteban/Extender: [name], [name] in Treatment: 17 Vital Signs Time Taken: 08:26 Temperature (F): 98.4 Height (in): 64 Pulse (bpm): 108 Weight (lbs): 188 Respiratory Rate (breaths/min): 18 Body Mass Index (BMI): 32.3 Blood Pressure (mmHg): 139/85 Capillary Blood Glucose (mg/dl): 165 Reference Range: 80 - 120 mg / dl Electronic Signature(s) Signed: 01/02/2021 9:51:24 AM By: [name] EMT Entered By: [name] on 01/02/2021 09:51:24

## 2021-01-06 NOTE — Progress Notes (Signed)
KIAH, KEAY ([id number]) Visit Report for 01/05/2021 SuperBill Details Patient Name: Date of Service: [place] [address], [place] 01/05/2021 Medical Record Number: [id number] Patient Account Number: [id number] Date of Birth/Sex: Treating RN: [birth year] (50 y.o. [name] Primary Care Provider: [name] Other Clinician: [name] Referring Provider: Treating Provider/Extender: [name], [name] in Treatment: 17 Diagnosis Coding ICD-10 Codes Code Description [phone] Other chronic osteomyelitis, right ankle and foot E11.621 Type 2 diabetes mellitus with foot ulcer L97.514 Non-pressure chronic ulcer of other part of right foot with necrosis of bone T81.31XS Disruption of external operation (surgical) wound, not elsewhere classified, sequela F17.218 Nicotine dependence, cigarettes, with other nicotine-induced disorders L97.522 Non-pressure chronic ulcer of other part of left foot with fat layer exposed Facility Procedures CPT4 Code Description Modifier Quantity [id number] G0277-(Facility Use Only) HBOT full body chamber, 71min , 4 ICD-10 Diagnosis Description M86.671 Other chronic osteomyelitis, right ankle and foot E11.621 Type 2 diabetes mellitus with foot ulcer L97.514 Non-pressure chronic ulcer of other part of right foot with necrosis of bone T81.31XS Disruption of external operation (surgical) wound, not elsewhere classified, sequela Physician Procedures Quantity CPT4 Code Description Modifier [id number] 72820 - WC PHYS HYPERBARIC OXYGEN THERAPY 1 ICD-10 Diagnosis Description M86.671 Other chronic osteomyelitis, right ankle and foot E11.621 Type 2 diabetes mellitus with foot ulcer L97.514 Non-pressure chronic ulcer of other part of right foot with necrosis of bone T81.31XS Disruption of external operation (surgical) wound, not elsewhere classified, sequela Electronic Signature(s) Signed: 01/05/2021 1:49:41 PM By: [name]  EMT Signed: 01/06/2021 12:41:04 PM By: Kalman Shan DO Entered By: [name] on 01/05/2021 13:49:40

## 2021-01-06 NOTE — Progress Notes (Signed)
Patient ID: ZAIM NITTA, male    DOB: 01-27-1971  MRN: [id number]  CC: Rash   Subjective: [name] is a 50 y.o. male who presents for rash.   His concerns today include:  RASH: Present for at least 5 months on bilateral arms, chest, and head. Worsening and itching. Has not used any over-the-counter medications.   2. DIABETES TYPE 2 FOLLOW-UP: Appointment 11/25/2020 at [facility] and [facility] with clinical pharmacist: A/P: Diabetes longstanding currently uncontrolled. Patient is able to verbalize appropriate hypoglycemia management plan. Medication adherence appears appropriate. He is tolerating Trulicity well. Amenable to increase to 1.5 mg dose.  -Increase Trulicity 1.5 mg weekly.  -Continued Lantus 15 units BID. -Extensively discussed pathophysiology of diabetes, recommended lifestyle interventions, dietary effects on blood sugar control -Counseled on s/sx of and management of hypoglycemia -Next A1C anticipated 11/2020.   01/07/2021: Doing well on current regimen. Home blood sugars 150's. No issues and/or concerns.  Patient Active Problem List   Diagnosis Date Noted   Ulcer of right foot with necrosis of bone ([place]) 07/22/2020   Normocytic anemia 07/05/2020   Leukocytosis 07/05/2020   Wound infection 03/25/2020   Gas gangrene of foot ([place])    Type 2 diabetes mellitus with diabetic polyneuropathy, with long-term current use of insulin ([place]) 07/16/2019   Type 2 diabetes mellitus with stage 3a chronic kidney disease, with long-term current use of insulin ([place]) 07/16/2019   Diabetic foot ulcer ([place]) 06/29/2019   CRI (chronic renal insufficiency), stage 3 (moderate) ([place]) 01/24/2019   Obesity 01/24/2019   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[place]) 11/13/2018   Diabetic foot ([place]) 10/17/2018   Non-pressure chronic ulcer of other part of right foot limited to breakdown of skin ([place]) 09/11/2018   Type 2 diabetes mellitus with proliferative retinopathy of both eyes,  without long-term current use of insulin ([place]) 07/14/2018   Diabetic infection of left foot ([place]) 07/14/2018   Wound cellulitis 05/22/2018   Osteomyelitis of foot, acute ([place]) 05/21/2018   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 ([place])    Diabetic ulcer of left midfoot associated with diabetes mellitus due to underlying condition, with fat layer exposed ([place]) 08/26/2017   B12 deficiency 06/15/2017   Vitamin D deficiency 06/03/2017   Iron deficiency anemia 06/01/2017   Diabetic foot infection ([place])    Osteomyelitis ([place]) 05/25/2017   Vitreous floaters of right eye 09/15/2016   Non compliance w medication regimen 09/15/2016   Depression 09/01/2016   Foot ulcer due to secondary DM ([place]) 08/20/2016   Hidradenitis suppurativa 10/13/2015   Peripheral polyneuropathy 07/07/2015   Coronary atherosclerosis of native coronary artery 08/17/2013   Diabetic retinopathy ([place]) 02/02/2013   Diabetic neuropathy ([place]) 12/01/2012   Hypertension associated with diabetes ([place]) 08/03/2012   Tobacco use disorder 08/03/2012   Erectile dysfunction 08/03/2012   Diabetes mellitus with neurological manifestations, uncontrolled 05/26/2006     Current Outpatient Medications on File Prior to Visit  Medication Sig Dispense Refill   Accu-Chek FastClix Lancets MISC 1 Package by Does not apply route as directed. Use as instructed to test blood sugar 2 times daily E11.65 100 each 12   amLODipine (NORVASC) 10 MG tablet Take 1 tablet (10 mg total) by mouth daily. 90 tablet 3   aspirin EC 81 MG tablet Take 1 tablet (81 mg total) by mouth daily. 90 tablet 1   b complex vitamins tablet Take 1 tablet by mouth daily. 100 tablet 3   blood glucose meter kit and  supplies KIT Dispense based on patient and insurance preference. Use up to four times daily as directed. (FOR ICD-9 250.00, 250.01). 1 each 0   Blood Pressure Monitor DEVI Please provide patient with insurance approved blood pressure monitor. I10.0 1  each 0   carvedilol (COREG) 25 MG tablet Take 1 tablet (25 mg total) by mouth 2 (two) times daily. 180 tablet 1   Cholecalciferol (VITAMIN D3) 50 MCG (2000 UT) capsule Take 1 capsule (2,000 Units total) by mouth daily. 100 capsule 2   Continuous Blood Gluc Receiver (FREESTYLE LIBRE 2 READER) DEVI Monitor blood glucose levels 4-5 times per day. ICD10 E11.42 Z79.4 1 each 6   Continuous Blood Gluc Sensor (FREESTYLE LIBRE 2 SENSOR) MISC Monitor blood glucose levels 4-5 times per day. ICD10 E11.42 Z79.4 1 each 6   ferrous sulfate 325 (65 FE) MG tablet Take 1 tablet (325 mg total) by mouth daily. 90 tablet 1   gabapentin (NEURONTIN) 300 MG capsule Take 1 capsule (300 mg total) by mouth 3 (three) times daily. Patient needs office visit before refills will be given 270 capsule 3   glucose blood test strip Use as instructed to test blood sugar 2 times daily E11.65 100 each 12   HYDROcodone-acetaminophen (NORCO/VICODIN) 5-325 MG tablet Take 1 tablet by mouth every 4 (four) hours as needed for moderate pain. 20 tablet 0   insulin lispro (HUMALOG KWIKPEN) 100 UNIT/ML KwikPen For blood sugars 0-150 give 0 units of insulin, 151-200 give 2 units of insulin, 201-250 give 4 units, 251-300 give 6 units, 301-350 give 8 units, 351-400 give 10 units,> 400 give 12 units and call M.D. Discussed hypoglycemia protocol. 15 mL 5   lisinopril (ZESTRIL) 2.5 MG tablet Take 1 tablet (2.5 mg total) by mouth daily. 90 tablet 3   Misc. Devices MISC Please provide patient with insurance approved diabetic shoes. E11.65 1 each 0   polyethylene glycol (MIRALAX / GLYCOLAX) 17 g packet Take 17 g by mouth daily as needed.     senna (SENOKOT) 8.6 MG tablet Take 1 tablet (8.6 mg total) by mouth daily as needed for constipation. 90 tablet 0   tizanidine (ZANAFLEX) 2 MG capsule Take 1 capsule (2 mg total) by mouth 3 (three) times daily. 21 capsule 0   atorvastatin (LIPITOR) 20 MG tablet Take 1 tablet (20 mg total) by mouth daily. 90 tablet 1    cloNIDine (CATAPRES) 0.1 MG tablet Take 1 tablet (0.1 mg total) by mouth 2 (two) times daily. 180 tablet 1   [DISCONTINUED] cetirizine (ZYRTEC) 10 MG tablet Take 1 tablet (10 mg total) by mouth daily. Prn itching (Patient not taking: Reported on 03/25/2020) 30 tablet 11   No current facility-administered medications on file prior to visit.    Allergies  Allergen Reactions   Bee Venom Swelling    SWELLING REACTION UNSPECIFIED    Penicillins Hives and Other (See Comments)    Full body hives as a child with no shortness of breath or swelling  **Can tolerate cephalosporins**   Clonidine Derivatives     Pt states "It is making me dizzy"    Social History   Socioeconomic History   Marital status: Legally Separated    Spouse name: Not on file   Number of children: Not on file   Years of education: Not on file   Highest education level: Not on file  Occupational History   Occupation: Food service    Employer: [employer]  Tobacco Use   Smoking  status: Every Day    Packs/day: 0.50    Years: 25.00    Pack years: 12.50    Types: Cigarettes   Smokeless tobacco: Never  Vaping Use   Vaping Use: Never used  Substance and Sexual Activity   Alcohol use: Not Currently   Drug use: No   Sexual activity: Not Currently  Other Topics Concern   Not on file  Social History Narrative   No longer works in [employer]. On disability. Recently moved in early 2018 from [place]. Previous saw free clinic providers.       Living with and helping take are of his elderly father.   Social Determinants of Health   Financial Resource Strain: Not on file  Food Insecurity: Not on file  Transportation Needs: Not on file  Physical Activity: Not on file  Stress: Not on file  Social Connections: Not on file  Intimate Partner Violence: Not on file    Family History  Problem Relation Age of Onset   Cancer Mother    Lung cancer Mother 30       smoker   Diabetes Father    Hypertension  Father    Hyperlipidemia Other    Colon cancer Neg Hx    Colon polyps Neg Hx    Esophageal cancer Neg Hx    Rectal cancer Neg Hx    Stomach cancer Neg Hx     Past Surgical History:  Procedure Laterality Date   AMPUTATION Left 08/22/2016   Procedure: GREAT TOE AMPUTATION;  Surgeon: [name], MD;  Location: [facility];  Service: Orthopedics;  Laterality: Left;   AMPUTATION Right 05/28/2017   Procedure: AMPUTATION 1st & 3rd TOE;  Surgeon: [name], MD;  Location: [facility];  Service: Orthopedics;  Laterality: Right;   EYE SURGERY  yrs ago   Both Eye Lasik    I & D EXTREMITY Right 03/25/2020   Procedure: IRRIGATION AND DEBRIDEMENT OF RIGHT FOOT. AMPUTATION OF FIFTH TOE AND PARTIAL OF FOURTH.;  Surgeon: [name], DPM;  Location: [facility];  Service: Podiatry;  Laterality: Right;   IRRIGATION AND DEBRIDEMENT FOOT Left 06/29/2019   Procedure: IRRIGATION AND DEBRIDEMENT FOOT application wound vac;  Surgeon: [name], DPM;  Location: [facility];  Service: Podiatry;  Laterality: Left;   IRRIGATION AND DEBRIDEMENT FOOT Right 05/16/2020   Procedure: IRRIGATION AND DEBRIDEMENT FOOT;  Surgeon: [name], DPM;  Location: [facility];  Service: Podiatry;  Laterality: Right;  Leave patient in bed   IRRIGATION AND DEBRIDEMENT FOOT Right 06/04/2020   Procedure: IRRIGATION AND DEBRIDEMENT FOOT, APPLICATION OF SKIN GRAFT SUBSTITUTE;  Surgeon: [name], DPM;  Location: [place];  Service: Podiatry;  Laterality: Right;   IRRIGATION AND DEBRIDEMENT FOOT Right 07/29/2020   Procedure: IRRIGATION AND DEBRIDEMENT FOOT; METATARSAL RESECTION AS INDICATED RIGHT FOOT;  Surgeon: [name], DPM;  Location: [facility];  Service: Podiatry;  Laterality: Right;   LEFT HEART CATHETERIZATION WITH CORONARY ANGIOGRAM N/A 07/31/2013   Procedure: LEFT HEART CATHETERIZATION WITH CORONARY ANGIOGRAM;  Surgeon: Jettie Booze, MD;  Location: [facility];  Service: Cardiovascular;   Laterality: N/A;   spinal tap  yrs ago   toe amputated     TRANSMETATARSAL AMPUTATION Right 03/27/2020   Procedure: TRANSMETATARSAL AMPUTATION RIGHT FOOT, MEDIAL PLANTAR ARTERY FLAP, APPLICATION OF WOUND VAC ;  Surgeon: [name], DPM;  Location: [facility];  Service: Podiatry;  Laterality: Right;  ROS: Review of Systems Negative except as stated above  PHYSICAL EXAM: BP (!) 145/91 (BP Location: Right Arm, Patient Position: Sitting, Cuff Size: Normal)   Pulse 88   Temp (!) 97.5 F (36.4 C) (Oral)   Resp 16   Wt 188 lb (85.3 kg)   SpO2 99%   BMI 27.76 kg/m   Physical Exam HENT:     Head: Normocephalic and atraumatic.  Eyes:     Extraocular Movements: Extraocular movements intact.     Conjunctiva/sclera: Conjunctivae normal.     Pupils: Pupils are equal, round, and reactive to light.  Cardiovascular:     Rate and Rhythm: Normal rate and regular rhythm.     Pulses: Normal pulses.     Heart sounds: Normal heart sounds.  Pulmonary:     Effort: Pulmonary effort is normal.     Breath sounds: Normal breath sounds.  Musculoskeletal:     Cervical back: Normal range of motion and neck supple.  Skin:    General: Skin is warm and dry.     Findings: Rash present.     Comments: Bilateral arms, chest, and head with hyperpigmented macular pruritic rash. No evidence of drainage. No evidence of compromise in skin integrity.   Neurological:     General: No focal deficit present.     Mental Status: He is alert and oriented to person, place, and time.  Psychiatric:        Mood and Affect: Mood normal.        Behavior: Behavior normal.    Results for orders placed or performed in visit on 01/07/21  POCT glycosylated hemoglobin (Hb A1C)  Result Value Ref Range   Hemoglobin A1C     HbA1c POC (<> result, manual entry)     HbA1c, POC (prediabetic range)     HbA1c, POC (controlled diabetic range) 7.3 (A) 0.0 - 7.0 %  Results for orders placed or performed in visit on 01/07/21   Glucose, capillary  Result Value Ref Range   Glucose-Capillary 151 (H) 70 - 99 mg/dL   Comment 1 PRE HBOT OK   Glucose, capillary  Result Value Ref Range   Glucose-Capillary 130 (H) 70 - 99 mg/dL   Comment 1 POST HBO OK     ASSESSMENT AND PLAN: 1. Rash and nonspecific skin eruption: - Begin Triamcinolone as prescribed.  - Referral to Dermatology for further evaluation and management.  - Follow-up with primary provider as scheduled.  - Ambulatory referral to Dermatology - triamcinolone cream (KENALOG) 0.1 %; Apply 1 application topically 2 (two) times daily.  Dispense: 80 g; Refill: 0  2. Type 2 diabetes mellitus with diabetic polyneuropathy, with long-term current use of insulin ([place]): - Hemoglobin A1c not at goal at 7.3%, goal < 7%. However, this is improved from previous of 10.8% on 09/23/2020.  - Continue Insulin Glargine as prescribed. - Increase Dulaglutide from 1.5 mg once weekly to 3 mg once weekly. - Discussed the importance of healthy eating habits, low-carbohydrate diet, low-sugar diet, regular aerobic exercise (at least 150 minutes a week as tolerated) and medication compliance to achieve or maintain control of diabetes. - Follow-up with primary provider in 4 weeks or sooner if needed.  - POCT glycosylated hemoglobin (Hb A1C) - insulin glargine (LANTUS SOLOSTAR) 100 UNIT/ML Solostar Pen; Inject 15 Units into the skin 2 (two) times daily.  Dispense: 27 mL; Refill: 0 - Dulaglutide 3 MG/0.5ML SOPN; Inject 3 mg into the skin once a week.  Dispense: 6 mL; Refill:  0 - Insulin Pen Needle 32G X 4 MM MISC; Use as instructed. Inject into the skin three time daily.  Dispense: 100 each; Refill: 0    Patient was given the opportunity to ask questions.  Patient verbalized understanding of the plan and was able to repeat key elements of the plan. Patient was given clear instructions to go to Emergency Department or return to medical center if symptoms don't improve, worsen, or new  problems develop.The patient verbalized understanding.   Orders Placed This Encounter  Procedures   Ambulatory referral to Dermatology   POCT glycosylated hemoglobin (Hb A1C)    Requested Prescriptions   Signed Prescriptions Disp Refills   insulin glargine (LANTUS SOLOSTAR) 100 UNIT/ML Solostar Pen 27 mL 0    Sig: Inject 15 Units into the skin 2 (two) times daily.   Dulaglutide 3 MG/0.5ML SOPN 6 mL 0    Sig: Inject 3 mg into the skin once a week.   Insulin Pen Needle 32G X 4 MM MISC 100 each 0    Sig: Use as instructed. Inject into the skin three time daily.   triamcinolone cream (KENALOG) 0.1 % 80 g 0    Sig: Apply 1 application topically 2 (two) times daily.    Return in about 4 weeks (around 02/04/2021) for Follow-Up or next available diabetes .  Camillia Herter, NP

## 2021-01-06 NOTE — Progress Notes (Addendum)
[name], [name] ([id number]) Visit Report for 01/06/2021 Arrival Information Details Patient Name: Date of Service: [place] RD, [name] 01/06/2021 8:00 A M Medical Record Number: [id number] Patient Account Number: [id number] Date of Birth/Sex: Treating RN: 07-Feb-1971 (50 y.o. [name], Meta.Reding Primary Care [name]: [name] Other Clinician: Donavan [name] Referring [name]: Treating [name]/Extender: [name], [name] in Treatment: 29 Visit Information History Since Last Visit All ordered tests and consults were completed: Yes Patient Arrived: Cane Added or deleted any medications: No Arrival Time: 08:19 Any new allergies or adverse reactions: No Accompanied By: self Had a fall or experienced change in No Transfer Assistance: None activities of daily living that may affect Patient Identification Verified: Yes risk of falls: Secondary Verification Process Completed: Yes Signs or symptoms of abuse/neglect since last visito No Patient Requires Transmission-Based Precautions: No Hospitalized since last visit: No Patient Has Alerts: No Implantable device outside of the clinic excluding No cellular tissue based products placed in the center since last visit: Pain Present Now: No Electronic Signature(s) Signed: 01/06/2021 9:47:16 AM By: [name] EMT Entered By: [name] on 01/06/2021 09:47:16 -------------------------------------------------------------------------------- Encounter Discharge Information Details Patient Name: Date of Service: [name] RD, [name]. 01/06/2021 8:00 A M Medical Record Number: [id number] Patient Account Number: [id number] Date of Birth/Sex: Treating RN: September 09, 1970 (50 y.o. [name] Primary Care [name]: [name] Other Clinician: Donavan [name] Referring [name]: Treating [name]/Extender: [name], [name] in Treatment: 17 Encounter Discharge Information  Items Discharge Condition: Stable Ambulatory Status: Cane Discharge Destination: Home Transportation: Private Auto Accompanied By: self Schedule Follow-up Appointment: No Clinical Summary of Care: Electronic Signature(s) Signed: 01/06/2021 1:37:41 PM By: [name] EMT Entered By: [name] on 01/06/2021 13:37:41 -------------------------------------------------------------------------------- [place] Details Patient Name: Date of Service: [name] RD, [name]. 01/06/2021 8:00 A M Medical Record Number: [id number] Patient Account Number: [id number] Date of Birth/Sex: Treating RN: 01-Oct-1970 (50 y.o. [name], [name] Primary Care [name]: [name] Other Clinician: Donavan [name] Referring [name]: Treating [name]/Extender: [name], [name] in Treatment: 17 Vital Signs Time Taken: 08:25 Temperature (F): 97.5 Weight (lbs): 188 Pulse (bpm): 94 Respiratory Rate (breaths/min): 18 Blood Pressure (mmHg): 130/85 Capillary Blood Glucose (mg/dl): 130 Reference Range: 80 - 120 mg / dl Electronic Signature(s) Signed: 01/06/2021 9:48:18 AM By: [name] EMT Entered By: [name] on 01/06/2021 09:48:18

## 2021-01-06 NOTE — Progress Notes (Addendum)
FISHER, HARGADON ([id number]) Visit Report for 01/06/2021 HBO Details Patient Name: Date of Service: [place] [address], [name] 01/06/2021 8:00 A M Medical Record Number: [id number] Patient Account Number: [id number] Date of Birth/Sex: Treating RN: [birth year] (50 y.o. [name], [name] Primary Care Franke Menter: [name] Other Clinician: [name] Referring Laketia Vicknair: Treating Gelene Recktenwald/Extender: [name], [name] in Treatment: 17 HBO Treatment Course Details Treatment Course Number: 1 Ordering Shamica Moree: [name] Treatments Ordered: otal 40 HBO Treatment Start Date: 12/08/2020 HBO Indication: Diabetic Ulcer(s) of the Lower Extremity HBO Treatment Details Treatment Number: 9 Patient Type: Outpatient Chamber Type: Monoplace Chamber Serial #: [id number] Treatment Protocol: 2.5 ATA with 90 minutes oxygen, with two 5 minute air breaks Treatment Details Compression Rate Down: 2.0 psi / minute De-Compression Rate Up: 2.0 psi / minute A breaks and breathing ir Compress Tx Pressure periods Decompress Decompress Begins Reached (leave unused spaces Begins Ends blank) Chamber Pressure (ATA 1 2.5 2.5 2.5 2.5 2.5 - - 2.5 1 ) Clock Time (24 hr) 08:45 08:56 09:26 09:31 10:01 10:06 - - 10:36 10:47 Treatment Length: 122 (minutes) Treatment Segments: 4 Vital Signs Capillary Blood Glucose Reference Range: 80 - 120 mg / dl HBO Diabetic Blood Glucose Intervention Range: <131 mg/dl or >249 mg/dl Type: Time Vitals Blood Pulse: Respiratory Temperature: Capillary Blood Glucose Pulse Action Taken: Pressure: Rate: Glucose (mg/dl): Meter #: Oximetry (%) Taken: Pre 08:25 130/85 94 18 97.5 130 Waited to measure blood glucose again. Post 10:50 128/83 90 18 97.7 163 Pre 08:43 159 Cleared for HBO Treatment Treatment Response Treatment Toleration: Well Treatment Completion Status: Treatment Completed without Adverse Event Additional Procedure Documentation Tissue Sevierity:  Limited to breakdown of skin Physician HBO Attestation: I certify that I supervised this HBO treatment in accordance with Medicare guidelines. A trained emergency response team is readily available per Yes hospital policies and procedures. Continue HBOT as ordered. Yes Electronic Signature(s) Signed: 01/06/2021 5:01:54 PM By: Kalman Shan DO Previous Signature: 01/06/2021 1:36:12 PM Version By: [name] EMT Entered By: Kalman Shan on 01/06/2021 16:59:50 -------------------------------------------------------------------------------- HBO Safety Checklist Details Patient Name: Date of Service: [name], [name] 01/06/2021 8:00 A M Medical Record Number: [id number] Patient Account Number: [id number] Date of Birth/Sex: Treating RN: 1970/11/02 (50 y.o. [name], [name] Primary Care Indalecio Malmstrom: [name] Other Clinician: [name] Referring Chaze Hruska: Treating Zakeria Kulzer/Extender: [name], [name] in Treatment: 17 HBO Safety Checklist Items Safety Checklist Consent Form Signed Patient voided / foley secured and emptied When did you last eato Breakfast Last dose of injectable or oral agent Ostomy pouch emptied and vented if applicable NA All implantable devices assessed, documented and approved NA Intravenous access site secured and place NA Valuables secured Linens and cotton and cotton/polyester blend (less than 51% polyester) Personal oil-based products / skin lotions / body lotions removed Wigs or hairpieces removed NA Smoking or tobacco materials removed NA Books / newspapers / magazines / loose paper removed Cologne, aftershave, perfume and deodorant removed Jewelry removed (may wrap wedding band) Make-up removed NA Hair care products removed NA Battery operated devices (external) removed Heating patches and chemical warmers removed NA Titanium eyewear removed NA Nail polish cured greater than 10 hours Casting  material cured greater than 10 hours Hearing aids removed NA Loose dentures or partials removed NA Prosthetics have been removed NA Patient demonstrates correct use of air break device (if applicable) Patient concerns have been addressed Patient grounding bracelet on and cord attached to chamber Specifics for Inpatients (  complete in addition to above) Medication sheet sent with patient NA Intravenous medications needed or due during therapy sent with patient NA Drainage tubes (e.g. nasogastric tube or chest tube secured and vented) NA Endotracheal or Tracheotomy tube secured NA Cuff deflated of air and inflated with saline NA Airway suctioned NA Notes Paper version used prior to treatment. Electronic Signature(s) Signed: 01/06/2021 9:49:45 AM By: [name] EMT Entered By: [name] on 01/06/2021 09:49:45

## 2021-01-07 ENCOUNTER — Encounter (HOSPITAL_BASED_OUTPATIENT_CLINIC_OR_DEPARTMENT_OTHER): Payer: 59 | Admitting: Internal Medicine

## 2021-01-07 ENCOUNTER — Other Ambulatory Visit: Payer: Self-pay

## 2021-01-07 ENCOUNTER — Ambulatory Visit (INDEPENDENT_AMBULATORY_CARE_PROVIDER_SITE_OTHER): Payer: 59 | Admitting: Family

## 2021-01-07 ENCOUNTER — Encounter: Payer: Self-pay | Admitting: Family

## 2021-01-07 VITALS — BP 145/91 | HR 88 | Temp 97.5°F | Resp 16 | Wt 188.0 lb

## 2021-01-07 DIAGNOSIS — Z794 Long term (current) use of insulin: Secondary | ICD-10-CM | POA: Diagnosis not present

## 2021-01-07 DIAGNOSIS — R21 Rash and other nonspecific skin eruption: Secondary | ICD-10-CM

## 2021-01-07 DIAGNOSIS — E1142 Type 2 diabetes mellitus with diabetic polyneuropathy: Secondary | ICD-10-CM

## 2021-01-07 DIAGNOSIS — D72829 Elevated white blood cell count, unspecified: Secondary | ICD-10-CM | POA: Diagnosis not present

## 2021-01-07 LAB — POCT GLYCOSYLATED HEMOGLOBIN (HGB A1C): HbA1c, POC (controlled diabetic range): 7.3 % — AB (ref 0.0–7.0)

## 2021-01-07 LAB — GLUCOSE, CAPILLARY
Glucose-Capillary: 151 mg/dL — ABNORMAL HIGH (ref 70–99)
Glucose-Capillary: 130 mg/dL — ABNORMAL HIGH (ref 70–99)

## 2021-01-07 MED ORDER — LANTUS SOLOSTAR 100 UNIT/ML ~~LOC~~ SOPN: 15.0000 [IU] | PEN_INJECTOR | Freq: Two times a day (BID) | SUBCUTANEOUS | 0 refills | Status: DC

## 2021-01-07 MED ORDER — TRIAMCINOLONE ACETONIDE 0.1 % EX CREA
1.0000 | TOPICAL_CREAM | Freq: Two times a day (BID) | CUTANEOUS | 0 refills | Status: DC
Start: 2021-01-07 — End: 2021-03-16

## 2021-01-07 MED ORDER — INSULIN PEN NEEDLE 32G X 4 MM MISC: 0 refills | Status: DC

## 2021-01-07 MED ORDER — DULAGLUTIDE 3 MG/0.5ML ~~LOC~~ SOAJ: 3.0000 mg | SUBCUTANEOUS | 0 refills | Status: DC

## 2021-01-07 NOTE — Progress Notes (Signed)
[name], [name] ([id number]) Visit Report for 01/07/2021 [place] Details Patient Name: Date of Service: [place] RD, [name] 01/07/2021 Medical Record Number: [id number] Patient Account Number: [id number] Date of Birth/Sex: Treating RN: 11/08/1970 (50 y.o. [name] Primary Care Provider: [name] Other Clinician: Referring Provider: Treating Provider/Extender: [name], [name] in Treatment: 17 Diagnosis Coding ICD-10 Codes Code Description [phone] Other chronic osteomyelitis, right ankle and foot E11.621 Type 2 diabetes mellitus with foot ulcer L97.514 Non-pressure chronic ulcer of other part of right foot with necrosis of bone T81.31XS Disruption of external operation (surgical) wound, not elsewhere classified, sequela F17.218 Nicotine dependence, cigarettes, with other nicotine-induced disorders L97.522 Non-pressure chronic ulcer of other part of left foot with fat layer exposed Facility Procedures CPT4 Code Description Modifier Quantity [id number] - WOUND CARE VISIT-LEV 3 EST PT 1 Electronic Signature(s) Signed: 01/07/2021 4:37:06 PM By: [name] MD Signed: 01/07/2021 4:40:08 PM By: Baruch Gouty RN, BSN Entered By: Baruch Gouty on 01/07/2021 10:58:51

## 2021-01-07 NOTE — Progress Notes (Signed)
7.Rash x 5 mos Head, arms and chest

## 2021-01-07 NOTE — Progress Notes (Addendum)
Joseph, [name] ([id number]) Visit Report for 01/07/2021 HBO Details Patient Name: Date of Service: [address], [name] 01/07/2021 8:00 A M Medical Record Number: [id number] Patient Account Number: [id number] Date of Birth/Sex: Treating RN: [birth year] (50 y.o. [name] Primary Care Radwan Cowley: [name] Other Clinician: [name] Referring Glenice Ciccone: Treating Marvens Hollars/Extender: [name], [name] in Treatment: 17 HBO Treatment Course Details Treatment Course Number: 1 Ordering Jacori Mulrooney: [name] Treatments Ordered: otal 40 HBO Treatment Start Date: 12/08/2020 HBO Indication: Diabetic Ulcer(s) of the Lower Extremity HBO Treatment Details Treatment Number: 10 Patient Type: Outpatient Chamber Type: Monoplace Chamber Serial #: [id number] Treatment Protocol: 2.5 ATA with 90 minutes oxygen, with two 5 minute air breaks Treatment Details Compression Rate Down: 2.0 psi / minute De-Compression Rate Up: 2.0 psi / minute A breaks and breathing ir Compress Tx Pressure periods Decompress Decompress Begins Reached (leave unused spaces Begins Ends blank) Chamber Pressure (ATA 1 2.5 2.5 2.5 2.5 2.5 - - 2.5 1 ) Clock Time (24 hr) 08:10 08:21 08:51 08:56 09:26 09:31 - - 10:01 10:12 Treatment Length: 122 (minutes) Treatment Segments: 4 Vital Signs Capillary Blood Glucose Reference Range: 80 - 120 mg / dl HBO Diabetic Blood Glucose Intervention Range: <131 mg/dl or >249 mg/dl Type: Time Vitals Blood Pulse: Respiratory Temperature: Capillary Blood Glucose Pulse Action Taken: Pressure: Rate: Glucose (mg/dl): Meter #: Oximetry (%) Taken: Pre 07:54 139/82 93 20 98.1 151 Post 10:13 127/86 89 18 98.2 130 discharge per protocol >101 mg/dL Treatment Response Treatment Toleration: Well Treatment Completion Status: Treatment Completed without Adverse Event Additional Procedure Documentation Tissue Sevierity: Limited to breakdown of skin Cintia Gleed  Notes No concerns with treatment given Physician HBO Attestation: I certify that I supervised this HBO treatment in accordance with Medicare guidelines. A trained emergency response team is readily available per Yes hospital policies and procedures. Continue HBOT as ordered. Yes Electronic Signature(s) Signed: 01/07/2021 4:37:06 PM By: [name] MD Previous Signature: 01/07/2021 11:15:35 AM Version By: [name] EMT Entered By: [name] on 01/07/2021 16:10:23 -------------------------------------------------------------------------------- HBO Safety Checklist Details Patient Name: Date of Service: [name] RD, [name] 01/07/2021 8:00 A M Medical Record Number: [id number] Patient Account Number: [id number] Date of Birth/Sex: Treating RN: 12/29/1970 (50 y.o. [name] Primary Care Adryan Shin: [name] Other Clinician: [name] Referring Cordie Beazley: Treating Sat[place] Parco/Extender: [name], [name] in Treatment: 17 HBO Safety Checklist Items Safety Checklist Consent Form Signed Patient voided / foley secured and emptied When did you last eato Breakfast Last dose of injectable or oral agent n/a Ostomy pouch emptied and vented if applicable NA All implantable devices assessed, documented and approved NA Intravenous access site secured and place NA Valuables secured Linens and cotton and cotton/polyester blend (less than 51% polyester) Personal oil-based products / skin lotions / body lotions removed Wigs or hairpieces removed NA Smoking or tobacco materials removed Books / newspapers / magazines / loose paper removed Cologne, aftershave, perfume and deodorant removed Jewelry removed (may wrap wedding band) Make-up removed NA Hair care products removed Battery operated devices (external) removed Heating patches and chemical warmers removed Titanium eyewear removed NA Nail polish cured greater than 10 hours NA Casting  material cured greater than 10 hours NA Hearing aids removed NA Loose dentures or partials removed NA Prosthetics have been removed NA Patient demonstrates correct use of air break device (if applicable) Patient concerns have been addressed Patient grounding bracelet on and cord attached to chamber Specifics for Inpatients (complete  in addition to above) Medication sheet sent with patient Intravenous medications needed or due during therapy sent with patient Drainage tubes (e.g. nasogastric tube or chest tube secured and vented) Endotracheal or Tracheotomy tube secured Cuff deflated of air and inflated with saline Airway suctioned Electronic Signature(s) Signed: 01/07/2021 8:49:55 AM By: [name] EMT Entered By: [name] on 01/07/2021 08:49:55

## 2021-01-07 NOTE — Patient Instructions (Signed)
Keeping you healthy  Get these tests  Blood pressure- Have your blood pressure checked once a year by your healthcare provider.  Normal blood pressure is 120/80.  Weight- Have your body mass index (BMI) calculated to screen for obesity.  BMI is a measure of body fat based on height and weight. You can also calculate your own BMI at www.nhlbisupport.com/bmi/.  Cholesterol- Have your cholesterol checked regularly starting at age 51, sooner may be necessary if you have diabetes, high blood pressure, if a family member developed heart diseases at an early age or if you smoke.   Chlamydia, HIV, and other sexual transmitted disease- Get screened each year until the age of 25 then within three months of each new sexual partner.  Diabetes- Have your blood sugar checked regularly if you have high blood pressure, high cholesterol, a family history of diabetes or if you are overweight.  Get these vaccines  Flu shot- Every fall.  Tetanus shot- Every 10 years.  Menactra- Single dose; prevents meningitis.  Take these steps  Don't smoke- If you do smoke, ask your healthcare provider about quitting. For tips on how to quit, go to www.smokefree.gov or call 1-800-QUIT-NOW.  Be physically active- Exercise 5 days a week for at least 30 minutes.  If you are not already physically active start slow and gradually work up to 30 minutes of moderate physical activity.  Examples of moderate activity include walking briskly, mowing the yard, dancing, swimming bicycling, etc.  Eat a healthy diet- Eat a variety of healthy foods such as fruits, vegetables, low fat milk, low fat cheese, yogurt, lean meats, poultry, fish, beans, tofu, etc.  For more information on healthy eating, go to www.thenutritionsource.org  Drink alcohol in moderation- Limit alcohol intake two drinks or less a day.  Never drink and drive.  Dentist- Brush and floss teeth twice daily; visit your dentis twice a year.  Depression-Your emotional  health is as important as your physical health.  If you're feeling down, losing interest in things you normally enjoy please talk with your healthcare provider.  Gun Safety- If you keep a gun in your home, keep it unloaded and with the safety lock on.  Bullets should be stored separately.  Helmet use- Always wear a helmet when riding a motorcycle, bicycle, rollerblading or skateboarding.  Safe sex- If you may be exposed to a sexually transmitted infection, use a condom  Seat belts- Seat bels can save your life; always wear one.  Smoke/Carbon Monoxide detectors- These detectors need to be installed on the appropriate level of your home.  Replace batteries at least once a year.  Skin Cancer- When out in the sun, cover up and use sunscreen SPF 15 or higher.  Violence- If anyone is threatening or hurting you, please tell your healthcare provider. 

## 2021-01-07 NOTE — Progress Notes (Signed)
[name], [name] ([id number]) Visit Report for 01/07/2021 SuperBill Details Patient Name: Date of Service: [address], [name] 01/07/2021 Medical Record Number: [id number] Patient Account Number: [id number] Date of Birth/Sex: Treating RN: [birth year] (50 y.o. [name] Primary Care Provider: [name] Other Clinician: [name] Referring Provider: Treating Provider/Extender: [name], [name] in Treatment: 17 Diagnosis Coding ICD-10 Codes Code Description [phone] Other chronic osteomyelitis, right ankle and foot E11.621 Type 2 diabetes mellitus with foot ulcer L97.514 Non-pressure chronic ulcer of other part of right foot with necrosis of bone T81.31XS Disruption of external operation (surgical) wound, not elsewhere classified, sequela F17.218 Nicotine dependence, cigarettes, with other nicotine-induced disorders L97.522 Non-pressure chronic ulcer of other part of left foot with fat layer exposed Facility Procedures CPT4 Code Description Modifier Quantity [id number]-(Facility Use Only) HBOT full body chamber, 52min , 4 ICD-10 Diagnosis Description M86.671 Other chronic osteomyelitis, right ankle and foot E11.621 Type 2 diabetes mellitus with foot ulcer L97.514 Non-pressure chronic ulcer of other part of right foot with necrosis of bone T81.31XS Disruption of external operation (surgical) wound, not elsewhere classified, sequela Physician Procedures Quantity CPT4 Code Description Modifier [id number] - WC PHYS HYPERBARIC OXYGEN THERAPY 1 ICD-10 Diagnosis Description M86.671 Other chronic osteomyelitis, right ankle and foot E11.621 Type 2 diabetes mellitus with foot ulcer L97.514 Non-pressure chronic ulcer of other part of right foot with necrosis of bone T81.31XS Disruption of external operation (surgical) wound, not elsewhere classified, sequela Electronic Signature(s) Signed: 01/07/2021 11:16:04 AM By: [name]  EMT Signed: 01/07/2021 4:37:06 PM By: [name] MD Entered By: [name] on 01/07/2021 11:16:02

## 2021-01-07 NOTE — Progress Notes (Signed)
[name], [name] ([id number]) Visit Report for 01/07/2021 Arrival Information Details Patient Name: Date of Service: [place] RD, [name] 01/07/2021 10:45 A M Medical Record Number: [id number] Patient Account Number: [id number] Date of Birth/Sex: Treating RN: 28-Mar-1971 (50 y.o. [name], [name] Primary Care Chara Marquard: Minette Brine, [place] Other Clinician: Referring Jameeka Marcy: Treating Marysa Wessner/Extender: [name], [name] in Treatment: 17 Visit Information History Since Last Visit Added or deleted any medications: No Patient Arrived: [name] Any new allergies or adverse reactions: No Arrival Time: 10:51 Had a fall or experienced change in No Accompanied By: self activities of daily living that may affect Transfer Assistance: None risk of falls: Patient Identification Verified: Yes Signs or symptoms of abuse/neglect since last visito No Secondary Verification Process Completed: Yes Hospitalized since last visit: No Patient Requires Transmission-Based Precautions: No Implantable device outside of the clinic excluding No Patient Has Alerts: No cellular tissue based products placed in the center since last visit: Has Dressing in Place as Prescribed: Yes Pain Present Now: No Electronic Signature(s) Signed: 01/07/2021 4:40:08 PM By: Baruch Gouty RN, BSN Entered By: Baruch Gouty on 01/07/2021 10:52:43 -------------------------------------------------------------------------------- Clinic Level of Care Assessment Details Patient Name: Date of Service: [facility] RD, [name] 01/07/2021 10:45 A M Medical Record Number: [id number] Patient Account Number: [id number] Date of Birth/Sex: Treating RN: Jun 01, 1970 (50 y.o. [name] Primary Care Terrel Manalo: [name] Other Clinician: Referring Namiko Pritts: Treating Ailany Koren/Extender: [name], [name] in Treatment: 17 Clinic Level of Care Assessment Items TOOL 4 Quantity Score []  - 0 Use when only  an EandM is performed on FOLLOW-UP visit ASSESSMENTS - Nursing Assessment / Reassessment X- 1 10 Reassessment of Co-morbidities (includes updates in patient status) X- 1 5 Reassessment of Adherence to Treatment Plan ASSESSMENTS - Wound and Skin A ssessment / Reassessment []  - 0 Simple Wound Assessment / Reassessment - one wound X- 3 5 Complex Wound Assessment / Reassessment - multiple wounds []  - 0 Dermatologic / Skin Assessment (not related to wound area) ASSESSMENTS - Focused Assessment []  - 0 Circumferential Edema Measurements - multi extremities []  - 0 Nutritional Assessment / Counseling / Intervention []  - 0 Lower Extremity Assessment (monofilament, tuning fork, pulses) []  - 0 Peripheral Arterial Disease Assessment (using hand held doppler) ASSESSMENTS - Ostomy and/or Continence Assessment and Care []  - 0 Incontinence Assessment and Management []  - 0 Ostomy Care Assessment and Management (repouching, etc.) PROCESS - Coordination of Care X - Simple Patient / Family Education for ongoing care 1 15 []  - 0 Complex (extensive) Patient / Family Education for ongoing care X- 1 10 Staff obtains Programmer, systems, Records, T Results / Process Orders est []  - 0 Staff telephones HHA, Nursing Homes / Clarify orders / etc []  - 0 Routine Transfer to another Facility (non-emergent condition) []  - 0 Routine Hospital Admission (non-emergent condition) []  - 0 New Admissions / Biomedical engineer / Ordering NPWT Apligraf, etc. , []  - 0 Emergency Hospital Admission (emergent condition) X- 1 10 Simple Discharge Coordination []  - 0 Complex (extensive) Discharge Coordination PROCESS - Special Needs []  - 0 Pediatric / Minor Patient Management []  - 0 Isolation Patient Management []  - 0 Hearing / Language / Visual special needs []  - 0 Assessment of Community assistance (transportation, D/C planning, etc.) []  - 0 Additional assistance / Altered mentation []  - 0 Support Surface(s)  Assessment (bed, cushion, seat, etc.) INTERVENTIONS - Wound Cleansing / Measurement []  - 0 Simple Wound Cleansing - one wound X- 2 5 Complex Wound Cleansing -  multiple wounds []  - 0 Wound Imaging (photographs - any number of wounds) []  - 0 Wound Tracing (instead of photographs) []  - 0 Simple Wound Measurement - one wound []  - 0 Complex Wound Measurement - multiple wounds INTERVENTIONS - Wound Dressings X - Small Wound Dressing one or multiple wounds 3 10 []  - 0 Medium Wound Dressing one or multiple wounds []  - 0 Large Wound Dressing one or multiple wounds []  - 0 Application of Medications - topical []  - 0 Application of Medications - injection INTERVENTIONS - Miscellaneous []  - 0 External ear exam []  - 0 Specimen Collection (cultures, biopsies, blood, body fluids, etc.) []  - 0 Specimen(s) / Culture(s) sent or taken to Lab for analysis []  - 0 Patient Transfer (multiple staff / Civil Service fast streamer / Similar devices) []  - 0 Simple Staple / Suture removal (25 or less) []  - 0 Complex Staple / Suture removal (26 or more) []  - 0 Hypo / Hyperglycemic Management (close monitor of Blood Glucose) []  - 0 Ankle / Brachial Index (ABI) - do not check if billed separately X- 1 5 Vital Signs Has the patient been seen at the hospital within the last three years: Yes Total Score: 110 Level Of Care: New/Established - Level 3 Electronic Signature(s) Signed: 01/07/2021 4:40:08 PM By: Baruch Gouty RN, BSN Entered By: Baruch Gouty on 01/07/2021 10:57:36 -------------------------------------------------------------------------------- Encounter Discharge Information Details Patient Name: Date of Service: [name], [name] 01/07/2021 10:45 A M Medical Record Number: [id number] Patient Account Number: [id number] Date of Birth/Sex: Treating RN: 10-Dec-1970 (49 y.o. [name] Primary Care Katharine Rochefort: [name] Other Clinician: Referring Jamariah Tony: Treating Jozy Mcphearson/Extender:  [name], [name] in Treatment: 17 Encounter Discharge Information Items Discharge Condition: Stable Ambulatory Status: Cane Discharge Destination: Home Transportation: Private Auto Accompanied By: self Schedule Follow-up Appointment: Yes Clinical Summary of Care: Patient Declined Electronic Signature(s) Signed: 01/07/2021 4:40:08 PM By: Baruch Gouty RN, BSN Entered By: Baruch Gouty on 01/07/2021 10:58:41 -------------------------------------------------------------------------------- Patient/Caregiver Education Details Patient Name: Date of Service: [name], [place] 10/12/2022andnbsp10:45 A M Medical Record Number: [id number] Patient Account Number: [id number] Date of Birth/Gender: Treating RN: 1970/08/18 (50 y.o. [name] Primary Care Physician: [name] Other Clinician: Referring Physician: Treating Physician/Extender: Bobbye [place] in Treatment: 17 Education Assessment Education Provided To: Patient Education Topics Provided Hyperbaric Oxygenation: Methods: Explain/Verbal Responses: Reinforcements needed, State content correctly Infection: Methods: Explain/Verbal Responses: Reinforcements needed, State content correctly Wound/Skin Impairment: Methods: Explain/Verbal Responses: Reinforcements needed, State content correctly Electronic Signature(s) Signed: 01/07/2021 4:40:08 PM By: Baruch Gouty RN, BSN Entered By: Baruch Gouty on 01/07/2021 10:58:28 -------------------------------------------------------------------------------- Wound Assessment Details Patient Name: Date of Service: [name], [name] 01/07/2021 10:45 A M Medical Record Number: [id number] Patient Account Number: [id number] Date of Birth/Sex: Treating RN: 09-06-1970 (50 y.o. [name] Primary Care Eamon Tantillo: [name] Other Clinician: Referring Arelie Kuzel: Treating Andraya Frigon/Extender: [name],  [name] in Treatment: 17 Wound Status Wound Number: 3 Primary Diabetic Wound/Ulcer of the Lower Extremity Etiology: Wound Location: Right, Proximal, Lateral Foot Wound Open Wounding Event: Gradually Appeared Status: Date Acquired: 10/29/2020 Comorbid Anemia, Coronary Artery Disease, Hypertension, Type II Weeks Of Treatment: 10 History: Diabetes, Osteomyelitis, Neuropathy Clustered Wound: No Wound Measurements Length: (cm) 0.7 Width: (cm) 0.2 Depth: (cm) 1.4 Area: (cm) 0.11 Volume: (cm) 0.154 % Reduction in Area: 12.7% % Reduction in Volume: -75% Epithelialization: None Tunneling: Yes Undermining: No Wound Description Classification: Grade 3 Wound Margin: Well defined, not attached Exudate Amount: Medium  Exudate Type: Serosanguineous Exudate Color: red, brown Foul Odor After Cleansing: No Slough/Fibrino No Wound Bed Granulation Amount: Large (67-100%) Exposed Structure Granulation Quality: Red Fascia Exposed: No Necrotic Amount: None Present (0%) Fat Layer (Subcutaneous Tissue) Exposed: Yes Tendon Exposed: No Muscle Exposed: No Joint Exposed: No Bone Exposed: Yes Treatment Notes Wound #3 (Foot) Wound Laterality: Right, Lateral, Proximal Cleanser Soap and Water Discharge Instruction: May shower and wash wound with dial antibacterial soap and water prior to dressing change. Wound Cleanser Discharge Instruction: Cleanse the wound with wound cleanser prior to applying a clean dressing using gauze sponges, not tissue or cotton balls. Peri-Wound Care Sween Lotion (Moisturizing lotion) Discharge Instruction: Apply moisturizing lotion to foot with dressing changes Topical Primary Dressing Hydrofera Blue Classic Foam, 2x2 in Discharge Instruction: rope Moisten with saline prior to applying to wound bed Secondary Dressing Woven Gauze Sponge, Non-Sterile 4x4 in Discharge Instruction: Apply over primary dressing as directed. ABD Pad, 5x9 Discharge Instruction: Apply  over primary dressing as directed. Secured With [employer] 4x5 (in/yd) Discharge Instruction: Secure with Coban as directed. Kerlix Roll Sterile, 4.5x3.1 (in/yd) Discharge Instruction: Secure with Kerlix as directed. 45M Medipore H Soft Cloth Surgical T ape, 2x2 (in/yd) Discharge Instruction: Secure dressing with tape as directed. Compression Wrap Compression Stockings Add-Ons Electronic Signature(s) Signed: 01/07/2021 4:40:08 PM By: Baruch Gouty RN, BSN Entered By: Baruch Gouty on 01/07/2021 10:55:10 -------------------------------------------------------------------------------- Wound Assessment Details Patient Name: Date of Service: [name], [name] 01/07/2021 10:45 A M Medical Record Number: [id number] Patient Account Number: [id number] Date of Birth/Sex: Treating RN: 24-Apr-1970 (50 y.o. [name] Primary Care Albertia Carvin: [name] Other Clinician: Referring Avea Mcgowen: Treating Aarin Sparkman/Extender: [name], [name] in Treatment: 17 Wound Status Wound Number: 6 Primary Diabetic Wound/Ulcer of the Lower Extremity Etiology: Wound Location: Left, Plantar Foot Wound Open Wounding Event: Gradually Appeared Status: Date Acquired: 12/03/2020 Comorbid Anemia, Coronary Artery Disease, Hypertension, Type II Weeks Of Treatment: 5 History: Diabetes, Osteomyelitis, Neuropathy Clustered Wound: No Wound Measurements Length: (cm) 0.1 Width: (cm) 0.1 Depth: (cm) 0.1 Area: (cm) 0.008 Volume: (cm) 0.001 % Reduction in Area: 98.4% % Reduction in Volume: 99.5% Epithelialization: Large (67-100%) Tunneling: No Undermining: No Wound Description Classification: Grade 1 Exudate Amount: None Present Foul Odor After Cleansing: No Slough/Fibrino No Wound Bed Granulation Amount: None Present (0%) Necrotic Amount: None Present (0%) Treatment Notes Wound #6 (Foot) Wound Laterality: Plantar, Left Cleanser Soap and Water Discharge  Instruction: May shower and wash wound with dial antibacterial soap and water prior to dressing change. Wound Cleanser Discharge Instruction: Cleanse the wound with wound cleanser prior to applying a clean dressing using gauze sponges, not tissue or cotton balls. Peri-Wound Care Sween Lotion (Moisturizing lotion) Discharge Instruction: Apply moisturizing lotion to foot with dressing changes Topical Primary Dressing Secondary Dressing Woven Gauze Sponge, Non-Sterile 4x4 in Discharge Instruction: Apply over primary dressing as directed. Optifoam Non-Adhesive Dressing, 4x4 in Discharge Instruction: Apply over primary dressing cut to make foam donut Secured With Coban Self-Adherent Wrap 4x5 (in/yd) Discharge Instruction: Secure with Coban as directed. Kerlix Roll Sterile, 4.5x3.1 (in/yd) Discharge Instruction: Secure with Kerlix as directed. 45M Medipore H Soft Cloth Surgical T ape, 2x2 (in/yd) Discharge Instruction: Secure dressing with tape as directed. Compression Wrap Compression Stockings Add-Ons Electronic Signature(s) Signed: 01/07/2021 4:40:08 PM By: Baruch Gouty RN, BSN Entered By: Baruch Gouty on 01/07/2021 10:55:52 -------------------------------------------------------------------------------- Wound Assessment Details Patient Name: Date of Service: [name], [place] [name] 01/07/2021 10:45 A M Medical Record Number: [id number] Patient Account  Number: [id number] Date of Birth/Sex: Treating RN: 14-Jul-1970 (50 y.o. [name] Primary Care Addis Bennie: [name] Other Clinician: Referring Romeo Zielinski: Treating Shaquisha Wynn/Extender: [name], [name] in Treatment: 17 Wound Status Wound Number: 7 Primary Diabetic Wound/Ulcer of the Lower Extremity Etiology: Wound Location: Right, Lateral, Plantar Foot Wound Open Wounding Event: Gradually Appeared Status: Date Acquired: 12/31/2020 Comorbid Anemia, Coronary Artery Disease, Hypertension, Type  II Weeks Of Treatment: 1 History: Diabetes, Osteomyelitis, Neuropathy Clustered Wound: No Wound Measurements Length: (cm) 0.5 Width: (cm) 0.5 Depth: (cm) 1 Area: (cm) 0.196 Volume: (cm) 0.196 % Reduction in Area: 0% % Reduction in Volume: 0% Epithelialization: None Wound Description Classification: Grade 3 Wound Margin: Well defined, not attached Exudate Amount: Large Exudate Type: Serosanguineous Exudate Color: red, brown Foul Odor After Cleansing: No Slough/Fibrino No Wound Bed Granulation Amount: Large (67-100%) Exposed Structure Granulation Quality: Red Fascia Exposed: No Necrotic Amount: None Present (0%) Fat Layer (Subcutaneous Tissue) Exposed: Yes Tendon Exposed: No Muscle Exposed: No Joint Exposed: No Bone Exposed: Yes Treatment Notes Wound #7 (Foot) Wound Laterality: Plantar, Right, Lateral Cleanser Soap and Water Discharge Instruction: May shower and wash wound with dial antibacterial soap and water prior to dressing change. Wound Cleanser Discharge Instruction: Cleanse the wound with wound cleanser prior to applying a clean dressing using gauze sponges, not tissue or cotton balls. Peri-Wound Care Sween Lotion (Moisturizing lotion) Discharge Instruction: Apply moisturizing lotion to foot with dressing changes Topical Primary Dressing Hydrofera Blue Classic Foam, 2x2 in Discharge Instruction: rope Moisten with saline prior to applying to wound bed Secondary Dressing Woven Gauze Sponge, Non-Sterile 4x4 in Discharge Instruction: Apply over primary dressing as directed. ABD Pad, 5x9 Discharge Instruction: Apply over primary dressing as directed. Secured With [employer] 4x5 (in/yd) Discharge Instruction: Secure with Coban as directed. Kerlix Roll Sterile, 4.5x3.1 (in/yd) Discharge Instruction: Secure with Kerlix as directed. 52M Medipore H Soft Cloth Surgical T ape, 2x2 (in/yd) Discharge Instruction: Secure dressing with tape as  directed. Compression Wrap Compression Stockings Add-Ons Electronic Signature(s) Signed: 01/07/2021 4:40:08 PM By: Baruch Gouty RN, BSN Entered By: Baruch Gouty on 01/07/2021 10:56:24 -------------------------------------------------------------------------------- Vitals Details Patient Name: Date of Service: [name], [name] 01/07/2021 10:45 A M Medical Record Number: [id number] Patient Account Number: [id number] Date of Birth/Sex: Treating RN: 1970/10/04 (50 y.o. [name] Primary Care Aritzel Krusemark: [name] Other Clinician: Referring Doralyn Kirkes: Treating Mae Cianci/Extender: [name], [name] in Treatment: 17 Vital Signs Time Taken: 10:53 Reference Range: 80 - 120 mg / dl Weight (lbs): 188 Notes see HBO encounter Electronic Signature(s) Signed: 01/07/2021 4:40:08 PM By: Baruch Gouty RN, BSN Entered By: Baruch Gouty on 01/07/2021 10:53:08

## 2021-01-07 NOTE — Progress Notes (Signed)
Diabetes discussed in office.

## 2021-01-07 NOTE — Progress Notes (Addendum)
[name] ([id number]) Visit Report for 01/07/2021 Arrival Information Details Patient Name: Date of Service: [place] RD, [name] 01/07/2021 8:00 A M Medical Record Number: [id number] Patient Account Number: [id number] Date of Birth/Sex: Treating RN: [birth year] (50 y.o. [name] Primary Care [name]: [name] Other Clinician: [name] Referring Bradan Congrove: Treating Cesar Rogerson/Extender: [name], [name] in Treatment: 6 Visit Information History Since Last Visit All ordered tests and consults were completed: Yes Patient Arrived: Cane Added or deleted any medications: No Arrival Time: 07:42 Any new allergies or adverse reactions: No Accompanied By: self Had a fall or experienced change in No Transfer Assistance: None activities of daily living that may affect Patient Identification Verified: Yes risk of falls: Secondary Verification Process Completed: Yes Signs or symptoms of abuse/neglect since last visito No Patient Requires Transmission-Based Precautions: No Hospitalized since last visit: No Patient Has Alerts: No Implantable device outside of the clinic excluding No cellular tissue based products placed in the center since last visit: Pain Present Now: No Electronic Signature(s) Signed: 01/07/2021 8:44:27 AM By: [name] EMT Entered By: [name] on 01/07/2021 08:44:27 -------------------------------------------------------------------------------- Encounter Discharge Information Details Patient Name: Date of Service: [name], [name] 01/07/2021 8:00 A M Medical Record Number: [id number] Patient Account Number: [id number] Date of Birth/Sex: Treating RN: [birth year] (50 y.o. [name] Primary Care Aaryanna Hyden: [name] Other Clinician: [name] Referring Jari Dipasquale: Treating Avett Reineck/Extender: [name], [name] in Treatment: 17 Encounter Discharge Information  Items Discharge Condition: Stable Ambulatory Status: Cane Discharge Destination: Other (Note Required) Transportation: Other Accompanied By: staff Schedule Follow-up Appointment: No Clinical Summary of Care: Notes Wound Care Encounter after treatment. Electronic Signature(s) Signed: 01/07/2021 11:17:37 AM By: [name] EMT Entered By: [name] on 01/07/2021 11:17:36 -------------------------------------------------------------------------------- Vitals Details Patient Name: Date of Service: [name], [name] 01/07/2021 8:00 A M Medical Record Number: [id number] Patient Account Number: [id number] Date of Birth/Sex: Treating RN: 01/09/1971 (50 y.o. [name] Primary Care Luva Metzger: [name] Other Clinician: [name] Referring Rumi Kolodziej: Treating Kaiulani Sitton/Extender: [name], [name] in Treatment: 17 Vital Signs Time Taken: 07:54 Temperature (F): 98.1 Weight (lbs): 188 Pulse (bpm): 93 Respiratory Rate (breaths/min): 20 Blood Pressure (mmHg): 139/82 Capillary Blood Glucose (mg/dl): 151 Reference Range: 80 - 120 mg / dl Electronic Signature(s) Signed: 01/07/2021 8:48:04 AM By: [name] EMT Entered By: [name] on 01/07/2021 08:48:04

## 2021-01-08 ENCOUNTER — Encounter (HOSPITAL_BASED_OUTPATIENT_CLINIC_OR_DEPARTMENT_OTHER): Payer: 59 | Admitting: Internal Medicine

## 2021-01-08 DIAGNOSIS — D72829 Elevated white blood cell count, unspecified: Secondary | ICD-10-CM | POA: Diagnosis not present

## 2021-01-08 LAB — GLUCOSE, CAPILLARY: Glucose-Capillary: 161 mg/dL — ABNORMAL HIGH (ref 70–99)

## 2021-01-08 NOTE — Progress Notes (Addendum)
CRISTOFHER, LIVECCHI ([id number]) Visit Report for 01/08/2021 HBO Details Patient Name: Date of Service: [place] RD, [name] 01/08/2021 8:00 A M Medical Record Number: [id number] Patient Account Number: [id number] Date of Birth/Sex: Treating RN: Nov 15, 1970 (50 y.o. [name], [name] Primary Care Rilei Kravitz: [name] Other Clinician: [name] Referring Aundra Pung: Treating Martesha Niedermeier/Extender: [name], [name] in Treatment: 17 HBO Treatment Course Details Treatment Course Number: 1 Ordering Mariangela Heldt: [name] Treatments Ordered: otal 40 HBO Treatment Start Date: 12/08/2020 HBO Indication: Diabetic Ulcer(s) of the Lower Extremity HBO Treatment Details Treatment Number: 11 Patient Type: Outpatient Chamber Type: Monoplace Chamber Serial #: [id number] Treatment Protocol: 2.5 ATA with 90 minutes oxygen, with two 5 minute air breaks Treatment Details Compression Rate Down: 2.0 psi / minute De-Compression Rate Up: 2.0 psi / minute A breaks and breathing ir Compress Tx Pressure periods Decompress Decompress Begins Reached (leave unused spaces Begins Ends blank) Chamber Pressure (ATA 1 2.5 2.5 2.5 2.5 2.5 - - 2.5 1 ) Clock Time (24 hr) 08:29 08:41 09:11 09:16 09:46 09:51 - - 10:21 10:32 Treatment Length: 123 (minutes) Treatment Segments: 4 Vital Signs Capillary Blood Glucose Reference Range: 80 - 120 mg / dl HBO Diabetic Blood Glucose Intervention Range: <131 mg/dl or >249 mg/dl Time Vitals Blood Respiratory Capillary Blood Glucose Pulse Action Type: Pulse: Temperature: Taken: Pressure: Rate: Glucose (mg/dl): Meter #: Oximetry (%) Taken: Pre 08:20 127/83 98 16 98.6 161 Post 10:38 135/94 91 16 97.6 Treatment Response Treatment Toleration: Well Treatment Completion Status: Treatment Completed without Adverse Event Treatment Notes Patient departed prior to measuring blood glucose. Patient was stable prior to departure and asymptomatic for  hypoglycemia. I contacted patient at 13. Patient stated he felt fine and would eat shortly. Additional Procedure Documentation Tissue Sevierity: Limited to breakdown of skin Chelle Cayton Notes No concerns with treatment given Physician HBO Attestation: I certify that I supervised this HBO treatment in accordance with Medicare guidelines. A trained emergency response team is readily available per Yes hospital policies and procedures. Continue HBOT as ordered. Yes Electronic Signature(s) Signed: 01/08/2021 4:49:53 PM By: [name] MD Previous Signature: 01/08/2021 1:20:58 PM Version By: [name] EMT Previous Signature: 01/08/2021 1:20:58 PM Version By: [name] EMT Previous Signature: 01/08/2021 11:06:55 AM Version By: [name] EMT Entered By: [name] on 01/08/2021 16:41:06 -------------------------------------------------------------------------------- HBO Safety Checklist Details Patient Name: Date of Service: [name], [name] 01/08/2021 8:00 A M Medical Record Number: [id number] Patient Account Number: [id number] Date of Birth/Sex: Treating RN: January 28, 1971 (50 y.o. [name], [name] Primary Care Olisa Quesnel: [name] Other Clinician: [name] Referring Debra Colon: Treating Graci Hulce/Extender: [name], [name] in Treatment: 17 HBO Safety Checklist Items Safety Checklist Consent Form Signed Patient voided / foley secured and emptied When did you last eato Breakfast Last dose of injectable or oral agent Ostomy pouch emptied and vented if applicable NA All implantable devices assessed, documented and approved NA Intravenous access site secured and place NA Valuables secured Linens and cotton and cotton/polyester blend (less than 51% polyester) Personal oil-based products / skin lotions / body lotions removed Wigs or hairpieces removed NA Smoking or tobacco materials removed NA Books / newspapers /  magazines / loose paper removed Cologne, aftershave, perfume and deodorant removed Jewelry removed (may wrap wedding band) Make-up removed NA Hair care products removed NA Battery operated devices (external) removed Heating patches and chemical warmers removed Titanium eyewear removed NA Nail polish cured greater than 10 hours NA Casting material cured  greater than 10 hours NA Hearing aids removed Loose dentures or partials removed Prosthetics have been removed NA Patient demonstrates correct use of air break device (if applicable) Patient concerns have been addressed Patient grounding bracelet on and cord attached to chamber Specifics for Inpatients (complete in addition to above) Medication sheet sent with patient NA Intravenous medications needed or due during therapy sent with patient NA Drainage tubes (e.g. nasogastric tube or chest tube secured and vented) NA Endotracheal or Tracheotomy tube secured NA Cuff deflated of air and inflated with saline NA Airway suctioned NA Notes Paper version used prior to treatment. Electronic Signature(s) Signed: 01/08/2021 11:04:19 AM By: [name] EMT Previous Signature: 01/08/2021 9:12:31 AM Version By: [name] EMT Entered By: [name] on 01/08/2021 11:04:18

## 2021-01-08 NOTE — Progress Notes (Signed)
[name], [name] ([id number]) Visit Report for 01/08/2021 SuperBill Details Patient Name: Date of Service: [place] RD, [name] 01/08/2021 Medical Record Number: [id number] Patient Account Number: [id number] Date of Birth/Sex: Treating RN: Feb 17, 1971 (50 y.o. [name], [name] Primary Care Provider: [name] Other Clinician: [name] Referring Provider: Treating Provider/Extender: [name], [name] in Treatment: 17 Diagnosis Coding ICD-10 Codes Code Description [phone] Other chronic osteomyelitis, right ankle and foot E11.621 Type 2 diabetes mellitus with foot ulcer L97.514 Non-pressure chronic ulcer of other part of right foot with necrosis of bone T81.31XS Disruption of external operation (surgical) wound, not elsewhere classified, sequela F17.218 Nicotine dependence, cigarettes, with other nicotine-induced disorders L97.522 Non-pressure chronic ulcer of other part of left foot with fat layer exposed Facility Procedures CPT4 Code Description Modifier Quantity [id number]-(Facility Use Only) HBOT full body chamber, 16min , 4 ICD-10 Diagnosis Description M86.671 Other chronic osteomyelitis, right ankle and foot E11.621 Type 2 diabetes mellitus with foot ulcer Physician Procedures Quantity CPT4 Code Description Modifier [id number] - WC PHYS HYPERBARIC OXYGEN THERAPY 1 ICD-10 Diagnosis Description M86.671 Other chronic osteomyelitis, right ankle and foot E11.621 Type 2 diabetes mellitus with foot ulcer Electronic Signature(s) Signed: 01/08/2021 11:07:24 AM By: [name] EMT Signed: 01/08/2021 4:49:53 PM By: [name] MD Entered By: [name] on 01/08/2021 11:07:23

## 2021-01-08 NOTE — Progress Notes (Addendum)
MAYFIELD, SCHOENE ([id number]) Visit Report for 01/08/2021 Arrival Information Details Patient Name: Date of Service: [place] RD, [name] 01/08/2021 8:00 A M Medical Record Number: [id number] Patient Account Number: [id number] Date of Birth/Sex: Treating RN: 1970/12/03 (50 y.o. [name], Meta.Reding Primary Care Amauri Keefe: [name] Other Clinician: [name] Referring Janaisha Tolsma: Treating Zannie Runkle/Extender: [name], [name] in Treatment: 45 Visit Information History Since Last Visit All ordered tests and consults were completed: Yes Patient Arrived: Cane Added or deleted any medications: No Arrival Time: 08:13 Any new allergies or adverse reactions: No Accompanied By: self Had a fall or experienced change in No Transfer Assistance: None activities of daily living that may affect Patient Identification Verified: Yes risk of falls: Secondary Verification Process Completed: Yes Signs or symptoms of abuse/neglect since last visito No Patient Requires Transmission-Based Precautions: No Hospitalized since last visit: No Patient Has Alerts: No Implantable device outside of the clinic excluding No cellular tissue based products placed in the center since last visit: Pain Present Now: No Electronic Signature(s) Signed: 01/08/2021 11:02:45 AM By: [name] EMT Previous Signature: 01/08/2021 9:05:53 AM Version By: [name] EMT Entered By: [name] on 01/08/2021 11:02:44 -------------------------------------------------------------------------------- Encounter Discharge Information Details Patient Name: Date of Service: [name], [name] 01/08/2021 8:00 A M Medical Record Number: [id number] Patient Account Number: [id number] Date of Birth/Sex: Treating RN: 1970-07-19 (50 y.o. [name] Primary Care Idamay Hosein: [name] Other Clinician: [name] Referring Hisashi Amadon: Treating Shana Younge/Extender: [name], [name] in Treatment: 17 Encounter Discharge Information Items Discharge Condition: Stable Ambulatory Status: Cane Discharge Destination: Home Transportation: Private Auto Accompanied By: self Schedule Follow-up Appointment: No Clinical Summary of Care: Electronic Signature(s) Signed: 01/08/2021 11:08:02 AM By: [name] EMT Entered By: [name] on 01/08/2021 11:08:02 -------------------------------------------------------------------------------- Carter Details Patient Name: Date of Service: [name], [name] 01/08/2021 8:00 A M Medical Record Number: [id number] Patient Account Number: [id number] Date of Birth/Sex: Treating RN: 04/27/1970 (50 y.o. [name], [name] Primary Care Marybell Robards: [name] Other Clinician: [name] Referring Lanasia Porras: Treating Laure Leone/Extender: [name], [name] in Treatment: 17 Vital Signs Time Taken: 08:20 Temperature (F): 98.6 Weight (lbs): 188 Pulse (bpm): 98 Respiratory Rate (breaths/min): 16 Blood Pressure (mmHg): 127/83 Capillary Blood Glucose (mg/dl): 161 Reference Range: 80 - 120 mg / dl Electronic Signature(s) Signed: 01/08/2021 11:03:21 AM By: [name] EMT Previous Signature: 01/08/2021 9:07:39 AM Version By: [name] EMT Entered By: [name] on 01/08/2021 11:03:21

## 2021-01-09 ENCOUNTER — Other Ambulatory Visit: Payer: Self-pay

## 2021-01-09 ENCOUNTER — Encounter (HOSPITAL_BASED_OUTPATIENT_CLINIC_OR_DEPARTMENT_OTHER): Payer: 59 | Admitting: Internal Medicine

## 2021-01-09 DIAGNOSIS — D72829 Elevated white blood cell count, unspecified: Secondary | ICD-10-CM | POA: Diagnosis not present

## 2021-01-09 LAB — GLUCOSE, CAPILLARY
Glucose-Capillary: 104 mg/dL — ABNORMAL HIGH (ref 70–99)
Glucose-Capillary: 91 mg/dL (ref 70–99)
Glucose-Capillary: 94 mg/dL (ref 70–99)
Glucose-Capillary: 117 mg/dL — ABNORMAL HIGH (ref 70–99)
Glucose-Capillary: 119 mg/dL — ABNORMAL HIGH (ref 70–99)

## 2021-01-09 NOTE — Progress Notes (Signed)
[name], [name] ([id number]) Visit Report for 01/09/2021 Arrival Information Details Patient Name: Date of Service: [name], [name] 01/09/2021 11:30 A M Medical Record Number: [id number] Patient Account Number: [id number] Date of Birth/Sex: Treating RN: 1970-05-19 (50 y.o. [name], [name] Primary Care Mateo Overbeck: [name] Other Clinician: Referring Loranda Mastel: Treating Jacquline Terrill/Extender: [name], [name] in Treatment: 18 Visit Information History Since Last Visit Added or deleted any medications: No Patient Arrived: [name] Any new allergies or adverse reactions: No Arrival Time: 10:56 Had a fall or experienced change in No Accompanied By: self activities of daily living that may affect Transfer Assistance: Manual risk of falls: Patient Identification Verified: Yes Signs or symptoms of abuse/neglect since last visito No Secondary Verification Process Completed: Yes Hospitalized since last visit: No Patient Requires Transmission-Based Precautions: No Implantable device outside of the clinic excluding No Patient Has Alerts: No cellular tissue based products placed in the center since last visit: Has Dressing in Place as Prescribed: Yes Pain Present Now: No Electronic Signature(s) Signed: 01/09/2021 12:47:13 PM By: Rhae Hammock RN Entered By: Rhae Hammock on 01/09/2021 10:56:56 -------------------------------------------------------------------------------- Clinic Level of Care Assessment Details Patient Name: Date of Service: Joseph RD, [name] 01/09/2021 11:30 A M Medical Record Number: [id number] Patient Account Number: [id number] Date of Birth/Sex: Treating RN: 1970/07/30 (50 y.o. [name], [name] Primary Care Chirstopher Iovino: [name] Other Clinician: Referring Kayen Grabel: Treating Bennet Kujawa/Extender: [name], [name] in Treatment: 18 Clinic Level of Care Assessment Items TOOL 4 Quantity Score X- 1 0 Use when  only an EandM is performed on FOLLOW-UP visit ASSESSMENTS - Nursing Assessment / Reassessment X- 1 10 Reassessment of Co-morbidities (includes updates in patient status) X- 1 5 Reassessment of Adherence to Treatment Plan ASSESSMENTS - Wound and Skin A ssessment / Reassessment X - Simple Wound Assessment / Reassessment - one wound 1 5 []  - 0 Complex Wound Assessment / Reassessment - multiple wounds X- 1 10 Dermatologic / Skin Assessment (not related to wound area) ASSESSMENTS - Focused Assessment []  - 0 Circumferential Edema Measurements - multi extremities []  - 0 Nutritional Assessment / Counseling / Intervention []  - 0 Lower Extremity Assessment (monofilament, tuning fork, pulses) []  - 0 Peripheral Arterial Disease Assessment (using hand held doppler) ASSESSMENTS - Ostomy and/or Continence Assessment and Care []  - 0 Incontinence Assessment and Management []  - 0 Ostomy Care Assessment and Management (repouching, etc.) PROCESS - Coordination of Care []  - 0 Simple Patient / Family Education for ongoing care X- 1 20 Complex (extensive) Patient / Family Education for ongoing care X- 1 10 Staff obtains Programmer, systems, Records, T Results / Process Orders est []  - 0 Staff telephones HHA, Nursing Homes / Clarify orders / etc []  - 0 Routine Transfer to another Facility (non-emergent condition) []  - 0 Routine Hospital Admission (non-emergent condition) []  - 0 New Admissions / Biomedical engineer / Ordering NPWT Apligraf, etc. , []  - 0 Emergency Hospital Admission (emergent condition) []  - 0 Simple Discharge Coordination X- 1 15 Complex (extensive) Discharge Coordination PROCESS - Special Needs []  - 0 Pediatric / Minor Patient Management []  - 0 Isolation Patient Management []  - 0 Hearing / Language / Visual special needs []  - 0 Assessment of Community assistance (transportation, D/C planning, etc.) []  - 0 Additional assistance / Altered mentation []  - 0 Support  Surface(s) Assessment (bed, cushion, seat, etc.) INTERVENTIONS - Wound Cleansing / Measurement []  - 0 Simple Wound Cleansing - one wound X- 3 5 Complex Wound Cleansing - multiple  wounds []  - 0 Wound Imaging (photographs - any number of wounds) []  - 0 Wound Tracing (instead of photographs) []  - 0 Simple Wound Measurement - one wound X- 3 5 Complex Wound Measurement - multiple wounds INTERVENTIONS - Wound Dressings []  - 0 Small Wound Dressing one or multiple wounds X- 3 15 Medium Wound Dressing one or multiple wounds []  - 0 Large Wound Dressing one or multiple wounds []  - 0 Application of Medications - topical []  - 0 Application of Medications - injection INTERVENTIONS - Miscellaneous []  - 0 External ear exam []  - 0 Specimen Collection (cultures, biopsies, blood, body fluids, etc.) []  - 0 Specimen(s) / Culture(s) sent or taken to Lab for analysis []  - 0 Patient Transfer (multiple staff / Civil Service fast streamer / Similar devices) []  - 0 Simple Staple / Suture removal (25 or less) []  - 0 Complex Staple / Suture removal (26 or more) []  - 0 Hypo / Hyperglycemic Management (close monitor of Blood Glucose) []  - 0 Ankle / Brachial Index (ABI) - do not check if billed separately X- 1 5 Vital Signs Has the patient been seen at the hospital within the last three years: Yes Total Score: 155 Level Of Care: New/Established - Level 4 Electronic Signature(s) Signed: 01/09/2021 12:47:13 PM By: Rhae Hammock RN Entered By: Rhae Hammock on 01/09/2021 11:38:17 -------------------------------------------------------------------------------- Encounter Discharge Information Details Patient Name: Date of Service: Joseph Hahn RD, [name] 01/09/2021 11:30 A M Medical Record Number: [id number] Patient Account Number: [id number] Date of Birth/Sex: Treating RN: 05-Jan-1971 (50 y.o. [name], [name] Primary Care Nelta Caudill: [name] Other Clinician: Referring Brodi Nery: Treating  Winnona Wargo/Extender: [name], [name] in Treatment: 18 Encounter Discharge Information Items Discharge Condition: Stable Ambulatory Status: Ambulatory Discharge Destination: Home Transportation: Private Auto Accompanied By: self Schedule Follow-up Appointment: Yes Clinical Summary of Care: Patient Declined Electronic Signature(s) Signed: 01/09/2021 12:47:13 PM By: Rhae Hammock RN Entered By: Rhae Hammock on 01/09/2021 11:35:30 -------------------------------------------------------------------------------- Patient/Caregiver Education Details Patient Name: Date of Service: [name], [name] 10/14/2022andnbsp11:30 A M Medical Record Number: [id number] Patient Account Number: [id number] Date of Birth/Gender: Treating RN: September 22, 1970 (50 y.o. Erie Noe Primary Care Physician: [name] Other Clinician: Referring Physician: Treating Physician/Extender: Bobbye Charleston in Treatment: 18 Education Assessment Education Provided To: Patient Education Topics Provided Infection: Methods: Explain/Verbal Responses: Reinforcements needed, State content correctly Wound/Skin Impairment: Methods: Explain/Verbal Responses: Reinforcements needed, State content correctly Electronic Signature(s) Signed: 01/09/2021 12:47:13 PM By: Rhae Hammock RN Entered By: Rhae Hammock on 01/09/2021 11:34:28 -------------------------------------------------------------------------------- Wound Assessment Details Patient Name: Date of Service: Joseph Hahn RD, [name] 01/09/2021 11:30 A M Medical Record Number: [id number] Patient Account Number: [id number] Date of Birth/Sex: Treating RN: 05/30/1970 (50 y.o. [name], [name] Primary Care Cleotha Whalin: [name] Other Clinician: Referring Sabrine Patchen: Treating Brenda Samano/Extender: [name], [name] in Treatment: 18 Wound Status Wound Number: 3 Primary Etiology: Diabetic  Wound/Ulcer of the Lower Extremity Wound Location: Right, Proximal, Lateral Foot Wound Status: Open Wounding Event: Gradually Appeared Date Acquired: 10/29/2020 Weeks Of Treatment: 10 Clustered Wound: No Wound Measurements Length: (cm) 0.7 Width: (cm) 0.2 Depth: (cm) 1.4 Area: (cm) 0.11 Volume: (cm) 0.154 % Reduction in Area: 12.7% % Reduction in Volume: -75% Wound Description Classification: Grade 3 Exudate Amount: Medium Exudate Type: Serosanguineous Exudate Color: red, brown Treatment Notes Wound #3 (Foot) Wound Laterality: Right, Lateral, Proximal Cleanser Soap and Water Discharge Instruction: May shower and wash wound with dial antibacterial soap and water prior to dressing change. Wound  Cleanser Discharge Instruction: Cleanse the wound with wound cleanser prior to applying a clean dressing using gauze sponges, not tissue or cotton balls. Peri-Wound Care Sween Lotion (Moisturizing lotion) Discharge Instruction: Apply moisturizing lotion to foot with dressing changes Topical Primary Dressing Hydrofera Blue Classic Foam, 2x2 in Discharge Instruction: rope Moisten with saline prior to applying to wound bed Secondary Dressing Woven Gauze Sponge, Non-Sterile 4x4 in Discharge Instruction: Apply over primary dressing as directed. ABD Pad, 5x9 Discharge Instruction: Apply over primary dressing as directed. Secured With [employer] 4x5 (in/yd) Discharge Instruction: Secure with Coban as directed. Kerlix Roll Sterile, 4.5x3.1 (in/yd) Discharge Instruction: Secure with Kerlix as directed. 58M Medipore H Soft Cloth Surgical T ape, 2x2 (in/yd) Discharge Instruction: Secure dressing with tape as directed. Compression Wrap Compression Stockings Add-Ons Electronic Signature(s) Signed: 01/09/2021 12:47:13 PM By: Rhae Hammock RN Entered By: Rhae Hammock on 01/09/2021  10:57:33 -------------------------------------------------------------------------------- Wound Assessment Details Patient Name: Date of Service: Joseph Hahn RD, [name] 01/09/2021 11:30 A M Medical Record Number: [id number] Patient Account Number: [id number] Date of Birth/Sex: Treating RN: Aug 23, 1970 (50 y.o. [name], [name] Primary Care Tully Burgo: [name] Other Clinician: Referring Sonika Levins: Treating Desmund Elman/Extender: [name], [name] in Treatment: 18 Wound Status Wound Number: 6 Primary Etiology: Diabetic Wound/Ulcer of the Lower Extremity Wound Location: Left, Plantar Foot Wound Status: Open Wounding Event: Gradually Appeared Date Acquired: 12/03/2020 Weeks Of Treatment: 5 Clustered Wound: No Wound Measurements Length: (cm) 0.1 Width: (cm) 0.1 Depth: (cm) 0.1 Area: (cm) 0.008 Volume: (cm) 0.001 % Reduction in Area: 98.4% % Reduction in Volume: 99.5% Wound Description Classification: Grade 1 Exudate Amount: None Present Treatment Notes Wound #6 (Foot) Wound Laterality: Plantar, Left Cleanser Soap and Water Discharge Instruction: May shower and wash wound with dial antibacterial soap and water prior to dressing change. Wound Cleanser Discharge Instruction: Cleanse the wound with wound cleanser prior to applying a clean dressing using gauze sponges, not tissue or cotton balls. Peri-Wound Care Sween Lotion (Moisturizing lotion) Discharge Instruction: Apply moisturizing lotion to foot with dressing changes Topical Primary Dressing Hydrofera Blue Classic Foam, 2x2 in Discharge Instruction: Moisten with saline prior to applying to wound bed Secondary Dressing Woven Gauze Sponge, Non-Sterile 4x4 in Discharge Instruction: Apply over primary dressing as directed. Optifoam Non-Adhesive Dressing, 4x4 in Discharge Instruction: Apply over primary dressing cut to make foam donut Secured With Coban Self-Adherent Wrap 4x5 (in/yd) Discharge  Instruction: Secure with Coban as directed. Kerlix Roll Sterile, 4.5x3.1 (in/yd) Discharge Instruction: Secure with Kerlix as directed. 58M Medipore H Soft Cloth Surgical T ape, 2x2 (in/yd) Discharge Instruction: Secure dressing with tape as directed. Compression Wrap Compression Stockings Add-Ons Electronic Signature(s) Signed: 01/09/2021 12:47:13 PM By: Rhae Hammock RN Entered By: Rhae Hammock on 01/09/2021 10:57:33 -------------------------------------------------------------------------------- Wound Assessment Details Patient Name: Date of Service: Joseph Hahn RD, [name] 01/09/2021 11:30 A M Medical Record Number: [id number] Patient Account Number: [id number] Date of Birth/Sex: Treating RN: 09/01/1970 (50 y.o. [name], [name] Primary Care Malachai Schalk: [name] Other Clinician: Referring Currie Dennin: Treating Walker Sitar/Extender: [name], [name] in Treatment: 18 Wound Status Wound Number: 7 Primary Etiology: Diabetic Wound/Ulcer of the Lower Extremity Wound Location: Right, Lateral, Plantar Foot Wound Status: Open Wounding Event: Gradually Appeared Date Acquired: 12/31/2020 Weeks Of Treatment: 1 Clustered Wound: No Wound Measurements Length: (cm) 0.5 Width: (cm) 0.5 Depth: (cm) 1 Area: (cm) 0.196 Volume: (cm) 0.196 % Reduction in Area: 0% % Reduction in Volume: 0% Wound Description Classification: Grade 3 Exudate Amount: Large Exudate Type: Serosanguineous  Exudate Color: red, brown Treatment Notes Wound #7 (Foot) Wound Laterality: Plantar, Right, Lateral Cleanser Soap and Water Discharge Instruction: May shower and wash wound with dial antibacterial soap and water prior to dressing change. Wound Cleanser Discharge Instruction: Cleanse the wound with wound cleanser prior to applying a clean dressing using gauze sponges, not tissue or cotton balls. Peri-Wound Care Sween Lotion (Moisturizing lotion) Discharge Instruction: Apply  moisturizing lotion to foot with dressing changes Topical Primary Dressing Hydrofera Blue Classic Foam, 2x2 in Discharge Instruction: rope Moisten with saline prior to applying to wound bed Secondary Dressing Woven Gauze Sponge, Non-Sterile 4x4 in Discharge Instruction: Apply over primary dressing as directed. ABD Pad, 5x9 Discharge Instruction: Apply over primary dressing as directed. Secured With [employer] 4x5 (in/yd) Discharge Instruction: Secure with Coban as directed. Kerlix Roll Sterile, 4.5x3.1 (in/yd) Discharge Instruction: Secure with Kerlix as directed. 93M Medipore H Soft Cloth Surgical T ape, 2x2 (in/yd) Discharge Instruction: Secure dressing with tape as directed. Compression Wrap Compression Stockings Add-Ons Electronic Signature(s) Signed: 01/09/2021 12:47:13 PM By: Rhae Hammock RN Entered By: Rhae Hammock on 01/09/2021 10:57:33 -------------------------------------------------------------------------------- Vitals Details Patient Name: Date of Service: Joseph Hahn RD, [name] 01/09/2021 11:30 A M Medical Record Number: [id number] Patient Account Number: [id number] Date of Birth/Sex: Treating RN: 21-Mar-1971 (50 y.o. [name], [name] Primary Care Isadora Delorey: [name] Other Clinician: Referring Tashala Cumbo: Treating Arnett Duddy/Extender: [name], [name] in Treatment: 18 Vital Signs Time Taken: 10:56 Temperature (F): 98.6 Weight (lbs): 188 Pulse (bpm): 98 Respiratory Rate (breaths/min): 16 Blood Pressure (mmHg): 127/83 Capillary Blood Glucose (mg/dl): 161 Reference Range: 80 - 120 mg / dl Electronic Signature(s) Signed: 01/09/2021 12:47:13 PM By: Rhae Hammock RN Entered By: Rhae Hammock on 01/09/2021 10:57:13

## 2021-01-09 NOTE — Progress Notes (Addendum)
[name], [name] ([id number]) Visit Report for 01/09/2021 Arrival Information Details Patient Name: Date of Service: [name], [name] Joseph J. 01/09/2021 8:00 A M Medical Record Number: [id number] Patient Account Number: [id number] Date of Birth/Sex: Treating RN: 10-31-1970 (50 y.o. [name], [name] Primary Care Ernestene Coover: [name] Other Clinician: [name] Referring Oskar Cretella: Treating Dmarius Reeder/Extender: [name], [name] in Treatment: 18 Visit Information History Since Last Visit All ordered tests and consults were completed: Yes Patient Arrived: [name] Added or deleted any medications: No Arrival Time: 07:37 Any new allergies or adverse reactions: No Accompanied By: self Had a fall or experienced change in No Transfer Assistance: None activities of daily living that may affect Patient Identification Verified: Yes risk of falls: Secondary Verification Process Completed: Yes Signs or symptoms of abuse/neglect since last visito No Patient Requires Transmission-Based Precautions: No Hospitalized since last visit: No Patient Has Alerts: No Implantable device outside of the clinic excluding No cellular tissue based products placed in the center since last visit: Pain Present Now: No Electronic Signature(s) Signed: 01/09/2021 11:58:50 AM By: [name] EMT Entered By: [name] on 01/09/2021 11:58:50 -------------------------------------------------------------------------------- Encounter Discharge Information Details Patient Name: Date of Service: Joseph Hahn RD, Joseph Joseph J. 01/09/2021 8:00 A M Medical Record Number: [id number] Patient Account Number: [id number] Date of Birth/Sex: Treating RN: May 26, 1970 (50 y.o. [name] Primary Care Sallie Staron: [name] Other Clinician: [name] Referring Devin Foskey: Treating Alexx Mcburney/Extender: [name], [name] in Treatment: 18 Encounter Discharge Information  Items Discharge Condition: Stable Ambulatory Status: Cane Discharge Destination: Other (Note Required) Transportation: Other Accompanied By: staff Schedule Follow-up Appointment: No Clinical Summary of Care: Notes Wound Care Encounter after treatment. Electronic Signature(s) Signed: 01/09/2021 12:25:01 PM By: [name] EMT Entered By: [name] on 01/09/2021 12:25:01 -------------------------------------------------------------------------------- [place] Details Patient Name: Date of Service: Joseph Hahn RD, Joseph Joseph J. 01/09/2021 8:00 A M Medical Record Number: [id number] Patient Account Number: [id number] Date of Birth/Sex: Treating RN: 21-Jul-1970 (50 y.o. [name] Primary Care Eddrick Dilone: [name] Other Clinician: [name] Referring Jauna Raczynski: Treating Asia Dusenbury/Extender: [name], [name] in Treatment: 18 Vital Signs Time Taken: 08:06 Temperature (F): 97.8 Weight (lbs): 188 Pulse (bpm): 93 Respiratory Rate (breaths/min): 16 Blood Pressure (mmHg): 145/90 Capillary Blood Glucose (mg/dl): 91 Reference Range: 80 - 120 mg / dl Electronic Signature(s) Signed: 01/09/2021 12:17:14 PM By: [name] EMT Entered By: [name] on 01/09/2021 12:17:13

## 2021-01-09 NOTE — Progress Notes (Signed)
[name], [name] ([id number]) Visit Report for 01/09/2021 [place] Details Patient Name: Date of Service: [place] RD, [name] 01/09/2021 Medical Record Number: [id number] Patient Account Number: [id number] Date of Birth/Sex: Treating RN: 07-18-1970 (50 y.o. [name] Primary Care Provider: [name] Other Clinician: [name] Referring Provider: Treating Provider/Extender: [name], [name] in Treatment: 18 Diagnosis Coding ICD-10 Codes Code Description [phone] Other chronic osteomyelitis, right ankle and foot E11.621 Type 2 diabetes mellitus with foot ulcer L97.514 Non-pressure chronic ulcer of other part of right foot with necrosis of bone T81.31XS Disruption of external operation (surgical) wound, not elsewhere classified, sequela F17.218 Nicotine dependence, cigarettes, with other nicotine-induced disorders L97.522 Non-pressure chronic ulcer of other part of left foot with fat layer exposed Facility Procedures CPT4 Code Description Modifier Quantity [id number]-(Facility Use Only) HBOT full body chamber, 15min , 4 ICD-10 Diagnosis Description M86.671 Other chronic osteomyelitis, right ankle and foot E11.621 Type 2 diabetes mellitus with foot ulcer Physician Procedures Quantity CPT4 Code Description Modifier [id number] - WC PHYS HYPERBARIC OXYGEN THERAPY 1 ICD-10 Diagnosis Description M86.671 Other chronic osteomyelitis, right ankle and foot E11.621 Type 2 diabetes mellitus with foot ulcer Electronic Signature(s) Signed: 01/09/2021 12:24:06 PM By: [name] EMT Signed: 01/09/2021 12:39:46 PM By: [name] MD Entered By: [name] on 01/09/2021 12:24:05

## 2021-01-09 NOTE — Progress Notes (Addendum)
JARYAN, CHICOINE ([id number]) Visit Report for 01/09/2021 HBO Details Patient Name: Date of Service: [address], [name] 01/09/2021 8:00 A M Medical Record Number: [id number] Patient Account Number: [id number] Date of Birth/Sex: Treating RN: 11-24-1970 (50 y.o. [name] Primary Care Evelynn Hench: [name] Other Clinician: [name] Referring Veida Spira: Treating Toma Erichsen/Extender: [name], [name] in Treatment: 18 HBO Treatment Course Details Treatment Course Number: 1 Ordering Alyssa Mancera: [name] Treatments Ordered: otal 40 HBO Treatment Start Date: 12/08/2020 HBO Indication: Diabetic Ulcer(s) of the Lower Extremity HBO Treatment Details Treatment Number: 12 Patient Type: Outpatient Chamber Type: Monoplace Chamber Serial #: [id number] Treatment Protocol: 2.5 ATA with 90 minutes oxygen, with two 5 minute air breaks Treatment Details Compression Rate Down: 2.0 psi / minute De-Compression Rate Up: 2.0 psi / minute A breaks and breathing ir Compress Tx Pressure periods Decompress Decompress Begins Reached (leave unused spaces Begins Ends blank) Chamber Pressure (ATA 1 2.5 2.5 2.5 2.5 2.5 - - 2.5 1 ) Clock Time (24 hr) 08:39 08:50 09:10 09:15 09:45 09:50 - - 10:20 10:31 Treatment Length: 112 (minutes) Treatment Segments: 4 Vital Signs Capillary Blood Glucose Reference Range: 80 - 120 mg / dl HBO Diabetic Blood Glucose Intervention Range: <131 mg/dl or >249 mg/dl Type: Time Vitals Blood Pulse: Respiratory Temperature: Capillary Blood Glucose Pulse Action Taken: Pressure: Rate: Glucose (mg/dl): Meter #: Oximetry (%) Taken: Pre 08:06 145/90 93 16 97.8 91 2 8 oz Glucerna Shake given Post 10:44 135/84 89 16 97.7 119 discharge per protocol >101 mg/dL Pre 08:03 104 2 Pre 08:18 94 2 blood glucose decreased wait to test, pt choice Pre 08:36 117 2 Treatment Response Treatment Toleration: Well Treatment Completion Status: Treatment Completed  without Adverse Event Additional Procedure Documentation Tissue Sevierity: Limited to breakdown of skin Nirav Sweda Notes No concerns with treatment given Physician HBO Attestation: I certify that I supervised this HBO treatment in accordance with Medicare guidelines. A trained emergency response team is readily available per Yes hospital policies and procedures. Continue HBOT as ordered. Yes Electronic Signature(s) Signed: 01/09/2021 12:39:46 PM By: [name] MD Previous Signature: 01/09/2021 12:23:40 PM Version By: [name] EMT Entered By: [name] on 01/09/2021 12:37:42 -------------------------------------------------------------------------------- HBO Safety Checklist Details Patient Name: Date of Service: [name], [name] 01/09/2021 8:00 A M Medical Record Number: [id number] Patient Account Number: [id number] Date of Birth/Sex: Treating RN: March 22, 1971 (50 y.o. [name] Primary Care Gerrod Maule: [name] Other Clinician: [name] Referring Bransyn Adami: Treating Baneza Bartoszek/Extender: [name], [name] in Treatment: 18 HBO Safety Checklist Items Safety Checklist Consent Form Signed Patient voided / foley secured and emptied When did you last eato Breakfast Last dose of injectable or oral agent 0630 Ostomy pouch emptied and vented if applicable NA All implantable devices assessed, documented and approved NA Intravenous access site secured and place NA Valuables secured Linens and cotton and cotton/polyester blend (less than 51% polyester) Personal oil-based products / skin lotions / body lotions removed Wigs or hairpieces removed NA Smoking or tobacco materials removed NA Books / newspapers / magazines / loose paper removed Cologne, aftershave, perfume and deodorant removed Jewelry removed (may wrap wedding band) Make-up removed NA Hair care products removed Battery operated devices (external)  removed Heating patches and chemical warmers removed Titanium eyewear removed NA Nail polish cured greater than 10 hours NA Casting material cured greater than 10 hours NA Hearing aids removed NA Loose dentures or partials removed NA Prosthetics have been removed NA Patient  demonstrates correct use of air break device (if applicable) Patient concerns have been addressed Patient grounding bracelet on and cord attached to chamber Specifics for Inpatients (complete in addition to above) Medication sheet sent with patient NA Intravenous medications needed or due during therapy sent with patient NA Drainage tubes (e.g. nasogastric tube or chest tube secured and vented) NA Endotracheal or Tracheotomy tube secured NA Cuff deflated of air and inflated with saline NA Airway suctioned NA Electronic Signature(s) Signed: 01/09/2021 12:18:27 PM By: [name] EMT Entered By: [name] on 01/09/2021 12:18:27

## 2021-01-09 NOTE — Progress Notes (Signed)
CAMRAN, KEADY ([id number]) Visit Report for 01/09/2021 SuperBill Details Patient Name: Date of Service: [address] [address], [name] 01/09/2021 Medical Record Number: [id number] Patient Account Number: [id number] Date of Birth/Sex: Treating RN: [birth year] (50 y.o. [name], [name] Primary Care Provider: [name] Other Clinician: Referring Provider: Treating Provider/Extender: [name], [name] in Treatment: 18 Diagnosis Coding ICD-10 Codes Code Description 2180030752 Other chronic osteomyelitis, right ankle and foot E11.621 Type 2 diabetes mellitus with foot ulcer L97.514 Non-pressure chronic ulcer of other part of right foot with necrosis of bone T81.31XS Disruption of external operation (surgical) wound, not elsewhere classified, sequela F17.218 Nicotine dependence, cigarettes, with other nicotine-induced disorders L97.522 Non-pressure chronic ulcer of other part of left foot with fat layer exposed Facility Procedures CPT4 Code Description Modifier Quantity [id number] - WOUND CARE VISIT-LEV 4 EST PT 1 Electronic Signature(s) Signed: 01/09/2021 12:39:46 PM By: [name] MD Signed: 01/09/2021 12:47:13 PM By: Rhae Hammock RN Entered By: Rhae Hammock on 01/09/2021 11:38:25

## 2021-01-12 ENCOUNTER — Encounter (HOSPITAL_BASED_OUTPATIENT_CLINIC_OR_DEPARTMENT_OTHER): Payer: 59 | Admitting: Internal Medicine

## 2021-01-12 ENCOUNTER — Other Ambulatory Visit: Payer: Self-pay

## 2021-01-12 DIAGNOSIS — D72829 Elevated white blood cell count, unspecified: Secondary | ICD-10-CM | POA: Diagnosis not present

## 2021-01-12 LAB — GLUCOSE, CAPILLARY
Glucose-Capillary: 159 mg/dL — ABNORMAL HIGH (ref 70–99)
Glucose-Capillary: 186 mg/dL — ABNORMAL HIGH (ref 70–99)

## 2021-01-12 NOTE — Progress Notes (Addendum)
SHUBH, CHIARA ([id number]) Visit Report for 01/12/2021 HBO Details Patient Name: Date of Service: [name], [name] 01/12/2021 8:00 A M Medical Record Number: [id number] Patient Account Number: [id number] Date of Birth/Sex: Treating RN: 1970/07/15 (50 y.o. [name] Primary Care Karol Skarzynski: [name] Other Clinician: [name] Referring Arnesha Schiraldi: Treating Amerie Beaumont/Extender: [name], [name] in Treatment: 18 HBO Treatment Course Details Treatment Course Number: 1 Ordering Collene Massimino: [name] Treatments Ordered: otal 40 HBO Treatment Start Date: 12/08/2020 HBO Indication: Diabetic Ulcer(s) of the Lower Extremity HBO Treatment Details Treatment Number: 13 Patient Type: Outpatient Chamber Type: Monoplace Chamber Serial #: [id number] Treatment Protocol: 2.5 ATA with 90 minutes oxygen, with two 5 minute air breaks Treatment Details Compression Rate Down: 2.0 psi / minute De-Compression Rate Up: 2.0 psi / minute A breaks and breathing ir Compress Tx Pressure periods Decompress Decompress Begins Reached (leave unused spaces Begins Ends blank) Chamber Pressure (ATA 1 2.5 2.5 2.5 2.5 2.5 - - 2.5 1 ) Clock Time (24 hr) 08:29 08:40 09:10 09:15 09:45 09:50 - - 10:20 10:31 Treatment Length: 122 (minutes) Treatment Segments: 4 Vital Signs Capillary Blood Glucose Reference Range: 80 - 120 mg / dl HBO Diabetic Blood Glucose Intervention Range: <131 mg/dl or >249 mg/dl Time Vitals Blood Respiratory Capillary Blood Glucose Pulse Action Type: Pulse: Temperature: Taken: Pressure: Rate: Glucose (mg/dl): Meter #: Oximetry (%) Taken: Pre 08:22 132/91 97 18 97.6 159 Post 10:36 131/86 89 18 97.5 186 Treatment Response Treatment Toleration: Well Treatment Completion Status: Treatment Completed without Adverse Event Additional Procedure Documentation Tissue Sevierity: Limited to breakdown of skin Yoland Scherr Notes No concerns with treatment  given Physician HBO Attestation: I certify that I supervised this HBO treatment in accordance with Medicare guidelines. A trained emergency response team is readily available per Yes hospital policies and procedures. Continue HBOT as ordered. Yes Electronic Signature(s) Signed: 01/12/2021 5:32:36 PM By: [name] MD Previous Signature: 01/12/2021 11:52:24 AM Version By: [name] EMT Entered By: [name] on 01/12/2021 16:38:55 -------------------------------------------------------------------------------- HBO Safety Checklist Details Patient Name: Date of Service: Joseph Hahn RD, [name] 01/12/2021 8:00 A M Medical Record Number: [id number] Patient Account Number: [id number] Date of Birth/Sex: Treating RN: July 23, 1970 (50 y.o. [name] Primary Care Paige Vanderwoude: [name] Other Clinician: [name] Referring Merinda Victorino: Treating Shanna Strength/Extender: [name], [name] in Treatment: 18 HBO Safety Checklist Items Safety Checklist Consent Form Signed Patient voided / foley secured and emptied When did you last eato Breakfast Last dose of injectable or oral agent n/a Ostomy pouch emptied and vented if applicable NA All implantable devices assessed, documented and approved NA Intravenous access site secured and place NA Valuables secured NA Linens and cotton and cotton/polyester blend (less than 51% polyester) Personal oil-based products / skin lotions / body lotions removed Wigs or hairpieces removed NA Smoking or tobacco materials removed Books / newspapers / magazines / loose paper removed Cologne, aftershave, perfume and deodorant removed Jewelry removed (may wrap wedding band) Make-up removed NA Hair care products removed Battery operated devices (external) removed Heating patches and chemical warmers removed Titanium eyewear removed NA Nail polish cured greater than 10 hours NA Casting material cured greater than  10 hours NA Hearing aids removed NA Loose dentures or partials removed NA Prosthetics have been removed NA Patient demonstrates correct use of air break device (if applicable) Patient concerns have been addressed Patient grounding bracelet on and cord attached to chamber Specifics for Inpatients (complete in addition to above)  Medication sheet sent with patient NA Intravenous medications needed or due during therapy sent with patient NA Drainage tubes (e.g. nasogastric tube or chest tube secured and vented) NA Endotracheal or Tracheotomy tube secured NA Cuff deflated of air and inflated with saline NA Airway suctioned NA Notes Paper version used prior to treatment. Electronic Signature(s) Signed: 01/12/2021 9:42:39 AM By: [name] EMT Previous Signature: 01/12/2021 9:41:43 AM Version By: [name] EMT Entered By: [name] on 01/12/2021 09:42:37

## 2021-01-12 NOTE — Progress Notes (Signed)
[name], [name] ([id number]) Visit Report for 01/12/2021 Arrival Information Details Patient Name: Date of Service: [address], [name] 01/12/2021 11:15 A M Medical Record Number: [id number] Patient Account Number: [id number] Date of Birth/Sex: Treating RN: 11-Apr-1970 (50 y.o. [name] Primary Care Annya Lizana: Minette Brine, [place] Other Clinician: Referring Kaity Pitstick: Treating Trudi Morgenthaler/Extender: [name], [name] in Treatment: 18 Visit Information History Since Last Visit Added or deleted any medications: No Patient Arrived: [name] Any new allergies or adverse reactions: No Arrival Time: 11:33 Had a fall or experienced change in No Accompanied By: self activities of daily living that may affect Transfer Assistance: None risk of falls: Patient Identification Verified: Yes Signs or symptoms of abuse/neglect since last visito No Secondary Verification Process Completed: Yes Hospitalized since last visit: No Patient Requires Transmission-Based Precautions: No Implantable device outside of the clinic excluding No Patient Has Alerts: No cellular tissue based products placed in the center since last visit: Has Dressing in Place as Prescribed: Yes Pain Present Now: Yes Electronic Signature(s) Signed: 01/12/2021 5:48:05 PM By: Baruch Gouty RN, BSN Entered By: Baruch Gouty on 01/12/2021 11:33:37 -------------------------------------------------------------------------------- Clinic Level of Care Assessment Details Patient Name: Date of Service: [facility] RD, [place]. 01/12/2021 11:15 A M Medical Record Number: [id number] Patient Account Number: [id number] Date of Birth/Sex: Treating RN: 1971/02/22 (50 y.o. [name] Primary Care [name]: [name] Other Clinician: Referring [name]: Treating [name]/Extender: [name], [name] in Treatment: 18 Clinic Level of Care Assessment Items TOOL 4 Quantity Score []  - 0 Use when only  an EandM is performed on FOLLOW-UP visit ASSESSMENTS - Nursing Assessment / Reassessment X- 1 10 Reassessment of Co-morbidities (includes updates in patient status) X- 1 5 Reassessment of Adherence to Treatment Plan ASSESSMENTS - Wound and Skin A ssessment / Reassessment []  - 0 Simple Wound Assessment / Reassessment - one wound X- 2 5 Complex Wound Assessment / Reassessment - multiple wounds []  - 0 Dermatologic / Skin Assessment (not related to wound area) ASSESSMENTS - Focused Assessment []  - 0 Circumferential Edema Measurements - multi extremities []  - 0 Nutritional Assessment / Counseling / Intervention []  - 0 Lower Extremity Assessment (monofilament, tuning fork, pulses) []  - 0 Peripheral Arterial Disease Assessment (using hand held doppler) ASSESSMENTS - Ostomy and/or Continence Assessment and Care []  - 0 Incontinence Assessment and Management []  - 0 Ostomy Care Assessment and Management (repouching, etc.) PROCESS - Coordination of Care X - Simple Patient / Family Education for ongoing care 1 15 []  - 0 Complex (extensive) Patient / Family Education for ongoing care X- 1 10 Staff obtains Programmer, systems, Records, T Results / Process Orders est []  - 0 Staff telephones HHA, Nursing Homes / Clarify orders / etc []  - 0 Routine Transfer to another Facility (non-emergent condition) []  - 0 Routine Hospital Admission (non-emergent condition) []  - 0 New Admissions / Biomedical engineer / Ordering NPWT Apligraf, etc. , []  - 0 Emergency Hospital Admission (emergent condition) X- 1 10 Simple Discharge Coordination []  - 0 Complex (extensive) Discharge Coordination PROCESS - Special Needs []  - 0 Pediatric / Minor Patient Management []  - 0 Isolation Patient Management []  - 0 Hearing / Language / Visual special needs []  - 0 Assessment of Community assistance (transportation, D/C planning, etc.) []  - 0 Additional assistance / Altered mentation []  - 0 Support Surface(s)  Assessment (bed, cushion, seat, etc.) INTERVENTIONS - Wound Cleansing / Measurement []  - 0 Simple Wound Cleansing - one wound X- 2 5 Complex Wound Cleansing -  multiple wounds []  - 0 Wound Imaging (photographs - any number of wounds) []  - 0 Wound Tracing (instead of photographs) []  - 0 Simple Wound Measurement - one wound []  - 0 Complex Wound Measurement - multiple wounds INTERVENTIONS - Wound Dressings X - Small Wound Dressing one or multiple wounds 2 10 []  - 0 Medium Wound Dressing one or multiple wounds []  - 0 Large Wound Dressing one or multiple wounds X- 1 5 Application of Medications - topical []  - 0 Application of Medications - injection INTERVENTIONS - Miscellaneous []  - 0 External ear exam []  - 0 Specimen Collection (cultures, biopsies, blood, body fluids, etc.) []  - 0 Specimen(s) / Culture(s) sent or taken to Lab for analysis []  - 0 Patient Transfer (multiple staff / Civil Service fast streamer / Similar devices) []  - 0 Simple Staple / Suture removal (25 or less) []  - 0 Complex Staple / Suture removal (26 or more) []  - 0 Hypo / Hyperglycemic Management (close monitor of Blood Glucose) []  - 0 Ankle / Brachial Index (ABI) - do not check if billed separately []  - 0 Vital Signs Has the patient been seen at the hospital within the last three years: Yes Total Score: 95 Level Of Care: New/Established - Level 3 Electronic Signature(s) Signed: 01/12/2021 5:48:05 PM By: Baruch Gouty RN, BSN Entered By: Baruch Gouty on 01/12/2021 11:37:41 -------------------------------------------------------------------------------- Encounter Discharge Information Details Patient Name: Date of Service: [name] RD, [name] 01/12/2021 11:15 A M Medical Record Number: [id number] Patient Account Number: [id number] Date of Birth/Sex: Treating RN: 1970-10-21 (50 y.o. [name] Primary Care Cabot Cromartie: [name] Other Clinician: Referring [name]: Treating Lache Dagher/Extender: [name], [name] in Treatment: 18 Encounter Discharge Information Items Discharge Condition: Stable Ambulatory Status: Cane Discharge Destination: Home Transportation: Private Auto Accompanied By: self Schedule Follow-up Appointment: Yes Clinical Summary of Care: Patient Declined Electronic Signature(s) Signed: 01/12/2021 5:48:05 PM By: Baruch Gouty RN, BSN Entered By: Baruch Gouty on 01/12/2021 11:38:56 -------------------------------------------------------------------------------- Pain Assessment Details Patient Name: Date of Service: [name] RD, [name] 01/12/2021 11:15 A M Medical Record Number: [id number] Patient Account Number: [id number] Date of Birth/Sex: Treating RN: 1971/01/21 (50 y.o. [name] Primary Care Ronaldo Crilly: [name] Other Clinician: Referring Kanya Potteiger: Treating Kaityln Kallstrom/Extender: [name], [name] in Treatment: 18 Active Problems Location of Pain Severity and Description of Pain Patient Has Paino Yes Site Locations Pain Location: Pain Location: Pain in Ulcers With Dressing Change: Yes Duration of the Pain. Constant / Intermittento Intermittent Rate the pain. Current Pain Level: 3 Worst Pain Level: 6 Least Pain Level: 0 Character of Pain Describe the Pain: Aching, Tender Pain Management and Medication Current Pain Management: Medication: Yes Is the Current Pain Management Adequate: Adequate Rest: Yes How does your wound impact your activities of daily livingo Sleep: No Bathing: No Appetite: No Relationship With Others: No Bladder Continence: No Emotions: No Bowel Continence: No Work: Yes Toileting: No Drive: No Dressing: No Hobbies: Astronomer) Signed: 01/12/2021 5:48:05 PM By: Baruch Gouty RN, BSN Entered By: Baruch Gouty on 01/12/2021 11:39:45 -------------------------------------------------------------------------------- Patient/Caregiver Education  Details Patient Name: Date of Service: [name] RD, [name] 10/17/2022andnbsp11:15 A M Medical Record Number: [id number] Patient Account Number: [id number] Date of Birth/Gender: Treating RN: April 06, 1970 (50 y.o. [name] Primary Care Physician: [name] Other Clinician: Referring Physician: Treating Physician/Extender: Bobbye Charleston in Treatment: 18 Education Assessment Education Provided To: Patient Education Topics Provided Wound/Skin Impairment: Methods: Explain/Verbal Responses: Reinforcements needed, State content correctly  Electronic Signature(s) Signed: 01/12/2021 5:48:05 PM By: Baruch Gouty RN, BSN Entered By: Baruch Gouty on 01/12/2021 11:38:36 -------------------------------------------------------------------------------- Wound Assessment Details Patient Name: Date of Service: [name] RD, [name] 01/12/2021 11:15 A M Medical Record Number: [id number] Patient Account Number: [id number] Date of Birth/Sex: Treating RN: July 19, 1970 (50 y.o. [name] Primary Care Koral Thaden: [name] Other Clinician: Referring Dariona Postma: Treating Miner Koral/Extender: [name], [name] in Treatment: 18 Wound Status Wound Number: 3 Primary Diabetic Wound/Ulcer of the Lower Extremity Etiology: Wound Location: Right, Proximal, Lateral Foot Wound Open Wounding Event: Gradually Appeared Status: Date Acquired: 10/29/2020 Comorbid Anemia, Coronary Artery Disease, Hypertension, Type II Weeks Of Treatment: 10 History: Diabetes, Osteomyelitis, Neuropathy Clustered Wound: No Wound Measurements Length: (cm) 0.7 Width: (cm) 0.2 Depth: (cm) 1.4 Area: (cm) 0.11 Volume: (cm) 0.154 % Reduction in Area: 12.7% % Reduction in Volume: -75% Epithelialization: None Wound Description Classification: Grade 3 Wound Margin: Well defined, not attached Exudate Amount: Medium Exudate Type: Serosanguineous Exudate Color: red,  brown Foul Odor After Cleansing: No Slough/Fibrino No Wound Bed Granulation Amount: Large (67-100%) Exposed Structure Granulation Quality: Red Fascia Exposed: No Necrotic Amount: None Present (0%) Fat Layer (Subcutaneous Tissue) Exposed: Yes Tendon Exposed: No Muscle Exposed: No Joint Exposed: No Bone Exposed: Yes Treatment Notes Wound #3 (Foot) Wound Laterality: Right, Lateral, Proximal Cleanser Soap and Water Discharge Instruction: May shower and wash wound with dial antibacterial soap and water prior to dressing change. Wound Cleanser Discharge Instruction: Cleanse the wound with wound cleanser prior to applying a clean dressing using gauze sponges, not tissue or cotton balls. Peri-Wound Care Sween Lotion (Moisturizing lotion) Discharge Instruction: Apply moisturizing lotion to foot with dressing changes Topical Primary Dressing Hydrofera Blue Classic Foam, 2x2 in Discharge Instruction: rope Moisten with saline prior to applying to wound bed Secondary Dressing Woven Gauze Sponge, Non-Sterile 4x4 in Discharge Instruction: Apply over primary dressing as directed. ABD Pad, 5x9 Discharge Instruction: Apply over primary dressing as directed. Secured With [employer] 4x5 (in/yd) Discharge Instruction: Secure with Coban as directed. Kerlix Roll Sterile, 4.5x3.1 (in/yd) Discharge Instruction: Secure with Kerlix as directed. 50M Medipore H Soft Cloth Surgical T ape, 2x2 (in/yd) Discharge Instruction: Secure dressing with tape as directed. Compression Wrap Compression Stockings Add-Ons Electronic Signature(s) Signed: 01/12/2021 5:48:05 PM By: Baruch Gouty RN, BSN Entered By: Baruch Gouty on 01/12/2021 11:35:24 -------------------------------------------------------------------------------- Wound Assessment Details Patient Name: Date of Service: [name] RD, [name] 01/12/2021 11:15 A M Medical Record Number: [id number] Patient Account Number:  [id number] Date of Birth/Sex: Treating RN: Dec 18, 1970 (50 y.o. [name] Primary Care Gwendelyn Lanting: [name] Other Clinician: Referring Timoth Schara: Treating Sumedha Munnerlyn/Extender: [name], [name] in Treatment: 18 Wound Status Wound Number: 7 Primary Diabetic Wound/Ulcer of the Lower Extremity Etiology: Wound Location: Right, Lateral, Plantar Foot Wound Open Wounding Event: Gradually Appeared Status: Date Acquired: 12/31/2020 Comorbid Anemia, Coronary Artery Disease, Hypertension, Type II Weeks Of Treatment: 1 History: Diabetes, Osteomyelitis, Neuropathy Clustered Wound: No Wound Measurements Length: (cm) 0.5 Width: (cm) 0.5 Depth: (cm) 1 Area: (cm) 0.196 Volume: (cm) 0.196 % Reduction in Area: 0% % Reduction in Volume: 0% Epithelialization: None Wound Description Classification: Grade 3 Wound Margin: Well defined, not attached Exudate Amount: Medium Exudate Type: Serosanguineous Exudate Color: red, brown Wound Bed Granulation Amount: Large (67-100%) Exposed Structure Granulation Quality: Red Fascia Exposed: No Necrotic Amount: None Present (0%) Fat Layer (Subcutaneous Tissue) Exposed: Yes Tendon Exposed: No Muscle Exposed: No Joint Exposed: No Bone Exposed: Yes Treatment Notes Wound #7 (Foot)  Wound Laterality: Plantar, Right, Lateral Cleanser Soap and Water Discharge Instruction: May shower and wash wound with dial antibacterial soap and water prior to dressing change. Wound Cleanser Discharge Instruction: Cleanse the wound with wound cleanser prior to applying a clean dressing using gauze sponges, not tissue or cotton balls. Peri-Wound Care Sween Lotion (Moisturizing lotion) Discharge Instruction: Apply moisturizing lotion to foot with dressing changes Topical Primary Dressing Hydrofera Blue Classic Foam, 2x2 in Discharge Instruction: rope Moisten with saline prior to applying to wound bed Secondary Dressing Woven Gauze Sponge,  Non-Sterile 4x4 in Discharge Instruction: Apply over primary dressing as directed. ABD Pad, 5x9 Discharge Instruction: Apply over primary dressing as directed. Secured With [employer] 4x5 (in/yd) Discharge Instruction: Secure with Coban as directed. Kerlix Roll Sterile, 4.5x3.1 (in/yd) Discharge Instruction: Secure with Kerlix as directed. 74M Medipore H Soft Cloth Surgical T ape, 2x2 (in/yd) Discharge Instruction: Secure dressing with tape as directed. Compression Wrap Compression Stockings Add-Ons Electronic Signature(s) Signed: 01/12/2021 5:48:05 PM By: Baruch Gouty RN, BSN Entered By: Baruch Gouty on 01/12/2021 11:36:01 -------------------------------------------------------------------------------- Vitals Details Patient Name: Date of Service: [name] RD, [name] 01/12/2021 11:15 A M Medical Record Number: [id number] Patient Account Number: [id number] Date of Birth/Sex: Treating RN: 1970/05/11 (50 y.o. [name] Primary Care Sherice Ijames: [name] Other Clinician: Referring Robinn Overholt: Treating Kateryn Marasigan/Extender: [name], [name] in Treatment: 18 Vital Signs Time Taken: 11:33 Reference Range: 80 - 120 mg / dl Weight (lbs): 188 Notes see HBO encouinter Electronic Signature(s) Signed: 01/12/2021 5:48:05 PM By: Baruch Gouty RN, BSN Entered By: Baruch Gouty on 01/12/2021 11:34:13

## 2021-01-12 NOTE — Progress Notes (Signed)
EVERADO, PILLSBURY ([id number]) Visit Report for 01/12/2021 SuperBill Details Patient Name: Date of Service: [place] RD, [name] 01/12/2021 Medical Record Number: [id number] Patient Account Number: [id number] Date of Birth/Sex: Treating RN: 12/02/1970 (50 y.o. [name] Primary Care Provider: [name] Other Clinician: [name] Referring Provider: Treating Provider/Extender: [name], [name] in Treatment: 18 Diagnosis Coding ICD-10 Codes Code Description [phone] Other chronic osteomyelitis, right ankle and foot E11.621 Type 2 diabetes mellitus with foot ulcer L97.514 Non-pressure chronic ulcer of other part of right foot with necrosis of bone T81.31XS Disruption of external operation (surgical) wound, not elsewhere classified, sequela F17.218 Nicotine dependence, cigarettes, with other nicotine-induced disorders L97.522 Non-pressure chronic ulcer of other part of left foot with fat layer exposed Facility Procedures CPT4 Code Description Modifier Quantity [id number] G0277-(Facility Use Only) HBOT full body chamber, 104min , 4 ICD-10 Diagnosis Description M86.671 Other chronic osteomyelitis, right ankle and foot E11.621 Type 2 diabetes mellitus with foot ulcer Physician Procedures Quantity CPT4 Code Description Modifier [id number] - WC PHYS HYPERBARIC OXYGEN THERAPY 1 ICD-10 Diagnosis Description M86.671 Other chronic osteomyelitis, right ankle and foot E11.621 Type 2 diabetes mellitus with foot ulcer Electronic Signature(s) Signed: 01/12/2021 11:52:54 AM By: [name] EMT Signed: 01/12/2021 5:32:36 PM By: [name] MD Entered By: [name] on 01/12/2021 11:52:53

## 2021-01-12 NOTE — Progress Notes (Signed)
[name], [name] ([id number]) Visit Report for 01/12/2021 SuperBill Details Patient Name: Date of Service: [place] [address], [name] 01/12/2021 Medical Record Number: [id number] Patient Account Number: [id number] Date of Birth/Sex: Treating RN: March 28, 1971 (50 y.o. [name] Primary Care Provider: [name] Other Clinician: Referring Provider: Treating Provider/Extender: [name], [name] in Treatment: 18 Diagnosis Coding ICD-10 Codes Code Description [phone] Other chronic osteomyelitis, right ankle and foot E11.621 Type 2 diabetes mellitus with foot ulcer L97.514 Non-pressure chronic ulcer of other part of right foot with necrosis of bone T81.31XS Disruption of external operation (surgical) wound, not elsewhere classified, sequela F17.218 Nicotine dependence, cigarettes, with other nicotine-induced disorders L97.522 Non-pressure chronic ulcer of other part of left foot with fat layer exposed Facility Procedures CPT4 Code Description Modifier Quantity [id number] 99213 - WOUND CARE VISIT-LEV 3 EST PT 1 Electronic Signature(s) Signed: 01/12/2021 5:32:36 PM By: [name] MD Signed: 01/12/2021 5:48:05 PM By: Baruch Gouty RN, BSN Entered By: Baruch Gouty on 01/12/2021 11:40:04

## 2021-01-12 NOTE — Progress Notes (Addendum)
QUANAH, MAJKA ([id number]) Visit Report for 01/12/2021 Arrival Information Details Patient Name: Date of Service: [address] [place] [address], [name] 01/12/2021 8:00 A M Medical Record Number: [id number] Patient Account Number: [id number] Date of Birth/Sex: Treating RN: 1970-04-26 (50 y.o. [name] Primary Care Diogo Anne: [name] Other Clinician: [name] Referring Lijah Bourque: Treating Lucianne Smestad/Extender: [name], [name] in Treatment: 18 Visit Information History Since Last Visit All ordered tests and consults were completed: Yes Patient Arrived: [name] Added or deleted any medications: No Arrival Time: 08:07 Any new allergies or adverse reactions: No Accompanied By: self Had a fall or experienced change in No Transfer Assistance: None activities of daily living that may affect Patient Identification Verified: Yes risk of falls: Secondary Verification Process Completed: Yes Signs or symptoms of abuse/neglect since last visito No Patient Requires Transmission-Based Precautions: No Hospitalized since last visit: No Patient Has Alerts: No Implantable device outside of the clinic excluding No cellular tissue based products placed in the center since last visit: Has Dressing in Place as Prescribed: Yes Has Compression in Place as Prescribed: Yes Pain Present Now: No Electronic Signature(s) Signed: 01/12/2021 9:38:22 AM By: [name] EMT Entered By: [name] on 01/12/2021 09:38:21 -------------------------------------------------------------------------------- Encounter Discharge Information Details Patient Name: Date of Service: [name], [name] 01/12/2021 8:00 A M Medical Record Number: [id number] Patient Account Number: [id number] Date of Birth/Sex: Treating RN: 03-Jun-1970 (50 y.o. [name] Primary Care Avyn Coate: [name] Other Clinician: [name] Referring Chaeli Judy: Treating Andersyn Fragoso/Extender: [name], [name] in Treatment: 18 Encounter Discharge Information Items Discharge Condition: Stable Ambulatory Status: Cane Discharge Destination: Home Transportation: Private Auto Accompanied By: self Schedule Follow-up Appointment: No Clinical Summary of Care: Electronic Signature(s) Signed: 01/12/2021 11:53:45 AM By: [name] EMT Entered By: [name] on 01/12/2021 11:53:45 -------------------------------------------------------------------------------- Vitals Details Patient Name: Date of Service: [name], [name] 01/12/2021 8:00 A M Medical Record Number: [id number] Patient Account Number: [id number] Date of Birth/Sex: Treating RN: 07-25-1970 (50 y.o. [name] Primary Care Jo-Ann Johanning: [name] Other Clinician: [name] Referring Flossie Wexler: Treating Isabelly Kobler/Extender: [name], [name] in Treatment: 18 Vital Signs Time Taken: 08:22 Temperature (F): 97.6 Weight (lbs): 188 Pulse (bpm): 97 Respiratory Rate (breaths/min): 18 Blood Pressure (mmHg): 132/91 Capillary Blood Glucose (mg/dl): 159 Reference Range: 80 - 120 mg / dl Electronic Signature(s) Signed: 01/12/2021 9:39:45 AM By: [name] EMT Entered By: [name] on 01/12/2021 09:39:45

## 2021-01-13 ENCOUNTER — Inpatient Hospital Stay: Payer: 59 | Attending: Physician Assistant

## 2021-01-13 ENCOUNTER — Other Ambulatory Visit: Payer: Self-pay

## 2021-01-13 ENCOUNTER — Encounter (HOSPITAL_BASED_OUTPATIENT_CLINIC_OR_DEPARTMENT_OTHER): Payer: 59 | Admitting: Internal Medicine

## 2021-01-13 DIAGNOSIS — D649 Anemia, unspecified: Secondary | ICD-10-CM

## 2021-01-13 DIAGNOSIS — D72829 Elevated white blood cell count, unspecified: Secondary | ICD-10-CM | POA: Insufficient documentation

## 2021-01-13 LAB — CMP (CANCER CENTER ONLY)
ALT: 11 U/L (ref 0–44)
AST: 9 U/L — ABNORMAL LOW (ref 15–41)
Albumin: 3.2 g/dL — ABNORMAL LOW (ref 3.5–5.0)
Alkaline Phosphatase: 107 U/L (ref 38–126)
Anion gap: 8 (ref 5–15)
BUN: 26 mg/dL — ABNORMAL HIGH (ref 6–20)
CO2: 22 mmol/L (ref 22–32)
Calcium: 8.9 mg/dL (ref 8.9–10.3)
Chloride: 110 mmol/L (ref 98–111)
Creatinine: 2.79 mg/dL — ABNORMAL HIGH (ref 0.61–1.24)
GFR, Estimated: 27 mL/min — ABNORMAL LOW (ref >60)
Glucose, Bld: 112 mg/dL — ABNORMAL HIGH (ref 70–99)
Potassium: 4.8 mmol/L (ref 3.5–5.1)
Sodium: 140 mmol/L (ref 135–145)
Total Bilirubin: 0.3 mg/dL (ref 0.3–1.2)
Total Protein: 8 g/dL (ref 6.5–8.1)

## 2021-01-13 LAB — CBC WITH DIFFERENTIAL (CANCER CENTER ONLY)
Abs Immature Granulocytes: 0.04 10*3/uL (ref 0.00–0.07)
Basophils Absolute: 0.1 10*3/uL (ref 0.0–0.1)
Basophils Relative: 0 %
Eosinophils Absolute: 1.1 10*3/uL — ABNORMAL HIGH (ref 0.0–0.5)
Eosinophils Relative: 9 %
HCT: 33.3 % — ABNORMAL LOW (ref 39.0–52.0)
Hemoglobin: 10.3 g/dL — ABNORMAL LOW (ref 13.0–17.0)
Immature Granulocytes: 0 %
Lymphocytes Relative: 26 %
Lymphs Abs: 3.2 10*3/uL (ref 0.7–4.0)
MCH: 26.3 pg (ref 26.0–34.0)
MCHC: 30.9 g/dL (ref 30.0–36.0)
MCV: 85.2 fL (ref 80.0–100.0)
Monocytes Absolute: 0.6 10*3/uL (ref 0.1–1.0)
Monocytes Relative: 5 %
Neutro Abs: 7.6 10*3/uL (ref 1.7–7.7)
Neutrophils Relative %: 60 %
Platelet Count: 310 10*3/uL (ref 150–400)
RBC: 3.91 MIL/uL — ABNORMAL LOW (ref 4.22–5.81)
RDW: 16.5 % — ABNORMAL HIGH (ref 11.5–15.5)
WBC Count: 12.6 10*3/uL — ABNORMAL HIGH (ref 4.0–10.5)
nRBC: 0 % (ref 0.0–0.2)

## 2021-01-13 LAB — GLUCOSE, CAPILLARY
Glucose-Capillary: 183 mg/dL — ABNORMAL HIGH (ref 70–99)
Glucose-Capillary: 105 mg/dL — ABNORMAL HIGH (ref 70–99)

## 2021-01-13 LAB — IRON AND TIBC
Iron: 54 ug/dL (ref 42–163)
Saturation Ratios: 20 % (ref 20–55)
TIBC: 269 ug/dL (ref 202–409)
UIBC: 214 ug/dL (ref 117–376)

## 2021-01-13 LAB — FERRITIN: Ferritin: 159 ng/mL (ref 24–336)

## 2021-01-13 LAB — FOLATE: Folate: 8.1 ng/mL (ref >5.9)

## 2021-01-13 NOTE — Progress Notes (Signed)
[name], [name] ([id number]) Visit Report for 01/13/2021 SuperBill Details Patient Name: Date of Service: [place] [address], [name] 01/13/2021 Medical Record Number: [id number] Patient Account Number: [id number] Date of Birth/Sex: Treating RN: 05-Oct-1970 (50 y.o. [name], [name] Primary Care Provider: [name] Other Clinician: [name] Referring Provider: Treating Provider/Extender: [name], [name] in Treatment: 18 Diagnosis Coding ICD-10 Codes Code Description [phone] Other chronic osteomyelitis, right ankle and foot E11.621 Type 2 diabetes mellitus with foot ulcer L97.514 Non-pressure chronic ulcer of other part of right foot with necrosis of bone T81.31XS Disruption of external operation (surgical) wound, not elsewhere classified, sequela F17.218 Nicotine dependence, cigarettes, with other nicotine-induced disorders L97.522 Non-pressure chronic ulcer of other part of left foot with fat layer exposed Facility Procedures CPT4 Code Description Modifier Quantity [id number] G0277-(Facility Use Only) HBOT full body chamber, 75min , 4 ICD-10 Diagnosis Description M86.671 Other chronic osteomyelitis, right ankle and foot E11.621 Type 2 diabetes mellitus with foot ulcer Physician Procedures Quantity CPT4 Code Description Modifier [id number] 21947 - WC PHYS HYPERBARIC OXYGEN THERAPY 1 ICD-10 Diagnosis Description M86.671 Other chronic osteomyelitis, right ankle and foot E11.621 Type 2 diabetes mellitus with foot ulcer Electronic Signature(s) Signed: 01/13/2021 3:28:45 PM By: [name] EMT Signed: 01/13/2021 4:56:56 PM By: [name] MD Entered By: [name] on 01/13/2021 15:28:44

## 2021-01-13 NOTE — Progress Notes (Addendum)
CICERO, NOY ([id number]) Visit Report for 01/13/2021 HBO Details Patient Name: Date of Service: [address], [place] 01/13/2021 1:00 PM Medical Record Number: [id number] Patient Account Number: [id number] Date of Birth/Sex: Treating RN: [birth year] (50 y.o. [name], [name] Primary Care Yarianna Varble: [name] Other Clinician: [name] Referring Suezette Lafave: Treating Reino Lybbert/Extender: [name], [name] in Treatment: 18 HBO Treatment Course Details Treatment Course Number: 1 Ordering Slayden Mennenga: [name] Treatments Ordered: otal 40 HBO Treatment Start Date: 12/08/2020 HBO Indication: Diabetic Ulcer(s) of the Lower Extremity HBO Treatment Details Treatment Number: 14 Patient Type: Outpatient Chamber Type: Monoplace Chamber Serial #: [id number] Treatment Protocol: 2.5 ATA with 90 minutes oxygen, with two 5 minute air breaks Treatment Details Compression Rate Down: 2.0 psi / minute De-Compression Rate Up: 2.0 psi / minute A breaks and breathing ir Compress Tx Pressure periods Decompress Decompress Begins Reached (leave unused spaces Begins Ends blank) Chamber Pressure (ATA 1 2.5 2.5 2.5 2.5 2.5 - - 2.5 1 ) Clock Time (24 hr) 13:11 13:22 13:52 13:57 14:27 14:32 - - 15:02 15:13 Treatment Length: 122 (minutes) Treatment Segments: 4 Vital Signs Capillary Blood Glucose Reference Range: 80 - 120 mg / dl HBO Diabetic Blood Glucose Intervention Range: <131 mg/dl or >249 mg/dl Type: Time Vitals Blood Pulse: Respiratory Temperature: Capillary Blood Glucose Pulse Action Taken: Pressure: Rate: Glucose (mg/dl): Meter #: Oximetry (%) Taken: Pre 13:04 120/74 96 18 97.7 183 Post 15:17 135/92 92 18 97.9 105 discharge per protocol >101 mg/dL Treatment Response Treatment Toleration: Well Treatment Completion Status: Treatment Completed without Adverse Event Additional Procedure Documentation Tissue Sevierity: Limited to breakdown of skin Nickola Lenig  Notes No concerns with treatment given Physician HBO Attestation: I certify that I supervised this HBO treatment in accordance with Medicare guidelines. A trained emergency response team is readily available per Yes hospital policies and procedures. Continue HBOT as ordered. Yes Electronic Signature(s) Signed: 01/13/2021 4:56:56 PM By: [name] MD Previous Signature: 01/13/2021 3:28:21 PM Version By: [name] EMT Entered By: [name] on 01/13/2021 16:56:15 -------------------------------------------------------------------------------- HBO Safety Checklist Details Patient Name: Date of Service: [name], [place] [name] 01/13/2021 1:00 PM Medical Record Number: [id number] Patient Account Number: [id number] Date of Birth/Sex: Treating RN: 08-12-1970 (50 y.o. [name], [name] Primary Care Olumide Dolinger: [name] Other Clinician: [name] Referring Vada Swift: Treating Brian Kocourek/Extender: [name], [name] in Treatment: 18 HBO Safety Checklist Items Safety Checklist Consent Form Signed Patient voided / foley secured and emptied When did you last eato 1130 Last dose of injectable or oral agent 0800 Ostomy pouch emptied and vented if applicable NA All implantable devices assessed, documented and approved NA Intravenous access site secured and place NA Valuables secured Linens and cotton and cotton/polyester blend (less than 51% polyester) Personal oil-based products / skin lotions / body lotions removed Wigs or hairpieces removed NA Smoking or tobacco materials removed Books / newspapers / magazines / loose paper removed Cologne, aftershave, perfume and deodorant removed Jewelry removed (may wrap wedding band) Make-up removed NA Hair care products removed Battery operated devices (external) removed Heating patches and chemical warmers removed Titanium eyewear removed NA Nail polish cured greater than 10 hours NA Casting  material cured greater than 10 hours NA Hearing aids removed NA Loose dentures or partials removed NA Prosthetics have been removed NA Patient demonstrates correct use of air break device (if applicable) Patient concerns have been addressed Patient grounding bracelet on and cord attached to chamber Specifics for Inpatients (complete in addition  to above) Medication sheet sent with patient NA Intravenous medications needed or due during therapy sent with patient NA Drainage tubes (e.g. nasogastric tube or chest tube secured and vented) NA Endotracheal or Tracheotomy tube secured NA Cuff deflated of air and inflated with saline NA Airway suctioned NA Electronic Signature(s) Signed: 01/13/2021 2:26:59 PM By: [name] EMT Entered By: [name] on 01/13/2021 14:26:59

## 2021-01-14 ENCOUNTER — Encounter (HOSPITAL_BASED_OUTPATIENT_CLINIC_OR_DEPARTMENT_OTHER): Payer: 59 | Admitting: Physician Assistant

## 2021-01-15 ENCOUNTER — Encounter (HOSPITAL_BASED_OUTPATIENT_CLINIC_OR_DEPARTMENT_OTHER): Payer: 59 | Admitting: Internal Medicine

## 2021-01-15 ENCOUNTER — Other Ambulatory Visit: Payer: Self-pay

## 2021-01-15 DIAGNOSIS — D72829 Elevated white blood cell count, unspecified: Secondary | ICD-10-CM | POA: Diagnosis not present

## 2021-01-15 LAB — GLUCOSE, CAPILLARY
Glucose-Capillary: 112 mg/dL — ABNORMAL HIGH (ref 70–99)
Glucose-Capillary: 109 mg/dL — ABNORMAL HIGH (ref 70–99)
Glucose-Capillary: 163 mg/dL — ABNORMAL HIGH (ref 70–99)
Glucose-Capillary: 136 mg/dL — ABNORMAL HIGH (ref 70–99)

## 2021-01-15 NOTE — Progress Notes (Addendum)
[name] ([id number]) Visit Report for 01/15/2021 Arrival Information Details Patient Name: Date of Service: [name] Joseph J. 01/15/2021 8:00 A M Medical Record Number: [id number] Patient Account Number: [id number] Date of Birth/Sex: Treating RN: 21-Jul-1970 (50 y.o. [name], [name] Primary Care [name]: [name] Other Clinician: Donavan [name] Referring [name]: Treating [name]/Extender: [name], [name] in Treatment: 18 Visit Information History Since Last Visit All ordered tests and consults were completed: Yes Patient Arrived: Cane Added or deleted any medications: No Arrival Time: 07:47 Any new allergies or adverse reactions: No Accompanied By: self Had a fall or experienced change in No Transfer Assistance: None activities of daily living that may affect Patient Identification Verified: Yes risk of falls: Secondary Verification Process Completed: Yes Signs or symptoms of abuse/neglect since last visito No Patient Requires Transmission-Based Precautions: No Hospitalized since last visit: No Patient Has Alerts: No Implantable device outside of the clinic excluding No cellular tissue based products placed in the center since last visit: Pain Present Now: No Electronic Signature(s) Signed: 01/15/2021 12:53:15 PM By: [name] EMT Entered By: [name] on 01/15/2021 12:53:15 -------------------------------------------------------------------------------- Encounter Discharge Information Details Patient Name: Date of Service: [name] RD, [place]. 01/15/2021 8:00 A M Medical Record Number: [id number] Patient Account Number: [id number] Date of Birth/Sex: Treating RN: October 16, 1970 (50 y.o. [name] Primary Care Anthon Harpole: [name] Other Clinician: Donavan [name] Referring [name]: Treating [name]/Extender: [name], [name] in Treatment: 18 Encounter Discharge Information  Items Discharge Condition: Stable Ambulatory Status: Cane Discharge Destination: Home Transportation: Private Auto Accompanied By: self Schedule Follow-up Appointment: No Clinical Summary of Care: Electronic Signature(s) Signed: 01/15/2021 1:52:50 PM By: [name] EMT Entered By: [name] on 01/15/2021 13:52:50 -------------------------------------------------------------------------------- Vitals Details Patient Name: Date of Service: [name] RD, [place] Joseph J. 01/15/2021 8:00 A M Medical Record Number: [id number] Patient Account Number: [id number] Date of Birth/Sex: Treating RN: 13-Jan-1971 (50 y.o. [name], [name] Primary Care Brenisha Tsui: [name] Other Clinician: Donavan [name] Referring Ariyana Faw: Treating [name]/Extender: [name], [name] in Treatment: 18 Vital Signs Time Taken: 10:12 Temperature (F): 97.9 Weight (lbs): 188 Pulse (bpm): 100 Respiratory Rate (breaths/min): 18 Blood Pressure (mmHg): 135/93 Capillary Blood Glucose (mg/dl): 109 Reference Range: 80 - 120 mg / dl Electronic Signature(s) Signed: 01/15/2021 1:43:14 PM By: [name] EMT Entered By: [name] on 01/15/2021 13:43:14

## 2021-01-15 NOTE — Progress Notes (Addendum)
SUVAN, STCYR ([id number]) Visit Report for 01/13/2021 Arrival Information Details Patient Name: Date of Service: [place] RD, [name] 01/13/2021 1:00 PM Medical Record Number: [id number] Patient Account Number: [id number] Date of Birth/Sex: Treating RN: [birth year] (50 y.o. [name], Meta.Reding Primary Care Alfonza Toft: [name] Other Clinician: [name] Referring Delora Gravatt: Treating Allisson Schindel/Extender: [name], [name] in Treatment: 18 Visit Information History Since Last Visit All ordered tests and consults were completed: Yes Patient Arrived: Cane Added or deleted any medications: No Arrival Time: 12:48 Any new allergies or adverse reactions: No Accompanied By: self Had a fall or experienced change in No Transfer Assistance: None activities of daily living that may affect Patient Identification Verified: Yes risk of falls: Secondary Verification Process Completed: Yes Signs or symptoms of abuse/neglect since last visito No Patient Requires Transmission-Based Precautions: No Hospitalized since last visit: No Patient Has Alerts: No Implantable device outside of the clinic excluding No cellular tissue based products placed in the center since last visit: Pain Present Now: No Electronic Signature(s) Signed: 01/13/2021 2:23:08 PM By: [name] EMT Entered By: [name] on 01/13/2021 14:23:08 -------------------------------------------------------------------------------- Encounter Discharge Information Details Patient Name: Date of Service: [name], [place]. 01/13/2021 1:00 PM Medical Record Number: [id number] Patient Account Number: [id number] Date of Birth/Sex: Treating RN: Jul 30, 1970 (50 y.o. [name] Primary Care Navon Kotowski: [name] Other Clinician: [name] Referring Chelan Heringer: Treating Aubree Doody/Extender: [name], [name] in Treatment: 18 Encounter Discharge Information  Items Discharge Condition: Stable Ambulatory Status: Cane Discharge Destination: Home Transportation: Private Auto Accompanied By: self Schedule Follow-up Appointment: No Clinical Summary of Care: Electronic Signature(s) Signed: 01/13/2021 3:29:13 PM By: [name] EMT Entered By: [name] on 01/13/2021 15:29:13 -------------------------------------------------------------------------------- Bee Details Patient Name: Date of Service: [name], [place] 01/13/2021 1:00 PM Medical Record Number: [id number] Patient Account Number: [id number] Date of Birth/Sex: Treating RN: 1970/11/14 (50 y.o. [name], [name] Primary Care Maayan Jenning: [name] Other Clinician: [name] Referring Keo Schirmer: Treating Lilyana Lippman/Extender: [name], [name] in Treatment: 18 Vital Signs Time Taken: 13:04 Temperature (F): 97.7 Weight (lbs): 188 Pulse (bpm): 96 Respiratory Rate (breaths/min): 18 Blood Pressure (mmHg): 120/74 Capillary Blood Glucose (mg/dl): 183 Reference Range: 80 - 120 mg / dl Electronic Signature(s) Signed: 01/13/2021 2:25:21 PM By: [name] EMT Entered By: [name] on 01/13/2021 14:25:21

## 2021-01-15 NOTE — Progress Notes (Addendum)
[name], [name] ([id number]) Visit Report for 01/15/2021 HBO Details Patient Name: Date of Service: [place] RD, [name] 01/15/2021 8:00 A M Medical Record Number: [id number] Patient Account Number: [id number] Date of Birth/Sex: Treating RN: 1970-07-23 (50 y.o. [name], [name] Primary Care Shota Kohrs: [name] Other Clinician: [name] Referring Marvis Bakken: Treating Shalia Bartko/Extender: [name], [name] in Treatment: 18 HBO Treatment Course Details Treatment Course Number: 1 Ordering Jennalyn Cawley: [name] Treatments Ordered: otal 40 HBO Treatment Start Date: 12/08/2020 HBO Indication: Diabetic Ulcer(s) of the Lower Extremity HBO Treatment Details Treatment Number: 15 Patient Type: Outpatient Chamber Type: Monoplace Chamber Serial #: [id number] Treatment Protocol: 2.5 ATA with 90 minutes oxygen, with two 5 minute air breaks Treatment Details Compression Rate Down: 2.0 psi / minute De-Compression Rate Up: 2.0 psi / minute A breaks and breathing ir Compress Tx Pressure periods Decompress Decompress Begins Reached (leave unused spaces Begins Ends blank) Chamber Pressure (ATA 1 2.5 2.5 2.5 2.5 2.5 - - 2.5 1 ) Clock Time (24 hr) 08:55 09:06 09:36 09:41 10:11 10:16 - - 10:46 10:57 Treatment Length: 122 (minutes) Treatment Segments: 4 Vital Signs Capillary Blood Glucose Reference Range: 80 - 120 mg / dl HBO Diabetic Blood Glucose Intervention Range: <131 mg/dl or >249 mg/dl Time Vitals Blood Respiratory Capillary Blood Glucose Pulse Action Type: Pulse: Temperature: Taken: Pressure: Rate: Glucose (mg/dl): Meter #: Oximetry (%) Taken: Pre 10:12 135/93 100 18 97.9 109 2 Post 11:01 142/94 90 16 97.8 136 2 Treatment Response Treatment Toleration: Well Treatment Completion Status: Treatment Completed without Adverse Event Additional Procedure Documentation Tissue Sevierity: Bone involvement without necrosis Lus Kriegel Notes No concerns with  treatment given Physician HBO Attestation: I certify that I supervised this HBO treatment in accordance with Medicare guidelines. A trained emergency response team is readily available per Yes hospital policies and procedures. Continue HBOT as ordered. Yes Electronic Signature(s) Signed: 01/15/2021 5:12:42 PM By: [name] MD Previous Signature: 01/15/2021 1:51:48 PM Version By: [name] EMT Entered By: [name] on 01/15/2021 16:30:55 -------------------------------------------------------------------------------- HBO Safety Checklist Details Patient Name: Date of Service: [name] RD, [place] [name] 01/15/2021 8:00 A M Medical Record Number: [id number] Patient Account Number: [id number] Date of Birth/Sex: Treating RN: 18-Jun-1970 (50 y.o. [name], [name] Primary Care Princes Finger: [name] Other Clinician: [name] Referring Lorae Roig: Treating Reema Chick/Extender: [name], [name] in Treatment: 18 HBO Safety Checklist Items Safety Checklist Consent Form Signed Patient voided / foley secured and emptied When did you last eato 2300 Yesterday Last dose of injectable or oral agent 0715 Ostomy pouch emptied and vented if applicable NA All implantable devices assessed, documented and approved NA Intravenous access site secured and place NA Valuables secured Linens and cotton and cotton/polyester blend (less than 51% polyester) Personal oil-based products / skin lotions / body lotions removed Wigs or hairpieces removed NA Smoking or tobacco materials removed NA Books / newspapers / magazines / loose paper removed Cologne, aftershave, perfume and deodorant removed Jewelry removed (may wrap wedding band) Make-up removed NA Hair care products removed Battery operated devices (external) removed Heating patches and chemical warmers removed Titanium eyewear removed NA Nail polish cured greater than 10 hours NA Casting material cured  greater than 10 hours NA Hearing aids removed NA Loose dentures or partials removed NA Prosthetics have been removed NA Patient demonstrates correct use of air break device (if applicable) Patient concerns have been addressed Patient grounding bracelet on and cord attached to chamber Specifics for Inpatients (complete in addition  to above) Medication sheet sent with patient NA Intravenous medications needed or due during therapy sent with patient NA Drainage tubes (e.g. nasogastric tube or chest tube secured and vented) NA Endotracheal or Tracheotomy tube secured NA Cuff deflated of air and inflated with saline NA Airway suctioned NA Notes Paper version used prior to treatment. Electronic Signature(s) Signed: 01/15/2021 1:53:22 PM By: [name] EMT Previous Signature: 01/15/2021 1:46:08 PM Version By: [name] EMT Entered By: [name] on 01/15/2021 13:53:22

## 2021-01-15 NOTE — Progress Notes (Signed)
MITUL, HALLOWELL ([id number]) Visit Report for 01/15/2021 Problem List Details Patient Name: Date of Service: [place] RD, [name] 01/15/2021 8:00 A M Medical Record Number: [id number] Patient Account Number: [id number] Date of Birth/Sex: Treating RN: 1970-09-19 (50 y.o. [name], Meta.Reding Primary Care Provider: [name] Other Clinician: [name] Referring Provider: Treating Provider/Extender: [name], [name] in Treatment: 18 Active Problems ICD-10 Encounter Code Description Active Date MDM Diagnosis [phone] Other chronic osteomyelitis, right ankle and foot 09/05/2020 No Yes E11.621 Type 2 diabetes mellitus with foot ulcer 09/05/2020 No Yes L97.514 Non-pressure chronic ulcer of other part of right foot with necrosis of bone 09/05/2020 No Yes T81.31XS Disruption of external operation (surgical) wound, not elsewhere classified, 09/05/2020 No Yes sequela F17.218 Nicotine dependence, cigarettes, with other nicotine-induced disorders 11/26/2020 No Yes L97.522 Non-pressure chronic ulcer of other part of left foot with fat layer exposed 10/22/2020 No Yes Inactive Problems Resolved Problems Electronic Signature(s) Signed: 01/15/2021 5:12:42 PM By: [name] MD Entered By: [name] on 01/15/2021 16:30:14 -------------------------------------------------------------------------------- SuperBill Details Patient Name: Date of Service: [name], [name] 01/15/2021 Medical Record Number: [id number] Patient Account Number: [id number] Date of Birth/Sex: Treating RN: 03/15/1971 (50 y.o. 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 Facility Procedures CPT4 Code: [id number] Description: G0277-(Facility Use Only) HBOT full body chamber, 41min , ICD-10 Diagnosis Description M86.671 Other chronic osteomyelitis, right ankle and foot E11.621 Type 2 diabetes mellitus with foot ulcer Modifier: Quantity: 4 Physician Procedures : CPT4 Code Description Modifier [id number] - WC PHYS HYPERBARIC OXYGEN THERAPY ICD-10 Diagnosis Description M86.671 Other chronic osteomyelitis, right ankle and foot E11.621 Type 2 diabetes mellitus with foot ulcer Quantity: 1 Electronic Signature(s) Signed: 01/15/2021 1:52:14 PM By: [name] EMT Signed: 01/15/2021 5:12:42 PM By: [name] MD Entered By: [name] on 01/15/2021 13:52:13

## 2021-01-16 ENCOUNTER — Encounter (HOSPITAL_BASED_OUTPATIENT_CLINIC_OR_DEPARTMENT_OTHER): Payer: 59 | Admitting: Internal Medicine

## 2021-01-16 DIAGNOSIS — D72829 Elevated white blood cell count, unspecified: Secondary | ICD-10-CM | POA: Diagnosis not present

## 2021-01-16 DIAGNOSIS — E11621 Type 2 diabetes mellitus with foot ulcer: Secondary | ICD-10-CM

## 2021-01-16 DIAGNOSIS — M86671 Other chronic osteomyelitis, right ankle and foot: Secondary | ICD-10-CM

## 2021-01-16 LAB — GLUCOSE, CAPILLARY
Glucose-Capillary: 235 mg/dL — ABNORMAL HIGH (ref 70–99)
Glucose-Capillary: 134 mg/dL — ABNORMAL HIGH (ref 70–99)

## 2021-01-16 NOTE — Progress Notes (Addendum)
DOLPH, TAVANO ([id number]) Visit Report for 01/16/2021 HBO Details Patient Name: Date of Service: [place] RD, [name] 01/16/2021 8:00 A M Medical Record Number: [id number] Patient Account Number: [id number] Date of Birth/Sex: Treating RN: 24-May-1970 (50 y.o. [name] Primary Care Jarelis Ehlert: [name] Other Clinician: [name] Referring Adelisa Satterwhite: Treating Shaune Malacara/Extender: [name], [name] in Treatment: 19 HBO Treatment Course Details Treatment Course Number: 1 Ordering Rosezella Kronick: [name] Treatments Ordered: otal 40 HBO Treatment Start Date: 12/08/2020 HBO Indication: Diabetic Ulcer(s) of the Lower Extremity HBO Treatment Details Treatment Number: 16 Patient Type: Outpatient Chamber Type: Monoplace Chamber Serial #: [id number] Treatment Protocol: 2.5 ATA with 90 minutes oxygen, with two 5 minute air breaks Treatment Details Compression Rate Down: 2.0 psi / minute De-Compression Rate Up: 2.0 psi / minute A breaks and breathing ir Compress Tx Pressure periods Decompress Decompress Begins Reached (leave unused spaces Begins Ends blank) Chamber Pressure (ATA 1 2.5 2.5 2.5 2.5 2.5 - - 2.5 1 ) Clock Time (24 hr) 08:30 08:41 09:11 09:16 09:46 09:51 - - 10:21 10:32 Treatment Length: 122 (minutes) Treatment Segments: 4 Vital Signs Capillary Blood Glucose Reference Range: 80 - 120 mg / dl HBO Diabetic Blood Glucose Intervention Range: <131 mg/dl or >249 mg/dl Time Vitals Blood Respiratory Capillary Blood Glucose Pulse Action Type: Pulse: Temperature: Taken: Pressure: Rate: Glucose (mg/dl): Meter #: Oximetry (%) Taken: Pre 08:20 139/94 99 22 97.8 235 Post 10:36 131/91 95 20 97.5 134 Treatment Response Treatment Toleration: Well Treatment Completion Status: Treatment Completed without Adverse Event Additional Procedure Documentation Tissue Sevierity: Bone involvement without necrosis Physician HBO Attestation: I certify  that I supervised this HBO treatment in accordance with Medicare guidelines. A trained emergency response team is readily available per Yes hospital policies and procedures. Continue HBOT as ordered. Yes Electronic Signature(s) Signed: 01/19/2021 4:28:21 PM By: Kalman Shan DO Previous Signature: 01/16/2021 12:48:56 PM Version By: [name] EMT Entered By: Kalman Shan on 01/19/2021 16:25:21 -------------------------------------------------------------------------------- HBO Safety Checklist Details Patient Name: Date of Service: [name], [place] [name] 01/16/2021 8:00 A M Medical Record Number: [id number] Patient Account Number: [id number] Date of Birth/Sex: Treating RN: 06-Nov-1970 (50 y.o. [name] Primary Care Bashar Milam: [name] Other Clinician: [name] Referring Shelly Shoultz: Treating Ronn Smolinsky/Extender: [name], [name] in Treatment: 19 HBO Safety Checklist Items Safety Checklist Consent Form Signed Patient voided / foley secured and emptied When did you last eato Breakfast Last dose of injectable or oral agent n/a Ostomy pouch emptied and vented if applicable NA All implantable devices assessed, documented and approved NA Intravenous access site secured and place NA Valuables secured Linens and cotton and cotton/polyester blend (less than 51% polyester) Personal oil-based products / skin lotions / body lotions removed Wigs or hairpieces removed NA Smoking or tobacco materials removed NA Books / newspapers / magazines / loose paper removed Cologne, aftershave, perfume and deodorant removed Jewelry removed (may wrap wedding band) Make-up removed NA Hair care products removed NA Battery operated devices (external) removed Heating patches and chemical warmers removed Titanium eyewear removed NA Nail polish cured greater than 10 hours NA Casting material cured greater than 10 hours NA Hearing aids  removed NA Loose dentures or partials removed NA Prosthetics have been removed NA Patient demonstrates correct use of air break device (if applicable) Patient concerns have been addressed Patient grounding bracelet on and cord attached to chamber Specifics for Inpatients (complete in addition to above) Medication sheet sent with patient NA Intravenous  medications needed or due during therapy sent with patient NA Drainage tubes (e.g. nasogastric tube or chest tube secured and vented) NA Endotracheal or Tracheotomy tube secured NA Cuff deflated of air and inflated with saline NA Airway suctioned NA Notes Paper version used prior to treatment. Electronic Signature(s) Signed: 01/16/2021 9:40:35 AM By: [name] EMT Entered By: [name] on 01/16/2021 09:40:35

## 2021-01-16 NOTE — Progress Notes (Addendum)
[name], [name] ([id number]) Visit Report for 01/16/2021 Arrival Information Details Patient Name: Date of Service: [name], [name] Joseph J. 01/16/2021 8:00 A M Medical Record Number: [id number] Patient Account Number: [id number] Date of Birth/Sex: Treating RN: 13-Oct-1970 (50 y.o. [name] Primary Care Oveda Dadamo: [name] Other Clinician: [name] Referring Jannie Doyle: Treating Collette Pescador/Extender: [name], [name] in Treatment: 19 Visit Information History Since Last Visit All ordered tests and consults were completed: Yes Patient Arrived: Cane Added or deleted any medications: No Arrival Time: 07:40 Any new allergies or adverse reactions: No Accompanied By: self Had a fall or experienced change in No Transfer Assistance: None activities of daily living that may affect Patient Identification Verified: Yes risk of falls: Secondary Verification Process Completed: Yes Signs or symptoms of abuse/neglect since last visito No Patient Requires Transmission-Based Precautions: No Hospitalized since last visit: No Patient Has Alerts: No Implantable device outside of the clinic excluding No cellular tissue based products placed in the center since last visit: Pain Present Now: No Electronic Signature(s) Signed: 01/16/2021 9:33:35 AM By: [name] EMT Entered By: [name] on 01/16/2021 09:33:35 -------------------------------------------------------------------------------- Encounter Discharge Information Details Patient Name: Date of Service: [name], [place]. 01/16/2021 8:00 A M Medical Record Number: [id number] Patient Account Number: [id number] Date of Birth/Sex: Treating RN: 05-09-1970 (50 y.o. [name] Primary Care Milca Sytsma: [name] Other Clinician: [name] Referring Eberardo Demello: Treating Jamarria Real/Extender: [name], [name] in Treatment: 19 Encounter Discharge Information  Items Discharge Condition: Stable Ambulatory Status: Cane Discharge Destination: Home Transportation: Private Auto Accompanied By: self Schedule Follow-up Appointment: No Clinical Summary of Care: Electronic Signature(s) Signed: 01/16/2021 12:50:27 PM By: [name] EMT Entered By: [name] on 01/16/2021 12:50:27 -------------------------------------------------------------------------------- Vitals Details Patient Name: Date of Service: [name] RD, [place] Joseph J. 01/16/2021 8:00 A M Medical Record Number: [id number] Patient Account Number: [id number] Date of Birth/Sex: Treating RN: 1971-02-20 (50 y.o. [name] Primary Care Latasha Puskas: [name] Other Clinician: [name] Referring Brick Ketcher: Treating Chonda Baney/Extender: [name], [name] in Treatment: 19 Vital Signs Time Taken: 08:20 Temperature (F): 97.8 Weight (lbs): 188 Pulse (bpm): 99 Respiratory Rate (breaths/min): 22 Blood Pressure (mmHg): 139/94 Capillary Blood Glucose (mg/dl): 235 Reference Range: 80 - 120 mg / dl Electronic Signature(s) Signed: 01/16/2021 9:38:49 AM By: [name] EMT Entered By: [name] on 01/16/2021 09:38:49

## 2021-01-19 ENCOUNTER — Encounter (HOSPITAL_BASED_OUTPATIENT_CLINIC_OR_DEPARTMENT_OTHER): Payer: 59 | Admitting: Internal Medicine

## 2021-01-19 ENCOUNTER — Telehealth: Payer: Self-pay | Admitting: *Deleted

## 2021-01-19 ENCOUNTER — Other Ambulatory Visit: Payer: Self-pay

## 2021-01-19 DIAGNOSIS — E11621 Type 2 diabetes mellitus with foot ulcer: Secondary | ICD-10-CM | POA: Diagnosis not present

## 2021-01-19 DIAGNOSIS — M86671 Other chronic osteomyelitis, right ankle and foot: Secondary | ICD-10-CM

## 2021-01-19 DIAGNOSIS — D72829 Elevated white blood cell count, unspecified: Secondary | ICD-10-CM | POA: Diagnosis not present

## 2021-01-19 LAB — GLUCOSE, CAPILLARY
Glucose-Capillary: 230 mg/dL — ABNORMAL HIGH (ref 70–99)
Glucose-Capillary: 161 mg/dL — ABNORMAL HIGH (ref 70–99)

## 2021-01-19 NOTE — Progress Notes (Addendum)
[name], [name] ([id number]) Visit Report for 01/19/2021 HBO Details Patient Name: Date of Service: [place] [address], [name] 01/19/2021 8:00 A M Medical Record Number: [id number] Patient Account Number: [id number] Date of Birth/Sex: Treating RN: 12/25/1970 (50 y.o. [name] Primary Care Shaneese Tait: [name] Other Clinician: [name] Referring Jisella Ashenfelter: Treating Derward Marple/Extender: [name], [name] in Treatment: 19 HBO Treatment Course Details Treatment Course Number: 1 Ordering Lashuna Tamashiro: [name] Treatments Ordered: otal 40 HBO Treatment Start Date: 12/08/2020 HBO Indication: Diabetic Ulcer(s) of the Lower Extremity HBO Treatment Details Treatment Number: 17 Patient Type: Outpatient Chamber Type: Monoplace Chamber Serial #: [id number] Treatment Protocol: 2.5 ATA with 90 minutes oxygen, with two 5 minute air breaks Treatment Details Compression Rate Down: 2.0 psi / minute De-Compression Rate Up: 2.0 psi / minute A breaks and breathing ir Compress Tx Pressure periods Decompress Decompress Begins Reached (leave unused spaces Begins Ends blank) Chamber Pressure (ATA 1 2.5 2.5 2.5 2.5 2.5 - - 2.5 1 ) Clock Time (24 hr) 08:26 08:36 09:06 09:11 09:41 09:46 - - 10:16 10:26 Treatment Length: 120 (minutes) Treatment Segments: 4 Vital Signs Capillary Blood Glucose Reference Range: 80 - 120 mg / dl HBO Diabetic Blood Glucose Intervention Range: <131 mg/dl or >249 mg/dl Time Vitals Blood Respiratory Capillary Blood Glucose Pulse Action Type: Pulse: Temperature: Taken: Pressure: Rate: Glucose (mg/dl): Meter #: Oximetry (%) Taken: Pre 08:01 133/93 103 18 97.5 230 Post 10:27 151/94 88 18 97.7 161 Treatment Response Treatment Toleration: Well Treatment Completion Status: Treatment Completed without Adverse Event Additional Procedure Documentation Tissue Sevierity: Fat layer exposed Physician HBO Attestation: I certify that I supervised  this HBO treatment in accordance with Medicare guidelines. A trained emergency response team is readily available per Yes hospital policies and procedures. Continue HBOT as ordered. Yes Electronic Signature(s) Signed: 01/19/2021 4:24:08 PM By: Kalman Shan DO Previous Signature: 01/19/2021 11:22:11 AM Version By: [name] EMT Entered By: Kalman Shan on 01/19/2021 16:21:40 -------------------------------------------------------------------------------- HBO Safety Checklist Details Patient Name: Date of Service: [name], [place] [name] 01/19/2021 8:00 A M Medical Record Number: [id number] Patient Account Number: [id number] Date of Birth/Sex: Treating RN: [birth year] (50 y.o. [name] Primary Care Lidiya Reise: [name] Other Clinician: [name] Referring Evelene Roussin: Treating Jeneane Pieczynski/Extender: [name], [name] in Treatment: 19 HBO Safety Checklist Items Safety Checklist Consent Form Signed Patient voided / foley secured and emptied When did you last eato Breakfast Last dose of injectable or oral agent 0730 Ostomy pouch emptied and vented if applicable NA All implantable devices assessed, documented and approved NA Intravenous access site secured and place NA Valuables secured Linens and cotton and cotton/polyester blend (less than 51% polyester) Personal oil-based products / skin lotions / body lotions removed Wigs or hairpieces removed NA Smoking or tobacco materials removed NA Books / newspapers / magazines / loose paper removed Cologne, aftershave, perfume and deodorant removed Jewelry removed (may wrap wedding band) Make-up removed NA Hair care products removed NA Battery operated devices (external) removed Heating patches and chemical warmers removed Titanium eyewear removed NA Nail polish cured greater than 10 hours NA Casting material cured greater than 10 hours NA Hearing aids removed NA Loose dentures  or partials removed NA Prosthetics have been removed NA Patient demonstrates correct use of air break device (if applicable) Patient concerns have been addressed Patient grounding bracelet on and cord attached to chamber Specifics for Inpatients (complete in addition to above) Medication sheet sent with patient NA Intravenous medications  needed or due during therapy sent with patient NA Drainage tubes (e.g. nasogastric tube or chest tube secured and vented) NA Endotracheal or Tracheotomy tube secured NA Cuff deflated of air and inflated with saline NA Airway suctioned NA Electronic Signature(s) Signed: 01/19/2021 11:20:22 AM By: [name] EMT Entered By: [name] on 01/19/2021 11:20:21

## 2021-01-19 NOTE — Progress Notes (Addendum)
[name], [name] ([id number]) Visit Report for 01/19/2021 Arrival Information Details Patient Name: Date of Service: [place] RD, [name] 01/19/2021 8:00 A M Medical Record Number: [id number] Patient Account Number: [id number] Date of Birth/Sex: Treating RN: Nov 17, 1970 (50 y.o. [name] Primary Care [name]: [name] Other Clinician: [name] Referring Vondell Sowell: Treating Jebadiah Imperato/Extender: [name], [name] in Treatment: 19 Visit Information History Since Last Visit All ordered tests and consults were completed: Yes Patient Arrived: Cane Added or deleted any medications: No Arrival Time: 07:44 Any new allergies or adverse reactions: No Accompanied By: self Had a fall or experienced change in No Transfer Assistance: None activities of daily living that may affect Patient Identification Verified: Yes risk of falls: Secondary Verification Process Completed: Yes Signs or symptoms of abuse/neglect since last visito No Patient Requires Transmission-Based Precautions: No Hospitalized since last visit: No Patient Has Alerts: No Implantable device outside of the clinic excluding No cellular tissue based products placed in the center since last visit: Pain Present Now: No Electronic Signature(s) Signed: 01/19/2021 11:17:59 AM By: [name] EMT Entered By: [name] on 01/19/2021 11:17:58 -------------------------------------------------------------------------------- Encounter Discharge Information Details Patient Name: Date of Service: [name], [place]. 01/19/2021 8:00 A M Medical Record Number: [id number] Patient Account Number: [id number] Date of Birth/Sex: Treating RN: 12-10-1970 (49 y.o. [name] Primary Care Lamount Bankson: [name] Other Clinician: [name] Referring Yanil Dawe: Treating Chaniah Cisse/Extender: [name] Weeks in Treatment: 19 Encounter Discharge Information  Items Discharge Condition: Stable Ambulatory Status: Cane Discharge Destination: Other (Note Required) Transportation: Other Accompanied By: staff Schedule Follow-up Appointment: No Clinical Summary of Care: Notes Wound care encounter after treatment. Electronic Signature(s) Signed: 01/19/2021 11:26:01 AM By: [name] EMT Entered By: [name] on 01/19/2021 11:26:01 -------------------------------------------------------------------------------- Vitals Details Patient Name: Date of Service: [name], [name] 01/19/2021 8:00 A M Medical Record Number: [id number] Patient Account Number: [id number] Date of Birth/Sex: Treating RN: 23-Oct-1970 (50 y.o. [name] Primary Care Hoang Reich: [name] Other Clinician: [name] Referring Khianna Blazina: Treating [name] [name]/Extender: [name], [name] in Treatment: 19 Vital Signs Time Taken: 08:01 Temperature (F): 97.5 Weight (lbs): 188 Pulse (bpm): 103 Respiratory Rate (breaths/min): 18 Blood Pressure (mmHg): 133/93 Capillary Blood Glucose (mg/dl): 230 Reference Range: 80 - 120 mg / dl Electronic Signature(s) Signed: 01/19/2021 11:19:15 AM By: [name] EMT Entered By: [name] on 01/19/2021 11:19:14

## 2021-01-19 NOTE — Progress Notes (Signed)
KAWHI, DIEBOLD ([id number]) Visit Report for 01/19/2021 Arrival Information Details Patient Name: Date of Service: [address] RD, [name] 01/19/2021 11:15 A M Medical Record Number: [id number] Patient Account Number: [id number] Date of Birth/Sex: Treating RN: 1970-07-03 (51 y.o. [name], [name] Primary Care Lakresha Stifter: [name] Other Clinician: Referring Dashel Goines: Treating Ashland Osmer/Extender: [name], [name] in Treatment: 19 Visit Information History Since Last Visit Added or deleted any medications: No Patient Arrived: Cane Any new allergies or adverse reactions: No Arrival Time: 10:45 Had a fall or experienced change in No Accompanied By: self activities of daily living that may affect Transfer Assistance: None risk of falls: Patient Identification Verified: Yes Signs or symptoms of abuse/neglect since last visito No Secondary Verification Process Completed: Yes Hospitalized since last visit: No Patient Requires Transmission-Based Precautions: No Implantable device outside of the clinic excluding No Patient Has Alerts: No cellular tissue based products placed in the center since last visit: Has Dressing in Place as Prescribed: Yes Pain Present Now: No Electronic Signature(s) Signed: 01/19/2021 4:57:17 PM By: [name] RN, BSN Entered By: [name] on 01/19/2021 11:02:02 -------------------------------------------------------------------------------- Clinic Level of Care Assessment Details Patient Name: Date of Service: [name], [name] 01/19/2021 11:15 A M Medical Record Number: [id number] Patient Account Number: [id number] Date of Birth/Sex: Treating RN: 1970-11-17 (50 y.o. [name] Primary Care Breaunna Gottlieb: [name] Other Clinician: Referring Aaliayah Miao: Treating Wilda Wetherell/Extender: [name], [name] in Treatment: 19 Clinic Level of Care Assessment Items TOOL 4 Quantity Score X- 1 0 Use when only an  EandM is performed on FOLLOW-UP visit ASSESSMENTS - Nursing Assessment / Reassessment X- 1 10 Reassessment of Co-morbidities (includes updates in patient status) X- 1 5 Reassessment of Adherence to Treatment Plan ASSESSMENTS - Wound and Skin A ssessment / Reassessment []  - 0 Simple Wound Assessment / Reassessment - one wound X- 3 5 Complex Wound Assessment / Reassessment - multiple wounds X- 1 10 Dermatologic / Skin Assessment (not related to wound area) ASSESSMENTS - Focused Assessment []  - 0 Circumferential Edema Measurements - multi extremities X- 1 10 Nutritional Assessment / Counseling / Intervention []  - 0 Lower Extremity Assessment (monofilament, tuning fork, pulses) []  - 0 Peripheral Arterial Disease Assessment (using hand held doppler) ASSESSMENTS - Ostomy and/or Continence Assessment and Care []  - 0 Incontinence Assessment and Management []  - 0 Ostomy Care Assessment and Management (repouching, etc.) PROCESS - Coordination of Care []  - 0 Simple Patient / Family Education for ongoing care X- 1 20 Complex (extensive) Patient / Family Education for ongoing care X- 1 10 Staff obtains Programmer, systems, Records, T Results / Process Orders est []  - 0 Staff telephones HHA, Nursing Homes / Clarify orders / etc []  - 0 Routine Transfer to another Facility (non-emergent condition) []  - 0 Routine Hospital Admission (non-emergent condition) []  - 0 New Admissions / Biomedical engineer / Ordering NPWT Apligraf, etc. , []  - 0 Emergency Hospital Admission (emergent condition) []  - 0 Simple Discharge Coordination X- 1 15 Complex (extensive) Discharge Coordination PROCESS - Special Needs []  - 0 Pediatric / Minor Patient Management []  - 0 Isolation Patient Management []  - 0 Hearing / Language / Visual special needs []  - 0 Assessment of Community assistance (transportation, D/C planning, etc.) []  - 0 Additional assistance / Altered mentation []  - 0 Support Surface(s)  Assessment (bed, cushion, seat, etc.) INTERVENTIONS - Wound Cleansing / Measurement []  - 0 Simple Wound Cleansing - one wound X- 3 5 Complex Wound Cleansing - multiple  wounds []  - 0 Wound Imaging (photographs - any number of wounds) []  - 0 Wound Tracing (instead of photographs) []  - 0 Simple Wound Measurement - one wound X- 3 5 Complex Wound Measurement - multiple wounds INTERVENTIONS - Wound Dressings []  - 0 Small Wound Dressing one or multiple wounds X- 2 15 Medium Wound Dressing one or multiple wounds []  - 0 Large Wound Dressing one or multiple wounds []  - 0 Application of Medications - topical []  - 0 Application of Medications - injection INTERVENTIONS - Miscellaneous []  - 0 External ear exam []  - 0 Specimen Collection (cultures, biopsies, blood, body fluids, etc.) []  - 0 Specimen(s) / Culture(s) sent or taken to Lab for analysis []  - 0 Patient Transfer (multiple staff / Civil Service fast streamer / Similar devices) []  - 0 Simple Staple / Suture removal (25 or less) []  - 0 Complex Staple / Suture removal (26 or more) []  - 0 Hypo / Hyperglycemic Management (close monitor of Blood Glucose) []  - 0 Ankle / Brachial Index (ABI) - do not check if billed separately X- 1 5 Vital Signs Has the patient been seen at the hospital within the last three years: Yes Total Score: 160 Level Of Care: New/Established - Level 5 Electronic Signature(s) Signed: 01/19/2021 4:57:17 PM By: [name] RN, BSN Entered By: [name] on 01/19/2021 11:03:51 -------------------------------------------------------------------------------- Encounter Discharge Information Details Patient Name: Date of Service: [name], [name] 01/19/2021 11:15 A M Medical Record Number: [id number] Patient Account Number: [id number] Date of Birth/Sex: Treating RN: 1970/11/19 (50 y.o. [name] Primary Care Leiland Mihelich: [name] Other Clinician: Referring Shota Kohrs: Treating Estrella Alcaraz/Extender: [name], [name] in Treatment: 19 Encounter Discharge Information Items Discharge Condition: Stable Ambulatory Status: Cane Discharge Destination: Home Transportation: Private Auto Accompanied By: self Schedule Follow-up Appointment: Yes Clinical Summary of Care: Electronic Signature(s) Signed: 01/19/2021 4:57:17 PM By: [name] RN, BSN Entered By: [name] on 01/19/2021 11:03:15 -------------------------------------------------------------------------------- Patient/Caregiver Education Details Patient Name: Date of Service: [name], [name] 10/24/2022andnbsp11:15 A M Medical Record Number: [id number] Patient Account Number: [id number] Date of Birth/Gender: Treating RN: [birth year] (50 y.o. [name] Primary Care Physician: [name] Other Clinician: Referring Physician: Treating Physician/Extender: [name] Weeks in Treatment: 30 Education Assessment Education Provided To: Patient Education Topics Provided Wound/Skin Impairment: Handouts: Skin Care Do's and Dont's Methods: Explain/Verbal Responses: Reinforcements needed Electronic Signature(s) Signed: 01/19/2021 4:57:17 PM By: [name] RN, BSN Entered By: [name] on 01/19/2021 11:03:07 -------------------------------------------------------------------------------- Wound Assessment Details Patient Name: Date of Service: [name], [place] 01/19/2021 11:15 A M Medical Record Number: [id number] Patient Account Number: [id number] Date of Birth/Sex: Treating RN: Jul 14, 1970 (50 y.o. [name] Primary Care Alexus Galka: [name] Other Clinician: Referring Harrietta Incorvaia: Treating Larrie Lucia/Extender: [name], [name] in Treatment: 19 Wound Status Wound Number: 3 Primary Etiology: Diabetic Wound/Ulcer of the Lower Extremity Wound Location: Right, Proximal, Lateral Foot Wound Status: Open Wounding Event: Gradually Appeared Date  Acquired: 10/29/2020 Weeks Of Treatment: 11 Clustered Wound: No Wound Measurements Length: (cm) 0.7 Width: (cm) 0.2 Depth: (cm) 1.4 Area: (cm) 0.11 Volume: (cm) 0.154 % Reduction in Area: 12.7% % Reduction in Volume: -75% Wound Description Classification: Grade 3 Exudate Amount: Medium Exudate Type: Serosanguineous Exudate Color: red, brown Treatment Notes Wound #3 (Foot) Wound Laterality: Right, Lateral, Proximal Cleanser Soap and Water Discharge Instruction: May shower and wash wound with dial antibacterial soap and water prior to dressing change. Wound Cleanser Discharge Instruction: Cleanse the  wound with wound cleanser prior to applying a clean dressing using gauze sponges, not tissue or cotton balls. Peri-Wound Care Sween Lotion (Moisturizing lotion) Discharge Instruction: Apply moisturizing lotion to foot with dressing changes Topical Primary Dressing Hydrofera Blue Classic Foam, 2x2 in Discharge Instruction: rope Moisten with saline prior to applying to wound bed Secondary Dressing Woven Gauze Sponge, Non-Sterile 4x4 in Discharge Instruction: Apply over primary dressing as directed. ABD Pad, 5x9 Discharge Instruction: Apply over primary dressing as directed. Secured With [employer] 4x5 (in/yd) Discharge Instruction: Secure with Coban as directed. Kerlix Roll Sterile, 4.5x3.1 (in/yd) Discharge Instruction: Secure with Kerlix as directed. 4M Medipore H Soft Cloth Surgical T ape, 2x2 (in/yd) Discharge Instruction: Secure dressing with tape as directed. Compression Wrap Compression Stockings Add-Ons Electronic Signature(s) Signed: 01/19/2021 4:57:17 PM By: [name] RN, BSN Entered By: [name] on 01/19/2021 11:02:34 -------------------------------------------------------------------------------- Wound Assessment Details Patient Name: Date of Service: [name], [name] 01/19/2021 11:15 A M Medical Record Number: [id number] Patient  Account Number: [id number] Date of Birth/Sex: Treating RN: 1970-11-20 (50 y.o. [name] Primary Care Raynesha Tiedt: [name] Other Clinician: Referring Terique Kawabata: Treating Laramie Meissner/Extender: [name], [name] in Treatment: 19 Wound Status Wound Number: 6 Primary Etiology: Diabetic Wound/Ulcer of the Lower Extremity Wound Location: Left, Plantar Foot Wound Status: Open Wounding Event: Gradually Appeared Date Acquired: 12/03/2020 Weeks Of Treatment: 6 Clustered Wound: No Wound Measurements Length: (cm) 0.1 Width: (cm) 0.1 Depth: (cm) 0.1 Area: (cm) 0.008 Volume: (cm) 0.001 % Reduction in Area: 98.4% % Reduction in Volume: 99.5% Wound Description Classification: Grade 1 Exudate Amount: None Present Treatment Notes Wound #6 (Foot) Wound Laterality: Plantar, Left Cleanser Soap and Water Discharge Instruction: May shower and wash wound with dial antibacterial soap and water prior to dressing change. Wound Cleanser Discharge Instruction: Cleanse the wound with wound cleanser prior to applying a clean dressing using gauze sponges, not tissue or cotton balls. Peri-Wound Care Sween Lotion (Moisturizing lotion) Discharge Instruction: Apply moisturizing lotion to foot with dressing changes Topical Primary Dressing Hydrofera Blue Classic Foam, 2x2 in Discharge Instruction: Moisten with saline prior to applying to wound bed Secondary Dressing Woven Gauze Sponge, Non-Sterile 4x4 in Discharge Instruction: Apply over primary dressing as directed. Optifoam Non-Adhesive Dressing, 4x4 in Discharge Instruction: Apply over primary dressing cut to make foam donut Secured With Coban Self-Adherent Wrap 4x5 (in/yd) Discharge Instruction: Secure with Coban as directed. Kerlix Roll Sterile, 4.5x3.1 (in/yd) Discharge Instruction: Secure with Kerlix as directed. 4M Medipore H Soft Cloth Surgical T ape, 2x2 (in/yd) Discharge Instruction: Secure dressing with tape as  directed. Compression Wrap Compression Stockings Add-Ons Electronic Signature(s) Signed: 01/19/2021 4:57:17 PM By: [name] RN, BSN Entered By: [name] on 01/19/2021 11:02:34 -------------------------------------------------------------------------------- Wound Assessment Details Patient Name: Date of Service: [name], [name] 01/19/2021 11:15 A M Medical Record Number: [id number] Patient Account Number: [id number] Date of Birth/Sex: Treating RN: 15-Jun-1970 (50 y.o. [name] Primary Care Lamaya Hyneman: [name] Other Clinician: Referring Nasean Zapf: Treating Skii Cleland/Extender: [name], [name] in Treatment: 19 Wound Status Wound Number: 7 Primary Etiology: Diabetic Wound/Ulcer of the Lower Extremity Wound Location: Right, Lateral, Plantar Foot Wound Status: Open Wounding Event: Gradually Appeared Date Acquired: 12/31/2020 Weeks Of Treatment: 2 Clustered Wound: No Wound Measurements Length: (cm) 0.5 Width: (cm) 0.5 Depth: (cm) 1 Area: (cm) 0.196 Volume: (cm) 0.196 % Reduction in Area: 0% % Reduction in Volume: 0% Wound Description Classification: Grade 3 Exudate Amount: Medium Exudate Type: Serosanguineous Exudate Color: red,  brown Treatment Notes Wound #7 (Foot) Wound Laterality: Plantar, Right, Lateral Cleanser Soap and Water Discharge Instruction: May shower and wash wound with dial antibacterial soap and water prior to dressing change. Wound Cleanser Discharge Instruction: Cleanse the wound with wound cleanser prior to applying a clean dressing using gauze sponges, not tissue or cotton balls. Peri-Wound Care Sween Lotion (Moisturizing lotion) Discharge Instruction: Apply moisturizing lotion to foot with dressing changes Topical Primary Dressing Hydrofera Blue Classic Foam, 2x2 in Discharge Instruction: rope Moisten with saline prior to applying to wound bed Secondary Dressing Woven Gauze Sponge, Non-Sterile 4x4  in Discharge Instruction: Apply over primary dressing as directed. ABD Pad, 5x9 Discharge Instruction: Apply over primary dressing as directed. Secured With [employer] 4x5 (in/yd) Discharge Instruction: Secure with Coban as directed. Kerlix Roll Sterile, 4.5x3.1 (in/yd) Discharge Instruction: Secure with Kerlix as directed. 65M Medipore H Soft Cloth Surgical T ape, 2x2 (in/yd) Discharge Instruction: Secure dressing with tape as directed. Compression Wrap Compression Stockings Add-Ons Electronic Signature(s) Signed: 01/19/2021 4:57:17 PM By: [name] RN, BSN Entered By: [name] on 01/19/2021 11:02:34 -------------------------------------------------------------------------------- Vitals Details Patient Name: Date of Service: [name], [name] 01/19/2021 11:15 A M Medical Record Number: [id number] Patient Account Number: [id number] Date of Birth/Sex: Treating RN: Oct 10, 1970 (50 y.o. [name] Primary Care Chilton Sallade: [name] Other Clinician: [name] Referring Bailynn Dyk: Treating Caroleen Stoermer/Extender: [name], [name] in Treatment: 19 Vital Signs Time Taken: 10:45 Temperature (F): 97.7 Weight (lbs): 188 Pulse (bpm): 88 Respiratory Rate (breaths/min): 18 Blood Pressure (mmHg): 151/94 Capillary Blood Glucose (mg/dl): 161 Reference Range: 80 - 120 mg / dl Notes See post Hyberbaric vital signs. Electronic Signature(s) Signed: 01/19/2021 11:16:11 AM By: [name] EMT Entered By: [name] on 01/19/2021 11:16:10

## 2021-01-19 NOTE — Progress Notes (Signed)
SHAARAV, RIPPLE ([id number]) Visit Report for 01/19/2021 SuperBill Details Patient Name: Date of Service: [place] RD, [name] 01/19/2021 Medical Record Number: [id number] Patient Account Number: [id number] Date of Birth/Sex: Treating RN: 1970-06-16 (50 y.o. [name] Primary Care Provider: [name] Other Clinician: [name] Referring Provider: Treating Provider/Extender: [name], [name] in Treatment: 19 Diagnosis Coding ICD-10 Codes Code Description [phone] Other chronic osteomyelitis, right ankle and foot E11.621 Type 2 diabetes mellitus with foot ulcer L97.514 Non-pressure chronic ulcer of other part of right foot with necrosis of bone T81.31XS Disruption of external operation (surgical) wound, not elsewhere classified, sequela F17.218 Nicotine dependence, cigarettes, with other nicotine-induced disorders L97.522 Non-pressure chronic ulcer of other part of left foot with fat layer exposed Facility Procedures CPT4 Code Description Modifier Quantity [id number] G0277-(Facility Use Only) HBOT full body chamber, 56min , 4 ICD-10 Diagnosis Description M86.671 Other chronic osteomyelitis, right ankle and foot E11.621 Type 2 diabetes mellitus with foot ulcer Physician Procedures Quantity CPT4 Code Description Modifier [id number] 46568 - WC PHYS HYPERBARIC OXYGEN THERAPY 1 ICD-10 Diagnosis Description M86.671 Other chronic osteomyelitis, right ankle and foot E11.621 Type 2 diabetes mellitus with foot ulcer Electronic Signature(s) Signed: 01/19/2021 11:25:22 AM By: [name] EMT Signed: 01/19/2021 4:24:08 PM By: Kalman Shan DO Entered By: [name] on 01/19/2021 11:25:21

## 2021-01-19 NOTE — Progress Notes (Signed)
[name], [name] ([id number]) Visit Report for 01/19/2021 SuperBill Details Patient Name: Date of Service: [place] RD, [name] 01/19/2021 Medical Record Number: [id number] Patient Account Number: [id number] Date of Birth/Sex: Treating RN: 03-18-1971 (50 y.o. [name] Primary Care Provider: [name] Other Clinician: Referring Provider: Treating Provider/Extender: [name], [name] in Treatment: 19 Diagnosis Coding ICD-10 Codes Code Description [phone] Other chronic osteomyelitis, right ankle and foot E11.621 Type 2 diabetes mellitus with foot ulcer L97.514 Non-pressure chronic ulcer of other part of right foot with necrosis of bone T81.31XS Disruption of external operation (surgical) wound, not elsewhere classified, sequela F17.218 Nicotine dependence, cigarettes, with other nicotine-induced disorders L97.522 Non-pressure chronic ulcer of other part of left foot with fat layer exposed Facility Procedures CPT4 Code Description Modifier Quantity [id number] 99215 - WOUND CARE VISIT-LEV 5 EST PT 1 Electronic Signature(s) Signed: 01/19/2021 4:24:08 PM By: Kalman Shan DO Signed: 01/19/2021 4:57:17 PM By: [name] RN, BSN Entered By: [name] on 01/19/2021 11:03:57

## 2021-01-19 NOTE — Telephone Encounter (Signed)
-----   Message from Lincoln Brigham, PA-C sent at 01/19/2021  2:22 PM EDT ----- Please call patient and let him know that labs are fairly stable. Anemia likely coming from CKD and chronic wound. Continue to monitor for now.  ----- Message ----- From: Interface, [facility] In [place] Sent: 01/13/2021   9:50 AM EDT To: Lincoln Brigham, PA-C

## 2021-01-19 NOTE — Telephone Encounter (Signed)
Notified of message below

## 2021-01-19 NOTE — Progress Notes (Signed)
[name], [name] ([id number]) Visit Report for 01/16/2021 SuperBill Details Patient Name: Date of Service: [place] RD, [name] 01/16/2021 Medical Record Number: [id number] Patient Account Number: [id number] Date of Birth/Sex: Treating RN: 06-Apr-1970 (50 y.o. [name] Primary Care Provider: [name] Other Clinician: [name] Referring Provider: Treating Provider/Extender: [name], [name] in Treatment: 19 Diagnosis Coding ICD-10 Codes Code Description [phone] Other chronic osteomyelitis, right ankle and foot E11.621 Type 2 diabetes mellitus with foot ulcer L97.514 Non-pressure chronic ulcer of other part of right foot with necrosis of bone T81.31XS Disruption of external operation (surgical) wound, not elsewhere classified, sequela F17.218 Nicotine dependence, cigarettes, with other nicotine-induced disorders L97.522 Non-pressure chronic ulcer of other part of left foot with fat layer exposed Facility Procedures CPT4 Code Description Modifier Quantity [id number] G0277-(Facility Use Only) HBOT full body chamber, 37min , 4 ICD-10 Diagnosis Description M86.671 Other chronic osteomyelitis, right ankle and foot E11.621 Type 2 diabetes mellitus with foot ulcer Physician Procedures Quantity CPT4 Code Description Modifier [id number] 44967 - WC PHYS HYPERBARIC OXYGEN THERAPY 1 ICD-10 Diagnosis Description M86.671 Other chronic osteomyelitis, right ankle and foot E11.621 Type 2 diabetes mellitus with foot ulcer Electronic Signature(s) Signed: 01/16/2021 12:49:44 PM By: [name] EMT Signed: 01/19/2021 4:28:21 PM By: Kalman Shan DO Entered By: [name] on 01/16/2021 12:49:43

## 2021-01-20 ENCOUNTER — Encounter (HOSPITAL_BASED_OUTPATIENT_CLINIC_OR_DEPARTMENT_OTHER): Payer: 59 | Admitting: Internal Medicine

## 2021-01-20 DIAGNOSIS — D72829 Elevated white blood cell count, unspecified: Secondary | ICD-10-CM | POA: Diagnosis not present

## 2021-01-20 LAB — GLUCOSE, CAPILLARY
Glucose-Capillary: 172 mg/dL — ABNORMAL HIGH (ref 70–99)
Glucose-Capillary: 122 mg/dL — ABNORMAL HIGH (ref 70–99)

## 2021-01-20 NOTE — Progress Notes (Signed)
CALDWELL, KRONENBERGER ([id number]) Visit Report for 01/20/2021 Arrival Information Details Patient Name: Date of Service: [place] RD, [name] 01/20/2021 8:00 A M Medical Record Number: [id number] Patient Account Number: [id number] Date of Birth/Sex: Treating RN: 10-20-1970 (50 y.o. [name] Primary Care Lundon Verdejo: [name] Other Clinician: Referring Skyelar Halliday: Treating Antionetta Ator/Extender: [name], [name] in Treatment: 19 Visit Information History Since Last Visit Added or deleted any medications: No Patient Arrived: [name] Any new allergies or adverse reactions: No Arrival Time: 07:50 Had a fall or experienced change in No Accompanied By: alone activities of daily living that may affect Transfer Assistance: None risk of falls: Patient Identification Verified: Yes Signs or symptoms of abuse/neglect since last visito No Secondary Verification Process Completed: Yes Hospitalized since last visit: No Patient Requires Transmission-Based Precautions: No Pain Present Now: No Patient Has Alerts: No Electronic Signature(s) Signed: 01/20/2021 2:42:07 PM By: Maye Hides Entered By: Maye Hides on 01/20/2021 14:42:07 -------------------------------------------------------------------------------- [place] Details Patient Name: Date of Service: [name], [place]. 01/20/2021 8:00 A M Medical Record Number: [id number] Patient Account Number: [id number] Date of Birth/Sex: Treating RN: 1970-07-28 (50 y.o. [name] Primary Care Edyn Popoca: [name] Other Clinician: Referring Ayeden Gladman: Treating Shakeda Pearse/Extender: [name], [name] in Treatment: 19 Vital Signs Time Taken: 07:50 Temperature (F): 98.0 Weight (lbs): 188 Pulse (bpm): 104 Respiratory Rate (breaths/min): 16 Blood Pressure (mmHg): 167/90 Capillary Blood Glucose (mg/dl): 172 Reference Range: 80 - 120 mg / dl Electronic Signature(s) Signed: 01/20/2021 2:42:15 PM  By: Maye Hides Entered By: Maye Hides on 01/20/2021 14:42:15

## 2021-01-20 NOTE — Progress Notes (Signed)
[name], [name] ([id number]) Visit Report for 01/20/2021 HBO Safety Checklist Details Patient Name: Date of Service: [address], [name] 01/20/2021 8:00 A M Medical Record Number: [id number] Patient Account Number: [id number] Date of Birth/Sex: Treating RN: May 30, 1970 (50 y.o. [name] Primary Care Loren Vicens: [name] Other Clinician: Referring Johaan Ryser: Treating Ayane Delancey/Extender: [name], [name] in Treatment: 19 HBO Safety Checklist Items Safety Checklist Consent Form Signed Patient voided / foley secured and emptied When did you last eato 0700 - ham sandwich Last dose of injectable or oral agent 0700 Ostomy pouch emptied and vented if applicable NA All implantable devices assessed, documented and approved NA Intravenous access site secured and place NA Valuables secured Linens and cotton and cotton/polyester blend (less than 51% polyester) Personal oil-based products / skin lotions / body lotions removed Wigs or hairpieces removed Smoking or tobacco materials removed Books / newspapers / magazines / loose paper removed Cologne, aftershave, perfume and deodorant removed Jewelry removed (may wrap wedding band) Make-up removed NA Hair care products removed Battery operated devices (external) removed Heating patches and chemical warmers removed Titanium eyewear removed Nail polish cured greater than 10 hours NA Casting material cured greater than 10 hours NA Hearing aids removed NA Loose dentures or partials removed NA Prosthetics have been removed NA Patient demonstrates correct use of air break device (if applicable) Patient concerns have been addressed Patient grounding bracelet on and cord attached to chamber Specifics for Inpatients (complete in addition to above) Medication sheet sent with patient NA Intravenous medications needed or due during therapy sent with patient NA Drainage tubes (e.g. nasogastric tube or chest  tube secured and vented) NA Endotracheal or Tracheotomy tube secured NA Cuff deflated of air and inflated with saline NA Airway suctioned NA Electronic Signature(s) Signed: 01/20/2021 2:42:22 PM By: Maye Hides Entered By: Maye Hides on 01/20/2021 14:42:22

## 2021-01-21 ENCOUNTER — Encounter (HOSPITAL_BASED_OUTPATIENT_CLINIC_OR_DEPARTMENT_OTHER): Payer: 59 | Admitting: Physician Assistant

## 2021-01-21 ENCOUNTER — Other Ambulatory Visit: Payer: Self-pay

## 2021-01-21 DIAGNOSIS — D72829 Elevated white blood cell count, unspecified: Secondary | ICD-10-CM | POA: Diagnosis not present

## 2021-01-21 LAB — GLUCOSE, CAPILLARY
Glucose-Capillary: 148 mg/dL — ABNORMAL HIGH (ref 70–99)
Glucose-Capillary: 118 mg/dL — ABNORMAL HIGH (ref 70–99)

## 2021-01-21 NOTE — Progress Notes (Addendum)
[name] ([id number]) Visit Report for 01/21/2021 HBO Details Patient Name: Date of Service: [address] [address], [name] 01/21/2021 8:00 A M Medical Record Number: [id number] Patient Account Number: [id number] Date of Birth/Sex: Treating RN: 1970/06/03 (50 y.o. [name] Primary Care Mercer Stallworth: [name] Other Clinician: [name] Referring Seydou Hearns: Treating Lewie Deman/Extender: [name], [name] in Treatment: 19 HBO Treatment Course Details Treatment Course Number: 1 Ordering Mady Oubre: [name] Treatments Ordered: otal 40 HBO Treatment Start Date: 12/08/2020 HBO Indication: Diabetic Ulcer(s) of the Lower Extremity HBO Treatment Details Treatment Number: 19 Patient Type: Outpatient Chamber Type: Monoplace Chamber Serial #: [id number] Treatment Protocol: 2.5 ATA with 90 minutes oxygen, with two 5 minute air breaks Treatment Details Compression Rate Down: 2.0 psi / minute De-Compression Rate Up: 2.0 psi / minute A breaks and breathing ir Compress Tx Pressure periods Decompress Decompress Begins Reached (leave unused spaces Begins Ends blank) Chamber Pressure (ATA 1 2.5 2.5 2.5 2.5 2.5 - - 2.5 1 ) Clock Time (24 hr) 08:23 08:34 09:04 09:09 09:39 09:44 - - 10:14 10:25 Treatment Length: 122 (minutes) Treatment Segments: 4 Vital Signs Capillary Blood Glucose Reference Range: 80 - 120 mg / dl HBO Diabetic Blood Glucose Intervention Range: <131 mg/dl or >249 mg/dl Type: Time Vitals Blood Pulse: Respiratory Temperature: Capillary Blood Glucose Pulse Action Taken: Pressure: Rate: Glucose (mg/dl): Meter #: Oximetry (%) Taken: Pre 08:20 130/86 92 18 98.1 148 Post 10:28 147/97 84 18 97.8 118 discharge per protocol >101 mg/dL Treatment Response Treatment Toleration: Well Treatment Completion Status: Treatment Completed without Adverse Event Additional Procedure Documentation Tissue Sevierity: Fat layer exposed Physician HBO  Attestation: I certify that I supervised this HBO treatment in accordance with Medicare guidelines. A trained emergency response team is readily available per Yes hospital policies and procedures. Continue HBOT as ordered. Yes Electronic Signature(s) Signed: 01/21/2021 4:42:41 PM By: Worthy Keeler PA-C Previous Signature: 01/21/2021 12:38:53 PM Version By: [name] [name] , , Entered By: Worthy Keeler on 01/21/2021 16:42:41 -------------------------------------------------------------------------------- HBO Safety Checklist Details Patient Name: Date of Service: [facility] [address], [place] [name] 01/21/2021 8:00 A M Medical Record Number: [id number] Patient Account Number: [id number] Date of Birth/Sex: Treating RN: 1970/12/18 (50 y.o. [name] Primary Care [place] Agent: [name] Other Clinician: [name] Referring Athena Baltz: Treating Ankit Degregorio/Extender: [name], [name] in Treatment: 19 HBO Safety Checklist Items Safety Checklist Consent Form Signed Patient voided / foley secured and emptied When did you last eato Breakfast Last dose of injectable or oral agent 0730 Ostomy pouch emptied and vented if applicable NA All implantable devices assessed, documented and approved NA Intravenous access site secured and place NA Valuables secured Linens and cotton and cotton/polyester blend (less than 51% polyester) Personal oil-based products / skin lotions / body lotions removed Wigs or hairpieces removed NA Smoking or tobacco materials removed NA Books / newspapers / magazines / loose paper removed Cologne, aftershave, perfume and deodorant removed Jewelry removed (may wrap wedding band) Make-up removed NA Hair care products removed Battery operated devices (external) removed Heating patches and chemical warmers removed Titanium eyewear removed NA Nail polish cured greater than 10 hours NA Casting material cured greater than 10  hours NA Hearing aids removed NA Loose dentures or partials removed NA Prosthetics have been removed NA Patient demonstrates correct use of air break device (if applicable) Patient concerns have been addressed Patient grounding bracelet on and cord attached to chamber Specifics for Inpatients (complete  in addition to above) Medication sheet sent with patient NA Intravenous medications needed or due during therapy sent with patient NA Drainage tubes (e.g. nasogastric tube or chest tube secured and vented) NA Endotracheal or Tracheotomy tube secured NA Cuff deflated of air and inflated with saline NA Airway suctioned NA Notes Paper version used prior to treatment Electronic Signature(s) Signed: 01/21/2021 1:01:45 PM By: [name] [name] , , Previous Signature: 01/21/2021 12:37:05 PM Version By: [name] [name] , , Entered By: [name] on 01/21/2021 13:01:45

## 2021-01-21 NOTE — Progress Notes (Signed)
NORTH, ESTERLINE ([id number]) Visit Report for 01/21/2021 Arrival Information Details Patient Name: Date of Service: [place] [address], [name] 01/21/2021 8:00 A M Medical Record Number: [id number] Patient Account Number: [id number] Date of Birth/Sex: Treating RN: 06-Feb-1971 (51 y.o. [name], [name] Primary Care Leonie Amacher: [name] Other Clinician: [name] Referring Caleen Taaffe: Treating Romon Devereux/Extender: [name], [name] in Treatment: 19 Visit Information History Since Last Visit All ordered tests and consults were completed: Yes Patient Arrived: Cane Added or deleted any medications: No Arrival Time: 07:38 Any new allergies or adverse reactions: No Accompanied By: self Had a fall or experienced change in No Transfer Assistance: None activities of daily living that may affect Patient Identification Verified: Yes risk of falls: Secondary Verification Process Completed: Yes Signs or symptoms of abuse/neglect since last visito No Patient Requires Transmission-Based Precautions: No Hospitalized since last visit: No Patient Has Alerts: No Implantable device outside of the clinic excluding No cellular tissue based products placed in the center since last visit: Pain Present Now: No Electronic Signature(s) Signed: 01/21/2021 12:04:56 PM By: [name] [name] , , Entered By: [name] on 01/21/2021 12:04:56 -------------------------------------------------------------------------------- Encounter Discharge Information Details Patient Name: Date of Service: [name], [place] [name] 01/21/2021 8:00 A M Medical Record Number: [id number] Patient Account Number: [id number] Date of Birth/Sex: Treating RN: 08-15-1970 (50 y.o. [name] Primary Care Iona Stay: [name] Other Clinician: [name] Referring Sarabelle Genson: Treating Annelies Coyt/Extender: [name], [name] in Treatment: 19 Encounter Discharge  Information Items Discharge Condition: Stable Ambulatory Status: Cane Discharge Destination: Other (Note Required) Transportation: Other Accompanied By: staff Schedule Follow-up Appointment: No Clinical Summary of Care: Notes Wound Care Encounter after treatment. Electronic Signature(s) Signed: 01/21/2021 12:40:17 PM By: [name] [name] , , Entered By: [name] on 01/21/2021 12:40:17 -------------------------------------------------------------------------------- Vitals Details Patient Name: Date of Service: [name], [name] 01/21/2021 8:00 A M Medical Record Number: [id number] Patient Account Number: [id number] Date of Birth/Sex: Treating RN: Mar 07, 1971 (50 y.o. [name] Primary Care Ina Poupard: [name] Other Clinician: [name] Referring Dravin Lance: Treating Maikol Grassia/Extender: [name], [name] in Treatment: 19 Vital Signs Time Taken: 08:20 Temperature (F): 98.1 Weight (lbs): 188 Pulse (bpm): 92 Respiratory Rate (breaths/min): 18 Blood Pressure (mmHg): 130/86 Capillary Blood Glucose (mg/dl): 148 Reference Range: 80 - 120 mg / dl Electronic Signature(s) Signed: 01/21/2021 12:34:28 PM By: [name] [name] , , Entered By: [name] on 01/21/2021 12:34:28

## 2021-01-21 NOTE — Progress Notes (Signed)
JAH, ALARID ([id number]) Visit Report for 01/21/2021 Problem List Details Patient Name: Date of Service: [address], [name] 01/21/2021 8:00 A M Medical Record Number: [id number] Patient Account Number: [id number] Date of Birth/Sex: Treating RN: [birth year] (50 y.o. [name], [name] Primary Care Provider: [name] Other Clinician: [name] Referring Provider: Treating Provider/Extender: [name], [name] in Treatment: 19 Active Problems ICD-10 Encounter Code Description Active Date MDM Diagnosis [phone] Other chronic osteomyelitis, right ankle and foot 09/05/2020 No Yes E11.621 Type 2 diabetes mellitus with foot ulcer 09/05/2020 No Yes L97.514 Non-pressure chronic ulcer of other part of right foot with necrosis of bone 09/05/2020 No Yes T81.31XS Disruption of external operation (surgical) wound, not elsewhere classified, 09/05/2020 No Yes sequela F17.218 Nicotine dependence, cigarettes, with other nicotine-induced disorders 11/26/2020 No Yes L97.522 Non-pressure chronic ulcer of other part of left foot with fat layer exposed 10/22/2020 No Yes Inactive Problems Resolved Problems Electronic Signature(s) Signed: 01/21/2021 4:42:51 PM By: Worthy Keeler PA-C Entered By: Worthy Keeler on 01/21/2021 16:42:51 -------------------------------------------------------------------------------- SuperBill Details Patient Name: Date of Service: [name], [name] 01/21/2021 Medical Record Number: [id number] Patient Account Number: [id number] Date of Birth/Sex: Treating RN: [birth year] (50 y.o. 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 Facility Procedures CPT4 Code: 09628366 Description: G0277-(Facility Use Only) HBOT full body chamber, 67min , ICD-10 Diagnosis Description M86.671 Other chronic osteomyelitis, right ankle and foot E11.621 Type 2 diabetes mellitus with foot ulcer Modifier: Quantity: 4 Physician Procedures : CPT4 Code Description Modifier [id number] - WC PHYS HYPERBARIC OXYGEN THERAPY ICD-10 Diagnosis Description M86.671 Other chronic osteomyelitis, right ankle and foot E11.621 Type 2 diabetes mellitus with foot ulcer Quantity: 1 Electronic Signature(s) Signed: 01/21/2021 4:42:48 PM By: Worthy Keeler PA-C Previous Signature: 01/21/2021 12:39:22 PM Version By: [name] [name] , , Entered By: Worthy Keeler on 01/21/2021 16:42:47

## 2021-01-21 NOTE — Progress Notes (Signed)
RICCI, DIROCCO ([id number]) Visit Report for 01/21/2021 Arrival Information Details Patient Name: Date of Service: [address] RD, [name] 01/21/2021 10:45 A M Medical Record Number: [id number] Patient Account Number: [id number] Date of Birth/Sex: Treating RN: 1971/02/06 (50 y.o. [name] Primary Care Shuntel Fishburn: [name] Other Clinician: Referring Ladena Jacquez: Treating Daemian Gahm/Extender: [name], [name] in Treatment: 19 Visit Information History Since Last Visit Added or deleted any medications: No Patient Arrived: [name] Any new allergies or adverse reactions: No Arrival Time: 10:48 Had a fall or experienced change in No Transfer Assistance: None activities of daily living that may affect Patient Identification Verified: Yes risk of falls: Secondary Verification Process Completed: Yes Signs or symptoms of abuse/neglect since last visito No Patient Requires Transmission-Based Precautions: No Hospitalized since last visit: No Patient Has Alerts: No Implantable device outside of the clinic excluding No cellular tissue based products placed in the center since last visit: Has Dressing in Place as Prescribed: Yes Pain Present Now: Yes Electronic Signature(s) Signed: 01/21/2021 5:19:20 PM By: Lorrin Jackson Entered By: Lorrin Jackson on 01/21/2021 10:48:28 -------------------------------------------------------------------------------- Clinic Level of Care Assessment Details Patient Name: Date of Service: [name], [place]. 01/21/2021 10:45 A M Medical Record Number: [id number] Patient Account Number: [id number] Date of Birth/Sex: Treating RN: [birth year] (50 y.o. [name] Primary Care Havah Ammon: [name] Other Clinician: Referring Zelpha Messing: Treating Aarya Quebedeaux/Extender: [name], [name] in Treatment: 19 Clinic Level of Care Assessment Items TOOL 4 Quantity Score X- 1 0 Use when only an EandM is performed on  FOLLOW-UP visit ASSESSMENTS - Nursing Assessment / Reassessment X- 1 10 Reassessment of Co-morbidities (includes updates in patient status) X- 1 5 Reassessment of Adherence to Treatment Plan ASSESSMENTS - Wound and Skin A ssessment / Reassessment []  - 0 Simple Wound Assessment / Reassessment - one wound X- 3 5 Complex Wound Assessment / Reassessment - multiple wounds []  - 0 Dermatologic / Skin Assessment (not related to wound area) ASSESSMENTS - Focused Assessment []  - 0 Circumferential Edema Measurements - multi extremities []  - 0 Nutritional Assessment / Counseling / Intervention []  - 0 Lower Extremity Assessment (monofilament, tuning fork, pulses) []  - 0 Peripheral Arterial Disease Assessment (using hand held doppler) ASSESSMENTS - Ostomy and/or Continence Assessment and Care []  - 0 Incontinence Assessment and Management []  - 0 Ostomy Care Assessment and Management (repouching, etc.) PROCESS - Coordination of Care []  - 0 Simple Patient / Family Education for ongoing care X- 1 20 Complex (extensive) Patient / Family Education for ongoing care X- 1 10 Staff obtains Programmer, systems, Records, T Results / Process Orders est []  - 0 Staff telephones HHA, Nursing Homes / Clarify orders / etc []  - 0 Routine Transfer to another Facility (non-emergent condition) []  - 0 Routine Hospital Admission (non-emergent condition) []  - 0 New Admissions / Biomedical engineer / Ordering NPWT Apligraf, etc. , []  - 0 Emergency Hospital Admission (emergent condition) []  - 0 Simple Discharge Coordination []  - 0 Complex (extensive) Discharge Coordination PROCESS - Special Needs []  - 0 Pediatric / Minor Patient Management []  - 0 Isolation Patient Management []  - 0 Hearing / Language / Visual special needs []  - 0 Assessment of Community assistance (transportation, D/C planning, etc.) []  - 0 Additional assistance / Altered mentation []  - 0 Support Surface(s) Assessment (bed, cushion,  seat, etc.) INTERVENTIONS - Wound Cleansing / Measurement []  - 0 Simple Wound Cleansing - one wound X- 3 5 Complex Wound Cleansing - multiple wounds X- 1  5 Wound Imaging (photographs - any number of wounds) $RemoveBe'[]'grFtLqqnl$  - 0 Wound Tracing (instead of photographs) $RemoveBeforeD'[]'kvphqqpMwdNlcC$  - 0 Simple Wound Measurement - one wound X- 3 5 Complex Wound Measurement - multiple wounds INTERVENTIONS - Wound Dressings X - Small Wound Dressing one or multiple wounds 1 10 X- 2 15 Medium Wound Dressing one or multiple wounds $RemoveBeforeD'[]'GSoSlIVYKXWGvF$  - 0 Large Wound Dressing one or multiple wounds $RemoveBeforeD'[]'CCLapGcfgKSnao$  - 0 Application of Medications - topical $RemoveB'[]'hnrFpRRf$  - 0 Application of Medications - injection INTERVENTIONS - Miscellaneous $RemoveBeforeD'[]'FByejrEPDlbszE$  - 0 External ear exam $Remove'[]'ueoZEMr$  - 0 Specimen Collection (cultures, biopsies, blood, body fluids, etc.) $RemoveBefor'[]'KYudcYWXjLen$  - 0 Specimen(s) / Culture(s) sent or taken to Lab for analysis $RemoveBefo'[]'aSFzizyjzZL$  - 0 Patient Transfer (multiple staff / Civil Service fast streamer / Similar devices) $RemoveBeforeDE'[]'WpbxzyHCwndYaCK$  - 0 Simple Staple / Suture removal (25 or less) $Remove'[]'kIHXViz$  - 0 Complex Staple / Suture removal (26 or more) $Remove'[]'SUvxJHu$  - 0 Hypo / Hyperglycemic Management (close monitor of Blood Glucose) $RemoveBefore'[]'vZoofFkKyYVRd$  - 0 Ankle / Brachial Index (ABI) - do not check if billed separately X- 1 5 Vital Signs Has the patient been seen at the hospital within the last three years: Yes Total Score: 140 Level Of Care: New/Established - Level 4 Electronic Signature(s) Signed: 01/21/2021 5:19:20 PM By: Lorrin Jackson Entered By: Lorrin Jackson on 01/21/2021 11:46:34 -------------------------------------------------------------------------------- Encounter Discharge Information Details Patient Name: Date of Service: [name], [name] 01/21/2021 10:45 A M Medical Record Number: [id number] Patient Account Number: [id number] Date of Birth/Sex: Treating RN: [birth year] (50 y.o. [name] Primary Care Orrie Lascano: [name] Other Clinician: Referring Austyn Seier: Treating Airiel Oblinger/Extender: [name], [name] in  Treatment: 19 Encounter Discharge Information Items Discharge Condition: Stable Ambulatory Status: Cane Discharge Destination: Home Transportation: Private Auto Schedule Follow-up Appointment: Yes Clinical Summary of Care: Provided on 01/21/2021 Form Type Recipient Paper Patient Patient Electronic Signature(s) Signed: 01/21/2021 5:19:20 PM By: Lorrin Jackson Entered By: Lorrin Jackson on 01/21/2021 11:47:29 -------------------------------------------------------------------------------- Lower Extremity Assessment Details Patient Name: Date of Service: [name], [name] 01/21/2021 10:45 A M Medical Record Number: [id number] Patient Account Number: [id number] Date of Birth/Sex: Treating RN: [birth year] (50 y.o. [name] Primary Care Yalda Herd: [name] Other Clinician: Referring Pallavi Clifton: Treating Elleanor Guyett/Extender: [name], [name] in Treatment: 19 Edema Assessment Assessed: [Left: Yes] [Right: Yes] Edema: [Left: No] [Right: No] Calf Left: Right: Point of Measurement: From Medial Instep 35 cm 33 cm Ankle Left: Right: Point of Measurement: From Medial Instep 22 cm 22 cm Vascular Assessment Pulses: Dorsalis Pedis Palpable: [Left:No] [Right:Yes] Electronic Signature(s) Signed: 01/21/2021 5:19:20 PM By: Lorrin Jackson Entered By: Lorrin Jackson on 01/21/2021 11:03:21 -------------------------------------------------------------------------------- [place] Details Patient Name: Date of Service: [name], [name] 01/21/2021 10:45 A M Medical Record Number: [id number] Patient Account Number: [id number] Date of Birth/Sex: Treating RN: Dec 22, 1970 (50 y.o. [name] Primary Care Jina Olenick: [name] Other Clinician: Referring Zariana Strub: Treating Tomy Khim/Extender: [name], [name] in Treatment: 19 Active Inactive Osteomyelitis Nursing Diagnoses: Infection: osteomyelitis Knowledge deficit  related to disease process and management Goals: Patient/caregiver will verbalize understanding of disease process and disease management Date Initiated: 11/26/2020 Target Resolution Date: 02/24/2021 Goal Status: Active Patient's osteomyelitis will resolve Date Initiated: 11/26/2020 Target Resolution Date: 02/24/2021 Goal Status: Active Interventions: Assess for signs and symptoms of osteomyelitis resolution every visit Provide education on osteomyelitis Screen for HBO Treatment Activities: Systemic antibiotics : 11/26/2020 Notes: Wound/Skin Impairment Nursing Diagnoses: Impaired tissue integrity Goals: Patient/caregiver will verbalize understanding  of skin care regimen Date Initiated: 09/05/2020 Target Resolution Date: 02/24/2021 Goal Status: Active Ulcer/skin breakdown will have a volume reduction of 30% by week 4 Date Initiated: 09/05/2020 Date Inactivated: 10/08/2020 Target Resolution Date: 10/03/2020 Goal Status: Met Interventions: Assess patient/caregiver ability to obtain necessary supplies Assess patient/caregiver ability to perform ulcer/skin care regimen upon admission and as needed Assess ulceration(s) every visit Provide education on ulcer and skin care Treatment Activities: Patient referred to home care : 09/05/2020 Skin care regimen initiated : 09/05/2020 Topical wound management initiated : 09/05/2020 Notes: Electronic Signature(s) Signed: 01/21/2021 5:19:20 PM By: Lorrin Jackson Entered By: Lorrin Jackson on 01/21/2021 10:45:19 -------------------------------------------------------------------------------- Pain Assessment Details Patient Name: Date of Service: [name], [name] 01/21/2021 10:45 A M Medical Record Number: [id number] Patient Account Number: [id number] Date of Birth/Sex: Treating RN: 1971-02-03 (50 y.o. [name] Primary Care Aedan Geimer: [name] Other Clinician: Referring Eligio Angert: Treating Esker Dever/Extender: [name], [name] in Treatment: 19 Active Problems Location of Pain Severity and Description of Pain Patient Has Paino Yes Site Locations Pain Location: Pain in Ulcers With Dressing Change: Yes Duration of the Pain. Constant / Intermittento Intermittent Rate the pain. Current Pain Level: 3 Character of Pain Describe the Pain: Tender Pain Management and Medication Current Pain Management: Medication: Yes Cold Application: No Rest: Yes Massage: No Activity: No T.E.N.S.: No Heat Application: No Leg drop or elevation: No Is the Current Pain Management Adequate: Adequate How does your wound impact your activities of daily livingo Sleep: No Bathing: No Appetite: No Relationship With Others: No Bladder Continence: No Emotions: No Bowel Continence: No Work: No Toileting: No Drive: No Dressing: No Hobbies: No Electronic Signature(s) Signed: 01/21/2021 5:19:20 PM By: Lorrin Jackson Entered By: Lorrin Jackson on 01/21/2021 10:49:00 -------------------------------------------------------------------------------- Patient/Caregiver Education Details Patient Name: Date of Service: [name], [name] 10/26/2022andnbsp10:45 A M Medical Record Number: [id number] Patient Account Number: [id number] Date of Birth/Gender: Treating RN: 1970/07/02 (50 y.o. [name] Primary Care Physician: [name] Other Clinician: Referring Physician: Treating Physician/Extender: [name], [name] in Treatment: 19 Education Assessment Education Provided To: Patient Education Topics Provided Offloading: Methods: Explain/Verbal, Printed Responses: State content correctly Wound/Skin Impairment: Methods: Demonstration, Explain/Verbal, Printed Responses: State content correctly Electronic Signature(s) Signed: 01/21/2021 5:19:20 PM By: Lorrin Jackson Entered By: Lorrin Jackson on 01/21/2021  10:45:40 -------------------------------------------------------------------------------- Wound Assessment Details Patient Name: Date of Service: [name], [name] 01/21/2021 10:45 A M Medical Record Number: [id number] Patient Account Number: [id number] Date of Birth/Sex: Treating RN: 1970/10/16 (50 y.o. [name] Primary Care Gae Bihl: [name] Other Clinician: Referring Lemoyne Scarpati: Treating Emmilia Sowder/Extender: [name], [name] in Treatment: 19 Wound Status Wound Number: 3 Primary Diabetic Wound/Ulcer of the Lower Extremity Etiology: Wound Location: Right, Proximal, Lateral Foot Wound Open Wounding Event: Gradually Appeared Status: Date Acquired: 10/29/2020 Comorbid Anemia, Coronary Artery Disease, Hypertension, Type II Weeks Of Treatment: 12 History: Diabetes, Osteomyelitis, Neuropathy Clustered Wound: No Wound Measurements Length: (cm) 0.9 Width: (cm) 0.3 Depth: (cm) 1.4 Area: (cm) 0.212 Volume: (cm) 0.297 % Reduction in Area: -68.3% % Reduction in Volume: -237.5% Tunneling: Yes Location 1 Position (o'clock): 12 Maximum Distance: (cm) 2.4 Location 2 Position (o'clock): 6 Maximum Distance: (cm) 3.4 Undermining: No Wound Description Classification: Grade 3 Wound Margin: Well defined, not attached Exudate Amount: Medium Exudate Type: Serosanguineous Exudate Color: red, brown Foul Odor After Cleansing: No Slough/Fibrino Yes Wound Bed Granulation Amount: Large (67-100%) Exposed Structure Granulation Quality: Red Fascia Exposed: No Necrotic  Amount: Small (1-33%) Fat Layer (Subcutaneous Tissue) Exposed: Yes Necrotic Quality: Adherent Slough Tendon Exposed: No Muscle Exposed: No Joint Exposed: No Bone Exposed: No Treatment Notes Wound #3 (Foot) Wound Laterality: Right, Lateral, Proximal Cleanser Soap and Water Discharge Instruction: May shower and wash wound with dial antibacterial soap and water prior to dressing  change. Wound Cleanser Discharge Instruction: Cleanse the wound with wound cleanser prior to applying a clean dressing using gauze sponges, not tissue or cotton balls. Peri-Wound Care Sween Lotion (Moisturizing lotion) Discharge Instruction: Apply moisturizing lotion to foot with dressing changes Topical Primary Dressing Hydrofera Blue Classic Foam, 2x2 in Discharge Instruction: rope Moisten with saline prior to applying to wound bed Secondary Dressing Woven Gauze Sponge, Non-Sterile 4x4 in Discharge Instruction: Apply over primary dressing as directed. ABD Pad, 5x9 Discharge Instruction: Apply over primary dressing as directed. Secured With [employer] 4x5 (in/yd) Discharge Instruction: Secure with Coban as directed. Kerlix Roll Sterile, 4.5x3.1 (in/yd) Discharge Instruction: Secure with Kerlix as directed. 63M Medipore H Soft Cloth Surgical T ape, 2x2 (in/yd) Discharge Instruction: Secure dressing with tape as directed. Compression Wrap Compression Stockings Add-Ons Electronic Signature(s) Signed: 01/21/2021 5:19:20 PM By: Lorrin Jackson Entered By: Lorrin Jackson on 01/21/2021 11:00:28 -------------------------------------------------------------------------------- Wound Assessment Details Patient Name: Date of Service: [name], [name] 01/21/2021 10:45 A M Medical Record Number: [id number] Patient Account Number: [id number] Date of Birth/Sex: Treating RN: Sep 28, 1970 (50 y.o. [name] Primary Care Ranger Petrich: Other Clinician: [name] Referring Pailynn Vahey: Treating Tenita Cue/Extender: [name], [name] in Treatment: 19 Wound Status Wound Number: 6 Primary Diabetic Wound/Ulcer of the Lower Extremity Etiology: Wound Location: Left, Plantar Foot Wound Healed - Epithelialized Wounding Event: Gradually Appeared Status: Date Acquired: 12/03/2020 Comorbid Anemia, Coronary Artery Disease, Hypertension, Type II Weeks Of Treatment:  7 History: Diabetes, Osteomyelitis, Neuropathy Clustered Wound: No Wound Measurements Length: (cm) Width: (cm) Depth: (cm) Area: (cm) Volume: (cm) 0 % Reduction in Area: 100% 0 % Reduction in Volume: 100% 0 0 0 Wound Description Classification: Grade 1 Electronic Signature(s) Signed: 01/21/2021 5:19:20 PM By: Lorrin Jackson Entered By: Lorrin Jackson on 01/21/2021 11:28:41 -------------------------------------------------------------------------------- Wound Assessment Details Patient Name: Date of Service: [name], [name] 01/21/2021 10:45 A M Medical Record Number: [id number] Patient Account Number: [id number] Date of Birth/Sex: Treating RN: [birth year] (50 y.o. [name] Primary Care Fathima Bartl: [name] Other Clinician: Referring Terresa Marlett: Treating Lon Klippel/Extender: [name], [name] in Treatment: 19 Wound Status Wound Number: 7 Primary Diabetic Wound/Ulcer of the Lower Extremity Etiology: Wound Location: Right, Lateral, Plantar Foot Wound Open Wounding Event: Gradually Appeared Status: Date Acquired: 12/31/2020 Comorbid Anemia, Coronary Artery Disease, Hypertension, Type II Weeks Of Treatment: 3 History: Diabetes, Osteomyelitis, Neuropathy Clustered Wound: No Wound Measurements Length: (cm) 0.5 Width: (cm) 0.5 Depth: (cm) 1.3 Area: (cm) 0.196 Volume: (cm) 0.255 % Reduction in Area: 0% % Reduction in Volume: -30.1% Tunneling: Yes Position (o'clock): 6 Maximum Distance: (cm) 2 Undermining: No Wound Description Classification: Grade 3 Wound Margin: Well defined, not attached Exudate Amount: Medium Exudate Type: Serosanguineous Exudate Color: red, brown Foul Odor After Cleansing: No Slough/Fibrino Yes Wound Bed Granulation Amount: Large (67-100%) Exposed Structure Granulation Quality: Red Fascia Exposed: No Necrotic Amount: Small (1-33%) Fat Layer (Subcutaneous Tissue) Exposed: Yes Necrotic Quality: Adherent  Slough Tendon Exposed: No Muscle Exposed: No Joint Exposed: No Bone Exposed: No Treatment Notes Wound #7 (Foot) Wound Laterality: Plantar, Right, Lateral Cleanser Soap and Water Discharge Instruction: May shower and wash wound with dial  antibacterial soap and water prior to dressing change. Wound Cleanser Discharge Instruction: Cleanse the wound with wound cleanser prior to applying a clean dressing using gauze sponges, not tissue or cotton balls. Peri-Wound Care Sween Lotion (Moisturizing lotion) Discharge Instruction: Apply moisturizing lotion to foot with dressing changes Topical Primary Dressing Hydrofera Blue Classic Foam, 2x2 in Discharge Instruction: rope Moisten with saline prior to applying to wound bed Secondary Dressing Woven Gauze Sponge, Non-Sterile 4x4 in Discharge Instruction: Apply over primary dressing as directed. ABD Pad, 5x9 Discharge Instruction: Apply over primary dressing as directed. Secured With [employer] 4x5 (in/yd) Discharge Instruction: Secure with Coban as directed. Kerlix Roll Sterile, 4.5x3.1 (in/yd) Discharge Instruction: Secure with Kerlix as directed. 40M Medipore H Soft Cloth Surgical T ape, 2x2 (in/yd) Discharge Instruction: Secure dressing with tape as directed. Compression Wrap Compression Stockings Add-Ons Electronic Signature(s) Signed: 01/21/2021 5:19:20 PM By: Lorrin Jackson Entered By: Lorrin Jackson on 01/21/2021 11:00:13 -------------------------------------------------------------------------------- Wound Assessment Details Patient Name: Date of Service: [name], [name] 01/21/2021 10:45 A M Medical Record Number: [id number] Patient Account Number: [id number] Date of Birth/Sex: Treating RN: [birth year] (50 y.o. [name] Primary Care Conley Delisle: [name] Other Clinician: Referring Aviyana Sonntag: Treating Caressa Scearce/Extender: [name], [name] in Treatment: 19 Wound Status Wound  Number: 8 Primary Trauma, Other Etiology: Wound Location: Right, Anterior Lower Leg Wound Open Wounding Event: Trauma Status: Date Acquired: 01/16/2021 Comorbid Anemia, Coronary Artery Disease, Hypertension, Type II Weeks Of Treatment: 0 History: Diabetes, Osteomyelitis, Neuropathy Clustered Wound: No Wound Measurements Length: (cm) 1.5 Width: (cm) 0.7 Depth: (cm) 0.2 Area: (cm) 0.825 Volume: (cm) 0.165 % Reduction in Area: % Reduction in Volume: Epithelialization: None Tunneling: No Undermining: No Wound Description Classification: Full Thickness Without Exposed Support Structures Wound Margin: Distinct, outline attached Exudate Amount: Medium Exudate Type: Serosanguineous Exudate Color: red, brown Foul Odor After Cleansing: No Slough/Fibrino Yes Wound Bed Granulation Amount: Medium (34-66%) Exposed Structure Granulation Quality: Red Fascia Exposed: No Necrotic Amount: Medium (34-66%) Fat Layer (Subcutaneous Tissue) Exposed: Yes Necrotic Quality: Adherent Slough Tendon Exposed: No Muscle Exposed: No Joint Exposed: No Bone Exposed: No Treatment Notes Wound #8 (Lower Leg) Wound Laterality: Right, Anterior Cleanser Soap and Water Discharge Instruction: May shower and wash wound with dial antibacterial soap and water prior to dressing change. Wound Cleanser Discharge Instruction: Cleanse the wound with wound cleanser prior to applying a clean dressing using gauze sponges, not tissue or cotton balls. Peri-Wound Care Topical Primary Dressing Hydrofera Blue Classic Foam, 2x2 in Discharge Instruction: Moisten with saline prior to applying to wound bed Secondary Dressing Zetuvit Plus Silicone Border Dressing 4x4 (in/in) Discharge Instruction: Apply silicone border over primary dressing as directed. Secured With Compression Wrap Compression Stockings Environmental education officer) Signed: 01/21/2021 5:19:20 PM By: Lorrin Jackson Entered By: Lorrin Jackson on  01/21/2021 11:02:09 -------------------------------------------------------------------------------- Vitals Details Patient Name: Date of Service: [name], [name] 01/21/2021 10:45 A M Medical Record Number: [id number] Patient Account Number: [id number] Date of Birth/Sex: Treating RN: 1970-06-27 (50 y.o. [name] Primary Care Beulah Matusek: [name] Other Clinician: Referring Shelonda Saxe: Treating Azlaan Isidore/Extender: [name], [name] in Treatment: 19 Vital Signs Time Taken: 10:50 Temperature (F): 97.8 Weight (lbs): 188 Pulse (bpm): 85 Respiratory Rate (breaths/min): 18 Blood Pressure (mmHg): 138/81 Capillary Blood Glucose (mg/dl): 118 Reference Range: 80 - 120 mg / dl Electronic Signature(s) Signed: 01/21/2021 5:19:20 PM By: Lorrin Jackson Entered By: Lorrin Jackson on 01/21/2021 10:50:39

## 2021-01-21 NOTE — Progress Notes (Addendum)
[name], [name] ([id number]) Visit Report for 01/21/2021 Chief Complaint Document Details Patient Name: Date of Service: [place] [name], [name] 01/21/2021 10:45 A M Medical Record Number: [id number] Patient Account Number: [id number] Date of Birth/Sex: Treating RN: 1971-01-30 (50 y.o. [name] Primary Care Provider: [name] Other Clinician: Referring Provider: Treating Provider/Extender: [name], [name] in Treatment: 19 Information Obtained from: Patient Chief Complaint 09/05/2020; patient is here for review of the wound extensively on a right TMA amputation site. He also has a wound on the plantar aspect of the left foot Electronic Signature(s) Signed: 01/21/2021 11:00:46 AM By: Worthy Keeler PA-C Entered By: Worthy Keeler on 01/21/2021 11:00:46 -------------------------------------------------------------------------------- HPI Details Patient Name: Date of Service: [name], [name] 01/21/2021 10:45 A M Medical Record Number: [id number] Patient Account Number: [id number] Date of Birth/Sex: Treating RN: [birth year] (50 y.o. [name] Primary Care Provider: [name] Other Clinician: Referring Provider: Treating Provider/Extender: [name], [name] in Treatment: 19 History of Present Illness HPI Description: ADMISSION 09/05/2020 This is a 50 year old man who has type 2 diabetes. Essentially the problem began admitted to [facility] health in December with infection gas gangrene. Cultures ultimately grew staph lugdunensis. 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 March Rummage. Shortly thereafter he developed a nonhealing wound with wound dehiscence noted on 05/09/2020. He had a wound VAC I think for a period of time after the surgery but it did not help. He has undergone an IandD and skin graft on the right foot on 05/16/2020. He underwent a further IandD on 06/04/2020 not debrided to bone. Noted  to have exposed bone with osteomyelitis on 07/29/2020 he underwent an operative debridement with resection of the metatarsal. Culture of the metatarsal grew Enterobacter cloacae I. He has been followed by Dr. [name] of infectious disease. Currently on Levaquin and doxycycline I think at about the 4-week of 6 mark. At the last visit with Dr. March Rummage he was supposed to have use Santyl although I do not think he has it and he has been using I think a Betadine wet-to-dry. He is referred here by Dr. [name] for our review who saw him yesterday. I cannot see that he has had imaging studies of the right foot. No MRI Past medical history includes type 2 diabetes, in 2021 and ulcer of the left foot that ultimately healed with a wound VAC, stage III chronic kidney disease, first toe amputations bilaterally, hypertension, still a 1/2 pack/day smoker. The patient had ABIs on 08/09/2018 which was 1.00 on the right and 0.96 on the left both areas had triphasic waveforms. Not felt to have an arterial issue. 09/12/2020; 1 week follow-up. He has been doing the dressings along with home health. This is a deep punched out area in the incision of his transmit site. We use silver alginate last week to help with drainage and antibacterial properties. He tolerated this well 6/24; the patient was actually at his podiatrist this morning who debrided the wound. We have been using silver alginate. They are going to follow him in 6 weeks. 6/29; patient is using silver alginate. Apparently saw Dr. March Rummage this week although I have not checked his note I will try to do so. He is following up in 6 weeks. This was the original transmetatarsal amputation when I first saw this 3 mid part of his amputation site was deep down to bone however this seems to have progressively close down which  is gratifying to see. He still has a fairly callus uneven surface however spreading this apart its difficult to identify anything that does not look epithelialized.  When I first saw this I thought he would require an extensive debridement perhaps back in the OR by Dr. March Rummage however all of this appears to be looking better and at this point I would continue with the silver alginate see if this holds together over the next several weeks. He does not have an arterial issue 7/13; 2-week follow-up. Unfortunately the area did not hold together there was drainage on his dressing. Still copious amounts of callus. It was difficult to see where this actually was open therefore I elected to go ahead with a vigorous debridement. Still using silver alginate 7/20 I brought him back after extensive callus debridement last week. The only open area was on the medial part of the foot extensive debridement of thick surrounding callus and I was able to do identify the remaining tunneling area. 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 This is the area that we may have been following. This is smaller this week but still surrounded with thick callus He came in today with a large area of callus on the left midfoot. This I had noticed previously. HOWEVER our intake nurse clearly incorrectly documented this as opening. The callus and split there was an odor so all of this had to be removed. 10/29/2020 unfortunately upon evaluation today this patient appears to have significant mount of callus noted at this point. There does not appear to be any signs of active infection systemically which is great although locally he is having some discomfort around the lateral portion of his foot on the right. The left foot is no having a lot of callus I am not certain even has an open wound if there is anything is extremely tiny. 11/05/2020 upon evaluation today patient's wounds actually appear to be doing a bit better compared to last week since we have uncovered some of these areas I think there are some definite improvements. With that  being said there is still quite a bit of tunneling and undermining at many locations that again has me concerned I still think that the MRI is the best thing to do he has that scheduled for Saturday. 11/26/2020 upon evaluation today patient appears to be doing decently well in regard to his foot in general. He did have an MRI I did review this and he had multiple findings on MRI consistent with osteomyelitis across the board in regard pretty much to his remaining metatarsal regions. Nonetheless there was also some evidence of some involvement of the navicular bone as well. Either way I think that this patient actually is in desperate need of get him in for hyperbaric oxygen therapy we discussed that today that will be detailed in the plan. Nonetheless he is also still on the doxycycline which I think is appropriate for the time being as well I did go ahead and place him on that following the results of the MRI based on what we were seeing. I gave him that prescription for 14 days for the time being and we will go from there. 12/03/2020 upon evaluation today patient appears to be doing decently well in regard to his wounds in general in regard to the right foot. With that being said he does have a callus on the left foot in the end there  was actually an area open area here that will need to be addressed. This is small and I think we take care of it now hopefully will heal quite readily. Fortunately I do not see any signs of infection worsening I am getting need to refill the antibiotic for him today. He has been taking doxycycline which seems to be doing well. We will also get a go ahead and get him approved for HBO therapy. 12/31/2020 upon evaluation today patient appears to be doing really roughly the same as compared to last time I saw him. Fortunately there is no evidence of active infection which is great news systemically he has started hyperbarics he seems to be doing decently well he said a couple  times where anxiety kind of got to him but overall I think he is doing awesome. He is no longer on the antibiotic therapy. With that being said I do believe that he is going to need likely some additional antibiotics based on what I am seeing today we can actually obtain a bone culture from the plantar wound that was hiding underneath the callus. He was having pain in this area which is what may be try to clear away the callus to look sure enough there was an actual opening that went deeper into the wound bed region here. 01/21/2021 upon evaluation today patient appears to be doing about the same in regard to his foot in general. He does have some depth to some of the wound areas I really think he would benefit from is trying to see about getting him set up for a gauze VAC. I think that the gauze VAC could be helpful for him and will work much more effectively with what we are seeing as compared to traditional foam wound VAC. The patient is in agreement with giving this a trial. Nonetheless unfortunately think that he overall seems to be doing really well and I think we are headed in the right direction as far as the overall healing is concerned. Electronic Signature(s) Signed: 01/21/2021 1:29:57 PM By: Worthy Keeler PA-C Entered By: Worthy Keeler on 01/21/2021 13:29:56 -------------------------------------------------------------------------------- Physical Exam Details Patient Name: Date of Service: Joseph RD, [name] 01/21/2021 10:45 A M Medical Record Number: [id number] Patient Account Number: [id number] Date of Birth/Sex: Treating RN: 08-07-1970 (50 y.o. [name] Primary Care Provider: [name] Other Clinician: Referring Provider: Treating Provider/Extender: [name], [name] in Treatment: 56 Constitutional Well-nourished and well-hydrated in no acute distress. Respiratory normal breathing without difficulty. Psychiatric this patient is able to  make decisions and demonstrates good insight into disease process. Alert and Oriented x 3. pleasant and cooperative. Notes Upon inspection patient's wound bed showed signs of doing about the same although it appears clean it does bleed and is very friable. Nonetheless I think that a gauze VAC would be the ideal thing to do here. The patient is in agreement with giving this a trial and I think we get approval and get it in place we can change this twice a week here in the clinic. Electronic Signature(s) Signed: 01/21/2021 1:30:18 PM By: Worthy Keeler PA-C Entered By: Worthy Keeler on 01/21/2021 13:30:18 -------------------------------------------------------------------------------- Physician Orders Details Patient Name: Date of Service: [name], [place] [name] 01/21/2021 10:45 A M Medical Record Number: [id number] Patient Account Number: [id number] Date of Birth/Sex: Treating RN: 1971/02/06 (50 y.o. [name] Primary Care Provider: [name] Other Clinician: Referring Provider: Treating Provider/Extender: Worthy Keeler  Minette Brine, [name] in Treatment: 19 Verbal / Phone Orders: No Diagnosis Coding ICD-10 Coding Code Description [phone] Other chronic osteomyelitis, right ankle and foot E11.621 Type 2 diabetes mellitus with foot ulcer L97.514 Non-pressure chronic ulcer of other part of right foot with necrosis of bone T81.31XS Disruption of external operation (surgical) wound, not elsewhere classified, sequela F17.218 Nicotine dependence, cigarettes, with other nicotine-induced disorders L97.522 Non-pressure chronic ulcer of other part of left foot with fat layer exposed Follow-up Appointments ppointment in 1 week. - with Margarita Grizzle Return A Bathing/ Shower/ Hygiene May shower with protection but do not get wound dressing(s) wet. Negative Presssure Wound Therapy Wound Vac to wound continuously at 135mm/hg pressure - Will order Gauze Vac from [employer] Gauze  Packing Edema Control - Lymphedema / SCD / Other Elevate legs to the level of the heart or above for 30 minutes daily and/or when sitting, a frequency of: Avoid standing for long periods of time. Moisturize legs daily. - to foot with dressing changes Off-Loading Open toe surgical shoe to: - Wear at all times except driving. on right foot Other: - may wear shoe with memory foam insert to left foot Additional Orders / Instructions Stop/Decrease Smoking Follow Nutritious Diet Non Wound Condition Other Non Wound Condition Orders/Instructions: - cushion bottom of left foot with orthopedic felt or foam [place] to [place] for wound care. May utilize formulary equivalent dressing for wound treatment orders unless otherwise specified. Dressing changes to be completed by [place] on Monday / Wednesday / Friday except when patient has scheduled visit at [facility]. Hyperbaric Oxygen Therapy Indication: - wagner grade 3 diabetic foot ulcer of right foot If appropriate for treatment, begin HBOT per protocol: 2.5 ATA for 90 Minutes with 2 Five (5) Minute A Breaks ir Total Number of Treatments: - 40 One treatments per day (delivered Monday through Friday unless otherwise specified in Special Instructions below): Finger stick Blood Glucose Pre- and Post- HBOT Treatment. Follow Hyperbaric Oxygen Glycemia Protocol A frin (Oxymetazoline HCL) 0.05% nasal spray - 1 spray in both nostrils daily as needed prior to HBO treatment for difficulty clearing ears Wound Treatment Wound #3 - Foot Wound Laterality: Right, Lateral, Proximal Cleanser: Soap and Water 1 x Per Week/30 Days Discharge Instructions: May shower and wash wound with dial antibacterial soap and water prior to dressing change. Cleanser: Wound Cleanser 1 x Per Week/30 Days Discharge Instructions: Cleanse the wound with wound cleanser prior to applying a clean dressing using gauze sponges, not tissue or cotton  balls. Peri-Wound Care: Sween Lotion (Moisturizing lotion) 1 x Per Week/30 Days Discharge Instructions: Apply moisturizing lotion to foot with dressing changes Prim Dressing: Hydrofera Blue Classic Foam, 2x2 in 1 x Per Week/30 Days ary Discharge Instructions: rope Moisten with saline prior to applying to wound bed Secondary Dressing: Woven Gauze Sponge, Non-Sterile 4x4 in (Generic) 1 x Per Week/30 Days Discharge Instructions: Apply over primary dressing as directed. Secondary Dressing: ABD Pad, 5x9 1 x Per Week/30 Days Discharge Instructions: Apply over primary dressing as directed. Secured With: Coban Self-Adherent Wrap 4x5 (in/yd) (Generic) 1 x Per Week/30 Days Discharge Instructions: Secure with Coban as directed. Secured With: [employer], 4.5x3.1 (in/yd) (Generic) 1 x Per Week/30 Days Discharge Instructions: Secure with Kerlix as directed. Secured With: 23M Medipore H Soft Cloth Surgical Tape, 2x2 (in/yd) (Generic) 1 x Per Week/30 Days Discharge Instructions: Secure dressing with tape as directed. Wound #7 - Foot Wound Laterality: Plantar, Right, Lateral Cleanser: Soap and Water  1 x Per Week/30 Days Discharge Instructions: May shower and wash wound with dial antibacterial soap and water prior to dressing change. Cleanser: Wound Cleanser 1 x Per Week/30 Days Discharge Instructions: Cleanse the wound with wound cleanser prior to applying a clean dressing using gauze sponges, not tissue or cotton balls. Peri-Wound Care: Sween Lotion (Moisturizing lotion) 1 x Per Week/30 Days Discharge Instructions: Apply moisturizing lotion to foot with dressing changes Prim Dressing: Hydrofera Blue Classic Foam, 2x2 in 1 x Per Week/30 Days ary Discharge Instructions: rope Moisten with saline prior to applying to wound bed Secondary Dressing: Woven Gauze Sponge, Non-Sterile 4x4 in (Generic) 1 x Per Week/30 Days Discharge Instructions: Apply over primary dressing as directed. Secondary Dressing:  ABD Pad, 5x9 1 x Per Week/30 Days Discharge Instructions: Apply over primary dressing as directed. Secured With: Coban Self-Adherent Wrap 4x5 (in/yd) (Generic) 1 x Per Week/30 Days Discharge Instructions: Secure with Coban as directed. Secured With: [employer], 4.5x3.1 (in/yd) (Generic) 1 x Per Week/30 Days Discharge Instructions: Secure with Kerlix as directed. Secured With: 85M Medipore H Soft Cloth Surgical Tape, 2x2 (in/yd) (Generic) 1 x Per Week/30 Days Discharge Instructions: Secure dressing with tape as directed. Wound #8 - Lower Leg Wound Laterality: Right, Anterior Cleanser: Soap and Water 1 x Per Week/30 Days Discharge Instructions: May shower and wash wound with dial antibacterial soap and water prior to dressing change. Cleanser: Wound Cleanser 1 x Per Week/30 Days Discharge Instructions: Cleanse the wound with wound cleanser prior to applying a clean dressing using gauze sponges, not tissue or cotton balls. Prim Dressing: Hydrofera Blue Classic Foam, 2x2 in 1 x Per Week/30 Days ary Discharge Instructions: Moisten with saline prior to applying to wound bed Secondary Dressing: Zetuvit Plus Silicone Border Dressing 4x4 (in/in) 1 x Per Week/30 Days Discharge Instructions: Apply silicone border over primary dressing as directed. GLYCEMIA INTERVENTIONS PROTOCOL PRE-HBO GLYCEMIA INTERVENTIONS ACTION INTERVENTION Obtain pre-HBO capillary blood glucose (ensure 1 physician order is in chart). A. Notify HBO physician and await physician orders. 2 If result is 70 mg/dl or below: B. If the result meets the hospital definition of a critical result, follow hospital policy. A. Give patient an 8 ounce Glucerna Shake, an 8 ounce Ensure, or 8 ounces of a Glucerna/Ensure equivalent dietary supplement*. B. Wait 30 minutes. If result is 71 mg/dl to 130 mg/dl: C. Retest patients capillary blood glucose (CBG). D. If result greater than or equal to 110 mg/dl, proceed with HBO. If  result less than 110 mg/dl, notify HBO physician and consider holding HBO. If result is 131 mg/dl to 249 mg/dl: A. Proceed with HBO. A. Notify HBO physician and await physician orders. B. It is recommended to hold HBO and do If result is 250 mg/dl or greater: blood/urine ketone testing. C. If the result meets the hospital definition of a critical result, follow hospital policy. POST-HBO GLYCEMIA INTERVENTIONS ACTION INTERVENTION Obtain post HBO capillary blood glucose (ensure 1 physician order is in chart). A. Notify HBO physician and await physician orders. 2 If result is 70 mg/dl or below: B. If the result meets the hospital definition of a critical result, follow hospital policy. A. Give patient an 8 ounce Glucerna Shake, an 8 ounce Ensure, or 8 ounces of a Glucerna/Ensure equivalent dietary supplement*. B. Wait 15 minutes for symptoms of If result is 71 mg/dl to 100 mg/dl: hypoglycemia (i.e. nervousness, anxiety, sweating, chills, clamminess, irritability, confusion, tachycardia or dizziness). C. If patient asymptomatic, discharge patient. If patient symptomatic, repeat capillary blood  glucose (CBG) and notify HBO physician. If result is 101 mg/dl to 249 mg/dl: A. Discharge patient. A. Notify HBO physician and await physician orders. B. It is recommended to do blood/urine ketone If result is 250 mg/dl or greater: testing. C. If the result meets the hospital definition of a critical result, follow hospital policy. *Juice or candies are NOT equivalent products. If patient refuses the Glucerna or Ensure, please consult the hospital dietitian for an appropriate substitute. Electronic Signature(s) Signed: 01/21/2021 4:55:06 PM By: Worthy Keeler PA-C Signed: 01/21/2021 5:19:20 PM By: Lorrin Jackson Entered By: Lorrin Jackson on 01/21/2021 11:31:42 -------------------------------------------------------------------------------- Problem List Details Patient  Name: Date of Service: Joseph Hahn RD, [name] 01/21/2021 10:45 A M Medical Record Number: [id number] Patient Account Number: [id number] Date of Birth/Sex: Treating RN: 03-05-1971 (50 y.o. [name] Primary Care Provider: [name] Other Clinician: Referring Provider: Treating Provider/Extender: [name], [name] in Treatment: 19 Active Problems ICD-10 Encounter Code Description Active Date MDM Diagnosis [phone] Other chronic osteomyelitis, right ankle and foot 09/05/2020 No Yes E11.621 Type 2 diabetes mellitus with foot ulcer 09/05/2020 No Yes L97.514 Non-pressure chronic ulcer of other part of right foot with necrosis of bone 09/05/2020 No Yes T81.31XS Disruption of external operation (surgical) wound, not elsewhere classified, 09/05/2020 No Yes sequela F17.218 Nicotine dependence, cigarettes, with other nicotine-induced disorders 11/26/2020 No Yes L97.522 Non-pressure chronic ulcer of other part of left foot with fat layer exposed 10/22/2020 No Yes Inactive Problems Resolved Problems Electronic Signature(s) Signed: 01/21/2021 11:00:36 AM By: Worthy Keeler PA-C Entered By: Worthy Keeler on 01/21/2021 11:00:36 -------------------------------------------------------------------------------- Progress Note Details Patient Name: Date of Service: [name], [place] [name] 01/21/2021 10:45 A M Medical Record Number: [id number] Patient Account Number: [id number] Date of Birth/Sex: Treating RN: 09-Aug-1970 (49 y.o. [name] Primary Care Provider: [name] Other Clinician: Referring Provider: Treating Provider/Extender: [name], [name] in Treatment: 19 Subjective Chief Complaint Information obtained from Patient 09/05/2020; patient is here for review of the wound extensively on a right TMA amputation site. He also has a wound on the plantar aspect of the left foot History of Present Illness (HPI) ADMISSION 09/05/2020 This is a  50 year old man who has type 2 diabetes. Essentially the problem began admitted to [facility] health in December with infection gas gangrene. Cultures ultimately grew staph lugdunensis. 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 March Rummage. Shortly thereafter he developed a nonhealing wound with wound dehiscence noted on 05/09/2020. He had a wound VAC I think for a period of time after the surgery but it did not help. He has undergone an IandD and skin graft on the right foot on 05/16/2020. He underwent a further IandD on 06/04/2020 not debrided to bone. 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 Culture of the metatarsal grew Enterobacter cloacae I. He has been followed by Dr. [name] of infectious disease. Currently on Levaquin and doxycycline I think at about the 4-week of 6 mark. At the last visit with Dr. March Rummage he was supposed to have use Santyl although I do not think he has it and he has been using I think a Betadine wet-to-dry. He is referred here by Dr. [name] for our review who saw him yesterday. I cannot see that he has had imaging studies of the right foot. No MRI Past medical history includes type 2 diabetes, in 2021 and ulcer  of the left foot that ultimately healed with a wound VAC, stage III chronic kidney disease, first toe amputations bilaterally, hypertension, still a 1/2 pack/day smoker. The patient had ABIs on 08/09/2018 which was 1.00 on the right and 0.96 on the left both areas had triphasic waveforms. Not felt to have an arterial issue. 09/12/2020; 1 week follow-up. He has been doing the dressings along with home health. This is a deep punched out area in the incision of his transmit site. We use silver alginate last week to help with drainage and antibacterial properties. He tolerated this well 6/24; the patient was actually at his podiatrist this morning who debrided the wound. We have  been using silver alginate. They are going to follow him in 6 weeks. 6/29; patient is using silver alginate. Apparently saw Dr. March Rummage this week although I have not checked his note I will try to do so. He is following up in 6 weeks. This was the original transmetatarsal amputation when I first saw this 3 mid part of his amputation site was deep down to bone however this seems to have progressively close down which is gratifying to see. He still has a fairly callus uneven surface however spreading this apart its difficult to identify anything that does not look epithelialized. When I first saw this I thought he would require an extensive debridement perhaps back in the OR by Dr. March Rummage however all of this appears to be looking better and at this point I would continue with the silver alginate see if this holds together over the next several weeks. He does not have an arterial issue 7/13; 2-week follow-up. Unfortunately the area did not hold together there was drainage on his dressing. Still copious amounts of callus. It was difficult to see where this actually was open therefore I elected to go ahead with a vigorous debridement. Still using silver alginate 7/20 I brought him back after extensive callus debridement last week. The only open area was on the medial part of the foot extensive debridement of thick surrounding callus and I was able to do identify the remaining tunneling area. 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 This is the area that we may have been following. This is smaller this week but still surrounded with thick callus He came in today with a large area of callus on the left midfoot. This I had noticed previously. HOWEVER our intake nurse clearly incorrectly documented this as opening. The callus and split there was an odor so all of this had to be removed. 10/29/2020 unfortunately upon evaluation today this patient  appears to have significant mount of callus noted at this point. There does not appear to be any signs of active infection systemically which is great although locally he is having some discomfort around the lateral portion of his foot on the right. The left foot is no having a lot of callus I am not certain even has an open wound if there is anything is extremely tiny. 11/05/2020 upon evaluation today patient's wounds actually appear to be doing a bit better compared to last week since we have uncovered some of these areas I think there are some definite improvements. With that being said there is still quite a bit of tunneling and undermining at many locations that again has me concerned I still think that the MRI is the best thing to do he has that scheduled for Saturday. 11/26/2020  upon evaluation today patient appears to be doing decently well in regard to his foot in general. He did have an MRI I did review this and he had multiple findings on MRI consistent with osteomyelitis across the board in regard pretty much to his remaining metatarsal regions. Nonetheless there was also some evidence of some involvement of the navicular bone as well. Either way I think that this patient actually is in desperate need of get him in for hyperbaric oxygen therapy we discussed that today that will be detailed in the plan. Nonetheless he is also still on the doxycycline which I think is appropriate for the time being as well I did go ahead and place him on that following the results of the MRI based on what we were seeing. I gave him that prescription for 14 days for the time being and we will go from there. 12/03/2020 upon evaluation today patient appears to be doing decently well in regard to his wounds in general in regard to the right foot. With that being said he does have a callus on the left foot in the end there was actually an area open area here that will need to be addressed. This is small and I think we  take care of it now hopefully will heal quite readily. Fortunately I do not see any signs of infection worsening I am getting need to refill the antibiotic for him today. He has been taking doxycycline which seems to be doing well. We will also get a go ahead and get him approved for HBO therapy. 12/31/2020 upon evaluation today patient appears to be doing really roughly the same as compared to last time I saw him. Fortunately there is no evidence of active infection which is great news systemically he has started hyperbarics he seems to be doing decently well he said a couple times where anxiety kind of got to him but overall I think he is doing awesome. He is no longer on the antibiotic therapy. With that being said I do believe that he is going to need likely some additional antibiotics based on what I am seeing today we can actually obtain a bone culture from the plantar wound that was hiding underneath the callus. He was having pain in this area which is what may be try to clear away the callus to look sure enough there was an actual opening that went deeper into the wound bed region here. 01/21/2021 upon evaluation today patient appears to be doing about the same in regard to his foot in general. He does have some depth to some of the wound areas I really think he would benefit from is trying to see about getting him set up for a gauze VAC. I think that the gauze VAC could be helpful for him and will work much more effectively with what we are seeing as compared to traditional foam wound VAC. The patient is in agreement with giving this a trial. Nonetheless unfortunately think that he overall seems to be doing really well and I think we are headed in the right direction as far as the overall healing is concerned. Objective Constitutional Well-nourished and well-hydrated in no acute distress. Vitals Time Taken: 10:50 AM, Weight: 188 lbs, Temperature: 97.8 F, Pulse: 85 bpm, Respiratory Rate: 18  breaths/min, Blood Pressure: 138/81 mmHg, Capillary Blood Glucose: 118 mg/dl. Respiratory normal breathing without difficulty. Psychiatric this patient is able to make decisions and demonstrates good insight into disease process. Alert and Oriented x  3. pleasant and cooperative. General Notes: Upon inspection patient's wound bed showed signs of doing about the same although it appears clean it does bleed and is very friable. Nonetheless I think that a gauze VAC would be the ideal thing to do here. The patient is in agreement with giving this a trial and I think we get approval and get it in place we can change this twice a week here in the clinic. Integumentary (Hair, Skin) Wound #3 status is Open. Original cause of wound was Gradually Appeared. The date acquired was: 10/29/2020. The wound has been in treatment 12 weeks. The wound is located on the Right,Proximal,Lateral Foot. The wound measures 0.9cm length x 0.3cm width x 1.4cm depth; 0.212cm^2 area and 0.297cm^3 volume. There is Fat Layer (Subcutaneous Tissue) exposed. There is no undermining noted, however, there is tunneling at 12:00 with a maximum distance of 2.4cm. There is additional tunneling and at 6:00 with a maximum distance of 3.4cm. There is a medium amount of serosanguineous drainage noted. The wound margin is well defined and not attached to the wound base. There is large (67-100%) red granulation within the wound bed. There is a small (1-33%) amount of necrotic tissue within the wound bed including Adherent Slough. Wound #6 status is Healed - Epithelialized. Original cause of wound was Gradually Appeared. The date acquired was: 12/03/2020. The wound has been in treatment 7 weeks. The wound is located on the [place]. The wound measures 0cm length x 0cm width x 0cm depth; 0cm^2 area and 0cm^3 volume. Wound #7 status is Open. Original cause of wound was Gradually Appeared. The date acquired was: 12/31/2020. The wound has been in  treatment 3 weeks. The wound is located on the [place]. The wound measures 0.5cm length x 0.5cm width x 1.3cm depth; 0.196cm^2 area and 0.255cm^3 volume. There is Fat Layer (Subcutaneous Tissue) exposed. There is no undermining noted, however, there is tunneling at 6:00 with a maximum distance of 2cm. There is a medium amount of serosanguineous drainage noted. The wound margin is well defined and not attached to the wound base. There is large (67-100%) red granulation within the wound bed. There is a small (1-33%) amount of necrotic tissue within the wound bed including Adherent Slough. Wound #8 status is Open. Original cause of wound was Trauma. The date acquired was: 01/16/2021. The wound is located on the Right,Anterior Lower Leg. The wound measures 1.5cm length x 0.7cm width x 0.2cm depth; 0.825cm^2 area and 0.165cm^3 volume. There is Fat Layer (Subcutaneous Tissue) exposed. There is no tunneling or undermining noted. There is a medium amount of serosanguineous drainage noted. The wound margin is distinct with the outline attached to the wound base. There is medium (34-66%) red granulation within the wound bed. There is a medium (34-66%) amount of necrotic tissue within the wound bed including Adherent Slough. Assessment Active Problems ICD-10 Other chronic osteomyelitis, right ankle and foot Type 2 diabetes mellitus with foot ulcer Non-pressure chronic ulcer of other part of right foot with necrosis of bone Disruption of external operation (surgical) wound, not elsewhere classified, sequela Nicotine dependence, cigarettes, with other nicotine-induced disorders Non-pressure chronic ulcer of other part of left foot with fat layer exposed Plan Follow-up Appointments: Return Appointment in 1 week. - with [facility] Octave Shower/ Hygiene: May shower with protection but do not get wound dressing(s) wet. Negative Presssure Wound Therapy: Wound Vac to wound continuously at  143mm/hg pressure - Will order Gauze Vac from [employer] Ster[employer] Gauze Packing Edema Control -  Lymphedema / SCD / Other: Elevate legs to the level of the heart or above for 30 minutes daily and/or when sitting, a frequency of: Avoid standing for long periods of time. Moisturize legs daily. - to foot with dressing changes Off-Loading: Open toe surgical shoe to: - Wear at all times except driving. on right foot Other: - may wear shoe with memory foam insert to left foot Additional Orders / Instructions: Stop/Decrease Smoking Follow Nutritious Diet Non Wound Condition: Other Non Wound Condition Orders/Instructions: - cushion bottom of left foot with orthopedic felt or foam Home Health: Admit to [facility] for wound care. May utilize formulary equivalent dressing for wound treatment orders unless otherwise specified. Dressing changes to be completed by [place] on Monday / Wednesday / Friday except when patient has scheduled visit at [facility]. Hyperbaric Oxygen Therapy: Indication: - wagner grade 3 diabetic foot ulcer of right foot If appropriate for treatment, begin HBOT per protocol: 2.5 ATA for 90 Minutes with 2 Five (5) Minute Air Breaks T Number of Treatments: - 40 otal One treatments per day (delivered Monday through Friday unless otherwise specified in Special Instructions below): Finger stick Blood Glucose Pre- and Post- HBOT Treatment. Follow Hyperbaric Oxygen Glycemia Protocol Afrin (Oxymetazoline HCL) 0.05% nasal spray - 1 spray in both nostrils daily as needed prior to HBO treatment for difficulty clearing ears WOUND #3: - Foot Wound Laterality: Right, Lateral, Proximal Cleanser: Soap and Water 1 x Per Week/30 Days Discharge Instructions: May shower and wash wound with dial antibacterial soap and water prior to dressing change. Cleanser: Wound Cleanser 1 x Per Week/30 Days Discharge Instructions: Cleanse the wound with wound cleanser prior to applying a clean dressing  using gauze sponges, not tissue or cotton balls. Peri-Wound Care: Sween Lotion (Moisturizing lotion) 1 x Per Week/30 Days Discharge Instructions: Apply moisturizing lotion to foot with dressing changes Prim Dressing: Hydrofera Blue Classic Foam, 2x2 in 1 x Per Week/30 Days ary Discharge Instructions: rope Moisten with saline prior to applying to wound bed Secondary Dressing: Woven Gauze Sponge, Non-Sterile 4x4 in (Generic) 1 x Per Week/30 Days Discharge Instructions: Apply over primary dressing as directed. Secondary Dressing: ABD Pad, 5x9 1 x Per Week/30 Days Discharge Instructions: Apply over primary dressing as directed. Secured With: Coban Self-Adherent Wrap 4x5 (in/yd) (Generic) 1 x Per Week/30 Days Discharge Instructions: Secure with Coban as directed. Secured With: [employer], 4.5x3.1 (in/yd) (Generic) 1 x Per Week/30 Days Discharge Instructions: Secure with Kerlix as directed. Secured With: 74M Medipore H Soft Cloth Surgical T ape, 2x2 (in/yd) (Generic) 1 x Per Week/30 Days Discharge Instructions: Secure dressing with tape as directed. WOUND #7: - Foot Wound Laterality: Plantar, Right, Lateral Cleanser: Soap and Water 1 x Per Week/30 Days Discharge Instructions: May shower and wash wound with dial antibacterial soap and water prior to dressing change. Cleanser: Wound Cleanser 1 x Per Week/30 Days Discharge Instructions: Cleanse the wound with wound cleanser prior to applying a clean dressing using gauze sponges, not tissue or cotton balls. Peri-Wound Care: Sween Lotion (Moisturizing lotion) 1 x Per Week/30 Days Discharge Instructions: Apply moisturizing lotion to foot with dressing changes Prim Dressing: Hydrofera Blue Classic Foam, 2x2 in 1 x Per Week/30 Days ary Discharge Instructions: rope Moisten with saline prior to applying to wound bed Secondary Dressing: Woven Gauze Sponge, Non-Sterile 4x4 in (Generic) 1 x Per Week/30 Days Discharge Instructions: Apply over primary  dressing as directed. Secondary Dressing: ABD Pad, 5x9 1 x Per Week/30 Days Discharge  Instructions: Apply over primary dressing as directed. Secured With: Coban Self-Adherent Wrap 4x5 (in/yd) (Generic) 1 x Per Week/30 Days Discharge Instructions: Secure with Coban as directed. Secured With: [employer], 4.5x3.1 (in/yd) (Generic) 1 x Per Week/30 Days Discharge Instructions: Secure with Kerlix as directed. Secured With: 24M Medipore H Soft Cloth Surgical T ape, 2x2 (in/yd) (Generic) 1 x Per Week/30 Days Discharge Instructions: Secure dressing with tape as directed. WOUND #8: - Lower Leg Wound Laterality: Right, Anterior Cleanser: Soap and Water 1 x Per Week/30 Days Discharge Instructions: May shower and wash wound with dial antibacterial soap and water prior to dressing change. Cleanser: Wound Cleanser 1 x Per Week/30 Days Discharge Instructions: Cleanse the wound with wound cleanser prior to applying a clean dressing using gauze sponges, not tissue or cotton balls. Prim Dressing: Hydrofera Blue Classic Foam, 2x2 in 1 x Per Week/30 Days ary Discharge Instructions: Moisten with saline prior to applying to wound bed Secondary Dressing: Zetuvit Plus Silicone Border Dressing 4x4 (in/in) 1 x Per Week/30 Days Discharge Instructions: Apply silicone border over primary dressing as directed. 1. Would recommend currently that we going to continue with the wound care measures as before with the [facility] for the time being I think that still the best way to go right now until we get the approval for this gauze VAC. 2. Also can recommend that we have the patient continue with hyperbaric oxygen therapy I think that he is doing extremely well and to be honest I think that we are headed in the right direction as far as healing is concerned but again I do believe the gauze VAC would help speed this along. We will see patient back for reevaluation in 1 week here in the clinic. If anything worsens or  changes patient will contact our office for additional recommendations. Electronic Signature(s) Signed: 01/21/2021 1:30:54 PM By: Worthy Keeler PA-C Entered By: Worthy Keeler on 01/21/2021 13:30:54 -------------------------------------------------------------------------------- SuperBill Details Patient Name: Date of Service: Joseph RD, [name] 01/21/2021 Medical Record Number: [id number] Patient Account Number: [id number] Date of Birth/Sex: Treating RN: 13-Oct-1970 (50 y.o. [name] Primary Care Provider: [name] Other Clinician: Referring Provider: Treating Provider/Extender: [name], [name] in Treatment: 19 Diagnosis Coding ICD-10 Codes Code Description [phone] Other chronic osteomyelitis, right ankle and foot E11.621 Type 2 diabetes mellitus with foot ulcer L97.514 Non-pressure chronic ulcer of other part of right foot with necrosis of bone T81.31XS Disruption of external operation (surgical) wound, not elsewhere classified, sequela F17.218 Nicotine dependence, cigarettes, with other nicotine-induced disorders L97.522 Non-pressure chronic ulcer of other part of left foot with fat layer exposed Facility Procedures CPT4 Code: 73220254 Description: 99214 - WOUND CARE VISIT-LEV 4 EST PT Modifier: Quantity: 1 Physician Procedures : CPT4 Code Description Modifier [id number] - WC PHYS LEVEL 4 - EST PT ICD-10 Diagnosis Description M86.671 Other chronic osteomyelitis, right ankle and foot E11.621 Type 2 diabetes mellitus with foot ulcer L97.514 Non-pressure chronic ulcer of other  part of right foot with necrosis of bone T81.31XS Disruption of external operation (surgical) wound, not elsewhere classified, sequela Quantity: 1 Electronic Signature(s) Signed: 01/21/2021 1:31:24 PM By: Worthy Keeler PA-C Entered By: Worthy Keeler on 01/21/2021 13:31:23

## 2021-01-22 ENCOUNTER — Encounter (HOSPITAL_BASED_OUTPATIENT_CLINIC_OR_DEPARTMENT_OTHER): Payer: 59 | Admitting: Internal Medicine

## 2021-01-22 DIAGNOSIS — D72829 Elevated white blood cell count, unspecified: Secondary | ICD-10-CM | POA: Diagnosis not present

## 2021-01-22 LAB — GLUCOSE, CAPILLARY
Glucose-Capillary: 166 mg/dL — ABNORMAL HIGH (ref 70–99)
Glucose-Capillary: 120 mg/dL — ABNORMAL HIGH (ref 70–99)

## 2021-01-22 NOTE — Progress Notes (Addendum)
TRINDON, DORTON ([id number]) Visit Report for 01/22/2021 Arrival Information Details Patient Name: Date of Service: Reid [address] RD, [name] 01/22/2021 8:00 A M Medical Record Number: [id number] Patient Account Number: [id number] Date of Birth/Sex: Treating RN: 1971-01-31 (50 y.o. [name], Meta.Reding Primary Care Zuleyka Kloc: [name] Other Clinician: [name] Referring Abygail Galeno: Treating Casy Tavano/Extender: [name], [name] in Treatment: 19 Visit Information History Since Last Visit All ordered tests and consults were completed: Yes Patient Arrived: Cane Added or deleted any medications: No Arrival Time: 07:38 Any new allergies or adverse reactions: No Accompanied By: self Had a fall or experienced change in No Transfer Assistance: None activities of daily living that may affect Patient Identification Verified: Yes risk of falls: Secondary Verification Process Completed: Yes Signs or symptoms of abuse/neglect since last visito No Patient Requires Transmission-Based Precautions: No Hospitalized since last visit: No Patient Has Alerts: No Implantable device outside of the clinic excluding No cellular tissue based products placed in the center since last visit: Pain Present Now: No Electronic Signature(s) Signed: 01/22/2021 9:19:45 AM By: [name] [name] , , Entered By: [name] on 01/22/2021 09:19:45 -------------------------------------------------------------------------------- Encounter Discharge Information Details Patient Name: Date of Service: [name], [place]. 01/22/2021 8:00 A M Medical Record Number: [id number] Patient Account Number: [id number] Date of Birth/Sex: Treating RN: 03/29/1971 (50 y.o. [name] Primary Care Maudene Stotler: [name] Other Clinician: [name] Referring Veryl Abril: Treating Erron Wengert/Extender: [name], [name] in Treatment: 19 Encounter Discharge  Information Items Discharge Condition: Stable Ambulatory Status: Cane Discharge Destination: Home Transportation: Private Auto Accompanied By: self Schedule Follow-up Appointment: No Clinical Summary of Care: Electronic Signature(s) Signed: 01/22/2021 11:17:03 AM By: [name] [name] , , Entered By: [name] on 01/22/2021 11:17:03 -------------------------------------------------------------------------------- Vitals Details Patient Name: Date of Service: [name], [name] 01/22/2021 8:00 A M Medical Record Number: [id number] Patient Account Number: [id number] Date of Birth/Sex: Treating RN: 08/23/1970 (50 y.o. [name], [name] Primary Care Tristina Sahagian: [name] Other Clinician: [name] Referring Breyson Kelm: Treating Shahrzad Koble/Extender: [name], [name] in Treatment: 19 Vital Signs Time Taken: 08:14 Temperature (F): 98.1 Weight (lbs): 188 Pulse (bpm): 94 Respiratory Rate (breaths/min): 20 Blood Pressure (mmHg): 151/98 Capillary Blood Glucose (mg/dl): 166 Reference Range: 80 - 120 mg / dl Electronic Signature(s) Signed: 01/22/2021 9:26:29 AM By: [name] [name] , , Entered By: [name] on 01/22/2021 17:49:44

## 2021-01-22 NOTE — Progress Notes (Addendum)
[name], [name] ([id number]) Visit Report for 01/22/2021 HBO Details Patient Name: Date of Service: [address], [name] 01/22/2021 8:00 A M Medical Record Number: [id number] Patient Account Number: [id number] Date of Birth/Sex: Treating RN: 1970-10-17 (50 y.o. [name], [name] Primary Care [name]: [name] Other Clinician: Donavan [name] Referring [name]: Treating [name]/Extender: [name], [name] in Treatment: 19 HBO Treatment Course Details Treatment Course Number: 1 Ordering [name]: Worthy [name] T Treatments Ordered: otal 40 HBO Treatment Start Date: 12/08/2020 HBO Indication: Diabetic Ulcer(s) of the Lower Extremity HBO Treatment Details Treatment Number: 20 Patient Type: Outpatient Chamber Type: Monoplace Chamber Serial #: [id number] Treatment Protocol: 2.5 ATA with 90 minutes oxygen, with two 5 minute air breaks Treatment Details Compression Rate Down: 2.0 psi / minute De-Compression Rate Up: 2.0 psi / minute A breaks and breathing ir Compress Tx Pressure periods Decompress Decompress Begins Reached (leave unused spaces Begins Ends blank) Chamber Pressure (ATA 1 2.5 2.5 2.5 2.5 2.5 - - 2.5 1 ) Clock Time (24 hr) 08:21 08:32 09:02 09:07 09:37 09:42 - - 10:12 10:23 Treatment Length: 122 (minutes) Treatment Segments: 4 Vital Signs Capillary Blood Glucose Reference Range: 80 - 120 mg / dl HBO Diabetic Blood Glucose Intervention Range: <131 mg/dl or >249 mg/dl Type: Time Vitals Blood Pulse: Respiratory Temperature: Capillary Blood Glucose Pulse Action Taken: Pressure: Rate: Glucose (mg/dl): Meter #: Oximetry (%) Taken: Pre 08:14 151/98 94 20 98.1 166 Post 10:26 161/98 89 18 97.6 120 discharge per protocol >101 mg/dL Treatment Response Treatment Toleration: Well Treatment Completion Status: Treatment Completed without Adverse Event Additional Procedure Documentation Tissue Sevierity: Fat layer exposed [name] Notes No concerns  with treatment given Physician HBO Attestation: I certify that I supervised this HBO treatment in accordance with Medicare guidelines. A trained emergency response team is readily available per Yes hospital policies and procedures. Continue HBOT as ordered. Yes Electronic Signature(s) Signed: 01/27/2021 4:29:54 PM By: Joseph Ham MD Previous Signature: 01/22/2021 11:15:17 AM Version By: [name] [name] , , Entered By: [name] on 01/22/2021 16:42:49 -------------------------------------------------------------------------------- HBO Safety Checklist Details Patient Name: Date of Service: [name] RD, [name] [name] 01/22/2021 8:00 A M Medical Record Number: [id number] Patient Account Number: [id number] Date of Birth/Sex: Treating RN: 22-Jun-1970 (50 y.o. [name], [name] Primary Care [name]: [name] Other Clinician: Donavan [name] Referring [name]: Treating [name]/Extender: [name], [name] in Treatment: 19 HBO Safety Checklist Items Safety Checklist Consent Form Signed Patient voided / foley secured and emptied When did you last eato Breakfast Last dose of injectable or oral agent 0700 Ostomy pouch emptied and vented if applicable NA All implantable devices assessed, documented and approved NA Intravenous access site secured and place NA Valuables secured Linens and cotton and cotton/polyester blend (less than 51% polyester) Personal oil-based products / skin lotions / body lotions removed Wigs or hairpieces removed NA Smoking or tobacco materials removed NA Books / newspapers / magazines / loose paper removed Cologne, aftershave, perfume and deodorant removed Jewelry removed (may wrap wedding band) Make-up removed NA Hair care products removed Battery operated devices (external) removed Heating patches and chemical warmers removed Titanium eyewear removed NA Nail polish cured greater than 10 hours NA Casting  material cured greater than 10 hours NA Hearing aids removed NA Loose dentures or partials removed NA Prosthetics have been removed NA Patient demonstrates correct use of air break device (if applicable) Patient concerns have been addressed Patient grounding bracelet on and cord attached to chamber Specifics  for Inpatients (complete in addition to above) Medication sheet sent with patient NA Intravenous medications needed or due during therapy sent with patient NA Drainage tubes (e.g. nasogastric tube or chest tube secured and vented) NA Endotracheal or Tracheotomy tube secured NA Cuff deflated of air and inflated with saline NA Airway suctioned NA Notes Paper version used prior to treatment Electronic Signature(s) Signed: 01/22/2021 11:12:35 AM By: [name] [name] , , Previous Signature: 01/22/2021 9:27:57 AM Version By: [name] [name] , , Entered By: [name] on 01/22/2021 11:12:35

## 2021-01-23 ENCOUNTER — Other Ambulatory Visit: Payer: Self-pay

## 2021-01-23 ENCOUNTER — Other Ambulatory Visit: Payer: Self-pay | Admitting: Nurse Practitioner

## 2021-01-23 ENCOUNTER — Encounter (HOSPITAL_BASED_OUTPATIENT_CLINIC_OR_DEPARTMENT_OTHER): Payer: 59 | Admitting: Internal Medicine

## 2021-01-23 ENCOUNTER — Other Ambulatory Visit: Payer: Self-pay | Admitting: Family

## 2021-01-23 DIAGNOSIS — E11621 Type 2 diabetes mellitus with foot ulcer: Secondary | ICD-10-CM | POA: Diagnosis not present

## 2021-01-23 DIAGNOSIS — M86671 Other chronic osteomyelitis, right ankle and foot: Secondary | ICD-10-CM

## 2021-01-23 DIAGNOSIS — Z794 Long term (current) use of insulin: Secondary | ICD-10-CM

## 2021-01-23 DIAGNOSIS — L97514 Non-pressure chronic ulcer of other part of right foot with necrosis of bone: Secondary | ICD-10-CM | POA: Diagnosis not present

## 2021-01-23 DIAGNOSIS — K5909 Other constipation: Secondary | ICD-10-CM

## 2021-01-23 DIAGNOSIS — I25119 Atherosclerotic heart disease of native coronary artery with unspecified angina pectoris: Secondary | ICD-10-CM

## 2021-01-23 DIAGNOSIS — D72829 Elevated white blood cell count, unspecified: Secondary | ICD-10-CM | POA: Diagnosis not present

## 2021-01-23 DIAGNOSIS — E785 Hyperlipidemia, unspecified: Secondary | ICD-10-CM

## 2021-01-23 DIAGNOSIS — I10 Essential (primary) hypertension: Secondary | ICD-10-CM

## 2021-01-23 DIAGNOSIS — E1142 Type 2 diabetes mellitus with diabetic polyneuropathy: Secondary | ICD-10-CM

## 2021-01-23 LAB — GLUCOSE, CAPILLARY
Glucose-Capillary: 185 mg/dL — ABNORMAL HIGH (ref 70–99)
Glucose-Capillary: 120 mg/dL — ABNORMAL HIGH (ref 70–99)

## 2021-01-23 NOTE — Progress Notes (Signed)
ESCHER, HARR ([id number]) Visit Report for 01/23/2021 SuperBill Details Patient Name: Date of Service: [address], [name] 01/23/2021 Medical Record Number: [id number] Patient Account Number: [id number] Date of Birth/Sex: Treating RN: Apr 24, 1970 (50 y.o. [name] Primary Care Provider: [name] Other Clinician: [name] Referring Provider: Treating Provider/Extender: [name], [name] in Treatment: 20 Diagnosis Coding ICD-10 Codes Code Description [phone] Other chronic osteomyelitis, right ankle and foot E11.621 Type 2 diabetes mellitus with foot ulcer L97.514 Non-pressure chronic ulcer of other part of right foot with necrosis of bone T81.31XS Disruption of external operation (surgical) wound, not elsewhere classified, sequela F17.218 Nicotine dependence, cigarettes, with other nicotine-induced disorders L97.522 Non-pressure chronic ulcer of other part of left foot with fat layer exposed Facility Procedures CPT4 Code Description Modifier Quantity [id number] G0277-(Facility Use Only) HBOT full body chamber, 90min , 4 ICD-10 Diagnosis Description M86.671 Other chronic osteomyelitis, right ankle and foot E11.621 Type 2 diabetes mellitus with foot ulcer L97.514 Non-pressure chronic ulcer of other part of right foot with necrosis of bone Physician Procedures Quantity CPT4 Code Description Modifier [id number] - WC PHYS HYPERBARIC OXYGEN THERAPY 1 ICD-10 Diagnosis Description M86.671 Other chronic osteomyelitis, right ankle and foot E11.621 Type 2 diabetes mellitus with foot ulcer L97.514 Non-pressure chronic ulcer of other part of right foot with necrosis of bone Electronic Signature(s) Signed: 01/23/2021 11:15:12 AM By: [name] [name] , , Signed: 01/23/2021 3:09:19 PM By: Kalman Shan DO Entered By: [name] on 01/23/2021 11:15:11

## 2021-01-23 NOTE — Progress Notes (Addendum)
DELAWRENCE, FRIDMAN ([id number]) Visit Report for 01/23/2021 HBO Details Patient Name: Date of Service: [address], [name] 01/23/2021 8:00 A M Medical Record Number: [id number] Patient Account Number: [id number] Date of Birth/Sex: Treating RN: Aug 10, 1970 (50 y.o. [name] Primary Care Acelin Ferdig: [name] Other Clinician: [name] Referring Ammie Warrick: Treating Cinde Ebert/Extender: [name], [name] in Treatment: 20 HBO Treatment Course Details Treatment Course Number: 1 Ordering Charleston Vierling: [name] Treatments Ordered: otal 40 HBO Treatment Start Date: 12/08/2020 HBO Indication: Diabetic Ulcer(s) of the Lower Extremity HBO Treatment Details Treatment Number: 21 Patient Type: Outpatient Chamber Type: Monoplace Chamber Serial #: [id number] Treatment Protocol: 2.5 ATA with 90 minutes oxygen, with two 5 minute air breaks Treatment Details Compression Rate Down: 2.0 psi / minute De-Compression Rate Up: 2.0 psi / minute A breaks and breathing ir Compress Tx Pressure periods Decompress Decompress Begins Reached (leave unused spaces Begins Ends blank) Chamber Pressure (ATA 1 2.5 2.5 2.5 2.5 2.5 - - 2.5 1 ) Clock Time (24 hr) 08:21 08:32 09:02 09:07 09:37 09:42 - - 10:12 10:23 Treatment Length: 122 (minutes) Treatment Segments: 4 Vital Signs Capillary Blood Glucose Reference Range: 80 - 120 mg / dl HBO Diabetic Blood Glucose Intervention Range: <131 mg/dl or >249 mg/dl Type: Time Vitals Blood Pulse: Respiratory Temperature: Capillary Blood Glucose Pulse Action Taken: Pressure: Rate: Glucose (mg/dl): Meter #: Oximetry (%) Taken: Pre 07:58 146/90 96 18 97.8 185 Post 10:34 144/91 90 18 97.7 120 discharge per protocol >101 mg/dL Treatment Response Treatment Toleration: Well Treatment Completion Status: Treatment Completed without Adverse Event Additional Procedure Documentation Tissue Sevierity: Fat layer exposed Physician HBO  Attestation: I certify that I supervised this HBO treatment in accordance with Medicare guidelines. A trained emergency response team is readily available per Yes hospital policies and procedures. Continue HBOT as ordered. Yes Electronic Signature(s) Signed: 01/23/2021 3:09:19 PM By: Kalman Shan DO Previous Signature: 01/23/2021 11:14:49 AM Version By: [name] [name] , , Entered By: Kalman Shan on 01/23/2021 15:07:45 -------------------------------------------------------------------------------- HBO Safety Checklist Details Patient Name: Date of Service: [name], [place] [name] 01/23/2021 8:00 A M Medical Record Number: [id number] Patient Account Number: [id number] Date of Birth/Sex: Treating RN: 1970/12/13 (50 y.o. [name] Primary Care Eyad Rochford: [name] Other Clinician: [name] Referring Aiza Vollrath: Treating Arul Farabee/Extender: [name], [name] in Treatment: 20 HBO Safety Checklist Items Safety Checklist Consent Form Signed Patient voided / foley secured and emptied When did you last eato Breakfast Last dose of injectable or oral agent n/a Ostomy pouch emptied and vented if applicable NA All implantable devices assessed, documented and approved NA Intravenous access site secured and place NA Valuables secured NA Linens and cotton and cotton/polyester blend (less than 51% polyester) Personal oil-based products / skin lotions / body lotions removed Wigs or hairpieces removed NA Smoking or tobacco materials removed Books / newspapers / magazines / loose paper removed Cologne, aftershave, perfume and deodorant removed Jewelry removed (may wrap wedding band) NA Make-up removed NA Hair care products removed NA Battery operated devices (external) removed Heating patches and chemical warmers removed Titanium eyewear removed NA Nail polish cured greater than 10 hours NA Casting material cured greater  than 10 hours NA Hearing aids removed NA Loose dentures or partials removed NA Prosthetics have been removed NA Patient demonstrates correct use of air break device (if applicable) Patient concerns have been addressed Patient grounding bracelet on and cord attached to chamber Specifics for Inpatients (complete in addition  to above) Medication sheet sent with patient NA Intravenous medications needed or due during therapy sent with patient NA Drainage tubes (e.g. nasogastric tube or chest tube secured and vented) NA Endotracheal or Tracheotomy tube secured NA Cuff deflated of air and inflated with saline NA Airway suctioned NA Notes Paper version used prior to treatment Electronic Signature(s) Signed: 01/23/2021 11:18:05 AM By: [name] [name] , , Previous Signature: 01/23/2021 9:09:09 AM Version By: [name] [name] , , Entered By: [name] on 01/23/2021 11:18:04

## 2021-01-23 NOTE — Progress Notes (Addendum)
Joseph, [name] ([id number]) Visit Report for 01/23/2021 Arrival Information Details Patient Name: Date of Service: [address], [name] 01/23/2021 8:00 A M Medical Record Number: [id number] Patient Account Number: [id number] Date of Birth/Sex: Treating RN: 01-19-1971 (50 y.o. Joseph Amor, Vaughan [name] Primary Care Quenisha Lovins: [name] Other Clinician: [name] Referring Nirvan Laban: Treating Cyrene Gharibian/Extender: [name], [name] in Treatment: 20 Visit Information History Since Last Visit All ordered tests and consults were completed: Yes Patient Arrived: [name] Added or deleted any medications: No Arrival Time: 07:37 Any new allergies or adverse reactions: No Accompanied By: self Had a fall or experienced change in No Transfer Assistance: None activities of daily living that may affect Patient Identification Verified: Yes risk of falls: Secondary Verification Process Completed: Yes Signs or symptoms of abuse/neglect since last visito No Patient Requires Transmission-Based Precautions: No Hospitalized since last visit: No Patient Has Alerts: No Implantable device outside of the clinic excluding No cellular tissue based products placed in the center since last visit: Pain Present Now: No Electronic Signature(s) Signed: 01/23/2021 8:59:56 AM By: [name] [name] , , Entered By: [name] on 01/23/2021 08:59:55 -------------------------------------------------------------------------------- Encounter Discharge Information Details Patient Name: Date of Service: [name]. 01/23/2021 8:00 A M Medical Record Number: [id number] Patient Account Number: [id number] Date of Birth/Sex: Treating RN: 08/04/1970 (50 y.o. [name] Primary Care Arrow Tomko: [name] Other Clinician: [name] Referring Lash Matulich: Treating Abisola Carrero/Extender: [name], [name] in Treatment: 20 Encounter Discharge  Information Items Discharge Condition: Stable Ambulatory Status: Cane Discharge Destination: Home Transportation: Private Auto Accompanied By: self Schedule Follow-up Appointment: No Clinical Summary of Care: Electronic Signature(s) Signed: 01/23/2021 11:16:10 AM By: [name] [name] , , Entered By: [name] on 01/23/2021 11:16:10 -------------------------------------------------------------------------------- Vitals Details Patient Name: Date of Service: [address], [name] 01/23/2021 8:00 A M Medical Record Number: [id number] Patient Account Number: [id number] Date of Birth/Sex: Treating RN: 23-Jul-1970 (50 y.o. [name] Primary Care Malaney Mcbean: [name] Other Clinician: [name] Referring Lauralynn Loeb: Treating Jacy Brocker/Extender: [name], [name] in Treatment: 20 Vital Signs Time Taken: 07:58 Temperature (F): 97.8 Weight (lbs): 188 Pulse (bpm): 96 Respiratory Rate (breaths/min): 18 Blood Pressure (mmHg): 146/90 Capillary Blood Glucose (mg/dl): 185 Reference Range: 80 - 120 mg / dl Electronic Signature(s) Signed: 01/23/2021 9:03:03 AM By: [name] [name] , , Entered By: [name] on 01/23/2021 09:03:03

## 2021-01-26 ENCOUNTER — Other Ambulatory Visit: Payer: Self-pay

## 2021-01-26 ENCOUNTER — Encounter (HOSPITAL_BASED_OUTPATIENT_CLINIC_OR_DEPARTMENT_OTHER): Payer: 59 | Admitting: Internal Medicine

## 2021-01-26 DIAGNOSIS — M86671 Other chronic osteomyelitis, right ankle and foot: Secondary | ICD-10-CM | POA: Diagnosis not present

## 2021-01-26 DIAGNOSIS — E11621 Type 2 diabetes mellitus with foot ulcer: Secondary | ICD-10-CM

## 2021-01-26 DIAGNOSIS — D72829 Elevated white blood cell count, unspecified: Secondary | ICD-10-CM | POA: Diagnosis not present

## 2021-01-26 LAB — GLUCOSE, CAPILLARY
Glucose-Capillary: 151 mg/dL — ABNORMAL HIGH (ref 70–99)
Glucose-Capillary: 161 mg/dL — ABNORMAL HIGH (ref 70–99)

## 2021-01-26 NOTE — Progress Notes (Addendum)
[name], [name] ([id number]) Visit Report for 01/26/2021 HBO Details Patient Name: Date of Service: [name], [name] 01/26/2021 8:00 A M Medical Record Number: [id number] Patient Account Number: [id number] Date of Birth/Sex: Treating RN: 25-Dec-1970 (50 y.o. [name] Primary Care Elyanah Farino: [name] Other Clinician: [name] Referring Taffy Delconte: Treating Gaylon Bentz/Extender: [name], [name] in Treatment: 20 HBO Treatment Course Details Treatment Course Number: 1 Ordering Marlyss Cissell: [name] Treatments Ordered: otal 40 HBO Treatment Start Date: 12/08/2020 HBO Indication: Diabetic Ulcer(s) of the Lower Extremity HBO Treatment Details Treatment Number: 22 Patient Type: Outpatient Chamber Type: Monoplace Chamber Serial #: [id number] Treatment Protocol: 2.5 ATA with 90 minutes oxygen, with two 5 minute air breaks Treatment Details Compression Rate Down: 2.0 psi / minute De-Compression Rate Up: 2.0 psi / minute A breaks and breathing ir Compress Tx Pressure periods Decompress Decompress Begins Reached (leave unused spaces Begins Ends blank) Chamber Pressure (ATA 1 2.5 2.5 2.5 2.5 2.5 - - 2.5 1 ) Clock Time (24 hr) 08:16 08:27 08:57 09:02 09:32 09:37 - - 10:07 10:18 Treatment Length: 122 (minutes) Treatment Segments: 4 Vital Signs Capillary Blood Glucose Reference Range: 80 - 120 mg / dl HBO Diabetic Blood Glucose Intervention Range: <131 mg/dl or >249 mg/dl Time Vitals Blood Respiratory Capillary Blood Glucose Pulse Action Type: Pulse: Temperature: Taken: Pressure: Rate: Glucose (mg/dl): Meter #: Oximetry (%) Taken: Pre 08:08 126/67 95 18 97.8 151 Post 10:25 138/90 89 18 97.5 161 Treatment Response Treatment Toleration: Well Treatment Completion Status: Treatment Completed without Adverse Event Additional Procedure Documentation Tissue Sevierity: Fat layer exposed Physician HBO Attestation: I certify that I supervised  this HBO treatment in accordance with Medicare guidelines. A trained emergency response team is readily available per Yes hospital policies and procedures. Continue HBOT as ordered. Yes Electronic Signature(s) Signed: 01/26/2021 4:37:26 PM By: Kalman Shan DO Previous Signature: 01/26/2021 11:06:00 AM Version By: [name] [name] , , Entered By: Kalman Shan on 01/26/2021 16:36:05 -------------------------------------------------------------------------------- HBO Safety Checklist Details Patient Name: Date of Service: [name], [place] [name] 01/26/2021 8:00 A M Medical Record Number: [id number] Patient Account Number: [id number] Date of Birth/Sex: Treating RN: 1970-10-24 (50 y.o. [name] Primary Care Jahseh Lucchese: [name] Other Clinician: [name] Referring Lunell Robart: Treating Shiraz Bastyr/Extender: [name], [name] in Treatment: 20 HBO Safety Checklist Items Safety Checklist Consent Form Signed Patient voided / foley secured and emptied When did you last eato 0700 Last dose of injectable or oral agent 0630 Ostomy pouch emptied and vented if applicable NA All implantable devices assessed, documented and approved NA Intravenous access site secured and place NA Valuables secured Linens and cotton and cotton/polyester blend (less than 51% polyester) Personal oil-based products / skin lotions / body lotions removed Wigs or hairpieces removed NA Smoking or tobacco materials removed NA Books / newspapers / magazines / loose paper removed Cologne, aftershave, perfume and deodorant removed Jewelry removed (may wrap wedding band) Make-up removed NA Hair care products removed Battery operated devices (external) removed Heating patches and chemical warmers removed Titanium eyewear removed NA Nail polish cured greater than 10 hours NA Casting material cured greater than 10 hours NA Hearing aids removed NA Loose  dentures or partials removed NA Prosthetics have been removed NA Patient demonstrates correct use of air break device (if applicable) Patient concerns have been addressed Patient grounding bracelet on and cord attached to chamber Specifics for Inpatients (complete in addition to above) Medication sheet sent with patient  NA Intravenous medications needed or due during therapy sent with patient NA Drainage tubes (e.g. nasogastric tube or chest tube secured and vented) NA Endotracheal or Tracheotomy tube secured NA Cuff deflated of air and inflated with saline NA Airway suctioned NA Notes Paper version used prior to treatment. Electronic Signature(s) Signed: 01/26/2021 9:10:40 AM By: [name] [name] , , Entered By: [name] on 01/26/2021 09:10:40

## 2021-01-26 NOTE — Progress Notes (Signed)
HOOPER, PETTEWAY ([id number]) Visit Report for 01/26/2021 SuperBill Details Patient Name: Date of Service: [place] [address], [name] 01/26/2021 Medical Record Number: [id number] Patient Account Number: [id number] Date of Birth/Sex: Treating RN: 1970-07-13 (50 y.o. [name] Primary Care Provider: [name] Other Clinician: [name] Referring Provider: Treating Provider/Extender: [name], [name] in Treatment: 20 Diagnosis Coding ICD-10 Codes Code Description [phone] Other chronic osteomyelitis, right ankle and foot E11.621 Type 2 diabetes mellitus with foot ulcer L97.514 Non-pressure chronic ulcer of other part of right foot with necrosis of bone T81.31XS Disruption of external operation (surgical) wound, not elsewhere classified, sequela F17.218 Nicotine dependence, cigarettes, with other nicotine-induced disorders L97.522 Non-pressure chronic ulcer of other part of left foot with fat layer exposed Facility Procedures CPT4 Code Description Modifier Quantity [id number] G0277-(Facility Use Only) HBOT full body chamber, 80min , 4 ICD-10 Diagnosis Description M86.671 Other chronic osteomyelitis, right ankle and foot E11.621 Type 2 diabetes mellitus with foot ulcer Physician Procedures Quantity CPT4 Code Description Modifier [id number] - WC PHYS HYPERBARIC OXYGEN THERAPY 1 ICD-10 Diagnosis Description M86.671 Other chronic osteomyelitis, right ankle and foot E11.621 Type 2 diabetes mellitus with foot ulcer Electronic Signature(s) Signed: 01/26/2021 11:06:15 AM By: [name] [name] , , Signed: 01/26/2021 4:37:26 PM By: Kalman Shan DO Entered By: [name] on 01/26/2021 11:06:14

## 2021-01-26 NOTE — Progress Notes (Addendum)
DAYSHAWN, IRIZARRY ([id number]) Visit Report for 01/26/2021 Arrival Information Details Patient Name: Date of Service: [address] [address], [name] 01/26/2021 8:00 A M Medical Record Number: [id number] Patient Account Number: [id number] Date of Birth/Sex: Treating RN: [birth year] (50 y.o. [name] Primary Care Romir Klimowicz: [name] Other Clinician: [name] Referring Eoghan Belcher: Treating Ross Bender/Extender: [name], [name] in Treatment: 20 Visit Information History Since Last Visit All ordered tests and consults were completed: Yes Patient Arrived: [name] Added or deleted any medications: No Arrival Time: 07:37 Any new allergies or adverse reactions: No Accompanied By: self Had a fall or experienced change in No Transfer Assistance: None activities of daily living that may affect Patient Identification Verified: Yes risk of falls: Secondary Verification Process Completed: Yes Signs or symptoms of abuse/neglect since last visito No Patient Requires Transmission-Based Precautions: No Hospitalized since last visit: No Patient Has Alerts: No Implantable device outside of the clinic excluding No cellular tissue based products placed in the center since last visit: Pain Present Now: No Electronic Signature(s) Signed: 01/26/2021 9:07:44 AM By: [name] [name] , , Entered By: [name] on 01/26/2021 09:07:44 -------------------------------------------------------------------------------- Encounter Discharge Information Details Patient Name: Date of Service: [name], [place]. 01/26/2021 8:00 A M Medical Record Number: [id number] Patient Account Number: [id number] Date of Birth/Sex: Treating RN: Oct 04, 1970 (50 y.o. [name] Primary Care Tammi Boulier: [name] Other Clinician: [name] Referring Skylinn Vialpando: Treating Lendell Gallick/Extender: [name], [name] in Treatment: 20 Encounter Discharge  Information Items Discharge Condition: Stable Ambulatory Status: Cane Discharge Destination: Home Transportation: Private Auto Accompanied By: self Schedule Follow-up Appointment: No Clinical Summary of Care: Electronic Signature(s) Signed: 01/26/2021 11:06:45 AM By: [name] [name] , , Entered By: [name] on 01/26/2021 11:06:45 -------------------------------------------------------------------------------- Vitals Details Patient Name: Date of Service: [name], [name] 01/26/2021 8:00 A M Medical Record Number: [id number] Patient Account Number: [id number] Date of Birth/Sex: Treating RN: 03/07/1971 (50 y.o. [name] Primary Care Evarose Altland: [name] Other Clinician: [name] Referring Anatalia Kronk: Treating Menashe Kafer/Extender: [name], [name] in Treatment: 20 Vital Signs Time Taken: 08:08 Temperature (F): 97.8 Weight (lbs): 188 Pulse (bpm): 95 Respiratory Rate (breaths/min): 18 Blood Pressure (mmHg): 126/67 Capillary Blood Glucose (mg/dl): 151 Reference Range: 80 - 120 mg / dl Electronic Signature(s) Signed: 01/26/2021 9:09:12 AM By: [name] [name] , , Entered By: [name] on 01/26/2021 09:09:11

## 2021-01-27 ENCOUNTER — Encounter (HOSPITAL_BASED_OUTPATIENT_CLINIC_OR_DEPARTMENT_OTHER): Payer: 59 | Attending: Internal Medicine | Admitting: Internal Medicine

## 2021-01-27 ENCOUNTER — Other Ambulatory Visit: Payer: Self-pay | Admitting: Family

## 2021-01-27 DIAGNOSIS — F17218 Nicotine dependence, cigarettes, with other nicotine-induced disorders: Secondary | ICD-10-CM | POA: Insufficient documentation

## 2021-01-27 DIAGNOSIS — E1140 Type 2 diabetes mellitus with diabetic neuropathy, unspecified: Secondary | ICD-10-CM | POA: Diagnosis not present

## 2021-01-27 DIAGNOSIS — L97522 Non-pressure chronic ulcer of other part of left foot with fat layer exposed: Secondary | ICD-10-CM | POA: Diagnosis not present

## 2021-01-27 DIAGNOSIS — E1169 Type 2 diabetes mellitus with other specified complication: Secondary | ICD-10-CM | POA: Insufficient documentation

## 2021-01-27 DIAGNOSIS — L84 Corns and callosities: Secondary | ICD-10-CM | POA: Diagnosis not present

## 2021-01-27 DIAGNOSIS — N183 Chronic kidney disease, stage 3 unspecified: Secondary | ICD-10-CM | POA: Insufficient documentation

## 2021-01-27 DIAGNOSIS — L97514 Non-pressure chronic ulcer of other part of right foot with necrosis of bone: Secondary | ICD-10-CM | POA: Diagnosis not present

## 2021-01-27 DIAGNOSIS — E1122 Type 2 diabetes mellitus with diabetic chronic kidney disease: Secondary | ICD-10-CM | POA: Diagnosis not present

## 2021-01-27 DIAGNOSIS — E559 Vitamin D deficiency, unspecified: Secondary | ICD-10-CM

## 2021-01-27 DIAGNOSIS — F32A Depression, unspecified: Secondary | ICD-10-CM

## 2021-01-27 DIAGNOSIS — E11621 Type 2 diabetes mellitus with foot ulcer: Secondary | ICD-10-CM | POA: Diagnosis not present

## 2021-01-27 DIAGNOSIS — Z89421 Acquired absence of other right toe(s): Secondary | ICD-10-CM | POA: Insufficient documentation

## 2021-01-27 DIAGNOSIS — E1142 Type 2 diabetes mellitus with diabetic polyneuropathy: Secondary | ICD-10-CM

## 2021-01-27 DIAGNOSIS — X58XXXS Exposure to other specified factors, sequela: Secondary | ICD-10-CM | POA: Insufficient documentation

## 2021-01-27 DIAGNOSIS — T8131XS Disruption of external operation (surgical) wound, not elsewhere classified, sequela: Secondary | ICD-10-CM | POA: Insufficient documentation

## 2021-01-27 DIAGNOSIS — M86671 Other chronic osteomyelitis, right ankle and foot: Secondary | ICD-10-CM | POA: Diagnosis not present

## 2021-01-27 DIAGNOSIS — Z794 Long term (current) use of insulin: Secondary | ICD-10-CM

## 2021-01-27 DIAGNOSIS — F419 Anxiety disorder, unspecified: Secondary | ICD-10-CM

## 2021-01-27 LAB — GLUCOSE, CAPILLARY
Glucose-Capillary: 172 mg/dL — ABNORMAL HIGH (ref 70–99)
Glucose-Capillary: 108 mg/dL — ABNORMAL HIGH (ref 70–99)

## 2021-01-27 MED ORDER — VITAMIN D3 50 MCG (2000 UT) PO CAPS: 2000.0000 [IU] | ORAL_CAPSULE | Freq: Every day | ORAL | 0 refills | Status: AC

## 2021-01-27 MED ORDER — ESCITALOPRAM OXALATE 20 MG PO TABS: 20.0000 mg | ORAL_TABLET | Freq: Every day | ORAL | 0 refills | Status: DC

## 2021-01-27 MED ORDER — INSULIN LISPRO (1 UNIT DIAL) 100 UNIT/ML (KWIKPEN): PEN_INJECTOR | SUBCUTANEOUS | 0 refills | Status: DC

## 2021-01-27 NOTE — Progress Notes (Signed)
[name], [name] ([id number]) Visit Report for 01/27/2021 SuperBill Details Patient Name: Date of Service: [place] [address], [name] 01/27/2021 Medical Record Number: [id number] Patient Account Number: [id number] Date of Birth/Sex: Treating RN: [birth year] (50 y.o. [name], [name] Primary Care Provider: [name] Other Clinician: [name] Referring Provider: Treating Provider/Extender: [name], [name] in Treatment: 20 Diagnosis Coding ICD-10 Codes Code Description 6043016635 Other chronic osteomyelitis, right ankle and foot E11.621 Type 2 diabetes mellitus with foot ulcer L97.514 Non-pressure chronic ulcer of other part of right foot with necrosis of bone T81.31XS Disruption of external operation (surgical) wound, not elsewhere classified, sequela F17.218 Nicotine dependence, cigarettes, with other nicotine-induced disorders L97.522 Non-pressure chronic ulcer of other part of left foot with fat layer exposed Facility Procedures CPT4 Code Description Modifier Quantity [id number] G0277-(Facility Use Only) HBOT full body chamber, 82min , 4 ICD-10 Diagnosis Description M86.671 Other chronic osteomyelitis, right ankle and foot E11.621 Type 2 diabetes mellitus with foot ulcer Physician Procedures Quantity CPT4 Code Description Modifier [id number] 19471 - WC PHYS HYPERBARIC OXYGEN THERAPY 1 ICD-10 Diagnosis Description M86.671 Other chronic osteomyelitis, right ankle and foot E11.621 Type 2 diabetes mellitus with foot ulcer Electronic Signature(s) Signed: 01/27/2021 11:15:43 AM By: [name] [name] , , Signed: 01/27/2021 4:29:54 PM By: [name] MD Entered By: [name] on 01/27/2021 11:15:36

## 2021-01-27 NOTE — Progress Notes (Addendum)
KEIYON, PLACK ([id number]) Visit Report for 01/27/2021 Arrival Information Details Patient Name: Date of Service: [address] [address], [name] 01/27/2021 8:00 A M Medical Record Number: [id number] Patient Account Number: [id number] Date of Birth/Sex: Treating RN: 11-07-1970 (50 y.o. [name], [name] Primary Care Malaika Arnall: [name] Other Clinician: [name] Referring Lyann Hagstrom: Treating Zakhia Seres/Extender: [name], [name] in Treatment: 20 Visit Information History Since Last Visit All ordered tests and consults were completed: Yes Patient Arrived: [name] Added or deleted any medications: No Arrival Time: 07:40 Any new allergies or adverse reactions: No Accompanied By: self Had a fall or experienced change in No Transfer Assistance: None activities of daily living that may affect Patient Identification Verified: Yes risk of falls: Secondary Verification Process Completed: Yes Signs or symptoms of abuse/neglect since last visito No Patient Requires Transmission-Based Precautions: No Hospitalized since last visit: No Patient Has Alerts: No Implantable device outside of the clinic excluding No cellular tissue based products placed in the center since last visit: Pain Present Now: No Electronic Signature(s) Signed: 01/27/2021 8:41:33 AM By: [name] [name] , , Entered By: [name] on 01/27/2021 08:41:33 -------------------------------------------------------------------------------- Encounter Discharge Information Details Patient Name: Date of Service: [name], [place]. 01/27/2021 8:00 A M Medical Record Number: [id number] Patient Account Number: [id number] Date of Birth/Sex: Treating RN: 1970/08/11 (50 y.o. [name], [name] Primary Care Therma Lasure: [name] Other Clinician: [name] Referring Reka Wist: Treating Mayleen Borrero/Extender: [name], [name] in Treatment: 20 Encounter Discharge  Information Items Discharge Condition: Stable Ambulatory Status: Cane Discharge Destination: Home Transportation: Private Auto Accompanied By: self Schedule Follow-up Appointment: No Clinical Summary of Care: Electronic Signature(s) Signed: 01/27/2021 11:17:06 AM By: [name] [name] , , Entered By: [name] on 01/27/2021 11:17:05 -------------------------------------------------------------------------------- [place] Details Patient Name: Date of Service: [name], [place]. 01/27/2021 8:00 A M Medical Record Number: [id number] Patient Account Number: [id number] Date of Birth/Sex: Treating RN: 1970/09/27 (50 y.o. [name], [name] Primary Care Tahmid Stonehocker: [name] Other Clinician: [name] Referring Roselee Tayloe: Treating Nayda Riesen/Extender: [name], [name] in Treatment: 20 Vital Signs Time Taken: 08:04 Temperature (F): 98.2 Weight (lbs): 188 Pulse (bpm): 86 Respiratory Rate (breaths/min): 20 Blood Pressure (mmHg): 124/100 Capillary Blood Glucose (mg/dl): 172 Reference Range: 80 - 120 mg / dl Electronic Signature(s) Signed: 01/27/2021 8:43:05 AM By: [name] [name] , , Entered By: [name] on 01/27/2021 08:43:05

## 2021-01-27 NOTE — Telephone Encounter (Signed)
Amlodipine, Atorvastatin, Carvedilol, Clonidine, and Gabapentin refilled on 09/23/2020 for 6 month supply ending 02/23/2021. Patient's next appointment 02/04/2021 will refill at that time.   Lantus and Trulicity refilled 96/01/6434.will update refills if needed at next appointment 02/04/2021.  Escitalopram, Insulin Lispro, and Vitamin D3 30 day courtesy refill. Please keep appointment 02/04/2021 for additional refills.   Patient no longer taking Insulin Semglee as he is currently taking Insulin Glargine.

## 2021-01-27 NOTE — Progress Notes (Addendum)
WESAM, GEARHART ([id number]) Visit Report for 01/27/2021 HBO Details Patient Name: Date of Service: [address] RD, [place]. 01/27/2021 8:00 A M Medical Record Number: [id number] Patient Account Number: [id number] Date of Birth/Sex: Treating RN: 11-Mar-1971 (50 y.o. [name], Gray Primary Care Zavannah Deblois: [name] Other Clinician: [name] Referring Jullian Clayson: Treating Hazael Olveda/Extender: [name], [name] in Treatment: 20 HBO Treatment Course Details Treatment Course Number: 1 Ordering Kayvon Mo: [name] Treatments Ordered: otal 40 HBO Treatment Start Date: 12/08/2020 HBO Indication: Diabetic Ulcer(s) of the Lower Extremity HBO Treatment Details Treatment Number: 23 Patient Type: Outpatient Chamber Type: Monoplace Chamber Serial #: [id number] Treatment Protocol: 2.5 ATA with 90 minutes oxygen, with two 5 minute air breaks Treatment Details Compression Rate Down: 2.0 psi / minute De-Compression Rate Up: 2.0 psi / minute A breaks and breathing ir Compress Tx Pressure periods Decompress Decompress Begins Reached (leave unused spaces Begins Ends blank) Chamber Pressure (ATA 1 2.5 2.5 2.5 2.5 2.5 - - 2.5 1 ) Clock Time (24 hr) 08:06 08:17 08:47 08:52 09:22 09:27 - - 09:57 10:08 Treatment Length: 122 (minutes) Treatment Segments: 4 Vital Signs Capillary Blood Glucose Reference Range: 80 - 120 mg / dl HBO Diabetic Blood Glucose Intervention Range: <131 mg/dl or >249 mg/dl Type: Time Vitals Blood Pulse: Respiratory Temperature: Capillary Blood Glucose Pulse Action Taken: Pressure: Rate: Glucose (mg/dl): Meter #: Oximetry (%) Taken: Pre 08:04 124/100 86 20 98.2 172 Post 10:09 150/90 83 18 97.6 108 discharge per protocol >101 mg/dL Treatment Response Treatment Toleration: Well Treatment Completion Status: Treatment Completed without Adverse Event Additional Procedure Documentation Tissue Sevierity: Fat layer exposed Argenis Kumari Notes No  concerns with treatment given Physician HBO Attestation: I certify that I supervised this HBO treatment in accordance with Medicare guidelines. A trained emergency response team is readily available per Yes hospital policies and procedures. Continue HBOT as ordered. Yes Electronic Signature(s) Signed: 01/27/2021 4:29:54 PM By: [name] MD Previous Signature: 01/27/2021 11:10:14 AM Version By: [name] [name] , , Entered By: [name] on 01/27/2021 16:22:16 -------------------------------------------------------------------------------- HBO Safety Checklist Details Patient Name: Date of Service: [name], [name] 01/27/2021 8:00 A M Medical Record Number: [id number] Patient Account Number: [id number] Date of Birth/Sex: Treating RN: June 17, 1970 (50 y.o. [name], [name] Primary Care Derald Lorge: [name] Other Clinician: [name] Referring Eilee Schader: Treating Cuinn Westerhold/Extender: [name], [name] in Treatment: 20 HBO Safety Checklist Items Safety Checklist Consent Form Signed Patient voided / foley secured and emptied When did you last eato 0700 Last dose of injectable or oral agent 0630 Ostomy pouch emptied and vented if applicable NA All implantable devices assessed, documented and approved NA Intravenous access site secured and place NA Valuables secured Linens and cotton and cotton/polyester blend (less than 51% polyester) Personal oil-based products / skin lotions / body lotions removed Wigs or hairpieces removed NA Smoking or tobacco materials removed NA Books / newspapers / magazines / loose paper removed Cologne, aftershave, perfume and deodorant removed Jewelry removed (may wrap wedding band) NA Make-up removed NA Hair care products removed Battery operated devices (external) removed Heating patches and chemical warmers removed Titanium eyewear removed NA Nail polish cured greater than 10  hours NA Casting material cured greater than 10 hours NA Hearing aids removed NA Loose dentures or partials removed NA Prosthetics have been removed NA Patient demonstrates correct use of air break device (if applicable) Patient concerns have been addressed Patient grounding bracelet on and cord attached to chamber  Specifics for Inpatients (complete in addition to above) Medication sheet sent with patient NA Intravenous medications needed or due during therapy sent with patient NA Drainage tubes (e.g. nasogastric tube or chest tube secured and vented) NA Endotracheal or Tracheotomy tube secured NA Cuff deflated of air and inflated with saline NA Airway suctioned NA Notes Paper version used prior to treatment. Electronic Signature(s) Signed: 01/27/2021 11:11:51 AM By: [name] [name] , , Previous Signature: 01/27/2021 8:44:43 AM Version By: [name] [name] , , Entered By: [name] on 01/27/2021 11:11:50

## 2021-01-27 NOTE — Progress Notes (Signed)
ANTERIO, SCHEEL ([id number]) Visit Report for 01/22/2021 SuperBill Details Patient Name: Date of Service: [place] RD, [name] 01/22/2021 Medical Record Number: [id number] Patient Account Number: [id number] Date of Birth/Sex: Treating RN: 10-18-1970 (50 y.o. [name], [name] Primary Care Provider: [name] Other Clinician: [name] Referring Provider: Treating Provider/Extender: [name], [name] in Treatment: 19 Diagnosis Coding ICD-10 Codes Code Description [phone] Other chronic osteomyelitis, right ankle and foot E11.621 Type 2 diabetes mellitus with foot ulcer L97.514 Non-pressure chronic ulcer of other part of right foot with necrosis of bone T81.31XS Disruption of external operation (surgical) wound, not elsewhere classified, sequela F17.218 Nicotine dependence, cigarettes, with other nicotine-induced disorders L97.522 Non-pressure chronic ulcer of other part of left foot with fat layer exposed Facility Procedures CPT4 Code Description Modifier Quantity [id number] G0277-(Facility Use Only) HBOT full body chamber, 40min , 4 ICD-10 Diagnosis Description M86.671 Other chronic osteomyelitis, right ankle and foot E11.621 Type 2 diabetes mellitus with foot ulcer L97.514 Non-pressure chronic ulcer of other part of right foot with necrosis of bone T81.31XS Disruption of external operation (surgical) wound, not elsewhere classified, sequela Physician Procedures Quantity CPT4 Code Description Modifier [id number] 07371 - WC PHYS HYPERBARIC OXYGEN THERAPY 1 ICD-10 Diagnosis Description M86.671 Other chronic osteomyelitis, right ankle and foot E11.621 Type 2 diabetes mellitus with foot ulcer L97.514 Non-pressure chronic ulcer of other part of right foot with necrosis of bone T81.31XS Disruption of external operation (surgical) wound, not elsewhere classified, sequela Electronic Signature(s) Signed: 01/22/2021 11:15:58 AM By: [name] CHT EMT  BS , , Signed: 01/27/2021 4:29:54 PM By: [name] MD Entered By: [name] on 01/22/2021 11:15:57

## 2021-01-28 ENCOUNTER — Other Ambulatory Visit: Payer: Self-pay

## 2021-01-28 ENCOUNTER — Encounter (HOSPITAL_BASED_OUTPATIENT_CLINIC_OR_DEPARTMENT_OTHER): Payer: 59 | Admitting: Physician Assistant

## 2021-01-28 DIAGNOSIS — E11621 Type 2 diabetes mellitus with foot ulcer: Secondary | ICD-10-CM | POA: Diagnosis not present

## 2021-01-28 LAB — GLUCOSE, CAPILLARY
Glucose-Capillary: 275 mg/dL — ABNORMAL HIGH (ref 70–99)
Glucose-Capillary: 123 mg/dL — ABNORMAL HIGH (ref 70–99)

## 2021-01-28 NOTE — Progress Notes (Signed)
[name], [name] ([id number]) Visit Report for 01/28/2021 Arrival Information Details Patient Name: Date of Service: [place] RD, [name] 01/28/2021 10:30 A M Medical Record Number: [id number] Patient Account Number: [id number] Date of Birth/Sex: Treating RN: May 25, 1970 (50 y.o. [name], [name] Primary Care [name]: [name] Other Clinician: Referring [name]: Treating [name]/Extender: [name], [name] in Treatment: 20 Visit Information History Since Last Visit Added or deleted any medications: No Patient Arrived: Ambulatory Any new allergies or adverse reactions: No Arrival Time: 11:04 Had a fall or experienced change in No Accompanied By: self activities of daily living that may affect Transfer Assistance: None risk of falls: Patient Identification Verified: Yes Signs or symptoms of abuse/neglect since last visito No Secondary Verification Process Completed: Yes Hospitalized since last visit: No Patient Requires Transmission-Based Precautions: No Implantable device outside of the clinic excluding No Patient Has Alerts: No cellular tissue based products placed in the center since last visit: Has Dressing in Place as Prescribed: Yes Pain Present Now: Yes Electronic Signature(s) Signed: 01/28/2021 5:50:27 PM By: Baruch Gouty RN, BSN Entered By: Baruch Gouty on 01/28/2021 11:05:33 -------------------------------------------------------------------------------- Encounter Discharge Information Details Patient Name: Date of Service: [name] RD, [place]. 01/28/2021 10:30 A M Medical Record Number: [id number] Patient Account Number: [id number] Date of Birth/Sex: Treating RN: 1970/10/31 (50 y.o. [name] Primary Care Jaselynn Tamas: [name] Other Clinician: Referring Marjarie Irion: Treating Ann Bohne/Extender: [name], [name] in Treatment: 20 Encounter Discharge Information Items Post Procedure Vitals Discharge Condition:  Stable Temperature (F): 97.9 Ambulatory Status: Cane Pulse (bpm): 88 Discharge Destination: Home Respiratory Rate (breaths/min): 18 Transportation: Private Auto Blood Pressure (mmHg): 119/76 Accompanied By: self Schedule Follow-up Appointment: Yes Clinical Summary of Care: Patient Declined Electronic Signature(s) Signed: 01/28/2021 5:50:27 PM By: Baruch Gouty RN, BSN Entered By: Baruch Gouty on 01/28/2021 12:13:53 -------------------------------------------------------------------------------- Lower Extremity Assessment Details Patient Name: Date of Service: [name], [name] 01/28/2021 10:30 A M Medical Record Number: [id number] Patient Account Number: [id number] Date of Birth/Sex: Treating RN: 12-16-1970 (50 y.o. [name] Primary Care AmeLie Hollars: [name] Other Clinician: Referring [name]: Treating Shantrice Rodenberg/Extender: [name], [name] in Treatment: 20 Edema Assessment Assessed: [Left: No] [Right: No] Edema: [Left: No] [Right: No] Calf Left: Right: Point of Measurement: From Medial Instep 36 cm 34.3 cm Ankle Left: Right: Point of Measurement: From Medial Instep 22 cm 22 cm Vascular Assessment Pulses: Dorsalis Pedis Palpable: [Left:Yes] [Right:No] Electronic Signature(s) Signed: 01/28/2021 5:50:27 PM By: Baruch Gouty RN, BSN Entered By: Baruch Gouty on 01/28/2021 11:09:20 -------------------------------------------------------------------------------- [place] Details Patient Name: Date of Service: [name] RD, [name] 01/28/2021 10:30 A M Medical Record Number: [id number] Patient Account Number: [id number] Date of Birth/Sex: Treating RN: March 21, 1971 (50 y.o. [name] Primary Care Almus [name]: [name] Other Clinician: Referring [name]: Treating [name]/Extender: [name], [name] in Treatment: 20 Active Inactive HBO Nursing Diagnoses: Anxiety related to feelings  of confinement associated with the hyperbaric oxygen chamber Anxiety related to knowledge deficit of hyperbaric oxygen therapy and treatment procedures Potential for barotraumas to ears, sinuses, teeth, and lungs or cerebral gas embolism related to changes in atmospheric pressure inside hyperbaric oxygen chamber Potential for oxygen toxicity seizures related to delivery of 100% oxygen at an increased atmospheric pressure Potential for pulmonary oxygen toxicity related to delivery of 100% oxygen at an increased atmospheric pressure Goals: Barotrauma will be prevented during HBO2 Date Initiated: 01/28/2021 Target Resolution Date: 02/25/2021 Goal Status: Active Patient and/or  family will be able to state/discuss factors appropriate to the management of their disease process during treatment Date Initiated: 01/28/2021 Target Resolution Date: 02/25/2021 Goal Status: Active Patient will tolerate the hyperbaric oxygen therapy treatment Date Initiated: 01/28/2021 Target Resolution Date: 02/25/2021 Goal Status: Active Interventions: Administer the correct therapeutic gas delivery based on the patients needs and limitations, per physician order Assess and provide for patients comfort related to the hyperbaric environment and equalization of middle ear Assess for signs and symptoms related to adverse events, including but not limited to confinement anxiety, pneumothorax, oxygen toxicity and baurotrauma Notes: Osteomyelitis Nursing Diagnoses: Infection: osteomyelitis Knowledge deficit related to disease process and management Goals: Patient/caregiver will verbalize understanding of disease process and disease management Date Initiated: 11/26/2020 Target Resolution Date: 02/24/2021 Goal Status: Active Patient's osteomyelitis will resolve Date Initiated: 11/26/2020 Target Resolution Date: 02/24/2021 Goal Status: Active Interventions: Assess for signs and symptoms of osteomyelitis resolution every  visit Provide education on osteomyelitis Screen for HBO Treatment Activities: Systemic antibiotics : 11/26/2020 Notes: Wound/Skin Impairment Nursing Diagnoses: Impaired tissue integrity Goals: Patient/caregiver will verbalize understanding of skin care regimen Date Initiated: 09/05/2020 Target Resolution Date: 02/24/2021 Goal Status: Active Ulcer/skin breakdown will have a volume reduction of 30% by week 4 Date Initiated: 09/05/2020 Date Inactivated: 10/08/2020 Target Resolution Date: 10/03/2020 Goal Status: Met Interventions: Assess patient/caregiver ability to obtain necessary supplies Assess patient/caregiver ability to perform ulcer/skin care regimen upon admission and as needed Assess ulceration(s) every visit Provide education on ulcer and skin care Treatment Activities: Patient referred to home care : 09/05/2020 Skin care regimen initiated : 09/05/2020 Topical wound management initiated : 09/05/2020 Notes: Electronic Signature(s) Signed: 01/28/2021 5:50:27 PM By: Baruch Gouty RN, BSN Entered By: Baruch Gouty on 01/28/2021 11:23:52 -------------------------------------------------------------------------------- Negative Pressure Wound Therapy Application (NPWT) Details Patient Name: Date of Service: [place] RD, [name] 01/28/2021 10:30 A M Medical Record Number: [id number] Patient Account Number: [id number] Date of Birth/Sex: Treating RN: 1970/11/28 (50 y.o. [name] Primary Care Nicolina Hirt: [name] Other Clinician: Referring Renleigh Ouellet: Treating Anthonia Monger/Extender: [name], [name] in Treatment: 20 NPWT Application Performed for: Wound #7 Right, Lateral, Plantar Foot Additional Injuries Covered: Yes Additional Injuries: Wound#3 Right, Proximal, Lateral Foot Performed By: Baruch Gouty, RN Type: VAC System Coverage Size (sq cm): 0.5 Pressure Type: Constant Pressure Setting: 125 mmHG Drain Type: None Quantity of Sponges/Gauze  Inserted: 2 Sponge/Dressing Type: Gauze Date Initiated: 01/28/2021 Response to Treatment: good Post Procedure Diagnosis Same as Pre-procedure Electronic Signature(s) Signed: 01/28/2021 5:50:27 PM By: Baruch Gouty RN, BSN Entered By: Baruch Gouty on 01/28/2021 11:31:52 -------------------------------------------------------------------------------- Pain Assessment Details Patient Name: Date of Service: [name] RD, [place]. 01/28/2021 10:30 A M Medical Record Number: [id number] Patient Account Number: [id number] Date of Birth/Sex: Treating RN: 10-17-1970 (50 y.o. [name] Primary Care Harveen Flesch: [name] Other Clinician: Referring Mariaceleste Herrera: Treating Raynold Blankenbaker/Extender: [name], [name] in Treatment: 20 Active Problems Location of Pain Severity and Description of Pain Patient Has Paino Yes Site Locations Pain Location: Pain in Ulcers With Dressing Change: Yes Duration of the Pain. Constant / Intermittento Intermittent Rate the pain. Current Pain Level: 1 Worst Pain Level: 4 Least Pain Level: 0 Character of Pain Describe the Pain: Tender Pain Management and Medication Current Pain Management: Rest: Yes Is the Current Pain Management Adequate: Adequate How does your wound impact your activities of daily livingo Sleep: No Bathing: No Appetite: No Relationship With Others: No Bladder Continence: No Emotions: No Bowel Continence: No Work: No  Toileting: No Drive: No Dressing: No Hobbies: No Electronic Signature(s) Signed: 01/28/2021 5:50:27 PM By: Baruch Gouty RN, BSN Entered By: Baruch Gouty on 01/28/2021 11:07:10 -------------------------------------------------------------------------------- Patient/Caregiver Education Details Patient Name: Date of Service: [name] RD, [place] 11/2/2022andnbsp10:30 A M Medical Record Number: [id number] Patient Account Number: [id number] Date of Birth/Gender: Treating RN: [birth year] (50  y.o. [name] Primary Care Physician: [name] Other Clinician: Referring Physician: Treating Physician/Extender: [name], [name] in Treatment: 20 Education Assessment Education Provided To: Patient Education Topics Provided Hyperbaric Oxygenation: Methods: Explain/Verbal Responses: Reinforcements needed, State content correctly Infection: Methods: Explain/Verbal Responses: Reinforcements needed, State content correctly Offloading: Methods: Explain/Verbal Responses: Reinforcements needed, State content correctly Wound/Skin Impairment: Methods: Explain/Verbal Responses: Reinforcements needed, State content correctly Electronic Signature(s) Signed: 01/28/2021 5:50:27 PM By: Baruch Gouty RN, BSN Entered By: Baruch Gouty on 01/28/2021 11:25:39 -------------------------------------------------------------------------------- Wound Assessment Details Patient Name: Date of Service: [name] RD, [name] 01/28/2021 10:30 A M Medical Record Number: [id number] Patient Account Number: [id number] Date of Birth/Sex: Treating RN: 07-05-1970 (50 y.o. [name] Primary Care Karlita Lichtman: [name] Other Clinician: Referring Martell Mcfadyen: Treating Yiannis Tulloch/Extender: [name], [name] in Treatment: 20 Wound Status Wound Number: 3 Primary Diabetic Wound/Ulcer of the Lower Extremity Etiology: Wound Location: Right, Proximal, Lateral Foot Wound Open Wounding Event: Gradually Appeared Status: Date Acquired: 10/29/2020 Comorbid Anemia, Coronary Artery Disease, Hypertension, Type II [name] Of Treatment: 13 History: Diabetes, Osteomyelitis, Neuropathy Clustered Wound: No Photos Wound Measurements Length: (cm) 0.5 Width: (cm) 0.5 Depth: (cm) 3 Area: (cm) 0.196 Volume: (cm) 0.589 % Reduction in Area: -55.6% % Reduction in Volume: -569.3% Epithelialization: None Tunneling: No Undermining: Yes Starting Position (o'clock):  6 Ending Position (o'clock): 9 Maximum Distance: (cm) 1.4 Wound Description Classification: Grade 3 Wound Margin: Well defined, not attached Exudate Amount: Medium Exudate Type: Sanguinous Exudate Color: red Foul Odor After Cleansing: No Slough/Fibrino Yes Wound Bed Granulation Amount: Large (67-100%) Exposed Structure Granulation Quality: Red Fascia Exposed: No Necrotic Amount: None Present (0%) Fat Layer (Subcutaneous Tissue) Exposed: Yes Tendon Exposed: No Muscle Exposed: No Joint Exposed: No Bone Exposed: No Treatment Notes Wound #3 (Foot) Wound Laterality: Right, Lateral, Proximal Cleanser Soap and Water Discharge Instruction: May shower and wash wound with dial antibacterial soap and water prior to dressing change. Wound Cleanser Discharge Instruction: Cleanse the wound with wound cleanser prior to applying a clean dressing using gauze sponges, not tissue or cotton balls. Peri-Wound Care Sween Lotion (Moisturizing lotion) Discharge Instruction: Apply moisturizing lotion to foot with dressing changes Topical Primary Dressing VAC Secondary Dressing Secured With Coban Self-Adherent Wrap 4x5 (in/yd) Discharge Instruction: Secure with Coban as directed. Kerlix Roll Sterile, 4.5x3.1 (in/yd) Discharge Instruction: Secure with Kerlix as directed. Compression Wrap Compression Stockings Add-Ons Electronic Signature(s) Signed: 01/28/2021 5:50:27 PM By: Baruch Gouty RN, BSN Entered By: Baruch Gouty on 01/28/2021 11:12:45 -------------------------------------------------------------------------------- Wound Assessment Details Patient Name: Date of Service: [name] RD, [name] 01/28/2021 10:30 A M Medical Record Number: [id number] Patient Account Number: [id number] Date of Birth/Sex: Treating RN: 1970/04/19 (50 y.o. [name] Primary Care Lucca Ballo: [name] Other Clinician: Referring Lucia Harm: Treating Karessa Onorato/Extender: [name],  [name] in Treatment: 20 Wound Status Wound Number: 7 Primary Diabetic Wound/Ulcer of the Lower Extremity Etiology: Wound Location: Right, Lateral, Plantar Foot Wound Open Wounding Event: Gradually Appeared Status: Date Acquired: 12/31/2020 Comorbid Anemia, Coronary Artery Disease, Hypertension, Type II [name] Of Treatment: 4 History: Diabetes, Osteomyelitis, Neuropathy Clustered Wound: No Photos Wound Measurements Length: (  cm) 0.5 Width: (cm) 0.5 Depth: (cm) 1.1 Area: (cm) 0.196 Volume: (cm) 0.216 % Reduction in Area: 0% % Reduction in Volume: -10.2% Epithelialization: None Undermining: Yes Starting Position (o'clock): 12 Ending Position (o'clock): 6 Maximum Distance: (cm) 1.8 Wound Description Classification: Grade 3 Wound Margin: Well defined, not attached Exudate Amount: Medium Exudate Type: Sanguinous Exudate Color: red Foul Odor After Cleansing: No Slough/Fibrino Yes Wound Bed Granulation Amount: Large (67-100%) Exposed Structure Granulation Quality: Red Fascia Exposed: No Necrotic Amount: None Present (0%) Fat Layer (Subcutaneous Tissue) Exposed: Yes Tendon Exposed: No Muscle Exposed: No Joint Exposed: No Bone Exposed: No Treatment Notes Wound #7 (Foot) Wound Laterality: Plantar, Right, Lateral Cleanser Soap and Water Discharge Instruction: May shower and wash wound with dial antibacterial soap and water prior to dressing change. Wound Cleanser Discharge Instruction: Cleanse the wound with wound cleanser prior to applying a clean dressing using gauze sponges, not tissue or cotton balls. Peri-Wound Care Sween Lotion (Moisturizing lotion) Discharge Instruction: Apply moisturizing lotion to foot with dressing changes Topical Primary Dressing VAC Secondary Dressing Secured With Coban Self-Adherent Wrap 4x5 (in/yd) Discharge Instruction: Secure with Coban as directed. Kerlix Roll Sterile, 4.5x3.1 (in/yd) Discharge Instruction: Secure with Kerlix as  directed. Compression Wrap Compression Stockings Add-Ons Electronic Signature(s) Signed: 01/28/2021 5:50:27 PM By: Baruch Gouty RN, BSN Entered By: Baruch Gouty on 01/28/2021 11:15:02 -------------------------------------------------------------------------------- Wound Assessment Details Patient Name: Date of Service: [name] RD, [name] 01/28/2021 10:30 A M Medical Record Number: [id number] Patient Account Number: [id number] Date of Birth/Sex: Treating RN: 09/22/1970 (50 y.o. [name] Primary Care Hermenia Fritcher: [name] Other Clinician: Referring Zaydon Kinser: Treating Khristian Phillippi/Extender: [name], [name] in Treatment: 20 Wound Status Wound Number: 8 Primary Trauma, Other Etiology: Wound Location: Right, Anterior Lower Leg Wound Open Wounding Event: Trauma Status: Date Acquired: 01/16/2021 Comorbid Anemia, Coronary Artery Disease, Hypertension, Type II [name] Of Treatment: 1 History: Diabetes, Osteomyelitis, Neuropathy Clustered Wound: No Photos Wound Measurements Length: (cm) 0.7 Width: (cm) 0.3 Depth: (cm) 0.1 Area: (cm) 0.165 Volume: (cm) 0.016 % Reduction in Area: 80% % Reduction in Volume: 90.3% Epithelialization: Large (67-100%) Tunneling: No Undermining: No Wound Description Classification: Full Thickness Without Exposed Support Structures Wound Margin: Distinct, outline attached Exudate Amount: Small Exudate Type: Serosanguineous Exudate Color: red, brown Foul Odor After Cleansing: No Slough/Fibrino Yes Wound Bed Granulation Amount: Medium (34-66%) Exposed Structure Granulation Quality: Red Fascia Exposed: No Necrotic Amount: Medium (34-66%) Fat Layer (Subcutaneous Tissue) Exposed: Yes Necrotic Quality: Adherent Slough Tendon Exposed: No Muscle Exposed: No Joint Exposed: No Bone Exposed: No Treatment Notes Wound #8 (Lower Leg) Wound Laterality: Right, Anterior Cleanser Soap and Water Discharge Instruction: May  shower and wash wound with dial antibacterial soap and water prior to dressing change. Wound Cleanser Discharge Instruction: Cleanse the wound with wound cleanser prior to applying a clean dressing using gauze sponges, not tissue or cotton balls. Peri-Wound Care Topical Primary Dressing Hydrofera Blue Classic Foam, 2x2 in Discharge Instruction: Moisten with saline prior to applying to wound bed Secondary Dressing Zetuvit Plus Silicone Border Dressing 4x4 (in/in) Discharge Instruction: Apply silicone border over primary dressing as directed. Secured With Compression Wrap Compression Stockings Environmental education officer) Signed: 01/28/2021 5:50:27 PM By: Baruch Gouty RN, BSN Entered By: Baruch Gouty on 01/28/2021 11:17:05 -------------------------------------------------------------------------------- Vitals Details Patient Name: Date of Service: [name] RD, [name] 01/28/2021 10:30 A M Medical Record Number: [id number] Patient Account Number: [id number] Date of Birth/Sex: Treating RN: [birth year] (50 y.o. [name] Primary Care Donnae Michels: Minette Brine, [place]  Other Clinician: Referring Octa Uplinger: Treating Livie Vanderhoof/Extender: [name], [name] in Treatment: 20 Vital Signs Time Taken: 11:05 Temperature (F): 97.7 Height (in): 65 Pulse (bpm): 88 Source: Stated Respiratory Rate (breaths/min): 16 Weight (lbs): 196 Blood Pressure (mmHg): 119/76 Source: Stated Capillary Blood Glucose (mg/dl): 123 Body Mass Index (BMI): 32.6 Reference Range: 80 - 120 mg / dl Notes VS see HBO encounter Electronic Signature(s) Signed: 01/28/2021 11:24:16 AM By: [name] [name] , , Entered By: [name] on 01/28/2021 11:24:15

## 2021-01-28 NOTE — Progress Notes (Addendum)
EBONY, RICKEL ([id number]) Visit Report for 01/28/2021 Arrival Information Details Patient Name: Date of Service: [name] RD, [name] 01/28/2021 8:00 A M Medical Record Number: [id number] Patient Account Number: [id number] Date of Birth/Sex: Treating RN: 1970-06-02 (50 y.o. [name], [name] Primary Care Jaymarion Trombly: [name] Other Clinician: [name] Referring Saidi Santacroce: Treating Trenika Hudson/Extender: [name], [name] in Treatment: 20 Visit Information History Since Last Visit All ordered tests and consults were completed: Yes Patient Arrived: [name] Added or deleted any medications: No Arrival Time: 07:35 Any new allergies or adverse reactions: No Accompanied By: self Had a fall or experienced change in No Transfer Assistance: None activities of daily living that may affect Patient Identification Verified: Yes risk of falls: Secondary Verification Process Completed: Yes Signs or symptoms of abuse/neglect since last visito No Patient Requires Transmission-Based Precautions: No Hospitalized since last visit: No Patient Has Alerts: No Implantable device outside of the clinic excluding No cellular tissue based products placed in the center since last visit: Pain Present Now: No Electronic Signature(s) Signed: 01/28/2021 10:17:02 AM By: [name] [name] , , Entered By: [name] on 01/28/2021 10:17:02 -------------------------------------------------------------------------------- Encounter Discharge Information Details Patient Name: Date of Service: [name], [place]. 01/28/2021 8:00 A M Medical Record Number: [id number] Patient Account Number: [id number] Date of Birth/Sex: Treating RN: [birth year] (50 y.o. [name] Primary Care Brenee Gajda: [name] Other Clinician: [name] Referring Chanice Brenton: Treating Destinae Neubecker/Extender: [name], [name] in Treatment: 20 Encounter Discharge  Information Items Discharge Condition: Stable Ambulatory Status: Cane Discharge Destination: Other (Note Required) Transportation: Other Accompanied By: staff Schedule Follow-up Appointment: No Clinical Summary of Care: Notes Wound Care encounter after treatment Electronic Signature(s) Signed: 01/28/2021 11:21:47 AM By: [name] [name] , , Entered By: [name] on 01/28/2021 11:21:47 -------------------------------------------------------------------------------- [place] Details Patient Name: Date of Service: [name], [place]. 01/28/2021 8:00 A M Medical Record Number: [id number] Patient Account Number: [id number] Date of Birth/Sex: Treating RN: 04/30/1970 (50 y.o. [name] Primary Care Alanna Storti: [name] Other Clinician: [name] Referring Wenda Vanschaick: Treating Lakia Gritton/Extender: [name], [name] in Treatment: 20 Vital Signs Time Taken: 08:18 Temperature (F): 98.1 Weight (lbs): 188 Pulse (bpm): 99 Respiratory Rate (breaths/min): 16 Blood Pressure (mmHg): 141/75 Capillary Blood Glucose (mg/dl): 275 Reference Range: 80 - 120 mg / dl Airway Pulse Oximetry (%): 100 Electronic Signature(s) Signed: 01/28/2021 11:15:23 AM By: [name] [name] , , Entered By: [name] on 01/28/2021 11:15:23

## 2021-01-28 NOTE — Progress Notes (Addendum)
NAJEEB, UPTAIN ([id number]) Visit Report for 01/28/2021 HBO Details Patient Name: Date of Service: [address], [place]. 01/28/2021 8:00 A M Medical Record Number: [id number] Patient Account Number: [id number] Date of Birth/Sex: Treating RN: 1970-07-22 (50 y.o. [name] Primary Care Jerid Catherman: [name] Other Clinician: [name] Referring Brazen Domangue: Treating Henna Derderian/Extender: [name], [name] in Treatment: 20 HBO Treatment Course Details Treatment Course Number: 1 Ordering Clevon Khader: [name] Treatments Ordered: otal 40 HBO Treatment Start Date: 12/08/2020 HBO Indication: Diabetic Ulcer(s) of the Lower Extremity HBO Treatment Details Treatment Number: 24 Patient Type: Outpatient Chamber Type: Monoplace Chamber Serial #: [id number] Treatment Protocol: 2.5 ATA with 90 minutes oxygen, with two 5 minute air breaks Treatment Details Compression Rate Down: 2.0 psi / minute De-Compression Rate Up: 2.5 psi / minute A breaks and breathing ir Compress Tx Pressure periods Decompress Decompress Begins Reached (leave unused spaces Begins Ends blank) Chamber Pressure (ATA 1 2.5 2.5 2.5 2.5 2.5 - - 2.5 1 ) Clock Time (24 hr) 08:20 08:31 09:01 09:06 09:36 09:41 - - 10:11 10:18 Treatment Length: 118 (minutes) Treatment Segments: 4 Vital Signs Capillary Blood Glucose Reference Range: 80 - 120 mg / dl HBO Diabetic Blood Glucose Intervention Range: <131 mg/dl or >249 mg/dl Type: Time Vitals Blood Pulse: Respiratory Temperature: Capillary Blood Glucose Pulse Action Taken: Pressure: Rate: Glucose (mg/dl): Meter #: Oximetry (%) Taken: Pre 08:18 141/75 99 16 98.1 275 100 Post 10:22 119/76 88 16 97.7 123 discharge per protocol >101 mg/dL Treatment Response Treatment Toleration: Well Treatment Completion Status: Treatment Completed without Adverse Event Additional Procedure Documentation Tissue Sevierity: Fat layer exposed Electronic  Signature(s) Signed: 01/28/2021 11:19:42 AM By: [name] [name] , , Signed: 01/28/2021 6:26:15 PM By: Worthy Keeler PA-C Entered By: [name] on 01/28/2021 11:19:41 -------------------------------------------------------------------------------- HBO Safety Checklist Details Patient Name: Date of Service: [name], [place]. 01/28/2021 8:00 A M Medical Record Number: [id number] Patient Account Number: [id number] Date of Birth/Sex: Treating RN: Apr 07, 1970 (50 y.o. [name] Primary Care Ramone Gander: [name] Other Clinician: [name] Referring Thursa Emme: Treating Corydon Schweiss/Extender: [name], [name] in Treatment: 20 HBO Safety Checklist Items Safety Checklist Consent Form Signed Patient voided / foley secured and emptied When did you last eato breakfast Last dose of injectable or oral agent 0630 Ostomy pouch emptied and vented if applicable NA All implantable devices assessed, documented and approved NA Intravenous access site secured and place NA Valuables secured Linens and cotton and cotton/polyester blend (less than 51% polyester) Personal oil-based products / skin lotions / body lotions removed Wigs or hairpieces removed NA Smoking or tobacco materials removed Books / newspapers / magazines / loose paper removed Cologne, aftershave, perfume and deodorant removed Jewelry removed (may wrap wedding band) Make-up removed NA Hair care products removed Battery operated devices (external) removed Heating patches and chemical warmers removed Titanium eyewear removed NA Nail polish cured greater than 10 hours NA Casting material cured greater than 10 hours NA Hearing aids removed NA Loose dentures or partials removed NA Prosthetics have been removed NA Patient demonstrates correct use of air break device (if applicable) Patient concerns have been addressed Patient grounding bracelet on and cord attached to  chamber Specifics for Inpatients (complete in addition to above) Medication sheet sent with patient NA Intravenous medications needed or due during therapy sent with patient NA Drainage tubes (e.g. nasogastric tube or chest tube secured and vented) NA Endotracheal or Tracheotomy tube secured  NA Cuff deflated of air and inflated with saline NA Airway suctioned NA Notes Paper version used prior to treatment Electronic Signature(s) Signed: 01/28/2021 11:17:00 AM By: [name] [name] , , Entered By: [name] on 01/28/2021 11:16:59

## 2021-01-28 NOTE — Progress Notes (Signed)
[name], [name] ([id number]) Visit Report for 01/28/2021 SuperBill Details Patient Name: Date of Service: [place] [address], [name] 01/28/2021 Medical Record Number: [id number] Patient Account Number: [id number] Date of Birth/Sex: Treating RN: Aug 07, 1970 (50 y.o. [name] Primary Care Provider: [name] Other Clinician: [name] Referring Provider: Treating Provider/Extender: [name], [name] in Treatment: 20 Diagnosis Coding ICD-10 Codes Code Description [phone] Other chronic osteomyelitis, right ankle and foot E11.621 Type 2 diabetes mellitus with foot ulcer L97.514 Non-pressure chronic ulcer of other part of right foot with necrosis of bone T81.31XS Disruption of external operation (surgical) wound, not elsewhere classified, sequela F17.218 Nicotine dependence, cigarettes, with other nicotine-induced disorders L97.522 Non-pressure chronic ulcer of other part of left foot with fat layer exposed Facility Procedures CPT4 Code Description Modifier Quantity [id number]-(Facility Use Only) HBOT full body chamber, 74min , 4 ICD-10 Diagnosis Description M86.671 Other chronic osteomyelitis, right ankle and foot E11.621 Type 2 diabetes mellitus with foot ulcer L97.514 Non-pressure chronic ulcer of other part of right foot with necrosis of bone Physician Procedures Quantity CPT4 Code Description Modifier [id number] - WC PHYS HYPERBARIC OXYGEN THERAPY 1 ICD-10 Diagnosis Description M86.671 Other chronic osteomyelitis, right ankle and foot E11.621 Type 2 diabetes mellitus with foot ulcer L97.514 Non-pressure chronic ulcer of other part of right foot with necrosis of bone Electronic Signature(s) Signed: 01/28/2021 11:20:26 AM By: [name] [name] , , Signed: 01/28/2021 6:26:15 PM By: Worthy Keeler PA-C Entered By: [name] on 01/28/2021 11:20:24

## 2021-01-28 NOTE — Progress Notes (Addendum)
ENRRIQUE, MIERZWA ([id number]) Visit Report for 01/28/2021 Chief Complaint Document Details Patient Name: Date of Service: [place] [address], [name] 01/28/2021 10:30 A M Medical Record Number: [id number] Patient Account Number: [id number] Date of Birth/Sex: Treating RN: [birth year] (50 y.o. [name] Primary Care Provider: [name] Other Clinician: Referring Provider: Treating Provider/Extender: [name], [name] in Treatment: 20 Information Obtained from: Patient Chief Complaint 09/05/2020; patient is here for review of the wound extensively on a right TMA amputation site. He also has a wound on the plantar aspect of the left foot Electronic Signature(s) Signed: 01/28/2021 10:13:38 AM By: Worthy Keeler PA-C Entered By: Worthy Keeler on 01/28/2021 10:13:38 -------------------------------------------------------------------------------- Debridement Details Patient Name: Date of Service: [name], [place] [name] 01/28/2021 10:30 A M Medical Record Number: [id number] Patient Account Number: [id number] Date of Birth/Sex: Treating RN: [birth year] (50 y.o. [name] Primary Care Provider: [name] Other Clinician: Referring Provider: Treating Provider/Extender: [name], [name] in Treatment: 20 Debridement Performed for Assessment: Wound #3 Right,Proximal,Lateral Foot Performed By: Physician Worthy Keeler, PA Debridement Type: Debridement Severity of Tissue Pre Debridement: Fat layer exposed Level of Consciousness (Pre-procedure): Awake and Alert Pre-procedure Verification/Time Out Yes - 11:20 Taken: Start Time: 11:38 Pain Control: Other : Benzocaine 20% T Area Debrided (L x W): otal 3 (cm) x 2 (cm) = 6 (cm) Tissue and other material debrided: Non-Viable, Callus, Skin: Epidermis Level: Skin/Epidermis Debridement Description: Selective/Open Wound Instrument: Curette Bleeding: Minimum Hemostasis Achieved:  Pressure Procedural Pain: 0 Post Procedural Pain: 0 Response to Treatment: Procedure was tolerated well Level of Consciousness (Post- Awake and Alert procedure): Post Debridement Measurements of Total Wound Length: (cm) 0.5 Width: (cm) 0.5 Depth: (cm) 3 Volume: (cm) 0.589 Character of Wound/Ulcer Post Debridement: Improved Severity of Tissue Post Debridement: Fat layer exposed Post Procedure Diagnosis Same as Pre-procedure Electronic Signature(s) Signed: 01/28/2021 5:50:27 PM By: Baruch Gouty RN, BSN Signed: 01/28/2021 6:26:15 PM By: Worthy Keeler PA-C Entered By: Baruch Gouty on 01/28/2021 11:41:57 -------------------------------------------------------------------------------- HPI Details Patient Name: Date of Service: [name], [place]. 01/28/2021 10:30 A M Medical Record Number: [id number] Patient Account Number: [id number] Date of Birth/Sex: Treating RN: 08/19/1970 (50 y.o. [name] Primary Care Provider: [name] Other Clinician: Referring Provider: Treating Provider/Extender: [name], [name] in Treatment: 20 History of Present Illness HPI Description: ADMISSION 09/05/2020 This is a 50 year old man who has type 2 diabetes. Essentially the problem began admitted to [facility] in December with infection gas gangrene. Cultures ultimately grew staph lugdunensis. 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 March Rummage. Shortly thereafter he developed a nonhealing wound with wound dehiscence noted on 05/09/2020. He had a wound VAC I think for a period of time after the surgery but it did not help. He has undergone an IandD and skin graft on the right foot on 05/16/2020. He underwent a further IandD on 06/04/2020 not debrided to bone. 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 Culture of the metatarsal grew Enterobacter cloacae  I. He has been followed by Dr. [name] of infectious disease. Currently on Levaquin and doxycycline I think at about the 4-week of 6 mark. At the last visit with Dr. March Rummage he was supposed to have use Santyl although I do not think he has it and he has been using I think a Betadine wet-to-dry. He is referred  here by Dr. [name] for our review who saw him yesterday. I cannot see that he has had imaging studies of the right foot. No MRI Past medical history includes type 2 diabetes, in 2021 and ulcer of the left foot that ultimately healed with a wound VAC, stage III chronic kidney disease, first toe amputations bilaterally, hypertension, still a 1/2 pack/day smoker. The patient had ABIs on 08/09/2018 which was 1.00 on the right and 0.96 on the left both areas had triphasic waveforms. Not felt to have an arterial issue. 09/12/2020; 1 week follow-up. He has been doing the dressings along with home health. This is a deep punched out area in the incision of his transmit site. We use silver alginate last week to help with drainage and antibacterial properties. He tolerated this well 6/24; the patient was actually at his podiatrist this morning who debrided the wound. We have been using silver alginate. They are going to follow him in 6 weeks. 6/29; patient is using silver alginate. Apparently saw Dr. March Rummage this week although I have not checked his note I will try to do so. He is following up in 6 weeks. This was the original transmetatarsal amputation when I first saw this 3 mid part of his amputation site was deep down to bone however this seems to have progressively close down which is gratifying to see. He still has a fairly callus uneven surface however spreading this apart its difficult to identify anything that does not look epithelialized. When I first saw this I thought he would require an extensive debridement perhaps back in the OR by Dr. March Rummage however all of this appears to be looking better and at this point  I would continue with the silver alginate see if this holds together over the next several weeks. He does not have an arterial issue 7/13; 2-week follow-up. Unfortunately the area did not hold together there was drainage on his dressing. Still copious amounts of callus. It was difficult to see where this actually was open therefore I elected to go ahead with a vigorous debridement. Still using silver alginate 7/20 I brought him back after extensive callus debridement last week. The only open area was on the medial part of the foot extensive debridement of thick surrounding callus and I was able to do identify the remaining tunneling area. 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 This is the area that we may have been following. This is smaller this week but still surrounded with thick callus He came in today with a large area of callus on the left midfoot. This I had noticed previously. HOWEVER our intake nurse clearly incorrectly documented this as opening. The callus and split there was an odor so all of this had to be removed. 10/29/2020 unfortunately upon evaluation today this patient appears to have significant mount of callus noted at this point. There does not appear to be any signs of active infection systemically which is great although locally he is having some discomfort around the lateral portion of his foot on the right. The left foot is no having a lot of callus I am not certain even has an open wound if there is anything is extremely tiny. 11/05/2020 upon evaluation today patient's wounds actually appear to be doing a bit better compared to last week since we have uncovered some of these areas I think there are some definite improvements. With that being said there  is still quite a bit of tunneling and undermining at many locations that again has me concerned I still think that the MRI is the best thing to do he has that  scheduled for Saturday. 11/26/2020 upon evaluation today patient appears to be doing decently well in regard to his foot in general. He did have an MRI I did review this and he had multiple findings on MRI consistent with osteomyelitis across the board in regard pretty much to his remaining metatarsal regions. Nonetheless there was also some evidence of some involvement of the navicular bone as well. Either way I think that this patient actually is in desperate need of get him in for hyperbaric oxygen therapy we discussed that today that will be detailed in the plan. Nonetheless he is also still on the doxycycline which I think is appropriate for the time being as well I did go ahead and place him on that following the results of the MRI based on what we were seeing. I gave him that prescription for 14 days for the time being and we will go from there. 12/03/2020 upon evaluation today patient appears to be doing decently well in regard to his wounds in general in regard to the right foot. With that being said he does have a callus on the left foot in the end there was actually an area open area here that will need to be addressed. This is small and I think we take care of it now hopefully will heal quite readily. Fortunately I do not see any signs of infection worsening I am getting need to refill the antibiotic for him today. He has been taking doxycycline which seems to be doing well. We will also get a go ahead and get him approved for HBO therapy. 12/31/2020 upon evaluation today patient appears to be doing really roughly the same as compared to last time I saw him. Fortunately there is no evidence of active infection which is great news systemically he has started hyperbarics he seems to be doing decently well he said a couple times where anxiety kind of got to him but overall I think he is doing awesome. He is no longer on the antibiotic therapy. With that being said I do believe that he is going to  need likely some additional antibiotics based on what I am seeing today we can actually obtain a bone culture from the plantar wound that was hiding underneath the callus. He was having pain in this area which is what may be try to clear away the callus to look sure enough there was an actual opening that went deeper into the wound bed region here. 01/21/2021 upon evaluation today patient appears to be doing about the same in regard to his foot in general. He does have some depth to some of the wound areas I really think he would benefit from is trying to see about getting him set up for a gauze VAC. I think that the gauze VAC could be helpful for him and will work much more effectively with what we are seeing as compared to traditional foam wound VAC. The patient is in agreement with giving this a trial. Nonetheless unfortunately think that he overall seems to be doing really well and I think we are headed in the right direction as far as the overall healing is concerned. 01/28/2021 upon evaluation today patient appears to be doing well with regard to his wound all things considered he does have a lot of  callus buildup especially on the plantar aspect of the left foot. There is no wound here but a significant amount of callus. That is getting need to be addressed today. Subsequently he also has a lot of callus on the right foot as well as some of the area around the wounds actually being significantly callused. Again I need to clear this away today. Electronic Signature(s) Signed: 01/28/2021 6:21:12 PM By: Worthy Keeler PA-C Entered By: Worthy Keeler on 01/28/2021 18:21:12 -------------------------------------------------------------------------------- Paring/cutting of benign hyperkeratotic lesion 2-4 Details Patient Name: Date of Service: [place] RD, [name] 01/28/2021 10:30 A M Medical Record Number: [id number] Patient Account Number: [id number] Date of Birth/Sex: Treating RN: [birth year]  (50 y.o. [name] Primary Care Provider: [name] Other Clinician: Referring Provider: Treating Provider/Extender: [name], [name] in Treatment: 20 Procedure Performed for: Non-Wound Location Performed By: Physician Worthy Keeler, PA Post Procedure Diagnosis Same as Pre-procedure Notes left plantar foot and right 5th met head using #3 curette Electronic Signature(s) Signed: 01/28/2021 5:50:27 PM By: Baruch Gouty RN, BSN Signed: 01/28/2021 6:26:15 PM By: Worthy Keeler PA-C Entered By: Baruch Gouty on 01/28/2021 11:34:32 -------------------------------------------------------------------------------- Physical Exam Details Patient Name: Date of Service: [name], [name] 01/28/2021 10:30 A M Medical Record Number: [id number] Patient Account Number: [id number] Date of Birth/Sex: Treating RN: 08-29-1970 (50 y.o. [name] Primary Care Provider: [name] Other Clinician: Referring Provider: Treating Provider/Extender: [name], [name] in Treatment: 42 Constitutional Well-nourished and well-hydrated in no acute distress. Respiratory normal breathing without difficulty. Psychiatric this patient is able to make decisions and demonstrates good insight into disease process. Alert and Oriented x 3. pleasant and cooperative. Notes Upon inspection patient's wound bed actually showed signs of good granulation and epithelization at this point for the most part. I think he is actually doing quite well and very pleased in that regard. Nonetheless I do believe that he is having a significant callus buildup I did perform a significant debridement of the callus on the left foot as well as the right foot today. He tolerated that without complication. These were all nonwound areas. Also to clean around some of the wound on the plantar foot right side in order to prepare the area for the gauze VAC. Electronic  Signature(s) Signed: 01/28/2021 6:21:57 PM By: Worthy Keeler PA-C Entered By: Worthy Keeler on 01/28/2021 18:21:57 -------------------------------------------------------------------------------- Physician Orders Details Patient Name: Date of Service: [name], [place]. 01/28/2021 10:30 A M Medical Record Number: [id number] Patient Account Number: [id number] Date of Birth/Sex: Treating RN: [birth year] (49 y.o. [name] Primary Care Provider: [name] Other Clinician: Referring Provider: Treating Provider/Extender: [name], [name] in Treatment: 20 Verbal / Phone Orders: No Diagnosis Coding ICD-10 Coding Code Description [phone] Other chronic osteomyelitis, right ankle and foot E11.621 Type 2 diabetes mellitus with foot ulcer L97.514 Non-pressure chronic ulcer of other part of right foot with necrosis of bone T81.31XS Disruption of external operation (surgical) wound, not elsewhere classified, sequela F17.218 Nicotine dependence, cigarettes, with other nicotine-induced disorders L97.522 Non-pressure chronic ulcer of other part of left foot with fat layer exposed Follow-up Appointments ppointment in 1 week. - with Margarita Grizzle after HBO Return A Nurse Visit: - Fri 11/4 and Mon 11/7 for [facility] change Bathing/ Shower/ Hygiene May shower with protection but do not get wound dressing(s) wet. Negative Presssure Wound Therapy Wound Vac to wound continuously at 136m/hg pressure Sterile Gauze Packing  Edema Control - Lymphedema / SCD / Other Elevate legs to the level of the heart or above for 30 minutes daily and/or when sitting, a frequency of: Avoid standing for long periods of time. Moisturize legs daily. - to foot with dressing changes Off-Loading Open toe surgical shoe to: - Wear at all times except driving. on right foot Other: - may wear shoe with memory foam insert to left foot, cut hole where callous is and insert in shoe to help offload Additional  Orders / Instructions Stop/Decrease Smoking Follow Nutritious Diet Non Wound Condition Other Non Wound Condition Orders/Instructions: - cushion bottom of left foot with orthopedic felt or foam [facility] [place] for wound care. May utilize formulary equivalent dressing for wound treatment orders unless otherwise specified. Hyperbaric Oxygen Therapy Indication: - wagner grade 3 diabetic foot ulcer of right foot If appropriate for treatment, begin HBOT per protocol: 2.5 ATA for 90 Minutes with 2 Five (5) Minute A Breaks ir Total Number of Treatments: - 40 One treatments per day (delivered Monday through Friday unless otherwise specified in Special Instructions below): Finger stick Blood Glucose Pre- and Post- HBOT Treatment. Follow Hyperbaric Oxygen Glycemia Protocol A frin (Oxymetazoline HCL) 0.05% nasal spray - 1 spray in both nostrils daily as needed prior to HBO treatment for difficulty clearing ears Wound Treatment Wound #3 - Foot Wound Laterality: Right, Lateral, Proximal Cleanser: Soap and Water 1 x Per Week/30 Days Discharge Instructions: May shower and wash wound with dial antibacterial soap and water prior to dressing change. Cleanser: Wound Cleanser 1 x Per Week/30 Days Discharge Instructions: Cleanse the wound with wound cleanser prior to applying a clean dressing using gauze sponges, not tissue or cotton balls. Peri-Wound Care: Sween Lotion (Moisturizing lotion) 1 x Per Week/30 Days Discharge Instructions: Apply moisturizing lotion to foot with dressing changes Prim Dressing: VAC ary 1 x Per Week/30 Days Secured With: Coban Self-Adherent Wrap 4x5 (in/yd) (Generic) 1 x Per Week/30 Days Discharge Instructions: Secure with Coban as directed. Secured With: [employer], 4.5x3.1 (in/yd) (Generic) 1 x Per Week/30 Days Discharge Instructions: Secure with Kerlix as directed. Wound #7 - Foot Wound Laterality: Plantar, Right, Lateral Cleanser: Soap and Water 1 x  Per Week/30 Days Discharge Instructions: May shower and wash wound with dial antibacterial soap and water prior to dressing change. Cleanser: Wound Cleanser 1 x Per Week/30 Days Discharge Instructions: Cleanse the wound with wound cleanser prior to applying a clean dressing using gauze sponges, not tissue or cotton balls. Peri-Wound Care: Sween Lotion (Moisturizing lotion) 1 x Per Week/30 Days Discharge Instructions: Apply moisturizing lotion to foot with dressing changes Prim Dressing: VAC ary 1 x Per Week/30 Days Secured With: Coban Self-Adherent Wrap 4x5 (in/yd) (Generic) 1 x Per Week/30 Days Discharge Instructions: Secure with Coban as directed. Secured With: [employer], 4.5x3.1 (in/yd) (Generic) 1 x Per Week/30 Days Discharge Instructions: Secure with Kerlix as directed. Wound #8 - Lower Leg Wound Laterality: Right, Anterior Cleanser: Soap and Water 1 x Per Week/30 Days Discharge Instructions: May shower and wash wound with dial antibacterial soap and water prior to dressing change. Cleanser: Wound Cleanser 1 x Per Week/30 Days Discharge Instructions: Cleanse the wound with wound cleanser prior to applying a clean dressing using gauze sponges, not tissue or cotton balls. Prim Dressing: Hydrofera Blue Classic Foam, 2x2 in 1 x Per Week/30 Days ary Discharge Instructions: Moisten with saline prior to applying to wound bed Secondary Dressing: Zetuvit Plus Silicone Border Dressing 4x4 (in/in)  1 x Per Week/30 Days Discharge Instructions: Apply silicone border over primary dressing as directed. GLYCEMIA INTERVENTIONS PROTOCOL PRE-HBO GLYCEMIA INTERVENTIONS ACTION INTERVENTION Obtain pre-HBO capillary blood glucose (ensure 1 physician order is in chart). A. Notify HBO physician and await physician orders. 2 If result is 70 mg/dl or below: B. If the result meets the hospital definition of a critical result, follow hospital policy. A. Give patient an 8 ounce Glucerna Shake,  an 8 ounce Ensure, or 8 ounces of a Glucerna/Ensure equivalent dietary supplement*. B. Wait 30 minutes. If result is 71 mg/dl to 130 mg/dl: C. Retest patients capillary blood glucose (CBG). D. If result greater than or equal to 110 mg/dl, proceed with HBO. If result less than 110 mg/dl, notify HBO physician and consider holding HBO. If result is 131 mg/dl to 249 mg/dl: A. Proceed with HBO. A. Notify HBO physician and await physician orders. B. It is recommended to hold HBO and do If result is 250 mg/dl or greater: blood/urine ketone testing. C. If the result meets the hospital definition of a critical result, follow hospital policy. POST-HBO GLYCEMIA INTERVENTIONS ACTION INTERVENTION Obtain post HBO capillary blood glucose (ensure 1 physician order is in chart). A. Notify HBO physician and await physician orders. 2 If result is 70 mg/dl or below: B. If the result meets the hospital definition of a critical result, follow hospital policy. A. Give patient an 8 ounce Glucerna Shake, an 8 ounce Ensure, or 8 ounces of a Glucerna/Ensure equivalent dietary supplement*. B. Wait 15 minutes for symptoms of If result is 71 mg/dl to 100 mg/dl: hypoglycemia (i.e. nervousness, anxiety, sweating, chills, clamminess, irritability, confusion, tachycardia or dizziness). C. If patient asymptomatic, discharge patient. If patient symptomatic, repeat capillary blood glucose (CBG) and notify HBO physician. If result is 101 mg/dl to 249 mg/dl: A. Discharge patient. A. Notify HBO physician and await physician orders. B. It is recommended to do blood/urine ketone If result is 250 mg/dl or greater: testing. C. If the result meets the hospital definition of a critical result, follow hospital policy. *Juice or candies are NOT equivalent products. If patient refuses the Glucerna or Ensure, please consult the hospital dietitian for an appropriate substitute. Electronic Signature(s) Signed:  01/28/2021 5:50:27 PM By: Baruch Gouty RN, BSN Signed: 01/28/2021 6:26:15 PM By: Worthy Keeler PA-C Entered By: Baruch Gouty on 01/28/2021 12:09:23 -------------------------------------------------------------------------------- Problem List Details Patient Name: Date of Service: [name], [name] 01/28/2021 10:30 A M Medical Record Number: [id number] Patient Account Number: [id number] Date of Birth/Sex: Treating RN: [birth year] (50 y.o. [name] Primary Care Provider: Minette Brine, [place] Other Clinician: Referring Provider: Treating Provider/Extender: [name], [name] in Treatment: 20 Active Problems ICD-10 Encounter Code Description Active Date MDM Diagnosis [phone] Other chronic osteomyelitis, right ankle and foot 09/05/2020 No Yes E11.621 Type 2 diabetes mellitus with foot ulcer 09/05/2020 No Yes L97.514 Non-pressure chronic ulcer of other part of right foot with necrosis of bone 09/05/2020 No Yes T81.31XS Disruption of external operation (surgical) wound, not elsewhere classified, 09/05/2020 No Yes sequela F17.218 Nicotine dependence, cigarettes, with other nicotine-induced disorders 11/26/2020 No Yes L97.522 Non-pressure chronic ulcer of other part of left foot with fat layer exposed 10/22/2020 No Yes L84 Corns and callosities 01/28/2021 No Yes Inactive Problems Resolved Problems Electronic Signature(s) Signed: 01/28/2021 6:21:07 PM By: Worthy Keeler PA-C Previous Signature: 01/28/2021 10:13:26 AM Version By: Worthy Keeler PA-C Entered By: Worthy Keeler on 01/28/2021 18:21:07 -------------------------------------------------------------------------------- Progress Note Details Patient Name: Date of  Service: [name], [name] 01/28/2021 10:30 A M Medical Record Number: [id number] Patient Account Number: [id number] Date of Birth/Sex: Treating RN: 02-Nov-1970 (50 y.o. [name] Primary Care Provider: [name] Other  Clinician: Referring Provider: Treating Provider/Extender: [name], [name] in Treatment: 20 Subjective Chief Complaint Information obtained from Patient 09/05/2020; patient is here for review of the wound extensively on a right TMA amputation site. He also has a wound on the plantar aspect of the left foot History of Present Illness (HPI) ADMISSION 09/05/2020 This is a 50 year old man who has type 2 diabetes. Essentially the problem began admitted to [facility] health in December with infection gas gangrene. Cultures ultimately grew staph lugdunensis. 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 March Rummage. Shortly thereafter he developed a nonhealing wound with wound dehiscence noted on 05/09/2020. He had a wound VAC I think for a period of time after the surgery but it did not help. He has undergone an IandD and skin graft on the right foot on 05/16/2020. He underwent a further IandD on 06/04/2020 not debrided to bone. 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 Culture of the metatarsal grew Enterobacter cloacae I. He has been followed by Dr. [name] of infectious disease. Currently on Levaquin and doxycycline I think at about the 4-week of 6 mark. At the last visit with Dr. March Rummage he was supposed to have use Santyl although I do not think he has it and he has been using I think a Betadine wet-to-dry. He is referred here by Dr. [name] for our review who saw him yesterday. I cannot see that he has had imaging studies of the right foot. No MRI Past medical history includes type 2 diabetes, in 2021 and ulcer of the left foot that ultimately healed with a wound VAC, stage III chronic kidney disease, first toe amputations bilaterally, hypertension, still a 1/2 pack/day smoker. The patient had ABIs on 08/09/2018 which was 1.00 on the right and 0.96 on the left both areas had triphasic  waveforms. Not felt to have an arterial issue. 09/12/2020; 1 week follow-up. He has been doing the dressings along with home health. This is a deep punched out area in the incision of his transmit site. We use silver alginate last week to help with drainage and antibacterial properties. He tolerated this well 6/24; the patient was actually at his podiatrist this morning who debrided the wound. We have been using silver alginate. They are going to follow him in 6 weeks. 6/29; patient is using silver alginate. Apparently saw Dr. March Rummage this week although I have not checked his note I will try to do so. He is following up in 6 weeks. This was the original transmetatarsal amputation when I first saw this 3 mid part of his amputation site was deep down to bone however this seems to have progressively close down which is gratifying to see. He still has a fairly callus uneven surface however spreading this apart its difficult to identify anything that does not look epithelialized. When I first saw this I thought he would require an extensive debridement perhaps back in the OR by Dr. March Rummage however all of this appears to be looking better and at this point I would continue with the silver alginate see if this holds together over the next several weeks. He does not have an arterial issue 7/13; 2-week follow-up. Unfortunately the area  did not hold together there was drainage on his dressing. Still copious amounts of callus. It was difficult to see where this actually was open therefore I elected to go ahead with a vigorous debridement. Still using silver alginate 7/20 I brought him back after extensive callus debridement last week. The only open area was on the medial part of the foot extensive debridement of thick surrounding callus and I was able to do identify the remaining tunneling area. 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 This is the area  that we may have been following. This is smaller this week but still surrounded with thick callus He came in today with a large area of callus on the left midfoot. This I had noticed previously. HOWEVER our intake nurse clearly incorrectly documented this as opening. The callus and split there was an odor so all of this had to be removed. 10/29/2020 unfortunately upon evaluation today this patient appears to have significant mount of callus noted at this point. There does not appear to be any signs of active infection systemically which is great although locally he is having some discomfort around the lateral portion of his foot on the right. The left foot is no having a lot of callus I am not certain even has an open wound if there is anything is extremely tiny. 11/05/2020 upon evaluation today patient's wounds actually appear to be doing a bit better compared to last week since we have uncovered some of these areas I think there are some definite improvements. With that being said there is still quite a bit of tunneling and undermining at many locations that again has me concerned I still think that the MRI is the best thing to do he has that scheduled for Saturday. 11/26/2020 upon evaluation today patient appears to be doing decently well in regard to his foot in general. He did have an MRI I did review this and he had multiple findings on MRI consistent with osteomyelitis across the board in regard pretty much to his remaining metatarsal regions. Nonetheless there was also some evidence of some involvement of the navicular bone as well. Either way I think that this patient actually is in desperate need of get him in for hyperbaric oxygen therapy we discussed that today that will be detailed in the plan. Nonetheless he is also still on the doxycycline which I think is appropriate for the time being as well I did go ahead and place him on that following the results of the MRI based on what we were  seeing. I gave him that prescription for 14 days for the time being and we will go from there. 12/03/2020 upon evaluation today patient appears to be doing decently well in regard to his wounds in general in regard to the right foot. With that being said he does have a callus on the left foot in the end there was actually an area open area here that will need to be addressed. This is small and I think we take care of it now hopefully will heal quite readily. Fortunately I do not see any signs of infection worsening I am getting need to refill the antibiotic for him today. He has been taking doxycycline which seems to be doing well. We will also get a go ahead and get him approved for HBO therapy. 12/31/2020 upon evaluation today patient appears to be doing really roughly the same as  compared to last time I saw him. Fortunately there is no evidence of active infection which is great news systemically he has started hyperbarics he seems to be doing decently well he said a couple times where anxiety kind of got to him but overall I think he is doing awesome. He is no longer on the antibiotic therapy. With that being said I do believe that he is going to need likely some additional antibiotics based on what I am seeing today we can actually obtain a bone culture from the plantar wound that was hiding underneath the callus. He was having pain in this area which is what may be try to clear away the callus to look sure enough there was an actual opening that went deeper into the wound bed region here. 01/21/2021 upon evaluation today patient appears to be doing about the same in regard to his foot in general. He does have some depth to some of the wound areas I really think he would benefit from is trying to see about getting him set up for a gauze VAC. I think that the gauze VAC could be helpful for him and will work much more effectively with what we are seeing as compared to traditional foam wound VAC. The  patient is in agreement with giving this a trial. Nonetheless unfortunately think that he overall seems to be doing really well and I think we are headed in the right direction as far as the overall healing is concerned. 01/28/2021 upon evaluation today patient appears to be doing well with regard to his wound all things considered he does have a lot of callus buildup especially on the plantar aspect of the left foot. There is no wound here but a significant amount of callus. That is getting need to be addressed today. Subsequently he also has a lot of callus on the right foot as well as some of the area around the wounds actually being significantly callused. Again I need to clear this away today. Objective Constitutional Well-nourished and well-hydrated in no acute distress. Vitals Time Taken: 11:05 AM, Height: 65 in, Source: Stated, Weight: 196 lbs, Source: Stated, BMI: 32.6, Temperature: 97.7 F, Pulse: 88 bpm, Respiratory Rate: 16 breaths/min, Blood Pressure: 119/76 mmHg, Capillary Blood Glucose: 123 mg/dl. General Notes: VS see HBO encounter Respiratory normal breathing without difficulty. Psychiatric this patient is able to make decisions and demonstrates good insight into disease process. Alert and Oriented x 3. pleasant and cooperative. General Notes: Upon inspection patient's wound bed actually showed signs of good granulation and epithelization at this point for the most part. I think he is actually doing quite well and very pleased in that regard. Nonetheless I do believe that he is having a significant callus buildup I did perform a significant debridement of the callus on the left foot as well as the right foot today. He tolerated that without complication. These were all nonwound areas. Also to clean around some of the wound on the plantar foot right side in order to prepare the area for the gauze VAC. Integumentary (Hair, Skin) Wound #3 status is Open. Original cause of wound  was Gradually Appeared. The date acquired was: 10/29/2020. The wound has been in treatment 13 weeks. The wound is located on the Right,Proximal,Lateral Foot. The wound measures 0.5cm length x 0.5cm width x 3cm depth; 0.196cm^2 area and 0.589cm^3 volume. There is Fat Layer (Subcutaneous Tissue) exposed. There is no tunneling noted, however, there is undermining starting at 6:00 and ending at  9:00 with a maximum distance of 1.4cm. There is a medium amount of sanguinous drainage noted. The wound margin is well defined and not attached to the wound base. There is large (67-100%) red granulation within the wound bed. There is no necrotic tissue within the wound bed. Wound #7 status is Open. Original cause of wound was Gradually Appeared. The date acquired was: 12/31/2020. The wound has been in treatment 4 weeks. The wound is located on the [place]. The wound measures 0.5cm length x 0.5cm width x 1.1cm depth; 0.196cm^2 area and 0.216cm^3 volume. There is Fat Layer (Subcutaneous Tissue) exposed. There is undermining starting at 12:00 and ending at 6:00 with a maximum distance of 1.8cm. There is a medium amount of sanguinous drainage noted. The wound margin is well defined and not attached to the wound base. There is large (67-100%) red granulation within the wound bed. There is no necrotic tissue within the wound bed. Wound #8 status is Open. Original cause of wound was Trauma. The date acquired was: 01/16/2021. The wound has been in treatment 1 weeks. The wound is located on the Right,Anterior Lower Leg. The wound measures 0.7cm length x 0.3cm width x 0.1cm depth; 0.165cm^2 area and 0.016cm^3 volume. There is Fat Layer (Subcutaneous Tissue) exposed. There is no tunneling or undermining noted. There is a small amount of serosanguineous drainage noted. The wound margin is distinct with the outline attached to the wound base. There is medium (34-66%) red granulation within the wound bed. There is  a medium (34-66%) amount of necrotic tissue within the wound bed including Adherent Slough. Assessment Active Problems ICD-10 Other chronic osteomyelitis, right ankle and foot Type 2 diabetes mellitus with foot ulcer Non-pressure chronic ulcer of other part of right foot with necrosis of bone Disruption of external operation (surgical) wound, not elsewhere classified, sequela Nicotine dependence, cigarettes, with other nicotine-induced disorders Non-pressure chronic ulcer of other part of left foot with fat layer exposed Corns and callosities Procedures Wound #3 Pre-procedure diagnosis of Wound #3 is a Diabetic Wound/Ulcer of the Lower Extremity located on the Right,Proximal,Lateral Foot .Severity of Tissue Pre Debridement is: Fat layer exposed. There was a Selective/Open Wound Skin/Epidermis Debridement with a total area of 6 sq cm performed by Worthy Keeler, PA. With the following instrument(s): Curette to remove Non-Viable tissue/material. Material removed includes Callus and Skin: Epidermis and after achieving pain control using Other (Benzocaine 20%). No specimens were taken. A time out was conducted at 11:20, prior to the start of the procedure. A Minimum amount of bleeding was controlled with Pressure. The procedure was tolerated well with a pain level of 0 throughout and a pain level of 0 following the procedure. Post Debridement Measurements: 0.5cm length x 0.5cm width x 3cm depth; 0.589cm^3 volume. Character of Wound/Ulcer Post Debridement is improved. Severity of Tissue Post Debridement is: Fat layer exposed. Post procedure Diagnosis Wound #3: Same as Pre-Procedure A Paring/cutting of benign hyperkeratotic lesion 2-4 procedure was performed. by Worthy Keeler, PA. Post procedure Diagnosis Wound #: Same as Pre-Procedure Notes: left plantar foot and right 5th met head using #3 curette Plan Follow-up Appointments: Return Appointment in 1 week. - with Margarita Grizzle after HBO Nurse Visit:  - Fri 11/4 and Mon 11/7 for [facility] change Bathing/ Shower/ Hygiene: May shower with protection but do not get wound dressing(s) wet. Negative Presssure Wound Therapy: Wound Vac to wound continuously at 145m/hg pressure Sterile Gauze Packing Edema Control - Lymphedema / SCD / Other: Elevate legs to the level  of the heart or above for 30 minutes daily and/or when sitting, a frequency of: Avoid standing for long periods of time. Moisturize legs daily. - to foot with dressing changes Off-Loading: Open toe surgical shoe to: - Wear at all times except driving. on right foot Other: - may wear shoe with memory foam insert to left foot, cut hole where callous is and insert in shoe to help offload Additional Orders / Instructions: Stop/Decrease Smoking Follow Nutritious Diet Non Wound Condition: Other Non Wound Condition Orders/Instructions: - cushion bottom of left foot with orthopedic felt or foam Home Health: Admit to [facility] for wound care. May utilize formulary equivalent dressing for wound treatment orders unless otherwise specified. Hyperbaric Oxygen Therapy: Indication: - wagner grade 3 diabetic foot ulcer of right foot If appropriate for treatment, begin HBOT per protocol: 2.5 ATA for 90 Minutes with 2 Five (5) Minute Air Breaks T Number of Treatments: - 40 otal One treatments per day (delivered Monday through Friday unless otherwise specified in Special Instructions below): Finger stick Blood Glucose Pre- and Post- HBOT Treatment. Follow Hyperbaric Oxygen Glycemia Protocol Afrin (Oxymetazoline HCL) 0.05% nasal spray - 1 spray in both nostrils daily as needed prior to HBO treatment for difficulty clearing ears WOUND #3: - Foot Wound Laterality: Right, Lateral, Proximal Cleanser: Soap and Water 1 x Per Week/30 Days Discharge Instructions: May shower and wash wound with dial antibacterial soap and water prior to dressing change. Cleanser: Wound Cleanser 1 x Per Week/30 Days Discharge  Instructions: Cleanse the wound with wound cleanser prior to applying a clean dressing using gauze sponges, not tissue or cotton balls. Peri-Wound Care: Sween Lotion (Moisturizing lotion) 1 x Per Week/30 Days Discharge Instructions: Apply moisturizing lotion to foot with dressing changes Prim Dressing: VAC 1 x Per Week/30 Days ary Secured With: Coban Self-Adherent Wrap 4x5 (in/yd) (Generic) 1 x Per Week/30 Days Discharge Instructions: Secure with Coban as directed. Secured With: [employer], 4.5x3.1 (in/yd) (Generic) 1 x Per Week/30 Days Discharge Instructions: Secure with Kerlix as directed. WOUND #7: - Foot Wound Laterality: Plantar, Right, Lateral Cleanser: Soap and Water 1 x Per Week/30 Days Discharge Instructions: May shower and wash wound with dial antibacterial soap and water prior to dressing change. Cleanser: Wound Cleanser 1 x Per Week/30 Days Discharge Instructions: Cleanse the wound with wound cleanser prior to applying a clean dressing using gauze sponges, not tissue or cotton balls. Peri-Wound Care: Sween Lotion (Moisturizing lotion) 1 x Per Week/30 Days Discharge Instructions: Apply moisturizing lotion to foot with dressing changes Prim Dressing: VAC 1 x Per Week/30 Days ary Secured With: Coban Self-Adherent Wrap 4x5 (in/yd) (Generic) 1 x Per Week/30 Days Discharge Instructions: Secure with Coban as directed. Secured With: [employer], 4.5x3.1 (in/yd) (Generic) 1 x Per Week/30 Days Discharge Instructions: Secure with Kerlix as directed. WOUND #8: - Lower Leg Wound Laterality: Right, Anterior Cleanser: Soap and Water 1 x Per Week/30 Days Discharge Instructions: May shower and wash wound with dial antibacterial soap and water prior to dressing change. Cleanser: Wound Cleanser 1 x Per Week/30 Days Discharge Instructions: Cleanse the wound with wound cleanser prior to applying a clean dressing using gauze sponges, not tissue or cotton balls. Prim Dressing:  Hydrofera Blue Classic Foam, 2x2 in 1 x Per Week/30 Days ary Discharge Instructions: Moisten with saline prior to applying to wound bed Secondary Dressing: Zetuvit Plus Silicone Border Dressing 4x4 (in/in) 1 x Per Week/30 Days Discharge Instructions: Apply silicone border over primary dressing as directed. 1.  Would recommend currently that we actually go ahead and continue with the wound care measures as before and the patient is in agreement the plan specifically with regard to the foot we will get a continue with the gauze VAC at this point I think this is good to be the best way to go. 2. I am also can recommend that we have the patient continue with Hydrofera Blue to the lower leg which I think is doing a good job. 3. I am also can recommend that we have the patient continue with appropriate offloading he is to be trying to limit his walking is much as possible. 4. We will also continue with hyperbaric oxygen therapy. We will see patient back for reevaluation in 1 week here in the clinic. If anything worsens or changes patient will contact our office for additional recommendations. Electronic Signature(s) Signed: 01/28/2021 6:23:21 PM By: Worthy Keeler PA-C Entered By: Worthy Keeler on 01/28/2021 18:23:21 -------------------------------------------------------------------------------- SuperBill Details Patient Name: Date of Service: [name], [name] 01/28/2021 Medical Record Number: [id number] Patient Account Number: [id number] Date of Birth/Sex: Treating RN: 1970/07/06 (50 y.o. [name] Primary Care Provider: [name] Other Clinician: Referring Provider: Treating Provider/Extender: [name], [name] in Treatment: 20 Diagnosis Coding ICD-10 Codes Code Description [phone] Other chronic osteomyelitis, right ankle and foot E11.621 Type 2 diabetes mellitus with foot ulcer L97.514 Non-pressure chronic ulcer of other part of right foot with necrosis  of bone T81.31XS Disruption of external operation (surgical) wound, not elsewhere classified, sequela F17.218 Nicotine dependence, cigarettes, with other nicotine-induced disorders L97.522 Non-pressure chronic ulcer of other part of left foot with fat layer exposed L84 Corns and callosities Facility Procedures CPT4 Code: 38250539 Description: 76734 - DEBRIDE WOUND 1ST 20 SQ CM OR < ICD-10 Diagnosis Description L97.514 Non-pressure chronic ulcer of other part of right foot with necrosis of bone Modifier: Quantity: 1 CPT4 Code: 19379024 Description: 97605 - WOUND VAC-50 SQ CM OR LESS Modifier: 59 Quantity: 1 Physician Procedures : CPT4 Code Description Modifier [id number] 99214 - WC PHYS LEVEL 4 - EST PT 25 ICD-10 Diagnosis Description M86.671 Other chronic osteomyelitis, right ankle and foot E11.621 Type 2 diabetes mellitus with foot ulcer L97.514 Non-pressure chronic ulcer of  other part of right foot with necrosis of bone T81.31XS Disruption of external operation (surgical) wound, not elsewhere classified, sequela Quantity: 1 : [id number] 34196 - WC PHYS DEBR WO ANESTH 20 SQ CM ICD-10 Diagnosis Description L97.514 Non-pressure chronic ulcer of other part of right foot with necrosis of bone Quantity: 1 : 2229798 11056 - WC PHYS TRIM SKIN LESIONS 2 TO 4 ICD-10 Diagnosis Description L84 Corns and callosities Quantity: 1 Electronic Signature(s) Signed: 01/28/2021 6:24:16 PM By: Worthy Keeler PA-C Previous Signature: 01/28/2021 5:50:27 PM Version By: Baruch Gouty RN, BSN Entered By: Worthy Keeler on 01/28/2021 18:24:15

## 2021-01-29 ENCOUNTER — Encounter (HOSPITAL_BASED_OUTPATIENT_CLINIC_OR_DEPARTMENT_OTHER): Payer: 59 | Admitting: Internal Medicine

## 2021-01-29 DIAGNOSIS — E11621 Type 2 diabetes mellitus with foot ulcer: Secondary | ICD-10-CM | POA: Diagnosis not present

## 2021-01-29 LAB — GLUCOSE, CAPILLARY
Glucose-Capillary: 162 mg/dL — ABNORMAL HIGH (ref 70–99)
Glucose-Capillary: 113 mg/dL — ABNORMAL HIGH (ref 70–99)

## 2021-01-29 NOTE — Progress Notes (Signed)
LEGRAND, LASSER ([id number]) Visit Report for 01/29/2021 SuperBill Details Patient Name: Date of Service: [place] [address], [name] 01/29/2021 Medical Record Number: [id number] Patient Account Number: [id number] Date of Birth/Sex: Treating RN: May 24, 1970 (50 y.o. [name], [name] Primary Care Provider: [name] Other Clinician: [name] Referring Provider: Treating Provider/Extender: [name], [name] in Treatment: 20 Diagnosis Coding ICD-10 Codes Code Description [phone] Other chronic osteomyelitis, right ankle and foot E11.621 Type 2 diabetes mellitus with foot ulcer L97.514 Non-pressure chronic ulcer of other part of right foot with necrosis of bone T81.31XS Disruption of external operation (surgical) wound, not elsewhere classified, sequela F17.218 Nicotine dependence, cigarettes, with other nicotine-induced disorders L97.522 Non-pressure chronic ulcer of other part of left foot with fat layer exposed L84 Corns and callosities Facility Procedures CPT4 Code Description Modifier Quantity [id number] G0277-(Facility Use Only) HBOT full body chamber, 78min , 4 ICD-10 Diagnosis Description M86.671 Other chronic osteomyelitis, right ankle and foot E11.621 Type 2 diabetes mellitus with foot ulcer Physician Procedures Quantity CPT4 Code Description Modifier [id number] 30076 - WC PHYS HYPERBARIC OXYGEN THERAPY 1 ICD-10 Diagnosis Description M86.671 Other chronic osteomyelitis, right ankle and foot E11.621 Type 2 diabetes mellitus with foot ulcer Electronic Signature(s) Signed: 01/29/2021 11:37:33 AM By: [name] [name] , , Signed: 01/29/2021 5:02:06 PM By: [name] MD Entered By: [name] on 01/29/2021 11:37:31

## 2021-01-29 NOTE — Progress Notes (Addendum)
[name] ([id number]) Visit Report for 01/29/2021 HBO Details Patient Name: Date of Service: [name], [name] 01/29/2021 8:00 A M Medical Record Number: [id number] Patient Account Number: [id number] Date of Birth/Sex: Treating RN: 12-02-1970 (50 y.o. [name] Primary Care Dajsha Massaro: [name] Other Clinician: [name] Referring Claritza July: Treating Berdia Lachman/Extender: [name], [name] in Treatment: 20 HBO Treatment Course Details Treatment Course Number: 1 Ordering Dahna Hattabaugh: [name] Treatments Ordered: otal 40 HBO Treatment Start Date: 12/08/2020 HBO Indication: Diabetic Ulcer(s) of the Lower Extremity HBO Treatment Details Treatment Number: 25 Patient Type: Outpatient Chamber Type: Monoplace Chamber Serial #: [id number] Treatment Protocol: 2.5 ATA with 90 minutes oxygen, with two 5 minute air breaks Treatment Details Compression Rate Down: 2.0 psi / minute De-Compression Rate Up: 2.0 psi / minute A breaks and breathing ir Compress Tx Pressure periods Decompress Decompress Begins Reached (leave unused spaces Begins Ends blank) Chamber Pressure (ATA 1 2.5 2.5 2.5 2.5 2.5 - - 2.5 1 ) Clock Time (24 hr) 08:19 08:30 09:00 09:05 09:35 09:40 - - 10:10 10:21 Treatment Length: 122 (minutes) Treatment Segments: 4 Vital Signs Capillary Blood Glucose Reference Range: 80 - 120 mg / dl HBO Diabetic Blood Glucose Intervention Range: <131 mg/dl or >249 mg/dl Type: Time Vitals Blood Pulse: Respiratory Temperature: Capillary Blood Glucose Pulse Action Taken: Pressure: Rate: Glucose (mg/dl): Meter #: Oximetry (%) Taken: Pre 08:15 150/90 91 18 97.7 162 Post 10:27 154/95 89 18 97.5 113 discharge per protocol >101 mg/dL Treatment Response Treatment Toleration: Well Treatment Completion Status: Treatment Completed without Adverse Event Additional Procedure Documentation Tissue Sevierity: Fat layer exposed Pancho Rushing Notes No concerns  with treatment given Physician HBO Attestation: I certify that I supervised this HBO treatment in accordance with Medicare guidelines. A trained emergency response team is readily available per Yes hospital policies and procedures. Continue HBOT as ordered. Yes Electronic Signature(s) Signed: 01/29/2021 5:02:06 PM By: [name] MD Previous Signature: 01/29/2021 11:37:09 AM Version By: [name] [name] , , Entered By: [name] on 01/29/2021 17:00:43 -------------------------------------------------------------------------------- HBO Safety Checklist Details Patient Name: Date of Service: [name] [name] 01/29/2021 8:00 A M Medical Record Number: [id number] Patient Account Number: [id number] Date of Birth/Sex: Treating RN: [birth year] (50 y.o. [name] Primary Care Dashton Czerwinski: [name] Other Clinician: [name] Referring Nicholaos Schippers: Treating Jodi Kappes/Extender: [name], [name] in Treatment: 20 HBO Safety Checklist Items Safety Checklist Consent Form Signed Patient voided / foley secured and emptied When did you last eato Breakfast Last dose of injectable or oral agent 0630 Ostomy pouch emptied and vented if applicable NA All implantable devices assessed, documented and approved NA Intravenous access site secured and place NA Valuables secured Linens and cotton and cotton/polyester blend (less than 51% polyester) Personal oil-based products / skin lotions / body lotions removed Wigs or hairpieces removed NA Smoking or tobacco materials removed NA Books / newspapers / magazines / loose paper removed Cologne, aftershave, perfume and deodorant removed Jewelry removed (may wrap wedding band) Make-up removed NA Hair care products removed Battery operated devices (external) removed Heating patches and chemical warmers removed Titanium eyewear removed NA Nail polish cured greater than 10 hours NA Casting  material cured greater than 10 hours NA Hearing aids removed NA Loose dentures or partials removed NA Prosthetics have been removed NA Patient demonstrates correct use of air break device (if applicable) Patient concerns have been addressed Patient grounding bracelet on and cord attached to chamber Specifics  for Inpatients (complete in addition to above) Medication sheet sent with patient NA Intravenous medications needed or due during therapy sent with patient NA Drainage tubes (e.g. nasogastric tube or chest tube secured and vented) NA Endotracheal or Tracheotomy tube secured NA Cuff deflated of air and inflated with saline NA Airway suctioned NA Notes Paper version used prior to treatment. Electronic Signature(s) Signed: 01/29/2021 1:50:52 PM By: [name] [name] , , Previous Signature: 01/29/2021 11:31:54 AM Version By: [name] [name] , , Entered By: [name] on 01/29/2021 13:50:51

## 2021-01-29 NOTE — Progress Notes (Addendum)
[name], [name] ([id number]) Visit Report for 01/29/2021 Arrival Information Details Patient Name: Date of Service: [address] [address], [name] 01/29/2021 8:00 A M Medical Record Number: [id number] Patient Account Number: [id number] Date of Birth/Sex: Treating RN: May 17, 1970 (50 y.o. [name], Meta.Reding Primary Care Glenette Bookwalter: [name] Other Clinician: [name] Referring Bresha Hosack: Treating Zeniya Lapidus/Extender: [name], [name] in Treatment: 20 Visit Information History Since Last Visit All ordered tests and consults were completed: Yes Patient Arrived: [name] Added or deleted any medications: No Arrival Time: 07:35 Any new allergies or adverse reactions: No Accompanied By: self Had a fall or experienced change in No Transfer Assistance: None activities of daily living that may affect Patient Identification Verified: Yes risk of falls: Secondary Verification Process Completed: Yes Signs or symptoms of abuse/neglect since last visito No Patient Requires Transmission-Based Precautions: No Hospitalized since last visit: No Patient Has Alerts: No Implantable device outside of the clinic excluding No cellular tissue based products placed in the center since last visit: Pain Present Now: No Electronic Signature(s) Signed: 01/29/2021 11:27:44 AM By: [name] [name] , , Entered By: [name] on 01/29/2021 11:27:43 -------------------------------------------------------------------------------- Encounter Discharge Information Details Patient Name: Date of Service: [name], [place]. 01/29/2021 8:00 A M Medical Record Number: [id number] Patient Account Number: [id number] Date of Birth/Sex: Treating RN: 1970/09/29 (50 y.o. [name] Primary Care Halen Mossbarger: [name] Other Clinician: [name] Referring Glendon Fiser: Treating Isamu Trammel/Extender: [name], [name] in Treatment: 20 Encounter Discharge Information  Items Discharge Condition: Stable Ambulatory Status: Cane Discharge Destination: Home Transportation: Private Auto Accompanied By: self Schedule Follow-up Appointment: No Clinical Summary of Care: Electronic Signature(s) Signed: 01/29/2021 11:38:07 AM By: [name] [name] , , Entered By: [name] on 01/29/2021 11:38:07 -------------------------------------------------------------------------------- [place] Details Patient Name: Date of Service: [name], [place]. 01/29/2021 8:00 A M Medical Record Number: [id number] Patient Account Number: [id number] Date of Birth/Sex: Treating RN: 01-03-1971 (50 y.o. [name], [name] Primary Care Brinleigh Tew: [name] Other Clinician: [name] Referring Dequita Schleicher: Treating Grey Schlauch/Extender: [name], [name] in Treatment: 20 Vital Signs Time Taken: 08:15 Temperature (F): 97.7 Height (in): 65 Pulse (bpm): 91 Weight (lbs): 196 Respiratory Rate (breaths/min): 18 Body Mass Index (BMI): 32.6 Blood Pressure (mmHg): 150/90 Capillary Blood Glucose (mg/dl): 162 Reference Range: 80 - 120 mg / dl Electronic Signature(s) Signed: 01/29/2021 11:30:44 AM By: [name] [name] , , Entered By: [name] on 01/29/2021 11:30:43

## 2021-01-30 ENCOUNTER — Encounter (HOSPITAL_BASED_OUTPATIENT_CLINIC_OR_DEPARTMENT_OTHER): Payer: 59 | Admitting: Internal Medicine

## 2021-01-30 ENCOUNTER — Other Ambulatory Visit: Payer: Self-pay

## 2021-01-30 DIAGNOSIS — M86671 Other chronic osteomyelitis, right ankle and foot: Secondary | ICD-10-CM

## 2021-01-30 DIAGNOSIS — E11621 Type 2 diabetes mellitus with foot ulcer: Secondary | ICD-10-CM | POA: Diagnosis not present

## 2021-01-30 LAB — GLUCOSE, CAPILLARY
Glucose-Capillary: 170 mg/dL — ABNORMAL HIGH (ref 70–99)
Glucose-Capillary: 114 mg/dL — ABNORMAL HIGH (ref 70–99)

## 2021-01-30 NOTE — Progress Notes (Signed)
[name], [name] ([id number]) Visit Report for 01/30/2021 SuperBill Details Patient Name: Date of Service: [address], [name] 01/30/2021 Medical Record Number: [id number] Patient Account Number: [id number] Date of Birth/Sex: Treating RN: 03-21-1971 (50 y.o. [name] Primary Care Provider: [name] Other Clinician: Referring Provider: Treating Provider/Extender: [name], [name] in Treatment: 21 Diagnosis Coding ICD-10 Codes Code Description [phone] Other chronic osteomyelitis, right ankle and foot E11.621 Type 2 diabetes mellitus with foot ulcer L97.514 Non-pressure chronic ulcer of other part of right foot with necrosis of bone T81.31XS Disruption of external operation (surgical) wound, not elsewhere classified, sequela F17.218 Nicotine dependence, cigarettes, with other nicotine-induced disorders L97.522 Non-pressure chronic ulcer of other part of left foot with fat layer exposed L84 Corns and callosities Facility Procedures CPT4 Code Description Modifier Quantity [id number] 97605 - WOUND VAC-50 SQ CM OR LESS 1 Electronic Signature(s) Signed: 01/30/2021 1:57:52 PM By: Kalman Shan DO Signed: 01/30/2021 2:36:44 PM By: [name] RN, BSN Entered By: [name] on 01/30/2021 13:55:05

## 2021-01-30 NOTE — Progress Notes (Addendum)
TANYA, MARVIN ([id number]) Visit Report for 01/30/2021 HBO Details Patient Name: Date of Service: [place] [address], [name] 01/30/2021 8:00 A M Medical Record Number: [id number] Patient Account Number: [id number] Date of Birth/Sex: Treating RN: [birth year] (50 y.o. [name] Primary Care Zachary Nole: [name] Other Clinician: Referring Kamdin Follett: Treating Deshae Dickison/Extender: [name], [name] in Treatment: 21 HBO Treatment Course Details Treatment Course Number: 1 Ordering Navjot Loera: [name] Treatments Ordered: otal 40 HBO Treatment Start Date: 12/08/2020 HBO Indication: Diabetic Ulcer(s) of the Lower Extremity HBO Treatment Details Treatment Number: 26 Patient Type: Outpatient Chamber Type: Monoplace Chamber Serial #: [id number] Treatment Protocol: 2.5 ATA with 90 minutes oxygen, with two 5 minute air breaks Treatment Details Compression Rate Down: 2.0 psi / minute De-Compression Rate Up: 2.0 psi / minute A breaks and breathing ir Compress Tx Pressure periods Decompress Decompress Begins Reached (leave unused spaces Begins Ends blank) Chamber Pressure (ATA 1 2.5 2.5 2.5 2.5 2.5 - - 2.5 1 ) Clock Time (24 hr) 08:17 08:27 08:57 09:02 09:32 09:37 - - 10:07 10:16 Treatment Length: 119 (minutes) Treatment Segments: 4 Vital Signs Capillary Blood Glucose Reference Range: 80 - 120 mg / dl HBO Diabetic Blood Glucose Intervention Range: <131 mg/dl or >249 mg/dl Time Vitals Blood Respiratory Capillary Blood Glucose Pulse Action Type: Pulse: Temperature: Taken: Pressure: Rate: Glucose (mg/dl): Meter #: Oximetry (%) Taken: Pre 08:02 139/87 92 16 97.9 170 Post 10:20 131/89 82 18 97.8 117 Treatment Response Treatment Completion Status: Treatment Completed without Adverse Event Treatment Notes Patient treatment completed and uneventful. Patient stayed awake watching TV throughout therapy time. Additional Procedure Documentation Tissue Sevierity:  Muscle involvement without necrosis Physician HBO Attestation: I certify that I supervised this HBO treatment in accordance with Medicare guidelines. A trained emergency response team is readily available per Yes hospital policies and procedures. Continue HBOT as ordered. Yes Electronic Signature(s) Signed: 01/30/2021 1:16:39 PM By: Kalman Shan DO Previous Signature: 01/30/2021 10:55:32 AM Version By: Maye Hides Previous Signature: 01/30/2021 10:42:18 AM Version By: Maye Hides Entered By: Kalman Shan on 01/30/2021 13:15:27 -------------------------------------------------------------------------------- HBO Safety Checklist Details Patient Name: Date of Service: [name], [place]. 01/30/2021 8:00 A M Medical Record Number: [id number] Patient Account Number: [id number] Date of Birth/Sex: Treating RN: 1970/07/02 (50 y.o. [name], [name] Primary Care Shatasia Cutshaw: [name] Other Clinician: Referring Shalane Florendo: Treating Michaeljoseph Revolorio/Extender: [name], [name] in Treatment: 21 HBO Safety Checklist Items Safety Checklist Consent Form Signed Patient voided / foley secured and emptied When did you last eato 0630 Last dose of injectable or oral agent Ostomy pouch emptied and vented if applicable NA All implantable devices assessed, documented and approved NA Intravenous access site secured and place NA Valuables secured Linens and cotton and cotton/polyester blend (less than 51% polyester) Personal oil-based products / skin lotions / body lotions removed Wigs or hairpieces removed NA Smoking or tobacco materials removed NA Books / newspapers / magazines / loose paper removed Cologne, aftershave, perfume and deodorant removed Jewelry removed (may wrap wedding band) Make-up removed NA Hair care products removed NA Battery operated devices (external) removed NA Heating patches and chemical warmers removed Titanium eyewear removed NA Nail  polish cured greater than 10 hours NA Casting material cured greater than 10 hours NA Hearing aids removed NA Loose dentures or partials removed NA Prosthetics have been removed NA Patient demonstrates correct use of air break device (if applicable) Patient concerns have been addressed Patient grounding bracelet on and cord attached to  chamber Specifics for Inpatients (complete in addition to above) Medication sheet sent with patient Intravenous medications needed or due during therapy sent with patient Drainage tubes (e.g. nasogastric tube or chest tube secured and vented) Endotracheal or Tracheotomy tube secured Cuff deflated of air and inflated with saline Airway suctioned Electronic Signature(s) Signed: 01/30/2021 10:39:42 AM By: Maye Hides Entered By: Maye Hides on 01/30/2021 10:39:42

## 2021-01-30 NOTE — Progress Notes (Signed)
RALIEGH, SCOBIE ([id number]) Visit Report for 01/30/2021 SuperBill Details Patient Name: Date of Service: [place] [address], [name] 01/30/2021 Medical Record Number: [id number] Patient Account Number: [id number] Date of Birth/Sex: Treating RN: [birth year] (50 y.o. [name], [name] Primary Care Provider: [name] Other Clinician: Referring Provider: Treating Provider/Extender: [name], [name] in Treatment: 21 Diagnosis Coding ICD-10 Codes Code Description [phone] Other chronic osteomyelitis, right ankle and foot E11.621 Type 2 diabetes mellitus with foot ulcer L97.514 Non-pressure chronic ulcer of other part of right foot with necrosis of bone T81.31XS Disruption of external operation (surgical) wound, not elsewhere classified, sequela F17.218 Nicotine dependence, cigarettes, with other nicotine-induced disorders L97.522 Non-pressure chronic ulcer of other part of left foot with fat layer exposed L84 Corns and callosities Facility Procedures CPT4 Code Description Modifier Quantity [id number] G0277-(Facility Use Only) HBOT full body chamber, 41min , 4 ICD-10 Diagnosis Description M86.671 Other chronic osteomyelitis, right ankle and foot E11.621 Type 2 diabetes mellitus with foot ulcer Physician Procedures Quantity CPT4 Code Description Modifier [id number] 27741 - WC PHYS HYPERBARIC OXYGEN THERAPY 1 ICD-10 Diagnosis Description M86.671 Other chronic osteomyelitis, right ankle and foot Electronic Signature(s) Signed: 01/30/2021 10:43:27 AM By: Maye Hides Signed: 01/30/2021 1:16:39 PM By: Kalman Shan DO Entered By: Maye Hides on 01/30/2021 10:43:25

## 2021-01-31 NOTE — Telephone Encounter (Signed)
Please confirm if there are additional questions in regards to patient medications per my response 4 days ago.

## 2021-02-01 NOTE — Progress Notes (Signed)
Erroneous encounter

## 2021-02-02 ENCOUNTER — Other Ambulatory Visit: Payer: Self-pay

## 2021-02-02 ENCOUNTER — Encounter (HOSPITAL_BASED_OUTPATIENT_CLINIC_OR_DEPARTMENT_OTHER): Payer: 59 | Admitting: Internal Medicine

## 2021-02-02 DIAGNOSIS — M86671 Other chronic osteomyelitis, right ankle and foot: Secondary | ICD-10-CM

## 2021-02-02 DIAGNOSIS — E11621 Type 2 diabetes mellitus with foot ulcer: Secondary | ICD-10-CM | POA: Diagnosis not present

## 2021-02-02 LAB — GLUCOSE, CAPILLARY
Glucose-Capillary: 250 mg/dL — ABNORMAL HIGH (ref 70–99)
Glucose-Capillary: 115 mg/dL — ABNORMAL HIGH (ref 70–99)

## 2021-02-02 NOTE — Progress Notes (Signed)
KHOI, HAMBERGER ([id number]) Visit Report for 02/02/2021 SuperBill Details Patient Name: Date of Service: [place] [address], [name] 02/02/2021 Medical Record Number: [id number] Patient Account Number: [id number] Date of Birth/Sex: Treating RN: 1971/02/27 (50 y.o. [name] Primary Care Provider: [name] Other Clinician: Referring Provider: Treating Provider/Extender: [name], [name] in Treatment: 21 Diagnosis Coding ICD-10 Codes Code Description [phone] Other chronic osteomyelitis, right ankle and foot E11.621 Type 2 diabetes mellitus with foot ulcer L97.514 Non-pressure chronic ulcer of other part of right foot with necrosis of bone T81.31XS Disruption of external operation (surgical) wound, not elsewhere classified, sequela F17.218 Nicotine dependence, cigarettes, with other nicotine-induced disorders L97.522 Non-pressure chronic ulcer of other part of left foot with fat layer exposed L84 Corns and callosities Facility Procedures CPT4 Code Description Modifier Quantity [id number] 97605 - WOUND VAC-50 SQ CM OR LESS 1 Electronic Signature(s) Signed: 02/02/2021 3:06:40 PM By: Kalman Shan DO Signed: 02/02/2021 5:27:57 PM By: [name] RN, BSN Entered By: [name] on 02/02/2021 12:08:08

## 2021-02-02 NOTE — Progress Notes (Signed)
OSHAE, SIMMERING ([id number]) Visit Report for 01/30/2021 Arrival Information Details Patient Name: Date of Service: [place] [address], [name] 01/30/2021 8:00 A M Medical Record Number: [id number] Patient Account Number: [id number] Date of Birth/Sex: Treating RN: 1970/12/23 (50 y.o. [name], [name] Primary Care Nylene Inlow: [name] Other Clinician: Referring Felissa Blouch: Treating Rainelle Sulewski/Extender: [name], [name] in Treatment: 21 Visit Information History Since Last Visit All ordered tests and consults were completed: Yes Patient Arrived: Cane Added or deleted any medications: No Arrival Time: 08:00 Any new allergies or adverse reactions: No Accompanied By: self Had a fall or experienced change in No Transfer Assistance: None activities of daily living that may affect Patient Identification Verified: Yes risk of falls: Secondary Verification Process Completed: Yes Signs or symptoms of abuse/neglect since last visito No Patient Requires Transmission-Based Precautions: No Hospitalized since last visit: No Patient Has Alerts: No Implantable device outside of the clinic excluding No cellular tissue based products placed in the center since last visit: Has Dressing in Place as Prescribed: Yes Pain Present Now: No Electronic Signature(s) Signed: 01/30/2021 10:37:32 AM By: Maye Hides Entered By: Maye Hides on 01/30/2021 10:37:32 -------------------------------------------------------------------------------- Vitals Details Patient Name: Date of Service: [name], [place] 01/30/2021 8:00 A M Medical Record Number: [id number] Patient Account Number: [id number] Date of Birth/Sex: Treating RN: 12-02-1970 (50 y.o. [name] Primary Care Osborne Serio: [name] Other Clinician: Referring Coriana Angello: Treating Shelden Raborn/Extender: [name], [name] in Treatment: 21 Vital Signs Time Taken: 08:02 Temperature (F): 97.9 Height (in):  65 Pulse (bpm): 92 Weight (lbs): 196 Respiratory Rate (breaths/min): 16 Body Mass Index (BMI): 32.6 Blood Pressure (mmHg): 139/87 Capillary Blood Glucose (mg/dl): 170 Reference Range: 80 - 120 mg / dl Electronic Signature(s) Signed: 01/30/2021 10:38:16 AM By: Maye Hides Entered By: Maye Hides on 01/30/2021 10:38:15

## 2021-02-02 NOTE — Progress Notes (Signed)
[name], [name] ([id number]) Visit Report for 01/30/2021 Arrival Information Details Patient Name: Date of Service: [name], [name] 01/30/2021 11:15 A M Medical Record Number: [id number] Patient Account Number: [id number] Date of Birth/Sex: Treating RN: 12-19-1970 (50 y.o. [name], Meta.Reding Primary Care Deysha Cartier: [name] Other Clinician: Referring Curran Lenderman: Treating Maigan Bittinger/Extender: [name], [name] in Treatment: 21 Visit Information History Since Last Visit Added or deleted any medications: No Patient Arrived: [name] Any new allergies or adverse reactions: No Arrival Time: 10:37 Had a fall or experienced change in No Accompanied By: self activities of daily living that may affect Transfer Assistance: None risk of falls: Patient Identification Verified: Yes Signs or symptoms of abuse/neglect since last visito No Secondary Verification Process Completed: Yes Hospitalized since last visit: No Patient Requires Transmission-Based Precautions: No Implantable device outside of the clinic excluding No Patient Has Alerts: No cellular tissue based products placed in the center since last visit: Has Dressing in Place as Prescribed: Yes Pain Present Now: No Electronic Signature(s) Signed: 02/02/2021 3:53:30 PM By: Sandre Kitty Entered By: Sandre Kitty on 01/30/2021 10:38:00 -------------------------------------------------------------------------------- Encounter Discharge Information Details Patient Name: Date of Service: Joseph Hahn RD, [name] 01/30/2021 11:15 A M Medical Record Number: [id number] Patient Account Number: [id number] Date of Birth/Sex: Treating RN: 12/02/1970 (50 y.o. [name] Primary Care Axiel Fjeld: [name] Other Clinician: Referring Danelly Hassinger: Treating Charese Abundis/Extender: [name], [name] in Treatment: 21 Encounter Discharge Information Items Discharge Condition: Stable Ambulatory Status:  Cane Discharge Destination: Home Transportation: Private Auto Accompanied By: self Schedule Follow-up Appointment: Yes Clinical Summary of Care: Electronic Signature(s) Signed: 01/30/2021 2:36:44 PM By: [name] RN, BSN Entered By: [name] on 01/30/2021 13:54:49 -------------------------------------------------------------------------------- Negative Pressure Wound Therapy Maintenance (NPWT) Details Patient Name: Date of Service: [name], [name] 01/30/2021 11:15 A M Medical Record Number: [id number] Patient Account Number: [id number] Date of Birth/Sex: Treating RN: 1971/02/21 (50 y.o. [name] Primary Care Harrietta Incorvaia: [name] Other Clinician: Referring Delmar Dondero: Treating Zamaya Rapaport/Extender: [name], [name] in Treatment: 21 NPWT Maintenance Performed for: Wound #7 Right, Lateral, Plantar Foot Additional Injuries Covered: Yes Additional Injuries: Wound #3 Right, Proximal, Lateral Foot Performed By: [name], RN Type: VAC System Coverage Size (sq cm): 0.5 Pressure Type: Constant Pressure Setting: 125 mmHG Drain Type: None Primary Contact: Non-Adherent Sponge/Dressing Type: Gauze Date Initiated: 01/28/2021 Dressing Removed: No Quantity of Sponges/Gauze Removed: x1 gauze and x1 black foam removed Canister Changed: No Dressing Reapplied: Yes Quantity of Sponges/Gauze Inserted: see note Respones T Treatment: o tolerated well Days On NPWT : 3 Notes bridge gauze between foot wounds then bridge black foam over the gauze to the anterior portion of lower leg. Electronic Signature(s) Signed: 01/30/2021 2:36:44 PM By: [name] RN, BSN Entered By: [name] on 01/30/2021 13:54:13 -------------------------------------------------------------------------------- Patient/Caregiver Education Details Patient Name: Date of Service: Joseph Hahn RD, [name] 11/4/2022andnbsp11:15 A M Medical Record Number: [id number] Patient Account  Number: [id number] Date of Birth/Gender: Treating RN: 12/14/1970 (50 y.o. [name] Primary Care Physician: [name] Other Clinician: Referring Physician: Treating Physician/Extender: [name] Weeks in Treatment: 21 Education Assessment Education Provided To: Patient Education Topics Provided Wound/Skin Impairment: Handouts: Skin Care Do's and Dont's Methods: Explain/Verbal Responses: Reinforcements needed Electronic Signature(s) Signed: 01/30/2021 2:36:44 PM By: [name] RN, BSN Entered By: [name] on 01/30/2021 13:54:39 -------------------------------------------------------------------------------- Wound Assessment Details Patient Name: Date of Service: [name], [place] [name] 01/30/2021 11:15 A M Medical Record Number: [id number]  Patient Account Number: [id number] Date of Birth/Sex: Treating RN: [birth year] (50 y.o. [name] Primary Care Indiya Izquierdo: [name] Other Clinician: Referring Franklyn Cafaro: Treating Bodin Gorka/Extender: [name], [name] in Treatment: 21 Wound Status Wound Number: 3 Primary Etiology: Diabetic Wound/Ulcer of the Lower Extremity Wound Location: Right, Proximal, Lateral Foot Wound Status: Open Wounding Event: Gradually Appeared Date Acquired: 10/29/2020 Weeks Of Treatment: 13 Clustered Wound: No Wound Measurements Length: (cm) 0.5 Width: (cm) 0.5 Depth: (cm) 3 Area: (cm) 0.196 Volume: (cm) 0.589 % Reduction in Area: -55.6% % Reduction in Volume: -569.3% Wound Description Classification: Grade 3 Exudate Amount: Medium Exudate Type: Sanguinous Exudate Color: red Electronic Signature(s) Signed: 01/30/2021 2:36:44 PM By: [name] RN, BSN Signed: 02/02/2021 3:53:30 PM By: Sandre Kitty Entered By: Sandre Kitty on 01/30/2021 10:38:44 -------------------------------------------------------------------------------- Wound Assessment Details Patient Name: Date of  Service: Joseph Hahn RD, [name] 01/30/2021 11:15 A M Medical Record Number: [id number] Patient Account Number: [id number] Date of Birth/Sex: Treating RN: 16-Jun-1970 (50 y.o. [name] Primary Care Rani Sisney: [name] Other Clinician: Referring Deneen Slager: Treating Rilen Shukla/Extender: [name], [name] in Treatment: 21 Wound Status Wound Number: 7 Primary Etiology: Diabetic Wound/Ulcer of the Lower Extremity Wound Location: Right, Lateral, Plantar Foot Wound Status: Open Wounding Event: Gradually Appeared Date Acquired: 12/31/2020 Weeks Of Treatment: 4 Clustered Wound: No Wound Measurements Length: (cm) 0.5 Width: (cm) 0.5 Depth: (cm) 1.1 Area: (cm) 0.196 Volume: (cm) 0.216 % Reduction in Area: 0% % Reduction in Volume: -10.2% Wound Description Classification: Grade 3 Exudate Amount: Medium Exudate Type: Sanguinous Exudate Color: red Electronic Signature(s) Signed: 01/30/2021 2:36:44 PM By: [name] RN, BSN Signed: 02/02/2021 3:53:30 PM By: Sandre Kitty Entered By: Sandre Kitty on 01/30/2021 10:38:44 -------------------------------------------------------------------------------- Wound Assessment Details Patient Name: Date of Service: Joseph Hahn RD, [name] 01/30/2021 11:15 A M Medical Record Number: [id number] Patient Account Number: [id number] Date of Birth/Sex: Treating RN: [birth year] (50 y.o. [name] Primary Care Tashianna Broome: [name] Other Clinician: Referring Morningstar Toft: Treating Davielle Lingelbach/Extender: [name], [name] in Treatment: 21 Wound Status Wound Number: 8 Primary Etiology: Trauma, Other Wound Location: Right, Anterior Lower Leg Wound Status: Open Wounding Event: Trauma Date Acquired: 01/16/2021 Weeks Of Treatment: 1 Clustered Wound: No Wound Measurements Length: (cm) 0.7 Width: (cm) 0.3 Depth: (cm) 0.1 Area: (cm) 0.165 Volume: (cm) 0.016 % Reduction in Area: 80% % Reduction in  Volume: 90.3% Wound Description Classification: Full Thickness Without Exposed Support Structu Exudate Amount: Small Exudate Type: Serosanguineous Exudate Color: red, brown res Electronic Signature(s) Signed: 01/30/2021 2:36:44 PM By: [name] RN, BSN Signed: 02/02/2021 3:53:30 PM By: Sandre Kitty Entered By: Sandre Kitty on 01/30/2021 10:38:44 -------------------------------------------------------------------------------- Vitals Details Patient Name: Date of Service: Joseph Hahn RD, [name] 01/30/2021 11:15 A M Medical Record Number: [id number] Patient Account Number: [id number] Date of Birth/Sex: Treating RN: May 20, 1970 (50 y.o. [name] Primary Care Charli Halle: [name] Other Clinician: Referring Cira Deyoe: Treating Jaquese Irving/Extender: [name], [name] in Treatment: 21 Vital Signs Time Taken: 10:38 Temperature (F): 97.7 Height (in): 65 Pulse (bpm): 91 Weight (lbs): 196 Respiratory Rate (breaths/min): 18 Body Mass Index (BMI): 32.6 Blood Pressure (mmHg): 150/90 Capillary Blood Glucose (mg/dl): 162 Reference Range: 80 - 120 mg / dl Electronic Signature(s) Signed: 02/02/2021 3:53:30 PM By: Sandre Kitty Entered By: Sandre Kitty on 01/30/2021 10:38:21

## 2021-02-02 NOTE — Progress Notes (Signed)
[name], [name] ([id number]) Visit Report for 02/02/2021 Arrival Information Details Patient Name: Date of Service: [name], [name] 02/02/2021 8:00 A M Medical Record Number: [id number] Patient Account Number: [id number] Date of Birth/Sex: Treating RN: 03/16/1971 (50 y.o. [name] Primary Care [name]: [name] Other Clinician: Referring [name]: Treating [name]/Extender: [name], [name] in Treatment: 21 Visit Information History Since Last Visit Added or deleted any medications: No Patient Arrived: [name] Any new allergies or adverse reactions: No Arrival Time: 07:58 Had a fall or experienced change in No Accompanied By: alone activities of daily living that may affect Transfer Assistance: None risk of falls: Patient Identification Verified: Yes Signs or symptoms of abuse/neglect since last visito No Secondary Verification Process Completed: Yes Hospitalized since last visit: No Patient Requires Transmission-Based Precautions: No Implantable device outside of the clinic excluding No Patient Has Alerts: No cellular tissue based products placed in the center since last visit: Has Dressing in Place as Prescribed: Yes Pain Present Now: No Electronic Signature(s) Signed: 02/02/2021 12:54:29 PM By: Joseph Hurst RN, BSN Entered By: [name] on 02/02/2021 08:30:21 -------------------------------------------------------------------------------- [place] Details Patient Name: Date of Service: [name] RD, [name] 02/02/2021 8:00 A M Medical Record Number: [id number] Patient Account Number: [id number] Date of Birth/Sex: Treating RN: [birth year] (50 y.o. [name] Primary Care [name]: [name] Other Clinician: Referring [name]: Treating [name]/Extender: [name], [name] in Treatment: 21 Vital Signs Time Taken: 07:58 Temperature (F): 98.1 Height (in): 65 Pulse (bpm): 89 Weight (lbs):  196 Respiratory Rate (breaths/min): 16 Body Mass Index (BMI): 32.6 Blood Pressure (mmHg): 134/73 Capillary Blood Glucose (mg/dl): 250 Reference Range: 80 - 120 mg / dl Electronic Signature(s) Signed: 02/02/2021 12:54:29 PM By: Joseph Hurst RN, BSN Entered By: [name] on 02/02/2021 08:31:07

## 2021-02-02 NOTE — Progress Notes (Signed)
KA, FLAMMER ([id number]) Visit Report for 02/02/2021 [place] Details Patient Name: Date of Service: [place] RD, [name] 02/02/2021 Medical Record Number: [id number] Patient Account Number: [id number] Date of Birth/Sex: Treating RN: 02/07/1971 (50 y.o. [name] Primary Care Provider: [name] Other Clinician: Referring Provider: Treating Provider/Extender: [name], [name] in Treatment: 21 Diagnosis Coding ICD-10 Codes Code Description [phone] Other chronic osteomyelitis, right ankle and foot E11.621 Type 2 diabetes mellitus with foot ulcer L97.514 Non-pressure chronic ulcer of other part of right foot with necrosis of bone T81.31XS Disruption of external operation (surgical) wound, not elsewhere classified, sequela F17.218 Nicotine dependence, cigarettes, with other nicotine-induced disorders L97.522 Non-pressure chronic ulcer of other part of left foot with fat layer exposed L84 Corns and callosities Facility Procedures CPT4 Code Description Modifier Quantity [id number]-(Facility Use Only) HBOT full body chamber, 60min , 4 Physician Procedures Quantity CPT4 Code Description Modifier [id number] - WC PHYS HYPERBARIC OXYGEN THERAPY 1 ICD-10 Diagnosis Description E11.621 Type 2 diabetes mellitus with foot ulcer M86.671 Other chronic osteomyelitis, right ankle and foot Electronic Signature(s) Signed: 02/02/2021 12:54:29 PM By: Levan Hurst RN, BSN Signed: 02/02/2021 3:06:40 PM By: Kalman Shan DO Entered By: Levan Hurst on 02/02/2021 12:41:12

## 2021-02-02 NOTE — Progress Notes (Signed)
[name], [name] ([id number]) Visit Report for 02/02/2021 Arrival Information Details Patient Name: Date of Service: [place] [name], [name] 02/02/2021 11:00 A M Medical Record Number: [id number] Patient Account Number: [id number] Date of Birth/Sex: Treating RN: 09-28-1970 (50 y.o. [name] Primary Care Taleigha Pinson: [name] Other Clinician: Referring Davante Gerke: Treating Kaion Tisdale/Extender: [name], [name] in Treatment: 21 Visit Information History Since Last Visit Added or deleted any medications: No Patient Arrived: Ambulatory Any new allergies or adverse reactions: No Arrival Time: 10:58 Had a fall or experienced change in No Accompanied By: self activities of daily living that may affect Transfer Assistance: None risk of falls: Patient Requires Transmission-Based Precautions: No Signs or symptoms of abuse/neglect since last visito No Patient Has Alerts: No Hospitalized since last visit: No Implantable device outside of the clinic excluding No cellular tissue based products placed in the center since last visit: Pain Present Now: No Electronic Signature(s) Signed: 02/02/2021 4:55:55 PM By: Dellie Catholic RN Entered By: Dellie Catholic on 02/02/2021 10:59:03 -------------------------------------------------------------------------------- Encounter Discharge Information Details Patient Name: Date of Service: [name], [place] 02/02/2021 11:00 A M Medical Record Number: [id number] Patient Account Number: [id number] Date of Birth/Sex: Treating RN: 07-10-1970 (50 y.o. [name] Primary Care [name]: [name] Other Clinician: Referring [name]: Treating [name]/Extender: [name], [name] in Treatment: 21 Encounter Discharge Information Items Discharge Condition: Stable Ambulatory Status: Cane Discharge Destination: Home Transportation: Private Auto Accompanied By: self Schedule Follow-up Appointment:  Yes Clinical Summary of Care: Electronic Signature(s) Signed: 02/02/2021 5:27:57 PM By: [name] RN, BSN Entered By: [name] on 02/02/2021 12:08:04 -------------------------------------------------------------------------------- Negative Pressure Wound Therapy Maintenance (NPWT) Details Patient Name: Date of Service: [place] [name], [place]. 02/02/2021 11:00 A M Medical Record Number: [id number] Patient Account Number: [id number] Date of Birth/Sex: Treating RN: February 08, 1971 (50 y.o. [name] Primary Care [name]: [name] Other Clinician: Referring [place] Welcome: Treating Keerthana Vanrossum/Extender: [name], [name] in Treatment: 21 NPWT Maintenance Performed for: Wound #7 Right, Lateral, Plantar Foot Additional Injuries Covered: Yes Additional Injuries: Wound #3 Right, Proximal, Lateral Foot Performed By: [name], RN Type: VAC System Coverage Size (sq cm): 0.5 Pressure Type: Constant Pressure Setting: 125 mmHG Drain Type: None Primary Contact: Non-Adherent Sponge/Dressing Type: Gauze Date Initiated: 01/28/2021 Dressing Removed: Yes Quantity of Sponges/Gauze Removed: x1 gauze and x1 black foam removed Canister Changed: Yes Canister Exudate Volume: 25 Dressing Reapplied: Yes Quantity of Sponges/Gauze Inserted: see note Respones T Treatment: o tolerated well Days On NPWT : 6 Notes x1 gauze bridged between right lateral foot wounds then bridged to dorsal foot. Electronic Signature(s) Signed: 02/02/2021 5:27:57 PM By: [name] RN, BSN Entered By: [name] on 02/02/2021 12:07:15 -------------------------------------------------------------------------------- Patient/Caregiver Education Details Patient Name: Date of Service: [name], [place] 11/7/2022andnbsp11:00 A M Medical Record Number: [id number] Patient Account Number: [id number] Date of Birth/Gender: Treating RN: [birth year] (50 y.o. [name] Primary Care Physician:  [name] Other Clinician: Referring Physician: Treating Physician/Extender: [name] Weeks in Treatment: 21 Education Assessment Education Provided To: Patient Education Topics Provided Wound/Skin Impairment: Handouts: Skin Care Do's and Dont's Methods: Explain/Verbal Responses: Reinforcements needed Electronic Signature(s) Signed: 02/02/2021 5:27:57 PM By: [name] RN, BSN Signed: 02/02/2021 5:27:57 PM By: [name] RN, BSN Entered By: [name] on 02/02/2021 12:07:52 -------------------------------------------------------------------------------- Wound Assessment Details Patient Name: Date of Service: Joseph Hahn RD, [name] 02/02/2021 11:00 A M Medical Record Number: [id number] Patient Account Number: [id number] Date of Birth/Sex: Treating  RN: 1970-09-18 (50 y.o. [name] Primary Care [name]: [name] Other Clinician: Referring [name]: Treating [name]/Extender: [name], [name] in Treatment: 21 Wound Status Wound Number: 3 Primary Diabetic Wound/Ulcer of the Lower Extremity Etiology: Wound Location: Right, Proximal, Lateral Foot Wound Open Wounding Event: Gradually Appeared Status: Date Acquired: 10/29/2020 Comorbid Anemia, Coronary Artery Disease, Hypertension, Type II Weeks Of Treatment: 13 History: Diabetes, Osteomyelitis, Neuropathy Clustered Wound: No Wound Measurements Length: (cm) 0.5 Width: (cm) 0.5 Depth: (cm) 3 Area: (cm) 0.196 Volume: (cm) 0.589 % Reduction in Area: -55.6% % Reduction in Volume: -569.3% Wound Description Classification: Grade 3 Exudate Amount: Medium Exudate Type: Sanguinous Exudate Color: red Treatment Notes Wound #3 (Foot) Wound Laterality: Right, Lateral, Proximal Cleanser Soap and Water Discharge Instruction: May shower and wash wound with dial antibacterial soap and water prior to dressing change. Wound Cleanser Discharge Instruction: Cleanse the  wound with wound cleanser prior to applying a clean dressing using gauze sponges, not tissue or cotton balls. Peri-Wound Care Sween Lotion (Moisturizing lotion) Discharge Instruction: Apply moisturizing lotion to foot with dressing changes Topical Primary Dressing VAC Secondary Dressing Secured With Coban Self-Adherent Wrap 4x5 (in/yd) Discharge Instruction: Secure with Coban as directed. Kerlix Roll Sterile, 4.5x3.1 (in/yd) Discharge Instruction: Secure with Kerlix as directed. Compression Wrap Compression Stockings Add-Ons Electronic Signature(s) Signed: 02/02/2021 4:55:55 PM By: Dellie Catholic RN Signed: 02/02/2021 5:20:47 PM By: Lorrin Jackson Entered By: Dellie Catholic on 02/02/2021 11:01:55 -------------------------------------------------------------------------------- Wound Assessment Details Patient Name: Date of Service: [name], [place] [name] 02/02/2021 11:00 A M Medical Record Number: [id number] Patient Account Number: [id number] Date of Birth/Sex: Treating RN: 1971/03/16 (50 y.o. [name] Primary Care Onita Pfluger: [name] Other Clinician: Referring Aulani Shipton: Treating Draper Gallon/Extender: [name], [name] in Treatment: 21 Wound Status Wound Number: 7 Primary Diabetic Wound/Ulcer of the Lower Extremity Etiology: Wound Location: Right, Lateral, Plantar Foot Wound Open Wounding Event: Gradually Appeared Status: Date Acquired: 12/31/2020 Comorbid Anemia, Coronary Artery Disease, Hypertension, Type II Weeks Of Treatment: 4 History: Diabetes, Osteomyelitis, Neuropathy Clustered Wound: No Wound Measurements Length: (cm) 0.5 Width: (cm) 0.5 Depth: (cm) 1.1 Area: (cm) 0.196 Volume: (cm) 0.216 % Reduction in Area: 0% % Reduction in Volume: -10.2% Wound Description Classification: Grade 3 Exudate Amount: Medium Exudate Type: Sanguinous Exudate Color: red Treatment Notes Wound #7 (Foot) Wound Laterality: Plantar, Right,  Lateral Cleanser Soap and Water Discharge Instruction: May shower and wash wound with dial antibacterial soap and water prior to dressing change. Wound Cleanser Discharge Instruction: Cleanse the wound with wound cleanser prior to applying a clean dressing using gauze sponges, not tissue or cotton balls. Peri-Wound Care Sween Lotion (Moisturizing lotion) Discharge Instruction: Apply moisturizing lotion to foot with dressing changes Topical Primary Dressing VAC Secondary Dressing Secured With Coban Self-Adherent Wrap 4x5 (in/yd) Discharge Instruction: Secure with Coban as directed. Kerlix Roll Sterile, 4.5x3.1 (in/yd) Discharge Instruction: Secure with Kerlix as directed. Compression Wrap Compression Stockings Add-Ons Electronic Signature(s) Signed: 02/02/2021 4:55:55 PM By: Dellie Catholic RN Signed: 02/02/2021 5:20:47 PM By: Lorrin Jackson Entered By: Dellie Catholic on 02/02/2021 11:02:30 -------------------------------------------------------------------------------- Wound Assessment Details Patient Name: Date of Service: [name], [place] [name] 02/02/2021 11:00 A M Medical Record Number: [id number] Patient Account Number: [id number] Date of Birth/Sex: Treating RN: Dec 17, 1970 (50 y.o. [name] Primary Care Chivonne Rascon: [name] Other Clinician: Referring Marcques Wrightsman: Treating Donnelle Rubey/Extender: [name], [name] in Treatment: 21 Wound Status Wound Number: 8 Primary Trauma, Other Etiology: Wound Location: Right, Anterior Lower Leg Wound Open Wounding  Event: Trauma Status: Date Acquired: 01/16/2021 Comorbid Anemia, Coronary Artery Disease, Hypertension, Type II Weeks Of Treatment: 1 History: Diabetes, Osteomyelitis, Neuropathy Clustered Wound: No Wound Measurements Length: (cm) 0.7 Width: (cm) 0.3 Depth: (cm) 0.1 Area: (cm) 0.165 Volume: (cm) 0.016 % Reduction in Area: 80% % Reduction in Volume: 90.3% Wound  Description Classification: Full Thickness Without Exposed Support Structu Exudate Amount: Small Exudate Type: Serosanguineous Exudate Color: red, brown res Treatment Notes Wound #8 (Lower Leg) Wound Laterality: Right, Anterior Cleanser Peri-Wound Care Topical Primary Dressing Secondary Dressing Secured With Compression Wrap Compression Stockings Add-Ons Electronic Signature(s) Signed: 02/02/2021 4:55:55 PM By: Dellie Catholic RN Signed: 02/02/2021 5:20:47 PM By: Lorrin Jackson Entered By: Dellie Catholic on 02/02/2021 11:02:59 -------------------------------------------------------------------------------- Vitals Details Patient Name: Date of Service: Joseph Hahn RD, [name] 02/02/2021 11:00 A M Medical Record Number: [id number] Patient Account Number: [id number] Date of Birth/Sex: Treating RN: 1970/05/06 (50 y.o. [name] Primary Care Raistlin Gum: [name] Other Clinician: Referring Kaelem Brach: Treating Zohar Maroney/Extender: [name], [name] in Treatment: 21 Vital Signs Time Taken: 10:50 Temperature (F): 98.1 Height (in): 65 Pulse (bpm): 89 Weight (lbs): 196 Respiratory Rate (breaths/min): 16 Body Mass Index (BMI): 32.6 Blood Pressure (mmHg): 134/73 Reference Range: 80 - 120 mg / dl Electronic Signature(s) Signed: 02/02/2021 4:55:55 PM By: Dellie Catholic RN Entered By: Dellie Catholic on 02/02/2021 11:00:31

## 2021-02-02 NOTE — Progress Notes (Signed)
ADVIK, WEATHERSPOON ([id number]) Visit Report for 02/02/2021 HBO Details Patient Name: Date of Service: [address], Accident. 02/02/2021 8:00 A M Medical Record Number: [id number] Patient Account Number: [id number] Date of Birth/Sex: Treating RN: 1970/06/21 (50 y.o. [name] Primary Care Depaul Arizpe: [name] Other Clinician: Referring Hebe Merriwether: Treating Wynne Jury/Extender: [name], [name] in Treatment: 21 HBO Treatment Course Details Treatment Course Number: 1 Ordering Rosi Secrist: [name] Treatments Ordered: otal 40 HBO Treatment Start Date: 12/08/2020 HBO Indication: Diabetic Ulcer(s) of the Lower Extremity HBO Treatment Details Treatment Number: 27 Patient Type: Outpatient Chamber Type: Monoplace Chamber Serial #: [id number] Treatment Protocol: 2.5 ATA with 90 minutes oxygen, with two 5 minute air breaks Treatment Details Compression Rate Down: 2.0 psi / minute De-Compression Rate Up: 2.0 psi / minute A breaks and breathing ir Compress Tx Pressure periods Decompress Decompress Begins Reached (leave unused spaces Begins Ends blank) Chamber Pressure (ATA 1 2.5 2.5 2.5 2.5 2.5 - - 2.5 1 ) Clock Time (24 hr) 08:26 08:38 09:08 09:13 09:43 09:48 - - 10:18 10:30 Treatment Length: 124 (minutes) Treatment Segments: 4 Vital Signs Capillary Blood Glucose Reference Range: 80 - 120 mg / dl HBO Diabetic Blood Glucose Intervention Range: <131 mg/dl or >249 mg/dl Time Vitals Blood Respiratory Capillary Blood Glucose Pulse Action Type: Pulse: Temperature: Taken: Pressure: Rate: Glucose (mg/dl): Meter #: Oximetry (%) Taken: Pre 07:58 134/73 89 16 98.1 250 Post 10:30 123/100 82 16 97.5 115 Treatment Response Treatment Completion Status: Treatment Completed without Adverse Event Additional Procedure Documentation Tissue Sevierity: Fat layer exposed Physician HBO Attestation: I certify that I supervised this HBO treatment in accordance with  Medicare guidelines. A trained emergency response team is readily available per Yes hospital policies and procedures. Continue HBOT as ordered. Yes Electronic Signature(s) Signed: 02/02/2021 3:06:40 PM By: Kalman Shan DO Previous Signature: 02/02/2021 12:54:29 PM Version By: Levan Hurst RN, BSN Entered By: Kalman Shan on 02/02/2021 15:04:58 -------------------------------------------------------------------------------- HBO Safety Checklist Details Patient Name: Date of Service: [name], [place] [name] 02/02/2021 8:00 A M Medical Record Number: [id number] Patient Account Number: [id number] Date of Birth/Sex: Treating RN: [birth year] (50 y.o. [name] Primary Care Misty Rago: [name] Other Clinician: Referring Keerthana Vanrossum: Treating Jayan Raymundo/Extender: [name], [name] in Treatment: 21 HBO Safety Checklist Items Safety Checklist Consent Form Signed Patient voided / foley secured and emptied When did you last eato 0630 Last dose of injectable or oral agent 02/01/21 Ostomy pouch emptied and vented if applicable NA All implantable devices assessed, documented and approved NA Intravenous access site secured and place NA Valuables secured Linens and cotton and cotton/polyester blend (less than 51% polyester) Personal oil-based products / skin lotions / body lotions removed Wigs or hairpieces removed Smoking or tobacco materials removed Books / newspapers / magazines / loose paper removed Cologne, aftershave, perfume and deodorant removed Jewelry removed (may wrap wedding band) Make-up removed Hair care products removed Battery operated devices (external) removed Heating patches and chemical warmers removed Titanium eyewear removed Nail polish cured greater than 10 hours Casting material cured greater than 10 hours NA Hearing aids removed Loose dentures or partials removed Prosthetics have been removed NA Patient demonstrates correct use  of air break device (if applicable) Patient concerns have been addressed Patient grounding bracelet on and cord attached to chamber Specifics for Inpatients (complete in addition to above) Medication sheet sent with patient NA Intravenous medications needed or due during therapy sent with patient NA Drainage tubes (e.g. nasogastric tube  or chest tube secured and vented) NA Endotracheal or Tracheotomy tube secured NA Cuff deflated of air and inflated with saline NA Airway suctioned NA Electronic Signature(s) Signed: 02/02/2021 12:54:29 PM By: Levan Hurst RN, BSN Entered By: Levan Hurst on 02/02/2021 08:32:55

## 2021-02-03 ENCOUNTER — Encounter (HOSPITAL_BASED_OUTPATIENT_CLINIC_OR_DEPARTMENT_OTHER): Payer: 59 | Admitting: Internal Medicine

## 2021-02-03 DIAGNOSIS — E11621 Type 2 diabetes mellitus with foot ulcer: Secondary | ICD-10-CM | POA: Diagnosis not present

## 2021-02-03 LAB — GLUCOSE, CAPILLARY
Glucose-Capillary: 237 mg/dL — ABNORMAL HIGH (ref 70–99)
Glucose-Capillary: 118 mg/dL — ABNORMAL HIGH (ref 70–99)

## 2021-02-03 NOTE — Progress Notes (Signed)
[name], [name] ([id number]) Visit Report for [name] HBO Details Patient Name: Date of Service: [name] RD, [name] [facility] [name] 8:00 A M Medical Record Number: [id number] Patient Account Number: [id number] Date of Birth/Sex: Treating RN: May 17, 1970 (50 y.o. [name] Primary Care [name]: [name] Other Clinician: Referring [name]: Treating [name]/Extender: [name], [name] in Treatment: 21 HBO Treatment Course Details Treatment Course Number: 1 Ordering [name]: [name] Treatments Ordered: otal 40 HBO Treatment Start Date: 12/08/2020 HBO Indication: Diabetic Ulcer(s) of the Lower Extremity HBO Treatment Details Treatment Number: 28 Patient Type: Outpatient Chamber Type: Monoplace Chamber Serial #: [id number] Treatment Protocol: 2.5 ATA with 90 minutes oxygen, with two 5 minute air breaks Treatment Details Compression Rate Down: 2.0 psi / minute De-Compression Rate Up: 2.0 psi / minute A breaks and breathing ir Compress Tx Pressure periods Decompress Decompress Begins Reached (leave unused spaces Begins Ends blank) Chamber Pressure (ATA 1 2.5 2.5 2.5 2.5 2.5 - - 2.5 1 ) Clock Time (24 hr) 08:05 08:17 08:47 08:52 09:22 09:27 - - 09:57 10:09 Treatment Length: 124 (minutes) Treatment Segments: 4 Vital Signs Capillary Blood Glucose Reference Range: 80 - 120 mg / dl HBO Diabetic Blood Glucose Intervention Range: <131 mg/dl or >249 mg/dl Time Vitals Blood Respiratory Capillary Blood Glucose Pulse Action Type: Pulse: Temperature: Taken: Pressure: Rate: Glucose (mg/dl): Meter #: Oximetry (%) Taken: Pre 07:50 136/81 90 16 98.2 237 Post 10:09 142/87 79 16 97.6 118 Treatment Response Treatment Completion Status: Treatment Completed without Adverse Event Additional Procedure Documentation Tissue Sevierity: Fat layer exposed [name] Notes No concerns with treatment given Physician HBO Attestation: I certify that I supervised  this HBO treatment in accordance with Medicare guidelines. A trained emergency response team is readily available per Yes hospital policies and procedures. Continue HBOT as ordered. Yes Electronic Signature(s) Signed: 02/03/2021 4:39:28 PM By: [name] Entered By: [name] 16:01:54 -------------------------------------------------------------------------------- HBO Safety Checklist Details Patient Name: Date of Service: [name] RD, [place] [facility] [name] 8:00 A M Medical Record Number: [id number] Patient Account Number: [id number] Date of Birth/Sex: Treating RN: 06-Jul-1970 (50 y.o. [name] Primary Care [name]: [name] Other Clinician: Referring [name]: Treating [name]/Extender: [name], [name] in Treatment: 21 HBO Safety Checklist Items Safety Checklist Consent Form Signed Patient voided / foley secured and emptied When did you last eato 0630 Last dose of injectable or oral agent This AM Ostomy pouch emptied and vented if applicable NA All implantable devices assessed, documented and approved NA Intravenous access site secured and place NA Valuables secured Linens and cotton and cotton/polyester blend (less than 51% polyester) Personal oil-based products / skin lotions / body lotions removed Wigs or hairpieces removed Smoking or tobacco materials removed Books / newspapers / magazines / loose paper removed Cologne, aftershave, perfume and deodorant removed Jewelry removed (may wrap wedding band) Make-up removed Hair care products removed Battery operated devices (external) removed Heating patches and chemical warmers removed Titanium eyewear removed Nail polish cured greater than 10 hours NA Casting material cured greater than 10 hours NA Hearing aids removed Loose dentures or partials removed Prosthetics have been removed NA Patient demonstrates correct use of air break device (if  applicable) Patient concerns have been addressed Patient grounding bracelet on and cord attached to chamber Specifics for Inpatients (complete in addition to above) Medication sheet sent with patient NA Intravenous medications needed or due during therapy sent with patient NA Drainage tubes (e.g. nasogastric tube or chest  tube secured and vented) NA Endotracheal or Tracheotomy tube secured NA Cuff deflated of air and inflated with saline NA Airway suctioned NA Electronic Signature(s) Signed: 02/03/2021 5:21:59 PM By: [name] Entered By: Joseph Hurst on [name] 13:23:53

## 2021-02-03 NOTE — Progress Notes (Signed)
[name], [name] ([id number]) Visit Report for 02/03/2021 Arrival Information Details Patient Name: Date of Service: [place] RD, [name] 02/03/2021 8:00 A M Medical Record Number: [id number] Patient Account Number: [id number] Date of Birth/Sex: Treating RN: January 26, 1971 (50 y.o. [name] Primary Care Issak Goley: [name] Other Clinician: Referring Deashia Soule: Treating Lakena Sparlin/Extender: [name], [name] in Treatment: 21 Visit Information History Since Last Visit Added or deleted any medications: No Patient Arrived: [name] Any new allergies or adverse reactions: No Arrival Time: 07:50 Had a fall or experienced change in No Accompanied By: alone activities of daily living that may affect Transfer Assistance: None risk of falls: Patient Identification Verified: Yes Signs or symptoms of abuse/neglect since last visito No Secondary Verification Process Completed: Yes Hospitalized since last visit: No Patient Requires Transmission-Based Precautions: No Implantable device outside of the clinic excluding No Patient Has Alerts: No cellular tissue based products placed in the center since last visit: Has Dressing in Place as Prescribed: Yes Pain Present Now: No Electronic Signature(s) Signed: 02/03/2021 5:21:59 PM By: Levan Hurst RN, BSN Entered By: Levan Hurst on 02/03/2021 13:09:00 -------------------------------------------------------------------------------- Encounter Discharge Information Details Patient Name: Date of Service: [name], [place] 02/03/2021 8:00 A M Medical Record Number: [id number] Patient Account Number: [id number] Date of Birth/Sex: Treating RN: 11-Mar-1971 (50 y.o. [name] Primary Care Marvin Maenza: [name] Other Clinician: Referring Kristi Norment: Treating Sammuel Blick/Extender: [name], [name] in Treatment: 21 Encounter Discharge Information Items Discharge Condition: Stable Ambulatory Status:  Ambulatory Discharge Destination: Home Transportation: Private Auto Accompanied By: alone Schedule Follow-up Appointment: Yes Clinical Summary of Care: Patient Declined Electronic Signature(s) Signed: 02/03/2021 5:21:59 PM By: Levan Hurst RN, BSN Entered By: Levan Hurst on 02/03/2021 13:28:13 -------------------------------------------------------------------------------- [place] Details Patient Name: Date of Service: [name], [place]. 02/03/2021 8:00 A M Medical Record Number: [id number] Patient Account Number: [id number] Date of Birth/Sex: Treating RN: [birth year] (50 y.o. [name] Primary Care Susy Placzek: [name] Other Clinician: Referring Gaege Sangalang: Treating Loula Marcella/Extender: [name], [name] in Treatment: 21 Vital Signs Time Taken: 07:50 Temperature (F): 98.2 Height (in): 65 Pulse (bpm): 90 Weight (lbs): 196 Respiratory Rate (breaths/min): 16 Body Mass Index (BMI): 32.6 Blood Pressure (mmHg): 136/81 Capillary Blood Glucose (mg/dl): 237 Reference Range: 80 - 120 mg / dl Electronic Signature(s) Signed: 02/03/2021 5:21:59 PM By: Levan Hurst RN, BSN Entered By: Levan Hurst on 02/03/2021 13:22:14

## 2021-02-03 NOTE — Progress Notes (Signed)
[name], [name] ([id number]) Visit Report for 02/03/2021 [place] Details Patient Name: Date of Service: [place] RD, [name] 02/03/2021 Medical Record Number: [id number] Patient Account Number: [id number] Date of Birth/Sex: Treating RN: [birth year] (51 y.o. [name] Primary Care Provider: [name] Other Clinician: Referring Provider: Treating Provider/Extender: [name], [name] in Treatment: 21 Diagnosis Coding ICD-10 Codes Code Description [phone] Other chronic osteomyelitis, right ankle and foot E11.621 Type 2 diabetes mellitus with foot ulcer L97.514 Non-pressure chronic ulcer of other part of right foot with necrosis of bone T81.31XS Disruption of external operation (surgical) wound, not elsewhere classified, sequela F17.218 Nicotine dependence, cigarettes, with other nicotine-induced disorders L97.522 Non-pressure chronic ulcer of other part of left foot with fat layer exposed L84 Corns and callosities Facility Procedures CPT4 Code Description Modifier Quantity [id number] G0277-(Facility Use Only) HBOT full body chamber, 57min , 4 Physician Procedures Quantity CPT4 Code Description Modifier [id number] 16010 - WC PHYS HYPERBARIC OXYGEN THERAPY 1 ICD-10 Diagnosis Description E11.621 Type 2 diabetes mellitus with foot ulcer M86.671 Other chronic osteomyelitis, right ankle and foot Electronic Signature(s) Signed: 02/03/2021 4:39:28 PM By: [name] MD Signed: 02/03/2021 5:21:59 PM By: Levan Hurst RN, BSN Entered By: Levan Hurst on 02/03/2021 13:27:08

## 2021-02-04 ENCOUNTER — Other Ambulatory Visit: Payer: Self-pay

## 2021-02-04 ENCOUNTER — Encounter (HOSPITAL_BASED_OUTPATIENT_CLINIC_OR_DEPARTMENT_OTHER): Payer: 59 | Admitting: Physician Assistant

## 2021-02-04 ENCOUNTER — Encounter: Payer: 59 | Admitting: Family

## 2021-02-04 DIAGNOSIS — E1142 Type 2 diabetes mellitus with diabetic polyneuropathy: Secondary | ICD-10-CM

## 2021-02-04 DIAGNOSIS — E11621 Type 2 diabetes mellitus with foot ulcer: Secondary | ICD-10-CM | POA: Diagnosis not present

## 2021-02-04 LAB — GLUCOSE, CAPILLARY
Glucose-Capillary: 186 mg/dL — ABNORMAL HIGH (ref 70–99)
Glucose-Capillary: 145 mg/dL — ABNORMAL HIGH (ref 70–99)

## 2021-02-04 NOTE — Progress Notes (Addendum)
[name], [name] ([id number]) Visit Report for 02/04/2021 HBO Details Patient Name: Date of Service: [name], [name]. 02/04/2021 8:00 A M Medical Record Number: [id number] Patient Account Number: [id number] Date of Birth/Sex: Treating RN: [birth year] (50 y.o. [name] Primary Care [name]: [name] Other Clinician: Donavan [name] Referring [name]: Treating [name]/Extender: [name], [name] in Treatment: 21 HBO Treatment Course Details Treatment Course Number: 1 Ordering [name]: Joseph Keeler T Treatments Ordered: otal 40 HBO Treatment Start Date: 12/08/2020 HBO Indication: Diabetic Ulcer(s) of the Lower Extremity HBO Treatment Details Treatment Number: 29 Patient Type: Outpatient Chamber Type: Monoplace Chamber Serial #: [id number] Treatment Protocol: 2.5 ATA with 90 minutes oxygen, with two 5 minute air breaks Treatment Details Compression Rate Down: 2.5 psi / minute De-Compression Rate Up: 2.5 psi / minute A breaks and breathing ir Compress Tx Pressure periods Decompress Decompress Begins Reached (leave unused spaces Begins Ends blank) Chamber Pressure (ATA 1 2.5 2.5 2.5 2.5 2.5 - - 2.5 1 ) Clock Time (24 hr) 08:35 09:44 09:14 09:19 09:49 09:54 - - 10:24 10:33 Treatment Length: 118 (minutes) Treatment Segments: 4 Vital Signs Capillary Blood Glucose Reference Range: 80 - 120 mg / dl HBO Diabetic Blood Glucose Intervention Range: <131 mg/dl or >249 mg/dl Time Vitals Blood Respiratory Capillary Blood Glucose Pulse Action Type: Pulse: Temperature: Taken: Pressure: Rate: Glucose (mg/dl): Meter #: Oximetry (%) Taken: Pre 08:22 150/98 92 16 98.4 186 100 Post 10:41 142/96 83 14 97.9 145 Treatment Response Treatment Toleration: Well Treatment Completion Status: Treatment Completed without Adverse Event Additional Procedure Documentation Tissue Sevierity: Fat layer exposed Electronic Signature(s) Signed: 02/04/2021 11:04:30 AM  By: [name] [name] , , Signed: 02/12/2021 5:50:22 PM By: [name] Entered By: [name] on 02/04/2021 11:04:29 -------------------------------------------------------------------------------- HBO Safety Checklist Details Patient Name: Date of Service: [name], [name] 02/04/2021 8:00 A M Medical Record Number: [id number] Patient Account Number: [id number] Date of Birth/Sex: Treating RN: 07/31/1970 (50 y.o. [name] Primary Care [name]: [name] Other Clinician: Donavan [name] Referring [name]: Treating [name]/Extender: [name], [name] in Treatment: 21 HBO Safety Checklist Items Safety Checklist Consent Form Signed Patient voided / foley secured and emptied When did you last eato Breakfast Last dose of injectable or oral agent 0630 Ostomy pouch emptied and vented if applicable NA All implantable devices assessed, documented and approved NA Intravenous access site secured and place NA Valuables secured Linens and cotton and cotton/polyester blend (less than 51% polyester) Personal oil-based products / skin lotions / body lotions removed Wigs or hairpieces removed NA Smoking or tobacco materials removed NA Books / newspapers / magazines / loose paper removed Cologne, aftershave, perfume and deodorant removed Jewelry removed (may wrap wedding band) NA Make-up removed NA Hair care products removed Battery operated devices (external) removed Heating patches and chemical warmers removed NA Titanium eyewear removed NA Nail polish cured greater than 10 hours NA Casting material cured greater than 10 hours Hearing aids removed Loose dentures or partials removed NA Prosthetics have been removed NA Patient demonstrates correct use of air break device (if applicable) Patient concerns have been addressed Patient grounding bracelet on and cord attached to chamber Specifics for Inpatients  (complete in addition to above) Medication sheet sent with patient NA Intravenous medications needed or due during therapy sent with patient NA Drainage tubes (e.g. nasogastric tube or chest tube secured and vented) NA Endotracheal or Tracheotomy tube secured NA Cuff deflated of  air and inflated with saline NA Airway suctioned NA Notes Paper version used prior to treatment. Electronic Signature(s) Signed: 02/04/2021 12:09:46 PM By: [name] [name] , , Previous Signature: 02/04/2021 11:02:29 AM Version By: [name] [name] , , Entered By: [name] on 02/04/2021 12:09:45

## 2021-02-04 NOTE — Progress Notes (Addendum)
AYINDE, SWIM ([id number]) Visit Report for 02/04/2021 Arrival Information Details Patient Name: Date of Service: [address] [place] [address], [name] 02/04/2021 10:30 A M Medical Record Number: [id number] Patient Account Number: [id number] Date of Birth/Sex: Treating RN: [birth year] (50 y.o. [name], [name] Primary Care Oryan Winterton: [name] Other Clinician: Referring Concetta Guion: Treating Saleem Coccia/Extender: [name], [name] in Treatment: 21 Visit Information History Since Last Visit Added or deleted any medications: No Patient Arrived: Ambulatory Any new allergies or adverse reactions: No Arrival Time: 11:04 Had a fall or experienced change in No Accompanied By: self activities of daily living that may affect Transfer Assistance: None risk of falls: Patient Identification Verified: Yes Signs or symptoms of abuse/neglect since last visito No Secondary Verification Process Completed: Yes Hospitalized since last visit: No Patient Requires Transmission-Based Precautions: No Implantable device outside of the clinic excluding No Patient Has Alerts: No cellular tissue based products placed in the center since last visit: Has Dressing in Place as Prescribed: Yes Pain Present Now: Yes Electronic Signature(s) Signed: 02/04/2021 5:03:19 PM By: Baruch Gouty RN, BSN Entered By: Baruch Gouty on 02/04/2021 11:05:18 -------------------------------------------------------------------------------- Complex / Palliative Patient Assessment Details Patient Name: Date of Service: [name], [place]. 02/04/2021 10:30 A M Medical Record Number: [id number] Patient Account Number: [id number] Date of Birth/Sex: Treating RN: [birth year] (50 y.o. [name] Primary Care Cyanna Neace: [name] Other Clinician: Referring Huel Centola: Treating Amrit Cress/Extender: [name], [name] in Treatment: 21 Palliative Management Criteria Complex Wound Management Criteria Patient  has remarkable or complex co-morbidities requiring medications or treatments that extend wound healing times. Examples: Diabetes mellitus with chronic renal failure or end stage renal disease requiring dialysis Advanced or poorly controlled rheumatoid arthritis Diabetes mellitus and end stage chronic obstructive pulmonary disease Active cancer with current chemo- or radiation therapy Type 2 Diabetes, Osteomyelitis Care Approach Wound Care Plan: Complex Wound Management Electronic Signature(s) Signed: 02/05/2021 5:34:00 PM By: Levan Hurst RN, BSN Signed: 02/12/2021 5:50:22 PM By: Worthy Keeler PA-C Entered By: Levan Hurst on 02/05/2021 13:21:14 -------------------------------------------------------------------------------- Encounter Discharge Information Details Patient Name: Date of Service: [name], [place] 02/04/2021 10:30 A M Medical Record Number: [id number] Patient Account Number: [id number] Date of Birth/Sex: Treating RN: 1970/08/11 (50 y.o. [name] Primary Care Elion Hocker: [name] Other Clinician: Referring Ruthe Roemer: Treating Briteny Fulghum/Extender: [name], [name] in Treatment: 21 Encounter Discharge Information Items Post Procedure Vitals Discharge Condition: Stable Temperature (F): 97.9 Ambulatory Status: Ambulatory Pulse (bpm): 83 Discharge Destination: Home Respiratory Rate (breaths/min): 18 Transportation: Private Auto Blood Pressure (mmHg): 142/96 Accompanied By: self Schedule Follow-up Appointment: Yes Clinical Summary of Care: Patient Declined Electronic Signature(s) Signed: 02/04/2021 5:03:19 PM By: Baruch Gouty RN, BSN Entered By: Baruch Gouty on 02/04/2021 12:03:54 -------------------------------------------------------------------------------- Lower Extremity Assessment Details Patient Name: Date of Service: [name], [name] 02/04/2021 10:30 A M Medical Record Number: [id number] Patient Account Number:  [id number] Date of Birth/Sex: Treating RN: 27-Oct-1970 (50 y.o. [name] Primary Care Helmuth Recupero: [name] Other Clinician: Referring Elieser Tetrick: Treating Elisavet Buehrer/Extender: [name], [name] in Treatment: 21 Edema Assessment Assessed: [Left: No] [Right: No] Edema: [Left: N] [Right: o] Calf Left: Right: Point of Measurement: From Medial Instep 34.3 cm Ankle Left: Right: Point of Measurement: From Medial Instep 22 cm Vascular Assessment Pulses: Dorsalis Pedis Palpable: [Right:No] Electronic Signature(s) Signed: 02/04/2021 5:03:19 PM By: Baruch Gouty RN, BSN Entered By: Baruch Gouty on 02/04/2021 11:06:41 -------------------------------------------------------------------------------- [facility] Details Patient Name: Date  of Service: [name], [name] 02/04/2021 10:30 A M Medical Record Number: [id number] Patient Account Number: [id number] Date of Birth/Sex: Treating RN: 1970-04-10 (50 y.o. [name] Primary Care Eamonn Sermeno: [name] Other Clinician: Referring Donyale Berthold: Treating Krystyl Cannell/Extender: [name], [name] in Treatment: 21 Active Inactive HBO Nursing Diagnoses: Anxiety related to feelings of confinement associated with the hyperbaric oxygen chamber Anxiety related to knowledge deficit of hyperbaric oxygen therapy and treatment procedures Potential for barotraumas to ears, sinuses, teeth, and lungs or cerebral gas embolism related to changes in atmospheric pressure inside hyperbaric oxygen chamber Potential for oxygen toxicity seizures related to delivery of 100% oxygen at an increased atmospheric pressure Potential for pulmonary oxygen toxicity related to delivery of 100% oxygen at an increased atmospheric pressure Goals: Barotrauma will be prevented during HBO2 Date Initiated: 01/28/2021 Target Resolution Date: 02/25/2021 Goal Status: Active Patient and/or family will be able  to state/discuss factors appropriate to the management of their disease process during treatment Date Initiated: 01/28/2021 Target Resolution Date: 02/25/2021 Goal Status: Active Patient will tolerate the hyperbaric oxygen therapy treatment Date Initiated: 01/28/2021 Target Resolution Date: 02/25/2021 Goal Status: Active Interventions: Administer the correct therapeutic gas delivery based on the patients needs and limitations, per physician order Assess and provide for patients comfort related to the hyperbaric environment and equalization of middle ear Assess for signs and symptoms related to adverse events, including but not limited to confinement anxiety, pneumothorax, oxygen toxicity and baurotrauma Notes: Osteomyelitis Nursing Diagnoses: Infection: osteomyelitis Knowledge deficit related to disease process and management Goals: Patient/caregiver will verbalize understanding of disease process and disease management Date Initiated: 11/26/2020 Target Resolution Date: 02/24/2021 Goal Status: Active Patient's osteomyelitis will resolve Date Initiated: 11/26/2020 Target Resolution Date: 02/24/2021 Goal Status: Active Interventions: Assess for signs and symptoms of osteomyelitis resolution every visit Provide education on osteomyelitis Screen for HBO Treatment Activities: Systemic antibiotics : 11/26/2020 Notes: Wound/Skin Impairment Nursing Diagnoses: Impaired tissue integrity Goals: Patient/caregiver will verbalize understanding of skin care regimen Date Initiated: 09/05/2020 Target Resolution Date: 02/24/2021 Goal Status: Active Ulcer/skin breakdown will have a volume reduction of 30% by week 4 Date Initiated: 09/05/2020 Date Inactivated: 10/08/2020 Target Resolution Date: 10/03/2020 Goal Status: Met Interventions: Assess patient/caregiver ability to obtain necessary supplies Assess patient/caregiver ability to perform ulcer/skin care regimen upon admission and as  needed Assess ulceration(s) every visit Provide education on ulcer and skin care Treatment Activities: Patient referred to home care : 09/05/2020 Skin care regimen initiated : 09/05/2020 Topical wound management initiated : 09/05/2020 Notes: Electronic Signature(s) Signed: 02/04/2021 5:03:19 PM By: Baruch Gouty RN, BSN Entered By: Baruch Gouty on 02/04/2021 11:34:10 -------------------------------------------------------------------------------- Negative Pressure Wound Therapy Maintenance (NPWT) Details Patient Name: Date of Service: [place] [address], [name] 02/04/2021 10:30 A M Medical Record Number: [id number] Patient Account Number: [id number] Date of Birth/Sex: Treating RN: 04-20-1970 (50 y.o. [name] Primary Care Sander Remedios: [name] Other Clinician: Referring Helton Oleson: Treating Serafina Topham/Extender: [name], [name] in Treatment: 21 NPWT Maintenance Performed for: Wound #7 Right, Lateral, Plantar Foot Additional Injuries Covered: Yes Additional Injuries: Wound #3 Right, Proximal, Lateral Foot Performed By: Baruch Gouty, RN Type: VAC System Coverage Size (sq cm): 0.75 Pressure Type: Constant Pressure Setting: 125 mmHG Drain Type: None Sponge/Dressing Type: Gauze Date Initiated: 01/28/2021 Dressing Removed: No Quantity of Sponges/Gauze Removed: 2 Canister Changed: No Dressing Reapplied: No Quantity of Sponges/Gauze Inserted: 2 Respones T Treatment: o good Days On NPWT : 8 Post Procedure Diagnosis Same as Pre-procedure Electronic Signature(s)  Signed: 02/04/2021 5:03:19 PM By: Baruch Gouty RN, BSN Entered By: Baruch Gouty on 02/04/2021 11:38:02 -------------------------------------------------------------------------------- Pain Assessment Details Patient Name: Date of Service: [name], [name] 02/04/2021 10:30 A M Medical Record Number: [id number] Patient Account Number: [id number] Date of Birth/Sex: Treating RN: 09-05-1970  (50 y.o. [name] Primary Care Yosgart Pavey: [name] Other Clinician: Referring Yarden Hillis: Treating Barby Colvard/Extender: [name], [name] in Treatment: 21 Active Problems Location of Pain Severity and Description of Pain Patient Has Paino Yes Site Locations Pain Location: Pain in Ulcers With Dressing Change: Yes Duration of the Pain. Constant / Intermittento Intermittent Rate the pain. Current Pain Level: 0 Worst Pain Level: 4 Least Pain Level: 0 Character of Pain Describe the Pain: Tender Pain Management and Medication Current Pain Management: Other: tolerable Is the Current Pain Management Adequate: Adequate How does your wound impact your activities of daily livingo Sleep: No Bathing: No Appetite: No Relationship With Others: No Bladder Continence: No Emotions: No Bowel Continence: No Work: No Toileting: No Drive: No Dressing: No Hobbies: No Notes reports tender to touch and with ambulation Electronic Signature(s) Signed: 02/04/2021 5:03:19 PM By: Baruch Gouty RN, BSN Entered By: Baruch Gouty on 02/04/2021 11:06:19 -------------------------------------------------------------------------------- Patient/Caregiver Education Details Patient Name: Date of Service: [name], [name] 11/9/2022andnbsp10:30 A M Medical Record Number: [id number] Patient Account Number: [id number] Date of Birth/Gender: Treating RN: 01-09-1971 (50 y.o. [name] Primary Care Physician: [name] Other Clinician: Referring Physician: Treating Physician/Extender: [name], [name] in Treatment: 21 Education Assessment Education Provided To: Patient Education Topics Provided Hyperbaric Oxygenation: Methods: Explain/Verbal Responses: Reinforcements needed, State content correctly Infection: Methods: Explain/Verbal Responses: Reinforcements needed, State content correctly Wound/Skin Impairment: Methods:  Explain/Verbal Responses: Reinforcements needed, State content correctly Electronic Signature(s) Signed: 02/04/2021 5:03:19 PM By: Baruch Gouty RN, BSN Entered By: Baruch Gouty on 02/04/2021 11:35:29 -------------------------------------------------------------------------------- Wound Assessment Details Patient Name: Date of Service: [name], [name] 02/04/2021 10:30 A M Medical Record Number: [id number] Patient Account Number: [id number] Date of Birth/Sex: Treating RN: 03-Nov-1970 (50 y.o. [name] Primary Care Evonne Rinks: [name] Other Clinician: Referring Amine Adelson: Treating Zianna Dercole/Extender: [name], [name] in Treatment: 21 Wound Status Wound Number: 3 Primary Diabetic Wound/Ulcer of the Lower Extremity Etiology: Wound Location: Right, Proximal, Lateral Foot Wound Open Wounding Event: Gradually Appeared Status: Date Acquired: 10/29/2020 Comorbid Anemia, Coronary Artery Disease, Hypertension, Type II Weeks Of Treatment: 14 History: Diabetes, Osteomyelitis, Neuropathy Clustered Wound: No Photos Wound Measurements Length: (cm) 0.5 Width: (cm) 0.6 Depth: (cm) 2.1 Area: (cm) 0.236 Volume: (cm) 0.495 % Reduction in Area: -87.3% % Reduction in Volume: -462.5% Epithelialization: None Undermining: Yes Starting Position (o'clock): 12 Ending Position (o'clock): 12 Maximum Distance: (cm) 1.7 Wound Description Classification: Grade 3 Wound Margin: Well defined, not attached Exudate Amount: Medium Exudate Type: Sanguinous Exudate Color: red Foul Odor After Cleansing: No Slough/Fibrino No Wound Bed Granulation Amount: Large (67-100%) Exposed Structure Granulation Quality: Red Fascia Exposed: No Necrotic Amount: None Present (0%) Fat Layer (Subcutaneous Tissue) Exposed: Yes Tendon Exposed: No Muscle Exposed: No Joint Exposed: No Bone Exposed: No Electronic Signature(s) Signed: 02/04/2021 5:03:19 PM By: Baruch Gouty RN,  BSN Entered By: Baruch Gouty on 02/04/2021 11:18:16 -------------------------------------------------------------------------------- Wound Assessment Details Patient Name: Date of Service: [name], [place] 02/04/2021 10:30 A M Medical Record Number: [id number] Patient Account Number: [id number] Date of Birth/Sex: Treating RN: [birth year] (50 y.o. [name] Primary Care Meade Hogeland: [name] Other Clinician: Referring Kaisyn Reinhold:  Treating Quintell Bonnin/Extender: [name], [name] in Treatment: 21 Wound Status Wound Number: 7 Primary Diabetic Wound/Ulcer of the Lower Extremity Etiology: Wound Location: Right, Lateral, Plantar Foot Wound Open Wounding Event: Gradually Appeared Status: Date Acquired: 12/31/2020 Comorbid Anemia, Coronary Artery Disease, Hypertension, Type II Weeks Of Treatment: 5 History: Diabetes, Osteomyelitis, Neuropathy Clustered Wound: No Photos Wound Measurements Length: (cm) 0.9 Width: (cm) 0.5 Depth: (cm) 1 Area: (cm) 0.353 Volume: (cm) 0.353 % Reduction in Area: -80.1% % Reduction in Volume: -80.1% Tunneling: No Undermining: Yes Starting Position (o'clock): 12 Ending Position (o'clock): 1 Maximum Distance: (cm) 1.4 Wound Description Classification: Grade 3 Wound Margin: Well defined, not attached Exudate Amount: Medium Exudate Type: Sanguinous Exudate Color: red Foul Odor After Cleansing: No Slough/Fibrino No Wound Bed Granulation Amount: Large (67-100%) Exposed Structure Granulation Quality: Red Fascia Exposed: No Necrotic Amount: None Present (0%) Fat Layer (Subcutaneous Tissue) Exposed: Yes Tendon Exposed: No Muscle Exposed: No Joint Exposed: No Bone Exposed: No Electronic Signature(s) Signed: 02/04/2021 5:03:19 PM By: Baruch Gouty RN, BSN Entered By: Baruch Gouty on 02/04/2021 11:19:22 -------------------------------------------------------------------------------- Wound Assessment  Details Patient Name: Date of Service: [name], [place] 02/04/2021 10:30 A M Medical Record Number: [id number] Patient Account Number: [id number] Date of Birth/Sex: Treating RN: 08-24-1970 (50 y.o. [name] Primary Care Margart Zemanek: [name] Other Clinician: Referring Jerrelle Michelsen: Treating Jeziah Kretschmer/Extender: [name], [name] in Treatment: 21 Wound Status Wound Number: 8 Primary Trauma, Other Etiology: Wound Location: Right, Anterior Lower Leg Wound Open Wounding Event: Trauma Status: Date Acquired: 01/16/2021 Comorbid Anemia, Coronary Artery Disease, Hypertension, Type II Weeks Of Treatment: 2 History: Diabetes, Osteomyelitis, Neuropathy Clustered Wound: No Photos Wound Measurements Length: (cm) Width: (cm) Depth: (cm) Area: (cm) Volume: (cm) 0 % Reduction in Area: 100% 0 % Reduction in Volume: 100% 0 Epithelialization: Large (67-100%) 0 Tunneling: No 0 Undermining: No Wound Description Classification: Full Thickness Without Exposed Support Structures Wound Margin: Indistinct, nonvisible Exudate Amount: None Present Foul Odor After Cleansing: No Slough/Fibrino No Wound Bed Granulation Amount: None Present (0%) Exposed Structure Necrotic Amount: None Present (0%) Fascia Exposed: No Fat Layer (Subcutaneous Tissue) Exposed: No Tendon Exposed: No Muscle Exposed: No Joint Exposed: No Bone Exposed: No Assessment Notes scabbed Electronic Signature(s) Signed: 02/04/2021 5:03:19 PM By: Baruch Gouty RN, BSN Entered By: Baruch Gouty on 02/04/2021 11:21:12 -------------------------------------------------------------------------------- [place] Details Patient Name: Date of Service: [name], [name] 02/04/2021 10:30 A M Medical Record Number: [id number] Patient Account Number: [id number] Date of Birth/Sex: Treating RN: 10/04/1970 (50 y.o. [name] Primary Care Apryle Stowell: [name] Other Clinician: [name] Referring Jossette Zirbel: Treating Oiva Dibari/Extender: [name], [name] in Treatment: 21 Vital Signs Time Taken: 10:41 Temperature (F): 97.9 Height (in): 65 Pulse (bpm): 83 Weight (lbs): 196 Respiratory Rate (breaths/min): 14 Body Mass Index (BMI): 32.6 Blood Pressure (mmHg): 142/96 Capillary Blood Glucose (mg/dl): 145 Reference Range: 80 - 120 mg / dl Airway Pulse Oximetry (%): 100 Electronic Signature(s) Signed: 02/06/2021 10:31:33 AM By: [name] [name] , , Previous Signature: 02/04/2021 10:50:03 AM Version By: [name] [name] , , Entered By: [name] on 02/04/2021 10:58:49

## 2021-02-05 ENCOUNTER — Encounter (HOSPITAL_BASED_OUTPATIENT_CLINIC_OR_DEPARTMENT_OTHER): Payer: 59 | Admitting: Internal Medicine

## 2021-02-05 DIAGNOSIS — E11621 Type 2 diabetes mellitus with foot ulcer: Secondary | ICD-10-CM | POA: Diagnosis not present

## 2021-02-05 LAB — GLUCOSE, CAPILLARY
Glucose-Capillary: 249 mg/dL — ABNORMAL HIGH (ref 70–99)
Glucose-Capillary: 161 mg/dL — ABNORMAL HIGH (ref 70–99)

## 2021-02-05 NOTE — Progress Notes (Addendum)
ADMIRAL, MARCUCCI ([id number]) Visit Report for 02/04/2021 Chief Complaint Document Details Patient Name: Date of Service: [place] RD, [name] 02/04/2021 10:30 A M Medical Record Number: [id number] Patient Account Number: [id number] Date of Birth/Sex: Treating RN: 09/19/1970 (50 y.o. [name] Primary Care Provider: [name] Other Clinician: Referring Provider: Treating Provider/Extender: [name], [name] in Treatment: 21 Information Obtained from: Patient Chief Complaint 09/05/2020; patient is here for review of the wound extensively on a right TMA amputation site. He also has a wound on the plantar aspect of the left foot Electronic Signature(s) Signed: 02/04/2021 11:06:57 AM By: Worthy Keeler PA-C Entered By: Worthy Keeler on 02/04/2021 11:06:57 -------------------------------------------------------------------------------- Debridement Details Patient Name: Date of Service: [name], [place] [name] 02/04/2021 10:30 A M Medical Record Number: [id number] Patient Account Number: [id number] Date of Birth/Sex: Treating RN: 15-Dec-1970 (50 y.o. [name] Primary Care Provider: [name] Other Clinician: Referring Provider: Treating Provider/Extender: [name], [name] in Treatment: 21 Debridement Performed for Assessment: Wound #7 Right,Lateral,Plantar Foot Performed By: Physician Worthy Keeler, PA Debridement Type: Debridement Severity of Tissue Pre Debridement: Fat layer exposed Level of Consciousness (Pre-procedure): Awake and Alert Pre-procedure Verification/Time Out Yes - 11:25 Taken: Start Time: 11:27 Pain Control: Other : Benzocaine 20% spray T Area Debrided (L x W): otal 0.9 (cm) x 0.5 (cm) = 0.45 (cm) Tissue and other material debrided: Non-Viable, Callus, Subcutaneous, Skin: Epidermis Level: Skin/Subcutaneous Tissue Debridement Description: Excisional Instrument: Curette Bleeding: Minimum Hemostasis  Achieved: Silver Nitrate Procedural Pain: 0 Post Procedural Pain: 0 Response to Treatment: Procedure was tolerated well Level of Consciousness (Post- Awake and Alert procedure): Post Debridement Measurements of Total Wound Length: (cm) 0.9 Width: (cm) 0.5 Depth: (cm) 1 Volume: (cm) 0.353 Character of Wound/Ulcer Post Debridement: Improved Severity of Tissue Post Debridement: Fat layer exposed Post Procedure Diagnosis Same as Pre-procedure Electronic Signature(s) Signed: 02/04/2021 5:03:19 PM By: Baruch Gouty RN, BSN Signed: 02/12/2021 5:50:22 PM By: Worthy Keeler PA-C Entered By: Baruch Gouty on 02/04/2021 11:33:26 -------------------------------------------------------------------------------- HPI Details Patient Name: Date of Service: [name], [place] 02/04/2021 10:30 A M Medical Record Number: [id number] Patient Account Number: [id number] Date of Birth/Sex: Treating RN: 10-21-1970 (50 y.o. [name] Primary Care Provider: [name] Other Clinician: Referring Provider: Treating Provider/Extender: [name], [name] in Treatment: 21 History of Present Illness HPI Description: ADMISSION 09/05/2020 This is a 50 year old man who has type 2 diabetes. Essentially the problem began admitted to [facility] in December with infection gas gangrene. Cultures ultimately grew staph lugdunensis. 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 March Rummage. Shortly thereafter he developed a nonhealing wound with wound dehiscence noted on 05/09/2020. He had a wound VAC I think for a period of time after the surgery but it did not help. He has undergone an IandD and skin graft on the right foot on 05/16/2020. He underwent a further IandD on 06/04/2020 not debrided to bone. 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 Culture of the metatarsal grew  Enterobacter cloacae I. He has been followed by Dr. [name] of infectious disease. Currently on Levaquin and doxycycline I think at about the 4-week of 6 mark. At the last visit with Dr. March Rummage he was supposed to have use Santyl although I do not think he has it and he has been using I think a Betadine wet-to-dry.  He is referred here by Dr. [name] for our review who saw him yesterday. I cannot see that he has had imaging studies of the right foot. No MRI Past medical history includes type 2 diabetes, in 2021 and ulcer of the left foot that ultimately healed with a wound VAC, stage III chronic kidney disease, first toe amputations bilaterally, hypertension, still a 1/2 pack/day smoker. The patient had ABIs on 08/09/2018 which was 1.00 on the right and 0.96 on the left both areas had triphasic waveforms. Not felt to have an arterial issue. 09/12/2020; 1 week follow-up. He has been doing the dressings along with home health. This is a deep punched out area in the incision of his transmit site. We use silver alginate last week to help with drainage and antibacterial properties. He tolerated this well 6/24; the patient was actually at his podiatrist this morning who debrided the wound. We have been using silver alginate. They are going to follow him in 6 weeks. 6/29; patient is using silver alginate. Apparently saw Dr. March Rummage this week although I have not checked his note I will try to do so. He is following up in 6 weeks. This was the original transmetatarsal amputation when I first saw this 3 mid part of his amputation site was deep down to bone however this seems to have progressively close down which is gratifying to see. He still has a fairly callus uneven surface however spreading this apart its difficult to identify anything that does not look epithelialized. When I first saw this I thought he would require an extensive debridement perhaps back in the OR by Dr. March Rummage however all of this appears to be looking  better and at this point I would continue with the silver alginate see if this holds together over the next several weeks. He does not have an arterial issue 7/13; 2-week follow-up. Unfortunately the area did not hold together there was drainage on his dressing. Still copious amounts of callus. It was difficult to see where this actually was open therefore I elected to go ahead with a vigorous debridement. Still using silver alginate 7/20 I brought him back after extensive callus debridement last week. The only open area was on the medial part of the foot extensive debridement of thick surrounding callus and I was able to do identify the remaining tunneling area. 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 This is the area that we may have been following. This is smaller this week but still surrounded with thick callus He came in today with a large area of callus on the left midfoot. This I had noticed previously. HOWEVER our intake nurse clearly incorrectly documented this as opening. The callus and split there was an odor so all of this had to be removed. 10/29/2020 unfortunately upon evaluation today this patient appears to have significant mount of callus noted at this point. There does not appear to be any signs of active infection systemically which is great although locally he is having some discomfort around the lateral portion of his foot on the right. The left foot is no having a lot of callus I am not certain even has an open wound if there is anything is extremely tiny. 11/05/2020 upon evaluation today patient's wounds actually appear to be doing a bit better compared to last week since we have uncovered some of these areas I think there are some definite improvements. With that  being said there is still quite a bit of tunneling and undermining at many locations that again has me concerned I still think that the MRI is the best  thing to do he has that scheduled for Saturday. 11/26/2020 upon evaluation today patient appears to be doing decently well in regard to his foot in general. He did have an MRI I did review this and he had multiple findings on MRI consistent with osteomyelitis across the board in regard pretty much to his remaining metatarsal regions. Nonetheless there was also some evidence of some involvement of the navicular bone as well. Either way I think that this patient actually is in desperate need of get him in for hyperbaric oxygen therapy we discussed that today that will be detailed in the plan. Nonetheless he is also still on the doxycycline which I think is appropriate for the time being as well I did go ahead and place him on that following the results of the MRI based on what we were seeing. I gave him that prescription for 14 days for the time being and we will go from there. 12/03/2020 upon evaluation today patient appears to be doing decently well in regard to his wounds in general in regard to the right foot. With that being said he does have a callus on the left foot in the end there was actually an area open area here that will need to be addressed. This is small and I think we take care of it now hopefully will heal quite readily. Fortunately I do not see any signs of infection worsening I am getting need to refill the antibiotic for him today. He has been taking doxycycline which seems to be doing well. We will also get a go ahead and get him approved for HBO therapy. 12/31/2020 upon evaluation today patient appears to be doing really roughly the same as compared to last time I saw him. Fortunately there is no evidence of active infection which is great news systemically he has started hyperbarics he seems to be doing decently well he said a couple times where anxiety kind of got to him but overall I think he is doing awesome. He is no longer on the antibiotic therapy. With that being said I do  believe that he is going to need likely some additional antibiotics based on what I am seeing today we can actually obtain a bone culture from the plantar wound that was hiding underneath the callus. He was having pain in this area which is what may be try to clear away the callus to look sure enough there was an actual opening that went deeper into the wound bed region here. 01/21/2021 upon evaluation today patient appears to be doing about the same in regard to his foot in general. He does have some depth to some of the wound areas I really think he would benefit from is trying to see about getting him set up for a gauze VAC. I think that the gauze VAC could be helpful for him and will work much more effectively with what we are seeing as compared to traditional foam wound VAC. The patient is in agreement with giving this a trial. Nonetheless unfortunately think that he overall seems to be doing really well and I think we are headed in the right direction as far as the overall healing is concerned. 01/28/2021 upon evaluation today patient appears to be doing well with regard to his wound all things considered he does have  a lot of callus buildup especially on the plantar aspect of the left foot. There is no wound here but a significant amount of callus. That is getting need to be addressed today. Subsequently he also has a lot of callus on the right foot as well as some of the area around the wounds actually being significantly callused. Again I need to clear this away today. 02/04/2021 upon evaluation today patient actually seems to be doing quite well with regard to his foot ulcer. I do believe that the hyperbaric oxygen therapy has been beneficial for him thus far also think that he is doing well with the wound VAC. In combination I think he has an excellent plan at this time as far as treatment is concerned. I do not see any signs of evidence of infection which is great news as well. Electronic  Signature(s) Signed: 02/04/2021 1:46:00 PM By: Worthy Keeler PA-C Entered By: Worthy Keeler on 02/04/2021 13:46:00 -------------------------------------------------------------------------------- Physical Exam Details Patient Name: Date of Service: [name], [name] 02/04/2021 10:30 A M Medical Record Number: [id number] Patient Account Number: [id number] Date of Birth/Sex: Treating RN: 15-Dec-1970 (50 y.o. [name] Primary Care Provider: [name] Other Clinician: Referring Provider: Treating Provider/Extender: [name], [name] in Treatment: 21 Constitutional Well-nourished and well-hydrated in no acute distress. Respiratory normal breathing without difficulty. Psychiatric this patient is able to make decisions and demonstrates good insight into disease process. Alert and Oriented x 3. pleasant and cooperative. Notes Patient's wound bed showed signs of good granulation and epithelization at this point. Fortunately he is doing quite well I did perform some debridement around the lateral wound in order to flatten out some of the skin around the edges which I am hopeful will help [place] maintain a better seal with regard to the wound VAC. Patient is in agreement with that plan. Shows debridement this appears to be doing much better I did use some silver nitrate to cauterize the area and prevent it from continuing to bleed. Electronic Signature(s) Signed: 02/04/2021 1:46:19 PM By: Worthy Keeler PA-C Entered By: Worthy Keeler on 02/04/2021 13:46:18 -------------------------------------------------------------------------------- Physician Orders Details Patient Name: Date of Service: [name], [place] [name] 02/04/2021 10:30 A M Medical Record Number: [id number] Patient Account Number: [id number] Date of Birth/Sex: Treating RN: 06/16/1970 (50 y.o. [name] Primary Care Provider: [name] Other Clinician: Referring Provider: Treating  Provider/Extender: [name], [name] in Treatment: 21 Verbal / Phone Orders: No Diagnosis Coding ICD-10 Coding Code Description [id number] Other chronic osteomyelitis, right ankle and foot E11.621 Type 2 diabetes mellitus with foot ulcer L97.514 Non-pressure chronic ulcer of other part of right foot with necrosis of bone T81.31XS Disruption of external operation (surgical) wound, not elsewhere classified, sequela F17.218 Nicotine dependence, cigarettes, with other nicotine-induced disorders L97.522 Non-pressure chronic ulcer of other part of left foot with fat layer exposed L84 Corns and callosities Follow-up Appointments ppointment in 1 week. - with Margarita Grizzle after HBO Return A Nurse Visit: - Fri 11/11 and Mon 11/14 for [facility] change Bathing/ Shower/ Hygiene May shower with protection but do not get wound dressing(s) wet. Negative Presssure Wound Therapy Wound Vac to wound continuously at 130mm/hg pressure Sterile Gauze Packing Edema Control - Lymphedema / SCD / Other Elevate legs to the level of the heart or above for 30 minutes daily and/or when sitting, a frequency of: Avoid standing for long periods of time. Moisturize legs daily. - to foot with dressing  changes Off-Loading Open toe surgical shoe to: - Wear at all times except driving. on right foot Other: - may wear shoe with memory foam insert to left foot, cut hole where callous is and insert in shoe to help offload Additional Orders / Instructions Stop/Decrease Smoking Follow Nutritious Diet Non Wound Condition Other Non Wound Condition Orders/Instructions: - cushion bottom of left foot with orthopedic felt or foam [place] to [place] for wound care. May utilize formulary equivalent dressing for wound treatment orders unless otherwise specified. Dressing changes to be completed by [place] on Monday / Wednesday / Friday except when patient has scheduled visit at [facility]. - VAC changes  Mon and Fri Hyperbaric Oxygen Therapy Indication: - wagner grade 3 diabetic foot ulcer of right foot If appropriate for treatment, begin HBOT per protocol: 2.5 ATA for 90 Minutes with 2 Five (5) Minute A Breaks ir Total Number of Treatments: - 40 One treatments per day (delivered Monday through Friday unless otherwise specified in Special Instructions below): Finger stick Blood Glucose Pre- and Post- HBOT Treatment. Follow Hyperbaric Oxygen Glycemia Protocol A frin (Oxymetazoline HCL) 0.05% nasal spray - 1 spray in both nostrils daily as needed prior to HBO treatment for difficulty clearing ears Wound Treatment Wound #3 - Foot Wound Laterality: Right, Lateral, Proximal Cleanser: Soap and Water 3 x Per Week/30 Days Discharge Instructions: May shower and wash wound with dial antibacterial soap and water prior to dressing change. Cleanser: Wound Cleanser 3 x Per Week/30 Days Discharge Instructions: Cleanse the wound with wound cleanser prior to applying a clean dressing using gauze sponges, not tissue or cotton balls. Peri-Wound Care: Sween Lotion (Moisturizing lotion) 3 x Per Week/30 Days Discharge Instructions: Apply moisturizing lotion to foot with dressing changes Prim Dressing: VAC ary 3 x Per Week/30 Days Secured With: Coban Self-Adherent Wrap 4x5 (in/yd) (Generic) 3 x Per Week/30 Days Discharge Instructions: Secure with Coban as directed. Secured With: [employer], 4.5x3.1 (in/yd) (Generic) 3 x Per Week/30 Days Discharge Instructions: Secure with Kerlix as directed. Wound #7 - Foot Wound Laterality: Plantar, Right, Lateral Cleanser: Soap and Water 3 x Per Week/30 Days Discharge Instructions: May shower and wash wound with dial antibacterial soap and water prior to dressing change. Cleanser: Wound Cleanser 3 x Per Week/30 Days Discharge Instructions: Cleanse the wound with wound cleanser prior to applying a clean dressing using gauze sponges, not tissue or cotton  balls. Peri-Wound Care: Sween Lotion (Moisturizing lotion) 3 x Per Week/30 Days Discharge Instructions: Apply moisturizing lotion to foot with dressing changes Prim Dressing: VAC ary 3 x Per Week/30 Days Secured With: Coban Self-Adherent Wrap 4x5 (in/yd) (Generic) 3 x Per Week/30 Days Discharge Instructions: Secure with Coban as directed. Secured With: [employer], 4.5x3.1 (in/yd) (Generic) 3 x Per Week/30 Days Discharge Instructions: Secure with Kerlix as directed. GLYCEMIA INTERVENTIONS PROTOCOL PRE-HBO GLYCEMIA INTERVENTIONS ACTION INTERVENTION Obtain pre-HBO capillary blood glucose (ensure 1 physician order is in chart). A. Notify HBO physician and await physician orders. 2 If result is 70 mg/dl or below: B. If the result meets the hospital definition of a critical result, follow hospital policy. A. Give patient an 8 ounce Glucerna Shake, an 8 ounce Ensure, or 8 ounces of a Glucerna/Ensure equivalent dietary supplement*. B. Wait 30 minutes. If result is 71 mg/dl to 130 mg/dl: C. Retest patients capillary blood glucose (CBG). D. If result greater than or equal to 110 mg/dl, proceed with HBO. If result less than 110 mg/dl, notify HBO  physician and consider holding HBO. If result is 131 mg/dl to 249 mg/dl: A. Proceed with HBO. A. Notify HBO physician and await physician orders. B. It is recommended to hold HBO and do If result is 250 mg/dl or greater: blood/urine ketone testing. C. If the result meets the hospital definition of a critical result, follow hospital policy. POST-HBO GLYCEMIA INTERVENTIONS ACTION INTERVENTION Obtain post HBO capillary blood glucose (ensure 1 physician order is in chart). A. Notify HBO physician and await physician orders. 2 If result is 70 mg/dl or below: B. If the result meets the hospital definition of a critical result, follow hospital policy. A. Give patient an 8 ounce Glucerna Shake, an 8 ounce Ensure, or 8 ounces of  a Glucerna/Ensure equivalent dietary supplement*. B. Wait 15 minutes for symptoms of If result is 71 mg/dl to 100 mg/dl: hypoglycemia (i.e. nervousness, anxiety, sweating, chills, clamminess, irritability, confusion, tachycardia or dizziness). C. If patient asymptomatic, discharge patient. If patient symptomatic, repeat capillary blood glucose (CBG) and notify HBO physician. If result is 101 mg/dl to 249 mg/dl: A. Discharge patient. A. Notify HBO physician and await physician orders. B. It is recommended to do blood/urine ketone If result is 250 mg/dl or greater: testing. C. If the result meets the hospital definition of a critical result, follow hospital policy. *Juice or candies are NOT equivalent products. If patient refuses the Glucerna or Ensure, please consult the hospital dietitian for an appropriate substitute. Electronic Signature(s) Signed: 02/04/2021 5:03:19 PM By: Baruch Gouty RN, BSN Signed: 02/12/2021 5:50:22 PM By: Worthy Keeler PA-C Entered By: Baruch Gouty on 02/04/2021 16:48:12 -------------------------------------------------------------------------------- Problem List Details Patient Name: Date of Service: [name], [place] [name] 02/04/2021 10:30 A M Medical Record Number: [id number] Patient Account Number: [id number] Date of Birth/Sex: Treating RN: May 01, 1970 (50 y.o. [name] Primary Care Provider: Minette Brine, [place] Other Clinician: Referring Provider: Treating Provider/Extender: [name], [name] in Treatment: 21 Active Problems ICD-10 Encounter Code Description Active Date MDM Diagnosis [phone] Other chronic osteomyelitis, right ankle and foot 09/05/2020 No Yes E11.621 Type 2 diabetes mellitus with foot ulcer 09/05/2020 No Yes L97.514 Non-pressure chronic ulcer of other part of right foot with necrosis of bone 09/05/2020 No Yes T81.31XS Disruption of external operation (surgical) wound, not elsewhere classified, 09/05/2020 No  Yes sequela F17.218 Nicotine dependence, cigarettes, with other nicotine-induced disorders 11/26/2020 No Yes L97.522 Non-pressure chronic ulcer of other part of left foot with fat layer exposed 10/22/2020 No Yes L84 Corns and callosities 01/28/2021 No Yes Inactive Problems Resolved Problems Electronic Signature(s) Signed: 02/04/2021 11:06:33 AM By: Worthy Keeler PA-C Entered By: Worthy Keeler on 02/04/2021 11:06:33 -------------------------------------------------------------------------------- Progress Note Details Patient Name: Date of Service: [name], [place] [name] 02/04/2021 10:30 A M Medical Record Number: [id number] Patient Account Number: [id number] Date of Birth/Sex: Treating RN: Jun 15, 1970 (50 y.o. [name] Primary Care Provider: [name] Other Clinician: Referring Provider: Treating Provider/Extender: [name], [name] in Treatment: 21 Subjective Chief Complaint Information obtained from Patient 09/05/2020; patient is here for review of the wound extensively on a right TMA amputation site. He also has a wound on the plantar aspect of the left foot History of Present Illness (HPI) ADMISSION 09/05/2020 This is a 50 year old man who has type 2 diabetes. Essentially the problem began admitted to [facility] health in December with infection gas gangrene. Cultures ultimately grew staph lugdunensis. 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 March Rummage. Shortly thereafter he developed  a nonhealing wound with wound dehiscence noted on 05/09/2020. He had a wound VAC I think for a period of time after the surgery but it did not help. He has undergone an IandD and skin graft on the right foot on 05/16/2020. He underwent a further IandD on 06/04/2020 not debrided to bone. 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 Culture of the metatarsal grew  Enterobacter cloacae I. He has been followed by Dr. [name] of infectious disease. Currently on Levaquin and doxycycline I think at about the 4-week of 6 mark. At the last visit with Dr. March Rummage he was supposed to have use Santyl although I do not think he has it and he has been using I think a Betadine wet-to-dry. He is referred here by Dr. [name] for our review who saw him yesterday. I cannot see that he has had imaging studies of the right foot. No MRI Past medical history includes type 2 diabetes, in 2021 and ulcer of the left foot that ultimately healed with a wound VAC, stage III chronic kidney disease, first toe amputations bilaterally, hypertension, still a 1/2 pack/day smoker. The patient had ABIs on 08/09/2018 which was 1.00 on the right and 0.96 on the left both areas had triphasic waveforms. Not felt to have an arterial issue. 09/12/2020; 1 week follow-up. He has been doing the dressings along with home health. This is a deep punched out area in the incision of his transmit site. We use silver alginate last week to help with drainage and antibacterial properties. He tolerated this well 6/24; the patient was actually at his podiatrist this morning who debrided the wound. We have been using silver alginate. They are going to follow him in 6 weeks. 6/29; patient is using silver alginate. Apparently saw Dr. March Rummage this week although I have not checked his note I will try to do so. He is following up in 6 weeks. This was the original transmetatarsal amputation when I first saw this 3 mid part of his amputation site was deep down to bone however this seems to have progressively close down which is gratifying to see. He still has a fairly callus uneven surface however spreading this apart its difficult to identify anything that does not look epithelialized. When I first saw this I thought he would require an extensive debridement perhaps back in the OR by Dr. March Rummage however all of this appears to be looking  better and at this point I would continue with the silver alginate see if this holds together over the next several weeks. He does not have an arterial issue 7/13; 2-week follow-up. Unfortunately the area did not hold together there was drainage on his dressing. Still copious amounts of callus. It was difficult to see where this actually was open therefore I elected to go ahead with a vigorous debridement. Still using silver alginate 7/20 I brought him back after extensive callus debridement last week. The only open area was on the medial part of the foot extensive debridement of thick surrounding callus and I was able to do identify the remaining tunneling area. 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 This is the area that we may have been following. This is smaller this week but still surrounded with thick callus He came in today with a large area of callus on the left midfoot. This I had noticed  previously. HOWEVER our intake nurse clearly incorrectly documented this as opening. The callus and split there was an odor so all of this had to be removed. 10/29/2020 unfortunately upon evaluation today this patient appears to have significant mount of callus noted at this point. There does not appear to be any signs of active infection systemically which is great although locally he is having some discomfort around the lateral portion of his foot on the right. The left foot is no having a lot of callus I am not certain even has an open wound if there is anything is extremely tiny. 11/05/2020 upon evaluation today patient's wounds actually appear to be doing a bit better compared to last week since we have uncovered some of these areas I think there are some definite improvements. With that being said there is still quite a bit of tunneling and undermining at many locations that again has me concerned I still think that the MRI is the best  thing to do he has that scheduled for Saturday. 11/26/2020 upon evaluation today patient appears to be doing decently well in regard to his foot in general. He did have an MRI I did review this and he had multiple findings on MRI consistent with osteomyelitis across the board in regard pretty much to his remaining metatarsal regions. Nonetheless there was also some evidence of some involvement of the navicular bone as well. Either way I think that this patient actually is in desperate need of get him in for hyperbaric oxygen therapy we discussed that today that will be detailed in the plan. Nonetheless he is also still on the doxycycline which I think is appropriate for the time being as well I did go ahead and place him on that following the results of the MRI based on what we were seeing. I gave him that prescription for 14 days for the time being and we will go from there. 12/03/2020 upon evaluation today patient appears to be doing decently well in regard to his wounds in general in regard to the right foot. With that being said he does have a callus on the left foot in the end there was actually an area open area here that will need to be addressed. This is small and I think we take care of it now hopefully will heal quite readily. Fortunately I do not see any signs of infection worsening I am getting need to refill the antibiotic for him today. He has been taking doxycycline which seems to be doing well. We will also get a go ahead and get him approved for HBO therapy. 12/31/2020 upon evaluation today patient appears to be doing really roughly the same as compared to last time I saw him. Fortunately there is no evidence of active infection which is great news systemically he has started hyperbarics he seems to be doing decently well he said a couple times where anxiety kind of got to him but overall I think he is doing awesome. He is no longer on the antibiotic therapy. With that being said I do  believe that he is going to need likely some additional antibiotics based on what I am seeing today we can actually obtain a bone culture from the plantar wound that was hiding underneath the callus. He was having pain in this area which is what may be try to clear away the callus to look sure enough there was an actual opening that went deeper into the wound bed region here. 01/21/2021 upon  evaluation today patient appears to be doing about the same in regard to his foot in general. He does have some depth to some of the wound areas I really think he would benefit from is trying to see about getting him set up for a gauze VAC. I think that the gauze VAC could be helpful for him and will work much more effectively with what we are seeing as compared to traditional foam wound VAC. The patient is in agreement with giving this a trial. Nonetheless unfortunately think that he overall seems to be doing really well and I think we are headed in the right direction as far as the overall healing is concerned. 01/28/2021 upon evaluation today patient appears to be doing well with regard to his wound all things considered he does have a lot of callus buildup especially on the plantar aspect of the left foot. There is no wound here but a significant amount of callus. That is getting need to be addressed today. Subsequently he also has a lot of callus on the right foot as well as some of the area around the wounds actually being significantly callused. Again I need to clear this away today. 02/04/2021 upon evaluation today patient actually seems to be doing quite well with regard to his foot ulcer. I do believe that the hyperbaric oxygen therapy has been beneficial for him thus far also think that he is doing well with the wound VAC. In combination I think he has an excellent plan at this time as far as treatment is concerned. I do not see any signs of evidence of infection which is great news as  well. Objective Constitutional Well-nourished and well-hydrated in no acute distress. Vitals Time Taken: 10:41 AM, Height: 65 in, Weight: 196 lbs, BMI: 32.6, Temperature: 97.9 F, Pulse: 83 bpm, Respiratory Rate: 14 breaths/min, Blood Pressure: 142/96 mmHg, Capillary Blood Glucose: 145 mg/dl, Pulse Oximetry: 100 %. Respiratory normal breathing without difficulty. Psychiatric this patient is able to make decisions and demonstrates good insight into disease process. Alert and Oriented x 3. pleasant and cooperative. General Notes: Patient's wound bed showed signs of good granulation and epithelization at this point. Fortunately he is doing quite well I did perform some debridement around the lateral wound in order to flatten out some of the skin around the edges which I am hopeful will help [place] maintain a better seal with regard to the wound VAC. Patient is in agreement with that plan. Shows debridement this appears to be doing much better I did use some silver nitrate to cauterize the area and prevent it from continuing to bleed. Integumentary (Hair, Skin) Wound #3 status is Open. Original cause of wound was Gradually Appeared. The date acquired was: 10/29/2020. The wound has been in treatment 14 weeks. The wound is located on the Right,Proximal,Lateral Foot. The wound measures 0.5cm length x 0.6cm width x 2.1cm depth; 0.236cm^2 area and 0.495cm^3 volume. There is Fat Layer (Subcutaneous Tissue) exposed. There is undermining starting at 12:00 and ending at 12:00 with a maximum distance of 1.7cm. There is a medium amount of sanguinous drainage noted. The wound margin is well defined and not attached to the wound base. There is large (67-100%) red granulation within the wound bed. There is no necrotic tissue within the wound bed. Wound #7 status is Open. Original cause of wound was Gradually Appeared. The date acquired was: 12/31/2020. The wound has been in treatment 5 weeks. The wound is located on  the [place]. The  wound measures 0.9cm length x 0.5cm width x 1cm depth; 0.353cm^2 area and 0.353cm^3 volume. There is Fat Layer (Subcutaneous Tissue) exposed. There is no tunneling noted, however, there is undermining starting at 12:00 and ending at 1:00 with a maximum distance of 1.4cm. There is a medium amount of sanguinous drainage noted. The wound margin is well defined and not attached to the wound base. There is large (67-100%) red granulation within the wound bed. There is no necrotic tissue within the wound bed. Wound #8 status is Open. Original cause of wound was Trauma. The date acquired was: 01/16/2021. The wound has been in treatment 2 weeks. The wound is located on the Right,Anterior Lower Leg. The wound measures 0cm length x 0cm width x 0cm depth; 0cm^2 area and 0cm^3 volume. There is no tunneling or undermining noted. There is a none present amount of drainage noted. The wound margin is indistinct and nonvisible. There is no granulation within the wound bed. There is no necrotic tissue within the wound bed. General Notes: scabbed Assessment Active Problems ICD-10 Other chronic osteomyelitis, right ankle and foot Type 2 diabetes mellitus with foot ulcer Non-pressure chronic ulcer of other part of right foot with necrosis of bone Disruption of external operation (surgical) wound, not elsewhere classified, sequela Nicotine dependence, cigarettes, with other nicotine-induced disorders Non-pressure chronic ulcer of other part of left foot with fat layer exposed Corns and callosities Procedures Wound #7 Pre-procedure diagnosis of Wound #7 is a Diabetic Wound/Ulcer of the Lower Extremity located on the Right,Lateral,Plantar Foot .Severity of Tissue Pre Debridement is: Fat layer exposed. There was a Excisional Skin/Subcutaneous Tissue Debridement with a total area of 0.45 sq cm performed by Worthy Keeler, PA. With the following instrument(s): Curette to remove  Non-Viable tissue/material. Material removed includes Callus, Subcutaneous Tissue, and Skin: Epidermis after achieving pain control using Other (Benzocaine 20% spray). No specimens were taken. A time out was conducted at 11:25, prior to the start of the procedure. A Minimum amount of bleeding was controlled with Silver Nitrate. The procedure was tolerated well with a pain level of 0 throughout and a pain level of 0 following the procedure. Post Debridement Measurements: 0.9cm length x 0.5cm width x 1cm depth; 0.353cm^3 volume. Character of Wound/Ulcer Post Debridement is improved. Severity of Tissue Post Debridement is: Fat layer exposed. Post procedure Diagnosis Wound #7: Same as Pre-Procedure Plan Follow-up Appointments: Return Appointment in 1 week. - with Margarita Grizzle after HBO Nurse Visit: - Fri 11/11 and Mon 11/14 for [facility] change Bathing/ Shower/ Hygiene: May shower with protection but do not get wound dressing(s) wet. Negative Presssure Wound Therapy: Wound Vac to wound continuously at 171mm/hg pressure Sterile Gauze Packing Edema Control - Lymphedema / SCD / Other: Elevate legs to the level of the heart or above for 30 minutes daily and/or when sitting, a frequency of: Avoid standing for long periods of time. Moisturize legs daily. - to foot with dressing changes Off-Loading: Open toe surgical shoe to: - Wear at all times except driving. on right foot Other: - may wear shoe with memory foam insert to left foot, cut hole where callous is and insert in shoe to help offload Additional Orders / Instructions: Stop/Decrease Smoking Follow Nutritious Diet Non Wound Condition: Other Non Wound Condition Orders/Instructions: - cushion bottom of left foot with orthopedic felt or foam Home Health: Admit to [place] for wound care. May utilize formulary equivalent dressing for wound treatment orders unless otherwise specified. Dressing changes to be completed by Home Health  on Monday / Wednesday /  Friday except when patient has scheduled visit at [facility]. - VAC changes Mon and Fri Hyperbaric Oxygen Therapy: Indication: - wagner grade 3 diabetic foot ulcer of right foot If appropriate for treatment, begin HBOT per protocol: 2.5 ATA for 90 Minutes with 2 Five (5) Minute Air Breaks T Number of Treatments: - 40 otal One treatments per day (delivered Monday through Friday unless otherwise specified in Special Instructions below): Finger stick Blood Glucose Pre- and Post- HBOT Treatment. Follow Hyperbaric Oxygen Glycemia Protocol Afrin (Oxymetazoline HCL) 0.05% nasal spray - 1 spray in both nostrils daily as needed prior to HBO treatment for difficulty clearing ears WOUND #3: - Foot Wound Laterality: Right, Lateral, Proximal Cleanser: Soap and Water 3 x Per Week/30 Days Discharge Instructions: May shower and wash wound with dial antibacterial soap and water prior to dressing change. Cleanser: Wound Cleanser 3 x Per Week/30 Days Discharge Instructions: Cleanse the wound with wound cleanser prior to applying a clean dressing using gauze sponges, not tissue or cotton balls. Peri-Wound Care: Sween Lotion (Moisturizing lotion) 3 x Per Week/30 Days Discharge Instructions: Apply moisturizing lotion to foot with dressing changes Prim Dressing: VAC 3 x Per Week/30 Days ary Secured With: Coban Self-Adherent Wrap 4x5 (in/yd) (Generic) 3 x Per Week/30 Days Discharge Instructions: Secure with Coban as directed. Secured With: [employer], 4.5x3.1 (in/yd) (Generic) 3 x Per Week/30 Days Discharge Instructions: Secure with Kerlix as directed. WOUND #7: - Foot Wound Laterality: Plantar, Right, Lateral Cleanser: Soap and Water 3 x Per Week/30 Days Discharge Instructions: May shower and wash wound with dial antibacterial soap and water prior to dressing change. Cleanser: Wound Cleanser 3 x Per Week/30 Days Discharge Instructions: Cleanse the wound with wound cleanser prior to applying a  clean dressing using gauze sponges, not tissue or cotton balls. Peri-Wound Care: Sween Lotion (Moisturizing lotion) 3 x Per Week/30 Days Discharge Instructions: Apply moisturizing lotion to foot with dressing changes Prim Dressing: VAC 3 x Per Week/30 Days ary Secured With: Coban Self-Adherent Wrap 4x5 (in/yd) (Generic) 3 x Per Week/30 Days Discharge Instructions: Secure with Coban as directed. Secured With: [employer], 4.5x3.1 (in/yd) (Generic) 3 x Per Week/30 Days Discharge Instructions: Secure with Kerlix as directed. 1. Would recommend currently that we going continue with the wound care measures as before and the patient is in agreement the plan. This includes the use of the wound VAC which I think is doing a great job. 2. I am also can recommend that we have the patient continue with the offloading which I think is doing a good job as well as the hyperbarics which I think is also tolerating quite well. We will see patient back for reevaluation in 1 week here in the clinic. If anything worsens or changes patient will contact our office for additional recommendations. Electronic Signature(s) Signed: 02/04/2021 5:03:19 PM By: Baruch Gouty RN, BSN Signed: 02/12/2021 5:50:22 PM By: Worthy Keeler PA-C Previous Signature: 02/04/2021 1:46:45 PM Version By: Worthy Keeler PA-C Entered By: Baruch Gouty on 02/04/2021 16:48:43 -------------------------------------------------------------------------------- SuperBill Details Patient Name: Date of Service: [name], [name] 02/04/2021 Medical Record Number: [id number] Patient Account Number: [id number] Date of Birth/Sex: Treating RN: January 14, 1971 (50 y.o. [name] Primary Care Provider: [name] Other Clinician: Referring Provider: Treating Provider/Extender: [name], [name] in Treatment: 21 Diagnosis Coding ICD-10 Codes Code Description [phone] Other chronic osteomyelitis, right ankle and  foot E11.621 Type 2 diabetes mellitus  with foot ulcer L97.514 Non-pressure chronic ulcer of other part of right foot with necrosis of bone T81.31XS Disruption of external operation (surgical) wound, not elsewhere classified, sequela F17.218 Nicotine dependence, cigarettes, with other nicotine-induced disorders L97.522 Non-pressure chronic ulcer of other part of left foot with fat layer exposed L84 Corns and callosities Facility Procedures CPT4 Code: [id number] Description: 58850 - DEB SUBQ TISSUE 20 SQ CM/< ICD-10 Diagnosis Description L97.522 Non-pressure chronic ulcer of other part of left foot with fat layer exposed Modifier: Quantity: 1 CPT4 Code: 27741287 Description: 86767 - WOUND VAC-50 SQ CM OR LESS Modifier: 59 Quantity: 1 Physician Procedures : CPT4 Code Description Modifier [id number] 99214 - WC PHYS LEVEL 4 - EST PT 25 ICD-10 Diagnosis Description M86.671 Other chronic osteomyelitis, right ankle and foot E11.621 Type 2 diabetes mellitus with foot ulcer L97.514 Non-pressure chronic ulcer of  other part of right foot with necrosis of bone T81.31XS Disruption of external operation (surgical) wound, not elsewhere classified, sequela Quantity: 1 : [id number] 11042 - WC PHYS SUBQ TISS 20 SQ CM ICD-10 Diagnosis Description L97.522 Non-pressure chronic ulcer of other part of left foot with fat layer exposed Quantity: 1 Electronic Signature(s) Signed: 02/04/2021 1:48:23 PM By: Worthy Keeler PA-C Entered By: Worthy Keeler on 02/04/2021 13:48:21

## 2021-02-05 NOTE — Progress Notes (Signed)
FARHAN, JEAN ([id number]) Visit Report for 02/05/2021 SuperBill Details Patient Name: Date of Service: [place] RD, [name] 02/05/2021 Medical Record Number: [id number] Patient Account Number: [id number] Date of Birth/Sex: Treating RN: May 03, 1970 (50 y.o. [name], [name] Primary Care Provider: [name] Other Clinician: [name] Referring Provider: Treating Provider/Extender: [name], [name] in Treatment: 21 Diagnosis Coding ICD-10 Codes Code Description [phone] Other chronic osteomyelitis, right ankle and foot E11.621 Type 2 diabetes mellitus with foot ulcer L97.514 Non-pressure chronic ulcer of other part of right foot with necrosis of bone T81.31XS Disruption of external operation (surgical) wound, not elsewhere classified, sequela F17.218 Nicotine dependence, cigarettes, with other nicotine-induced disorders L97.522 Non-pressure chronic ulcer of other part of left foot with fat layer exposed L84 Corns and callosities Facility Procedures CPT4 Code Description Modifier Quantity [id number] G0277-(Facility Use Only) HBOT full body chamber, 70min , 4 ICD-10 Diagnosis Description M86.671 Other chronic osteomyelitis, right ankle and foot E11.621 Type 2 diabetes mellitus with foot ulcer L97.514 Non-pressure chronic ulcer of other part of right foot with necrosis of bone Physician Procedures Quantity CPT4 Code Description Modifier [id number] 69678 - WC PHYS HYPERBARIC OXYGEN THERAPY 1 ICD-10 Diagnosis Description M86.671 Other chronic osteomyelitis, right ankle and foot E11.621 Type 2 diabetes mellitus with foot ulcer L97.514 Non-pressure chronic ulcer of other part of right foot with necrosis of bone Electronic Signature(s) Signed: 02/05/2021 12:47:51 PM By: [name] [name] , , Signed: 02/05/2021 5:10:51 PM By: [name] MD Entered By: [name] on 02/05/2021 12:47:50

## 2021-02-05 NOTE — Progress Notes (Addendum)
[name], [name] ([id number]) Visit Report for 02/05/2021 HBO Details Patient Name: Date of Service: [address] [place] [address], [name] 02/05/2021 8:00 A M Medical Record Number: [id number] Patient Account Number: [id number] Date of Birth/Sex: Treating RN: 1970-09-02 (50 y.o. [name], [name] Primary Care Dionicia Cerritos: [name] Other Clinician: Donavan [name] Referring Tanor Glaspy: Treating Murlean Seelye/Extender: [name], [name] in Treatment: 21 HBO Treatment Course Details Treatment Course Number: 1 Ordering Cid Agena: [name] Treatments Ordered: otal 40 HBO Treatment Start Date: 12/08/2020 HBO Indication: Diabetic Ulcer(s) of the Lower Extremity HBO Treatment Details Treatment Number: 30 Patient Type: Outpatient Chamber Type: Monoplace Chamber Serial #: [id number] Treatment Protocol: 2.5 ATA with 90 minutes oxygen, with two 5 minute air breaks Treatment Details Compression Rate Down: 2.0 psi / minute De-Compression Rate Up: 2.0 psi / minute A breaks and breathing ir Compress Tx Pressure periods Decompress Decompress Begins Reached (leave unused spaces Begins Ends blank) Chamber Pressure (ATA 1 2.5 2.5 2.5 2.5 2.5 - - 2.5 1 ) Clock Time (24 hr) 08:09 08:20 08:50 08:55 09:25 09:30 - - 10:00 10:11 Treatment Length: 122 (minutes) Treatment Segments: 4 Vital Signs Capillary Blood Glucose Reference Range: 80 - 120 mg / dl HBO Diabetic Blood Glucose Intervention Range: <131 mg/dl or >249 mg/dl Time Vitals Blood Respiratory Capillary Blood Glucose Pulse Action Type: Pulse: Temperature: Taken: Pressure: Rate: Glucose (mg/dl): Meter #: Oximetry (%) Taken: Pre 08:52 150/95 101 20 97.9 249 Post 10:14 149/100 96 20 97.8 161 Treatment Response Treatment Toleration: Well Treatment Completion Status: Treatment Completed without Adverse Event Additional Procedure Documentation Tissue Sevierity: Fat layer exposed Zakariyya [name] Notes No concerns with treatment  given Physician HBO Attestation: I certify that I supervised this HBO treatment in accordance with Medicare guidelines. A trained emergency response team is readily available per Yes hospital policies and procedures. Continue HBOT as ordered. Yes Electronic Signature(s) Signed: 02/05/2021 5:10:51 PM By: [name] MD Previous Signature: 02/05/2021 12:47:20 PM Version By: [name] [name] , , Entered By: [name] on 02/05/2021 17:06:03 -------------------------------------------------------------------------------- HBO Safety Checklist Details Patient Name: Date of Service: [name] RD, [place] [name] 02/05/2021 8:00 A M Medical Record Number: [id number] Patient Account Number: [id number] Date of Birth/Sex: Treating RN: 12-29-1970 (50 y.o. [name], [name] Primary Care Lareina Espino: [name] Other Clinician: Donavan [name] Referring Brooklynn Brandenburg: Treating Francie Keeling/Extender: [name], [name] in Treatment: 21 HBO Safety Checklist Items Safety Checklist Consent Form Signed Patient voided / foley secured and emptied When did you last eato 0630 Last dose of injectable or oral agent 0630 Ostomy pouch emptied and vented if applicable NA All implantable devices assessed, documented and approved NA Intravenous access site secured and place NA Valuables secured Linens and cotton and cotton/polyester blend (less than 51% polyester) Personal oil-based products / skin lotions / body lotions removed Wigs or hairpieces removed NA Smoking or tobacco materials removed NA Books / newspapers / magazines / loose paper removed Cologne, aftershave, perfume and deodorant removed Jewelry removed (may wrap wedding band) Make-up removed NA Hair care products removed Battery operated devices (external) removed Heating patches and chemical warmers removed Titanium eyewear removed NA Nail polish cured greater than 10 hours NA Casting material cured greater  than 10 hours NA Hearing aids removed NA Loose dentures or partials removed NA Prosthetics have been removed NA Patient demonstrates correct use of air break device (if applicable) Patient concerns have been addressed Patient grounding bracelet on and cord attached to chamber Specifics for Inpatients (complete in addition  to above) Medication sheet sent with patient NA Intravenous medications needed or due during therapy sent with patient NA Drainage tubes (e.g. nasogastric tube or chest tube secured and vented) NA Endotracheal or Tracheotomy tube secured NA Cuff deflated of air and inflated with saline NA Airway suctioned NA Notes Paper version used prior to treatment. Electronic Signature(s) Signed: 02/05/2021 2:20:01 PM By: [name] [name] , , Previous Signature: 02/05/2021 8:20:57 AM Version By: [name] [name] , , Entered By: [name] on 02/05/2021 14:20:01

## 2021-02-05 NOTE — Progress Notes (Addendum)
TEREN, ZURCHER ([id number]) Visit Report for 02/05/2021 Arrival Information Details Patient Name: Date of Service: [name], [name] 02/05/2021 8:00 A M Medical Record Number: [id number] Patient Account Number: [id number] Date of Birth/Sex: Treating RN: 01-13-1971 (50 y.o. [name], Meta.Reding Primary Care Domitila Stetler: [name] Other Clinician: [name] Referring [name]: Treating Latrenda Irani/Extender: [name], [name] in Treatment: 21 Visit Information History Since Last Visit All ordered tests and consults were completed: Yes Patient Arrived: [name] Added or deleted any medications: No Arrival Time: 07:34 Any new allergies or adverse reactions: No Accompanied By: self Had a fall or experienced change in No Transfer Assistance: None activities of daily living that may affect Patient Identification Verified: Yes risk of falls: Secondary Verification Process Completed: Yes Signs or symptoms of abuse/neglect since last visito No Patient Requires Transmission-Based Precautions: No Hospitalized since last visit: No Patient Has Alerts: No Implantable device outside of the clinic excluding No cellular tissue based products placed in the center since last visit: Pain Present Now: No Electronic Signature(s) Signed: 02/05/2021 8:15:50 AM By: [name] [name] , , Entered By: [name] on 02/05/2021 08:15:50 -------------------------------------------------------------------------------- Encounter Discharge Information Details Patient Name: Date of Service: [name], [name]. 02/05/2021 8:00 A M Medical Record Number: [id number] Patient Account Number: [id number] Date of Birth/Sex: Treating RN: [birth year] (50 y.o. [name] Primary Care Eaden Hettinger: [name] Other Clinician: [name] Referring [name]: Treating Voncile Schwarz/Extender: [name], [name] in Treatment: 21 Encounter Discharge  Information Items Discharge Condition: Stable Ambulatory Status: Cane Discharge Destination: Home Transportation: Private Auto Accompanied By: self Schedule Follow-up Appointment: No Clinical Summary of Care: Electronic Signature(s) Signed: 02/05/2021 12:49:19 PM By: [name] [name] , , Entered By: [name] on 02/05/2021 12:49:18 -------------------------------------------------------------------------------- [place] Details Patient Name: Date of Service: [name], [name] 02/05/2021 8:00 A M Medical Record Number: [id number] Patient Account Number: [id number] Date of Birth/Sex: Treating RN: May 08, 1970 (50 y.o. [name], [name] Primary Care Evalise Abruzzese: [name] Other Clinician: [name] Referring Lourdes Kucharski: Treating Kendallyn Lippold/Extender: [name], [name] in Treatment: 21 Vital Signs Time Taken: 08:52 Temperature (F): 97.9 Height (in): 65 Pulse (bpm): 101 Weight (lbs): 196 Respiratory Rate (breaths/min): 20 Body Mass Index (BMI): 32.6 Blood Pressure (mmHg): 150/95 Capillary Blood Glucose (mg/dl): 249 Reference Range: 80 - 120 mg / dl Electronic Signature(s) Signed: 02/05/2021 8:19:29 AM By: [name] [name] , , Entered By: [name] on 02/05/2021 08:19:29

## 2021-02-05 NOTE — Progress Notes (Addendum)
GOKU, HARB ([id number]) Visit Report for 02/04/2021 Arrival Information Details Patient Name: Date of Service: [place] [address], [name] 02/04/2021 8:00 A M Medical Record Number: [id number] Patient Account Number: [id number] Date of Birth/Sex: Treating RN: 09-06-1970 (50 y.o. [name], [name] Primary Care Julissa Browning: [name] Other Clinician: [name] Referring Tiara Maultsby: Treating Ellarose Brandi/Extender: [name], [name] in Treatment: 21 Visit Information History Since Last Visit All ordered tests and consults were completed: Yes Patient Arrived: [name] Added or deleted any medications: No Arrival Time: 07:36 Any new allergies or adverse reactions: No Accompanied By: self Had a fall or experienced change in No Transfer Assistance: None activities of daily living that may affect Patient Identification Verified: Yes risk of falls: Secondary Verification Process Completed: Yes Signs or symptoms of abuse/neglect since last visito No Patient Requires Transmission-Based Precautions: No Hospitalized since last visit: No Patient Has Alerts: No Implantable device outside of the clinic excluding No cellular tissue based products placed in the center since last visit: Pain Present Now: No Electronic Signature(s) Signed: 02/04/2021 9:58:36 AM By: [name] [name] , , Entered By: [name] on 02/04/2021 09:58:35 -------------------------------------------------------------------------------- Encounter Discharge Information Details Patient Name: Date of Service: [name], [place]. 02/04/2021 8:00 A M Medical Record Number: [id number] Patient Account Number: [id number] Date of Birth/Sex: Treating RN: [birth year] (50 y.o. [name] Primary Care Cayleigh Paull: [name] Other Clinician: [name] Referring Maryl Blalock: Treating Rasul Decola/Extender: [name], [name] in Treatment: 21 Encounter Discharge  Information Items Discharge Condition: Stable Ambulatory Status: Cane Discharge Destination: Other (Note Required) Transportation: Other Accompanied By: self Schedule Follow-up Appointment: No Clinical Summary of Care: Electronic Signature(s) Signed: 02/04/2021 11:06:19 AM By: [name] [name] , , Entered By: [name] on 02/04/2021 11:06:19 -------------------------------------------------------------------------------- Vitals Details Patient Name: Date of Service: [name], [name] 02/04/2021 8:00 A M Medical Record Number: [id number] Patient Account Number: [id number] Date of Birth/Sex: Treating RN: Feb 27, 1971 (50 y.o. [name] Primary Care Liz Pinho: [name] Other Clinician: [name] Referring Giovonnie Trettel: Treating Braniyah Besse/Extender: [name], [name] in Treatment: 21 Vital Signs Time Taken: 08:22 Temperature (F): 98.4 Height (in): 65 Pulse (bpm): 92 Weight (lbs): 196 Respiratory Rate (breaths/min): 16 Body Mass Index (BMI): 32.6 Blood Pressure (mmHg): 150/98 Capillary Blood Glucose (mg/dl): 186 Reference Range: 80 - 120 mg / dl Airway Pulse Oximetry (%): 100 Electronic Signature(s) Signed: 02/04/2021 10:02:05 AM By: [name] [name] , , Previous Signature: 02/04/2021 10:01:05 AM Version By: [name] [name] , , Entered By: [name] on 02/04/2021 10:02:04

## 2021-02-06 ENCOUNTER — Other Ambulatory Visit: Payer: Self-pay

## 2021-02-06 ENCOUNTER — Encounter (HOSPITAL_BASED_OUTPATIENT_CLINIC_OR_DEPARTMENT_OTHER): Payer: 59 | Admitting: Internal Medicine

## 2021-02-06 DIAGNOSIS — M86671 Other chronic osteomyelitis, right ankle and foot: Secondary | ICD-10-CM

## 2021-02-06 DIAGNOSIS — L97514 Non-pressure chronic ulcer of other part of right foot with necrosis of bone: Secondary | ICD-10-CM

## 2021-02-06 DIAGNOSIS — E11621 Type 2 diabetes mellitus with foot ulcer: Secondary | ICD-10-CM | POA: Diagnosis not present

## 2021-02-06 LAB — GLUCOSE, CAPILLARY
Glucose-Capillary: 173 mg/dL — ABNORMAL HIGH (ref 70–99)
Glucose-Capillary: 137 mg/dL — ABNORMAL HIGH (ref 70–99)

## 2021-02-06 NOTE — Progress Notes (Addendum)
[name], [name] ([id number]) Visit Report for 02/06/2021 Arrival Information Details Patient Name: Date of Service: [name]. 02/06/2021 8:00 A M Medical Record Number: [id number] Patient Account Number: [id number] Date of Birth/Sex: Treating RN: 1970-07-07 (50 y.o. [name], [name] Primary Care [name]: [name] Other Clinician: [name] Referring Kyan Yurkovich: Treating Deborah Dondero/Extender: [name], [name] in Treatment: 51 Visit Information History Since Last Visit All ordered tests and consults were completed: Yes Patient Arrived: [name] Added or deleted any medications: No Arrival Time: 07:40 Any new allergies or adverse reactions: No Accompanied By: self Had a fall or experienced change in No Transfer Assistance: None activities of daily living that may affect Patient Identification Verified: Yes risk of falls: Secondary Verification Process Completed: Yes Signs or symptoms of abuse/neglect since last visito No Patient Requires Transmission-Based Precautions: No Hospitalized since last visit: No Patient Has Alerts: No Implantable device outside of the clinic excluding No cellular tissue based products placed in the center since last visit: Pain Present Now: No Electronic Signature(s) Signed: 02/06/2021 9:36:31 AM By: [name] [name] , , Entered By: [name] on 02/06/2021 09:36:31 -------------------------------------------------------------------------------- Encounter Discharge Information Details Patient Name: Date of Service: [name] RD, [name] 02/06/2021 8:00 A M Medical Record Number: [id number] Patient Account Number: [id number] Date of Birth/Sex: Treating RN: 1970/12/21 (50 y.o. [name] Primary Care [name]: [name] Other Clinician: [name] Referring Ysela [name]: Treating [name]/Extender: [name], [name] in Treatment: 51 Encounter Discharge  Information Items Discharge Condition: Stable Ambulatory Status: Cane Discharge Destination: Other (Note Required) Transportation: Other Accompanied By: staff Schedule Follow-up Appointment: No Clinical Summary of Care: Notes Wound Care Encounter after treatment. Electronic Signature(s) Signed: 02/06/2021 10:36:08 AM By: [name] [name] , , Entered By: [name] on 02/06/2021 10:36:08 -------------------------------------------------------------------------------- Vitals Details Patient Name: Date of Service: [name] RD, [name] 02/06/2021 8:00 A M Medical Record Number: [id number] Patient Account Number: [id number] Date of Birth/Sex: Treating RN: 12-30-1970 (50 y.o. [name] Primary Care [name]: [name] Other Clinician: [name] Referring [name]: Treating [name]/Extender: [name], [name] in Treatment: 51 Vital Signs Time Taken: 07:59 Temperature (F): 98.1 Height (in): 65 Pulse (bpm): 97 Weight (lbs): 196 Respiratory Rate (breaths/min): 16 Body Mass Index (BMI): 32.6 Blood Pressure (mmHg): 144/96 Capillary Blood Glucose (mg/dl): 173 Reference Range: 80 - 120 mg / dl Electronic Signature(s) Signed: 02/06/2021 9:38:16 AM By: [name] [name] , , Entered By: [name] on 02/06/2021 09:38:16

## 2021-02-06 NOTE — Progress Notes (Addendum)
JOSEDEJESUS, MARCUM ([id number]) Visit Report for 02/06/2021 Arrival Information Details Patient Name: Date of Service: [place] [address], [name] 02/06/2021 10:30 A M Medical Record Number: [id number] Patient Account Number: [id number] Date of Birth/Sex: Treating RN: December 23, 1970 (50 y.o. [name] Primary Care Colden Samaras: [name] Other Clinician: Referring Rossie Scarfone: Treating Undine Nealis/Extender: [name], [name] in Treatment: 22 Visit Information History Since Last Visit Added or deleted any medications: No Patient Arrived: [name] Any new allergies or adverse reactions: No Arrival Time: 10:31 Had a fall or experienced change in No Accompanied By: self activities of daily living that may affect Transfer Assistance: None risk of falls: Patient Identification Verified: Yes Signs or symptoms of abuse/neglect since last visito No Patient Requires Transmission-Based Precautions: No Hospitalized since last visit: No Patient Has Alerts: No Implantable device outside of the clinic excluding No cellular tissue based products placed in the center since last visit: Pain Present Now: No Electronic Signature(s) Signed: 02/06/2021 12:08:26 PM By: Dellie Catholic RN Entered By: Dellie Catholic on 02/06/2021 10:33:34 -------------------------------------------------------------------------------- Encounter Discharge Information Details Patient Name: Date of Service: [name], [place] 02/06/2021 10:30 A M Medical Record Number: [id number] Patient Account Number: [id number] Date of Birth/Sex: Treating RN: [birth year] (49 y.o. [name] Primary Care Ilanna Deihl: [name] Other Clinician: Referring Annaya Bangert: Treating Cheveyo Virginia/Extender: [name], [name] in Treatment: 22 Encounter Discharge Information Items Discharge Condition: Stable Ambulatory Status: Cane Discharge Destination: Home Transportation: Private Auto Accompanied By:  self Schedule Follow-up Appointment: Yes Clinical Summary of Care: Patient Declined Electronic Signature(s) Signed: 02/09/2021 4:40:13 PM By: Baruch Gouty RN, BSN Entered By: Baruch Gouty on 02/06/2021 11:15:14 -------------------------------------------------------------------------------- Negative Pressure Wound Therapy Maintenance (NPWT) Details Patient Name: Date of Service: [place] RD, [name] 02/06/2021 10:30 A M Medical Record Number: [id number] Patient Account Number: [id number] Date of Birth/Sex: Treating RN: Jan 07, 1971 (50 y.o. [name] Primary Care Blandina Renaldo: [name] Other Clinician: Referring Milon Dethloff: Treating Jozeph Persing/Extender: [name], [name] in Treatment: 22 NPWT Maintenance Performed for: Wound #7 Right, Lateral, Plantar Foot Additional Injuries Covered: Yes Additional Injuries: Wound #3 Right, Proximal, Lateral Foot Performed By: Baruch Gouty, RN Type: VAC System Coverage Size (sq cm): 0.75 Pressure Type: Constant Pressure Setting: 125 mmHG Drain Type: None Sponge/Dressing Type: Gauze Date Initiated: 01/28/2021 Dressing Removed: No Quantity of Sponges/Gauze Removed: 2 Canister Changed: No Canister Exudate Volume: 10 Dressing Reapplied: No Quantity of Sponges/Gauze Inserted: 2 Respones T Treatment: o good Days On NPWT : 10 Electronic Signature(s) Signed: 02/09/2021 4:40:13 PM By: Baruch Gouty RN, BSN Entered By: Baruch Gouty on 02/06/2021 11:13:13 -------------------------------------------------------------------------------- Patient/Caregiver Education Details Patient Name: Date of Service: [name], [name] 11/11/2022andnbsp10:30 A M Medical Record Number: [id number] Patient Account Number: [id number] Date of Birth/Gender: Treating RN: 26-Nov-1970 (50 y.o. [name] Primary Care Physician: [name] Other Clinician: Referring Physician: Treating Physician/Extender: [name] Weeks in Treatment: 22 Education Assessment Education Provided To: Patient Education Topics Provided Offloading: Methods: Explain/Verbal Responses: Reinforcements needed, State content correctly Wound/Skin Impairment: Methods: Explain/Verbal Responses: Reinforcements needed, State content correctly Electronic Signature(s) Signed: 02/09/2021 4:40:13 PM By: Baruch Gouty RN, BSN Entered By: Baruch Gouty on 02/06/2021 11:14:37 -------------------------------------------------------------------------------- Wound Assessment Details Patient Name: Date of Service: [name], [place] [name] 02/06/2021 10:30 A M Medical Record Number: [id number] Patient Account Number: [id number] Date of Birth/Sex: Treating RN: [birth year] (50 y.o. [name] Primary Care Danelle Curiale: [name] Other Clinician: Referring Eliazer Hemphill: Treating Baylin Cabal/Extender: [name], Amy  Weeks in Treatment: 22 Wound Status Wound Number: 3 Primary Diabetic Wound/Ulcer of the Lower Extremity Etiology: Wound Location: Right, Proximal, Lateral Foot Wound Open Wounding Event: Gradually Appeared Status: Date Acquired: 10/29/2020 Comorbid Anemia, Coronary Artery Disease, Hypertension, Type II Weeks Of Treatment: 14 History: Diabetes, Osteomyelitis, Neuropathy Clustered Wound: No Wound Measurements Length: (cm) 0.5 Width: (cm) 0.6 Depth: (cm) 2.1 Area: (cm) 0.236 Volume: (cm) 0.495 % Reduction in Area: -87.3% % Reduction in Volume: -462.5% Epithelialization: None Wound Description Classification: Grade 3 Wound Margin: Well defined, not attached Exudate Amount: Medium Exudate Type: Sanguinous Exudate Color: red Foul Odor After Cleansing: No Slough/Fibrino No Wound Bed Granulation Amount: Large (67-100%) Exposed Structure Granulation Quality: Red Fascia Exposed: No Necrotic Amount: None Present (0%) Fat Layer (Subcutaneous Tissue) Exposed: Yes Tendon  Exposed: No Muscle Exposed: No Joint Exposed: No Bone Exposed: No Electronic Signature(s) Signed: 02/06/2021 12:08:26 PM By: Dellie Catholic RN Signed: 02/06/2021 12:16:14 PM By: Lorrin Jackson Entered By: Dellie Catholic on 02/06/2021 11:02:53 -------------------------------------------------------------------------------- Wound Assessment Details Patient Name: Date of Service: [name], [name] 02/06/2021 10:30 A M Medical Record Number: [id number] Patient Account Number: [id number] Date of Birth/Sex: Treating RN: [birth year] (50 y.o. [name] Primary Care Jolly Carlini: [name] Other Clinician: Referring Keyleigh Manninen: Treating Vineeth Fell/Extender: [name], [name] in Treatment: 22 Wound Status Wound Number: 7 Primary Diabetic Wound/Ulcer of the Lower Extremity Etiology: Etiology: Wound Location: Right, Lateral, Plantar Foot Wound Open Wounding Event: Gradually Appeared Status: Date Acquired: 12/31/2020 Comorbid Anemia, Coronary Artery Disease, Hypertension, Type II Weeks Of Treatment: 5 History: Diabetes, Osteomyelitis, Neuropathy Clustered Wound: No Wound Measurements Length: (cm) 0.9 Width: (cm) 0.5 Depth: (cm) 1 Area: (cm) 0.353 Volume: (cm) 0.353 % Reduction in Area: -80.1% % Reduction in Volume: -80.1% Epithelialization: None Wound Description Classification: Grade 3 Wound Margin: Well defined, not attached Exudate Amount: Medium Exudate Type: Sanguinous Exudate Color: red Foul Odor After Cleansing: No Slough/Fibrino No Wound Bed Granulation Amount: Large (67-100%) Exposed Structure Granulation Quality: Red Fascia Exposed: No Necrotic Amount: None Present (0%) Fat Layer (Subcutaneous Tissue) Exposed: Yes Tendon Exposed: No Muscle Exposed: No Joint Exposed: No Bone Exposed: No Electronic Signature(s) Signed: 02/06/2021 12:08:26 PM By: Dellie Catholic RN Signed: 02/06/2021 12:16:14 PM By: Lorrin Jackson Entered By:  Dellie Catholic on 02/06/2021 10:51:54 -------------------------------------------------------------------------------- Vitals Details Patient Name: Date of Service: [name], [name] 02/06/2021 10:30 A M Medical Record Number: [id number] Patient Account Number: [id number] Date of Birth/Sex: Treating RN: [birth year] (50 y.o. [name] Primary Care Chirstine Defrain: [name] Other Clinician: [name] Referring Blas Riches: Treating [name] [name]/Extender: [name], [name] in Treatment: 22 Vital Signs Time Taken: 10:13 Temperature (F): 97.8 Height (in): 65 Pulse (bpm): 91 Weight (lbs): 196 Respiratory Rate (breaths/min): 16 Body Mass Index (BMI): 32.6 Blood Pressure (mmHg): 154/96 Capillary Blood Glucose (mg/dl): 137 Reference Range: 80 - 120 mg / dl Electronic Signature(s) Signed: 02/06/2021 10:31:33 AM By: [name] [name] , , Entered By: [name] on 02/06/2021 10:30:53

## 2021-02-06 NOTE — Progress Notes (Addendum)
[name], [name] ([id number]) Visit Report for 02/06/2021 HBO Details Patient Name: Date of Service: [address], [name] 02/06/2021 8:00 A M Medical Record Number: [id number] Patient Account Number: [id number] Date of Birth/Sex: Treating RN: 04/01/1970 (50 y.o. [name] Primary Care [name]: [name] Other Clinician: Donavan [name] Referring [name]: Treating [name]/Extender: [name], [name] in Treatment: 22 HBO Treatment Course Details Treatment Course Number: 1 Ordering [name]: [name] Treatments Ordered: otal 40 HBO Treatment Start Date: 12/08/2020 HBO Indication: Diabetic Ulcer(s) of the Lower Extremity HBO Treatment Details Treatment Number: 31 Patient Type: Outpatient Chamber Type: Monoplace Chamber Serial #: [id number] Treatment Protocol: 2.5 ATA with 90 minutes oxygen, with two 5 minute air breaks Treatment Details Compression Rate Down: 2.0 psi / minute De-Compression Rate Up: 2.0 psi / minute A breaks and breathing ir Compress Tx Pressure periods Decompress Decompress Begins Reached (leave unused spaces Begins Ends blank) Chamber Pressure (ATA 1 2.5 2.5 2.5 2.5 2.5 - - 2.5 1 ) Clock Time (24 hr) 08:10 08:21 08:51 08:56 09:26 09:31 - - 10:01 10:12 Treatment Length: 122 (minutes) Treatment Segments: 4 Vital Signs Capillary Blood Glucose Reference Range: 80 - 120 mg / dl HBO Diabetic Blood Glucose Intervention Range: <131 mg/dl or >249 mg/dl Time Vitals Blood Respiratory Capillary Blood Glucose Pulse Action Type: Pulse: Temperature: Taken: Pressure: Rate: Glucose (mg/dl): Meter #: Oximetry (%) Taken: Pre 07:59 144/96 97 16 98.1 173 Post 10:13 154/96 91 16 97.8 137 Treatment Response Treatment Toleration: Well Treatment Completion Status: Treatment Completed without Adverse Event Additional Procedure Documentation Tissue Sevierity: Fat layer exposed Physician HBO Attestation: I certify that I supervised  this HBO treatment in accordance with Medicare guidelines. A trained emergency response team is readily available per Yes hospital policies and procedures. Continue HBOT as ordered. Yes Electronic Signature(s) Signed: 02/06/2021 12:34:14 PM By: Joseph Shan DO Previous Signature: 02/06/2021 10:34:52 AM Version By: [name] [name] , , Entered By: [name] on 02/06/2021 11:42:50 -------------------------------------------------------------------------------- HBO Safety Checklist Details Patient Name: Date of Service: [name] RD, [place] [name] 02/06/2021 8:00 A M Medical Record Number: [id number] Patient Account Number: [id number] Date of Birth/Sex: Treating RN: [birth year] (50 y.o. [name] Primary Care [name]: [name] Other Clinician: Donavan [name] Referring [name]: Treating [name]/Extender: [name], [name] in Treatment: 22 HBO Safety Checklist Items Safety Checklist Consent Form Signed Patient voided / foley secured and emptied When did you last eato 0630 Last dose of injectable or oral agent 0630 Ostomy pouch emptied and vented if applicable NA All implantable devices assessed, documented and approved NA Intravenous access site secured and place NA Valuables secured Linens and cotton and cotton/polyester blend (less than 51% polyester) Personal oil-based products / skin lotions / body lotions removed Wigs or hairpieces removed NA Smoking or tobacco materials removed NA Books / newspapers / magazines / loose paper removed Cologne, aftershave, perfume and deodorant removed Jewelry removed (may wrap wedding band) Make-up removed NA Hair care products removed Battery operated devices (external) removed Heating patches and chemical warmers removed Titanium eyewear removed NA Nail polish cured greater than 10 hours Casting material cured greater than 10 hours NA Hearing aids removed NA Loose dentures  or partials removed NA Prosthetics have been removed NA Patient demonstrates correct use of air break device (if applicable) Patient concerns have been addressed Patient grounding bracelet on and cord attached to chamber Specifics for Inpatients (complete in addition to above) Medication sheet sent with patient NA  Intravenous medications needed or due during therapy sent with patient NA Drainage tubes (e.g. nasogastric tube or chest tube secured and vented) NA Endotracheal or Tracheotomy tube secured NA Cuff deflated of air and inflated with saline NA Airway suctioned NA Notes Paper Version used prior to treatment. Electronic Signature(s) Signed: 02/06/2021 10:30:09 AM By: [name] [name] , , Previous Signature: 02/06/2021 9:39:13 AM Version By: [name] [name] , , Entered By: [name] on 02/06/2021 10:30:08

## 2021-02-06 NOTE — Progress Notes (Signed)
[name], [name] ([id number]) Visit Report for 02/06/2021 SuperBill Details Patient Name: Date of Service: [address], [name] 02/06/2021 Medical Record Number: [id number] Patient Account Number: [id number] Date of Birth/Sex: Treating RN: 09-16-1970 (50 y.o. [name] Primary Care Provider: [name] Other Clinician: [name] Referring Provider: Treating Provider/Extender: [name], [name] in Treatment: 22 Diagnosis Coding ICD-10 Codes Code Description [phone] Other chronic osteomyelitis, right ankle and foot E11.621 Type 2 diabetes mellitus with foot ulcer L97.514 Non-pressure chronic ulcer of other part of right foot with necrosis of bone T81.31XS Disruption of external operation (surgical) wound, not elsewhere classified, sequela F17.218 Nicotine dependence, cigarettes, with other nicotine-induced disorders L97.522 Non-pressure chronic ulcer of other part of left foot with fat layer exposed L84 Corns and callosities Facility Procedures CPT4 Code Description Modifier Quantity [id number] G0277-(Facility Use Only) HBOT full body chamber, 26min , 4 ICD-10 Diagnosis Description M86.671 Other chronic osteomyelitis, right ankle and foot E11.621 Type 2 diabetes mellitus with foot ulcer L97.514 Non-pressure chronic ulcer of other part of right foot with necrosis of bone Physician Procedures Quantity CPT4 Code Description Modifier [id number] 44818 - WC PHYS HYPERBARIC OXYGEN THERAPY 1 ICD-10 Diagnosis Description M86.671 Other chronic osteomyelitis, right ankle and foot E11.621 Type 2 diabetes mellitus with foot ulcer L97.514 Non-pressure chronic ulcer of other part of right foot with necrosis of bone Electronic Signature(s) Signed: 02/06/2021 10:35:21 AM By: [name] [name] , , Signed: 02/06/2021 12:34:14 PM By: Kalman Shan DO Entered By: [name] on 02/06/2021 10:35:19

## 2021-02-09 ENCOUNTER — Encounter (HOSPITAL_BASED_OUTPATIENT_CLINIC_OR_DEPARTMENT_OTHER): Payer: 59 | Admitting: Internal Medicine

## 2021-02-09 ENCOUNTER — Other Ambulatory Visit: Payer: Self-pay

## 2021-02-09 DIAGNOSIS — E11621 Type 2 diabetes mellitus with foot ulcer: Secondary | ICD-10-CM | POA: Diagnosis not present

## 2021-02-09 DIAGNOSIS — M86671 Other chronic osteomyelitis, right ankle and foot: Secondary | ICD-10-CM | POA: Diagnosis not present

## 2021-02-09 DIAGNOSIS — L97514 Non-pressure chronic ulcer of other part of right foot with necrosis of bone: Secondary | ICD-10-CM

## 2021-02-09 LAB — GLUCOSE, CAPILLARY
Glucose-Capillary: 217 mg/dL — ABNORMAL HIGH (ref 70–99)
Glucose-Capillary: 129 mg/dL — ABNORMAL HIGH (ref 70–99)

## 2021-02-09 NOTE — Progress Notes (Addendum)
CRIS, TALAVERA ([id number]) Visit Report for 02/09/2021 Arrival Information Details Patient Name: Date of Service: [address], [name] 02/09/2021 8:00 A M Medical Record Number: [id number] Patient Account Number: [id number] Date of Birth/Sex: Treating RN: 1970/06/17 (50 y.o. [name] Primary Care Mykal Batiz: [name] Other Clinician: [name] Referring Jerrol Helmers: Treating Junior Kenedy/Extender: [name], [name] in Treatment: 70 Visit Information History Since Last Visit All ordered tests and consults were completed: Yes Patient Arrived: [name] Added or deleted any medications: No Arrival Time: 07:34 Any new allergies or adverse reactions: No Accompanied By: self Had a fall or experienced change in No Transfer Assistance: None activities of daily living that may affect Patient Identification Verified: Yes risk of falls: Secondary Verification Process Completed: Yes Signs or symptoms of abuse/neglect since last visito No Patient Requires Transmission-Based Precautions: No Hospitalized since last visit: No Patient Has Alerts: No Implantable device outside of the clinic excluding No cellular tissue based products placed in the center since last visit: Pain Present Now: No Electronic Signature(s) Signed: 02/09/2021 8:56:45 AM By: [name] [name] , , Entered By: [name] on 02/09/2021 08:56:44 -------------------------------------------------------------------------------- Encounter Discharge Information Details Patient Name: Date of Service: [name], [place]. 02/09/2021 8:00 A M Medical Record Number: [id number] Patient Account Number: [id number] Date of Birth/Sex: Treating RN: December 30, 1970 (50 y.o. [name] Primary Care Yuliya Nova: [name] Other Clinician: [name] Referring Jannett Schmall: Treating Royden Bulman/Extender: [name], [name] in Treatment: 22 Encounter Discharge  Information Items Discharge Condition: Stable Ambulatory Status: Cane Discharge Destination: Other (Note Required) Transportation: Other Accompanied By: staff Schedule Follow-up Appointment: No Clinical Summary of Care: Notes Nurse Visit after treatment Electronic Signature(s) Signed: 02/09/2021 11:01:58 AM By: [name] [name] , , Entered By: [name] on 02/09/2021 11:01:58 -------------------------------------------------------------------------------- Vitals Details Patient Name: Date of Service: [name], [name] 02/09/2021 8:00 A M Medical Record Number: [id number] Patient Account Number: [id number] Date of Birth/Sex: Treating RN: 01-12-1971 (50 y.o. [name] Primary Care Shemica Meath: [name] Other Clinician: [name] Referring Caine Barfield: Treating Addyson Traub/Extender: [name], [name] in Treatment: 22 Vital Signs Time Taken: 07:55 Temperature (F): 97.5 Height (in): 65 Pulse (bpm): 94 Weight (lbs): 196 Respiratory Rate (breaths/min): 18 Body Mass Index (BMI): 32.6 Blood Pressure (mmHg): 153/93 Capillary Blood Glucose (mg/dl): 217 Reference Range: 80 - 120 mg / dl Electronic Signature(s) Signed: 02/09/2021 8:58:27 AM By: [name] [name] , , Entered By: [name] on 02/09/2021 08:58:27

## 2021-02-09 NOTE — Progress Notes (Signed)
[name], [name] ([id number]) Visit Report for 02/09/2021 SuperBill Details Patient Name: Date of Service: [address], [name] 02/09/2021 Medical Record Number: [id number] Patient Account Number: [id number] Date of Birth/Sex: Treating RN: [birth year] (50 y.o. [name] Primary Care Provider: [name] Other Clinician: [name] Referring Provider: Treating Provider/Extender: [name], [name] in Treatment: 22 Diagnosis Coding ICD-10 Codes Code Description [phone] Other chronic osteomyelitis, right ankle and foot E11.621 Type 2 diabetes mellitus with foot ulcer L97.514 Non-pressure chronic ulcer of other part of right foot with necrosis of bone T81.31XS Disruption of external operation (surgical) wound, not elsewhere classified, sequela F17.218 Nicotine dependence, cigarettes, with other nicotine-induced disorders L97.522 Non-pressure chronic ulcer of other part of left foot with fat layer exposed L84 Corns and callosities Facility Procedures CPT4 Code Description Modifier Quantity [id number] G0277-(Facility Use Only) HBOT full body chamber, 80min , 4 ICD-10 Diagnosis Description M86.671 Other chronic osteomyelitis, right ankle and foot E11.621 Type 2 diabetes mellitus with foot ulcer L97.514 Non-pressure chronic ulcer of other part of right foot with necrosis of bone Physician Procedures Quantity CPT4 Code Description Modifier [id number] 24199 - WC PHYS HYPERBARIC OXYGEN THERAPY 1 ICD-10 Diagnosis Description M86.671 Other chronic osteomyelitis, right ankle and foot E11.621 Type 2 diabetes mellitus with foot ulcer L97.514 Non-pressure chronic ulcer of other part of right foot with necrosis of bone Electronic Signature(s) Signed: 02/09/2021 11:00:55 AM By: [name] [name] , , Signed: 02/09/2021 4:19:32 PM By: Kalman Shan DO Entered By: [name] on 02/09/2021 11:00:54

## 2021-02-09 NOTE — Progress Notes (Signed)
MILOH, ALCOCER ([id number]) Visit Report for 02/09/2021 SuperBill Details Patient Name: Date of Service: [address], [name] 02/09/2021 Medical Record Number: [id number] Patient Account Number: [id number] Date of Birth/Sex: Treating RN: 1971-02-27 (50 y.o. [name] Primary Care Provider: [name] Other Clinician: Referring Provider: Treating Provider/Extender: [name], [name] in Treatment: 22 Diagnosis Coding ICD-10 Codes Code Description [phone] Other chronic osteomyelitis, right ankle and foot E11.621 Type 2 diabetes mellitus with foot ulcer L97.514 Non-pressure chronic ulcer of other part of right foot with necrosis of bone T81.31XS Disruption of external operation (surgical) wound, not elsewhere classified, sequela F17.218 Nicotine dependence, cigarettes, with other nicotine-induced disorders L97.522 Non-pressure chronic ulcer of other part of left foot with fat layer exposed L84 Corns and callosities Facility Procedures CPT4 Code Description Modifier Quantity [id number] 97605 - WOUND VAC-50 SQ CM OR LESS 1 Electronic Signature(s) Signed: 02/09/2021 4:19:32 PM By: Kalman Shan DO Signed: 02/09/2021 4:40:13 PM By: Baruch Gouty RN, BSN Entered By: Baruch Gouty on 02/09/2021 11:29:08

## 2021-02-09 NOTE — Progress Notes (Signed)
[name], [name] ([id number]) Visit Report for 02/06/2021 SuperBill Details Patient Name: Date of Service: [address], [name] 02/06/2021 Medical Record Number: [id number] Patient Account Number: [id number] Date of Birth/Sex: Treating RN: Jun 11, 1970 (50 y.o. [name] Primary Care Provider: [name] Other Clinician: Referring Provider: Treating Provider/Extender: [name], [name] in Treatment: 22 Diagnosis Coding ICD-10 Codes Code Description [phone] Other chronic osteomyelitis, right ankle and foot E11.621 Type 2 diabetes mellitus with foot ulcer L97.514 Non-pressure chronic ulcer of other part of right foot with necrosis of bone T81.31XS Disruption of external operation (surgical) wound, not elsewhere classified, sequela F17.218 Nicotine dependence, cigarettes, with other nicotine-induced disorders L97.522 Non-pressure chronic ulcer of other part of left foot with fat layer exposed L84 Corns and callosities Facility Procedures CPT4 Code Description Modifier Quantity [id number] 97605 - WOUND VAC-50 SQ CM OR LESS 1 Electronic Signature(s) Signed: 02/06/2021 12:34:14 PM By: Kalman Shan DO Signed: 02/09/2021 4:40:13 PM By: Baruch Gouty RN, BSN Entered By: Baruch Gouty on 02/06/2021 11:15:43

## 2021-02-09 NOTE — Progress Notes (Signed)
RUBLE, PUMPHREY ([id number]) Visit Report for 02/09/2021 Arrival Information Details Patient Name: Date of Service: [place] [address], [name] 02/09/2021 11:00 A M Medical Record Number: [id number] Patient Account Number: [id number] Date of Birth/Sex: Treating RN: 09-Jul-1970 (50 y.o. [name], [name] Primary Care Deloy Archey: Minette Brine, [place] Other Clinician: Referring Benelli Winther: Treating Rama Sorci/Extender: [name], [name] in Treatment: 22 Visit Information History Since Last Visit Added or deleted any medications: No Patient Arrived: Ambulatory Any new allergies or adverse reactions: No Arrival Time: 10:45 Had a fall or experienced change in No Accompanied By: self activities of daily living that may affect Transfer Assistance: None risk of falls: Patient Identification Verified: Yes Signs or symptoms of abuse/neglect since last visito No Secondary Verification Process Completed: Yes Hospitalized since last visit: No Patient Requires Transmission-Based Precautions: No Implantable device outside of the clinic excluding No Patient Has Alerts: No cellular tissue based products placed in the center since last visit: Has Dressing in Place as Prescribed: Yes Pain Present Now: Yes Electronic Signature(s) Signed: 02/09/2021 4:40:13 PM By: Baruch Gouty RN, BSN Entered By: Baruch Gouty on 02/09/2021 10:46:13 -------------------------------------------------------------------------------- Encounter Discharge Information Details Patient Name: Date of Service: [name], [place] [name] 02/09/2021 11:00 A M Medical Record Number: [id number] Patient Account Number: [id number] Date of Birth/Sex: Treating RN: 16-Feb-1971 (50 y.o. [name] Primary Care Ameet Sandy: [name] Other Clinician: Referring Yukio Bisping: Treating Sheletha Bow/Extender: [name], [name] in Treatment: 22 Encounter Discharge Information Items Discharge Condition:  Stable Ambulatory Status: Ambulatory Discharge Destination: Home Transportation: Private Auto Accompanied By: self Schedule Follow-up Appointment: Yes Clinical Summary of Care: Patient Declined Electronic Signature(s) Signed: 02/09/2021 4:40:13 PM By: Baruch Gouty RN, BSN Entered By: Baruch Gouty on 02/09/2021 11:29:03 -------------------------------------------------------------------------------- Negative Pressure Wound Therapy Maintenance (NPWT) Details Patient Name: Date of Service: [place] RD, [name] 02/09/2021 11:00 A M Medical Record Number: [id number] Patient Account Number: [id number] Date of Birth/Sex: Treating RN: 11-28-1970 (50 y.o. [name] Primary Care Mayda Shippee: [name] Other Clinician: Referring Okie Jansson: Treating Jumaane Weatherford/Extender: [name], [name] in Treatment: 22 NPWT Maintenance Performed for: Wound #7 Right, Lateral, Plantar Foot Additional Injuries Covered: Yes Additional Injuries: Wound #3 Right, Proximal, Lateral Foot Performed By: Baruch Gouty, RN Type: VAC System Coverage Size (sq cm): 0.75 Pressure Type: Constant Pressure Setting: 125 mmHG Drain Type: None Sponge/Dressing Type: Gauze Date Initiated: 01/28/2021 Dressing Removed: No Quantity of Sponges/Gauze Removed: 2 Canister Changed: No Canister Exudate Volume: 5 Dressing Reapplied: No Quantity of Sponges/Gauze Inserted: 1 Respones T Treatment: o good Days On NPWT : 13 Notes stoma paste used on periwound Electronic Signature(s) Signed: 02/09/2021 4:40:13 PM By: Baruch Gouty RN, BSN Entered By: Baruch Gouty on 02/09/2021 11:28:04 -------------------------------------------------------------------------------- Patient/Caregiver Education Details Patient Name: Date of Service: [name], [place] 11/14/2022andnbsp11:00 A M Medical Record Number: [id number] Patient Account Number: [id number] Date of Birth/Gender: Treating RN: 1970-12-06  (50 y.o. [name] Primary Care Physician: [name] Other Clinician: Referring Physician: Treating Physician/Extender: [name] Weeks in Treatment: 22 Education Assessment Education Provided To: Patient Education Topics Provided Infection: Methods: Explain/Verbal Responses: Reinforcements needed, State content correctly Wound/Skin Impairment: Methods: Explain/Verbal Responses: Reinforcements needed, State content correctly Electronic Signature(s) Signed: 02/09/2021 4:40:13 PM By: Baruch Gouty RN, BSN Entered By: Baruch Gouty on 02/09/2021 11:28:47 -------------------------------------------------------------------------------- Wound Assessment Details Patient Name: Date of Service: [name], [name] 02/09/2021 11:00 A M Medical Record Number: [id number] Patient Account Number: [id number] Date of Birth/Sex: Treating RN: 1970/11/20 (50  y.o. [name] Primary Care Eloni Darius: [name] Other Clinician: Referring Shyquan Stallbaumer: Treating Kirke Breach/Extender: [name], [name] in Treatment: 22 Wound Status Wound Number: 3 Primary Diabetic Wound/Ulcer of the Lower Extremity Etiology: Wound Location: Right, Proximal, Lateral Foot Wound Open Wounding Event: Gradually Appeared Status: Date Acquired: 10/29/2020 Comorbid Anemia, Coronary Artery Disease, Hypertension, Type II Weeks Of Treatment: 14 History: Diabetes, Osteomyelitis, Neuropathy Clustered Wound: No Wound Measurements Length: (cm) 0.5 Width: (cm) 0.6 Depth: (cm) 2.1 Area: (cm) 0.236 Volume: (cm) 0.495 % Reduction in Area: -87.3% % Reduction in Volume: -462.5% Epithelialization: Small (1-33%) Tunneling: No Undermining: No Wound Description Classification: Grade 3 Wound Margin: Well defined, not attached Exudate Amount: Medium Exudate Type: Serosanguineous Exudate Color: red, brown Foul Odor After Cleansing: No Slough/Fibrino No Wound  Bed Granulation Amount: Large (67-100%) Exposed Structure Granulation Quality: Red Fascia Exposed: No Necrotic Amount: None Present (0%) Fat Layer (Subcutaneous Tissue) Exposed: Yes Tendon Exposed: No Muscle Exposed: No Joint Exposed: No Bone Exposed: No Treatment Notes Wound #3 (Foot) Wound Laterality: Right, Lateral, Proximal Cleanser Soap and Water Discharge Instruction: May shower and wash wound with dial antibacterial soap and water prior to dressing change. Wound Cleanser Discharge Instruction: Cleanse the wound with wound cleanser prior to applying a clean dressing using gauze sponges, not tissue or cotton balls. Peri-Wound Care Sween Lotion (Moisturizing lotion) Discharge Instruction: Apply moisturizing lotion to foot with dressing changes Topical Primary Dressing VAC Secondary Dressing Secured With Coban Self-Adherent Wrap 4x5 (in/yd) Discharge Instruction: Secure with Coban as directed. Kerlix Roll Sterile, 4.5x3.1 (in/yd) Discharge Instruction: Secure with Kerlix as directed. Compression Wrap Compression Stockings Add-Ons Electronic Signature(s) Signed: 02/09/2021 4:40:13 PM By: Baruch Gouty RN, BSN Entered By: Baruch Gouty on 02/09/2021 11:26:22 -------------------------------------------------------------------------------- Wound Assessment Details Patient Name: Date of Service: [name], [name] 02/09/2021 11:00 A M Medical Record Number: [id number] Patient Account Number: [id number] Date of Birth/Sex: Treating RN: 1970-08-16 (50 y.o. [name] Primary Care Belynda Pagaduan: [name] Other Clinician: Referring Kayhan Boardley: Treating Nakina Spatz/Extender: [name], [name] in Treatment: 22 Wound Status Wound Number: 7 Primary Diabetic Wound/Ulcer of the Lower Extremity Etiology: Wound Location: Right, Lateral, Plantar Foot Wound Open Wounding Event: Gradually Appeared Status: Date Acquired: 12/31/2020 Comorbid Anemia,  Coronary Artery Disease, Hypertension, Type II Weeks Of Treatment: 5 History: Diabetes, Osteomyelitis, Neuropathy Clustered Wound: No Wound Measurements Length: (cm) 0.9 Width: (cm) 0.5 Depth: (cm) 1 Area: (cm) 0.353 Volume: (cm) 0.353 % Reduction in Area: -80.1% % Reduction in Volume: -80.1% Epithelialization: None Tunneling: No Undermining: No Wound Description Classification: Grade 3 Wound Margin: Well defined, not attached Exudate Amount: Medium Exudate Type: Serosanguineous Exudate Color: red, brown Foul Odor After Cleansing: No Slough/Fibrino No Wound Bed Granulation Amount: Large (67-100%) Exposed Structure Granulation Quality: Red Fascia Exposed: No Necrotic Amount: None Present (0%) Fat Layer (Subcutaneous Tissue) Exposed: Yes Tendon Exposed: No Muscle Exposed: No Joint Exposed: No Bone Exposed: No Assessment Notes macerated wound margins Treatment Notes Wound #7 (Foot) Wound Laterality: Plantar, Right, Lateral Cleanser Soap and Water Discharge Instruction: May shower and wash wound with dial antibacterial soap and water prior to dressing change. Wound Cleanser Discharge Instruction: Cleanse the wound with wound cleanser prior to applying a clean dressing using gauze sponges, not tissue or cotton balls. Peri-Wound Care Sween Lotion (Moisturizing lotion) Discharge Instruction: Apply moisturizing lotion to foot with dressing changes Topical Primary Dressing VAC Secondary Dressing Secured With Coban Self-Adherent Wrap 4x5 (in/yd) Discharge Instruction: Secure with Coban as directed. Kerlix Roll Sterile, 4.5x3.1 (  in/yd) Discharge Instruction: Secure with Kerlix as directed. Compression Wrap Compression Stockings Add-Ons Electronic Signature(s) Signed: 02/09/2021 4:40:13 PM By: Baruch Gouty RN, BSN Entered By: Baruch Gouty on 02/09/2021 11:26:50 -------------------------------------------------------------------------------- Vitals  Details Patient Name: Date of Service: [name], [name] 02/09/2021 11:00 A M Medical Record Number: [id number] Patient Account Number: [id number] Date of Birth/Sex: Treating RN: 1971/02/25 (50 y.o. [name] Primary Care Marthella Osorno: [name] Other Clinician: Referring Shirla Hodgkiss: Treating Leon Goodnow/Extender: [name], [name] in Treatment: 22 Vital Signs Time Taken: 10:46 Temperature (F): 97.5 Height (in): 65 Pulse (bpm): 84 Weight (lbs): 196 Respiratory Rate (breaths/min): 18 Body Mass Index (BMI): 32.6 Blood Pressure (mmHg): 140/89 Capillary Blood Glucose (mg/dl): 129 Reference Range: 80 - 120 mg / dl Notes see HBO encounter Electronic Signature(s) Signed: 02/09/2021 10:56:21 AM By: [name] [name] , , Entered By: [name] on 02/09/2021 10:49:58

## 2021-02-09 NOTE — Progress Notes (Addendum)
FOTIOS, AMOS ([id number]) Visit Report for 02/09/2021 HBO Details Patient Name: Date of Service: [place] RD, [name] 02/09/2021 8:00 A M Medical Record Number: [id number] Patient Account Number: [id number] Date of Birth/Sex: Treating RN: 12/08/1970 (50 y.o. [name] Primary Care Markell Schrier: [name] Other Clinician: [name] Referring Zenaya Ulatowski: Treating Marabelle Cushman/Extender: [name], [name] in Treatment: 22 HBO Treatment Course Details Treatment Course Number: 1 Ordering Franke Menter: [name] Treatments Ordered: otal 40 HBO Treatment Start Date: 12/08/2020 HBO Indication: Diabetic Ulcer(s) of the Lower Extremity HBO Treatment Details Treatment Number: 32 Patient Type: Outpatient Chamber Type: Monoplace Chamber Serial #: [id number] Treatment Protocol: 2.5 ATA with 90 minutes oxygen, with two 5 minute air breaks Treatment Details Compression Rate Down: 2.0 psi / minute De-Compression Rate Up: 2.5 psi / minute A breaks and breathing ir Compress Tx Pressure periods Decompress Decompress Begins Reached (leave unused spaces Begins Ends blank) Chamber Pressure (ATA 1 2.5 2.5 2.5 2.5 2.5 - - 2.5 1 ) Clock Time (24 hr) 08:26 08:35 09:05 09:10 09:40 09:45 - - 10:15 10:25 Treatment Length: 119 (minutes) Treatment Segments: 4 Vital Signs Capillary Blood Glucose Reference Range: 80 - 120 mg / dl HBO Diabetic Blood Glucose Intervention Range: <131 mg/dl or >249 mg/dl Type: Time Vitals Blood Pulse: Respiratory Temperature: Capillary Blood Glucose Pulse Action Taken: Pressure: Rate: Glucose (mg/dl): Meter #: Oximetry (%) Taken: Pre 07:55 153/93 94 18 97.5 217 Post 10:28 140/89 84 18 97.5 129 discharge per protocol >101 mg/dL Treatment Response Treatment Toleration: Well Treatment Completion Status: Treatment Completed without Adverse Event Additional Procedure Documentation Tissue Sevierity: Fat layer exposed Physician HBO  Attestation: I certify that I supervised this HBO treatment in accordance with Medicare guidelines. A trained emergency response team is readily available per Yes hospital policies and procedures. Continue HBOT as ordered. Yes Electronic Signature(s) Signed: 02/09/2021 4:19:32 PM By: Kalman Shan DO Previous Signature: 02/09/2021 11:00:29 AM Version By: [name] [name] , , Entered By: Kalman Shan on 02/09/2021 16:17:34 -------------------------------------------------------------------------------- HBO Safety Checklist Details Patient Name: Date of Service: [name], [place] [name] 02/09/2021 8:00 A M Medical Record Number: [id number] Patient Account Number: [id number] Date of Birth/Sex: Treating RN: September 29, 1970 (51 y.o. [name] Primary Care Taryn Shellhammer: [name] Other Clinician: [name] Referring Levester Waldridge: Treating Cashmere Dingley/Extender: [name], [name] in Treatment: 22 HBO Safety Checklist Items Safety Checklist Consent Form Signed Patient voided / foley secured and emptied When did you last eato Breakfast Last dose of injectable or oral agent 0630 Ostomy pouch emptied and vented if applicable NA All implantable devices assessed, documented and approved NA Intravenous access site secured and place NA Valuables secured Linens and cotton and cotton/polyester blend (less than 51% polyester) Personal oil-based products / skin lotions / body lotions removed Wigs or hairpieces removed NA Smoking or tobacco materials removed NA Books / newspapers / magazines / loose paper removed Cologne, aftershave, perfume and deodorant removed Jewelry removed (may wrap wedding band) Make-up removed NA Hair care products removed NA Battery operated devices (external) removed Heating patches and chemical warmers removed Titanium eyewear removed NA Nail polish cured greater than 10 hours NA Casting material cured greater than 10  hours NA Hearing aids removed NA Loose dentures or partials removed NA Prosthetics have been removed NA Patient demonstrates correct use of air break device (if applicable) Patient concerns have been addressed Patient grounding bracelet on and cord attached to chamber Specifics for Inpatients (complete in addition to  above) Medication sheet sent with patient NA Intravenous medications needed or due during therapy sent with patient NA Drainage tubes (e.g. nasogastric tube or chest tube secured and vented) NA Endotracheal or Tracheotomy tube secured NA Cuff deflated of air and inflated with saline NA Airway suctioned NA Notes Paper version used prior to treatment. Electronic Signature(s) Signed: 02/09/2021 10:57:59 AM By: [name] [name] , , Previous Signature: 02/09/2021 9:02:09 AM Version By: [name] [name] , , Entered By: [name] on 02/09/2021 10:57:58

## 2021-02-10 ENCOUNTER — Encounter (HOSPITAL_BASED_OUTPATIENT_CLINIC_OR_DEPARTMENT_OTHER): Payer: 59 | Admitting: Internal Medicine

## 2021-02-10 DIAGNOSIS — E11621 Type 2 diabetes mellitus with foot ulcer: Secondary | ICD-10-CM | POA: Diagnosis not present

## 2021-02-10 LAB — GLUCOSE, CAPILLARY
Glucose-Capillary: 201 mg/dL — ABNORMAL HIGH (ref 70–99)
Glucose-Capillary: 155 mg/dL — ABNORMAL HIGH (ref 70–99)

## 2021-02-10 NOTE — Progress Notes (Addendum)
[name], [name] ([id number]) Visit Report for 02/10/2021 Arrival Information [name] Patient Name: Date of Service: [name], [name] [name]. 02/10/2021 8:00 A M Medical Record Number: [id number] Patient Account Number: [id number] Date of Birth/Sex: Treating RN: 1970-11-11 (50 y.o. [name], [name] Primary Care Gor Vestal: [name] Other Clinician: Donavan [name] Referring [name]: Treating [name]/Extender: [name], [name] in Treatment: 40 Visit Information History Since Last Visit All ordered tests and consults were completed: Yes Patient Arrived: [name] Added or deleted any medications: No Arrival Time: 07:34 Any new allergies or adverse reactions: No Accompanied By: self Had a fall or experienced change in No Transfer Assistance: None activities of daily living that may affect Patient Identification Verified: Yes risk of falls: Secondary Verification Process Completed: Yes Signs or symptoms of abuse/neglect since last visito No Patient Requires Transmission-Based Precautions: No Hospitalized since last visit: No Patient Has Alerts: No Implantable device outside of the clinic excluding No cellular tissue based products placed in the center since last visit: Pain Present Now: No Electronic Signature(s) Signed: 02/10/2021 8:52:23 AM By: [name] [name] , , Entered By: [name] on 02/10/2021 08:52:23 -------------------------------------------------------------------------------- Encounter Discharge Information [name] Patient Name: Date of Service: [name], [name]. 02/10/2021 8:00 A M Medical Record Number: [id number] Patient Account Number: [id number] Date of Birth/Sex: Treating RN: [birth year] (50 y.o. [name] Primary Care [name]: [name] Other Clinician: Donavan [name] Referring [name]: Treating [name]/Extender: [name], [name] in Treatment: 22 Encounter Discharge  Information Items Discharge Condition: Stable Ambulatory Status: Cane Discharge Destination: Home Transportation: Private Auto Accompanied By: self Schedule Follow-up Appointment: No Clinical Summary of Care: Electronic Signature(s) Signed: 02/10/2021 10:54:03 AM By: [name] [name] , , Entered By: [name] on 02/10/2021 10:54:03 -------------------------------------------------------------------------------- [name] Patient Name: Date of Service: Joseph Hahn [name], [name]. 02/10/2021 8:00 A M Medical Record Number: [id number] Patient Account Number: [id number] Date of Birth/Sex: Treating RN: 28-May-1970 (50 y.o. [name], [name] Primary Care [name]: [name] Other Clinician: Donavan [name] Referring [name]: Treating [name]/Extender: [name], [name] in Treatment: 22 Vital Signs Time Taken: 08:01 Temperature (F): 97.6 Height (in): 65 Pulse (bpm): 93 Weight (lbs): 196 Respiratory Rate (breaths/min): 16 Body Mass Index (BMI): 32.6 Blood Pressure (mmHg): 141/79 Capillary Blood Glucose (mg/dl): 201 Reference Range: 80 - 120 mg / dl Electronic Signature(s) Signed: 02/10/2021 8:57:56 AM By: [name] [name] , , Entered By: [name] on 02/10/2021 08:57:55

## 2021-02-10 NOTE — Progress Notes (Signed)
XANDER, JUTRAS ([id number]) Visit Report for 02/10/2021 SuperBill Details Patient Name: Date of Service: [place] RD, [name] 02/10/2021 Medical Record Number: [id number] Patient Account Number: [id number] Date of Birth/Sex: Treating RN: [birth year] (50 y.o. [name], [name] Primary Care Provider: [name] Other Clinician: [name] Referring Provider: Treating Provider/Extender: [name], [name] in Treatment: 22 Diagnosis Coding ICD-10 Codes Code Description [phone] Other chronic osteomyelitis, right ankle and foot E11.621 Type 2 diabetes mellitus with foot ulcer L97.514 Non-pressure chronic ulcer of other part of right foot with necrosis of bone T81.31XS Disruption of external operation (surgical) wound, not elsewhere classified, sequela F17.218 Nicotine dependence, cigarettes, with other nicotine-induced disorders L97.522 Non-pressure chronic ulcer of other part of left foot with fat layer exposed L84 Corns and callosities Facility Procedures CPT4 Code Description Modifier Quantity [id number]-(Facility Use Only) HBOT full body chamber, 34min , 4 ICD-10 Diagnosis Description M86.671 Other chronic osteomyelitis, right ankle and foot E11.621 Type 2 diabetes mellitus with foot ulcer L97.514 Non-pressure chronic ulcer of other part of right foot with necrosis of bone Physician Procedures Quantity CPT4 Code Description Modifier [id number] - WC PHYS HYPERBARIC OXYGEN THERAPY 1 ICD-10 Diagnosis Description M86.671 Other chronic osteomyelitis, right ankle and foot E11.621 Type 2 diabetes mellitus with foot ulcer L97.514 Non-pressure chronic ulcer of other part of right foot with necrosis of bone Electronic Signature(s) Signed: 02/10/2021 10:53:25 AM By: [name] [name] , , Signed: 02/10/2021 4:37:23 PM By: [name] MD Entered By: [name] on 02/10/2021 10:53:23

## 2021-02-10 NOTE — Progress Notes (Addendum)
[name], [name] ([id number]) Visit Report for 02/10/2021 HBO Details Patient Name: Date of Service: [address] [place] [address], [name] 02/10/2021 8:00 A M Medical Record Number: [id number] Patient Account Number: [id number] Date of Birth/Sex: Treating RN: [birth year] (50 y.o. [name], [name] Primary Care Nalda Shackleford: [name] Other Clinician: [name] Referring Kobe Jansma: Treating Yashica Sterbenz/Extender: [name], [name] in Treatment: 22 HBO Treatment Course Details Treatment Course Number: 1 Ordering Aveya Beal: [name] Treatments Ordered: otal 40 HBO Treatment Start Date: 12/08/2020 HBO Indication: Diabetic Ulcer(s) of the Lower Extremity HBO Treatment Details Treatment Number: 33 Patient Type: Outpatient Chamber Type: Monoplace Chamber Serial #: [id number] Treatment Protocol: 2.5 ATA with 90 minutes oxygen, with two 5 minute air breaks Treatment Details Compression Rate Down: 2.0 psi / minute De-Compression Rate Up: 2.0 psi / minute A breaks and breathing ir Compress Tx Pressure periods Decompress Decompress Begins Reached (leave unused spaces Begins Ends blank) Chamber Pressure (ATA 1 2.5 2.5 2.5 2.5 2.5 - - 2.5 1 ) Clock Time (24 hr) 08:08 08:19 08:49 08:54 09:24 09:29 - - 09:59 10:10 Treatment Length: 122 (minutes) Treatment Segments: 4 Vital Signs Capillary Blood Glucose Reference Range: 80 - 120 mg / dl HBO Diabetic Blood Glucose Intervention Range: <131 mg/dl or >249 mg/dl Time Vitals Blood Respiratory Capillary Blood Glucose Pulse Action Type: Pulse: Temperature: Taken: Pressure: Rate: Glucose (mg/dl): Meter #: Oximetry (%) Taken: Pre 08:01 141/79 93 16 97.6 201 Post 10:21 134/83 83 16 97.7 155 Treatment Response Treatment Toleration: Well Treatment Completion Status: Treatment Completed without Adverse Event Additional Procedure Documentation Tissue Sevierity: Fat layer exposed Shuntay Everetts Notes No concerns with treatment  given Physician HBO Attestation: I certify that I supervised this HBO treatment in accordance with Medicare guidelines. A trained emergency response team is readily available per Yes hospital policies and procedures. Continue HBOT as ordered. Yes Electronic Signature(s) Signed: 02/10/2021 4:37:23 PM By: [name] MD Previous Signature: 02/10/2021 12:48:39 PM Version By: [name] [name] , , Previous Signature: 02/10/2021 10:52:50 AM Version By: [name] [name] , , Entered By: [name] on 02/10/2021 16:35:29 -------------------------------------------------------------------------------- HBO Safety Checklist Details Patient Name: Date of Service: [name] [address], [place] [name] 02/10/2021 8:00 A M Medical Record Number: [id number] Patient Account Number: [id number] Date of Birth/Sex: Treating RN: December 24, 1970 (50 y.o. [name], [name] Primary Care Roxane Puerto: [name] Other Clinician: [name] Referring Copper Basnett: Treating Kimia Finan/Extender: [name], [name] in Treatment: 22 HBO Safety Checklist Items Safety Checklist Consent Form Signed Patient voided / foley secured and emptied When did you last eato 0630 Last dose of injectable or oral agent 0630 Ostomy pouch emptied and vented if applicable NA All implantable devices assessed, documented and approved NA Intravenous access site secured and place NA Valuables secured Linens and cotton and cotton/polyester blend (less than 51% polyester) Personal oil-based products / skin lotions / body lotions removed Wigs or hairpieces removed NA Smoking or tobacco materials removed NA Books / newspapers / magazines / loose paper removed Cologne, aftershave, perfume and deodorant removed Jewelry removed (may wrap wedding band) Make-up removed NA Hair care products removed Battery operated devices (external) removed Heating patches and chemical warmers removed Titanium  eyewear removed NA Nail polish cured greater than 10 hours NA Casting material cured greater than 10 hours NA Hearing aids removed NA Loose dentures or partials removed NA Prosthetics have been removed NA Patient demonstrates correct use of air break device (if applicable) Patient concerns have been addressed Patient  grounding bracelet on and cord attached to chamber Specifics for Inpatients (complete in addition to above) Medication sheet sent with patient NA Intravenous medications needed or due during therapy sent with patient NA Drainage tubes (e.g. nasogastric tube or chest tube secured and vented) NA Endotracheal or Tracheotomy tube secured NA Cuff deflated of air and inflated with saline NA Airway suctioned NA Notes Paper version used prior to treatment. Electronic Signature(s) Signed: 02/10/2021 10:47:52 AM By: [name] [name] , , Previous Signature: 02/10/2021 8:59:23 AM Version By: [name] [name] , , Entered By: [name] on 02/10/2021 10:47:51

## 2021-02-11 ENCOUNTER — Encounter (HOSPITAL_BASED_OUTPATIENT_CLINIC_OR_DEPARTMENT_OTHER): Payer: 59 | Admitting: Physician Assistant

## 2021-02-11 ENCOUNTER — Other Ambulatory Visit: Payer: Self-pay

## 2021-02-11 DIAGNOSIS — E11621 Type 2 diabetes mellitus with foot ulcer: Secondary | ICD-10-CM | POA: Diagnosis not present

## 2021-02-11 LAB — GLUCOSE, CAPILLARY
Glucose-Capillary: 233 mg/dL — ABNORMAL HIGH (ref 70–99)
Glucose-Capillary: 123 mg/dL — ABNORMAL HIGH (ref 70–99)

## 2021-02-11 NOTE — Progress Notes (Signed)
TURNER, BAILLIE ([id number]) Visit Report for 02/11/2021 SuperBill Details Patient Name: Date of Service: [place] [address], [name] 02/11/2021 Medical Record Number: [id number] Patient Account Number: [id number] Date of Birth/Sex: Treating RN: October 30, 1970 (50 y.o. [name], [name] Primary Care Provider: [name] Other Clinician: [name] Referring Provider: Treating Provider/Extender: [name], [name] in Treatment: 22 Diagnosis Coding ICD-10 Codes Code Description [phone] Other chronic osteomyelitis, right ankle and foot E11.621 Type 2 diabetes mellitus with foot ulcer L97.514 Non-pressure chronic ulcer of other part of right foot with necrosis of bone T81.31XS Disruption of external operation (surgical) wound, not elsewhere classified, sequela F17.218 Nicotine dependence, cigarettes, with other nicotine-induced disorders L97.522 Non-pressure chronic ulcer of other part of left foot with fat layer exposed L84 Corns and callosities Facility Procedures CPT4 Code Description Modifier Quantity [id number]-(Facility Use Only) HBOT full body chamber, 6min , 4 ICD-10 Diagnosis Description M86.671 Other chronic osteomyelitis, right ankle and foot E11.621 Type 2 diabetes mellitus with foot ulcer Physician Procedures Quantity CPT4 Code Description Modifier [id number] 10272 - WC PHYS HYPERBARIC OXYGEN THERAPY 1 ICD-10 Diagnosis Description M86.671 Other chronic osteomyelitis, right ankle and foot E11.621 Type 2 diabetes mellitus with foot ulcer Electronic Signature(s) Signed: 02/11/2021 10:46:08 AM By: [name] [name] , , Signed: 02/11/2021 4:59:49 PM By: Worthy Keeler PA-C Entered By: [name] on 02/11/2021 10:46:00

## 2021-02-11 NOTE — Progress Notes (Signed)
Joseph, [name] ([id number]) Visit Report for 02/11/2021 Arrival Information Details Patient Name: Date of Service: [name], [name] 02/11/2021 10:30 A M Medical Record Number: [id number] Patient Account Number: [id number] Date of Birth/Sex: Treating RN: 09-29-1970 (50 y.o. [name] Primary Care [name]: [name] Other Clinician: Referring [name]: Treating [name]/Extender: [name], [name] in Treatment: 22 Visit Information History Since Last Visit Added or deleted any medications: No Patient Arrived: Ambulatory Any new allergies or adverse reactions: No Arrival Time: 10:20 Had a fall or experienced change in No Accompanied By: self activities of daily living that may affect Transfer Assistance: None risk of falls: Patient Identification Verified: Yes Signs or symptoms of abuse/neglect since last visito No Secondary Verification Process Completed: Yes Hospitalized since last visit: No Patient Requires Transmission-Based Precautions: No Implantable device outside of the clinic excluding No Patient Has Alerts: No cellular tissue based products placed in the center since last visit: Has Dressing in Place as Prescribed: Yes Pain Present Now: No Electronic Signature(s) Signed: 02/11/2021 5:55:37 PM By: Joseph Gouty RN, BSN Entered By: [name] on 02/11/2021 10:20:54 -------------------------------------------------------------------------------- Encounter Discharge Information Details Patient Name: Date of Service: [name], [place]. 02/11/2021 10:30 A M Medical Record Number: [id number] Patient Account Number: [id number] Date of Birth/Sex: Treating RN: Aug 12, 1970 (50 y.o. [name] Primary Care [name]: [name] Other Clinician: Referring [name]: Treating [name]/Extender: [name], [name] in Treatment: 22 Encounter Discharge Information Items Post Procedure Vitals Discharge  Condition: Stable Temperature (F): 97.7 Ambulatory Status: Ambulatory Pulse (bpm): 90 Discharge Destination: Home Respiratory Rate (breaths/min): 18 Transportation: Private Auto Blood Pressure (mmHg): 140/88 Accompanied By: self Schedule Follow-up Appointment: Yes Clinical Summary of Care: Patient Declined Electronic Signature(s) Signed: 02/11/2021 5:55:37 PM By: Joseph Gouty RN, BSN Entered By: [name] on 02/11/2021 11:47:41 -------------------------------------------------------------------------------- Lower Extremity Assessment Details Patient Name: Date of Service: [name], [name] 02/11/2021 10:30 A M Medical Record Number: [id number] Patient Account Number: [id number] Date of Birth/Sex: Treating RN: 03-Apr-1970 (50 y.o. [name] Primary Care [name]: [name] Other Clinician: Referring [name]: Treating [name]/Extender: [name], [name] in Treatment: 22 Edema Assessment Assessed: [Left: No] [Right: No] Edema: [Left: N] [Right: o] Calf Left: Right: Point of Measurement: From Medial Instep 34.3 cm Ankle Left: Right: Point of Measurement: From Medial Instep 23 cm Vascular Assessment Pulses: Dorsalis Pedis Palpable: [Right:Yes] Electronic Signature(s) Signed: 02/11/2021 5:55:37 PM By: Joseph Gouty RN, BSN Entered By: [name] on 02/11/2021 10:25:27 -------------------------------------------------------------------------------- [name] Details Patient Name: Date of Service: [name], [place] [name] 02/11/2021 10:30 A M Medical Record Number: [id number] Patient Account Number: [id number] Date of Birth/Sex: Treating RN: Jul 04, 1970 (50 y.o. [name] Primary Care [name]: [name] Other Clinician: Referring [name]: Treating [name]/Extender: [name], [name] in Treatment: 22 Active Inactive HBO Nursing Diagnoses: Anxiety related to feelings of  confinement associated with the hyperbaric oxygen chamber Anxiety related to knowledge deficit of hyperbaric oxygen therapy and treatment procedures Potential for barotraumas to ears, sinuses, teeth, and lungs or cerebral gas embolism related to changes in atmospheric pressure inside hyperbaric oxygen chamber Potential for oxygen toxicity seizures related to delivery of 100% oxygen at an increased atmospheric pressure Potential for pulmonary oxygen toxicity related to delivery of 100% oxygen at an increased atmospheric pressure Goals: Barotrauma will be prevented during HBO2 Date Initiated: 01/28/2021 Target Resolution Date: 02/25/2021 Goal Status: Active Patient and/or family will be able to  state/discuss factors appropriate to the management of their disease process during treatment Date Initiated: 01/28/2021 Target Resolution Date: 02/25/2021 Goal Status: Active Patient will tolerate the hyperbaric oxygen therapy treatment Date Initiated: 01/28/2021 Target Resolution Date: 02/25/2021 Goal Status: Active Interventions: Administer the correct therapeutic gas delivery based on the patients needs and limitations, per physician order Assess and provide for patients comfort related to the hyperbaric environment and equalization of middle ear Assess for signs and symptoms related to adverse events, including but not limited to confinement anxiety, pneumothorax, oxygen toxicity and baurotrauma Notes: Osteomyelitis Nursing Diagnoses: Infection: osteomyelitis Knowledge deficit related to disease process and management Goals: Patient/caregiver will verbalize understanding of disease process and disease management Date Initiated: 11/26/2020 Target Resolution Date: 02/24/2021 Goal Status: Active Patient's osteomyelitis will resolve Date Initiated: 11/26/2020 Target Resolution Date: 02/24/2021 Goal Status: Active Interventions: Assess for signs and symptoms of osteomyelitis resolution every  visit Provide education on osteomyelitis Screen for HBO Treatment Activities: Systemic antibiotics : 11/26/2020 Notes: Wound/Skin Impairment Nursing Diagnoses: Impaired tissue integrity Goals: Patient/caregiver will verbalize understanding of skin care regimen Date Initiated: 09/05/2020 Target Resolution Date: 02/24/2021 Goal Status: Active Ulcer/skin breakdown will have a volume reduction of 30% by week 4 Date Initiated: 09/05/2020 Date Inactivated: 10/08/2020 Target Resolution Date: 10/03/2020 Goal Status: Met Interventions: Assess patient/caregiver ability to obtain necessary supplies Assess patient/caregiver ability to perform ulcer/skin care regimen upon admission and as needed Assess ulceration(s) every visit Provide education on ulcer and skin care Treatment Activities: Patient referred to home care : 09/05/2020 Skin care regimen initiated : 09/05/2020 Topical wound management initiated : 09/05/2020 Notes: Electronic Signature(s) Signed: 02/11/2021 5:55:37 PM By: Joseph Gouty RN, BSN Entered By: [name] on 02/11/2021 10:43:48 -------------------------------------------------------------------------------- Pain Assessment Details Patient Name: Date of Service: [name], [name] 02/11/2021 10:30 A M Medical Record Number: [id number] Patient Account Number: [id number] Date of Birth/Sex: Treating RN: [birth year] (50 y.o. [name] Primary Care Magin Balbi: [name] Other Clinician: Referring Malyn Aytes: Treating Haylynn Pha/Extender: [name], [name] in Treatment: 22 Active Problems Location of Pain Severity and Description of Pain Patient Has Paino No Site Locations Rate the pain. Current Pain Level: 0 Pain Management and Medication Current Pain Management: Electronic Signature(s) Signed: 02/11/2021 5:55:37 PM By: Joseph Gouty RN, BSN Entered By: [name] on 02/11/2021  10:21:27 -------------------------------------------------------------------------------- Patient/Caregiver Education Details Patient Name: Date of Service: [name], [place] 11/16/2022andnbsp10:30 A M Medical Record Number: [id number] Patient Account Number: [id number] Date of Birth/Gender: Treating RN: 1971/01/05 (50 y.o. [name] Primary Care Physician: [name] Other Clinician: Referring Physician: Treating Physician/Extender: [name], [name] in Treatment: 22 Education Assessment Education Provided To: Patient Education Topics Provided Infection: Methods: Explain/Verbal Responses: Reinforcements needed, State content correctly Wound/Skin Impairment: Methods: Explain/Verbal Responses: Reinforcements needed, State content correctly Electronic Signature(s) Signed: 02/11/2021 5:55:37 PM By: Joseph Gouty RN, BSN Entered By: [name] on 02/11/2021 10:45:25 -------------------------------------------------------------------------------- Wound Assessment Details Patient Name: Date of Service: [name], [name] 02/11/2021 10:30 A M Medical Record Number: [id number] Patient Account Number: [id number] Date of Birth/Sex: Treating RN: 1970/08/21 (50 y.o. [name] Primary Care [place]: [name] Other Clinician: Referring Delancey Moraes: Treating Karo Rog/Extender: [name], [name] in Treatment: 22 Wound Status Wound Number: 3 Primary Diabetic Wound/Ulcer of the Lower Extremity Etiology: Wound Location: Right, Proximal, Lateral Foot Wound Open Wounding Event: Gradually Appeared Status: Date Acquired: 10/29/2020 Comorbid Anemia, Coronary Artery Disease, Hypertension, Type II [name] Of Treatment: 15 History: Diabetes,  Osteomyelitis, Neuropathy Clustered Wound: No Photos Wound Measurements Length: (cm) 0.5 Width: (cm) 0.5 Depth: (cm) 2 Area: (cm) 0.196 Volume: (cm) 0.393 % Reduction in Area:  -55.6% % Reduction in Volume: -346.6% Epithelialization: Small (1-33%) Tunneling: Yes Position (o'clock): 3 Maximum Distance: (cm) 2.5 Undermining: No Wound Description Classification: Grade 3 Wound Margin: Well defined, not attached Exudate Amount: Medium Exudate Type: Serosanguineous Exudate Color: red, brown Foul Odor After Cleansing: No Slough/Fibrino No Wound Bed Granulation Amount: Large (67-100%) Exposed Structure Granulation Quality: Red Fascia Exposed: No Necrotic Amount: None Present (0%) Fat Layer (Subcutaneous Tissue) Exposed: Yes Tendon Exposed: No Muscle Exposed: No Joint Exposed: No Bone Exposed: Yes Treatment Notes Wound #3 (Foot) Wound Laterality: Right, Lateral, Proximal Cleanser Soap and Water Discharge Instruction: May shower and wash wound with dial antibacterial soap and water prior to dressing change. Wound Cleanser Discharge Instruction: Cleanse the wound with wound cleanser prior to applying a clean dressing using gauze sponges, not tissue or cotton balls. Peri-Wound Care Sween Lotion (Moisturizing lotion) Discharge Instruction: Apply moisturizing lotion to foot with dressing changes Topical Primary Dressing VAC Secondary Dressing Secured With Coban Self-Adherent Wrap 4x5 (in/yd) Discharge Instruction: Secure with Coban as directed. Kerlix Roll Sterile, 4.5x3.1 (in/yd) Discharge Instruction: Secure with Kerlix as directed. Compression Wrap Compression Stockings Add-Ons Electronic Signature(s) Signed: 02/11/2021 5:55:37 PM By: Joseph Gouty RN, BSN Entered By: [name] on 02/11/2021 10:37:12 -------------------------------------------------------------------------------- Wound Assessment Details Patient Name: Date of Service: [name], [place] [name] 02/11/2021 10:30 A M Medical Record Number: [id number] Patient Account Number: [id number] Date of Birth/Sex: Treating RN: 09-21-1970 (50 y.o. [name] Primary Care Zahid Carneiro:  [name] Other Clinician: Referring Shamanda Len: Treating Reisha Wos/Extender: [name], [name] in Treatment: 22 Wound Status Wound Number: 7 Primary Diabetic Wound/Ulcer of the Lower Extremity Etiology: Wound Location: Right, Lateral, Plantar Foot Wound Open Wounding Event: Gradually Appeared Status: Date Acquired: 12/31/2020 Comorbid Anemia, Coronary Artery Disease, Hypertension, Type II [name] Of Treatment: 6 History: Diabetes, Osteomyelitis, Neuropathy Clustered Wound: No Photos Wound Measurements Length: (cm) 0.5 Width: (cm) 0.4 Depth: (cm) 1.1 Area: (cm) 0.157 Volume: (cm) 0.173 % Reduction in Area: 19.9% % Reduction in Volume: 11.7% Epithelialization: None Tunneling: Yes Position (o'clock): 12 Maximum Distance: (cm) 1.5 Undermining: No Wound Description Classification: Grade 3 Wound Margin: Well defined, not attached Exudate Amount: Medium Exudate Type: Serosanguineous Exudate Color: red, brown Foul Odor After Cleansing: No Slough/Fibrino No Wound Bed Granulation Amount: Large (67-100%) Exposed Structure Granulation Quality: Red Fascia Exposed: No Necrotic Amount: None Present (0%) Fat Layer (Subcutaneous Tissue) Exposed: Yes Tendon Exposed: No Muscle Exposed: No Joint Exposed: No Bone Exposed: Yes Treatment Notes Wound #7 (Foot) Wound Laterality: Plantar, Right, Lateral Cleanser Soap and Water Discharge Instruction: May shower and wash wound with dial antibacterial soap and water prior to dressing change. Wound Cleanser Discharge Instruction: Cleanse the wound with wound cleanser prior to applying a clean dressing using gauze sponges, not tissue or cotton balls. Peri-Wound Care Sween Lotion (Moisturizing lotion) Discharge Instruction: Apply moisturizing lotion to foot with dressing changes Topical Primary Dressing VAC Secondary Dressing Secured With Coban Self-Adherent Wrap 4x5 (in/yd) Discharge Instruction: Secure with  Coban as directed. Kerlix Roll Sterile, 4.5x3.1 (in/yd) Discharge Instruction: Secure with Kerlix as directed. Compression Wrap Compression Stockings Add-Ons Electronic Signature(s) Signed: 02/11/2021 5:55:37 PM By: Joseph Gouty RN, BSN Entered By: [name] on 02/11/2021 11:04:17 -------------------------------------------------------------------------------- Vitals Details Patient Name: Date of Service: [name], [place] [name] 02/11/2021 10:30 A M Medical Record Number: [id number] Patient  Account Number: [id number] Date of Birth/Sex: Treating RN: 03/16/1971 (50 y.o. [name] Primary Care Narek Kniss: [name] Other Clinician: [name] Referring Asra Gambrel: Treating [place] Sloop/Extender: [name], [name] in Treatment: 22 Vital Signs Time Taken: 10:20 Temperature (F): 97.7 Height (in): 65 Pulse (bpm): 84 Weight (lbs): 196 Respiratory Rate (breaths/min): 16 Body Mass Index (BMI): 32.6 Blood Pressure (mmHg): 134/83 Capillary Blood Glucose (mg/dl): 123 Reference Range: 80 - 120 mg / dl Notes see HBO encounter Electronic Signature(s) Signed: 02/11/2021 71:27:87 PM By: [name] [name] , , Entered By: [name] on 02/11/2021 18:36:72

## 2021-02-11 NOTE — Progress Notes (Addendum)
[name], [name] ([id number]) Visit Report for 02/11/2021 HBO Details Patient Name: Date of Service: [address], [name] 02/11/2021 8:00 A M Medical Record Number: [id number] Patient Account Number: [id number] Date of Birth/Sex: Treating RN: January 28, 1971 (50 y.o. [name], [name] Primary Care Meet [place]: [name] Other Clinician: [name] Referring Charleton Deyoung: Treating Erin Obando/Extender: [name], [name] in Treatment: 22 HBO Treatment Course Details Treatment Course Number: 1 Ordering Cornelis Kluver: [name] Treatments Ordered: otal 40 HBO Treatment Start Date: 12/08/2020 HBO Indication: Diabetic Ulcer(s) of the Lower Extremity HBO Treatment Details Treatment Number: 34 Patient Type: Outpatient Chamber Type: Monoplace Chamber Serial #: [id number] Treatment Protocol: 2.5 ATA with 90 minutes oxygen, with two 5 minute air breaks Treatment Details Compression Rate Down: 2.0 psi / minute De-Compression Rate Up: 2.0 psi / minute A breaks and breathing ir Compress Tx Pressure periods Decompress Decompress Begins Reached (leave unused spaces Begins Ends blank) Chamber Pressure (ATA 1 2.5 2.5 2.5 2.5 2.5 - - 2.5 1 ) Clock Time (24 hr) 08:02 08:13 08:43 08:48 09:18 09:23 - - 09:53 10:04 Treatment Length: 122 (minutes) Treatment Segments: 4 Vital Signs Capillary Blood Glucose Reference Range: 80 - 120 mg / dl HBO Diabetic Blood Glucose Intervention Range: <131 mg/dl or >249 mg/dl Type: Time Vitals Blood Pulse: Respiratory Temperature: Capillary Blood Glucose Pulse Action Taken: Pressure: Rate: Glucose (mg/dl): Meter #: Oximetry (%) Taken: Pre 07:48 140/88 90 16 97.7 233 Post 10:11 134/83 84 16 97.7 123 discharge per protocol >101 mg/dL Treatment Response Treatment Toleration: Well Treatment Completion Status: Treatment Completed without Adverse Event Additional Procedure Documentation Tissue Sevierity: Fat layer exposed Electronic  Signature(s) Signed: 02/11/2021 10:45:17 AM By: [name] [name] , , Signed: 02/11/2021 4:59:49 PM By: Worthy Keeler PA-C Entered By: [name] on 02/11/2021 10:45:14 -------------------------------------------------------------------------------- HBO Safety Checklist Details Patient Name: Date of Service: [name], [place]. 02/11/2021 8:00 A M Medical Record Number: [id number] Patient Account Number: [id number] Date of Birth/Sex: Treating RN: 1970/08/17 (50 y.o. [name], [name] Primary Care Thaddaeus Granja: [name] Other Clinician: [name] Referring Quinterius Gaida: Treating Onisha Cedeno/Extender: [name], [name] in Treatment: 22 HBO Safety Checklist Items Safety Checklist Consent Form Signed Patient voided / foley secured and emptied When did you last eato 0630 Last dose of injectable or oral agent 0630 Ostomy pouch emptied and vented if applicable NA All implantable devices assessed, documented and approved NA Intravenous access site secured and place NA Valuables secured Linens and cotton and cotton/polyester blend (less than 51% polyester) Personal oil-based products / skin lotions / body lotions removed Wigs or hairpieces removed NA Smoking or tobacco materials removed NA Books / newspapers / magazines / loose paper removed Cologne, aftershave, perfume and deodorant removed Jewelry removed (may wrap wedding band) Make-up removed NA Hair care products removed Battery operated devices (external) removed Heating patches and chemical warmers removed Titanium eyewear removed NA Nail polish cured greater than 10 hours NA Casting material cured greater than 10 hours NA Hearing aids removed NA Loose dentures or partials removed NA Prosthetics have been removed NA Patient demonstrates correct use of air break device (if applicable) Patient concerns have been addressed Patient grounding bracelet on and cord attached to  chamber Specifics for Inpatients (complete in addition to above) Medication sheet sent with patient NA Intravenous medications needed or due during therapy sent with patient NA Drainage tubes (e.g. nasogastric tube or chest tube secured and vented) NA Endotracheal or Tracheotomy tube secured  NA Cuff deflated of air and inflated with saline NA Airway suctioned NA Notes Paper version used prior to treatment. Electronic Signature(s) Signed: 02/11/2021 10:40:03 AM By: [name] [name] , , Previous Signature: 02/11/2021 8:48:13 AM Version By: [name] [name] , , Entered By: [name] on 02/11/2021 10:40:02

## 2021-02-11 NOTE — Progress Notes (Addendum)
TALEN, POSER ([id number]) Visit Report for 02/11/2021 Chief Complaint Document Details Patient Name: Date of Service: [place] RD, [name] 02/11/2021 10:30 A M Medical Record Number: [id number] Patient Account Number: [id number] Date of Birth/Sex: Treating RN: [birth year] (50 y.o. [name] Primary Care Provider: [name] Other Clinician: Referring Provider: Treating Provider/Extender: [name], [name] in Treatment: 22 Information Obtained from: Patient Chief Complaint 09/05/2020; patient is here for review of the wound extensively on a right TMA amputation site. He also has a wound on the plantar aspect of the left foot Electronic Signature(s) Signed: 02/11/2021 10:41:29 AM By: Worthy Keeler PA-C Entered By: Worthy Keeler on 02/11/2021 10:41:28 -------------------------------------------------------------------------------- Debridement Details Patient Name: Date of Service: [name], [place] [name] 02/11/2021 10:30 A M Medical Record Number: [id number] Patient Account Number: [id number] Date of Birth/Sex: Treating RN: October 31, 1970 (50 y.o. [name] Primary Care Provider: [name] Other Clinician: Referring Provider: Treating Provider/Extender: [name], [name] in Treatment: 22 Debridement Performed for Assessment: Wound #7 Right,Lateral,Plantar Foot Performed By: Physician Worthy Keeler, PA Debridement Type: Debridement Severity of Tissue Pre Debridement: Fat layer exposed Level of Consciousness (Pre-procedure): Awake and Alert Pre-procedure Verification/Time Out Yes - 11:00 Taken: Start Time: 11:02 Pain Control: Other : benzocaine 20% spray T Area Debrided (L x W): otal 1 (cm) x 0.4 (cm) = 0.4 (cm) Tissue and other material debrided: Non-Viable, Callus, Skin: Epidermis Level: Skin/Epidermis Debridement Description: Selective/Open Wound Instrument: Curette Bleeding: Minimum Hemostasis Achieved: Silver  Nitrate Procedural Pain: 0 Post Procedural Pain: 0 Response to Treatment: Procedure was tolerated well Level of Consciousness (Post- Awake and Alert procedure): Post Debridement Measurements of Total Wound Length: (cm) 0.5 Width: (cm) 0.4 Depth: (cm) 1.1 Volume: (cm) 0.173 Character of Wound/Ulcer Post Debridement: Improved Severity of Tissue Post Debridement: Fat layer exposed Post Procedure Diagnosis Same as Pre-procedure Electronic Signature(s) Signed: 02/11/2021 4:59:49 PM By: Worthy Keeler PA-C Signed: 02/11/2021 5:55:37 PM By: Baruch Gouty RN, BSN Entered By: Baruch Gouty on 02/11/2021 11:12:52 -------------------------------------------------------------------------------- HPI Details Patient Name: Date of Service: [name], [place]. 02/11/2021 10:30 A M Medical Record Number: [id number] Patient Account Number: [id number] Date of Birth/Sex: Treating RN: [birth year] (50 y.o. [name] Primary Care Provider: [name] Other Clinician: Referring Provider: Treating Provider/Extender: [name], [name] in Treatment: 22 History of Present Illness HPI Description: ADMISSION 09/05/2020 This is a 50 year old man who has type 2 diabetes. Essentially the problem began admitted to [facility] health in December with infection gas gangrene. Cultures ultimately grew staph lugdunensis. 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 March Rummage. Shortly thereafter he developed a nonhealing wound with wound dehiscence noted on 05/09/2020. He had a wound VAC I think for a period of time after the surgery but it did not help. He has undergone an IandD and skin graft on the right foot on 05/16/2020. He underwent a further IandD on 06/04/2020 not debrided to bone. 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 Culture of the metatarsal grew Enterobacter cloacae  I. He has been followed by Dr. [name] of infectious disease. Currently on Levaquin and doxycycline I think at about the 4-week of 6 mark. At the last visit with Dr. March Rummage he was supposed to have use Santyl although I do not think he has it and he has been using I think a Betadine wet-to-dry. He  is referred here by Dr. [name] for our review who saw him yesterday. I cannot see that he has had imaging studies of the right foot. No MRI Past medical history includes type 2 diabetes, in 2021 and ulcer of the left foot that ultimately healed with a wound VAC, stage III chronic kidney disease, first toe amputations bilaterally, hypertension, still a 1/2 pack/day smoker. The patient had ABIs on 08/09/2018 which was 1.00 on the right and 0.96 on the left both areas had triphasic waveforms. Not felt to have an arterial issue. 09/12/2020; 1 week follow-up. He has been doing the dressings along with home health. This is a deep punched out area in the incision of his transmit site. We use silver alginate last week to help with drainage and antibacterial properties. He tolerated this well 6/24; the patient was actually at his podiatrist this morning who debrided the wound. We have been using silver alginate. They are going to follow him in 6 weeks. 6/29; patient is using silver alginate. Apparently saw Dr. March Rummage this week although I have not checked his note I will try to do so. He is following up in 6 weeks. This was the original transmetatarsal amputation when I first saw this 3 mid part of his amputation site was deep down to bone however this seems to have progressively close down which is gratifying to see. He still has a fairly callus uneven surface however spreading this apart its difficult to identify anything that does not look epithelialized. When I first saw this I thought he would require an extensive debridement perhaps back in the OR by Dr. March Rummage however all of this appears to be looking better and at this point  I would continue with the silver alginate see if this holds together over the next several weeks. He does not have an arterial issue 7/13; 2-week follow-up. Unfortunately the area did not hold together there was drainage on his dressing. Still copious amounts of callus. It was difficult to see where this actually was open therefore I elected to go ahead with a vigorous debridement. Still using silver alginate 7/20 I brought him back after extensive callus debridement last week. The only open area was on the medial part of the foot extensive debridement of thick surrounding callus and I was able to do identify the remaining tunneling area. 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 This is the area that we may have been following. This is smaller this week but still surrounded with thick callus He came in today with a large area of callus on the left midfoot. This I had noticed previously. HOWEVER our intake nurse clearly incorrectly documented this as opening. The callus and split there was an odor so all of this had to be removed. 10/29/2020 unfortunately upon evaluation today this patient appears to have significant mount of callus noted at this point. There does not appear to be any signs of active infection systemically which is great although locally he is having some discomfort around the lateral portion of his foot on the right. The left foot is no having a lot of callus I am not certain even has an open wound if there is anything is extremely tiny. 11/05/2020 upon evaluation today patient's wounds actually appear to be doing a bit better compared to last week since we have uncovered some of these areas I think there are some definite improvements. With that being  said there is still quite a bit of tunneling and undermining at many locations that again has me concerned I still think that the MRI is the best thing to do he has that  scheduled for Saturday. 11/26/2020 upon evaluation today patient appears to be doing decently well in regard to his foot in general. He did have an MRI I did review this and he had multiple findings on MRI consistent with osteomyelitis across the board in regard pretty much to his remaining metatarsal regions. Nonetheless there was also some evidence of some involvement of the navicular bone as well. Either way I think that this patient actually is in desperate need of get him in for hyperbaric oxygen therapy we discussed that today that will be detailed in the plan. Nonetheless he is also still on the doxycycline which I think is appropriate for the time being as well I did go ahead and place him on that following the results of the MRI based on what we were seeing. I gave him that prescription for 14 days for the time being and we will go from there. 12/03/2020 upon evaluation today patient appears to be doing decently well in regard to his wounds in general in regard to the right foot. With that being said he does have a callus on the left foot in the end there was actually an area open area here that will need to be addressed. This is small and I think we take care of it now hopefully will heal quite readily. Fortunately I do not see any signs of infection worsening I am getting need to refill the antibiotic for him today. He has been taking doxycycline which seems to be doing well. We will also get a go ahead and get him approved for HBO therapy. 12/31/2020 upon evaluation today patient appears to be doing really roughly the same as compared to last time I saw him. Fortunately there is no evidence of active infection which is great news systemically he has started hyperbarics he seems to be doing decently well he said a couple times where anxiety kind of got to him but overall I think he is doing awesome. He is no longer on the antibiotic therapy. With that being said I do believe that he is going to  need likely some additional antibiotics based on what I am seeing today we can actually obtain a bone culture from the plantar wound that was hiding underneath the callus. He was having pain in this area which is what may be try to clear away the callus to look sure enough there was an actual opening that went deeper into the wound bed region here. 01/21/2021 upon evaluation today patient appears to be doing about the same in regard to his foot in general. He does have some depth to some of the wound areas I really think he would benefit from is trying to see about getting him set up for a gauze VAC. I think that the gauze VAC could be helpful for him and will work much more effectively with what we are seeing as compared to traditional foam wound VAC. The patient is in agreement with giving this a trial. Nonetheless unfortunately think that he overall seems to be doing really well and I think we are headed in the right direction as far as the overall healing is concerned. 01/28/2021 upon evaluation today patient appears to be doing well with regard to his wound all things considered he does have a  lot of callus buildup especially on the plantar aspect of the left foot. There is no wound here but a significant amount of callus. That is getting need to be addressed today. Subsequently he also has a lot of callus on the right foot as well as some of the area around the wounds actually being significantly callused. Again I need to clear this away today. 02/04/2021 upon evaluation today patient actually seems to be doing quite well with regard to his foot ulcer. I do believe that the hyperbaric oxygen therapy has been beneficial for him thus far also think that he is doing well with the wound VAC. In combination I think he has an excellent plan at this time as far as treatment is concerned. I do not see any signs of evidence of infection which is great news as well. 02/11/2021 upon evaluation today patient  appears to be doing okay in regard to his foot ulcer though still has deep wounds that do not seem to be doing nearly as good as what I would like to see. I think that the wound VAC is helping but I do believe there still bone in the base of the wound I think we probably need to see about put him back on an antibiotic, do a PCR culture today to see what we find in order to determine whether or not we need to have him on antibiotics which I think we probably do is just a matter which 1. Electronic Signature(s) Signed: 02/11/2021 11:35:13 AM By: Worthy Keeler PA-C Entered By: Worthy Keeler on 02/11/2021 11:35:13 -------------------------------------------------------------------------------- Physical Exam Details Patient Name: Date of Service: [name], [name] 02/11/2021 10:30 A M Medical Record Number: [id number] Patient Account Number: [id number] Date of Birth/Sex: Treating RN: 25-Dec-1970 (50 y.o. [name] Primary Care Provider: [name] Other Clinician: Referring Provider: Treating Provider/Extender: [name], [name] in Treatment: 31 Constitutional Well-nourished and well-hydrated in no acute distress. Respiratory normal breathing without difficulty. Psychiatric this patient is able to make decisions and demonstrates good insight into disease process. Alert and Oriented x 3. pleasant and cooperative. Notes Patient's wound currently showed signs of externally looking okay internally there is still bone exposed in the base of the wound and I am not able to get down to get a sample of the bone but I am going to get a deeper tissue sample to try to help identify through PCR culture what bacteria we may need to treat at this point. The patient is in agreement with that plan. Electronic Signature(s) Signed: 02/11/2021 11:35:40 AM By: Worthy Keeler PA-C Entered By: Worthy Keeler on 02/11/2021  11:35:40 -------------------------------------------------------------------------------- Physician Orders Details Patient Name: Date of Service: [name], [place] [name] 02/11/2021 10:30 A M Medical Record Number: [id number] Patient Account Number: [id number] Date of Birth/Sex: Treating RN: 11/28/1970 (50 y.o. [name] Primary Care Provider: [name] Other Clinician: Referring Provider: Treating Provider/Extender: [name], [name] in Treatment: 22 Verbal / Phone Orders: No Diagnosis Coding ICD-10 Coding Code Description [phone] Other chronic osteomyelitis, right ankle and foot E11.621 Type 2 diabetes mellitus with foot ulcer L97.514 Non-pressure chronic ulcer of other part of right foot with necrosis of bone T81.31XS Disruption of external operation (surgical) wound, not elsewhere classified, sequela F17.218 Nicotine dependence, cigarettes, with other nicotine-induced disorders L97.522 Non-pressure chronic ulcer of other part of left foot with fat layer exposed L84 Corns and callosities Follow-up Appointments ppointment in 1 week. -  with Margarita Grizzle after HBO Return A Nurse Visit: - Fri 11/18 and Mon 11/21 for VAC change after HBO Bathing/ Shower/ Hygiene May shower with protection but do not get wound dressing(s) wet. Negative Presssure Wound Therapy Wound Vac to wound continuously at 111mm/hg pressure Sterile Gauze Packing Edema Control - Lymphedema / SCD / Other Elevate legs to the level of the heart or above for 30 minutes daily and/or when sitting, a frequency of: Avoid standing for long periods of time. Moisturize legs daily. - to foot with dressing changes Off-Loading Open toe surgical shoe to: - Wear at all times except driving. on right foot Other: - may wear shoe with memory foam insert to left foot, cut hole where callous is and insert in shoe to help offload Additional Orders / Instructions Stop/Decrease Smoking Follow Nutritious Diet Non  Wound Condition Other Non Wound Condition Orders/Instructions: - cushion bottom of left foot with orthopedic felt or foam [place] to [place] for wound care. May utilize formulary equivalent dressing for wound treatment orders unless otherwise specified. Dressing changes to be completed by [place] on Monday / Wednesday / Friday except when patient has scheduled visit at [facility]. - VAC changes Mon and Fri Hyperbaric Oxygen Therapy Indication: - wagner grade 3 diabetic foot ulcer of right foot If appropriate for treatment, begin HBOT per protocol: 2.5 ATA for 90 Minutes with 2 Five (5) Minute A Breaks ir Total Number of Treatments: - 40 One treatments per day (delivered Monday through Friday unless otherwise specified in Special Instructions below): Finger stick Blood Glucose Pre- and Post- HBOT Treatment. Follow Hyperbaric Oxygen Glycemia Protocol A frin (Oxymetazoline HCL) 0.05% nasal spray - 1 spray in both nostrils daily as needed prior to HBO treatment for difficulty clearing ears Wound Treatment Wound #3 - Foot Wound Laterality: Right, Lateral, Proximal Cleanser: Soap and Water 3 x Per Week/30 Days Discharge Instructions: May shower and wash wound with dial antibacterial soap and water prior to dressing change. Cleanser: Wound Cleanser 3 x Per Week/30 Days Discharge Instructions: Cleanse the wound with wound cleanser prior to applying a clean dressing using gauze sponges, not tissue or cotton balls. Peri-Wound Care: Sween Lotion (Moisturizing lotion) 3 x Per Week/30 Days Discharge Instructions: Apply moisturizing lotion to foot with dressing changes Prim Dressing: VAC ary 3 x Per Week/30 Days Secured With: Coban Self-Adherent Wrap 4x5 (in/yd) (Generic) 3 x Per Week/30 Days Discharge Instructions: Secure with Coban as directed. Secured With: [employer], 4.5x3.1 (in/yd) (Generic) 3 x Per Week/30 Days Discharge Instructions: Secure with Kerlix as  directed. Wound #7 - Foot Wound Laterality: Plantar, Right, Lateral Cleanser: Soap and Water 3 x Per Week/30 Days Discharge Instructions: May shower and wash wound with dial antibacterial soap and water prior to dressing change. Cleanser: Wound Cleanser 3 x Per Week/30 Days Discharge Instructions: Cleanse the wound with wound cleanser prior to applying a clean dressing using gauze sponges, not tissue or cotton balls. Peri-Wound Care: Sween Lotion (Moisturizing lotion) 3 x Per Week/30 Days Discharge Instructions: Apply moisturizing lotion to foot with dressing changes Prim Dressing: VAC ary 3 x Per Week/30 Days Secured With: Coban Self-Adherent Wrap 4x5 (in/yd) (Generic) 3 x Per Week/30 Days Discharge Instructions: Secure with Coban as directed. Secured With: [employer], 4.5x3.1 (in/yd) (Generic) 3 x Per Week/30 Days Discharge Instructions: Secure with Kerlix as directed. Laboratory naerobe culture (MICRO) - right lateral foot Bacteria identified in Unspecified specimen by A LOINC Code: 161-0 Convenience Name: Anerobic  culture GLYCEMIA INTERVENTIONS PROTOCOL PRE-HBO GLYCEMIA INTERVENTIONS ACTION INTERVENTION Obtain pre-HBO capillary blood glucose (ensure 1 physician order is in chart). A. Notify HBO physician and await physician orders. 2 If result is 70 mg/dl or below: B. If the result meets the hospital definition of a critical result, follow hospital policy. A. Give patient an 8 ounce Glucerna Shake, an 8 ounce Ensure, or 8 ounces of a Glucerna/Ensure equivalent dietary supplement*. B. Wait 30 minutes. If result is 71 mg/dl to 130 mg/dl: C. Retest patients capillary blood glucose (CBG). D. If result greater than or equal to 110 mg/dl, proceed with HBO. If result less than 110 mg/dl, notify HBO physician and consider holding HBO. If result is 131 mg/dl to 249 mg/dl: A. Proceed with HBO. A. Notify HBO physician and await physician orders. B. It is recommended  to hold HBO and do If result is 250 mg/dl or greater: blood/urine ketone testing. C. If the result meets the hospital definition of a critical result, follow hospital policy. POST-HBO GLYCEMIA INTERVENTIONS ACTION INTERVENTION Obtain post HBO capillary blood glucose (ensure 1 physician order is in chart). A. Notify HBO physician and await physician orders. 2 If result is 70 mg/dl or below: B. If the result meets the hospital definition of a critical result, follow hospital policy. A. Give patient an 8 ounce Glucerna Shake, an 8 ounce Ensure, or 8 ounces of a Glucerna/Ensure equivalent dietary supplement*. B. Wait 15 minutes for symptoms of If result is 71 mg/dl to 100 mg/dl: hypoglycemia (i.e. nervousness, anxiety, sweating, chills, clamminess, irritability, confusion, tachycardia or dizziness). C. If patient asymptomatic, discharge patient. If patient symptomatic, repeat capillary blood glucose (CBG) and notify HBO physician. If result is 101 mg/dl to 249 mg/dl: A. Discharge patient. A. Notify HBO physician and await physician orders. B. It is recommended to do blood/urine ketone If result is 250 mg/dl or greater: testing. C. If the result meets the hospital definition of a critical result, follow hospital policy. *Juice or candies are NOT equivalent products. If patient refuses the Glucerna or Ensure, please consult the hospital dietitian for an appropriate substitute. Electronic Signature(s) Signed: 02/11/2021 4:59:49 PM By: Worthy Keeler PA-C Signed: 02/11/2021 5:55:37 PM By: Baruch Gouty RN, BSN Entered By: Baruch Gouty on 02/11/2021 11:17:53 -------------------------------------------------------------------------------- Problem List Details Patient Name: Date of Service: [name], [place] [name] 02/11/2021 10:30 A M Medical Record Number: [id number] Patient Account Number: [id number] Date of Birth/Sex: Treating RN: 11-08-1970 (50 y.o. [name] Primary Care Provider: Minette Brine, [place] Other Clinician: Referring Provider: Treating Provider/Extender: [name], [name] in Treatment: 22 Active Problems ICD-10 Encounter Code Description Active Date MDM Diagnosis [id number] Other chronic osteomyelitis, right ankle and foot 09/05/2020 No Yes E11.621 Type 2 diabetes mellitus with foot ulcer 09/05/2020 No Yes L97.514 Non-pressure chronic ulcer of other part of right foot with necrosis of bone 09/05/2020 No Yes T81.31XS Disruption of external operation (surgical) wound, not elsewhere classified, 09/05/2020 No Yes sequela F17.218 Nicotine dependence, cigarettes, with other nicotine-induced disorders 11/26/2020 No Yes L97.522 Non-pressure chronic ulcer of other part of left foot with fat layer exposed 10/22/2020 No Yes L84 Corns and callosities 01/28/2021 No Yes Inactive Problems Resolved Problems Electronic Signature(s) Signed: 02/11/2021 10:41:01 AM By: Worthy Keeler PA-C Entered By: Worthy Keeler on 02/11/2021 10:40:58 -------------------------------------------------------------------------------- Progress Note Details Patient Name: Date of Service: [name], [name] 02/11/2021 10:30 A M Medical Record Number: [id number] Patient Account Number: [id number] Date of Birth/Sex: Treating RN: 1970-08-16 (50  y.o. [name] Primary Care Provider: [name] Other Clinician: Referring Provider: Treating Provider/Extender: [name], [name] in Treatment: 22 Subjective Chief Complaint Information obtained from Patient 09/05/2020; patient is here for review of the wound extensively on a right TMA amputation site. He also has a wound on the plantar aspect of the left foot History of Present Illness (HPI) ADMISSION 09/05/2020 This is a 50 year old man who has type 2 diabetes. Essentially the problem began admitted to [facility] health in December with infection gas gangrene. Cultures ultimately  grew staph lugdunensis. 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 March Rummage. Shortly thereafter he developed a nonhealing wound with wound dehiscence noted on 05/09/2020. He had a wound VAC I think for a period of time after the surgery but it did not help. He has undergone an IandD and skin graft on the right foot on 05/16/2020. He underwent a further IandD on 06/04/2020 not debrided to bone. 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 Culture of the metatarsal grew Enterobacter cloacae I. He has been followed by Dr. [name] of infectious disease. Currently on Levaquin and doxycycline I think at about the 4-week of 6 mark. At the last visit with Dr. March Rummage he was supposed to have use Santyl although I do not think he has it and he has been using I think a Betadine wet-to-dry. He is referred here by Dr. [name] for our review who saw him yesterday. I cannot see that he has had imaging studies of the right foot. No MRI Past medical history includes type 2 diabetes, in 2021 and ulcer of the left foot that ultimately healed with a wound VAC, stage III chronic kidney disease, first toe amputations bilaterally, hypertension, still a 1/2 pack/day smoker. The patient had ABIs on 08/09/2018 which was 1.00 on the right and 0.96 on the left both areas had triphasic waveforms. Not felt to have an arterial issue. 09/12/2020; 1 week follow-up. He has been doing the dressings along with home health. This is a deep punched out area in the incision of his transmit site. We use silver alginate last week to help with drainage and antibacterial properties. He tolerated this well 6/24; the patient was actually at his podiatrist this morning who debrided the wound. We have been using silver alginate. They are going to follow him in 6 weeks. 6/29; patient is using silver alginate. Apparently saw Dr. March Rummage this week although I  have not checked his note I will try to do so. He is following up in 6 weeks. This was the original transmetatarsal amputation when I first saw this 3 mid part of his amputation site was deep down to bone however this seems to have progressively close down which is gratifying to see. He still has a fairly callus uneven surface however spreading this apart its difficult to identify anything that does not look epithelialized. When I first saw this I thought he would require an extensive debridement perhaps back in the OR by Dr. March Rummage however all of this appears to be looking better and at this point I would continue with the silver alginate see if this holds together over the next several weeks. He does not have an arterial issue 7/13; 2-week follow-up. Unfortunately the area did not hold together there was drainage on his dressing. Still copious amounts of callus. It was difficult to see where this actually was open  therefore I elected to go ahead with a vigorous debridement. Still using silver alginate 7/20 I brought him back after extensive callus debridement last week. The only open area was on the medial part of the foot extensive debridement of thick surrounding callus and I was able to do identify the remaining tunneling area. 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 This is the area that we may have been following. This is smaller this week but still surrounded with thick callus He came in today with a large area of callus on the left midfoot. This I had noticed previously. HOWEVER our intake nurse clearly incorrectly documented this as opening. The callus and split there was an odor so all of this had to be removed. 10/29/2020 unfortunately upon evaluation today this patient appears to have significant mount of callus noted at this point. There does not appear to be any signs of active infection systemically which is great although  locally he is having some discomfort around the lateral portion of his foot on the right. The left foot is no having a lot of callus I am not certain even has an open wound if there is anything is extremely tiny. 11/05/2020 upon evaluation today patient's wounds actually appear to be doing a bit better compared to last week since we have uncovered some of these areas I think there are some definite improvements. With that being said there is still quite a bit of tunneling and undermining at many locations that again has me concerned I still think that the MRI is the best thing to do he has that scheduled for Saturday. 11/26/2020 upon evaluation today patient appears to be doing decently well in regard to his foot in general. He did have an MRI I did review this and he had multiple findings on MRI consistent with osteomyelitis across the board in regard pretty much to his remaining metatarsal regions. Nonetheless there was also some evidence of some involvement of the navicular bone as well. Either way I think that this patient actually is in desperate need of get him in for hyperbaric oxygen therapy we discussed that today that will be detailed in the plan. Nonetheless he is also still on the doxycycline which I think is appropriate for the time being as well I did go ahead and place him on that following the results of the MRI based on what we were seeing. I gave him that prescription for 14 days for the time being and we will go from there. 12/03/2020 upon evaluation today patient appears to be doing decently well in regard to his wounds in general in regard to the right foot. With that being said he does have a callus on the left foot in the end there was actually an area open area here that will need to be addressed. This is small and I think we take care of it now hopefully will heal quite readily. Fortunately I do not see any signs of infection worsening I am getting need to refill the antibiotic for  him today. He has been taking doxycycline which seems to be doing well. We will also get a go ahead and get him approved for HBO therapy. 12/31/2020 upon evaluation today patient appears to be doing really roughly the same as compared to last time I saw him. Fortunately there is no evidence of active infection which is great news systemically he has started hyperbarics he  seems to be doing decently well he said a couple times where anxiety kind of got to him but overall I think he is doing awesome. He is no longer on the antibiotic therapy. With that being said I do believe that he is going to need likely some additional antibiotics based on what I am seeing today we can actually obtain a bone culture from the plantar wound that was hiding underneath the callus. He was having pain in this area which is what may be try to clear away the callus to look sure enough there was an actual opening that went deeper into the wound bed region here. 01/21/2021 upon evaluation today patient appears to be doing about the same in regard to his foot in general. He does have some depth to some of the wound areas I really think he would benefit from is trying to see about getting him set up for a gauze VAC. I think that the gauze VAC could be helpful for him and will work much more effectively with what we are seeing as compared to traditional foam wound VAC. The patient is in agreement with giving this a trial. Nonetheless unfortunately think that he overall seems to be doing really well and I think we are headed in the right direction as far as the overall healing is concerned. 01/28/2021 upon evaluation today patient appears to be doing well with regard to his wound all things considered he does have a lot of callus buildup especially on the plantar aspect of the left foot. There is no wound here but a significant amount of callus. That is getting need to be addressed today. Subsequently he also has a lot of callus on  the right foot as well as some of the area around the wounds actually being significantly callused. Again I need to clear this away today. 02/04/2021 upon evaluation today patient actually seems to be doing quite well with regard to his foot ulcer. I do believe that the hyperbaric oxygen therapy has been beneficial for him thus far also think that he is doing well with the wound VAC. In combination I think he has an excellent plan at this time as far as treatment is concerned. I do not see any signs of evidence of infection which is great news as well. 02/11/2021 upon evaluation today patient appears to be doing okay in regard to his foot ulcer though still has deep wounds that do not seem to be doing nearly as good as what I would like to see. I think that the wound VAC is helping but I do believe there still bone in the base of the wound I think we probably need to see about put him back on an antibiotic, do a PCR culture today to see what we find in order to determine whether or not we need to have him on antibiotics which I think we probably do is just a matter which 1. Objective Constitutional Well-nourished and well-hydrated in no acute distress. Vitals Time Taken: 10:20 AM, Height: 65 in, Weight: 196 lbs, BMI: 32.6. General Notes: see HBO encounter Respiratory normal breathing without difficulty. Psychiatric this patient is able to make decisions and demonstrates good insight into disease process. Alert and Oriented x 3. pleasant and cooperative. General Notes: Patient's wound currently showed signs of externally looking okay internally there is still bone exposed in the base of the wound and I am not able to get down to get a sample of the bone but  I am going to get a deeper tissue sample to try to help identify through PCR culture what bacteria we may need to treat at this point. The patient is in agreement with that plan. Integumentary (Hair, Skin) Wound #3 status is Open. Original  cause of wound was Gradually Appeared. The date acquired was: 10/29/2020. The wound has been in treatment 15 weeks. The wound is located on the Right,Proximal,Lateral Foot. The wound measures 0.5cm length x 0.5cm width x 2cm depth; 0.196cm^2 area and 0.393cm^3 volume. There is bone and Fat Layer (Subcutaneous Tissue) exposed. There is no undermining noted, however, there is tunneling at 3:00 with a maximum distance of 2.5cm. There is a medium amount of serosanguineous drainage noted. The wound margin is well defined and not attached to the wound base. There is large (67- 100%) red granulation within the wound bed. There is no necrotic tissue within the wound bed. Wound #7 status is Open. Original cause of wound was Gradually Appeared. The date acquired was: 12/31/2020. The wound has been in treatment 6 weeks. The wound is located on the [place]. The wound measures 0.5cm length x 0.4cm width x 1.1cm depth; 0.157cm^2 area and 0.173cm^3 volume. There is bone and Fat Layer (Subcutaneous Tissue) exposed. There is no undermining noted, however, there is tunneling at 12:00 with a maximum distance of 1.5cm. There is a medium amount of serosanguineous drainage noted. The wound margin is well defined and not attached to the wound base. There is large (67- 100%) red granulation within the wound bed. There is no necrotic tissue within the wound bed. Assessment Active Problems ICD-10 Other chronic osteomyelitis, right ankle and foot Type 2 diabetes mellitus with foot ulcer Non-pressure chronic ulcer of other part of right foot with necrosis of bone Disruption of external operation (surgical) wound, not elsewhere classified, sequela Nicotine dependence, cigarettes, with other nicotine-induced disorders Non-pressure chronic ulcer of other part of left foot with fat layer exposed Corns and callosities Procedures Wound #7 Pre-procedure diagnosis of Wound #7 is a Diabetic Wound/Ulcer of the Lower  Extremity located on the Right,Lateral,Plantar Foot .Severity of Tissue Pre Debridement is: Fat layer exposed. There was a Selective/Open Wound Skin/Epidermis Debridement with a total area of 0.4 sq cm performed by Worthy Keeler, PA. With the following instrument(s): Curette to remove Non-Viable tissue/material. Material removed includes Callus and Skin: Epidermis and after achieving pain control using Other (benzocaine 20% spray). No specimens were taken. A time out was conducted at 11:00, prior to the start of the procedure. A Minimum amount of bleeding was controlled with Silver Nitrate. The procedure was tolerated well with a pain level of 0 throughout and a pain level of 0 following the procedure. Post Debridement Measurements: 0.5cm length x 0.4cm width x 1.1cm depth; 0.173cm^3 volume. Character of Wound/Ulcer Post Debridement is improved. Severity of Tissue Post Debridement is: Fat layer exposed. Post procedure Diagnosis Wound #7: Same as Pre-Procedure Plan Follow-up Appointments: Return Appointment in 1 week. - with Margarita Grizzle after HBO Nurse Visit: - Ludwig Clarks 11/18 and Mon 11/21 for VAC change after HBO Bathing/ Shower/ Hygiene: May shower with protection but do not get wound dressing(s) wet. Negative Presssure Wound Therapy: Wound Vac to wound continuously at 148mm/hg pressure Sterile Gauze Packing Edema Control - Lymphedema / SCD / Other: Elevate legs to the level of the heart or above for 30 minutes daily and/or when sitting, a frequency of: Avoid standing for long periods of time. Moisturize legs daily. - to foot with dressing changes  Off-Loading: Open toe surgical shoe to: - Wear at all times except driving. on right foot Other: - may wear shoe with memory foam insert to left foot, cut hole where callous is and insert in shoe to help offload Additional Orders / Instructions: Stop/Decrease Smoking Follow Nutritious Diet Non Wound Condition: Other Non Wound Condition  Orders/Instructions: - cushion bottom of left foot with orthopedic felt or foam Home Health: Admit to [facility] for wound care. May utilize formulary equivalent dressing for wound treatment orders unless otherwise specified. Dressing changes to be completed by [facility] on Monday / Wednesday / Friday except when patient has scheduled visit at [facility]. - VAC changes Mon and Fri Hyperbaric Oxygen Therapy: Indication: - wagner grade 3 diabetic foot ulcer of right foot If appropriate for treatment, begin HBOT per protocol: 2.5 ATA for 90 Minutes with 2 Five (5) Minute Air Breaks T Number of Treatments: - 40 otal One treatments per day (delivered Monday through Friday unless otherwise specified in Special Instructions below): Finger stick Blood Glucose Pre- and Post- HBOT Treatment. Follow Hyperbaric Oxygen Glycemia Protocol Afrin (Oxymetazoline HCL) 0.05% nasal spray - 1 spray in both nostrils daily as needed prior to HBO treatment for difficulty clearing ears Laboratory ordered were: PCR culture - right lateral foot WOUND #3: - Foot Wound Laterality: Right, Lateral, Proximal Cleanser: Soap and Water 3 x Per Week/30 Days Discharge Instructions: May shower and wash wound with dial antibacterial soap and water prior to dressing change. Cleanser: Wound Cleanser 3 x Per Week/30 Days Discharge Instructions: Cleanse the wound with wound cleanser prior to applying a clean dressing using gauze sponges, not tissue or cotton balls. Peri-Wound Care: Sween Lotion (Moisturizing lotion) 3 x Per Week/30 Days Discharge Instructions: Apply moisturizing lotion to foot with dressing changes Prim Dressing: VAC 3 x Per Week/30 Days ary Secured With: Coban Self-Adherent Wrap 4x5 (in/yd) (Generic) 3 x Per Week/30 Days Discharge Instructions: Secure with Coban as directed. Secured With: [employer], 4.5x3.1 (in/yd) (Generic) 3 x Per Week/30 Days Discharge Instructions: Secure with Kerlix as  directed. WOUND #7: - Foot Wound Laterality: Plantar, Right, Lateral Cleanser: Soap and Water 3 x Per Week/30 Days Discharge Instructions: May shower and wash wound with dial antibacterial soap and water prior to dressing change. Cleanser: Wound Cleanser 3 x Per Week/30 Days Discharge Instructions: Cleanse the wound with wound cleanser prior to applying a clean dressing using gauze sponges, not tissue or cotton balls. Peri-Wound Care: Sween Lotion (Moisturizing lotion) 3 x Per Week/30 Days Discharge Instructions: Apply moisturizing lotion to foot with dressing changes Prim Dressing: VAC 3 x Per Week/30 Days ary Secured With: Coban Self-Adherent Wrap 4x5 (in/yd) (Generic) 3 x Per Week/30 Days Discharge Instructions: Secure with Coban as directed. Secured With: [employer], 4.5x3.1 (in/yd) (Generic) 3 x Per Week/30 Days Discharge Instructions: Secure with Kerlix as directed. 1. Would recommend that we going to continue with the wound care measures as before and the patient is in agreement with the plan. This includes the wound VAC which I think is still the best option for [place] right now. 2. I am also can recommend that we continue with the dressing changes 3 times per week which she is tolerating quite well. 3. I am also going to go and send in the PCR culture depend on the results of this I will initiate antibiotic therapy as directed. His last culture that I did showed no growth. That was in October 2022. We will see  patient back for reevaluation in 1 week here in the clinic. If anything worsens or changes patient will contact our office for additional recommendations. Electronic Signature(s) Signed: 02/11/2021 11:36:24 AM By: Worthy Keeler PA-C Entered By: Worthy Keeler on 02/11/2021 11:36:24 -------------------------------------------------------------------------------- SuperBill Details Patient Name: Date of Service: [name], [name] 02/11/2021 Medical Record Number:  [id number] Patient Account Number: [id number] Date of Birth/Sex: Treating RN: Jun 27, 1970 (50 y.o. [name] Primary Care Provider: [name] Other Clinician: Referring Provider: Treating Provider/Extender: [name], [name] in Treatment: 22 Diagnosis Coding ICD-10 Codes Code Description [id number] Other chronic osteomyelitis, right ankle and foot E11.621 Type 2 diabetes mellitus with foot ulcer L97.514 Non-pressure chronic ulcer of other part of right foot with necrosis of bone T81.31XS Disruption of external operation (surgical) wound, not elsewhere classified, sequela F17.218 Nicotine dependence, cigarettes, with other nicotine-induced disorders L97.522 Non-pressure chronic ulcer of other part of left foot with fat layer exposed L84 Corns and callosities Facility Procedures CPT4 Code: [id number] Description: [phone] - DEBRIDE WOUND 1ST 20 SQ CM OR < ICD-10 Diagnosis Description L97.514 Non-pressure chronic ulcer of other part of right foot with necrosis of bone Modifier: Quantity: 1 Physician Procedures : CPT4 Code Description Modifier [id number] - WC PHYS LEVEL 4 - EST PT 25 ICD-10 Diagnosis Description M86.671 Other chronic osteomyelitis, right ankle and foot E11.621 Type 2 diabetes mellitus with foot ulcer L97.514 Non-pressure chronic ulcer of  other part of right foot with necrosis of bone T81.31XS Disruption of external operation (surgical) wound, not elsewhere classified, sequela Quantity: 1 : [id number] - WC PHYS DEBR WO ANESTH 20 SQ CM ICD-10 Diagnosis Description L97.514 Non-pressure chronic ulcer of other part of right foot with necrosis of bone Quantity: 1 Electronic Signature(s) Signed: 02/11/2021 11:36:42 AM By: Worthy Keeler PA-C Entered By: Worthy Keeler on 02/11/2021 11:36:41

## 2021-02-11 NOTE — Progress Notes (Addendum)
TOMY, KHIM ([id number]) Visit Report for 02/11/2021 Arrival Information Details Patient Name: Date of Service: [place] RD, [name] 02/11/2021 8:00 A M Medical Record Number: [id number] Patient Account Number: [id number] Date of Birth/Sex: Treating RN: [birth year] (50 y.o. [name], Meta.Reding Primary Care Thandiwe Sirag[place]: [name] Other Clinician: [name] Referring Adom Schoeneck: Treating Alahna Dunne/Extender: [name], [name] in Treatment: 22 Visit Information History Since Last Visit All ordered tests and consults were completed: Yes Patient Arrived: Cane Added or deleted any medications: No Arrival Time: 07:48 Any new allergies or adverse reactions: No Accompanied By: self Had a fall or experienced change in No Transfer Assistance: None activities of daily living that may affect Patient Identification Verified: Yes risk of falls: Secondary Verification Process Completed: Yes Signs or symptoms of abuse/neglect since last visito No Patient Requires Transmission-Based Precautions: No Hospitalized since last visit: No Patient Has Alerts: No Implantable device outside of the clinic excluding No cellular tissue based products placed in the center since last visit: Pain Present Now: No Electronic Signature(s) Signed: 02/11/2021 8:40:25 AM By: [name] [name] , , Entered By: [name] on 02/11/2021 08:40:24 -------------------------------------------------------------------------------- Encounter Discharge Information Details Patient Name: Date of Service: [name], [place]. 02/11/2021 8:00 A M Medical Record Number: [id number] Patient Account Number: [id number] Date of Birth/Sex: Treating RN: Mar 15, 1971 (50 y.o. [name] Primary Care Taneisha Fuson: [name] Other Clinician: [name] Referring Mallie Linnemann: Treating Curtistine Pettitt/Extender: [name], [name] in Treatment: 22 Encounter Discharge  Information Items Discharge Condition: Stable Ambulatory Status: Cane Discharge Destination: Other (Note Required) Transportation: Other Accompanied By: staff Schedule Follow-up Appointment: No Clinical Summary of Care: Notes Wound care encounter after treatment. Electronic Signature(s) Signed: 02/16/2021 10:05:09 AM By: [name] [name] , , Entered By: [name] on 02/11/2021 10:48:20 -------------------------------------------------------------------------------- Vitals Details Patient Name: Date of Service: [name], [name] 02/11/2021 8:00 A M Medical Record Number: [id number] Patient Account Number: [id number] Date of Birth/Sex: Treating RN: 1970/11/13 (50 y.o. [name], [name] Primary Care Vraj Denardo: [name] Other Clinician: [name] Referring Duane Earnshaw: Treating Endora Teresi/Extender: [name], [name] in Treatment: 22 Vital Signs Time Taken: 07:48 Temperature (F): 97.7 Height (in): 65 Pulse (bpm): 90 Weight (lbs): 196 Respiratory Rate (breaths/min): 16 Body Mass Index (BMI): 32.6 Blood Pressure (mmHg): 140/88 Capillary Blood Glucose (mg/dl): 233 Reference Range: 80 - 120 mg / dl Electronic Signature(s) Signed: 02/11/2021 8:46:59 AM By: [name] [name] , , Entered By: [name] on 02/11/2021 08:46:59

## 2021-02-12 ENCOUNTER — Encounter (HOSPITAL_BASED_OUTPATIENT_CLINIC_OR_DEPARTMENT_OTHER): Payer: 59 | Admitting: Internal Medicine

## 2021-02-12 DIAGNOSIS — E11621 Type 2 diabetes mellitus with foot ulcer: Secondary | ICD-10-CM | POA: Diagnosis not present

## 2021-02-12 LAB — GLUCOSE, CAPILLARY
Glucose-Capillary: 151 mg/dL — ABNORMAL HIGH (ref 70–99)
Glucose-Capillary: 117 mg/dL — ABNORMAL HIGH (ref 70–99)

## 2021-02-12 NOTE — Progress Notes (Signed)
[name], [name] ([id number]) Visit Report for 02/04/2021 SuperBill Details Patient Name: Date of Service: [place] RD, [name] 02/04/2021 Medical Record Number: [id number] Patient Account Number: [id number] Date of Birth/Sex: Treating RN: June 09, 1970 (50 y.o. [name] Primary Care Provider: [name] Other Clinician: [name] Referring Provider: Treating Provider/Extender: [name], [name] in Treatment: 21 Diagnosis Coding ICD-10 Codes Code Description [phone] Other chronic osteomyelitis, right ankle and foot E11.621 Type 2 diabetes mellitus with foot ulcer L97.514 Non-pressure chronic ulcer of other part of right foot with necrosis of bone T81.31XS Disruption of external operation (surgical) wound, not elsewhere classified, sequela F17.218 Nicotine dependence, cigarettes, with other nicotine-induced disorders L97.522 Non-pressure chronic ulcer of other part of left foot with fat layer exposed L84 Corns and callosities Facility Procedures CPT4 Code Description Modifier Quantity [id number]-(Facility Use Only) HBOT full body chamber, 65min , 4 ICD-10 Diagnosis Description M86.671 Other chronic osteomyelitis, right ankle and foot E11.621 Type 2 diabetes mellitus with foot ulcer L97.514 Non-pressure chronic ulcer of other part of right foot with necrosis of bone Physician Procedures Quantity CPT4 Code Description Modifier [id number] - WC PHYS HYPERBARIC OXYGEN THERAPY 1 ICD-10 Diagnosis Description M86.671 Other chronic osteomyelitis, right ankle and foot E11.621 Type 2 diabetes mellitus with foot ulcer L97.514 Non-pressure chronic ulcer of other part of right foot with necrosis of bone Electronic Signature(s) Signed: 02/04/2021 11:05:29 AM By: [name] [name] , , Signed: 02/12/2021 5:50:22 PM By: Worthy Keeler PA-C Entered By: [name] on 02/04/2021 11:05:26

## 2021-02-12 NOTE — Progress Notes (Addendum)
DEANO, TOMASZEWSKI ([id number]) Visit Report for 02/12/2021 HBO Details Patient Name: Date of Service: [address], [name] 02/12/2021 8:00 A M Medical Record Number: [id number] Patient Account Number: [id number] Date of Birth/Sex: Treating RN: 1970-12-27 (50 y.o. [name], [name] Primary Care Makai Agostinelli: [name] Other Clinician: [name] Referring Hennessey Cantrell: Treating Raeqwon Lux/Extender: [name], [name] in Treatment: 22 HBO Treatment Course Details Treatment Course Number: 1 Ordering Leeza Heiner: [name] Treatments Ordered: otal 40 HBO Treatment Start Date: 12/08/2020 HBO Indication: Diabetic Ulcer(s) of the Lower Extremity HBO Treatment Details Treatment Number: 35 Patient Type: Outpatient Chamber Type: Monoplace Chamber Serial #: [id number] Treatment Protocol: 2.5 ATA with 90 minutes oxygen, with two 5 minute air breaks Treatment Details Compression Rate Down: 2.0 psi / minute De-Compression Rate Up: 2.0 psi / minute A breaks and breathing ir Compress Tx Pressure periods Decompress Decompress Begins Reached (leave unused spaces Begins Ends blank) Chamber Pressure (ATA 1 2.5 2.5 2.5 2.5 2.5 - - 2.5 1 ) Clock Time (24 hr) 08:09 08:20 08:50 08:55 09:25 09:30 - - 10:00 10:10 Treatment Length: 121 (minutes) Treatment Segments: 4 Vital Signs Capillary Blood Glucose Reference Range: 80 - 120 mg / dl HBO Diabetic Blood Glucose Intervention Range: <131 mg/dl or >249 mg/dl Time Vitals Blood Respiratory Capillary Blood Glucose Pulse Action Type: Pulse: Temperature: Taken: Pressure: Rate: Glucose (mg/dl): Meter #: Oximetry (%) Taken: Pre 08:00 150/91 100 18 97.9 151 2 Post 10:16 159/100 90 18 97.7 117 2 100 Treatment Response Treatment Toleration: Well Treatment Completion Status: Treatment Completed without Adverse Event Additional Procedure Documentation Tissue Sevierity: Fat layer exposed Deshayla Empson Notes No concerns with treatment  given Physician HBO Attestation: I certify that I supervised this HBO treatment in accordance with Medicare guidelines. A trained emergency response team is readily available per Yes hospital policies and procedures. Continue HBOT as ordered. Yes Electronic Signature(s) Signed: 02/12/2021 5:16:34 PM By: [name] MD Previous Signature: 02/12/2021 11:23:31 AM Version By: [name] [name] , , Entered By: [name] on 02/12/2021 17:13:36 -------------------------------------------------------------------------------- HBO Safety Checklist Details Patient Name: Date of Service: [name], [name] 02/12/2021 8:00 A M Medical Record Number: [id number] Patient Account Number: [id number] Date of Birth/Sex: Treating RN: 02/16/1971 (50 y.o. [name], [name] Primary Care Arryanna Holquin: [name] Other Clinician: [name] Referring Sharlot Sturkey: Treating Clarice Zulauf/Extender: [name], [name] in Treatment: 22 HBO Safety Checklist Items Safety Checklist Consent Form Signed Patient voided / foley secured and emptied When did you last eato 0630 Last dose of injectable or oral agent 0630 Ostomy pouch emptied and vented if applicable NA All implantable devices assessed, documented and approved NA Intravenous access site secured and place NA Valuables secured Linens and cotton and cotton/polyester blend (less than 51% polyester) Personal oil-based products / skin lotions / body lotions removed Wigs or hairpieces removed NA Smoking or tobacco materials removed Books / newspapers / magazines / loose paper removed Cologne, aftershave, perfume and deodorant removed Jewelry removed (may wrap wedding band) Make-up removed NA Hair care products removed Battery operated devices (external) removed Heating patches and chemical warmers removed Titanium eyewear removed NA Nail polish cured greater than 10 hours NA Casting material cured greater  than 10 hours NA Hearing aids removed NA Loose dentures or partials removed NA Prosthetics have been removed NA Patient demonstrates correct use of air break device (if applicable) Patient concerns have been addressed Patient grounding bracelet on and cord attached to chamber Specifics for Inpatients (complete  in addition to above) Medication sheet sent with patient NA Intravenous medications needed or due during therapy sent with patient NA Drainage tubes (e.g. nasogastric tube or chest tube secured and vented) NA Endotracheal or Tracheotomy tube secured NA Cuff deflated of air and inflated with saline NA Airway suctioned NA Notes Paper version used prior to treatment. Electronic Signature(s) Signed: 02/12/2021 9:08:24 AM By: [name] [name] , , Previous Signature: 02/12/2021 8:19:05 AM Version By: [name] [name] , , Entered By: [name] on 02/12/2021 93:23:55

## 2021-02-12 NOTE — Progress Notes (Signed)
[name], [name] ([id number]) Visit Report for 02/12/2021 SuperBill Details Patient Name: Date of Service: [address], [name] 02/12/2021 Medical Record Number: [id number] Patient Account Number: [id number] Date of Birth/Sex: Treating RN: 01-02-1971 (50 y.o. [name], [name] Primary Care Provider: [name] Other Clinician: [name] Referring Provider: Treating Provider/Extender: [name], [name] in Treatment: 22 Diagnosis Coding ICD-10 Codes Code Description [phone] Other chronic osteomyelitis, right ankle and foot E11.621 Type 2 diabetes mellitus with foot ulcer L97.514 Non-pressure chronic ulcer of other part of right foot with necrosis of bone T81.31XS Disruption of external operation (surgical) wound, not elsewhere classified, sequela F17.218 Nicotine dependence, cigarettes, with other nicotine-induced disorders L97.522 Non-pressure chronic ulcer of other part of left foot with fat layer exposed L84 Corns and callosities Facility Procedures CPT4 Code Description Modifier Quantity [id number] G0277-(Facility Use Only) HBOT full body chamber, 72min , 4 ICD-10 Diagnosis Description M86.671 Other chronic osteomyelitis, right ankle and foot E11.621 Type 2 diabetes mellitus with foot ulcer Physician Procedures Quantity CPT4 Code Description Modifier [id number] 94801 - WC PHYS HYPERBARIC OXYGEN THERAPY 1 ICD-10 Diagnosis Description M86.671 Other chronic osteomyelitis, right ankle and foot E11.621 Type 2 diabetes mellitus with foot ulcer Electronic Signature(s) Signed: 02/12/2021 11:23:53 AM By: [name] [name] , , Signed: 02/12/2021 5:16:34 PM By: [name] MD Entered By: [name] on 02/12/2021 11:23:52

## 2021-02-12 NOTE — Progress Notes (Addendum)
[name], [name] ([id number]) Visit Report for 02/12/2021 Arrival Information Details Patient Name: Date of Service: [name], [name] 02/12/2021 8:00 A M Medical Record Number: [id number] Patient Account Number: [id number] Date of Birth/Sex: Treating RN: 09/05/1970 (51 y.o. [name] Primary Care [name]: [name] Other Clinician: Donavan [name] Referring [name]: Treating [name]/Extender: [name], [name] in Treatment: 66 Visit Information History Since Last Visit All ordered tests and consults were completed: Yes Patient Arrived: [name] Added or deleted any medications: No Arrival Time: 07:40 Any new allergies or adverse reactions: No Accompanied By: self Had a fall or experienced change in No Transfer Assistance: None activities of daily living that may affect Patient Identification Verified: Yes risk of falls: Secondary Verification Process Completed: Yes Signs or symptoms of abuse/neglect since last visito No Patient Requires Transmission-Based Precautions: No Hospitalized since last visit: No Patient Has Alerts: No Implantable device outside of the clinic excluding No cellular tissue based products placed in the center since last visit: Pain Present Now: No Electronic Signature(s) Signed: 02/12/2021 8:14:27 AM By: [name] [name] , , Entered By: [name] on 02/12/2021 08:14:27 -------------------------------------------------------------------------------- Encounter Discharge Information Details Patient Name: Date of Service: [name], [place]. 02/12/2021 8:00 A M Medical Record Number: [id number] Patient Account Number: [id number] Date of Birth/Sex: Treating RN: [birth year] (50 y.o. [name] Primary Care Illona Bulman: [name] Other Clinician: Donavan [name] Referring [name]: Treating [name]/Extender: [name], [name] in Treatment: 22 Encounter Discharge  Information Items Discharge Condition: Stable Ambulatory Status: Cane Discharge Destination: Home Transportation: Private Auto Accompanied By: self Schedule Follow-up Appointment: No Clinical Summary of Care: Electronic Signature(s) Signed: 02/12/2021 11:25:03 AM By: [name] [name] , , Entered By: [name] on 02/12/2021 11:25:02 -------------------------------------------------------------------------------- [place] Details Patient Name: Date of Service: Joseph Hahn RD, [name] 02/12/2021 8:00 A M Medical Record Number: [id number] Patient Account Number: [id number] Date of Birth/Sex: Treating RN: 08/09/1970 (50 y.o. [name], [name] Primary Care [name]: [name] Other Clinician: Donavan [name] Referring [name]: Treating [name]/Extender: [name], [name] in Treatment: 22 Vital Signs Time Taken: 08:00 Temperature (F): 97.9 Height (in): 65 Pulse (bpm): 100 Weight (lbs): 196 Respiratory Rate (breaths/min): 18 Body Mass Index (BMI): 32.6 Blood Pressure (mmHg): 150/91 Capillary Blood Glucose (mg/dl): 151 Reference Range: 80 - 120 mg / dl Electronic Signature(s) Signed: 02/16/2021 10:05:09 AM By: [name] [name] , , Entered By: [name] on 02/12/2021 08:15:39

## 2021-02-13 ENCOUNTER — Encounter (HOSPITAL_BASED_OUTPATIENT_CLINIC_OR_DEPARTMENT_OTHER): Payer: 59 | Admitting: Internal Medicine

## 2021-02-13 ENCOUNTER — Other Ambulatory Visit: Payer: Self-pay

## 2021-02-13 DIAGNOSIS — E11621 Type 2 diabetes mellitus with foot ulcer: Secondary | ICD-10-CM | POA: Diagnosis not present

## 2021-02-13 DIAGNOSIS — M86671 Other chronic osteomyelitis, right ankle and foot: Secondary | ICD-10-CM | POA: Diagnosis not present

## 2021-02-13 LAB — GLUCOSE, CAPILLARY
Glucose-Capillary: 163 mg/dL — ABNORMAL HIGH (ref 70–99)
Glucose-Capillary: 108 mg/dL — ABNORMAL HIGH (ref 70–99)

## 2021-02-13 NOTE — Progress Notes (Signed)
[name], [name] ([id number]) Visit Report for 02/13/2021 SuperBill Details Patient Name: Date of Service: [name], [name] 02/13/2021 Medical Record Number: [id number] Patient Account Number: [id number] Date of Birth/Sex: Treating RN: 23-Nov-1970 (50 y.o. [name] Primary Care Provider: [name] Other Clinician: [name] Referring Provider: Treating Provider/Extender: [name], [name] in Treatment: 23 Diagnosis Coding ICD-10 Codes Code Description [phone] Other chronic osteomyelitis, right ankle and foot E11.621 Type 2 diabetes mellitus with foot ulcer L97.514 Non-pressure chronic ulcer of other part of right foot with necrosis of bone T81.31XS Disruption of external operation (surgical) wound, not elsewhere classified, sequela F17.218 Nicotine dependence, cigarettes, with other nicotine-induced disorders L97.522 Non-pressure chronic ulcer of other part of left foot with fat layer exposed L84 Corns and callosities Facility Procedures CPT4 Code Description Modifier Quantity [id number] G0277-(Facility Use Only) HBOT full body chamber, 25min , 4 ICD-10 Diagnosis Description M86.671 Other chronic osteomyelitis, right ankle and foot E11.621 Type 2 diabetes mellitus with foot ulcer Physician Procedures Quantity CPT4 Code Description Modifier [id number] 17001 - WC PHYS HYPERBARIC OXYGEN THERAPY 1 ICD-10 Diagnosis Description M86.671 Other chronic osteomyelitis, right ankle and foot E11.621 Type 2 diabetes mellitus with foot ulcer Electronic Signature(s) Signed: 02/13/2021 10:50:12 AM By: [name] [name] , , Signed: 02/13/2021 12:12:33 PM By: Kalman Shan DO Entered By: [name] on 02/13/2021 10:50:10

## 2021-02-13 NOTE — Progress Notes (Signed)
KAMAREE, WHEATLEY ([id number]) Visit Report for 02/13/2021 Arrival Information Details Patient Name: Date of Service: [address], [name] 02/13/2021 10:30 A M Medical Record Number: [id number] Patient Account Number: [id number] Date of Birth/Sex: Treating RN: Feb 01, 1971 (50 y.o. [name], [name] Primary Care Cyndi Montejano: [name] Other Clinician: Referring March Joos: Treating Kendyl Festa/Extender: [name], [name] in Treatment: 23 Visit Information History Since Last Visit Added or deleted any medications: No Patient Arrived: Cane Any new allergies or adverse reactions: No Arrival Time: 07:45 Had a fall or experienced change in No Accompanied By: self activities of daily living that may affect Transfer Assistance: None risk of falls: Patient Identification Verified: Yes Signs or symptoms of abuse/neglect since last visito No Secondary Verification Process Completed: Yes Hospitalized since last visit: No Patient Requires Transmission-Based Precautions: No Implantable device outside of the clinic excluding No Patient Has Alerts: No cellular tissue based products placed in the center since last visit: Has Dressing in Place as Prescribed: Yes Pain Present Now: No Electronic Signature(s) Signed: 02/13/2021 12:13:12 PM By: [name] RN, BSN Entered By: [name] on 02/13/2021 08:53:13 -------------------------------------------------------------------------------- Encounter Discharge Information Details Patient Name: Date of Service: [name], [place]. 02/13/2021 10:30 A M Medical Record Number: [id number] Patient Account Number: [id number] Date of Birth/Sex: Treating RN: [birth year] (50 y.o. [name] Primary Care Haruye Lainez: [name] Other Clinician: Referring Janelys Glassner: Treating Shernell Saldierna/Extender: [name], [name] in Treatment: 23 Encounter Discharge Information Items Discharge Condition: Stable Ambulatory Status:  Cane Transportation: Private Auto Accompanied By: self Schedule Follow-up Appointment: Yes Clinical Summary of Care: Notes Patient to hyberbaric oxygen therapy now. Electronic Signature(s) Signed: 02/13/2021 12:13:12 PM By: [name] RN, BSN Entered By: [name] on 02/13/2021 08:56:31 -------------------------------------------------------------------------------- Negative Pressure Wound Therapy Maintenance (NPWT) Details Patient Name: Date of Service: [place] RD, [name] 02/13/2021 10:30 A M Medical Record Number: [id number] Patient Account Number: [id number] Date of Birth/Sex: Treating RN: [birth year] (50 y.o. [name] Primary Care Florie Carico: [name] Other Clinician: Referring Travor Royce: Treating Vail Basista/Extender: [name], [name] in Treatment: 23 NPWT Maintenance Performed for: Wound #7 Right, Lateral, Plantar Foot Additional Injuries Covered: Yes Additional Injuries: Wound #3 Right, Proximal, Lateral Foot Performed By: [name], RN Type: VAC System Coverage Size (sq cm): 0.45 Pressure Type: Constant Pressure Setting: 125 mmHG Drain Type: None Primary Contact: Non-Adherent Sponge/Dressing Type: Gauze Date Initiated: 01/28/2021 Dressing Removed: Yes Quantity of Sponges/Gauze Removed: x1 gauze Canister Changed: No Dressing Reapplied: Yes Quantity of Sponges/Gauze Inserted: x1 gauze bridge to both wounds and to dorsal foot Respones T Treatment: o tolerated well Days On NPWT : 17 Notes duoderm applied to periwounds. Electronic Signature(s) Signed: 02/13/2021 12:13:12 PM By: [name] RN, BSN Entered By: [name] on 02/13/2021 08:54:55 -------------------------------------------------------------------------------- Patient/Caregiver Education Details Patient Name: Date of Service: [name], [place] 11/18/2022andnbsp10:30 A M Medical Record Number: [id number] Patient Account Number: [id number] Date of  Birth/Gender: Treating RN: [birth year] (50 y.o. [name] Primary Care Physician: [name] Other Clinician: Referring Physician: Treating Physician/Extender: [name] Weeks in Treatment: 23 Education Assessment Education Provided To: Patient Education Topics Provided Wound/Skin Impairment: Handouts: Skin Care Do's and Dont's Methods: Explain/Verbal Responses: Reinforcements needed Electronic Signature(s) Signed: 02/13/2021 12:13:12 PM By: [name] RN, BSN Entered By: [name] on 02/13/2021 08:55:21 -------------------------------------------------------------------------------- Wound Assessment Details Patient Name: Date of Service: [name], [place] [name] 02/13/2021 10:30 A M Medical Record Number: [id number] Patient Account Number: [id number] Date of  Birth/Sex: Treating RN: 1970-09-21 (50 y.o. [name] Primary Care Ermalinda Joubert: [name] Other Clinician: Referring Yalena Colon: Treating Takayla Baillie/Extender: [name], [name] in Treatment: 23 Wound Status Wound Number: 3 Primary Etiology: Diabetic Wound/Ulcer of the Lower Extremity Wound Location: Right, Proximal, Lateral Foot Wound Status: Open Wounding Event: Gradually Appeared Date Acquired: 10/29/2020 Weeks Of Treatment: 15 Clustered Wound: No Wound Measurements Length: (cm) 0.5 Width: (cm) 0.5 Depth: (cm) 2 Area: (cm) 0.196 Volume: (cm) 0.393 % Reduction in Area: -55.6% % Reduction in Volume: -346.6% Wound Description Classification: Grade 3 Exudate Amount: Medium Exudate Type: Serosanguineous Exudate Color: red, brown Treatment Notes Wound #3 (Foot) Wound Laterality: Right, Lateral, Proximal Cleanser Soap and Water Discharge Instruction: May shower and wash wound with dial antibacterial soap and water prior to dressing change. Wound Cleanser Discharge Instruction: Cleanse the wound with wound cleanser prior to applying a clean dressing  using gauze sponges, not tissue or cotton balls. Peri-Wound Care Sween Lotion (Moisturizing lotion) Discharge Instruction: Apply moisturizing lotion to foot with dressing changes Topical Primary Dressing VAC Secondary Dressing Secured With Coban Self-Adherent Wrap 4x5 (in/yd) Discharge Instruction: Secure with Coban as directed. Kerlix Roll Sterile, 4.5x3.1 (in/yd) Discharge Instruction: Secure with Kerlix as directed. Compression Wrap Compression Stockings Add-Ons Electronic Signature(s) Signed: 02/13/2021 12:13:12 PM By: [name] RN, BSN Signed: 02/13/2021 12:13:12 PM By: [name] RN, BSN Entered By: [name] on 02/13/2021 08:53:43 -------------------------------------------------------------------------------- Wound Assessment Details Patient Name: Date of Service: [name], [place] [name] 02/13/2021 10:30 A M Medical Record Number: [id number] Patient Account Number: [id number] Date of Birth/Sex: Treating RN: Oct 25, 1970 (50 y.o. [name] Primary Care Alondria Mousseau: [name] Other Clinician: Referring Maysel Mccolm: Treating Vanna Sailer/Extender: [name], [name] in Treatment: 23 Wound Status Wound Number: 7 Primary Etiology: Diabetic Wound/Ulcer of the Lower Extremity Wound Location: Right, Lateral, Plantar Foot Wound Status: Open Wounding Event: Gradually Appeared Date Acquired: 12/31/2020 Weeks Of Treatment: 6 Clustered Wound: No Wound Measurements Length: (cm) 0.5 Width: (cm) 0.4 Depth: (cm) 1.1 Area: (cm) 0.157 Volume: (cm) 0.173 % Reduction in Area: 19.9% % Reduction in Volume: 11.7% Wound Description Classification: Grade 3 Exudate Amount: Medium Exudate Type: Serosanguineous Exudate Color: red, brown Treatment Notes Wound #7 (Foot) Wound Laterality: Plantar, Right, Lateral Cleanser Soap and Water Discharge Instruction: May shower and wash wound with dial antibacterial soap and water prior to dressing change. Wound  Cleanser Discharge Instruction: Cleanse the wound with wound cleanser prior to applying a clean dressing using gauze sponges, not tissue or cotton balls. Peri-Wound Care Sween Lotion (Moisturizing lotion) Discharge Instruction: Apply moisturizing lotion to foot with dressing changes Topical Primary Dressing VAC Secondary Dressing Secured With Coban Self-Adherent Wrap 4x5 (in/yd) Discharge Instruction: Secure with Coban as directed. Kerlix Roll Sterile, 4.5x3.1 (in/yd) Discharge Instruction: Secure with Kerlix as directed. Compression Wrap Compression Stockings Add-Ons Electronic Signature(s) Signed: 02/13/2021 12:13:12 PM By: [name] RN, BSN Entered By: [name] on 02/13/2021 08:53:43 -------------------------------------------------------------------------------- Vitals Details Patient Name: Date of Service: [name], [name] 02/13/2021 10:30 A M Medical Record Number: [id number] Patient Account Number: [id number] Date of Birth/Sex: Treating RN: 10/29/1970 (50 y.o. [name] Primary Care Joniqua Sidle: [name] Other Clinician: Referring Jahni Nazar: Treating Zacari Radick/Extender: [name], [name] in Treatment: 23 Vital Signs Time Taken: 07:45 Temperature (F): 97.8 Height (in): 65 Pulse (bpm): 103 Weight (lbs): 196 Respiratory Rate (breaths/min): 20 Body Mass Index (BMI): 32.6 Blood Pressure (mmHg): 146/89 Reference Range: 80 - 120 mg / dl Electronic Signature(s) Signed: 02/13/2021  12:13:12 PM By: [name] RN, BSN Entered By: [name] on 02/13/2021 08:53:30

## 2021-02-13 NOTE — Progress Notes (Signed)
[name], [name] ([id number]) Visit Report for 02/13/2021 SuperBill Details Patient Name: Date of Service: [address], [name] 02/13/2021 Medical Record Number: [id number] Patient Account Number: [id number] Date of Birth/Sex: Treating RN: 1970-12-15 (50 y.o. [name] Primary Care Provider: [name] Other Clinician: Referring Provider: Treating Provider/Extender: [name], [name] in Treatment: 23 Diagnosis Coding ICD-10 Codes Code Description [phone] Other chronic osteomyelitis, right ankle and foot E11.621 Type 2 diabetes mellitus with foot ulcer L97.514 Non-pressure chronic ulcer of other part of right foot with necrosis of bone T81.31XS Disruption of external operation (surgical) wound, not elsewhere classified, sequela F17.218 Nicotine dependence, cigarettes, with other nicotine-induced disorders L97.522 Non-pressure chronic ulcer of other part of left foot with fat layer exposed L84 Corns and callosities Facility Procedures CPT4 Code Description Modifier Quantity [id number] 97605 - WOUND VAC-50 SQ CM OR LESS 1 Electronic Signature(s) Signed: 02/13/2021 9:57:21 AM By: Kalman Shan DO Signed: 02/13/2021 12:13:12 PM By: [name] RN, BSN Entered By: [name] on 02/13/2021 08:56:36

## 2021-02-13 NOTE — Progress Notes (Addendum)
[name], [name] ([id number]) Visit Report for 02/13/2021 Arrival Information [name] Patient Name: Date of Service: [name], [name] [name]. 02/13/2021 8:00 A M Medical Record Number: [id number] Patient Account Number: [id number] Date of Birth/Sex: Treating RN: [name] (50 y.o. [name] Primary Care Bren Steers: [name] Other Clinician: [name] Referring Chrstopher Malenfant: Treating Jennet Scroggin/Extender: [name], [name] in Treatment: 23 Visit Information History Since Last Visit All ordered tests and consults were completed: Yes Patient Arrived: Ambulatory Added or deleted any medications: No Arrival Time: 07:35 Any new allergies or adverse reactions: No Accompanied By: Self Had a fall or experienced change in No Transfer Assistance: None activities of daily living that may affect Patient Identification Verified: Yes risk of falls: Secondary Verification Process Completed: Yes Signs or symptoms of abuse/neglect since last visito No Patient Requires Transmission-Based Precautions: No Hospitalized since last visit: No Patient Has Alerts: No Implantable device outside of the clinic excluding No cellular tissue based products placed in the center since last visit: Pain Present Now: No Electronic Signature(s) Signed: 02/13/2021 8:49:21 AM By: [name] [name] , , Entered By: [name] on 02/13/2021 08:49:21 -------------------------------------------------------------------------------- Encounter Discharge Information [name] Patient Name: Date of Service: [name], [name] 02/13/2021 8:00 A M Medical Record Number: [id number] Patient Account Number: [id number] Date of Birth/Sex: Treating RN: [name] (50 y.o. [name] Primary Care [name]: [name] Other Clinician: [name] Referring [name]: Treating [name]/Extender: [name], [name] in Treatment: 23 Encounter Discharge  Information Items Discharge Condition: Stable Ambulatory Status: Ambulatory Discharge Destination: Home Transportation: Private Auto Accompanied By: self Schedule Follow-up Appointment: No Clinical Summary of Care: Electronic Signature(s) Signed: 02/13/2021 10:51:03 AM By: [name] [name] , , Entered By: [name] on 02/13/2021 10:51:03 -------------------------------------------------------------------------------- [name] Patient Name: Date of Service: Joseph Hahn RD, [name] [name]. 02/13/2021 8:00 A M Medical Record Number: [id number] Patient Account Number: [id number] Date of Birth/Sex: Treating RN: [name] (50 y.o. [name] Primary Care [name]: [name] Other Clinician: [name] Referring [name]: Treating [name]/Extender: [name], [name] in Treatment: 23 Vital Signs Time Taken: 08:13 Temperature (F): 97.8 Height (in): 65 Pulse (bpm): 100 Weight (lbs): 196 Respiratory Rate (breaths/min): 20 Body Mass Index (BMI): 32.6 Blood Pressure (mmHg): 146/89 Capillary Blood Glucose (mg/dl): 163 Reference Range: 80 - 120 mg / dl Airway Pulse Oximetry (%): 100 Electronic Signature(s) Signed: 02/13/2021 8:50:25 AM By: [name] [name] , , Entered By: [name] on 02/13/2021 08:50:25

## 2021-02-13 NOTE — Progress Notes (Addendum)
[name] ([id number]) Visit Report for 02/13/2021 HBO Details Patient Name: Date of Service: [name], [name] Joseph J. 02/13/2021 8:00 A M Medical Record Number: [id number] Patient Account Number: [id number] Date of Birth/Sex: Treating RN: 1970-12-28 (50 y.o. [name] Primary Care Stepehn Eckard: [name] Other Clinician: [name] Referring Oseas Detty: Treating Meril Dray/Extender: [name], [name] in Treatment: 23 HBO Treatment Course Details Treatment Course Number: 1 Ordering Zion Ta: [name] Treatments Ordered: otal 40 HBO Treatment Start Date: 12/08/2020 HBO Indication: Diabetic Ulcer(s) of the Lower Extremity HBO Treatment Details Treatment Number: 36 Patient Type: Outpatient Chamber Type: Monoplace Chamber Serial #: [id number] Treatment Protocol: 2.5 ATA with 90 minutes oxygen, with two 5 minute air breaks Treatment Details Compression Rate Down: 2.0 psi / minute De-Compression Rate Up: 2.0 psi / minute A breaks and breathing ir Compress Tx Pressure periods Decompress Decompress Begins Reached (leave unused spaces Begins Ends blank) Chamber Pressure (ATA 1 2.5 2.5 2.5 2.5 2.5 - - 2.5 1 ) Clock Time (24 hr) 08:28 08:39 09:09 09:14 09:44 09:49 - - 10:19 10:31 Treatment Length: 123 (minutes) Treatment Segments: 4 Vital Signs Capillary Blood Glucose Reference Range: 80 - 120 mg / dl HBO Diabetic Blood Glucose Intervention Range: <131 mg/dl or >249 mg/dl Type: Time Vitals Blood Pulse: Respiratory Temperature: Capillary Blood Glucose Pulse Action Taken: Pressure: Rate: Glucose (mg/dl): Meter #: Oximetry (%) Taken: Pre 08:13 146/89 100 20 97.8 163 2 100 Post 10:33 146/98 91 18 97.4 108 2 > 110 mg/dl, proceed with HBO per protocol Treatment Response Treatment Toleration: Well Treatment Completion Status: Treatment Completed without Adverse Event Additional Procedure Documentation Tissue Sevierity: Fat layer exposed Physician  HBO Attestation: I certify that I supervised this HBO treatment in accordance with Medicare guidelines. A trained emergency response team is readily available per Yes hospital policies and procedures. Continue HBOT as ordered. Yes Electronic Signature(s) Signed: 02/13/2021 12:12:33 PM By: Kalman Shan DO Previous Signature: 02/13/2021 10:49:19 AM Version By: [name] [name] , , Entered By: Kalman Shan on 02/13/2021 12:00:28 -------------------------------------------------------------------------------- HBO Safety Checklist Details Patient Name: Date of Service: [name], [place] Joseph J. 02/13/2021 8:00 A M Medical Record Number: [id number] Patient Account Number: [id number] Date of Birth/Sex: Treating RN: [birth year] (50 y.o. [name] Primary Care Faydra Korman: [name] Other Clinician: [name] Referring Jenea Dake: Treating Keldrick Pomplun/Extender: [name], [name] in Treatment: 23 HBO Safety Checklist Items Safety Checklist Consent Form Signed Patient voided / foley secured and emptied When did you last eato 0645 Last dose of injectable or oral agent 0645 Ostomy pouch emptied and vented if applicable NA All implantable devices assessed, documented and approved NA Intravenous access site secured and place NA Valuables secured Linens and cotton and cotton/polyester blend (less than 51% polyester) Personal oil-based products / skin lotions / body lotions removed Wigs or hairpieces removed NA Smoking or tobacco materials removed Books / newspapers / magazines / loose paper removed Cologne, aftershave, perfume and deodorant removed Jewelry removed (may wrap wedding band) Make-up removed NA Hair care products removed Battery operated devices (external) removed Heating patches and chemical warmers removed Titanium eyewear removed NA Nail polish cured greater than 10 hours NA Casting material cured greater than 10  hours NA Hearing aids removed NA Loose dentures or partials removed NA Prosthetics have been removed NA Patient demonstrates correct use of air break device (if applicable) Patient concerns have been addressed Patient grounding bracelet on and cord attached to chamber Specifics for Inpatients (  complete in addition to above) Medication sheet sent with patient NA Intravenous medications needed or due during therapy sent with patient NA Drainage tubes (e.g. nasogastric tube or chest tube secured and vented) NA Endotracheal or Tracheotomy tube secured NA Cuff deflated of air and inflated with saline NA Airway suctioned NA Notes Paper version used prior to treatment. Electronic Signature(s) Signed: 02/13/2021 10:47:42 AM By: [name] [name] , , Previous Signature: 02/13/2021 8:51:24 AM Version By: [name] [name] , , Entered By: [name] on 02/13/2021 10:47:41

## 2021-02-16 ENCOUNTER — Encounter (HOSPITAL_BASED_OUTPATIENT_CLINIC_OR_DEPARTMENT_OTHER): Payer: 59 | Admitting: Internal Medicine

## 2021-02-17 ENCOUNTER — Other Ambulatory Visit: Payer: Self-pay

## 2021-02-17 ENCOUNTER — Encounter (HOSPITAL_BASED_OUTPATIENT_CLINIC_OR_DEPARTMENT_OTHER): Payer: 59 | Admitting: Internal Medicine

## 2021-02-18 ENCOUNTER — Encounter (HOSPITAL_BASED_OUTPATIENT_CLINIC_OR_DEPARTMENT_OTHER): Payer: 59 | Admitting: Physician Assistant

## 2021-02-18 NOTE — Progress Notes (Signed)
[name], [name] ([id number]) Visit Report for 02/18/2021 Arrival Information Details Patient Name: Date of Service: Bourg RD, [name] 02/18/2021 11:15 A M Medical Record Number: [id number] Patient Account Number: [id number] Date of Birth/Sex: Treating RN: Aug 14, 1970 (50 y.o. [name], Meta.Reding Primary Care Shaundra Fullam: [name] Other Clinician: Referring Nil Xiong: Treating Brigett Estell/Extender: [name], [name] in Treatment: 23 Visit Information History Since Last Visit Added or deleted any medications: No Patient Arrived: Cane Any new allergies or adverse reactions: No Arrival Time: 11:17 Had a fall or experienced change in No Accompanied By: self activities of daily living that may affect Transfer Assistance: None risk of falls: Patient Identification Verified: Yes Signs or symptoms of abuse/neglect since last visito No Secondary Verification Process Completed: Yes Hospitalized since last visit: No Patient Requires Transmission-Based Precautions: No Implantable device outside of the clinic excluding No Patient Has Alerts: No cellular tissue based products placed in the center since last visit: Has Dressing in Place as Prescribed: Yes Pain Present Now: Yes Electronic Signature(s) Signed: 02/18/2021 5:48:49 PM By: [name] RN, BSN Entered By: [name] on 02/18/2021 11:17:55 -------------------------------------------------------------------------------- Encounter Discharge Information Details Patient Name: Date of Service: Joseph Hahn RD, [name] 02/18/2021 11:15 A M Medical Record Number: [id number] Patient Account Number: [id number] Date of Birth/Sex: Treating RN: [birth year] (50 y.o. [name] Primary Care Arabel Barcenas: [name] Other Clinician: Referring Rafe Mackowski: Treating Valicia Rief/Extender: [name], [name] in Treatment: 23 Encounter Discharge Information Items Discharge Condition: Stable Ambulatory Status:  Cane Discharge Destination: Home Transportation: Private Auto Accompanied By: self Schedule Follow-up Appointment: Yes Clinical Summary of Care: Electronic Signature(s) Signed: 02/18/2021 5:48:49 PM By: [name] RN, BSN Entered By: [name] on 02/18/2021 11:53:32 -------------------------------------------------------------------------------- Lower Extremity Assessment Details Patient Name: Date of Service: [name], [name] 02/18/2021 11:15 A M Medical Record Number: [id number] Patient Account Number: [id number] Date of Birth/Sex: Treating RN: Dec 19, 1970 (50 y.o. [name] Primary Care Gladyse Corvin: [name] Other Clinician: Referring Travante Knee: Treating Antanette Richwine/Extender: [name], [name] in Treatment: 23 Edema Assessment Assessed: [Left: Yes] [Right: Yes] Edema: [Left: Yes] [Right: Yes] Calf Left: Right: Point of Measurement: From Medial Instep 35 cm 32 cm Ankle Left: Right: Point of Measurement: From Medial Instep 22 cm 23 cm Vascular Assessment Pulses: Dorsalis Pedis Palpable: [Left:Yes] [Right:Yes] Electronic Signature(s) Signed: 02/18/2021 5:48:49 PM By: [name] RN, BSN Entered By: [name] on 02/18/2021 11:22:31 -------------------------------------------------------------------------------- [place] Details Patient Name: Date of Service: [name] 02/18/2021 11:15 A M Medical Record Number: [id number] Patient Account Number: [id number] Date of Birth/Sex: Treating RN: [birth year] (50 y.o. [name] Primary Care Taeshaun Rames: [name] Other Clinician: Referring Brucha Ahlquist: Treating Cordelro Gautreau/Extender: [name], [name] in Treatment: 23 Active Inactive HBO Nursing Diagnoses: Anxiety related to feelings of confinement associated with the hyperbaric oxygen chamber Anxiety related to knowledge deficit of hyperbaric oxygen therapy and treatment  procedures Potential for barotraumas to ears, sinuses, teeth, and lungs or cerebral gas embolism related to changes in atmospheric pressure inside hyperbaric oxygen chamber Potential for oxygen toxicity seizures related to delivery of 100% oxygen at an increased atmospheric pressure Potential for pulmonary oxygen toxicity related to delivery of 100% oxygen at an increased atmospheric pressure Goals: Barotrauma will be prevented during HBO2 Date Initiated: 01/28/2021 Target Resolution Date: 02/25/2021 Goal Status: Active Patient and/or family will be able to state/discuss factors appropriate to the management of their disease process during treatment Date Initiated:  01/28/2021 Target Resolution Date: 02/25/2021 Goal Status: Active Patient will tolerate the hyperbaric oxygen therapy treatment Date Initiated: 01/28/2021 Target Resolution Date: 02/25/2021 Goal Status: Active Interventions: Administer the correct therapeutic gas delivery based on the patients needs and limitations, per physician order Assess and provide for patients comfort related to the hyperbaric environment and equalization of middle ear Assess for signs and symptoms related to adverse events, including but not limited to confinement anxiety, pneumothorax, oxygen toxicity and baurotrauma Notes: Osteomyelitis Nursing Diagnoses: Infection: osteomyelitis Knowledge deficit related to disease process and management Goals: Patient/caregiver will verbalize understanding of disease process and disease management Date Initiated: 11/26/2020 Target Resolution Date: 02/24/2021 Goal Status: Active Patient's osteomyelitis will resolve Date Initiated: 11/26/2020 Target Resolution Date: 02/24/2021 Goal Status: Active Interventions: Assess for signs and symptoms of osteomyelitis resolution every visit Provide education on osteomyelitis Screen for HBO Treatment Activities: Systemic antibiotics : 11/26/2020 Notes: Wound/Skin  Impairment Nursing Diagnoses: Impaired tissue integrity Goals: Patient/caregiver will verbalize understanding of skin care regimen Date Initiated: 09/05/2020 Target Resolution Date: 02/24/2021 Goal Status: Active Ulcer/skin breakdown will have a volume reduction of 30% by week 4 Date Initiated: 09/05/2020 Date Inactivated: 10/08/2020 Target Resolution Date: 10/03/2020 Goal Status: Met Interventions: Assess patient/caregiver ability to obtain necessary supplies Assess patient/caregiver ability to perform ulcer/skin care regimen upon admission and as needed Assess ulceration(s) every visit Provide education on ulcer and skin care Treatment Activities: Patient referred to home care : 09/05/2020 Skin care regimen initiated : 09/05/2020 Topical wound management initiated : 09/05/2020 Notes: Electronic Signature(s) Signed: 02/18/2021 5:48:49 PM By: [name] RN, BSN Entered By: [name] on 02/18/2021 11:35:20 -------------------------------------------------------------------------------- Pain Assessment Details Patient Name: Date of Service: Joseph Hahn RD, [name] 02/18/2021 11:15 A M Medical Record Number: [id number] Patient Account Number: [id number] Date of Birth/Sex: Treating RN: [birth year] (50 y.o. [name] Primary Care Micheala Morissette: [name] Other Clinician: Referring Liberato Stansbery: Treating Malone Vanblarcom/Extender: [name], [name] in Treatment: 23 Active Problems Location of Pain Severity and Description of Pain Patient Has Paino Yes Site Locations Pain Location: Generalized Pain Rate the pain. Current Pain Level: 7 Pain Management and Medication Current Pain Management: Medication: No Cold Application: No Rest: No Massage: No Activity: No T.E.N.S.: No Heat Application: No Leg drop or elevation: No Is the Current Pain Management Adequate: Adequate How does your wound impact your activities of daily livingo Sleep: No Bathing: No Appetite:  No Relationship With Others: No Bladder Continence: No Emotions: No Bowel Continence: No Work: No Toileting: No Drive: No Dressing: No Hobbies: No Notes Pain in callous left foot Electronic Signature(s) Signed: 02/18/2021 5:48:49 PM By: [name] RN, BSN Entered By: [name] on 02/18/2021 11:30:55 -------------------------------------------------------------------------------- Patient/Caregiver Education Details Patient Name: Date of Service: Joseph Hahn RD, [place]. 11/23/2022andnbsp11:15 A M Medical Record Number: [id number] Patient Account Number: [id number] Date of Birth/Gender: Treating RN: [birth year] (50 y.o. [name] Primary Care Physician: [name] Other Clinician: Referring Physician: Treating Physician/Extender: [name], [name] in Treatment: 23 Education Assessment Education Provided To: Patient Education Topics Provided Wound/Skin Impairment: Handouts: Skin Care Do's and Dont's Methods: Explain/Verbal Responses: Reinforcements needed Electronic Signature(s) Signed: 02/18/2021 5:48:49 PM By: [name] RN, BSN Entered By: [name] on 02/18/2021 11:36:27 -------------------------------------------------------------------------------- Wound Assessment Details Patient Name: Date of Service: Joseph Hahn RD, [name] 02/18/2021 11:15 A M Medical Record Number: [id number] Patient Account Number: [id number] Date of Birth/Sex: Treating RN: 09-24-1970 (50 y.o. [name] Primary Care Tai Skelly: [name] Other Clinician:  Referring Dyana Magner: Treating Tadd Holtmeyer/Extender: [name], [name] in Treatment: 23 Wound Status Wound Number: 3 Primary Diabetic Wound/Ulcer of the Lower Extremity Etiology: Wound Location: Right, Proximal, Lateral Foot Wound Open Wounding Event: Gradually Appeared Status: Date Acquired: 10/29/2020 Comorbid Anemia, Coronary Artery Disease, Hypertension, Type II Weeks Of  Treatment: 16 History: Diabetes, Osteomyelitis, Neuropathy Clustered Wound: No Photos Wound Measurements Length: (cm) 0.5 Width: (cm) 0.4 Depth: (cm) 0.7 Area: (cm) 0.157 Volume: (cm) 0.11 % Reduction in Area: -24.6% % Reduction in Volume: -25% Tunneling: Yes Position (o'clock): 12 Maximum Distance: (cm) 1.3 Undermining: No Wound Description Classification: Grade 3 Wound Margin: Distinct, outline attached Exudate Amount: Large Exudate Type: Purulent Exudate Color: yellow, brown, green Foul Odor After Cleansing: No Slough/Fibrino No Wound Bed Granulation Amount: Large (67-100%) Exposed Structure Granulation Quality: Red Fascia Exposed: No Necrotic Amount: None Present (0%) Fat Layer (Subcutaneous Tissue) Exposed: Yes Tendon Exposed: No Muscle Exposed: No Joint Exposed: No Bone Exposed: No Assessment Notes macerated periwound. Treatment Notes Wound #3 (Foot) Wound Laterality: Right, Lateral, Proximal Cleanser Soap and Water Discharge Instruction: May shower and wash wound with dial antibacterial soap and water prior to dressing change. Wound Cleanser Discharge Instruction: Cleanse the wound with wound cleanser prior to applying a clean dressing using gauze sponges, not tissue or cotton balls. Peri-Wound Care Sween Lotion (Moisturizing lotion) Discharge Instruction: Apply moisturizing lotion to foot with dressing changes Topical Primary Dressing Iodoform packing strip 1/4 (in) Discharge Instruction: Lightly pack as instructed Secondary Dressing Woven Gauze Sponge, Non-Sterile 4x4 in Discharge Instruction: Apply over primary dressing as directed. ABD Pad, 5x9 Discharge Instruction: Apply over primary dressing as directed. Secured With [employer] 4x5 (in/yd) Discharge Instruction: Secure with Coban as directed. Kerlix Roll Sterile, 4.5x3.1 (in/yd) Discharge Instruction: Secure with Kerlix as directed. Compression Wrap Compression  Stockings Add-Ons Electronic Signature(s) Signed: 02/18/2021 5:48:49 PM By: [name] RN, BSN Entered By: [name] on 02/18/2021 11:36:01 -------------------------------------------------------------------------------- Wound Assessment Details Patient Name: Date of Service: Joseph Hahn RD, [name] 02/18/2021 11:15 A M Medical Record Number: [id number] Patient Account Number: [id number] Date of Birth/Sex: Treating RN: 05-14-1970 (50 y.o. [name] Primary Care Naryah Clenney: [name] Other Clinician: Referring Ambri Miltner: Treating Alga Southall/Extender: [name], [name] in Treatment: 23 Wound Status Wound Number: 7 Primary Diabetic Wound/Ulcer of the Lower Extremity Etiology: Wound Location: Right, Lateral, Plantar Foot Wound Open Wounding Event: Gradually Appeared Status: Date Acquired: 12/31/2020 Comorbid Anemia, Coronary Artery Disease, Hypertension, Type II Weeks Of Treatment: 7 Weeks Of Treatment: 7 History: Diabetes, Osteomyelitis, Neuropathy Clustered Wound: No Photos Wound Measurements Length: (cm) 0.5 Width: (cm) 0.5 Depth: (cm) 1.7 Area: (cm) 0.196 Volume: (cm) 0.334 % Reduction in Area: 0% % Reduction in Volume: -70.4% Epithelialization: None Tunneling: No Undermining: No Wound Description Classification: Grade 3 Wound Margin: Distinct, outline attached Exudate Amount: Medium Exudate Type: Serosanguineous Exudate Color: red, brown Foul Odor After Cleansing: No Slough/Fibrino No Wound Bed Granulation Amount: Large (67-100%) Exposed Structure Granulation Quality: Red Fascia Exposed: No Necrotic Amount: None Present (0%) Fat Layer (Subcutaneous Tissue) Exposed: Yes Tendon Exposed: No Muscle Exposed: No Joint Exposed: No Bone Exposed: No Assessment Notes macerated periwound. Treatment Notes Wound #7 (Foot) Wound Laterality: Plantar, Right, Lateral Cleanser Soap and Water Discharge Instruction: May shower and wash  wound with dial antibacterial soap and water prior to dressing change. Wound Cleanser Discharge Instruction: Cleanse the wound with wound cleanser prior to applying a clean dressing using gauze sponges, not tissue or cotton balls. Peri-Wound  Care Sween Lotion (Moisturizing lotion) Discharge Instruction: Apply moisturizing lotion to foot with dressing changes Topical Primary Dressing Iodoform packing strip 1/4 (in) Discharge Instruction: Lightly pack as instructed Secondary Dressing Woven Gauze Sponge, Non-Sterile 4x4 in Discharge Instruction: Apply over primary dressing as directed. ABD Pad, 5x9 Discharge Instruction: Apply over primary dressing as directed. Secured With [employer] 4x5 (in/yd) Discharge Instruction: Secure with Coban as directed. Kerlix Roll Sterile, 4.5x3.1 (in/yd) Discharge Instruction: Secure with Kerlix as directed. Compression Wrap Compression Stockings Add-Ons Electronic Signature(s) Signed: 02/18/2021 5:48:49 PM By: [name] RN, BSN Entered By: [name] on 02/18/2021 11:34:56 -------------------------------------------------------------------------------- Vitals Details Patient Name: Date of Service: Joseph Hahn RD, [name] 02/18/2021 11:15 A M Medical Record Number: [id number] Patient Account Number: [id number] Date of Birth/Sex: Treating RN: 1970-08-23 (50 y.o. [name] Primary Care Mary Hockey: [name] Other Clinician: Referring Lasaro Primm: Treating Reynald Woods/Extender: [name], [name] in Treatment: 23 Vital Signs Time Taken: 11:19 Temperature (F): 97.5 Height (in): 65 Pulse (bpm): 97 Weight (lbs): 196 Respiratory Rate (breaths/min): 20 Body Mass Index (BMI): 32.6 Blood Pressure (mmHg): 142/96 Capillary Blood Glucose (mg/dl): 140 Reference Range: 80 - 120 mg / dl Electronic Signature(s) Signed: 02/18/2021 5:48:49 PM By: [name] RN, BSN Entered By: [name] on 02/18/2021  11:19:54

## 2021-02-18 NOTE — Progress Notes (Addendum)
[name], [name] ([id number]) Visit Report for 02/18/2021 Chief Complaint Document Details Patient Name: Date of Service: [address] RD, [name] 02/18/2021 11:15 A M Medical Record Number: [id number] Patient Account Number: [id number] Date of Birth/Sex: Treating RN: 1970/10/26 (50 y.o. [name] Primary Care Provider: [name] Other Clinician: Referring Provider: Treating Provider/Extender: [name], [name] in Treatment: 23 Information Obtained from: Patient Chief Complaint 09/05/2020; patient is here for review of the wound extensively on a right TMA amputation site. He also has a wound on the plantar aspect of the left foot Electronic Signature(s) Signed: 02/18/2021 11:48:13 AM By: Worthy Keeler PA-C Entered By: Worthy Keeler on 02/18/2021 11:48:13 -------------------------------------------------------------------------------- HPI Details Patient Name: Date of Service: [name], [name] 02/18/2021 11:15 A M Medical Record Number: [id number] Patient Account Number: [id number] Date of Birth/Sex: Treating RN: 1971-02-11 (50 y.o. [name] Primary Care Provider: [name] Other Clinician: Referring Provider: Treating Provider/Extender: [name], [name] in Treatment: 23 History of Present Illness HPI Description: ADMISSION 09/05/2020 This is a 51 year old man who has type 2 diabetes. Essentially the problem began admitted to [facility] in December with infection gas gangrene. Cultures ultimately grew staph lugdunensis. 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 March Rummage. Shortly thereafter he developed a nonhealing wound with wound dehiscence noted on 05/09/2020. He had a wound VAC I think for a period of time after the surgery but it did not help. He has undergone an IandD and skin graft on the right foot on 05/16/2020. He underwent a further IandD on 06/04/2020 not debrided to bone. Noted  to have exposed bone with osteomyelitis on 07/29/2020 he underwent an operative debridement with resection of the metatarsal. Culture of the metatarsal grew Enterobacter cloacae I. He has been followed by Dr. [name] of infectious disease. Currently on Levaquin and doxycycline I think at about the 4-week of 6 mark. At the last visit with Dr. March Rummage he was supposed to have use Santyl although I do not think he has it and he has been using I think a Betadine wet-to-dry. He is referred here by Dr. [name] for our review who saw him yesterday. I cannot see that he has had imaging studies of the right foot. No MRI Past medical history includes type 2 diabetes, in 2021 and ulcer of the left foot that ultimately healed with a wound VAC, stage III chronic kidney disease, first toe amputations bilaterally, hypertension, still a 1/2 pack/day smoker. The patient had ABIs on 08/09/2018 which was 1.00 on the right and 0.96 on the left both areas had triphasic waveforms. Not felt to have an arterial issue. 09/12/2020; 1 week follow-up. He has been doing the dressings along with home health. This is a deep punched out area in the incision of his transmit site. We use silver alginate last week to help with drainage and antibacterial properties. He tolerated this well 6/24; the patient was actually at his podiatrist this morning who debrided the wound. We have been using silver alginate. They are going to follow him in 6 weeks. 6/29; patient is using silver alginate. Apparently saw Dr. March Rummage this week although I have not checked his note I will try to do so. He is following up in 6 weeks. This was the original transmetatarsal amputation when I first saw this 3 mid part of his amputation site was deep down to bone however this seems to have progressively close down which  is gratifying to see. He still has a fairly callus uneven surface however spreading this apart its difficult to identify anything that does not look epithelialized.  When I first saw this I thought he would require an extensive debridement perhaps back in the OR by Dr. March Rummage however all of this appears to be looking better and at this point I would continue with the silver alginate see if this holds together over the next several weeks. He does not have an arterial issue 7/13; 2-week follow-up. Unfortunately the area did not hold together there was drainage on his dressing. Still copious amounts of callus. It was difficult to see where this actually was open therefore I elected to go ahead with a vigorous debridement. Still using silver alginate 7/20 I brought him back after extensive callus debridement last week. The only open area was on the medial part of the foot extensive debridement of thick surrounding callus and I was able to do identify the remaining tunneling area. 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 This is the area that we may have been following. This is smaller this week but still surrounded with thick callus He came in today with a large area of callus on the left midfoot. This I had noticed previously. HOWEVER our intake nurse clearly incorrectly documented this as opening. The callus and split there was an odor so all of this had to be removed. 10/29/2020 unfortunately upon evaluation today this patient appears to have significant mount of callus noted at this point. There does not appear to be any signs of active infection systemically which is great although locally he is having some discomfort around the lateral portion of his foot on the right. The left foot is no having a lot of callus I am not certain even has an open wound if there is anything is extremely tiny. 11/05/2020 upon evaluation today patient's wounds actually appear to be doing a bit better compared to last week since we have uncovered some of these areas I think there are some definite improvements. With that  being said there is still quite a bit of tunneling and undermining at many locations that again has me concerned I still think that the MRI is the best thing to do he has that scheduled for Saturday. 11/26/2020 upon evaluation today patient appears to be doing decently well in regard to his foot in general. He did have an MRI I did review this and he had multiple findings on MRI consistent with osteomyelitis across the board in regard pretty much to his remaining metatarsal regions. Nonetheless there was also some evidence of some involvement of the navicular bone as well. Either way I think that this patient actually is in desperate need of get him in for hyperbaric oxygen therapy we discussed that today that will be detailed in the plan. Nonetheless he is also still on the doxycycline which I think is appropriate for the time being as well I did go ahead and place him on that following the results of the MRI based on what we were seeing. I gave him that prescription for 14 days for the time being and we will go from there. 12/03/2020 upon evaluation today patient appears to be doing decently well in regard to his wounds in general in regard to the right foot. With that being said he does have a callus on the left foot in the end there  was actually an area open area here that will need to be addressed. This is small and I think we take care of it now hopefully will heal quite readily. Fortunately I do not see any signs of infection worsening I am getting need to refill the antibiotic for him today. He has been taking doxycycline which seems to be doing well. We will also get a go ahead and get him approved for HBO therapy. 12/31/2020 upon evaluation today patient appears to be doing really roughly the same as compared to last time I saw him. Fortunately there is no evidence of active infection which is great news systemically he has started hyperbarics he seems to be doing decently well he said a couple  times where anxiety kind of got to him but overall I think he is doing awesome. He is no longer on the antibiotic therapy. With that being said I do believe that he is going to need likely some additional antibiotics based on what I am seeing today we can actually obtain a bone culture from the plantar wound that was hiding underneath the callus. He was having pain in this area which is what may be try to clear away the callus to look sure enough there was an actual opening that went deeper into the wound bed region here. 01/21/2021 upon evaluation today patient appears to be doing about the same in regard to his foot in general. He does have some depth to some of the wound areas I really think he would benefit from is trying to see about getting him set up for a gauze VAC. I think that the gauze VAC could be helpful for him and will work much more effectively with what we are seeing as compared to traditional foam wound VAC. The patient is in agreement with giving this a trial. Nonetheless unfortunately think that he overall seems to be doing really well and I think we are headed in the right direction as far as the overall healing is concerned. 01/28/2021 upon evaluation today patient appears to be doing well with regard to his wound all things considered he does have a lot of callus buildup especially on the plantar aspect of the left foot. There is no wound here but a significant amount of callus. That is getting need to be addressed today. Subsequently he also has a lot of callus on the right foot as well as some of the area around the wounds actually being significantly callused. Again I need to clear this away today. 02/04/2021 upon evaluation today patient actually seems to be doing quite well with regard to his foot ulcer. I do believe that the hyperbaric oxygen therapy has been beneficial for him thus far also think that he is doing well with the wound VAC. In combination I think he has an  excellent plan at this time as far as treatment is concerned. I do not see any signs of evidence of infection which is great news as well. 02/11/2021 upon evaluation today patient appears to be doing okay in regard to his foot ulcer though still has deep wounds that do not seem to be doing nearly as good as what I would like to see. I think that the wound VAC is helping but I do believe there still bone in the base of the wound I think we probably need to see about put him back on an antibiotic, do a PCR culture today to see what we find in order to determine  whether or not we need to have him on antibiotics which I think we probably do is just a matter which 1. 02/18/2021 upon evaluation today patient appears to be doing okay with regard to his foot ulcer. He has been using the wound VAC although there is a lot of drainage and to be honest I am not certain this is doing as well as we wanted to right now especially considering the fact the soles of gotten so small which in some ways is good but in other ways is still problematic for Sorbact standpoint. I am not even certain we will be able to reinitiate the VAC even next week. Nonetheless for now he is on the [place] which is definitely something that we need to get going I am also can see about a referral back to infectious disease to see what they have to say in regard to what is going on currently as well. He voiced understanding and is in agreement with that plan. I did explain this is a very touchy go situation which hinges on [place] getting this healed and the infection improved if not he is looking at amputation. Obviously this is a limb salvage Plan at this point. Electronic Signature(s) Signed: 02/18/2021 11:48:27 AM By: Worthy Keeler PA-C Entered By: Worthy Keeler on 02/18/2021 11:48:26 -------------------------------------------------------------------------------- Physical Exam Details Patient Name: Date of Service: [name], [name]  02/18/2021 11:15 A M Medical Record Number: [id number] Patient Account Number: [id number] Date of Birth/Sex: Treating RN: 1970/06/23 (50 y.o. [name] Primary Care Provider: [name] Other Clinician: Referring Provider: Treating Provider/Extender: [name], [name] in Treatment: 36 Constitutional Well-nourished and well-hydrated in no acute distress. Respiratory normal breathing without difficulty. Psychiatric this patient is able to make decisions and demonstrates good insight into disease process. Alert and Oriented x 3. pleasant and cooperative. Notes Upon inspection patient's wounds do show signs of significant improvement in some ways although he still has a lot of drainage which is somewhat concerning. Fortunately there is no sign of active infection locally nor systemically at this point. No fevers, chills, nausea, vomiting, or diarrhea. Electronic Signature(s) Signed: 02/18/2021 11:48:46 AM By: Worthy Keeler PA-C Entered By: Worthy Keeler on 02/18/2021 11:48:45 -------------------------------------------------------------------------------- Physician Orders Details Patient Name: Date of Service: [name], [name] 02/18/2021 11:15 A M Medical Record Number: [id number] Patient Account Number: [id number] Date of Birth/Sex: Treating RN: 02/03/1971 (50 y.o. [name] Primary Care Provider: [name] Other Clinician: Referring Provider: Treating Provider/Extender: [name], [name] in Treatment: 23 Verbal / Phone Orders: No Diagnosis Coding ICD-10 Coding Code Description [phone] Other chronic osteomyelitis, right ankle and foot E11.621 Type 2 diabetes mellitus with foot ulcer L97.514 Non-pressure chronic ulcer of other part of right foot with necrosis of bone T81.31XS Disruption of external operation (surgical) wound, not elsewhere classified, sequela F17.218 Nicotine dependence, cigarettes, with other  nicotine-induced disorders L97.522 Non-pressure chronic ulcer of other part of left foot with fat layer exposed L84 Corns and callosities Follow-up Appointments ppointment in 1 week. Margarita Grizzle- continue/finish hyberbarics oxygen therapy next week Return A **Hold vac this week.*** PATIENT TO CHANGE OUTER DRESSING ON FRIDAY OR SATURDAY Someone will call you with infectious disease appointment. Nurse Visit: - Monday 02/23/2021 Bathing/ Shower/ Hygiene May shower with protection but do not get wound dressing(s) wet. Negative Presssure Wound Therapy Wound Vac to wound continuously at 142mm/hg pressure - ***HOLD VAC*** Sterile Gauze Packing Edema Control -  Lymphedema / SCD / Other Elevate legs to the level of the heart or above for 30 minutes daily and/or when sitting, a frequency of: Avoid standing for long periods of time. Moisturize legs daily. - to foot with dressing changes Off-Loading Open toe surgical shoe to: - Wear at all times except driving. on right foot Other: - may wear shoe with memory foam insert to left foot, cut hole where callous is and insert in shoe to help offload Additional Orders / Instructions Stop/Decrease Smoking Follow Nutritious Diet Non Wound Condition Other Non Wound Condition Orders/Instructions: - cushion bottom of left foot with orthopedic felt or foam Wound Treatment Wound #3 - Foot Wound Laterality: Right, Lateral, Proximal Cleanser: Soap and Water 3 x Per Week/30 Days Discharge Instructions: May shower and wash wound with dial antibacterial soap and water prior to dressing change. Cleanser: Wound Cleanser 3 x Per Week/30 Days Discharge Instructions: Cleanse the wound with wound cleanser prior to applying a clean dressing using gauze sponges, not tissue or cotton balls. Peri-Wound Care: Sween Lotion (Moisturizing lotion) 3 x Per Week/30 Days Discharge Instructions: Apply moisturizing lotion to foot with dressing changes Prim Dressing: Iodoform packing  strip 1/4 (in) 3 x Per Week/30 Days ary Discharge Instructions: Lightly pack as instructed Secondary Dressing: Woven Gauze Sponge, Non-Sterile 4x4 in 3 x Per Week/30 Days Discharge Instructions: Apply over primary dressing as directed. Secondary Dressing: ABD Pad, 5x9 3 x Per Week/30 Days Discharge Instructions: Apply over primary dressing as directed. Secured With: Coban Self-Adherent Wrap 4x5 (in/yd) (Generic) 3 x Per Week/30 Days Discharge Instructions: Secure with Coban as directed. Secured With: [employer], 4.5x3.1 (in/yd) (Generic) 3 x Per Week/30 Days Discharge Instructions: Secure with Kerlix as directed. Wound #7 - Foot Wound Laterality: Plantar, Right, Lateral Cleanser: Soap and Water 3 x Per Week/30 Days Discharge Instructions: May shower and wash wound with dial antibacterial soap and water prior to dressing change. Cleanser: Wound Cleanser 3 x Per Week/30 Days Discharge Instructions: Cleanse the wound with wound cleanser prior to applying a clean dressing using gauze sponges, not tissue or cotton balls. Peri-Wound Care: Sween Lotion (Moisturizing lotion) 3 x Per Week/30 Days Discharge Instructions: Apply moisturizing lotion to foot with dressing changes Prim Dressing: Iodoform packing strip 1/4 (in) 3 x Per Week/30 Days ary Discharge Instructions: Lightly pack as instructed Secondary Dressing: Woven Gauze Sponge, Non-Sterile 4x4 in 3 x Per Week/30 Days Discharge Instructions: Apply over primary dressing as directed. Secondary Dressing: ABD Pad, 5x9 3 x Per Week/30 Days Discharge Instructions: Apply over primary dressing as directed. Secured With: Coban Self-Adherent Wrap 4x5 (in/yd) (Generic) 3 x Per Week/30 Days Discharge Instructions: Secure with Coban as directed. Secured With: [employer], 4.5x3.1 (in/yd) (Generic) 3 x Per Week/30 Days Discharge Instructions: Secure with Kerlix as directed. Consults Infectious Disease - Infectious Disease referral for  right foot diabetic ulcers positive MRSA infection with August 2022 diagnosis of osteomyelitis. PCR culture attached. - (ICD10 [id number] - Other chronic osteomyelitis, right ankle and foot) Dermatology-[place] - referral to [facility] D[facility]atology related to upper arms and chest recurrence of rash like areas. Electronic Signature(s) Signed: 02/18/2021 5:33:12 PM By: Worthy Keeler PA-C Signed: 02/18/2021 5:48:49 PM By: [name] RN, BSN Entered By: [name] on 02/18/2021 11:51:54 Prescription 02/18/2021 -------------------------------------------------------------------------------- Denton Brick PA Patient Name: Provider: March 09, 1971 [id number] Date of Birth: NPI#: Jerilynn Mages [id number] Sex: DEA #: [phone] Phone #: License #: [place] Patient Address: [address] [place], [place] [address] [address] [place], [place] [address] [phone] Allergies penicillin; bee venom protein (honey bee) Provider's Orders Infectious Disease - ICD10: P54.656 - Infectious Disease referral for right foot diabetic ulcers positive MRSA infection with August 2022 diagnosis of osteomyelitis. PCR culture attached. Hand Signature: Date(s): Prescription 02/18/2021 Denton Brick PA Patient Name: Provider: [birth year] [id number] Date of Birth: NPI#: Jerilynn Mages [id number] Sex: DEA #: [phone] Phone #: License #: [place] Patient Address: [place] [address] [place], [place] [address] [place], [place] [address] [phone] Allergies penicillin; bee venom protein (honey bee) Provider's Orders Dermatology-[place] - referral to [facility] Dermatology related to upper arms and chest recurrence of rash like areas. Hand Signature: Date(s): Electronic Signature(s) Signed: 02/18/2021 5:33:12 PM By: Worthy Keeler PA-C Signed: 02/18/2021  5:48:49 PM By: [name] RN, BSN Entered By: [name] on 02/18/2021 11:51:55 -------------------------------------------------------------------------------- Problem List Details Patient Name: Date of Service: [name], [name] 02/18/2021 11:15 A M Medical Record Number: [id number] Patient Account Number: [id number] Date of Birth/Sex: Treating RN: 1970-11-09 (50 y.o. [name], [name] Primary Care Provider: [name] Other Clinician: Referring Provider: Treating Provider/Extender: [name], [name] in Treatment: 23 Active Problems ICD-10 Encounter Code Description Active Date MDM Diagnosis [phone] Other chronic osteomyelitis, right ankle and foot 09/05/2020 No Yes E11.621 Type 2 diabetes mellitus with foot ulcer 09/05/2020 No Yes L97.514 Non-pressure chronic ulcer of other part of right foot with necrosis of bone 09/05/2020 No Yes T81.31XS Disruption of external operation (surgical) wound, not elsewhere classified, 09/05/2020 No Yes sequela F17.218 Nicotine dependence, cigarettes, with other nicotine-induced disorders 11/26/2020 No Yes L97.522 Non-pressure chronic ulcer of other part of left foot with fat layer exposed 10/22/2020 No Yes L84 Corns and callosities 01/28/2021 No Yes Inactive Problems Resolved Problems Electronic Signature(s) Signed: 02/18/2021 11:48:06 AM By: Worthy Keeler PA-C Entered By: Worthy Keeler on 02/18/2021 11:48:05 -------------------------------------------------------------------------------- Progress Note Details Patient Name: Date of Service: [name], [name] 02/18/2021 11:15 A M Medical Record Number: [id number] Patient Account Number: [id number] Date of Birth/Sex: Treating RN: [birth year] (50 y.o. [name] Primary Care Provider: [name] Other Clinician: Referring Provider: Treating Provider/Extender: [name], [name] in Treatment: 23 Subjective Chief Complaint Information  obtained from Patient 09/05/2020; patient is here for review of the wound extensively on a right TMA amputation site. He also has a wound on the plantar aspect of the left foot History of Present Illness (HPI) ADMISSION 09/05/2020 This is a 50 year old man who has type 2 diabetes. Essentially the problem began admitted to [facility] in December with infection gas gangrene. Cultures ultimately grew staph lugdunensis. 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 March Rummage. Shortly thereafter he developed a nonhealing wound with wound dehiscence noted on 05/09/2020. He had a wound VAC I think for a period of time after the surgery but it did not help. He has undergone an IandD and skin graft on the right foot on 05/16/2020. He underwent a further IandD on 06/04/2020 not debrided to bone. 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 Culture of the metatarsal grew Enterobacter cloacae I. He has been followed by Dr. [name] of infectious disease. Currently on Levaquin and doxycycline I think at about the 4-week of 6 mark. At the  last visit with Dr. March Rummage he was supposed to have use Santyl although I do not think he has it and he has been using I think a Betadine wet-to-dry. He is referred here by Dr. [name] for our review who saw him yesterday. I cannot see that he has had imaging studies of the right foot. No MRI Past medical history includes type 2 diabetes, in 2021 and ulcer of the left foot that ultimately healed with a wound VAC, stage III chronic kidney disease, first toe amputations bilaterally, hypertension, still a 1/2 pack/day smoker. The patient had ABIs on 08/09/2018 which was 1.00 on the right and 0.96 on the left both areas had triphasic waveforms. Not felt to have an arterial issue. 09/12/2020; 1 week follow-up. He has been doing the dressings along with home health. This is a deep punched out  area in the incision of his transmit site. We use silver alginate last week to help with drainage and antibacterial properties. He tolerated this well 6/24; the patient was actually at his podiatrist this morning who debrided the wound. We have been using silver alginate. They are going to follow him in 6 weeks. 6/29; patient is using silver alginate. Apparently saw Dr. March Rummage this week although I have not checked his note I will try to do so. He is following up in 6 weeks. This was the original transmetatarsal amputation when I first saw this 3 mid part of his amputation site was deep down to bone however this seems to have progressively close down which is gratifying to see. He still has a fairly callus uneven surface however spreading this apart its difficult to identify anything that does not look epithelialized. When I first saw this I thought he would require an extensive debridement perhaps back in the OR by Dr. March Rummage however all of this appears to be looking better and at this point I would continue with the silver alginate see if this holds together over the next several weeks. He does not have an arterial issue 7/13; 2-week follow-up. Unfortunately the area did not hold together there was drainage on his dressing. Still copious amounts of callus. It was difficult to see where this actually was open therefore I elected to go ahead with a vigorous debridement. Still using silver alginate 7/20 I brought him back after extensive callus debridement last week. The only open area was on the medial part of the foot extensive debridement of thick surrounding callus and I was able to do identify the remaining tunneling area. 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 This is the area that we may have been following. This is smaller this week but still surrounded with thick callus He came in today with a large area of callus on the left  midfoot. This I had noticed previously. HOWEVER our intake nurse clearly incorrectly documented this as opening. The callus and split there was an odor so all of this had to be removed. 10/29/2020 unfortunately upon evaluation today this patient appears to have significant mount of callus noted at this point. There does not appear to be any signs of active infection systemically which is great although locally he is having some discomfort around the lateral portion of his foot on the right. The left foot is no having a lot of callus I am not certain even has an open wound if there is anything is extremely tiny. 11/05/2020 upon evaluation today  patient's wounds actually appear to be doing a bit better compared to last week since we have uncovered some of these areas I think there are some definite improvements. With that being said there is still quite a bit of tunneling and undermining at many locations that again has me concerned I still think that the MRI is the best thing to do he has that scheduled for Saturday. 11/26/2020 upon evaluation today patient appears to be doing decently well in regard to his foot in general. He did have an MRI I did review this and he had multiple findings on MRI consistent with osteomyelitis across the board in regard pretty much to his remaining metatarsal regions. Nonetheless there was also some evidence of some involvement of the navicular bone as well. Either way I think that this patient actually is in desperate need of get him in for hyperbaric oxygen therapy we discussed that today that will be detailed in the plan. Nonetheless he is also still on the doxycycline which I think is appropriate for the time being as well I did go ahead and place him on that following the results of the MRI based on what we were seeing. I gave him that prescription for 14 days for the time being and we will go from there. 12/03/2020 upon evaluation today patient appears to be doing  decently well in regard to his wounds in general in regard to the right foot. With that being said he does have a callus on the left foot in the end there was actually an area open area here that will need to be addressed. This is small and I think we take care of it now hopefully will heal quite readily. Fortunately I do not see any signs of infection worsening I am getting need to refill the antibiotic for him today. He has been taking doxycycline which seems to be doing well. We will also get a go ahead and get him approved for HBO therapy. 12/31/2020 upon evaluation today patient appears to be doing really roughly the same as compared to last time I saw him. Fortunately there is no evidence of active infection which is great news systemically he has started hyperbarics he seems to be doing decently well he said a couple times where anxiety kind of got to him but overall I think he is doing awesome. He is no longer on the antibiotic therapy. With that being said I do believe that he is going to need likely some additional antibiotics based on what I am seeing today we can actually obtain a bone culture from the plantar wound that was hiding underneath the callus. He was having pain in this area which is what may be try to clear away the callus to look sure enough there was an actual opening that went deeper into the wound bed region here. 01/21/2021 upon evaluation today patient appears to be doing about the same in regard to his foot in general. He does have some depth to some of the wound areas I really think he would benefit from is trying to see about getting him set up for a gauze VAC. I think that the gauze VAC could be helpful for him and will work much more effectively with what we are seeing as compared to traditional foam wound VAC. The patient is in agreement with giving this a trial. Nonetheless unfortunately think that he overall seems to be doing really well and I think we are headed in  the  right direction as far as the overall healing is concerned. 01/28/2021 upon evaluation today patient appears to be doing well with regard to his wound all things considered he does have a lot of callus buildup especially on the plantar aspect of the left foot. There is no wound here but a significant amount of callus. That is getting need to be addressed today. Subsequently he also has a lot of callus on the right foot as well as some of the area around the wounds actually being significantly callused. Again I need to clear this away today. 02/04/2021 upon evaluation today patient actually seems to be doing quite well with regard to his foot ulcer. I do believe that the hyperbaric oxygen therapy has been beneficial for him thus far also think that he is doing well with the wound VAC. In combination I think he has an excellent plan at this time as far as treatment is concerned. I do not see any signs of evidence of infection which is great news as well. 02/11/2021 upon evaluation today patient appears to be doing okay in regard to his foot ulcer though still has deep wounds that do not seem to be doing nearly as good as what I would like to see. I think that the wound VAC is helping but I do believe there still bone in the base of the wound I think we probably need to see about put him back on an antibiotic, do a PCR culture today to see what we find in order to determine whether or not we need to have him on antibiotics which I think we probably do is just a matter which 1. 02/18/2021 upon evaluation today patient appears to be doing okay with regard to his foot ulcer. He has been using the wound VAC although there is a lot of drainage and to be honest I am not certain this is doing as well as we wanted to right now especially considering the fact the soles of gotten so small which in some ways is good but in other ways is still problematic for Sorbact standpoint. I am not even certain we will be  able to reinitiate the VAC even next week. Nonetheless for now he is on the [place] which is definitely something that we need to get going I am also can see about a referral back to infectious disease to see what they have to say in regard to what is going on currently as well. He voiced understanding and is in agreement with that plan. I did explain this is a very touchy go situation which hinges on [place] getting this healed and the infection improved if not he is looking at amputation. Obviously this is a limb salvage Plan at this point. Objective Constitutional Well-nourished and well-hydrated in no acute distress. Vitals Time Taken: 11:19 AM, Height: 65 in, Weight: 196 lbs, BMI: 32.6, Temperature: 97.5 F, Pulse: 97 bpm, Respiratory Rate: 20 breaths/min, Blood Pressure: 142/96 mmHg, Capillary Blood Glucose: 140 mg/dl. Respiratory normal breathing without difficulty. Psychiatric this patient is able to make decisions and demonstrates good insight into disease process. Alert and Oriented x 3. pleasant and cooperative. General Notes: Upon inspection patient's wounds do show signs of significant improvement in some ways although he still has a lot of drainage which is somewhat concerning. Fortunately there is no sign of active infection locally nor systemically at this point. No fevers, chills, nausea, vomiting, or diarrhea. Integumentary (Hair, Skin) Wound #3 status is Open. Original cause of  wound was Gradually Appeared. The date acquired was: 10/29/2020. The wound has been in treatment 16 weeks. The wound is located on the Right,Proximal,Lateral Foot. The wound measures 0.5cm length x 0.4cm width x 0.7cm depth; 0.157cm^2 area and 0.11cm^3 volume. There is Fat Layer (Subcutaneous Tissue) exposed. There is no undermining noted, however, there is tunneling at 12:00 with a maximum distance of 1.3cm. There is a large amount of purulent drainage noted. The wound margin is distinct with the outline  attached to the wound base. There is large (67-100%) red granulation within the wound bed. There is no necrotic tissue within the wound bed. General Notes: macerated periwound. Wound #7 status is Open. Original cause of wound was Gradually Appeared. The date acquired was: 12/31/2020. The wound has been in treatment 7 weeks. The wound is located on the [place]. The wound measures 0.5cm length x 0.5cm width x 1.7cm depth; 0.196cm^2 area and 0.334cm^3 volume. There is Fat Layer (Subcutaneous Tissue) exposed. There is no tunneling or undermining noted. There is a medium amount of serosanguineous drainage noted. The wound margin is distinct with the outline attached to the wound base. There is large (67-100%) red granulation within the wound bed. There is no necrotic tissue within the wound bed. General Notes: macerated periwound. Assessment Active Problems ICD-10 Other chronic osteomyelitis, right ankle and foot Type 2 diabetes mellitus with foot ulcer Non-pressure chronic ulcer of other part of right foot with necrosis of bone Disruption of external operation (surgical) wound, not elsewhere classified, sequela Nicotine dependence, cigarettes, with other nicotine-induced disorders Non-pressure chronic ulcer of other part of left foot with fat layer exposed Corns and callosities Plan 1. Would recommend at this point that we have the patient go ahead and continue with the [place] which is good I am glad that he has this. 2. I am also can recommend that we have the patient go ahead and continue to monitor for any signs of worsening we will pack with iodoform gauze at this point he will change the outer dressing but not the gauze packing since he cannot get to that area. 3. I am also going to recommend that we have the patient continue to monitor for worsening in general if he develops any fevers, chills, nausea, vomiting, or diarrhea he should go to the hospital ASAP. We will see  patient back for reevaluation in 1 week here in the clinic. If anything worsens or changes patient will contact our office for additional recommendations. Electronic Signature(s) Signed: 02/18/2021 11:49:42 AM By: Worthy Keeler PA-C Entered By: Worthy Keeler on 02/18/2021 11:49:41 -------------------------------------------------------------------------------- SuperBill Details Patient Name: Date of Service: [name], [name] 02/18/2021 Medical Record Number: [id number] Patient Account Number: [id number] Date of Birth/Sex: Treating RN: 10/23/1970 (50 y.o. [name] Primary Care Provider: [name] Other Clinician: Referring Provider: Treating Provider/Extender: [name], [name] in Treatment: 23 Diagnosis Coding ICD-10 Codes Code Description [phone] Other chronic osteomyelitis, right ankle and foot E11.621 Type 2 diabetes mellitus with foot ulcer L97.514 Non-pressure chronic ulcer of other part of right foot with necrosis of bone T81.31XS Disruption of external operation (surgical) wound, not elsewhere classified, sequela F17.218 Nicotine dependence, cigarettes, with other nicotine-induced disorders L97.522 Non-pressure chronic ulcer of other part of left foot with fat layer exposed L84 Corns and callosities Physician Procedures : CPT4 Code Description Modifier [id number] - WC PHYS LEVEL 4 - EST PT ICD-10 Diagnosis Description M86.671 Other chronic osteomyelitis, right ankle and foot E11.621 Type  2 diabetes mellitus with foot ulcer L97.514 Non-pressure chronic ulcer of other  part of right foot with necrosis of bone T81.31XS Disruption of external operation (surgical) wound, not elsewhere classified, sequela Quantity: 1 Electronic Signature(s) Signed: 02/18/2021 11:50:08 AM By: Worthy Keeler PA-C Entered By: Worthy Keeler on 02/18/2021 11:50:05

## 2021-02-23 ENCOUNTER — Other Ambulatory Visit: Payer: Self-pay

## 2021-02-23 ENCOUNTER — Encounter (HOSPITAL_BASED_OUTPATIENT_CLINIC_OR_DEPARTMENT_OTHER): Payer: 59 | Admitting: Internal Medicine

## 2021-02-23 DIAGNOSIS — M86671 Other chronic osteomyelitis, right ankle and foot: Secondary | ICD-10-CM | POA: Diagnosis not present

## 2021-02-23 DIAGNOSIS — E11621 Type 2 diabetes mellitus with foot ulcer: Secondary | ICD-10-CM | POA: Diagnosis not present

## 2021-02-23 DIAGNOSIS — L97514 Non-pressure chronic ulcer of other part of right foot with necrosis of bone: Secondary | ICD-10-CM | POA: Diagnosis not present

## 2021-02-23 LAB — GLUCOSE, CAPILLARY
Glucose-Capillary: 135 mg/dL — ABNORMAL HIGH (ref 70–99)
Glucose-Capillary: 152 mg/dL — ABNORMAL HIGH (ref 70–99)

## 2021-02-23 NOTE — Progress Notes (Signed)
[name], HY ([id number]) Visit Report for 02/23/2021 SuperBill Details Patient Name: Date of Service: [address] [address], [name] 02/23/2021 Medical Record Number: [id number] Patient Account Number: [id number] Date of Birth/Sex: Treating RN: 1971-02-12 (50 y.o. [name], [name] Primary Care Provider: [name] Other Clinician: [name] Referring Provider: Treating Provider/Extender: [name], [name] in Treatment: 24 Diagnosis Coding ICD-10 Codes Code Description [phone] Other chronic osteomyelitis, right ankle and foot E11.621 Type 2 diabetes mellitus with foot ulcer L97.514 Non-pressure chronic ulcer of other part of right foot with necrosis of bone T81.31XS Disruption of external operation (surgical) wound, not elsewhere classified, sequela F17.218 Nicotine dependence, cigarettes, with other nicotine-induced disorders L97.522 Non-pressure chronic ulcer of other part of left foot with fat layer exposed L84 Corns and callosities Facility Procedures CPT4 Code Description Modifier Quantity [id number] G0277-(Facility Use Only) HBOT full body chamber, 71min , 4 ICD-10 Diagnosis Description M86.671 Other chronic osteomyelitis, right ankle and foot E11.621 Type 2 diabetes mellitus with foot ulcer L97.514 Non-pressure chronic ulcer of other part of right foot with necrosis of bone Physician Procedures Quantity CPT4 Code Description Modifier [id number] 97026 - WC PHYS HYPERBARIC OXYGEN THERAPY 1 ICD-10 Diagnosis Description M86.671 Other chronic osteomyelitis, right ankle and foot Electronic Signature(s) Signed: 02/23/2021 12:11:10 PM By: Kalman Shan DO Signed: 02/23/2021 12:51:57 PM By: [name] [name] , , Entered By: [name] on 02/23/2021 11:11:01

## 2021-02-23 NOTE — Progress Notes (Addendum)
[name] ([id number]) Visit Report for 02/23/2021 HBO Details Patient Name: Date of Service: [place] [name], [name] RCUS [name]. 02/23/2021 8:00 A M Medical Record Number: [id number] Patient Account Number: [id number] Date of Birth/Sex: Treating RN: [birth year] (50 y.o. [name], [name] Primary Care Nikelle Malatesta: [name] Other Clinician: [name] Referring Brytney Somes: Treating Raegan Winders/Extender: [name], [name] in Treatment: 24 HBO Treatment Course Details Treatment Course Number: 1 Ordering Lavell Ridings: [name] Treatments Ordered: otal 40 HBO Treatment Start Date: 12/08/2020 HBO Indication: Diabetic Ulcer(s) of the Lower Extremity HBO Treatment Details Treatment Number: 37 Patient Type: Outpatient Chamber Type: Monoplace Chamber Serial #: [id number] Treatment Protocol: 2.5 ATA with 90 minutes oxygen, with two 5 minute air breaks Treatment Details Compression Rate Down: 2.0 psi / minute De-Compression Rate Up: 2.0 psi / minute A breaks and breathing ir Compress Tx Pressure periods Decompress Decompress Begins Reached (leave unused spaces Begins Ends blank) Chamber Pressure (ATA 1 2.5 2.5 2.5 2.5 2.5 - - 2.5 1 ) Clock Time (24 hr) 08:28 08:39 09:09 09:14 09:44 09:49 - - 10:19 10:30 Treatment Length: 122 (minutes) Treatment Segments: 4 Vital Signs Capillary Blood Glucose Reference Range: 80 - 120 mg / dl HBO Diabetic Blood Glucose Intervention Range: <131 mg/dl or >249 mg/dl Time Vitals Blood Respiratory Capillary Blood Glucose Pulse Action Type: Pulse: Temperature: Taken: Pressure: Rate: Glucose (mg/dl): Meter #: Oximetry (%) Taken: Pre 08:19 170/99 105 20 98.8 135 2 Post 10:31 152/97 92 20 97.8 152 2 Treatment Response Treatment Toleration: Well Treatment Completion Status: Treatment Completed without Adverse Event Additional Procedure Documentation Tissue Sevierity: Fat layer exposed Physician HBO Attestation: I certify that I  supervised this HBO treatment in accordance with Medicare guidelines. A trained emergency response team is readily available per Yes hospital policies and procedures. Continue HBOT as ordered. Yes Electronic Signature(s) Signed: 02/23/2021 12:11:10 PM By: Kalman Shan DO Previous Signature: 02/23/2021 11:18:24 AM Version By: [name] [name] , , Previous Signature: 02/23/2021 10:57:20 AM Version By: [name] [name] , , Entered By: Kalman Shan on 02/23/2021 12:04:40 -------------------------------------------------------------------------------- HBO Safety Checklist Details Patient Name: Date of Service: [name], [name]. 02/23/2021 8:00 A M Medical Record Number: [id number] Patient Account Number: [id number] Date of Birth/Sex: Treating RN: [birth year] (50 y.o. [name], [name] Primary Care Raymonda Pell: [name] Other Clinician: [name] Referring Samnang Shugars: Treating Serenidy Waltz/Extender: [name], [name] in Treatment: 24 HBO Safety Checklist Items Safety Checklist Consent Form Signed Patient voided / foley secured and emptied When did you last eato 0825 Last dose of injectable or oral agent 0825 Ostomy pouch emptied and vented if applicable NA All implantable devices assessed, documented and approved NA Intravenous access site secured and place NA Valuables secured Linens and cotton and cotton/polyester blend (less than 51% polyester) Personal oil-based products / skin lotions / body lotions removed Wigs or hairpieces removed NA Smoking or tobacco materials removed Books / newspapers / magazines / loose paper removed Cologne, aftershave, perfume and deodorant removed Jewelry removed (may wrap wedding band) Make-up removed NA Hair care products removed Battery operated devices (external) removed Heating patches and chemical warmers removed Titanium eyewear removed NA Nail polish cured greater than 10  hours NA Casting material cured greater than 10 hours NA Hearing aids removed NA Loose dentures or partials removed NA Prosthetics have been removed NA Patient demonstrates correct use of air break device (if applicable) Patient concerns have been addressed Patient grounding bracelet on and cord attached  to chamber Specifics for Inpatients (complete in addition to above) Medication sheet sent with patient NA Intravenous medications needed or due during therapy sent with patient NA Drainage tubes (e.g. nasogastric tube or chest tube secured and vented) NA Endotracheal or Tracheotomy tube secured NA Cuff deflated of air and inflated with saline NA Airway suctioned NA Notes Paper version used prior to treatment. Electronic Signature(s) Signed: 02/23/2021 11:17:14 AM By: [name] [name] , , Previous Signature: 02/23/2021 10:04:38 AM Version By: [name] [name] , , Entered By: [name] on 02/23/2021 11:17:13

## 2021-02-23 NOTE — Progress Notes (Signed)
[name], [name] ([id number]) Visit Report for 02/23/2021 SuperBill Details Patient Name: Date of Service: [place] [address], [name] 02/23/2021 Medical Record Number: [id number] Patient Account Number: [id number] Date of Birth/Sex: Treating RN: 02/17/1971 (50 y.o. [name] Primary Care Provider: [name] Other Clinician: Referring Provider: Treating Provider/Extender: [name], [name] in Treatment: 24 Diagnosis Coding ICD-10 Codes Code Description [phone] Other chronic osteomyelitis, right ankle and foot E11.621 Type 2 diabetes mellitus with foot ulcer L97.514 Non-pressure chronic ulcer of other part of right foot with necrosis of bone T81.31XS Disruption of external operation (surgical) wound, not elsewhere classified, sequela F17.218 Nicotine dependence, cigarettes, with other nicotine-induced disorders L97.522 Non-pressure chronic ulcer of other part of left foot with fat layer exposed L84 Corns and callosities Facility Procedures CPT4 Code Description Modifier Quantity [id number] 99214 - WOUND CARE VISIT-LEV 4 EST PT 1 Electronic Signature(s) Signed: 02/23/2021 12:11:10 PM By: Kalman Shan DO Signed: 02/23/2021 6:26:42 PM By: [name] RN, BSN Entered By: [name] on 02/23/2021 08:00:29

## 2021-02-23 NOTE — Progress Notes (Addendum)
[name], [name] ([id number]) Visit Report for 02/23/2021 Arrival Information Details Patient Name: Date of Service: [name], [name] 02/23/2021 8:00 A M Medical Record Number: [id number] Patient Account Number: [id number] Date of Birth/Sex: Treating RN: 12/05/1970 (49 y.o. [name], [name] Primary Care [name]: [name] Other Clinician: Donavan [name] Referring [name]: Treating [name] Jun/Extender: [name], [name] in Treatment: 24 Visit Information History Since Last Visit All ordered tests and consults were completed: Yes Patient Arrived: Cane Added or deleted any medications: No Arrival Time: 07:43 Any new allergies or adverse reactions: No Accompanied By: self Had a fall or experienced change in No Transfer Assistance: None activities of daily living that may affect Patient Identification Verified: Yes risk of falls: Secondary Verification Process Completed: Yes Signs or symptoms of abuse/neglect since last visito No Patient Requires Transmission-Based Precautions: No Hospitalized since last visit: No Patient Has Alerts: No Implantable device outside of the clinic excluding No cellular tissue based products placed in the center since last visit: Pain Present Now: No Electronic Signature(s) Signed: 02/23/2021 9:26:45 AM By: [name] [name] , , Entered By: [name] on 02/23/2021 09:26:45 -------------------------------------------------------------------------------- Encounter Discharge Information Details Patient Name: Date of Service: [name] RD, [name] 02/23/2021 8:00 A M Medical Record Number: [id number] Patient Account Number: [id number] Date of Birth/Sex: Treating RN: 1970-06-20 (50 y.o. [name] Primary Care [name]: [name] Other Clinician: Donavan [name] Referring [name]: Treating [name]/Extender: [name] in Treatment: 24 Encounter Discharge  Information Items Discharge Condition: Stable Ambulatory Status: Cane Discharge Destination: Other (Note Required) Transportation: Private Auto Accompanied By: self Schedule Follow-up Appointment: No Clinical Summary of Care: Notes Patient states he has another doctor appointment. Electronic Signature(s) Signed: 02/23/2021 11:12:10 AM By: [name] [name] , , Entered By: [name] on 02/23/2021 11:12:10 -------------------------------------------------------------------------------- Vitals Details Patient Name: Date of Service: [name] RD, [name] 02/23/2021 8:00 A M Medical Record Number: [id number] Patient Account Number: [id number] Date of Birth/Sex: Treating RN: May 21, 1970 (50 y.o. [name], [name] Primary Care [name]: [name] Other Clinician: Donavan [name] Referring [name]: Treating [name]/Extender: [name], [name] in Treatment: 24 Vital Signs Time Taken: 08:19 Temperature (F): 98.8 Height (in): 65 Pulse (bpm): 105 Weight (lbs): 196 Respiratory Rate (breaths/min): 20 Body Mass Index (BMI): 32.6 Blood Pressure (mmHg): 170/99 Capillary Blood Glucose (mg/dl): 135 Reference Range: 80 - 120 mg / dl Electronic Signature(s) Signed: 02/23/2021 10:02:37 AM By: [name] [name] , , Entered By: [name] on 02/23/2021 10:02:37

## 2021-02-23 NOTE — Progress Notes (Signed)
DONTRAVIOUS, CAMILLE ([id number]) Visit Report for 02/23/2021 Arrival Information Details Patient Name: Date of Service: [place] [address], [name] 02/23/2021 10:30 A M Medical Record Number: [id number] Patient Account Number: [id number] Date of Birth/Sex: Treating RN: 1970/09/10 (50 y.o. [name], [name] Primary Care Aniqa Hare: [name] Other Clinician: Referring Evangaline Jou: Treating Kaidance Pantoja/Extender: [name], [name] in Treatment: 24 Visit Information History Since Last Visit Added or deleted any medications: No Patient Arrived: [name] Any new allergies or adverse reactions: No Arrival Time: 07:57 Had a fall or experienced change in No Accompanied By: self activities of daily living that may affect Transfer Assistance: None risk of falls: Patient Identification Verified: Yes Signs or symptoms of abuse/neglect since last visito No Secondary Verification Process Completed: Yes Hospitalized since last visit: No Patient Requires Transmission-Based Precautions: No Implantable device outside of the clinic excluding No Patient Has Alerts: No cellular tissue based products placed in the center since last visit: Has Dressing in Place as Prescribed: Yes Pain Present Now: No Electronic Signature(s) Signed: 02/23/2021 6:26:42 PM By: [name] RN, BSN Entered By: [name] on 02/23/2021 07:58:30 -------------------------------------------------------------------------------- Clinic Level of Care Assessment Details Patient Name: Date of Service: [name], [place]. 02/23/2021 10:30 A M Medical Record Number: [id number] Patient Account Number: [id number] Date of Birth/Sex: Treating RN: [birth year] (50 y.o. [name] Primary Care Oather Muilenburg: [name] Other Clinician: Referring Caffie Sotto: Treating Tobie Hellen/Extender: [name], [name] in Treatment: 24 Clinic Level of Care Assessment Items TOOL 4 Quantity Score X- 1 0 Use when only an  EandM is performed on FOLLOW-UP visit ASSESSMENTS - Nursing Assessment / Reassessment X- 1 10 Reassessment of Co-morbidities (includes updates in patient status) X- 1 5 Reassessment of Adherence to Treatment Plan ASSESSMENTS - Wound and Skin A ssessment / Reassessment []  - 0 Simple Wound Assessment / Reassessment - one wound X- 2 5 Complex Wound Assessment / Reassessment - multiple wounds X- 1 10 Dermatologic / Skin Assessment (not related to wound area) ASSESSMENTS - Focused Assessment []  - 0 Circumferential Edema Measurements - multi extremities X- 1 10 Nutritional Assessment / Counseling / Intervention []  - 0 Lower Extremity Assessment (monofilament, tuning fork, pulses) []  - 0 Peripheral Arterial Disease Assessment (using hand held doppler) ASSESSMENTS - Ostomy and/or Continence Assessment and Care []  - 0 Incontinence Assessment and Management []  - 0 Ostomy Care Assessment and Management (repouching, etc.) PROCESS - Coordination of Care []  - 0 Simple Patient / Family Education for ongoing care X- 1 20 Complex (extensive) Patient / Family Education for ongoing care X- 1 10 Staff obtains Programmer, systems, Records, T Results / Process Orders est []  - 0 Staff telephones HHA, Nursing Homes / Clarify orders / etc []  - 0 Routine Transfer to another Facility (non-emergent condition) []  - 0 Routine Hospital Admission (non-emergent condition) []  - 0 New Admissions / Biomedical engineer / Ordering NPWT Apligraf, etc. , []  - 0 Emergency Hospital Admission (emergent condition) []  - 0 Simple Discharge Coordination X- 1 15 Complex (extensive) Discharge Coordination PROCESS - Special Needs []  - 0 Pediatric / Minor Patient Management []  - 0 Isolation Patient Management []  - 0 Hearing / Language / Visual special needs []  - 0 Assessment of Community assistance (transportation, D/C planning, etc.) []  - 0 Additional assistance / Altered mentation []  - 0 Support Surface(s)  Assessment (bed, cushion, seat, etc.) INTERVENTIONS - Wound Cleansing / Measurement []  - 0 Simple Wound Cleansing - one wound X- 2 5 Complex Wound Cleansing - multiple  wounds []  - 0 Wound Imaging (photographs - any number of wounds) []  - 0 Wound Tracing (instead of photographs) []  - 0 Simple Wound Measurement - one wound X- 2 5 Complex Wound Measurement - multiple wounds INTERVENTIONS - Wound Dressings []  - 0 Small Wound Dressing one or multiple wounds X- 1 15 Medium Wound Dressing one or multiple wounds []  - 0 Large Wound Dressing one or multiple wounds []  - 0 Application of Medications - topical []  - 0 Application of Medications - injection INTERVENTIONS - Miscellaneous []  - 0 External ear exam []  - 0 Specimen Collection (cultures, biopsies, blood, body fluids, etc.) []  - 0 Specimen(s) / Culture(s) sent or taken to Lab for analysis []  - 0 Patient Transfer (multiple staff / Harrel Lemon Lift / Similar devices) []  - 0 Simple Staple / Suture removal (25 or less) []  - 0 Complex Staple / Suture removal (26 or more) []  - 0 Hypo / Hyperglycemic Management (close monitor of Blood Glucose) []  - 0 Ankle / Brachial Index (ABI) - do not check if billed separately X- 1 5 Vital Signs Has the patient been seen at the hospital within the last three years: Yes Total Score: 130 Level Of Care: New/Established - Level 4 Electronic Signature(s) Signed: 02/23/2021 6:26:42 PM By: [name] RN, BSN Entered By: [name] on 02/23/2021 08:00:23 -------------------------------------------------------------------------------- Encounter Discharge Information Details Patient Name: Date of Service: [name], [name] 02/23/2021 10:30 A M Medical Record Number: [id number] Patient Account Number: [id number] Date of Birth/Sex: Treating RN: [birth year] (50 y.o. [name] Primary Care Kosei Rhodes: [name] Other Clinician: Referring Tyquavious Gamel: Treating Tyus Kallam/Extender: [name], [name] in Treatment: 24 Encounter Discharge Information Items Discharge Condition: Stable Ambulatory Status: Cane Discharge Destination: Home Transportation: Private Auto Accompanied By: self Schedule Follow-up Appointment: Yes Clinical Summary of Care: Electronic Signature(s) Signed: 02/23/2021 6:26:42 PM By: [name] RN, BSN Entered By: [name] on 02/23/2021 07:59:48 -------------------------------------------------------------------------------- Patient/Caregiver Education Details Patient Name: Date of Service: [name], [name] 11/28/2022andnbsp10:30 A M Medical Record Number: [id number] Patient Account Number: [id number] Date of Birth/Gender: Treating RN: 05/11/1970 (50 y.o. [name] Primary Care Physician: [name] Other Clinician: Referring Physician: Treating Physician/Extender: [name] Weeks in Treatment: 24 Education Assessment Education Provided To: Patient Education Topics Provided Wound/Skin Impairment: Handouts: Skin Care Do's and Dont's Methods: Explain/Verbal Responses: Reinforcements needed Electronic Signature(s) Signed: 02/23/2021 6:26:42 PM By: [name] RN, BSN Entered By: [name] on 02/23/2021 07:59:32 -------------------------------------------------------------------------------- Wound Assessment Details Patient Name: Date of Service: [name], [place] [name] 02/23/2021 10:30 A M Medical Record Number: [id number] Patient Account Number: [id number] Date of Birth/Sex: Treating RN: 08-Dec-1970 (50 y.o. [name] Primary Care Lavon Bothwell: [name] Other Clinician: Referring Kyllie Pettijohn: Treating Albertia Carvin/Extender: [name], [name] in Treatment: 24 Wound Status Wound Number: 3 Primary Etiology: Diabetic Wound/Ulcer of the Lower Extremity Wound Location: Right, Proximal, Lateral Foot Wound Status: Open Wounding Event: Gradually Appeared Date  Acquired: 10/29/2020 Weeks Of Treatment: 16 Clustered Wound: No Wound Measurements Length: (cm) 0.5 Width: (cm) 0.4 Depth: (cm) 0.7 Area: (cm) 0.157 Volume: (cm) 0.11 % Reduction in Area: -24.6% % Reduction in Volume: -25% Wound Description Classification: Grade 3 Exudate Amount: Large Exudate Type: Purulent Exudate Color: yellow, brown, green Treatment Notes Wound #3 (Foot) Wound Laterality: Right, Lateral, Proximal Cleanser Soap and Water Discharge Instruction: May shower and wash wound with dial antibacterial soap and water prior to dressing change. Wound Cleanser Discharge Instruction: Cleanse  the wound with wound cleanser prior to applying a clean dressing using gauze sponges, not tissue or cotton balls. Peri-Wound Care Sween Lotion (Moisturizing lotion) Discharge Instruction: Apply moisturizing lotion to foot with dressing changes Topical Primary Dressing Iodoform packing strip 1/4 (in) Discharge Instruction: Lightly pack as instructed Secondary Dressing Woven Gauze Sponge, Non-Sterile 4x4 in Discharge Instruction: Apply over primary dressing as directed. ABD Pad, 5x9 Discharge Instruction: Apply over primary dressing as directed. Secured With [employer] 4x5 (in/yd) Discharge Instruction: Secure with Coban as directed. Kerlix Roll Sterile, 4.5x3.1 (in/yd) Discharge Instruction: Secure with Kerlix as directed. Compression Wrap Compression Stockings Add-Ons Electronic Signature(s) Signed: 02/23/2021 6:26:42 PM By: [name] RN, BSN Entered By: [name] on 02/23/2021 07:59:00 -------------------------------------------------------------------------------- Wound Assessment Details Patient Name: Date of Service: [name], [place] [name] 02/23/2021 10:30 A M Medical Record Number: [id number] Patient Account Number: [id number] Date of Birth/Sex: Treating RN: 1970-07-22 (50 y.o. [name] Primary Care Taiylor Virden: [name] Other  Clinician: Referring Darrion Macaulay: Treating Diane Hanel/Extender: [name], [name] in Treatment: 24 Wound Status Wound Number: 7 Primary Etiology: Diabetic Wound/Ulcer of the Lower Extremity Wound Location: Right, Lateral, Plantar Foot Wound Status: Open Wounding Event: Gradually Appeared Date Acquired: 12/31/2020 Weeks Of Treatment: 7 Clustered Wound: No Wound Measurements Length: (cm) 0.5 Width: (cm) 0.5 Depth: (cm) 1.7 Area: (cm) 0.196 Volume: (cm) 0.334 % Reduction in Area: 0% % Reduction in Volume: -70.4% Wound Description Classification: Grade 3 Exudate Amount: Medium Exudate Type: Serosanguineous Exudate Color: red, brown Treatment Notes Wound #7 (Foot) Wound Laterality: Plantar, Right, Lateral Cleanser Soap and Water Discharge Instruction: May shower and wash wound with dial antibacterial soap and water prior to dressing change. Wound Cleanser Discharge Instruction: Cleanse the wound with wound cleanser prior to applying a clean dressing using gauze sponges, not tissue or cotton balls. Peri-Wound Care Sween Lotion (Moisturizing lotion) Discharge Instruction: Apply moisturizing lotion to foot with dressing changes Topical Primary Dressing Iodoform packing strip 1/4 (in) Discharge Instruction: Lightly pack as instructed Secondary Dressing Woven Gauze Sponge, Non-Sterile 4x4 in Discharge Instruction: Apply over primary dressing as directed. ABD Pad, 5x9 Discharge Instruction: Apply over primary dressing as directed. Secured With [employer] 4x5 (in/yd) Discharge Instruction: Secure with Coban as directed. Kerlix Roll Sterile, 4.5x3.1 (in/yd) Discharge Instruction: Secure with Kerlix as directed. Compression Wrap Compression Stockings Add-Ons Electronic Signature(s) Signed: 02/23/2021 6:26:42 PM By: [name] RN, BSN Entered By: [name] on 02/23/2021  07:59:00 -------------------------------------------------------------------------------- Vitals Details Patient Name: Date of Service: [name], [name] 02/23/2021 10:30 A M Medical Record Number: [id number] Patient Account Number: [id number] Date of Birth/Sex: Treating RN: 1970/10/28 (50 y.o. [name] Primary Care Owain Eckerman: [name] Other Clinician: Referring Kashae Carstens: Treating Anders Hohmann/Extender: [name], [name] in Treatment: 24 Vital Signs Time Taken: 07:55 Temperature (F): 98.8 Height (in): 65 Pulse (bpm): 105 Weight (lbs): 196 Respiratory Rate (breaths/min): 20 Body Mass Index (BMI): 32.6 Blood Pressure (mmHg): 170/99 Reference Range: 80 - 120 mg / dl Electronic Signature(s) Signed: 02/23/2021 6:26:42 PM By: [name] RN, BSN Entered By: [name] on 02/23/2021 07:58:48

## 2021-02-24 ENCOUNTER — Encounter (HOSPITAL_BASED_OUTPATIENT_CLINIC_OR_DEPARTMENT_OTHER): Payer: 59 | Admitting: Internal Medicine

## 2021-02-24 DIAGNOSIS — E11621 Type 2 diabetes mellitus with foot ulcer: Secondary | ICD-10-CM | POA: Diagnosis not present

## 2021-02-24 LAB — GLUCOSE, CAPILLARY: Glucose-Capillary: 141 mg/dL — ABNORMAL HIGH (ref 70–99)

## 2021-02-24 NOTE — Progress Notes (Signed)
MAKYI, LEDO ([id number]) Visit Report for 02/24/2021 SuperBill Details Patient Name: Date of Service: [place] RD, [name] 02/24/2021 Medical Record Number: [id number] Patient Account Number: [id number] Date of Birth/Sex: Treating RN: 30-May-1970 (50 y.o. [name] Primary Care Provider: [name] Other Clinician: [name] Referring Provider: Treating Provider/Extender: [name], [name] in Treatment: 24 Diagnosis Coding ICD-10 Codes Code Description [phone] Other chronic osteomyelitis, right ankle and foot E11.621 Type 2 diabetes mellitus with foot ulcer L97.514 Non-pressure chronic ulcer of other part of right foot with necrosis of bone T81.31XS Disruption of external operation (surgical) wound, not elsewhere classified, sequela F17.218 Nicotine dependence, cigarettes, with other nicotine-induced disorders L97.522 Non-pressure chronic ulcer of other part of left foot with fat layer exposed L84 Corns and callosities Facility Procedures CPT4 Code Description Modifier Quantity [id number]-(Facility Use Only) HBOT full body chamber, 30min , 4 ICD-10 Diagnosis Description M86.671 Other chronic osteomyelitis, right ankle and foot E11.621 Type 2 diabetes mellitus with foot ulcer L97.514 Non-pressure chronic ulcer of other part of right foot with necrosis of bone Physician Procedures Quantity CPT4 Code Description Modifier [id number] 40814 - WC PHYS HYPERBARIC OXYGEN THERAPY 1 ICD-10 Diagnosis Description M86.671 Other chronic osteomyelitis, right ankle and foot E11.621 Type 2 diabetes mellitus with foot ulcer L97.514 Non-pressure chronic ulcer of other part of right foot with necrosis of bone Electronic Signature(s) Signed: 02/24/2021 2:58:20 PM By: [name] [name] , , Signed: 02/24/2021 5:06:47 PM By: [name] MD Entered By: [name] on 02/24/2021 14:58:18

## 2021-02-24 NOTE — Progress Notes (Addendum)
VONNIE, LIGMAN ([id number]) Visit Report for 02/24/2021 HBO Details Patient Name: Date of Service: [place] RD, [name] 02/24/2021 9:45 A M Medical Record Number: [id number] Patient Account Number: [id number] Date of Birth/Sex: Treating RN: 11-20-1970 (50 y.o. [name] Primary Care Eleora Sutherland: [name] Other Clinician: [name] Referring Jonie Burdell: Treating Roe Koffman/Extender: [name], [name] in Treatment: 24 HBO Treatment Course Details Treatment Course Number: 1 Ordering Nalany Steedley: [name] Treatments Ordered: otal 40 HBO Treatment Start Date: 12/08/2020 HBO Indication: Diabetic Ulcer(s) of the Lower Extremity HBO Treatment Details Treatment Number: 38 Patient Type: Outpatient Chamber Type: Monoplace Chamber Serial #: [id number] Treatment Protocol: 2.5 ATA with 90 minutes oxygen, with two 5 minute air breaks Treatment Details Compression Rate Down: 2.0 psi / minute De-Compression Rate Up: 2.0 psi / minute A breaks and breathing ir Compress Tx Pressure periods Decompress Decompress Begins Reached (leave unused spaces Begins Ends blank) Chamber Pressure (ATA 1 2.5 2.5 2.5 2.5 2.5 - - 2.5 1 ) Clock Time (24 hr) 12:36 12:47 13:17 13:22 13:52 13:57 - - 14:27 14:38 Treatment Length: 122 (minutes) Treatment Segments: 4 Vital Signs Capillary Blood Glucose Reference Range: 80 - 120 mg / dl HBO Diabetic Blood Glucose Intervention Range: <131 mg/dl or >249 mg/dl Type: Time Vitals Blood Pulse: Respiratory Temperature: Capillary Blood Glucose Pulse Action Taken: Pressure: Rate: Glucose (mg/dl): Meter #: Oximetry (%) Taken: Pre 14:40 152/95 91 16 97.4 141 Post 14:40 166/104 84 16 97.6 114 discharge per protocol >101 mg/dL Treatment Response Treatment Toleration: Well Treatment Completion Status: Treatment Completed without Adverse Event Additional Procedure Documentation Tissue Sevierity: Fat layer exposed Clema Skousen Notes No  concerns with treatment given Physician HBO Attestation: I certify that I supervised this HBO treatment in accordance with Medicare guidelines. A trained emergency response team is readily available per Yes hospital policies and procedures. Continue HBOT as ordered. Yes Electronic Signature(s) Signed: 02/24/2021 5:06:47 PM By: [name] MD Previous Signature: 02/24/2021 2:56:38 PM Version By: [name] [name] , , Entered By: [name] on 02/24/2021 17:02:31 -------------------------------------------------------------------------------- HBO Safety Checklist Details Patient Name: Date of Service: [name], [place] [name] 02/24/2021 9:45 A M Medical Record Number: [id number] Patient Account Number: [id number] Date of Birth/Sex: Treating RN: December 30, 1970 (50 y.o. [name] Primary Care Makell Cyr: [name] Other Clinician: [name] Referring Kyarah Enamorado: Treating Addilyne Backs/Extender: [name], [name] in Treatment: 24 HBO Safety Checklist Items Safety Checklist Consent Form Signed Patient voided / foley secured and emptied When did you last eato 1000 Last dose of injectable or oral agent 0630 Ostomy pouch emptied and vented if applicable NA All implantable devices assessed, documented and approved NA Intravenous access site secured and place NA Valuables secured Linens and cotton and cotton/polyester blend (less than 51% polyester) Personal oil-based products / skin lotions / body lotions removed Wigs or hairpieces removed NA Smoking or tobacco materials removed NA Books / newspapers / magazines / loose paper removed Cologne, aftershave, perfume and deodorant removed Jewelry removed (may wrap wedding band) Make-up removed NA Hair care products removed NA Battery operated devices (external) removed Heating patches and chemical warmers removed Titanium eyewear removed NA Nail polish cured greater than 10  hours NA Casting material cured greater than 10 hours NA Hearing aids removed NA Loose dentures or partials removed NA Prosthetics have been removed NA Patient demonstrates correct use of air break device (if applicable) Patient concerns have been addressed Patient grounding bracelet on and cord attached to chamber  Specifics for Inpatients (complete in addition to above) Medication sheet sent with patient NA Intravenous medications needed or due during therapy sent with patient NA Drainage tubes (e.g. nasogastric tube or chest tube secured and vented) NA Endotracheal or Tracheotomy tube secured NA Cuff deflated of air and inflated with saline NA Airway suctioned NA Electronic Signature(s) Signed: 02/24/2021 2:53:28 PM By: [name] [name] , , Entered By: [name] on 02/24/2021 14:53:27

## 2021-02-25 ENCOUNTER — Other Ambulatory Visit: Payer: Self-pay

## 2021-02-25 ENCOUNTER — Encounter (HOSPITAL_BASED_OUTPATIENT_CLINIC_OR_DEPARTMENT_OTHER): Payer: 59 | Admitting: Physician Assistant

## 2021-02-25 DIAGNOSIS — E11621 Type 2 diabetes mellitus with foot ulcer: Secondary | ICD-10-CM | POA: Diagnosis not present

## 2021-02-25 LAB — GLUCOSE, CAPILLARY
Glucose-Capillary: 114 mg/dL — ABNORMAL HIGH (ref 70–99)
Glucose-Capillary: 119 mg/dL — ABNORMAL HIGH (ref 70–99)
Glucose-Capillary: 130 mg/dL — ABNORMAL HIGH (ref 70–99)
Glucose-Capillary: 140 mg/dL — ABNORMAL HIGH (ref 70–99)

## 2021-02-25 NOTE — Progress Notes (Addendum)
[name], [name] ([id number]) Visit Report for 02/25/2021 HBO Details Patient Name: Date of Service: [place] RD, [name] 02/25/2021 8:45 A M Medical Record Number: [id number] Patient Account Number: [id number] Date of Birth/Sex: Treating RN: [birth year] (50 y.o. [name], [name] Primary Care [name]: [name] Other Clinician: Donavan [name] Referring [name]: Treating [name]/Extender: [name], [name] in Treatment: 24 HBO Treatment Course Details Treatment Course Number: 1 Ordering [name]: [name] T Treatments Ordered: otal 40 HBO Treatment Start Date: 12/08/2020 HBO Indication: Diabetic Ulcer(s) of the Lower Extremity HBO Treatment Details Treatment Number: 39 Patient Type: Outpatient Chamber Type: Monoplace Chamber Serial #: [id number] Treatment Protocol: 2.5 ATA with 90 minutes oxygen, with two 5 minute air breaks Treatment Details Compression Rate Down: 2.0 psi / minute De-Compression Rate Up: 2.0 psi / minute A breaks and breathing ir Compress Tx Pressure periods Decompress Decompress Begins Reached (leave unused spaces Begins Ends blank) Chamber Pressure (ATA 1 2.5 2.5 2.5 2.5 2.5 - - 2.5 1 ) Clock Time (24 hr) 09:33 09:44 10:14 10:19 10:49 10:54 - - 11:24 11:35 Treatment Length: 122 (minutes) Treatment Segments: 4 Vital Signs Capillary Blood Glucose Reference Range: 80 - 120 mg / dl HBO Diabetic Blood Glucose Intervention Range: <131 mg/dl or >249 mg/dl Type: Time Vitals Blood Pulse: Respiratory Temperature: Capillary Blood Glucose Pulse Action Taken: Pressure: Rate: Glucose (mg/dl): Meter #: Oximetry (%) Taken: Pre 09:06 139/84 102 16 98.3 119 8 oz Glucerna Shake given Post 11:43 134/93 90 16 97.6 140 Pre 09:30 119 > ,= 110 mg/dl, proceed with HBO per protocol Treatment Response Treatment Toleration: Well Treatment Completion Status: Treatment Completed without Adverse Event Additional Procedure Documentation Tissue  Sevierity: Fat layer exposed Electronic Signature(s) Signed: 02/25/2021 12:40:32 PM By: [name] [name] , , Signed: 02/25/2021 6:11:30 PM By: Joseph Keeler PA-C Entered By: [name] on 02/25/2021 12:40:32 -------------------------------------------------------------------------------- HBO Safety Checklist Details Patient Name: Date of Service: [name] RD, [name] 02/25/2021 8:45 A M Medical Record Number: [id number] Patient Account Number: [id number] Date of Birth/Sex: Treating RN: December 02, 1970 (50 y.o. [name], [name] Primary Care [name]: [name] Other Clinician: Donavan [name] Referring [name]: Treating [name]/Extender: [name], [name] in Treatment: 24 HBO Safety Checklist Items Safety Checklist Consent Form Signed Patient voided / foley secured and emptied When did you last eato 0700 Last dose of injectable or oral agent 0700 Ostomy pouch emptied and vented if applicable NA All implantable devices assessed, documented and approved NA Intravenous access site secured and place NA Valuables secured Linens and cotton and cotton/polyester blend (less than 51% polyester) Personal oil-based products / skin lotions / body lotions removed Wigs or hairpieces removed NA Smoking or tobacco materials removed NA Books / newspapers / magazines / loose paper removed Cologne, aftershave, perfume and deodorant removed Jewelry removed (may wrap wedding band) Make-up removed Hair care products removed Battery operated devices (external) removed Heating patches and chemical warmers removed Titanium eyewear removed NA Nail polish cured greater than 10 hours NA Casting material cured greater than 10 hours NA Hearing aids removed NA Loose dentures or partials removed NA Prosthetics have been removed NA Patient demonstrates correct use of air break device (if applicable) Patient concerns have been addressed Patient  grounding bracelet on and cord attached to chamber Specifics for Inpatients (complete in addition to above) Medication sheet sent with patient NA Intravenous medications needed or due during therapy sent with patient NA Drainage tubes (e.g. nasogastric tube or  chest tube secured and vented) NA Endotracheal or Tracheotomy tube secured NA Cuff deflated of air and inflated with saline NA Airway suctioned NA Notes Paper version used prior to treatment Electronic Signature(s) Signed: 02/25/2021 12:38:40 PM By: [name] [name] , , Entered By: [name] on 02/25/2021 12:38:40

## 2021-02-25 NOTE — Progress Notes (Signed)
CHAMPION, CORALES ([id number]) Visit Report for 02/25/2021 Chief Complaint Document Details Patient Name: Date of Service: [address], [name] 02/25/2021 8:00 A M Medical Record Number: [id number] Patient Account Number: [id number] Date of Birth/Sex: Treating RN: 27-Nov-1970 (50 y.o. M) Primary Care Provider: [name] Other Clinician: Referring Provider: Treating Provider/Extender: [name], [name] in Treatment: 24 Information Obtained from: Patient Chief Complaint 09/05/2020; patient is here for review of the wound extensively on a right TMA amputation site. He also has a wound on the plantar aspect of the left foot Electronic Signature(s) Signed: 02/25/2021 6:11:30 PM By: Worthy Keeler PA-C Entered By: Worthy Keeler on 02/25/2021 08:24:54 -------------------------------------------------------------------------------- Debridement Details Patient Name: Date of Service: [name], [name] 02/25/2021 8:00 A M Medical Record Number: [id number] Patient Account Number: [id number] Date of Birth/Sex: Treating RN: [birth year] (50 y.o. [name] Primary Care Provider: [name] Other Clinician: Referring Provider: Treating Provider/Extender: [name], [name] in Treatment: 24 Debridement Performed for Assessment: Wound #3 Right,Proximal,Lateral Foot Performed By: Physician Worthy Keeler, PA Debridement Type: Debridement Severity of Tissue Pre Debridement: Necrosis of bone Level of Consciousness (Pre-procedure): Awake and Alert Pre-procedure Verification/Time Out Yes - 08:30 Taken: Start Time: 08:30 T Area Debrided (L x W): otal 1.5 (cm) x 1 (cm) = 1.5 (cm) Tissue and other material debrided: Viable, Non-Viable, Callus, Slough, Subcutaneous, Skin: Epidermis, Slough Level: Skin/Subcutaneous Tissue Debridement Description: Excisional Instrument: Curette Bleeding: Minimum Hemostasis Achieved: Pressure Procedural Pain:  0 Post Procedural Pain: 0 Response to Treatment: Procedure was tolerated well Level of Consciousness (Post- Awake and Alert procedure): Post Debridement Measurements of Total Wound Length: (cm) 1.4 Width: (cm) 0.4 Depth: (cm) 1.7 Volume: (cm) 0.748 Character of Wound/Ulcer Post Debridement: Improved Severity of Tissue Post Debridement: Necrosis of bone Post Procedure Diagnosis Same as Pre-procedure Electronic Signature(s) Signed: 02/25/2021 4:52:58 PM By: Baruch Gouty RN, BSN Signed: 02/25/2021 6:11:30 PM By: Worthy Keeler PA-C Entered By: Baruch Gouty on 02/25/2021 08:45:24 -------------------------------------------------------------------------------- HPI Details Patient Name: Date of Service: [name], [name] 02/25/2021 8:00 A M Medical Record Number: [id number] Patient Account Number: [id number] Date of Birth/Sex: Treating RN: 11-14-1970 (50 y.o. M) Primary Care Provider: [name] Other Clinician: Referring Provider: Treating Provider/Extender: [name], [name] in Treatment: 24 History of Present Illness HPI Description: ADMISSION 09/05/2020 This is a 50 year old man who has type 2 diabetes. Essentially the problem began admitted to [facility] health in December with infection gas gangrene. Cultures ultimately grew staph lugdunensis. 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 March Rummage. Shortly thereafter he developed a nonhealing wound with wound dehiscence noted on 05/09/2020. He had a wound VAC I think for a period of time after the surgery but it did not help. He has undergone an IandD and skin graft on the right foot on 05/16/2020. He underwent a further IandD on 06/04/2020 not debrided to bone. 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 Culture of the metatarsal grew Enterobacter cloacae I. He has been followed by Dr. [name] of  infectious disease. Currently on Levaquin and doxycycline I think at about the 4-week of 6 mark. At the last visit with Dr. March Rummage he was supposed to have use Santyl although I do not think he has it and he has been using I think a Betadine wet-to-dry. He is referred here by Dr. [name] for our  review who saw him yesterday. I cannot see that he has had imaging studies of the right foot. No MRI Past medical history includes type 2 diabetes, in 2021 and ulcer of the left foot that ultimately healed with a wound VAC, stage III chronic kidney disease, first toe amputations bilaterally, hypertension, still a 1/2 pack/day smoker. The patient had ABIs on 08/09/2018 which was 1.00 on the right and 0.96 on the left both areas had triphasic waveforms. Not felt to have an arterial issue. 09/12/2020; 1 week follow-up. He has been doing the dressings along with home health. This is a deep punched out area in the incision of his transmit site. We use silver alginate last week to help with drainage and antibacterial properties. He tolerated this well 6/24; the patient was actually at his podiatrist this morning who debrided the wound. We have been using silver alginate. They are going to follow him in 6 weeks. 6/29; patient is using silver alginate. Apparently saw Dr. March Rummage this week although I have not checked his note I will try to do so. He is following up in 6 weeks. This was the original transmetatarsal amputation when I first saw this 3 mid part of his amputation site was deep down to bone however this seems to have progressively close down which is gratifying to see. He still has a fairly callus uneven surface however spreading this apart its difficult to identify anything that does not look epithelialized. When I first saw this I thought he would require an extensive debridement perhaps back in the OR by Dr. March Rummage however all of this appears to be looking better and at this point I would continue with the silver  alginate see if this holds together over the next several weeks. He does not have an arterial issue 7/13; 2-week follow-up. Unfortunately the area did not hold together there was drainage on his dressing. Still copious amounts of callus. It was difficult to see where this actually was open therefore I elected to go ahead with a vigorous debridement. Still using silver alginate 7/20 I brought him back after extensive callus debridement last week. The only open area was on the medial part of the foot extensive debridement of thick surrounding callus and I was able to do identify the remaining tunneling area. 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 This is the area that we may have been following. This is smaller this week but still surrounded with thick callus He came in today with a large area of callus on the left midfoot. This I had noticed previously. HOWEVER our intake nurse clearly incorrectly documented this as opening. The callus and split there was an odor so all of this had to be removed. 10/29/2020 unfortunately upon evaluation today this patient appears to have significant mount of callus noted at this point. There does not appear to be any signs of active infection systemically which is great although locally he is having some discomfort around the lateral portion of his foot on the right. The left foot is no having a lot of callus I am not certain even has an open wound if there is anything is extremely tiny. 11/05/2020 upon evaluation today patient's wounds actually appear to be doing a bit better compared to last week since we have uncovered some of these areas I think there are some definite improvements. With that being said there is still quite a bit of  tunneling and undermining at many locations that again has me concerned I still think that the MRI is the best thing to do he has that scheduled for Saturday. 11/26/2020  upon evaluation today patient appears to be doing decently well in regard to his foot in general. He did have an MRI I did review this and he had multiple findings on MRI consistent with osteomyelitis across the board in regard pretty much to his remaining metatarsal regions. Nonetheless there was also some evidence of some involvement of the navicular bone as well. Either way I think that this patient actually is in desperate need of get him in for hyperbaric oxygen therapy we discussed that today that will be detailed in the plan. Nonetheless he is also still on the doxycycline which I think is appropriate for the time being as well I did go ahead and place him on that following the results of the MRI based on what we were seeing. I gave him that prescription for 14 days for the time being and we will go from there. 12/03/2020 upon evaluation today patient appears to be doing decently well in regard to his wounds in general in regard to the right foot. With that being said he does have a callus on the left foot in the end there was actually an area open area here that will need to be addressed. This is small and I think we take care of it now hopefully will heal quite readily. Fortunately I do not see any signs of infection worsening I am getting need to refill the antibiotic for him today. He has been taking doxycycline which seems to be doing well. We will also get a go ahead and get him approved for HBO therapy. 12/31/2020 upon evaluation today patient appears to be doing really roughly the same as compared to last time I saw him. Fortunately there is no evidence of active infection which is great news systemically he has started hyperbarics he seems to be doing decently well he said a couple times where anxiety kind of got to him but overall I think he is doing awesome. He is no longer on the antibiotic therapy. With that being said I do believe that he is going to need likely some additional  antibiotics based on what I am seeing today we can actually obtain a bone culture from the plantar wound that was hiding underneath the callus. He was having pain in this area which is what may be try to clear away the callus to look sure enough there was an actual opening that went deeper into the wound bed region here. 01/21/2021 upon evaluation today patient appears to be doing about the same in regard to his foot in general. He does have some depth to some of the wound areas I really think he would benefit from is trying to see about getting him set up for a gauze VAC. I think that the gauze VAC could be helpful for him and will work much more effectively with what we are seeing as compared to traditional foam wound VAC. The patient is in agreement with giving this a trial. Nonetheless unfortunately think that he overall seems to be doing really well and I think we are headed in the right direction as far as the overall healing is concerned. 01/28/2021 upon evaluation today patient appears to be doing well with regard to his wound all things considered he does have a lot of callus buildup especially on the plantar  aspect of the left foot. There is no wound here but a significant amount of callus. That is getting need to be addressed today. Subsequently he also has a lot of callus on the right foot as well as some of the area around the wounds actually being significantly callused. Again I need to clear this away today. 02/04/2021 upon evaluation today patient actually seems to be doing quite well with regard to his foot ulcer. I do believe that the hyperbaric oxygen therapy has been beneficial for him thus far also think that he is doing well with the wound VAC. In combination I think he has an excellent plan at this time as far as treatment is concerned. I do not see any signs of evidence of infection which is great news as well. 02/11/2021 upon evaluation today patient appears to be doing okay in  regard to his foot ulcer though still has deep wounds that do not seem to be doing nearly as good as what I would like to see. I think that the wound VAC is helping but I do believe there still bone in the base of the wound I think we probably need to see about put him back on an antibiotic, do a PCR culture today to see what we find in order to determine whether or not we need to have him on antibiotics which I think we probably do is just a matter which 1. 02/18/2021 upon evaluation today patient appears to be doing okay with regard to his foot ulcer. He has been using the wound VAC although there is a lot of drainage and to be honest I am not certain this is doing as well as we wanted to right now especially considering the fact the soles of gotten so small which in some ways is good but in other ways is still problematic for Sorbact standpoint. I am not even certain we will be able to reinitiate the VAC even next week. Nonetheless for now he is on the [place] which is definitely something that we need to get going I am also can see about a referral back to infectious disease to see what they have to say in regard to what is going on currently as well. He voiced understanding and is in agreement with that plan. I did explain this is a very touchy go situation which hinges on [place] getting this healed and the infection improved if not he is looking at amputation. Obviously this is a limb salvage Plan at this point. 02/25/2021 upon evaluation today patient's wound bed actually showed signs of being much drier and much less macerated compared to last week. We had a wound VAC on hold for this week. With that being said based on what I am seeing I think we probably just want to cancel the wound VAC altogether. I do not think it was doing enough for him to warrant the maceration and risks that are associated. He is on the [place] at this point. He also has an infection disease appointment next week. We will  see what they have to say at this juncture. Nonetheless currently he is wrapping up his first round of hyperbaric oxygen therapy we could consider repeat although the biggest issue right now is simply that he actually is having surgery on his limbs due to cataract issues that is tomorrow. Following that surgery I am not certain he really needs to be in the chamber for some period of time we need to see what  his surgeon says. Electronic Signature(s) Signed: 02/25/2021 6:02:18 PM By: Worthy Keeler PA-C Entered By: Worthy Keeler on 02/25/2021 18:02:18 -------------------------------------------------------------------------------- Paring/cutting 1 benign hyperkeratotic lesion Details Patient Name: Date of Service: [place] RD, [name] 02/25/2021 8:00 A M Medical Record Number: [id number] Patient Account Number: [id number] Date of Birth/Sex: Treating RN: 10-May-1970 (50 y.o. [name] Primary Care Provider: [name] Other Clinician: Referring Provider: Treating Provider/Extender: [name], [name] in Treatment: 24 Procedure Performed for: Non-Wound Location Performed By: Physician Worthy Keeler, PA Post Procedure Diagnosis Same as Pre-procedure Notes left plantar foot using #10 blade and #3 curette Electronic Signature(s) Signed: 02/25/2021 4:52:58 PM By: Baruch Gouty RN, BSN Signed: 02/25/2021 6:11:30 PM By: Worthy Keeler PA-C Entered By: Baruch Gouty on 02/25/2021 08:42:47 -------------------------------------------------------------------------------- Physical Exam Details Patient Name: Date of Service: [name], [name] 02/25/2021 8:00 A M Medical Record Number: [id number] Patient Account Number: [id number] Date of Birth/Sex: Treating RN: Jan 11, 1971 (50 y.o. M) Primary Care Provider: [name] Other Clinician: Referring Provider: Treating Provider/Extender: [name], [name] in Treatment:  24 Constitutional Well-nourished and well-hydrated in no acute distress. Respiratory normal breathing without difficulty. Psychiatric this patient is able to make decisions and demonstrates good insight into disease process. Alert and Oriented x 3. pleasant and cooperative. Notes Upon inspection patient's wounds did require some sharp debridement on the plantar aspect clear away some of the callus and necrotic debris here. He tolerated that without complication I did remove some of the epiboly internally as well on the plantar wound to try to help this to heal. Also performed a fairly extensive debridement of the left plantar foot where he was having a significant amount of callus buildup no open wound care. Electronic Signature(s) Signed: 02/25/2021 6:02:59 PM By: Worthy Keeler PA-C Previous Signature: 02/25/2021 6:02:44 PM Version By: Worthy Keeler PA-C Entered By: Worthy Keeler on 02/25/2021 18:02:58 -------------------------------------------------------------------------------- Physician Orders Details Patient Name: Date of Service: [name], [name] 02/25/2021 8:00 A M Medical Record Number: [id number] Patient Account Number: [id number] Date of Birth/Sex: Treating RN: Nov 10, 1970 (50 y.o. [name] Primary Care Provider: [name] Other Clinician: Referring Provider: Treating Provider/Extender: [name], [name] in Treatment: 24 Verbal / Phone Orders: No Diagnosis Coding ICD-10 Coding Code Description [phone] Other chronic osteomyelitis, right ankle and foot E11.621 Type 2 diabetes mellitus with foot ulcer L97.514 Non-pressure chronic ulcer of other part of right foot with necrosis of bone T81.31XS Disruption of external operation (surgical) wound, not elsewhere classified, sequela F17.218 Nicotine dependence, cigarettes, with other nicotine-induced disorders L97.522 Non-pressure chronic ulcer of other part of left foot with fat layer  exposed L84 Corns and callosities Follow-up Appointments ppointment in 1 week. Margarita Grizzle Return A PATIENT TO CHANGE OUTER DRESSING ON [place] OR SATURDAY Nurse Visit: - Monday 12/5 Bathing/ Shower/ Hygiene May shower with protection but do not get wound dressing(s) wet. Negative Presssure Wound Therapy Discontinue wound vac Edema Control - Lymphedema / SCD / Other Elevate legs to the level of the heart or above for 30 minutes daily and/or when sitting, a frequency of: Avoid standing for long periods of time. Moisturize legs daily. - to foot with dressing changes Off-Loading Open toe surgical shoe to: - Wear at all times except driving. on right foot Other: - may wear shoe with memory foam insert to left foot, cut hole where callous is and insert in shoe to help offload Additional  Orders / Instructions Stop/Decrease Smoking Follow Nutritious Diet Non Wound Condition Other Non Wound Condition Orders/Instructions: - cushion bottom of left foot with orthopedic felt or foam Wound Treatment Wound #3 - Foot Wound Laterality: Right, Lateral, Proximal Cleanser: Soap and Water 3 x Per Week/30 Days Discharge Instructions: May shower and wash wound with dial antibacterial soap and water prior to dressing change. Cleanser: Wound Cleanser 3 x Per Week/30 Days Discharge Instructions: Cleanse the wound with wound cleanser prior to applying a clean dressing using gauze sponges, not tissue or cotton balls. Peri-Wound Care: Sween Lotion (Moisturizing lotion) 3 x Per Week/30 Days Discharge Instructions: Apply moisturizing lotion to foot with dressing changes Prim Dressing: Iodoform packing strip 1/4 (in) 3 x Per Week/30 Days ary Discharge Instructions: Lightly pack as instructed Secondary Dressing: Woven Gauze Sponge, Non-Sterile 4x4 in 3 x Per Week/30 Days Discharge Instructions: Apply over primary dressing as directed. Secondary Dressing: ABD Pad, 5x9 3 x Per Week/30 Days Discharge Instructions: Apply  over primary dressing as directed. Secured With: Coban Self-Adherent Wrap 4x5 (in/yd) (Generic) 3 x Per Week/30 Days Discharge Instructions: Secure with Coban as directed. Secured With: [employer], 4.5x3.1 (in/yd) (Generic) 3 x Per Week/30 Days Discharge Instructions: Secure with Kerlix as directed. Wound #7 - Foot Wound Laterality: Plantar, Right, Lateral Cleanser: Soap and Water 3 x Per Week/30 Days Discharge Instructions: May shower and wash wound with dial antibacterial soap and water prior to dressing change. Cleanser: Wound Cleanser 3 x Per Week/30 Days Discharge Instructions: Cleanse the wound with wound cleanser prior to applying a clean dressing using gauze sponges, not tissue or cotton balls. Peri-Wound Care: Sween Lotion (Moisturizing lotion) 3 x Per Week/30 Days Discharge Instructions: Apply moisturizing lotion to foot with dressing changes Prim Dressing: Iodoform packing strip 1/4 (in) 3 x Per Week/30 Days ary Discharge Instructions: Lightly pack as instructed Secondary Dressing: Woven Gauze Sponge, Non-Sterile 4x4 in 3 x Per Week/30 Days Discharge Instructions: Apply over primary dressing as directed. Secondary Dressing: ABD Pad, 5x9 3 x Per Week/30 Days Discharge Instructions: Apply over primary dressing as directed. Secured With: Coban Self-Adherent Wrap 4x5 (in/yd) (Generic) 3 x Per Week/30 Days Discharge Instructions: Secure with Coban as directed. Secured With: [employer], 4.5x3.1 (in/yd) (Generic) 3 x Per Week/30 Days Discharge Instructions: Secure with Kerlix as directed. Electronic Signature(s) Signed: 02/25/2021 4:52:58 PM By: Baruch Gouty RN, BSN Signed: 02/25/2021 6:11:30 PM By: Worthy Keeler PA-C Entered By: Baruch Gouty on 02/25/2021 08:41:46 -------------------------------------------------------------------------------- Problem List Details Patient Name: Date of Service: [name], [name] 02/25/2021 8:00 A M Medical Record  Number: [id number] Patient Account Number: [id number] Date of Birth/Sex: Treating RN: 10/15/1970 (50 y.o. M) Primary Care Provider: [name] Other Clinician: Referring Provider: Treating Provider/Extender: [name], [name] in Treatment: 24 Active Problems ICD-10 Encounter Code Description Active Date MDM Diagnosis [phone] Other chronic osteomyelitis, right ankle and foot 09/05/2020 No Yes E11.621 Type 2 diabetes mellitus with foot ulcer 09/05/2020 No Yes L97.514 Non-pressure chronic ulcer of other part of right foot with necrosis of bone 09/05/2020 No Yes T81.31XS Disruption of external operation (surgical) wound, not elsewhere classified, 09/05/2020 No Yes sequela F17.218 Nicotine dependence, cigarettes, with other nicotine-induced disorders 11/26/2020 No Yes L97.522 Non-pressure chronic ulcer of other part of left foot with fat layer exposed 10/22/2020 No Yes L84 Corns and callosities 01/28/2021 No Yes Inactive Problems Resolved Problems Electronic Signature(s) Signed: 02/25/2021 6:11:30 PM By: Worthy Keeler PA-C Entered By: Worthy Keeler on  02/25/2021 08:24:33 -------------------------------------------------------------------------------- Progress Note Details Patient Name: Date of Service: [place] RD, [name] 02/25/2021 8:00 A M Medical Record Number: [id number] Patient Account Number: [id number] Date of Birth/Sex: Treating RN: 1970-07-14 (50 y.o. M) Primary Care Provider: [name] Other Clinician: Referring Provider: Treating Provider/Extender: [name], [name] in Treatment: 24 Subjective Chief Complaint Information obtained from Patient 09/05/2020; patient is here for review of the wound extensively on a right TMA amputation site. He also has a wound on the plantar aspect of the left foot History of Present Illness (HPI) ADMISSION 09/05/2020 This is a 50 year old man who has type 2 diabetes. Essentially the problem began  admitted to [facility] health in December with infection gas gangrene. Cultures ultimately grew staph lugdunensis. 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 March Rummage. Shortly thereafter he developed a nonhealing wound with wound dehiscence noted on 05/09/2020. He had a wound VAC I think for a period of time after the surgery but it did not help. He has undergone an IandD and skin graft on the right foot on 05/16/2020. He underwent a further IandD on 06/04/2020 not debrided to bone. 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 Culture of the metatarsal grew Enterobacter cloacae I. He has been followed by Dr. [name] of infectious disease. Currently on Levaquin and doxycycline I think at about the 4-week of 6 mark. At the last visit with Dr. March Rummage he was supposed to have use Santyl although I do not think he has it and he has been using I think a Betadine wet-to-dry. He is referred here by Dr. [name] for our review who saw him yesterday. I cannot see that he has had imaging studies of the right foot. No MRI Past medical history includes type 2 diabetes, in 2021 and ulcer of the left foot that ultimately healed with a wound VAC, stage III chronic kidney disease, first toe amputations bilaterally, hypertension, still a 1/2 pack/day smoker. The patient had ABIs on 08/09/2018 which was 1.00 on the right and 0.96 on the left both areas had triphasic waveforms. Not felt to have an arterial issue. 09/12/2020; 1 week follow-up. He has been doing the dressings along with home health. This is a deep punched out area in the incision of his transmit site. We use silver alginate last week to help with drainage and antibacterial properties. He tolerated this well 6/24; the patient was actually at his podiatrist this morning who debrided the wound. We have been using silver alginate. They are going to follow him in  6 weeks. 6/29; patient is using silver alginate. Apparently saw Dr. March Rummage this week although I have not checked his note I will try to do so. He is following up in 6 weeks. This was the original transmetatarsal amputation when I first saw this 3 mid part of his amputation site was deep down to bone however this seems to have progressively close down which is gratifying to see. He still has a fairly callus uneven surface however spreading this apart its difficult to identify anything that does not look epithelialized. When I first saw this I thought he would require an extensive debridement perhaps back in the OR by Dr. March Rummage however all of this appears to be looking better and at this point I would continue with the silver alginate see if this holds together over the next several weeks. He does not have an  arterial issue 7/13; 2-week follow-up. Unfortunately the area did not hold together there was drainage on his dressing. Still copious amounts of callus. It was difficult to see where this actually was open therefore I elected to go ahead with a vigorous debridement. Still using silver alginate 7/20 I brought him back after extensive callus debridement last week. The only open area was on the medial part of the foot extensive debridement of thick surrounding callus and I was able to do identify the remaining tunneling area. 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 This is the area that we may have been following. This is smaller this week but still surrounded with thick callus He came in today with a large area of callus on the left midfoot. This I had noticed previously. HOWEVER our intake nurse clearly incorrectly documented this as opening. The callus and split there was an odor so all of this had to be removed. 10/29/2020 unfortunately upon evaluation today this patient appears to have significant mount of callus noted at this  point. There does not appear to be any signs of active infection systemically which is great although locally he is having some discomfort around the lateral portion of his foot on the right. The left foot is no having a lot of callus I am not certain even has an open wound if there is anything is extremely tiny. 11/05/2020 upon evaluation today patient's wounds actually appear to be doing a bit better compared to last week since we have uncovered some of these areas I think there are some definite improvements. With that being said there is still quite a bit of tunneling and undermining at many locations that again has me concerned I still think that the MRI is the best thing to do he has that scheduled for Saturday. 11/26/2020 upon evaluation today patient appears to be doing decently well in regard to his foot in general. He did have an MRI I did review this and he had multiple findings on MRI consistent with osteomyelitis across the board in regard pretty much to his remaining metatarsal regions. Nonetheless there was also some evidence of some involvement of the navicular bone as well. Either way I think that this patient actually is in desperate need of get him in for hyperbaric oxygen therapy we discussed that today that will be detailed in the plan. Nonetheless he is also still on the doxycycline which I think is appropriate for the time being as well I did go ahead and place him on that following the results of the MRI based on what we were seeing. I gave him that prescription for 14 days for the time being and we will go from there. 12/03/2020 upon evaluation today patient appears to be doing decently well in regard to his wounds in general in regard to the right foot. With that being said he does have a callus on the left foot in the end there was actually an area open area here that will need to be addressed. This is small and I think we take care of it now hopefully will heal quite readily.  Fortunately I do not see any signs of infection worsening I am getting need to refill the antibiotic for him today. He has been taking doxycycline which seems to be doing well. We will also get a go ahead and get him approved for HBO therapy. 12/31/2020 upon evaluation today patient appears  to be doing really roughly the same as compared to last time I saw him. Fortunately there is no evidence of active infection which is great news systemically he has started hyperbarics he seems to be doing decently well he said a couple times where anxiety kind of got to him but overall I think he is doing awesome. He is no longer on the antibiotic therapy. With that being said I do believe that he is going to need likely some additional antibiotics based on what I am seeing today we can actually obtain a bone culture from the plantar wound that was hiding underneath the callus. He was having pain in this area which is what may be try to clear away the callus to look sure enough there was an actual opening that went deeper into the wound bed region here. 01/21/2021 upon evaluation today patient appears to be doing about the same in regard to his foot in general. He does have some depth to some of the wound areas I really think he would benefit from is trying to see about getting him set up for a gauze VAC. I think that the gauze VAC could be helpful for him and will work much more effectively with what we are seeing as compared to traditional foam wound VAC. The patient is in agreement with giving this a trial. Nonetheless unfortunately think that he overall seems to be doing really well and I think we are headed in the right direction as far as the overall healing is concerned. 01/28/2021 upon evaluation today patient appears to be doing well with regard to his wound all things considered he does have a lot of callus buildup especially on the plantar aspect of the left foot. There is no wound here but a significant  amount of callus. That is getting need to be addressed today. Subsequently he also has a lot of callus on the right foot as well as some of the area around the wounds actually being significantly callused. Again I need to clear this away today. 02/04/2021 upon evaluation today patient actually seems to be doing quite well with regard to his foot ulcer. I do believe that the hyperbaric oxygen therapy has been beneficial for him thus far also think that he is doing well with the wound VAC. In combination I think he has an excellent plan at this time as far as treatment is concerned. I do not see any signs of evidence of infection which is great news as well. 02/11/2021 upon evaluation today patient appears to be doing okay in regard to his foot ulcer though still has deep wounds that do not seem to be doing nearly as good as what I would like to see. I think that the wound VAC is helping but I do believe there still bone in the base of the wound I think we probably need to see about put him back on an antibiotic, do a PCR culture today to see what we find in order to determine whether or not we need to have him on antibiotics which I think we probably do is just a matter which 1. 02/18/2021 upon evaluation today patient appears to be doing okay with regard to his foot ulcer. He has been using the wound VAC although there is a lot of drainage and to be honest I am not certain this is doing as well as we wanted to right now especially considering the fact the soles of gotten so small which in  some ways is good but in other ways is still problematic for Sorbact standpoint. I am not even certain we will be able to reinitiate the VAC even next week. Nonetheless for now he is on the [place] which is definitely something that we need to get going I am also can see about a referral back to infectious disease to see what they have to say in regard to what is going on currently as well. He voiced understanding and  is in agreement with that plan. I did explain this is a very touchy go situation which hinges on [place] getting this healed and the infection improved if not he is looking at amputation. Obviously this is a limb salvage Plan at this point. 02/25/2021 upon evaluation today patient's wound bed actually showed signs of being much drier and much less macerated compared to last week. We had a wound VAC on hold for this week. With that being said based on what I am seeing I think we probably just want to cancel the wound VAC altogether. I do not think it was doing enough for him to warrant the maceration and risks that are associated. He is on the [place] at this point. He also has an infection disease appointment next week. We will see what they have to say at this juncture. Nonetheless currently he is wrapping up his first round of hyperbaric oxygen therapy we could consider repeat although the biggest issue right now is simply that he actually is having surgery on his limbs due to cataract issues that is tomorrow. Following that surgery I am not certain he really needs to be in the chamber for some period of time we need to see what his surgeon says. Objective Constitutional Well-nourished and well-hydrated in no acute distress. Vitals Time Taken: 7:50 AM, Height: 65 in, Weight: 196 lbs, BMI: 32.6, Temperature: 98.3 F, Pulse: 102 bpm, Respiratory Rate: 16 breaths/min, Blood Pressure: 139/84 mmHg. Respiratory normal breathing without difficulty. Psychiatric this patient is able to make decisions and demonstrates good insight into disease process. Alert and Oriented x 3. pleasant and cooperative. General Notes: Upon inspection patient's wounds did require some sharp debridement on the plantar aspect clear away some of the callus and necrotic debris here. He tolerated that without complication I did remove some of the epiboly internally as well on the plantar wound to try to help this to heal. Also  performed a fairly extensive debridement of the left plantar foot where he was having a significant amount of callus buildup no open wound care. Integumentary (Hair, Skin) Wound #3 status is Open. Original cause of wound was Gradually Appeared. The date acquired was: 10/29/2020. The wound has been in treatment 17 weeks. The wound is located on the Right,Proximal,Lateral Foot. The wound measures 1.3cm length x 0.2cm width x 1cm depth; 0.204cm^2 area and 0.204cm^3 volume. There is no tunneling noted, however, there is undermining starting at 11:00 and ending at 2:00 with a maximum distance of 0.5cm. There is a medium amount of serosanguineous drainage noted. The wound margin is thickened. There is no granulation within the wound bed. There is no necrotic tissue within the wound bed. General Notes: maceration noted to periwound. Wound #7 status is Open. Original cause of wound was Gradually Appeared. The date acquired was: 12/31/2020. The wound has been in treatment 8 weeks. The wound is located on the [place]. The wound measures 0.5cm length x 0.4cm width x 2.2cm depth; 0.157cm^2 area and 0.346cm^3 volume. There is bone  and Fat Layer (Subcutaneous Tissue) exposed. There is no tunneling or undermining noted. There is a medium amount of serosanguineous drainage noted. The wound margin is distinct with the outline attached to the wound base. There is large (67-100%) red granulation within the wound bed. There is no necrotic tissue within the wound bed. General Notes: callous periwound. Assessment Active Problems ICD-10 Other chronic osteomyelitis, right ankle and foot Type 2 diabetes mellitus with foot ulcer Non-pressure chronic ulcer of other part of right foot with necrosis of bone Disruption of external operation (surgical) wound, not elsewhere classified, sequela Nicotine dependence, cigarettes, with other nicotine-induced disorders Non-pressure chronic ulcer of other part of  left foot with fat layer exposed Corns and callosities Procedures Wound #3 Pre-procedure diagnosis of Wound #3 is a Diabetic Wound/Ulcer of the Lower Extremity located on the Right,Proximal,Lateral Foot .Severity of Tissue Pre Debridement is: Necrosis of bone. There was a Excisional Skin/Subcutaneous Tissue Debridement with a total area of 1.5 sq cm performed by Worthy Keeler, PA. With the following instrument(s): Curette to remove Viable and Non-Viable tissue/material. Material removed includes Callus, Subcutaneous Tissue, Slough, and Skin: Epidermis. No specimens were taken. A time out was conducted at 08:30, prior to the start of the procedure. A Minimum amount of bleeding was controlled with Pressure. The procedure was tolerated well with a pain level of 0 throughout and a pain level of 0 following the procedure. Post Debridement Measurements: 1.4cm length x 0.4cm width x 1.7cm depth; 0.748cm^3 volume. Character of Wound/Ulcer Post Debridement is improved. Severity of Tissue Post Debridement is: Necrosis of bone. Post procedure Diagnosis Wound #3: Same as Pre-Procedure A Paring/cutting 1 benign hyperkeratotic lesion procedure was performed. by Worthy Keeler, PA. Post procedure Diagnosis Wound #: Same as Pre-Procedure Notes: left plantar foot using #10 blade and #3 curette Plan Follow-up Appointments: Return Appointment in 1 week. Margarita Grizzle PATIENT TO CHANGE OUTER DRESSING ON [place] SATURDAY Nurse Visit: - Monday 12/5 Bathing/ Shower/ Hygiene: May shower with protection but do not get wound dressing(s) wet. Negative Presssure Wound Therapy: Discontinue wound vac Edema Control - Lymphedema / SCD / Other: Elevate legs to the level of the heart or above for 30 minutes daily and/or when sitting, a frequency of: Avoid standing for long periods of time. Moisturize legs daily. - to foot with dressing changes Off-Loading: Open toe surgical shoe to: - Wear at all times except driving. on  right foot Other: - may wear shoe with memory foam insert to left foot, cut hole where callous is and insert in shoe to help offload Additional Orders / Instructions: Stop/Decrease Smoking Follow Nutritious Diet Non Wound Condition: Other Non Wound Condition Orders/Instructions: - cushion bottom of left foot with orthopedic felt or foam WOUND #3: - Foot Wound Laterality: Right, Lateral, Proximal Cleanser: Soap and Water 3 x Per Week/30 Days Discharge Instructions: May shower and wash wound with dial antibacterial soap and water prior to dressing change. Cleanser: Wound Cleanser 3 x Per Week/30 Days Discharge Instructions: Cleanse the wound with wound cleanser prior to applying a clean dressing using gauze sponges, not tissue or cotton balls. Peri-Wound Care: Sween Lotion (Moisturizing lotion) 3 x Per Week/30 Days Discharge Instructions: Apply moisturizing lotion to foot with dressing changes Prim Dressing: Iodoform packing strip 1/4 (in) 3 x Per Week/30 Days ary Discharge Instructions: Lightly pack as instructed Secondary Dressing: Woven Gauze Sponge, Non-Sterile 4x4 in 3 x Per Week/30 Days Discharge Instructions: Apply over primary dressing as directed. Secondary Dressing: ABD Pad,  5x9 3 x Per Week/30 Days Discharge Instructions: Apply over primary dressing as directed. Secured With: [place] Self-Adherent Wrap 4x5 (in/yd) (Generic) 3 x Per Week/30 Days Discharge Instructions: Secure with Cob[place] as directed. Secured With: [employer], 4.5x3.1 (in/yd) (Generic) 3 x Per Week/30 Days Discharge Instructions: Secure with Kerlix as directed. WOUND #7: - Foot Wound Laterality: Plantar, Right, Lateral Cleanser: Soap and Water 3 x Per Week/30 Days Discharge Instructions: May shower and wash wound with dial antibacterial soap and water prior to dressing change. Cleanser: Wound Cleanser 3 x Per Week/30 Days Discharge Instructions: Cleanse the wound with wound cleanser prior to applying a clean  dressing using gauze sponges, not tissue or cotton balls. Peri-Wound Care: Sween Lotion (Moisturizing lotion) 3 x Per Week/30 Days Discharge Instructions: Apply moisturizing lotion to foot with dressing changes Prim Dressing: Iodoform packing strip 1/4 (in) 3 x Per Week/30 Days ary Discharge Instructions: Lightly pack as instructed Secondary Dressing: Woven Gauze Sponge, Non-Sterile 4x4 in 3 x Per Week/30 Days Discharge Instructions: Apply over primary dressing as directed. Secondary Dressing: ABD Pad, 5x9 3 x Per Week/30 Days Discharge Instructions: Apply over primary dressing as directed. Secured With: [place] Self-Adherent Wrap 4x5 (in/yd) (Generic) 3 x Per Week/30 Days Discharge Instructions: Secure with [place] as directed. Secured With: [employer], 4.5x3.1 (in/yd) (Generic) 3 x Per Week/30 Days Discharge Instructions: Secure with Kerlix as directed. 1. I am good recommend currently that we go ahead and continue with the wound care measures as before and the patient is in agreement with that plan as the use of the iodoform gauze packing which I think is probably the best way to go currently. 2. I am also can recommend that we have the patient continue with the ABD and roll gauze to secure in place. 3. I am to go ahead and discontinue the wound VAC currently. 4. I am and have him also continue with the [facility] for now and we will see what infectious disease recommends when he sees them next week. 5. With regard to continuation of hyperbaric oxygen therapy we could see about getting that approved but I need to hold off until we find out what his eye surgeon says following the lens replacement tomorrow. We will see patient back for reevaluation in 1 week here in the clinic. If anything worsens or changes patient will contact our office for additional recommendations. Electronic Signature(s) Signed: 02/25/2021 6:04:01 PM By: Worthy Keeler PA-C Entered By: Worthy Keeler on 02/25/2021  18:04:00 -------------------------------------------------------------------------------- SuperBill Details Patient Name: Date of Service: [name], [name] 02/25/2021 Medical Record Number: [id number] Patient Account Number: [id number] Date of Birth/Sex: Treating RN: [birth year] (50 y.o. [name] Primary Care Provider: [name] Other Clinician: Referring Provider: Treating Provider/Extender: [name], [name] in Treatment: 24 Diagnosis Coding ICD-10 Codes Code Description [id number] Other chronic osteomyelitis, right ankle and foot E11.621 Type 2 diabetes mellitus with foot ulcer L97.514 Non-pressure chronic ulcer of other part of right foot with necrosis of bone T81.31XS Disruption of external operation (surgical) wound, not elsewhere classified, sequela F17.218 Nicotine dependence, cigarettes, with other nicotine-induced disorders L97.522 Non-pressure chronic ulcer of other part of left foot with fat layer exposed L84 Corns and callosities Facility Procedures CPT4 Code: [id number] Description: 09381 - DEB SUBQ TISSUE 20 SQ CM/< ICD-10 Diagnosis Description L97.514 Non-pressure chronic ulcer of other part of right foot with necrosis of bone Modifier: Quantity: 1 CPT4 Code: 82993716 Description: 11055 - PARE BENIGN LES; SGL  ICD-10 Diagnosis Description L84 Corns and callosities Modifier: 32 Quantity: 1 Physician Procedures : CPT4 Code Description Modifier [id number] - WC PHYS LEVEL 4 - EST PT 25 ICD-10 Diagnosis Description M86.671 Other chronic osteomyelitis, right ankle and foot E11.621 Type 2 diabetes mellitus with foot ulcer L97.514 Non-pressure chronic ulcer of  other part of right foot with necrosis of bone T81.31XS Disruption of external operation (surgical) wound, not elsewhere classified, sequela Quantity: 1 : [id number] 19379 - WC PHYS SUBQ TISS 20 SQ CM ICD-10 Diagnosis Description L97.514 Non-pressure chronic ulcer of other part of right  foot with necrosis of bone Quantity: 1 : [id number] 53299 - WC PHYS PARE BENIGN LES; SGL 59 ICD-10 Diagnosis Description L84 Corns and callosities Quantity: 1 Electronic Signature(s) Signed: 02/25/2021 6:04:25 PM By: Worthy Keeler PA-C Previous Signature: 02/25/2021 9:45:32 AM Version By: [name] RN, BSN Entered By: Worthy Keeler on 02/25/2021 18:04:24

## 2021-02-25 NOTE — Progress Notes (Signed)
[name] ([id number]) Visit Report for 02/25/2021 Arrival Information Details Patient Name: Date of Service: [name], [name] 02/25/2021 8:00 A M Medical Record Number: [id number] Patient Account Number: [id number] Date of Birth/Sex: Treating RN: 03/05/1971 (50 y.o. [name], Meta.Reding Primary Care Abem Shaddix: [name] Other Clinician: Referring Mackensie Pilson: Treating Willia Lampert/Extender: [name], [name] in Treatment: 24 Visit Information History Since Last Visit Added or deleted any medications: No Patient Arrived: [name] Any new allergies or adverse reactions: No Arrival Time: 07:50 Had a fall or experienced change in No Accompanied By: self activities of daily living that may affect Transfer Assistance: None risk of falls: Patient Identification Verified: Yes Signs or symptoms of abuse/neglect since last visito No Secondary Verification Process Completed: Yes Hospitalized since last visit: No Patient Requires Transmission-Based Precautions: No Implantable device outside of the clinic excluding No Patient Has Alerts: No cellular tissue based products placed in the center since last visit: Has Dressing in Place as Prescribed: Yes Has Footwear/Offloading in Place as Prescribed: Yes Left: Other:inserts in shoes. Right: Other:inserts in shoes. Pain Present Now: No Electronic Signature(s) Signed: 02/25/2021 5:54:55 PM By: [name] RN, BSN Entered By: [name] on 02/25/2021 07:57:09 -------------------------------------------------------------------------------- Encounter Discharge Information Details Patient Name: Date of Service: [name], [place]. 02/25/2021 8:00 A M Medical Record Number: [id number] Patient Account Number: [id number] Date of Birth/Sex: Treating RN: Dec 31, 1970 (50 y.o. [name] Primary Care Jolea Dolle: [name] Other Clinician: Referring Jovante Hammitt: Treating Jenya Putz/Extender: [name],  [name] in Treatment: 24 Encounter Discharge Information Items Post Procedure Vitals Discharge Condition: Stable Temperature (F): 98.3 Ambulatory Status: Cane Pulse (bpm): 102 Discharge Destination: Home Respiratory Rate (breaths/min): 18 Transportation: Private Auto Blood Pressure (mmHg): 139/84 Accompanied By: self Schedule Follow-up Appointment: Yes Clinical Summary of Care: Patient Declined Electronic Signature(s) Signed: 02/25/2021 4:52:58 PM By: Baruch Gouty RN, BSN Entered By: Baruch Gouty on 02/25/2021 09:01:36 -------------------------------------------------------------------------------- Lower Extremity Assessment Details Patient Name: Date of Service: [name], [name] 02/25/2021 8:00 A M Medical Record Number: [id number] Patient Account Number: [id number] Date of Birth/Sex: Treating RN: Jan 13, 1971 (50 y.o. [name] Primary Care Dorr Perrot: [name] Other Clinician: Referring Oval Cavazos: Treating Woodroe Vogan/Extender: [name], [name] in Treatment: 24 Edema Assessment Assessed: [Left: Yes] [Right: Yes] Edema: [Left: Yes] [Right: Yes] Calf Left: Right: Point of Measurement: From Medial Instep 34 cm 33 cm Ankle Left: Right: Point of Measurement: From Medial Instep 21 cm 22 cm Vascular Assessment Pulses: Dorsalis Pedis Palpable: [Left:Yes] [Right:Yes] Electronic Signature(s) Signed: 02/25/2021 5:54:55 PM By: [name] RN, BSN Entered By: [name] on 02/25/2021 07:59:49 -------------------------------------------------------------------------------- [place] Details Patient Name: Date of Service: Joseph Hahn RD, [name] 02/25/2021 8:00 A M Medical Record Number: [id number] Patient Account Number: [id number] Date of Birth/Sex: Treating RN: 05-11-1970 (50 y.o. [name] Primary Care Clio Gerhart: [name] Other Clinician: Referring Hildred Mollica: Treating Kaisy Severino/Extender: [name], [name] in Treatment: 24 Active Inactive Osteomyelitis Nursing Diagnoses: Infection: osteomyelitis Knowledge deficit related to disease process and management Goals: Patient/caregiver will verbalize understanding of disease process and disease management Date Initiated: 11/26/2020 Target Resolution Date: 03/25/2021 Goal Status: Active Patient's osteomyelitis will resolve Date Initiated: 11/26/2020 Target Resolution Date: 03/25/2021 Goal Status: Active Interventions: Assess for signs and symptoms of osteomyelitis resolution every visit Provide education on osteomyelitis Screen for HBO Treatment Activities: Systemic antibiotics : 11/26/2020 Notes: 02/25/21 continuiing treatment, ID appointment next week Wound/Skin Impairment Nursing Diagnoses: Impaired tissue  integrity Goals: Patient/caregiver will verbalize understanding of skin care regimen Date Initiated: 09/05/2020 Target Resolution Date: 03/25/2021 Goal Status: Active Ulcer/skin breakdown will have a volume reduction of 30% by week 4 Date Initiated: 09/05/2020 Date Inactivated: 10/08/2020 Target Resolution Date: 10/03/2020 Goal Status: Met Interventions: Assess patient/caregiver ability to obtain necessary supplies Assess patient/caregiver ability to perform ulcer/skin care regimen upon admission and as needed Assess ulceration(s) every visit Provide education on ulcer and skin care Treatment Activities: Patient referred to home care : 09/05/2020 Skin care regimen initiated : 09/05/2020 Topical wound management initiated : 09/05/2020 Notes: Electronic Signature(s) Signed: 02/25/2021 4:52:58 PM By: Baruch Gouty RN, BSN Entered By: Baruch Gouty on 02/25/2021 08:32:50 -------------------------------------------------------------------------------- Pain Assessment Details Patient Name: Date of Service: Joseph Hahn RD, [name] 02/25/2021 8:00 A M Medical Record Number: [id number] Patient Account Number:  [id number] Date of Birth/Sex: Treating RN: 1970/09/05 (50 y.o. [name] Primary Care Kia Varnadore: [name] Other Clinician: Referring Haitham Dolinsky: Treating Mellony Danziger/Extender: [name], [name] in Treatment: 24 Active Problems Location of Pain Severity and Description of Pain Patient Has Paino No Site Locations Rate the pain. Rate the pain. Current Pain Level: 0 Pain Management and Medication Current Pain Management: Medication: No Cold Application: No Rest: No Massage: No Activity: No T.E.N.S.: No Heat Application: No Leg drop or elevation: No Is the Current Pain Management Adequate: Adequate How does your wound impact your activities of daily livingo Sleep: No Bathing: No Appetite: No Relationship With Others: No Bladder Continence: No Emotions: No Bowel Continence: No Work: No Toileting: No Drive: No Dressing: No Hobbies: No Engineer, maintenance) Signed: 02/25/2021 5:54:55 PM By: [name] RN, BSN Entered By: [name] on 02/25/2021 07:57:55 -------------------------------------------------------------------------------- Patient/Caregiver Education Details Patient Name: Date of Service: Joseph Hahn RD, [name] 11/30/2022andnbsp8:00 A M Medical Record Number: [id number] Patient Account Number: [id number] Date of Birth/Gender: Treating RN: 05-24-1970 (50 y.o. [name] Primary Care Physician: [name] Other Clinician: Referring Physician: Treating Physician/Extender: [name], [name] in Treatment: 24 Education Assessment Education Provided To: Patient Education Topics Provided Infection: Methods: Explain/Verbal Responses: Reinforcements needed, State content correctly Wound/Skin Impairment: Methods: Explain/Verbal Responses: Reinforcements needed, State content correctly Electronic Signature(s) Signed: 02/25/2021 4:52:58 PM By: Baruch Gouty RN, BSN Signed: 02/25/2021 4:52:58 PM By:  Baruch Gouty RN, BSN Entered By: Baruch Gouty on 02/25/2021 08:33:44 -------------------------------------------------------------------------------- Wound Assessment Details Patient Name: Date of Service: Joseph Hahn RD, [name] 02/25/2021 8:00 A M Medical Record Number: [id number] Patient Account Number: [id number] Date of Birth/Sex: Treating RN: [birth year] (50 y.o. [name] Primary Care Arlett Goold: [name] Other Clinician: Referring Shuntel Fishburn: Treating Rawan Riendeau/Extender: [name], [name] in Treatment: 24 Wound Status Wound Number: 3 Primary Diabetic Wound/Ulcer of the Lower Extremity Etiology: Wound Location: Right, Proximal, Lateral Foot Wound Open Wounding Event: Gradually Appeared Status: Date Acquired: 10/29/2020 Comorbid Anemia, Coronary Artery Disease, Hypertension, Type II Weeks Of Treatment: 17 History: Diabetes, Osteomyelitis, Neuropathy Clustered Wound: No Photos Wound Measurements Length: (cm) 1.3 Width: (cm) 0.2 Depth: (cm) 1 Area: (cm) 0.204 Volume: (cm) 0.204 % Reduction in Area: -61.9% % Reduction in Volume: -131.8% Epithelialization: None Tunneling: No Undermining: Yes Starting Position (o'clock): 11 Ending Position (o'clock): 2 Maximum Distance: (cm) 0.5 Wound Description Classification: Grade 3 Wound Margin: Thickened Exudate Amount: Medium Exudate Type: Serosanguineous Exudate Color: red, brown Foul Odor After Cleansing: No Slough/Fibrino No Wound Bed Granulation Amount: None Present (0%) Exposed Structure Necrotic Amount: None Present (0%) Fascia Exposed: No Fat Layer (Subcutaneous  Tissue) Exposed: No Tendon Exposed: No Muscle Exposed: No Joint Exposed: No Bone Exposed: No Assessment Notes maceration noted to periwound. Treatment Notes Wound #3 (Foot) Wound Laterality: Right, Lateral, Proximal Cleanser Soap and Water Discharge Instruction: May shower and wash wound with dial antibacterial soap and  water prior to dressing change. Wound Cleanser Discharge Instruction: Cleanse the wound with wound cleanser prior to applying a clean dressing using gauze sponges, not tissue or cotton balls. Peri-Wound Care Sween Lotion (Moisturizing lotion) Discharge Instruction: Apply moisturizing lotion to foot with dressing changes Topical Primary Dressing Iodoform packing strip 1/4 (in) Discharge Instruction: Lightly pack as instructed Secondary Dressing Woven Gauze Sponge, Non-Sterile 4x4 in Discharge Instruction: Apply over primary dressing as directed. ABD Pad, 5x9 Discharge Instruction: Apply over primary dressing as directed. Secured With [employer] 4x5 (in/yd) Discharge Instruction: Secure with Coban as directed. Kerlix Roll Sterile, 4.5x3.1 (in/yd) Discharge Instruction: Secure with Kerlix as directed. Compression Wrap Compression Stockings Add-Ons Electronic Signature(s) Signed: 02/25/2021 5:54:55 PM By: [name] RN, BSN Entered By: [name] on 02/25/2021 08:08:35 -------------------------------------------------------------------------------- Wound Assessment Details Patient Name: Date of Service: Joseph Hahn RD, [name] 02/25/2021 8:00 A M Medical Record Number: [id number] Patient Account Number: [id number] Date of Birth/Sex: Treating RN: 02-04-1971 (50 y.o. [name] Primary Care Semaja Lymon: [name] Other Clinician: Referring Johan Creveling: Treating Bettyann Birchler/Extender: [name], [name] in Treatment: 24 Wound Status Wound Number: 7 Primary Diabetic Wound/Ulcer of the Lower Extremity Etiology: Wound Location: Right, Lateral, Plantar Foot Wound Open Wounding Event: Gradually Appeared Status: Date Acquired: 12/31/2020 Comorbid Anemia, Coronary Artery Disease, Hypertension, Type II Weeks Of Treatment: 8 History: Diabetes, Osteomyelitis, Neuropathy Clustered Wound: No Photos Wound Measurements Length: (cm) 0.5 Width: (cm)  0.4 Depth: (cm) 2.2 Area: (cm) 0.157 Volume: (cm) 0.346 % Reduction in Area: 19.9% % Reduction in Volume: -76.5% Epithelialization: None Tunneling: No Undermining: No Wound Description Classification: Grade 3 Wound Margin: Distinct, outline attached Exudate Amount: Medium Exudate Type: Serosanguineous Exudate Color: red, brown Foul Odor After Cleansing: No Slough/Fibrino No Wound Bed Granulation Amount: Large (67-100%) Exposed Structure Granulation Quality: Red Fascia Exposed: No Necrotic Amount: None Present (0%) Fat Layer (Subcutaneous Tissue) Exposed: Yes Tendon Exposed: No Muscle Exposed: No Joint Exposed: No Bone Exposed: Yes Assessment Notes callous periwound. Treatment Notes Wound #7 (Foot) Wound Laterality: Plantar, Right, Lateral Cleanser Soap and Water Discharge Instruction: May shower and wash wound with dial antibacterial soap and water prior to dressing change. Wound Cleanser Discharge Instruction: Cleanse the wound with wound cleanser prior to applying a clean dressing using gauze sponges, not tissue or cotton balls. Peri-Wound Care Sween Lotion (Moisturizing lotion) Discharge Instruction: Apply moisturizing lotion to foot with dressing changes Topical Primary Dressing Iodoform packing strip 1/4 (in) Discharge Instruction: Lightly pack as instructed Secondary Dressing Woven Gauze Sponge, Non-Sterile 4x4 in Discharge Instruction: Apply over primary dressing as directed. ABD Pad, 5x9 Discharge Instruction: Apply over primary dressing as directed. Secured With [employer] 4x5 (in/yd) Discharge Instruction: Secure with Coban as directed. Kerlix Roll Sterile, 4.5x3.1 (in/yd) Discharge Instruction: Secure with Kerlix as directed. Compression Wrap Compression Stockings Add-Ons Electronic Signature(s) Signed: 02/25/2021 5:54:55 PM By: [name] RN, BSN Entered By: [name] on 02/25/2021  08:09:22 -------------------------------------------------------------------------------- Vitals Details Patient Name: Date of Service: Joseph Hahn RD, [name] 02/25/2021 8:00 A M Medical Record Number: [id number] Patient Account Number: [id number] Date of Birth/Sex: Treating RN: May 20, 1970 (50 y.o. [name] Primary Care Carnelia Oscar: [name] Other Clinician: Referring Rubylee Zamarripa: Treating Colter Magowan/Extender:  Stone III, Nelly Rout, [name] in Treatment: 24 Vital Signs Time Taken: 07:50 Temperature (F): 98.3 Height (in): 65 Pulse (bpm): 102 Weight (lbs): 196 Respiratory Rate (breaths/min): 16 Body Mass Index (BMI): 32.6 Blood Pressure (mmHg): 139/84 Reference Range: 80 - 120 mg / dl Electronic Signature(s) Signed: 02/25/2021 5:54:55 PM By: [name] RN, BSN Entered By: [name] on 02/25/2021 07:57:28

## 2021-02-25 NOTE — Progress Notes (Signed)
KEYMARI, SATO ([id number]) Visit Report for 02/25/2021 [place] Details Patient Name: Date of Service: [place] RD, [name] 02/25/2021 Medical Record Number: [id number] Patient Account Number: [id number] Date of Birth/Sex: Treating RN: July 26, 1970 (50 y.o. [name], [name] Primary Care Provider: [name] Other Clinician: [name] Referring Provider: Treating Provider/Extender: [name], [name] in Treatment: 24 Diagnosis Coding ICD-10 Codes Code Description [phone] Other chronic osteomyelitis, right ankle and foot E11.621 Type 2 diabetes mellitus with foot ulcer L97.514 Non-pressure chronic ulcer of other part of right foot with necrosis of bone T81.31XS Disruption of external operation (surgical) wound, not elsewhere classified, sequela F17.218 Nicotine dependence, cigarettes, with other nicotine-induced disorders L97.522 Non-pressure chronic ulcer of other part of left foot with fat layer exposed L84 Corns and callosities Facility Procedures CPT4 Code Description Modifier Quantity [id number] G0277-(Facility Use Only) HBOT full body chamber, 61min , 4 ICD-10 Diagnosis Description M86.671 Other chronic osteomyelitis, right ankle and foot E11.621 Type 2 diabetes mellitus with foot ulcer L97.514 Non-pressure chronic ulcer of other part of right foot with necrosis of bone Physician Procedures Quantity CPT4 Code Description Modifier [id number] 75883 - WC PHYS HYPERBARIC OXYGEN THERAPY 1 ICD-10 Diagnosis Description M86.671 Other chronic osteomyelitis, right ankle and foot E11.621 Type 2 diabetes mellitus with foot ulcer L97.514 Non-pressure chronic ulcer of other part of right foot with necrosis of bone Electronic Signature(s) Signed: 02/25/2021 12:40:56 PM By: [name] [name] , , Signed: 02/25/2021 6:11:30 PM By: Worthy Keeler PA-C Entered By: [name] on 02/25/2021 12:40:54

## 2021-02-25 NOTE — Progress Notes (Addendum)
BERNELL, SIGAL ([id number]) Visit Report for 02/24/2021 Arrival Information Details Patient Name: Date of Service: [place] RD, [name] 02/24/2021 9:45 A M Medical Record Number: [id number] Patient Account Number: [id number] Date of Birth/Sex: Treating RN: 1971/01/29 (50 y.o. [name] Primary Care Brittiney Dicostanzo: [name] Other Clinician: [name] Referring Cannan Beeck: Treating Medardo Hassing/Extender: [name], [name] in Treatment: 24 Visit Information History Since Last Visit All ordered tests and consults were completed: Yes Patient Arrived: [name] Added or deleted any medications: No Arrival Time: 11:54 Any new allergies or adverse reactions: No Accompanied By: self Had a fall or experienced change in No Transfer Assistance: None activities of daily living that may affect Patient Identification Verified: Yes risk of falls: Secondary Verification Process Completed: Yes Signs or symptoms of abuse/neglect since last visito No Patient Requires Transmission-Based Precautions: No Hospitalized since last visit: No Patient Has Alerts: No Implantable device outside of the clinic excluding No cellular tissue based products placed in the center since last visit: Pain Present Now: No Electronic Signature(s) Signed: 02/25/2021 6:45:30 PM By: [name] [name] , , Previous Signature: 02/24/2021 2:49:53 PM Version By: [name] [name] , , Entered By: [name] on 02/25/2021 18:45:30 -------------------------------------------------------------------------------- Encounter Discharge Information Details Patient Name: Date of Service: [name], [place]. 02/24/2021 9:45 A M Medical Record Number: [id number] Patient Account Number: [id number] Date of Birth/Sex: Treating RN: [birth year] (50 y.o. [name] Primary Care Brycen Bean: [name] Other Clinician: [name] Referring Kamber Vignola: Treating  Tirrell Buchberger/Extender: [name], [name] in Treatment: 24 Encounter Discharge Information Items Discharge Condition: Stable Ambulatory Status: Cane Discharge Destination: Home Transportation: Private Auto Accompanied By: self Schedule Follow-up Appointment: No Clinical Summary of Care: Electronic Signature(s) Signed: 02/24/2021 2:59:09 PM By: [name] [name] , , Entered By: [name] on 02/24/2021 14:59:09 -------------------------------------------------------------------------------- [place] Details Patient Name: Date of Service: [name], [place] 02/24/2021 9:45 A M Medical Record Number: [id number] Patient Account Number: [id number] Date of Birth/Sex: Treating RN: 26-Apr-1970 (50 y.o. [name] Primary Care Ottavio Norem: [name] Other Clinician: [name] Referring Jaksen Fiorella: Treating Evelio Rueda/Extender: [name], [name] in Treatment: 24 Vital Signs Time Taken: 14:40 Temperature (F): 97.6 Height (in): 65 Pulse (bpm): 84 Weight (lbs): 196 Respiratory Rate (breaths/min): 16 Body Mass Index (BMI): 32.6 Blood Pressure (mmHg): 166/104 Capillary Blood Glucose (mg/dl): 141 Reference Range: 80 - 120 mg / dl Electronic Signature(s) Signed: 02/24/2021 2:51:19 PM By: [name] [name] , , Entered By: [name] on 02/24/2021 14:51:19

## 2021-02-25 NOTE — Progress Notes (Addendum)
[name], [name] ([id number]) Visit Report for 02/25/2021 Arrival Information Details Patient Name: Date of Service: [place] RD, [name] 02/25/2021 8:45 A M Medical Record Number: [id number] Patient Account Number: [id number] Date of Birth/Sex: Treating RN: [birth year] (50 y.o. [name], Meta.Reding Primary Care Eknoor Novack: [name] Other Clinician: [name] Referring Jannifer Fischler: Treating Joanette Silveria/Extender: [name], [name] in Treatment: 24 Visit Information History Since Last Visit All ordered tests and consults were completed: Yes Patient Arrived: Cane Added or deleted any medications: No Arrival Time: 07:45 Any new allergies or adverse reactions: No Accompanied By: self Had a fall or experienced change in No Transfer Assistance: Other activities of daily living that may affect Patient Identification Verified: Yes risk of falls: Secondary Verification Process Completed: Yes Signs or symptoms of abuse/neglect since last visito No Patient Requires Transmission-Based Precautions: No Hospitalized since last visit: No Patient Has Alerts: No Implantable device outside of the clinic excluding No cellular tissue based products placed in the center since last visit: Pain Present Now: No Electronic Signature(s) Signed: 02/25/2021 11:22:04 AM By: [name] [name] , , Entered By: [name] on 02/25/2021 11:22:03 -------------------------------------------------------------------------------- Encounter Discharge Information Details Patient Name: Date of Service: [name], [name] 02/25/2021 8:45 A M Medical Record Number: [id number] Patient Account Number: [id number] Date of Birth/Sex: Treating RN: Aug 28, 1970 (50 y.o. [name] Primary Care Helana Macbride: [name] Other Clinician: [name] Referring Maritza Hosterman: Treating Fernando Torry/Extender: [name], [name] in Treatment: 24 Encounter Discharge  Information Items Discharge Condition: Stable Ambulatory Status: Cane Discharge Destination: Home Transportation: Private Auto Accompanied By: self Schedule Follow-up Appointment: No Clinical Summary of Care: Electronic Signature(s) Signed: 02/25/2021 12:41:28 PM By: [name] [name] , , Entered By: [name] on 02/25/2021 12:41:28 -------------------------------------------------------------------------------- [place] Details Patient Name: Date of Service: [name], [place] 02/25/2021 8:45 A M Medical Record Number: [id number] Patient Account Number: [id number] Date of Birth/Sex: Treating RN: [birth year] (50 y.o. [name], [name] Primary Care Eduard Penkala: [name] Other Clinician: [name] Referring Deliyah Muckle: Treating Keondrick Dilks/Extender: [name], [name] in Treatment: 24 Vital Signs Time Taken: 09:06 Temperature (F): 98.3 Height (in): 65 Pulse (bpm): 102 Weight (lbs): 196 Respiratory Rate (breaths/min): 16 Body Mass Index (BMI): 32.6 Blood Pressure (mmHg): 139/84 Capillary Blood Glucose (mg/dl): 119 Reference Range: 80 - 120 mg / dl Electronic Signature(s) Signed: 02/25/2021 12:36:51 PM By: [name] [name] , , Entered By: [name] on 02/25/2021 12:36:51

## 2021-03-02 ENCOUNTER — Other Ambulatory Visit: Payer: Self-pay

## 2021-03-02 ENCOUNTER — Encounter (HOSPITAL_BASED_OUTPATIENT_CLINIC_OR_DEPARTMENT_OTHER): Payer: 59 | Attending: Internal Medicine | Admitting: Internal Medicine

## 2021-03-02 DIAGNOSIS — X58XXXS Exposure to other specified factors, sequela: Secondary | ICD-10-CM | POA: Diagnosis not present

## 2021-03-02 DIAGNOSIS — Z89422 Acquired absence of other left toe(s): Secondary | ICD-10-CM | POA: Insufficient documentation

## 2021-03-02 DIAGNOSIS — E1122 Type 2 diabetes mellitus with diabetic chronic kidney disease: Secondary | ICD-10-CM | POA: Insufficient documentation

## 2021-03-02 DIAGNOSIS — Z89421 Acquired absence of other right toe(s): Secondary | ICD-10-CM | POA: Diagnosis not present

## 2021-03-02 DIAGNOSIS — E11621 Type 2 diabetes mellitus with foot ulcer: Secondary | ICD-10-CM | POA: Diagnosis not present

## 2021-03-02 DIAGNOSIS — I129 Hypertensive chronic kidney disease with stage 1 through stage 4 chronic kidney disease, or unspecified chronic kidney disease: Secondary | ICD-10-CM | POA: Insufficient documentation

## 2021-03-02 DIAGNOSIS — L97514 Non-pressure chronic ulcer of other part of right foot with necrosis of bone: Secondary | ICD-10-CM | POA: Insufficient documentation

## 2021-03-02 DIAGNOSIS — E1169 Type 2 diabetes mellitus with other specified complication: Secondary | ICD-10-CM | POA: Diagnosis present

## 2021-03-02 DIAGNOSIS — T8131XS Disruption of external operation (surgical) wound, not elsewhere classified, sequela: Secondary | ICD-10-CM | POA: Insufficient documentation

## 2021-03-02 DIAGNOSIS — M86671 Other chronic osteomyelitis, right ankle and foot: Secondary | ICD-10-CM | POA: Insufficient documentation

## 2021-03-02 DIAGNOSIS — L97522 Non-pressure chronic ulcer of other part of left foot with fat layer exposed: Secondary | ICD-10-CM | POA: Diagnosis not present

## 2021-03-02 DIAGNOSIS — L84 Corns and callosities: Secondary | ICD-10-CM | POA: Insufficient documentation

## 2021-03-02 DIAGNOSIS — E1140 Type 2 diabetes mellitus with diabetic neuropathy, unspecified: Secondary | ICD-10-CM | POA: Diagnosis not present

## 2021-03-02 DIAGNOSIS — N183 Chronic kidney disease, stage 3 unspecified: Secondary | ICD-10-CM | POA: Diagnosis not present

## 2021-03-02 DIAGNOSIS — Z8619 Personal history of other infectious and parasitic diseases: Secondary | ICD-10-CM | POA: Insufficient documentation

## 2021-03-02 DIAGNOSIS — F17218 Nicotine dependence, cigarettes, with other nicotine-induced disorders: Secondary | ICD-10-CM | POA: Diagnosis not present

## 2021-03-03 ENCOUNTER — Ambulatory Visit (INDEPENDENT_AMBULATORY_CARE_PROVIDER_SITE_OTHER): Payer: 59 | Admitting: Internal Medicine

## 2021-03-03 ENCOUNTER — Other Ambulatory Visit: Payer: Self-pay

## 2021-03-03 ENCOUNTER — Encounter: Payer: Self-pay | Admitting: Internal Medicine

## 2021-03-03 VITALS — BP 143/88 | HR 96 | Temp 97.9°F | Wt 197.0 lb

## 2021-03-03 DIAGNOSIS — E11621 Type 2 diabetes mellitus with foot ulcer: Secondary | ICD-10-CM | POA: Diagnosis not present

## 2021-03-03 DIAGNOSIS — L97519 Non-pressure chronic ulcer of other part of right foot with unspecified severity: Secondary | ICD-10-CM

## 2021-03-03 NOTE — Progress Notes (Signed)
TRAVIN, MARIK ([id number]) Visit Report for 03/02/2021 SuperBill Details Patient Name: Date of Service: [place] RD, [name] 03/02/2021 Medical Record Number: [id number] Patient Account Number: [id number] Date of Birth/Sex: Treating RN: 07/08/1970 (50 y.o. [name] Primary Care Provider: [name] Other Clinician: Referring Provider: Treating Provider/Extender: [name], [name] in Treatment: 25 Diagnosis Coding ICD-10 Codes Code Description [phone] Other chronic osteomyelitis, right ankle and foot E11.621 Type 2 diabetes mellitus with foot ulcer L97.514 Non-pressure chronic ulcer of other part of right foot with necrosis of bone T81.31XS Disruption of external operation (surgical) wound, not elsewhere classified, sequela F17.218 Nicotine dependence, cigarettes, with other nicotine-induced disorders L97.522 Non-pressure chronic ulcer of other part of left foot with fat layer exposed L84 Corns and callosities Facility Procedures CPT4 Code Description Modifier Quantity [id number] 99213 - WOUND CARE VISIT-LEV 3 EST PT 1 Electronic Signature(s) Signed: 03/02/2021 2:38:54 PM By: [name] RN, BSN Signed: 03/03/2021 3:41:17 PM By: Kalman Shan DO Entered By: [name] on 03/02/2021 11:33:35

## 2021-03-03 NOTE — Progress Notes (Signed)
NAITHEN, RIVENBURG ([id number]) Visit Report for 03/02/2021 Arrival Information Details Patient Name: Date of Service: [address] RD, [name] 03/02/2021 11:15 A M Medical Record Number: [id number] Patient Account Number: [id number] Date of Birth/Sex: Treating RN: May 25, 1970 (50 y.o. [name] Primary Care Odella Appelhans: [name] Other Clinician: Referring Tramond Slinker: Treating Abbe Bula/Extender: [name], [name] in Treatment: 25 Visit Information History Since Last Visit Added or deleted any medications: No Patient Arrived: [name] Any new allergies or adverse reactions: No Arrival Time: 11:01 Had a fall or experienced change in No Transfer Assistance: None activities of daily living that may affect Patient Identification Verified: Yes risk of falls: Secondary Verification Process Completed: Yes Signs or symptoms of abuse/neglect since last visito No Patient Requires Transmission-Based Precautions: No Hospitalized since last visit: No Patient Has Alerts: No Implantable device outside of the clinic excluding No cellular tissue based products placed in the center since last visit: Has Dressing in Place as Prescribed: Yes Pain Present Now: No Electronic Signature(s) Signed: 03/03/2021 3:40:48 PM By: Lorrin Jackson Entered By: Lorrin Jackson on 03/02/2021 11:01:36 -------------------------------------------------------------------------------- Clinic Level of Care Assessment Details Patient Name: Date of Service: [address] RD, [name] 03/02/2021 11:15 A M Medical Record Number: [id number] Patient Account Number: [id number] Date of Birth/Sex: Treating RN: 07-18-1970 (50 y.o. [name] Primary Care Ashvik Grundman: [name] Other Clinician: Referring Lacrystal Barbe: Treating Dearia Wilmouth/Extender: [name], [name] in Treatment: 25 Clinic Level of Care Assessment Items TOOL 4 Quantity Score X- 1 0 Use when only an EandM is performed on FOLLOW-UP  visit ASSESSMENTS - Nursing Assessment / Reassessment X- 1 10 Reassessment of Co-morbidities (includes updates in patient status) X- 1 5 Reassessment of Adherence to Treatment Plan ASSESSMENTS - Wound and Skin A ssessment / Reassessment []  - 0 Simple Wound Assessment / Reassessment - one wound X- 2 5 Complex Wound Assessment / Reassessment - multiple wounds []  - 0 Dermatologic / Skin Assessment (not related to wound area) ASSESSMENTS - Focused Assessment []  - 0 Circumferential Edema Measurements - multi extremities []  - 0 Nutritional Assessment / Counseling / Intervention []  - 0 Lower Extremity Assessment (monofilament, tuning fork, pulses) []  - 0 Peripheral Arterial Disease Assessment (using hand held doppler) ASSESSMENTS - Ostomy and/or Continence Assessment and Care []  - 0 Incontinence Assessment and Management []  - 0 Ostomy Care Assessment and Management (repouching, etc.) PROCESS - Coordination of Care []  - 0 Simple Patient / Family Education for ongoing care X- 1 20 Complex (extensive) Patient / Family Education for ongoing care X- 1 10 Staff obtains Programmer, systems, Records, T Results / Process Orders est []  - 0 Staff telephones HHA, Nursing Homes / Clarify orders / etc []  - 0 Routine Transfer to another Facility (non-emergent condition) []  - 0 Routine Hospital Admission (non-emergent condition) []  - 0 New Admissions / Biomedical engineer / Ordering NPWT Apligraf, etc. , []  - 0 Emergency Hospital Admission (emergent condition) []  - 0 Simple Discharge Coordination X- 1 15 Complex (extensive) Discharge Coordination PROCESS - Special Needs []  - 0 Pediatric / Minor Patient Management []  - 0 Isolation Patient Management []  - 0 Hearing / Language / Visual special needs []  - 0 Assessment of Community assistance (transportation, D/C planning, etc.) []  - 0 Additional assistance / Altered mentation []  - 0 Support Surface(s) Assessment (bed, cushion, seat,  etc.) INTERVENTIONS - Wound Cleansing / Measurement []  - 0 Simple Wound Cleansing - one wound X- 2 5 Complex Wound Cleansing - multiple wounds []  - 0 Wound  Imaging (photographs - any number of wounds) []  - 0 Wound Tracing (instead of photographs) []  - 0 Simple Wound Measurement - one wound X- 1 5 Complex Wound Measurement - multiple wounds INTERVENTIONS - Wound Dressings []  - 0 Small Wound Dressing one or multiple wounds X- 1 15 Medium Wound Dressing one or multiple wounds []  - 0 Large Wound Dressing one or multiple wounds []  - 0 Application of Medications - topical X- 1 10 Application of Medications - injection INTERVENTIONS - Miscellaneous []  - 0 External ear exam []  - 0 Specimen Collection (cultures, biopsies, blood, body fluids, etc.) []  - 0 Specimen(s) / Culture(s) sent or taken to Lab for analysis []  - 0 Patient Transfer (multiple staff / Civil Service fast streamer / Similar devices) []  - 0 Simple Staple / Suture removal (25 or less) []  - 0 Complex Staple / Suture removal (26 or more) []  - 0 Hypo / Hyperglycemic Management (close monitor of Blood Glucose) []  - 0 Ankle / Brachial Index (ABI) - do not check if billed separately X- 1 5 Vital Signs Has the patient been seen at the hospital within the last three years: Yes Total Score: 115 Level Of Care: New/Established - Level 3 Electronic Signature(s) Signed: 03/02/2021 2:38:54 PM By: [name] RN, BSN Entered By: [name] on 03/02/2021 11:33:21 -------------------------------------------------------------------------------- Encounter Discharge Information Details Patient Name: Date of Service: [name], [place] [name] 03/02/2021 11:15 A M Medical Record Number: [id number] Patient Account Number: [id number] Date of Birth/Sex: Treating RN: [birth year] (50 y.o. Collene Gobble Primary Care Christmas Faraci: [name] Other Clinician: Referring Gurbani Figge: Treating Chanon Loney/Extender: [name], [name] in  Treatment: 25 Encounter Discharge Information Items Discharge Condition: Stable Ambulatory Status: Ambulatory Discharge Destination: Home Transportation: Private Auto Accompanied By: self Schedule Follow-up Appointment: Yes Clinical Summary of Care: Patient Declined Electronic Signature(s) Signed: 03/03/2021 5:02:41 PM By: Dellie Catholic RN Entered By: Dellie Catholic on 03/02/2021 11:39:17 -------------------------------------------------------------------------------- Patient/Caregiver Education Details Patient Name: Date of Service: [name], [place] 12/5/2022andnbsp11:15 A M Medical Record Number: [id number] Patient Account Number: [id number] Date of Birth/Gender: Treating RN: September 21, 1970 (50 y.o. Collene Gobble Primary Care Physician: [name] Other Clinician: Referring Physician: Treating Physician/Extender: Katy Fitch in Treatment: 25 Education Assessment Education Provided To: Patient Education Topics Provided Pain: Methods: Explain/Verbal Electronic Signature(s) Signed: 03/03/2021 5:02:41 PM By: Dellie Catholic RN Entered By: Dellie Catholic on 03/02/2021 11:38:54 -------------------------------------------------------------------------------- Wound Assessment Details Patient Name: Date of Service: [name], [place] [name] 03/02/2021 11:15 A M Medical Record Number: [id number] Patient Account Number: [id number] Date of Birth/Sex: Treating RN: [birth year] (50 y.o. Collene Gobble Primary Care Shaydon Lease: [name] Other Clinician: Referring Vinette Crites: Treating Jaclynn Laumann/Extender: [name], [name] in Treatment: 25 Wound Status Wound Number: 3 Primary Diabetic Wound/Ulcer of the Lower Extremity Etiology: Wound Location: Right, Proximal, Lateral Foot Wound Open Wounding Event: Gradually Appeared Status: Date Acquired: 10/29/2020 Comorbid Anemia, Coronary Artery Disease, Hypertension, Type II Weeks Of  Treatment: 17 History: Diabetes, Osteomyelitis, Neuropathy Clustered Wound: No Wound Measurements Length: (cm) 1.3 Width: (cm) 0.2 Depth: (cm) 1 Area: (cm) 0.204 Volume: (cm) 0.204 % Reduction in Area: -61.9% % Reduction in Volume: -131.8% Epithelialization: None Wound Description Classification: Grade 3 Wound Margin: Thickened Exudate Amount: Medium Exudate Type: Serosanguineous Exudate Color: red, brown Foul Odor After Cleansing: No Slough/Fibrino No Wound Bed Granulation Amount: None Present (0%) Exposed Structure Necrotic Amount: None Present (0%) Fascia Exposed: No Fat Layer (Subcutaneous Tissue) Exposed: No Tendon Exposed: No Muscle Exposed:  No Joint Exposed: No Bone Exposed: No Treatment Notes Wound #3 (Foot) Wound Laterality: Right, Lateral, Proximal Cleanser Soap and Water Discharge Instruction: May shower and wash wound with dial antibacterial soap and water prior to dressing change. Wound Cleanser Discharge Instruction: Cleanse the wound with wound cleanser prior to applying a clean dressing using gauze sponges, not tissue or cotton balls. Peri-Wound Care Sween Lotion (Moisturizing lotion) Discharge Instruction: Apply moisturizing lotion to foot with dressing changes Topical Primary Dressing Iodoform packing strip 1/4 (in) Discharge Instruction: Lightly pack as instructed Secondary Dressing Woven Gauze Sponge, Non-Sterile 4x4 in Discharge Instruction: Apply over primary dressing as directed. ABD Pad, 5x9 Discharge Instruction: Apply over primary dressing as directed. Secured With [employer] 4x5 (in/yd) Discharge Instruction: Secure with Coban as directed. Kerlix Roll Sterile, 4.5x3.1 (in/yd) Discharge Instruction: Secure with Kerlix as directed. Compression Wrap Compression Stockings Add-Ons Electronic Signature(s) Signed: 03/03/2021 5:02:41 PM By: Dellie Catholic RN Entered By: Dellie Catholic on 03/02/2021  11:33:59 -------------------------------------------------------------------------------- Wound Assessment Details Patient Name: Date of Service: [name], [place] [name] 03/02/2021 11:15 A M Medical Record Number: [id number] Patient Account Number: [id number] Date of Birth/Sex: Treating RN: 13-Oct-1970 (50 y.o. Collene Gobble Primary Care Mirna Sutcliffe: [name] Other Clinician: Referring Tausha Milhoan: Treating Karson Chicas/Extender: [name], [name] in Treatment: 25 Wound Status Wound Number: 7 Primary Diabetic Wound/Ulcer of the Lower Extremity Etiology: Wound Location: Right, Lateral, Plantar Foot Wound Open Wounding Event: Gradually Appeared Status: Date Acquired: 12/31/2020 Comorbid Anemia, Coronary Artery Disease, Hypertension, Type II Weeks Of Treatment: 8 History: Diabetes, Osteomyelitis, Neuropathy Clustered Wound: No Wound Measurements Length: (cm) 0.5 Width: (cm) 0.4 Depth: (cm) 2.2 Area: (cm) 0.157 Volume: (cm) 0.346 % Reduction in Area: 19.9% % Reduction in Volume: -76.5% Epithelialization: None Wound Description Classification: Grade 3 Wound Margin: Distinct, outline attached Exudate Amount: Medium Exudate Type: Serosanguineous Exudate Color: red, brown Foul Odor After Cleansing: No Slough/Fibrino No Wound Bed Granulation Amount: Large (67-100%) Exposed Structure Granulation Quality: Red Fascia Exposed: No Necrotic Amount: None Present (0%) Fat Layer (Subcutaneous Tissue) Exposed: Yes Tendon Exposed: No Muscle Exposed: No Joint Exposed: No Bone Exposed: Yes Treatment Notes Wound #7 (Foot) Wound Laterality: Plantar, Right, Lateral Cleanser Soap and Water Discharge Instruction: May shower and wash wound with dial antibacterial soap and water prior to dressing change. Wound Cleanser Discharge Instruction: Cleanse the wound with wound cleanser prior to applying a clean dressing using gauze sponges, not tissue or  cotton balls. Peri-Wound Care Sween Lotion (Moisturizing lotion) Discharge Instruction: Apply moisturizing lotion to foot with dressing changes Topical Primary Dressing Iodoform packing strip 1/4 (in) Discharge Instruction: Lightly pack as instructed Secondary Dressing Woven Gauze Sponge, Non-Sterile 4x4 in Discharge Instruction: Apply over primary dressing as directed. ABD Pad, 5x9 Discharge Instruction: Apply over primary dressing as directed. Secured With [employer] 4x5 (in/yd) Discharge Instruction: Secure with Coban as directed. Kerlix Roll Sterile, 4.5x3.1 (in/yd) Discharge Instruction: Secure with Kerlix as directed. Compression Wrap Compression Stockings Add-Ons Electronic Signature(s) Signed: 03/03/2021 5:02:41 PM By: Dellie Catholic RN Entered By: Dellie Catholic on 03/02/2021 11:34:09 -------------------------------------------------------------------------------- Vitals Details Patient Name: Date of Service: [name], [place] [name] 03/02/2021 11:15 A M Medical Record Number: [id number] Patient Account Number: [id number] Date of Birth/Sex: Treating RN: September 07, 1970 (50 y.o. [name] Primary Care Rilynn Habel: [name] Other Clinician: Referring Lenus Trauger: Treating Shenae Bonanno/Extender: [name], [name] in Treatment: 25 Vital Signs Time Taken: 11:01 Temperature (F): 98 Height (in): 65 Pulse (bpm): 99 Weight (lbs): 196 Respiratory Rate (  breaths/min): 16 Body Mass Index (BMI): 32.6 Blood Pressure (mmHg): 156/95 Capillary Blood Glucose (mg/dl): 140 Reference Range: 80 - 120 mg / dl Electronic Signature(s) Signed: 03/03/2021 3:40:48 PM By: Lorrin Jackson Entered By: Lorrin Jackson on 03/02/2021 11:04:40
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
## 2021-03-03 NOTE — Patient Instructions (Signed)
Please continue wound care  There is no sign of worsening infection where we have to treat. If that does occur, (purulence, ulcer enlarging, foot swelling, fever/chill), you'll have to discuss with dr Sharol Given or [name] for surgical debridement  At this time no indication for antibiotics  Follow up with me as needed

## 2021-03-04 ENCOUNTER — Encounter (HOSPITAL_BASED_OUTPATIENT_CLINIC_OR_DEPARTMENT_OTHER): Payer: 59 | Admitting: Physician Assistant

## 2021-03-04 ENCOUNTER — Other Ambulatory Visit: Payer: Self-pay

## 2021-03-04 DIAGNOSIS — E1169 Type 2 diabetes mellitus with other specified complication: Secondary | ICD-10-CM | POA: Diagnosis not present

## 2021-03-04 NOTE — Progress Notes (Addendum)
VIRGINIO, [name] ([id number]) Visit Report for 03/04/2021 Chief Complaint Document Details Patient Name: Date of Service: [name], [place] 03/04/2021 1:15 PM Medical Record Number: [id number] Patient Account Number: [id number] Date of Birth/Sex: Treating RN: [name] (50 y.o. [name] Primary Care Provider: Durene [name] Other Clinician: Referring Provider: Treating Provider/Extender: [name], [name] in Treatment: 25 Information Obtained from: Patient Chief Complaint 09/05/2020; patient is here for review of the wound extensively on a right TMA amputation site. He also has a wound on the plantar aspect of the left foot Electronic Signature(s) Signed: 03/04/2021 1:44:54 PM By: Joseph Keeler PA-C Entered By: [name] on 03/04/2021 13:44:53 -------------------------------------------------------------------------------- HPI Details Patient Name: Date of Service: [name], [place] 03/04/2021 1:15 PM Medical Record Number: [id number] Patient Account Number: [id number] Date of Birth/Sex: Treating RN: [name] (50 y.o. [name] Primary Care Provider: Durene [name] Other Clinician: Referring Provider: Treating Provider/Extender: [name], [name] in Treatment: 25 History of Present Illness HPI Description: ADMISSION 09/05/2020 This is a 50 year old man who has type 2 diabetes. Essentially the problem began admitted to [facility] in December with infection gas gangrene. Cultures ultimately grew staph lugdunensis. 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 Shortly thereafter he developed a nonhealing wound with wound dehiscence noted on 05/09/2020. He had a wound VAC I think for a period of time after the surgery but it did not help. He has undergone an IandD and skin graft on the right foot on 05/16/2020. He underwent a further IandD on 06/04/2020 not debrided to bone. Noted to have  exposed bone with osteomyelitis on 07/29/2020 he underwent an operative debridement with resection of the metatarsal. Culture of the metatarsal grew Enterobacter cloacae I. He has been followed by [name] of infectious disease. Currently on Levaquin and doxycycline I think at about the 4-week of 6 mark. At the last visit with [name] he was supposed to have use Santyl although I do not think he has it and he has been using I think a Betadine wet-to-dry. He is referred here by [name] for our review who saw him yesterday. I cannot see that he has had imaging studies of the right foot. No MRI Past medical history includes type 2 diabetes, in 2021 and ulcer of the left foot that ultimately healed with a wound VAC, stage III chronic kidney disease, first toe amputations bilaterally, hypertension, still a 1/2 pack/day smoker. The patient had ABIs on 08/09/2018 which was 1.00 on the right and 0.96 on the left both areas had triphasic waveforms. Not felt to have an arterial issue. 09/12/2020; 1 week follow-up. He has been doing the dressings along with home health. This is a deep punched out area in the incision of his transmit site. We use silver alginate last week to help with drainage and antibacterial properties. He tolerated this well 6/24; the patient was actually at his podiatrist this morning who debrided the wound. We have been using silver alginate. They are going to follow him in 6 [name]. 6/29; patient is using silver alginate. Apparently saw [name] this week although I have not checked his note I will try to do so. He is following up in 6 [name]. This was the original transmetatarsal amputation when I first saw this 3 mid part of his amputation site was deep down to bone however this seems to have progressively close down which is gratifying  to see. He still has a fairly callus uneven surface however spreading this apart its difficult to identify anything that does not look epithelialized. When I  first saw this I thought he would require an extensive debridement perhaps back in the OR by [name] however all of this appears to be looking better and at this point I would continue with the silver alginate see if this holds together over the next several [name]. He does not have an arterial issue 7/13; 2-week follow-up. Unfortunately the area did not hold together there was drainage on his dressing. Still copious amounts of callus. It was difficult to see where this actually was open therefore I elected to go ahead with a vigorous debridement. Still using silver alginate 7/20 I brought him back after extensive callus debridement last week. The only open area was on the medial part of the foot extensive debridement of thick surrounding callus and I was able to do identify the remaining tunneling area. 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 This is the area that we Joseph have been following. This is smaller this week but still surrounded with thick callus He came in today with a large area of callus on the left midfoot. This I had noticed previously. HOWEVER our intake nurse clearly incorrectly documented this as opening. The callus and split there was an odor so all of this had to be removed. 10/29/2020 unfortunately upon evaluation today this patient appears to have significant mount of callus noted at this point. There does not appear to be any signs of active infection systemically which is great although locally he is having some discomfort around the lateral portion of his foot on the right. The left foot is no having a lot of callus I am not certain even has an open wound if there is anything is extremely tiny. 11/05/2020 upon evaluation today patient's wounds actually appear to be doing a bit better compared to last week since we have uncovered some of these areas I think there are some definite improvements. With that being  said there is still quite a bit of tunneling and undermining at many locations that again has me concerned I still think that the MRI is the best thing to do he has that scheduled for Saturday. 11/26/2020 upon evaluation today patient appears to be doing decently well in regard to his foot in general. He did have an MRI I did review this and he had multiple findings on MRI consistent with osteomyelitis across the board in regard pretty much to his remaining metatarsal regions. Nonetheless there was also some evidence of some involvement of the navicular bone as well. Either way I think that this patient actually is in desperate need of get him in for hyperbaric oxygen therapy we discussed that today that will be detailed in the plan. Nonetheless he is also still on the doxycycline which I think is appropriate for the time being as well I did go ahead and place him on that following the results of the MRI based on what we were seeing. I gave him that prescription for 14 days for the time being and we will go from there. 12/03/2020 upon evaluation today patient appears to be doing decently well in regard to his wounds in general in regard to the right foot. With that being said he does have a callus on the left foot in the end there was actually  an area open area here that will need to be addressed. This is small and I think we take care of it now hopefully will heal quite readily. Fortunately I do not see any signs of infection worsening I am getting need to refill the antibiotic for him today. He has been taking doxycycline which seems to be doing well. We will also get a go ahead and get him approved for HBO therapy. 12/31/2020 upon evaluation today patient appears to be doing really roughly the same as compared to last time I saw him. Fortunately there is no evidence of active infection which is great news systemically he has started hyperbarics he seems to be doing decently well he said a couple times  where anxiety kind of got to him but overall I think he is doing awesome. He is no longer on the antibiotic therapy. With that being said I do believe that he is going to need likely some additional antibiotics based on what I am seeing today we can actually obtain a bone culture from the plantar wound that was hiding underneath the callus. He was having pain in this area which is what Joseph be try to clear away the callus to look sure enough there was an actual opening that went deeper into the wound bed region here. 01/21/2021 upon evaluation today patient appears to be doing about the same in regard to his foot in general. He does have some depth to some of the wound areas I really think he would benefit from is trying to see about getting him set up for a gauze VAC. I think that the gauze VAC could be helpful for him and will work much more effectively with what we are seeing as compared to traditional foam wound VAC. The patient is in agreement with giving this a trial. Nonetheless unfortunately think that he overall seems to be doing really well and I think we are headed in the right direction as far as the overall healing is concerned. 01/28/2021 upon evaluation today patient appears to be doing well with regard to his wound all things considered he does have a lot of callus buildup especially on the plantar aspect of the left foot. There is no wound here but a significant amount of callus. That is getting need to be addressed today. Subsequently he also has a lot of callus on the right foot as well as some of the area around the wounds actually being significantly callused. Again I need to clear this away today. 02/04/2021 upon evaluation today patient actually seems to be doing quite well with regard to his foot ulcer. I do believe that the hyperbaric oxygen therapy has been beneficial for him thus far also think that he is doing well with the wound VAC. In combination I think he has an excellent  plan at this time as far as treatment is concerned. I do not see any signs of evidence of infection which is great news as well. 02/11/2021 upon evaluation today patient appears to be doing okay in regard to his foot ulcer though still has deep wounds that do not seem to be doing nearly as good as what I would like to see. I think that the wound VAC is helping but I do believe there still bone in the base of the wound I think we probably need to see about put him back on an antibiotic, do a PCR culture today to see what we find in order to determine whether or  not we need to have him on antibiotics which I think we probably do is just a matter which 1. 02/18/2021 upon evaluation today patient appears to be doing okay with regard to his foot ulcer. He has been using the wound VAC although there is a lot of drainage and to be honest I am not certain this is doing as well as we wanted to right now especially considering the fact the soles of gotten so small which in some ways is good but in other ways is still problematic for Sorbact standpoint. I am not even certain we will be able to reinitiate the VAC even next week. Nonetheless for now he is on the [place] which is definitely something that we need to get going I am also can see about a referral back to infectious disease to see what they have to say in regard to what is going on currently as well. He voiced understanding and is in agreement with that plan. I did explain this is a very touchy go situation which hinges on [place] getting this healed and the infection improved if not he is looking at amputation. Obviously this is a limb salvage Plan at this point. 02/25/2021 upon evaluation today patient's wound bed actually showed signs of being much drier and much less macerated compared to last week. We had a wound VAC on hold for this week. With that being said based on what I am seeing I think we probably just want to cancel the wound VAC altogether. I  do not think it was doing enough for him to warrant the maceration and risks that are associated. He is on the [place] at this point. He also has an infection disease appointment next week. We will see what they have to say at this juncture. Nonetheless currently he is wrapping up his first round of hyperbaric oxygen therapy we could consider repeat although the biggest issue right now is simply that he actually is having surgery on his limbs due to cataract issues that is tomorrow. Following that surgery I am not certain he really needs to be in the chamber for some period of time we need to see what his surgeon says. 03/04/2021 upon evaluation today patient's foot actually does appear to be doing better which is great news. I do not see any signs of infection he has been tolerating the [place] though he is done with that currently I think we probably need to reopen this for a little bit longer to ensure that he is completely treated. He is in agreement with this plan. Subsequently I will send that into the pharmacy for him today. Electronic Signature(s) Signed: 03/04/2021 1:59:43 PM By: Joseph Keeler PA-C Entered By: [name] on 03/04/2021 13:59:43 -------------------------------------------------------------------------------- Physical Exam Details Patient Name: Date of Service: Joseph RD, [name] 03/04/2021 1:15 PM Medical Record Number: [id number] Patient Account Number: [id number] Date of Birth/Sex: Treating RN: [birth year] (50 y.o. [name] Primary Care Provider: Durene [name] Other Clinician: Referring Provider: Treating Provider/Extender: [name], [name] in Treatment: 25 Constitutional Well-nourished and well-hydrated in no acute distress. Respiratory normal breathing without difficulty. Psychiatric this patient is able to make decisions and demonstrates good insight into disease process. Alert and Oriented x 3. pleasant and  cooperative. Notes Upon inspection patient's wound bed showed signs of good granulation and epithelization at this point. Fortunately I do not see any signs of active infection which is great news and overall I think that  this is very well controlled with the [place] though I think he probably needs a repeat of this for a couple more [name] to ensure that this is completely gone as bad as the infection has been in the bone I do not want this to continue to be an issue. I would also like to try to see about getting the patient approved for an additional 20 HBO treatments. My hope is that this will help to clear up the infection once in [place] and get this wound completely healed. Electronic Signature(s) Signed: 03/04/2021 2:01:37 PM By: Joseph Keeler PA-C Entered By: [name] on 03/04/2021 14:01:36 -------------------------------------------------------------------------------- Physician Orders Details Patient Name: Date of Service: [name], [place] 03/04/2021 1:15 PM Medical Record Number: [id number] Patient Account Number: [id number] Date of Birth/Sex: Treating RN: Joseph 18, [name] (50 y.o. [name] Primary Care Provider: Durene [name] Other Clinician: Referring Provider: Treating Provider/Extender: [name], [name] in Treatment: 25 Verbal / Phone Orders: No Diagnosis Coding ICD-10 Coding Code Description [id number] Other chronic osteomyelitis, right ankle and foot E11.621 Type 2 diabetes mellitus with foot ulcer L97.514 Non-pressure chronic ulcer of other part of right foot with necrosis of bone T81.31XS Disruption of external operation (surgical) wound, not elsewhere classified, sequela F17.218 Nicotine dependence, cigarettes, with other nicotine-induced disorders L97.522 Non-pressure chronic ulcer of other part of left foot with fat layer exposed L84 Corns and callosities Follow-up Appointments ppointment in 1 week. Margarita Grizzle Return A Come to office and  pick up oral antibiotics from office. Hyberbaric T will call you to resume hyberbarics once approved by insurance. ech Nurse Visit: - Friday Bathing/ Shower/ Hygiene Joseph shower with protection but do not get wound dressing(s) wet. Edema Control - Lymphedema / SCD / Other Elevate legs to the level of the heart or above for 30 minutes daily and/or when sitting, a frequency of: Avoid standing for long periods of time. Moisturize legs daily. - to foot with dressing changes Off-Loading Open toe surgical shoe to: - Wear at all times except driving. on right foot Other: - Joseph wear shoe with memory foam insert to left foot, cut hole where callous is and insert in shoe to help offload Additional Orders / Instructions Stop/Decrease Smoking Follow Nutritious Diet Non Wound Condition Other Non Wound Condition Orders/Instructions: - cushion bottom of left foot with orthopedic felt or foam Hyperbaric Oxygen Therapy Evaluate for HBO Therapy Indication: - wagner grade 3 diabetic foot ulcer of right foot If appropriate for treatment, begin HBOT per protocol: 2.5 ATA for 90 Minutes with 2 Five (5) Minute A Breaks ir Total Number of Treatments: - additional 20 One treatments per day (delivered Monday through Friday unless otherwise specified in Special Instructions below): Finger stick Blood Glucose Pre- and Post- HBOT Treatment. Follow Hyperbaric Oxygen Glycemia Protocol A frin (Oxymetazoline HCL) 0.05% nasal spray - 1 spray in both nostrils daily as needed prior to HBO treatment for difficulty clearing ears Wound Treatment Wound #3 - Foot Wound Laterality: Right, Lateral, Proximal Cleanser: Soap and Water 3 x Per Week/30 Days Discharge Instructions: Joseph shower and wash wound with dial antibacterial soap and water prior to dressing change. Cleanser: Wound Cleanser 3 x Per Week/30 Days Discharge Instructions: Cleanse the wound with wound cleanser prior to applying a clean dressing using gauze sponges,  not tissue or cotton balls. Peri-Wound Care: Sween Lotion (Moisturizing lotion) 3 x Per Week/30 Days Discharge Instructions: Apply moisturizing lotion to foot with dressing changes Prim Dressing: Endoform 2x2  in 3 x Per Week/30 Days ary Discharge Instructions: Lightly pack into wound bed. Secondary Dressing: Woven Gauze Sponge, Non-Sterile 4x4 in 3 x Per Week/30 Days Discharge Instructions: Apply over primary dressing as directed. Secondary Dressing: ABD Pad, 5x9 3 x Per Week/30 Days Discharge Instructions: Apply over primary dressing as directed. Secured With: Coban Self-Adherent Wrap 4x5 (in/yd) (Generic) 3 x Per Week/30 Days Discharge Instructions: Secure with Coban as directed. Secured With: [employer], 4.5x3.1 (in/yd) (Generic) 3 x Per Week/30 Days Discharge Instructions: Secure with Kerlix as directed. Wound #7 - Foot Wound Laterality: Plantar, Right, Lateral Cleanser: Soap and Water 3 x Per Week/30 Days Discharge Instructions: Joseph shower and wash wound with dial antibacterial soap and water prior to dressing change. Cleanser: Wound Cleanser 3 x Per Week/30 Days Discharge Instructions: Cleanse the wound with wound cleanser prior to applying a clean dressing using gauze sponges, not tissue or cotton balls. Peri-Wound Care: Sween Lotion (Moisturizing lotion) 3 x Per Week/30 Days Discharge Instructions: Apply moisturizing lotion to foot with dressing changes Prim Dressing: Endoform 2x2 in 3 x Per Week/30 Days ary Discharge Instructions: Lightly pack into wound bed. Secondary Dressing: Woven Gauze Sponge, Non-Sterile 4x4 in 3 x Per Week/30 Days Discharge Instructions: Apply over primary dressing as directed. Secondary Dressing: ABD Pad, 5x9 3 x Per Week/30 Days Discharge Instructions: Apply over primary dressing as directed. Secured With: Coban Self-Adherent Wrap 4x5 (in/yd) (Generic) 3 x Per Week/30 Days Discharge Instructions: Secure with Coban as directed. Secured With:  [employer], 4.5x3.1 (in/yd) (Generic) 3 x Per Week/30 Days Discharge Instructions: Secure with Kerlix as directed. Patient Medications llergies: penicillin, bee venom protein (honey bee) A Notifications Medication Indication Start End 03/04/2021 Nuzyra DOSE 2 - oral 150 mg tablet - 2 tablet oral taken on an empty stomach x 15 days. don't eat for 2 hours following GLYCEMIA INTERVENTIONS PROTOCOL PRE-HBO GLYCEMIA INTERVENTIONS ACTION INTERVENTION Obtain pre-HBO capillary blood glucose (ensure 1 physician order is in chart). A. Notify HBO physician and await physician orders. 2 If result is 70 mg/dl or below: B. If the result meets the hospital definition of a critical result, follow hospital policy. A. Give patient an 8 ounce Glucerna Shake, an 8 ounce Ensure, or 8 ounces of a Glucerna/Ensure equivalent dietary supplement*. B. Wait 30 minutes. If result is 71 mg/dl to 130 mg/dl: C. Retest patients capillary blood glucose (CBG). D. If result greater than or equal to 110 mg/dl, proceed with HBO. If result less than 110 mg/dl, notify HBO physician and consider holding HBO. If result is 131 mg/dl to 249 mg/dl: A. Proceed with HBO. A. Notify HBO physician and await physician orders. B. It is recommended to hold HBO and do If result is 250 mg/dl or greater: blood/urine ketone testing. C. If the result meets the hospital definition of a critical result, follow hospital policy. POST-HBO GLYCEMIA INTERVENTIONS ACTION INTERVENTION Obtain post HBO capillary blood glucose (ensure 1 physician order is in chart). A. Notify HBO physician and await physician orders. 2 If result is 70 mg/dl or below: B. If the result meets the hospital definition of a critical result, follow hospital policy. A. Give patient an 8 ounce Glucerna Shake, an 8 ounce Ensure, or 8 ounces of a Glucerna/Ensure equivalent dietary supplement*. B. Wait 15 minutes for symptoms of If result is 71  mg/dl to 100 mg/dl: hypoglycemia (i.e. nervousness, anxiety, sweating, chills, clamminess, irritability, confusion, tachycardia or dizziness). C. If patient asymptomatic, discharge patient. If patient symptomatic, repeat capillary blood  glucose (CBG) and notify HBO physician. If result is 101 mg/dl to 249 mg/dl: A. Discharge patient. A. Notify HBO physician and await physician orders. B. It is recommended to do blood/urine ketone If result is 250 mg/dl or greater: testing. C. If the result meets the hospital definition of a critical result, follow hospital policy. *Juice or candies are NOT equivalent products. If patient refuses the Glucerna or Ensure, please consult the hospital dietitian for an appropriate substitute. Electronic Signature(s) Signed: 03/04/2021 2:05:06 PM By: Joseph Keeler PA-C Entered By: [name] on 03/04/2021 14:05:03 -------------------------------------------------------------------------------- Problem List Details Patient Name: Date of Service: [name], [place] 03/04/2021 1:15 PM Medical Record Number: [id number] Patient Account Number: [id number] Date of Birth/Sex: Treating RN: Jan [birth year], [name] (50 y.o. [name] Primary Care Provider: Minette Brine, [place] Other Clinician: Referring Provider: Treating Provider/Extender: [name], [name] in Treatment: 25 Active Problems ICD-10 Encounter Code Description Active Date MDM Diagnosis [phone] Other chronic osteomyelitis, right ankle and foot 09/05/2020 No Yes E11.621 Type 2 diabetes mellitus with foot ulcer 09/05/2020 No Yes L97.514 Non-pressure chronic ulcer of other part of right foot with necrosis of bone 09/05/2020 No Yes T81.31XS Disruption of external operation (surgical) wound, not elsewhere classified, 09/05/2020 No Yes sequela F17.218 Nicotine dependence, cigarettes, with other nicotine-induced disorders 11/26/2020 No Yes L97.522 Non-pressure chronic ulcer of other part of  left foot with fat layer exposed 10/22/2020 No Yes L84 Corns and callosities 01/28/2021 No Yes Inactive Problems Resolved Problems Electronic Signature(s) Signed: 03/04/2021 1:44:44 PM By: Joseph Keeler PA-C Entered By: [name] on 03/04/2021 13:44:44 -------------------------------------------------------------------------------- Progress Note Details Patient Name: Date of Service: Joseph RD, [name] 03/04/2021 1:15 PM Medical Record Number: [id number] Patient Account Number: [id number] Date of Birth/Sex: Treating RN: [birth year][name] (50 y.o. [name] Primary Care Provider: Durene [name] Other Clinician: Referring Provider: Treating Provider/Extender: [name], [name] in Treatment: 25 Subjective Chief Complaint Information obtained from Patient 09/05/2020; patient is here for review of the wound extensively on a right TMA amputation site. He also has a wound on the plantar aspect of the left foot History of Present Illness (HPI) ADMISSION 09/05/2020 This is a 50 year old man who has type 2 diabetes. Essentially the problem began admitted to [facility] in December with infection gas gangrene. Cultures ultimately grew staph lugdunensis. 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 Shortly thereafter he developed a nonhealing wound with wound dehiscence noted on 05/09/2020. He had a wound VAC I think for a period of time after the surgery but it did not help. He has undergone an IandD and skin graft on the right foot on 05/16/2020. He underwent a further IandD on 06/04/2020 not debrided to bone. 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 Culture of the metatarsal grew Enterobacter cloacae I. He has been followed by [name] of infectious disease. Currently on Levaquin and doxycycline I think at about the 4-week of 6 mark. At the last visit  with [name] he was supposed to have use Santyl although I do not think he has it and he has been using I think a Betadine wet-to-dry. He is referred here by [name] for our review who saw him yesterday. I cannot see that he has had imaging studies of the right foot. No MRI Past medical history includes type 2 diabetes, in 2021 and ulcer of  the left foot that ultimately healed with a wound VAC, stage III chronic kidney disease, first toe amputations bilaterally, hypertension, still a 1/2 pack/day smoker. The patient had ABIs on 08/09/2018 which was 1.00 on the right and 0.96 on the left both areas had triphasic waveforms. Not felt to have an arterial issue. 09/12/2020; 1 week follow-up. He has been doing the dressings along with home health. This is a deep punched out area in the incision of his transmit site. We use silver alginate last week to help with drainage and antibacterial properties. He tolerated this well 6/24; the patient was actually at his podiatrist this morning who debrided the wound. We have been using silver alginate. They are going to follow him in 6 [name]. 6/29; patient is using silver alginate. Apparently saw [name] this week although I have not checked his note I will try to do so. He is following up in 6 [name]. This was the original transmetatarsal amputation when I first saw this 3 mid part of his amputation site was deep down to bone however this seems to have progressively close down which is gratifying to see. He still has a fairly callus uneven surface however spreading this apart its difficult to identify anything that does not look epithelialized. When I first saw this I thought he would require an extensive debridement perhaps back in the OR by [name] however all of this appears to be looking better and at this point I would continue with the silver alginate see if this holds together over the next several [name]. He does not have an arterial issue 7/13; 2-week  follow-up. Unfortunately the area did not hold together there was drainage on his dressing. Still copious amounts of callus. It was difficult to see where this actually was open therefore I elected to go ahead with a vigorous debridement. Still using silver alginate 7/20 I brought him back after extensive callus debridement last week. The only open area was on the medial part of the foot extensive debridement of thick surrounding callus and I was able to do identify the remaining tunneling area. 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 This is the area that we Joseph have been following. This is smaller this week but still surrounded with thick callus He came in today with a large area of callus on the left midfoot. This I had noticed previously. HOWEVER our intake nurse clearly incorrectly documented this as opening. The callus and split there was an odor so all of this had to be removed. 10/29/2020 unfortunately upon evaluation today this patient appears to have significant mount of callus noted at this point. There does not appear to be any signs of active infection systemically which is great although locally he is having some discomfort around the lateral portion of his foot on the right. The left foot is no having a lot of callus I am not certain even has an open wound if there is anything is extremely tiny. 11/05/2020 upon evaluation today patient's wounds actually appear to be doing a bit better compared to last week since we have uncovered some of these areas I think there are some definite improvements. With that being said there is still quite a bit of tunneling and undermining at many locations that again has me concerned I still think that the MRI is the best thing to do he has that scheduled for Saturday. 11/26/2020 upon evaluation  today patient appears to be doing decently well in regard to his foot in general. He did have an MRI  I did review this and he had multiple findings on MRI consistent with osteomyelitis across the board in regard pretty much to his remaining metatarsal regions. Nonetheless there was also some evidence of some involvement of the navicular bone as well. Either way I think that this patient actually is in desperate need of get him in for hyperbaric oxygen therapy we discussed that today that will be detailed in the plan. Nonetheless he is also still on the doxycycline which I think is appropriate for the time being as well I did go ahead and place him on that following the results of the MRI based on what we were seeing. I gave him that prescription for 14 days for the time being and we will go from there. 12/03/2020 upon evaluation today patient appears to be doing decently well in regard to his wounds in general in regard to the right foot. With that being said he does have a callus on the left foot in the end there was actually an area open area here that will need to be addressed. This is small and I think we take care of it now hopefully will heal quite readily. Fortunately I do not see any signs of infection worsening I am getting need to refill the antibiotic for him today. He has been taking doxycycline which seems to be doing well. We will also get a go ahead and get him approved for HBO therapy. 12/31/2020 upon evaluation today patient appears to be doing really roughly the same as compared to last time I saw him. Fortunately there is no evidence of active infection which is great news systemically he has started hyperbarics he seems to be doing decently well he said a couple times where anxiety kind of got to him but overall I think he is doing awesome. He is no longer on the antibiotic therapy. With that being said I do believe that he is going to need likely some additional antibiotics based on what I am seeing today we can actually obtain a bone culture from the plantar wound that was hiding  underneath the callus. He was having pain in this area which is what Joseph be try to clear away the callus to look sure enough there was an actual opening that went deeper into the wound bed region here. 01/21/2021 upon evaluation today patient appears to be doing about the same in regard to his foot in general. He does have some depth to some of the wound areas I really think he would benefit from is trying to see about getting him set up for a gauze VAC. I think that the gauze VAC could be helpful for him and will work much more effectively with what we are seeing as compared to traditional foam wound VAC. The patient is in agreement with giving this a trial. Nonetheless unfortunately think that he overall seems to be doing really well and I think we are headed in the right direction as far as the overall healing is concerned. 01/28/2021 upon evaluation today patient appears to be doing well with regard to his wound all things considered he does have a lot of callus buildup especially on the plantar aspect of the left foot. There is no wound here but a significant amount of callus. That is getting need to be addressed today. Subsequently he also has a lot of callus  on the right foot as well as some of the area around the wounds actually being significantly callused. Again I need to clear this away today. 02/04/2021 upon evaluation today patient actually seems to be doing quite well with regard to his foot ulcer. I do believe that the hyperbaric oxygen therapy has been beneficial for him thus far also think that he is doing well with the wound VAC. In combination I think he has an excellent plan at this time as far as treatment is concerned. I do not see any signs of evidence of infection which is great news as well. 02/11/2021 upon evaluation today patient appears to be doing okay in regard to his foot ulcer though still has deep wounds that do not seem to be doing nearly as good as what I would like  to see. I think that the wound VAC is helping but I do believe there still bone in the base of the wound I think we probably need to see about put him back on an antibiotic, do a PCR culture today to see what we find in order to determine whether or not we need to have him on antibiotics which I think we probably do is just a matter which 1. 02/18/2021 upon evaluation today patient appears to be doing okay with regard to his foot ulcer. He has been using the wound VAC although there is a lot of drainage and to be honest I am not certain this is doing as well as we wanted to right now especially considering the fact the soles of gotten so small which in some ways is good but in other ways is still problematic for Sorbact standpoint. I am not even certain we will be able to reinitiate the VAC even next week. Nonetheless for now he is on the [place] which is definitely something that we need to get going I am also can see about a referral back to infectious disease to see what they have to say in regard to what is going on currently as well. He voiced understanding and is in agreement with that plan. I did explain this is a very touchy go situation which hinges on [place] getting this healed and the infection improved if not he is looking at amputation. Obviously this is a limb salvage Plan at this point. 02/25/2021 upon evaluation today patient's wound bed actually showed signs of being much drier and much less macerated compared to last week. We had a wound VAC on hold for this week. With that being said based on what I am seeing I think we probably just want to cancel the wound VAC altogether. I do not think it was doing enough for him to warrant the maceration and risks that are associated. He is on the [place] at this point. He also has an infection disease appointment next week. We will see what they have to say at this juncture. Nonetheless currently he is wrapping up his first round of hyperbaric  oxygen therapy we could consider repeat although the biggest issue right now is simply that he actually is having surgery on his limbs due to cataract issues that is tomorrow. Following that surgery I am not certain he really needs to be in the chamber for some period of time we need to see what his surgeon says. 03/04/2021 upon evaluation today patient's foot actually does appear to be doing better which is great news. I do not see any signs of infection he has been tolerating  the [place] though he is done with that currently I think we probably need to reopen this for a little bit longer to ensure that he is completely treated. He is in agreement with this plan. Subsequently I will send that into the pharmacy for him today. Objective Constitutional Well-nourished and well-hydrated in no acute distress. Vitals Time Taken: 1:25 PM, Height: 65 in, Weight: 196 lbs, BMI: 32.6, Temperature: 98.5 F, Pulse: 91 bpm, Respiratory Rate: 16 breaths/min, Blood Pressure: 136/64 mmHg, Capillary Blood Glucose: 145 mg/dl. Respiratory normal breathing without difficulty. Psychiatric this patient is able to make decisions and demonstrates good insight into disease process. Alert and Oriented x 3. pleasant and cooperative. General Notes: Upon inspection patient's wound bed showed signs of good granulation and epithelization at this point. Fortunately I do not see any signs of active infection which is great news and overall I think that this is very well controlled with the Elesa Hacker though I think he probably needs a repeat of this for a couple more [name] to ensure that this is completely gone as bad as the infection has been in the bone I do not want this to continue to be an issue. I would also like to try to see about getting the patient approved for an additional 20 HBO treatments. My hope is that this will help to clear up the infection once in [place] and get this wound completely healed. Integumentary (Hair,  Skin) Wound #3 status is Open. Original cause of wound was Gradually Appeared. The date acquired was: 10/29/2020. The wound has been in treatment 18 [name]. The wound is located on the Right,Proximal,Lateral Foot. The wound measures 1.3cm length x 0.2cm width x 0.5cm depth; 0.204cm^2 area and 0.102cm^3 volume. There is Fat Layer (Subcutaneous Tissue) exposed. There is no undermining noted, however, there is tunneling at 7:00 with a maximum distance of 0.7cm. There is a small amount of serosanguineous drainage noted. The wound margin is thickened. There is no granulation within the wound bed. There is no necrotic tissue within the wound bed. General Notes: callous noted to periwound. Wound #7 status is Open. Original cause of wound was Gradually Appeared. The date acquired was: 12/31/2020. The wound has been in treatment 9 [name]. The wound is located on the [place]. The wound measures 0.4cm length x 0.2cm width x 2.2cm depth; 0.063cm^2 area and 0.138cm^3 volume. There is bone and Fat Layer (Subcutaneous Tissue) exposed. There is no tunneling or undermining noted. There is a medium amount of serosanguineous drainage noted. The wound margin is distinct with the outline attached to the wound base. There is large (67-100%) red granulation within the wound bed. There is no necrotic tissue within the wound bed. General Notes: callous periwound. Assessment Active Problems ICD-10 Other chronic osteomyelitis, right ankle and foot Type 2 diabetes mellitus with foot ulcer Non-pressure chronic ulcer of other part of right foot with necrosis of bone Disruption of external operation (surgical) wound, not elsewhere classified, sequela Nicotine dependence, cigarettes, with other nicotine-induced disorders Non-pressure chronic ulcer of other part of left foot with fat layer exposed Corns and callosities Plan Follow-up Appointments: Return Appointment in 1 week. Margarita Grizzle Come to office and pick up  oral antibiotics from office. Hyberbaric T will call you to resume hyberbarics once ech approved by insurance. Nurse Visit: - Friday Bathing/ Shower/ Hygiene: Joseph shower with protection but do not get wound dressing(s) wet. Edema Control - Lymphedema / SCD / Other: Elevate legs to the level of the heart or  above for 30 minutes daily and/or when sitting, a frequency of: Avoid standing for long periods of time. Moisturize legs daily. - to foot with dressing changes Off-Loading: Open toe surgical shoe to: - Wear at all times except driving. on right foot Other: - Joseph wear shoe with memory foam insert to left foot, cut hole where callous is and insert in shoe to help offload Additional Orders / Instructions: Stop/Decrease Smoking Follow Nutritious Diet Non Wound Condition: Other Non Wound Condition Orders/Instructions: - cushion bottom of left foot with orthopedic felt or foam Hyperbaric Oxygen Therapy: Evaluate for HBO Therapy Indication: - wagner grade 3 diabetic foot ulcer of right foot If appropriate for treatment, begin HBOT per protocol: 2.5 ATA for 90 Minutes with 2 Five (5) Minute Air Breaks T Number of Treatments: - additional 20 otal One treatments per day (delivered Monday through Friday unless otherwise specified in Special Instructions below): Finger stick Blood Glucose Pre- and Post- HBOT Treatment. Follow Hyperbaric Oxygen Glycemia Protocol Afrin (Oxymetazoline HCL) 0.05% nasal spray - 1 spray in both nostrils daily as needed prior to HBO treatment for difficulty clearing ears The following medication(s) was prescribed: Nuzyra oral 150 mg tablet 2 2 tablet oral taken on an empty stomach x 15 days. don't eat for 2 hours following starting 03/04/2021 WOUND #3: - Foot Wound Laterality: Right, Lateral, Proximal Cleanser: Soap and Water 3 x Per Week/30 Days Discharge Instructions: Joseph shower and wash wound with dial antibacterial soap and water prior to dressing  change. Cleanser: Wound Cleanser 3 x Per Week/30 Days Discharge Instructions: Cleanse the wound with wound cleanser prior to applying a clean dressing using gauze sponges, not tissue or cotton balls. Peri-Wound Care: Sween Lotion (Moisturizing lotion) 3 x Per Week/30 Days Discharge Instructions: Apply moisturizing lotion to foot with dressing changes Prim Dressing: Endoform 2x2 in 3 x Per Week/30 Days ary Discharge Instructions: Lightly pack into wound bed. Secondary Dressing: Woven Gauze Sponge, Non-Sterile 4x4 in 3 x Per Week/30 Days Discharge Instructions: Apply over primary dressing as directed. Secondary Dressing: ABD Pad, 5x9 3 x Per Week/30 Days Discharge Instructions: Apply over primary dressing as directed. Secured With: Coban Self-Adherent Wrap 4x5 (in/yd) (Generic) 3 x Per Week/30 Days Discharge Instructions: Secure with Coban as directed. Secured With: [employer], 4.5x3.1 (in/yd) (Generic) 3 x Per Week/30 Days Discharge Instructions: Secure with Kerlix as directed. WOUND #7: - Foot Wound Laterality: Plantar, Right, Lateral Cleanser: Soap and Water 3 x Per Week/30 Days Discharge Instructions: Joseph shower and wash wound with dial antibacterial soap and water prior to dressing change. Cleanser: Wound Cleanser 3 x Per Week/30 Days Discharge Instructions: Cleanse the wound with wound cleanser prior to applying a clean dressing using gauze sponges, not tissue or cotton balls. Peri-Wound Care: Sween Lotion (Moisturizing lotion) 3 x Per Week/30 Days Discharge Instructions: Apply moisturizing lotion to foot with dressing changes Prim Dressing: Endoform 2x2 in 3 x Per Week/30 Days ary Discharge Instructions: Lightly pack into wound bed. Secondary Dressing: Woven Gauze Sponge, Non-Sterile 4x4 in 3 x Per Week/30 Days Discharge Instructions: Apply over primary dressing as directed. Secondary Dressing: ABD Pad, 5x9 3 x Per Week/30 Days Discharge Instructions: Apply over primary  dressing as directed. Secured With: Coban Self-Adherent Wrap 4x5 (in/yd) (Generic) 3 x Per Week/30 Days Discharge Instructions: Secure with Coban as directed. Secured With: [employer], 4.5x3.1 (in/yd) (Generic) 3 x Per Week/30 Days Discharge Instructions: Secure with Kerlix as directed. 1. I would recommend that we go ahead and  continue with the [place]. The patient is in agreement with the plan I will send in an additional 2 [name] worth of medication today. 2. I am also can recommend that we go ahead and see about reopening the HBO therapy for an additional 20 visits if we can get this approved we will get a check with his insurance and see what can be done. 3. I am going to suggest as well that we have the patient continue using the offloading shoe which I think is doing a good job for him. We will see patient back for reevaluation in 1 week here in the clinic. If anything worsens or changes patient will contact our office for additional recommendations. Electronic Signature(s) Signed: 03/04/2021 2:07:13 PM By: Joseph Keeler PA-C Entered By: [name] on 03/04/2021 14:07:12 -------------------------------------------------------------------------------- SuperBill Details Patient Name: Date of Service: Joseph RD, [name] 03/04/2021 Medical Record Number: [id number] Patient Account Number: [id number] Date of Birth/Sex: Treating RN: [birth year][name] (50 y.o. [name] Primary Care Provider: Durene [name] Other Clinician: Referring Provider: Treating Provider/Extender: [name], [name] in Treatment: 25 Diagnosis Coding ICD-10 Codes Code Description [phone] Other chronic osteomyelitis, right ankle and foot E11.621 Type 2 diabetes mellitus with foot ulcer L97.514 Non-pressure chronic ulcer of other part of right foot with necrosis of bone T81.31XS Disruption of external operation (surgical) wound, not elsewhere classified, sequela F17.218 Nicotine  dependence, cigarettes, with other nicotine-induced disorders L97.522 Non-pressure chronic ulcer of other part of left foot with fat layer exposed L84 Corns and callosities Facility Procedures CPT4 Code: [id number] Description: 99214 - WOUND CARE VISIT-LEV 4 EST PT Modifier: Quantity: 1 Physician Procedures : CPT4 Code Description Modifier [id number] - WC PHYS LEVEL 4 - EST PT ICD-10 Diagnosis Description M86.671 Other chronic osteomyelitis, right ankle and foot E11.621 Type 2 diabetes mellitus with foot ulcer L97.514 Non-pressure chronic ulcer of other  part of right foot with necrosis of bone T81.31XS Disruption of external operation (surgical) wound, not elsewhere classified, sequela Quantity: 1 Electronic Signature(s) Signed: 03/04/2021 4:36:51 PM By: [name] RN, BSN Signed: 03/04/2021 4:51:36 PM By: Joseph Keeler PA-C Previous Signature: 03/04/2021 2:07:34 PM Version By: Joseph Keeler PA-C Entered By: [name] on 03/04/2021 14:09:30

## 2021-03-04 NOTE — Progress Notes (Signed)
DAIQUAN, RESNIK ([id number]) Visit Report for 03/04/2021 Arrival Information Details Patient Name: Date of Service: [place] [address], [place] 03/04/2021 1:15 PM Medical Record Number: [id number] Patient Account Number: [id number] Date of Birth/Sex: Treating RN: [birth year] (50 y.o. [name], [name] Primary Care Akira Perusse: [name] Other Clinician: Referring Harlym Gehling: Treating Khadijah Mastrianni/Extender: [name], [name] in Treatment: 25 Visit Information History Since Last Visit All ordered tests and consults were completed: Yes Patient Arrived: Cane Added or deleted any medications: No Arrival Time: 13:27 Any new allergies or adverse reactions: No Accompanied By: self Had a fall or experienced change in No Transfer Assistance: None activities of daily living that may affect Patient Identification Verified: Yes risk of falls: Secondary Verification Process Completed: Yes Signs or symptoms of abuse/neglect since last visito No Patient Requires Transmission-Based Precautions: No Hospitalized since last visit: No Patient Has Alerts: No Implantable device outside of the clinic excluding No cellular tissue based products placed in the center since last visit: Has Dressing in Place as Prescribed: Yes Pain Present Now: No Notes per patient seen ID yesterday and has completed oral nuyzera. Electronic Signature(s) Signed: 03/04/2021 4:36:51 PM By: [name] RN, BSN Entered By: [name] on 03/04/2021 13:28:31 -------------------------------------------------------------------------------- Clinic Level of Care Assessment Details Patient Name: Date of Service: [facility] RD, [place] 03/04/2021 1:15 PM Medical Record Number: [id number] Patient Account Number: [id number] Date of Birth/Sex: Treating RN: [birth year] (50 y.o. [name] Primary Care Manpreet Kemmer: [name] Other Clinician: Referring Cyndy Braver: Treating Sarah Zerby/Extender: [name],  [name] in Treatment: 25 Clinic Level of Care Assessment Items TOOL 4 Quantity Score X- 1 0 Use when only an EandM is performed on FOLLOW-UP visit ASSESSMENTS - Nursing Assessment / Reassessment X- 1 10 Reassessment of Co-morbidities (includes updates in patient status) X- 1 5 Reassessment of Adherence to Treatment Plan ASSESSMENTS - Wound and Skin A ssessment / Reassessment _0  - 0 Simple Wound Assessment / Reassessment - one wound X- 2 5 Complex Wound Assessment / Reassessment - multiple wounds X- 1 10 Dermatologic / Skin Assessment (not related to wound area) ASSESSMENTS - Focused Assessment X- 1 5 Circumferential Edema Measurements - multi extremities X- 1 10 Nutritional Assessment / Counseling / Intervention _1  - 0 Lower Extremity Assessment (monofilament, tuning fork, pulses) _2  - 0 Peripheral Arterial Disease Assessment (using hand held doppler) ASSESSMENTS - Ostomy and/or Continence Assessment and Care _3  - 0 Incontinence Assessment and Management _4  - 0 Ostomy Care Assessment and Management (repouching, etc.) PROCESS - Coordination of Care _5  - 0 Simple Patient / Family Education for ongoing care X- 1 20 Complex (extensive) Patient / Family Education for ongoing care X- 1 10 Staff obtains Programmer, systems, Records, T Results / Process Orders est _6  - 0 Staff telephones HHA, Nursing Homes / Clarify orders / etc _7  - 0 Routine Transfer to another Facility (non-emergent condition) _8  - 0 Routine Hospital Admission (non-emergent condition) _9  - 0 New Admissions / Biomedical engineer / Ordering NPWT Apligraf, etc. , _10  - 0 Emergency Hospital Admission (emergent condition) _11  - 0 Simple Discharge Coordination X- 1 15 Complex (extensive) Discharge Coordination PROCESS - Special Needs _12  - 0 Pediatric / Minor Patient Management _13  - 0 Isolation Patient Management _14  - 0 Hearing / Language / Visual special needs _15  - 0 Assessment of Community assistance  (transportation, D/C planning, etc.) _16  - 0 Additional assistance / Altered mentation _17  - 0 Support Surface(s) Assessment (bed, cushion, seat, etc.) INTERVENTIONS - Wound Cleansing /  Measurement _0  - 0 Simple Wound Cleansing - one wound X- 2 5 Complex Wound Cleansing - multiple wounds X- 1 5 Wound Imaging (photographs - any number of wounds) _1  - 0 Wound Tracing (instead of photographs) _2  - 0 Simple Wound Measurement - one wound X- 2 5 Complex Wound Measurement - multiple wounds INTERVENTIONS - Wound Dressings _3  - 0 Small Wound Dressing one or multiple wounds X- 1 15 Medium Wound Dressing one or multiple wounds _4  - 0 Large Wound Dressing one or multiple wounds <WFUXNATFTDDUKGUR>_4<\/YHCWCBJSEGBTDVVO>_1  - 0 Application of Medications - topical <YWVPXTGGYIRSWNIO>_2<\/VOJJKKXFGHWEXHBZ>_1  - 0 Application of Medications - injection INTERVENTIONS - Miscellaneous _7  - 0 External ear exam _8  - 0 Specimen Collection (cultures, biopsies, blood, body fluids, etc.) _9  - 0 Specimen(s) / Culture(s) sent or taken to Lab for analysis _10  - 0 Patient Transfer (multiple staff / Civil Service fast streamer / Similar devices) _11  - 0 Simple Staple / Suture removal (25 or less) _12  - 0 Complex Staple / Suture removal (26 or more) _13  - 0 Hypo / Hyperglycemic Management (close monitor of Blood Glucose) _14  - 0 Ankle / Brachial Index (ABI) - do not check if billed separately X- 1 5 Vital Signs Has the patient been seen at the hospital within the last three years: Yes Total Score: 140 Level Of Care: New/Established - Level 4 Electronic Signature(s) Signed: 03/04/2021 4:36:51 PM By: [name] RN, BSN Entered By: [name] on 03/04/2021 14:09:12 -------------------------------------------------------------------------------- Encounter Discharge Information Details Patient Name: Date of Service: [name], [name] 03/04/2021 1:15 PM Medical Record Number: [id number] Patient Account Number: [id number] Date of Birth/Sex: Treating RN: [birth year] (50 y.o. [name] Primary Care Latressa Harries: [name] Other Clinician: Referring Wymon Swaney: Treating Dekendrick Uzelac/Extender: [name], [name] in Treatment: 25 Encounter Discharge Information Items Discharge Condition: Stable Ambulatory Status: Cane Discharge Destination: Home Transportation: Private Auto Accompanied By: self Schedule Follow-up Appointment: Yes Clinical Summary of Care: Electronic Signature(s) Signed: 03/04/2021 4:36:51 PM By: [name] RN, BSN Entered By: [name] on 03/04/2021 14:11:28 -------------------------------------------------------------------------------- Lower Extremity Assessment Details Patient Name: Date of Service: [name], [place] 03/04/2021 1:15 PM Medical Record Number: [id number] Patient Account Number: [id number] Date of Birth/Sex: Treating RN: 02-20-1971 (50 y.o. [name] Primary Care Athina Fahey: [name] Other Clinician: Referring Anapaula Severt: Treating Randon Somera/Extender: [name], [name] in Treatment: 25 Edema Assessment Assessed: [Left: No] [Right: Yes] Edema: [Left: Yes] [Right: Yes] Calf Left: Right: Point of Measurement: From Medial Instep 34 cm Ankle Left: Right: Point of Measurement: From Medial Instep 24 cm Electronic Signature(s) Signed: 03/04/2021 4:36:51 PM By: [name] RN, BSN Entered By: [name] on 03/04/2021 13:29:41 -------------------------------------------------------------------------------- [place] Details Patient Name: Date of Service: [name], [place] 03/04/2021 1:15 PM Medical Record Number: [id number] Patient Account Number: [id number] Date of Birth/Sex: Treating RN: December 16, 1970 (50 y.o. [name] Primary Care Mattelyn Imhoff: [name] Other Clinician: Referring Gertrue Willette: Treating Suri Tafolla/Extender: [name], [name] in Treatment: 25 Active Inactive Osteomyelitis Nursing Diagnoses: Infection:  osteomyelitis Knowledge deficit related to disease process and management Goals: Patient/caregiver will verbalize understanding of disease process and disease management Date Initiated: 11/26/2020 Target Resolution Date: 03/25/2021 Goal Status: Active Patient's osteomyelitis will resolve Date Initiated: 11/26/2020 Target Resolution Date: 03/25/2021 Goal Status: Active Interventions: Assess for signs and symptoms of osteomyelitis resolution every visit Provide education on osteomyelitis Screen for HBO Treatment Activities: Systemic antibiotics : 11/26/2020 Notes: 02/25/21 continuiing treatment, ID appointment next week  Wound/Skin Impairment Nursing Diagnoses: Impaired tissue integrity Goals: Patient/caregiver will verbalize understanding of skin care regimen Date Initiated: 09/05/2020 Target Resolution Date: 03/25/2021 Goal Status: Active Ulcer/skin breakdown will have a volume reduction of 30% by week 4 Date Initiated: 09/05/2020 Date Inactivated: 10/08/2020 Target Resolution Date: 10/03/2020 Goal Status: Met Interventions: Assess patient/caregiver ability to obtain necessary supplies Assess patient/caregiver ability to perform ulcer/skin care regimen upon admission and as needed Assess ulceration(s) every visit Provide education on ulcer and skin care Treatment Activities: Patient referred to home care : 09/05/2020 Skin care regimen initiated : 09/05/2020 Topical wound management initiated : 09/05/2020 Notes: Electronic Signature(s) Signed: 03/04/2021 4:36:51 PM By: [name] RN, BSN Entered By: [name] on 03/04/2021 13:53:03 -------------------------------------------------------------------------------- Pain Assessment Details Patient Name: Date of Service: [name], [place] 03/04/2021 1:15 PM Medical Record Number: [id number] Patient Account Number: [id number] Date of Birth/Sex: Treating RN: 02-02-1971 (50 y.o. [name] Primary Care Gissel Keilman: [name] Other Clinician: Referring Serenity Batley: Treating Frans Valente/Extender: [name], [name] in Treatment: 25 Active Problems Location of Pain Severity and Description of Pain Patient Has Paino No Site Locations Rate the pain. Current Pain Level: 0 Pain Management and Medication Current Pain Management: Medication: No Cold Application: No Rest: No Massage: No Activity: No T.E.N.S.: No Heat Application: No Leg drop or elevation: No Is the Current Pain Management Adequate: Adequate How does your wound impact your activities of daily livingo Sleep: No Bathing: No Appetite: No Relationship With Others: No Bladder Continence: No Emotions: No Bowel Continence: No Work: No Toileting: No Drive: No Dressing: No Hobbies: No Engineer, maintenance) Signed: 03/04/2021 4:36:51 PM By: [name] RN, BSN Entered By: [name] on 03/04/2021 13:29:16 -------------------------------------------------------------------------------- Patient/Caregiver Education Details Patient Name: Date of Service: [name], [place] [facility] Lenna Sciara 12/7/2022andnbsp1:15 PM Medical Record Number: [id number] Patient Account Number: [id number] Date of Birth/Gender: Treating RN: [birth year] (50 y.o. [name] Primary Care Physician: [name] Other Clinician: Referring Physician: Treating Physician/Extender: [name], [name] in Treatment: 25 Education Assessment Education Provided To: Patient Education Topics Provided Infection: Handouts: CDC antimicrobial patient education_English, Infection Prevention and Management Methods: Explain/Verbal Responses: Reinforcements needed Electronic Signature(s) Signed: 03/04/2021 4:36:51 PM By: [name] RN, BSN Entered By: [name] on 03/04/2021 13:53:24 -------------------------------------------------------------------------------- Wound Assessment Details Patient Name: Date of Service: [name], [place]  03/04/2021 1:15 PM Medical Record Number: [id number] Patient Account Number: [id number] Date of Birth/Sex: Treating RN: Feb 15, 1971 (50 y.o. [name] Primary Care Januel Doolan: [name] Other Clinician: Referring Wilson Sample: Treating Tiwana Chavis/Extender: [name], [name] in Treatment: 25 Wound Status Wound Number: 3 Primary Diabetic Wound/Ulcer of the Lower Extremity Etiology: Wound Location: Right, Proximal, Lateral Foot Wound Open Wounding Event: Gradually Appeared Status: Date Acquired: 10/29/2020 Comorbid Anemia, Coronary Artery Disease, Hypertension, Type II Weeks Of Treatment: 18 History: Diabetes, Osteomyelitis, Neuropathy Clustered Wound: No Photos Wound Measurements Length: (cm) 1.3 Width: (cm) 0.2 Depth: (cm) 0.5 Area: (cm) 0.204 Volume: (cm) 0.102 % Reduction in Area: -61.9% % Reduction in Volume: -15.9% Epithelialization: None Tunneling: Yes Position (o'clock): 7 Maximum Distance: (cm) 0.7 Undermining: No Wound Description Classification: Grade 3 Wound Margin: Thickened Exudate Amount: Small Exudate Type: Serosanguineous Exudate Color: red, brown Foul Odor After Cleansing: No Slough/Fibrino No Wound Bed Granulation Amount: None Present (0%) Exposed Structure Necrotic Amount: None Present (0%) Fascia Exposed: No Fat Layer (Subcutaneous Tissue) Exposed: Yes Tendon Exposed: No Muscle Exposed: No Joint Exposed: No Bone Exposed: No Assessment Notes callous  noted to periwound. Treatment Notes Wound #3 (Foot) Wound Laterality: Right, Lateral, Proximal Cleanser Soap and Water Discharge Instruction: May shower and wash wound with dial antibacterial soap and water prior to dressing change. Wound Cleanser Discharge Instruction: Cleanse the wound with wound cleanser prior to applying a clean dressing using gauze sponges, not tissue or cotton balls. Peri-Wound Care Sween Lotion (Moisturizing lotion) Discharge Instruction: Apply  moisturizing lotion to foot with dressing changes Topical Primary Dressing Endoform 2x2 in Discharge Instruction: Lightly pack into wound bed. Secondary Dressing Woven Gauze Sponge, Non-Sterile 4x4 in Discharge Instruction: Apply over primary dressing as directed. ABD Pad, 5x9 Discharge Instruction: Apply over primary dressing as directed. Secured With [employer] 4x5 (in/yd) Discharge Instruction: Secure with Coban as directed. Kerlix Roll Sterile, 4.5x3.1 (in/yd) Discharge Instruction: Secure with Kerlix as directed. Compression Wrap Compression Stockings Add-Ons Electronic Signature(s) Signed: 03/04/2021 4:36:51 PM By: [name] RN, BSN Entered By: [name] on 03/04/2021 13:56:01 -------------------------------------------------------------------------------- Wound Assessment Details Patient Name: Date of Service: [name], [name] 03/04/2021 1:15 PM Medical Record Number: [id number] Patient Account Number: [id number] Date of Birth/Sex: Treating RN: 1971-02-01 (50 y.o. [name] Primary Care Karri Kallenbach: [name] Other Clinician: Referring Syre Knerr: Treating Kary Colaizzi/Extender: [name], [name] in Treatment: 25 Wound Status Wound Number: 7 Primary Diabetic Wound/Ulcer of the Lower Extremity Etiology: Wound Location: Right, Lateral, Plantar Foot Wound Open Wounding Event: Gradually Appeared Status: Date Acquired: 12/31/2020 Comorbid Anemia, Coronary Artery Disease, Hypertension, Type II Weeks Of Treatment: 9 History: Diabetes, Osteomyelitis, Neuropathy Clustered Wound: No Photos Wound Measurements Length: (cm) 0.4 Width: (cm) 0.2 Depth: (cm) 2.2 Area: (cm) 0.063 Volume: (cm) 0.138 % Reduction in Area: 67.9% % Reduction in Volume: 29.6% Epithelialization: None Tunneling: No Undermining: No Wound Description Classification: Grade 3 Wound Margin: Distinct, outline attached Exudate Amount: Medium Exudate  Type: Serosanguineous Exudate Color: red, brown Foul Odor After Cleansing: No Slough/Fibrino No Wound Bed Granulation Amount: Large (67-100%) Exposed Structure Granulation Quality: Red Fascia Exposed: No Necrotic Amount: None Present (0%) Fat Layer (Subcutaneous Tissue) Exposed: Yes Tendon Exposed: No Muscle Exposed: No Joint Exposed: No Bone Exposed: Yes Assessment Notes callous periwound. Treatment Notes Wound #7 (Foot) Wound Laterality: Plantar, Right, Lateral Cleanser Soap and Water Discharge Instruction: May shower and wash wound with dial antibacterial soap and water prior to dressing change. Wound Cleanser Discharge Instruction: Cleanse the wound with wound cleanser prior to applying a clean dressing using gauze sponges, not tissue or cotton balls. Peri-Wound Care Sween Lotion (Moisturizing lotion) Discharge Instruction: Apply moisturizing lotion to foot with dressing changes Topical Primary Dressing Endoform 2x2 in Discharge Instruction: Lightly pack into wound bed. Secondary Dressing Woven Gauze Sponge, Non-Sterile 4x4 in Discharge Instruction: Apply over primary dressing as directed. ABD Pad, 5x9 Discharge Instruction: Apply over primary dressing as directed. Secured With [employer] 4x5 (in/yd) Discharge Instruction: Secure with Coban as directed. Kerlix Roll Sterile, 4.5x3.1 (in/yd) Discharge Instruction: Secure with Kerlix as directed. Compression Wrap Compression Stockings Add-Ons Electronic Signature(s) Signed: 03/04/2021 4:36:51 PM By: [name] RN, BSN Entered By: [name] on 03/04/2021 13:37:49 -------------------------------------------------------------------------------- Vitals Details Patient Name: Date of Service: [name], [name] 03/04/2021 1:15 PM Medical Record Number: [id number] Patient Account Number: [id number] Date of Birth/Sex: Treating RN: 1971-01-26 (50 y.o. [name] Primary Care Marinna Blane: [name] Other Clinician: Referring Patryce Depriest: Treating Samanvitha [place]/Extender: [name], [name] in Treatment: 25 Vital Signs Time Taken: 13:25 Temperature (F): 98.5 Height (in): 65 Pulse (bpm):  91 Weight (lbs): 196 Respiratory Rate (breaths/min): 16 Body Mass Index (BMI): 32.6 Blood Pressure (mmHg): 136/64 Capillary Blood Glucose (mg/dl): 145 Reference Range: 80 - 120 mg / dl Electronic Signature(s) Signed: 03/04/2021 4:36:51 PM By: [name] RN, BSN Entered By: [name] on 03/04/2021 13:29:07

## 2021-03-06 ENCOUNTER — Encounter (HOSPITAL_BASED_OUTPATIENT_CLINIC_OR_DEPARTMENT_OTHER): Payer: 59 | Admitting: Internal Medicine

## 2021-03-06 ENCOUNTER — Other Ambulatory Visit: Payer: Self-pay

## 2021-03-06 DIAGNOSIS — E1169 Type 2 diabetes mellitus with other specified complication: Secondary | ICD-10-CM | POA: Diagnosis not present

## 2021-03-06 NOTE — Progress Notes (Signed)
FELIX, MERAS ([id number]) Visit Report for 03/06/2021 Super[place] Details Patient Name: Date of Service: [place] RD, [name] 03/06/2021 Medical Record Number: [id number] Patient Account Number: [id number] Date of Birth/Sex: Treating RN: Jul 11, 1970 (50 y.o. [name], [name] Primary Care Provider: [name] Other Clinician: Referring Provider: Treating Provider/Extender: [name], [name] in Treatment: 26 Diagnosis Coding ICD-10 Codes Code Description [phone] Other chronic osteomyelitis, right ankle and foot E11.621 Type 2 diabetes mellitus with foot ulcer L97.514 Non-pressure chronic ulcer of other part of right foot with necrosis of bone T81.31XS Disruption of external operation (surgical) wound, not elsewhere classified, sequela F17.218 Nicotine dependence, cigarettes, with other nicotine-induced disorders L97.522 Non-pressure chronic ulcer of other part of left foot with fat layer exposed L84 Corns and callosities Facility Procedures CPT4 Code Description Modifier Quantity [id number] - WOUND CARE VISIT-LEV 3 EST PT 1 Electronic Signature(s) Signed: 03/06/2021 10:31:08 AM By: Kalman Shan DO Signed: 03/06/2021 11:46:22 AM By: Rhae Hammock RN Entered By: Rhae Hammock on 03/06/2021 07:48:06

## 2021-03-06 NOTE — Progress Notes (Signed)
Joseph, [name] ([id number]) Visit Report for 03/06/2021 Arrival Information Details Patient Name: Date of Service: [name], [name]. 03/06/2021 7:30 A M Medical Record Number: [id number] Patient Account Number: [id number] Date of Birth/Sex: Treating RN: 1971-02-04 (50 y.o. [name], [name] Primary Care Dalayza Zambrana: [name] Other Clinician: Referring March Steyer: Treating Magin Balbi/Extender: [name], [name] in Treatment: 26 Visit Information History Since Last Visit Added or deleted any medications: No Patient Arrived: [name] Any new allergies or adverse reactions: No Arrival Time: 07:45 Had a fall or experienced change in No Accompanied By: self activities of daily living that may affect Transfer Assistance: None risk of falls: Patient Identification Verified: Yes Signs or symptoms of abuse/neglect since last visito No Secondary Verification Process Completed: Yes Hospitalized since last visit: No Patient Requires Transmission-Based Precautions: No Implantable device outside of the clinic excluding No Patient Has Alerts: No cellular tissue based products placed in the center since last visit: Has Dressing in Place as Prescribed: Yes Pain Present Now: No Electronic Signature(s) Signed: 03/06/2021 11:46:22 AM By: Rhae Hammock RN Entered By: Rhae Hammock on 03/06/2021 07:45:39 -------------------------------------------------------------------------------- Clinic Level of Care Assessment Details Patient Name: Date of Service: [name], [place]. 03/06/2021 7:30 A M Medical Record Number: [id number] Patient Account Number: [id number] Date of Birth/Sex: Treating RN: 1971-01-20 (50 y.o. [name], [name] Primary Care Afton Mikelson: [name] Other Clinician: Referring Christos Mixson: Treating Heather Streeper/Extender: [name], [name] in Treatment: 26 Clinic Level of Care Assessment Items TOOL 4 Quantity Score X- 1 0 Use when only  an EandM is performed on FOLLOW-UP visit ASSESSMENTS - Nursing Assessment / Reassessment X- 1 10 Reassessment of Co-morbidities (includes updates in patient status) X- 1 5 Reassessment of Adherence to Treatment Plan ASSESSMENTS - Wound and Skin A ssessment / Reassessment []  - 0 Simple Wound Assessment / Reassessment - one wound X- 2 5 Complex Wound Assessment / Reassessment - multiple wounds []  - 0 Dermatologic / Skin Assessment (not related to wound area) ASSESSMENTS - Focused Assessment []  - 0 Circumferential Edema Measurements - multi extremities []  - 0 Nutritional Assessment / Counseling / Intervention []  - 0 Lower Extremity Assessment (monofilament, tuning fork, pulses) []  - 0 Peripheral Arterial Disease Assessment (using hand held doppler) ASSESSMENTS - Ostomy and/or Continence Assessment and Care []  - 0 Incontinence Assessment and Management []  - 0 Ostomy Care Assessment and Management (repouching, etc.) PROCESS - Coordination of Care X - Simple Patient / Family Education for ongoing care 1 15 []  - 0 Complex (extensive) Patient / Family Education for ongoing care X- 1 10 Staff obtains Programmer, systems, Records, T Results / Process Orders est []  - 0 Staff telephones HHA, Nursing Homes / Clarify orders / etc []  - 0 Routine Transfer to another Facility (non-emergent condition) []  - 0 Routine Hospital Admission (non-emergent condition) []  - 0 New Admissions / Biomedical engineer / Ordering NPWT Apligraf, etc. , []  - 0 Emergency Hospital Admission (emergent condition) X- 1 10 Simple Discharge Coordination []  - 0 Complex (extensive) Discharge Coordination PROCESS - Special Needs []  - 0 Pediatric / Minor Patient Management []  - 0 Isolation Patient Management []  - 0 Hearing / Language / Visual special needs []  - 0 Assessment of Community assistance (transportation, D/C planning, etc.) []  - 0 Additional assistance / Altered mentation []  - 0 Support Surface(s)  Assessment (bed, cushion, seat, etc.) INTERVENTIONS - Wound Cleansing / Measurement []  - 0 Simple Wound Cleansing - one wound X- 2 5 Complex Wound Cleansing - multiple  wounds []  - 0 Wound Imaging (photographs - any number of wounds) X- 1 5 Wound Tracing (instead of photographs) []  - 0 Simple Wound Measurement - one wound X- 2 5 Complex Wound Measurement - multiple wounds INTERVENTIONS - Wound Dressings []  - 0 Small Wound Dressing one or multiple wounds X- 2 15 Medium Wound Dressing one or multiple wounds []  - 0 Large Wound Dressing one or multiple wounds []  - 0 Application of Medications - topical []  - 0 Application of Medications - injection INTERVENTIONS - Miscellaneous []  - 0 External ear exam []  - 0 Specimen Collection (cultures, biopsies, blood, body fluids, etc.) []  - 0 Specimen(s) / Culture(s) sent or taken to Lab for analysis []  - 0 Patient Transfer (multiple staff / Civil Service fast streamer / Similar devices) []  - 0 Simple Staple / Suture removal (25 or less) []  - 0 Complex Staple / Suture removal (26 or more) []  - 0 Hypo / Hyperglycemic Management (close monitor of Blood Glucose) []  - 0 Ankle / Brachial Index (ABI) - do not check if billed separately X- 1 5 Vital Signs Has the patient been seen at the hospital within the last three years: Yes Total Score: 120 Level Of Care: New/Established - Level 4 Electronic Signature(s) Signed: 03/06/2021 11:46:22 AM By: Rhae Hammock RN Entered By: Rhae Hammock on 03/06/2021 07:48:00 -------------------------------------------------------------------------------- Encounter Discharge Information Details Patient Name: Date of Service: [name] RD, [name] 03/06/2021 7:30 A M Medical Record Number: [id number] Patient Account Number: [id number] Date of Birth/Sex: Treating RN: July 12, 1970 (50 y.o. [name], [name] Primary Care Phelicia Dantes: [name] Other Clinician: Referring Lari Linson: Treating Kassidy Dockendorf/Extender:  [name], [name] in Treatment: 26 Encounter Discharge Information Items Discharge Condition: Stable Ambulatory Status: Cane Discharge Destination: Home Transportation: Private Auto Accompanied By: self Schedule Follow-up Appointment: Yes Clinical Summary of Care: Patient Declined Electronic Signature(s) Signed: 03/06/2021 11:46:22 AM By: Rhae Hammock RN Entered By: Rhae Hammock on 03/06/2021 07:47:13 -------------------------------------------------------------------------------- Patient/Caregiver Education Details Patient Name: Date of Service: [name] RD, [place] 12/9/2022andnbsp7:30 A M Medical Record Number: [id number] Patient Account Number: [id number] Date of Birth/Gender: Treating RN: [birth year] (50 y.o. Erie Noe Primary Care Physician: [name] Other Clinician: Referring Physician: Treating Physician/Extender: [name] Weeks in Treatment: 26 Education Assessment Education Provided To: Patient Education Topics Provided Infection: Methods: Explain/Verbal Responses: State content correctly Electronic Signature(s) Signed: 03/06/2021 11:46:22 AM By: Rhae Hammock RN Entered By: Rhae Hammock on 03/06/2021 07:46:54 -------------------------------------------------------------------------------- Wound Assessment Details Patient Name: Date of Service: [name] RD, [place] 03/06/2021 7:30 A M Medical Record Number: [id number] Patient Account Number: [id number] Date of Birth/Sex: Treating RN: 23-May-1970 (50 y.o. [name], [name] Primary Care Hance Caspers: [name] Other Clinician: Referring Lakiya Cottam: Treating Seleni Meller/Extender: [name], [name] in Treatment: 26 Wound Status Wound Number: 3 Primary Etiology: Diabetic Wound/Ulcer of the Lower Extremity Wound Location: Right, Proximal, Lateral Foot Wound Status: Open Wounding Event: Gradually Appeared Date Acquired:  10/29/2020 Weeks Of Treatment: 18 Clustered Wound: No Wound Measurements Length: (cm) 1.3 Width: (cm) 0.2 Depth: (cm) 0.5 Area: (cm) 0.204 Volume: (cm) 0.102 % Reduction in Area: -61.9% % Reduction in Volume: -15.9% Wound Description Classification: Grade 3 Exudate Amount: Small Exudate Type: Serosanguineous Exudate Color: red, brown Treatment Notes Wound #3 (Foot) Wound Laterality: Right, Lateral, Proximal Cleanser Soap and Water Discharge Instruction: May shower and wash wound with dial antibacterial soap and water prior to dressing change. Wound Cleanser Discharge Instruction: Cleanse the wound with wound cleanser prior to applying  a clean dressing using gauze sponges, not tissue or cotton balls. Peri-Wound Care Sween Lotion (Moisturizing lotion) Discharge Instruction: Apply moisturizing lotion to foot with dressing changes Topical Primary Dressing Endoform 2x2 in Discharge Instruction: Lightly pack into wound bed. Secondary Dressing Woven Gauze Sponge, Non-Sterile 4x4 in Discharge Instruction: Apply over primary dressing as directed. ABD Pad, 5x9 Discharge Instruction: Apply over primary dressing as directed. Secured With [employer] 4x5 (in/yd) Discharge Instruction: Secure with Coban as directed. Kerlix Roll Sterile, 4.5x3.1 (in/yd) Discharge Instruction: Secure with Kerlix as directed. Compression Wrap Compression Stockings Add-Ons Electronic Signature(s) Signed: 03/06/2021 11:46:22 AM By: Rhae Hammock RN Entered By: Rhae Hammock on 03/06/2021 07:46:07 -------------------------------------------------------------------------------- Wound Assessment Details Patient Name: Date of Service: [name] RD, [name] 03/06/2021 7:30 A M Medical Record Number: [id number] Patient Account Number: [id number] Date of Birth/Sex: Treating RN: February 26, 1971 (50 y.o. [name], [name] Primary Care Woodard Perrell: [name] Other Clinician: Referring  Deandrea Rion: Treating Noelle Hoogland/Extender: [name], [name] in Treatment: 26 Wound Status Wound Number: 7 Primary Etiology: Diabetic Wound/Ulcer of the Lower Extremity Wound Location: Right, Lateral, Plantar Foot Wound Status: Open Wounding Event: Gradually Appeared Date Acquired: 12/31/2020 Weeks Of Treatment: 9 Clustered Wound: No Wound Measurements Length: (cm) 0.4 Width: (cm) 0.2 Depth: (cm) 2.2 Area: (cm) 0.063 Volume: (cm) 0.138 % Reduction in Area: 67.9% % Reduction in Volume: 29.6% Wound Description Classification: Grade 3 Exudate Amount: Medium Exudate Type: Serosanguineous Exudate Color: red, brown Treatment Notes Wound #7 (Foot) Wound Laterality: Plantar, Right, Lateral Cleanser Soap and Water Discharge Instruction: May shower and wash wound with dial antibacterial soap and water prior to dressing change. Wound Cleanser Discharge Instruction: Cleanse the wound with wound cleanser prior to applying a clean dressing using gauze sponges, not tissue or cotton balls. Peri-Wound Care Sween Lotion (Moisturizing lotion) Discharge Instruction: Apply moisturizing lotion to foot with dressing changes Topical Primary Dressing Endoform 2x2 in Discharge Instruction: Lightly pack into wound bed. Secondary Dressing Woven Gauze Sponge, Non-Sterile 4x4 in Discharge Instruction: Apply over primary dressing as directed. ABD Pad, 5x9 Discharge Instruction: Apply over primary dressing as directed. Secured With [employer] 4x5 (in/yd) Discharge Instruction: Secure with Coban as directed. Kerlix Roll Sterile, 4.5x3.1 (in/yd) Discharge Instruction: Secure with Kerlix as directed. Compression Wrap Compression Stockings Add-Ons Electronic Signature(s) Signed: 03/06/2021 11:46:22 AM By: Rhae Hammock RN Entered By: Rhae Hammock on 03/06/2021 07:46:07 -------------------------------------------------------------------------------- Vitals  Details Patient Name: Date of Service: [name] RD, [name] 03/06/2021 7:30 A M Medical Record Number: [id number] Patient Account Number: [id number] Date of Birth/Sex: Treating RN: [birth year] (50 y.o. [name], [name] Primary Care Lennie Vasco: [name] Other Clinician: Referring Star Resler: Treating Jaquasia Doscher/Extender: [name], [name] in Treatment: 26 Vital Signs Time Taken: 07:45 Temperature (F): 98.6 Height (in): 65 Pulse (bpm): 93 Weight (lbs): 196 Respiratory Rate (breaths/min): 17 Body Mass Index (BMI): 32.6 Blood Pressure (mmHg): 145/93 Reference Range: 80 - 120 mg / dl Electronic Signature(s) Signed: 03/06/2021 11:46:22 AM By: Rhae Hammock RN Entered By: Rhae Hammock on 03/06/2021 07:45:53

## 2021-03-09 ENCOUNTER — Encounter (HOSPITAL_BASED_OUTPATIENT_CLINIC_OR_DEPARTMENT_OTHER): Payer: 59 | Admitting: Internal Medicine

## 2021-03-09 ENCOUNTER — Other Ambulatory Visit: Payer: Self-pay

## 2021-03-09 DIAGNOSIS — E1169 Type 2 diabetes mellitus with other specified complication: Secondary | ICD-10-CM | POA: Diagnosis not present

## 2021-03-09 NOTE — Progress Notes (Signed)
[name], [name] ([id number]) Visit Report for 03/09/2021 SuperBill Details Patient Name: Date of Service: [place] RD, [name] 03/09/2021 Medical Record Number: [id number] Patient Account Number: [id number] Date of Birth/Sex: Treating RN: 1970-06-09 (50 y.o. 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

## 2021-03-09 NOTE — Progress Notes (Signed)
AKIL, HOOS ([id number]) Visit Report for 03/09/2021 Arrival Information Details Patient Name: Date of Service: [place] RD, [name] 03/09/2021 10:00 A M Medical Record Number: [id number] Patient Account Number: [id number] Date of Birth/Sex: Treating RN: 10/03/1970 (50 y.o. [name], [name] Primary Care Sharleen Szczesny: Minette Brine, [place] Other Clinician: Referring Burnette Sautter: Treating Vyolet Sakuma/Extender: [name], [name] in Treatment: 26 Visit Information History Since Last Visit Added or deleted any medications: No Patient Arrived: [name] Any new allergies or adverse reactions: No Arrival Time: 10:11 Had a fall or experienced change in No Accompanied By: self activities of daily living that may affect Transfer Assistance: None risk of falls: Patient Identification Verified: Yes Signs or symptoms of abuse/neglect since last visito No Secondary Verification Process Completed: Yes Hospitalized since last visit: No Patient Requires Transmission-Based Precautions: No Implantable device outside of the clinic excluding No Patient Has Alerts: No cellular tissue based products placed in the center since last visit: Has Dressing in Place as Prescribed: Yes Pain Present Now: No Electronic Signature(s) Signed: 03/09/2021 4:53:33 PM By: Baruch Gouty RN, BSN Entered By: Baruch Gouty on 03/09/2021 10:15:20 -------------------------------------------------------------------------------- Clinic Level of Care Assessment Details Patient Name: Date of Service: [facility] RD, [place]. 03/09/2021 10:00 A M Medical Record Number: [id number] Patient Account Number: [id number] Date of Birth/Sex: Treating RN: [birth year] (50 y.o. [name] Primary Care Dayelin Balducci: [name] Other Clinician: Referring Merly Hinkson: Treating Marsa Matteo/Extender: [name], [name] in Treatment: 26 Clinic Level of Care Assessment Items TOOL 4 Quantity Score []  - 0 Use when  only an EandM is performed on FOLLOW-UP visit ASSESSMENTS - Nursing Assessment / Reassessment X- 1 10 Reassessment of Co-morbidities (includes updates in patient status) X- 1 5 Reassessment of Adherence to Treatment Plan ASSESSMENTS - Wound and Skin A ssessment / Reassessment []  - 0 Simple Wound Assessment / Reassessment - one wound X- 2 5 Complex Wound Assessment / Reassessment - multiple wounds []  - 0 Dermatologic / Skin Assessment (not related to wound area) ASSESSMENTS - Focused Assessment []  - 0 Circumferential Edema Measurements - multi extremities []  - 0 Nutritional Assessment / Counseling / Intervention []  - 0 Lower Extremity Assessment (monofilament, tuning fork, pulses) []  - 0 Peripheral Arterial Disease Assessment (using hand held doppler) ASSESSMENTS - Ostomy and/or Continence Assessment and Care []  - 0 Incontinence Assessment and Management []  - 0 Ostomy Care Assessment and Management (repouching, etc.) PROCESS - Coordination of Care X - Simple Patient / Family Education for ongoing care 1 15 []  - 0 Complex (extensive) Patient / Family Education for ongoing care X- 1 10 Staff obtains Programmer, systems, Records, T Results / Process Orders est []  - 0 Staff telephones HHA, Nursing Homes / Clarify orders / etc []  - 0 Routine Transfer to another Facility (non-emergent condition) []  - 0 Routine Hospital Admission (non-emergent condition) []  - 0 New Admissions / Biomedical engineer / Ordering NPWT Apligraf, etc. , []  - 0 Emergency Hospital Admission (emergent condition) X- 1 10 Simple Discharge Coordination []  - 0 Complex (extensive) Discharge Coordination PROCESS - Special Needs []  - 0 Pediatric / Minor Patient Management []  - 0 Isolation Patient Management []  - 0 Hearing / Language / Visual special needs []  - 0 Assessment of Community assistance (transportation, D/C planning, etc.) []  - 0 Additional assistance / Altered mentation []  - 0 Support  Surface(s) Assessment (bed, cushion, seat, etc.) INTERVENTIONS - Wound Cleansing / Measurement []  - 0 Simple Wound Cleansing - one wound X- 2 5 Complex Wound Cleansing -  multiple wounds []  - 0 Wound Imaging (photographs - any number of wounds) []  - 0 Wound Tracing (instead of photographs) []  - 0 Simple Wound Measurement - one wound []  - 0 Complex Wound Measurement - multiple wounds INTERVENTIONS - Wound Dressings X - Small Wound Dressing one or multiple wounds 2 10 []  - 0 Medium Wound Dressing one or multiple wounds []  - 0 Large Wound Dressing one or multiple wounds []  - 0 Application of Medications - topical []  - 0 Application of Medications - injection INTERVENTIONS - Miscellaneous []  - 0 External ear exam []  - 0 Specimen Collection (cultures, biopsies, blood, body fluids, etc.) []  - 0 Specimen(s) / Culture(s) sent or taken to Lab for analysis []  - 0 Patient Transfer (multiple staff / Civil Service fast streamer / Similar devices) []  - 0 Simple Staple / Suture removal (25 or less) []  - 0 Complex Staple / Suture removal (26 or more) []  - 0 Hypo / Hyperglycemic Management (close monitor of Blood Glucose) []  - 0 Ankle / Brachial Index (ABI) - do not check if billed separately X- 1 5 Vital Signs Has the patient been seen at the hospital within the last three years: Yes Total Score: 95 Level Of Care: New/Established - Level 3 Electronic Signature(s) Signed: 03/09/2021 4:53:33 PM By: Baruch Gouty RN, BSN Entered By: Baruch Gouty on 03/09/2021 10:38:44 -------------------------------------------------------------------------------- Encounter Discharge Information Details Patient Name: Date of Service: [name], [name] 03/09/2021 10:00 A M Medical Record Number: [id number] Patient Account Number: [id number] Date of Birth/Sex: Treating RN: 08/24/1970 (50 y.o. [name] Primary Care Cissy Galbreath: [name] Other Clinician: Referring Lania Zawistowski: Treating  Renita Brocks/Extender: [name], [name] in Treatment: 26 Encounter Discharge Information Items Discharge Condition: Stable Ambulatory Status: Cane Discharge Destination: Home Transportation: Private Auto Accompanied By: self Schedule Follow-up Appointment: Yes Clinical Summary of Care: Patient Declined Electronic Signature(s) Signed: 03/09/2021 4:53:33 PM By: Baruch Gouty RN, BSN Entered By: Baruch Gouty on 03/09/2021 10:39:47 -------------------------------------------------------------------------------- Patient/Caregiver Education Details Patient Name: Date of Service: [name], [place] 12/12/2022andnbsp10:00 [place] Record Number: [id number] Patient Account Number: [id number] Date of Birth/Gender: Treating RN: [birth year] (50 y.o. [name] Primary Care Physician: [name] Other Clinician: Referring Physician: Treating Physician/Extender: [name] Weeks in Treatment: 26 Education Assessment Education Provided To: Patient Education Topics Provided Infection: Methods: Explain/Verbal Responses: Reinforcements needed, State content correctly Wound/Skin Impairment: Methods: Explain/Verbal Responses: Reinforcements needed, State content correctly Electronic Signature(s) Signed: 03/09/2021 4:53:33 PM By: Baruch Gouty RN, BSN Entered By: Baruch Gouty on 03/09/2021 10:39:30 -------------------------------------------------------------------------------- Wound Assessment Details Patient Name: Date of Service: [name], [name] 03/09/2021 10:00 A M Medical Record Number: [id number] Patient Account Number: [id number] Date of Birth/Sex: Treating RN: 19-Oct-1970 (51 y.o. [name] Primary Care Kaliq Lege: [name] Other Clinician: Referring Darrien Belter: Treating Shaka Zech/Extender: [name], [name] in Treatment: 26 Wound Status Wound Number: 3 Primary Diabetic Wound/Ulcer of  the Lower Extremity Etiology: Wound Location: Right, Proximal, Lateral Foot Wound Open Wounding Event: Gradually Appeared Status: Date Acquired: 10/29/2020 Comorbid Anemia, Coronary Artery Disease, Hypertension, Type II Weeks Of Treatment: 18 History: Diabetes, Osteomyelitis, Neuropathy Clustered Wound: No Wound Measurements Length: (cm) 1.3 Width: (cm) 0.2 Depth: (cm) 0.5 Area: (cm) 0.204 Volume: (cm) 0.102 % Reduction in Area: -61.9% % Reduction in Volume: -15.9% Epithelialization: Medium (34-66%) Wound Description Classification: Grade 3 Wound Margin: Well defined, not attached Exudate Amount: Medium Exudate Type: Serosanguineous Exudate Color: red, brown Foul Odor After Cleansing: No Slough/Fibrino  No Wound Bed Granulation Amount: Large (67-100%) Exposed Structure Granulation Quality: Red Fascia Exposed: No Necrotic Amount: None Present (0%) Fat Layer (Subcutaneous Tissue) Exposed: Yes Tendon Exposed: No Muscle Exposed: No Joint Exposed: No Bone Exposed: Yes Treatment Notes Wound #3 (Foot) Wound Laterality: Right, Lateral, Proximal Cleanser Soap and Water Discharge Instruction: May shower and wash wound with dial antibacterial soap and water prior to dressing change. Wound Cleanser Discharge Instruction: Cleanse the wound with wound cleanser prior to applying a clean dressing using gauze sponges, not tissue or cotton balls. Peri-Wound Care Sween Lotion (Moisturizing lotion) Discharge Instruction: Apply moisturizing lotion to foot with dressing changes Topical Primary Dressing Endoform 2x2 in Discharge Instruction: Lightly pack into wound bed. Secondary Dressing Woven Gauze Sponge, Non-Sterile 4x4 in Discharge Instruction: Apply over primary dressing as directed. ABD Pad, 5x9 Discharge Instruction: Apply over primary dressing as directed. Secured With [employer] 4x5 (in/yd) Discharge Instruction: Secure with Coban as directed. Kerlix Roll  Sterile, 4.5x3.1 (in/yd) Discharge Instruction: Secure with Kerlix as directed. Compression Wrap Compression Stockings Add-Ons Electronic Signature(s) Signed: 03/09/2021 4:53:33 PM By: Baruch Gouty RN, BSN Entered By: Baruch Gouty on 03/09/2021 10:36:43 -------------------------------------------------------------------------------- Wound Assessment Details Patient Name: Date of Service: [name], [name] 03/09/2021 10:00 A M Medical Record Number: [id number] Patient Account Number: [id number] Date of Birth/Sex: Treating RN: 11-Jul-1970 (50 y.o. [name] Primary Care Joliet Mallozzi: [name] Other Clinician: Referring Keiandra Sullenger: Treating Maika Mcelveen/Extender: [name], [name] in Treatment: 26 Wound Status Wound Number: 7 Primary Diabetic Wound/Ulcer of the Lower Extremity Etiology: Wound Location: Right, Lateral, Plantar Foot Wound Open Wounding Event: Gradually Appeared Status: Date Acquired: 12/31/2020 Comorbid Anemia, Coronary Artery Disease, Hypertension, Type II Weeks Of Treatment: 9 History: Diabetes, Osteomyelitis, Neuropathy Clustered Wound: No Wound Measurements Length: (cm) 0.4 Width: (cm) 0.2 Depth: (cm) 2.2 Area: (cm) 0.063 Volume: (cm) 0.138 % Reduction in Area: 67.9% % Reduction in Volume: 29.6% Epithelialization: Small (1-33%) Tunneling: Yes Wound Description Classification: Grade 3 Wound Margin: Well defined, not attached Exudate Amount: Medium Exudate Type: Serosanguineous Exudate Color: red, brown Foul Odor After Cleansing: No Slough/Fibrino No Wound Bed Granulation Amount: Large (67-100%) Exposed Structure Granulation Quality: Red Fascia Exposed: No Necrotic Amount: None Present (0%) Fat Layer (Subcutaneous Tissue) Exposed: Yes Tendon Exposed: No Muscle Exposed: No Joint Exposed: No Bone Exposed: Yes Treatment Notes Wound #7 (Foot) Wound Laterality: Plantar, Right, Lateral Cleanser Soap and  Water Discharge Instruction: May shower and wash wound with dial antibacterial soap and water prior to dressing change. Wound Cleanser Discharge Instruction: Cleanse the wound with wound cleanser prior to applying a clean dressing using gauze sponges, not tissue or cotton balls. Peri-Wound Care Sween Lotion (Moisturizing lotion) Discharge Instruction: Apply moisturizing lotion to foot with dressing changes Topical Primary Dressing Endoform 2x2 in Discharge Instruction: Lightly pack into wound bed. Secondary Dressing Woven Gauze Sponge, Non-Sterile 4x4 in Discharge Instruction: Apply over primary dressing as directed. ABD Pad, 5x9 Discharge Instruction: Apply over primary dressing as directed. Secured With [employer] 4x5 (in/yd) Discharge Instruction: Secure with Coban as directed. Kerlix Roll Sterile, 4.5x3.1 (in/yd) Discharge Instruction: Secure with Kerlix as directed. Compression Wrap Compression Stockings Add-Ons Electronic Signature(s) Signed: 03/09/2021 4:53:33 PM By: Baruch Gouty RN, BSN Entered By: Baruch Gouty on 03/09/2021 10:37:40 -------------------------------------------------------------------------------- Vitals Details Patient Name: Date of Service: [name], [name] 03/09/2021 10:00 A M Medical Record Number: [id number] Patient Account Number: [id number] Date of Birth/Sex: Treating RN: 1970-12-09 (50 y.o. [name] Primary Care Mithran Strike:  Minette Brine, [place] Other Clinician: Referring Gwendlyn Hanback: Treating Azaan Leask/Extender: [name], [name] in Treatment: 26 Vital Signs Time Taken: 10:15 Temperature (F): 98 Height (in): 65 Pulse (bpm): 84 Source: Stated Respiratory Rate (breaths/min): 18 Weight (lbs): 196 Blood Pressure (mmHg): 140/77 Source: Stated Capillary Blood Glucose (mg/dl): 155 Body Mass Index (BMI): 32.6 Reference Range: 80 - 120 mg / dl Notes glucose per pt report this am Electronic  Signature(s) Signed: 03/09/2021 4:53:33 PM By: Baruch Gouty RN, BSN Entered By: Baruch Gouty on 03/09/2021 10:18:02

## 2021-03-11 ENCOUNTER — Other Ambulatory Visit: Payer: Self-pay

## 2021-03-11 ENCOUNTER — Other Ambulatory Visit: Payer: Self-pay | Admitting: Family

## 2021-03-11 ENCOUNTER — Encounter (HOSPITAL_BASED_OUTPATIENT_CLINIC_OR_DEPARTMENT_OTHER): Payer: 59 | Admitting: Physician Assistant

## 2021-03-11 DIAGNOSIS — E1142 Type 2 diabetes mellitus with diabetic polyneuropathy: Secondary | ICD-10-CM

## 2021-03-11 DIAGNOSIS — E1169 Type 2 diabetes mellitus with other specified complication: Secondary | ICD-10-CM | POA: Diagnosis not present

## 2021-03-11 NOTE — Progress Notes (Signed)
AVIV, LENGACHER ([id number]) Visit Report for 03/11/2021 Chief Complaint Document Details Patient Name: Date of Service: [place] [address], [name] 03/11/2021 7:30 A M Medical Record Number: [id number] Patient Account Number: [id number] Date of Birth/Sex: Treating RN: December 28, 1970 (50 y.o. [name] Primary Care Provider: [name] Other Clinician: Referring Provider: Treating Provider/Extender: [name], [name] in Treatment: 26 Information Obtained from: Patient Chief Complaint 09/05/2020; patient is here for review of the wound extensively on a right TMA amputation site. He also has a wound on the plantar aspect of the left foot Electronic Signature(s) Signed: 03/11/2021 3:33:06 PM By: Worthy Keeler PA-C Entered By: Worthy Keeler on 03/11/2021 08:16:18 -------------------------------------------------------------------------------- HPI Details Patient Name: Date of Service: [name], [name] 03/11/2021 7:30 A M Medical Record Number: [id number] Patient Account Number: [id number] Date of Birth/Sex: Treating RN: Nov 11, 1970 (50 y.o. [name] Primary Care Provider: [name] Other Clinician: Referring Provider: Treating Provider/Extender: [name], [name] in Treatment: 26 History of Present Illness HPI Description: ADMISSION 09/05/2020 This is a 50 year old man who has type 2 diabetes. Essentially the problem began admitted to [facility] in December with infection gas gangrene. Cultures ultimately grew staph lugdunensis. 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 March Rummage. Shortly thereafter he developed a nonhealing wound with wound dehiscence noted on 05/09/2020. He had a wound VAC I think for a period of time after the surgery but it did not help. He has undergone an IandD and skin graft on the right foot on 05/16/2020. He underwent a further IandD on 06/04/2020 not debrided to bone. Noted to  have exposed bone with osteomyelitis on 07/29/2020 he underwent an operative debridement with resection of the metatarsal. Culture of the metatarsal grew Enterobacter cloacae I. He has been followed by Dr. [name] of infectious disease. Currently on Levaquin and doxycycline I think at about the 4-week of 6 mark. At the last visit with Dr. March Rummage he was supposed to have use Santyl although I do not think he has it and he has been using I think a Betadine wet-to-dry. He is referred here by Dr. [name] for our review who saw him yesterday. I cannot see that he has had imaging studies of the right foot. No MRI Past medical history includes type 2 diabetes, in 2021 and ulcer of the left foot that ultimately healed with a wound VAC, stage III chronic kidney disease, first toe amputations bilaterally, hypertension, still a 1/2 pack/day smoker. The patient had ABIs on 08/09/2018 which was 1.00 on the right and 0.96 on the left both areas had triphasic waveforms. Not felt to have an arterial issue. 09/12/2020; 1 week follow-up. He has been doing the dressings along with home health. This is a deep punched out area in the incision of his transmit site. We use silver alginate last week to help with drainage and antibacterial properties. He tolerated this well 6/24; the patient was actually at his podiatrist this morning who debrided the wound. We have been using silver alginate. They are going to follow him in 6 weeks. 6/29; patient is using silver alginate. Apparently saw Dr. March Rummage this week although I have not checked his note I will try to do so. He is following up in 6 weeks. This was the original transmetatarsal amputation when I first saw this 3 mid part of his amputation site was deep down to bone however this seems to have progressively close down which  is gratifying to see. He still has a fairly callus uneven surface however spreading this apart its difficult to identify anything that does not look epithelialized.  When I first saw this I thought he would require an extensive debridement perhaps back in the OR by Dr. March Rummage however all of this appears to be looking better and at this point I would continue with the silver alginate see if this holds together over the next several weeks. He does not have an arterial issue 7/13; 2-week follow-up. Unfortunately the area did not hold together there was drainage on his dressing. Still copious amounts of callus. It was difficult to see where this actually was open therefore I elected to go ahead with a vigorous debridement. Still using silver alginate 7/20 I brought him back after extensive callus debridement last week. The only open area was on the medial part of the foot extensive debridement of thick surrounding callus and I was able to do identify the remaining tunneling area. 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 This is the area that we may have been following. This is smaller this week but still surrounded with thick callus He came in today with a large area of callus on the left midfoot. This I had noticed previously. HOWEVER our intake nurse clearly incorrectly documented this as opening. The callus and split there was an odor so all of this had to be removed. 10/29/2020 unfortunately upon evaluation today this patient appears to have significant mount of callus noted at this point. There does not appear to be any signs of active infection systemically which is great although locally he is having some discomfort around the lateral portion of his foot on the right. The left foot is no having a lot of callus I am not certain even has an open wound if there is anything is extremely tiny. 11/05/2020 upon evaluation today patient's wounds actually appear to be doing a bit better compared to last week since we have uncovered some of these areas I think there are some definite improvements. With that  being said there is still quite a bit of tunneling and undermining at many locations that again has me concerned I still think that the MRI is the best thing to do he has that scheduled for Saturday. 11/26/2020 upon evaluation today patient appears to be doing decently well in regard to his foot in general. He did have an MRI I did review this and he had multiple findings on MRI consistent with osteomyelitis across the board in regard pretty much to his remaining metatarsal regions. Nonetheless there was also some evidence of some involvement of the navicular bone as well. Either way I think that this patient actually is in desperate need of get him in for hyperbaric oxygen therapy we discussed that today that will be detailed in the plan. Nonetheless he is also still on the doxycycline which I think is appropriate for the time being as well I did go ahead and place him on that following the results of the MRI based on what we were seeing. I gave him that prescription for 14 days for the time being and we will go from there. 12/03/2020 upon evaluation today patient appears to be doing decently well in regard to his wounds in general in regard to the right foot. With that being said he does have a callus on the left foot in the end there  was actually an area open area here that will need to be addressed. This is small and I think we take care of it now hopefully will heal quite readily. Fortunately I do not see any signs of infection worsening I am getting need to refill the antibiotic for him today. He has been taking doxycycline which seems to be doing well. We will also get a go ahead and get him approved for HBO therapy. 12/31/2020 upon evaluation today patient appears to be doing really roughly the same as compared to last time I saw him. Fortunately there is no evidence of active infection which is great news systemically he has started hyperbarics he seems to be doing decently well he said a couple  times where anxiety kind of got to him but overall I think he is doing awesome. He is no longer on the antibiotic therapy. With that being said I do believe that he is going to need likely some additional antibiotics based on what I am seeing today we can actually obtain a bone culture from the plantar wound that was hiding underneath the callus. He was having pain in this area which is what may be try to clear away the callus to look sure enough there was an actual opening that went deeper into the wound bed region here. 01/21/2021 upon evaluation today patient appears to be doing about the same in regard to his foot in general. He does have some depth to some of the wound areas I really think he would benefit from is trying to see about getting him set up for a gauze VAC. I think that the gauze VAC could be helpful for him and will work much more effectively with what we are seeing as compared to traditional foam wound VAC. The patient is in agreement with giving this a trial. Nonetheless unfortunately think that he overall seems to be doing really well and I think we are headed in the right direction as far as the overall healing is concerned. 01/28/2021 upon evaluation today patient appears to be doing well with regard to his wound all things considered he does have a lot of callus buildup especially on the plantar aspect of the left foot. There is no wound here but a significant amount of callus. That is getting need to be addressed today. Subsequently he also has a lot of callus on the right foot as well as some of the area around the wounds actually being significantly callused. Again I need to clear this away today. 02/04/2021 upon evaluation today patient actually seems to be doing quite well with regard to his foot ulcer. I do believe that the hyperbaric oxygen therapy has been beneficial for him thus far also think that he is doing well with the wound VAC. In combination I think he has an  excellent plan at this time as far as treatment is concerned. I do not see any signs of evidence of infection which is great news as well. 02/11/2021 upon evaluation today patient appears to be doing okay in regard to his foot ulcer though still has deep wounds that do not seem to be doing nearly as good as what I would like to see. I think that the wound VAC is helping but I do believe there still bone in the base of the wound I think we probably need to see about put him back on an antibiotic, do a PCR culture today to see what we find in order to determine  whether or not we need to have him on antibiotics which I think we probably do is just a matter which 1. 02/18/2021 upon evaluation today patient appears to be doing okay with regard to his foot ulcer. He has been using the wound VAC although there is a lot of drainage and to be honest I am not certain this is doing as well as we wanted to right now especially considering the fact the soles of gotten so small which in some ways is good but in other ways is still problematic for Sorbact standpoint. I am not even certain we will be able to reinitiate the VAC even next week. Nonetheless for now he is on the [place] which is definitely something that we need to get going I am also can see about a referral back to infectious disease to see what they have to say in regard to what is going on currently as well. He voiced understanding and is in agreement with that plan. I did explain this is a very touchy go situation which hinges on [place] getting this healed and the infection improved if not he is looking at amputation. Obviously this is a limb salvage Plan at this point. 02/25/2021 upon evaluation today patient's wound bed actually showed signs of being much drier and much less macerated compared to last week. We had a wound VAC on hold for this week. With that being said based on what I am seeing I think we probably just want to cancel the wound VAC  altogether. I do not think it was doing enough for him to warrant the maceration and risks that are associated. He is on the [place] at this point. He also has an infection disease appointment next week. We will see what they have to say at this juncture. Nonetheless currently he is wrapping up his first round of hyperbaric oxygen therapy we could consider repeat although the biggest issue right now is simply that he actually is having surgery on his limbs due to cataract issues that is tomorrow. Following that surgery I am not certain he really needs to be in the chamber for some period of time we need to see what his surgeon says. 03/04/2021 upon evaluation today patient's foot actually does appear to be doing better which is great news. I do not see any signs of infection he has been tolerating the [place] though he is done with that currently I think we probably need to reopen this for a little bit longer to ensure that he is completely treated. He is in agreement with this plan. Subsequently I will send that into the pharmacy for him today. 03/11/2021 upon evaluation today patient's wounds on the foot actually appear to still be doing about the same. I do not see any evidence of significant improvement which is not good although it is also not significantly worse which is good. It overall he is going to start back on hyperbarics this coming Monday. Obviously I think that can be beneficial for him as well and is going to likely help out with getting this hopefully close. Electronic Signature(s) Signed: 03/11/2021 1:19:02 PM By: Worthy Keeler PA-C Entered By: Worthy Keeler on 03/11/2021 13:19:02 -------------------------------------------------------------------------------- Paring/cutting 1 benign hyperkeratotic lesion Details Patient Name: Date of Service: [place] RD, [name] 03/11/2021 7:30 A M Medical Record Number: [id number] Patient Account Number: [id number] Date of Birth/Sex: Treating  RN: Jul 07, 1970 (50 y.o. [name] Primary Care Provider: [name] Other Clinician: Referring  Provider: Treating Provider/Extender: [name], [name] in Treatment: 26 Procedure Performed for: Non-Wound Location Performed By: Physician Worthy Keeler, PA Post Procedure Diagnosis Same as Pre-procedure Notes parring of callous to right foot amputation site. Electronic Signature(s) Signed: 03/11/2021 3:33:06 PM By: Worthy Keeler PA-C Signed: 03/11/2021 5:27:53 PM By: [name] RN, BSN Entered By: [name] on 03/11/2021 60:63:01 -------------------------------------------------------------------------------- Physical Exam Details Patient Name: Date of Service: [name], [name] 03/11/2021 7:30 A M Medical Record Number: [id number] Patient Account Number: [id number] Date of Birth/Sex: Treating RN: 05-Feb-1971 (50 y.o. [name] Primary Care Provider: [name] Other Clinician: Referring Provider: Treating Provider/Extender: [name], [name] in Treatment: 59 Constitutional Well-nourished and well-hydrated in no acute distress. Respiratory normal breathing without difficulty. Psychiatric this patient is able to make decisions and demonstrates good insight into disease process. Alert and Oriented x 3. pleasant and cooperative. Notes Upon inspection patient's wound bed actually showed signs of good granulation and epithelization at this point. There was still however depth to the wounds that unfortunately showed that he has probing down to bone especially from the lateral aspect of the plantar aspect I cannot really get down to the bone which is good news we been using endoform up to this point. Electronic Signature(s) Signed: 03/11/2021 1:19:33 PM By: Worthy Keeler PA-C Entered By: Worthy Keeler on 03/11/2021 13:19:33 -------------------------------------------------------------------------------- Physician  Orders Details Patient Name: Date of Service: [name], [name] 03/11/2021 7:30 A M Medical Record Number: [id number] Patient Account Number: [id number] Date of Birth/Sex: Treating RN: 1970/11/03 (50 y.o. [name] Primary Care Provider: [name] Other Clinician: Referring Provider: Treating Provider/Extender: [name], [name] in Treatment: 26 Verbal / Phone Orders: No Diagnosis Coding ICD-10 Coding Code Description [phone] Other chronic osteomyelitis, right ankle and foot E11.621 Type 2 diabetes mellitus with foot ulcer L97.514 Non-pressure chronic ulcer of other part of right foot with necrosis of bone T81.31XS Disruption of external operation (surgical) wound, not elsewhere classified, sequela F17.218 Nicotine dependence, cigarettes, with other nicotine-induced disorders L97.522 Non-pressure chronic ulcer of other part of left foot with fat layer exposed L84 Corns and callosities Follow-up Appointments ppointment in 1 week. Margarita Grizzle Return A Start hyberbaric Monday 03/16/2021 Nurse Visit: - Mondays and Fridays Bathing/ Shower/ Hygiene May shower with protection but do not get wound dressing(s) wet. Edema Control - Lymphedema / SCD / Other Elevate legs to the level of the heart or above for 30 minutes daily and/or when sitting, a frequency of: Avoid standing for long periods of time. Moisturize legs daily. - to foot with dressing changes Off-Loading Open toe surgical shoe to: - Wear at all times except driving. on right foot Other: - may wear shoe with memory foam insert to left foot, cut hole where callous is and insert in shoe to help offload Additional Orders / Instructions Stop/Decrease Smoking Follow Nutritious Diet Non Wound Condition Other Non Wound Condition Orders/Instructions: - cushion bottom of left foot with orthopedic felt or foam Hyperbaric Oxygen Therapy Evaluate for HBO Therapy Indication: - wagner grade 3 diabetic foot  ulcer of right foot If appropriate for treatment, begin HBOT per protocol: 2.5 ATA for 90 Minutes with 2 Five (5) Minute A Breaks ir Total Number of Treatments: - additional 20 One treatments per day (delivered Monday through Friday unless otherwise specified in Special Instructions below): Finger stick Blood Glucose Pre- and Post- HBOT Treatment. Follow Hyperbaric Oxygen Glycemia Protocol  A frin (Oxymetazoline HCL) 0.05% nasal spray - 1 spray in both nostrils daily as needed prior to HBO treatment for difficulty clearing ears Wound Treatment Wound #3 - Foot Wound Laterality: Right, Lateral, Proximal Cleanser: Soap and Water 3 x Per Week/30 Days Discharge Instructions: May shower and wash wound with dial antibacterial soap and water prior to dressing change. Cleanser: Wound Cleanser 3 x Per Week/30 Days Discharge Instructions: Cleanse the wound with wound cleanser prior to applying a clean dressing using gauze sponges, not tissue or cotton balls. Peri-Wound Care: Sween Lotion (Moisturizing lotion) 3 x Per Week/30 Days Discharge Instructions: Apply moisturizing lotion to foot with dressing changes Prim Dressing: Endoform 2x2 in 3 x Per Week/30 Days ary Discharge Instructions: Lightly pack into wound bed. Secondary Dressing: Woven Gauze Sponge, Non-Sterile 4x4 in 3 x Per Week/30 Days Discharge Instructions: Apply over primary dressing as directed. Secondary Dressing: ABD Pad, 5x9 3 x Per Week/30 Days Discharge Instructions: Apply over primary dressing as directed. Compression Wrap: Kerlix Roll 4.5x3.1 (in/yd) 3 x Per Week/30 Days Discharge Instructions: Apply Kerlix and Coban compression as directed. Compression Wrap: Coban Self-Adherent Wrap 4x5 (in/yd) 3 x Per Week/30 Days Discharge Instructions: Apply over Kerlix as directed. Wound #7 - Foot Wound Laterality: Plantar, Right, Lateral Cleanser: Soap and Water 3 x Per Week/30 Days Discharge Instructions: May shower and wash wound with  dial antibacterial soap and water prior to dressing change. Cleanser: Wound Cleanser 3 x Per Week/30 Days Discharge Instructions: Cleanse the wound with wound cleanser prior to applying a clean dressing using gauze sponges, not tissue or cotton balls. Peri-Wound Care: Sween Lotion (Moisturizing lotion) 3 x Per Week/30 Days Discharge Instructions: Apply moisturizing lotion to foot with dressing changes Prim Dressing: Endoform 2x2 in 3 x Per Week/30 Days ary Discharge Instructions: Lightly pack into wound bed. Secondary Dressing: Woven Gauze Sponge, Non-Sterile 4x4 in 3 x Per Week/30 Days Discharge Instructions: Apply over primary dressing as directed. Secondary Dressing: ABD Pad, 5x9 3 x Per Week/30 Days Discharge Instructions: Apply over primary dressing as directed. Compression Wrap: Kerlix Roll 4.5x3.1 (in/yd) 3 x Per Week/30 Days Discharge Instructions: Apply Kerlix and Coban compression as directed. Compression Wrap: Coban Self-Adherent Wrap 4x5 (in/yd) 3 x Per Week/30 Days Discharge Instructions: Apply over Kerlix as directed. GLYCEMIA INTERVENTIONS PROTOCOL PRE-HBO GLYCEMIA INTERVENTIONS ACTION INTERVENTION Obtain pre-HBO capillary blood glucose (ensure 1 physician order is in chart). A. Notify HBO physician and await physician orders. 2 If result is 70 mg/dl or below: B. If the result meets the hospital definition of a critical result, follow hospital policy. A. Give patient an 8 ounce Glucerna Shake, an 8 ounce Ensure, or 8 ounces of a Glucerna/Ensure equivalent dietary supplement*. B. Wait 30 minutes. If result is 71 mg/dl to 130 mg/dl: C. Retest patients capillary blood glucose (CBG). D. If result greater than or equal to 110 mg/dl, proceed with HBO. If result less than 110 mg/dl, notify HBO physician and consider holding HBO. If result is 131 mg/dl to 249 mg/dl: A. Proceed with HBO. A. Notify HBO physician and await physician orders. B. It is recommended to hold  HBO and do If result is 250 mg/dl or greater: blood/urine ketone testing. C. If the result meets the hospital definition of a critical result, follow hospital policy. POST-HBO GLYCEMIA INTERVENTIONS ACTION INTERVENTION Obtain post HBO capillary blood glucose (ensure 1 physician order is in chart). A. Notify HBO physician and await physician orders. 2 If result is 70 mg/dl or below: B. If the  result meets the hospital definition of a critical result, follow hospital policy. A. Give patient an 8 ounce Glucerna Shake, an 8 ounce Ensure, or 8 ounces of a Glucerna/Ensure equivalent dietary supplement*. B. Wait 15 minutes for symptoms of If result is 71 mg/dl to 100 mg/dl: hypoglycemia (i.e. nervousness, anxiety, sweating, chills, clamminess, irritability, confusion, tachycardia or dizziness). C. If patient asymptomatic, discharge patient. If patient symptomatic, repeat capillary blood glucose (CBG) and notify HBO physician. If result is 101 mg/dl to 249 mg/dl: A. Discharge patient. A. Notify HBO physician and await physician orders. B. It is recommended to do blood/urine ketone If result is 250 mg/dl or greater: testing. C. If the result meets the hospital definition of a critical result, follow hospital policy. *Juice or candies are NOT equivalent products. If patient refuses the Glucerna or Ensure, please consult the hospital dietitian for an appropriate substitute. Electronic Signature(s) Signed: 03/11/2021 3:33:06 PM By: Worthy Keeler PA-C Signed: 03/11/2021 5:27:53 PM By: [name] RN, BSN Entered By: [name] on 03/11/2021 08:24:48 -------------------------------------------------------------------------------- Problem List Details Patient Name: Date of Service: [name], [place] 03/11/2021 7:30 A M Medical Record Number: [id number] Patient Account Number: [id number] Date of Birth/Sex: Treating RN: [birth year] (50 y.o. [name] Primary Care  Provider: Minette Brine, [place] Other Clinician: Referring Provider: Treating Provider/Extender: [name], [name] in Treatment: 26 Active Problems ICD-10 Encounter Code Description Active Date MDM Diagnosis [phone] Other chronic osteomyelitis, right ankle and foot 09/05/2020 No Yes E11.621 Type 2 diabetes mellitus with foot ulcer 09/05/2020 No Yes L97.514 Non-pressure chronic ulcer of other part of right foot with necrosis of bone 09/05/2020 No Yes T81.31XS Disruption of external operation (surgical) wound, not elsewhere classified, 09/05/2020 No Yes sequela F17.218 Nicotine dependence, cigarettes, with other nicotine-induced disorders 11/26/2020 No Yes L97.522 Non-pressure chronic ulcer of other part of left foot with fat layer exposed 10/22/2020 No Yes L84 Corns and callosities 01/28/2021 No Yes Inactive Problems Resolved Problems Electronic Signature(s) Signed: 03/11/2021 3:33:06 PM By: Worthy Keeler PA-C Entered By: Worthy Keeler on 03/11/2021 08:16:00 -------------------------------------------------------------------------------- Progress Note Details Patient Name: Date of Service: [name], [name] 03/11/2021 7:30 A M Medical Record Number: [id number] Patient Account Number: [id number] Date of Birth/Sex: Treating RN: 08-18-1970 (50 y.o. [name] Primary Care Provider: [name] Other Clinician: Referring Provider: Treating Provider/Extender: [name], [name] in Treatment: 26 Subjective Chief Complaint Information obtained from Patient 09/05/2020; patient is here for review of the wound extensively on a right TMA amputation site. He also has a wound on the plantar aspect of the left foot History of Present Illness (HPI) ADMISSION 09/05/2020 This is a 50 year old man who has type 2 diabetes. Essentially the problem began admitted to [facility] in December with infection gas gangrene. Cultures ultimately grew staph lugdunensis.  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 March Rummage. Shortly thereafter he developed a nonhealing wound with wound dehiscence noted on 05/09/2020. He had a wound VAC I think for a period of time after the surgery but it did not help. He has undergone an IandD and skin graft on the right foot on 05/16/2020. He underwent a further IandD on 06/04/2020 not debrided to bone. 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 Culture of the metatarsal grew Enterobacter cloacae I. He has been followed by Dr. [name] of infectious disease. Currently on Levaquin and doxycycline  I think at about the 4-week of 6 mark. At the last visit with Dr. March Rummage he was supposed to have use Santyl although I do not think he has it and he has been using I think a Betadine wet-to-dry. He is referred here by Dr. [name] for our review who saw him yesterday. I cannot see that he has had imaging studies of the right foot. No MRI Past medical history includes type 2 diabetes, in 2021 and ulcer of the left foot that ultimately healed with a wound VAC, stage III chronic kidney disease, first toe amputations bilaterally, hypertension, still a 1/2 pack/day smoker. The patient had ABIs on 08/09/2018 which was 1.00 on the right and 0.96 on the left both areas had triphasic waveforms. Not felt to have an arterial issue. 09/12/2020; 1 week follow-up. He has been doing the dressings along with home health. This is a deep punched out area in the incision of his transmit site. We use silver alginate last week to help with drainage and antibacterial properties. He tolerated this well 6/24; the patient was actually at his podiatrist this morning who debrided the wound. We have been using silver alginate. They are going to follow him in 6 weeks. 6/29; patient is using silver alginate. Apparently saw Dr. March Rummage this week although I have not checked his  note I will try to do so. He is following up in 6 weeks. This was the original transmetatarsal amputation when I first saw this 3 mid part of his amputation site was deep down to bone however this seems to have progressively close down which is gratifying to see. He still has a fairly callus uneven surface however spreading this apart its difficult to identify anything that does not look epithelialized. When I first saw this I thought he would require an extensive debridement perhaps back in the OR by Dr. March Rummage however all of this appears to be looking better and at this point I would continue with the silver alginate see if this holds together over the next several weeks. He does not have an arterial issue 7/13; 2-week follow-up. Unfortunately the area did not hold together there was drainage on his dressing. Still copious amounts of callus. It was difficult to see where this actually was open therefore I elected to go ahead with a vigorous debridement. Still using silver alginate 7/20 I brought him back after extensive callus debridement last week. The only open area was on the medial part of the foot extensive debridement of thick surrounding callus and I was able to do identify the remaining tunneling area. 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 This is the area that we may have been following. This is smaller this week but still surrounded with thick callus He came in today with a large area of callus on the left midfoot. This I had noticed previously. HOWEVER our intake nurse clearly incorrectly documented this as opening. The callus and split there was an odor so all of this had to be removed. 10/29/2020 unfortunately upon evaluation today this patient appears to have significant mount of callus noted at this point. There does not appear to be any signs of active infection systemically which is great although locally he is having  some discomfort around the lateral portion of his foot on the right. The left foot is no having a lot of callus I am not certain even has an open wound  if there is anything is extremely tiny. 11/05/2020 upon evaluation today patient's wounds actually appear to be doing a bit better compared to last week since we have uncovered some of these areas I think there are some definite improvements. With that being said there is still quite a bit of tunneling and undermining at many locations that again has me concerned I still think that the MRI is the best thing to do he has that scheduled for Saturday. 11/26/2020 upon evaluation today patient appears to be doing decently well in regard to his foot in general. He did have an MRI I did review this and he had multiple findings on MRI consistent with osteomyelitis across the board in regard pretty much to his remaining metatarsal regions. Nonetheless there was also some evidence of some involvement of the navicular bone as well. Either way I think that this patient actually is in desperate need of get him in for hyperbaric oxygen therapy we discussed that today that will be detailed in the plan. Nonetheless he is also still on the doxycycline which I think is appropriate for the time being as well I did go ahead and place him on that following the results of the MRI based on what we were seeing. I gave him that prescription for 14 days for the time being and we will go from there. 12/03/2020 upon evaluation today patient appears to be doing decently well in regard to his wounds in general in regard to the right foot. With that being said he does have a callus on the left foot in the end there was actually an area open area here that will need to be addressed. This is small and I think we take care of it now hopefully will heal quite readily. Fortunately I do not see any signs of infection worsening I am getting need to refill the antibiotic for him today. He  has been taking doxycycline which seems to be doing well. We will also get a go ahead and get him approved for HBO therapy. 12/31/2020 upon evaluation today patient appears to be doing really roughly the same as compared to last time I saw him. Fortunately there is no evidence of active infection which is great news systemically he has started hyperbarics he seems to be doing decently well he said a couple times where anxiety kind of got to him but overall I think he is doing awesome. He is no longer on the antibiotic therapy. With that being said I do believe that he is going to need likely some additional antibiotics based on what I am seeing today we can actually obtain a bone culture from the plantar wound that was hiding underneath the callus. He was having pain in this area which is what may be try to clear away the callus to look sure enough there was an actual opening that went deeper into the wound bed region here. 01/21/2021 upon evaluation today patient appears to be doing about the same in regard to his foot in general. He does have some depth to some of the wound areas I really think he would benefit from is trying to see about getting him set up for a gauze VAC. I think that the gauze VAC could be helpful for him and will work much more effectively with what we are seeing as compared to traditional foam wound VAC. The patient is in agreement with giving this a trial. Nonetheless unfortunately think that he overall seems to be doing  really well and I think we are headed in the right direction as far as the overall healing is concerned. 01/28/2021 upon evaluation today patient appears to be doing well with regard to his wound all things considered he does have a lot of callus buildup especially on the plantar aspect of the left foot. There is no wound here but a significant amount of callus. That is getting need to be addressed today. Subsequently he also has a lot of callus on the right  foot as well as some of the area around the wounds actually being significantly callused. Again I need to clear this away today. 02/04/2021 upon evaluation today patient actually seems to be doing quite well with regard to his foot ulcer. I do believe that the hyperbaric oxygen therapy has been beneficial for him thus far also think that he is doing well with the wound VAC. In combination I think he has an excellent plan at this time as far as treatment is concerned. I do not see any signs of evidence of infection which is great news as well. 02/11/2021 upon evaluation today patient appears to be doing okay in regard to his foot ulcer though still has deep wounds that do not seem to be doing nearly as good as what I would like to see. I think that the wound VAC is helping but I do believe there still bone in the base of the wound I think we probably need to see about put him back on an antibiotic, do a PCR culture today to see what we find in order to determine whether or not we need to have him on antibiotics which I think we probably do is just a matter which 1. 02/18/2021 upon evaluation today patient appears to be doing okay with regard to his foot ulcer. He has been using the wound VAC although there is a lot of drainage and to be honest I am not certain this is doing as well as we wanted to right now especially considering the fact the soles of gotten so small which in some ways is good but in other ways is still problematic for Sorbact standpoint. I am not even certain we will be able to reinitiate the VAC even next week. Nonetheless for now he is on the [place] which is definitely something that we need to get going I am also can see about a referral back to infectious disease to see what they have to say in regard to what is going on currently as well. He voiced understanding and is in agreement with that plan. I did explain this is a very touchy go situation which hinges on [place] getting this  healed and the infection improved if not he is looking at amputation. Obviously this is a limb salvage Plan at this point. 02/25/2021 upon evaluation today patient's wound bed actually showed signs of being much drier and much less macerated compared to last week. We had a wound VAC on hold for this week. With that being said based on what I am seeing I think we probably just want to cancel the wound VAC altogether. I do not think it was doing enough for him to warrant the maceration and risks that are associated. He is on the [place] at this point. He also has an infection disease appointment next week. We will see what they have to say at this juncture. Nonetheless currently he is wrapping up his first round of hyperbaric oxygen therapy we could consider  repeat although the biggest issue right now is simply that he actually is having surgery on his limbs due to cataract issues that is tomorrow. Following that surgery I am not certain he really needs to be in the chamber for some period of time we need to see what his surgeon says. 03/04/2021 upon evaluation today patient's foot actually does appear to be doing better which is great news. I do not see any signs of infection he has been tolerating the [place] though he is done with that currently I think we probably need to reopen this for a little bit longer to ensure that he is completely treated. He is in agreement with this plan. Subsequently I will send that into the pharmacy for him today. 03/11/2021 upon evaluation today patient's wounds on the foot actually appear to still be doing about the same. I do not see any evidence of significant improvement which is not good although it is also not significantly worse which is good. It overall he is going to start back on hyperbarics this coming Monday. Obviously I think that can be beneficial for him as well and is going to likely help out with getting this hopefully  close. Objective Constitutional Well-nourished and well-hydrated in no acute distress. Vitals Time Taken: 7:49 AM, Height: 65 in, Weight: 196 lbs, BMI: 32.6, Temperature: 98.0 F, Pulse: 80 bpm, Respiratory Rate: 18 breaths/min, Blood Pressure: 132/92 mmHg, Capillary Blood Glucose: 155 mg/dl. Respiratory normal breathing without difficulty. Psychiatric this patient is able to make decisions and demonstrates good insight into disease process. Alert and Oriented x 3. pleasant and cooperative. General Notes: Upon inspection patient's wound bed actually showed signs of good granulation and epithelization at this point. There was still however depth to the wounds that unfortunately showed that he has probing down to bone especially from the lateral aspect of the plantar aspect I cannot really get down to the bone which is good news we been using endoform up to this point. Integumentary (Hair, Skin) Wound #3 status is Open. Original cause of wound was Gradually Appeared. The date acquired was: 10/29/2020. The wound has been in treatment 19 weeks. The wound is located on the Right,Proximal,Lateral Foot. The wound measures 1cm length x 0.4cm width x 0.5cm depth; 0.314cm^2 area and 0.157cm^3 volume. There is bone and Fat Layer (Subcutaneous Tissue) exposed. There is no undermining noted, however, there is tunneling at 7:00 with a maximum distance of 2cm. There is a medium amount of serosanguineous drainage noted. The wound margin is well defined and not attached to the wound base. There is large (67- 100%) red granulation within the wound bed. There is no necrotic tissue within the wound bed. Wound #7 status is Open. Original cause of wound was Gradually Appeared. The date acquired was: 12/31/2020. The wound has been in treatment 10 weeks. The wound is located on the [place]. The wound measures 0.3cm length x 0.3cm width x 2cm depth; 0.071cm^2 area and 0.141cm^3 volume. There is bone and  Fat Layer (Subcutaneous Tissue) exposed. There is no tunneling or undermining noted. There is a medium amount of serosanguineous drainage noted. The wound margin is well defined and not attached to the wound base. There is large (67-100%) red granulation within the wound bed. There is no necrotic tissue within the wound bed. Assessment Active Problems ICD-10 Other chronic osteomyelitis, right ankle and foot Type 2 diabetes mellitus with foot ulcer Non-pressure chronic ulcer of other part of right foot with necrosis of bone Disruption  of external operation (surgical) wound, not elsewhere classified, sequela Nicotine dependence, cigarettes, with other nicotine-induced disorders Non-pressure chronic ulcer of other part of left foot with fat layer exposed Corns and callosities Procedures A Paring/cutting 1 benign hyperkeratotic lesion procedure was performed. by Worthy Keeler, PA. Post procedure Diagnosis Wound #: Same as Pre-Procedure Notes: parring of callous to right foot amputation site. Plan Follow-up Appointments: Return Appointment in 1 week. Margarita Grizzle Start hyberbaric Monday 03/16/2021 Nurse Visit: - Mondays and Fridays Bathing/ Shower/ Hygiene: May shower with protection but do not get wound dressing(s) wet. Edema Control - Lymphedema / SCD / Other: Elevate legs to the level of the heart or above for 30 minutes daily and/or when sitting, a frequency of: Avoid standing for long periods of time. Moisturize legs daily. - to foot with dressing changes Off-Loading: Open toe surgical shoe to: - Wear at all times except driving. on right foot Other: - may wear shoe with memory foam insert to left foot, cut hole where callous is and insert in shoe to help offload Additional Orders / Instructions: Stop/Decrease Smoking Follow Nutritious Diet Non Wound Condition: Other Non Wound Condition Orders/Instructions: - cushion bottom of left foot with orthopedic felt or foam Hyperbaric Oxygen  Therapy: Evaluate for HBO Therapy Indication: - wagner grade 3 diabetic foot ulcer of right foot If appropriate for treatment, begin HBOT per protocol: 2.5 ATA for 90 Minutes with 2 Five (5) Minute Air Breaks T Number of Treatments: - additional 20 otal One treatments per day (delivered Monday through Friday unless otherwise specified in Special Instructions below): Finger stick Blood Glucose Pre- and Post- HBOT Treatment. Follow Hyperbaric Oxygen Glycemia Protocol Afrin (Oxymetazoline HCL) 0.05% nasal spray - 1 spray in both nostrils daily as needed prior to HBO treatment for difficulty clearing ears WOUND #3: - Foot Wound Laterality: Right, Lateral, Proximal Cleanser: Soap and Water 3 x Per Week/30 Days Discharge Instructions: May shower and wash wound with dial antibacterial soap and water prior to dressing change. Cleanser: Wound Cleanser 3 x Per Week/30 Days Discharge Instructions: Cleanse the wound with wound cleanser prior to applying a clean dressing using gauze sponges, not tissue or cotton balls. Peri-Wound Care: Sween Lotion (Moisturizing lotion) 3 x Per Week/30 Days Discharge Instructions: Apply moisturizing lotion to foot with dressing changes Prim Dressing: Endoform 2x2 in 3 x Per Week/30 Days ary Discharge Instructions: Lightly pack into wound bed. Secondary Dressing: Woven Gauze Sponge, Non-Sterile 4x4 in 3 x Per Week/30 Days Discharge Instructions: Apply over primary dressing as directed. Secondary Dressing: ABD Pad, 5x9 3 x Per Week/30 Days Discharge Instructions: Apply over primary dressing as directed. Com pression Wrap: Kerlix Roll 4.5x3.1 (in/yd) 3 x Per Week/30 Days Discharge Instructions: Apply Kerlix and Coban compression as directed. Com pression Wrap: Coban Self-Adherent Wrap 4x5 (in/yd) 3 x Per Week/30 Days Discharge Instructions: Apply over Kerlix as directed. WOUND #7: - Foot Wound Laterality: Plantar, Right, Lateral Cleanser: Soap and Water 3 x Per Week/30  Days Discharge Instructions: May shower and wash wound with dial antibacterial soap and water prior to dressing change. Cleanser: Wound Cleanser 3 x Per Week/30 Days Discharge Instructions: Cleanse the wound with wound cleanser prior to applying a clean dressing using gauze sponges, not tissue or cotton balls. Peri-Wound Care: Sween Lotion (Moisturizing lotion) 3 x Per Week/30 Days Discharge Instructions: Apply moisturizing lotion to foot with dressing changes Prim Dressing: Endoform 2x2 in 3 x Per Week/30 Days ary Discharge Instructions: Lightly pack into wound bed. Secondary Dressing:  Woven Gauze Sponge, Non-Sterile 4x4 in 3 x Per Week/30 Days Discharge Instructions: Apply over primary dressing as directed. Secondary Dressing: ABD Pad, 5x9 3 x Per Week/30 Days Discharge Instructions: Apply over primary dressing as directed. Com pression Wrap: Kerlix Roll 4.5x3.1 (in/yd) 3 x Per Week/30 Days Discharge Instructions: Apply Kerlix and Coban compression as directed. Com pression Wrap: Coban Self-Adherent Wrap 4x5 (in/yd) 3 x Per Week/30 Days Discharge Instructions: Apply over Kerlix as directed. 1. Would recommend currently that we have the patient continue with the Nuzyra I have refilled that for him last week. 2. Also can recommend that it would be appropriate and good thing for [place] to go ahead and we initiate hyperbaric oxygen therapy working to do that as well for an additional 20 visits that we will start Monday. 3. I am also going to suggest the patient continue with appropriate offloading is much as he can. Obviously the more this he can do the better in my opinion. We will see patient back for reevaluation in 1 week here in the clinic. If anything worsens or changes patient will contact our office for additional recommendations. Electronic Signature(s) Signed: 03/11/2021 1:20:00 PM By: Worthy Keeler PA-C Entered By: Worthy Keeler on 03/11/2021  13:20:00 -------------------------------------------------------------------------------- SuperBill Details Patient Name: Date of Service: [name], [name] 03/11/2021 Medical Record Number: [id number] Patient Account Number: [id number] Date of Birth/Sex: Treating RN: [birth year] (50 y.o. [name] Primary Care Provider: [name] Other Clinician: Referring Provider: Treating Provider/Extender: [name], [name] in Treatment: 26 Diagnosis Coding ICD-10 Codes Code Description [phone] Other chronic osteomyelitis, right ankle and foot E11.621 Type 2 diabetes mellitus with foot ulcer L97.514 Non-pressure chronic ulcer of other part of right foot with necrosis of bone T81.31XS Disruption of external operation (surgical) wound, not elsewhere classified, sequela F17.218 Nicotine dependence, cigarettes, with other nicotine-induced disorders L97.522 Non-pressure chronic ulcer of other part of left foot with fat layer exposed L84 Corns and callosities Facility Procedures CPT4 Code: [id number] Description: 63335 - PARE BENIGN LES; SGL ICD-10 Diagnosis Description L84 Corns and callosities Modifier: Quantity: 1 Physician Procedures : CPT4 Code Description Modifier [id number] - WC PHYS LEVEL 4 - EST PT 25 ICD-10 Diagnosis Description M86.671 Other chronic osteomyelitis, right ankle and foot E11.621 Type 2 diabetes mellitus with foot ulcer L97.514 Non-pressure chronic ulcer of  other part of right foot with necrosis of bone T81.31XS Disruption of external operation (surgical) wound, not elsewhere classified, sequela Quantity: 1 : [id number] - WC PHYS PARE BENIGN LES; SGL ICD-10 Diagnosis Description L84 Corns and callosities Quantity: 1 Electronic Signature(s) Signed: 03/11/2021 1:20:26 PM By: Worthy Keeler PA-C Entered By: Worthy Keeler on 03/11/2021 13:20:24

## 2021-03-11 NOTE — Progress Notes (Signed)
MAYKEL, REITTER ([id number]) Visit Report for 03/11/2021 Arrival Information Details Patient Name: Date of Service: [address], [name] 03/11/2021 7:30 A M Medical Record Number: [id number] Patient Account Number: [id number] Date of Birth/Sex: Treating RN: April 02, 1970 (50 y.o. [name], [name] Primary Care Jancarlo Biermann: Minette Brine, [place] Other Clinician: Referring Danthony Kendrix: Treating Kaycen Whitworth/Extender: [name], [name] in Treatment: 26 Visit Information History Since Last Visit Added or deleted any medications: No Patient Arrived: Cane Any new allergies or adverse reactions: No Arrival Time: 07:48 Had a fall or experienced change in No Accompanied By: self activities of daily living that may affect Transfer Assistance: None risk of falls: Patient Identification Verified: Yes Signs or symptoms of abuse/neglect since last visito No Secondary Verification Process Completed: Yes Hospitalized since last visit: No Patient Requires Transmission-Based Precautions: No Implantable device outside of the clinic excluding No Patient Has Alerts: No cellular tissue based products placed in the center since last visit: Has Dressing in Place as Prescribed: Yes Pain Present Now: No Electronic Signature(s) Signed: 03/11/2021 3:58:17 PM By: Sandre Kitty Entered By: Sandre Kitty on 03/11/2021 07:49:18 -------------------------------------------------------------------------------- Encounter Discharge Information Details Patient Name: Date of Service: [name], [place]. 03/11/2021 7:30 A M Medical Record Number: [id number] Patient Account Number: [id number] Date of Birth/Sex: Treating RN: 1970-05-05 (50 y.o. [name] Primary Care Ciena Sampley: [name] Other Clinician: Referring Leonard Hendler: Treating Sebrena Engh/Extender: [name], [name] in Treatment: 26 Encounter Discharge Information Items Discharge Condition: Stable Ambulatory Status:  Cane Discharge Destination: Home Transportation: Private Auto Accompanied By: self Schedule Follow-up Appointment: Yes Clinical Summary of Care: Electronic Signature(s) Signed: 03/11/2021 5:27:53 PM By: [name] RN, BSN Entered By: [name] on 03/11/2021 16:27:35 -------------------------------------------------------------------------------- Lower Extremity Assessment Details Patient Name: Date of Service: [name], [place]. 03/11/2021 7:30 A M Medical Record Number: [id number] Patient Account Number: [id number] Date of Birth/Sex: Treating RN: 11/12/1970 (50 y.o. [name] Primary Care Iretta Mangrum: [name] Other Clinician: Referring Ernesto Lashway: Treating Trenace Coughlin/Extender: [name], [name] in Treatment: 26 Edema Assessment Assessed: [Left: No] [Right: Yes] Edema: [Left: Yes] [Right: Yes] Calf Left: Right: Point of Measurement: From Medial Instep 34 cm Ankle Left: Right: Point of Measurement: From Medial Instep 24 cm Vascular Assessment Pulses: Dorsalis Pedis Palpable: [Right:Yes] Electronic Signature(s) Signed: 03/11/2021 5:27:53 PM By: [name] RN, BSN Entered By: [name] on 03/11/2021 07:51:37 -------------------------------------------------------------------------------- [place] Details Patient Name: Date of Service: [name], [place] 03/11/2021 7:30 A M Medical Record Number: [id number] Patient Account Number: [id number] Date of Birth/Sex: Treating RN: 1970-12-19 (50 y.o. [name] Primary Care Tasheena Wambolt: [name] Other Clinician: Referring Mabel Roll: Treating Gladie Gravette/Extender: [name], [name] in Treatment: 26 Active Inactive Osteomyelitis Nursing Diagnoses: Infection: osteomyelitis Knowledge deficit related to disease process and management Goals: Patient/caregiver will verbalize understanding of disease process and disease management Date Initiated:  11/26/2020 Target Resolution Date: 03/25/2021 Goal Status: Active Patient's osteomyelitis will resolve Date Initiated: 11/26/2020 Target Resolution Date: 03/25/2021 Goal Status: Active Interventions: Assess for signs and symptoms of osteomyelitis resolution every visit Provide education on osteomyelitis Screen for HBO Treatment Activities: Systemic antibiotics : 11/26/2020 Notes: 02/25/21 continuiing treatment, ID appointment next week Wound/Skin Impairment Nursing Diagnoses: Impaired tissue integrity Goals: Patient/caregiver will verbalize understanding of skin care regimen Date Initiated: 09/05/2020 Target Resolution Date: 03/25/2021 Goal Status: Active Ulcer/skin breakdown will have a volume reduction of 30% by week 4 Date Initiated: 09/05/2020 Date Inactivated: 10/08/2020 Target Resolution Date:  10/03/2020 Goal Status: Met Interventions: Assess patient/caregiver ability to obtain necessary supplies Assess patient/caregiver ability to perform ulcer/skin care regimen upon admission and as needed Assess ulceration(s) every visit Provide education on ulcer and skin care Treatment Activities: Patient referred to home care : 09/05/2020 Skin care regimen initiated : 09/05/2020 Topical wound management initiated : 09/05/2020 Notes: Electronic Signature(s) Signed: 03/11/2021 5:27:53 PM By: [name] RN, BSN Entered By: [name] on 03/11/2021 08:20:46 -------------------------------------------------------------------------------- Pain Assessment Details Patient Name: Date of Service: [name], [place] 03/11/2021 7:30 A M Medical Record Number: [id number] Patient Account Number: [id number] Date of Birth/Sex: Treating RN: 1970/10/15 (50 y.o. [name] Primary Care Melea Prezioso: [name] Other Clinician: Referring Xzavion Doswell: Treating Kaidyn Hernandes/Extender: [name], [name] in Treatment: 26 Active Problems Location of Pain Severity and  Description of Pain Patient Has Paino No Site Locations Pain Management and Medication Current Pain Management: Electronic Signature(s) Signed: 03/11/2021 3:58:17 PM By: Sandre Kitty Signed: 03/11/2021 4:24:09 PM By: Baruch Gouty RN, BSN Entered By: Sandre Kitty on 03/11/2021 07:50:03 -------------------------------------------------------------------------------- Patient/Caregiver Education Details Patient Name: Date of Service: [name], [place] 12/14/2022andnbsp7:30 A M Medical Record Number: [id number] Patient Account Number: [id number] Date of Birth/Gender: Treating RN: 08-Feb-1971 (50 y.o. [name] Primary Care Physician: [name] Other Clinician: Referring Physician: Treating Physician/Extender: [name], [name] in Treatment: 26 Education Assessment Education Provided To: Patient Education Topics Provided Wound/Skin Impairment: Handouts: Skin Care Do's and Dont's Methods: Explain/Verbal Responses: Reinforcements needed Electronic Signature(s) Signed: 03/11/2021 5:27:53 PM By: [name] RN, BSN Entered By: [name] on 03/11/2021 08:22:17 -------------------------------------------------------------------------------- Wound Assessment Details Patient Name: Date of Service: [name], [place] 03/11/2021 7:30 A M Medical Record Number: [id number] Patient Account Number: [id number] Date of Birth/Sex: Treating RN: 11/09/1970 (50 y.o. [name] Primary Care Shantelle Alles: [name] Other Clinician: Referring Neysa Arts: Treating Latoia Eyster/Extender: [name], [name] in Treatment: 26 Wound Status Wound Number: 3 Primary Diabetic Wound/Ulcer of the Lower Extremity Etiology: Wound Location: Right, Proximal, Lateral Foot Wound Open Wounding Event: Gradually Appeared Status: Date Acquired: 10/29/2020 Comorbid Anemia, Coronary Artery Disease, Hypertension, Type II Weeks Of Treatment:  19 History: Diabetes, Osteomyelitis, Neuropathy Clustered Wound: No Wound Measurements Length: (cm) 1 Width: (cm) 0.4 Depth: (cm) 0.5 Area: (cm) 0.314 Volume: (cm) 0.157 % Reduction in Area: -149.2% % Reduction in Volume: -78.4% Epithelialization: Medium (34-66%) Tunneling: Yes Position (o'clock): 7 Maximum Distance: (cm) 2 Undermining: No Wound Description Classification: Grade 3 Wound Margin: Well defined, not attached Exudate Amount: Medium Exudate Type: Serosanguineous Exudate Color: red, brown Foul Odor After Cleansing: No Slough/Fibrino No Wound Bed Granulation Amount: Large (67-100%) Exposed Structure Granulation Quality: Red Fascia Exposed: No Necrotic Amount: None Present (0%) Fat Layer (Subcutaneous Tissue) Exposed: Yes Tendon Exposed: No Muscle Exposed: No Joint Exposed: No Bone Exposed: Yes Treatment Notes Wound #3 (Foot) Wound Laterality: Right, Lateral, Proximal Cleanser Soap and Water Discharge Instruction: May shower and wash wound with dial antibacterial soap and water prior to dressing change. Wound Cleanser Discharge Instruction: Cleanse the wound with wound cleanser prior to applying a clean dressing using gauze sponges, not tissue or cotton balls. Peri-Wound Care Sween Lotion (Moisturizing lotion) Discharge Instruction: Apply moisturizing lotion to foot with dressing changes Topical Primary Dressing Endoform 2x2 in Discharge Instruction: Lightly pack into wound bed. Secondary Dressing Woven Gauze Sponge, Non-Sterile 4x4 in Discharge Instruction: Apply over primary dressing as directed. ABD Pad, 5x9 Discharge Instruction: Apply over primary dressing as  directed. Secured With Compression Wrap Kerlix Roll 4.5x3.1 (in/yd) Discharge Instruction: Apply Kerlix and Coban compression as directed. Coban Self-Adherent Wrap 4x5 (in/yd) Discharge Instruction: Apply over Kerlix as directed. Compression Stockings Add-Ons Electronic  Signature(s) Signed: 03/11/2021 5:27:53 PM By: [name] RN, BSN Entered By: [name] on 03/11/2021 07:58:52 -------------------------------------------------------------------------------- Wound Assessment Details Patient Name: Date of Service: [name], [name] 03/11/2021 7:30 A M Medical Record Number: [id number] Patient Account Number: [id number] Date of Birth/Sex: Treating RN: 1970-05-23 (50 y.o. [name] Primary Care Shaune Malacara: [name] Other Clinician: Referring Keliah Harned: Treating Nathian Stencil/Extender: [name], [name] in Treatment: 26 Wound Status Wound Number: 7 Primary Diabetic Wound/Ulcer of the Lower Extremity Etiology: Wound Location: Right, Lateral, Plantar Foot Wound Open Wounding Event: Gradually Appeared Status: Date Acquired: 12/31/2020 Comorbid Anemia, Coronary Artery Disease, Hypertension, Type II Weeks Of Treatment: 10 History: Diabetes, Osteomyelitis, Neuropathy Clustered Wound: No Wound Measurements Length: (cm) 0.3 Width: (cm) 0.3 Depth: (cm) 2 Area: (cm) 0.071 Volume: (cm) 0.141 % Reduction in Area: 63.8% % Reduction in Volume: 28.1% Epithelialization: Small (1-33%) Tunneling: No Undermining: No Wound Description Classification: Grade 3 Wound Margin: Well defined, not attached Exudate Amount: Medium Exudate Type: Serosanguineous Exudate Color: red, brown Foul Odor After Cleansing: No Slough/Fibrino No Wound Bed Granulation Amount: Large (67-100%) Exposed Structure Granulation Quality: Red Fascia Exposed: No Necrotic Amount: None Present (0%) Fat Layer (Subcutaneous Tissue) Exposed: Yes Tendon Exposed: No Muscle Exposed: No Joint Exposed: No Bone Exposed: Yes Treatment Notes Wound #7 (Foot) Wound Laterality: Plantar, Right, Lateral Cleanser Soap and Water Discharge Instruction: May shower and wash wound with dial antibacterial soap and water prior to dressing change. Wound  Cleanser Discharge Instruction: Cleanse the wound with wound cleanser prior to applying a clean dressing using gauze sponges, not tissue or cotton balls. Peri-Wound Care Sween Lotion (Moisturizing lotion) Discharge Instruction: Apply moisturizing lotion to foot with dressing changes Topical Primary Dressing Endoform 2x2 in Discharge Instruction: Lightly pack into wound bed. Secondary Dressing Woven Gauze Sponge, Non-Sterile 4x4 in Discharge Instruction: Apply over primary dressing as directed. ABD Pad, 5x9 Discharge Instruction: Apply over primary dressing as directed. Secured With Compression Wrap Kerlix Roll 4.5x3.1 (in/yd) Discharge Instruction: Apply Kerlix and Coban compression as directed. Coban Self-Adherent Wrap 4x5 (in/yd) Discharge Instruction: Apply over Kerlix as directed. Compression Stockings Add-Ons Electronic Signature(s) Signed: 03/11/2021 5:27:53 PM By: [name] RN, BSN Entered By: [name] on 03/11/2021 07:59:18 -------------------------------------------------------------------------------- [place] Details Patient Name: Date of Service: [name], [name] 03/11/2021 7:30 A M Medical Record Number: [id number] Patient Account Number: [id number] Date of Birth/Sex: Treating RN: 1970-11-23 (50 y.o. [name] Primary Care Hiedi Touchton: [name] Other Clinician: Referring Gedalya Jim: Treating Frannie Shedrick/Extender: [name], [name] in Treatment: 26 Vital Signs Time Taken: 07:49 Temperature (F): 98.0 Height (in): 65 Pulse (bpm): 80 Weight (lbs): 196 Respiratory Rate (breaths/min): 18 Body Mass Index (BMI): 32.6 Blood Pressure (mmHg): 132/92 Capillary Blood Glucose (mg/dl): 155 Reference Range: 80 - 120 mg / dl Electronic Signature(s) Signed: 03/11/2021 3:58:17 PM By: Sandre Kitty Entered By: Sandre Kitty on 03/11/2021 07:49:56

## 2021-03-13 ENCOUNTER — Other Ambulatory Visit: Payer: Self-pay

## 2021-03-13 ENCOUNTER — Encounter (HOSPITAL_BASED_OUTPATIENT_CLINIC_OR_DEPARTMENT_OTHER): Payer: 59 | Admitting: Internal Medicine

## 2021-03-13 DIAGNOSIS — E1169 Type 2 diabetes mellitus with other specified complication: Secondary | ICD-10-CM | POA: Diagnosis not present

## 2021-03-13 NOTE — Progress Notes (Signed)
JAHARI, BILLY ([id number]) Visit Report for 03/13/2021 [place] Details Patient Name: Date of Service: [place] [address], [name] 03/13/2021 Medical Record Number: [id number] Patient Account Number: [id number] Date of Birth/Sex: Treating RN: 05-18-1970 (51 y.o. [name], [name] Primary Care Provider: [name] Other Clinician: Referring Provider: Treating Provider/Extender: [name], [name] in Treatment: 27 Diagnosis Coding ICD-10 Codes Code Description [phone] Other chronic osteomyelitis, right ankle and foot E11.621 Type 2 diabetes mellitus with foot ulcer L97.514 Non-pressure chronic ulcer of other part of right foot with necrosis of bone T81.31XS Disruption of external operation (surgical) wound, not elsewhere classified, sequela F17.218 Nicotine dependence, cigarettes, with other nicotine-induced disorders L97.522 Non-pressure chronic ulcer of other part of left foot with fat layer exposed L84 Corns and callosities Facility Procedures CPT4 Code Description Modifier Quantity [id number] 99213 - WOUND CARE VISIT-LEV 3 EST PT 1 Electronic Signature(s) Signed: 03/13/2021 9:46:46 AM By: Kalman Shan DO Signed: 03/13/2021 11:40:20 AM By: Rhae Hammock RN Entered By: Rhae Hammock on 03/13/2021 09:11:01

## 2021-03-16 ENCOUNTER — Other Ambulatory Visit: Payer: Self-pay

## 2021-03-16 ENCOUNTER — Encounter (HOSPITAL_BASED_OUTPATIENT_CLINIC_OR_DEPARTMENT_OTHER): Payer: 59 | Admitting: Internal Medicine

## 2021-03-16 ENCOUNTER — Other Ambulatory Visit: Payer: Self-pay | Admitting: Nurse Practitioner

## 2021-03-16 DIAGNOSIS — I10 Essential (primary) hypertension: Secondary | ICD-10-CM

## 2021-03-16 DIAGNOSIS — M86671 Other chronic osteomyelitis, right ankle and foot: Secondary | ICD-10-CM | POA: Diagnosis not present

## 2021-03-16 DIAGNOSIS — E785 Hyperlipidemia, unspecified: Secondary | ICD-10-CM

## 2021-03-16 DIAGNOSIS — E1169 Type 2 diabetes mellitus with other specified complication: Secondary | ICD-10-CM | POA: Diagnosis not present

## 2021-03-16 DIAGNOSIS — E11621 Type 2 diabetes mellitus with foot ulcer: Secondary | ICD-10-CM | POA: Diagnosis not present

## 2021-03-16 DIAGNOSIS — R21 Rash and other nonspecific skin eruption: Secondary | ICD-10-CM

## 2021-03-16 DIAGNOSIS — E1142 Type 2 diabetes mellitus with diabetic polyneuropathy: Secondary | ICD-10-CM

## 2021-03-16 LAB — GLUCOSE, CAPILLARY
Glucose-Capillary: 209 mg/dL — ABNORMAL HIGH (ref 70–99)
Glucose-Capillary: 137 mg/dL — ABNORMAL HIGH (ref 70–99)

## 2021-03-16 MED ORDER — INSULIN PEN NEEDLE 32G X 4 MM MISC: 0 refills | Status: AC

## 2021-03-16 MED ORDER — ATORVASTATIN CALCIUM 20 MG PO TABS: 20.0000 mg | ORAL_TABLET | Freq: Every day | ORAL | 1 refills | Status: DC

## 2021-03-16 MED ORDER — CARVEDILOL 25 MG PO TABS: 25.0000 mg | ORAL_TABLET | Freq: Two times a day (BID) | ORAL | 1 refills | Status: DC

## 2021-03-16 MED ORDER — TRIAMCINOLONE ACETONIDE 0.1 % EX CREA: 1.0000 "application " | TOPICAL_CREAM | Freq: Two times a day (BID) | CUTANEOUS | 0 refills | Status: AC

## 2021-03-16 NOTE — Progress Notes (Signed)
[name], [name] ([id number]) Visit Report for 03/16/2021 Super[place] Details Patient Name: Date of Service: [place] [address] [place] [address], [name] 03/16/2021 Medical Record Number: [id number] Patient Account Number: [id number] Date of Birth/Sex: Treating RN: 04/29/1970 (50 y.o. [name] Primary Care Provider: [name] Other Clinician: [name] Referring Provider: Treating Provider/Extender: [name], [name] in Treatment: 27 Diagnosis Coding ICD-10 Codes Code Description [phone] Other chronic osteomyelitis, right ankle and foot E11.621 Type 2 diabetes mellitus with foot ulcer L97.514 Non-pressure chronic ulcer of other part of right foot with necrosis of bone T81.31XS Disruption of external operation (surgical) wound, not elsewhere classified, sequela F17.218 Nicotine dependence, cigarettes, with other nicotine-induced disorders L97.522 Non-pressure chronic ulcer of other part of left foot with fat layer exposed L84 Corns and callosities Facility Procedures CPT4 Code Description Modifier Quantity [id number]-(Facility Use Only) HBOT full body chamber, 95min , 5 ICD-10 Diagnosis Description E11.621 Type 2 diabetes mellitus with foot ulcer M86.671 Other chronic osteomyelitis, right ankle and foot Physician Procedures Quantity CPT4 Code Description Modifier [id number] - WC PHYS HYPERBARIC OXYGEN THERAPY 1 ICD-10 Diagnosis Description E11.621 Type 2 diabetes mellitus with foot ulcer M86.671 Other chronic osteomyelitis, right ankle and foot Electronic Signature(s) Signed: 03/16/2021 1:55:41 PM By: [name] [name] , , Signed: 03/16/2021 3:54:51 PM By: Kalman Shan DO Previous Signature: 03/16/2021 1:38:15 PM Version By: [name] [name] , , Entered By: [name] on 03/16/2021 13:55:38

## 2021-03-16 NOTE — Progress Notes (Signed)
[name], [name] ([id number]) Visit Report for 03/13/2021 Arrival Information Details Patient Name: Date of Service: [name] RD, [name] 03/13/2021 9:00 A M Medical Record Number: [id number] Patient Account Number: [id number] Date of Birth/Sex: Treating RN: May 01, 1970 (50 y.o. [name], [name] Primary Care [name]: Minette Brine, [place] Other Clinician: Referring [name]: Treating [name]/Extender: [name], [name] in Treatment: 27 Visit Information History Since Last Visit Added or deleted any medications: No Patient Arrived: [name] Any new allergies or adverse reactions: No Arrival Time: 08:46 Had a fall or experienced change in No Accompanied By: self activities of daily living that may affect Transfer Assistance: None risk of falls: Patient Identification Verified: Yes Signs or symptoms of abuse/neglect since last visito No Secondary Verification Process Completed: Yes Hospitalized since last visit: No Patient Requires Transmission-Based Precautions: No Implantable device outside of the clinic excluding No Patient Has Alerts: No cellular tissue based products placed in the center since last visit: Has Dressing in Place as Prescribed: Yes Pain Present Now: No Electronic Signature(s) Signed: 03/13/2021 8:55:23 AM By: Sandre Kitty Entered By: Sandre Kitty on 03/13/2021 08:46:39 -------------------------------------------------------------------------------- Clinic Level of Care Assessment Details Patient Name: Date of Service: [name], [name] 03/13/2021 9:00 A M Medical Record Number: [id number] Patient Account Number: [id number] Date of Birth/Sex: Treating RN: 1970-09-09 (50 y.o. [name], [name] Primary Care [name]: [name] Other Clinician: Referring Haleem Hanner: Treating Loreley Schwall/Extender: [name], [name] in Treatment: 27 Clinic Level of Care Assessment Items TOOL 4 Quantity Score X- 1 0 Use when only an  EandM is performed on FOLLOW-UP visit ASSESSMENTS - Nursing Assessment / Reassessment X- 1 10 Reassessment of Co-morbidities (includes updates in patient status) X- 1 5 Reassessment of Adherence to Treatment Plan ASSESSMENTS - Wound and Skin A ssessment / Reassessment X - Simple Wound Assessment / Reassessment - one wound 1 5 []  - 0 Complex Wound Assessment / Reassessment - multiple wounds []  - 0 Dermatologic / Skin Assessment (not related to wound area) ASSESSMENTS - Focused Assessment []  - 0 Circumferential Edema Measurements - multi extremities []  - 0 Nutritional Assessment / Counseling / Intervention []  - 0 Lower Extremity Assessment (monofilament, tuning fork, pulses) []  - 0 Peripheral Arterial Disease Assessment (using hand held doppler) ASSESSMENTS - Ostomy and/or Continence Assessment and Care []  - 0 Incontinence Assessment and Management []  - 0 Ostomy Care Assessment and Management (repouching, etc.) PROCESS - Coordination of Care X - Simple Patient / Family Education for ongoing care 1 15 []  - 0 Complex (extensive) Patient / Family Education for ongoing care X- 1 10 Staff obtains Programmer, systems, Records, T Results / Process Orders est []  - 0 Staff telephones HHA, Nursing Homes / Clarify orders / etc []  - 0 Routine Transfer to another Facility (non-emergent condition) []  - 0 Routine Hospital Admission (non-emergent condition) []  - 0 New Admissions / Biomedical engineer / Ordering NPWT Apligraf, etc. , []  - 0 Emergency Hospital Admission (emergent condition) X- 1 10 Simple Discharge Coordination []  - 0 Complex (extensive) Discharge Coordination PROCESS - Special Needs []  - 0 Pediatric / Minor Patient Management []  - 0 Isolation Patient Management []  - 0 Hearing / Language / Visual special needs []  - 0 Assessment of Community assistance (transportation, D/C planning, etc.) []  - 0 Additional assistance / Altered mentation []  - 0 Support Surface(s)  Assessment (bed, cushion, seat, etc.) INTERVENTIONS - Wound Cleansing / Measurement X - Simple Wound Cleansing - one wound 1 5 []  - 0 Complex Wound Cleansing -  multiple wounds X- 1 5 Wound Imaging (photographs - any number of wounds) []  - 0 Wound Tracing (instead of photographs) X- 1 5 Simple Wound Measurement - one wound []  - 0 Complex Wound Measurement - multiple wounds INTERVENTIONS - Wound Dressings X - Small Wound Dressing one or multiple wounds 1 10 []  - 0 Medium Wound Dressing one or multiple wounds []  - 0 Large Wound Dressing one or multiple wounds []  - 0 Application of Medications - topical []  - 0 Application of Medications - injection INTERVENTIONS - Miscellaneous []  - 0 External ear exam []  - 0 Specimen Collection (cultures, biopsies, blood, body fluids, etc.) []  - 0 Specimen(s) / Culture(s) sent or taken to Lab for analysis []  - 0 Patient Transfer (multiple staff / Civil Service fast streamer / Similar devices) []  - 0 Simple Staple / Suture removal (25 or less) []  - 0 Complex Staple / Suture removal (26 or more) []  - 0 Hypo / Hyperglycemic Management (close monitor of Blood Glucose) []  - 0 Ankle / Brachial Index (ABI) - do not check if billed separately X- 1 5 Vital Signs Has the patient been seen at the hospital within the last three years: Yes Total Score: 85 Level Of Care: New/Established - Level 3 Electronic Signature(s) Signed: 03/13/2021 11:40:20 AM By: Rhae Hammock RN Entered By: Rhae Hammock on 03/13/2021 09:10:54 -------------------------------------------------------------------------------- Encounter Discharge Information Details Patient Name: Date of Service: [name], [name] 03/13/2021 9:00 A M Medical Record Number: [id number] Patient Account Number: [id number] Date of Birth/Sex: Treating RN: [birth year] (50 y.o. [name], [name] Primary Care Tauheed Mcfayden: [name] Other Clinician: Referring Haizlee Henton: Treating Amyia Lodwick/Extender:  [name], [name] in Treatment: 27 Encounter Discharge Information Items Discharge Condition: Stable Ambulatory Status: Ambulatory Discharge Destination: Home Transportation: Private Auto Accompanied By: self Schedule Follow-up Appointment: Yes Clinical Summary of Care: Patient Declined Electronic Signature(s) Signed: 03/13/2021 11:40:20 AM By: Rhae Hammock RN Entered By: Rhae Hammock on 03/13/2021 09:10:14 -------------------------------------------------------------------------------- Patient/Caregiver Education Details Patient Name: Date of Service: [name], [name] 12/16/2022andnbsp9:00 A M Medical Record Number: [id number] Patient Account Number: [id number] Date of Birth/Gender: Treating RN: October 02, 1970 (50 y.o. Erie Noe Primary Care Physician: [name] Other Clinician: Referring Physician: Treating Physician/Extender: [name] Weeks in Treatment: 27 Education Assessment Education Provided To: Patient Education Topics Provided Wound/Skin Impairment: Methods: Explain/Verbal Electronic Signature(s) Signed: 03/13/2021 11:40:20 AM By: Rhae Hammock RN Entered By: Rhae Hammock on 03/13/2021 09:10:02 -------------------------------------------------------------------------------- Wound Assessment Details Patient Name: Date of Service: [name], [name] 03/13/2021 9:00 A M Medical Record Number: [id number] Patient Account Number: [id number] Date of Birth/Sex: Treating RN: 1971-01-08 (50 y.o. [name] Primary Care Shenea Giacobbe: [name] Other Clinician: Referring Moosa Bueche: Treating Elis Rawlinson/Extender: [name], [name] in Treatment: 27 Wound Status Wound Number: 3 Primary Etiology: Diabetic Wound/Ulcer of the Lower Extremity Wound Location: Right, Proximal, Lateral Foot Wound Status: Open Wounding Event: Gradually Appeared Date Acquired: 10/29/2020 Weeks Of  Treatment: 19 Clustered Wound: No Wound Measurements Length: (cm) 1 Width: (cm) 0.4 Depth: (cm) 0.5 Area: (cm) 0.314 Volume: (cm) 0.157 % Reduction in Area: -149.2% % Reduction in Volume: -78.4% Wound Description Classification: Grade 3 Exudate Amount: Medium Exudate Type: Serosanguineous Exudate Color: red, brown Treatment Notes Wound #3 (Foot) Wound Laterality: Right, Lateral, Proximal Cleanser Soap and Water Discharge Instruction: May shower and wash wound with dial antibacterial soap and water prior to dressing change. Wound Cleanser Discharge Instruction: Cleanse the wound with wound cleanser prior to applying a  clean dressing using gauze sponges, not tissue or cotton balls. Peri-Wound Care Sween Lotion (Moisturizing lotion) Discharge Instruction: Apply moisturizing lotion to foot with dressing changes Topical Primary Dressing Endoform 2x2 in Discharge Instruction: Lightly pack into wound bed. Secondary Dressing Woven Gauze Sponge, Non-Sterile 4x4 in Discharge Instruction: Apply over primary dressing as directed. ABD Pad, 5x9 Discharge Instruction: Apply over primary dressing as directed. Secured With Compression Wrap Kerlix Roll 4.5x3.1 (in/yd) Discharge Instruction: Apply Kerlix and Coban compression as directed. Coban Self-Adherent Wrap 4x5 (in/yd) Discharge Instruction: Apply over Kerlix as directed. Compression Stockings Add-Ons Electronic Signature(s) Signed: 03/13/2021 8:55:23 AM By: Sandre Kitty Signed: 03/16/2021 12:08:12 PM By: Baruch Gouty RN, BSN Entered By: Sandre Kitty on 03/13/2021 08:47:30 -------------------------------------------------------------------------------- Wound Assessment Details Patient Name: Date of Service: [name], [name] 03/13/2021 9:00 A M Medical Record Number: [id number] Patient Account Number: [id number] Date of Birth/Sex: Treating RN: 03/12/1971 (50 y.o. [name] Primary Care Leelynd Maldonado:  [name] Other Clinician: Referring Layce Sprung: Treating Larrisha Babineau/Extender: [name], [name] in Treatment: 27 Wound Status Wound Number: 7 Primary Etiology: Diabetic Wound/Ulcer of the Lower Extremity Wound Location: Right, Lateral, Plantar Foot Wound Status: Open Wounding Event: Gradually Appeared Date Acquired: 12/31/2020 Weeks Of Treatment: 10 Clustered Wound: No Wound Measurements Length: (cm) 0.3 Width: (cm) 0.3 Depth: (cm) 2 Area: (cm) 0.071 Volume: (cm) 0.141 % Reduction in Area: 63.8% % Reduction in Volume: 28.1% Wound Description Classification: Grade 3 Exudate Amount: Medium Exudate Type: Serosanguineous Exudate Color: red, brown Treatment Notes Wound #7 (Foot) Wound Laterality: Plantar, Right, Lateral Cleanser Soap and Water Discharge Instruction: May shower and wash wound with dial antibacterial soap and water prior to dressing change. Wound Cleanser Discharge Instruction: Cleanse the wound with wound cleanser prior to applying a clean dressing using gauze sponges, not tissue or cotton balls. Peri-Wound Care Sween Lotion (Moisturizing lotion) Discharge Instruction: Apply moisturizing lotion to foot with dressing changes Topical Primary Dressing Endoform 2x2 in Discharge Instruction: Lightly pack into wound bed. Secondary Dressing Woven Gauze Sponge, Non-Sterile 4x4 in Discharge Instruction: Apply over primary dressing as directed. ABD Pad, 5x9 Discharge Instruction: Apply over primary dressing as directed. Secured With Compression Wrap Kerlix Roll 4.5x3.1 (in/yd) Discharge Instruction: Apply Kerlix and Coban compression as directed. Coban Self-Adherent Wrap 4x5 (in/yd) Discharge Instruction: Apply over Kerlix as directed. Compression Stockings Add-Ons Electronic Signature(s) Signed: 03/13/2021 8:55:23 AM By: Sandre Kitty Signed: 03/16/2021 12:08:12 PM By: Baruch Gouty RN, BSN Entered By: Sandre Kitty on  03/13/2021 08:47:30 -------------------------------------------------------------------------------- Vitals Details Patient Name: Date of Service: [name], [name] 03/13/2021 9:00 A M Medical Record Number: [id number] Patient Account Number: [id number] Date of Birth/Sex: Treating RN: 06-17-1970 (50 y.o. [name] Primary Care Tashonna Descoteaux: [name] Other Clinician: Referring Derk Doubek: Treating Lakitha Gordy/Extender: [name], [name] in Treatment: 27 Vital Signs Time Taken: 08:46 Temperature (F): 97.9 Height (in): 65 Pulse (bpm): 112 Weight (lbs): 196 Respiratory Rate (breaths/min): 18 Body Mass Index (BMI): 32.6 Blood Pressure (mmHg): 184/89 Capillary Blood Glucose (mg/dl): 145 Reference Range: 80 - 120 mg / dl Electronic Signature(s) Signed: 03/13/2021 8:55:23 AM By: Sandre Kitty Entered By: Sandre Kitty on 03/13/2021 08:47:08

## 2021-03-16 NOTE — Progress Notes (Signed)
DERAY, DAWES ([id number]) Visit Report for 03/16/2021 Arrival Information Details Patient Name: Date of Service: [place] RD, [name] 03/16/2021 10:00 A M Medical Record Number: [id number] Patient Account Number: [id number] Date of Birth/Sex: Treating RN: [birth year] (50 y.o. [name] Primary Care Nakeyia Menden: [name] Other Clinician: [name] Referring Malikhi Ogan: Treating Myliyah Rebuck/Extender: [name], [name] in Treatment: 27 Visit Information History Since Last Visit All ordered tests and consults were completed: Yes Patient Arrived: Cane Added or deleted any medications: No Arrival Time: 09:43 Any new allergies or adverse reactions: No Accompanied By: self Had a fall or experienced change in No Transfer Assistance: None activities of daily living that may affect Patient Identification Verified: Yes risk of falls: Secondary Verification Process Completed: Yes Signs or symptoms of abuse/neglect since last visito No Patient Requires Transmission-Based Precautions: No Hospitalized since last visit: No Patient Has Alerts: No Implantable device outside of the clinic excluding No cellular tissue based products placed in the center since last visit: Pain Present Now: No Electronic Signature(s) Signed: 03/16/2021 1:39:28 PM By: [name] [name] , , Previous Signature: 03/16/2021 11:55:58 AM Version By: [name] [name] , , Entered By: [name] on 03/16/2021 13:39:28 -------------------------------------------------------------------------------- Encounter Discharge Information Details Patient Name: Date of Service: [name], [place]. 03/16/2021 10:00 A M Medical Record Number: [id number] Patient Account Number: [id number] Date of Birth/Sex: Treating RN: 1970/06/30 (50 y.o. [name] Primary Care Hitesh Fouche: [name] Other Clinician: [name] Referring Iva Posten: Treating  Reta Norgren/Extender: [name], [name] in Treatment: 27 Encounter Discharge Information Items Discharge Condition: Stable Ambulatory Status: Cane Discharge Destination: Home Transportation: Private Auto Accompanied By: self Schedule Follow-up Appointment: No Clinical Summary of Care: Electronic Signature(s) Signed: 03/16/2021 1:39:00 PM By: [name] [name] , , Entered By: [name] on 03/16/2021 13:39:00 -------------------------------------------------------------------------------- Scott Details Patient Name: Date of Service: [name], [name] 03/16/2021 10:00 A M Medical Record Number: [id number] Patient Account Number: [id number] Date of Birth/Sex: Treating RN: 31-Aug-1970 (50 y.o. [name] Primary Care Deonne Rooks: [name] Other Clinician: Valeria Batman Referring Brannon Levene: Treating Keyanna Sandefer/Extender: [name], [name] in Treatment: 27 Vital Signs Time Taken: 09:49 Temperature (F): 97.7 Height (in): 65 Pulse (bpm): 100 Weight (lbs): 196 Respiratory Rate (breaths/min): 16 Body Mass Index (BMI): 32.6 Blood Pressure (mmHg): 164/87 Capillary Blood Glucose (mg/dl): 209 Reference Range: 80 - 120 mg / dl Airway Pulse Oximetry (%): 100 Electronic Signature(s) Signed: 03/16/2021 11:56:47 AM By: [name] [name] , , Entered By: [name] on 03/16/2021 32:91:91

## 2021-03-16 NOTE — Progress Notes (Addendum)
[name], MISIASZEK ([id number]) Visit Report for 03/16/2021 HBO Details Patient Name: Date of Service: [place] RD, [name] 03/16/2021 10:00 A M Medical Record Number: [id number] Patient Account Number: [id number] Date of Birth/Sex: Treating RN: 11/13/1970 (50 y.o. [name] Primary Care Amaziah Ghosh: [name] Other Clinician: [name] Referring Tyjai Matuszak: Treating Jaidon Ellery/Extender: [name], [name] in Treatment: 27 HBO Treatment Course Details Treatment Course Number: 1 Ordering Adeliz Tonkinson: [name] Treatments Ordered: otal 60 HBO Treatment Start Date: 12/08/2020 HBO Indication: Diabetic Ulcer(s) of the Lower Extremity HBO Treatment Details Treatment Number: 40 Patient Type: Outpatient Chamber Type: Monoplace Chamber Serial #: [id number] Treatment Protocol: 2.5 ATA with 90 minutes oxygen, with two 5 minute air breaks Treatment Details Compression Rate Down: 1.5 psi / minute De-Compression Rate Up: 1.5 psi / minute A breaks and breathing ir Compress Tx Pressure periods Decompress Decompress Begins Reached (leave unused spaces Begins Ends blank) Chamber Pressure (ATA 1 2.5 2.5 2.5 2.5 2.5 - - 2.5 1 ) Clock Time (24 hr) 10:04 10:23 10:53 10:58 11:28 11:32 - - 12:03 12:28 Treatment Length: 144 (minutes) Treatment Segments: 5 Vital Signs Capillary Blood Glucose Reference Range: 80 - 120 mg / dl HBO Diabetic Blood Glucose Intervention Range: <131 mg/dl or >249 mg/dl Time Vitals Blood Respiratory Capillary Blood Glucose Pulse Action Type: Pulse: Temperature: Taken: Pressure: Rate: Glucose (mg/dl): Meter #: Oximetry (%) Taken: Pre 09:49 164/87 100 16 97.7 209 2 100 Post 12:23 141/91 91 16 97.2 137 2 Treatment Response Treatment Completion Status: Treatment Completed without Adverse Event Treatment Notes Upon reaching 10psi (1009) patient complained of pain in left ear. Travel was stopped and set pressure was reversed in order to  help patient equalize pressure in middle ear(s). Upon reaching 8 psi at 1010 patient stated ears were equalized. Continued travel at 1.5 psi/min and the problem repeated at/near treatment depth of 2.5 ATA (22psi). Set pressure was reversed again to accommodate ear equalization. Upon reaching 20 psi patient stated that ears had equalized. Continued to 2.5 ATA and treatment proceeded. Patient had no other issues. Physician was informed of patient's ear equalization. Additional Procedure Documentation Tissue Sevierity: Fat layer exposed Physician HBO Attestation: I certify that I supervised this HBO treatment in accordance with Medicare guidelines. A trained emergency response team is readily available per Yes hospital policies and procedures. Continue HBOT as ordered. Yes Electronic Signature(s) Signed: 03/16/2021 3:54:51 PM By: Kalman Shan DO Previous Signature: 03/16/2021 1:53:55 PM Version By: [name] [name] , , Previous Signature: 03/16/2021 1:37:00 PM Version By: [name] [name] , , Entered By: Kalman Shan on 03/16/2021 15:53:46 -------------------------------------------------------------------------------- HBO Safety Checklist Details Patient Name: Date of Service: [name] RD, [name] 03/16/2021 10:00 A M Medical Record Number: [id number] Patient Account Number: [id number] Date of Birth/Sex: Treating RN: October 04, 1970 (50 y.o. [name] Primary Care Doshie Maggi: [name] Other Clinician: [name] Referring Yvette Loveless: Treating Emilea Goga/Extender: [name], [name] in Treatment: 27 HBO Safety Checklist Items Safety Checklist Consent Form Signed Patient voided / foley secured and emptied When did you last eato 0630 Last dose of injectable or oral agent 0700 Ostomy pouch emptied and vented if applicable NA All implantable devices assessed, documented and approved NA Intravenous access site secured and  place NA Valuables secured Linens and cotton and cotton/polyester blend (less than 51% polyester) Personal oil-based products / skin lotions / body lotions removed Wigs or hairpieces removed NA Smoking or tobacco materials removed NA Books /  newspapers / magazines / loose paper removed Cologne, aftershave, perfume and deodorant removed Jewelry removed (may wrap wedding band) Make-up removed NA Hair care products removed Battery operated devices (external) removed Heating patches and chemical warmers removed Titanium eyewear removed NA Nail polish cured greater than 10 hours NA Casting material cured greater than 10 hours NA Hearing aids removed NA Loose dentures or partials removed NA Prosthetics have been removed NA Patient demonstrates correct use of air break device (if applicable) Patient concerns have been addressed Patient grounding bracelet on and cord attached to chamber Specifics for Inpatients (complete in addition to above) Medication sheet sent with patient NA Intravenous medications needed or due during therapy sent with patient NA Drainage tubes (e.g. nasogastric tube or chest tube secured and vented) NA Endotracheal or Tracheotomy tube secured NA Cuff deflated of air and inflated with saline NA Airway suctioned NA Notes Paper version used prior to treatment. Electronic Signature(s) Signed: 03/17/2021 12:36:38 PM By: [name] [name] , , Previous Signature: 03/16/2021 1:27:30 PM Version By: [name] [name] , , Entered By: [name] on 03/17/2021 12:36:38

## 2021-03-17 ENCOUNTER — Encounter (HOSPITAL_BASED_OUTPATIENT_CLINIC_OR_DEPARTMENT_OTHER): Payer: 59 | Admitting: Internal Medicine

## 2021-03-17 DIAGNOSIS — E1169 Type 2 diabetes mellitus with other specified complication: Secondary | ICD-10-CM | POA: Diagnosis not present

## 2021-03-17 LAB — GLUCOSE, CAPILLARY
Glucose-Capillary: 285 mg/dL — ABNORMAL HIGH (ref 70–99)
Glucose-Capillary: 132 mg/dL — ABNORMAL HIGH (ref 70–99)

## 2021-03-17 NOTE — Progress Notes (Signed)
[name], [name] ([id number]) Visit Report for 03/17/2021 SuperBill Details Patient Name: Date of Service: [address], [place] 03/17/2021 Medical Record Number: [id number] Patient Account Number: [id number] Date of Birth/Sex: Treating RN: [birth year] (50 y.o. [name] Primary Care Provider: [name] Other Clinician: [name] Referring Provider: Treating Provider/Extender: [name], [name] in Treatment: 27 Diagnosis Coding ICD-10 Codes Code Description [id number] Other chronic osteomyelitis, right ankle and foot E11.621 Type 2 diabetes mellitus with foot ulcer L97.514 Non-pressure chronic ulcer of other part of right foot with necrosis of bone T81.31XS Disruption of external operation (surgical) wound, not elsewhere classified, sequela F17.218 Nicotine dependence, cigarettes, with other nicotine-induced disorders L97.522 Non-pressure chronic ulcer of other part of left foot with fat layer exposed L84 Corns and callosities Facility Procedures CPT4 Code Description Modifier Quantity [id number] G0277-(Facility Use Only) HBOT full body chamber, 43min , 4 ICD-10 Diagnosis Description E11.621 Type 2 diabetes mellitus with foot ulcer M86.671 Other chronic osteomyelitis, right ankle and foot Physician Procedures Quantity CPT4 Code Description Modifier [id number] - WC PHYS HYPERBARIC OXYGEN THERAPY 1 ICD-10 Diagnosis Description E11.621 Type 2 diabetes mellitus with foot ulcer M86.671 Other chronic osteomyelitis, right ankle and foot Electronic Signature(s) Signed: 03/17/2021 2:24:14 PM By: [name] [name] , , Signed: 03/17/2021 4:29:35 PM By: [name] MD Entered By: [name] on 03/17/2021 14:24:11

## 2021-03-17 NOTE — Progress Notes (Signed)
LAYNE, DILAURO ([id number]) Visit Report for 03/17/2021 Arrival Information Details Patient Name: Date of Service: [place] [address], [name] 03/17/2021 10:00 A M Medical Record Number: [id number] Patient Account Number: [id number] Date of Birth/Sex: Treating RN: [birth year] (50 y.o. [name] Primary Care Clemencia Helzer: [name] Other Clinician: [name] Referring Mysha Peeler: Treating Buffi Ewton/Extender: [name], [name] in Treatment: 27 Visit Information History Since Last Visit All ordered tests and consults were completed: Yes Patient Arrived: [name] Added or deleted any medications: No Arrival Time: 09:50 Any new allergies or adverse reactions: No Accompanied By: self Had a fall or experienced change in No Transfer Assistance: None activities of daily living that may affect Patient Identification Verified: Yes risk of falls: Secondary Verification Process Completed: Yes Signs or symptoms of abuse/neglect since last visito No Patient Requires Transmission-Based Precautions: No Hospitalized since last visit: No Patient Has Alerts: No Implantable device outside of the clinic excluding No cellular tissue based products placed in the center since last visit: Pain Present Now: No Electronic Signature(s) Signed: 03/17/2021 2:10:05 PM By: [name] [name] , , Entered By: [name] on 03/17/2021 14:10:05 -------------------------------------------------------------------------------- Encounter Discharge Information Details Patient Name: Date of Service: [name], [place]. 03/17/2021 10:00 A M Medical Record Number: [id number] Patient Account Number: [id number] Date of Birth/Sex: Treating RN: 01/15/1971 (50 y.o. [name] Primary Care Runell Kovich: [name] Other Clinician: [name] Referring Kaiel Weide: Treating Aricka Goldberger/Extender: [name], [name] in Treatment: 27 Encounter Discharge  Information Items Discharge Condition: Stable Ambulatory Status: Cane Discharge Destination: Home Transportation: Private Auto Accompanied By: self Schedule Follow-up Appointment: No Clinical Summary of Care: Electronic Signature(s) Signed: 03/17/2021 2:25:24 PM By: [name] [name] , , Entered By: [name] on 03/17/2021 14:25:24 -------------------------------------------------------------------------------- Desha Details Patient Name: Date of Service: [name], [name] 03/17/2021 10:00 A M Medical Record Number: [id number] Patient Account Number: [id number] Date of Birth/Sex: Treating RN: [birth year] (50 y.o. [name] Primary Care Martino Tompson: [name] Other Clinician: [name] Referring Rashi Granier: Treating Addalynne Golding/Extender: [name], [name] in Treatment: 27 Vital Signs Time Taken: 10:08 Temperature (F): 97.9 Height (in): 65 Pulse (bpm): 98 Weight (lbs): 196 Respiratory Rate (breaths/min): 18 Body Mass Index (BMI): 32.6 Blood Pressure (mmHg): 144/96 Capillary Blood Glucose (mg/dl): 285 Reference Range: 80 - 120 mg / dl Electronic Signature(s) Signed: 03/17/2021 2:10:39 PM By: [name] [name] , , Entered By: [name] on 03/17/2021 14:10:39

## 2021-03-17 NOTE — Progress Notes (Addendum)
[name], [name] ([id number]) Visit Report for 03/17/2021 HBO Details Patient Name: Date of Service: [place] RD, [name] 03/17/2021 10:00 A M Medical Record Number: [id number] Patient Account Number: [id number] Date of Birth/Sex: Treating RN: 03-03-1971 (50 y.o. [name] Primary Care Aleighya Mcanelly: [name] Other Clinician: [name] Referring Albin Duckett: Treating Sibbie Flammia/Extender: [name], [name] in Treatment: 27 HBO Treatment Course Details Treatment Course Number: 1 Ordering Mariam Helbert: [name] Treatments Ordered: otal 60 HBO Treatment Start Date: 12/08/2020 HBO Indication: Diabetic Ulcer(s) of the Lower Extremity HBO Treatment Details Treatment Number: 41 Patient Type: Outpatient Chamber Type: Monoplace Chamber Serial #: [id number] Treatment Protocol: 2.5 ATA with 90 minutes oxygen, with two 5 minute air breaks Treatment Details Compression Rate Down: 2.0 psi / minute De-Compression Rate Up: 2.0 psi / minute A breaks and breathing ir Compress Tx Pressure periods Decompress Decompress Begins Reached (leave unused spaces Begins Ends blank) Chamber Pressure (ATA 1 2.5 2.5 2.5 2.5 2.5 - - 2.5 1 ) Clock Time (24 hr) 10:41 11:01 11:31 11:36 12:06 12:11 - - 12:41 12:56 Treatment Length: 135 (minutes) Treatment Segments: 4 Vital Signs Capillary Blood Glucose Reference Range: 80 - 120 mg / dl HBO Diabetic Blood Glucose Intervention Range: <131 mg/dl or >249 mg/dl Time Vitals Blood Respiratory Capillary Blood Glucose Pulse Action Type: Pulse: Temperature: Taken: Pressure: Rate: Glucose (mg/dl): Meter #: Oximetry (%) Taken: Pre 10:08 144/96 98 18 97.9 285 Post 13:03 128/88 88 18 98.1 132 Treatment Response Treatment Toleration: Well Treatment Completion Status: Treatment Completed without Adverse Event Additional Procedure Documentation Tissue Sevierity: Other severity specified Tedford Berg Notes No concerns with treatment given  The patient complained of fullness in his left ear it was a bit difficult to see because of cerumen but I think there is hemorrhage in the middle ear. I am going to cancel his treatment tomorrow have him seen by ENT at 345 Physician HBO Attestation: I certify that I supervised this HBO treatment in accordance with Medicare guidelines. A trained emergency response team is readily available per Yes hospital policies and procedures. Continue HBOT as ordered. Yes Electronic Signature(s) Signed: 03/17/2021 4:29:35 PM By: [name] MD Previous Signature: 03/17/2021 2:20:51 PM Version By: [name] [name] , , Entered By: [name] on 03/17/2021 16:28:16 -------------------------------------------------------------------------------- HBO Safety Checklist Details Patient Name: Date of Service: [name] RD, [name] 03/17/2021 10:00 A M Medical Record Number: [id number] Patient Account Number: [id number] Date of Birth/Sex: Treating RN: 02-08-1971 (50 y.o. [name] Primary Care Nakia Remmers: [name] Other Clinician: [name] Referring Timonthy Hovater: Treating Lutie Pickler/Extender: [name], [name] in Treatment: 27 HBO Safety Checklist Items Safety Checklist Consent Form Signed Patient voided / foley secured and emptied When did you last eato 0700 Last dose of injectable or oral agent 0700 Ostomy pouch emptied and vented if applicable NA All implantable devices assessed, documented and approved NA Intravenous access site secured and place NA Valuables secured Linens and cotton and cotton/polyester blend (less than 51% polyester) Personal oil-based products / skin lotions / body lotions removed Wigs or hairpieces removed NA Smoking or tobacco materials removed NA Books / newspapers / magazines / loose paper removed Cologne, aftershave, perfume and deodorant removed Jewelry removed (may wrap wedding band) Make-up removed NA Hair  care products removed Battery operated devices (external) removed Heating patches and chemical warmers removed Titanium eyewear removed NA Nail polish cured greater than 10 hours NA Casting material cured greater than 10 hours NA Hearing  aids removed NA Loose dentures or partials removed NA Prosthetics have been removed NA Patient demonstrates correct use of air break device (if applicable) Patient concerns have been addressed Patient grounding bracelet on and cord attached to chamber Specifics for Inpatients (complete in addition to above) Medication sheet sent with patient NA Intravenous medications needed or due during therapy sent with patient NA Drainage tubes (e.g. nasogastric tube or chest tube secured and vented) NA Endotracheal or Tracheotomy tube secured NA Cuff deflated of air and inflated with saline NA Airway suctioned NA Notes Paper version used prior to treatment. Electronic Signature(s) Signed: 03/18/2021 1:51:31 PM By: [name] [name] , , Previous Signature: 03/17/2021 2:11:59 PM Version By: [name] [name] , , Entered By: [name] on 03/18/2021 13:51:30

## 2021-03-18 ENCOUNTER — Encounter (HOSPITAL_BASED_OUTPATIENT_CLINIC_OR_DEPARTMENT_OTHER): Payer: 59 | Admitting: Physician Assistant

## 2021-03-18 ENCOUNTER — Other Ambulatory Visit: Payer: Self-pay

## 2021-03-18 DIAGNOSIS — H6983 Other specified disorders of Eustachian tube, bilateral: Secondary | ICD-10-CM | POA: Insufficient documentation

## 2021-03-18 DIAGNOSIS — E1169 Type 2 diabetes mellitus with other specified complication: Secondary | ICD-10-CM | POA: Diagnosis not present

## 2021-03-18 DIAGNOSIS — H6993 Unspecified Eustachian tube disorder, bilateral: Secondary | ICD-10-CM | POA: Insufficient documentation

## 2021-03-18 NOTE — Progress Notes (Addendum)
[name], [name] ([id number]) Visit Report for 03/18/2021 Chief Complaint Document Details Patient Name: Date of Service: [place] RD, [place] 03/18/2021 1:15 PM Medical Record Number: [id number] Patient Account Number: [id number] Date of Birth/Sex: Treating RN: 01/24/1971 (50 y.o. [name] Primary Care Provider: [name] Other Clinician: Referring Provider: Treating Provider/Extender: [name], [name] in Treatment: 27 Information Obtained from: Patient Chief Complaint 09/05/2020; patient is here for review of the wound extensively on a right TMA amputation site. He also has a wound on the plantar aspect of the left foot Electronic Signature(s) Signed: 03/18/2021 2:17:06 PM By: Worthy Keeler PA-C Entered By: Worthy Keeler on 03/18/2021 14:17:06 -------------------------------------------------------------------------------- Debridement Details Patient Name: Date of Service: [name], [name] 03/18/2021 1:15 PM Medical Record Number: [id number] Patient Account Number: [id number] Date of Birth/Sex: Treating RN: 02/04/1971 (50 y.o. [name] Primary Care Provider: [name] Other Clinician: Referring Provider: Treating Provider/Extender: [name], [name] in Treatment: 27 Debridement Performed for Assessment: Wound #7 Right,Lateral,Plantar Foot Performed By: Physician Worthy Keeler, PA Debridement Type: Debridement Severity of Tissue Pre Debridement: Bone involvement without necrosis Level of Consciousness (Pre-procedure): Awake and Alert Pre-procedure Verification/Time Out Yes - 14:25 Taken: T Area Debrided (L x W): otal 1 (cm) x 1 (cm) = 1 (cm) Tissue and other material debrided: Viable, Non-Viable, Callus, Slough, Subcutaneous, Skin: Epidermis, Slough Level: Skin/Subcutaneous Tissue Debridement Description: Excisional Instrument: Curette Bleeding: Minimum Hemostasis Achieved: Pressure Procedural  Pain: 0 Post Procedural Pain: 0 Response to Treatment: Procedure was tolerated well Level of Consciousness (Post- Awake and Alert procedure): Post Debridement Measurements of Total Wound Length: (cm) 0.5 Width: (cm) 0.4 Depth: (cm) 2.5 Volume: (cm) 0.393 Character of Wound/Ulcer Post Debridement: Improved Severity of Tissue Post Debridement: Bone involvement without necrosis Post Procedure Diagnosis Same as Pre-procedure Electronic Signature(s) Signed: 03/18/2021 5:18:31 PM By: Worthy Keeler PA-C Signed: 03/18/2021 6:08:54 PM By: Baruch Gouty RN, BSN Entered By: Baruch Gouty on 03/18/2021 14:38:30 -------------------------------------------------------------------------------- HPI Details Patient Name: Date of Service: [name] RD, [place] 03/18/2021 1:15 PM Medical Record Number: [id number] Patient Account Number: [id number] Date of Birth/Sex: Treating RN: 04-03-1970 (50 y.o. [name] Primary Care Provider: [name] Other Clinician: Referring Provider: Treating Provider/Extender: [name], [name] in Treatment: 27 History of Present Illness HPI Description: ADMISSION 09/05/2020 This is a 50 year old man who has type 2 diabetes. Essentially the problem began admitted to [facility] in December with infection gas gangrene. Cultures ultimately grew staph lugdunensis. 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 March Rummage. Shortly thereafter he developed a nonhealing wound with wound dehiscence noted on 05/09/2020. He had a wound VAC I think for a period of time after the surgery but it did not help. He has undergone an IandD and skin graft on the right foot on 05/16/2020. He underwent a further IandD on 06/04/2020 not debrided to bone. 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 Culture of the metatarsal grew Enterobacter cloacae I. He  has been followed by Dr. [name] of infectious disease. Currently on Levaquin and doxycycline I think at about the 4-week of 6 mark. At the last visit with Dr. March Rummage he was supposed to have use Santyl although I do not think he has it and he has been using I think a Betadine wet-to-dry. He is referred here by Dr. [name] for our  review who saw him yesterday. I cannot see that he has had imaging studies of the right foot. No MRI Past medical history includes type 2 diabetes, in 2021 and ulcer of the left foot that ultimately healed with a wound VAC, stage III chronic kidney disease, first toe amputations bilaterally, hypertension, still a 1/2 pack/day smoker. The patient had ABIs on 08/09/2018 which was 1.00 on the right and 0.96 on the left both areas had triphasic waveforms. Not felt to have an arterial issue. 09/12/2020; 1 week follow-up. He has been doing the dressings along with home health. This is a deep punched out area in the incision of his transmit site. We use silver alginate last week to help with drainage and antibacterial properties. He tolerated this well 6/24; the patient was actually at his podiatrist this morning who debrided the wound. We have been using silver alginate. They are going to follow him in 6 weeks. 6/29; patient is using silver alginate. Apparently saw Dr. March Rummage this week although I have not checked his note I will try to do so. He is following up in 6 weeks. This was the original transmetatarsal amputation when I first saw this 3 mid part of his amputation site was deep down to bone however this seems to have progressively close down which is gratifying to see. He still has a fairly callus uneven surface however spreading this apart its difficult to identify anything that does not look epithelialized. When I first saw this I thought he would require an extensive debridement perhaps back in the OR by Dr. March Rummage however all of this appears to be looking better and at this point I  would continue with the silver alginate see if this holds together over the next several weeks. He does not have an arterial issue 7/13; 2-week follow-up. Unfortunately the area did not hold together there was drainage on his dressing. Still copious amounts of callus. It was difficult to see where this actually was open therefore I elected to go ahead with a vigorous debridement. Still using silver alginate 7/20 I brought him back after extensive callus debridement last week. The only open area was on the medial part of the foot extensive debridement of thick surrounding callus and I was able to do identify the remaining tunneling area. 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 This is the area that we may have been following. This is smaller this week but still surrounded with thick callus He came in today with a large area of callus on the left midfoot. This I had noticed previously. HOWEVER our intake nurse clearly incorrectly documented this as opening. The callus and split there was an odor so all of this had to be removed. 10/29/2020 unfortunately upon evaluation today this patient appears to have significant mount of callus noted at this point. There does not appear to be any signs of active infection systemically which is great although locally he is having some discomfort around the lateral portion of his foot on the right. The left foot is no having a lot of callus I am not certain even has an open wound if there is anything is extremely tiny. 11/05/2020 upon evaluation today patient's wounds actually appear to be doing a bit better compared to last week since we have uncovered some of these areas I think there are some definite improvements. With that being said there is still quite a bit of  tunneling and undermining at many locations that again has me concerned I still think that the MRI is the best thing to do he has that  scheduled for Saturday. 11/26/2020 upon evaluation today patient appears to be doing decently well in regard to his foot in general. He did have an MRI I did review this and he had multiple findings on MRI consistent with osteomyelitis across the board in regard pretty much to his remaining metatarsal regions. Nonetheless there was also some evidence of some involvement of the navicular bone as well. Either way I think that this patient actually is in desperate need of get him in for hyperbaric oxygen therapy we discussed that today that will be detailed in the plan. Nonetheless he is also still on the doxycycline which I think is appropriate for the time being as well I did go ahead and place him on that following the results of the MRI based on what we were seeing. I gave him that prescription for 14 days for the time being and we will go from there. 12/03/2020 upon evaluation today patient appears to be doing decently well in regard to his wounds in general in regard to the right foot. With that being said he does have a callus on the left foot in the end there was actually an area open area here that will need to be addressed. This is small and I think we take care of it now hopefully will heal quite readily. Fortunately I do not see any signs of infection worsening I am getting need to refill the antibiotic for him today. He has been taking doxycycline which seems to be doing well. We will also get a go ahead and get him approved for HBO therapy. 12/31/2020 upon evaluation today patient appears to be doing really roughly the same as compared to last time I saw him. Fortunately there is no evidence of active infection which is great news systemically he has started hyperbarics he seems to be doing decently well he said a couple times where anxiety kind of got to him but overall I think he is doing awesome. He is no longer on the antibiotic therapy. With that being said I do believe that he is going to  need likely some additional antibiotics based on what I am seeing today we can actually obtain a bone culture from the plantar wound that was hiding underneath the callus. He was having pain in this area which is what may be try to clear away the callus to look sure enough there was an actual opening that went deeper into the wound bed region here. 01/21/2021 upon evaluation today patient appears to be doing about the same in regard to his foot in general. He does have some depth to some of the wound areas I really think he would benefit from is trying to see about getting him set up for a gauze VAC. I think that the gauze VAC could be helpful for him and will work much more effectively with what we are seeing as compared to traditional foam wound VAC. The patient is in agreement with giving this a trial. Nonetheless unfortunately think that he overall seems to be doing really well and I think we are headed in the right direction as far as the overall healing is concerned. 01/28/2021 upon evaluation today patient appears to be doing well with regard to his wound all things considered he does have a lot of callus buildup especially on the plantar  aspect of the left foot. There is no wound here but a significant amount of callus. That is getting need to be addressed today. Subsequently he also has a lot of callus on the right foot as well as some of the area around the wounds actually being significantly callused. Again I need to clear this away today. 02/04/2021 upon evaluation today patient actually seems to be doing quite well with regard to his foot ulcer. I do believe that the hyperbaric oxygen therapy has been beneficial for him thus far also think that he is doing well with the wound VAC. In combination I think he has an excellent plan at this time as far as treatment is concerned. I do not see any signs of evidence of infection which is great news as well. 02/11/2021 upon evaluation today patient  appears to be doing okay in regard to his foot ulcer though still has deep wounds that do not seem to be doing nearly as good as what I would like to see. I think that the wound VAC is helping but I do believe there still bone in the base of the wound I think we probably need to see about put him back on an antibiotic, do a PCR culture today to see what we find in order to determine whether or not we need to have him on antibiotics which I think we probably do is just a matter which 1. 02/18/2021 upon evaluation today patient appears to be doing okay with regard to his foot ulcer. He has been using the wound VAC although there is a lot of drainage and to be honest I am not certain this is doing as well as we wanted to right now especially considering the fact the soles of gotten so small which in some ways is good but in other ways is still problematic for Sorbact standpoint. I am not even certain we will be able to reinitiate the VAC even next week. Nonetheless for now he is on the [place] which is definitely something that we need to get going I am also can see about a referral back to infectious disease to see what they have to say in regard to what is going on currently as well. He voiced understanding and is in agreement with that plan. I did explain this is a very touchy go situation which hinges on [place] getting this healed and the infection improved if not he is looking at amputation. Obviously this is a limb salvage Plan at this point. 02/25/2021 upon evaluation today patient's wound bed actually showed signs of being much drier and much less macerated compared to last week. We had a wound VAC on hold for this week. With that being said based on what I am seeing I think we probably just want to cancel the wound VAC altogether. I do not think it was doing enough for him to warrant the maceration and risks that are associated. He is on the [place] at this point. He also has an infection  disease appointment next week. We will see what they have to say at this juncture. Nonetheless currently he is wrapping up his first round of hyperbaric oxygen therapy we could consider repeat although the biggest issue right now is simply that he actually is having surgery on his limbs due to cataract issues that is tomorrow. Following that surgery I am not certain he really needs to be in the chamber for some period of time we need to see what  his surgeon says. 03/04/2021 upon evaluation today patient's foot actually does appear to be doing better which is great news. I do not see any signs of infection he has been tolerating the [place] though he is done with that currently I think we probably need to reopen this for a little bit longer to ensure that he is completely treated. He is in agreement with this plan. Subsequently I will send that into the pharmacy for him today. 03/11/2021 upon evaluation today patient's wounds on the foot actually appear to still be doing about the same. I do not see any evidence of significant improvement which is not good although it is also not significantly worse which is good. It overall he is going to start back on hyperbarics this coming Monday. Obviously I think that can be beneficial for him as well and is going to likely help out with getting this hopefully close. 03/18/2021 upon evaluation today patient appears to be doing still somewhat poorly in regard to his foot unfortunately is also having some issues with his ear he is having to go to ENT today to see what they can do to help out in this regard. Fortunately I do not see any signs of active infection which is great news. No fevers, chills, nausea, vomiting, or diarrhea. Electronic Signature(s) Signed: 03/18/2021 2:40:11 PM By: Worthy Keeler PA-C Entered By: Worthy Keeler on 03/18/2021 14:40:11 -------------------------------------------------------------------------------- Physical Exam  Details Patient Name: Date of Service: [name], [name] 03/18/2021 1:15 PM Medical Record Number: [id number] Patient Account Number: [id number] Date of Birth/Sex: Treating RN: Mar 24, 1971 (50 y.o. [name] Primary Care Provider: [name] Other Clinician: Referring Provider: Treating Provider/Extender: [name], [name] in Treatment: 20 Constitutional Well-nourished and well-hydrated in no acute distress. Respiratory normal breathing without difficulty. Psychiatric this patient is able to make decisions and demonstrates good insight into disease process. Alert and Oriented x 3. pleasant and cooperative. Notes Upon inspection patient's wound bed showed signs of good granulation and epithelization at this point. Fortunately there does not appear to be any signs of active infection locally nor systemically. I did perform some debridement in regard to the plantar foot wound I was able to clear away a lot of the callus and some of the necrotic tissue internally. Nonetheless I think that switching over to silver alginate packing is probably can to be the best way to go here I am not certain endoform is given enough here for him. Electronic Signature(s) Signed: 03/18/2021 2:40:43 PM By: Worthy Keeler PA-C Entered By: Worthy Keeler on 03/18/2021 14:40:43 -------------------------------------------------------------------------------- Physician Orders Details Patient Name: Date of Service: [name], [place] [name] 03/18/2021 1:15 PM Medical Record Number: [id number] Patient Account Number: [id number] Date of Birth/Sex: Treating RN: [birth year] (50 y.o. [name] Primary Care Provider: [name] Other Clinician: Referring Provider: Treating Provider/Extender: [name], [name] in Treatment: 27 Verbal / Phone Orders: No Diagnosis Coding ICD-10 Coding Code Description [phone] Other chronic osteomyelitis, right ankle and  foot E11.621 Type 2 diabetes mellitus with foot ulcer L97.514 Non-pressure chronic ulcer of other part of right foot with necrosis of bone T81.31XS Disruption of external operation (surgical) wound, not elsewhere classified, sequela F17.218 Nicotine dependence, cigarettes, with other nicotine-induced disorders L97.522 Non-pressure chronic ulcer of other part of left foot with fat layer exposed L84 Corns and callosities Follow-up Appointments ppointment in 2 weeks. Margarita Grizzle Return A Nurse Visit: - 1 week Wed 12/28 and  Friday 12/30 Bathing/ Shower/ Hygiene May shower with protection but do not get wound dressing(s) wet. Edema Control - Lymphedema / SCD / Other Elevate legs to the level of the heart or above for 30 minutes daily and/or when sitting, a frequency of: Avoid standing for long periods of time. Moisturize legs daily. - to foot with dressing changes Off-Loading Open toe surgical shoe to: - Wear at all times except driving. on right foot Other: - may wear shoe with memory foam insert to left foot, cut hole where callous is and insert in shoe to help offload Additional Orders / Instructions Stop/Decrease Smoking Follow Nutritious Diet Non Wound Condition Other Non Wound Condition Orders/Instructions: - cushion bottom of left foot with orthopedic felt or foam Hyperbaric Oxygen Therapy Evaluate for HBO Therapy Indication: - wagner grade 3 diabetic foot ulcer of right foot If appropriate for treatment, begin HBOT per protocol: 2.5 ATA for 90 Minutes with 2 Five (5) Minute A Breaks ir Total Number of Treatments: - additional 20 One treatments per day (delivered Monday through Friday unless otherwise specified in Special Instructions below): Finger stick Blood Glucose Pre- and Post- HBOT Treatment. Follow Hyperbaric Oxygen Glycemia Protocol A frin (Oxymetazoline HCL) 0.05% nasal spray - 1 spray in both nostrils daily as needed prior to HBO treatment for difficulty clearing  ears Wound Treatment Wound #3 - Foot Wound Laterality: Right, Lateral, Proximal Cleanser: Soap and Water 3 x Per Week/30 Days Discharge Instructions: May shower and wash wound with dial antibacterial soap and water prior to dressing change. Cleanser: Wound Cleanser 3 x Per Week/30 Days Discharge Instructions: Cleanse the wound with wound cleanser prior to applying a clean dressing using gauze sponges, not tissue or cotton balls. Peri-Wound Care: Sween Lotion (Moisturizing lotion) 3 x Per Week/30 Days Discharge Instructions: Apply moisturizing lotion to foot with dressing changes Prim Dressing: KerraCel Ag Gelling Fiber Dressing, 2x2 in (silver alginate) 3 x Per Week/30 Days ary Discharge Instructions: Apply silver alginate to wound bed pack into tunnels Secondary Dressing: Woven Gauze Sponge, Non-Sterile 4x4 in 3 x Per Week/30 Days Discharge Instructions: Apply over primary dressing as directed. Secondary Dressing: ABD Pad, 5x9 3 x Per Week/30 Days Discharge Instructions: Apply over primary dressing as directed. Compression Wrap: Kerlix Roll 4.5x3.1 (in/yd) 3 x Per Week/30 Days Discharge Instructions: Apply Kerlix and Coban compression as directed. Compression Wrap: Coban Self-Adherent Wrap 4x5 (in/yd) 3 x Per Week/30 Days Discharge Instructions: Apply over Kerlix as directed. Wound #7 - Foot Wound Laterality: Plantar, Right, Lateral Cleanser: Soap and Water 3 x Per Week/30 Days Discharge Instructions: May shower and wash wound with dial antibacterial soap and water prior to dressing change. Cleanser: Wound Cleanser 3 x Per Week/30 Days Discharge Instructions: Cleanse the wound with wound cleanser prior to applying a clean dressing using gauze sponges, not tissue or cotton balls. Peri-Wound Care: Sween Lotion (Moisturizing lotion) 3 x Per Week/30 Days Discharge Instructions: Apply moisturizing lotion to foot with dressing changes Prim Dressing: KerraCel Ag Gelling Fiber Dressing, 2x2 in  (silver alginate) 3 x Per Week/30 Days ary Discharge Instructions: Apply silver alginate to wound bed pack into tunnels Secondary Dressing: Woven Gauze Sponge, Non-Sterile 4x4 in 3 x Per Week/30 Days Discharge Instructions: Apply over primary dressing as directed. Secondary Dressing: ABD Pad, 5x9 3 x Per Week/30 Days Discharge Instructions: Apply over primary dressing as directed. Compression Wrap: Kerlix Roll 4.5x3.1 (in/yd) 3 x Per Week/30 Days Discharge Instructions: Apply Kerlix and Coban compression as directed. Compression Wrap: Coban Self-Adherent  Wrap 4x5 (in/yd) 3 x Per Week/30 Days Discharge Instructions: Apply over Kerlix as directed. GLYCEMIA INTERVENTIONS PROTOCOL PRE-HBO GLYCEMIA INTERVENTIONS ACTION INTERVENTION Obtain pre-HBO capillary blood glucose (ensure 1 physician order is in chart). A. Notify HBO physician and await physician orders. 2 If result is 70 mg/dl or below: B. If the result meets the hospital definition of a critical result, follow hospital policy. A. Give patient an 8 ounce Glucerna Shake, an 8 ounce Ensure, or 8 ounces of a Glucerna/Ensure equivalent dietary supplement*. B. Wait 30 minutes. If result is 71 mg/dl to 130 mg/dl: C. Retest patients capillary blood glucose (CBG). D. If result greater than or equal to 110 mg/dl, proceed with HBO. If result less than 110 mg/dl, notify HBO physician and consider holding HBO. If result is 131 mg/dl to 249 mg/dl: A. Proceed with HBO. A. Notify HBO physician and await physician orders. B. It is recommended to hold HBO and do If result is 250 mg/dl or greater: blood/urine ketone testing. C. If the result meets the hospital definition of a critical result, follow hospital policy. POST-HBO GLYCEMIA INTERVENTIONS ACTION INTERVENTION Obtain post HBO capillary blood glucose (ensure 1 physician order is in chart). A. Notify HBO physician and await physician orders. 2 If result is 70 mg/dl or  below: B. If the result meets the hospital definition of a critical result, follow hospital policy. A. Give patient an 8 ounce Glucerna Shake, an 8 ounce Ensure, or 8 ounces of a Glucerna/Ensure equivalent dietary supplement*. B. Wait 15 minutes for symptoms of If result is 71 mg/dl to 100 mg/dl: hypoglycemia (i.e. nervousness, anxiety, sweating, chills, clamminess, irritability, confusion, tachycardia or dizziness). C. If patient asymptomatic, discharge patient. If patient symptomatic, repeat capillary blood glucose (CBG) and notify HBO physician. If result is 101 mg/dl to 249 mg/dl: A. Discharge patient. A. Notify HBO physician and await physician orders. B. It is recommended to do blood/urine ketone If result is 250 mg/dl or greater: testing. C. If the result meets the hospital definition of a critical result, follow hospital policy. *Juice or candies are NOT equivalent products. If patient refuses the Glucerna or Ensure, please consult the hospital dietitian for an appropriate substitute. Electronic Signature(s) Signed: 03/18/2021 5:18:31 PM By: Worthy Keeler PA-C Signed: 03/18/2021 6:08:54 PM By: Baruch Gouty RN, BSN Entered By: Baruch Gouty on 03/18/2021 14:51:02 -------------------------------------------------------------------------------- Problem List Details Patient Name: Date of Service: [name] RD, [place] [name] 03/18/2021 1:15 PM Medical Record Number: [id number] Patient Account Number: [id number] Date of Birth/Sex: Treating RN: April 06, 1970 (50 y.o. [name] Primary Care Provider: Minette Brine, [place] Other Clinician: Referring Provider: Treating Provider/Extender: [name], [name] in Treatment: 27 Active Problems ICD-10 Encounter Code Description Active Date MDM Diagnosis [phone] Other chronic osteomyelitis, right ankle and foot 09/05/2020 No Yes E11.621 Type 2 diabetes mellitus with foot ulcer 09/05/2020 No Yes L97.514  Non-pressure chronic ulcer of other part of right foot with necrosis of bone 09/05/2020 No Yes T81.31XS Disruption of external operation (surgical) wound, not elsewhere classified, 09/05/2020 No Yes sequela F17.218 Nicotine dependence, cigarettes, with other nicotine-induced disorders 11/26/2020 No Yes L97.522 Non-pressure chronic ulcer of other part of left foot with fat layer exposed 10/22/2020 No Yes L84 Corns and callosities 01/28/2021 No Yes Inactive Problems Resolved Problems Electronic Signature(s) Signed: 03/18/2021 2:16:49 PM By: Worthy Keeler PA-C Entered By: Worthy Keeler on 03/18/2021 14:16:49 -------------------------------------------------------------------------------- Progress Note Details Patient Name: Date of Service: [name], [name] 03/18/2021 1:15 PM Medical Record Number:  [id number] Patient Account Number: [id number] Date of Birth/Sex: Treating RN: Oct 03, 1970 (50 y.o. [name] Primary Care Provider: [name] Other Clinician: Referring Provider: Treating Provider/Extender: [name], [name] in Treatment: 27 Subjective Chief Complaint Information obtained from Patient 09/05/2020; patient is here for review of the wound extensively on a right TMA amputation site. He also has a wound on the plantar aspect of the left foot History of Present Illness (HPI) ADMISSION 09/05/2020 This is a 50 year old man who has type 2 diabetes. Essentially the problem began admitted to [facility] in December with infection gas gangrene. Cultures ultimately grew staph lugdunensis. 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 March Rummage. Shortly thereafter he developed a nonhealing wound with wound dehiscence noted on 05/09/2020. He had a wound VAC I think for a period of time after the surgery but it did not help. He has undergone an IandD and skin graft on the right foot on 05/16/2020. He underwent a further IandD on 06/04/2020  not debrided to bone. 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 Culture of the metatarsal grew Enterobacter cloacae I. He has been followed by Dr. [name] of infectious disease. Currently on Levaquin and doxycycline I think at about the 4-week of 6 mark. At the last visit with Dr. March Rummage he was supposed to have use Santyl although I do not think he has it and he has been using I think a Betadine wet-to-dry. He is referred here by Dr. [name] for our review who saw him yesterday. I cannot see that he has had imaging studies of the right foot. No MRI Past medical history includes type 2 diabetes, in 2021 and ulcer of the left foot that ultimately healed with a wound VAC, stage III chronic kidney disease, first toe amputations bilaterally, hypertension, still a 1/2 pack/day smoker. The patient had ABIs on 08/09/2018 which was 1.00 on the right and 0.96 on the left both areas had triphasic waveforms. Not felt to have an arterial issue. 09/12/2020; 1 week follow-up. He has been doing the dressings along with home health. This is a deep punched out area in the incision of his transmit site. We use silver alginate last week to help with drainage and antibacterial properties. He tolerated this well 6/24; the patient was actually at his podiatrist this morning who debrided the wound. We have been using silver alginate. They are going to follow him in 6 weeks. 6/29; patient is using silver alginate. Apparently saw Dr. March Rummage this week although I have not checked his note I will try to do so. He is following up in 6 weeks. This was the original transmetatarsal amputation when I first saw this 3 mid part of his amputation site was deep down to bone however this seems to have progressively close down which is gratifying to see. He still has a fairly callus uneven surface however spreading this apart its difficult to identify anything that  does not look epithelialized. When I first saw this I thought he would require an extensive debridement perhaps back in the OR by Dr. March Rummage however all of this appears to be looking better and at this point I would continue with the silver alginate see if this holds together over the next several weeks. He does not have an arterial issue 7/13; 2-week follow-up. Unfortunately the area did not hold together there was drainage on his dressing. Still copious amounts  of callus. It was difficult to see where this actually was open therefore I elected to go ahead with a vigorous debridement. Still using silver alginate 7/20 I brought him back after extensive callus debridement last week. The only open area was on the medial part of the foot extensive debridement of thick surrounding callus and I was able to do identify the remaining tunneling area. 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 This is the area that we may have been following. This is smaller this week but still surrounded with thick callus He came in today with a large area of callus on the left midfoot. This I had noticed previously. HOWEVER our intake nurse clearly incorrectly documented this as opening. The callus and split there was an odor so all of this had to be removed. 10/29/2020 unfortunately upon evaluation today this patient appears to have significant mount of callus noted at this point. There does not appear to be any signs of active infection systemically which is great although locally he is having some discomfort around the lateral portion of his foot on the right. The left foot is no having a lot of callus I am not certain even has an open wound if there is anything is extremely tiny. 11/05/2020 upon evaluation today patient's wounds actually appear to be doing a bit better compared to last week since we have uncovered some of these areas I think there are some  definite improvements. With that being said there is still quite a bit of tunneling and undermining at many locations that again has me concerned I still think that the MRI is the best thing to do he has that scheduled for Saturday. 11/26/2020 upon evaluation today patient appears to be doing decently well in regard to his foot in general. He did have an MRI I did review this and he had multiple findings on MRI consistent with osteomyelitis across the board in regard pretty much to his remaining metatarsal regions. Nonetheless there was also some evidence of some involvement of the navicular bone as well. Either way I think that this patient actually is in desperate need of get him in for hyperbaric oxygen therapy we discussed that today that will be detailed in the plan. Nonetheless he is also still on the doxycycline which I think is appropriate for the time being as well I did go ahead and place him on that following the results of the MRI based on what we were seeing. I gave him that prescription for 14 days for the time being and we will go from there. 12/03/2020 upon evaluation today patient appears to be doing decently well in regard to his wounds in general in regard to the right foot. With that being said he does have a callus on the left foot in the end there was actually an area open area here that will need to be addressed. This is small and I think we take care of it now hopefully will heal quite readily. Fortunately I do not see any signs of infection worsening I am getting need to refill the antibiotic for him today. He has been taking doxycycline which seems to be doing well. We will also get a go ahead and get him approved for HBO therapy. 12/31/2020 upon evaluation today patient appears to be doing really roughly the same as compared to last time I saw him. Fortunately there is no evidence of active  infection which is great news systemically he has started hyperbarics he seems to be doing  decently well he said a couple times where anxiety kind of got to him but overall I think he is doing awesome. He is no longer on the antibiotic therapy. With that being said I do believe that he is going to need likely some additional antibiotics based on what I am seeing today we can actually obtain a bone culture from the plantar wound that was hiding underneath the callus. He was having pain in this area which is what may be try to clear away the callus to look sure enough there was an actual opening that went deeper into the wound bed region here. 01/21/2021 upon evaluation today patient appears to be doing about the same in regard to his foot in general. He does have some depth to some of the wound areas I really think he would benefit from is trying to see about getting him set up for a gauze VAC. I think that the gauze VAC could be helpful for him and will work much more effectively with what we are seeing as compared to traditional foam wound VAC. The patient is in agreement with giving this a trial. Nonetheless unfortunately think that he overall seems to be doing really well and I think we are headed in the right direction as far as the overall healing is concerned. 01/28/2021 upon evaluation today patient appears to be doing well with regard to his wound all things considered he does have a lot of callus buildup especially on the plantar aspect of the left foot. There is no wound here but a significant amount of callus. That is getting need to be addressed today. Subsequently he also has a lot of callus on the right foot as well as some of the area around the wounds actually being significantly callused. Again I need to clear this away today. 02/04/2021 upon evaluation today patient actually seems to be doing quite well with regard to his foot ulcer. I do believe that the hyperbaric oxygen therapy has been beneficial for him thus far also think that he is doing well with the wound VAC. In  combination I think he has an excellent plan at this time as far as treatment is concerned. I do not see any signs of evidence of infection which is great news as well. 02/11/2021 upon evaluation today patient appears to be doing okay in regard to his foot ulcer though still has deep wounds that do not seem to be doing nearly as good as what I would like to see. I think that the wound VAC is helping but I do believe there still bone in the base of the wound I think we probably need to see about put him back on an antibiotic, do a PCR culture today to see what we find in order to determine whether or not we need to have him on antibiotics which I think we probably do is just a matter which 1. 02/18/2021 upon evaluation today patient appears to be doing okay with regard to his foot ulcer. He has been using the wound VAC although there is a lot of drainage and to be honest I am not certain this is doing as well as we wanted to right now especially considering the fact the soles of gotten so small which in some ways is good but in other ways is still problematic for Sorbact standpoint. I am not even certain we will  be able to reinitiate the VAC even next week. Nonetheless for now he is on the [place] which is definitely something that we need to get going I am also can see about a referral back to infectious disease to see what they have to say in regard to what is going on currently as well. He voiced understanding and is in agreement with that plan. I did explain this is a very touchy go situation which hinges on [place] getting this healed and the infection improved if not he is looking at amputation. Obviously this is a limb salvage Plan at this point. 02/25/2021 upon evaluation today patient's wound bed actually showed signs of being much drier and much less macerated compared to last week. We had a wound VAC on hold for this week. With that being said based on what I am seeing I think we probably just want  to cancel the wound VAC altogether. I do not think it was doing enough for him to warrant the maceration and risks that are associated. He is on the [place] at this point. He also has an infection disease appointment next week. We will see what they have to say at this juncture. Nonetheless currently he is wrapping up his first round of hyperbaric oxygen therapy we could consider repeat although the biggest issue right now is simply that he actually is having surgery on his limbs due to cataract issues that is tomorrow. Following that surgery I am not certain he really needs to be in the chamber for some period of time we need to see what his surgeon says. 03/04/2021 upon evaluation today patient's foot actually does appear to be doing better which is great news. I do not see any signs of infection he has been tolerating the [place] though he is done with that currently I think we probably need to reopen this for a little bit longer to ensure that he is completely treated. He is in agreement with this plan. Subsequently I will send that into the pharmacy for him today. 03/11/2021 upon evaluation today patient's wounds on the foot actually appear to still be doing about the same. I do not see any evidence of significant improvement which is not good although it is also not significantly worse which is good. It overall he is going to start back on hyperbarics this coming Monday. Obviously I think that can be beneficial for him as well and is going to likely help out with getting this hopefully close. 03/18/2021 upon evaluation today patient appears to be doing still somewhat poorly in regard to his foot unfortunately is also having some issues with his ear he is having to go to ENT today to see what they can do to help out in this regard. Fortunately I do not see any signs of active infection which is great news. No fevers, chills, nausea, vomiting, or diarrhea. Objective Constitutional Well-nourished and  well-hydrated in no acute distress. Vitals Time Taken: 1:37 PM, Height: 65 in, Weight: 196 lbs, BMI: 32.6, Temperature: 98.4 F, Pulse: 98 bpm, Respiratory Rate: 18 breaths/min, Blood Pressure: 126/83 mmHg. Respiratory normal breathing without difficulty. Psychiatric this patient is able to make decisions and demonstrates good insight into disease process. Alert and Oriented x 3. pleasant and cooperative. General Notes: Upon inspection patient's wound bed showed signs of good granulation and epithelization at this point. Fortunately there does not appear to be any signs of active infection locally nor systemically. I did perform some debridement in regard to  the plantar foot wound I was able to clear away a lot of the callus and some of the necrotic tissue internally. Nonetheless I think that switching over to silver alginate packing is probably can to be the best way to go here I am not certain endoform is given enough here for him. Integumentary (Hair, Skin) Wound #3 status is Open. Original cause of wound was Gradually Appeared. The date acquired was: 10/29/2020. The wound has been in treatment 20 weeks. The wound is located on the Right,Proximal,Lateral Foot. The wound measures 1cm length x 0.2cm width x 0.9cm depth; 0.157cm^2 area and 0.141cm^3 volume. There is Fat Layer (Subcutaneous Tissue) exposed. There is no undermining noted, however, there is tunneling at 7:00 with a maximum distance of 2cm. There is a medium amount of serosanguineous drainage noted. There is no granulation within the wound bed. There is a large (67-100%) amount of necrotic tissue within the wound bed including Adherent Slough. Wound #7 status is Open. Original cause of wound was Gradually Appeared. The date acquired was: 12/31/2020. The wound has been in treatment 11 weeks. The wound is located on the [place]. The wound measures 0.5cm length x 0.4cm width x 2.5cm depth; 0.157cm^2 area and 0.393cm^3  volume. There is bone and Fat Layer (Subcutaneous Tissue) exposed. There is no tunneling or undermining noted. There is a medium amount of serosanguineous drainage noted. There is no granulation within the wound bed. There is a large (67-100%) amount of necrotic tissue within the wound bed. Assessment Active Problems ICD-10 Other chronic osteomyelitis, right ankle and foot Type 2 diabetes mellitus with foot ulcer Non-pressure chronic ulcer of other part of right foot with necrosis of bone Disruption of external operation (surgical) wound, not elsewhere classified, sequela Nicotine dependence, cigarettes, with other nicotine-induced disorders Non-pressure chronic ulcer of other part of left foot with fat layer exposed Corns and callosities Procedures Wound #7 Pre-procedure diagnosis of Wound #7 is a Diabetic Wound/Ulcer of the Lower Extremity located on the Right,Lateral,Plantar Foot .Severity of Tissue Pre Debridement is: Bone involvement without necrosis. There was a Excisional Skin/Subcutaneous Tissue Debridement with a total area of 1 sq cm performed by Worthy Keeler, PA. With the following instrument(s): Curette to remove Viable and Non-Viable tissue/material. Material removed includes Callus, Subcutaneous Tissue, Slough, and Skin: Epidermis. No specimens were taken. A time out was conducted at 14:25, prior to the start of the procedure. A Minimum amount of bleeding was controlled with Pressure. The procedure was tolerated well with a pain level of 0 throughout and a pain level of 0 following the procedure. Post Debridement Measurements: 0.5cm length x 0.4cm width x 2.5cm depth; 0.393cm^3 volume. Character of Wound/Ulcer Post Debridement is improved. Severity of Tissue Post Debridement is: Bone involvement without necrosis. Post procedure Diagnosis Wound #7: Same as Pre-Procedure Plan 1. I would recommend that we go ahead and continue with the wound care measures as before with regard  to appropriate dressings we will switch over to a silver alginate however in place of the endoform. 2. I am also can recommend that we have the patient continue with the ABD pad and roll gauze to secure in place which I think is also a good way to go currently. 3. The patient will be seeing ENT today to see what they can do about his ears and issues he was having with that hopefully they will be able to get this under good control for him. Obviously the main goal here is to try to  get back into the HBO chamber. We will see patient back for reevaluation in 2 weeks here in the clinic. If anything worsens or changes patient will contact our office for additional recommendations. Electronic Signature(s) Signed: 03/18/2021 2:41:42 PM By: Worthy Keeler PA-C Entered By: Worthy Keeler on 03/18/2021 14:41:42 -------------------------------------------------------------------------------- SuperBill Details Patient Name: Date of Service: [name], [name] 03/18/2021 Medical Record Number: [id number] Patient Account Number: [id number] Date of Birth/Sex: Treating RN: 1970/05/05 (50 y.o. [name] Primary Care Provider: [name] Other Clinician: Referring Provider: Treating Provider/Extender: [name], [name] in Treatment: 27 Diagnosis Coding ICD-10 Codes Code Description [phone] Other chronic osteomyelitis, right ankle and foot E11.621 Type 2 diabetes mellitus with foot ulcer L97.514 Non-pressure chronic ulcer of other part of right foot with necrosis of bone T81.31XS Disruption of external operation (surgical) wound, not elsewhere classified, sequela F17.218 Nicotine dependence, cigarettes, with other nicotine-induced disorders L97.522 Non-pressure chronic ulcer of other part of left foot with fat layer exposed L84 Corns and callosities Facility Procedures CPT4 Code: [id number] Description: 24097 - DEB SUBQ TISSUE 20 SQ CM/< ICD-10 Diagnosis Description  L97.514 Non-pressure chronic ulcer of other part of right foot with necrosis of bone Modifier: Quantity: 1 Physician Procedures : CPT4 Code Description Modifier [id number] 11042 - WC PHYS SUBQ TISS 20 SQ CM ICD-10 Diagnosis Description L97.514 Non-pressure chronic ulcer of other part of right foot with necrosis of bone Quantity: 1 Electronic Signature(s) Signed: 03/18/2021 2:41:56 PM By: Worthy Keeler PA-C Entered By: Worthy Keeler on 03/18/2021 14:41:54

## 2021-03-18 NOTE — Progress Notes (Signed)
TAYGEN, ACKLIN ([id number]) Visit Report for 03/18/2021 Arrival Information Details Patient Name: Date of Service: [name], [place] 03/18/2021 1:15 PM Medical Record Number: [id number] Patient Account Number: [id number] Date of Birth/Sex: Treating RN: 04-14-1970 (50 y.o. Collene Gobble Primary Care Leathia Farnell: [name] Other Clinician: Referring Garima Chronis: Treating Michol Emory/Extender: [name], [name] in Treatment: 27 Visit Information History Since Last Visit Added or deleted any medications: No Patient Arrived: [name] Any new allergies or adverse reactions: No Arrival Time: 13:36 Had a fall or experienced change in No Accompanied By: self activities of daily living that may affect Transfer Assistance: None risk of falls: Patient Identification Verified: Yes Signs or symptoms of abuse/neglect since last visito No Patient Requires Transmission-Based Precautions: No Hospitalized since last visit: No Patient Has Alerts: No Implantable device outside of the clinic excluding No cellular tissue based products placed in the center since last visit: Has Dressing in Place as Prescribed: Yes Pain Present Now: No Electronic Signature(s) Signed: 03/18/2021 5:25:22 PM By: Dellie Catholic RN Entered By: Dellie Catholic on 03/18/2021 13:37:01 -------------------------------------------------------------------------------- Encounter Discharge Information Details Patient Name: Date of Service: [name], [place]. 03/18/2021 1:15 PM Medical Record Number: [id number] Patient Account Number: [id number] Date of Birth/Sex: Treating RN: 11/19/1970 (50 y.o. [name] Primary Care Shamekia Tippets: [name] Other Clinician: Referring Yarelin Reichardt: Treating Tavionna Grout/Extender: [name], [name] in Treatment: 27 Encounter Discharge Information Items Post Procedure Vitals Discharge Condition: Stable Temperature (F): 98.4 Ambulatory Status:  Cane Pulse (bpm): 98 Discharge Destination: Home Respiratory Rate (breaths/min): 18 Transportation: Private Auto Blood Pressure (mmHg): 126/83 Accompanied By: self Schedule Follow-up Appointment: Yes Clinical Summary of Care: Patient Declined Electronic Signature(s) Signed: 03/18/2021 6:08:54 PM By: Baruch Gouty RN, BSN Entered By: Baruch Gouty on 03/18/2021 14:53:27 -------------------------------------------------------------------------------- Lower Extremity Assessment Details Patient Name: Date of Service: [name], [name] 03/18/2021 1:15 PM Medical Record Number: [id number] Patient Account Number: [id number] Date of Birth/Sex: Treating RN: 1970-05-11 (50 y.o. Collene Gobble Primary Care Skyann Ganim: [name] Other Clinician: Referring Alferd Obryant: Treating Myan Suit/Extender: [name], [name] in Treatment: 27 Edema Assessment Assessed: [Left: No] [Right: No] Edema: [Left: Yes] [Right: Yes] Calf Left: Right: Point of Measurement: From Medial Instep 32.6 cm Ankle Left: Right: Point of Measurement: From Medial Instep 24.5 cm Electronic Signature(s) Signed: 03/18/2021 5:25:22 PM By: Dellie Catholic RN Entered By: Dellie Catholic on 03/18/2021 13:41:15 -------------------------------------------------------------------------------- Multi-Disciplinary Care Plan Details Patient Name: Date of Service: [name], [place] 03/18/2021 1:15 PM Medical Record Number: [id number] Patient Account Number: [id number] Date of Birth/Sex: Treating RN: [birth year] (50 y.o. [name] Primary Care Everett Ehrler: [name] Other Clinician: Referring Sheron Tallman: Treating Vaibhav Fogleman/Extender: [name], [name] in Treatment: 27 Active Inactive Osteomyelitis Nursing Diagnoses: Infection: osteomyelitis Knowledge deficit related to disease process and management Goals: Patient/caregiver will verbalize understanding of disease process  and disease management Date Initiated: 11/26/2020 Target Resolution Date: 03/25/2021 Goal Status: Active Patient's osteomyelitis will resolve Date Initiated: 11/26/2020 Target Resolution Date: 03/25/2021 Goal Status: Active Interventions: Assess for signs and symptoms of osteomyelitis resolution every visit Provide education on osteomyelitis Screen for HBO Treatment Activities: Systemic antibiotics : 11/26/2020 Notes: 02/25/21 continuiing treatment, ID appointment next week Wound/Skin Impairment Nursing Diagnoses: Impaired tissue integrity Goals: Patient/caregiver will verbalize understanding of skin care regimen Date Initiated: 09/05/2020 Target Resolution Date: 03/25/2021 Goal Status: Active Ulcer/skin breakdown will have a volume reduction of 30% by week 4 Date Initiated: 09/05/2020 Date Inactivated: 10/08/2020  Target Resolution Date: 10/03/2020 Goal Status: Met Interventions: Assess patient/caregiver ability to obtain necessary supplies Assess patient/caregiver ability to perform ulcer/skin care regimen upon admission and as needed Assess ulceration(s) every visit Provide education on ulcer and skin care Treatment Activities: Patient referred to home care : 09/05/2020 Skin care regimen initiated : 09/05/2020 Topical wound management initiated : 09/05/2020 Notes: Electronic Signature(s) Signed: 03/18/2021 6:08:54 PM By: Baruch Gouty RN, BSN Entered By: Baruch Gouty on 03/18/2021 14:23:53 -------------------------------------------------------------------------------- Pain Assessment Details Patient Name: Date of Service: [name], [place] 03/18/2021 1:15 PM Medical Record Number: [id number] Patient Account Number: [id number] Date of Birth/Sex: Treating RN: 10-15-1970 (50 y.o. Collene Gobble Primary Care Navraj Dreibelbis: [name] Other Clinician: Referring Kymberlee Viger: Treating Jonathan Kirkendoll/Extender: [name], [name] in Treatment: 27 Active  Problems Location of Pain Severity and Description of Pain Patient Has Paino No Site Locations Pain Management and Medication Current Pain Management: Electronic Signature(s) Signed: 03/18/2021 5:25:22 PM By: Dellie Catholic RN Entered By: Dellie Catholic on 03/18/2021 13:40:10 -------------------------------------------------------------------------------- Patient/Caregiver Education Details Patient Name: Date of Service: [name], [employer] 12/21/2022andnbsp1:15 PM Medical Record Number: [id number] Patient Account Number: [id number] Date of Birth/Gender: Treating RN: April 06, 1970 (50 y.o. [name] Primary Care Physician: [name] Other Clinician: Referring Physician: Treating Physician/Extender: [name], [name] in Treatment: 27 Education Assessment Education Provided To: Patient Education Topics Provided Hyperbaric Oxygenation: Methods: Explain/Verbal Responses: Reinforcements needed, State content correctly Infection: Methods: Explain/Verbal Responses: Reinforcements needed, State content correctly Wound/Skin Impairment: Methods: Explain/Verbal Responses: Reinforcements needed, State content correctly Electronic Signature(s) Signed: 03/18/2021 6:08:54 PM By: Baruch Gouty RN, BSN Entered By: Baruch Gouty on 03/18/2021 14:24:38 -------------------------------------------------------------------------------- Wound Assessment Details Patient Name: Date of Service: [name], [name] 03/18/2021 1:15 PM Medical Record Number: [id number] Patient Account Number: [id number] Date of Birth/Sex: Treating RN: [birth year] (50 y.o. Collene Gobble Primary Care Syanna Remmert: [name] Other Clinician: Referring Shanyia Stines: Treating Sherran Margolis/Extender: [name], [name] in Treatment: 27 Wound Status Wound Number: 3 Primary Diabetic Wound/Ulcer of the Lower Extremity Etiology: Wound Location: Right, Proximal, Lateral  Foot Wound Open Wounding Event: Gradually Appeared Status: Date Acquired: 10/29/2020 Comorbid Anemia, Coronary Artery Disease, Hypertension, Type II Weeks Of Treatment: 20 History: Diabetes, Osteomyelitis, Neuropathy Clustered Wound: No Photos Wound Measurements Length: (cm) 1 Width: (cm) 0.2 Depth: (cm) 0.9 Area: (cm) 0.157 Volume: (cm) 0.141 % Reduction in Area: -24.6% % Reduction in Volume: -60.2% Epithelialization: None Tunneling: Yes Position (o'clock): 7 Maximum Distance: (cm) 2 Undermining: No Wound Description Classification: Grade 3 Exudate Amount: Medium Exudate Type: Serosanguineous Exudate Color: red, brown Foul Odor After Cleansing: No Slough/Fibrino Yes Wound Bed Granulation Amount: None Present (0%) Exposed Structure Necrotic Amount: Large (67-100%) Fascia Exposed: No Necrotic Quality: Adherent Slough Fat Layer (Subcutaneous Tissue) Exposed: Yes Tendon Exposed: No Muscle Exposed: No Joint Exposed: No Bone Exposed: No Treatment Notes Wound #3 (Foot) Wound Laterality: Right, Lateral, Proximal Cleanser Soap and Water Discharge Instruction: May shower and wash wound with dial antibacterial soap and water prior to dressing change. Wound Cleanser Discharge Instruction: Cleanse the wound with wound cleanser prior to applying a clean dressing using gauze sponges, not tissue or cotton balls. Peri-Wound Care Sween Lotion (Moisturizing lotion) Discharge Instruction: Apply moisturizing lotion to foot with dressing changes Topical Primary Dressing KerraCel Ag Gelling Fiber Dressing, 2x2 in (silver alginate) Discharge Instruction: Apply silver alginate to wound bed pack into tunnels Secondary Dressing Woven Gauze Sponge, Non-Sterile 4x4 in Discharge Instruction: Apply over primary  dressing as directed. ABD Pad, 5x9 Discharge Instruction: Apply over primary dressing as directed. Secured With Compression Wrap Kerlix Roll 4.5x3.1 (in/yd) Discharge  Instruction: Apply Kerlix and Coban compression as directed. Coban Self-Adherent Wrap 4x5 (in/yd) Discharge Instruction: Apply over Kerlix as directed. Compression Stockings Add-Ons Electronic Signature(s) Signed: 03/18/2021 5:25:22 PM By: Dellie Catholic RN Signed: 03/18/2021 6:08:54 PM By: Baruch Gouty RN, BSN Entered By: Baruch Gouty on 03/18/2021 14:29:37 -------------------------------------------------------------------------------- Wound Assessment Details Patient Name: Date of Service: [name], [name] 03/18/2021 1:15 PM Medical Record Number: [id number] Patient Account Number: [id number] Date of Birth/Sex: Treating RN: Sep 03, 1970 (49 y.o. [name] Primary Care Rory Montel: [name] Other Clinician: Referring Cortina Vultaggio: Treating Ronnita Paz/Extender: [name], [name] in Treatment: 27 Wound Status Wound Number: 7 Primary Diabetic Wound/Ulcer of the Lower Extremity Etiology: Wound Location: Right, Lateral, Plantar Foot Wound Open Wounding Event: Gradually Appeared Status: Date Acquired: 12/31/2020 Comorbid Anemia, Coronary Artery Disease, Hypertension, Type II Weeks Of Treatment: 11 History: Diabetes, Osteomyelitis, Neuropathy Clustered Wound: No Photos Wound Measurements Length: (cm) 0.5 Width: (cm) 0.4 Depth: (cm) 2.5 Area: (cm) 0.157 Volume: (cm) 0.393 % Reduction in Area: 19.9% % Reduction in Volume: -100.5% Epithelialization: None Tunneling: No Undermining: No Wound Description Classification: Grade 3 Exudate Amount: Medium Exudate Type: Serosanguineous Exudate Color: red, brown Foul Odor After Cleansing: No Slough/Fibrino Yes Wound Bed Granulation Amount: None Present (0%) Exposed Structure Necrotic Amount: Large (67-100%) Fascia Exposed: No Fat Layer (Subcutaneous Tissue) Exposed: Yes Tendon Exposed: No Muscle Exposed: No Joint Exposed: No Bone Exposed: Yes Treatment Notes Wound #7 (Foot) Wound  Laterality: Plantar, Right, Lateral Cleanser Soap and Water Discharge Instruction: May shower and wash wound with dial antibacterial soap and water prior to dressing change. Wound Cleanser Discharge Instruction: Cleanse the wound with wound cleanser prior to applying a clean dressing using gauze sponges, not tissue or cotton balls. Peri-Wound Care Sween Lotion (Moisturizing lotion) Discharge Instruction: Apply moisturizing lotion to foot with dressing changes Topical Primary Dressing KerraCel Ag Gelling Fiber Dressing, 2x2 in (silver alginate) Discharge Instruction: Apply silver alginate to wound bed pack into tunnels Secondary Dressing Woven Gauze Sponge, Non-Sterile 4x4 in Discharge Instruction: Apply over primary dressing as directed. ABD Pad, 5x9 Discharge Instruction: Apply over primary dressing as directed. Secured With Compression Wrap Kerlix Roll 4.5x3.1 (in/yd) Discharge Instruction: Apply Kerlix and Coban compression as directed. Coban Self-Adherent Wrap 4x5 (in/yd) Discharge Instruction: Apply over Kerlix as directed. Compression Stockings Add-Ons Electronic Signature(s) Signed: 03/18/2021 6:08:54 PM By: Baruch Gouty RN, BSN Entered By: Baruch Gouty on 03/18/2021 14:30:43 -------------------------------------------------------------------------------- Vitals Details Patient Name: Date of Service: [name], [name] 03/18/2021 1:15 PM Medical Record Number: [id number] Patient Account Number: [id number] Date of Birth/Sex: Treating RN: 05-Jun-1970 (50 y.o. Collene Gobble Primary Care Nazareth Norenberg: [name] Other Clinician: Referring Alazay Leicht: Treating Nelta Caudill/Extender: [name], [name] in Treatment: 27 Vital Signs Time Taken: 13:37 Temperature (F): 98.4 Height (in): 65 Pulse (bpm): 98 Weight (lbs): 196 Respiratory Rate (breaths/min): 18 Body Mass Index (BMI): 32.6 Blood Pressure (mmHg): 126/83 Reference Range: 80 - 120 mg /  dl Electronic Signature(s) Signed: 03/18/2021 5:25:22 PM By: Dellie Catholic RN Entered By: Dellie Catholic on 03/18/2021 13:39:12

## 2021-03-19 ENCOUNTER — Encounter (HOSPITAL_BASED_OUTPATIENT_CLINIC_OR_DEPARTMENT_OTHER): Payer: 59 | Admitting: Internal Medicine

## 2021-03-19 DIAGNOSIS — E1169 Type 2 diabetes mellitus with other specified complication: Secondary | ICD-10-CM | POA: Diagnosis not present

## 2021-03-19 LAB — GLUCOSE, CAPILLARY
Glucose-Capillary: 179 mg/dL — ABNORMAL HIGH (ref 70–99)
Glucose-Capillary: 123 mg/dL — ABNORMAL HIGH (ref 70–99)

## 2021-03-19 NOTE — Progress Notes (Signed)
[name], [name] ([id number]) Visit Report for 03/19/2021 Arrival Information Details Patient Name: Date of Service: [place] RD, [name] 03/19/2021 10:00 A M Medical Record Number: [id number] Patient Account Number: [id number] Date of Birth/Sex: Treating RN: April 01, 1970 (50 y.o. [name], Meta.Reding Primary Care Xaden Kaufman: [name] Other Clinician: [name] Referring Nakul Avino: Treating Aubrey Blackard/Extender: [name], [name] in Treatment: 27 Visit Information History Since Last Visit All ordered tests and consults were completed: Yes Patient Arrived: [name] Added or deleted any medications: No Arrival Time: 09:59 Any new allergies or adverse reactions: No Accompanied By: self Had a fall or experienced change in No Transfer Assistance: None activities of daily living that may affect Patient Identification Verified: Yes risk of falls: Secondary Verification Process Completed: Yes Signs or symptoms of abuse/neglect since last visito No Patient Requires Transmission-Based Precautions: No Hospitalized since last visit: No Patient Has Alerts: No Implantable device outside of the clinic excluding No cellular tissue based products placed in the center since last visit: Pain Present Now: No Electronic Signature(s) Signed: 03/19/2021 2:07:34 PM By: [name] [name] , , Entered By: [name] on 03/19/2021 14:07:33 -------------------------------------------------------------------------------- Encounter Discharge Information Details Patient Name: Date of Service: [name] RD, [name] 03/19/2021 10:00 A M Medical Record Number: [id number] Patient Account Number: [id number] Date of Birth/Sex: Treating RN: 10-Oct-1970 (50 y.o. [name] Primary Care Atif Chapple: [name] Other Clinician: [name] Referring Anja Neuzil: Treating Nycholas Rayner/Extender: [name], [name] in Treatment: 27 Encounter Discharge  Information Items Discharge Condition: Stable Ambulatory Status: Cane Discharge Destination: Home Transportation: Private Auto Accompanied By: self Schedule Follow-up Appointment: No Clinical Summary of Care: Electronic Signature(s) Signed: 03/19/2021 2:13:18 PM By: [name] [name] , , Entered By: [name] on 03/19/2021 14:13:18 -------------------------------------------------------------------------------- [place] Details Patient Name: Date of Service: [name] RD, [name] 03/19/2021 10:00 A M Medical Record Number: [id number] Patient Account Number: [id number] Date of Birth/Sex: Treating RN: [birth year] (50 y.o. [name], [name] Primary Care Seraphim Trow: [name] Other Clinician: [name] Referring [name]: Treating Shakir Petrosino/Extender: [name], [name] in Treatment: 27 Vital Signs Time Taken: 10:13 Temperature (F): 98.6 Height (in): 65 Pulse (bpm): 99 Weight (lbs): 196 Respiratory Rate (breaths/min): 18 Body Mass Index (BMI): 32.6 Blood Pressure (mmHg): 128/83 Capillary Blood Glucose (mg/dl): 179 Reference Range: 80 - 120 mg / dl Electronic Signature(s) Signed: 03/19/2021 2:08:12 PM By: [name] [name] , , Entered By: [name] on 03/19/2021 14:08:12

## 2021-03-19 NOTE — Progress Notes (Signed)
QUSAY, VILLADA ([id number]) Visit Report for 03/19/2021 SuperBill Details Patient Name: Date of Service: [place] RD, [name] 03/19/2021 Medical Record Number: [id number] Patient Account Number: [id number] Date of Birth/Sex: Treating RN: December 15, 1970 (50 y.o. [name], [name] Primary Care Provider: [name] Other Clinician: [name] Referring Provider: Treating Provider/Extender: [name], [name] in Treatment: 27 Diagnosis Coding ICD-10 Codes Code Description [phone] Other chronic osteomyelitis, right ankle and foot E11.621 Type 2 diabetes mellitus with foot ulcer L97.514 Non-pressure chronic ulcer of other part of right foot with necrosis of bone T81.31XS Disruption of external operation (surgical) wound, not elsewhere classified, sequela F17.218 Nicotine dependence, cigarettes, with other nicotine-induced disorders L97.522 Non-pressure chronic ulcer of other part of left foot with fat layer exposed L84 Corns and callosities Facility Procedures CPT4 Code Description Modifier Quantity [id number]-(Facility Use Only) HBOT full body chamber, 31min , 4 ICD-10 Diagnosis Description E11.621 Type 2 diabetes mellitus with foot ulcer L97.514 Non-pressure chronic ulcer of other part of right foot with necrosis of bone M86.671 Other chronic osteomyelitis, right ankle and foot Physician Procedures Quantity CPT4 Code Description Modifier [id number] - WC PHYS HYPERBARIC OXYGEN THERAPY 1 ICD-10 Diagnosis Description E11.621 Type 2 diabetes mellitus with foot ulcer L97.514 Non-pressure chronic ulcer of other part of right foot with necrosis of bone M86.671 Other chronic osteomyelitis, right ankle and foot Electronic Signature(s) Signed: 03/19/2021 2:12:36 PM By: [name] [name] , , Signed: 03/19/2021 3:43:54 PM By: [name] MD Entered By: [name] on 03/19/2021 14:12:34

## 2021-03-19 NOTE — Progress Notes (Addendum)
PAYNE, GARSKE ([id number]) Visit Report for 03/19/2021 HBO Details Patient Name: Date of Service: [place] RD, [name] 03/19/2021 10:00 A M Medical Record Number: [id number] Patient Account Number: [id number] Date of Birth/Sex: Treating RN: [birth year] (50 y.o. [name], [name] Primary Care Danille Oppedisano: [name] Other Clinician: [name] Referring Tyrail Grandfield: Treating Chane Magner/Extender: [name], [name] in Treatment: 27 HBO Treatment Course Details Treatment Course Number: 1 Ordering Abubakar Crispo: [name] Treatments Ordered: otal 60 HBO Treatment Start Date: 12/08/2020 HBO Indication: Diabetic Ulcer(s) of the Lower Extremity HBO Treatment Details Treatment Number: 42 Patient Type: Outpatient Chamber Type: Monoplace Chamber Serial #: [id number] Treatment Protocol: 2.5 ATA with 90 minutes oxygen, with two 5 minute air breaks Treatment Details Compression Rate Down: 2.0 psi / minute De-Compression Rate Up: 2.0 psi / minute A breaks and breathing ir Compress Tx Pressure periods Decompress Decompress Begins Reached (leave unused spaces Begins Ends blank) Chamber Pressure (ATA 1 2.5 2.5 2.5 2.5 2.5 - - 2.5 1 ) Clock Time (24 hr) 10:26 10:45 11:15 11:20 11:50 11:55 - - 12:25 12:36 Treatment Length: 130 (minutes) Treatment Segments: 4 Vital Signs Capillary Blood Glucose Reference Range: 80 - 120 mg / dl HBO Diabetic Blood Glucose Intervention Range: <131 mg/dl or >249 mg/dl Type: Time Vitals Blood Pulse: Respiratory Temperature: Capillary Blood Glucose Pulse Action Taken: Pressure: Rate: Glucose (mg/dl): Meter #: Oximetry (%) Taken: Pre 10:13 128/83 99 18 98.6 179 Post 12:39 128/84 92 18 98.4 123 > 110 mg/dl, proceed with HBO per protocol Treatment Response Treatment Toleration: Well Treatment Completion Status: Treatment Completed without Adverse Event Additional Procedure Documentation Tissue Sevierity: Bone involvement without  necrosis Dunya Meiners Notes Patient returns to clinic. He has bilateral myringotomy tubes tolerated treatment well Physician HBO Attestation: I certify that I supervised this HBO treatment in accordance with Medicare guidelines. A trained emergency response team is readily available per Yes hospital policies and procedures. Continue HBOT as ordered. Yes Electronic Signature(s) Signed: 03/19/2021 3:43:54 PM By: [name] MD Previous Signature: 03/19/2021 2:11:49 PM Version By: [name] [name] , , Entered By: [name] on 03/19/2021 15:40:33 -------------------------------------------------------------------------------- HBO Safety Checklist Details Patient Name: Date of Service: [name], [name] 03/19/2021 10:00 A M Medical Record Number: [id number] Patient Account Number: [id number] Date of Birth/Sex: Treating RN: 1970-11-09 (50 y.o. [name], [name] Primary Care Humna Moorehouse: [name] Other Clinician: [name] Referring Richie Bonanno: Treating Malavika Lira/Extender: [name], [name] in Treatment: 27 HBO Safety Checklist Items Safety Checklist Consent Form Signed Patient voided / foley secured and emptied When did you last eato 0700 Last dose of injectable or oral agent 0700 Ostomy pouch emptied and vented if applicable NA All implantable devices assessed, documented and approved NA Intravenous access site secured and place NA Valuables secured Linens and cotton and cotton/polyester blend (less than 51% polyester) Personal oil-based products / skin lotions / body lotions removed Wigs or hairpieces removed NA Smoking or tobacco materials removed NA Books / newspapers / magazines / loose paper removed Cologne, aftershave, perfume and deodorant removed Jewelry removed (may wrap wedding band) Make-up removed NA Hair care products removed Battery operated devices (external) removed Heating patches and chemical warmers  removed Titanium eyewear removed NA Nail polish cured greater than 10 hours Casting material cured greater than 10 hours Hearing aids removed NA Loose dentures or partials removed NA Prosthetics have been removed NA Patient demonstrates correct use of air break device (if applicable) Patient concerns have been addressed Patient  grounding bracelet on and cord attached to chamber Specifics for Inpatients (complete in addition to above) Medication sheet sent with patient NA Intravenous medications needed or due during therapy sent with patient NA Drainage tubes (e.g. nasogastric tube or chest tube secured and vented) NA Endotracheal or Tracheotomy tube secured NA Cuff deflated of air and inflated with saline NA Airway suctioned NA Notes Paper version used prior to treatment. Electronic Signature(s) Signed: 03/19/2021 5:37:16 PM By: [name] [name] , , Previous Signature: 03/19/2021 2:09:55 PM Version By: [name] [name] , , Entered By: [name] on 03/19/2021 17:37:16

## 2021-03-20 ENCOUNTER — Encounter (HOSPITAL_BASED_OUTPATIENT_CLINIC_OR_DEPARTMENT_OTHER): Payer: 59 | Admitting: Internal Medicine

## 2021-03-20 ENCOUNTER — Other Ambulatory Visit: Payer: Medicaid Other

## 2021-03-20 ENCOUNTER — Ambulatory Visit: Payer: Medicaid Other | Admitting: Physician Assistant

## 2021-03-24 ENCOUNTER — Encounter (HOSPITAL_BASED_OUTPATIENT_CLINIC_OR_DEPARTMENT_OTHER): Payer: 59 | Admitting: Internal Medicine

## 2021-03-24 ENCOUNTER — Ambulatory Visit (INDEPENDENT_AMBULATORY_CARE_PROVIDER_SITE_OTHER): Payer: 59 | Admitting: Podiatry

## 2021-03-24 ENCOUNTER — Other Ambulatory Visit: Payer: Self-pay

## 2021-03-24 DIAGNOSIS — L97514 Non-pressure chronic ulcer of other part of right foot with necrosis of bone: Secondary | ICD-10-CM

## 2021-03-24 DIAGNOSIS — L97521 Non-pressure chronic ulcer of other part of left foot limited to breakdown of skin: Secondary | ICD-10-CM

## 2021-03-25 ENCOUNTER — Encounter (HOSPITAL_BASED_OUTPATIENT_CLINIC_OR_DEPARTMENT_OTHER): Payer: 59 | Admitting: Internal Medicine

## 2021-03-25 DIAGNOSIS — E1169 Type 2 diabetes mellitus with other specified complication: Secondary | ICD-10-CM | POA: Diagnosis not present

## 2021-03-25 LAB — GLUCOSE, CAPILLARY
Glucose-Capillary: 235 mg/dL — ABNORMAL HIGH (ref 70–99)
Glucose-Capillary: 195 mg/dL — ABNORMAL HIGH (ref 70–99)

## 2021-03-25 NOTE — Progress Notes (Signed)
JAHMANI, STAUP ([id number]) Visit Report for 03/25/2021 HBO Details Patient Name: Date of Service: [address] RD, [name] 03/25/2021 10:00 A M Medical Record Number: [id number] Patient Account Number: [id number] Date of Birth/Sex: Treating RN: Mar 17, 1971 (50 y.o. [name] Primary Care Acadia Thammavong: [name] Other Clinician: Referring Gibson Telleria: Treating Tayten Heber/Extender: [name], [name] in Treatment: 28 HBO Treatment Course Details Treatment Course Number: 1 Ordering Brynleigh Sequeira: [name] Treatments Ordered: otal 60 HBO Treatment Start Date: 12/08/2020 HBO Indication: Diabetic Ulcer(s) of the Lower Extremity HBO Treatment Details Treatment Number: 43 Patient Type: Outpatient Chamber Type: Monoplace Chamber Serial #: [id number] Treatment Protocol: 2.0 ATA with 90 minutes oxygen, and no air breaks Treatment Details Compression Rate Down: 2.0 psi / minute De-Compression Rate Up: 2.0 psi / minute Air breaks and breathing Decompress Decompress Compress Tx Pressure Begins Reached periods Begins Ends (leave unused spaces blank) Chamber Pressure (ATA 1 2 ------2 1 ) Clock Time (24 hr) 10:21 10:28 - - - - - - 11:58 12:07 Treatment Length: 106 (minutes) Treatment Segments: 4 Vital Signs Capillary Blood Glucose Reference Range: 80 - 120 mg / dl HBO Diabetic Blood Glucose Intervention Range: <131 mg/dl or >249 mg/dl Time Vitals Blood Respiratory Capillary Blood Glucose Pulse Action Type: Pulse: Temperature: Taken: Pressure: Rate: Glucose (mg/dl): Meter #: Oximetry (%) Taken: Pre 09:43 148/80 87 20 98 235 Post 12:08 140/86 84 16 98 195 Treatment Response Treatment Toleration: Well Treatment Completion Status: Treatment Completed without Adverse Event Treatment Notes today treatment at 2.0 ATA with no air breaks, MD aware. Additional Procedure Documentation Tissue Sevierity: Bone involvement without necrosis Kyesha Balla Notes No concerns  with treatment given. Patient was treated at 2 atm instead of 2.5 atm Physician HBO Attestation: I certify that I supervised this HBO treatment in accordance with Medicare guidelines. A trained emergency response team is readily available per Yes hospital policies and procedures. Continue HBOT as ordered. Yes Electronic Signature(s) Signed: 03/25/2021 3:46:55 PM By: [name] MD Previous Signature: 03/25/2021 3:01:11 PM Version By: [name] RN, BSN Entered By: [name] on 03/25/2021 15:30:38 -------------------------------------------------------------------------------- HBO Safety Checklist Details Patient Name: Date of Service: [name], [place] [name] 03/25/2021 10:00 A M Medical Record Number: [id number] Patient Account Number: [id number] Date of Birth/Sex: Treating RN: 1970-05-03 (50 y.o. [name], [name] Primary Care Amaru Burroughs: [name] Other Clinician: Referring Diesel Lina: Treating Demaryius Imran/Extender: [name], [name] in Treatment: 28 HBO Safety Checklist Items Safety Checklist Consent Form Signed Patient voided / foley secured and emptied When did you last eato 0700 sausage biscuit Last dose of injectable or oral agent 0700 Ostomy pouch emptied and vented if applicable NA All implantable devices assessed, documented and approved NA Intravenous access site secured and place NA Valuables secured Linens and cotton and cotton/polyester blend (less than 51% polyester) Personal oil-based products / skin lotions / body lotions removed Wigs or hairpieces removed NA Smoking or tobacco materials removed Books / newspapers / magazines / loose paper removed Cologne, aftershave, perfume and deodorant removed Jewelry removed (may wrap wedding band) NA Make-up removed NA Hair care products removed NA Battery operated devices (external) removed NA Heating patches and chemical warmers removed NA Titanium eyewear removed NA Nail polish  cured greater than 10 hours NA Casting material cured greater than 10 hours NA Hearing aids removed NA Loose dentures or partials removed NA Prosthetics have been removed NA Patient demonstrates correct use of air break device (if applicable) Patient concerns have been addressed Patient grounding  bracelet on and cord attached to chamber Specifics for Inpatients (complete in addition to above) Medication sheet sent with patient Intravenous medications needed or due during therapy sent with patient Drainage tubes (e.g. nasogastric tube or chest tube secured and vented) Endotracheal or Tracheotomy tube secured Cuff deflated of air and inflated with saline Airway suctioned Electronic Signature(s) Signed: 03/25/2021 3:01:11 PM By: [name] RN, BSN Entered By: [name] on 03/25/2021 10:53:55

## 2021-03-25 NOTE — Progress Notes (Addendum)
[name], [name] ([id number]) Visit Report for [name] Physician Orders Details Patient Name: Date of Service: [name] RD, [name] [facility] [name] 10:00 A M Medical Record Number: [id number] Patient Account Number: [id number] Date of Birth/Sex: Treating RN: 1970-04-06 (50 y.o. [name] Primary Care Provider: Durene [name] Other Clinician: Referring Provider: Treating Provider/Extender: [name], [name] in Treatment: 28 Verbal / Phone Orders: No Diagnosis Coding ICD-10 Coding Code Description [phone] Other chronic osteomyelitis, right ankle and foot E11.621 Type 2 diabetes mellitus with foot ulcer L97.514 Non-pressure chronic ulcer of other part of right foot with necrosis of bone T81.31XS Disruption of external operation (surgical) wound, not elsewhere classified, sequela F17.218 Nicotine dependence, cigarettes, with other nicotine-induced disorders L97.522 Non-pressure chronic ulcer of other part of left foot with fat layer exposed L84 Corns and callosities Hyperbaric Oxygen Therapy Indication: - Wagner Grade 3 diabetic right foot 2.0 ATA for 90 Minutes without A Breaks - for only today, [name], then continue with treatments at 2.5 ATA for 90 minutes with 2 Five minute ir air breaks on 03/26/2021. Finger stick Blood Glucose Pre- and Post- HBOT Treatment. Follow Hyperbaric Oxygen Glycemia Protocol Wound Treatment GLYCEMIA INTERVENTIONS PROTOCOL PRE-HBO GLYCEMIA INTERVENTIONS ACTION INTERVENTION Obtain pre-HBO capillary blood glucose (ensure 1 physician order is in chart). A. Notify HBO physician and await physician orders. 2 If result is 70 mg/dl or below: B. If the result meets the hospital definition of a critical result, follow hospital policy. A. Give patient an 8 ounce Glucerna Shake, an 8 ounce Ensure, or 8 ounces of a Glucerna/Ensure equivalent dietary supplement*. B. Wait 30 minutes. If result is 71 mg/dl to 130 mg/dl: C. Retest  patients capillary blood glucose (CBG). D. If result greater than or equal to 110 mg/dl, proceed with HBO. If result less than 110 mg/dl, notify HBO physician and consider holding HBO. If result is 131 mg/dl to 249 mg/dl: A. Proceed with HBO. A. Notify HBO physician and await physician orders. B. It is recommended to hold HBO and do If result is 250 mg/dl or greater: blood/urine ketone testing. C. If the result meets the hospital definition of a critical result, follow hospital policy. POST-HBO GLYCEMIA INTERVENTIONS ACTION INTERVENTION Obtain post HBO capillary blood glucose (ensure 1 physician order is in chart). A. Notify HBO physician and await physician orders. 2 If result is 70 mg/dl or below: B. If the result meets the hospital definition of a critical result, follow hospital policy. A. Give patient an 8 ounce Glucerna Shake, an 8 ounce Ensure, or 8 ounces of a Glucerna/Ensure equivalent dietary supplement*. B. Wait 15 minutes for symptoms of If result is 71 mg/dl to 100 mg/dl: hypoglycemia (i.e. nervousness, anxiety, sweating, chills, clamminess, irritability, confusion, tachycardia or dizziness). C. If patient asymptomatic, discharge patient. If patient symptomatic, repeat capillary blood glucose (CBG) and notify HBO physician. If result is 101 mg/dl to 249 mg/dl: A. Discharge patient. A. Notify HBO physician and await physician orders. B. It is recommended to do blood/urine ketone If result is 250 mg/dl or greater: testing. C. If the result meets the hospital definition of a critical result, follow hospital policy. *Juice or candies are NOT equivalent products. If patient refuses the Glucerna or Ensure, please consult the hospital dietitian for an appropriate substitute. Electronic Signature(s) Signed: 03/25/2021 8:14:03 PM By: Joseph Pilling RN, BSN Signed: 03/26/2021 5:16:25 PM By: Joseph Ham MD Entered By: [name] on [name]  20:13:42 -------------------------------------------------------------------------------- Problem List Details Patient Name: Date of Service: [name] RD, [place] [facility]  [name] 10:00 A M Medical Record Number: [id number] Patient Account Number: [id number] Date of Birth/Sex: Treating RN: [birth year] (50 y.o. [name], [name] Primary Care Provider: Durene [name] Other Clinician: Referring Provider: Treating Provider/Extender: [name], [name] in Treatment: 28 Active Problems ICD-10 Encounter Code Description Active Date MDM Diagnosis [phone] Other chronic osteomyelitis, right ankle and foot 09/05/2020 No Yes E11.621 Type 2 diabetes mellitus with foot ulcer 09/05/2020 No Yes L97.514 Non-pressure chronic ulcer of other part of right foot with necrosis of bone 09/05/2020 No Yes T81.31XS Disruption of external operation (surgical) wound, not elsewhere classified, 09/05/2020 No Yes sequela F17.218 Nicotine dependence, cigarettes, with other nicotine-induced disorders 11/26/2020 No Yes L97.522 Non-pressure chronic ulcer of other part of left foot with fat layer exposed 10/22/2020 No Yes L84 Corns and callosities 01/28/2021 No Yes Inactive Problems Resolved Problems Electronic Signature(s) Signed: 03/25/2021 8:14:03 PM By: Joseph Pilling RN, BSN Signed: 03/26/2021 5:16:25 PM By: Joseph Ham MD Entered By: [name] on [name] 20:07:08 -------------------------------------------------------------------------------- SuperBill Details Patient Name: Date of Service: [name] RD, [place] [facility] [name] Medical Record Number: [id number] Patient Account Number: [id number] Date of Birth/Sex: Treating RN: October 08, 1970 (50 y.o. [name] Primary Care Provider: Durene [name] Other Clinician: Referring Provider: Treating Provider/Extender: [name], [name] in Treatment: 28 Diagnosis Coding ICD-10 Codes Code Description [phone] Other chronic  osteomyelitis, right ankle and foot E11.621 Type 2 diabetes mellitus with foot ulcer L97.514 Non-pressure chronic ulcer of other part of right foot with necrosis of bone T81.31XS Disruption of external operation (surgical) wound, not elsewhere classified, sequela F17.218 Nicotine dependence, cigarettes, with other nicotine-induced disorders L97.522 Non-pressure chronic ulcer of other part of left foot with fat layer exposed L84 Corns and callosities Facility Procedures CPT4 Code: 30092330 Description: G0277-(Facility Use Only) HBOT full body chamber, 67min , Modifier: Quantity: 4 Physician Procedures : CPT4 Code Description Modifier [id number] - WC PHYS HYPERBARIC OXYGEN THERAPY ICD-10 Diagnosis Description E11.621 Type 2 diabetes mellitus with foot ulcer L97.514 Non-pressure chronic ulcer of other part of right foot with necrosis of bone M86.671  Other chronic osteomyelitis, right ankle and foot Quantity: 1 Electronic Signature(s) Signed: 03/25/2021 3:01:11 PM By: Joseph Pilling RN, BSN Signed: 03/25/2021 3:46:55 PM By: Joseph Ham MD Entered By: [name] on [name] 12:29:59

## 2021-03-25 NOTE — Progress Notes (Addendum)
RIGLEY, NIESS ([id number]) Visit Report for 03/25/2021 Arrival Information Details Patient Name: Date of Service: [place] [address], [name] 03/25/2021 12:30 PM Medical Record Number: [id number] Patient Account Number: [id number] Date of Birth/Sex: Treating RN: 1970/10/14 (50 y.o. [name] Primary Care Hadlie Gipson: [name] Other Clinician: Referring Trai Ells: Treating Niccolas Loeper/Extender: [name], [name] in Treatment: 28 Visit Information History Since Last Visit Added or deleted any medications: No Patient Arrived: [name] Any new allergies or adverse reactions: No Arrival Time: 12:23 Had a fall or experienced change in No Transfer Assistance: None activities of daily living that may affect Patient Identification Verified: Yes risk of falls: Secondary Verification Process Completed: Yes Signs or symptoms of abuse/neglect since last visito No Patient Requires Transmission-Based Precautions: No Hospitalized since last visit: No Patient Has Alerts: No Implantable device outside of the clinic excluding No cellular tissue based products placed in the center since last visit: Has Dressing in Place as Prescribed: Yes Pain Present Now: No Electronic Signature(s) Signed: 03/25/2021 12:25:16 PM By: Lorrin Jackson Entered By: Lorrin Jackson on 03/25/2021 12:25:15 -------------------------------------------------------------------------------- Clinic Level of Care Assessment Details Patient Name: Date of Service: [place] [address], [place] [name] 03/25/2021 12:30 PM Medical Record Number: [id number] Patient Account Number: [id number] Date of Birth/Sex: Treating RN: 15-Jul-1970 (50 y.o. [name] Primary Care Welby Montminy: [name] Other Clinician: Referring Vicci Reder: Treating Marnita Poirier/Extender: [name], [name] in Treatment: 28 Clinic Level of Care Assessment Items TOOL 4 Quantity Score X- 1 0 Use when only an EandM is performed on FOLLOW-UP  visit ASSESSMENTS - Nursing Assessment / Reassessment X- 1 10 Reassessment of Co-morbidities (includes updates in patient status) X- 1 5 Reassessment of Adherence to Treatment Plan ASSESSMENTS - Wound and Skin A ssessment / Reassessment []  - 0 Simple Wound Assessment / Reassessment - one wound X- 2 5 Complex Wound Assessment / Reassessment - multiple wounds []  - 0 Dermatologic / Skin Assessment (not related to wound area) ASSESSMENTS - Focused Assessment []  - 0 Circumferential Edema Measurements - multi extremities []  - 0 Nutritional Assessment / Counseling / Intervention []  - 0 Lower Extremity Assessment (monofilament, tuning fork, pulses) []  - 0 Peripheral Arterial Disease Assessment (using hand held doppler) ASSESSMENTS - Ostomy and/or Continence Assessment and Care []  - 0 Incontinence Assessment and Management []  - 0 Ostomy Care Assessment and Management (repouching, etc.) PROCESS - Coordination of Care []  - 0 Simple Patient / Family Education for ongoing care X- 1 20 Complex (extensive) Patient / Family Education for ongoing care []  - 0 Staff obtains Programmer, systems, Records, T Results / Process Orders est []  - 0 Staff telephones HHA, Nursing Homes / Clarify orders / etc []  - 0 Routine Transfer to another Facility (non-emergent condition) []  - 0 Routine Hospital Admission (non-emergent condition) []  - 0 New Admissions / Biomedical engineer / Ordering NPWT Apligraf, etc. , []  - 0 Emergency Hospital Admission (emergent condition) []  - 0 Simple Discharge Coordination []  - 0 Complex (extensive) Discharge Coordination PROCESS - Special Needs []  - 0 Pediatric / Minor Patient Management []  - 0 Isolation Patient Management []  - 0 Hearing / Language / Visual special needs []  - 0 Assessment of Community assistance (transportation, D/C planning, etc.) []  - 0 Additional assistance / Altered mentation []  - 0 Support Surface(s) Assessment (bed, cushion, seat,  etc.) INTERVENTIONS - Wound Cleansing / Measurement []  - 0 Simple Wound Cleansing - one wound X- 2 5 Complex Wound Cleansing - multiple wounds []  - 0 Wound Imaging (photographs -  any number of wounds) []  - 0 Wound Tracing (instead of photographs) []  - 0 Simple Wound Measurement - one wound X- 2 5 Complex Wound Measurement - multiple wounds INTERVENTIONS - Wound Dressings []  - 0 Small Wound Dressing one or multiple wounds []  - 0 Medium Wound Dressing one or multiple wounds X- 1 20 Large Wound Dressing one or multiple wounds []  - 0 Application of Medications - topical []  - 0 Application of Medications - injection INTERVENTIONS - Miscellaneous []  - 0 External ear exam []  - 0 Specimen Collection (cultures, biopsies, blood, body fluids, etc.) []  - 0 Specimen(s) / Culture(s) sent or taken to Lab for analysis []  - 0 Patient Transfer (multiple staff / Civil Service fast streamer / Similar devices) []  - 0 Simple Staple / Suture removal (25 or less) []  - 0 Complex Staple / Suture removal (26 or more) []  - 0 Hypo / Hyperglycemic Management (close monitor of Blood Glucose) []  - 0 Ankle / Brachial Index (ABI) - do not check if billed separately X- 1 5 Vital Signs Has the patient been seen at the hospital within the last three years: Yes Total Score: 90 Level Of Care: New/Established - Level 3 Electronic Signature(s) Signed: 03/25/2021 3:30:35 PM By: Lorrin Jackson Entered By: Lorrin Jackson on 03/25/2021 12:48:34 -------------------------------------------------------------------------------- Encounter Discharge Information Details Patient Name: Date of Service: [name], [name] 03/25/2021 12:30 PM Medical Record Number: [id number] Patient Account Number: [id number] Date of Birth/Sex: Treating RN: 08/31/1970 (50 y.o. [name] Primary Care Trenity Pha: [name] Other Clinician: Referring Toshiro Hanken: Treating Mazi Schuff/Extender: [name], [name] in Treatment:  28 Encounter Discharge Information Items Discharge Condition: Stable Ambulatory Status: [place] Discharge Destination: Home Transportation: Private Auto Schedule Follow-up Appointment: Yes Clinical Summary of Care: Provided on 03/25/2021 Form Type Recipient Paper Patient Patient Electronic Signature(s) Signed: 03/25/2021 3:30:35 PM By: Lorrin Jackson Entered By: Lorrin Jackson on 03/25/2021 12:47:55 -------------------------------------------------------------------------------- Patient/Caregiver Education Details Patient Name: Date of Service: [name], [place] 12/28/2022andnbsp12:30 PM Medical Record Number: [id number] Patient Account Number: [id number] Date of Birth/Gender: Treating RN: March 08, 1971 (50 y.o. [name] Primary Care Physician: [name] Other Clinician: Referring Physician: Treating Physician/Extender: Bobbye [place] in Treatment: 28 Education Assessment Education Provided To: Patient Education Topics Provided Venous: Methods: Explain/Verbal Responses: State content correctly Wound/Skin Impairment: Methods: Explain/Verbal Responses: State content correctly Electronic Signature(s) Signed: 03/25/2021 3:30:35 PM By: Lorrin Jackson Entered By: Lorrin Jackson on 03/25/2021 12:47:33 -------------------------------------------------------------------------------- Wound Assessment Details Patient Name: Date of Service: [name], [name] 03/25/2021 12:30 PM Medical Record Number: [id number] Patient Account Number: [id number] Date of Birth/Sex: Treating RN: [birth year] (50 y.o. [name] Primary Care Kenitra Leventhal: [name] Other Clinician: Referring Gloria Lambertson: Treating Noela Brothers/Extender: [name], [name] in Treatment: 28 Wound Status Wound Number: 3 Primary Diabetic Wound/Ulcer of the Lower Extremity Etiology: Wound Location: Right, Proximal, Lateral Foot Wound Open Wounding Event: Gradually  Appeared Status: Date Acquired: 10/29/2020 Comorbid Anemia, Coronary Artery Disease, Hypertension, Type II Weeks Of Treatment: 21 History: Diabetes, Osteomyelitis, Neuropathy Clustered Wound: No Wound Measurements Length: (cm) 0.4 Width: (cm) 0.2 Depth: (cm) 0.2 Area: (cm) 0.063 Volume: (cm) 0.013 % Reduction in Area: 50% % Reduction in Volume: 85.2% Epithelialization: Medium (34-66%) Tunneling: No Undermining: No Wound Description Classification: Grade 3 Wound Margin: Distinct, outline attached Exudate Amount: Medium Exudate Type: Serosanguineous Exudate Color: red, brown Foul Odor After Cleansing: No Slough/Fibrino Yes Wound Bed Granulation Amount: Large (67-100%) Exposed Structure Granulation Quality: Pink, Pale Fascia Exposed: No  Necrotic Amount: None Present (0%) Fat Layer (Subcutaneous Tissue) Exposed: Yes Tendon Exposed: No Muscle Exposed: No Joint Exposed: No Bone Exposed: No Treatment Notes Wound #3 (Foot) Wound Laterality: Right, Lateral, Proximal Cleanser Soap and Water Discharge Instruction: May shower and wash wound with dial antibacterial soap and water prior to dressing change. Wound Cleanser Discharge Instruction: Cleanse the wound with wound cleanser prior to applying a clean dressing using gauze sponges, not tissue or cotton balls. Peri-Wound Care Sween Lotion (Moisturizing lotion) Discharge Instruction: Apply moisturizing lotion to foot with dressing changes Topical Primary Dressing KerraCel Ag Gelling Fiber Dressing, 2x2 in (silver alginate) Discharge Instruction: Apply silver alginate to wound bed pack into tunnels Secondary Dressing Woven Gauze Sponge, Non-Sterile 4x4 in Discharge Instruction: Apply over primary dressing as directed. ABD Pad, 5x9 Discharge Instruction: Apply over primary dressing as directed. Secured With Compression Wrap Kerlix Roll 4.5x3.1 (in/yd) Discharge Instruction: Apply Kerlix and Coban compression as  directed. Coban Self-Adherent Wrap 4x5 (in/yd) Discharge Instruction: Apply over Kerlix as directed. Compression Stockings Add-Ons Electronic Signature(s) Signed: 03/25/2021 3:30:35 PM By: Lorrin Jackson Entered By: Lorrin Jackson on 03/25/2021 12:37:03 -------------------------------------------------------------------------------- Wound Assessment Details Patient Name: Date of Service: [name], [name] 03/25/2021 12:30 PM Medical Record Number: [id number] Patient Account Number: [id number] Date of Birth/Sex: Treating RN: 1970/04/04 (50 y.o. [name] Primary Care Khaleesi Gruel: [name] Other Clinician: Referring Demarie Hyneman: Treating Nevah Dalal/Extender: [name], [name] in Treatment: 28 Wound Status Wound Number: 7 Primary Diabetic Wound/Ulcer of the Lower Extremity Etiology: Wound Location: Right, Lateral, Plantar Foot Wound Open Wounding Event: Gradually Appeared Status: Date Acquired: 12/31/2020 Comorbid Anemia, Coronary Artery Disease, Hypertension, Type II Weeks Of Treatment: 12 History: Diabetes, Osteomyelitis, Neuropathy Clustered Wound: No Wound Measurements Length: (cm) 0.3 Width: (cm) 0.3 Depth: (cm) 0.7 Area: (cm) 0.071 Volume: (cm) 0.049 % Reduction in Area: 63.8% % Reduction in Volume: 75% Epithelialization: None Tunneling: No Undermining: No Wound Description Classification: Grade 3 Wound Margin: Well defined, not attached Exudate Amount: Medium Exudate Type: Serosanguineous Exudate Color: red, brown Foul Odor After Cleansing: No Slough/Fibrino No Wound Bed Granulation Amount: Large (67-100%) Exposed Structure Granulation Quality: Pink, Pale Fascia Exposed: No Necrotic Amount: None Present (0%) Fat Layer (Subcutaneous Tissue) Exposed: Yes Tendon Exposed: No Muscle Exposed: No Joint Exposed: No Bone Exposed: Yes Treatment Notes Wound #7 (Foot) Wound Laterality: Plantar, Right, Lateral Cleanser Soap and  Water Discharge Instruction: May shower and wash wound with dial antibacterial soap and water prior to dressing change. Wound Cleanser Discharge Instruction: Cleanse the wound with wound cleanser prior to applying a clean dressing using gauze sponges, not tissue or cotton balls. Peri-Wound Care Sween Lotion (Moisturizing lotion) Discharge Instruction: Apply moisturizing lotion to foot with dressing changes Topical Primary Dressing KerraCel Ag Gelling Fiber Dressing, 2x2 in (silver alginate) Discharge Instruction: Apply silver alginate to wound bed pack into tunnels Secondary Dressing Woven Gauze Sponge, Non-Sterile 4x4 in Discharge Instruction: Apply over primary dressing as directed. ABD Pad, 5x9 Discharge Instruction: Apply over primary dressing as directed. Secured With Compression Wrap Kerlix Roll 4.5x3.1 (in/yd) Discharge Instruction: Apply Kerlix and Coban compression as directed. Coban Self-Adherent Wrap 4x5 (in/yd) Discharge Instruction: Apply over Kerlix as directed. Compression Stockings Add-Ons Electronic Signature(s) Signed: 03/25/2021 3:30:35 PM By: Lorrin Jackson Entered By: Lorrin Jackson on 03/25/2021 12:36:21 -------------------------------------------------------------------------------- Vitals Details Patient Name: Date of Service: [name], [place] [name] 03/25/2021 12:30 PM Medical Record Number: [id number] Patient Account Number: [id number] Date of Birth/Sex: Treating RN: 1970-12-08 (50 y.o. [name]  Primary Care Lynsay Fesperman: [name] Other Clinician: Referring Trena Dunavan: Treating Margan Elias/Extender: [name], [name] in Treatment: 28 Vital Signs Time Taken: 12:35 Temperature (F): 98 Height (in): 65 Pulse (bpm): 84 Weight (lbs): 196 Respiratory Rate (breaths/min): 16 Body Mass Index (BMI): 32.6 Blood Pressure (mmHg): 140/86 Capillary Blood Glucose (mg/dl): 95 Reference Range: 80 - 120 mg / dl Electronic  Signature(s) Signed: 03/25/2021 12:57:46 PM By: Lorrin Jackson Entered By: Lorrin Jackson on 03/25/2021 12:57:45

## 2021-03-25 NOTE — Progress Notes (Signed)
Patient ID: [name], male    DOB: Oct 30, 1970  MRN: [id number]  CC: Diabetes Follow-Up   Subjective: [name] is a 50 y.o. male who presents for diabetes follow-up.   His concerns today include:   DIABETES TYPE 2 FOLLOW-UP: 01/07/2021: - Continue Insulin Glargine as prescribed. - Increase Dulaglutide from 1.5 mg once weekly to 3 mg once weekly.  04/01/2021: Doing well on current regimen. No issues and/or concerns.  Patient Active Problem List   Diagnosis Date Noted   Ulcer of right foot with necrosis of bone ([place]) 07/22/2020   Normocytic anemia 07/05/2020   Leukocytosis 07/05/2020   Wound infection 03/25/2020   Gas gangrene of foot ([place])    Type 2 diabetes mellitus with diabetic polyneuropathy, with long-term current use of insulin ([place]) 07/16/2019   Type 2 diabetes mellitus with stage 3a chronic kidney disease, with long-term current use of insulin ([place]) 07/16/2019   Diabetic foot ulcer ([place]) 06/29/2019   CRI (chronic renal insufficiency), stage 3 (moderate) ([place]) 01/24/2019   Obesity 01/24/2019   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[place]) 11/13/2018   Diabetic foot ([place]) 10/17/2018   Non-pressure chronic ulcer of other part of right foot limited to breakdown of skin ([place]) 09/11/2018   Type 2 diabetes mellitus with proliferative retinopathy of both eyes, without long-term current use of insulin ([place]) 07/14/2018   Diabetic infection of left foot ([place]) 07/14/2018   Wound cellulitis 05/22/2018   Osteomyelitis of foot, acute ([place]) 05/21/2018   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 ([place])    Diabetic ulcer of left midfoot associated with diabetes mellitus due to underlying condition, with fat layer exposed ([place]) 08/26/2017   B12 deficiency 06/15/2017   Vitamin D deficiency 06/03/2017   Iron deficiency anemia 06/01/2017   Diabetic foot infection ([place])    Osteomyelitis ([place]) 05/25/2017   Vitreous floaters of right eye 09/15/2016   Non compliance w medication  regimen 09/15/2016   Depression 09/01/2016   Foot ulcer due to secondary DM ([place]) 08/20/2016   Hidradenitis suppurativa 10/13/2015   Peripheral polyneuropathy 07/07/2015   Coronary atherosclerosis of native coronary artery 08/17/2013   Diabetic retinopathy ([place]) 02/02/2013   Diabetic neuropathy ([place]) 12/01/2012   Hypertension associated with diabetes ([place]) 08/03/2012   Tobacco use disorder 08/03/2012   Erectile dysfunction 08/03/2012   Diabetes mellitus with neurological manifestations, uncontrolled 05/26/2006     Current Outpatient Medications on File Prior to Visit  Medication Sig Dispense Refill   Accu-Chek FastClix Lancets MISC 1 Package by Does not apply route as directed. Use as instructed to test blood sugar 2 times daily E11.65 100 each 12   ACETAMINOPHEN EXTRA STRENGTH 500 MG tablet TAKE 2 TABLETS EVERY 6 HOURS AS NEEDED FOR PAIN 240 tablet 10   amLODipine (NORVASC) 10 MG tablet Take 1 tablet (10 mg total) by mouth daily. 90 tablet 3   ASPIRIN ADULT LOW STRENGTH 81 MG EC tablet TAKE 1 TABLET BY MOUTH EVERY MORNING 30 tablet 10   atorvastatin (LIPITOR) 20 MG tablet Take 1 tablet (20 mg total) by mouth daily. 90 tablet 1   b complex vitamins tablet Take 1 tablet by mouth daily. 100 tablet 3   B Complex-C (B-COMPLEX WITH VITAMIN C) tablet TAKE 1 TABLET EVERY MORNING 30 tablet 10   blood glucose meter kit and supplies KIT Dispense based on patient and insurance preference. Use up to four times daily as directed. (FOR ICD-9 250.00, 250.01). 1 each 0   Blood Pressure Monitor DEVI  Please provide patient with insurance approved blood pressure monitor. I10.0 1 each 0   carvedilol (COREG) 25 MG tablet Take 1 tablet (25 mg total) by mouth 2 (two) times daily. 180 tablet 1   cloNIDine (CATAPRES) 0.1 MG tablet TAKE 1 TABLET BY MOUTH TWICE  DAILY 180 tablet 3   Continuous Blood Gluc Receiver (FREESTYLE LIBRE 2 READER) DEVI Monitor blood glucose levels 4-5 times per day. ICD10 E11.42 Z79.4 1  each 6   Continuous Blood Gluc Sensor (FREESTYLE LIBRE 2 SENSOR) MISC Monitor blood glucose levels 4-5 times per day. ICD10 E11.42 Z79.4 1 each 6   Dulaglutide 3 MG/0.5ML SOPN Inject 3 mg into the skin once a week. 6 mL 0   escitalopram (LEXAPRO) 20 MG tablet Take 1 tablet (20 mg total) by mouth daily. 30 tablet 0   ferrous sulfate 325 (65 FE) MG EC tablet TAKE 1 TABLET BY MOUTH EVERY MORNING (Patient not taking: Reported on 03/03/2021) 30 tablet 10   ferrous sulfate 325 (65 FE) MG tablet Take 1 tablet (325 mg total) by mouth daily. 90 tablet 1   gabapentin (NEURONTIN) 300 MG capsule Take 1 capsule (300 mg total) by mouth 3 (three) times daily. Patient needs office visit before refills will be given 270 capsule 3   glucose blood test strip Use as instructed to test blood sugar 2 times daily E11.65 100 each 12   HYDROcodone-acetaminophen (NORCO/VICODIN) 5-325 MG tablet Take 1 tablet by mouth every 4 (four) hours as needed for moderate pain. (Patient not taking: Reported on 03/03/2021) 20 tablet 0   insulin lispro (HUMALOG KWIKPEN) 100 UNIT/ML KwikPen For blood sugars 0-150 give 0 units of insulin, 151-200 give 2 units of insulin, 201-250 give 4 units, 251-300 give 6 units, 301-350 give 8 units, 351-400 give 10 units,> 400 give 12 units and call M.D. Discussed hypoglycemia protocol. 9 mL 0   Insulin Pen Needle 32G X 4 MM MISC Use as instructed. Inject into the skin three time daily. 100 each 0   LANTUS SOLOSTAR 100 UNIT/ML Solostar Pen INJECT SUBCUTANEOUSLY 15 UNITS  TWICE DAILY 15 mL 6   lisinopril (ZESTRIL) 2.5 MG tablet Take 1 tablet (2.5 mg total) by mouth daily. 90 tablet 3   Misc. Devices MISC Please provide patient with insurance approved diabetic shoes. E11.65 1 each 0   polyethylene glycol (MIRALAX / GLYCOLAX) 17 g packet Take 17 g by mouth daily as needed.     polyethylene glycol powder (GLYCOLAX/MIRALAX) 17 GM/SCOOP powder DISSOLVE 1 CAPFUL (17G) IN 8OZ OF LIQUID AND DRINK BY MOUTH AS NEEDED  FOR CONSTIPATION 582.8571 g 10   senna (SENOKOT) 8.6 MG TABS tablet TAKE 1 TABLET BY MOUTH DAILY AS NEEDED FOR CONSTIPATION 30 tablet 10   tizanidine (ZANAFLEX) 2 MG capsule Take 1 capsule (2 mg total) by mouth 3 (three) times daily. (Patient not taking: Reported on 03/03/2021) 21 capsule 0   triamcinolone cream (KENALOG) 0.1 % Apply 1 application topically 2 (two) times daily. 80 g 0   [DISCONTINUED] cetirizine (ZYRTEC) 10 MG tablet Take 1 tablet (10 mg total) by mouth daily. Prn itching (Patient not taking: Reported on 03/25/2020) 30 tablet 11   No current facility-administered medications on file prior to visit.    Allergies  Allergen Reactions   Bee Venom Swelling    SWELLING REACTION UNSPECIFIED    Penicillins Hives and Other (See Comments)    Full body hives as a child with no shortness of breath or swelling  **Can tolerate  cephalosporins**   Clonidine Derivatives     Pt states "It is making me dizzy"    Social History   Socioeconomic History   Marital status: Legally Separated    Spouse name: Not on file   Number of children: Not on file   Years of education: Not on file   Highest education level: Not on file  Occupational History   Occupation: Food service    Employer: [employer]  Tobacco Use   Smoking status: Every Day    Packs/day: 0.50    Years: 25.00    Pack years: 12.50    Types: Cigarettes   Smokeless tobacco: Never  Vaping Use   Vaping Use: Never used  Substance and Sexual Activity   Alcohol use: Not Currently   Drug use: No   Sexual activity: Not Currently  Other Topics Concern   Not on file  Social History Narrative   No longer works in [employer]. On disability. Recently moved in early 2018 from [place]. Previous saw free clinic providers.       Living with and helping take are of his elderly father.   Social Determinants of Health   Financial Resource Strain: Not on file  Food Insecurity: Not on file  Transportation Needs: Not on  file  Physical Activity: Not on file  Stress: Not on file  Social Connections: Not on file  Intimate Partner Violence: Not on file    Family History  Problem Relation Age of Onset   Cancer Mother    Lung cancer Mother 89       smoker   Diabetes Father    Hypertension Father    Hyperlipidemia Other    Colon cancer Neg Hx    Colon polyps Neg Hx    Esophageal cancer Neg Hx    Rectal cancer Neg Hx    Stomach cancer Neg Hx     Past Surgical History:  Procedure Laterality Date   AMPUTATION Left 08/22/2016   Procedure: GREAT TOE AMPUTATION;  Surgeon: [name], MD;  Location: [facility];  Service: Orthopedics;  Laterality: Left;   AMPUTATION Right 05/28/2017   Procedure: AMPUTATION 1st & 3rd TOE;  Surgeon: [name], MD;  Location: [facility];  Service: Orthopedics;  Laterality: Right;   EYE SURGERY  yrs ago   Both Eye Lasik    I & D EXTREMITY Right 03/25/2020   Procedure: IRRIGATION AND DEBRIDEMENT OF RIGHT FOOT. AMPUTATION OF FIFTH TOE AND PARTIAL OF FOURTH.;  Surgeon: [name], DPM;  Location: [facility];  Service: Podiatry;  Laterality: Right;   IRRIGATION AND DEBRIDEMENT FOOT Left 06/29/2019   Procedure: IRRIGATION AND DEBRIDEMENT FOOT application wound vac;  Surgeon: [name], DPM;  Location: [facility];  Service: Podiatry;  Laterality: Left;   IRRIGATION AND DEBRIDEMENT FOOT Right 05/16/2020   Procedure: IRRIGATION AND DEBRIDEMENT FOOT;  Surgeon: [name], DPM;  Location: [facility];  Service: Podiatry;  Laterality: Right;  Leave patient in bed   IRRIGATION AND DEBRIDEMENT FOOT Right 06/04/2020   Procedure: IRRIGATION AND DEBRIDEMENT FOOT, APPLICATION OF SKIN GRAFT SUBSTITUTE;  Surgeon: [name], DPM;  Location: [facility];  Service: Podiatry;  Laterality: Right;   IRRIGATION AND DEBRIDEMENT FOOT Right 07/29/2020   Procedure: IRRIGATION AND DEBRIDEMENT FOOT; METATARSAL RESECTION AS INDICATED RIGHT FOOT;  Surgeon: [name],  DPM;  Location: [facility];  Service: Podiatry;  Laterality: Right;   LEFT HEART CATHETERIZATION  WITH CORONARY ANGIOGRAM N/A 07/31/2013   Procedure: LEFT HEART CATHETERIZATION WITH CORONARY ANGIOGRAM;  Surgeon: Jettie Booze, MD;  Location: [facility];  Service: Cardiovascular;  Laterality: N/A;   spinal tap  yrs ago   toe amputated     TRANSMETATARSAL AMPUTATION Right 03/27/2020   Procedure: TRANSMETATARSAL AMPUTATION RIGHT FOOT, MEDIAL PLANTAR ARTERY FLAP, APPLICATION OF WOUND VAC ;  Surgeon: [name], DPM;  Location[facility];  Service: Podiatry;  Laterality: Right;    ROS: Review of Systems Negative except as stated above  PHYSICAL EXAM: BP 128/85 (BP Location: Left Arm, Patient Position: Sitting, Cuff Size: Normal)    Pulse 98    Temp 98 F (36.7 C)    Resp 18    Ht 5' 4.96" (1.65 m)    Wt 190 lb 6.4 oz (86.4 kg)    SpO2 99%    BMI 31.72 kg/m  Physical Exam HENT:     Head: Normocephalic and atraumatic.  Eyes:     Extraocular Movements: Extraocular movements intact.     Conjunctiva/sclera: Conjunctivae normal.     Pupils: Pupils are equal, round, and reactive to light.  Cardiovascular:     Rate and Rhythm: Normal rate and regular rhythm.     Pulses: Normal pulses.     Heart sounds: Normal heart sounds.  Pulmonary:     Effort: Pulmonary effort is normal.     Breath sounds: Normal breath sounds.  Musculoskeletal:     Cervical back: Normal range of motion and neck supple.  Neurological:     General: No focal deficit present.     Mental Status: He is alert and oriented to person, place, and time.  Psychiatric:        Mood and Affect: Mood normal.        Behavior: Behavior normal.     ASSESSMENT AND PLAN: 1. Type 2 diabetes mellitus with diabetic polyneuropathy, with long-term current use of insulin ([place]): - Point-of-care hemoglobin A1c unavailable today in office. Sending hemoglobin A1c out to lab for resulting. Will update plan of care and medication regimen once  lab results.  - Discussed the importance of healthy eating habits, low-carbohydrate diet, low-sugar diet, regular aerobic exercise (at least 150 minutes a week as tolerated) and medication compliance to achieve or maintain control of diabetes. - Follow-up with primary provider as scheduled. - Hemoglobin A1c    Patient was given the opportunity to ask questions.  Patient verbalized understanding of the plan and was able to repeat key elements of the plan. Patient was given clear instructions to go to Emergency Department or return to medical center if symptoms don't improve, worsen, or new problems develop.The patient verbalized understanding.   Orders Placed This Encounter  Procedures   Hemoglobin A1c    Follow-up with primary provider as scheduled.   Camillia Herter, NP

## 2021-03-25 NOTE — Progress Notes (Signed)
VANDEN, FAWAZ ([id number]) Visit Report for 03/25/2021 Arrival Information Details Patient Name: Date of Service: [place] RD, [name] 03/25/2021 10:00 A M Medical Record Number: [id number] Patient Account Number: [id number] Date of Birth/Sex: Treating RN: Mar 30, 1970 (50 y.o. [name], Meta.Reding Primary Care Rahi Chandonnet: [name] Other Clinician: Referring Michalle Rademaker: Treating Latavius Capizzi/Extender: [name], [name] in Treatment: 28 Visit Information History Since Last Visit Added or deleted any medications: No Patient Arrived: [name] Any new allergies or adverse reactions: No Arrival Time: 09:43 Had a fall or experienced change in No Accompanied By: self activities of daily living that may affect Transfer Assistance: None risk of falls: Patient Identification Verified: Yes Signs or symptoms of abuse/neglect since last visito No Secondary Verification Process Completed: Yes Hospitalized since last visit: No Patient Requires Transmission-Based Precautions: No Implantable device outside of the clinic excluding No Patient Has Alerts: No cellular tissue based products placed in the center since last visit: Has Dressing in Place as Prescribed: Yes Pain Present Now: No Electronic Signature(s) Signed: 03/25/2021 3:01:11 PM By: [name] RN, BSN Entered By: [name] on 03/25/2021 10:52:11 -------------------------------------------------------------------------------- Encounter Discharge Information Details Patient Name: Date of Service: [name], [place] 03/25/2021 10:00 A M Medical Record Number: [id number] Patient Account Number: [id number] Date of Birth/Sex: Treating RN: 1970-10-05 (50 y.o. [name] Primary Care Grayton Lobo: [name] Other Clinician: Referring Tamecia Mcdougald: Treating Philippe Gang/Extender: [name], [name] in Treatment: 28 Encounter Discharge Information Items Discharge Condition: Stable Ambulatory Status:  Cane Discharge Destination: Home Transportation: Private Auto Accompanied By: self Schedule Follow-up Appointment: Yes Clinical Summary of Care: Electronic Signature(s) Signed: 03/25/2021 3:01:11 PM By: [name] RN, BSN Entered By: [name] on 03/25/2021 12:30:32 -------------------------------------------------------------------------------- Vitals Details Patient Name: Date of Service: [name], [name] 03/25/2021 10:00 A M Medical Record Number: [id number] Patient Account Number: [id number] Date of Birth/Sex: Treating RN: 07/27/1970 (50 y.o. [name] Primary Care Arsenio Schnorr: [name] Other Clinician: Referring Nadya Hopwood: Treating Gable Odonohue/Extender: [name], [name] in Treatment: 28 Vital Signs Time Taken: 09:43 Temperature (F): 98 Height (in): 65 Pulse (bpm): 87 Weight (lbs): 196 Respiratory Rate (breaths/min): 20 Body Mass Index (BMI): 32.6 Blood Pressure (mmHg): 148/80 Capillary Blood Glucose (mg/dl): 235 Reference Range: 80 - 120 mg / dl Electronic Signature(s) Signed: 03/25/2021 3:01:11 PM By: [name] RN, BSN Entered By: [name] on 03/25/2021 10:52:33

## 2021-03-25 NOTE — Progress Notes (Signed)
[name], [name] ([id number]) Visit Report for 03/25/2021 SuperBill Details Patient Name: Date of Service: [place] [address], [name] 03/25/2021 Medical Record Number: [id number] Patient Account Number: [id number] Date of Birth/Sex: Treating RN: 09/12/1970 (50 y.o. [name] Primary Care Provider: [name] Other Clinician: Referring Provider: Treating Provider/Extender: [name], [name] in Treatment: 28 Diagnosis Coding ICD-10 Codes Code Description [phone] Other chronic osteomyelitis, right ankle and foot E11.621 Type 2 diabetes mellitus with foot ulcer L97.514 Non-pressure chronic ulcer of other part of right foot with necrosis of bone T81.31XS Disruption of external operation (surgical) wound, not elsewhere classified, sequela F17.218 Nicotine dependence, cigarettes, with other nicotine-induced disorders L97.522 Non-pressure chronic ulcer of other part of left foot with fat layer exposed L84 Corns and callosities Facility Procedures CPT4 Code Description Modifier Quantity [id number] - WOUND CARE VISIT-LEV 3 EST PT 1 Electronic Signature(s) Signed: 03/25/2021 3:30:35 PM By: Lorrin Jackson Signed: 03/25/2021 3:46:55 PM By: [name] MD Entered By: Lorrin Jackson on 03/25/2021 12:48:44

## 2021-03-26 ENCOUNTER — Encounter (HOSPITAL_BASED_OUTPATIENT_CLINIC_OR_DEPARTMENT_OTHER): Payer: 59 | Admitting: Internal Medicine

## 2021-03-26 ENCOUNTER — Other Ambulatory Visit: Payer: Self-pay

## 2021-03-26 DIAGNOSIS — E1169 Type 2 diabetes mellitus with other specified complication: Secondary | ICD-10-CM | POA: Diagnosis not present

## 2021-03-26 LAB — GLUCOSE, CAPILLARY
Glucose-Capillary: 198 mg/dL — ABNORMAL HIGH (ref 70–99)
Glucose-Capillary: 123 mg/dL — ABNORMAL HIGH (ref 70–99)

## 2021-03-26 NOTE — Progress Notes (Signed)
COTEY, RAKES ([id number]) Visit Report for 03/26/2021 SuperBill Details Patient Name: Date of Service: [place] RD, [name] 03/26/2021 Medical Record Number: [id number] Patient Account Number: [id number] Date of Birth/Sex: Treating RN: 13-Jan-1971 (50 y.o. M) Primary Care Provider: [name] Other Clinician: Maye Hides Referring Provider: Treating Provider/Extender: [name], [name] in Treatment: 28 Diagnosis Coding ICD-10 Codes Code Description [phone] Other chronic osteomyelitis, right ankle and foot E11.621 Type 2 diabetes mellitus with foot ulcer L97.514 Non-pressure chronic ulcer of other part of right foot with necrosis of bone T81.31XS Disruption of external operation (surgical) wound, not elsewhere classified, sequela F17.218 Nicotine dependence, cigarettes, with other nicotine-induced disorders L97.522 Non-pressure chronic ulcer of other part of left foot with fat layer exposed L84 Corns and callosities Facility Procedures CPT4 Code Description Modifier Quantity [id number] G0277-(Facility Use Only) HBOT full body chamber, 79min , 4 ICD-10 Diagnosis Description M86.671 Other chronic osteomyelitis, right ankle and foot E11.621 Type 2 diabetes mellitus with foot ulcer L97.514 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

## 2021-03-26 NOTE — Progress Notes (Addendum)
[name], [name] ([id number]) Visit Report for 03/26/2021 HBO Details Patient Name: Date of Service: [name], [name] 03/26/2021 10:00 A M Medical Record Number: [id number] Patient Account Number: [id number] Date of Birth/Sex: Treating RN: [birth year] (50 y.o. M) Primary Care Porshea Janowski: [name] Other Clinician: Referring Creston Klas: Treating Georgia Baria/Extender: [name], [name] in Treatment: 28 HBO Treatment Course Details Treatment Course Number: 1 Ordering Kaidence Callaway: [name] Treatments Ordered: otal 60 HBO Treatment Start Date: 12/08/2020 HBO Indication: Diabetic Ulcer(s) of the Lower Extremity HBO Treatment Details Treatment Number: 44 Patient Type: Outpatient Chamber Type: Monoplace Chamber Serial #: [id number] Treatment Protocol: 2.5 ATA with 90 minutes oxygen, with two 5 minute air breaks Treatment Details Compression Rate Down: 2.0 psi / minute De-Compression Rate Up: A breaks and breathing ir Compress Tx Pressure periods Decompress Decompress Begins Reached (leave unused spaces Begins Ends blank) Chamber Pressure (ATA 1 2.5 2.5 2.5 2.5 2.5 - - 2.5 1 ) Clock Time (24 hr) 10:05 10:18 10:48 10:53 11:23 11:28 - - 11:58 12:12 Treatment Length: 127 (minutes) Treatment Segments: 4 Vital Signs Capillary Blood Glucose Reference Range: 80 - 120 mg / dl HBO Diabetic Blood Glucose Intervention Range: <131 mg/dl or >249 mg/dl Time Vitals Blood Respiratory Capillary Blood Glucose Pulse Action Type: Pulse: Temperature: Taken: Pressure: Rate: Glucose (mg/dl): Meter #: Oximetry (%) Taken: Pre 10:03 152/93 90 16 98.2 198 Post 12:15 123/96 106 16 97.9 123 Treatment Response Treatment Completion Status: Treatment Completed without Adverse Event Treatment Notes Patient treatment was uneventful. Additional Procedure Documentation Tissue Sevierity: Muscle involvement without necrosis Yasmine Kilbourne Notes No concerns with treatment given Physician HBO  Attestation: I certify that I supervised this HBO treatment in accordance with Medicare guidelines. A trained emergency response team is readily available per Yes hospital policies and procedures. Continue HBOT as ordered. Yes Electronic Signature(s) Signed: 03/26/2021 5:16:25 PM By: [name] MD Previous Signature: 03/26/2021 2:02:01 PM Version By: Maye Hides Entered By: [name] on 03/26/2021 17:14:00 -------------------------------------------------------------------------------- HBO Safety Checklist Details Patient Name: Date of Service: Joseph Hahn RD, [name] 03/26/2021 10:00 A M Medical Record Number: [id number] Patient Account Number: [id number] Date of Birth/Sex: Treating RN: [birth year] (50 y.o. M) Primary Care Alex Leahy: [name] Other Clinician: Maye Hides Referring Matvey Llanas: Treating Ariyannah Pauling/Extender: [name], [name] in Treatment: 28 HBO Safety Checklist Items Safety Checklist Consent Form Signed Patient voided / foley secured and emptied When did you last eato 0700 Last dose of injectable or oral agent Ostomy pouch emptied and vented if applicable NA All implantable devices assessed, documented and approved NA Intravenous access site secured and place NA Valuables secured Linens and cotton and cotton/polyester blend (less than 51% polyester) Personal oil-based products / skin lotions / body lotions removed Wigs or hairpieces removed NA Smoking or tobacco materials removed NA Books / newspapers / magazines / loose paper removed Cologne, aftershave, perfume and deodorant removed Jewelry removed (may wrap wedding band) Make-up removed NA Hair care products removed Battery operated devices (external) removed NA Heating patches and chemical warmers removed NA Titanium eyewear removed NA Nail polish cured greater than 10 hours NA Casting material cured greater than 10 hours NA Hearing aids removed NA Loose  dentures or partials removed NA Prosthetics have been removed NA Patient demonstrates correct use of air break device (if applicable) Patient concerns have been addressed Patient grounding bracelet on and cord attached to chamber Specifics for Inpatients (complete in addition to above) Medication sheet sent with patient Intravenous medications needed  or due during therapy sent with patient Drainage tubes (e.g. nasogastric tube or chest tube secured and vented) Endotracheal or Tracheotomy tube secured Cuff deflated of air and inflated with saline Airway suctioned Electronic Signature(s) Signed: 03/26/2021 1:55:32 PM By: Maye Hides Entered By: Maye Hides on 03/26/2021 13:55:32

## 2021-03-27 ENCOUNTER — Ambulatory Visit: Payer: 59 | Admitting: Podiatry

## 2021-03-27 ENCOUNTER — Encounter (HOSPITAL_BASED_OUTPATIENT_CLINIC_OR_DEPARTMENT_OTHER): Payer: 59 | Admitting: Internal Medicine

## 2021-03-27 DIAGNOSIS — M86671 Other chronic osteomyelitis, right ankle and foot: Secondary | ICD-10-CM

## 2021-03-27 DIAGNOSIS — E11621 Type 2 diabetes mellitus with foot ulcer: Secondary | ICD-10-CM

## 2021-03-27 DIAGNOSIS — E1169 Type 2 diabetes mellitus with other specified complication: Secondary | ICD-10-CM | POA: Diagnosis not present

## 2021-03-27 NOTE — Progress Notes (Signed)
YING, ROCKS ([id number]) Visit Report for 03/27/2021 Arrival Information Details Patient Name: Date of Service: [address] [place] [address], [name] 03/27/2021 10:00 A M Medical Record Number: [id number] Patient Account Number: [id number] Date of Birth/Sex: Treating RN: 12-Dec-1970 (50 y.o. [name] Primary Care Wylma Tatem: [name] Other Clinician: Referring Aeden Matranga: Treating Courage Biglow/Extender: [name], [name] in Treatment: 29 Visit Information History Since Last Visit Added or deleted any medications: No Patient Arrived: Cane Any new allergies or adverse reactions: No Arrival Time: 10:15 Had a fall or experienced change in No Accompanied By: alone activities of daily living that may affect Transfer Assistance: None risk of falls: Patient Requires Transmission-Based Precautions: No Signs or symptoms of abuse/neglect since last visito No Patient Has Alerts: No Hospitalized since last visit: No Implantable device outside of the clinic excluding No cellular tissue based products placed in the center since last visit: Has Dressing in Place as Prescribed: Yes Pain Present Now: No Electronic Signature(s) Signed: 03/27/2021 2:54:09 PM By: Levan Hurst RN, BSN Entered By: Levan Hurst on 03/27/2021 10:52:30 -------------------------------------------------------------------------------- Encounter Discharge Information Details Patient Name: Date of Service: [name], [name] 03/27/2021 10:00 A M Medical Record Number: [id number] Patient Account Number: [id number] Date of Birth/Sex: Treating RN: 1970/06/20 (50 y.o. [name] Primary Care Collin Hendley: [name] Other Clinician: Referring Armarion Greek: Treating [name] Clapsaddle/Extender: [name], [name] in Treatment: 29 Encounter Discharge Information Items Discharge Condition: Stable Ambulatory Status: Cane Discharge Destination: Home Transportation: Private Auto Accompanied By:  alone Schedule Follow-up Appointment: Yes Clinical Summary of Care: Patient Declined Electronic Signature(s) Signed: 03/27/2021 2:54:09 PM By: Levan Hurst RN, BSN Entered By: Levan Hurst on 03/27/2021 13:21:05 -------------------------------------------------------------------------------- Vitals Details Patient Name: Date of Service: [name], [place]. 03/27/2021 10:00 A M Medical Record Number: [id number] Patient Account Number: [id number] Date of Birth/Sex: Treating RN: [birth year] (50 y.o. [name] Primary Care Jelissa Espiritu: [name] Other Clinician: Referring Naileah Karg: Treating Keigo Whalley/Extender: [name], [name] in Treatment: 29 Vital Signs Time Taken: 10:15 Temperature (F): 9739 Height (in): 65 Pulse (bpm): 101 Weight (lbs): 196 Respiratory Rate (breaths/min): 18 Body Mass Index (BMI): 32.6 Blood Pressure (mmHg): 171/102 Capillary Blood Glucose (mg/dl): 182 Reference Range: 80 - 120 mg / dl Electronic Signature(s) Signed: 03/27/2021 2:54:09 PM By: Levan Hurst RN, BSN Entered By: Levan Hurst on 03/27/2021 10:52:59

## 2021-03-27 NOTE — Progress Notes (Signed)
RHYSE, LOUX ([id number]) Visit Report for 03/27/2021 SuperBill Details Patient Name: Date of Service: [place] [address], [place] 03/27/2021 Medical Record Number: [id number] Patient Account Number: [id number] Date of Birth/Sex: Treating RN: 08/26/1970 (50 y.o. [name] Primary Care Provider: [name] Other Clinician: Referring Provider: Treating Provider/Extender: [name], [name] in Treatment: 29 Diagnosis Coding ICD-10 Codes Code Description [phone] Other chronic osteomyelitis, right ankle and foot E11.621 Type 2 diabetes mellitus with foot ulcer L97.514 Non-pressure chronic ulcer of other part of right foot with necrosis of bone T81.31XS Disruption of external operation (surgical) wound, not elsewhere classified, sequela F17.218 Nicotine dependence, cigarettes, with other nicotine-induced disorders L97.522 Non-pressure chronic ulcer of other part of left foot with fat layer exposed L84 Corns and callosities Facility Procedures CPT4 Code Description Modifier Quantity [id number] G0277-(Facility Use Only) HBOT full body chamber, 64min , 4 Physician Procedures Quantity CPT4 Code Description Modifier [id number] 22482 - WC PHYS HYPERBARIC OXYGEN THERAPY 1 ICD-10 Diagnosis Description M86.671 Other chronic osteomyelitis, right ankle and foot E11.621 Type 2 diabetes mellitus with foot ulcer Electronic Signature(s) Signed: 03/27/2021 1:28:15 PM By: Kalman Shan DO Signed: 03/27/2021 2:54:09 PM By: Levan Hurst RN, BSN Entered By: Levan Hurst on 03/27/2021 13:20:30

## 2021-03-27 NOTE — Progress Notes (Signed)
[name], [name] ([id number]) Visit Report for 03/27/2021 SuperBill Details Patient Name: Date of Service: [address], [place] 03/27/2021 Medical Record Number: [id number] Patient Account Number: [id number] Date of Birth/Sex: Treating RN: 29-Apr-1970 (50 y.o. [name] Primary Care Provider: [name] Other Clinician: Referring Provider: Treating Provider/Extender: [name], [name] in Treatment: 29 Diagnosis Coding ICD-10 Codes Code Description [phone] Other chronic osteomyelitis, right ankle and foot E11.621 Type 2 diabetes mellitus with foot ulcer L97.514 Non-pressure chronic ulcer of other part of right foot with necrosis of bone T81.31XS Disruption of external operation (surgical) wound, not elsewhere classified, sequela F17.218 Nicotine dependence, cigarettes, with other nicotine-induced disorders L97.522 Non-pressure chronic ulcer of other part of left foot with fat layer exposed L84 Corns and callosities Facility Procedures CPT4 Code Description Modifier Quantity [id number] 99213 - WOUND CARE VISIT-LEV 3 EST PT 1 Electronic Signature(s) Signed: 03/27/2021 9:12:01 AM By: Kalman Shan DO Signed: 03/27/2021 1:50:30 PM By: Baruch Gouty RN, BSN Entered By: Baruch Gouty on 03/27/2021 08:59:35

## 2021-03-27 NOTE — Progress Notes (Addendum)
[name], [name] ([id number]) Visit Report for 03/26/2021 Arrival Information Details Patient Name: Date of Service: [name], [name] 03/26/2021 10:00 A M Medical Record Number: [id number] Patient Account Number: [id number] Date of Birth/Sex: Treating RN: September 26, 1970 (50 y.o. M) Primary Care Keyondra Lagrand: [name] Other Clinician: Maye Hides Referring Maeby Vankleeck: Treating Jessina Marse/Extender: [name], [name] in Treatment: 28 Visit Information History Since Last Visit All ordered tests and consults were completed: Yes Patient Arrived: [name] Added or deleted any medications: No Arrival Time: 10:00 Any new allergies or adverse reactions: No Transfer Assistance: None Had a fall or experienced change in No Patient Identification Verified: Yes activities of daily living that may affect Secondary Verification Process Completed: Yes risk of falls: Patient Requires Transmission-Based Precautions: No Signs or symptoms of abuse/neglect since last visito No Patient Has Alerts: No Hospitalized since last visit: No Implantable device outside of the clinic excluding No cellular tissue based products placed in the center since last visit: Pain Present Now: No Electronic Signature(s) Signed: 03/26/2021 1:50:10 PM By: Maye Hides Entered By: Maye Hides on 03/26/2021 13:50:10 -------------------------------------------------------------------------------- Vitals Details Patient Name: Date of Service: [name], [name] 03/26/2021 10:00 A M Medical Record Number: [id number] Patient Account Number: [id number] Date of Birth/Sex: Treating RN: 17-Dec-1970 (50 y.o. M) Primary Care Caydn Justen: [name] Other Clinician: Maye Hides Referring Mirha Brucato: Treating Chaseton Yepiz/Extender: [name], [name] in Treatment: 28 Vital Signs Time Taken: 10:03 Temperature (F): 98.2 Height (in): 65 Pulse (bpm): 90 Weight (lbs): 196 Respiratory Rate  (breaths/min): 16 Body Mass Index (BMI): 32.6 Blood Pressure (mmHg): 152/93 Capillary Blood Glucose (mg/dl): 198 Reference Range: 80 - 120 mg / dl Electronic Signature(s) Signed: 03/26/2021 1:51:03 PM By: Maye Hides Entered By: Maye Hides on 03/26/2021 13:51:03

## 2021-03-27 NOTE — Progress Notes (Signed)
MOSHE, WENGER ([id number]) Visit Report for 03/27/2021 HBO Details Patient Name: Date of Service: [place] RD, [name] 03/27/2021 10:00 A M Medical Record Number: [id number] Patient Account Number: [id number] Date of Birth/Sex: Treating RN: Feb 13, 1971 (50 y.o. [name] Primary Care Kenetra Hildenbrand: [name] Other Clinician: Referring Taisei Bonnette: Treating Kaheem Halleck/Extender: [name], [name] in Treatment: 29 HBO Treatment Course Details Treatment Course Number: 1 Ordering Tarus Briski: [name] Treatments Ordered: otal 60 HBO Treatment Start Date: 12/08/2020 HBO Indication: Diabetic Ulcer(s) of the Lower Extremity HBO Treatment Details Treatment Number: 20 Patient Type: Outpatient Chamber Type: Monoplace Chamber Serial #: [id number] Treatment Protocol: 2.5 ATA with 90 minutes oxygen, with two 5 minute air breaks Treatment Details Compression Rate Down: 2.0 psi / minute De-Compression Rate Up: 2.0 psi / minute A breaks and breathing ir Compress Tx Pressure periods Decompress Decompress Begins Reached (leave unused spaces Begins Ends blank) Chamber Pressure (ATA 1 2.5 2.5 2.5 2.5 2.5 - - 2.5 1 ) Clock Time (24 hr) 10:25 10:37 11:07 11:12 11:42 11:47 - - 12:17 12:29 Treatment Length: 124 (minutes) Treatment Segments: 4 Vital Signs Capillary Blood Glucose Reference Range: 80 - 120 mg / dl HBO Diabetic Blood Glucose Intervention Range: <131 mg/dl or >249 mg/dl Time Vitals Blood Respiratory Capillary Blood Glucose Pulse Action Type: Pulse: Temperature: Taken: Pressure: Rate: Glucose (mg/dl): Meter #: Oximetry (%) Taken: Pre 10:15 171/102 101 18 9739 182 Post 12:29 143/90 87 16 97.9 111 Treatment Response Treatment Completion Status: Treatment Completed without Adverse Event Additional Procedure Documentation Tissue Sevierity: Fat layer exposed Physician HBO Attestation: I certify that I supervised this HBO treatment in accordance with  Medicare guidelines. A trained emergency response team is readily available per Yes hospital policies and procedures. Continue HBOT as ordered. Yes Electronic Signature(s) Signed: 03/27/2021 1:28:15 PM By: Kalman Shan DO Entered By: Kalman Shan on 03/27/2021 13:27:31 -------------------------------------------------------------------------------- HBO Safety Checklist Details Patient Name: Date of Service: [name], [name] 03/27/2021 10:00 A M Medical Record Number: [id number] Patient Account Number: [id number] Date of Birth/Sex: Treating RN: 1971-01-29 (50 y.o. [name] Primary Care Munachimso Palin: [name] Other Clinician: Referring Lorren Splawn: Treating Yeraldine Forney/Extender: [name], [name] in Treatment: 29 HBO Safety Checklist Items Safety Checklist Consent Form Signed Patient voided / foley secured and emptied When did you last eato 0730 Last dose of injectable or oral agent this AM Ostomy pouch emptied and vented if applicable NA All implantable devices assessed, documented and approved NA Intravenous access site secured and place NA Valuables secured Linens and cotton and cotton/polyester blend (less than 51% polyester) Personal oil-based products / skin lotions / body lotions removed Wigs or hairpieces removed Smoking or tobacco materials removed Books / newspapers / magazines / loose paper removed Cologne, aftershave, perfume and deodorant removed Jewelry removed (may wrap wedding band) Make-up removed Hair care products removed Battery operated devices (external) removed Heating patches and chemical warmers removed Titanium eyewear removed Nail polish cured greater than 10 hours NA Casting material cured greater than 10 hours NA Hearing aids removed Loose dentures or partials removed Prosthetics have been removed NA Patient demonstrates correct use of air break device (if applicable) Patient concerns have been  addressed Patient grounding bracelet on and cord attached to chamber Specifics for Inpatients (complete in addition to above) Medication sheet sent with patient NA Intravenous medications needed or due during therapy sent with patient NA Drainage tubes (e.g. nasogastric tube or chest tube secured and vented) NA Endotracheal or  Tracheotomy tube secured NA Cuff deflated of air and inflated with saline NA Airway suctioned NA Electronic Signature(s) Signed: 03/27/2021 2:54:09 PM By: Levan Hurst RN, BSN Entered By: Levan Hurst on 03/27/2021 10:54:07

## 2021-03-27 NOTE — Progress Notes (Signed)
[name], [name] ([id number]) Visit Report for 03/27/2021 Arrival Information Details Patient Name: Date of Service: [address], [name] 03/27/2021 8:30 A M Medical Record Number: [id number] Patient Account Number: [id number] Date of Birth/Sex: Treating RN: 03-Nov-1970 (50 y.o. [name], [name] Primary Care Pj Zehner: Minette Brine, [place] Other Clinician: Referring Raini Tiley: Treating Allana Shrestha/Extender: [name], [name] in Treatment: 29 Visit Information History Since Last Visit Added or deleted any medications: No Patient Arrived: [name] Any new allergies or adverse reactions: No Arrival Time: 08:31 Had a fall or experienced change in No Accompanied By: self activities of daily living that may affect Transfer Assistance: None risk of falls: Patient Identification Verified: Yes Signs or symptoms of abuse/neglect since last visito No Secondary Verification Process Completed: Yes Hospitalized since last visit: No Patient Requires Transmission-Based Precautions: No Implantable device outside of the clinic excluding No Patient Has Alerts: No cellular tissue based products placed in the center since last visit: Has Dressing in Place as Prescribed: Yes Has Compression in Place as Prescribed: Yes Pain Present Now: Yes Electronic Signature(s) Signed: 03/27/2021 1:50:30 PM By: Baruch Gouty RN, BSN Entered By: Baruch Gouty on 03/27/2021 08:34:00 -------------------------------------------------------------------------------- Clinic Level of Care Assessment Details Patient Name: Date of Service: [facility] RD, [name] 03/27/2021 8:30 A M Medical Record Number: [id number] Patient Account Number: [id number] Date of Birth/Sex: Treating RN: Nov 29, 1970 (50 y.o. [name] Primary Care Maeven Mcdougall: [name] Other Clinician: Referring Lopez Dentinger: Treating Berkley Cronkright/Extender: [name], [name] in Treatment: 29 Clinic Level of Care Assessment  Items TOOL 4 Quantity Score []  - 0 Use when only an EandM is performed on FOLLOW-UP visit ASSESSMENTS - Nursing Assessment / Reassessment X- 1 10 Reassessment of Co-morbidities (includes updates in patient status) X- 1 5 Reassessment of Adherence to Treatment Plan ASSESSMENTS - Wound and Skin A ssessment / Reassessment []  - 0 Simple Wound Assessment / Reassessment - one wound X- 2 5 Complex Wound Assessment / Reassessment - multiple wounds []  - 0 Dermatologic / Skin Assessment (not related to wound area) ASSESSMENTS - Focused Assessment []  - 0 Circumferential Edema Measurements - multi extremities []  - 0 Nutritional Assessment / Counseling / Intervention []  - 0 Lower Extremity Assessment (monofilament, tuning fork, pulses) []  - 0 Peripheral Arterial Disease Assessment (using hand held doppler) ASSESSMENTS - Ostomy and/or Continence Assessment and Care []  - 0 Incontinence Assessment and Management []  - 0 Ostomy Care Assessment and Management (repouching, etc.) PROCESS - Coordination of Care X - Simple Patient / Family Education for ongoing care 1 15 []  - 0 Complex (extensive) Patient / Family Education for ongoing care X- 1 10 Staff obtains Programmer, systems, Records, T Results / Process Orders est []  - 0 Staff telephones HHA, Nursing Homes / Clarify orders / etc []  - 0 Routine Transfer to another Facility (non-emergent condition) []  - 0 Routine Hospital Admission (non-emergent condition) []  - 0 New Admissions / Biomedical engineer / Ordering NPWT Apligraf, etc. , []  - 0 Emergency Hospital Admission (emergent condition) X- 1 10 Simple Discharge Coordination []  - 0 Complex (extensive) Discharge Coordination PROCESS - Special Needs []  - 0 Pediatric / Minor Patient Management []  - 0 Isolation Patient Management []  - 0 Hearing / Language / Visual special needs []  - 0 Assessment of Community assistance (transportation, D/C planning, etc.) []  - 0 Additional  assistance / Altered mentation []  - 0 Support Surface(s) Assessment (bed, cushion, seat, etc.) INTERVENTIONS - Wound Cleansing / Measurement []  - 0 Simple Wound Cleansing - one wound  X- 2 5 Complex Wound Cleansing - multiple wounds []  - 0 Wound Imaging (photographs - any number of wounds) []  - 0 Wound Tracing (instead of photographs) []  - 0 Simple Wound Measurement - one wound []  - 0 Complex Wound Measurement - multiple wounds INTERVENTIONS - Wound Dressings X - Small Wound Dressing one or multiple wounds 2 10 []  - 0 Medium Wound Dressing one or multiple wounds []  - 0 Large Wound Dressing one or multiple wounds []  - 0 Application of Medications - topical []  - 0 Application of Medications - injection INTERVENTIONS - Miscellaneous []  - 0 External ear exam []  - 0 Specimen Collection (cultures, biopsies, blood, body fluids, etc.) []  - 0 Specimen(s) / Culture(s) sent or taken to Lab for analysis []  - 0 Patient Transfer (multiple staff / Civil Service fast streamer / Similar devices) []  - 0 Simple Staple / Suture removal (25 or less) []  - 0 Complex Staple / Suture removal (26 or more) []  - 0 Hypo / Hyperglycemic Management (close monitor of Blood Glucose) []  - 0 Ankle / Brachial Index (ABI) - do not check if billed separately X- 1 5 Vital Signs Has the patient been seen at the hospital within the last three years: Yes Total Score: 95 Level Of Care: New/Established - Level 3 Electronic Signature(s) Signed: 03/27/2021 1:50:30 PM By: Baruch Gouty RN, BSN Entered By: Baruch Gouty on 03/27/2021 08:56:41 -------------------------------------------------------------------------------- Encounter Discharge Information Details Patient Name: Date of Service: [name] RD, [name] 03/27/2021 8:30 A M Medical Record Number: [id number] Patient Account Number: [id number] Date of Birth/Sex: Treating RN: 04/17/1970 (50 y.o. [name] Primary Care Doloris Servantes: [name] Other  Clinician: Referring Welda Azzarello: Treating Ashden Sonnenberg/Extender: [name], [name] in Treatment: 29 Encounter Discharge Information Items Discharge Condition: Stable Ambulatory Status: Cane Discharge Destination: Home Transportation: Private Auto Accompanied By: self Schedule Follow-up Appointment: Yes Clinical Summary of Care: Patient Declined Electronic Signature(s) Signed: 03/27/2021 1:50:30 PM By: Baruch Gouty RN, BSN Entered By: Baruch Gouty on 03/27/2021 08:59:27 -------------------------------------------------------------------------------- Pain Assessment Details Patient Name: Date of Service: [name] RD, [name] 03/27/2021 8:30 A M Medical Record Number: [id number] Patient Account Number: [id number] Date of Birth/Sex: Treating RN: [birth year] (50 y.o. [name] Primary Care Chrisean Kloth: [name] Other Clinician: Referring Merril Nagy: Treating Adrian Specht/Extender: [name], [name] in Treatment: 29 Active Problems Location of Pain Severity and Description of Pain Patient Has Paino Yes Site Locations Pain Location: Pain Location: Pain in Ulcers With Dressing Change: No Duration of the Pain. Constant / Intermittento Intermittent Rate the pain. Current Pain Level: 3 Character of Pain Describe the Pain: Tender, Other: sore Pain Management and Medication Current Pain Management: Medication: Yes Is the Current Pain Management Adequate: Adequate How does your wound impact your activities of daily livingo Sleep: No Bathing: No Appetite: No Relationship With Others: No Bladder Continence: No Emotions: No Bowel Continence: No Work: No Toileting: No Drive: No Dressing: No Hobbies: No Electronic Signature(s) Signed: 03/27/2021 1:50:30 PM By: Baruch Gouty RN, BSN Entered By: Baruch Gouty on 03/27/2021 08:57:32 -------------------------------------------------------------------------------- Patient/Caregiver  Education Details Patient Name: Date of Service: [name] RD, [place] 12/30/2022andnbsp8:30 [place] Record Number: [id number] Patient Account Number: [id number] Date of Birth/Gender: Treating RN: June 13, 1970 (50 y.o. [name] Primary Care Physician: [name] Other Clinician: Referring Physician: Treating Physician/Extender: [name] Weeks in Treatment: 29 Education Assessment Education Provided To: Patient Education Topics Provided Venous: Methods: Explain/Verbal Responses: Reinforcements needed, State content correctly Wound/Skin Impairment: Methods:  Explain/Verbal Responses: Reinforcements needed, State content correctly Electronic Signature(s) Signed: 03/27/2021 1:50:30 PM By: Baruch Gouty RN, BSN Entered By: Baruch Gouty on 03/27/2021 08:59:12 -------------------------------------------------------------------------------- Wound Assessment Details Patient Name: Date of Service: [name], [name] 03/27/2021 8:30 A M Medical Record Number: [id number] Patient Account Number: [id number] Date of Birth/Sex: Treating RN: 1971-01-20 (50 y.o. [name] Primary Care Shanieka Blea: [name] Other Clinician: Referring Ashlin Hidalgo: Treating Coline Calkin/Extender: [name], [name] in Treatment: 29 Wound Status Wound Number: 3 Primary Diabetic Wound/Ulcer of the Lower Extremity Etiology: Wound Location: Right, Proximal, Lateral Foot Wound Open Wounding Event: Gradually Appeared Status: Date Acquired: 10/29/2020 Comorbid Anemia, Coronary Artery Disease, Hypertension, Type II Weeks Of Treatment: 21 History: Diabetes, Osteomyelitis, Neuropathy Clustered Wound: No Wound Measurements Length: (cm) 0.4 Width: (cm) 0.2 Depth: (cm) 0.2 Area: (cm) 0.063 Volume: (cm) 0.013 % Reduction in Area: 50% % Reduction in Volume: 85.2% Epithelialization: Medium (34-66%) Wound Description Classification: Grade 3 Wound  Margin: Distinct, outline attached Exudate Amount: Medium Exudate Type: Serosanguineous Exudate Color: red, brown Foul Odor After Cleansing: No Slough/Fibrino Yes Wound Bed Granulation Amount: Large (67-100%) Exposed Structure Granulation Quality: Pink, Pale Fascia Exposed: No Necrotic Amount: None Present (0%) Fat Layer (Subcutaneous Tissue) Exposed: Yes Tendon Exposed: No Muscle Exposed: No Joint Exposed: No Bone Exposed: No Treatment Notes Wound #3 (Foot) Wound Laterality: Right, Lateral, Proximal Cleanser Peri-Wound Care Topical Primary Dressing KerraCel Ag Gelling Fiber Dressing, 2x2 in (silver alginate) Discharge Instruction: Apply silver alginate to wound bed as instructed Secondary Dressing Woven Gauze Sponge, Non-Sterile 4x4 in Discharge Instruction: Apply over primary dressing as directed. Secured With Compression Wrap Kerlix Roll 4.5x3.1 (in/yd) Discharge Instruction: Apply Kerlix and Coban compression as directed. Coban Self-Adherent Wrap 4x5 (in/yd) Discharge Instruction: Apply over Kerlix as directed. Compression Stockings Add-Ons Electronic Signature(s) Signed: 03/27/2021 1:50:30 PM By: Baruch Gouty RN, BSN Entered By: Baruch Gouty on 03/27/2021 08:55:34 -------------------------------------------------------------------------------- Wound Assessment Details Patient Name: Date of Service: [name] RD, [name] 03/27/2021 8:30 A M Medical Record Number: [id number] Patient Account Number: [id number] Date of Birth/Sex: Treating RN: 1970-06-18 (50 y.o. [name] Primary Care Fabian Coca: [name] Other Clinician: Referring Blakelee Allington: Treating Seidy Labreck/Extender: [name], [name] in Treatment: 29 Wound Status Wound Number: 7 Primary Diabetic Wound/Ulcer of the Lower Extremity Etiology: Wound Location: Right, Lateral, Plantar Foot Wound Open Wounding Event: Gradually Appeared Status: Date Acquired:  12/31/2020 Comorbid Anemia, Coronary Artery Disease, Hypertension, Type II Weeks Of Treatment: 12 History: Diabetes, Osteomyelitis, Neuropathy Clustered Wound: No Wound Measurements Length: (cm) 0.3 Width: (cm) 0.3 Depth: (cm) 0.7 Area: (cm) 0.071 Volume: (cm) 0.049 % Reduction in Area: 63.8% % Reduction in Volume: 75% Epithelialization: None Wound Description Classification: Grade 3 Wound Margin: Well defined, not attached Exudate Amount: Medium Exudate Type: Serosanguineous Exudate Color: red, brown Foul Odor After Cleansing: No Slough/Fibrino No Wound Bed Granulation Amount: Small (1-33%) Exposed Structure Granulation Quality: Pink, Pale Fascia Exposed: No Necrotic Amount: None Present (0%) Fat Layer (Subcutaneous Tissue) Exposed: Yes Tendon Exposed: No Muscle Exposed: No Joint Exposed: No Bone Exposed: Yes Treatment Notes Wound #7 (Foot) Wound Laterality: Plantar, Right, Lateral Cleanser Peri-Wound Care Topical Primary Dressing KerraCel Ag Gelling Fiber Dressing, 2x2 in (silver alginate) Discharge Instruction: Apply silver alginate to wound bed as instructed Secondary Dressing Woven Gauze Sponge, Non-Sterile 4x4 in Discharge Instruction: Apply over primary dressing as directed. Secured With Compression Wrap Kerlix Roll 4.5x3.1 (in/yd) Discharge Instruction: Apply Kerlix and Coban compression as directed. Coban Self-Adherent Wrap 4x5 (in/yd) Discharge  Instruction: Apply over Kerlix as directed. Compression Stockings Add-Ons Electronic Signature(s) Signed: 03/27/2021 1:50:30 PM By: Baruch Gouty RN, BSN Entered By: Baruch Gouty on 03/27/2021 08:55:54 -------------------------------------------------------------------------------- [name] Details Patient Name: Date of Service: [name] RD, [name] 03/27/2021 8:30 A M Medical Record Number: [id number] Patient Account Number: [id number] Date of Birth/Sex: Treating RN: 04/20/1970 (50 y.o. [name] Primary Care Taresa [place]ville: [name] Other Clinician: Referring Mckoy Bhakta: Treating Elvyn Krohn/Extender: [name], [name] in Treatment: 29 Vital Signs Time Taken: 08:34 Temperature (F): 97.9 Height (in): 65 Pulse (bpm): 101 Weight (lbs): 196 Respiratory Rate (breaths/min): 18 Body Mass Index (BMI): 32.6 Blood Pressure (mmHg): 171/102 Capillary Blood Glucose (mg/dl): 146 Reference Range: 80 - 120 mg / dl Notes glucose per pt report this am Electronic Signature(s) Signed: 03/27/2021 1:50:30 PM By: Baruch Gouty RN, BSN Entered By: Baruch Gouty on 03/27/2021 08:34:49

## 2021-03-28 ENCOUNTER — Other Ambulatory Visit: Payer: Self-pay | Admitting: Family

## 2021-03-28 DIAGNOSIS — E1142 Type 2 diabetes mellitus with diabetic polyneuropathy: Secondary | ICD-10-CM

## 2021-03-30 ENCOUNTER — Other Ambulatory Visit: Payer: Self-pay | Admitting: Family

## 2021-03-30 DIAGNOSIS — I10 Essential (primary) hypertension: Secondary | ICD-10-CM

## 2021-03-31 ENCOUNTER — Encounter (HOSPITAL_BASED_OUTPATIENT_CLINIC_OR_DEPARTMENT_OTHER): Payer: 59 | Attending: Internal Medicine | Admitting: Internal Medicine

## 2021-03-31 ENCOUNTER — Other Ambulatory Visit: Payer: Self-pay

## 2021-03-31 DIAGNOSIS — L97522 Non-pressure chronic ulcer of other part of left foot with fat layer exposed: Secondary | ICD-10-CM | POA: Insufficient documentation

## 2021-03-31 DIAGNOSIS — E11621 Type 2 diabetes mellitus with foot ulcer: Secondary | ICD-10-CM | POA: Insufficient documentation

## 2021-03-31 DIAGNOSIS — L84 Corns and callosities: Secondary | ICD-10-CM | POA: Diagnosis not present

## 2021-03-31 DIAGNOSIS — L97514 Non-pressure chronic ulcer of other part of right foot with necrosis of bone: Secondary | ICD-10-CM | POA: Insufficient documentation

## 2021-03-31 DIAGNOSIS — F17218 Nicotine dependence, cigarettes, with other nicotine-induced disorders: Secondary | ICD-10-CM | POA: Insufficient documentation

## 2021-03-31 DIAGNOSIS — E1169 Type 2 diabetes mellitus with other specified complication: Secondary | ICD-10-CM | POA: Diagnosis present

## 2021-03-31 DIAGNOSIS — M86671 Other chronic osteomyelitis, right ankle and foot: Secondary | ICD-10-CM | POA: Insufficient documentation

## 2021-03-31 LAB — GLUCOSE, CAPILLARY
Glucose-Capillary: 182 mg/dL — ABNORMAL HIGH (ref 70–99)
Glucose-Capillary: 111 mg/dL — ABNORMAL HIGH (ref 70–99)
Glucose-Capillary: 220 mg/dL — ABNORMAL HIGH (ref 70–99)
Glucose-Capillary: 173 mg/dL — ABNORMAL HIGH (ref 70–99)

## 2021-03-31 NOTE — Progress Notes (Signed)
°  Subjective:  Patient ID: [name], male    DOB: [birth year],  MRN: [id number]  Chief Complaint  Patient presents with   Wound Check    Wound care   DOS: 07/29/20 Procedures:             1) Debridement of wound down to and including bone.  51 y.o. male presents with the above complaint. History confirmed with patient.  Denies new pedal issues. Has been going to HBOT and of note patient experienced otitis and underwent ear tubes bilat.  Objective:  Physical Exam: tenderness at the surgical site, local edema noted and calf supple, nontender. 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 Ulcer of left foot, limited to breakdown of skin [facility])     Plan:  Patient was evaluated and treated and all questions answered.  Right foot wound -Appears healed, continued HBOT per wound care  Large callus left foot -Again debrided but not true ulcer noted left. -Would benefit from DM shoes and toe filler right once he is done with HBOT.  No follow-ups on file.

## 2021-03-31 NOTE — Progress Notes (Addendum)
[name], [name] ([id number]) Visit Report for 03/31/2021 HBO Details Patient Name: Date of Service: [name], [place]. 03/31/2021 10:00 A M Medical Record Number: [id number] Patient Account Number: [id number] Date of Birth/Sex: Treating RN: [birth year] (51 y.o. [name] Primary Care Athalee Esterline: [name] Other Clinician: [name] Referring Burhanuddin Kohlmann: Treating Mckynlie Vanderslice/Extender: [name], [name] in Treatment: 29 HBO Treatment Course Details Treatment Course Number: 1 Ordering Naleah Kofoed: [name] Treatments Ordered: otal 60 HBO Treatment Start Date: 12/08/2020 HBO Indication: Diabetic Ulcer(s) of the Lower Extremity HBO Treatment Details Treatment Number: 46 Patient Type: Outpatient Chamber Type: Monoplace Chamber Serial #: [id number] Treatment Protocol: 2.5 ATA with 90 minutes oxygen, with two 5 minute air breaks Treatment Details Compression Rate Down: 1.5 psi / minute De-Compression Rate Up: 2.0 psi / minute A breaks and breathing ir Compress Tx Pressure periods Decompress Decompress Begins Reached (leave unused spaces Begins Ends blank) Chamber Pressure (ATA 1 2.5 2.5 2.5 2.5 2.5 - - 2.5 1 ) Clock Time (24 hr) 10:19 10:32 11:02 11:07 11:37 11:42 - - 12:12 12:23 Treatment Length: 124 (minutes) Treatment Segments: 4 Vital Signs Capillary Blood Glucose Reference Range: 80 - 120 mg / dl HBO Diabetic Blood Glucose Intervention Range: <131 mg/dl or >249 mg/dl Time Vitals Blood Respiratory Capillary Blood Glucose Pulse Action Type: Pulse: Temperature: Taken: Pressure: Rate: Glucose (mg/dl): Meter #: Oximetry (%) Taken: Pre 10:06 139/84 95 18 97.8 220 Post 12:26 134/83 89 18 97.9 173 Treatment Response Treatment Completion Status: Treatment Completed without Adverse Event Additional Procedure Documentation Tissue Sevierity: Bone involvement without necrosis [place] [facility] Notes No concerns with treatment given Physician HBO  Attestation: I certify that I supervised this HBO treatment in accordance with Medicare guidelines. A trained emergency response team is readily available per Yes hospital policies and procedures. Continue HBOT as ordered. Yes Electronic Signature(s) Signed: 03/31/2021 3:57:23 PM By: [name] MD Previous Signature: 03/31/2021 1:36:05 PM Version By: [name] [name] , , Entered By: [name] on 03/31/2021 14:58:11 -------------------------------------------------------------------------------- HBO Safety Checklist Details Patient Name: Date of Service: [name] RD, [place]. 03/31/2021 10:00 A M Medical Record Number: [id number] Patient Account Number: [id number] Date of Birth/Sex: Treating RN: 1970-04-18 (51 y.o. [name] Primary Care Jac Romulus: [name] Other Clinician: [name] Referring Lanina Larranaga: Treating Aracelis Ulrey/Extender: [name], [name] in Treatment: 29 HBO Safety Checklist Items Safety Checklist Consent Form Signed Patient voided / foley secured and emptied When did you last eato 0800 Last dose of injectable or oral agent 0730 Ostomy pouch emptied and vented if applicable NA All implantable devices assessed, documented and approved NA Intravenous access site secured and place NA Valuables secured Linens and cotton and cotton/polyester blend (less than 51% polyester) Personal oil-based products / skin lotions / body lotions removed Wigs or hairpieces removed NA Smoking or tobacco materials removed Books / newspapers / magazines / loose paper removed Cologne, aftershave, perfume and deodorant removed Jewelry removed (may wrap wedding band) Make-up removed NA Hair care products removed Battery operated devices (external) removed Heating patches and chemical warmers removed Titanium eyewear removed NA Nail polish cured greater than 10 hours NA Casting material cured greater than 10 hours NA Hearing  aids removed NA Loose dentures or partials removed NA Prosthetics have been removed NA Patient demonstrates correct use of air break device (if applicable) Patient concerns have been addressed Patient grounding bracelet on and cord attached to chamber Specifics for Inpatients (complete in addition to above) Medication  sheet sent with patient NA Intravenous medications needed or due during therapy sent with patient NA Drainage tubes (e.g. nasogastric tube or chest tube secured and vented) NA Endotracheal or Tracheotomy tube secured NA Cuff deflated of air and inflated with saline NA Airway suctioned NA Notes Paper version used prior to treatment. Electronic Signature(s) Signed: 03/31/2021 1:57:11 PM By: [name] [name] , , Previous Signature: 03/31/2021 11:33:23 AM Version By: [name] [name] , , Entered By: [name] on 03/31/2021 13:57:10

## 2021-03-31 NOTE — Progress Notes (Signed)
DARRY, KELNHOFER ([id number]) Visit Report for 03/31/2021 Super[place] Details Patient Name: Date of Service: [place] RD, [name] 03/31/2021 Medical Record Number: [id number] Patient Account Number: [id number] Date of Birth/Sex: Treating RN: 1970-04-17 (51 y.o. [name] Primary Care Provider: [name] Other Clinician: [name] Referring Provider: Treating Provider/Extender: [name], [name] in Treatment: 29 Diagnosis Coding ICD-10 Codes Code Description [phone] Other chronic osteomyelitis, right ankle and foot E11.621 Type 2 diabetes mellitus with foot ulcer L97.514 Non-pressure chronic ulcer of other part of right foot with necrosis of bone T81.31XS Disruption of external operation (surgical) wound, not elsewhere classified, sequela F17.218 Nicotine dependence, cigarettes, with other nicotine-induced disorders L97.522 Non-pressure chronic ulcer of other part of left foot with fat layer exposed L84 Corns and callosities Facility Procedures CPT4 Code Description Modifier Quantity [id number] G0277-(Facility Use Only) HBOT full body chamber, 60min , 4 ICD-10 Diagnosis Description E11.621 Type 2 diabetes mellitus with foot ulcer M86.671 Other chronic osteomyelitis, right ankle and foot Physician Procedures Quantity CPT4 Code Description Modifier [id number] - WC PHYS HYPERBARIC OXYGEN THERAPY 1 ICD-10 Diagnosis Description E11.621 Type 2 diabetes mellitus with foot ulcer M86.671 Other chronic osteomyelitis, right ankle and foot Electronic Signature(s) Signed: 03/31/2021 1:36:52 PM By: [name] [name] , , Signed: 03/31/2021 3:57:23 PM By: [name] MD Entered By: [name] on 03/31/2021 13:36:49

## 2021-03-31 NOTE — Progress Notes (Addendum)
SHANARD, TRETO ([id number]) Visit Report for 03/31/2021 Arrival Information Details Patient Name: Date of Service: [address], [place]. 03/31/2021 10:00 A M Medical Record Number: [id number] Patient Account Number: [id number] Date of Birth/Sex: Treating RN: Apr 03, 1970 (51 y.o. [name] Primary Care Selicia Windom: [name] Other Clinician: [name] Referring Rohini Jaroszewski: Treating Renea Schoonmaker/Extender: [name], [name] in Treatment: 29 Visit Information History Since Last Visit All ordered tests and consults were completed: Yes Patient Arrived: Cane Added or deleted any medications: No Arrival Time: 09:41 Any new allergies or adverse reactions: No Accompanied By: self Had a fall or experienced change in No Transfer Assistance: None activities of daily living that may affect Patient Identification Verified: Yes risk of falls: Secondary Verification Process Completed: Yes Signs or symptoms of abuse/neglect since last visito No Patient Requires Transmission-Based Precautions: No Hospitalized since last visit: No Patient Has Alerts: No Implantable device outside of the clinic excluding No cellular tissue based products placed in the center since last visit: Pain Present Now: No Electronic Signature(s) Signed: 03/31/2021 11:31:25 AM By: [name] [name] , , Entered By: [name] on 03/31/2021 11:31:24 -------------------------------------------------------------------------------- Encounter Discharge Information Details Patient Name: Date of Service: [name], [place]. 03/31/2021 10:00 A M Medical Record Number: [id number] Patient Account Number: [id number] Date of Birth/Sex: Treating RN: [birth year] (51 y.o. [name] Primary Care Sevin Langenbach: [name] Other Clinician: [name] Referring Johnny Gorter: Treating Velicia Dejager/Extender: [name], [name] in Treatment: 29 Encounter Discharge Information  Items Discharge Condition: Stable Ambulatory Status: Cane Discharge Destination: Home Transportation: Private Auto Accompanied By: self Schedule Follow-up Appointment: No Clinical Summary of Care: Electronic Signature(s) Signed: 03/31/2021 1:40:08 PM By: [name] [name] , , Entered By: [name] on 03/31/2021 13:40:07 -------------------------------------------------------------------------------- [place] Details Patient Name: Date of Service: [name], [place]. 03/31/2021 10:00 A M Medical Record Number: [id number] Patient Account Number: [id number] Date of Birth/Sex: Treating RN: [birth year] (51 y.o. [name] Primary Care Durelle Zepeda: [name] Other Clinician: [name] Referring Victorine Mcnee: Treating Antuan Limes/Extender: [name], [name] in Treatment: 29 Vital Signs Time Taken: 10:06 Temperature (F): 97.8 Height (in): 65 Pulse (bpm): 95 Weight (lbs): 196 Respiratory Rate (breaths/min): 18 Body Mass Index (BMI): 32.6 Blood Pressure (mmHg): 139/84 Capillary Blood Glucose (mg/dl): 220 Reference Range: 80 - 120 mg / dl Electronic Signature(s) Signed: 03/31/2021 11:32:09 AM By: [name] [name] , , Entered By: [name] on 03/31/2021 11:32:08

## 2021-04-01 ENCOUNTER — Encounter (HOSPITAL_BASED_OUTPATIENT_CLINIC_OR_DEPARTMENT_OTHER): Payer: 59 | Admitting: Physician Assistant

## 2021-04-01 ENCOUNTER — Other Ambulatory Visit (HOSPITAL_COMMUNITY): Payer: Self-pay | Admitting: Physician Assistant

## 2021-04-01 ENCOUNTER — Other Ambulatory Visit: Payer: Self-pay | Admitting: Physician Assistant

## 2021-04-01 ENCOUNTER — Ambulatory Visit (INDEPENDENT_AMBULATORY_CARE_PROVIDER_SITE_OTHER): Payer: 59 | Admitting: Family

## 2021-04-01 ENCOUNTER — Encounter: Payer: Self-pay | Admitting: Family

## 2021-04-01 VITALS — BP 128/85 | HR 98 | Temp 98.0°F | Resp 18 | Ht 64.96 in | Wt 190.4 lb

## 2021-04-01 DIAGNOSIS — L97514 Non-pressure chronic ulcer of other part of right foot with necrosis of bone: Secondary | ICD-10-CM

## 2021-04-01 DIAGNOSIS — E1142 Type 2 diabetes mellitus with diabetic polyneuropathy: Secondary | ICD-10-CM

## 2021-04-01 DIAGNOSIS — Z794 Long term (current) use of insulin: Secondary | ICD-10-CM

## 2021-04-01 DIAGNOSIS — E11621 Type 2 diabetes mellitus with foot ulcer: Secondary | ICD-10-CM | POA: Diagnosis not present

## 2021-04-01 NOTE — Progress Notes (Addendum)
AHREN, PETTINGER ([id number]) Visit Report for 04/01/2021 Chief Complaint Document Details Patient Name: Date of Service: [address], [place] 04/01/2021 12:45 PM Medical Record Number: [id number] Patient Account Number: [id number] Date of Birth/Sex: Treating RN: 11-Apr-1970 (51 y.o. [name] Primary Care Provider: [name] Other Clinician: Referring Provider: Treating Provider/Extender: [name], [name] in Treatment: 29 Information Obtained from: Patient Chief Complaint 09/05/2020; patient is here for review of the wound extensively on a right TMA amputation site. He also has a wound on the plantar aspect of the left foot Electronic Signature(s) Signed: 04/01/2021 1:01:14 PM By: Worthy Keeler PA-C Entered By: Worthy Keeler on 04/01/2021 13:01:14 -------------------------------------------------------------------------------- Debridement Details Patient Name: Date of Service: [name], [place] [name] 04/01/2021 12:45 PM Medical Record Number: [id number] Patient Account Number: [id number] Date of Birth/Sex: Treating RN: 1970/11/30 (51 y.o. [name] Primary Care Provider: [name] Other Clinician: Referring Provider: Treating Provider/Extender: [name], [name] in Treatment: 29 Debridement Performed for Assessment: Wound #7 Right,Lateral,Plantar Foot Performed By: Physician Worthy Keeler, PA Debridement Type: Debridement Severity of Tissue Pre Debridement: Fat layer exposed Level of Consciousness (Pre-procedure): Awake and Alert Pre-procedure Verification/Time Out Yes - 13:07 Taken: Start Time: 13:08 Pain Control: Other : Benzocaine T Area Debrided (L x W): otal 0.3 (cm) x 0.3 (cm) = 0.09 (cm) Tissue and other material debrided: Callus, Subcutaneous, Skin: Dermis Level: Skin/Subcutaneous Tissue Debridement Description: Excisional Instrument: Curette Bleeding: Minimum Hemostasis Achieved: Pressure End Time:  13:11 Response to Treatment: Procedure was tolerated well Level of Consciousness (Post- Awake and Alert procedure): Post Debridement Measurements of Total Wound Length: (cm) 0.3 Width: (cm) 0.3 Depth: (cm) 2 Volume: (cm) 0.141 Character of Wound/Ulcer Post Debridement: Stable Severity of Tissue Post Debridement: Fat layer exposed Post Procedure Diagnosis Same as Pre-procedure Electronic Signature(s) Signed: 04/01/2021 4:51:59 PM By: Worthy Keeler PA-C Signed: 04/01/2021 5:15:33 PM By: Lorrin Jackson Entered By: Lorrin Jackson on 04/01/2021 13:12:59 -------------------------------------------------------------------------------- Debridement Details Patient Name: Date of Service: [name], [name] 04/01/2021 12:45 PM Medical Record Number: [id number] Patient Account Number: [id number] Date of Birth/Sex: Treating RN: 1970-08-15 (51 y.o. [name] Primary Care Provider: [name] Other Clinician: Referring Provider: Treating Provider/Extender: [name], [name] in Treatment: 29 Debridement Performed for Assessment: Wound #3 Right,Proximal,Lateral Foot Performed By: Physician Worthy Keeler, PA Debridement Type: Debridement Severity of Tissue Pre Debridement: Fat layer exposed Level of Consciousness (Pre-procedure): Awake and Alert Pre-procedure Verification/Time Out Yes - 13:07 Taken: Start Time: 13:11 Pain Control: Other : Benzocaine T Area Debrided (L x W): otal 0.5 (cm) x 0.2 (cm) = 0.1 (cm) Tissue and other material debrided: Callus, Subcutaneous, Skin: Dermis Level: Skin/Subcutaneous Tissue Debridement Description: Excisional Instrument: Curette Bleeding: Minimum Hemostasis Achieved: Pressure End Time: 13:14 Response to Treatment: Procedure was tolerated well Level of Consciousness (Post- Awake and Alert procedure): Post Debridement Measurements of Total Wound Length: (cm) 0.5 Width: (cm) 0.2 Depth: (cm) 2.2 Volume: (cm)  0.173 Character of Wound/Ulcer Post Debridement: Stable Severity of Tissue Post Debridement: Fat layer exposed Post Procedure Diagnosis Same as Pre-procedure Electronic Signature(s) Signed: 04/01/2021 4:51:59 PM By: Worthy Keeler PA-C Signed: 04/01/2021 5:15:33 PM By: Lorrin Jackson Entered By: Lorrin Jackson on 04/01/2021 13:14:14 -------------------------------------------------------------------------------- HPI Details Patient Name: Date of Service: [name], [name] 04/01/2021 12:45 PM Medical Record Number: [id number] Patient Account Number: [id number] Date of Birth/Sex: Treating RN: Sep 24, 1970 (51 y.o. [name] Primary Care Provider:  Minette Brine, Amy Other Clinician: Referring Provider: Treating Provider/Extender: [name], [name] in Treatment: 29 History of Present Illness HPI Description: ADMISSION 09/05/2020 This is a 51 year old man who has type 2 diabetes. Essentially the problem began admitted to [facility] health in December with infection gas gangrene. Cultures ultimately grew staph lugdunensis. 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 March Rummage. Shortly thereafter he developed a nonhealing wound with wound dehiscence noted on 05/09/2020. He had a wound VAC I think for a period of time after the surgery but it did not help. He has undergone an IandD and skin graft on the right foot on 05/16/2020. He underwent a further IandD on 06/04/2020 not debrided to bone. 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 Culture of the metatarsal grew Enterobacter cloacae I. He has been followed by Dr. [name] of infectious disease. Currently on Levaquin and doxycycline I think at about the 4-week of 6 mark. At the last visit with Dr. March Rummage he was supposed to have use Santyl although I do not think he has it and he has been using I think a Betadine wet-to-dry. He is  referred here by Dr. [name] for our review who saw him yesterday. I cannot see that he has had imaging studies of the right foot. No MRI Past medical history includes type 2 diabetes, in 2021 and ulcer of the left foot that ultimately healed with a wound VAC, stage III chronic kidney disease, first toe amputations bilaterally, hypertension, still a 1/2 pack/day smoker. The patient had ABIs on 08/09/2018 which was 1.00 on the right and 0.96 on the left both areas had triphasic waveforms. Not felt to have an arterial issue. 09/12/2020; 1 week follow-up. He has been doing the dressings along with home health. This is a deep punched out area in the incision of his transmit site. We use silver alginate last week to help with drainage and antibacterial properties. He tolerated this well 6/24; the patient was actually at his podiatrist this morning who debrided the wound. We have been using silver alginate. They are going to follow him in 6 weeks. 6/29; patient is using silver alginate. Apparently saw Dr. March Rummage this week although I have not checked his note I will try to do so. He is following up in 6 weeks. This was the original transmetatarsal amputation when I first saw this 3 mid part of his amputation site was deep down to bone however this seems to have progressively close down which is gratifying to see. He still has a fairly callus uneven surface however spreading this apart its difficult to identify anything that does not look epithelialized. When I first saw this I thought he would require an extensive debridement perhaps back in the OR by Dr. March Rummage however all of this appears to be looking better and at this point I would continue with the silver alginate see if this holds together over the next several weeks. He does not have an arterial issue 7/13; 2-week follow-up. Unfortunately the area did not hold together there was drainage on his dressing. Still copious amounts of callus. It was difficult to  see where this actually was open therefore I elected to go ahead with a vigorous debridement. Still using silver alginate 7/20 I brought him back after extensive callus debridement last week. The only open area was on the medial part of the foot extensive debridement of thick surrounding  callus and I was able to do identify the remaining tunneling area. 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 This is the area that we may have been following. This is smaller this week but still surrounded with thick callus He came in today with a large area of callus on the left midfoot. This I had noticed previously. HOWEVER our intake nurse clearly incorrectly documented this as opening. The callus and split there was an odor so all of this had to be removed. 10/29/2020 unfortunately upon evaluation today this patient appears to have significant mount of callus noted at this point. There does not appear to be any signs of active infection systemically which is great although locally he is having some discomfort around the lateral portion of his foot on the right. The left foot is no having a lot of callus I am not certain even has an open wound if there is anything is extremely tiny. 11/05/2020 upon evaluation today patient's wounds actually appear to be doing a bit better compared to last week since we have uncovered some of these areas I think there are some definite improvements. With that being said there is still quite a bit of tunneling and undermining at many locations that again has me concerned I still think that the MRI is the best thing to do he has that scheduled for Saturday. 11/26/2020 upon evaluation today patient appears to be doing decently well in regard to his foot in general. He did have an MRI I did review this and he had multiple findings on MRI consistent with osteomyelitis across the board in regard pretty much to his remaining  metatarsal regions. Nonetheless there was also some evidence of some involvement of the navicular bone as well. Either way I think that this patient actually is in desperate need of get him in for hyperbaric oxygen therapy we discussed that today that will be detailed in the plan. Nonetheless he is also still on the doxycycline which I think is appropriate for the time being as well I did go ahead and place him on that following the results of the MRI based on what we were seeing. I gave him that prescription for 14 days for the time being and we will go from there. 12/03/2020 upon evaluation today patient appears to be doing decently well in regard to his wounds in general in regard to the right foot. With that being said he does have a callus on the left foot in the end there was actually an area open area here that will need to be addressed. This is small and I think we take care of it now hopefully will heal quite readily. Fortunately I do not see any signs of infection worsening I am getting need to refill the antibiotic for him today. He has been taking doxycycline which seems to be doing well. We will also get a go ahead and get him approved for HBO therapy. 12/31/2020 upon evaluation today patient appears to be doing really roughly the same as compared to last time I saw him. Fortunately there is no evidence of active infection which is great news systemically he has started hyperbarics he seems to be doing decently well he said a couple times where anxiety kind of got to him but overall I think he is doing awesome. He is no longer on the antibiotic therapy. With that being said I do believe that he  is going to need likely some additional antibiotics based on what I am seeing today we can actually obtain a bone culture from the plantar wound that was hiding underneath the callus. He was having pain in this area which is what may be try to clear away the callus to look sure enough there was an  actual opening that went deeper into the wound bed region here. 01/21/2021 upon evaluation today patient appears to be doing about the same in regard to his foot in general. He does have some depth to some of the wound areas I really think he would benefit from is trying to see about getting him set up for a gauze VAC. I think that the gauze VAC could be helpful for him and will work much more effectively with what we are seeing as compared to traditional foam wound VAC. The patient is in agreement with giving this a trial. Nonetheless unfortunately think that he overall seems to be doing really well and I think we are headed in the right direction as far as the overall healing is concerned. 01/28/2021 upon evaluation today patient appears to be doing well with regard to his wound all things considered he does have a lot of callus buildup especially on the plantar aspect of the left foot. There is no wound here but a significant amount of callus. That is getting need to be addressed today. Subsequently he also has a lot of callus on the right foot as well as some of the area around the wounds actually being significantly callused. Again I need to clear this away today. 02/04/2021 upon evaluation today patient actually seems to be doing quite well with regard to his foot ulcer. I do believe that the hyperbaric oxygen therapy has been beneficial for him thus far also think that he is doing well with the wound VAC. In combination I think he has an excellent plan at this time as far as treatment is concerned. I do not see any signs of evidence of infection which is great news as well. 02/11/2021 upon evaluation today patient appears to be doing okay in regard to his foot ulcer though still has deep wounds that do not seem to be doing nearly as good as what I would like to see. I think that the wound VAC is helping but I do believe there still bone in the base of the wound I think we probably need to see  about put him back on an antibiotic, do a PCR culture today to see what we find in order to determine whether or not we need to have him on antibiotics which I think we probably do is just a matter which 1. 02/18/2021 upon evaluation today patient appears to be doing okay with regard to his foot ulcer. He has been using the wound VAC although there is a lot of drainage and to be honest I am not certain this is doing as well as we wanted to right now especially considering the fact the soles of gotten so small which in some ways is good but in other ways is still problematic for Sorbact standpoint. I am not even certain we will be able to reinitiate the VAC even next week. Nonetheless for now he is on the [place] which is definitely something that we need to get going I am also can see about a referral back to infectious disease to see what they have to say in regard to what is going on currently  as well. He voiced understanding and is in agreement with that plan. I did explain this is a very touchy go situation which hinges on [place] getting this healed and the infection improved if not he is looking at amputation. Obviously this is a limb salvage Plan at this point. 02/25/2021 upon evaluation today patient's wound bed actually showed signs of being much drier and much less macerated compared to last week. We had a wound VAC on hold for this week. With that being said based on what I am seeing I think we probably just want to cancel the wound VAC altogether. I do not think it was doing enough for him to warrant the maceration and risks that are associated. He is on the [place] at this point. He also has an infection disease appointment next week. We will see what they have to say at this juncture. Nonetheless currently he is wrapping up his first round of hyperbaric oxygen therapy we could consider repeat although the biggest issue right now is simply that he actually is having surgery on his limbs due to  cataract issues that is tomorrow. Following that surgery I am not certain he really needs to be in the chamber for some period of time we need to see what his surgeon says. 03/04/2021 upon evaluation today patient's foot actually does appear to be doing better which is great news. I do not see any signs of infection he has been tolerating the [place] though he is done with that currently I think we probably need to reopen this for a little bit longer to ensure that he is completely treated. He is in agreement with this plan. Subsequently I will send that into the pharmacy for him today. 03/11/2021 upon evaluation today patient's wounds on the foot actually appear to still be doing about the same. I do not see any evidence of significant improvement which is not good although it is also not significantly worse which is good. It overall he is going to start back on hyperbarics this coming Monday. Obviously I think that can be beneficial for him as well and is going to likely help out with getting this hopefully close. 03/18/2021 upon evaluation today patient appears to be doing still somewhat poorly in regard to his foot unfortunately is also having some issues with his ear he is having to go to ENT today to see what they can do to help out in this regard. Fortunately I do not see any signs of active infection which is great news. No fevers, chills, nausea, vomiting, or diarrhea. 04/01/2020 upon evaluation today patient appears to be doing about the same in regard to his foot. Unfortunately I am just not seeing a whole lot of improvement currently. I think that he probably needs to go back and see the surgeon to see if there is anything they can do to try to help with closure here. Obviously I do not see any signs that the infection is worsening and overall I think that I am still hopeful that the hyperbarics may be helping with the osteomyelitis to some degree but again I am going to repeat an MRI currently  his last 1 was in August and it showed fairly extensive osteomyelitis. I want to see if anything has improved since then and that will give Dr. March Rummage updated information as well when he sees the patient in order to make any adjustments as necessary. Electronic Signature(s) Signed: 04/01/2021 1:21:02 PM By: Worthy Keeler PA-C Entered  By: Worthy Keeler on 04/01/2021 13:21:01 -------------------------------------------------------------------------------- Physical Exam Details Patient Name: Date of Service: [place] RD, [name] 04/01/2021 12:45 PM Medical Record Number: [id number] Patient Account Number: [id number] Date of Birth/Sex: Treating RN: May 30, 1970 (51 y.o. [name] Primary Care Provider: [name] Other Clinician: Referring Provider: Treating Provider/Extender: [name], [name] in Treatment: 51 Constitutional Well-nourished and well-hydrated in no acute distress. Respiratory normal breathing without difficulty. Psychiatric this patient is able to make decisions and demonstrates good insight into disease process. Alert and Oriented x 3. pleasant and cooperative. Notes Upon inspection patient's wound bed actually showed signs still having 2 sinus tracts that go all the way down to the bone both connect in the base of the wound. This is essentially 1 wound area with 2 sinus tracts draining. Unfortunately I just do not think because of the location of the wound and how this is that he is really showing signs of significant improvement he still has purulent drainage noted and again the hyperbarics along with the antibiotics we have been doing everything else I think of Things at [place] but again would not really see any improvement that I would like to see. Electronic Signature(s) Signed: 04/01/2021 1:21:36 PM By: Worthy Keeler PA-C Entered By: Worthy Keeler on 04/01/2021  13:21:35 -------------------------------------------------------------------------------- Physician Orders Details Patient Name: Date of Service: [name], [name] 04/01/2021 12:45 PM Medical Record Number: [id number] Patient Account Number: [id number] Date of Birth/Sex: Treating RN: Jan 04, 1971 (51 y.o. [name] Primary Care Provider: [name] Other Clinician: Referring Provider: Treating Provider/Extender: [name], [name] in Treatment: 29 Verbal / Phone Orders: No Diagnosis Coding ICD-10 Coding Code Description [phone] Other chronic osteomyelitis, right ankle and foot E11.621 Type 2 diabetes mellitus with foot ulcer L97.514 Non-pressure chronic ulcer of other part of right foot with necrosis of bone T81.31XS Disruption of external operation (surgical) wound, not elsewhere classified, sequela F17.218 Nicotine dependence, cigarettes, with other nicotine-induced disorders L97.522 Non-pressure chronic ulcer of other part of left foot with fat layer exposed L84 Corns and callosities Follow-up Appointments ppointment in 1 week. - with Margarita Grizzle Return A Bathing/ Shower/ Hygiene May shower with protection but do not get wound dressing(s) wet. Edema Control - Lymphedema / SCD / Other Elevate legs to the level of the heart or above for 30 minutes daily and/or when sitting, a frequency of: Avoid standing for long periods of time. Moisturize legs daily. - to foot with dressing changes Off-Loading Open toe surgical shoe to: - Wear at all times except driving. on right foot Other: - may wear shoe with memory foam insert to left foot, cut hole where callous is and insert in shoe to help offload Additional Orders / Instructions Stop/Decrease Smoking Follow Nutritious Diet Non Wound Condition Other Non Wound Condition Orders/Instructions: - cushion bottom of left foot with orthopedic felt or foam Hyperbaric Oxygen Therapy Evaluate for HBO Therapy Indication:  - wagner grade 3 diabetic foot ulcer of right foot If appropriate for treatment, begin HBOT per protocol: 2.5 ATA for 90 Minutes with 2 Five (5) Minute A Breaks ir Total Number of Treatments: - additional 20 One treatments per day (delivered Monday through Friday unless otherwise specified in Special Instructions below): Finger stick Blood Glucose Pre- and Post- HBOT Treatment. Follow Hyperbaric Oxygen Glycemia Protocol A frin (Oxymetazoline HCL) 0.05% nasal spray - 1 spray in both nostrils daily as needed prior to HBO treatment for difficulty clearing ears Wound Treatment  Wound #3 - Foot Wound Laterality: Right, Lateral, Proximal Cleanser: Soap and Water 1 x Per Week/30 Days Discharge Instructions: May shower and wash wound with dial antibacterial soap and water prior to dressing change. Cleanser: Wound Cleanser 1 x Per Week/30 Days Discharge Instructions: Cleanse the wound with wound cleanser prior to applying a clean dressing using gauze sponges, not tissue or cotton balls. Prim Dressing: KerraCel Ag Gelling Fiber Dressing, 4x5 in (silver alginate) 1 x Per Week/30 Days ary Discharge Instructions: Apply silver alginate to wound bed as instructed Secondary Dressing: Woven Gauze Sponge, Non-Sterile 4x4 in 1 x Per Week/30 Days Discharge Instructions: Apply over primary dressing as directed. Secondary Dressing: ABD Pad, 5x9 1 x Per Week/30 Days Discharge Instructions: Apply over primary dressing as directed. Compression Wrap: Kerlix Roll 4.5x3.1 (in/yd) 1 x Per Week/30 Days Discharge Instructions: Apply Kerlix and Coban compression as directed. Compression Wrap: Coban Self-Adherent Wrap 4x5 (in/yd) 1 x Per Week/30 Days Discharge Instructions: Apply over Kerlix as directed. Wound #7 - Foot Wound Laterality: Plantar, Right, Lateral Cleanser: Soap and Water 1 x Per Week/30 Days Discharge Instructions: May shower and wash wound with dial antibacterial soap and water prior to dressing  change. Cleanser: Wound Cleanser 1 x Per Week/30 Days Discharge Instructions: Cleanse the wound with wound cleanser prior to applying a clean dressing using gauze sponges, not tissue or cotton balls. Prim Dressing: KerraCel Ag Gelling Fiber Dressing, 4x5 in (silver alginate) 1 x Per Week/30 Days ary Discharge Instructions: Apply silver alginate to wound bed as instructed Secondary Dressing: Woven Gauze Sponge, Non-Sterile 4x4 in 1 x Per Week/30 Days Discharge Instructions: Apply over primary dressing as directed. Secondary Dressing: ABD Pad, 5x9 1 x Per Week/30 Days Discharge Instructions: Apply over primary dressing as directed. Compression Wrap: Kerlix Roll 4.5x3.1 (in/yd) 1 x Per Week/30 Days Discharge Instructions: Apply Kerlix and Coban compression as directed. Compression Wrap: Coban Self-Adherent Wrap 4x5 (in/yd) 1 x Per Week/30 Days Discharge Instructions: Apply over Kerlix as directed. Consults Podiatry - Referral to Dr. March Rummage regarding non-healing wounds and surgical options. - (ICD10 L97.514 - Non-pressure chronic ulcer of other part of right foot with necrosis of bone) Radiology MRI, lower extremity with and without contrast: Right Foot - Repeat MRI of right foot for non-healing wound with history of osteomyelitis. - (ICD10 L97.514 - Non-pressure chronic ulcer of other part of right foot with necrosis of bone) GLYCEMIA INTERVENTIONS PROTOCOL PRE-HBO GLYCEMIA INTERVENTIONS ACTION INTERVENTION Obtain pre-HBO capillary blood glucose (ensure 1 physician order is in chart). A. Notify HBO physician and await physician orders. 2 If result is 70 mg/dl or below: B. If the result meets the hospital definition of a critical result, follow hospital policy. A. Give patient an 8 ounce Glucerna Shake, an 8 ounce Ensure, or 8 ounces of a Glucerna/Ensure equivalent dietary supplement*. B. Wait 30 minutes. If result is 71 mg/dl to 130 mg/dl: C. Retest patients capillary blood  glucose (CBG). D. If result greater than or equal to 110 mg/dl, proceed with HBO. If result less than 110 mg/dl, notify HBO physician and consider holding HBO. If result is 131 mg/dl to 249 mg/dl: A. Proceed with HBO. A. Notify HBO physician and await physician orders. B. It is recommended to hold HBO and do If result is 250 mg/dl or greater: blood/urine ketone testing. C. If the result meets the hospital definition of a critical result, follow hospital policy. POST-HBO GLYCEMIA INTERVENTIONS ACTION INTERVENTION Obtain post HBO capillary blood glucose (ensure 1 physician order is in  chart). A. Notify HBO physician and await physician orders. 2 If result is 70 mg/dl or below: B. If the result meets the hospital definition of a critical result, follow hospital policy. A. Give patient an 8 ounce Glucerna Shake, an 8 ounce Ensure, or 8 ounces of a Glucerna/Ensure equivalent dietary supplement*. B. Wait 15 minutes for symptoms of If result is 71 mg/dl to 100 mg/dl: hypoglycemia (i.e. nervousness, anxiety, sweating, chills, clamminess, irritability, confusion, tachycardia or dizziness). C. If patient asymptomatic, discharge patient. If patient symptomatic, repeat capillary blood glucose (CBG) and notify HBO physician. If result is 101 mg/dl to 249 mg/dl: A. Discharge patient. A. Notify HBO physician and await physician orders. B. It is recommended to do blood/urine ketone If result is 250 mg/dl or greater: testing. C. If the result meets the hospital definition of a critical result, follow hospital policy. *Juice or candies are NOT equivalent products. If patient refuses the Glucerna or Ensure, please consult the hospital dietitian for an appropriate substitute. Electronic Signature(s) Signed: 04/01/2021 4:51:59 PM By: Worthy Keeler PA-C Signed: 04/01/2021 5:15:33 PM By: Lorrin Jackson Entered By: Lorrin Jackson on 04/01/2021 13:40:43 Prescription  04/01/2021 -------------------------------------------------------------------------------- Denton Brick PA Patient Name: Provider: 1970-09-07 [id number] Date of Birth: NPI#: Jerilynn Mages [id number] Sex: DEA #: [phone] Phone #: License #: [place] Patient Address: [address], [place] [address] [place], [place] [address] [phone] Allergies penicillin; bee venom protein (honey bee) Provider's Orders Podiatry - ICD10: L97.514 - Referral to Dr. March Rummage regarding non-healing wounds and surgical options. Hand Signature: Date(s): Prescription 04/01/2021 Denton Brick PA Patient Name: Provider: 1970/06/21 [id number] Date of Birth: NPI#: Jerilynn Mages [id number] Sex: DEA #: [phone] Phone #: License #: [place] Patient Address: [place] [address] [place], [place] [address] [place], [place] [address] [phone] Allergies penicillin; bee venom protein (honey bee) Provider's Orders MRI, lower extremity with and without contrast: Right Foot - ICD10: L97.514 - Repeat MRI of right foot for non-healing wound with history of osteomyelitis. Hand Signature: Date(s): Electronic Signature(s) Signed: 04/01/2021 4:51:59 PM By: Worthy Keeler PA-C Signed: 04/01/2021 5:15:33 PM By: Lorrin Jackson Entered By: Lorrin Jackson on 04/01/2021 13:40:44 -------------------------------------------------------------------------------- Problem List Details Patient Name: Date of Service: [name], [name] 04/01/2021 12:45 PM Medical Record Number: [id number] Patient Account Number: [id number] Date of Birth/Sex: Treating RN: [birth year] (52 y.o. [name] Primary Care Provider: [name] Other Clinician: Referring Provider: Treating Provider/Extender: [name], [name] in Treatment:  29 Active Problems ICD-10 Encounter Code Description Active Date MDM Diagnosis [phone] Other chronic osteomyelitis, right ankle and foot 09/05/2020 No Yes E11.621 Type 2 diabetes mellitus with foot ulcer 09/05/2020 No Yes L97.514 Non-pressure chronic ulcer of other part of right foot with necrosis of bone 09/05/2020 No Yes T81.31XS Disruption of external operation (surgical) wound, not elsewhere classified, 09/05/2020 No Yes sequela F17.218 Nicotine dependence, cigarettes, with other nicotine-induced disorders 11/26/2020 No Yes L97.522 Non-pressure chronic ulcer of other part of left foot with fat layer exposed 10/22/2020 No Yes L84 Corns and callosities 01/28/2021 No Yes Inactive Problems Resolved Problems Electronic Signature(s) Signed: 04/01/2021 1:00:15 PM By: Worthy Keeler PA-C Entered By: Worthy Keeler on 04/01/2021 13:00:15 -------------------------------------------------------------------------------- Progress Note Details Patient Name: Date of Service: [name], [place] [name] 04/01/2021 12:45 PM Medical Record Number: [id number] Patient Account  Number: [id number] Date of Birth/Sex: Treating RN: Feb 17, 1971 (51 y.o. [name] Primary Care Provider: [name] Other Clinician: Referring Provider: Treating Provider/Extender: [name], [name] in Treatment: 29 Subjective Chief Complaint Information obtained from Patient 09/05/2020; patient is here for review of the wound extensively on a right TMA amputation site. He also has a wound on the plantar aspect of the left foot History of Present Illness (HPI) ADMISSION 09/05/2020 This is a 51 year old man who has type 2 diabetes. Essentially the problem began admitted to [facility] in December with infection gas gangrene. Cultures ultimately grew staph lugdunensis. 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 March Rummage. Shortly thereafter he developed a nonhealing wound with wound  dehiscence noted on 05/09/2020. He had a wound VAC I think for a period of time after the surgery but it did not help. He has undergone an IandD and skin graft on the right foot on 05/16/2020. He underwent a further IandD on 06/04/2020 not debrided to bone. 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 Culture of the metatarsal grew Enterobacter cloacae I. He has been followed by Dr. [name] of infectious disease. Currently on Levaquin and doxycycline I think at about the 4-week of 6 mark. At the last visit with Dr. March Rummage he was supposed to have use Santyl although I do not think he has it and he has been using I think a Betadine wet-to-dry. He is referred here by Dr. [name] for our review who saw him yesterday. I cannot see that he has had imaging studies of the right foot. No MRI Past medical history includes type 2 diabetes, in 2021 and ulcer of the left foot that ultimately healed with a wound VAC, stage III chronic kidney disease, first toe amputations bilaterally, hypertension, still a 1/2 pack/day smoker. The patient had ABIs on 08/09/2018 which was 1.00 on the right and 0.96 on the left both areas had triphasic waveforms. Not felt to have an arterial issue. 09/12/2020; 1 week follow-up. He has been doing the dressings along with home health. This is a deep punched out area in the incision of his transmit site. We use silver alginate last week to help with drainage and antibacterial properties. He tolerated this well 6/24; the patient was actually at his podiatrist this morning who debrided the wound. We have been using silver alginate. They are going to follow him in 6 weeks. 6/29; patient is using silver alginate. Apparently saw Dr. March Rummage this week although I have not checked his note I will try to do so. He is following up in 6 weeks. This was the original transmetatarsal amputation when I first saw this 3 mid part of his  amputation site was deep down to bone however this seems to have progressively close down which is gratifying to see. He still has a fairly callus uneven surface however spreading this apart its difficult to identify anything that does not look epithelialized. When I first saw this I thought he would require an extensive debridement perhaps back in the OR by Dr. March Rummage however all of this appears to be looking better and at this point I would continue with the silver alginate see if this holds together over the next several weeks. He does not have an arterial issue 7/13; 2-week follow-up. Unfortunately the area did not hold together there was drainage on his dressing. Still copious amounts of callus. It  was difficult to see where this actually was open therefore I elected to go ahead with a vigorous debridement. Still using silver alginate 7/20 I brought him back after extensive callus debridement last week. The only open area was on the medial part of the foot extensive debridement of thick surrounding callus and I was able to do identify the remaining tunneling area. 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 This is the area that we may have been following. This is smaller this week but still surrounded with thick callus He came in today with a large area of callus on the left midfoot. This I had noticed previously. HOWEVER our intake nurse clearly incorrectly documented this as opening. The callus and split there was an odor so all of this had to be removed. 10/29/2020 unfortunately upon evaluation today this patient appears to have significant mount of callus noted at this point. There does not appear to be any signs of active infection systemically which is great although locally he is having some discomfort around the lateral portion of his foot on the right. The left foot is no having a lot of callus I am not certain even has an open  wound if there is anything is extremely tiny. 11/05/2020 upon evaluation today patient's wounds actually appear to be doing a bit better compared to last week since we have uncovered some of these areas I think there are some definite improvements. With that being said there is still quite a bit of tunneling and undermining at many locations that again has me concerned I still think that the MRI is the best thing to do he has that scheduled for Saturday. 11/26/2020 upon evaluation today patient appears to be doing decently well in regard to his foot in general. He did have an MRI I did review this and he had multiple findings on MRI consistent with osteomyelitis across the board in regard pretty much to his remaining metatarsal regions. Nonetheless there was also some evidence of some involvement of the navicular bone as well. Either way I think that this patient actually is in desperate need of get him in for hyperbaric oxygen therapy we discussed that today that will be detailed in the plan. Nonetheless he is also still on the doxycycline which I think is appropriate for the time being as well I did go ahead and place him on that following the results of the MRI based on what we were seeing. I gave him that prescription for 14 days for the time being and we will go from there. 12/03/2020 upon evaluation today patient appears to be doing decently well in regard to his wounds in general in regard to the right foot. With that being said he does have a callus on the left foot in the end there was actually an area open area here that will need to be addressed. This is small and I think we take care of it now hopefully will heal quite readily. Fortunately I do not see any signs of infection worsening I am getting need to refill the antibiotic for him today. He has been taking doxycycline which seems to be doing well. We will also get a go ahead and get him approved for HBO therapy. 12/31/2020 upon evaluation  today patient appears to be doing really roughly the same as compared to last time I saw him. Fortunately there is no evidence of active infection which  is great news systemically he has started hyperbarics he seems to be doing decently well he said a couple times where anxiety kind of got to him but overall I think he is doing awesome. He is no longer on the antibiotic therapy. With that being said I do believe that he is going to need likely some additional antibiotics based on what I am seeing today we can actually obtain a bone culture from the plantar wound that was hiding underneath the callus. He was having pain in this area which is what may be try to clear away the callus to look sure enough there was an actual opening that went deeper into the wound bed region here. 01/21/2021 upon evaluation today patient appears to be doing about the same in regard to his foot in general. He does have some depth to some of the wound areas I really think he would benefit from is trying to see about getting him set up for a gauze VAC. I think that the gauze VAC could be helpful for him and will work much more effectively with what we are seeing as compared to traditional foam wound VAC. The patient is in agreement with giving this a trial. Nonetheless unfortunately think that he overall seems to be doing really well and I think we are headed in the right direction as far as the overall healing is concerned. 01/28/2021 upon evaluation today patient appears to be doing well with regard to his wound all things considered he does have a lot of callus buildup especially on the plantar aspect of the left foot. There is no wound here but a significant amount of callus. That is getting need to be addressed today. Subsequently he also has a lot of callus on the right foot as well as some of the area around the wounds actually being significantly callused. Again I need to clear this away today. 02/04/2021 upon  evaluation today patient actually seems to be doing quite well with regard to his foot ulcer. I do believe that the hyperbaric oxygen therapy has been beneficial for him thus far also think that he is doing well with the wound VAC. In combination I think he has an excellent plan at this time as far as treatment is concerned. I do not see any signs of evidence of infection which is great news as well. 02/11/2021 upon evaluation today patient appears to be doing okay in regard to his foot ulcer though still has deep wounds that do not seem to be doing nearly as good as what I would like to see. I think that the wound VAC is helping but I do believe there still bone in the base of the wound I think we probably need to see about put him back on an antibiotic, do a PCR culture today to see what we find in order to determine whether or not we need to have him on antibiotics which I think we probably do is just a matter which 1. 02/18/2021 upon evaluation today patient appears to be doing okay with regard to his foot ulcer. He has been using the wound VAC although there is a lot of drainage and to be honest I am not certain this is doing as well as we wanted to right now especially considering the fact the soles of gotten so small which in some ways is good but in other ways is still problematic for Sorbact standpoint. I am not even certain we will be able to  reinitiate the VAC even next week. Nonetheless for now he is on the [place] which is definitely something that we need to get going I am also can see about a referral back to infectious disease to see what they have to say in regard to what is going on currently as well. He voiced understanding and is in agreement with that plan. I did explain this is a very touchy go situation which hinges on [place] getting this healed and the infection improved if not he is looking at amputation. Obviously this is a limb salvage Plan at this point. 02/25/2021 upon  evaluation today patient's wound bed actually showed signs of being much drier and much less macerated compared to last week. We had a wound VAC on hold for this week. With that being said based on what I am seeing I think we probably just want to cancel the wound VAC altogether. I do not think it was doing enough for him to warrant the maceration and risks that are associated. He is on the [place] at this point. He also has an infection disease appointment next week. We will see what they have to say at this juncture. Nonetheless currently he is wrapping up his first round of hyperbaric oxygen therapy we could consider repeat although the biggest issue right now is simply that he actually is having surgery on his limbs due to cataract issues that is tomorrow. Following that surgery I am not certain he really needs to be in the chamber for some period of time we need to see what his surgeon says. 03/04/2021 upon evaluation today patient's foot actually does appear to be doing better which is great news. I do not see any signs of infection he has been tolerating the [place] though he is done with that currently I think we probably need to reopen this for a little bit longer to ensure that he is completely treated. He is in agreement with this plan. Subsequently I will send that into the pharmacy for him today. 03/11/2021 upon evaluation today patient's wounds on the foot actually appear to still be doing about the same. I do not see any evidence of significant improvement which is not good although it is also not significantly worse which is good. It overall he is going to start back on hyperbarics this coming Monday. Obviously I think that can be beneficial for him as well and is going to likely help out with getting this hopefully close. 03/18/2021 upon evaluation today patient appears to be doing still somewhat poorly in regard to his foot unfortunately is also having some issues with his ear he is  having to go to ENT today to see what they can do to help out in this regard. Fortunately I do not see any signs of active infection which is great news. No fevers, chills, nausea, vomiting, or diarrhea. 04/01/2020 upon evaluation today patient appears to be doing about the same in regard to his foot. Unfortunately I am just not seeing a whole lot of improvement currently. I think that he probably needs to go back and see the surgeon to see if there is anything they can do to try to help with closure here. Obviously I do not see any signs that the infection is worsening and overall I think that I am still hopeful that the hyperbarics may be helping with the osteomyelitis to some degree but again I am going to repeat an MRI currently his last 1 was in August  and it showed fairly extensive osteomyelitis. I want to see if anything has improved since then and that will give Dr. March Rummage updated information as well when he sees the patient in order to make any adjustments as necessary. Objective Constitutional Well-nourished and well-hydrated in no acute distress. Vitals Time Taken: 12:45 PM, Height: 65 in, Weight: 196 lbs, BMI: 32.6, Temperature: 98.3 F, Pulse: 95 bpm, Respiratory Rate: 16 breaths/min, Blood Pressure: 131/76 mmHg, Capillary Blood Glucose: 145 mg/dl. Respiratory normal breathing without difficulty. Psychiatric this patient is able to make decisions and demonstrates good insight into disease process. Alert and Oriented x 3. pleasant and cooperative. General Notes: Upon inspection patient's wound bed actually showed signs still having 2 sinus tracts that go all the way down to the bone both connect in the base of the wound. This is essentially 1 wound area with 2 sinus tracts draining. Unfortunately I just do not think because of the location of the wound and how this is that he is really showing signs of significant improvement he still has purulent drainage noted and again the hyperbarics  along with the antibiotics we have been doing everything else I think of Things at [place] but again would not really see any improvement that I would like to see. Integumentary (Hair, Skin) Wound #3 status is Open. Original cause of wound was Gradually Appeared. The date acquired was: 10/29/2020. The wound has been in treatment 22 weeks. The wound is located on the Right,Proximal,Lateral Foot. The wound measures 0.5cm length x 0.2cm width x 2.2cm depth; 0.079cm^2 area and 0.173cm^3 volume. There is Fat Layer (Subcutaneous Tissue) exposed. There is no tunneling or undermining noted. There is a medium amount of serosanguineous drainage noted. The wound margin is distinct with the outline attached to the wound base. There is large (67-100%) pink, pale granulation within the wound bed. There is no necrotic tissue within the wound bed. Wound #7 status is Open. Original cause of wound was Gradually Appeared. The date acquired was: 12/31/2020. The wound has been in treatment 13 weeks. The wound is located on the [place]. The wound measures 0.3cm length x 0.3cm width x 2cm depth; 0.071cm^2 area and 0.141cm^3 volume. There is bone and Fat Layer (Subcutaneous Tissue) exposed. There is no tunneling or undermining noted. There is a medium amount of serosanguineous drainage noted. The wound margin is well defined and not attached to the wound base. There is large (67-100%) pink, pale granulation within the wound bed. There is no necrotic tissue within the wound bed. Assessment Active Problems ICD-10 Other chronic osteomyelitis, right ankle and foot Type 2 diabetes mellitus with foot ulcer Non-pressure chronic ulcer of other part of right foot with necrosis of bone Disruption of external operation (surgical) wound, not elsewhere classified, sequela Nicotine dependence, cigarettes, with other nicotine-induced disorders Non-pressure chronic ulcer of other part of left foot with fat layer  exposed Corns and callosities Procedures Wound #3 Pre-procedure diagnosis of Wound #3 is a Diabetic Wound/Ulcer of the Lower Extremity located on the Right,Proximal,Lateral Foot .Severity of Tissue Pre Debridement is: Fat layer exposed. There was a Excisional Skin/Subcutaneous Tissue Debridement with a total area of 0.1 sq cm performed by Worthy Keeler, PA. With the following instrument(s): Curette Material removed includes Callus, Subcutaneous Tissue, and Skin: Dermis after achieving pain control using Other (Benzocaine). No specimens were taken. A time out was conducted at 13:07, prior to the start of the procedure. A Minimum amount of bleeding was controlled with Pressure. The procedure was  tolerated well. Post Debridement Measurements: 0.5cm length x 0.2cm width x 2.2cm depth; 0.173cm^3 volume. Character of Wound/Ulcer Post Debridement is stable. Severity of Tissue Post Debridement is: Fat layer exposed. Post procedure Diagnosis Wound #3: Same as Pre-Procedure Wound #7 Pre-procedure diagnosis of Wound #7 is a Diabetic Wound/Ulcer of the Lower Extremity located on the Right,Lateral,Plantar Foot .Severity of Tissue Pre Debridement is: Fat layer exposed. There was a Excisional Skin/Subcutaneous Tissue Debridement with a total area of 0.09 sq cm performed by Worthy Keeler, PA. With the following instrument(s): Curette Material removed includes Callus, Subcutaneous Tissue, and Skin: Dermis after achieving pain control using Other (Benzocaine). No specimens were taken. A time out was conducted at 13:07, prior to the start of the procedure. A Minimum amount of bleeding was controlled with Pressure. The procedure was tolerated well. Post Debridement Measurements: 0.3cm length x 0.3cm width x 2cm depth; 0.141cm^3 volume. Character of Wound/Ulcer Post Debridement is stable. Severity of Tissue Post Debridement is: Fat layer exposed. Post procedure Diagnosis Wound #7: Same as  Pre-Procedure Plan Follow-up Appointments: Return Appointment in 1 week. - with Glynn Octave Shower/ Hygiene: May shower with protection but do not get wound dressing(s) wet. Edema Control - Lymphedema / SCD / Other: Elevate legs to the level of the heart or above for 30 minutes daily and/or when sitting, a frequency of: Avoid standing for long periods of time. Moisturize legs daily. - to foot with dressing changes Off-Loading: Open toe surgical shoe to: - Wear at all times except driving. 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 Follow Hyperbaric Oxygen Glycemia Protocol Afrin (Oxymetazoline HCL) 0.05% nasal spray - 1 spray in both nostrils daily as needed prior to HBO treatment for difficulty clearing ears Consults ordered were: Podiatry - Referral to Dr. March Rummage regarding non-healing wounds and surgical options. Radiology ordered were: MRI, lower extremity with and without contrast: Right Foot - Repeat MRI of right foot for non-healing wound with history of osteomyelitis. 1. My suggestion currently is going to be that we going to send the patient back to Dr. March Rummage for evaluation from a surgical perspective to see if there is anything conservative  can be done in order to try to help both save the patient's limb as well as get this closed more effectively and quickly. Again I am not certain if there is anything they can going debride away on the bone or shave away to help with primary closure versus having to go the secondary route or even if we need to open up and leave it more open where we can actually do a wound VAC. Either way I do believe something needs to be done in order to get this moving much more effectively and quickly right now we will just add a stalemate. 2. I am also can recommend  we continue for now with the hyperbarics as well as a silver alginate dressings. Also think the compression wrap has been beneficial for him. We will see patient back for reevaluation in 1 week here in the clinic. If anything worsens or changes patient will contact our office for additional recommendations. Electronic Signature(s) Signed: 04/01/2021 1:23:35 PM By: Worthy Keeler PA-C Previous Signature: 04/01/2021 1:22:31 PM Version By: Worthy Keeler PA-C Entered By: Worthy Keeler on 04/01/2021 13:23:34 -------------------------------------------------------------------------------- SuperBill Details Patient Name: Date of Service: [name], [name] 04/01/2021 Medical Record Number: [id number] Patient Account Number: [id number] Date of Birth/Sex: Treating RN: 1970-08-15 (51 y.o. [name] Primary Care Provider: [name] Other Clinician: Referring Provider: Treating Provider/Extender: [name], [name] in Treatment: 29 Diagnosis Coding ICD-10 Codes Code Description [phone] Other chronic osteomyelitis, right ankle and foot E11.621 Type 2 diabetes mellitus with foot ulcer L97.514 Non-pressure chronic ulcer of other part of right foot with necrosis of bone T81.31XS Disruption of external operation (surgical) wound, not elsewhere classified, sequela F17.218 Nicotine dependence, cigarettes, with other  nicotine-induced disorders L97.522 Non-pressure chronic ulcer of other part of left foot with fat layer exposed L84 Corns and callosities Facility Procedures CPT4 Code: [id number] Description: 30940 - DEB SUBQ TISSUE 20 SQ CM/< ICD-10 Diagnosis Description L97.514 Non-pressure chronic ulcer of other part of right foot with necrosis of bone Modifier: Quantity: 1 Physician Procedures : CPT4 Code Description Modifier [id number] 10315 - WC PHYS LEVEL 4 - EST PT 25 ICD-10 Diagnosis Description M86.671 Other chronic osteomyelitis, right ankle and foot E11.621 Type 2 diabetes mellitus with foot ulcer L97.514 Non-pressure chronic ulcer of  other part of right foot with necrosis of bone T81.31XS Disruption of external operation (surgical) wound, not elsewhere classified, sequela Quantity: 1 : [id number] 92446 - WC PHYS SUBQ TISS 20 SQ CM ICD-10 Diagnosis Description L97.514 Non-pressure chronic ulcer of other part of right foot with necrosis of bone Quantity: 1 Electronic Signature(s) Signed: 04/01/2021 4:51:59 PM By: Worthy Keeler PA-C Signed: 04/01/2021 5:15:33 PM By: Lorrin Jackson Previous Signature: 04/01/2021 1:22:53 PM Version By: Worthy Keeler PA-C Entered By: Lorrin Jackson on 04/01/2021 13:23:50

## 2021-04-01 NOTE — Progress Notes (Signed)
Pt presents for diabetes follow-up,

## 2021-04-01 NOTE — Progress Notes (Signed)
OTHA, [name] ([id number]) Visit Report for 04/01/2021 Arrival Information Details Patient Name: Date of Service: [address], [place] 04/01/2021 12:45 PM Medical Record Number: [id number] Patient Account Number: [id number] Date of Birth/Sex: Treating RN: 1970/06/10 (51 y.o. Marcheta [name] Primary Care Aryanna Shaver: [name] Other Clinician: Referring Giovan Pinsky: Treating Jenna Ardoin/Extender: [name], [name] in Treatment: 29 Visit Information History Since Last Visit Added or deleted any medications: No Patient Arrived: Cane Any new allergies or adverse reactions: No Arrival Time: 12:45 Had a fall or experienced change in No Transfer Assistance: None activities of daily living that may affect Patient Identification Verified: Yes risk of falls: Secondary Verification Process Completed: Yes Signs or symptoms of abuse/neglect since last visito No Patient Requires Transmission-Based Precautions: No Hospitalized since last visit: No Patient Has Alerts: No Implantable device outside of the clinic excluding No cellular tissue based products placed in the center since last visit: Has Dressing in Place as Prescribed: Yes Has Compression in Place as Prescribed: Yes Pain Present Now: No Electronic Signature(s) Signed: 04/01/2021 5:15:33 PM By: Lorrin Jackson Entered By: Lorrin Jackson on 04/01/2021 12:45:50 -------------------------------------------------------------------------------- Encounter Discharge Information Details Patient Name: Date of Service: Joseph Hahn RD, [name] 04/01/2021 12:45 PM Medical Record Number: [id number] Patient Account Number: [id number] Date of Birth/Sex: Treating RN: 1970-06-09 (51 y.o. Marcheta [name] Primary Care Antron Seth: [name] Other Clinician: Referring Mozelle Remlinger: Treating Rene Gonsoulin/Extender: [name], [name] in Treatment: 29 Encounter Discharge Information Items Post Procedure Vitals Discharge Condition:  Stable Temperature (F): 98.3 Ambulatory Status: Cane Pulse (bpm): 95 Discharge Destination: Home Respiratory Rate (breaths/min): 16 Transportation: Private Auto Blood Pressure (mmHg): 131/76 Schedule Follow-up Appointment: Yes Clinical Summary of Care: Provided on 04/01/2021 Form Type Recipient Paper Patient Patient Electronic Signature(s) Signed: 04/01/2021 1:41:59 PM By: Lorrin Jackson Entered By: Lorrin Jackson on 04/01/2021 13:41:59 -------------------------------------------------------------------------------- Lower Extremity Assessment Details Patient Name: Date of Service: Joseph RD, [name] 04/01/2021 12:45 PM Medical Record Number: [id number] Patient Account Number: [id number] Date of Birth/Sex: Treating RN: [birth year] (51 y.o. Marcheta [name] Primary Care [name]: [name] Other Clinician: Referring Krishauna Schatzman: Treating [name]/Extender: [name], [name] in Treatment: 29 Edema Assessment Assessed: [Left: No] [Right: Yes] Edema: [Left: Ye] [Right: s] Calf Left: Right: Point of Measurement: From Medial Instep 32 cm Ankle Left: Right: Point of Measurement: From Medial Instep 21 cm Vascular Assessment Pulses: Dorsalis Pedis Palpable: [Right:Yes] Electronic Signature(s) Signed: 04/01/2021 5:15:33 PM By: Lorrin Jackson Entered By: Lorrin Jackson on 04/01/2021 12:54:25 -------------------------------------------------------------------------------- Multi-Disciplinary Care Plan Details Patient Name: Date of Service: Joseph Hahn RD, [name] 04/01/2021 12:45 PM Medical Record Number: [id number] Patient Account Number: [id number] Date of Birth/Sex: Treating RN: [birth year] (51 y.o. Marcheta [name] Primary Care [name]: [name] Other Clinician: Referring [name]: Treating [name] [name]/Extender: [name], [name] in Treatment: 29 Active Inactive Wound/Skin Impairment Nursing Diagnoses: Impaired tissue  integrity Goals: Patient/caregiver will verbalize understanding of skin care regimen Date Initiated: 09/05/2020 Target Resolution Date: 05/06/2021 Goal Status: Active Ulcer/skin breakdown will have a volume reduction of 30% by week 4 Date Initiated: 09/05/2020 Date Inactivated: 10/08/2020 Target Resolution Date: 10/03/2020 Goal Status: Met Interventions: Assess patient/caregiver ability to obtain necessary supplies Assess patient/caregiver ability to perform ulcer/skin care regimen upon admission and as needed Assess ulceration(s) every visit Provide education on ulcer and skin care Treatment Activities: Patient referred to home care : 09/05/2020 Skin care regimen initiated : 09/05/2020 Topical wound management initiated : 09/05/2020 Notes: 04/01/21: Wound care  regimen continues. Electronic Signature(s) Signed: 04/01/2021 5:15:33 PM By: Lorrin Jackson Entered By: Lorrin Jackson on 04/01/2021 12:45:26 -------------------------------------------------------------------------------- Pain Assessment Details Patient Name: Date of Service: Joseph Hahn RD, [name] 04/01/2021 12:45 PM Medical Record Number: [id number] Patient Account Number: [id number] Date of Birth/Sex: Treating RN: 1970-08-10 (51 y.o. Marcheta [name] Primary Care [name]: [name] Other Clinician: Referring [name]: Treating [name]/Extender: [name], [name] in Treatment: 29 Active Problems Location of Pain Severity and Description of Pain Patient Has Paino No Site Locations Pain Management and Medication Current Pain Management: Electronic Signature(s) Signed: 04/01/2021 5:15:33 PM By: Lorrin Jackson Entered By: Lorrin Jackson on 04/01/2021 12:48:24 -------------------------------------------------------------------------------- Patient/Caregiver Education Details Patient Name: Date of Service: Joseph Hahn RD, [name] 1/4/2023andnbsp12:45 PM Medical Record Number: [id number] Patient Account  Number: [id number] Date of Birth/Gender: Treating RN: 04/12/1970 (51 y.o. Marcheta [name] Primary Care Physician: [name] Other Clinician: Referring Physician: Treating Physician/Extender: [name], [name] in Treatment: 29 Education Assessment Education Provided To: Patient Education Topics Provided Venous: Methods: Explain/Verbal, Printed Responses: State content correctly Wound/Skin Impairment: Methods: Explain/Verbal, Printed Responses: State content correctly Electronic Signature(s) Signed: 04/01/2021 5:15:33 PM By: Lorrin Jackson Entered By: Lorrin Jackson on 04/01/2021 12:58:03 -------------------------------------------------------------------------------- Wound Assessment Details Patient Name: Date of Service: Joseph Hahn RD, [name] 04/01/2021 12:45 PM Medical Record Number: [id number] Patient Account Number: [id number] Date of Birth/Sex: Treating RN: 03-29-1971 (51 y.o. Marcheta [name] Primary Care Aqil Goetting: [name] Other Clinician: Referring Kessa Fairbairn: Treating Itzae Miralles/Extender: [name], [name] in Treatment: 29 Wound Status Wound Number: 3 Primary Diabetic Wound/Ulcer of the Lower Extremity Etiology: Wound Location: Right, Proximal, Lateral Foot Wound Open Wounding Event: Gradually Appeared Status: Date Acquired: 10/29/2020 Comorbid Anemia, Coronary Artery Disease, Hypertension, Type II Weeks Of Treatment: 22 History: Diabetes, Osteomyelitis, Neuropathy Clustered Wound: No Wound Measurements Length: (cm) 0.5 Width: (cm) 0.2 Depth: (cm) 2.2 Area: (cm) 0.079 Volume: (cm) 0.173 % Reduction in Area: 37.3% % Reduction in Volume: -96.6% Epithelialization: Small (1-33%) Tunneling: No Undermining: No Wound Description Classification: Grade 3 Wound Margin: Distinct, outline attached Exudate Amount: Medium Exudate Type: Serosanguineous Exudate Color: red, brown Foul Odor After Cleansing: No Slough/Fibrino  No Wound Bed Granulation Amount: Large (67-100%) Exposed Structure Granulation Quality: Pink, Pale Fascia Exposed: No Necrotic Amount: None Present (0%) Fat Layer (Subcutaneous Tissue) Exposed: Yes Tendon Exposed: No Muscle Exposed: No Joint Exposed: No Bone Exposed: No Treatment Notes Wound #3 (Foot) Wound Laterality: Right, Lateral, Proximal Cleanser Soap and Water Discharge Instruction: May shower and wash wound with dial antibacterial soap and water prior to dressing change. Wound Cleanser Discharge Instruction: Cleanse the wound with wound cleanser prior to applying a clean dressing using gauze sponges, not tissue or cotton balls. Peri-Wound Care Topical Primary Dressing KerraCel Ag Gelling Fiber Dressing, 4x5 in (silver alginate) Discharge Instruction: Apply silver alginate to wound bed as instructed Secondary Dressing Woven Gauze Sponge, Non-Sterile 4x4 in Discharge Instruction: Apply over primary dressing as directed. ABD Pad, 5x9 Discharge Instruction: Apply over primary dressing as directed. Secured With Compression Wrap Kerlix Roll 4.5x3.1 (in/yd) Discharge Instruction: Apply Kerlix and Coban compression as directed. Coban Self-Adherent Wrap 4x5 (in/yd) Discharge Instruction: Apply over Kerlix as directed. Compression Stockings Add-Ons Electronic Signature(s) Signed: 04/01/2021 5:15:33 PM By: Lorrin Jackson Entered By: Lorrin Jackson on 04/01/2021 13:08:04 -------------------------------------------------------------------------------- Wound Assessment Details Patient Name: Date of Service: Joseph Hahn RD, [name] 04/01/2021 12:45 PM Medical Record Number: [id number] Patient Account Number: [id number] Date of Birth/Sex: Treating RN: [birth year] (  51 y.o. Marcheta [name] Primary Care Mohd Clemons: [name] Other Clinician: Referring Kayanna Mckillop: Treating Savonna Birchmeier/Extender: [name], [name] in Treatment: 29 Wound Status Wound Number: 7 Primary  Diabetic Wound/Ulcer of the Lower Extremity Etiology: Wound Location: Right, Lateral, Plantar Foot Wound Open Wounding Event: Gradually Appeared Status: Date Acquired: 12/31/2020 Comorbid Anemia, Coronary Artery Disease, Hypertension, Type II Weeks Of Treatment: 13 History: Diabetes, Osteomyelitis, Neuropathy Clustered Wound: No Wound Measurements Length: (cm) 0.3 Width: (cm) 0.3 Depth: (cm) 2 Area: (cm) 0.071 Volume: (cm) 0.141 Wound Description Classification: Grade 3 Wound Margin: Well defined, not attached Exudate Amount: Medium Exudate Type: Serosanguineous Exudate Color: red, brown Foul Odor After Cleansing: Slough/Fibrino % Reduction in Area: 63.8% % Reduction in Volume: 28.1% Epithelialization: Medium (34-66%) Tunneling: No Undermining: No No No Wound Bed Granulation Amount: Large (67-100%) Exposed Structure Granulation Quality: Pink, Pale Fascia Exposed: No Necrotic Amount: None Present (0%) Fat Layer (Subcutaneous Tissue) Exposed: Yes Tendon Exposed: No Muscle Exposed: No Joint Exposed: No Bone Exposed: Yes Treatment Notes Wound #7 (Foot) Wound Laterality: Plantar, Right, Lateral Cleanser Soap and Water Discharge Instruction: May shower and wash wound with dial antibacterial soap and water prior to dressing change. Wound Cleanser Discharge Instruction: Cleanse the wound with wound cleanser prior to applying a clean dressing using gauze sponges, not tissue or cotton balls. Peri-Wound Care Topical Primary Dressing KerraCel Ag Gelling Fiber Dressing, 4x5 in (silver alginate) Discharge Instruction: Apply silver alginate to wound bed as instructed Secondary Dressing Woven Gauze Sponge, Non-Sterile 4x4 in Discharge Instruction: Apply over primary dressing as directed. ABD Pad, 5x9 Discharge Instruction: Apply over primary dressing as directed. Secured With Compression Wrap Kerlix Roll 4.5x3.1 (in/yd) Discharge Instruction: Apply Kerlix and Coban  compression as directed. Coban Self-Adherent Wrap 4x5 (in/yd) Discharge Instruction: Apply over Kerlix as directed. Compression Stockings Add-Ons Electronic Signature(s) Signed: 04/01/2021 5:15:33 PM By: Lorrin Jackson Entered By: Lorrin Jackson on 04/01/2021 12:50:59 -------------------------------------------------------------------------------- Vitals Details Patient Name: Date of Service: Joseph Hahn RD, [name] 04/01/2021 12:45 PM Medical Record Number: [id number] Patient Account Number: [id number] Date of Birth/Sex: Treating RN: 1970-08-10 (51 y.o. Marcheta [name] Primary Care Stefan Karen: [name] Other Clinician: Referring Nicklaus Alviar: Treating Alonzo Owczarzak/Extender: [name], [name] in Treatment: 29 Vital Signs Time Taken: 12:45 Temperature (F): 98.3 Height (in): 65 Pulse (bpm): 95 Weight (lbs): 196 Respiratory Rate (breaths/min): 16 Body Mass Index (BMI): 32.6 Blood Pressure (mmHg): 131/76 Capillary Blood Glucose (mg/dl): 145 Reference Range: 80 - 120 mg / dl Electronic Signature(s) Signed: 04/01/2021 5:15:33 PM By: Lorrin Jackson Entered By: Lorrin Jackson on 04/01/2021 12:46:29

## 2021-04-02 ENCOUNTER — Other Ambulatory Visit: Payer: Self-pay | Admitting: Family

## 2021-04-02 ENCOUNTER — Other Ambulatory Visit: Payer: Self-pay

## 2021-04-02 ENCOUNTER — Encounter (HOSPITAL_BASED_OUTPATIENT_CLINIC_OR_DEPARTMENT_OTHER): Payer: 59 | Admitting: Internal Medicine

## 2021-04-02 DIAGNOSIS — I10 Essential (primary) hypertension: Secondary | ICD-10-CM

## 2021-04-02 DIAGNOSIS — E1142 Type 2 diabetes mellitus with diabetic polyneuropathy: Secondary | ICD-10-CM

## 2021-04-02 DIAGNOSIS — E11621 Type 2 diabetes mellitus with foot ulcer: Secondary | ICD-10-CM | POA: Diagnosis not present

## 2021-04-02 LAB — GLUCOSE, CAPILLARY
Glucose-Capillary: 190 mg/dL — ABNORMAL HIGH (ref 70–99)
Glucose-Capillary: 172 mg/dL — ABNORMAL HIGH (ref 70–99)

## 2021-04-02 LAB — HEMOGLOBIN A1C
Est. average glucose Bld gHb Est-mCnc: 134 mg/dL
Hgb A1c MFr Bld: 6.3 % — ABNORMAL HIGH (ref 4.8–5.6)

## 2021-04-02 MED ORDER — DULAGLUTIDE 3 MG/0.5ML ~~LOC~~ SOAJ: 3.0000 mg | SUBCUTANEOUS | 0 refills | Status: DC

## 2021-04-02 MED ORDER — INSULIN LISPRO (1 UNIT DIAL) 100 UNIT/ML (KWIKPEN): PEN_INJECTOR | SUBCUTANEOUS | 1 refills | Status: DC

## 2021-04-02 MED ORDER — AMLODIPINE BESYLATE 10 MG PO TABS: 10.0000 mg | ORAL_TABLET | Freq: Every day | ORAL | 3 refills | Status: DC

## 2021-04-02 MED ORDER — LANTUS SOLOSTAR 100 UNIT/ML ~~LOC~~ SOPN: PEN_INJECTOR | SUBCUTANEOUS | 1 refills | Status: DC

## 2021-04-02 NOTE — Progress Notes (Addendum)
[name], [name] ([id number]) Visit Report for 04/02/2021 HBO Details Patient Name: Date of Service: [place] RD, [name] 04/02/2021 10:00 A M Medical Record Number: [id number] Patient Account Number: [id number] Date of Birth/Sex: Treating RN: 1970-08-25 (51 y.o. [name], [name] Primary Care Jaylan Hinojosa: [name] Other Clinician: [name] Referring Morrigan Wickens: Treating Lakie Mclouth/Extender: [name], [name] in Treatment: 29 HBO Treatment Course Details Treatment Course Number: 1 Ordering Khaleed Holan: [name] Treatments Ordered: otal 60 HBO Treatment Start Date: 12/08/2020 HBO Indication: Diabetic Ulcer(s) of the Lower Extremity HBO Treatment Details Treatment Number: 47 Patient Type: Outpatient Chamber Type: Monoplace Chamber Serial #: [id number] Treatment Protocol: 2.5 ATA with 90 minutes oxygen, with two 5 minute air breaks Treatment Details Compression Rate Down: 2.0 psi / minute De-Compression Rate Up: 2.0 psi / minute A breaks and breathing ir Compress Tx Pressure periods Decompress Decompress Begins Reached (leave unused spaces Begins Ends blank) Chamber Pressure (ATA 1 2.5 2.5 2.5 2.5 2.5 - - 2.5 1 ) Clock Time (24 hr) 10:08 10:20 10:50 10:55 11:25 11:30 - - 12:00 12:11 Treatment Length: 123 (minutes) Treatment Segments: 4 Vital Signs Capillary Blood Glucose Reference Range: 80 - 120 mg / dl HBO Diabetic Blood Glucose Intervention Range: <131 mg/dl or >249 mg/dl Time Vitals Blood Respiratory Capillary Blood Glucose Pulse Action Type: Pulse: Temperature: Taken: Pressure: Rate: Glucose (mg/dl): Meter #: Oximetry (%) Taken: Pre 09:45 142/84 102 18 97.9 190 Post 12:13 147/96 90 18 98 Treatment Response Treatment Toleration: Well Treatment Completion Status: Treatment Completed without Adverse Event Additional Procedure Documentation Tissue Sevierity: Bone involvement without necrosis Kweli Grassel Notes No concerns with treatment  given Physician HBO Attestation: I certify that I supervised this HBO treatment in accordance with Medicare guidelines. A trained emergency response team is readily available per Yes hospital policies and procedures. Continue HBOT as ordered. Yes Electronic Signature(s) Signed: 04/02/2021 4:38:12 PM By: [name] MD Previous Signature: 04/02/2021 12:56:39 PM Version By: [name] [name] , , Entered By: [name] on 04/02/2021 16:34:54 -------------------------------------------------------------------------------- HBO Safety Checklist Details Patient Name: Date of Service: [name], [place] [name] 04/02/2021 10:00 A M Medical Record Number: [id number] Patient Account Number: [id number] Date of Birth/Sex: Treating RN: 11-Feb-1971 (51 y.o. [name], [name] Primary Care Denilson Salminen: [name] Other Clinician: [name] Referring Tristy Udovich: Treating Kaynan Klonowski/Extender: [name], [name] in Treatment: 29 HBO Safety Checklist Items Safety Checklist Consent Form Signed Patient voided / foley secured and emptied When did you last eato 0800 Last dose of injectable or oral agent 0800 Ostomy pouch emptied and vented if applicable NA All implantable devices assessed, documented and approved NA Intravenous access site secured and place NA Valuables secured Linens and cotton and cotton/polyester blend (less than 51% polyester) Personal oil-based products / skin lotions / body lotions removed Wigs or hairpieces removed NA Smoking or tobacco materials removed NA Books / newspapers / magazines / loose paper removed Cologne, aftershave, perfume and deodorant removed Jewelry removed (may wrap wedding band) Make-up removed NA Hair care products removed NA Battery operated devices (external) removed Heating patches and chemical warmers removed Titanium eyewear removed NA Nail polish cured greater than 10 hours NA Casting material cured greater  than 10 hours NA Hearing aids removed NA Loose dentures or partials removed NA Prosthetics have been removed NA Patient demonstrates correct use of air break device (if applicable) Patient concerns have been addressed Patient grounding bracelet on and cord attached to chamber Specifics for Inpatients (complete in  addition to above) Medication sheet sent with patient NA Intravenous medications needed or due during therapy sent with patient NA Drainage tubes (e.g. nasogastric tube or chest tube secured and vented) NA Endotracheal or Tracheotomy tube secured NA Cuff deflated of air and inflated with saline NA Airway suctioned NA Notes Paper version used prior to treatment. Electronic Signature(s) Signed: 04/02/2021 12:55:01 PM By: [name] [name] , , Previous Signature: 04/02/2021 11:14:53 AM Version By: [name] [name] , , Entered By: [name] on 04/02/2021 12:55:00

## 2021-04-02 NOTE — Progress Notes (Signed)
[name], [name] ([id number]) Visit Report for 04/02/2021 SuperBill Details Patient Name: Date of Service: [address], [name] 04/02/2021 Medical Record Number: [id number] Patient Account Number: [id number] Date of Birth/Sex: Treating RN: [birth year] (51 y.o. [name], [name] Primary Care Provider: [name] Other Clinician: [name] Referring Provider: Treating Provider/Extender: [name], [name] in Treatment: 29 Diagnosis Coding ICD-10 Codes Code Description [phone] Other chronic osteomyelitis, right ankle and foot E11.621 Type 2 diabetes mellitus with foot ulcer L97.514 Non-pressure chronic ulcer of other part of right foot with necrosis of bone T81.31XS Disruption of external operation (surgical) wound, not elsewhere classified, sequela F17.218 Nicotine dependence, cigarettes, with other nicotine-induced disorders L97.522 Non-pressure chronic ulcer of other part of left foot with fat layer exposed L84 Corns and callosities Facility Procedures CPT4 Code Description Modifier Quantity [id number] G0277-(Facility Use Only) HBOT full body chamber, 44min , 4 ICD-10 Diagnosis Description E11.621 Type 2 diabetes mellitus with foot ulcer M86.671 Other chronic osteomyelitis, right ankle and foot L97.514 Non-pressure chronic ulcer of other part of right foot with necrosis of bone Physician Procedures Quantity CPT4 Code Description Modifier [id number] 73567 - WC PHYS HYPERBARIC OXYGEN THERAPY 1 ICD-10 Diagnosis Description E11.621 Type 2 diabetes mellitus with foot ulcer M86.671 Other chronic osteomyelitis, right ankle and foot L97.514 Non-pressure chronic ulcer of other part of right foot with necrosis of bone Electronic Signature(s) Signed: 04/02/2021 12:58:06 PM By: [name] [name] , , Signed: 04/02/2021 4:38:12 PM By: [name] MD Entered By: [name] on 04/02/2021 12:58:02

## 2021-04-02 NOTE — Progress Notes (Signed)
Diabetes remaining at goal. Continue Insulin Glargine, Insulin Lispro, and Trulicity. Follow-up with primary provider in 4 months or sooner if needed.

## 2021-04-02 NOTE — Progress Notes (Addendum)
DRAYK, HUMBARGER ([id number]) Visit Report for 04/02/2021 Arrival Information Details Patient Name: Date of Service: [address], [name] 04/02/2021 10:00 A M Medical Record Number: [id number] Patient Account Number: [id number] Date of Birth/Sex: Treating RN: 07-09-1970 (51 y.o. [name], Meta.Reding Primary Care Militza Devery: [name] Other Clinician: [name] Referring Eliya Geiman: Treating Angeliz Settlemyre/Extender: [name], [name] in Treatment: 29 Visit Information History Since Last Visit All ordered tests and consults were completed: Yes Patient Arrived: Ambulatory Added or deleted any medications: No Arrival Time: 09:39 Any new allergies or adverse reactions: No Accompanied By: self Had a fall or experienced change in No Transfer Assistance: None activities of daily living that may affect Patient Identification Verified: Yes risk of falls: Secondary Verification Process Completed: Yes Signs or symptoms of abuse/neglect since last visito No Patient Requires Transmission-Based Precautions: No Hospitalized since last visit: No Patient Has Alerts: No Implantable device outside of the clinic excluding No cellular tissue based products placed in the center since last visit: Pain Present Now: No Electronic Signature(s) Signed: 04/02/2021 11:12:43 AM By: [name] [name] , , Entered By: [name] on 04/02/2021 11:12:42 -------------------------------------------------------------------------------- Encounter Discharge Information Details Patient Name: Date of Service: [name], [place]. 04/02/2021 10:00 A M Medical Record Number: [id number] Patient Account Number: [id number] Date of Birth/Sex: Treating RN: Jul 14, 1970 (51 y.o. [name] Primary Care Zaylynn Rickett: [name] Other Clinician: [name] Referring Arben Packman: Treating Shantella Blubaugh/Extender: [name], [name] in Treatment: 29 Encounter Discharge  Information Items Discharge Condition: Stable Ambulatory Status: Ambulatory Discharge Destination: Home Transportation: Private Auto Accompanied By: self Schedule Follow-up Appointment: No Clinical Summary of Care: Electronic Signature(s) Signed: 04/02/2021 12:58:40 PM By: [name] [name] , , Entered By: [name] on 04/02/2021 12:58:39 -------------------------------------------------------------------------------- [facility] Details Patient Name: Date of Service: [name], [name] 04/02/2021 10:00 A M Medical Record Number: [id number] Patient Account Number: [id number] Date of Birth/Sex: Treating RN: 1970/06/21 (51 y.o. [name], [name] Primary Care Sherral Dirocco: [name] Other Clinician: [name] Referring Markelle Asaro: Treating Bentleigh Waren/Extender: [name], [name] in Treatment: 29 Vital Signs Time Taken: 09:45 Temperature (F): 97.9 Height (in): 65 Pulse (bpm): 102 Weight (lbs): 196 Respiratory Rate (breaths/min): 18 Body Mass Index (BMI): 32.6 Blood Pressure (mmHg): 142/84 Capillary Blood Glucose (mg/dl): 190 Reference Range: 80 - 120 mg / dl Electronic Signature(s) Signed: 04/02/2021 11:13:33 AM By: [name] [name] , , Entered By: [name] on 04/02/2021 11:13:32

## 2021-04-03 ENCOUNTER — Encounter (HOSPITAL_BASED_OUTPATIENT_CLINIC_OR_DEPARTMENT_OTHER): Payer: 59 | Admitting: Internal Medicine

## 2021-04-03 DIAGNOSIS — M86671 Other chronic osteomyelitis, right ankle and foot: Secondary | ICD-10-CM

## 2021-04-03 DIAGNOSIS — E11621 Type 2 diabetes mellitus with foot ulcer: Secondary | ICD-10-CM | POA: Diagnosis not present

## 2021-04-03 DIAGNOSIS — L97514 Non-pressure chronic ulcer of other part of right foot with necrosis of bone: Secondary | ICD-10-CM | POA: Diagnosis not present

## 2021-04-03 LAB — GLUCOSE, CAPILLARY
Glucose-Capillary: 195 mg/dL — ABNORMAL HIGH (ref 70–99)
Glucose-Capillary: 144 mg/dL — ABNORMAL HIGH (ref 70–99)

## 2021-04-03 NOTE — Progress Notes (Addendum)
KALEE, MCCLENATHAN ([id number]) Visit Report for 04/03/2021 Arrival Information Details Patient Name: Date of Service: [address], [name] 04/03/2021 10:00 A M Medical Record Number: [id number] Patient Account Number: [id number] Date of Birth/Sex: Treating RN: 1970/10/09 (51 y.o. [name], [name] Primary Care Veniamin Kincaid: [name] Other Clinician: [name] Referring Kaela Beitz: Treating Lamonta Cypress/Extender: [name], [name] in Treatment: 21 Visit Information History Since Last Visit All ordered tests and consults were completed: Yes Patient Arrived: Ambulatory Added or deleted any medications: No Arrival Time: 09:39 Any new allergies or adverse reactions: No Accompanied By: self Had a fall or experienced change in No Transfer Assistance: None activities of daily living that may affect Patient Identification Verified: Yes risk of falls: Secondary Verification Process Completed: Yes Signs or symptoms of abuse/neglect since last visito No Patient Requires Transmission-Based Precautions: No Hospitalized since last visit: No Patient Has Alerts: No Implantable device outside of the clinic excluding No cellular tissue based products placed in the center since last visit: Pain Present Now: No Electronic Signature(s) Signed: 04/03/2021 11:51:16 AM By: [name] [name] , , Entered By: [name] on 04/03/2021 11:51:16 -------------------------------------------------------------------------------- Encounter Discharge Information Details Patient Name: Date of Service: [name] RD, [place]. 04/03/2021 10:00 A M Medical Record Number: [id number] Patient Account Number: [id number] Date of Birth/Sex: Treating RN: 1971/01/22 (51 y.o. [name] Primary Care Tamora Huneke: [name] Other Clinician: [name] Referring Shaylon Gillean: Treating Kalim Kissel/Extender: [name], [name] in Treatment: 30 Encounter Discharge  Information Items Discharge Condition: Stable Ambulatory Status: Cane Discharge Destination: Home Transportation: Private Auto Accompanied By: self Schedule Follow-up Appointment: No Clinical Summary of Care: Electronic Signature(s) Signed: 04/03/2021 1:40:47 PM By: [name] [name] , , Entered By: [name] on 04/03/2021 13:40:46 -------------------------------------------------------------------------------- Patient/Caregiver Education Details Patient Name: Date of Service: [name] RD, [name] 1/6/2023andnbsp10:00 A M Medical Record Number: [id number] Patient Account Number: [id number] Date of Birth/Gender: Treating RN: 1971/03/08 (51 y.o. [name] Primary Care Physician: [name] Other Clinician: [name] Referring Physician: Treating Physician/Extender: Katy Fitch in Treatment: 30 Education Assessment Education Provided To: Patient Education Topics Provided Hyperbaric Oxygenation: Handouts: Other: Tympanostomy tubes Methods: Demonstration Notes Patient had concerns about the operation of the chamber prior to receiving tympanostomy tubes for equalization. I showed the patient the operation of the chamber and how it worked and how the chamber was operated on the day he had issues with equalization. I shared with the patient for the dire need to tell staff running the chamber if his ears are not equalizing so that we may stop travel and avoid any trauma to the tympanic membrane. Patient also had concerns about how the equalization tubes would affect his hearing long term. Electronic Signature(s) Signed: 04/03/2021 1:50:22 PM By: [name] [name] , , Entered By: [name] on 04/03/2021 13:50:21 -------------------------------------------------------------------------------- Vitals Details Patient Name: Date of Service: [name] RD, [name] 04/03/2021 10:00 A M Medical Record Number:  [id number] Patient Account Number: [id number] Date of Birth/Sex: Treating RN: [birth year] (51 y.o. [name] Primary Care Robertha Staples: [name] Other Clinician: [name] Referring Colisha Redler: Treating Khamani Fairley/Extender: [name], [name] in Treatment: 30 Vital Signs Time Taken: 09:45 Temperature (F): 98.3 Height (in): 65 Pulse (bpm): 100 Weight (lbs): 196 Respiratory Rate (breaths/min): 16 Body Mass Index (BMI): 32.6 Blood Pressure (mmHg): 122/84 Capillary Blood Glucose (mg/dl): 195 Reference Range: 80 - 120 mg / dl Airway Pulse Oximetry (%):  100 Electronic Signature(s) Signed: 04/07/2021 1:50:55 PM By: [name] [name] , , Entered By: [name] on 04/03/2021 11:52:39

## 2021-04-06 ENCOUNTER — Other Ambulatory Visit: Payer: Self-pay

## 2021-04-06 ENCOUNTER — Encounter (HOSPITAL_BASED_OUTPATIENT_CLINIC_OR_DEPARTMENT_OTHER): Payer: 59 | Admitting: Internal Medicine

## 2021-04-06 DIAGNOSIS — M86671 Other chronic osteomyelitis, right ankle and foot: Secondary | ICD-10-CM

## 2021-04-06 DIAGNOSIS — L97514 Non-pressure chronic ulcer of other part of right foot with necrosis of bone: Secondary | ICD-10-CM

## 2021-04-06 DIAGNOSIS — E11621 Type 2 diabetes mellitus with foot ulcer: Secondary | ICD-10-CM

## 2021-04-06 LAB — GLUCOSE, CAPILLARY
Glucose-Capillary: 168 mg/dL — ABNORMAL HIGH (ref 70–99)
Glucose-Capillary: 132 mg/dL — ABNORMAL HIGH (ref 70–99)

## 2021-04-06 NOTE — Progress Notes (Signed)
BERTON, BUTRICK ([id number]) Visit Report for 04/03/2021 SuperBill Details Patient Name: Date of Service: [address], [name] 04/03/2021 Medical Record Number: [id number] Patient Account Number: [id number] Date of Birth/Sex: Treating RN: 04/05/1970 (51 y.o. [name] Primary Care Provider: [name] Other Clinician: [name] Referring Provider: Treating Provider/Extender: [name], [name] in Treatment: 30 Diagnosis Coding ICD-10 Codes Code Description [phone] Other chronic osteomyelitis, right ankle and foot E11.621 Type 2 diabetes mellitus with foot ulcer L97.514 Non-pressure chronic ulcer of other part of right foot with necrosis of bone T81.31XS Disruption of external operation (surgical) wound, not elsewhere classified, sequela F17.218 Nicotine dependence, cigarettes, with other nicotine-induced disorders L97.522 Non-pressure chronic ulcer of other part of left foot with fat layer exposed L84 Corns and callosities Facility Procedures CPT4 Code Description Modifier Quantity [id number] G0277-(Facility Use Only) HBOT full body chamber, 30min , 4 ICD-10 Diagnosis Description E11.621 Type 2 diabetes mellitus with foot ulcer L97.514 Non-pressure chronic ulcer of other part of right foot with necrosis of bone M86.671 Other chronic osteomyelitis, right ankle and foot Physician Procedures Quantity CPT4 Code Description Modifier [id number] 74259 - WC PHYS HYPERBARIC OXYGEN THERAPY 1 ICD-10 Diagnosis Description E11.621 Type 2 diabetes mellitus with foot ulcer L97.514 Non-pressure chronic ulcer of other part of right foot with necrosis of bone M86.671 Other chronic osteomyelitis, right ankle and foot Electronic Signature(s) Signed: 04/03/2021 1:39:59 PM By: [name] [name] , , Signed: 04/06/2021 10:06:40 AM By: Kalman Shan DO Entered By: [name] on 04/03/2021 13:39:55

## 2021-04-06 NOTE — Progress Notes (Addendum)
ATREYU, MAK ([id number]) Visit Report for 04/06/2021 Arrival Information Details Patient Name: Date of Service: [place] RD, [name] 04/06/2021 10:00 A M Medical Record Number: [id number] Patient Account Number: [id number] Date of Birth/Sex: Treating RN: Jul 28, 1970 (51 y.o. [name] Primary Care Sharise Lippy: [name] Other Clinician: [name] Referring Jamesina Gaugh: Treating Karlen Barbar/Extender: [name], [name] in Treatment: 59 Visit Information History Since Last Visit All ordered tests and consults were completed: Yes Patient Arrived: Ambulatory Added or deleted any medications: No Arrival Time: 09:46 Any new allergies or adverse reactions: No Accompanied By: self Had a fall or experienced change in No Transfer Assistance: None activities of daily living that may affect Patient Identification Verified: Yes risk of falls: Secondary Verification Process Completed: Yes Signs or symptoms of abuse/neglect since last visito No Patient Requires Transmission-Based Precautions: No Hospitalized since last visit: No Patient Has Alerts: No Implantable device outside of the clinic excluding No cellular tissue based products placed in the center since last visit: Pain Present Now: No Electronic Signature(s) Signed: 04/06/2021 11:48:20 AM By: [name] [name] , , Entered By: [name] on 04/06/2021 11:48:20 -------------------------------------------------------------------------------- Encounter Discharge Information Details Patient Name: Date of Service: [name], [place]. 04/06/2021 10:00 A M Medical Record Number: [id number] Patient Account Number: [id number] Date of Birth/Sex: Treating RN: Apr 04, 1970 (51 y.o. [name] Primary Care Eliav Mechling: [name] Other Clinician: [name] Referring Raygen Dahm: Treating Juanitta Earnhardt/Extender: [name], [name] in Treatment: 30 Encounter Discharge  Information Items Discharge Condition: Stable Ambulatory Status: Ambulatory Discharge Destination: Home Transportation: Private Auto Accompanied By: self Schedule Follow-up Appointment: No Clinical Summary of Care: Electronic Signature(s) Signed: 04/07/2021 1:50:55 PM By: [name] [name] , , Entered By: [name] on 04/06/2021 12:56:35 -------------------------------------------------------------------------------- [facility] Details Patient Name: Date of Service: [name], [place]. 04/06/2021 10:00 A M Medical Record Number: [id number] Patient Account Number: [id number] Date of Birth/Sex: Treating RN: Nov 03, 1970 (51 y.o. [name] Primary Care Lashawnda Hancox: [name] Other Clinician: [name] Referring Patric Buckhalter: Treating Ziad Maye/Extender: [name], [name] in Treatment: 30 Vital Signs Time Taken: 09:54 Temperature (F): 97.7 Height (in): 65 Pulse (bpm): 99 Weight (lbs): 196 Respiratory Rate (breaths/min): 16 Body Mass Index (BMI): 32.6 Blood Pressure (mmHg): 122/75 Capillary Blood Glucose (mg/dl): 168 Reference Range: 80 - 120 mg / dl Electronic Signature(s) Signed: 04/06/2021 11:50:23 AM By: [name] [name] , , Entered By: [name] on 04/06/2021 11:50:22

## 2021-04-06 NOTE — Progress Notes (Signed)
PEGGY, MONK ([id number]) Visit Report for 04/06/2021 SuperBill Details Patient Name: Date of Service: [address] RD, [name] 04/06/2021 Medical Record Number: [id number] Patient Account Number: [id number] Date of Birth/Sex: Treating RN: [birth year] (51 y.o. [name] Primary Care Provider: [name] Other Clinician: [name] Referring Provider: Treating Provider/Extender: [name], [name] in Treatment: 30 Diagnosis Coding ICD-10 Codes Code Description [phone] Other chronic osteomyelitis, right ankle and foot E11.621 Type 2 diabetes mellitus with foot ulcer L97.514 Non-pressure chronic ulcer of other part of right foot with necrosis of bone T81.31XS Disruption of external operation (surgical) wound, not elsewhere classified, sequela F17.218 Nicotine dependence, cigarettes, with other nicotine-induced disorders L97.522 Non-pressure chronic ulcer of other part of left foot with fat layer exposed L84 Corns and callosities Facility Procedures CPT4 Code Description Modifier Quantity [id number] G0277-(Facility Use Only) HBOT full body chamber, 39min , 4 ICD-10 Diagnosis Description E11.621 Type 2 diabetes mellitus with foot ulcer L97.514 Non-pressure chronic ulcer of other part of right foot with necrosis of bone M86.671 Other chronic osteomyelitis, right ankle and foot Physician Procedures Quantity CPT4 Code Description Modifier 4383818 40375 - WC PHYS HYPERBARIC OXYGEN THERAPY 1 ICD-10 Diagnosis Description E11.621 Type 2 diabetes mellitus with foot ulcer L97.514 Non-pressure chronic ulcer of other part of right foot with necrosis of bone M86.671 Other chronic osteomyelitis, right ankle and foot Electronic Signature(s) Signed: 04/06/2021 12:56:01 PM By: [name] [name] , , Signed: 04/06/2021 1:27:31 PM By: Kalman Shan DO Entered By: [name] on 04/06/2021 12:55:58

## 2021-04-06 NOTE — Progress Notes (Addendum)
AVIV, LENGACHER ([id number]) Visit Report for 04/06/2021 HBO Details Patient Name: Date of Service: [place] RD, [name] 04/06/2021 10:00 A M Medical Record Number: [id number] Patient Account Number: [id number] Date of Birth/Sex: Treating RN: 09-14-1970 (51 y.o. [name] Primary Care Ranee Peasley: [name] Other Clinician: [name] Referring Daeshon Grammatico: Treating Laniyah Rosenwald/Extender: [name], [name] in Treatment: 30 HBO Treatment Course Details Treatment Course Number: 1 Ordering Jonae Renshaw: [name] Treatments Ordered: otal 60 HBO Treatment Start Date: 12/08/2020 HBO Indication: Diabetic Ulcer(s) of the Lower Extremity HBO Treatment Details Treatment Number: 49 Patient Type: Outpatient Chamber Type: Monoplace Chamber Serial #: [id number] Treatment Protocol: 2.5 ATA with 90 minutes oxygen, with two 5 minute air breaks Treatment Details Compression Rate Down: 2.0 psi / minute De-Compression Rate Up: A breaks and breathing ir Compress Tx Pressure periods Decompress Decompress Begins Reached (leave unused spaces Begins Ends blank) Chamber Pressure (ATA 1 2.5 2.5 2.5 2.5 2.5 - - 2.5 1 ) Clock Time (24 hr) 10:29 10:42 11:12 11:17 11:47 11:52 - - 12:22 12:33 Treatment Length: 124 (minutes) Treatment Segments: 4 Vital Signs Capillary Blood Glucose Reference Range: 80 - 120 mg / dl HBO Diabetic Blood Glucose Intervention Range: <131 mg/dl or >249 mg/dl Time Vitals Blood Respiratory Capillary Blood Glucose Pulse Action Type: Pulse: Temperature: Taken: Pressure: Rate: Glucose (mg/dl): Meter #: Oximetry (%) Taken: Pre 09:54 122/75 99 16 97.7 168 Post 12:37 121/81 89 16 97.6 132 Treatment Response Treatment Toleration: Well Treatment Completion Status: Treatment Completed without Adverse Event Additional Procedure Documentation Tissue Sevierity: Bone involvement without necrosis Physician HBO Attestation: I certify that I supervised this  HBO treatment in accordance with Medicare guidelines. A trained emergency response team is readily available per Yes hospital policies and procedures. Continue HBOT as ordered. Yes Electronic Signature(s) Signed: 04/06/2021 1:27:31 PM By: Kalman Shan DO Previous Signature: 04/06/2021 12:50:31 PM Version By: [name] [name] , , Entered By: Kalman Shan on 04/06/2021 13:27:08 -------------------------------------------------------------------------------- HBO Safety Checklist Details Patient Name: Date of Service: [name], [name] 04/06/2021 10:00 A M Medical Record Number: [id number] Patient Account Number: [id number] Date of Birth/Sex: Treating RN: [birth year] (51 y.o. [name] Primary Care Zacharia Sowles: [name] Other Clinician: [name] Referring Deisi Salonga: Treating Gregg Holster/Extender: [name], [name] in Treatment: 30 HBO Safety Checklist Items Safety Checklist Consent Form Signed Patient voided / foley secured and emptied When did you last eato 0930 Last dose of injectable or oral agent 0930 Ostomy pouch emptied and vented if applicable NA All implantable devices assessed, documented and approved NA Intravenous access site secured and place NA Valuables secured Linens and cotton and cotton/polyester blend (less than 51% polyester) Personal oil-based products / skin lotions / body lotions removed Wigs or hairpieces removed NA Smoking or tobacco materials removed NA Books / newspapers / magazines / loose paper removed Cologne, aftershave, perfume and deodorant removed Jewelry removed (may wrap wedding band) Make-up removed NA Hair care products removed Battery operated devices (external) removed Heating patches and chemical warmers removed Titanium eyewear removed NA Nail polish cured greater than 10 hours NA Casting material cured greater than 10 hours NA Hearing aids removed Loose dentures or partials  removed NA Prosthetics have been removed NA Patient demonstrates correct use of air break device (if applicable) Patient concerns have been addressed Patient grounding bracelet on and cord attached to chamber Specifics for Inpatients (complete in addition to above) Medication sheet sent with patient NA Intravenous medications needed  or due during therapy sent with patient NA Drainage tubes (e.g. nasogastric tube or chest tube secured and vented) NA Endotracheal or Tracheotomy tube secured NA Cuff deflated of air and inflated with saline NA Airway suctioned NA Notes Paper version used prior to treatment. Electronic Signature(s) Signed: 04/06/2021 11:57:15 AM By: [name] [name] , , Entered By: [name] on 04/06/2021 11:57:15

## 2021-04-07 ENCOUNTER — Encounter (HOSPITAL_BASED_OUTPATIENT_CLINIC_OR_DEPARTMENT_OTHER): Payer: 59 | Admitting: Internal Medicine

## 2021-04-07 DIAGNOSIS — E11621 Type 2 diabetes mellitus with foot ulcer: Secondary | ICD-10-CM | POA: Diagnosis not present

## 2021-04-07 LAB — GLUCOSE, CAPILLARY
Glucose-Capillary: 168 mg/dL — ABNORMAL HIGH (ref 70–99)
Glucose-Capillary: 126 mg/dL — ABNORMAL HIGH (ref 70–99)

## 2021-04-07 NOTE — Progress Notes (Addendum)
NERI, SAMEK ([id number]) Visit Report for 04/07/2021 HBO Details Patient Name: Date of Service: [address], [name] 04/07/2021 10:00 A M Medical Record Number: [id number] Patient Account Number: [id number] Date of Birth/Sex: Treating RN: [birth year] (51 y.o. [name] Primary Care Laquandra Carrillo: [name] Other Clinician: [name] Referring Brendaly Townsel: Treating Aline Wesche/Extender: [name], [name] in Treatment: 30 HBO Treatment Course Details Treatment Course Number: 1 Ordering Laney Bagshaw: [name] Treatments Ordered: otal 60 HBO Treatment Start Date: 12/08/2020 HBO Indication: Diabetic Ulcer(s) of the Lower Extremity HBO Treatment Details Treatment Number: 50 Patient Type: Outpatient Chamber Type: Monoplace Chamber Serial #: [id number] Treatment Protocol: 2.5 ATA with 90 minutes oxygen, with two 5 minute air breaks Treatment Details Compression Rate Down: 1.5 psi / minute De-Compression Rate Up: A breaks and breathing ir Compress Tx Pressure periods Decompress Decompress Begins Reached (leave unused spaces Begins Ends blank) Chamber Pressure (ATA 1 2.5 2.5 2.5 2.5 2.5 - - 2.5 1 ) Clock Time (24 hr) 09:54 10:09 10:39 10:44 11:14 11:19 - - 11:49 12:00 Treatment Length: 126 (minutes) Treatment Segments: 4 Vital Signs Capillary Blood Glucose Reference Range: 80 - 120 mg / dl HBO Diabetic Blood Glucose Intervention Range: <131 mg/dl or >249 mg/dl Time Vitals Blood Respiratory Capillary Blood Glucose Pulse Action Type: Pulse: Temperature: Taken: Pressure: Rate: Glucose (mg/dl): Meter #: Oximetry (%) Taken: Pre 09:50 141/92 98 18 97.5 168 Post 12:02 130/82 89 18 98 126 Treatment Response Treatment Toleration: Well Treatment Completion Status: Treatment Completed without Adverse Event Additional Procedure Documentation Tissue Sevierity: Bone involvement without necrosis Tuff [place] Notes No concerns with treatment given Physician  HBO Attestation: I certify that I supervised this HBO treatment in accordance with Medicare guidelines. A trained emergency response team is readily available per Yes hospital policies and procedures. Continue HBOT as ordered. Yes Electronic Signature(s) Signed: 04/07/2021 4:28:08 PM By: [name] MD Previous Signature: 04/07/2021 3:28:16 PM Version By: [name] [name] , , Previous Signature: 04/07/2021 1:35:26 PM Version By: [name] [name] , , Entered By: [name] on 04/07/2021 16:25:13 -------------------------------------------------------------------------------- HBO Safety Checklist Details Patient Name: Date of Service: [name], [name] 04/07/2021 10:00 A M Medical Record Number: [id number] Patient Account Number: [id number] Date of Birth/Sex: Treating RN: [birth year] (51 y.o. [name] Primary Care Amauri Keefe: [name] Other Clinician: [name] Referring Kriste Broman: Treating Skyleen Bentley/Extender: [name], [name] in Treatment: 30 HBO Safety Checklist Items Safety Checklist Consent Form Signed Patient voided / foley secured and emptied When did you last eato 0830 Last dose of injectable or oral agent 0730 Ostomy pouch emptied and vented if applicable NA All implantable devices assessed, documented and approved NA Intravenous access site secured and place NA Valuables secured Linens and cotton and cotton/polyester blend (less than 51% polyester) Personal oil-based products / skin lotions / body lotions removed Wigs or hairpieces removed NA Smoking or tobacco materials removed NA Books / newspapers / magazines / loose paper removed Cologne, aftershave, perfume and deodorant removed Jewelry removed (may wrap wedding band) Make-up removed NA Hair care products removed Battery operated devices (external) removed Heating patches and chemical warmers removed Titanium eyewear removed NA Nail  polish cured greater than 10 hours Casting material cured greater than 10 hours NA Hearing aids removed NA Loose dentures or partials removed NA Prosthetics have been removed NA Patient demonstrates correct use of air break device (if applicable) Patient concerns have been addressed Patient grounding bracelet on and  cord attached to chamber Specifics for Inpatients (complete in addition to above) Medication sheet sent with patient NA Intravenous medications needed or due during therapy sent with patient NA Drainage tubes (e.g. nasogastric tube or chest tube secured and vented) NA Endotracheal or Tracheotomy tube secured NA Cuff deflated of air and inflated with saline NA Airway suctioned NA Notes Paper version used prior to treatment. Electronic Signature(s) Signed: 04/07/2021 3:28:02 PM By: [name] [name] , , Previous Signature: 04/07/2021 11:49:14 AM Version By: [name] [name] , , Entered By: [name] on 04/07/2021 15:28:02

## 2021-04-07 NOTE — Progress Notes (Addendum)
[name], [name] ([id number]) Visit Report for 04/07/2021 Arrival Information [name] Patient Name: Date of Service: [name] RD, [name] Joseph J. 04/07/2021 10:00 A M Medical Record Number: [id number] Patient Account Number: [id number] Date of Birth/Sex: Treating RN: [birth year] (51 y.o. [name] Primary Care [name]: [name] Other Clinician: Donavan [name] Referring [name]: Treating [name]/Extender: [name], [name] in Treatment: 64 Visit Information History Since Last Visit All ordered tests and consults were completed: Yes Patient Arrived: Ambulatory Added or deleted any medications: No Arrival Time: 09:38 Any new allergies or adverse reactions: No Accompanied By: self Had a fall or experienced change in No Transfer Assistance: None activities of daily living that may affect Patient Identification Verified: Yes risk of falls: Secondary Verification Process Completed: Yes Signs or symptoms of abuse/neglect since last visito No Patient Requires Transmission-Based Precautions: No Hospitalized since last visit: No Patient Has Alerts: No Implantable device outside of the clinic excluding No cellular tissue based products placed in the center since last visit: Pain Present Now: No Electronic Signature(s) Signed: 04/07/2021 3:27:40 PM By: [name] [name] , , Previous Signature: 04/07/2021 11:22:45 AM Version By: [name] [name] , , Entered By: [name] on 04/07/2021 15:27:40 -------------------------------------------------------------------------------- Encounter Discharge Information [name] Patient Name: Date of Service: Joseph Hahn RD, [place] Joseph J. 04/07/2021 10:00 A M Medical Record Number: [id number] Patient Account Number: [id number] Date of Birth/Sex: Treating RN: 1970-06-02 (51 y.o. [name] Primary Care [name]: [name] Other Clinician: Donavan [name] Referring [name]: Treating  [name]/Extender: [name], [name] in Treatment: 30 Encounter Discharge Information Items Discharge Condition: Stable Ambulatory Status: Ambulatory Discharge Destination: Home Transportation: Private Auto Accompanied By: self Schedule Follow-up Appointment: No Clinical Summary of Care: Electronic Signature(s) Signed: 04/07/2021 3:29:17 PM By: [name] [name] , , Previous Signature: 04/07/2021 1:37:03 PM Version By: [name] [name] , , Entered By: [name] on 04/07/2021 15:29:17 -------------------------------------------------------------------------------- [name] Patient Name: Date of Service: Joseph Hahn RD, [place] Joseph J. 04/07/2021 10:00 A M Medical Record Number: [id number] Patient Account Number: [id number] Date of Birth/Sex: Treating RN: 08-Oct-1970 (51 y.o. [name] Primary Care [name]: [name] Other Clinician: Donavan [name] Referring [name]: Treating [name]/Extender: [name], [name] in Treatment: 30 Vital Signs Time Taken: 09:50 Temperature (F): 97.5 Height (in): 65 Pulse (bpm): 98 Weight (lbs): 196 Respiratory Rate (breaths/min): 18 Body Mass Index (BMI): 32.6 Blood Pressure (mmHg): 141/92 Capillary Blood Glucose (mg/dl): 168 Reference Range: 80 - 120 mg / dl Electronic Signature(s) Signed: 04/07/2021 3:27:52 PM By: [name] [name] , , Previous Signature: 04/07/2021 1:50:55 PM Version By: [name] [name] , , Entered By: [name] on 04/07/2021 15:27:52

## 2021-04-07 NOTE — Progress Notes (Signed)
MARON, STANZIONE ([id number]) Visit Report for 04/07/2021 SuperBill Details Patient Name: Date of Service: [address], [place] 04/07/2021 Medical Record Number: [id number] Patient Account Number: [id number] Date of Birth/Sex: Treating RN: January 24, 1971 (51 y.o. [name] Primary Care Provider: [name] Other Clinician: [name] Referring Provider: Treating Provider/Extender: [name], [name] in Treatment: 30 Diagnosis Coding ICD-10 Codes Code Description [phone] Other chronic osteomyelitis, right ankle and foot E11.621 Type 2 diabetes mellitus with foot ulcer L97.514 Non-pressure chronic ulcer of other part of right foot with necrosis of bone T81.31XS Disruption of external operation (surgical) wound, not elsewhere classified, sequela F17.218 Nicotine dependence, cigarettes, with other nicotine-induced disorders L97.522 Non-pressure chronic ulcer of other part of left foot with fat layer exposed L84 Corns and callosities Facility Procedures CPT4 Code Description Modifier Quantity [id number] G0277-(Facility Use Only) HBOT full body chamber, 65min , 4 ICD-10 Diagnosis Description E11.621 Type 2 diabetes mellitus with foot ulcer L97.514 Non-pressure chronic ulcer of other part of right foot with necrosis of bone M86.671 Other chronic osteomyelitis, right ankle and foot Physician Procedures Quantity CPT4 Code Description Modifier [id number] 52841 - WC PHYS HYPERBARIC OXYGEN THERAPY 1 ICD-10 Diagnosis Description E11.621 Type 2 diabetes mellitus with foot ulcer L97.514 Non-pressure chronic ulcer of other part of right foot with necrosis of bone M86.671 Other chronic osteomyelitis, right ankle and foot Electronic Signature(s) Signed: 04/07/2021 3:28:44 PM By: [name] [name] , , Signed: 04/07/2021 4:28:08 PM By: [name] MD Previous Signature: 04/07/2021 1:36:13 PM Version By: [name] [name] , , Entered By:  [name] on 04/07/2021 15:28:42

## 2021-04-07 NOTE — Progress Notes (Signed)
[name], [name] ([id number]) Visit Report for 04/03/2021 HBO Details Patient Name: Date of Service: [address], [name] 04/03/2021 10:00 A M Medical Record Number: [id number] Patient Account Number: [id number] Date of Birth/Sex: Treating RN: Sep 17, 1970 (51 y.o. [name] Primary Care Etha Stambaugh: [name] Other Clinician: Donavan [name] Referring Marlies Ligman: Treating Antonella Upson/Extender: [name], [name] in Treatment: 30 HBO Treatment Course Details Treatment Course Number: 1 Ordering Shayan Bramhall: [name] Treatments Ordered: otal 60 HBO Treatment Start Date: 12/08/2020 HBO Indication: Diabetic Ulcer(s) of the Lower Extremity HBO Treatment Details Treatment Number: 48 Patient Type: Outpatient Chamber Type: Monoplace Chamber Serial #: [id number] Treatment Protocol: 2.5 ATA with 90 minutes oxygen, with two 5 minute air breaks Treatment Details Compression Rate Down: 2.0 psi / minute De-Compression Rate Up: 2.0 psi / minute A breaks and breathing ir Compress Tx Pressure periods Decompress Decompress Begins Reached (leave unused spaces Begins Ends blank) Chamber Pressure (ATA 1 2.5 2.5 2.5 2.5 2.5 - - 2.5 1 ) Clock Time (24 hr) 10:21 10:33 11:03 11:08 11:38 11:43 - - 12:13 12:24 Treatment Length: 123 (minutes) Treatment Segments: 4 Vital Signs Capillary Blood Glucose Reference Range: 80 - 120 mg / dl HBO Diabetic Blood Glucose Intervention Range: <131 mg/dl or >249 mg/dl Time Vitals Blood Respiratory Capillary Blood Glucose Pulse Action Type: Pulse: Temperature: Taken: Pressure: Rate: Glucose (mg/dl): Meter #: Oximetry (%) Taken: Pre 09:45 122/84 100 16 98.3 195 100 Post 12:27 124/91 97 16 97.9 144 Treatment Response Treatment Toleration: Well Treatment Completion Status: Treatment Completed without Adverse Event Additional Procedure Documentation Tissue Sevierity: Bone involvement without necrosis Physician HBO Attestation: I certify  that I supervised this HBO treatment in accordance with Medicare guidelines. A trained emergency response team is readily available per Yes hospital policies and procedures. Continue HBOT as ordered. Yes Electronic Signature(s) Signed: 04/06/2021 10:06:40 AM By: Joseph Shan DO Previous Signature: 04/03/2021 1:39:21 PM Version By: [name] [name] , , Entered By: [name] on 04/06/2021 10:03:17 -------------------------------------------------------------------------------- HBO Safety Checklist Details Patient Name: Date of Service: [name], [name] 04/03/2021 10:00 A M Medical Record Number: [id number] Patient Account Number: [id number] Date of Birth/Sex: Treating RN: 08-25-1970 (51 y.o. [name] Primary Care Sidni Fusco: [name] Other Clinician: Donavan [name] Referring Adryel Wortmann: Treating Odilon Cass/Extender: [name], [name] in Treatment: 30 HBO Safety Checklist Items Safety Checklist Consent Form Signed Patient voided / foley secured and emptied When did you last eato 0800 Last dose of injectable or oral agent 0800 Ostomy pouch emptied and vented if applicable NA All implantable devices assessed, documented and approved NA Intravenous access site secured and place NA Valuables secured Linens and cotton and cotton/polyester blend (less than 51% polyester) Personal oil-based products / skin lotions / body lotions removed Wigs or hairpieces removed NA Smoking or tobacco materials removed NA Books / newspapers / magazines / loose paper removed Cologne, aftershave, perfume and deodorant removed Jewelry removed (may wrap wedding band) Make-up removed NA Hair care products removed Battery operated devices (external) removed Heating patches and chemical warmers removed Titanium eyewear removed NA Nail polish cured greater than 10 hours NA Casting material cured greater than 10 hours NA Hearing aids  removed NA Loose dentures or partials removed NA Prosthetics have been removed NA Patient demonstrates correct use of air break device (if applicable) Patient concerns have been addressed Patient grounding bracelet on and cord attached to chamber Specifics for Inpatients (complete in addition to above) Medication sheet sent  with patient NA Intravenous medications needed or due during therapy sent with patient NA Drainage tubes (e.g. nasogastric tube or chest tube secured and vented) NA Endotracheal or Tracheotomy tube secured NA Cuff deflated of air and inflated with saline NA Airway suctioned NA Notes Paper version used prior to treatment. Electronic Signature(s) Signed: 04/07/2021 1:50:55 PM By: [name] [name] , , Entered By: [name] on 04/03/2021 11:54:09

## 2021-04-08 ENCOUNTER — Encounter (HOSPITAL_BASED_OUTPATIENT_CLINIC_OR_DEPARTMENT_OTHER): Payer: 59 | Admitting: Physician Assistant

## 2021-04-08 ENCOUNTER — Other Ambulatory Visit: Payer: Self-pay

## 2021-04-08 DIAGNOSIS — E11621 Type 2 diabetes mellitus with foot ulcer: Secondary | ICD-10-CM | POA: Diagnosis not present

## 2021-04-08 LAB — GLUCOSE, CAPILLARY
Glucose-Capillary: 223 mg/dL — ABNORMAL HIGH (ref 70–99)
Glucose-Capillary: 110 mg/dL — ABNORMAL HIGH (ref 70–99)

## 2021-04-08 NOTE — Progress Notes (Addendum)
[name], [name] ([id number]) Visit Report for 04/08/2021 Chief Complaint Document Details Patient Name: Date of Service: [place] [address], [name] 04/08/2021 12:30 PM Medical Record Number: [id number] Patient Account Number: [id number] Date of Birth/Sex: Treating RN: 1970/05/30 (51 y.o. [name] Primary Care Provider: [name] Other Clinician: Referring Provider: Treating Provider/Extender: [name], [name] in Treatment: 30 Information Obtained from: Patient Chief Complaint 09/05/2020; patient is here for review of the wound extensively on a right TMA amputation site. He also has a wound on the plantar aspect of the left foot Electronic Signature(s) Signed: 04/08/2021 1:27:32 PM By: Worthy Keeler PA-C Entered By: Worthy Keeler on 04/08/2021 13:27:31 -------------------------------------------------------------------------------- HPI Details Patient Name: Date of Service: [name], [place] [name] 04/08/2021 12:30 PM Medical Record Number: [id number] Patient Account Number: [id number] Date of Birth/Sex: Treating RN: Aug 23, 1970 (51 y.o. [name] Primary Care Provider: [name] Other Clinician: Referring Provider: Treating Provider/Extender: [name], [name] in Treatment: 30 History of Present Illness HPI Description: ADMISSION 09/05/2020 This is a 51 year old man who has type 2 diabetes. Essentially the problem began admitted to [facility] health in December with infection gas gangrene. Cultures ultimately grew staph lugdunensis. 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 March Rummage. Shortly thereafter he developed a nonhealing wound with wound dehiscence noted on 05/09/2020. He had a wound VAC I think for a period of time after the surgery but it did not help. He has undergone an IandD and skin graft on the right foot on 05/16/2020. He underwent a further IandD on 06/04/2020 not debrided to bone. Noted to have  exposed bone with osteomyelitis on 07/29/2020 he underwent an operative debridement with resection of the metatarsal. Culture of the metatarsal grew Enterobacter cloacae I. He has been followed by Dr. [name] of infectious disease. Currently on Levaquin and doxycycline I think at about the 4-week of 6 mark. At the last visit with Dr. March Rummage he was supposed to have use Santyl although I do not think he has it and he has been using I think a Betadine wet-to-dry. He is referred here by Dr. [name] for our review who saw him yesterday. I cannot see that he has had imaging studies of the right foot. No MRI Past medical history includes type 2 diabetes, in 2021 and ulcer of the left foot that ultimately healed with a wound VAC, stage III chronic kidney disease, first toe amputations bilaterally, hypertension, still a 1/2 pack/day smoker. The patient had ABIs on 08/09/2018 which was 1.00 on the right and 0.96 on the left both areas had triphasic waveforms. Not felt to have an arterial issue. 09/12/2020; 1 week follow-up. He has been doing the dressings along with home health. This is a deep punched out area in the incision of his transmit site. We use silver alginate last week to help with drainage and antibacterial properties. He tolerated this well 6/24; the patient was actually at his podiatrist this morning who debrided the wound. We have been using silver alginate. They are going to follow him in 6 weeks. 6/29; patient is using silver alginate. Apparently saw Dr. March Rummage this week although I have not checked his note I will try to do so. He is following up in 6 weeks. This was the original transmetatarsal amputation when I first saw this 3 mid part of his amputation site was deep down to bone however this seems to have progressively close down which is gratifying  to see. He still has a fairly callus uneven surface however spreading this apart its difficult to identify anything that does not look epithelialized. When I  first saw this I thought he would require an extensive debridement perhaps back in the OR by Dr. March Rummage however all of this appears to be looking better and at this point I would continue with the silver alginate see if this holds together over the next several weeks. He does not have an arterial issue 7/13; 2-week follow-up. Unfortunately the area did not hold together there was drainage on his dressing. Still copious amounts of callus. It was difficult to see where this actually was open therefore I elected to go ahead with a vigorous debridement. Still using silver alginate 7/20 I brought him back after extensive callus debridement last week. The only open area was on the medial part of the foot extensive debridement of thick surrounding callus and I was able to do identify the remaining tunneling area. 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 This is the area that we may have been following. This is smaller this week but still surrounded with thick callus He came in today with a large area of callus on the left midfoot. This I had noticed previously. HOWEVER our intake nurse clearly incorrectly documented this as opening. The callus and split there was an odor so all of this had to be removed. 10/29/2020 unfortunately upon evaluation today this patient appears to have significant mount of callus noted at this point. There does not appear to be any signs of active infection systemically which is great although locally he is having some discomfort around the lateral portion of his foot on the right. The left foot is no having a lot of callus I am not certain even has an open wound if there is anything is extremely tiny. 11/05/2020 upon evaluation today patient's wounds actually appear to be doing a bit better compared to last week since we have uncovered some of these areas I think there are some definite improvements. With that being  said there is still quite a bit of tunneling and undermining at many locations that again has me concerned I still think that the MRI is the best thing to do he has that scheduled for Saturday. 11/26/2020 upon evaluation today patient appears to be doing decently well in regard to his foot in general. He did have an MRI I did review this and he had multiple findings on MRI consistent with osteomyelitis across the board in regard pretty much to his remaining metatarsal regions. Nonetheless there was also some evidence of some involvement of the navicular bone as well. Either way I think that this patient actually is in desperate need of get him in for hyperbaric oxygen therapy we discussed that today that will be detailed in the plan. Nonetheless he is also still on the doxycycline which I think is appropriate for the time being as well I did go ahead and place him on that following the results of the MRI based on what we were seeing. I gave him that prescription for 14 days for the time being and we will go from there. 12/03/2020 upon evaluation today patient appears to be doing decently well in regard to his wounds in general in regard to the right foot. With that being said he does have a callus on the left foot in the end there was actually  an area open area here that will need to be addressed. This is small and I think we take care of it now hopefully will heal quite readily. Fortunately I do not see any signs of infection worsening I am getting need to refill the antibiotic for him today. He has been taking doxycycline which seems to be doing well. We will also get a go ahead and get him approved for HBO therapy. 12/31/2020 upon evaluation today patient appears to be doing really roughly the same as compared to last time I saw him. Fortunately there is no evidence of active infection which is great news systemically he has started hyperbarics he seems to be doing decently well he said a couple times  where anxiety kind of got to him but overall I think he is doing awesome. He is no longer on the antibiotic therapy. With that being said I do believe that he is going to need likely some additional antibiotics based on what I am seeing today we can actually obtain a bone culture from the plantar wound that was hiding underneath the callus. He was having pain in this area which is what may be try to clear away the callus to look sure enough there was an actual opening that went deeper into the wound bed region here. 01/21/2021 upon evaluation today patient appears to be doing about the same in regard to his foot in general. He does have some depth to some of the wound areas I really think he would benefit from is trying to see about getting him set up for a gauze VAC. I think that the gauze VAC could be helpful for him and will work much more effectively with what we are seeing as compared to traditional foam wound VAC. The patient is in agreement with giving this a trial. Nonetheless unfortunately think that he overall seems to be doing really well and I think we are headed in the right direction as far as the overall healing is concerned. 01/28/2021 upon evaluation today patient appears to be doing well with regard to his wound all things considered he does have a lot of callus buildup especially on the plantar aspect of the left foot. There is no wound here but a significant amount of callus. That is getting need to be addressed today. Subsequently he also has a lot of callus on the right foot as well as some of the area around the wounds actually being significantly callused. Again I need to clear this away today. 02/04/2021 upon evaluation today patient actually seems to be doing quite well with regard to his foot ulcer. I do believe that the hyperbaric oxygen therapy has been beneficial for him thus far also think that he is doing well with the wound VAC. In combination I think he has an excellent  plan at this time as far as treatment is concerned. I do not see any signs of evidence of infection which is great news as well. 02/11/2021 upon evaluation today patient appears to be doing okay in regard to his foot ulcer though still has deep wounds that do not seem to be doing nearly as good as what I would like to see. I think that the wound VAC is helping but I do believe there still bone in the base of the wound I think we probably need to see about put him back on an antibiotic, do a PCR culture today to see what we find in order to determine whether or  not we need to have him on antibiotics which I think we probably do is just a matter which 1. 02/18/2021 upon evaluation today patient appears to be doing okay with regard to his foot ulcer. He has been using the wound VAC although there is a lot of drainage and to be honest I am not certain this is doing as well as we wanted to right now especially considering the fact the soles of gotten so small which in some ways is good but in other ways is still problematic for Sorbact standpoint. I am not even certain we will be able to reinitiate the VAC even next week. Nonetheless for now he is on the [place] which is definitely something that we need to get going I am also can see about a referral back to infectious disease to see what they have to say in regard to what is going on currently as well. He voiced understanding and is in agreement with that plan. I did explain this is a very touchy go situation which hinges on [place] getting this healed and the infection improved if not he is looking at amputation. Obviously this is a limb salvage Plan at this point. 02/25/2021 upon evaluation today patient's wound bed actually showed signs of being much drier and much less macerated compared to last week. We had a wound VAC on hold for this week. With that being said based on what I am seeing I think we probably just want to cancel the wound VAC altogether. I  do not think it was doing enough for him to warrant the maceration and risks that are associated. He is on the [place] at this point. He also has an infection disease appointment next week. We will see what they have to say at this juncture. Nonetheless currently he is wrapping up his first round of hyperbaric oxygen therapy we could consider repeat although the biggest issue right now is simply that he actually is having surgery on his limbs due to cataract issues that is tomorrow. Following that surgery I am not certain he really needs to be in the chamber for some period of time we need to see what his surgeon says. 03/04/2021 upon evaluation today patient's foot actually does appear to be doing better which is great news. I do not see any signs of infection he has been tolerating the [place] though he is done with that currently I think we probably need to reopen this for a little bit longer to ensure that he is completely treated. He is in agreement with this plan. Subsequently I will send that into the pharmacy for him today. 03/11/2021 upon evaluation today patient's wounds on the foot actually appear to still be doing about the same. I do not see any evidence of significant improvement which is not good although it is also not significantly worse which is good. It overall he is going to start back on hyperbarics this coming Monday. Obviously I think that can be beneficial for him as well and is going to likely help out with getting this hopefully close. 03/18/2021 upon evaluation today patient appears to be doing still somewhat poorly in regard to his foot unfortunately is also having some issues with his ear he is having to go to ENT today to see what they can do to help out in this regard. Fortunately I do not see any signs of active infection which is great news. No fevers, chills, nausea, vomiting, or diarrhea. 04/01/2020 upon evaluation  today patient appears to be doing about the same in regard  to his foot. Unfortunately I am just not seeing a whole lot of improvement currently. I think that he probably needs to go back and see the surgeon to see if there is anything they can do to try to help with closure here. Obviously I do not see any signs that the infection is worsening and overall I think that I am still hopeful that the hyperbarics may be helping with the osteomyelitis to some degree but again I am going to repeat an MRI currently his last 1 was in August and it showed fairly extensive osteomyelitis. I want to see if anything has improved since then and that will give Dr. March Rummage updated information as well when he sees the patient in order to make any adjustments as necessary. 04/08/2020 upon evaluation today patient continues to have significant purulent drainage especially from the lateral aspect although both of these wounds really do connect which is primarily the main issue that I see here. I discussed with him going to see Dr. Amalia Hailey he has an appointment next week following his MRI which is actually perfect. Nonetheless someone to see if Dr. Amalia Hailey thinks there is anything surgically that can be done to help with getting this tooth heal as it stands up a little concerned about the fact that this does not seem to be making as much good progress as I would like to see. Especially considering the hyperbarics and everything else that he has been through up to this point. Initially we did see improvement with the hyperbarics but at this point I feel like at best this is just maintaining. That really is not good enough. Electronic Signature(s) Signed: 04/08/2021 2:00:39 PM By: Worthy Keeler PA-C Entered By: Worthy Keeler on 04/08/2021 14:00:38 -------------------------------------------------------------------------------- Physical Exam Details Patient Name: Date of Service: [name], [name] 04/08/2021 12:30 PM Medical Record Number: [id number] Patient Account Number:  [id number] Date of Birth/Sex: Treating RN: 1970/06/01 (51 y.o. [name] Primary Care Provider: [name] Other Clinician: Referring Provider: Treating Provider/Extender: [name], [name] in Treatment: 78 Constitutional Well-nourished and well-hydrated in no acute distress. Respiratory normal breathing without difficulty. Psychiatric this patient is able to make decisions and demonstrates good insight into disease process. Alert and Oriented x 3. pleasant and cooperative. Notes Upon inspection patient's wound bed actually showed signs of good granulation and epithelization at this point. Fortunately there does not appear to be any evidence of active infection locally nor systemically currently which is great news and overall very pleased with where we stand. Electronic Signature(s) Signed: 04/08/2021 2:00:54 PM By: Worthy Keeler PA-C Entered By: Worthy Keeler on 04/08/2021 14:00:54 -------------------------------------------------------------------------------- Physician Orders Details Patient Name: Date of Service: [name], [place] [name] 04/08/2021 12:30 PM Medical Record Number: [id number] Patient Account Number: [id number] Date of Birth/Sex: Treating RN: [birth year] (51 y.o. [name] Primary Care Provider: [name] Other Clinician: Referring Provider: Treating Provider/Extender: [name], [name] in Treatment: 30 Verbal / Phone Orders: No Diagnosis Coding ICD-10 Coding Code Description [phone] Other chronic osteomyelitis, right ankle and foot E11.621 Type 2 diabetes mellitus with foot ulcer L97.514 Non-pressure chronic ulcer of other part of right foot with necrosis of bone T81.31XS Disruption of external operation (surgical) wound, not elsewhere classified, sequela F17.218 Nicotine dependence, cigarettes, with other nicotine-induced disorders L97.522 Non-pressure chronic ulcer of other part of left foot with fat  layer  exposed L84 Corns and callosities Follow-up Appointments ppointment in 1 week. - with Margarita Grizzle Return A Bathing/ Shower/ Hygiene May shower with protection but do not get wound dressing(s) wet. Edema Control - Lymphedema / SCD / Other Elevate legs to the level of the heart or above for 30 minutes daily and/or when sitting, a frequency of: Avoid standing for long periods of time. Moisturize legs daily. - to foot with dressing changes Off-Loading Open toe surgical shoe to: - Wear at all times except driving. on right foot Other: - may wear shoe with memory foam insert to left foot, cut hole where callous is and insert in shoe to help offload Additional Orders / Instructions Stop/Decrease Smoking Follow Nutritious Diet Non Wound Condition Other Non Wound Condition Orders/Instructions: - cushion bottom of left foot with orthopedic felt or foam Hyperbaric Oxygen Therapy Evaluate for HBO Therapy Indication: - wagner grade 3 diabetic foot ulcer of right foot If appropriate for treatment, begin HBOT per protocol: 2.5 ATA for 90 Minutes with 2 Five (5) Minute A Breaks ir Total Number of Treatments: - additional 20 One treatments per day (delivered Monday through Friday unless otherwise specified in Special Instructions below): Finger stick Blood Glucose Pre- and Post- HBOT Treatment. Follow Hyperbaric Oxygen Glycemia Protocol A frin (Oxymetazoline HCL) 0.05% nasal spray - 1 spray in both nostrils daily as needed prior to HBO treatment for difficulty clearing ears Wound Treatment Wound #3 - Foot Wound Laterality: Right, Lateral, Proximal Cleanser: Soap and Water 1 x Per Week/30 Days Discharge Instructions: May shower and wash wound with dial antibacterial soap and water prior to dressing change. Cleanser: Wound Cleanser 1 x Per Week/30 Days Discharge Instructions: Cleanse the wound with wound cleanser prior to applying a clean dressing using gauze sponges, not tissue or cotton balls. Prim  Dressing: Iodoform packing strip 1/4 (in) 1 x Per Week/30 Days ary Discharge Instructions: Lightly pack as instructed Secondary Dressing: Woven Gauze Sponge, Non-Sterile 4x4 in 1 x Per Week/30 Days Discharge Instructions: Apply over primary dressing as directed. Secondary Dressing: ABD Pad, 5x9 1 x Per Week/30 Days Discharge Instructions: Apply over primary dressing as directed. Compression Wrap: Kerlix Roll 4.5x3.1 (in/yd) 1 x Per Week/30 Days Discharge Instructions: Apply Kerlix and Coban compression as directed. Compression Wrap: Coban Self-Adherent Wrap 4x5 (in/yd) 1 x Per Week/30 Days Discharge Instructions: Apply over Kerlix as directed. Wound #7 - Foot Wound Laterality: Plantar, Right, Lateral Cleanser: Soap and Water 1 x Per Week/30 Days Discharge Instructions: May shower and wash wound with dial antibacterial soap and water prior to dressing change. Cleanser: Wound Cleanser 1 x Per Week/30 Days Discharge Instructions: Cleanse the wound with wound cleanser prior to applying a clean dressing using gauze sponges, not tissue or cotton balls. Prim Dressing: Iodoform packing strip 1/4 (in) 1 x Per Week/30 Days ary Discharge Instructions: Lightly pack as instructed Secondary Dressing: Woven Gauze Sponge, Non-Sterile 4x4 in 1 x Per Week/30 Days Discharge Instructions: Apply over primary dressing as directed. Secondary Dressing: ABD Pad, 5x9 1 x Per Week/30 Days Discharge Instructions: Apply over primary dressing as directed. Compression Wrap: Kerlix Roll 4.5x3.1 (in/yd) 1 x Per Week/30 Days Discharge Instructions: Apply Kerlix and Coban compression as directed. Compression Wrap: Coban Self-Adherent Wrap 4x5 (in/yd) 1 x Per Week/30 Days Discharge Instructions: Apply over Kerlix as directed. Laboratory erobe culture (MICRO) - right lateral foot Bacteria identified in Unspecified specimen by A LOINC Code: 616-0 Convenience Name: Areobic culture-specimen not specified GLYCEMIA  INTERVENTIONS PROTOCOL PRE-HBO GLYCEMIA INTERVENTIONS ACTION  INTERVENTION Obtain pre-HBO capillary blood glucose (ensure 1 physician order is in chart). A. Notify HBO physician and await physician orders. 2 If result is 70 mg/dl or below: B. If the result meets the hospital definition of a critical result, follow hospital policy. A. Give patient an 8 ounce Glucerna Shake, an 8 ounce Ensure, or 8 ounces of a Glucerna/Ensure equivalent dietary supplement*. B. Wait 30 minutes. If result is 71 mg/dl to 130 mg/dl: C. Retest patients capillary blood glucose (CBG). D. If result greater than or equal to 110 mg/dl, proceed with HBO. If result less than 110 mg/dl, notify HBO physician and consider holding HBO. If result is 131 mg/dl to 249 mg/dl: A. Proceed with HBO. A. Notify HBO physician and await physician orders. B. It is recommended to hold HBO and do If result is 250 mg/dl or greater: blood/urine ketone testing. C. If the result meets the hospital definition of a critical result, follow hospital policy. POST-HBO GLYCEMIA INTERVENTIONS ACTION INTERVENTION Obtain post HBO capillary blood glucose (ensure 1 physician order is in chart). A. Notify HBO physician and await physician orders. 2 If result is 70 mg/dl or below: B. If the result meets the hospital definition of a critical result, follow hospital policy. A. Give patient an 8 ounce Glucerna Shake, an 8 ounce Ensure, or 8 ounces of a Glucerna/Ensure equivalent dietary supplement*. B. Wait 15 minutes for symptoms of If result is 71 mg/dl to 100 mg/dl: hypoglycemia (i.e. nervousness, anxiety, sweating, chills, clamminess, irritability, confusion, tachycardia or dizziness). C. If patient asymptomatic, discharge patient. If patient symptomatic, repeat capillary blood glucose (CBG) and notify HBO physician. If result is 101 mg/dl to 249 mg/dl: A. Discharge patient. A. Notify HBO physician and await  physician orders. B. It is recommended to do blood/urine ketone If result is 250 mg/dl or greater: testing. C. If the result meets the hospital definition of a critical result, follow hospital policy. *Juice or candies are NOT equivalent products. If patient refuses the Glucerna or Ensure, please consult the hospital dietitian for an appropriate substitute. Electronic Signature(s) Signed: 04/08/2021 5:05:22 PM By: Worthy Keeler PA-C Signed: 04/08/2021 5:54:22 PM By: Baruch Gouty RN, BSN Entered By: Baruch Gouty on 04/08/2021 13:45:29 -------------------------------------------------------------------------------- Problem List Details Patient Name: Date of Service: [name], [place] [name] 04/08/2021 12:30 PM Medical Record Number: [id number] Patient Account Number: [id number] Date of Birth/Sex: Treating RN: 25-Nov-1970 (51 y.o. [name] Primary Care Provider: Minette Brine, [place] Other Clinician: Referring Provider: Treating Provider/Extender: [name], [name] in Treatment: 30 Active Problems ICD-10 Encounter Code Description Active Date MDM Diagnosis [phone] Other chronic osteomyelitis, right ankle and foot 09/05/2020 No Yes E11.621 Type 2 diabetes mellitus with foot ulcer 09/05/2020 No Yes L97.514 Non-pressure chronic ulcer of other part of right foot with necrosis of bone 09/05/2020 No Yes T81.31XS Disruption of external operation (surgical) wound, not elsewhere classified, 09/05/2020 No Yes sequela F17.218 Nicotine dependence, cigarettes, with other nicotine-induced disorders 11/26/2020 No Yes L97.522 Non-pressure chronic ulcer of other part of left foot with fat layer exposed 10/22/2020 No Yes L84 Corns and callosities 01/28/2021 No Yes Inactive Problems Resolved Problems Electronic Signature(s) Signed: 04/08/2021 1:27:22 PM By: Worthy Keeler PA-C Entered By: Worthy Keeler on 04/08/2021  13:27:21 -------------------------------------------------------------------------------- Progress Note Details Patient Name: Date of Service: [name], [name] 04/08/2021 12:30 PM Medical Record Number: [id number] Patient Account Number: [id number] Date of Birth/Sex: Treating RN: Sep 30, 1970 (51 y.o. [name] Primary Care Provider: [name] Other  Clinician: Referring Provider: Treating Provider/Extender: [name], [name] in Treatment: 30 Subjective Chief Complaint Information obtained from Patient 09/05/2020; patient is here for review of the wound extensively on a right TMA amputation site. He also has a wound on the plantar aspect of the left foot History of Present Illness (HPI) ADMISSION 09/05/2020 This is a 51 year old man who has type 2 diabetes. Essentially the problem began admitted to [facility] health in December with infection gas gangrene. Cultures ultimately grew staph lugdunensis. 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 March Rummage. Shortly thereafter he developed a nonhealing wound with wound dehiscence noted on 05/09/2020. He had a wound VAC I think for a period of time after the surgery but it did not help. He has undergone an IandD and skin graft on the right foot on 05/16/2020. He underwent a further IandD on 06/04/2020 not debrided to bone. 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 Culture of the metatarsal grew Enterobacter cloacae I. He has been followed by Dr. [name] of infectious disease. Currently on Levaquin and doxycycline I think at about the 4-week of 6 mark. At the last visit with Dr. March Rummage he was supposed to have use Santyl although I do not think he has it and he has been using I think a Betadine wet-to-dry. He is referred here by Dr. [name] for our review who saw him yesterday. I cannot see that he has had imaging studies of  the right foot. No MRI Past medical history includes type 2 diabetes, in 2021 and ulcer of the left foot that ultimately healed with a wound VAC, stage III chronic kidney disease, first toe amputations bilaterally, hypertension, still a 1/2 pack/day smoker. The patient had ABIs on 08/09/2018 which was 1.00 on the right and 0.96 on the left both areas had triphasic waveforms. Not felt to have an arterial issue. 09/12/2020; 1 week follow-up. He has been doing the dressings along with home health. This is a deep punched out area in the incision of his transmit site. We use silver alginate last week to help with drainage and antibacterial properties. He tolerated this well 6/24; the patient was actually at his podiatrist this morning who debrided the wound. We have been using silver alginate. They are going to follow him in 6 weeks. 6/29; patient is using silver alginate. Apparently saw Dr. March Rummage this week although I have not checked his note I will try to do so. He is following up in 6 weeks. This was the original transmetatarsal amputation when I first saw this 3 mid part of his amputation site was deep down to bone however this seems to have progressively close down which is gratifying to see. He still has a fairly callus uneven surface however spreading this apart its difficult to identify anything that does not look epithelialized. When I first saw this I thought he would require an extensive debridement perhaps back in the OR by Dr. March Rummage however all of this appears to be looking better and at this point I would continue with the silver alginate see if this holds together over the next several weeks. He does not have an arterial issue 7/13; 2-week follow-up. Unfortunately the area did not hold together there was drainage on his dressing. Still copious amounts of callus. It was difficult to see where this actually was open therefore I elected to go ahead with a vigorous debridement. Still  using silver  alginate 7/20 I brought him back after extensive callus debridement last week. The only open area was on the medial part of the foot extensive debridement of thick surrounding callus and I was able to do identify the remaining tunneling area. 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 This is the area that we may have been following. This is smaller this week but still surrounded with thick callus He came in today with a large area of callus on the left midfoot. This I had noticed previously. HOWEVER our intake nurse clearly incorrectly documented this as opening. The callus and split there was an odor so all of this had to be removed. 10/29/2020 unfortunately upon evaluation today this patient appears to have significant mount of callus noted at this point. There does not appear to be any signs of active infection systemically which is great although locally he is having some discomfort around the lateral portion of his foot on the right. The left foot is no having a lot of callus I am not certain even has an open wound if there is anything is extremely tiny. 11/05/2020 upon evaluation today patient's wounds actually appear to be doing a bit better compared to last week since we have uncovered some of these areas I think there are some definite improvements. With that being said there is still quite a bit of tunneling and undermining at many locations that again has me concerned I still think that the MRI is the best thing to do he has that scheduled for Saturday. 11/26/2020 upon evaluation today patient appears to be doing decently well in regard to his foot in general. He did have an MRI I did review this and he had multiple findings on MRI consistent with osteomyelitis across the board in regard pretty much to his remaining metatarsal regions. Nonetheless there was also some evidence of some involvement of the navicular bone as well.  Either way I think that this patient actually is in desperate need of get him in for hyperbaric oxygen therapy we discussed that today that will be detailed in the plan. Nonetheless he is also still on the doxycycline which I think is appropriate for the time being as well I did go ahead and place him on that following the results of the MRI based on what we were seeing. I gave him that prescription for 14 days for the time being and we will go from there. 12/03/2020 upon evaluation today patient appears to be doing decently well in regard to his wounds in general in regard to the right foot. With that being said he does have a callus on the left foot in the end there was actually an area open area here that will need to be addressed. This is small and I think we take care of it now hopefully will heal quite readily. Fortunately I do not see any signs of infection worsening I am getting need to refill the antibiotic for him today. He has been taking doxycycline which seems to be doing well. We will also get a go ahead and get him approved for HBO therapy. 12/31/2020 upon evaluation today patient appears to be doing really roughly the same as compared to last time I saw him. Fortunately there is no evidence of active infection which is great news systemically he has started hyperbarics he seems to be doing decently well he said a couple times  where anxiety kind of got to him but overall I think he is doing awesome. He is no longer on the antibiotic therapy. With that being said I do believe that he is going to need likely some additional antibiotics based on what I am seeing today we can actually obtain a bone culture from the plantar wound that was hiding underneath the callus. He was having pain in this area which is what may be try to clear away the callus to look sure enough there was an actual opening that went deeper into the wound bed region here. 01/21/2021 upon evaluation today patient appears to  be doing about the same in regard to his foot in general. He does have some depth to some of the wound areas I really think he would benefit from is trying to see about getting him set up for a gauze VAC. I think that the gauze VAC could be helpful for him and will work much more effectively with what we are seeing as compared to traditional foam wound VAC. The patient is in agreement with giving this a trial. Nonetheless unfortunately think that he overall seems to be doing really well and I think we are headed in the right direction as far as the overall healing is concerned. 01/28/2021 upon evaluation today patient appears to be doing well with regard to his wound all things considered he does have a lot of callus buildup especially on the plantar aspect of the left foot. There is no wound here but a significant amount of callus. That is getting need to be addressed today. Subsequently he also has a lot of callus on the right foot as well as some of the area around the wounds actually being significantly callused. Again I need to clear this away today. 02/04/2021 upon evaluation today patient actually seems to be doing quite well with regard to his foot ulcer. I do believe that the hyperbaric oxygen therapy has been beneficial for him thus far also think that he is doing well with the wound VAC. In combination I think he has an excellent plan at this time as far as treatment is concerned. I do not see any signs of evidence of infection which is great news as well. 02/11/2021 upon evaluation today patient appears to be doing okay in regard to his foot ulcer though still has deep wounds that do not seem to be doing nearly as good as what I would like to see. I think that the wound VAC is helping but I do believe there still bone in the base of the wound I think we probably need to see about put him back on an antibiotic, do a PCR culture today to see what we find in order to determine whether or not we  need to have him on antibiotics which I think we probably do is just a matter which 1. 02/18/2021 upon evaluation today patient appears to be doing okay with regard to his foot ulcer. He has been using the wound VAC although there is a lot of drainage and to be honest I am not certain this is doing as well as we wanted to right now especially considering the fact the soles of gotten so small which in some ways is good but in other ways is still problematic for Sorbact standpoint. I am not even certain we will be able to reinitiate the VAC even next week. Nonetheless for now he is on the [place] which is definitely something that  we need to get going I am also can see about a referral back to infectious disease to see what they have to say in regard to what is going on currently as well. He voiced understanding and is in agreement with that plan. I did explain this is a very touchy go situation which hinges on [place] getting this healed and the infection improved if not he is looking at amputation. Obviously this is a limb salvage Plan at this point. 02/25/2021 upon evaluation today patient's wound bed actually showed signs of being much drier and much less macerated compared to last week. We had a wound VAC on hold for this week. With that being said based on what I am seeing I think we probably just want to cancel the wound VAC altogether. I do not think it was doing enough for him to warrant the maceration and risks that are associated. He is on the [place] at this point. He also has an infection disease appointment next week. We will see what they have to say at this juncture. Nonetheless currently he is wrapping up his first round of hyperbaric oxygen therapy we could consider repeat although the biggest issue right now is simply that he actually is having surgery on his limbs due to cataract issues that is tomorrow. Following that surgery I am not certain he really needs to be in the chamber for some  period of time we need to see what his surgeon says. 03/04/2021 upon evaluation today patient's foot actually does appear to be doing better which is great news. I do not see any signs of infection he has been tolerating the [place] though he is done with that currently I think we probably need to reopen this for a little bit longer to ensure that he is completely treated. He is in agreement with this plan. Subsequently I will send that into the pharmacy for him today. 03/11/2021 upon evaluation today patient's wounds on the foot actually appear to still be doing about the same. I do not see any evidence of significant improvement which is not good although it is also not significantly worse which is good. It overall he is going to start back on hyperbarics this coming Monday. Obviously I think that can be beneficial for him as well and is going to likely help out with getting this hopefully close. 03/18/2021 upon evaluation today patient appears to be doing still somewhat poorly in regard to his foot unfortunately is also having some issues with his ear he is having to go to ENT today to see what they can do to help out in this regard. Fortunately I do not see any signs of active infection which is great news. No fevers, chills, nausea, vomiting, or diarrhea. 04/01/2020 upon evaluation today patient appears to be doing about the same in regard to his foot. Unfortunately I am just not seeing a whole lot of improvement currently. I think that he probably needs to go back and see the surgeon to see if there is anything they can do to try to help with closure here. Obviously I do not see any signs that the infection is worsening and overall I think that I am still hopeful that the hyperbarics may be helping with the osteomyelitis to some degree but again I am going to repeat an MRI currently his last 1 was in August and it showed fairly extensive osteomyelitis. I want to see if anything has improved since  then and that will  give Dr. March Rummage updated information as well when he sees the patient in order to make any adjustments as necessary. 04/08/2020 upon evaluation today patient continues to have significant purulent drainage especially from the lateral aspect although both of these wounds really do connect which is primarily the main issue that I see here. I discussed with him going to see Dr. Amalia Hailey he has an appointment next week following his MRI which is actually perfect. Nonetheless someone to see if Dr. Amalia Hailey thinks there is anything surgically that can be done to help with getting this tooth heal as it stands up a little concerned about the fact that this does not seem to be making as much good progress as I would like to see. Especially considering the hyperbarics and everything else that he has been through up to this point. Initially we did see improvement with the hyperbarics but at this point I feel like at best this is just maintaining. That really is not good enough. Objective Constitutional Well-nourished and well-hydrated in no acute distress. Vitals Time Taken: 12:47 PM, Height: 65 in, Source: Stated, Weight: 196 lbs, Source: Stated, BMI: 32.6, Temperature: 98.6 F, Pulse: 92 bpm, Respiratory Rate: 18 breaths/min, Blood Pressure: 146/88 mmHg. Respiratory normal breathing without difficulty. Psychiatric this patient is able to make decisions and demonstrates good insight into disease process. Alert and Oriented x 3. pleasant and cooperative. General Notes: Upon inspection patient's wound bed actually showed signs of good granulation and epithelization at this point. Fortunately there does not appear to be any evidence of active infection locally nor systemically currently which is great news and overall very pleased with where we stand. Integumentary (Hair, Skin) Wound #3 status is Open. Original cause of wound was Gradually Appeared. The date acquired was: 10/29/2020. The wound has  been in treatment 23 weeks. The wound is located on the Right,Proximal,Lateral Foot. The wound measures 0.3cm length x 0.3cm width x 1.3cm depth; 0.071cm^2 area and 0.092cm^3 volume. There is Fat Layer (Subcutaneous Tissue) exposed. There is no tunneling noted, however, there is undermining starting at 12:00 and ending at 12:00 with a maximum distance of 1.7cm. There is a medium amount of purulent drainage noted. The wound margin is well defined and not attached to the wound base. There is large (67-100%) pink, pale granulation within the wound bed. There is no necrotic tissue within the wound bed. Wound #7 status is Open. Original cause of wound was Gradually Appeared. The date acquired was: 12/31/2020. The wound has been in treatment 14 weeks. The wound is located on the [place]. The wound measures 0.3cm length x 0.3cm width x 2.5cm depth; 0.071cm^2 area and 0.177cm^3 volume. There is bone and Fat Layer (Subcutaneous Tissue) exposed. There is no tunneling or undermining noted. There is a medium amount of serosanguineous drainage noted. The wound margin is well defined and not attached to the wound base. There is small (1-33%) red granulation within the wound bed. There is no necrotic tissue within the wound bed. Assessment Active Problems ICD-10 Other chronic osteomyelitis, right ankle and foot Type 2 diabetes mellitus with foot ulcer Non-pressure chronic ulcer of other part of right foot with necrosis of bone Disruption of external operation (surgical) wound, not elsewhere classified, sequela Nicotine dependence, cigarettes, with other nicotine-induced disorders Non-pressure chronic ulcer of other part of left foot with fat layer exposed Corns and callosities Plan Follow-up Appointments: Return Appointment in 1 week. - with Glynn Octave Shower/ Hygiene: May shower with protection but do not get  wound dressing(s) wet. Edema Control - Lymphedema / SCD / Other: Elevate  legs to the level of the heart or above for 30 minutes daily and/or when sitting, a frequency of: Avoid standing for long periods of time. Moisturize legs daily. - to foot with dressing changes Off-Loading: Open toe surgical shoe to: - Wear at all times except driving. 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 Follow Hyperbaric Oxygen Glycemia Protocol Afrin (Oxymetazoline HCL) 0.05% nasal spray - 1 spray in both nostrils daily as needed prior to HBO treatment for difficulty clearing ears Laboratory ordered were: Areobic culture-specimen not specified PCR - right lateral foot WOUND #3: - Foot Wound Laterality: Right, Lateral, Proximal Cleanser: Soap and Water 1 x Per Week/30 Days Discharge Instructions: May shower and wash wound with dial antibacterial soap and water prior to dressing change. Cleanser: Wound Cleanser 1 x Per Week/30 Days Discharge Instructions: Cleanse the wound with wound cleanser prior to applying a clean dressing using gauze sponges, not tissue or cotton balls. Prim Dressing: Iodoform packing strip 1/4 (in) 1 x Per Week/30 Days ary Discharge Instructions: Lightly pack as instructed Secondary Dressing: Woven Gauze Sponge, Non-Sterile  4x4 in 1 x Per Week/30 Days Discharge Instructions: Apply over primary dressing as directed. Secondary Dressing: ABD Pad, 5x9 1 x Per Week/30 Days Discharge Instructions: Apply over primary dressing as directed. Com pression Wrap: Kerlix Roll 4.5x3.1 (in/yd) 1 x Per Week/30 Days Discharge Instructions: Apply Kerlix and Coban compression as directed. Com pression Wrap: Coban Self-Adherent Wrap 4x5 (in/yd) 1 x Per Week/30 Days Discharge Instructions: Apply over Kerlix as directed. WOUND #7: - Foot Wound Laterality: Plantar, Right, Lateral Cleanser: Soap and Water 1 x Per Week/30 Days Discharge Instructions: May shower and wash wound with dial antibacterial soap and water prior to dressing change. Cleanser: Wound Cleanser 1 x Per Week/30 Days Discharge Instructions: Cleanse the wound with wound cleanser prior to applying a clean dressing using gauze sponges, not tissue or cotton balls. Prim Dressing: Iodoform packing strip 1/4 (in) 1 x Per Week/30 Days ary Discharge Instructions: Lightly pack as instructed Secondary Dressing: Woven Gauze Sponge, Non-Sterile 4x4 in 1 x Per Week/30 Days Discharge Instructions: Apply over primary dressing as directed. Secondary Dressing: ABD Pad, 5x9 1 x Per Week/30 Days Discharge Instructions: Apply over primary dressing as directed. Com pression Wrap: Kerlix Roll 4.5x3.1 (in/yd) 1 x Per Week/30 Days Discharge Instructions: Apply Kerlix and Coban compression as directed. Com pression Wrap: Coban Self-Adherent Wrap 4x5 (in/yd) 1 x Per Week/30 Days Discharge Instructions: Apply over Kerlix as directed. 1. Would recommend currently that we going to continue with the wound care measures as before and the patient is in agreement with the plan. This includes the use of the iodoform gauze dressing which I think is probably can to be the best way  to go to continue to wick fluid out. It also will not affect anything from the standpoint of his MRI. 2. I am also can  recommend that we have the patient continue with the Curlex and Coban wrap to help with edema control that does seem to be doing decently well for him. 3. I am also can recommend he continue to elevate the legs much as possible again to help with edema. 4. I am also can recommend that we go ahead and obtain a PCR culture for repeat today and we will see what this shows. We should have that information back along with MRI come next week and then he will be seeing the podiatrist, Dr. Amalia Hailey, towards the end of next week. We will see patient back for reevaluation in 1 week here in the clinic. If anything worsens or changes patient will contact our office for additional recommendations. Electronic Signature(s) Signed: 04/08/2021 2:01:58 PM By: Worthy Keeler PA-C Entered By: Worthy Keeler on 04/08/2021 14:01:58 -------------------------------------------------------------------------------- SuperBill Details Patient Name: Date of Service: [name], [name] 04/08/2021 Medical Record Number: [id number] Patient Account Number: [id number] Date of Birth/Sex: Treating RN: September 01, 1970 (51 y.o. 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 L84 Corns and callosities Facility Procedures CPT4 Code: 01007121 Description: 99214 - WOUND CARE VISIT-LEV 4 EST PT Modifier: Quantity:  1 Physician Procedures : CPT4 Code Description Modifier [id number] 54982 - WC PHYS LEVEL 4 - EST PT ICD-10 Diagnosis Description M86.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

## 2021-04-08 NOTE — Progress Notes (Addendum)
LEDGER, HEINDL ([id number]) Visit Report for 04/08/2021 HBO Details Patient Name: Date of Service: [place] [address], [name] 04/08/2021 10:00 A M Medical Record Number: [id number] Patient Account Number: [id number] Date of Birth/Sex: Treating RN: Feb 04, 1971 (51 y.o. [name], [place] Primary Care Tiarah Shisler: [name] Other Clinician: [name] Referring Constancia Geeting: Treating Kynisha Memon/Extender: [name], [name] in Treatment: 30 HBO Treatment Course Details Treatment Course Number: 1 Ordering Ronnetta Currington: [name] Treatments Ordered: otal 60 HBO Treatment Start Date: 12/08/2020 HBO Indication: Diabetic Ulcer(s) of the Lower Extremity HBO Treatment Details Treatment Number: 51 Patient Type: Outpatient Chamber Type: Monoplace Chamber Serial #: [id number] Treatment Protocol: 2.5 ATA with 90 minutes oxygen, with two 5 minute air breaks Treatment Details Compression Rate Down: 2.0 psi / minute De-Compression Rate Up: 2.0 psi / minute A breaks and breathing ir Compress Tx Pressure periods Decompress Decompress Begins Reached (leave unused spaces Begins Ends blank) Chamber Pressure (ATA 1 2.5 2.5 2.5 2.5 2.5 - - 2.5 1 ) Clock Time (24 hr) 10:10 10:21 10:51 10:56 11:26 11:31 - - 12:01 12:12 Treatment Length: 122 (minutes) Treatment Segments: 4 Vital Signs Capillary Blood Glucose Reference Range: 80 - 120 mg / dl HBO Diabetic Blood Glucose Intervention Range: <131 mg/dl or >249 mg/dl Type: Time Vitals Blood Pulse: Respiratory Temperature: Capillary Blood Glucose Pulse Action Taken: Pressure: Rate: Glucose (mg/dl): Meter #: Oximetry (%) Taken: Pre 09:44 135/82 99 16 98.4 223 Post 10:15 146/89 88 16 98.2 110 > 110 mg/dl, proceed with HBO per protocol Treatment Response Treatment Toleration: Well Treatment Completion Status: Treatment Completed without Adverse Event Additional Procedure Documentation Tissue Sevierity: Bone involvement without  necrosis Electronic Signature(s) Signed: 04/08/2021 12:48:35 PM By: [name] [name] , , Signed: 04/08/2021 5:05:22 PM By: Worthy Keeler PA-C Entered By: [name] on 04/08/2021 12:48:35 -------------------------------------------------------------------------------- HBO Safety Checklist Details Patient Name: Date of Service: [name], [name] 04/08/2021 10:00 A M Medical Record Number: [id number] Patient Account Number: [id number] Date of Birth/Sex: Treating RN: Jan 04, 1971 (51 y.o. [name], [name] Primary Care Sadi Arave: [name] Other Clinician: [name] Referring Myesha Stillion: Treating Madden Garron/Extender: [name], [name] in Treatment: 30 HBO Safety Checklist Items Safety Checklist Consent Form Signed Patient voided / foley secured and emptied When did you last eato 0830 Last dose of injectable or oral agent 0730 Ostomy pouch emptied and vented if applicable NA All implantable devices assessed, documented and approved NA Intravenous access site secured and place NA Valuables secured Linens and cotton and cotton/polyester blend (less than 51% polyester) Personal oil-based products / skin lotions / body lotions removed NA Wigs or hairpieces removed NA Smoking or tobacco materials removed Books / newspapers / magazines / loose paper removed Cologne, aftershave, perfume and deodorant removed Jewelry removed (may wrap wedding band) Make-up removed NA Hair care products removed Battery operated devices (external) removed Heating patches and chemical warmers removed Titanium eyewear removed NA Nail polish cured greater than 10 hours NA Casting material cured greater than 10 hours NA Hearing aids removed NA Loose dentures or partials removed NA Prosthetics have been removed NA Patient demonstrates correct use of air break device (if applicable) Patient concerns have been addressed Patient grounding bracelet on  and cord attached to chamber Specifics for Inpatients (complete in addition to above) Medication sheet sent with patient NA Intravenous medications needed or due during therapy sent with patient NA Drainage tubes (e.g. nasogastric tube or chest tube secured and vented) NA Endotracheal  or Tracheotomy tube secured NA Cuff deflated of air and inflated with saline NA Airway suctioned NA Notes Paper version used prior to treatment. Electronic Signature(s) Signed: 04/08/2021 10:58:21 AM By: [name] [name] , , Entered By: [name] on 04/08/2021 10:58:21

## 2021-04-08 NOTE — Progress Notes (Signed)
LEO, FRAY ([id number]) Visit Report for 04/08/2021 Arrival Information Details Patient Name: Date of Service: [place] RD, [name] 04/08/2021 12:30 PM Medical Record Number: [id number] Patient Account Number: [id number] Date of Birth/Sex: Treating RN: 03/08/1971 (51 y.o. [name] Primary Care Donis Kotowski: [name] Other Clinician: Referring Abdon Petrosky: Treating Montrell Cessna/Extender: [name], [name] in Treatment: 30 Visit Information History Since Last Visit Added or deleted any medications: No Patient Arrived: [name] Any new allergies or adverse reactions: No Arrival Time: 12:47 Had a fall or experienced change in No Accompanied By: self activities of daily living that may affect Transfer Assistance: None risk of falls: Patient Identification Verified: Yes Signs or symptoms of abuse/neglect since last visito No Secondary Verification Process Completed: Yes Hospitalized since last visit: No Patient Requires Transmission-Based Precautions: No Implantable device outside of the clinic excluding No Patient Has Alerts: No cellular tissue based products placed in the center since last visit: Has Dressing in Place as Prescribed: Yes Has Compression in Place as Prescribed: Yes Pain Present Now: No Electronic Signature(s) Signed: 04/08/2021 5:54:22 PM By: Baruch Gouty RN, BSN Entered By: Baruch Gouty on 04/08/2021 12:47:49 -------------------------------------------------------------------------------- Clinic Level of Care Assessment Details Patient Name: Date of Service: [facility] RD, [name] 04/08/2021 12:30 PM Medical Record Number: [id number] Patient Account Number: [id number] Date of Birth/Sex: Treating RN: April 14, 1970 (51 y.o. [name] Primary Care Necha Harries: [name] Other Clinician: Referring Chares Slaymaker: Treating Oyindamola Key/Extender: [name], [name] in Treatment: 30 Clinic Level of Care Assessment Items TOOL 4  Quantity Score []  - 0 Use when only an EandM is performed on FOLLOW-UP visit ASSESSMENTS - Nursing Assessment / Reassessment X- 1 10 Reassessment of Co-morbidities (includes updates in patient status) X- 1 5 Reassessment of Adherence to Treatment Plan ASSESSMENTS - Wound and Skin A ssessment / Reassessment []  - 0 Simple Wound Assessment / Reassessment - one wound X- 2 5 Complex Wound Assessment / Reassessment - multiple wounds []  - 0 Dermatologic / Skin Assessment (not related to wound area) ASSESSMENTS - Focused Assessment X- 1 5 Circumferential Edema Measurements - multi extremities []  - 0 Nutritional Assessment / Counseling / Intervention X- 1 5 Lower Extremity Assessment (monofilament, tuning fork, pulses) []  - 0 Peripheral Arterial Disease Assessment (using hand held doppler) ASSESSMENTS - Ostomy and/or Continence Assessment and Care []  - 0 Incontinence Assessment and Management []  - 0 Ostomy Care Assessment and Management (repouching, etc.) PROCESS - Coordination of Care X - Simple Patient / Family Education for ongoing care 1 15 []  - 0 Complex (extensive) Patient / Family Education for ongoing care X- 1 10 Staff obtains Programmer, systems, Records, T Results / Process Orders est []  - 0 Staff telephones HHA, Nursing Homes / Clarify orders / etc []  - 0 Routine Transfer to another Facility (non-emergent condition) []  - 0 Routine Hospital Admission (non-emergent condition) []  - 0 New Admissions / Biomedical engineer / Ordering NPWT Apligraf, etc. , []  - 0 Emergency Hospital Admission (emergent condition) X- 1 10 Simple Discharge Coordination []  - 0 Complex (extensive) Discharge Coordination PROCESS - Special Needs []  - 0 Pediatric / Minor Patient Management []  - 0 Isolation Patient Management []  - 0 Hearing / Language / Visual special needs []  - 0 Assessment of Community assistance (transportation, D/C planning, etc.) []  - 0 Additional assistance / Altered  mentation []  - 0 Support Surface(s) Assessment (bed, cushion, seat, etc.) INTERVENTIONS - Wound Cleansing / Measurement []  - 0 Simple Wound Cleansing - one wound  X- 2 5 Complex Wound Cleansing - multiple wounds X- 1 5 Wound Imaging (photographs - any number of wounds) []  - 0 Wound Tracing (instead of photographs) []  - 0 Simple Wound Measurement - one wound X- 2 5 Complex Wound Measurement - multiple wounds INTERVENTIONS - Wound Dressings X - Small Wound Dressing one or multiple wounds 2 10 []  - 0 Medium Wound Dressing one or multiple wounds []  - 0 Large Wound Dressing one or multiple wounds []  - 0 Application of Medications - topical []  - 0 Application of Medications - injection INTERVENTIONS - Miscellaneous []  - 0 External ear exam X- 1 5 Specimen Collection (cultures, biopsies, blood, body fluids, etc.) X- 1 5 Specimen(s) / Culture(s) sent or taken to Lab for analysis []  - 0 Patient Transfer (multiple staff / Civil Service fast streamer / Similar devices) []  - 0 Simple Staple / Suture removal (25 or less) []  - 0 Complex Staple / Suture removal (26 or more) []  - 0 Hypo / Hyperglycemic Management (close monitor of Blood Glucose) []  - 0 Ankle / Brachial Index (ABI) - do not check if billed separately X- 1 5 Vital Signs Has the patient been seen at the hospital within the last three years: Yes Total Score: 130 Level Of Care: New/Established - Level 4 Electronic Signature(s) Signed: 04/08/2021 5:54:22 PM By: Baruch Gouty RN, BSN Entered By: Baruch Gouty on 04/08/2021 13:46:59 -------------------------------------------------------------------------------- Encounter Discharge Information Details Patient Name: Date of Service: [name], [name] 04/08/2021 12:30 PM Medical Record Number: [id number] Patient Account Number: [id number] Date of Birth/Sex: Treating RN: 1970/12/31 (51 y.o. [name] Primary Care Tanner Yeley: [name] Other Clinician: Referring  Navarre Diana: Treating Eunice Winecoff/Extender: [name], [name] in Treatment: 30 Encounter Discharge Information Items Discharge Condition: Stable Ambulatory Status: Cane Discharge Destination: Home Transportation: Private Auto Accompanied By: self Schedule Follow-up Appointment: Yes Clinical Summary of Care: Patient Declined Electronic Signature(s) Signed: 04/08/2021 5:54:22 PM By: Baruch Gouty RN, BSN Entered By: Baruch Gouty on 04/08/2021 14:03:43 -------------------------------------------------------------------------------- Lower Extremity Assessment Details Patient Name: Date of Service: [name], [name] 04/08/2021 12:30 PM Medical Record Number: [id number] Patient Account Number: [id number] Date of Birth/Sex: Treating RN: [birth year] (51 y.o. [name] Primary Care Danylle Ouk: [name] Other Clinician: Referring Katlyne Nishida: Treating Raeya Merritts/Extender: [name], [name] in Treatment: 30 Edema Assessment Assessed: [Left: No] [Right: No] Edema: [Left: Ye] [Right: s] Calf Left: Right: Point of Measurement: From Medial Instep 33.5 cm Ankle Left: Right: Point of Measurement: From Medial Instep 22.2 cm Vascular Assessment Pulses: Dorsalis Pedis Palpable: [Right:Yes] Electronic Signature(s) Signed: 04/08/2021 5:54:22 PM By: Baruch Gouty RN, BSN Entered By: Baruch Gouty on 04/08/2021 12:55:06 -------------------------------------------------------------------------------- [place] Details Patient Name: Date of Service: [name], [name] 04/08/2021 12:30 PM Medical Record Number: [id number] Patient Account Number: [id number] Date of Birth/Sex: Treating RN: 22-May-1970 (51 y.o. [name] Primary Care Wrangler Penning: [name] Other Clinician: Referring Koleson Reifsteck: Treating Marnita Poirier/Extender: [name], [name] in Treatment: 30 Multidisciplinary Care Plan reviewed with  physician Active Inactive HBO Nursing Diagnoses: Anxiety related to feelings of confinement associated with the hyperbaric oxygen chamber Anxiety related to knowledge deficit of hyperbaric oxygen therapy and treatment procedures Potential for barotraumas to ears, sinuses, teeth, and lungs or cerebral gas embolism related to changes in atmospheric pressure inside hyperbaric oxygen chamber Potential for oxygen toxicity seizures related to delivery of 100% oxygen at an increased atmospheric pressure Potential for pulmonary oxygen toxicity related to delivery  of 100% oxygen at an increased atmospheric pressure Goals: Barotrauma will be prevented during HBO2 Date Initiated: 03/31/2021 Target Resolution Date: 05/06/2021 Goal Status: Active Patient and/or family will be able to state/discuss factors appropriate to the management of their disease process during treatment Date Initiated: 01/28/2021 Date Inactivated: 02/25/2021 Target Resolution Date: 02/25/2021 Goal Status: Met Patient will tolerate the hyperbaric oxygen therapy treatment Date Initiated: 01/28/2021 Target Resolution Date: 05/06/2021 Goal Status: Active Interventions: Administer the correct therapeutic gas delivery based on the patients needs and limitations, per physician order Assess and provide for patients comfort related to the hyperbaric environment and equalization of middle ear Assess for signs and symptoms related to adverse events, including but not limited to confinement anxiety, pneumothorax, oxygen toxicity and baurotrauma Notes: Wound/Skin Impairment Nursing Diagnoses: Impaired tissue integrity Goals: Patient/caregiver will verbalize understanding of skin care regimen Date Initiated: 09/05/2020 Target Resolution Date: 05/06/2021 Goal Status: Active Ulcer/skin breakdown will have a volume reduction of 30% by week 4 Date Initiated: 09/05/2020 Date Inactivated: 10/08/2020 Target Resolution Date: 10/03/2020 Goal Status:  Met Interventions: Assess patient/caregiver ability to obtain necessary supplies Assess patient/caregiver ability to perform ulcer/skin care regimen upon admission and as needed Assess ulceration(s) every visit Provide education on ulcer and skin care Treatment Activities: Patient referred to home care : 09/05/2020 Skin care regimen initiated : 09/05/2020 Topical wound management initiated : 09/05/2020 Notes: 04/01/21: Wound care regimen continues. Electronic Signature(s) Signed: 04/08/2021 5:54:22 PM By: Baruch Gouty RN, BSN Entered By: Baruch Gouty on 04/08/2021 13:03:22 -------------------------------------------------------------------------------- Pain Assessment Details Patient Name: Date of Service: [name], [place] [name] 04/08/2021 12:30 PM Medical Record Number: [id number] Patient Account Number: [id number] Date of Birth/Sex: Treating RN: [birth year] (51 y.o. [name] Primary Care Hadlea Furuya: [name] Other Clinician: Referring Jatavius Ellenwood: Treating Samanyu Tinnell/Extender: [name], [name] in Treatment: 30 Active Problems Location of Pain Severity and Description of Pain Patient Has Paino No Site Locations Rate the pain. Current Pain Level: 0 Character of Pain Describe the Pain: Tender Pain Management and Medication Current Pain Management: Is the Current Pain Management Adequate: Adequate How does your wound impact your activities of daily livingo Sleep: No Bathing: No Appetite: No Relationship With Others: No Bladder Continence: No Emotions: No Bowel Continence: No Work: No Toileting: No Drive: No Dressing: No Hobbies: No Electronic Signature(s) Signed: 04/08/2021 5:54:22 PM By: Baruch Gouty RN, BSN Signed: 04/08/2021 5:54:22 PM By: Baruch Gouty RN, BSN Entered By: Baruch Gouty on 04/08/2021 12:53:53 -------------------------------------------------------------------------------- Patient/Caregiver Education  Details Patient Name: Date of Service: [name], [name] 1/11/2023andnbsp12:30 PM Medical Record Number: [id number] Patient Account Number: [id number] Date of Birth/Gender: Treating RN: 02-07-1971 (51 y.o. [name] Primary Care Physician: [name] Other Clinician: Referring Physician: Treating Physician/Extender: [name], [name] in Treatment: 30 Education Assessment Education Provided To: Patient Education Topics Provided Hyperbaric Oxygenation: Methods: Explain/Verbal Responses: Reinforcements needed, State content correctly Wound/Skin Impairment: Methods: Explain/Verbal Responses: Reinforcements needed, State content correctly Electronic Signature(s) Signed: 04/08/2021 5:54:22 PM By: Baruch Gouty RN, BSN Entered By: Baruch Gouty on 04/08/2021 13:03:47 -------------------------------------------------------------------------------- Wound Assessment Details Patient Name: Date of Service: [name], [name] 04/08/2021 12:30 PM Medical Record Number: [id number] Patient Account Number: [id number] Date of Birth/Sex: Treating RN: Apr 02, 1970 (51 y.o. [name] Primary Care Azzie Thiem: [name] Other Clinician: Referring Jeric Slagel: Treating Jeryl Umholtz/Extender: [name], [name] in Treatment: 30 Wound Status Wound Number: 3 Primary Diabetic Wound/Ulcer of the Lower Extremity Etiology: Wound Location: Right, Proximal,  Lateral Foot Wound Open Wounding Event: Gradually Appeared Status: Date Acquired: 10/29/2020 Comorbid Anemia, Coronary Artery Disease, Hypertension, Type II Weeks Of Treatment: 23 History: Diabetes, Osteomyelitis, Neuropathy Clustered Wound: No Photos Wound Measurements Length: (cm) 0.3 Width: (cm) 0.3 Depth: (cm) 1.3 Area: (cm) 0.071 Volume: (cm) 0.092 % Reduction in Area: 43.7% % Reduction in Volume: -4.5% Epithelialization: None Tunneling: No Undermining: Yes Starting Position  (o'clock): 12 Ending Position (o'clock): 12 Maximum Distance: (cm) 1.7 Wound Description Classification: Grade 3 Wound Margin: Well defined, not attached Exudate Amount: Medium Exudate Type: Purulent Exudate Color: yellow, brown, green Foul Odor After Cleansing: No Slough/Fibrino No Wound Bed Granulation Amount: Large (67-100%) Exposed Structure Granulation Quality: Pink, Pale Fascia Exposed: No Necrotic Amount: None Present (0%) Fat Layer (Subcutaneous Tissue) Exposed: Yes Tendon Exposed: No Muscle Exposed: No Joint Exposed: No Bone Exposed: No Treatment Notes Wound #3 (Foot) Wound Laterality: Right, Lateral, Proximal Cleanser Soap and Water Discharge Instruction: May shower and wash wound with dial antibacterial soap and water prior to dressing change. Wound Cleanser Discharge Instruction: Cleanse the wound with wound cleanser prior to applying a clean dressing using gauze sponges, not tissue or cotton balls. Peri-Wound Care Topical Primary Dressing Iodoform packing strip 1/4 (in) Discharge Instruction: Lightly pack as instructed Secondary Dressing Woven Gauze Sponge, Non-Sterile 4x4 in Discharge Instruction: Apply over primary dressing as directed. ABD Pad, 5x9 Discharge Instruction: Apply over primary dressing as directed. Secured With Compression Wrap Kerlix Roll 4.5x3.1 (in/yd) Discharge Instruction: Apply Kerlix and Coban compression as directed. Coban Self-Adherent Wrap 4x5 (in/yd) Discharge Instruction: Apply over Kerlix as directed. Compression Stockings Add-Ons Electronic Signature(s) Signed: 04/08/2021 5:54:22 PM By: Baruch Gouty RN, BSN Entered By: Baruch Gouty on 04/08/2021 13:06:39 -------------------------------------------------------------------------------- Wound Assessment Details Patient Name: Date of Service: [name], [place] [name] 04/08/2021 12:30 PM Medical Record Number: [id number] Patient Account Number: [id number] Date of  Birth/Sex: Treating RN: 1970/10/02 (51 y.o. [name] Primary Care Ashika Apuzzo: [name] Other Clinician: Referring Romilda Proby: Treating Brianne Maina/Extender: [name], [name] in Treatment: 30 Wound Status Wound Number: 7 Primary Diabetic Wound/Ulcer of the Lower Extremity Etiology: Wound Location: Right, Lateral, Plantar Foot Wound Open Wounding Event: Gradually Appeared Status: Date Acquired: 12/31/2020 Comorbid Anemia, Coronary Artery Disease, Hypertension, Type II Weeks Of Treatment: 14 History: Diabetes, Osteomyelitis, Neuropathy Clustered Wound: No Photos Wound Measurements Length: (cm) 0.3 Width: (cm) 0.3 Depth: (cm) 2.5 Area: (cm) 0.071 Volume: (cm) 0.177 % Reduction in Area: 63.8% % Reduction in Volume: 9.7% Epithelialization: None Tunneling: No Undermining: No Wound Description Classification: Grade 3 Wound Margin: Well defined, not attached Exudate Amount: Medium Exudate Type: Serosanguineous Exudate Color: red, brown Foul Odor After Cleansing: No Slough/Fibrino No Wound Bed Granulation Amount: Small (1-33%) Exposed Structure Granulation Quality: Red Fascia Exposed: No Necrotic Amount: None Present (0%) Fat Layer (Subcutaneous Tissue) Exposed: Yes Tendon Exposed: No Muscle Exposed: No Joint Exposed: No Bone Exposed: Yes Treatment Notes Wound #7 (Foot) Wound Laterality: Plantar, Right, Lateral Cleanser Soap and Water Discharge Instruction: May shower and wash wound with dial antibacterial soap and water prior to dressing change. Wound Cleanser Discharge Instruction: Cleanse the wound with wound cleanser prior to applying a clean dressing using gauze sponges, not tissue or cotton balls. Peri-Wound Care Topical Primary Dressing Iodoform packing strip 1/4 (in) Discharge Instruction: Lightly pack as instructed Secondary Dressing Woven Gauze Sponge, Non-Sterile 4x4 in Discharge Instruction: Apply over primary dressing as  directed. ABD Pad, 5x9 Discharge Instruction: Apply over primary dressing as directed. Secured With Compression Wrap  Kerlix Roll 4.5x3.1 (in/yd) Discharge Instruction: Apply Kerlix and Coban compression as directed. Coban Self-Adherent Wrap 4x5 (in/yd) Discharge Instruction: Apply over Kerlix as directed. Compression Stockings Add-Ons Electronic Signature(s) Signed: 04/08/2021 5:54:22 PM By: Baruch Gouty RN, BSN Entered By: Baruch Gouty on 04/08/2021 13:08:21 -------------------------------------------------------------------------------- Vitals Details Patient Name: Date of Service: [name], [place] [name] 04/08/2021 12:30 PM Medical Record Number: [id number] Patient Account Number: [id number] Date of Birth/Sex: Treating RN: 12/06/1970 (51 y.o. [name] Primary Care Tameron Lama: [name] Other Clinician: Referring Viktoriya Glaspy: Treating Kajol Crispen/Extender: [name], [name] in Treatment: 30 Vital Signs Time Taken: 12:47 Temperature (F): 98.6 Height (in): 65 Pulse (bpm): 92 Source: Stated Respiratory Rate (breaths/min): 18 Weight (lbs): 196 Blood Pressure (mmHg): 146/88 Source: Stated Reference Range: 80 - 120 mg / dl Body Mass Index (BMI): 32.6 Electronic Signature(s) Signed: 04/08/2021 5:54:22 PM By: Baruch Gouty RN, BSN Entered By: Baruch Gouty on 04/08/2021 12:48:21

## 2021-04-08 NOTE — Progress Notes (Signed)
ANDRAE, CLAUNCH ([id number]) Visit Report for 04/08/2021 SuperBill Details Patient Name: Date of Service: [address], [name] 04/08/2021 Medical Record Number: [id number] Patient Account Number: [id number] Date of Birth/Sex: Treating RN: [birth year] (51 y.o. [name], [name] Primary Care Provider: [name] Other Clinician: [name] Referring Provider: Treating Provider/Extender: [name], [name] in Treatment: 30 Diagnosis Coding ICD-10 Codes Code Description [phone] Other chronic osteomyelitis, right ankle and foot E11.621 Type 2 diabetes mellitus with foot ulcer L97.514 Non-pressure chronic ulcer of other part of right foot with necrosis of bone T81.31XS Disruption of external operation (surgical) wound, not elsewhere classified, sequela F17.218 Nicotine dependence, cigarettes, with other nicotine-induced disorders L97.522 Non-pressure chronic ulcer of other part of left foot with fat layer exposed L84 Corns and callosities Facility Procedures CPT4 Code Description Modifier Quantity [id number] G0277-(Facility Use Only) HBOT full body chamber, 67min , 4 ICD-10 Diagnosis Description E11.621 Type 2 diabetes mellitus with foot ulcer L97.514 Non-pressure chronic ulcer of other part of right foot with necrosis of bone M86.671 Other chronic osteomyelitis, right ankle and foot Physician Procedures Quantity CPT4 Code Description Modifier [id number] 84536 - WC PHYS HYPERBARIC OXYGEN THERAPY 1 ICD-10 Diagnosis Description E11.621 Type 2 diabetes mellitus with foot ulcer L97.514 Non-pressure chronic ulcer of other part of right foot with necrosis of bone M86.671 Other chronic osteomyelitis, right ankle and foot Electronic Signature(s) Signed: 04/08/2021 12:49:03 PM By: [name] [name] , , Signed: 04/08/2021 5:05:22 PM By: Worthy Keeler PA-C Entered By: [name] on 04/08/2021 12:49:01

## 2021-04-08 NOTE — Progress Notes (Addendum)
KENTON, FORTIN ([id number]) Visit Report for 04/08/2021 Arrival Information Details Patient Name: Date of Service: [address], [name] 04/08/2021 10:00 A M Medical Record Number: [id number] Patient Account Number: [id number] Date of Birth/Sex: Treating RN: Sep 02, 1970 (51 y.o. [name], [name] Primary Care Royalti Schauf: [name] Other Clinician: Valeria Batman Referring Rachella Basden: Treating Jaonna Word/Extender: [name], [name] in Treatment: 30 Visit Information History Since Last Visit All ordered tests and consults were completed: Yes Patient Arrived: Cane Added or deleted any medications: No Arrival Time: 10:43 Any new allergies or adverse reactions: No Accompanied By: self Had a fall or experienced change in No Transfer Assistance: None activities of daily living that may affect Patient Identification Verified: Yes risk of falls: Secondary Verification Process Completed: Yes Signs or symptoms of abuse/neglect since last visito No Patient Requires Transmission-Based Precautions: No Hospitalized since last visit: No Patient Has Alerts: No Implantable device outside of the clinic excluding No cellular tissue based products placed in the center since last visit: Pain Present Now: No Electronic Signature(s) Signed: 04/10/2021 8:34:43 AM By: [name] [name] , , Previous Signature: 04/08/2021 10:44:23 AM Version By: [name] [name] , , Entered By: [name] on 04/09/2021 11:08:26 -------------------------------------------------------------------------------- Encounter Discharge Information Details Patient Name: Date of Service: [name], [place]. 04/08/2021 10:00 A M Medical Record Number: [id number] Patient Account Number: [id number] Date of Birth/Sex: Treating RN: 01-Jun-1970 (51 y.o. [name], [name] Primary Care Jonathan Kirkendoll: [name] Other Clinician: [name] Referring Mariusz Jubb: Treating  Minnie Shi/Extender: [name], [name] in Treatment: 30 Encounter Discharge Information Items Discharge Condition: Stable Ambulatory Status: Cane Discharge Destination: Home Transportation: Private Auto Accompanied By: self Schedule Follow-up Appointment: No Clinical Summary of Care: Electronic Signature(s) Signed: 04/10/2021 8:34:43 AM By: [name] [name] , , Previous Signature: 04/08/2021 12:49:39 PM Version By: [name] [name] , , Entered By: [name] on 04/09/2021 11:09:18 -------------------------------------------------------------------------------- Vitals Details Patient Name: Date of Service: [name], [name] 04/08/2021 10:00 A M Medical Record Number: [id number] Patient Account Number: [id number] Date of Birth/Sex: Treating RN: 01-08-1971 (51 y.o. [name], [name] Primary Care Alayziah Tangeman: [name] Other Clinician: Valeria Batman Referring Velecia Ovitt: Treating Shafin Pollio/Extender: [name], [name] in Treatment: 30 Vital Signs Time Taken: 09:44 Temperature (F): 98.4 Height (in): 65 Pulse (bpm): 99 Weight (lbs): 196 Respiratory Rate (breaths/min): 16 Body Mass Index (BMI): 32.6 Blood Pressure (mmHg): 135/82 Capillary Blood Glucose (mg/dl): 223 Reference Range: 80 - 120 mg / dl Electronic Signature(s) Signed: 04/08/2021 10:45:09 AM By: [name] [name] , , Entered By: [name] on 04/08/2021 10:45:09

## 2021-04-09 ENCOUNTER — Encounter (HOSPITAL_BASED_OUTPATIENT_CLINIC_OR_DEPARTMENT_OTHER): Payer: 59 | Admitting: Internal Medicine

## 2021-04-09 DIAGNOSIS — E11621 Type 2 diabetes mellitus with foot ulcer: Secondary | ICD-10-CM | POA: Diagnosis not present

## 2021-04-09 LAB — GLUCOSE, CAPILLARY
Glucose-Capillary: 209 mg/dL — ABNORMAL HIGH (ref 70–99)
Glucose-Capillary: 162 mg/dL — ABNORMAL HIGH (ref 70–99)

## 2021-04-09 NOTE — Progress Notes (Addendum)
SUMEDH, SHINSATO ([id number]) Visit Report for 04/09/2021 Arrival Information Details Patient Name: Date of Service: [address], [name] 04/09/2021 9:00 A M Medical Record Number: [id number] Patient Account Number: [id number] Date of Birth/Sex: Treating RN: April 30, 1970 (51 y.o. [name] Primary Care Keaton Stirewalt: [name] Other Clinician: [name] Referring Graycen Degan: Treating Nabil Bubolz/Extender: [name], [name] in Treatment: 64 Visit Information History Since Last Visit All ordered tests and consults were completed: Yes Patient Arrived: [name] Added or deleted any medications: No Arrival Time: 09:06 Any new allergies or adverse reactions: No Accompanied By: self Had a fall or experienced change in No Transfer Assistance: None activities of daily living that may affect Patient Identification Verified: Yes risk of falls: Secondary Verification Process Completed: Yes Signs or symptoms of abuse/neglect since last visito No Patient Requires Transmission-Based Precautions: No Hospitalized since last visit: No Patient Has Alerts: No Implantable device outside of the clinic excluding No cellular tissue based products placed in the center since last visit: Pain Present Now: No Electronic Signature(s) Signed: 04/10/2021 8:34:43 AM By: [name] [name] , , Previous Signature: 04/09/2021 11:22:50 AM Version By: [name] [name] , , Entered By: [name] on 04/09/2021 11:23:13 -------------------------------------------------------------------------------- Encounter Discharge Information Details Patient Name: Date of Service: [name], [name] 04/09/2021 9:00 A M Medical Record Number: [id number] Patient Account Number: [id number] Date of Birth/Sex: Treating RN: 1970/12/16 (52 y.o. [name] Primary Care Tressia Labrum: [name] Other Clinician: [name] Referring Gaspare Netzel: Treating Zykeem Bauserman/Extender:  [name], [name] in Treatment: 30 Encounter Discharge Information Items Discharge Condition: Stable Ambulatory Status: Cane Discharge Destination: Home Transportation: Private Auto Accompanied By: self Schedule Follow-up Appointment: No Clinical Summary of Care: Electronic Signature(s) Signed: 04/10/2021 8:34:43 AM By: [name] [name] , , Entered By: [name] on 04/09/2021 11:39:20 -------------------------------------------------------------------------------- [place] Details Patient Name: Date of Service: [name], [name] 04/09/2021 9:00 A M Medical Record Number: [id number] Patient Account Number: [id number] Date of Birth/Sex: Treating RN: March 05, 1971 (51 y.o. [name] Primary Care Divinity Kyler: [name] Other Clinician: [name] Referring Aryssa Rosamond: Treating Tahir Blank/Extender: [name], [name] in Treatment: 30 Vital Signs Time Taken: 09:17 Temperature (F): 98.2 Height (in): 65 Pulse (bpm): 98 Weight (lbs): 196 Respiratory Rate (breaths/min): 20 Body Mass Index (BMI): 32.6 Blood Pressure (mmHg): 152/88 Capillary Blood Glucose (mg/dl): 209 Reference Range: 80 - 120 mg / dl Electronic Signature(s) Signed: 04/10/2021 8:34:43 AM By: [name] [name] , , Entered By: [name] on 04/09/2021 11:23:34

## 2021-04-10 ENCOUNTER — Encounter (HOSPITAL_BASED_OUTPATIENT_CLINIC_OR_DEPARTMENT_OTHER): Payer: 59 | Admitting: Internal Medicine

## 2021-04-10 ENCOUNTER — Other Ambulatory Visit: Payer: Self-pay

## 2021-04-10 DIAGNOSIS — L97514 Non-pressure chronic ulcer of other part of right foot with necrosis of bone: Secondary | ICD-10-CM | POA: Diagnosis not present

## 2021-04-10 DIAGNOSIS — M86671 Other chronic osteomyelitis, right ankle and foot: Secondary | ICD-10-CM

## 2021-04-10 DIAGNOSIS — E11621 Type 2 diabetes mellitus with foot ulcer: Secondary | ICD-10-CM

## 2021-04-10 LAB — GLUCOSE, CAPILLARY
Glucose-Capillary: 231 mg/dL — ABNORMAL HIGH (ref 70–99)
Glucose-Capillary: 154 mg/dL — ABNORMAL HIGH (ref 70–99)

## 2021-04-10 NOTE — Progress Notes (Signed)
CONSTANTIN, HILLERY ([id number]) Visit Report for 04/10/2021 SuperBill Details Patient Name: Date of Service: [address], [name] 04/10/2021 Medical Record Number: [id number] Patient Account Number: [id number] Date of Birth/Sex: Treating RN: 1971/01/06 (51 y.o. [name], [name] Primary Care Provider: [name] Other Clinician: [name] Referring Provider: Treating Provider/Extender: [name], [name] in Treatment: 31 Diagnosis Coding ICD-10 Codes Code Description [phone] Other chronic osteomyelitis, right ankle and foot E11.621 Type 2 diabetes mellitus with foot ulcer L97.514 Non-pressure chronic ulcer of other part of right foot with necrosis of bone T81.31XS Disruption of external operation (surgical) wound, not elsewhere classified, sequela F17.218 Nicotine dependence, cigarettes, with other nicotine-induced disorders L97.522 Non-pressure chronic ulcer of other part of left foot with fat layer exposed L84 Corns and callosities Facility Procedures CPT4 Code Description Modifier Quantity [id number] G0277-(Facility Use Only) HBOT full body chamber, 15min , 4 ICD-10 Diagnosis Description E11.621 Type 2 diabetes mellitus with foot ulcer L97.514 Non-pressure chronic ulcer of other part of right foot with necrosis of bone M86.671 Other chronic osteomyelitis, right ankle and foot Physician Procedures Quantity CPT4 Code Description Modifier 8413244 01027 - WC PHYS HYPERBARIC OXYGEN THERAPY 1 ICD-10 Diagnosis Description E11.621 Type 2 diabetes mellitus with foot ulcer L97.514 Non-pressure chronic ulcer of other part of right foot with necrosis of bone M86.671 Other chronic osteomyelitis, right ankle and foot Electronic Signature(s) Signed: 04/10/2021 11:30:10 AM By: [name] [name] , , Signed: 04/10/2021 12:30:10 PM By: Kalman Shan DO Entered By: [name] on 04/10/2021 11:30:05

## 2021-04-10 NOTE — Progress Notes (Addendum)
DALVIN, CLIPPER ([id number]) Visit Report for 04/10/2021 Arrival Information Details Patient Name: Date of Service: [address] [place] [address], [name] 04/10/2021 10:00 A M Medical Record Number: [id number] Patient Account Number: [id number] Date of Birth/Sex: Treating RN: 07/19/1970 (51 y.o. [name], Meta.Reding Primary Care Angelik Walls: [name] Other Clinician: [name] Referring Amahia Madonia: Treating Deonna Krummel/Extender: [name], [name] in Treatment: 101 Visit Information History Since Last Visit All ordered tests and consults were completed: Yes Patient Arrived: Cane Added or deleted any medications: No Arrival Time: 08:46 Any new allergies or adverse reactions: No Accompanied By: self Had a fall or experienced change in No Transfer Assistance: None activities of daily living that may affect Patient Identification Verified: Yes risk of falls: Secondary Verification Process Completed: Yes Signs or symptoms of abuse/neglect since last visito No Patient Requires Transmission-Based Precautions: No Hospitalized since last visit: No Patient Has Alerts: No Implantable device outside of the clinic excluding No cellular tissue based products placed in the center since last visit: Pain Present Now: No Electronic Signature(s) Signed: 04/10/2021 12:56:36 PM By: [name] [name] , , Previous Signature: 04/10/2021 11:22:36 AM Version By: [name] [name] , , Entered By: [name] on 04/10/2021 12:54:45 -------------------------------------------------------------------------------- Encounter Discharge Information Details Patient Name: Date of Service: [name], [name] 04/10/2021 10:00 A M Medical Record Number: [id number] Patient Account Number: [id number] Date of Birth/Sex: Treating RN: 04-02-1970 (51 y.o. [name] Primary Care Ayaan Shutes: [name] Other Clinician: [name] Referring [place]: Treating  Copeland Lapier/Extender: [name], [name] in Treatment: 31 Encounter Discharge Information Items Discharge Condition: Stable Ambulatory Status: Cane Discharge Destination: Home Transportation: Private Auto Accompanied By: self Schedule Follow-up Appointment: No Clinical Summary of Care: Electronic Signature(s) Signed: 04/10/2021 11:34:18 AM By: [name] [name] , , Entered By: [name] on 04/10/2021 11:34:17 -------------------------------------------------------------------------------- [place] Details Patient Name: Date of Service: [name], [name] 04/10/2021 10:00 A M Medical Record Number: [id number] Patient Account Number: [id number] Date of Birth/Sex: Treating RN: 1970/09/09 (51 y.o. [name], [name] Primary Care Beckie Viscardi: [name] Other Clinician: [name] Referring Gaynor Genco: Treating Kendalynn Wideman/Extender: [name], [name] in Treatment: 31 Vital Signs Time Taken: 09:02 Temperature (F): 98.2 Height (in): 65 Pulse (bpm): 98 Weight (lbs): 196 Respiratory Rate (breaths/min): 18 Body Mass Index (BMI): 32.6 Blood Pressure (mmHg): 136/87 Capillary Blood Glucose (mg/dl): 231 Reference Range: 80 - 120 mg / dl Electronic Signature(s) Signed: 04/10/2021 12:56:36 PM By: [name] [name] , , Entered By: [name] on 04/10/2021 11:23:22

## 2021-04-10 NOTE — Progress Notes (Signed)
KROY, SPRUNG ([id number]) Visit Report for 04/09/2021 SuperBill Details Patient Name: Date of Service: [address] [place] [address], [name] 04/09/2021 Medical Record Number: [id number] Patient Account Number: [id number] Date of Birth/Sex: Treating RN: 1970/05/18 (51 y.o. [name] Primary Care Provider: [name] Other Clinician: [name] Referring Provider: Treating Provider/Extender: [name], [name] in Treatment: 30 Diagnosis Coding ICD-10 Codes Code Description [phone] Other chronic osteomyelitis, right ankle and foot E11.621 Type 2 diabetes mellitus with foot ulcer L97.514 Non-pressure chronic ulcer of other part of right foot with necrosis of bone T81.31XS Disruption of external operation (surgical) wound, not elsewhere classified, sequela F17.218 Nicotine dependence, cigarettes, with other nicotine-induced disorders L97.522 Non-pressure chronic ulcer of other part of left foot with fat layer exposed L84 Corns and callosities Facility Procedures CPT4 Code Description Modifier Quantity [id number] G0277-(Facility Use Only) HBOT full body chamber, 90min , 4 ICD-10 Diagnosis Description E11.621 Type 2 diabetes mellitus with foot ulcer L97.514 Non-pressure chronic ulcer of other part of right foot with necrosis of bone M86.671 Other chronic osteomyelitis, right ankle and foot Physician Procedures Quantity CPT4 Code Description Modifier [id number] 38250 - WC PHYS HYPERBARIC OXYGEN THERAPY 1 ICD-10 Diagnosis Description E11.621 Type 2 diabetes mellitus with foot ulcer L97.514 Non-pressure chronic ulcer of other part of right foot with necrosis of bone M86.671 Other chronic osteomyelitis, right ankle and foot Electronic Signature(s) Signed: 04/09/2021 4:29:39 PM By: [name] MD Signed: 04/10/2021 8:34:43 AM By: [name] [name] , , Entered By: [name] on 04/09/2021 11:39:00

## 2021-04-10 NOTE — Progress Notes (Addendum)
[name], [name] ([id number]) Visit Report for 04/10/2021 HBO Details Patient Name: Date of Service: [address] RD, [name] 04/10/2021 10:00 A M Medical Record Number: [id number] Patient Account Number: [id number] Date of Birth/Sex: Treating RN: [birth year] (51 y.o. [name], [name] Primary Care Early Ord: [name] Other Clinician: Donavan [name] Referring [name]: Treating [name]/Extender: [name], [name] in Treatment: 31 HBO Treatment Course Details Treatment Course Number: 1 Ordering [name]: Worthy Keeler [name] Treatments Ordered: otal 60 HBO Treatment Start Date: 12/08/2020 HBO Indication: Diabetic Ulcer(s) of the Lower Extremity HBO Treatment Details Treatment Number: 53 Patient Type: Outpatient Chamber Type: Monoplace Chamber Serial #: [id number] Treatment Protocol: 2.5 ATA with 90 minutes oxygen, with two 5 minute air breaks Treatment Details Compression Rate Down: 2.0 psi / minute De-Compression Rate Up: 2.0 psi / minute A breaks and breathing ir Compress Tx Pressure periods Decompress Decompress Begins Reached (leave unused spaces Begins Ends blank) Chamber Pressure (ATA 1 2.5 2.5 2.5 2.5 2.5 - - 2.5 1 ) Clock Time (24 hr) 09:08 09:19 09:49 09:54 10:25 10:30 - - 10:59 11:10 Treatment Length: 122 (minutes) Treatment Segments: 4 Vital Signs Capillary Blood Glucose Reference Range: 80 - 120 mg / dl HBO Diabetic Blood Glucose Intervention Range: <131 mg/dl or >249 mg/dl Time Vitals Blood Respiratory Capillary Blood Glucose Pulse Action Type: Pulse: Temperature: Taken: Pressure: Rate: Glucose (mg/dl): Meter #: Oximetry (%) Taken: Pre 09:02 136/87 98 18 98.2 231 2 Post 11:13 125/75 90 18 97.8 154 2 Treatment Response Treatment Toleration: Well Treatment Completion Status: Treatment Completed without Adverse Event Additional Procedure Documentation Tissue Sevierity: Bone involvement without necrosis Physician HBO Attestation: I certify  that I supervised this HBO treatment in accordance with Medicare guidelines. A trained emergency response team is readily available per Yes hospital policies and procedures. Continue HBOT as ordered. Yes Electronic Signature(s) Signed: 04/10/2021 12:30:10 PM By: Joseph Shan DO Previous Signature: 04/10/2021 11:29:38 AM Version By: [name] [name] , , Entered By: [name] on 04/10/2021 12:29:46 -------------------------------------------------------------------------------- HBO Safety Checklist Details Patient Name: Date of Service: [name] RD, [name] 04/10/2021 10:00 A M Medical Record Number: [id number] Patient Account Number: [id number] Date of Birth/Sex: Treating RN: [birth year] (51 y.o. [name], [name] Primary Care [name]: [name] Other Clinician: Donavan [name] Referring [name]: Treating [name]/Extender: [name], [name] in Treatment: 31 HBO Safety Checklist Items Safety Checklist Consent Form Signed Patient voided / foley secured and emptied When did you last eato 0830 Last dose of injectable or oral agent 0730 Ostomy pouch emptied and vented if applicable NA All implantable devices assessed, documented and approved NA Intravenous access site secured and place NA Valuables secured Linens and cotton and cotton/polyester blend (less than 51% polyester) Personal oil-based products / skin lotions / body lotions removed Wigs or hairpieces removed NA Smoking or tobacco materials removed NA Books / newspapers / magazines / loose paper removed Cologne, aftershave, perfume and deodorant removed Jewelry removed (may wrap wedding band) Make-up removed NA Hair care products removed Battery operated devices (external) removed Heating patches and chemical warmers removed Titanium eyewear removed NA Nail polish cured greater than 10 hours NA Casting material cured greater than 10 hours NA Hearing aids  removed NA Loose dentures or partials removed NA Prosthetics have been removed NA Patient demonstrates correct use of air break device (if applicable) Patient concerns have been addressed Patient grounding bracelet on and cord attached to chamber Specifics for Inpatients (complete in addition to above) Medication sheet  sent with patient NA Intravenous medications needed or due during therapy sent with patient NA Drainage tubes (e.g. nasogastric tube or chest tube secured and vented) NA Endotracheal or Tracheotomy tube secured NA Cuff deflated of air and inflated with saline NA Airway suctioned NA Notes Paper version used prior to treatment. Electronic Signature(s) Signed: 04/10/2021 11:27:23 AM By: [name] [name] , , Entered By: [name] on 04/10/2021 11:27:23

## 2021-04-10 NOTE — Progress Notes (Signed)
Joseph, [name] ([id number]) Visit Report for 04/09/2021 HBO Details Patient Name: Date of Service: Joseph RD, [name] 04/09/2021 9:00 A M Medical Record Number: [id number] Patient Account Number: [id number] Date of Birth/Sex: Treating RN: Jan 29, 1971 (51 y.o. [name] Primary Care [name]: [name] Other Clinician: Donavan [name] Referring [name]: Treating [name]/Extender: [name], [name] in Treatment: 30 HBO Treatment Course Details Treatment Course Number: 1 Ordering [name]: [name] T Treatments Ordered: otal 60 HBO Treatment Start Date: 12/08/2020 HBO Indication: Diabetic Ulcer(s) of the Lower Extremity HBO Treatment Details Treatment Number: 52 Patient Type: Outpatient Chamber Type: Monoplace Chamber Serial #: [id number] Treatment Protocol: 2.5 ATA with 90 minutes oxygen, with two 5 minute air breaks Treatment Details Compression Rate Down: 2.0 psi / minute De-Compression Rate Up: 2.0 psi / minute A breaks and breathing ir Compress Tx Pressure periods Decompress Decompress Begins Reached (leave unused spaces Begins Ends blank) Chamber Pressure (ATA 1 2.5 2.5 2.5 2.5 2.5 - - 2.5 1 ) Clock Time (24 hr) 09:22 09:34 10:04 10:09 10:39 10:44 - - 11:14 11:25 Treatment Length: 123 (minutes) Treatment Segments: 4 Vital Signs Capillary Blood Glucose Reference Range: 80 - 120 mg / dl HBO Diabetic Blood Glucose Intervention Range: <131 mg/dl or >249 mg/dl Time Vitals Blood Respiratory Capillary Blood Glucose Pulse Action Type: Pulse: Temperature: Taken: Pressure: Rate: Glucose (mg/dl): Meter #: Oximetry (%) Taken: Pre 09:17 152/88 98 20 98.2 209 2 Post 11:30 136/85 97 18 98 162 2 Treatment Response Treatment Toleration: Well Treatment Completion Status: Treatment Completed without Adverse Event Additional Procedure Documentation Tissue Sevierity: Bone involvement without necrosis [name] Notes No concerns with  treatment given Physician HBO Attestation: I certify that I supervised this HBO treatment in accordance with Medicare guidelines. A trained emergency response team is readily available per Yes hospital policies and procedures. Continue HBOT as ordered. Yes Electronic Signature(s) Signed: 04/09/2021 4:29:39 PM By: Joseph Ham MD Entered By: [name] on 04/09/2021 16:25:47 -------------------------------------------------------------------------------- HBO Safety Checklist Details Patient Name: Date of Service: Joseph Hahn RD, [name] 04/09/2021 9:00 A M Medical Record Number: [id number] Patient Account Number: [id number] Date of Birth/Sex: Treating RN: 1970-12-09 (51 y.o. [name] Primary Care Katelan [name]: [name] Other Clinician: Donavan [name] Referring Hawken Bielby: Treating [name]/Extender: [name], [name] in Treatment: 30 HBO Safety Checklist Items Safety Checklist Consent Form Signed Patient voided / foley secured and emptied When did you last eato 0730 Last dose of injectable or oral agent 0730 Ostomy pouch emptied and vented if applicable NA All implantable devices assessed, documented and approved NA Intravenous access site secured and place NA Valuables secured Linens and cotton and cotton/polyester blend (less than 51% polyester) Personal oil-based products / skin lotions / body lotions removed Wigs or hairpieces removed NA Smoking or tobacco materials removed NA Books / newspapers / magazines / loose paper removed Cologne, aftershave, perfume and deodorant removed Jewelry removed (may wrap wedding band) Make-up removed NA Hair care products removed Battery operated devices (external) removed Heating patches and chemical warmers removed NA Titanium eyewear removed NA Nail polish cured greater than 10 hours NA Casting material cured greater than 10 hours NA Hearing aids removed NA Loose dentures or partials  removed NA Prosthetics have been removed NA Patient demonstrates correct use of air break device (if applicable) Patient concerns have been addressed Patient grounding bracelet on and cord attached to chamber Specifics for Inpatients (complete in addition to above) Medication sheet sent with patient NA Intravenous medications  needed or due during therapy sent with patient NA Drainage tubes (e.g. nasogastric tube or chest tube secured and vented) NA Endotracheal or Tracheotomy tube secured NA Cuff deflated of air and inflated with saline NA Airway suctioned NA Notes Paper version used prior to treatment. Electronic Signature(s) Signed: 04/10/2021 8:34:43 AM By: [name] [name] , , Entered By: [name] on 04/09/2021 11:24:31

## 2021-04-13 ENCOUNTER — Ambulatory Visit (HOSPITAL_COMMUNITY)
Admission: RE | Admit: 2021-04-13 | Discharge: 2021-04-13 | Disposition: A | Discharge status: Home / Self Care | Payer: 59 | Source: Ambulatory Visit | Attending: Physician Assistant | Admitting: Physician Assistant

## 2021-04-13 ENCOUNTER — Other Ambulatory Visit: Payer: Self-pay

## 2021-04-13 ENCOUNTER — Encounter (HOSPITAL_BASED_OUTPATIENT_CLINIC_OR_DEPARTMENT_OTHER): Payer: 59 | Admitting: Internal Medicine

## 2021-04-13 DIAGNOSIS — L97514 Non-pressure chronic ulcer of other part of right foot with necrosis of bone: Secondary | ICD-10-CM

## 2021-04-13 DIAGNOSIS — E11621 Type 2 diabetes mellitus with foot ulcer: Secondary | ICD-10-CM

## 2021-04-13 DIAGNOSIS — M86671 Other chronic osteomyelitis, right ankle and foot: Secondary | ICD-10-CM | POA: Diagnosis not present

## 2021-04-13 LAB — GLUCOSE, CAPILLARY
Glucose-Capillary: 206 mg/dL — ABNORMAL HIGH (ref 70–99)
Glucose-Capillary: 243 mg/dL — ABNORMAL HIGH (ref 70–99)

## 2021-04-13 MED ORDER — GADOBUTROL 1 MMOL/ML IV SOLN
9.0000 mL | Freq: Once | INTRAVENOUS | Status: AC | PRN
  Administered 2021-04-13: 9 mL via INTRAVENOUS

## 2021-04-13 NOTE — Progress Notes (Addendum)
QUINNTEN, CALVIN ([id number]) Visit Report for 04/13/2021 HBO Details Patient Name: Date of Service: [place] RD, [name] 04/13/2021 10:00 A M Medical Record Number: [id number] Patient Account Number: [id number] Date of Birth/Sex: Treating RN: [birth year] (51 y.o. [name] Primary Care Breleigh Carpino: [name] Other Clinician: [name] Referring Meckenzie Balsley: Treating Arlette Schaad/Extender: [name], [name] in Treatment: 31 HBO Treatment Course Details Treatment Course Number: 1 Ordering Lelynd Poer: [name] Treatments Ordered: otal 60 HBO Treatment Start Date: 12/08/2020 HBO Indication: Diabetic Ulcer(s) of the Lower Extremity HBO Treatment Details Treatment Number: 54 Patient Type: Outpatient Chamber Type: Monoplace Chamber Serial #: [id number] Treatment Protocol: 2.5 ATA with 90 minutes oxygen, with two 5 minute air breaks Treatment Details Compression Rate Down: 2.0 psi / minute De-Compression Rate Up: 2.0 psi / minute A breaks and breathing ir Compress Tx Pressure periods Decompress Decompress Begins Reached (leave unused spaces Begins Ends blank) Chamber Pressure (ATA 1 2.5 2.5 2.5 2.5 2.5 - - 2.5 1 ) Clock Time (24 hr) 09:38 09:53 10:23 10:28 10:58 11:03 - - 11:33 11:44 Treatment Length: 126 (minutes) Treatment Segments: 4 Vital Signs Capillary Blood Glucose Reference Range: 80 - 120 mg / dl HBO Diabetic Blood Glucose Intervention Range: <131 mg/dl or >249 mg/dl Time Vitals Blood Respiratory Capillary Blood Glucose Pulse Action Type: Pulse: Temperature: Taken: Pressure: Rate: Glucose (mg/dl): Meter #: Oximetry (%) Taken: Pre 09:19 144/80 93 16 98.1 206 2 Post 11:47 143/87 87 16 98.2 243 2 Treatment Response Treatment Toleration: Well Treatment Completion Status: Treatment Completed without Adverse Event Additional Procedure Documentation Tissue Sevierity: Bone involvement without necrosis Physician HBO Attestation: I certify  that I supervised this HBO treatment in accordance with Medicare guidelines. A trained emergency response team is readily available per Yes hospital policies and procedures. Continue HBOT as ordered. Yes Electronic Signature(s) Signed: 04/14/2021 9:05:14 AM By: Kalman Shan DO Previous Signature: 04/13/2021 12:21:35 PM Version By: [name] [name] , , Entered By: Kalman Shan on 04/13/2021 15:19:28 -------------------------------------------------------------------------------- HBO Safety Checklist Details Patient Name: Date of Service: [name], [name] 04/13/2021 10:00 A M Medical Record Number: [id number] Patient Account Number: [id number] Date of Birth/Sex: Treating RN: March 20, 1971 (51 y.o. [name] Primary Care Daryel Kenneth: [name] Other Clinician: [name] Referring Dayvon Dax: Treating Gesselle Fitzsimons/Extender: [name], [name] in Treatment: 31 HBO Safety Checklist Items Safety Checklist Consent Form Signed Patient voided / foley secured and emptied When did you last eato 0830 Last dose of injectable or oral agent 0700 Ostomy pouch emptied and vented if applicable NA All implantable devices assessed, documented and approved NA Intravenous access site secured and place NA Valuables secured Linens and cotton and cotton/polyester blend (less than 51% polyester) Personal oil-based products / skin lotions / body lotions removed Wigs or hairpieces removed NA Smoking or tobacco materials removed NA Books / newspapers / magazines / loose paper removed Cologne, aftershave, perfume and deodorant removed Jewelry removed (may wrap wedding band) Make-up removed NA Hair care products removed Battery operated devices (external) removed Heating patches and chemical warmers removed Titanium eyewear removed NA Nail polish cured greater than 10 hours NA Casting material cured greater than 10 hours NA Hearing aids  removed NA Loose dentures or partials removed NA Prosthetics have been removed NA Patient demonstrates correct use of air break device (if applicable) Patient concerns have been addressed Patient grounding bracelet on and cord attached to chamber Specifics for Inpatients (complete in addition to above) Medication sheet  sent with patient NA Intravenous medications needed or due during therapy sent with patient NA Drainage tubes (e.g. nasogastric tube or chest tube secured and vented) NA Endotracheal or Tracheotomy tube secured NA Cuff deflated of air and inflated with saline NA Airway suctioned NA Notes Paper version used prior to treatment. Electronic Signature(s) Signed: 04/13/2021 12:20:19 PM By: [name] [name] , , Entered By: [name] on 04/13/2021 12:20:19

## 2021-04-14 ENCOUNTER — Encounter (HOSPITAL_BASED_OUTPATIENT_CLINIC_OR_DEPARTMENT_OTHER): Payer: 59 | Admitting: Internal Medicine

## 2021-04-14 NOTE — Progress Notes (Signed)
ROSHARD, REZABEK ([id number]) Visit Report for 04/13/2021 Arrival Information Details Patient Name: Date of Service: [name], [name] [facility] [name]. 04/13/2021 10:00 A M Medical Record Number: [id number] Patient Account Number: [id number] Date of Birth/Sex: Treating RN: 19-May-1970 (51 y.o. [name] Primary Care [name]: [name] Other Clinician: Donavan [name] Referring [name]: Treating [name] in [name]: 32 Visit Information History Since Last Visit All ordered tests and consults were completed: Yes Patient Arrived: Cane Added or deleted any medications: No Arrival Time: 09:11 Any new allergies or adverse reactions: No Accompanied By: self Had a fall or experienced change in No Transfer Assistance: None activities of daily living that may affect Patient Identification Verified: Yes risk of falls: Secondary Verification Process Completed: Yes Signs or symptoms of abuse/neglect since last visito No Patient Requires Transmission-Based Precautions: No Hospitalized since last visit: No Patient Has Alerts: No Implantable device outside of the clinic excluding No cellular tissue based products placed in the center since last visit: Pain Present Now: No Electronic Signature(s) Signed: 04/14/2021 10:16:42 AM By: [name] [name] , , Entered By: [name] on 04/13/2021 12:06:57 -------------------------------------------------------------------------------- Encounter Discharge Information Details Patient Name: Date of Service: [name], [place]. 04/13/2021 10:00 A M Medical Record Number: [id number] Patient Account Number: [id number] Date of Birth/Sex: Treating RN: November 18, 1970 (51 y.o. [name] Primary Care [name]: [name] Other Clinician: Donavan [name] Referring Shemia Bevel: Treating [name]: 31 Encounter Discharge  Information Items Discharge Condition: Stable Ambulatory Status: Cane Discharge Destination: Other (Note Required) Transportation: Private Auto Accompanied By: self Schedule Follow-up Appointment: No Clinical Summary of Care: Notes Patient states that he has a doctor appointment elsewhere after [name]. Electronic Signature(s) Signed: 04/14/2021 10:16:42 AM By: [name] [name] , , Entered By: [name] on 04/13/2021 12:22:58 -------------------------------------------------------------------------------- Vitals Details Patient Name: Date of Service: [name]. 04/13/2021 10:00 A M Medical Record Number: [id number] Patient Account Number: [id number] Date of Birth/Sex: Treating RN: 06-04-1970 (51 y.o. [name] Primary Care Aqil Goetting: [name] Other Clinician: Donavan [name] Referring [name]: Treating [name]/Extender: [name], [name] in [name]: 31 Vital Signs Time Taken: 09:19 Temperature (F): 98.1 Height (in): 65 Pulse (bpm): 93 Weight (lbs): 196 Respiratory Rate (breaths/min): 16 Body Mass Index (BMI): 32.6 Blood Pressure (mmHg): 144/80 Capillary Blood Glucose (mg/dl): 206 Reference Range: 80 - 120 mg / dl Electronic Signature(s) Signed: 04/14/2021 10:16:42 AM By: [name] [name] , , Entered By: [name] on 04/13/2021 12:07:37

## 2021-04-14 NOTE — Progress Notes (Signed)
[name], [name] ([id number]) Visit Report for 04/13/2021 SuperBill Details Patient Name: Date of Service: [place] RD, [name] 04/13/2021 Medical Record Number: [id number] Patient Account Number: [id number] Date of Birth/Sex: Treating RN: 1970/05/15 (51 y.o. [name] Primary Care Provider: [name] Other Clinician: [name] Referring Provider: Treating Provider/Extender: [name], [name] in Treatment: 31 Diagnosis Coding ICD-10 Codes Code Description [phone] Other chronic osteomyelitis, right ankle and foot E11.621 Type 2 diabetes mellitus with foot ulcer L97.514 Non-pressure chronic ulcer of other part of right foot with necrosis of bone T81.31XS Disruption of external operation (surgical) wound, not elsewhere classified, sequela F17.218 Nicotine dependence, cigarettes, with other nicotine-induced disorders L97.522 Non-pressure chronic ulcer of other part of left foot with fat layer exposed L84 Corns and callosities Facility Procedures CPT4 Code Description Modifier Quantity [id number] G0277-(Facility Use Only) HBOT full body chamber, 19min , 4 ICD-10 Diagnosis Description E11.621 Type 2 diabetes mellitus with foot ulcer L97.514 Non-pressure chronic ulcer of other part of right foot with necrosis of bone M86.671 Other chronic osteomyelitis, right ankle and foot Physician Procedures Quantity CPT4 Code Description Modifier [id number] 19914 - WC PHYS HYPERBARIC OXYGEN THERAPY 1 ICD-10 Diagnosis Description E11.621 Type 2 diabetes mellitus with foot ulcer L97.514 Non-pressure chronic ulcer of other part of right foot with necrosis of bone M86.671 Other chronic osteomyelitis, right ankle and foot Electronic Signature(s) Signed: 04/14/2021 9:05:14 AM By: Kalman Shan DO Signed: 04/14/2021 10:16:42 AM By: [name] [name] , , Entered By: [name] on 04/13/2021 12:22:07

## 2021-04-15 ENCOUNTER — Other Ambulatory Visit: Payer: Self-pay

## 2021-04-15 ENCOUNTER — Encounter (HOSPITAL_BASED_OUTPATIENT_CLINIC_OR_DEPARTMENT_OTHER): Payer: 59 | Admitting: Internal Medicine

## 2021-04-15 DIAGNOSIS — A4101 Sepsis due to Methicillin susceptible Staphylococcus aureus: Secondary | ICD-10-CM | POA: Diagnosis not present

## 2021-04-15 DIAGNOSIS — M86671 Other chronic osteomyelitis, right ankle and foot: Secondary | ICD-10-CM | POA: Diagnosis not present

## 2021-04-15 LAB — GLUCOSE, CAPILLARY
Glucose-Capillary: 197 mg/dL — ABNORMAL HIGH (ref 70–99)
Glucose-Capillary: 126 mg/dL — ABNORMAL HIGH (ref 70–99)

## 2021-04-15 NOTE — Progress Notes (Signed)
RACER, QUAM ([id number]) Visit Report for 04/15/2021 SuperBill Details Patient Name: Date of Service: [place] RD, [name] 04/15/2021 Medical Record Number: [id number] Patient Account Number: [id number] Date of Birth/Sex: Treating RN: [birth year] (51 y.o. [name], [name] Primary Care Provider: [name] Other Clinician: [name] Referring Provider: Treating Provider/Extender: [name], [name] in Treatment: 31 Diagnosis Coding ICD-10 Codes Code Description [phone] Other chronic osteomyelitis, right ankle and foot E11.621 Type 2 diabetes mellitus with foot ulcer L97.514 Non-pressure chronic ulcer of other part of right foot with necrosis of bone T81.31XS Disruption of external operation (surgical) wound, not elsewhere classified, sequela F17.218 Nicotine dependence, cigarettes, with other nicotine-induced disorders L97.522 Non-pressure chronic ulcer of other part of left foot with fat layer exposed L84 Corns and callosities Facility Procedures CPT4 Code Description Modifier Quantity [id number] G0277-(Facility Use Only) HBOT full body chamber, 17min , 4 ICD-10 Diagnosis Description E11.621 Type 2 diabetes mellitus with foot ulcer L97.514 Non-pressure chronic ulcer of other part of right foot with necrosis of bone M86.671 Other chronic osteomyelitis, right ankle and foot Physician Procedures Quantity CPT4 Code Description Modifier [id number] 40814 - WC PHYS HYPERBARIC OXYGEN THERAPY 1 ICD-10 Diagnosis Description E11.621 Type 2 diabetes mellitus with foot ulcer L97.514 Non-pressure chronic ulcer of other part of right foot with necrosis of bone M86.671 Other chronic osteomyelitis, right ankle and foot Electronic Signature(s) Signed: 04/15/2021 11:28:24 AM By: [name] [name] , , Signed: 04/15/2021 3:43:46 PM By: [name] MD Entered By: [name] on 04/15/2021 11:28:22

## 2021-04-15 NOTE — Progress Notes (Addendum)
Joseph, [name] ([id number]) Visit Report for 04/15/2021 HBO Details Patient Name: Date of Service: [name] RD, [name] 04/15/2021 10:00 A M Medical Record Number: [id number] Patient Account Number: [id number] Date of Birth/Sex: Treating RN: 01/16/1971 (51 y.o. [name], [place] Primary Care [name]: [name] Other Clinician: Donavan [name] Referring [name]: Treating [name]: [name], [name] in Treatment: 31 HBO Treatment Course Details Treatment Course Number: 1 Ordering [name]: [name] Treatments Ordered: otal 60 HBO Treatment Start Date: 12/08/2020 HBO Indication: Diabetic Ulcer(s) of the Lower Extremity HBO Treatment Details Treatment Number: 55 Patient Type: Outpatient Chamber Type: Monoplace Chamber Serial #: [id number] Treatment Protocol: 2.5 ATA with 90 minutes oxygen, with two 5 minute air breaks Treatment Details Compression Rate Down: 2.0 psi / minute De-Compression Rate Up: 2.0 psi / minute A breaks and breathing ir Compress Tx Pressure periods Decompress Decompress Begins Reached (leave unused spaces Begins Ends blank) Chamber Pressure (ATA 1 2.5 2.5 2.5 2.5 2.5 - - 2.5 1 ) Clock Time (24 hr) 09:02 09:13 09:43 09:48 10:18 10:23 - - 10:53 11:04 Treatment Length: 122 (minutes) Treatment Segments: 4 Vital Signs Capillary Blood Glucose Reference Range: 80 - 120 mg / dl HBO Diabetic Blood Glucose Intervention Range: <131 mg/dl or >249 mg/dl Time Vitals Blood Respiratory Capillary Blood Glucose Pulse Action Type: Pulse: Temperature: Taken: Pressure: Rate: Glucose (mg/dl): Meter #: Oximetry (%) Taken: Pre 08:43 131/72 91 20 98.3 197 2 Post 11:07 138/82 87 18 98.2 126 2 Treatment Response Treatment Toleration: Well Treatment Completion Status: Treatment Completed without Adverse Event Additional Procedure Documentation Tissue Sevierity: Bone involvement without necrosis [name] Notes No concerns with  treatment given Physician HBO Attestation: I certify that I supervised this HBO treatment in accordance with Medicare guidelines. A trained emergency response team is readily available per Yes hospital policies and procedures. Continue HBOT as ordered. Yes Electronic Signature(s) Signed: 04/15/2021 4:03:30 PM By: Joseph Ham MD Previous Signature: 04/15/2021 11:27:26 AM Version By: [name] [name] , , Previous Signature: 04/15/2021 3:43:46 PM Version By: Joseph Ham MD Entered By: [name] on 04/15/2021 15:55:30 -------------------------------------------------------------------------------- HBO Safety Checklist Details Patient Name: Date of Service: [name] RD, [name] 04/15/2021 10:00 A M Medical Record Number: [id number] Patient Account Number: [id number] Date of Birth/Sex: Treating RN: [birth year] (51 y.o. [name], [name] Primary Care [name]: [name] Other Clinician: Donavan [name] Referring [name]: Treating [name]/Extender: [name], [name] in Treatment: 31 HBO Safety Checklist Items Safety Checklist Consent Form Signed Patient voided / foley secured and emptied When did you last eato 0730 Last dose of injectable or oral agent 0730 Ostomy pouch emptied and vented if applicable NA All implantable devices assessed, documented and approved NA Intravenous access site secured and place NA Valuables secured Linens and cotton and cotton/polyester blend (less than 51% polyester) Personal oil-based products / skin lotions / body lotions removed Wigs or hairpieces removed NA Smoking or tobacco materials removed NA Books / newspapers / magazines / loose paper removed Cologne, aftershave, perfume and deodorant removed Jewelry removed (may wrap wedding band) Make-up removed NA Hair care products removed Battery operated devices (external) removed Heating patches and chemical warmers removed Titanium eyewear  removed NA Nail polish cured greater than 10 hours NA Casting material cured greater than 10 hours NA Hearing aids removed NA Loose dentures or partials removed NA Prosthetics have been removed NA Patient demonstrates correct use of air break device (if applicable) Patient concerns have been addressed Patient grounding  bracelet on and cord attached to chamber Specifics for Inpatients (complete in addition to above) Medication sheet sent with patient NA Intravenous medications needed or due during therapy sent with patient NA Drainage tubes (e.g. nasogastric tube or chest tube secured and vented) NA Endotracheal or Tracheotomy tube secured NA Cuff deflated of air and inflated with saline NA Airway suctioned NA Electronic Signature(s) Signed: 04/15/2021 11:25:37 AM By: [name] [name] , , Entered By: [name] on 04/15/2021 11:25:37

## 2021-04-15 NOTE — Progress Notes (Signed)
[name], [name] ([id number]) Visit Report for 04/15/2021 SuperBill Details Patient Name: Date of Service: [place] RD, [name] 04/15/2021 Medical Record Number: [id number] Patient Account Number: [id number] Date of Birth/Sex: Treating RN: [birth year] (51 y.o. [name] Primary Care Provider: [name] Other Clinician: Referring Provider: Treating Provider/Extender: [name], [name] in Treatment: 31 Diagnosis Coding ICD-10 Codes Code Description [phone] Other chronic osteomyelitis, right ankle and foot E11.621 Type 2 diabetes mellitus with foot ulcer L97.514 Non-pressure chronic ulcer of other part of right foot with necrosis of bone T81.31XS Disruption of external operation (surgical) wound, not elsewhere classified, sequela F17.218 Nicotine dependence, cigarettes, with other nicotine-induced disorders L97.522 Non-pressure chronic ulcer of other part of left foot with fat layer exposed L84 Corns and callosities Facility Procedures CPT4 Code Description Modifier Quantity [id number] - WOUND CARE VISIT-LEV 3 EST PT 1 Electronic Signature(s) Signed: 04/15/2021 3:43:46 PM By: [name] MD Signed: 04/15/2021 4:46:25 PM By: Baruch Gouty RN, BSN Entered By: Baruch Gouty on 04/15/2021 08:43:24

## 2021-04-15 NOTE — Progress Notes (Signed)
[name], [name] ([id number]) Visit Report for 04/15/2021 Arrival Information Details Patient Name: Date of Service: [place] RD, [name] 04/15/2021 10:00 A M Medical Record Number: [id number] Patient Account Number: [id number] Date of Birth/Sex: Treating RN: [birth year] (51 y.o. [name], [name] Primary Care [name]: [name] Other Clinician: Donavan [name] Referring [name]: Treating [name]/Extender: [name], [name] in Treatment: 1 Visit Information History Since Last Visit All ordered tests and consults were completed: Yes Patient Arrived: [name] Added or deleted any medications: No Arrival Time: 07:58 Any new allergies or adverse reactions: No Accompanied By: self Had a fall or experienced change in No Transfer Assistance: None activities of daily living that may affect Patient Identification Verified: Yes risk of falls: Secondary Verification Process Completed: Yes Signs or symptoms of abuse/neglect since last visito No Patient Requires Transmission-Based Precautions: No Hospitalized since last visit: No Patient Has Alerts: No Implantable device outside of the clinic excluding No cellular tissue based products placed in the center since last visit: Pain Present Now: No Electronic Signature(s) Signed: 04/15/2021 11:23:30 AM By: [name] [name] , , Entered By: [name] on 04/15/2021 11:23:30 -------------------------------------------------------------------------------- Encounter Discharge Information Details Patient Name: Date of Service: [name] RD, [name] 04/15/2021 10:00 A M Medical Record Number: [id number] Patient Account Number: [id number] Date of Birth/Sex: Treating RN: [birth year] (51 y.o. [name], [name] Primary Care [name]: [name] Other Clinician: Donavan [name] Referring [name]: Treating [name]/Extender: [name], [name] in Treatment: 31 Encounter Discharge  Information Items Discharge Condition: Stable Ambulatory Status: Cane Discharge Destination: Home Transportation: Private Auto Accompanied By: self Schedule Follow-up Appointment: No Clinical Summary of Care: Electronic Signature(s) Signed: 04/15/2021 11:28:52 AM By: [name] [name] , , Entered By: [name] on 04/15/2021 11:28:52 -------------------------------------------------------------------------------- [place] Details Patient Name: Date of Service: [name] RD, [name] 04/15/2021 10:00 A M Medical Record Number: [id number] Patient Account Number: [id number] Date of Birth/Sex: Treating RN: 04/05/1970 (51 y.o. [name], [name] Primary Care [name]: [name] Other Clinician: Donavan [name] Referring [name]: Treating [name]/Extender: [name], [name] in Treatment: 31 Vital Signs Time Taken: 08:43 Temperature (F): 98.3 Height (in): 65 Pulse (bpm): 91 Weight (lbs): 196 Respiratory Rate (breaths/min): 20 Body Mass Index (BMI): 32.6 Blood Pressure (mmHg): 131/72 Capillary Blood Glucose (mg/dl): 197 Reference Range: 80 - 120 mg / dl Electronic Signature(s) Signed: 04/15/2021 11:23:59 AM By: [name] [name] , , Entered By: [name] on 04/15/2021 11:23:59

## 2021-04-15 NOTE — Progress Notes (Signed)
JAMESMICHAEL, SHADD ([id number]) Visit Report for 04/15/2021 Arrival Information Details Patient Name: Date of Service: [place] RD, [name] 04/15/2021 8:15 A M Medical Record Number: [id number] Patient Account Number: [id number] Date of Birth/Sex: Treating RN: 27-Feb-1971 (51 y.o. [name], [name] Primary Care Revanth Neidig: Minette Brine, [place] Other Clinician: Referring Sylvia Kondracki: Treating Sherri Levenhagen/Extender: [name], [name] in Treatment: 16 Visit Information History Since Last Visit Added or deleted any medications: No Patient Arrived: [name] Any new allergies or adverse reactions: No Arrival Time: 08:04 Had a fall or experienced change in No Accompanied By: self activities of daily living that may affect Transfer Assistance: None risk of falls: Patient Identification Verified: Yes Signs or symptoms of abuse/neglect since last visito No Secondary Verification Process Completed: Yes Hospitalized since last visit: No Patient Requires Transmission-Based Precautions: No Implantable device outside of the clinic excluding No Patient Has Alerts: No cellular tissue based products placed in the center since last visit: Has Dressing in Place as Prescribed: Yes Pain Present Now: Yes Electronic Signature(s) Signed: 04/15/2021 4:46:25 PM By: Baruch Gouty RN, BSN Entered By: Baruch Gouty on 04/15/2021 08:07:19 -------------------------------------------------------------------------------- Clinic Level of Care Assessment Details Patient Name: Date of Service: [facility] RD, [name] 04/15/2021 8:15 A M Medical Record Number: [id number] Patient Account Number: [id number] Date of Birth/Sex: Treating RN: 22-Jan-1971 (51 y.o. [name] Primary Care Davene Jobin: [name] Other Clinician: Referring Makiyah Zentz: Treating Vihan Santagata/Extender: [name], [name] in Treatment: 31 Clinic Level of Care Assessment Items TOOL 4 Quantity Score []  - 0 Use when only an  EandM is performed on FOLLOW-UP visit ASSESSMENTS - Nursing Assessment / Reassessment X- 1 10 Reassessment of Co-morbidities (includes updates in patient status) X- 1 5 Reassessment of Adherence to Treatment Plan ASSESSMENTS - Wound and Skin A ssessment / Reassessment []  - 0 Simple Wound Assessment / Reassessment - one wound X- 2 5 Complex Wound Assessment / Reassessment - multiple wounds []  - 0 Dermatologic / Skin Assessment (not related to wound area) ASSESSMENTS - Focused Assessment []  - 0 Circumferential Edema Measurements - multi extremities []  - 0 Nutritional Assessment / Counseling / Intervention []  - 0 Lower Extremity Assessment (monofilament, tuning fork, pulses) []  - 0 Peripheral Arterial Disease Assessment (using hand held doppler) ASSESSMENTS - Ostomy and/or Continence Assessment and Care []  - 0 Incontinence Assessment and Management []  - 0 Ostomy Care Assessment and Management (repouching, etc.) PROCESS - Coordination of Care X - Simple Patient / Family Education for ongoing care 1 15 []  - 0 Complex (extensive) Patient / Family Education for ongoing care X- 1 10 Staff obtains Programmer, systems, Records, T Results / Process Orders est []  - 0 Staff telephones HHA, Nursing Homes / Clarify orders / etc []  - 0 Routine Transfer to another Facility (non-emergent condition) []  - 0 Routine Hospital Admission (non-emergent condition) []  - 0 New Admissions / Biomedical engineer / Ordering NPWT Apligraf, etc. , []  - 0 Emergency Hospital Admission (emergent condition) X- 1 10 Simple Discharge Coordination []  - 0 Complex (extensive) Discharge Coordination PROCESS - Special Needs []  - 0 Pediatric / Minor Patient Management []  - 0 Isolation Patient Management []  - 0 Hearing / Language / Visual special needs []  - 0 Assessment of Community assistance (transportation, D/C planning, etc.) []  - 0 Additional assistance / Altered mentation []  - 0 Support Surface(s)  Assessment (bed, cushion, seat, etc.) INTERVENTIONS - Wound Cleansing / Measurement []  - 0 Simple Wound Cleansing - one wound X- 2 5 Complex Wound Cleansing -  multiple wounds []  - 0 Wound Imaging (photographs - any number of wounds) []  - 0 Wound Tracing (instead of photographs) []  - 0 Simple Wound Measurement - one wound []  - 0 Complex Wound Measurement - multiple wounds INTERVENTIONS - Wound Dressings X - Small Wound Dressing one or multiple wounds 2 10 []  - 0 Medium Wound Dressing one or multiple wounds []  - 0 Large Wound Dressing one or multiple wounds []  - 0 Application of Medications - topical []  - 0 Application of Medications - injection INTERVENTIONS - Miscellaneous []  - 0 External ear exam []  - 0 Specimen Collection (cultures, biopsies, blood, body fluids, etc.) []  - 0 Specimen(s) / Culture(s) sent or taken to Lab for analysis []  - 0 Patient Transfer (multiple staff / Civil Service fast streamer / Similar devices) []  - 0 Simple Staple / Suture removal (25 or less) []  - 0 Complex Staple / Suture removal (26 or more) []  - 0 Hypo / Hyperglycemic Management (close monitor of Blood Glucose) []  - 0 Ankle / Brachial Index (ABI) - do not check if billed separately X- 1 5 Vital Signs Has the patient been seen at the hospital within the last three years: Yes Total Score: 95 Level Of Care: New/Established - Level 3 Electronic Signature(s) Signed: 04/15/2021 4:46:25 PM By: Baruch Gouty RN, BSN Entered By: Baruch Gouty on 04/15/2021 08:42:02 -------------------------------------------------------------------------------- Encounter Discharge Information Details Patient Name: Date of Service: [name], [name] 04/15/2021 8:15 A M Medical Record Number: [id number] Patient Account Number: [id number] Date of Birth/Sex: Treating RN: Sep 28, 1970 (51 y.o. [name] Primary Care Hailie Searight: [name] Other Clinician: Referring Lilas Diefendorf: Treating Zola Runion/Extender: [name], [name] in Treatment: 31 Encounter Discharge Information Items Discharge Condition: Stable Ambulatory Status: Cane Discharge Destination: Home Transportation: Private Auto Accompanied By: self Schedule Follow-up Appointment: Yes Clinical Summary of Care: Patient Declined Electronic Signature(s) Signed: 04/15/2021 4:46:25 PM By: Baruch Gouty RN, BSN Entered By: Baruch Gouty on 04/15/2021 08:43:15 -------------------------------------------------------------------------------- Patient/Caregiver Education Details Patient Name: Date of Service: [name], [place] 1/18/2023andnbsp8:15 A M Medical Record Number: [id number] Patient Account Number: [id number] Date of Birth/Gender: Treating RN: 1970/04/21 (51 y.o. [name] Primary Care Physician: [name] Other Clinician: Referring Physician: Treating Physician/Extender: Bobbye [place] in Treatment: 31 Education Assessment Education Provided To: Patient Education Topics Provided Infection: Methods: Explain/Verbal Responses: Reinforcements needed, State content correctly Wound/Skin Impairment: Methods: Explain/Verbal Responses: Reinforcements needed, State content correctly Electronic Signature(s) Signed: 04/15/2021 4:46:25 PM By: Baruch Gouty RN, BSN Entered By: Baruch Gouty on 04/15/2021 08:42:54 -------------------------------------------------------------------------------- Wound Assessment Details Patient Name: Date of Service: [name], [name] 04/15/2021 8:15 A M Medical Record Number: [id number] Patient Account Number: [id number] Date of Birth/Sex: Treating RN: [birth year] (51 y.o. [name] Primary Care Tramell Piechota: [name] Other Clinician: Referring Brayli Klingbeil: Treating Gedalia Mcmillon/Extender: [name], [name] in Treatment: 31 Wound Status Wound Number: 3 Primary Diabetic Wound/Ulcer of the Lower Extremity Etiology: Wound  Location: Right, Proximal, Lateral Foot Wound Open Wounding Event: Gradually Appeared Status: Date Acquired: 10/29/2020 Comorbid Anemia, Coronary Artery Disease, Hypertension, Type II Weeks Of Treatment: 24 History: Diabetes, Osteomyelitis, Neuropathy Clustered Wound: No Wound Measurements Length: (cm) 0.3 Width: (cm) 0.3 Depth: (cm) 1.3 Area: (cm) 0.071 Volume: (cm) 0.092 % Reduction in Area: 43.7% % Reduction in Volume: -4.5% Epithelialization: None Wound Description Classification: Grade 3 Wound Margin: Well defined, not attached Exudate Amount: Medium Exudate Type: Serosanguineous Exudate Color: red, brown Foul Odor After Cleansing: Yes Due to  Product Use: No Slough/Fibrino No Wound Bed Granulation Amount: Large (67-100%) Exposed Structure Granulation Quality: Pink, Pale Fascia Exposed: No Necrotic Amount: None Present (0%) Fat Layer (Subcutaneous Tissue) Exposed: Yes Tendon Exposed: No Muscle Exposed: No Joint Exposed: No Bone Exposed: No Treatment Notes Wound #3 (Foot) Wound Laterality: Right, Lateral, Proximal Cleanser Soap and Water Discharge Instruction: May shower and wash wound with dial antibacterial soap and water prior to dressing change. Wound Cleanser Discharge Instruction: Cleanse the wound with wound cleanser prior to applying a clean dressing using gauze sponges, not tissue or cotton balls. Peri-Wound Care Topical Primary Dressing KerraCel Ag Gelling Fiber Dressing, 2x2 in (silver alginate) Discharge Instruction: Apply silver alginate to wound bed as instructed Secondary Dressing Woven Gauze Sponge, Non-Sterile 4x4 in Discharge Instruction: Apply over primary dressing as directed. ABD Pad, 5x9 Discharge Instruction: Apply over primary dressing as directed. Secured With Compression Wrap Kerlix Roll 4.5x3.1 (in/yd) Discharge Instruction: Apply Kerlix and Coban compression as directed. Coban Self-Adherent Wrap 4x5 (in/yd) Discharge  Instruction: Apply over Kerlix as directed. Compression Stockings Add-Ons Electronic Signature(s) Signed: 04/15/2021 4:46:25 PM By: Baruch Gouty RN, BSN Entered By: Baruch Gouty on 04/15/2021 08:40:42 -------------------------------------------------------------------------------- Wound Assessment Details Patient Name: Date of Service: [name], [name] 04/15/2021 8:15 A M Medical Record Number: [id number] Patient Account Number: [id number] Date of Birth/Sex: Treating RN: 27-Aug-1970 (51 y.o. [name] Primary Care Rhona Fusilier: [name] Other Clinician: Referring Gustava Berland: Treating Kaysia Willard/Extender: [name], [name] in Treatment: 31 Wound Status Wound Number: 7 Primary Diabetic Wound/Ulcer of the Lower Extremity Etiology: Wound Location: Right, Lateral, Plantar Foot Wound Open Wounding Event: Gradually Appeared Status: Date Acquired: 12/31/2020 Comorbid Anemia, Coronary Artery Disease, Hypertension, Type II Weeks Of Treatment: 15 History: Diabetes, Osteomyelitis, Neuropathy Clustered Wound: No Wound Measurements Length: (cm) 0.3 Width: (cm) 0.3 Depth: (cm) 2.5 Area: (cm) 0.071 Volume: (cm) 0.177 % Reduction in Area: 63.8% % Reduction in Volume: 9.7% Epithelialization: None Wound Description Classification: Grade 3 Wound Margin: Well defined, not attached Exudate Amount: Medium Exudate Type: Serosanguineous Exudate Color: red, brown Foul Odor After Cleansing: Yes Due to Product Use: No Slough/Fibrino No Wound Bed Granulation Amount: Small (1-33%) Exposed Structure Granulation Quality: Red Fascia Exposed: No Necrotic Amount: None Present (0%) Fat Layer (Subcutaneous Tissue) Exposed: Yes Tendon Exposed: No Muscle Exposed: No Joint Exposed: No Bone Exposed: Yes Treatment Notes Wound #7 (Foot) Wound Laterality: Plantar, Right, Lateral Cleanser Soap and Water Discharge Instruction: May shower and wash wound with dial  antibacterial soap and water prior to dressing change. Wound Cleanser Discharge Instruction: Cleanse the wound with wound cleanser prior to applying a clean dressing using gauze sponges, not tissue or cotton balls. Peri-Wound Care Topical Primary Dressing KerraCel Ag Gelling Fiber Dressing, 2x2 in (silver alginate) Discharge Instruction: Apply silver alginate to wound bed as instructed Secondary Dressing Woven Gauze Sponge, Non-Sterile 4x4 in Discharge Instruction: Apply over primary dressing as directed. ABD Pad, 5x9 Discharge Instruction: Apply over primary dressing as directed. Secured With Compression Wrap Kerlix Roll 4.5x3.1 (in/yd) Discharge Instruction: Apply Kerlix and Coban compression as directed. Coban Self-Adherent Wrap 4x5 (in/yd) Discharge Instruction: Apply over Kerlix as directed. Compression Stockings Add-Ons Electronic Signature(s) Signed: 04/15/2021 4:46:25 PM By: Baruch Gouty RN, BSN Entered By: Baruch Gouty on 04/15/2021 08:41:11 -------------------------------------------------------------------------------- [place] Details Patient Name: Date of Service: [name], [name] 04/15/2021 8:15 A M Medical Record Number: [id number] Patient Account Number: [id number] Date of Birth/Sex: Treating RN: [birth year] (51 y.o. [name] Primary Care Else Habermann: Minette Brine,  Amy Other Clinician: Referring Kashay Cavenaugh: Treating Codi Kertz/Extender: [name], [name] in Treatment: 31 Vital Signs Time Taken: 08:07 Temperature (F): 98.7 Height (in): 65 Pulse (bpm): 99 Source: Stated Respiratory Rate (breaths/min): 18 Weight (lbs): 196 Blood Pressure (mmHg): 150/88 Source: Stated Capillary Blood Glucose (mg/dl): 155 Body Mass Index (BMI): 32.6 Reference Range: 80 - 120 mg / dl Notes glucose per pt report this am Electronic Signature(s) Signed: 04/15/2021 4:46:25 PM By: Baruch Gouty RN, BSN Entered By: Baruch Gouty on 04/15/2021  08:09:00

## 2021-04-16 ENCOUNTER — Encounter (HOSPITAL_BASED_OUTPATIENT_CLINIC_OR_DEPARTMENT_OTHER): Payer: 59 | Admitting: Internal Medicine

## 2021-04-16 LAB — GLUCOSE, CAPILLARY
Glucose-Capillary: 219 mg/dL — ABNORMAL HIGH (ref 70–99)
Glucose-Capillary: 205 mg/dL — ABNORMAL HIGH (ref 70–99)

## 2021-04-16 NOTE — Progress Notes (Addendum)
YAVIER, SNIDER ([id number]) Visit Report for 04/16/2021 HBO Details Patient Name: Date of Service: [place] RD, [name] 04/16/2021 9:00 A M Medical Record Number: [id number] Patient Account Number: [id number] Date of Birth/Sex: Treating RN: March 24, 1971 (51 y.o. [name], [name] Primary Care Laurance Heide: [name] Other Clinician: Valeria Batman Referring Rodnisha Blomgren: Treating Mystic Labo/Extender: [name], [name] in Treatment: 31 HBO Treatment Course Details Treatment Course Number: 1 Ordering Jood Retana: [name] Treatments Ordered: otal 60 HBO Treatment Start 12/08/2020 Date: HBO Indication: Diabetic Ulcer(s) of the Lower Extremity HBO Treatment End 04/16/2021 Date: HBO Discharge Treatment Series Incomplete; Medical Event- Outcome: Unrelated to HBO HBO Treatment Details Treatment Number: 56 Patient Type: Outpatient Chamber Type: Monoplace Chamber Serial #: [id number] Treatment Protocol: 2.5 ATA with 90 minutes oxygen, with two 5 minute air breaks Treatment Details Compression Rate Down: 2.0 psi / minute De-Compression Rate Up: 2.0 psi / minute A breaks and breathing ir Compress Tx Pressure periods Decompress Decompress Begins Reached (leave unused spaces Begins Ends blank) Chamber Pressure (ATA 1 2.5 2.5 2.5 2.5 2.5 - - 2.5 1 ) Clock Time (24 hr) 09:17 09:30 10:00 10:05 10:35 10:40 - - 11:10 11:21 Treatment Length: 124 (minutes) Treatment Segments: 4 Vital Signs Capillary Blood Glucose Reference Range: 80 - 120 mg / dl HBO Diabetic Blood Glucose Intervention Range: <131 mg/dl or >249 mg/dl Time Vitals Blood Respiratory Capillary Blood Glucose Pulse Action Type: Pulse: Temperature: Taken: Pressure: Rate: Glucose (mg/dl): Meter #: Oximetry (%) Taken: Pre 09:06 136/82 91 16 98.2 219 Post 11:25 130/77 86 16 98.3 205 Treatment Response Treatment Toleration: Well Treatment Completion Status: Treatment Completed without Adverse Event Treatment  Notes Informed patient to let me know, if he had any problem with his ears at any point. Information entered from paper in-take sheet. Additional Procedure Documentation Tissue Sevierity: Bone involvement without necrosis Shaunte Weissinger Notes No concerns with treatment given Physician HBO Attestation: I certify that I supervised this HBO treatment in accordance with Medicare guidelines. A trained emergency response team is readily available per Yes hospital policies and procedures. Continue HBOT as ordered. Yes Electronic Signature(s) Signed: 04/22/2021 9:53:19 AM By: [name] [name] , , Signed: 04/22/2021 4:28:28 PM By: [name] MD Previous Signature: 04/22/2021 7:57:20 AM Version By: [name] [name] , , Previous Signature: 04/16/2021 4:44:32 PM Version By: [name] MD Previous Signature: 04/16/2021 2:43:04 PM Version By: Valeria Batman EMT Entered By: [name] on 04/22/2021 09:53:18 -------------------------------------------------------------------------------- HBO Safety Checklist Details Patient Name: Date of Service: [name], [place] [name] 04/16/2021 9:00 A M Medical Record Number: [id number] Patient Account Number: [id number] Date of Birth/Sex: Treating RN: [birth year] (51 y.o. [name], Meta.Reding Primary Care Neenah Canter: [name] Other Clinician: Valeria Batman Referring Helene Bernstein: Treating Tamu Golz/Extender: [name], [name] in Treatment: 31 HBO Safety Checklist Items Safety Checklist Consent Form Signed Patient voided / foley secured and emptied When did you last eato 0800 Last dose of injectable or oral agent 0730 Ostomy pouch emptied and vented if applicable NA All implantable devices assessed, documented and approved NA Intravenous access site secured and place NA Valuables secured Linens and cotton and cotton/polyester blend (less than 51% polyester) Personal oil-based products / skin lotions / body  lotions removed Wigs or hairpieces removed NA Smoking or tobacco materials removed Books / newspapers / magazines / loose paper removed Cologne, aftershave, perfume and deodorant removed Jewelry removed (may wrap wedding band) NA Make-up removed NA Hair care products removed NA Battery  operated devices (external) removed Heating patches and chemical warmers removed Titanium eyewear removed NA Nail polish cured greater than 10 hours NA Casting material cured greater than 10 hours NA Hearing aids removed NA Loose dentures or partials removed NA Prosthetics have been removed NA Patient demonstrates correct use of air break device (if applicable) Patient concerns have been addressed Patient grounding bracelet on and cord attached to chamber Specifics for Inpatients (complete in addition to above) Medication sheet sent with patient NA Intravenous medications needed or due during therapy sent with patient NA Drainage tubes (e.g. nasogastric tube or chest tube secured and vented) NA Endotracheal or Tracheotomy tube secured NA Cuff deflated of air and inflated with saline NA Airway suctioned NA Notes Information entered from paper in-take sheet. Electronic Signature(s) Signed: 04/16/2021 2:34:01 PM By: Valeria Batman EMT Entered By: Valeria Batman on 04/16/2021 14:34:00

## 2021-04-16 NOTE — Progress Notes (Signed)
[name], KESSENICH ([id number]) Visit Report for 04/16/2021 Problem List Details Patient Name: Date of Service: [place] [address], [name] 04/16/2021 9:00 A M Medical Record Number: [id number] Patient Account Number: [id number] Date of Birth/Sex: Treating RN: Sep 08, 1970 (51 y.o. [name], Meta.Reding Primary Care Provider: [name] Other Clinician: Valeria Batman Referring Provider: Treating Provider/Extender: [name], [name] in Treatment: 31 Active Problems ICD-10 Encounter Code Description Active Date MDM Diagnosis [phone] Other chronic osteomyelitis, right ankle and foot 09/05/2020 No Yes E11.621 Type 2 diabetes mellitus with foot ulcer 09/05/2020 No Yes L97.514 Non-pressure chronic ulcer of other part of right foot with necrosis of bone 09/05/2020 No Yes T81.31XS Disruption of external operation (surgical) wound, not elsewhere classified, 09/05/2020 No Yes sequela F17.218 Nicotine dependence, cigarettes, with other nicotine-induced disorders 11/26/2020 No Yes L97.522 Non-pressure chronic ulcer of other part of left foot with fat layer exposed 10/22/2020 No Yes L84 Corns and callosities 01/28/2021 No Yes Inactive Problems Resolved Problems Electronic Signature(s) Signed: 04/16/2021 2:44:06 PM By: Valeria Batman EMT Signed: 04/16/2021 4:44:32 PM By: [name] MD Entered By: Valeria Batman on 04/16/2021 14:44:06 -------------------------------------------------------------------------------- SuperBill Details Patient Name: Date of Service: [name], [name] 04/16/2021 Medical Record Number: [id number] Patient Account Number: [id number] Date of Birth/Sex: Treating RN: [birth year] (51 y.o. [name], Meta.Reding Primary Care Provider: [name] Other Clinician: Valeria Batman Referring Provider: Treating Provider/Extender: [name], [name] in Treatment: 31 Diagnosis Coding ICD-10 Codes Code Description [phone] Other chronic osteomyelitis, right  ankle and foot E11.621 Type 2 diabetes mellitus with foot ulcer L97.514 Non-pressure chronic ulcer of other part of right foot with necrosis of bone T81.31XS Disruption of external operation (surgical) wound, not elsewhere classified, sequela F17.218 Nicotine dependence, cigarettes, with other nicotine-induced disorders L97.522 Non-pressure chronic ulcer of other part of left foot with fat layer exposed L84 Corns and callosities Facility Procedures CPT4 Code: [id number] Description: G0277-(Facility Use Only) HBOT full body chamber, 68min , ICD-10 Diagnosis Description M86.671 Other chronic osteomyelitis, right ankle and foot E11.621 Type 2 diabetes mellitus with foot ulcer L97.514 Non-pressure chronic ulcer of other  part of right foot with necrosis of bone T81.31XS Disruption of external operation (surgical) wound, not elsewhere classified, seque Modifier: la Quantity: 4 Physician Procedures : CPT4 Code Description Modifier [id number] - WC PHYS HYPERBARIC OXYGEN THERAPY ICD-10 Diagnosis Description M86.671 Other chronic osteomyelitis, right ankle and foot E11.621 Type 2 diabetes mellitus with foot ulcer L97.514 Non-pressure chronic ulcer  of other part of right foot with necrosis of bone T81.31XS Disruption of external operation (surgical) wound, not elsewhere classified, sequela Quantity: 1 Electronic Signature(s) Signed: 04/16/2021 2:43:57 PM By: Valeria Batman EMT Signed: 04/16/2021 4:44:32 PM By: [name] MD Entered By: Valeria Batman on 04/16/2021 14:43:54

## 2021-04-17 ENCOUNTER — Encounter (HOSPITAL_BASED_OUTPATIENT_CLINIC_OR_DEPARTMENT_OTHER): Payer: 59 | Admitting: Internal Medicine

## 2021-04-17 ENCOUNTER — Other Ambulatory Visit: Payer: Self-pay

## 2021-04-17 ENCOUNTER — Other Ambulatory Visit: Payer: Self-pay | Admitting: Podiatry

## 2021-04-17 ENCOUNTER — Inpatient Hospital Stay (HOSPITAL_COMMUNITY)
Admission: EM | Admit: 2021-04-17 | Discharge: 2021-04-28 | DRG: 853 | Disposition: A | Discharge status: Home Health | Payer: 59 | Attending: Family Medicine | Admitting: Family Medicine

## 2021-04-17 ENCOUNTER — Ambulatory Visit (INDEPENDENT_AMBULATORY_CARE_PROVIDER_SITE_OTHER): Payer: 59 | Admitting: Podiatry

## 2021-04-17 ENCOUNTER — Encounter (HOSPITAL_COMMUNITY): Payer: Self-pay

## 2021-04-17 DIAGNOSIS — Z7982 Long term (current) use of aspirin: Secondary | ICD-10-CM

## 2021-04-17 DIAGNOSIS — E1142 Type 2 diabetes mellitus with diabetic polyneuropathy: Secondary | ICD-10-CM | POA: Diagnosis not present

## 2021-04-17 DIAGNOSIS — E871 Hypo-osmolality and hyponatremia: Secondary | ICD-10-CM | POA: Diagnosis not present

## 2021-04-17 DIAGNOSIS — Z79899 Other long term (current) drug therapy: Secondary | ICD-10-CM

## 2021-04-17 DIAGNOSIS — A419 Sepsis, unspecified organism: Secondary | ICD-10-CM | POA: Diagnosis not present

## 2021-04-17 DIAGNOSIS — M86671 Other chronic osteomyelitis, right ankle and foot: Secondary | ICD-10-CM | POA: Diagnosis present

## 2021-04-17 DIAGNOSIS — L089 Local infection of the skin and subcutaneous tissue, unspecified: Secondary | ICD-10-CM | POA: Diagnosis present

## 2021-04-17 DIAGNOSIS — R627 Adult failure to thrive: Secondary | ICD-10-CM | POA: Diagnosis present

## 2021-04-17 DIAGNOSIS — N400 Enlarged prostate without lower urinary tract symptoms: Secondary | ICD-10-CM | POA: Diagnosis present

## 2021-04-17 DIAGNOSIS — F17210 Nicotine dependence, cigarettes, uncomplicated: Secondary | ICD-10-CM | POA: Diagnosis present

## 2021-04-17 DIAGNOSIS — E1140 Type 2 diabetes mellitus with diabetic neuropathy, unspecified: Secondary | ICD-10-CM | POA: Diagnosis present

## 2021-04-17 DIAGNOSIS — M86171 Other acute osteomyelitis, right ankle and foot: Secondary | ICD-10-CM

## 2021-04-17 DIAGNOSIS — E1165 Type 2 diabetes mellitus with hyperglycemia: Secondary | ICD-10-CM | POA: Diagnosis not present

## 2021-04-17 DIAGNOSIS — D72829 Elevated white blood cell count, unspecified: Secondary | ICD-10-CM | POA: Diagnosis present

## 2021-04-17 DIAGNOSIS — N1832 Chronic kidney disease, stage 3b: Secondary | ICD-10-CM | POA: Diagnosis present

## 2021-04-17 DIAGNOSIS — Z89431 Acquired absence of right foot: Secondary | ICD-10-CM

## 2021-04-17 DIAGNOSIS — I330 Acute and subacute infective endocarditis: Secondary | ICD-10-CM | POA: Diagnosis not present

## 2021-04-17 DIAGNOSIS — I10 Essential (primary) hypertension: Secondary | ICD-10-CM

## 2021-04-17 DIAGNOSIS — E11628 Type 2 diabetes mellitus with other skin complications: Secondary | ICD-10-CM | POA: Diagnosis present

## 2021-04-17 DIAGNOSIS — E1122 Type 2 diabetes mellitus with diabetic chronic kidney disease: Secondary | ICD-10-CM | POA: Diagnosis present

## 2021-04-17 DIAGNOSIS — M869 Osteomyelitis, unspecified: Secondary | ICD-10-CM | POA: Diagnosis not present

## 2021-04-17 DIAGNOSIS — I700 Atherosclerosis of aorta: Secondary | ICD-10-CM | POA: Diagnosis present

## 2021-04-17 DIAGNOSIS — I130 Hypertensive heart and chronic kidney disease with heart failure and stage 1 through stage 4 chronic kidney disease, or unspecified chronic kidney disease: Secondary | ICD-10-CM | POA: Diagnosis present

## 2021-04-17 DIAGNOSIS — B9561 Methicillin susceptible Staphylococcus aureus infection as the cause of diseases classified elsewhere: Secondary | ICD-10-CM

## 2021-04-17 DIAGNOSIS — I2510 Atherosclerotic heart disease of native coronary artery without angina pectoris: Secondary | ICD-10-CM | POA: Diagnosis present

## 2021-04-17 DIAGNOSIS — Z89412 Acquired absence of left great toe: Secondary | ICD-10-CM

## 2021-04-17 DIAGNOSIS — E1169 Type 2 diabetes mellitus with other specified complication: Secondary | ICD-10-CM | POA: Diagnosis present

## 2021-04-17 DIAGNOSIS — N189 Chronic kidney disease, unspecified: Secondary | ICD-10-CM | POA: Diagnosis not present

## 2021-04-17 DIAGNOSIS — M8580 Other specified disorders of bone density and structure, unspecified site: Secondary | ICD-10-CM

## 2021-04-17 DIAGNOSIS — L02611 Cutaneous abscess of right foot: Secondary | ICD-10-CM | POA: Diagnosis present

## 2021-04-17 DIAGNOSIS — Z20822 Contact with and (suspected) exposure to covid-19: Secondary | ICD-10-CM | POA: Diagnosis present

## 2021-04-17 DIAGNOSIS — R6520 Severe sepsis without septic shock: Secondary | ICD-10-CM | POA: Diagnosis present

## 2021-04-17 DIAGNOSIS — I5032 Chronic diastolic (congestive) heart failure: Secondary | ICD-10-CM | POA: Diagnosis present

## 2021-04-17 DIAGNOSIS — R7881 Bacteremia: Principal | ICD-10-CM

## 2021-04-17 DIAGNOSIS — Z801 Family history of malignant neoplasm of trachea, bronchus and lung: Secondary | ICD-10-CM

## 2021-04-17 DIAGNOSIS — Z6833 Body mass index (BMI) 33.0-33.9, adult: Secondary | ICD-10-CM

## 2021-04-17 DIAGNOSIS — L03115 Cellulitis of right lower limb: Secondary | ICD-10-CM | POA: Diagnosis present

## 2021-04-17 DIAGNOSIS — L97516 Non-pressure chronic ulcer of other part of right foot with bone involvement without evidence of necrosis: Secondary | ICD-10-CM | POA: Diagnosis present

## 2021-04-17 DIAGNOSIS — Z8249 Family history of ischemic heart disease and other diseases of the circulatory system: Secondary | ICD-10-CM

## 2021-04-17 DIAGNOSIS — Z833 Family history of diabetes mellitus: Secondary | ICD-10-CM

## 2021-04-17 DIAGNOSIS — E11649 Type 2 diabetes mellitus with hypoglycemia without coma: Secondary | ICD-10-CM | POA: Diagnosis not present

## 2021-04-17 DIAGNOSIS — A4101 Sepsis due to Methicillin susceptible Staphylococcus aureus: Secondary | ICD-10-CM | POA: Diagnosis present

## 2021-04-17 DIAGNOSIS — D631 Anemia in chronic kidney disease: Secondary | ICD-10-CM | POA: Diagnosis present

## 2021-04-17 DIAGNOSIS — Z880 Allergy status to penicillin: Secondary | ICD-10-CM

## 2021-04-17 DIAGNOSIS — Z888 Allergy status to other drugs, medicaments and biological substances status: Secondary | ICD-10-CM

## 2021-04-17 DIAGNOSIS — E782 Mixed hyperlipidemia: Secondary | ICD-10-CM | POA: Diagnosis present

## 2021-04-17 DIAGNOSIS — E11621 Type 2 diabetes mellitus with foot ulcer: Secondary | ICD-10-CM | POA: Diagnosis present

## 2021-04-17 DIAGNOSIS — I3139 Other pericardial effusion (noninflammatory): Secondary | ICD-10-CM | POA: Diagnosis not present

## 2021-04-17 DIAGNOSIS — Z794 Long term (current) use of insulin: Secondary | ICD-10-CM

## 2021-04-17 DIAGNOSIS — E669 Obesity, unspecified: Secondary | ICD-10-CM | POA: Diagnosis present

## 2021-04-17 DIAGNOSIS — I059 Rheumatic mitral valve disease, unspecified: Secondary | ICD-10-CM | POA: Diagnosis not present

## 2021-04-17 DIAGNOSIS — N179 Acute kidney failure, unspecified: Secondary | ICD-10-CM | POA: Diagnosis present

## 2021-04-17 DIAGNOSIS — K5900 Constipation, unspecified: Secondary | ICD-10-CM | POA: Diagnosis not present

## 2021-04-17 LAB — CBC WITH DIFFERENTIAL/PLATELET
Abs Immature Granulocytes: 0.09 10*3/uL — ABNORMAL HIGH (ref 0.00–0.07)
Basophils Absolute: 0 10*3/uL (ref 0.0–0.1)
Basophils Relative: 0 %
Eosinophils Absolute: 0.3 10*3/uL (ref 0.0–0.5)
Eosinophils Relative: 2 %
HCT: 29.7 % — ABNORMAL LOW (ref 39.0–52.0)
Hemoglobin: 9.4 g/dL — ABNORMAL LOW (ref 13.0–17.0)
Immature Granulocytes: 1 %
Lymphocytes Relative: 15 %
Lymphs Abs: 2.3 10*3/uL (ref 0.7–4.0)
MCH: 27.6 pg (ref 26.0–34.0)
MCHC: 31.6 g/dL (ref 30.0–36.0)
MCV: 87.4 fL (ref 80.0–100.0)
Monocytes Absolute: 0.9 10*3/uL (ref 0.1–1.0)
Monocytes Relative: 6 %
Neutro Abs: 11.4 10*3/uL — ABNORMAL HIGH (ref 1.7–7.7)
Neutrophils Relative %: 76 %
Platelets: 334 10*3/uL (ref 150–400)
RBC: 3.4 MIL/uL — ABNORMAL LOW (ref 4.22–5.81)
RDW: 14.3 % (ref 11.5–15.5)
WBC: 15.2 10*3/uL — ABNORMAL HIGH (ref 4.0–10.5)
nRBC: 0 % (ref 0.0–0.2)

## 2021-04-17 LAB — BASIC METABOLIC PANEL
Anion gap: 10 (ref 5–15)
BUN: 30 mg/dL — ABNORMAL HIGH (ref 6–20)
CO2: 21 mmol/L — ABNORMAL LOW (ref 22–32)
Calcium: 8.6 mg/dL — ABNORMAL LOW (ref 8.9–10.3)
Chloride: 104 mmol/L (ref 98–111)
Creatinine, Ser: 3.21 mg/dL — ABNORMAL HIGH (ref 0.61–1.24)
GFR, Estimated: 22 mL/min — ABNORMAL LOW (ref >60)
Glucose, Bld: 183 mg/dL — ABNORMAL HIGH (ref 70–99)
Potassium: 4.1 mmol/L (ref 3.5–5.1)
Sodium: 135 mmol/L (ref 135–145)

## 2021-04-17 LAB — RESP PANEL BY RT-PCR (FLU A&B, COVID) ARPGX2
Influenza A by PCR: NEGATIVE
Influenza B by PCR: NEGATIVE
SARS Coronavirus 2 by RT PCR: NEGATIVE

## 2021-04-17 MED ORDER — ASPIRIN EC 81 MG PO TBEC
81.0000 mg | DELAYED_RELEASE_TABLET | Freq: Every morning | ORAL | Status: DC
  Administered 2021-04-20 – 2021-04-26 (×6): 81 mg via ORAL
  Filled 2021-04-17 (×5): qty 1

## 2021-04-17 MED ORDER — VANCOMYCIN HCL 2000 MG/400ML IV SOLN
2000.0000 mg | Freq: Once | INTRAVENOUS | Status: AC
  Administered 2021-04-17: 2000 mg via INTRAVENOUS
  Filled 2021-04-17: qty 400

## 2021-04-17 MED ORDER — ATORVASTATIN CALCIUM 20 MG PO TABS
20.0000 mg | ORAL_TABLET | Freq: Every day | ORAL | Status: DC
  Administered 2021-04-18 – 2021-04-28 (×9): 20 mg via ORAL
  Filled 2021-04-17 (×9): qty 1

## 2021-04-17 MED ORDER — AMLODIPINE BESYLATE 10 MG PO TABS
10.0000 mg | ORAL_TABLET | Freq: Every day | ORAL | Status: DC
  Administered 2021-04-18 – 2021-04-28 (×9): 10 mg via ORAL
  Filled 2021-04-17 (×9): qty 1

## 2021-04-17 MED ORDER — CLONIDINE HCL 0.1 MG PO TABS
0.1000 mg | ORAL_TABLET | Freq: Two times a day (BID) | ORAL | Status: DC
Start: 2021-04-18 — End: 2021-04-29
  Administered 2021-04-18 – 2021-04-28 (×19): 0.1 mg via ORAL
  Filled 2021-04-17 (×19): qty 1

## 2021-04-17 MED ORDER — GABAPENTIN 300 MG PO CAPS
300.0000 mg | ORAL_CAPSULE | Freq: Three times a day (TID) | ORAL | Status: DC
  Administered 2021-04-18 – 2021-04-20 (×6): 300 mg via ORAL
  Filled 2021-04-17 (×8): qty 1

## 2021-04-17 MED ORDER — CARVEDILOL 25 MG PO TABS
25.0000 mg | ORAL_TABLET | Freq: Two times a day (BID) | ORAL | Status: DC
  Administered 2021-04-18 – 2021-04-28 (×20): 25 mg via ORAL
  Filled 2021-04-17 (×20): qty 1

## 2021-04-17 MED ORDER — SODIUM CHLORIDE 0.9 % IV SOLN: INTRAVENOUS | Status: AC

## 2021-04-17 MED ORDER — FERROUS SULFATE 325 (65 FE) MG PO TABS
325.0000 mg | ORAL_TABLET | Freq: Every day | ORAL | Status: DC
  Administered 2021-04-18 – 2021-04-26 (×7): 325 mg via ORAL
  Filled 2021-04-17 (×7): qty 1

## 2021-04-17 MED ORDER — HYDROMORPHONE HCL 1 MG/ML IJ SOLN
0.5000 mg | INTRAMUSCULAR | Status: DC | PRN
  Administered 2021-04-19 – 2021-04-23 (×8): 0.5 mg via INTRAVENOUS
  Filled 2021-04-17 (×9): qty 0.5

## 2021-04-17 MED ORDER — VANCOMYCIN HCL 1500 MG/300ML IV SOLN: 1500.0000 mg | INTRAVENOUS | Status: DC

## 2021-04-17 MED ORDER — INSULIN ASPART 100 UNIT/ML IJ SOLN
0.0000 [IU] | INTRAMUSCULAR | Status: DC
  Administered 2021-04-18 (×2): 1 [IU] via SUBCUTANEOUS
  Administered 2021-04-19: 2 [IU] via SUBCUTANEOUS
  Administered 2021-04-19 – 2021-04-21 (×5): 1 [IU] via SUBCUTANEOUS
  Administered 2021-04-21: 17:00:00 2 [IU] via SUBCUTANEOUS
  Administered 2021-04-21 – 2021-04-22 (×4): 1 [IU] via SUBCUTANEOUS
  Administered 2021-04-22: 21:00:00 2 [IU] via SUBCUTANEOUS
  Administered 2021-04-23 (×2): 1 [IU] via SUBCUTANEOUS

## 2021-04-17 NOTE — ED Provider Notes (Signed)
[facility] DEPT Provider Note   CSN: [id number] Arrival date & time: 04/17/21  1633     History  Chief Complaint  Patient presents with   Recurrent Skin Infections    [name] is a 51 y.o. male.  51 year old male with history of chronic kidney disease, diabetes who presents for admission.  Patient saw his podiatrist today, Dr. March Rummage, for an infection to his right foot.  Dr. March Rummage feels the patient needs to have surgery tomorrow and sent him here for admission for that.  Patient denies any fevers or chills.  Has not had any emesis.  Has no complaints      Home Medications Prior to Admission medications   Medication Sig Start Date End Date Taking? Authorizing Provider  Accu-Chek FastClix Lancets MISC 1 Package by Does not apply route as directed. Use as instructed to test blood sugar 2 times daily E11.65 09/23/20   Gildardo Pounds, NP  ACETAMINOPHEN EXTRA STRENGTH 500 MG tablet TAKE 2 TABLETS EVERY 6 HOURS AS NEEDED FOR PAIN 01/30/21   Dorna Mai, MD  amLODipine (NORVASC) 10 MG tablet Take 1 tablet (10 mg total) by mouth daily. 04/02/21   Camillia Herter, NP  ASPIRIN ADULT LOW STRENGTH 81 MG EC tablet TAKE 1 TABLET BY MOUTH EVERY MORNING 01/30/21   Dorna Mai, MD  atorvastatin (LIPITOR) 20 MG tablet Take 1 tablet (20 mg total) by mouth daily. 03/16/21 06/14/21  Camillia Herter, NP  b complex vitamins tablet Take 1 tablet by mouth daily. 10/18/18   Plotnikov, Evie Lacks, MD  B Complex-C (B-COMPLEX WITH VITAMIN C) tablet TAKE 1 TABLET EVERY MORNING 01/30/21   Dorna Mai, MD  blood glucose meter kit and supplies KIT Dispense based on patient and insurance preference. Use up to four times daily as directed. (FOR ICD-9 250.00, 250.01). 08/25/16   Minus Liberty, MD  Blood Pressure Monitor DEVI Please provide patient with insurance approved blood pressure monitor. I10.0 10/05/19   Gildardo Pounds, NP  carvedilol (COREG) 25 MG tablet Take 1  tablet (25 mg total) by mouth 2 (two) times daily. 03/16/21 09/12/21  Camillia Herter, NP  cloNIDine (CATAPRES) 0.1 MG tablet TAKE 1 TABLET BY MOUTH TWICE  DAILY 03/17/21   Charlott Rakes, MD  Continuous Blood Gluc Receiver (FREESTYLE LIBRE 2 READER) DEVI Monitor blood glucose levels 4-5 times per day. ICD10 E11.42 Z79.4 11/04/20   Charlott Rakes, MD  Continuous Blood Gluc Sensor (FREESTYLE LIBRE 2 SENSOR) MISC Monitor blood glucose levels 4-5 times per day. ICD10 E11.42 Z79.4 11/04/20   Charlott Rakes, MD  Dulaglutide 3 MG/0.5ML SOPN Inject 3 mg into the skin once a week. 04/02/21 07/01/21  Camillia Herter, NP  escitalopram (LEXAPRO) 20 MG tablet Take 1 tablet (20 mg total) by mouth daily. 01/27/21 02/26/21  Camillia Herter, NP  ferrous sulfate 325 (65 FE) MG EC tablet TAKE 1 TABLET BY MOUTH EVERY MORNING Patient not taking: Reported on 03/03/2021 01/30/21   Dorna Mai, MD  ferrous sulfate 325 (65 FE) MG tablet Take 1 tablet (325 mg total) by mouth daily. 09/23/20 03/22/21  Gildardo Pounds, NP  gabapentin (NEURONTIN) 300 MG capsule Take 1 capsule (300 mg total) by mouth 3 (three) times daily. Patient needs office visit before refills will be given 09/23/20   Gildardo Pounds, NP  glucose blood test strip Use as instructed to test blood sugar 2 times daily E11.65 09/23/20   Gildardo Pounds, NP  HYDROcodone-acetaminophen (  NORCO/VICODIN) 5-325 MG tablet Take 1 tablet by mouth every 4 (four) hours as needed for moderate pain. Patient not taking: Reported on 03/03/2021 07/29/20 07/29/21  [name], DPM  insulin glargine (LANTUS SOLOSTAR) 100 UNIT/ML Solostar Pen INJECT SUBCUTANEOUSLY 15 UNITS  TWICE DAILY 04/02/21   Camillia Herter, NP  insulin glargine-yfgn (SEMGLEE) 100 UNIT/ML Pen Inject into the skin. 04/02/21   [provider]  insulin lispro (HUMALOG KWIKPEN) 100 UNIT/ML KwikPen For blood sugars 0-150 give 0 units of insulin, 151-200 give 2 units of insulin, 201-250 give 4 units, 251-300 give 6  units, 301-350 give 8 units, 351-400 give 10 units,> 400 give 12 units and call M.D. Discussed hypoglycemia protocol. 04/02/21   Camillia Herter, NP  Insulin Pen Needle 32G X 4 MM MISC Use as instructed. Inject into the skin three time daily. 03/16/21   Camillia Herter, NP  lisinopril (ZESTRIL) 2.5 MG tablet Take 1 tablet (2.5 mg total) by mouth daily. 11/26/20 02/24/21  Gildardo Pounds, NP  Misc. Devices MISC Please provide patient with insurance approved diabetic shoes. E11.65 01/14/20   Gildardo Pounds, NP  NUZYRA 150 MG TABS Take 2 tablets by mouth daily. 03/05/21   [provider]  ofloxacin (OCUFLOX) 0.3 % ophthalmic solution SMARTSIG:In Ear(s) 03/18/21   [provider]  polyethylene glycol (MIRALAX / GLYCOLAX) 17 g packet Take 17 g by mouth daily as needed.    [provider]  polyethylene glycol powder (GLYCOLAX/MIRALAX) 17 GM/SCOOP powder DISSOLVE 1 CAPFUL (17G) IN 8OZ OF LIQUID AND DRINK BY MOUTH AS NEEDED FOR CONSTIPATION 01/30/21   Dorna Mai, MD  senna (SENOKOT) 8.6 MG TABS tablet TAKE 1 TABLET BY MOUTH DAILY AS NEEDED FOR CONSTIPATION 01/30/21   Dorna Mai, MD  tizanidine (ZANAFLEX) 2 MG capsule Take 1 capsule (2 mg total) by mouth 3 (three) times daily. Patient not taking: Reported on 03/03/2021 09/23/20   Gildardo Pounds, NP  triamcinolone cream (KENALOG) 0.1 % Apply 1 application topically 2 (two) times daily. 03/16/21   Camillia Herter, NP  cetirizine (ZYRTEC) 10 MG tablet Take 1 tablet (10 mg total) by mouth daily. Prn itching Patient not taking: Reported on 03/25/2020 11/01/19 03/25/20  Argentina Donovan, PA-C      Allergies    Bee venom, Penicillins, and Clonidine derivatives    Review of Systems   Review of Systems  All other systems reviewed and are negative.  Physical Exam Updated Vital Signs BP (!) 160/92 (BP Location: Left Arm)    Pulse 96    Temp 98.2 F (36.8 C) (Oral)    Resp 16    Ht 1.626 m (_0 )    Wt 88.9 kg    SpO2 100%    BMI  33.64 kg/m  Physical Exam Vitals and nursing note reviewed.  Constitutional:      General: He is not in acute distress.    Appearance: Normal appearance. He is well-developed. He is not toxic-appearing.  HENT:     Head: Normocephalic and atraumatic.  Eyes:     General: Lids are normal.     Conjunctiva/sclera: Conjunctivae normal.     Pupils: Pupils are equal, round, and reactive to light.  Neck:     Thyroid: No thyroid mass.     Trachea: No tracheal deviation.  Cardiovascular:     Rate and Rhythm: Normal rate and regular rhythm.     Heart sounds: Normal heart sounds. No murmur heard.   No  gallop.  Pulmonary:     Effort: Pulmonary effort is normal. No respiratory distress.     Breath sounds: Normal breath sounds. No stridor. No decreased breath sounds, wheezing, rhonchi or rales.  Abdominal:     General: There is no distension.     Palpations: Abdomen is soft.     Tenderness: There is no abdominal tenderness. There is no rebound.  Musculoskeletal:        General: No tenderness. Normal range of motion.     Cervical back: Normal range of motion and neck supple.       Legs:  Skin:    General: Skin is warm and dry.     Findings: No abrasion or rash.  Neurological:     Mental Status: He is alert and oriented to person, place, and time. Mental status is at baseline.     GCS: GCS eye subscore is 4. GCS verbal subscore is 5. GCS motor subscore is 6.     Cranial Nerves: No cranial nerve deficit.     Sensory: No sensory deficit.     Motor: Motor function is intact.  Psychiatric:        Attention and Perception: Attention normal.        Speech: Speech normal.        Behavior: Behavior normal.    ED Results / Procedures / Treatments   Labs (all labs ordered are listed, but only abnormal results are displayed) Labs Reviewed  BASIC METABOLIC PANEL - Abnormal; Notable for the following components:      Result Value   CO2 21 (*)    Glucose, Bld 183 (*)    BUN 30 (*)     Creatinine, Ser 3.21 (*)    Calcium 8.6 (*)    GFR, Estimated 22 (*)    All other components within normal limits  CBC WITH DIFFERENTIAL/PLATELET - Abnormal; Notable for the following components:   WBC 15.2 (*)    RBC 3.40 (*)    Hemoglobin 9.4 (*)    HCT 29.7 (*)    Neutro Abs 11.4 (*)    Abs Immature Granulocytes 0.09 (*)    All other components within normal limits  RESP PANEL BY RT-PCR (FLU A&B, COVID) ARPGX2  CULTURE, BLOOD (ROUTINE X 2)  CULTURE, BLOOD (ROUTINE X 2)    EKG None  Radiology No results found.  Procedures Procedures    Medications Ordered in ED Medications - No data to display  ED Course/ Medical Decision Making/ A&P                           Medical Decision Making Old records reviewed.  Patient has mild leukocytosis with a white count of 15,000.  Indicating some infection.  Blood cultures obtained.  Labs show chronic knee disease.  Patient to be admitted to the hospitalist service for surgery tomorrow.        Final Clinical Impression(s) / ED Diagnoses Final diagnoses:  None    Rx / DC Orders ED Discharge Orders     None         Lacretia Leigh, MD 04/17/21 [phone]

## 2021-04-17 NOTE — ED Triage Notes (Signed)
Patient reports that he was at the [facility] and was sent to the ED for a partial right foot amputation for tomorrow.

## 2021-04-17 NOTE — ED Provider Triage Note (Signed)
Emergency Medicine Provider Triage Evaluation Note  [name] , a 51 y.o. male  was evaluated in triage.  Pt complains of foot wound.  Patient sent from Dr. March Rummage with podiatry for admission to the hospital with plan for partial right foot amputation tomorrow.  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  He is not currently on antibiotics, has had drainage from the foot wound but denies fevers or chills  Review of Systems  Positive: Foot wound Negative: Fevers or chills  Physical Exam  BP (!) 160/92 (BP Location: Left Arm)    Pulse 96    Temp 98.2 F (36.8 C) (Oral)    Resp 16    Ht 5\' 4"  (1.626 m)    Wt 88.9 kg    SpO2 100%    BMI 33.64 kg/m  Gen:   Awake, no distress   Resp:  Normal effort  MSK:   Moves extremities without difficulty  Other:  Wound to the right foot with dressing intact  Medical Decision Making  Medically screening exam initiated at 4:47 PM.  Appropriate orders placed.  Lester Crickenberger Klinker was informed that the remainder of the evaluation will be completed by another provider, this initial triage assessment does not replace that evaluation, and the importance of remaining in the ED until their evaluation is complete.  Screening labs and blood cultures ordered for wound infection, patient has planned amputation tomorrow, will leave dressing intact until patient gets back to her room   Jacqlyn Larsen, [place] 04/17/21 1715

## 2021-04-17 NOTE — Progress Notes (Signed)
HODGE, STACHNIK ([id number]) Visit Report for 04/16/2021 Arrival Information Details Patient Name: Date of Service: [place] RD, [name] 04/16/2021 9:00 A M Medical Record Number: [id number] Patient Account Number: [id number] Date of Birth/Sex: Treating RN: 06/21/1970 (51 y.o. [name], Meta.Reding Primary Care Remingtyn Depaola: [name] Other Clinician: Valeria Batman Referring Janah Mcculloh: Treating Ikea Demicco/Extender: [name], [name] in Treatment: 52 Visit Information History Since Last Visit All ordered tests and consults were completed: Yes Patient Arrived: Cane Added or deleted any medications: No Arrival Time: 08:58 Any new allergies or adverse reactions: No Accompanied By: None Had a fall or experienced change in No Transfer Assistance: None activities of daily living that may affect Patient Identification Verified: Yes risk of falls: Secondary Verification Process Completed: Yes Signs or symptoms of abuse/neglect since last visito No Patient Requires Transmission-Based Precautions: No Hospitalized since last visit: No Patient Has Alerts: No Implantable device outside of the clinic excluding No cellular tissue based products placed in the center since last visit: Pain Present Now: No Electronic Signature(s) Signed: 04/16/2021 2:31:45 PM By: Valeria Batman EMT Entered By: Valeria Batman on 04/16/2021 14:31:45 -------------------------------------------------------------------------------- Encounter Discharge Information Details Patient Name: Date of Service: [name], [name] 04/16/2021 9:00 A M Medical Record Number: [id number] Patient Account Number: [id number] Date of Birth/Sex: Treating RN: 05-22-1970 (51 y.o. [name] Primary Care Ledford Goodson: [name] Other Clinician: Valeria Batman Referring Pearly Apachito: Treating Kamdyn Colborn/Extender: [name], [name] in Treatment: 31 Encounter Discharge Information Items Discharge  Condition: Stable Ambulatory Status: Cane Discharge Destination: Home Transportation: Private Auto Accompanied By: None Schedule Follow-up Appointment: Yes Clinical Summary of Care: Patient Declined Electronic Signature(s) Signed: 04/16/2021 2:45:07 PM By: Valeria Batman EMT Entered By: Valeria Batman on 04/16/2021 14:45:06 -------------------------------------------------------------------------------- Patient/Caregiver Education Details Patient Name: Date of Service: [name], [place] 1/19/2023andnbsp9:00 A M Medical Record Number: [id number] Patient Account Number: [id number] Date of Birth/Gender: Treating RN: [birth year] (51 y.o. [name] Primary Care Physician: [name] Other Clinician: Valeria Batman Referring Physician: Treating Physician/Extender: Bobbye Charleston in Treatment: 31 Education Assessment Education Provided To: Patient Education Topics Provided Electronic Signature(s) Signed: 04/16/2021 2:44:43 PM By: Valeria Batman EMT Entered By: Valeria Batman on 04/16/2021 14:44:42 -------------------------------------------------------------------------------- Vitals Details Patient Name: Date of Service: [name], [place] [name] 04/16/2021 9:00 A M Medical Record Number: [id number] Patient Account Number: [id number] Date of Birth/Sex: Treating RN: August 28, 1970 (50 y.o. [name] Primary Care [place]: [name] Other Clinician: Valeria Batman Referring Cythia Bachtel: Treating Davine Coba/Extender: [name], [name] in Treatment: 31 Vital Signs Time Taken: 09:06 Temperature (F): 98.2 Height (in): 65 Pulse (bpm): 91 Weight (lbs): 196 Respiratory Rate (breaths/min): 16 Body Mass Index (BMI): 32.6 Blood Pressure (mmHg): 136/82 Capillary Blood Glucose (mg/dl): 219 Reference Range: 80 - 120 mg / dl Notes Information entered from paper in-take sheet. Electronic Signature(s) Signed: 04/16/2021 2:32:53 PM By:  Valeria Batman EMT Entered By: Valeria Batman on 04/16/2021 14:32:53
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
## 2021-04-17 NOTE — ED Notes (Signed)
Attempted IV 2X.

## 2021-04-17 NOTE — Progress Notes (Signed)
Pharmacy Antibiotic Note  [name] is a 51 y.o. male admitted on 04/17/2021 with  wound infection .  Pharmacy has been consulted for Vancomycin dosing.  Plan: Vancomycin 2gm IV x1 now then 1500 mg IV q48h to target AUC 400-550 Check Vancomycin levels at steady state Monitor renal function and cx data   Height: 5\' 4"  (162.6 cm) Weight: 88.9 kg (196 lb) IBW/kg (Calculated) : 59.2  Temp (24hrs), Avg:98.5 F (36.9 C), Min:98.2 F (36.8 C), Max:98.8 F (37.1 C)  Recent Labs  Lab 04/17/21 1717  WBC 15.2*  CREATININE 3.21*    Estimated Creatinine Clearance: 27.4 mL/min (A) (by C-G formula based on SCr of 3.21 mg/dL (H)).    Allergies  Allergen Reactions   Bee Venom Swelling    SWELLING REACTION UNSPECIFIED    Penicillins Hives and Other (See Comments)    Full body hives as a child with no shortness of breath or swelling  **Can tolerate cephalosporins**   Clonidine Derivatives     Pt states "It is making me dizzy"    Antimicrobials this admission: 1/20 Vancomycin >>     Dose adjustments this admission:  Microbiology results: 1/20 BCx:   Thank you for allowing pharmacy to be a part of this patients care.  Netta Cedars PharmD 04/17/2021 10:45 PM
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
## 2021-04-18 DIAGNOSIS — L089 Local infection of the skin and subcutaneous tissue, unspecified: Secondary | ICD-10-CM | POA: Diagnosis not present

## 2021-04-18 LAB — BLOOD CULTURE ID PANEL (REFLEXED) - BCID2

## 2021-04-18 LAB — CBC
HCT: 27.3 % — ABNORMAL LOW (ref 39.0–52.0)
Hemoglobin: 8.6 g/dL — ABNORMAL LOW (ref 13.0–17.0)
MCH: 27.7 pg (ref 26.0–34.0)
MCHC: 31.5 g/dL (ref 30.0–36.0)
MCV: 87.8 fL (ref 80.0–100.0)
Platelets: 289 10*3/uL (ref 150–400)
RBC: 3.11 MIL/uL — ABNORMAL LOW (ref 4.22–5.81)
RDW: 14.3 % (ref 11.5–15.5)
WBC: 15 10*3/uL — ABNORMAL HIGH (ref 4.0–10.5)
nRBC: 0 % (ref 0.0–0.2)

## 2021-04-18 LAB — SURGICAL PCR SCREEN
MRSA, PCR: NEGATIVE
Staphylococcus aureus: NEGATIVE

## 2021-04-18 LAB — COMPREHENSIVE METABOLIC PANEL
ALT: 17 U/L (ref 0–44)
AST: 11 U/L — ABNORMAL LOW (ref 15–41)
Albumin: 2.7 g/dL — ABNORMAL LOW (ref 3.5–5.0)
Alkaline Phosphatase: 63 U/L (ref 38–126)
Anion gap: 9 (ref 5–15)
BUN: 30 mg/dL — ABNORMAL HIGH (ref 6–20)
CO2: 19 mmol/L — ABNORMAL LOW (ref 22–32)
Calcium: 8.3 mg/dL — ABNORMAL LOW (ref 8.9–10.3)
Chloride: 109 mmol/L (ref 98–111)
Creatinine, Ser: 3.06 mg/dL — ABNORMAL HIGH (ref 0.61–1.24)
GFR, Estimated: 24 mL/min — ABNORMAL LOW (ref >60)
Glucose, Bld: 94 mg/dL (ref 70–99)
Potassium: 3.8 mmol/L (ref 3.5–5.1)
Sodium: 137 mmol/L (ref 135–145)
Total Bilirubin: 0.6 mg/dL (ref 0.3–1.2)
Total Protein: 7.5 g/dL (ref 6.5–8.1)

## 2021-04-18 LAB — HIV ANTIBODY (ROUTINE TESTING W REFLEX): HIV Screen 4th Generation wRfx: NONREACTIVE

## 2021-04-18 LAB — GLUCOSE, CAPILLARY
Glucose-Capillary: 106 mg/dL — ABNORMAL HIGH (ref 70–99)
Glucose-Capillary: 107 mg/dL — ABNORMAL HIGH (ref 70–99)
Glucose-Capillary: 122 mg/dL — ABNORMAL HIGH (ref 70–99)
Glucose-Capillary: 140 mg/dL — ABNORMAL HIGH (ref 70–99)
Glucose-Capillary: 78 mg/dL (ref 70–99)
Glucose-Capillary: 78 mg/dL (ref 70–99)
Glucose-Capillary: 82 mg/dL (ref 70–99)

## 2021-04-18 LAB — MAGNESIUM: Magnesium: 2 mg/dL (ref 1.7–2.4)

## 2021-04-18 LAB — PHOSPHORUS: Phosphorus: 3.5 mg/dL (ref 2.5–4.6)

## 2021-04-18 MED ORDER — DEXTROSE 5 % IV SOLN: INTRAVENOUS | Status: DC

## 2021-04-18 MED ORDER — VANCOMYCIN HCL IN DEXTROSE 1-5 GM/200ML-% IV SOLN: 1000.0000 mg | INTRAVENOUS | Status: DC

## 2021-04-18 MED ORDER — SODIUM CHLORIDE 0.9 % IV SOLN: INTRAVENOUS | Status: DC

## 2021-04-18 MED ORDER — INSULIN GLARGINE 100 UNIT/ML SOLOSTAR PEN: 10.0000 [IU] | PEN_INJECTOR | Freq: Two times a day (BID) | SUBCUTANEOUS | Status: DC

## 2021-04-18 MED ORDER — CEFAZOLIN SODIUM-DEXTROSE 2-4 GM/100ML-% IV SOLN
2.0000 g | Freq: Two times a day (BID) | INTRAVENOUS | Status: DC
  Administered 2021-04-18: 2 g via INTRAVENOUS
  Filled 2021-04-18: qty 100

## 2021-04-18 MED ORDER — INSULIN GLARGINE-YFGN 100 UNIT/ML ~~LOC~~ SOLN
10.0000 [IU] | Freq: Two times a day (BID) | SUBCUTANEOUS | Status: DC
  Administered 2021-04-18 (×2): 10 [IU] via SUBCUTANEOUS
  Filled 2021-04-18 (×3): qty 0.1
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
## 2021-04-18 NOTE — Hospital Course (Addendum)
51 year old male with history of DM2, HTN, CKD 3B, recurrent right foot wound seen by podiatry as an outpatient comes into the hospital due to worsening of his wound.  He was sent here from his podiatrist office.  Blood cultures have returned positive for MSSA and ID was consulted.  He was taken to the OR on 1/25 and underwent final debridement.  Given MSSA bacteremia a TEE is pending for Monday.

## 2021-04-18 NOTE — Progress Notes (Signed)
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  [facility]2 worsening wound-Dr. March Rummage to evaluate for this  Leukocytosis on admission BUN/creatinine 30/3.2   Hospital-Problem based course  Sepsis POA 2/2 cellulitis/deeper wound infection right forefoot Surgery planned for 1/22, continue aspirin 81 every morning Continue vancomycin every 48 Allow diet, NS 75 cc/H Rest as per podiatrist CKD 3B-Baseline creatinine about 2.4 03/2020 Creatinine on admission 3.2  continue IVF as above--- will probably need to start planning for HD given GFR 24--will need PCP referral to nephrology on discharge if no better on discharge HTN Continue amlodipine 10 daily, clonidine 0.1 twice daily, Coreg 25 twice daily DM TY 2 CBG 70-140 Continue sliding scale--Lantus 10 twice daily resume home dose on discharge Continue gabapentin 300 3 times daily May need dosage adjustment Add back Trulicity on discharge  DVT prophylaxis: SCD Code Status: Full Family Communication: None Disposition:  Status is: Inpatient  Remains inpatient appropriate because: Needs amputation  Consultants:  Podiatry  Procedures:   Antimicrobials: Vancomycin since admission   Subjective: Looks well-asking questions about whether he will be getting surgery today Otherwise fair pain seems controlled-no chest pain no fevers  Objective: Vitals:   04/17/21 2058 04/18/21 0059 04/18/21 0520 04/18/21 0614  BP: (!) 148/83 125/71 132/70 138/83  Pulse: 85 85 90 86  Resp: 16 16 19 20    Temp: 98.8 F (37.1 C) 98.2 F (36.8 C) 98.5 F (36.9 C) 98.5 F (36.9 C)  TempSrc:  Oral Oral Oral  SpO2: 100% 100% 98% 100%  Weight:      Height:        Intake/Output Summary (Last 24 hours) at 04/18/2021 1208 Last data filed at 04/18/2021 0092 Gross per 24 hour  Intake 92.38 ml  Output 550 ml  Net -457.62 ml   Filed Weights   04/17/21 1643  Weight: 88.9 kg    Examination:  Coherent black male no distress poor dentition no JVD Abdomen soft no rebound S1-S2 no murmur Large callus bottom of left foot I did not examine the wrap to right foot Neuro intact moving 4 limbs equally Psych euthymic  Data Reviewed: personally reviewed   CBC    Component Value Date/Time   WBC 15.0 (H) 04/18/2021 0318   RBC 3.11 (L) 04/18/2021 0318   HGB 8.6 (L) 04/18/2021 0318   HGB 10.3 (L) 01/13/2021 0939   HGB 10.8 (L) 06/24/2020 1055   HCT 27.3 (L) 04/18/2021 0318   HCT 33.5 (L) 06/24/2020 1055   PLT 289 04/18/2021 0318   PLT 310 01/13/2021 0939   PLT 437 06/24/2020 1055   MCV 87.8 04/18/2021 0318   MCV 84 06/24/2020 1055   MCH 27.7 04/18/2021 0318   MCHC 31.5 04/18/2021 0318   RDW 14.3 04/18/2021 0318   RDW 14.1 06/24/2020 1055   LYMPHSABS 2.3 04/17/2021 1717   LYMPHSABS 3.3 (H) 06/24/2020 1055   MONOABS 0.9 04/17/2021 1717   EOSABS 0.3 04/17/2021 1717   EOSABS 0.3 06/24/2020 1055   BASOSABS 0.0 04/17/2021 1717   BASOSABS 0.1 06/24/2020 1055   CMP Latest Ref Rng & Units 04/18/2021 04/17/2021 01/13/2021  Glucose  70 - 99 mg/dL 94 183(H) 112(H)  BUN 6 - 20 mg/dL 30(H) 30(H) 26(H)  Creatinine 0.61 - 1.24 mg/dL 3.06(H) 3.21(H) 2.79(H)  Sodium 135 - 145 mmol/L 137 135 140  Potassium 3.5 - 5.1 mmol/L 3.8 4.1 4.8  Chloride 98 - 111 mmol/L 109 104 110  CO2 22 - 32 mmol/L 19(L) 21(L) 22  Calcium 8.9 - 10.3 mg/dL 8.3(L) 8.6(L) 8.9  Total Protein 6.5 - 8.1 g/dL 7.5 - 8.0  Total Bilirubin 0.3 - 1.2 mg/dL 0.6 - 0.3  Alkaline Phos 38 - 126 U/L 63 - 107  AST 15 - 41 U/L 11(L) - 9(L)   ALT 0 - 44 U/L 17 - 11     Radiology Studies: No results found.   Scheduled Meds:  amLODipine  10 mg Oral Daily   aspirin EC  81 mg Oral q morning   atorvastatin  20 mg Oral Daily   carvedilol  25 mg Oral BID   cloNIDine  0.1 mg Oral BID   ferrous sulfate  325 mg Oral Q breakfast   gabapentin  300 mg Oral TID   insulin aspart  0-9 Units Subcutaneous Q4H   Continuous Infusions:  dextrose     [START ON 04/19/2021] vancomycin       LOS: 1 day   Time spent: [place], MD [facility] To contact the attending provider between 7A-7P or the covering provider during after hours 7P-7A, please log into the web site [email or web address] and access using universal  password for that web site. If you do not have the password, please call the hospital operator.  04/18/2021, 12:08 PM

## 2021-04-18 NOTE — Plan of Care (Signed)
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
## 2021-04-18 NOTE — Progress Notes (Signed)
PHARMACY - PHYSICIAN COMMUNICATION CRITICAL VALUE ALERT - BLOOD CULTURE IDENTIFICATION (BCID)  [name] is an 51 y.o. male who presented to [facility] on 04/17/2021 with a chief complaint of worsening foot ulcer.  Assessment:  1 bottle +MSSA  Name of physician (or Provider) Contacted: Dr. Verlon Au  Current antibiotics: vancomycin  Changes to prescribed antibiotics recommended:  Recommendations accepted by provider - change to cefazolin  Results for orders placed or performed during the hospital encounter of 04/17/21  Blood Culture ID Panel (Reflexed) (Collected: 04/17/2021  5:17 PM)  Result Value Ref Range   Enterococcus faecalis NOT DETECTED NOT DETECTED   Enterococcus Faecium NOT DETECTED NOT DETECTED   Listeria monocytogenes NOT DETECTED NOT DETECTED   Staphylococcus species DETECTED (A) NOT DETECTED   Staphylococcus aureus (BCID) DETECTED (A) NOT DETECTED   Staphylococcus epidermidis NOT DETECTED NOT DETECTED   Staphylococcus lugdunensis NOT DETECTED NOT DETECTED   Streptococcus species NOT DETECTED NOT DETECTED   Streptococcus agalactiae NOT DETECTED NOT DETECTED   Streptococcus pneumoniae NOT DETECTED NOT DETECTED   Streptococcus pyogenes NOT DETECTED NOT DETECTED   A.calcoaceticus-baumannii NOT DETECTED NOT DETECTED   Bacteroides fragilis NOT DETECTED NOT DETECTED   Enterobacterales NOT DETECTED NOT DETECTED   Enterobacter cloacae complex NOT DETECTED NOT DETECTED   Escherichia coli NOT DETECTED NOT DETECTED   Klebsiella aerogenes NOT DETECTED NOT DETECTED   Klebsiella oxytoca NOT DETECTED NOT DETECTED   Klebsiella pneumoniae NOT DETECTED NOT DETECTED   Proteus species NOT DETECTED NOT DETECTED   Salmonella species NOT DETECTED NOT DETECTED   Serratia marcescens NOT DETECTED NOT DETECTED   Haemophilus influenzae NOT DETECTED NOT DETECTED   Neisseria meningitidis NOT DETECTED NOT DETECTED   Pseudomonas aeruginosa NOT DETECTED NOT DETECTED   Stenotrophomonas  maltophilia NOT DETECTED NOT DETECTED   Candida albicans NOT DETECTED NOT DETECTED   Candida auris NOT DETECTED NOT DETECTED   Candida glabrata NOT DETECTED NOT DETECTED   Candida krusei NOT DETECTED NOT DETECTED   Candida parapsilosis NOT DETECTED NOT DETECTED   Candida tropicalis NOT DETECTED NOT DETECTED   Cryptococcus neoformans/gattii NOT DETECTED NOT DETECTED   Meth resistant mecA/C and MREJ NOT DETECTED NOT DETECTED    [name] 04/18/2021  6:30 PM

## 2021-04-19 ENCOUNTER — Inpatient Hospital Stay (HOSPITAL_COMMUNITY): Payer: 59

## 2021-04-19 ENCOUNTER — Inpatient Hospital Stay (HOSPITAL_COMMUNITY): Payer: 59 | Admitting: Anesthesiology

## 2021-04-19 ENCOUNTER — Encounter (HOSPITAL_COMMUNITY)
Admission: EM | Disposition: A | Discharge status: Home Health | Payer: Self-pay | Source: Home / Self Care | Attending: Family Medicine

## 2021-04-19 DIAGNOSIS — M869 Osteomyelitis, unspecified: Secondary | ICD-10-CM | POA: Diagnosis not present

## 2021-04-19 DIAGNOSIS — B9561 Methicillin susceptible Staphylococcus aureus infection as the cause of diseases classified elsewhere: Secondary | ICD-10-CM

## 2021-04-19 DIAGNOSIS — L089 Local infection of the skin and subcutaneous tissue, unspecified: Secondary | ICD-10-CM | POA: Diagnosis not present

## 2021-04-19 DIAGNOSIS — R7881 Bacteremia: Secondary | ICD-10-CM | POA: Diagnosis not present

## 2021-04-19 HISTORY — PX: AMPUTATION: SHX166

## 2021-04-19 LAB — ECHOCARDIOGRAM COMPLETE
AR max vel: 1.76 cm2
AV Area VTI: 1.78 cm2
AV Area mean vel: 1.67 cm2
AV Mean grad: 4 mmHg
AV Peak grad: 7.8 mmHg
Ao pk vel: 1.4 m/s
Area-P 1/2: 3.99 cm2
Calc EF: 56.2 %
Height: 64 in
S' Lateral: 2.6 cm
Single Plane A2C EF: 57.2 %
Single Plane A4C EF: 59.8 %
Weight: 3136 oz

## 2021-04-19 LAB — GLUCOSE, CAPILLARY
Glucose-Capillary: 129 mg/dL — ABNORMAL HIGH (ref 70–99)
Glucose-Capillary: 144 mg/dL — ABNORMAL HIGH (ref 70–99)
Glucose-Capillary: 157 mg/dL — ABNORMAL HIGH (ref 70–99)
Glucose-Capillary: 67 mg/dL — ABNORMAL LOW (ref 70–99)
Glucose-Capillary: 70 mg/dL (ref 70–99)
Glucose-Capillary: 74 mg/dL (ref 70–99)
Glucose-Capillary: 89 mg/dL (ref 70–99)
Glucose-Capillary: 96 mg/dL (ref 70–99)

## 2021-04-19 LAB — COMPREHENSIVE METABOLIC PANEL
ALT: 17 U/L (ref 0–44)
AST: 14 U/L — ABNORMAL LOW (ref 15–41)
Albumin: 2.3 g/dL — ABNORMAL LOW (ref 3.5–5.0)
Alkaline Phosphatase: 60 U/L (ref 38–126)
Anion gap: 7 (ref 5–15)
BUN: 28 mg/dL — ABNORMAL HIGH (ref 6–20)
CO2: 20 mmol/L — ABNORMAL LOW (ref 22–32)
Calcium: 8.2 mg/dL — ABNORMAL LOW (ref 8.9–10.3)
Chloride: 110 mmol/L (ref 98–111)
Creatinine, Ser: 2.74 mg/dL — ABNORMAL HIGH (ref 0.61–1.24)
GFR, Estimated: 27 mL/min — ABNORMAL LOW (ref >60)
Glucose, Bld: 78 mg/dL (ref 70–99)
Potassium: 3.8 mmol/L (ref 3.5–5.1)
Sodium: 137 mmol/L (ref 135–145)
Total Bilirubin: 0.5 mg/dL (ref 0.3–1.2)
Total Protein: 7 g/dL (ref 6.5–8.1)

## 2021-04-19 LAB — CBC
HCT: 25.8 % — ABNORMAL LOW (ref 39.0–52.0)
Hemoglobin: 8.1 g/dL — ABNORMAL LOW (ref 13.0–17.0)
MCH: 27.5 pg (ref 26.0–34.0)
MCHC: 31.4 g/dL (ref 30.0–36.0)
MCV: 87.5 fL (ref 80.0–100.0)
Platelets: 299 10*3/uL (ref 150–400)
RBC: 2.95 MIL/uL — ABNORMAL LOW (ref 4.22–5.81)
RDW: 14.1 % (ref 11.5–15.5)
WBC: 12.5 10*3/uL — ABNORMAL HIGH (ref 4.0–10.5)
nRBC: 0 % (ref 0.0–0.2)

## 2021-04-19 SURGERY — AMPUTATION, FOOT, PARTIAL
Anesthesia: Monitor Anesthesia Care | Site: Foot | Laterality: Right

## 2021-04-19 MED ORDER — MIDAZOLAM HCL 5 MG/5ML IJ SOLN
INTRAMUSCULAR | Status: DC | PRN
  Administered 2021-04-19: 2 mg via INTRAVENOUS

## 2021-04-19 MED ORDER — 0.9 % SODIUM CHLORIDE (POUR BTL) OPTIME
TOPICAL | Status: DC | PRN
  Administered 2021-04-19: 1000 mL

## 2021-04-19 MED ORDER — DEXTROSE 5 % IV SOLN: INTRAVENOUS | Status: DC

## 2021-04-19 MED ORDER — PROPOFOL 10 MG/ML IV BOLUS
INTRAVENOUS | Status: AC
  Filled 2021-04-19: qty 20

## 2021-04-19 MED ORDER — BUPIVACAINE-EPINEPHRINE (PF) 0.5% -1:200000 IJ SOLN
INTRAMUSCULAR | Status: DC | PRN
  Administered 2021-04-19: 15 mL via PERINEURAL
  Administered 2021-04-19: 30 mL via PERINEURAL

## 2021-04-19 MED ORDER — FENTANYL CITRATE (PF) 100 MCG/2ML IJ SOLN
INTRAMUSCULAR | Status: AC
  Filled 2021-04-19: qty 2

## 2021-04-19 MED ORDER — PROPOFOL 500 MG/50ML IV EMUL
INTRAVENOUS | Status: DC | PRN
  Administered 2021-04-19: 50 ug/kg/min via INTRAVENOUS

## 2021-04-19 MED ORDER — PROPOFOL 10 MG/ML IV BOLUS
INTRAVENOUS | Status: DC | PRN
  Administered 2021-04-19: 10 mg via INTRAVENOUS
  Administered 2021-04-19: 50 mg via INTRAVENOUS

## 2021-04-19 MED ORDER — MIDAZOLAM HCL 2 MG/2ML IJ SOLN
INTRAMUSCULAR | Status: AC
  Filled 2021-04-19: qty 2

## 2021-04-19 MED ORDER — DEXTROSE 50 % IV SOLN
12.5000 g | INTRAVENOUS | Status: AC
  Administered 2021-04-19: 12.5 g via INTRAVENOUS
  Filled 2021-04-19: qty 50

## 2021-04-19 MED ORDER — CEFAZOLIN SODIUM-DEXTROSE 2-4 GM/100ML-% IV SOLN
2.0000 g | Freq: Three times a day (TID) | INTRAVENOUS | Status: DC
  Administered 2021-04-19 – 2021-04-24 (×15): 2 g via INTRAVENOUS
  Filled 2021-04-19 (×15): qty 100

## 2021-04-19 MED ORDER — ONDANSETRON HCL 4 MG/2ML IJ SOLN
4.0000 mg | Freq: Four times a day (QID) | INTRAMUSCULAR | Status: DC
  Administered 2021-04-19 – 2021-04-26 (×7): 4 mg via INTRAVENOUS
  Filled 2021-04-19 (×15): qty 2

## 2021-04-19 MED ORDER — SODIUM CHLORIDE 0.9 % IR SOLN
Status: DC | PRN
  Administered 2021-04-19: 3000 mL

## 2021-04-19 MED ORDER — BUPIVACAINE HCL (PF) 0.5 % IJ SOLN
INTRAMUSCULAR | Status: AC
  Filled 2021-04-19: qty 30

## 2021-04-19 MED ORDER — LEVOFLOXACIN 500 MG PO TABS
750.0000 mg | ORAL_TABLET | ORAL | Status: DC
  Administered 2021-04-19: 750 mg via ORAL
  Filled 2021-04-19 (×2): qty 2

## 2021-04-19 MED ORDER — FENTANYL CITRATE (PF) 100 MCG/2ML IJ SOLN
INTRAMUSCULAR | Status: DC | PRN
  Administered 2021-04-19: 100 ug via INTRAVENOUS

## 2021-04-19 MED ORDER — LACTATED RINGERS IV SOLN: INTRAVENOUS | Status: DC | PRN

## 2021-04-19 MED ORDER — CEFAZOLIN SODIUM-DEXTROSE 2-4 GM/100ML-% IV SOLN
INTRAVENOUS | Status: AC
  Filled 2021-04-19: qty 100

## 2021-04-19 SURGICAL SUPPLY — 43 items
BAG COUNTER SPONGE SURGICOUNT (BAG) IMPLANT
BAG SPNG CNTER NS LX DISP (BAG)
BLADE AVERAGE 25X9 (BLADE) IMPLANT
BLADE OSC/SAG .038X5.5 CUT EDG (BLADE) IMPLANT
BLADE SURG 15 STRL LF DISP TIS (BLADE) IMPLANT
BLADE SURG 15 STRL SS (BLADE)
BNDG CONFORM 4 STRL LF (GAUZE/BANDAGES/DRESSINGS) ×3 IMPLANT
BNDG ELASTIC 4X5.8 VLCR STR LF (GAUZE/BANDAGES/DRESSINGS) ×1 IMPLANT
BNDG ELASTIC 6X5.8 VLCR STR LF (GAUZE/BANDAGES/DRESSINGS) ×3 IMPLANT
BNDG GAUZE ELAST 4 BULKY (GAUZE/BANDAGES/DRESSINGS) ×1 IMPLANT
CLEANER TIP ELECTROSURG 2X2 (MISCELLANEOUS) IMPLANT
CNTNR URN SCR LID CUP LEK RST (MISCELLANEOUS) IMPLANT
CONT SPEC 4OZ STRL OR WHT (MISCELLANEOUS)
COVER SURGICAL LIGHT HANDLE (MISCELLANEOUS) ×3 IMPLANT
CUFF TOURN SGL QUICK 18 (TOURNIQUET CUFF) IMPLANT
DECANTER SPIKE VIAL GLASS SM (MISCELLANEOUS) IMPLANT
GAUZE SPONGE 4X4 12PLY STRL (GAUZE/BANDAGES/DRESSINGS) ×3 IMPLANT
GAUZE XEROFORM 1X8 LF (GAUZE/BANDAGES/DRESSINGS) ×3 IMPLANT
GLOVE SRG 8 PF TXTR STRL LF DI (GLOVE) ×2 IMPLANT
GLOVE SURG ENC MOIS LTX SZ8 (GLOVE) ×3 IMPLANT
GLOVE SURG NEOPR MICRO LF SZ8 (GLOVE) ×3 IMPLANT
GLOVE SURG UNDER POLY LF SZ8 (GLOVE) ×2
GOWN L4 XXLG W/PAP TWL (GOWN DISPOSABLE) ×3 IMPLANT
HANDPIECE INTERPULSE COAX TIP (DISPOSABLE)
HEMOSTAT SURGICEL 4X8 (HEMOSTASIS) ×1 IMPLANT
KIT BASIN OR (CUSTOM PROCEDURE TRAY) ×3 IMPLANT
NDL HYPO 25X1 1.5 SAFETY (NEEDLE) ×2 IMPLANT
NEEDLE HYPO 25X1 1.5 SAFETY (NEEDLE) ×2 IMPLANT
PACK ORTHO EXTREMITY (CUSTOM PROCEDURE TRAY) ×3 IMPLANT
PADDING UNDERCAST 2 STRL (CAST SUPPLIES) ×1
PADDING UNDERCAST 2X4 STRL (CAST SUPPLIES) ×2 IMPLANT
PENCIL SMOKE EVACUATOR (MISCELLANEOUS) ×3 IMPLANT
SET HNDPC FAN SPRY TIP SCT (DISPOSABLE) IMPLANT
STAPLER VISISTAT 35W (STAPLE) ×3 IMPLANT
SUT ETHILON 2 0 PS N (SUTURE) ×2 IMPLANT
SUT ETHILON 4 0 PS 2 18 (SUTURE) ×3 IMPLANT
SUT MNCRL AB 4-0 PS2 18 (SUTURE) ×3 IMPLANT
SUT VIC AB 2-0 SH 27 (SUTURE) ×4
SUT VIC AB 2-0 SH 27X BRD (SUTURE) IMPLANT
SYR 20ML LL LF (SYRINGE) IMPLANT
TOWEL OR 17X26 10 PK STRL BLUE (TOWEL DISPOSABLE) ×3 IMPLANT
TOWEL OR NON WOVEN STRL DISP B (DISPOSABLE) ×3 IMPLANT
UNDERPAD 30X36 HEAVY ABSORB (UNDERPADS AND DIAPERS) ×6 IMPLANT

## 2021-04-19 NOTE — Progress Notes (Signed)
PHARMACY NOTE:  ANTIMICROBIAL RENAL DOSAGE ADJUSTMENT  Current antimicrobial regimen includes a mismatch between antimicrobial dosage and estimated renal function.  As per policy approved by the Pharmacy & Therapeutics and Medical Executive Committees, the antimicrobial dosage will be adjusted accordingly.  Current antimicrobial dosage:  Ancef 2gm IV q12h  Indication: MSSA bacteremia  Renal Function:  Estimated Creatinine Clearance: 32.1 mL/min (A) (by C-G formula based on SCr of 2.74 mg/dL (H)). []      On intermittent HD, scheduled: []      On CRRT    Antimicrobial dosage has been changed to:  Ancef 2gm IV q8h  Additional comments:   Thank you for allowing pharmacy to be a part of this patient's care.  Netta Cedars, [facility] 04/19/2021 7:20 AM

## 2021-04-19 NOTE — Transfer of Care (Signed)
Immediate Anesthesia Transfer of Care Note  Patient: [name]  Procedure(s) Performed: RIGHT FOOT CHOPART AMPUTATION (Right: Foot)  Patient Location: PACU  Anesthesia Type:MAC and MAC combined with regional for post-op pain  Level of Consciousness: sedated and patient cooperative  Airway & Oxygen Therapy: Patient Spontanous Breathing and Patient connected to face mask oxygen  Post-op Assessment: Report given to RN and Post -op Vital signs reviewed and stable  Post vital signs: stable  Last Vitals:  Vitals Value Taken Time  BP 116/77 04/19/21 1430  Temp 37.2 C 04/19/21 1426  Pulse 86 04/19/21 1430  Resp 16 04/19/21 1430  SpO2 96 % 04/19/21 1430  Vitals shown include unvalidated device data.  Last Pain:  Vitals:   04/19/21 1426  TempSrc:   PainSc: Asleep      Patients Stated Pain Goal: 2 (78/47/84 1282)  Complications: No notable events documented.

## 2021-04-19 NOTE — Progress Notes (Signed)
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  [facility] 2/2 worsening wound-Dr. March Rummage to evaluate for this  Leukocytosis on admission BUN/creatinine 30/3.2   Hospital-Problem based course  Sepsis POA 2/2 MSSA cellulitis/deeper wound infection right forefoot continue aspirin 81 every morning Continue vancomycin every 48 D5 @ 100cc/h and can saline lock when eating Rest as per podiatrist ID will see--I have ordered ECHO CKD 3B-Baseline creatinine about 2.4 03/2020 Creatinine on admission 3.2  continue IVF as above--labs are improved Need OP Nephrology input HTN Continue amlodipine 10 daily, clonidine 0.1 twice daily, Coreg 25 twice daily DM TY 2 Low of 67 this am D/c Long acting for now--cont SSI Needs resumption of LA when eating  DVT prophylaxis: SCD Code Status: Full Family Communication: None Disposition:  Status is: Inpatient  Remains inpatient appropriate because: Needs amputation  Consultants:  Podiatry  Procedures:   Antimicrobials: Vancomycin since admission   Subjective:  NPO again awaiting Surgery  Objective: Vitals:   04/18/21 0614 04/18/21 1327 04/18/21 2056 04/19/21 0358  BP: 138/83 139/76 140/84 136/85  Pulse: 86 90 90 87  Resp: 20 18 16 17   Temp: 98.5 F (36.9 C) 98 F (36.7 C) 98.5 F (36.9 C) 98.6 F (37 C)  TempSrc: Oral Oral Oral Oral  SpO2: 100% 100% 100% 99%  Weight:      Height:        Intake/Output Summary (Last 24 hours) at 04/19/2021 1022 Last data filed at 04/19/2021 0600 Gross  per 24 hour  Intake 2057.89 ml  Output 900 ml  Net 1157.89 ml    Filed Weights   04/17/21 1643  Weight: 88.9 kg    Examination:  Coherent black male no distress poor dentition no JVD Abdomen soft no rebound S1-S2 no murmur Chest clear no rales--no wheeze Neuro intact--power 5/5  Data Reviewed: personally reviewed   CBC    Component Value Date/Time   WBC 12.5 (H) 04/19/2021 0310   RBC 2.95 (L) 04/19/2021 0310   HGB 8.1 (L) 04/19/2021 0310   HGB 10.3 (L) 01/13/2021 0939   HGB 10.8 (L) 06/24/2020 1055   HCT 25.8 (L) 04/19/2021 0310   HCT 33.5 (L) 06/24/2020 1055   PLT 299 04/19/2021 0310   PLT 310 01/13/2021 0939   PLT 437 06/24/2020 1055   MCV 87.5 04/19/2021 0310   MCV 84 06/24/2020 1055   MCH 27.5 04/19/2021 0310   MCHC 31.4 04/19/2021 0310   RDW 14.1 04/19/2021 0310   RDW 14.1 06/24/2020 1055   LYMPHSABS 2.3 04/17/2021 1717   LYMPHSABS 3.3 (H) 06/24/2020 1055   MONOABS 0.9 04/17/2021 1717   EOSABS 0.3 04/17/2021 1717   EOSABS 0.3 06/24/2020 1055   BASOSABS 0.0 04/17/2021 1717   BASOSABS 0.1 06/24/2020 1055   CMP Latest Ref Rng & Units 04/19/2021 04/18/2021 04/17/2021  Glucose 70 - 99 mg/dL 78 94 183(H)  BUN 6 - 20 mg/dL 28(H) 30(H) 30(H)  Creatinine 0.61 - 1.24 mg/dL 2.74(H) 3.06(H) 3.21(H)  Sodium 135 - 145 mmol/L 137 137 135  Potassium 3.5 - 5.1 mmol/L 3.8 3.8 4.1  Chloride 98 - 111 mmol/L 110 109  104  CO2 22 - 32 mmol/L 20(L) 19(L) 21(L)  Calcium 8.9 - 10.3 mg/dL 8.2(L) 8.3(L) 8.6(L)  Total Protein 6.5 - 8.1 g/dL 7.0 7.5 -  Total Bilirubin 0.3 - 1.2 mg/dL 0.5 0.6 -  Alkaline Phos 38 - 126 U/L 60 63 -  AST 15 - 41 U/L 14(L) 11(L) -  ALT 0 - 44 U/L 17 17 -     Radiology Studies: No results found.   Scheduled Meds:  amLODipine  10 mg Oral Daily   aspirin EC  81 mg Oral q morning   atorvastatin  20 mg Oral Daily   carvedilol  25 mg Oral BID   cloNIDine  0.1 mg Oral BID   ferrous sulfate  325 mg Oral Q breakfast   gabapentin  300 mg Oral TID    insulin aspart  0-9 Units Subcutaneous Q4H   Continuous Infusions:   ceFAZolin (ANCEF) IV 2 g (04/19/21 0814)   dextrose 100 mL/hr at 04/19/21 0932     LOS: 2 days   Time spent: [place], MD [facility] To contact the attending provider between 7A-7P or the covering provider during after hours 7P-7A, please log into the web site [email or web address] and access using universal [place] password for that web site. If you do not have the password, please call the hospital operator.  04/19/2021, 10:22 AM

## 2021-04-19 NOTE — Anesthesia Procedure Notes (Signed)
Anesthesia Regional Block: Popliteal block   Pre-Anesthetic Checklist: , timeout performed,  Correct Patient, Correct Site, Correct Laterality,  Correct Procedure, Correct Position, site marked,  Risks and benefits discussed,  Pre-op evaluation,  At surgeon's request and post-op pain management  Laterality: Right  Prep: Maximum Sterile Barrier Precautions used, chloraprep       Needles:  Injection technique: Single-shot  Needle Type: Echogenic Stimulator Needle     Needle Length: 9cm  Needle Gauge: 21     Additional Needles:   Procedures:,,,, ultrasound used (permanent image in chart),,    Narrative:  Start time: 04/19/2021 12:53 PM End time: 04/19/2021 1:03 PM Injection made incrementally with aspirations every 5 mL. Anesthesiologist: Roderic Palau, MD  Additional Notes: Adductor canal block with 15cc of 0.5% Bupivicaine w/1:200k epi.

## 2021-04-19 NOTE — Interval H&P Note (Signed)
History and Physical Interval Note:  04/19/2021 12:42 PM  [name]  has presented today for surgery, with the diagnosis of OSTEOMYELITIES.  The various methods of treatment have been discussed with the patient and family. After consideration of risks, benefits and other options for treatment, the patient has consented to  Procedure(s): RIGHT FOOT CHOPART AMPUTATION (Right) as a surgical intervention.  The patient's history has been reviewed, patient examined, no change in status, stable for surgery.  I have reviewed the patient's chart and labs.  Questions were answered to the patient's satisfaction.     [name]

## 2021-04-19 NOTE — Op Note (Signed)
°  Patient Name: [name] DOB: [birth year]  MRN: [id number]   Date of Surgery: 04/19/21  Surgeon: Dr. Hardie Pulley, DPM Assistants: none  Pre-operative Diagnosis:  Osteomyelitis Post-operative Diagnosis:  Same Procedures:  1) Right foot open Chopart amputation Pathology/Specimens: ID Type Source Tests Collected by Time Destination  1 : right forefoot amputation Tissue PATH Amputaion Arm/Leg SURGICAL PATHOLOGY [name], DPM 04/19/2021 1349   2 : right foot cupoid bone Tissue PATH Bone resection SURGICAL PATHOLOGY [name], DPM 04/19/2021 1354    Anesthesia: MAC/local Hemostasis:  Total Tourniquet Time Documented: Calf (Right) - 39 minutes Total: Calf (Right) - 39 minutes  Estimated Blood Loss: 10 mL Materials: * No implants in log * Medications: 10 ml 0.5% marcaine plain Complications: none  Indications for Procedure:  This is a 51 y.o. male with chronic osteomyelitis of the right foot. It was discussed that usual treatment for this would be amputation. We discussed the possibility of limb salvage. Patient elected for attempt at salvage. No guarantees were made that we would be able to avoid more proximal amputation. Discussed we would proceed with staged procedure to get rid of the infected bone and then later close the wound with likely tendon lengthening.   Procedure in Detail: Patient was identified in pre-operative holding area. Formal consent was signed and the right lower extremity was marked. Patient was brought back to the operating room. Anesthesia was induced. The extremity was prepped and draped in the usual sterile fashion. Timeout was taken to confirm patient name, laterality, and procedure prior to incision.   Attention was first directed to the right foot. There was a large ulcer laterally with purulence. This was copiously irrigated with NS via pulse lavage. Incision was then started at the medial foot. Dissection was continued to bone. Full  thickness flaps were raised dorsally and plantarly. Dissection was continued to Chopart's joint. The remaining bones were disarticulated at this joint. Disarticulation then continued laterally towards the cuboid. The cuboid was grossly necrotic with a large central cavity with abscess formation. The cuboid was sent separately for pathology. Once fully disarticulated the wound was copiously irrigated with normal saline via pulse lavage. The remaining bones appeared healthy and viable.  Surgicell was packed into the wound. The wound edges were shortened and loosely approximated. They were packed with betadine soaked gauze. The foot was then dressed with betadine wet to dry dressing, with ABD pads, kerlix, and ACE bandage. Patient tolerated the procedure well.  Disposition: Following a period of post-operative monitoring, patient will be transferred back to the floor. He would benefit from RTOR at a later date to close the wound and lengthen the Achilles tendon.

## 2021-04-19 NOTE — Anesthesia Procedure Notes (Signed)
Procedure Name: MAC Date/Time: 04/19/2021 1:07 PM Performed by: Lissa Morales, CRNA Pre-anesthesia Checklist: Patient identified, Emergency Drugs available, Suction available, Patient being monitored and Timeout performed Patient Re-evaluated:Patient Re-evaluated prior to induction Oxygen Delivery Method: Simple face mask Placement Confirmation: positive ETCO2

## 2021-04-19 NOTE — Brief Op Note (Signed)
04/19/2021  2:34 PM  PATIENT:  [name]  51 y.o. male  PRE-OPERATIVE DIAGNOSIS:  OSTEOMYELITIES  POST-OPERATIVE DIAGNOSIS:  OSTEOMYELITIES  PROCEDURE:  Procedure(s): RIGHT FOOT CHOPART AMPUTATION (Right)  SURGEON:  Surgeon(s) and Role:    * [name], DPM - Primary  PHYSICIAN ASSISTANT:   ASSISTANTS: none   ANESTHESIA:   regional and general  EBL:  10 mL   BLOOD ADMINISTERED:none  DRAINS: none   LOCAL MEDICATIONS USED:  NONE  SPECIMEN:   ID Type Source Tests Collected by Time Destination  1 : right forefoot amputation Tissue PATH Amputaion Arm/Leg SURGICAL PATHOLOGY [name], DPM 04/19/2021 1349   2 : right foot cupoid bone Tissue PATH Bone resection SURGICAL PATHOLOGY [name], DPM 04/19/2021 1354      DISPOSITION OF SPECIMEN:  PATHOLOGY  COUNTS:  YES  TOURNIQUET:   Total Tourniquet Time Documented: Calf (Right) - 39 minutes Total: Calf (Right) - 39 minutes   DICTATION: .Viviann Spare Dictation  PLAN OF CARE: Admit to inpatient   PATIENT DISPOSITION:  PACU - hemodynamically stable.   Delay start of Pharmacological VTE agent (>24hrs) due to surgical blood loss or risk of bleeding: not applicable
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
## 2021-04-19 NOTE — Progress Notes (Signed)
°* °  Echocardiogram 2D Echocardiogram has been performed.  [name] 04/19/2021, 8:24 AM

## 2021-04-19 NOTE — Anesthesia Preprocedure Evaluation (Addendum)
Anesthesia Evaluation  Patient identified by MRN, date of birth, ID band Patient awake    Reviewed: Allergy & Precautions, H&P , NPO status , Patient's Chart, lab work & pertinent test results, reviewed documented beta blocker date and time   Airway Mallampati: II  TM Distance: >3 FB Neck ROM: Full    Dental no notable dental hx. (+) Chipped, Dental Advisory Given   Pulmonary Current Smoker and Patient abstained from smoking.,    Pulmonary exam normal breath sounds clear to auscultation       Cardiovascular hypertension, Pt. on medications and Pt. on home beta blockers  Rhythm:Regular Rate:Normal     Neuro/Psych Depression negative neurological ROS     GI/Hepatic negative GI ROS, Neg liver ROS,   Endo/Other  diabetes, Poorly Controlled, Insulin Dependent  Renal/GU Renal InsufficiencyRenal disease  negative genitourinary   Musculoskeletal   Abdominal   Peds  Hematology  (+) Blood dyscrasia, anemia ,   Anesthesia Other Findings   Reproductive/Obstetrics negative OB ROS                            Anesthesia Physical Anesthesia Plan  ASA: 3  Anesthesia Plan: MAC and Regional   Post-op Pain Management:    Induction: Intravenous  PONV Risk Score and Plan: 1 and Propofol infusion, Midazolam and Ondansetron  Airway Management Planned: Natural Airway and Simple Face Mask  Additional Equipment:   Intra-op Plan:   Post-operative Plan:   Informed Consent: I have reviewed the patients History and Physical, chart, labs and discussed the procedure including the risks, benefits and alternatives for the proposed anesthesia with the patient or authorized representative who has indicated his/her understanding and acceptance.     Dental advisory given  Plan Discussed with: CRNA  Anesthesia Plan Comments:         Anesthesia Quick Evaluation

## 2021-04-19 NOTE — Anesthesia Postprocedure Evaluation (Signed)
Anesthesia Post Note  Patient: [name]  Procedure(s) Performed: RIGHT FOOT CHOPART AMPUTATION (Right: Foot)     Patient location during evaluation: PACU Anesthesia Type: Regional and MAC Level of consciousness: awake and alert Pain management: pain level controlled Vital Signs Assessment: post-procedure vital signs reviewed and stable Respiratory status: spontaneous breathing, nonlabored ventilation, respiratory function stable and patient connected to nasal cannula oxygen Cardiovascular status: blood pressure returned to baseline and stable Postop Assessment: no apparent nausea or vomiting Anesthetic complications: no   No notable events documented.  Last Vitals:  Vitals:   04/19/21 1426 04/19/21 1430  BP: 116/73 116/77  Pulse: 85 83  Resp: 17 17  Temp: 37.2 C   SpO2: 96% 96%    Last Pain:  Vitals:   04/19/21 1430  TempSrc:   PainSc: 0-No pain                 Rylei Masella,W. EDMOND

## 2021-04-20 ENCOUNTER — Encounter (HOSPITAL_COMMUNITY): Payer: Self-pay | Admitting: Podiatry

## 2021-04-20 ENCOUNTER — Encounter (HOSPITAL_BASED_OUTPATIENT_CLINIC_OR_DEPARTMENT_OTHER): Payer: 59 | Admitting: Internal Medicine

## 2021-04-20 DIAGNOSIS — Z89431 Acquired absence of right foot: Secondary | ICD-10-CM | POA: Diagnosis not present

## 2021-04-20 DIAGNOSIS — L089 Local infection of the skin and subcutaneous tissue, unspecified: Secondary | ICD-10-CM | POA: Diagnosis not present

## 2021-04-20 DIAGNOSIS — N189 Chronic kidney disease, unspecified: Secondary | ICD-10-CM | POA: Diagnosis not present

## 2021-04-20 DIAGNOSIS — N179 Acute kidney failure, unspecified: Secondary | ICD-10-CM

## 2021-04-20 LAB — CBC WITH DIFFERENTIAL/PLATELET
Abs Immature Granulocytes: 0.07 10*3/uL (ref 0.00–0.07)
Basophils Absolute: 0 10*3/uL (ref 0.0–0.1)
Basophils Relative: 0 %
Eosinophils Absolute: 0.4 10*3/uL (ref 0.0–0.5)
Eosinophils Relative: 3 %
HCT: 26.1 % — ABNORMAL LOW (ref 39.0–52.0)
Hemoglobin: 8.3 g/dL — ABNORMAL LOW (ref 13.0–17.0)
Immature Granulocytes: 1 %
Lymphocytes Relative: 14 %
Lymphs Abs: 2 10*3/uL (ref 0.7–4.0)
MCH: 27.7 pg (ref 26.0–34.0)
MCHC: 31.8 g/dL (ref 30.0–36.0)
MCV: 87 fL (ref 80.0–100.0)
Monocytes Absolute: 0.8 10*3/uL (ref 0.1–1.0)
Monocytes Relative: 6 %
Neutro Abs: 10.9 10*3/uL — ABNORMAL HIGH (ref 1.7–7.7)
Neutrophils Relative %: 76 %
Platelets: 309 10*3/uL (ref 150–400)
RBC: 3 MIL/uL — ABNORMAL LOW (ref 4.22–5.81)
RDW: 14.1 % (ref 11.5–15.5)
WBC: 14.2 10*3/uL — ABNORMAL HIGH (ref 4.0–10.5)
nRBC: 0 % (ref 0.0–0.2)

## 2021-04-20 LAB — WOUND CULTURE
MICRO NUMBER:: 12899101
SPECIMEN QUALITY:: ADEQUATE

## 2021-04-20 LAB — CULTURE, BLOOD (ROUTINE X 2)

## 2021-04-20 LAB — COMPREHENSIVE METABOLIC PANEL
ALT: 9 U/L (ref 0–44)
AST: 11 U/L — ABNORMAL LOW (ref 15–41)
Albumin: 2.3 g/dL — ABNORMAL LOW (ref 3.5–5.0)
Alkaline Phosphatase: 60 U/L (ref 38–126)
Anion gap: 5 (ref 5–15)
BUN: 26 mg/dL — ABNORMAL HIGH (ref 6–20)
CO2: 21 mmol/L — ABNORMAL LOW (ref 22–32)
Calcium: 7.8 mg/dL — ABNORMAL LOW (ref 8.9–10.3)
Chloride: 106 mmol/L (ref 98–111)
Creatinine, Ser: 2.5 mg/dL — ABNORMAL HIGH (ref 0.61–1.24)
GFR, Estimated: 30 mL/min — ABNORMAL LOW (ref >60)
Glucose, Bld: 99 mg/dL (ref 70–99)
Potassium: 4.1 mmol/L (ref 3.5–5.1)
Sodium: 132 mmol/L — ABNORMAL LOW (ref 135–145)
Total Bilirubin: 0.4 mg/dL (ref 0.3–1.2)
Total Protein: 6.8 g/dL (ref 6.5–8.1)

## 2021-04-20 LAB — GLUCOSE, CAPILLARY
Glucose-Capillary: 109 mg/dL — ABNORMAL HIGH (ref 70–99)
Glucose-Capillary: 121 mg/dL — ABNORMAL HIGH (ref 70–99)
Glucose-Capillary: 133 mg/dL — ABNORMAL HIGH (ref 70–99)
Glucose-Capillary: 126 mg/dL — ABNORMAL HIGH (ref 70–99)

## 2021-04-20 LAB — CLIENT EDUCATION TRACKING

## 2021-04-20 MED ORDER — GABAPENTIN 400 MG PO CAPS
400.0000 mg | ORAL_CAPSULE | Freq: Three times a day (TID) | ORAL | Status: DC
  Administered 2021-04-20 – 2021-04-23 (×9): 400 mg via ORAL
  Filled 2021-04-20 (×9): qty 1

## 2021-04-20 NOTE — Progress Notes (Signed)
[facility]   Reason for visit: Follow up on bacteremia, osteomyelitis  Interval History: day 4 total antibiotics; WBC 14.2, remains afebrile.  S/p amputation by Dr. March Rummage and good margins noted. No rash, no diarrhea.    Physical Exam: Constitutional:  Vitals:   04/20/21 0531 04/20/21 1124  BP: (!) 144/84 (!) 156/88  Pulse: 92 87  Resp: 17 15  Temp: 98.7 F (37.1 C) 98.1 F (36.7 C)  SpO2: 100% 100%   patient appears in NAD Respiratory: Normal respiratory effort; CTA B Cardiovascular: RRR MS: foot wrapped  Review of Systems: Constitutional: negative for fevers and chills Gastrointestinal: negative for nausea and diarrhea  Lab Results  Component Value Date   WBC 14.2 (H) 04/20/2021   HGB 8.3 (L) 04/20/2021   HCT 26.1 (L) 04/20/2021   MCV 87.0 04/20/2021   PLT 309 04/20/2021    Lab Results  Component Value Date   CREATININE 2.50 (H) 04/20/2021   BUN 26 (H) 04/20/2021   NA 132 (L) 04/20/2021   K 4.1 04/20/2021   CL 106 04/20/2021   CO2 21 (L) 04/20/2021    Lab Results  Component Value Date   ALT 9 04/20/2021   AST 11 (L) 04/20/2021   ALKPHOS 60 04/20/2021     Microbiology: Recent Results (from the past 240 hour(s))  WOUND CULTURE     Status: Abnormal (Preliminary result)   Collection Time: 04/17/21  3:03 PM  Result Value Ref Range Status   MICRO NUMBER: [id number]  Preliminary   SPECIMEN QUALITY: Adequate  Preliminary   SOURCE: R FOOT  Preliminary   STATUS: PRELIMINARY  Preliminary   GRAM STAIN:   Preliminary    No epithelial cells seen Few Polymorphonuclear leukocytes Few Gram positive cocci   ISOLATE 1: Staphylococcus aureus (A)  Preliminary    Comment: Heavy growth of Staphylococcus aureus , susceptibility test report to follow.  Resp Panel by RT-PCR (Flu A&B, Covid) Nasopharyngeal Swab     Status: None   Collection Time: 04/17/21  5:17 PM   Specimen: Nasopharyngeal Swab; Nasopharyngeal(NP) swabs in vial transport medium   Result Value Ref Range Status   SARS Coronavirus 2 by RT PCR NEGATIVE NEGATIVE Final    Comment: (NOTE) SARS-CoV-2 target nucleic acids are NOT DETECTED.  The SARS-CoV-2 RNA is generally detectable in upper respiratory specimens during the acute phase of infection. The lowest concentration of SARS-CoV-2 viral copies this assay can detect is 138 copies/mL. A negative result does not preclude SARS-Cov-2 infection and should not be used as the sole basis for treatment or other patient management decisions. A negative result may occur with  improper specimen collection/handling, submission of specimen other than nasopharyngeal swab, presence of viral mutation(s) within the areas targeted by this assay, and inadequate number of viral copies(<138 copies/mL). A negative result must be combined with clinical observations, patient history, and epidemiological information. The expected result is Negative.  Fact Sheet for Patients:  EntrepreneurPulse.com.au  Fact Sheet for Healthcare Providers:  IncredibleEmployment.be  This test is no t yet approved or cleared by the [place] FDA and  has been authorized for detection and/or diagnosis of SARS-CoV-2 by FDA under an Emergency Use Authorization (EUA). This EUA will remain  in effect (meaning this test can be used) for the duration of the COVID-19 declaration under Section 564(b)(1) of the Act, 21 U.S.C.section 360bbb-3(b)(1), unless the authorization is terminated  or revoked sooner.       Influenza A by PCR  NEGATIVE NEGATIVE Final   Influenza B by PCR NEGATIVE NEGATIVE Final    Comment: (NOTE) The Xpert Xpress SARS-CoV-2/FLU/RSV plus assay is intended as an aid in the diagnosis of influenza from Nasopharyngeal swab specimens and should not be used as a sole basis for treatment. Nasal washings and aspirates are unacceptable for Xpert Xpress SARS-CoV-2/FLU/RSV testing.  Fact Sheet for  Patients: EntrepreneurPulse.com.au  Fact Sheet for Healthcare Providers: IncredibleEmployment.be  This test is not yet approved or cleared by the [place] FDA and has been authorized for detection and/or diagnosis of SARS-CoV-2 by FDA under an Emergency Use Authorization (EUA). This EUA will remain in effect (meaning this test can be used) for the duration of the COVID-19 declaration under Section 564(b)(1) of the Act, 21 U.S.C. section 360bbb-3(b)(1), unless the authorization is terminated or revoked.  Performed at [facility], [place] [address], [place], [place] [address]   Culture, blood (routine x 2)     Status: Abnormal   Collection Time: 04/17/21  5:17 PM   Specimen: Right Antecubital; Blood  Result Value Ref Range Status   Specimen Description   Final    RIGHT ANTECUBITAL Performed at [facility] [address], [place], [place] [address]    Special Requests   Final    BOTTLES DRAWN AEROBIC AND ANAEROBIC Blood Culture results may not be optimal due to an inadequate volume of blood received in culture bottles Performed at [facility] [address], [place], [address] [address]    Culture  Setup Time   Final    GRAM POSITIVE COCCI IN CLUSTERS IN BOTH AEROBIC AND ANAEROBIC BOTTLES CRITICAL RESULT CALLED TO, READ BACK BY AND VERIFIED WITH: E,WILLIAMSON PHARMD@[id number]  04/18/21 EB Performed at [facility], [place] [address] [place],  [address]    Culture STAPHYLOCOCCUS AUREUS (A)  Final   Report Status 04/20/2021 FINAL  Final   Organism ID, Bacteria STAPHYLOCOCCUS AUREUS  Final      Susceptibility   Staphylococcus aureus - MIC*    CIPROFLOXACIN >=8 RESISTANT Resistant     ERYTHROMYCIN >=8 RESISTANT Resistant     GENTAMICIN <=0.5 SENSITIVE Sensitive     OXACILLIN 1 SENSITIVE Sensitive     TETRACYCLINE >=16 RESISTANT Resistant     VANCOMYCIN 1 SENSITIVE Sensitive      TRIMETH/SULFA <=10 SENSITIVE Sensitive     CLINDAMYCIN RESISTANT Resistant     RIFAMPIN <=0.5 SENSITIVE Sensitive     Inducible Clindamycin POSITIVE Resistant     * STAPHYLOCOCCUS AUREUS  Blood Culture ID Panel (Reflexed)     Status: Abnormal   Collection Time: 04/17/21  5:17 PM  Result Value Ref Range Status   Enterococcus faecalis NOT DETECTED NOT DETECTED Final   Enterococcus Faecium NOT DETECTED NOT DETECTED Final   Listeria monocytogenes NOT DETECTED NOT DETECTED Final   Staphylococcus species DETECTED (A) NOT DETECTED Final    Comment: CRITICAL RESULT CALLED TO, READ BACK BY AND VERIFIED WITH: E,WILLIAMSON PHARMD@[email or web address]  04/18/21 EB    Staphylococcus aureus (BCID) DETECTED (A) NOT DETECTED Final    Comment: CRITICAL RESULT CALLED TO, READ BACK BY AND VERIFIED WITH: E,WILLIAMSON PHARMD@[email or web address]  04/18/21 EB    Staphylococcus epidermidis NOT DETECTED NOT DETECTED Final   Staphylococcus lugdunensis NOT DETECTED NOT DETECTED Final   Streptococcus species NOT DETECTED NOT DETECTED Final   Streptococcus agalactiae NOT DETECTED NOT DETECTED Final   Streptococcus pneumoniae NOT DETECTED NOT DETECTED Final   Streptococcus pyogenes NOT DETECTED NOT DETECTED Final  A.calcoaceticus-baumannii NOT DETECTED NOT DETECTED Final   Bacteroides fragilis NOT DETECTED NOT DETECTED Final   Enterobacterales NOT DETECTED NOT DETECTED Final   Enterobacter cloacae complex NOT DETECTED NOT DETECTED Final   Escherichia coli NOT DETECTED NOT DETECTED Final   Klebsiella aerogenes NOT DETECTED NOT DETECTED Final   Klebsiella oxytoca NOT DETECTED NOT DETECTED Final   Klebsiella pneumoniae NOT DETECTED NOT DETECTED Final   Proteus species NOT DETECTED NOT DETECTED Final   Salmonella species NOT DETECTED NOT DETECTED Final   Serratia marcescens NOT DETECTED NOT DETECTED Final   Haemophilus influenzae NOT DETECTED NOT DETECTED Final   Neisseria meningitidis NOT DETECTED NOT DETECTED Final   Pseudomonas aeruginosa NOT  DETECTED NOT DETECTED Final   Stenotrophomonas maltophilia NOT DETECTED NOT DETECTED Final   Candida albicans NOT DETECTED NOT DETECTED Final   Candida auris NOT DETECTED NOT DETECTED Final   Candida glabrata NOT DETECTED NOT DETECTED Final   Candida krusei NOT DETECTED NOT DETECTED Final   Candida parapsilosis NOT DETECTED NOT DETECTED Final   Candida tropicalis NOT DETECTED NOT DETECTED Final   Cryptococcus neoformans/gattii NOT DETECTED NOT DETECTED Final   Meth resistant mecA/C and MREJ NOT DETECTED NOT DETECTED Final    Comment: Performed at [facility], [address], [place], [place] [address]  Surgical PCR screen     Status: None   Collection Time: 04/18/21  5:49 AM   Specimen: Nasal Mucosa; Nasal Swab  Result Value Ref Range Status   MRSA, PCR NEGATIVE NEGATIVE Final   Staphylococcus aureus NEGATIVE NEGATIVE Final    Comment: (NOTE) The Xpert SA Assay (FDA approved for NASAL specimens in patients 73 years of age and older), is one component of a comprehensive surveillance program. It is not intended to diagnose infection nor to guide or monitor treatment. Performed at [facility], [place] [address] [place], [place] [address]     Impression/Plan:  1. 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  No indication for levofloxacin with known positive culture.  Now with good source control.   Continue with cefazolin  2.  Bacteremia - MSSA and TTE with some calcifications noted.  Will repeat blood cultures.  I have requested a TEE from the cardmaster and is tentatively scheduled for Thursday.    3.  Acute on chronic renal insufficiency - creat trending back down.  At 2.5 today.  Will continue to monitor.

## 2021-04-20 NOTE — Progress Notes (Signed)
Will round on patient this evening. Intra-operative findings reassuring from an infection standpoint - there was abscess of the cuboid but otherwise without extensive necrosis or significant purulence. From an infection standpoint, believed surgical cure of osteomyelitis as the remaining bones appeared healthy and viable. Nonetheless he still at high risk of limb loss.   Plan for RTOR Wednesday for closure and TendoAchilles lengthening.   WBAT in CAM walker boot. Boot ordered. Continue abx per ID. Dressing to be left intact, change PRN strikethrough.  Please contact with questions or concerns.  [name], DPM

## 2021-04-20 NOTE — Progress Notes (Signed)
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  [facility]2 worsening wound-Dr. March Rummage to evaluate for this  Leukocytosis on admission BUN/creatinine 30/3.2  Underwent [place] Amputation 1/22--Found to have bacteremia, ID auto consult.  Going for TEE wednesday  Hospital-Problem based course  Sepsis POA 2/2 MSSA cellulitis/deeper wound infection right forefoot S/p [place] Amputation 1/22 continue aspirin 81 every morning Cefazolin per ID only [rec'd Vanc earlier] Rest as per podiatrist MSSA Bacteremia ECHO neg--TEE scheduled 1/25--appreciate ID input CKD 3B-Baseline creatinine about 2.4 03/2020 Creatinine on admission 3.2  continue IVF as above--creat today 2.5, close to usual baseline Need OP Nephrology input HTN Continue amlodipine 10 daily, clonidine 0.1 twice daily, Coreg 25 twice daily DM TY 2, complicated by Hypoglycemia  CBG's improved D/c Long acting for now--cont SSI Needs resumption of LA when eating  DVT prophylaxis: SCD Code Status: Full Family Communication: None Disposition:  Status is: Inpatient  Remains inpatient appropriate because: Needs amputation  Consultants:  Podiatry  Procedures:   Antimicrobials: Vancomycin since admission   Subjective:  Pain 6/10 "Tell me straight Doc,--am I gonna be aight?" No cp fever chills no n/v   Objective: Vitals:   04/20/21 0159 04/20/21 0531 04/20/21 1124 04/20/21 1410  BP: (!) 141/88 (!) 144/84 (!) 156/88 139/83  Pulse: 93 92 87 86  Resp: 17 17 15 16   Temp:  98.7 F (37.1 C) 98.7 F (37.1 C) 98.1 F (36.7 C) 97.8 F (36.6 C)  TempSrc: Oral Oral Oral   SpO2: 98% 100% 100% 100%  Weight:      Height:        Intake/Output Summary (Last 24 hours) at 04/20/2021 1436 Last data filed at 04/20/2021 1249 Gross per 24 hour  Intake 3669.21 ml  Output 2270 ml  Net 1399.21 ml    Filed Weights   04/17/21 1643  Weight: 88.9 kg    Examination:  Eomi ncat no focal deficit Cta b no added sound no rales no rhonchi no wheeze S1 s 2no m/r/g Abd soft nt nd no rebound no guard   Data Reviewed: personally reviewed   CBC    Component Value Date/Time   WBC 14.2 (H) 04/20/2021 0325   RBC 3.00 (L) 04/20/2021 0325   HGB 8.3 (L) 04/20/2021 0325   HGB 10.3 (L) 01/13/2021 0939   HGB 10.8 (L) 06/24/2020 1055   HCT 26.1 (L) 04/20/2021 0325   HCT 33.5 (L) 06/24/2020 1055   PLT 309 04/20/2021 0325   PLT 310 01/13/2021 0939   PLT 437 06/24/2020 1055   MCV 87.0 04/20/2021 0325   MCV 84 06/24/2020 1055   MCH 27.7 04/20/2021 0325   MCHC 31.8 04/20/2021 0325   RDW 14.1 04/20/2021 0325   RDW 14.1 06/24/2020 1055   LYMPHSABS 2.0 04/20/2021 0325   LYMPHSABS 3.3 (H) 06/24/2020 1055   MONOABS 0.8 04/20/2021 0325   EOSABS 0.4 04/20/2021 0325   EOSABS 0.3 06/24/2020 1055   BASOSABS 0.0 04/20/2021 0325   BASOSABS 0.1 06/24/2020 1055   CMP Latest Ref Rng & Units 04/20/2021 04/19/2021 04/18/2021  Glucose 70 - 99 mg/dL 99 78 94  BUN 6 -  20 mg/dL 26(H) 28(H) 30(H)  Creatinine 0.61 - 1.24 mg/dL 2.50(H) 2.74(H) 3.06(H)  Sodium 135 - 145 mmol/L 132(L) 137 137  Potassium 3.5 - 5.1 mmol/L 4.1 3.8 3.8  Chloride 98 - 111 mmol/L 106 110 109  CO2 22 - 32 mmol/L 21(L) 20(L) 19(L)  Calcium 8.9 - 10.3 mg/dL 7.8(L) 8.2(L) 8.3(L)  Total Protein 6.5 - 8.1 g/dL 6.8 7.0 7.5  Total Bilirubin 0.3 - 1.2 mg/dL 0.4 0.5 0.6  Alkaline Phos 38 - 126 U/L 60 60 63  AST 15 - 41 U/L 11(L) 14(L) 11(L)  ALT 0 - 44 U/L 9 17 17      Radiology Studies: ECHOCARDIOGRAM  COMPLETE  Result Date: 04/19/2021    ECHOCARDIOGRAM REPORT   Patient Name:   [name] Date of Exam: 04/19/2021 Medical Rec #:  [id number]        Height:       64.0 in Accession #:    [id number]       Weight:       196.0 lb Date of Birth:  Dec 29, 1970        BSA:          1.940 m Patient Age:    71 years         BP:           136/85 mmHg Patient Gender: M                HR:           89 bpm. Exam Location:  Inpatient Procedure: 2D Echo, Cardiac Doppler and Color Doppler Indications:    BACTEREMIA  History:        Patient has prior history of Echocardiogram examinations, most                 recent 03/31/2020. CAD; Risk Factors:Diabetes and Hypertension.                 HLD.  Sonographer:    Beryle Beams Referring Phys: [id number] [place]  1. Left ventricular ejection fraction, by estimation, is 60 to 65%. The left ventricle has normal function. The left ventricle has no regional wall motion abnormalities. Left ventricular diastolic parameters were normal.  2. Right ventricular systolic function is normal. The right ventricular size is normal.  3. Trivial effuson persists since 03/31/21 not circumferential. The pericardial effusion is posterior to the left ventricle.  4. The mitral valve is abnormal. Trivial mitral valve regurgitation. No evidence of mitral stenosis.  5. The aortic valve was not well visualized. There is mild calcification of the aortic valve. There is mild thickening of the aortic valve. Aortic valve regurgitation is not visualized. Aortic valve sclerosis is present, with no evidence of aortic valve  stenosis.  6. The inferior vena cava is normal in size with greater than 50% respiratory variability, suggesting right atrial pressure of 3 mmHg. FINDINGS  Left Ventricle: Left ventricular ejection fraction, by estimation, is 60 to 65%. The left ventricle has normal function. The left ventricle has no regional wall motion abnormalities. The left ventricular internal cavity size was  normal in size. There is  no left ventricular hypertrophy. Left ventricular diastolic parameters were normal. Right Ventricle: The right ventricular size is normal. No increase in right ventricular wall thickness. Right ventricular systolic function is normal. Left Atrium: Left atrial size was normal in size. Right Atrium: Right atrial size was normal in size. Pericardium: Trivial effuson persists since 03/31/21 not circumferential. Trivial pericardial  effusion is present. The pericardial effusion is posterior to the left ventricle. Mitral Valve: The mitral valve is abnormal. There is mild thickening of the mitral valve leaflet(s). Trivial mitral valve regurgitation. No evidence of mitral valve stenosis. Tricuspid Valve: The tricuspid valve is normal in structure. Tricuspid valve regurgitation is not demonstrated. No evidence of tricuspid stenosis. Aortic Valve: The aortic valve was not well visualized. There is mild calcification of the aortic valve. There is mild thickening of the aortic valve. Aortic valve regurgitation is not visualized. Aortic valve sclerosis is present, with no evidence of aortic valve stenosis. Aortic valve mean gradient measures 4.0 mmHg. Aortic valve peak gradient measures 7.8 mmHg. Aortic valve area, by VTI measures 1.78 cm. Pulmonic Valve: The pulmonic valve was normal in structure. Pulmonic valve regurgitation is not visualized. No evidence of pulmonic stenosis. Aorta: The aortic root is normal in size and structure. Venous: The inferior vena cava is normal in size with greater than 50% respiratory variability, suggesting right atrial pressure of 3 mmHg. IAS/Shunts: No atrial level shunt detected by color flow Doppler.  LEFT VENTRICLE PLAX 2D LVIDd:         3.90 cm     Diastology LVIDs:         2.60 cm     LV e' medial:    8.92 cm/s LV PW:         1.00 cm     LV E/e' medial:  10.5 LV IVS:        0.80 cm     LV e' lateral:   10.90 cm/s LVOT diam:     1.80 cm     LV E/e' lateral: 8.6 LV SV:          46 LV SV Index:   24 LVOT Area:     2.54 cm  LV Volumes (MOD) LV vol d, MOD A2C: 76.2 ml LV vol d, MOD A4C: 63.9 ml LV vol s, MOD A2C: 32.6 ml LV vol s, MOD A4C: 25.7 ml LV SV MOD A2C:     43.6 ml LV SV MOD A4C:     63.9 ml LV SV MOD BP:      39.1 ml RIGHT VENTRICLE RV S prime:     13.20 cm/s TAPSE (M-mode): 2.0 cm LEFT ATRIUM             Index        RIGHT ATRIUM           Index LA diam:        3.10 cm 1.60 cm/m   RA Area:     11.80 cm LA Vol (A2C):   60.7 ml 31.29 ml/m  RA Volume:   27.50 ml  14.18 ml/m LA Vol (A4C):   38.2 ml 19.69 ml/m LA Biplane Vol: 49.9 ml 25.73 ml/m  AORTIC VALVE                    PULMONIC VALVE AV Area (Vmax):    1.76 cm     PV Vmax:       0.81 m/s AV Area (Vmean):   1.67 cm     PV Vmean:      55.500 cm/s AV Area (VTI):     1.78 cm     PV VTI:        0.184 m AV Vmax:           140.00 cm/s  PV Peak grad:  2.6 mmHg AV Vmean:  95.000 cm/s  PV Mean grad:  1.0 mmHg AV VTI:            0.260 m AV Peak Grad:      7.8 mmHg AV Mean Grad:      4.0 mmHg LVOT Vmax:         96.80 cm/s LVOT Vmean:        62.400 cm/s LVOT VTI:          0.182 m LVOT/AV VTI ratio: 0.70  AORTA Ao Root diam: 2.70 cm Ao Asc diam:  2.50 cm MITRAL VALVE MV Area (PHT): 3.99 cm     SHUNTS MV Decel Time: 190 msec     Systemic VTI:  0.18 m MV E velocity: 93.33 cm/s   Systemic Diam: 1.80 cm MV A velocity: 118.00 cm/s MV E/A ratio:  0.79 Jenkins Rouge MD Electronically signed by Jenkins Rouge MD Signature Date/Time: 04/19/2021/11:54:23 AM    Final      Scheduled Meds:  amLODipine  10 mg Oral Daily   aspirin EC  81 mg Oral q morning   atorvastatin  20 mg Oral Daily   carvedilol  25 mg Oral BID   cloNIDine  0.1 mg Oral BID   ferrous sulfate  325 mg Oral Q breakfast   gabapentin  300 mg Oral TID   insulin aspart  0-9 Units Subcutaneous Q4H   ondansetron (ZOFRAN) IV  4 mg Intravenous Q6H   Continuous Infusions:   ceFAZolin (ANCEF) IV Stopped (04/20/21 0957)   dextrose 100 mL/hr at 04/20/21 1126      LOS: 3 days   Time spent: De [place]aca, MD [facility] To contact the attending provider between 7A-7P or the covering provider during after hours 7P-7A, please log into the web site [email or web address] and access using universal [place] password for that web site. If you do not have the password, please call the hospital operator.  04/20/2021, 2:36 PM

## 2021-04-20 NOTE — Progress Notes (Signed)
Orthopedic Tech Progress Note Patient Details:  [name] 01/11/1971 [id number]  Ortho Devices Type of Ortho Device: CAM walker Ortho Device/Splint Location: right Ortho Device/Splint Interventions: Application   Post Interventions Patient Tolerated: Well Instructions Provided: Care of device  [name] 04/20/2021, 9:24 AM

## 2021-04-20 NOTE — Plan of Care (Signed)
Problem: Education: Goal: Knowledge of General Education information will improve Description: Including pain rating scale, medication(s)/side effects and non-pharmacologic comfort measures Outcome: Progressing   Problem: Activity: Goal: Risk for activity intolerance will decrease Outcome: Progressing   Problem: Pain Managment: Goal: General experience of comfort will improve Outcome: Dante, RN 04/20/21 9:08 AM

## 2021-04-20 NOTE — Care Management Important Message (Signed)
Important Message  Patient Details IM Letter given to the Patient. Name: [name] MRN: [id number] Date of Birth: [birth year]   Medicare Important Message Given:  Yes     Kerin Salen 04/20/2021, 2:05 PM

## 2021-04-21 ENCOUNTER — Inpatient Hospital Stay: Payer: 59

## 2021-04-21 ENCOUNTER — Ambulatory Visit: Payer: Self-pay | Admitting: Podiatry

## 2021-04-21 ENCOUNTER — Inpatient Hospital Stay: Payer: 59 | Admitting: Physician Assistant

## 2021-04-21 ENCOUNTER — Encounter (HOSPITAL_BASED_OUTPATIENT_CLINIC_OR_DEPARTMENT_OTHER): Payer: 59 | Admitting: Internal Medicine

## 2021-04-21 DIAGNOSIS — R6520 Severe sepsis without septic shock: Secondary | ICD-10-CM

## 2021-04-21 DIAGNOSIS — A419 Sepsis, unspecified organism: Secondary | ICD-10-CM

## 2021-04-21 DIAGNOSIS — N1832 Chronic kidney disease, stage 3b: Secondary | ICD-10-CM | POA: Diagnosis not present

## 2021-04-21 DIAGNOSIS — N179 Acute kidney failure, unspecified: Secondary | ICD-10-CM | POA: Diagnosis not present

## 2021-04-21 DIAGNOSIS — I10 Essential (primary) hypertension: Secondary | ICD-10-CM | POA: Diagnosis not present

## 2021-04-21 DIAGNOSIS — D631 Anemia in chronic kidney disease: Secondary | ICD-10-CM

## 2021-04-21 LAB — RENAL FUNCTION PANEL
Albumin: 2.4 g/dL — ABNORMAL LOW (ref 3.5–5.0)
Anion gap: 8 (ref 5–15)
BUN: 21 mg/dL — ABNORMAL HIGH (ref 6–20)
CO2: 22 mmol/L (ref 22–32)
Calcium: 8.1 mg/dL — ABNORMAL LOW (ref 8.9–10.3)
Chloride: 104 mmol/L (ref 98–111)
Creatinine, Ser: 2.66 mg/dL — ABNORMAL HIGH (ref 0.61–1.24)
GFR, Estimated: 28 mL/min — ABNORMAL LOW (ref >60)
Glucose, Bld: 125 mg/dL — ABNORMAL HIGH (ref 70–99)
Phosphorus: 3.7 mg/dL (ref 2.5–4.6)
Potassium: 4 mmol/L (ref 3.5–5.1)
Sodium: 134 mmol/L — ABNORMAL LOW (ref 135–145)

## 2021-04-21 LAB — GLUCOSE, CAPILLARY
Glucose-Capillary: 100 mg/dL — ABNORMAL HIGH (ref 70–99)
Glucose-Capillary: 114 mg/dL — ABNORMAL HIGH (ref 70–99)
Glucose-Capillary: 124 mg/dL — ABNORMAL HIGH (ref 70–99)
Glucose-Capillary: 125 mg/dL — ABNORMAL HIGH (ref 70–99)
Glucose-Capillary: 129 mg/dL — ABNORMAL HIGH (ref 70–99)
Glucose-Capillary: 138 mg/dL — ABNORMAL HIGH (ref 70–99)
Glucose-Capillary: 179 mg/dL — ABNORMAL HIGH (ref 70–99)

## 2021-04-21 MED ORDER — ENSURE MAX PROTEIN PO LIQD
11.0000 [oz_av] | Freq: Two times a day (BID) | ORAL | Status: DC
  Administered 2021-04-21 – 2021-04-28 (×10): 11 [oz_av] via ORAL
  Filled 2021-04-21 (×6): qty 330

## 2021-04-21 MED ORDER — ASCORBIC ACID 500 MG PO TABS
500.0000 mg | ORAL_TABLET | Freq: Every day | ORAL | Status: DC
  Administered 2021-04-21 – 2021-04-28 (×7): 500 mg via ORAL
  Filled 2021-04-21 (×7): qty 1

## 2021-04-21 MED ORDER — ADULT MULTIVITAMIN W/MINERALS CH
1.0000 | ORAL_TABLET | Freq: Every day | ORAL | Status: DC
  Administered 2021-04-21 – 2021-04-28 (×7): 1 via ORAL
  Filled 2021-04-21 (×7): qty 1

## 2021-04-21 NOTE — Discharge Instructions (Signed)
Carbohydrate Counting For People With Diabetes ° °Foods with carbohydrates make your blood glucose level go up. Learning how to count carbohydrates can help you control your blood glucose levels. First, identify the foods you eat that contain carbohydrates. Then, using the Foods with Carbohydrates chart, determine about how much carbohydrates are in your meals and snacks. Make sure you are eating foods with fiber, protein, and healthy fat along with your carbohydrate foods. °Foods with Carbohydrates °The following table shows carbohydrate foods that have about 15 grams of carbohydrate each. Using measuring cups, spoons, or a food scale when you first begin learning about carbohydrate counting can help you learn about the portion sizes you typically eat. °The following foods have 15 grams carbohydrate each:  °Grains °1 slice bread (1 ounce)  °1 small tortilla (6-inch size)  °¼ large bagel (1 ounce)  °1/3 cup pasta or rice (cooked)  °½ hamburger or hot dog bun (¾ ounce)  °½ cup cooked cereal  °½ to ¾ cup ready-to-eat cereal  °2 taco shells (5-inch size) Fruit °1 small fresh fruit (¾ to 1 cup)  °½ medium banana  °17 small grapes (3 ounces)  °1 cup melon or berries  °½ cup canned or frozen fruit  °2 tablespoons dried fruit (blueberries, cherries, cranberries, raisins)  °½ cup unsweetened fruit juice  °Starchy Vegetables °½ cup cooked beans, peas, corn, potatoes/sweet potatoes  °¼ large baked potato (3 ounces)  °1 cup acorn or butternut squash  Snack Foods °3 to 6 crackers  °8 potato chips or 13 tortilla chips (¾ ounce to 1 ounce)  °3 cups popped popcorn  °Dairy °3/4 cup (6 ounces) nonfat plain yogurt, or yogurt with sugar-free sweetener  °1 cup milk  °1 cup plain rice, soy, coconut or flavored almond milk Sweets and Desserts °½ cup ice cream or frozen yogurt  °1 tablespoon jam, jelly, pancake syrup, table sugar, or honey  °2 tablespoons light pancake syrup  °1 inch square of frosted cake or 2 inch square of unfrosted  cake  °2 small cookies (2/3 ounce each) or ¼ large cookie  °Sometimes you’ll have to estimate carbohydrate amounts if you don’t know the exact recipe. One cup of mixed foods like soups can have 1 to 2 carbohydrate servings, while some casseroles might have 2 or more servings of carbohydrate. °Foods that have less than 20 calories in each serving can be counted as “free” foods. Count 1 cup raw vegetables, or ½ cup cooked non-starchy vegetables as “free” foods. If you eat 3 or more servings at one meal, then count them as 1 carbohydrate serving.  °Foods without Carbohydrates  °Not all foods contain carbohydrates. Meat, some dairy, fats, non-starchy vegetables, and many beverages don’t contain carbohydrate. So when you count carbohydrates, you can generally exclude chicken, pork, beef, fish, seafood, eggs, tofu, cheese, butter, sour cream, avocado, nuts, seeds, olives, mayonnaise, water, black coffee, unsweetened tea, and zero-calorie drinks. Vegetables with no or low carbohydrate include green beans, cauliflower, tomatoes, and onions. °How much carbohydrate should I eat at each meal?  °Carbohydrate counting can help you plan your meals and manage your weight. Following are some starting points for carbohydrate intake at each meal. Work with your registered dietitian nutritionist to find the best range that works for your blood glucose and weight.  ° To Lose Weight To Maintain Weight  °Women 2 - 3 carb servings 3 - 4 carb servings  °Men 3 - 4 carb servings 4 - 5 carb servings  °Checking your   blood glucose after meals will help you know if you need to adjust the timing, type, or number of carbohydrate servings in your meal plan. Achieve and keep a healthy body weight by balancing your food intake and physical activity. ° °Tips °How should I plan my meals?  °Plan for half the food on your plate to include non-starchy vegetables, like salad greens, broccoli, or carrots. Try to eat 3 to 5 servings of non-starchy vegetables  every day. Have a protein food at each meal. Protein foods include chicken, fish, meat, eggs, or beans (note that beans contain carbohydrate). These two food groups (non-starchy vegetables and proteins) are low in carbohydrate. If you fill up your plate with these foods, you will eat less carbohydrate but still fill up your stomach. Try to limit your carbohydrate portion to ¼ of the plate.  °What fats are healthiest to eat?  °Diabetes increases risk for heart disease. To help protect your heart, eat more healthy fats, such as olive oil, nuts, and avocado. Eat less saturated fats like butter, cream, and high-fat meats, like bacon and sausage. Avoid trans fats, which are in all foods that list “partially hydrogenated oil” as an ingredient. °What should I drink?  °Choose drinks that are not sweetened with sugar. The healthiest choices are water, carbonated or seltzer waters, and tea and coffee without added sugars.  °Sweet drinks will make your blood glucose go up very quickly. One serving of soda or energy drink is ½ cup. It is best to drink these beverages only if your blood glucose is low.  °Artificially sweetened, or diet drinks, typically do not increase your blood glucose if they have zero calories in them. Read labels of beverages, as some diet drinks do have carbohydrate and will raise your blood glucose. °Label Reading Tips °Read Nutrition Facts labels to find out how many grams of carbohydrate are in a food you want to eat. Don’t forget: sometimes serving sizes on the label aren’t the same as how much food you are going to eat, so you may need to calculate how much carbohydrate is in the food you are serving yourself.  ° °Carbohydrate Counting for People with Diabetes Sample 1-Day Menu  °Breakfast ¾ cup yogurt, low fat, low sugar (1 carbohydrate serving)  °½ cup cereal, ready-to-eat, unsweetened (1 carbohydrate serving)  °1 cup strawberries (1 carbohydrate serving)  °¼ cup almonds (½ carbohydrate serving)   °Lunch 1, 5 ounce can chunk light tuna  °2 ounces cheese, low fat cheddar  °6 whole wheat crackers (1 carbohydrate serving)  °1 small apple (1½ carbohydrate servings)  °½ cup carrots (½ carbohydrate serving)  °½ cup snap peas  °1 cup 1% milk (1 carbohydrate serving)   °Evening Meal Stir fry made with: 3 ounces chicken  °1 cup brown rice (3 carbohydrate servings)  °½ cup broccoli (½ carbohydrate serving)  °½ cup green beans  °¼ cup onions  °1 tablespoon olive oil  °2 tablespoons teriyaki sauce (½ carbohydrate serving)  °Evening Snack 1 extra small banana (1 carbohydrate serving)  °1 tablespoon peanut butter  ° °Carbohydrate Counting for People with Diabetes Vegan Sample 1-Day Menu  °Breakfast 1 cup cooked oatmeal (2 carbohydrate servings)  °½ cup blueberries (1 carbohydrate serving)  °2 tablespoons flaxseeds  °1 cup soymilk fortified with calcium and vitamin D  °1 cup coffee  °Lunch 2 slices whole wheat bread (2 carbohydrate servings)  °½ cup baked tofu  °¼ cup lettuce  °2 slices tomato  °2 slices avocado  °½   cup baby carrots (½ carbohydrate serving)  °1 orange (1 carbohydrate serving)  °1 cup soymilk fortified with calcium and vitamin D   °Evening Meal Burrito made with: 1 6-inch corn tortilla (1 carbohydrate serving)  °1 cup refried vegetarian beans (2 carbohydrate servings)  °¼ cup chopped tomatoes  °¼ cup lettuce  °¼ cup salsa  °1/3 cup brown rice (1 carbohydrate serving)  °1 tablespoon olive oil for rice  °½ cup zucchini   °Evening Snack 6 small whole grain crackers (1 carbohydrate serving)  °2 apricots (½ carbohydrate serving)  °¼ cup unsalted peanuts (½ carbohydrate serving)   ° °Carbohydrate Counting for People with Diabetes Vegetarian (Lacto-Ovo) Sample 1-Day Menu  °Breakfast 1 cup cooked oatmeal (2 carbohydrate servings)  °½ cup blueberries (1 carbohydrate serving)  °2 tablespoons flaxseeds  °1 egg  °1 cup 1% milk (1 carbohydrate serving)  °1 cup coffee  °Lunch 2 slices whole wheat bread (2 carbohydrate  servings)  °2 ounces low-fat cheese  °¼ cup lettuce  °2 slices tomato  °2 slices avocado  °½ cup baby carrots (½ carbohydrate serving)  °1 orange (1 carbohydrate serving)  °1 cup unsweetened tea  °Evening Meal Burrito made with: 1 6-inch corn tortilla (1 carbohydrate serving)  °½ cup refried vegetarian beans (1 carbohydrate serving)  °¼ cup tomatoes  °¼ cup lettuce  °¼ cup salsa  °1/3 cup brown rice (1 carbohydrate serving)  °1 tablespoon olive oil for rice  °½ cup zucchini  °1 cup 1% milk (1 carbohydrate serving)  °Evening Snack 6 small whole grain crackers (1 carbohydrate serving)  °2 apricots (½ carbohydrate serving)  °¼ cup unsalted peanuts (½ carbohydrate serving)   ° °Copyright 2020 © Academy of [employer] and [employer]etics. All rights reserved. ° °Using Nutrition Labels: Carbohydrate ° °Serving Size  °Look at the serving size. All the information on the label is based on this portion. °Servings Per Container  °The number of servings contained in the package. °Guidelines for Carbohydrate  °Look at the total grams of carbohydrate in the serving size.  °1 carbohydrate choice = 15 grams of carbohydrate. °Range of Carbohydrate Grams Per Choice  °Carbohydrate Grams/Choice Carbohydrate Choices  °6-10 ½  °11-20 1  °21-25 1½  °26-35 2  °36-40 2½  °41-50 3  °51-55 3½  °56-65 4  °66-70 4½  °71-80 5  ° ° °Copyright 2020 © Academy of [employer] and Dietetics. All rights reserved.  ° °Plate Method for Diabetes  ° °Foods with carbohydrates make your blood glucose level go up. The plate method is a simple way to meal plan and control the amount of carbohydrate you eat. °    °    °Use the following guidance to build a healthy plate to control carbohydrates. Divide a 9-inch plate into 3 sections, and consider your beverage the 4th section of your meal: °Food Group Examples of Foods/Beverages for This Section of your Meal  °Section 1: Non-starchy vegetables °Fill ½ of your plate to include non-starchy vegetables Asparagus, broccoli,  brussels sprouts, cabbage, carrots, cauliflower, celery, cucumber, green beans, mushrooms, peppers, salad greens, tomatoes, or zucchini.  °Section 2: Protein foods °Fill ¼ of your plate to include a lean protein Lean meat, poultry, fish, seafood, cheese, eggs, lean deli meat, tofu, beans, lentils, nuts or nut butters.  °Section 3: Carbohydrate foods °Fill ¼ of your plate to include carbohydrate foods Whole grains, whole wheat bread, brown rice, whole grain pasta, polenta, corn tortillas, fruit, or starchy vegetables (potatoes, green peas, corn, beans,   acorn squash, and butternut squash). One cup of milk also counts as a food that contains carbohydrate.  Section 4: Beverage Choose water or a low-calorie drink for your beverage. Unsweetened tea, coffee, or flavored/sparkling water without added sugar.  Image reprinted with permission from The American Diabetes Association.  Copyright 2022 by the American Diabetes Association.   Tips for Adding Fiber to Your Eating Plan Slowly increase the amount of fiber that you eat. Over the span of a few days, you should increase fiber by no more than 5 grams (g) until you reach a goal of 25 to 35 grams per day. Eat whole grain breads and cereals. Look for choices with 100% whole wheat, rye, oats, or bran as the first or second ingredient.  Have brown or wild rice instead of white rice or potatoes.  Enjoy a variety of grains. Grains higher in fiber include barley, oats, farro, kamut, and quinoa.  Bake with whole wheat flour. You can use it to replace some white or all-purpose flour in recipes. Enjoy baked beans more often! Add dried beans and peas to casseroles or soups.  Choose fresh fruit and vegetables instead of juices.  Eat fruits and vegetables with peels or skins on.  Compare food labels of similar foods to find higher-fiber choices. Packaged foods have the amount of fiber per serving listed on the Nutrition Facts label. Drink plenty of fluids. Set a goal of  at least 8 cups per day. You may need even more with higher amounts of fiber. Fluid helps your body process fiber without discomfort.   The following may help relieve constipation:  Drink a hot beverage or eat hot cereal first thing in the morning. Add unprocessed bran to foods. Start with 1 teaspoon bran added to cereal. Add flaxseed to foods. Start with 1 tablespoon ground flaxseed or flaxmeal added to cereal or applesauce. Eat a few dried or stewed prunes or drink prune juice. Slowly increase the amount of fiber that you eat. Over the span of a few days, you should increase fiber by no more than 5 grams (g). Talk with your health care team about the use of stool softeners and laxatives.  Copyright 2022  Academy of Nutrition and Dietetics. All rights reserved

## 2021-04-21 NOTE — H&P (View-Only) (Signed)
°  Subjective:  Patient ID: [name], male    DOB: [birth year],  MRN: [id number]  Patient seen bedside. Pain controlled. Denies complaints at present. Objective:   Vitals:   04/21/21 0453 04/21/21 0810  BP: 126/71 136/82  Pulse: 88 85  Resp: 17 17  Temp: 97.8 F (36.6 C) 98 F (36.7 C)  SpO2: 99% 100%   General AA&O x3. Normal mood and affect.  Vascular Foot warm to touch  Neurologic Epicritic sensation grossly diminished.  Dermatologic Surgical site well appearing - no wamth, erythema, skin edges viable. Wound bed soft, no e/o hematoma. No pain with calf compression. Minimal edema. No active bleeding or draiange.  Orthopedic: +Motor to residual foot   Assessment & Plan:  Patient was evaluated and treated and all questions answered.  RLE osteomyelitis s/p Chopart amputation -Dressing changed. Wound healing well. Continue dressing changes PRN strikethrough. -NWB RLE -Labs reviewed. No WBC from today, initially decreased but likely post-surgical increase yesterday. Will trend. -Continue empiric abx. Believed surgical cure of OM.  -Plan for RTOR tomorrow for wound closure and TendoAchilles lengthening. Likely ok for d/c Thursday if cleared by PT. -NPO after midnight.  Will continue to follow    [name], DPM  Accessible via secure chat for questions or concerns.

## 2021-04-21 NOTE — Plan of Care (Signed)
°  Problem: Education: Goal: Knowledge of General Education information will improve Description: Including pain rating scale, medication(s)/side effects and non-pharmacologic comfort measures Outcome: Progressing   Problem: Health Behavior/Discharge Planning: Goal: Ability to manage health-related needs will improve Outcome: Progressing   Problem: Clinical Measurements: Goal: Ability to maintain clinical measurements within normal limits will improve Outcome: Progressing   Problem: Activity: Goal: Risk for activity intolerance will decrease Outcome: Progressing   Problem: Coping: Goal: Level of anxiety will decrease Outcome: Progressing   Problem: Elimination: Goal: Will not experience complications related to bowel motility Outcome: Progressing   Problem: Pain Managment: Goal: General experience of comfort will improve Outcome: Progressing   Problem: Safety: Goal: Ability to remain free from injury will improve Outcome: Progressing   Problem: Skin Integrity: Goal: Risk for impaired skin integrity will decrease Outcome: Progressing   

## 2021-04-21 NOTE — Progress Notes (Signed)
PT Cancellation Note  Patient Details Name: [name] MRN: [id number] DOB: [birth year]   Cancelled Treatment:    Reason Eval/Treat Not Completed: Other (comment) (Pt declining PT today per RN. Pt is to go back to OR tomorrow for wound closure and TendoAchilles lengthening. Will follow up as schedule allows.)   Verner Mould, DPT Acute Rehabilitation Services Office [phone] Pager [phone]

## 2021-04-21 NOTE — Progress Notes (Signed)
Initial Nutrition Assessment  DOCUMENTATION CODES:   Obesity unspecified  INTERVENTION:   -Ensure MAX Protein po BID, each supplement provides 150 kcal and 30 grams of protein   -Multivitamin with minerals daily  -500 mg Vitamin C daily  -Placed "Carbohydrate Counting", "Plate Method" and "Constipation nutrition therapy" handouts in discharge instructions   NUTRITION DIAGNOSIS:   Increased nutrient needs related to wound healing as evidenced by estimated needs.  GOAL:   Patient will meet greater than or equal to 90% of their needs  MONITOR:   PO intake, Supplement acceptance, I & O's, Labs, Weight trends, Skin  REASON FOR ASSESSMENT:   Consult Assessment of nutrition requirement/status  ASSESSMENT:   51 yo male with diabetes mellitus and recurrent/chronic diabetic right foot infection.  1/22: s/p RIGHT FOOT CHOPART AMPUTATION (Right)  Patient lying in bed. States  his appetite has been poor. He is mainly consuming fruits.  We discussed diet to help with the healing of his foot. Pt is scheduled for another procedure tomorrow (wound closure). Answered pt's diet related questions. We discussed fiber and foods to help with constipation. Will place handouts in discharge instructions. Pt very appreciative of information.  Per weight records, pt's weight has increased. Pt states his weight tends to fluctuate.  Medications: Ferrous sulfate, Zofran, D5 infusion  Labs reviewed:  CBGs: 114-129 Low Na  NUTRITION - FOCUSED PHYSICAL EXAM:  No depletions noted.  Diet Order:   Diet Order             Diet Carb Modified Fluid consistency: Thin; Room service appropriate? Yes  Diet effective now                   EDUCATION NEEDS:   Education needs have been addressed  Skin:  Skin Assessment: Skin Integrity Issues: Skin Integrity Issues:: Incisions Incisions: 1/22 right foot  Last BM:  1/21  Height:   Ht Readings from Last 1 Encounters:  04/17/21 5\' 4"   (1.626 m)    Weight:   Wt Readings from Last 1 Encounters:  04/17/21 88.9 kg    BMI:  Body mass index is 33.64 kg/m.  Estimated Nutritional Needs:   Kcal:  1700-1900  Protein:  80-95g  Fluid:  1.9L/day  Clayton Bibles, MS, RD, LDN Inpatient Clinical Dietitian Contact information available via Amion

## 2021-04-21 NOTE — Progress Notes (Signed)
°  Subjective:  Patient ID: PRESTEN JOOST, male    DOB: 1970-05-27,  MRN: [id number]  Patient seen bedside. Pain controlled. Denies complaints at present. Objective:   Vitals:   04/21/21 0453 04/21/21 0810  BP: 126/71 136/82  Pulse: 88 85  Resp: 17 17  Temp: 97.8 F (36.6 C) 98 F (36.7 C)  SpO2: 99% 100%   General AA&O x3. Normal mood and affect.  Vascular Foot warm to touch  Neurologic Epicritic sensation grossly diminished.  Dermatologic Surgical site well appearing - no wamth, erythema, skin edges viable. Wound bed soft, no e/o hematoma. No pain with calf compression. Minimal edema. No active bleeding or draiange.  Orthopedic: +Motor to residual foot   Assessment & Plan:  Patient was evaluated and treated and all questions answered.  RLE osteomyelitis s/p Chopart amputation -Dressing changed. Wound healing well. Continue dressing changes PRN strikethrough. -NWB RLE -Labs reviewed. No WBC from today, initially decreased but likely post-surgical increase yesterday. Will trend. -Continue empiric abx. Believed surgical cure of OM.  -Plan for RTOR tomorrow for wound closure and TendoAchilles lengthening. Likely ok for d/c Thursday if cleared by PT. -NPO after midnight.  Will continue to follow    [name], DPM  Accessible via secure chat for questions or concerns.

## 2021-04-21 NOTE — Assessment & Plan Note (Addendum)
Baseline creatinine 2.4-2.6, and on presentation >3.  Improved back to 2.5-2.6 today with fluids, antibiotics, amputation.

## 2021-04-21 NOTE — Assessment & Plan Note (Signed)
Estimated body mass index is 33.58 kg/m as calculated from the following:   Height as of this encounter: 5' (1.524 m).   Weight as of this encounter: 78 kg.   -This will complicate overall prognosis 

## 2021-04-21 NOTE — Assessment & Plan Note (Signed)
Continue gabapentin.

## 2021-04-21 NOTE — Assessment & Plan Note (Signed)
See above

## 2021-04-21 NOTE — Hospital Course (Addendum)
[name] is a 52 y.o. 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  In the ER, Cr 3.2, leukocytosis.    1/20 Admitted on antibiotics 1/22: Underwent trans-tarsal amputation, blood cx+ for MSSA 1/25: Back to OR for final debridement   Now awaiting TEE

## 2021-04-21 NOTE — Progress Notes (Signed)
°  Progress Note   Patient: [name] YKD:[id number] DOB: June 20, 1970 DOA: 04/17/2021     4 DOS: the patient was seen and examined on 04/21/2021   Brief hospital course: Mr. Gust is a 51 y.o. 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  In the ER, Cr 3.2, leukocytosis.    1/20 Admitted on antibiotics 1/22: Underwent trans-tarsal amputation, blood cx+ for MSSA 1/24: TEE   Assessment and Plan * Severe sepsis ([place])- (present on admission) Presented with tachycardia, leukocytosis, and renal failure creatinine >3.  Admitted on vancomycin, and found to have wound infection of the right forefoot, cellulitis, and MSSA bacteremia.  S/p metatarsal amputation by Dr. March Rummage 1/22  ID consulted, narrowed to cefazolin  -Continue cefazolin  -Consult infectious disease -To the OR again for debridement by podiatry tomorrow - TEE Thursday  MSSA bacteremia See above  Acute kidney injury superimposed on CKD ([place]) Baseline creatinine 2.4-2.6, and on presentation >3.  Improved back to 2.5-2.6 today with fluids, antibiotics, amputation.  Mixed hyperlipidemia - Continue atorvastatin  Type 2 diabetes mellitus with hyperglycemia ([name]) Glucose controlled - Continue sliding scale corrections  Anemia in chronic kidney disease (CKD) Hemoglobin stable at 8.3 close to relative baseline  Obesity- (present on admission) BMI 33  Stage 3b chronic kidney disease (CKD) ([name])    Diabetic neuropathy ([place])- (present on admission) - Continue gabapentin  Essential hypertension Blood pressure normal - Continue amlodipine, carvedilol, clonidine     Subjective: Patient's pain is well controlled.  No fever, no confusion, no respiratory distress.  Appetite reduced.  Objective Vital signs were reviewed and unremarkable. Adult male, lying in bed, appears tired, no acute distress.   Attention normal, affect appropriate, judgment insight appear normal.  Heart rate regular, no murmurs, no lower extremity edema.  Normal respiratory rate and rhythm, lungs clear without rales or wheezes.  No redness or swelling of the right leg.  Wound bandaged.  Data Reviewed: Discussed with ID Reviewed labs and imaging is significant for creatinine up to 2.66, sodium 134, otherwise metabolic panel normal, complete blood count from yesterday shows leukocytosis, 14 K, improving, hemoglobin 8.3, stable relative to baseline.  Family Communication:   Disposition: Status is: Inpatient  Remains inpatient appropriate because: Patient has staph bacteremia, he will need further debridement of his right foot, TEE, and then possibly PICC line and prolonged course of antibiotics.    Optimistic he will be able to discharge by Thursday or Friday           Author: Edwin Dada, MD 04/21/2021 4:08 PM  For on call review [email or web address].

## 2021-04-21 NOTE — Assessment & Plan Note (Signed)
Glucose controlled - Continue sliding scale corrections 

## 2021-04-21 NOTE — Assessment & Plan Note (Addendum)
Presented with tachycardia, leukocytosis, and renal failure creatinine >3.  Admitted on vancomycin, and found to have wound infection of the right forefoot, cellulitis, and MSSA bacteremia.  S/p metatarsal amputation by Dr. March Rummage 1/22  ID consulted, narrowed to cefazolin  -Continue cefazolin  -Consult infectious disease -To the OR again for debridement by podiatry tomorrow - TEE Thursday

## 2021-04-21 NOTE — Assessment & Plan Note (Signed)
Hemoglobin stable at 8.3 close to relative baseline

## 2021-04-21 NOTE — Assessment & Plan Note (Signed)
Continue atorvastatin

## 2021-04-21 NOTE — Assessment & Plan Note (Signed)
Blood pressure normal - Continue amlodipine, carvedilol, clonidine

## 2021-04-22 ENCOUNTER — Inpatient Hospital Stay (HOSPITAL_COMMUNITY): Payer: 59 | Admitting: Anesthesiology

## 2021-04-22 ENCOUNTER — Encounter (HOSPITAL_BASED_OUTPATIENT_CLINIC_OR_DEPARTMENT_OTHER): Payer: 59 | Admitting: Physician Assistant

## 2021-04-22 ENCOUNTER — Other Ambulatory Visit (HOSPITAL_COMMUNITY): Payer: Self-pay

## 2021-04-22 ENCOUNTER — Encounter (HOSPITAL_COMMUNITY)
Admission: EM | Disposition: A | Discharge status: Home Health | Payer: Self-pay | Source: Home / Self Care | Attending: Family Medicine

## 2021-04-22 DIAGNOSIS — N1832 Chronic kidney disease, stage 3b: Secondary | ICD-10-CM | POA: Diagnosis not present

## 2021-04-22 DIAGNOSIS — R6520 Severe sepsis without septic shock: Secondary | ICD-10-CM | POA: Diagnosis not present

## 2021-04-22 DIAGNOSIS — N179 Acute kidney failure, unspecified: Secondary | ICD-10-CM | POA: Diagnosis not present

## 2021-04-22 DIAGNOSIS — I10 Essential (primary) hypertension: Secondary | ICD-10-CM | POA: Diagnosis not present

## 2021-04-22 DIAGNOSIS — A419 Sepsis, unspecified organism: Secondary | ICD-10-CM | POA: Diagnosis not present

## 2021-04-22 HISTORY — PX: WOUND DEBRIDEMENT: SHX247

## 2021-04-22 HISTORY — PX: TENDON LENGTHENING: SHX6786

## 2021-04-22 LAB — CBC
HCT: 27.3 % — ABNORMAL LOW (ref 39.0–52.0)
Hemoglobin: 8.2 g/dL — ABNORMAL LOW (ref 13.0–17.0)
MCH: 26.5 pg (ref 26.0–34.0)
MCHC: 30 g/dL (ref 30.0–36.0)
MCV: 88.1 fL (ref 80.0–100.0)
Platelets: 343 10*3/uL (ref 150–400)
RBC: 3.1 MIL/uL — ABNORMAL LOW (ref 4.22–5.81)
RDW: 14.1 % (ref 11.5–15.5)
WBC: 14.4 10*3/uL — ABNORMAL HIGH (ref 4.0–10.5)
nRBC: 0 % (ref 0.0–0.2)

## 2021-04-22 LAB — BASIC METABOLIC PANEL
Anion gap: 7 (ref 5–15)
BUN: 29 mg/dL — ABNORMAL HIGH (ref 6–20)
CO2: 21 mmol/L — ABNORMAL LOW (ref 22–32)
Calcium: 8.3 mg/dL — ABNORMAL LOW (ref 8.9–10.3)
Chloride: 107 mmol/L (ref 98–111)
Creatinine, Ser: 2.68 mg/dL — ABNORMAL HIGH (ref 0.61–1.24)
GFR, Estimated: 28 mL/min — ABNORMAL LOW (ref >60)
Glucose, Bld: 135 mg/dL — ABNORMAL HIGH (ref 70–99)
Potassium: 4.4 mmol/L (ref 3.5–5.1)
Sodium: 135 mmol/L (ref 135–145)

## 2021-04-22 LAB — GLUCOSE, CAPILLARY
Glucose-Capillary: 119 mg/dL — ABNORMAL HIGH (ref 70–99)
Glucose-Capillary: 123 mg/dL — ABNORMAL HIGH (ref 70–99)
Glucose-Capillary: 102 mg/dL — ABNORMAL HIGH (ref 70–99)
Glucose-Capillary: 92 mg/dL (ref 70–99)
Glucose-Capillary: 111 mg/dL — ABNORMAL HIGH (ref 70–99)
Glucose-Capillary: 175 mg/dL — ABNORMAL HIGH (ref 70–99)

## 2021-04-22 LAB — SURGICAL PATHOLOGY

## 2021-04-22 SURGERY — DEBRIDEMENT, WOUND
Anesthesia: Monitor Anesthesia Care | Laterality: Right

## 2021-04-22 MED ORDER — PROPOFOL 10 MG/ML IV BOLUS
INTRAVENOUS | Status: DC | PRN
  Administered 2021-04-22 (×3): 10 mg via INTRAVENOUS

## 2021-04-22 MED ORDER — FENTANYL CITRATE (PF) 100 MCG/2ML IJ SOLN
INTRAMUSCULAR | Status: AC
  Filled 2021-04-22: qty 2

## 2021-04-22 MED ORDER — PROPOFOL 1000 MG/100ML IV EMUL
INTRAVENOUS | Status: AC
  Filled 2021-04-22: qty 100

## 2021-04-22 MED ORDER — VANCOMYCIN HCL 1000 MG IV SOLR
INTRAVENOUS | Status: AC
  Filled 2021-04-22: qty 20

## 2021-04-22 MED ORDER — FENTANYL CITRATE (PF) 100 MCG/2ML IJ SOLN
INTRAMUSCULAR | Status: DC | PRN
Start: 2021-04-22 — End: 2021-04-22
  Administered 2021-04-22: 50 ug via INTRAVENOUS

## 2021-04-22 MED ORDER — BUPIVACAINE HCL (PF) 0.5 % IJ SOLN
INTRAMUSCULAR | Status: DC | PRN
  Administered 2021-04-22: 10 mL

## 2021-04-22 MED ORDER — LACTATED RINGERS IV SOLN: INTRAVENOUS | Status: DC

## 2021-04-22 MED ORDER — BUPIVACAINE HCL (PF) 0.5 % IJ SOLN
INTRAMUSCULAR | Status: AC
  Filled 2021-04-22: qty 30

## 2021-04-22 MED ORDER — VANCOMYCIN HCL 1000 MG IV SOLR
INTRAVENOUS | Status: DC | PRN
  Administered 2021-04-22: 1000 mg via TOPICAL

## 2021-04-22 MED ORDER — MIDAZOLAM HCL 2 MG/2ML IJ SOLN
INTRAMUSCULAR | Status: AC
  Filled 2021-04-22: qty 2

## 2021-04-22 MED ORDER — MIDAZOLAM HCL 5 MG/5ML IJ SOLN
INTRAMUSCULAR | Status: DC | PRN
  Administered 2021-04-22: 2 mg via INTRAVENOUS

## 2021-04-22 MED ORDER — SODIUM CHLORIDE 0.9 % IR SOLN
Status: DC | PRN
  Administered 2021-04-22: 3000 mL
  Administered 2021-04-22: 1000 mL

## 2021-04-22 MED ORDER — PROPOFOL 500 MG/50ML IV EMUL
INTRAVENOUS | Status: DC | PRN
  Administered 2021-04-22: 50 ug/kg/min via INTRAVENOUS

## 2021-04-22 MED ORDER — CHLORHEXIDINE GLUCONATE 0.12 % MT SOLN
15.0000 mL | Freq: Once | OROMUCOSAL | Status: AC
  Administered 2021-04-22: 12:00:00 15 mL via OROMUCOSAL

## 2021-04-22 SURGICAL SUPPLY — 74 items
APL PRP STRL LF DISP 70% ISPRP (MISCELLANEOUS) ×1
BAG COUNTER SPONGE SURGICOUNT (BAG) IMPLANT
BAG SPNG CNTER NS LX DISP (BAG)
BLADE HEX COATED 2.75 (ELECTRODE) ×3 IMPLANT
BLADE OSCILLATING/SAGITTAL (BLADE)
BLADE SURG 15 STRL LF DISP TIS (BLADE) ×2 IMPLANT
BLADE SURG 15 STRL SS (BLADE) ×2
BLADE SW THK.38XMED LNG THN (BLADE) IMPLANT
BNDG CMPR 9X4 STRL LF SNTH (GAUZE/BANDAGES/DRESSINGS) ×1
BNDG ELASTIC 3X5.8 VLCR STR LF (GAUZE/BANDAGES/DRESSINGS) ×3 IMPLANT
BNDG ELASTIC 4X5.8 VLCR STR LF (GAUZE/BANDAGES/DRESSINGS) IMPLANT
BNDG ESMARK 4X9 LF (GAUZE/BANDAGES/DRESSINGS) ×3 IMPLANT
BNDG GAUZE ELAST 4 BULKY (GAUZE/BANDAGES/DRESSINGS) ×3 IMPLANT
CANISTER PREVENA PLUS 150 (CANNISTER) ×1 IMPLANT
CHLORAPREP W/TINT 26 (MISCELLANEOUS) ×3 IMPLANT
COVER BACK TABLE 60X90IN (DRAPES) ×3 IMPLANT
CUFF TOURN SGL QUICK 18X4 (TOURNIQUET CUFF) ×1 IMPLANT
DRAPE EXTREMITY T 121X128X90 (DISPOSABLE) ×3 IMPLANT
DRAPE IMP U-DRAPE 54X76 (DRAPES) ×3 IMPLANT
DRAPE INCISE IOBAN 66X45 STRL (DRAPES) ×1 IMPLANT
DRAPE U-SHAPE 47X51 STRL (DRAPES) ×3 IMPLANT
DRESSING PREVENA PLUS CUSTOM (GAUZE/BANDAGES/DRESSINGS) IMPLANT
DRSG EMULSION OIL 3X3 NADH (GAUZE/BANDAGES/DRESSINGS) ×3 IMPLANT
DRSG PAD ABDOMINAL 8X10 ST (GAUZE/BANDAGES/DRESSINGS) IMPLANT
DRSG PREVENA PLUS CUSTOM (GAUZE/BANDAGES/DRESSINGS) ×2
ELECT REM PT RETURN 15FT ADLT (MISCELLANEOUS) ×3 IMPLANT
GAUZE 4X4 16PLY ~~LOC~~+RFID DBL (SPONGE) IMPLANT
GAUZE SPONGE 4X4 12PLY STRL (GAUZE/BANDAGES/DRESSINGS) ×3 IMPLANT
GAUZE XEROFORM 1X8 LF (GAUZE/BANDAGES/DRESSINGS) IMPLANT
GAUZE XEROFORM 5X9 LF (GAUZE/BANDAGES/DRESSINGS) ×1 IMPLANT
GLOVE SRG 8 PF TXTR STRL LF DI (GLOVE) ×2 IMPLANT
GLOVE SURG ENC MOIS LTX SZ7.5 (GLOVE) ×3 IMPLANT
GLOVE SURG NEOPR MICRO LF SZ8 (GLOVE) ×3 IMPLANT
GLOVE SURG UNDER POLY LF SZ8 (GLOVE) ×2
GOWN STRL REUS W/ TWL LRG LVL3 (GOWN DISPOSABLE) ×2 IMPLANT
GOWN STRL REUS W/TWL LRG LVL3 (GOWN DISPOSABLE) ×2
GOWN STRL REUS W/TWL XL LVL3 (GOWN DISPOSABLE) ×6 IMPLANT
HANDPIECE INTERPULSE COAX TIP (DISPOSABLE) ×2
JET LAVAGE IRRISEPT WOUND (IRRIGATION / IRRIGATOR) ×2
KIT BASIN OR (CUSTOM PROCEDURE TRAY) ×3 IMPLANT
KIT TURNOVER KIT A (KITS) IMPLANT
LAVAGE JET IRRISEPT WOUND (IRRIGATION / IRRIGATOR) IMPLANT
MANIFOLD NEPTUNE II (INSTRUMENTS) ×3 IMPLANT
MATRIX PURAPLY MZ 1000MG (Tissue) IMPLANT
NDL HYPO 25X1 1.5 SAFETY (NEEDLE) ×2 IMPLANT
NEEDLE HYPO 25X1 1.5 SAFETY (NEEDLE) ×2 IMPLANT
NS IRRIG 1000ML POUR BTL (IV SOLUTION) IMPLANT
PACK ORTHO EXTREMITY (CUSTOM PROCEDURE TRAY) ×3 IMPLANT
PAD CAST 4YDX4 CTTN HI CHSV (CAST SUPPLIES) IMPLANT
PADDING CAST ABS 4INX4YD NS (CAST SUPPLIES) ×1
PADDING CAST ABS COTTON 4X4 ST (CAST SUPPLIES) ×2 IMPLANT
PADDING CAST COTTON 4X4 STRL (CAST SUPPLIES) ×2
PENCIL SMOKE EVACUATOR (MISCELLANEOUS) IMPLANT
PURAPLY MZ 1000MG (Tissue) ×2 IMPLANT
SET HNDPC FAN SPRY TIP SCT (DISPOSABLE) IMPLANT
SET IRRIG Y TYPE TUR BLADDER L (SET/KITS/TRAYS/PACK) IMPLANT
SLEEVE SCD COMPRESS KNEE MED (STOCKING) ×3 IMPLANT
SPONGE SURGIFOAM ABS GEL 100 (HEMOSTASIS) IMPLANT
STAPLER VISISTAT 35W (STAPLE) ×3 IMPLANT
STOCKINETTE 8 INCH (MISCELLANEOUS) ×6 IMPLANT
SUT ETHILON 3 0 FSL (SUTURE) ×1 IMPLANT
SUT ETHILON 3 0 PS 1 (SUTURE) IMPLANT
SUT ETHILON 4 0 PS 2 18 (SUTURE) IMPLANT
SUT MNCRL AB 3-0 PS2 18 (SUTURE) ×1 IMPLANT
SUT MNCRL AB 4-0 PS2 18 (SUTURE) IMPLANT
SUT MON AB 5-0 PS2 18 (SUTURE) IMPLANT
SUT VIC AB 3-0 FS2 27 (SUTURE) ×3 IMPLANT
SUT VIC AB 4-0 PS2 18 (SUTURE) ×3 IMPLANT
SYR BULB EAR ULCER 3OZ GRN STR (SYRINGE) ×3 IMPLANT
SYR CONTROL 10ML LL (SYRINGE) ×3 IMPLANT
TOWEL OR 17X26 10 PK STRL BLUE (TOWEL DISPOSABLE) ×3 IMPLANT
UNDERPAD 30X36 HEAVY ABSORB (UNDERPADS AND DIAPERS) ×3 IMPLANT
YANKAUER SUCT BULB TIP 10FT TU (MISCELLANEOUS) ×1 IMPLANT
YANKAUER SUCT BULB TIP NO VENT (SUCTIONS) ×3 IMPLANT

## 2021-04-22 NOTE — TOC Benefit Eligibility Note (Signed)
Patient Teacher, English as a foreign language completed.    The patient is currently admitted and upon discharge could be taking linezolid (Zyvox) 600 mg tablets.  The current 30 day co-pay is, $0.00.   The patient is insured through [place] [place] Patient Advocate Specialist [place] Patient Advocate Team Direct Number: [phone]  Fax: [phone]

## 2021-04-22 NOTE — Anesthesia Postprocedure Evaluation (Signed)
Anesthesia Post Note  Patient: [name]  Procedure(s) Performed: DEBRIDEMENT WOUND (Right) TENDON LENGTHENING (Right)     Patient location during evaluation: PACU Anesthesia Type: MAC Level of consciousness: awake and alert, patient cooperative and oriented Pain management: pain level controlled Vital Signs Assessment: post-procedure vital signs reviewed and stable Respiratory status: spontaneous breathing, nonlabored ventilation and respiratory function stable Cardiovascular status: blood pressure returned to baseline and stable Postop Assessment: no apparent nausea or vomiting Anesthetic complications: no   No notable events documented.  Last Vitals:  Vitals:   04/22/21 1445 04/22/21 1500  BP: 124/76 127/86  Pulse: 82 78  Resp: 18 17  Temp:    SpO2: 100% 100%    Last Pain:  Vitals:   04/22/21 1445  TempSrc:   PainSc: 0-No pain                 Sharvi Mooneyhan,E. Poonam Woehrle

## 2021-04-22 NOTE — Plan of Care (Signed)

## 2021-04-22 NOTE — Assessment & Plan Note (Signed)
Glucose controlled - Continue sliding scale corrections -Hold home glargine and Trulicity  - Given renal function and euglycemia here, probable discharge without glargine

## 2021-04-22 NOTE — Assessment & Plan Note (Addendum)
°-   Outpatient follow-up with nephrology

## 2021-04-22 NOTE — Assessment & Plan Note (Signed)
Estimated body mass index is 33.58 kg/m as calculated from the following:   Height as of this encounter: 5' (1.524 m).   Weight as of this encounter: 78 kg.   -This will complicate overall prognosis 

## 2021-04-22 NOTE — Assessment & Plan Note (Signed)
Hemoglobin stable close to baseline

## 2021-04-22 NOTE — Assessment & Plan Note (Signed)
Continue atorvastatin

## 2021-04-22 NOTE — Assessment & Plan Note (Addendum)
Baseline creatinine 2.4-2.6, and on presentation >3.  Improved back to 2.5-2.6 with fluids, antibiotics, amputation.

## 2021-04-22 NOTE — Progress Notes (Signed)
[name] for Infectious Disease   Reason for visit: Follow up on bacteremia, osteomyelitis  Interval History: operative debridement closure planned today; WBC stable at 14.4; remains afebrile.  TEE scheduled for tomorrow Day 6 total antibiotics   Physical Exam: Constitutional:  Vitals:   04/22/21 0448 04/22/21 0938  BP: 133/79 (!) 144/84  Pulse: 84 79  Resp: 17 16  Temp: 97.7 F (36.5 C) 97.7 F (36.5 C)  SpO2: 100% 100%   patient appears in NAD Respiratory: Normal respiratory effort; CTA B Cardiovascular: RRR GI: soft, nt, nd  Review of Systems: Constitutional: negative for fevers and chills Gastrointestinal: negative for nausea and diarrhea  Lab Results  Component Value Date   WBC 14.4 (H) 04/22/2021   HGB 8.2 (L) 04/22/2021   HCT 27.3 (L) 04/22/2021   MCV 88.1 04/22/2021   PLT 343 04/22/2021    Lab Results  Component Value Date   CREATININE 2.68 (H) 04/22/2021   BUN 29 (H) 04/22/2021   NA 135 04/22/2021   K 4.4 04/22/2021   CL 107 04/22/2021   CO2 21 (L) 04/22/2021    Lab Results  Component Value Date   ALT 9 04/20/2021   AST 11 (L) 04/20/2021   ALKPHOS 60 04/20/2021     Microbiology: Recent Results (from the past 240 hour(s))  WOUND CULTURE     Status: Abnormal   Collection Time: 04/17/21  3:03 PM  Result Value Ref Range Status   MICRO NUMBER: [id number]  Final   SPECIMEN QUALITY: Adequate  Final   SOURCE: R FOOT  Final   STATUS: FINAL  Final   GRAM STAIN:   Final    No epithelial cells seen Few Polymorphonuclear leukocytes Few Gram positive cocci   ISOLATE 1: Staphylococcus aureus (A)  Final    Comment: Heavy growth of Staphylococcus aureus      Susceptibility   Staphylococcus aureus - AEROBIC CULT, GRAM STAIN POSITIVE 1    VANCOMYCIN 1 Sensitive     CIPROFLOXACIN <=0.5 Sensitive     CLINDAMYCIN <=0.25 Sensitive     LEVOFLOXACIN 0.25 Sensitive     ERYTHROMYCIN <=0.25 Sensitive     GENTAMICIN <=0.5 Sensitive     OXACILLIN* <=0.25  Sensitive      * Oxacillin susceptible staphylococci are susceptible to other penicillinase-stable penicillins (e.g., methicillin, nafcillin), beta- lactam/beta-lactamase inhibitor combinations, and cephems with staphylococcal indications, including cefazolin.     TETRACYCLINE <=1 Sensitive     TRIMETH/SULFA* <=10 Sensitive      * Oxacillin susceptible staphylococci are susceptible to other penicillinase-stable penicillins (e.g., methicillin, nafcillin), beta- lactam/beta-lactamase inhibitor combinations, and cephems with staphylococcal indications, including cefazolin. Legend: S = Susceptible  I = Intermediate R = Resistant  NS = Not susceptible * = Not tested  NR = Not reported **NN = See antimicrobic comments   Resp Panel by RT-PCR (Flu A&B, Covid) Nasopharyngeal Swab     Status: None   Collection Time: 04/17/21  5:17 PM   Specimen: Nasopharyngeal Swab; Nasopharyngeal(NP) swabs in vial transport medium  Result Value Ref Range Status   SARS Coronavirus 2 by RT PCR NEGATIVE NEGATIVE Final    Comment: (NOTE) SARS-CoV-2 target nucleic acids are NOT DETECTED.  The SARS-CoV-2 RNA is generally detectable in upper respiratory specimens during the acute phase of infection. The lowest concentration of SARS-CoV-2 viral copies this assay can detect is 138 copies/mL. A negative result does not preclude SARS-Cov-2 infection and should not be used as the sole basis for treatment  or other patient management decisions. A negative result may occur with  improper specimen collection/handling, submission of specimen other than nasopharyngeal swab, presence of viral mutation(s) within the areas targeted by this assay, and inadequate number of viral copies(<138 copies/mL). A negative result must be combined with clinical observations, patient history, and epidemiological information. The expected result is Negative.  Fact Sheet for Patients:  EntrepreneurPulse.com.au  Fact  Sheet for Healthcare Providers:  IncredibleEmployment.be  This test is no t yet approved or cleared by the [place] FDA and  has been authorized for detection and/or diagnosis of SARS-CoV-2 by FDA under an Emergency Use Authorization (EUA). This EUA will remain  in effect (meaning this test can be used) for the duration of the COVID-19 declaration under Section 564(b)(1) of the Act, 21 U.S.C.section 360bbb-3(b)(1), unless the authorization is terminated  or revoked sooner.       Influenza A by PCR NEGATIVE NEGATIVE Final   Influenza B by PCR NEGATIVE NEGATIVE Final    Comment: (NOTE) The Xpert Xpress SARS-CoV-2/FLU/RSV plus assay is intended as an aid in the diagnosis of influenza from Nasopharyngeal swab specimens and should not be used as a sole basis for treatment. Nasal washings and aspirates are unacceptable for Xpert Xpress SARS-CoV-2/FLU/RSV testing.  Fact Sheet for Patients: EntrepreneurPulse.com.au  Fact Sheet for Healthcare Providers: IncredibleEmployment.be  This test is not yet approved or cleared by the [place] FDA and has been authorized for detection and/or diagnosis of SARS-CoV-2 by FDA under an Emergency Use Authorization (EUA). This EUA will remain in effect (meaning this test can be used) for the duration of the COVID-19 declaration under Section 564(b)(1) of the Act, 21 U.S.C. section 360bbb-3(b)(1), unless the authorization is terminated or revoked.  Performed at [facility], [place] [address] [place], [place] [address]   Culture, blood (routine x 2)     Status: Abnormal   Collection Time: 04/17/21  5:17 PM   Specimen: Right Antecubital; Blood  Result Value Ref Range Status   Specimen Description   Final    RIGHT ANTECUBITAL Performed at [facility] [address], [place], [place] [address]    Special Requests   Final    BOTTLES DRAWN AEROBIC AND  ANAEROBIC Blood Culture results may not be optimal due to an inadequate volume of blood received in culture bottles Performed at [facility] [address] [place], [place] [address]    Culture  Setup Time   Final    GRAM POSITIVE COCCI IN CLUSTERS IN BOTH AEROBIC AND ANAEROBIC BOTTLES CRITICAL RESULT CALLED TO, READ BACK BY AND VERIFIED WITH: E,WILLIAMSON PHARMD@[email or web address]  04/18/21 EB Performed at [facility], [place] [name], [place], [place] [address]    Culture STAPHYLOCOCCUS AUREUS (A)  Final   Report Status 04/20/2021 FINAL  Final   Organism ID, Bacteria STAPHYLOCOCCUS AUREUS  Final      Susceptibility   Staphylococcus aureus - MIC*    CIPROFLOXACIN >=8 RESISTANT Resistant     ERYTHROMYCIN >=8 RESISTANT Resistant     GENTAMICIN <=0.5 SENSITIVE Sensitive     OXACILLIN 1 SENSITIVE Sensitive     TETRACYCLINE >=16 RESISTANT Resistant     VANCOMYCIN 1 SENSITIVE Sensitive     TRIMETH/SULFA <=10 SENSITIVE Sensitive     CLINDAMYCIN RESISTANT Resistant     RIFAMPIN <=0.5 SENSITIVE Sensitive     Inducible Clindamycin POSITIVE Resistant     * STAPHYLOCOCCUS AUREUS  Blood Culture ID Panel (Reflexed)  Status: Abnormal   Collection Time: 04/17/21  5:17 PM  Result Value Ref Range Status   Enterococcus faecalis NOT DETECTED NOT DETECTED Final   Enterococcus Faecium NOT DETECTED NOT DETECTED Final   Listeria monocytogenes NOT DETECTED NOT DETECTED Final   Staphylococcus species DETECTED (A) NOT DETECTED Final    Comment: CRITICAL RESULT CALLED TO, READ BACK BY AND VERIFIED WITH: E,WILLIAMSON PHARMD@1822  04/18/21 EB    Staphylococcus aureus (BCID) DETECTED (A) NOT DETECTED Final    Comment: CRITICAL RESULT CALLED TO, READ BACK BY AND VERIFIED WITH: E,WILLIAMSON PHARMD@1822  04/18/21 EB    Staphylococcus epidermidis NOT DETECTED NOT DETECTED Final   Staphylococcus lugdunensis NOT DETECTED NOT DETECTED Final   Streptococcus species NOT DETECTED NOT DETECTED Final    Streptococcus agalactiae NOT DETECTED NOT DETECTED Final   Streptococcus pneumoniae NOT DETECTED NOT DETECTED Final   Streptococcus pyogenes NOT DETECTED NOT DETECTED Final   A.calcoaceticus-baumannii NOT DETECTED NOT DETECTED Final   Bacteroides fragilis NOT DETECTED NOT DETECTED Final   Enterobacterales NOT DETECTED NOT DETECTED Final   Enterobacter cloacae complex NOT DETECTED NOT DETECTED Final   Escherichia coli NOT DETECTED NOT DETECTED Final   Klebsiella aerogenes NOT DETECTED NOT DETECTED Final   Klebsiella oxytoca NOT DETECTED NOT DETECTED Final   Klebsiella pneumoniae NOT DETECTED NOT DETECTED Final   Proteus species NOT DETECTED NOT DETECTED Final   Salmonella species NOT DETECTED NOT DETECTED Final   Serratia marcescens NOT DETECTED NOT DETECTED Final   Haemophilus influenzae NOT DETECTED NOT DETECTED Final   Neisseria meningitidis NOT DETECTED NOT DETECTED Final   Pseudomonas aeruginosa NOT DETECTED NOT DETECTED Final   Stenotrophomonas maltophilia NOT DETECTED NOT DETECTED Final   Candida albicans NOT DETECTED NOT DETECTED Final   Candida auris NOT DETECTED NOT DETECTED Final   Candida glabrata NOT DETECTED NOT DETECTED Final   Candida krusei NOT DETECTED NOT DETECTED Final   Candida parapsilosis NOT DETECTED NOT DETECTED Final   Candida tropicalis NOT DETECTED NOT DETECTED Final   Cryptococcus neoformans/gattii NOT DETECTED NOT DETECTED Final   Meth resistant mecA/C and MREJ NOT DETECTED NOT DETECTED Final    Comment: Performed at [facility], [address], [place], [place] [address]  Surgical PCR screen     Status: None   Collection Time: 04/18/21  5:49 AM   Specimen: Nasal Mucosa; Nasal Swab  Result Value Ref Range Status   MRSA, PCR NEGATIVE NEGATIVE Final   Staphylococcus aureus NEGATIVE NEGATIVE Final    Comment: (NOTE) The Xpert SA Assay (FDA approved for NASAL specimens in patients 51 years of age and older), is one component of a  comprehensive surveillance program. It is not intended to diagnose infection nor to guide or monitor treatment. Performed at [facility], [place] [address], [place], [place] [address]   Culture, blood (routine x 2)     Status: None (Preliminary result)   Collection Time: 04/21/21  3:32 AM   Specimen: BLOOD  Result Value Ref Range Status   Specimen Description   Final    BLOOD BLOOD LEFT ARM Performed at [facility] [address] [place], [place] [address]    Special Requests   Final    BOTTLES DRAWN AEROBIC ONLY Blood Culture adequate volume Performed at [facility] [address], [place],  [address]    Culture   Final    NO GROWTH 1 DAY Performed at [facility], [place],  [place] [address]    Report Status PENDING  Incomplete  Culture, blood (routine x 2)     Status: None (Preliminary result)   Collection Time: 04/21/21  3:32 AM   Specimen: BLOOD LEFT HAND  Result Value Ref Range Status   Specimen Description   Final    BLOOD LEFT HAND Performed at [facility], [place] [address] [place], [place] [address]    Special Requests   Final    BOTTLES DRAWN AEROBIC ONLY Blood Culture adequate volume Performed at [facility] [address] [place], [place] [address]    Culture   Final    NO GROWTH 1 DAY Performed at [facility], [place] [address], [place], [place] [address]    Report Status PENDING  Incomplete    Impression/Plan:  1. Bacteremia - repeat blood cultures yesterday remain ngtd.  4/4 blood cultures positive 04/17/21.  TEE scheduled for tomorrow.  If no vegetation noted, picc line will not be indicated and will take oral linezolid.   2.  Osteomyelitis - good source control and with wound closure today.  No further purulence noted.  Prolonged antibiotics noted indicated.  Will conitnue treatment as above, about 10 days of linezolid at discharge.    3.  Leukocytosis - 14.4 today, stable.  No fever.  Will continue to monitor.

## 2021-04-22 NOTE — Progress Notes (Signed)
PT Cancellation Note  Patient Details Name: [name] MRN: [id number] DOB: 06/28/1970   Cancelled Treatment:     Patient to return to OR today. Will follow up 04/23/2021.  [place] Pager [phone] Office [phone]  Claretha Cooper 04/22/2021, 6:53 AM

## 2021-04-22 NOTE — Interval H&P Note (Signed)
History and Physical Interval Note:  04/22/2021 1:10 PM  [name]  has presented today for surgery, with the diagnosis of Osteomyelitis, equinus.  The various methods of treatment have been discussed with the patient and family. After consideration of risks, benefits and other options for treatment, the patient has consented to  Procedure(s): DEBRIDEMENT WOUND (Right) TENDON LENGTHENING (Right) as a surgical intervention.  The patient's history has been reviewed, patient examined, no change in status, stable for surgery.  I have reviewed the patient's chart and labs.  Questions were answered to the patient's satisfaction.     [name]

## 2021-04-22 NOTE — Progress Notes (Signed)
°  Progress Note   Patient: [name] VQM:[id number] DOB: March 17, 1971 DOA: 04/17/2021     5 DOS: the patient was seen and examined on 04/22/2021       Brief hospital course: [name] is a 51 y.o. 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  In the ER, Cr 3.2, leukocytosis.    1/20 Admitted on antibiotics 1/22: Underwent trans-tarsal amputation, blood cx+ for MSSA 1/24: TEE       Assessment and Plan * Severe sepsis ([place])- (present on admission) Presented with tachycardia, leukocytosis, and renal failure creatinine >3.  Admitted on vancomycin, and found to have wound infection of the right forefoot, cellulitis, and MSSA bacteremia.  S/p metatarsal amputation by Dr. 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 March Rummage - Continue cefazolin - TEE tomorrow  MSSA bacteremia See above  Acute kidney injury superimposed on CKD ([place]) Baseline creatinine 2.4-2.6, and on presentation >3.  Improved back to 2.5-2.6 with fluids, antibiotics, amputation.  Creatinine stable today.  Mixed hyperlipidemia - Continue atorvastatin  Type 2 diabetes mellitus with hyperglycemia ([name]) Glucose controlled - Continue sliding scale corrections -Hold home glargine and Trulicity  - Given renal function and euglycemia here, probable discharge without glargine  Anemia in chronic kidney disease (CKD) Hemoglobin stable close to baseline  Obesity- (present on admission) BMI 33  Stage 3b chronic kidney disease (CKD) ([name])  Creatinine stable relative to baseline. - Outpatient follow-up with nephrology  Diabetic neuropathy ([place])- (present on admission) - Continue gabapentin  Essential hypertension Blood pressure normal - Continue amlodipine, carvedilol, clonidine     Subjective: Patient is feeling well has no complaints.  Pain is well  controlled, no fever, no confusion.  Objective Vital signs were reviewed and unremarkable. Adult male, lying in bed, no acute distress.  Appears weak and tired but is attentive and interactive and appropriate.  Oriented to person, place, and time.  Heart rate regular without murmurs.  No lower extremity edema.  Normal respiratory rate and rhythm, lungs clear without rales or wheezes.  No abdominal discomfort  Data Reviewed: The patient's labs and imaging are notable for creatinine stable relative to baseline a basic metabolic panel.  A complete blood count, white blood cell count 14, no change, hemoglobin 8.2, no change leukosis well controlled. Discussed with infectious disease and podiatry     Family Communication:   Disposition: Status is: Inpatient  Remains inpatient appropriate because: He requires ongoing IV antibiotics, TEE to evaluate for vegetation.  If the patient's debridement today looks good, if his TEE tomorrow is negative, likely home tomorrow afternoon after TEE with home health and oral antibiotics.  If the TEE shows a vegetation, will likely place PICC and discharge tomorrow after TEE with extended course antibiotics            Author: Edwin Dada, MD 04/22/2021 10:45 AM  For on call review [email or web address].

## 2021-04-22 NOTE — Assessment & Plan Note (Signed)
Blood pressure normal - Continue amlodipine, carvedilol, clonidine

## 2021-04-22 NOTE — Anesthesia Preprocedure Evaluation (Addendum)
Anesthesia Evaluation  Patient identified by MRN, date of birth, ID band Patient awake    Reviewed: Allergy & Precautions, NPO status , Patient's Chart, lab work & pertinent test results, reviewed documented beta blocker date and time   Airway Mallampati: III  TM Distance: >3 FB Neck ROM: Full    Dental  (+) Missing, Dental Advisory Given, Poor Dentition, Chipped,    Pulmonary neg pulmonary ROS, Current Smoker and Patient abstained from smoking.,    Pulmonary exam normal breath sounds clear to auscultation       Cardiovascular hypertension, Pt. on medications and Pt. on home beta blockers + CAD  Normal cardiovascular exam Rhythm:Regular Rate:Normal  TTE 2023 1. Left ventricular ejection fraction, by estimation, is 60 to 65%. The  left ventricle has normal function. The left ventricle has no regional  wall motion abnormalities. Left ventricular diastolic parameters were  normal.  2. Right ventricular systolic function is normal. The right ventricular  size is normal.  3. Trivial effuson persists since 03/31/21 not circumferential. The  pericardial effusion is posterior to the left ventricle.  4. The mitral valve is abnormal. Trivial mitral valve regurgitation. No  evidence of mitral stenosis.  5. The aortic valve was not well visualized. There is mild calcification  of the aortic valve. There is mild thickening of the aortic valve. Aortic  valve regurgitation is not visualized. Aortic valve sclerosis is present,  with no evidence of aortic valve  stenosis.  6. The inferior vena cava is normal in size with greater than 50%  respiratory variability, suggesting right atrial pressure of 3 mmHg.   Stress Test 2021 Nuclear stress EF: 55%. There was no ST segment deviation noted during stress. The study is normal. The left ventricular ejection fraction is normal (55-65%). This is a low risk study.    Neuro/Psych PSYCHIATRIC  DISORDERS Depression negative neurological ROS     GI/Hepatic negative GI ROS, Neg liver ROS,   Endo/Other  diabetes, Poorly Controlled, Type 2, Insulin Dependent  Renal/GU Renal InsufficiencyRenal diseaseLab Results      Component                Value               Date                      CREATININE               2.68 (H)            04/22/2021                BUN                      29 (H)              04/22/2021                NA                       135                 04/22/2021                K                        4.4  04/22/2021                CL                       107                 04/22/2021                CO2                      21 (L)              04/22/2021             negative genitourinary   Musculoskeletal negative musculoskeletal ROS (+)   Abdominal   Peds  Hematology  (+) Blood dyscrasia, anemia , Lab Results      Component                Value               Date                      WBC                      14.4 (H)            04/22/2021                HGB                      8.2 (L)             04/22/2021                HCT                      27.3 (L)            04/22/2021                MCV                      88.1                04/22/2021                PLT                      343                 04/22/2021              Anesthesia Other Findings 51 y.o. M with DM, HTN, CKD IIIb, prior DFU s/p wound vac, recent osteo/gas gangrene of the RIGHT foot  Reproductive/Obstetrics                           Anesthesia Physical Anesthesia Plan  ASA: 3  Anesthesia Plan: MAC   Post-op Pain Management:    Induction: Intravenous  PONV Risk Score and Plan: 0 and Propofol infusion, Treatment may vary due to age or medical condition and Midazolam  Airway Management Planned: Natural Airway  Additional Equipment:   Intra-op Plan:   Post-operative Plan:   Informed Consent: I have reviewed the patients  History and Physical, chart, labs and discussed the procedure including the risks, benefits and  alternatives for the proposed anesthesia with the patient or authorized representative who has indicated his/her understanding and acceptance.     Dental advisory given  Plan Discussed with: CRNA  Anesthesia Plan Comments:         Anesthesia Quick Evaluation

## 2021-04-22 NOTE — Assessment & Plan Note (Signed)
See above

## 2021-04-22 NOTE — Assessment & Plan Note (Signed)
Continue gabapentin.

## 2021-04-22 NOTE — Progress Notes (Signed)
Received Pt in bed, alert and oriented x 4, pain 6/10, RLE elevated. Dressing to RLE is clean/dry/intact but noted leakage error to Prevena. Switched to hospital wound vac per order. No error noted after switching, wound vac with continuous 198mmHg pressure.

## 2021-04-22 NOTE — Op Note (Signed)
°  Patient Name: [name] DOB: [birth year]  MRN: [id number]   Date of Surgery: 04/22/21  Surgeon: Dr. Hardie Pulley, DPM Assistants: none  Pre-operative Diagnosis:  Osteomyelitis right foot; encounter for planned wound closure, tendon contracture Post-operative Diagnosis:  Same Procedures:  1) Achilles tendon lengthening  2) Wound debridement and closure, complex. Pathology/Specimens: * No specimens in log * Anesthesia: MAC/local Hemostasis: anatomic Estimated Blood Loss: 10 mL Materials: Puraply MZ powder Medications: 10 ml 0.5% marcaine plain Complications: none  Indications for Procedure:  This is a 51 y.o. male with osteomyelitis of the foot. It was determined that amputation would be usual indication for this infection. Patient opted for a salvage approach. It was determined he would benefit from Chopart amputation with later wound closure and tendoachilles lengthening.   Procedure in Detail: Patient was identified in pre-operative holding area. Formal consent was signed and the right lower extremity was marked. Patient was brought back to the operating room. Anesthesia was induced. The extremity was prepped and draped in the usual sterile fashion. Timeout was taken to confirm patient name, laterality, and procedure prior to incision.   [name] was applied to the anterior foot wound and left intact during the tendon lengthening procedure to avoid cross contamination.  Attention was directed first to the posterior aspect of the right calf.  An incision was made at the central aspect of the Achilles tendon 3 cm proximal to the Achilles tendon insertion.  The blade was then rotated medially and the Achilles tendon was hemi-transected.  An additional incision was made 2 cm proximal to this an incision was again made and the blade was rotated laterally.  A final hemi-transection was performed an additional 2 cm proximal to the central incision with the blade being rotated  medially.   The ankle was then thoroughly manipulated to stretch the Achilles tendon and the resultant ankle was then was able to be brought to about 10 degrees of dorsiflexion.  The incisions were then copiously irrigated and closed with skin staples.  Attention was then directed to the Chopart amputation site. The wound was explored and there was retained surgicell and small amount of hematoma but no purulence or necrotic tissue. These were removed. It was noted that the surgicell was in contact with the calcaneus bone and appeared to lightly discolor or oxidize this area, but the bone appeared healthy and viable.   The wound was then thoroughly irrigated with 3L of normal saline as well as Irisept solution. The skin edges were then remodeled, and then the skin edges were undermined to allow for layered closure. Layered closure was performed with 2-0 vicryl, 2-0 nylon, and skin staples.   A Prevena dressing applied to the anterior foot wound. The achilles incisions were then dressed with xeroform, 4x4, kerlix, and ACE bandage to the Achilles incisions. Patient tolerated the procedure well.   Disposition: Following a period of post-operative monitoring, patient will be transferred home.

## 2021-04-22 NOTE — Transfer of Care (Signed)
Immediate Anesthesia Transfer of Care Note  Patient: [name]  Procedure(s) Performed: DEBRIDEMENT WOUND (Right) TENDON LENGTHENING (Right)  Patient Location: PACU  Anesthesia Type:MAC  Level of Consciousness: sedated  Airway & Oxygen Therapy: Patient Spontanous Breathing and Patient connected to face mask oxygen  Post-op Assessment: Report given to RN and Post -op Vital signs reviewed and stable  Post vital signs: Reviewed and stable  Last Vitals:  Vitals Value Taken Time  BP    Temp    Pulse 77 04/22/21 1433  Resp    SpO2 100 % 04/22/21 1433  Vitals shown include unvalidated device data.  Last Pain:  Vitals:   04/22/21 0938  TempSrc: Oral  PainSc: 0-No pain      Patients Stated Pain Goal: 4 (53/64/68 0321)  Complications: No notable events documented.

## 2021-04-22 NOTE — Assessment & Plan Note (Addendum)
Presented with tachycardia, leukocytosis, and renal failure creatinine >3.  Admitted on vancomycin, and found to have wound infection of the right forefoot, cellulitis, and MSSA bacteremia.  S/p metatarsal amputation by Dr. March Rummage 1/22 s/p debridement by Dr. March Rummage 1/25  ID consulted, narrowed to cefazolin  - Continue cefazolin - TEE Monday  After TEE: - if no vegetation, ID will discharge on oral antibiotics to complete short course - if vegetation noted, will place PICC and d/c with 6 weeks IV Abx

## 2021-04-23 ENCOUNTER — Encounter (HOSPITAL_COMMUNITY): Payer: Self-pay | Admitting: Podiatry

## 2021-04-23 DIAGNOSIS — E1142 Type 2 diabetes mellitus with diabetic polyneuropathy: Secondary | ICD-10-CM

## 2021-04-23 DIAGNOSIS — A419 Sepsis, unspecified organism: Secondary | ICD-10-CM | POA: Diagnosis not present

## 2021-04-23 DIAGNOSIS — N1832 Chronic kidney disease, stage 3b: Secondary | ICD-10-CM | POA: Diagnosis not present

## 2021-04-23 DIAGNOSIS — N179 Acute kidney failure, unspecified: Secondary | ICD-10-CM | POA: Diagnosis not present

## 2021-04-23 LAB — GLUCOSE, CAPILLARY
Glucose-Capillary: 112 mg/dL — ABNORMAL HIGH (ref 70–99)
Glucose-Capillary: 122 mg/dL — ABNORMAL HIGH (ref 70–99)
Glucose-Capillary: 124 mg/dL — ABNORMAL HIGH (ref 70–99)
Glucose-Capillary: 135 mg/dL — ABNORMAL HIGH (ref 70–99)
Glucose-Capillary: 145 mg/dL — ABNORMAL HIGH (ref 70–99)
Glucose-Capillary: 96 mg/dL (ref 70–99)

## 2021-04-23 MED ORDER — INSULIN ASPART 100 UNIT/ML IJ SOLN
0.0000 [IU] | Freq: Three times a day (TID) | INTRAMUSCULAR | Status: DC
  Administered 2021-04-23 – 2021-04-24 (×2): 1 [IU] via SUBCUTANEOUS
  Administered 2021-04-25: 2 [IU] via SUBCUTANEOUS
  Administered 2021-04-25: 1 [IU] via SUBCUTANEOUS

## 2021-04-23 MED ORDER — ACETAMINOPHEN 500 MG PO TABS
1000.0000 mg | ORAL_TABLET | Freq: Three times a day (TID) | ORAL | Status: DC
  Administered 2021-04-23 – 2021-04-28 (×15): 1000 mg via ORAL
  Filled 2021-04-23 (×16): qty 2

## 2021-04-23 MED ORDER — OXYCODONE HCL 5 MG PO TABS
5.0000 mg | ORAL_TABLET | ORAL | Status: DC | PRN
  Administered 2021-04-24 – 2021-04-26 (×4): 5 mg via ORAL
  Filled 2021-04-23 (×5): qty 1

## 2021-04-23 NOTE — Progress Notes (Signed)
°  Progress Note   Patient: [name] ZOX:[id number] DOB: Jul 03, 1970 DOA: 04/17/2021     6 DOS: the patient was seen and examined on 04/23/2021       Brief hospital course: [name] is a 51 y.o. 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  In the ER, Cr 3.2, leukocytosis.    1/20 Admitted on antibiotics 1/22: Underwent trans-tarsal amputation, blood cx+ for MSSA 1/25: Back to OR for final debridement   Now awaiting TEE       Assessment and Plan * Severe sepsis ([name])- (present on admission) Presented with tachycardia, leukocytosis, and renal failure creatinine >3.  Admitted on vancomycin, and found to have wound infection of the right forefoot, cellulitis, and MSSA bacteremia.  S/p metatarsal amputation by Dr. March Rummage 1/22 s/p debridement by Dr. March Rummage 1/25  ID consulted, narrowed to cefazolin  - Continue cefazolin - TEE Monday  After TEE: - if no vegetation, ID will discharge on oral antibiotics to complete short course - if vegetation noted, will place PICC and d/c with 6 weeks IV Abx  MSSA bacteremia See above  Acute kidney injury superimposed on CKD ([name]) Baseline creatinine 2.4-2.6, and on presentation >3.  Improved back to 2.5-2.6 with fluids, antibiotics, amputation.    Mixed hyperlipidemia - Continue atorvastatin  Type 2 diabetes mellitus with hyperglycemia ([name]) Glucose controlled - Continue sliding scale corrections -Hold home glargine and Trulicity  - Given renal function and euglycemia here, probable discharge without glargine  Anemia in chronic kidney disease (CKD) Hemoglobin stable close to baseline  Obesity- (present on admission) BMI 33  Stage 3b chronic kidney disease (CKD) ([place])   - Outpatient follow-up with nephrology  Diabetic neuropathy ([place])- (present on admission) - Continue gabapentin  Essential  hypertension Blood pressure normal - Continue amlodipine, carvedilol, clonidine         Subjective: Pain well controlled at rest, having shooting pains now working with PT.  No new fever or confusion.  Objective Vital signs were reviewed and unremarkable. Adult male, lying in bed, no acute distress. Appears weak and tired but has normal attention and affect. Heart rate regular, no murmurs, lower extremity edema, no rales, wheezes on lung exam, respiratory effort normal Moves all 4 extremities with generalized weakness but symmetric strength, judgment insight appear normal       Data Reviewed: Discussed with infectious disease There are no new laboratory studies to review     Family Communication:   Disposition: Status is: Inpatient  Remains inpatient appropriate because: He requires ongoing IV antibiotics, TEE to evaluate for vegetation.  If the patient's TEE shows vegetation, place PICC Monday afternoon, discharged with 6 weeks IV antibiotics to home with home health Monday afternoon  If TEE shows no vegetation, discharged with oral antibiotics to home with home health         Author: Edwin Dada, MD 04/23/2021 4:12 PM  For on call review [email or web address].

## 2021-04-23 NOTE — Evaluation (Addendum)
Physical Therapy Evaluation Patient Details Name: [name] MRN: [id number] DOB: Jun 24, 1970 Today's Date: 04/23/2021  History of Present Illness  51 y.o. male admitted 04/17/21 with R foot osteomyelitis, severe sepsis. s/p chopart amputation 04/19/21, and s/p achilles tendon lengthening and wound debridement 04/22/21 with wound vac placement. 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  Clinical Impression  Pt admitted with above diagnosis. Pt had difficulty tolerating sitting edge of bed initially 2* pain with RLE in dependent position as he's been in bed for 5 days. After several trials, he was able to tolerate sitting for several minutes, then could stand briefly with RW and quickly pivot to recliner. Standing tolerance also limited by pain in RLE when in dependent position. Pt received IV dilaudid during PT session. Instructed pt in RLE strengthening exercises. Will plan to attempt ambulation tomorrow. At present, he is unable to don the CAM boot due to bulky dressing on R foot, however he did well maintaining NWB status during transfer to recliner. He is not yet ready to DC home from a mobility standpoint.  He lives with his girlfriend who has CP and is in [facility], so has limited capacity to assist.  Pt currently with functional limitations due to the deficits listed below (see PT Problem List). Pt will benefit from skilled PT to increase their independence and safety with mobility to allow discharge to the venue listed below.          Recommendations for follow up therapy are one component of a multi-disciplinary discharge planning process, led by the attending physician.  Recommendations may be updated based on patient status, additional functional criteria and insurance authorization.  Follow Up Recommendations Home health PT    Assistance Recommended at Discharge Intermittent Supervision/Assistance  Patient can return home with the following  A  little help with bathing/dressing/bathroom;Assistance with cooking/housework;Assist for transportation;Help with stairs or ramp for entrance    Equipment Recommendations Wheelchair (measurements PT);Wheelchair cushion (measurements PT);Other (comment) (WC with elevating leg rests)  Recommendations for Other Services       Functional Status Assessment Patient has had a recent decline in their functional status and demonstrates the ability to make significant improvements in function in a reasonable and predictable amount of time.     Precautions / Restrictions Precautions Precautions: Fall Precaution Comments: denies falls in past 6 months Required Braces or Orthoses: Other Brace Other Brace: CAM boot Restrictions Weight Bearing Restrictions: Yes RLE Weight Bearing: Non weight bearing Other Position/Activity Restrictions: NWB RLE      Mobility  Bed Mobility Overal bed mobility: Modified Independent             General bed mobility comments: with bedrail, HOB up. Able to sit EOB for just ~30 seconds initially 2* pain with RLE in dependent position, pt then laid back down, after resting returned to sitting and was able to tolerate several minutes.    Transfers Overall transfer level: Needs assistance Equipment used: Rolling walker (2 wheels) Transfers: Sit to/from Stand, Bed to chair/wheelchair/BSC Sit to Stand: From elevated surface, Min guard   Step pivot transfers: Min guard       General transfer comment: VCs hand placement, min/guard for safety, standing tolerance limited by shooting pain in RLE when in dependent position (he has been in bed for 5 days, this was first attempt at sitting & standing) so was unable to attempt ambulation.    Ambulation/Gait  Stairs            Wheelchair Mobility    Modified Rankin (Stroke Patients Only)       Balance Overall balance assessment: Needs assistance Sitting-balance support: Feet  unsupported (LLE supported, RLE unsupported) Sitting balance-Leahy Scale: Good     Standing balance support: Bilateral upper extremity supported Standing balance-Leahy Scale: Poor Standing balance comment: relies on BUE support                             Pertinent Vitals/Pain Pain Assessment Pain Assessment: 0-10 Pain Score: 7  Pain Location: R foot in dependent position Pain Descriptors / Indicators: Sore Pain Intervention(s): Limited activity within patient's tolerance, Monitored during session, Patient requesting pain meds-RN notified, Repositioned, RN gave pain meds during session    Home Living Family/patient expects to be discharged to:: Private residence Living Arrangements: Spouse/significant other Available Help at Discharge: Family;Available 24 hours/day   Home Access: Ramped entrance       Home Layout: One level Home Equipment: Cane - single Barista (2 wheels);Wheelchair - manual Additional Comments: lives with girlfriend who has CP and is in manual WC    Prior Function Prior Level of Function : Independent/Modified Independent;Driving             Mobility Comments: walked with SPC, no falls in past 6 months       Hand Dominance        Extremity/Trunk Assessment   Upper Extremity Assessment Upper Extremity Assessment: Overall WFL for tasks assessed    Lower Extremity Assessment Lower Extremity Assessment: RLE deficits/detail RLE Deficits / Details: SLR 3/5, knee ext at least 3/5 RLE: Unable to fully assess due to pain    Cervical / Trunk Assessment Cervical / Trunk Assessment: Normal  Communication   Communication: No difficulties  Cognition Arousal/Alertness: Awake/alert Behavior During Therapy: WFL for tasks assessed/performed Overall Cognitive Status: Within Functional Limits for tasks assessed                                          General Comments      Exercises General Exercises -  Lower Extremity Quad Sets: AROM, Right, 5 reps, Supine Straight Leg Raises: AROM, Right, 5 reps, Supine   Assessment/Plan    PT Assessment Patient needs continued PT services  PT Problem List Decreased mobility;Decreased activity tolerance;Decreased balance;Pain       PT Treatment Interventions Gait training;Therapeutic exercise;Therapeutic activities;Functional mobility training;Patient/family education    PT Goals (Current goals can be found in the Care Plan section)  Acute Rehab PT Goals Patient Stated Goal: likes to gamble, go fishing, be able to drive PT Goal Formulation: With patient Time For Goal Achievement: 05/07/21 Potential to Achieve Goals: Good    Frequency Min 3X/week     Co-evaluation               AM-PAC PT "6 Clicks" Mobility  Outcome Measure Help needed turning from your back to your side while in a flat bed without using bedrails?: None Help needed moving from lying on your back to sitting on the side of a flat bed without using bedrails?: A Little Help needed moving to and from a bed to a chair (including a wheelchair)?: A Little Help needed standing up from a chair using your arms (e.g., wheelchair or bedside chair)?: A  Little Help needed to walk in hospital room?: A Lot Help needed climbing 3-5 steps with a railing? : Total 6 Click Score: 16    End of Session Equipment Utilized During Treatment: Gait belt Activity Tolerance: Patient limited by pain Patient left: in chair;with call bell/phone within reach;with chair alarm set;with nursing/sitter in room Nurse Communication: Mobility status;Patient requests pain meds PT Visit Diagnosis: Difficulty in walking, not elsewhere classified (R26.2);Pain;Unsteadiness on feet (R26.81) Pain - Right/Left: Right Pain - part of body: Ankle and joints of foot    Time: 4383-7793 PT Time Calculation (min) (ACUTE ONLY): 55 min   Charges:   PT Evaluation $PT Eval Moderate Complexity: 1 Mod PT  Treatments $Therapeutic Exercise: 8-22 mins $Therapeutic Activity: 23-37 mins       Blondell Reveal Kistler PT 04/23/2021  Acute Rehabilitation Services Pager [phone] Office [phone]

## 2021-04-23 NOTE — Care Management Important Message (Signed)
Important Message  Patient Details IM Letter given to the Patient. Name: [name] MRN: [id number] Date of Birth: May 31, 1970   Medicare Important Message Given:  Yes     Kerin Salen 04/23/2021, 1:51 PM

## 2021-04-23 NOTE — Plan of Care (Signed)
°  Problem: Clinical Measurements: °Goal: Ability to maintain clinical measurements within normal limits will improve °Outcome: Progressing °  °Problem: Clinical Measurements: °Goal: Will remain free from infection °Outcome: Progressing °  °Problem: Nutrition: °Goal: Adequate nutrition will be maintained °Outcome: Progressing °  °Problem: Activity: °Goal: Risk for activity intolerance will decrease °Outcome: Progressing °  °

## 2021-04-24 DIAGNOSIS — A419 Sepsis, unspecified organism: Secondary | ICD-10-CM | POA: Diagnosis not present

## 2021-04-24 DIAGNOSIS — R6520 Severe sepsis without septic shock: Secondary | ICD-10-CM | POA: Diagnosis not present

## 2021-04-24 LAB — GLUCOSE, CAPILLARY
Glucose-Capillary: 107 mg/dL — ABNORMAL HIGH (ref 70–99)
Glucose-Capillary: 148 mg/dL — ABNORMAL HIGH (ref 70–99)
Glucose-Capillary: 127 mg/dL — ABNORMAL HIGH (ref 70–99)
Glucose-Capillary: 135 mg/dL — ABNORMAL HIGH (ref 70–99)

## 2021-04-24 LAB — CBC
HCT: 25.7 % — ABNORMAL LOW (ref 39.0–52.0)
Hemoglobin: 7.9 g/dL — ABNORMAL LOW (ref 13.0–17.0)
MCH: 26.9 pg (ref 26.0–34.0)
MCHC: 30.7 g/dL (ref 30.0–36.0)
MCV: 87.4 fL (ref 80.0–100.0)
Platelets: 389 10*3/uL (ref 150–400)
RBC: 2.94 MIL/uL — ABNORMAL LOW (ref 4.22–5.81)
RDW: 14.3 % (ref 11.5–15.5)
WBC: 16.9 10*3/uL — ABNORMAL HIGH (ref 4.0–10.5)
nRBC: 0 % (ref 0.0–0.2)

## 2021-04-24 LAB — CBC WITH DIFFERENTIAL/PLATELET
Abs Immature Granulocytes: 0.1 10*3/uL — ABNORMAL HIGH (ref 0.00–0.07)
Basophils Absolute: 0.1 10*3/uL (ref 0.0–0.1)
Basophils Relative: 0 %
Eosinophils Absolute: 0.6 10*3/uL — ABNORMAL HIGH (ref 0.0–0.5)
Eosinophils Relative: 4 %
HCT: 25.5 % — ABNORMAL LOW (ref 39.0–52.0)
Hemoglobin: 8 g/dL — ABNORMAL LOW (ref 13.0–17.0)
Immature Granulocytes: 1 %
Lymphocytes Relative: 20 %
Lymphs Abs: 3.4 10*3/uL (ref 0.7–4.0)
MCH: 27.7 pg (ref 26.0–34.0)
MCHC: 31.4 g/dL (ref 30.0–36.0)
MCV: 88.2 fL (ref 80.0–100.0)
Monocytes Absolute: 1.3 10*3/uL — ABNORMAL HIGH (ref 0.1–1.0)
Monocytes Relative: 8 %
Neutro Abs: 11.4 10*3/uL — ABNORMAL HIGH (ref 1.7–7.7)
Neutrophils Relative %: 67 %
Platelets: 393 10*3/uL (ref 150–400)
RBC: 2.89 MIL/uL — ABNORMAL LOW (ref 4.22–5.81)
RDW: 14.5 % (ref 11.5–15.5)
WBC: 16.8 10*3/uL — ABNORMAL HIGH (ref 4.0–10.5)
nRBC: 0 % (ref 0.0–0.2)

## 2021-04-24 LAB — BASIC METABOLIC PANEL
Anion gap: 8 (ref 5–15)
BUN: 33 mg/dL — ABNORMAL HIGH (ref 6–20)
CO2: 22 mmol/L (ref 22–32)
Calcium: 8.1 mg/dL — ABNORMAL LOW (ref 8.9–10.3)
Chloride: 102 mmol/L (ref 98–111)
Creatinine, Ser: 3.43 mg/dL — ABNORMAL HIGH (ref 0.61–1.24)
GFR, Estimated: 21 mL/min — ABNORMAL LOW (ref >60)
Glucose, Bld: 118 mg/dL — ABNORMAL HIGH (ref 70–99)
Potassium: 4.5 mmol/L (ref 3.5–5.1)
Sodium: 132 mmol/L — ABNORMAL LOW (ref 135–145)

## 2021-04-24 MED ORDER — SENNOSIDES-DOCUSATE SODIUM 8.6-50 MG PO TABS
1.0000 | ORAL_TABLET | Freq: Two times a day (BID) | ORAL | Status: DC
  Administered 2021-04-24 – 2021-04-27 (×6): 1 via ORAL
  Filled 2021-04-24 (×7): qty 1

## 2021-04-24 MED ORDER — CEFAZOLIN SODIUM-DEXTROSE 2-4 GM/100ML-% IV SOLN
2.0000 g | Freq: Two times a day (BID) | INTRAVENOUS | Status: DC
  Administered 2021-04-24 – 2021-04-26 (×5): 2 g via INTRAVENOUS
  Filled 2021-04-24 (×5): qty 100

## 2021-04-24 MED ORDER — POLYETHYLENE GLYCOL 3350 17 G PO PACK
17.0000 g | PACK | Freq: Every day | ORAL | Status: DC
  Administered 2021-04-24 – 2021-04-26 (×3): 17 g via ORAL
  Filled 2021-04-24 (×3): qty 1

## 2021-04-24 MED ORDER — GABAPENTIN 300 MG PO CAPS
300.0000 mg | ORAL_CAPSULE | Freq: Two times a day (BID) | ORAL | Status: DC
  Administered 2021-04-24 – 2021-04-28 (×9): 300 mg via ORAL
  Filled 2021-04-24 (×9): qty 1

## 2021-04-24 MED ORDER — TAMSULOSIN HCL 0.4 MG PO CAPS
0.4000 mg | ORAL_CAPSULE | Freq: Every day | ORAL | Status: DC
  Administered 2021-04-24 – 2021-04-26 (×3): 0.4 mg via ORAL
  Filled 2021-04-24 (×5): qty 1

## 2021-04-24 NOTE — Progress Notes (Addendum)
Physical Therapy Treatment Patient Details Name: [name] MRN: [id number] DOB: [birth year] Today's Date: 04/24/2021   History of Present Illness 51 y.o. male admitted 04/17/21 with R foot osteomyelitis, severe sepsis. s/p chopart amputation 04/19/21, and s/p achilles tendon lengthening and wound debridement 04/22/21 with wound vac placement. 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    PT Comments    Pt tolerated increased activity level today, he ambulated 16' with RW with 2 standing rest breaks 2* fatigue and pain. He did well maintaining RLE in a non-weightbearing position. He will need a WC for longer distances.   Recommendations for follow up therapy are one component of a multi-disciplinary discharge planning process, led by the attending physician.  Recommendations may be updated based on patient status, additional functional criteria and insurance authorization.  Follow Up Recommendations  Home health PT     Assistance Recommended at Discharge Intermittent Supervision/Assistance  Patient can return home with the following A little help with bathing/dressing/bathroom;Assistance with cooking/housework;Assist for transportation;Help with stairs or ramp for entrance   Equipment Recommendations  Wheelchair (measurements PT);Wheelchair cushion (measurements PT);Other (comment) (WC with elevating leg rests)    Recommendations for Other Services       Precautions / Restrictions Precautions Precautions: Fall Precaution Comments: denies falls in past 6 months Required Braces or Orthoses: Other Brace Other Brace: CAM boot Restrictions RLE Weight Bearing: Non weight bearing Other Position/Activity Restrictions: NWB RLE     Mobility  Bed Mobility               General bed mobility comments: up in recliner    Transfers Overall transfer level: Needs assistance Equipment used: Rolling walker (2 wheels) Transfers: Sit to/from  Stand Sit to Stand: Min assist           General transfer comment: VCs hand placement, min A to steady & power up    Ambulation/Gait Ambulation/Gait assistance: Min assist Gait Distance (Feet): 16 Feet Assistive device: Rolling walker (2 wheels) Gait Pattern/deviations: Step-to pattern Gait velocity: decr     General Gait Details: pt hopped on LLE, did well maintaining NWB status on RLE, 2 standing rest breaks 2* fatigue   Stairs             Wheelchair Mobility    Modified Rankin (Stroke Patients Only)       Balance Overall balance assessment: Needs assistance Sitting-balance support: Feet unsupported (LLE supported, RLE unsupported) Sitting balance-Leahy Scale: Good     Standing balance support: Bilateral upper extremity supported Standing balance-Leahy Scale: Poor Standing balance comment: relies on BUE support                            Cognition Arousal/Alertness: Awake/alert Behavior During Therapy: WFL for tasks assessed/performed Overall Cognitive Status: Within Functional Limits for tasks assessed                                          Exercises      General Comments        Pertinent Vitals/Pain Pain Assessment Pain Score: 5  Pain Location: R foot Pain Descriptors / Indicators: Sore Pain Intervention(s): Limited activity within patient's tolerance, Monitored during session, Repositioned    Home Living  Prior Function            PT Goals (current goals can now be found in the care plan section) Acute Rehab PT Goals Patient Stated Goal: likes to gamble, go fishing, be able to drive PT Goal Formulation: With patient Time For Goal Achievement: 05/07/21 Potential to Achieve Goals: Good Progress towards PT goals: Progressing toward goals    Frequency    Min 3X/week      PT Plan Current plan remains appropriate    Co-evaluation              AM-PAC PT  "6 Clicks" Mobility   Outcome Measure  Help needed turning from your back to your side while in a flat bed without using bedrails?: None Help needed moving from lying on your back to sitting on the side of a flat bed without using bedrails?: A Little Help needed moving to and from a bed to a chair (including a wheelchair)?: A Little Help needed standing up from a chair using your arms (e.g., wheelchair or bedside chair)?: A Little Help needed to walk in hospital room?: A Little Help needed climbing 3-5 steps with a railing? : A Lot 6 Click Score: 18    End of Session Equipment Utilized During Treatment: Gait belt Activity Tolerance: Patient limited by pain;Patient limited by fatigue Patient left: in chair;with call bell/phone within reach;with chair alarm set Nurse Communication: Mobility status PT Visit Diagnosis: Difficulty in walking, not elsewhere classified (R26.2);Pain;Unsteadiness on feet (R26.81) Pain - Right/Left: Right Pain - part of body: Ankle and joints of foot     Time: 1203-1214 PT Time Calculation (min) (ACUTE ONLY): 11 min  Charges:  $Gait Training: 8-22 mins                    Blondell Reveal Kistler PT 04/24/2021  Acute Rehabilitation Services Pager [phone] Office [phone]

## 2021-04-24 NOTE — Progress Notes (Signed)
Attempted to call report for patient going to room 1312.  RN at lunch.  Message given to unit secretary.

## 2021-04-24 NOTE — Progress Notes (Signed)
Subjective:  Patient ID: [name], male    DOB: 30-Nov-1970,  MRN: [id number]  Chief Complaint  Patient presents with   Wound Check    Non healing wound right foot- pt reports he sees wound center almost everyday- MRI on right foot has been done 04/13/21    Callouses    Pt reports he is needing debridement on callus    DOS: 07/29/20 Procedures:             1) Debridement of wound down to and including bone.  51 y.o. male presents with the above complaint. History confirmed with patient.  States that the right foot has been ulcerated and he was referred back by wound care for further options.  Still having issues with hearing from the hyperbaric chamber.  States that Iodosorb is being applied by the wound care center.  Reports brown cloudy drainage with pain rated 8 out of 10 states that the wound care center change the dressing every day.  Well-timed 96.7.  Denies nausea vomiting fever chills Objective:  Physical Exam: tenderness at the surgical site, local edema noted and calf supple, nontender. Incision: Right foot wound with draining purulence, local warmth and erythema. Probe to bone.   Study Result  Narrative & Impression  CLINICAL DATA:  Foot wound, prior amputation, osteomyelitis suspected.   EXAM: MRI OF THE RIGHT FOREFOOT WITHOUT AND WITH CONTRAST   TECHNIQUE: Multiplanar, multisequence MR imaging of the right was performed before and after the administration of intravenous contrast.   CONTRAST:  73mL GADAVIST GADOBUTROL 1 MMOL/ML IV SOLN   COMPARISON:  None.   FINDINGS: Bones/Joint/Cartilage   There is a peripherally enhancing abscess within the cuboid measuring at least 0.7 x 3 point 4 x 1.5 cm. There is marked edema and enhancement of the navicular bone or the cuneiforms and proximal aspect of the residual metatarsals concerning for acute osteomyelitis.   Ligaments   Tendinopathy and edema of the flexor, peroneal and extensor compartment tendons.    Muscles and Tendons   Hyperintense signal of the plantar muscles consistent with diabetic myopathy/myositis.   Soft tissues   Deep skin wound about the lateral aspect of the cuboid with a peripherally enhancing collection measuring at least 1.9 x 1.3 by 2.9 cm with marked surrounding edema. Marked skin thickening and generalized subcutaneous soft tissue edema of the ankle/foot.   IMPRESSION: 1. Intraosseous abscess in the cuboid and edema of the navicular bone, all the cuneiforms and residual metatarsals consistent with ongoing osteomyelitis.   2. Deep skin wound about the lateral aspect of the cuboid with peripherally enhancing collection measuring at least 1.9 x 1.3 x 2.9 cm, consistent with abscess.   3. Marked skin thickening and generalized subcutaneous soft tissue edema consistent with cellulitis.     Assessment:   1. Abscess of right foot   2. Bone erosion   3. Other acute osteomyelitis of right foot ([place])     Plan:  Patient was evaluated and treated and all questions answered.  Right foot wound -Reulcerated. Reviewed MRI and patient's wound culture -We discussed options with patient.  He is at high risk of loss.  He would like to try limb salvage.  I discussed that if we infected bone he may have a chance of saving his foot however this is by no means a guarantee.  We have patient agrees with this plan.  We discussed proceeding with 2 emergency room and he will be admitted and plan for [place] amputation  of the right foot given osteomyelitis of the cuboid with draining sinus -Dressed with Betadine wet-to-dry today we will follow on admission.   No follow-ups on file.

## 2021-04-24 NOTE — Progress Notes (Signed)
°    [facility]   Reason for visit: Follow up on bacteremia  Interval History: TEE scheduled for Monday  Physical Exam: Constitutional:  Vitals:   04/24/21 0532 04/24/21 1342  BP: 132/73 106/66  Pulse: 82 85  Resp: 16 19  Temp: (!) 97.5 F (36.4 C) 98 F (36.7 C)  SpO2: 100% 100%   patient appears in NAD  Impression: bacteremia with 4/4 blood cultures positive for MSSA.   Osteomyelitis with surgical resection.    Plan: 1.  No changes, linezolid 10 days at discharge if TEE negative for vegetation.    Dr. Candiss Norse on for the weekend and Dr. [name] will follow up on Monday after the TEE

## 2021-04-24 NOTE — Progress Notes (Addendum)
PROGRESS NOTE    [name]  ZOX:[id number] DOB: [birth year] DOA: 04/17/2021 PCP: Camillia Herter, NP    Chief Complaint  Patient presents with   Recurrent Skin Infections    Brief Narrative:  [name] is a 51 y.o. 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   In the ER, Cr 3.2, leukocytosis.     1/20 Admitted on antibiotics 1/22: Underwent trans-tarsal amputation, blood cx+ for MSSA 1/25: Back to OR for debridement with would vac attached    Now awaiting TEE on monday  Subjective: He Is sitting up in chair Reports pain is controlled by prn meds Reports last bm was last Thursday, denies ab pain, appetite is poor but no n/v  Assessment & Plan:   Principal Problem:   Severe sepsis ([name]) Active Problems:   Essential hypertension   Diabetic neuropathy ([name])   Stage 3b chronic kidney disease (CKD) ([place])   Obesity   Anemia in chronic kidney disease (CKD)   Type 2 diabetes mellitus with hyperglycemia ([name])   Acute kidney injury superimposed on CKD ([name])   Mixed hyperlipidemia   MSSA bacteremia   Sepsis present on admission/MSSA bacteremia/right foot wound infection  -S/p metatarsal amputation by Dr. March Rummage 1/22 -s/p debridement with wound VAC by Dr. March Rummage 1/25, NWB RLE, wound care per podiatry -ID consulted, narrowed to cefazolin  - TEE Monday  After TEE: -  linezolid 10 days at discharge if TEE negative for vegetation pr ID recommendation - if vegetation noted, will place PICC and d/c with 6 weeks IV Abx  AKI on CKD IIIb Cr got worse today Will check post void residual, per chart review he has history of urinary retention requiring Foley in the past Will check UA Repeat BMP in the morning, renal dosing meds  Insulin-dependent type 2 diabetes/diabetic neuropathy Fairly controlled  continue statin, Neurontin dose reduced due to elevation of  creatinine Adjust insulin as needed Currently on SSI, has not needed any long-acting insulin Home medication Trulicity held currently  Hypertension Blood pressure stable on current regimen  Constipation Start stool softener  FTT: will need home health   Body mass index is 33.64 kg/m.Marland Kitchen Seen by dietician.  I agree with the assessment and plan as outlined below: Nutrition Status: Nutrition Problem: Increased nutrient needs Etiology: wound healing Signs/Symptoms: estimated needs Interventions: Premier Protein, MVI, Education  .    Unresulted Labs (From admission, onward)     Start     Ordered   04/25/21 8119  Basic metabolic panel  Daily,   R     Question:  Specimen collection method  Answer:  Lab=Lab collect   04/24/21 0913   04/24/21 0913  Urinalysis, Routine w reflex microscopic  Once,   R        04/24/21 0912              DVT prophylaxis: SCDs Start: 04/17/21 2124   Code Status: Full Family Communication: Patient Disposition:   Status is: Inpatient   Dispo: The patient is from: Home              Anticipated d/c is to: Home health              Anticipated d/c date is: Hopefully Monday if TEE negative                Consultants:  Podiatry ID Cardiology for TEE  Procedures:  As above  Antimicrobials:    Anti-infectives (From admission, onward)    Start     Dose/Rate Route Frequency Ordered Stop   04/24/21 1000  ceFAZolin (ANCEF) IVPB 2g/100 mL premix        2 g 200 mL/hr over 30 Minutes Intravenous Every 12 hours 04/24/21 0727     04/22/21 1350  vancomycin (VANCOCIN) powder  Status:  Discontinued          As needed 04/22/21 1350 04/22/21 1548   04/19/21 2200  vancomycin (VANCOREADY) IVPB 1500 mg/300 mL  Status:  Discontinued        1,500 mg 150 mL/hr over 120 Minutes Intravenous Every 48 hours 04/17/21 2301 04/18/21 1011   04/19/21 2200  vancomycin (VANCOCIN) IVPB 1000 mg/200 mL premix  Status:  Discontinued        1,000 mg 200 mL/hr over  60 Minutes Intravenous Every 48 hours 04/18/21 1011 04/18/21 1830   04/19/21 1615  levofloxacin (LEVAQUIN) tablet 750 mg  Status:  Discontinued        750 mg Oral Every 48 hours 04/19/21 1526 04/20/21 1031   04/19/21 1337  ceFAZolin (ANCEF) 2-4 GM/100ML-% IVPB       Note to Pharmacy: Dara Lords M: cabinet override      04/19/21 1337 04/20/21 0144   04/19/21 0800  ceFAZolin (ANCEF) IVPB 2g/100 mL premix  Status:  Discontinued        2 g 200 mL/hr over 30 Minutes Intravenous Every 8 hours 04/19/21 0719 04/24/21 0727   04/18/21 2000  ceFAZolin (ANCEF) IVPB 2g/100 mL premix  Status:  Discontinued        2 g 200 mL/hr over 30 Minutes Intravenous Every 12 hours 04/18/21 1830 04/19/21 0719   04/17/21 2345  vancomycin (VANCOREADY) IVPB 2000 mg/400 mL        2,000 mg 200 mL/hr over 120 Minutes Intravenous  Once 04/17/21 2245 04/18/21 0107          Objective: Vitals:   04/23/21 2115 04/24/21 0532 04/24/21 1342 04/24/21 1450  BP: 123/77 132/73 106/66 106/63  Pulse: 82 82 85 84  Resp: 16 16 19 15   Temp: 98.3 F (36.8 C) (!) 97.5 F (36.4 C) 98 F (36.7 C) 98.3 F (36.8 C)  TempSrc: Oral Oral Oral Oral  SpO2: 99% 100% 100% 100%  Weight:      Height:        Intake/Output Summary (Last 24 hours) at 04/24/2021 1727 Last data filed at 04/24/2021 1511 Gross per 24 hour  Intake 737.47 ml  Output 1650 ml  Net -912.53 ml   Filed Weights   04/17/21 1643  Weight: 88.9 kg    Examination:  General exam: alert, awake, communicative,calm, NAD Respiratory system: Clear to auscultation. Respiratory effort normal. Cardiovascular system:  RRR.  Gastrointestinal system: Abdomen is nondistended, soft and nontender.  Normal bowel sounds heard. Central nervous system: Alert and oriented. No focal neurological deficits. Extremities: Right foot postop changes, dressing intact, positive wound VAC Skin: No rashes, lesions or ulcers Psychiatry: Judgement and insight appear normal. Mood & affect  appropriate.     Data Reviewed: I have personally reviewed following labs and imaging studies  CBC: Recent Labs  Lab 04/18/21 0318 04/19/21 0310 04/20/21 0325 04/22/21 0324 04/24/21 0355  WBC 15.0* 12.5* 14.2* 14.4* 16.8*   16.9*  NEUTROABS  --   --  10.9*  --  11.4*  HGB 8.6* 8.1* 8.3* 8.2* 8.0*  7.9*  HCT 27.3* 25.8* 26.1* 27.3* 25.5*   25.7*  MCV 87.8 87.5 87.0 88.1 88.2   87.4  PLT 289 299 309 343 393   694    Basic Metabolic Panel: Recent Labs  Lab 04/18/21 0318 04/19/21 0310 04/20/21 0325 04/21/21 0332 04/22/21 0324 04/24/21 0321  NA 137 137 132* 134* 135 132*  K 3.8 3.8 4.1 4.0 4.4 4.5  CL 109 110 106 104 107 102  CO2 19* 20* 21* 22 21* 22  GLUCOSE 94 78 99 125* 135* 118*  BUN 30* 28* 26* 21* 29* 33*  CREATININE 3.06* 2.74* 2.50* 2.66* 2.68* 3.43*  CALCIUM 8.3* 8.2* 7.8* 8.1* 8.3* 8.1*  MG 2.0  --   --   --   --   --   PHOS 3.5  --   --  3.7  --   --     GFR: Estimated Creatinine Clearance: 25.6 mL/min (A) (by C-G formula based on SCr of 3.43 mg/dL (H)).  Liver Function Tests: Recent Labs  Lab 04/18/21 0318 04/19/21 0310 04/20/21 0325 04/21/21 0332  AST 11* 14* 11*  --   ALT 17 17 9   --   ALKPHOS 63 60 60  --   BILITOT 0.6 0.5 0.4  --   PROT 7.5 7.0 6.8  --   ALBUMIN 2.7* 2.3* 2.3* 2.4*    CBG: Recent Labs  Lab 04/23/21 1151 04/23/21 1525 04/23/21 2116 04/24/21 0751 04/24/21 1159  GLUCAP 124* 145* 135* 107* 148*     Recent Results (from the past 240 hour(s))  WOUND CULTURE     Status: Abnormal   Collection Time: 04/17/21  3:03 PM  Result Value Ref Range Status   MICRO NUMBER: [id number]  Final   SPECIMEN QUALITY: Adequate  Final   SOURCE: R FOOT  Final   STATUS: FINAL  Final   GRAM STAIN:   Final    No epithelial cells seen Few Polymorphonuclear leukocytes Few Gram positive cocci   ISOLATE 1: Staphylococcus aureus (A)  Final    Comment: Heavy growth of Staphylococcus aureus      Susceptibility   Staphylococcus aureus -  AEROBIC CULT, GRAM STAIN POSITIVE 1    VANCOMYCIN 1 Sensitive     CIPROFLOXACIN <=0.5 Sensitive     CLINDAMYCIN <=0.25 Sensitive     LEVOFLOXACIN 0.25 Sensitive     ERYTHROMYCIN <=0.25 Sensitive     GENTAMICIN <=0.5 Sensitive     OXACILLIN* <=0.25 Sensitive      * Oxacillin susceptible staphylococci are susceptible to other penicillinase-stable penicillins (e.g., methicillin, nafcillin), beta- lactam/beta-lactamase inhibitor combinations, and cephems with staphylococcal indications, including cefazolin.     TETRACYCLINE <=1 Sensitive     TRIMETH/SULFA* <=10 Sensitive      * Oxacillin susceptible staphylococci are susceptible to other penicillinase-stable penicillins (e.g., methicillin, nafcillin), beta- lactam/beta-lactamase inhibitor combinations, and cephems with staphylococcal indications, including cefazolin. Legend: S = Susceptible  I = Intermediate R = Resistant  NS = Not susceptible * = Not tested  NR = Not reported **NN = See antimicrobic comments   Resp Panel by RT-PCR (Flu A&B, Covid) Nasopharyngeal Swab     Status: None   Collection Time: 04/17/21  5:17 PM   Specimen: Nasopharyngeal Swab; Nasopharyngeal(NP) swabs in vial transport medium  Result Value Ref Range Status   SARS Coronavirus 2 by RT PCR NEGATIVE NEGATIVE Final    Comment: (NOTE) SARS-CoV-2 target nucleic acids are NOT DETECTED.  The SARS-CoV-2 RNA is generally detectable in upper  respiratory specimens during the acute phase of infection. The lowest concentration of SARS-CoV-2 viral copies this assay can detect is 138 copies/mL. A negative result does not preclude SARS-Cov-2 infection and should not be used as the sole basis for treatment or other patient management decisions. A negative result may occur with  improper specimen collection/handling, submission of specimen other than nasopharyngeal swab, presence of viral mutation(s) within the areas targeted by this assay, and inadequate number of  viral copies(<138 copies/mL). A negative result must be combined with clinical observations, patient history, and epidemiological information. The expected result is Negative.  Fact Sheet for Patients:  EntrepreneurPulse.com.au  Fact Sheet for Healthcare Providers:  IncredibleEmployment.be  This test is no t yet approved or cleared by the [place] FDA and  has been authorized for detection and/or diagnosis of SARS-CoV-2 by FDA under an Emergency Use Authorization (EUA). This EUA will remain  in effect (meaning this test can be used) for the duration of the COVID-19 declaration under Section 564(b)(1) of the Act, 21 U.S.C.section 360bbb-3(b)(1), unless the authorization is terminated  or revoked sooner.       Influenza A by PCR NEGATIVE NEGATIVE Final   Influenza B by PCR NEGATIVE NEGATIVE Final    Comment: (NOTE) The Xpert Xpress SARS-CoV-2/FLU/RSV plus assay is intended as an aid in the diagnosis of influenza from Nasopharyngeal swab specimens and should not be used as a sole basis for treatment. Nasal washings and aspirates are unacceptable for Xpert Xpress SARS-CoV-2/FLU/RSV testing.  Fact Sheet for Patients: EntrepreneurPulse.com.au  Fact Sheet for Healthcare Providers: IncredibleEmployment.be  This test is not yet approved or cleared by the [place] FDA and has been authorized for detection and/or diagnosis of SARS-CoV-2 by FDA under an Emergency Use Authorization (EUA). This EUA will remain in effect (meaning this test can be used) for the duration of the COVID-19 declaration under Section 564(b)(1) of the Act, 21 U.S.C. section 360bbb-3(b)(1), unless the authorization is terminated or revoked.  Performed at [facility], [place] [address] [place], [place] [address]   Culture, blood (routine x 2)     Status: Abnormal   Collection Time: 04/17/21  5:17 PM   Specimen:  Right Antecubital; Blood  Result Value Ref Range Status   Specimen Description   Final    RIGHT ANTECUBITAL Performed at [facility] [address], [place], [place] [address]    Special Requests   Final    BOTTLES DRAWN AEROBIC AND ANAEROBIC Blood Culture results may not be optimal due to an inadequate volume of blood received in culture bottles Performed at [facility] [address], [place], [place] [address]    Culture  Setup Time   Final    GRAM POSITIVE COCCI IN CLUSTERS IN BOTH AEROBIC AND ANAEROBIC BOTTLES CRITICAL RESULT CALLED TO, READ BACK BY AND VERIFIED WITH: E,WILLIAMSON PHARMD@[email or web address]  04/18/21 EB Performed at [facility], [place] [address], [place], [place] [address]    Culture STAPHYLOCOCCUS AUREUS (A)  Final   Report Status 04/20/2021 FINAL  Final   Organism ID, Bacteria STAPHYLOCOCCUS AUREUS  Final      Susceptibility   Staphylococcus aureus - MIC*    CIPROFLOXACIN >=8 RESISTANT Resistant     ERYTHROMYCIN >=8 RESISTANT Resistant     GENTAMICIN <=0.5 SENSITIVE Sensitive     OXACILLIN 1 SENSITIVE Sensitive     TETRACYCLINE >=16 RESISTANT Resistant     VANCOMYCIN 1 SENSITIVE Sensitive     TRIMETH/SULFA <=10  SENSITIVE Sensitive     CLINDAMYCIN RESISTANT Resistant     RIFAMPIN <=0.5 SENSITIVE Sensitive     Inducible Clindamycin POSITIVE Resistant     * STAPHYLOCOCCUS AUREUS  Blood Culture ID Panel (Reflexed)     Status: Abnormal   Collection Time: 04/17/21  5:17 PM  Result Value Ref Range Status   Enterococcus faecalis NOT DETECTED NOT DETECTED Final   Enterococcus Faecium NOT DETECTED NOT DETECTED Final   Listeria monocytogenes NOT DETECTED NOT DETECTED Final   Staphylococcus species DETECTED (A) NOT DETECTED Final    Comment: CRITICAL RESULT CALLED TO, READ BACK BY AND VERIFIED WITH: E,WILLIAMSON PHARMD@1822  04/18/21 EB    Staphylococcus aureus (BCID) DETECTED (A) NOT DETECTED Final    Comment: CRITICAL RESULT CALLED  TO, READ BACK BY AND VERIFIED WITH: E,WILLIAMSON PHARMD@1822  04/18/21 EB    Staphylococcus epidermidis NOT DETECTED NOT DETECTED Final   Staphylococcus lugdunensis NOT DETECTED NOT DETECTED Final   Streptococcus species NOT DETECTED NOT DETECTED Final   Streptococcus agalactiae NOT DETECTED NOT DETECTED Final   Streptococcus pneumoniae NOT DETECTED NOT DETECTED Final   Streptococcus pyogenes NOT DETECTED NOT DETECTED Final   A.calcoaceticus-baumannii NOT DETECTED NOT DETECTED Final   Bacteroides fragilis NOT DETECTED NOT DETECTED Final   Enterobacterales NOT DETECTED NOT DETECTED Final   Enterobacter cloacae complex NOT DETECTED NOT DETECTED Final   Escherichia coli NOT DETECTED NOT DETECTED Final   Klebsiella aerogenes NOT DETECTED NOT DETECTED Final   Klebsiella oxytoca NOT DETECTED NOT DETECTED Final   Klebsiella pneumoniae NOT DETECTED NOT DETECTED Final   Proteus species NOT DETECTED NOT DETECTED Final   Salmonella species NOT DETECTED NOT DETECTED Final   Serratia marcescens NOT DETECTED NOT DETECTED Final   Haemophilus influenzae NOT DETECTED NOT DETECTED Final   Neisseria meningitidis NOT DETECTED NOT DETECTED Final   Pseudomonas aeruginosa NOT DETECTED NOT DETECTED Final   Stenotrophomonas maltophilia NOT DETECTED NOT DETECTED Final   Candida albicans NOT DETECTED NOT DETECTED Final   Candida auris NOT DETECTED NOT DETECTED Final   Candida glabrata NOT DETECTED NOT DETECTED Final   Candida krusei NOT DETECTED NOT DETECTED Final   Candida parapsilosis NOT DETECTED NOT DETECTED Final   Candida tropicalis NOT DETECTED NOT DETECTED Final   Cryptococcus neoformans/gattii NOT DETECTED NOT DETECTED Final   Meth resistant mecA/C and MREJ NOT DETECTED NOT DETECTED Final    Comment: Performed at [facility], [address], [place], [place] [address]  Surgical PCR screen     Status: None   Collection Time: 04/18/21  5:49 AM   Specimen: Nasal Mucosa; Nasal Swab  Result Value  Ref Range Status   MRSA, PCR NEGATIVE NEGATIVE Final   Staphylococcus aureus NEGATIVE NEGATIVE Final    Comment: (NOTE) The Xpert SA Assay (FDA approved for NASAL specimens in patients 36 years of age and older), is one component of a comprehensive surveillance program. It is not intended to diagnose infection nor to guide or monitor treatment. Performed at [facility], [place] [address], [place], [place] [address]   Culture, blood (routine x 2)     Status: None (Preliminary result)   Collection Time: 04/21/21  3:32 AM   Specimen: BLOOD  Result Value Ref Range Status   Specimen Description   Final    BLOOD BLOOD LEFT ARM Performed at [facility] [address] [place],  [address]    Special Requests   Final    BOTTLES DRAWN AEROBIC ONLY  Blood Culture adequate volume Performed at [facility] [address] [place], [place] [address]    Culture   Final    NO GROWTH 3 DAYS Performed at [facility], [place] [address] [place], [place] [address]    Report Status PENDING  Incomplete  Culture, blood (routine x 2)     Status: None (Preliminary result)   Collection Time: 04/21/21  3:32 AM   Specimen: BLOOD LEFT HAND  Result Value Ref Range Status   Specimen Description   Final    BLOOD LEFT HAND Performed at [facility], [place] [address], [place], [place] [address]    Special Requests   Final    BOTTLES DRAWN AEROBIC ONLY Blood Culture adequate volume Performed at [facility] [address] [place], [place] [address]    Culture   Final    NO GROWTH 3 DAYS Performed at [facility], [place] [address], [place], [place] [address]    Report Status PENDING  Incomplete         Radiology Studies: No results found.      Scheduled Meds:  acetaminophen  1,000 mg Oral TID   amLODipine  10 mg Oral Daily   vitamin C  500 mg Oral Daily   aspirin EC  81 mg Oral q morning    atorvastatin  20 mg Oral Daily   carvedilol  25 mg Oral BID   cloNIDine  0.1 mg Oral BID   ferrous sulfate  325 mg Oral Q breakfast   gabapentin  300 mg Oral BID   insulin aspart  0-9 Units Subcutaneous TID WC   multivitamin with minerals  1 tablet Oral Daily   ondansetron (ZOFRAN) IV  4 mg Intravenous Q6H   Ensure Max Protein  11 oz Oral BID   Continuous Infusions:   ceFAZolin (ANCEF) IV 2 g (04/24/21 0849)     LOS: 7 days    Greater than 50% of this time was spent in counseling, explanation of diagnosis, planning of further management, and coordination of care.   Voice Recognition Viviann Spare dictation system was used to create this note, attempts have been made to correct errors. Please contact the author with questions and/or clarifications.   Florencia Reasons, MD PhD FACP [facility]  Available via Epic secure chat 7am-7pm for nonurgent issues Please page for urgent issues To page the attending provider between 7A-7P or the covering provider during after hours 7P-7A, please log into the web site [email or web address] and access using universal [place] password for that web site. If you do not have the password, please call the hospital operator.    04/24/2021, 5:27 PM

## 2021-04-24 NOTE — Progress Notes (Signed)
Patient wheeled over to room 1312 with all belongings.  Report given to Rita,rn and care relinquished.

## 2021-04-24 NOTE — Progress Notes (Signed)
° ° °  [facility] has been requested to perform a transesophageal echocardiogram on this patient for bacteremia.  After careful review of history and examination, the risks and benefits of transesophageal echocardiogram have been explained including risks of esophageal damage, perforation (1:10,000 risk), bleeding, pharyngeal hematoma as well as other potential complications associated with anesthesia including aspiration, arrhythmia, respiratory failure and death. Alternatives to treatment were discussed, questions were answered. Patient is willing to proceed. Orders written. Scheduled 9am on Monday @ [facility] with Dr. Stanford Breed.  [name], PA-C 04/24/2021 12:26 PM

## 2021-04-24 NOTE — Progress Notes (Signed)
PHARMACY NOTE:  ANTIMICROBIAL RENAL DOSAGE ADJUSTMENT  Current antimicrobial regimen includes a mismatch between antimicrobial dosage and estimated renal function.  As per policy approved by the Pharmacy & Therapeutics and Medical Executive Committees, the antimicrobial dosage will be adjusted accordingly.  Current antimicrobial dosage:  Cefazolin 2 g IV q8h  Indication: MSSA Bacteremia  Renal Function: Increase in SCr  Estimated Creatinine Clearance: 25.6 mL/min (A) (by C-G formula based on SCr of 3.43 mg/dL (H)). []      On intermittent HD, scheduled: []      On CRRT    Antimicrobial dosage has been changed to:  Cefazolin 2 g IV q12h  Lenis Noon, [facility] 04/24/2021 7:28 AM

## 2021-04-24 NOTE — Progress Notes (Signed)
Report given to Rita,rn. Patient to be transported via wheelchair with belongings.

## 2021-04-25 DIAGNOSIS — R6520 Severe sepsis without septic shock: Secondary | ICD-10-CM | POA: Diagnosis not present

## 2021-04-25 DIAGNOSIS — A419 Sepsis, unspecified organism: Secondary | ICD-10-CM | POA: Diagnosis not present

## 2021-04-25 DIAGNOSIS — N179 Acute kidney failure, unspecified: Secondary | ICD-10-CM | POA: Diagnosis not present

## 2021-04-25 DIAGNOSIS — N189 Chronic kidney disease, unspecified: Secondary | ICD-10-CM | POA: Diagnosis not present

## 2021-04-25 LAB — GLUCOSE, CAPILLARY
Glucose-Capillary: 139 mg/dL — ABNORMAL HIGH (ref 70–99)
Glucose-Capillary: 185 mg/dL — ABNORMAL HIGH (ref 70–99)
Glucose-Capillary: 116 mg/dL — ABNORMAL HIGH (ref 70–99)
Glucose-Capillary: 133 mg/dL — ABNORMAL HIGH (ref 70–99)

## 2021-04-25 LAB — URINALYSIS, ROUTINE W REFLEX MICROSCOPIC
Bilirubin Urine: NEGATIVE
Glucose, UA: NEGATIVE mg/dL
Ketones, ur: NEGATIVE mg/dL
Leukocytes,Ua: NEGATIVE
Nitrite: NEGATIVE
Protein, ur: 100 mg/dL — AB
Specific Gravity, Urine: 1.013 (ref 1.005–1.030)
pH: 5 (ref 5.0–8.0)

## 2021-04-25 LAB — BASIC METABOLIC PANEL
Anion gap: 8 (ref 5–15)
BUN: 36 mg/dL — ABNORMAL HIGH (ref 6–20)
CO2: 22 mmol/L (ref 22–32)
Calcium: 8.5 mg/dL — ABNORMAL LOW (ref 8.9–10.3)
Chloride: 105 mmol/L (ref 98–111)
Creatinine, Ser: 3.37 mg/dL — ABNORMAL HIGH (ref 0.61–1.24)
GFR, Estimated: 21 mL/min — ABNORMAL LOW (ref >60)
Glucose, Bld: 111 mg/dL — ABNORMAL HIGH (ref 70–99)
Potassium: 4.3 mmol/L (ref 3.5–5.1)
Sodium: 135 mmol/L (ref 135–145)

## 2021-04-25 MED ORDER — SODIUM CHLORIDE 0.9 % IV SOLN: INTRAVENOUS | Status: DC

## 2021-04-25 NOTE — Progress Notes (Signed)
PROGRESS NOTE  [name] SFK:[id number] DOB: March 10, 1971 DOA: 04/17/2021 PCP: Camillia Herter, NP   LOS: 8 days   Brief Narrative / Interim history: 51 year old male with history of DM2, HTN, CKD 3B, recurrent right foot wound seen by podiatry as an outpatient comes into the hospital due to worsening of his wound.  He was sent here from his podiatrist office.  Blood cultures have returned positive for MSSA and ID was consulted.  He was taken to the OR on 1/25 and underwent final debridement.  Given MSSA bacteremia a TEE is pending for Monday.  Subjective / 24h Interval events: -Doing well this morning, denies any chest pain, no abdominal pain.  Denies any shortness of breath.  Assessment & Plan: Principal problem Severe sepsis due to MSSA bacteremia -source from his right foot.  Podiatry and ID following.  He met sepsis criteria on admission with tachycardia, increased WBC as well as acute kidney injury.  He was initially placed on vancomycin, now transition to cefazolin.  Sepsis physiology resolved.  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  He is awaiting a TEE on Monday.  If no vegetation, he will be placed on oral antibiotics to complete a short course, if has evidence of endocarditis will need PICC line and 6 weeks of IV antibiotics.  Currently has a wound VAC in place  Active problems Acute kidney injury, chronic kidney disease 3B-Baseline creatinine 2.4-2.6, initially improved but increased again in the 3 range since yesterday.  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  BPH-continue Flomax  DM2, diabetic neuropathy-continue sliding scale, gabapentin  CBG (last 3)  Recent Labs     04/24/21 1746 04/24/21 2147 04/25/21 0744  GLUCAP 127* 135* 139*   Scheduled Meds:  acetaminophen  1,000 mg Oral TID   amLODipine  10 mg Oral Daily   vitamin C  500 mg Oral Daily   aspirin EC  81 mg Oral q morning   atorvastatin  20 mg Oral Daily   carvedilol  25 mg Oral BID   cloNIDine  0.1 mg Oral BID   ferrous sulfate  325 mg Oral Q breakfast   gabapentin  300 mg Oral BID   insulin aspart  0-9 Units Subcutaneous TID WC   multivitamin with minerals  1 tablet Oral Daily   ondansetron (ZOFRAN) IV  4 mg Intravenous Q6H   polyethylene glycol  17 g Oral Daily   Ensure Max Protein  11 oz Oral BID   senna-docusate  1 tablet Oral BID   tamsulosin  0.4 mg Oral QPC supper   Continuous Infusions:   ceFAZolin (ANCEF) IV 2 g (04/24/21 2232)   PRN Meds:.oxyCODONE  Diet Orders (From admission, onward)     Start     Ordered   04/27/21 0001  Diet NPO time specified Except for: Sips with Meds  Diet effective ____       Comments: Patient to remain NPO until fully awake following the TEE.  Question:  Except for  Answer:  Sips with Meds   04/24/21 1225   04/22/21 1554  Diet Carb Modified Fluid consistency: Thin; Room service appropriate? Yes  Diet effective now       Question Answer Comment  Diet-HS Snack? Nothing   Calorie Level Medium 1600-2000  Fluid consistency: Thin   Room service appropriate? Yes      04/22/21 1553            DVT prophylaxis: SCDs Start: 04/17/21 2124   Lab Results  Component Value Date   PLT 389 04/24/2021   PLT 393 04/24/2021      Code Status: Full Code  Family Communication: No family at bedside  Status is: Inpatient  Remains inpatient appropriate because: Awaiting TEE  Level of care: Med-Surg  Consultants:  Podiatry ID  Microbiology  MSSA bacteremia on cultures 1/20 Surveillance culture 1/24-no growth at 4 days  Antimicrobials: Cefazolin   Objective: Vitals:   04/24/21 1739 04/24/21 2022 04/25/21 0008 04/25/21 0538  BP:  135/89 126/74 (!) 141/90 124/79  Pulse: 84 80 85 80  Resp: $Remo'16 16 18 18  'wyXBB$ Temp: (!) 97.4 F (36.3 C) (!) 97.5 F (36.4 C) 97.8 F (36.6 C) (!) 97.5 F (36.4 C)  TempSrc: Oral Oral Oral Oral  SpO2: 100% 100% 100% 100%  Weight:      Height:        Intake/Output Summary (Last 24 hours) at 04/25/2021 0945 Last data filed at 04/25/2021 9417 Gross per 24 hour  Intake 700 ml  Output 1654 ml  Net -954 ml   Wt Readings from Last 3 Encounters:  04/17/21 88.9 kg  04/01/21 86.4 kg  03/03/21 89.4 kg    Examination:  Constitutional: NAD Eyes: no scleral icterus ENMT: Mucous membranes are moist.  Neck: normal, supple Respiratory: clear to auscultation bilaterally, no wheezing, no crackles. Normal respiratory effort.  Cardiovascular: Regular rate and rhythm, no murmurs / rubs / gallops. No LE edema. Good peripheral pulses Abdomen: non distended, no tenderness. Bowel sounds positive.  Musculoskeletal: no clubbing / cyanosis.  Skin: no rashes Neurologic: Nonfocal   Data Reviewed: I have independently reviewed following labs and imaging studies  CBC Recent Labs  Lab 04/19/21 0310 04/20/21 0325 04/22/21 0324 04/24/21 0355  WBC 12.5* 14.2* 14.4* 16.8*   16.9*  HGB 8.1* 8.3* 8.2* 8.0*   7.9*  HCT 25.8* 26.1* 27.3* 25.5*   25.7*  PLT 299 309 343 393   389  MCV 87.5 87.0 88.1 88.2   87.4  MCH 27.5 27.7 26.5 27.7   26.9  MCHC 31.4 31.8 30.0 31.4   30.7  RDW 14.1 14.1 14.1 14.5   14.3  LYMPHSABS  --  2.0  --  3.4  MONOABS  --  0.8  --  1.3*  EOSABS  --  0.4  --  0.6*  BASOSABS  --  0.0  --  0.1    Recent Labs  Lab 04/19/21 0310 04/20/21 0325 04/21/21 0332 04/22/21 0324 04/24/21 0321 04/25/21 0450  NA 137 132* 134* 135 132* 135  K 3.8 4.1 4.0 4.4 4.5 4.3  CL 110 106 104 107 102 105  CO2 20* 21* 22 21* 22 22  GLUCOSE 78 99 125* 135* 118* 111*  BUN 28* 26* 21* 29* 33* 36*  CREATININE 2.74* 2.50* 2.66* 2.68* 3.43* 3.37*  CALCIUM 8.2* 7.8* 8.1* 8.3* 8.1* 8.5*  AST 14* 11*   --   --   --   --   ALT 17 9  --   --   --   --   ALKPHOS 60 60  --   --   --   --   BILITOT 0.5 0.4  --   --   --   --   ALBUMIN 2.3* 2.3* 2.4*  --   --   --     ------------------------------------------------------------------------------------------------------------------  No results for input(s): CHOL, HDL, LDLCALC, TRIG, CHOLHDL, LDLDIRECT in the last 72 hours.  Lab Results  Component Value Date   HGBA1C 6.3 (H) 04/01/2021   ------------------------------------------------------------------------------------------------------------------ No results for input(s): TSH, T4TOTAL, T3FREE, THYROIDAB in the last 72 hours.  Invalid input(s): FREET3  Cardiac Enzymes No results for input(s): CKMB, TROPONINI, MYOGLOBIN in the last 168 hours.  Invalid input(s): CK ------------------------------------------------------------------------------------------------------------------ No results found for: BNP  CBG: Recent Labs  Lab 04/24/21 0751 04/24/21 1159 04/24/21 1746 04/24/21 2147 04/25/21 0744  GLUCAP 107* 148* 127* 135* 139*    Recent Results (from the past 240 hour(s))  WOUND CULTURE     Status: Abnormal   Collection Time: 04/17/21  3:03 PM  Result Value Ref Range Status   MICRO NUMBER: [id number]  Final   SPECIMEN QUALITY: Adequate  Final   SOURCE: R FOOT  Final   STATUS: FINAL  Final   GRAM STAIN:   Final    No epithelial cells seen Few Polymorphonuclear leukocytes Few Gram positive cocci   ISOLATE 1: Staphylococcus aureus (A)  Final    Comment: Heavy growth of Staphylococcus aureus      Susceptibility   Staphylococcus aureus - AEROBIC CULT, GRAM STAIN POSITIVE 1    VANCOMYCIN 1 Sensitive     CIPROFLOXACIN <=0.5 Sensitive     CLINDAMYCIN <=0.25 Sensitive     LEVOFLOXACIN 0.25 Sensitive     ERYTHROMYCIN <=0.25 Sensitive     GENTAMICIN <=0.5 Sensitive     OXACILLIN* <=0.25 Sensitive      * Oxacillin susceptible staphylococci are susceptible to other  penicillinase-stable penicillins (e.g., methicillin, nafcillin), beta- lactam/beta-lactamase inhibitor combinations, and cephems with staphylococcal indications, including cefazolin.     TETRACYCLINE <=1 Sensitive     TRIMETH/SULFA* <=10 Sensitive      * Oxacillin susceptible staphylococci are susceptible to other penicillinase-stable penicillins (e.g., methicillin, nafcillin), beta- lactam/beta-lactamase inhibitor combinations, and cephems with staphylococcal indications, including cefazolin. Legend: S = Susceptible  I = Intermediate R = Resistant  NS = Not susceptible * = Not tested  NR = Not reported **NN = See antimicrobic comments   Resp Panel by RT-PCR (Flu A&B, Covid) Nasopharyngeal Swab     Status: None   Collection Time: 04/17/21  5:17 PM   Specimen: Nasopharyngeal Swab; Nasopharyngeal(NP) swabs in vial transport medium  Result Value Ref Range Status   SARS Coronavirus 2 by RT PCR NEGATIVE NEGATIVE Final    Comment: (NOTE) SARS-CoV-2 target nucleic acids are NOT DETECTED.  The SARS-CoV-2 RNA is generally detectable in upper respiratory specimens during the acute phase of infection. The lowest concentration of SARS-CoV-2 viral copies this assay can detect is 138 copies/mL. A negative result does not preclude SARS-Cov-2 infection and should not be used as the sole basis for treatment or other patient management decisions. A negative result may occur with  improper specimen collection/handling, submission of specimen other than nasopharyngeal swab, presence of viral mutation(s) within the areas targeted by this assay, and inadequate number of viral copies(<138 copies/mL). A negative result must be combined with clinical observations, patient history, and epidemiological information. The expected result is Negative.  Fact Sheet for Patients:  EntrepreneurPulse.com.au  Fact Sheet for Healthcare Providers:   IncredibleEmployment.be  This test is no t yet approved or cleared by the [place] FDA and  has been authorized for detection and/or diagnosis of SARS-CoV-2 by FDA under an Emergency Use Authorization (EUA). This EUA will remain  in effect (meaning this test can be used)  for the duration of the COVID-19 declaration under Section 564(b)(1) of the Act, 21 U.S.C.section 360bbb-3(b)(1), unless the authorization is terminated  or revoked sooner.       Influenza A by PCR NEGATIVE NEGATIVE Final   Influenza B by PCR NEGATIVE NEGATIVE Final    Comment: (NOTE) The Xpert Xpress SARS-CoV-2/FLU/RSV plus assay is intended as an aid in the diagnosis of influenza from Nasopharyngeal swab specimens and should not be used as a sole basis for treatment. Nasal washings and aspirates are unacceptable for Xpert Xpress SARS-CoV-2/FLU/RSV testing.  Fact Sheet for Patients: EntrepreneurPulse.com.au  Fact Sheet for Healthcare Providers: IncredibleEmployment.be  This test is not yet approved or cleared by the [place] FDA and has been authorized for detection and/or diagnosis of SARS-CoV-2 by FDA under an Emergency Use Authorization (EUA). This EUA will remain in effect (meaning this test can be used) for the duration of the COVID-19 declaration under Section 564(b)(1) of the Act, 21 U.S.C. section 360bbb-3(b)(1), unless the authorization is terminated or revoked.  Performed at [facility], [place] [address] [place], [place] [address]   Culture, blood (routine x 2)     Status: Abnormal   Collection Time: 04/17/21  5:17 PM   Specimen: Right Antecubital; Blood  Result Value Ref Range Status   Specimen Description   Final    RIGHT ANTECUBITAL Performed at [place] [address] [place], [place] [address]    Special Requests   Final    BOTTLES DRAWN AEROBIC AND ANAEROBIC Blood Culture results  may not be optimal due to an inadequate volume of blood received in culture bottles Performed at [place] [address], [place], [place] [address]    Culture  Setup Time   Final    GRAM POSITIVE COCCI IN CLUSTERS IN BOTH AEROBIC AND ANAEROBIC BOTTLES CRITICAL RESULT CALLED TO, READ BACK BY AND VERIFIED WITH: E,WILLIAMSON PHARMD@[email or web address]  04/18/21 EB Performed at [facility], [place] [address] [place], [place] [address]    Culture STAPHYLOCOCCUS AUREUS (A)  Final   Report Status 04/20/2021 FINAL  Final   Organism ID, Bacteria STAPHYLOCOCCUS AUREUS  Final      Susceptibility   Staphylococcus aureus - MIC*    CIPROFLOXACIN >=8 RESISTANT Resistant     ERYTHROMYCIN >=8 RESISTANT Resistant     GENTAMICIN <=0.5 SENSITIVE Sensitive     OXACILLIN 1 SENSITIVE Sensitive     TETRACYCLINE >=16 RESISTANT Resistant     VANCOMYCIN 1 SENSITIVE Sensitive     TRIMETH/SULFA <=10 SENSITIVE Sensitive     CLINDAMYCIN RESISTANT Resistant     RIFAMPIN <=0.5 SENSITIVE Sensitive     Inducible Clindamycin POSITIVE Resistant     * STAPHYLOCOCCUS AUREUS  Blood Culture ID Panel (Reflexed)     Status: Abnormal   Collection Time: 04/17/21  5:17 PM  Result Value Ref Range Status   Enterococcus faecalis NOT DETECTED NOT DETECTED Final   Enterococcus Faecium NOT DETECTED NOT DETECTED Final   Listeria monocytogenes NOT DETECTED NOT DETECTED Final   Staphylococcus species DETECTED (A) NOT DETECTED Final    Comment: CRITICAL RESULT CALLED TO, READ BACK BY AND VERIFIED WITH: E,WILLIAMSON PHARMD@[email or web address]  04/18/21 EB    Staphylococcus aureus (BCID) DETECTED (A) NOT DETECTED Final    Comment: CRITICAL RESULT CALLED TO, READ BACK BY AND VERIFIED WITH: E,WILLIAMSON PHARMD@[email or web address]  04/18/21 EB    Staphylococcus epidermidis NOT DETECTED NOT DETECTED Final   Staphylococcus lugdunensis NOT DETECTED NOT DETECTED Final   Streptococcus  species NOT DETECTED NOT DETECTED Final   Streptococcus agalactiae NOT  DETECTED NOT DETECTED Final   Streptococcus pneumoniae NOT DETECTED NOT DETECTED Final   Streptococcus pyogenes NOT DETECTED NOT DETECTED Final   A.calcoaceticus-baumannii NOT DETECTED NOT DETECTED Final   Bacteroides fragilis NOT DETECTED NOT DETECTED Final   Enterobacterales NOT DETECTED NOT DETECTED Final   Enterobacter cloacae complex NOT DETECTED NOT DETECTED Final   Escherichia coli NOT DETECTED NOT DETECTED Final   Klebsiella aerogenes NOT DETECTED NOT DETECTED Final   Klebsiella oxytoca NOT DETECTED NOT DETECTED Final   Klebsiella pneumoniae NOT DETECTED NOT DETECTED Final   Proteus species NOT DETECTED NOT DETECTED Final   Salmonella species NOT DETECTED NOT DETECTED Final   Serratia marcescens NOT DETECTED NOT DETECTED Final   Haemophilus influenzae NOT DETECTED NOT DETECTED Final   Neisseria meningitidis NOT DETECTED NOT DETECTED Final   Pseudomonas aeruginosa NOT DETECTED NOT DETECTED Final   Stenotrophomonas maltophilia NOT DETECTED NOT DETECTED Final   Candida albicans NOT DETECTED NOT DETECTED Final   Candida auris NOT DETECTED NOT DETECTED Final   Candida glabrata NOT DETECTED NOT DETECTED Final   Candida krusei NOT DETECTED NOT DETECTED Final   Candida parapsilosis NOT DETECTED NOT DETECTED Final   Candida tropicalis NOT DETECTED NOT DETECTED Final   Cryptococcus neoformans/gattii NOT DETECTED NOT DETECTED Final   Meth resistant mecA/C and MREJ NOT DETECTED NOT DETECTED Final    Comment: Performed at [facility], [address] [place], [place] [address]  Surgical PCR screen     Status: None   Collection Time: 04/18/21  5:49 AM   Specimen: Nasal Mucosa; Nasal Swab  Result Value Ref Range Status   MRSA, PCR NEGATIVE NEGATIVE Final   Staphylococcus aureus NEGATIVE NEGATIVE Final    Comment: (NOTE) The Xpert SA Assay (FDA approved for NASAL specimens in patients 2 years of age and older), is one component of a comprehensive surveillance program. It is not  intended to diagnose infection nor to guide or monitor treatment. Performed at [facility], [place] [address], [place], [place] [address]   Culture, blood (routine x 2)     Status: None (Preliminary result)   Collection Time: 04/21/21  3:32 AM   Specimen: BLOOD  Result Value Ref Range Status   Specimen Description   Final    BLOOD BLOOD LEFT ARM Performed at [place] [address] [place], [place] [address]    Special Requests   Final    BOTTLES DRAWN AEROBIC ONLY Blood Culture adequate volume Performed at [place] [address], [place], [place] [address]    Culture   Final    NO GROWTH 4 DAYS Performed at [facility], [place] [address], [place], [place] [address]    Report Status PENDING  Incomplete  Culture, blood (routine x 2)     Status: None (Preliminary result)   Collection Time: 04/21/21  3:32 AM   Specimen: BLOOD LEFT HAND  Result Value Ref Range Status   Specimen Description   Final    BLOOD LEFT HAND Performed at [facility], [place] [address], [place], [place] [address]    Special Requests   Final    BOTTLES DRAWN AEROBIC ONLY Blood Culture adequate volume Performed at [place] [address], [place],  [address]    Culture   Final    NO GROWTH 4 DAYS Performed at [facility], [address] [place], [place] [address]    Report Status PENDING  Incomplete     Radiology Studies: No results found.   Marzetta Board, MD, PhD [facility]  Between 7 am - 7 pm I am available, please contact me via Amion (for emergencies) or Securechat (non urgent messages)  Between 7 pm - 7 am I am not available, please contact night coverage MD/APP via Amion

## 2021-04-25 NOTE — Progress Notes (Signed)
°  Subjective:  Patient ID: [name], male    DOB: 07/02/1970,  MRN: [id number]  Patient seen bedside. Pain controlled. Denies complaints at present. Objective:   Vitals:   04/25/21 0008 04/25/21 0538  BP: (!) 141/90 124/79  Pulse: 85 80  Resp: 18 18  Temp: 97.8 F (36.6 C) (!) 97.5 F (36.4 C)  SpO2: 100% 100%   General AA&O x3. Normal mood and affect.  Vascular Foot warm to touch  Neurologic Epicritic sensation grossly diminished.  Dermatologic Wound vac intact on at 125 mmHg. Marland Kitchen No pain with calf compression. Minimal edema. No active bleeding or draiange.  Orthopedic: +Motor to residual foot   Assessment & Plan:  Patient was evaluated and treated and all questions answered.  RLE osteomyelitis s/p Chopart amputation -Wound vac applied and on at 125 mmHg. Will keep intact.  -NWB RLE -Labs reviewed. No WBC from today,  yesterday up to 16.9 . Will trend. -Continue empiric abx. Believed surgical cure of OM.  -Patient awaiting TEE on Monday to determine antibiotic course.  -Patient will follow-up in clinic upon discharged.  [place] for discharge from podiatry standpoint.   Will continue to follow    Lorenda Peck, MD  Accessible via secure chat for questions or concerns.

## 2021-04-26 DIAGNOSIS — A419 Sepsis, unspecified organism: Secondary | ICD-10-CM | POA: Diagnosis not present

## 2021-04-26 DIAGNOSIS — N179 Acute kidney failure, unspecified: Secondary | ICD-10-CM | POA: Diagnosis not present

## 2021-04-26 DIAGNOSIS — R6520 Severe sepsis without septic shock: Secondary | ICD-10-CM | POA: Diagnosis not present

## 2021-04-26 DIAGNOSIS — N189 Chronic kidney disease, unspecified: Secondary | ICD-10-CM | POA: Diagnosis not present

## 2021-04-26 LAB — CULTURE, BLOOD (ROUTINE X 2)
Culture: NO GROWTH
Culture: NO GROWTH
Special Requests: ADEQUATE
Special Requests: ADEQUATE

## 2021-04-26 LAB — CBC
HCT: 25.8 % — ABNORMAL LOW (ref 39.0–52.0)
Hemoglobin: 8.1 g/dL — ABNORMAL LOW (ref 13.0–17.0)
MCH: 27.5 pg (ref 26.0–34.0)
MCHC: 31.4 g/dL (ref 30.0–36.0)
MCV: 87.5 fL (ref 80.0–100.0)
Platelets: 380 10*3/uL (ref 150–400)
RBC: 2.95 MIL/uL — ABNORMAL LOW (ref 4.22–5.81)
RDW: 14.3 % (ref 11.5–15.5)
WBC: 13.2 10*3/uL — ABNORMAL HIGH (ref 4.0–10.5)
nRBC: 0 % (ref 0.0–0.2)

## 2021-04-26 LAB — GLUCOSE, CAPILLARY
Glucose-Capillary: 80 mg/dL (ref 70–99)
Glucose-Capillary: 95 mg/dL (ref 70–99)
Glucose-Capillary: 87 mg/dL (ref 70–99)
Glucose-Capillary: 119 mg/dL — ABNORMAL HIGH (ref 70–99)

## 2021-04-26 LAB — COMPREHENSIVE METABOLIC PANEL
ALT: 6 U/L (ref 0–44)
AST: 23 U/L (ref 15–41)
Albumin: 2.4 g/dL — ABNORMAL LOW (ref 3.5–5.0)
Alkaline Phosphatase: 69 U/L (ref 38–126)
Anion gap: 5 (ref 5–15)
BUN: 30 mg/dL — ABNORMAL HIGH (ref 6–20)
CO2: 21 mmol/L — ABNORMAL LOW (ref 22–32)
Calcium: 8 mg/dL — ABNORMAL LOW (ref 8.9–10.3)
Chloride: 110 mmol/L (ref 98–111)
Creatinine, Ser: 2.74 mg/dL — ABNORMAL HIGH (ref 0.61–1.24)
GFR, Estimated: 27 mL/min — ABNORMAL LOW (ref >60)
Glucose, Bld: 96 mg/dL (ref 70–99)
Potassium: 4.3 mmol/L (ref 3.5–5.1)
Sodium: 136 mmol/L (ref 135–145)
Total Bilirubin: 0.3 mg/dL (ref 0.3–1.2)
Total Protein: 6.9 g/dL (ref 6.5–8.1)

## 2021-04-26 MED ORDER — CEFAZOLIN SODIUM-DEXTROSE 2-4 GM/100ML-% IV SOLN
2.0000 g | Freq: Three times a day (TID) | INTRAVENOUS | Status: DC
  Administered 2021-04-26 – 2021-04-28 (×6): 2 g via INTRAVENOUS
  Filled 2021-04-26 (×8): qty 100

## 2021-04-26 NOTE — Progress Notes (Signed)
Spoke with Dr Lorenda Peck making her aware that wound vac alarming "blockage", and that I believe it was coming from the tape on foot. Order received allowing to nurse remove dsg and obstruction, and redress Rt foot.

## 2021-04-26 NOTE — Progress Notes (Signed)
PHARMACY NOTE:  ANTIMICROBIAL RENAL DOSAGE ADJUSTMENT  Current antimicrobial regimen includes a mismatch between antimicrobial dosage and estimated renal function.  As per policy approved by the Pharmacy & Therapeutics and Medical Executive Committees, the antimicrobial dosage will be adjusted accordingly.  Current antimicrobial dosage:  Cefazolin 2 g IV q12h  Indication: MSSA Bacteremia  Renal Function:  Estimated Creatinine Clearance: 32.1 mL/min (A) (by C-G formula based on SCr of 2.74 mg/dL (H)). []      On intermittent HD, scheduled: []      On CRRT    Antimicrobial dosage has been changed to:  Cefazolin 2 g IV q8h  Lenis Noon, [facility] 04/26/2021 1:44 PM

## 2021-04-26 NOTE — H&P (View-Only) (Signed)
PROGRESS NOTE  [name] LEX:[id number] DOB: March 23, 1971 DOA: 04/17/2021 PCP: Camillia Herter, NP   LOS: 9 days   Brief Narrative / Interim history: 51 year old male with history of DM2, HTN, CKD 3B, recurrent right foot wound seen by podiatry as an outpatient comes into the hospital due to worsening of his wound.  He was sent here from his podiatrist office.  Blood cultures have returned positive for MSSA and ID was consulted.  He was taken to the OR on 1/25 and underwent final debridement.  Given MSSA bacteremia a TEE is pending for Monday.  Subjective / 24h Interval events: -Complains about his wound VAC making a lot of noises all night long  Assessment & Plan: Principal problem Severe sepsis due to MSSA bacteremia -source from his right foot.  Podiatry and ID following.  He met sepsis criteria on admission with tachycardia, increased WBC as well as acute kidney injury.  He was initially placed on vancomycin, now transition to cefazolin.  Sepsis physiology resolved.  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  He is awaiting a TEE on Monday.  If no vegetation, he will be placed on oral antibiotics to complete a short course, if has evidence of endocarditis will need PICC line and 6 weeks of IV antibiotics.  Currently has a wound VAC in place  Active problems Acute kidney injury, chronic kidney disease 3B-Baseline creatinine 2.4-2.6, initially improved but increased again in the 3 range on Saturday.  Received IV fluids, returned to baseline this morning, creatinine 2.7.  Encouraged p.o. 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  BPH-continue Flomax  DM2, diabetic neuropathy-continue sliding scale, gabapentin  CBG (last  3)  Recent Labs    04/25/21 1654 04/25/21 2116 04/26/21 0738  GLUCAP 116* 133* 80    Scheduled Meds:  acetaminophen  1,000 mg Oral TID   amLODipine  10 mg Oral Daily   vitamin C  500 mg Oral Daily   aspirin EC  81 mg Oral q morning   atorvastatin  20 mg Oral Daily   carvedilol  25 mg Oral BID   cloNIDine  0.1 mg Oral BID   ferrous sulfate  325 mg Oral Q breakfast   gabapentin  300 mg Oral BID   insulin aspart  0-9 Units Subcutaneous TID WC   multivitamin with minerals  1 tablet Oral Daily   ondansetron (ZOFRAN) IV  4 mg Intravenous Q6H   polyethylene glycol  17 g Oral Daily   Ensure Max Protein  11 oz Oral BID   senna-docusate  1 tablet Oral BID   tamsulosin  0.4 mg Oral QPC supper   Continuous Infusions:  sodium chloride 100 mL/hr at 04/25/21 1629    ceFAZolin (ANCEF) IV 2 g (04/25/21 2201)   PRN Meds:.oxyCODONE  Diet Orders (From admission, onward)     Start     Ordered   04/27/21 0001  Diet NPO time specified Except for: Sips with Meds  Diet effective ____       Comments: Patient to remain NPO until fully awake following the TEE.  Question:  Except for  Answer:  Sips with Meds   04/24/21 1225   04/22/21 1554  Diet Carb Modified Fluid consistency: Thin; Room service appropriate? Yes  Diet effective now  Question Answer Comment  Diet-HS Snack? Nothing   Calorie Level Medium 1600-2000   Fluid consistency: Thin   Room service appropriate? Yes      04/22/21 1553            DVT prophylaxis: SCDs Start: 04/17/21 2124   Lab Results  Component Value Date   PLT 380 04/26/2021      Code Status: Full Code  Family Communication: No family at bedside  Status is: Inpatient  Remains inpatient appropriate because: Awaiting TEE  Level of care: Med-Surg  Consultants:  Podiatry ID  Microbiology  MSSA bacteremia on cultures 1/20 Surveillance culture 1/24-no growth at 5 days, final  Antimicrobials: Cefazolin   Objective: Vitals:   04/25/21 1009  04/25/21 1316 04/25/21 2114 04/26/21 0700  BP: 130/78 133/83 (!) 146/87 (!) 142/82  Pulse:  83 84 82  Resp:  16 18 16   Temp:  97.8 F (36.6 C) 98 F (36.7 C) 98.8 F (37.1 C)  TempSrc:   Oral Oral  SpO2:  100% 99% 100%  Weight:      Height:        Intake/Output Summary (Last 24 hours) at 04/26/2021 0945 Last data filed at 04/26/2021 0758 Gross per 24 hour  Intake 3823.6 ml  Output 2250 ml  Net 1573.6 ml    Wt Readings from Last 3 Encounters:  04/17/21 88.9 kg  04/01/21 86.4 kg  03/03/21 89.4 kg    Examination:  Constitutional: NAD Eyes: lids and conjunctivae normal, no scleral icterus ENMT: mmm Neck: normal, supple Respiratory: clear to auscultation bilaterally, no wheezing, no crackles. Normal respiratory effort.  Cardiovascular: Regular rate and rhythm, no murmurs / rubs / gallops. No LE edema. Abdomen: soft, no distention, no tenderness. Bowel sounds positive.  Skin: no rashes Neurologic: no focal deficits, equal strength   Data Reviewed: I have independently reviewed following labs and imaging studies  CBC Recent Labs  Lab 04/20/21 0325 04/22/21 0324 04/24/21 0355 04/26/21 0440  WBC 14.2* 14.4* 16.8*   16.9* 13.2*  HGB 8.3* 8.2* 8.0*   7.9* 8.1*  HCT 26.1* 27.3* 25.5*   25.7* 25.8*  PLT 309 343 393   389 380  MCV 87.0 88.1 88.2   87.4 87.5  MCH 27.7 26.5 27.7   26.9 27.5  MCHC 31.8 30.0 31.4   30.7 31.4  RDW 14.1 14.1 14.5   14.3 14.3  LYMPHSABS 2.0  --  3.4  --   MONOABS 0.8  --  1.3*  --   EOSABS 0.4  --  0.6*  --   BASOSABS 0.0  --  0.1  --      Recent Labs  Lab 04/20/21 0325 04/21/21 0332 04/22/21 0324 04/24/21 0321 04/25/21 0450 04/26/21 0440  NA 132* 134* 135 132* 135 136  K 4.1 4.0 4.4 4.5 4.3 4.3  CL 106 104 107 102 105 110  CO2 21* 22 21* 22 22 21*  GLUCOSE 99 125* 135* 118* 111* 96  BUN 26* 21* 29* 33* 36* 30*  CREATININE 2.50* 2.66* 2.68* 3.43* 3.37* 2.74*  CALCIUM 7.8* 8.1* 8.3* 8.1* 8.5* 8.0*  AST 11*  --   --   --   --   23  ALT 9  --   --   --   --  6  ALKPHOS 60  --   --   --   --  69  BILITOT 0.4  --   --   --   --  0.3  ALBUMIN 2.3* 2.4*  --   --   --  2.4*     ------------------------------------------------------------------------------------------------------------------ No results for input(s): CHOL, HDL, LDLCALC, TRIG, CHOLHDL, LDLDIRECT in the last 72 hours.  Lab Results  Component Value Date   HGBA1C 6.3 (H) 04/01/2021   ------------------------------------------------------------------------------------------------------------------ No results for input(s): TSH, T4TOTAL, T3FREE, THYROIDAB in the last 72 hours.  Invalid input(s): FREET3  Cardiac Enzymes No results for input(s): CKMB, TROPONINI, MYOGLOBIN in the last 168 hours.  Invalid input(s): CK ------------------------------------------------------------------------------------------------------------------ No results found for: BNP  CBG: Recent Labs  Lab 04/25/21 0744 04/25/21 1144 04/25/21 1654 04/25/21 2116 04/26/21 0738  GLUCAP 139* 185* 116* 133* 80     Recent Results (from the past 240 hour(s))  WOUND CULTURE     Status: Abnormal   Collection Time: 04/17/21  3:03 PM  Result Value Ref Range Status   MICRO NUMBER: [id number]  Final   SPECIMEN QUALITY: Adequate  Final   SOURCE: R FOOT  Final   STATUS: FINAL  Final   GRAM STAIN:   Final    No epithelial cells seen Few Polymorphonuclear leukocytes Few Gram positive cocci   ISOLATE 1: Staphylococcus aureus (A)  Final    Comment: Heavy growth of Staphylococcus aureus      Susceptibility   Staphylococcus aureus - AEROBIC CULT, GRAM STAIN POSITIVE 1    VANCOMYCIN 1 Sensitive     CIPROFLOXACIN <=0.5 Sensitive     CLINDAMYCIN <=0.25 Sensitive     LEVOFLOXACIN 0.25 Sensitive     ERYTHROMYCIN <=0.25 Sensitive     GENTAMICIN <=0.5 Sensitive     OXACILLIN* <=0.25 Sensitive      * Oxacillin susceptible staphylococci are susceptible to other  penicillinase-stable penicillins (e.g., methicillin, nafcillin), beta- lactam/beta-lactamase inhibitor combinations, and cephems with staphylococcal indications, including cefazolin.     TETRACYCLINE <=1 Sensitive     TRIMETH/SULFA* <=10 Sensitive      * Oxacillin susceptible staphylococci are susceptible to other penicillinase-stable penicillins (e.g., methicillin, nafcillin), beta- lactam/beta-lactamase inhibitor combinations, and cephems with staphylococcal indications, including cefazolin. Legend: S = Susceptible  I = Intermediate R = Resistant  NS = Not susceptible * = Not tested  NR = Not reported **NN = See antimicrobic comments   Resp Panel by RT-PCR (Flu A&B, Covid) Nasopharyngeal Swab     Status: None   Collection Time: 04/17/21  5:17 PM   Specimen: Nasopharyngeal Swab; Nasopharyngeal(NP) swabs in vial transport medium  Result Value Ref Range Status   SARS Coronavirus 2 by RT PCR NEGATIVE NEGATIVE Final    Comment: (NOTE) SARS-CoV-2 target nucleic acids are NOT DETECTED.  The SARS-CoV-2 RNA is generally detectable in upper respiratory specimens during the acute phase of infection. The lowest concentration of SARS-CoV-2 viral copies this assay can detect is 138 copies/mL. A negative result does not preclude SARS-Cov-2 infection and should not be used as the sole basis for treatment or other patient management decisions. A negative result may occur with  improper specimen collection/handling, submission of specimen other than nasopharyngeal swab, presence of viral mutation(s) within the areas targeted by this assay, and inadequate number of viral copies(<138 copies/mL). A negative result must be combined with clinical observations, patient history, and epidemiological information. The expected result is Negative.  Fact Sheet for Patients:  EntrepreneurPulse.com.au  Fact Sheet for Healthcare Providers:   IncredibleEmployment.be  This test is no t yet approved or cleared by the [place] FDA and  has been authorized for detection and/or diagnosis of SARS-CoV-2 by FDA  under an Emergency Use Authorization (EUA). This EUA will remain  in effect (meaning this test can be used) for the duration of the COVID-19 declaration under Section 564(b)(1) of the Act, 21 U.S.C.section 360bbb-3(b)(1), unless the authorization is terminated  or revoked sooner.       Influenza A by PCR NEGATIVE NEGATIVE Final   Influenza B by PCR NEGATIVE NEGATIVE Final    Comment: (NOTE) The Xpert Xpress SARS-CoV-2/FLU/RSV plus assay is intended as an aid in the diagnosis of influenza from Nasopharyngeal swab specimens and should not be used as a sole basis for treatment. Nasal washings and aspirates are unacceptable for Xpert Xpress SARS-CoV-2/FLU/RSV testing.  Fact Sheet for Patients: EntrepreneurPulse.com.au  Fact Sheet for Healthcare Providers: IncredibleEmployment.be  This test is not yet approved or cleared by the [place] FDA and has been authorized for detection and/or diagnosis of SARS-CoV-2 by FDA under an Emergency Use Authorization (EUA). This EUA will remain in effect (meaning this test can be used) for the duration of the COVID-19 declaration under Section 564(b)(1) of the Act, 21 U.S.C. section 360bbb-3(b)(1), unless the authorization is terminated or revoked.  Performed at [facility], [place] [address], [place], [place] [address]   Culture, blood (routine x 2)     Status: Abnormal   Collection Time: 04/17/21  5:17 PM   Specimen: Right Antecubital; Blood  Result Value Ref Range Status   Specimen Description   Final    RIGHT ANTECUBITAL Performed at [facility] [address], [place], [place] [address]    Special Requests   Final    BOTTLES DRAWN AEROBIC AND ANAEROBIC Blood Culture results  may not be optimal due to an inadequate volume of blood received in culture bottles Performed at [facility] [address], [place], [place] [address]    Culture  Setup Time   Final    GRAM POSITIVE COCCI IN CLUSTERS IN BOTH AEROBIC AND ANAEROBIC BOTTLES CRITICAL RESULT CALLED TO, READ BACK BY AND VERIFIED WITH: E,WILLIAMSON PHARMD@[email or web address]  04/18/21 EB Performed at [facility], [place] [address], [place], [place] [address]    Culture STAPHYLOCOCCUS AUREUS (A)  Final   Report Status 04/20/2021 FINAL  Final   Organism ID, Bacteria STAPHYLOCOCCUS AUREUS  Final      Susceptibility   Staphylococcus aureus - MIC*    CIPROFLOXACIN >=8 RESISTANT Resistant     ERYTHROMYCIN >=8 RESISTANT Resistant     GENTAMICIN <=0.5 SENSITIVE Sensitive     OXACILLIN 1 SENSITIVE Sensitive     TETRACYCLINE >=16 RESISTANT Resistant     VANCOMYCIN 1 SENSITIVE Sensitive     TRIMETH/SULFA <=10 SENSITIVE Sensitive     CLINDAMYCIN RESISTANT Resistant     RIFAMPIN <=0.5 SENSITIVE Sensitive     Inducible Clindamycin POSITIVE Resistant     * STAPHYLOCOCCUS AUREUS  Blood Culture ID Panel (Reflexed)     Status: Abnormal   Collection Time: 04/17/21  5:17 PM  Result Value Ref Range Status   Enterococcus faecalis NOT DETECTED NOT DETECTED Final   Enterococcus Faecium NOT DETECTED NOT DETECTED Final   Listeria monocytogenes NOT DETECTED NOT DETECTED Final   Staphylococcus species DETECTED (A) NOT DETECTED Final    Comment: CRITICAL RESULT CALLED TO, READ BACK BY AND VERIFIED WITH: E,WILLIAMSON PHARMD@[email or web address]  04/18/21 EB    Staphylococcus aureus (BCID) DETECTED (A) NOT DETECTED Final    Comment: CRITICAL RESULT CALLED TO, READ BACK BY AND VERIFIED WITH: E,WILLIAMSON PHARMD@[email or web address]  04/18/21 EB  Staphylococcus epidermidis NOT DETECTED NOT DETECTED Final   Staphylococcus lugdunensis NOT DETECTED NOT DETECTED Final   Streptococcus species NOT DETECTED NOT DETECTED Final   Streptococcus agalactiae NOT  DETECTED NOT DETECTED Final   Streptococcus pneumoniae NOT DETECTED NOT DETECTED Final   Streptococcus pyogenes NOT DETECTED NOT DETECTED Final   A.calcoaceticus-baumannii NOT DETECTED NOT DETECTED Final   Bacteroides fragilis NOT DETECTED NOT DETECTED Final   Enterobacterales NOT DETECTED NOT DETECTED Final   Enterobacter cloacae complex NOT DETECTED NOT DETECTED Final   Escherichia coli NOT DETECTED NOT DETECTED Final   Klebsiella aerogenes NOT DETECTED NOT DETECTED Final   Klebsiella oxytoca NOT DETECTED NOT DETECTED Final   Klebsiella pneumoniae NOT DETECTED NOT DETECTED Final   Proteus species NOT DETECTED NOT DETECTED Final   Salmonella species NOT DETECTED NOT DETECTED Final   Serratia marcescens NOT DETECTED NOT DETECTED Final   Haemophilus influenzae NOT DETECTED NOT DETECTED Final   Neisseria meningitidis NOT DETECTED NOT DETECTED Final   Pseudomonas aeruginosa NOT DETECTED NOT DETECTED Final   Stenotrophomonas maltophilia NOT DETECTED NOT DETECTED Final   Candida albicans NOT DETECTED NOT DETECTED Final   Candida auris NOT DETECTED NOT DETECTED Final   Candida glabrata NOT DETECTED NOT DETECTED Final   Candida krusei NOT DETECTED NOT DETECTED Final   Candida parapsilosis NOT DETECTED NOT DETECTED Final   Candida tropicalis NOT DETECTED NOT DETECTED Final   Cryptococcus neoformans/gattii NOT DETECTED NOT DETECTED Final   Meth resistant mecA/C and MREJ NOT DETECTED NOT DETECTED Final    Comment: Performed at [facility], [address], [place], [place] [address]  Surgical PCR screen     Status: None   Collection Time: 04/18/21  5:49 AM   Specimen: Nasal Mucosa; Nasal Swab  Result Value Ref Range Status   MRSA, PCR NEGATIVE NEGATIVE Final   Staphylococcus aureus NEGATIVE NEGATIVE Final    Comment: (NOTE) The Xpert SA Assay (FDA approved for NASAL specimens in patients 61 years of age and older), is one component of a comprehensive surveillance program. It is not  intended to diagnose infection nor to guide or monitor treatment. Performed at [facility], [place] [address], [place], [place] [address]   Culture, blood (routine x 2)     Status: None   Collection Time: 04/21/21  3:32 AM   Specimen: BLOOD  Result Value Ref Range Status   Specimen Description   Final    BLOOD BLOOD LEFT ARM Performed at [facility] [address], [place], [place] [address]    Special Requests   Final    BOTTLES DRAWN AEROBIC ONLY Blood Culture adequate volume Performed at [facility] [address] [place], [place] [address]    Culture   Final    NO GROWTH 5 DAYS Performed at [facility], [place] [address] [place], [place] [address]    Report Status 04/26/2021 FINAL  Final  Culture, blood (routine x 2)     Status: None   Collection Time: 04/21/21  3:32 AM   Specimen: BLOOD LEFT HAND  Result Value Ref Range Status   Specimen Description   Final    BLOOD LEFT HAND Performed at [facility], [place] [address], [name], [place] [address]    Special Requests   Final    BOTTLES DRAWN AEROBIC ONLY Blood Culture adequate volume Performed at [facility] [address], [place], [place] [address]  Culture   Final    NO GROWTH 5 DAYS Performed at [facility], [place] [address] [place], [place] [address]    Report Status 04/26/2021 FINAL  Final      Radiology Studies: No results found.   Marzetta Board, MD, PhD [facility]  Between 7 am - 7 pm I am available, please contact me via Amion (for emergencies) or Securechat (non urgent messages)  Between 7 pm - 7 am I am not available, please contact night coverage MD/APP via Amion

## 2021-04-26 NOTE — Progress Notes (Signed)
PROGRESS NOTE  [name] LEX:[id number] DOB: March 23, 1971 DOA: 04/17/2021 PCP: Camillia Herter, NP   LOS: 9 days   Brief Narrative / Interim history: 51 year old male with history of DM2, HTN, CKD 3B, recurrent right foot wound seen by podiatry as an outpatient comes into the hospital due to worsening of his wound.  He was sent here from his podiatrist office.  Blood cultures have returned positive for MSSA and ID was consulted.  He was taken to the OR on 1/25 and underwent final debridement.  Given MSSA bacteremia a TEE is pending for Monday.  Subjective / 24h Interval events: -Complains about his wound VAC making a lot of noises all night long  Assessment & Plan: Principal problem Severe sepsis due to MSSA bacteremia -source from his right foot.  Podiatry and ID following.  He met sepsis criteria on admission with tachycardia, increased WBC as well as acute kidney injury.  He was initially placed on vancomycin, now transition to cefazolin.  Sepsis physiology resolved.  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  He is awaiting a TEE on Monday.  If no vegetation, he will be placed on oral antibiotics to complete a short course, if has evidence of endocarditis will need PICC line and 6 weeks of IV antibiotics.  Currently has a wound VAC in place  Active problems Acute kidney injury, chronic kidney disease 3B-Baseline creatinine 2.4-2.6, initially improved but increased again in the 3 range on Saturday.  Received IV fluids, returned to baseline this morning, creatinine 2.7.  Encouraged p.o. 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  BPH-continue Flomax  DM2, diabetic neuropathy-continue sliding scale, gabapentin  CBG (last  3)  Recent Labs    04/25/21 1654 04/25/21 2116 04/26/21 0738  GLUCAP 116* 133* 80    Scheduled Meds:  acetaminophen  1,000 mg Oral TID   amLODipine  10 mg Oral Daily   vitamin C  500 mg Oral Daily   aspirin EC  81 mg Oral q morning   atorvastatin  20 mg Oral Daily   carvedilol  25 mg Oral BID   cloNIDine  0.1 mg Oral BID   ferrous sulfate  325 mg Oral Q breakfast   gabapentin  300 mg Oral BID   insulin aspart  0-9 Units Subcutaneous TID WC   multivitamin with minerals  1 tablet Oral Daily   ondansetron (ZOFRAN) IV  4 mg Intravenous Q6H   polyethylene glycol  17 g Oral Daily   Ensure Max Protein  11 oz Oral BID   senna-docusate  1 tablet Oral BID   tamsulosin  0.4 mg Oral QPC supper   Continuous Infusions:  sodium chloride 100 mL/hr at 04/25/21 1629    ceFAZolin (ANCEF) IV 2 g (04/25/21 2201)   PRN Meds:.oxyCODONE  Diet Orders (From admission, onward)     Start     Ordered   04/27/21 0001  Diet NPO time specified Except for: Sips with Meds  Diet effective ____       Comments: Patient to remain NPO until fully awake following the TEE.  Question:  Except for  Answer:  Sips with Meds   04/24/21 1225   04/22/21 1554  Diet Carb Modified Fluid consistency: Thin; Room service appropriate? Yes  Diet effective now  Question Answer Comment  Diet-HS Snack? Nothing   Calorie Level Medium 1600-2000   Fluid consistency: Thin   Room service appropriate? Yes      04/22/21 1553            DVT prophylaxis: SCDs Start: 04/17/21 2124   Lab Results  Component Value Date   PLT 380 04/26/2021      Code Status: Full Code  Family Communication: No family at bedside  Status is: Inpatient  Remains inpatient appropriate because: Awaiting TEE  Level of care: Med-Surg  Consultants:  Podiatry ID  Microbiology  MSSA bacteremia on cultures 1/20 Surveillance culture 1/24-no growth at 5 days, final  Antimicrobials: Cefazolin   Objective: Vitals:   04/25/21 1009  04/25/21 1316 04/25/21 2114 04/26/21 0700  BP: 130/78 133/83 (!) 146/87 (!) 142/82  Pulse:  83 84 82  Resp:  16 18 16   Temp:  97.8 F (36.6 C) 98 F (36.7 C) 98.8 F (37.1 C)  TempSrc:   Oral Oral  SpO2:  100% 99% 100%  Weight:      Height:        Intake/Output Summary (Last 24 hours) at 04/26/2021 0945 Last data filed at 04/26/2021 0758 Gross per 24 hour  Intake 3823.6 ml  Output 2250 ml  Net 1573.6 ml    Wt Readings from Last 3 Encounters:  04/17/21 88.9 kg  04/01/21 86.4 kg  03/03/21 89.4 kg    Examination:  Constitutional: NAD Eyes: lids and conjunctivae normal, no scleral icterus ENMT: mmm Neck: normal, supple Respiratory: clear to auscultation bilaterally, no wheezing, no crackles. Normal respiratory effort.  Cardiovascular: Regular rate and rhythm, no murmurs / rubs / gallops. No LE edema. Abdomen: soft, no distention, no tenderness. Bowel sounds positive.  Skin: no rashes Neurologic: no focal deficits, equal strength   Data Reviewed: I have independently reviewed following labs and imaging studies  CBC Recent Labs  Lab 04/20/21 0325 04/22/21 0324 04/24/21 0355 04/26/21 0440  WBC 14.2* 14.4* 16.8*   16.9* 13.2*  HGB 8.3* 8.2* 8.0*   7.9* 8.1*  HCT 26.1* 27.3* 25.5*   25.7* 25.8*  PLT 309 343 393   389 380  MCV 87.0 88.1 88.2   87.4 87.5  MCH 27.7 26.5 27.7   26.9 27.5  MCHC 31.8 30.0 31.4   30.7 31.4  RDW 14.1 14.1 14.5   14.3 14.3  LYMPHSABS 2.0  --  3.4  --   MONOABS 0.8  --  1.3*  --   EOSABS 0.4  --  0.6*  --   BASOSABS 0.0  --  0.1  --      Recent Labs  Lab 04/20/21 0325 04/21/21 0332 04/22/21 0324 04/24/21 0321 04/25/21 0450 04/26/21 0440  NA 132* 134* 135 132* 135 136  K 4.1 4.0 4.4 4.5 4.3 4.3  CL 106 104 107 102 105 110  CO2 21* 22 21* 22 22 21*  GLUCOSE 99 125* 135* 118* 111* 96  BUN 26* 21* 29* 33* 36* 30*  CREATININE 2.50* 2.66* 2.68* 3.43* 3.37* 2.74*  CALCIUM 7.8* 8.1* 8.3* 8.1* 8.5* 8.0*  AST 11*  --   --   --   --   23  ALT 9  --   --   --   --  6  ALKPHOS 60  --   --   --   --  69  BILITOT 0.4  --   --   --   --  0.3  ALBUMIN 2.3* 2.4*  --   --   --  2.4*     ------------------------------------------------------------------------------------------------------------------ No results for input(s): CHOL, HDL, LDLCALC, TRIG, CHOLHDL, LDLDIRECT in the last 72 hours.  Lab Results  Component Value Date   HGBA1C 6.3 (H) 04/01/2021   ------------------------------------------------------------------------------------------------------------------ No results for input(s): TSH, T4TOTAL, T3FREE, THYROIDAB in the last 72 hours.  Invalid input(s): FREET3  Cardiac Enzymes No results for input(s): CKMB, TROPONINI, MYOGLOBIN in the last 168 hours.  Invalid input(s): CK ------------------------------------------------------------------------------------------------------------------ No results found for: BNP  CBG: Recent Labs  Lab 04/25/21 0744 04/25/21 1144 04/25/21 1654 04/25/21 2116 04/26/21 0738  GLUCAP 139* 185* 116* 133* 80     Recent Results (from the past 240 hour(s))  WOUND CULTURE     Status: Abnormal   Collection Time: 04/17/21  3:03 PM  Result Value Ref Range Status   MICRO NUMBER: [id number]  Final   SPECIMEN QUALITY: Adequate  Final   SOURCE: R FOOT  Final   STATUS: FINAL  Final   GRAM STAIN:   Final    No epithelial cells seen Few Polymorphonuclear leukocytes Few Gram positive cocci   ISOLATE 1: Staphylococcus aureus (A)  Final    Comment: Heavy growth of Staphylococcus aureus      Susceptibility   Staphylococcus aureus - AEROBIC CULT, GRAM STAIN POSITIVE 1    VANCOMYCIN 1 Sensitive     CIPROFLOXACIN <=0.5 Sensitive     CLINDAMYCIN <=0.25 Sensitive     LEVOFLOXACIN 0.25 Sensitive     ERYTHROMYCIN <=0.25 Sensitive     GENTAMICIN <=0.5 Sensitive     OXACILLIN* <=0.25 Sensitive      * Oxacillin susceptible staphylococci are susceptible to other  penicillinase-stable penicillins (e.g., methicillin, nafcillin), beta- lactam/beta-lactamase inhibitor combinations, and cephems with staphylococcal indications, including cefazolin.     TETRACYCLINE <=1 Sensitive     TRIMETH/SULFA* <=10 Sensitive      * Oxacillin susceptible staphylococci are susceptible to other penicillinase-stable penicillins (e.g., methicillin, nafcillin), beta- lactam/beta-lactamase inhibitor combinations, and cephems with staphylococcal indications, including cefazolin. Legend: S = Susceptible  I = Intermediate R = Resistant  NS = Not susceptible * = Not tested  NR = Not reported **NN = See antimicrobic comments   Resp Panel by RT-PCR (Flu A&B, Covid) Nasopharyngeal Swab     Status: None   Collection Time: 04/17/21  5:17 PM   Specimen: Nasopharyngeal Swab; Nasopharyngeal(NP) swabs in vial transport medium  Result Value Ref Range Status   SARS Coronavirus 2 by RT PCR NEGATIVE NEGATIVE Final    Comment: (NOTE) SARS-CoV-2 target nucleic acids are NOT DETECTED.  The SARS-CoV-2 RNA is generally detectable in upper respiratory specimens during the acute phase of infection. The lowest concentration of SARS-CoV-2 viral copies this assay can detect is 138 copies/mL. A negative result does not preclude SARS-Cov-2 infection and should not be used as the sole basis for treatment or other patient management decisions. A negative result may occur with  improper specimen collection/handling, submission of specimen other than nasopharyngeal swab, presence of viral mutation(s) within the areas targeted by this assay, and inadequate number of viral copies(<138 copies/mL). A negative result must be combined with clinical observations, patient history, and epidemiological information. The expected result is Negative.  Fact Sheet for Patients:  EntrepreneurPulse.com.au  Fact Sheet for Healthcare Providers:   IncredibleEmployment.be  This test is no t yet approved or cleared by the [place] FDA and  has been authorized for detection and/or diagnosis of SARS-CoV-2 by FDA  under an Emergency Use Authorization (EUA). This EUA will remain  in effect (meaning this test can be used) for the duration of the COVID-19 declaration under Section 564(b)(1) of the Act, 21 U.S.C.section 360bbb-3(b)(1), unless the authorization is terminated  or revoked sooner.       Influenza A by PCR NEGATIVE NEGATIVE Final   Influenza B by PCR NEGATIVE NEGATIVE Final    Comment: (NOTE) The Xpert Xpress SARS-CoV-2/FLU/RSV plus assay is intended as an aid in the diagnosis of influenza from Nasopharyngeal swab specimens and should not be used as a sole basis for treatment. Nasal washings and aspirates are unacceptable for Xpert Xpress SARS-CoV-2/FLU/RSV testing.  Fact Sheet for Patients: EntrepreneurPulse.com.au  Fact Sheet for Healthcare Providers: IncredibleEmployment.be  This test is not yet approved or cleared by the [place] FDA and has been authorized for detection and/or diagnosis of SARS-CoV-2 by FDA under an Emergency Use Authorization (EUA). This EUA will remain in effect (meaning this test can be used) for the duration of the COVID-19 declaration under Section 564(b)(1) of the Act, 21 U.S.C. section 360bbb-3(b)(1), unless the authorization is terminated or revoked.  Performed at [facility], [place] [address], [place], [place] [address]   Culture, blood (routine x 2)     Status: Abnormal   Collection Time: 04/17/21  5:17 PM   Specimen: Right Antecubital; Blood  Result Value Ref Range Status   Specimen Description   Final    RIGHT ANTECUBITAL Performed at [facility] [address], [place], [place] [address]    Special Requests   Final    BOTTLES DRAWN AEROBIC AND ANAEROBIC Blood Culture results  may not be optimal due to an inadequate volume of blood received in culture bottles Performed at [facility] [address], [place], [place] [address]    Culture  Setup Time   Final    GRAM POSITIVE COCCI IN CLUSTERS IN BOTH AEROBIC AND ANAEROBIC BOTTLES CRITICAL RESULT CALLED TO, READ BACK BY AND VERIFIED WITH: E,WILLIAMSON PHARMD@[email or web address]  04/18/21 EB Performed at [facility], [place] [address], [place], [place] [address]    Culture STAPHYLOCOCCUS AUREUS (A)  Final   Report Status 04/20/2021 FINAL  Final   Organism ID, Bacteria STAPHYLOCOCCUS AUREUS  Final      Susceptibility   Staphylococcus aureus - MIC*    CIPROFLOXACIN >=8 RESISTANT Resistant     ERYTHROMYCIN >=8 RESISTANT Resistant     GENTAMICIN <=0.5 SENSITIVE Sensitive     OXACILLIN 1 SENSITIVE Sensitive     TETRACYCLINE >=16 RESISTANT Resistant     VANCOMYCIN 1 SENSITIVE Sensitive     TRIMETH/SULFA <=10 SENSITIVE Sensitive     CLINDAMYCIN RESISTANT Resistant     RIFAMPIN <=0.5 SENSITIVE Sensitive     Inducible Clindamycin POSITIVE Resistant     * STAPHYLOCOCCUS AUREUS  Blood Culture ID Panel (Reflexed)     Status: Abnormal   Collection Time: 04/17/21  5:17 PM  Result Value Ref Range Status   Enterococcus faecalis NOT DETECTED NOT DETECTED Final   Enterococcus Faecium NOT DETECTED NOT DETECTED Final   Listeria monocytogenes NOT DETECTED NOT DETECTED Final   Staphylococcus species DETECTED (A) NOT DETECTED Final    Comment: CRITICAL RESULT CALLED TO, READ BACK BY AND VERIFIED WITH: E,WILLIAMSON PHARMD@[email or web address]  04/18/21 EB    Staphylococcus aureus (BCID) DETECTED (A) NOT DETECTED Final    Comment: CRITICAL RESULT CALLED TO, READ BACK BY AND VERIFIED WITH: E,WILLIAMSON PHARMD@[email or web address]  04/18/21 EB  Staphylococcus epidermidis NOT DETECTED NOT DETECTED Final   Staphylococcus lugdunensis NOT DETECTED NOT DETECTED Final   Streptococcus species NOT DETECTED NOT DETECTED Final   Streptococcus agalactiae NOT  DETECTED NOT DETECTED Final   Streptococcus pneumoniae NOT DETECTED NOT DETECTED Final   Streptococcus pyogenes NOT DETECTED NOT DETECTED Final   A.calcoaceticus-baumannii NOT DETECTED NOT DETECTED Final   Bacteroides fragilis NOT DETECTED NOT DETECTED Final   Enterobacterales NOT DETECTED NOT DETECTED Final   Enterobacter cloacae complex NOT DETECTED NOT DETECTED Final   Escherichia coli NOT DETECTED NOT DETECTED Final   Klebsiella aerogenes NOT DETECTED NOT DETECTED Final   Klebsiella oxytoca NOT DETECTED NOT DETECTED Final   Klebsiella pneumoniae NOT DETECTED NOT DETECTED Final   Proteus species NOT DETECTED NOT DETECTED Final   Salmonella species NOT DETECTED NOT DETECTED Final   Serratia marcescens NOT DETECTED NOT DETECTED Final   Haemophilus influenzae NOT DETECTED NOT DETECTED Final   Neisseria meningitidis NOT DETECTED NOT DETECTED Final   Pseudomonas aeruginosa NOT DETECTED NOT DETECTED Final   Stenotrophomonas maltophilia NOT DETECTED NOT DETECTED Final   Candida albicans NOT DETECTED NOT DETECTED Final   Candida auris NOT DETECTED NOT DETECTED Final   Candida glabrata NOT DETECTED NOT DETECTED Final   Candida krusei NOT DETECTED NOT DETECTED Final   Candida parapsilosis NOT DETECTED NOT DETECTED Final   Candida tropicalis NOT DETECTED NOT DETECTED Final   Cryptococcus neoformans/gattii NOT DETECTED NOT DETECTED Final   Meth resistant mecA/C and MREJ NOT DETECTED NOT DETECTED Final    Comment: Performed at [facility], [address], [place], [place] [address]  Surgical PCR screen     Status: None   Collection Time: 04/18/21  5:49 AM   Specimen: Nasal Mucosa; Nasal Swab  Result Value Ref Range Status   MRSA, PCR NEGATIVE NEGATIVE Final   Staphylococcus aureus NEGATIVE NEGATIVE Final    Comment: (NOTE) The Xpert SA Assay (FDA approved for NASAL specimens in patients 61 years of age and older), is one component of a comprehensive surveillance program. It is not  intended to diagnose infection nor to guide or monitor treatment. Performed at [facility], [place] [address], [place], [place] [address]   Culture, blood (routine x 2)     Status: None   Collection Time: 04/21/21  3:32 AM   Specimen: BLOOD  Result Value Ref Range Status   Specimen Description   Final    BLOOD BLOOD LEFT ARM Performed at [facility] [address], [place], [place] [address]    Special Requests   Final    BOTTLES DRAWN AEROBIC ONLY Blood Culture adequate volume Performed at [facility] [address] [place], [place] [address]    Culture   Final    NO GROWTH 5 DAYS Performed at [facility], [place] [address] [place], [place] [address]    Report Status 04/26/2021 FINAL  Final  Culture, blood (routine x 2)     Status: None   Collection Time: 04/21/21  3:32 AM   Specimen: BLOOD LEFT HAND  Result Value Ref Range Status   Specimen Description   Final    BLOOD LEFT HAND Performed at [facility], [place] [address], [name], [place] [address]    Special Requests   Final    BOTTLES DRAWN AEROBIC ONLY Blood Culture adequate volume Performed at [facility] [address], [place], [place] [address]  Culture   Final    NO GROWTH 5 DAYS Performed at [facility], [place] [address] [place], [place] [address]    Report Status 04/26/2021 FINAL  Final      Radiology Studies: No results found.   Marzetta Board, MD, PhD [facility]  Between 7 am - 7 pm I am available, please contact me via Amion (for emergencies) or Securechat (non urgent messages)  Between 7 pm - 7 am I am not available, please contact night coverage MD/APP via Amion

## 2021-04-27 ENCOUNTER — Inpatient Hospital Stay (HOSPITAL_COMMUNITY): Payer: 59 | Admitting: Certified Registered Nurse Anesthetist

## 2021-04-27 ENCOUNTER — Inpatient Hospital Stay: Payer: Self-pay

## 2021-04-27 ENCOUNTER — Inpatient Hospital Stay (HOSPITAL_COMMUNITY): Payer: 59

## 2021-04-27 ENCOUNTER — Encounter (HOSPITAL_COMMUNITY)
Admission: EM | Disposition: A | Discharge status: Home Health | Payer: Self-pay | Source: Home / Self Care | Attending: Family Medicine

## 2021-04-27 ENCOUNTER — Encounter (HOSPITAL_COMMUNITY): Payer: Self-pay | Admitting: Internal Medicine

## 2021-04-27 DIAGNOSIS — R7881 Bacteremia: Secondary | ICD-10-CM | POA: Diagnosis not present

## 2021-04-27 DIAGNOSIS — I3139 Other pericardial effusion (noninflammatory): Secondary | ICD-10-CM

## 2021-04-27 DIAGNOSIS — I059 Rheumatic mitral valve disease, unspecified: Secondary | ICD-10-CM

## 2021-04-27 DIAGNOSIS — R6520 Severe sepsis without septic shock: Secondary | ICD-10-CM | POA: Diagnosis not present

## 2021-04-27 DIAGNOSIS — I700 Atherosclerosis of aorta: Secondary | ICD-10-CM

## 2021-04-27 DIAGNOSIS — A419 Sepsis, unspecified organism: Secondary | ICD-10-CM | POA: Diagnosis not present

## 2021-04-27 HISTORY — PX: TEE WITHOUT CARDIOVERSION: SHX5443

## 2021-04-27 LAB — GLUCOSE, CAPILLARY
Glucose-Capillary: 98 mg/dL (ref 70–99)
Glucose-Capillary: 85 mg/dL (ref 70–99)
Glucose-Capillary: 90 mg/dL (ref 70–99)
Glucose-Capillary: 90 mg/dL (ref 70–99)
Glucose-Capillary: 102 mg/dL — ABNORMAL HIGH (ref 70–99)
Glucose-Capillary: 77 mg/dL (ref 70–99)

## 2021-04-27 LAB — CBC
HCT: 26 % — ABNORMAL LOW (ref 39.0–52.0)
Hemoglobin: 8.1 g/dL — ABNORMAL LOW (ref 13.0–17.0)
MCH: 27.4 pg (ref 26.0–34.0)
MCHC: 31.2 g/dL (ref 30.0–36.0)
MCV: 87.8 fL (ref 80.0–100.0)
Platelets: 416 10*3/uL — ABNORMAL HIGH (ref 150–400)
RBC: 2.96 MIL/uL — ABNORMAL LOW (ref 4.22–5.81)
RDW: 14.5 % (ref 11.5–15.5)
WBC: 13.2 10*3/uL — ABNORMAL HIGH (ref 4.0–10.5)
nRBC: 0 % (ref 0.0–0.2)

## 2021-04-27 LAB — BASIC METABOLIC PANEL
Anion gap: 6 (ref 5–15)
BUN: 27 mg/dL — ABNORMAL HIGH (ref 6–20)
CO2: 23 mmol/L (ref 22–32)
Calcium: 8.5 mg/dL — ABNORMAL LOW (ref 8.9–10.3)
Chloride: 107 mmol/L (ref 98–111)
Creatinine, Ser: 2.67 mg/dL — ABNORMAL HIGH (ref 0.61–1.24)
GFR, Estimated: 28 mL/min — ABNORMAL LOW (ref >60)
Glucose, Bld: 85 mg/dL (ref 70–99)
Potassium: 4.5 mmol/L (ref 3.5–5.1)
Sodium: 136 mmol/L (ref 135–145)

## 2021-04-27 SURGERY — ECHOCARDIOGRAM, TRANSESOPHAGEAL
Anesthesia: Monitor Anesthesia Care

## 2021-04-27 MED ORDER — SODIUM CHLORIDE 0.9 % IV SOLN: INTRAVENOUS | Status: DC | PRN

## 2021-04-27 MED ORDER — LOPERAMIDE HCL 2 MG PO CAPS
2.0000 mg | ORAL_CAPSULE | Freq: Two times a day (BID) | ORAL | Status: DC | PRN
  Administered 2021-04-27: 2 mg via ORAL
  Filled 2021-04-27: qty 1

## 2021-04-27 MED ORDER — PROPOFOL 500 MG/50ML IV EMUL
INTRAVENOUS | Status: DC | PRN
  Administered 2021-04-27: 125 ug/kg/min via INTRAVENOUS

## 2021-04-27 MED ORDER — PROPOFOL 10 MG/ML IV BOLUS
INTRAVENOUS | Status: DC | PRN
  Administered 2021-04-27: 10 mg via INTRAVENOUS
  Administered 2021-04-27: 15 mg via INTRAVENOUS

## 2021-04-27 MED ORDER — LIDOCAINE 2% (20 MG/ML) 5 ML SYRINGE
INTRAMUSCULAR | Status: DC | PRN
  Administered 2021-04-27: 20 mg via INTRAVENOUS
  Administered 2021-04-27: 60 mg via INTRAVENOUS

## 2021-04-27 NOTE — Anesthesia Preprocedure Evaluation (Signed)
Anesthesia Evaluation  Patient identified by MRN, date of birth, ID band Patient awake    Reviewed: Allergy & Precautions, NPO status , Patient's Chart, lab work & pertinent test results  History of Anesthesia Complications Negative for: history of anesthetic complications  Airway Mallampati: III  TM Distance: >3 FB     Dental  (+) Dental Advisory Given, Teeth Intact,    Pulmonary Current Smoker and Patient abstained from smoking.,    breath sounds clear to auscultation       Cardiovascular hypertension, Pt. on medications (-) angina+ CAD  (-) CHF  Rhythm:Regular  1. Left ventricular ejection fraction, by estimation, is 60 to 65%. The  left ventricle has normal function. The left ventricle has no regional  wall motion abnormalities. Left ventricular diastolic parameters were  normal.  2. Right ventricular systolic function is normal. The right ventricular  size is normal.  3. Trivial effuson persists since 03/31/21 not circumferential. The  pericardial effusion is posterior to the left ventricle.  4. The mitral valve is abnormal. Trivial mitral valve regurgitation. No  evidence of mitral stenosis.  5. The aortic valve was not well visualized. There is mild calcification  of the aortic valve. There is mild thickening of the aortic valve. Aortic  valve regurgitation is not visualized. Aortic valve sclerosis is present,  with no evidence of aortic valve  stenosis.  6. The inferior vena cava is normal in size with greater than 50%  respiratory variability, suggesting right atrial pressure of 3 mmHg.   ? Nuclear stress EF: 55%. ? There was no ST segment deviation noted during stress. ? The study is normal. ? The left ventricular ejection fraction is normal (55-65%). ? This is a low risk study.   Gwyndolyn Kaufman, MD     Neuro/Psych PSYCHIATRIC DISORDERS Depression  Neuromuscular disease    GI/Hepatic negative GI  ROS, Neg liver ROS,   Endo/Other  diabetes, Type 2, Insulin DependentLab Results      Component                Value               Date                      HGBA1C                   6.3 (H)             04/01/2021             Renal/GU Renal InsufficiencyRenal diseaseLab Results      Component                Value               Date                      CREATININE               2.67 (H)            04/27/2021           Lab Results      Component                Value               Date  K                        4.5                 04/27/2021                Musculoskeletal Osteo right foot with bacteremia   Abdominal   Peds  Hematology  (+) Blood dyscrasia, anemia , Lab Results      Component                Value               Date                      WBC                      13.2 (H)            04/27/2021                HGB                      8.1 (L)             04/27/2021                HCT                      26.0 (L)            04/27/2021                MCV                      87.8                04/27/2021                PLT                      416 (H)             04/27/2021              Anesthesia Other Findings   Reproductive/Obstetrics                             Anesthesia Physical Anesthesia Plan  ASA: 3  Anesthesia Plan: MAC   Post-op Pain Management: Minimal or no pain anticipated   Induction: Intravenous  PONV Risk Score and Plan: 0 and Propofol infusion and Treatment may vary due to age or medical condition  Airway Management Planned: Nasal Cannula  Additional Equipment:   Intra-op Plan:   Post-operative Plan:   Informed Consent: I have reviewed the patients History and Physical, chart, labs and discussed the procedure including the risks, benefits and alternatives for the proposed anesthesia with the patient or authorized representative who has indicated his/her understanding and acceptance.      Dental advisory given  Plan Discussed with: CRNA and Anesthesiologist  Anesthesia Plan Comments:         Anesthesia Quick Evaluation

## 2021-04-27 NOTE — Progress Notes (Signed)
[name] for Infectious Disease   Reason for visit: Follow up on bacteremia  Interval History: TEE confirms small mv vegetation with tte, which is a new finding compared to previous tte. Patient doing well no f/c. Left foot incision intact but slight maceration per his podiatry team. No n/v/diarrhea/rash  Physical Exam: Constitutional:  Vitals:   04/27/21 1025 04/27/21 1032  BP: 124/70 (!) 141/96  Pulse: 72 73  Resp: 13 14  Temp: 98 F (36.7 C)   SpO2: 97% 100%   patient appears in NAD  General/constitutional: no distress, pleasant HEENT: Normocephalic, PER, Conj Clear, EOMI, Oropharynx clear Neck supple CV: rrr no mrg Lungs: clear to auscultation, normal respiratory effort Abd: Soft, Nontender Ext: no edema Skin: No Rash Neuro: nonfocal MSK: s/p chopart amputation right LE; the incision is with staples and intact; serosanguinous oozing; no redness/tenderness   Labs: Lab Results  Component Value Date   WBC 13.2 (H) 04/27/2021   HGB 8.1 (L) 04/27/2021   HCT 26.0 (L) 04/27/2021   MCV 87.8 04/27/2021   PLT 416 (H) 11/91/4782   Last metabolic panel Lab Results  Component Value Date   GLUCOSE 85 04/27/2021   NA 136 04/27/2021   K 4.5 04/27/2021   CL 107 04/27/2021   CO2 23 04/27/2021   BUN 27 (H) 04/27/2021   CREATININE 2.67 (H) 04/27/2021   GFRNONAA 28 (L) 04/27/2021   CALCIUM 8.5 (L) 04/27/2021   PHOS 3.7 04/21/2021   PROT 6.9 04/26/2021   ALBUMIN 2.4 (L) 04/26/2021   LABGLOB 4.0 (H) 07/04/2020   AGRATIO 0.8 07/04/2020   BILITOT 0.3 04/26/2021   ALKPHOS 69 04/26/2021   AST 23 04/26/2021   ALT 6 04/26/2021   ANIONGAP 6 04/27/2021    Imaging: 1/30 tee Reviewed with Dr Marlou Porch; small density on posterior MV leaflet noted as above; cannot R/O small vegetation; would treat presumptively for possible SBE; repeat TEE 8-12 weeks.  Impression: mssa bacteremia with 4/4 blood cultures positive for MSSA.   Osteomyelitis right foot with surgical  resection.  1/24 bcx negative; 1/20 bcx mssa. Wound swab cx mssa only; no surgical tissue cx   Plan: 1.  Tee positive for MV small vegetation. 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 6 week abx from 1/24 until  3/07  2.  Will order PICC 3.  See follow up information and OPAT information below 4.  Discuss with team  OPAT Orders Discharge antibiotics to be given via PICC line Discharge antibiotics: cefazolin 2 gram q8hours Duration: 6 wks End Date: 06/02/2021  [facility] Care Per Protocol:  Home health RN for IV administration and teaching; PICC line care and labs.    Labs weekly while on IV antibiotics: _x_ CBC with differential __ BMP _x_ CMP _x_ CRP __ ESR __ Vancomycin trough __ CK  _x_ Please pull PIC at completion of IV antibiotics __ Please leave PIC in place until doctor has seen patient or been notified  Fax weekly labs to [phone]  Clinic Follow Up Appt: Dr [name] 3/2 @ 1045  @  [facility] [place], [place] [employer] [place], [place] [address] Phone: [phone]

## 2021-04-27 NOTE — Care Management (Signed)
°  °  Durable Medical Equipment  (From admission, onward)           Start     Ordered   04/24/21 2013  For home use only DME wheelchair cushion (seat and back)  Once       Comments: With leg rest   04/24/21 2012

## 2021-04-27 NOTE — TOC Initial Note (Signed)
Transition of Care [facility]) - Initial/Assessment Note   Patient Details  Name: [name] MRN: [id number] Date of Birth: [birth year]  Transition of Care [facility]) CM/SW Contact:    Sherie Don, LCSW Phone Number: 04/27/2021, 3:53 PM  Clinical Narrative: Patient will need to discharge home with IV antibiotics and will need HHRN/PT. CSW made IV antibiotics referral to [facility] with [employer]. Pam in the process of verifying patient's [facility] Medicare so [place] can be set up. Patient will also need a wheelchair for in-house use and is agreeable to referral to [employer]. CSW made referral for wheelchair to [facility] with [employer]. [employer] to deliver wheelchair to room. TOC to follow.  Expected Discharge Plan: [place] Barriers to Discharge: Continued Medical Work up  Patient Goals and CMS Choice Patient states their goals for this hospitalization and ongoing recovery are:: Return home with [facility] CMS Medicare.gov Compare Post Acute Care list provided to:: Patient Choice offered to / list presented to : Patient  Expected Discharge Plan and Services Expected Discharge Plan: [place] In-house Referral: Clinical Social Work Post Acute Care Choice: Home Health, Durable Medical Equipment Living arrangements for the past 2 months: [place]             DME Arranged: Gilford Rile wide DME Agency: [employer] Date DME Agency Contacted: 04/27/21 Representative spoke with at DME Agency: Andee Poles HH Arranged: RN, PT, IV Antibiotics HH Agency: [employer] Date [place]: 04/27/21 Representative spoke with at [place]: Osborne Casco)  Prior Living Arrangements/Services Living arrangements for the past 2 months: [place] Patient language and need for interpreter reviewed:: Yes Do you feel safe going back to the place where you live?: Yes      Need for Family Participation in Patient Care: No (Comment) Care giver support system in place?: Yes (comment) Criminal  Activity/Legal Involvement Pertinent to Current Situation/Hospitalization: No - Comment as needed  Activities of Daily Living Home Assistive Devices/Equipment: Eyeglasses, Cane (specify quad or straight), CBG Meter (Libre) ADL Screening (condition at time of admission) Patient's cognitive ability adequate to safely complete daily activities?: Yes Is the patient deaf or have difficulty hearing?: Yes (has tubes in both ears) Does the patient have difficulty seeing, even when wearing glasses/contacts?: No Does the patient have difficulty concentrating, remembering, or making decisions?: No Patient able to express need for assistance with ADLs?: Yes Does the patient have difficulty dressing or bathing?: Yes Independently performs ADLs?: No Communication: Independent Dressing (OT): Independent Grooming: Independent Feeding: Independent Bathing: Independent Toileting: Needs assistance Is this a change from baseline?: Pre-admission baseline In/Out Bed: Needs assistance Is this a change from baseline?: Pre-admission baseline Walks in Home: Needs assistance Is this a change from baseline?: Pre-admission baseline Does the patient have difficulty walking or climbing stairs?: Yes Weakness of Legs: Right (also has a callus on left foot) Weakness of Arms/Hands: None  Permission Sought/Granted Permission sought to share information with : Other (comment) Permission granted to share information with : Yes, Verbal Permission Granted Permission granted to share info w AGENCY: [employer], [place] agencies  Emotional Assessment Appearance:: Appears stated age Attitude/Demeanor/Rapport: Engaged Affect (typically observed): Accepting Orientation: : Oriented to Self, Oriented to Place, Oriented to  Time, Oriented to Situation Alcohol / Substance Use: Tobacco Use Psych Involvement: No (comment)  Admission diagnosis:  Right foot infection [L08.9] Patient Active Problem List   Diagnosis Date Noted    Endocarditis of mitral valve    Severe sepsis ([place]) 04/21/2021  MSSA bacteremia    Type 2 diabetes mellitus with hyperglycemia ([name]) 04/17/2021   Acute kidney injury superimposed on CKD ([place]) 04/17/2021   Mixed hyperlipidemia 04/17/2021   Eustachian tube dysfunction, bilateral 03/18/2021   Ulcer of right foot with necrosis of bone ([place]) 07/22/2020   Anemia in chronic kidney disease (CKD) 07/05/2020   Wound infection 03/25/2020   Gas gangrene of foot ([place])    Type 2 diabetes mellitus with diabetic polyneuropathy, with long-term current use of insulin ([place]) 07/16/2019   Type 2 diabetes mellitus with stage 3a chronic kidney disease, with long-term current use of insulin ([place]) 07/16/2019   Diabetic foot ulcer ([place]) 06/29/2019   Stage 3b chronic kidney disease (CKD) ([place]) 01/24/2019   Obesity 01/24/2019   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[place]) 11/13/2018   Diabetic foot ([place]) 10/17/2018   Non-pressure chronic ulcer of other part of right foot limited to breakdown of skin ([place]) 09/11/2018   Type 2 diabetes mellitus with proliferative retinopathy of both eyes, without long-term current use of insulin ([place]) 07/14/2018   Diabetic infection of left foot ([place]) 07/14/2018   Wound cellulitis 05/22/2018   Osteomyelitis of foot, acute ([place]) 05/21/2018   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 ([place])    Diabetic ulcer of left midfoot associated with diabetes mellitus due to underlying condition, with fat layer exposed ([place]) 08/26/2017   B12 deficiency 06/15/2017   Vitamin D deficiency 06/03/2017   Iron deficiency anemia 06/01/2017   Diabetic foot infection ([place])    Osteomyelitis ([place]) 05/25/2017   Vitreous floaters of right eye 09/15/2016   Non compliance w medication regimen 09/15/2016   Depression 09/01/2016   Foot ulcer due to secondary DM ([place]) 08/20/2016   Hidradenitis suppurativa 10/13/2015   Peripheral polyneuropathy 07/07/2015   Coronary atherosclerosis of native coronary artery  08/17/2013   Diabetic retinopathy ([place]) 02/02/2013   Diabetic neuropathy ([name]) 12/01/2012   Essential hypertension 08/03/2012   Tobacco use disorder 08/03/2012   Erectile dysfunction 08/03/2012   Diabetes mellitus with neurological manifestations, uncontrolled 05/26/2006   PCP:  Camillia Herter, NP Pharmacy:   [facility] [place] [address] [place] ([place] [address] Phone: [phone] Fax: [place] [address] [place]  [address]   [facility] - [place], [address] [place] [address] Phone: [phone] Fax: [phone]  Readmission Risk Interventions No flowsheet data found.

## 2021-04-27 NOTE — Progress Notes (Signed)
PT Cancellation Note  Patient Details Name: [name] MRN: [id number] DOB: 19-Oct-1970   Cancelled Treatment:    Reason Eval/Treat Not Completed: Patient at procedure or test/unavailable   Kati L Payson 04/27/2021, 8:54 AM Arlyce Dice, DPT Acute Rehabilitation Services Pager: [phone] Office: [phone]

## 2021-04-27 NOTE — Progress Notes (Signed)
°  Echocardiogram Echocardiogram Transesophageal has been performed.  Bobbye Charleston 04/27/2021, 9:41 AM

## 2021-04-27 NOTE — Progress Notes (Signed)
PHARMACY CONSULT NOTE FOR:  OUTPATIENT  PARENTERAL ANTIBIOTIC THERAPY (OPAT)  Indication: MSSA IE Regimen: Cefazolin 2g IV every 8 hours End date: 06/02/21  IV antibiotic discharge orders are pended. To discharging provider:  please sign these orders via discharge navigator,  Select New Orders & click on the button choice - Manage This Unsigned Work.     Thank you for allowing pharmacy to be a part of this patients care.  Alycia Rossetti, PharmD, BCPS Infectious Diseases Clinical Pharmacist 04/27/2021 12:24 PM   **Pharmacist phone directory can now be found on [email or web address] ([email or web address]).  Listed under [place].

## 2021-04-27 NOTE — Anesthesia Procedure Notes (Signed)
Procedure Name: MAC Date/Time: 04/27/2021 9:15 AM Performed by: Janene Harvey, CRNA Pre-anesthesia Checklist: Patient identified, Emergency Drugs available, Suction available and Patient being monitored Oxygen Delivery Method: Simple face mask Induction Type: IV induction Placement Confirmation: positive ETCO2 Dental Injury: Teeth and Oropharynx as per pre-operative assessment

## 2021-04-27 NOTE — Progress Notes (Signed)
Pt has an order for PICC placement. Unable to get consent due to patient has anesthesia less than 24 hours. Pt stated "I am only the one who signs my consent". Aware that PICC cannot be placed tonight due to consent issues.RN notified.

## 2021-04-27 NOTE — Progress Notes (Signed)
Wound vac turned off when came into pacu, was reported had been off since last night. Attempted to turn on to see errors and wouldn't apply any negative pressure with alarms. Paged WOCN at [facility] and reported to receiving day nurse that it had been off and needed to be re done. RN stated she had gotten report that patient had been messing with machine.

## 2021-04-27 NOTE — Progress Notes (Signed)
Subjective: [name] is a 51 y.o. male patient seen at bedside, resting comfortably in no acute distress s/p right Chopart amputation performed by Dr. March Rummage on 04/22/2021.  Patient also was seen by cardiovascular lab and had TEE performed.  Patient denies pain at surgical site or any constitutional symptoms at this time.  Patient reports that he is ready to go home.  Secondarily states that he has been having issues with his wound VAC and the machine has been constantly beeping and messing up every since yesterday.  Patient denies any other concerns at this time.  Patient Active Problem List   Diagnosis Date Noted   Severe sepsis ([place]) 04/21/2021   MSSA bacteremia    Type 2 diabetes mellitus with hyperglycemia ([place]) 04/17/2021   Acute kidney injury superimposed on CKD ([place]) 04/17/2021   Mixed hyperlipidemia 04/17/2021   Eustachian tube dysfunction, bilateral 03/18/2021   Ulcer of right foot with necrosis of bone ([place]) 07/22/2020   Anemia in chronic kidney disease (CKD) 07/05/2020   Wound infection 03/25/2020   Gas gangrene of foot ([place])    Type 2 diabetes mellitus with diabetic polyneuropathy, with long-term current use of insulin ([place]) 07/16/2019   Type 2 diabetes mellitus with stage 3a chronic kidney disease, with long-term current use of insulin ([place]) 07/16/2019   Diabetic foot ulcer ([place]) 06/29/2019   Stage 3b chronic kidney disease (CKD) ([place]) 01/24/2019   Obesity 01/24/2019   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[place]) 11/13/2018   Diabetic foot ([place]) 10/17/2018   Non-pressure chronic ulcer of other part of right foot limited to breakdown of skin ([place]) 09/11/2018   Type 2 diabetes mellitus with proliferative retinopathy of both eyes, without long-term current use of insulin ([place]) 07/14/2018   Diabetic infection of left foot ([place]) 07/14/2018   Wound cellulitis 05/22/2018   Osteomyelitis of foot, acute ([place]) 05/21/2018   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 ([place])     Diabetic ulcer of left midfoot associated with diabetes mellitus due to underlying condition, with fat layer exposed ([place]) 08/26/2017   B12 deficiency 06/15/2017   Vitamin D deficiency 06/03/2017   Iron deficiency anemia 06/01/2017   Diabetic foot infection ([place])    Osteomyelitis ([place]) 05/25/2017   Vitreous floaters of right eye 09/15/2016   Non compliance w medication regimen 09/15/2016   Depression 09/01/2016   Foot ulcer due to secondary DM ([place]) 08/20/2016   Hidradenitis suppurativa 10/13/2015   Peripheral polyneuropathy 07/07/2015   Coronary atherosclerosis of native coronary artery 08/17/2013   Diabetic retinopathy ([place]) 02/02/2013   Diabetic neuropathy ([place]) 12/01/2012   Essential hypertension 08/03/2012   Tobacco use disorder 08/03/2012   Erectile dysfunction 08/03/2012   Diabetes mellitus with neurological manifestations, uncontrolled 05/26/2006     Current Facility-Administered Medications:    acetaminophen (TYLENOL) tablet 1,000 mg, 1,000 mg, Oral, TID, Danford, Suann Larry, MD, 1,000 mg at 04/26/21 2129   amLODipine (NORVASC) tablet 10 mg, 10 mg, Oral, Daily, [name], DPM, 10 mg at 04/26/21 0932   ascorbic acid (VITAMIN C) tablet 500 mg, 500 mg, Oral, Daily, [name], DPM, 500 mg at 04/26/21 8099   aspirin EC tablet 81 mg, 81 mg, Oral, q morning, [name], DPM, 81 mg at 04/26/21 0932   atorvastatin (LIPITOR) tablet 20 mg, 20 mg, Oral, Daily, [name], DPM, 20 mg at 04/26/21 0933   carvedilol (COREG) tablet 25 mg, 25 mg, Oral, BID, [name], DPM, 25 mg at 04/26/21 2130  ceFAZolin (ANCEF) IVPB 2g/100 mL premix, 2 g, Intravenous, Q8H, Lenis Noon, RPH, Last Rate: 200 mL/hr at 04/27/21 0504, 2 g at 04/27/21 0504   cloNIDine (CATAPRES) tablet 0.1 mg, 0.1 mg, Oral, BID, [name], DPM, 0.1 mg at 04/26/21 2130   ferrous sulfate tablet 325 mg, 325 mg, Oral, Q breakfast, [name], DPM, 325 mg at 04/26/21 9417    gabapentin (NEURONTIN) capsule 300 mg, 300 mg, Oral, BID, Florencia Reasons, MD, 300 mg at 04/26/21 2129   insulin aspart (novoLOG) injection 0-9 Units, 0-9 Units, Subcutaneous, TID WC, Danford, Suann Larry, MD, 2 Units at 04/25/21 1152   multivitamin with minerals tablet 1 tablet, 1 tablet, Oral, Daily, [name], DPM, 1 tablet at 04/26/21 0933   ondansetron (ZOFRAN) injection 4 mg, 4 mg, Intravenous, Q6H, [name], DPM, 4 mg at 04/26/21 1010   oxyCODONE (Oxy IR/ROXICODONE) immediate release tablet 5 mg, 5 mg, Oral, Q4H PRN, Danford, Suann Larry, MD, 5 mg at 04/26/21 2129   polyethylene glycol (MIRALAX / GLYCOLAX) packet 17 g, 17 g, Oral, Daily, Florencia Reasons, MD, 17 g at 04/26/21 0935   protein supplement (ENSURE MAX) liquid, 11 oz, Oral, BID, [name], DPM, 11 oz at 04/26/21 0935   senna-docusate (Senokot-S) tablet 1 tablet, 1 tablet, Oral, BID, Florencia Reasons, MD, 1 tablet at 04/26/21 2129   tamsulosin (FLOMAX) capsule 0.4 mg, 0.4 mg, Oral, QPC supper, Florencia Reasons, MD, 0.4 mg at 04/26/21 1819  Allergies  Allergen Reactions   Bee Venom Swelling    SWELLING REACTION UNSPECIFIED    Penicillins Hives and Other (See Comments)    Full body hives as a child with no shortness of breath or swelling  **Can tolerate cephalosporins**   Clonidine Derivatives     Pt states "It is making me dizzy"     Objective: Today's Vitals   04/27/21 0955 04/27/21 1010 04/27/21 1025 04/27/21 1032  BP: 129/77 134/75 124/70 (!) 141/96  Pulse: 81 73 72 73  Resp: 11 13 13 14   Temp:   98 F (36.7 C)   TempSrc:      SpO2: 99% 100% 97% 100%  Weight:      Height:      PainSc:  0-No pain 0-No pain     General: No acute distress  Right Lower extremity: Dressing to Right foot clean, dry, intact, upon removing of the vac sponge there is significant maceration at the amputation stump site.  Sutures and staples are intact.  No significant erythema, decreased edema, minimal bloody drainage.  No malodor.  No  other acute signs of infection. No calf pain. Range of motion excluding surgical site within normal limits with no pain or crepitation.      Assessment and Plan:  Problem List Items Addressed This Visit       Other   * (Principal) Severe sepsis ([place])    Presented with tachycardia, leukocytosis, and renal failure creatinine >3.  Admitted on vancomycin, and found to have wound infection of the right forefoot, cellulitis, and MSSA bacteremia.  S/p metatarsal amputation by Dr. March Rummage 1/22 s/p debridement by Dr. March Rummage 1/25  ID consulted, narrowed to cefazolin  - Continue cefazolin - TEE Monday  After TEE: - if no vegetation, ID will discharge on oral antibiotics to complete short course - if vegetation noted, will place PICC and d/c with 6 weeks IV Abx      RESOLVED: Right foot infection   Relevant Medications  ceFAZolin (ANCEF) IVPB 2g/100 mL premix    -Patient seen and evaluated at bedside -Chart reviewed -Dressing change preformed; applied Betadine wet to dry dressing to right foot -Advised patient to make sure to keep dressing clean, dry, and intact to Right foot only change if there is strikethrough however patient will see Dr. March Rummage in office on tomorrow at 622 for application of Prevena incisional VAC -Continue with antibiotics as directed by infectious disease; WBC downtrending  -Advised patient to be compliant with nonweightbearing to right foot with use of rolling walker and cam boot protection -Continue with rest and elevation to assist with pain and edema control  -Podiatry is stable for discharge home today  -Anticipated discharge plan of care: To home with antibiotics  per infectious disease and follow-up in office with Dr. March Rummage on tomorrow for wound check/application of Prevena wound VAC.  My office will contact patient to confirm appointment for tomorrow.  Landis Martins, DPM [facility] [phone] office [phone] cell Available via secure  chat

## 2021-04-27 NOTE — Progress Notes (Addendum)
° ° °  Transesophageal Echocardiogram Note  [name] [id number] 1970-11-10  Procedure: Transesophageal Echocardiogram Indications: Bacteremia  Procedure Details Consent: Obtained Time Out: Verified patient identification, verified procedure, site/side was marked, verified correct patient position, special equipment/implants available, Radiology Safety Procedures followed,  medications/allergies/relevent history reviewed, required imaging and test results available.  Performed  Medications:  Pt sedated by anesthesia with lidocaine 80 mg and diprovan 202 mg IV total.  Normal LV function; small pericardial effusion; probable nodular thickening of posterior MV leaflet and small ruptured chord; no clear vegetations; trace MR and TR.   Complications: No apparent complications Patient did tolerate procedure well.  Kirk Ruths, MD  Reviewed with Dr Marlou Porch; small density on posterior MV leaflet noted as above; cannot R/O small vegetation; would treat presumptively for possible SBE; repeat TEE 8-12 weeks. Kirk Ruths

## 2021-04-27 NOTE — Progress Notes (Signed)
PROGRESS NOTE    [name]  OZD:[id number] DOB: Sep 15, 1970 DOA: 04/17/2021 PCP: Camillia Herter, NP    Brief Narrative: This 51 year old male with PMH significant of DM2, HTN, CKD 3B, recurrent right foot wound seen by podiatry as an outpatient comes into the hospital due to worsening of his wound.  He was sent here from his podiatrist office.  Blood cultures have returned positive for MSSA and ID was consulted.  He was taken to the OR on 1/25 and underwent final debridement.  Given MSSA bacteremia  TEE was done showed small vegetation om MV, ID recommended 6 weeks of IV antibiotics.   Assessment & Plan:   Principal Problem:   Severe sepsis ([place]) Active Problems:   Essential hypertension   Diabetic neuropathy ([name])   Stage 3b chronic kidney disease (CKD) ([name])   Obesity   Anemia in chronic kidney disease (CKD)   Type 2 diabetes mellitus with hyperglycemia ([name])   Acute kidney injury superimposed on CKD ([place])   Mixed hyperlipidemia   MSSA bacteremia   Endocarditis of mitral valve  Severe sepsis secondary to MSSA bacteremia: Likely source is from right foot. Podiatry and ID following.   He met sepsis criteria on admission with tachycardia, increased WBC as well as acute kidney injury.   He was initially placed on vancomycin, now transitioned to cefazolin.  Sepsis physiology resolved.   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   He underwent TEE on 04/27/2021.  He was found to have a small vegetation on mitral valve.   ID recommended 6 weeks of IV cefazolin.  Patient is going to get  PICC line tomorrow.    Currently has a wound VAC in place   Acute kidney injury on chronic kidney disease stage IIIb: Baseline serum creatinine 2.4-2.6.  Initially improved but increased again in the 3.0 range. Continue IV hydration.  Serum creatinine improved to 2.7. Continue to hold further fluids,  Encourage p.o. intake.  Essential  hypertension: Blood pressure has improved.   Continue amlodipine, Coreg.  Hyperlipidemia: Continue statin  Hyponatremia: Mild, continue to monitor.  Anemia due to chronic kidney disease.: H&H is stable.  Obesity / BMI 33: Continue diet and exercise.  BPH: Continue Flomax  Type 2 diabetes: Neuropathy:  Continue sliding scale, gabapentin     DVT prophylaxis: SCDs Code Status: Full code Family Communication:  No Family at bedside Disposition Plan:   Status is: Inpatient  Remains inpatient appropriate because: Admitted for MSSA bacteremia underwent TEE found to have vegetation needing PICC line and IV antibiotic for 6 weeks.  Consultants:  Cardiology Infectious disease Podiatry  Procedures: Metatarsal amputation. Antimicrobials:  Anti-infectives (From admission, onward)    Start     Dose/Rate Route Frequency Ordered Stop   04/26/21 2000  ceFAZolin (ANCEF) IVPB 2g/100 mL premix        2 g 200 mL/hr over 30 Minutes Intravenous Every 8 hours 04/26/21 1344     04/24/21 1000  ceFAZolin (ANCEF) IVPB 2g/100 mL premix  Status:  Discontinued        2 g 200 mL/hr over 30 Minutes Intravenous Every 12 hours 04/24/21 0727 04/26/21 1344   04/22/21 1350  vancomycin (VANCOCIN) powder  Status:  Discontinued          As needed 04/22/21 1350 04/22/21 1548   04/19/21 2200  vancomycin (VANCOREADY) IVPB 1500 mg/300 mL  Status:  Discontinued        1,500  mg 150 mL/hr over 120 Minutes Intravenous Every 48 hours 04/17/21 2301 04/18/21 1011   04/19/21 2200  vancomycin (VANCOCIN) IVPB 1000 mg/200 mL premix  Status:  Discontinued        1,000 mg 200 mL/hr over 60 Minutes Intravenous Every 48 hours 04/18/21 1011 04/18/21 1830   04/19/21 1615  levofloxacin (LEVAQUIN) tablet 750 mg  Status:  Discontinued        750 mg Oral Every 48 hours 04/19/21 1526 04/20/21 1031   04/19/21 1337  ceFAZolin (ANCEF) 2-4 GM/100ML-% IVPB       Note to Pharmacy: Dara Lords M: cabinet override      04/19/21  1337 04/20/21 0144   04/19/21 0800  ceFAZolin (ANCEF) IVPB 2g/100 mL premix  Status:  Discontinued        2 g 200 mL/hr over 30 Minutes Intravenous Every 8 hours 04/19/21 0719 04/24/21 0727   04/18/21 2000  ceFAZolin (ANCEF) IVPB 2g/100 mL premix  Status:  Discontinued        2 g 200 mL/hr over 30 Minutes Intravenous Every 12 hours 04/18/21 1830 04/19/21 0719   04/17/21 2345  vancomycin (VANCOREADY) IVPB 2000 mg/400 mL        2,000 mg 200 mL/hr over 120 Minutes Intravenous  Once 04/17/21 2245 04/18/21 0107        Subjective: Patient was seen and examined at bedside.  Overnight events noted.   Patient reports feeling better, he reports having pain in the right foot.  Objective: Vitals:   04/27/21 1010 04/27/21 1025 04/27/21 1032 04/27/21 1356  BP: 134/75 124/70 (!) 141/96 (!) 123/91  Pulse: 73 72 73 92  Resp: _0 Temp:  98 F (36.7 C)  98 F (36.7 C)  TempSrc:      SpO2: 100% 97% 100% 100%  Weight:      Height:        Intake/Output Summary (Last 24 hours) at 04/27/2021 1542 Last data filed at 04/27/2021 1125 Gross per 24 hour  Intake 639.25 ml  Output 1500 ml  Net -860.75 ml   Filed Weights   04/17/21 1643  Weight: 88.9 kg    Examination:  General exam: Appears comfortable, not in any acute distress.  Deconditioned Respiratory system: Clear to auscultation bilaterally. Respiratory effort normal.  RR 15 Cardiovascular system: S1 & S2 heard, regular rate and rhythm, no murmur. Gastrointestinal system: Abdomen is soft,  non tender, nondistended, BS Central nervous system: Alert and oriented. No focal neurological deficits. Extremities: Dressing to the right foot clean intact with wound VAC connected. Skin: No rashes, lesions or ulcers Psychiatry: Judgement and insight appear normal. Mood & affect appropriate.     Data Reviewed: I have personally reviewed following labs and imaging studies  CBC: Recent Labs  Lab 04/22/21 0324 04/24/21 0355  04/26/21 0440 04/27/21 0417  WBC 14.4* 16.8*   16.9* 13.2* 13.2*  NEUTROABS  --  11.4*  --   --   HGB 8.2* 8.0*   7.9* 8.1* 8.1*  HCT 27.3* 25.5*   25.7* 25.8* 26.0*  MCV 88.1 88.2   87.4 87.5 87.8  PLT 343 393   389 380 465*   Basic Metabolic Panel: Recent Labs  Lab 04/21/21 0332 04/22/21 0324 04/24/21 0321 04/25/21 0450 04/26/21 0440 04/27/21 0417  NA 134* 135 132* 135 136 136  K 4.0 4.4 4.5 4.3 4.3 4.5  CL 104 107 102 105 110 107  CO2 22 21* 22 22 21* 23  GLUCOSE  125* 135* 118* 111* 96 85  BUN 21* 29* 33* 36* 30* 27*  CREATININE 2.66* 2.68* 3.43* 3.37* 2.74* 2.67*  CALCIUM 8.1* 8.3* 8.1* 8.5* 8.0* 8.5*  PHOS 3.7  --   --   --   --   --    GFR: Estimated Creatinine Clearance: 32.9 mL/min (A) (by C-G formula based on SCr of 2.67 mg/dL (H)). Liver Function Tests: Recent Labs  Lab 04/21/21 0332 04/26/21 0440  AST  --  23  ALT  --  6  ALKPHOS  --  69  BILITOT  --  0.3  PROT  --  6.9  ALBUMIN 2.4* 2.4*   No results for input(s): LIPASE, AMYLASE in the last 168 hours. No results for input(s): AMMONIA in the last 168 hours. Coagulation Profile: No results for input(s): INR, PROTIME in the last 168 hours. Cardiac Enzymes: No results for input(s): CKTOTAL, CKMB, CKMBINDEX, TROPONINI in the last 168 hours. BNP (last 3 results) No results for input(s): PROBNP in the last 8760 hours. HbA1C: No results for input(s): HGBA1C in the last 72 hours. CBG: Recent Labs  Lab 04/26/21 2129 04/27/21 0736 04/27/21 0901 04/27/21 0945 04/27/21 1142  GLUCAP 119* 98 85 90 90   Lipid Profile: No results for input(s): CHOL, HDL, LDLCALC, TRIG, CHOLHDL, LDLDIRECT in the last 72 hours. Thyroid Function Tests: No results for input(s): TSH, T4TOTAL, FREET4, T3FREE, THYROIDAB in the last 72 hours. Anemia Panel: No results for input(s): VITAMINB12, FOLATE, FERRITIN, TIBC, IRON, RETICCTPCT in the last 72 hours. Sepsis Labs: No results for input(s): PROCALCITON, LATICACIDVEN in the  last 168 hours.  Recent Results (from the past 240 hour(s))  Resp Panel by RT-PCR (Flu A&B, Covid) Nasopharyngeal Swab     Status: None   Collection Time: 04/17/21  5:17 PM   Specimen: Nasopharyngeal Swab; Nasopharyngeal(NP) swabs in vial transport medium  Result Value Ref Range Status   SARS Coronavirus 2 by RT PCR NEGATIVE NEGATIVE Final    Comment: (NOTE) SARS-CoV-2 target nucleic acids are NOT DETECTED.  The SARS-CoV-2 RNA is generally detectable in upper respiratory specimens during the acute phase of infection. The lowest concentration of SARS-CoV-2 viral copies this assay can detect is 138 copies/mL. A negative result does not preclude SARS-Cov-2 infection and should not be used as the sole basis for treatment or other patient management decisions. A negative result may occur with  improper specimen collection/handling, submission of specimen other than nasopharyngeal swab, presence of viral mutation(s) within the areas targeted by this assay, and inadequate number of viral copies(<138 copies/mL). A negative result must be combined with clinical observations, patient history, and epidemiological information. The expected result is Negative.  Fact Sheet for Patients:  EntrepreneurPulse.com.au  Fact Sheet for Healthcare Providers:  IncredibleEmployment.be  This test is no t yet approved or cleared by the [place] FDA and  has been authorized for detection and/or diagnosis of SARS-CoV-2 by FDA under an Emergency Use Authorization (EUA). This EUA will remain  in effect (meaning this test can be used) for the duration of the COVID-19 declaration under Section 564(b)(1) of the Act, 21 U.S.C.section 360bbb-3(b)(1), unless the authorization is terminated  or revoked sooner.       Influenza A by PCR NEGATIVE NEGATIVE Final   Influenza B by PCR NEGATIVE NEGATIVE Final    Comment: (NOTE) The Xpert Xpress SARS-CoV-2/FLU/RSV plus assay is  intended as an aid in the diagnosis of influenza from Nasopharyngeal swab specimens and should not be used as a  sole basis for treatment. Nasal washings and aspirates are unacceptable for Xpert Xpress SARS-CoV-2/FLU/RSV testing.  Fact Sheet for Patients: EntrepreneurPulse.com.au  Fact Sheet for Healthcare Providers: IncredibleEmployment.be  This test is not yet approved or cleared by the [place] FDA and has been authorized for detection and/or diagnosis of SARS-CoV-2 by FDA under an Emergency Use Authorization (EUA). This EUA will remain in effect (meaning this test can be used) for the duration of the COVID-19 declaration under Section 564(b)(1) of the Act, 21 U.S.C. section 360bbb-3(b)(1), unless the authorization is terminated or revoked.  Performed at [facility], [place] [address], [place], [place] [address]   Culture, blood (routine x 2)     Status: Abnormal   Collection Time: 04/17/21  5:17 PM   Specimen: Right Antecubital; Blood  Result Value Ref Range Status   Specimen Description   Final    RIGHT ANTECUBITAL Performed at [facility] [address] [place], [place] [address]    Special Requests   Final    BOTTLES DRAWN AEROBIC AND ANAEROBIC Blood Culture results may not be optimal due to an inadequate volume of blood received in culture bottles Performed at [facility] [address] [place], [place] [address]    Culture  Setup Time   Final    GRAM POSITIVE COCCI IN CLUSTERS IN BOTH AEROBIC AND ANAEROBIC BOTTLES CRITICAL RESULT CALLED TO, READ BACK BY AND VERIFIED WITH: E,WILLIAMSON PHARMD_0  04/18/21 EB Performed at [facility], [place] [address] [place], [place] [address]    Culture STAPHYLOCOCCUS AUREUS (A)  Final   Report Status 04/20/2021 FINAL  Final   Organism ID, Bacteria STAPHYLOCOCCUS AUREUS  Final      Susceptibility   Staphylococcus aureus -  MIC*    CIPROFLOXACIN >=8 RESISTANT Resistant     ERYTHROMYCIN >=8 RESISTANT Resistant     GENTAMICIN <=0.5 SENSITIVE Sensitive     OXACILLIN 1 SENSITIVE Sensitive     TETRACYCLINE >=16 RESISTANT Resistant     VANCOMYCIN 1 SENSITIVE Sensitive     TRIMETH/SULFA <=10 SENSITIVE Sensitive     CLINDAMYCIN RESISTANT Resistant     RIFAMPIN <=0.5 SENSITIVE Sensitive     Inducible Clindamycin POSITIVE Resistant     * STAPHYLOCOCCUS AUREUS  Blood Culture ID Panel (Reflexed)     Status: Abnormal   Collection Time: 04/17/21  5:17 PM  Result Value Ref Range Status   Enterococcus faecalis NOT DETECTED NOT DETECTED Final   Enterococcus Faecium NOT DETECTED NOT DETECTED Final   Listeria monocytogenes NOT DETECTED NOT DETECTED Final   Staphylococcus species DETECTED (A) NOT DETECTED Final    Comment: CRITICAL RESULT CALLED TO, READ BACK BY AND VERIFIED WITH: E,WILLIAMSON PHARMD_1  04/18/21 EB    Staphylococcus aureus (BCID) DETECTED (A) NOT DETECTED Final    Comment: CRITICAL RESULT CALLED TO, READ BACK BY AND VERIFIED WITH: E,WILLIAMSON PHARMD_2  04/18/21 EB    Staphylococcus epidermidis NOT DETECTED NOT DETECTED Final   Staphylococcus lugdunensis NOT DETECTED NOT DETECTED Final   Streptococcus species NOT DETECTED NOT DETECTED Final   Streptococcus agalactiae NOT DETECTED NOT DETECTED Final   Streptococcus pneumoniae NOT DETECTED NOT DETECTED Final   Streptococcus pyogenes NOT DETECTED NOT DETECTED Final   A.calcoaceticus-baumannii NOT DETECTED NOT DETECTED Final   Bacteroides fragilis NOT DETECTED NOT DETECTED Final   Enterobacterales NOT DETECTED NOT DETECTED Final   Enterobacter cloacae complex NOT DETECTED NOT DETECTED Final   Escherichia coli NOT DETECTED NOT DETECTED Final  Klebsiella aerogenes NOT DETECTED NOT DETECTED Final   Klebsiella oxytoca NOT DETECTED NOT DETECTED Final   Klebsiella pneumoniae NOT DETECTED NOT DETECTED Final   Proteus species NOT DETECTED NOT DETECTED Final    Salmonella species NOT DETECTED NOT DETECTED Final   Serratia marcescens NOT DETECTED NOT DETECTED Final   Haemophilus influenzae NOT DETECTED NOT DETECTED Final   Neisseria meningitidis NOT DETECTED NOT DETECTED Final   Pseudomonas aeruginosa NOT DETECTED NOT DETECTED Final   Stenotrophomonas maltophilia NOT DETECTED NOT DETECTED Final   Candida albicans NOT DETECTED NOT DETECTED Final   Candida auris NOT DETECTED NOT DETECTED Final   Candida glabrata NOT DETECTED NOT DETECTED Final   Candida krusei NOT DETECTED NOT DETECTED Final   Candida parapsilosis NOT DETECTED NOT DETECTED Final   Candida tropicalis NOT DETECTED NOT DETECTED Final   Cryptococcus neoformans/gattii NOT DETECTED NOT DETECTED Final   Meth resistant mecA/C and MREJ NOT DETECTED NOT DETECTED Final    Comment: Performed at [facility], [place] [address] [place], [place] [address]  Surgical PCR screen     Status: None   Collection Time: 04/18/21  5:49 AM   Specimen: Nasal Mucosa; Nasal Swab  Result Value Ref Range Status   MRSA, PCR NEGATIVE NEGATIVE Final   Staphylococcus aureus NEGATIVE NEGATIVE Final    Comment: (NOTE) The Xpert SA Assay (FDA approved for NASAL specimens in patients 41 years of age and older), is one component of a comprehensive surveillance program. It is not intended to diagnose infection nor to guide or monitor treatment. Performed at [facility], [place] [address] [place], [place] [address]   Culture, blood (routine x 2)     Status: None   Collection Time: 04/21/21  3:32 AM   Specimen: BLOOD  Result Value Ref Range Status   Specimen Description   Final    BLOOD BLOOD LEFT ARM Performed at [facility] [address] [place], [place] [address]    Special Requests   Final    BOTTLES DRAWN AEROBIC ONLY Blood Culture adequate volume Performed at [facility] [address] [place], [place] [address]    Culture   Final     NO GROWTH 5 DAYS Performed at [facility], [place] [address] [place], [place] [address]    Report Status 04/26/2021 FINAL  Final  Culture, blood (routine x 2)     Status: None   Collection Time: 04/21/21  3:32 AM   Specimen: BLOOD LEFT HAND  Result Value Ref Range Status   Specimen Description   Final    BLOOD LEFT HAND Performed at [facility], [place] [address] [place], [place] [address]    Special Requests   Final    BOTTLES DRAWN AEROBIC ONLY Blood Culture adequate volume Performed at [facility] [address], [place], [place] [address]    Culture   Final    NO GROWTH 5 DAYS Performed at [facility], [name] [address], [place], [place] [address]    Report Status 04/26/2021 FINAL  Final         Radiology Studies: ECHO TEE  Result Date: 04/27/2021    TRANSESOPHOGEAL ECHO REPORT   Patient Name:   [name] Date of Exam: 04/27/2021 Medical Rec #:  [id number]        Height:       64.0 in Accession #:    [id number]  Weight:       196.0 lb Date of Birth:  1970-08-22        BSA:          1.940 m Patient Age:    84 years         BP:           143/92 mmHg Patient Gender: M                HR:           85 bpm. Exam Location:  Inpatient Procedure: 2D Echo, Cardiac Doppler and Color Doppler Indications:     Bacteremia  History:         Patient has prior history of Echocardiogram examinations, most                  recent 04/19/2021. Signs/Symptoms:Bacteremia; Risk                  Factors:Diabetes, Current Smoker, Dyslipidemia and                  Hypertension.  Sonographer:     Roseanna Rainbow RDCS Referring Phys:  [place] Diagnosing Phys: Kirk Ruths MD PROCEDURE: After discussion of the risks and benefits of a TEE, an informed consent was obtained from the patient. The transesophogeal probe was passed without difficulty through the esophogus of the patient. Imaged were obtained with the patient in a left lateral decubitus position. Sedation  performed by different physician. The patient was monitored while under deep sedation. Anesthestetic sedation was provided intravenously by Anesthesiology: 250m of Propofol, 889mof Lidocaine. The patient's vital signs; including heart rate, blood pressure, and oxygen saturation; remained stable throughout the procedure. The patient developed no complications during the procedure. IMPRESSIONS  1. Small oscillating density on posterior MV leaflet; possible vegetation; trace MR; suggest empiric therapy for possible SBE with FU TEE 8-12 weeks.  2. Left ventricular ejection fraction, by estimation, is 60 to 65%. The left ventricle has normal function. The left ventricle has no regional wall motion abnormalities.  3. Right ventricular systolic function is normal. The right ventricular size is normal.  4. No left atrial/left atrial appendage thrombus was detected.  5. A small pericardial effusion is present.  6. The mitral valve is abnormal. Trivial mitral valve regurgitation.  7. The aortic valve is tricuspid. Aortic valve regurgitation is not visualized.  8. There is mild (Grade II) plaque involving the descending aorta. FINDINGS  Left Ventricle: Left ventricular ejection fraction, by estimation, is 60 to 65%. The left ventricle has normal function. The left ventricle has no regional wall motion abnormalities. The left ventricular internal cavity size was normal in size. Right Ventricle: The right ventricular size is normal. No increase in right ventricular wall thickness. Right ventricular systolic function is normal. Left Atrium: Left atrial size was normal in size. No left atrial/left atrial appendage thrombus was detected. Right Atrium: Right atrial size was normal in size. Pericardium: A small pericardial effusion is present. Mitral Valve: The mitral valve is abnormal. Trivial mitral valve regurgitation. Tricuspid Valve: The tricuspid valve is normal in structure. Tricuspid valve regurgitation is trivial. Aortic  Valve: The aortic valve is tricuspid. Aortic valve regurgitation is not visualized. Pulmonic Valve: The pulmonic valve was normal in structure. Pulmonic valve regurgitation is trivial. Aorta: The aortic root is normal in size and structure. There is mild (Grade II) plaque involving the descending aorta. IAS/Shunts: No atrial level shunt detected by color  flow Doppler. Additional Comments: Small oscillating density on posterior MV leaflet; possible vegetation; trace MR; suggest empiric therapy for possible SBE with FU TEE 8-12 weeks. Kirk Ruths MD Electronically signed by Kirk Ruths MD Signature Date/Time: 04/27/2021/1:14:48 PM    Final    [place] EKG SITE RITE  Result Date: 04/27/2021 If Site Rite image not attached, placement could not be confirmed due to current cardiac rhythm.   Scheduled Meds:  acetaminophen  1,000 mg Oral TID   amLODipine  10 mg Oral Daily   vitamin C  500 mg Oral Daily   aspirin EC  81 mg Oral q morning   atorvastatin  20 mg Oral Daily   carvedilol  25 mg Oral BID   cloNIDine  0.1 mg Oral BID   ferrous sulfate  325 mg Oral Q breakfast   gabapentin  300 mg Oral BID   insulin aspart  0-9 Units Subcutaneous TID WC   multivitamin with minerals  1 tablet Oral Daily   ondansetron (ZOFRAN) IV  4 mg Intravenous Q6H   polyethylene glycol  17 g Oral Daily   Ensure Max Protein  11 oz Oral BID   senna-docusate  1 tablet Oral BID   tamsulosin  0.4 mg Oral QPC supper   Continuous Infusions:   ceFAZolin (ANCEF) IV 2 g (04/27/21 0504)     LOS: 10 days    Time spent: 50 mins.    Shawna Clamp, MD [facility]   If 7PM-7AM, please contact night-coverage

## 2021-04-27 NOTE — Interval H&P Note (Signed)
History and Physical Interval Note:  04/27/2021 7:51 AM  [name]  has presented today for surgery, with the diagnosis of BACTEREMIA.  The various methods of treatment have been discussed with the patient and family. After consideration of risks, benefits and other options for treatment, the patient has consented to  Procedure(s): TRANSESOPHAGEAL ECHOCARDIOGRAM (TEE) (N/A) as a surgical intervention.  The patient's history has been reviewed, patient examined, no change in status, stable for surgery.  I have reviewed the patient's chart and labs.  Questions were answered to the patient's satisfaction.     Kirk Ruths

## 2021-04-27 NOTE — Transfer of Care (Signed)
Immediate Anesthesia Transfer of Care Note  Patient: Shakir Petrosino Hamza  Procedure(s) Performed: TRANSESOPHAGEAL ECHOCARDIOGRAM (TEE)  Patient Location: PACU  Anesthesia Type:MAC  Level of Consciousness: drowsy and patient cooperative  Airway & Oxygen Therapy: Patient Spontanous Breathing and Patient connected to face mask oxygen  Post-op Assessment: Report given to RN and Post -op Vital signs reviewed and stable  Post vital signs: Reviewed and stable  Last Vitals:  Vitals Value Taken Time  BP 103/77 04/27/21 0938  Temp    Pulse 86 04/27/21 0941  Resp 14 04/27/21 0941  SpO2 100 % 04/27/21 0941  Vitals shown include unvalidated device data.  Last Pain:  Vitals:   04/27/21 0848  TempSrc: Temporal  PainSc: 4       Patients Stated Pain Goal: 2 (52/71/29 2909)  Complications: No notable events documented.

## 2021-04-28 ENCOUNTER — Ambulatory Visit: Payer: Self-pay | Admitting: *Deleted

## 2021-04-28 ENCOUNTER — Encounter (HOSPITAL_COMMUNITY): Payer: Self-pay | Admitting: Cardiology

## 2021-04-28 ENCOUNTER — Ambulatory Visit: Payer: Medicaid Other | Admitting: Podiatry

## 2021-04-28 ENCOUNTER — Telehealth: Payer: Self-pay | Admitting: Podiatry

## 2021-04-28 DIAGNOSIS — A419 Sepsis, unspecified organism: Secondary | ICD-10-CM | POA: Diagnosis not present

## 2021-04-28 DIAGNOSIS — R6520 Severe sepsis without septic shock: Secondary | ICD-10-CM | POA: Diagnosis not present

## 2021-04-28 LAB — BASIC METABOLIC PANEL
Anion gap: 8 (ref 5–15)
BUN: 27 mg/dL — ABNORMAL HIGH (ref 6–20)
CO2: 23 mmol/L (ref 22–32)
Calcium: 8.5 mg/dL — ABNORMAL LOW (ref 8.9–10.3)
Chloride: 105 mmol/L (ref 98–111)
Creatinine, Ser: 2.59 mg/dL — ABNORMAL HIGH (ref 0.61–1.24)
GFR, Estimated: 29 mL/min — ABNORMAL LOW (ref >60)
Glucose, Bld: 84 mg/dL (ref 70–99)
Potassium: 4.1 mmol/L (ref 3.5–5.1)
Sodium: 136 mmol/L (ref 135–145)

## 2021-04-28 LAB — GLUCOSE, CAPILLARY
Glucose-Capillary: 72 mg/dL (ref 70–99)
Glucose-Capillary: 88 mg/dL (ref 70–99)
Glucose-Capillary: 98 mg/dL (ref 70–99)

## 2021-04-28 LAB — CBC
HCT: 27.1 % — ABNORMAL LOW (ref 39.0–52.0)
Hemoglobin: 8.5 g/dL — ABNORMAL LOW (ref 13.0–17.0)
MCH: 27.6 pg (ref 26.0–34.0)
MCHC: 31.4 g/dL (ref 30.0–36.0)
MCV: 88 fL (ref 80.0–100.0)
Platelets: 409 10*3/uL — ABNORMAL HIGH (ref 150–400)
RBC: 3.08 MIL/uL — ABNORMAL LOW (ref 4.22–5.81)
RDW: 14.6 % (ref 11.5–15.5)
WBC: 11.8 10*3/uL — ABNORMAL HIGH (ref 4.0–10.5)
nRBC: 0 % (ref 0.0–0.2)

## 2021-04-28 LAB — MAGNESIUM: Magnesium: 2.1 mg/dL (ref 1.7–2.4)

## 2021-04-28 LAB — PHOSPHORUS: Phosphorus: 4 mg/dL (ref 2.5–4.6)

## 2021-04-28 MED ORDER — TAMSULOSIN HCL 0.4 MG PO CAPS: 0.4000 mg | ORAL_CAPSULE | Freq: Every day | ORAL | 1 refills | Status: DC

## 2021-04-28 MED ORDER — CEFAZOLIN IV (FOR PTA / DISCHARGE USE ONLY): 2.0000 g | Freq: Three times a day (TID) | INTRAVENOUS | 0 refills | Status: AC

## 2021-04-28 MED ORDER — SODIUM CHLORIDE 0.9% FLUSH: 10.0000 mL | INTRAVENOUS | Status: DC | PRN

## 2021-04-28 MED ORDER — HEPARIN SOD (PORK) LOCK FLUSH 100 UNIT/ML IV SOLN
250.0000 [IU] | INTRAVENOUS | Status: AC | PRN
  Administered 2021-04-28: 250 [IU]
  Filled 2021-04-28: qty 2.5

## 2021-04-28 MED ORDER — SODIUM CHLORIDE 0.9% FLUSH
10.0000 mL | Freq: Two times a day (BID) | INTRAVENOUS | Status: DC
  Administered 2021-04-28: 10 mL

## 2021-04-28 MED ORDER — CHLORHEXIDINE GLUCONATE CLOTH 2 % EX PADS
6.0000 | MEDICATED_PAD | Freq: Every day | CUTANEOUS | Status: DC
  Administered 2021-04-28: 6 via TOPICAL

## 2021-04-28 NOTE — Progress Notes (Signed)
Brief note:  Patient visited bedside. Prevena incisional vac placed on the patient prior to discharge. Patient to follow up with Dr. March Rummage in office in 1 week for continued post op care. Dr. Landis Martins On Call provider  [facility] [phone] office [phone] cell Available via secure chat

## 2021-04-28 NOTE — Progress Notes (Addendum)
AVS reviewed with patient. All questions answered,    Called and spoke to [place] in [place] for prevana wound vac.

## 2021-04-28 NOTE — TOC Transition Note (Addendum)
Transition of Care [facility]) - CM/SW Discharge Note   Patient Details  Name: [name] MRN: [id number] Date of Birth: 05-Jul-1970  [facility] CM/SW Contact:  Ross Ludwig, LCSW Phone Number: 04/28/2021, 1:21 PM   Clinical Narrative:     Patient will be going home with home health and IV antibiotics through [place].  Pam aware and will provide [facility] RN for patient.  Patient needed a wheelchair which was delivered from [facility] yesterday.  CSW signing off, please reconsult if social work needs arise.  Patient will be getting a Prevana wound vac this evening prior to discharge.   Final next level of care: [place] Barriers to Discharge: Barriers Resolved   Patient Goals and CMS Choice Patient states their goals for this hospitalization and ongoing recovery are:: To return back home. CMS Medicare.gov Compare Post Acute Care list provided to:: Patient Choice offered to / list presented to : Patient  Discharge Placement                       Discharge Plan and Services In-house Referral: Clinical Social Work   Post Acute Care Choice: Home Health, Durable Medical Equipment          DME Arranged: Youth worker wheelchair with seat cushion DME Agency: [employer] Date DME Agency Contacted: 04/27/21 Time DME Agency Contacted: [id number] Representative spoke with at DME Agency: Andee Poles HH Arranged: RN, PT, OT, Nurse's Aide [place] Agency: [place] Date [place]: 04/28/21 Time [place]: 45 Representative spoke with at [place]: [place] ([place]) Interventions     Readmission Risk Interventions No flowsheet data found.

## 2021-04-28 NOTE — Telephone Encounter (Signed)
Opened chart in error.

## 2021-04-28 NOTE — Care Management Important Message (Signed)
Important Message  Patient Details IM Letter given to the Patient. Name: [name] MRN: [id number] Date of Birth: Mar 02, 1971   Medicare Important Message Given:  Yes     Kerin Salen 04/28/2021, 12:34 PM

## 2021-04-28 NOTE — Telephone Encounter (Signed)
Pt is currently in the hospital and also had an appt with [place] today for a wound VAC. I contacted [facility] and spoke with his nurse to see what time he would be discharged. They were unable to give [place] a time but did mention that he wouldn't be discharged until after his appt. I have contacted our on call provider to see if she will come see him after clinic to apply his wound VAC. I have also notified his nurse that a doctor may be coming around 7 tonight. I also spoke with the patient and his expresses understanding.

## 2021-04-28 NOTE — Discharge Summary (Signed)
Physician Discharge Summary  Dominyk Law Kealey PNT:[id number] DOB: 1970-05-03 DOA: 04/17/2021  PCP: Camillia Herter, NP  Admit date: 04/17/2021  Discharge date: 04/28/2021  Admitted From: Home.  Disposition:  Home Health Services.  Recommendations for Outpatient Follow-up:  Follow up with PCP in 1-2 weeks. Please obtain BMP/CBC in one week. Advised to follow-up with podiatrist Dr. March Rummage today at 4:30 pm for wound VAC. Patient is being discharged home with PICC line.   Home health services been arranged.  Advised to continue IV Ancef 2 g q 8hr  Follow-up infectious diseases as scheduled.  Home Health:Yes HHPT/OT /RN Equipment/Devices:None  Discharge Condition: Stable CODE STATUS: Full code Diet recommendation: Heart Healthy  Brief [facility] Course: This 51 year old male with PMH significant of DM2, HTN, CKD 3B, recurrent right foot wound seen by podiatry as an outpatient comes into the hospital due to worsening of his wound.  He was sent here from his podiatrist office.  Blood cultures have returned positive for MSSA and ID was consulted.  He was taken to the OR on 1/25 and underwent final debridement.  Given MSSA bacteremia  TEE was done showed small vegetation om MV, ID recommended 6 weeks of IV antibiotics.  Patient was seen by infectious diseases, cardiology and podiatry.  Home health services been arranged patient has received PICC line.  Home health nurse will be visiting patient to give IV antibiotics.  Patient will continue antibiotic for 6 weeks.  Patient will follow up with podiatrist Dr. March Rummage today for wound VAC placement.  Patient will follow up with infectious diseases in 6 weeks.  Appointment has been made.  Patient feels better and want to be discharged.  Patient is being discharged home.  He was managed for below problems.  Discharge Diagnoses:  Principal Problem:   Severe sepsis ([place]) Active Problems:   Essential hypertension   Diabetic neuropathy ([name])    Stage 3b chronic kidney disease (CKD) ([name])   Obesity   Anemia in chronic kidney disease (CKD)   Type 2 diabetes mellitus with hyperglycemia ([name])   Acute kidney injury superimposed on CKD ([place])   Mixed hyperlipidemia   MSSA bacteremia   Endocarditis of mitral valve  Severe sepsis secondary to MSSA bacteremia: Likely source is from right foot. Podiatry and ID following.   He met sepsis criteria on admission with tachycardia, increased WBC as well as acute kidney injury.   He was initially placed on vancomycin, now transitioned to cefazolin.  Sepsis physiology resolved.   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   He underwent TEE on 04/27/2021.  He was found to have a small vegetation on mitral valve.   ID recommended 6 weeks of IV cefazolin.  Patient has received PICC line today. Patient feels better and patient is being discharged home,  home health services been arranged.   Acute kidney injury on chronic kidney disease stage IIIb: Baseline serum creatinine 2.4-2.6.  Initially improved but increased again in the 3.0 range. Continue IV hydration.  Serum creatinine improved to 2.7. Continue to hold further fluids,  Encourage p.o. intake.   Essential hypertension: Blood pressure has improved.   Continue amlodipine, Coreg.   Hyperlipidemia: Continue statin   Hyponatremia: Mild, continue to monitor.   Anemia due to chronic kidney disease.: H&H is stable.   Obesity / BMI 33: Continue diet and exercise.   BPH: Continue Flomax   Type 2 diabetes: Neuropathy:  Continue sliding scale, gabapentin  Discharge Instructions  Discharge Instructions     [facility] pharmacist to adjust dose for Vancomycin, Aminoglycosides and other anti-infective therapies as requested by physician.   Complete by: As directed    [facility] to provide Cath Flo 2mg    Complete by: As directed    Administer for PICC line occlusion  and as ordered by physician for other access device issues.   Anaphylaxis Kit: Provided to treat any anaphylactic reaction to the medication being provided to the patient if First Dose or when requested by physician   Complete by: As directed    Epinephrine 1mg /ml vial / amp: Administer 0.3mg  (0.56ml) subcutaneously once for moderate to severe anaphylaxis, nurse to call physician and pharmacy when reaction occurs and call 911 if needed for immediate care   Diphenhydramine 50mg /ml IV vial: Administer 25-50mg  IV/IM PRN for first dose reaction, rash, itching, mild reaction, nurse to call physician and pharmacy when reaction occurs   Sodium Chloride 0.9% NS 572ml IV: Administer if needed for hypovolemic blood pressure drop or as ordered by physician after call to physician with anaphylactic reaction   Call MD for:  persistant dizziness or light-headedness   Complete by: As directed    Call MD for:  persistant nausea and vomiting   Complete by: As directed    Call MD for:  redness, tenderness, or signs of infection (pain, swelling, redness, odor or green/yellow discharge around incision site)   Complete by: As directed    Change dressing on IV access line weekly and PRN   Complete by: As directed    Diet - low sodium heart healthy   Complete by: As directed    Diet Carb Modified   Complete by: As directed    Discharge instructions   Complete by: As directed    Advised to follow-up with primary care physician in 1 week. Advised to follow-up with podiatrist Dr. March Rummage today at 4:30 PM for wound VAC. Patient has been discharged home with PICC line.   Home health services been arranged.  Advised to continue IV Ancef 2 g daily.   Follow-up infectious diseases as scheduled.   Discharge wound care:   Complete by: As directed    Follow-up Dr. March Rummage today for wound VAC.   Flush IV access with Sodium Chloride 0.9% and Heparin 10 units/ml or 100 units/ml   Complete by: As directed    Home infusion  instructions - [facility]   Complete by: As directed    Instructions: Flush IV access with Sodium Chloride 0.9% and Heparin 10units/ml or 100units/ml   Change dressing on IV access line: Weekly and PRN   Instructions Cath Flo 2mg : Administer for PICC Line occlusion and as ordered by physician for other access device   [facility] pharmacist to adjust dose for: Vancomycin, Aminoglycosides and other anti-infective therapies as requested by physician   Increase activity slowly   Complete by: As directed    Method of administration may be changed at the discretion of home infusion pharmacist based upon assessment of the patient and/or caregivers ability to self-administer the medication ordered   Complete by: As directed       Allergies as of 04/28/2021       Reactions   Bee Venom Swelling   SWELLING REACTION UNSPECIFIED    Penicillins Hives, Other (See Comments)   Full body hives as a child with no shortness of breath or swelling  **Can tolerate cephalosporins**   Clonidine Derivatives  Pt states "It is making me dizzy"        Medication List     STOP taking these medications    HYDROcodone-acetaminophen 5-325 MG tablet Commonly known as: NORCO/VICODIN       TAKE these medications    Accu-Chek FastClix Lancets Misc 1 Package by Does not apply route as directed. Use as instructed to test blood sugar 2 times daily E11.65   Acetaminophen Extra Strength 500 MG tablet Generic drug: acetaminophen TAKE 2 TABLETS EVERY 6 HOURS AS NEEDED FOR PAIN What changed: See the new instructions.   amLODipine 10 MG tablet Commonly known as: NORVASC Take 1 tablet (10 mg total) by mouth daily.   Aspirin Adult Low Strength 81 MG EC tablet Generic drug: aspirin TAKE 1 TABLET BY MOUTH EVERY MORNING What changed:  how much to take when to take this   atorvastatin 20 MG tablet Commonly known as: LIPITOR Take 1 tablet (20 mg total) by mouth daily.   b complex  vitamins tablet Take 1 tablet by mouth daily.   B-complex with vitamin C tablet TAKE 1 TABLET EVERY MORNING What changed: when to take this   blood glucose meter kit and supplies Kit Dispense based on patient and insurance preference. Use up to four times daily as directed. (FOR ICD-9 250.00, 250.01).   Blood Pressure Monitor Devi Please provide patient with insurance approved blood pressure monitor. I10.0   carvedilol 25 MG tablet Commonly known as: Coreg Take 1 tablet (25 mg total) by mouth 2 (two) times daily.   ceFAZolin  IVPB Commonly known as: ANCEF Inject 2 g into the vein every 8 (eight) hours. Indication:  MSSA IE First Dose: Yes Last Day of Therapy:  06/02/21 Labs - Once weekly:  CBC/D and BMP, Labs - Every other week:  ESR and CRP Method of administration: IV Push Pull PICC at the end of therapy Method of administration may be changed at the discretion of home infusion pharmacist based upon assessment of the patient and/or caregiver's ability to self-administer the medication ordered.   cloNIDine 0.1 MG tablet Commonly known as: CATAPRES TAKE 1 TABLET BY MOUTH TWICE  DAILY   ferrous sulfate 325 (65 FE) MG EC tablet TAKE 1 TABLET BY MOUTH EVERY MORNING What changed: when to take this   FreeStyle Libre 2 Reader [facility] Monitor blood glucose levels 4-5 times per day. ICD10 E11.42 Z79.4   FreeStyle Libre 2 Sensor Misc Monitor blood glucose levels 4-5 times per day. ICD10 E11.42 Z79.4   gabapentin 300 MG capsule Commonly known as: Neurontin Take 1 capsule (300 mg total) by mouth 3 (three) times daily. Patient needs office visit before refills will be given What changed: additional instructions   glucose blood test strip Use as instructed to test blood sugar 2 times daily E11.65   Insulin Pen Needle 32G X 4 MM Misc Use as instructed. Inject into the skin three time daily.   Lantus SoloStar 100 UNIT/ML Solostar Pen Generic drug: insulin glargine INJECT  SUBCUTANEOUSLY 15 UNITS  TWICE DAILY What changed:  how much to take when to take this   Misc. Devices Misc Please provide patient with insurance approved diabetic shoes. E11.65   polyethylene glycol 17 g packet Commonly known as: MIRALAX / GLYCOLAX Take 17 g by mouth daily as needed for mild constipation or moderate constipation.   senna 8.6 MG Tabs tablet Commonly known as: SENOKOT TAKE 1 TABLET BY MOUTH DAILY AS NEEDED FOR CONSTIPATION What changed: See the new instructions.  tamsulosin 0.4 MG Caps capsule Commonly known as: FLOMAX Take 1 capsule (0.4 mg total) by mouth daily after supper.   triamcinolone cream 0.1 % Commonly known as: KENALOG Apply 1 application topically 2 (two) times daily. What changed:  when to take this reasons to take this   Trulicity 4.12 IN/8.6VE Sopn Generic drug: Dulaglutide Inject 3 mg into the skin once a week. Tuesday               Durable Medical Equipment  (From admission, onward)           Start     Ordered   04/24/21 2013  For home use only DME wheelchair cushion (seat and back)  Once       Comments: With leg rest   04/24/21 2012              Discharge Care Instructions  (From admission, onward)           Start     Ordered   04/28/21 0000  Change dressing on IV access line weekly and PRN  (Home infusion instructions - [facility] )        04/28/21 1023   04/28/21 0000  Discharge wound care:       Comments: Follow-up Dr. March Rummage today for wound VAC.   04/28/21 [facility] .   Specialty: Cardiology Contact information: [address] [id number] [place]        Camillia Herter, NP Follow up in 1 week(s).   Specialty: Nurse Practitioner Contact information: [place] [address] [phone]         Jettie Booze, MD .    Specialties: Cardiology, Radiology, Interventional Cardiology Contact information: [address] [address] [phone]         [name], DPM Follow up.   Specialty: Podiatry Contact information: [address] [address] [phone]         Jabier Mutton, MD Follow up in 4 week(s).   Specialty: Infectious Diseases Contact information: [address] [place] [address] [phone]                Allergies  Allergen Reactions   Bee Venom Swelling    SWELLING REACTION UNSPECIFIED    Penicillins Hives and Other (See Comments)    Full body hives as a child with no shortness of breath or swelling  **Can tolerate cephalosporins**   Clonidine Derivatives     Pt states "It is making me dizzy"    Consultations: Infectious disease Cardiology Podiatry   Procedures/Studies: MR FOOT RIGHT W WO CONTRAST  Result Date: 04/13/2021 CLINICAL DATA:  Foot wound, prior amputation, osteomyelitis suspected. EXAM: MRI OF THE RIGHT FOREFOOT WITHOUT AND WITH CONTRAST TECHNIQUE: Multiplanar, multisequence MR imaging of the right was performed before and after the administration of intravenous contrast. CONTRAST:  10mL GADAVIST GADOBUTROL 1 MMOL/ML IV SOLN COMPARISON:  None. FINDINGS: Bones/Joint/Cartilage There is a peripherally enhancing abscess within the cuboid measuring at least 0.7 x 3 point 4 x 1.5 cm. There is marked edema and enhancement of the navicular bone or the cuneiforms and proximal aspect of the residual metatarsals concerning for acute osteomyelitis. Ligaments Tendinopathy and edema of the flexor, peroneal and extensor compartment tendons. Muscles  and Tendons Hyperintense signal of the plantar muscles consistent with diabetic myopathy/myositis. Soft tissues Deep skin wound about the lateral aspect of the cuboid with a peripherally enhancing collection measuring at least 1.9 x 1.3 by 2.9 cm with marked  surrounding edema. Marked skin thickening and generalized subcutaneous soft tissue edema of the ankle/foot. IMPRESSION: 1. Intraosseous abscess in the cuboid and edema of the navicular bone, all the cuneiforms and residual metatarsals consistent with ongoing osteomyelitis. 2. Deep skin wound about the lateral aspect of the cuboid with peripherally enhancing collection measuring at least 1.9 x 1.3 x 2.9 cm, consistent with abscess. 3. Marked skin thickening and generalized subcutaneous soft tissue edema consistent with cellulitis. Electronically Signed   By: Keane Police D.O.   On: 04/13/2021 15:50   ECHOCARDIOGRAM COMPLETE  Result Date: 04/19/2021    ECHOCARDIOGRAM REPORT   Patient Name:   AMATO SEVILLANO Date of Exam: 04/19/2021 Medical Rec #:  [id number]        Height:       64.0 in Accession #:    [id number]       Weight:       196.0 lb Date of Birth:  1970-10-18        BSA:          1.940 m Patient Age:    72 years         BP:           136/85 mmHg Patient Gender: M                HR:           89 bpm. Exam Location:  Inpatient Procedure: 2D Echo, Cardiac Doppler and Color Doppler Indications:    BACTEREMIA  History:        Patient has prior history of Echocardiogram examinations, most                 recent 03/31/2020. CAD; Risk Factors:Diabetes and Hypertension.                 HLD.  Sonographer:    Beryle Beams Referring Phys: [id number]  1. Left ventricular ejection fraction, by estimation, is 60 to 65%. The left ventricle has normal function. The left ventricle has no regional wall motion abnormalities. Left ventricular diastolic parameters were normal.  2. Right ventricular systolic function is normal. The right ventricular size is normal.  3. Trivial effuson persists since 03/31/21 not circumferential. The pericardial effusion is posterior to the left ventricle.  4. The mitral valve is abnormal. Trivial mitral valve regurgitation. No evidence of mitral stenosis.  5. The aortic  valve was not well visualized. There is mild calcification of the aortic valve. There is mild thickening of the aortic valve. Aortic valve regurgitation is not visualized. Aortic valve sclerosis is present, with no evidence of aortic valve  stenosis.  6. The inferior vena cava is normal in size with greater than 50% respiratory variability, suggesting right atrial pressure of 3 mmHg. FINDINGS  Left Ventricle: Left ventricular ejection fraction, by estimation, is 60 to 65%. The left ventricle has normal function. The left ventricle has no regional wall motion abnormalities. The left ventricular internal cavity size was normal in size. There is  no left ventricular hypertrophy. Left ventricular diastolic parameters were normal. Right Ventricle: The right ventricular size is normal. No increase in right ventricular wall thickness. Right ventricular systolic function is normal. Left Atrium: Left atrial size was normal in  size. Right Atrium: Right atrial size was normal in size. Pericardium: Trivial effuson persists since 03/31/21 not circumferential. Trivial pericardial effusion is present. The pericardial effusion is posterior to the left ventricle. Mitral Valve: The mitral valve is abnormal. There is mild thickening of the mitral valve leaflet(s). Trivial mitral valve regurgitation. No evidence of mitral valve stenosis. Tricuspid Valve: The tricuspid valve is normal in structure. Tricuspid valve regurgitation is not demonstrated. No evidence of tricuspid stenosis. Aortic Valve: The aortic valve was not well visualized. There is mild calcification of the aortic valve. There is mild thickening of the aortic valve. Aortic valve regurgitation is not visualized. Aortic valve sclerosis is present, with no evidence of aortic valve stenosis. Aortic valve mean gradient measures 4.0 mmHg. Aortic valve peak gradient measures 7.8 mmHg. Aortic valve area, by VTI measures 1.78 cm. Pulmonic Valve: The pulmonic valve was normal in  structure. Pulmonic valve regurgitation is not visualized. No evidence of pulmonic stenosis. Aorta: The aortic root is normal in size and structure. Venous: The inferior vena cava is normal in size with greater than 50% respiratory variability, suggesting right atrial pressure of 3 mmHg. IAS/Shunts: No atrial level shunt detected by color flow Doppler.  LEFT VENTRICLE PLAX 2D LVIDd:         3.90 cm     Diastology LVIDs:         2.60 cm     LV e' medial:    8.92 cm/s LV PW:         1.00 cm     LV E/e' medial:  10.5 LV IVS:        0.80 cm     LV e' lateral:   10.90 cm/s LVOT diam:     1.80 cm     LV E/e' lateral: 8.6 LV SV:         46 LV SV Index:   24 LVOT Area:     2.54 cm  LV Volumes (MOD) LV vol d, MOD A2C: 76.2 ml LV vol d, MOD A4C: 63.9 ml LV vol s, MOD A2C: 32.6 ml LV vol s, MOD A4C: 25.7 ml LV SV MOD A2C:     43.6 ml LV SV MOD A4C:     63.9 ml LV SV MOD BP:      39.1 ml RIGHT VENTRICLE RV S prime:     13.20 cm/s TAPSE (M-mode): 2.0 cm LEFT ATRIUM             Index        RIGHT ATRIUM           Index LA diam:        3.10 cm 1.60 cm/m   RA Area:     11.80 cm LA Vol (A2C):   60.7 ml 31.29 ml/m  RA Volume:   27.50 ml  14.18 ml/m LA Vol (A4C):   38.2 ml 19.69 ml/m LA Biplane Vol: 49.9 ml 25.73 ml/m  AORTIC VALVE                    PULMONIC VALVE AV Area (Vmax):    1.76 cm     PV Vmax:       0.81 m/s AV Area (Vmean):   1.67 cm     PV Vmean:      55.500 cm/s AV Area (VTI):     1.78 cm     PV VTI:        0.184 m AV Vmax:  140.00 cm/s  PV Peak grad:  2.6 mmHg AV Vmean:          95.000 cm/s  PV Mean grad:  1.0 mmHg AV VTI:            0.260 m AV Peak Grad:      7.8 mmHg AV Mean Grad:      4.0 mmHg LVOT Vmax:         96.80 cm/s LVOT Vmean:        62.400 cm/s LVOT VTI:          0.182 m LVOT/AV VTI ratio: 0.70  AORTA Ao Root diam: 2.70 cm Ao Asc diam:  2.50 cm MITRAL VALVE MV Area (PHT): 3.99 cm     SHUNTS MV Decel Time: 190 msec     Systemic VTI:  0.18 m MV E velocity: 93.33 cm/s   Systemic Diam: 1.80  cm MV A velocity: 118.00 cm/s MV E/A ratio:  0.79 Jenkins Rouge MD Electronically signed by Jenkins Rouge MD Signature Date/Time: 04/19/2021/11:54:23 AM    Final    ECHO TEE  Result Date: 04/27/2021    TRANSESOPHOGEAL ECHO REPORT   Patient Name:   GLENDALE YOUNGBLOOD Date of Exam: 04/27/2021 Medical Rec #:  [id number]        Height:       64.0 in Accession #:    [id number]       Weight:       196.0 lb Date of Birth:  01-24-1971        BSA:          1.940 m Patient Age:    51 years         BP:           143/92 mmHg Patient Gender: M                HR:           85 bpm. Exam Location:  Inpatient Procedure: 2D Echo, Cardiac Doppler and Color Doppler Indications:     Bacteremia  History:         Patient has prior history of Echocardiogram examinations, most                  recent 04/19/2021. Signs/Symptoms:Bacteremia; Risk                  Factors:Diabetes, Current Smoker, Dyslipidemia and                  Hypertension.  Sonographer:     Roseanna Rainbow RDCS Referring Phys:  [place] Diagnosing Phys: Kirk Ruths MD PROCEDURE: After discussion of the risks and benefits of a TEE, an informed consent was obtained from the patient. The transesophogeal probe was passed without difficulty through the esophogus of the patient. Imaged were obtained with the patient in a left lateral decubitus position. Sedation performed by different physician. The patient was monitored while under deep sedation. Anesthestetic sedation was provided intravenously by Anesthesiology: 203mg  of Propofol, 80mg  of Lidocaine. The patient's vital signs; including heart rate, blood pressure, and oxygen saturation; remained stable throughout the procedure. The patient developed no complications during the procedure. IMPRESSIONS  1. Small oscillating density on posterior MV leaflet; possible vegetation; trace MR; suggest empiric therapy for possible SBE with FU TEE 8-12 weeks.  2. Left ventricular ejection fraction, by estimation, is 60 to 65%. The left  ventricle has normal function. The left ventricle has no regional wall motion  abnormalities.  3. Right ventricular systolic function is normal. The right ventricular size is normal.  4. No left atrial/left atrial appendage thrombus was detected.  5. A small pericardial effusion is present.  6. The mitral valve is abnormal. Trivial mitral valve regurgitation.  7. The aortic valve is tricuspid. Aortic valve regurgitation is not visualized.  8. There is mild (Grade II) plaque involving the descending aorta. FINDINGS  Left Ventricle: Left ventricular ejection fraction, by estimation, is 60 to 65%. The left ventricle has normal function. The left ventricle has no regional wall motion abnormalities. The left ventricular internal cavity size was normal in size. Right Ventricle: The right ventricular size is normal. No increase in right ventricular wall thickness. Right ventricular systolic function is normal. Left Atrium: Left atrial size was normal in size. No left atrial/left atrial appendage thrombus was detected. Right Atrium: Right atrial size was normal in size. Pericardium: A small pericardial effusion is present. Mitral Valve: The mitral valve is abnormal. Trivial mitral valve regurgitation. Tricuspid Valve: The tricuspid valve is normal in structure. Tricuspid valve regurgitation is trivial. Aortic Valve: The aortic valve is tricuspid. Aortic valve regurgitation is not visualized. Pulmonic Valve: The pulmonic valve was normal in structure. Pulmonic valve regurgitation is trivial. Aorta: The aortic root is normal in size and structure. There is mild (Grade II) plaque involving the descending aorta. IAS/Shunts: No atrial level shunt detected by color flow Doppler. Additional Comments: Small oscillating density on posterior MV leaflet; possible vegetation; trace MR; suggest empiric therapy for possible SBE with FU TEE 8-12 weeks. Kirk Ruths MD Electronically signed by Kirk Ruths MD Signature Date/Time:  04/27/2021/1:14:48 PM    Final    [place] EKG SITE RITE  Result Date: 04/27/2021 If Site Rite image not attached, placement could not be confirmed due to current cardiac rhythm.     Subjective: Patient was seen and examined at bedside.  Overnight events noted.  Patient reports feeling better. Patient had a PICC line.  Patient wants to be discharged,  he has appointment with Dr. March Rummage today at 430.  Discharge Exam: Vitals:   04/28/21 0541 04/28/21 1156  BP: (!) 152/84 (!) 153/92  Pulse: 83 82  Resp: 16 16  Temp: 98.7 F (37.1 C) 97.7 F (36.5 C)  SpO2: 100% 100%   Vitals:   04/27/21 2133 04/28/21 0120 04/28/21 0541 04/28/21 1156  BP: (!) 153/92 (!) 163/92 (!) 152/84 (!) 153/92  Pulse: 85 88 83 82  Resp: $Remo'16 16 16 16  'ULcMZ$ Temp: 98 F (36.7 C) 98.4 F (36.9 C) 98.7 F (37.1 C) 97.7 F (36.5 C)  TempSrc: Oral Oral Oral Oral  SpO2: 99% 100% 100% 100%  Weight:      Height:        General: Pt is alert, awake, not in acute distress Cardiovascular: RRR, S1/S2 +, no rubs, no gallops Respiratory: CTA bilaterally, no wheezing, no rhonchi Abdominal: Soft, NT, ND, bowel sounds + Extremities: Right fourth metatarsal amputations.  Covered in dressing.    The results of significant diagnostics from this hospitalization (including imaging, microbiology, ancillary and laboratory) are listed below for reference.     Microbiology: Recent Results (from the past 240 hour(s))  Culture, blood (routine x 2)     Status: None   Collection Time: 04/21/21  3:32 AM   Specimen: BLOOD  Result Value Ref Range Status   Specimen Description   Final    BLOOD BLOOD LEFT ARM Performed at [facility]  [address] [place], [place] [address]    Special Requests   Final    BOTTLES DRAWN AEROBIC ONLY Blood Culture adequate volume Performed at [facility] [address], [place], [place] [address]    Culture   Final    NO GROWTH 5 DAYS Performed at [facility], [place] [address], [place], [place] [address]    Report Status 04/26/2021 FINAL  Final  Culture, blood (routine x 2)     Status: None   Collection Time: 04/21/21  3:32 AM   Specimen: BLOOD LEFT HAND  Result Value Ref Range Status   Specimen Description   Final    BLOOD LEFT HAND Performed at [facility], [place] [address], [place], [place] [address]    Special Requests   Final    BOTTLES DRAWN AEROBIC ONLY Blood Culture adequate volume Performed at [facility] [address] [place], [place] [address]    Culture   Final    NO GROWTH 5 DAYS Performed at [facility], [place] [address], [place], [place] [address]    Report Status 04/26/2021 FINAL  Final     Labs: BNP (last 3 results) No results for input(s): BNP in the last 8760 hours. Basic Metabolic Panel: Recent Labs  Lab 04/24/21 0321 04/25/21 0450 04/26/21 0440 04/27/21 0417 04/28/21 0413  NA 132* 135 136 136 136  K 4.5 4.3 4.3 4.5 4.1  CL 102 105 110 107 105  CO2 22 22 21* 23 23  GLUCOSE 118* 111* 96 85 84  BUN 33* 36* 30* 27* 27*  CREATININE 3.43* 3.37* 2.74* 2.67* 2.59*  CALCIUM 8.1* 8.5* 8.0* 8.5* 8.5*  MG  --   --   --   --  2.1  PHOS  --   --   --   --  4.0   Liver Function Tests: Recent Labs  Lab 04/26/21 0440  AST 23  ALT 6  ALKPHOS 69  BILITOT 0.3  PROT 6.9  ALBUMIN 2.4*   No results for input(s): LIPASE, AMYLASE in the last 168 hours. No results for input(s): AMMONIA in the last 168 hours. CBC: Recent Labs  Lab 04/22/21 0324 04/24/21 0355 04/26/21 0440 04/27/21 0417 04/28/21 0413  WBC 14.4* 16.8*   16.9* 13.2* 13.2* 11.8*  NEUTROABS  --  11.4*  --   --   --   HGB 8.2* 8.0*   7.9* 8.1* 8.1* 8.5*  HCT 27.3* 25.5*   25.7* 25.8* 26.0* 27.1*  MCV 88.1 88.2   87.4 87.5 87.8 88.0  PLT 343 393   389 380 416* 409*   Cardiac Enzymes: No results for input(s): CKTOTAL, CKMB, CKMBINDEX, TROPONINI in the last 168 hours. BNP: Invalid input(s):  POCBNP CBG: Recent Labs  Lab 04/27/21 1142 04/27/21 1721 04/27/21 2136 04/28/21 0739 04/28/21 1155  GLUCAP 90 102* 77 72 88   D-Dimer No results for input(s): DDIMER in the last 72 hours. Hgb A1c No results for input(s): HGBA1C in the last 72 hours. Lipid Profile No results for input(s): CHOL, HDL, LDLCALC, TRIG, CHOLHDL, LDLDIRECT in the last 72 hours. Thyroid function studies No results for input(s): TSH, T4TOTAL, T3FREE, THYROIDAB in the last 72 hours.  Invalid input(s): FREET3 Anemia work up No results for input(s): VITAMINB12, FOLATE, FERRITIN, TIBC, IRON, RETICCTPCT in the last 72 hours. Urinalysis    Component Value Date/Time   COLORURINE YELLOW 04/25/2021 0100   APPEARANCEUR CLEAR 04/25/2021 0100   LABSPEC 1.013  04/25/2021 0100   PHURINE 5.0 04/25/2021 0100   GLUCOSEU NEGATIVE 04/25/2021 0100   GLUCOSEU >=1000 (A) 10/17/2018 1441   HGBUR MODERATE (A) 04/25/2021 0100   BILIRUBINUR NEGATIVE 04/25/2021 0100   KETONESUR NEGATIVE 04/25/2021 0100   PROTEINUR 100 (A) 04/25/2021 0100   UROBILINOGEN 0.2 10/17/2018 1441   NITRITE NEGATIVE 04/25/2021 0100   LEUKOCYTESUR NEGATIVE 04/25/2021 0100   Sepsis Labs Invalid input(s): PROCALCITONIN,  WBC,  LACTICIDVEN Microbiology Recent Results (from the past 240 hour(s))  Culture, blood (routine x 2)     Status: None   Collection Time: 04/21/21  3:32 AM   Specimen: BLOOD  Result Value Ref Range Status   Specimen Description   Final    BLOOD BLOOD LEFT ARM Performed at [facility] [address] [place], [place] [address]    Special Requests   Final    BOTTLES DRAWN AEROBIC ONLY Blood Culture adequate volume Performed at [facility] [address], [place], [place] [address]    Culture   Final    NO GROWTH 5 DAYS Performed at [facility], [place] [address], [place], [place] [address]    Report Status 04/26/2021 FINAL  Final  Culture, blood (routine x 2)     Status: None    Collection Time: 04/21/21  3:32 AM   Specimen: BLOOD LEFT HAND  Result Value Ref Range Status   Specimen Description   Final    BLOOD LEFT HAND Performed at [facility], [place] [address], [place], [place] [address]    Special Requests   Final    BOTTLES DRAWN AEROBIC ONLY Blood Culture adequate volume Performed at [facility] [address], [place], [place] [address]    Culture   Final    NO GROWTH 5 DAYS Performed at [facility], [place] [address] [place],  [address]    Report Status 04/26/2021 FINAL  Final     Time coordinating discharge: Over 30 minutes  SIGNED:   Shawna Clamp, MD  [facility] 04/28/2021, 1:06 PM Pager   If 7PM-7AM, please contact night-coverage

## 2021-04-28 NOTE — Progress Notes (Signed)
Peripherally Inserted Central Catheter Placement  The IV Nurse has discussed with the patient and/or persons authorized to consent for the patient, the purpose of this procedure and the potential benefits and risks involved with this procedure.  The benefits include less needle sticks, lab draws from the catheter, and the patient may be discharged home with the catheter. Risks include, but not limited to, infection, bleeding, blood clot (thrombus formation), and puncture of an artery; nerve damage and irregular heartbeat and possibility to perform a PICC exchange if needed/ordered by physician.  Alternatives to this procedure were also discussed.  Bard Power PICC patient education guide, fact sheet on infection prevention and patient information card has been provided to patient /or left at bedside.    PICC Placement Documentation  PICC Single Lumen 76/81/15 Right Basilic 40 cm 0 cm (Active)  Indication for Insertion or Continuance of Line Home intravenous therapies (PICC only) 04/28/21 1100  Exposed Catheter (cm) 0 cm 04/28/21 1100  Site Assessment Clean;Dry;Intact 04/28/21 1100  Line Status Flushed;Saline locked;Blood return noted 04/28/21 1100  Dressing Type Transparent;Securing device 04/28/21 1100  Dressing Status Clean;Dry;Intact 04/28/21 1100  Antimicrobial disc in place? Yes 04/28/21 1100  Safety Lock Not Applicable 72/62/03 5597  Line Care Connections checked and tightened 04/28/21 1100  Dressing Intervention New dressing 04/28/21 1100  Dressing Change Due 05/05/21 04/28/21 1100       Holley Bouche Renee 04/28/2021, 11:14 AM

## 2021-04-29 ENCOUNTER — Inpatient Hospital Stay: Payer: 59 | Admitting: Physician Assistant

## 2021-04-29 ENCOUNTER — Inpatient Hospital Stay: Payer: 59 | Attending: Physician Assistant

## 2021-04-29 NOTE — Telephone Encounter (Signed)
Per Dr. Cannon Kettle: They do not have the wound vac he needs on the floor I tried to take care of it on yesterday but I will call the nursing staff to see if they can do it for me  Pt is scheduled on 05/05/2021 with Dr. March Rummage

## 2021-04-30 ENCOUNTER — Telehealth: Payer: Self-pay | Admitting: *Deleted

## 2021-04-30 NOTE — Telephone Encounter (Signed)
Called patient, to see if his wound vac is working, no answer,lt vmessage for call back.

## 2021-04-30 NOTE — Telephone Encounter (Signed)
Can we follow-up with the patient a little later to make sure it's been fixed. Otherwise we will have to have him come in to remove it and change his dressing.

## 2021-04-30 NOTE — Telephone Encounter (Addendum)
Patient is calling because his wound vac (plevena plus 125)stopped working  Returned the call to patient and he said that it stopped last night,called [employer], walked him thru but still not working, instructed him to call them back to explain what happened and will let the physician know.he verbalized understanding and said he would.Please advise.

## 2021-04-30 NOTE — Telephone Encounter (Signed)
Called patient to schedule appointment to come in for dressing change tomorrow on nurse schedule, since he said that his wound vac is still not working, per physician no answer,left voice message to call back.

## 2021-04-30 NOTE — Anesthesia Postprocedure Evaluation (Signed)
Anesthesia Post Note  Patient: [name]  Procedure(s) Performed: TRANSESOPHAGEAL ECHOCARDIOGRAM (TEE)     Patient location during evaluation: Endoscopy Anesthesia Type: MAC Level of consciousness: awake and alert Pain management: pain level controlled Vital Signs Assessment: post-procedure vital signs reviewed and stable Respiratory status: spontaneous breathing, nonlabored ventilation, respiratory function stable and patient connected to nasal cannula oxygen Cardiovascular status: stable and blood pressure returned to baseline Postop Assessment: no apparent nausea or vomiting Anesthetic complications: no   No notable events documented.  Last Vitals:  Vitals:   04/28/21 0541 04/28/21 1156  BP: (!) 152/84 (!) 153/92  Pulse: 83 82  Resp: 16 16  Temp: 37.1 C 36.5 C  SpO2: 100% 100%    Last Pain:  Vitals:   04/28/21 2000  TempSrc:   PainSc: 0-No pain                 Bunnie Lederman

## 2021-04-30 NOTE — Telephone Encounter (Signed)
Thank you - the nurse can just remove the dressing and apply a betadine wet to dry dressing.

## 2021-04-30 NOTE — Telephone Encounter (Signed)
Please schedule this patient for nurse visit per Dr [name] tomorrow or Monday.wound vac not working and he needs to come in for a dressing change.

## 2021-05-01 ENCOUNTER — Ambulatory Visit (INDEPENDENT_AMBULATORY_CARE_PROVIDER_SITE_OTHER): Payer: Medicaid Other

## 2021-05-01 ENCOUNTER — Other Ambulatory Visit: Payer: Self-pay

## 2021-05-01 DIAGNOSIS — L02611 Cutaneous abscess of right foot: Secondary | ICD-10-CM

## 2021-05-01 NOTE — Progress Notes (Signed)
Patient in office today for a dressing change per Dr. March Rummage request. Wound vac hose was in place, removed at this time as advised by Dr. March Rummage. Wet to dry dressing placed over surgical site. Patient denies nausea, vomiting, fever and chills at this time. Advise patient to monitor for signs and symptoms of infection and call the office with any questions, comments or concerns. Patient verbalized understanding.

## 2021-05-05 ENCOUNTER — Ambulatory Visit (INDEPENDENT_AMBULATORY_CARE_PROVIDER_SITE_OTHER): Payer: Medicaid Other | Admitting: Podiatry

## 2021-05-05 ENCOUNTER — Other Ambulatory Visit: Payer: Self-pay

## 2021-05-05 DIAGNOSIS — M8580 Other specified disorders of bone density and structure, unspecified site: Secondary | ICD-10-CM

## 2021-05-05 DIAGNOSIS — M86171 Other acute osteomyelitis, right ankle and foot: Secondary | ICD-10-CM

## 2021-05-05 DIAGNOSIS — Z9889 Other specified postprocedural states: Secondary | ICD-10-CM

## 2021-05-05 DIAGNOSIS — L97514 Non-pressure chronic ulcer of other part of right foot with necrosis of bone: Secondary | ICD-10-CM

## 2021-05-05 MED ORDER — OXYCODONE-ACETAMINOPHEN 5-325 MG PO TABS: 1.0000 | ORAL_TABLET | ORAL | 0 refills | Status: DC | PRN

## 2021-05-05 NOTE — Progress Notes (Signed)
°  Subjective:  Patient ID: [name], male    DOB: 07-Jul-1970,  MRN: [id number]  No chief complaint on file.  DOS: 04/19/21 Procedure: Right foot Chopart Amputation   DOS: 04/22/21 Procedure: Wound debridement and closure, tendoAchilles lengthening  51 y.o. male presents with the above complaint. History confirmed with patient. Having pain, requesting pain rx today. Has been walking in normal shoegear states he was not told he had to use the boot he was given at the hospital.  Objective:  Physical Exam: tenderness at the surgical site, local edema noted, and calf supple, nontender. Incision: healing well, no significant drainage, no dehiscence, no significant erythema. Achilles tendon intact without dell. Active DF/PF of the Achilles noted.   Assessment:   1. Ulcer of right foot with necrosis of bone ([place])   2. Bone erosion   3. Other acute osteomyelitis of right foot ([place])   4. Post-operative state    Plan:  Patient was evaluated and treated and all questions answered.  Post-operative State -Discused importance of compliance with Cam boot and weightbearing precautions -Wound well appearing today. Dressed with povidone and DSD. Keep intact until next visit unless strikethrough noted. -Plan for staple removal posterior ankle next visit, will leave amputation site staples in longer -XRs needed at follow-up: none  Return in about 10 days (around 05/15/2021) for Post-Op (No XRs).

## 2021-05-15 ENCOUNTER — Other Ambulatory Visit: Payer: Self-pay

## 2021-05-15 ENCOUNTER — Ambulatory Visit (INDEPENDENT_AMBULATORY_CARE_PROVIDER_SITE_OTHER): Payer: Medicaid Other | Admitting: Podiatry

## 2021-05-15 DIAGNOSIS — L97514 Non-pressure chronic ulcer of other part of right foot with necrosis of bone: Secondary | ICD-10-CM

## 2021-05-15 DIAGNOSIS — Z9889 Other specified postprocedural states: Secondary | ICD-10-CM

## 2021-05-15 MED ORDER — OXYCODONE-ACETAMINOPHEN 5-325 MG PO TABS
1.0000 | ORAL_TABLET | ORAL | 0 refills | Status: DC | PRN
Start: 2021-05-15 — End: 2021-09-15

## 2021-05-19 NOTE — Progress Notes (Addendum)
EKG Subjective:  Patient ID: [name], male    DOB: January 05, 1971,  MRN: [id number]  Chief Complaint  Patient presents with   Routine Post Op    Ulcer of right foot    DOS: 04/19/21 Procedure: Right foot Chopart Amputation   DOS: 04/22/21 Procedure: Wound debridement and closure, tendoAchilles lengthening  51 y.o. male presents with the above complaint. History confirmed with patient.  He is having some pain would like a pain prescription again.  He has been walking in his normal shoes even though I encouraged him to walk with his boot on.  He states understanding.  Objective:  Physical Exam: tenderness at the surgical site, local edema noted, and calf supple, nontender.  DP PT nonpalpable. Incision: healing well, no significant drainage, no dehiscence, no significant erythema. Achilles tendon intact without dell. Active DF/PF of the Achilles noted.   Assessment:   1. Ulcer of right foot with necrosis of bone ([place])     Plan:  Patient was evaluated and treated and all questions answered.  Post-operative State -Discused importance of compliance with Cam boot and weightbearing precautions -The wound and incision site is healing 80% of the incision has healed.  There is an area on the lateral side of the show part that needs Betadine wet to dry dressing.  I discussed that he needs to apply Betadine wet-to-dry once a day.  He states understanding -We will plan on removing the staples and stitches during next clinical visit. -XRs needed at follow-up: none may get a new set of x-ray during next clinical visit.  No follow-ups on file.

## 2021-05-25 ENCOUNTER — Telehealth: Payer: Self-pay | Admitting: Podiatry

## 2021-05-25 NOTE — Telephone Encounter (Signed)
Pt called stating he was on a wound VAC but Dr. March Rummage took him off of it. He still has the equipment and would like to know how to send it back. Please advise.

## 2021-05-28 ENCOUNTER — Telehealth: Payer: Self-pay

## 2021-05-28 ENCOUNTER — Other Ambulatory Visit: Payer: Self-pay

## 2021-05-28 ENCOUNTER — Ambulatory Visit (INDEPENDENT_AMBULATORY_CARE_PROVIDER_SITE_OTHER): Payer: 59 | Admitting: Internal Medicine

## 2021-05-28 ENCOUNTER — Encounter: Payer: Self-pay | Admitting: Internal Medicine

## 2021-05-28 VITALS — BP 156/90 | HR 82 | Temp 97.7°F | Wt 193.0 lb

## 2021-05-28 DIAGNOSIS — R7881 Bacteremia: Secondary | ICD-10-CM | POA: Diagnosis not present

## 2021-05-28 DIAGNOSIS — E11628 Type 2 diabetes mellitus with other skin complications: Secondary | ICD-10-CM | POA: Diagnosis not present

## 2021-05-28 DIAGNOSIS — I059 Rheumatic mitral valve disease, unspecified: Secondary | ICD-10-CM | POA: Diagnosis not present

## 2021-05-28 DIAGNOSIS — L089 Local infection of the skin and subcutaneous tissue, unspecified: Secondary | ICD-10-CM

## 2021-05-28 DIAGNOSIS — B9561 Methicillin susceptible Staphylococcus aureus infection as the cause of diseases classified elsewhere: Secondary | ICD-10-CM

## 2021-05-28 NOTE — Telephone Encounter (Signed)
Thank you :)

## 2021-05-28 NOTE — Telephone Encounter (Signed)
Per MD called [facility] to give verbal order to pull picc after last dose of IV antibiotics on 3/7. I spoke to the pharmacist Jeani Hawking and he verbalized his understanding. ? ?

## 2021-05-28 NOTE — Progress Notes (Signed)
[name]  Patient Active Problem List   Diagnosis Date Noted   Endocarditis of mitral valve    Severe sepsis ([place]) 04/21/2021   MSSA bacteremia    Type 2 diabetes mellitus with hyperglycemia ([place]) 04/17/2021   Acute kidney injury superimposed on CKD ([place]) 04/17/2021   Mixed hyperlipidemia 04/17/2021   Eustachian tube dysfunction, bilateral 03/18/2021   Ulcer of right foot with necrosis of bone ([place]) 07/22/2020   Anemia in chronic kidney disease (CKD) 07/05/2020   Wound infection 03/25/2020   Gas gangrene of foot ([place])    Type 2 diabetes mellitus with diabetic polyneuropathy, with long-term current use of insulin ([place]) 07/16/2019   Type 2 diabetes mellitus with stage 3a chronic kidney disease, with long-term current use of insulin ([place]) 07/16/2019   Diabetic foot ulcer ([place]) 06/29/2019   Stage 3b chronic kidney disease (CKD) ([place]) 01/24/2019   Obesity 01/24/2019   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[place]) 11/13/2018   Diabetic foot ([place]) 10/17/2018   Non-pressure chronic ulcer of other part of right foot limited to breakdown of skin ([place]) 09/11/2018   Type 2 diabetes mellitus with proliferative retinopathy of both eyes, without long-term current use of insulin ([place]) 07/14/2018   Diabetic infection of left foot ([place]) 07/14/2018   Wound cellulitis 05/22/2018   Osteomyelitis of foot, acute ([place]) 05/21/2018   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 ([place])    Diabetic ulcer of left midfoot associated with diabetes mellitus due to underlying condition, with fat layer exposed ([place]) 08/26/2017   B12 deficiency 06/15/2017   Vitamin D deficiency 06/03/2017   Iron deficiency anemia 06/01/2017   Diabetic foot infection ([place])    Osteomyelitis ([place]) 05/25/2017   Vitreous floaters of right eye 09/15/2016   Non compliance w medication regimen 09/15/2016   Depression 09/01/2016   Foot ulcer due to secondary DM ([place]) 08/20/2016   Hidradenitis  suppurativa 10/13/2015   Peripheral polyneuropathy 07/07/2015   Coronary atherosclerosis of native coronary artery 08/17/2013   Diabetic retinopathy ([place]) 02/02/2013   Diabetic neuropathy ([place]) 12/01/2012   Essential hypertension 08/03/2012   Tobacco use disorder 08/03/2012   Erectile dysfunction 08/03/2012   Diabetes mellitus with neurological manifestations, uncontrolled 05/26/2006      Subjective:    Patient ID: [name], male    DOB: 1970/11/15, 51 y.o.   MRN: [id number]  Chief Complaint  Patient presents with   Follow-up    HPI:  [name] is a 51 y.o. male dm2 chronic right foot diabetic infection here for f/u of hospital admission for mssa bacteremia and ongoing right foot infection  He was admitted 1/20 at podiatry behest for nonhealing/worsening right foot ulcer His blood cx was positive for mssa. He was assymptomatic from sepsis standpoint Tee confirm mv endocarditis He underwent chopart amputation of his right foot (refused bka) He was discharged on cefazolin 6 weeks to finish 3/7  He is doing well No no n/v/diarrhea/rash  Chopart stump healing nicely; still has staples. Seeing his podiatrist     Allergies  Allergen Reactions   Bee Venom Swelling    SWELLING REACTION UNSPECIFIED    Penicillins Hives and Other (See Comments)    Full body hives as a child with no shortness of breath or swelling  **Can tolerate cephalosporins**   Clonidine Derivatives     Pt states "It is making me dizzy"      Outpatient Medications Prior to Visit  Medication Sig Dispense  Refill   Accu-Chek FastClix Lancets MISC 1 Package by Does not apply route as directed. Use as instructed to test blood sugar 2 times daily E11.65 100 each 12   ACETAMINOPHEN EXTRA STRENGTH 500 MG tablet TAKE 2 TABLETS EVERY 6 HOURS AS NEEDED FOR PAIN (Patient taking differently: Take 1,000 mg by mouth every 6 (six) hours as needed for mild pain or moderate pain.) 240 tablet 10    amLODipine (NORVASC) 10 MG tablet Take 1 tablet (10 mg total) by mouth daily. 90 tablet 3   ASPIRIN ADULT LOW STRENGTH 81 MG EC tablet TAKE 1 TABLET BY MOUTH EVERY MORNING (Patient taking differently: Take 81 mg by mouth daily.) 30 tablet 10   atorvastatin (LIPITOR) 20 MG tablet Take 1 tablet (20 mg total) by mouth daily. 90 tablet 1   b complex vitamins tablet Take 1 tablet by mouth daily. 100 tablet 3   B Complex-C (B-COMPLEX WITH VITAMIN C) tablet TAKE 1 TABLET EVERY MORNING (Patient taking differently: Take 1 tablet by mouth daily.) 30 tablet 10   blood glucose meter kit and supplies KIT Dispense based on patient and insurance preference. Use up to four times daily as directed. (FOR ICD-9 250.00, 250.01). 1 each 0   Blood Pressure Monitor DEVI Please provide patient with insurance approved blood pressure monitor. I10.0 1 each 0   carvedilol (COREG) 25 MG tablet Take 1 tablet (25 mg total) by mouth 2 (two) times daily. 180 tablet 1   ceFAZolin (ANCEF) IVPB Inject 2 g into the vein every 8 (eight) hours. Indication:  MSSA IE First Dose: Yes Last Day of Therapy:  06/02/21 Labs - Once weekly:  CBC/D and BMP, Labs - Every other week:  ESR and CRP Method of administration: IV Push Pull PICC at the end of therapy Method of administration may be changed at the discretion of home infusion pharmacist based upon assessment of the patient and/or caregiver's ability to self-administer the medication ordered. 100 Units 0   cloNIDine (CATAPRES) 0.1 MG tablet TAKE 1 TABLET BY MOUTH TWICE  DAILY (Patient taking differently: Take 0.1 mg by mouth 2 (two) times daily.) 180 tablet 3   Continuous Blood Gluc Receiver (FREESTYLE LIBRE 2 READER) DEVI Monitor blood glucose levels 4-5 times per day. ICD10 E11.42 Z79.4 1 each 6   Continuous Blood Gluc Sensor (FREESTYLE LIBRE 2 SENSOR) MISC Monitor blood glucose levels 4-5 times per day. ICD10 E11.42 Z79.4 1 each 6   ferrous sulfate 325 (65 FE) MG EC tablet TAKE 1 TABLET BY  MOUTH EVERY MORNING (Patient taking differently: Take 325 mg by mouth daily with breakfast.) 30 tablet 10   gabapentin (NEURONTIN) 300 MG capsule Take 1 capsule (300 mg total) by mouth 3 (three) times daily. Patient needs office visit before refills will be given (Patient taking differently: Take 300 mg by mouth 3 (three) times daily.) 270 capsule 3   glucose blood test strip Use as instructed to test blood sugar 2 times daily E11.65 100 each 12   insulin glargine (LANTUS SOLOSTAR) 100 UNIT/ML Solostar Pen INJECT SUBCUTANEOUSLY 15 UNITS  TWICE DAILY (Patient taking differently: 15 Units 2 (two) times daily. INJECT SUBCUTANEOUSLY 15 UNITS  TWICE DAILY) 15 mL 1   Insulin Pen Needle 32G X 4 MM MISC Use as instructed. Inject into the skin three time daily. 100 each 0   Misc. Devices MISC Please provide patient with insurance approved diabetic shoes. E11.65 1 each 0   oxyCODONE-acetaminophen (PERCOCET) 5-325 MG tablet Take 1 tablet  by mouth every 4 (four) hours as needed for severe pain. 20 tablet 0   oxyCODONE-acetaminophen (PERCOCET) 5-325 MG tablet Take 1-2 tablets by mouth every 4 (four) hours as needed for severe pain. 30 tablet 0   polyethylene glycol (MIRALAX / GLYCOLAX) 17 g packet Take 17 g by mouth daily as needed for mild constipation or moderate constipation.     senna (SENOKOT) 8.6 MG TABS tablet TAKE 1 TABLET BY MOUTH DAILY AS NEEDED FOR CONSTIPATION (Patient taking differently: Take 1 tablet by mouth daily as needed for mild constipation or moderate constipation.) 30 tablet 10   tamsulosin (FLOMAX) 0.4 MG CAPS capsule Take 1 capsule (0.4 mg total) by mouth daily after supper. 30 capsule 1   triamcinolone cream (KENALOG) 0.1 % Apply 1 application topically 2 (two) times daily. (Patient taking differently: Apply 1 application topically 2 (two) times daily as needed (flare ups).) 80 g 0   TRULICITY 9.52 WU/1.3KG SOPN Inject 3 mg into the skin once a week. Tuesday     No facility-administered  medications prior to visit.     Social History   Socioeconomic History   Marital status: Legally Separated    Spouse name: Not on file   Number of children: Not on file   Years of education: Not on file   Highest education level: Not on file  Occupational History   Occupation: Food service    Employer: [employer]  Tobacco Use   Smoking status: Every Day    Packs/day: 0.50    Years: 25.00    Pack years: 12.50    Types: Cigarettes   Smokeless tobacco: Never  Vaping Use   Vaping Use: Never used  Substance and Sexual Activity   Alcohol use: Not Currently   Drug use: No   Sexual activity: Not Currently  Other Topics Concern   Not on file  Social History Narrative   No longer works in [employer]. On disability. Recently moved in early 2018 from [place]. Previous saw free clinic providers.       Living with and helping take are of his elderly father.   Social Determinants of Health   Financial Resource Strain: Not on file  Food Insecurity: Not on file  Transportation Needs: Not on file  Physical Activity: Not on file  Stress: Not on file  Social Connections: Not on file  Intimate Partner Violence: Not on file      Review of Systems    All other ros negative  Objective:    BP (!) 156/90    Pulse 82    Temp 97.7 F (36.5 C) (Temporal)    Wt 193 lb (87.5 kg)    BMI 33.13 kg/m  Nursing note and vital signs reviewed.  Physical Exam     General/constitutional: no distress, pleasant HEENT: Normocephalic, PER, Conj Clear, EOMI, Oropharynx clear Neck supple CV: rrr no mrg Lungs: clear to auscultation, normal respiratory effort Abd: Soft, Nontender Ext: no edema Skin: No Rash Neuro: nonfocal MSK: right chopart stump dressing clean/dry. Patient to send picture for me. Request dressing not opened  Labs:  2/14 cbc 12/9/273; cr 2.5 (ckd baseline)   Crp: 2/14     8 (<8)  Micro:  Serology:  Imaging:  Assessment & Plan:   Problem List  Items Addressed This Visit       Cardiovascular and Mediastinum   Endocarditis of mitral valve   Relevant Orders   ECHOCARDIOGRAM LIMITED     Endocrine  Diabetic foot infection ([place])     Other   MSSA bacteremia - Primary   Relevant Orders   ECHOCARDIOGRAM LIMITED   Patient admitted 1/20 for chronic right diabetic foot infection, found also to have mssa bacteremia which he was assymptomatic  Tee did show mv vegetation. No other metastatic complication He was discharged on planned 6 weeks iv cefazolin  He also underwent chopart amputation (refused bka). Current tx is for IE, not the foot which is cleared of infection post Chopart amputation  His 6 week cefazolin is to finish on 3/07. Crp on treatment normalized No clinical sign of treatment failure    -ok to remove picc 3/07 -tte ordered for baseline post IE treatment -f/u podiatry -no need to f/u id clinic at this time      Follow-up: Return if symptoms worsen or fail to improve.      Jabier Mutton, [facility] [place] [phone]  pager   [phone] cell 05/28/2021, 10:43 AM

## 2021-05-28 NOTE — Patient Instructions (Signed)
Remove picc on 3/07 by your home health team ? ?I have ordered an echocardiogram for you to do to check a baseline picture of your heart now that you are finished with antibiotic treatment for your endocarditis ? ?No need to follow up with our clinic unless you have fever, new focal joint pain/back pain ?

## 2021-06-03 ENCOUNTER — Ambulatory Visit (INDEPENDENT_AMBULATORY_CARE_PROVIDER_SITE_OTHER): Payer: Medicaid Other | Admitting: Podiatry

## 2021-06-03 ENCOUNTER — Other Ambulatory Visit: Payer: Self-pay

## 2021-06-03 DIAGNOSIS — Z9889 Other specified postprocedural states: Secondary | ICD-10-CM

## 2021-06-03 DIAGNOSIS — L97514 Non-pressure chronic ulcer of other part of right foot with necrosis of bone: Secondary | ICD-10-CM

## 2021-06-04 NOTE — Progress Notes (Signed)
EKG ?Subjective:  ?Patient ID: [name], male    DOB: Feb 20, 1971,  MRN: [id number] ? ?Chief Complaint  ?Patient presents with  ? Foot Ulcer  ?  Right foot   ? ?DOS: 04/19/21 ?Procedure: Right foot Chopart Amputation  ? ?DOS: 04/22/21 ?Procedure: Wound debridement and closure, tendoAchilles lengthening ? ?51 y.o. male presents with the above complaint. History confirmed with patient.  He is having some pain would like a pain prescription again.  He has been walking in his normal shoes even though I encouraged him to walk with his boot on.  He states understanding. ? ?Objective:  ?Physical Exam: tenderness at the surgical site, local edema noted, and calf supple, nontender.  DP PT nonpalpable. ?Incision: healing well except lateral dehiscence on the right foot, no significant drainage, deep dehiscence noted to right lateral foot, no significant erythema. Achilles tendon intact without dell. Active DF/PF of the Achilles noted.  ? ?Assessment:  ? ?1. 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 Post-operative state   ? ? ? ?Plan:  ?Patient was evaluated and treated and all questions answered. ? ?Post-operative State ?-Discused importance of compliance with Cam boot and weightbearing precautions ?-The wound and incision site is healing 80% of the incision has healed.  We will continue Betadine wet-to-dry dressing to the lateral side.  Patient is a high risk of dehiscence and wound complication.  If this does not heal patient is a high risk for below the knee amputation.  I discussed this with the patient he states understanding ?-Removed.  Deep dehiscence noted to the right lateral side.  No probing down to bone.  This is measuring approximately 2 cm x 0.5 cm x 0.3 cm. ?-XRs needed at follow-up: none may get a new set of x-ray during next clinical visit. ? ?No follow-ups on file.  ? ?

## 2021-06-05 ENCOUNTER — Encounter: Payer: Medicaid Other | Admitting: Podiatry

## 2021-06-11 ENCOUNTER — Other Ambulatory Visit: Payer: Self-pay

## 2021-06-11 ENCOUNTER — Ambulatory Visit (HOSPITAL_COMMUNITY)
Admission: RE | Admit: 2021-06-11 | Discharge: 2021-06-11 | Disposition: A | Discharge status: Home / Self Care | Payer: 59 | Source: Ambulatory Visit | Attending: Internal Medicine | Admitting: Internal Medicine

## 2021-06-11 DIAGNOSIS — B9561 Methicillin susceptible Staphylococcus aureus infection as the cause of diseases classified elsewhere: Secondary | ICD-10-CM | POA: Insufficient documentation

## 2021-06-11 DIAGNOSIS — I38 Endocarditis, valve unspecified: Secondary | ICD-10-CM | POA: Insufficient documentation

## 2021-06-11 DIAGNOSIS — I059 Rheumatic mitral valve disease, unspecified: Secondary | ICD-10-CM | POA: Insufficient documentation

## 2021-06-11 DIAGNOSIS — R7881 Bacteremia: Secondary | ICD-10-CM | POA: Diagnosis not present

## 2021-06-11 DIAGNOSIS — I2510 Atherosclerotic heart disease of native coronary artery without angina pectoris: Secondary | ICD-10-CM | POA: Diagnosis not present

## 2021-06-11 DIAGNOSIS — I119 Hypertensive heart disease without heart failure: Secondary | ICD-10-CM | POA: Insufficient documentation

## 2021-06-11 DIAGNOSIS — E119 Type 2 diabetes mellitus without complications: Secondary | ICD-10-CM | POA: Diagnosis not present

## 2021-06-11 LAB — ECHOCARDIOGRAM LIMITED
Area-P 1/2: 3.76 cm2
S' Lateral: 2.55 cm

## 2021-06-15 ENCOUNTER — Telehealth: Payer: Self-pay

## 2021-06-15 NOTE — Telephone Encounter (Signed)
-----   Message from Jabier Mutton, MD sent at 06/12/2021  5:02 PM EDT ----- ?Please let patient know his tte looks good, no evidence of failure of treatment of his bacteremia. ? ?thanks ?

## 2021-06-24 ENCOUNTER — Ambulatory Visit (INDEPENDENT_AMBULATORY_CARE_PROVIDER_SITE_OTHER): Payer: 59 | Admitting: Podiatry

## 2021-06-24 ENCOUNTER — Other Ambulatory Visit: Payer: Self-pay

## 2021-06-24 DIAGNOSIS — Z89439 Acquired absence of unspecified foot: Secondary | ICD-10-CM

## 2021-06-24 NOTE — Progress Notes (Signed)
EKG ?Subjective:  ?Patient ID: [name], male    DOB: [birth year],  MRN: [id number] ? ?Chief Complaint  ?Patient presents with  ? Foot Ulcer  ? ?DOS: 04/19/21 ?Procedure: Right foot Chopart Amputation  ? ?DOS: 04/22/21 ?Procedure: Wound debridement and closure, tendoAchilles lengthening ? ?51 y.o. male presents with the above complaint. History confirmed with patient.  He states he is doing a lot better.  He thinks he may have healed.  Is not any pain there is no drainage.  He has been walking with his boot on. ? ?Objective:  ?Physical Exam: tenderness at the surgical site, local edema noted, and calf supple, nontender.  DP PT nonpalpable. ?Incision: healing well except lateral dehiscence on the right foot, no significant drainage, deep dehiscence noted to right lateral foot, no significant erythema. Achilles tendon intact without dell. Active DF/PF of the Achilles noted.  ? ?Assessment:  ? ?1. 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 ? ? ? ?Plan:  ?Patient was evaluated and treated and all questions answered. ? ?Post-operative State ?-Wound has completely reepithelialized.  At this time I encouraged him to return to his regular shoes.  We will plan on getting him diabetic shoes with space filler for Chopart amputation.  He will be scheduled to see Aaron Edelman for diabetic shoes with filler ? ?No follow-ups on file.  ? ?

## 2021-07-07 NOTE — Progress Notes (Signed)
Erroneous encounter

## 2021-07-14 ENCOUNTER — Encounter: Payer: Self-pay | Admitting: Family

## 2021-07-14 DIAGNOSIS — Z794 Long term (current) use of insulin: Secondary | ICD-10-CM

## 2021-07-27 ENCOUNTER — Other Ambulatory Visit: Payer: Self-pay | Admitting: Family

## 2021-07-27 ENCOUNTER — Telehealth: Payer: Self-pay | Admitting: *Deleted

## 2021-07-27 DIAGNOSIS — I10 Essential (primary) hypertension: Secondary | ICD-10-CM

## 2021-07-27 DIAGNOSIS — E785 Hyperlipidemia, unspecified: Secondary | ICD-10-CM

## 2021-07-27 NOTE — Telephone Encounter (Signed)
Office note from recent visit at [facility] reviewed by Dr Irish Lack.  Dr Irish Lack would like to see patient in 6 months.  I placed call to patient to schedule this appointment.  Left message to call office.  ?

## 2021-07-29 ENCOUNTER — Ambulatory Visit: Payer: Medicare Other

## 2021-07-29 ENCOUNTER — Ambulatory Visit (INDEPENDENT_AMBULATORY_CARE_PROVIDER_SITE_OTHER): Payer: Medicare Other | Admitting: Podiatry

## 2021-07-29 DIAGNOSIS — E1165 Type 2 diabetes mellitus with hyperglycemia: Secondary | ICD-10-CM

## 2021-07-29 DIAGNOSIS — L97522 Non-pressure chronic ulcer of other part of left foot with fat layer exposed: Secondary | ICD-10-CM

## 2021-07-29 DIAGNOSIS — E0843 Diabetes mellitus due to underlying condition with diabetic autonomic (poly)neuropathy: Secondary | ICD-10-CM

## 2021-07-29 DIAGNOSIS — Z89439 Acquired absence of unspecified foot: Secondary | ICD-10-CM

## 2021-07-29 NOTE — Progress Notes (Signed)
SITUATION ?Reason for Consult: Evaluation for Prefabricated Diabetic Shoes and Custom Diabetic Inserts. ?Patient / Caregiver Report: Patient would like well fitting shoes ? ?OBJECTIVE DATA: ?Patient History / Diagnosis:  ?  ICD-10-CM   ?1. Type 2 diabetes mellitus with hyperglycemia, unspecified whether long term insulin use ([name])  E11.65   ?  ?2. History of Chopart amputation [facility])  Z89.439   ?  ? ? ?Physician Treating Diabetes:  Lowella Dell, MD ? ?Current or Previous Devices:   None and no history ? ?In-Person Foot Examination: ?Ulcers & Callousing:   Historical ?Deformities:    Right chopart amputaiton ?Sensation:    Compromised  ?Shoe Size:     10.5W ? ? ?ORTHOTIC RECOMMENDATION ?Recommended Devices: ?- 1x pair prefabricated PDAC approved diabetic shoes; Patient Selected [employer] [place] [address] Black Size 10.5W ?- 3x pair custom-to-patient PDAC approved vacuum formed diabetic insoles. ? ?GOALS OF SHOES AND INSOLES ?- Reduce shear and pressure ?- Reduce / Prevent callus formation ?- Reduce / Prevent ulceration ?- Protect the fragile healing compromised diabetic foot. ? ?Patient would benefit from diabetic shoes and inserts as patient has diabetes mellitus and the patient has one or more of the following conditions: ?- History of partial or complete amputation of the foot ?- History of previous foot ulceration. ?- History of pre-ulcerative callus ?- Peripheral neuropathy with evidence of callus formation ?- Foot deformity ?- Poor circulation ? ?ACTIONS PERFORMED ?Potential out of pocket cost was communicated to patient. Patient understood and consented to measurement and casting. Patient was casted for insoles via crush box and measured for shoes via brannock device. Procedure was explained and patient tolerated procedure well. All questions were answered and concerns addressed. Casts were shipped to central fabrication for HOLD until Certificate of Medical Necessity or otherwise necessary authorization from  insurance is obtained. ? ?PLAN ?Shoes are to be ordered and casts released from hold once all appropriate paperwork is complete. Patient is to be contacted and scheduled for fitting once shoes and insoles have been fabricated and received. ? ?

## 2021-07-29 NOTE — Addendum Note (Signed)
Addended by: Boneta Lucks on: 07/29/2021 04:11 PM ? ? Modules accepted: Orders ? ?

## 2021-08-01 NOTE — Progress Notes (Signed)
Erroneous encounter

## 2021-08-03 NOTE — Telephone Encounter (Signed)
Per DPR it is OK to leave message.  Message left that I was calling to schedule 6 month follow up and to please call office to schedule this appointment ?

## 2021-08-03 NOTE — Telephone Encounter (Signed)
Left message to call office

## 2021-08-03 NOTE — Telephone Encounter (Signed)
Patient returned call

## 2021-08-04 ENCOUNTER — Encounter: Payer: Medicaid Other | Admitting: Family

## 2021-08-04 DIAGNOSIS — E1142 Type 2 diabetes mellitus with diabetic polyneuropathy: Secondary | ICD-10-CM
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
## 2021-08-14 ENCOUNTER — Ambulatory Visit (INDEPENDENT_AMBULATORY_CARE_PROVIDER_SITE_OTHER): Payer: Medicare Other

## 2021-08-14 DIAGNOSIS — Z0000 Encounter for general adult medical examination without abnormal findings: Secondary | ICD-10-CM
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
## 2021-08-17 ENCOUNTER — Encounter: Payer: Self-pay | Admitting: *Deleted

## 2021-08-17 NOTE — Telephone Encounter (Signed)
Letter mailed to patient and sent through my chart asking him to call office to schedule 6 month follow up

## 2021-08-19 ENCOUNTER — Encounter: Payer: Self-pay | Admitting: Podiatry

## 2021-08-19 ENCOUNTER — Ambulatory Visit (INDEPENDENT_AMBULATORY_CARE_PROVIDER_SITE_OTHER): Payer: Medicare Other | Admitting: Podiatry

## 2021-08-19 DIAGNOSIS — E1165 Type 2 diabetes mellitus with hyperglycemia: Secondary | ICD-10-CM | POA: Diagnosis not present

## 2021-08-19 DIAGNOSIS — L97522 Non-pressure chronic ulcer of other part of left foot with fat layer exposed: Secondary | ICD-10-CM | POA: Diagnosis not present
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
## 2021-08-20 ENCOUNTER — Other Ambulatory Visit: Payer: Self-pay | Admitting: Pharmacist

## 2021-08-20 DIAGNOSIS — E1142 Type 2 diabetes mellitus with diabetic polyneuropathy: Secondary | ICD-10-CM

## 2021-08-20 DIAGNOSIS — I10 Essential (primary) hypertension: Secondary | ICD-10-CM

## 2021-08-20 MED ORDER — GABAPENTIN 300 MG PO CAPS: 300.0000 mg | ORAL_CAPSULE | Freq: Three times a day (TID) | ORAL | 0 refills | Status: AC

## 2021-08-20 MED ORDER — AMLODIPINE BESYLATE 10 MG PO TABS: 10.0000 mg | ORAL_TABLET | Freq: Every day | ORAL | 0 refills | Status: DC

## 2021-08-29 ENCOUNTER — Inpatient Hospital Stay (HOSPITAL_COMMUNITY): Payer: Medicare Other

## 2021-08-29 ENCOUNTER — Inpatient Hospital Stay (HOSPITAL_COMMUNITY)
Admission: EM | Admit: 2021-08-29 | Discharge: 2021-09-15 | DRG: 239 | Disposition: A | Discharge status: Skilled Nursing Facility | Payer: Medicare Other | Attending: Internal Medicine | Admitting: Internal Medicine

## 2021-08-29 ENCOUNTER — Encounter (HOSPITAL_COMMUNITY): Payer: Self-pay

## 2021-08-29 ENCOUNTER — Emergency Department (HOSPITAL_COMMUNITY): Payer: Medicare Other

## 2021-08-29 ENCOUNTER — Other Ambulatory Visit: Payer: Self-pay

## 2021-08-29 DIAGNOSIS — Z888 Allergy status to other drugs, medicaments and biological substances status: Secondary | ICD-10-CM

## 2021-08-29 DIAGNOSIS — E8721 Acute metabolic acidosis: Secondary | ICD-10-CM | POA: Diagnosis present

## 2021-08-29 DIAGNOSIS — Y835 Amputation of limb(s) as the cause of abnormal reaction of the patient, or of later complication, without mention of misadventure at the time of the procedure: Secondary | ICD-10-CM | POA: Diagnosis not present

## 2021-08-29 DIAGNOSIS — Z794 Long term (current) use of insulin: Secondary | ICD-10-CM

## 2021-08-29 DIAGNOSIS — L0390 Cellulitis, unspecified: Secondary | ICD-10-CM | POA: Diagnosis not present

## 2021-08-29 DIAGNOSIS — L97529 Non-pressure chronic ulcer of other part of left foot with unspecified severity: Secondary | ICD-10-CM | POA: Diagnosis not present

## 2021-08-29 DIAGNOSIS — E1169 Type 2 diabetes mellitus with other specified complication: Secondary | ICD-10-CM | POA: Diagnosis present

## 2021-08-29 DIAGNOSIS — R7401 Elevation of levels of liver transaminase levels: Secondary | ICD-10-CM | POA: Diagnosis present

## 2021-08-29 DIAGNOSIS — I2510 Atherosclerotic heart disease of native coronary artery without angina pectoris: Secondary | ICD-10-CM | POA: Diagnosis present

## 2021-08-29 DIAGNOSIS — Z6833 Body mass index (BMI) 33.0-33.9, adult: Secondary | ICD-10-CM | POA: Diagnosis not present

## 2021-08-29 DIAGNOSIS — B964 Proteus (mirabilis) (morganii) as the cause of diseases classified elsewhere: Secondary | ICD-10-CM | POA: Diagnosis present

## 2021-08-29 DIAGNOSIS — L02416 Cutaneous abscess of left lower limb: Secondary | ICD-10-CM | POA: Diagnosis present

## 2021-08-29 DIAGNOSIS — E1142 Type 2 diabetes mellitus with diabetic polyneuropathy: Secondary | ICD-10-CM | POA: Diagnosis present

## 2021-08-29 DIAGNOSIS — Y793 Surgical instruments, materials and orthopedic devices (including sutures) associated with adverse incidents: Secondary | ICD-10-CM | POA: Diagnosis not present

## 2021-08-29 DIAGNOSIS — D62 Acute posthemorrhagic anemia: Secondary | ICD-10-CM | POA: Diagnosis not present

## 2021-08-29 DIAGNOSIS — M86272 Subacute osteomyelitis, left ankle and foot: Secondary | ICD-10-CM | POA: Diagnosis not present

## 2021-08-29 DIAGNOSIS — I70262 Atherosclerosis of native arteries of extremities with gangrene, left leg: Secondary | ICD-10-CM | POA: Diagnosis present

## 2021-08-29 DIAGNOSIS — M86179 Other acute osteomyelitis, unspecified ankle and foot: Secondary | ICD-10-CM | POA: Diagnosis present

## 2021-08-29 DIAGNOSIS — Z7985 Long-term (current) use of injectable non-insulin antidiabetic drugs: Secondary | ICD-10-CM

## 2021-08-29 DIAGNOSIS — E1122 Type 2 diabetes mellitus with diabetic chronic kidney disease: Secondary | ICD-10-CM | POA: Diagnosis present

## 2021-08-29 DIAGNOSIS — D72828 Other elevated white blood cell count: Secondary | ICD-10-CM | POA: Diagnosis not present

## 2021-08-29 DIAGNOSIS — N189 Chronic kidney disease, unspecified: Secondary | ICD-10-CM | POA: Diagnosis present

## 2021-08-29 DIAGNOSIS — N170 Acute kidney failure with tubular necrosis: Secondary | ICD-10-CM | POA: Diagnosis present

## 2021-08-29 DIAGNOSIS — E43 Unspecified severe protein-calorie malnutrition: Secondary | ICD-10-CM

## 2021-08-29 DIAGNOSIS — E782 Mixed hyperlipidemia: Secondary | ICD-10-CM | POA: Diagnosis present

## 2021-08-29 DIAGNOSIS — N1832 Chronic kidney disease, stage 3b: Secondary | ICD-10-CM | POA: Diagnosis not present

## 2021-08-29 DIAGNOSIS — N184 Chronic kidney disease, stage 4 (severe): Secondary | ICD-10-CM | POA: Diagnosis present

## 2021-08-29 DIAGNOSIS — E11621 Type 2 diabetes mellitus with foot ulcer: Secondary | ICD-10-CM | POA: Diagnosis present

## 2021-08-29 DIAGNOSIS — F17210 Nicotine dependence, cigarettes, uncomplicated: Secondary | ICD-10-CM | POA: Diagnosis present

## 2021-08-29 DIAGNOSIS — N179 Acute kidney failure, unspecified: Secondary | ICD-10-CM | POA: Diagnosis not present

## 2021-08-29 DIAGNOSIS — D509 Iron deficiency anemia, unspecified: Secondary | ICD-10-CM | POA: Diagnosis present

## 2021-08-29 DIAGNOSIS — D72829 Elevated white blood cell count, unspecified: Secondary | ICD-10-CM | POA: Diagnosis not present

## 2021-08-29 DIAGNOSIS — D631 Anemia in chronic kidney disease: Secondary | ICD-10-CM | POA: Diagnosis present

## 2021-08-29 DIAGNOSIS — M86172 Other acute osteomyelitis, left ankle and foot: Secondary | ICD-10-CM | POA: Diagnosis present

## 2021-08-29 DIAGNOSIS — L97421 Non-pressure chronic ulcer of left heel and midfoot limited to breakdown of skin: Secondary | ICD-10-CM | POA: Diagnosis not present

## 2021-08-29 DIAGNOSIS — B952 Enterococcus as the cause of diseases classified elsewhere: Secondary | ICD-10-CM | POA: Diagnosis present

## 2021-08-29 DIAGNOSIS — I10 Essential (primary) hypertension: Secondary | ICD-10-CM | POA: Diagnosis not present

## 2021-08-29 DIAGNOSIS — E669 Obesity, unspecified: Secondary | ICD-10-CM | POA: Diagnosis present

## 2021-08-29 DIAGNOSIS — L97428 Non-pressure chronic ulcer of left heel and midfoot with other specified severity: Secondary | ICD-10-CM | POA: Diagnosis present

## 2021-08-29 DIAGNOSIS — E1152 Type 2 diabetes mellitus with diabetic peripheral angiopathy with gangrene: Secondary | ICD-10-CM | POA: Diagnosis present

## 2021-08-29 DIAGNOSIS — T879 Unspecified complications of amputation stump: Secondary | ICD-10-CM | POA: Diagnosis not present

## 2021-08-29 DIAGNOSIS — Z79899 Other long term (current) drug therapy: Secondary | ICD-10-CM

## 2021-08-29 DIAGNOSIS — I129 Hypertensive chronic kidney disease with stage 1 through stage 4 chronic kidney disease, or unspecified chronic kidney disease: Secondary | ICD-10-CM | POA: Diagnosis present

## 2021-08-29 DIAGNOSIS — Z833 Family history of diabetes mellitus: Secondary | ICD-10-CM

## 2021-08-29 DIAGNOSIS — Z91030 Bee allergy status: Secondary | ICD-10-CM

## 2021-08-29 DIAGNOSIS — I38 Endocarditis, valve unspecified: Secondary | ICD-10-CM | POA: Diagnosis not present

## 2021-08-29 DIAGNOSIS — D75839 Thrombocytosis, unspecified: Secondary | ICD-10-CM | POA: Diagnosis present

## 2021-08-29 DIAGNOSIS — T8744 Infection of amputation stump, left lower extremity: Secondary | ICD-10-CM | POA: Diagnosis not present

## 2021-08-29 DIAGNOSIS — E1165 Type 2 diabetes mellitus with hyperglycemia: Secondary | ICD-10-CM | POA: Diagnosis present

## 2021-08-29 DIAGNOSIS — I25119 Atherosclerotic heart disease of native coronary artery with unspecified angina pectoris: Secondary | ICD-10-CM | POA: Diagnosis not present

## 2021-08-29 DIAGNOSIS — M869 Osteomyelitis, unspecified: Secondary | ICD-10-CM | POA: Diagnosis not present

## 2021-08-29 DIAGNOSIS — Z8249 Family history of ischemic heart disease and other diseases of the circulatory system: Secondary | ICD-10-CM

## 2021-08-29 DIAGNOSIS — L97429 Non-pressure chronic ulcer of left heel and midfoot with unspecified severity: Secondary | ICD-10-CM | POA: Diagnosis not present

## 2021-08-29 DIAGNOSIS — E118 Type 2 diabetes mellitus with unspecified complications: Secondary | ICD-10-CM

## 2021-08-29 DIAGNOSIS — T8781 Dehiscence of amputation stump: Secondary | ICD-10-CM | POA: Diagnosis not present

## 2021-08-29 DIAGNOSIS — Z89612 Acquired absence of left leg above knee: Secondary | ICD-10-CM | POA: Diagnosis not present

## 2021-08-29 DIAGNOSIS — Z880 Allergy status to penicillin: Secondary | ICD-10-CM

## 2021-08-29 DIAGNOSIS — Z801 Family history of malignant neoplasm of trachea, bronchus and lung: Secondary | ICD-10-CM

## 2021-08-29 DIAGNOSIS — M86672 Other chronic osteomyelitis, left ankle and foot: Secondary | ICD-10-CM | POA: Diagnosis not present

## 2021-08-29 DIAGNOSIS — Z7982 Long term (current) use of aspirin: Secondary | ICD-10-CM

## 2021-08-29 DIAGNOSIS — H409 Unspecified glaucoma: Secondary | ICD-10-CM | POA: Diagnosis present

## 2021-08-29 LAB — CBC WITH DIFFERENTIAL/PLATELET
Abs Immature Granulocytes: 1.81 10*3/uL — ABNORMAL HIGH (ref 0.00–0.07)
Basophils Absolute: 0.2 10*3/uL — ABNORMAL HIGH (ref 0.0–0.1)
Basophils Relative: 0 %
Eosinophils Absolute: 0 10*3/uL (ref 0.0–0.5)
Eosinophils Relative: 0 %
HCT: 33.4 % — ABNORMAL LOW (ref 39.0–52.0)
Hemoglobin: 11.2 g/dL — ABNORMAL LOW (ref 13.0–17.0)
Immature Granulocytes: 4 %
Lymphocytes Relative: 4 %
Lymphs Abs: 1.7 10*3/uL (ref 0.7–4.0)
MCH: 27.9 pg (ref 26.0–34.0)
MCHC: 33.5 g/dL (ref 30.0–36.0)
MCV: 83.3 fL (ref 80.0–100.0)
Monocytes Absolute: 3.2 10*3/uL — ABNORMAL HIGH (ref 0.1–1.0)
Monocytes Relative: 8 %
Neutro Abs: 35.7 10*3/uL — ABNORMAL HIGH (ref 1.7–7.7)
Neutrophils Relative %: 84 %
Platelets: 461 10*3/uL — ABNORMAL HIGH (ref 150–400)
RBC: 4.01 MIL/uL — ABNORMAL LOW (ref 4.22–5.81)
RDW: 15.9 % — ABNORMAL HIGH (ref 11.5–15.5)
WBC: 42.6 10*3/uL — ABNORMAL HIGH (ref 4.0–10.5)
nRBC: 0 % (ref 0.0–0.2)

## 2021-08-29 LAB — GLUCOSE, CAPILLARY: Glucose-Capillary: 176 mg/dL — ABNORMAL HIGH (ref 70–99)

## 2021-08-29 LAB — COMPREHENSIVE METABOLIC PANEL
ALT: 46 U/L — ABNORMAL HIGH (ref 0–44)
AST: 31 U/L (ref 15–41)
Albumin: 2.4 g/dL — ABNORMAL LOW (ref 3.5–5.0)
Alkaline Phosphatase: 110 U/L (ref 38–126)
Anion gap: 14 (ref 5–15)
BUN: 109 mg/dL — ABNORMAL HIGH (ref 6–20)
CO2: 13 mmol/L — ABNORMAL LOW (ref 22–32)
Calcium: 9.1 mg/dL (ref 8.9–10.3)
Chloride: 107 mmol/L (ref 98–111)
Creatinine, Ser: 5.21 mg/dL — ABNORMAL HIGH (ref 0.61–1.24)
GFR, Estimated: 13 mL/min — ABNORMAL LOW (ref >60)
Glucose, Bld: 250 mg/dL — ABNORMAL HIGH (ref 70–99)
Potassium: 4.6 mmol/L (ref 3.5–5.1)
Sodium: 134 mmol/L — ABNORMAL LOW (ref 135–145)
Total Bilirubin: 2.4 mg/dL — ABNORMAL HIGH (ref 0.3–1.2)
Total Protein: 7.6 g/dL (ref 6.5–8.1)

## 2021-08-29 LAB — LACTIC ACID, PLASMA
Lactic Acid, Venous: 1.8 mmol/L (ref 0.5–1.9)
Lactic Acid, Venous: 1.4 mmol/L (ref 0.5–1.9)

## 2021-08-29 LAB — C-REACTIVE PROTEIN: CRP: 32.8 mg/dL — ABNORMAL HIGH (ref <1.0)

## 2021-08-29 LAB — SEDIMENTATION RATE: Sed Rate: 84 mm/hr — ABNORMAL HIGH (ref 0–16)

## 2021-08-29 LAB — PREALBUMIN: Prealbumin: 5.5 mg/dL — ABNORMAL LOW (ref 18–38)

## 2021-08-29 MED ORDER — GABAPENTIN 300 MG PO CAPS
300.0000 mg | ORAL_CAPSULE | Freq: Three times a day (TID) | ORAL | Status: DC
  Administered 2021-08-29 – 2021-09-01 (×8): 300 mg via ORAL
  Filled 2021-08-29 (×8): qty 1

## 2021-08-29 MED ORDER — CHLORHEXIDINE GLUCONATE CLOTH 2 % EX PADS
6.0000 | MEDICATED_PAD | Freq: Once | CUTANEOUS | Status: AC
  Administered 2021-08-30: 6 via TOPICAL

## 2021-08-29 MED ORDER — CARVEDILOL 25 MG PO TABS
25.0000 mg | ORAL_TABLET | Freq: Two times a day (BID) | ORAL | Status: DC
  Administered 2021-08-30 – 2021-09-14 (×31): 25 mg via ORAL
  Filled 2021-08-29 (×32): qty 1

## 2021-08-29 MED ORDER — ACETAMINOPHEN 650 MG RE SUPP: 650.0000 mg | Freq: Four times a day (QID) | RECTAL | Status: DC | PRN

## 2021-08-29 MED ORDER — HYDROCODONE-ACETAMINOPHEN 5-325 MG PO TABS
1.0000 | ORAL_TABLET | Freq: Once | ORAL | Status: AC
Start: 2021-08-29 — End: 2021-08-29
  Administered 2021-08-29: 1 via ORAL
  Filled 2021-08-29: qty 1

## 2021-08-29 MED ORDER — AMLODIPINE BESYLATE 10 MG PO TABS
10.0000 mg | ORAL_TABLET | Freq: Every day | ORAL | Status: DC
  Administered 2021-08-29 – 2021-09-15 (×18): 10 mg via ORAL
  Filled 2021-08-29 (×18): qty 1

## 2021-08-29 MED ORDER — ENOXAPARIN SODIUM 30 MG/0.3ML IJ SOSY
30.0000 mg | PREFILLED_SYRINGE | INTRAMUSCULAR | Status: DC
  Administered 2021-08-29 – 2021-08-30 (×2): 30 mg via SUBCUTANEOUS
  Filled 2021-08-29 (×2): qty 0.3

## 2021-08-29 MED ORDER — VANCOMYCIN VARIABLE DOSE PER UNSTABLE RENAL FUNCTION (PHARMACIST DOSING): Status: DC

## 2021-08-29 MED ORDER — SENNA 8.6 MG PO TABS
1.0000 | ORAL_TABLET | Freq: Every day | ORAL | Status: DC | PRN
  Filled 2021-08-29: qty 1

## 2021-08-29 MED ORDER — INSULIN GLARGINE-YFGN 100 UNIT/ML ~~LOC~~ SOLN
15.0000 [IU] | Freq: Two times a day (BID) | SUBCUTANEOUS | Status: DC
  Administered 2021-08-29 – 2021-09-01 (×7): 15 [IU] via SUBCUTANEOUS
  Filled 2021-08-29 (×10): qty 0.15

## 2021-08-29 MED ORDER — POLYETHYLENE GLYCOL 3350 17 G PO PACK: 17.0000 g | PACK | Freq: Every day | ORAL | Status: DC | PRN

## 2021-08-29 MED ORDER — SODIUM CHLORIDE 0.9 % IV BOLUS
1000.0000 mL | Freq: Once | INTRAVENOUS | Status: AC
  Administered 2021-08-29: 1000 mL via INTRAVENOUS

## 2021-08-29 MED ORDER — ACETAMINOPHEN 325 MG PO TABS
650.0000 mg | ORAL_TABLET | Freq: Four times a day (QID) | ORAL | Status: DC | PRN
  Administered 2021-09-01: 650 mg via ORAL
  Filled 2021-08-29: qty 2

## 2021-08-29 MED ORDER — ONDANSETRON HCL 4 MG PO TABS: 4.0000 mg | ORAL_TABLET | Freq: Four times a day (QID) | ORAL | Status: DC | PRN

## 2021-08-29 MED ORDER — VANCOMYCIN HCL 750 MG/150ML IV SOLN
750.0000 mg | Freq: Once | INTRAVENOUS | Status: DC
  Filled 2021-08-29: qty 150

## 2021-08-29 MED ORDER — OXYCODONE HCL 5 MG PO TABS
5.0000 mg | ORAL_TABLET | ORAL | Status: DC | PRN
  Administered 2021-08-31 – 2021-09-01 (×4): 5 mg via ORAL
  Filled 2021-08-29 (×4): qty 1

## 2021-08-29 MED ORDER — FERROUS SULFATE 325 (65 FE) MG PO TABS
325.0000 mg | ORAL_TABLET | Freq: Every day | ORAL | Status: DC
  Administered 2021-08-30 – 2021-09-14 (×16): 325 mg via ORAL
  Filled 2021-08-29 (×16): qty 1

## 2021-08-29 MED ORDER — LACTATED RINGERS IV SOLN: INTRAVENOUS | Status: DC

## 2021-08-29 MED ORDER — METRONIDAZOLE 500 MG PO TABS
500.0000 mg | ORAL_TABLET | Freq: Two times a day (BID) | ORAL | Status: DC
  Administered 2021-08-29 – 2021-09-02 (×9): 500 mg via ORAL
  Filled 2021-08-29 (×9): qty 1

## 2021-08-29 MED ORDER — INSULIN ASPART 100 UNIT/ML IJ SOLN
0.0000 [IU] | Freq: Three times a day (TID) | INTRAMUSCULAR | Status: DC
  Administered 2021-08-30 – 2021-08-31 (×3): 2 [IU] via SUBCUTANEOUS
  Administered 2021-08-31 (×2): 3 [IU] via SUBCUTANEOUS
  Administered 2021-09-01: 2 [IU] via SUBCUTANEOUS

## 2021-08-29 MED ORDER — VANCOMYCIN HCL IN DEXTROSE 1-5 GM/200ML-% IV SOLN
1000.0000 mg | Freq: Once | INTRAVENOUS | Status: AC
Start: 2021-08-29 — End: 2021-08-29
  Administered 2021-08-29: 1000 mg via INTRAVENOUS
  Filled 2021-08-29: qty 200

## 2021-08-29 MED ORDER — ATORVASTATIN CALCIUM 20 MG PO TABS
20.0000 mg | ORAL_TABLET | Freq: Every day | ORAL | Status: DC
  Administered 2021-08-29 – 2021-08-30 (×2): 20 mg via ORAL
  Filled 2021-08-29 (×2): qty 1

## 2021-08-29 MED ORDER — SODIUM CHLORIDE 0.9 % IV SOLN
2.0000 g | INTRAVENOUS | Status: DC
  Administered 2021-08-29 – 2021-09-01 (×4): 2 g via INTRAVENOUS
  Filled 2021-08-29 (×6): qty 12.5

## 2021-08-29 MED ORDER — CHLORHEXIDINE GLUCONATE CLOTH 2 % EX PADS
6.0000 | MEDICATED_PAD | Freq: Once | CUTANEOUS | Status: AC
Start: 2021-08-29 — End: 2021-08-29
  Administered 2021-08-29: 6 via TOPICAL

## 2021-08-29 MED ORDER — ONDANSETRON HCL 4 MG/2ML IJ SOLN: 4.0000 mg | Freq: Four times a day (QID) | INTRAMUSCULAR | Status: DC | PRN

## 2021-08-29 MED ORDER — ASPIRIN 81 MG PO TBEC
81.0000 mg | DELAYED_RELEASE_TABLET | Freq: Every day | ORAL | Status: DC
  Administered 2021-08-29 – 2021-09-15 (×18): 81 mg via ORAL
  Filled 2021-08-29 (×18): qty 1
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
## 2021-08-29 NOTE — ED Notes (Signed)
Patient has extra blood in the main lab.  3 gold tops , light green, blue, and lavender.

## 2021-08-29 NOTE — ED Triage Notes (Signed)
[facility] reports pt coming from home. Pt states he fell about 0900 today. Pt w/o any complaints from fall but is c/o left foot pain from infection. EMS reports pt is covered in feces and it was all over the walls as well as in the sink.
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
## 2021-08-29 NOTE — Progress Notes (Signed)
Pharmacy Antibiotic Note  [name] is a 51 y.o. male admitted on 08/29/2021 with diabetic foot infection/osteomyelitis. Pharmacy has been consulted for Vancomycin and Cefepime dosing.  Plan: Vancomycin 1g IV x 1 given in the ED. Give an additional '750mg'$  IV x 1 to complete loading dose. Will dose subsequently based on levels given AKI on CKD.  Cefepime 2g IV q24h. Metronidazole adjusted to q12h dosing. Monitor renal function, cultures, clinical course.    Height: '5\' 4"'$  (162.6 cm) Weight: 87.5 kg (193 lb) IBW/kg (Calculated) : 59.2  Temp (24hrs), Avg:97.5 F (36.4 C), Min:97.5 F (36.4 C), Max:97.5 F (36.4 C)  Recent Labs  Lab 08/29/21 1200 08/29/21 1213  WBC 42.6*  --   CREATININE  --  5.21*  LATICACIDVEN  --  1.8    Estimated Creatinine Clearance: 16.7 mL/min (A) (by C-G formula based on SCr of 5.21 mg/dL (H)).    Allergies  Allergen Reactions   Bee Venom Swelling    SWELLING REACTION UNSPECIFIED    Penicillins Hives and Other (See Comments)    Full body hives as a child with no shortness of breath or swelling  **Can tolerate cephalosporins**   Clonidine Derivatives     Pt states "It is making me dizzy"    Antimicrobials this admission: 6/3 Vancomycin >> 6/3 Cefepime >> 6/3 Metronidazole >>  Dose adjustments this admission: --  Microbiology results: 6/3 BCx:  6/3 L foot wound:   Thank you for allowing pharmacy to be a part of this patient's care.   Lindell Spar, PharmD, BCPS Clinical Pharmacist  08/29/2021 3:35 PM

## 2021-08-29 NOTE — Progress Notes (Signed)
Patient arrived to 1412 in NAD, VS stable. Patient oriented to room and call bell in reach.
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
## 2021-08-29 NOTE — ED Notes (Signed)
Vancomycin was started at 1614 and was stopped when MRI came to get pt. Bag was left in the room to be restarted when pt returned but room was cleaned and bag of Vancomycin was thrown away by staff. Pt possibly received about half of the bag of vancomycin.

## 2021-08-29 NOTE — ED Provider Notes (Signed)
[facility] DEPT Provider Note   CSN: [id number] Arrival date & time: 08/29/21  1116     History  Chief Complaint  Patient presents with   [name] is a 51 y.o. male.  Patient complains of pain in the left foot.  He has a history of diabetes and had osteomyelitis in his right foot and had surgery on that in January.  He was sent home on antibiotics for 6 weeks.  He has had an ulcer to his left foot for a number of months and is being seen by podiatry.  He complains of this hurting worse.  And swelling  The history is provided by the patient and medical records. No language interpreter was used.  Fall This is a new problem. The current episode started 6 to 12 hours ago. The problem occurs rarely. The problem has been resolved. Pertinent negatives include no chest pain, no abdominal pain and no headaches. Exacerbated by: Try and walk on left foot. Nothing relieves the symptoms. He has tried nothing for the symptoms. The treatment provided no relief.      Home Medications Prior to Admission medications   Medication Sig Start Date End Date Taking? Authorizing Provider  Accu-Chek FastClix Lancets MISC 1 Package by Does not apply route as directed. Use as instructed to test blood sugar 2 times daily E11.65 09/23/20   Gildardo Pounds, NP  ACETAMINOPHEN EXTRA STRENGTH 500 MG tablet TAKE 2 TABLETS EVERY 6 HOURS AS NEEDED FOR PAIN Patient taking differently: Take 1,000 mg by mouth every 6 (six) hours as needed for mild pain or moderate pain. 01/30/21   Dorna Mai, MD  amLODipine (NORVASC) 10 MG tablet Take 1 tablet (10 mg total) by mouth daily. 08/20/21   Charlott Rakes, MD  ASPIRIN ADULT LOW STRENGTH 81 MG EC tablet TAKE 1 TABLET BY MOUTH EVERY MORNING Patient taking differently: Take 81 mg by mouth daily. 01/30/21   Dorna Mai, MD  atorvastatin (LIPITOR) 20 MG tablet TAKE 1 TABLET BY MOUTH DAILY Patient taking differently: Take 20 mg by  mouth daily. 07/28/21   Camillia Herter, NP  blood glucose meter kit and supplies KIT Dispense based on patient and insurance preference. Use up to four times daily as directed. (FOR ICD-9 250.00, 250.01). 08/25/16   Minus Liberty, MD  Blood Pressure Monitor DEVI Please provide patient with insurance approved blood pressure monitor. I10.0 10/05/19   Gildardo Pounds, NP  carvedilol (COREG) 25 MG tablet TAKE 1 TABLET BY MOUTH TWICE  DAILY Patient taking differently: Take 25 mg by mouth 2 (two) times daily with a meal. 07/28/21   Camillia Herter, NP  cloNIDine (CATAPRES) 0.1 MG tablet TAKE 1 TABLET BY MOUTH TWICE  DAILY Patient taking differently: Take 0.1 mg by mouth 2 (two) times daily. 03/17/21   Charlott Rakes, MD  Continuous Blood Gluc Receiver (FREESTYLE LIBRE 2 READER) DEVI Monitor blood glucose levels 4-5 times per day. ICD10 E11.42 Z79.4 11/04/20   Charlott Rakes, MD  Continuous Blood Gluc Sensor (FREESTYLE LIBRE 2 SENSOR) MISC Monitor blood glucose levels 4-5 times per day. ICD10 E11.42 Z79.4 11/04/20   Charlott Rakes, MD  ferrous sulfate 325 (65 FE) MG EC tablet TAKE 1 TABLET BY MOUTH EVERY MORNING Patient taking differently: Take 325 mg by mouth daily with breakfast. 01/30/21   Dorna Mai, MD  gabapentin (NEURONTIN) 300 MG capsule Take 1 capsule (300 mg total) by mouth 3 (three) times daily. Patient needs  office visit before refills will be given Patient taking differently: Take 300 mg by mouth 3 (three) times daily. 08/20/21   Charlott Rakes, MD  glucose blood test strip Use as instructed to test blood sugar 2 times daily E11.65 09/23/20   Gildardo Pounds, NP  insulin glargine (LANTUS SOLOSTAR) 100 UNIT/ML Solostar Pen INJECT SUBCUTANEOUSLY 15 UNITS  TWICE DAILY Patient taking differently: 15 Units 2 (two) times daily. INJECT SUBCUTANEOUSLY 15 UNITS  TWICE DAILY 04/02/21   Camillia Herter, NP  Insulin Pen Needle 32G X 4 MM MISC Use as instructed. Inject into the skin three time daily.  03/16/21   Camillia Herter, NP  Misc. Devices MISC Please provide patient with insurance approved diabetic shoes. E11.65 01/14/20   Gildardo Pounds, NP  oxyCODONE-acetaminophen (PERCOCET) 5-325 MG tablet Take 1-2 tablets by mouth every 4 (four) hours as needed for severe pain. Patient not taking: Reported on 08/14/2021 05/15/21   Felipa Furnace, DPM  polyethylene glycol (MIRALAX / GLYCOLAX) 17 g packet Take 17 g by mouth daily as needed for mild constipation or moderate constipation.    [provider]  senna (SENOKOT) 8.6 MG TABS tablet TAKE 1 TABLET BY MOUTH DAILY AS NEEDED FOR CONSTIPATION Patient taking differently: Take 1 tablet by mouth daily as needed for mild constipation or moderate constipation. 01/30/21   Dorna Mai, MD  triamcinolone cream (KENALOG) 0.1 % Apply 1 application topically 2 (two) times daily. Patient taking differently: Apply 1 application. topically 2 (two) times daily as needed (flare ups). 03/16/21   Camillia Herter, NP  TRULICITY 3 ZE/0.9QZ SOPN Inject 3 mg into the skin once a week. 06/08/21   [provider]  cetirizine (ZYRTEC) 10 MG tablet Take 1 tablet (10 mg total) by mouth daily. Prn itching Patient not taking: Reported on 03/25/2020 11/01/19 03/25/20  Argentina Donovan, PA-C      Allergies    Bee venom, Penicillins, and Clonidine derivatives    Review of Systems   Review of Systems  Constitutional:  Negative for appetite change and fatigue.  HENT:  Negative for congestion, ear discharge and sinus pressure.   Eyes:  Negative for discharge.  Respiratory:  Negative for cough.   Cardiovascular:  Negative for chest pain.  Gastrointestinal:  Negative for abdominal pain and diarrhea.  Genitourinary:  Negative for frequency and hematuria.  Musculoskeletal:  Negative for back pain.       Pain in left foot with ulcer  Skin:  Negative for rash.  Neurological:  Negative for seizures and headaches.  Psychiatric/Behavioral:  Negative for  hallucinations.    Physical Exam Updated Vital Signs BP 121/82   Pulse 81   Temp (!) 97.5 F (36.4 C) (Oral)   Resp 20   Ht $R'5\' 4"'RO$  (1.626 m)   Wt 87.5 kg   SpO2 100%   BMI 33.13 kg/m  Physical Exam Vitals and nursing note reviewed.  Constitutional:      Appearance: He is well-developed.  HENT:     Head: Normocephalic.     Nose: Nose normal.  Eyes:     General: No scleral icterus.    Conjunctiva/sclera: Conjunctivae normal.  Neck:     Thyroid: No thyromegaly.  Cardiovascular:     Rate and Rhythm: Normal rate and regular rhythm.     Heart sounds: No murmur heard.   No friction rub. No gallop.  Pulmonary:     Breath sounds: No stridor. No wheezing or rales.  Chest:  Chest wall: No tenderness.  Abdominal:     General: There is no distension.     Tenderness: There is no abdominal tenderness. There is no rebound.  Musculoskeletal:        General: Normal range of motion.     Cervical back: Neck supple.     Comments: Partial amputation right foot.  Left foot has an ulcer that is draining pus  Lymphadenopathy:     Cervical: No cervical adenopathy.  Skin:    Findings: No erythema or rash.  Neurological:     Mental Status: He is alert and oriented to person, place, and time.     Motor: No abnormal muscle tone.     Coordination: Coordination normal.  Psychiatric:        Behavior: Behavior normal.    ED Results / Procedures / Treatments   Labs (all labs ordered are listed, but only abnormal results are displayed) Labs Reviewed  CBC WITH DIFFERENTIAL/PLATELET - Abnormal; Notable for the following components:      Result Value   WBC 42.6 (*)    RBC 4.01 (*)    Hemoglobin 11.2 (*)    HCT 33.4 (*)    RDW 15.9 (*)    Platelets 461 (*)    Neutro Abs 35.7 (*)    Monocytes Absolute 3.2 (*)    Basophils Absolute 0.2 (*)    Abs Immature Granulocytes 1.81 (*)    All other components within normal limits  COMPREHENSIVE METABOLIC PANEL - Abnormal; Notable for the  following components:   Sodium 134 (*)    CO2 13 (*)    Glucose, Bld 250 (*)    BUN 109 (*)    Creatinine, Ser 5.21 (*)    Albumin 2.4 (*)    ALT 46 (*)    Total Bilirubin 2.4 (*)    GFR, Estimated 13 (*)    All other components within normal limits  AEROBIC/ANAEROBIC CULTURE W GRAM STAIN (SURGICAL/DEEP WOUND)  CULTURE, BLOOD (ROUTINE X 2)  CULTURE, BLOOD (ROUTINE X 2)  LACTIC ACID, PLASMA  LACTIC ACID, PLASMA    EKG None  Radiology DG Foot Complete Left  Result Date: 08/29/2021 CLINICAL DATA:  LEFT foot pain and infection. History of prior osteomyelitis. Diabetes. EXAM: LEFT FOOT - COMPLETE 3+ VIEW COMPARISON:  04/13/2021 MR, 07/22/2020 radiograph and prior studies FINDINGS: Cortical disruption and irregularity at the base of the 5th metatarsal is noted compatible with fracture/osteomyelitis. Osteolysis of the LATERAL aspect of the cuboid again noted compatible with abscess/osteomyelitis as identified on prior MR. Soft tissue swelling along the foot identified with soft tissue defect along the plantar aspect. Juxta-articular sclerosis and moderate-severe chronic joint changes/subluxation at the 1st, 2nd and 3rd tarsometatarsal joints again noted. Severe degenerative changes at the 2nd MTP joint noted and remote metatarsal fractures again identified. 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 Moderate plantar calcaneal spurring vascular calcifications are again identified. IMPRESSION: 1. Cortical disruption and irregularity at the base of the 5th metatarsal compatible with fracture/osteomyelitis. 2. Osteolysis of the LATERAL aspect of the cuboid again noted compatible with abscess/osteomyelitis as identified on prior MR. 3. Soft tissue swelling with plantar soft tissue defect. 4. Chronic changes involving the 1st, 2nd and 3rd tarsometatarsal joints. Electronically Signed   By: Margarette Canada M.D.   On: 08/29/2021 12:56    Procedures Procedures    Medications Ordered in ED Medications   sodium chloride 0.9 % bolus 1,000 mL (0 mLs Intravenous Stopped 08/29/21 1334)  vancomycin (VANCOCIN)  IVPB 1000 mg/200 mL premix (1,000 mg Intravenous New Bag/Given 08/29/21 1232)  sodium chloride 0.9 % bolus 1,000 mL (1,000 mLs Intravenous New Bag/Given 08/29/21 1343)  sodium chloride 0.9 % bolus 1,000 mL (1,000 mLs Intravenous New Bag/Given 08/29/21 1343)  HYDROcodone-acetaminophen (NORCO/VICODIN) 5-325 MG per tablet 1 tablet (1 tablet Oral Given 08/29/21 1346)    ED Course/ Medical Decision Making/ A&P  CRITICAL CARE Performed by: [name] Ferguson Total critical care time: 45 minutes Critical care time was exclusive of separately billable procedures and treating other patients. Critical care was necessary to treat or prevent imminent or life-threatening deterioration. Critical care was time spent personally by me on the following activities: development of treatment plan with patient and/or surrogate as well as nursing, discussions with consultants, evaluation of patient's response to treatment, examination of patient, obtaining history from patient or surrogate, ordering and performing treatments and interventions, ordering and review of laboratory studies, ordering and review of radiographic studies, pulse oximetry and re-evaluation of patient's condition.   Podiatry has been notified and they will see the patient                         Medical Decision Making Amount and/or Complexity of Data Reviewed Labs: ordered. Radiology: ordered.  Risk Prescription drug management. Decision regarding hospitalization.  This patient presents to the ED for concern of painful left foot, this involves an extensive number of treatment options, and is a complaint that carries with it a high risk of complications and morbidity.  The differential diagnosis includes fractured left foot, osteomyelitis   Co morbidities that complicate the patient evaluation  Diabetes   Additional history  obtained:  Additional history obtained from family External records from outside source obtained and reviewed including hospital record   Lab Tests:  I Ordered, and personally interpreted labs.  The pertinent results include: Glucose 250, white count 42,000   Imaging Studies ordered:  I ordered imaging studies including left foot shows osteomyelitis I independently visualized and interpreted imaging which showed osteomyelitis I agree with the radiologist interpretation   Cardiac Monitoring: / EKG:  The patient was maintained on a cardiac monitor.  I personally viewed and interpreted the cardiac monitored which showed an underlying rhythm of: Normal sinus rhythm   Consultations Obtained:  I requested consultation with the podiatrist and hospitalist,  and discussed lab and imaging findings as well as pertinent plan - they recommend: Admit to medicine   Problem List / ED Course / Critical interventions / Medication management  Diabetes and left foot osteomyelitis I ordered medication including vancomycin for infection Reevaluation of the patient after these medicines showed that the patient stayed the same I have reviewed the patients home medicines and have made adjustments as needed   Social Determinants of Health:  None   Test / Admission - Considered:  MRI of the foot  Patient with osteomyelitis to left foot along with significant AKI.  He is admitted to medicine and podiatry consult        Final Clinical Impression(s) / ED Diagnoses Final diagnoses:  Osteomyelitis of fifth toe of left foot [facility])    Rx / DC Orders ED Discharge Orders     None         [name] Ferguson, MD 08/29/21 1622

## 2021-08-29 NOTE — ED Notes (Signed)
Su Hilt: [phone] (sister)

## 2021-08-30 ENCOUNTER — Inpatient Hospital Stay (HOSPITAL_COMMUNITY): Payer: Medicare Other | Admitting: Anesthesiology

## 2021-08-30 ENCOUNTER — Encounter (HOSPITAL_COMMUNITY): Payer: Self-pay | Admitting: Internal Medicine

## 2021-08-30 ENCOUNTER — Other Ambulatory Visit: Payer: Self-pay

## 2021-08-30 ENCOUNTER — Encounter (HOSPITAL_COMMUNITY)
Admission: EM | Disposition: A | Discharge status: Skilled Nursing Facility | Payer: Self-pay | Source: Home / Self Care | Attending: Internal Medicine

## 2021-08-30 ENCOUNTER — Encounter (HOSPITAL_COMMUNITY): Payer: Medicare Other

## 2021-08-30 DIAGNOSIS — Z794 Long term (current) use of insulin: Secondary | ICD-10-CM

## 2021-08-30 DIAGNOSIS — E11621 Type 2 diabetes mellitus with foot ulcer: Secondary | ICD-10-CM | POA: Diagnosis not present

## 2021-08-30 DIAGNOSIS — F17210 Nicotine dependence, cigarettes, uncomplicated: Secondary | ICD-10-CM

## 2021-08-30 DIAGNOSIS — I2510 Atherosclerotic heart disease of native coronary artery without angina pectoris: Secondary | ICD-10-CM

## 2021-08-30 DIAGNOSIS — D631 Anemia in chronic kidney disease: Secondary | ICD-10-CM

## 2021-08-30 DIAGNOSIS — N184 Chronic kidney disease, stage 4 (severe): Secondary | ICD-10-CM

## 2021-08-30 DIAGNOSIS — I25119 Atherosclerotic heart disease of native coronary artery with unspecified angina pectoris: Secondary | ICD-10-CM | POA: Diagnosis not present

## 2021-08-30 DIAGNOSIS — N179 Acute kidney failure, unspecified: Secondary | ICD-10-CM | POA: Diagnosis not present

## 2021-08-30 DIAGNOSIS — M86172 Other acute osteomyelitis, left ankle and foot: Secondary | ICD-10-CM

## 2021-08-30 DIAGNOSIS — I10 Essential (primary) hypertension: Secondary | ICD-10-CM

## 2021-08-30 DIAGNOSIS — M86672 Other chronic osteomyelitis, left ankle and foot: Secondary | ICD-10-CM | POA: Diagnosis not present

## 2021-08-30 HISTORY — PX: IRRIGATION AND DEBRIDEMENT ABSCESS: SHX5252

## 2021-08-30 LAB — CBC
HCT: 29.7 % — ABNORMAL LOW (ref 39.0–52.0)
Hemoglobin: 9.9 g/dL — ABNORMAL LOW (ref 13.0–17.0)
MCH: 27.2 pg (ref 26.0–34.0)
MCHC: 33.3 g/dL (ref 30.0–36.0)
MCV: 81.6 fL (ref 80.0–100.0)
Platelets: 435 10*3/uL — ABNORMAL HIGH (ref 150–400)
RBC: 3.64 MIL/uL — ABNORMAL LOW (ref 4.22–5.81)
RDW: 14.7 % (ref 11.5–15.5)
WBC: 39.8 10*3/uL — ABNORMAL HIGH (ref 4.0–10.5)
nRBC: 0 % (ref 0.0–0.2)

## 2021-08-30 LAB — COMPREHENSIVE METABOLIC PANEL
ALT: 30 U/L (ref 0–44)
AST: 22 U/L (ref 15–41)
Albumin: 2 g/dL — ABNORMAL LOW (ref 3.5–5.0)
Alkaline Phosphatase: 95 U/L (ref 38–126)
Anion gap: 11 (ref 5–15)
BUN: 103 mg/dL — ABNORMAL HIGH (ref 6–20)
CO2: 15 mmol/L — ABNORMAL LOW (ref 22–32)
Calcium: 8.4 mg/dL — ABNORMAL LOW (ref 8.9–10.3)
Chloride: 113 mmol/L — ABNORMAL HIGH (ref 98–111)
Creatinine, Ser: 4.23 mg/dL — ABNORMAL HIGH (ref 0.61–1.24)
GFR, Estimated: 16 mL/min — ABNORMAL LOW (ref >60)
Glucose, Bld: 149 mg/dL — ABNORMAL HIGH (ref 70–99)
Potassium: 4.1 mmol/L (ref 3.5–5.1)
Sodium: 139 mmol/L (ref 135–145)
Total Bilirubin: 2.2 mg/dL — ABNORMAL HIGH (ref 0.3–1.2)
Total Protein: 6.4 g/dL — ABNORMAL LOW (ref 6.5–8.1)

## 2021-08-30 LAB — TYPE AND SCREEN
ABO/RH(D): O POS
Antibody Screen: NEGATIVE

## 2021-08-30 LAB — GLUCOSE, CAPILLARY
Glucose-Capillary: 164 mg/dL — ABNORMAL HIGH (ref 70–99)
Glucose-Capillary: 189 mg/dL — ABNORMAL HIGH (ref 70–99)
Glucose-Capillary: 174 mg/dL — ABNORMAL HIGH (ref 70–99)
Glucose-Capillary: 186 mg/dL — ABNORMAL HIGH (ref 70–99)
Glucose-Capillary: 202 mg/dL — ABNORMAL HIGH (ref 70–99)

## 2021-08-30 LAB — HEMOGLOBIN A1C
Hgb A1c MFr Bld: 7.5 % — ABNORMAL HIGH (ref 4.8–5.6)
Mean Plasma Glucose: 168.55 mg/dL

## 2021-08-30 SURGERY — IRRIGATION AND DEBRIDEMENT ABSCESS
Anesthesia: General | Site: Foot | Laterality: Left

## 2021-08-30 MED ORDER — LIDOCAINE HCL (PF) 1 % IJ SOLN
INTRAMUSCULAR | Status: DC | PRN
  Administered 2021-08-30: 10 mL

## 2021-08-30 MED ORDER — ROCURONIUM BROMIDE 10 MG/ML (PF) SYRINGE
PREFILLED_SYRINGE | INTRAVENOUS | Status: AC
Start: 2021-08-30 — End: ?
  Filled 2021-08-30: qty 10

## 2021-08-30 MED ORDER — BUPIVACAINE HCL 0.25 % IJ SOLN
INTRAMUSCULAR | Status: DC | PRN
  Administered 2021-08-30: 10 mL

## 2021-08-30 MED ORDER — DAKINS (1/4 STRENGTH) 0.125 % EX SOLN
Freq: Once | CUTANEOUS | Status: DC
  Filled 2021-08-30: qty 473

## 2021-08-30 MED ORDER — PROMETHAZINE HCL 25 MG/ML IJ SOLN: 6.2500 mg | INTRAMUSCULAR | Status: DC | PRN

## 2021-08-30 MED ORDER — DEXAMETHASONE SODIUM PHOSPHATE 10 MG/ML IJ SOLN
INTRAMUSCULAR | Status: AC
Start: 2021-08-30 — End: ?
  Filled 2021-08-30: qty 1

## 2021-08-30 MED ORDER — SODIUM CHLORIDE 0.9 % IR SOLN
Status: DC | PRN
  Administered 2021-08-30: 3000 mL

## 2021-08-30 MED ORDER — DEXAMETHASONE SODIUM PHOSPHATE 10 MG/ML IJ SOLN
INTRAMUSCULAR | Status: DC | PRN
  Administered 2021-08-30: 10 mg via INTRAVENOUS

## 2021-08-30 MED ORDER — FENTANYL CITRATE PF 50 MCG/ML IJ SOSY: 25.0000 ug | PREFILLED_SYRINGE | INTRAMUSCULAR | Status: DC | PRN

## 2021-08-30 MED ORDER — MIDAZOLAM HCL 2 MG/2ML IJ SOLN
INTRAMUSCULAR | Status: AC
  Filled 2021-08-30: qty 2

## 2021-08-30 MED ORDER — FENTANYL CITRATE (PF) 100 MCG/2ML IJ SOLN
INTRAMUSCULAR | Status: DC | PRN
  Administered 2021-08-30 (×2): 25 ug via INTRAVENOUS
  Administered 2021-08-30: 50 ug via INTRAVENOUS

## 2021-08-30 MED ORDER — FENTANYL CITRATE (PF) 100 MCG/2ML IJ SOLN
INTRAMUSCULAR | Status: AC
  Filled 2021-08-30: qty 2

## 2021-08-30 MED ORDER — LIDOCAINE HCL (PF) 1 % IJ SOLN
INTRAMUSCULAR | Status: AC
  Filled 2021-08-30: qty 30

## 2021-08-30 MED ORDER — PROPOFOL 500 MG/50ML IV EMUL
INTRAVENOUS | Status: AC
  Filled 2021-08-30: qty 50

## 2021-08-30 MED ORDER — ONDANSETRON HCL 4 MG/2ML IJ SOLN
INTRAMUSCULAR | Status: AC
  Filled 2021-08-30: qty 2

## 2021-08-30 MED ORDER — ONDANSETRON HCL 4 MG/2ML IJ SOLN
INTRAMUSCULAR | Status: DC | PRN
  Administered 2021-08-30: 4 mg via INTRAVENOUS

## 2021-08-30 MED ORDER — BUPIVACAINE HCL (PF) 0.25 % IJ SOLN
INTRAMUSCULAR | Status: AC
Start: 2021-08-30 — End: ?
  Filled 2021-08-30: qty 20

## 2021-08-30 MED ORDER — DAKINS (1/4 STRENGTH) 0.125 % EX SOLN
Freq: Once | CUTANEOUS | Status: AC
  Administered 2021-08-30: 1
  Filled 2021-08-30: qty 473

## 2021-08-30 MED ORDER — LIDOCAINE 2% (20 MG/ML) 5 ML SYRINGE
INTRAMUSCULAR | Status: DC | PRN
  Administered 2021-08-30: 60 mg via INTRAVENOUS

## 2021-08-30 MED ORDER — PHENYLEPHRINE 80 MCG/ML (10ML) SYRINGE FOR IV PUSH (FOR BLOOD PRESSURE SUPPORT)
PREFILLED_SYRINGE | INTRAVENOUS | Status: DC | PRN
  Administered 2021-08-30: 160 ug via INTRAVENOUS
  Administered 2021-08-30: 240 ug via INTRAVENOUS
  Administered 2021-08-30: 160 ug via INTRAVENOUS
  Administered 2021-08-30: 80 ug via INTRAVENOUS

## 2021-08-30 MED ORDER — SUGAMMADEX SODIUM 500 MG/5ML IV SOLN
INTRAVENOUS | Status: AC
  Filled 2021-08-30: qty 5

## 2021-08-30 MED ORDER — PROPOFOL 10 MG/ML IV BOLUS
INTRAVENOUS | Status: DC | PRN
  Administered 2021-08-30: 150 mg via INTRAVENOUS

## 2021-08-30 MED ORDER — SUCCINYLCHOLINE CHLORIDE 200 MG/10ML IV SOSY
PREFILLED_SYRINGE | INTRAVENOUS | Status: AC
  Filled 2021-08-30: qty 10

## 2021-08-30 SURGICAL SUPPLY — 56 items
APL PRP STRL LF DISP 70% ISPRP (MISCELLANEOUS) ×1
BLADE SURG 15 STRL LF DISP TIS (BLADE) ×2 IMPLANT
BLADE SURG 15 STRL SS (BLADE) ×2
BNDG CMPR 9X4 STRL LF SNTH (GAUZE/BANDAGES/DRESSINGS) ×1
BNDG COHESIVE 4X5 TAN ST LF (GAUZE/BANDAGES/DRESSINGS) ×1 IMPLANT
BNDG ELASTIC 3X5.8 VLCR STR LF (GAUZE/BANDAGES/DRESSINGS) ×3 IMPLANT
BNDG ELASTIC 4X5.8 VLCR STR LF (GAUZE/BANDAGES/DRESSINGS) ×3 IMPLANT
BNDG ESMARK 4X9 LF (GAUZE/BANDAGES/DRESSINGS) ×3 IMPLANT
BNDG GAUZE ELAST 4 BULKY (GAUZE/BANDAGES/DRESSINGS) ×3 IMPLANT
CHLORAPREP W/TINT 26 (MISCELLANEOUS) ×3 IMPLANT
CNTNR URN SCR LID CUP LEK RST (MISCELLANEOUS) ×2 IMPLANT
CONT SPEC 4OZ STRL OR WHT (MISCELLANEOUS) ×2
COVER BACK TABLE 60X90IN (DRAPES) ×3 IMPLANT
CUFF TOURN SGL QUICK 18X4 (TOURNIQUET CUFF) ×3 IMPLANT
CUFF TOURN SGL QUICK 24 (TOURNIQUET CUFF) ×2
CUFF TRNQT CYL 24X4X16.5-23 (TOURNIQUET CUFF) ×2 IMPLANT
DRAPE 3/4 80X56 (DRAPES) ×3 IMPLANT
DRAPE EXTREMITY T 121X128X90 (DISPOSABLE) ×3 IMPLANT
DRAPE SHEET LG 3/4 BI-LAMINATE (DRAPES) ×3 IMPLANT
DRAPE U-SHAPE 47X51 STRL (DRAPES) ×3 IMPLANT
ELECT REM PT RETURN 15FT ADLT (MISCELLANEOUS) ×3 IMPLANT
GAUZE SPONGE 4X4 12PLY STRL (GAUZE/BANDAGES/DRESSINGS) ×3 IMPLANT
GAUZE XEROFORM 1X8 LF (GAUZE/BANDAGES/DRESSINGS) ×3 IMPLANT
GLOVE BIO SURGEON STRL SZ7.5 (GLOVE) ×3 IMPLANT
GLOVE BIOGEL PI IND STRL 8 (GLOVE) ×2 IMPLANT
GLOVE BIOGEL PI INDICATOR 8 (GLOVE) ×1
GLOVE ECLIPSE 8.0 STRL XLNG CF (GLOVE) ×3 IMPLANT
GOWN STRL REUS W/ TWL XL LVL3 (GOWN DISPOSABLE) ×2 IMPLANT
GOWN STRL REUS W/TWL XL LVL3 (GOWN DISPOSABLE) ×2
KIT BASIN OR (CUSTOM PROCEDURE TRAY) ×3 IMPLANT
KIT TURNOVER KIT A (KITS) IMPLANT
MANIFOLD NEPTUNE II (INSTRUMENTS) ×3 IMPLANT
NDL HYPO 25X1 1.5 SAFETY (NEEDLE) ×2 IMPLANT
NEEDLE HYPO 25X1 1.5 SAFETY (NEEDLE) ×2 IMPLANT
NS IRRIG 1000ML POUR BTL (IV SOLUTION) ×3 IMPLANT
PADDING CAST ABS 4INX4YD NS (CAST SUPPLIES) ×1
PADDING CAST ABS COTTON 4X4 ST (CAST SUPPLIES) ×2 IMPLANT
PENCIL SMOKE EVACUATOR (MISCELLANEOUS) IMPLANT
SET IRRIG Y TYPE TUR BLADDER L (SET/KITS/TRAYS/PACK) ×3 IMPLANT
SPONGE T-LAP 4X18 ~~LOC~~+RFID (SPONGE) ×3 IMPLANT
STAPLER VISISTAT 35W (STAPLE) ×3 IMPLANT
STOCKINETTE 6  STRL (DRAPES) ×2
STOCKINETTE 6 STRL (DRAPES) ×2 IMPLANT
SUCTION FRAZIER HANDLE 10FR (MISCELLANEOUS) ×2
SUCTION TUBE FRAZIER 10FR DISP (MISCELLANEOUS) ×2 IMPLANT
SUT ETHILON 3 0 PS 1 (SUTURE) ×3 IMPLANT
SUT ETHILON 4 0 PS 2 18 (SUTURE) ×3 IMPLANT
SUT MNCRL AB 3-0 PS2 18 (SUTURE) ×3 IMPLANT
SUT MNCRL AB 4-0 PS2 18 (SUTURE) ×3 IMPLANT
SUT VIC AB 2-0 SH 27 (SUTURE) ×2
SUT VIC AB 2-0 SH 27XBRD (SUTURE) ×2 IMPLANT
SYR BULB EAR ULCER 3OZ GRN STR (SYRINGE) ×3 IMPLANT
SYR CONTROL 10ML LL (SYRINGE) ×3 IMPLANT
TUBE IRRIGATION SET MISONIX (TUBING) ×3 IMPLANT
UNDERPAD 30X36 HEAVY ABSORB (UNDERPADS AND DIAPERS) ×3 IMPLANT
YANKAUER SUCT BULB TIP NO VENT (SUCTIONS) ×3 IMPLANT

## 2021-08-30 NOTE — Progress Notes (Signed)
Patient transported in bed to PACU in bed by this RN.  Hand off report given to RN in the PACU.

## 2021-08-30 NOTE — Anesthesia Postprocedure Evaluation (Signed)
Anesthesia Post Note  Patient: [name]  Procedure(s) Performed: IRRIGATION AND DEBRIDEMENT LEFT FOOT; BONE BIOPSY (Left: Foot)     Patient location during evaluation: PACU Anesthesia Type: General Level of consciousness: awake and alert Pain management: pain level controlled Vital Signs Assessment: post-procedure vital signs reviewed and stable Respiratory status: spontaneous breathing, nonlabored ventilation, respiratory function stable and patient connected to nasal cannula oxygen Cardiovascular status: blood pressure returned to baseline and stable Postop Assessment: no apparent nausea or vomiting Anesthetic complications: no   No notable events documented.  Last Vitals:  Vitals:   08/30/21 1041 08/30/21 1410  BP: 133/72 (!) 141/77  Pulse: 79 81  Resp: 16 (!) 22  Temp: 37.1 C 36.7 C  SpO2: 96% 99%    Last Pain:  Vitals:   08/30/21 1410  TempSrc: Oral  PainSc:                  Tiajuana Amass

## 2021-08-30 NOTE — Transfer of Care (Signed)
Immediate Anesthesia Transfer of Care Note  Patient: [name]  Procedure(s) Performed: IRRIGATION AND DEBRIDEMENT LEFT FOOT; BONE BIOPSY (Left: Foot)  Patient Location: PACU  Anesthesia Type:General  Level of Consciousness: sedated, patient cooperative and responds to stimulation  Airway & Oxygen Therapy: Patient Spontanous Breathing and Patient connected to face mask oxygen  Post-op Assessment: Report given to RN, Post -op Vital signs reviewed and stable and Patient moving all extremities  Post vital signs: Reviewed and stable  Last Vitals:  Vitals Value Taken Time  BP 111/65 08/30/21 0943  Temp    Pulse    Resp 24 08/30/21 0945  SpO2    Vitals shown include unvalidated device data.  Last Pain:  Vitals:   08/29/21 2155  TempSrc:   PainSc: 0-No pain         Complications: No notable events documented.

## 2021-08-30 NOTE — Anesthesia Preprocedure Evaluation (Addendum)
Anesthesia Evaluation  Patient identified by MRN, date of birth, ID band Patient awake    Reviewed: Allergy & Precautions, NPO status , Patient's Chart, lab work & pertinent test results  History of Anesthesia Complications Negative for: history of anesthetic complications  Airway Mallampati: III  TM Distance: >3 FB     Dental  (+) Dental Advisory Given, Teeth Intact,    Pulmonary Current Smoker and Patient abstained from smoking.,    breath sounds clear to auscultation       Cardiovascular hypertension, Pt. on medications (-) angina+ CAD  (-) CHF  Rhythm:Regular Rate:Normal  1. Left ventricular ejection fraction, by estimation, is 60 to 65%. The left ventricle has normal function. The left ventricle has no regional wall motion abnormalities. Left ventricular diastolic parameters were normal.  2. Right ventricular systolic function is normal. The right ventricular size is normal.  3. Trivial effuson persists since 03/31/21 not circumferential. The pericardial effusion is posterior to the left ventricle.  4. The mitral valve is abnormal. Trivial mitral valve regurgitation. No evidence of mitral stenosis.  5. The aortic valve was not well visualized. There is mild calcification of the aortic valve. There is mild thickening of the aortic valve. Aortic valve regurgitation is not visualized. Aortic valve sclerosis is present,  with no evidence of aortic valve stenosis.  6. The inferior vena cava is normal in size with greater than 50% respiratory variability, suggesting right atrial pressure of 3 mmHg.   ? Nuclear stress EF: 55%. ? There was no ST segment deviation noted during stress. ? The study is normal. ? The left ventricular ejection fraction is normal (55-65%). ? This is a low risk study.   Gwyndolyn Kaufman, MD     Neuro/Psych PSYCHIATRIC DISORDERS Depression  Neuromuscular disease    GI/Hepatic negative GI ROS, Neg  liver ROS,   Endo/Other  diabetes, Type 2, Insulin Dependent  Renal/GU Renal Insufficiency, ARF and CRFRenal disease     Musculoskeletal Osteo right foot with bacteremia   Abdominal   Peds  Hematology  (+) Blood dyscrasia, anemia ,   Anesthesia Other Findings   Reproductive/Obstetrics                           Anesthesia Physical  Anesthesia Plan  ASA: 3  Anesthesia Plan: General   Post-op Pain Management: Minimal or no pain anticipated, Gabapentin PO (pre-op)* and Ofirmev IV (intra-op)*   Induction: Intravenous  PONV Risk Score and Plan: 0 and Ondansetron, Dexamethasone and Treatment may vary due to age or medical condition  Airway Management Planned: LMA  Additional Equipment: None  Intra-op Plan:   Post-operative Plan: Extubation in OR  Informed Consent: I have reviewed the patients History and Physical, chart, labs and discussed the procedure including the risks, benefits and alternatives for the proposed anesthesia with the patient or authorized representative who has indicated his/her understanding and acceptance.     Dental advisory given  Plan Discussed with: CRNA and Anesthesiologist  Anesthesia Plan Comments:        Anesthesia Quick Evaluation

## 2021-08-30 NOTE — Progress Notes (Signed)
PROGRESS NOTE    [name]  FMB:[id number] DOB: [birth year] DOA: 08/29/2021 PCP: Camillia Herter, NP   Brief Narrative:  51 y.o. male with medical history significant of iron deficiency anemia, stage IV CKD, CAD, type II DM, glaucoma, hyperlipidemia, hypertension, diabetic peripheral neuropathy, diabetic retinopathy, tobacco use, multiple lower extremities I&D and amputations presented with fall along with worsening pain and draining from left foot ulcer.  On presentation, CRP was 32.8, creatinine of 5.21 (baseline creatinine of 2.5-2.8). MRI of the foot with findings suspicious for acute osteomyelitis involving the proximal fifth metatarsal and distal cuboid.  Small fifth TMT joint effusion concerning for septic arthritis.  There is a large fluid collection within the plantar aspect of the forefoot and midfoot insinuated around the flexor tendon suggesting large volume flexor tenosynovitis, which could be septic or a septic.  He was started on broad-spectrum antibiotics.  Podiatry was consulted.  Assessment & Plan:   Left foot acute osteomyelitis/diabetic foot ulcer infection -Imaging as above.  Currently on broad-spectrum antibiotics.  Podiatry planning for surgical intervention today.  Follow recommendations.  Follow cultures.  Acute kidney injury on CKD stage IV Acute metabolic acidosis -creatinine of 5.21 on presentation.  Baseline creatinine of 2.5-2.8 -Creatinine 4.23 today.  Continue IV fluids  Essential hypertension -Blood pressure stable.  Continue amlodipine and Coreg  Hyperlipidemia -Statin plan as below  CAD -Stable.  Continue aspirin, beta-blocker and statin  Diabetes mellitus type 2 -A1c 7.5.  Continue long-acting insulin along with CBGs with SSI  Iron deficiency anemia/anemia of chronic disease -Continue iron supplementation.  Hemoglobin currently stable  Leukocytosis -Still significantly elevated WBCs.  Monitor  Thrombocytosis -Possibly reactive.   Monitor  Mildly elevated LFTs along with elevated bilirubin -Improving.  Hold statin   DVT prophylaxis: Lovenox Code Status: Full Family Communication: None at bedside Disposition Plan: Status is: Inpatient Remains inpatient appropriate because: Of severity of illness    Consultants: Podiatry  Procedures: None  Antimicrobials:  Anti-infectives (From admission, onward)    Start     Dose/Rate Route Frequency Ordered Stop   08/29/21 1600  [MAR Hold]  metroNIDAZOLE (FLAGYL) tablet 500 mg        (MAR Hold since Sun 08/30/2021 at 0811.Hold Reason: Transfer to a Procedural area)   500 mg Oral Every 12 hours 08/29/21 1501 09/05/21 2159   08/29/21 1600  [MAR Hold]  ceFEPIme (MAXIPIME) 2 g in sodium chloride 0.9 % 100 mL IVPB        (MAR Hold since Sun 08/30/2021 at 0811.Hold Reason: Transfer to a Procedural area)   2 g 200 mL/hr over 30 Minutes Intravenous Every 24 hours 08/29/21 1523     08/29/21 1545  [MAR Hold]  vancomycin (VANCOREADY) IVPB 750 mg/150 mL        (MAR Hold since Sun 08/30/2021 at 0811.Hold Reason: Transfer to a Procedural area)   750 mg 150 mL/hr over 60 Minutes Intravenous  Once 08/29/21 1535     08/29/21 1524  [MAR Hold]  vancomycin variable dose per unstable renal function (pharmacist dosing)        (MAR Hold since Sun 08/30/2021 at 0811.Hold Reason: Transfer to a Procedural area)    Does not apply See admin instructions 08/29/21 1525     08/29/21 1215  vancomycin (VANCOCIN) IVPB 1000 mg/200 mL premix        1,000 mg 200 mL/hr over 60 Minutes Intravenous  Once 08/29/21 1214 08/29/21 1332        Subjective: Patient seen  and examined at bedside.  Complains of left lower extremity pain.  No overnight fever, vomiting, worsening shortness of breath reported.  Objective: Vitals:   08/30/21 0948 08/30/21 1000 08/30/21 1015 08/30/21 1019  BP:  134/70 133/70   Pulse:      Resp: (!) 26 (!) 24 (!) 23 (!) 24  Temp:  98.7 F (37.1 C)    TempSrc:      SpO2:  98%  100%   Weight:      Height:        Intake/Output Summary (Last 24 hours) at 08/30/2021 1026 Last data filed at 08/30/2021 0934 Gross per 24 hour  Intake 2659.5 ml  Output 755 ml  Net 1904.5 ml   Filed Weights   08/29/21 1125  Weight: 87.5 kg    Examination:  General exam: Appears calm and comfortable.  Currently on room air. Respiratory system: Bilateral decreased breath sounds at bases with scattered crackles and intermittent tachypnea Cardiovascular system: S1 & S2 heard, Rate controlled Gastrointestinal system: Abdomen is nondistended, soft and nontender. Normal bowel sounds heard. Extremities: No cyanosis, clubbing; trace lower extremity edema present Central nervous system: Awake, slow to respond, poor historian.  No focal neurological deficits. Moving extremities Skin: Left foot dressing present.  No obvious ecchymosis or petechiae  psychiatry: No signs of agitation.  Affect is mostly flat.    Data Reviewed: I have personally reviewed following labs and imaging studies  CBC: Recent Labs  Lab 08/29/21 1200 08/30/21 0520  WBC 42.6* 39.8*  NEUTROABS 35.7*  --   HGB 11.2* 9.9*  HCT 33.4* 29.7*  MCV 83.3 81.6  PLT 461* 850*   Basic Metabolic Panel: Recent Labs  Lab 08/29/21 1213 08/30/21 0520  NA 134* 139  K 4.6 4.1  CL 107 113*  CO2 13* 15*  GLUCOSE 250* 149*  BUN 109* 103*  CREATININE 5.21* 4.23*  CALCIUM 9.1 8.4*   GFR: Estimated Creatinine Clearance: 20.6 mL/min (A) (by C-G formula based on SCr of 4.23 mg/dL (H)). Liver Function Tests: Recent Labs  Lab 08/29/21 1213 08/30/21 0520  AST 31 22  ALT 46* 30  ALKPHOS 110 95  BILITOT 2.4* 2.2*  PROT 7.6 6.4*  ALBUMIN 2.4* 2.0*   No results for input(s): LIPASE, AMYLASE in the last 168 hours. No results for input(s): AMMONIA in the last 168 hours. Coagulation Profile: No results for input(s): INR, PROTIME in the last 168 hours. Cardiac Enzymes: No results for input(s): CKTOTAL, CKMB, CKMBINDEX,  TROPONINI in the last 168 hours. BNP (last 3 results) No results for input(s): PROBNP in the last 8760 hours. HbA1C: Recent Labs    08/30/21 0520  HGBA1C 7.5*   CBG: Recent Labs  Lab 08/29/21 2140 08/30/21 0717 08/30/21 0948  GLUCAP 176* 164* 189*   Lipid Profile: No results for input(s): CHOL, HDL, LDLCALC, TRIG, CHOLHDL, LDLDIRECT in the last 72 hours. Thyroid Function Tests: No results for input(s): TSH, T4TOTAL, FREET4, T3FREE, THYROIDAB in the last 72 hours. Anemia Panel: No results for input(s): VITAMINB12, FOLATE, FERRITIN, TIBC, IRON, RETICCTPCT in the last 72 hours. Sepsis Labs: Recent Labs  Lab 08/29/21 1213 08/29/21 1810  LATICACIDVEN 1.8 1.4    Recent Results (from the past 240 hour(s))  Blood culture (routine x 2)     Status: None (Preliminary result)   Collection Time: 08/29/21 12:13 PM   Specimen: BLOOD  Result Value Ref Range Status   Specimen Description   Final    BLOOD RIGHT ANTECUBITAL Performed at [facility]  [place] [address], [place], [place] [address]    Special Requests   Final    BOTTLES DRAWN AEROBIC AND ANAEROBIC Blood Culture results may not be optimal due to an excessive volume of blood received in culture bottles Performed at [facility] [address], [place], [place] [address]    Culture   Final    NO GROWTH < 24 HOURS Performed at [facility] [address], [place], [place] [address]    Report Status PENDING  Incomplete  Aerobic/Anaerobic Culture w Gram Stain (surgical/deep wound)     Status: None (Preliminary result)   Collection Time: 08/29/21 12:35 PM   Specimen: Wound  Result Value Ref Range Status   Specimen Description   Final    WOUND LEFT FOOT Performed at [facility] [address], [place], [place] [address]    Special Requests   Final    NONE Performed at [facility] [place] [address] [place], [place] [address]    Gram Stain    Final    NO SQUAMOUS EPITHELIAL CELLS SEEN NO WBC SEEN MODERATE GRAM NEGATIVE RODS MODERATE GRAM POSITIVE COCCI    Culture   Final    CULTURE REINCUBATED FOR BETTER GROWTH Performed at [facility], [address] [place], [place] [address]    Report Status PENDING  Incomplete         Radiology Studies: MR FOOT LEFT WO CONTRAST  Result Date: 08/29/2021 CLINICAL DATA:  Foot swelling, diabetic, osteomyelitis suspected, xray done EXAM: MRI OF THE LEFT FOOT WITHOUT CONTRAST TECHNIQUE: Multiplanar, multisequence MR imaging of the left forefoot was performed. No intravenous contrast was administered. COMPARISON:  X-ray 08/29/2021, 06/28/2019, MRI 06/29/2019 FINDINGS: Technical Note: Despite efforts by the technologist and patient, motion artifact is present on today's exam and could not be eliminated. This reduces exam sensitivity and specificity. Bones/Joint/Cartilage Bone marrow edema within the base and proximal diaphysis of the fifth metatarsal with intermediate T1 marrow signal and erosion. Small fifth TMT joint effusion concerning for septic arthritis. Patchy marrow edema within the distal cuboid is also concerning for osteomyelitis. 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 Mild marrow edema within the first through fourth metatarsal bases and within the cuneiform bones which may be degenerative/reactive. Osteomyelitis at these locations would be difficult to exclude by imaging. Prior great toe amputation. No evidence of acute fracture. Unchanged alignment at the TMT joints. Ligaments Chronic Lisfranc ligament disruption. No evidence of acute ligamentous injury. Muscles and Tendons Large fluid collection within the plantar aspect of the forefoot and midfoot insinuated around the flexor tendons suggesting large volume tenosynovitis. Fluid focally measures up to 9.2 x 2.3 x 3.0 cm. Chronic denervation changes of the foot musculature. Soft tissues  Ulceration at the plantar lateral aspect of the proximal forefoot underlying the fifth TMT joint level. Extensive surrounding soft tissue edema. IMPRESSION: 1. Ulceration at the plantar-lateral aspect of the proximal forefoot underlying the fifth TMT joint level. Findings suspicious for acute osteomyelitis involving the proximal fifth metatarsal and distal cuboid. 2. Small fifth TMT joint effusion concerning for septic arthritis. 3. Large fluid collection within the plantar aspect of the forefoot and midfoot insinuated around the flexor tendons suggesting large volume flexor tenosynovitis, which could be septic or aseptic. 4. 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 Mild marrow edema within the first through fourth metatarsal bases and within the cuneiform bones which may  be degenerative/reactive. Osteomyelitis at these locations would be difficult to exclude by imaging. Electronically Signed   By: Davina Poke D.O.   On: 08/29/2021 17:47   DG Foot Complete Left  Result Date: 08/29/2021 CLINICAL DATA:  LEFT foot pain and infection. History of prior osteomyelitis. Diabetes. EXAM: LEFT FOOT - COMPLETE 3+ VIEW COMPARISON:  04/13/2021 MR, 07/22/2020 radiograph and prior studies FINDINGS: Cortical disruption and irregularity at the base of the 5th metatarsal is noted compatible with fracture/osteomyelitis. Osteolysis of the LATERAL aspect of the cuboid again noted compatible with abscess/osteomyelitis as identified on prior MR. Soft tissue swelling along the foot identified with soft tissue defect along the plantar aspect. Juxta-articular sclerosis and moderate-severe chronic joint changes/subluxation at the 1st, 2nd and 3rd tarsometatarsal joints again noted. Severe degenerative changes at the 2nd MTP joint noted and remote metatarsal fractures again identified. 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 Moderate plantar calcaneal spurring vascular calcifications are  again identified. IMPRESSION: 1. Cortical disruption and irregularity at the base of the 5th metatarsal compatible with fracture/osteomyelitis. 2. Osteolysis of the LATERAL aspect of the cuboid again noted compatible with abscess/osteomyelitis as identified on prior MR. 3. Soft tissue swelling with plantar soft tissue defect. 4. Chronic changes involving the 1st, 2nd and 3rd tarsometatarsal joints. Electronically Signed   By: Margarette Canada M.D.   On: 08/29/2021 12:56        Scheduled Meds:  [MAR Hold] amLODipine  10 mg Oral Daily   [MAR Hold] aspirin EC  81 mg Oral Daily   [MAR Hold] atorvastatin  20 mg Oral Daily   [MAR Hold] carvedilol  25 mg Oral BID WC   [MAR Hold] enoxaparin (LOVENOX) injection  30 mg Subcutaneous Q24H   [MAR Hold] ferrous sulfate  325 mg Oral Q breakfast   [MAR Hold] gabapentin  300 mg Oral TID   [MAR Hold] insulin aspart  0-9 Units Subcutaneous TID WC   [MAR Hold] insulin glargine-yfgn  15 Units Subcutaneous BID   [MAR Hold] metroNIDAZOLE  500 mg Oral Q12H   [MAR Hold] vancomycin variable dose per unstable renal function (pharmacist dosing)   Does not apply See admin instructions   Continuous Infusions:  [MAR Hold] ceFEPime (MAXIPIME) IV 2 g (08/29/21 2020)   lactated ringers 125 mL/hr at 08/30/21 0851   [MAR Hold] vancomycin            Aline August, MD [facility] 08/30/2021, 10:26 AM

## 2021-08-30 NOTE — Brief Op Note (Signed)
08/30/2021  9:41 AM  PATIENT:  [name]  51 y.o. male  PRE-OPERATIVE DIAGNOSIS:  OSTEOMYELITIS  POST-OPERATIVE DIAGNOSIS:  OSTEOMYELITIS  PROCEDURE:  Procedure(s): IRRIGATION AND DEBRIDEMENT LEFT FOOT; BONE BIOPSY (Left)  SURGEON:  Surgeon(s) and Role:    Trula Slade, DPM - Primary  PHYSICIAN ASSISTANT:   ASSISTANTS: none   ANESTHESIA:   general  EBL:  5 mL   BLOOD ADMINISTERED:none  DRAINS: none   LOCAL MEDICATIONS USED:  OTHER 20 cc lidocaine and marcaine plain  SPECIMEN:  Source of Specimen:  wound culture, bone culture  DISPOSITION OF SPECIMEN:  PATHOLOGY  COUNTS:  YES  TOURNIQUET:  * Missing tourniquet times found for documented tourniquets in log: [id number] *  DICTATION: .Viviann Spare Dictation  PLAN OF CARE: Admit to inpatient   PATIENT DISPOSITION:  PACU - hemodynamically stable.   Delay start of Pharmacological VTE agent (>24hrs) due to surgical blood loss or risk of bleeding: no  Intraoperative findings: Extensive necrosis and purulence noted along lateral foot along the 5th metatarsal base cuboid. Fragment of free floating bone. Bone was very soft. Extensive purulence tracking along the plantar midfoot flexor tendons and dorsal midfoot. Purulence tracking proximal lateral. I drained the abscess for source control but he is going to need a BKA.

## 2021-08-30 NOTE — Op Note (Signed)
PATIENT:  [name]  51 y.o. male   PRE-OPERATIVE DIAGNOSIS:  OSTEOMYELITIS   POST-OPERATIVE DIAGNOSIS:  OSTEOMYELITIS   PROCEDURE:  Procedure(s): IRRIGATION AND DEBRIDEMENT LEFT FOOT; BONE BIOPSY (Left)   SURGEON:  Surgeon(s) and Role:    * Trula Slade, DPM - Primary   PHYSICIAN ASSISTANT:    ASSISTANTS: none    ANESTHESIA:   general   EBL:  5 mL    BLOOD ADMINISTERED:none   DRAINS: none    LOCAL MEDICATIONS USED:  OTHER 20 cc lidocaine and marcaine plain   SPECIMEN:  Source of Specimen:  wound culture, bone culture   DISPOSITION OF SPECIMEN:  PATHOLOGY   COUNTS:  YES   TOURNIQUET:  * Missing tourniquet times found for documented tourniquets in log: [id number] *   DICTATION: .Viviann Spare Dictation   PLAN OF CARE: Admit to inpatient    PATIENT DISPOSITION:  PACU - hemodynamically stable.   Delay start of Pharmacological VTE agent (>24hrs) due to surgical blood loss or risk of bleeding: no   Intraoperative findings: Extensive necrosis and purulence noted along lateral foot along the 5th metatarsal base cuboid. Fragment of free floating bone. Bone was very soft. Extensive purulence tracking along the plantar midfoot flexor tendons and dorsal midfoot. Purulence tracking proximal lateral. I drained the abscess for source control but he is going to need a BKA.    Indications for surgery: 51 year old male with history of left foot ulceration presents emergency room yesterday for concerns of worsening infection of the left foot.  Upon admission found to be significantly elevated white blood cell count at 42.6 although clinically appear to be stable.  MRI was performed to rule out osteomyelitis, large abscess.  I discussed surgery with him including incision and drainage, debridement however he is at very high risk of limb loss and like to do surgery for source control and to further evaluate the extent of the infection.  We discussed all alternatives, risks, complications.   No promises or guarantees were given infection with the procedure and all questions were answered to the best my ability.  Procedure in detail: The patient was both verbally and visually identified myself and nursing staff in the anesthesia staff.  He was then transferred to the operating room via stretcher and placed on the operating table in supine position.  After general anesthesia was obtained the left lower extremities and scrubbed, prepped, draped in normal sterile fashion.  Timeout was performed.  Initially 10 cc of lidocaine, Marcaine plain was infiltrated regional block fashion.  I started by excising a area of necrotic tissue present on the fifth metatarsal cuboid joint with a 15 with scalpel.  This was circumferentially around the area completely the fifth metatarsal, cuboid area.  This went down directly to bone and the fifth metatarsal was exposed at times be quite necrotic.  There is loose fragments of bone which is able to remove and sent this for culture.  I took a culture of the purulence as well.  There is an ongoing sense of purulence noted coming just proximally as well which I decompressed with a hemostat as well as distally to the fifth MPJ.  At this time a hemostat was utilized to dissected the flexor tendons plantarly and there is found to be a significantly large abscess present on the plantar aspect of the foot and there is necrosis of the plantar musculature.  Admitted secondary stab incision medial to the wound and no further purulence was identified.  There  is 1 to be extensive infection in the plantar musculature of the foot as well as upon range of motion the Lisfranc joint was unstable.  At this time hemostasis was achieved I irrigated the wound with 3 L of saline.  The wound was then packed with Dakin solution followed by dry sterile dressing.  At this time he was woke up anesthesia and found to tolerate the procedure well any complications.  Transferred to PACU vital signs  stable and vascular status intact.  Postoperative course: Patient likely is going need a below-knee amputation to the extent of the infection.  [facility] be [facility] to [facility].  I discussed this with him after he arrived back in his hospital room also discussed over the phone with his girlfriend.  He is in agreement to that below-knee potation's I do think this is his best option.

## 2021-08-30 NOTE — Interval H&P Note (Signed)
History and Physical Interval Note:  08/30/2021 8:21 AM  [name]  has presented today for surgery, with the diagnosis of OSTEOMYELITIS.  The various methods of treatment have been discussed with the patient and family. After consideration of risks, benefits and other options for treatment, the patient has consented to  Procedure(s): IRRIGATION AND DEBRIDEMENT LEFT FOOT; BONE BIOPSY (Left) as a surgical intervention.  The patient's history has been reviewed, patient examined, no change in status, stable for surgery.  I have reviewed the patient's chart and labs.  Questions were answered to the patient's satisfaction.    I again discussed with the patient he is at very high risk of leg loss, which he understands. If we are able to save the foot then he will need to return to the OR for further debridement, partial foot amputation.    Trula Slade

## 2021-08-30 NOTE — Anesthesia Procedure Notes (Signed)
Procedure Name: LMA Insertion Date/Time: 08/30/2021 9:00 AM Performed by: Myna Bright, CRNA Pre-anesthesia Checklist: Patient identified, Emergency Drugs available, Patient being monitored and Suction available Patient Re-evaluated:Patient Re-evaluated prior to induction Oxygen Delivery Method: Circle system utilized Preoxygenation: Pre-oxygenation with 100% oxygen Induction Type: IV induction Ventilation: Mask ventilation without difficulty LMA: LMA inserted LMA Size: 4.0 Tube type: Oral Number of attempts: 1 Placement Confirmation: positive ETCO2 and breath sounds checked- equal and bilateral Tube secured with: Tape Dental Injury: Teeth and Oropharynx as per pre-operative assessment

## 2021-08-31 ENCOUNTER — Encounter (HOSPITAL_COMMUNITY): Payer: Medicare Other

## 2021-08-31 ENCOUNTER — Inpatient Hospital Stay (HOSPITAL_COMMUNITY): Payer: Medicare Other

## 2021-08-31 ENCOUNTER — Encounter (HOSPITAL_COMMUNITY): Payer: Self-pay | Admitting: Podiatry

## 2021-08-31 DIAGNOSIS — M86179 Other acute osteomyelitis, unspecified ankle and foot: Secondary | ICD-10-CM | POA: Diagnosis not present

## 2021-08-31 DIAGNOSIS — N184 Chronic kidney disease, stage 4 (severe): Secondary | ICD-10-CM | POA: Diagnosis not present

## 2021-08-31 DIAGNOSIS — L97529 Non-pressure chronic ulcer of other part of left foot with unspecified severity: Secondary | ICD-10-CM | POA: Diagnosis not present

## 2021-08-31 DIAGNOSIS — M86272 Subacute osteomyelitis, left ankle and foot: Secondary | ICD-10-CM

## 2021-08-31 DIAGNOSIS — N179 Acute kidney failure, unspecified: Secondary | ICD-10-CM | POA: Diagnosis not present

## 2021-08-31 DIAGNOSIS — E11621 Type 2 diabetes mellitus with foot ulcer: Secondary | ICD-10-CM

## 2021-08-31 DIAGNOSIS — L0390 Cellulitis, unspecified: Secondary | ICD-10-CM | POA: Diagnosis not present

## 2021-08-31 DIAGNOSIS — E43 Unspecified severe protein-calorie malnutrition: Secondary | ICD-10-CM

## 2021-08-31 LAB — CBC
HCT: 29.7 % — ABNORMAL LOW (ref 39.0–52.0)
Hemoglobin: 10.2 g/dL — ABNORMAL LOW (ref 13.0–17.0)
MCH: 27.5 pg (ref 26.0–34.0)
MCHC: 34.3 g/dL (ref 30.0–36.0)
MCV: 80.1 fL (ref 80.0–100.0)
Platelets: 478 10*3/uL — ABNORMAL HIGH (ref 150–400)
RBC: 3.71 MIL/uL — ABNORMAL LOW (ref 4.22–5.81)
RDW: 14.9 % (ref 11.5–15.5)
WBC: 50.9 10*3/uL (ref 4.0–10.5)
nRBC: 0 % (ref 0.0–0.2)

## 2021-08-31 LAB — COMPREHENSIVE METABOLIC PANEL
ALT: 43 U/L (ref 0–44)
AST: 47 U/L — ABNORMAL HIGH (ref 15–41)
Albumin: 1.8 g/dL — ABNORMAL LOW (ref 3.5–5.0)
Alkaline Phosphatase: 107 U/L (ref 38–126)
Anion gap: 10 (ref 5–15)
BUN: 108 mg/dL — ABNORMAL HIGH (ref 6–20)
CO2: 14 mmol/L — ABNORMAL LOW (ref 22–32)
Calcium: 8.3 mg/dL — ABNORMAL LOW (ref 8.9–10.3)
Chloride: 114 mmol/L — ABNORMAL HIGH (ref 98–111)
Creatinine, Ser: 3.84 mg/dL — ABNORMAL HIGH (ref 0.61–1.24)
GFR, Estimated: 18 mL/min — ABNORMAL LOW (ref >60)
Glucose, Bld: 233 mg/dL — ABNORMAL HIGH (ref 70–99)
Potassium: 4.9 mmol/L (ref 3.5–5.1)
Sodium: 138 mmol/L (ref 135–145)
Total Bilirubin: 2 mg/dL — ABNORMAL HIGH (ref 0.3–1.2)
Total Protein: 6.5 g/dL (ref 6.5–8.1)

## 2021-08-31 LAB — GLUCOSE, CAPILLARY
Glucose-Capillary: 197 mg/dL — ABNORMAL HIGH (ref 70–99)
Glucose-Capillary: 233 mg/dL — ABNORMAL HIGH (ref 70–99)
Glucose-Capillary: 226 mg/dL — ABNORMAL HIGH (ref 70–99)
Glucose-Capillary: 210 mg/dL — ABNORMAL HIGH (ref 70–99)

## 2021-08-31 LAB — MAGNESIUM: Magnesium: 2.5 mg/dL — ABNORMAL HIGH (ref 1.7–2.4)

## 2021-08-31 LAB — VANCOMYCIN, RANDOM: Vancomycin Rm: 12 ug/mL

## 2021-08-31 MED ORDER — VANCOMYCIN HCL 750 MG/150ML IV SOLN
750.0000 mg | Freq: Once | INTRAVENOUS | Status: AC
  Administered 2021-08-31: 750 mg via INTRAVENOUS
  Filled 2021-08-31: qty 150

## 2021-08-31 MED ORDER — ENSURE ENLIVE PO LIQD
237.0000 mL | Freq: Two times a day (BID) | ORAL | Status: DC
  Administered 2021-09-01 – 2021-09-07 (×10): 237 mL via ORAL

## 2021-08-31 MED ORDER — ADULT MULTIVITAMIN W/MINERALS CH
1.0000 | ORAL_TABLET | Freq: Every day | ORAL | Status: DC
  Administered 2021-08-31 – 2021-09-15 (×16): 1 via ORAL
  Filled 2021-08-31 (×16): qty 1

## 2021-08-31 MED ORDER — SODIUM CHLORIDE 0.45 % IV SOLN
INTRAVENOUS | Status: DC
  Filled 2021-08-31 (×12): qty 75

## 2021-08-31 MED ORDER — JUVEN PO PACK
1.0000 | PACK | Freq: Two times a day (BID) | ORAL | Status: DC
  Administered 2021-09-01 – 2021-09-15 (×15): 1 via ORAL
  Filled 2021-08-31 (×20): qty 1

## 2021-08-31 NOTE — Progress Notes (Incomplete)
Introduction:  History of present illness:  Past medical history/allergies/family history pertinent to current illness:  PTA meds:  Social hx:  Physical exam/relevant labs, diagnostic testing/imaging:   Vanc [id number] q 48  Problem based assessment and plan:  - Problem 1:  A. Assessment:  B. Plan:  - Problem 2:  A. Assessment:  B. Plan:  - Problem 3:  A. Assessment:  B. Plan:

## 2021-08-31 NOTE — Progress Notes (Signed)
   08/31/21 2023  Provider Notification  Provider Name/Title Reece Levy  Date Provider Notified 08/31/21  Time Provider Notified [phone]  Method of Notification Page  Notification Reason Critical result  Test performed and critical result WBC 50.9  Date Critical Result Received 08/31/21  Time Critical Result Received 0508

## 2021-08-31 NOTE — Progress Notes (Signed)
ABI's have been completed. Preliminary results can be found in CV Proc through chart review.   08/31/21 1:26 PM [name] RVT

## 2021-08-31 NOTE — Progress Notes (Signed)
Pharmacy Antibiotic Note  [name] is a 51 y.o. male admitted on 08/29/2021 with diabetic foot infection/osteomyelitis. Pharmacy has been consulted for Vancomycin and Cefepime dosing.  Scr remains elevated but trending down Patient did not receive additional '750mg'$  loading dose as ordered yesterday Random Vancomycin level 12 ~ 18h after dose   Plan: Vancomycin '750mg'$  IV x 1. Recheck random Vancomycin level with daily labs 6/6 Cefepime 2g IV q24h. Metronidazole adjusted to q12h dosing. Monitor renal function, cultures, clinical course.    Height: '5\' 4"'$  (162.6 cm) Weight: 87.5 kg (193 lb) IBW/kg (Calculated) : 59.2  Temp (24hrs), Avg:98.4 F (36.9 C), Min:97.5 F (36.4 C), Max:99 F (37.2 C)  Recent Labs  Lab 08/29/21 1200 08/29/21 1213 08/29/21 1810 08/30/21 0520 08/31/21 0449  WBC 42.6*  --   --  39.8* 50.9*  CREATININE  --  5.21*  --  4.23* 3.84*  LATICACIDVEN  --  1.8 1.4  --   --   VANCORANDOM  --   --   --   --  12     Estimated Creatinine Clearance: 22.7 mL/min (A) (by C-G formula based on SCr of 3.84 mg/dL (H)).    Allergies  Allergen Reactions   Bee Venom Swelling    SWELLING REACTION UNSPECIFIED    Penicillins Hives and Other (See Comments)    Full body hives as a child with no shortness of breath or swelling  **Can tolerate cephalosporins**   Clonidine Derivatives Other (See Comments)    Pt states "It is making me dizzy"    Antimicrobials this admission: 6/3 Vancomycin >> 6/3 Cefepime >> 6/3 Metronidazole >>  Dose adjustments this admission: --  Microbiology results: 6/3 BCx: NGTD 6/3 L foot wound: proteus mirabilis, enterococcus faecalis  Thank you for allowing pharmacy to be a part of this patient's care.   Netta Cedars, PharmD, BCPS Clinical Pharmacist  08/31/2021 6:01 AM
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
## 2021-08-31 NOTE — Progress Notes (Signed)
Initial Nutrition Assessment  DOCUMENTATION CODES:   Obesity unspecified  INTERVENTION:  Encourage adequate PO intake Liberalize diet from a heart healthy/carb modified to a carb modified diet to provide widest variety of menu options to enhance nutritional adequacy Ensure Enlive po BID, each supplement provides 350 kcal and 20 grams of protein. (Strawberry and vanilla) 1 packet Juven BID, each packet provides 95 calories, 2.5 grams of protein (collagen), and 9.8 grams of carbohydrate (3 grams sugar); + vitamins and minerals to support wound healing MVI with minerals daily  NUTRITION DIAGNOSIS:   Increased nutrient needs related to acute illness, wound healing as evidenced by estimated needs.  GOAL:   Patient will meet greater than or equal to 90% of their needs  MONITOR:   PO intake, Supplement acceptance, Labs, Weight trends, Diet advancement  REASON FOR ASSESSMENT:   Consult Wound healing  ASSESSMENT:   Pt admitted d/t fall. Found to have diabetic ulcer/acute osteomyelitis of L foot d/t T2DM. PMH significant for seasonal allergies, iron deficiency anemia, CKD stage 3B, CAD, constipation, T2DM, glaucoma, HLD, HTN, diabetic peripheral neuropathy, diabetic retinopathy, tobacco use, multiple lower extremity I&D and amputations.   06/04: s/p I&D L foot, bone biopsy 06/05: transferred to [facility] for possible surgical intervention- Podiatry recommend possible BKA  Spoke with pt via phone call to room. Pt states that he has not had an appetite and has only eaten once since being the the hospital which he reports has been ~4 days. He would like to eat more but does not have the desire to. Pt is familiar with Ensure and would like to receive these during admission. He is also agreeable trying Juven to help promote wound healing. Spoke with pt about the importance of blood sugar control and adequate, balanced nutrition to promote proper wound healing. He also reports that he has not had the  desire for tobacco use which he is pleased with and hopes to continue as he is aware this will help with over all health.   At home pt manages his diabetes via insulin and checking blood sugars daily.   Meal completions: 06/05: 0%-breakfast  Pt reports steady wt and denies recent wt loss. Reviewed wt hx. Wt appears to remain stable. Current admit wt: 87.5 kg.   Medications: ferrous sulfate, SSI 0-9 units TID, semglee 15 units BID, flagyl IV drips: abx, sodium bicarbonate in NaCl '@100ml'$ /hr  Labs: BUN 108, Cr 3.84, Mg 2.5, AST 47, GFR 18, HgbA1c 7.5%, CBG's 174-233 x24 hours  NUTRITION - FOCUSED PHYSICAL EXAM: RD working remotely. Deferred to follow up.   Diet Order:   Diet Order             Diet Carb Modified Fluid consistency: Thin; Room service appropriate? Yes  Diet effective now                   EDUCATION NEEDS:   Education needs have been addressed  Skin:  Skin Assessment: Skin Integrity Issues: Skin Integrity Issues:: Diabetic Ulcer, Incisions Diabetic Ulcer: L foot (open/dehisced) Incisions: L foot (closed)  Last BM:  6/3  Height:   Ht Readings from Last 1 Encounters:  08/29/21 '5\' 4"'$  (1.626 m)    Weight:   Wt Readings from Last 1 Encounters:  08/29/21 87.5 kg    Ideal Body Weight:  59.1 kg  BMI:  Body mass index is 33.13 kg/m.  Estimated Nutritional Needs:   Kcal:  1800-2000  Protein:  90-105g  Fluid:  >/=1.8L  Clayborne Dana, RDN,  LDN Clinical Nutrition
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
## 2021-08-31 NOTE — Progress Notes (Signed)
PROGRESS NOTE    [name]  OAC:[id number] DOB: 05/24/1970 DOA: 08/29/2021 PCP: Camillia Herter, NP   Brief Narrative:  51 y.o. male with medical history significant of iron deficiency anemia, stage IV CKD, CAD, type II DM, glaucoma, hyperlipidemia, hypertension, diabetic peripheral neuropathy, diabetic retinopathy, tobacco use, multiple lower extremities I&D and amputations presented with fall along with worsening pain and draining from left foot ulcer.  On presentation, CRP was 32.8, creatinine of 5.21 (baseline creatinine of 2.5-2.8). MRI of the foot with findings suspicious for acute osteomyelitis involving the proximal fifth metatarsal and distal cuboid.  Small fifth TMT joint effusion concerning for septic arthritis.  There is a large fluid collection within the plantar aspect of the forefoot and midfoot insinuated around the flexor tendon suggesting large volume flexor tenosynovitis, which could be septic or a septic.  He was started on broad-spectrum antibiotics.  Podiatry was consulted.  Assessment & Plan:   Left foot acute osteomyelitis/diabetic foot ulcer infection -Imaging as above.  Currently on broad-spectrum antibiotics.  Status post I&D and left foot bone biopsy done by podiatry on 08/30/21.  Podiatry recommends possible BKA.  Communicated with Dr. Sharol Given on 08/30/2021 over recommended to transfer to [facility] for possible further surgical intervention.  Transfer is pending.  Acute kidney injury on CKD stage IV Acute metabolic acidosis -creatinine of 5.21 on presentation.  Baseline creatinine of 2.5-2.8 -Creatinine 3.84 today.  Still acidotic.  Start bicarb drip.  Monitor  Essential hypertension -Blood pressure stable.  Continue amlodipine and Coreg  Hyperlipidemia -Statin plan as below  CAD -Stable.  Continue aspirin, beta-blocker and statin  Diabetes mellitus type 2 -A1c 7.5.  Continue long-acting insulin along with CBGs with SSI  Iron deficiency  anemia/anemia of chronic disease -Continue iron supplementation.  Hemoglobin currently stable  Leukocytosis -Worsening.  Monitor  Thrombocytosis -Possibly reactive.  Monitor  Mildly elevated LFTs along with elevated bilirubin -Improving.  Hold statin   DVT prophylaxis: Hold Lovenox for possible further surgical intervention. Code Status: Full Family Communication: None at bedside Disposition Plan: Status is: Inpatient Remains inpatient appropriate because: Of severity of illness    Consultants: Podiatry  Procedures: Left foot I&D with left hip bone biopsy on 08/30/2021 Antimicrobials:  Anti-infectives (From admission, onward)    Start     Dose/Rate Route Frequency Ordered Stop   08/31/21 0730  vancomycin (VANCOREADY) IVPB 750 mg/150 mL        750 mg 150 mL/hr over 60 Minutes Intravenous  Once 08/31/21 0611     08/29/21 1600  metroNIDAZOLE (FLAGYL) tablet 500 mg        500 mg Oral Every 12 hours 08/29/21 1501 09/05/21 2159   08/29/21 1600  ceFEPIme (MAXIPIME) 2 g in sodium chloride 0.9 % 100 mL IVPB        2 g 200 mL/hr over 30 Minutes Intravenous Every 24 hours 08/29/21 1523     08/29/21 1545  vancomycin (VANCOREADY) IVPB 750 mg/150 mL  Status:  Discontinued        750 mg 150 mL/hr over 60 Minutes Intravenous  Once 08/29/21 1535 08/31/21 0611   08/29/21 1524  vancomycin variable dose per unstable renal function (pharmacist dosing)         Does not apply See admin instructions 08/29/21 1525     08/29/21 1215  vancomycin (VANCOCIN) IVPB 1000 mg/200 mL premix        1,000 mg 200 mL/hr over 60 Minutes Intravenous  Once 08/29/21 1214 08/29/21 1332  Subjective: Patient seen and examined at bedside.  No overnight fever, vomiting, worsening shortness of breath reported.  Still complains of intermittent left foot pain. Objective: Vitals:   08/30/21 1041 08/30/21 1410 08/30/21 2024 08/31/21 0357  BP: 133/72 (!) 141/77 (!) 146/75 (!) 152/85  Pulse: 79 81 77 79  Resp:  16 (!) 22 (!) 22 (!) 22  Temp: 98.8 F (37.1 C) 98 F (36.7 C) 98.2 F (36.8 C) (!) 97.5 F (36.4 C)  TempSrc: Oral Oral Oral Oral  SpO2: 96% 99% 99% 98%  Weight:      Height:        Intake/Output Summary (Last 24 hours) at 08/31/2021 0753 Last data filed at 08/31/2021 0358 Gross per 24 hour  Intake 700 ml  Output 755 ml  Net -55 ml    Filed Weights   08/29/21 1125  Weight: 87.5 kg    Examination:  General: On room air.  No distress ENT/neck: No thyromegaly.  JVD is not elevated  respiratory: Decreased breath sounds at bases bilaterally with some crackles; no wheezing.  Intermittent tachypnea present  CVS: S1-S2 heard, rate controlled Abdominal: Soft, nontender, slightly distended; no organomegaly, normal bowel sounds are heard Extremities: Trace lower extremity edema; no cyanosis  CNS: Awake and alert.  No focal neurologic deficit.  Moves extremities Lymph: No obvious lymphadenopathy Skin: Left foot dressing present.  No obvious petechiae/ecchymosis  psych: No signs of agitation.  Affect is mostly flat. musculoskeletal: No obvious joint swelling/deformity     Data Reviewed: I have personally reviewed following labs and imaging studies  CBC: Recent Labs  Lab 08/29/21 1200 08/30/21 0520 08/31/21 0449  WBC 42.6* 39.8* 50.9*  NEUTROABS 35.7*  --   --   HGB 11.2* 9.9* 10.2*  HCT 33.4* 29.7* 29.7*  MCV 83.3 81.6 80.1  PLT 461* 435* 478*    Basic Metabolic Panel: Recent Labs  Lab 08/29/21 1213 08/30/21 0520 08/31/21 0449  NA 134* 139 138  K 4.6 4.1 4.9  CL 107 113* 114*  CO2 13* 15* 14*  GLUCOSE 250* 149* 233*  BUN 109* 103* 108*  CREATININE 5.21* 4.23* 3.84*  CALCIUM 9.1 8.4* 8.3*  MG  --   --  2.5*    GFR: Estimated Creatinine Clearance: 22.7 mL/min (A) (by C-G formula based on SCr of 3.84 mg/dL (H)). Liver Function Tests: Recent Labs  Lab 08/29/21 1213 08/30/21 0520 08/31/21 0449  AST 31 22 47*  ALT 46* 30 43  ALKPHOS 110 95 107  BILITOT  2.4* 2.2* 2.0*  PROT 7.6 6.4* 6.5  ALBUMIN 2.4* 2.0* 1.8*    No results for input(s): LIPASE, AMYLASE in the last 168 hours. No results for input(s): AMMONIA in the last 168 hours. Coagulation Profile: No results for input(s): INR, PROTIME in the last 168 hours. Cardiac Enzymes: No results for input(s): CKTOTAL, CKMB, CKMBINDEX, TROPONINI in the last 168 hours. BNP (last 3 results) No results for input(s): PROBNP in the last 8760 hours. HbA1C: Recent Labs    08/30/21 0520  HGBA1C 7.5*    CBG: Recent Labs  Lab 08/30/21 0948 08/30/21 1042 08/30/21 1627 08/30/21 2053 08/31/21 0729  GLUCAP 189* 174* 186* 202* 197*    Lipid Profile: No results for input(s): CHOL, HDL, LDLCALC, TRIG, CHOLHDL, LDLDIRECT in the last 72 hours. Thyroid Function Tests: No results for input(s): TSH, T4TOTAL, FREET4, T3FREE, THYROIDAB in the last 72 hours. Anemia Panel: No results for input(s): VITAMINB12, FOLATE, FERRITIN, TIBC, IRON, RETICCTPCT in the last 72  hours. Sepsis Labs: Recent Labs  Lab 08/29/21 1213 08/29/21 1810  LATICACIDVEN 1.8 1.4     Recent Results (from the past 240 hour(s))  Blood culture (routine x 2)     Status: None (Preliminary result)   Collection Time: 08/29/21 12:13 PM   Specimen: BLOOD  Result Value Ref Range Status   Specimen Description   Final    BLOOD RIGHT ANTECUBITAL Performed at [facility] [address] [place], [place] [address]    Special Requests   Final    BOTTLES DRAWN AEROBIC AND ANAEROBIC Blood Culture results may not be optimal due to an excessive volume of blood received in culture bottles Performed at [facility] [address] [place], [place] [address]    Culture   Final    NO GROWTH < 24 HOURS Performed at [facility] [address] [place], [place] [address]    Report Status PENDING  Incomplete  Aerobic/Anaerobic Culture w Gram Stain (surgical/deep wound)     Status: None  (Preliminary result)   Collection Time: 08/29/21 12:35 PM   Specimen: Wound  Result Value Ref Range Status   Specimen Description   Final    WOUND LEFT FOOT Performed at [facility] [address], [place], [place] [address]    Special Requests   Final    NONE Performed at [facility], [place] [address], [place], [place] [address]    Gram Stain   Final    NO SQUAMOUS EPITHELIAL CELLS SEEN NO WBC SEEN MODERATE GRAM NEGATIVE RODS MODERATE GRAM POSITIVE COCCI    Culture   Final    ABUNDANT PROTEUS MIRABILIS MODERATE ENTEROCOCCUS FAECALIS SUSCEPTIBILITIES TO FOLLOW Performed at [facility], [place] [address], [place], [place] [address]    Report Status PENDING  Incomplete  Aerobic/Anaerobic Culture w Gram Stain (surgical/deep wound)     Status: None (Preliminary result)   Collection Time: 08/30/21  9:53 AM   Specimen: Wound  Result Value Ref Range Status   Specimen Description   Final    WOUND LEFT FOOT WOUND Performed at [facility], [place] [address] [place], [place] [address]    Special Requests   Final    NONE Performed at [facility], [name] [address] [place] [address], [place], [place] [address]    Gram Stain   Final    RARE WBC PRESENT, PREDOMINANTLY PMN MODERATE GRAM POSITIVE COCCI IN PAIRS MODERATE GRAM VARIABLE ROD Performed at [facility], [place] [address] [place],  [address]    Culture PENDING  Incomplete   Report Status PENDING  Incomplete          Radiology Studies: MR FOOT LEFT WO CONTRAST  Result Date: 08/29/2021 CLINICAL DATA:  Foot swelling, diabetic, osteomyelitis suspected, xray done EXAM: MRI OF THE LEFT FOOT WITHOUT CONTRAST TECHNIQUE: Multiplanar, multisequence MR imaging of the left forefoot was performed. No intravenous contrast was administered. COMPARISON:  X-ray 08/29/2021, 06/28/2019, MRI 06/29/2019 FINDINGS: Technical Note: Despite efforts by the technologist and patient, motion  artifact is present on today's exam and could not be eliminated. This reduces exam sensitivity and specificity. Bones/Joint/Cartilage Bone marrow edema within the base and proximal diaphysis of the fifth metatarsal with intermediate T1 marrow signal and erosion. Small fifth TMT joint effusion concerning for septic arthritis. Patchy marrow edema within the distal cuboid is also concerning for osteomyelitis. 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 Mild  marrow edema within the first through fourth metatarsal bases and within the cuneiform bones which may be degenerative/reactive. Osteomyelitis at these locations would be difficult to exclude by imaging. Prior great toe amputation. No evidence of acute fracture. Unchanged alignment at the TMT joints. Ligaments Chronic Lisfranc ligament disruption. No evidence of acute ligamentous injury. Muscles and Tendons Large fluid collection within the plantar aspect of the forefoot and midfoot insinuated around the flexor tendons suggesting large volume tenosynovitis. Fluid focally measures up to 9.2 x 2.3 x 3.0 cm. Chronic denervation changes of the foot musculature. Soft tissues Ulceration at the plantar lateral aspect of the proximal forefoot underlying the fifth TMT joint level. Extensive surrounding soft tissue edema. IMPRESSION: 1. Ulceration at the plantar-lateral aspect of the proximal forefoot underlying the fifth TMT joint level. Findings suspicious for acute osteomyelitis involving the proximal fifth metatarsal and distal cuboid. 2. Small fifth TMT joint effusion concerning for septic arthritis. 3. Large fluid collection within the plantar aspect of the forefoot and midfoot insinuated around the flexor tendons suggesting large volume flexor tenosynovitis, which could be septic or aseptic. 4. 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 Mild marrow edema within the first through fourth  metatarsal bases and within the cuneiform bones which may be degenerative/reactive. Osteomyelitis at these locations would be difficult to exclude by imaging. Electronically Signed   By: Davina Poke D.O.   On: 08/29/2021 17:47   DG Foot Complete Left  Result Date: 08/29/2021 CLINICAL DATA:  LEFT foot pain and infection. History of prior osteomyelitis. Diabetes. EXAM: LEFT FOOT - COMPLETE 3+ VIEW COMPARISON:  04/13/2021 MR, 07/22/2020 radiograph and prior studies FINDINGS: Cortical disruption and irregularity at the base of the 5th metatarsal is noted compatible with fracture/osteomyelitis. Osteolysis of the LATERAL aspect of the cuboid again noted compatible with abscess/osteomyelitis as identified on prior MR. Soft tissue swelling along the foot identified with soft tissue defect along the plantar aspect. Juxta-articular sclerosis and moderate-severe chronic joint changes/subluxation at the 1st, 2nd and 3rd tarsometatarsal joints again noted. Severe degenerative changes at the 2nd MTP joint noted and remote metatarsal fractures again identified. 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 Moderate plantar calcaneal spurring vascular calcifications are again identified. IMPRESSION: 1. Cortical disruption and irregularity at the base of the 5th metatarsal compatible with fracture/osteomyelitis. 2. Osteolysis of the LATERAL aspect of the cuboid again noted compatible with abscess/osteomyelitis as identified on prior MR. 3. Soft tissue swelling with plantar soft tissue defect. 4. Chronic changes involving the 1st, 2nd and 3rd tarsometatarsal joints. Electronically Signed   By: Margarette Canada M.D.   On: 08/29/2021 12:56        Scheduled Meds:  amLODipine  10 mg Oral Daily   aspirin EC  81 mg Oral Daily   atorvastatin  20 mg Oral Daily   carvedilol  25 mg Oral BID WC   enoxaparin (LOVENOX) injection  30 mg Subcutaneous Q24H   ferrous sulfate  325 mg Oral Q breakfast   gabapentin  300 mg Oral TID    insulin aspart  0-9 Units Subcutaneous TID WC   insulin glargine-yfgn  15 Units Subcutaneous BID   metroNIDAZOLE  500 mg Oral Q12H   vancomycin variable dose per unstable renal function (pharmacist dosing)   Does not apply See admin instructions   Continuous Infusions:  ceFEPime (MAXIPIME) IV 2 g (08/30/21 1711)   lactated ringers 125 mL/hr at 08/30/21 2059   vancomycin  Aline August, MD [facility] 08/31/2021, 7:53 AM

## 2021-09-01 ENCOUNTER — Inpatient Hospital Stay (HOSPITAL_COMMUNITY): Payer: Medicare Other

## 2021-09-01 DIAGNOSIS — E782 Mixed hyperlipidemia: Secondary | ICD-10-CM

## 2021-09-01 DIAGNOSIS — N184 Chronic kidney disease, stage 4 (severe): Secondary | ICD-10-CM

## 2021-09-01 DIAGNOSIS — E11621 Type 2 diabetes mellitus with foot ulcer: Secondary | ICD-10-CM | POA: Diagnosis not present

## 2021-09-01 DIAGNOSIS — N179 Acute kidney failure, unspecified: Secondary | ICD-10-CM | POA: Diagnosis not present

## 2021-09-01 DIAGNOSIS — I25119 Atherosclerotic heart disease of native coronary artery with unspecified angina pectoris: Secondary | ICD-10-CM | POA: Diagnosis not present

## 2021-09-01 LAB — COMPREHENSIVE METABOLIC PANEL
ALT: 41 U/L (ref 0–44)
AST: 32 U/L (ref 15–41)
Albumin: 1.6 g/dL — ABNORMAL LOW (ref 3.5–5.0)
Alkaline Phosphatase: 110 U/L (ref 38–126)
Anion gap: 7 (ref 5–15)
BUN: 113 mg/dL — ABNORMAL HIGH (ref 6–20)
CO2: 16 mmol/L — ABNORMAL LOW (ref 22–32)
Calcium: 8 mg/dL — ABNORMAL LOW (ref 8.9–10.3)
Chloride: 113 mmol/L — ABNORMAL HIGH (ref 98–111)
Creatinine, Ser: 4.07 mg/dL — ABNORMAL HIGH (ref 0.61–1.24)
GFR, Estimated: 17 mL/min — ABNORMAL LOW (ref >60)
Glucose, Bld: 204 mg/dL — ABNORMAL HIGH (ref 70–99)
Potassium: 4.5 mmol/L (ref 3.5–5.1)
Sodium: 136 mmol/L (ref 135–145)
Total Bilirubin: 1.3 mg/dL — ABNORMAL HIGH (ref 0.3–1.2)
Total Protein: 5.9 g/dL — ABNORMAL LOW (ref 6.5–8.1)

## 2021-09-01 LAB — VANCOMYCIN, RANDOM: Vancomycin Rm: 20 ug/mL

## 2021-09-01 LAB — MAGNESIUM: Magnesium: 2.3 mg/dL (ref 1.7–2.4)

## 2021-09-01 LAB — CBC
HCT: 27.5 % — ABNORMAL LOW (ref 39.0–52.0)
Hemoglobin: 9.8 g/dL — ABNORMAL LOW (ref 13.0–17.0)
MCH: 27.6 pg (ref 26.0–34.0)
MCHC: 35.6 g/dL (ref 30.0–36.0)
MCV: 77.5 fL — ABNORMAL LOW (ref 80.0–100.0)
Platelets: 494 10*3/uL — ABNORMAL HIGH (ref 150–400)
RBC: 3.55 MIL/uL — ABNORMAL LOW (ref 4.22–5.81)
RDW: 14.8 % (ref 11.5–15.5)
WBC: 50.5 10*3/uL (ref 4.0–10.5)
nRBC: 0 % (ref 0.0–0.2)

## 2021-09-01 LAB — GLUCOSE, CAPILLARY
Glucose-Capillary: 161 mg/dL — ABNORMAL HIGH (ref 70–99)
Glucose-Capillary: 179 mg/dL — ABNORMAL HIGH (ref 70–99)
Glucose-Capillary: 157 mg/dL — ABNORMAL HIGH (ref 70–99)
Glucose-Capillary: 123 mg/dL — ABNORMAL HIGH (ref 70–99)

## 2021-09-01 LAB — SURGICAL PATHOLOGY

## 2021-09-01 MED ORDER — GABAPENTIN 300 MG PO CAPS
300.0000 mg | ORAL_CAPSULE | Freq: Every day | ORAL | Status: DC
  Administered 2021-09-02 – 2021-09-14 (×13): 300 mg via ORAL
  Filled 2021-09-01 (×13): qty 1

## 2021-09-01 MED ORDER — INSULIN ASPART 100 UNIT/ML IJ SOLN
0.0000 [IU] | Freq: Three times a day (TID) | INTRAMUSCULAR | Status: DC
  Administered 2021-09-01 (×2): 3 [IU] via SUBCUTANEOUS

## 2021-09-01 MED ORDER — VANCOMYCIN HCL 750 MG/150ML IV SOLN
750.0000 mg | INTRAVENOUS | Status: DC
  Administered 2021-09-02: 750 mg via INTRAVENOUS
  Filled 2021-09-01: qty 150

## 2021-09-01 NOTE — Assessment & Plan Note (Addendum)
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  -Op note with findings of purulent abscess that extended up to the popliteal fossa with necrotic muscle along the medial head of the gastrocnemius muscle and medial soleus.   -ID consulted and recommended discontinuation of IV vancomycin and IV cefepime and placing patient on daptomycin plus cefazolin.  -ID initially was recommending at least 4 weeks of IV antibiotics and recommending to check a sed rate and CRP. -Due to renal function unable to place a PICC at this time, nephrology recommending tunneled central line for prolonged antibiotics which was placed 09/04/2021 per IR. -Patient taken back to the OR per orthopedics, Dr. Sharol Given on 09/04/2021 and patient noted to have purulence draining from below the knee amputation and as such orthopedics proceeded with left AKA. -Per orthopedics clear margins noted. -ID following and recommending to continue on daptomycin every other day and cefazolin for the time being until white count normalizes over the next 7 days then can discontinue IV tunneled line.  -Patient still with a significantly elevated leukocytosis with no significant clinical improvement. -CT left hip and pelvis and CT left femur ordered and negative for abscess or any signs of septic arthritis.. - Continue nutritional supplements, vitamins -ID and orthopedics following and appreciate input and recommendations.

## 2021-09-01 NOTE — Assessment & Plan Note (Addendum)
-   Repeat LFTs stable.   -Statin resumed.

## 2021-09-01 NOTE — Assessment & Plan Note (Addendum)
-   Continue aspirin, BB, statin.

## 2021-09-01 NOTE — Assessment & Plan Note (Addendum)
-   Continue amlodipine, Coreg.  -Clonidine held, likely will not resume on discharge -Continue hydralazine 25 mg 3 times daily.

## 2021-09-01 NOTE — Assessment & Plan Note (Addendum)
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 Now 2.5-2.8 baseline. - Nephrology was following.
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
## 2021-09-01 NOTE — Assessment & Plan Note (Addendum)
-   Hemoglobin A1c 7.5 (08/30/2021 ). -CBG 28 of basic metabolic profile the morning of 09/02/2021. -Patient noted to have received Semglee while n.p.o., which has been discontinued. -CBGs have improved and CBG of 80 this morning. -Discontinued D5 normal saline. -Continue Semglee 10 units daily, SSI.

## 2021-09-01 NOTE — Assessment & Plan Note (Addendum)
-   Albumin was at  < 1.5. -IV albumin every 6 hours x24 to 48 hours was given, albumin currently at 2.8 today. -continue nutritional supplementation with Ensure.

## 2021-09-01 NOTE — Hospital Course (Addendum)
[name] is a 51 y.o. 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  He was evaluated by surgery who recommended transtibial amputation.   6/3: Admitted, Cr 5.2 on admission (from baseline 2.5); Podiatry evaluated, MRI showed osteo, no salvage operations were judged possible 6/5: Transferred to [facility], ABIs done, inconclusive

## 2021-09-01 NOTE — Progress Notes (Addendum)
Pharmacy Antibiotic Note  [name] is a 51 y.o. male admitted on 08/29/2021 with diabetic foot infection/osteomyelitis. Pharmacy has been consulted for Vancomycin and Cefepime dosing.  Scr remains elevated and trending up at 4.07.  WBC is trending up.  Vanomycin random level is 20 - drawn 23 hrs after last 750 dose and not a steady state level.   Plan: Vancomycin '750mg'$  IV every 48 hours for now.  Continue Cefepime 2g IV every 24 hours.  Monitor ability to narrow-- current cultures pending but based on available sensitivities may be able to narrow to Unasyn therapy.  Monitor renal function, nephrology consult, and clinical status.    Height: '5\' 4"'$  (162.6 cm) Weight: 87.5 kg (193 lb) IBW/kg (Calculated) : 59.2  Temp (24hrs), Avg:97.6 F (36.4 C), Min:97.4 F (36.3 C), Max:97.9 F (36.6 C)  Recent Labs  Lab 08/29/21 1200 08/29/21 1213 08/29/21 1810 08/30/21 0520 08/31/21 0449 09/01/21 0138  WBC 42.6*  --   --  39.8* 50.9* 50.5*  CREATININE  --  5.21*  --  4.23* 3.84* 4.07*  LATICACIDVEN  --  1.8 1.4  --   --   --   VANCORANDOM  --   --   --   --  12 20    Estimated Creatinine Clearance: 21.4 mL/min (A) (by C-G formula based on SCr of 4.07 mg/dL (H)).    Allergies  Allergen Reactions   Bee Venom Swelling    SWELLING REACTION UNSPECIFIED    Penicillins Hives and Other (See Comments)    Full body hives as a child with no shortness of breath or swelling  **Can tolerate cephalosporins**   Clonidine Derivatives Other (See Comments)    Pt states "It is making me dizzy"    Antimicrobials this admission: 6/3 Vancomycin >> 6/3 Cefepime >> 6/3 Metronidazole >>  Dose adjustments this admission: --  Microbiology results: 6/3 BCx: ngtd 6/3 L foot wound: abundant Proteus mirabilis - pan-sensitive, moderate Enterococcus faecalis - pan-sensitive (pending) 6/4 L foot wound (OR cx): mod Proteus, few Enterococcus faecalis (re-incubated)  Thank you for allowing pharmacy  to be a part of this patient's care.   Sloan Leiter, PharmD, BCPS, BCCCP Clinical Pharmacist Please refer to [facility] for [place] numbers 09/01/2021 12:48 PM

## 2021-09-01 NOTE — Assessment & Plan Note (Addendum)
Hgb trickled down to approximately 6.6 on 09/07/2021.   -Patient transfused 2 units packed red blood cells with hemoglobin currently at 9.7.   -Likely secondary to postop acute blood loss anemia. -Status post transfusion 2 units packed red blood cells.   - Continue iron -Follow.

## 2021-09-01 NOTE — Assessment & Plan Note (Addendum)
Renal function 5.2 on admission, up from basleine around 2.5-2.8 since Jan. Here, was initially improving, but in last day, despite bicarb fluids, trending back up >4 mg/dL, in setting of vancomycin use. -Patient also noted to be taking NSAIDs prior to admission. - Patient seen in consultation by nephrology and patient placed on a bicarb drip.  -Renal function slowly improved and seems to be stabilizing currently at 2.42 today.   -Bicarb drip has been discontinued. -Nephrology was following but have signed off on 09/05/2021 with recommendations for outpatient follow-up with Dr. [name] after discharge. -Avoid nephrotoxic agents.

## 2021-09-01 NOTE — Progress Notes (Signed)
Progress Note   Patient: [name] YTK:[id number] DOB: [birth year] DOA: 08/29/2021     3 DOS: the patient was seen and examined on 09/01/2021 at 12:34PM      Brief hospital course: [name] is a 51 y.o. 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  He was evaluated by surgery who recommended transtibial amputation.   6/3: Admitted, Cr 5.2 on admission (from baseline 2.5); Podiatry evaluated, MRI showed osteo, no salvage operations were judged possible 6/5: Transferred to [facility], ABIs done, inconclusive     Assessment and Plan: * Diabetic ulcer of left foot associated with type 2 diabetes mellitus ([place])    Osteomyelitis of foot, acute ([place]) - Continue vancomycin, cefepime and flagyl - Consult Orthopedic surgery, plan for BKA tomorrow - Continue nutritional supplements, vitamins  Acute kidney injury superimposed on CKD ([place]) Renal function 5.2 on admission, up from basleine around 2.5-2.8 since Jan. Here, was initially improving, but in last day, despite bicarb fluids, trending back up >4 mg/dL, in setting of vancomycin use - Consult Nephrology, appreciate cares   CKD (chronic kidney disease), stage IV ([place]) Cr seems to have worsened since about Jan when he had endocarditis. Now 2.5-2.8 baseline. - [facility] Nephrology  Transaminitis Tbili mildly up, better today, likely unconjugated  Severe protein-calorie malnutrition ([place]) - Consult nutrition  Mixed hyperlipidemia - Recheck LFTs tomorrow and restart statin if AST/ALT normal  Type 2 diabetes mellitus with hyperglycemia ([name]) Glucoses 190s-200s - Continue lantus - Continue SS corrections, increase scale  Anemia in chronic kidney disease (CKD) Hgb stable - Continue iron  Iron deficiency anemia - Continue iron  Coronary atherosclerosis of native  coronary artery - Continue aspirin, BB - Hold statin given transaminitis  Essential hypertension BP controlled - Continue amlodipine, Coreg - Hold home clonidine          Subjective: Patient with numbness in his left heel, some swelling in the left foot.  No fever, no confusion, no dyspnea, no orthopnea.     Physical Exam: Vitals:   08/31/21 2046 09/01/21 0029 09/01/21 0538 09/01/21 0800  BP: 121/76 131/82 129/87 126/78  Pulse: 70 72 72 67  Resp: '18 18 16 17  '$ Temp: (!) 97.5 F (36.4 C) 97.9 F (36.6 C) 97.7 F (36.5 C) (!) 97.5 F (36.4 C)  TempSrc: Oral Oral Oral Oral  SpO2: 99% 97% 95% 98%  Weight:      Height:       Adult male, lying in bed flat, no acute distress, interactive RRR no murmurs, mild pitting edema in left foot, no JVD Respiratory rate normal, no rales or wehezing Abdomen soft without tenderness to palpation or guarding Attention normal, affect normal, moves all four extremities with 5/5 strength, sensation intact on right leg, on the left, there is mild numbness in the posterior, in the heel and up the back of the calf, associated with swelling. 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  Data Reviewed: Ortho notes reviewed, nursing notes and vitals reviewed BMP showesd worsening Creatinine, Glucoses elevated, Tbili better CBC shows persistent leukocytosis     Disposition: Status is: Inpatient Patient admitted with osteomyelitis.  No salvage possible.  Will  go to amputation on Wednesday, work with PT after that for disposition.    Hopefully renal function stabilizes and improves        Author: Edwin Dada, MD 09/01/2021 12:32 PM  For on call review [email or web address].

## 2021-09-01 NOTE — Assessment & Plan Note (Signed)
Continue iron  

## 2021-09-01 NOTE — Assessment & Plan Note (Addendum)
-  Total bilirubin has improved currently down to 0.7 -Follow.

## 2021-09-02 ENCOUNTER — Encounter (HOSPITAL_COMMUNITY): Payer: Self-pay | Admitting: Internal Medicine

## 2021-09-02 ENCOUNTER — Inpatient Hospital Stay (HOSPITAL_COMMUNITY): Payer: Medicare Other | Admitting: Anesthesiology

## 2021-09-02 ENCOUNTER — Encounter (HOSPITAL_COMMUNITY)
Admission: EM | Disposition: A | Discharge status: Skilled Nursing Facility | Payer: Self-pay | Source: Home / Self Care | Attending: Internal Medicine

## 2021-09-02 ENCOUNTER — Other Ambulatory Visit: Payer: Self-pay

## 2021-09-02 DIAGNOSIS — N179 Acute kidney failure, unspecified: Secondary | ICD-10-CM | POA: Diagnosis not present

## 2021-09-02 DIAGNOSIS — I2510 Atherosclerotic heart disease of native coronary artery without angina pectoris: Secondary | ICD-10-CM | POA: Diagnosis not present

## 2021-09-02 DIAGNOSIS — L02416 Cutaneous abscess of left lower limb: Secondary | ICD-10-CM

## 2021-09-02 DIAGNOSIS — L97429 Non-pressure chronic ulcer of left heel and midfoot with unspecified severity: Secondary | ICD-10-CM | POA: Diagnosis not present

## 2021-09-02 DIAGNOSIS — R7401 Elevation of levels of liver transaminase levels: Secondary | ICD-10-CM

## 2021-09-02 DIAGNOSIS — M869 Osteomyelitis, unspecified: Secondary | ICD-10-CM

## 2021-09-02 DIAGNOSIS — E1169 Type 2 diabetes mellitus with other specified complication: Secondary | ICD-10-CM | POA: Diagnosis not present

## 2021-09-02 DIAGNOSIS — N184 Chronic kidney disease, stage 4 (severe): Secondary | ICD-10-CM | POA: Diagnosis not present

## 2021-09-02 DIAGNOSIS — E11621 Type 2 diabetes mellitus with foot ulcer: Secondary | ICD-10-CM | POA: Diagnosis not present

## 2021-09-02 HISTORY — PX: AMPUTATION: SHX166

## 2021-09-02 LAB — GLUCOSE, CAPILLARY
Glucose-Capillary: 146 mg/dL — ABNORMAL HIGH (ref 70–99)
Glucose-Capillary: 108 mg/dL — ABNORMAL HIGH (ref 70–99)
Glucose-Capillary: 96 mg/dL (ref 70–99)
Glucose-Capillary: 75 mg/dL (ref 70–99)
Glucose-Capillary: 70 mg/dL (ref 70–99)
Glucose-Capillary: 111 mg/dL — ABNORMAL HIGH (ref 70–99)
Glucose-Capillary: 88 mg/dL (ref 70–99)
Glucose-Capillary: 106 mg/dL — ABNORMAL HIGH (ref 70–99)
Glucose-Capillary: 95 mg/dL (ref 70–99)
Glucose-Capillary: 206 mg/dL — ABNORMAL HIGH (ref 70–99)

## 2021-09-02 LAB — CBC
HCT: 26.9 % — ABNORMAL LOW (ref 39.0–52.0)
Hemoglobin: 9.6 g/dL — ABNORMAL LOW (ref 13.0–17.0)
MCH: 27.5 pg (ref 26.0–34.0)
MCHC: 35.7 g/dL (ref 30.0–36.0)
MCV: 77.1 fL — ABNORMAL LOW (ref 80.0–100.0)
Platelets: 489 10*3/uL — ABNORMAL HIGH (ref 150–400)
RBC: 3.49 MIL/uL — ABNORMAL LOW (ref 4.22–5.81)
RDW: 15.1 % (ref 11.5–15.5)
WBC: 45.5 10*3/uL — ABNORMAL HIGH (ref 4.0–10.5)
nRBC: 0.1 % (ref 0.0–0.2)

## 2021-09-02 LAB — COMPREHENSIVE METABOLIC PANEL
ALT: 31 U/L (ref 0–44)
AST: 28 U/L (ref 15–41)
Albumin: 1.5 g/dL — ABNORMAL LOW (ref 3.5–5.0)
Alkaline Phosphatase: 124 U/L (ref 38–126)
Anion gap: 14 (ref 5–15)
BUN: 113 mg/dL — ABNORMAL HIGH (ref 6–20)
CO2: 18 mmol/L — ABNORMAL LOW (ref 22–32)
Calcium: 7.9 mg/dL — ABNORMAL LOW (ref 8.9–10.3)
Chloride: 107 mmol/L (ref 98–111)
Creatinine, Ser: 3.78 mg/dL — ABNORMAL HIGH (ref 0.61–1.24)
GFR, Estimated: 18 mL/min — ABNORMAL LOW (ref >60)
Glucose, Bld: 28 mg/dL — CL (ref 70–99)
Potassium: 3.8 mmol/L (ref 3.5–5.1)
Sodium: 139 mmol/L (ref 135–145)
Total Bilirubin: 1.2 mg/dL (ref 0.3–1.2)
Total Protein: 5.9 g/dL — ABNORMAL LOW (ref 6.5–8.1)

## 2021-09-02 LAB — SURGICAL PCR SCREEN
MRSA, PCR: NEGATIVE
Staphylococcus aureus: NEGATIVE

## 2021-09-02 SURGERY — AMPUTATION BELOW KNEE
Anesthesia: General | Site: Knee | Laterality: Left

## 2021-09-02 MED ORDER — SODIUM CHLORIDE 0.9 % IV SOLN: INTRAVENOUS | Status: DC

## 2021-09-02 MED ORDER — JUVEN PO PACK: 1.0000 | PACK | Freq: Two times a day (BID) | ORAL | Status: DC

## 2021-09-02 MED ORDER — OXYCODONE HCL 5 MG/5ML PO SOLN: 5.0000 mg | Freq: Once | ORAL | Status: DC | PRN

## 2021-09-02 MED ORDER — CHLORHEXIDINE GLUCONATE 0.12 % MT SOLN
OROMUCOSAL | Status: AC
  Administered 2021-09-02: 15 mL via OROMUCOSAL
  Filled 2021-09-02: qty 15

## 2021-09-02 MED ORDER — CEFAZOLIN SODIUM-DEXTROSE 2-4 GM/100ML-% IV SOLN: 2.0000 g | INTRAVENOUS | Status: DC

## 2021-09-02 MED ORDER — DEXAMETHASONE SODIUM PHOSPHATE 10 MG/ML IJ SOLN
INTRAMUSCULAR | Status: DC | PRN
  Administered 2021-09-02: 4 mg via INTRAVENOUS

## 2021-09-02 MED ORDER — MIDAZOLAM HCL 2 MG/2ML IJ SOLN
INTRAMUSCULAR | Status: AC
  Administered 2021-09-02: 1 mg via INTRAVENOUS
  Filled 2021-09-02: qty 2

## 2021-09-02 MED ORDER — TRANEXAMIC ACID-NACL 1000-0.7 MG/100ML-% IV SOLN: 1000.0000 mg | INTRAVENOUS | Status: DC

## 2021-09-02 MED ORDER — ONDANSETRON HCL 4 MG/2ML IJ SOLN
INTRAMUSCULAR | Status: DC | PRN
  Administered 2021-09-02: 4 mg via INTRAVENOUS

## 2021-09-02 MED ORDER — MIDAZOLAM HCL 2 MG/2ML IJ SOLN
INTRAMUSCULAR | Status: AC
  Filled 2021-09-02: qty 2

## 2021-09-02 MED ORDER — PHENYLEPHRINE 80 MCG/ML (10ML) SYRINGE FOR IV PUSH (FOR BLOOD PRESSURE SUPPORT)
PREFILLED_SYRINGE | INTRAVENOUS | Status: DC | PRN
  Administered 2021-09-02: 80 ug via INTRAVENOUS
  Administered 2021-09-02: 160 ug via INTRAVENOUS
  Administered 2021-09-02: 80 ug via INTRAVENOUS
  Administered 2021-09-02: 160 ug via INTRAVENOUS
  Administered 2021-09-02: 80 ug via INTRAVENOUS

## 2021-09-02 MED ORDER — PHENOL 1.4 % MT LIQD: 1.0000 | OROMUCOSAL | Status: DC | PRN

## 2021-09-02 MED ORDER — CHLORHEXIDINE GLUCONATE 4 % EX LIQD
60.0000 mL | Freq: Once | CUTANEOUS | Status: DC
  Filled 2021-09-02: qty 60

## 2021-09-02 MED ORDER — DOCUSATE SODIUM 100 MG PO CAPS
100.0000 mg | ORAL_CAPSULE | Freq: Every day | ORAL | Status: DC
Start: 2021-09-03 — End: 2021-09-15
  Administered 2021-09-03 – 2021-09-08 (×5): 100 mg via ORAL
  Filled 2021-09-02 (×14): qty 1

## 2021-09-02 MED ORDER — MAGNESIUM CITRATE PO SOLN: 1.0000 | Freq: Once | ORAL | Status: DC | PRN

## 2021-09-02 MED ORDER — DEXAMETHASONE SODIUM PHOSPHATE 10 MG/ML IJ SOLN
INTRAMUSCULAR | Status: AC
  Filled 2021-09-02: qty 2

## 2021-09-02 MED ORDER — CEFAZOLIN SODIUM-DEXTROSE 2-4 GM/100ML-% IV SOLN
2.0000 g | INTRAVENOUS | Status: AC
  Filled 2021-09-02: qty 100

## 2021-09-02 MED ORDER — LABETALOL HCL 5 MG/ML IV SOLN: 10.0000 mg | INTRAVENOUS | Status: DC | PRN

## 2021-09-02 MED ORDER — OXYCODONE HCL 5 MG PO TABS
5.0000 mg | ORAL_TABLET | ORAL | Status: DC | PRN
  Administered 2021-09-02: 5 mg via ORAL
  Administered 2021-09-03 – 2021-09-14 (×8): 10 mg via ORAL
  Filled 2021-09-02 (×10): qty 2

## 2021-09-02 MED ORDER — ROPIVACAINE HCL 5 MG/ML IJ SOLN
INTRAMUSCULAR | Status: DC | PRN
  Administered 2021-09-02: 50 mL via PERINEURAL

## 2021-09-02 MED ORDER — ZINC SULFATE 220 (50 ZN) MG PO CAPS
220.0000 mg | ORAL_CAPSULE | Freq: Every day | ORAL | Status: AC
  Administered 2021-09-02 – 2021-09-15 (×14): 220 mg via ORAL
  Filled 2021-09-02 (×14): qty 1

## 2021-09-02 MED ORDER — ONDANSETRON HCL 4 MG/2ML IJ SOLN
INTRAMUSCULAR | Status: AC
  Filled 2021-09-02: qty 12

## 2021-09-02 MED ORDER — POTASSIUM CHLORIDE CRYS ER 20 MEQ PO TBCR: 20.0000 meq | EXTENDED_RELEASE_TABLET | Freq: Every day | ORAL | Status: DC | PRN

## 2021-09-02 MED ORDER — FENTANYL CITRATE (PF) 100 MCG/2ML IJ SOLN
INTRAMUSCULAR | Status: AC
  Administered 2021-09-02: 50 ug via INTRAVENOUS
  Filled 2021-09-02: qty 2

## 2021-09-02 MED ORDER — MAGNESIUM SULFATE 2 GM/50ML IV SOLN: 2.0000 g | Freq: Every day | INTRAVENOUS | Status: DC | PRN

## 2021-09-02 MED ORDER — INSULIN ASPART 100 UNIT/ML IJ SOLN: 0.0000 [IU] | INTRAMUSCULAR | Status: DC | PRN

## 2021-09-02 MED ORDER — POVIDONE-IODINE 10 % EX SWAB: 2.0000 "application " | Freq: Once | CUTANEOUS | Status: DC

## 2021-09-02 MED ORDER — CEFAZOLIN SODIUM-DEXTROSE 2-3 GM-%(50ML) IV SOLR
INTRAVENOUS | Status: DC | PRN
  Administered 2021-09-02: 2 g via INTRAVENOUS

## 2021-09-02 MED ORDER — HYDROMORPHONE HCL 1 MG/ML IJ SOLN
0.5000 mg | INTRAMUSCULAR | Status: DC | PRN
  Administered 2021-09-04 – 2021-09-14 (×5): 1 mg via INTRAVENOUS
  Filled 2021-09-02 (×5): qty 1

## 2021-09-02 MED ORDER — POLYETHYLENE GLYCOL 3350 17 G PO PACK
17.0000 g | PACK | Freq: Every day | ORAL | Status: DC | PRN
Start: 2021-09-02 — End: 2021-09-15

## 2021-09-02 MED ORDER — FENTANYL CITRATE (PF) 100 MCG/2ML IJ SOLN
INTRAMUSCULAR | Status: AC
  Filled 2021-09-02: qty 2

## 2021-09-02 MED ORDER — PANTOPRAZOLE SODIUM 40 MG PO TBEC
40.0000 mg | DELAYED_RELEASE_TABLET | Freq: Every day | ORAL | Status: DC
  Administered 2021-09-02 – 2021-09-15 (×14): 40 mg via ORAL
  Filled 2021-09-02 (×14): qty 1

## 2021-09-02 MED ORDER — EPHEDRINE SULFATE-NACL 50-0.9 MG/10ML-% IV SOSY
PREFILLED_SYRINGE | INTRAVENOUS | Status: DC | PRN
  Administered 2021-09-02: 5 mg via INTRAVENOUS

## 2021-09-02 MED ORDER — METOPROLOL TARTRATE 5 MG/5ML IV SOLN: 2.0000 mg | INTRAVENOUS | Status: DC | PRN

## 2021-09-02 MED ORDER — HYDRALAZINE HCL 20 MG/ML IJ SOLN
5.0000 mg | INTRAMUSCULAR | Status: DC | PRN
  Filled 2021-09-02: qty 1

## 2021-09-02 MED ORDER — ASCORBIC ACID 500 MG PO TABS
1000.0000 mg | ORAL_TABLET | Freq: Every day | ORAL | Status: DC
  Administered 2021-09-02 – 2021-09-15 (×14): 1000 mg via ORAL
  Filled 2021-09-02 (×14): qty 2

## 2021-09-02 MED ORDER — 0.9 % SODIUM CHLORIDE (POUR BTL) OPTIME
TOPICAL | Status: DC | PRN
  Administered 2021-09-02: 1000 mL

## 2021-09-02 MED ORDER — ORAL CARE MOUTH RINSE
15.0000 mL | Freq: Once | OROMUCOSAL | Status: AC
Start: 2021-09-02 — End: 2021-09-02

## 2021-09-02 MED ORDER — HYDROMORPHONE HCL 1 MG/ML IJ SOLN: 0.2500 mg | INTRAMUSCULAR | Status: DC | PRN

## 2021-09-02 MED ORDER — MUPIROCIN 2 % EX OINT
1.0000 "application " | TOPICAL_OINTMENT | Freq: Two times a day (BID) | CUTANEOUS | Status: AC
  Administered 2021-09-02 – 2021-09-06 (×9): 1 via NASAL
  Filled 2021-09-02 (×2): qty 22

## 2021-09-02 MED ORDER — DEXTROSE 50 % IV SOLN: 25.0000 mL | Freq: Once | INTRAVENOUS | Status: AC

## 2021-09-02 MED ORDER — DEXTROSE 50 % IV SOLN
INTRAVENOUS | Status: AC
  Administered 2021-09-02: 25 g via INTRAVENOUS
  Filled 2021-09-02: qty 50

## 2021-09-02 MED ORDER — FENTANYL CITRATE (PF) 250 MCG/5ML IJ SOLN
INTRAMUSCULAR | Status: AC
  Filled 2021-09-02: qty 5

## 2021-09-02 MED ORDER — OXYCODONE HCL 5 MG PO TABS
10.0000 mg | ORAL_TABLET | ORAL | Status: DC | PRN
  Administered 2021-09-05 (×2): 15 mg via ORAL
  Administered 2021-09-06: 10 mg via ORAL
  Administered 2021-09-09: 15 mg via ORAL
  Administered 2021-09-10: 10 mg via ORAL
  Administered 2021-09-10 – 2021-09-11 (×4): 15 mg via ORAL
  Administered 2021-09-11: 10 mg via ORAL
  Filled 2021-09-02: qty 2
  Filled 2021-09-02 (×8): qty 3

## 2021-09-02 MED ORDER — DEXTROSE 50 % IV SOLN: 25.0000 g | INTRAVENOUS | Status: AC

## 2021-09-02 MED ORDER — PROPOFOL 10 MG/ML IV BOLUS
INTRAVENOUS | Status: DC | PRN
  Administered 2021-09-02: 200 mg via INTRAVENOUS

## 2021-09-02 MED ORDER — ALUM & MAG HYDROXIDE-SIMETH 200-200-20 MG/5ML PO SUSP: 15.0000 mL | ORAL | Status: DC | PRN

## 2021-09-02 MED ORDER — ONDANSETRON HCL 4 MG/2ML IJ SOLN
4.0000 mg | Freq: Four times a day (QID) | INTRAMUSCULAR | Status: DC | PRN
  Filled 2021-09-02: qty 2

## 2021-09-02 MED ORDER — CHLORHEXIDINE GLUCONATE 4 % EX LIQD
Freq: Once | CUTANEOUS | Status: AC
Start: 2021-09-02 — End: 2021-09-02
  Filled 2021-09-02: qty 15

## 2021-09-02 MED ORDER — AMISULPRIDE (ANTIEMETIC) 5 MG/2ML IV SOLN: 10.0000 mg | Freq: Once | INTRAVENOUS | Status: DC | PRN

## 2021-09-02 MED ORDER — MIDAZOLAM HCL 2 MG/2ML IJ SOLN: 1.0000 mg | Freq: Once | INTRAMUSCULAR | Status: AC

## 2021-09-02 MED ORDER — LIDOCAINE 2% (20 MG/ML) 5 ML SYRINGE
INTRAMUSCULAR | Status: AC
  Filled 2021-09-02: qty 5

## 2021-09-02 MED ORDER — ACETAMINOPHEN 325 MG PO TABS: 325.0000 mg | ORAL_TABLET | Freq: Four times a day (QID) | ORAL | Status: DC | PRN

## 2021-09-02 MED ORDER — PROPOFOL 10 MG/ML IV BOLUS
INTRAVENOUS | Status: AC
  Filled 2021-09-02: qty 20

## 2021-09-02 MED ORDER — LIDOCAINE 2% (20 MG/ML) 5 ML SYRINGE
INTRAMUSCULAR | Status: DC | PRN
  Administered 2021-09-02: 40 mg via INTRAVENOUS

## 2021-09-02 MED ORDER — INSULIN ASPART 100 UNIT/ML IJ SOLN
0.0000 [IU] | INTRAMUSCULAR | Status: DC
  Administered 2021-09-02 – 2021-09-03 (×3): 3 [IU] via SUBCUTANEOUS
  Administered 2021-09-03: 5 [IU] via SUBCUTANEOUS
  Administered 2021-09-03: 2 [IU] via SUBCUTANEOUS
  Administered 2021-09-03 (×2): 5 [IU] via SUBCUTANEOUS
  Administered 2021-09-04 (×2): 2 [IU] via SUBCUTANEOUS
  Administered 2021-09-04 – 2021-09-05 (×2): 3 [IU] via SUBCUTANEOUS
  Administered 2021-09-05 (×2): 2 [IU] via SUBCUTANEOUS
  Administered 2021-09-05: 3 [IU] via SUBCUTANEOUS
  Filled 2021-09-02: qty 1

## 2021-09-02 MED ORDER — BISACODYL 5 MG PO TBEC: 5.0000 mg | DELAYED_RELEASE_TABLET | Freq: Every day | ORAL | Status: DC | PRN

## 2021-09-02 MED ORDER — CHLORHEXIDINE GLUCONATE 0.12 % MT SOLN
15.0000 mL | Freq: Once | OROMUCOSAL | Status: AC
Start: 2021-09-02 — End: 2021-09-02

## 2021-09-02 MED ORDER — DEXTROSE-NACL 5-0.9 % IV SOLN: INTRAVENOUS | Status: DC

## 2021-09-02 MED ORDER — GUAIFENESIN-DM 100-10 MG/5ML PO SYRP
15.0000 mL | ORAL_SOLUTION | ORAL | Status: DC | PRN
Start: 2021-09-02 — End: 2021-09-15

## 2021-09-02 MED ORDER — FENTANYL CITRATE (PF) 100 MCG/2ML IJ SOLN: 50.0000 ug | Freq: Once | INTRAMUSCULAR | Status: AC

## 2021-09-02 MED ORDER — PROMETHAZINE HCL 25 MG/ML IJ SOLN: 6.2500 mg | INTRAMUSCULAR | Status: DC | PRN

## 2021-09-02 MED ORDER — PHENYLEPHRINE 80 MCG/ML (10ML) SYRINGE FOR IV PUSH (FOR BLOOD PRESSURE SUPPORT)
PREFILLED_SYRINGE | INTRAVENOUS | Status: AC
  Filled 2021-09-02: qty 40

## 2021-09-02 MED ORDER — TRANEXAMIC ACID-NACL 1000-0.7 MG/100ML-% IV SOLN
1000.0000 mg | INTRAVENOUS | Status: AC
Start: 2021-09-02 — End: 2021-09-02
  Administered 2021-09-02: 1000 mg via INTRAVENOUS
  Filled 2021-09-02: qty 100

## 2021-09-02 MED ORDER — DEXTROSE 50 % IV SOLN
INTRAVENOUS | Status: AC
  Administered 2021-09-02: 25 mL via INTRAVENOUS
  Filled 2021-09-02: qty 50

## 2021-09-02 MED ORDER — OXYCODONE HCL 5 MG PO TABS: 5.0000 mg | ORAL_TABLET | Freq: Once | ORAL | Status: DC | PRN

## 2021-09-02 SURGICAL SUPPLY — 41 items
BAG COUNTER SPONGE SURGICOUNT (BAG) IMPLANT
BAG SPNG CNTER NS LX DISP (BAG)
BLADE SAW RECIP 87.9 MT (BLADE) ×3 IMPLANT
BLADE SURG 21 STRL SS (BLADE) ×3 IMPLANT
BNDG CMPR MED 10X6 ELC LF (GAUZE/BANDAGES/DRESSINGS) ×1
BNDG COHESIVE 6X5 TAN STRL LF (GAUZE/BANDAGES/DRESSINGS) IMPLANT
BNDG ELASTIC 6X10 VLCR STRL LF (GAUZE/BANDAGES/DRESSINGS) ×1 IMPLANT
BNDG GAUZE ELAST 4 BULKY (GAUZE/BANDAGES/DRESSINGS) ×1 IMPLANT
CANISTER WOUND CARE 500ML ATS (WOUND CARE) ×3 IMPLANT
COVER SURGICAL LIGHT HANDLE (MISCELLANEOUS) ×3 IMPLANT
CUFF TOURN SGL QUICK 34 (TOURNIQUET CUFF) ×2
CUFF TRNQT CYL 34X4.125X (TOURNIQUET CUFF) ×2 IMPLANT
DRAPE INCISE IOBAN 66X45 STRL (DRAPES) ×3 IMPLANT
DRAPE U-SHAPE 47X51 STRL (DRAPES) ×3 IMPLANT
DRESSING PREVENA PLUS CUSTOM (GAUZE/BANDAGES/DRESSINGS) ×2 IMPLANT
DRSG PREVENA PLUS CUSTOM (GAUZE/BANDAGES/DRESSINGS) ×2
DURAPREP 26ML APPLICATOR (WOUND CARE) ×3 IMPLANT
ELECT REM PT RETURN 9FT ADLT (ELECTROSURGICAL) ×2
ELECTRODE REM PT RTRN 9FT ADLT (ELECTROSURGICAL) ×2 IMPLANT
GLOVE BIOGEL PI IND STRL 9 (GLOVE) ×2 IMPLANT
GLOVE BIOGEL PI INDICATOR 9 (GLOVE) ×1
GLOVE SURG ORTHO 9.0 STRL STRW (GLOVE) ×3 IMPLANT
GOWN STRL REUS W/ TWL XL LVL3 (GOWN DISPOSABLE) ×4 IMPLANT
GOWN STRL REUS W/TWL XL LVL3 (GOWN DISPOSABLE) ×4
KIT BASIN OR (CUSTOM PROCEDURE TRAY) ×3 IMPLANT
KIT TURNOVER KIT B (KITS) ×3 IMPLANT
MANIFOLD NEPTUNE II (INSTRUMENTS) ×3 IMPLANT
NS IRRIG 1000ML POUR BTL (IV SOLUTION) ×3 IMPLANT
PACK ORTHO EXTREMITY (CUSTOM PROCEDURE TRAY) ×3 IMPLANT
PAD ARMBOARD 7.5X6 YLW CONV (MISCELLANEOUS) ×3 IMPLANT
PREVENA RESTOR ARTHOFORM 46X30 (CANNISTER) ×3 IMPLANT
SPONGE T-LAP 18X18 ~~LOC~~+RFID (SPONGE) IMPLANT
STAPLER VISISTAT 35W (STAPLE) IMPLANT
STOCKINETTE IMPERVIOUS LG (DRAPES) ×3 IMPLANT
SUT ETHILON 2 0 PSLX (SUTURE) IMPLANT
SUT SILK 2 0 (SUTURE) ×2
SUT SILK 2-0 18XBRD TIE 12 (SUTURE) ×2 IMPLANT
SUT VIC AB 1 CTX 27 (SUTURE) ×6 IMPLANT
TOWEL GREEN STERILE (TOWEL DISPOSABLE) ×3 IMPLANT
TUBE CONNECTING 12X1/4 (SUCTIONS) ×3 IMPLANT
YANKAUER SUCT BULB TIP NO VENT (SUCTIONS) ×3 IMPLANT

## 2021-09-02 NOTE — Anesthesia Procedure Notes (Signed)
Procedure Name: LMA Insertion Date/Time: 09/02/2021 2:54 PM Performed by: Janene Harvey, CRNA Pre-anesthesia Checklist: Patient identified, Emergency Drugs available, Suction available and Patient being monitored Patient Re-evaluated:Patient Re-evaluated prior to induction Oxygen Delivery Method: Circle system utilized Preoxygenation: Pre-oxygenation with 100% oxygen Induction Type: IV induction LMA: LMA inserted LMA Size: 4.0 Placement Confirmation: positive ETCO2 Dental Injury: Teeth and Oropharynx as per pre-operative assessment

## 2021-09-02 NOTE — Anesthesia Preprocedure Evaluation (Addendum)
Anesthesia Evaluation  Patient identified by MRN, date of birth, ID band Patient awake    Reviewed: Allergy & Precautions, NPO status , Patient's Chart, lab work & pertinent test results  Airway Mallampati: II  TM Distance: >3 FB Neck ROM: Full    Dental  (+) Poor Dentition   Pulmonary Current Smoker and Patient abstained from smoking.,    Pulmonary exam normal breath sounds clear to auscultation       Cardiovascular hypertension, + CAD  Normal cardiovascular exam Rhythm:Regular Rate:Normal     Neuro/Psych PSYCHIATRIC DISORDERS  Neuromuscular disease    GI/Hepatic negative GI ROS, Neg liver ROS,   Endo/Other  diabetes  Renal/GU CRFRenal disease     Musculoskeletal   Abdominal (+) + obese,   Peds  Hematology  (+) Blood dyscrasia, anemia ,   Anesthesia Other Findings   Reproductive/Obstetrics                            Anesthesia Physical Anesthesia Plan  ASA: 3  Anesthesia Plan: General   Post-op Pain Management: Regional block* and Minimal or no pain anticipated   Induction: Intravenous  PONV Risk Score and Plan: 1 and Ondansetron and Treatment may vary due to age or medical condition  Airway Management Planned: LMA  Additional Equipment:   Intra-op Plan:   Post-operative Plan: Extubation in OR  Informed Consent: I have reviewed the patients History and Physical, chart, labs and discussed the procedure including the risks, benefits and alternatives for the proposed anesthesia with the patient or authorized representative who has indicated his/her understanding and acceptance.     Dental advisory given  Plan Discussed with: CRNA and Anesthesiologist  Anesthesia Plan Comments:        Anesthesia Quick Evaluation

## 2021-09-02 NOTE — Care Management Important Message (Signed)
Important Message  Patient Details  Name: [name] MRN: [id number] Date of Birth: [birth year]   Medicare Important Message Given:  Yes     [name] 09/02/2021, 3:45 PM

## 2021-09-02 NOTE — Anesthesia Procedure Notes (Signed)
Anesthesia Regional Block: Popliteal block   Pre-Anesthetic Checklist: , timeout performed,  Correct Patient, Correct Site, Correct Laterality,  Correct Procedure, Correct Position, site marked,  Risks and benefits discussed,  Surgical consent,  Pre-op evaluation,  At surgeon's request and post-op pain management  Laterality: Left  Prep: chloraprep       Needles:  Injection technique: Single-shot  Needle Type: Stimiplex     Needle Length: 9cm  Needle Gauge: 21     Additional Needles:   Procedures:,,,, ultrasound used (permanent image in chart),,    Narrative:  Start time: 09/02/2021 1:55 PM End time: 09/02/2021 2:00 PM Injection made incrementally with aspirations every 5 mL.  Performed by: Personally  Anesthesiologist: Lynda Rainwater, MD

## 2021-09-02 NOTE — Transfer of Care (Signed)
Immediate Anesthesia Transfer of Care Note  Patient: [name]  Procedure(s) Performed: LEFT BELOW KNEE AMPUTATION (Left: Knee)  Patient Location: PACU  Anesthesia Type:General and Regional  Level of Consciousness: drowsy and patient cooperative  Airway & Oxygen Therapy: Patient Spontanous Breathing and Patient connected to face mask oxygen  Post-op Assessment: Report given to RN and Post -op Vital signs reviewed and stable  Post vital signs: Reviewed and stable  Last Vitals:  Vitals Value Taken Time  BP 124/70 09/02/21 1532  Temp    Pulse 77 09/02/21 1534  Resp 19 09/02/21 1534  SpO2 98 % 09/02/21 1534  Vitals shown include unvalidated device data.  Last Pain:  Vitals:   09/02/21 1218  TempSrc:   PainSc: 0-No pain      Patients Stated Pain Goal: 3 (60/73/71 0626)  Complications: No notable events documented.

## 2021-09-02 NOTE — Progress Notes (Signed)
Patient ID: [name], male   DOB: November 11, 1970, 50 y.o.   MRN: [id number] S: No new complaints and ready for surgery. O:BP (!) 147/77 (BP Location: Left Arm)   Pulse 80   Temp 98.4 F (36.9 C) (Oral)   Resp 18   Ht $R'5\' 4"'UI$  (1.626 m)   Wt 87.5 kg   SpO2 100%   BMI 33.13 kg/m   Intake/Output Summary (Last 24 hours) at 09/02/2021 1135 Last data filed at 09/02/2021 0520 Gross per 24 hour  Intake 320 ml  Output 900 ml  Net -580 ml   Intake/Output: I/O last 3 completed shifts: In: 320 [P.O.:120; IV Piggyback:200] Out: 900 [Urine:900]  Intake/Output this shift:  No intake/output data recorded. Weight change:  Gen:NAD CVS:RRR Resp:CTA Abd: +BS, soft, NT/ND Ext: no edema  Recent Labs  Lab 08/29/21 1213 08/30/21 0520 08/31/21 0449 09/01/21 0138 09/02/21 0212  NA 134* 139 138 136 139  K 4.6 4.1 4.9 4.5 3.8  CL 107 113* 114* 113* 107  CO2 13* 15* 14* 16* 18*  GLUCOSE 250* 149* 233* 204* 28*  BUN 109* 103* 108* 113* 113*  CREATININE 5.21* 4.23* 3.84* 4.07* 3.78*  ALBUMIN 2.4* 2.0* 1.8* 1.6* 1.5*  CALCIUM 9.1 8.4* 8.3* 8.0* 7.9*  AST 31 22 47* 32 28  ALT 46* 30 43 41 31   Liver Function Tests: Recent Labs  Lab 08/31/21 0449 09/01/21 0138 09/02/21 0212  AST 47* 32 28  ALT 43 41 31  ALKPHOS 107 110 124  BILITOT 2.0* 1.3* 1.2  PROT 6.5 5.9* 5.9*  ALBUMIN 1.8* 1.6* 1.5*   No results for input(s): LIPASE, AMYLASE in the last 168 hours. No results for input(s): AMMONIA in the last 168 hours. CBC: Recent Labs  Lab 08/29/21 1200 08/30/21 0520 08/31/21 0449 09/01/21 0138 09/02/21 0212  WBC 42.6* 39.8* 50.9* 50.5* 45.5*  NEUTROABS 35.7*  --   --   --   --   HGB 11.2* 9.9* 10.2* 9.8* 9.6*  HCT 33.4* 29.7* 29.7* 27.5* 26.9*  MCV 83.3 81.6 80.1 77.5* 77.1*  PLT 461* 435* 478* 494* 489*   Cardiac Enzymes: No results for input(s): CKTOTAL, CKMB, CKMBINDEX, TROPONINI in the last 168 hours. CBG: Recent Labs  Lab 09/01/21 1722 09/01/21 2023 09/02/21 0502  09/02/21 0613 09/02/21 0813  GLUCAP 157* 123* 146* 108* 96    Iron Studies: No results for input(s): IRON, TIBC, TRANSFERRIN, FERRITIN in the last 72 hours. Studies/Results: US RENAL  Result Date: 09/01/2021 CLINICAL DATA:  Acute kidney injury EXAM: RENAL / URINARY TRACT ULTRASOUND COMPLETE COMPARISON:  CT 05/14/2020, ultrasound 01/08/2020 FINDINGS: Right Kidney: Renal measurements: 10.6 x 4.9 x 4.7 cm = volume: 126.7 mL. Echogenicity within normal limits. No mass or hydronephrosis visualized. Left Kidney: Renal measurements: 10.4 x 4.5 x 4.3 cm = volume: 105.9 mL. Echogenicity within normal limits. No mass or hydronephrosis visualized. Bladder: Appears normal for degree of bladder distention. Other: None. IMPRESSION: Negative renal ultrasound Electronically Signed   By: Donavan Foil M.D.   On: 09/01/2021 21:32   VAS [place] ABI WITH/WO TBI  Result Date: 08/31/2021  LOWER EXTREMITY DOPPLER STUDY Patient Name:  [name]  Date of Exam:   08/31/2021 Medical Rec #: [id number]         Accession #:    [id number] Date of Birth: November 20, 1970         Patient Gender: M Patient Age:   60 years Exam Location:  [facility] Procedure:  VAS [place] ABI WITH/WO TBI Referring Phys: DAVID ORTIZ --------------------------------------------------------------------------------  Indications: Ulceration. High Risk Factors: Hypertension, hyperlipidemia, Diabetes.  Vascular Interventions: 08/22/2016 - GREAT TOE AMPUTATION (Left: Toe)                          04/19/2021 - RIGHT FOOT CHOPART AMPUTATION. Limitations: Today's exam was limited due to involuntary patient movement,              bandages and an open wound. Comparison Study: 08/08/2020 - Right: Resting right ankle-brachial index is                   within normal range. No                   evidence of significant right lower extremity arterial                   disease.                    Left: Resting left ankle-brachial index is within normal                    range. No                   evidence of significant left lower extremity arterial disease Performing Technologist: Carlos Levering RVT  Examination Guidelines: A complete evaluation includes at minimum, Doppler waveform signals and systolic blood pressure reading at the level of bilateral brachial, anterior tibial, and posterior tibial arteries, when vessel segments are accessible. Bilateral testing is considered an integral part of a complete examination. Photoelectric Plethysmograph (PPG) waveforms and toe systolic pressure readings are included as required and additional duplex testing as needed. Limited examinations for reoccurring indications may be performed as noted.  ABI Findings: +---------+------------------+-----+----------+----------+ Right    Rt Pressure (mmHg)IndexWaveform  Comment    +---------+------------------+-----+----------+----------+ Brachial 119                    triphasic            +---------+------------------+-----+----------+----------+ PTA      128               1.04 monophasic           +---------+------------------+-----+----------+----------+ DP       88                0.72 monophasic           +---------+------------------+-----+----------+----------+ Great Toe                                 Amputation +---------+------------------+-----+----------+----------+ +---------+------------------+-----+----------+----------+ Left     Lt Pressure (mmHg)IndexWaveform  Comment    +---------+------------------+-----+----------+----------+ Brachial 123                    triphasic            +---------+------------------+-----+----------+----------+ PTA      64                0.52 monophasic           +---------+------------------+-----+----------+----------+ DP       113               0.92 monophasic           +---------+------------------+-----+----------+----------+ Great Toe  Amputation  +---------+------------------+-----+----------+----------+ +-------+-----------+-----------+------------+------------+ ABI/TBIToday's ABIToday's TBIPrevious ABIPrevious TBI +-------+-----------+-----------+------------+------------+ Right  1.04                                           +-------+-----------+-----------+------------+------------+ Left   0.92                                           +-------+-----------+-----------+------------+------------+  Summary: Right: Resting right ankle-brachial index is within normal range. No evidence of significant right lower extremity arterial disease. Values are likely falsely elevated due to medial calcification. Unable to obtain TBI due to great toe amputation. Left: Resting left ankle-brachial index indicates mild left lower extremity arterial disease. Values are likely falsely elevated due to medial calcification. Unable to obtain TBI due to great toe amputation. *See table(s) above for measurements and observations.  Electronically signed by Orlie Pollen on 08/31/2021 at 7:02:35 PM.    Final     amLODipine  10 mg Oral Daily   aspirin EC  81 mg Oral Daily   carvedilol  25 mg Oral BID WC   chlorhexidine   Topical Once   feeding supplement  237 mL Oral BID BM   ferrous sulfate  325 mg Oral Q breakfast   gabapentin  300 mg Oral QHS   insulin aspart  0-15 Units Subcutaneous Q4H   metroNIDAZOLE  500 mg Oral Q12H   multivitamin with minerals  1 tablet Oral Daily   mupirocin ointment  1 application. Nasal BID   nutrition supplement (JUVEN)  1 packet Oral BID BM    BMET    Component Value Date/Time   NA 139 09/02/2021 0212   NA 138 05/02/2020 0933   K 3.8 09/02/2021 0212   CL 107 09/02/2021 0212   CO2 18 (L) 09/02/2021 0212   GLUCOSE 28 (LL) 09/02/2021 0212   BUN 113 (H) 09/02/2021 0212   BUN 22 05/02/2020 0933   CREATININE 3.78 (H) 09/02/2021 0212   CREATININE 2.79 (H) 01/13/2021 0939   CREATININE 3.14 (H) 10/17/2020 1511    CALCIUM 7.9 (L) 09/02/2021 0212   GFRNONAA 18 (L) 09/02/2021 0212   GFRNONAA 27 (L) 01/13/2021 0939   GFRNONAA 29 (L) 09/04/2020 1545   GFRAA 33 (L) 09/04/2020 1545   CBC    Component Value Date/Time   WBC 45.5 (H) 09/02/2021 0212   RBC 3.49 (L) 09/02/2021 0212   HGB 9.6 (L) 09/02/2021 0212   HGB 10.3 (L) 01/13/2021 0939   HGB 10.8 (L) 06/24/2020 1055   HCT 26.9 (L) 09/02/2021 0212   HCT 33.5 (L) 06/24/2020 1055   PLT 489 (H) 09/02/2021 0212   PLT 310 01/13/2021 0939   PLT 437 06/24/2020 1055   MCV 77.1 (L) 09/02/2021 0212   MCV 84 06/24/2020 1055   MCH 27.5 09/02/2021 0212   MCHC 35.7 09/02/2021 0212   RDW 15.1 09/02/2021 0212   RDW 14.1 06/24/2020 1055   LYMPHSABS 1.7 08/29/2021 1200   LYMPHSABS 3.3 (H) 06/24/2020 1055   MONOABS 3.2 (H) 08/29/2021 1200   EOSABS 0.0 08/29/2021 1200   EOSABS 0.3 06/24/2020 1055   BASOSABS 0.2 (H) 08/29/2021 1200   BASOSABS 0.1 06/24/2020 1055   Assessment/Plan:  AKI/CKD stage IV - initial improvement but bump in Scr over last 24 hours.  He  is on Vanc and trough level was a little high at 20, so may be related to early vanc toxicity.  He is asymptomatic.  BP's were soft when he first came in so ATN possibility.  He also admitted taking ibuprofen at home prior to admission for foot pain.  BUN stable and Scr improved overnight to 3.78.  No indication for dialysis at this time and will order renal US and urine studies.  Continue to follow closely Renal dose meds Avoid nephrotoxic agents such as IV contrast, NSAIDs, phosphate containing bowel preps. Will decrease gabapentin to 300 mg qhs given eGFR of 17 ml/min. Preferred narcotic agents for pain control are hydromorphone, fentanyl, and  methadone. Morphine should not be used. Avoid Baclofen  Osteomyelitis of left foot - on vanco, cefepime, and flagyl.  Ortho for BKA today. Severe protein-calorie malnutrition. 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  Transfuse for hgb  <7.  Will likely need to start ESA. CAD - cont asa, BB.  Statin on hold due to elevated LFT's HTN - stable Non-anion gap metabolic acidosis - due to CKD.  Currently on bicarb, continue and follow.   Donetta Potts, MD [facility]

## 2021-09-02 NOTE — Interval H&P Note (Signed)
History and Physical Interval Note:  09/02/2021 6:44 AM  [name]  has presented today for surgery, with the diagnosis of Osteomyelitis Left Foot.  The various methods of treatment have been discussed with the patient and family. After consideration of risks, benefits and other options for treatment, the patient has consented to  Procedure(s): LEFT BELOW KNEE AMPUTATION (Left) as a surgical intervention.  The patient's history has been reviewed, patient examined, no change in status, stable for surgery.  I have reviewed the patient's chart and labs.  Questions were answered to the patient's satisfaction.     [name]

## 2021-09-02 NOTE — Progress Notes (Signed)
Lab Critical glucose-28. Pt resting soundly when approached, responsive to verbal stimuli. Oriented x4, reports "can't tell" blood glucose low. Hypoglycemic protocol initiated given 1 amp of D50 25g IV due to NPO status. CBG-146 at present. Jeneen Rinks Daniels-NP notified.

## 2021-09-02 NOTE — Progress Notes (Signed)
PROGRESS NOTE    [name]  HTD:[id number] DOB: 01-Dec-1970 DOA: 08/29/2021 PCP: Camillia Herter, NP    Chief Complaint  Patient presents with   Fall    Brief Narrative:  [name] is a 51 y.o. 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  He was evaluated by surgery who recommended transtibial amputation.   6/3: Admitted, Cr 5.2 on admission (from baseline 2.5); Podiatry evaluated, MRI showed osteo, no salvage operations were judged possible 6/5: Transferred to [name], ABIs done, inconclusive    Assessment & Plan:  Principal Problem:   Diabetic ulcer of left foot associated with type 2 diabetes mellitus ([name]) Active Problems:   Osteomyelitis of foot, acute ([name])   Acute kidney injury superimposed on CKD ([name])   Essential hypertension   Coronary atherosclerosis of native coronary artery   Anemia in chronic kidney disease (CKD)   Type 2 diabetes mellitus with hyperglycemia ([name])   Mixed hyperlipidemia   Severe protein-calorie malnutrition ([name])   Transaminitis   CKD (chronic kidney disease), stage IV ([name])    Assessment and Plan: * Diabetic ulcer of left foot associated with type 2 diabetes mellitus ([name])    Osteomyelitis of foot, acute ([name]) - 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  - Continue empiric antibiotics of vancomycin, cefepime and flagyl -Pain management per orthopedics - Continue nutritional supplements, vitamins  Acute kidney injury superimposed on CKD ([name]) Renal function 5.2 on admission, up from basleine around 2.5-2.8 since Jan. Here, was initially improving, but in last day, despite bicarb fluids, trending back up >4 mg/dL, in setting of vancomycin use. -Patient also noted to be taking NSAIDs prior to admission. - Patient seen in consultation by nephrology and  patient currently on bicarb. -Avoid nephrotoxic agents.  -Per nephrology.   CKD (chronic kidney disease), stage IV ([name]) Cr seems to have worsened since about Jan when he had endocarditis. Now 2.5-2.8 baseline. - Nephrology following.  Transaminitis -Total bilirubin has improved currently down to 1.2  -Follow.  Severe protein-calorie malnutrition ([name]) - Continue nutritional supplementation with Ensure.  Mixed hyperlipidemia - Recheck LFTs tomorrow and restart statin if AST/ALT normal  Type 2 diabetes mellitus with hyperglycemia ([name]) - Hemoglobin A1c 7.5 (08/30/2021 ). -CBG 28 of basic metabolic profile this morning.   -Patient noted to have received Semglee while n.p.o., which has been discontinued  -D5 normal saline at 20 cc an hour.  -Check CBGs every 4 hours for the next 24 hours and if blood glucose levels improve we will discontinue D5 normal saline. -SSI.  Anemia in chronic kidney disease (CKD) Hgb stable - Continue iron -Follow.  Iron deficiency anemia - Continue iron  Coronary atherosclerosis of native coronary artery - Continue aspirin, BB - Hold statin given transaminitis can resume tomorrow if LFTs have normalized.  Essential hypertension - Blood pressure controlled on current regimen of amlodipine, Coreg.   -Continue to hold home regimen clonidine.           DVT prophylaxis: Patient underwent surgery today. Code Status: Full Family Communication: Updated patient, no family at bedside Disposition: TBD  Status is: Inpatient Remains inpatient appropriate because: Severity of illness   Consultants:  Orthopedics: Dr. Sharol Given 08/31/2021 Podiatry: Dr. Jacqualyn Posey 08/29/2021 Nephrology: Dr. Marval Regal 09/01/2021  Procedures:  Plain films of the left foot 09/25/2021 MRI  left foot 08/29/2021 Renal ultrasound 09/01/2021 ABI bilateral 08/31/2021 Left BKA per orthopedics: Dr. Sharol Given 09/02/2021 Irrigation debridement left foot, bone biopsy.  Podiatry: Dr. Jacqualyn Posey  08/30/2021   Antimicrobials:  Oral Flagyl 08/29/2021>>>> 09/05/2021 IV cefepime 08/29/2021>>>> IV vancomycin 08/29/2021>>>     Subjective: Patient just returned from the OR.  No chest pain.  No shortness of breath.  No abdominal pain.  Patient noted to have a CBG of 28 this morning  Objective: Vitals:   09/02/21 1532 09/02/21 1547 09/02/21 1602 09/02/21 1648  BP: 124/70 120/69 118/72 130/78  Pulse: 76 74 73 73  Resp: '16 20 13 20  '$ Temp: (!) 97.5 F (36.4 C)  98.1 F (36.7 C) 97.6 F (36.4 C)  TempSrc:    Oral  SpO2: 98% 94% 92% 97%  Weight:      Height:        Intake/Output Summary (Last 24 hours) at 09/02/2021 1914 Last data filed at 09/02/2021 1512 Gross per 24 hour  Intake 420 ml  Output 1000 ml  Net -580 ml   Filed Weights   08/29/21 1125 09/02/21 1206  Weight: 87.5 kg 87.5 kg    Examination:  General exam: Appears calm and comfortable  Respiratory system: Clear to auscultation anterior lung fields.  No wheezes, no crackles, no rhonchi. Respiratory effort normal. Cardiovascular system: S1 & S2 heard, RRR. No JVD, murmurs, rubs, gallops or clicks. No pedal edema. Gastrointestinal system: Abdomen is nondistended, soft and nontender. No organomegaly or masses felt. Normal bowel sounds heard. Central nervous system: Alert and oriented. No focal neurological deficits. Extremities: Status post left BKA with postop bandage in place.  Status post right transmetatarsal amputation.  Skin: No rashes, lesions or ulcers Psychiatry: Judgement and insight appear normal. Mood & affect appropriate.     Data Reviewed:   CBC: Recent Labs  Lab 08/29/21 1200 08/30/21 0520 08/31/21 0449 09/01/21 0138 09/02/21 0212  WBC 42.6* 39.8* 50.9* 50.5* 45.5*  NEUTROABS 35.7*  --   --   --   --   HGB 11.2* 9.9* 10.2* 9.8* 9.6*  HCT 33.4* 29.7* 29.7* 27.5* 26.9*  MCV 83.3 81.6 80.1 77.5* 77.1*  PLT 461* 435* 478* 494* 489*    Basic Metabolic Panel: Recent Labs  Lab 08/29/21 1213  08/30/21 0520 08/31/21 0449 09/01/21 0138 09/02/21 0212  NA 134* 139 138 136 139  K 4.6 4.1 4.9 4.5 3.8  CL 107 113* 114* 113* 107  CO2 13* 15* 14* 16* 18*  GLUCOSE 250* 149* 233* 204* 28*  BUN 109* 103* 108* 113* 113*  CREATININE 5.21* 4.23* 3.84* 4.07* 3.78*  CALCIUM 9.1 8.4* 8.3* 8.0* 7.9*  MG  --   --  2.5* 2.3  --     GFR: Estimated Creatinine Clearance: 23.1 mL/min (A) (by C-G formula based on SCr of 3.78 mg/dL (H)).  Liver Function Tests: Recent Labs  Lab 08/29/21 1213 08/30/21 0520 08/31/21 0449 09/01/21 0138 09/02/21 0212  AST 31 22 47* 32 28  ALT 46* 30 43 41 31  ALKPHOS 110 95 107 110 124  BILITOT 2.4* 2.2* 2.0* 1.3* 1.2  PROT 7.6 6.4* 6.5 5.9* 5.9*  ALBUMIN 2.4* 2.0* 1.8* 1.6* 1.5*    CBG: Recent Labs  Lab 09/02/21 1230 09/02/21 1339 09/02/21 1441 09/02/21 1533 09/02/21 1648  GLUCAP 70 111* 88 106* 95     Recent Results (from the past 240 hour(s))  Blood culture (routine x 2)     Status: None (Preliminary result)   Collection  Time: 08/29/21 12:13 PM   Specimen: BLOOD  Result Value Ref Range Status   Specimen Description   Final    BLOOD RIGHT ANTECUBITAL Performed at [facility] [address] [place], [place] [address]    Special Requests   Final    BOTTLES DRAWN AEROBIC AND ANAEROBIC Blood Culture results may not be optimal due to an excessive volume of blood received in culture bottles Performed at [facility] [address] [place], [place] [address]    Culture   Final    NO GROWTH 4 DAYS Performed at [name], [place] [address], [place], [place] [address]    Report Status PENDING  Incomplete  Aerobic/Anaerobic Culture w Gram Stain (surgical/deep wound)     Status: None (Preliminary result)   Collection Time: 08/29/21 12:35 PM   Specimen: Wound  Result Value Ref Range Status   Specimen Description   Final    WOUND LEFT FOOT Performed at [facility]  [address], [place], [place] [address]    Special Requests   Final    NONE Performed at [facility], [place] [address], [place], [place] [address]    Gram Stain   Final    NO SQUAMOUS EPITHELIAL CELLS SEEN NO WBC SEEN MODERATE GRAM NEGATIVE RODS MODERATE GRAM POSITIVE COCCI Performed at [name], [place] [address], [place], [place] [address]    Culture   Final    ABUNDANT PROTEUS MIRABILIS MODERATE ENTEROCOCCUS FAECALIS NO ANAEROBES ISOLATED; CULTURE IN PROGRESS FOR 5 DAYS    Report Status PENDING  Incomplete   Organism ID, Bacteria PROTEUS MIRABILIS  Final   Organism ID, Bacteria ENTEROCOCCUS FAECALIS  Final      Susceptibility   Enterococcus faecalis - MIC*    AMPICILLIN <=2 SENSITIVE Sensitive     VANCOMYCIN 1 SENSITIVE Sensitive     GENTAMICIN SYNERGY SENSITIVE Sensitive     * MODERATE ENTEROCOCCUS FAECALIS   Proteus mirabilis - MIC*    AMPICILLIN <=2 SENSITIVE Sensitive     CEFAZOLIN <=4 SENSITIVE Sensitive     CEFEPIME <=0.12 SENSITIVE Sensitive     CEFTAZIDIME <=1 SENSITIVE Sensitive     CEFTRIAXONE <=0.25 SENSITIVE Sensitive     CIPROFLOXACIN <=0.25 SENSITIVE Sensitive     GENTAMICIN <=1 SENSITIVE Sensitive     IMIPENEM 2 SENSITIVE Sensitive     TRIMETH/SULFA <=20 SENSITIVE Sensitive     AMPICILLIN/SULBACTAM <=2 SENSITIVE Sensitive     PIP/TAZO <=4 SENSITIVE Sensitive     * ABUNDANT PROTEUS MIRABILIS  Blood culture (routine x 2)     Status: None (Preliminary result)   Collection Time: 08/29/21  6:10 PM   Specimen: BLOOD  Result Value Ref Range Status   Specimen Description   Final    BLOOD BLOOD RIGHT HAND Performed at [name], [place] [address] [place], [place] [address]    Special Requests   Final    BOTTLES DRAWN AEROBIC ONLY Blood Culture results may not be optimal due to an inadequate volume of blood received in culture bottles Performed at [facility] [address], [place],   [address]    Culture   Final    NO GROWTH 3 DAYS Performed at [name], [place] [address] [place],  [address]    Report Status PENDING  Incomplete  Aerobic/Anaerobic Culture w Gram Stain (surgical/deep wound)     Status: None (Preliminary result)  Collection Time: 08/30/21  9:53 AM   Specimen: Wound  Result Value Ref Range Status   Specimen Description   Final    WOUND LEFT FOOT WOUND Performed at [name], [address] [place], [place] [address]    Special Requests   Final    NONE Performed at [name], [place] [address], [place], [name] [address]    Gram Stain   Final    RARE WBC PRESENT, PREDOMINANTLY PMN MODERATE GRAM POSITIVE COCCI IN PAIRS MODERATE GRAM VARIABLE ROD    Culture   Final    MODERATE PROTEUS MIRABILIS FEW ENTEROCOCCUS FAECALIS HOLDING FOR POSSIBLE ANAEROBE Performed at [name], [place] [address] [place], [place] [address]    Report Status PENDING  Incomplete   Organism ID, Bacteria PROTEUS MIRABILIS  Final   Organism ID, Bacteria ENTEROCOCCUS FAECALIS  Final      Susceptibility   Enterococcus faecalis - MIC*    AMPICILLIN <=2 SENSITIVE Sensitive     VANCOMYCIN 1 SENSITIVE Sensitive     GENTAMICIN SYNERGY SENSITIVE Sensitive     * FEW ENTEROCOCCUS FAECALIS   Proteus mirabilis - MIC*    AMPICILLIN <=2 SENSITIVE Sensitive     CEFAZOLIN <=4 SENSITIVE Sensitive     CEFEPIME <=0.12 SENSITIVE Sensitive     CEFTAZIDIME <=1 SENSITIVE Sensitive     CEFTRIAXONE <=0.25 SENSITIVE Sensitive     CIPROFLOXACIN <=0.25 SENSITIVE Sensitive     GENTAMICIN <=1 SENSITIVE Sensitive     IMIPENEM 2 SENSITIVE Sensitive     TRIMETH/SULFA <=20 SENSITIVE Sensitive     AMPICILLIN/SULBACTAM <=2 SENSITIVE Sensitive     PIP/TAZO <=4 SENSITIVE Sensitive     * MODERATE PROTEUS MIRABILIS  Surgical PCR screen     Status: None   Collection Time: 09/02/21  5:30 AM   Specimen: Nasal Mucosa; Nasal Swab  Result Value Ref Range  Status   MRSA, PCR NEGATIVE NEGATIVE Final   Staphylococcus aureus NEGATIVE NEGATIVE Final    Comment: (NOTE) The Xpert SA Assay (FDA approved for NASAL specimens in patients 51 years of age and older), is one component of a comprehensive surveillance program. It is not intended to diagnose infection nor to guide or monitor treatment. Performed at [name], [place] [address] [place], [place] [address]          Radiology Studies: US RENAL  Result Date: 09/01/2021 CLINICAL DATA:  Acute kidney injury EXAM: RENAL / URINARY TRACT ULTRASOUND COMPLETE COMPARISON:  CT 05/14/2020, ultrasound 01/08/2020 FINDINGS: Right Kidney: Renal measurements: 10.6 x 4.9 x 4.7 cm = volume: 126.7 mL. Echogenicity within normal limits. No mass or hydronephrosis visualized. Left Kidney: Renal measurements: 10.4 x 4.5 x 4.3 cm = volume: 105.9 mL. Echogenicity within normal limits. No mass or hydronephrosis visualized. Bladder: Appears normal for degree of bladder distention. Other: None. IMPRESSION: Negative renal ultrasound Electronically Signed   By: Donavan Foil M.D.   On: 09/01/2021 21:32        Scheduled Meds:  amLODipine  10 mg Oral Daily   vitamin C  1,000 mg Oral Daily   aspirin EC  81 mg Oral Daily   carvedilol  25 mg Oral BID WC   [START ON 09/03/2021] docusate sodium  100 mg Oral Daily   feeding supplement  237 mL Oral BID BM   fentaNYL       ferrous sulfate  325 mg Oral Q breakfast   gabapentin  300 mg Oral QHS  insulin aspart  0-15 Units Subcutaneous Q4H   metroNIDAZOLE  500 mg Oral Q12H   midazolam       multivitamin with minerals  1 tablet Oral Daily   mupirocin ointment  1 application. Nasal BID   nutrition supplement (JUVEN)  1 packet Oral BID BM   pantoprazole  40 mg Oral Daily   zinc sulfate  220 mg Oral Daily   Continuous Infusions:  sodium chloride Stopped (09/02/21 1711)    ceFAZolin (ANCEF) IV     ceFEPime (MAXIPIME) IV 200 mL/hr at 09/02/21 1600   dextrose 5 % and  0.9% NaCl 20 mL/hr at 09/02/21 1030   magnesium sulfate bolus IVPB     sodium bicarbonate in 0.45 NS mL infusion 100 mL/hr at 09/02/21 1713   vancomycin 750 mg (09/02/21 1024)     LOS: 4 days    Time spent: 35 minutes    Irine Seal, MD [facility]   To contact the attending provider between 7A-7P or the covering provider during after hours 7P-7A, please log into the web site [email or web address] and access using universal [place] password for that web site. If you do not have the password, please call the [name] operator.  09/02/2021, 7:14 PM

## 2021-09-02 NOTE — Progress Notes (Addendum)
CBG-108 at this time, Jamison Neighbor notified of updated result.

## 2021-09-02 NOTE — Op Note (Signed)
09/02/2021  3:52 PM  PATIENT:  [name]    PRE-OPERATIVE DIAGNOSIS:  Osteomyelitis Left Foot  POST-OPERATIVE DIAGNOSIS: Abscess extended up to the popliteal fossa along the medial gastrocnemius and medial head of the soleus.  PROCEDURE:  LEFT BELOW KNEE AMPUTATION  SURGEON:  [name], MD  PHYSICIAN ASSISTANT:None ANESTHESIA:   General  PREOPERATIVE INDICATIONS:  [name] is a  51 y.o. male with a diagnosis of Osteomyelitis Left Foot who failed conservative measures and elected for surgical management.    The risks benefits and alternatives were discussed with the patient preoperatively including but not limited to the risks of infection, bleeding, nerve injury, cardiopulmonary complications, the need for revision surgery, among others, and the patient was willing to proceed.  OPERATIVE IMPLANTS: Wound was packed open.  Tissue and purulence sent for cultures.  '@ENCIMAGES'$ @  OPERATIVE FINDINGS: Patient had a purulent abscess that extended up to the popliteal fossa with necrotic muscle along the medial head of the gastrocnemius muscle and medial soleus.  Tissue was sent for cultures.  OPERATIVE PROCEDURE: Patient was brought the operating room and underwent a general anesthetic.  After adequate levels anesthesia were obtained patient's left lower extremity was prepped using DuraPrep draped into a sterile field a timeout was called.  A transverse incision was made 12 cm distal to the tibial tubercle this curved proximally and a large posterior flap was created.  The tibia and fibula resected 1 cm proximal to the skin incision.  Upon incising the tibia and fibula there was a large purulent abscess encountered medial to the tibia.  The amputation was completed with a large posterior flap the sciatic nerve was pulled cut and allowed to retract the popliteal vessels and the anterior compartment vessels were suture-ligated with 2-0 silk.  The Esmarch was released.  The  necrotic muscle of the medial gastrocnemius muscle and the medial soleus muscle was resected and this was sent for cultures.  The wound was irrigated with normal saline.  The wound was packed open with Kerlix and cover with an Ace wrap.  Patient was extubated taken the PACU in stable condition   DISCHARGE PLANNING:  Antibiotic duration: Continue IV antibiotics adjust according to culture sensitivities  Weightbearing: Nonweightbearing on the left  Pain medication: Opioid pathway  Dressing care/ Wound VAC: Reinforce dressing as needed  Ambulatory devices: Walker or crutches  Discharge to: Plan for return to the operating room on Friday.  Follow-up: In the office 1 week post operative.

## 2021-09-02 NOTE — Anesthesia Procedure Notes (Signed)
Anesthesia Regional Block: Adductor canal block   Pre-Anesthetic Checklist: , timeout performed,  Correct Patient, Correct Site, Correct Laterality,  Correct Procedure, Correct Position, site marked,  Risks and benefits discussed,  Surgical consent,  Pre-op evaluation,  At surgeon's request and post-op pain management  Laterality: Left  Prep: chloraprep       Needles:  Injection technique: Single-shot  Needle Type: Stimiplex     Needle Length: 9cm  Needle Gauge: 21     Additional Needles:   Procedures:,,,, ultrasound used (permanent image in chart),,    Narrative:  Start time: 09/02/2021 1:54 PM End time: 09/02/2021 1:59 PM Injection made incrementally with aspirations every 5 mL.  Performed by: Personally  Anesthesiologist: Lynda Rainwater, MD

## 2021-09-02 NOTE — Progress Notes (Signed)
   Inpatient Rehab Admissions Coordinator :  Rehab consult received. Current payor trends with [facility] are they will not approve CIR/AIR level rehab for an amputation. Other rehab venue options recommended to be pursued. We will sign off. Please contact me with any questions.  Danne Baxter RN MSN Admissions Coordinator [phone]

## 2021-09-03 ENCOUNTER — Encounter (HOSPITAL_COMMUNITY): Payer: Self-pay | Admitting: Orthopedic Surgery

## 2021-09-03 DIAGNOSIS — L97421 Non-pressure chronic ulcer of left heel and midfoot limited to breakdown of skin: Secondary | ICD-10-CM | POA: Diagnosis not present

## 2021-09-03 DIAGNOSIS — M869 Osteomyelitis, unspecified: Secondary | ICD-10-CM | POA: Diagnosis not present

## 2021-09-03 DIAGNOSIS — E1165 Type 2 diabetes mellitus with hyperglycemia: Secondary | ICD-10-CM | POA: Diagnosis not present

## 2021-09-03 DIAGNOSIS — E11621 Type 2 diabetes mellitus with foot ulcer: Secondary | ICD-10-CM | POA: Diagnosis not present

## 2021-09-03 DIAGNOSIS — N184 Chronic kidney disease, stage 4 (severe): Secondary | ICD-10-CM | POA: Diagnosis not present

## 2021-09-03 DIAGNOSIS — Z794 Long term (current) use of insulin: Secondary | ICD-10-CM

## 2021-09-03 DIAGNOSIS — N179 Acute kidney failure, unspecified: Secondary | ICD-10-CM | POA: Diagnosis not present

## 2021-09-03 LAB — COMPREHENSIVE METABOLIC PANEL
ALT: 36 U/L (ref 0–44)
AST: 42 U/L — ABNORMAL HIGH (ref 15–41)
Albumin: <1.5 g/dL — ABNORMAL LOW (ref 3.5–5.0)
Alkaline Phosphatase: 101 U/L (ref 38–126)
Anion gap: 8 (ref 5–15)
BUN: 112 mg/dL — ABNORMAL HIGH (ref 6–20)
CO2: 18 mmol/L — ABNORMAL LOW (ref 22–32)
Calcium: 7.4 mg/dL — ABNORMAL LOW (ref 8.9–10.3)
Chloride: 110 mmol/L (ref 98–111)
Creatinine, Ser: 3.69 mg/dL — ABNORMAL HIGH (ref 0.61–1.24)
GFR, Estimated: 19 mL/min — ABNORMAL LOW (ref >60)
Glucose, Bld: 231 mg/dL — ABNORMAL HIGH (ref 70–99)
Potassium: 4.9 mmol/L (ref 3.5–5.1)
Sodium: 136 mmol/L (ref 135–145)
Total Bilirubin: 1 mg/dL (ref 0.3–1.2)
Total Protein: 5.7 g/dL — ABNORMAL LOW (ref 6.5–8.1)

## 2021-09-03 LAB — CULTURE, BLOOD (ROUTINE X 2): Culture: NO GROWTH

## 2021-09-03 LAB — CBC
HCT: 28.7 % — ABNORMAL LOW (ref 39.0–52.0)
Hemoglobin: 10.1 g/dL — ABNORMAL LOW (ref 13.0–17.0)
MCH: 27.7 pg (ref 26.0–34.0)
MCHC: 35.2 g/dL (ref 30.0–36.0)
MCV: 78.6 fL — ABNORMAL LOW (ref 80.0–100.0)
Platelets: 463 10*3/uL — ABNORMAL HIGH (ref 150–400)
RBC: 3.65 MIL/uL — ABNORMAL LOW (ref 4.22–5.81)
RDW: 15.5 % (ref 11.5–15.5)
WBC: 46.8 10*3/uL — ABNORMAL HIGH (ref 4.0–10.5)
nRBC: 0 % (ref 0.0–0.2)

## 2021-09-03 LAB — GLUCOSE, CAPILLARY
Glucose-Capillary: 147 mg/dL — ABNORMAL HIGH (ref 70–99)
Glucose-Capillary: 155 mg/dL — ABNORMAL HIGH (ref 70–99)
Glucose-Capillary: 194 mg/dL — ABNORMAL HIGH (ref 70–99)
Glucose-Capillary: 202 mg/dL — ABNORMAL HIGH (ref 70–99)
Glucose-Capillary: 209 mg/dL — ABNORMAL HIGH (ref 70–99)
Glucose-Capillary: 224 mg/dL — ABNORMAL HIGH (ref 70–99)

## 2021-09-03 LAB — VANCOMYCIN, RANDOM: Vancomycin Rm: 19 ug/mL

## 2021-09-03 LAB — AEROBIC/ANAEROBIC CULTURE W GRAM STAIN (SURGICAL/DEEP WOUND): Gram Stain: NONE SEEN

## 2021-09-03 LAB — SEDIMENTATION RATE: Sed Rate: 75 mm/hr — ABNORMAL HIGH (ref 0–16)

## 2021-09-03 LAB — C-REACTIVE PROTEIN: CRP: 19.9 mg/dL — ABNORMAL HIGH (ref <1.0)

## 2021-09-03 MED ORDER — POVIDONE-IODINE 10 % EX SWAB
2.0000 "application " | Freq: Once | CUTANEOUS | Status: AC
  Administered 2021-09-04: 2 via TOPICAL

## 2021-09-03 MED ORDER — CEFAZOLIN SODIUM-DEXTROSE 2-4 GM/100ML-% IV SOLN
2.0000 g | Freq: Two times a day (BID) | INTRAVENOUS | Status: DC
  Administered 2021-09-03 – 2021-09-05 (×6): 2 g via INTRAVENOUS
  Filled 2021-09-03 (×8): qty 100

## 2021-09-03 MED ORDER — SODIUM CHLORIDE 0.9 % IV SOLN
8.0000 mg/kg | INTRAVENOUS | Status: AC
  Administered 2021-09-03 – 2021-09-11 (×5): 550 mg via INTRAVENOUS
  Filled 2021-09-03 (×5): qty 11

## 2021-09-03 MED ORDER — CHLORHEXIDINE GLUCONATE 4 % EX LIQD: 60.0000 mL | Freq: Once | CUTANEOUS | Status: DC

## 2021-09-03 MED ORDER — INSULIN GLARGINE-YFGN 100 UNIT/ML ~~LOC~~ SOLN: 8.0000 [IU] | Freq: Every day | SUBCUTANEOUS | Status: DC

## 2021-09-03 MED ORDER — INSULIN GLARGINE-YFGN 100 UNIT/ML ~~LOC~~ SOLN
10.0000 [IU] | Freq: Every day | SUBCUTANEOUS | Status: DC
  Administered 2021-09-03: 10 [IU] via SUBCUTANEOUS
  Filled 2021-09-03 (×2): qty 0.1

## 2021-09-03 NOTE — Plan of Care (Signed)
  Problem: Education: Goal: Knowledge of General Education information will improve Description: Including pain rating scale, medication(s)/side effects and non-pharmacologic comfort measures Outcome: Progressing   Problem: Health Behavior/Discharge Planning: Goal: Ability to manage health-related needs will improve Outcome: Progressing   Problem: Clinical Measurements: Goal: Ability to maintain clinical measurements within normal limits will improve Outcome: Progressing Goal: Will remain free from infection Outcome: Progressing Goal: Diagnostic test results will improve Outcome: Progressing Goal: Respiratory complications will improve Outcome: Progressing Goal: Cardiovascular complication will be avoided Outcome: Progressing   Problem: Nutrition: Goal: Adequate nutrition will be maintained Outcome: Progressing   Problem: Coping: Goal: Level of anxiety will decrease Outcome: Progressing   Problem: Elimination: Goal: Will not experience complications related to bowel motility Outcome: Progressing Goal: Will not experience complications related to urinary retention Outcome: Progressing   Problem: Education: Goal: Ability to describe self-care measures that may prevent or decrease complications (Diabetes Survival Skills Education) will improve Outcome: Progressing Goal: Individualized Educational Video(s) Outcome: Progressing   Problem: Coping: Goal: Ability to adjust to condition or change in health will improve Outcome: Progressing

## 2021-09-03 NOTE — Progress Notes (Signed)
Patient ID: [name], male   DOB: Aug 22, 1970, 51 y.o.   MRN: [id number] S: "I don't know how to feel to tell you the truth".  He tolerated surgery well. O:BP 136/89 (BP Location: Right Arm)   Pulse 72   Temp (!) 97.5 F (36.4 C) (Oral)   Resp 17   Ht _0  (1.626 m)   Wt 87.5 kg   SpO2 100%   BMI 33.11 kg/m   Intake/Output Summary (Last 24 hours) at 09/03/2021 1046 Last data filed at 09/03/2021 1006 Gross per 24 hour  Intake 465.78 ml  Output 1500 ml  Net -1034.22 ml   Intake/Output: I/O last 3 completed shifts: In: 545.8 [P.O.:120; I.V.:125.8; IV Piggyback:300] Out: 2400 [Urine:2300; Blood:100]  Intake/Output this shift:  Total I/O In: 240 [P.O.:240] Out: -  Weight change:  Gen:NAD CVS: RRR Resp:CTA Abd: +BS, soft, NT/Nd Ext: s/p LBKA, s/p right TMA, no edema  Recent Labs  Lab 08/29/21 1213 08/30/21 0520 08/31/21 0449 09/01/21 0138 09/02/21 0212 09/03/21 0149  NA 134* 139 138 136 139 136  K 4.6 4.1 4.9 4.5 3.8 4.9  CL 107 113* 114* 113* 107 110  CO2 13* 15* 14* 16* 18* 18*  GLUCOSE 250* 149* 233* 204* 28* 231*  BUN 109* 103* 108* 113* 113* 112*  CREATININE 5.21* 4.23* 3.84* 4.07* 3.78* 3.69*  ALBUMIN 2.4* 2.0* 1.8* 1.6* 1.5* <1.5*  CALCIUM 9.1 8.4* 8.3* 8.0* 7.9* 7.4*  AST 31 22 47* 32 28 42*  ALT 46* 30 43 41 31 36   Liver Function Tests: Recent Labs  Lab 09/01/21 0138 09/02/21 0212 09/03/21 0149  AST 32 28 42*  ALT 41 31 36  ALKPHOS 110 124 101  BILITOT 1.3* 1.2 1.0  PROT 5.9* 5.9* 5.7*  ALBUMIN 1.6* 1.5* <1.5*   No results for input(s): "LIPASE", "AMYLASE" in the last 168 hours. No results for input(s): "AMMONIA" in the last 168 hours. CBC: Recent Labs  Lab 08/29/21 1200 08/30/21 0520 08/31/21 0449 09/01/21 0138 09/02/21 0212 09/03/21 0149  WBC 42.6* 39.8* 50.9* 50.5* 45.5* 46.8*  NEUTROABS 35.7*  --   --   --   --   --   HGB 11.2* 9.9* 10.2* 9.8* 9.6* 10.1*  HCT 33.4* 29.7* 29.7* 27.5* 26.9* 28.7*  MCV 83.3 81.6 80.1 77.5*  77.1* 78.6*  PLT 461* 435* 478* 494* 489* 463*   Cardiac Enzymes: No results for input(s): "CKTOTAL", "CKMB", "CKMBINDEX", "TROPONINI" in the last 168 hours. CBG: Recent Labs  Lab 09/02/21 1648 09/02/21 2043 09/02/21 2359 09/03/21 0540 09/03/21 0845  GLUCAP 95 206* 224* 209* 194*    Iron Studies: No results for input(s): "IRON", "TIBC", "TRANSFERRIN", "FERRITIN" in the last 72 hours. Studies/Results: US RENAL  Result Date: 09/01/2021 CLINICAL DATA:  Acute kidney injury EXAM: RENAL / URINARY TRACT ULTRASOUND COMPLETE COMPARISON:  CT 05/14/2020, ultrasound 01/08/2020 FINDINGS: Right Kidney: Renal measurements: 10.6 x 4.9 x 4.7 cm = volume: 126.7 mL. Echogenicity within normal limits. No mass or hydronephrosis visualized. Left Kidney: Renal measurements: 10.4 x 4.5 x 4.3 cm = volume: 105.9 mL. Echogenicity within normal limits. No mass or hydronephrosis visualized. Bladder: Appears normal for degree of bladder distention. Other: None. IMPRESSION: Negative renal ultrasound Electronically Signed   By: Donavan Foil M.D.   On: 09/01/2021 21:32    amLODipine  10 mg Oral Daily   vitamin C  1,000 mg Oral Daily   aspirin EC  81 mg Oral Daily   carvedilol  25 mg Oral  BID WC   docusate sodium  100 mg Oral Daily   feeding supplement  237 mL Oral BID BM   ferrous sulfate  325 mg Oral Q breakfast   gabapentin  300 mg Oral QHS   insulin aspart  0-15 Units Subcutaneous Q4H   insulin glargine-yfgn  10 Units Subcutaneous Daily   multivitamin with minerals  1 tablet Oral Daily   mupirocin ointment  1 application. Nasal BID   nutrition supplement (JUVEN)  1 packet Oral BID BM   pantoprazole  40 mg Oral Daily   zinc sulfate  220 mg Oral Daily    BMET    Component Value Date/Time   NA 136 09/03/2021 0149   NA 138 05/02/2020 0933   K 4.9 09/03/2021 0149   CL 110 09/03/2021 0149   CO2 18 (L) 09/03/2021 0149   GLUCOSE 231 (H) 09/03/2021 0149   BUN 112 (H) 09/03/2021 0149   BUN 22 05/02/2020 0933    CREATININE 3.69 (H) 09/03/2021 0149   CREATININE 2.79 (H) 01/13/2021 0939   CREATININE 3.14 (H) 10/17/2020 1511   CALCIUM 7.4 (L) 09/03/2021 0149   GFRNONAA 19 (L) 09/03/2021 0149   GFRNONAA 27 (L) 01/13/2021 0939   GFRNONAA 29 (L) 09/04/2020 1545   GFRAA 33 (L) 09/04/2020 1545   CBC    Component Value Date/Time   WBC 46.8 (H) 09/03/2021 0149   RBC 3.65 (L) 09/03/2021 0149   HGB 10.1 (L) 09/03/2021 0149   HGB 10.3 (L) 01/13/2021 0939   HGB 10.8 (L) 06/24/2020 1055   HCT 28.7 (L) 09/03/2021 0149   HCT 33.5 (L) 06/24/2020 1055   PLT 463 (H) 09/03/2021 0149   PLT 310 01/13/2021 0939   PLT 437 06/24/2020 1055   MCV 78.6 (L) 09/03/2021 0149   MCV 84 06/24/2020 1055   MCH 27.7 09/03/2021 0149   MCHC 35.2 09/03/2021 0149   RDW 15.5 09/03/2021 0149   RDW 14.1 06/24/2020 1055   LYMPHSABS 1.7 08/29/2021 1200   LYMPHSABS 3.3 (H) 06/24/2020 1055   MONOABS 3.2 (H) 08/29/2021 1200   EOSABS 0.0 08/29/2021 1200   EOSABS 0.3 06/24/2020 1055   BASOSABS 0.2 (H) 08/29/2021 1200   BASOSABS 0.1 06/24/2020 1055   Assessment/Plan:  AKI/CKD stage IV, non-oliguric - BUN/Cr slowly improving.  He is on Vanc and trough level was a little high at 20, so may be related to early vanc toxicity.  Last dose given 09/02/21.  Will recheck level today.  He is asymptomatic.  BP's were soft when he first came in so ATN possibility.  He also admitted taking ibuprofen at home prior to admission for foot pain.  BUN stable and Scr improved overnight to 3.69.  No indication for dialysis at this time and will order renal US and urine studies.  Continue to follow closely Renal dose meds Avoid nephrotoxic agents such as IV contrast, NSAIDs, phosphate containing bowel preps. Will decrease gabapentin to 300 mg qhs given eGFR of 17 ml/min. Preferred narcotic agents for pain control are hydromorphone, fentanyl, and  methadone. Morphine should not be used. 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  ID consulted and recommended to change to daptomycin pluse cefazolin for 4 weeks.  No PICC but ok for tunneled central line for prolonged Abx.  Stopping vancomycin.  Severe protein-calorie malnutrition. 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  Transfuse for hgb <7.  Will  likely need to start ESA. CAD - cont asa, BB.  Statin on hold due to elevated LFT's HTN - stable Non-anion gap metabolic acidosis - due to CKD.  Currently on bicarb, continue and follow.   Donetta Potts, MD [facility]

## 2021-09-03 NOTE — Progress Notes (Signed)
PROGRESS NOTE    [name]  TFT:[id number] DOB: 03/27/1971 DOA: 08/29/2021 PCP: Camillia Herter, NP    Chief Complaint  Patient presents with   Fall    Brief Narrative:  [name] is a 51 y.o. 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  He was evaluated by surgery who recommended transtibial amputation.   6/3: Admitted, Cr 5.2 on admission (from baseline 2.5); Podiatry evaluated, MRI showed osteo, no salvage operations were judged possible 6/5: Transferred to [facility], ABIs done, inconclusive    Assessment & Plan:  Principal Problem:   Diabetic ulcer of left foot associated with type 2 diabetes mellitus ([place]) Active Problems:   Osteomyelitis of foot, acute ([place])   Acute kidney injury superimposed on CKD ([place])   Essential hypertension   Coronary atherosclerosis of native coronary artery   Anemia in chronic kidney disease (CKD)   Type 2 diabetes mellitus with hyperglycemia ([place])   Mixed hyperlipidemia   Severe protein-calorie malnutrition ([name])   Transaminitis   CKD (chronic kidney disease), stage IV ([name])    Assessment and Plan: * Diabetic ulcer of left foot associated with type 2 diabetes mellitus ([place])    Osteomyelitis of foot, acute ([place]) - 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  -Op note with findings of purulent abscess that extended up to the popliteal fossa with necrotic muscle along the medial head of the gastrocnemius muscle and medial soleus.   -ID consulted and recommended discontinuation of IV vancomycin and IV cefepime and placing patient on daptomycin plus cefazolin.  -ID recommending at least 4 weeks of IV antibiotics and recommending to check a sed rate and CRP. -Due to renal function unable to place a PICC at this time, nephrology recommending tunneled central line for  prolonged antibiotics and in agreement with stopping vancomycin. -Appreciate ID input and recommendations as well as orthopedics input and recommendations. -Patient for probable procedure tomorrow per orthopedics. - Continue nutritional supplements, vitamins  Acute kidney injury superimposed on CKD ([place]) Renal function 5.2 on admission, up from basleine around 2.5-2.8 since Jan. Here, was initially improving, but in last day, despite bicarb fluids, trending back up >4 mg/dL, in setting of vancomycin use. -Patient also noted to be taking NSAIDs prior to admission. - Patient seen in consultation by nephrology and patient currently on bicarb. -Renal function with a creatinine down to 3.69 today. -Avoid nephrotoxic agents.  -Per nephrology.   CKD (chronic kidney disease), stage IV ([place]) Cr seems to have worsened since about Jan when he had endocarditis. Now 2.5-2.8 baseline. - Nephrology following.  Transaminitis -Total bilirubin has improved currently down to 1.2  -Follow.  Severe protein-calorie malnutrition ([place]) - Continue nutritional supplementation with Ensure.  Mixed hyperlipidemia - Repeat LFTs tomorrow, if stable may resume statin.  Type 2 diabetes mellitus with hyperglycemia ([name]) - Hemoglobin A1c 7.5 (08/30/2021 ). -CBG 28 of basic metabolic profile the morning of 09/02/2021. -Patient noted to have received Semglee while n.p.o., which has been discontinued. -CBGs have improved and CBG of 209 this morning. -Discontinue D5 normal saline. -Placed on Semglee 10 units daily.  SSI. -Follow CBGs.  Anemia in chronic kidney disease (CKD) Hgb stable - Continue iron -Follow.  Iron deficiency anemia - Continue iron  Coronary atherosclerosis of native coronary artery - Continue aspirin, BB - Hold statin  given transaminitis can resume tomorrow if LFTs have normalized.  Essential hypertension - Blood pressure controlled on current regimen of amlodipine, Coreg.   -Continue to  hold home regimen clonidine.           DVT prophylaxis: Per orthopedics, patient back to the OR tomorrow potentially. Code Status: Full Family Communication: Updated patient, no family at bedside Disposition: TBD  Status is: Inpatient Remains inpatient appropriate because: Severity of illness   Consultants:  Orthopedics: Dr. Sharol Given 08/31/2021 Podiatry: Dr. Jacqualyn Posey 08/29/2021 Nephrology: Dr. Marval Regal 09/01/2021 Infectious disease: Dr. Baxter Flattery 09/03/2021  Procedures:  Plain films of the left foot 09/25/2021 MRI left foot 08/29/2021 Renal ultrasound 09/01/2021 ABI bilateral 08/31/2021 Left BKA per orthopedics: Dr. Sharol Given 09/02/2021 Irrigation debridement left foot, bone biopsy.  Podiatry: Dr. Jacqualyn Posey 08/30/2021   Antimicrobials:  Oral Flagyl 08/29/2021>>>> 09/05/2021 IV cefepime 08/29/2021>>>> 09/03/2021 IV vancomycin 08/29/2021>>> 09/03/2021 IV daptomycin 09/03/2021>>>> IV Ancef 09/03/2021>>>>>    Subjective: Sitting up in bed.  Stated he really did not eat that much for breakfast.  Waiting on lunch to be ordered.  No chest pain.  No shortness of breath.  No abdominal pain.   Objective: Vitals:   09/02/21 2355 09/03/21 0646 09/03/21 0834 09/03/21 1549  BP: (!) 159/88 (!) 143/92 136/89 140/79  Pulse: 79 76 72 70  Resp: '20 18 17 17  '$ Temp: 97.6 F (36.4 C) (!) 97.5 F (36.4 C) (!) 97.5 F (36.4 C) 97.7 F (36.5 C)  TempSrc: Oral Oral Oral Oral  SpO2: 100% 100% 100% 100%  Weight:      Height:        Intake/Output Summary (Last 24 hours) at 09/03/2021 1721 Last data filed at 09/03/2021 1505 Gross per 24 hour  Intake 1105.78 ml  Output 1400 ml  Net -294.22 ml   Filed Weights   08/29/21 1125 09/02/21 1206  Weight: 87.5 kg 87.5 kg    Examination:  General exam: NAD Respiratory system: CTA B.  No wheezes, no crackles, no rhonchi.  Fair air movement.  Speaking in full sentences.  Cardiovascular system: RRR no murmurs rubs or gallops.  No JVD.  No lower extremity edema.  Gastrointestinal  system: Abdomen is nondistended, soft and nontender. No organomegaly or masses felt. Normal bowel sounds heard. Central nervous system: Alert and oriented. No focal neurological deficits. Extremities: Status post left BKA with postop bandage in place.  Status post right transmetatarsal amputation.  Skin: No rashes, lesions or ulcers Psychiatry: Judgement and insight appear normal. Mood & affect appropriate.     Data Reviewed:   CBC: Recent Labs  Lab 08/29/21 1200 08/30/21 0520 08/31/21 0449 09/01/21 0138 09/02/21 0212 09/03/21 0149  WBC 42.6* 39.8* 50.9* 50.5* 45.5* 46.8*  NEUTROABS 35.7*  --   --   --   --   --   HGB 11.2* 9.9* 10.2* 9.8* 9.6* 10.1*  HCT 33.4* 29.7* 29.7* 27.5* 26.9* 28.7*  MCV 83.3 81.6 80.1 77.5* 77.1* 78.6*  PLT 461* 435* 478* 494* 489* 463*    Basic Metabolic Panel: Recent Labs  Lab 08/30/21 0520 08/31/21 0449 09/01/21 0138 09/02/21 0212 09/03/21 0149  NA 139 138 136 139 136  K 4.1 4.9 4.5 3.8 4.9  CL 113* 114* 113* 107 110  CO2 15* 14* 16* 18* 18*  GLUCOSE 149* 233* 204* 28* 231*  BUN 103* 108* 113* 113* 112*  CREATININE 4.23* 3.84* 4.07* 3.78* 3.69*  CALCIUM 8.4* 8.3* 8.0* 7.9* 7.4*  MG  --  2.5* 2.3  --   --  GFR: Estimated Creatinine Clearance: 23.6 mL/min (A) (by C-G formula based on SCr of 3.69 mg/dL (H)).  Liver Function Tests: Recent Labs  Lab 08/30/21 0520 08/31/21 0449 09/01/21 0138 09/02/21 0212 09/03/21 0149  AST 22 47* 32 28 42*  ALT 30 43 41 31 36  ALKPHOS 95 107 110 124 101  BILITOT 2.2* 2.0* 1.3* 1.2 1.0  PROT 6.4* 6.5 5.9* 5.9* 5.7*  ALBUMIN 2.0* 1.8* 1.6* 1.5* <1.5*    CBG: Recent Labs  Lab 09/02/21 2359 09/03/21 0540 09/03/21 0845 09/03/21 1131 09/03/21 1657  GLUCAP 224* 209* 194* 202* 155*     Recent Results (from the past 240 hour(s))  Blood culture (routine x 2)     Status: None   Collection Time: 08/29/21 12:13 PM   Specimen: BLOOD  Result Value Ref Range Status   Specimen Description    Final    BLOOD RIGHT ANTECUBITAL Performed at [facility], [place] [address] [place], [place] [address]    Special Requests   Final    BOTTLES DRAWN AEROBIC AND ANAEROBIC Blood Culture results may not be optimal due to an excessive volume of blood received in culture bottles Performed at [place] [address], [place], [place] [address]    Culture   Final    NO GROWTH 5 DAYS Performed at [facility], [place] [address] [place], [place] [address]    Report Status 09/03/2021 FINAL  Final  Aerobic/Anaerobic Culture w Gram Stain (surgical/deep wound)     Status: None   Collection Time: 08/29/21 12:35 PM   Specimen: Wound  Result Value Ref Range Status   Specimen Description   Final    WOUND LEFT FOOT Performed at [place] [address], [place], [place] [address]    Special Requests   Final    NONE Performed at [facility], [place] [address] [place], [place] [address]    Gram Stain   Final    NO SQUAMOUS EPITHELIAL CELLS SEEN NO WBC SEEN MODERATE GRAM NEGATIVE RODS MODERATE GRAM POSITIVE COCCI    Culture   Final    ABUNDANT PROTEUS MIRABILIS MODERATE ENTEROCOCCUS FAECALIS NO ANAEROBES ISOLATED Performed at [facility], [address], [place],  [address]    Report Status 09/03/2021 FINAL  Final   Organism ID, Bacteria PROTEUS MIRABILIS  Final   Organism ID, Bacteria ENTEROCOCCUS FAECALIS  Final      Susceptibility   Enterococcus faecalis - MIC*    AMPICILLIN <=2 SENSITIVE Sensitive     VANCOMYCIN 1 SENSITIVE Sensitive     GENTAMICIN SYNERGY SENSITIVE Sensitive     * MODERATE ENTEROCOCCUS FAECALIS   Proteus mirabilis - MIC*    AMPICILLIN <=2 SENSITIVE Sensitive     CEFAZOLIN <=4 SENSITIVE Sensitive     CEFEPIME <=0.12 SENSITIVE Sensitive     CEFTAZIDIME <=1 SENSITIVE Sensitive     CEFTRIAXONE <=0.25 SENSITIVE Sensitive     CIPROFLOXACIN <=0.25 SENSITIVE Sensitive      GENTAMICIN <=1 SENSITIVE Sensitive     IMIPENEM 2 SENSITIVE Sensitive     TRIMETH/SULFA <=20 SENSITIVE Sensitive     AMPICILLIN/SULBACTAM <=2 SENSITIVE Sensitive     PIP/TAZO <=4 SENSITIVE Sensitive     * ABUNDANT PROTEUS MIRABILIS  Blood culture (routine x 2)     Status: None (Preliminary result)   Collection Time: 08/29/21  6:10 PM   Specimen: BLOOD  Result Value Ref Range Status   Specimen  Description   Final    BLOOD BLOOD RIGHT HAND Performed at [facility] [address], [place], [place] [address]    Special Requests   Final    BOTTLES DRAWN AEROBIC ONLY Blood Culture results may not be optimal due to an inadequate volume of blood received in culture bottles Performed at [place] [address], [place], [place] [address]    Culture   Final    NO GROWTH 4 DAYS Performed at [facility], [place] [address], [place], [place] [address]    Report Status PENDING  Incomplete  Aerobic/Anaerobic Culture w Gram Stain (surgical/deep wound)     Status: None (Preliminary result)   Collection Time: 08/30/21  9:53 AM   Specimen: Wound  Result Value Ref Range Status   Specimen Description   Final    WOUND LEFT FOOT WOUND Performed at [facility], [place] [address] [place], [place] [address]    Special Requests   Final    NONE Performed at [facility], [place] [address] [place], [place] [address]    Gram Stain   Final    RARE WBC PRESENT, PREDOMINANTLY PMN MODERATE GRAM POSITIVE COCCI IN PAIRS MODERATE GRAM VARIABLE ROD Performed at [facility], [place] [address], [place], [place] [address]    Culture   Final    MODERATE PROTEUS MIRABILIS FEW ENTEROCOCCUS FAECALIS NO ANAEROBES ISOLATED; CULTURE IN PROGRESS FOR 5 DAYS    Report Status PENDING  Incomplete   Organism ID, Bacteria PROTEUS MIRABILIS  Final   Organism ID, Bacteria ENTEROCOCCUS FAECALIS  Final      Susceptibility   Enterococcus faecalis -  MIC*    AMPICILLIN <=2 SENSITIVE Sensitive     VANCOMYCIN 1 SENSITIVE Sensitive     GENTAMICIN SYNERGY SENSITIVE Sensitive     * FEW ENTEROCOCCUS FAECALIS   Proteus mirabilis - MIC*    AMPICILLIN <=2 SENSITIVE Sensitive     CEFAZOLIN <=4 SENSITIVE Sensitive     CEFEPIME <=0.12 SENSITIVE Sensitive     CEFTAZIDIME <=1 SENSITIVE Sensitive     CEFTRIAXONE <=0.25 SENSITIVE Sensitive     CIPROFLOXACIN <=0.25 SENSITIVE Sensitive     GENTAMICIN <=1 SENSITIVE Sensitive     IMIPENEM 2 SENSITIVE Sensitive     TRIMETH/SULFA <=20 SENSITIVE Sensitive     AMPICILLIN/SULBACTAM <=2 SENSITIVE Sensitive     PIP/TAZO <=4 SENSITIVE Sensitive     * MODERATE PROTEUS MIRABILIS  Surgical PCR screen     Status: None   Collection Time: 09/02/21  5:30 AM   Specimen: Nasal Mucosa; Nasal Swab  Result Value Ref Range Status   MRSA, PCR NEGATIVE NEGATIVE Final   Staphylococcus aureus NEGATIVE NEGATIVE Final    Comment: (NOTE) The Xpert SA Assay (FDA approved for NASAL specimens in patients 91 years of age and older), is one component of a comprehensive surveillance program. It is not intended to diagnose infection nor to guide or monitor treatment. Performed at [facility], [place] [address], [place], [place] [address]   Aerobic/Anaerobic Culture w Gram Stain (surgical/deep wound)     Status: None (Preliminary result)   Collection Time: 09/02/21  3:07 PM   Specimen: Soft Tissue, Other  Result Value Ref Range Status   Specimen Description WOUND  Final   Special Requests LEFT LEG PUS  Final   Gram Stain   Final    MODERATE WBC PRESENT,BOTH PMN AND MONONUCLEAR FEW GRAM NEGATIVE RODS  FEW GRAM POSITIVE COCCI IN PAIRS FEW GRAM POSITIVE COCCI IN CLUSTERS    Culture   Final    ABUNDANT PROTEUS MIRABILIS SUSCEPTIBILITIES TO FOLLOW Performed at [facility], [place] [address] [place], [place] [address]    Report Status PENDING  Incomplete         Radiology Studies: US RENAL  Result Date:  09/24/2021 CLINICAL DATA:  Acute kidney injury EXAM: RENAL / URINARY TRACT ULTRASOUND COMPLETE COMPARISON:  CT 05/14/2020, ultrasound 01/08/2020 FINDINGS: Right Kidney: Renal measurements: 10.6 x 4.9 x 4.7 cm = volume: 126.7 mL. Echogenicity within normal limits. No mass or hydronephrosis visualized. Left Kidney: Renal measurements: 10.4 x 4.5 x 4.3 cm = volume: 105.9 mL. Echogenicity within normal limits. No mass or hydronephrosis visualized. Bladder: Appears normal for degree of bladder distention. Other: None. IMPRESSION: Negative renal ultrasound Electronically Signed   By: Donavan Foil M.D.   On: 09/24/2021 21:32        Scheduled Meds:  amLODipine  10 mg Oral Daily   vitamin C  1,000 mg Oral Daily   aspirin EC  81 mg Oral Daily   carvedilol  25 mg Oral BID WC   docusate sodium  100 mg Oral Daily   feeding supplement  237 mL Oral BID BM   ferrous sulfate  325 mg Oral Q breakfast   gabapentin  300 mg Oral QHS   insulin aspart  0-15 Units Subcutaneous Q4H   insulin glargine-yfgn  10 Units Subcutaneous Daily   multivitamin with minerals  1 tablet Oral Daily   mupirocin ointment  1 application  Nasal BID   nutrition supplement (JUVEN)  1 packet Oral BID BM   pantoprazole  40 mg Oral Daily   zinc sulfate  220 mg Oral Daily   Continuous Infusions:   ceFAZolin (ANCEF) IV 2 g (09/03/21 1006)   DAPTOmycin (CUBICIN) 550 mg in sodium chloride 0.9 % IVPB     magnesium sulfate bolus IVPB     sodium bicarbonate 75 mEq in sodium chloride 0.45 % 1,075 mL infusion 100 mL/hr at 09/03/21 0342     LOS: 5 days    Time spent: 35 minutes    Irine Seal, MD [facility]   To contact the attending provider between 7A-7P or the covering provider during after hours 7P-7A, please log into the web site [email or web address] and access using universal [place] password for that web site. If you do not have the password, please call the hospital operator.  09/03/2021, 5:21 PM

## 2021-09-03 NOTE — Anesthesia Postprocedure Evaluation (Signed)
Anesthesia Post Note  Patient: [name]  Procedure(s) Performed: LEFT BELOW KNEE AMPUTATION (Left: Knee)     Patient location during evaluation: PACU Anesthesia Type: General Level of consciousness: awake and alert Pain management: pain level controlled Vital Signs Assessment: post-procedure vital signs reviewed and stable Respiratory status: spontaneous breathing, nonlabored ventilation and respiratory function stable Cardiovascular status: blood pressure returned to baseline and stable Postop Assessment: no apparent nausea or vomiting Anesthetic complications: no   No notable events documented.  Last Vitals:  Vitals:   09/02/21 2038 09/02/21 2355  BP: (!) 146/77 (!) 159/88  Pulse: 82 79  Resp: 18 20  Temp: 36.5 C 36.4 C  SpO2: 100% 100%    Last Pain:  Vitals:   09/02/21 2355  TempSrc: Oral  PainSc:                  Lynda Rainwater

## 2021-09-03 NOTE — Progress Notes (Signed)
Pharmacy Antibiotic Note  [name] is a 51 y.o. 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  Pharmacy has been consulted for Daptomycin + Cefazolin dosing.  L-foot wound cx with pan-sensitive Proteus + E faecalis from 6/4. Cultures from residual infection seem to be showing the same organisms. PCN allergy noted of hives - patient interviewed last year and was unwilling to try penicillins again. Will attempt to revisit this admission. Worsening AKI so avoiding Vanc - will do Daptomycin for now. Noted plans for back to OR with Dr. Sharol Given tomorrow.   Plan: - Start Cefazolin 2g IV every 12 hours - Start Daptomycin 550 mg IV every 48 hours - Will follow-up on culture results and additional surgical plans w/ ortho  Height: '5\' 4"'$  (162.6 cm) Weight: 87.5 kg (192 lb 14.4 oz) IBW/kg (Calculated) : 59.2  Temp (24hrs), Avg:97.7 F (36.5 C), Min:97.5 F (36.4 C), Max:98.1 F (36.7 C)  Recent Labs  Lab 08/29/21 1213 08/29/21 1810 08/30/21 0520 08/31/21 0449 09/01/21 0138 09/02/21 0212 09/03/21 0149  WBC  --   --  39.8* 50.9* 50.5* 45.5* 46.8*  CREATININE 5.21*  --  4.23* 3.84* 4.07* 3.78* 3.69*  LATICACIDVEN 1.8 1.4  --   --   --   --   --   VANCORANDOM  --   --   --  12 20  --   --     Estimated Creatinine Clearance: 23.6 mL/min (A) (by C-G formula based on SCr of 3.69 mg/dL (H)).    Allergies  Allergen Reactions   Bee Venom Swelling    SWELLING REACTION UNSPECIFIED    Penicillins Hives and Other (See Comments)    Full body hives as a child with no shortness of breath or swelling  **Can tolerate cephalosporins**   Clonidine Derivatives Other (See Comments)    Pt states "It is making me dizzy"    Antimicrobials this admission: Vancomycin 6/3 >> 6/7 Cefepime 6/3 >> 6/7 Daptomycin 6/8 >> Cefazolin 6/8 >>  Dose adjustments this admission:   Microbiology results: 6/3 BCx >> ngtd 6/3 + 6/4 L-foot wound cx >> pan-sensitive  proteus + E faecalis 6/7 MRSA PCR >> neg 6/7 L-leg cx (pus intra-op) >> GNR, GPC in pairs/clusters on GS >> GNR on culture  Thank you for allowing pharmacy to be a part of this patient's care.  Alycia Rossetti, PharmD, BCPS Infectious Diseases Clinical Pharmacist 09/03/2021 1:31 PM   **Pharmacist phone directory can now be found on [email or web address] ([email or web address]).  Listed under [place].

## 2021-09-03 NOTE — Evaluation (Addendum)
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 PMH: allergies, iron deficiency anemia, 3B stage CKD, CAD, constipation, type II DM, glaucoma, hyperlipidemia, hypertension, diabetic peripheral neuropathy, diabetic retinopathy, tobacco use, multiple lower extremities I&D and amputations   Clinical Impression  Pt presents with condition above and deficits mentioned below, see PT Problem List. PTA, he was mod I using a SPC intermittently for mobility, living with his girlfriend, who is w/c bound, in a 1-level house with a ramped entrance. Currently, pt is limited by deficits in cognition, balance, activity tolerance, strength, and by feelings of lightheadedness when sitting up. His BP was 136/89 supine and 151/84 sitting though. Pt was unable to successfully scoot laterally on EOB or stand with up to maxAx2, instead continually scooting himself anteriorly. Thus, deferred OOB mobility today. Pt required minA to roll and transition supine > sit, but mod-maxA to return to supine. As AIR signed off due to insurance being unlikely to approve pt for AIR, recommending short-term rehab at a SNF. Will continue to follow acutely.       Recommendations for follow up therapy are one component of a multi-disciplinary discharge planning process, led by the attending physician.  Recommendations may be updated based on patient status, additional functional criteria and insurance authorization.  Follow Up Recommendations Skilled nursing-short term rehab (<3 hours/day) (insurance will not approve AIR)    Assistance Recommended at Discharge Frequent or constant Supervision/Assistance  Patient can return home with the following  Two people to help with walking  and/or transfers;A lot of help with bathing/dressing/bathroom;Assistance with cooking/housework;Direct supervision/assist for medications management;Direct supervision/assist for financial management;Assist for transportation;Help with stairs or ramp for entrance    Equipment Recommendations Other (comment) (tub bench)  Recommendations for Other Services       Functional Status Assessment Patient has had a recent decline in their functional status and demonstrates the ability to make significant improvements in function in a reasonable and predictable amount of time.     Precautions / Restrictions Precautions Precautions: Fall;Other (comment) Precaution Comments: s/p L BKA 09/02/21, watch BP Restrictions Weight Bearing Restrictions: Yes LLE Weight Bearing: Non weight bearing      Mobility  Bed Mobility Overal bed mobility: Needs Assistance Bed Mobility: Supine to Sit, Sit to Supine, Rolling Rolling: Min assist   Supine to sit: Min assist, HOB elevated, +2 for safety/equipment Sit to supine: Max assist, Mod assist, +2 for safety/equipment   General bed mobility comments: Pt transitioning supine <> sit EOB 2x, needing cues to slide legs to EOB and grab bed rails to pull trunk up to sit with HOB elevated. Pt rolling each direction with minA and cues for hand placement on rails.    Transfers Overall transfer level: Needs assistance Equipment used: 2 person hand held assist Transfers: Sit to/from Stand Sit to Stand: Max assist, +2 physical assistance, +2 safety/equipment           General transfer comment: Attempted to cue pt to scoot laterally to R, but pt forgetting intended direction at times. Pt repeatedly scooting anteriorly to the point the L residual limb was almost touching the ground. Cued pt to stand to try to reposition him back and up in bed, but unable with maxAx2 and  R knee block, thus returned pt to supine in bed for safety    Ambulation/Gait                General Gait Details: deferred  Stairs            Wheelchair Mobility    Modified Rankin (Stroke Patients Only)       Balance Overall balance assessment: Needs assistance Sitting-balance support: Bilateral upper extremity supported, Single extremity supported, Feet supported Sitting balance-Leahy Scale: Poor Sitting balance - Comments: Pt with jerking/twitching movements when sitting EOB and often closing eyes, displaying trunk sway and need for up to modA for balance sitting EOB. Moments of min guard       Standing balance comment: unable                             Pertinent Vitals/Pain Pain Assessment Pain Assessment: Faces Faces Pain Scale: Hurts little more Pain Location: L residual limb Pain Descriptors / Indicators: Discomfort, Grimacing, Operative site guarding Pain Intervention(s): Limited activity within patient's tolerance, Monitored during session, Repositioned    Home Living Family/patient expects to be discharged to:: Private residence Living Arrangements: Spouse/significant other Available Help at Discharge: Family Type of Home: House Home Access: Ramped entrance       Home Layout: One level Home Equipment: [place] - single Barista (2 wheels);Wheelchair - manual;BSC/3in1;Shower seat Additional Comments: Lives with girlfriend who has CP and stays in her manual w/c at all times    Prior Function Prior Level of Function : Independent/Modified Independent;Driving             Mobility Comments: walked with SPC intermittently ADLs Comments: Pt would do the cooking, driving, and cleaning     Hand Dominance        Extremity/Trunk Assessment   Upper Extremity Assessment Upper Extremity Assessment: Defer to OT evaluation    Lower Extremity Assessment Lower Extremity Assessment: LLE deficits/detail;RLE deficits/detail RLE Deficits / Details: prior TMA of R foot; reports sensation deficits; gross functional weakness  noted RLE Sensation: decreased light touch LLE Deficits / Details: New L BKA; reports sensation deficits; gross functional weakness noted LLE Sensation: decreased light touch    Cervical / Trunk Assessment Cervical / Trunk Assessment: Normal  Communication   Communication: No difficulties  Cognition Arousal/Alertness: Lethargic Behavior During Therapy: Flat affect Overall Cognitive Status: Impaired/Different from baseline Area of Impairment: Attention, Memory, Following commands, Safety/judgement, Awareness, Problem solving                   Current Attention Level: Sustained Memory: Decreased short-term memory Following Commands: Follows one step commands consistently, Follows one step commands with increased time Safety/Judgement: Decreased awareness of deficits, Decreased awareness of safety Awareness: Emergent Problem Solving: Slow processing, Decreased initiation, Difficulty sequencing, Requires verbal cues, Requires tactile cues General Comments: A&O to month, situation, location, and self but reported not knowing the specific date. Pt lethargic and began having twitching/jerking movements when sitting EOB but remained awake and talkative with therapists with stimulation. Pt reporting lightheadedness sitting up. Poor comprehension of questions about home set-up with little insight into plan or alternative options for care for his SO. Pt forgetful of cues to roll or which direction to scoot, not comprehending cues at times either, needing multi-modal repeated simple cues and extra time for processing.        General Comments General comments (skin integrity, edema, etc.): BP 136/89 supine, 151/84 sitting  Exercises     Assessment/Plan    PT Assessment Patient needs continued PT services  PT Problem List Decreased strength;Decreased activity tolerance;Decreased balance;Decreased mobility;Decreased cognition;Decreased safety awareness;Impaired sensation;Pain;Decreased  skin integrity       PT Treatment Interventions DME instruction;Gait training;Functional mobility training;Therapeutic activities;Therapeutic exercise;Balance training;Cognitive remediation;Neuromuscular re-education;Patient/family education;Wheelchair mobility training    PT Goals (Current goals can be found in the Care Plan section)  Acute Rehab PT Goals Patient Stated Goal: to get some rest PT Goal Formulation: With patient Time For Goal Achievement: 09/17/21 Potential to Achieve Goals: Good    Frequency Min 2X/week     Co-evaluation PT/OT/SLP Co-Evaluation/Treatment: Yes Reason for Co-Treatment: Necessary to address cognition/behavior during functional activity;For patient/therapist safety;To address functional/ADL transfers PT goals addressed during session: Mobility/safety with mobility;Balance         AM-PAC PT "6 Clicks" Mobility  Outcome Measure Help needed turning from your back to your side while in a flat bed without using bedrails?: A Little Help needed moving from lying on your back to sitting on the side of a flat bed without using bedrails?: A Little Help needed moving to and from a bed to a chair (including a wheelchair)?: Total Help needed standing up from a chair using your arms (e.g., wheelchair or bedside chair)?: Total Help needed to walk in hospital room?: Total Help needed climbing 3-5 steps with a railing? : Total 6 Click Score: 10    End of Session Equipment Utilized During Treatment: Gait belt Activity Tolerance: Patient limited by lethargy;Other (comment) (limited by lightheadedness) Patient left: in bed;with call bell/phone within reach;with bed alarm set;Other (comment) (in chair position) Nurse Communication: Mobility status;Other (comment) (lightheadedness of pt) PT Visit Diagnosis: Unsteadiness on feet (R26.81);Muscle weakness (generalized) (M62.81);Difficulty in walking, not elsewhere classified (R26.2);Pain Pain - Right/Left: Left Pain -  part of body: Leg    Time: 0821-0849 PT Time Calculation (min) (ACUTE ONLY): 28 min   Charges:   PT Evaluation $PT Eval Moderate Complexity: 1 Mod          Moishe Spice, PT, DPT Acute Rehabilitation Services  Office: [phone]   Orvan Falconer 09/03/2021, 9:11 AM

## 2021-09-03 NOTE — Evaluation (Signed)
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 PMH: allergies, iron deficiency anemia, 3B stage CKD, CAD, constipation, type II DM, glaucoma, hyperlipidemia, hypertension, diabetic peripheral neuropathy, diabetic retinopathy, tobacco use, multiple lower extremities I&D and amputations   Clinical Impression   Pt at this time noted to become confused when asking some questions about PLOF and ambulation. Pt also in session reported they were feeling dizzy but then also "they can not feel anything" as they made gestures to BLE. Pt's BP was 136/89 supine and 151/84 sitting though. Attempted to completed sit to stand transfer with FW with max x2 and then followed by lateral scooting at EOB but was unable to follow cues on direction. Pt currently with functional limitations due to the deficits listed below (see OT Problem List).  Pt will benefit from skilled OT to increase their safety and independence with ADL and functional mobility for ADL to facilitate discharge to venue listed below. Per chart they will not qualify for AIR so next level of care recommendation is SNF.        Recommendations for follow up therapy are one component of a multi-disciplinary discharge planning process, led by the attending physician.  Recommendations may be updated based on patient status, additional functional criteria and insurance authorization.   Follow Up Recommendations  Skilled nursing-short term rehab (<3 hours/day) (does not qualify for CIR per notes)    Assistance Recommended at Discharge Frequent or constant Supervision/Assistance  Patient can return home with the following A lot of help with walking and/or transfers;A lot of help with  bathing/dressing/bathroom;Assistance with cooking/housework;Assist for transportation;Direct supervision/assist for medications management;Direct supervision/assist for financial management    Functional Status Assessment  Patient has had a recent decline in their functional status and demonstrates the ability to make significant improvements in function in a reasonable and predictable amount of time.  Equipment Recommendations   (TBD)    Recommendations for Other Services       Precautions / Restrictions Precautions Precautions: Fall;Other (comment) Precaution Comments: s/p L BKA 09/02/21, watch BP Restrictions Weight Bearing Restrictions: Yes LLE Weight Bearing: Non weight bearing      Mobility Bed Mobility Overal bed mobility: Needs Assistance Bed Mobility: Supine to Sit, Sit to Supine, Rolling Rolling: Min assist   Supine to sit: Min assist, HOB elevated, +2 for safety/equipment Sit to supine: Max assist, Mod assist, +2 for safety/equipment   General bed mobility comments: Pt transitioning supine <> sit EOB 2x, needing cues to slide legs to EOB and grab bed rails to pull trunk up to sit with HOB elevated. Pt rolling each direction with minA and cues for hand placement on rails.    Transfers Overall transfer level: Needs assistance Equipment used: 2 person hand held assist Transfers: Sit to/from Stand Sit to Stand: Max assist, +2 physical assistance, +2 safety/equipment           General transfer comment: Attempted to cue pt to scoot laterally to R, but pt forgetting intended direction at times. Pt repeatedly scooting anteriorly to the point the L residual limb was almost touching the ground. Cued pt to stand to try to reposition him back and up in bed, but unable with maxAx2 and R knee block, thus  returned pt to supine in bed for safety      Balance Overall balance assessment: Needs assistance Sitting-balance support: Bilateral upper extremity supported, Single  extremity supported, Feet supported Sitting balance-Leahy Scale: Poor Sitting balance - Comments: Pt with jerking/twitching movements when sitting EOB and often closing eyes, displaying trunk sway and need for up to modA for balance sitting EOB. Moments of min guard       Standing balance comment: unable                           ADL either performed or assessed with clinical judgement   ADL Overall ADL's : Needs assistance/impaired Eating/Feeding: Set up;Bed level   Grooming: Moderate assistance;Cueing for safety;Cueing for sequencing;Sitting   Upper Body Bathing: Cueing for safety;Cueing for sequencing;Bed level;Moderate assistance   Lower Body Bathing: Maximal assistance;Bed level   Upper Body Dressing : Moderate assistance;Cueing for safety;Cueing for sequencing;Bed level   Lower Body Dressing: Maximal assistance                 General ADL Comments: deffered further ambulation at this time due to cognition/following comands at this time     Vision         Perception     Praxis      Pertinent Vitals/Pain Pain Assessment Pain Assessment: Faces Faces Pain Scale: Hurts little more Pain Location: L residual limb Pain Descriptors / Indicators: Discomfort, Grimacing, Operative site guarding Pain Intervention(s): Limited activity within patient's tolerance, Monitored during session     Hand Dominance Right   Extremity/Trunk Assessment Upper Extremity Assessment Upper Extremity Assessment: Difficult to assess due to impaired cognition   Lower Extremity Assessment Lower Extremity Assessment: Defer to PT evaluation RLE Deficits / Details: prior TMA of R foot; reports sensation deficits; gross functional weakness noted RLE Sensation: decreased light touch LLE Deficits / Details: New L BKA; reports sensation deficits; gross functional weakness noted LLE Sensation: decreased light touch   Cervical / Trunk Assessment Cervical / Trunk Assessment:  Normal   Communication Communication Communication: No difficulties   Cognition Arousal/Alertness: Lethargic Behavior During Therapy: Flat affect Overall Cognitive Status: Impaired/Different from baseline Area of Impairment: Attention, Memory, Following commands, Safety/judgement, Awareness, Problem solving                   Current Attention Level: Sustained Memory: Decreased short-term memory Following Commands: Follows one step commands consistently, Follows one step commands with increased time Safety/Judgement: Decreased awareness of deficits, Decreased awareness of safety Awareness: Emergent Problem Solving: Slow processing, Decreased initiation, Difficulty sequencing, Requires verbal cues, Requires tactile cues General Comments: A&O to month, situation, location, and self but reported not knowing the specific date. Pt lethargic and began having twitching/jerking movements when sitting EOB but remained awake and talkative with therapists with stimulation. Pt reporting lightheadedness sitting up. Poor comprehension of questions about home set-up with little insight into plan or alternative options for care for his SO. Pt forgetful of cues to roll or which direction to scoot, not comprehending cues at times either, needing multi-modal repeated simple cues and extra time for processing.     General Comments  BP 136/89 supine, 151/84 sitting    Exercises     Shoulder Instructions      Home Living Family/patient expects to be discharged to:: Private residence Living Arrangements: Spouse/significant other Available Help at Discharge: Family Type of Home: House Home Access: Ramped entrance     Home Layout: One  level     Bathroom Shower/Tub: Teacher, early years/pre: Standard Bathroom Accessibility: Yes   Home Equipment: Cane - single Barista (2 wheels);Wheelchair - manual;BSC/3in1;Shower seat   Additional Comments: Lives with girlfriend who has  CP and stays in her manual w/c at all times      Prior Functioning/Environment Prior Level of Function : Independent/Modified Independent;Driving             Mobility Comments: walked with SPC intermittently ADLs Comments: Pt would do the cooking, driving, and cleaning        OT Problem List: Decreased strength;Decreased activity tolerance;Impaired balance (sitting and/or standing);Decreased safety awareness;Decreased knowledge of use of DME or AE;Decreased cognition;Pain;Impaired sensation      OT Treatment/Interventions: Self-care/ADL training;Therapeutic exercise;DME and/or AE instruction;Therapeutic activities;Patient/family education;Balance training    OT Goals(Current goals can be found in the care plan section) Acute Rehab OT Goals Patient Stated Goal: unable to report OT Goal Formulation: Patient unable to participate in goal setting Time For Goal Achievement: 09/17/21 Potential to Achieve Goals: Poor  OT Frequency: Min 2X/week    Co-evaluation PT/OT/SLP Co-Evaluation/Treatment: Yes Reason for Co-Treatment: Complexity of the patient's impairments (multi-system involvement);Necessary to address cognition/behavior during functional activity PT goals addressed during session: Mobility/safety with mobility;Balance OT goals addressed during session: ADL's and self-care      AM-PAC OT "6 Clicks" Daily Activity     Outcome Measure Help from another person eating meals?: A Little Help from another person taking care of personal grooming?: A Lot Help from another person toileting, which includes using toliet, bedpan, or urinal?: A Lot Help from another person bathing (including washing, rinsing, drying)?: A Lot Help from another person to put on and taking off regular upper body clothing?: A Lot Help from another person to put on and taking off regular lower body clothing?: A Lot 6 Click Score: 13   End of Session Equipment Utilized During Treatment: Gait belt;Rolling  walker (2 wheels) Nurse Communication: Mobility status  Activity Tolerance: Patient limited by lethargy Patient left: in bed;with call bell/phone within reach;with bed alarm set  OT Visit Diagnosis: Unsteadiness on feet (R26.81);Other abnormalities of gait and mobility (R26.89);Repeated falls (R29.6);Muscle weakness (generalized) (M62.81);Pain Pain - Right/Left: Left Pain - part of body: Leg                Time: 9024-0973 OT Time Calculation (min): 23 min Charges:  OT General Charges $OT Visit: 1 Visit OT Evaluation $OT Eval Moderate Complexity: 1 Mod  Joeseph Amor OTR/L  Acute Rehab Services  [phone] office number [phone] pager number   Joeseph Amor 09/03/2021, 10:48 AM

## 2021-09-03 NOTE — Progress Notes (Signed)
Inpatient Diabetes Program Recommendations  AACE/ADA: New Consensus Statement on Inpatient Glycemic Control (2015)  Target Ranges:  Prepandial:   less than 140 mg/dL      Peak postprandial:   less than 180 mg/dL (1-2 hours)      Critically ill patients:  140 - 180 mg/dL   Lab Results  Component Value Date   GLUCAP 209 (H) 09/03/2021   HGBA1C 7.5 (H) 08/30/2021    Review of Glycemic Control  Latest Reference Range & Units 09/02/21 08:13 09/02/21 11:43 09/02/21 12:30 09/02/21 13:39 09/02/21 14:41 09/02/21 15:33 09/02/21 16:48 09/02/21 20:43 09/02/21 23:59 09/03/21 05:40  Glucose-Capillary 70 - 99 mg/dL 96 75 70 111 (H) 88 106 (H) 95 206 (H) 224 (H) 209 (H)   Diabetes history: DM 2 Outpatient Diabetes medications: Lantus 15 BID Current orders for Inpatient glycemic control:  Novolog 0-15 units q4 hours  Ensure enlive bid between meals  Inpatient Diabetes Program Recommendations:    Glucose trends back up after hypoglycemia yesterday  -   Add Semglee 10 units Daily -  Change Novolog Correction to tid + hs   Thanks,  Tama Headings RN, MSN, BC-ADM Inpatient Diabetes Coordinator Team Pager [phone] (8a-5p)
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
## 2021-09-04 ENCOUNTER — Inpatient Hospital Stay (HOSPITAL_COMMUNITY): Payer: Medicare Other | Admitting: Anesthesiology

## 2021-09-04 ENCOUNTER — Encounter (HOSPITAL_COMMUNITY): Payer: Self-pay | Admitting: Internal Medicine

## 2021-09-04 ENCOUNTER — Inpatient Hospital Stay (HOSPITAL_COMMUNITY): Payer: Medicare Other

## 2021-09-04 ENCOUNTER — Encounter (HOSPITAL_COMMUNITY)
Admission: EM | Disposition: A | Discharge status: Skilled Nursing Facility | Payer: Self-pay | Source: Home / Self Care | Attending: Internal Medicine

## 2021-09-04 ENCOUNTER — Other Ambulatory Visit: Payer: Self-pay

## 2021-09-04 DIAGNOSIS — M869 Osteomyelitis, unspecified: Secondary | ICD-10-CM | POA: Diagnosis not present

## 2021-09-04 DIAGNOSIS — E1122 Type 2 diabetes mellitus with diabetic chronic kidney disease: Secondary | ICD-10-CM | POA: Diagnosis not present

## 2021-09-04 DIAGNOSIS — E11621 Type 2 diabetes mellitus with foot ulcer: Secondary | ICD-10-CM | POA: Diagnosis not present

## 2021-09-04 DIAGNOSIS — T8744 Infection of amputation stump, left lower extremity: Secondary | ICD-10-CM | POA: Diagnosis not present

## 2021-09-04 DIAGNOSIS — T8781 Dehiscence of amputation stump: Secondary | ICD-10-CM

## 2021-09-04 DIAGNOSIS — I2510 Atherosclerotic heart disease of native coronary artery without angina pectoris: Secondary | ICD-10-CM

## 2021-09-04 DIAGNOSIS — I129 Hypertensive chronic kidney disease with stage 1 through stage 4 chronic kidney disease, or unspecified chronic kidney disease: Secondary | ICD-10-CM

## 2021-09-04 DIAGNOSIS — N1832 Chronic kidney disease, stage 3b: Secondary | ICD-10-CM

## 2021-09-04 DIAGNOSIS — N184 Chronic kidney disease, stage 4 (severe): Secondary | ICD-10-CM | POA: Diagnosis not present

## 2021-09-04 DIAGNOSIS — N179 Acute kidney failure, unspecified: Secondary | ICD-10-CM | POA: Diagnosis not present

## 2021-09-04 HISTORY — PX: APPLICATION OF WOUND VAC: SHX5189

## 2021-09-04 HISTORY — PX: IR US GUIDE VASC ACCESS RIGHT: IMG2390

## 2021-09-04 HISTORY — PX: IR FLUORO GUIDE CV LINE RIGHT: IMG2283

## 2021-09-04 HISTORY — PX: STUMP REVISION: SHX6102

## 2021-09-04 LAB — HEPATIC FUNCTION PANEL
ALT: 19 U/L (ref 0–44)
AST: 23 U/L (ref 15–41)
Albumin: <1.5 g/dL — ABNORMAL LOW (ref 3.5–5.0)
Alkaline Phosphatase: 97 U/L (ref 38–126)
Bilirubin, Direct: 0.2 mg/dL (ref 0.0–0.2)
Indirect Bilirubin: 0.5 mg/dL (ref 0.3–0.9)
Total Bilirubin: 0.7 mg/dL (ref 0.3–1.2)
Total Protein: 5.8 g/dL — ABNORMAL LOW (ref 6.5–8.1)

## 2021-09-04 LAB — RENAL FUNCTION PANEL
Albumin: <1.5 g/dL — ABNORMAL LOW (ref 3.5–5.0)
Anion gap: 12 (ref 5–15)
BUN: 97 mg/dL — ABNORMAL HIGH (ref 6–20)
CO2: 22 mmol/L (ref 22–32)
Calcium: 7.9 mg/dL — ABNORMAL LOW (ref 8.9–10.3)
Chloride: 108 mmol/L (ref 98–111)
Creatinine, Ser: 3.36 mg/dL — ABNORMAL HIGH (ref 0.61–1.24)
GFR, Estimated: 21 mL/min — ABNORMAL LOW (ref >60)
Glucose, Bld: 112 mg/dL — ABNORMAL HIGH (ref 70–99)
Phosphorus: 3.8 mg/dL (ref 2.5–4.6)
Potassium: 4.1 mmol/L (ref 3.5–5.1)
Sodium: 142 mmol/L (ref 135–145)

## 2021-09-04 LAB — CULTURE, BLOOD (ROUTINE X 2): Culture: NO GROWTH

## 2021-09-04 LAB — GLUCOSE, CAPILLARY
Glucose-Capillary: 149 mg/dL — ABNORMAL HIGH (ref 70–99)
Glucose-Capillary: 117 mg/dL — ABNORMAL HIGH (ref 70–99)
Glucose-Capillary: 107 mg/dL — ABNORMAL HIGH (ref 70–99)
Glucose-Capillary: 144 mg/dL — ABNORMAL HIGH (ref 70–99)
Glucose-Capillary: 134 mg/dL — ABNORMAL HIGH (ref 70–99)
Glucose-Capillary: 143 mg/dL — ABNORMAL HIGH (ref 70–99)
Glucose-Capillary: 163 mg/dL — ABNORMAL HIGH (ref 70–99)

## 2021-09-04 LAB — CBC
HCT: 27.1 % — ABNORMAL LOW (ref 39.0–52.0)
Hemoglobin: 9 g/dL — ABNORMAL LOW (ref 13.0–17.0)
MCH: 26.8 pg (ref 26.0–34.0)
MCHC: 33.2 g/dL (ref 30.0–36.0)
MCV: 80.7 fL (ref 80.0–100.0)
Platelets: 458 10*3/uL — ABNORMAL HIGH (ref 150–400)
RBC: 3.36 MIL/uL — ABNORMAL LOW (ref 4.22–5.81)
RDW: 15.8 % — ABNORMAL HIGH (ref 11.5–15.5)
WBC: 39.1 10*3/uL — ABNORMAL HIGH (ref 4.0–10.5)
nRBC: 0.1 % (ref 0.0–0.2)

## 2021-09-04 SURGERY — REVISION, AMPUTATION SITE
Anesthesia: General | Site: Leg Lower | Laterality: Left

## 2021-09-04 MED ORDER — PROPOFOL 10 MG/ML IV BOLUS
INTRAVENOUS | Status: AC
  Filled 2021-09-04: qty 20

## 2021-09-04 MED ORDER — ASCORBIC ACID 500 MG PO TABS: 1000.0000 mg | ORAL_TABLET | Freq: Every day | ORAL | Status: DC

## 2021-09-04 MED ORDER — ACETAMINOPHEN 500 MG PO TABS: 1000.0000 mg | ORAL_TABLET | Freq: Once | ORAL | Status: AC

## 2021-09-04 MED ORDER — FENTANYL CITRATE (PF) 100 MCG/2ML IJ SOLN
INTRAMUSCULAR | Status: AC
  Administered 2021-09-04: 100 ug via INTRAVENOUS
  Filled 2021-09-04: qty 2

## 2021-09-04 MED ORDER — METOPROLOL TARTRATE 5 MG/5ML IV SOLN: 2.0000 mg | INTRAVENOUS | Status: DC | PRN

## 2021-09-04 MED ORDER — ONDANSETRON HCL 4 MG/2ML IJ SOLN
INTRAMUSCULAR | Status: AC
  Filled 2021-09-04: qty 2

## 2021-09-04 MED ORDER — PROPOFOL 10 MG/ML IV BOLUS
INTRAVENOUS | Status: DC | PRN
  Administered 2021-09-04: 160 mg via INTRAVENOUS

## 2021-09-04 MED ORDER — ALUM & MAG HYDROXIDE-SIMETH 200-200-20 MG/5ML PO SUSP: 15.0000 mL | ORAL | Status: DC | PRN

## 2021-09-04 MED ORDER — TRANEXAMIC ACID-NACL 1000-0.7 MG/100ML-% IV SOLN
1000.0000 mg | INTRAVENOUS | Status: AC
  Administered 2021-09-04: 1000 mg via INTRAVENOUS

## 2021-09-04 MED ORDER — OXYCODONE HCL 5 MG/5ML PO SOLN: 5.0000 mg | Freq: Once | ORAL | Status: DC | PRN

## 2021-09-04 MED ORDER — ONDANSETRON HCL 4 MG/2ML IJ SOLN
4.0000 mg | Freq: Four times a day (QID) | INTRAMUSCULAR | Status: DC | PRN
  Administered 2021-09-04 – 2021-09-11 (×2): 4 mg via INTRAVENOUS
  Filled 2021-09-04: qty 2

## 2021-09-04 MED ORDER — ONDANSETRON HCL 4 MG/2ML IJ SOLN
INTRAMUSCULAR | Status: DC | PRN
  Administered 2021-09-04: 4 mg via INTRAVENOUS

## 2021-09-04 MED ORDER — DEXAMETHASONE SODIUM PHOSPHATE 10 MG/ML IJ SOLN
INTRAMUSCULAR | Status: AC
  Filled 2021-09-04: qty 1

## 2021-09-04 MED ORDER — GUAIFENESIN-DM 100-10 MG/5ML PO SYRP: 15.0000 mL | ORAL_SOLUTION | ORAL | Status: DC | PRN

## 2021-09-04 MED ORDER — OXYCODONE HCL 5 MG PO TABS: 10.0000 mg | ORAL_TABLET | ORAL | Status: DC | PRN

## 2021-09-04 MED ORDER — SUCCINYLCHOLINE CHLORIDE 200 MG/10ML IV SOSY
PREFILLED_SYRINGE | INTRAVENOUS | Status: AC
  Filled 2021-09-04: qty 10

## 2021-09-04 MED ORDER — MIDAZOLAM HCL 2 MG/2ML IJ SOLN: 2.0000 mg | Freq: Once | INTRAMUSCULAR | Status: AC

## 2021-09-04 MED ORDER — BISACODYL 5 MG PO TBEC: 5.0000 mg | DELAYED_RELEASE_TABLET | Freq: Every day | ORAL | Status: DC | PRN

## 2021-09-04 MED ORDER — KETAMINE HCL 50 MG/5ML IJ SOSY
PREFILLED_SYRINGE | INTRAMUSCULAR | Status: AC
  Filled 2021-09-04: qty 5

## 2021-09-04 MED ORDER — JUVEN PO PACK: 1.0000 | PACK | Freq: Two times a day (BID) | ORAL | Status: DC

## 2021-09-04 MED ORDER — ATORVASTATIN CALCIUM 10 MG PO TABS
20.0000 mg | ORAL_TABLET | Freq: Every day | ORAL | Status: DC
  Administered 2021-09-05 – 2021-09-15 (×11): 20 mg via ORAL
  Filled 2021-09-04 (×11): qty 2

## 2021-09-04 MED ORDER — CEFAZOLIN SODIUM-DEXTROSE 2-4 GM/100ML-% IV SOLN: 2.0000 g | Freq: Three times a day (TID) | INTRAVENOUS | Status: DC

## 2021-09-04 MED ORDER — DOCUSATE SODIUM 100 MG PO CAPS: 100.0000 mg | ORAL_CAPSULE | Freq: Every day | ORAL | Status: DC

## 2021-09-04 MED ORDER — MIDAZOLAM HCL 2 MG/2ML IJ SOLN
INTRAMUSCULAR | Status: AC
Start: 2021-09-04 — End: ?
  Filled 2021-09-04: qty 2

## 2021-09-04 MED ORDER — PANTOPRAZOLE SODIUM 40 MG PO TBEC: 40.0000 mg | DELAYED_RELEASE_TABLET | Freq: Every day | ORAL | Status: DC

## 2021-09-04 MED ORDER — ACETAMINOPHEN 325 MG PO TABS: 325.0000 mg | ORAL_TABLET | Freq: Four times a day (QID) | ORAL | Status: DC | PRN

## 2021-09-04 MED ORDER — MAGNESIUM CITRATE PO SOLN: 1.0000 | Freq: Once | ORAL | Status: DC | PRN

## 2021-09-04 MED ORDER — HYDRALAZINE HCL 20 MG/ML IJ SOLN: 5.0000 mg | INTRAMUSCULAR | Status: DC | PRN

## 2021-09-04 MED ORDER — OXYCODONE HCL 5 MG PO TABS: 5.0000 mg | ORAL_TABLET | ORAL | Status: DC | PRN

## 2021-09-04 MED ORDER — POLYETHYLENE GLYCOL 3350 17 G PO PACK: 17.0000 g | PACK | Freq: Every day | ORAL | Status: DC | PRN

## 2021-09-04 MED ORDER — LABETALOL HCL 5 MG/ML IV SOLN: 10.0000 mg | INTRAVENOUS | Status: DC | PRN

## 2021-09-04 MED ORDER — MIDAZOLAM HCL 2 MG/2ML IJ SOLN
INTRAMUSCULAR | Status: AC
  Administered 2021-09-04: 2 mg via INTRAVENOUS
  Filled 2021-09-04: qty 2

## 2021-09-04 MED ORDER — SUCCINYLCHOLINE CHLORIDE 200 MG/10ML IV SOSY
PREFILLED_SYRINGE | INTRAVENOUS | Status: DC | PRN
  Administered 2021-09-04: 100 mg via INTRAVENOUS

## 2021-09-04 MED ORDER — BUPIVACAINE LIPOSOME 1.3 % IJ SUSP
INTRAMUSCULAR | Status: DC | PRN
  Administered 2021-09-04: 10 mL

## 2021-09-04 MED ORDER — HYDROMORPHONE HCL 1 MG/ML IJ SOLN: 0.5000 mg | INTRAMUSCULAR | Status: DC | PRN

## 2021-09-04 MED ORDER — TRANEXAMIC ACID-NACL 1000-0.7 MG/100ML-% IV SOLN
INTRAVENOUS | Status: AC
  Filled 2021-09-04: qty 100

## 2021-09-04 MED ORDER — POTASSIUM CHLORIDE CRYS ER 20 MEQ PO TBCR: 20.0000 meq | EXTENDED_RELEASE_TABLET | Freq: Every day | ORAL | Status: DC | PRN

## 2021-09-04 MED ORDER — INSULIN ASPART 100 UNIT/ML IJ SOLN
0.0000 [IU] | INTRAMUSCULAR | Status: DC | PRN
  Administered 2021-09-04: 2 [IU] via SUBCUTANEOUS

## 2021-09-04 MED ORDER — CEFAZOLIN SODIUM-DEXTROSE 2-4 GM/100ML-% IV SOLN
2.0000 g | INTRAVENOUS | Status: AC
  Administered 2021-09-04: 2 g via INTRAVENOUS

## 2021-09-04 MED ORDER — HEPARIN SOD (PORK) LOCK FLUSH 100 UNIT/ML IV SOLN
INTRAVENOUS | Status: AC
  Filled 2021-09-04: qty 5

## 2021-09-04 MED ORDER — OXYCODONE HCL 5 MG PO TABS: 5.0000 mg | ORAL_TABLET | Freq: Once | ORAL | Status: DC | PRN

## 2021-09-04 MED ORDER — ONDANSETRON HCL 4 MG/2ML IJ SOLN: 4.0000 mg | Freq: Once | INTRAMUSCULAR | Status: DC | PRN

## 2021-09-04 MED ORDER — DEXAMETHASONE SODIUM PHOSPHATE 10 MG/ML IJ SOLN
INTRAMUSCULAR | Status: DC | PRN
  Administered 2021-09-04: 5 mg via INTRAVENOUS

## 2021-09-04 MED ORDER — CHLORHEXIDINE GLUCONATE 0.12 % MT SOLN
OROMUCOSAL | Status: AC
  Administered 2021-09-04: 15 mL via OROMUCOSAL
  Filled 2021-09-04: qty 15

## 2021-09-04 MED ORDER — SODIUM CHLORIDE 0.9 % IV SOLN: INTRAVENOUS | Status: DC

## 2021-09-04 MED ORDER — FENTANYL CITRATE (PF) 100 MCG/2ML IJ SOLN: 100.0000 ug | Freq: Once | INTRAMUSCULAR | Status: AC

## 2021-09-04 MED ORDER — ORAL CARE MOUTH RINSE: 15.0000 mL | Freq: Once | OROMUCOSAL | Status: AC

## 2021-09-04 MED ORDER — MAGNESIUM SULFATE 2 GM/50ML IV SOLN: 2.0000 g | Freq: Every day | INTRAVENOUS | Status: DC | PRN

## 2021-09-04 MED ORDER — BUPIVACAINE HCL (PF) 0.5 % IJ SOLN
INTRAMUSCULAR | Status: DC | PRN
  Administered 2021-09-04 (×2): 15 mL via PERINEURAL

## 2021-09-04 MED ORDER — MIDAZOLAM HCL 2 MG/2ML IJ SOLN
INTRAMUSCULAR | Status: AC
  Filled 2021-09-04: qty 2

## 2021-09-04 MED ORDER — ACETAMINOPHEN 500 MG PO TABS
ORAL_TABLET | ORAL | Status: AC
  Administered 2021-09-04: 1000 mg via ORAL
  Filled 2021-09-04: qty 2

## 2021-09-04 MED ORDER — CHLORHEXIDINE GLUCONATE 0.12 % MT SOLN
15.0000 mL | Freq: Once | OROMUCOSAL | Status: AC
Start: 2021-09-04 — End: 2021-09-04

## 2021-09-04 MED ORDER — LIDOCAINE-EPINEPHRINE 1 %-1:100000 IJ SOLN
INTRAMUSCULAR | Status: DC | PRN
  Administered 2021-09-04: 10 mL

## 2021-09-04 MED ORDER — LIDOCAINE 2% (20 MG/ML) 5 ML SYRINGE
INTRAMUSCULAR | Status: AC
  Filled 2021-09-04: qty 5

## 2021-09-04 MED ORDER — PHENOL 1.4 % MT LIQD: 1.0000 | OROMUCOSAL | Status: DC | PRN

## 2021-09-04 MED ORDER — 0.9 % SODIUM CHLORIDE (POUR BTL) OPTIME
TOPICAL | Status: DC | PRN
  Administered 2021-09-04: 1000 mL

## 2021-09-04 MED ORDER — FENTANYL CITRATE (PF) 100 MCG/2ML IJ SOLN: 25.0000 ug | INTRAMUSCULAR | Status: DC | PRN

## 2021-09-04 MED ORDER — ZINC SULFATE 220 (50 ZN) MG PO CAPS: 220.0000 mg | ORAL_CAPSULE | Freq: Every day | ORAL | Status: DC

## 2021-09-04 MED ORDER — LIDOCAINE-EPINEPHRINE 1 %-1:100000 IJ SOLN
INTRAMUSCULAR | Status: AC
  Filled 2021-09-04: qty 1

## 2021-09-04 MED ORDER — LIDOCAINE 2% (20 MG/ML) 5 ML SYRINGE
INTRAMUSCULAR | Status: DC | PRN
  Administered 2021-09-04: 100 mg via INTRAVENOUS

## 2021-09-04 SURGICAL SUPPLY — 38 items
BAG COUNTER SPONGE SURGICOUNT (BAG) ×4 IMPLANT
BAG SPNG CNTER NS LX DISP (BAG) ×2
BLADE SAW RECIP 87.9 MT (BLADE) ×1 IMPLANT
BLADE SURG 21 STRL SS (BLADE) ×4 IMPLANT
BNDG COHESIVE 6X5 TAN NS LF (GAUZE/BANDAGES/DRESSINGS) ×1 IMPLANT
CANISTER WOUND CARE 500ML ATS (WOUND CARE) ×5 IMPLANT
COVER SURGICAL LIGHT HANDLE (MISCELLANEOUS) ×4 IMPLANT
DRAPE DERMATAC (DRAPES) ×2 IMPLANT
DRAPE EXTREMITY T 121X128X90 (DISPOSABLE) ×4 IMPLANT
DRAPE HALF SHEET 40X57 (DRAPES) ×4 IMPLANT
DRAPE INCISE IOBAN 66X45 STRL (DRAPES) ×4 IMPLANT
DRAPE U-SHAPE 47X51 STRL (DRAPES) ×8 IMPLANT
DRESSING PREVENA PLUS CUSTOM (GAUZE/BANDAGES/DRESSINGS) ×3 IMPLANT
DRSG PREVENA PLUS CUSTOM (GAUZE/BANDAGES/DRESSINGS) ×3
DURAPREP 26ML APPLICATOR (WOUND CARE) ×4 IMPLANT
ELECT REM PT RETURN 9FT ADLT (ELECTROSURGICAL) ×3
ELECTRODE REM PT RTRN 9FT ADLT (ELECTROSURGICAL) ×3 IMPLANT
GLOVE BIOGEL PI IND STRL 9 (GLOVE) ×3 IMPLANT
GLOVE BIOGEL PI INDICATOR 9 (GLOVE) ×1
GLOVE SURG ORTHO 9.0 STRL STRW (GLOVE) ×4 IMPLANT
GOWN STRL REUS W/ TWL XL LVL3 (GOWN DISPOSABLE) ×6 IMPLANT
GOWN STRL REUS W/TWL XL LVL3 (GOWN DISPOSABLE) ×6
KIT BASIN OR (CUSTOM PROCEDURE TRAY) ×4 IMPLANT
KIT TURNOVER KIT B (KITS) ×4 IMPLANT
MANIFOLD NEPTUNE II (INSTRUMENTS) ×4 IMPLANT
NS IRRIG 1000ML POUR BTL (IV SOLUTION) ×4 IMPLANT
PACK GENERAL/GYN (CUSTOM PROCEDURE TRAY) ×4 IMPLANT
PAD ARMBOARD 7.5X6 YLW CONV (MISCELLANEOUS) ×4 IMPLANT
PREVENA INCISION MGT 90 150 (MISCELLANEOUS) ×1 IMPLANT
PREVENA RESTOR ARTHOFORM 46X30 (CANNISTER) ×4 IMPLANT
SPONGE T-LAP 18X18 ~~LOC~~+RFID (SPONGE) ×1 IMPLANT
STAPLER VISISTAT 35W (STAPLE) ×2 IMPLANT
SUT ETHILON 2 0 PSLX (SUTURE) ×8 IMPLANT
SUT SILK 2 0 (SUTURE)
SUT SILK 2-0 18XBRD TIE 12 (SUTURE) IMPLANT
SUT VIC AB 1 CTX 36 (SUTURE) ×6
SUT VIC AB 1 CTX36XBRD ANBCTRL (SUTURE) IMPLANT
TOWEL GREEN STERILE (TOWEL DISPOSABLE) ×4 IMPLANT

## 2021-09-04 NOTE — Op Note (Signed)
09/04/2021  4:01 PM  PATIENT:  [name]    PRE-OPERATIVE DIAGNOSIS:  Abscess Left Leg status post below-knee amputation  POST-OPERATIVE DIAGNOSIS:  Same  PROCEDURE:  LEFT  ABOVE KNEE AMPUTATION, APPLICATION OF WOUND VAC  SURGEON:  [name], MD  PHYSICIAN ASSISTANT:None ANESTHESIA:   General  PREOPERATIVE INDICATIONS:  [name] is a  51 y.o. male with a diagnosis of Abscess Left Leg who failed conservative measures and elected for surgical management.    The risks benefits and alternatives were discussed with the patient preoperatively including but not limited to the risks of infection, bleeding, nerve injury, cardiopulmonary complications, the need for revision surgery, among others, and the patient was willing to proceed.  OPERATIVE IMPLANTS: None  '@ENCIMAGES'$ @  OPERATIVE FINDINGS: Patient had purulence draining from the below-knee amputation.  At this time it was determined to proceed with an above-the-knee amputation.  OPERATIVE PROCEDURE: Patient was brought the operating room and underwent a general anesthetic.  After adequate levels anesthesia were obtained patient's left lower extremity was prepped using DuraPrep draped into a sterile field a timeout was called.  A fishmouth incision was made just proximal to the quad tendon.  This was carried sharply down to the intermuscular septum.  The vascular bundles medially were clamped, a reciprocating saw was used to transect the femur.  The amputation was completed the femoral vessels were suture-ligated with 2-0 silk.  Electrocautery was used for further hemostasis.  The wound was irrigated with normal saline.  The deep and superficial fascial layers were closed using #1 Vicryl.  The skin was closed using approximate staples.  A customizable Prevena wound VAC sponge was applied outlined with Ioban and derma tack this had a good suction fit.  Patient was extubated taken the PACU in stable condition.   DISCHARGE  PLANNING:  Antibiotic duration: Continue antibiotics for 24 hours  Weightbearing: Nonweightbearing on the left  Pain medication: Opioid pathway  Dressing care/ Wound VAC: Continue wound VAC for 1 week  Ambulatory devices: Walker or crutches  Discharge to: Discharge to inpatient versus outpatient rehab.  Follow-up: In the office 1 week post operative.

## 2021-09-04 NOTE — Anesthesia Procedure Notes (Signed)
Anesthesia Regional Block: Sciatic   Pre-Anesthetic Checklist: , timeout performed,  Correct Patient, Correct Site, Correct Laterality,  Correct Procedure, Correct Position, site marked,  Risks and benefits discussed,  Surgical consent,  Pre-op evaluation,  At surgeon's request and post-op pain management  Laterality: Left  Prep: chloraprep       Needles:  Injection technique: Single-shot  Needle Type: Echogenic Stimulator Needle     Needle Length: 10cm  Needle Gauge: 20     Additional Needles:   Procedures:,,,, ultrasound used (permanent image in chart),,    Narrative:  Start time: 09/04/2021 1:46 PM End time: 09/04/2021 1:50 PM Injection made incrementally with aspirations every 5 mL.  Performed by: Personally  Anesthesiologist: Lidia Collum, MD  Additional Notes: Standard monitors applied. Skin prepped. Good needle visualization with ultrasound. Injection made in 5cc increments with no resistance to injection. Patient tolerated the procedure well.

## 2021-09-04 NOTE — Progress Notes (Signed)
Pts s/o Rollene Fare updated pt going to surgery

## 2021-09-04 NOTE — Interval H&P Note (Signed)
History and Physical Interval Note:  09/04/2021 7:04 AM  [name]  has presented today for surgery, with the diagnosis of Abscess Left Leg.  The various methods of treatment have been discussed with the patient and family. After consideration of risks, benefits and other options for treatment, the patient has consented to  Procedure(s): LEFT BELOW KNEE AMPUTATION REVISION VS. [place] (Left) as a surgical intervention.  The patient's history has been reviewed, patient examined, no change in status, stable for surgery.  I have reviewed the patient's chart and labs.  Questions were answered to the patient's satisfaction.     [name]

## 2021-09-04 NOTE — Procedures (Signed)
Pre procedural Diagnosis: Poor venous access Post Procedural Diagnosis: Same  Successful placement of right jugular approach tunneled dual CVC line with tip at the superior caval-atrial junction.    EBL: None  No immediate post procedural complication.  The CVC is ready for immediate use.  Ronny Bacon, MD Pager #: [phone]
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
## 2021-09-04 NOTE — Progress Notes (Signed)
Patient ID: [name], male   DOB: Jun 20, 1970, 51 y.o.   MRN: [id number] S: No new complaints O:BP (!) 152/74 (BP Location: Left Arm)   Pulse 75   Temp (!) 97.5 F (36.4 C) (Oral)   Resp 17   Ht 5' 4"  (1.626 m)   Wt 87.5 kg   SpO2 100%   BMI 33.11 kg/m   Intake/Output Summary (Last 24 hours) at 09/04/2021 0944 Last data filed at 09/04/2021 0338 Gross per 24 hour  Intake 2749.5 ml  Output 700 ml  Net 2049.5 ml   Intake/Output: I/O last 3 completed shifts: In: 2875.3 [P.O.:960; I.V.:1654.3; IV Piggyback:261] Out: 2100 [Urine:2100]  Intake/Output this shift:  No intake/output data recorded. Weight change:  Gen: NAD CVS: RRR Resp:CTA Abd: +BS, soft, NT/ND Ext: s/p left BKA, some blood seen on dressing, no edema of RLE, s/p RTMA  Recent Labs  Lab 08/29/21 1213 08/30/21 0520 08/31/21 0449 09/01/21 0138 09/02/21 0212 09/03/21 0149 09/04/21 0755  NA 134* 139 138 136 139 136 142  K 4.6 4.1 4.9 4.5 3.8 4.9 4.1  CL 107 113* 114* 113* 107 110 108  CO2 13* 15* 14* 16* 18* 18* 22  GLUCOSE 250* 149* 233* 204* 28* 231* 112*  BUN 109* 103* 108* 113* 113* 112* 97*  CREATININE 5.21* 4.23* 3.84* 4.07* 3.78* 3.69* 3.36*  ALBUMIN 2.4* 2.0* 1.8* 1.6* 1.5* <1.5* <1.5*  <1.5*  CALCIUM 9.1 8.4* 8.3* 8.0* 7.9* 7.4* 7.9*  PHOS  --   --   --   --   --   --  3.8  AST 31 22 47* 32 28 42* 23  ALT 46* 30 43 41 31 36 19   Liver Function Tests: Recent Labs  Lab 09/02/21 0212 09/03/21 0149 09/04/21 0755  AST 28 42* 23  ALT 31 36 19  ALKPHOS 124 101 97  BILITOT 1.2 1.0 0.7  PROT 5.9* 5.7* 5.8*  ALBUMIN 1.5* <1.5* <1.5*  <1.5*   No results for input(s): "LIPASE", "AMYLASE" in the last 168 hours. No results for input(s): "AMMONIA" in the last 168 hours. CBC: Recent Labs  Lab 08/29/21 1200 08/30/21 0520 08/31/21 0449 09/01/21 0138 09/02/21 0212 09/03/21 0149 09/04/21 0755  WBC 42.6*   < > 50.9* 50.5* 45.5* 46.8* 39.1*  NEUTROABS 35.7*  --   --   --   --   --   --   HGB  11.2*   < > 10.2* 9.8* 9.6* 10.1* 9.0*  HCT 33.4*   < > 29.7* 27.5* 26.9* 28.7* 27.1*  MCV 83.3   < > 80.1 77.5* 77.1* 78.6* 80.7  PLT 461*   < > 478* 494* 489* 463* 458*   < > = values in this interval not displayed.   Cardiac Enzymes: No results for input(s): "CKTOTAL", "CKMB", "CKMBINDEX", "TROPONINI" in the last 168 hours. CBG: Recent Labs  Lab 09/03/21 1657 09/03/21 2158 09/04/21 0109 09/04/21 0436 09/04/21 0903  GLUCAP 155* 147* 149* 117* 107*    Iron Studies: No results for input(s): "IRON", "TIBC", "TRANSFERRIN", "FERRITIN" in the last 72 hours. Studies/Results: No results found.  amLODipine  10 mg Oral Daily   vitamin C  1,000 mg Oral Daily   aspirin EC  81 mg Oral Daily   carvedilol  25 mg Oral BID WC   chlorhexidine  60 mL Topical Once   docusate sodium  100 mg Oral Daily   feeding supplement  237 mL Oral BID BM   ferrous sulfate  325 mg Oral Q breakfast   gabapentin  300 mg Oral QHS   insulin aspart  0-15 Units Subcutaneous Q4H   multivitamin with minerals  1 tablet Oral Daily   mupirocin ointment  1 application  Nasal BID   nutrition supplement (JUVEN)  1 packet Oral BID BM   pantoprazole  40 mg Oral Daily   povidone-iodine  2 application  Topical Once   zinc sulfate  220 mg Oral Daily    BMET    Component Value Date/Time   NA 142 09/04/2021 0755   NA 138 05/02/2020 0933   K 4.1 09/04/2021 0755   CL 108 09/04/2021 0755   CO2 22 09/04/2021 0755   GLUCOSE 112 (H) 09/04/2021 0755   BUN 97 (H) 09/04/2021 0755   BUN 22 05/02/2020 0933   CREATININE 3.36 (H) 09/04/2021 0755   CREATININE 2.79 (H) 01/13/2021 0939   CREATININE 3.14 (H) 10/17/2020 1511   CALCIUM 7.9 (L) 09/04/2021 0755   GFRNONAA 21 (L) 09/04/2021 0755   GFRNONAA 27 (L) 01/13/2021 0939   GFRNONAA 29 (L) 09/04/2020 1545   GFRAA 33 (L) 09/04/2020 1545   CBC    Component Value Date/Time   WBC 39.1 (H) 09/04/2021 0755   RBC 3.36 (L) 09/04/2021 0755   HGB 9.0 (L) 09/04/2021 0755   HGB  10.3 (L) 01/13/2021 0939   HGB 10.8 (L) 06/24/2020 1055   HCT 27.1 (L) 09/04/2021 0755   HCT 33.5 (L) 06/24/2020 1055   PLT 458 (H) 09/04/2021 0755   PLT 310 01/13/2021 0939   PLT 437 06/24/2020 1055   MCV 80.7 09/04/2021 0755   MCV 84 06/24/2020 1055   MCH 26.8 09/04/2021 0755   MCHC 33.2 09/04/2021 0755   RDW 15.8 (H) 09/04/2021 0755   RDW 14.1 06/24/2020 1055   LYMPHSABS 1.7 08/29/2021 1200   LYMPHSABS 3.3 (H) 06/24/2020 1055   MONOABS 3.2 (H) 08/29/2021 1200   EOSABS 0.0 08/29/2021 1200   EOSABS 0.3 06/24/2020 1055   BASOSABS 0.2 (H) 08/29/2021 1200   BASOSABS 0.1 06/24/2020 1055     Assessment/Plan:  AKI/CKD stage IV, non-oliguric - BUN/Cr slowly improving.  He is on Vanc and trough level was a little high at 20, so may be related to early vanc toxicity.  Last dose given 09/02/21.  Will recheck level today.  He is asymptomatic.  BP's were soft when he first came in so ATN possibility.  He also admitted taking ibuprofen at home prior to admission for foot pain.  BUN/Cr continue to improve slowly.  No indication for dialysis at this time and will order renal US and urine studies.  Continue to follow closely Renal dose meds Avoid nephrotoxic agents such as IV contrast, NSAIDs, phosphate containing bowel preps. Will decrease gabapentin to 300 mg qhs given eGFR of 17 ml/min. Preferred narcotic agents for pain control are hydromorphone, fentanyl, and  methadone. Morphine should not be used. Avoid Baclofen  Osteomyelitis of left foot - on vanco, cefepime, and flagyl.  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  ID consulted and recommended to change to daptomycin pluse cefazolin for 4 weeks.  No PICC but ok for tunneled central line for prolonged Abx.  Stopping vancomycin.  Severe protein-calorie malnutrition.  ABLA/Anemia of CKD stage IV and iron deficiency - will check iron stores.  Transfuse for hgb <7.  Will likely need to start ESA.  Drop in Hgb following surgery CAD - cont asa, BB.  Statin on hold due to  elevated LFT's HTN -  stable Non-anion gap metabolic acidosis - due to CKD.  Currently on bicarb, continue and follow.  Severe protein malnutrition - albumin <1.5.  supplement with Nepro.    Donetta Potts, MD [facility]

## 2021-09-04 NOTE — Anesthesia Postprocedure Evaluation (Signed)
Anesthesia Post Note  Patient: [name]  Procedure(s) Performed: LEFT  ABOVE KNEE AMPUTATION (Left) APPLICATION OF WOUND VAC (Left: Leg Lower)     Patient location during evaluation: PACU Anesthesia Type: General Level of consciousness: awake and alert Pain management: pain level controlled Vital Signs Assessment: post-procedure vital signs reviewed and stable Respiratory status: spontaneous breathing, nonlabored ventilation and respiratory function stable Cardiovascular status: blood pressure returned to baseline and stable Postop Assessment: no apparent nausea or vomiting Anesthetic complications: no   No notable events documented.  Last Vitals:  Vitals:   09/04/21 1615 09/04/21 1620  BP:  133/78  Pulse: 70 72  Resp: 13 10  Temp: 36.4 C   SpO2: 93% 96%    Last Pain:  Vitals:   09/04/21 1620  TempSrc:   PainSc: 0-No pain                 Lidia Collum

## 2021-09-04 NOTE — Anesthesia Preprocedure Evaluation (Addendum)
Anesthesia Evaluation  Patient identified by MRN, date of birth, ID band Patient awake    Reviewed: Allergy & Precautions, NPO status , Patient's Chart, lab work & pertinent test results  History of Anesthesia Complications Negative for: history of anesthetic complications  Airway Mallampati: II  TM Distance: >3 FB Neck ROM: Full    Dental  (+) Dental Advisory Given   Pulmonary Current Smoker and Patient abstained from smoking.,    Pulmonary exam normal        Cardiovascular hypertension, + CAD  Normal cardiovascular exam+ Valvular Problems/Murmurs (mitral endocarditis)      Neuro/Psych negative neurological ROS     GI/Hepatic negative GI ROS, Neg liver ROS,   Endo/Other  diabetes, Poorly Controlled, Type 2  Renal/GU CRFRenal disease  negative genitourinary   Musculoskeletal negative musculoskeletal ROS (+)   Abdominal   Peds  Hematology  (+) Blood dyscrasia, anemia ,   Anesthesia Other Findings   Reproductive/Obstetrics                           Anesthesia Physical Anesthesia Plan  ASA: 3  Anesthesia Plan: General   Post-op Pain Management:    Induction: Intravenous  PONV Risk Score and Plan: Ondansetron, Dexamethasone, Midazolam and Treatment may vary due to age or medical condition  Airway Management Planned: LMA  Additional Equipment: None  Intra-op Plan:   Post-operative Plan: Extubation in OR  Informed Consent: I have reviewed the patients History and Physical, chart, labs and discussed the procedure including the risks, benefits and alternatives for the proposed anesthesia with the patient or authorized representative who has indicated his/her understanding and acceptance.     Dental advisory given  Plan Discussed with:   Anesthesia Plan Comments:        Anesthesia Quick Evaluation

## 2021-09-04 NOTE — Anesthesia Procedure Notes (Signed)
Procedure Name: Intubation Date/Time: 09/04/2021 3:02 PM  Performed by: Maude Leriche, CRNAPre-anesthesia Checklist: Patient identified, Emergency Drugs available, Suction available and Patient being monitored Patient Re-evaluated:Patient Re-evaluated prior to induction Oxygen Delivery Method: Circle system utilized Preoxygenation: Pre-oxygenation with 100% oxygen Induction Type: IV induction and Rapid sequence Laryngoscope Size: 3 and Glidescope Grade View: Grade I Tube type: Oral Tube size: 7.5 mm Number of attempts: 1 Airway Equipment and Method: Stylet and Oral airway Placement Confirmation: ETT inserted through vocal cords under direct vision, positive ETCO2 and breath sounds checked- equal and bilateral Secured at: 22 cm Tube secured with: Tape Dental Injury: Teeth and Oropharynx as per pre-operative assessment  Comments: Elective glidescope for poor dentition & active nausea

## 2021-09-04 NOTE — Transfer of Care (Signed)
Immediate Anesthesia Transfer of Care Note  Patient: [name]  Procedure(s) Performed: LEFT  ABOVE KNEE AMPUTATION (Left) APPLICATION OF WOUND VAC (Left: Leg Lower)  Patient Location: PACU  Anesthesia Type:GA combined with regional for post-op pain  Level of Consciousness: awake, alert  and oriented  Airway & Oxygen Therapy: Patient Spontanous Breathing  Post-op Assessment: Report given to RN, Post -op Vital signs reviewed and stable, Patient moving all extremities X 4 and Patient able to stick tongue midline  Post vital signs: Reviewed  Last Vitals:  Vitals Value Taken Time  BP 121/75   Temp 98.4   Pulse 73   Resp 14   SpO2 99     Last Pain:  Vitals:   09/04/21 1233  TempSrc:   PainSc: 0-No pain      Patients Stated Pain Goal: 3 (89/37/34 2876)  Complications: No notable events documented.

## 2021-09-04 NOTE — Anesthesia Procedure Notes (Signed)
Anesthesia Regional Block: Femoral nerve block   Pre-Anesthetic Checklist: , timeout performed,  Correct Patient, Correct Site, Correct Laterality,  Correct Procedure, Correct Position, site marked,  Risks and benefits discussed,  Surgical consent,  Pre-op evaluation,  At surgeon's request and post-op pain management  Laterality: Left  Prep: chloraprep       Needles:  Injection technique: Single-shot  Needle Type: Echogenic Stimulator Needle     Needle Length: 10cm  Needle Gauge: 20     Additional Needles:   Procedures:,,,, ultrasound used (permanent image in chart),,    Narrative:  Start time: 09/04/2021 1:50 PM End time: 09/04/2021 1:54 PM Injection made incrementally with aspirations every 5 mL.  Performed by: Personally  Anesthesiologist: Lidia Collum, MD  Additional Notes: Standard monitors applied. Skin prepped. Good needle visualization with ultrasound. Injection made in 5cc increments with no resistance to injection. Patient tolerated the procedure well.

## 2021-09-04 NOTE — Progress Notes (Signed)
PROGRESS NOTE    [name]  YCX:[id number] DOB: Feb 10, 1971 DOA: 08/29/2021 PCP: Camillia Herter, NP    Chief Complaint  Patient presents with   Fall    Brief Narrative:  [name] is a 51 y.o. 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  He was evaluated by surgery who recommended transtibial amputation.   6/3: Admitted, Cr 5.2 on admission (from baseline 2.5); Podiatry evaluated, MRI showed osteo, no salvage operations were judged possible 6/5: Transferred to [facility], ABIs done, inconclusive    Assessment & Plan:  Principal Problem:   Diabetic ulcer of left foot associated with type 2 diabetes mellitus ([place]) Active Problems:   Osteomyelitis of foot, acute ([place])   Acute kidney injury superimposed on CKD ([place])   Essential hypertension   Coronary atherosclerosis of native coronary artery   Anemia in chronic kidney disease (CKD)   Type 2 diabetes mellitus with hyperglycemia ([place])   Mixed hyperlipidemia   Severe protein-calorie malnutrition ([name])   Transaminitis   CKD (chronic kidney disease), stage IV ([name])    Assessment and Plan: * Diabetic ulcer of left foot associated with type 2 diabetes mellitus ([place])    Osteomyelitis of foot, acute ([place]) - 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  -Op note with findings of purulent abscess that extended up to the popliteal fossa with necrotic muscle along the medial head of the gastrocnemius muscle and medial soleus.   -ID consulted and recommended discontinuation of IV vancomycin and IV cefepime and placing patient on daptomycin plus cefazolin.  -ID recommending at least 4 weeks of IV antibiotics and recommending to check a sed rate and CRP. -Due to renal function unable to place a PICC at this time, nephrology recommending tunneled central line for  prolonged antibiotics and in agreement with stopping vancomycin. -IR consult placed for tunneled central line placement. -Appreciate ID input and recommendations as well as orthopedics input and recommendations. -Patient for left lower extremity procedure today 09/04/2021 per orthopedics.  - Continue nutritional supplements, vitamins  Acute kidney injury superimposed on CKD ([place]) Renal function 5.2 on admission, up from basleine around 2.5-2.8 since Jan. Here, was initially improving, but in last day, despite bicarb fluids, trending back up >4 mg/dL, in setting of vancomycin use. -Patient also noted to be taking NSAIDs prior to admission. - Patient seen in consultation by nephrology and patient currently on bicarb. -Renal function slowly improving with creatinine down to 3.36 today. -Avoid nephrotoxic agents.  -Per nephrology.   CKD (chronic kidney disease), stage IV ([place]) Cr seems to have worsened since about Jan when he had endocarditis. Now 2.5-2.8 baseline. - Nephrology following.  Transaminitis -Total bilirubin has improved currently down to 0.7 -Follow.  Severe protein-calorie malnutrition ([place]) - Continue nutritional supplementation with Ensure.  Mixed hyperlipidemia - Repeat LFTs stable.   -Resume statin tomorrow.    Type 2 diabetes mellitus with hyperglycemia ([name]) - Hemoglobin A1c 7.5 (08/30/2021 ). -CBG 28 of basic metabolic profile the morning of 09/02/2021. -Patient noted to have received Semglee while n.p.o., which has been discontinued. -CBGs have improved and CBG of 107 this morning. -Discontinued D5 normal saline. -Discontinue Semglee as patient currently n.p.o. -SSI.  Anemia in chronic kidney disease (CKD) Hgb stable at 9.0. - Continue iron -Follow.  Iron deficiency anemia - Continue iron  Coronary atherosclerosis of native coronary artery - Continue aspirin, BB - Resume statin tomorrow.    Essential hypertension - Blood pressure controlled on current  regimen of amlodipine, Coreg.   -Continue to hold home regimen clonidine.           DVT prophylaxis: Per orthopedics, patient back to the OR today 09/04/2021.   Code Status: Full Family Communication: Updated patient, no family at bedside Disposition: TBD  Status is: Inpatient Remains inpatient appropriate because: Severity of illness   Consultants:  Orthopedics: Dr. Sharol Given 08/31/2021 Podiatry: Dr. Jacqualyn Posey 08/29/2021 Nephrology: Dr. Marval Regal 09/01/2021 Infectious disease: Dr. Baxter Flattery 09/03/2021  Procedures:  Plain films of the left foot 09/25/2021 MRI left foot 08/29/2021 Renal ultrasound 09/01/2021 ABI bilateral 08/31/2021 Left BKA per orthopedics: Dr. Sharol Given 09/02/2021 Irrigation debridement left foot, bone biopsy.  Podiatry: Dr. Jacqualyn Posey 08/30/2021   Antimicrobials:  Oral Flagyl 08/29/2021>>>> 09/05/2021 IV cefepime 08/29/2021>>>> 09/03/2021 IV vancomycin 08/29/2021>>> 09/03/2021 IV daptomycin 09/03/2021>>>> IV Ancef 09/03/2021>>>>>    Subjective: Laying in bed getting cleaned up.  States he ate yesterday.  No chest pain.  No shortness of breath.  No abdominal pain.  Complain of some pain in the left lower extremity.  Awaiting to go to surgery.  Objective: Vitals:   09/03/21 2156 09/04/21 0500 09/04/21 0900 09/04/21 1204  BP: (!) 153/82 (!) 147/80 (!) 152/74 (!) 164/82  Pulse: 77 73 75 86  Resp: '16 16 17 18  '$ Temp: 98.6 F (37 C) (!) 97.5 F (36.4 C) (!) 97.5 F (36.4 C) 98.3 F (36.8 C)  TempSrc:  Oral Oral Oral  SpO2: 100% 100% 100% 100%  Weight:    88.5 kg  Height:    '5\' 4"'$  (1.626 m)    Intake/Output Summary (Last 24 hours) at 09/04/2021 1223 Last data filed at 09/04/2021 0338 Gross per 24 hour  Intake 2009.5 ml  Output 700 ml  Net 1309.5 ml   Filed Weights   08/29/21 1125 09/02/21 1206 09/04/21 1204  Weight: 87.5 kg 87.5 kg 88.5 kg    Examination:  General exam: NAD. Respiratory system: Lungs clear to auscultation bilaterally anterior lung fields.  No wheezes, no crackles, no  rhonchi.  Fair air movement.  Speaking in full sentences.  Cardiovascular system: Regular rate rhythm no murmurs rubs or gallops.  No JVD.  Gastrointestinal system: Abdomen is nondistended, soft and nontender. No organomegaly or masses felt. Normal bowel sounds heard. Central nervous system: Alert and oriented. No focal neurological deficits. Extremities: Status post left BKA with postop bandage in place with some serosanguineous drainage noted.  Status post right transmetatarsal amputation.  Skin: No rashes, lesions or ulcers Psychiatry: Judgement and insight appear normal. Mood & affect appropriate.     Data Reviewed:   CBC: Recent Labs  Lab 08/29/21 1200 08/30/21 0520 08/31/21 0449 09/01/21 0138 09/02/21 0212 09/03/21 0149 09/04/21 0755  WBC 42.6*   < > 50.9* 50.5* 45.5* 46.8* 39.1*  NEUTROABS 35.7*  --   --   --   --   --   --   HGB 11.2*   < > 10.2* 9.8* 9.6* 10.1* 9.0*  HCT 33.4*   < > 29.7* 27.5* 26.9* 28.7* 27.1*  MCV 83.3   < > 80.1 77.5* 77.1* 78.6* 80.7  PLT 461*   < > 478* 494* 489* 463* 458*   < > = values in this interval not displayed.    Basic Metabolic Panel: Recent Labs  Lab 08/31/21 0449 09/01/21 0138 09/02/21 9528 09/03/21 0149 09/04/21 4132  NA 138 136 139 136 142  K 4.9 4.5 3.8 4.9 4.1  CL 114* 113* 107 110 108  CO2 14* 16* 18* 18* 22  GLUCOSE 233* 204* 28* 231* 112*  BUN 108* 113* 113* 112* 97*  CREATININE 3.84* 4.07* 3.78* 3.69* 3.36*  CALCIUM 8.3* 8.0* 7.9* 7.4* 7.9*  MG 2.5* 2.3  --   --   --   PHOS  --   --   --   --  3.8    GFR: Estimated Creatinine Clearance: 26.1 mL/min (A) (by C-G formula based on SCr of 3.36 mg/dL (H)).  Liver Function Tests: Recent Labs  Lab 08/31/21 0449 09/01/21 0138 09/02/21 0212 09/03/21 0149 09/04/21 0755  AST 47* 32 28 42* 23  ALT 43 41 31 36 19  ALKPHOS 107 110 124 101 97  BILITOT 2.0* 1.3* 1.2 1.0 0.7  PROT 6.5 5.9* 5.9* 5.7* 5.8*  ALBUMIN 1.8* 1.6* 1.5* <1.5* <1.5*  <1.5*    CBG: Recent  Labs  Lab 09/03/21 1657 09/03/21 2158 09/04/21 0109 09/04/21 0436 09/04/21 0903  GLUCAP 155* 147* 149* 117* 107*     Recent Results (from the past 240 hour(s))  Blood culture (routine x 2)     Status: None   Collection Time: 08/29/21 12:13 PM   Specimen: BLOOD  Result Value Ref Range Status   Specimen Description   Final    BLOOD RIGHT ANTECUBITAL Performed at [facility], [place] [address], [place], [place] [address]    Special Requests   Final    BOTTLES DRAWN AEROBIC AND ANAEROBIC Blood Culture results may not be optimal due to an excessive volume of blood received in culture bottles Performed at [place] [address], [place], [place] [address]    Culture   Final    NO GROWTH 5 DAYS Performed at [facility], [place] [address] [place], [place] [address]    Report Status 09/03/2021 FINAL  Final  Aerobic/Anaerobic Culture w Gram Stain (surgical/deep wound)     Status: None   Collection Time: 08/29/21 12:35 PM   Specimen: Wound  Result Value Ref Range Status   Specimen Description   Final    WOUND LEFT FOOT Performed at [place] [address], [place], [place] [address]    Special Requests   Final    NONE Performed at [facility], [place] [address], [place], [place] [address]    Gram Stain   Final    NO SQUAMOUS EPITHELIAL CELLS SEEN NO WBC SEEN MODERATE GRAM NEGATIVE RODS MODERATE GRAM POSITIVE COCCI    Culture   Final    ABUNDANT PROTEUS MIRABILIS MODERATE ENTEROCOCCUS FAECALIS NO ANAEROBES ISOLATED Performed at [facility], [address], [place], [place] [address]    Report Status 09/03/2021 FINAL  Final   Organism ID, Bacteria PROTEUS MIRABILIS  Final   Organism ID, Bacteria ENTEROCOCCUS FAECALIS  Final      Susceptibility   Enterococcus faecalis - MIC*    AMPICILLIN <=2 SENSITIVE Sensitive     VANCOMYCIN 1 SENSITIVE Sensitive     GENTAMICIN SYNERGY  SENSITIVE Sensitive     * MODERATE ENTEROCOCCUS FAECALIS   Proteus mirabilis - MIC*    AMPICILLIN <=2 SENSITIVE Sensitive     CEFAZOLIN <=4 SENSITIVE Sensitive     CEFEPIME <=0.12 SENSITIVE Sensitive     CEFTAZIDIME <=1 SENSITIVE Sensitive     CEFTRIAXONE <=0.25 SENSITIVE Sensitive  CIPROFLOXACIN <=0.25 SENSITIVE Sensitive     GENTAMICIN <=1 SENSITIVE Sensitive     IMIPENEM 2 SENSITIVE Sensitive     TRIMETH/SULFA <=20 SENSITIVE Sensitive     AMPICILLIN/SULBACTAM <=2 SENSITIVE Sensitive     PIP/TAZO <=4 SENSITIVE Sensitive     * ABUNDANT PROTEUS MIRABILIS  Blood culture (routine x 2)     Status: None (Preliminary result)   Collection Time: 08/29/21  6:10 PM   Specimen: BLOOD  Result Value Ref Range Status   Specimen Description   Final    BLOOD BLOOD RIGHT HAND Performed at [name] [address] [place], [place] [address]    Special Requests   Final    BOTTLES DRAWN AEROBIC ONLY Blood Culture results may not be optimal due to an inadequate volume of blood received in culture bottles Performed at [place] [address] [place], [place] [address]    Culture   Final    NO GROWTH 4 DAYS Performed at [facility], [place] [address] [place], [place] [address]    Report Status PENDING  Incomplete  Aerobic/Anaerobic Culture w Gram Stain (surgical/deep wound)     Status: None (Preliminary result)   Collection Time: 08/30/21  9:53 AM   Specimen: Wound  Result Value Ref Range Status   Specimen Description   Final    WOUND LEFT FOOT WOUND Performed at [facility], [place] [address] [place], [place] [address]    Special Requests   Final    NONE Performed at [facility], [place], [place] [address] [place], [place] [address]    Gram Stain   Final    RARE WBC PRESENT, PREDOMINANTLY PMN MODERATE GRAM POSITIVE COCCI IN PAIRS MODERATE GRAM VARIABLE ROD Performed at [facility], [name] [address]  [place], [place] [address]    Culture   Final    MODERATE PROTEUS MIRABILIS FEW ENTEROCOCCUS FAECALIS NO ANAEROBES ISOLATED; CULTURE IN PROGRESS FOR 5 DAYS    Report Status PENDING  Incomplete   Organism ID, Bacteria PROTEUS MIRABILIS  Final   Organism ID, Bacteria ENTEROCOCCUS FAECALIS  Final      Susceptibility   Enterococcus faecalis - MIC*    AMPICILLIN <=2 SENSITIVE Sensitive     VANCOMYCIN 1 SENSITIVE Sensitive     GENTAMICIN SYNERGY SENSITIVE Sensitive     * FEW ENTEROCOCCUS FAECALIS   Proteus mirabilis - MIC*    AMPICILLIN <=2 SENSITIVE Sensitive     CEFAZOLIN <=4 SENSITIVE Sensitive     CEFEPIME <=0.12 SENSITIVE Sensitive     CEFTAZIDIME <=1 SENSITIVE Sensitive     CEFTRIAXONE <=0.25 SENSITIVE Sensitive     CIPROFLOXACIN <=0.25 SENSITIVE Sensitive     GENTAMICIN <=1 SENSITIVE Sensitive     IMIPENEM 2 SENSITIVE Sensitive     TRIMETH/SULFA <=20 SENSITIVE Sensitive     AMPICILLIN/SULBACTAM <=2 SENSITIVE Sensitive     PIP/TAZO <=4 SENSITIVE Sensitive     * MODERATE PROTEUS MIRABILIS  Surgical PCR screen     Status: None   Collection Time: 09/02/21  5:30 AM   Specimen: Nasal Mucosa; Nasal Swab  Result Value Ref Range Status   MRSA, PCR NEGATIVE NEGATIVE Final   Staphylococcus aureus NEGATIVE NEGATIVE Final    Comment: (NOTE) The Xpert SA Assay (FDA approved for NASAL specimens in patients 7 years of age and older), is one component of a comprehensive surveillance program. It is not intended to diagnose infection nor to guide or monitor  treatment. Performed at [facility], [place] [address], [place], [place] [address]   Aerobic/Anaerobic Culture w Gram Stain (surgical/deep wound)     Status: None (Preliminary result)   Collection Time: 09/02/21  3:07 PM   Specimen: Soft Tissue, Other  Result Value Ref Range Status   Specimen Description WOUND  Final   Special Requests LEFT LEG PUS  Final   Gram Stain   Final    MODERATE WBC PRESENT,BOTH PMN AND MONONUCLEAR FEW  GRAM NEGATIVE RODS FEW GRAM POSITIVE COCCI IN PAIRS FEW GRAM POSITIVE COCCI IN CLUSTERS Performed at [facility], [place] [address], [place], [place] [address]    Culture   Final    ABUNDANT PROTEUS MIRABILIS NO ANAEROBES ISOLATED; CULTURE IN PROGRESS FOR 5 DAYS    Report Status PENDING  Incomplete   Organism ID, Bacteria PROTEUS MIRABILIS  Final      Susceptibility   Proteus mirabilis - MIC*    AMPICILLIN <=2 SENSITIVE Sensitive     CEFAZOLIN <=4 SENSITIVE Sensitive     CEFEPIME <=0.12 SENSITIVE Sensitive     CEFTAZIDIME <=1 SENSITIVE Sensitive     CEFTRIAXONE <=0.25 SENSITIVE Sensitive     CIPROFLOXACIN <=0.25 SENSITIVE Sensitive     GENTAMICIN <=1 SENSITIVE Sensitive     IMIPENEM 2 SENSITIVE Sensitive     TRIMETH/SULFA <=20 SENSITIVE Sensitive     AMPICILLIN/SULBACTAM <=2 SENSITIVE Sensitive     PIP/TAZO <=4 SENSITIVE Sensitive     * ABUNDANT PROTEUS MIRABILIS         Radiology Studies: No results found.      Scheduled Meds:  acetaminophen       acetaminophen  1,000 mg Oral Once   [MAR Hold] amLODipine  10 mg Oral Daily   [MAR Hold] vitamin C  1,000 mg Oral Daily   [MAR Hold] aspirin EC  81 mg Oral Daily   [MAR Hold] carvedilol  25 mg Oral BID WC   chlorhexidine  60 mL Topical Once   chlorhexidine  15 mL Mouth/Throat Once   Or   mouth rinse  15 mL Mouth Rinse Once   chlorhexidine       [MAR Hold] docusate sodium  100 mg Oral Daily   [MAR Hold] feeding supplement  237 mL Oral BID BM   [MAR Hold] ferrous sulfate  325 mg Oral Q breakfast   [MAR Hold] gabapentin  300 mg Oral QHS   heparin lock flush       [MAR Hold] insulin aspart  0-15 Units Subcutaneous Q4H   lidocaine-EPINEPHrine       [MAR Hold] multivitamin with minerals  1 tablet Oral Daily   [MAR Hold] mupirocin ointment  1 application  Nasal BID   [MAR Hold] nutrition supplement (JUVEN)  1 packet Oral BID BM   [MAR Hold] pantoprazole  40 mg Oral Daily   povidone-iodine  2 application  Topical  Once   [MAR Hold] zinc sulfate  220 mg Oral Daily   Continuous Infusions:  sodium chloride     [MAR Hold]  ceFAZolin (ANCEF) IV 2 g (09/04/21 1032)   [START ON 09/05/2021]  ceFAZolin (ANCEF) IV     [MAR Hold] DAPTOmycin (CUBICIN) 550 mg in sodium chloride 0.9 % IVPB 550 mg (09/03/21 2229)   [MAR Hold] magnesium sulfate bolus IVPB     sodium bicarbonate 75 mEq in sodium chloride 0.45 % 1,075 mL infusion 100 mL/hr at 09/04/21 0240   tranexamic acid     tranexamic  acid       LOS: 6 days    Time spent: 35 minutes    Irine Seal, MD [facility]   To contact the attending provider between 7A-7P or the covering provider during after hours 7P-7A, please log into the web site [email or web address] and access using universal [place] password for that web site. If you do not have the password, please call the hospital operator.  09/04/2021, 12:23 PM

## 2021-09-04 NOTE — Social Work (Signed)
Pt at a procedure today, CSW spoke with Brother at bedside and sister by phone. They expressed concern about plans for pt after hospitalization. CSW noted that TOC would have this conversation for pt when he has returned from his procedure. Family stated they would be in to visit tomorrow and would like to have the conversation with all of them. CSW noted she would request the covering CSW visit the family if they are able. CSW began workup in preparation for pt's needs after hospitalization. CSW will see pt if possible today after his procedure. TOC will continue to follow for DC needs.

## 2021-09-04 NOTE — NC FL2 (Signed)
[place] MEDICAID FL2 LEVEL OF CARE SCREENING TOOL     IDENTIFICATION  Patient Name: [name] Birthdate: [birth year] Sex: male Admission Date (Current Location): 08/29/2021  [facility] and [place] Number:  Herbalist and Address:  [facility], [place] [address] [place], [place] [address]      Provider Number: [id number]  Attending Physician Name and Address:  Eugenie Filler, MD  Relative Name and Phone Number:  Awab Abebe [phone]    Current Level of Care: Hospital Recommended Level of Care: [place] Prior Approval Number:    Date Approved/Denied:   PASRR Number: [id number]  Discharge Plan: SNF    Current Diagnoses: Patient Active Problem List   Diagnosis Date Noted   CKD (chronic kidney disease), stage IV ([name]) 09/01/2021   Diabetic ulcer of left foot associated with type 2 diabetes mellitus ([place]) 08/29/2021   Severe protein-calorie malnutrition ([place]) 08/29/2021   Transaminitis 08/29/2021   Endocarditis of mitral valve    Severe sepsis ([place]) 04/21/2021   MSSA bacteremia    Type 2 diabetes mellitus with hyperglycemia ([place]) 04/17/2021   Acute kidney injury superimposed on CKD ([place]) 04/17/2021   Mixed hyperlipidemia 04/17/2021   Eustachian tube dysfunction, bilateral 03/18/2021   Ulcer of right foot with necrosis of bone ([place]) 07/22/2020   Anemia in chronic kidney disease (CKD) 07/05/2020   Wound infection 03/25/2020   Gas gangrene of foot ([place])    Type 2 diabetes mellitus with diabetic polyneuropathy, with long-term current use of insulin ([place]) 07/16/2019   Type 2 diabetes mellitus with stage 3a chronic kidney disease, with long-term current use of insulin ([place]) 07/16/2019   Diabetic foot ulcer ([place]) 06/29/2019   Stage 3b chronic kidney disease (CKD) ([place]) 01/24/2019   Obesity 01/24/2019   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[place]) 11/13/2018   Diabetic foot ([place]) 10/17/2018   Non-pressure chronic ulcer of other part  of right foot limited to breakdown of skin ([place]) 09/11/2018   Type 2 diabetes mellitus with proliferative retinopathy of both eyes, without long-term current use of insulin ([place]) 07/14/2018   Diabetic infection of left foot ([place]) 07/14/2018   Wound cellulitis 05/22/2018   Osteomyelitis of foot, acute ([place]) 05/21/2018   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 ([place])    Diabetic ulcer of left midfoot associated with diabetes mellitus due to underlying condition, with fat layer exposed ([place]) 08/26/2017   B12 deficiency 06/15/2017   Vitamin D deficiency 06/03/2017   Iron deficiency anemia 06/01/2017   Diabetic foot infection ([place])    Vitreous floaters of right eye 09/15/2016   Non compliance w medication regimen 09/15/2016   Depression 09/01/2016   Foot ulcer due to secondary DM ([place]) 08/20/2016   Hidradenitis suppurativa 10/13/2015   Peripheral polyneuropathy 07/07/2015   Coronary atherosclerosis of native coronary artery 08/17/2013   Diabetic retinopathy ([place]) 02/02/2013   Diabetic neuropathy ([place]) 12/01/2012   Essential hypertension 08/03/2012   Tobacco use disorder 08/03/2012   Erectile dysfunction 08/03/2012   Diabetes mellitus with neurological manifestations, uncontrolled 05/26/2006    Orientation RESPIRATION BLADDER Height & Weight     Self, Time, Situation, Place  Normal Continent, External catheter Weight: 195 lb (88.5 kg) Height:  '5\' 4"'$  (162.6 cm)  BEHAVIORAL SYMPTOMS/MOOD NEUROLOGICAL BOWEL NUTRITION STATUS      Continent Diet (See DC summary)  AMBULATORY STATUS COMMUNICATION OF NEEDS Skin   Extensive Assist Verbally Surgical wounds (L foot and leg incisions, L foot diabetic ulcer)  Personal Care Assistance Level of Assistance  Bathing, Feeding, Dressing Bathing Assistance: Maximum assistance Feeding assistance: Independent Dressing Assistance: Maximum assistance     Functional Limitations Info  Sight, Hearing,  Speech Sight Info: Impaired Hearing Info: Impaired Speech Info: Adequate    SPECIAL CARE FACTORS FREQUENCY  PT (By licensed PT), OT (By licensed OT)     PT Frequency: 5x week OT Frequency: 5x week            Contractures Contractures Info: Not present    Additional Factors Info  Code Status, Allergies, Insulin Sliding Scale Code Status Info: Full Allergies Info: Bee Venom   Penicillins   Clonidine Derivatives   Insulin Sliding Scale Info: Insulin Aspart (Novolog) 0-15 U every 4 hours       Current Medications (09/04/2021):  This is the current hospital active medication list Current Facility-Administered Medications  Medication Dose Route Frequency Provider Last Rate Last Admin   0.9 %  sodium chloride infusion   Intravenous Continuous Lidia Collum, MD 10 mL/hr at 09/04/21 1238 New Bag at 09/04/21 1238   [MAR Hold] acetaminophen (TYLENOL) tablet 650 mg  650 mg Oral Q6H PRN Reubin Milan, MD   650 mg at 09/01/21 0041   Or   [MAR Hold] acetaminophen (TYLENOL) suppository 650 mg  650 mg Rectal Q6H PRN Reubin Milan, MD       [facility] Hold] alum & mag hydroxide-simeth (MAALOX/MYLANTA) 200-200-20 MG/5ML suspension 15-30 mL  15-30 mL Oral Q2H PRN [name], MD       [MAR Hold] amLODipine (NORVASC) tablet 10 mg  10 mg Oral Daily Reubin Milan, MD   10 mg at 09/04/21 1030   [MAR Hold] ascorbic acid (VITAMIN C) tablet 1,000 mg  1,000 mg Oral Daily [name], MD   1,000 mg at 09/04/21 1029   [MAR Hold] aspirin EC tablet 81 mg  81 mg Oral Daily Reubin Milan, MD   81 mg at 09/04/21 1029   [START ON 09/05/2021] atorvastatin (LIPITOR) tablet 20 mg  20 mg Oral Daily Eugenie Filler, MD       [MAR Hold] bisacodyl (DULCOLAX) EC tablet 5 mg  5 mg Oral Daily PRN [name], MD       [facility] Hold] carvedilol (COREG) tablet 25 mg  25 mg Oral BID WC Reubin Milan, MD   25 mg at 09/04/21 1029   [MAR Hold] ceFAZolin (ANCEF) IVPB 2g/100 mL premix  2 g  Intravenous Q12H Carlyle Basques, MD 200 mL/hr at 09/04/21 1032 2 g at 09/04/21 1032   [START ON 09/05/2021] ceFAZolin (ANCEF) IVPB 2g/100 mL premix  2 g Intravenous On Call to OR [name], MD       chlorhexidine (HIBICLENS) 4 % liquid 4 application   60 mL Topical Once [name], MD       [facility] Hold] DAPTOmycin (CUBICIN) 550 mg in sodium chloride 0.9 % IVPB  8 mg/kg (Adjusted) Intravenous Q48H Carlyle Basques, MD 122 mL/hr at 09/03/21 2229 550 mg at 09/03/21 2229   [MAR Hold] docusate sodium (COLACE) capsule 100 mg  100 mg Oral Daily [name], MD   100 mg at 09/03/21 1136   [MAR Hold] feeding supplement (ENSURE ENLIVE / ENSURE PLUS) liquid 237 mL  237 mL Oral BID BM Danford, Suann Larry, MD   237 mL at 09/03/21 1309   [MAR Hold] ferrous sulfate tablet 325 mg  325 mg Oral Q breakfast Olevia Bowens,  Gerri Lins, MD   325 mg at 09/04/21 1030   [MAR Hold] gabapentin (NEURONTIN) capsule 300 mg  300 mg Oral QHS Donato Heinz, MD   300 mg at 09/03/21 2230   [MAR Hold] guaiFENesin-dextromethorphan (ROBITUSSIN DM) 100-10 MG/5ML syrup 15 mL  15 mL Oral Q4H PRN [name], MD       heparin lock flush 100 UNIT/ML injection            [MAR Hold] hydrALAZINE (APRESOLINE) injection 5 mg  5 mg Intravenous Q20 Min PRN [name], MD       [facility] Hold] HYDROmorphone (DILAUDID) injection 0.5-1 mg  0.5-1 mg Intravenous Q4H PRN [name], MD   1 mg at 09/04/21 0513   insulin aspart (novoLOG) injection 0-14 Units  0-14 Units Subcutaneous Q2H PRN Lidia Collum, MD   2 Units at 09/04/21 1318   [MAR Hold] insulin aspart (novoLOG) injection 0-15 Units  0-15 Units Subcutaneous Q4H Eugenie Filler, MD   2 Units at 09/04/21 0202   [MAR Hold] labetalol (NORMODYNE) injection 10 mg  10 mg Intravenous Q10 min PRN [name], MD       lidocaine-EPINEPHrine (XYLOCAINE W/EPI) 1 %-1:100000 (with pres) injection            lidocaine-EPINEPHrine (XYLOCAINE W/EPI) 1 %-1:100000 (with pres) injection     PRN Sandi Mariscal, MD   10 mL at 09/04/21 1126   [MAR Hold] magnesium sulfate IVPB 2 g 50 mL  2 g Intravenous Daily PRN [name], MD       [facility] Hold] metoprolol tartrate (LOPRESSOR) injection 2-5 mg  2-5 mg Intravenous Q2H PRN [name], MD       [MAR Hold] multivitamin with minerals tablet 1 tablet  1 tablet Oral Daily Danford, Suann Larry, MD   1 tablet at 09/04/21 1029   [MAR Hold] mupirocin ointment (BACTROBAN) 2 % 1 application.  1 application  Nasal BID Edwin Dada, MD   1 application  at 45/40/98 2258   [facility] Hold] nutrition supplement (JUVEN) (JUVEN) powder packet 1 packet  1 packet Oral BID BM Danford, Suann Larry, MD   1 packet at 09/04/21 1031   [MAR Hold] ondansetron (ZOFRAN) injection 4 mg  4 mg Intravenous Q6H PRN [name], MD       [MAR Hold] ondansetron [facility]) tablet 4 mg  4 mg Oral Q6H PRN Reubin Milan, MD       [facility] Hold] oxyCODONE (Oxy IR/ROXICODONE) immediate release tablet 10-15 mg  10-15 mg Oral Q4H PRN [name], MD       [facility] Hold] oxyCODONE (Oxy IR/ROXICODONE) immediate release tablet 5-10 mg  5-10 mg Oral Q4H PRN [name], MD   10 mg at 09/03/21 1321   [MAR Hold] pantoprazole (PROTONIX) EC tablet 40 mg  40 mg Oral Daily [name], MD   40 mg at 09/04/21 1029   [MAR Hold] phenol (CHLORASEPTIC) mouth spray 1 spray  1 spray Mouth/Throat PRN [name], MD       [MAR Hold] polyethylene glycol (MIRALAX / GLYCOLAX) packet 17 g  17 g Oral Daily PRN [name], MD       [facility] Hold] potassium chloride SA (KLOR-CON M) CR tablet 20-40 mEq  20-40 mEq Oral Daily PRN [name], MD       [MAR Hold] senna [facility]) tablet 8.6 mg  1 tablet Oral Daily PRN Reubin Milan,  MD       sodium bicarbonate 75 mEq in sodium chloride 0.45 % 1,075 mL infusion   Intravenous Continuous Starla Link, Kshitiz, MD 100 mL/hr at 09/04/21 0240 New Bag at 09/04/21 0240   tranexamic acid (CYKLOKAPRON) '1000MG'$ /157m IVPB            tranexamic acid  (CYKLOKAPRON) IVPB 1,000 mg  1,000 mg Intravenous To OR DNewt Minion MD       [facility] 220 mg  220 mg Oral Daily DNewt Minion MD   220 mg at 09/04/21 1029     Discharge Medications: Please see discharge summary for a list of discharge medications.  Relevant Imaging Results:  Relevant Lab Results:   Additional Information SS# [id number]

## 2021-09-05 ENCOUNTER — Encounter (HOSPITAL_COMMUNITY): Payer: Self-pay | Admitting: Orthopedic Surgery

## 2021-09-05 DIAGNOSIS — M86179 Other acute osteomyelitis, unspecified ankle and foot: Secondary | ICD-10-CM | POA: Diagnosis not present

## 2021-09-05 DIAGNOSIS — M869 Osteomyelitis, unspecified: Secondary | ICD-10-CM | POA: Diagnosis not present

## 2021-09-05 DIAGNOSIS — E11621 Type 2 diabetes mellitus with foot ulcer: Secondary | ICD-10-CM | POA: Diagnosis not present

## 2021-09-05 DIAGNOSIS — N179 Acute kidney failure, unspecified: Secondary | ICD-10-CM | POA: Diagnosis not present

## 2021-09-05 DIAGNOSIS — N184 Chronic kidney disease, stage 4 (severe): Secondary | ICD-10-CM | POA: Diagnosis not present

## 2021-09-05 LAB — CBC WITH DIFFERENTIAL/PLATELET
Abs Immature Granulocytes: 0.8 10*3/uL — ABNORMAL HIGH (ref 0.00–0.07)
Basophils Absolute: 0 10*3/uL (ref 0.0–0.1)
Basophils Relative: 0 %
Eosinophils Absolute: 0 10*3/uL (ref 0.0–0.5)
Eosinophils Relative: 0 %
HCT: 26.2 % — ABNORMAL LOW (ref 39.0–52.0)
Hemoglobin: 8.5 g/dL — ABNORMAL LOW (ref 13.0–17.0)
Lymphocytes Relative: 2 %
Lymphs Abs: 0.6 10*3/uL — ABNORMAL LOW (ref 0.7–4.0)
MCH: 27 pg (ref 26.0–34.0)
MCHC: 32.4 g/dL (ref 30.0–36.0)
MCV: 83.2 fL (ref 80.0–100.0)
Monocytes Absolute: 0.8 10*3/uL (ref 0.1–1.0)
Monocytes Relative: 3 %
Myelocytes: 3 %
Neutro Abs: 25.5 10*3/uL — ABNORMAL HIGH (ref 1.7–7.7)
Neutrophils Relative %: 92 %
Platelets: 434 10*3/uL — ABNORMAL HIGH (ref 150–400)
RBC: 3.15 MIL/uL — ABNORMAL LOW (ref 4.22–5.81)
RDW: 16.3 % — ABNORMAL HIGH (ref 11.5–15.5)
WBC: 27.7 10*3/uL — ABNORMAL HIGH (ref 4.0–10.5)
nRBC: 0 /100 WBC
nRBC: 0.1 % (ref 0.0–0.2)

## 2021-09-05 LAB — RENAL FUNCTION PANEL
Albumin: <1.5 g/dL — ABNORMAL LOW (ref 3.5–5.0)
Anion gap: 5 (ref 5–15)
BUN: 85 mg/dL — ABNORMAL HIGH (ref 6–20)
CO2: 22 mmol/L (ref 22–32)
Calcium: 7.2 mg/dL — ABNORMAL LOW (ref 8.9–10.3)
Chloride: 111 mmol/L (ref 98–111)
Creatinine, Ser: 3.01 mg/dL — ABNORMAL HIGH (ref 0.61–1.24)
GFR, Estimated: 24 mL/min — ABNORMAL LOW (ref >60)
Glucose, Bld: 167 mg/dL — ABNORMAL HIGH (ref 70–99)
Phosphorus: 3.9 mg/dL (ref 2.5–4.6)
Potassium: 5 mmol/L (ref 3.5–5.1)
Sodium: 138 mmol/L (ref 135–145)

## 2021-09-05 LAB — GLUCOSE, CAPILLARY
Glucose-Capillary: 134 mg/dL — ABNORMAL HIGH (ref 70–99)
Glucose-Capillary: 143 mg/dL — ABNORMAL HIGH (ref 70–99)
Glucose-Capillary: 151 mg/dL — ABNORMAL HIGH (ref 70–99)
Glucose-Capillary: 165 mg/dL — ABNORMAL HIGH (ref 70–99)
Glucose-Capillary: 177 mg/dL — ABNORMAL HIGH (ref 70–99)
Glucose-Capillary: 195 mg/dL — ABNORMAL HIGH (ref 70–99)

## 2021-09-05 MED ORDER — ALBUMIN HUMAN 25 % IV SOLN
25.0000 g | Freq: Four times a day (QID) | INTRAVENOUS | Status: AC
  Administered 2021-09-05 – 2021-09-07 (×7): 25 g via INTRAVENOUS
  Filled 2021-09-05 (×8): qty 100

## 2021-09-05 MED ORDER — INSULIN GLARGINE-YFGN 100 UNIT/ML ~~LOC~~ SOLN
10.0000 [IU] | Freq: Every day | SUBCUTANEOUS | Status: DC
  Administered 2021-09-05 – 2021-09-15 (×11): 10 [IU] via SUBCUTANEOUS
  Filled 2021-09-05 (×13): qty 0.1

## 2021-09-05 MED ORDER — INSULIN ASPART 100 UNIT/ML IJ SOLN
0.0000 [IU] | Freq: Three times a day (TID) | INTRAMUSCULAR | Status: DC
  Administered 2021-09-05 – 2021-09-06 (×2): 3 [IU] via SUBCUTANEOUS
  Administered 2021-09-06: 2 [IU] via SUBCUTANEOUS
  Administered 2021-09-06: 5 [IU] via SUBCUTANEOUS
  Administered 2021-09-07: 2 [IU] via SUBCUTANEOUS
  Administered 2021-09-07 – 2021-09-08 (×3): 3 [IU] via SUBCUTANEOUS
  Administered 2021-09-08: 2 [IU] via SUBCUTANEOUS
  Administered 2021-09-08: 3 [IU] via SUBCUTANEOUS
  Administered 2021-09-09: 2 [IU] via SUBCUTANEOUS
  Administered 2021-09-09: 8 [IU] via SUBCUTANEOUS
  Administered 2021-09-09 – 2021-09-10 (×2): 2 [IU] via SUBCUTANEOUS
  Administered 2021-09-10: 3 [IU] via SUBCUTANEOUS
  Administered 2021-09-12: 2 [IU] via SUBCUTANEOUS
  Administered 2021-09-12: 3 [IU] via SUBCUTANEOUS
  Administered 2021-09-13: 2 [IU] via SUBCUTANEOUS
  Administered 2021-09-14: 3 [IU] via SUBCUTANEOUS

## 2021-09-05 NOTE — Progress Notes (Signed)
Patient ID: TUAN TIPPIN, male   DOB: [birth year], 51 y.o.   MRN: [id number] S: Feeling better today. O:BP (!) 151/84 (BP Location: Right Arm)   Pulse 81   Temp 98.4 F (36.9 C) (Oral)   Resp 18   Ht $R'5\' 4"'bo$  (1.626 m)   Wt 88.5 kg   SpO2 100%   BMI 33.47 kg/m   Intake/Output Summary (Last 24 hours) at 09/05/2021 0933 Last data filed at 09/05/2021 0423 Gross per 24 hour  Intake 1748.67 ml  Output 1650 ml  Net 98.67 ml   Intake/Output: I/O last 3 completed shifts: In: 2878.2 [P.O.:340; I.V.:2377.2; IV Piggyback:161] Out: 2725 [Urine:1550; Blood:100]  Intake/Output this shift:  No intake/output data recorded. Weight change:  Gen:NAD CVS: RRR Resp: CTA Abd: +BS, soft, NT/ND Ext: no edema of RLE, s/p left BKA, s/p right TMA  Recent Labs  Lab 08/29/21 1213 08/30/21 0520 08/31/21 0449 09/01/21 0138 09/02/21 0212 09/03/21 0149 09/04/21 0755 09/05/21 0142  NA 134* 139 138 136 139 136 142 138  K 4.6 4.1 4.9 4.5 3.8 4.9 4.1 5.0  CL 107 113* 114* 113* 107 110 108 111  CO2 13* 15* 14* 16* 18* 18* 22 22  GLUCOSE 250* 149* 233* 204* 28* 231* 112* 167*  BUN 109* 103* 108* 113* 113* 112* 97* 85*  CREATININE 5.21* 4.23* 3.84* 4.07* 3.78* 3.69* 3.36* 3.01*  ALBUMIN 2.4* 2.0* 1.8* 1.6* 1.5* <1.5* <1.5*  <1.5* <1.5*  CALCIUM 9.1 8.4* 8.3* 8.0* 7.9* 7.4* 7.9* 7.2*  PHOS  --   --   --   --   --   --  3.8 3.9  AST 31 22 47* 32 28 42* 23  --   ALT 46* 30 43 41 31 36 19  --    Liver Function Tests: Recent Labs  Lab 09/02/21 0212 09/03/21 0149 09/04/21 0755 09/05/21 0142  AST 28 42* 23  --   ALT 31 36 19  --   ALKPHOS 124 101 97  --   BILITOT 1.2 1.0 0.7  --   PROT 5.9* 5.7* 5.8*  --   ALBUMIN 1.5* <1.5* <1.5*  <1.5* <1.5*   No results for input(s): "LIPASE", "AMYLASE" in the last 168 hours. No results for input(s): "AMMONIA" in the last 168 hours. CBC: Recent Labs  Lab 08/29/21 1200 08/30/21 0520 09/01/21 0138 09/02/21 0212 09/03/21 0149 09/04/21 0755  09/05/21 0142  WBC 42.6*   < > 50.5* 45.5* 46.8* 39.1* 27.7*  NEUTROABS 35.7*  --   --   --   --   --  25.5*  HGB 11.2*   < > 9.8* 9.6* 10.1* 9.0* 8.5*  HCT 33.4*   < > 27.5* 26.9* 28.7* 27.1* 26.2*  MCV 83.3   < > 77.5* 77.1* 78.6* 80.7 83.2  PLT 461*   < > 494* 489* 463* 458* 434*   < > = values in this interval not displayed.   Cardiac Enzymes: No results for input(s): "CKTOTAL", "CKMB", "CKMBINDEX", "TROPONINI" in the last 168 hours. CBG: Recent Labs  Lab 09/04/21 1702 09/04/21 2002 09/05/21 0015 09/05/21 0420 09/05/21 0911  GLUCAP 143* 163* 151* 143* 134*    Iron Studies: No results for input(s): "IRON", "TIBC", "TRANSFERRIN", "FERRITIN" in the last 72 hours. Studies/Results: IR Fluoro Guide CV Line Right  Result Date: 09/04/2021 INDICATION: Poor venous access. In need of durable intravenous antibiotic administration. History of chronic renal insufficiency. As such, request made for placement of a tunneled central catheter. EXAM: ULTRASOUND  AND FLUOROSCOPIC GUIDED PLACEMENT OF TUNNELED CENTRAL VENOUS CATHETER MEDICATIONS: None. The patient is currently admitted to the hospital receiving intravenous antibiotics. The antibiotic was given in an appropriate time interval prior to skin puncture. ANESTHESIA/SEDATION: None FLUOROSCOPY TIME:  18 seconds (0 mGy) COMPLICATIONS: None immediate. PROCEDURE: Informed written consent was obtained from the patient after a discussion of the risks, benefits, and alternatives to treatment. Questions regarding the procedure were encouraged and answered. The right neck and chest were prepped with chlorhexidine in a sterile fashion, and a sterile drape was applied covering the operative field. Maximum barrier sterile technique with sterile gowns and gloves were used for the procedure. A timeout was performed prior to the initiation of the procedure. After the overlying soft tissues were anesthetized, a small venotomy incision was created and a micropuncture  kit was utilized to access the internal jugular vein. Real-time ultrasound guidance was utilized for vascular access including the acquisition of a permanent ultrasound image documenting patency of the accessed vessel. The microwire was utilized to measure appropriate catheter length. The micropuncture sheath was exchanged for a peel-away sheath over a guidewire. A 5 [place] dual lumen tunneled central venous catheter measuring 24 cm was tunneled in a retrograde fashion from the anterior chest wall to the venotomy incision. The catheter was then placed through the peel-away sheath with tip ultimately positioned at the superior caval-atrial junction. Final catheter positioning was confirmed and documented with a spot radiographic image. The catheter aspirates and flushes normally. The catheter was flushed with appropriate volume heparin dwells. The catheter exit site was secured with a 0-Prolene retention suture. The venotomy incision was closed with Dermabond and Steri-strips. Dressings were applied. The patient tolerated the procedure well without immediate post procedural complication. FINDINGS: After catheter placement, the tip lies within the superior cavoatrial junction. The catheter aspirates and flushes normally and is ready for immediate use. IMPRESSION: Successful placement of 24 cm dual lumen tunneled central venous catheter via the right internal jugular vein with tip terminating at the superior caval atrial junction. The catheter is ready for immediate use. Electronically Signed   By: Sandi Mariscal M.D.   On: 09/04/2021 14:45   IR US Guide Vasc Access Right  Result Date: 09/04/2021 INDICATION: Poor venous access. In need of durable intravenous antibiotic administration. History of chronic renal insufficiency. As such, request made for placement of a tunneled central catheter. EXAM: ULTRASOUND AND FLUOROSCOPIC GUIDED PLACEMENT OF TUNNELED CENTRAL VENOUS CATHETER MEDICATIONS: None. The patient is currently  admitted to the hospital receiving intravenous antibiotics. The antibiotic was given in an appropriate time interval prior to skin puncture. ANESTHESIA/SEDATION: None FLUOROSCOPY TIME:  18 seconds (0 mGy) COMPLICATIONS: None immediate. PROCEDURE: Informed written consent was obtained from the patient after a discussion of the risks, benefits, and alternatives to treatment. Questions regarding the procedure were encouraged and answered. The right neck and chest were prepped with chlorhexidine in a sterile fashion, and a sterile drape was applied covering the operative field. Maximum barrier sterile technique with sterile gowns and gloves were used for the procedure. A timeout was performed prior to the initiation of the procedure. After the overlying soft tissues were anesthetized, a small venotomy incision was created and a micropuncture kit was utilized to access the internal jugular vein. Real-time ultrasound guidance was utilized for vascular access including the acquisition of a permanent ultrasound image documenting patency of the accessed vessel. The microwire was utilized to measure appropriate catheter length. The micropuncture sheath was exchanged for a peel-away sheath  over a guidewire. A 5 [place] dual lumen tunneled central venous catheter measuring 24 cm was tunneled in a retrograde fashion from the anterior chest wall to the venotomy incision. The catheter was then placed through the peel-away sheath with tip ultimately positioned at the superior caval-atrial junction. Final catheter positioning was confirmed and documented with a spot radiographic image. The catheter aspirates and flushes normally. The catheter was flushed with appropriate volume heparin dwells. The catheter exit site was secured with a 0-Prolene retention suture. The venotomy incision was closed with Dermabond and Steri-strips. Dressings were applied. The patient tolerated the procedure well without immediate post procedural  complication. FINDINGS: After catheter placement, the tip lies within the superior cavoatrial junction. The catheter aspirates and flushes normally and is ready for immediate use. IMPRESSION: Successful placement of 24 cm dual lumen tunneled central venous catheter via the right internal jugular vein with tip terminating at the superior caval atrial junction. The catheter is ready for immediate use. Electronically Signed   By: Simonne Come M.D.   On: 09/04/2021 14:45    amLODipine  10 mg Oral Daily   vitamin C  1,000 mg Oral Daily   aspirin EC  81 mg Oral Daily   atorvastatin  20 mg Oral Daily   carvedilol  25 mg Oral BID WC   docusate sodium  100 mg Oral Daily   feeding supplement  237 mL Oral BID BM   ferrous sulfate  325 mg Oral Q breakfast   gabapentin  300 mg Oral QHS   insulin aspart  0-15 Units Subcutaneous Q4H   multivitamin with minerals  1 tablet Oral Daily   mupirocin ointment  1 application  Nasal BID   nutrition supplement (JUVEN)  1 packet Oral BID BM   pantoprazole  40 mg Oral Daily   zinc sulfate  220 mg Oral Daily    BMET    Component Value Date/Time   NA 138 09/05/2021 0142   NA 138 05/02/2020 0933   K 5.0 09/05/2021 0142   CL 111 09/05/2021 0142   CO2 22 09/05/2021 0142   GLUCOSE 167 (H) 09/05/2021 0142   BUN 85 (H) 09/05/2021 0142   BUN 22 05/02/2020 0933   CREATININE 3.01 (H) 09/05/2021 0142   CREATININE 2.79 (H) 01/13/2021 0939   CREATININE 3.14 (H) 10/17/2020 1511   CALCIUM 7.2 (L) 09/05/2021 0142   GFRNONAA 24 (L) 09/05/2021 0142   GFRNONAA 27 (L) 01/13/2021 0939   GFRNONAA 29 (L) 09/04/2020 1545   GFRAA 33 (L) 09/04/2020 1545   CBC    Component Value Date/Time   WBC 27.7 (H) 09/05/2021 0142   RBC 3.15 (L) 09/05/2021 0142   HGB 8.5 (L) 09/05/2021 0142   HGB 10.3 (L) 01/13/2021 0939   HGB 10.8 (L) 06/24/2020 1055   HCT 26.2 (L) 09/05/2021 0142   HCT 33.5 (L) 06/24/2020 1055   PLT 434 (H) 09/05/2021 0142   PLT 310 01/13/2021 0939   PLT 437  06/24/2020 1055   MCV 83.2 09/05/2021 0142   MCV 84 06/24/2020 1055   MCH 27.0 09/05/2021 0142   MCHC 32.4 09/05/2021 0142   RDW 16.3 (H) 09/05/2021 0142   RDW 14.1 06/24/2020 1055   LYMPHSABS 0.6 (L) 09/05/2021 0142   LYMPHSABS 3.3 (H) 06/24/2020 1055   MONOABS 0.8 09/05/2021 0142   EOSABS 0.0 09/05/2021 0142   EOSABS 0.3 06/24/2020 1055   BASOSABS 0.0 09/05/2021 0142   BASOSABS 0.1 06/24/2020 1055    Assessment/Plan:  AKI/CKD stage IV, non-oliguric - BUN/Cr slowly improving.  He is on Vanc and trough level was a little high at 20, so may be related to early vanc toxicity.  Last dose given 09/02/21.  He is asymptomatic.  BP's were soft when he first came in so ATN possibility.  He also admitted taking ibuprofen at home prior to admission for foot pain.  BUN/Cr continue to improve daily.  No indication for dialysis.  Nothing further to add and will sign off.  Please call with questions or concerns.  Pt to f/u with Dr. [name] at our office after discharge home. Renal dose meds Avoid nephrotoxic agents such as IV contrast, NSAIDs, phosphate containing bowel preps. Will decrease gabapentin to 300 mg qhs given eGFR of 17 ml/min. Preferred narcotic agents for pain control are hydromorphone, fentanyl, and  methadone. Morphine should not be used. Avoid Baclofen  Osteomyelitis of left foot - on vanco, cefepime, and flagyl.  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  ID consulted and recommended to change to daptomycin pluse cefazolin for 4 weeks.  No PICC but ok for tunneled central line for prolonged Abx.  Stopping vancomycin.  Severe protein-calorie malnutrition.  ABLA/Anemia of CKD stage IV and iron deficiency - will check iron stores.  Transfuse for hgb <7.  Will likely need to start ESA.  Drop in Hgb following surgery CAD - cont asa, BB.  Statin on hold due to elevated LFT's HTN - stable Non-anion gap metabolic acidosis - due to CKD.  Currently on bicarb, continue and follow.  Severe protein malnutrition - albumin <1.5.   supplement with Nepro.  Donetta Potts, MD [facility]

## 2021-09-05 NOTE — TOC Initial Note (Addendum)
Transition of Care [facility]) - Initial/Assessment Note    Patient Details  Name: [name] MRN: [id number] Date of Birth: [birth year]  Transition of Care [facility]) CM/SW Contact:    Elliot Gurney [place], [place] Phone Number: 09/05/2021, 3:03 PM  Clinical Narrative:                  This Education officer, museum met with patient, his 2 sisters and significant other at bedside to discuss recommendation for SNF. Patient's family agreeable to SNF recommendation. Patient has limited care at home. SNF process discussed, FL2 faxed out for potential bed offers. Family does not want to consider [facility]  Will follow up with patient and his family on 09/06/21.  Dick Hark, LCSW Transition of Care  Expected Discharge Plan: Skilled Nursing Facility Barriers to Discharge: Continued Medical Work up   Patient Goals and CMS Choice Patient states their goals for this hospitalization and ongoing recovery are:: "I want to go home" CMS Medicare.gov Compare Post Acute Care list provided to:: Patient Choice offered to / list presented to : Sibling, Patient  Expected Discharge Plan and Services Expected Discharge Plan: Skilled Nursing Facility In-house Referral: Clinical Social Work   Post Acute Care Choice: [place]                                        Prior Living Arrangements/Services   Lives with:: Self   Do you feel safe going back to the place where you live?: Yes      Need for Family Participation in Patient Care: Yes (Comment) Care giver support system in place?: Yes (comment)   Criminal Activity/Legal Involvement Pertinent to Current Situation/Hospitalization: No - Comment as needed  Activities of Daily Living Home Assistive Devices/Equipment: Cane (specify quad or straight), Wheelchair, Environmental consultant (specify type) ADL Screening (condition at time of admission) Patient's cognitive ability adequate to safely complete daily activities?: Yes Is the patient deaf  or have difficulty hearing?: No Does the patient have difficulty seeing, even when wearing glasses/contacts?: No Does the patient have difficulty concentrating, remembering, or making decisions?: No Patient able to express need for assistance with ADLs?: Yes Does the patient have difficulty dressing or bathing?: Yes Independently performs ADLs?: No Communication: Dependent Is this a change from baseline?: Change from baseline, expected to last >3 days Dressing (OT): Dependent Is this a change from baseline?: Change from baseline, expected to last >3 days Grooming: Dependent Is this a change from baseline?: Change from baseline, expected to last >3 days Feeding: Independent Bathing: Dependent Is this a change from baseline?: Change from baseline, expected to last >3 days Toileting: Dependent Is this a change from baseline?: Change from baseline, expected to last >3days In/Out Bed: Dependent Is this a change from baseline?: Change from baseline, expected to last >3 days Walks in Home: Dependent Is this a change from baseline?: Change from baseline, expected to last >3 days Does the patient have difficulty walking or climbing stairs?: Yes Weakness of Legs: Both Weakness of Arms/Hands: None  Permission Sought/Granted                  Emotional Assessment Appearance:: Appears older than stated age Attitude/Demeanor/Rapport: Avoidant Affect (typically observed): Appropriate Orientation: : Oriented to Self, Oriented to Place, Oriented to  Time, Oriented to Situation Alcohol / Substance Use: Not Applicable Psych Involvement: No (comment)  Admission diagnosis:  Diabetic ulcer  of left foot associated with type 2 diabetes mellitus ([place]) [W88.891, L97.529] Osteomyelitis of fifth toe of left foot ([employer]) [M86.9] Patient Active Problem List   Diagnosis Date Noted   Dehiscence of amputation stump of left lower extremity ([place])    CKD (chronic kidney disease), stage IV ([place]) 09/01/2021    Diabetic ulcer of left foot associated with type 2 diabetes mellitus ([place]) 08/29/2021   Severe protein-calorie malnutrition ([place]) 08/29/2021   Transaminitis 08/29/2021   Endocarditis of mitral valve    Severe sepsis ([place]) 04/21/2021   MSSA bacteremia    Type 2 diabetes mellitus with hyperglycemia ([place]) 04/17/2021   Acute kidney injury superimposed on CKD ([place]) 04/17/2021   Mixed hyperlipidemia 04/17/2021   Eustachian tube dysfunction, bilateral 03/18/2021   Ulcer of right foot with necrosis of bone ([place]) 07/22/2020   Anemia in chronic kidney disease (CKD) 07/05/2020   Wound infection 03/25/2020   Gas gangrene of foot ([place])    Type 2 diabetes mellitus with diabetic polyneuropathy, with long-term current use of insulin ([place]) 07/16/2019   Type 2 diabetes mellitus with stage 3a chronic kidney disease, with long-term current use of insulin ([place]) 07/16/2019   Diabetic foot ulcer ([place]) 06/29/2019   Stage 3b chronic kidney disease (CKD) ([place]) 01/24/2019   Obesity 01/24/2019   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[place]) 11/13/2018   Diabetic foot ([place]) 10/17/2018   Non-pressure chronic ulcer of other part of right foot limited to breakdown of skin ([place]) 09/11/2018   Type 2 diabetes mellitus with proliferative retinopathy of both eyes, without long-term current use of insulin ([place]) 07/14/2018   Diabetic infection of left foot ([place]) 07/14/2018   Wound cellulitis 05/22/2018   Osteomyelitis of foot, acute ([place]) 05/21/2018   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 ([place])    Diabetic ulcer of left midfoot associated with diabetes mellitus due to underlying condition, with fat layer exposed ([place]) 08/26/2017   B12 deficiency 06/15/2017   Vitamin D deficiency 06/03/2017   Iron deficiency anemia 06/01/2017   Diabetic foot infection ([place])    Vitreous floaters of right eye 09/15/2016   Non compliance w medication regimen 09/15/2016   Depression 09/01/2016   Foot ulcer due to secondary DM ([place])  08/20/2016   Hidradenitis suppurativa 10/13/2015   Peripheral polyneuropathy 07/07/2015   Coronary atherosclerosis of native coronary artery 08/17/2013   Diabetic retinopathy ([place]) 02/02/2013   Diabetic neuropathy ([place]) 12/01/2012   Essential hypertension 08/03/2012   Tobacco use disorder 08/03/2012   Erectile dysfunction 08/03/2012   Diabetes mellitus with neurological manifestations, uncontrolled 05/26/2006   PCP:  Camillia Herter, NP Pharmacy:   [facility] ([place]), [place] - [address] [place]) [place] [address] Phone: [phone] Fax: [phone]  [facility] ([facility] ) - [place], [place] - [address] [place] [address] Phone: [phone] Fax: [phone]     Social Determinants of Health (SDOH) Interventions    Readmission Risk Interventions     No data to display

## 2021-09-05 NOTE — Progress Notes (Signed)
PROGRESS NOTE    [name]  VZS:[id number] DOB: [birth year] DOA: 08/29/2021 PCP: Camillia Herter, NP    Chief Complaint  Patient presents with   Fall    Brief Narrative:  [name] is a 51 y.o. 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  He was evaluated by surgery who recommended transtibial amputation.   6/3: Admitted, Cr 5.2 on admission (from baseline 2.5); Podiatry evaluated, MRI showed osteo, no salvage operations were judged possible 6/5: Transferred to [facility], ABIs done, inconclusive    Assessment & Plan:  Principal Problem:   Diabetic ulcer of left foot associated with type 2 diabetes mellitus ([place]) Active Problems:   Osteomyelitis of foot, acute ([place])   Acute kidney injury superimposed on CKD ([place])   Essential hypertension   Coronary atherosclerosis of native coronary artery   Anemia in chronic kidney disease (CKD)   Type 2 diabetes mellitus with hyperglycemia ([place])   Mixed hyperlipidemia   Severe protein-calorie malnutrition ([name])   Transaminitis   CKD (chronic kidney disease), stage IV ([name])   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  -Op note with findings of purulent abscess that extended up to the popliteal fossa with necrotic muscle along the medial head of the gastrocnemius muscle and medial soleus.   -ID consulted and recommended discontinuation of IV vancomycin and IV cefepime and placing patient on daptomycin plus cefazolin.  -ID recommending at least 4 weeks of IV antibiotics and recommending to check a sed rate and CRP. -Due to renal function unable to place a PICC at  this time, nephrology recommending tunneled central line for prolonged antibiotics which was placed 09/04/2021 per IR. -Patient taken back to the OR per orthopedics, Dr. Sharol Given on 09/04/2021 and patient noted to have purulence draining from below the knee amputation and as such orthopedics proceeded with left AKA. -Per orthopedics clear margins noted. -Patient may not need long-term IV antibiotics however will defer to ID. -ID to assess patient today to determine recommendations for antibiotics. - Continue nutritional supplements, vitamins  Acute kidney injury superimposed on CKD ([place]) Renal function 5.2 on admission, up from basleine around 2.5-2.8 since Jan. Here, was initially improving, but in last day, despite bicarb fluids, trending back up >4 mg/dL, in setting of vancomycin use. -Patient also noted to be taking NSAIDs prior to admission. - Patient seen in consultation by nephrology and patient placed on a bicarb drip.  -Renal function slowly improved and seems to be stabilizing currently at 3.01 today.   -Discontinue bicarb drip. -Nephrology was following but have signed off today 09/05/2021 with recommendations for outpatient follow-up with Dr. [name] after discharge. -Avoid nephrotoxic agents.    CKD (chronic kidney disease), stage IV ([place]) Cr seems to have worsened since about Jan when he had endocarditis. Now 2.5-2.8 baseline. - Nephrology following.  Transaminitis -Total bilirubin has improved currently down to 0.7 -Follow.  Severe protein-calorie malnutrition ([name]) - Albumin currently at  < 1.5. -IV albumin every 6 hours x24 to 48 hours  -continue nutritional supplementation with Ensure.  Mixed hyperlipidemia - Repeat LFTs stable.   -Statin  resumed.    Type 2 diabetes mellitus with hyperglycemia ([name]) - Hemoglobin A1c 7.5 (08/30/2021 ). -CBG 28 of basic metabolic profile the morning of 09/02/2021. -Patient noted to have received Semglee while n.p.o., which has been  discontinued. -CBGs have improved and CBG of 143 this morning. -Discontinued D5 normal saline. -Placed back on Semglee 10 units daily as patient currently postop. -SSI.  Anemia in chronic kidney disease (CKD) Hgb stable at 8.5. - Continue iron -Follow.  Iron deficiency anemia - Continue iron  Coronary atherosclerosis of native coronary artery - Continue aspirin, BB -Resume statin.  Essential hypertension - Blood pressure controlled on current regimen of amlodipine, Coreg.   -Continue to hold home regimen clonidine.           DVT prophylaxis: Per orthopedics Code Status: Full Family Communication: Updated patient, no family at bedside Disposition: TBD  Status is: Inpatient Remains inpatient appropriate because: Severity of illness   Consultants:  Orthopedics: Dr. Sharol Given 08/31/2021 Podiatry: Dr. Jacqualyn Posey 08/29/2021 Nephrology: Dr. Marval Regal 09/01/2021 Infectious disease: Dr. Baxter Flattery 09/03/2021  Procedures:  Plain films of the left foot 09/25/2021 MRI left foot 08/29/2021 Renal ultrasound 09/01/2021 ABI bilateral 08/31/2021 Left BKA per orthopedics: Dr. Sharol Given 09/02/2021 Irrigation debridement left foot, bone biopsy.  Podiatry: Dr. Jacqualyn Posey 08/30/2021 Right jugular tunneled dual-lumen CVC line placement per IR, Dr. Pascal Lux 03/30/4578 Left AKA application of wound VAC per Dr. Sharol Given 09/04/2021   Antimicrobials:  Oral Flagyl 08/29/2021>>>> 09/05/2021 IV cefepime 08/29/2021>>>> 09/03/2021 IV vancomycin 08/29/2021>>> 09/03/2021 IV daptomycin 09/03/2021>>>> IV Ancef 09/03/2021>>>>>    Subjective: Patient sitting up on the side of the bed getting cleaned up.  No chest pain.  No shortness of breath.  No abdominal pain.  Tolerating current diet.    Objective: Vitals:   09/05/21 0800 09/05/21 1203 09/05/21 1600 09/05/21 1805  BP: (!) 151/84 (!) 152/82 135/72 (!) 161/91  Pulse: 81 77 84 85  Resp: $Remo'18 16 16 16  'lyJKA$ Temp: 98.4 F (36.9 C) 98 F (36.7 C) 98.7 F (37.1 C) 97.9 F (36.6 C)  TempSrc: Oral Oral  Oral Oral  SpO2: 100% 100% 100% 100%  Weight:      Height:        Intake/Output Summary (Last 24 hours) at 09/05/2021 1849 Last data filed at 09/05/2021 0800 Gross per 24 hour  Intake 848.67 ml  Output 1600 ml  Net -751.33 ml   Filed Weights   08/29/21 1125 09/02/21 1206 09/04/21 1204  Weight: 87.5 kg 87.5 kg 88.5 kg    Examination:  General exam: NAD. Respiratory system: CTA B.  No wheezes, no crackles, no rhonchi.  Fair air movement.  Speaking in full sentences. Cardiovascular system: RRR no murmurs rubs or gallop.  No JVD.  No lower extremity edema.  Gastrointestinal system: Abdomen is soft, nontender, nondistended, positive bowel sounds.  No rebound.  No guarding.  Central nervous system: Alert and oriented. No focal neurological deficits. Extremities: Status post left AKA with wound VAC noted.  Status post right transmetatarsal amputation.  Skin: No rashes, lesions or ulcers Psychiatry: Judgement and insight appear normal. Mood & affect appropriate.     Data Reviewed:   CBC: Recent Labs  Lab 09/01/21 0138 09/02/21 0212 09/03/21 0149 09/04/21 0755 09/05/21 0142  WBC 50.5* 45.5* 46.8* 39.1* 27.7*  NEUTROABS  --   --   --   --  25.5*  HGB 9.8* 9.6* 10.1* 9.0* 8.5*  HCT 27.5* 26.9* 28.7* 27.1* 26.2*  MCV 77.5* 77.1* 78.6* 80.7 83.2  PLT 494*  489* 463* 458* 434*    Basic Metabolic Panel: Recent Labs  Lab 08/31/21 0449 09/01/21 0138 09/02/21 0212 09/03/21 0149 09/04/21 0755 09/05/21 0142  NA 138 136 139 136 142 138  K 4.9 4.5 3.8 4.9 4.1 5.0  CL 114* 113* 107 110 108 111  CO2 14* 16* 18* 18* 22 22  GLUCOSE 233* 204* 28* 231* 112* 167*  BUN 108* 113* 113* 112* 97* 85*  CREATININE 3.84* 4.07* 3.78* 3.69* 3.36* 3.01*  CALCIUM 8.3* 8.0* 7.9* 7.4* 7.9* 7.2*  MG 2.5* 2.3  --   --   --   --   PHOS  --   --   --   --  3.8 3.9    GFR: Estimated Creatinine Clearance: 29.1 mL/min (A) (by C-G formula based on SCr of 3.01 mg/dL (H)).  Liver Function  Tests: Recent Labs  Lab 08/31/21 0449 09/01/21 0138 09/02/21 0212 09/03/21 0149 09/04/21 0755 09/05/21 0142  AST 47* 32 28 42* 23  --   ALT 43 41 31 36 19  --   ALKPHOS 107 110 124 101 97  --   BILITOT 2.0* 1.3* 1.2 1.0 0.7  --   PROT 6.5 5.9* 5.9* 5.7* 5.8*  --   ALBUMIN 1.8* 1.6* 1.5* <1.5* <1.5*  <1.5* <1.5*    CBG: Recent Labs  Lab 09/05/21 0015 09/05/21 0420 09/05/21 0911 09/05/21 1205 09/05/21 1804  GLUCAP 151* 143* 134* 165* 195*     Recent Results (from the past 240 hour(s))  Blood culture (routine x 2)     Status: None   Collection Time: 08/29/21 12:13 PM   Specimen: BLOOD  Result Value Ref Range Status   Specimen Description   Final    BLOOD RIGHT ANTECUBITAL Performed at [facility], [place] [address] [place], [place] [address]    Special Requests   Final    BOTTLES DRAWN AEROBIC AND ANAEROBIC Blood Culture results may not be optimal due to an excessive volume of blood received in culture bottles Performed at [place] [address] [place], [place] [address]    Culture   Final    NO GROWTH 5 DAYS Performed at [facility], [place] [address] [place], [place] [address]    Report Status 09/03/2021 FINAL  Final  Aerobic/Anaerobic Culture w Gram Stain (surgical/deep wound)     Status: None   Collection Time: 08/29/21 12:35 PM   Specimen: Wound  Result Value Ref Range Status   Specimen Description   Final    WOUND LEFT FOOT Performed at [place] [address], [place], [place] [address]    Special Requests   Final    NONE Performed at [facility], [place] [address], [place], [place] [address]    Gram Stain   Final    NO SQUAMOUS EPITHELIAL CELLS SEEN NO WBC SEEN MODERATE GRAM NEGATIVE RODS MODERATE GRAM POSITIVE COCCI    Culture   Final    ABUNDANT PROTEUS MIRABILIS MODERATE ENTEROCOCCUS FAECALIS NO ANAEROBES ISOLATED Performed at [place], [address], [place],  [address]    Report Status 09/03/2021 FINAL  Final   Organism ID, Bacteria PROTEUS MIRABILIS  Final   Organism ID, Bacteria ENTEROCOCCUS FAECALIS  Final      Susceptibility   Enterococcus faecalis - MIC*    AMPICILLIN <=2 SENSITIVE Sensitive     VANCOMYCIN 1 SENSITIVE Sensitive     GENTAMICIN SYNERGY SENSITIVE  Sensitive     * MODERATE ENTEROCOCCUS FAECALIS   Proteus mirabilis - MIC*    AMPICILLIN <=2 SENSITIVE Sensitive     CEFAZOLIN <=4 SENSITIVE Sensitive     CEFEPIME <=0.12 SENSITIVE Sensitive     CEFTAZIDIME <=1 SENSITIVE Sensitive     CEFTRIAXONE <=0.25 SENSITIVE Sensitive     CIPROFLOXACIN <=0.25 SENSITIVE Sensitive     GENTAMICIN <=1 SENSITIVE Sensitive     IMIPENEM 2 SENSITIVE Sensitive     TRIMETH/SULFA <=20 SENSITIVE Sensitive     AMPICILLIN/SULBACTAM <=2 SENSITIVE Sensitive     PIP/TAZO <=4 SENSITIVE Sensitive     * ABUNDANT PROTEUS MIRABILIS  Blood culture (routine x 2)     Status: None   Collection Time: 08/29/21  6:10 PM   Specimen: BLOOD  Result Value Ref Range Status   Specimen Description   Final    BLOOD BLOOD RIGHT HAND Performed at [facility] [address], [place], [place] [address]    Special Requests   Final    BOTTLES DRAWN AEROBIC ONLY Blood Culture results may not be optimal due to an inadequate volume of blood received in culture bottles Performed at [facility] [address], [place], [place] [address]    Culture   Final    NO GROWTH 5 DAYS Performed at [facility], [place] [address] [place], [place] [address]    Report Status 09/04/2021 FINAL  Final  Aerobic/Anaerobic Culture w Gram Stain (surgical/deep wound)     Status: None (Preliminary result)   Collection Time: 08/30/21  9:53 AM   Specimen: Wound  Result Value Ref Range Status   Specimen Description   Final    WOUND LEFT FOOT WOUND Performed at [facility], [place] [address], [place], [place]  [address]    Special Requests   Final    NONE Performed at [facility], [place] [address], [place], [place] [address]    Gram Stain   Final    RARE WBC PRESENT, PREDOMINANTLY PMN MODERATE GRAM POSITIVE COCCI IN PAIRS MODERATE GRAM VARIABLE ROD Performed at [facility], [place] [address], [place], [place] [address]    Culture   Final    MODERATE PROTEUS MIRABILIS FEW ENTEROCOCCUS FAECALIS NO ANAEROBES ISOLATED; CULTURE IN PROGRESS FOR 5 DAYS    Report Status PENDING  Incomplete   Organism ID, Bacteria PROTEUS MIRABILIS  Final   Organism ID, Bacteria ENTEROCOCCUS FAECALIS  Final      Susceptibility   Enterococcus faecalis - MIC*    AMPICILLIN <=2 SENSITIVE Sensitive     VANCOMYCIN 1 SENSITIVE Sensitive     GENTAMICIN SYNERGY SENSITIVE Sensitive     * FEW ENTEROCOCCUS FAECALIS   Proteus mirabilis - MIC*    AMPICILLIN <=2 SENSITIVE Sensitive     CEFAZOLIN <=4 SENSITIVE Sensitive     CEFEPIME <=0.12 SENSITIVE Sensitive     CEFTAZIDIME <=1 SENSITIVE Sensitive     CEFTRIAXONE <=0.25 SENSITIVE Sensitive     CIPROFLOXACIN <=0.25 SENSITIVE Sensitive     GENTAMICIN <=1 SENSITIVE Sensitive     IMIPENEM 2 SENSITIVE Sensitive     TRIMETH/SULFA <=20 SENSITIVE Sensitive     AMPICILLIN/SULBACTAM <=2 SENSITIVE Sensitive     PIP/TAZO <=4 SENSITIVE Sensitive     * MODERATE PROTEUS MIRABILIS  Surgical PCR screen     Status: None   Collection Time: 09/02/21  5:30 AM   Specimen: Nasal Mucosa; Nasal Swab  Result Value Ref Range  Status   MRSA, PCR NEGATIVE NEGATIVE Final   Staphylococcus aureus NEGATIVE NEGATIVE Final    Comment: (NOTE) The Xpert SA Assay (FDA approved for NASAL specimens in patients 68 years of age and older), is one component of a comprehensive surveillance program. It is not intended to diagnose infection nor to guide or monitor treatment. Performed at [facility], [place] [address] [place], [place] [address]   Aerobic/Anaerobic Culture w Gram  Stain (surgical/deep wound)     Status: None (Preliminary result)   Collection Time: 09/02/21  3:07 PM   Specimen: Soft Tissue, Other  Result Value Ref Range Status   Specimen Description WOUND  Final   Special Requests LEFT LEG PUS  Final   Gram Stain   Final    MODERATE WBC PRESENT,BOTH PMN AND MONONUCLEAR FEW GRAM NEGATIVE RODS FEW GRAM POSITIVE COCCI IN PAIRS FEW GRAM POSITIVE COCCI IN CLUSTERS    Culture   Final    ABUNDANT PROTEUS MIRABILIS ABUNDANT ENTEROCOCCUS FAECALIS SUSCEPTIBILITIES TO FOLLOW Performed at [facility], [place] [address] [place], [place] [address]    Report Status PENDING  Incomplete   Organism ID, Bacteria PROTEUS MIRABILIS  Final      Susceptibility   Proteus mirabilis - MIC*    AMPICILLIN <=2 SENSITIVE Sensitive     CEFAZOLIN <=4 SENSITIVE Sensitive     CEFEPIME <=0.12 SENSITIVE Sensitive     CEFTAZIDIME <=1 SENSITIVE Sensitive     CEFTRIAXONE <=0.25 SENSITIVE Sensitive     CIPROFLOXACIN <=0.25 SENSITIVE Sensitive     GENTAMICIN <=1 SENSITIVE Sensitive     IMIPENEM 2 SENSITIVE Sensitive     TRIMETH/SULFA <=20 SENSITIVE Sensitive     AMPICILLIN/SULBACTAM <=2 SENSITIVE Sensitive     PIP/TAZO <=4 SENSITIVE Sensitive     * ABUNDANT PROTEUS MIRABILIS         Radiology Studies: IR Fluoro Guide CV Line Right  Result Date: 09/04/2021 INDICATION: Poor venous access. In need of durable intravenous antibiotic administration. History of chronic renal insufficiency. As such, request made for placement of a tunneled central catheter. EXAM: ULTRASOUND AND FLUOROSCOPIC GUIDED PLACEMENT OF TUNNELED CENTRAL VENOUS CATHETER MEDICATIONS: None. The patient is currently admitted to the hospital receiving intravenous antibiotics. The antibiotic was given in an appropriate time interval prior to skin puncture. ANESTHESIA/SEDATION: None FLUOROSCOPY TIME:  18 seconds (0 mGy) COMPLICATIONS: None immediate. PROCEDURE: Informed written consent was obtained from the  patient after a discussion of the risks, benefits, and alternatives to treatment. Questions regarding the procedure were encouraged and answered. The right neck and chest were prepped with chlorhexidine in a sterile fashion, and a sterile drape was applied covering the operative field. Maximum barrier sterile technique with sterile gowns and gloves were used for the procedure. A timeout was performed prior to the initiation of the procedure. After the overlying soft tissues were anesthetized, a small venotomy incision was created and a micropuncture kit was utilized to access the internal jugular vein. Real-time ultrasound guidance was utilized for vascular access including the acquisition of a permanent ultrasound image documenting patency of the accessed vessel. The microwire was utilized to measure appropriate catheter length. The micropuncture sheath was exchanged for a peel-away sheath over a guidewire. A 5 [place] dual lumen tunneled central venous catheter measuring 24 cm was tunneled in a retrograde fashion from the anterior chest wall to the venotomy incision. The catheter was then placed through the peel-away sheath with tip ultimately positioned at the superior caval-atrial junction. Final  catheter positioning was confirmed and documented with a spot radiographic image. The catheter aspirates and flushes normally. The catheter was flushed with appropriate volume heparin dwells. The catheter exit site was secured with a 0-Prolene retention suture. The venotomy incision was closed with Dermabond and Steri-strips. Dressings were applied. The patient tolerated the procedure well without immediate post procedural complication. FINDINGS: After catheter placement, the tip lies within the superior cavoatrial junction. The catheter aspirates and flushes normally and is ready for immediate use. IMPRESSION: Successful placement of 24 cm dual lumen tunneled central venous catheter via the right internal jugular vein  with tip terminating at the superior caval atrial junction. The catheter is ready for immediate use. Electronically Signed   By: Sandi Mariscal M.D.   On: 09/04/2021 14:45   IR US Guide Vasc Access Right  Result Date: 09/04/2021 INDICATION: Poor venous access. In need of durable intravenous antibiotic administration. History of chronic renal insufficiency. As such, request made for placement of a tunneled central catheter. EXAM: ULTRASOUND AND FLUOROSCOPIC GUIDED PLACEMENT OF TUNNELED CENTRAL VENOUS CATHETER MEDICATIONS: None. The patient is currently admitted to the hospital receiving intravenous antibiotics. The antibiotic was given in an appropriate time interval prior to skin puncture. ANESTHESIA/SEDATION: None FLUOROSCOPY TIME:  18 seconds (0 mGy) COMPLICATIONS: None immediate. PROCEDURE: Informed written consent was obtained from the patient after a discussion of the risks, benefits, and alternatives to treatment. Questions regarding the procedure were encouraged and answered. The right neck and chest were prepped with chlorhexidine in a sterile fashion, and a sterile drape was applied covering the operative field. Maximum barrier sterile technique with sterile gowns and gloves were used for the procedure. A timeout was performed prior to the initiation of the procedure. After the overlying soft tissues were anesthetized, a small venotomy incision was created and a micropuncture kit was utilized to access the internal jugular vein. Real-time ultrasound guidance was utilized for vascular access including the acquisition of a permanent ultrasound image documenting patency of the accessed vessel. The microwire was utilized to measure appropriate catheter length. The micropuncture sheath was exchanged for a peel-away sheath over a guidewire. A 5 [place] dual lumen tunneled central venous catheter measuring 24 cm was tunneled in a retrograde fashion from the anterior chest wall to the venotomy incision. The catheter  was then placed through the peel-away sheath with tip ultimately positioned at the superior caval-atrial junction. Final catheter positioning was confirmed and documented with a spot radiographic image. The catheter aspirates and flushes normally. The catheter was flushed with appropriate volume heparin dwells. The catheter exit site was secured with a 0-Prolene retention suture. The venotomy incision was closed with Dermabond and Steri-strips. Dressings were applied. The patient tolerated the procedure well without immediate post procedural complication. FINDINGS: After catheter placement, the tip lies within the superior cavoatrial junction. The catheter aspirates and flushes normally and is ready for immediate use. IMPRESSION: Successful placement of 24 cm dual lumen tunneled central venous catheter via the right internal jugular vein with tip terminating at the superior caval atrial junction. The catheter is ready for immediate use. Electronically Signed   By: Sandi Mariscal M.D.   On: 09/04/2021 14:45        Scheduled Meds:  amLODipine  10 mg Oral Daily   vitamin C  1,000 mg Oral Daily   aspirin EC  81 mg Oral Daily   atorvastatin  20 mg Oral Daily   carvedilol  25 mg Oral BID WC   docusate sodium  100 mg Oral Daily   feeding supplement  237 mL Oral BID BM   ferrous sulfate  325 mg Oral Q breakfast   gabapentin  300 mg Oral QHS   insulin aspart  0-15 Units Subcutaneous TID WC   multivitamin with minerals  1 tablet Oral Daily   mupirocin ointment  1 application  Nasal BID   nutrition supplement (JUVEN)  1 packet Oral BID BM   pantoprazole  40 mg Oral Daily   zinc sulfate  220 mg Oral Daily   Continuous Infusions:  albumin human 25 g (09/05/21 1438)    ceFAZolin (ANCEF) IV 2 g (09/05/21 0918)   DAPTOmycin (CUBICIN) 550 mg in sodium chloride 0.9 % IVPB 550 mg (09/03/21 2229)   magnesium sulfate bolus IVPB       LOS: 7 days    Time spent: 35 minutes    Irine Seal, MD [facility]   To contact the attending provider between 7A-7P or the covering provider during after hours 7P-7A, please log into the web site [email or web address] and access using universal [place] password for that web site. If you do not have the password, please call the hospital operator.  09/05/2021, 6:49 PM

## 2021-09-05 NOTE — Progress Notes (Signed)
Occupational Therapy Treatment Patient Details Name: [name] MRN: [id number] DOB: 12/09/1970 Today's Date: 09/05/2021   History of present illness Pt is a 51 yr old male who who presented 08/29/21 due to L foot infection, found to have osteo of the cuboid and fifth metatarsal. 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 PMH: allergies, iron deficiency anemia, 3B stage CKD, CAD, constipation, type II DM, glaucoma, hyperlipidemia, hypertension, diabetic peripheral neuropathy, diabetic retinopathy, tobacco use, multiple lower extremities I&D and amputations   OT comments  Pt seen for first OT session s/p L AKA. Pt hesitant for therapy attempts as he felt it was too early after surgery to begin therapy and also distracted by cellphone during session. With encouragement and focus on building rapport, pt able to demo good UB strength to sit EOB with no more than Min A. Fair static sitting balance also noted. Educated re: expected trajectory of therapy, compensatory strategies for LB ADLs, positioning of L residual limb in bed, and UB exercises that can be completed in bed. Pt left sitting EOB to complete bathing task with nursing student. Continue to recommend SNF rehab at [facility].    Recommendations for follow up therapy are one component of a multi-disciplinary discharge planning process, led by the attending physician.  Recommendations may be updated based on patient status, additional functional criteria and insurance authorization.    Follow Up Recommendations  Skilled nursing-short term rehab (<3 hours/day) (does not qualify for CIR per notes)    Assistance Recommended at Discharge Frequent or constant Supervision/Assistance  Patient can return home with the following  A lot of help with walking and/or transfers;A lot of help with bathing/dressing/bathroom;Assistance with cooking/housework;Assist for transportation;Direct supervision/assist for medications management;Direct  supervision/assist for financial management   Equipment Recommendations  Wheelchair (measurements OT);Wheelchair cushion (measurements OT)    Recommendations for Other Services      Precautions / Restrictions Precautions Precautions: Fall;Other (comment) Precaution Comments: wound vac L LE Restrictions Weight Bearing Restrictions: Yes LLE Weight Bearing: Non weight bearing       Mobility Bed Mobility Overal bed mobility: Needs Assistance Bed Mobility: Supine to Sit     Supine to sit: Min assist, HOB elevated     General bed mobility comments: cues to use bed rails, light handheld assist for pt to lift trunk    Transfers                         Balance Overall balance assessment: Needs assistance Sitting-balance support: Feet supported, No upper extremity supported, Single extremity supported Sitting balance-Leahy Scale: Fair Sitting balance - Comments: able to sit EOB without support statically                                   ADL either performed or assessed with clinical judgement   ADL Overall ADL's : Needs assistance/impaired Eating/Feeding: Bed level;Modified independent   Grooming: Min guard;Sitting   Upper Body Bathing: Minimal assistance;Sitting   Lower Body Bathing: Maximal assistance;Sitting/lateral leans;Bed level   Upper Body Dressing : Minimal assistance;Sitting   Lower Body Dressing: Maximal assistance;Sitting/lateral leans;Bed level       Toileting- Clothing Manipulation and Hygiene: Total assistance;Sitting/lateral lean;Bed level         General ADL Comments: Educated regarding positioning of L residual limb, exercises to complete bed level and progression of therapies. pt hesitant  for OOB attempts, increased time needed to engage in EOB    Extremity/Trunk Assessment Upper Extremity Assessment Upper Extremity Assessment: Overall WFL for tasks assessed   Lower Extremity Assessment Lower Extremity Assessment:  Defer to PT evaluation RLE Deficits / Details: prior TMA of R foot; reports sensation deficits; gross functional weakness noted        Vision   Vision Assessment?: No apparent visual deficits   Perception     Praxis      Cognition Arousal/Alertness: Awake/alert Behavior During Therapy: WFL for tasks assessed/performed Overall Cognitive Status: Within Functional Limits for tasks assessed                                          Exercises Exercises: Other exercises Other Exercises Other Exercises: use of bedrails to pull self forward with BUE    Shoulder Instructions       General Comments Significant time needed to build rapport and attempt EOB as pt felt it was too early after sx to begin therapy    Pertinent Vitals/ Pain       Pain Assessment Pain Assessment: Faces  Home Living Family/patient expects to be discharged to:: Private residence Living Arrangements: Spouse/significant other Available Help at Discharge: Family Type of Home: House Home Access: Ramped entrance     Home Layout: One level     Bathroom Shower/Tub: Teacher, early years/pre: Standard Bathroom Accessibility: Yes   Home Equipment: Cane - single Barista (2 wheels);Wheelchair - manual;BSC/3in1;Shower seat   Additional Comments: Lives with girlfriend who has CP and stays in her manual w/c at all times      Prior Functioning/Environment              Frequency  Min 2X/week        Progress Toward Goals  OT Goals(current goals can now be found in the care plan section)  Progress towards OT goals: Progressing toward goals  Acute Rehab OT Goals Patient Stated Goal: get a prosthetic OT Goal Formulation: With patient Time For Goal Achievement: 09/17/21 Potential to Achieve Goals: Fair ADL Goals Pt Will Perform Grooming: with set-up;sitting Pt Will Perform Upper Body Bathing: with set-up;sitting Pt Will Perform Lower Body Bathing: with mod  assist;sitting/lateral leans;sit to/from stand Pt Will Transfer to Toilet: with min assist;with +2 assist;squat pivot transfer;bedside commode  Plan Discharge plan remains appropriate    Co-evaluation                 AM-PAC OT "6 Clicks" Daily Activity     Outcome Measure   Help from another person eating meals?: A Little Help from another person taking care of personal grooming?: A Lot Help from another person toileting, which includes using toliet, bedpan, or urinal?: A Lot Help from another person bathing (including washing, rinsing, drying)?: A Lot Help from another person to put on and taking off regular upper body clothing?: A Little Help from another person to put on and taking off regular lower body clothing?: A Lot 6 Click Score: 14    End of Session    OT Visit Diagnosis: Unsteadiness on feet (R26.81);Other abnormalities of gait and mobility (R26.89);Repeated falls (R29.6);Muscle weakness (generalized) (M62.81);Pain Pain - Right/Left: Left Pain - part of body: Leg   Activity Tolerance Patient tolerated treatment well;Patient limited by pain   Patient Left in bed;with call bell/phone within reach  Nurse Communication Mobility status        Time: 1552-0802 OT Time Calculation (min): 43 min  Charges: OT General Charges $OT Visit: 1 Visit OT Treatments $Self Care/Home Management : 8-22 mins $Therapeutic Activity: 23-37 mins  Malachy Chamber, OTR/L Acute Rehab Services Office: [phone]   Layla Maw 09/05/2021, 11:43 AM

## 2021-09-05 NOTE — Progress Notes (Signed)
Patient ID: [name], male   DOB: 07/16/1970, 51 y.o.   MRN: [id number] Patient is postoperative day 1 above-the-knee amputation for sepsis that extended up to the popliteal fossa on the left.  Margins were clear.  Patient is in good spirits this morning no complaints.  There is no drainage in the wound VAC canister.  Patient should not need long-term antibiotics for the left lower extremity infection.

## 2021-09-05 NOTE — Progress Notes (Addendum)
[facility] for Infectious Disease    Date of Admission:  08/29/2021   Total days of antibiotics 8           ID: [name] is a 51 y.o. male with  left leg polymicrobial osteomyelitis s/p aka Principal Problem:   Diabetic ulcer of left foot associated with type 2 diabetes mellitus ([place]) Active Problems:   Essential hypertension   Coronary atherosclerosis of native coronary artery   Osteomyelitis of foot, acute ([name])   Anemia in chronic kidney disease (CKD)   Type 2 diabetes mellitus with hyperglycemia ([name])   Acute kidney injury superimposed on CKD ([place])   Mixed hyperlipidemia   Severe protein-calorie malnutrition ([name])   Transaminitis   CKD (chronic kidney disease), stage IV ([name])   Dehiscence of amputation stump of left lower extremity ([name])    Subjective:  Patient underwent AKA yesterday due to extent of infection, now has clean margins presumably  Remains afebrile; having some pain at surgery site  Medications:   amLODipine  10 mg Oral Daily   vitamin C  1,000 mg Oral Daily   aspirin EC  81 mg Oral Daily   atorvastatin  20 mg Oral Daily   carvedilol  25 mg Oral BID WC   docusate sodium  100 mg Oral Daily   feeding supplement  237 mL Oral BID BM   ferrous sulfate  325 mg Oral Q breakfast   gabapentin  300 mg Oral QHS   insulin aspart  0-15 Units Subcutaneous TID WC   multivitamin with minerals  1 tablet Oral Daily   mupirocin ointment  1 application  Nasal BID   nutrition supplement (JUVEN)  1 packet Oral BID BM   pantoprazole  40 mg Oral Daily   zinc sulfate  220 mg Oral Daily    Objective: Vital signs in last 24 hours: Temp:  [97.5 F (36.4 C)-98.9 F (37.2 C)] 98 F (36.7 C) (06/10 1203) Pulse Rate:  [71-81] 77 (06/10 1203) Resp:  [10-18] 16 (06/10 1203) BP: (128-172)/(72-87) 152/82 (06/10 1203) SpO2:  [96 %-100 %] 100 % (06/10 1203)  Physical Exam  Constitutional: He is oriented to person, place, and time. He appears well-developed and  well-nourished. No distress.  HENT:  Mouth/Throat: Oropharynx is clear and moist. No oropharyngeal exudate.  Cardiovascular: Normal rate, regular rhythm and normal heart sounds. Exam reveals no gallop and no friction rub.  No murmur heard.  Chest = left chest wall central line placed Pulmonary/Chest: Effort normal and breath sounds normal. No respiratory distress. He has no wheezes.  Abdominal: Soft. Bowel sounds are normal. He exhibits no distension. There is no tenderness.  UGQ:BVQX leg aka with wound vac in place Neurological: He is alert and oriented to person, place, and time.  Skin: Skin is warm and dry. No rash noted. No erythema.  Psychiatric: He has a normal mood and affect. His behavior is normal.    Lab Results Recent Labs    09/04/21 0755 09/05/21 0142  WBC 39.1* 27.7*  HGB 9.0* 8.5*  HCT 27.1* 26.2*  NA 142 138  K 4.1 5.0  CL 108 111  CO2 22 22  BUN 97* 85*  CREATININE 3.36* 3.01*   Liver Panel Recent Labs    09/03/21 0149 09/04/21 0755 09/05/21 0142  PROT 5.7* 5.8*  --   ALBUMIN <1.5* <1.5*  <1.5* <1.5*  AST 42* 23  --   ALT 36 19  --   ALKPHOS 101 97  --  BILITOT 1.0 0.7  --   BILIDIR  --  0.2  --   IBILI  --  0.5  --    Sedimentation Rate Recent Labs    09/03/21 1108  ESRSEDRATE 75*   C-Reactive Protein Recent Labs    09/03/21 1108  CRP 19.9*    Microbiology: Proteus mirabilis      MIC    AMPICILLIN <=2 SENSITIVE  Sensitive    AMPICILLIN/SULBACTAM <=2 SENSITIVE  Sensitive    CEFAZOLIN <=4 SENSITIVE  Sensitive    CEFEPIME <=0.12 SENS... Sensitive    CEFTAZIDIME <=1 SENSITIVE  Sensitive    CEFTRIAXONE <=0.25 SENS... Sensitive    CIPROFLOXACIN <=0.25 SENS... Sensitive    GENTAMICIN <=1 SENSITIVE  Sensitive    IMIPENEM 2 SENSITIVE  Sensitive    PIP/TAZO <=4 SENSITIVE  Sensitive    TRIMETH/SULFA <=20 SENSIT... Sensitive          Proteus mirabilis Enterococcus faecalis      MIC MIC    AMPICILLIN <=2 SENSITIVE  Sensitive <=2  SENSITIVE  Sensitive    AMPICILLIN/SULBACTAM <=2 SENSITIVE  Sensitive      CEFAZOLIN <=4 SENSITIVE  Sensitive      CEFEPIME <=0.12 SENS... Sensitive      CEFTAZIDIME <=1 SENSITIVE  Sensitive      CEFTRIAXONE <=0.25 SENS... Sensitive      CIPROFLOXACIN <=0.25 SENS... Sensitive      GENTAMICIN <=1 SENSITIVE  Sensitive      GENTAMICIN SYNERGY   SENSITIVE  Sensitive    IMIPENEM 2 SENSITIVE  Sensitive      PIP/TAZO <=4 SENSITIVE  Sensitive      TRIMETH/SULFA <=20 SENSIT... Sensitive      VANCOMYCIN   1 SENSITIVE  Sensitive    Studies/Results: IR Fluoro Guide CV Line Right  Result Date: 09/04/2021 INDICATION: Poor venous access. In need of durable intravenous antibiotic administration. History of chronic renal insufficiency. As such, request made for placement of a tunneled central catheter. EXAM: ULTRASOUND AND FLUOROSCOPIC GUIDED PLACEMENT OF TUNNELED CENTRAL VENOUS CATHETER MEDICATIONS: None. The patient is currently admitted to the hospital receiving intravenous antibiotics. The antibiotic was given in an appropriate time interval prior to skin puncture. ANESTHESIA/SEDATION: None FLUOROSCOPY TIME:  18 seconds (0 mGy) COMPLICATIONS: None immediate. PROCEDURE: Informed written consent was obtained from the patient after a discussion of the risks, benefits, and alternatives to treatment. Questions regarding the procedure were encouraged and answered. The right neck and chest were prepped with chlorhexidine in a sterile fashion, and a sterile drape was applied covering the operative field. Maximum barrier sterile technique with sterile gowns and gloves were used for the procedure. A timeout was performed prior to the initiation of the procedure. After the overlying soft tissues were anesthetized, a small venotomy incision was created and a micropuncture kit was utilized to access the internal jugular vein. Real-time ultrasound guidance was utilized for vascular access including the acquisition of a  permanent ultrasound image documenting patency of the accessed vessel. The microwire was utilized to measure appropriate catheter length. The micropuncture sheath was exchanged for a peel-away sheath over a guidewire. A 5 [place] dual lumen tunneled central venous catheter measuring 24 cm was tunneled in a retrograde fashion from the anterior chest wall to the venotomy incision. The catheter was then placed through the peel-away sheath with tip ultimately positioned at the superior caval-atrial junction. Final catheter positioning was confirmed and documented with a spot radiographic image. The catheter aspirates and flushes normally. The catheter was flushed with  appropriate volume heparin dwells. The catheter exit site was secured with a 0-Prolene retention suture. The venotomy incision was closed with Dermabond and Steri-strips. Dressings were applied. The patient tolerated the procedure well without immediate post procedural complication. FINDINGS: After catheter placement, the tip lies within the superior cavoatrial junction. The catheter aspirates and flushes normally and is ready for immediate use. IMPRESSION: Successful placement of 24 cm dual lumen tunneled central venous catheter via the right internal jugular vein with tip terminating at the superior caval atrial junction. The catheter is ready for immediate use. Electronically Signed   By: Sandi Mariscal M.D.   On: 09/04/2021 14:45   IR US Guide Vasc Access Right  Result Date: 09/04/2021 INDICATION: Poor venous access. In need of durable intravenous antibiotic administration. History of chronic renal insufficiency. As such, request made for placement of a tunneled central catheter. EXAM: ULTRASOUND AND FLUOROSCOPIC GUIDED PLACEMENT OF TUNNELED CENTRAL VENOUS CATHETER MEDICATIONS: None. The patient is currently admitted to the hospital receiving intravenous antibiotics. The antibiotic was given in an appropriate time interval prior to skin puncture.  ANESTHESIA/SEDATION: None FLUOROSCOPY TIME:  18 seconds (0 mGy) COMPLICATIONS: None immediate. PROCEDURE: Informed written consent was obtained from the patient after a discussion of the risks, benefits, and alternatives to treatment. Questions regarding the procedure were encouraged and answered. The right neck and chest were prepped with chlorhexidine in a sterile fashion, and a sterile drape was applied covering the operative field. Maximum barrier sterile technique with sterile gowns and gloves were used for the procedure. A timeout was performed prior to the initiation of the procedure. After the overlying soft tissues were anesthetized, a small venotomy incision was created and a micropuncture kit was utilized to access the internal jugular vein. Real-time ultrasound guidance was utilized for vascular access including the acquisition of a permanent ultrasound image documenting patency of the accessed vessel. The microwire was utilized to measure appropriate catheter length. The micropuncture sheath was exchanged for a peel-away sheath over a guidewire. A 5 [place] dual lumen tunneled central venous catheter measuring 24 cm was tunneled in a retrograde fashion from the anterior chest wall to the venotomy incision. The catheter was then placed through the peel-away sheath with tip ultimately positioned at the superior caval-atrial junction. Final catheter positioning was confirmed and documented with a spot radiographic image. The catheter aspirates and flushes normally. The catheter was flushed with appropriate volume heparin dwells. The catheter exit site was secured with a 0-Prolene retention suture. The venotomy incision was closed with Dermabond and Steri-strips. Dressings were applied. The patient tolerated the procedure well without immediate post procedural complication. FINDINGS: After catheter placement, the tip lies within the superior cavoatrial junction. The catheter aspirates and flushes normally  and is ready for immediate use. IMPRESSION: Successful placement of 24 cm dual lumen tunneled central venous catheter via the right internal jugular vein with tip terminating at the superior caval atrial junction. The catheter is ready for immediate use. Electronically Signed   By: Sandi Mariscal M.D.   On: 09/04/2021 14:45     Assessment/Plan: A/P: osteomyelitis of left leg, now with source control since having AKA on 6/9  Polymicrobial wound = recommend to continue on daptomycin QOD and cefazolin for the time being until his WBC normalizes over the next 7 days then can discontinue IV tunneled line. AKA has helped decrease the duration of abtx course - through 6/17.  Aka = wound care and wound vac per dr duda.  Leukocytosis = improving since  amputation and source control. Today at 27.7K. recommend to check on Monday to ensure it is trending downward  Aki = being followed by renal but patient has non-oliguric stage 4. Will avoid nephrotoxic drugs. Has been discontinued from vancomycin and now on daptomycin to cover enterococcal infection. Abtx have been changed to renal dosing.  Will sign off  [facility] for Infectious Diseases Pager: [phone]  09/05/2021, 4:18 PM

## 2021-09-06 DIAGNOSIS — E11621 Type 2 diabetes mellitus with foot ulcer: Secondary | ICD-10-CM | POA: Diagnosis not present

## 2021-09-06 DIAGNOSIS — M869 Osteomyelitis, unspecified: Secondary | ICD-10-CM | POA: Diagnosis not present

## 2021-09-06 DIAGNOSIS — N184 Chronic kidney disease, stage 4 (severe): Secondary | ICD-10-CM | POA: Diagnosis not present

## 2021-09-06 DIAGNOSIS — N179 Acute kidney failure, unspecified: Secondary | ICD-10-CM | POA: Diagnosis not present

## 2021-09-06 LAB — GLUCOSE, CAPILLARY
Glucose-Capillary: 157 mg/dL — ABNORMAL HIGH (ref 70–99)
Glucose-Capillary: 121 mg/dL — ABNORMAL HIGH (ref 70–99)
Glucose-Capillary: 181 mg/dL — ABNORMAL HIGH (ref 70–99)
Glucose-Capillary: 207 mg/dL — ABNORMAL HIGH (ref 70–99)
Glucose-Capillary: 279 mg/dL — ABNORMAL HIGH (ref 70–99)

## 2021-09-06 LAB — CBC
HCT: 23.2 % — ABNORMAL LOW (ref 39.0–52.0)
Hemoglobin: 7.4 g/dL — ABNORMAL LOW (ref 13.0–17.0)
MCH: 26.6 pg (ref 26.0–34.0)
MCHC: 31.9 g/dL (ref 30.0–36.0)
MCV: 83.5 fL (ref 80.0–100.0)
Platelets: 422 10*3/uL — ABNORMAL HIGH (ref 150–400)
RBC: 2.78 MIL/uL — ABNORMAL LOW (ref 4.22–5.81)
RDW: 16 % — ABNORMAL HIGH (ref 11.5–15.5)
WBC: 24.2 10*3/uL — ABNORMAL HIGH (ref 4.0–10.5)
nRBC: 0 % (ref 0.0–0.2)

## 2021-09-06 LAB — IRON AND TIBC: Iron: 75 ug/dL (ref 45–182)

## 2021-09-06 LAB — RENAL FUNCTION PANEL
Albumin: 2.2 g/dL — ABNORMAL LOW (ref 3.5–5.0)
Anion gap: 6 (ref 5–15)
BUN: 73 mg/dL — ABNORMAL HIGH (ref 6–20)
CO2: 26 mmol/L (ref 22–32)
Calcium: 7.7 mg/dL — ABNORMAL LOW (ref 8.9–10.3)
Chloride: 109 mmol/L (ref 98–111)
Creatinine, Ser: 2.93 mg/dL — ABNORMAL HIGH (ref 0.61–1.24)
GFR, Estimated: 25 mL/min — ABNORMAL LOW (ref >60)
Glucose, Bld: 191 mg/dL — ABNORMAL HIGH (ref 70–99)
Phosphorus: 3.1 mg/dL (ref 2.5–4.6)
Potassium: 4.2 mmol/L (ref 3.5–5.1)
Sodium: 141 mmol/L (ref 135–145)

## 2021-09-06 LAB — FERRITIN: Ferritin: 525 ng/mL — ABNORMAL HIGH (ref 24–336)

## 2021-09-06 LAB — FOLATE: Folate: 7.5 ng/mL (ref >5.9)

## 2021-09-06 LAB — VITAMIN B12: Vitamin B-12: 685 pg/mL (ref 180–914)

## 2021-09-06 MED ORDER — SODIUM CHLORIDE 0.9% FLUSH
10.0000 mL | INTRAVENOUS | Status: DC | PRN
  Administered 2021-09-08: 10 mL

## 2021-09-06 MED ORDER — CHLORHEXIDINE GLUCONATE CLOTH 2 % EX PADS
6.0000 | MEDICATED_PAD | Freq: Every day | CUTANEOUS | Status: DC
  Administered 2021-09-06 – 2021-09-15 (×10): 6 via TOPICAL

## 2021-09-06 MED ORDER — CEFAZOLIN SODIUM-DEXTROSE 2-4 GM/100ML-% IV SOLN
2.0000 g | Freq: Three times a day (TID) | INTRAVENOUS | Status: AC
  Administered 2021-09-06 – 2021-09-12 (×20): 2 g via INTRAVENOUS
  Filled 2021-09-06 (×20): qty 100

## 2021-09-06 NOTE — Progress Notes (Signed)
PROGRESS NOTE    [name]  TZG:[id number] DOB: 1971/01/13 DOA: 08/29/2021 PCP: Camillia Herter, NP    Chief Complaint  Patient presents with   Fall    Brief Narrative:  [name] is a 51 y.o. 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  He was evaluated by surgery who recommended transtibial amputation.   6/3: Admitted, Cr 5.2 on admission (from baseline 2.5); Podiatry evaluated, MRI showed osteo, no salvage operations were judged possible 6/5: Transferred to [facility], ABIs done, inconclusive    Assessment & Plan:  Principal Problem:   Diabetic ulcer of left foot associated with type 2 diabetes mellitus ([place]) Active Problems:   Osteomyelitis of foot, acute ([place])   Acute kidney injury superimposed on CKD ([place])   Essential hypertension   Coronary atherosclerosis of native coronary artery   Anemia in chronic kidney disease (CKD)   Type 2 diabetes mellitus with hyperglycemia ([name])   Mixed hyperlipidemia   Severe protein-calorie malnutrition ([name])   Transaminitis   CKD (chronic kidney disease), stage IV ([name])   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  -Op note with findings of purulent abscess that extended up to the popliteal fossa with necrotic muscle along the medial head of the gastrocnemius muscle and medial soleus.   -ID consulted and recommended discontinuation of IV vancomycin and IV cefepime and placing patient on daptomycin plus cefazolin.  -ID initially was recommending at least 4 weeks of IV antibiotics and recommending to check a sed rate and CRP. -Due to renal function unable to  place a PICC at this time, nephrology recommending tunneled central line for prolonged antibiotics which was placed 09/04/2021 per IR. -Patient taken back to the OR per orthopedics, Dr. Sharol Given on 09/04/2021 and patient noted to have purulence draining from below the knee amputation and as such orthopedics proceeded with left AKA. -Per orthopedics clear margins noted. -ID following and recommending to continue on daptomycin every other day and cefazolin for the time being until white count normalizes over the next 7 days they can discontinue IV tunneled line.  - Continue nutritional supplements, vitamins -ID and orthopedics following and appreciate input and recommendations.  Acute kidney injury superimposed on CKD [facility]) Renal function 5.2 on admission, up from basleine around 2.5-2.8 since Jan. Here, was initially improving, but in last day, despite bicarb fluids, trending back up >4 mg/dL, in setting of vancomycin use. -Patient also noted to be taking NSAIDs prior to admission. - Patient seen in consultation by nephrology and patient placed on a bicarb drip.  -Renal function slowly improved and seems to be stabilizing currently at 2.93 today.   -Bicarb drip has been discontinued. -Nephrology was following but have signed off on 09/05/2021 with recommendations for outpatient follow-up with Dr. [name] after discharge. -Avoid nephrotoxic agents.    CKD (chronic kidney disease), stage IV ([place]) Cr seems to have worsened since about Jan when he had endocarditis. Now 2.5-2.8 baseline. - Nephrology following.  Transaminitis -Total bilirubin has improved currently down to 0.7 -Follow.  Severe protein-calorie malnutrition ([name]) - Albumin was at  < 1.5. -IV  albumin every 6 hours x24 to 48 hours was given, albumin currently at 2.2 today. -continue nutritional supplementation with Ensure.  Mixed hyperlipidemia - Repeat LFTs stable.   -Statin resumed.    Type 2 diabetes mellitus with hyperglycemia  ([name]) - Hemoglobin A1c 7.5 (08/30/2021 ). -CBG 28 of basic metabolic profile the morning of 09/02/2021. -Patient noted to have received Semglee while n.p.o., which has been discontinued. -CBGs have improved and CBG of 157 this morning. -Discontinued D5 normal saline. -Continue Semglee 10 units daily as patient currently postop. -SSI.  Anemia in chronic kidney disease (CKD) Hgb slowly trickling down currently at 7.4. - Continue iron -Follow.  Iron deficiency anemia - Continue iron  Coronary atherosclerosis of native coronary artery - Continue aspirin, BB, statin.  Essential hypertension - Blood pressure controlled on current regimen of amlodipine, Coreg.   -Continue to hold home regimen clonidine.           DVT prophylaxis: Per orthopedics Code Status: Full Family Communication: Updated patient, no family at bedside Disposition: TBD  Status is: Inpatient Remains inpatient appropriate because: Severity of illness   Consultants:  Orthopedics: Dr. Sharol Given 08/31/2021 Podiatry: Dr. Jacqualyn Posey 08/29/2021 Nephrology: Dr. Marval Regal 09/01/2021 Infectious disease: Dr. Baxter Flattery 09/03/2021  Procedures:  Plain films of the left foot 09/25/2021 MRI left foot 08/29/2021 Renal ultrasound 09/01/2021 ABI bilateral 08/31/2021 Left BKA per orthopedics: Dr. Sharol Given 09/02/2021 Irrigation debridement left foot, bone biopsy.  Podiatry: Dr. Jacqualyn Posey 08/30/2021 Right jugular tunneled dual-lumen CVC line placement per IR, Dr. Pascal Lux 04/05/4079 Left AKA application of wound VAC per Dr. Sharol Given 09/04/2021   Antimicrobials:  Oral Flagyl 08/29/2021>>>> 09/05/2021 IV cefepime 08/29/2021>>>> 09/03/2021 IV vancomycin 08/29/2021>>> 09/03/2021 IV daptomycin 09/03/2021>>>> IV Ancef 09/03/2021>>>>>    Subjective: Laying in bed.  States appetite improving.  No chest pain.  No shortness of breath.  No abdominal pain.  Overall continues to feel well.    Objective: Vitals:   09/05/21 2118 09/06/21 0412 09/06/21 0931 09/06/21 1712  BP: (!) 158/86  (!) 144/77 (!) 148/76 (!) 143/68  Pulse: 85 80 83 87  Resp: '17  16 16  '$ Temp: 97.7 F (36.5 C) 98.2 F (36.8 C) 97.9 F (36.6 C) 97.9 F (36.6 C)  TempSrc: Oral Oral Oral Oral  SpO2: 100% 96% 98% 96%  Weight:      Height:        Intake/Output Summary (Last 24 hours) at 09/06/2021 1755 Last data filed at 09/06/2021 0636 Gross per 24 hour  Intake 442.53 ml  Output 600 ml  Net -157.47 ml   Filed Weights   08/29/21 1125 09/02/21 1206 09/04/21 1204  Weight: 87.5 kg 87.5 kg 88.5 kg    Examination:  General exam: NAD. Respiratory system: Lungs clear to auscultation bilaterally.  No wheezes, no crackles, no rhonchi.  Fair air movement.  Speaking in full sentences.  Cardiovascular system: Regular rate rhythm no murmurs rubs or gallops.  No JVD.  Gastrointestinal system: Abdomen is soft, nontender, nondistended, positive bowel sounds.  No rebound.  No guarding Central nervous system: Alert and oriented. No focal neurological deficits. Extremities: Status post left AKA with wound VAC noted.  Status post right transmetatarsal amputation.  Skin: No rashes, lesions or ulcers Psychiatry: Judgement and insight appear normal. Mood & affect appropriate.     Data Reviewed:   CBC: Recent Labs  Lab 09/02/21 0212 09/03/21 0149 09/04/21 0755 09/05/21 0142 09/06/21 0053  WBC 45.5* 46.8* 39.1* 27.7* 24.2*  NEUTROABS  --   --   --  25.5*  --   HGB 9.6* 10.1* 9.0* 8.5* 7.4*  HCT 26.9* 28.7* 27.1* 26.2* 23.2*  MCV 77.1* 78.6* 80.7 83.2 83.5  PLT 489* 463* 458* 434* 422*    Basic Metabolic Panel: Recent Labs  Lab 08/31/21 0449 09/01/21 0138 09/02/21 0212 09/03/21 0149 09/04/21 0755 09/05/21 0142 09/06/21 0053  NA 138 136 139 136 142 138 141  K 4.9 4.5 3.8 4.9 4.1 5.0 4.2  CL 114* 113* 107 110 108 111 109  CO2 14* 16* 18* 18* '22 22 26  '$ GLUCOSE 233* 204* 28* 231* 112* 167* 191*  BUN 108* 113* 113* 112* 97* 85* 73*  CREATININE 3.84* 4.07* 3.78* 3.69* 3.36* 3.01* 2.93*  CALCIUM  8.3* 8.0* 7.9* 7.4* 7.9* 7.2* 7.7*  MG 2.5* 2.3  --   --   --   --   --   PHOS  --   --   --   --  3.8 3.9 3.1    GFR: Estimated Creatinine Clearance: 29.9 mL/min (A) (by C-G formula based on SCr of 2.93 mg/dL (H)).  Liver Function Tests: Recent Labs  Lab 08/31/21 0449 09/01/21 0138 09/02/21 0212 09/03/21 0149 09/04/21 0755 09/05/21 0142 09/06/21 0053  AST 47* 32 28 42* 23  --   --   ALT 43 41 31 36 19  --   --   ALKPHOS 107 110 124 101 97  --   --   BILITOT 2.0* 1.3* 1.2 1.0 0.7  --   --   PROT 6.5 5.9* 5.9* 5.7* 5.8*  --   --   ALBUMIN 1.8* 1.6* 1.5* <1.5* <1.5*  <1.5* <1.5* 2.2*    CBG: Recent Labs  Lab 09/05/21 2113 09/06/21 0728 09/06/21 1231 09/06/21 1433 09/06/21 1622  GLUCAP 177* 157* 121* 181* 207*     Recent Results (from the past 240 hour(s))  Blood culture (routine x 2)     Status: None   Collection Time: 08/29/21 12:13 PM   Specimen: BLOOD  Result Value Ref Range Status   Specimen Description   Final    BLOOD RIGHT ANTECUBITAL Performed at [facility], [place] [address], [place], [place] [address]    Special Requests   Final    BOTTLES DRAWN AEROBIC AND ANAEROBIC Blood Culture results may not be optimal due to an excessive volume of blood received in culture bottles Performed at [place] [address] [place], [place] [address]    Culture   Final    NO GROWTH 5 DAYS Performed at [facility], [place] [address], [place], [place] [address]    Report Status 09/03/2021 FINAL  Final  Aerobic/Anaerobic Culture w Gram Stain (surgical/deep wound)     Status: None   Collection Time: 08/29/21 12:35 PM   Specimen: Wound  Result Value Ref Range Status   Specimen Description   Final    WOUND LEFT FOOT Performed at [place] [address] [place], [place] [address]    Special Requests   Final    NONE Performed at [facility], [place] [address], [place], [place]  [address]    Gram Stain   Final    NO SQUAMOUS EPITHELIAL CELLS SEEN NO WBC SEEN MODERATE GRAM NEGATIVE RODS MODERATE GRAM POSITIVE COCCI    Culture   Final    ABUNDANT PROTEUS MIRABILIS MODERATE ENTEROCOCCUS FAECALIS NO ANAEROBES ISOLATED Performed at [facility], [address], [place], [place] [address]  Report Status 09/03/2021 FINAL  Final   Organism ID, Bacteria PROTEUS MIRABILIS  Final   Organism ID, Bacteria ENTEROCOCCUS FAECALIS  Final      Susceptibility   Enterococcus faecalis - MIC*    AMPICILLIN <=2 SENSITIVE Sensitive     VANCOMYCIN 1 SENSITIVE Sensitive     GENTAMICIN SYNERGY SENSITIVE Sensitive     * MODERATE ENTEROCOCCUS FAECALIS   Proteus mirabilis - MIC*    AMPICILLIN <=2 SENSITIVE Sensitive     CEFAZOLIN <=4 SENSITIVE Sensitive     CEFEPIME <=0.12 SENSITIVE Sensitive     CEFTAZIDIME <=1 SENSITIVE Sensitive     CEFTRIAXONE <=0.25 SENSITIVE Sensitive     CIPROFLOXACIN <=0.25 SENSITIVE Sensitive     GENTAMICIN <=1 SENSITIVE Sensitive     IMIPENEM 2 SENSITIVE Sensitive     TRIMETH/SULFA <=20 SENSITIVE Sensitive     AMPICILLIN/SULBACTAM <=2 SENSITIVE Sensitive     PIP/TAZO <=4 SENSITIVE Sensitive     * ABUNDANT PROTEUS MIRABILIS  Blood culture (routine x 2)     Status: None   Collection Time: 08/29/21  6:10 PM   Specimen: BLOOD  Result Value Ref Range Status   Specimen Description   Final    BLOOD BLOOD RIGHT HAND Performed at [place] [address], [place], [place] [address]    Special Requests   Final    BOTTLES DRAWN AEROBIC ONLY Blood Culture results may not be optimal due to an inadequate volume of blood received in culture bottles Performed at [place] [address] [place], [place] [address]    Culture   Final    NO GROWTH 5 DAYS Performed at [facility], [place] [address], [place], [place] [address]    Report Status 09/04/2021 FINAL  Final  Aerobic/Anaerobic Culture w Gram Stain  (surgical/deep wound)     Status: None (Preliminary result)   Collection Time: 08/30/21  9:53 AM   Specimen: Wound  Result Value Ref Range Status   Specimen Description   Final    WOUND LEFT FOOT WOUND Performed at [facility], [place] [address], [place], [place] [address]    Special Requests   Final    NONE Performed at [facility], [place] [address], [place], [place] [address]    Gram Stain   Final    RARE WBC PRESENT, PREDOMINANTLY PMN MODERATE GRAM POSITIVE COCCI IN PAIRS MODERATE GRAM VARIABLE ROD    Culture   Final    MODERATE PROTEUS MIRABILIS FEW ENTEROCOCCUS FAECALIS MODERATE BACTEROIDES DISTASONIS BETA LACTAMASE POSITIVE Performed at [facility], [place] [address], [place], [place] [address]    Report Status PENDING  Incomplete   Organism ID, Bacteria PROTEUS MIRABILIS  Final   Organism ID, Bacteria ENTEROCOCCUS FAECALIS  Final      Susceptibility   Enterococcus faecalis - MIC*    AMPICILLIN <=2 SENSITIVE Sensitive     VANCOMYCIN 1 SENSITIVE Sensitive     GENTAMICIN SYNERGY SENSITIVE Sensitive     * FEW ENTEROCOCCUS FAECALIS   Proteus mirabilis - MIC*    AMPICILLIN <=2 SENSITIVE Sensitive     CEFAZOLIN <=4 SENSITIVE Sensitive     CEFEPIME <=0.12 SENSITIVE Sensitive     CEFTAZIDIME <=1 SENSITIVE Sensitive     CEFTRIAXONE <=0.25 SENSITIVE Sensitive     CIPROFLOXACIN <=0.25 SENSITIVE Sensitive     GENTAMICIN <=1 SENSITIVE Sensitive     IMIPENEM 2 SENSITIVE Sensitive     TRIMETH/SULFA <=20 SENSITIVE  Sensitive     AMPICILLIN/SULBACTAM <=2 SENSITIVE Sensitive     PIP/TAZO <=4 SENSITIVE Sensitive     * MODERATE PROTEUS MIRABILIS  Surgical PCR screen     Status: None   Collection Time: 09/02/21  5:30 AM   Specimen: Nasal Mucosa; Nasal Swab  Result Value Ref Range Status   MRSA, PCR NEGATIVE NEGATIVE Final   Staphylococcus aureus NEGATIVE NEGATIVE Final    Comment: (NOTE) The Xpert SA Assay (FDA approved for NASAL specimens in patients  64 years of age and older), is one component of a comprehensive surveillance program. It is not intended to diagnose infection nor to guide or monitor treatment. Performed at [facility], [place] [address], [place], [place] [address]   Aerobic/Anaerobic Culture w Gram Stain (surgical/deep wound)     Status: None (Preliminary result)   Collection Time: 09/02/21  3:07 PM   Specimen: Soft Tissue, Other  Result Value Ref Range Status   Specimen Description WOUND  Final   Special Requests LEFT LEG PUS  Final   Gram Stain   Final    MODERATE WBC PRESENT,BOTH PMN AND MONONUCLEAR FEW GRAM NEGATIVE RODS FEW GRAM POSITIVE COCCI IN PAIRS FEW GRAM POSITIVE COCCI IN CLUSTERS Performed at [facility], [place] [address], [place], [place] [address]    Culture   Final    ABUNDANT PROTEUS MIRABILIS ABUNDANT ENTEROCOCCUS FAECALIS    Report Status PENDING  Incomplete   Organism ID, Bacteria PROTEUS MIRABILIS  Final   Organism ID, Bacteria ENTEROCOCCUS FAECALIS  Final      Susceptibility   Enterococcus faecalis - MIC*    AMPICILLIN <=2 SENSITIVE Sensitive     VANCOMYCIN 1 SENSITIVE Sensitive     GENTAMICIN SYNERGY SENSITIVE Sensitive     * ABUNDANT ENTEROCOCCUS FAECALIS   Proteus mirabilis - MIC*    AMPICILLIN <=2 SENSITIVE Sensitive     CEFAZOLIN <=4 SENSITIVE Sensitive     CEFEPIME <=0.12 SENSITIVE Sensitive     CEFTAZIDIME <=1 SENSITIVE Sensitive     CEFTRIAXONE <=0.25 SENSITIVE Sensitive     CIPROFLOXACIN <=0.25 SENSITIVE Sensitive     GENTAMICIN <=1 SENSITIVE Sensitive     IMIPENEM 2 SENSITIVE Sensitive     TRIMETH/SULFA <=20 SENSITIVE Sensitive     AMPICILLIN/SULBACTAM <=2 SENSITIVE Sensitive     PIP/TAZO <=4 SENSITIVE Sensitive     * ABUNDANT PROTEUS MIRABILIS         Radiology Studies: No results found.      Scheduled Meds:  amLODipine  10 mg Oral Daily   vitamin C  1,000 mg Oral Daily   aspirin EC  81 mg Oral Daily   atorvastatin  20 mg Oral Daily    carvedilol  25 mg Oral BID WC   Chlorhexidine Gluconate Cloth  6 each Topical Daily   docusate sodium  100 mg Oral Daily   feeding supplement  237 mL Oral BID BM   ferrous sulfate  325 mg Oral Q breakfast   gabapentin  300 mg Oral QHS   insulin aspart  0-15 Units Subcutaneous TID WC   insulin glargine-yfgn  10 Units Subcutaneous Daily   multivitamin with minerals  1 tablet Oral Daily   mupirocin ointment  1 application  Nasal BID   nutrition supplement (JUVEN)  1 packet Oral BID BM   pantoprazole  40 mg Oral Daily   zinc sulfate  220 mg Oral Daily   Continuous Infusions:  albumin human 25 g (09/06/21 1439)  ceFAZolin (ANCEF) IV 2 g (09/06/21 0800)   DAPTOmycin (CUBICIN) 550 mg in sodium chloride 0.9 % IVPB Stopped (09/05/21 2148)   magnesium sulfate bolus IVPB       LOS: 8 days    Time spent: 35 minutes    Irine Seal, MD [facility]   To contact the attending provider between 7A-7P or the covering provider during after hours 7P-7A, please log into the web site [email or web address] and access using universal [place] password for that web site. If you do not have the password, please call the hospital operator.  09/06/2021, 5:55 PM

## 2021-09-06 NOTE — Progress Notes (Signed)
Pharmacy Antibiotic Note  [name] is a 51 y.o. 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 Patient then underwent AKA 6/9 with clean margins presumably. Pharmacy has been consulted for Daptomycin + Cefazolin dosing.  L-foot wound cx with pan-sensitive Proteus + E faecalis from 6/4. Cultures from residual infection on 6/7 showing the same organisms.   WBC trending down to 24.2. AKI improving with most recent SCr 2.93. Per ID, continue daptomycin and cefazolin until WBC normalize over next 7 days. EOT 6/17.   Plan: - Adjust Cefazolin 2g IV every 8 hours - Continue Daptomycin 550 mg IV every 48 hours - Continue to monitor for s/sx of infection and clinical course  Height: '5\' 4"'$  (162.6 cm) Weight: 88.5 kg (195 lb) IBW/kg (Calculated) : 59.2  Temp (24hrs), Avg:98.2 F (36.8 C), Min:97.7 F (36.5 C), Max:98.7 F (37.1 C)  Recent Labs  Lab 09/01/21 0138 09/02/21 0212 09/03/21 0149 09/03/21 1108 09/04/21 0755 09/05/21 0142 09/06/21 0053  WBC 50.5* 45.5* 46.8*  --  39.1* 27.7* 24.2*  CREATININE 4.07* 3.78* 3.69*  --  3.36* 3.01* 2.93*  VANCORANDOM 20  --   --  19  --   --   --      Estimated Creatinine Clearance: 29.9 mL/min (A) (by C-G formula based on SCr of 2.93 mg/dL (H)).    Allergies  Allergen Reactions   Bee Venom Swelling    SWELLING REACTION UNSPECIFIED    Penicillins Hives and Other (See Comments)    Full body hives as a child with no shortness of breath or swelling  **Can tolerate cephalosporins**   Clonidine Derivatives Other (See Comments)    Pt states "It is making me dizzy"    Antimicrobials this admission: Vancomycin 6/3 >> 6/7 Cefepime 6/3 >> 6/7 Daptomycin 6/8 >> 6/17 Cefazolin 6/8 >> 6/17  Dose adjustments this admission:   Microbiology results: 6/3 BCx >> ngtd 6/3 + 6/4 L-foot wound cx >> pan-sensitive proteus + E faecalis 6/7 MRSA PCR >> neg 6/7 L-leg cx (pus intra-op) >> abundant  proteus mirabilis and enterococcus faecalis  Thank you for allowing pharmacy to be a part of this patient's care.  Joseph Art, Pharm.D. PGY-1 Pharmacy Resident [phone] 09/06/2021 7:30 AM

## 2021-09-06 NOTE — TOC Progression Note (Signed)
Transition of Care [facility]) - Progression Note    Patient Details  Name: [name] MRN: [id number] Date of Birth: 01-30-1971  Transition of Care [facility]) CM/SW Contact  Lismary Kiehn, [place], [place] Phone Number: 09/06/2021, 1:57 PM  Clinical Narrative:    Met with patient, patient's two sisters and significant other at bedside. Patient resides with significant other, no other services in the home. Patient's primary care doctor is through [facility] and is receives his prescriptions through  [facility]. Patient agreeable to rehab stay. Patient currently has 1 bed offer, would like to see if a few more bed offers are received before selection is made.  Transition of Care to continue to follow  [place], LCSW Transition of Care    Expected Discharge Plan: [place] Barriers to Discharge: Continued Medical Work up  Expected Discharge Plan and Services Expected Discharge Plan: [place] In-house Referral: Clinical Social Work   Post Acute Care Choice: [place]                                         Social Determinants of Health (SDOH) Interventions    Readmission Risk Interventions     No data to display

## 2021-09-07 DIAGNOSIS — D62 Acute posthemorrhagic anemia: Secondary | ICD-10-CM | POA: Diagnosis not present

## 2021-09-07 DIAGNOSIS — E11621 Type 2 diabetes mellitus with foot ulcer: Secondary | ICD-10-CM | POA: Diagnosis not present

## 2021-09-07 DIAGNOSIS — N179 Acute kidney failure, unspecified: Secondary | ICD-10-CM | POA: Diagnosis not present

## 2021-09-07 DIAGNOSIS — M869 Osteomyelitis, unspecified: Secondary | ICD-10-CM | POA: Diagnosis not present

## 2021-09-07 LAB — AEROBIC/ANAEROBIC CULTURE W GRAM STAIN (SURGICAL/DEEP WOUND)

## 2021-09-07 LAB — HEMOGLOBIN AND HEMATOCRIT, BLOOD
HCT: 20.7 % — ABNORMAL LOW (ref 39.0–52.0)
HCT: 29.2 % — ABNORMAL LOW (ref 39.0–52.0)
Hemoglobin: 6.7 g/dL — CL (ref 13.0–17.0)
Hemoglobin: 9.6 g/dL — ABNORMAL LOW (ref 13.0–17.0)

## 2021-09-07 LAB — CBC WITH DIFFERENTIAL/PLATELET
Abs Immature Granulocytes: 0.98 10*3/uL — ABNORMAL HIGH (ref 0.00–0.07)
Basophils Absolute: 0 10*3/uL (ref 0.0–0.1)
Basophils Relative: 0 %
Eosinophils Absolute: 0.3 10*3/uL (ref 0.0–0.5)
Eosinophils Relative: 1 %
HCT: 20.3 % — ABNORMAL LOW (ref 39.0–52.0)
Hemoglobin: 6.6 g/dL — CL (ref 13.0–17.0)
Immature Granulocytes: 5 %
Lymphocytes Relative: 17 %
Lymphs Abs: 3.7 10*3/uL (ref 0.7–4.0)
MCH: 27.6 pg (ref 26.0–34.0)
MCHC: 32.5 g/dL (ref 30.0–36.0)
MCV: 84.9 fL (ref 80.0–100.0)
Monocytes Absolute: 1.2 10*3/uL — ABNORMAL HIGH (ref 0.1–1.0)
Monocytes Relative: 5 %
Neutro Abs: 15.5 10*3/uL — ABNORMAL HIGH (ref 1.7–7.7)
Neutrophils Relative %: 72 %
Platelets: 369 10*3/uL (ref 150–400)
RBC: 2.39 MIL/uL — ABNORMAL LOW (ref 4.22–5.81)
RDW: 16.1 % — ABNORMAL HIGH (ref 11.5–15.5)
WBC: 21.6 10*3/uL — ABNORMAL HIGH (ref 4.0–10.5)
nRBC: 0 % (ref 0.0–0.2)

## 2021-09-07 LAB — RENAL FUNCTION PANEL
Albumin: 2.8 g/dL — ABNORMAL LOW (ref 3.5–5.0)
Anion gap: 7 (ref 5–15)
BUN: 61 mg/dL — ABNORMAL HIGH (ref 6–20)
CO2: 27 mmol/L (ref 22–32)
Calcium: 7.9 mg/dL — ABNORMAL LOW (ref 8.9–10.3)
Chloride: 110 mmol/L (ref 98–111)
Creatinine, Ser: 2.77 mg/dL — ABNORMAL HIGH (ref 0.61–1.24)
GFR, Estimated: 27 mL/min — ABNORMAL LOW (ref >60)
Glucose, Bld: 177 mg/dL — ABNORMAL HIGH (ref 70–99)
Phosphorus: 2.8 mg/dL (ref 2.5–4.6)
Potassium: 4.5 mmol/L (ref 3.5–5.1)
Sodium: 144 mmol/L (ref 135–145)

## 2021-09-07 LAB — GLUCOSE, CAPILLARY
Glucose-Capillary: 181 mg/dL — ABNORMAL HIGH (ref 70–99)
Glucose-Capillary: 124 mg/dL — ABNORMAL HIGH (ref 70–99)
Glucose-Capillary: 161 mg/dL — ABNORMAL HIGH (ref 70–99)
Glucose-Capillary: 159 mg/dL — ABNORMAL HIGH (ref 70–99)

## 2021-09-07 LAB — PREPARE RBC (CROSSMATCH)

## 2021-09-07 LAB — SURGICAL PATHOLOGY

## 2021-09-07 MED ORDER — ACETAMINOPHEN 325 MG PO TABS
650.0000 mg | ORAL_TABLET | Freq: Once | ORAL | Status: AC
  Administered 2021-09-07: 650 mg via ORAL
  Filled 2021-09-07: qty 2

## 2021-09-07 MED ORDER — DIPHENHYDRAMINE HCL 25 MG PO CAPS
25.0000 mg | ORAL_CAPSULE | Freq: Once | ORAL | Status: AC
  Administered 2021-09-07: 25 mg via ORAL
  Filled 2021-09-07: qty 1

## 2021-09-07 MED ORDER — SODIUM CHLORIDE 0.9% IV SOLUTION: Freq: Once | INTRAVENOUS | Status: AC

## 2021-09-07 MED ORDER — ENSURE ENLIVE PO LIQD
237.0000 mL | Freq: Three times a day (TID) | ORAL | Status: DC
Start: 2021-09-07 — End: 2021-09-15
  Administered 2021-09-07 – 2021-09-15 (×20): 237 mL via ORAL

## 2021-09-07 MED ORDER — FUROSEMIDE 10 MG/ML IJ SOLN
20.0000 mg | Freq: Once | INTRAMUSCULAR | Status: AC
  Administered 2021-09-07: 20 mg via INTRAVENOUS
  Filled 2021-09-07: qty 2

## 2021-09-07 NOTE — Assessment & Plan Note (Addendum)
-   Postoperatively hemoglobin has trended down with hemoglobin at 6.6/6.7 the morning of 09/07/2021 and transfused 2 units packed red blood cells with hemoglobin at 9.7.Marland Kitchen -Patient with no overt bleeding. -Repeat labs in the a.m.

## 2021-09-07 NOTE — Progress Notes (Signed)
Patient ID: [name], male   DOB: [birth year], 51 y.o.   MRN: [id number] Patient is a 51 year old gentleman who is status post above-the-knee amputation.  Patient is in good spirits this morning.  No drainage in the wound VAC canister.  Anticipate discharge to skilled nursing.

## 2021-09-07 NOTE — Progress Notes (Signed)
Inpatient Diabetes Program Recommendations  AACE/ADA: New Consensus Statement on Inpatient Glycemic Control   Target Ranges:  Prepandial:   less than 140 mg/dL      Peak postprandial:   less than 180 mg/dL (1-2 hours)      Critically ill patients:  140 - 180 mg/dL    Latest Reference Range & Units 09/06/21 07:28 09/06/21 12:31 09/06/21 14:33 09/06/21 16:22 09/06/21 21:32 09/07/21 07:25  Glucose-Capillary 70 - 99 mg/dL 157 (H) 121 (H) 181 (H) 207 (H) 279 (H) 181 (H)   Review of Glycemic Control  Current orders for Inpatient glycemic control: Semglee 10 units daily, Novolog 0-15 units TID with meals  Inpatient Diabetes Program Recommendations:    Insulin: Please consider ordering Novolog 2 units TID with meals for meal coverage if patient eats at least 50% of meals.  Thanks, Barnie Alderman, RN, MSN, [place] Diabetes Coordinator Inpatient Diabetes Program [phone] (Team Pager from 8am to [place])

## 2021-09-07 NOTE — Progress Notes (Signed)
PROGRESS NOTE    [name]  YDX:[id number] DOB: [birth year] DOA: 08/29/2021 PCP: Joseph Herter, NP    Chief Complaint  Patient presents with   Fall    Brief Narrative:  [name] is a 51 y.o. 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  He was evaluated by surgery who recommended transtibial amputation.   6/3: Admitted, Cr 5.2 on admission (from baseline 2.5); Podiatry evaluated, MRI showed osteo, no salvage operations were judged possible 6/5: Transferred to [facility], ABIs done, inconclusive    Assessment & Plan:  Principal Problem:   Diabetic ulcer of left foot associated with type 2 diabetes mellitus ([place]) Active Problems:   Osteomyelitis of foot, acute ([place])   Acute kidney injury superimposed on CKD ([place])   Essential hypertension   Coronary atherosclerosis of native coronary artery   Anemia in chronic kidney disease (CKD)   Type 2 diabetes mellitus with hyperglycemia ([place])   Mixed hyperlipidemia   Severe protein-calorie malnutrition ([name])   Transaminitis   CKD (chronic kidney disease), stage IV ([name])   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  -Op note with findings of purulent abscess that extended up to the popliteal fossa with necrotic muscle along the medial head of the gastrocnemius muscle and medial soleus.   -ID consulted and recommended discontinuation of IV vancomycin and IV cefepime and placing patient on daptomycin plus cefazolin.  -ID initially was recommending at least 4 weeks of IV antibiotics and recommending to  check a sed rate and CRP. -Due to renal function unable to place a PICC at this time, nephrology recommending tunneled central line for prolonged antibiotics which was placed 09/04/2021 per IR. -Patient taken back to the OR per orthopedics, [name] on 09/04/2021 and patient noted to have purulence draining from below the knee amputation and as such orthopedics proceeded with left AKA. -Per orthopedics clear margins noted. -ID following and recommending to continue on daptomycin every other day and cefazolin for the time being until white count normalizes over the next 7 days then can discontinue IV tunneled line.  - Continue nutritional supplements, vitamins -ID and orthopedics following and appreciate input and recommendations.  Acute kidney injury superimposed on CKD [facility]) Renal function 5.2 on admission, up from basleine around 2.5-2.8 since Jan. Here, was initially improving, but in last day, despite bicarb fluids, trending back up >4 mg/dL, in setting of vancomycin use. -Patient also noted to be taking NSAIDs prior to admission. - Patient seen in consultation by nephrology and patient placed on a bicarb drip.  -Renal function slowly improved and seems to be stabilizing currently at 2.77 today.   -Bicarb drip has been discontinued. -Nephrology was following but have signed off on 09/05/2021 with recommendations for outpatient follow-up with Dr. [name] after discharge. -Avoid nephrotoxic agents.    Acute postoperative anemia due to expected blood loss - Postoperatively hemoglobin has trended down with hemoglobin currently at 6.6/6.7 this morning. -Patient with no overt bleeding. -Transfused 2 units packed red blood cells. -Repeat H&H posttransfusion.  CKD (chronic  kidney disease), stage IV ([place]) Cr seems to have worsened since about Jan when he had endocarditis. Now 2.5-2.8 baseline. - Nephrology following.  Transaminitis -Total bilirubin has improved currently down to  0.7 -Follow.  Severe protein-calorie malnutrition ([name]) - Albumin was at  < 1.5. -IV albumin every 6 hours x24 to 48 hours was [name], albumin currently at 2.8 today. -continue nutritional supplementation with Ensure.  Mixed hyperlipidemia - Repeat LFTs stable.   -Statin resumed.    Type 2 diabetes mellitus with hyperglycemia ([name]) - Hemoglobin A1c 7.5 (08/30/2021 ). -CBG 28 of basic metabolic profile the morning of 09/02/2021. -Patient noted to have received Semglee while n.p.o., which has been discontinued. -CBGs have improved and CBG of 181 this morning. -Discontinued D5 normal saline. -Continue Semglee 10 units daily as patient currently postop. -SSI.  Anemia in chronic kidney disease (CKD) Hgb slowly trickling down currently at 6.6 this morning with repeat at 6.7. -Likely secondary to postop acute blood loss anemia. -Transfuse 2 units packed red blood cells. - Continue iron -Follow.  Iron deficiency anemia - Continue iron  Coronary atherosclerosis of native coronary artery - Continue aspirin, BB, statin.  Essential hypertension - Blood pressure controlled on current regimen of amlodipine, Coreg.   -Continue to hold home regimen clonidine.           DVT prophylaxis: Per orthopedics Code Status: Full Family Communication: Updated patient, no family at bedside Disposition: TBD  Status is: Inpatient Remains inpatient appropriate because: Severity of illness   Consultants:  Orthopedics: [name] 08/31/2021 Podiatry: [name] 08/29/2021 Nephrology: [name] 09/01/2021 Infectious disease: [name] 09/03/2021  Procedures:  Plain films of the left foot 09/25/2021 MRI left foot 08/29/2021 Renal ultrasound 09/01/2021 ABI bilateral 08/31/2021 Left BKA per orthopedics: [name] 09/02/2021 Irrigation debridement left foot, bone biopsy.  Podiatry: [name] 08/30/2021 Right jugular tunneled dual-lumen CVC line placement per IR, [name] 11/27/4780 Left AKA application of  wound VAC per [name] 09/04/2021. Transfusion 2 units packed red blood cells 09/07/2021   Antimicrobials:  Oral Flagyl 08/29/2021>>>> 09/05/2021 IV cefepime 08/29/2021>>>> 09/03/2021 IV vancomycin 08/29/2021>>> 09/03/2021 IV daptomycin 09/03/2021>>>> IV Ancef 09/03/2021>>>>>    Subjective: Sleeping but arousable.  Receiving transfusion.  No chest pain.  No shortness of breath.  No abdominal pain.  Tolerating current diet.  Patient denies any overt bleeding.  Objective: Vitals:   09/07/21 1247 09/07/21 1305 09/07/21 1526 09/07/21 1623  BP: (!) 151/75 (!) 162/82 (!) 154/89 (!) 142/81  Pulse: 84 80 78 79  Resp: '16 18 18 16  '$ Temp: 98.2 F (36.8 C) 97.9 F (36.6 C) 98.1 F (36.7 C) 98.1 F (36.7 C)  TempSrc: Oral Oral Oral Oral  SpO2: 100% 100% 100% 100%  Weight:      Height:        Intake/Output Summary (Last 24 hours) at 09/07/2021 1755 Last data filed at 09/07/2021 1705 Gross per 24 hour  Intake 1578.75 ml  Output 1700 ml  Net -121.25 ml   Filed Weights   08/29/21 1125 09/02/21 1206 09/04/21 1204  Weight: 87.5 kg 87.5 kg 88.5 kg    Examination:  General exam: NAD. Respiratory system: CTA B anterior lung fields.  No wheezes, no crackles, no rhonchi.  Fair air movement.  Speaking in full sentences. Cardiovascular system: RRR no murmurs rubs or gallops.  No JVD.   Gastrointestinal system: Abdomen is soft, nontender, nondistended, positive bowel sounds.  No rebound.  No guarding Central nervous system: Alert and oriented. No focal  neurological deficits. Extremities: Status post left AKA with wound VAC noted.  Status post right transmetatarsal amputation.  Skin: No rashes, lesions or ulcers Psychiatry: Judgement and insight appear normal. Mood & affect appropriate.     Data Reviewed:   CBC: Recent Labs  Lab 09/03/21 0149 09/04/21 0755 09/05/21 0142 09/06/21 0053 09/07/21 0505 09/07/21 0657  WBC 46.8* 39.1* 27.7* 24.2* 21.6*  --   NEUTROABS  --   --  25.5*  --  15.5*  --    HGB 10.1* 9.0* 8.5* 7.4* 6.6* 6.7*  HCT 28.7* 27.1* 26.2* 23.2* 20.3* 20.7*  MCV 78.6* 80.7 83.2 83.5 84.9  --   PLT 463* 458* 434* 422* 369  --     Basic Metabolic Panel: Recent Labs  Lab 09/01/21 0138 09/02/21 0212 09/03/21 0149 09/04/21 0755 09/05/21 0142 09/06/21 0053 09/07/21 0505  NA 136   < > 136 142 138 141 144  K 4.5   < > 4.9 4.1 5.0 4.2 4.5  CL 113*   < > 110 108 111 109 110  CO2 16*   < > 18* '22 22 26 27  '$ GLUCOSE 204*   < > 231* 112* 167* 191* 177*  BUN 113*   < > 112* 97* 85* 73* 61*  CREATININE 4.07*   < > 3.69* 3.36* 3.01* 2.93* 2.77*  CALCIUM 8.0*   < > 7.4* 7.9* 7.2* 7.7* 7.9*  MG 2.3  --   --   --   --   --   --   PHOS  --   --   --  3.8 3.9 3.1 2.8   < > = values in this interval not displayed.    GFR: Estimated Creatinine Clearance: 31.6 mL/min (A) (by C-G formula based on SCr of 2.77 mg/dL (H)).  Liver Function Tests: Recent Labs  Lab 09/01/21 0138 09/02/21 0277 09/03/21 0149 09/04/21 0755 09/05/21 0142 09/06/21 0053 09/07/21 0505  AST 32 28 42* 23  --   --   --   ALT 41 31 36 19  --   --   --   ALKPHOS 110 124 101 97  --   --   --   BILITOT 1.3* 1.2 1.0 0.7  --   --   --   PROT 5.9* 5.9* 5.7* 5.8*  --   --   --   ALBUMIN 1.6* 1.5* <1.5* <1.5*  <1.5* <1.5* 2.2* 2.8*    CBG: Recent Labs  Lab 09/06/21 1622 09/06/21 2132 09/07/21 0725 09/07/21 1209 09/07/21 1622  GLUCAP 207* 279* 181* 124* 161*     Recent Results (from the past 240 hour(s))  Blood culture (routine x 2)     Status: None   Collection Time: 08/29/21 12:13 PM   Specimen: BLOOD  Result Value Ref Range Status   Specimen Description   Final    BLOOD RIGHT ANTECUBITAL Performed at [facility], [place] [address], [place], [place] [address]    Special Requests   Final    BOTTLES DRAWN AEROBIC AND ANAEROBIC Blood Culture results may not be optimal due to an excessive volume of blood received in culture bottles Performed at [place] [address] [place], [place] [address]    Culture   Final    NO GROWTH 5 DAYS Performed at [facility], [place] [address] [place], [place] [address]    Report Status 09/03/2021 FINAL  Final  Aerobic/Anaerobic Culture w Gram Stain (surgical/deep wound)  Status: None   Collection Time: 08/29/21 12:35 PM   Specimen: Wound  Result Value Ref Range Status   Specimen Description   Final    WOUND LEFT FOOT Performed at [place] [address] [place], [place] [address]    Special Requests   Final    NONE Performed at [facility], [place] [address], [place], [place] [address]    Gram Stain   Final    NO SQUAMOUS EPITHELIAL CELLS SEEN NO WBC SEEN MODERATE GRAM NEGATIVE RODS MODERATE GRAM POSITIVE COCCI    Culture   Final    ABUNDANT PROTEUS MIRABILIS MODERATE ENTEROCOCCUS FAECALIS NO ANAEROBES ISOLATED Performed at [facility], [address] [place], [place] [address]    Report Status 09/03/2021 FINAL  Final   Organism ID, Bacteria PROTEUS MIRABILIS  Final   Organism ID, Bacteria ENTEROCOCCUS FAECALIS  Final      Susceptibility   Enterococcus faecalis - MIC*    AMPICILLIN <=2 SENSITIVE Sensitive     VANCOMYCIN 1 SENSITIVE Sensitive     GENTAMICIN SYNERGY SENSITIVE Sensitive     * MODERATE ENTEROCOCCUS FAECALIS   Proteus mirabilis - MIC*    AMPICILLIN <=2 SENSITIVE Sensitive     CEFAZOLIN <=4 SENSITIVE Sensitive     CEFEPIME <=0.12 SENSITIVE Sensitive     CEFTAZIDIME <=1 SENSITIVE Sensitive     CEFTRIAXONE <=0.25 SENSITIVE Sensitive     CIPROFLOXACIN <=0.25 SENSITIVE Sensitive     GENTAMICIN <=1 SENSITIVE Sensitive     IMIPENEM 2 SENSITIVE Sensitive     TRIMETH/SULFA <=20 SENSITIVE Sensitive     AMPICILLIN/SULBACTAM <=2 SENSITIVE Sensitive     PIP/TAZO <=4 SENSITIVE Sensitive     * ABUNDANT PROTEUS MIRABILIS  Blood culture (routine x 2)     Status: None   Collection Time: 08/29/21  6:10 PM    Specimen: BLOOD  Result Value Ref Range Status   Specimen Description   Final    BLOOD BLOOD RIGHT HAND Performed at [place] [address] [place], [place] [address]    Special Requests   Final    BOTTLES DRAWN AEROBIC ONLY Blood Culture results may not be optimal due to an inadequate volume of blood received in culture bottles Performed at [place] [address] [place], [place] [address]    Culture   Final    NO GROWTH 5 DAYS Performed at [facility], [place] [address], [place], [place] [address]    Report Status 09/04/2021 FINAL  Final  Aerobic/Anaerobic Culture w Gram Stain (surgical/deep wound)     Status: None   Collection Time: 08/30/21  9:53 AM   Specimen: Wound  Result Value Ref Range Status   Specimen Description   Final    WOUND LEFT FOOT WOUND Performed at [facility], [place] [address], [place], [place] [address]    Special Requests   Final    NONE Performed at [facility], [place] [address] [place], [place] [address]    Gram Stain   Final    RARE WBC PRESENT, PREDOMINANTLY PMN MODERATE GRAM POSITIVE COCCI IN PAIRS MODERATE GRAM VARIABLE ROD    Culture   Final    MODERATE PROTEUS MIRABILIS FEW ENTEROCOCCUS FAECALIS MODERATE BACTEROIDES DISTASONIS BETA LACTAMASE POSITIVE Performed at [facility], [place] [address], [place],  [address]    Report Status 09/07/2021 FINAL  Final   Organism ID, Bacteria PROTEUS  MIRABILIS  Final   Organism ID, Bacteria ENTEROCOCCUS FAECALIS  Final      Susceptibility   Enterococcus faecalis - MIC*    AMPICILLIN <=2 SENSITIVE Sensitive     VANCOMYCIN 1 SENSITIVE Sensitive     GENTAMICIN SYNERGY SENSITIVE Sensitive     * FEW ENTEROCOCCUS FAECALIS   Proteus mirabilis - MIC*    AMPICILLIN <=2 SENSITIVE Sensitive     CEFAZOLIN <=4 SENSITIVE Sensitive     CEFEPIME <=0.12 SENSITIVE Sensitive     CEFTAZIDIME <=1 SENSITIVE Sensitive      CEFTRIAXONE <=0.25 SENSITIVE Sensitive     CIPROFLOXACIN <=0.25 SENSITIVE Sensitive     GENTAMICIN <=1 SENSITIVE Sensitive     IMIPENEM 2 SENSITIVE Sensitive     TRIMETH/SULFA <=20 SENSITIVE Sensitive     AMPICILLIN/SULBACTAM <=2 SENSITIVE Sensitive     PIP/TAZO <=4 SENSITIVE Sensitive     * MODERATE PROTEUS MIRABILIS  Surgical PCR screen     Status: None   Collection Time: 09/02/21  5:30 AM   Specimen: Nasal Mucosa; Nasal Swab  Result Value Ref Range Status   MRSA, PCR NEGATIVE NEGATIVE Final   Staphylococcus aureus NEGATIVE NEGATIVE Final    Comment: (NOTE) The Xpert SA Assay (FDA approved for NASAL specimens in patients 69 years of age and older), is one component of a comprehensive surveillance program. It is not intended to diagnose infection nor to guide or monitor treatment. Performed at [facility], [place] [address] [place], [place] [address]   Aerobic/Anaerobic Culture w Gram Stain (surgical/deep wound)     Status: None   Collection Time: 09/02/21  3:07 PM   Specimen: Soft Tissue, Other  Result Value Ref Range Status   Specimen Description WOUND  Final   Special Requests LEFT LEG PUS  Final   Gram Stain   Final    MODERATE WBC PRESENT,BOTH PMN AND MONONUCLEAR FEW GRAM NEGATIVE RODS FEW GRAM POSITIVE COCCI IN PAIRS FEW GRAM POSITIVE COCCI IN CLUSTERS Performed at [facility], [place] [address] [place],  [address]    Culture   Final    ABUNDANT PROTEUS MIRABILIS ABUNDANT ENTEROCOCCUS FAECALIS MIXED ANAEROBIC FLORA PRESENT.  CALL LAB IF FURTHER IID REQUIRED.    Report Status 09/07/2021 FINAL  Final   Organism ID, Bacteria PROTEUS MIRABILIS  Final   Organism ID, Bacteria ENTEROCOCCUS FAECALIS  Final      Susceptibility   Enterococcus faecalis - MIC*    AMPICILLIN <=2 SENSITIVE Sensitive     VANCOMYCIN 1 SENSITIVE Sensitive     GENTAMICIN SYNERGY SENSITIVE Sensitive     * ABUNDANT ENTEROCOCCUS FAECALIS   Proteus mirabilis - MIC*    AMPICILLIN  <=2 SENSITIVE Sensitive     CEFAZOLIN <=4 SENSITIVE Sensitive     CEFEPIME <=0.12 SENSITIVE Sensitive     CEFTAZIDIME <=1 SENSITIVE Sensitive     CEFTRIAXONE <=0.25 SENSITIVE Sensitive     CIPROFLOXACIN <=0.25 SENSITIVE Sensitive     GENTAMICIN <=1 SENSITIVE Sensitive     IMIPENEM 2 SENSITIVE Sensitive     TRIMETH/SULFA <=20 SENSITIVE Sensitive     AMPICILLIN/SULBACTAM <=2 SENSITIVE Sensitive     PIP/TAZO <=4 SENSITIVE Sensitive     * ABUNDANT PROTEUS MIRABILIS         Radiology Studies: No results found.      Scheduled Meds:  amLODipine  10 mg Oral Daily   vitamin C  1,000 mg Oral Daily   aspirin EC  81 mg Oral Daily  atorvastatin  20 mg Oral Daily   carvedilol  25 mg Oral BID WC   Chlorhexidine Gluconate Cloth  6 each Topical Daily   docusate sodium  100 mg Oral Daily   feeding supplement  237 mL Oral TID BM   ferrous sulfate  325 mg Oral Q breakfast   gabapentin  300 mg Oral QHS   insulin aspart  0-15 Units Subcutaneous TID WC   insulin glargine-yfgn  10 Units Subcutaneous Daily   multivitamin with minerals  1 tablet Oral Daily   nutrition supplement (JUVEN)  1 packet Oral BID BM   pantoprazole  40 mg Oral Daily   zinc sulfate  220 mg Oral Daily   Continuous Infusions:   ceFAZolin (ANCEF) IV 2 g (09/07/21 1703)   DAPTOmycin (CUBICIN) 550 mg in sodium chloride 0.9 % IVPB Stopped (09/05/21 2148)   magnesium sulfate bolus IVPB       LOS: 9 days    Time spent: 35 minutes    Irine Seal, MD [facility]   To contact the attending provider between 7A-7P or the covering provider during after hours 7P-7A, please log into the web site [email or web address] and access using universal [place] password for that web site. If you do not have the password, please call the hospital operator.  09/07/2021, 5:55 PM

## 2021-09-07 NOTE — TOC Progression Note (Signed)
Transition of Care [facility]) - Progression Note    Patient Details  Name: [name] MRN: [id number] Date of Birth: [birth year]  Transition of Care [facility]) CM/SW [place], [place] Phone Number: 09/07/2021, 3:49 PM  Clinical Narrative:    CSW followed up with pt to update him that [facility] remains the only facility who has offered he stated "Ive got to do what ive got to do". He agreed to CSW following up with his siblings as he said they were looking at the facility. CSW spoke with the brother who said they would likely want [facility], but he would talk to family to make sure and call CSW back. TOC will continue to follow.   Expected Discharge Plan: [place] Barriers to Discharge: Continued Medical Work up  Expected Discharge Plan and Services Expected Discharge Plan: [place] In-house Referral: Clinical Social Work   Post Acute Care Choice: [place]                                         Social Determinants of Health (SDOH) Interventions    Readmission Risk Interventions     No data to display

## 2021-09-07 NOTE — Progress Notes (Addendum)
Nutrition Follow-up  DOCUMENTATION CODES:  Obesity unspecified  INTERVENTION:  Encourage adequate PO intake Adjust diet back to carb modified to assist with intake, changed to ordering assist Ensure Enlive po TID, each supplement provides 350 kcal and 20 grams of protein. (Strawberry and vanilla) 1 packet Juven BID, each packet provides 95 calories, 2.5 grams of protein (collagen), and 9.8 grams of carbohydrate (3 grams sugar); + vitamins and minerals to support wound healing MVI with minerals daily  NUTRITION DIAGNOSIS:  Increased nutrient needs related to acute illness, wound healing as evidenced by estimated needs. - remains applicable  GOAL:  Patient will meet greater than or equal to 90% of their needs - progressing, supplements in place  MONITOR:  PO intake, Supplement acceptance, Labs, Weight trends, Diet advancement  REASON FOR ASSESSMENT:  Consult Wound healing  ASSESSMENT:  Pt admitted d/t fall. Found to have diabetic ulcer/acute osteomyelitis of L foot d/t T2DM. PMH significant for seasonal allergies, iron deficiency anemia, CKD stage 3B, CAD, constipation, T2DM, glaucoma, HLD, HTN, diabetic peripheral neuropathy, diabetic retinopathy, tobacco use, multiple lower extremity I&D and amputations.  6/7 - Op, left BKA 6/9 - Placement of right IJ for long term antibiotics 6/9 - Op, left AKA with wound vac application  Pt resting in bedside chair at the time of assessment. Lunch tray mostly uneaten at bedside. Pt endorses a continued poor appetite. States he doesn't like the food he is getting. Pt reports that he is being sent a tray, he doesn't know what to order since his diet keeps being adjusted.   Reviewed the importance of nutrition in healing from surgery and recovering. Discussed the importance of protein and noted that he was not meeting his needs. Will adjust order back to carb modified as it is currently back to renal. K is not high and intake is poor. Pt also  agrees that ordering assist from dining services will help with intake. Pt is routinely consuming ensure. Likes the chocolate flavor.   Nutritionally Relevant Medications: Scheduled Meds:  vitamin C  1,000 mg Oral Daily   atorvastatin  20 mg Oral Daily   docusate sodium  100 mg Oral Daily   Ensure Enlive  237 mL Oral BID BM   ferrous sulfate  325 mg Oral Q breakfast   gabapentin  300 mg Oral QHS   insulin aspart  0-15 Units Subcutaneous TID WC   insulin glargine-yfgn  10 Units Subcutaneous Daily   multivitamin with minerals  1 tablet Oral Daily   JUVEN  1 packet Oral BID BM   pantoprazole  40 mg Oral Daily   zinc sulfate  220 mg Oral Daily   Continuous Infusions:  albumin human 25 g (09/06/21 2106)    ceFAZolin (ANCEF) IV 2 g (09/07/21 0908)   magnesium sulfate bolus IVPB     PRN Meds: alum & mag hydroxide-simeth, bisacodyl, magnesium sulfate bolus IVPB, ondansetron, polyethylene glycol, potassium chloride, senna  Labs Reviewed: BUN 61, creatinine 2.77  NUTRITION - FOCUSED PHYSICAL EXAM: Flowsheet Row Most Recent Value  Orbital Region No depletion  Upper Arm Region No depletion  Thoracic and Lumbar Region No depletion  Buccal Region No depletion  Temple Region No depletion  Clavicle Bone Region No depletion  Clavicle and Acromion Bone Region No depletion  Scapular Bone Region No depletion  Dorsal Hand No depletion  Patellar Region No depletion  Anterior Thigh Region No depletion  Posterior Calf Region No depletion  Edema (RD Assessment) None  Hair Reviewed  Eyes  Reviewed  Mouth Reviewed  Skin Reviewed  Nails Reviewed   Diet Order:   Diet Order             Diet Carb Modified Fluid consistency: Thin; Room service appropriate? Yes with Assist  Diet effective now                   EDUCATION NEEDS:  Education needs have been addressed  Skin:  Skin Assessment: Skin Integrity Issues: Skin Integrity Issues:: Diabetic Ulcer, Incisions Diabetic Ulcer: L foot  (open/dehisced) Incisions: L foot (closed)  Last BM:  6/3  Height:  Ht Readings from Last 1 Encounters:  09/04/21 '5\' 4"'$  (1.626 m)   Weight:  Wt Readings from Last 1 Encounters:  09/04/21 88.5 kg   Ideal Body Weight:  59.1 kg  BMI:  Body mass index is 33.47 kg/m.  Estimated Nutritional Needs:  Kcal:  1800-2000 Protein:  90-105g Fluid:  >/=1.8L   Ranell Patrick, RD, LDN Clinical Dietitian RD pager # available in [facility]  After hours/weekend pager # available in [facility]

## 2021-09-07 NOTE — Progress Notes (Signed)
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 PMH: allergies, iron deficiency anemia, 3B stage CKD, CAD, constipation, type II DM, glaucoma, hyperlipidemia, hypertension, diabetic peripheral neuropathy, diabetic retinopathy, tobacco use, multiple lower extremities I&D and amputations    PT Comments    Pt appears motivated to engage with therapy this session. Pt educated on importance of hip extension and prone lying in order to work towards getting a prosthetic in the future. Pt educated on proper set up for scoot transfer to chair. Requiring minA +2 assistance for safety but demonstrating good progress toward independence. Pt with complains of lightheadedness throughout session, supine BP measured at 151/79 (100) and 154/88 (109) upon sitting to EOB. SNF remains an appropriate discharge disposition for this pt in order to continue progressing transfer quality and functional independence. Pt with questions about purpose of going to SNF for rehab after discharge, educated on importance of independence with transfers and continuing to progress towards receiving prosthetic. Pt verbalized understanding and is agreeable to discharge plan.    Recommendations for follow up therapy are one component of a multi-disciplinary discharge planning process, led by the attending physician.  Recommendations may be updated based on patient status, additional functional criteria and insurance authorization.  Follow Up Recommendations  Skilled nursing-short term rehab (<3 hours/day) (insurance will not approve AIR)     Assistance Recommended at Discharge Frequent or constant Supervision/Assistance  Patient can return home with the  following A lot of help with walking and/or transfers;A lot of help with bathing/dressing/bathroom;Assistance with cooking/housework;Direct supervision/assist for medications management;Assist for transportation;Help with stairs or ramp for entrance   Equipment Recommendations  Other (comment) (tub bench)    Recommendations for Other Services       Precautions / Restrictions Precautions Precautions: Fall Precaution Comments: wound vac L LE, previous R foot amputation Restrictions Weight Bearing Restrictions: Yes LLE Weight Bearing: Non weight bearing     Mobility  Bed Mobility Overal bed mobility: Needs Assistance Bed Mobility: Supine to Sit     Supine to sit: Mod assist, +2 for physical assistance     General bed mobility comments: bed flat, use of bed rails, bed pad and trunk assist to achieve sitting    Transfers Overall transfer level: Needs assistance Equipment used: None Transfers: Bed to chair/wheelchair/BSC            Lateral/Scoot Transfers: Min assist, +2 physical assistance General transfer comment: Cues for aiming hips to back of the chair to prevent sliding off bed. Pt with adequate UE strength to unweight hips to scoot. Bed pad assist to scoot to R to chair    Ambulation/Gait                   Stairs             Wheelchair Mobility    Modified Rankin (Stroke Patients Only)       Balance Overall balance assessment: Needs assistance Sitting-balance support: Bilateral upper extremity supported Sitting balance-Leahy Scale: Fair Sitting balance - Comments: adequate static sitting balance, mild LOB when donning gait belt  Cognition Arousal/Alertness: Awake/alert Behavior During Therapy: WFL for tasks assessed/performed Overall Cognitive Status: Within Functional Limits for tasks assessed                                          Exercises      General Comments         Pertinent Vitals/Pain Pain Assessment Pain Assessment: Faces Faces Pain Scale: Hurts even more Pain Location: L residual limb Pain Descriptors / Indicators: Discomfort, Grimacing, Operative site guarding Pain Intervention(s): Limited activity within patient's tolerance, Monitored during session    Home Living                          Prior Function            PT Goals (current goals can now be found in the care plan section) Acute Rehab PT Goals Patient Stated Goal: to go home PT Goal Formulation: With patient Time For Goal Achievement: 09/17/21 Potential to Achieve Goals: Good Progress towards PT goals: Progressing toward goals    Frequency    Min 3X/week      PT Plan Frequency needs to be updated    Co-evaluation              AM-PAC PT "6 Clicks" Mobility   Outcome Measure  Help needed turning from your back to your side while in a flat bed without using bedrails?: A Little Help needed moving from lying on your back to sitting on the side of a flat bed without using bedrails?: A Lot Help needed moving to and from a bed to a chair (including a wheelchair)?: A Lot Help needed standing up from a chair using your arms (e.g., wheelchair or bedside chair)?: Total Help needed to walk in hospital room?: Total Help needed climbing 3-5 steps with a railing? : Total 6 Click Score: 10    End of Session Equipment Utilized During Treatment: Gait belt Activity Tolerance: Patient limited by pain Patient left: in chair;with call bell/phone within reach;with chair alarm set Nurse Communication: Mobility status PT Visit Diagnosis: Unsteadiness on feet (R26.81);Muscle weakness (generalized) (M62.81);Difficulty in walking, not elsewhere classified (R26.2);Pain Pain - Right/Left: Left Pain - part of body: Leg     Time: 1342-1411 PT Time Calculation (min) (ACUTE ONLY): 29 min  Charges:  $Therapeutic Activity: 23-37 mins                     Mackie Pai, SPT Acute Rehabilitation Services  Office: [phone]    Mackie Pai 09/07/2021, 3:14 PM

## 2021-09-08 DIAGNOSIS — E11621 Type 2 diabetes mellitus with foot ulcer: Secondary | ICD-10-CM | POA: Diagnosis not present

## 2021-09-08 DIAGNOSIS — N179 Acute kidney failure, unspecified: Secondary | ICD-10-CM | POA: Diagnosis not present

## 2021-09-08 DIAGNOSIS — N184 Chronic kidney disease, stage 4 (severe): Secondary | ICD-10-CM | POA: Diagnosis not present

## 2021-09-08 DIAGNOSIS — M869 Osteomyelitis, unspecified: Secondary | ICD-10-CM | POA: Diagnosis not present

## 2021-09-08 LAB — TYPE AND SCREEN
ABO/RH(D): O POS
ABO/RH(D): O POS
Antibody Screen: NEGATIVE
Antibody Screen: NEGATIVE
Unit division: 0
Unit division: 0

## 2021-09-08 LAB — RENAL FUNCTION PANEL
Albumin: 3.1 g/dL — ABNORMAL LOW (ref 3.5–5.0)
Anion gap: 7 (ref 5–15)
BUN: 55 mg/dL — ABNORMAL HIGH (ref 6–20)
CO2: 27 mmol/L (ref 22–32)
Calcium: 8.5 mg/dL — ABNORMAL LOW (ref 8.9–10.3)
Chloride: 106 mmol/L (ref 98–111)
Creatinine, Ser: 2.65 mg/dL — ABNORMAL HIGH (ref 0.61–1.24)
GFR, Estimated: 28 mL/min — ABNORMAL LOW (ref >60)
Glucose, Bld: 140 mg/dL — ABNORMAL HIGH (ref 70–99)
Phosphorus: 2.6 mg/dL (ref 2.5–4.6)
Potassium: 4.2 mmol/L (ref 3.5–5.1)
Sodium: 140 mmol/L (ref 135–145)

## 2021-09-08 LAB — GLUCOSE, CAPILLARY
Glucose-Capillary: 155 mg/dL — ABNORMAL HIGH (ref 70–99)
Glucose-Capillary: 187 mg/dL — ABNORMAL HIGH (ref 70–99)
Glucose-Capillary: 134 mg/dL — ABNORMAL HIGH (ref 70–99)
Glucose-Capillary: 145 mg/dL — ABNORMAL HIGH (ref 70–99)

## 2021-09-08 LAB — CBC WITH DIFFERENTIAL/PLATELET
Abs Immature Granulocytes: 0.44 10*3/uL — ABNORMAL HIGH (ref 0.00–0.07)
Basophils Absolute: 0 10*3/uL (ref 0.0–0.1)
Basophils Relative: 0 %
Eosinophils Absolute: 0.2 10*3/uL (ref 0.0–0.5)
Eosinophils Relative: 1 %
HCT: 28.8 % — ABNORMAL LOW (ref 39.0–52.0)
Hemoglobin: 9.3 g/dL — ABNORMAL LOW (ref 13.0–17.0)
Immature Granulocytes: 2 %
Lymphocytes Relative: 14 %
Lymphs Abs: 3.4 10*3/uL (ref 0.7–4.0)
MCH: 27.8 pg (ref 26.0–34.0)
MCHC: 32.3 g/dL (ref 30.0–36.0)
MCV: 86 fL (ref 80.0–100.0)
Monocytes Absolute: 0.9 10*3/uL (ref 0.1–1.0)
Monocytes Relative: 4 %
Neutro Abs: 18.6 10*3/uL — ABNORMAL HIGH (ref 1.7–7.7)
Neutrophils Relative %: 79 %
Platelets: 346 10*3/uL (ref 150–400)
RBC: 3.35 MIL/uL — ABNORMAL LOW (ref 4.22–5.81)
RDW: 15.6 % — ABNORMAL HIGH (ref 11.5–15.5)
WBC: 23.7 10*3/uL — ABNORMAL HIGH (ref 4.0–10.5)
nRBC: 0 % (ref 0.0–0.2)

## 2021-09-08 LAB — BPAM RBC
Blood Product Expiration Date: 202307142359
Blood Product Expiration Date: 202307142359
ISSUE DATE / TIME: 202306120916
ISSUE DATE / TIME: 202306121240
Unit Type and Rh: 5100
Unit Type and Rh: 5100

## 2021-09-08 MED ORDER — HYDRALAZINE HCL 25 MG PO TABS
25.0000 mg | ORAL_TABLET | Freq: Two times a day (BID) | ORAL | Status: DC
  Administered 2021-09-08 – 2021-09-09 (×3): 25 mg via ORAL
  Filled 2021-09-08 (×3): qty 1

## 2021-09-08 MED ORDER — HYDRALAZINE HCL 25 MG PO TABS: 25.0000 mg | ORAL_TABLET | Freq: Three times a day (TID) | ORAL | Status: DC

## 2021-09-08 NOTE — Progress Notes (Signed)
PROGRESS NOTE    [name]  DJM:[id number] DOB: Mar 06, 1971 DOA: 08/29/2021 PCP: Camillia Herter, NP    Chief Complaint  Patient presents with   Fall    Brief Narrative:  [name] is a 51 y.o. 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  He was evaluated by surgery who recommended transtibial amputation.   6/3: Admitted, Cr 5.2 on admission (from baseline 2.5); Podiatry evaluated, MRI showed osteo, no salvage operations were judged possible 6/5: Transferred to [facility], ABIs done, inconclusive    Assessment & Plan:  Principal Problem:   Diabetic ulcer of left foot associated with type 2 diabetes mellitus ([place]) Active Problems:   Osteomyelitis of foot, acute ([place])   Acute kidney injury superimposed on CKD ([place])   Essential hypertension   Coronary atherosclerosis of native coronary artery   Anemia in chronic kidney disease (CKD)   Type 2 diabetes mellitus with hyperglycemia ([place])   Mixed hyperlipidemia   Severe protein-calorie malnutrition ([name])   Transaminitis   CKD (chronic kidney disease), stage IV ([name])   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) - 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  -Op note with findings of purulent abscess that extended up to the popliteal fossa with necrotic muscle along the medial head of the gastrocnemius muscle and medial soleus.   -ID consulted and recommended discontinuation of IV vancomycin and IV cefepime and placing patient on daptomycin plus cefazolin.  -ID initially was recommending at least 4 weeks of IV antibiotics and recommending to  check a sed rate and CRP. -Due to renal function unable to place a PICC at this time, nephrology recommending tunneled central line for prolonged antibiotics which was placed 09/04/2021 per IR. -Patient taken back to the OR per orthopedics, Dr. Sharol Given on 09/04/2021 and patient noted to have purulence draining from below the knee amputation and as such orthopedics proceeded with left AKA. -Per orthopedics clear margins noted. -ID following and recommending to continue on daptomycin every other day and cefazolin for the time being until white count normalizes over the next 7 days then can discontinue IV tunneled line.  - Continue nutritional supplements, vitamins -ID and orthopedics following and appreciate input and recommendations.  Acute kidney injury superimposed on CKD [facility]) Renal function 5.2 on admission, up from basleine around 2.5-2.8 since Jan. Here, was initially improving, but in last day, despite bicarb fluids, trending back up >4 mg/dL, in setting of vancomycin use. -Patient also noted to be taking NSAIDs prior to admission. - Patient seen in consultation by nephrology and patient placed on a bicarb drip.  -Renal function slowly improved and seems to be stabilizing currently at 2.77 today.   -Bicarb drip has been discontinued. -Nephrology was following but have signed off on 09/05/2021 with recommendations for outpatient follow-up with Dr. [name] after discharge. -Avoid nephrotoxic agents.    Acute postoperative anemia due to expected blood loss - Postoperatively hemoglobin has trended down with hemoglobin at 6.6/6.7 the morning of 09/07/2021 and transfused 2 units packed red blood cells with hemoglobin at 9.3.Marland Kitchen -Patient with no overt bleeding. -  Repeat labs in the a.m.  CKD (chronic kidney disease), stage IV ([place]) Cr seems to have worsened since about Jan when he had endocarditis. Now 2.5-2.8 baseline. - Nephrology was following.  Transaminitis -Total bilirubin has improved currently  down to 0.7 -Follow.  Severe protein-calorie malnutrition ([name]) - Albumin was at  < 1.5. -IV albumin every 6 hours x24 to 48 hours was given, albumin currently at 3.1 today. -continue nutritional supplementation with Ensure.  Mixed hyperlipidemia - Repeat LFTs stable.   -Statin resumed.    Type 2 diabetes mellitus with hyperglycemia ([name]) - Hemoglobin A1c 7.5 (08/30/2021 ). -CBG 28 of basic metabolic profile the morning of 09/02/2021. -Patient noted to have received Semglee while n.p.o., which has been discontinued. -CBGs have improved and CBG of 155 this morning. -Discontinued D5 normal saline. -Continue Semglee 10 units daily as patient currently postop. -SSI.  Anemia in chronic kidney disease (CKD) Hgb trickled down to approximately 6.6 on 09/07/2021.   -Patient transfused 2 units packed red blood cells with hemoglobin currently at 9.3.   -Likely secondary to postop acute blood loss anemia. -Transfuse 2 units packed red blood cells. - Continue iron -Follow.  Iron deficiency anemia - Continue iron  Coronary atherosclerosis of native coronary artery - Continue aspirin, BB, statin.  Essential hypertension - Blood pressure controlled on current regimen of amlodipine, Coreg.   -Continue to hold home regimen clonidine.   -Place on hydralazine 25 mg twice daily and uptitrate for better blood pressure control.         DVT prophylaxis: Per orthopedics Code Status: Full Family Communication: Updated patient, no family at bedside Disposition: TBD  Status is: Inpatient Remains inpatient appropriate because: Severity of illness   Consultants:  Orthopedics: Dr. Sharol Given 08/31/2021 Podiatry: Dr. Jacqualyn Posey 08/29/2021 Nephrology: Dr. Marval Regal 09/01/2021 Infectious disease: Dr. Baxter Flattery 09/03/2021  Procedures:  Plain films of the left foot 09/25/2021 MRI left foot 08/29/2021 Renal ultrasound 09/01/2021 ABI bilateral 08/31/2021 Left BKA per orthopedics: Dr. Sharol Given 09/02/2021 Irrigation  debridement left foot, bone biopsy.  Podiatry: Dr. Jacqualyn Posey 08/30/2021 Right jugular tunneled dual-lumen CVC line placement per IR, Dr. Pascal Lux 11/28/1192 Left AKA application of wound VAC per Dr. Sharol Given 09/04/2021. Transfusion 2 units packed red blood cells 09/07/2021   Antimicrobials:  Oral Flagyl 08/29/2021>>>> 09/05/2021 IV cefepime 08/29/2021>>>> 09/03/2021 IV vancomycin 08/29/2021>>> 09/03/2021 IV daptomycin 09/03/2021>>>> IV Ancef 09/03/2021>>>>>    Subjective: Patient sleeping but arousable.  No chest pain.  No shortness of breath.  No abdominal pain.  Tolerating diet.   Objective: Vitals:   09/07/21 2138 09/08/21 0440 09/08/21 0450 09/08/21 0826  BP: (!) 153/89 (!) 169/90 (!) 159/86 (!) 175/94  Pulse: 78 79 79 83  Resp: '17 17 18 19  '$ Temp: 98.2 F (36.8 C) 97.8 F (36.6 C) 97.9 F (36.6 C) (!) 97.5 F (36.4 C)  TempSrc: Oral Oral Oral Oral  SpO2: 100% 100% 94% 100%  Weight:      Height:        Intake/Output Summary (Last 24 hours) at 09/08/2021 1751 Last data filed at 09/08/2021 0311 Gross per 24 hour  Intake 990.92 ml  Output 1500 ml  Net -509.08 ml   Filed Weights   08/29/21 1125 09/02/21 1206 09/04/21 1204  Weight: 87.5 kg 87.5 kg 88.5 kg    Examination:  General exam: NAD. Respiratory system: Lungs clear to auscultation bilaterally.  No wheezes, no crackles, no rhonchi.  Fair air movement. Cardiovascular system: Regular rate rhythm no murmurs rubs or gallops.  No JVD.  Gastrointestinal system: Abdomen is soft, nontender, nondistended, positive bowel sounds.  No rebound.  No guarding Central nervous system: Alert and oriented. No focal neurological deficits. Extremities: Status post left AKA with wound VAC noted.  Status post right transmetatarsal amputation.  Skin: No rashes, lesions or ulcers Psychiatry: Judgement and insight appear normal. Mood & affect appropriate.     Data Reviewed:   CBC: Recent Labs  Lab 09/04/21 0755 09/05/21 0142 09/06/21 0053 09/07/21 0505  09/07/21 0657 09/07/21 1820 09/08/21 0436  WBC 39.1* 27.7* 24.2* 21.6*  --   --  23.7*  NEUTROABS  --  25.5*  --  15.5*  --   --  18.6*  HGB 9.0* 8.5* 7.4* 6.6* 6.7* 9.6* 9.3*  HCT 27.1* 26.2* 23.2* 20.3* 20.7* 29.2* 28.8*  MCV 80.7 83.2 83.5 84.9  --   --  86.0  PLT 458* 434* 422* 369  --   --  562    Basic Metabolic Panel: Recent Labs  Lab 09/04/21 0755 09/05/21 0142 09/06/21 0053 09/07/21 0505 09/08/21 0436  NA 142 138 141 144 140  K 4.1 5.0 4.2 4.5 4.2  CL 108 111 109 110 106  CO2 '22 22 26 27 27  '$ GLUCOSE 112* 167* 191* 177* 140*  BUN 97* 85* 73* 61* 55*  CREATININE 3.36* 3.01* 2.93* 2.77* 2.65*  CALCIUM 7.9* 7.2* 7.7* 7.9* 8.5*  PHOS 3.8 3.9 3.1 2.8 2.6    GFR: Estimated Creatinine Clearance: 33.1 mL/min (A) (by C-G formula based on SCr of 2.65 mg/dL (H)).  Liver Function Tests: Recent Labs  Lab 09/02/21 0212 09/03/21 0149 09/04/21 0755 09/05/21 0142 09/06/21 0053 09/07/21 0505 09/08/21 0436  AST 28 42* 23  --   --   --   --   ALT 31 36 19  --   --   --   --   ALKPHOS 124 101 97  --   --   --   --   BILITOT 1.2 1.0 0.7  --   --   --   --   PROT 5.9* 5.7* 5.8*  --   --   --   --   ALBUMIN 1.5* <1.5* <1.5*  <1.5* <1.5* 2.2* 2.8* 3.1*    CBG: Recent Labs  Lab 09/07/21 1622 09/07/21 2130 09/08/21 0827 09/08/21 1127 09/08/21 1643  GLUCAP 161* 159* 155* 187* 134*     Recent Results (from the past 240 hour(s))  Blood culture (routine x 2)     Status: None   Collection Time: 08/29/21  6:10 PM   Specimen: BLOOD  Result Value Ref Range Status   Specimen Description   Final    BLOOD BLOOD RIGHT HAND Performed at [facility], [place] [address], [place], [place] [address]    Special Requests   Final    BOTTLES DRAWN AEROBIC ONLY Blood Culture results may not be optimal due to an inadequate volume of blood received in culture bottles Performed at [place] [address] [place], [place] [address]    Culture    Final    NO GROWTH 5 DAYS Performed at [facility], [place] [address], [place], [place] [address]    Report Status 09/04/2021 FINAL  Final  Aerobic/Anaerobic Culture w Gram Stain (surgical/deep wound)     Status: None   Collection Time: 08/30/21  9:53 AM   Specimen: Wound  Result Value Ref Range Status   Specimen Description   Final    WOUND  LEFT FOOT WOUND Performed at [facility], [place] [address], [place], [place] [address]    Special Requests   Final    NONE Performed at [facility], [place] [address], [place], [place] [address]    Gram Stain   Final    RARE WBC PRESENT, PREDOMINANTLY PMN MODERATE GRAM POSITIVE COCCI IN PAIRS MODERATE GRAM VARIABLE ROD    Culture   Final    MODERATE PROTEUS MIRABILIS FEW ENTEROCOCCUS FAECALIS MODERATE BACTEROIDES DISTASONIS BETA LACTAMASE POSITIVE Performed at [facility], [place] [address], [place], [place] [address]    Report Status 09/07/2021 FINAL  Final   Organism ID, Bacteria PROTEUS MIRABILIS  Final   Organism ID, Bacteria ENTEROCOCCUS FAECALIS  Final      Susceptibility   Enterococcus faecalis - MIC*    AMPICILLIN <=2 SENSITIVE Sensitive     VANCOMYCIN 1 SENSITIVE Sensitive     GENTAMICIN SYNERGY SENSITIVE Sensitive     * FEW ENTEROCOCCUS FAECALIS   Proteus mirabilis - MIC*    AMPICILLIN <=2 SENSITIVE Sensitive     CEFAZOLIN <=4 SENSITIVE Sensitive     CEFEPIME <=0.12 SENSITIVE Sensitive     CEFTAZIDIME <=1 SENSITIVE Sensitive     CEFTRIAXONE <=0.25 SENSITIVE Sensitive     CIPROFLOXACIN <=0.25 SENSITIVE Sensitive     GENTAMICIN <=1 SENSITIVE Sensitive     IMIPENEM 2 SENSITIVE Sensitive     TRIMETH/SULFA <=20 SENSITIVE Sensitive     AMPICILLIN/SULBACTAM <=2 SENSITIVE Sensitive     PIP/TAZO <=4 SENSITIVE Sensitive     * MODERATE PROTEUS MIRABILIS  Surgical PCR screen     Status: None   Collection Time: 09/02/21  5:30 AM   Specimen: Nasal Mucosa; Nasal Swab  Result Value Ref Range Status    MRSA, PCR NEGATIVE NEGATIVE Final   Staphylococcus aureus NEGATIVE NEGATIVE Final    Comment: (NOTE) The Xpert SA Assay (FDA approved for NASAL specimens in patients 8 years of age and older), is one component of a comprehensive surveillance program. It is not intended to diagnose infection nor to guide or monitor treatment. Performed at [facility], [place] [address] [place], [place] [address]   Aerobic/Anaerobic Culture w Gram Stain (surgical/deep wound)     Status: None   Collection Time: 09/02/21  3:07 PM   Specimen: Soft Tissue, Other  Result Value Ref Range Status   Specimen Description WOUND  Final   Special Requests LEFT LEG PUS  Final   Gram Stain   Final    MODERATE WBC PRESENT,BOTH PMN AND MONONUCLEAR FEW GRAM NEGATIVE RODS FEW GRAM POSITIVE COCCI IN PAIRS FEW GRAM POSITIVE COCCI IN CLUSTERS Performed at [facility], [place] [address], [place], [place] [address]    Culture   Final    ABUNDANT PROTEUS MIRABILIS ABUNDANT ENTEROCOCCUS FAECALIS MIXED ANAEROBIC FLORA PRESENT.  CALL LAB IF FURTHER IID REQUIRED.    Report Status 09/07/2021 FINAL  Final   Organism ID, Bacteria PROTEUS MIRABILIS  Final   Organism ID, Bacteria ENTEROCOCCUS FAECALIS  Final      Susceptibility   Enterococcus faecalis - MIC*    AMPICILLIN <=2 SENSITIVE Sensitive     VANCOMYCIN 1 SENSITIVE Sensitive     GENTAMICIN SYNERGY SENSITIVE Sensitive     * ABUNDANT ENTEROCOCCUS FAECALIS   Proteus mirabilis - MIC*    AMPICILLIN <=2 SENSITIVE Sensitive     CEFAZOLIN <=4 SENSITIVE Sensitive     CEFEPIME <=0.12 SENSITIVE Sensitive  CEFTAZIDIME <=1 SENSITIVE Sensitive     CEFTRIAXONE <=0.25 SENSITIVE Sensitive     CIPROFLOXACIN <=0.25 SENSITIVE Sensitive     GENTAMICIN <=1 SENSITIVE Sensitive     IMIPENEM 2 SENSITIVE Sensitive     TRIMETH/SULFA <=20 SENSITIVE Sensitive     AMPICILLIN/SULBACTAM <=2 SENSITIVE Sensitive     PIP/TAZO <=4 SENSITIVE Sensitive     * ABUNDANT PROTEUS MIRABILIS          Radiology Studies: No results found.      Scheduled Meds:  amLODipine  10 mg Oral Daily   vitamin C  1,000 mg Oral Daily   aspirin EC  81 mg Oral Daily   atorvastatin  20 mg Oral Daily   carvedilol  25 mg Oral BID WC   Chlorhexidine Gluconate Cloth  6 each Topical Daily   docusate sodium  100 mg Oral Daily   feeding supplement  237 mL Oral TID BM   ferrous sulfate  325 mg Oral Q breakfast   gabapentin  300 mg Oral QHS   hydrALAZINE  25 mg Oral BID   insulin aspart  0-15 Units Subcutaneous TID WC   insulin glargine-yfgn  10 Units Subcutaneous Daily   multivitamin with minerals  1 tablet Oral Daily   nutrition supplement (JUVEN)  1 packet Oral BID BM   pantoprazole  40 mg Oral Daily   zinc sulfate  220 mg Oral Daily   Continuous Infusions:   ceFAZolin (ANCEF) IV 2 g (09/08/21 1725)   DAPTOmycin (CUBICIN) 550 mg in sodium chloride 0.9 % IVPB 550 mg (09/07/21 2150)   magnesium sulfate bolus IVPB       LOS: 10 days    Time spent: 35 minutes    Irine Seal, MD [facility]   To contact the attending provider between 7A-7P or the covering provider during after hours 7P-7A, please log into the web site [email or web address] and access using universal [place] password for that web site. If you do not have the password, please call the hospital operator.  09/08/2021, 5:51 PM

## 2021-09-08 NOTE — Progress Notes (Signed)
Mobility Specialist Progress Note:   09/08/21 1030  Mobility  Activity Transferred from chair to bed  Level of Assistance Maximum assist, patient does 25-49%  Assistive Device Other (Comment) (chuck pads)  Activity Response Tolerated well  $Mobility charge 1 Mobility   Pt requesting to transfer to bed as he has been in chair since yesterday. Required maxA+2 (for safety) to slide to bed with use of chuck pads for assistance. Pt left with all needs met.   [name] Acute Rehab Secure Chat or Office Phone: [phone]

## 2021-09-09 ENCOUNTER — Telehealth: Payer: Self-pay

## 2021-09-09 ENCOUNTER — Ambulatory Visit: Payer: Medicare Other | Admitting: Podiatry

## 2021-09-09 DIAGNOSIS — N184 Chronic kidney disease, stage 4 (severe): Secondary | ICD-10-CM | POA: Diagnosis not present

## 2021-09-09 DIAGNOSIS — M869 Osteomyelitis, unspecified: Secondary | ICD-10-CM | POA: Diagnosis not present

## 2021-09-09 DIAGNOSIS — E11621 Type 2 diabetes mellitus with foot ulcer: Secondary | ICD-10-CM | POA: Diagnosis not present

## 2021-09-09 DIAGNOSIS — N179 Acute kidney failure, unspecified: Secondary | ICD-10-CM | POA: Diagnosis not present

## 2021-09-09 LAB — RENAL FUNCTION PANEL
Albumin: 2.6 g/dL — ABNORMAL LOW (ref 3.5–5.0)
Anion gap: 8 (ref 5–15)
BUN: 48 mg/dL — ABNORMAL HIGH (ref 6–20)
CO2: 27 mmol/L (ref 22–32)
Calcium: 8.3 mg/dL — ABNORMAL LOW (ref 8.9–10.3)
Chloride: 106 mmol/L (ref 98–111)
Creatinine, Ser: 2.48 mg/dL — ABNORMAL HIGH (ref 0.61–1.24)
GFR, Estimated: 31 mL/min — ABNORMAL LOW (ref >60)
Glucose, Bld: 156 mg/dL — ABNORMAL HIGH (ref 70–99)
Phosphorus: 2.5 mg/dL (ref 2.5–4.6)
Potassium: 4.7 mmol/L (ref 3.5–5.1)
Sodium: 141 mmol/L (ref 135–145)

## 2021-09-09 LAB — CBC
HCT: 28.1 % — ABNORMAL LOW (ref 39.0–52.0)
Hemoglobin: 9.2 g/dL — ABNORMAL LOW (ref 13.0–17.0)
MCH: 28.7 pg (ref 26.0–34.0)
MCHC: 32.7 g/dL (ref 30.0–36.0)
MCV: 87.5 fL (ref 80.0–100.0)
Platelets: 349 10*3/uL (ref 150–400)
RBC: 3.21 MIL/uL — ABNORMAL LOW (ref 4.22–5.81)
RDW: 15.7 % — ABNORMAL HIGH (ref 11.5–15.5)
WBC: 24.4 10*3/uL — ABNORMAL HIGH (ref 4.0–10.5)
nRBC: 0 % (ref 0.0–0.2)

## 2021-09-09 LAB — GLUCOSE, CAPILLARY
Glucose-Capillary: 126 mg/dL — ABNORMAL HIGH (ref 70–99)
Glucose-Capillary: 132 mg/dL — ABNORMAL HIGH (ref 70–99)
Glucose-Capillary: 280 mg/dL — ABNORMAL HIGH (ref 70–99)
Glucose-Capillary: 103 mg/dL — ABNORMAL HIGH (ref 70–99)

## 2021-09-09 MED ORDER — HYDRALAZINE HCL 25 MG PO TABS: 25.0000 mg | ORAL_TABLET | Freq: Three times a day (TID) | ORAL | Status: DC

## 2021-09-09 MED ORDER — HEPARIN SODIUM (PORCINE) 5000 UNIT/ML IJ SOLN
5000.0000 [IU] | Freq: Three times a day (TID) | INTRAMUSCULAR | Status: DC
  Administered 2021-09-09 – 2021-09-15 (×19): 5000 [IU] via SUBCUTANEOUS
  Filled 2021-09-09 (×19): qty 1

## 2021-09-09 MED ORDER — HYDRALAZINE HCL 25 MG PO TABS
25.0000 mg | ORAL_TABLET | Freq: Three times a day (TID) | ORAL | Status: DC
  Administered 2021-09-09 – 2021-09-15 (×18): 25 mg via ORAL
  Filled 2021-09-09 (×18): qty 1

## 2021-09-09 NOTE — Progress Notes (Signed)
PROGRESS NOTE    [name]  SKA:[id number] DOB: 06-01-1970 DOA: 08/29/2021 PCP: Camillia Herter, NP    Chief Complaint  Patient presents with   Fall    Brief Narrative:  [name] is a 51 y.o. 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  He was evaluated by surgery who recommended transtibial amputation.   6/3: Admitted, Cr 5.2 on admission (from baseline 2.5); Podiatry evaluated, MRI showed osteo, no salvage operations were judged possible 6/5: Transferred to [facility], ABIs done, inconclusive    Assessment & Plan:  Principal Problem:   Diabetic ulcer of left foot associated with type 2 diabetes mellitus ([place]) Active Problems:   Osteomyelitis of foot, acute ([place])   Acute kidney injury superimposed on CKD ([place])   Essential hypertension   Coronary atherosclerosis of native coronary artery   Anemia in chronic kidney disease (CKD)   Type 2 diabetes mellitus with hyperglycemia ([place])   Mixed hyperlipidemia   Severe protein-calorie malnutrition ([name])   Transaminitis   CKD (chronic kidney disease), stage IV ([name])   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  -Op note with findings of purulent abscess that extended up to the popliteal fossa with necrotic muscle along the medial head of the gastrocnemius muscle and medial soleus.   -ID consulted and recommended discontinuation of IV vancomycin and IV cefepime and placing patient on daptomycin plus cefazolin.  -ID initially was recommending at least 4 weeks of IV antibiotics and recommending to  check a sed rate and CRP. -Due to renal function unable to place a PICC at this time, nephrology recommending tunneled central line for prolonged antibiotics which was placed 09/04/2021 per IR. -Patient taken back to the OR per orthopedics, Dr. Sharol Given on 09/04/2021 and patient noted to have purulence draining from below the knee amputation and as such orthopedics proceeded with left AKA. -Per orthopedics clear margins noted. -ID following and recommending to continue on daptomycin every other day and cefazolin for the time being until white count normalizes over the next 7 days then can discontinue IV tunneled line.  -Patient still with a significantly elevated leukocytosis. - Continue nutritional supplements, vitamins -ID and orthopedics following and appreciate input and recommendations.  Acute kidney injury superimposed on CKD [facility]) Renal function 5.2 on admission, up from basleine around 2.5-2.8 since Jan. Here, was initially improving, but in last day, despite bicarb fluids, trending back up >4 mg/dL, in setting of vancomycin use. -Patient also noted to be taking NSAIDs prior to admission. - Patient seen in consultation by nephrology and patient placed on a bicarb drip.  -Renal function slowly improved and seems to be stabilizing currently at 2.48 today.   -Bicarb drip has been discontinued. -Nephrology was following but have signed off on 09/05/2021 with recommendations for outpatient follow-up with Dr. [name] after discharge. -Avoid nephrotoxic agents.    Acute postoperative anemia due to expected blood loss - Postoperatively hemoglobin has trended down with hemoglobin at 6.6/6.7 the morning of 09/07/2021 and transfused 2 units packed red blood cells with hemoglobin  at 9.2.. -Patient with no overt bleeding. -Repeat labs in the a.m.  CKD (chronic kidney disease), stage IV ([place]) Cr seems to have worsened since about Jan when he had endocarditis. Now 2.5-2.8 baseline. - Nephrology was  following.  Transaminitis -Total bilirubin has improved currently down to 0.7 -Follow.  Severe protein-calorie malnutrition ([name]) - Albumin was at  < 1.5. -IV albumin every 6 hours x24 to 48 hours was given, albumin currently at 2.6 today. -continue nutritional supplementation with Ensure.  Mixed hyperlipidemia - Repeat LFTs stable.   -Statin resumed.    Type 2 diabetes mellitus with hyperglycemia ([name]) - Hemoglobin A1c 7.5 (08/30/2021 ). -CBG 28 of basic metabolic profile the morning of 09/02/2021. -Patient noted to have received Semglee while n.p.o., which has been discontinued. -CBGs have improved and CBG of 126 this morning. -Discontinued D5 normal saline. -Continue Semglee 10 units daily as patient currently postop. -SSI.  Anemia in chronic kidney disease (CKD) Hgb trickled down to approximately 6.6 on 09/07/2021.   -Patient transfused 2 units packed red blood cells with hemoglobin currently at 9.2.   -Likely secondary to postop acute blood loss anemia. -Status post transfusion 2 units packed red blood cells.   - Continue iron -Follow.  Iron deficiency anemia - Continue iron  Coronary atherosclerosis of native coronary artery - Continue aspirin, BB, statin.  Essential hypertension - Continue amlodipine, Coreg.  -Clonidine held.  -Patient started on hydralazine 25 mg twice daily for better blood pressure control. -Increase hydralazine to 3 times daily.          DVT prophylaxis: Per orthopedics Code Status: Full Family Communication: Updated patient, no family at bedside Disposition: TBD  Status is: Inpatient Remains inpatient appropriate because: Severity of illness   Consultants:  Orthopedics: Dr. Sharol Given 08/31/2021 Podiatry: Dr. Jacqualyn Posey 08/29/2021 Nephrology: Dr. Marval Regal 09/01/2021 Infectious disease: Dr. Baxter Flattery 09/03/2021  Procedures:  Plain films of the left foot 09/25/2021 MRI left foot 08/29/2021 Renal ultrasound 09/01/2021 ABI bilateral 08/31/2021 Left BKA  per orthopedics: Dr. Sharol Given 09/02/2021 Irrigation debridement left foot, bone biopsy.  Podiatry: Dr. Jacqualyn Posey 08/30/2021 Right jugular tunneled dual-lumen CVC line placement per IR, Dr. Pascal Lux 09/04/319 Left AKA application of wound VAC per Dr. Sharol Given 09/04/2021. Transfusion 2 units packed red blood cells 09/07/2021   Antimicrobials:  Oral Flagyl 08/29/2021>>>> 09/05/2021 IV cefepime 08/29/2021>>>> 09/03/2021 IV vancomycin 08/29/2021>>> 09/03/2021 IV daptomycin 09/03/2021>>>> IV Ancef 09/03/2021>>>>>    Subjective: Laying in bed.  No chest pain.  No shortness of breath.  No abdominal pain.  Tolerating current diet.  Asking when he is going to be able to be discharged.  Denies any overt bleeding.  States some pain in left leg with movement.  Objective: Vitals:   09/08/21 1640 09/08/21 2136 09/09/21 0442 09/09/21 0812  BP: (!) 161/82 (!) 158/86 (!) 154/86 (!) 156/89  Pulse: 80 78 79 77  Resp: '17 16 16 16  '$ Temp: 97.9 F (36.6 C) 97.9 F (36.6 C) 98.1 F (36.7 C) 98.2 F (36.8 C)  TempSrc: Oral Oral Oral Oral  SpO2: 100% 100% 99% 100%  Weight:      Height:        Intake/Output Summary (Last 24 hours) at 09/09/2021 1611 Last data filed at 09/09/2021 0900 Gross per 24 hour  Intake --  Output 640 ml  Net -640 ml   Filed Weights   08/29/21 1125 09/02/21 1206 09/04/21 1204  Weight: 87.5 kg 87.5 kg 88.5 kg    Examination:  General exam: NAD. Respiratory system: CTA B.  No wheezes, no crackles, no rhonchi.  Fair air movement. Cardiovascular system: RRR no murmurs rubs or gallops.  No JVD.   Gastrointestinal system: Abdomen is soft, nontender, nondistended, positive bowel sounds.  No rebound.  No guarding.   Central nervous system: Alert and oriented. No focal neurological deficits. Extremities: Status post left AKA with wound VAC noted.  Status post right transmetatarsal amputation.  Skin: No rashes, lesions or ulcers Psychiatry: Judgement and insight appear normal. Mood & affect appropriate.      Data Reviewed:   CBC: Recent Labs  Lab 09/05/21 0142 09/06/21 0053 09/07/21 0505 09/07/21 0657 09/07/21 1820 09/08/21 0436 09/09/21 0430  WBC 27.7* 24.2* 21.6*  --   --  23.7* 24.4*  NEUTROABS 25.5*  --  15.5*  --   --  18.6*  --   HGB 8.5* 7.4* 6.6* 6.7* 9.6* 9.3* 9.2*  HCT 26.2* 23.2* 20.3* 20.7* 29.2* 28.8* 28.1*  MCV 83.2 83.5 84.9  --   --  86.0 87.5  PLT 434* 422* 369  --   --  346 259    Basic Metabolic Panel: Recent Labs  Lab 09/05/21 0142 09/06/21 0053 09/07/21 0505 09/08/21 0436 09/09/21 0430  NA 138 141 144 140 141  K 5.0 4.2 4.5 4.2 4.7  CL 111 109 110 106 106  CO2 '22 26 27 27 27  '$ GLUCOSE 167* 191* 177* 140* 156*  BUN 85* 73* 61* 55* 48*  CREATININE 3.01* 2.93* 2.77* 2.65* 2.48*  CALCIUM 7.2* 7.7* 7.9* 8.5* 8.3*  PHOS 3.9 3.1 2.8 2.6 2.5    GFR: Estimated Creatinine Clearance: 35.3 mL/min (A) (by C-G formula based on SCr of 2.48 mg/dL (H)).  Liver Function Tests: Recent Labs  Lab 09/03/21 0149 09/04/21 0755 09/05/21 0142 09/06/21 0053 09/07/21 0505 09/08/21 0436 09/09/21 0430  AST 42* 23  --   --   --   --   --   ALT 36 19  --   --   --   --   --   ALKPHOS 101 97  --   --   --   --   --   BILITOT 1.0 0.7  --   --   --   --   --   PROT 5.7* 5.8*  --   --   --   --   --   ALBUMIN <1.5* <1.5*  <1.5* <1.5* 2.2* 2.8* 3.1* 2.6*    CBG: Recent Labs  Lab 09/08/21 1127 09/08/21 1643 09/08/21 2233 09/09/21 0813 09/09/21 1147  GLUCAP 187* 134* 145* 126* 132*     Recent Results (from the past 240 hour(s))  Surgical PCR screen     Status: None   Collection Time: 09/02/21  5:30 AM   Specimen: Nasal Mucosa; Nasal Swab  Result Value Ref Range Status   MRSA, PCR NEGATIVE NEGATIVE Final   Staphylococcus aureus NEGATIVE NEGATIVE Final    Comment: (NOTE) The Xpert SA Assay (FDA approved for NASAL specimens in patients 18 years of age and older), is one component of a comprehensive surveillance program. It is not intended to diagnose  infection nor to guide or monitor treatment. Performed at [facility], [place] [address], [place], [place] [address]   Aerobic/Anaerobic Culture w Gram Stain (surgical/deep wound)     Status: None   Collection Time: 09/02/21  3:07 PM   Specimen: Soft Tissue, Other  Result Value Ref Range Status   Specimen Description WOUND  Final   Special Requests  LEFT LEG PUS  Final   Gram Stain   Final    MODERATE WBC PRESENT,BOTH PMN AND MONONUCLEAR FEW GRAM NEGATIVE RODS FEW GRAM POSITIVE COCCI IN PAIRS FEW GRAM POSITIVE COCCI IN CLUSTERS Performed at [facility], [place] [address], [place], [place] [address]    Culture   Final    ABUNDANT PROTEUS MIRABILIS ABUNDANT ENTEROCOCCUS FAECALIS MIXED ANAEROBIC FLORA PRESENT.  CALL LAB IF FURTHER IID REQUIRED.    Report Status 09/07/2021 FINAL  Final   Organism ID, Bacteria PROTEUS MIRABILIS  Final   Organism ID, Bacteria ENTEROCOCCUS FAECALIS  Final      Susceptibility   Enterococcus faecalis - MIC*    AMPICILLIN <=2 SENSITIVE Sensitive     VANCOMYCIN 1 SENSITIVE Sensitive     GENTAMICIN SYNERGY SENSITIVE Sensitive     * ABUNDANT ENTEROCOCCUS FAECALIS   Proteus mirabilis - MIC*    AMPICILLIN <=2 SENSITIVE Sensitive     CEFAZOLIN <=4 SENSITIVE Sensitive     CEFEPIME <=0.12 SENSITIVE Sensitive     CEFTAZIDIME <=1 SENSITIVE Sensitive     CEFTRIAXONE <=0.25 SENSITIVE Sensitive     CIPROFLOXACIN <=0.25 SENSITIVE Sensitive     GENTAMICIN <=1 SENSITIVE Sensitive     IMIPENEM 2 SENSITIVE Sensitive     TRIMETH/SULFA <=20 SENSITIVE Sensitive     AMPICILLIN/SULBACTAM <=2 SENSITIVE Sensitive     PIP/TAZO <=4 SENSITIVE Sensitive     * ABUNDANT PROTEUS MIRABILIS         Radiology Studies: No results found.      Scheduled Meds:  amLODipine  10 mg Oral Daily   vitamin C  1,000 mg Oral Daily   aspirin EC  81 mg Oral Daily   atorvastatin  20 mg Oral Daily   carvedilol  25 mg Oral BID WC   Chlorhexidine Gluconate Cloth  6 each  Topical Daily   docusate sodium  100 mg Oral Daily   feeding supplement  237 mL Oral TID BM   ferrous sulfate  325 mg Oral Q breakfast   gabapentin  300 mg Oral QHS   heparin injection (subcutaneous)  5,000 Units Subcutaneous Q8H   hydrALAZINE  25 mg Oral BID   insulin aspart  0-15 Units Subcutaneous TID WC   insulin glargine-yfgn  10 Units Subcutaneous Daily   multivitamin with minerals  1 tablet Oral Daily   nutrition supplement (JUVEN)  1 packet Oral BID BM   pantoprazole  40 mg Oral Daily   zinc sulfate  220 mg Oral Daily   Continuous Infusions:   ceFAZolin (ANCEF) IV 2 g (09/09/21 0952)   DAPTOmycin (CUBICIN) 550 mg in sodium chloride 0.9 % IVPB 550 mg (09/07/21 2150)   magnesium sulfate bolus IVPB       LOS: 11 days    Time spent: 35 minutes    Irine Seal, MD [facility]   To contact the attending provider between 7A-7P or the covering provider during after hours 7P-7A, please log into the web site [email or web address] and access using universal [place] password for that web site. If you do not have the password, please call the hospital operator.  09/09/2021, 4:11 PM

## 2021-09-09 NOTE — Progress Notes (Signed)
Pharmacy Antibiotic Note  [name] is a 51 y.o. 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 Patient then underwent AKA 6/9 with clean margins presumably. Pharmacy has been consulted for Daptomycin + Cefazolin dosing.  L-foot wound cx with pan-sensitive Proteus + E faecalis from 6/4. Cultures from residual infection on 6/7 showing the same organisms.   WBC 24.4. AKI improving with most recent SCr 2.48. Per ID, continue daptomycin and cefazolin until WBC normalize through 6/17.   Plan: - Continue Cefazolin 2g IV every 8 hours - Continue Daptomycin 550 mg IV every 48 hours - Continue to monitor for s/sx of infection and clinical course  Height: '5\' 4"'$  (162.6 cm) Weight: 88.5 kg (195 lb) IBW/kg (Calculated) : 59.2  Temp (24hrs), Avg:98 F (36.7 C), Min:97.9 F (36.6 C), Max:98.2 F (36.8 C)  Recent Labs  Lab 09/03/21 1108 09/04/21 0755 09/05/21 0142 09/06/21 0053 09/07/21 0505 09/08/21 0436 09/09/21 0430  WBC  --    < > 27.7* 24.2* 21.6* 23.7* 24.4*  CREATININE  --    < > 3.01* 2.93* 2.77* 2.65* 2.48*  VANCORANDOM 19  --   --   --   --   --   --    < > = values in this interval not displayed.     Estimated Creatinine Clearance: 35.3 mL/min (A) (by C-G formula based on SCr of 2.48 mg/dL (H)).    Allergies  Allergen Reactions   Bee Venom Swelling    SWELLING REACTION UNSPECIFIED    Penicillins Hives and Other (See Comments)    Full body hives as a child with no shortness of breath or swelling  **Can tolerate cephalosporins**   Clonidine Derivatives Other (See Comments)    Pt states "It is making me dizzy"    Antimicrobials this admission: Vancomycin 6/3 >> 6/7 Cefepime 6/3 >> 6/7 Daptomycin 6/8 >> 6/17 Cefazolin 6/8 >> 6/17  Microbiology results: 6/3 BCx >> ngtd 6/3 + 6/4 L-foot wound cx >> pan-sensitive proteus + E faecalis 6/7 MRSA PCR >> neg 6/7 L-leg cx (pus intra-op) >> abundant proteus mirabilis and  enterococcus faecalis  Thank you for allowing pharmacy to be a part of this patient's care.  Luisa Hart, PharmD, BCPS Clinical Pharmacist 09/09/2021 10:16 AM   Please refer to AMION for pharmacy phone number

## 2021-09-09 NOTE — Telephone Encounter (Signed)
LVM informing pt that orthotics have not come in yet but we will reach out to schedule a fitting once they are received.

## 2021-09-09 NOTE — Progress Notes (Signed)
Occupational Therapy Treatment Patient Details Name: [name] MRN: [id number] DOB: 11/29/1970 Today's Date: 09/09/2021   History of present illness Pt is a 51 yr old male who who presented 08/29/21 due to L foot infection, found to have osteo of the cuboid and fifth metatarsal. 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 PMH: allergies, iron deficiency anemia, 3B stage CKD, CAD, constipation, type II DM, glaucoma, hyperlipidemia, hypertension, diabetic peripheral neuropathy, diabetic retinopathy, tobacco use, multiple lower extremities I&D and amputations   OT comments  Pt making good progress towards OT goals. Emphasis on bed mobility, dynamic sitting balance and UE HEP education to maximize UB strength for ADLs/transfers. Pt with good carryover and good balance throughout session. Provided encouragement and education regarding rehab trajectory, w/c level independence goal while awaiting LE healing/prosthetic fitting. Plan to bring w/c during next session to utilize during ADLs.    Recommendations for follow up therapy are one component of a multi-disciplinary discharge planning process, led by the attending physician.  Recommendations may be updated based on patient status, additional functional criteria and insurance authorization.    Follow Up Recommendations  Skilled nursing-short term rehab (<3 hours/day) (does not qualify for CIR per notes)    Assistance Recommended at Discharge Intermittent Supervision/Assistance  Patient can return home with the following  A lot of help with walking and/or transfers;A lot of help with bathing/dressing/bathroom;Assistance with cooking/housework;Assist for transportation;Direct supervision/assist for medications management;Direct supervision/assist for financial management   Equipment Recommendations  Wheelchair (measurements OT);Wheelchair cushion (measurements OT)    Recommendations for Other Services      Precautions /  Restrictions Precautions Precautions: Fall Precaution Comments: wound vac L LE, previous R foot amputation Restrictions Weight Bearing Restrictions: Yes LLE Weight Bearing: Non weight bearing       Mobility Bed Mobility Overal bed mobility: Needs Assistance Bed Mobility: Supine to Sit     Supine to sit: Supervision, HOB elevated     General bed mobility comments: use of bed rail and increased time    Transfers                         Balance Overall balance assessment: Needs assistance Sitting-balance support: No upper extremity supported, Feet supported Sitting balance-Leahy Scale: Good Sitting balance - Comments: able to complete UE HEP without LOB                                   ADL either performed or assessed with clinical judgement   ADL Overall ADL's : Needs assistance/impaired                                       General ADL Comments: Focus on education of UE HEP sitting unsupported EOB for balance challenge. Planned education/discussion of rehab trajectory, trial of w/c for ADL completion as pt will likely have to DC from rehab at w/c level    Extremity/Trunk Assessment Upper Extremity Assessment Upper Extremity Assessment: Overall WFL for tasks assessed   Lower Extremity Assessment Lower Extremity Assessment: Defer to PT evaluation        Vision   Vision Assessment?: No apparent visual deficits   Perception     Praxis      Cognition Arousal/Alertness: Awake/alert Behavior During Therapy: [facility] for tasks  assessed/performed Overall Cognitive Status: Within Functional Limits for tasks assessed                                          Exercises      Shoulder Instructions       General Comments      Pertinent Vitals/ Pain       Pain Assessment Pain Assessment: Faces Faces Pain Scale: Hurts a little bit Pain Location: L residual limb Pain Descriptors / Indicators: Sore,  Grimacing Pain Intervention(s): Monitored during session, Premedicated before session  Home Living                                          Prior Functioning/Environment              Frequency  Min 2X/week        Progress Toward Goals  OT Goals(current goals can now be found in the care plan section)  Progress towards OT goals: Progressing toward goals  Acute Rehab OT Goals Patient Stated Goal: get a prosthetic OT Goal Formulation: With patient Time For Goal Achievement: 09/17/21 Potential to Achieve Goals: Fair ADL Goals Pt Will Perform Grooming: with set-up;sitting Pt Will Perform Upper Body Bathing: with set-up;sitting Pt Will Perform Lower Body Bathing: with mod assist;sitting/lateral leans;sit to/from stand Pt Will Transfer to Toilet: with min assist;with +2 assist;squat pivot transfer;bedside commode  Plan Discharge plan remains appropriate    Co-evaluation                 AM-PAC OT "6 Clicks" Daily Activity     Outcome Measure   Help from another person eating meals?: A Little Help from another person taking care of personal grooming?: A Little Help from another person toileting, which includes using toliet, bedpan, or urinal?: A Lot Help from another person bathing (including washing, rinsing, drying)?: A Lot Help from another person to put on and taking off regular upper body clothing?: A Little Help from another person to put on and taking off regular lower body clothing?: A Lot 6 Click Score: 15    End of Session    OT Visit Diagnosis: Unsteadiness on feet (R26.81);Other abnormalities of gait and mobility (R26.89);Repeated falls (R29.6);Muscle weakness (generalized) (M62.81);Pain Pain - Right/Left: Left Pain - part of body: Leg   Activity Tolerance Patient tolerated treatment well   Patient Left in bed;with call bell/phone within reach;Other (comment) (sitting EOB eating lunch)   Nurse Communication Mobility status         Time: 9093-1121 OT Time Calculation (min): 40 min  Charges: OT General Charges $OT Visit: 1 Visit OT Treatments $Self Care/Home Management : 8-22 mins $Therapeutic Activity: 8-22 mins $Therapeutic Exercise: 8-22 mins  Malachy Chamber, OTR/L Acute Rehab Services Office: [phone]   Layla Maw 09/09/2021, 3:09 PM

## 2021-09-09 NOTE — Progress Notes (Signed)
Physical Therapy Treatment Patient Details Name: [name] MRN: [id number] DOB: March 03, 1971 Today's Date: 09/09/2021   History of Present Illness Pt is a 51 yr old male who who presented 08/29/21 due to L foot infection, found to have osteo of the cuboid and fifth metatarsal. 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 PMH: allergies, iron deficiency anemia, 3B stage CKD, CAD, constipation, type II DM, glaucoma, hyperlipidemia, hypertension, diabetic peripheral neuropathy, diabetic retinopathy, tobacco use, multiple lower extremities I&D and amputations    PT Comments    Pt continues to be motivated to engage with therapy. Session focusing on working towards independence with wc transfers and mobility. Education provided on proper setup for wc and slide board to ensure safe and efficient transfers. Pt would benefit from further practice with these transfers. Pt able to propel wc independently but requiring cueing and assistance for management of breaks. Pt educated on positioning of residual limb and instructed on supine isometric hip extension to encourage glute activation and extension ROM for future prosthetic. SNF continues to be the most appropriate discharge disposition due to pts current level of required assistance and lack of physical assistance available at home.    Recommendations for follow up therapy are one component of a multi-disciplinary discharge planning process, led by the attending physician.  Recommendations may be updated based on patient status, additional functional criteria and insurance authorization.  Follow Up Recommendations  Skilled nursing-short term rehab (<3 hours/day) (insurance will not approve AIR)     Assistance Recommended at Discharge Frequent or constant Supervision/Assistance  Patient can return home with the following A lot of help with walking and/or transfers;A little help with bathing/dressing/bathroom;Assistance with  cooking/housework;Assist for transportation;Help with stairs or ramp for entrance   Equipment Recommendations  Other (comment) (tub bench, slide board)    Recommendations for Other Services       Precautions / Restrictions Precautions Precautions: Fall Precaution Comments: wound vac L LE, previous R foot amputation Restrictions Weight Bearing Restrictions: Yes LLE Weight Bearing: Non weight bearing     Mobility  Bed Mobility           Sit to supine: Supervision   General bed mobility comments: sitting EOB upon arrival    Transfers Overall transfer level: Needs assistance Equipment used: Sliding board Transfers: Bed to chair/wheelchair/BSC            Lateral/Scoot Transfers: Min assist, +2 physical assistance General transfer comment: cues for hand placement and using flat palm to protect fingers, bed pad assist for sliding and knee block in front for safety    Ambulation/Gait                   Theme park manager mobility: Yes Wheelchair propulsion: Both upper extremities Wheelchair parts: Independent Distance: 150 (50 x 3) Wheelchair Assistance Details (indicate cue type and reason): required muiltiple rest breaks  Modified Rankin (Stroke Patients Only)       Balance Overall balance assessment: No apparent balance deficits (not formally assessed)                                          Cognition Arousal/Alertness: Awake/alert Behavior During Therapy: WFL for tasks assessed/performed Overall Cognitive Status: Within Functional Limits for tasks assessed  Exercises      General Comments        Pertinent Vitals/Pain Pain Assessment Pain Assessment: Faces Faces Pain Scale: Hurts little more Pain Location: L residual limb Pain Descriptors / Indicators: Sore, Grimacing Pain Intervention(s): Limited  activity within patient's tolerance    Home Living                          Prior Function            PT Goals (current goals can now be found in the care plan section) Acute Rehab PT Goals Patient Stated Goal: to go home PT Goal Formulation: With patient Time For Goal Achievement: 09/17/21 Potential to Achieve Goals: Good Progress towards PT goals: Progressing toward goals    Frequency    Min 3X/week      PT Plan Current plan remains appropriate    Co-evaluation              AM-PAC PT "6 Clicks" Mobility   Outcome Measure  Help needed turning from your back to your side while in a flat bed without using bedrails?: None Help needed moving from lying on your back to sitting on the side of a flat bed without using bedrails?: A Little Help needed moving to and from a bed to a chair (including a wheelchair)?: A Lot Help needed standing up from a chair using your arms (e.g., wheelchair or bedside chair)?: Total Help needed to walk in hospital room?: Total Help needed climbing 3-5 steps with a railing? : Total 6 Click Score: 12    End of Session Equipment Utilized During Treatment: Gait belt Activity Tolerance: Patient tolerated treatment well Patient left: in bed;with call bell/phone within reach Nurse Communication: Mobility status PT Visit Diagnosis: Unsteadiness on feet (R26.81);Muscle weakness (generalized) (M62.81);Difficulty in walking, not elsewhere classified (R26.2);Pain Pain - Right/Left: Left Pain - part of body: Leg     Time: 3664-4034 PT Time Calculation (min) (ACUTE ONLY): 31 min  Charges:  $Therapeutic Activity: 23-37 mins                     Mackie Pai, SPT Acute Rehabilitation Services  Office: [phone]    Mackie Pai 09/09/2021, 5:20 PM

## 2021-09-10 ENCOUNTER — Inpatient Hospital Stay (HOSPITAL_COMMUNITY): Payer: Medicare Other

## 2021-09-10 DIAGNOSIS — M86172 Other acute osteomyelitis, left ankle and foot: Secondary | ICD-10-CM

## 2021-09-10 DIAGNOSIS — E11621 Type 2 diabetes mellitus with foot ulcer: Secondary | ICD-10-CM | POA: Diagnosis not present

## 2021-09-10 DIAGNOSIS — L97429 Non-pressure chronic ulcer of left heel and midfoot with unspecified severity: Secondary | ICD-10-CM | POA: Diagnosis not present

## 2021-09-10 DIAGNOSIS — D72829 Elevated white blood cell count, unspecified: Secondary | ICD-10-CM

## 2021-09-10 DIAGNOSIS — E1165 Type 2 diabetes mellitus with hyperglycemia: Secondary | ICD-10-CM | POA: Diagnosis not present

## 2021-09-10 DIAGNOSIS — N179 Acute kidney failure, unspecified: Secondary | ICD-10-CM | POA: Diagnosis not present

## 2021-09-10 DIAGNOSIS — M869 Osteomyelitis, unspecified: Secondary | ICD-10-CM | POA: Diagnosis not present

## 2021-09-10 DIAGNOSIS — D62 Acute posthemorrhagic anemia: Secondary | ICD-10-CM | POA: Diagnosis not present

## 2021-09-10 LAB — CBC WITH DIFFERENTIAL/PLATELET
Abs Immature Granulocytes: 0.25 10*3/uL — ABNORMAL HIGH (ref 0.00–0.07)
Basophils Absolute: 0 10*3/uL (ref 0.0–0.1)
Basophils Relative: 0 %
Eosinophils Absolute: 0.3 10*3/uL (ref 0.0–0.5)
Eosinophils Relative: 1 %
HCT: 30.5 % — ABNORMAL LOW (ref 39.0–52.0)
Hemoglobin: 9.7 g/dL — ABNORMAL LOW (ref 13.0–17.0)
Immature Granulocytes: 1 %
Lymphocytes Relative: 10 %
Lymphs Abs: 2.6 10*3/uL (ref 0.7–4.0)
MCH: 28.1 pg (ref 26.0–34.0)
MCHC: 31.8 g/dL (ref 30.0–36.0)
MCV: 88.4 fL (ref 80.0–100.0)
Monocytes Absolute: 0.7 10*3/uL (ref 0.1–1.0)
Monocytes Relative: 3 %
Neutro Abs: 21.2 10*3/uL — ABNORMAL HIGH (ref 1.7–7.7)
Neutrophils Relative %: 85 %
Platelets: 376 10*3/uL (ref 150–400)
RBC: 3.45 MIL/uL — ABNORMAL LOW (ref 4.22–5.81)
RDW: 15.8 % — ABNORMAL HIGH (ref 11.5–15.5)
WBC: 25.1 10*3/uL — ABNORMAL HIGH (ref 4.0–10.5)
nRBC: 0 % (ref 0.0–0.2)

## 2021-09-10 LAB — RENAL FUNCTION PANEL
Albumin: 2.8 g/dL — ABNORMAL LOW (ref 3.5–5.0)
Anion gap: 9 (ref 5–15)
BUN: 44 mg/dL — ABNORMAL HIGH (ref 6–20)
CO2: 24 mmol/L (ref 22–32)
Calcium: 8.5 mg/dL — ABNORMAL LOW (ref 8.9–10.3)
Chloride: 107 mmol/L (ref 98–111)
Creatinine, Ser: 2.42 mg/dL — ABNORMAL HIGH (ref 0.61–1.24)
GFR, Estimated: 32 mL/min — ABNORMAL LOW (ref >60)
Glucose, Bld: 104 mg/dL — ABNORMAL HIGH (ref 70–99)
Phosphorus: 3.1 mg/dL (ref 2.5–4.6)
Potassium: 4.7 mmol/L (ref 3.5–5.1)
Sodium: 140 mmol/L (ref 135–145)

## 2021-09-10 LAB — GLUCOSE, CAPILLARY
Glucose-Capillary: 80 mg/dL (ref 70–99)
Glucose-Capillary: 122 mg/dL — ABNORMAL HIGH (ref 70–99)
Glucose-Capillary: 153 mg/dL — ABNORMAL HIGH (ref 70–99)
Glucose-Capillary: 156 mg/dL — ABNORMAL HIGH (ref 70–99)

## 2021-09-10 NOTE — Progress Notes (Signed)
Mobility Specialist Progress Note:   09/10/21 1050  Mobility  Activity  (HEP handout)  Range of Motion/Exercises Active;Right arm;Left arm  Level of Assistance Independent  Assistive Device None  Activity Response Tolerated well  $Mobility charge 1 Mobility   Pt eager for mobility session. Went through [employer], pt tolerated well. Left with all needs met.   [name] Acute Rehab Secure Chat or Office Phone: [phone]

## 2021-09-10 NOTE — Progress Notes (Signed)
PROGRESS NOTE    Aristeo Hankerson Perin  RSW:[id number] DOB: July 19, 1970 DOA: 08/29/2021 PCP: Camillia Herter, NP    Chief Complaint  Patient presents with   Fall    Brief Narrative:  Mr. Sudberry is a 51 y.o. 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  He was evaluated by surgery who recommended transtibial amputation.   6/3: Admitted, Cr 5.2 on admission (from baseline 2.5); Podiatry evaluated, MRI showed osteo, no salvage operations were judged possible 6/5: Transferred to [facility], ABIs done, inconclusive    Assessment & Plan:  Principal Problem:   Diabetic ulcer of left foot associated with type 2 diabetes mellitus (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  -Op note with findings of purulent abscess that extended up to the popliteal fossa with necrotic muscle along the medial head of the gastrocnemius muscle and medial soleus.   -ID consulted and recommended discontinuation of IV vancomycin and IV cefepime and placing patient on daptomycin plus cefazolin.  -ID initially was recommending at least 4 weeks of IV antibiotics and  recommending to check a sed rate and CRP. -Due to renal function unable to place a PICC at this time, nephrology recommending tunneled central line for prolonged antibiotics which was placed 09/04/2021 per IR. -Patient taken back to the OR per orthopedics, Dr. Sharol Given on 09/04/2021 and patient noted to have purulence draining from below the knee amputation and as such orthopedics proceeded with left AKA. -Per orthopedics clear margins noted. -ID following and recommending to continue on daptomycin every other day and cefazolin for the time being until white count normalizes over the next 7 days then can discontinue IV tunneled line.  -Patient still with a significantly elevated leukocytosis with no significant clinical improvement. -CT left hip and pelvis and CT left femur ordered and negative for abscess or any signs of septic arthritis.. - Continue nutritional supplements, vitamins -ID and orthopedics following and appreciate input and recommendations.  Acute kidney injury superimposed on CKD [facility]) Renal function 5.2 on admission, up from basleine around 2.5-2.8 since Jan. Here, was initially improving, but in last day, despite bicarb fluids, trending back up >4 mg/dL, in setting of vancomycin use. -Patient also noted to be taking NSAIDs prior to admission. - Patient seen in consultation by nephrology and patient placed on a bicarb drip.  -Renal function slowly improved and seems to be stabilizing currently at 2.42 today.   -Bicarb drip has been discontinued. -Nephrology was following but have signed off on 09/05/2021 with recommendations for outpatient follow-up with Dr. [name] after discharge. -Avoid nephrotoxic agents.    Acute postoperative anemia due  to expected blood loss - Postoperatively hemoglobin has trended down with hemoglobin at 6.6/6.7 the morning of 09/07/2021 and transfused 2 units packed red blood cells with hemoglobin at 9.7.Marland Kitchen -Patient with no overt bleeding. -Repeat labs in the  a.m.  CKD (chronic kidney disease), stage IV ([place]) Cr seems to have worsened since about Jan when he had endocarditis. Now 2.5-2.8 baseline. - Nephrology was following.  Transaminitis -Total bilirubin has improved currently down to 0.7 -Follow.  Severe protein-calorie malnutrition ([name]) - Albumin was at  < 1.5. -IV albumin every 6 hours x24 to 48 hours was given, albumin currently at 2.8 today. -continue nutritional supplementation with Ensure.  Mixed hyperlipidemia - Repeat LFTs stable.   -Statin resumed.    Type 2 diabetes mellitus with hyperglycemia ([name]) - Hemoglobin A1c 7.5 (08/30/2021 ). -CBG 28 of basic metabolic profile the morning of 09/02/2021. -Patient noted to have received Semglee while n.p.o., which has been discontinued. -CBGs have improved and CBG of 80 this morning. -Discontinued D5 normal saline. -Continue Semglee 10 units daily, SSI.   Leukocytosis - Patient with persistent leukocytosis white count of 25.1. -Patient on IV antibiotics to complete a 7-day course per ID recommendations. -Patient status post AKA. -CT left hip and pelvis/CT left femur negative for any abscess or septic arthritis. 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 -Finish out 7-day course of IV antibiotics as recommended by ID. -ID following and appreciate input and recommendations.  Anemia in chronic kidney disease (CKD) Hgb trickled down to approximately 6.6 on 09/07/2021.   -Patient transfused 2 units packed red blood cells with hemoglobin currently at 9.7.   -Likely secondary to postop acute blood loss anemia. -Status post transfusion 2 units packed red blood cells.   - Continue iron -Follow.  Iron deficiency anemia - Continue iron  Coronary atherosclerosis of native coronary artery - Continue aspirin, BB, statin.  Essential hypertension - Continue amlodipine, Coreg.   -Clonidine held, likely will not resume on discharge -Continue hydralazine 25 mg 3 times daily.          DVT prophylaxis: Per orthopedics Code Status: Full Family Communication: Updated patient, no family at bedside Disposition: TBD  Status is: Inpatient Remains inpatient appropriate because: Severity of illness   Consultants:  Orthopedics: Dr. Sharol Given 08/31/2021 Podiatry: Dr. Jacqualyn Posey 08/29/2021 Nephrology: Dr. Marval Regal 09/01/2021 Infectious disease: Dr. Baxter Flattery 09/03/2021  Procedures:  Plain films of the left foot 09/25/2021 MRI left foot 08/29/2021 Renal ultrasound 09/01/2021 ABI bilateral 08/31/2021 Left BKA per orthopedics: Dr. Sharol Given 09/02/2021 Irrigation debridement left foot, bone biopsy.  Podiatry: Dr. Jacqualyn Posey 08/30/2021 Right jugular tunneled dual-lumen CVC line placement per IR, Dr. Pascal Lux 05/04/35 Left AKA application of wound VAC per Dr. Sharol Given 09/04/2021. Transfusion 2 units packed red blood cells 09/07/2021 CT left hip and pelvis 09/10/2021 CT left femur 09/10/2021   Antimicrobials:  Oral Flagyl 08/29/2021>>>> 09/05/2021 IV cefepime 08/29/2021>>>> 09/03/2021 IV vancomycin 08/29/2021>>> 09/03/2021 IV daptomycin 09/03/2021>>>> IV Ancef 09/03/2021>>>>>    Subjective: Laying in bed.  Just returned from CT.  No chest pain.  No shortness of breath.  No abdominal pain.  Overall feeling well.  Still with complaints of pain when he moves his left leg.    Objective: Vitals:   09/09/21 1945 09/10/21 0535 09/10/21 0813 09/10/21 1630  BP: (!) 141/87 (!) 153/94 (!) 156/88 (!) 151/87  Pulse: 74 73 81 80  Resp: '18 17 16 '$ 16  Temp: 98.4 F (36.9 C) (!) 97.5 F (36.4 C) 97.8 F (36.6 C) 97.9 F (36.6 C)  TempSrc: Oral Oral Oral Oral  SpO2: 100% 100% 100% 100%  Weight:      Height:        Intake/Output Summary (Last 24 hours) at 09/10/2021 2016 Last data filed at 09/10/2021 2000 Gross per 24 hour  Intake 841 ml  Output 1650 ml  Net -809 ml   Filed Weights   08/29/21 1125 09/02/21 1206 09/04/21  1204  Weight: 87.5 kg 87.5 kg 88.5 kg    Examination:  General exam: NAD. Respiratory system: Clear to auscultation bilaterally anterior lung fields.  No wheezes, no crackles, no rhonchi.  Fair air movement.  Cardiovascular system: Regular rate rhythm no murmurs rubs or gallops.  No JVD.  Gastrointestinal system: Abdomen soft, nontender, nondistended, positive bowel sounds.  No rebound.  No guarding.   Central nervous system: Alert and oriented. No focal neurological deficits. Extremities: Status post left AKA with wound VAC noted.  Status post right transmetatarsal amputation.  Skin: No rashes, lesions or ulcers Psychiatry: Judgement and insight appear normal. Mood & affect appropriate.     Data Reviewed:   CBC: Recent Labs  Lab 09/05/21 0142 09/06/21 0053 09/07/21 0505 09/07/21 1308 09/07/21 1820 09/08/21 0436 09/09/21 0430 09/10/21 0450  WBC 27.7* 24.2* 21.6*  --   --  23.7* 24.4* 25.1*  NEUTROABS 25.5*  --  15.5*  --   --  18.6*  --  21.2*  HGB 8.5* 7.4* 6.6* 6.7* 9.6* 9.3* 9.2* 9.7*  HCT 26.2* 23.2* 20.3* 20.7* 29.2* 28.8* 28.1* 30.5*  MCV 83.2 83.5 84.9  --   --  86.0 87.5 88.4  PLT 434* 422* 369  --   --  346 349 657    Basic Metabolic Panel: Recent Labs  Lab 09/06/21 0053 09/07/21 0505 09/08/21 0436 09/09/21 0430 09/10/21 0450  NA 141 144 140 141 140  K 4.2 4.5 4.2 4.7 4.7  CL 109 110 106 106 107  CO2 '26 27 27 27 24  '$ GLUCOSE 191* 177* 140* 156* 104*  BUN 73* 61* 55* 48* 44*  CREATININE 2.93* 2.77* 2.65* 2.48* 2.42*  CALCIUM 7.7* 7.9* 8.5* 8.3* 8.5*  PHOS 3.1 2.8 2.6 2.5 3.1    GFR: Estimated Creatinine Clearance: 36.2 mL/min (A) (by C-G formula based on SCr of 2.42 mg/dL (H)).  Liver Function Tests: Recent Labs  Lab 09/04/21 0755 09/05/21 0142 09/06/21 0053 09/07/21 0505 09/08/21 0436 09/09/21 0430 09/10/21 0450  AST 23  --   --   --   --   --   --   ALT 19  --   --   --   --   --   --   ALKPHOS 97  --   --   --   --   --   --   BILITOT  0.7  --   --   --   --   --   --   PROT 5.8*  --   --   --   --   --   --   ALBUMIN <1.5*  <1.5*   < > 2.2* 2.8* 3.1* 2.6* 2.8*   < > = values in this interval not displayed.    CBG: Recent Labs  Lab 09/09/21 1815 09/09/21 1945 09/10/21 0814 09/10/21 1257 09/10/21 1629  GLUCAP 280* 103* 80 122* 153*     Recent Results (from the past 240 hour(s))  Surgical PCR screen     Status: None   Collection Time: 09/02/21  5:30 AM   Specimen: Nasal Mucosa; Nasal Swab  Result Value Ref Range Status   MRSA, PCR NEGATIVE NEGATIVE Final   Staphylococcus aureus NEGATIVE NEGATIVE Final    Comment: (NOTE) The Xpert SA Assay (FDA approved for NASAL specimens in patients 13 years of age and older), is one component of a comprehensive surveillance program. It is not intended to diagnose infection nor to guide or monitor treatment. Performed at [facility], [place] [address] [place], [place] [address]   Aerobic/Anaerobic Culture w Gram Stain (surgical/deep wound)     Status: None   Collection Time: 09/02/21  3:07 PM   Specimen: Soft Tissue, Other  Result Value Ref Range Status   Specimen Description WOUND  Final   Special Requests LEFT LEG PUS  Final   Gram Stain   Final    MODERATE WBC PRESENT,BOTH PMN AND MONONUCLEAR FEW GRAM NEGATIVE RODS FEW GRAM POSITIVE COCCI IN PAIRS FEW GRAM POSITIVE COCCI IN CLUSTERS Performed at [facility], [place] [address] [place], [place] [address]    Culture   Final    ABUNDANT PROTEUS MIRABILIS ABUNDANT ENTEROCOCCUS FAECALIS MIXED ANAEROBIC FLORA PRESENT.  CALL LAB IF FURTHER IID REQUIRED.    Report Status 09/07/2021 FINAL  Final   Organism ID, Bacteria PROTEUS MIRABILIS  Final   Organism ID, Bacteria ENTEROCOCCUS FAECALIS  Final      Susceptibility   Enterococcus faecalis - MIC*    AMPICILLIN <=2 SENSITIVE Sensitive     VANCOMYCIN 1 SENSITIVE Sensitive     GENTAMICIN SYNERGY SENSITIVE Sensitive     * ABUNDANT ENTEROCOCCUS FAECALIS    Proteus mirabilis - MIC*    AMPICILLIN <=2 SENSITIVE Sensitive     CEFAZOLIN <=4 SENSITIVE Sensitive     CEFEPIME <=0.12 SENSITIVE Sensitive     CEFTAZIDIME <=1 SENSITIVE Sensitive     CEFTRIAXONE <=0.25 SENSITIVE Sensitive     CIPROFLOXACIN <=0.25 SENSITIVE Sensitive     GENTAMICIN <=1 SENSITIVE Sensitive     IMIPENEM 2 SENSITIVE Sensitive     TRIMETH/SULFA <=20 SENSITIVE Sensitive     AMPICILLIN/SULBACTAM <=2 SENSITIVE Sensitive     PIP/TAZO <=4 SENSITIVE Sensitive     * ABUNDANT PROTEUS MIRABILIS         Radiology Studies: CT FEMUR LEFT WO CONTRAST  Result Date: 09/10/2021 CLINICAL DATA:  Osteomyelitis suspected, femur, no prior imaging. Above knee amputation on 09/04/2021 EXAM: CT OF THE LOWER LEFT EXTREMITY WITHOUT CONTRAST TECHNIQUE: Multidetector CT imaging of the lower left extremity was performed according to the standard protocol. RADIATION DOSE REDUCTION: This exam was performed according to the departmental dose-optimization program which includes automated exposure control, adjustment of the mA and/or kV according to patient size and/or use of iterative reconstruction technique. COMPARISON:  None Available. FINDINGS: Bones/Joint/Cartilage Postsurgical changes from recent above knee amputation of the left femur. Sharp amputation margin without bony irregularity or erosion. Remainder of the left femur is normal in appearance. Mild joint space narrowing of the left hip. No appreciable hip joint effusion. No dislocation. No evidence of femoral head avascular necrosis. Included portion of the left hemipelvis is intact. Ligaments Suboptimally assessed by CT. Muscles and Tendons Amputation changes of the distal thigh. Otherwise no acute musculotendinous abnormality. Soft tissues Minimal edema at the distal stump where there is overlying skin staples. No organized fluid collection. Mild subcutaneous edema along the anterolateral aspect of the left hip. No  soft tissue gas. Mildly prominent  left inguinal lymph nodes, likely reactive. IMPRESSION: 1. Postsurgical changes from recent above-the-knee amputation of the left femur. No evidence of osteomyelitis. 2. Mild subcutaneous edema along the anterolateral aspect of the left hip, nonspecific but could represent cellulitis in the appropriate clinical setting. Electronically Signed   By: Davina Poke D.O.   On: 09/10/2021 13:40   CT PELVIS WO CONTRAST  Result Date: 09/10/2021 CLINICAL DATA:  Osteomyelitis suspected, pelvis, no prior imaging EXAM: CT PELVIS WITHOUT CONTRAST TECHNIQUE: Multidetector CT imaging of the pelvis was performed following the standard protocol without intravenous contrast. RADIATION DOSE REDUCTION: This exam was performed according to the departmental dose-optimization program which includes automated exposure control, adjustment of the mA and/or kV according to patient size and/or use of iterative reconstruction technique. COMPARISON:  May 14, 2020 FINDINGS: Urinary Tract:  No abnormality visualized. Bowel:  Unremarkable visualized pelvic bowel loops. Vascular/Lymphatic: No pathologically enlarged lymph nodes. No significant vascular abnormality seen. Reproductive:  No mass or other significant abnormality Other:  None. Musculoskeletal: No suspicious bone lesions identified. No evidence of osteomyelitis. Moderate spondylosis most prominent at L5-S1. At L5-S1, there is decrease in the height of the disc space, vacuum disc phenomenon and prominent marginal osteophytes seen. There is some subcutaneous edema seen at the bilateral flanks extending into the lateral aspect of the hip and proximal thigh areas. IMPRESSION: Bony pelvis has a normal appearance without evidence of osteomyelitis. Moderate spondylosis in the lumbar spine and most prominent at L5-S1. At L5-S1, there is decrease in the height of the disc space, vacuum disc phenomenon and prominent marginal osteophytes seen. Electronically Signed   By: Frazier Richards  M.D.   On: 09/10/2021 13:22        Scheduled Meds:  amLODipine  10 mg Oral Daily   vitamin C  1,000 mg Oral Daily   aspirin EC  81 mg Oral Daily   atorvastatin  20 mg Oral Daily   carvedilol  25 mg Oral BID WC   Chlorhexidine Gluconate Cloth  6 each Topical Daily   docusate sodium  100 mg Oral Daily   feeding supplement  237 mL Oral TID BM   ferrous sulfate  325 mg Oral Q breakfast   gabapentin  300 mg Oral QHS   heparin injection (subcutaneous)  5,000 Units Subcutaneous Q8H   hydrALAZINE  25 mg Oral Q8H   insulin aspart  0-15 Units Subcutaneous TID WC   insulin glargine-yfgn  10 Units Subcutaneous Daily   multivitamin with minerals  1 tablet Oral Daily   nutrition supplement (JUVEN)  1 packet Oral BID BM   pantoprazole  40 mg Oral Daily   zinc sulfate  220 mg Oral Daily   Continuous Infusions:   ceFAZolin (ANCEF) IV 2 g (09/10/21 1741)   DAPTOmycin (CUBICIN) 550 mg in sodium chloride 0.9 % IVPB 550 mg (09/09/21 1941)   magnesium sulfate bolus IVPB       LOS: 12 days    Time spent: 35 minutes    Irine Seal, MD [facility]   To contact the attending provider between 7A-7P or the covering provider during after hours 7P-7A, please log into the web site [email or web address] and access using universal [place] password for that web site. If you do not have the password, please call the hospital operator.  09/10/2021, 8:16 PM

## 2021-09-10 NOTE — Progress Notes (Signed)
[place] for Infectious Disease    Date of Admission:  08/29/2021   Total days of antibiotics 13/cefazolin plus dapto          ID: [name] is a 51 y.o. 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 Principal Problem:   Diabetic ulcer of left foot associated with type 2 diabetes mellitus ([place]) Active Problems:   Essential hypertension   Coronary atherosclerosis of native coronary artery   Osteomyelitis of foot, acute ([name])   Anemia in chronic kidney disease (CKD)   Type 2 diabetes mellitus with hyperglycemia ([name])   Acute kidney injury superimposed on CKD ([place])   Mixed hyperlipidemia   Severe protein-calorie malnutrition ([name])   Transaminitis   CKD (chronic kidney disease), stage IV ([name])   Dehiscence of amputation stump of left lower extremity ([name])   Acute postoperative anemia due to expected blood loss    Subjective: Afebrile but still having some leg pain at amputation site of left aka.  Labs still showing leukocytosis of 25K  Medications:   amLODipine  10 mg Oral Daily   vitamin C  1,000 mg Oral Daily   aspirin EC  81 mg Oral Daily   atorvastatin  20 mg Oral Daily   carvedilol  25 mg Oral BID WC   Chlorhexidine Gluconate Cloth  6 each Topical Daily   docusate sodium  100 mg Oral Daily   feeding supplement  237 mL Oral TID BM   ferrous sulfate  325 mg Oral Q breakfast   gabapentin  300 mg Oral QHS   heparin injection (subcutaneous)  5,000 Units Subcutaneous Q8H   hydrALAZINE  25 mg Oral Q8H   insulin aspart  0-15 Units Subcutaneous TID WC   insulin glargine-yfgn  10 Units Subcutaneous Daily   multivitamin with minerals  1 tablet Oral Daily   nutrition supplement (JUVEN)  1 packet Oral BID BM   pantoprazole  40 mg Oral Daily   zinc sulfate  220 mg Oral Daily    Objective: Vital signs in last 24 hours: Temp:  [97.5 F (36.4 C)-98.4 F (36.9 C)] 97.8 F (36.6 C) (06/15 0813) Pulse Rate:  [73-81] 81 (06/15  0813) Resp:  [16-18] 16 (06/15 0813) BP: (141-156)/(87-94) 156/88 (06/15 0813) SpO2:  [100 %] 100 % (06/15 0813)  Physical Exam  Constitutional: He is oriented to person, place, and time. He appears well-developed and well-nourished. No distress.  HENT:  Mouth/Throat: Oropharynx is clear and moist. No oropharyngeal exudate.  Cardiovascular: Normal rate, regular rhythm and normal heart sounds. Exam reveals no gallop and no friction rub.  No murmur heard.  Pulmonary/Chest: Effort normal and breath sounds normal. No respiratory distress. He has no wheezes.  Abdominal: Soft. Bowel sounds are normal. He exhibits no distension. There is no tenderness.  XHB:ZJIR aka, wrapped Neurological: He is alert and oriented to person, place, and time.  Skin: Skin is warm and dry. No rash noted. No erythema.  Psychiatric: He has a normal mood and affect. His behavior is normal.    Lab Results Recent Labs    09/09/21 0430 09/10/21 0450  WBC 24.4* 25.1*  HGB 9.2* 9.7*  HCT 28.1* 30.5*  NA 141 140  K 4.7 4.7  CL 106 107  CO2 27 24  BUN 48* 44*  CREATININE 2.48* 2.42*   Liver Panel Recent Labs    09/09/21 0430 09/10/21 0450  ALBUMIN 2.6* 2.8*   Lab Results  Component Value Date   ESRSEDRATE 75 (H) 09/03/2021    Microbiology: reviewed Studies/Results: CT FEMUR LEFT WO CONTRAST  Result Date: 09/10/2021 CLINICAL DATA:  Osteomyelitis suspected, femur, no prior imaging. Above knee amputation on 09/04/2021 EXAM: CT OF THE LOWER LEFT EXTREMITY WITHOUT CONTRAST TECHNIQUE: Multidetector CT imaging of the lower left extremity was performed according to the standard protocol. RADIATION DOSE REDUCTION: This exam was performed according to the departmental dose-optimization program which includes automated exposure control, adjustment of the mA and/or kV according to patient size and/or use of iterative reconstruction technique. COMPARISON:  None Available. FINDINGS: Bones/Joint/Cartilage Postsurgical  changes from recent above knee amputation of the left femur. Sharp amputation margin without bony irregularity or erosion. Remainder of the left femur is normal in appearance. Mild joint space narrowing of the left hip. No appreciable hip joint effusion. No dislocation. No evidence of femoral head avascular necrosis. Included portion of the left hemipelvis is intact. Ligaments Suboptimally assessed by CT. Muscles and Tendons Amputation changes of the distal thigh. Otherwise no acute musculotendinous abnormality. Soft tissues Minimal edema at the distal stump where there is overlying skin staples. No organized fluid collection. Mild subcutaneous edema along the anterolateral aspect of the left hip. No soft tissue gas. Mildly prominent left inguinal lymph nodes, likely reactive. IMPRESSION: 1. Postsurgical changes from recent above-the-knee amputation of the left femur. No evidence of osteomyelitis. 2. Mild subcutaneous edema along the anterolateral aspect of the left hip, nonspecific but could represent cellulitis in the appropriate clinical setting. Electronically Signed   By: Davina Poke D.O.   On: 09/10/2021 13:40   CT PELVIS WO CONTRAST  Result Date: 09/10/2021 CLINICAL DATA:  Osteomyelitis suspected, pelvis, no prior imaging EXAM: CT PELVIS WITHOUT CONTRAST TECHNIQUE: Multidetector CT imaging of the pelvis was performed following the standard protocol without intravenous contrast. RADIATION DOSE REDUCTION: This exam was performed according to the departmental dose-optimization program which includes automated exposure control, adjustment of the mA and/or kV according to patient size and/or use of iterative reconstruction technique. COMPARISON:  May 14, 2020 FINDINGS: Urinary Tract:  No abnormality visualized. Bowel:  Unremarkable visualized pelvic bowel loops. Vascular/Lymphatic: No pathologically enlarged lymph nodes. No significant vascular abnormality seen. Reproductive:  No mass or other  significant abnormality Other:  None. Musculoskeletal: No suspicious bone lesions identified. No evidence of osteomyelitis. Moderate spondylosis most prominent at L5-S1. At L5-S1, there is decrease in the height of the disc space, vacuum disc phenomenon and prominent marginal osteophytes seen. There is some subcutaneous edema seen at the bilateral flanks extending into the lateral aspect of the hip and proximal thigh areas. IMPRESSION: Bony pelvis has a normal appearance without evidence of osteomyelitis. Moderate spondylosis in the lumbar spine and most prominent at L5-S1. At L5-S1, there is decrease in the height of the disc space, vacuum disc phenomenon and prominent marginal osteophytes seen. Electronically Signed   By: Frazier Richards M.D.   On: 09/10/2021 13:22     Assessment/Plan: Hx of left leg osteomyelitis, polymicrobial -dfu ulcer s/p left bka, then revised to aka, currently on cefazolin, daptomycin to cover proteus and enterococcus + cultures. Last surgery was 6/9. Recommend to finish out 7 days post surgery. Last of abtx is tomorrow. Will check sed rate  Ongoing leukocytosis = concern for another source of infection --left thigh/hip CT did not reveal abscess or signs of septic arthritis. Recommend to monitor off of abtx. Will add differential on cbc  Hx of MSSA MV endocarditis = in late January. Recommend  to repeat TTE  [facility] Infectious [facility] Pager: [phone]  09/10/2021, 4:23 PM

## 2021-09-10 NOTE — Assessment & Plan Note (Signed)
-   Patient with persistent leukocytosis white count of 25.1. -Patient on IV antibiotics to complete a 7-day course per ID recommendations. -Patient status post AKA. -CT left hip and pelvis/CT left femur negative for any abscess or septic arthritis. 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 -Finish out 7-day course of IV antibiotics as recommended by ID. -ID following and appreciate input and recommendations.

## 2021-09-11 ENCOUNTER — Inpatient Hospital Stay (HOSPITAL_COMMUNITY): Payer: Medicare Other

## 2021-09-11 DIAGNOSIS — I38 Endocarditis, valve unspecified: Secondary | ICD-10-CM | POA: Diagnosis not present

## 2021-09-11 DIAGNOSIS — D72828 Other elevated white blood cell count: Secondary | ICD-10-CM

## 2021-09-11 DIAGNOSIS — L97429 Non-pressure chronic ulcer of left heel and midfoot with unspecified severity: Secondary | ICD-10-CM | POA: Diagnosis not present

## 2021-09-11 DIAGNOSIS — Z89612 Acquired absence of left leg above knee: Secondary | ICD-10-CM

## 2021-09-11 DIAGNOSIS — E11621 Type 2 diabetes mellitus with foot ulcer: Secondary | ICD-10-CM | POA: Diagnosis not present

## 2021-09-11 LAB — CBC WITH DIFFERENTIAL/PLATELET
Abs Immature Granulocytes: 0.17 10*3/uL — ABNORMAL HIGH (ref 0.00–0.07)
Basophils Absolute: 0 10*3/uL (ref 0.0–0.1)
Basophils Relative: 0 %
Eosinophils Absolute: 0.3 10*3/uL (ref 0.0–0.5)
Eosinophils Relative: 2 %
HCT: 28.3 % — ABNORMAL LOW (ref 39.0–52.0)
Hemoglobin: 9 g/dL — ABNORMAL LOW (ref 13.0–17.0)
Immature Granulocytes: 1 %
Lymphocytes Relative: 13 %
Lymphs Abs: 2.7 10*3/uL (ref 0.7–4.0)
MCH: 28.2 pg (ref 26.0–34.0)
MCHC: 31.8 g/dL (ref 30.0–36.0)
MCV: 88.7 fL (ref 80.0–100.0)
Monocytes Absolute: 0.9 10*3/uL (ref 0.1–1.0)
Monocytes Relative: 4 %
Neutro Abs: 16.2 10*3/uL — ABNORMAL HIGH (ref 1.7–7.7)
Neutrophils Relative %: 80 %
Platelets: 349 10*3/uL (ref 150–400)
RBC: 3.19 MIL/uL — ABNORMAL LOW (ref 4.22–5.81)
RDW: 16.1 % — ABNORMAL HIGH (ref 11.5–15.5)
WBC: 20.3 10*3/uL — ABNORMAL HIGH (ref 4.0–10.5)
nRBC: 0 % (ref 0.0–0.2)

## 2021-09-11 LAB — GLUCOSE, CAPILLARY
Glucose-Capillary: 136 mg/dL — ABNORMAL HIGH (ref 70–99)
Glucose-Capillary: 88 mg/dL (ref 70–99)
Glucose-Capillary: 94 mg/dL (ref 70–99)
Glucose-Capillary: 102 mg/dL — ABNORMAL HIGH (ref 70–99)
Glucose-Capillary: 103 mg/dL — ABNORMAL HIGH (ref 70–99)

## 2021-09-11 LAB — ECHOCARDIOGRAM COMPLETE
Area-P 1/2: 3.77 cm2
Height: 64 in
S' Lateral: 2.3 cm
Single Plane A4C EF: 47.9 %
Weight: 3120 oz

## 2021-09-11 LAB — RENAL FUNCTION PANEL
Albumin: 2.6 g/dL — ABNORMAL LOW (ref 3.5–5.0)
Anion gap: 8 (ref 5–15)
BUN: 36 mg/dL — ABNORMAL HIGH (ref 6–20)
CO2: 25 mmol/L (ref 22–32)
Calcium: 8.5 mg/dL — ABNORMAL LOW (ref 8.9–10.3)
Chloride: 106 mmol/L (ref 98–111)
Creatinine, Ser: 2.56 mg/dL — ABNORMAL HIGH (ref 0.61–1.24)
GFR, Estimated: 29 mL/min — ABNORMAL LOW (ref >60)
Glucose, Bld: 98 mg/dL (ref 70–99)
Phosphorus: 3.1 mg/dL (ref 2.5–4.6)
Potassium: 4.6 mmol/L (ref 3.5–5.1)
Sodium: 139 mmol/L (ref 135–145)

## 2021-09-11 LAB — CK: Total CK: 41 U/L — ABNORMAL LOW (ref 49–397)

## 2021-09-11 NOTE — Progress Notes (Signed)
[place] for Infectious Disease  Date of Admission:  08/29/2021      Total days of antibiotics 14  Dapto + cefazolin         ASSESSMENT: [name] is a 51 y.o. male admitted with left leg OM s/p BKA that was revised to AKA given worsening purulence and concern for residual OM.   LLE Osteomyelitis - now with definitive surgical intervention with AKA.   Leukocytosis - baseline elevated 12-15K from reviewing. He had an aggressive leukemoid response with his severe infection (50K) and has since been slow to respond despite good source control and no alternative causes of infection identified. We discussed that after he recovers from current problem there may be value to a hematology evaluation outpatient to evaluate persistently elevated wbc counts.   H/O MSSA MV Endocarditis - January 2023 treatment. Repeat TTE pending. Not bacteremic during this hospital stay.   Moisture Associated Skin damage - has urinary bag in place now to help with this. Continue topical barrier cream for skin protection.     PLAN: Elizebeth Koller out day 7 antibiotics today then monitor off Please D/C central line after today's doses of antibiotics Would consider Heme consult outpatient to eval baseline leukocytosis.    Principal Problem:   Diabetic ulcer of left foot associated with type 2 diabetes mellitus ([place]) Active Problems:   Essential hypertension   Coronary atherosclerosis of native coronary artery   Osteomyelitis of foot, acute ([name])   Anemia in chronic kidney disease (CKD)   Leukocytosis   Type 2 diabetes mellitus with hyperglycemia ([name])   Acute kidney injury superimposed on CKD ([name])   Mixed hyperlipidemia   Severe protein-calorie malnutrition ([name])   Transaminitis   CKD (chronic kidney disease), stage IV ([name])   Dehiscence of amputation stump of left lower extremity ([name])   Acute postoperative anemia due to expected blood loss    amLODipine  10 mg Oral Daily   vitamin  C  1,000 mg Oral Daily   aspirin EC  81 mg Oral Daily   atorvastatin  20 mg Oral Daily   carvedilol  25 mg Oral BID WC   Chlorhexidine Gluconate Cloth  6 each Topical Daily   docusate sodium  100 mg Oral Daily   feeding supplement  237 mL Oral TID BM   ferrous sulfate  325 mg Oral Q breakfast   gabapentin  300 mg Oral QHS   heparin injection (subcutaneous)  5,000 Units Subcutaneous Q8H   hydrALAZINE  25 mg Oral Q8H   insulin aspart  0-15 Units Subcutaneous TID WC   insulin glargine-yfgn  10 Units Subcutaneous Daily   multivitamin with minerals  1 tablet Oral Daily   nutrition supplement (JUVEN)  1 packet Oral BID BM   pantoprazole  40 mg Oral Daily   zinc sulfate  220 mg Oral Daily    SUBJECTIVE: Doing well. No complaints today outside of poor tasting food. Appetite is better and feeling better. Discussed dispo for SNF recommendations. He thinks he may be going to [facility] but not sure as he has not had an update in a few days.  Tolerating abx well without any side effects to his knowledge.    Review of Systems: Review of Systems  Constitutional:  Negative for chills and fever.  Respiratory:  Negative for sputum production, shortness of breath and wheezing.   Cardiovascular:  Negative for chest pain.  Gastrointestinal:  Negative for abdominal pain, nausea  and vomiting.  Genitourinary:  Negative for dysuria.  Musculoskeletal:  Negative for back pain.  Skin:  Negative for rash.  Neurological:  Negative for dizziness.    Allergies  Allergen Reactions   Bee Venom Swelling    SWELLING REACTION UNSPECIFIED    Penicillins Hives and Other (See Comments)    Full body hives as a child with no shortness of breath or swelling  **Can tolerate cephalosporins**   Clonidine Derivatives Other (See Comments)    Pt states "It is making me dizzy"    OBJECTIVE: Vitals:   09/10/21 1630 09/10/21 2018 09/11/21 0423 09/11/21 0857  BP: (!) 151/87 140/78 (!) 142/78 139/80  Pulse: 80 77 74  72  Resp: '16 17 16 16  '$ Temp: 97.9 F (36.6 C) 98 F (36.7 C) 98.3 F (36.8 C) 97.8 F (36.6 C)  TempSrc: Oral Oral Oral Oral  SpO2: 100% 100% 100% 99%  Weight:      Height:       Body mass index is 33.47 kg/m.  Physical Exam Vitals reviewed.  Constitutional:      Appearance: Normal appearance. He is not ill-appearing.  Cardiovascular:     Rate and Rhythm: Normal rate and regular rhythm.  Pulmonary:     Effort: Pulmonary effort is normal.  Abdominal:     General: Bowel sounds are normal.     Palpations: Abdomen is soft.  Musculoskeletal:        General: No swelling.     Comments: RLE with well healed TM amputation site LLE AKA with vac in place overlying incision. Periwound is intact without signs of local infection. No drainage in canister or tubing.   Skin:    Capillary Refill: Capillary refill takes less than 2 seconds.     Comments: Moisture associated skin damage to intertriginous and sacral area  Neurological:     Mental Status: He is alert and oriented to person, place, and time.     Lab Results Lab Results  Component Value Date   WBC 20.3 (H) 09/11/2021   HGB 9.0 (L) 09/11/2021   HCT 28.3 (L) 09/11/2021   MCV 88.7 09/11/2021   PLT 349 09/11/2021    Lab Results  Component Value Date   CREATININE 2.56 (H) 09/11/2021   BUN 36 (H) 09/11/2021   NA 139 09/11/2021   K 4.6 09/11/2021   CL 106 09/11/2021   CO2 25 09/11/2021    Lab Results  Component Value Date   ALT 19 09/04/2021   AST 23 09/04/2021   ALKPHOS 97 09/04/2021   BILITOT 0.7 09/04/2021     Microbiology: Recent Results (from the past 240 hour(s))  Surgical PCR screen     Status: None   Collection Time: 09/02/21  5:30 AM   Specimen: Nasal Mucosa; Nasal Swab  Result Value Ref Range Status   MRSA, PCR NEGATIVE NEGATIVE Final   Staphylococcus aureus NEGATIVE NEGATIVE Final    Comment: (NOTE) The Xpert SA Assay (FDA approved for NASAL specimens in patients 52 years of age and older), is  one component of a comprehensive surveillance program. It is not intended to diagnose infection nor to guide or monitor treatment. Performed at [facility], [place] [address], [place], [place] [address]   Aerobic/Anaerobic Culture w Gram Stain (surgical/deep wound)     Status: None   Collection Time: 09/02/21  3:07 PM   Specimen: Soft Tissue, Other  Result Value Ref Range Status   Specimen Description WOUND  Final  Special Requests LEFT LEG PUS  Final   Gram Stain   Final    MODERATE WBC PRESENT,BOTH PMN AND MONONUCLEAR FEW GRAM NEGATIVE RODS FEW GRAM POSITIVE COCCI IN PAIRS FEW GRAM POSITIVE COCCI IN CLUSTERS Performed at [facility], [place] [address] [place], [place] [address]    Culture   Final    ABUNDANT PROTEUS MIRABILIS ABUNDANT ENTEROCOCCUS FAECALIS MIXED ANAEROBIC FLORA PRESENT.  CALL LAB IF FURTHER IID REQUIRED.    Report Status 09/07/2021 FINAL  Final   Organism ID, Bacteria PROTEUS MIRABILIS  Final   Organism ID, Bacteria ENTEROCOCCUS FAECALIS  Final      Susceptibility   Enterococcus faecalis - MIC*    AMPICILLIN <=2 SENSITIVE Sensitive     VANCOMYCIN 1 SENSITIVE Sensitive     GENTAMICIN SYNERGY SENSITIVE Sensitive     * ABUNDANT ENTEROCOCCUS FAECALIS   Proteus mirabilis - MIC*    AMPICILLIN <=2 SENSITIVE Sensitive     CEFAZOLIN <=4 SENSITIVE Sensitive     CEFEPIME <=0.12 SENSITIVE Sensitive     CEFTAZIDIME <=1 SENSITIVE Sensitive     CEFTRIAXONE <=0.25 SENSITIVE Sensitive     CIPROFLOXACIN <=0.25 SENSITIVE Sensitive     GENTAMICIN <=1 SENSITIVE Sensitive     IMIPENEM 2 SENSITIVE Sensitive     TRIMETH/SULFA <=20 SENSITIVE Sensitive     AMPICILLIN/SULBACTAM <=2 SENSITIVE Sensitive     PIP/TAZO <=4 SENSITIVE Sensitive     * ABUNDANT PROTEUS MIRABILIS     Janene Madeira, MSN, NP-C [facility] [email or web address] Pager: [phone] Office: [phone] [facility] Main Line:  [place] Communication Welcome

## 2021-09-11 NOTE — Progress Notes (Signed)
  Echocardiogram 2D Echocardiogram has been performed.  [name] 09/11/2021, 4:26 PM

## 2021-09-11 NOTE — TOC Progression Note (Signed)
Transition of Care [facility]) - Progression Note    Patient Details  Name: [name] MRN: [id number] Date of Birth: [birth year]  Transition of Care [facility]) CM/SW [place], [place] Phone Number: 09/11/2021, 12:14 PM  Clinical Narrative:    CSW started auth for [facility]. [facility] noted if it gets approved pt may be able to go over the weekend. TOC will continue to follow for DC planning.   Expected Discharge Plan: [place] Barriers to Discharge: Continued Medical Work up  Expected Discharge Plan and Services Expected Discharge Plan: [facility] In-house Referral: Clinical Social Work   Post Acute Care Choice: [place]                                         Social Determinants of Health (SDOH) Interventions    Readmission Risk Interventions     No data to display

## 2021-09-11 NOTE — Progress Notes (Signed)
Physical Therapy Treatment Patient Details Name: [name] MRN: [id number] DOB: October 09, 1970 Today's Date: 09/11/2021   History of Present Illness Pt is a 51 yr old male who who presented 08/29/21 due to L foot infection, found to have osteo of the cuboid and fifth metatarsal. 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 PMH: allergies, iron deficiency anemia, 3B stage CKD, CAD, constipation, type II DM, glaucoma, hyperlipidemia, hypertension, diabetic peripheral neuropathy, diabetic retinopathy, tobacco use, multiple lower extremities I&D, rt foot chopart amputation    PT Comments    Pt with MASD of perineal area and buttocks causing significant pain and preventing attempts at scooting or sliding board transfer. Was able to stand briefly with walker and 2 person assist. Pt remains motivated to work toward more independence. Continue to recommend SNF for further rehab.     Recommendations for follow up therapy are one component of a multi-disciplinary discharge planning process, led by the attending physician.  Recommendations may be updated based on patient status, additional functional criteria and insurance authorization.  Follow Up Recommendations  Skilled nursing-short term rehab (<3 hours/day) (insurance will not approve AIR)     Assistance Recommended at Discharge Frequent or constant Supervision/Assistance  Patient can return home with the following A lot of help with walking and/or transfers;A little help with bathing/dressing/bathroom;Assistance with cooking/housework;Assist for transportation;Help with stairs or ramp for entrance   Equipment Recommendations  Other (comment) (tub bench, slide board)    Recommendations for Other Services       Precautions / Restrictions Precautions Precautions: Fall Precaution Comments: wound vac L LE, previous R foot amputation Restrictions Weight Bearing Restrictions: Yes LLE Weight Bearing: Non weight bearing      Mobility  Bed Mobility Overal bed mobility: Needs Assistance Bed Mobility: Supine to Sit, Sit to Supine     Supine to sit: Min assist Sit to supine: Min assist   General bed mobility comments: Assist to elevate trunk into sitting    Transfers Overall transfer level: Needs assistance Equipment used: Rolling walker (2 wheels) Transfers: Sit to/from Stand Sit to Stand: +2 physical assistance, Mod assist, From elevated surface           General transfer comment: Assist to bring hips up and for balance. Pt placed both hands on walker to encourage anterior wt shift. Assist to lower hips onto bed and prevent hips from sliding anteriorly off EOB.    Ambulation/Gait             Pre-gait activities: Stood with walker x 30 sec with +2 min assist     Stairs             Wheelchair Mobility    Modified Rankin (Stroke Patients Only)       Balance   Sitting-balance support: No upper extremity supported Sitting balance-Leahy Scale: Fair     Standing balance support: Bilateral upper extremity supported Standing balance-Leahy Scale: Poor Standing balance comment: walker and +2 min assist for static standing                            Cognition Arousal/Alertness: Awake/alert Behavior During Therapy: WFL for tasks assessed/performed Overall Cognitive Status: Within Functional Limits for tasks assessed  Exercises      General Comments        Pertinent Vitals/Pain Pain Assessment Pain Assessment: Faces Faces Pain Scale: Hurts whole lot Pain Location: buttocks, perineal area, L residual limb Pain Descriptors / Indicators: Sore, Grimacing, Guarding Pain Intervention(s): Limited activity within patient's tolerance, Repositioned, Premedicated before session, Monitored during session    Home Living                          Prior Function            PT Goals (current goals  can now be found in the care plan section) Acute Rehab PT Goals Patient Stated Goal: to go home Progress towards PT goals: Not progressing toward goals - comment (Painful buttocks and perineal area)    Frequency    Min 3X/week      PT Plan Current plan remains appropriate    Co-evaluation              AM-PAC PT "6 Clicks" Mobility   Outcome Measure  Help needed turning from your back to your side while in a flat bed without using bedrails?: None Help needed moving from lying on your back to sitting on the side of a flat bed without using bedrails?: A Little Help needed moving to and from a bed to a chair (including a wheelchair)?: A Lot Help needed standing up from a chair using your arms (e.g., wheelchair or bedside chair)?: Total Help needed to walk in hospital room?: Total Help needed climbing 3-5 steps with a railing? : Total 6 Click Score: 12    End of Session   Activity Tolerance: No increased pain Patient left: in bed;with call bell/phone within reach;with bed alarm set Nurse Communication: Mobility status PT Visit Diagnosis: Unsteadiness on feet (R26.81);Muscle weakness (generalized) (M62.81);Difficulty in walking, not elsewhere classified (R26.2);Pain Pain - Right/Left: Left Pain - part of body: Leg     Time: 1130-1144 PT Time Calculation (min) (ACUTE ONLY): 14 min  Charges:  $Therapeutic Activity: 8-22 mins                     [place] [facility] [place] 09/11/2021, 2:03 PM

## 2021-09-11 NOTE — Progress Notes (Signed)
Nutrition Follow-up  DOCUMENTATION CODES:  Obesity unspecified  INTERVENTION:  Encourage adequate PO intake Continue carb modified diet with ordering assist Ensure Enlive po TID, each supplement provides 350 kcal and 20 grams of protein. (Strawberry and vanilla) 1 packet Juven BID, each packet provides 95 calories, 2.5 grams of protein (collagen), and 9.8 grams of carbohydrate (3 grams sugar); + vitamins and minerals to support wound healing MVI with minerals daily  NUTRITION DIAGNOSIS:  Increased nutrient needs related to acute illness, wound healing as evidenced by estimated needs. - remains applicable  GOAL:  Patient will meet greater than or equal to 90% of their needs - progressing, supplements in place  MONITOR:  PO intake, Supplement acceptance, Labs, Weight trends, Diet advancement  REASON FOR ASSESSMENT:  Consult Wound healing  ASSESSMENT:  Pt admitted d/t fall. Found to have diabetic ulcer/acute osteomyelitis of L foot d/t T2DM. PMH significant for seasonal allergies, iron deficiency anemia, CKD stage 3B, CAD, constipation, T2DM, glaucoma, HLD, HTN, diabetic peripheral neuropathy, diabetic retinopathy, tobacco use, multiple lower extremity I&D and amputations.  6/7 - Op, left BKA 6/9 - Placement of right IJ for long term antibiotics 6/9 - Op, left AKA with wound vac application  Pt resting in bed at the time of assessment, breakfast tray at bedside. Pt reports that his appetite is so-so but does endorse he has been consuming more of his meals now that he is on ordering assist. Continues to have good intake of ensure.  Average Meal Intake: 6/8-6/12: 25% average intake x 3 recorded meals  Nutritionally Relevant Medications: Scheduled Meds:  vitamin C  1,000 mg Oral Daily   atorvastatin  20 mg Oral Daily   docusate sodium  100 mg Oral Daily   feeding supplement  237 mL Oral TID BM   ferrous sulfate  325 mg Oral Q breakfast   insulin aspart  0-15 Units Subcutaneous  TID WC   insulin glargine-yfgn  10 Units Subcutaneous Daily   multivitamin with minerals  1 tablet Oral Daily   JUVEN  1 packet Oral BID BM   pantoprazole  40 mg Oral Daily   zinc sulfate  220 mg Oral Daily   Continuous Infusions:   ceFAZolin (ANCEF) IV 2 g (09/10/21 2330)   DAPTOmycin (CUBICIN) 550 mg in sodium chloride 0.9 % IVPB 550 mg (09/09/21 1941)   magnesium sulfate bolus IVPB     PRN Meds: alum & mag hydroxide-simeth, bisacodyl, magnesium citrate, magnesium sulfate bolus IVPB, ondansetron, phenol, polyethylene glycol, potassium chloride, senna  Labs Reviewed: BUN 61, creatinine 2.77  NUTRITION - FOCUSED PHYSICAL EXAM: Flowsheet Row Most Recent Value  Orbital Region No depletion  Upper Arm Region No depletion  Thoracic and Lumbar Region No depletion  Buccal Region No depletion  Temple Region No depletion  Clavicle Bone Region No depletion  Clavicle and Acromion Bone Region No depletion  Scapular Bone Region No depletion  Dorsal Hand No depletion  Patellar Region No depletion  Anterior Thigh Region No depletion  Posterior Calf Region No depletion  Edema (RD Assessment) None  Hair Reviewed  Eyes Reviewed  Mouth Reviewed  Skin Reviewed  Nails Reviewed   Diet Order:   Diet Order             Diet Carb Modified Fluid consistency: Thin; Room service appropriate? Yes with Assist  Diet effective now                   EDUCATION NEEDS:  Education needs have been  addressed  Skin:  Skin Assessment: Skin Integrity Issues: Skin Integrity Issues:: Diabetic Ulcer, Incisions Diabetic Ulcer: L foot (open/dehisced) Incisions: L foot (closed)  Last BM:  6/11  Height:  Ht Readings from Last 1 Encounters:  09/04/21 '5\' 4"'$  (1.626 m)   Weight:  Wt Readings from Last 1 Encounters:  09/04/21 88.5 kg   Ideal Body Weight:  59.1 kg  BMI:  Body mass index is 33.47 kg/m.  Estimated Nutritional Needs:  Kcal:  1800-2000 Protein:  90-105g Fluid:  >/=1.8L   Ranell Patrick, RD, LDN Clinical Dietitian RD pager # available in [facility]  After hours/weekend pager # available in [facility]

## 2021-09-11 NOTE — Progress Notes (Signed)
PROGRESS NOTE  [name]  DOB: [birth year]  PCP: Camillia Herter, NP ZOX:[id number]  DOA: 08/29/2021  LOS: 58 days  Hospital Day: 14  Brief narrative: [name] is a 51 y.o. 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 Patient presented to the ED on 08/29/2021 with complaint of LEFT foot pain, draining wound.  Initial labs showed creatinine elevated to 5.2 against a baseline of 2.5. MRI left foot showed osteomyelitis of the cuboid and fifth metatarsal.  Podiatry consult obtained.  No salvage operations were just possible. 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 See below for details  Subjective: Patient was seen and examined this morning. Middle-aged African-American male.  Lying on bed.  Not in distress No new symptoms. No fever.  WC count improving.  Underwent echocardiogram today, did not show any evidence of vegetation.  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 Intra-Op, patient was noted to have purulent abscess that extended up to the popliteal fossa with necrotic muscle along the medial head of the gastrocnemius muscle and medial soleus.   -6/9, patient was taken back to  the OR.  He was noted to have purulence draining from below the knee amputation and hence BKA was converted to AKA.  Margins are noted to be clean. -6/9, underwent tunneled central line placement by IR. -Per ID recommendation, patient was treated with daptomycin and cefazolin for 7 days postsurgery.  Last dose of antibiotics tomorrow 6/17.  Persistent leukocytosis -WBC count remains elevated for last several days.  Down to 20,000 today from 25,000 yesterday.  No fever.  Concern for other source of infection.  Left thigh/hip CT scan did not show any abscess or signs of septic arthritis.  Patient completed a course of daptomycin and cefazolin today.  Continue to monitor off antibiotics. -Obtain sed rate. -Continue to monitor leukocytes. 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  TTE recommended today did not show any evidence of vegetation.  Acute kidney injury superimposed on CKD 4 -Creatinine was elevated up to 5.2 on admission, basleine around 2.5-2.8 since Jan. -Nephrology consultation was obtained.  Patient was started on bicarb drip.  Renal function gradually improved, slightly up today to 2.56 compared to 2.4 yesterday.  Continue to monitor. -To follow-up with nephrologist Dr. [name] after discharge. Recent Labs    09/02/21 0212 09/03/21 0149 09/04/21 0755 09/05/21 0142 09/06/21 0053 09/07/21 0505 09/08/21 0436 09/09/21 0430 09/10/21 0450 09/11/21 0457  BUN 113* 112* 97* 85* 73* 61* 55* 48* 44* 36*  CREATININE 3.78* 3.69* 3.36* 3.01* 2.93*  2.77* 2.65* 2.48* 2.42* 2.56*   Acute postoperative anemia due to expected blood loss - Postoperatively hemoglobin trended down to the lowest of 6.6 on 6/12.  2 units PRBC transfused.  Hemoglobin is now stable above 9.  No active bleeding.  Continue to monitor. Recent Labs    10/14/20 1428 10/17/20 1511  01/13/21 0938 01/13/21 0939 09/06/21 0945 09/07/21 0505 09/07/21 1820 09/08/21 0436 09/09/21 0430 09/10/21 0450 09/11/21 0457  HGB  --    < >  --    < >  --    < > 9.6* 9.3* 9.2* 9.7* 9.0*  MCV  --    < >  --    < >  --    < >  --  86.0 87.5 88.4 88.7  VITAMINB12  --   --   --   --  685  --   --   --   --   --   --   FOLATE 7.1  --  8.1  --  7.5  --   --   --   --   --   --   FERRITIN  --   --  159  --  525*  --   --   --   --   --   --   TIBC  --   --  269  --  NOT CALCULATED  --   --   --   --   --   --   IRON  --   --  54  --  75  --   --   --   --   --   --    < > = values in this interval not displayed.   Type 2 diabetes mellitus -A1c 7.5 on 6/4 -Currently on Lantus 10 units daily, sliding scale insulin with Accu-Cheks Recent Labs  Lab 09/10/21 1629 09/10/21 2020 09/11/21 0852 09/11/21 1215 09/11/21 1641  GLUCAP 153* 156* 88 94 102*   Essential hypertension -Currently blood pressure controlled with amlodipine, Coreg, hydralazine -Clonidine remain on hold.  CAD/HLD -Continue aspirin, beta-blocker statin  Severe protein-calorie malnutrition -Albumin was at  < 1.5. -IV albumin was given.  Dietitian consulted. -continue nutritional supplementation with Ensure.    Goals of care   Code Status: Full Code    Mobility: Encourage ambulation.  SNF recommended PT  Skin assessment:     Nutritional status:  Body mass index is 33.47 kg/m.  Nutrition Problem: Increased nutrient needs Etiology: acute illness, wound healing Signs/Symptoms: estimated needs     Diet:  Diet Order             Diet Carb Modified Fluid consistency: Thin; Room service appropriate? Yes with Assist  Diet effective now                   DVT prophylaxis:  heparin injection 5,000 Units Start: 09/09/21 1400 SCD's Start: 09/04/21 1633 SCD's Start: 09/02/21 1641   Antimicrobials: To complete cefazolin daptomycin tomorrow 6/17. Fluid: None Consultants: ID, orthopedics Family  Communication: None at bedside  Status is: Inpatient  Continue in-hospital care because: Requiring IV antibiotics, persistent leukocytosis Level of care: Med-Surg   Dispo: The patient is from: Home              Anticipated d/c is to: SNF              Patient currently is not medically stable to d/c.   Difficult to place  patient No     Infusions:    ceFAZolin (ANCEF) IV 2 g (09/11/21 0949)   DAPTOmycin (CUBICIN) 550 mg in sodium chloride 0.9 % IVPB 550 mg (09/09/21 1941)   magnesium sulfate bolus IVPB      Scheduled Meds:  amLODipine  10 mg Oral Daily   vitamin C  1,000 mg Oral Daily   aspirin EC  81 mg Oral Daily   atorvastatin  20 mg Oral Daily   carvedilol  25 mg Oral BID WC   Chlorhexidine Gluconate Cloth  6 each Topical Daily   docusate sodium  100 mg Oral Daily   feeding supplement  237 mL Oral TID BM   ferrous sulfate  325 mg Oral Q breakfast   gabapentin  300 mg Oral QHS   heparin injection (subcutaneous)  5,000 Units Subcutaneous Q8H   hydrALAZINE  25 mg Oral Q8H   insulin aspart  0-15 Units Subcutaneous TID WC   insulin glargine-yfgn  10 Units Subcutaneous Daily   multivitamin with minerals  1 tablet Oral Daily   nutrition supplement (JUVEN)  1 packet Oral BID BM   pantoprazole  40 mg Oral Daily   zinc sulfate  220 mg Oral Daily    PRN meds: acetaminophen, alum & mag hydroxide-simeth, bisacodyl, guaiFENesin-dextromethorphan, hydrALAZINE, HYDROmorphone (DILAUDID) injection, labetalol, magnesium citrate, magnesium sulfate bolus IVPB, metoprolol tartrate, ondansetron, ondansetron **OR** [DISCONTINUED] ondansetron (ZOFRAN) IV, oxyCODONE, oxyCODONE, phenol, polyethylene glycol, potassium chloride, senna, sodium chloride flush   Antimicrobials: Anti-infectives (From admission, onward)    Start     Dose/Rate Route Frequency Ordered Stop   09/06/21 0745  ceFAZolin (ANCEF) IVPB 2g/100 mL premix        2 g 200 mL/hr over 30 Minutes Intravenous Every 8 hours 09/06/21  0733 09/12/21 2359   09/05/21 0600  ceFAZolin (ANCEF) IVPB 2g/100 mL premix        2 g 200 mL/hr over 30 Minutes Intravenous On call to O.R. 09/04/21 1214 09/04/21 1504   09/04/21 1730  ceFAZolin (ANCEF) IVPB 2g/100 mL premix  Status:  Discontinued        2 g 200 mL/hr over 30 Minutes Intravenous Every 8 hours 09/04/21 1632 09/04/21 1745   09/03/21 2000  DAPTOmycin (CUBICIN) 550 mg in sodium chloride 0.9 % IVPB        8 mg/kg  70.5 kg (Adjusted) 122 mL/hr over 30 Minutes Intravenous Every 48 hours 09/03/21 0945 09/12/21 2359   09/03/21 1045  ceFAZolin (ANCEF) IVPB 2g/100 mL premix  Status:  Discontinued        2 g 200 mL/hr over 30 Minutes Intravenous Every 12 hours 09/03/21 0945 09/06/21 0733   09/02/21 1100  ceFAZolin (ANCEF) IVPB 2g/100 mL premix        2 g 200 mL/hr over 30 Minutes Intravenous On call to O.R. 09/02/21 1009 09/03/21 0559   09/02/21 1000  vancomycin (VANCOREADY) IVPB 750 mg/150 mL  Status:  Discontinued        750 mg 150 mL/hr over 60 Minutes Intravenous Every 48 hours 09/01/21 1252 09/03/21 0933   09/02/21 0600  ceFAZolin (ANCEF) IVPB 2g/100 mL premix  Status:  Discontinued        2 g 200 mL/hr over 30 Minutes Intravenous On call to O.R. 09/02/21 0243 09/02/21 0856   08/31/21 0730  vancomycin (VANCOREADY) IVPB 750 mg/150 mL        750 mg 150 mL/hr over 60 Minutes Intravenous  Once 08/31/21 0611 08/31/21 1133   08/29/21  1600  metroNIDAZOLE (FLAGYL) tablet 500 mg  Status:  Discontinued        500 mg Oral Every 12 hours 08/29/21 1501 09/03/21 0933   08/29/21 1600  ceFEPIme (MAXIPIME) 2 g in sodium chloride 0.9 % 100 mL IVPB  Status:  Discontinued        2 g 200 mL/hr over 30 Minutes Intravenous Every 24 hours 08/29/21 1523 09/03/21 0933   08/29/21 1545  vancomycin (VANCOREADY) IVPB 750 mg/150 mL  Status:  Discontinued        750 mg 150 mL/hr over 60 Minutes Intravenous  Once 08/29/21 1535 08/31/21 0611   08/29/21 1524  vancomycin variable dose per unstable renal  function (pharmacist dosing)  Status:  Discontinued         Does not apply See admin instructions 08/29/21 1525 09/01/21 1252   08/29/21 1215  vancomycin (VANCOCIN) IVPB 1000 mg/200 mL premix        1,000 mg 200 mL/hr over 60 Minutes Intravenous  Once 08/29/21 1214 08/29/21 1332       Objective: Vitals:   09/11/21 0857 09/11/21 1654  BP: 139/80 140/84  Pulse: 72 76  Resp: 16 18  Temp: 97.8 F (36.6 C) 98 F (36.7 C)  SpO2: 99% 99%    Intake/Output Summary (Last 24 hours) at 09/11/2021 1700 Last data filed at 09/11/2021 1655 Gross per 24 hour  Intake 1080 ml  Output 1400 ml  Net -320 ml   Filed Weights   08/29/21 1125 09/02/21 1206 09/04/21 1204  Weight: 87.5 kg 87.5 kg 88.5 kg   Weight change:  Body mass index is 33.47 kg/m.   Physical Exam: General exam: Middle-aged Caucasian male.  Not in physical distress Skin: No rashes, lesions or ulcers. HEENT: Atraumatic, normocephalic, no obvious bleeding Lungs: Clear to auscultation bilaterally CVS: Regular rate and rhythm, no murmur GI/Abd soft, nontender, nondistended, bowel sound present CNS: Alert, awake, oriented x3 Psychiatry: Mood appropriate Extremities: Left AKA stump with wound VAC on  Data Review: I have personally reviewed the laboratory data and studies available.  F/u labs ordered Unresulted Labs (From admission, onward)     Start     Ordered   09/11/21 1640  Sedimentation rate  Once,   R        09/11/21 1640   09/07/21 0815  Occult blood card to lab, stool  Once,   R        09/07/21 0814   09/04/21 0500  Renal function panel  Daily,   R     Question:  Specimen collection method  Answer:  Lab=Lab collect   09/02/21 1917   Unscheduled  CBC with Differential/Platelet  Daily,   R     Question:  Specimen collection method  Answer:  IV Team=IV Team collect   09/11/21 1700            Signed, Terrilee Croak, MD [facility] 09/11/2021

## 2021-09-12 DIAGNOSIS — L97429 Non-pressure chronic ulcer of left heel and midfoot with unspecified severity: Secondary | ICD-10-CM | POA: Diagnosis not present

## 2021-09-12 DIAGNOSIS — E11621 Type 2 diabetes mellitus with foot ulcer: Secondary | ICD-10-CM | POA: Diagnosis not present

## 2021-09-12 LAB — CBC WITH DIFFERENTIAL/PLATELET
Abs Immature Granulocytes: 0.14 10*3/uL — ABNORMAL HIGH (ref 0.00–0.07)
Basophils Absolute: 0 10*3/uL (ref 0.0–0.1)
Basophils Relative: 0 %
Eosinophils Absolute: 0.2 10*3/uL (ref 0.0–0.5)
Eosinophils Relative: 1 %
HCT: 25.9 % — ABNORMAL LOW (ref 39.0–52.0)
Hemoglobin: 8 g/dL — ABNORMAL LOW (ref 13.0–17.0)
Immature Granulocytes: 1 %
Lymphocytes Relative: 11 %
Lymphs Abs: 2 10*3/uL (ref 0.7–4.0)
MCH: 28.4 pg (ref 26.0–34.0)
MCHC: 30.9 g/dL (ref 30.0–36.0)
MCV: 91.8 fL (ref 80.0–100.0)
Monocytes Absolute: 0.8 10*3/uL (ref 0.1–1.0)
Monocytes Relative: 5 %
Neutro Abs: 14.3 10*3/uL — ABNORMAL HIGH (ref 1.7–7.7)
Neutrophils Relative %: 82 %
Platelets: 314 10*3/uL (ref 150–400)
RBC: 2.82 MIL/uL — ABNORMAL LOW (ref 4.22–5.81)
RDW: 16.3 % — ABNORMAL HIGH (ref 11.5–15.5)
WBC: 17.6 10*3/uL — ABNORMAL HIGH (ref 4.0–10.5)
nRBC: 0 % (ref 0.0–0.2)

## 2021-09-12 LAB — RENAL FUNCTION PANEL
Albumin: 2.7 g/dL — ABNORMAL LOW (ref 3.5–5.0)
Anion gap: 9 (ref 5–15)
BUN: 36 mg/dL — ABNORMAL HIGH (ref 6–20)
CO2: 21 mmol/L — ABNORMAL LOW (ref 22–32)
Calcium: 8.5 mg/dL — ABNORMAL LOW (ref 8.9–10.3)
Chloride: 108 mmol/L (ref 98–111)
Creatinine, Ser: 2.85 mg/dL — ABNORMAL HIGH (ref 0.61–1.24)
GFR, Estimated: 26 mL/min — ABNORMAL LOW (ref >60)
Glucose, Bld: 86 mg/dL (ref 70–99)
Phosphorus: 3.7 mg/dL (ref 2.5–4.6)
Potassium: 5.1 mmol/L (ref 3.5–5.1)
Sodium: 138 mmol/L (ref 135–145)

## 2021-09-12 LAB — GLUCOSE, CAPILLARY
Glucose-Capillary: 81 mg/dL (ref 70–99)
Glucose-Capillary: 151 mg/dL — ABNORMAL HIGH (ref 70–99)
Glucose-Capillary: 155 mg/dL — ABNORMAL HIGH (ref 70–99)
Glucose-Capillary: 149 mg/dL — ABNORMAL HIGH (ref 70–99)

## 2021-09-12 NOTE — Plan of Care (Signed)
Vitals stable, no complained of pain. CHG bath done.  Problem: Education: Goal: Knowledge of General Education information will improve Description: Including pain rating scale, medication(s)/side effects and non-pharmacologic comfort measures Outcome: Progressing   Problem: Health Behavior/Discharge Planning: Goal: Ability to manage health-related needs will improve Outcome: Progressing   Problem: Clinical Measurements: Goal: Ability to maintain clinical measurements within normal limits will improve Outcome: Progressing Goal: Will remain free from infection Outcome: Progressing Goal: Diagnostic test results will improve Outcome: Progressing Goal: Respiratory complications will improve Outcome: Progressing Goal: Cardiovascular complication will be avoided Outcome: Progressing   Problem: Activity: Goal: Risk for activity intolerance will decrease Outcome: Progressing   Problem: Nutrition: Goal: Adequate nutrition will be maintained Outcome: Progressing   Problem: Coping: Goal: Level of anxiety will decrease Outcome: Progressing   Problem: Elimination: Goal: Will not experience complications related to bowel motility Outcome: Progressing Goal: Will not experience complications related to urinary retention Outcome: Progressing   Problem: Pain Managment: Goal: General experience of comfort will improve Outcome: Progressing   Problem: Safety: Goal: Ability to remain free from injury will improve Outcome: Progressing   Problem: Skin Integrity: Goal: Risk for impaired skin integrity will decrease Outcome: Progressing   Problem: Education: Goal: Ability to describe self-care measures that may prevent or decrease complications (Diabetes Survival Skills Education) will improve Outcome: Progressing Goal: Individualized Educational Video(s) Outcome: Progressing   Problem: Coping: Goal: Ability to adjust to condition or change in health will improve Outcome:  Progressing   Problem: Fluid Volume: Goal: Ability to maintain a balanced intake and output will improve Outcome: Progressing   Problem: Health Behavior/Discharge Planning: Goal: Ability to identify and utilize available resources and services will improve Outcome: Progressing Goal: Ability to manage health-related needs will improve Outcome: Progressing   Problem: Metabolic: Goal: Ability to maintain appropriate glucose levels will improve Outcome: Progressing   Problem: Nutritional: Goal: Maintenance of adequate nutrition will improve Outcome: Progressing Goal: Progress toward achieving an optimal weight will improve Outcome: Progressing   Problem: Skin Integrity: Goal: Risk for impaired skin integrity will decrease Outcome: Progressing   Problem: Tissue Perfusion: Goal: Adequacy of tissue perfusion will improve Outcome: Progressing   Problem: Education: Goal: Knowledge of the prescribed therapeutic regimen will improve Outcome: Progressing Goal: Ability to verbalize activity precautions or restrictions will improve Outcome: Progressing Goal: Understanding of discharge needs will improve Outcome: Progressing   Problem: Activity: Goal: Ability to perform//tolerate increased activity and mobilize with assistive devices will improve Outcome: Progressing   Problem: Clinical Measurements: Goal: Postoperative complications will be avoided or minimized Outcome: Progressing   Problem: Self-Care: Goal: Ability to meet self-care needs will improve Outcome: Progressing   Problem: Self-Concept: Goal: Ability to maintain and perform role responsibilities to the fullest extent possible will improve Outcome: Progressing   Problem: Pain Management: Goal: Pain level will decrease with appropriate interventions Outcome: Progressing   Problem: Skin Integrity: Goal: Demonstration of wound healing without infection will improve Outcome: Progressing

## 2021-09-12 NOTE — Progress Notes (Signed)
PROGRESS NOTE  [name]  DOB: [birth year]  PCP: Camillia Herter, NP TOI:[id number]  DOA: 08/29/2021  LOS: 67 days  Hospital Day: 15  Brief narrative: [name] is a 51 y.o. 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 Patient presented to the ED on 08/29/2021 with complaint of LEFT foot pain, draining wound.  Initial labs showed creatinine elevated to 5.2 against a baseline of 2.5. MRI left foot showed osteomyelitis of the cuboid and fifth metatarsal.  Podiatry consult obtained.  No salvage operations were just possible. 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 See below for details  Subjective: Patient was seen and examined this morning. Lying on bed.  Not in distress.  No new symptoms.  No fever.  WC count gradually improving.  Creatinine slightly up today.  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 Intra-Op, patient was noted to have purulent abscess that extended up to the popliteal fossa with necrotic muscle along the medial head of the gastrocnemius muscle and medial soleus.   -6/9, patient was taken back to the OR.  He was noted to have purulence draining from below the knee  amputation and hence BKA was converted to AKA.  Margins are noted to be clean. -6/9, underwent tunneled central line placement by IR. -Per ID recommendation, patient was treated with daptomycin and cefazolin for 7 days postsurgery.  Last dose of antibiotics today 6/17.  Persistent leukocytosis -WBC count ran high for several days. -With the concern of possible other infection, on 6/15, patient underwent left thigh/hip CT scan which not show any abscess or signs of septic arthritis.  -Patient completed a course of daptomycin and cefazolin 6/17.  -No fever.  WBC count gradually improving last 3 days.  To finish the course of antibiotics today. 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  TTE recommended today did not show any evidence of vegetation.  Acute kidney injury superimposed on CKD 4 -Creatinine was elevated up to 5.2 on admission, basleine around 2.5-2.8 since Jan. -Nephrology consultation was obtained.  Patient was started on bicarb drip.  Renal function gradually improved, slightly rising over the last 2 days but remains close to baseline.  Continue to monitor.  Encourage oral hydration.  Not on any nephrotoxic medication at this time. -To follow-up with nephrologist Dr. [name] after discharge. Recent Labs    09/03/21 0149 09/04/21 0755 09/05/21 0142 09/06/21 0053 09/07/21 0505 09/08/21 0436 09/09/21 0430 09/10/21 0450 09/11/21 0457 09/12/21 0405  BUN 112* 97* 85* 73* 61* 55* 48* 44* 36* 36*  CREATININE 3.69* 3.36* 3.01* 2.93* 2.77* 2.65* 2.48* 2.42* 2.56* 2.85*   Acute postoperative anemia due to expected blood loss - Postoperatively hemoglobin trended down to the lowest of 6.6 on 6/12.  2 units PRBC transfused.  Noted a drop in hemoglobin from 9-8 today.  No active bleeding.  Continue to monitor.  Recent Labs    10/14/20 1428  10/17/20 1511 01/13/21 0938 01/13/21 0939 09/06/21 0945 09/07/21 0505 09/08/21 0436 09/09/21 0430 09/10/21 0450 09/11/21 0457 09/12/21 0405  HGB  --    < >  --    < >  --    < > 9.3* 9.2* 9.7* 9.0* 8.0*  MCV  --    < >  --    < >  --    < > 86.0 87.5 88.4 88.7 91.8  VITAMINB12  --   --   --   --  685  --   --   --   --   --   --   FOLATE 7.1  --  8.1  --  7.5  --   --   --   --   --   --   FERRITIN  --   --  159  --  525*  --   --   --   --   --   --   TIBC  --   --  269  --  NOT CALCULATED  --   --   --   --   --   --   IRON  --   --  54  --  75  --   --   --   --   --   --    < > = values in this interval not displayed.   Type 2 diabetes mellitus -A1c 7.5 on 6/4 -Currently on Lantus 10 units daily, sliding scale insulin with Accu-Cheks Recent Labs  Lab 09/11/21 0852 09/11/21 1215 09/11/21 1641 09/11/21 2037 09/12/21 0757  GLUCAP 88 94 102* 103* 81   Essential hypertension -Currently blood pressure controlled with amlodipine, Coreg, hydralazine -Clonidine remain on hold.  CAD/HLD -Continue aspirin, beta-blocker statin  Severe protein-calorie malnutrition -Albumin was at  < 1.5. -IV albumin was given.  Dietitian consulted. -continue nutritional supplementation with Ensure.    Goals of care   Code Status: Full Code    Mobility: Encourage ambulation.  SNF recommended PT  Skin assessment:     Nutritional status:  Body mass index is 33.47 kg/m.  Nutrition Problem: Increased nutrient needs Etiology: acute illness, wound healing Signs/Symptoms: estimated needs     Diet:  Diet Order             Diet Carb Modified Fluid consistency: Thin; Room service appropriate? Yes with Assist  Diet effective now                   DVT prophylaxis:  heparin injection 5,000 Units Start: 09/09/21 1400 SCD's Start: 09/04/21 1633 SCD's Start: 09/02/21 1641   Antimicrobials: Completed the course of antibiotics today Fluid: None Consultants: ID,  orthopedics Family Communication: None at bedside  Status is: Inpatient  Continue in-hospital care because: To complete cefazolin daptomycin tomorrow today.  Monitor off antibiotics for next 2 days.  Repeat WBC count tomorrow. Level of care: Med-Surg   Dispo: The patient is from: Home              Anticipated d/c is to: SNF  Patient currently is not medically stable to d/c.   Difficult to place patient No     Infusions:    ceFAZolin (ANCEF) IV 2 g (09/12/21 0832)   magnesium sulfate bolus IVPB      Scheduled Meds:  amLODipine  10 mg Oral Daily   vitamin C  1,000 mg Oral Daily   aspirin EC  81 mg Oral Daily   atorvastatin  20 mg Oral Daily   carvedilol  25 mg Oral BID WC   Chlorhexidine Gluconate Cloth  6 each Topical Daily   docusate sodium  100 mg Oral Daily   feeding supplement  237 mL Oral TID BM   ferrous sulfate  325 mg Oral Q breakfast   gabapentin  300 mg Oral QHS   heparin injection (subcutaneous)  5,000 Units Subcutaneous Q8H   hydrALAZINE  25 mg Oral Q8H   insulin aspart  0-15 Units Subcutaneous TID WC   insulin glargine-yfgn  10 Units Subcutaneous Daily   multivitamin with minerals  1 tablet Oral Daily   nutrition supplement (JUVEN)  1 packet Oral BID BM   pantoprazole  40 mg Oral Daily   zinc sulfate  220 mg Oral Daily    PRN meds: acetaminophen, alum & mag hydroxide-simeth, bisacodyl, guaiFENesin-dextromethorphan, hydrALAZINE, HYDROmorphone (DILAUDID) injection, labetalol, magnesium citrate, magnesium sulfate bolus IVPB, metoprolol tartrate, ondansetron, ondansetron **OR** [DISCONTINUED] ondansetron (ZOFRAN) IV, oxyCODONE, oxyCODONE, phenol, polyethylene glycol, potassium chloride, senna, sodium chloride flush   Antimicrobials: Anti-infectives (From admission, onward)    Start     Dose/Rate Route Frequency Ordered Stop   09/06/21 0745  ceFAZolin (ANCEF) IVPB 2g/100 mL premix        2 g 200 mL/hr over 30 Minutes Intravenous Every 8 hours  09/06/21 0733 09/12/21 2359   09/05/21 0600  ceFAZolin (ANCEF) IVPB 2g/100 mL premix        2 g 200 mL/hr over 30 Minutes Intravenous On call to O.R. 09/04/21 1214 09/04/21 1504   09/04/21 1730  ceFAZolin (ANCEF) IVPB 2g/100 mL premix  Status:  Discontinued        2 g 200 mL/hr over 30 Minutes Intravenous Every 8 hours 09/04/21 1632 09/04/21 1745   09/03/21 2000  DAPTOmycin (CUBICIN) 550 mg in sodium chloride 0.9 % IVPB        8 mg/kg  70.5 kg (Adjusted) 122 mL/hr over 30 Minutes Intravenous Every 48 hours 09/03/21 0945 09/11/21 2212   09/03/21 1045  ceFAZolin (ANCEF) IVPB 2g/100 mL premix  Status:  Discontinued        2 g 200 mL/hr over 30 Minutes Intravenous Every 12 hours 09/03/21 0945 09/06/21 0733   09/02/21 1100  ceFAZolin (ANCEF) IVPB 2g/100 mL premix        2 g 200 mL/hr over 30 Minutes Intravenous On call to O.R. 09/02/21 1009 09/03/21 0559   09/02/21 1000  vancomycin (VANCOREADY) IVPB 750 mg/150 mL  Status:  Discontinued        750 mg 150 mL/hr over 60 Minutes Intravenous Every 48 hours 09/01/21 1252 09/03/21 0933   09/02/21 0600  ceFAZolin (ANCEF) IVPB 2g/100 mL premix  Status:  Discontinued        2 g 200 mL/hr over 30 Minutes Intravenous On call to O.R. 09/02/21 0243 09/02/21 0856   08/31/21 0730  vancomycin (VANCOREADY) IVPB 750 mg/150 mL        750 mg 150 mL/hr over 60 Minutes Intravenous  Once 08/31/21 0611 08/31/21 1133   08/29/21 1600  metroNIDAZOLE (  FLAGYL) tablet 500 mg  Status:  Discontinued        500 mg Oral Every 12 hours 08/29/21 1501 09/03/21 0933   08/29/21 1600  ceFEPIme (MAXIPIME) 2 g in sodium chloride 0.9 % 100 mL IVPB  Status:  Discontinued        2 g 200 mL/hr over 30 Minutes Intravenous Every 24 hours 08/29/21 1523 09/03/21 0933   08/29/21 1545  vancomycin (VANCOREADY) IVPB 750 mg/150 mL  Status:  Discontinued        750 mg 150 mL/hr over 60 Minutes Intravenous  Once 08/29/21 1535 08/31/21 0611   08/29/21 1524  vancomycin variable dose per unstable  renal function (pharmacist dosing)  Status:  Discontinued         Does not apply See admin instructions 08/29/21 1525 09/01/21 1252   08/29/21 1215  vancomycin (VANCOCIN) IVPB 1000 mg/200 mL premix        1,000 mg 200 mL/hr over 60 Minutes Intravenous  Once 08/29/21 1214 08/29/21 1332       Objective: Vitals:   09/12/21 0437 09/12/21 0757  BP: 136/74 132/81  Pulse: 80 83  Resp: 16 18  Temp: 98.1 F (36.7 C) 98 F (36.7 C)  SpO2: 100% 100%    Intake/Output Summary (Last 24 hours) at 09/12/2021 1031 Last data filed at 09/12/2021 0900 Gross per 24 hour  Intake --  Output 1000 ml  Net -1000 ml   Filed Weights   08/29/21 1125 09/02/21 1206 09/04/21 1204  Weight: 87.5 kg 87.5 kg 88.5 kg   Weight change:  Body mass index is 33.47 kg/m.   Physical Exam: General exam: Middle-aged Caucasian male.  Not in physical distress Skin: No rashes, lesions or ulcers. HEENT: Atraumatic, normocephalic, no obvious bleeding Lungs: Clear to auscultation bilaterally CVS: Regular rate and rhythm, no murmur GI/Abd soft, nontender, nondistended, bowel sound present CNS: Alert, awake, oriented x3 Psychiatry: Mood appropriate Extremities: Left AKA stump with wound VAC on  Data Review: I have personally reviewed the laboratory data and studies available.  F/u labs ordered Unresulted Labs (From admission, onward)     Start     Ordered   09/12/21 0500  CBC with Differential/Platelet  Daily,   R     Question:  Specimen collection method  Answer:  IV Team=IV Team collect   09/11/21 1700   09/11/21 1640  Sedimentation rate  Once,   R        09/11/21 1640   09/07/21 0815  Occult blood card to lab, stool  Once,   R        09/07/21 0814   09/04/21 0500  Renal function panel  Daily,   R     Question:  Specimen collection method  Answer:  Lab=Lab collect   09/02/21 1917            Signed, Terrilee Croak, MD [facility] 09/12/2021

## 2021-09-12 NOTE — Progress Notes (Signed)
Mobility Specialist Progress Note:   09/12/21 1040  Mobility  Activity  (HEP handout)  Range of Motion/Exercises Active;Left arm;Right arm  Level of Assistance Independent  Assistive Device None  Activity Response Tolerated well  $Mobility charge 1 Mobility   Pt eager for mobility session. Performed bed level exercises on HEP handout provided by PT. No c/o throughout. Pt c/o bed sores on scrotum and buttocks, educated to turn on side to relieve pressure, pt agreed. Left with all needs met.   [name] Secure Chat or Office Phone: [phone]

## 2021-09-13 DIAGNOSIS — E11621 Type 2 diabetes mellitus with foot ulcer: Secondary | ICD-10-CM | POA: Diagnosis not present

## 2021-09-13 DIAGNOSIS — L97429 Non-pressure chronic ulcer of left heel and midfoot with unspecified severity: Secondary | ICD-10-CM | POA: Diagnosis not present

## 2021-09-13 LAB — RENAL FUNCTION PANEL
Albumin: 2.4 g/dL — ABNORMAL LOW (ref 3.5–5.0)
Anion gap: 7 (ref 5–15)
BUN: 38 mg/dL — ABNORMAL HIGH (ref 6–20)
CO2: 25 mmol/L (ref 22–32)
Calcium: 8.5 mg/dL — ABNORMAL LOW (ref 8.9–10.3)
Chloride: 106 mmol/L (ref 98–111)
Creatinine, Ser: 2.84 mg/dL — ABNORMAL HIGH (ref 0.61–1.24)
GFR, Estimated: 26 mL/min — ABNORMAL LOW (ref >60)
Glucose, Bld: 147 mg/dL — ABNORMAL HIGH (ref 70–99)
Phosphorus: 3 mg/dL (ref 2.5–4.6)
Potassium: 4.8 mmol/L (ref 3.5–5.1)
Sodium: 138 mmol/L (ref 135–145)

## 2021-09-13 LAB — CBC WITH DIFFERENTIAL/PLATELET
Abs Immature Granulocytes: 0.14 10*3/uL — ABNORMAL HIGH (ref 0.00–0.07)
Basophils Absolute: 0 10*3/uL (ref 0.0–0.1)
Basophils Relative: 0 %
Eosinophils Absolute: 0.2 10*3/uL (ref 0.0–0.5)
Eosinophils Relative: 1 %
HCT: 24 % — ABNORMAL LOW (ref 39.0–52.0)
Hemoglobin: 7.7 g/dL — ABNORMAL LOW (ref 13.0–17.0)
Immature Granulocytes: 1 %
Lymphocytes Relative: 15 %
Lymphs Abs: 2.5 10*3/uL (ref 0.7–4.0)
MCH: 28.6 pg (ref 26.0–34.0)
MCHC: 32.1 g/dL (ref 30.0–36.0)
MCV: 89.2 fL (ref 80.0–100.0)
Monocytes Absolute: 1 10*3/uL (ref 0.1–1.0)
Monocytes Relative: 6 %
Neutro Abs: 12.7 10*3/uL — ABNORMAL HIGH (ref 1.7–7.7)
Neutrophils Relative %: 77 %
Platelets: 303 10*3/uL (ref 150–400)
RBC: 2.69 MIL/uL — ABNORMAL LOW (ref 4.22–5.81)
RDW: 16.4 % — ABNORMAL HIGH (ref 11.5–15.5)
WBC: 16.6 10*3/uL — ABNORMAL HIGH (ref 4.0–10.5)
nRBC: 0 % (ref 0.0–0.2)

## 2021-09-13 LAB — GLUCOSE, CAPILLARY
Glucose-Capillary: 131 mg/dL — ABNORMAL HIGH (ref 70–99)
Glucose-Capillary: 89 mg/dL (ref 70–99)
Glucose-Capillary: 71 mg/dL (ref 70–99)
Glucose-Capillary: 114 mg/dL — ABNORMAL HIGH (ref 70–99)

## 2021-09-13 LAB — SEDIMENTATION RATE: Sed Rate: 85 mm/hr — ABNORMAL HIGH (ref 0–16)

## 2021-09-13 NOTE — Progress Notes (Signed)
Mobility Specialist Progress Note:   09/13/21 1005  Mobility  Activity  (HEP handout)  Range of Motion/Exercises Active;Left arm;Right arm  Level of Assistance Independent  Assistive Device None  Activity Response Tolerated well  $Mobility charge 1 Mobility   Pt agreeable to perform the exercises on the HEP handout provided by PT. Pt tolerated well. Left with all needs met.   [name] Secure Chat or Office Phone: [phone]

## 2021-09-13 NOTE — Progress Notes (Signed)
PROGRESS NOTE  [name]  DOB: 1970/06/18  PCP: Camillia Herter, NP YOV:[id number]  DOA: 08/29/2021  LOS: 4 days  Hospital Day: 16  Brief narrative: [name] is a 51 y.o. 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 Patient presented to the ED on 08/29/2021 with complaint of LEFT foot pain, draining wound.  Initial labs showed creatinine elevated to 5.2 against a baseline of 2.5. MRI left foot showed osteomyelitis of the cuboid and fifth metatarsal.  Podiatry consult obtained.  No salvage operations were just possible. 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 Intra-Op, patient was noted to have purulent abscess that extended up to the popliteal fossa with necrotic muscle along the medial head of the gastrocnemius muscle and medial soleus.   -6/9, patient was taken back to the OR.  He was noted to have purulence draining from below the knee amputation and hence BKA was converted to AKA.  Margins are noted to be clean. -6/9, underwent tunneled central line placement by IR. See below for details  Subjective: Patient was seen and examined this morning. Lying on bed.  Not in distress.  No new symptoms.  No fever.  WBC continues to improve.  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  Last dose of antibiotics today 6/17.  Currently off antibiotics.  WBC count improving.  Persistent leukocytosis -WBC count was running high for several days. -With the concern of left lower extremity infection extending upwards, on 6/15, patient underwent left thigh/hip CT scan which not show any abscess or signs of septic arthritis.  -Patient completed a course of daptomycin and cefazolin 6/17.  -No fever.  WBC count gradually improving last 4 days days.  Completed a course of antibiotics on 6/17. Recent Labs  Lab 09/09/21 0430 09/10/21 0450 09/11/21 0457 09/12/21 0405 09/13/21 0325  WBC 24.4* 25.1* 20.3* 17.6* 16.6*    History of MSSA MV endocarditis -In January 2023.  TTE recommended today did not show any evidence of vegetation.  Acute kidney injury superimposed on CKD 4 -Creatinine was elevated up to 5.2 on admission, basleine around 2.5-2.8 since Jan. -Nephrology consultation was obtained.  Patient was started on bicarb drip.  Renal function gradually improved, slightly rising over the last 2 days but remains close to baseline.  Continue to monitor.  Encourage oral hydration.  Not on any nephrotoxic medication at this time. -To follow-up with nephrologist Dr. [name] after discharge. Recent Labs    09/04/21 0755 09/05/21 0142 09/06/21 0053 09/07/21 0505 09/08/21 0436 09/09/21 0430 09/10/21 0450 09/11/21 0457 09/12/21 0405 09/13/21 0325  BUN 97* 85* 73* 61* 55* 48* 44* 36* 36* 38*  CREATININE  3.36* 3.01* 2.93* 2.77* 2.65* 2.48* 2.42* 2.56* 2.85* 2.84*    Acute postoperative anemia due to expected blood loss - Postoperatively hemoglobin trended down to the lowest of 6.6 on 6/12.  2 units PRBC transfused.  Noted a drop in hemoglobin in last 48 hours.  No active bleeding.  Continue to monitor.  Not on IV fluid Recent  Labs    10/14/20 1428 10/17/20 1511 01/13/21 0938 01/13/21 0939 09/06/21 0945 09/07/21 0505 09/09/21 0430 09/10/21 0450 09/11/21 0457 09/12/21 0405 09/13/21 0325  HGB  --    < >  --    < >  --    < > 9.2* 9.7* 9.0* 8.0* 7.7*  MCV  --    < >  --    < >  --    < > 87.5 88.4 88.7 91.8 89.2  VITAMINB12  --   --   --   --  685  --   --   --   --   --   --   FOLATE 7.1  --  8.1  --  7.5  --   --   --   --   --   --   FERRITIN  --   --  159  --  525*  --   --   --   --   --   --   TIBC  --   --  269  --  NOT CALCULATED  --   --   --   --   --   --   IRON  --   --  54  --  75  --   --   --   --   --   --    < > = values in this interval not displayed.    Type 2 diabetes mellitus -A1c 7.5 on 6/4 -Currently on Lantus 10 units daily, sliding scale insulin with Accu-Cheks Recent Labs  Lab 09/12/21 1149 09/12/21 1558 09/12/21 2053 09/13/21 0811 09/13/21 1153  GLUCAP 151* 155* 149* 131* 89    Essential hypertension -Currently blood pressure controlled with amlodipine, Coreg, hydralazine -Clonidine remain on hold.  CAD/HLD -Continue aspirin, beta-blocker statin  Severe protein-calorie malnutrition -Albumin was at  < 1.5. -IV albumin was given.  Dietitian consulted. -continue nutritional supplementation with Ensure.    Goals of care   Code Status: Full Code    Mobility: Encourage ambulation.  SNF recommended PT  Skin assessment:     Nutritional status:  Body mass index is 33.47 kg/m.  Nutrition Problem: Increased nutrient needs Etiology: acute illness, wound healing Signs/Symptoms: estimated needs     Diet:  Diet Order             Diet Carb Modified Fluid consistency: Thin; Room service appropriate? Yes with Assist  Diet effective now                   DVT prophylaxis:  heparin injection 5,000 Units Start: 09/09/21 1400 SCD's Start: 09/04/21 1633 SCD's Start: 09/02/21 1641   Antimicrobials: Completed the course of antibiotics today Fluid:  None Consultants: ID, orthopedics Family Communication: None at bedside  Status is: Inpatient  Continue in-hospital care because: To complete cefazolin daptomycin tomorrow today.  Monitor off antibiotics for next 2 days.  Repeat WBC count tomorrow. Level of care: Med-Surg   Dispo: The patient is from: Home              Anticipated d/c is  to: SNF              Patient currently is not medically stable to d/c.   Difficult to place patient No     Infusions:   magnesium sulfate bolus IVPB      Scheduled Meds:  amLODipine  10 mg Oral Daily   vitamin C  1,000 mg Oral Daily   aspirin EC  81 mg Oral Daily   atorvastatin  20 mg Oral Daily   carvedilol  25 mg Oral BID WC   Chlorhexidine Gluconate Cloth  6 each Topical Daily   docusate sodium  100 mg Oral Daily   feeding supplement  237 mL Oral TID BM   ferrous sulfate  325 mg Oral Q breakfast   gabapentin  300 mg Oral QHS   heparin injection (subcutaneous)  5,000 Units Subcutaneous Q8H   hydrALAZINE  25 mg Oral Q8H   insulin aspart  0-15 Units Subcutaneous TID WC   insulin glargine-yfgn  10 Units Subcutaneous Daily   multivitamin with minerals  1 tablet Oral Daily   nutrition supplement (JUVEN)  1 packet Oral BID BM   pantoprazole  40 mg Oral Daily   zinc sulfate  220 mg Oral Daily    PRN meds: acetaminophen, alum & mag hydroxide-simeth, bisacodyl, guaiFENesin-dextromethorphan, hydrALAZINE, HYDROmorphone (DILAUDID) injection, labetalol, magnesium citrate, magnesium sulfate bolus IVPB, metoprolol tartrate, ondansetron, ondansetron **OR** [DISCONTINUED] ondansetron (ZOFRAN) IV, oxyCODONE, oxyCODONE, phenol, polyethylene glycol, potassium chloride, senna, sodium chloride flush   Antimicrobials: Anti-infectives (From admission, onward)    Start     Dose/Rate Route Frequency Ordered Stop   09/06/21 0745  ceFAZolin (ANCEF) IVPB 2g/100 mL premix        2 g 200 mL/hr over 30 Minutes Intravenous Every 8 hours 09/06/21 0733 09/12/21  1617   09/05/21 0600  ceFAZolin (ANCEF) IVPB 2g/100 mL premix        2 g 200 mL/hr over 30 Minutes Intravenous On call to O.R. 09/04/21 1214 09/04/21 1504   09/04/21 1730  ceFAZolin (ANCEF) IVPB 2g/100 mL premix  Status:  Discontinued        2 g 200 mL/hr over 30 Minutes Intravenous Every 8 hours 09/04/21 1632 09/04/21 1745   09/03/21 2000  DAPTOmycin (CUBICIN) 550 mg in sodium chloride 0.9 % IVPB        8 mg/kg  70.5 kg (Adjusted) 122 mL/hr over 30 Minutes Intravenous Every 48 hours 09/03/21 0945 09/11/21 2212   09/03/21 1045  ceFAZolin (ANCEF) IVPB 2g/100 mL premix  Status:  Discontinued        2 g 200 mL/hr over 30 Minutes Intravenous Every 12 hours 09/03/21 0945 09/06/21 0733   09/02/21 1100  ceFAZolin (ANCEF) IVPB 2g/100 mL premix        2 g 200 mL/hr over 30 Minutes Intravenous On call to O.R. 09/02/21 1009 09/03/21 0559   09/02/21 1000  vancomycin (VANCOREADY) IVPB 750 mg/150 mL  Status:  Discontinued        750 mg 150 mL/hr over 60 Minutes Intravenous Every 48 hours 09/01/21 1252 09/03/21 0933   09/02/21 0600  ceFAZolin (ANCEF) IVPB 2g/100 mL premix  Status:  Discontinued        2 g 200 mL/hr over 30 Minutes Intravenous On call to O.R. 09/02/21 0243 09/02/21 0856   08/31/21 0730  vancomycin (VANCOREADY) IVPB 750 mg/150 mL        750 mg 150 mL/hr over 60 Minutes Intravenous  Once 08/31/21 0611 08/31/21 1133  08/29/21 1600  metroNIDAZOLE (FLAGYL) tablet 500 mg  Status:  Discontinued        500 mg Oral Every 12 hours 08/29/21 1501 09/03/21 0933   08/29/21 1600  ceFEPIme (MAXIPIME) 2 g in sodium chloride 0.9 % 100 mL IVPB  Status:  Discontinued        2 g 200 mL/hr over 30 Minutes Intravenous Every 24 hours 08/29/21 1523 09/03/21 0933   08/29/21 1545  vancomycin (VANCOREADY) IVPB 750 mg/150 mL  Status:  Discontinued        750 mg 150 mL/hr over 60 Minutes Intravenous  Once 08/29/21 1535 08/31/21 0611   08/29/21 1524  vancomycin variable dose per unstable renal function  (pharmacist dosing)  Status:  Discontinued         Does not apply See admin instructions 08/29/21 1525 09/01/21 1252   08/29/21 1215  vancomycin (VANCOCIN) IVPB 1000 mg/200 mL premix        1,000 mg 200 mL/hr over 60 Minutes Intravenous  Once 08/29/21 1214 08/29/21 1332       Objective: Vitals:   09/13/21 0533 09/13/21 0912  BP: (!) 117/53 135/77  Pulse: 84 84  Resp: 18 17  Temp: 98.2 F (36.8 C) 97.9 F (36.6 C)  SpO2: 99% 100%    Intake/Output Summary (Last 24 hours) at 09/13/2021 1451 Last data filed at 09/13/2021 0540 Gross per 24 hour  Intake 350 ml  Output 1000 ml  Net -650 ml    Filed Weights   08/29/21 1125 09/02/21 1206 09/04/21 1204  Weight: 87.5 kg 87.5 kg 88.5 kg   Weight change:  Body mass index is 33.47 kg/m.   Physical Exam: General exam: Middle-aged Caucasian male.  Not in physical distress Skin: No rashes, lesions or ulcers. HEENT: Atraumatic, normocephalic, no obvious bleeding Lungs: Clear to auscultation bilaterally CVS: Regular rate and rhythm, no murmur GI/Abd soft, nontender, nondistended, bowel sound present CNS: Alert, awake, oriented x3 Psychiatry: Mood appropriate Extremities: Left AKA stump with wound VAC on  Data Review: I have personally reviewed the laboratory data and studies available.  F/u labs ordered Unresulted Labs (From admission, onward)     Start     Ordered   09/12/21 0500  CBC with Differential/Platelet  Daily,   R     Question:  Specimen collection method  Answer:  IV Team=IV Team collect   09/11/21 1700   09/07/21 0815  Occult blood card to lab, stool  Once,   R        09/07/21 0814   09/04/21 0500  Renal function panel  Daily,   R     Question:  Specimen collection method  Answer:  Lab=Lab collect   09/02/21 1917            Signed, Terrilee Croak, MD [facility] 09/13/2021

## 2021-09-14 DIAGNOSIS — E11621 Type 2 diabetes mellitus with foot ulcer: Secondary | ICD-10-CM | POA: Diagnosis not present

## 2021-09-14 DIAGNOSIS — L97429 Non-pressure chronic ulcer of left heel and midfoot with unspecified severity: Secondary | ICD-10-CM | POA: Diagnosis not present

## 2021-09-14 LAB — CBC WITH DIFFERENTIAL/PLATELET
Abs Immature Granulocytes: 0.07 10*3/uL (ref 0.00–0.07)
Basophils Absolute: 0 10*3/uL (ref 0.0–0.1)
Basophils Relative: 0 %
Eosinophils Absolute: 0.3 10*3/uL (ref 0.0–0.5)
Eosinophils Relative: 2 %
HCT: 22.5 % — ABNORMAL LOW (ref 39.0–52.0)
Hemoglobin: 7.2 g/dL — ABNORMAL LOW (ref 13.0–17.0)
Immature Granulocytes: 1 %
Lymphocytes Relative: 21 %
Lymphs Abs: 2.7 10*3/uL (ref 0.7–4.0)
MCH: 28.9 pg (ref 26.0–34.0)
MCHC: 32 g/dL (ref 30.0–36.0)
MCV: 90.4 fL (ref 80.0–100.0)
Monocytes Absolute: 0.8 10*3/uL (ref 0.1–1.0)
Monocytes Relative: 7 %
Neutro Abs: 9.1 10*3/uL — ABNORMAL HIGH (ref 1.7–7.7)
Neutrophils Relative %: 69 %
Platelets: 279 10*3/uL (ref 150–400)
RBC: 2.49 MIL/uL — ABNORMAL LOW (ref 4.22–5.81)
RDW: 16.9 % — ABNORMAL HIGH (ref 11.5–15.5)
WBC: 12.9 10*3/uL — ABNORMAL HIGH (ref 4.0–10.5)
nRBC: 0 % (ref 0.0–0.2)

## 2021-09-14 LAB — GLUCOSE, CAPILLARY
Glucose-Capillary: 75 mg/dL (ref 70–99)
Glucose-Capillary: 88 mg/dL (ref 70–99)
Glucose-Capillary: 129 mg/dL — ABNORMAL HIGH (ref 70–99)
Glucose-Capillary: 153 mg/dL — ABNORMAL HIGH (ref 70–99)
Glucose-Capillary: 117 mg/dL — ABNORMAL HIGH (ref 70–99)
Glucose-Capillary: 94 mg/dL (ref 70–99)

## 2021-09-14 LAB — RENAL FUNCTION PANEL
Albumin: 2.3 g/dL — ABNORMAL LOW (ref 3.5–5.0)
Anion gap: 5 (ref 5–15)
BUN: 34 mg/dL — ABNORMAL HIGH (ref 6–20)
CO2: 23 mmol/L (ref 22–32)
Calcium: 8.4 mg/dL — ABNORMAL LOW (ref 8.9–10.3)
Chloride: 110 mmol/L (ref 98–111)
Creatinine, Ser: 2.88 mg/dL — ABNORMAL HIGH (ref 0.61–1.24)
GFR, Estimated: 26 mL/min — ABNORMAL LOW (ref >60)
Glucose, Bld: 79 mg/dL (ref 70–99)
Phosphorus: 3.6 mg/dL (ref 2.5–4.6)
Potassium: 4.9 mmol/L (ref 3.5–5.1)
Sodium: 138 mmol/L (ref 135–145)

## 2021-09-14 LAB — PREPARE RBC (CROSSMATCH)

## 2021-09-14 MED ORDER — JUVEN PO PACK: 1.0000 | PACK | Freq: Two times a day (BID) | ORAL | 0 refills | Status: DC

## 2021-09-14 MED ORDER — INSULIN GLARGINE-YFGN 100 UNIT/ML ~~LOC~~ SOLN: 10.0000 [IU] | Freq: Every day | SUBCUTANEOUS | 11 refills | Status: DC

## 2021-09-14 MED ORDER — OXYCODONE HCL 5 MG PO TABS: 5.0000 mg | ORAL_TABLET | Freq: Four times a day (QID) | ORAL | 0 refills | Status: AC | PRN

## 2021-09-14 MED ORDER — PANTOPRAZOLE SODIUM 40 MG PO TBEC: 40.0000 mg | DELAYED_RELEASE_TABLET | Freq: Every day | ORAL | Status: AC

## 2021-09-14 MED ORDER — DOCUSATE SODIUM 100 MG PO CAPS: 100.0000 mg | ORAL_CAPSULE | Freq: Every day | ORAL | 0 refills | Status: AC

## 2021-09-14 MED ORDER — SODIUM CHLORIDE 0.9% IV SOLUTION
Freq: Once | INTRAVENOUS | Status: AC
Start: 2021-09-14 — End: 2021-09-14

## 2021-09-14 MED ORDER — ENSURE ENLIVE PO LIQD: 237.0000 mL | Freq: Three times a day (TID) | ORAL | 12 refills | Status: AC

## 2021-09-14 MED ORDER — ADULT MULTIVITAMIN W/MINERALS CH: 1.0000 | ORAL_TABLET | Freq: Every day | ORAL | Status: AC

## 2021-09-14 MED ORDER — INSULIN ASPART 100 UNIT/ML IJ SOLN: 0.0000 [IU] | Freq: Three times a day (TID) | INTRAMUSCULAR | 11 refills | Status: DC

## 2021-09-14 NOTE — Progress Notes (Signed)
Mobility Specialist Progress Note:   09/14/21 1000  Mobility  Activity  (HEP handout)  Range of Motion/Exercises Active;Right arm;Left arm  Level of Assistance Independent  Assistive Device None  Activity Response Tolerated well  $Mobility charge 1 Mobility   Pt eager for mobility session. Tolerated exercises well. Left with all needs met.  [name] Acute Rehab Secure Chat or Office Phone: [phone]

## 2021-09-14 NOTE — Progress Notes (Signed)
Physical Therapy Treatment Patient Details Name: CATALDO COSGRIFF MRN: [id number] DOB: 11-Oct-1970 Today's Date: 09/14/2021   History of Present Illness Pt is a 51 yr old male who who presented 08/29/21 due to L foot infection, found to have osteo of the cuboid and fifth metatarsal. 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 PMH: allergies, iron deficiency anemia, 3B stage CKD, CAD, constipation, type II DM, glaucoma, hyperlipidemia, hypertension, diabetic peripheral neuropathy, diabetic retinopathy, tobacco use, multiple lower extremities I&D, rt foot chopart amputation    PT Comments    Patient received in bed, he is somewhat reluctant to get oob, required encouragement. He demonstrates supine to sit with supervision, heavy use of bed rail and increased effort. His tendency is to reach for assistance. Patient was able to scoot to drop arm recliner with min A for safety. He will continue to benefit from skilled PT while here to improve functional independence and safety.       Recommendations for follow up therapy are one component of a multi-disciplinary discharge planning process, led by the attending physician.  Recommendations may be updated based on patient status, additional functional criteria and insurance authorization.  Follow Up Recommendations  Skilled nursing-short term rehab (<3 hours/day)     Assistance Recommended at Discharge Frequent or constant Supervision/Assistance  Patient can return home with the following A little help with bathing/dressing/bathroom;Assistance with cooking/housework;Assist for transportation;Help with stairs or ramp for entrance;Two people to help with walking and/or transfers   Equipment Recommendations  None recommended by PT;Other (comment) (TBD)    Recommendations for Other Services       Precautions / Restrictions Precautions Precautions: Fall Precaution Comments: wound vac L LE, previous R foot amputation Restrictions Weight  Bearing Restrictions: Yes LLE Weight Bearing: Non weight bearing     Mobility  Bed Mobility Overal bed mobility: Modified Independent Bed Mobility: Supine to Sit     Supine to sit: Supervision     General bed mobility comments: Required heavy use of rails and increased effort. Supervision for safety    Transfers Overall transfer level: Needs assistance Equipment used: None Transfers: Bed to chair/wheelchair/BSC            Lateral/Scoot Transfers: Min assist General transfer comment: min assist for safety and to prevent scooting forward too far.    Ambulation/Gait               General Gait Details: deferred   Stairs             Wheelchair Mobility    Modified Rankin (Stroke Patients Only)       Balance Overall balance assessment: Needs assistance Sitting-balance support: Feet supported Sitting balance-Leahy Scale: Fair Sitting balance - Comments: tends to scoot too far forward on seated surface.                                    Cognition Arousal/Alertness: Awake/alert Behavior During Therapy: WFL for tasks assessed/performed Overall Cognitive Status: Within Functional Limits for tasks assessed                                 General Comments: Awake, joking throughout session        Exercises      General Comments        Pertinent Vitals/Pain Pain Assessment Pain  Assessment: Faces Faces Pain Scale: Hurts a little bit Pain Location: buttocks Pain Descriptors / Indicators: Discomfort Pain Intervention(s): Monitored during session, Repositioned    Home Living                          Prior Function            PT Goals (current goals can now be found in the care plan section) Acute Rehab PT Goals Patient Stated Goal: to go home PT Goal Formulation: With patient Time For Goal Achievement: 09/17/21 Potential to Achieve Goals: Fair Progress towards PT goals: Progressing toward  goals    Frequency    Min 3X/week      PT Plan Current plan remains appropriate    Co-evaluation              AM-PAC PT "6 Clicks" Mobility   Outcome Measure  Help needed turning from your back to your side while in a flat bed without using bedrails?: None Help needed moving from lying on your back to sitting on the side of a flat bed without using bedrails?: A Lot Help needed moving to and from a bed to a chair (including a wheelchair)?: A Lot Help needed standing up from a chair using your arms (e.g., wheelchair or bedside chair)?: Total Help needed to walk in hospital room?: Total Help needed climbing 3-5 steps with a railing? : Total 6 Click Score: 11    End of Session Equipment Utilized During Treatment: Gait belt Activity Tolerance: Patient tolerated treatment well;Patient limited by fatigue Patient left: in chair;with call bell/phone within reach Nurse Communication: Mobility status PT Visit Diagnosis: Unsteadiness on feet (R26.81);Muscle weakness (generalized) (M62.81);Difficulty in walking, not elsewhere classified (R26.2);Pain Pain - part of body:  (buttocks)     Time: 2482-5003 PT Time Calculation (min) (ACUTE ONLY): 16 min  Charges:  $Therapeutic Activity: 8-22 mins                     Reda Citron, PT, GCS 09/14/21,11:55 AM

## 2021-09-14 NOTE — Progress Notes (Addendum)
PROGRESS NOTE  [name]  DOB: 12-Jun-1970  PCP: Camillia Herter, NP [id number]  DOA: 08/29/2021  LOS: 80 days  Hospital Day: 17  Brief narrative: [name] is a 51 y.o. 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 Patient presented to the ED on 08/29/2021 with complaint of LEFT foot pain, draining wound.  Initial labs showed creatinine elevated to 5.2 against a baseline of 2.5. MRI left foot showed osteomyelitis of the cuboid and fifth metatarsal.  Podiatry consult obtained.  No salvage operations were just possible. 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 Intra-Op, patient was noted to have purulent abscess that extended up to the popliteal fossa with necrotic muscle along the medial head of the gastrocnemius muscle and medial soleus.   -6/9, patient was taken back to the OR.  He was noted to have purulence draining from below the knee amputation and hence BKA was converted to AKA.  Margins are noted to be clean. -6/9, underwent tunneled central line placement by IR. See below for details  Subjective: Patient was seen and examined this morning. Lying on bed.  Not in distress.  No new symptoms.  No fever.  WBC continues to improve.  Hemoglobin gradually dropping.  No active bleeding.  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  Last dose of antibiotics today 6/17.  Currently off antibiotics.  WBC count improving.  Persistent leukocytosis -WBC count was running high for several days. -With the concern of left lower extremity infection extending upwards, on 6/15, patient underwent left thigh/hip CT scan which not show any abscess or signs of septic arthritis.  -Patient completed a course of daptomycin and cefazolin 6/17.  -No fever.  WBC count gradually improving for last 5 days. 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  TTE recommended today did not show any evidence of vegetation.  Acute kidney injury superimposed on CKD 4 -Creatinine was elevated up to 5.2 on admission, basleine around 2.5-2.8 since Jan. -Nephrology consultation was obtained.  Patient was started on bicarb drip.  Renal function gradually improved, and is currently fluctuating a little bit at baseline range.  Continue to monitor.  Encourage oral hydration.  Not on any nephrotoxic medication at this time. -To follow-up with nephrologist Dr. [name] after discharge. Recent Labs    09/05/21 0142 09/06/21 0053 09/07/21 0505 09/08/21 0436 09/09/21 0430 09/10/21 0450 09/11/21 0457 09/12/21 0405 09/13/21 0325 09/14/21 0401  BUN 85* 73* 61* 55* 48* 44* 36* 36* 38* 34*  CREATININE 3.01* 2.93* 2.77*  2.65* 2.48* 2.42* 2.56* 2.85* 2.84* 2.88*   Acute postoperative anemia due to expected blood loss - Postoperatively hemoglobin trended down to the lowest of 6.6 on 6/12.  2 units of PRBC were transfused.  Noted a drop in hemoglobin in last 3 days.  No active bleeding.  Hemoglobin at 7.2 today.  Discussed with patient  and decided to give 1 more unit of PRBC to improve his rehab potential. -Continue Protonix daily. Recent Labs    10/14/20 1428 10/17/20 1511 01/13/21 0938 01/13/21 0939 09/06/21 0945 09/07/21 0505 09/10/21 0450 09/11/21 0457 09/12/21 0405 09/13/21 0325 09/14/21 0401  HGB  --    < >  --    < >  --    < > 9.7* 9.0* 8.0* 7.7* 7.2*  MCV  --    < >  --    < >  --    < > 88.4 88.7 91.8 89.2 90.4  VITAMINB12  --   --   --   --  685  --   --   --   --   --   --   FOLATE 7.1  --  8.1  --  7.5  --   --   --   --   --   --   FERRITIN  --   --  159  --  525*  --   --   --   --   --   --   TIBC  --   --  269  --  NOT CALCULATED  --   --   --   --   --   --   IRON  --   --  54  --  75  --   --   --   --   --   --    < > = values in this interval not displayed.   Tunneled central line in place -6/9, underwent tunneled central line placement by IR.  I believe it was with an anticipation that patient would require long-term antibiotics.  Patient is currently off antibiotics.  The line is being used only for access and he does not have any other vascular access. -6/19, I discussed with IR PA.  We will leave the line in place for now.  On the day of discharge, IR can remove it at bedside.  Type 2 diabetes mellitus -A1c 7.5 on 6/4 -Currently on Lantus 10 units daily, sliding scale insulin with Accu-Cheks Recent Labs  Lab 09/13/21 1716 09/13/21 2043 09/14/21 0750 09/14/21 0754 09/14/21 1030  GLUCAP 71 114* 75 88 129*   Essential hypertension -Currently blood pressure controlled with amlodipine, Coreg, hydralazine -Clonidine remain on hold.  CAD/HLD -Continue aspirin, beta-blocker and statin  Severe protein-calorie malnutrition -Albumin was at  < 1.5. -IV albumin was given.  Dietitian consulted. -continue nutritional supplementation with Ensure.    Goals of care   Code Status: Full Code    Mobility: Encourage ambulation.  SNF recommended PT  Skin assessment:     Nutritional  status:  Body mass index is 33.47 kg/m.  Nutrition Problem: Increased nutrient needs Etiology: acute illness, wound healing Signs/Symptoms: estimated needs     Diet:  Diet Order             Diet Carb Modified Fluid consistency: Thin; Room service appropriate? Yes with Assist  Diet effective now  DVT prophylaxis:  heparin injection 5,000 Units Start: 09/09/21 1400 SCD's Start: 09/04/21 1633 SCD's Start: 09/02/21 1641   Antimicrobials: Completed the course of antibiotics today Fluid: None Consultants: ID, orthopedics Family Communication: None at bedside  Status is: Inpatient  Continue in-hospital care because: To complete cefazolin daptomycin tomorrow today. Monitor off antibiotics for next 2 days.  Repeat WBC count tomorrow. Level of care: Med-Surg   Dispo: The patient is from: Home              Anticipated d/c is to: SNF              Patient currently is not medically stable to d/c.   Difficult to place patient No     Infusions:   magnesium sulfate bolus IVPB      Scheduled Meds:  amLODipine  10 mg Oral Daily   vitamin C  1,000 mg Oral Daily   aspirin EC  81 mg Oral Daily   atorvastatin  20 mg Oral Daily   carvedilol  25 mg Oral BID WC   Chlorhexidine Gluconate Cloth  6 each Topical Daily   docusate sodium  100 mg Oral Daily   feeding supplement  237 mL Oral TID BM   ferrous sulfate  325 mg Oral Q breakfast   gabapentin  300 mg Oral QHS   heparin injection (subcutaneous)  5,000 Units Subcutaneous Q8H   hydrALAZINE  25 mg Oral Q8H   insulin aspart  0-15 Units Subcutaneous TID WC   insulin glargine-yfgn  10 Units Subcutaneous Daily   multivitamin with minerals  1 tablet Oral Daily   nutrition supplement (JUVEN)  1 packet Oral BID BM   pantoprazole  40 mg Oral Daily   zinc sulfate  220 mg Oral Daily    PRN meds: acetaminophen, alum & mag hydroxide-simeth, bisacodyl, guaiFENesin-dextromethorphan, hydrALAZINE, HYDROmorphone  (DILAUDID) injection, labetalol, magnesium citrate, magnesium sulfate bolus IVPB, metoprolol tartrate, ondansetron, ondansetron **OR** [DISCONTINUED] ondansetron (ZOFRAN) IV, oxyCODONE, oxyCODONE, phenol, polyethylene glycol, potassium chloride, senna, sodium chloride flush   Antimicrobials: Anti-infectives (From admission, onward)    Start     Dose/Rate Route Frequency Ordered Stop   09/06/21 0745  ceFAZolin (ANCEF) IVPB 2g/100 mL premix        2 g 200 mL/hr over 30 Minutes Intravenous Every 8 hours 09/06/21 0733 09/12/21 1617   09/05/21 0600  ceFAZolin (ANCEF) IVPB 2g/100 mL premix        2 g 200 mL/hr over 30 Minutes Intravenous On call to O.R. 09/04/21 1214 09/04/21 1504   09/04/21 1730  ceFAZolin (ANCEF) IVPB 2g/100 mL premix  Status:  Discontinued        2 g 200 mL/hr over 30 Minutes Intravenous Every 8 hours 09/04/21 1632 09/04/21 1745   09/03/21 2000  DAPTOmycin (CUBICIN) 550 mg in sodium chloride 0.9 % IVPB        8 mg/kg  70.5 kg (Adjusted) 122 mL/hr over 30 Minutes Intravenous Every 48 hours 09/03/21 0945 09/11/21 2212   09/03/21 1045  ceFAZolin (ANCEF) IVPB 2g/100 mL premix  Status:  Discontinued        2 g 200 mL/hr over 30 Minutes Intravenous Every 12 hours 09/03/21 0945 09/06/21 0733   09/02/21 1100  ceFAZolin (ANCEF) IVPB 2g/100 mL premix        2 g 200 mL/hr over 30 Minutes Intravenous On call to O.R. 09/02/21 1009 09/03/21 0559   09/02/21 1000  vancomycin (VANCOREADY) IVPB 750 mg/150 mL  Status:  Discontinued  750 mg 150 mL/hr over 60 Minutes Intravenous Every 48 hours 09/01/21 1252 09/03/21 0933   09/02/21 0600  ceFAZolin (ANCEF) IVPB 2g/100 mL premix  Status:  Discontinued        2 g 200 mL/hr over 30 Minutes Intravenous On call to O.R. 09/02/21 0243 09/02/21 0856   08/31/21 0730  vancomycin (VANCOREADY) IVPB 750 mg/150 mL        750 mg 150 mL/hr over 60 Minutes Intravenous  Once 08/31/21 0611 08/31/21 1133   08/29/21 1600  metroNIDAZOLE (FLAGYL) tablet 500  mg  Status:  Discontinued        500 mg Oral Every 12 hours 08/29/21 1501 09/03/21 0933   08/29/21 1600  ceFEPIme (MAXIPIME) 2 g in sodium chloride 0.9 % 100 mL IVPB  Status:  Discontinued        2 g 200 mL/hr over 30 Minutes Intravenous Every 24 hours 08/29/21 1523 09/03/21 0933   08/29/21 1545  vancomycin (VANCOREADY) IVPB 750 mg/150 mL  Status:  Discontinued        750 mg 150 mL/hr over 60 Minutes Intravenous  Once 08/29/21 1535 08/31/21 0611   08/29/21 1524  vancomycin variable dose per unstable renal function (pharmacist dosing)  Status:  Discontinued         Does not apply See admin instructions 08/29/21 1525 09/01/21 1252   08/29/21 1215  vancomycin (VANCOCIN) IVPB 1000 mg/200 mL premix        1,000 mg 200 mL/hr over 60 Minutes Intravenous  Once 08/29/21 1214 08/29/21 1332       Objective: Vitals:   09/14/21 0756 09/14/21 1109  BP: (!) 146/79 130/73  Pulse: 80 75  Resp: 18 18  Temp: 98.1 F (36.7 C) 98.2 F (36.8 C)  SpO2: 100% 99%    Intake/Output Summary (Last 24 hours) at 09/14/2021 1128 Last data filed at 09/14/2021 0511 Gross per 24 hour  Intake 240 ml  Output 1225 ml  Net -985 ml   Filed Weights   08/29/21 1125 09/02/21 1206 09/04/21 1204  Weight: 87.5 kg 87.5 kg 88.5 kg   Weight change:  Body mass index is 33.47 kg/m.   Physical Exam: General exam: Middle-aged Caucasian male.  Not in physical distress Skin: No rashes, lesions or ulcers. HEENT: Atraumatic, normocephalic, no obvious bleeding Lungs: Clear to auscultation bilaterally CVS: Regular rate and rhythm, no murmur GI/Abd soft, nontender, nondistended, bowel sound present CNS: Alert, awake, oriented x3 Psychiatry: Mood appropriate Extremities: Left AKA stump with wound VAC on  Data Review: I have personally reviewed the laboratory data and studies available.  F/u labs ordered Unresulted Labs (From admission, onward)     Start     Ordered   09/04/21 0500  Renal function panel  Daily,   R      Question:  Specimen collection method  Answer:  Lab=Lab collect   09/02/21 1917            Signed, Terrilee Croak, MD [facility] 09/14/2021

## 2021-09-15 ENCOUNTER — Inpatient Hospital Stay (HOSPITAL_COMMUNITY): Payer: Medicare Other

## 2021-09-15 HISTORY — PX: IR REMOVAL TUN CV CATH W/O FL: IMG2289

## 2021-09-15 LAB — CBC
HCT: 25.4 % — ABNORMAL LOW (ref 39.0–52.0)
Hemoglobin: 8 g/dL — ABNORMAL LOW (ref 13.0–17.0)
MCH: 28.7 pg (ref 26.0–34.0)
MCHC: 31.5 g/dL (ref 30.0–36.0)
MCV: 91 fL (ref 80.0–100.0)
Platelets: 275 10*3/uL (ref 150–400)
RBC: 2.79 MIL/uL — ABNORMAL LOW (ref 4.22–5.81)
RDW: 16.5 % — ABNORMAL HIGH (ref 11.5–15.5)
WBC: 11.8 10*3/uL — ABNORMAL HIGH (ref 4.0–10.5)
nRBC: 0 % (ref 0.0–0.2)

## 2021-09-15 LAB — TYPE AND SCREEN
ABO/RH(D): O POS
Antibody Screen: NEGATIVE
Unit division: 0

## 2021-09-15 LAB — RENAL FUNCTION PANEL
Albumin: 2.4 g/dL — ABNORMAL LOW (ref 3.5–5.0)
Anion gap: 8 (ref 5–15)
BUN: 35 mg/dL — ABNORMAL HIGH (ref 6–20)
CO2: 24 mmol/L (ref 22–32)
Calcium: 8.8 mg/dL — ABNORMAL LOW (ref 8.9–10.3)
Chloride: 107 mmol/L (ref 98–111)
Creatinine, Ser: 2.78 mg/dL — ABNORMAL HIGH (ref 0.61–1.24)
GFR, Estimated: 27 mL/min — ABNORMAL LOW (ref >60)
Glucose, Bld: 119 mg/dL — ABNORMAL HIGH (ref 70–99)
Phosphorus: 3.4 mg/dL (ref 2.5–4.6)
Potassium: 5.3 mmol/L — ABNORMAL HIGH (ref 3.5–5.1)
Sodium: 139 mmol/L (ref 135–145)

## 2021-09-15 LAB — BPAM RBC
Blood Product Expiration Date: 202307062359
ISSUE DATE / TIME: 202306191121
Unit Type and Rh: 5100

## 2021-09-15 LAB — BASIC METABOLIC PANEL
Anion gap: 6 (ref 5–15)
BUN: 34 mg/dL — ABNORMAL HIGH (ref 6–20)
CO2: 23 mmol/L (ref 22–32)
Calcium: 8.7 mg/dL — ABNORMAL LOW (ref 8.9–10.3)
Chloride: 109 mmol/L (ref 98–111)
Creatinine, Ser: 2.69 mg/dL — ABNORMAL HIGH (ref 0.61–1.24)
GFR, Estimated: 28 mL/min — ABNORMAL LOW (ref >60)
Glucose, Bld: 103 mg/dL — ABNORMAL HIGH (ref 70–99)
Potassium: 5.3 mmol/L — ABNORMAL HIGH (ref 3.5–5.1)
Sodium: 138 mmol/L (ref 135–145)

## 2021-09-15 LAB — GLUCOSE, CAPILLARY
Glucose-Capillary: 106 mg/dL — ABNORMAL HIGH (ref 70–99)
Glucose-Capillary: 94 mg/dL (ref 70–99)

## 2021-09-15 MED ORDER — SODIUM ZIRCONIUM CYCLOSILICATE 10 G PO PACK
10.0000 g | PACK | Freq: Once | ORAL | Status: AC
Start: 2021-09-15 — End: 2021-09-15
  Administered 2021-09-15: 10 g via ORAL
  Filled 2021-09-15: qty 1

## 2021-09-15 NOTE — Progress Notes (Addendum)
Mobility Specialist Progress Note:   09/14/21 1400  Mobility  Activity Transferred from chair to bed  Level of Assistance Contact guard assist, steadying assist  Assistive Device None  Activity Response Tolerated well  $Mobility charge 1 Mobility   Pt requesting to get back to bed d/t buttock pain. Pt able to scoot from drop arm recliner to bed with minor use of RLE, primarily using BUE strength. Required a close min guard for safety, however no physical assistance required. Pt back in bed with all needs met.   [name] Acute Rehab Secure Chat or Office Phone: [phone]

## 2021-09-15 NOTE — Progress Notes (Signed)
Mobility Specialist Progress Note:   09/15/21 1050  Mobility  Activity  (HEP handout)  Level of Assistance Independent  Assistive Device None  Activity Response Tolerated well  $Mobility charge 1 Mobility   Pt received in buttock pain, declining OOB mobility at this time. Performed HEP handout, Pt tolerated well. Left with all needs met.   [name] Acute Rehab Secure Chat or Office Phone: [phone]

## 2021-09-15 NOTE — TOC Transition Note (Signed)
Transition of Care [facility]) - CM/SW Discharge Note   Patient Details  Name: [name] MRN: [id number] Date of Birth: 07-Sep-1970  [facility] CM/SW Contact:  Tresa Endo Phone Number: 09/15/2021, 10:01 AM   Clinical Narrative:    Patient will DC to: [facility] Anticipated DC date: 09/15/2021 Family notified: Pt Significant Other Transport by: Corey Harold   Per MD patient ready for DC to [facility]. RN to call report prior to discharge [phone]). RN, patient, patient's family, and facility notified of DC. Discharge Summary and FL2 sent to facility. DC packet on chart. Ambulance transport requested for patient.   CSW will sign off for now as social work intervention is no longer needed. Please consult [place] again if new needs arise.       Barriers to Discharge: Continued Medical Work up   Patient Goals and CMS Choice Patient states their goals for this hospitalization and ongoing recovery are:: "I want to go home" CMS Medicare.gov Compare Post Acute Care list provided to:: Patient Choice offered to / list presented to : Sibling, Patient  Discharge Placement                       Discharge Plan and Services In-house Referral: Clinical Social Work   Post Acute Care Choice: [place]                               Social Determinants of Health (SDOH) Interventions     Readmission Risk Interventions     No data to display

## 2021-09-15 NOTE — Progress Notes (Addendum)
Tried calling [facility] RN X2 at [phone] to give report without answer.  Report given to [employer]

## 2021-09-15 NOTE — Progress Notes (Signed)
Mobility Specialist Progress Note:   09/15/21 1530  Mobility  Activity  (bed mobility)  Range of Motion/Exercises Active;Passive;All extremities  Assistive Device None  Activity Response Tolerated well  $Mobility charge 1 Mobility   RN requesting assistance with bed mobility. Tolerated well. Pt left with all needs met, eager for d/c.  [name] Acute Rehab Secure Chat or Office Phone: [phone]

## 2021-09-15 NOTE — Discharge Summary (Signed)
Physician Discharge Summary  [name] AOZ:[id number] DOB: 05/13/1970 DOA: 08/29/2021  PCP: Camillia Herter, NP  Admit date: 08/29/2021 Discharge date: 09/15/2021  Admitted From: Home Discharge disposition: SNF  Recommendations at discharge:  Continue wound VAC recommendation per orthopedics Follow-up with orthopedics as an outpatient.  Brief narrative: [name] is a 51 y.o. 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 Patient presented to the ED on 08/29/2021 with complaint of LEFT foot pain, draining wound.  Initial labs showed creatinine elevated to 5.2 against a baseline of 2.5. MRI left foot showed osteomyelitis of the cuboid and fifth metatarsal.  Podiatry consult obtained.  No salvage operations were just possible. 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 Intra-Op, patient was noted to have purulent abscess that extended up to the popliteal fossa with necrotic muscle along the medial head of the gastrocnemius muscle and medial soleus.   -6/9, patient was taken back to the OR.  He was noted to have purulence draining from below the knee amputation and hence BKA was converted to AKA.  Margins are noted to be clean. -6/9, underwent tunneled central line placement by IR. See below for details  Subjective: Patient was seen and examined this morning. Lying on bed.  Not in distress.  No new symptoms.  No fever.  WBC continues to improve.  Hemoglobin improved after 1 unit of PRBC transfusion yesterday.  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 -With the concern of left lower extremity infection extending upwards, on 6/15, patient underwent left thigh/hip CT scan which not show any abscess or signs of septic arthritis.  -Per ID recommendation, patient was treated with daptomycin and cefazolin for 7 days postsurgery.  Last dose of antibiotics today 6/17.  Currently off antibiotics.  -No fever.  WBC count gradually improving for last 5 days. -No discharge in the wound VAC canister.  Discussed with Dr. Sharol Given this morning.  Wound VAC removed at discharge.  To continue dry dressing at the facility. 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  TTE recommended today did not show any evidence of vegetation.  Acute kidney injury superimposed on CKD 4 -Creatinine was elevated up to 5.2 on admission, basleine around 2.5-2.8 since Jan. -Nephrology consultation was obtained.  Patient was started on bicarb drip.  Renal function gradually improved, and is currently in baseline range.   -To follow-up with nephrologist Dr. [name] after discharge. Recent Labs    09/07/21 0505 09/08/21 0436 09/09/21 0430 09/10/21 0450 09/11/21 0457 09/12/21 0405 09/13/21 0325 09/14/21 0401 09/15/21  5537 09/15/21 0852  BUN 61* 55* 48* 44* 36* 36* 38* 34* 35* 34*  CREATININE 2.77* 2.65* 2.48* 2.42* 2.56* 2.85* 2.84* 2.88* 2.78* 2.69*   Acute postoperative anemia due to expected blood loss - Postoperatively hemoglobin trended down to the lowest of 6.6  on 6/12.  2 units of PRBC were transfused.  Noted a drop in hemoglobin in last 3 days.  No active bleeding.  Hemoglobin was low at 7.2 yesterday.  No active bleeding.  Improved to 8 today with 1 unit of transfusion.  Continue Protonix. Recent Labs    10/14/20 1428 10/17/20 1511 01/13/21 0938 01/13/21 0939 09/06/21 0945 09/07/21 0505 09/11/21 0457 09/12/21 0405 09/13/21 0325 09/14/21 0401 09/15/21 0852  HGB  --    < >  --    < >  --    < > 9.0* 8.0* 7.7* 7.2* 8.0*  MCV  --    < >  --    < >  --    < > 88.7 91.8 89.2 90.4 91.0  VITAMINB12  --   --   --   --  685  --   --   --   --   --   --   FOLATE 7.1  --  8.1  --  7.5  --   --   --   --   --   --   FERRITIN  --   --  159  --  525*  --   --   --   --   --   --   TIBC  --   --  269  --  NOT CALCULATED  --   --   --   --   --   --   IRON  --   --  54  --  75  --   --   --   --   --   --    < > = values in this interval not displayed.   Hyperkalemia -Patient is not on regular potassium supplement.  Potassium level elevated today to 5.3.  1 dose of Lokelma given. Recent Labs  Lab 09/11/21 0457 09/12/21 0405 09/13/21 0325 09/14/21 0401 09/15/21 0328 09/15/21 0852  K 4.6 5.1 4.8 4.9 5.3* 5.3*  PHOS 3.1 3.7 3.0 3.6 3.4  --    Tunneled central line in place -6/9, underwent tunneled central line placement by IR.  I believe it was with an anticipation that patient would require long-term antibiotics.  Patient is currently off antibiotics.  IR to remove central and prior to discharge today.  Type 2 diabetes mellitus -A1c 7.5 on 6/4 -Currently on Lantus 10 units daily, sliding scale insulin with Accu-Cheks Recent Labs  Lab 09/14/21 1349 09/14/21 1556 09/14/21 2142 09/15/21 0811 09/15/21 1107  GLUCAP 153* 117* 94 106* 94   Essential hypertension -Currently blood pressure controlled with amlodipine, Coreg, hydralazine -Clonidine has been stopped  CAD/HLD -Continue aspirin, beta-blocker and statin  Severe protein-calorie  malnutrition -Albumin was at  < 1.5. -IV albumin was given.  Dietitian consulted. -continue nutritional supplementation with Ensure.    Goals of care   Code Status: Full Code    Mobility: Encourage ambulation.  SNF recommended PT  Skin assessment:     Nutritional status:  Body mass index is 33.47 kg/m.  Nutrition Problem: Increased nutrient needs Etiology: acute illness, wound healing Signs/Symptoms: estimated needs      Wounds:  - Incision (Closed) 09/04/21 Leg (Active)  Date First Assessed/Time First Assessed: 09/04/21 1523   Location: Leg    Assessments 09/04/2021  3:55 PM 09/15/2021  8:00 AM  Dressing Type Negative pressure wound therapy Negative pressure wound therapy  Dressing -- Clean, Dry, Intact  Site / Wound Assessment -- Dressing in place / Unable to assess  Drainage Amount None None     No associated orders.     Negative Pressure Wound Therapy Knee Anterior;Left (Active)  Placement Date/Time: 09/04/21 1523   Wound Type: Surgical (Open wound)  Location: Knee  Location Orientation: Anterior;Left    Assessments 09/04/2021  3:55 PM 09/15/2021  8:00 AM  Site / Wound Assessment -- Dressing in place / Unable to assess  Peri-wound Assessment -- Other (Comment)  Cycle Continuous --  Target Pressure (mmHg) 125 --  Dressing Status Intact --  Drainage Amount None --  Output (mL) 0 mL --     No associated orders.     Wound / Incision (Open or Dehisced) 09/11/21 Skin tear Sacrum (Active)  Date First Assessed/Time First Assessed: 09/11/21 0945   Wound Type: Skin tear  Location: Sacrum    Assessments 09/11/2021  4:50 PM 09/15/2021  8:00 AM  Dressing Type Foam - Lift dressing to assess site every shift Foam - Lift dressing to assess site every shift  Dressing Changed New Reinforced  Dressing Status Clean, Dry, Intact Clean, Dry, Intact  Dressing Change Frequency PRN --  Site / Wound Assessment Pink --  Peri-wound Assessment Intact Intact     No associated orders.     Discharge Exam:   Vitals:   09/14/21 2017 09/14/21 2136 09/15/21 0611 09/15/21 0812  BP: (!) 142/82 (!) 142/79 (!) 144/82 124/67  Pulse: 77 73 79 74  Resp: 18 18 18 17   Temp: 98.2 F (36.8 C) 98 F (36.7 C) 98.1 F (36.7 C) 98.3 F (36.8 C)  TempSrc: Oral Oral Oral Oral  SpO2: 99%  100% 100%  Weight:      Height:        Body mass index is 33.47 kg/m.  General exam: Middle-aged Caucasian male.  Not in physical distress Skin: No rashes, lesions or ulcers. HEENT: Atraumatic, normocephalic, no obvious bleeding Lungs: Clear to auscultation bilaterally CVS: Regular rate and rhythm, no murmur GI/Abd soft, nontender, nondistended, bowel sound present CNS: Alert, awake, oriented x3 Psychiatry: Mood appropriate Extremities: Left AKA stump with wound VAC on.   Follow ups:    Follow-up Information     [name], MD Follow up in 1 week(s).   Specialty: Orthopedic Surgery Contact information: [place] [address] [phone]         Camillia Herter, NP Follow up.   Specialty: Nurse Practitioner Contact information: [place] [address] [phone]         Jettie Booze, MD .   Specialties: Cardiology, Radiology, Interventional Cardiology Contact information: [address] [place] [address] [phone]                 Discharge Instructions:   Discharge Instructions     Call MD for:  difficulty breathing, headache or visual disturbances   Complete by: As directed    Call MD for:  extreme fatigue   Complete by: As directed    Call MD for:  hives   Complete by: As directed    Call MD for:  persistant dizziness or light-headedness   Complete by: As directed  Call MD for:  persistant nausea and vomiting   Complete by: As directed    Call MD for:  severe uncontrolled pain   Complete by: As directed    Call MD for:  temperature >100.4   Complete by: As directed     Diet Carb Modified   Complete by: As directed    Discharge instructions   Complete by: As directed    Recommendations at discharge:   Continue wound VAC recommendation per orthopedics  Follow-up with orthopedics as an outpatient.  Discharge instructions for diabetes mellitus: Check blood sugar 3 times a day and bedtime at home. If blood sugar running above 200 or less than 70 please call your MD to adjust insulin. If you notice signs and symptoms of hypoglycemia (low blood sugar) like jitteriness, confusion, thirst, tremor and sweating, please check blood sugar, drink sugary drink/biscuits/sweets to increase sugar level and call MD or return to ER.    General discharge instructions: Follow with Primary MD Camillia Herter, NP in 7 days  Please request your PCP  to go over your hospital tests, procedures, radiology results at the follow up. Please get your medicines reviewed and adjusted.  Your PCP may decide to repeat certain labs or tests as needed. Do not drive, operate heavy machinery, perform activities at heights, swimming or participation in water activities or provide baby sitting services if your were admitted for syncope or siezures until you have seen by Primary MD or a Neurologist and advised to do so again. [place] Controlled Substance Reporting System database was reviewed. Do not drive, operate heavy machinery, perform activities at heights, swim, participate in water activities or provide baby-sitting services while on medications for pain, sleep and mood until your outpatient physician has reevaluated you and advised to do so again.  You are strongly recommended to comply with the dose, frequency and duration of prescribed medications. Activity: As tolerated with Full fall precautions use walker/cane & assistance as needed Avoid using any recreational substances like cigarette, tobacco, alcohol, or non-prescribed drug. If you experience worsening of your admission  symptoms, develop shortness of breath, life threatening emergency, suicidal or homicidal thoughts you must seek medical attention immediately by calling 911 or calling your MD immediately  if symptoms less severe. You must read complete instructions/literature along with all the possible adverse reactions/side effects for all the medicines you take and that have been prescribed to you. Take any new medicine only after you have completely understood and accepted all the possible adverse reactions/side effects.  Wear Seat belts while driving. You were cared for by a hospitalist during your hospital stay. If you have any questions about your discharge medications or the care you received while you were in the hospital after you are discharged, you can call the unit and ask to speak with the hospitalist or the covering physician. Once you are discharged, your primary care physician will handle any further medical issues. Please note that NO REFILLS for any discharge medications will be authorized once you are discharged, as it is imperative that you return to your primary care physician (or establish a relationship with a primary care physician if you do not have one).   Discharge wound care:   Complete by: As directed    Increase activity slowly   Complete by: As directed        Discharge Medications:   Allergies as of 09/15/2021       Reactions   Bee Venom Swelling  SWELLING REACTION UNSPECIFIED    Penicillins Hives, Other (See Comments)   Full body hives as a child with no shortness of breath or swelling  **Can tolerate cephalosporins**   Clonidine Derivatives Other (See Comments)   Pt states "It is making me dizzy"        Medication List     STOP taking these medications    cloNIDine 0.1 MG tablet Commonly known as: CATAPRES   Lantus SoloStar 100 UNIT/ML Solostar Pen Generic drug: insulin glargine   oxyCODONE-acetaminophen 5-325 MG tablet Commonly known as: Percocet        TAKE these medications    Accu-Chek FastClix Lancets Misc 1 Package by Does not apply route as directed. Use as instructed to test blood sugar 2 times daily E11.65   Acetaminophen Extra Strength 500 MG tablet Generic drug: acetaminophen TAKE 2 TABLETS EVERY 6 HOURS AS NEEDED FOR PAIN What changed: See the new instructions.   amLODipine 10 MG tablet Commonly known as: NORVASC Take 1 tablet (10 mg total) by mouth daily. What changed: when to take this   Aspirin Adult Low Strength 81 MG tablet Generic drug: aspirin EC TAKE 1 TABLET BY MOUTH EVERY MORNING What changed: how much to take   atorvastatin 20 MG tablet Commonly known as: LIPITOR TAKE 1 TABLET BY MOUTH DAILY What changed: when to take this   blood glucose meter kit and supplies Kit Dispense based on patient and insurance preference. Use up to four times daily as directed. (FOR ICD-9 250.00, 250.01). What changed:  how much to take how to take this when to take this   Blood Pressure Monitor Devi Please provide patient with insurance approved blood pressure monitor. I10.0 What changed:  how much to take how to take this when to take this   carvedilol 25 MG tablet Commonly known as: COREG TAKE 1 TABLET BY MOUTH TWICE  DAILY What changed: additional instructions   docusate sodium 100 MG capsule Commonly known as: COLACE Take 1 capsule (100 mg total) by mouth daily.   feeding supplement Liqd Take 237 mLs by mouth 3 (three) times daily between meals.   nutrition supplement (JUVEN) Pack Take 1 packet by mouth 2 (two) times daily between meals.   ferrous sulfate 325 (65 FE) MG EC tablet TAKE 1 TABLET BY MOUTH EVERY MORNING   FreeStyle Libre 2 Reader Devi Monitor blood glucose levels 4-5 times per day. ICD10 E11.42 Z79.4 What changed:  how much to take how to take this when to take this   FreeStyle Libre 2 Sensor Misc Monitor blood glucose levels 4-5 times per day. ICD10 E11.42 Z79.4 What changed:   how much to take how to take this when to take this   gabapentin 300 MG capsule Commonly known as: Neurontin Take 1 capsule (300 mg total) by mouth 3 (three) times daily. Patient needs office visit before refills will be given What changed: additional instructions   glucose blood test strip Use as instructed to test blood sugar 2 times daily E11.65 What changed:  how much to take how to take this when to take this   insulin aspart 100 UNIT/ML injection Commonly known as: novoLOG Inject 0-15 Units into the skin 3 (three) times daily with meals.   insulin glargine-yfgn 100 UNIT/ML injection Commonly known as: SEMGLEE Inject 0.1 mLs (10 Units total) into the skin daily.   Insulin Pen Needle 32G X 4 MM Misc Use as instructed. Inject into the skin three time daily. What changed:  how much to take how to take this when to take this   Misc. Devices Misc Please provide patient with insurance approved diabetic shoes. E11.65 What changed:  how much to take how to take this when to take this   multivitamin with minerals Tabs tablet Take 1 tablet by mouth daily.   oxyCODONE 5 MG immediate release tablet Commonly known as: Oxy IR/ROXICODONE Take 1 tablet (5 mg total) by mouth every 6 (six) hours as needed for up to 5 days for moderate pain (pain score 4-6).   pantoprazole 40 MG tablet Commonly known as: PROTONIX Take 1 tablet (40 mg total) by mouth daily.   polyethylene glycol 17 g packet Commonly known as: MIRALAX / GLYCOLAX Take 17 g by mouth daily as needed (constipation).   senna 8.6 MG Tabs tablet Commonly known as: SENOKOT TAKE 1 TABLET BY MOUTH DAILY AS NEEDED FOR CONSTIPATION What changed: See the new instructions.   triamcinolone cream 0.1 % Commonly known as: KENALOG Apply 1 application topically 2 (two) times daily. What changed:  when to take this reasons to take this               Discharge Care Instructions  (From admission, onward)            Start     Ordered   09/15/21 0000  Discharge wound care:        09/15/21 1006             The results of significant diagnostics from this hospitalization (including imaging, microbiology, ancillary and laboratory) are listed below for reference.    Procedures and Diagnostic Studies:   MR FOOT LEFT WO CONTRAST  Result Date: 08/29/2021 CLINICAL DATA:  Foot swelling, diabetic, osteomyelitis suspected, xray done EXAM: MRI OF THE LEFT FOOT WITHOUT CONTRAST TECHNIQUE: Multiplanar, multisequence MR imaging of the left forefoot was performed. No intravenous contrast was administered. COMPARISON:  X-ray 08/29/2021, 06/28/2019, MRI 06/29/2019 FINDINGS: Technical Note: Despite efforts by the technologist and patient, motion artifact is present on today's exam and could not be eliminated. This reduces exam sensitivity and specificity. Bones/Joint/Cartilage Bone marrow edema within the base and proximal diaphysis of the fifth metatarsal with intermediate T1 marrow signal and erosion. Small fifth TMT joint effusion concerning for septic arthritis. Patchy marrow edema within the distal cuboid is also concerning for osteomyelitis. 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 Mild marrow edema within the first through fourth metatarsal bases and within the cuneiform bones which may be degenerative/reactive. Osteomyelitis at these locations would be difficult to exclude by imaging. Prior great toe amputation. No evidence of acute fracture. Unchanged alignment at the TMT joints. Ligaments Chronic Lisfranc ligament disruption. No evidence of acute ligamentous injury. Muscles and Tendons Large fluid collection within the plantar aspect of the forefoot and midfoot insinuated around the flexor tendons suggesting large volume tenosynovitis. Fluid focally measures up to 9.2 x 2.3 x 3.0 cm. Chronic denervation changes of the foot musculature. Soft tissues Ulceration at the  plantar lateral aspect of the proximal forefoot underlying the fifth TMT joint level. Extensive surrounding soft tissue edema. IMPRESSION: 1. Ulceration at the plantar-lateral aspect of the proximal forefoot underlying the fifth TMT joint level. Findings suspicious for acute osteomyelitis involving the proximal fifth metatarsal and distal cuboid. 2. Small fifth TMT joint effusion concerning for septic arthritis. 3. Large fluid collection within the plantar aspect of the forefoot and midfoot insinuated around the flexor tendons suggesting large volume flexor tenosynovitis, which  could be septic or aseptic. 4. 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 Mild marrow edema within the first through fourth metatarsal bases and within the cuneiform bones which may be degenerative/reactive. Osteomyelitis at these locations would be difficult to exclude by imaging. Electronically Signed   By: Davina Poke D.O.   On: 08/29/2021 17:47   DG Foot Complete Left  Result Date: 08/29/2021 CLINICAL DATA:  LEFT foot pain and infection. History of prior osteomyelitis. Diabetes. EXAM: LEFT FOOT - COMPLETE 3+ VIEW COMPARISON:  04/13/2021 MR, 07/22/2020 radiograph and prior studies FINDINGS: Cortical disruption and irregularity at the base of the 5th metatarsal is noted compatible with fracture/osteomyelitis. Osteolysis of the LATERAL aspect of the cuboid again noted compatible with abscess/osteomyelitis as identified on prior MR. Soft tissue swelling along the foot identified with soft tissue defect along the plantar aspect. Juxta-articular sclerosis and moderate-severe chronic joint changes/subluxation at the 1st, 2nd and 3rd tarsometatarsal joints again noted. Severe degenerative changes at the 2nd MTP joint noted and remote metatarsal fractures again identified. 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 Moderate plantar calcaneal spurring vascular calcifications are again identified.  IMPRESSION: 1. Cortical disruption and irregularity at the base of the 5th metatarsal compatible with fracture/osteomyelitis. 2. Osteolysis of the LATERAL aspect of the cuboid again noted compatible with abscess/osteomyelitis as identified on prior MR. 3. Soft tissue swelling with plantar soft tissue defect. 4. Chronic changes involving the 1st, 2nd and 3rd tarsometatarsal joints. Electronically Signed   By: Margarette Canada M.D.   On: 08/29/2021 12:56     Labs:   Basic Metabolic Panel: Recent Labs  Lab 09/11/21 0457 09/12/21 0405 09/13/21 0325 09/14/21 0401 09/15/21 0328 09/15/21 0852  NA 139 138 138 138 139 138  K 4.6 5.1 4.8 4.9 5.3* 5.3*  CL 106 108 106 110 107 109  CO2 25 21* 25 23 24 23   GLUCOSE 98 86 147* 79 119* 103*  BUN 36* 36* 38* 34* 35* 34*  CREATININE 2.56* 2.85* 2.84* 2.88* 2.78* 2.69*  CALCIUM 8.5* 8.5* 8.5* 8.4* 8.8* 8.7*  PHOS 3.1 3.7 3.0 3.6 3.4  --    GFR Estimated Creatinine Clearance: 32.6 mL/min (A) (by C-G formula based on SCr of 2.69 mg/dL (H)). Liver Function Tests: Recent Labs  Lab 09/11/21 0457 09/12/21 0405 09/13/21 0325 09/14/21 0401 09/15/21 0328  ALBUMIN 2.6* 2.7* 2.4* 2.3* 2.4*   No results for input(s): "LIPASE", "AMYLASE" in the last 168 hours. No results for input(s): "AMMONIA" in the last 168 hours. Coagulation profile No results for input(s): "INR", "PROTIME" in the last 168 hours.  CBC: Recent Labs  Lab 09/10/21 0450 09/11/21 0457 09/12/21 0405 09/13/21 0325 09/14/21 0401 09/15/21 0852  WBC 25.1* 20.3* 17.6* 16.6* 12.9* 11.8*  NEUTROABS 21.2* 16.2* 14.3* 12.7* 9.1*  --   HGB 9.7* 9.0* 8.0* 7.7* 7.2* 8.0*  HCT 30.5* 28.3* 25.9* 24.0* 22.5* 25.4*  MCV 88.4 88.7 91.8 89.2 90.4 91.0  PLT 376 349 314 303 279 275   Cardiac Enzymes: Recent Labs  Lab 09/11/21 0457  CKTOTAL 41*   BNP: Invalid input(s): "POCBNP" CBG: Recent Labs  Lab 09/14/21 1349 09/14/21 1556 09/14/21 2142 09/15/21 0811 09/15/21 1107  GLUCAP 153* 117*  94 106* 94   D-Dimer No results for input(s): "DDIMER" in the last 72 hours. Hgb A1c No results for input(s): "HGBA1C" in the last 72 hours. Lipid Profile No results for input(s): "CHOL", "HDL", "LDLCALC", "TRIG", "CHOLHDL", "LDLDIRECT" in the last 72 hours. Thyroid function  studies No results for input(s): "TSH", "T4TOTAL", "T3FREE", "THYROIDAB" in the last 72 hours.  Invalid input(s): "FREET3" Anemia work up No results for input(s): "VITAMINB12", "FOLATE", "FERRITIN", "TIBC", "IRON", "RETICCTPCT" in the last 72 hours. Microbiology No results found for this or any previous visit (from the past 240 hour(s)).  Time coordinating discharge: 35 minutes  Signed: Shonice Wrisley  [facility] 09/15/2021, 1:17 PM

## 2021-09-15 NOTE — Procedures (Signed)
IR requested to remove tunneled central venous catheter. Line removed - please see full dictation under imaging tab in Epic.  Soyla Dryer, [place] [phone] 09/15/2021, 12:40 PM

## 2021-09-15 NOTE — Progress Notes (Signed)
OT Cancellation Note  Patient Details Name: [name] MRN: [id number] DOB: May 13, 1970   Cancelled Treatment:    Reason Eval/Treat Not Completed: Pain limiting ability to participate;Patient declined, no reason specified Pt politely declined OT session this AM, citing pain. Will follow up in PM as schedule permits.  [name] 09/15/2021, 9:56 AM

## 2021-09-15 NOTE — Progress Notes (Signed)
OT Cancellation Note  Patient Details Name: [name] MRN: [id number] DOB: 11-06-1970   Cancelled Treatment:    Reason Eval/Treat Not Completed: Patient declined, no reason specified Re-attempted though pt declined OT session, citing plan to DC to rehab soon.  Layla Maw 09/15/2021, 1:57 PM

## 2021-09-23 ENCOUNTER — Encounter: Payer: 59 | Admitting: Podiatry

## 2021-09-24 ENCOUNTER — Ambulatory Visit (INDEPENDENT_AMBULATORY_CARE_PROVIDER_SITE_OTHER): Payer: Medicare Other | Admitting: Orthopedic Surgery

## 2021-09-24 DIAGNOSIS — Z89612 Acquired absence of left leg above knee: Secondary | ICD-10-CM

## 2021-09-24 IMAGING — MR MR FOOT*L* WO/W CM
9 series · 40 of 40 positions shown · IV contrast (GADAVIST)
Comparison: MRI 05/21/2018 and radiographs 06/28/2019

CLINICAL DATA: Foot pain and swelling.

EXAM:
MRI OF THE LEFT FOREFOOT WITHOUT AND WITH CONTRAST
TECHNIQUE: Multiplanar, multisequence MR imaging of the left foot was performed
both before and after administration of intravenous contrast.
CONTRAST:  8mL GADAVIST GADOBUTROL 1 MMOL/ML IV SOLN

[Series 4: T1 · coronal · left · 3.5mm · 0.47mm/px · 6 of 36 slices shown (1 of 2)]
[im 1/36]
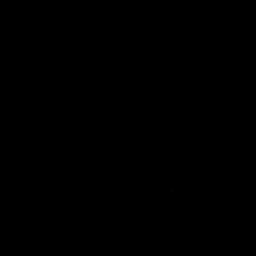
[im 8/36]
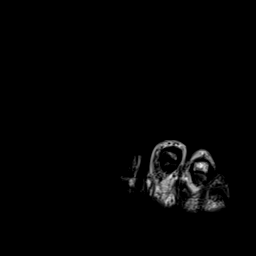
[im 15/36]
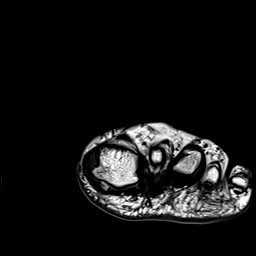
[im 22/36]
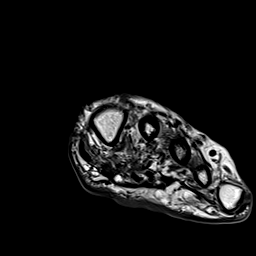
[im 29/36]
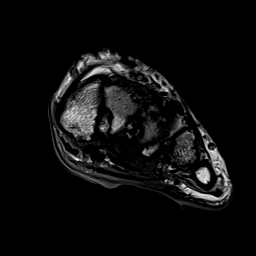
[im 36/36]
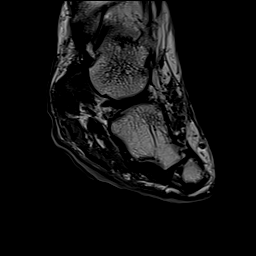

[Series 5: T2 fat-sat · coronal · left · 3.5mm · 0.47mm/px · 5 of 36 slices shown (1 of 2)]
[im 1/36]
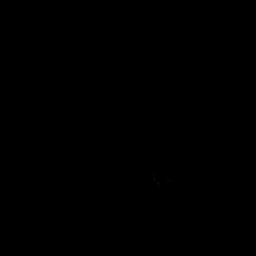
[im 9/36]
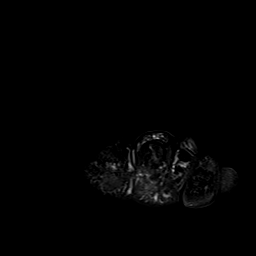
[im 18/36]
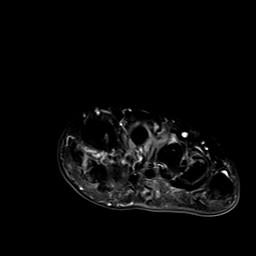
[im 27/36]
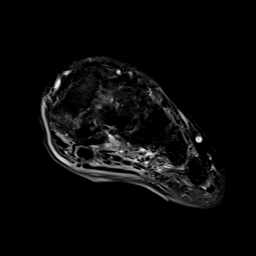
[im 36/36]
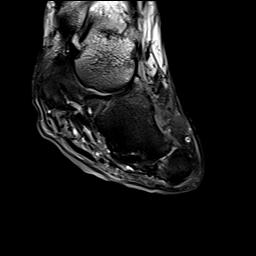

[Series 6: T2 fat-sat · axial · left · 3.0mm · 0.70mm/px · z∈[-80,+1]mm · 4 of 24 slices shown (2 of 2)]
[im 1/24]
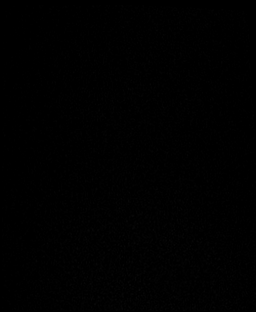
[im 8/24]
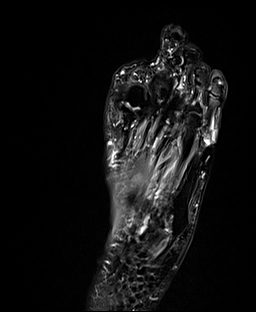
[im 16/24]
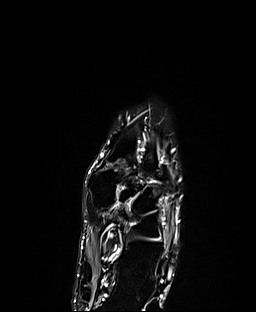
[im 24/24]
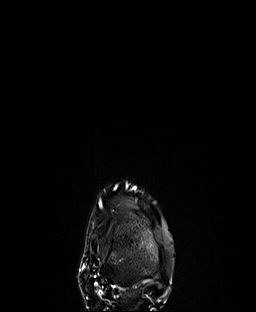

[Series 7: T1 · axial · left · 3.0mm · 0.70mm/px · z∈[-80,+1]mm · 4 of 24 slices shown (2 of 2)]
[im 1/24]
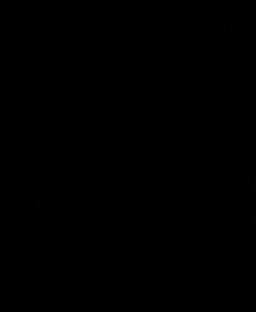
[im 8/24]
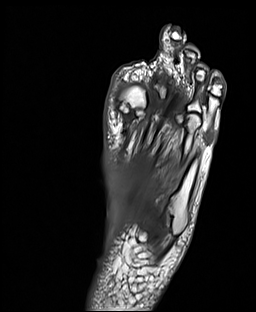
[im 16/24]
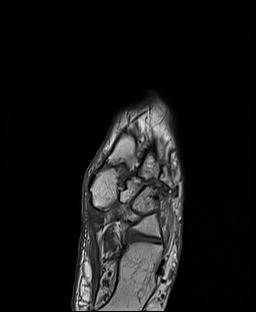
[im 24/24]
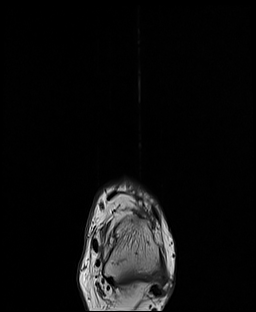

[Series 8: T1 fat-sat · coronal · non-contrast · left · 3.5mm · 0.47mm/px · 5 of 35 slices shown]
[im 1/35]
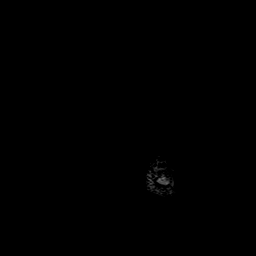
[im 9/35]
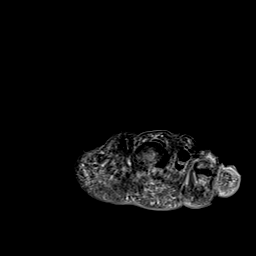
[im 18/35]
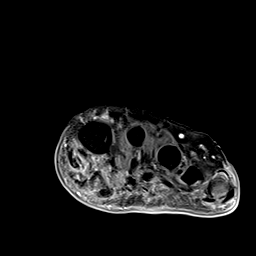
[im 26/35]
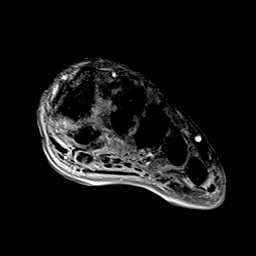
[im 35/35]
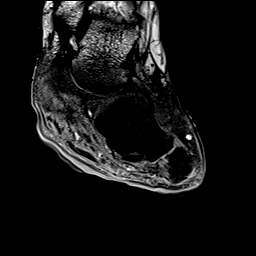

[Series 9: STIR · sagittal · left · 3.0mm · 0.37mm/px · 4 of 27 slices shown]
[im 1/27]
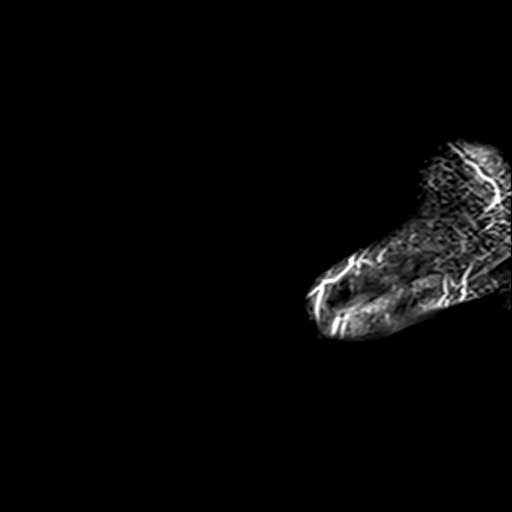
[im 9/27]
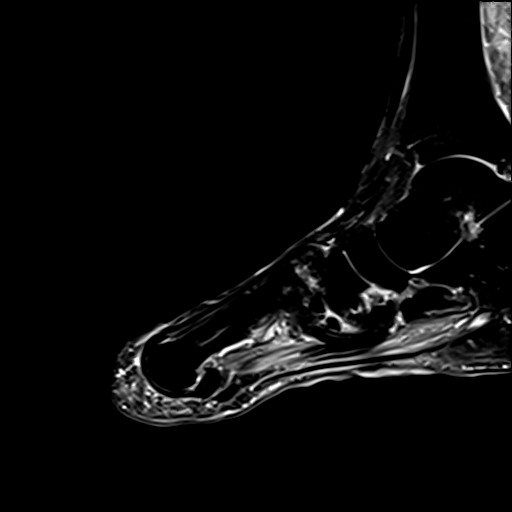
[im 18/27]
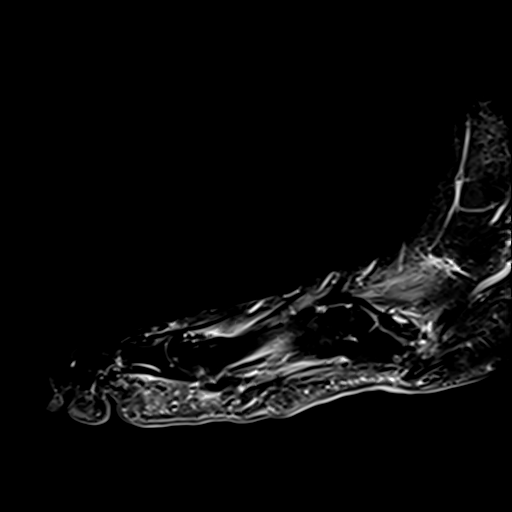
[im 27/27]
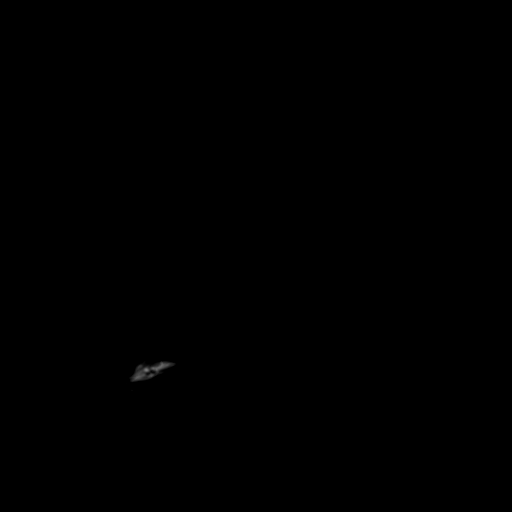

[Series 10: T1 fat-sat post-contrast · coronal · left · 3.5mm · 0.47mm/px · 5 of 36 slices shown (1 of 3)]
[im 1/36]
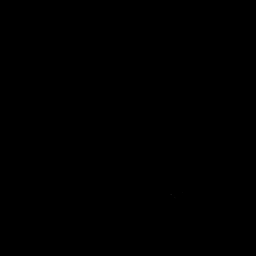
[im 9/36]
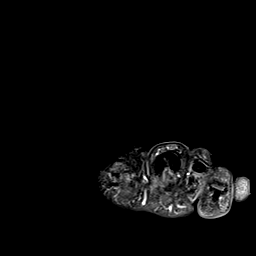
[im 18/36]
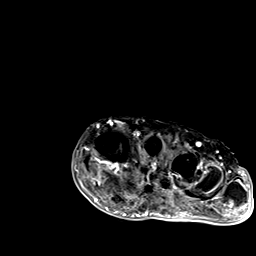
[im 27/36]
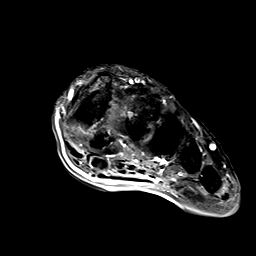
[im 36/36]
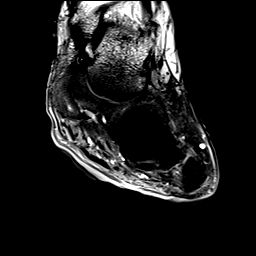

[Series 11: T1 fat-sat post-contrast · sagittal · left · 3.0mm · 0.35mm/px · 4 of 27 slices shown (2 of 3)]
[im 1/27]
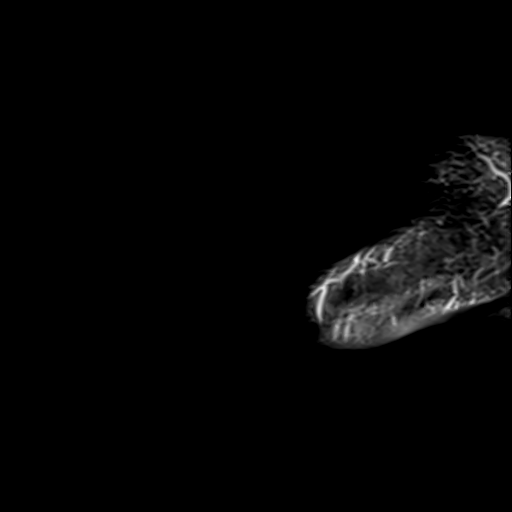
[im 9/27]
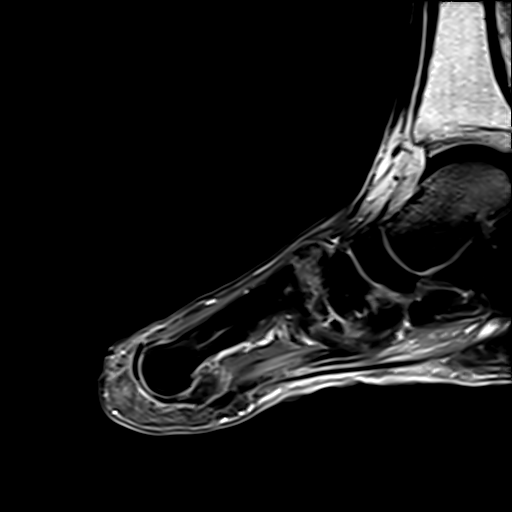
[im 18/27]
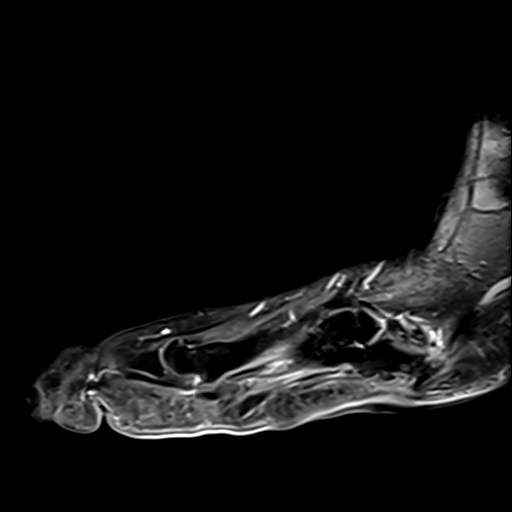
[im 27/27]
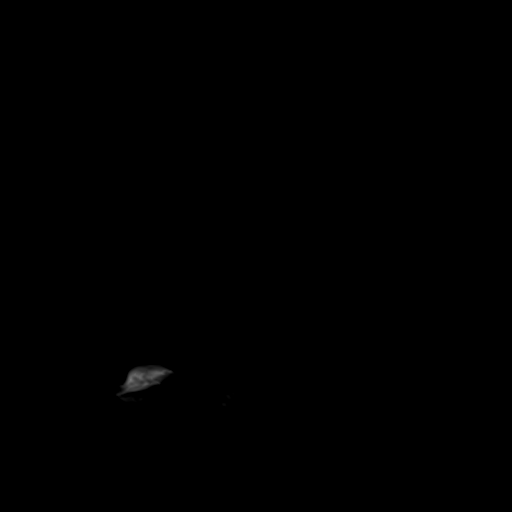

[Series 12: T1 fat-sat post-contrast · axial · left · 3.0mm · 0.70mm/px · z∈[-76,+2]mm · 3 of 23 slices shown (3 of 3)]
[im 1/23]
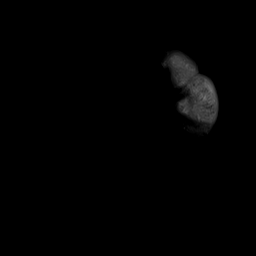
[im 12/23]
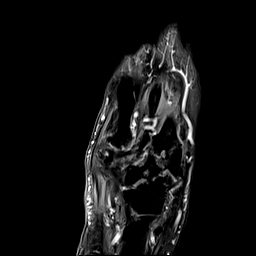
[im 23/23]
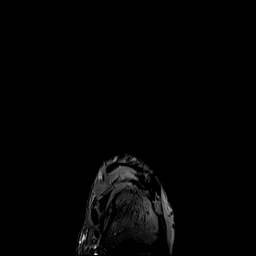

[40 of 40 positions shown; findings below may reference images not displayed]

FINDINGS: Surgical changes from prior right great toe amputation. Chronic
deformity of the second proximal phalanx. I do not see any findings
suspicious for septic arthritis or osteomyelitis.

Severe midfoot degenerative changes likely neuropathic.

There appears to be a large open wound on the plantar aspect of the
midfoot with possible surrounding blister formation. I do not see a
discrete drainable soft tissue abscess or significant cellulitis or
myofasciitis.
IMPRESSION: 1. Large open wound on the plantar aspect of the midfoot with
possible surrounding blister formation. No discrete drainable soft
tissue abscess or significant cellulitis.
2. No findings suspicious for septic arthritis or osteomyelitis.
3. Stable severe midfoot degenerative changes, likely neuropathic.

## 2021-09-25 ENCOUNTER — Encounter: Payer: Self-pay | Admitting: Orthopedic Surgery
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
## 2021-10-05 ENCOUNTER — Telehealth: Payer: Self-pay | Admitting: Family

## 2021-10-05 NOTE — Telephone Encounter (Signed)
Copied from [name]. Topic: General - Other >> Oct 05, 2021 11:34 AM [name] wrote: Reason for CRM: [facility] wanted to know if [name] will be able to sign home health orders for this pt.

## 2021-10-06 NOTE — Telephone Encounter (Signed)
Will forward [place] orders to provider once they are received in office

## 2021-10-07 ENCOUNTER — Other Ambulatory Visit: Payer: Self-pay

## 2021-10-07 ENCOUNTER — Encounter: Payer: Medicare Other | Admitting: Family

## 2021-10-07 ENCOUNTER — Telehealth: Payer: Self-pay | Admitting: Family

## 2021-10-07 DIAGNOSIS — Z794 Long term (current) use of insulin: Secondary | ICD-10-CM

## 2021-10-07 MED ORDER — FREESTYLE LIBRE 2 SENSOR MISC: 6 refills | Status: DC

## 2021-10-07 MED ORDER — FREESTYLE LIBRE 2 READER DEVI: 6 refills | Status: AC

## 2021-10-07 NOTE — Telephone Encounter (Signed)
Freestyle [employer] and sensors sent in for pt to [facility] on [address]

## 2021-10-07 NOTE — Telephone Encounter (Signed)
Att to contact Estill Bamberg at [facility] to give verbal orders no ans lvm

## 2021-10-07 NOTE — Telephone Encounter (Signed)
Copied from [place] [phone]. Topic: General - Other >> Oct 06, 2021  4:09 PM Everette C wrote: Reason for CRM: Estill Bamberg with [facility] has called on behalf of the patient to request a new prescription for a Glucse Monitoring Device  preferably a Freestyle Libre  Please send prescription to  [facility] ([facility] [place] [address] [place] ([place]) [place] [address] Phone: [phone] Fax: [phone] Hours: Not open 24 hours  Please contact further if needed

## 2021-10-07 NOTE — Telephone Encounter (Signed)
Copied from [place] [phone]. Topic: Quick Communication - Home Health Verbal Orders >> Oct 06, 2021  4:07 PM Everette C wrote: Caller/Agency: Alona Bene Number: [phone] Requesting OT/PT/Skilled Nursing/Social Work/Speech Therapy: Nursing  Frequency: 2w2, 1w4 for assessment of healing to left AKA and DB and HTN education/management

## 2021-10-12 ENCOUNTER — Telehealth: Payer: Self-pay | Admitting: Family

## 2021-10-12 NOTE — Telephone Encounter (Signed)
Home Health Verbal Orders - Caller/Agency:  [name]/ [facility] Callback Number: [phone]/ vm can be left  Requesting OT Frequency: 1x a week for 1 week  2xs a week for two weeks and  1x a week for 3 weeks   She asked if she can be given a call for these order today

## 2021-10-12 NOTE — Telephone Encounter (Signed)
Spoke w/[name] w/[facility], verbal orders given for OT

## 2021-10-13 ENCOUNTER — Telehealth: Payer: Self-pay | Admitting: Family

## 2021-10-13 NOTE — Telephone Encounter (Signed)
Home Health Verbal Orders - Caller/Agency: Dashiria With [facility] Number: [phone]  Requesting OT/PT/Skilled Nursing/Social Work/Speech Therapy: OT orders  Frequency: 1 week 4 and to include ADL, IDAL, adaptive equipment , functional transfer to shower and toilet, Bedside commode and shower chair

## 2021-10-13 NOTE — Telephone Encounter (Signed)
Orders were given to Grace w/[facility] on 07/17

## 2021-10-14 NOTE — Telephone Encounter (Signed)
[name] called back clarifying that her OT orders are different from [name]'s from 10/12/2021 please advise and call back. She also has questions regarding how the office handles DME supply requests.   Best contact: [phone]

## 2021-10-14 NOTE — Telephone Encounter (Signed)
Spoke w/[name] verbal orders given , pt is requesting shower chair and bedside commode will forward to PCP for order to be placed advised will forward to [facility] DME once completed

## 2021-10-14 NOTE — Telephone Encounter (Signed)
Copied from [place] [phone]. Topic: General - Other >> Oct 14, 2021  3:01 PM [place] B wrote: Reason for CRM: Annisa from [facility] called in says a free style Elenor Legato was ordered for patient but has a bad sensor and she is asking if Dr Minette Brine can order another one for him. Please call back  Pt would need to notify pharmacy because pt has refills on file, sensors just sent in on 10/07/21

## 2021-10-21 ENCOUNTER — Encounter: Payer: Medicare Other | Admitting: Family

## 2021-10-22 ENCOUNTER — Telehealth: Payer: Self-pay | Admitting: *Deleted

## 2021-10-22 ENCOUNTER — Other Ambulatory Visit: Payer: Self-pay | Admitting: Family

## 2021-10-22 DIAGNOSIS — Z794 Long term (current) use of insulin: Secondary | ICD-10-CM

## 2021-10-22 MED ORDER — FREESTYLE LIBRE 2 SENSOR MISC: 6 refills | Status: AC

## 2021-10-22 NOTE — Telephone Encounter (Signed)
-   Confirm patient still established with Cardiology. Upcoming appointment 11/09/2021 with Larae Grooms, [facility] - sensor order complete.

## 2021-10-22 NOTE — Telephone Encounter (Signed)
BP 160/90 P 75 asymptomatic Compliant with medication, patient does smoke, does not have cuff to check at home- but does have caregivers in and out- PT,nurse that will report abnormal readings.   Request Freestyle Libre- sensor was bad in original order- advised  Rx was sent 10/13/21- have patient check to see if they will fill. (Advised sometimes have to return defective device for replacement-will check that as well)

## 2021-10-28 ENCOUNTER — Encounter: Payer: Self-pay | Admitting: Family

## 2021-10-28 ENCOUNTER — Ambulatory Visit (INDEPENDENT_AMBULATORY_CARE_PROVIDER_SITE_OTHER): Payer: Medicare Other | Admitting: Family

## 2021-10-28 DIAGNOSIS — Z89612 Acquired absence of left leg above knee: Secondary | ICD-10-CM | POA: Diagnosis not present

## 2021-10-28 DIAGNOSIS — L97511 Non-pressure chronic ulcer of other part of right foot limited to breakdown of skin: Secondary | ICD-10-CM
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
## 2021-10-30 ENCOUNTER — Telehealth: Payer: Self-pay | Admitting: Family

## 2021-10-30 NOTE — Telephone Encounter (Signed)
Spoke w/Lashari  w/[facility] OT verbal orders approved

## 2021-10-30 NOTE — Telephone Encounter (Signed)
Home Health Verbal Orders - Caller/Agency: [facility] Number: [phone] Requesting OT Frequency: 1 WK 2 wks to include ADL, IADL, Therapeutic exercise and activities and functional transfer to shower and toilet
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
## 2021-11-09 ENCOUNTER — Ambulatory Visit: Payer: Medicare Other | Admitting: Interventional Cardiology

## 2021-11-11 NOTE — Telephone Encounter (Signed)
Contacted Steve, lvm w/verbal orders

## 2021-11-23 ENCOUNTER — Telehealth: Payer: Self-pay | Admitting: Orthopedic Surgery

## 2021-11-23 NOTE — Telephone Encounter (Signed)
Message sent to [employer] to question what it is the pt needs to have his prosthetic approved. Will hold this message pending advisement.

## 2021-11-23 NOTE — Telephone Encounter (Signed)
Patient called advised his insurance company advised him  that they are waiting for the provider to contact them [facility]) Patient said he is waiting to get his prosthetic leg. Patient said his appointment was canceled by [facility]. The number to contact patient is [phone]

## 2021-11-24 ENCOUNTER — Telehealth: Payer: Self-pay | Admitting: Orthopedic Surgery

## 2021-11-24 ENCOUNTER — Ambulatory Visit: Payer: Medicare Other | Admitting: Internal Medicine

## 2021-11-24 NOTE — Telephone Encounter (Signed)
[employer] emailed and confirmed that this is something that they do and that the insurance dept is working on this currently and there is nothing for [place] to do.

## 2021-11-24 NOTE — Telephone Encounter (Signed)
Almyra Free (Clinical Navigator) for pt called requesting a call back. She states pt need a per auth for prostatic and pt has an appt tomorrow. Please call julie back as soon as possible so pt dont have to reschedule his appt. Almyra Free number is [phone] ext [phone]
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
## 2021-12-01 ENCOUNTER — Ambulatory Visit: Payer: Medicare Other | Admitting: Internal Medicine
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
## 2021-12-02 ENCOUNTER — Encounter: Payer: Self-pay | Admitting: Internal Medicine

## 2021-12-14 ENCOUNTER — Other Ambulatory Visit: Payer: Self-pay | Admitting: Orthopedic Surgery

## 2021-12-14 ENCOUNTER — Telehealth: Payer: Self-pay | Admitting: Orthopedic Surgery

## 2021-12-14 DIAGNOSIS — Z89612 Acquired absence of left leg above knee: Secondary | ICD-10-CM

## 2021-12-14 NOTE — Telephone Encounter (Signed)
Patient called asked if Dr. Sharol Given will write an order for [facility] to come out to his home and help him learn how to walk on his prosthetic left leg?  The number to contact patient is [phone]

## 2021-12-14 NOTE — Telephone Encounter (Signed)
Tried calling pt to notify, no answer and no VM

## 2021-12-14 NOTE — Telephone Encounter (Signed)
Faxed referral to [facility]

## 2021-12-15 ENCOUNTER — Telehealth: Payer: Self-pay | Admitting: Orthopedic Surgery

## 2021-12-15 ENCOUNTER — Other Ambulatory Visit: Payer: Self-pay | Admitting: Family Medicine

## 2021-12-15 ENCOUNTER — Other Ambulatory Visit: Payer: Self-pay | Admitting: Orthopedic Surgery

## 2021-12-15 DIAGNOSIS — I10 Essential (primary) hypertension: Secondary | ICD-10-CM

## 2021-12-15 DIAGNOSIS — Z89612 Acquired absence of left leg above knee: Secondary | ICD-10-CM

## 2021-12-15 NOTE — Telephone Encounter (Signed)
Patient called asking how he can be set up with [facility] in PT upstairs. CB # [phone]

## 2021-12-15 NOTE — Telephone Encounter (Signed)
Pt called yesterday wanting a HH referral to [facility]. Will put in another referral for outpatient PT.

## 2021-12-15 NOTE — Telephone Encounter (Signed)
Pt informed referral sent and our office will call for an apt.

## 2021-12-16 NOTE — Telephone Encounter (Signed)
Seems Clonidine ordered on 12/15/2021 by Dr. Roma Schanz. Also, patient established with Cardiology. Please notify cardiology attending provider as well.

## 2021-12-17 ENCOUNTER — Other Ambulatory Visit: Payer: Self-pay | Admitting: Family

## 2021-12-17 NOTE — Telephone Encounter (Signed)
Dr. Margarita Rana, the last appointment I had with the patient was 04/01/2021. The last note that addressed patient's blood pressure medication was on 09/15/2021 by Dr. Terrilee Croak during hospital discharge summary. It is indicated that the patient stated Clonidine makes him dizzy and was discontinued. The note went on to state that his blood pressure was controlled on Amlodipine, Coreg, and Hydralazine. Is it safe for me to refill? Please advise.   Thank you,   Skyylar Kopf

## 2021-12-18 ENCOUNTER — Telehealth: Payer: Self-pay | Admitting: Family

## 2021-12-18 NOTE — Telephone Encounter (Signed)
Spoke to pt states that he advised pharmacy that he did not need refill on Clonidine, cause he does not take it because of the side effects

## 2021-12-22 ENCOUNTER — Other Ambulatory Visit: Payer: Self-pay | Admitting: *Deleted

## 2021-12-22 DIAGNOSIS — I10 Essential (primary) hypertension: Secondary | ICD-10-CM

## 2021-12-22 DIAGNOSIS — E785 Hyperlipidemia, unspecified: Secondary | ICD-10-CM

## 2021-12-22 NOTE — Telephone Encounter (Signed)
OK to refill.  Patient has appointment scheduled for 01/04/22.  Must keep this appointment for further refills

## 2021-12-23 ENCOUNTER — Other Ambulatory Visit: Payer: Self-pay | Admitting: Family

## 2021-12-23 DIAGNOSIS — E1142 Type 2 diabetes mellitus with diabetic polyneuropathy: Secondary | ICD-10-CM

## 2021-12-24 NOTE — Telephone Encounter (Signed)
dc'd 04/02/21 reorder Amy StephensNP  Requested Prescriptions  Refused Prescriptions Disp Refills  . LANTUS SOLOSTAR 100 UNIT/ML Solostar Pen [Pharmacy Med Name: Lantus SoloStar 100 UNIT/ML Subcutaneous Solution Pen-injector] 30 mL 2    Sig: INJECT SUBCUTANEOUSLY Enoch DAILY     Endocrinology:  Diabetes - Insulins Failed - 12/23/2021 11:46 PM      Failed - Valid encounter within last 6 months    Recent Outpatient Visits          8 months ago Type 2 diabetes mellitus with diabetic polyneuropathy, with long-term current use of insulin [facility])   Primary Care at [facility], Amy J, NP   11 months ago Type 2 diabetes mellitus with diabetic polyneuropathy, with long-term current use of insulin [facility])   Primary Care at [facility], [place], NP   1 year ago Encounter to establish care   Primary Care at [facility], [place], NP   1 year ago Type 2 diabetes mellitus with diabetic polyneuropathy, with long-term current use of insulin [facility])   [place], Jarome [place], RPH-CPP   1 year ago Type 2 diabetes mellitus with diabetic polyneuropathy, with long-term current use of insulin [facility])   [place], RPH-CPP      Future Appointments            In 1 week [place], Charlann Lange, MD [facility], [facility]           Passed - HBA1C is between 0 and 7.9 and within 180 days    HbA1c, POC (controlled diabetic range)  Date Value Ref Range Status  01/07/2021 7.3 (A) 0.0 - 7.0 % Final   Hgb A1c MFr Bld  Date Value Ref Range Status  08/30/2021 7.5 (H) 4.8 - 5.6 % Final    Comment:    (NOTE) Pre diabetes:          5.7%-6.4%  Diabetes:              >6.4%  Glycemic control for   <7.0% adults with diabetes

## 2021-12-29 ENCOUNTER — Telehealth: Payer: Self-pay | Admitting: Interventional Cardiology

## 2021-12-29 DIAGNOSIS — I10 Essential (primary) hypertension: Secondary | ICD-10-CM

## 2021-12-29 MED ORDER — AMLODIPINE BESYLATE 10 MG PO TABS: 10.0000 mg | ORAL_TABLET | Freq: Every day | ORAL | 0 refills | Status: DC

## 2021-12-29 NOTE — Telephone Encounter (Signed)
*  STAT* If patient is at the pharmacy, call can be transferred to refill team.   1. Which medications need to be refilled? (please list name of each medication and dose if known) amLODipine (NORVASC) 10 MG tablet  atorvastatin (LIPITOR) 20 MG tablet  2. Which pharmacy/location (including street and city if local pharmacy) is medication to be sent to? [place], [place]  3. Do they need a 30 day or 90 day supply? [place]

## 2021-12-29 NOTE — Telephone Encounter (Signed)
Pt scheduled to see Dr. Marlou Porch, 01/04/22, sent in a refill for Amlodipine, 30 days, to [place].  Looks like pt has enough of Atorvastatin for 1 year.

## 2021-12-30 ENCOUNTER — Ambulatory Visit: Payer: No Typology Code available for payment source | Admitting: Rehabilitation

## 2022-01-04 ENCOUNTER — Ambulatory Visit: Payer: Medicare Other | Admitting: Interventional Cardiology

## 2022-01-05 ENCOUNTER — Other Ambulatory Visit: Payer: Self-pay | Admitting: Cardiology

## 2022-01-05 DIAGNOSIS — I10 Essential (primary) hypertension: Secondary | ICD-10-CM
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
## 2022-01-08 ENCOUNTER — Encounter: Payer: Self-pay | Admitting: Interventional Cardiology

## 2022-01-08 ENCOUNTER — Ambulatory Visit: Payer: Medicare Other | Attending: Interventional Cardiology | Admitting: Interventional Cardiology

## 2022-01-08 VITALS — BP 130/72 | HR 93 | Ht 65.0 in | Wt 187.0 lb

## 2022-01-08 DIAGNOSIS — L97514 Non-pressure chronic ulcer of other part of right foot with necrosis of bone: Secondary | ICD-10-CM

## 2022-01-08 DIAGNOSIS — I10 Essential (primary) hypertension: Secondary | ICD-10-CM

## 2022-01-08 DIAGNOSIS — I25118 Atherosclerotic heart disease of native coronary artery with other forms of angina pectoris: Secondary | ICD-10-CM

## 2022-01-08 DIAGNOSIS — E1151 Type 2 diabetes mellitus with diabetic peripheral angiopathy without gangrene: Secondary | ICD-10-CM | POA: Diagnosis not present

## 2022-01-08 DIAGNOSIS — M533 Sacrococcygeal disorders, not elsewhere classified: Secondary | ICD-10-CM

## 2022-01-08 DIAGNOSIS — Z794 Long term (current) use of insulin: Secondary | ICD-10-CM

## 2022-01-08 DIAGNOSIS — Z720 Tobacco use: Secondary | ICD-10-CM

## 2022-01-08 DIAGNOSIS — M79644 Pain in right finger(s): Secondary | ICD-10-CM

## 2022-01-08 DIAGNOSIS — N1832 Chronic kidney disease, stage 3b: Secondary | ICD-10-CM

## 2022-01-08 MED ORDER — NICOTINE 14 MG/24HR TD PT24: 14.0000 mg | MEDICATED_PATCH | Freq: Every day | TRANSDERMAL | 2 refills | Status: AC

## 2022-01-08 NOTE — Patient Instructions (Addendum)
Medication Instructions:  Your physician has recommended you make the following change in your medication: Start 14 mg nicotine patch daily  *If you need a refill on your cardiac medications before your next appointment, please call your pharmacy*   Lab Work: Lab work to be done today--BMP and CBC If you have labs (blood work) drawn today and your tests are completely normal, you will receive your results only by: Linda (if you have MyChart) OR A paper copy in the mail If you have any lab test that is abnormal or we need to change your treatment, we will call you to review the results.   Testing/Procedures: Have X rays of finger and sacral area done at [place]: At [facility], you and your health needs are our priority.  As part of our continuing mission to provide you with exceptional heart care, we have created designated Provider Care Teams.  These Care Teams include your primary Cardiologist (physician) and Advanced Practice Providers (APPs -  Physician Assistants and Nurse Practitioners) who all work together to provide you with the care you need, when you need it.  We recommend signing up for the patient portal called "MyChart".  Sign up information is provided on this After Visit Summary.  MyChart is used to connect with patients for Virtual Visits (Telemedicine).  Patients are able to view lab/test results, encounter notes, upcoming appointments, etc.  Non-urgent messages can be sent to your provider as well.   To learn more about what you can do with MyChart, go to [email or web address].    Your next appointment:   12 month(s)  The format for your next appointment:   In Person  Provider:   Larae Grooms, MD     Other Instructions    Important Information About Sugar

## 2022-01-09 LAB — CBC
Hematocrit: 37.5 % (ref 37.5–51.0)
Hemoglobin: 12.3 g/dL — ABNORMAL LOW (ref 13.0–17.7)
MCH: 28.5 pg (ref 26.6–33.0)
MCHC: 32.8 g/dL (ref 31.5–35.7)
MCV: 87 fL (ref 79–97)
Platelets: 371 10*3/uL (ref 150–450)
RBC: 4.32 x10E6/uL (ref 4.14–5.80)
RDW: 12.8 % (ref 11.6–15.4)
WBC: 13.5 10*3/uL — ABNORMAL HIGH (ref 3.4–10.8)

## 2022-01-09 LAB — BASIC METABOLIC PANEL
BUN/Creatinine Ratio: 9 (ref 9–20)
BUN: 27 mg/dL — ABNORMAL HIGH (ref 6–24)
CO2: 23 mmol/L (ref 20–29)
Calcium: 9.4 mg/dL (ref 8.7–10.2)
Chloride: 104 mmol/L (ref 96–106)
Creatinine, Ser: 2.93 mg/dL — ABNORMAL HIGH (ref 0.76–1.27)
Glucose: 250 mg/dL — ABNORMAL HIGH (ref 70–99)
Potassium: 4.5 mmol/L (ref 3.5–5.2)
Sodium: 140 mmol/L (ref 134–144)
eGFR: 25 mL/min/{1.73_m2} — ABNORMAL LOW (ref >59)

## 2022-01-11 ENCOUNTER — Ambulatory Visit
Admission: RE | Admit: 2022-01-11 | Discharge: 2022-01-11 | Disposition: A | Discharge status: Home / Self Care | Payer: Medicare Other | Source: Ambulatory Visit | Attending: Interventional Cardiology | Admitting: Interventional Cardiology

## 2022-01-11 DIAGNOSIS — M533 Sacrococcygeal disorders, not elsewhere classified: Secondary | ICD-10-CM

## 2022-01-11 DIAGNOSIS — M79644 Pain in right finger(s): Secondary | ICD-10-CM

## 2022-01-12 ENCOUNTER — Other Ambulatory Visit: Payer: Self-pay | Admitting: Family

## 2022-01-12 DIAGNOSIS — S62602A Fracture of unspecified phalanx of right middle finger, initial encounter for closed fracture: Secondary | ICD-10-CM

## 2022-01-12 DIAGNOSIS — S322XXA Fracture of coccyx, initial encounter for closed fracture: Secondary | ICD-10-CM

## 2022-01-13 ENCOUNTER — Encounter: Payer: Self-pay | Admitting: Rehabilitation

## 2022-01-13 ENCOUNTER — Other Ambulatory Visit: Payer: Self-pay

## 2022-01-13 ENCOUNTER — Ambulatory Visit: Payer: Medicare Other | Attending: Orthopedic Surgery | Admitting: Rehabilitation

## 2022-01-13 DIAGNOSIS — Z89612 Acquired absence of left leg above knee: Secondary | ICD-10-CM | POA: Diagnosis not present

## 2022-01-13 DIAGNOSIS — R293 Abnormal posture: Secondary | ICD-10-CM | POA: Diagnosis present

## 2022-01-13 DIAGNOSIS — R2689 Other abnormalities of gait and mobility: Secondary | ICD-10-CM | POA: Diagnosis present

## 2022-01-13 DIAGNOSIS — R2681 Unsteadiness on feet: Secondary | ICD-10-CM | POA: Diagnosis not present

## 2022-01-13 DIAGNOSIS — M6281 Muscle weakness (generalized): Secondary | ICD-10-CM | POA: Insufficient documentation
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
## 2022-01-18 ENCOUNTER — Ambulatory Visit: Payer: Medicare Other | Admitting: Physical Therapy

## 2022-01-19 ENCOUNTER — Ambulatory Visit: Payer: Medicare Other | Admitting: Family

## 2022-01-20 ENCOUNTER — Ambulatory Visit: Payer: Medicare Other | Admitting: Internal Medicine

## 2022-01-21 ENCOUNTER — Ambulatory Visit: Payer: Medicare Other

## 2022-01-25 ENCOUNTER — Ambulatory Visit: Payer: Medicare Other | Admitting: Physical Therapy

## 2022-01-27 ENCOUNTER — Telehealth: Payer: Self-pay

## 2022-01-27 ENCOUNTER — Ambulatory Visit: Payer: No Typology Code available for payment source

## 2022-01-27 NOTE — Telephone Encounter (Signed)
Patient no-showed to appt on 11/1 at 1:15pm. PT called patient and LVM reminding patient of 3 no-show policy and next appt time.

## 2022-01-27 NOTE — Therapy (Unsigned)
[facility] [address] [place], [place], [address] Phone: [phone]   Fax:  [phone]  Patient Details  Name: [name] MRN: [id number] Date of Birth: 01/30/1971 Referring Provider:  No ref. provider found  Encounter Date: 01/27/2022  Patient requesting to be put on hold for now due to difficulties with reliable transportation. He will resume PT once this is sorted out.    Debbora Dus, PT Debbora Dus, PT, DPT, CBIS  01/27/2022, 1:46 PM  [facility] [address] [place], [place], [address] Phone: [phone]   Fax:  [phone]

## 2022-02-01 ENCOUNTER — Ambulatory Visit: Payer: No Typology Code available for payment source | Admitting: Physical Therapy

## 2022-02-03 ENCOUNTER — Ambulatory Visit: Payer: Medicare Other | Admitting: Rehabilitation

## 2022-02-04 ENCOUNTER — Other Ambulatory Visit: Payer: Self-pay | Admitting: Family

## 2022-02-04 DIAGNOSIS — E1142 Type 2 diabetes mellitus with diabetic polyneuropathy: Secondary | ICD-10-CM

## 2022-02-05 NOTE — Telephone Encounter (Signed)
Unable to refill per protocol, Rx expired. Medication was discontinued 04/02/21. Will refuse.  Requested Prescriptions  Pending Prescriptions Disp Refills   LANTUS SOLOSTAR 100 UNIT/ML Solostar Pen [Pharmacy Med Name: Lantus SoloStar 100 UNIT/ML Subcutaneous Solution Pen-injector] 15 mL 6    Sig: INJECT SUBCUTANEOUSLY [place]     Endocrinology:  Diabetes - Insulins Failed - 02/04/2022 10:28 PM      Failed - Valid encounter within last 6 months    Recent Outpatient Visits           10 months ago Type 2 diabetes mellitus with diabetic polyneuropathy, with long-term current use of insulin [facility])   Primary Care at [facility], Amy J, NP   1 year ago Type 2 diabetes mellitus with diabetic polyneuropathy, with long-term current use of insulin [facility])   Primary Care at [facility], [place], NP   1 year ago Encounter to establish care   Primary Care at [facility], Amy J, NP   1 year ago Type 2 diabetes mellitus with diabetic polyneuropathy, with long-term current use of insulin ([place])   [place], Jarome Matin, RPH-CPP   1 year ago Type 2 diabetes mellitus with diabetic polyneuropathy, with long-term current use of insulin ([place])   [place], Annie Main L, RPH-CPP              Passed - HBA1C is between 0 and 7.9 and within 180 days    HbA1c, POC (controlled diabetic range)  Date Value Ref Range Status  01/07/2021 7.3 (A) 0.0 - 7.0 % Final   Hgb A1c MFr Bld  Date Value Ref Range Status  08/30/2021 7.5 (H) 4.8 - 5.6 % Final    Comment:    (NOTE) Pre diabetes:          5.7%-6.4%  Diabetes:              >6.4%  Glycemic control for   <7.0% adults with diabetes

## 2022-02-08 ENCOUNTER — Ambulatory Visit: Payer: Medicare Other | Admitting: Physical Therapy

## 2022-02-10 ENCOUNTER — Ambulatory Visit: Payer: Medicare Other

## 2022-02-11 ENCOUNTER — Other Ambulatory Visit: Payer: Self-pay

## 2022-02-11 DIAGNOSIS — I10 Essential (primary) hypertension: Secondary | ICD-10-CM

## 2022-02-11 MED ORDER — CLONIDINE HCL 0.1 MG PO TABS: 0.1000 mg | ORAL_TABLET | Freq: Two times a day (BID) | ORAL | 3 refills | Status: AC

## 2022-02-11 MED ORDER — LOSARTAN POTASSIUM 25 MG PO TABS: 25.0000 mg | ORAL_TABLET | Freq: Every day | ORAL | 3 refills | Status: AC

## 2022-02-11 MED ORDER — AMLODIPINE BESYLATE 10 MG PO TABS: 10.0000 mg | ORAL_TABLET | Freq: Every day | ORAL | 3 refills | Status: AC

## 2022-02-11 NOTE — Telephone Encounter (Signed)
Pt's medications were sent to pt's pharmacy as requested. Confirmation received.  

## 2022-02-15 ENCOUNTER — Ambulatory Visit: Payer: Medicare Other | Admitting: Physical Therapy

## 2022-02-17 ENCOUNTER — Ambulatory Visit: Payer: Medicare Other

## 2022-02-22 ENCOUNTER — Ambulatory Visit: Payer: Medicare Other | Admitting: Physical Therapy

## 2022-02-24 ENCOUNTER — Ambulatory Visit: Payer: Medicare Other | Admitting: Rehabilitation

## 2022-02-24 ENCOUNTER — Other Ambulatory Visit: Payer: Self-pay | Admitting: Family Medicine

## 2022-03-23 ENCOUNTER — Telehealth (HOSPITAL_COMMUNITY): Payer: Self-pay | Admitting: Licensed Clinical Social Worker

## 2022-03-23 NOTE — Telephone Encounter (Signed)
Halil has not been engaged in therapy since 11/24/20.  Clinician Leonie Douglas by phone today to inquire about whether he intends to make a followup appointment soon, and offer assistance with scheduling and referral resources if needed.  Tyrail did answer this phone call, and reported that he would be interested in restarting therapy soon. Clinician provided Beverely Low with our office number to schedule an appointment, but informed him that he would be discharged if an appointment is not made by February 1st, 2024.  Daegon reported that he understood, and would outreach our office this week for an appointment.    Shade Flood, [place], LCAS 03/23/22

## 2022-04-05 IMAGING — US US RENAL
1 series · 14 of 25 positions shown · non-contrast
Comparison: CT abdomen pelvis 07/10/2013

CLINICAL DATA: Stage III CKD

EXAM:
RENAL / URINARY TRACT ULTRASOUND COMPLETE

[Series 1: us renal · 0.23mm/px · 14 of 37 slices shown]
[im 1/37]
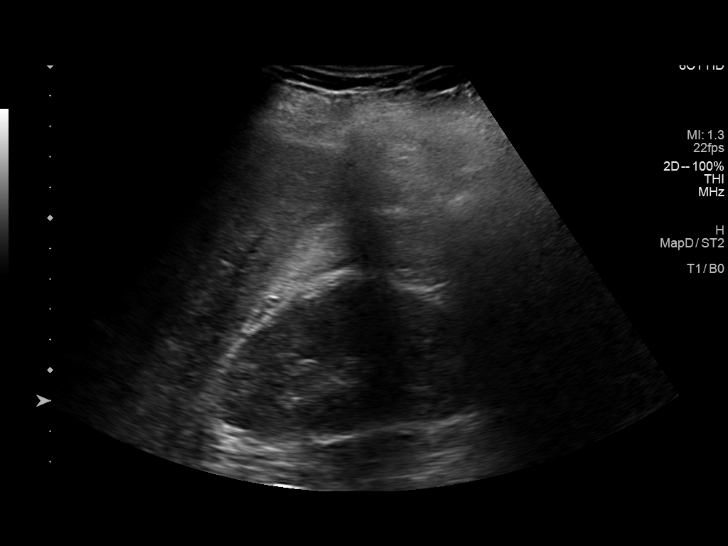
[im 4/37]
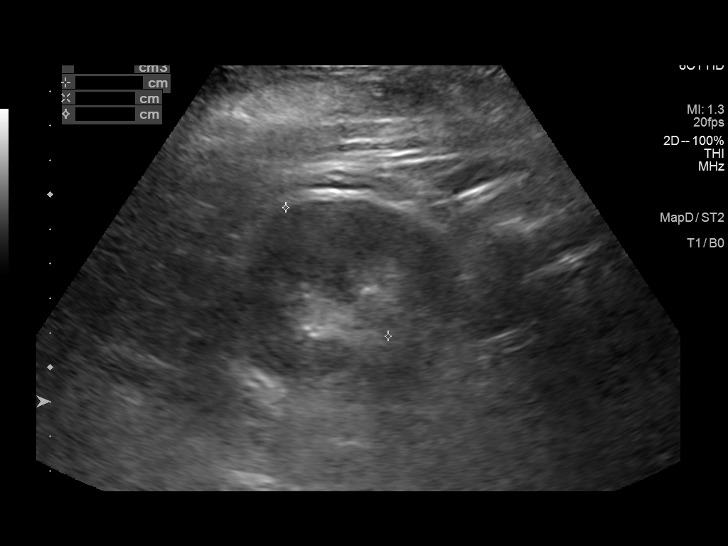
[im 7/37]
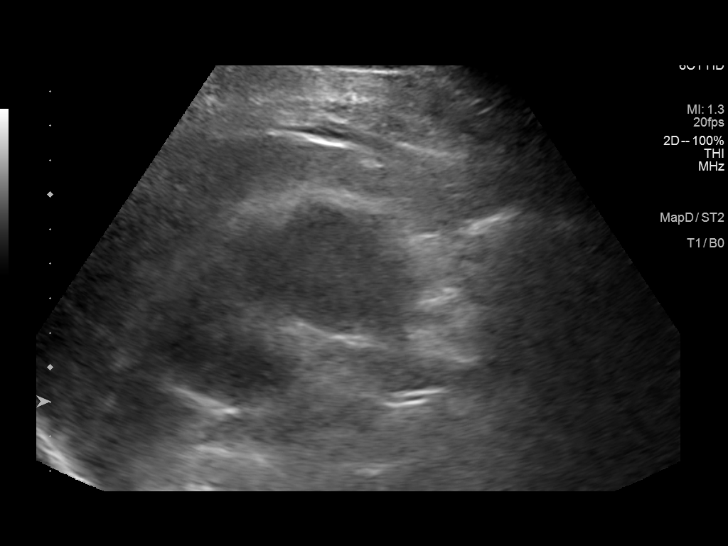
[im 10/37]
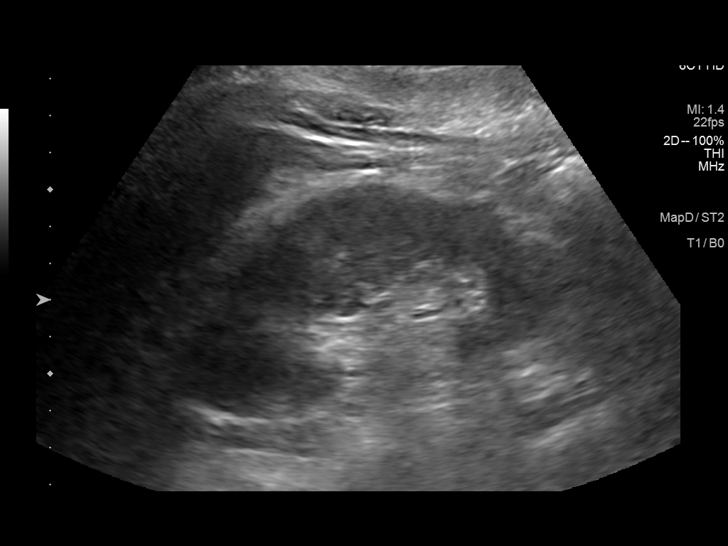
[im 13/37]
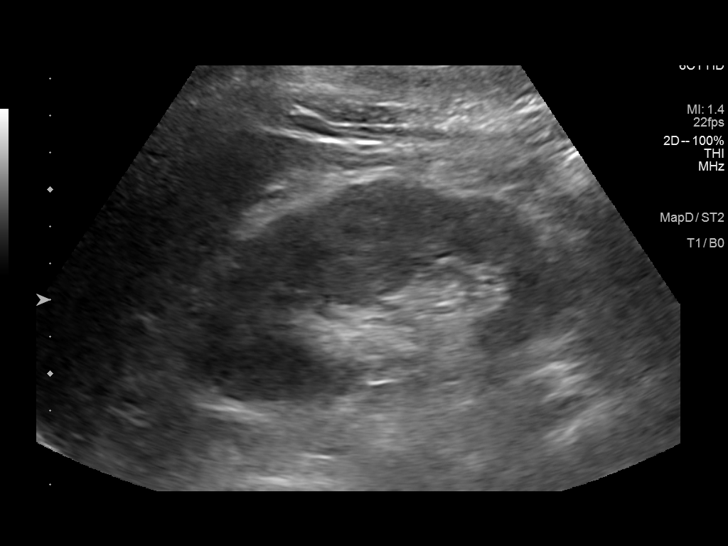
[im 14/37]
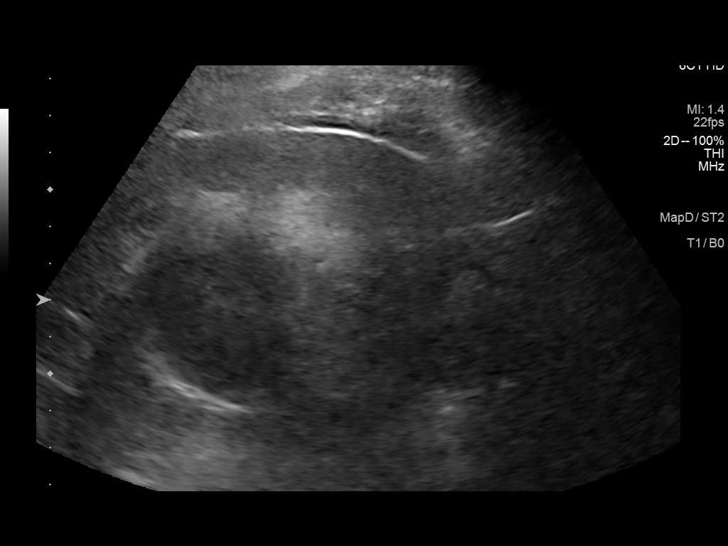
[im 17/37]
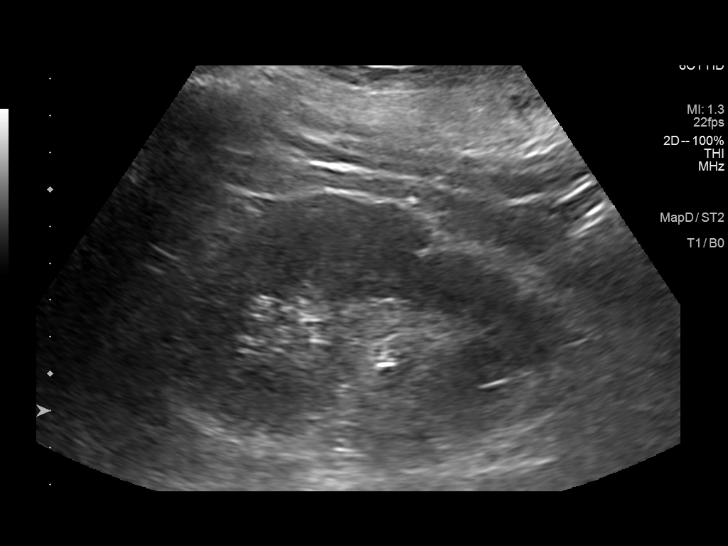
[im 20/37]
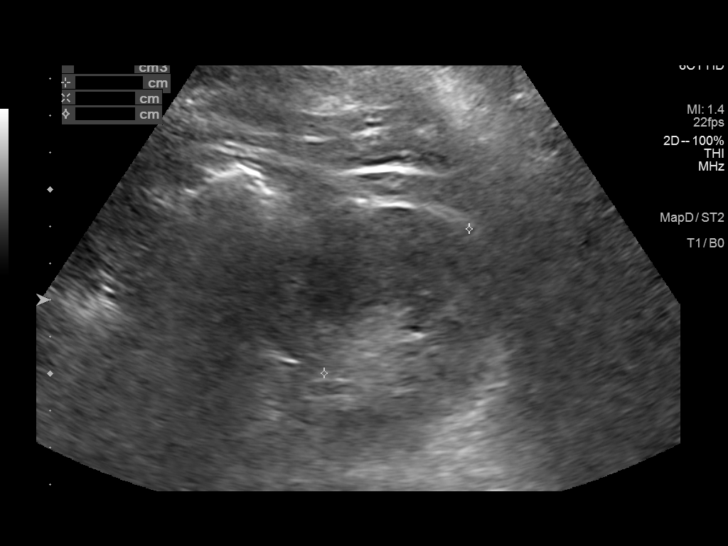
[im 23/37]
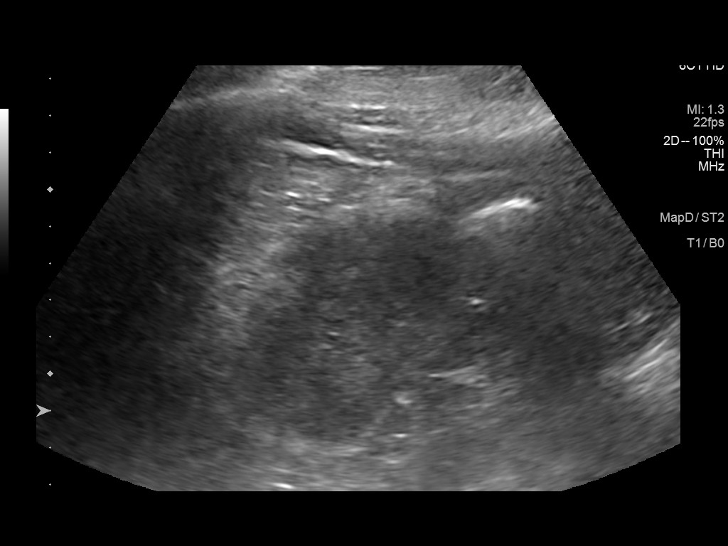
[im 25/37]
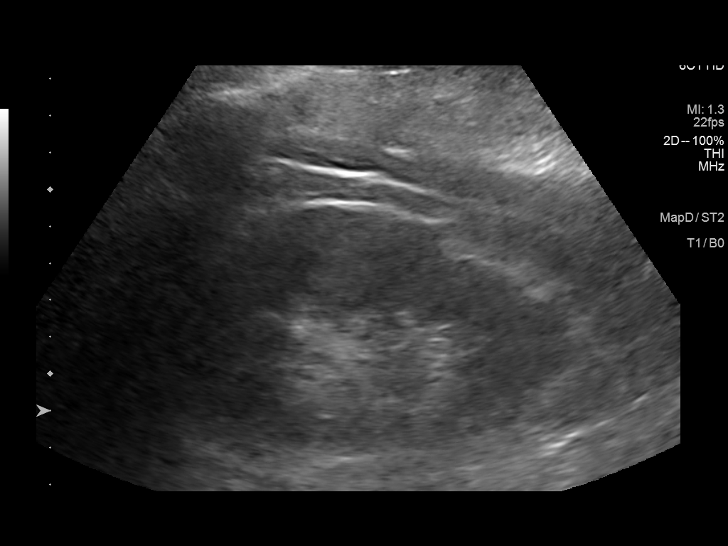
[im 28/37]
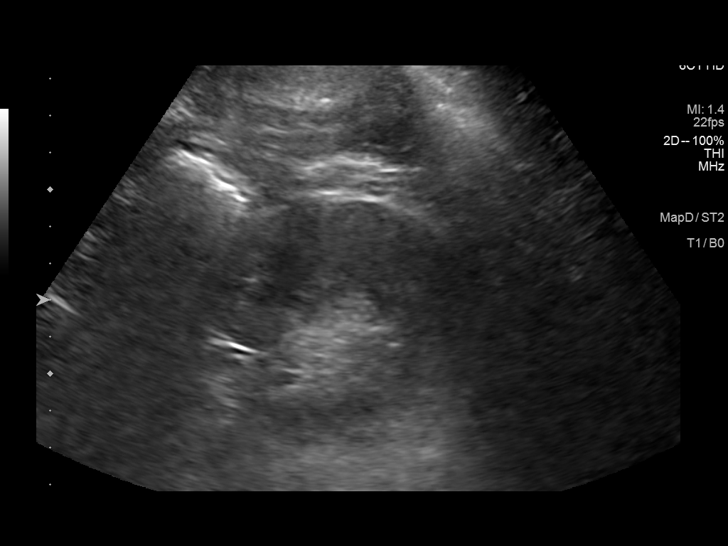
[im 31/37]
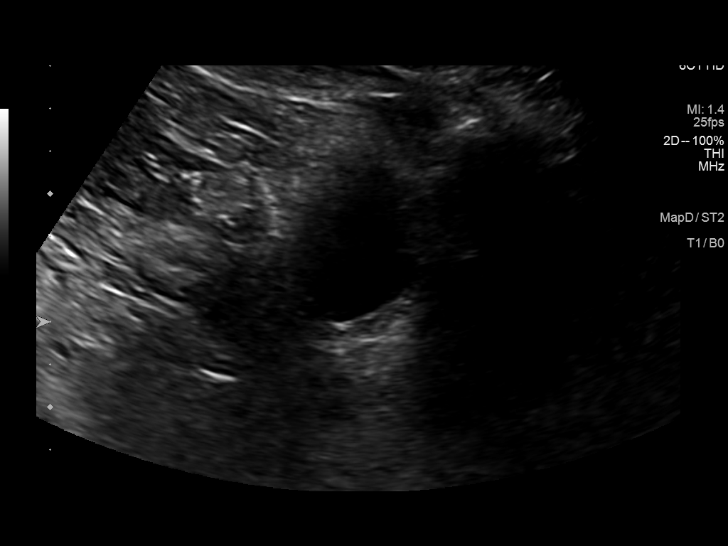
[im 34/37]
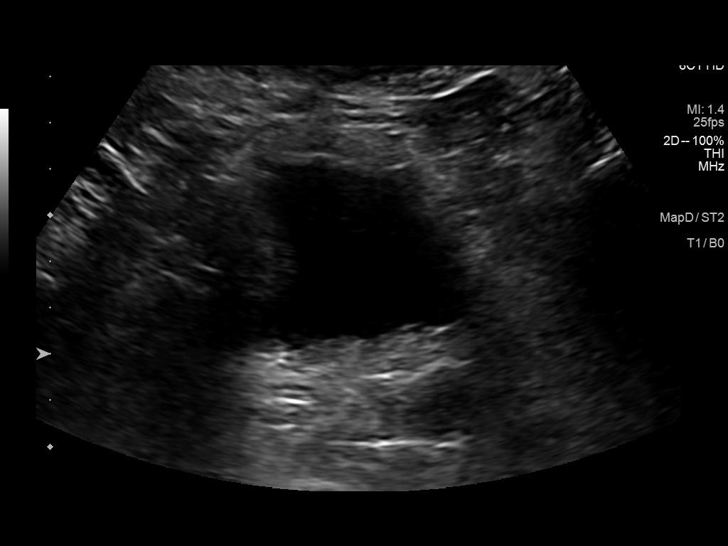
[im 37/37]
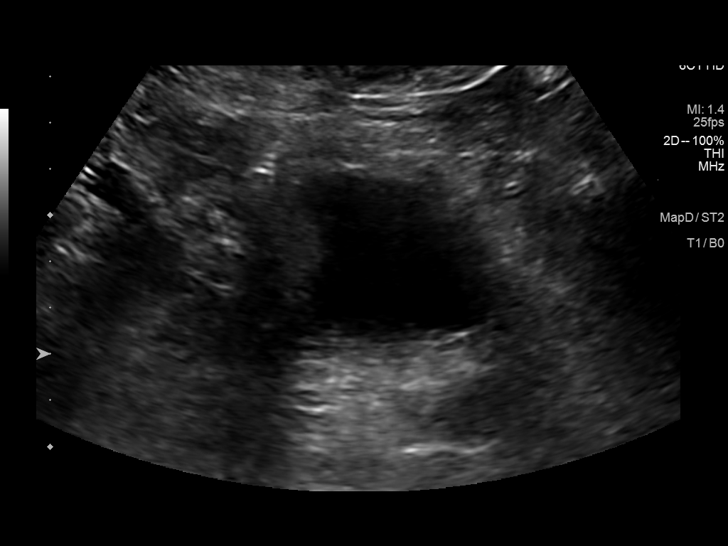

[14 of 25 positions shown; findings below may reference images not displayed]

FINDINGS: Right Kidney:

Renal measurements: 10.3 x 4.6 x 4.8 cm = volume: 118 mL.
Echogenicity within normal limits. No mass or hydronephrosis
visualized.

Left Kidney:

Renal measurements: 10.8 x 6.3 x 5.6 cm = volume: 198 mL.
Echogenicity within normal limits. No mass or hydronephrosis
visualized.

Bladder:

Appears normal for degree of bladder distention.

Other:

None.
IMPRESSION: Unremarkable sonographic appearance of the bilateral kidneys.

## 2022-04-16 ENCOUNTER — Ambulatory Visit: Payer: Medicare Other | Admitting: Internal Medicine

## 2022-10-08 ENCOUNTER — Other Ambulatory Visit: Payer: Self-pay

## 2022-10-08 ENCOUNTER — Encounter (HOSPITAL_COMMUNITY): Payer: Self-pay

## 2022-10-08 ENCOUNTER — Emergency Department (HOSPITAL_COMMUNITY)
Admission: EM | Admit: 2022-10-08 | Discharge: 2022-10-08 | Disposition: A | Discharge status: Home / Self Care | Payer: Medicare HMO | Attending: Emergency Medicine | Admitting: Emergency Medicine

## 2022-10-08 DIAGNOSIS — E1122 Type 2 diabetes mellitus with diabetic chronic kidney disease: Secondary | ICD-10-CM | POA: Insufficient documentation

## 2022-10-08 DIAGNOSIS — E1165 Type 2 diabetes mellitus with hyperglycemia: Secondary | ICD-10-CM | POA: Insufficient documentation

## 2022-10-08 DIAGNOSIS — D72829 Elevated white blood cell count, unspecified: Secondary | ICD-10-CM | POA: Diagnosis not present

## 2022-10-08 DIAGNOSIS — Z89512 Acquired absence of left leg below knee: Secondary | ICD-10-CM | POA: Diagnosis not present

## 2022-10-08 DIAGNOSIS — Z7982 Long term (current) use of aspirin: Secondary | ICD-10-CM | POA: Insufficient documentation

## 2022-10-08 DIAGNOSIS — Z794 Long term (current) use of insulin: Secondary | ICD-10-CM | POA: Diagnosis not present

## 2022-10-08 DIAGNOSIS — N183 Chronic kidney disease, stage 3 unspecified: Secondary | ICD-10-CM | POA: Insufficient documentation

## 2022-10-08 DIAGNOSIS — E875 Hyperkalemia: Secondary | ICD-10-CM | POA: Insufficient documentation

## 2022-10-08 LAB — COMPREHENSIVE METABOLIC PANEL
ALT: 11 U/L (ref 0–44)
AST: 10 U/L — ABNORMAL LOW (ref 15–41)
Albumin: 3.4 g/dL — ABNORMAL LOW (ref 3.5–5.0)
Alkaline Phosphatase: 95 U/L (ref 38–126)
Anion gap: 8 (ref 5–15)
BUN: 54 mg/dL — ABNORMAL HIGH (ref 6–20)
CO2: 15 mmol/L — ABNORMAL LOW (ref 22–32)
Calcium: 8.8 mg/dL — ABNORMAL LOW (ref 8.9–10.3)
Chloride: 112 mmol/L — ABNORMAL HIGH (ref 98–111)
Creatinine, Ser: 4.77 mg/dL — ABNORMAL HIGH (ref 0.61–1.24)
GFR, Estimated: 14 mL/min — ABNORMAL LOW (ref >60)
Glucose, Bld: 163 mg/dL — ABNORMAL HIGH (ref 70–99)
Potassium: 5.3 mmol/L — ABNORMAL HIGH (ref 3.5–5.1)
Sodium: 135 mmol/L (ref 135–145)
Total Bilirubin: 0.3 mg/dL (ref 0.3–1.2)
Total Protein: 7.7 g/dL (ref 6.5–8.1)

## 2022-10-08 LAB — CBC
HCT: 39.3 % (ref 39.0–52.0)
Hemoglobin: 12.3 g/dL — ABNORMAL LOW (ref 13.0–17.0)
MCH: 28.3 pg (ref 26.0–34.0)
MCHC: 31.3 g/dL (ref 30.0–36.0)
MCV: 90.6 fL (ref 80.0–100.0)
Platelets: 272 10*3/uL (ref 150–400)
RBC: 4.34 MIL/uL (ref 4.22–5.81)
RDW: 15 % (ref 11.5–15.5)
WBC: 14.1 10*3/uL — ABNORMAL HIGH (ref 4.0–10.5)
nRBC: 0 % (ref 0.0–0.2)

## 2022-10-08 LAB — CBG MONITORING, ED: Glucose-Capillary: 150 mg/dL — ABNORMAL HIGH (ref 70–99)

## 2022-10-08 MED ORDER — SODIUM BICARBONATE 650 MG PO TABS: 650.0000 mg | ORAL_TABLET | Freq: Two times a day (BID) | ORAL | 1 refills | Status: AC

## 2022-10-08 MED ORDER — SODIUM ZIRCONIUM CYCLOSILICATE 10 G PO PACK
10.0000 g | PACK | ORAL | Status: AC
  Administered 2022-10-08: 10 g via ORAL
  Filled 2022-10-08: qty 1

## 2022-10-08 NOTE — ED Provider Notes (Signed)
[place] EMERGENCY DEPARTMENT AT [facility] Provider Note   CSN: [id number] Arrival date & time: 10/08/22  2952     History  Chief Complaint  Patient presents with   hyperkalemia    [name] is a 52 y.o. male with medical history of type 2 diabetes, CKD stage III, left BKA, neuromuscular disorder, retention, hyperlipidemia, glaucoma, diabetic foot ulcer.  The patient presents to the ED for evaluation of hyperkalemia.  The patient reports that he was seen at his PCP office on Monday for routine checkup.  Patient reports having labs drawn at this time.  Patient was called back to PCP this morning due to concern of high potassium.  Patient had labs redrawn and states his potassium was 6.1 and he was advised to come to the ER.  Patient denies chest pain, shortness of breath, nausea, vomiting, abdominal pain, fatigue, muscle pain, palpitations.  He does report that he woke up this morning and felt lightheaded described as floating but denies any other concerns.  States his lightheadedness is very mild.  He denies taking Lasix, HCTZ, denies hemodialysis.  Denies history of hyperkalemia.  HPI     Home Medications Prior to Admission medications   Medication Sig Start Date End Date Taking? Authorizing Provider  sodium bicarbonate 650 MG tablet Take 1 tablet (650 mg total) by mouth 2 (two) times daily. 10/08/22  Yes Al Decant, PA-C  Accu-Chek FastClix Lancets MISC 1 Package by Does not apply route as directed. Use as instructed to test blood sugar 2 times daily E11.65 09/23/20   Claiborne Rigg, NP  ACETAMINOPHEN EXTRA STRENGTH 500 MG tablet TAKE 2 TABLETS EVERY 6 HOURS AS NEEDED FOR PAIN 01/30/21   Georganna Skeans, MD  amLODipine (NORVASC) 10 MG tablet Take 1 tablet (10 mg total) by mouth daily. 02/11/22   Corky Crafts, MD  ASPIRIN ADULT LOW STRENGTH 81 MG EC tablet TAKE 1 TABLET BY MOUTH EVERY MORNING 01/30/21   Georganna Skeans, MD  atorvastatin (LIPITOR) 20 MG  tablet TAKE 1 TABLET BY MOUTH DAILY 07/28/21   Rema Fendt, NP  blood glucose meter kit and supplies KIT Dispense based on patient and insurance preference. Use up to four times daily as directed. (FOR ICD-9 250.00, 250.01). Patient taking differently: Inject 1 each into the skin See admin instructions. Dispense based on patient and insurance preference. Use up to four times daily as directed. (FOR ICD-9 250.00, 250.01). 08/25/16   Alm Bustard, MD  Blood Pressure Monitor DEVI Please provide patient with insurance approved blood pressure monitor. I10.0 Patient taking differently: 1 each by Other route See admin instructions. Please provide patient with insurance approved blood pressure monitor. I10.0 10/05/19   Claiborne Rigg, NP  carvedilol (COREG) 25 MG tablet TAKE 1 TABLET BY MOUTH TWICE  DAILY 07/28/21   Rema Fendt, NP  cloNIDine (CATAPRES) 0.1 MG tablet Take 1 tablet (0.1 mg total) by mouth 2 (two) times daily. 02/11/22   Corky Crafts, MD  Continuous Blood Gluc Receiver (FREESTYLE LIBRE 2 READER) DEVI Monitor blood glucose levels 4-5 times per day. ICD10 E11.42 Z79.4 10/07/21   Rema Fendt, NP  Continuous Blood Gluc Sensor (FREESTYLE LIBRE 2 SENSOR) MISC Monitor blood glucose levels 4-5 times per day. ICD10 E11.42 Z79.4 10/22/21   Rema Fendt, NP  docusate sodium (COLACE) 100 MG capsule Take 1 capsule (100 mg total) by mouth daily. Patient taking differently: Take 100 mg by mouth as needed for mild  constipation or moderate constipation. 09/15/21   Lorin Glass, MD  feeding supplement (ENSURE ENLIVE / ENSURE PLUS) LIQD Take 237 mLs by mouth 3 (three) times daily between meals. 09/14/21   Lorin Glass, MD  ferrous sulfate 325 (65 FE) MG EC tablet TAKE 1 TABLET BY MOUTH EVERY MORNING 01/30/21   Georganna Skeans, MD  gabapentin (NEURONTIN) 300 MG capsule Take 1 capsule (300 mg total) by mouth 3 (three) times daily. Patient needs office visit before refills will be given Patient  taking differently: Take 300 mg by mouth 3 (three) times daily. 08/20/21   Hoy Register, MD  glucose blood test strip Use as instructed to test blood sugar 2 times daily E11.65 Patient taking differently: 1 each by Other route See admin instructions. Use as instructed to test blood sugar 2 times daily E11.65 09/23/20   Claiborne Rigg, NP  Infant Care Products [facility]) OINT Apply topically as needed for rash. 09/19/21   [provider]  Insulin Pen Needle 32G X 4 MM MISC Use as instructed. Inject into the skin three time daily. Patient taking differently: 1 each by Other route See admin instructions. Use as instructed. Inject into the skin three time daily. 03/16/21   Rema Fendt, NP  losartan (COZAAR) 25 MG tablet Take 1 tablet (25 mg total) by mouth daily. 02/11/22   Corky Crafts, MD  melatonin 5 MG TABS Take 5 mg by mouth as needed for sleep. 09/23/21   [provider]  Misc. Devices MISC Please provide patient with insurance approved diabetic shoes. E11.65 Patient taking differently: 1 each by Other route See admin instructions. Please provide patient with insurance approved diabetic shoes. E11.65 01/14/20   Claiborne Rigg, NP  Multiple Vitamin (MULTIVITAMIN WITH MINERALS) TABS tablet Take 1 tablet by mouth daily. 09/15/21   Dahal, Melina Schools, MD  nicotine (NICODERM CQ - DOSED IN MG/24 HOURS) 14 mg/24hr patch Place 1 patch (14 mg total) onto the skin daily. 01/08/22   Corky Crafts, MD  pantoprazole (PROTONIX) 40 MG tablet Take 1 tablet (40 mg total) by mouth daily. 09/15/21   Dahal, Melina Schools, MD  polyethylene glycol (MIRALAX / GLYCOLAX) 17 g packet Take 17 g by mouth daily as needed (constipation).    [provider]  senna (SENOKOT) 8.6 MG TABS tablet TAKE 1 TABLET BY MOUTH DAILY AS NEEDED FOR CONSTIPATION 01/30/21   Georganna Skeans, MD  triamcinolone cream (KENALOG) 0.1 % Apply 1 application topically 2 (two) times daily. Patient taking differently:  Apply 1 application  topically 2 (two) times daily as needed (rash/irritation). 03/16/21   Rema Fendt, NP      Allergies    Bee venom, Penicillins, and Clonidine derivatives    Review of Systems   Review of Systems  Respiratory:  Negative for shortness of breath.   Cardiovascular:  Negative for chest pain.  Gastrointestinal:  Negative for abdominal pain, nausea and vomiting.  Neurological:  Positive for light-headedness.  All other systems reviewed and are negative.   Physical Exam Updated Vital Signs BP (!) 149/89   Resp 18   Ht 5\' 5"  (1.651 m)   Wt 85.7 kg   BMI 31.45 kg/m  Physical Exam Vitals and nursing note reviewed.  Constitutional:      General: He is not in acute distress.    Appearance: He is not ill-appearing, toxic-appearing or diaphoretic.  HENT:     Head: Normocephalic and atraumatic.     Mouth/Throat:     Mouth:  Mucous membranes are moist.  Eyes:     Extraocular Movements: Extraocular movements intact.     Conjunctiva/sclera: Conjunctivae normal.     Pupils: Pupils are equal, round, and reactive to light.  Cardiovascular:     Rate and Rhythm: Normal rate and regular rhythm.  Pulmonary:     Effort: Pulmonary effort is normal.     Breath sounds: Normal breath sounds. No wheezing.  Abdominal:     General: Abdomen is flat.     Tenderness: There is no abdominal tenderness.  Musculoskeletal:     Left Lower Extremity: Left leg is amputated below knee.  Skin:    General: Skin is warm and dry.     Capillary Refill: Capillary refill takes less than 2 seconds.  Neurological:     Mental Status: He is alert and oriented to person, place, and time.     ED Results / Procedures / Treatments   Labs (all labs ordered are listed, but only abnormal results are displayed) Labs Reviewed  CBC - Abnormal; Notable for the following components:      Result Value   WBC 14.1 (*)    Hemoglobin 12.3 (*)    All other components within normal limits  COMPREHENSIVE  METABOLIC PANEL - Abnormal; Notable for the following components:   Potassium 5.3 (*)    Chloride 112 (*)    CO2 15 (*)    Glucose, Bld 163 (*)    BUN 54 (*)    Creatinine, Ser 4.77 (*)    Calcium 8.8 (*)    Albumin 3.4 (*)    AST 10 (*)    GFR, Estimated 14 (*)    All other components within normal limits  CBG MONITORING, ED - Abnormal; Notable for the following components:   Glucose-Capillary 150 (*)    All other components within normal limits    EKG EKG Interpretation Date/Time:  Friday October 08 2022 09:52:05 EDT Ventricular Rate:  84 PR Interval:  146 QRS Duration:  89 QT Interval:  357 QTC Calculation: 422 R Axis:   76  Text Interpretation: Sinus rhythm Consider right atrial enlargement Confirmed by Alona Bene [phone]) on 10/08/2022 9:56:18 AM  Radiology No results found.  Procedures Procedures   Medications Ordered in ED Medications  sodium zirconium cyclosilicate (LOKELMA) packet 10 g (10 g Oral Given 10/08/22 1159)    ED Course/ Medical Decision Making/ A&P Clinical Course as of 10/08/22 1204  Fri Oct 08, 2022  1013 EKG 12-Lead [CG]  1120 5G of Lokelma, dc and have him follow up with nephrology for lab recheck [CG]  1125 High 3s low 4s last creatinine check with nephrology. [CG]  1126 650 2x daily sodium bicarb indefinitely until seen by nephrology. [CG]    Clinical Course User Index [CG] Al Decant, PA-C    Medical Decision Making Amount and/or Complexity of Data Reviewed Labs: ordered. ECG/medicine tests: ordered. Decision-making details documented in ED Course.  Risk OTC drugs. Prescription drug management.   52 year old male presents to the ED for evaluation.  Please see HPI for further details.  On examination the patient is afebrile and nontachycardic.  His lung sounds are clear bilaterally and he is not hypoxic on room air.  Abdomen soft and compressible throughout.  Neurological examination at baseline.  Patient states lab  work this morning at PCPs office showed potassium 6.1.  We will collect lab work and EKG.  Patient CBC shows leukocytosis to 14.1, hemoglobin 12.3.  On chart review, it  appears the patient has chronically elevated white blood cell count.  Patient CBG 150.  Patient CMP shows potassium 5.3, creatinine 4.77, BUN 54, CO2 15.  Anion gap of 8.  Patient EKG shows no evidence of peaked T waves.  Patient case discussed with Dr. Signe Colt, nephrology, who has advised to give patient 10 g Lokelma and send him home on sodium bicarbonate 2 times daily 650 mg until seen by nephrology.  Dr. Signe Colt states that the patient creatinine has been slowly elevating and her last creatinine she has recorded for this patient was in the low fours so no acute change in terms of his creatinine today.  Patient advised to follow-up with nephrology and he voiced understanding.  Patient given 10 g Lokelma here.  He will be sent home with sodium bicarbonate which she will take 2 times daily at 650 mg.  He will return to the ED with any new or worsening signs or symptoms.  He was advised to follow-up with Dr. Signe Colt for lab recheck in the next 3 to 5 days and he voiced understanding.  All of his questions answered to satisfaction.  He is stable to discharge home.   Final Clinical Impression(s) / ED Diagnoses Final diagnoses:  Hyperkalemia    Rx / DC Orders ED Discharge Orders          Ordered    sodium bicarbonate 650 MG tablet  2 times daily        10/08/22 1157              Al Decant, [place] 10/08/22 1204    Long, Arlyss Repress, MD 10/13/22 1141

## 2022-10-08 NOTE — ED Triage Notes (Signed)
Pt present to ED with c/o hyperkalemia. Pt  states PCP called with result of high potassium. Pt denies chest pain, shortness of breath but c/o dizziness.

## 2022-10-08 NOTE — Discharge Instructions (Addendum)
It was a pleasure taking care of you today.  As we discussed, your potassium is only slightly elevated here at 5.3.  We gave you 10 g of a medication called Lokelma which should help to bring your potassium down.  I reached out to your nephrologist, Dr. Signe Colt, who advised to begin taking 650 mg of sodium bicarb 2 times daily until you see her.  I would like for you to follow-up with her and schedule an appointment to be seen when you leave here today.  Please also read the attached guide concerning hyperkalemia.  Please return to the ED with any new or worsening symptoms such as chest pain or shortness of breath.

## 2022-11-16 ENCOUNTER — Encounter (HOSPITAL_COMMUNITY): Payer: Self-pay | Admitting: Licensed Clinical Social Worker

## 2022-12-02 ENCOUNTER — Encounter: Payer: Self-pay | Admitting: Gastroenterology

## 2022-12-10 ENCOUNTER — Telehealth: Payer: Self-pay | Admitting: Physician Assistant

## 2022-12-10 NOTE — Telephone Encounter (Signed)
Left a message regarding scheduled appointment times/dates; left callback number for rescheduling if needed

## 2022-12-28 ENCOUNTER — Other Ambulatory Visit: Payer: Self-pay | Admitting: Physician Assistant

## 2022-12-28 DIAGNOSIS — D631 Anemia in chronic kidney disease: Secondary | ICD-10-CM

## 2022-12-28 DIAGNOSIS — D509 Iron deficiency anemia, unspecified: Secondary | ICD-10-CM

## 2022-12-29 ENCOUNTER — Inpatient Hospital Stay: Payer: No Typology Code available for payment source | Attending: Family Medicine

## 2022-12-29 ENCOUNTER — Inpatient Hospital Stay: Payer: No Typology Code available for payment source | Admitting: Physician Assistant

## 2023-01-31 ENCOUNTER — Other Ambulatory Visit: Payer: Self-pay | Admitting: *Deleted

## 2023-01-31 DIAGNOSIS — N186 End stage renal disease: Secondary | ICD-10-CM

## 2023-02-07 ENCOUNTER — Ambulatory Visit (HOSPITAL_COMMUNITY)
Admission: RE | Admit: 2023-02-07 | Discharge: 2023-02-07 | Disposition: A | Discharge status: Home / Self Care | Payer: Medicare HMO | Source: Ambulatory Visit | Attending: Surgery | Admitting: Surgery

## 2023-02-07 ENCOUNTER — Ambulatory Visit (INDEPENDENT_AMBULATORY_CARE_PROVIDER_SITE_OTHER): Payer: Medicare HMO | Admitting: Surgery

## 2023-02-07 ENCOUNTER — Encounter: Payer: Self-pay | Admitting: Surgery

## 2023-02-07 ENCOUNTER — Ambulatory Visit (INDEPENDENT_AMBULATORY_CARE_PROVIDER_SITE_OTHER)
Admission: RE | Admit: 2023-02-07 | Discharge: 2023-02-07 | Disposition: A | Discharge status: Home / Self Care | Payer: Medicare HMO | Source: Ambulatory Visit | Attending: Surgery | Admitting: Surgery

## 2023-02-07 VITALS — BP 127/82 | HR 86 | Temp 98.1°F | Resp 20 | Ht 65.0 in | Wt 190.0 lb

## 2023-02-07 DIAGNOSIS — N184 Chronic kidney disease, stage 4 (severe): Secondary | ICD-10-CM

## 2023-02-07 DIAGNOSIS — N186 End stage renal disease: Secondary | ICD-10-CM
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
## 2023-02-08 ENCOUNTER — Telehealth: Payer: Self-pay

## 2023-02-08 NOTE — Telephone Encounter (Signed)
Pt returned our call to schedule his AVF. He wants to hold off on scheduling at time, as he has an appt next month with nephrology and wants to discuss this first with her before proceeding. He has been advised to call us after that if he is ready to proceed with scheduling surgery.

## 2023-02-08 NOTE — Telephone Encounter (Signed)
Attempted to reach pt to schedule his LUE AVF. Left VM for him to call us back.

## 2023-09-22 ENCOUNTER — Other Ambulatory Visit: Payer: Self-pay | Admitting: Family Medicine

## 2023-09-22 DIAGNOSIS — R1900 Intra-abdominal and pelvic swelling, mass and lump, unspecified site: Secondary | ICD-10-CM

## 2023-10-03 ENCOUNTER — Other Ambulatory Visit

## 2024-01-11 ENCOUNTER — Inpatient Hospital Stay: Admitting: Physician Assistant

## 2024-01-11 ENCOUNTER — Inpatient Hospital Stay: Attending: Physician Assistant

## 2024-01-11 VITALS — BP 170/100 | HR 90 | Temp 97.2°F | Resp 16 | Wt 189.9 lb

## 2024-01-11 DIAGNOSIS — D72829 Elevated white blood cell count, unspecified: Secondary | ICD-10-CM | POA: Insufficient documentation

## 2024-01-11 DIAGNOSIS — R700 Elevated erythrocyte sedimentation rate: Secondary | ICD-10-CM | POA: Diagnosis not present

## 2024-01-11 DIAGNOSIS — F17210 Nicotine dependence, cigarettes, uncomplicated: Secondary | ICD-10-CM | POA: Insufficient documentation

## 2024-01-11 DIAGNOSIS — D649 Anemia, unspecified: Secondary | ICD-10-CM | POA: Insufficient documentation

## 2024-01-11 DIAGNOSIS — R229 Localized swelling, mass and lump, unspecified: Secondary | ICD-10-CM | POA: Diagnosis not present

## 2024-01-11 DIAGNOSIS — Z79899 Other long term (current) drug therapy: Secondary | ICD-10-CM | POA: Diagnosis not present

## 2024-01-11 LAB — CMP (CANCER CENTER ONLY)
ALT: 13 U/L (ref 0–44)
AST: 11 U/L — ABNORMAL LOW (ref 15–41)
Albumin: 3.9 g/dL (ref 3.5–5.0)
Alkaline Phosphatase: 107 U/L (ref 38–126)
Anion gap: 6 (ref 5–15)
BUN: 41 mg/dL — ABNORMAL HIGH (ref 6–20)
CO2: 21 mmol/L — ABNORMAL LOW (ref 22–32)
Calcium: 9.4 mg/dL (ref 8.9–10.3)
Chloride: 111 mmol/L (ref 98–111)
Creatinine: 3.85 mg/dL — ABNORMAL HIGH (ref 0.61–1.24)
GFR, Estimated: 18 mL/min — ABNORMAL LOW (ref >60)
Glucose, Bld: 146 mg/dL — ABNORMAL HIGH (ref 70–99)
Potassium: 4.9 mmol/L (ref 3.5–5.1)
Sodium: 138 mmol/L (ref 135–145)
Total Bilirubin: 0.3 mg/dL (ref 0.0–1.2)
Total Protein: 7.7 g/dL (ref 6.5–8.1)

## 2024-01-11 LAB — CBC WITH DIFFERENTIAL (CANCER CENTER ONLY)
Abs Immature Granulocytes: 0.04 K/uL (ref 0.00–0.07)
Basophils Absolute: 0.1 K/uL (ref 0.0–0.1)
Basophils Relative: 1 %
Eosinophils Absolute: 0.3 K/uL (ref 0.0–0.5)
Eosinophils Relative: 3 %
HCT: 40.1 % (ref 39.0–52.0)
Hemoglobin: 12.9 g/dL — ABNORMAL LOW (ref 13.0–17.0)
Immature Granulocytes: 0 %
Lymphocytes Relative: 22 %
Lymphs Abs: 2.4 K/uL (ref 0.7–4.0)
MCH: 28 pg (ref 26.0–34.0)
MCHC: 32.2 g/dL (ref 30.0–36.0)
MCV: 87.2 fL (ref 80.0–100.0)
Monocytes Absolute: 0.6 K/uL (ref 0.1–1.0)
Monocytes Relative: 5 %
Neutro Abs: 7.7 K/uL (ref 1.7–7.7)
Neutrophils Relative %: 69 %
Platelet Count: 259 K/uL (ref 150–400)
RBC: 4.6 MIL/uL (ref 4.22–5.81)
RDW: 15.2 % (ref 11.5–15.5)
WBC Count: 11 K/uL — ABNORMAL HIGH (ref 4.0–10.5)
nRBC: 0 % (ref 0.0–0.2)

## 2024-01-11 LAB — FOLATE: Folate: 6.2 ng/mL (ref >5.9)

## 2024-01-11 LAB — IRON AND IRON BINDING CAPACITY (CC-WL,HP ONLY)
Iron: 55 ug/dL (ref 45–182)
Saturation Ratios: 19 % (ref 17.9–39.5)
TIBC: 291 ug/dL (ref 250–450)
UIBC: 236 ug/dL (ref 117–376)

## 2024-01-11 LAB — VITAMIN B12: Vitamin B-12: 400 pg/mL (ref 180–914)

## 2024-01-11 LAB — SEDIMENTATION RATE: Sed Rate: 31 mm/h — ABNORMAL HIGH (ref 0–16)

## 2024-01-11 LAB — C-REACTIVE PROTEIN: CRP: 0.7 mg/dL (ref <1.0)

## 2024-01-11 LAB — FERRITIN: Ferritin: 285 ng/mL (ref 24–336)
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
## 2024-01-12 LAB — KAPPA/LAMBDA LIGHT CHAINS
Kappa free light chain: 110.9 mg/L — ABNORMAL HIGH (ref 3.3–19.4)
Kappa, lambda light chain ratio: 1.65 (ref 0.26–1.65)
Lambda free light chains: 67.2 mg/L — ABNORMAL HIGH (ref 5.7–26.3)

## 2024-01-13 LAB — ERYTHROPOIETIN: Erythropoietin: 10.8 m[IU]/mL (ref 2.6–18.5)

## 2024-01-13 NOTE — Patient Instructions (Signed)
 Dear Jerona JINNY Fake,   Congratulations for your interest in quitting smoking!  Find a program that suits you best: when you want to quit, how you need support, where you live, and how you like to learn.    If you're ready to get started TODAY, consider scheduling a visit through [email or web address].  Appointments are available from 8am to 8pm, Monday to Friday.   Most health insurance plans will cover some level of tobacco cessation visits and medications.    Additional Resources: [employer] are also available to help you quit & provide the support you'll need. Many programs are available in both [place] and Spanish and have a long history of successfully helping people get off and stay off tobacco.    Quit Smoking Apps:  quitSTART at [employer]iousBroker.de QuitGuide?at ForgetParking.dk Online education and resources: Smokefree  at [employer].gov Free Telephone Coaching: QuitNow,  Call 1-800-QUIT-NOW (458-354-8407) or Text- Ready to 949-234-6289 *Quitline [place] has teamed up with Medicaid to offer a free 14 week program    Vaping- Want to Quit? Free 24/7 support. Call [facility]  [place], [place], [place], [place], [place]  [facility]

## 2024-01-16 LAB — MULTIPLE MYELOMA PANEL, SERUM
Albumin SerPl Elph-Mcnc: 3.1 g/dL (ref 2.9–4.4)
Albumin/Glob SerPl: 0.9 (ref 0.7–1.7)
Alpha 1: 0.2 g/dL (ref 0.0–0.4)
Alpha2 Glob SerPl Elph-Mcnc: 0.7 g/dL (ref 0.4–1.0)
B-Globulin SerPl Elph-Mcnc: 1 g/dL (ref 0.7–1.3)
Gamma Glob SerPl Elph-Mcnc: 1.8 g/dL (ref 0.4–1.8)
Globulin, Total: 3.7 g/dL (ref 2.2–3.9)
IgA: 250 mg/dL (ref 90–386)
IgG (Immunoglobin G), Serum: 1857 mg/dL — ABNORMAL HIGH (ref 603–1613)
IgM (Immunoglobulin M), Srm: 92 mg/dL (ref 20–172)
Total Protein ELP: 6.8 g/dL (ref 6.0–8.5)

## 2024-01-16 LAB — BCR-ABL1 FISH
Cells Analyzed: 200
Cells Counted: 200

## 2024-01-18 LAB — JAK2 V617F RFX CALR/MPL/E12-15

## 2024-01-18 LAB — CALR +MPL + E12-E15  (REFLEX)

## 2024-01-26 ENCOUNTER — Telehealth: Payer: Self-pay | Admitting: Physician Assistant

## 2024-01-26 NOTE — Telephone Encounter (Signed)
 I called [name] to review the lab results form 01/11/2024. Findings show WBC and Hgb have improved and nearly back to normal. Sed rate was mildly elevated to suggestive inflammatory process. No evidence of nutritional deficiencies. MPN panel and BCR/ABL FISH were normal. SPEP/IFE showed no evidence of monoclonal protein. No further hematological workup recommended. Monitor for now and return in 6 months to repeat labs.
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
## 2024-04-11 ENCOUNTER — Ambulatory Visit: Attending: Family

## 2024-04-11 DIAGNOSIS — R2681 Unsteadiness on feet: Secondary | ICD-10-CM | POA: Insufficient documentation

## 2024-04-11 DIAGNOSIS — R208 Other disturbances of skin sensation: Secondary | ICD-10-CM | POA: Insufficient documentation

## 2024-04-11 DIAGNOSIS — M6281 Muscle weakness (generalized): Secondary | ICD-10-CM | POA: Insufficient documentation

## 2024-04-11 DIAGNOSIS — R2689 Other abnormalities of gait and mobility: Secondary | ICD-10-CM | POA: Insufficient documentation

## 2024-04-11 DIAGNOSIS — R293 Abnormal posture: Secondary | ICD-10-CM | POA: Insufficient documentation

## 2024-04-11 DIAGNOSIS — Z9181 History of falling: Secondary | ICD-10-CM | POA: Insufficient documentation

## 2024-04-11 NOTE — Therapy (Signed)
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 throughout home  []  willing and motivated to use  []  lower seat height required to foot propel  []  short stature   []  High strength lightweight manual  wheelchair (Breezy Ultra 4)  K0004     Arm:    []  both []  right  []  left     Foot:   []  both []  right   []  left                                                                  []  hemi height required []  medical condition and weight of wheelchair affect ability to self propel while engaging in frequent MRADL(s) that cannot be performed in a standard or lightweight manual wheelchair  []  daily use _________hours  []  chair fits throughout home  []  willing and motivated to use  []  prevent repetitive use injuries   []  lower seat height required to foot propel  []  short stature    []  Ultra-lightweight manual wheelchair  K0005     Arm:    []  both []  right  []  left     Foot:   []  both []  right  []  left       []  hemi height required  []  heavy duty  Front seat to floor _____ inches      Rear seat to floor _____ inches      Back height _____ inches     Back angle ______ degrees      Front angle _____ degrees  []   full-time manual wheelchair user  []  Requires individualized fitting and optimal adjustments for multiple features that include adjustable axle configuration, fully  adjustable center of gravity, wheel camber, seat and back angle, angle of seat slope, which cannot be accommodated by a K0001 through K0004 manual wheelchair  []  prevent repetitive use injuries  []  daily use_________hours   []  user has high activity patterns that frequently require  them  to go out into the community for the purpose of independently accomplishing high level MRADL activities. Examples of these might include a combination of; shopping, work, school, photographer, childcare, independently loading and unloading from a vehicle etc.  []  lower seat height required to foot propel  []  short stature  []  heavy duty -  weight over 250lbs   []  Current chair is a K0005   manufacture:___________________  model:_________________  serial#____________________  age:_________    []  First time X9994 user (complete trial)  K0004 time and # of strokes to propel 30 feet: ________seconds _________strokes  X9994 time and # of strokes to propel 30 feet: ________seconds _________strokes  What was the result of the trial between the K0004 and K0005 manual wheelchair? ___    What features of the K0005 w/c are needed as compared to the K0004 base? Why?___    []  adjustable seat and back angle changes the angle of seat slope of the frame to attain a gravity assisted position for efficient propulsion and proper weight distribution along the frame     []  the front of the wheelchair will be configured higher than the back of the chair to allow gravity to assist the user with postural stability  []  the center of the wheel will be positioned for stability, safety and efficient propulsion  []  adjustable axle allows for vertical, horizontal, camber and overall width changes  throughout the wheels for adjustment of the client's exact needs and abilities.   []  adjustable axle increases the stability and function of the chair allowing for adjustment of the center of gravity.   []  accommodates the client's anatomical  position in the chair maximizing independence in mobility and maneuverability in all environments.   []  create a minimal fixed tilt-in space to assist in positioning.   []  Describe users full-time manual wheelchair activity patterns:___    []  Power assist Comments:  []  prevent repetitive use injuries  []  repetitive strain injury present in    shoulder girdle    []  shoulder pain is (> or =) to 7/10     during manual propulsion       Current Pain _____/10  []  requires conservation of energy to participate in MRADL(s) runable to propel up ramps or curbs using manual wheelchair  []  been K0005 user greater than one year  []  user unwilling to use power      wheelchair (reason): []  less expensive option to power   wheelchair   []  rim activated power assist -      decreased strength   []  Heavy duty manual wheelchair       K0006     Arm:    []  both []  right  []  left     Foot:   []  both []  right  []  left     []   hemi height required    []  Dependent base  []  user exceeds 250lbs  []  non-functional ambulator    []  extreme spasticity  []  over active movement   []  broken frame/hx of repeated     repairs  []  able to self-propel in residence       []  lower seat to floor height required  []  unable to self-propel in residence   []  Extra heavy duty manual wheelchair  K0007     Arm:    []  both []  right  []  left     Foot:   []  both []  right  []  left     []  hemi height required  []  Dependent base  []  user exceeds 300lbs  []  non-functional ambulator    []  able to self-propel in residence   []  lower seat to floor height required  []  unable to self-propel in residence     []  Manual wheelchair with tilt [phone]      (Manual Tilt-n-Space)  []  patient is dependent for transfers  []  patient requires frequent       positioning for pressure relief   []  patient requires frequent      positioning for poor/absent trunk control        []  Stroller Base  []  infant/child   []  unable to propel manual       wheelchair  []  allows for growth  []  non-functional ambulator  []  non-functional UE  []  independent mobility is not a goal at this time    MANUAL FRAME OPTIONS      Push handles  []  extended   []  angle adjustable   []  standard  []  caregiver access  []  caregiver assist    []  allows hooking to enable      increased ability to perform ADLs or maintain balance   []  Angle Adjustable Back  []  postural control  []  control of tone/spasticity  []  accommodation of range of motion  []  UE functional control  []  accommodation for seating system    Rear wheel placement  []  std/fixed  [] fully adjustableramputee   []  camber ________degree  []  removable rear wheel  []  non-removable rear wheel  Wheel size _______  Wheel style_______________________  []  improved UE access to wheels  []  increase propulsion ability  []  improved stability  []  changing angle in space for      improvement of postural stability  []  remove for transport    []  allow for seating system to fit on  base  []  amputee placement  []  1-arm drive access   r R  r L  []  enable propulsion of manual       wheelchair with one arm    []  amputee placement   Wheel rims/ Hand rims  []  Standard    []  Specialized-____ []  provide ability to propel manual   []  increase self-propulsion with hand wheelchair weakness/decreased grasp     []  Spoke protector/guard   []  prevent hands from getting caught in spokes   Tires:  []  pneumatic  []  flat free inserts  []  solid  Style:  []  decrease roll resistance              []  prevent frequent flats  []  increase shock absorbency  []  decrease maintenance   []  decrease pain from road shock    []  decrease spasms from road shock    Wheel Locks:    []  push []  pull []  scissor  []  lock wheels for transfers  []   lock wheels from rolling   Brake/wheel lock extension:  []  R  []  L  []  allow user to operate wheel locks due to decreased reach or strength   Caster housing:  Caster size:                       Style:                                          []  suspension fork  []  maneuverability   []  stability of wheelchair   []  durability  []  maintenance  []  angle adjustment for posture  []  allow for feet to come under        wheelchair base  []  allows change in seat to floor  height   []  increase shock absorbency  []  decrease pain from road shock  []  decrease spasms from road    shock   []  Side guards  []  prevent clothing getting caught in wheel or becoming soiled   [] provide hip and pelvic stability  []  eliminates contact between body and wheels  []  limit hand contact with wheels   []  Anti-tippers      []  prevent wheelchair from tipping    backward  []  assist caregiver with curbs     POWER MOBILITY      []  Scooter/POV    []  can safely operate   []  can safely transfer   []  has adequate trunk stability   []  cannot functionally propel  manual wheelchair    [x]  Power mobility base    [x]  non-ambulatory   [x]  cannot functionally propel manual wheelchair   [x]  cannot functionally and safely      operate scooter/POV  [x]  can safely operate power       wheelchair  [x]  home is accessible  [x]  willing to use power wheelchair     Tilt  [x]  Powered tilt on powered chair  []  Powered tilt on manual chair  []  Manual tilt on manual chair Comments:  [x]  change position for pressure      []  elief/cannot weight shift   [x]  change position against      gravitational force on head and      shoulders   [x]  decrease pain  [x]  blood pressure management   []  control autonomic dysreflexia  [x]  decrease respiratory distress  []  management of spasticity  []  management of low tone  [x]  facilitate postural control   [x]  rest periods   [x]  control edema  [x]  increase sitting tolerance   [x]  aid with transfers     Recline   []  Power recline on power chair  []  Manual recline on manual chair  Comments:    []  intermittent catheterization  []  manage spasticity  []  accommodate femur to back angle   []  change position for pressure relief/cannot weight shift rhigh risk of pressure sore development  []  tilt alone does not accomplish     effective pressure relief, maximum pressure relief achieved at -      _______ degrees tilt   _______ degrees recline   []  difficult to transfer to and from bed []  rest periods and sleeping in chair  []  repositioning for transfers  []  bring to full recline for ADL care  []  clothing/diaper changes in chair  []  gravity PEG tube feeding  []  head positioning  []  decrease pain  []   blood pressure management   []  control autonomic dysreflexia  []  decrease respiratory distress  []  user on ventilator     Elevator on mobility base  [x]  Power wheelchair  []  Scooter  [x]  increase Indep in transfers   [x]  increase Indep in ADLs    [x]  bathroom function and safety  [x]  kitchen/cooking function and safety  [x]  shopping  [x]  raise height for communication at standing level  [x]  raise height for eye contact which reduces cervical neck strain and pain  []  drive at raised height for safety and navigating crowds  []  Other:   []  Vertical position system  (anterior tilt)     (Drive locks-out)    []  Stand       (Drive enabled)  []  independent weight bearing  []  decrease joint contractures  []  decrease/manage spasticity  []  decrease/manage spasms  []  pressure distribution away from   scapula, sacrum, coccyx, and ischial tuberosity  []  increase digestion and elimination   []  access to counters and cabinets  []  increase reach  []  increase interaction with others at eye level, reduces neck strain  []  increase performance of       MRADL(s)      Power elevating legrest    []  Center mount (Single) 85-170 degrees       []  Standard (Pair) 100-170 degrees  []  position legs at 90 degrees, not available with std power ELR  []  center mount tucks into chair to decrease turning radius in home, not available with std power ELR  []  provide change in position for LE  []   elevate legs during recline    []  maintain placement of feet on      footplate  []  decrease edema  []  improve circulation  []  actuator needed to elevate legrest  []  actuator needed to articulate legrest preventing knees from flexing  []  Increase ground clearance over      curbs  []   STD (pair) independently                     elevate legrest   POWER WHEELCHAIR CONTROLS      Controls/input device  []  Expandable  [x]  Non-expandable  [x]  Proportional  [x]  Right Hand []  Left Hand  []  Non-proportional/switches/head-array  []  Electrical/proximity         []   Mechanical      Manufacturer:___________________   Type:________________________ [x]  provides access for controlling wheelchair  [x]  programming for accurate control  []  progressive disease/changing condition  []  required for alternative drive      controls       []  lacks motor control to operate  proportional drive control  []  unable to understand proportional controls  []  limited movement/strength  []  extraneous movement / tremors / ataxic / spastic       [x]  Upgraded electronics controller/harness    [x]  Single power (tilt or recline)   []  Expandable    []  Non-expandable plus   []  Multi-power (tilt, recline, power legrest, power seat lift, vertical positioning system, stand)  [x]  allows input device to communicate with drive motors  [x]  harness provides necessary connections between the controller, input device, and seat functions     [x]  needed in order to operate power seat functions through joystick/ input device  []  required for alternative drive controls     []  Enhanced display  []  required to connect all alternative drive controls   []  required for upgraded joystick      (lite-throw,  heavy duty, micro)  []  Allows user to see in which mode and drive the wheelchair is set; necessary for alternate controls       []  Upgraded tracking electronics  []  correct tracking when on uneven surfaces makes switch driving more  efficient and less fatiguing  []  increase safety when driving  []  increase ability to traverse thresholds    []  Safety / reset / mode switches     Type:    []  Used to change modes and stop the wheelchair when driving     [x]  Mount for joystick / input device/switches  [x]  swing away for access or transfers   [x]  attaches joystick / input device / switches to wheelchair   [x]  provides for consistent access  []  midline for optimal placement    []  Attendant controlled joystick plus     mount  []  safety  []  long distance driving  []  operation of seat functions  []  compliance with transportation regulations    [x]  Battery NF22 x 2 [x]  required to power (power assist / scooter/ power wc / other):   []  Power inverter (24V to 12V)  []  required for ventilator / respiratory equipment / other:     CHAIR OPTIONS MANUAL & POWER      Armrests   [x]  adjustable height []  removable  []  swing away []  fixed  [x]  flip back  []  reclining  [x]  full length pads []  desk []  tube arms []  gel pads  [x]  provide support with elbow at 90    [x]  remove/flip back/swing away for  transfers  [x]  provide support and positioning of upper body    [x]  allow to come closer to table top  []  remove for access to tables  []  provide support for w/c tray  [x]  change of height/angles for variable activities   []  Elbow support / Elbow stop  []  keep elbow positioned on arm pad  []  keep arms from falling off arm pad  during tilt and/or recline   Upper Extremity Support  []  Arm trough  []   R  []   L  Style:  []  swivel mount []  fixed mount   []  posterior hand support  []   tray  []  full tray  []  joystick cut out  []   R  []   L  Style:  []  decrease gravitational pull on      shoulders  []  provide support to increase UE  function  []  provide hand support in natural    position  []  position flaccid UE  []  decrease subluxation    []  decrease edema       []  manage spasticity   []  provide midline positioning  []  provide work  surface  []  placement for AAC/ Computer/ EADL       Hangers/ Legrests   []  ______ degree  []  Elevating []  articulating  []  swing away []  fixed []  lift off  []  heavy duty  []  adjustable knee angle  []  adjustable calf panel   []  longer extension tube              []  provide LE support  []  maintain placement of feet on      footplate   []  accommodate lower leg length  []  accommodate to hamstring       tightness  []  enable transfers  []  provide change in position for LE's  []  elevate legs during recline    []  decrease edema  []  durability  Foot support   [x]  footplate []  R []  L [x]  flip up           [x]  Depth adjustable   [x]  angle adjustable  []  foot board/one piece    [x]  provide foot support  []  accommodate to ankle ROM  []  allow foot to go under wheelchair base  []  enable transfers     []  Shoe holders  []  position foot    []  decrease / manage spasticity  []  control position of LE  []  stability    []  safety     []  Ankle strap/heel      loops  []  support foot on foot support  []  decrease extraneous movement  []  provide input to heel   []  protect foot     []  Amputee adapter []  R  []  L     Style:                  Size:  []  Provide support for stump/residual extremity    []  Transportation tie-down  []  to provide crash tested tie-down brackets    []  Crutch/cane holder    []  O2 holder    []  IV hanger   []  Ventilator tray/mount    []  stabilize accessory on wheelchair       Component  Justification     [x]  Seat cushion    Tru comfort skin protecting and positinoing   []  accommodate impaired sensation  [x]  decubitus ulcers present or history  []  unable to shift weight  [x]  increase pressure distribution  []  prevent pelvic extension  [x]  custom required off-the-shelf    seat cushion will not accommodate deformity  [x]  stabilize/promote pelvis alignment  [x]  stabilize/promote femur alignment  []  accommodate obliquity  []  accommodate multiple deformity  [x]   incontinent/accidents  [x]  low maintenance     []  seat mounts                 []  fixed []  removable  []  attach seat platform/cushion to wheelchair frame    []  Seat wedge    []  provide increased aggressiveness of seat shape to decrease sliding  down in the seat  []  accommodate ROM        []  Cover replacement   []  protect back or seat cushion  []  incontinent/accidents    []  Solid seat / insert    []  support cushion to prevent      hammocking  []  allows attachment of cushion to mobility base    []  Lateral pelvic/thigh/hip     support (Guides)     []  decrease abduction  []  accommodate pelvis  []  position upper legs  []  accommodate spasticity  []  removable for transfers     []  Lateral pelvic/thigh      supports mounts  []  fixed   []  swing-away   []  removable  []  mounts lateral pelvic/thigh supports     []  mounts lateral pelvic/thigh supports swing-away or removable for transfers    []  Medial thigh support (Pommel)  [] decrease adduction  [] accommodate ROM  []  remove for transfers   []  alignment      []  Medial thigh   []  fixed      support mounts      []  swing-away   []  removable  []  mounts medial thigh supports   []  Mounts medial supports swing- away or removable for transfers       Component  Justification   [x]  Back   Sport back     [  x] provide posterior trunk support []  facilitate tone  [x]  provide lumbar/sacral support []  accommodate deformity  [x]  support trunk in midline   []  custom required off-the-shelf back support will not accommodate deformity   []  provide lateral trunk support []  accommodate or decrease tone            []  Back mounts  []  fixed  []  removable  []  attach back rest/cushion to wheelchair frame   []  Lateral trunk      supports  []  R []  L  []  decrease lateral trunk leaning  []  accommodate asymmetry    []  contour for increased contact  []  safety    []  control of tone    []  Lateral trunk      supports mounts  []  fixed  []  swing-away   []  removable  []   mounts lateral trunk supports     []  Mounts lateral trunk supports swing-away or removable for transfers   []  Anterior chest      strap, vest     []  decrease forward movement of shoulder  []  decrease forward movement of trunk  []  safety/stability  []  added abdominal support  []  trunk alignment  []  assistance with shoulder control   []  decrease shoulder elevation    [x]  Headrest      [x]  provide posterior head support  []  provide posterior neck support  []  provide lateral head support  []  provide anterior head support  []  support during tilt and recline  []  improve feeding     []  improve respiration  []  placement of switches  []  safety    []  accommodate ROM   []  accommodate tone  []  improve visual orientation   [x]  Headrest           []  fixed [x]  removable []  flip down      Mounting hardware   []  swing-away laterals/switches  [x]  mount headrest   [x]  mounts headrest flip down or  removable for transfers  []  mount headrest swing-away laterals   []  mount switches     []  Neck Support    []  decrease neck rotation  []  decrease forward neck flexion   Pelvic Positioner    [x]  std hip belt          []  padded hip belt  []  dual pull hip belt  []  four point hip belt  [x]  stabilize tone  []  decrease falling out of chair  []  prevent excessive extension  []  special pull angle to control      rotation  []  pad for protection over boney   prominence  []  promote comfort    []  Essential needs        bag/pouch   []  medicines []  special food rorthotics []  clothing changes  []  diapers  []  catheter/hygiene []  ostomy supplies   The above equipment has a life- long use expectancy.  Growth and changes in medical and/or functional conditions would be the exceptions.   SUMMARY:    ASSESSMENT:  CLINICAL IMPRESSION: The patient is a 54 year old male evaluated for a power mobility device due to significant functional limitations resulting from multiple complex medical conditions, including type 2  diabetes, CKD stage III, left above-knee amputation, right foot amputation, neuromuscular disorder, history of pressure ulcers on the bilateral buttocks, diabetic foot ulcer, chronic back pain, glaucoma, hyperlipidemia, and decreased cardiopulmonary endurance related to CHF and CKD. Although previously provided with a prosthesis for his left AKA more than two years ago, the patient is unable  to utilize it because the absence of a right foot prevents him from achieving the necessary stability for standing or ambulation. He has also experienced a fall within the last six months, further demonstrating his inability to safely ambulate. The patient lives with his son but remains alone for 8-10 hours daily, requiring independent and reliable mobility to safely complete daily activities. His current standard manual wheelchair is no longer medically appropriate due to his limited endurance, upper-extremity weakness, and the age-related wear that has made propulsion increasingly difficult. As a result, he is unable to perform essential ADLs such as meal preparation, reaching household items, and bathroom grooming tasks without assistance. A Group 3 power wheelchair with power tilt and power elevating seat is medically necessary to provide adequate postural support, pressure relief management, independent mobility, and safe access to functional reach within his home environment. Power tilt is required for effective pressure redistribution to prevent recurrence of pressure ulcers, given his history and high-risk profile, while the power seat elevator is essential for safe and independent performance of reaching, transfers, and ADLs. This level of mobility technology is the most appropriate option to meet the patients complex medical needs and to maximize his independence, safety, and functional participation in daily life.  If you'd like, I can format this into a full Letter of Medical Necessity with headers, provider info,  and signature lines. Provide your feedback on [employer], ple   OBJECTIVE IMPAIRMENTS Abnormal gait, cardiopulmonary status limiting activity, decreased activity tolerance, decreased balance, decreased endurance, decreased mobility, difficulty walking, decreased ROM, decreased strength, hypomobility, increased muscle spasms, impaired flexibility, impaired sensation, improper body mechanics, postural dysfunction, prosthetic dependency , and pain.   ACTIVITY LIMITATIONS carrying, lifting, bending, standing, squatting, stairs, transfers, bathing, reach over head, hygiene/grooming, and locomotion level  PARTICIPATION LIMITATIONS: meal prep, cleaning, laundry, driving, shopping, and community activity  PERSONAL FACTORS Age, Time since onset of injury/illness/exacerbation, and 3+ comorbidities: CKD, CHF, L AKA, R foot ampuataion are also affecting patient's functional outcome.   REHAB POTENTIAL: Good  CLINICAL DECISION MAKING: Stable/uncomplicated  EVALUATION COMPLEXITY: High                                   GOALS: One time visit. No goals established.    PLAN: PT FREQUENCY: one time visit    Raj LOISE Blanch, PT 04/11/2024, 8:49 AM    I concur with the above findings and recommendations of the therapist:  Physician name printed:         Physician's signature:      Date:      "

## 2024-04-18 ENCOUNTER — Ambulatory Visit: Admitting: Physical Therapy

## 2024-04-18 VITALS — BP 149/86 | HR 86

## 2024-04-18 DIAGNOSIS — M6281 Muscle weakness (generalized): Secondary | ICD-10-CM

## 2024-04-18 DIAGNOSIS — Z9181 History of falling: Secondary | ICD-10-CM

## 2024-04-18 DIAGNOSIS — R208 Other disturbances of skin sensation: Secondary | ICD-10-CM

## 2024-04-18 DIAGNOSIS — R2681 Unsteadiness on feet: Secondary | ICD-10-CM

## 2024-04-18 DIAGNOSIS — R2689 Other abnormalities of gait and mobility: Secondary | ICD-10-CM

## 2024-04-18 DIAGNOSIS — R293 Abnormal posture: Secondary | ICD-10-CM
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
## 2024-04-24 ENCOUNTER — Ambulatory Visit: Admitting: Physical Therapy

## 2024-04-26 ENCOUNTER — Ambulatory Visit: Admitting: Physical Therapy

## 2024-05-01 ENCOUNTER — Ambulatory Visit: Admitting: Physical Therapy

## 2024-05-03 ENCOUNTER — Ambulatory Visit: Admitting: Physical Therapy

## 2024-05-03 ENCOUNTER — Telehealth: Payer: Self-pay | Admitting: Physical Therapy
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
## 2024-05-08 ENCOUNTER — Ambulatory Visit: Admitting: Physical Therapy

## 2024-05-10 ENCOUNTER — Ambulatory Visit: Admitting: Physical Therapy

## 2024-05-15 ENCOUNTER — Ambulatory Visit: Admitting: Physical Therapy

## 2024-05-17 ENCOUNTER — Ambulatory Visit: Admitting: Physical Therapy

## 2024-05-22 ENCOUNTER — Ambulatory Visit: Admitting: Physical Therapy

## 2024-05-24 ENCOUNTER — Ambulatory Visit: Admitting: Physical Therapy

## 2024-05-29 ENCOUNTER — Ambulatory Visit: Admitting: Physical Therapy

## 2024-05-31 ENCOUNTER — Ambulatory Visit: Admitting: Physical Therapy

## 2024-07-11 ENCOUNTER — Inpatient Hospital Stay

## 2024-07-11 ENCOUNTER — Inpatient Hospital Stay: Admitting: Physician Assistant

## 2024-08-21 ENCOUNTER — Ambulatory Visit: Admitting: Physician Assistant
